# Patient Record
Sex: Female | Born: 1965
Health system: Southern US, Community
[De-identification: ages and names within clinical notes are randomized; demographics above are authoritative.]

## PROBLEM LIST (undated history)

## (undated) DIAGNOSIS — F329 Major depressive disorder, single episode, unspecified: Secondary | ICD-10-CM

## (undated) DIAGNOSIS — E118 Type 2 diabetes mellitus with unspecified complications: Secondary | ICD-10-CM

## (undated) DIAGNOSIS — E1165 Type 2 diabetes mellitus with hyperglycemia: Secondary | ICD-10-CM

## (undated) DIAGNOSIS — S52532A Colles' fracture of left radius, initial encounter for closed fracture: Secondary | ICD-10-CM

## (undated) DIAGNOSIS — J9621 Acute and chronic respiratory failure with hypoxia: Secondary | ICD-10-CM

## (undated) DIAGNOSIS — B20 Human immunodeficiency virus [HIV] disease: Secondary | ICD-10-CM

## (undated) DIAGNOSIS — R0602 Shortness of breath: Secondary | ICD-10-CM

## (undated) DIAGNOSIS — G709 Myoneural disorder, unspecified: Secondary | ICD-10-CM

## (undated) DIAGNOSIS — J449 Chronic obstructive pulmonary disease, unspecified: Secondary | ICD-10-CM

## (undated) DIAGNOSIS — M25552 Pain in left hip: Secondary | ICD-10-CM

## (undated) DIAGNOSIS — R197 Diarrhea, unspecified: Secondary | ICD-10-CM

## (undated) DIAGNOSIS — K219 Gastro-esophageal reflux disease without esophagitis: Secondary | ICD-10-CM

## (undated) DIAGNOSIS — F17200 Nicotine dependence, unspecified, uncomplicated: Secondary | ICD-10-CM

## (undated) DIAGNOSIS — M25559 Pain in unspecified hip: Secondary | ICD-10-CM

## (undated) DIAGNOSIS — R296 Repeated falls: Secondary | ICD-10-CM

## (undated) DIAGNOSIS — IMO0001 Reserved for inherently not codable concepts without codable children: Secondary | ICD-10-CM

## (undated) DIAGNOSIS — N951 Menopausal and female climacteric states: Secondary | ICD-10-CM

## (undated) DIAGNOSIS — M810 Age-related osteoporosis without current pathological fracture: Secondary | ICD-10-CM

## (undated) DIAGNOSIS — R5381 Other malaise: Secondary | ICD-10-CM

## (undated) DIAGNOSIS — E871 Hypo-osmolality and hyponatremia: Secondary | ICD-10-CM

## (undated) DIAGNOSIS — G4700 Insomnia, unspecified: Secondary | ICD-10-CM

## (undated) DIAGNOSIS — R52 Pain, unspecified: Secondary | ICD-10-CM

## (undated) DIAGNOSIS — R509 Fever, unspecified: Secondary | ICD-10-CM

## (undated) DIAGNOSIS — N179 Acute kidney failure, unspecified: Secondary | ICD-10-CM

## (undated) DIAGNOSIS — F419 Anxiety disorder, unspecified: Secondary | ICD-10-CM

## (undated) DIAGNOSIS — I10 Essential (primary) hypertension: Secondary | ICD-10-CM

## (undated) DIAGNOSIS — G609 Hereditary and idiopathic neuropathy, unspecified: Secondary | ICD-10-CM

## (undated) DIAGNOSIS — E1140 Type 2 diabetes mellitus with diabetic neuropathy, unspecified: Secondary | ICD-10-CM

## (undated) DIAGNOSIS — S72009A Fracture of unspecified part of neck of unspecified femur, initial encounter for closed fracture: Secondary | ICD-10-CM

## (undated) DIAGNOSIS — F39 Unspecified mood [affective] disorder: Secondary | ICD-10-CM

## (undated) DIAGNOSIS — E785 Hyperlipidemia, unspecified: Secondary | ICD-10-CM

## (undated) DIAGNOSIS — B370 Candidal stomatitis: Secondary | ICD-10-CM

## (undated) DIAGNOSIS — M1990 Unspecified osteoarthritis, unspecified site: Secondary | ICD-10-CM

## (undated) DIAGNOSIS — Z789 Other specified health status: Secondary | ICD-10-CM

## (undated) DIAGNOSIS — S7292XA Unspecified fracture of left femur, initial encounter for closed fracture: Secondary | ICD-10-CM

## (undated) DIAGNOSIS — N184 Chronic kidney disease, stage 4 (severe): Secondary | ICD-10-CM

## (undated) DIAGNOSIS — R5383 Other fatigue: Secondary | ICD-10-CM

## (undated) DIAGNOSIS — E11628 Type 2 diabetes mellitus with other skin complications: Secondary | ICD-10-CM

## (undated) DIAGNOSIS — I5189 Other ill-defined heart diseases: Secondary | ICD-10-CM

## (undated) DIAGNOSIS — S72002A Fracture of unspecified part of neck of left femur, initial encounter for closed fracture: Secondary | ICD-10-CM

## (undated) DIAGNOSIS — S72409A Unspecified fracture of lower end of unspecified femur, initial encounter for closed fracture: Secondary | ICD-10-CM

## (undated) DIAGNOSIS — E662 Morbid (severe) obesity with alveolar hypoventilation: Secondary | ICD-10-CM

## (undated) DIAGNOSIS — A419 Sepsis, unspecified organism: Secondary | ICD-10-CM

## (undated) DIAGNOSIS — J101 Influenza due to other identified influenza virus with other respiratory manifestations: Secondary | ICD-10-CM

## (undated) DIAGNOSIS — J189 Pneumonia, unspecified organism: Secondary | ICD-10-CM

## (undated) DIAGNOSIS — L089 Local infection of the skin and subcutaneous tissue, unspecified: Secondary | ICD-10-CM

## (undated) DIAGNOSIS — I214 Non-ST elevation (NSTEMI) myocardial infarction: Secondary | ICD-10-CM

## (undated) DIAGNOSIS — E1110 Type 2 diabetes mellitus with ketoacidosis without coma: Secondary | ICD-10-CM

## (undated) DIAGNOSIS — J45901 Unspecified asthma with (acute) exacerbation: Secondary | ICD-10-CM

## (undated) DIAGNOSIS — G9341 Metabolic encephalopathy: Secondary | ICD-10-CM

## (undated) DIAGNOSIS — F32A Depression, unspecified: Secondary | ICD-10-CM

## (undated) DIAGNOSIS — J40 Bronchitis, not specified as acute or chronic: Secondary | ICD-10-CM

## (undated) HISTORY — DX: Pain in unspecified hip: M25.559

## (undated) HISTORY — DX: Acute kidney failure, unspecified: N17.9

## (undated) HISTORY — DX: Other fatigue: R53.83

## (undated) HISTORY — DX: Acute and chronic respiratory failure with hypoxia: J96.21

## (undated) HISTORY — DX: Other malaise: R53.81

## (undated) HISTORY — DX: Local infection of the skin and subcutaneous tissue, unspecified: L08.9

## (undated) HISTORY — DX: Fever, unspecified: R50.9

## (undated) HISTORY — DX: Type 2 diabetes mellitus with hyperglycemia: E11.65

## (undated) HISTORY — DX: Other ill-defined heart diseases: I51.89

## (undated) HISTORY — DX: Type 2 diabetes mellitus with ketoacidosis without coma: E11.10

## (undated) HISTORY — DX: Menopausal and female climacteric states: N95.1

## (undated) HISTORY — DX: Pain in left hip: M25.552

## (undated) HISTORY — DX: Human immunodeficiency virus (HIV) disease: B20

## (undated) HISTORY — DX: Other specified health status: Z78.9

## (undated) HISTORY — DX: Unspecified fracture of lower end of unspecified femur, initial encounter for closed fracture: S72.409A

## (undated) HISTORY — DX: Essential (primary) hypertension: I10

## (undated) HISTORY — DX: Fracture of unspecified part of neck of unspecified femur, initial encounter for closed fracture: S72.009A

## (undated) HISTORY — DX: Type 2 diabetes mellitus with unspecified complications: E11.8

## (undated) HISTORY — DX: Hereditary and idiopathic neuropathy, unspecified: G60.9

## (undated) HISTORY — DX: Insomnia, unspecified: G47.00

## (undated) HISTORY — DX: Fracture of unspecified part of neck of left femur, initial encounter for closed fracture: S72.002A

## (undated) HISTORY — DX: Unspecified mood (affective) disorder: F39

## (undated) HISTORY — DX: Non-ST elevation (NSTEMI) myocardial infarction: I21.4

## (undated) HISTORY — DX: Unspecified asthma with (acute) exacerbation: J45.901

## (undated) HISTORY — DX: Metabolic encephalopathy: G93.41

## (undated) HISTORY — DX: Influenza due to other identified influenza virus with other respiratory manifestations: J10.1

## (undated) HISTORY — DX: Type 2 diabetes mellitus with other skin complications: E11.628

## (undated) HISTORY — DX: Unspecified fracture of left femur, initial encounter for closed fracture: S72.92XA

## (undated) HISTORY — DX: Morbid (severe) obesity with alveolar hypoventilation: E66.2

## (undated) HISTORY — DX: Depression, unspecified: F32.A

## (undated) HISTORY — DX: Type 2 diabetes mellitus with diabetic neuropathy, unspecified: E11.40

## (undated) HISTORY — DX: Repeated falls: R29.6

## (undated) HISTORY — DX: Hyperlipidemia, unspecified: E78.5

## (undated) HISTORY — DX: Age-related osteoporosis without current pathological fracture: M81.0

## (undated) HISTORY — DX: Nicotine dependence, unspecified, uncomplicated: F17.200

## (undated) HISTORY — DX: Sepsis, unspecified organism: A41.9

## (undated) HISTORY — DX: Major depressive disorder, single episode, unspecified: F32.9

## (undated) HISTORY — DX: Pneumonia, unspecified organism: J18.9

## (undated) HISTORY — DX: Colles' fracture of left radius, initial encounter for closed fracture: S52.532A

## (undated) HISTORY — DX: Bronchitis, not specified as acute or chronic: J40

## (undated) HISTORY — DX: Hypo-osmolality and hyponatremia: E87.1

## (undated) HISTORY — DX: Reserved for inherently not codable concepts without codable children: IMO0001

## (undated) HISTORY — DX: Shortness of breath: R06.02

## (undated) HISTORY — DX: Anxiety disorder, unspecified: F41.9

## (undated) HISTORY — DX: Diarrhea, unspecified: R19.7

## (undated) HISTORY — DX: Candidal stomatitis: B37.0

## (undated) HISTORY — DX: Chronic kidney disease, stage 4 (severe): N18.4

---

## 1997-05-03 ENCOUNTER — Ambulatory Visit (HOSPITAL_COMMUNITY): Admission: RE | Admit: 1997-05-03 | Discharge: 1997-05-03 | Payer: Self-pay | Admitting: Neurosurgery

## 1998-01-01 DIAGNOSIS — Z21 Asymptomatic human immunodeficiency virus [HIV] infection status: Secondary | ICD-10-CM

## 1998-01-01 DIAGNOSIS — B20 Human immunodeficiency virus [HIV] disease: Secondary | ICD-10-CM

## 1998-01-01 HISTORY — DX: Asymptomatic human immunodeficiency virus (hiv) infection status: Z21

## 1998-01-01 HISTORY — DX: Human immunodeficiency virus (HIV) disease: B20

## 1999-11-20 ENCOUNTER — Ambulatory Visit (HOSPITAL_COMMUNITY): Admission: RE | Admit: 1999-11-20 | Discharge: 1999-11-20 | Payer: Self-pay | Admitting: Infectious Diseases

## 1999-11-20 ENCOUNTER — Encounter: Admission: RE | Admit: 1999-11-20 | Discharge: 1999-11-20 | Payer: Self-pay | Admitting: Infectious Diseases

## 1999-11-20 ENCOUNTER — Encounter (INDEPENDENT_AMBULATORY_CARE_PROVIDER_SITE_OTHER): Payer: Self-pay | Admitting: *Deleted

## 1999-11-20 LAB — CONVERTED CEMR LAB
CD4 Count: 1150 microliters
CD4 T Cell Abs: 1150

## 1999-12-13 ENCOUNTER — Ambulatory Visit (HOSPITAL_COMMUNITY): Admission: RE | Admit: 1999-12-13 | Discharge: 1999-12-13 | Payer: Self-pay | Admitting: Infectious Diseases

## 1999-12-13 ENCOUNTER — Encounter: Admission: RE | Admit: 1999-12-13 | Discharge: 1999-12-13 | Payer: Self-pay | Admitting: Infectious Diseases

## 2000-03-06 ENCOUNTER — Encounter: Admission: RE | Admit: 2000-03-06 | Discharge: 2000-03-06 | Payer: Self-pay | Admitting: Infectious Diseases

## 2000-04-17 ENCOUNTER — Encounter: Admission: RE | Admit: 2000-04-17 | Discharge: 2000-04-17 | Payer: Self-pay | Admitting: Infectious Diseases

## 2000-04-17 ENCOUNTER — Ambulatory Visit (HOSPITAL_COMMUNITY): Admission: RE | Admit: 2000-04-17 | Discharge: 2000-04-17 | Payer: Self-pay | Admitting: Infectious Diseases

## 2000-06-17 ENCOUNTER — Encounter: Admission: RE | Admit: 2000-06-17 | Discharge: 2000-06-17 | Payer: Self-pay | Admitting: Internal Medicine

## 2000-11-04 ENCOUNTER — Encounter: Admission: RE | Admit: 2000-11-04 | Discharge: 2000-11-04 | Payer: Self-pay | Admitting: Infectious Diseases

## 2000-11-04 ENCOUNTER — Ambulatory Visit (HOSPITAL_COMMUNITY): Admission: RE | Admit: 2000-11-04 | Discharge: 2000-11-04 | Payer: Self-pay | Admitting: Infectious Diseases

## 2000-12-04 ENCOUNTER — Encounter: Admission: RE | Admit: 2000-12-04 | Discharge: 2000-12-04 | Payer: Self-pay | Admitting: Internal Medicine

## 2000-12-18 ENCOUNTER — Encounter: Admission: RE | Admit: 2000-12-18 | Discharge: 2000-12-18 | Payer: Self-pay | Admitting: Internal Medicine

## 2001-02-19 ENCOUNTER — Encounter: Admission: RE | Admit: 2001-02-19 | Discharge: 2001-02-19 | Payer: Self-pay | Admitting: Internal Medicine

## 2001-03-03 ENCOUNTER — Encounter: Admission: RE | Admit: 2001-03-03 | Discharge: 2001-03-03 | Payer: Self-pay | Admitting: Internal Medicine

## 2001-03-27 ENCOUNTER — Encounter: Admission: RE | Admit: 2001-03-27 | Discharge: 2001-03-27 | Payer: Self-pay | Admitting: Internal Medicine

## 2001-04-16 ENCOUNTER — Encounter: Admission: RE | Admit: 2001-04-16 | Discharge: 2001-04-16 | Payer: Self-pay | Admitting: Internal Medicine

## 2001-04-24 ENCOUNTER — Encounter: Admission: RE | Admit: 2001-04-24 | Discharge: 2001-04-24 | Payer: Self-pay | Admitting: Internal Medicine

## 2001-04-24 ENCOUNTER — Encounter: Payer: Self-pay | Admitting: Internal Medicine

## 2001-04-24 ENCOUNTER — Ambulatory Visit (HOSPITAL_COMMUNITY): Admission: RE | Admit: 2001-04-24 | Discharge: 2001-04-24 | Payer: Self-pay | Admitting: Internal Medicine

## 2001-10-15 ENCOUNTER — Encounter: Admission: RE | Admit: 2001-10-15 | Discharge: 2001-10-15 | Payer: Self-pay | Admitting: Internal Medicine

## 2001-11-05 ENCOUNTER — Encounter: Admission: RE | Admit: 2001-11-05 | Discharge: 2001-11-05 | Payer: Self-pay | Admitting: Infectious Diseases

## 2001-11-05 ENCOUNTER — Ambulatory Visit (HOSPITAL_COMMUNITY): Admission: RE | Admit: 2001-11-05 | Discharge: 2001-11-05 | Payer: Self-pay | Admitting: Infectious Diseases

## 2001-11-07 ENCOUNTER — Encounter: Admission: RE | Admit: 2001-11-07 | Discharge: 2001-11-07 | Payer: Self-pay | Admitting: Internal Medicine

## 2001-11-19 ENCOUNTER — Encounter: Admission: RE | Admit: 2001-11-19 | Discharge: 2001-11-19 | Payer: Self-pay | Admitting: Infectious Diseases

## 2002-01-08 ENCOUNTER — Encounter: Admission: RE | Admit: 2002-01-08 | Discharge: 2002-01-08 | Payer: Self-pay | Admitting: Internal Medicine

## 2002-07-14 ENCOUNTER — Encounter: Admission: RE | Admit: 2002-07-14 | Discharge: 2002-07-14 | Payer: Self-pay | Admitting: Internal Medicine

## 2002-08-06 ENCOUNTER — Ambulatory Visit (HOSPITAL_COMMUNITY): Admission: RE | Admit: 2002-08-06 | Discharge: 2002-08-06 | Payer: Self-pay | Admitting: Infectious Diseases

## 2002-08-06 ENCOUNTER — Encounter: Payer: Self-pay | Admitting: Infectious Diseases

## 2002-08-06 ENCOUNTER — Encounter: Admission: RE | Admit: 2002-08-06 | Discharge: 2002-08-06 | Payer: Self-pay | Admitting: Infectious Diseases

## 2002-08-20 ENCOUNTER — Encounter: Admission: RE | Admit: 2002-08-20 | Discharge: 2002-08-20 | Payer: Self-pay | Admitting: Infectious Diseases

## 2002-12-16 ENCOUNTER — Encounter: Payer: Self-pay | Admitting: Infectious Diseases

## 2002-12-16 ENCOUNTER — Ambulatory Visit (HOSPITAL_COMMUNITY): Admission: RE | Admit: 2002-12-16 | Discharge: 2002-12-16 | Payer: Self-pay | Admitting: Infectious Diseases

## 2002-12-16 ENCOUNTER — Encounter: Admission: RE | Admit: 2002-12-16 | Discharge: 2002-12-16 | Payer: Self-pay | Admitting: Infectious Diseases

## 2003-01-29 ENCOUNTER — Encounter: Admission: RE | Admit: 2003-01-29 | Discharge: 2003-01-29 | Payer: Self-pay | Admitting: Internal Medicine

## 2003-02-12 ENCOUNTER — Ambulatory Visit (HOSPITAL_COMMUNITY): Admission: RE | Admit: 2003-02-12 | Discharge: 2003-02-12 | Payer: Self-pay | Admitting: Internal Medicine

## 2003-03-03 ENCOUNTER — Encounter: Admission: RE | Admit: 2003-03-03 | Discharge: 2003-03-03 | Payer: Self-pay | Admitting: Internal Medicine

## 2003-03-17 ENCOUNTER — Ambulatory Visit (HOSPITAL_COMMUNITY): Admission: RE | Admit: 2003-03-17 | Discharge: 2003-03-17 | Payer: Self-pay | Admitting: Infectious Diseases

## 2003-03-17 ENCOUNTER — Encounter: Admission: RE | Admit: 2003-03-17 | Discharge: 2003-03-17 | Payer: Self-pay | Admitting: Infectious Diseases

## 2003-03-17 ENCOUNTER — Encounter (INDEPENDENT_AMBULATORY_CARE_PROVIDER_SITE_OTHER): Payer: Self-pay | Admitting: *Deleted

## 2003-03-17 LAB — CONVERTED CEMR LAB
CD4 Count: 680 microliters
HIV 1 RNA Quant: 8770 copies/mL

## 2003-03-18 ENCOUNTER — Encounter: Admission: RE | Admit: 2003-03-18 | Discharge: 2003-03-18 | Payer: Self-pay | Admitting: Infectious Diseases

## 2003-04-01 ENCOUNTER — Encounter: Admission: RE | Admit: 2003-04-01 | Discharge: 2003-04-01 | Payer: Self-pay | Admitting: Internal Medicine

## 2003-04-12 ENCOUNTER — Encounter: Admission: RE | Admit: 2003-04-12 | Discharge: 2003-04-12 | Payer: Self-pay | Admitting: Internal Medicine

## 2003-05-10 ENCOUNTER — Emergency Department (HOSPITAL_COMMUNITY): Admission: EM | Admit: 2003-05-10 | Discharge: 2003-05-10 | Payer: Self-pay | Admitting: Family Medicine

## 2003-11-04 ENCOUNTER — Ambulatory Visit: Payer: Self-pay | Admitting: Infectious Diseases

## 2003-11-04 ENCOUNTER — Ambulatory Visit (HOSPITAL_COMMUNITY): Admission: RE | Admit: 2003-11-04 | Discharge: 2003-11-04 | Payer: Self-pay | Admitting: Infectious Diseases

## 2003-11-04 ENCOUNTER — Encounter (INDEPENDENT_AMBULATORY_CARE_PROVIDER_SITE_OTHER): Payer: Self-pay | Admitting: *Deleted

## 2003-11-04 LAB — CONVERTED CEMR LAB
CD4 Count: 760 microliters
HIV 1 RNA Quant: 13900 copies/mL

## 2003-11-17 ENCOUNTER — Ambulatory Visit: Payer: Self-pay | Admitting: Infectious Diseases

## 2003-12-10 ENCOUNTER — Ambulatory Visit: Payer: Self-pay | Admitting: Infectious Diseases

## 2004-04-07 ENCOUNTER — Ambulatory Visit: Payer: Self-pay | Admitting: Internal Medicine

## 2004-04-10 ENCOUNTER — Ambulatory Visit: Payer: Self-pay | Admitting: Internal Medicine

## 2004-04-27 ENCOUNTER — Ambulatory Visit: Payer: Self-pay | Admitting: Internal Medicine

## 2004-06-14 ENCOUNTER — Ambulatory Visit: Payer: Self-pay | Admitting: Internal Medicine

## 2004-07-14 ENCOUNTER — Ambulatory Visit: Payer: Self-pay | Admitting: Infectious Diseases

## 2004-08-28 ENCOUNTER — Ambulatory Visit: Payer: Self-pay | Admitting: Internal Medicine

## 2005-08-01 ENCOUNTER — Encounter: Payer: Self-pay | Admitting: Internal Medicine

## 2005-08-01 LAB — CONVERTED CEMR LAB: Pap Smear: NEGATIVE

## 2006-01-28 ENCOUNTER — Ambulatory Visit: Payer: Self-pay | Admitting: Internal Medicine

## 2006-01-28 LAB — CONVERTED CEMR LAB
Hep B S Ab: NEGATIVE
Hepatitis B Surface Ag: NEGATIVE

## 2006-01-29 ENCOUNTER — Ambulatory Visit: Payer: Self-pay | Admitting: *Deleted

## 2006-02-25 ENCOUNTER — Ambulatory Visit: Payer: Self-pay | Admitting: Internal Medicine

## 2006-02-25 ENCOUNTER — Encounter (INDEPENDENT_AMBULATORY_CARE_PROVIDER_SITE_OTHER): Payer: Self-pay | Admitting: *Deleted

## 2006-02-25 LAB — CONVERTED CEMR LAB: Pap Smear: NORMAL

## 2006-03-04 ENCOUNTER — Ambulatory Visit: Payer: Self-pay | Admitting: Internal Medicine

## 2006-03-10 ENCOUNTER — Encounter (INDEPENDENT_AMBULATORY_CARE_PROVIDER_SITE_OTHER): Payer: Self-pay | Admitting: *Deleted

## 2006-08-22 ENCOUNTER — Encounter (INDEPENDENT_AMBULATORY_CARE_PROVIDER_SITE_OTHER): Payer: Self-pay | Admitting: *Deleted

## 2006-08-22 ENCOUNTER — Ambulatory Visit: Payer: Self-pay | Admitting: Internal Medicine

## 2006-08-22 LAB — CONVERTED CEMR LAB
Albumin: 4.5 g/dL (ref 3.5–5.2)
CD4 Count: 347 uL
CO2: 19 meq/L (ref 19–32)
Cholesterol: 330 mg/dL — ABNORMAL HIGH (ref 0–200)
Eosinophils Relative: 3 % (ref 0–5)
Glucose, Bld: 108 mg/dL — ABNORMAL HIGH (ref 70–99)
HCT: 37.3 % (ref 36.0–46.0)
HIV 1 RNA Quant: 41600 {copies}/mL
Hemoglobin: 13.1 g/dL (ref 12.0–15.0)
Lymphocytes Relative: 33 % (ref 12–46)
Lymphs Abs: 1.4 10*3/uL (ref 0.7–3.3)
Monocytes Relative: 9 % (ref 3–11)
Platelets: 207 10*3/uL (ref 150–400)
RBC: 4.12 M/uL (ref 3.87–5.11)
Sodium: 139 meq/L (ref 135–145)
Total Bilirubin: 0.3 mg/dL (ref 0.3–1.2)
Total Protein: 7.6 g/dL (ref 6.0–8.3)
Triglycerides: 1014 mg/dL — ABNORMAL HIGH (ref <150)
WBC: 4.2 10*3/uL (ref 4.0–10.5)

## 2006-09-18 ENCOUNTER — Encounter (INDEPENDENT_AMBULATORY_CARE_PROVIDER_SITE_OTHER): Payer: Self-pay | Admitting: *Deleted

## 2007-02-20 ENCOUNTER — Ambulatory Visit: Payer: Self-pay | Admitting: Internal Medicine

## 2007-03-10 ENCOUNTER — Encounter (INDEPENDENT_AMBULATORY_CARE_PROVIDER_SITE_OTHER): Payer: Self-pay | Admitting: *Deleted

## 2007-03-31 ENCOUNTER — Encounter: Payer: Self-pay | Admitting: Internal Medicine

## 2007-03-31 ENCOUNTER — Ambulatory Visit: Payer: Self-pay | Admitting: Internal Medicine

## 2007-03-31 LAB — CONVERTED CEMR LAB
Albumin: 4.5 g/dL (ref 3.5–5.2)
Alkaline Phosphatase: 56 units/L (ref 39–117)
BUN: 20 mg/dL (ref 6–23)
Eosinophils Absolute: 0.2 10*3/uL (ref 0.0–0.7)
Eosinophils Relative: 4 % (ref 0–5)
Glucose, Bld: 211 mg/dL — ABNORMAL HIGH (ref 70–99)
HCT: 36.6 % (ref 36.0–46.0)
HIV 1 RNA Quant: 132000 copies/mL — ABNORMAL HIGH (ref <50)
HIV-1 RNA Quant, Log: 5.12 — ABNORMAL HIGH (ref <1.70)
Lymphs Abs: 1 10*3/uL (ref 0.7–4.0)
MCV: 96.1 fL (ref 78.0–100.0)
Monocytes Relative: 10 % (ref 3–12)
RBC: 3.81 M/uL — ABNORMAL LOW (ref 3.87–5.11)
Total Bilirubin: 0.2 mg/dL — ABNORMAL LOW (ref 0.3–1.2)
Total Lymphocytes %: 23 % (ref 12–46)
Total lymphocyte count: 1012 cells/mcL (ref 700–3300)
WBC: 4.4 10*3/uL (ref 4.0–10.5)

## 2007-04-03 ENCOUNTER — Encounter (INDEPENDENT_AMBULATORY_CARE_PROVIDER_SITE_OTHER): Payer: Self-pay | Admitting: *Deleted

## 2007-04-03 ENCOUNTER — Encounter: Payer: Self-pay | Admitting: Internal Medicine

## 2007-04-03 ENCOUNTER — Ambulatory Visit: Payer: Self-pay | Admitting: Internal Medicine

## 2007-04-03 LAB — CONVERTED CEMR LAB
CD4 T Helper %: 7 % — ABNORMAL LOW (ref 32–62)
HIV-1 RNA Quant, Log: 5.32 — ABNORMAL HIGH (ref <1.70)
Total Lymphocytes %: 25 % (ref 12–46)
Total lymphocyte count: 925 cells/mcL (ref 700–3300)

## 2007-04-17 ENCOUNTER — Ambulatory Visit: Payer: Self-pay | Admitting: Internal Medicine

## 2007-05-22 ENCOUNTER — Ambulatory Visit: Payer: Self-pay | Admitting: Internal Medicine

## 2007-05-22 LAB — CONVERTED CEMR LAB
ALT: 12 units/L (ref 0–35)
AST: 15 units/L (ref 0–37)
Absolute CD4: 91 #/uL — ABNORMAL LOW (ref 381–1469)
Alkaline Phosphatase: 49 units/L (ref 39–117)
Basophils Absolute: 0 10*3/uL (ref 0.0–0.1)
Basophils Relative: 1 % (ref 0–1)
CD4 T Helper %: 13 % — ABNORMAL LOW (ref 32–62)
CO2: 18 meq/L — ABNORMAL LOW (ref 19–32)
Eosinophils Absolute: 0.2 10*3/uL (ref 0.0–0.7)
Eosinophils Relative: 3 % (ref 0–5)
GC Probe Amp, Urine: NEGATIVE
HCT: 35.4 % — ABNORMAL LOW (ref 36.0–46.0)
Lymphocytes Relative: 14 % (ref 12–46)
MCHC: 31.9 g/dL (ref 30.0–36.0)
Platelets: 205 10*3/uL (ref 150–400)
RDW: 15.1 % (ref 11.5–15.5)
Sodium: 130 meq/L — ABNORMAL LOW (ref 135–145)
Total Bilirubin: 0.3 mg/dL (ref 0.3–1.2)
Total Lymphocytes %: 14 % (ref 12–46)
Total Protein: 8.3 g/dL (ref 6.0–8.3)
Total lymphocyte count: 700 cells/mcL (ref 700–3300)
WBC, lymph enumeration: 5 10*3/uL (ref 4.0–10.5)

## 2007-06-05 ENCOUNTER — Ambulatory Visit: Payer: Self-pay | Admitting: Internal Medicine

## 2007-06-12 ENCOUNTER — Ambulatory Visit: Payer: Self-pay | Admitting: Internal Medicine

## 2007-06-26 ENCOUNTER — Ambulatory Visit: Payer: Self-pay | Admitting: Internal Medicine

## 2007-07-17 ENCOUNTER — Ambulatory Visit: Payer: Self-pay | Admitting: Internal Medicine

## 2007-08-14 ENCOUNTER — Encounter (INDEPENDENT_AMBULATORY_CARE_PROVIDER_SITE_OTHER): Payer: Self-pay | Admitting: *Deleted

## 2007-08-14 ENCOUNTER — Ambulatory Visit: Payer: Self-pay | Admitting: Internal Medicine

## 2007-08-14 ENCOUNTER — Encounter: Payer: Self-pay | Admitting: Internal Medicine

## 2007-08-14 LAB — CONVERTED CEMR LAB
ALT: 13 units/L (ref 0–35)
Albumin: 4.2 g/dL (ref 3.5–5.2)
Alkaline Phosphatase: 48 units/L (ref 39–117)
Basophils Absolute: 0 10*3/uL (ref 0.0–0.1)
CD4 Count: 129 microliters
CD4 T Helper %: 11 % — ABNORMAL LOW (ref 32–62)
CO2: 21 meq/L (ref 19–32)
Eosinophils Absolute: 0.2 10*3/uL (ref 0.0–0.7)
Glucose, Bld: 124 mg/dL — ABNORMAL HIGH (ref 70–99)
HIV-1 RNA Quant, Log: 2.12 — ABNORMAL HIGH (ref <1.70)
Lymphocytes Relative: 24 % (ref 12–46)
Lymphs Abs: 1.2 10*3/uL (ref 0.7–4.0)
Neutrophils Relative %: 65 % (ref 43–77)
Platelets: 279 10*3/uL (ref 150–400)
Potassium: 4.9 meq/L (ref 3.5–5.3)
RDW: 14.3 % (ref 11.5–15.5)
Sodium: 136 meq/L (ref 135–145)
Total Bilirubin: 0.2 mg/dL — ABNORMAL LOW (ref 0.3–1.2)
Total Protein: 7.5 g/dL (ref 6.0–8.3)
Total lymphocyte count: 1176 cells/mcL (ref 700–3300)
WBC: 4.9 10*3/uL (ref 4.0–10.5)

## 2007-08-28 ENCOUNTER — Ambulatory Visit: Payer: Self-pay | Admitting: Internal Medicine

## 2007-09-04 ENCOUNTER — Ambulatory Visit: Payer: Self-pay | Admitting: Internal Medicine

## 2007-10-09 ENCOUNTER — Ambulatory Visit: Payer: Self-pay | Admitting: Internal Medicine

## 2007-10-16 ENCOUNTER — Ambulatory Visit: Payer: Self-pay | Admitting: Internal Medicine

## 2007-10-23 ENCOUNTER — Ambulatory Visit: Payer: Self-pay | Admitting: Internal Medicine

## 2007-11-06 ENCOUNTER — Ambulatory Visit: Payer: Self-pay | Admitting: Internal Medicine

## 2007-11-26 ENCOUNTER — Ambulatory Visit: Payer: Self-pay | Admitting: Internal Medicine

## 2007-11-27 ENCOUNTER — Encounter: Payer: Self-pay | Admitting: Internal Medicine

## 2007-11-27 ENCOUNTER — Encounter (INDEPENDENT_AMBULATORY_CARE_PROVIDER_SITE_OTHER): Payer: Self-pay | Admitting: *Deleted

## 2007-11-27 LAB — CONVERTED CEMR LAB
AST: 11 units/L (ref 0–37)
Absolute CD4: 254 #/uL — ABNORMAL LOW (ref 381–1469)
Albumin: 4.2 g/dL (ref 3.5–5.2)
BUN: 20 mg/dL (ref 6–23)
CO2: 18 meq/L — ABNORMAL LOW (ref 19–32)
Calcium: 8.8 mg/dL (ref 8.4–10.5)
Chloride: 100 meq/L (ref 96–112)
Creatinine, Ser: 1.04 mg/dL (ref 0.40–1.20)
Glucose, Bld: 172 mg/dL — ABNORMAL HIGH (ref 70–99)
HIV 1 RNA Quant: <48 copies/mL (ref <48)
HIV-1 RNA Quant, Log: <1.68 (ref <1.68)
Hemoglobin: 11.7 g/dL — ABNORMAL LOW (ref 12.0–15.0)
Lymphocytes Relative: 26 % (ref 12–46)
Lymphs Abs: 1.6 10*3/uL (ref 0.7–4.0)
MCHC: 31 g/dL (ref 30.0–36.0)
Monocytes Absolute: 0.4 10*3/uL (ref 0.1–1.0)
Monocytes Relative: 6 % (ref 3–12)
Neutro Abs: 3.8 10*3/uL (ref 1.7–7.7)
Neutrophils Relative %: 63 % (ref 43–77)
Potassium: 4.6 meq/L (ref 3.5–5.3)
RBC: 4.03 M/uL (ref 3.87–5.11)
Total Lymphocytes %: 26 % (ref 12–46)
WBC, lymph enumeration: 6.1 10*3/uL (ref 4.0–10.5)
WBC: 6.1 10*3/uL (ref 4.0–10.5)

## 2007-12-04 ENCOUNTER — Ambulatory Visit: Payer: Self-pay | Admitting: Internal Medicine

## 2008-04-19 ENCOUNTER — Encounter (INDEPENDENT_AMBULATORY_CARE_PROVIDER_SITE_OTHER): Payer: Self-pay | Admitting: *Deleted

## 2008-04-19 ENCOUNTER — Ambulatory Visit: Payer: Self-pay | Admitting: Internal Medicine

## 2008-04-19 ENCOUNTER — Encounter: Payer: Self-pay | Admitting: Internal Medicine

## 2008-04-19 DIAGNOSIS — I10 Essential (primary) hypertension: Secondary | ICD-10-CM

## 2008-04-19 DIAGNOSIS — E118 Type 2 diabetes mellitus with unspecified complications: Secondary | ICD-10-CM

## 2008-04-19 DIAGNOSIS — E1159 Type 2 diabetes mellitus with other circulatory complications: Secondary | ICD-10-CM

## 2008-04-19 DIAGNOSIS — F39 Unspecified mood [affective] disorder: Secondary | ICD-10-CM

## 2008-04-19 DIAGNOSIS — G609 Hereditary and idiopathic neuropathy, unspecified: Secondary | ICD-10-CM

## 2008-04-19 DIAGNOSIS — IMO0002 Reserved for concepts with insufficient information to code with codable children: Secondary | ICD-10-CM

## 2008-04-19 DIAGNOSIS — B20 Human immunodeficiency virus [HIV] disease: Secondary | ICD-10-CM

## 2008-04-19 DIAGNOSIS — E1165 Type 2 diabetes mellitus with hyperglycemia: Secondary | ICD-10-CM

## 2008-04-19 HISTORY — DX: Type 2 diabetes mellitus with hyperglycemia: E11.65

## 2008-04-19 HISTORY — DX: Hereditary and idiopathic neuropathy, unspecified: G60.9

## 2008-04-19 HISTORY — DX: Unspecified mood (affective) disorder: F39

## 2008-04-19 HISTORY — DX: Human immunodeficiency virus (HIV) disease: B20

## 2008-04-19 HISTORY — DX: Reserved for concepts with insufficient information to code with codable children: IMO0002

## 2008-04-19 HISTORY — DX: Essential (primary) hypertension: I10

## 2008-04-19 LAB — CONVERTED CEMR LAB
ALT: 8 units/L (ref 0–35)
CD4 Count: 199 microliters
CO2: 23 meq/L (ref 19–32)
Calcium: 8.9 mg/dL (ref 8.4–10.5)
Chloride: 104 meq/L (ref 96–112)
Eosinophils Absolute: 0.2 10*3/uL (ref 0.0–0.7)
Glucose, Bld: 153 mg/dL — ABNORMAL HIGH (ref 70–99)
HIV 1 RNA Quant: 85 copies/mL — ABNORMAL HIGH (ref <48)
Hemoglobin: 10.6 g/dL — ABNORMAL LOW (ref 12.0–15.0)
Lymphs Abs: 1.4 10*3/uL (ref 0.7–4.0)
MCV: 92.2 fL (ref 78.0–100.0)
Monocytes Absolute: 0.5 10*3/uL (ref 0.1–1.0)
Monocytes Relative: 7 % (ref 3–12)
Neutro Abs: 5.7 10*3/uL (ref 1.7–7.7)
Neutrophils Relative %: 72 % (ref 43–77)
RBC: 3.7 M/uL — ABNORMAL LOW (ref 3.87–5.11)
Sodium: 140 meq/L (ref 135–145)
Total Protein: 7 g/dL (ref 6.0–8.3)
WBC: 7.9 10*3/uL (ref 4.0–10.5)

## 2008-05-11 ENCOUNTER — Telehealth: Payer: Self-pay | Admitting: Internal Medicine

## 2008-05-17 ENCOUNTER — Telehealth: Payer: Self-pay | Admitting: Internal Medicine

## 2008-05-19 ENCOUNTER — Encounter: Payer: Self-pay | Admitting: Internal Medicine

## 2008-05-20 ENCOUNTER — Encounter: Payer: Self-pay | Admitting: Internal Medicine

## 2008-05-20 ENCOUNTER — Ambulatory Visit: Payer: Self-pay | Admitting: Internal Medicine

## 2008-05-20 DIAGNOSIS — B370 Candidal stomatitis: Secondary | ICD-10-CM

## 2008-05-20 HISTORY — DX: Candidal stomatitis: B37.0

## 2008-06-22 ENCOUNTER — Telehealth: Payer: Self-pay | Admitting: Internal Medicine

## 2008-07-08 ENCOUNTER — Ambulatory Visit: Payer: Self-pay | Admitting: Internal Medicine

## 2008-07-08 ENCOUNTER — Encounter (INDEPENDENT_AMBULATORY_CARE_PROVIDER_SITE_OTHER): Payer: Self-pay | Admitting: *Deleted

## 2008-07-08 LAB — CONVERTED CEMR LAB
ALT: 12 units/L (ref 0–35)
Absolute CD4: 287 #/uL — ABNORMAL LOW (ref 381–1469)
BUN: 28 mg/dL — ABNORMAL HIGH (ref 6–23)
Basophils Relative: 1 % (ref 0–1)
CD4 T Helper %: 14 % — ABNORMAL LOW (ref 32–62)
CO2: 22 meq/L (ref 19–32)
Creatinine, Ser: 1.14 mg/dL (ref 0.40–1.20)
Eosinophils Absolute: 0.3 10*3/uL (ref 0.0–0.7)
Eosinophils Relative: 3 % (ref 0–5)
HIV 1 RNA Quant: 452 copies/mL — ABNORMAL HIGH (ref <48)
MCHC: 32.2 g/dL (ref 30.0–36.0)
MCV: 86.3 fL (ref 78.0–100.0)
Monocytes Relative: 5 % (ref 3–12)
Neutrophils Relative %: 68 % (ref 43–77)
Platelets: 433 10*3/uL — ABNORMAL HIGH (ref 150–400)
Total Bilirubin: 0.2 mg/dL — ABNORMAL LOW (ref 0.3–1.2)
Total lymphocyte count: 2047 cells/mcL (ref 700–3300)

## 2008-07-12 ENCOUNTER — Ambulatory Visit: Payer: Self-pay | Admitting: Internal Medicine

## 2008-07-12 ENCOUNTER — Encounter: Payer: Self-pay | Admitting: Internal Medicine

## 2008-07-12 DIAGNOSIS — R5383 Other fatigue: Secondary | ICD-10-CM

## 2008-07-12 DIAGNOSIS — R5381 Other malaise: Secondary | ICD-10-CM

## 2008-07-12 HISTORY — DX: Other malaise: R53.81

## 2008-07-19 ENCOUNTER — Telehealth: Payer: Self-pay | Admitting: Internal Medicine

## 2008-07-23 ENCOUNTER — Telehealth: Payer: Self-pay | Admitting: Internal Medicine

## 2008-08-23 ENCOUNTER — Encounter: Payer: Self-pay | Admitting: Internal Medicine

## 2008-08-23 ENCOUNTER — Encounter (INDEPENDENT_AMBULATORY_CARE_PROVIDER_SITE_OTHER): Payer: Self-pay | Admitting: *Deleted

## 2008-08-23 ENCOUNTER — Ambulatory Visit: Payer: Self-pay | Admitting: Internal Medicine

## 2008-08-23 LAB — CONVERTED CEMR LAB
ALT: 12 units/L (ref 0–35)
AST: 9 units/L (ref 0–37)
Absolute CD4: 310 #/uL — ABNORMAL LOW (ref 381–1469)
Alkaline Phosphatase: 81 units/L (ref 39–117)
Basophils Absolute: 0.1 10*3/uL (ref 0.0–0.1)
Basophils Relative: 1 % (ref 0–1)
CD4 Count: 310 microliters
CD4 T Helper %: 15 % — ABNORMAL LOW (ref 32–62)
Creatinine, Ser: 1.22 mg/dL — ABNORMAL HIGH (ref 0.40–1.20)
Eosinophils Absolute: 0.2 10*3/uL (ref 0.0–0.7)
HIV 1 RNA Quant: <48 copies/mL (ref <48)
MCHC: 33.5 g/dL (ref 30.0–36.0)
MCV: 83.5 fL (ref 78.0–100.0)
Monocytes Relative: 5 % (ref 3–12)
Neutro Abs: 5.8 10*3/uL (ref 1.7–7.7)
Neutrophils Relative %: 67 % (ref 43–77)
Platelets: 494 10*3/uL — ABNORMAL HIGH (ref 150–400)
RBC: 4.07 M/uL (ref 3.87–5.11)
RDW: 16.5 % — ABNORMAL HIGH (ref 11.5–15.5)
Sodium: 133 meq/L — ABNORMAL LOW (ref 135–145)
Total Bilirubin: 0.2 mg/dL — ABNORMAL LOW (ref 0.3–1.2)
Total Protein: 7.5 g/dL (ref 6.0–8.3)

## 2008-08-30 ENCOUNTER — Ambulatory Visit: Payer: Self-pay | Admitting: Internal Medicine

## 2008-08-30 ENCOUNTER — Encounter: Payer: Self-pay | Admitting: Internal Medicine

## 2008-08-30 DIAGNOSIS — R197 Diarrhea, unspecified: Secondary | ICD-10-CM

## 2008-08-30 HISTORY — DX: Diarrhea, unspecified: R19.7

## 2008-08-30 LAB — CONVERTED CEMR LAB: Blood Glucose, Fingerstick: 243

## 2008-10-19 ENCOUNTER — Telehealth: Payer: Self-pay | Admitting: Internal Medicine

## 2008-11-15 ENCOUNTER — Encounter: Payer: Self-pay | Admitting: Internal Medicine

## 2008-11-29 ENCOUNTER — Ambulatory Visit: Payer: Self-pay | Admitting: Internal Medicine

## 2008-11-29 ENCOUNTER — Encounter: Payer: Self-pay | Admitting: Internal Medicine

## 2008-11-29 DIAGNOSIS — J209 Acute bronchitis, unspecified: Secondary | ICD-10-CM

## 2008-11-29 DIAGNOSIS — J449 Chronic obstructive pulmonary disease, unspecified: Secondary | ICD-10-CM | POA: Insufficient documentation

## 2008-11-29 DIAGNOSIS — J440 Chronic obstructive pulmonary disease with acute lower respiratory infection: Secondary | ICD-10-CM

## 2008-12-03 ENCOUNTER — Emergency Department (HOSPITAL_COMMUNITY): Admission: EM | Admit: 2008-12-03 | Discharge: 2008-12-03 | Payer: Self-pay | Admitting: Emergency Medicine

## 2008-12-20 ENCOUNTER — Ambulatory Visit: Payer: Self-pay | Admitting: Internal Medicine

## 2008-12-20 ENCOUNTER — Encounter (INDEPENDENT_AMBULATORY_CARE_PROVIDER_SITE_OTHER): Payer: Self-pay | Admitting: *Deleted

## 2008-12-20 LAB — CONVERTED CEMR LAB
ALT: 15 units/L (ref 0–35)
AST: 14 units/L (ref 0–37)
Basophils Relative: 0 % (ref 0–1)
CD4 T Helper %: 15 %
Creatinine, Ser: 1.2 mg/dL (ref 0.40–1.20)
Eosinophils Absolute: 0.3 10*3/uL (ref 0.0–0.7)
HIV 1 RNA Quant: <48 copies/mL (ref <48)
HIV-1 RNA Quant, Log: <1.68 (ref <1.68)
Lymphs Abs: 2 10*3/uL (ref 0.7–4.0)
MCV: 84.7 fL (ref 78.0–100.0)
Monocytes Relative: 5 % (ref 3–12)
Neutro Abs: 6.1 10*3/uL (ref 1.7–7.7)
Neutrophils Relative %: 68 % (ref 43–77)
Platelets: 437 10*3/uL — ABNORMAL HIGH (ref 150–400)
RBC: 4.25 M/uL (ref 3.87–5.11)
Sodium: 134 meq/L — ABNORMAL LOW (ref 135–145)
Total Bilirubin: 0.2 mg/dL — ABNORMAL LOW (ref 0.3–1.2)
Total CHOL/HDL Ratio: 7.3
Total Protein: 7.3 g/dL (ref 6.0–8.3)
WBC: 8.9 10*3/uL (ref 4.0–10.5)

## 2008-12-23 ENCOUNTER — Encounter: Payer: Self-pay | Admitting: Internal Medicine

## 2008-12-23 ENCOUNTER — Ambulatory Visit: Payer: Self-pay | Admitting: Internal Medicine

## 2008-12-23 DIAGNOSIS — E1169 Type 2 diabetes mellitus with other specified complication: Secondary | ICD-10-CM

## 2008-12-23 DIAGNOSIS — E785 Hyperlipidemia, unspecified: Secondary | ICD-10-CM

## 2008-12-23 HISTORY — DX: Hyperlipidemia, unspecified: E78.5

## 2009-01-07 ENCOUNTER — Telehealth: Payer: Self-pay | Admitting: Internal Medicine

## 2009-01-18 ENCOUNTER — Telehealth: Payer: Self-pay | Admitting: Internal Medicine

## 2009-03-21 ENCOUNTER — Encounter (INDEPENDENT_AMBULATORY_CARE_PROVIDER_SITE_OTHER): Payer: Self-pay | Admitting: *Deleted

## 2009-03-21 ENCOUNTER — Ambulatory Visit: Payer: Self-pay | Admitting: Internal Medicine

## 2009-03-21 LAB — CONVERTED CEMR LAB
CD4 Count: 474 microliters
CD4 T Helper %: 18 %

## 2009-03-22 ENCOUNTER — Encounter: Payer: Self-pay | Admitting: Internal Medicine

## 2009-03-22 LAB — CONVERTED CEMR LAB
Absolute CD4: 474 #/uL (ref 381–1469)
BUN: 23 mg/dL (ref 6–23)
Basophils Relative: 1 % (ref 0–1)
CO2: 21 meq/L (ref 19–32)
Cholesterol: 307 mg/dL — ABNORMAL HIGH (ref 0–200)
Eosinophils Absolute: 0.4 10*3/uL (ref 0.0–0.7)
Eosinophils Relative: 4 % (ref 0–5)
Glucose, Bld: 141 mg/dL — ABNORMAL HIGH (ref 70–99)
HCT: 36 % (ref 36.0–46.0)
HIV-1 RNA Quant, Log: <1.68 (ref <1.68)
Hemoglobin: 11.9 g/dL — ABNORMAL LOW (ref 12.0–15.0)
MCHC: 33.1 g/dL (ref 30.0–36.0)
MCV: 88.2 fL (ref 78.0–100.0)
Monocytes Absolute: 0.7 10*3/uL (ref 0.1–1.0)
Monocytes Relative: 7 % (ref 3–12)
Neutro Abs: 5.7 10*3/uL (ref 1.7–7.7)
RBC: 4.08 M/uL (ref 3.87–5.11)
Sodium: 131 meq/L — ABNORMAL LOW (ref 135–145)
Total Bilirubin: 0.2 mg/dL — ABNORMAL LOW (ref 0.3–1.2)
Total Protein: 7.7 g/dL (ref 6.0–8.3)
Total lymphocyte count: 2632 cells/mcL (ref 700–3300)
Triglycerides: 662 mg/dL — ABNORMAL HIGH (ref <150)

## 2009-03-24 ENCOUNTER — Ambulatory Visit: Payer: Self-pay | Admitting: Internal Medicine

## 2009-03-24 ENCOUNTER — Encounter: Payer: Self-pay | Admitting: Internal Medicine

## 2009-04-07 ENCOUNTER — Ambulatory Visit: Payer: Self-pay | Admitting: Internal Medicine

## 2009-05-23 ENCOUNTER — Ambulatory Visit: Payer: Self-pay | Admitting: Internal Medicine

## 2009-06-01 ENCOUNTER — Ambulatory Visit: Payer: Self-pay | Admitting: Internal Medicine

## 2009-06-07 ENCOUNTER — Telehealth: Payer: Self-pay | Admitting: Internal Medicine

## 2009-06-23 ENCOUNTER — Encounter: Payer: Self-pay | Admitting: Internal Medicine

## 2009-06-28 ENCOUNTER — Telehealth: Payer: Self-pay | Admitting: Internal Medicine

## 2009-07-07 ENCOUNTER — Ambulatory Visit: Payer: Self-pay | Admitting: Internal Medicine

## 2009-07-07 LAB — CONVERTED CEMR LAB
HIV 1 RNA Quant: <48 copies/mL (ref <48)
HIV-1 RNA Quant, Log: <1.68 (ref <1.68)

## 2009-07-12 ENCOUNTER — Encounter: Payer: Self-pay | Admitting: Internal Medicine

## 2009-07-12 LAB — CONVERTED CEMR LAB
ALT: 13 units/L (ref 0–35)
AST: 23 units/L (ref 0–37)
Albumin: 4 g/dL (ref 3.5–5.2)
Calcium: 8.6 mg/dL (ref 8.4–10.5)
Chloride: 100 meq/L (ref 96–112)
HCT: 34.4 % — ABNORMAL LOW (ref 36.0–46.0)
Lymphocytes Relative: 24 % (ref 12–46)
Lymphs Abs: 1.9 10*3/uL (ref 0.7–4.0)
Neutrophils Relative %: 66 % (ref 43–77)
Platelets: 423 10*3/uL — ABNORMAL HIGH (ref 150–400)
Potassium: 5 meq/L (ref 3.5–5.3)
Sodium: 127 meq/L — ABNORMAL LOW (ref 135–145)
Total CHOL/HDL Ratio: 6
WBC: 8 10*3/uL (ref 4.0–10.5)

## 2009-07-22 ENCOUNTER — Ambulatory Visit: Payer: Self-pay | Admitting: Internal Medicine

## 2009-07-22 ENCOUNTER — Encounter (INDEPENDENT_AMBULATORY_CARE_PROVIDER_SITE_OTHER): Payer: Self-pay | Admitting: *Deleted

## 2009-07-25 LAB — CONVERTED CEMR LAB
Chloride: 91 meq/L — ABNORMAL LOW (ref 96–112)
Creatinine, Ser: 1.03 mg/dL (ref 0.40–1.20)
Potassium: 4.6 meq/L (ref 3.5–5.3)

## 2009-07-27 ENCOUNTER — Encounter: Payer: Self-pay | Admitting: Internal Medicine

## 2009-08-02 ENCOUNTER — Telehealth: Payer: Self-pay | Admitting: Internal Medicine

## 2009-08-18 ENCOUNTER — Encounter (INDEPENDENT_AMBULATORY_CARE_PROVIDER_SITE_OTHER): Payer: Self-pay | Admitting: *Deleted

## 2009-09-09 ENCOUNTER — Encounter (INDEPENDENT_AMBULATORY_CARE_PROVIDER_SITE_OTHER): Payer: Self-pay | Admitting: *Deleted

## 2009-09-26 ENCOUNTER — Telehealth: Payer: Self-pay | Admitting: Internal Medicine

## 2009-09-29 ENCOUNTER — Telehealth: Payer: Self-pay | Admitting: Internal Medicine

## 2009-10-05 ENCOUNTER — Telehealth (INDEPENDENT_AMBULATORY_CARE_PROVIDER_SITE_OTHER): Payer: Self-pay | Admitting: *Deleted

## 2009-10-09 ENCOUNTER — Telehealth (INDEPENDENT_AMBULATORY_CARE_PROVIDER_SITE_OTHER): Payer: Self-pay | Admitting: *Deleted

## 2009-10-13 ENCOUNTER — Telehealth (INDEPENDENT_AMBULATORY_CARE_PROVIDER_SITE_OTHER): Payer: Self-pay | Admitting: *Deleted

## 2009-10-17 ENCOUNTER — Telehealth (INDEPENDENT_AMBULATORY_CARE_PROVIDER_SITE_OTHER): Payer: Self-pay | Admitting: *Deleted

## 2009-10-25 ENCOUNTER — Encounter: Payer: Self-pay | Admitting: Internal Medicine

## 2009-11-01 ENCOUNTER — Telehealth (INDEPENDENT_AMBULATORY_CARE_PROVIDER_SITE_OTHER): Payer: Self-pay | Admitting: *Deleted

## 2009-11-10 ENCOUNTER — Ambulatory Visit: Payer: Self-pay | Admitting: Internal Medicine

## 2009-11-10 LAB — CONVERTED CEMR LAB
HIV 1 RNA Quant: <20 copies/mL (ref <20)
HIV-1 RNA Quant, Log: <1.3 (ref <1.30)

## 2009-11-16 ENCOUNTER — Encounter: Payer: Self-pay | Admitting: Internal Medicine

## 2009-11-16 LAB — CONVERTED CEMR LAB
ALT: 17 units/L (ref 0–35)
AST: 17 units/L (ref 0–37)
Albumin: 4.3 g/dL (ref 3.5–5.2)
Alkaline Phosphatase: 77 units/L (ref 39–117)
Basophils Absolute: 0 10*3/uL (ref 0.0–0.1)
Basophils Relative: 0 % (ref 0–1)
Calcium: 9.5 mg/dL (ref 8.4–10.5)
Chloride: 95 meq/L — ABNORMAL LOW (ref 96–112)
Cholesterol: 301 mg/dL — ABNORMAL HIGH (ref 0–200)
HDL: 49 mg/dL (ref >39)
Hemoglobin: 11.8 g/dL — ABNORMAL LOW (ref 12.0–15.0)
Lymphocytes Relative: 27 % (ref 12–46)
MCHC: 32.2 g/dL (ref 30.0–36.0)
Monocytes Absolute: 0.8 10*3/uL (ref 0.1–1.0)
Neutro Abs: 6.8 10*3/uL (ref 1.7–7.7)
Neutrophils Relative %: 63 % (ref 43–77)
Platelets: 412 10*3/uL — ABNORMAL HIGH (ref 150–400)
Potassium: 4.5 meq/L (ref 3.5–5.3)
RDW: 18.1 % — ABNORMAL HIGH (ref 11.5–15.5)
Sodium: 134 meq/L — ABNORMAL LOW (ref 135–145)
Total Protein: 7.5 g/dL (ref 6.0–8.3)

## 2009-11-21 ENCOUNTER — Ambulatory Visit: Payer: Self-pay | Admitting: Internal Medicine

## 2009-11-21 LAB — CONVERTED CEMR LAB: Blood Glucose, Home Monitor: 4 mg/dL

## 2009-11-23 ENCOUNTER — Encounter (INDEPENDENT_AMBULATORY_CARE_PROVIDER_SITE_OTHER): Payer: Self-pay | Admitting: *Deleted

## 2009-11-28 ENCOUNTER — Ambulatory Visit: Payer: Self-pay | Admitting: Internal Medicine

## 2009-12-20 ENCOUNTER — Ambulatory Visit: Payer: Self-pay

## 2010-01-04 ENCOUNTER — Telehealth: Payer: Self-pay | Admitting: Internal Medicine

## 2010-01-12 ENCOUNTER — Telehealth (INDEPENDENT_AMBULATORY_CARE_PROVIDER_SITE_OTHER): Payer: Self-pay | Admitting: *Deleted

## 2010-01-31 NOTE — Miscellaneous (Signed)
Summary: Orders Update  Clinical Lists Changes  Orders: Added new Test order of T-CBC w/Diff [phone]) - Signed Added new Test order of T-CD4SP [facility]) (CD4SP) - Signed Added new Test order of T-HIV Viral Load [phone]) - Signed Added new Test order of T-Comprehensive Metabolic Panel ([id number]) - Signed     Process Orders Check Orders Results:     [facility]: [id number] not required for this insurance Tests Sent for requisitioning (June 23, 2009 12:04 PM):     06/23/2009: [facility] -- T-CBC w/Diff [id number] (signed)     06/23/2009: [facility] -- T-HIV Viral Load [id number] (signed)     06/23/2009: [facility] -- T-Comprehensive Metabolic Panel [id number] (signed)

## 2010-01-31 NOTE — Progress Notes (Signed)
Summary: Patient Assistance - [place]  Phone Note Outgoing Call   Summary of Call: Patient Assistance Medication - [place] was delivered.  Contacted patient and left message advising her medication was here and she could pick up on Monday.   Initial call taken by: Meyer Cory,  October 09, 2009 9:27 AM

## 2010-01-31 NOTE — Progress Notes (Signed)
Summary: Request for med not on file  Phone Note Refill Request Message from:  Fax from Pharmacy on June 07, 2009 12:57 PM  Refills Requested: Medication #1:  CELEBREX 100 MG CAPS Take 1 tablet by mouth two times a day   Last Refilled: 05/05/2009 Also Xyzal 5mg  tablet once daily ( it is not on active or inactive medication list) Last filled 05/05/09   Method Requested: Telephone to Pharmacy Next Appointment Scheduled: July 11, 2009 Initial call taken by: Canary Brim  BS,CPht II,MPH,  June 07, 2009 12:58 PM Caller: [place] Reason for Call: Needs renewal  Follow-up for Phone Call        Ii never prescribed this Follow-up by: Aldona Bar MD,  June 07, 2009 2:36 PM  Additional Follow-up for Phone Call Additional follow up Details #1::        Will let pharmacy know. Additional Follow-up by: Canary Brim  BS,CPht II,MPH,  June 07, 2009 4:23 PM     Appended Document: Request for med not on file [facility] sent  back the original script written 04/19/08 for the Celebrex.   Also, the Xyzal patient is requesting was written on 01/19/08.  They said it was changed from [place].  They would like to know if you want to continue to prescribe these medications for this patient. Canary Brim  BS,CPht II,MPH  June 08, 2009 9:39 AM   If she wants them - ok Clinical Lists Changes  Medications: Removed medication of * ALLEGRA 180 MG Take one table by mouth every day. Added new medication of XYZAL 5 MG TABS (LEVOCETIRIZINE DIHYDROCHLORIDE) Take 1 tablet by mouth once a day - Signed Rx of XYZAL 5 MG TABS (LEVOCETIRIZINE DIHYDROCHLORIDE) Take 1 tablet by mouth once a day;  #30 x 5;  Signed;  Entered by: Canary Brim  BS,CPht II,MPH;  Authorized by: Aldona Bar MD;  Method used: Telephoned to [facility], [address], [place], [place], [place]  [address], Ph: [phone], Fax: [phone] Rx of CELEBREX 100 MG CAPS (CELECOXIB) Take 1 tablet by mouth two times a day;   #60 x 5;  Signed;  Entered by: Canary Brim  BS,CPht II,MPH;  Authorized by: Aldona Bar MD;  Method used: Telephoned to [facility], [address] [place], [place], [place]  [address], Ph: [phone], Fax: [phone]    Prescriptions: CELEBREX 100 MG CAPS (CELECOXIB) Take 1 tablet by mouth two times a day  #60 x 5   Entered by:   Canary Brim  BS,CPht II,MPH   Authorized by:   Aldona Bar MD   Signed by:   Canary Brim  BS,CPht II,MPH on 06/08/2009   Method used:   Telephoned to ...       [facility] (retail)       [place], [place]  [address]       Ph: [phone]       Fax: [phone]   RxID:   [id number] XYZAL 5 MG TABS (LEVOCETIRIZINE DIHYDROCHLORIDE) Take 1 tablet by mouth once a day  #30 x 5   Entered by:   Canary Brim  BS,CPht II,MPH   Authorized by:   Aldona Bar MD   Signed by:   Canary Brim  BS,CPht II,MPH on 06/08/2009   Method used:   Telephoned to ...       [facility] (retail)  [place], [place]  [address]       Ph: [phone]       Fax: [phone]   RxID:   [id number]  Canary Brim  BS,CPht II,MPH  June 08, 2009 9:51 AM

## 2010-01-31 NOTE — Assessment & Plan Note (Signed)
Summary: RW Flowsheet updated

## 2010-01-31 NOTE — Letter (Signed)
Summary: Novo Nordis Pt. Assistance: Cytogeneticist Nordis Pt. Assistance: Financial   Imported By: Bonner Puna 10/25/2009 14:36:15  _____________________________________________________________________  External Attachment:    Type:   Image     Comment:   External Document

## 2010-01-31 NOTE — Progress Notes (Signed)
Summary: Pt Assistance - Lpid, Protonix & Tegretol  Phone Note Outgoing Call   Summary of Call: Pt Assistance Medication(s) Protonix, Lpid and Tegretol have arrived.  Left message or spoke to patient advising them they were ready for pickup.   Initial call taken by: Meyer Cory,  October 13, 2009 2:10 PM
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
## 2010-01-31 NOTE — Progress Notes (Signed)
Summary: Medication refill  Phone Note Outgoing Call   Summary of Call:    Pt requested FYI:  Out of refills on her meds.   She did call [facility] .   All of her meds !  She will be out Oct 3rd.   Pt just wanted you to know so you could make sure it happens.  She has had problems in the past.  Orland Mustard RN  September 26, 2009 12:06 PM  Initial call taken by: Orland Mustard RN,  September 26, 2009 12:07 PM Call placed by: Canary Brim  BS,CPht II,MPH,  September 28, 2009 12:19 PM Call placed to: Patient Summary of Call: tried to contact patient, but phone has been busy.  I need her to come in to sign the forms. Initial call taken by: Canary Brim  BS,CPht II,MPH,  September 28, 2009 12:20 PM  Follow-up for Phone Call        She will need to meet with Verdis Frederickson ASAP to get her meds. If she is out of refills then she will no longer be ableto get her meds at [facility]  Patient called back and asked if those medication not available on the [facility] $4 plan can be called into [facility].  She will be coming by to bring 2010 tax returns on Friday  before 5pm 09/30/09 and signing all forms. Follow-up by: Aldona Bar MD,  September 26, 2009 2:20 PM  Additional Follow-up for Phone Call Additional follow up Details #1::        received refill request from [facility].  I spoke to Tammy to get clarification on what to do with the refills. Based on Dr. Tomma Lightning, patient will need to enroll in a patient assistance program to obtain her medications and those that are on the $4 plan; obtain through [facility].  Patient notified to come in and sign forms.  She will need to obtain the HCTZ, Lisinopril, Metformin, Glucotrol, and Tramadol at [facility].  Tramadol will not be $4. Additional Follow-up by: Canary Brim  BS,CPht II,MPH,  September 28, 2009 10:43 AM    Additional Follow-up for Phone Call Additional follow up Details #2::    Pt. left me a voicemail and I called back and left a voicemail  that she needs to make an appt. with Verdis Frederickson ASAP in order to receive her medicines Follow-up by: [name] CMA Deborra Medina),  September 29, 2009 12:04 PM  Prescriptions: GLUCOTROL 10 MG TABS (GLIPIZIDE) Take 1 tablet by mouth two times a day  #60 x 5   Entered by:   Canary Brim  BS,CPht II,MPH   Authorized by:   Aldona Bar MD   Signed by:   Canary Brim  BS,CPht II,MPH on 09/29/2009   Method used:   Electronically to        [facility] [phone]* (retail)       [place], [place]  [address]       Ph: [phone]       Fax: [phone]   RxID:   [id number] TRAMADOL HCL 50 MG TABS (TRAMADOL HCL) Take 1 tablet by mouth every 8 hours as needed  #90 x 5   Entered by:   Canary Brim  BS,CPht II,MPH   Authorized by:   Aldona Bar MD   Signed by:   Canary Brim  BS,CPht II,MPH on 09/29/2009   Method used:   Electronically to  [facility] [phone]* (retail)       [place], [place]  [address]       Ph: [phone]       Fax: [phone]   RxID:   [id number] METFORMIN HCL 1000 MG TABS (METFORMIN HCL) Take 1 tablet by mouth two times a day  #60 x 5   Entered by:   Canary Brim  BS,CPht II,MPH   Authorized by:   Aldona Bar MD   Signed by:   Canary Brim  BS,CPht II,MPH on 09/29/2009   Method used:   Electronically to        [facility] [phone]* (retail)       [address]       [place], [place]  [address]       Ph: [phone]       Fax: [phone]   RxID:   [id number] HYDROCHLOROTHIAZIDE 25 MG TABS (HYDROCHLOROTHIAZIDE) Take 1 tablet by mouth once a day  #30 x 5   Entered by:   Canary Brim  BS,CPht II,MPH   Authorized by:   Aldona Bar MD   Signed by:   Canary Brim  BS,CPht II,MPH on 09/29/2009   Method used:   Electronically to        [facility] [phone]* (retail)       [address]       [place], [place]  [address]       Ph: [phone]       Fax: [phone]   RxID:    [id number] LISINOPRIL 10 MG TABS (LISINOPRIL) Take 1 tablet by mouth once a day  #30 x 5   Entered by:   Canary Brim  BS,CPht II,MPH   Authorized by:   Aldona Bar MD   Signed by:   Canary Brim  BS,CPht II,MPH on 09/29/2009   Method used:   Electronically to        [facility] [phone]* (retail)       [address]       [place], [place]  [address]       Ph: [phone]       Fax: [phone]   [id number] SYMBICORT 80-4.5 MCG/ACT AERO (BUDESONIDE-FORMOTEROL FUMARATE) two puffs two times a day  #3 months x 4   Entered by:   Canary Brim  BS,CPht II,MPH   Authorized by:   Aldona Bar MD   Signed by:   Canary Brim  BS,CPht II,MPH on 09/28/2009   Method used:   Printed then mailed to ...         RxIDSQ:[id number] CARBAMAZEPINE 200 MG TABS (CARBAMAZEPINE) Take 2  tablets by mouth two times a day  #360 x 4   Entered by:   Canary Brim  BS,CPht II,MPH   Authorized by:   Aldona Bar MD   Signed by:   Canary Brim  BS,CPht II,MPH on 09/28/2009   Method used:   Printed then mailed to ...         [id number]  Printed prescriptions to send to patient assistance programs. Canary Brim  BS,CPht II,MPH  September 28, 2009 12:42 PM

## 2010-01-31 NOTE — Miscellaneous (Signed)
Summary: problem list update  Clinical Lists Changes  Problems: Added new problem of SCREENING FOR MALIGNANT NEOPLASM OF THE CERVIX (ICD-V76.2)

## 2010-01-31 NOTE — Progress Notes (Signed)
Summary: Diflucan samples arrived  Phone Note Outgoing Call   Call placed by: Kennyth Lose Summary of Call: Called pt. and left message that Diflucan is ready for pick up.  Med. comes from [employer], [phone] for refills Initial call taken by: [name] CMA [facility]),  November 01, 2009 10:50 AM     Appended Document: Diflucan samples arrived pt. picked up samples

## 2010-01-31 NOTE — Assessment & Plan Note (Signed)
Summary: per pt. runnin fever [mkj]   CC:  pt. c/o fever, cough, and chest congestion x 2 days.  History of Present Illness: Pt c/o several days of chest congestion, cough and fever.    Preventive Screening-Counseling & Management  Alcohol-Tobacco     Alcohol drinks/day: 0     Smoking Status: current     Smoking Cessation Counseling: yes     Packs/Day: 1.0  Caffeine-Diet-Exercise     Caffeine use/day: 2     Does Patient Exercise: no     Type of exercise: walks     Exercise (avg: min/session): 30-60     Times/week: 4  Safety-Violence-Falls     Seat Belt Use: yes      Sexual History:  currently monogamous.        Drug Use:  former.     Updated Prior Medication List: ATRIPLA 600-200-300 MG TABS (EFAVIRENZ-EMTRICITAB-TENOFOVIR) Take 1 tablet by mouth at bedtime LISINOPRIL 10 MG TABS (LISINOPRIL) Take 1 tablet by mouth once a day HYDROCHLOROTHIAZIDE 25 MG TABS (HYDROCHLOROTHIAZIDE) Take 1 tablet by mouth once a day METFORMIN HCL 1000 MG TABS (METFORMIN HCL) Take 1 tablet by mouth two times a day SYMBICORT 80-4.5 MCG/ACT AERO (BUDESONIDE-FORMOTEROL FUMARATE) two puffs two times a day CARBAMAZEPINE 200 MG TABS (CARBAMAZEPINE) Take 2  tablets by mouth two times a day LANTUS SOLOSTAR 100 UNIT/ML SOLN (INSULIN GLARGINE) 50 units every night NOVOLOG FLEXPEN 100 UNIT/ML SOLN (INSULIN ASPART) per sliding scale CELEBREX 100 MG CAPS (CELECOXIB) Take 1 tablet by mouth two times a day TRAMADOL HCL 50 MG TABS (TRAMADOL HCL) Take 1 tablet by mouth every 8 hours as needed GLUCOTROL 10 MG TABS (GLIPIZIDE) Take 1 tablet by mouth two times a day FLUCONAZOLE 100 MG TABS (FLUCONAZOLE) Take 1 tablet by mouth once a day PROTONIX 40 MG SOLR (PANTOPRAZOLE SODIUM) Take 1 tablet by mouth two times a day * ALLEGRA 180 MG Take one table by mouth every day. VICODIN 5-500 MG TABS (HYDROCODONE-ACETAMINOPHEN) Take 1 tablet by mouth every 8 hours as needed LOPID 600 MG TABS (GEMFIBROZIL) Take 1 tablet by  mouth two times a day 1/2 hour before meals ALBUTEROL SULFATE (2.5 MG/3ML) 0.083% NEBU (ALBUTEROL SULFATE) 35ml in nebulizer every 6 hours as needed ZITHROMAX Z-PAK 250 MG TABS (AZITHROMYCIN) take as directed  Current Allergies (reviewed today): No known allergies  Past History:  Past Medical History: Last updated: 04/19/2008 Depression Diabetes mellitus, type II HIV disease Hypertension Peripheral neuropathy  Review of Systems  The patient denies anorexia, weight loss, and hemoptysis.    Vital Signs:  Patient profile:   45 year old female Menstrual status:  irregular Height:      70 inches (177.80 cm) Weight:      230.12 pounds (104.60 kg) BMI:     33.14 O2 Sat:      96 % on Room air Temp:     98.3 degrees F (36.83 degrees C) oral Pulse rate:   102 / minute BP sitting:   125 / 80  (right arm)  Vitals Entered By: [name] CMA Deborra Medina) (May 23, 2009 3:20 PM)  O2 Flow:  Room air CC: pt. c/o fever, cough, chest congestion x 2 days Is Patient Diabetic? Yes Did you bring your meter with you today? No Pain Assessment Patient in pain? yes     Location: ribs Intensity: 8 Type: sharp Onset of pain  Intermittent Nutritional Status BMI of > 30 = obese Nutritional Status Detail appetite "not too good"  Does patient need assistance? Functional Status Self care Ambulation Normal Comments no missed doses of meds per patient   Physical Exam  General:  alert, well-developed, well-nourished, and well-hydrated.   Head:  normocephalic and atraumatic.   Mouth:  pharynx pink and moist.   Lungs:  scattered expiratory wheezes   Impression & Recommendations:  Problem # 1:  ACUTE BRONCHITIS (ICD-466.0) albuterol by med nebulizer z-pack Orders: Est. Patient Level III [id number])  Medications Added to Medication List This Visit: 1)  Albuterol Sulfate (2.5 Mg/94ml) 0.083% Nebu (Albuterol sulfate) .... 67ml in nebulizer every 6 hours as needed 2)  Zithromax Z-pak 250  Mg Tabs (Azithromycin) .... Take as directed Prescriptions: ZITHROMAX Z-PAK 250 MG TABS (AZITHROMYCIN) take as directed  #1 pack x 0   Entered and Authorized by:   Aldona Bar MD   Signed by:   Aldona Bar MD on 05/23/2009   Method used:   Print then Give to Patient   RxID:   [id number] ALBUTEROL SULFATE (2.5 MG/3ML) 0.083% NEBU (ALBUTEROL SULFATE) 49ml in nebulizer every 6 hours as needed  #40 vials x 0   Entered and Authorized by:   Aldona Bar MD   Signed by:   Aldona Bar MD on 05/23/2009   Method used:   Print then Give to Patient   RxID:   [id number]   Appended Document: Med/Alg Import      Medication Administration  Medication # 1:    Medication: Albuterol Sulfate Sol 1mg  unit dose    Diagnosis: ACUTE BRONCHITIS (ICD-466.0)    Dose: 2.5 mg/3 ml    Route: inhaled    Exp Date: 12/12    Lot #: [id number]    Mfr: nephron    Patient tolerated medication without complications    Given by: [name] CMA Deborra Medina) (May 23, 2009 4:23 PM)  Orders Added: 1)  Albuterol Sulfate Sol 1mg  unit dose [J7613] 2)  Nebulizer Tx [id number]

## 2010-01-31 NOTE — Assessment & Plan Note (Signed)
Summary: high fever x3days [mkj]   CC:  pt. complaining of fever, chest congestion, and cough x 4 days.  History of Present Illness: Pt c/o 4 days of cough productive of brown sputum, wheezing and one day of fever to 101.  She has been using mucinex and a nebulizer for the cough. She has been staying in bed.  Preventive Screening-Counseling & Management  Alcohol-Tobacco     Alcohol drinks/day: 0     Smoking Status: current     Smoking Cessation Counseling: yes     Packs/Day: 1.0  Caffeine-Diet-Exercise     Caffeine use/day: 2     Does Patient Exercise: no  Safety-Violence-Falls     Seat Belt Use: yes      Sexual History:  currently monogamous.        Drug Use:  former.     Updated Prior Medication List: ATRIPLA 600-200-300 MG TABS (EFAVIRENZ-EMTRICITAB-TENOFOVIR) Take 1 tablet by mouth at bedtime LISINOPRIL 10 MG TABS (LISINOPRIL) Take 1 tablet by mouth once a day HYDROCHLOROTHIAZIDE 25 MG TABS (HYDROCHLOROTHIAZIDE) Take 1 tablet by mouth once a day METFORMIN HCL 1000 MG TABS (METFORMIN HCL) Take 1 tablet by mouth two times a day SYMBICORT 80-4.5 MCG/ACT AERO (BUDESONIDE-FORMOTEROL FUMARATE) two puffs two times a day CARBAMAZEPINE 200 MG TABS (CARBAMAZEPINE) Take 2  tablets by mouth two times a day LANTUS SOLOSTAR 100 UNIT/ML SOLN (INSULIN GLARGINE) 50 units every night NOVOLOG FLEXPEN 100 UNIT/ML SOLN (INSULIN ASPART) per sliding scale CELEBREX 100 MG CAPS (CELECOXIB) Take 1 tablet by mouth two times a day TRAMADOL HCL 50 MG TABS (TRAMADOL HCL) Take 1 tablet by mouth every 8 hours as needed GLUCOTROL 10 MG TABS (GLIPIZIDE) Take 1 tablet by mouth two times a day FLUCONAZOLE 100 MG TABS (FLUCONAZOLE) Take 1 tablet by mouth once a day PROTONIX 40 MG SOLR (PANTOPRAZOLE SODIUM) Take 1 tablet by mouth two times a day * ALLEGRA 180 MG Take one table by mouth every day. VICODIN 5-500 MG TABS (HYDROCODONE-ACETAMINOPHEN) Take 1 tablet by mouth every 8 hours as needed LOPID 600 MG  TABS (GEMFIBROZIL) Take 1 tablet by mouth two times a day 1/2 hour before meals ZITHROMAX Z-PAK 250 MG TABS (AZITHROMYCIN) take as directed CIPROFLOXACIN HCL 500 MG TABS (CIPROFLOXACIN HCL) Take 1 tablet by mouth two times a day PROAIR HFA 108 (90 BASE) MCG/ACT AERS (ALBUTEROL SULFATE) one puff every 6 hours as needed  Current Allergies (reviewed today): No known allergies  Past History:  Past Medical History: Last updated: 04/19/2008 Depression Diabetes mellitus, type II HIV disease Hypertension Peripheral neuropathy  Social History: Sexual History:  currently monogamous Drug Use:  former  Review of Systems       The patient complains of anorexia and fever.  The patient denies dyspnea on exertion and hemoptysis.    Vital Signs:  Patient profile:   45 year old female Menstrual status:  irregular Height:      70 inches (177.80 cm) Weight:      233.0 pounds (105.91 kg) BMI:     33.55 Temp:     98.5 degrees F (36.94 degrees C) oral Pulse rate:   99 / minute BP sitting:   127 / 76  (left arm)  Vitals Entered By: [name] CMA Deborra Medina) (April 07, 2009 2:09 PM) CC: pt. complaining of fever, chest congestion, cough x 4 days Is Patient Diabetic? Yes Did you bring your meter with you today? No Pain Assessment Patient in pain? yes  Location: left side of back Intensity: 8 Type: sharp Onset of pain  Intermittent Nutritional Status BMI of > 30 = obese Nutritional Status Detail appetite "not too good"  Does patient need assistance? Functional Status Self care Ambulation Normal Comments no missed doses of meds per patient   Physical Exam  General:  alert, well-developed, well-nourished, and well-hydrated.   Head:  normocephalic and atraumatic.   Mouth:  pharynx pink and moist.   Lungs:  diffuse wheezes bilaterally   Impression & Recommendations:  Problem # 1:  ACUTE BRONCHITIS (ICD-466.0) Albuterol nebulizer treatment x1 treat with z-pack and cipro  and an albuterol inhaler The following medications were removed from the medication list:    Zithromax Z-pak 250 Mg Tabs (Azithromycin) .[name] Kitchen... Take as directed Her updated medication list for this problem includes:    Symbicort 80-4.5 Mcg/act Aero (Budesonide-formoterol fumarate) .[name] Kitchen..[name] Kitchen Two puffs two times a day    Zithromax Z-pak 250 Mg Tabs (Azithromycin) .[name] Kitchen... Take as directed    Ciprofloxacin Hcl 500 Mg Tabs (Ciprofloxacin hcl) .[name] Kitchen... Take 1 tablet by mouth two times a day    Proair Hfa 108 (90 Base) Mcg/act Aers (Albuterol sulfate) ..... One puff every 6 hours as needed  Orders: Est. Patient Level III [id number])  Medications Added to Medication List This Visit: 1)  Zithromax Z-pak 250 Mg Tabs (Azithromycin) .... Take as directed 2)  Ciprofloxacin Hcl 500 Mg Tabs (Ciprofloxacin hcl) .... Take 1 tablet by mouth two times a day 3)  Proair Hfa 108 (90 Base) Mcg/act Aers (Albuterol sulfate) .... One puff every 6 hours as needed Prescriptions: VICODIN 5-500 MG TABS (HYDROCODONE-ACETAMINOPHEN) Take 1 tablet by mouth every 8 hours as needed  #30 x 0   Entered and Authorized by:   Aldona Bar MD   Signed by:   Aldona Bar MD on 04/07/2009   Method used:   Print then Give to Patient   RxID:   [id number] PROAIR HFA 108 (90 BASE) MCG/ACT AERS (ALBUTEROL SULFATE) one puff every 6 hours as needed  #0 x 0   Entered and Authorized by:   Aldona Bar MD   Signed by:   Aldona Bar MD on 04/07/2009   Method used:   Print then Give to Patient   RxID:   [id number] CIPROFLOXACIN HCL 500 MG TABS (CIPROFLOXACIN HCL) Take 1 tablet by mouth two times a day  #20 x 0   Entered and Authorized by:   Aldona Bar MD   Signed by:   Aldona Bar MD on 04/07/2009   Method used:   Print then Give to Patient   RxID:   [id number] ZITHROMAX Z-PAK 250 MG TABS (AZITHROMYCIN) take as directed  #1 pack x 0   Entered and Authorized by:   Aldona Bar MD   Signed by:   Aldona Bar MD on  04/07/2009   Method used:   Print then Give to Patient   RxID:   [id number]

## 2010-01-31 NOTE — Miscellaneous (Signed)
  Clinical Lists Changes  Observations: Added new observation of MNT ASSESS: Initial Medical Nutrition Therapy(3 hours/1st year) (11/16/2009 8:48) Added new observation of DIABETIC ED: Initial Diabetes Self management Training  (11/16/2009 8:48) Added new observation of PMH DYSLIPID: Dyslipidemia (11/16/2009 8:48) Added new observation of S/SHYPERGLYC: Recurrent Hyperglycemia (11/16/2009 8:48) Added new observation of DIABDIETCOMM: uncontrolled blood glucose (11/16/2009 8:48)      Diabetes Self Management Training Referral Patient Name: [name] Date Of Birth: April 13, 1965 MRN: [id number] Current Diagnosis:  SCREENING FOR MALIGNANT NEOPLASM OF THE CERVIX (ICD-V76.2) HYPERLIPIDEMIA (ICD-272.4) ACUTE BRONCHITIS (ICD-466.0) DIARRHEA (ICD-787.91) FATIGUE (ICD-780.79) THRUSH (ICD-112.0) PERIPHERAL NEUROPATHY (ICD-356.9) HYPERTENSION (ICD-401.9) HIV DISEASE (ICD-042) DIABETES MELLITUS, TYPE II (ICD-250.00) DEPRESSION (ICD-311)     Management Training Needs:   Initial Diabetes Self management Training   Initial Medical Nutrition Therapy(3 hours/1st year) Please Specify change in Medical condition, treatment or diagnosis uncontrolled blood glucose  Complicating Conditions:  Dyslipidemia  Recurrent Hyperglycemia

## 2010-01-31 NOTE — Miscellaneous (Signed)
Summary: RW Flowsheet updated  Clinical Lists Changes  Observations: Added new observation of T-HELPER %: 18 % (03/21/2009 11:44) Added new observation of CD4 COUNT: 474 microliters (03/21/2009 11:44) Added new observation of T-HELPER %: 15 % (12/20/2008 11:44) Added new observation of CD4 COUNT: 294 microliters (12/20/2008 11:44) Added new observation of T-HELPER %: 15 % (08/23/2008 11:44) Added new observation of CD4 COUNT: 310 microliters (08/23/2008 11:44) Added new observation of T-HELPER %: 14 % (07/08/2008 11:43) Added new observation of CD4 COUNT: 287 microliters (07/08/2008 11:43)

## 2010-01-31 NOTE — Progress Notes (Signed)
Summary: refill/mld  Phone Note Call from Patient   Caller: Patient Reason for Call: Refill Medication Summary of Call: Patient called to let us know that she tried to call in her refill for Atripla.  She was told by the pharmacy that they need to call doctor for additional refills.  She is asking [place] to call refills in for her. Initial call taken by: Canary Brim  BS,CPht II,MPH,  August 02, 2009 4:09 PM  Follow-up for Phone Call        Request done. Follow-up by: Canary Brim  BS,CPht II,MPH,  August 02, 2009 4:09 PM    Prescriptions: ATRIPLA 600-200-300 MG TABS (EFAVIRENZ-EMTRICITAB-TENOFOVIR) Take 1 tablet by mouth at bedtime  #30 x 6   Entered by:   Canary Brim  BS,CPht II,MPH   Authorized by:   Aldona Bar MD   Signed by:   Canary Brim  BS,CPht II,MPH on 08/02/2009   Method used:   Electronically to        [facility]* (retail)       [address]       [place], [place]  [address]       Ph: [phone]       Fax:    RxID:   [id number]  Canary Brim  BS,CPht II,MPH  August 02, 2009 4:09 PM

## 2010-01-31 NOTE — Miscellaneous (Signed)
Summary: RW Update  Clinical Lists Changes  Observations: Added new observation of DATE1STVISIT: 06/01/2009 (07/27/2009 12:03)

## 2010-01-31 NOTE — Miscellaneous (Signed)
  Clinical Lists Changes 

## 2010-01-31 NOTE — Assessment & Plan Note (Signed)
Summary: 25MONTH F/U/VS   CC:  follow-up visit, lab results, and c/o chest congestion and cough.  History of Present Illness: Pt here for lab results.  She c/o cough for about the last 4 days or so.  No fever or SOB.  She is using her inhalers.  Preventive Screening-Counseling & Management  Alcohol-Tobacco     Alcohol drinks/day: 0     Smoking Status: current     Smoking Cessation Counseling: yes     Packs/Day: 1.0  Caffeine-Diet-Exercise     Caffeine use/day: 2     Does Patient Exercise: no     Type of exercise: walks     Exercise (avg: min/session): 30-60     Times/week: 4  Safety-Violence-Falls     Seat Belt Use: yes      Sexual History:  currently monogamous.        Drug Use:  former.    Comments: pt. declined condoms   Updated Prior Medication List: ATRIPLA 600-200-300 MG TABS (EFAVIRENZ-EMTRICITAB-TENOFOVIR) Take 1 tablet by mouth at bedtime LISINOPRIL 10 MG TABS (LISINOPRIL) Take 1 tablet by mouth once a day HYDROCHLOROTHIAZIDE 25 MG TABS (HYDROCHLOROTHIAZIDE) Take 1 tablet by mouth once a day METFORMIN HCL 1000 MG TABS (METFORMIN HCL) Take 1 tablet by mouth two times a day SYMBICORT 80-4.5 MCG/ACT AERO (BUDESONIDE-FORMOTEROL FUMARATE) two puffs two times a day CARBAMAZEPINE 200 MG TABS (CARBAMAZEPINE) Take 2  tablets by mouth two times a day LANTUS SOLOSTAR 100 UNIT/ML SOLN (INSULIN GLARGINE) 50 units every night NOVOLOG FLEXPEN 100 UNIT/ML SOLN (INSULIN ASPART) per sliding scale CELEBREX 100 MG CAPS (CELECOXIB) Take 1 tablet by mouth two times a day TRAMADOL HCL 50 MG TABS (TRAMADOL HCL) Take 1 tablet by mouth every 8 hours as needed GLUCOTROL 10 MG TABS (GLIPIZIDE) Take 1 tablet by mouth two times a day FLUCONAZOLE 100 MG TABS (FLUCONAZOLE) Take 1 tablet by mouth once a day PROTONIX 40 MG SOLR (PANTOPRAZOLE SODIUM) Take 1 tablet by mouth two times a day VICODIN 5-500 MG TABS (HYDROCODONE-ACETAMINOPHEN) Take 1 tablet by mouth every 8 hours as needed LOPID 600  MG TABS (GEMFIBROZIL) Take 1 tablet by mouth two times a day 1/2 hour before meals ALBUTEROL SULFATE (2.5 MG/3ML) 0.083% NEBU (ALBUTEROL SULFATE) 28ml in nebulizer every 6 hours as needed ZITHROMAX Z-PAK 250 MG TABS (AZITHROMYCIN) take as directed XYZAL 5 MG TABS (LEVOCETIRIZINE DIHYDROCHLORIDE) Take 1 tablet by mouth once a day  Current Allergies (reviewed today): No known allergies  Past History:  Past Medical History: Last updated: 04/19/2008 Depression Diabetes mellitus, type II HIV disease Hypertension Peripheral neuropathy  Review of Systems  The patient denies anorexia, fever, weight loss, dyspnea on exertion, and hemoptysis.    Vital Signs:  Patient profile:   45 year old female Menstrual status:  irregular Height:      70 inches (177.80 cm) Weight:      229.12 pounds (104.15 kg) BMI:     32.99 O2 Sat:      96 % on Room air Temp:     98.1 degrees F (36.72 degrees C) oral Pulse rate:   92 / minute BP sitting:   124 / 76  (right arm)  Vitals Entered By: [name] CMA Deborra Medina) (July 22, 2009 3:16 PM)  O2 Flow:  Room air CC: follow-up visit, lab results, c/o chest congestion and cough Is Patient Diabetic? Yes Did you bring your meter with you today? No Pain Assessment Patient in pain? yes     Location:  left side Intensity: 8 Type: aching Onset of pain  Chronic Nutritional Status BMI of > 30 = obese Nutritional Status Detail appetite "good"  Does patient need assistance? Functional Status Self care Ambulation Normal Comments no missed doses of meds per patient   Physical Exam  General:  alert, well-developed, well-nourished, and well-hydrated.   Head:  normocephalic and atraumatic.   Mouth:  pharynx pink and moist.   Lungs:  expiratory wheezes bilaterally    Impression & Recommendations:  Problem # 1:  HIV DISEASE (ICD-042) Pt.s most recent CD4ct was 290 and VL <48 .  Pt instructed to continue the current antiretroviral regimen.  Pt  encouraged to take medication regularly and not miss doses.  Pt will f/u in 3 months for repeat blood work and will see me 2 weeks later.  Diagnostics Reviewed:  HIV: CDC-defined AIDS (04/19/2008)   CD4: 290 (07/08/2009)   WBC: 8.0 (07/07/2009)   Hgb: 11.3 (07/07/2009)   HCT: 34.4 (07/07/2009)   Platelets: 423 (07/07/2009) HIV-1 RNA: <48 copies/mL (07/07/2009)   HBSAg: NO (02/25/2006)  Problem # 2:  HYPERLIPIDEMIA (ICD-272.4) Assessment: Improved  Her updated medication list for this problem includes:    Lopid 600 Mg Tabs (Gemfibrozil) .[name] Kitchen... Take 1 tablet by mouth two times a day 1/2 hour before meals  Labs Reviewed: SGOT: 23 (07/07/2009)   SGPT: 13 (07/07/2009)   HDL:45 (07/07/2009), 39 (03/22/2009)  LDL:149 (07/07/2009), See Comment mg/dL (03/22/2009)  Chol:268 (07/07/2009), 307 (03/22/2009)  Trig:371 (07/07/2009), 662 (03/22/2009)  Problem # 3:  ACUTE BRONCHITIS (ICD-466.0) treat with a z-pack Her updated medication list for this problem includes:    Symbicort 80-4.5 Mcg/act Aero (Budesonide-formoterol fumarate) .[name] Kitchen..[name] Kitchen Two puffs two times a day    Albuterol Sulfate (2.5 Mg/27ml) 0.083% Nebu (Albuterol sulfate) .[name] KitchenMarland KitchenMarland KitchenMarland Kitchen 17ml in nebulizer every 6 hours as needed    Zithromax Z-pak 250 Mg Tabs (Azithromycin) .[name] Kitchen... Take as directed  Problem # 4:  DIABETES MELLITUS, TYPE II (ICD-250.00) check HgbA1c and basic metabolic panel - sodium was low last blood draw Her updated medication list for this problem includes:    Lisinopril 10 Mg Tabs (Lisinopril) .[name] Kitchen... Take 1 tablet by mouth once a day    Metformin Hcl 1000 Mg Tabs (Metformin hcl) .[name] Kitchen... Take 1 tablet by mouth two times a day    Lantus Solostar 100 Unit/ml Soln (Insulin glargine) .[name] KitchenMarland KitchenMarland KitchenMarland Kitchen 50 units every night    Novolog Flexpen 100 Unit/ml Soln (Insulin aspart) .[name] Kitchen... Per sliding scale    Glucotrol 10 Mg Tabs (Glipizide) .[name] Kitchen... Take 1 tablet by mouth two times a day  Orders: T-Basic Metabolic Panel (99991111) T-Hgb A1C (in-house)  [phone])  Other Orders: Est. Patient Level IV [id number]) Future Orders: T-CD4SP ([facility]) (CD4SP) ... 10/20/2009 T-HIV Viral Load [phone]) ... 10/20/2009 T-Comprehensive Metabolic Panel ([id number]) ... 10/20/2009 T-CBC w/Diff [id number]) ... 10/20/2009 T-Lipid Profile [id number]) ... 10/20/2009  Patient Instructions: 1)  Please schedule a follow-up appointment in 3 months, 2 WEEKS AFTER LABS. 2)  Schedule in PAP clinic  Prescriptions: ZITHROMAX Z-PAK 250 MG TABS (AZITHROMYCIN) take as directed  #1 pack x 0   Entered and Authorized by:   Aldona Bar MD   Signed by:   Aldona Bar MD on 07/22/2009   Method used:   Print then Give to Patient   RxID:   [id number] VICODIN 5-500 MG TABS (HYDROCODONE-ACETAMINOPHEN) Take 1 tablet by mouth every 8 hours as needed  #30 x 0   Entered and Authorized by:   Aldona Bar MD  Signed by:   Aldona Bar MD on 07/22/2009   Method used:   Print then Give to Patient   RxID:   [id number]

## 2010-01-31 NOTE — Progress Notes (Signed)
Summary: RX refill  Phone Note Call from Patient   Caller: Patient Call For: Dr. Tomma Lightning Reason for Call: Refill Medication Details for Reason: c/o left side pain Summary of Call: Pt. called requesting refill for Hydrocodone for left side pain from coughing.  Said her cough has improved and feels alot better, but feels like she has pulled muscles in left side from all the coughing she did and is having alot of pain. Initial call taken by: [name] CMA Deborra Medina),  June 28, 2009 4:57 PM  Follow-up for Phone Call        ok x 1 Follow-up by: Aldona Bar MD,  June 29, 2009 9:35 AM    Prescriptions: VICODIN 5-500 MG TABS (HYDROCODONE-ACETAMINOPHEN) Take 1 tablet by mouth every 8 hours as needed  #30 x 0   Entered by:   [name] CMA ( [place])   Authorized by:   Aldona Bar MD   Signed by:   [name] CMA ( [place]) on 06/29/2009   Method used:   Telephoned to ...       [phone]* (retail)       [address]       [place], [place]  [address]       Ph: [phone]       Fax: [phone]   RxID:   [id number]

## 2010-01-31 NOTE — Progress Notes (Signed)
Summary: Increase in lantus  Phone Note Call from Patient   Caller: Patient Summary of Call:  Patient came in to sign the forms to obtain medications not on the [facility] $4 list.  She had a question about the dosing of her Lantus.  She said she had been increasing her Lantus to 50 units twice a day.  She is wanting to know if she needs to be seen since doing this.  Please let me know before I reorder via patient assistance program or if she needs to come in and be evaluated. I don't want it to run out if ordered via pap. Initial call taken by: Canary Brim  BS,CPht II,MPH,  September 29, 2009 4:52 PM  Follow-up for Phone Call        the dose change is ok no visit is needed Follow-up by: Aldona Bar MD,  September 30, 2009 9:45 AM  Additional Follow-up for Phone Call Additional follow up Details #1::        ok I will update in EMR what patient said she is taking so there will not be any confusion and so the patient will not run out of this medication.  The application will be mailed off today. Additional Follow-up by: Canary Brim  BS,CPht II,MPH,  September 30, 2009 11:43 AM    New/Updated Medications: LANTUS SOLOSTAR 100 UNIT/ML SOLN (INSULIN GLARGINE) 50 units twice a day

## 2010-01-31 NOTE — Progress Notes (Signed)
Summary: med refill  Phone Note Refill Request Message from:  Patient on January 07, 2009 3:06 PM  Refills Requested: Medication #1:  PROTONIX 40 MG SOLR Take 1 tablet by mouth two times a day   Dosage confirmed as above?Dosage Confirmed   Brand Name Necessary? No   Supply Requested: 1 month  Method Requested: Telephone to Pharmacy    Prescriptions: PROTONIX 40 MG SOLR (PANTOPRAZOLE SODIUM) Take 1 tablet by mouth two times a day  #60 x 5   Entered by:   Barbra Sarks RN   Authorized by:   Aldona Bar MD   Signed by:   Aldona Bar MD on 01/07/2009   Method used:   Telephoned to ...       [facility] (retail)       [address] [place],   [address]       Ph: [phone]       Fax: [phone]   RxID:   [id number]

## 2010-01-31 NOTE — Progress Notes (Signed)
Summary: med refill  Phone Note Refill Request Message from:  Fax from Pharmacy on January 18, 2009 11:52 AM  Refills Requested: Medication #1:  ATRIPLA 600-200-300 MG TABS Take 1 tablet by mouth at bedtime   Dosage confirmed as above?Dosage Confirmed   Brand Name Necessary? No   Supply Requested: 1 month   Last Refilled: 12/13/2008  Method Requested: Telephone to Pharmacy Next Appointment Scheduled: seen 12/23/08 Initial call taken by: Barbra Sarks RN,  January 18, 2009 11:53 AM    Prescriptions: ATRIPLA 600-200-300 MG TABS (EFAVIRENZ-EMTRICITAB-TENOFOVIR) Take 1 tablet by mouth at bedtime  #30 x 5   Entered by:   Barbra Sarks RN   Authorized by:   Aldona Bar MD   Signed by:   Aldona Bar MD on 01/18/2009   Method used:   Telephoned to ...       [facility] (mail-order)       [address] [place]  [address]       Ph: [phone]       Fax: [phone]   RxIDVW:[id number]

## 2010-01-31 NOTE — Miscellaneous (Signed)
Summary: Orders Update  Clinical Lists Changes  Orders: Added new Test order of T-Basic Metabolic Panel ([id number]) - Signed     Process Orders Check Orders Results:     [facility]: [id number] not required for this insurance Tests Sent for requisitioning (July 12, 2009 3:46 PM):     07/12/2009: [facility] -- T-Basic Metabolic Panel 0000000 (signed)

## 2010-01-31 NOTE — Assessment & Plan Note (Signed)
Summary: COUGH/VS   CC:  pt. c/o worsening cough and improved for a couple days then worsened.  History of Present Illness: Pt states that her cough is worse. She started to feel better after starting the antibiotic but then started feeling worse.  She has been unable to sleep due to her cough.  She has been running a low grade fever as well. Feels SOB when walking.  Preventive Screening-Counseling & Management  Alcohol-Tobacco     Alcohol drinks/day: 0     Smoking Status: current     Smoking Cessation Counseling: yes     Packs/Day: 1.0  Caffeine-Diet-Exercise     Caffeine use/day: 2     Does Patient Exercise: no     Type of exercise: walks     Exercise (avg: min/session): 30-60     Times/week: 4  Safety-Violence-Falls     Seat Belt Use: yes      Sexual History:  currently monogamous.        Drug Use:  former.     Updated Prior Medication List: ATRIPLA 600-200-300 MG TABS (EFAVIRENZ-EMTRICITAB-TENOFOVIR) Take 1 tablet by mouth at bedtime LISINOPRIL 10 MG TABS (LISINOPRIL) Take 1 tablet by mouth once a day HYDROCHLOROTHIAZIDE 25 MG TABS (HYDROCHLOROTHIAZIDE) Take 1 tablet by mouth once a day METFORMIN HCL 1000 MG TABS (METFORMIN HCL) Take 1 tablet by mouth two times a day SYMBICORT 80-4.5 MCG/ACT AERO (BUDESONIDE-FORMOTEROL FUMARATE) two puffs two times a day CARBAMAZEPINE 200 MG TABS (CARBAMAZEPINE) Take 2  tablets by mouth two times a day LANTUS SOLOSTAR 100 UNIT/ML SOLN (INSULIN GLARGINE) 50 units every night NOVOLOG FLEXPEN 100 UNIT/ML SOLN (INSULIN ASPART) per sliding scale CELEBREX 100 MG CAPS (CELECOXIB) Take 1 tablet by mouth two times a day TRAMADOL HCL 50 MG TABS (TRAMADOL HCL) Take 1 tablet by mouth every 8 hours as needed GLUCOTROL 10 MG TABS (GLIPIZIDE) Take 1 tablet by mouth two times a day FLUCONAZOLE 100 MG TABS (FLUCONAZOLE) Take 1 tablet by mouth once a day PROTONIX 40 MG SOLR (PANTOPRAZOLE SODIUM) Take 1 tablet by mouth two times a day * ALLEGRA 180 MG  Take one table by mouth every day. VICODIN 5-500 MG TABS (HYDROCODONE-ACETAMINOPHEN) Take 1 tablet by mouth every 8 hours as needed LOPID 600 MG TABS (GEMFIBROZIL) Take 1 tablet by mouth two times a day 1/2 hour before meals ALBUTEROL SULFATE (2.5 MG/3ML) 0.083% NEBU (ALBUTEROL SULFATE) 17ml in nebulizer every 6 hours as needed CIPROFLOXACIN HCL 750 MG TABS (CIPROFLOXACIN HCL) Take 1 tablet by mouth two times a day ZITHROMAX Z-PAK 250 MG TABS (AZITHROMYCIN) take as directed  Current Allergies (reviewed today): No known allergies  Past History:  Past Medical History: Last updated: 04/19/2008 Depression Diabetes mellitus, type II HIV disease Hypertension Peripheral neuropathy  Review of Systems       The patient complains of anorexia, fever, and dyspnea on exertion.  The patient denies chest pain and hemoptysis.    Vital Signs:  Patient profile:   45 year old female Menstrual status:  irregular Height:      70 inches (177.80 cm) Weight:      221.4 pounds (100.64 kg) BMI:     31.88 O2 Sat:      93 % on Room air Temp:     98.5 degrees F (36.94 degrees C) oral Pulse rate:   116 / minute BP sitting:   139 / 79  (right arm)  Vitals Entered By: [name] CMA Deborra Medina) (June 01, 2009 3:31 PM)  O2 Flow:  Room air CC: pt. c/o worsening cough, improved for a couple days then worsened Is Patient Diabetic? Yes Did you bring your meter with you today? No Pain Assessment Patient in pain? yes     Location: chest, left side Intensity: 10 Type: sharp Onset of pain  Constant Nutritional Status BMI of > 30 = obese Nutritional Status Detail appetite "poor"  Does patient need assistance? Functional Status Self care Ambulation Normal Comments no missed doses of meds per patient   Physical Exam  General:  alert, well-developed, well-nourished, and well-hydrated.   Head:  normocephalic and atraumatic.   Mouth:  pharynx pink and moist.   Lungs:  scattered expiratory  wheezes bilaterally no crackles.     Impression & Recommendations:  Problem # 1:  ACUTE BRONCHITIS (ICD-466.0) vs. Pneumonia - obtain CXR Will treat with cipro and azithromycin Solumedrol 125mg  IM Pt to monitor BS closely Her updated medication list for this problem includes:    Symbicort 80-4.5 Mcg/act Aero (Budesonide-formoterol fumarate) .[name] Kitchen..[name] Kitchen Two puffs two times a day    Albuterol Sulfate (2.5 Mg/51ml) 0.083% Nebu (Albuterol sulfate) .[name] KitchenMarland KitchenMarland KitchenMarland Kitchen 13ml in nebulizer every 6 hours as needed    Ciprofloxacin Hcl 750 Mg Tabs (Ciprofloxacin hcl) .[name] Kitchen... Take 1 tablet by mouth two times a day    Zithromax Z-pak 250 Mg Tabs (Azithromycin) .[name] Kitchen... Take as directed  Orders: Est. Patient Level III [id number]) CXR- 2view (CXR) Solumedrol up to 125mg  [id number]) Admin of Therapeutic Inj  intramuscular or subcutaneous [id number])  Medications Added to Medication List This Visit: 1)  Ciprofloxacin Hcl 750 Mg Tabs (Ciprofloxacin hcl) .... Take 1 tablet by mouth two times a day 2)  Zithromax Z-pak 250 Mg Tabs (Azithromycin) .... Take as directed Prescriptions: VICODIN 5-500 MG TABS (HYDROCODONE-ACETAMINOPHEN) Take 1 tablet by mouth every 8 hours as needed  #30 x 0   Entered and Authorized by:   Aldona Bar MD   Signed by:   Aldona Bar MD on 06/01/2009   Method used:   Print then Give to Patient   RxID:   [id number] CIPROFLOXACIN HCL 750 MG TABS (CIPROFLOXACIN HCL) Take 1 tablet by mouth two times a day  #28 x 0   Entered and Authorized by:   Aldona Bar MD   Signed by:   Aldona Bar MD on 06/01/2009   Method used:   Print then Give to Patient   RxID:   [id number] ZITHROMAX Z-PAK 250 MG TABS (AZITHROMYCIN) take as directed  #1 pack x 0   Entered and Authorized by:   Aldona Bar MD   Signed by:   Aldona Bar MD on 06/01/2009   Method used:   Print then Give to Patient   RxID:   [id number]    Medication Administration  Injection # 1:    Medication: Solumedrol up to 125mg      Diagnosis: ACUTE BRONCHITIS (ICD-466.0)    Route: IM    Site: LUOQ gluteus    Exp Date: 11/2011    Lot #: [id number]    Mfr: Pfizer    Patient tolerated injection without complications    Given by: [name] CMA Deborra Medina) (June 01, 2009 4:27 PM)  Orders Added: 1)  Est. Patient Level III [id number] 2)  CXR- 2view [CXR] 3)  Solumedrol up to 125mg  [J2930] 4)  Admin of Therapeutic Inj  intramuscular or subcutaneous [id number]

## 2010-01-31 NOTE — Miscellaneous (Signed)
  Clinical Lists Changes  Orders: Added new Referral order of Diabetic Clinic Referral (Diabetic) - Signed Observations: Added new observation of OTHER COMMEN: Hyperlipidemia (11/16/2009 9:30) Added new observation of [place]: Neuropathy (11/16/2009 9:30)      Diabetes Self Management Training Referral Patient Name: [name] Date Of Birth: [birth year] MRN: [id number] Current Diagnosis:  SCREENING FOR MALIGNANT NEOPLASM OF THE CERVIX (ICD-V76.2) HYPERLIPIDEMIA (ICD-272.4) ACUTE BRONCHITIS (ICD-466.0) DIARRHEA (ICD-787.91) FATIGUE (ICD-780.79) THRUSH (ICD-112.0) PERIPHERAL NEUROPATHY (ICD-356.9) HYPERTENSION (ICD-401.9) HIV DISEASE (ICD-042) DIABETES MELLITUS, TYPE II (ICD-250.00) DEPRESSION (123456)   Complicating Conditions:  Neuropathy  Hyperlipidemia

## 2010-01-31 NOTE — Progress Notes (Signed)
Summary: Novolog Flex Pen PAP application  Phone Note From Pharmacy   Caller: [facility] [place] PAP Reason for Call: Needs renewal Summary of Call: Renewal fo PAP for Novolog Flex Pen.  Application needing amount of Sliding Scale insulin used on a daily basis to be included in the application.  Reapplication needed for Novemer 2011.  Langley Gauss, RN has the application information. Lorne Skeens RN  October 17, 2009 11:44 AM      Appended Document: Novolog Flex Pen PAP application Pt returned call.  She uses form 15-20 units daily.   Laverle Patter, RN   Appended Document: Novolog Flex Pen PAP application Application updated with above information and faxed to [phone].  PAP app. to be scanned in to EMR.

## 2010-01-31 NOTE — Miscellaneous (Signed)
Summary: clinical update/ryan white  Clinical Lists Changes  Observations: Added new observation of CASE MGM: [name] (07/22/2009 15:12) Added new observation of PAYOR: No Insurance (07/22/2009 15:12) Added new observation of AIDSDAP: Yes 2011 (07/22/2009 15:12) Added new observation of PCTFPL: 48.75  (07/22/2009 15:12) Added new observation of INCOMESOURCE: Unemployment  (07/22/2009 15:12) Added new observation of HOUSEINCOME: 5280  (07/22/2009 15:12) Added new observation of FINASSESSDT: 07/22/2009  (07/22/2009 15:12) Added new observation of RW VITAL STA: Active  (07/22/2009 15:12)

## 2010-01-31 NOTE — Miscellaneous (Signed)
Summary: Office Visit ([facility])    Vital Signs:  Patient profile:   45 year old female Menstrual status:  irregular Weight:      232.9 pounds Temp:     98.7 degrees F oral Pulse rate:   101 / minute Pulse rhythm:   regular Resp:     20 per minute BP sitting:   118 / 79  (left arm)  Vitals Entered By: Barbra Sarks RN (March 24, 2009 4:14 PM) CC: f/u 05 Is Patient Diabetic? Yes Did you bring your meter with you today? No Pain Assessment Patient in pain? no       Does patient need assistance? Functional Status Self care Ambulation Normal   CC:  f/u 05.  History of Present Illness: Continues to feel tired but thinks she is doing to much.  No missed doses of her Atripla.  Preventive Screening-Counseling & Management  Alcohol-Tobacco     Alcohol drinks/day: 0     Smoking Status: current     Smoking Cessation Counseling: yes     Packs/Day: 1.0  Caffeine-Diet-Exercise     Caffeine use/day: 4     Does Patient Exercise: yes     Type of exercise: walks     Exercise (avg: min/session): 30-60     Times/week: 4  Problems Prior to Update: 1)  Hyperlipidemia  (ICD-272.4) 2)  Acute Bronchitis  (ICD-466.0) 3)  Diarrhea  (ICD-787.91) 4)  Fatigue  (ICD-780.79) 5)  Thrush  (ICD-112.0) 6)  Peripheral Neuropathy  (ICD-356.9) 7)  Hypertension  (ICD-401.9) 8)  HIV Disease  (ICD-042) 9)  Diabetes Mellitus, Type II  (ICD-250.00) 10)  Depression  (ICD-311)  Current Medications (verified): 1)  Atripla 600-200-300 Mg Tabs (Efavirenz-Emtricitab-Tenofovir) .... Take 1 Tablet By Mouth At Bedtime 2)  Lisinopril 10 Mg Tabs (Lisinopril) .... Take 1 Tablet By Mouth Once A Day 3)  Hydrochlorothiazide 25 Mg Tabs (Hydrochlorothiazide) .... Take 1 Tablet By Mouth Once A Day 4)  Metformin Hcl 1000 Mg Tabs (Metformin Hcl) .... Take 1 Tablet By Mouth Two Times A Day 5)  Symbicort 80-4.5 Mcg/act Aero (Budesonide-Formoterol Fumarate) .... Two Puffs Two Times A Day 6)  Carbamazepine 200  Mg Tabs (Carbamazepine) .... Take 2  Tablets By Mouth Two Times A Day 7)  Lantus Solostar 100 Unit/ml Soln (Insulin Glargine) .... 50 Units Every Night 8)  Novolog Flexpen 100 Unit/ml Soln (Insulin Aspart) .... Per Sliding Scale 9)  Celebrex 100 Mg Caps (Celecoxib) .... Take 1 Tablet By Mouth Two Times A Day 10)  Tramadol Hcl 50 Mg Tabs (Tramadol Hcl) .... Take 1 Tablet By Mouth Every 8 Hours As Needed 11)  Glucotrol 10 Mg Tabs (Glipizide) .... Take 1 Tablet By Mouth Two Times A Day 12)  Fluconazole 100 Mg Tabs (Fluconazole) .... Take 1 Tablet By Mouth Once A Day 13)  Protonix 40 Mg Solr (Pantoprazole Sodium) .... Take 1 Tablet By Mouth Two Times A Day 14)  Allegra 180 Mg .... Take One Table By Mouth Every Day. 15)  Zithromax Z-Pak 250 Mg Tabs (Azithromycin) .... Take As Directed 16)  Vicodin 5-500 Mg Tabs (Hydrocodone-Acetaminophen) .... Take 1 Tablet By Mouth Every 8 Hours As Needed 17)  Lopid 600 Mg Tabs (Gemfibrozil) .... Take 1 Tablet By Mouth Two Times A Day 1/2 Hour Before Meals  Allergies (verified): No Known Drug Allergies   Review of Systems  The patient denies anorexia, fever, weight loss, chest pain, and dyspnea on exertion.     Physical Exam  General:  alert, well-developed, well-nourished, and well-hydrated.   Head:  normocephalic and atraumatic.   Mouth:  pharynx pink and moist.   Lungs:  normal breath sounds.     Impression & Recommendations:  Problem # 1:  HIV DISEASE (ICD-042) Pt doing well. She is to continue current meds and f/u in 3 months. Diagnostics Reviewed:  HIV: CDC-defined AIDS (04/19/2008)   CD4: 199 (04/19/2008)   WBC: 9.4 (03/22/2009)   Hgb: 11.9 (03/22/2009)   HCT: 36.0 (03/22/2009)   Platelets: 395 (03/22/2009) HIV-1 RNA: <48 copies/mL (03/22/2009)   HBSAg: NO (02/25/2006)  Problem # 2:  HYPERLIPIDEMIA (ICD-272.4)  will start lopid and repeat labs in 3 months. Her updated medication list for this problem includes:    Lopid 600 Mg Tabs  (Gemfibrozil) .[name] Kitchen... Take 1 tablet by mouth two times a day 1/2 hour before meals  Labs Reviewed: SGOT: 12 (03/22/2009)   SGPT: 13 (03/22/2009)   HDL:39 (03/22/2009), 39 (12/20/2008)  LDL:See Comment mg/dL (03/22/2009), See Comment mg/dL (12/20/2008)  Chol:307 (03/22/2009), 286 (12/20/2008)  Trig:662 (03/22/2009), 542 (12/20/2008)  Problem # 3:  DIABETES MELLITUS, TYPE II (ICD-250.00) needs HgbA1c. Her updated medication list for this problem includes:    Lisinopril 10 Mg Tabs (Lisinopril) .[name] Kitchen... Take 1 tablet by mouth once a day    Metformin Hcl 1000 Mg Tabs (Metformin hcl) .[name] Kitchen... Take 1 tablet by mouth two times a day    Lantus Solostar 100 Unit/ml Soln (Insulin glargine) .[name] KitchenMarland KitchenMarland KitchenMarland Kitchen 50 units every night    Novolog Flexpen 100 Unit/ml Soln (Insulin aspart) .[name] Kitchen... Per sliding scale    Glucotrol 10 Mg Tabs (Glipizide) .[name] Kitchen... Take 1 tablet by mouth two times a day  Medications Added to Medication List This Visit: 1)  Lopid 600 Mg Tabs (Gemfibrozil) .... Take 1 tablet by mouth two times a day 1/2 hour before meals   Patient Instructions: 1)  Please schedule a follow-up appointment in 3 months. Prescriptions: LOPID 600 MG TABS (GEMFIBROZIL) Take 1 tablet by mouth two times a day 1/2 hour before meals  #60 x 5   Entered and Authorized by:   Aldona Bar MD   Signed by:   Aldona Bar MD on 03/24/2009   Method used:   Print then Give to Patient   RxID:   [id number] FLUCONAZOLE 100 MG TABS (FLUCONAZOLE) Take 1 tablet by mouth once a day  #30 x 3   Entered and Authorized by:   Aldona Bar MD   Signed by:   Aldona Bar MD on 03/24/2009   Method used:   Print then Give to Patient   RxID:   [id number] GLUCOTROL 10 MG TABS (GLIPIZIDE) Take 1 tablet by mouth two times a day  #60 x 5   Entered and Authorized by:   Aldona Bar MD   Signed by:   Aldona Bar MD on 03/24/2009   Method used:   Print then Give to Patient   RxID:   [id number] TRAMADOL HCL 50 MG TABS (TRAMADOL HCL)  Take 1 tablet by mouth every 8 hours as needed  #90 x 5   Entered and Authorized by:   Aldona Bar MD   Signed by:   Aldona Bar MD on 03/24/2009   Method used:   Print then Give to Patient   RxID:   [id number] NOVOLOG FLEXPEN 100 UNIT/ML SOLN (INSULIN ASPART) per sliding scale  #77ml x 5   Entered and Authorized by:   Aldona Bar MD   Signed by:   Aldona Bar  MD on 03/24/2009   Method used:   Print then Give to Patient   RxID:   [id number] LANTUS SOLOSTAR 100 UNIT/ML SOLN (INSULIN GLARGINE) 50 units every night  #8ml pen  (5) x 5   Entered and Authorized by:   Aldona Bar MD   Signed by:   Aldona Bar MD on 03/24/2009   Method used:   Print then Give to Patient   RxID:   [id number] CARBAMAZEPINE 200 MG TABS (CARBAMAZEPINE) Take 2  tablets by mouth two times a day  #120 x 5   Entered and Authorized by:   Aldona Bar MD   Signed by:   Aldona Bar MD on 03/24/2009   Method used:   Print then Give to Patient   RxID:   [id number] SYMBICORT 80-4.5 MCG/ACT AERO (BUDESONIDE-FORMOTEROL FUMARATE) two puffs two times a day  #1 x 5   Entered and Authorized by:   Aldona Bar MD   Signed by:   Aldona Bar MD on 03/24/2009   Method used:   Print then Give to Patient   RxID:   [id number] METFORMIN HCL 1000 MG TABS (METFORMIN HCL) Take 1 tablet by mouth two times a day  #60 x 5   Entered and Authorized by:   Aldona Bar MD   Signed by:   Aldona Bar MD on 03/24/2009   Method used:   Print then Give to Patient   RxID:   [id number] HYDROCHLOROTHIAZIDE 25 MG TABS (HYDROCHLOROTHIAZIDE) Take 1 tablet by mouth once a day  #30 x 5   Entered and Authorized by:   Aldona Bar MD   Signed by:   Aldona Bar MD on 03/24/2009   Method used:   Print then Give to Patient   RxID:   [id number] LISINOPRIL 10 MG TABS (LISINOPRIL) Take 1 tablet by mouth once a day  #30 x 5   Entered and Authorized by:   Aldona Bar MD   Signed by:   Aldona Bar  MD on 03/24/2009   Method used:   Print then Give to Patient   RxID:   [id number]

## 2010-01-31 NOTE — Progress Notes (Signed)
Summary: Patient Assistance  Phone Note Outgoing Call   Summary of Call: Pt left message stating she needed assistance in getting test strips and for her diflucan.  [facility] will only give her 1 tab for $4.00 and she takes it daily.  Butch Penny, diabetes educator stated that the [facility] [place] give her a meter with strips for $5.00.  Advised patient of this and asked her while she was there to ask them about the diflucan to see if she can get it there for cheaper and if not to contact me and we will look into other solutions.  [employer] does not have a patient assistance program for diflucan Initial call taken by: Meyer Cory,  October 05, 2009 9:43 AM

## 2010-01-31 NOTE — Assessment & Plan Note (Signed)
Summary: DIABETES TEACHING/CFB   Vital Signs:  Patient profile:   45 year old female Menstrual status:  irregular Weight:      248.9 pounds BMI:     35.84 Is Patient Diabetic? Yes Did you bring your meter with you today? No CBG Device ID presto from [facility]   Allergies: No Known Drug Allergies   Complete Medication List: 1)  Atripla [id number] Mg Tabs (Efavirenz-emtricitab-tenofovir) .... Take 1 tablet by mouth at bedtime 2)  Lisinopril 10 Mg Tabs (Lisinopril) .... Take 1 tablet by mouth once a day 3)  Hydrochlorothiazide 25 Mg Tabs (Hydrochlorothiazide) .... Take 1 tablet by mouth once a day 4)  Metformin Hcl 1000 Mg Tabs (Metformin hcl) .... Take 1 tablet by mouth two times a day 5)  Symbicort 80-4.5 Mcg/act Aero (Budesonide-formoterol fumarate) .... Two puffs two times a day 6)  Carbamazepine 200 Mg Tabs (Carbamazepine) .... Take 2  tablets by mouth two times a day 7)  Novolog Flexpen 100 Unit/ml Soln (Insulin aspart) .... Per sliding scale 8)  Celebrex 100 Mg Caps (Celecoxib) .... Take 1 tablet by mouth two times a day 9)  Tramadol Hcl 50 Mg Tabs (Tramadol hcl) .... Take 1 tablet by mouth every 8 hours as needed 10)  Glucotrol 10 Mg Tabs (Glipizide) .... Take 1 tablet by mouth two times a day 11)  Fluconazole 100 Mg Tabs (Fluconazole) .... Take 1 tablet by mouth once a day 12)  Protonix 40 Mg Solr (Pantoprazole sodium) .... Take 1 tablet by mouth two times a day 13)  Vicodin 5-500 Mg Tabs (Hydrocodone-acetaminophen) .... Take 1 tablet by mouth every 8 hours as needed 14)  Lopid 600 Mg Tabs (Gemfibrozil) .... Take 1 tablet by mouth two times a day 1/2 hour before meals 15)  Albuterol Sulfate (2.5 Mg/1ml) 0.083% Nebu (Albuterol sulfate) .... 32ml in nebulizer every 6 hours as needed 16)  Zithromax Z-pak 250 Mg Tabs (Azithromycin) .... Take as directed 17)  Xyzal 5 Mg Tabs (Levocetirizine dihydrochloride) .... Take 1 tablet by mouth once a day 18)  Lantus  Solostar 100 Unit/ml Soln (Insulin glargine) .... 50 units twice a day  Other Orders: T-Hgb A1C (in-house) [phone]) DSMT(Medicare) Individual, 30 Minutes ([id number])   Orders Added: 1)  T-Hgb A1C (in-house) [id number] 2)  DSMT(Medicare) Individual, 30 Minutes [id number]    Laboratory Results   Blood Tests   Date/Time Received: November 21, 2009 9:43 AM Date/Time Reported: Maryan Rued  November 21, 2009 9:43 AM   HGBA1C: 7.4%   (Normal Range: Non-Diabetic - 3-6%   Control Diabetic - 6-8%)      Diabetes Self Management Training  PCP: Aldona Bar MD Date diagnosed with diabetes: 01/01/1998 Diabetes Type: Type 2 insulin Current smoking Status: current  Vital Signs Todays Weight: 248.9lb  in BMI 35.84in-lbs      Assessment Daily activities: in school on line for psychology Sources of Support: fiance Special needs or Barriers: fear motivating patient to take care of her diabetes- lives with mother right now who has amputation from diabetes and sores on other leg, maintains high A1C and poor eating habits Learns best by: loves to read # of people in household: 3  Potential Barriers  Economic/Supplies  Demonstrates competency  Coping with Diabetes Feelings about Diabetes: fearful that she'll end up like her mother if she doesn;t care for it  Diabetes Medications:  Comments: uses Novolog occassionally when she eats higher carbs and treats herself, uses fruit as sweets, eating zone diet- higher  protein and healthy fats- nuts and olive oil.   Long Acting  Insulin Type:Lantus  Bedtime Dose: 50    Monitoring Self monitoring blood glucose 4 times a day Name of Meter  presto from [facility]  Recent Episodes of: Requiring Help from another person  Hyperglycemia : No Hypoglycemia: No Severe Hypoglycemia : No  Other Assessment Had an amputation due to diabetes? No Ever had a foot ulcer or infection?           No Performs daily self-foot exams:  Yes     Estimated /Usual Carb Intake Breakfast # of Carbs/Grams two meals a day Lunch # of Carbs/Grams eats cerreal Dinner # of Carbs/Grams occassiolly goes out- [employer] pizza, eats lot;s of salads  Nutrition assessment Weight change: Gain Do you read food labels?                                                                          Yes What do you look at?                                                                                                                 reads for carbs and protein,   Activity Limitations  Inadequate physical activity Diabetes Disease Process  Discussed today Define diabetes in simple terms: Needs review/assistanceState own type of diabetes: Needs review/assistance   State diabetes is treated by meal plan-exercise-medication-monitoring-education: Demonstrates competency Medications State name-action-dose-duration-side effects-and time to take medication: Needs review/assistance   State insulin adjustment guidelines: Needs review/assistance    Nutritional Management  Monitoring State purpose and frequency of monitoring BG-ketones-HgbA1C  : Demonstrates competencyPerform glucose monitoring/ketone testing and record results correctly: Demonstrates competencyState target blood glucose and HgbA1C goals: Needs review/assistance    Complications State the causes-signs and symptoms and prevention of Hyperglycemia: Demonstrates competencyExplain proper treatment of hyperglycemia: Demonstrates competencyState the causes- signs and symptoms and prevention of hypoglycemia: Needs review/assistanceExplain proper treatment of hypoglycemia: Needs review/assistance          Exercise  Psychosocial Adjustment State three common feelings that might be experienced when learning to cope with diabetes: Needs review/assistanceIdentify two methods to cope with these feelings: Demonstrates competencyDiabetes Management Education Done: 11/21/2009    BEHAVIORAL GOALS  INITIAL Utilizing medications if for therapeutic effectiveness: use wheel to dose Novolog when going to eat carb        Note a1C of 7.4 % - close to goal. patient requested this be done today rather than wait until next week.  Had eye exam 7-8 months ago, will do foot exam next visits. Discussed lipid today- patient reports her lipid panels are not fasting.  Diabetes Self Management Support: fiance doctors Follow-up:3 weeks

## 2010-02-02 NOTE — Progress Notes (Signed)
Summary: requesting something to stop menstruation  Phone Note Call from Patient   Caller: Patient Summary of Call: Pt.  started her period 01/07/10 and is still bleeding, afraid she will still have her period for her honeymoon this Sat.  Wants Dr. to call in something to stop her period.  Said another Dr. had done this for her in the past.  Initial call taken by: [name] CMA Deborra Medina),  January 12, 2010 12:03 PM  Follow-up for Phone Call        I do not know what to prescribe.  Could try depo-provera shot or BCPs. Follow-up by: Aldona Bar MD,  January 12, 2010 2:15 PM  Additional Follow-up for Phone Call Additional follow up Details #1::        Explained to pt. that Dr. Tomma Lightning was not familiar with anything that would stop her menstruation Additional Follow-up by: [name] CMA Deborra Medina),  January 12, 2010 3:33 PM

## 2010-02-02 NOTE — Progress Notes (Signed)
Summary: refill on Vicodin  Phone Note Call from Patient   Caller: Patient Reason for Call: Refill Medication Summary of Call: Pt. called requesting a refill on Vicodin.  Said her side had actually been feeling better but she fell off a ladder and injured it again.  When she was taking it she was having to take two and is requesting an increase in dosage.  Her wedding is next week and she is afraid of being in pain with all that she needs to do.  Requesting RX sent to [facility] on [address]. Initial call taken by: [name] CMA Deborra Medina),  January 04, 2010 4:52 PM  Follow-up for Phone Call        ok # 60  Follow-up by: Aldona Bar MD,  January 05, 2010 2:12 PM  Additional Follow-up for Phone Call Additional follow up Details #1::        RX called to [facility]. Additional Follow-up by: [name] CMA Deborra Medina),  January 05, 2010 2:41 PM

## 2010-02-07 ENCOUNTER — Encounter: Payer: Self-pay | Admitting: Adult Health

## 2010-02-07 ENCOUNTER — Telehealth (INDEPENDENT_AMBULATORY_CARE_PROVIDER_SITE_OTHER): Payer: Self-pay | Admitting: *Deleted

## 2010-02-09 ENCOUNTER — Telehealth: Payer: Self-pay | Admitting: Infectious Diseases

## 2010-02-13 ENCOUNTER — Telehealth (INDEPENDENT_AMBULATORY_CARE_PROVIDER_SITE_OTHER): Payer: Self-pay | Admitting: Licensed Clinical Social Worker

## 2010-02-16 NOTE — Progress Notes (Signed)
Summary: Pt. called about PA meds  Phone Note Call from Patient   Caller: Patient Summary of Call: Pt. called about PA for Tegretol 200 mg. and Lantus Solastar.  Tegretol 200 mg is on back order and Donovan Kail, NP Okd it to be changed to 400 mg one two times a day.  [employer] called and notified,  order # [id number].  Lantus had to be reordered through [facility], order faxed.  Left pt. voicemail. Initial call taken by: [name] CMA ( [place]),  February 07, 2010 11:02 AM     Appended Document: Pt. called about PA meds Carbamazepine changed to Tegretol because pt. receives this medication through pt. assistance and they only cover brand name.

## 2010-02-16 NOTE — Miscellaneous (Signed)
  Clinical Lists Changes  Medications: Changed medication from CARBAMAZEPINE 200 MG TABS (CARBAMAZEPINE) Take 2  tablets by mouth two times a day to TEGRETOL XR 400 MG XR12H-TAB (CARBAMAZEPINE) Take 1 tablet by mouth two times a day

## 2010-02-16 NOTE — Progress Notes (Addendum)
Summary: Problems with pain   Phone Note Call from Patient   Summary of Call: Pt called c/o she can not live without her Tegretol medication. She stated she is a former addict and does not want to result to drinking again.  She is in a lot of pain .   She is awaiting patient assistance for this medication and it may be 7 -10 days before she recieves it.  She has asked [place] to call in a 10 day supply until it is received.    Initial call taken by: Orland Mustard RN,  February 09, 2010 11:31 AM  Follow-up for Phone Call        ok per Dr Johnnye Sima.   Tegretol    400mg  one two times a day # 20.  Verbal order/read back Orland Mustard RN  February 09, 2010 11:32 AM  Follow-up by: Orland Mustard RN,  February 09, 2010 11:32 AM    Prescriptions: TEGRETOL XR 400 MG XR12H-TAB (CARBAMAZEPINE) Take 1 tablet by mouth two times a day  #20 x 0   Entered by:   Orland Mustard RN   Authorized by:   Bobby Rumpf MD   Signed by:   Orland Mustard RN on 02/09/2010   Method used:   Electronically to        [facility] [phone]* (retail)       [address]       [place], [place]  [address]       Ph: [phone]       Fax: [phone]   RxID:   [id number]

## 2010-02-22 ENCOUNTER — Encounter (INDEPENDENT_AMBULATORY_CARE_PROVIDER_SITE_OTHER): Payer: Self-pay | Admitting: Licensed Clinical Social Worker

## 2010-02-22 NOTE — Progress Notes (Signed)
  Phone Note Outgoing Call   Call placed by: Jarrett Ables CMA,  February 13, 2010 11:01 AM Call placed to: Patient Summary of Call: Patient assistance medication arrived today. Tegretol xr 400 patient aware. Initial call taken by: Jarrett Ables CMA,  February 13, 2010 11:05 AM     Appended Document:  Diflucan  100 mg also arrived patient aware.

## 2010-02-28 NOTE — Miscellaneous (Signed)
  Clinical Lists Changes  Medications: Added new medication of DOXYCYCLINE HYCLATE 100 MG TABS (DOXYCYCLINE HYCLATE) 1 tablet two times a day x 10days - Signed Rx of DOXYCYCLINE HYCLATE 100 MG TABS (DOXYCYCLINE HYCLATE) 1 tablet two times a day x 10days;  #20 x 0;  Signed;  Entered by: Jarrett Ables CMA;  Authorized by: Alcide Evener MD;  Method used: Telephoned to [facility] [phone]*, [address], [place], [place]  [address], Ph: [phone], Fax: [phone]    Prescriptions: DOXYCYCLINE HYCLATE 100 MG TABS (DOXYCYCLINE HYCLATE) 1 tablet two times a day x 10days  #20 x 0   Entered by:   Jarrett Ables CMA   Authorized by:   Alcide Evener MD   Signed by:   Jarrett Ables CMA on 02/22/2010   Method used:   Telephoned to ...       [facility] [phone]* (retail)       [address]       [place], [place]  [address]       Ph: [phone]       Fax: [phone]   RxID:   [id number]

## 2010-03-01 ENCOUNTER — Telehealth (INDEPENDENT_AMBULATORY_CARE_PROVIDER_SITE_OTHER): Payer: Self-pay | Admitting: *Deleted

## 2010-03-06 ENCOUNTER — Telehealth (INDEPENDENT_AMBULATORY_CARE_PROVIDER_SITE_OTHER): Payer: Self-pay | Admitting: *Deleted

## 2010-03-09 NOTE — Progress Notes (Signed)
Summary: Symbicort is herer to be picked up.  Phone Note Outgoing Call   Call placed by: Janyce Llanos CMA,  March 01, 2010 9:28 AM Summary of Call: Called patient to let her know that her Symbicort(3) is here and ready to be picked up. Had to leave a message for the patient. Janyce Llanos CMA  March 01, 2010 9:29 AM

## 2010-03-14 ENCOUNTER — Encounter: Payer: Self-pay | Admitting: Adult Health

## 2010-03-14 LAB — T-HELPER CELL (CD4) - (RCID CLINIC ONLY)
CD4 % Helper T Cell: 19 % — ABNORMAL LOW (ref 33–55)
CD4 T Cell Abs: 540 uL (ref 400–2700)

## 2010-03-14 NOTE — Progress Notes (Signed)
Summary: PAP - Lantus arrived, pt. notified  Phone Note Outgoing Call   Call placed by: Lorne Skeens RN,  March 06, 2010 10:37 AM Call placed to: Patient Action Taken: Phone Call Completed, Assistance medications ready for pick up Summary of Call: Lantus SoloStar has arrived.  PAP medications = 30 prefilled pens arrived (6 boxes).  Lot# [id number], Exp date 07/2012.  Pt notified,  Message left.  Lorne Skeens RN  March 06, 2010 10:40 AM

## 2010-03-19 LAB — T-HELPER CELL (CD4) - (RCID CLINIC ONLY): CD4 % Helper T Cell: 16 % — ABNORMAL LOW (ref 33–55)

## 2010-03-21 NOTE — Miscellaneous (Signed)
Summary: [facility]  [facility]   Imported By: Bonner Puna 03/15/2010 10:54:03  _____________________________________________________________________  External Attachment:    Type:   Image     Comment:   External Document

## 2010-03-27 ENCOUNTER — Other Ambulatory Visit: Payer: Self-pay | Admitting: *Deleted

## 2010-03-28 ENCOUNTER — Other Ambulatory Visit: Payer: Self-pay | Admitting: *Deleted

## 2010-03-28 DIAGNOSIS — G629 Polyneuropathy, unspecified: Secondary | ICD-10-CM

## 2010-03-28 MED ORDER — GLIPIZIDE 10 MG PO TABS: 10.0000 mg | ORAL_TABLET | Freq: Two times a day (BID) | ORAL | Status: DC

## 2010-03-28 MED ORDER — TRAMADOL HCL 50 MG PO TABS: 50.0000 mg | ORAL_TABLET | Freq: Three times a day (TID) | ORAL | Status: DC | PRN

## 2010-04-10 ENCOUNTER — Telehealth: Payer: Self-pay | Admitting: *Deleted

## 2010-04-10 NOTE — Telephone Encounter (Signed)
LM for pt that I will handle refill. Called [facility] for her protonix . Since Lysle Pearl is thelisted md, they cannot refill until we send back a form with our md & his numbers. Then the order will be processed. This PAP runs out 07/07/10. Will ask md to sign & will fax back today

## 2010-04-12 ENCOUNTER — Telehealth: Payer: Self-pay | Admitting: Adult Health

## 2010-04-12 NOTE — Telephone Encounter (Signed)
Re-order form with new provider information was faxed to [facility].  The order will be shipped and should be received within 7 - 10 days.Francene Boyers

## 2010-04-14 ENCOUNTER — Encounter: Payer: Self-pay | Admitting: Infectious Diseases

## 2010-04-14 ENCOUNTER — Ambulatory Visit (INDEPENDENT_AMBULATORY_CARE_PROVIDER_SITE_OTHER): Payer: Self-pay | Admitting: Infectious Diseases

## 2010-04-14 DIAGNOSIS — I10 Essential (primary) hypertension: Secondary | ICD-10-CM

## 2010-04-14 DIAGNOSIS — E1142 Type 2 diabetes mellitus with diabetic polyneuropathy: Secondary | ICD-10-CM

## 2010-04-14 DIAGNOSIS — Z720 Tobacco use: Secondary | ICD-10-CM | POA: Insufficient documentation

## 2010-04-14 DIAGNOSIS — G609 Hereditary and idiopathic neuropathy, unspecified: Secondary | ICD-10-CM

## 2010-04-14 DIAGNOSIS — E1140 Type 2 diabetes mellitus with diabetic neuropathy, unspecified: Secondary | ICD-10-CM

## 2010-04-14 DIAGNOSIS — E119 Type 2 diabetes mellitus without complications: Secondary | ICD-10-CM

## 2010-04-14 DIAGNOSIS — F17200 Nicotine dependence, unspecified, uncomplicated: Secondary | ICD-10-CM

## 2010-04-14 DIAGNOSIS — E1149 Type 2 diabetes mellitus with other diabetic neurological complication: Secondary | ICD-10-CM

## 2010-04-14 DIAGNOSIS — B20 Human immunodeficiency virus [HIV] disease: Secondary | ICD-10-CM

## 2010-04-14 DIAGNOSIS — B370 Candidal stomatitis: Secondary | ICD-10-CM

## 2010-04-14 HISTORY — DX: Nicotine dependence, unspecified, uncomplicated: F17.200

## 2010-04-14 MED ORDER — CARBAMAZEPINE ER 200 MG PO TB12: 600.0000 mg | ORAL_TABLET | Freq: Two times a day (BID) | ORAL | Status: DC

## 2010-04-14 NOTE — Progress Notes (Signed)
  Subjective:    Patient ID: [name], female    DOB: 1965-02-25, 45 y.o.   MRN: [id number]  HPI 45 yo F with hx of HIV+ on [place]. Continues to smoke, is currently having her menses and needs to have repeat PAP. No problems with depression. Has gotten married, has condoms. Continues to have neuropathy, had a problem getting her ultram rx correct due to med changes with [facility]. Her FSGs have been 115-120 in daytime, 140-150 at HS. AIC 7 last check. Her last CD4 540 and Vl <20. Trig 593, Chol 301 (Nov 2011). Had brief period where she was off protonix and was miserable.    Review of Systems  Psychiatric/Behavioral: Negative for dysphoric mood.       Objective:   Physical Exam  Constitutional: She appears well-developed and well-nourished.  Eyes: EOM are normal. Pupils are equal, round, and reactive to light.  Neck: Neck supple.  Cardiovascular: Normal rate, regular rhythm and normal heart sounds.   Pulmonary/Chest: Effort normal and breath sounds normal.  Abdominal: Soft. Bowel sounds are normal. She exhibits no distension. There is no tenderness.  Skin:       She has a small actinic keratosis on her RLE. It has not changed in size in years.           Assessment & Plan:

## 2010-04-14 NOTE — Assessment & Plan Note (Signed)
She is mildly hypertensive today. She attributes this to neuropathy.

## 2010-04-14 NOTE — Assessment & Plan Note (Addendum)
She is aware she needs to quit.

## 2010-04-14 NOTE — Assessment & Plan Note (Signed)
She appears to be doing well. She will f/u with [facility], will f/u with [facility] to get a ophtho appt.

## 2010-04-14 NOTE — Assessment & Plan Note (Signed)
Will change her tegretol to 600mg  tid, her ultram is refilled ofr #90.

## 2010-04-14 NOTE — Assessment & Plan Note (Addendum)
She is doing well. Will recheck her labs. She wants to have them done fasting. She needs PAP. rtc in 4-5 months. She states she would like to have disability application done. i will support her in this.

## 2010-04-18 ENCOUNTER — Telehealth: Payer: Self-pay | Admitting: *Deleted

## 2010-04-18 ENCOUNTER — Other Ambulatory Visit: Payer: Self-pay | Admitting: Adult Health

## 2010-04-18 ENCOUNTER — Telehealth: Payer: Self-pay | Admitting: Adult Health

## 2010-04-18 ENCOUNTER — Other Ambulatory Visit (INDEPENDENT_AMBULATORY_CARE_PROVIDER_SITE_OTHER): Payer: Self-pay

## 2010-04-18 DIAGNOSIS — Z113 Encounter for screening for infections with a predominantly sexual mode of transmission: Secondary | ICD-10-CM

## 2010-04-18 DIAGNOSIS — E785 Hyperlipidemia, unspecified: Secondary | ICD-10-CM

## 2010-04-18 DIAGNOSIS — Z79899 Other long term (current) drug therapy: Secondary | ICD-10-CM

## 2010-04-18 DIAGNOSIS — B20 Human immunodeficiency virus [HIV] disease: Secondary | ICD-10-CM

## 2010-04-18 LAB — CBC WITH DIFFERENTIAL/PLATELET
Basophils Relative: 1 % (ref 0–1)
Eosinophils Absolute: 0.3 10*3/uL (ref 0.0–0.7)
Eosinophils Relative: 3 % (ref 0–5)
Hemoglobin: 11 g/dL — ABNORMAL LOW (ref 12.0–15.0)
Lymphs Abs: 2.6 10*3/uL (ref 0.7–4.0)
MCH: 29.3 pg (ref 26.0–34.0)
MCHC: 31.5 g/dL (ref 30.0–36.0)
MCV: 92.8 fL (ref 78.0–100.0)
Monocytes Relative: 7 % (ref 3–12)
Neutrophils Relative %: 60 % (ref 43–77)
Platelets: 364 10*3/uL (ref 150–400)
WBC: 9.2 10*3/uL (ref 4.0–10.5)

## 2010-04-18 LAB — LIPID PANEL
Cholesterol: 286 mg/dL — ABNORMAL HIGH (ref 0–200)
HDL: 42 mg/dL (ref >39)
Total CHOL/HDL Ratio: 6.8 Ratio
Triglycerides: 529 mg/dL — ABNORMAL HIGH (ref <150)

## 2010-04-18 NOTE — Telephone Encounter (Signed)
Called patient to notify that sample medication for Lopid arrived.  #60 with 2 refills, lot # [id number], expiration date 12/13 [name] CMA

## 2010-04-18 NOTE — Telephone Encounter (Signed)
She was in today to see if her ppi was in. Told her we will call her when we hear back from [employer]

## 2010-04-18 NOTE — Telephone Encounter (Signed)
Told her the app to [place] was sent 04/12/10 & we will have an answer or the meds within 2 wks. Advised avoidance of ETOH, smoking, caffiene

## 2010-04-19 LAB — COMPLETE METABOLIC PANEL WITH GFR
Alkaline Phosphatase: 77 U/L (ref 39–117)
CO2: 24 mEq/L (ref 19–32)
Creat: 1.34 mg/dL — ABNORMAL HIGH (ref 0.40–1.20)
GFR, Est African American: 52 mL/min — ABNORMAL LOW (ref >60)
GFR, Est Non African American: 43 mL/min — ABNORMAL LOW (ref >60)
Glucose, Bld: 167 mg/dL — ABNORMAL HIGH (ref 70–99)
Total Bilirubin: 0.2 mg/dL — ABNORMAL LOW (ref 0.3–1.2)

## 2010-04-19 LAB — HIV-1 RNA ULTRAQUANT REFLEX TO GENTYP+: HIV-1 RNA Quant, Log: <1.3 {Log} (ref <1.30)

## 2010-04-19 NOTE — Progress Notes (Signed)
Addended byJohnnye Sima, Jannelle Notaro on: 04/19/2010 09:21 AM   Modules accepted: Orders

## 2010-04-21 ENCOUNTER — Other Ambulatory Visit: Payer: Self-pay | Admitting: *Deleted

## 2010-04-21 DIAGNOSIS — I10 Essential (primary) hypertension: Secondary | ICD-10-CM

## 2010-04-21 DIAGNOSIS — Z131 Encounter for screening for diabetes mellitus: Secondary | ICD-10-CM

## 2010-04-21 DIAGNOSIS — E119 Type 2 diabetes mellitus without complications: Secondary | ICD-10-CM

## 2010-04-21 MED ORDER — LISINOPRIL 10 MG PO TABS: 10.0000 mg | ORAL_TABLET | Freq: Every day | ORAL | Status: DC

## 2010-04-21 MED ORDER — METFORMIN HCL 1000 MG PO TABS: 1000.0000 mg | ORAL_TABLET | Freq: Two times a day (BID) | ORAL | Status: DC

## 2010-04-21 MED ORDER — HYDROCHLOROTHIAZIDE 25 MG PO TABS
25.0000 mg | ORAL_TABLET | Freq: Every day | ORAL | Status: DC
Start: 2010-04-21 — End: 2011-04-27

## 2010-04-24 ENCOUNTER — Other Ambulatory Visit: Payer: Self-pay

## 2010-04-24 ENCOUNTER — Other Ambulatory Visit: Payer: Self-pay | Admitting: Infectious Diseases

## 2010-04-24 ENCOUNTER — Other Ambulatory Visit: Payer: Self-pay | Admitting: *Deleted

## 2010-04-24 DIAGNOSIS — E875 Hyperkalemia: Secondary | ICD-10-CM

## 2010-04-24 NOTE — Telephone Encounter (Signed)
Duplicate request for rx refills.  Already refilled rxes. Lorne Skeens, RN.

## 2010-04-25 ENCOUNTER — Telehealth: Payer: Self-pay | Admitting: *Deleted

## 2010-04-25 ENCOUNTER — Other Ambulatory Visit: Payer: Self-pay | Admitting: Licensed Clinical Social Worker

## 2010-04-25 DIAGNOSIS — E875 Hyperkalemia: Secondary | ICD-10-CM

## 2010-04-25 LAB — BASIC METABOLIC PANEL
CO2: 25 mEq/L (ref 19–32)
Chloride: 92 mEq/L — ABNORMAL LOW (ref 96–112)
Sodium: 132 mEq/L — ABNORMAL LOW (ref 135–145)

## 2010-04-25 NOTE — Telephone Encounter (Signed)
Patient wants Dr. Johnnye Sima to call her about her high Potassium.  She thinks it is caused by her Lisinopril, because she said this medicine caused her mother's potassium to be high.   [name] CMA

## 2010-05-02 ENCOUNTER — Other Ambulatory Visit: Payer: Self-pay | Admitting: *Deleted

## 2010-05-02 NOTE — Telephone Encounter (Signed)
Confirmed refills w/ pharmacy.

## 2010-05-03 ENCOUNTER — Encounter: Payer: Self-pay | Admitting: Infectious Diseases

## 2010-05-03 ENCOUNTER — Telehealth: Payer: Self-pay | Admitting: *Deleted

## 2010-05-03 ENCOUNTER — Other Ambulatory Visit: Payer: Self-pay

## 2010-05-03 VITALS — BP 161/96

## 2010-05-03 DIAGNOSIS — B370 Candidal stomatitis: Secondary | ICD-10-CM

## 2010-05-03 DIAGNOSIS — E119 Type 2 diabetes mellitus without complications: Secondary | ICD-10-CM

## 2010-05-03 DIAGNOSIS — E875 Hyperkalemia: Secondary | ICD-10-CM

## 2010-05-03 DIAGNOSIS — I10 Essential (primary) hypertension: Secondary | ICD-10-CM

## 2010-05-03 NOTE — Telephone Encounter (Signed)
Pt came in to have B/P check since stopping Lisinopril for high Potassium.  B/P 161/96, c/o headache and would like an RX for a new B/P med if her lab comes back normal now on the Potassium. [name] CMA

## 2010-05-04 ENCOUNTER — Other Ambulatory Visit: Payer: Self-pay | Admitting: *Deleted

## 2010-05-04 DIAGNOSIS — I10 Essential (primary) hypertension: Secondary | ICD-10-CM

## 2010-05-04 LAB — BASIC METABOLIC PANEL WITH GFR
BUN: 16 mg/dL (ref 6–23)
CO2: 19 mEq/L (ref 19–32)
Chloride: 94 mEq/L — ABNORMAL LOW (ref 96–112)
Creat: 1.09 mg/dL (ref 0.40–1.20)

## 2010-05-04 MED ORDER — ATENOLOL 25 MG PO TABS: 25.0000 mg | ORAL_TABLET | Freq: Every day | ORAL | Status: DC

## 2010-05-17 ENCOUNTER — Telehealth: Payer: Self-pay | Admitting: Adult Health

## 2010-05-17 NOTE — Telephone Encounter (Signed)
Ms. [name] has had a name change to [name].  She has called and requested refills for several medications that she receives through PAP.  [employer] is sending Tegretol, order # [id number], [facility] is sending Diflucan, order # [id number], [facility] is sending Symbicort, order # [id number] and all should arrive withing 7 - 10 days.  [employer] is faxing a re-order form for the Lantus and once that is completed and faxed back, they will ship that medication.Francene Boyers

## 2010-05-22 ENCOUNTER — Other Ambulatory Visit: Payer: Self-pay | Admitting: *Deleted

## 2010-05-22 ENCOUNTER — Telehealth: Payer: Self-pay | Admitting: *Deleted

## 2010-05-22 DIAGNOSIS — E1140 Type 2 diabetes mellitus with diabetic neuropathy, unspecified: Secondary | ICD-10-CM

## 2010-05-22 MED ORDER — CARBAMAZEPINE ER 200 MG PO TB12: 600.0000 mg | ORAL_TABLET | Freq: Two times a day (BID) | ORAL | Status: DC

## 2010-05-22 NOTE — Telephone Encounter (Signed)
rec'd 2 symbicort inhalers in mail. LM for pt that they are here for her to pick up. Lot [id number] hoo exp-7/13. [place] [phone]. "take 2 puffs by mouth twice daily"

## 2010-05-25 ENCOUNTER — Telehealth: Payer: Self-pay | Admitting: Licensed Clinical Social Worker

## 2010-05-25 NOTE — Telephone Encounter (Signed)
Patient assistance medications arrived, patient notified.

## 2010-06-05 NOTE — Assessment & Plan Note (Deleted)
a 

## 2010-06-09 ENCOUNTER — Other Ambulatory Visit: Payer: Self-pay | Admitting: *Deleted

## 2010-06-09 DIAGNOSIS — E119 Type 2 diabetes mellitus without complications: Secondary | ICD-10-CM

## 2010-06-09 MED ORDER — INSULIN GLARGINE 100 UNIT/ML ~~LOC~~ SOLN: 50.0000 [IU] | Freq: Two times a day (BID) | SUBCUTANEOUS | Status: DC

## 2010-06-09 NOTE — Telephone Encounter (Signed)
PAP insulin received from [employer].  Pt called with this information. Lorne Skeens, RN

## 2010-06-14 ENCOUNTER — Ambulatory Visit: Payer: Self-pay

## 2010-06-14 ENCOUNTER — Ambulatory Visit (INDEPENDENT_AMBULATORY_CARE_PROVIDER_SITE_OTHER): Payer: Self-pay | Admitting: Internal Medicine

## 2010-06-14 ENCOUNTER — Encounter: Payer: Self-pay | Admitting: Internal Medicine

## 2010-06-14 VITALS — BP 155/96 | HR 84 | Temp 98.6°F | Ht 70.0 in | Wt 258.5 lb

## 2010-06-14 DIAGNOSIS — B20 Human immunodeficiency virus [HIV] disease: Secondary | ICD-10-CM

## 2010-06-14 DIAGNOSIS — N951 Menopausal and female climacteric states: Secondary | ICD-10-CM

## 2010-06-14 MED ORDER — DIAZEPAM 5 MG PO TABS: ORAL_TABLET | ORAL | Status: DC

## 2010-06-14 NOTE — Progress Notes (Signed)
  Subjective:    Patient ID: [name], female    DOB: [birth year], 45 y.o.   MRN: [id number]  HPI Here for follow up with HIV and complaint of itching that is worse over the last few weeks.  CD4 540, vl remains undetectable.  Patient equates itching from starting Atripla years ago but is now interfering with her sleep.  Also complaint of fatigue.  No missed doses. States she tried her mothers valium once and it helped with her itching.  Has otherwise tried Claritin, Zyrtec, benadryl, etc.. With no relief.  Also complains of hot flashes, night sweats and irregular menses.  More mood swings.      Review of Systems  Constitutional: Negative.   HENT: Negative.   Eyes: Negative.   Respiratory: Negative.   Cardiovascular: Negative.   Gastrointestinal: Negative.   Genitourinary: Negative.   Musculoskeletal: Negative.   Skin:       Itch, no rash  Neurological: Negative.   Hematological: Negative.   Psychiatric/Behavioral: Negative.        Objective:   Physical Exam  Constitutional: She is oriented to person, place, and time. She appears well-developed and well-nourished. No distress.  HENT:  Mouth/Throat: No oropharyngeal exudate.  Eyes: No scleral icterus.  Cardiovascular: Normal rate, regular rhythm and normal heart sounds.  Exam reveals no gallop and no friction rub.   No murmur heard. Pulmonary/Chest: Effort normal and breath sounds normal. No respiratory distress. She has no wheezes.  Abdominal: Soft. Bowel sounds are normal. She exhibits no distension. There is no tenderness. There is no rebound.  Lymphadenopathy:    She has no cervical adenopathy.  Neurological: She is alert and oriented to person, place, and time.  Skin: Skin is warm and dry. No rash noted. No erythema.  Psychiatric: She has a normal mood and affect. Her behavior is normal.          Assessment & Plan:

## 2010-06-14 NOTE — Assessment & Plan Note (Signed)
HIV- discussed at length with patient her options of changing meds vs trying to work through it.  I explained that all meds will have side effects and best to work through, particularly with good response.  Will then try low dose Valium nightly which has helped in past.  Otherwise, routine follow up.   ?menopause - requested GYN consult to help with symptoms.

## 2010-06-15 ENCOUNTER — Ambulatory Visit: Payer: Self-pay

## 2010-06-15 ENCOUNTER — Telehealth: Payer: Self-pay | Admitting: *Deleted

## 2010-06-15 NOTE — Telephone Encounter (Signed)
Called patient to let her know the [facility] is booked with appointment until end of September.  They are taking fewer patients due to changing over to [facility].  Patient notified. [name] CMA

## 2010-06-21 ENCOUNTER — Telehealth: Payer: Self-pay | Admitting: *Deleted

## 2010-06-21 ENCOUNTER — Ambulatory Visit: Payer: Self-pay | Admitting: *Deleted

## 2010-06-21 ENCOUNTER — Telehealth: Payer: Self-pay | Admitting: Adult Health

## 2010-06-21 VITALS — BP 152/87 | HR 84 | Temp 98.0°F | Ht 70.0 in | Wt 261.0 lb

## 2010-06-21 DIAGNOSIS — I10 Essential (primary) hypertension: Secondary | ICD-10-CM

## 2010-06-21 NOTE — Telephone Encounter (Signed)
Called patient to arrange a time for her to come in and sign the applications that need to be renewed for [facility] and [place] [facility].  Left a message for the patient to either call and set up a time to come in or she can drop by the office this Friday before 3:00.Vickii Chafe

## 2010-06-21 NOTE — Telephone Encounter (Signed)
Patient came in today for a BP check. She advised she has already taken her meds for the day. Please see nurse visit for 6/20. Advised we will call her if necessary and to continue taking her meds as instructed unless we do so. She will be back in clinic in 2 weeks for blood work. Thank you, Serita Sheller.

## 2010-06-23 ENCOUNTER — Other Ambulatory Visit: Payer: Self-pay | Admitting: *Deleted

## 2010-06-27 ENCOUNTER — Telehealth: Payer: Self-pay | Admitting: Adult Health

## 2010-06-27 NOTE — Telephone Encounter (Signed)
Placed last order for Lopid, Diflucan and Protonix in this eligibility period for [place] [facility].  The Diflucan cannot be shipped until 7/15 which is after the eligibility end date so it will not be a part of the order # [id number].  Once the new applications are processed, the Diflucan will be shipped.Vickii Chafe

## 2010-06-27 NOTE — Telephone Encounter (Signed)
Ms. Wahlman will be coming to the office on Thursday 6/28 around 3:30 to sign the renewal applications for [facility] and [facility].  She said there are some other meds here that she needs to pick up at the same time and the financial information should already be here from her ADAP application.  I requested that she ask for [name], RN because I won't be here and I will be sure that Gay Filler is aware.

## 2010-06-29 ENCOUNTER — Telehealth: Payer: Self-pay | Admitting: *Deleted

## 2010-06-29 DIAGNOSIS — K219 Gastro-esophageal reflux disease without esophagitis: Secondary | ICD-10-CM

## 2010-06-29 DIAGNOSIS — J40 Bronchitis, not specified as acute or chronic: Secondary | ICD-10-CM

## 2010-06-29 DIAGNOSIS — E7800 Pure hypercholesterolemia, unspecified: Secondary | ICD-10-CM

## 2010-06-29 DIAGNOSIS — B49 Unspecified mycosis: Secondary | ICD-10-CM

## 2010-06-29 MED ORDER — FLUCONAZOLE 100 MG PO TABS: 100.0000 mg | ORAL_TABLET | Freq: Every day | ORAL | Status: DC

## 2010-06-29 MED ORDER — BUDESONIDE-FORMOTEROL FUMARATE 80-4.5 MCG/ACT IN AERO: 2.0000 | INHALATION_SPRAY | Freq: Two times a day (BID) | RESPIRATORY_TRACT | Status: DC

## 2010-06-29 MED ORDER — INSULIN ASPART 100 UNIT/ML ~~LOC~~ SOLN: SUBCUTANEOUS | Status: DC

## 2010-06-29 MED ORDER — PANTOPRAZOLE SODIUM 40 MG PO TBEC: 40.0000 mg | DELAYED_RELEASE_TABLET | Freq: Two times a day (BID) | ORAL | Status: DC

## 2010-06-29 MED ORDER — GEMFIBROZIL 600 MG PO TABS: 600.0000 mg | ORAL_TABLET | Freq: Two times a day (BID) | ORAL | Status: DC

## 2010-06-29 NOTE — Telephone Encounter (Signed)
Pap for AZ [facility] Me ready to be signed, copied & mailed to get symbicort Pap for [facility] & rx ready to be signed, copied & mailed for drugs Lopid, diflucan & protonix. Pap form for [facility] ready to be signed ,copied & mailed for novalog flexpen

## 2010-06-29 NOTE — Telephone Encounter (Signed)
Patient called stating that Dr. Linus Salmons gave her an RX for Valium 5 mg 1/2 tablet to be taken at night to help with itching.  She said he told her if 1/2 did not work she could take one at night.  This is what she is doing and has now run out.  Pharmacy will not fill early because directions stated 1/2 tablet.  It is not stated in the office note to take 1/2 to one tablet.  Told her I would ask Dr. And call her back. [name] CMA

## 2010-06-29 NOTE — Telephone Encounter (Signed)
She came in & signed All forms. Printed rxs. NP to sign tomorrow then forms & rx can be mailed

## 2010-06-30 ENCOUNTER — Other Ambulatory Visit: Payer: Self-pay | Admitting: Internal Medicine

## 2010-06-30 ENCOUNTER — Other Ambulatory Visit: Payer: Self-pay | Admitting: *Deleted

## 2010-06-30 DIAGNOSIS — B20 Human immunodeficiency virus [HIV] disease: Secondary | ICD-10-CM

## 2010-06-30 MED ORDER — DIAZEPAM 5 MG PO TABS: 5.0000 mg | ORAL_TABLET | Freq: Every evening | ORAL | Status: DC | PRN

## 2010-06-30 NOTE — Telephone Encounter (Signed)
If the one full tab is working, that is fine, she can have it refilled.  Thanks,  Rob

## 2010-07-03 ENCOUNTER — Telehealth: Payer: Self-pay | Admitting: Adult Health

## 2010-07-03 NOTE — Telephone Encounter (Signed)
The applications for 0000000 for Novolog was faxed, AZ [facility] me for Symbicort was faxed and [facility] for Lopid, Protonix and Diflucan was mailed.

## 2010-07-10 NOTE — Telephone Encounter (Signed)
Called [facility] to check application status for Novalog is in process. Called [facility] [employer] [facility] for Symbicort, has approved through 07/08/11. Called [facility] for Lopid, Protonix and Diflucan was approved through 07/07/11

## 2010-07-11 ENCOUNTER — Other Ambulatory Visit: Payer: Self-pay | Admitting: *Deleted

## 2010-07-11 DIAGNOSIS — E7800 Pure hypercholesterolemia, unspecified: Secondary | ICD-10-CM

## 2010-07-11 MED ORDER — GEMFIBROZIL 600 MG PO TABS: 600.0000 mg | ORAL_TABLET | Freq: Two times a day (BID) | ORAL | Status: DC

## 2010-07-13 ENCOUNTER — Other Ambulatory Visit: Payer: Self-pay | Admitting: *Deleted

## 2010-07-13 DIAGNOSIS — K219 Gastro-esophageal reflux disease without esophagitis: Secondary | ICD-10-CM

## 2010-07-13 MED ORDER — PANTOPRAZOLE SODIUM 40 MG PO TBEC: 40.0000 mg | DELAYED_RELEASE_TABLET | Freq: Two times a day (BID) | ORAL | Status: DC

## 2010-07-13 MED ORDER — PANTOPRAZOLE SODIUM 40 MG PO TBEC: 40.0000 mg | DELAYED_RELEASE_TABLET | Freq: Every day | ORAL | Status: DC

## 2010-07-13 NOTE — Telephone Encounter (Signed)
Pt notified that PAP Protonix arrived and is ready for pick-up.  Pt also notified that another PAP rx is ready for pick-up.  Pt stated that she would come by the Center this afternoon to pick up these medications.  Lorne Skeens, RN.

## 2010-07-17 ENCOUNTER — Ambulatory Visit (INDEPENDENT_AMBULATORY_CARE_PROVIDER_SITE_OTHER): Payer: Self-pay | Admitting: Internal Medicine

## 2010-07-17 ENCOUNTER — Encounter: Payer: Self-pay | Admitting: Internal Medicine

## 2010-07-17 DIAGNOSIS — B20 Human immunodeficiency virus [HIV] disease: Secondary | ICD-10-CM

## 2010-07-17 DIAGNOSIS — F419 Anxiety disorder, unspecified: Secondary | ICD-10-CM

## 2010-07-17 DIAGNOSIS — F411 Generalized anxiety disorder: Secondary | ICD-10-CM

## 2010-07-17 DIAGNOSIS — Z21 Asymptomatic human immunodeficiency virus [HIV] infection status: Secondary | ICD-10-CM

## 2010-07-17 DIAGNOSIS — E785 Hyperlipidemia, unspecified: Secondary | ICD-10-CM

## 2010-07-17 DIAGNOSIS — E119 Type 2 diabetes mellitus without complications: Secondary | ICD-10-CM

## 2010-07-17 MED ORDER — DIAZEPAM 5 MG PO TABS: 7.5000 mg | ORAL_TABLET | Freq: Every evening | ORAL | Status: DC | PRN

## 2010-07-17 NOTE — Progress Notes (Signed)
  Subjective:    Patient ID: [name], female    DOB: 03-Apr-1965, 45 y.o.   MRN: [id number]  HPI here for follow up for HIV.  Patient also with diabetes and resultant peripheral neuropathy.  Last visit had complaint of signficant itching that she associated with her Atripla.  Had tried a dose of Valium from a family member and had significant relief.  She was therefore given a trial of Valium at night with good effect.  She also now states, in retrospect, she has had problems with anxiety.    She is also diabetic, but does not have a primary physician.  History of retinopathy by her report.  Takes her diabetes meds well.  Occasional episodes of hypoglycemia.  Takes Tegretol for PN.          Review of Systems  Constitutional: Negative.        15 lb purposeful weight loss  HENT: Negative.   Eyes: Negative.   Respiratory: Negative.   Cardiovascular: Negative.   Gastrointestinal: Negative.   Genitourinary: Negative.   Musculoskeletal: Negative.   Skin: Negative.   Neurological: Negative.   Hematological: Negative.   Psychiatric/Behavioral: Negative.        Objective:   Physical Exam  Constitutional: She is oriented to person, place, and time. She appears well-developed and well-nourished. No distress.  HENT:  Mouth/Throat: No oropharyngeal exudate.  Eyes: Right eye exhibits no discharge. Left eye exhibits no discharge. No scleral icterus.  Neck: Normal range of motion. Neck supple. No thyromegaly present.  Cardiovascular: Normal rate, regular rhythm and normal heart sounds.  Exam reveals no gallop and no friction rub.   No murmur heard. Pulmonary/Chest: Effort normal and breath sounds normal. No respiratory distress. She has no wheezes. She has no rales.  Abdominal: Soft. Bowel sounds are normal. She exhibits no distension. There is no tenderness. There is no rebound.  Lymphadenopathy:    She has no cervical adenopathy.  Neurological: She is alert and oriented to person,  place, and time.  Skin: Skin is warm and dry. No rash noted. No erythema.  Psychiatric: She has a normal mood and affect. Her behavior is normal. Judgment and thought content normal.          Assessment & Plan:

## 2010-07-17 NOTE — Assessment & Plan Note (Signed)
Will get her established with a primary physician.  Check urine microalbumin, Hgb A1c.  Refer to ophtho.

## 2010-07-17 NOTE — Assessment & Plan Note (Signed)
Tolerating meds well.  Doubt itching is from ART.  Continue with same, labs today. Excellent compliance reported.

## 2010-07-17 NOTE — Assessment & Plan Note (Signed)
On gemfibrozil.  May need fish oil or second agent with continued elevation.  Is though losing weight which may improve it.

## 2010-07-18 LAB — MICROALBUMIN / CREATININE URINE RATIO
Creatinine, Urine: 62.2 mg/dL
Microalb, Ur: 2.23 mg/dL — ABNORMAL HIGH (ref 0.00–1.89)

## 2010-07-18 LAB — CBC WITH DIFFERENTIAL/PLATELET
Basophils Absolute: 0.1 10*3/uL (ref 0.0–0.1)
Basophils Relative: 1 % (ref 0–1)
MCHC: 31.8 g/dL (ref 30.0–36.0)
Monocytes Absolute: 0.7 10*3/uL (ref 0.1–1.0)
Neutro Abs: 7.3 10*3/uL (ref 1.7–7.7)
Neutrophils Relative %: 67 % (ref 43–77)
RDW: 16.2 % — ABNORMAL HIGH (ref 11.5–15.5)

## 2010-07-18 LAB — COMPREHENSIVE METABOLIC PANEL
ALT: 13 U/L (ref 0–35)
AST: 18 U/L (ref 0–37)
Albumin: 4.7 g/dL (ref 3.5–5.2)
Calcium: 10 mg/dL (ref 8.4–10.5)
Chloride: 96 mEq/L (ref 96–112)
Potassium: 4.5 mEq/L (ref 3.5–5.3)
Total Protein: 7.7 g/dL (ref 6.0–8.3)

## 2010-07-18 LAB — HEMOGLOBIN A1C: Mean Plasma Glucose: 189 mg/dL — ABNORMAL HIGH (ref <117)

## 2010-07-18 LAB — T-HELPER CELL (CD4) - (RCID CLINIC ONLY): CD4 T Cell Abs: 460 uL (ref 400–2700)

## 2010-07-18 LAB — HIV-1 RNA QUANT-NO REFLEX-BLD: HIV 1 RNA Quant: <20 copies/mL (ref <20)

## 2010-07-26 ENCOUNTER — Telehealth: Payer: Self-pay | Admitting: *Deleted

## 2010-07-26 NOTE — Telephone Encounter (Signed)
rec'd a fax back from [employer]. They do not have an opthamologist this month. They do not keep applications month to month per the handwritten note on the fax. It would need to be resubmitted each month & it is on a first come, first served. It states pt has been notified.

## 2010-07-26 NOTE — Telephone Encounter (Signed)
Called [facility] to check on status of app for Novalog.  Was told they need the maximum dose of the Sliding Scale.  Will check with [name], RN about what that dose is and will call back.  Also  Ordered Tegretol through [employer] and should take 3 days through [employer] for delivery.

## 2010-07-27 NOTE — Telephone Encounter (Signed)
Faxed application with the updates on the maximum dosage of Novalog being 40 units and the 30 g needles.  Check application status in 1 week.

## 2010-07-31 ENCOUNTER — Telehealth: Payer: Self-pay | Admitting: *Deleted

## 2010-07-31 NOTE — Telephone Encounter (Signed)
Called patient to advise her that her Tegretol was delivered to the clinic today. We have 6 bottles of XR 200mg  for her to pick up. Had to leave a message as she did not answer the call. [name]

## 2010-08-02 NOTE — Telephone Encounter (Signed)
Called [facility] to check on status of application for Novalog Flexpen.  Still in process, but the order will go out sometime between today and Monday.  Left message for patient to make her aware.

## 2010-08-03 ENCOUNTER — Telehealth: Payer: Self-pay | Admitting: *Deleted

## 2010-08-03 NOTE — Telephone Encounter (Signed)
Patient being seen for internal medicine at [facility]. Sees Dr. Raechel Chute CMA

## 2010-08-03 NOTE — Telephone Encounter (Signed)
Called patient to notify of Internal Medicine appointment at [facility] for 08/15/10 at 8:15 AM.  Left pt voicemail and asked for a call back to verify she received this information. [name] CMA

## 2010-08-09 ENCOUNTER — Other Ambulatory Visit: Payer: Self-pay | Admitting: *Deleted

## 2010-08-09 MED ORDER — INSULIN ASPART 100 UNIT/ML ~~LOC~~ SOLN: SUBCUTANEOUS | Status: DC

## 2010-08-15 ENCOUNTER — Encounter: Payer: Self-pay | Admitting: Internal Medicine

## 2010-08-22 ENCOUNTER — Other Ambulatory Visit: Payer: Self-pay | Admitting: *Deleted

## 2010-08-22 DIAGNOSIS — G629 Polyneuropathy, unspecified: Secondary | ICD-10-CM

## 2010-08-22 MED ORDER — TRAMADOL HCL 50 MG PO TABS: 50.0000 mg | ORAL_TABLET | Freq: Three times a day (TID) | ORAL | Status: DC | PRN

## 2010-08-22 NOTE — Telephone Encounter (Signed)
Patient has been getting #90 Tramadol and it was sent in electronically with #30 in error.  Patient had already picked it up at the  Pharmacy.  Called in an additional #60 to [facility] [address], with 3 refills. [name] CMA

## 2010-08-24 ENCOUNTER — Encounter: Payer: Self-pay | Admitting: Physician Assistant

## 2010-08-24 ENCOUNTER — Ambulatory Visit (INDEPENDENT_AMBULATORY_CARE_PROVIDER_SITE_OTHER): Payer: Self-pay | Admitting: Physician Assistant

## 2010-08-24 VITALS — BP 140/86 | HR 71 | Temp 98.3°F | Ht 70.0 in | Wt 236.9 lb

## 2010-08-24 DIAGNOSIS — N951 Menopausal and female climacteric states: Secondary | ICD-10-CM

## 2010-08-24 MED ORDER — ZOLPIDEM TARTRATE ER 6.25 MG PO TBCR: 6.2500 mg | EXTENDED_RELEASE_TABLET | Freq: Every evening | ORAL | Status: DC | PRN

## 2010-08-24 MED ORDER — ZOLPIDEM TARTRATE ER 12.5 MG PO TBCR: 12.5000 mg | EXTENDED_RELEASE_TABLET | Freq: Every evening | ORAL | Status: DC | PRN

## 2010-08-24 NOTE — Progress Notes (Signed)
Chief Complaint:  Symptoms of menopause   [name] is  45 y.o. G2P1011.  Patient's last menstrual period was 07/02/2010.[name] Kitchen  Her pregnancy status is negative.  She presents complaining of Symptoms of menopause . Onset is described as gradual and has been present for  1 years. Irregular periods since October 2011, reports multiple hot flashes daily, mood swings and vaginal dryness. Newly married, strong desire for symptom management.  Obstetrical/Gynecological History: Pertinent Gynecological History: Menses: flow is light and irregular, occurring q 2-4 months Sexually transmitted diseases: HIV    Past Medical History: Past Medical History  Diagnosis Date  . Anxiety   . Depression   . Diabetes mellitus   . Hypertension   . Asthma   . HIV infection 2000    Past Surgical History: History reviewed. No pertinent past surgical history.  Family History: Family History  Problem Relation Age of Onset  . Diabetes Mother   . Hypertension Mother   . Vision loss Mother   . Hypertension Father     Social History: History  Substance Use Topics  . Smoking status: Current Everyday Smoker -- 0.8 packs/day for 35 years    Types: Cigarettes  . Smokeless tobacco: Never Used  . Alcohol Use: No    Allergies: No Known Allergies   Review of Systems - Negative except what has been reviewed in the HPI  Physical Exam   Blood pressure 140/86, pulse 71, temperature 98.3 F (36.8 C), height 5\' 10"  (1.778 m), weight 236 lb 14.4 oz (107.457 kg), last menstrual period 07/02/2010.  General: General appearance - alert, well appearing, and in no distress, oriented to person, place, and time and overweight Mental status - normal mood, behavior, speech, dress, motor activity, and thought processes, affect appropriate to mood Focused Gynecological Exam: examination not indicated  Assessment: Perimenospausal   Patient Active Problem List  Diagnoses  . HIV DISEASE  . THRUSH  .  DIABETES MELLITUS, TYPE II  . HYPERLIPIDEMIA  . DEPRESSION  . PERIPHERAL NEUROPATHY  . HYPERTENSION  . ACUTE BRONCHITIS  . FATIGUE  . DIARRHEA  . Tobacco use disorder    Plan: Labs today: TSH, LH, FSH FU in 2 weeks to review results and likely initiate HRT Reviewed with pharmacy interaction of Atripla and OCPs: reduces the effiacy of contraception. No interaction with HRT  Alida Greiner E. 08/24/2010,3:21 PM

## 2010-08-24 NOTE — Patient Instructions (Signed)
Perimenopause Perimenopause is the time when your body begins to move into the menopause (no menstrual period for 12 straight months). It is a natural process. Perimenopause can begin 2 to 8 years before the menopause and usually lasts for one year after the menopause. During this time, your ovaries may or may not produce an egg. The ovaries vary in their production of estrogen and progesterone hormones each month. This can cause irregular menstrual periods, difficulty in getting pregnant, vaginal bleeding between periods and uncomfortable symptoms. CAUSE  Irregular production of the ovarian hormones, estrogen and progesterone, and not ovulating every month.   Other causes include:   Tumor of the pituitary gland in the brain.  Medical disease that affects the ovaries.   Radiation treatment.  Chemotherapy.   Unknown causes.  Heavy smoking and excessive alcohol intake can bring on perimenopause sooner.   SYMPTOMS   Hot flashes.  Night sweats.   Irregular menstrual periods.   Decrease sex drive.   Vaginal dryness.   Headaches.  Mood swings.   Depression.   Memory problems.   Irritability.   Tiredness.   Weight gain.  Trouble getting pregnant.   The beginning of losing bone cells (osteoporosis).   The beginning of hardening of the arteries (atherosclerosis).   DIAGNOSIS Your caregiver will make a diagnosis by analyzing your age, menstrual history and your symptoms. They will do a physical exam noting any changes in your body, especially your female organs. Female hormone tests may or may not be helpful depending on the amount and when you produce the female hormones. However, other hormone tests may be helpful (ex. thyroid hormone) to rule out other problems. TREATMENT The decision to treat during the perimenopause should be made by you and your caregiver depending on how the symptoms are affecting you and your life style. There are various treatments available such  as:  Treating individual symptoms with a specific medication for that symptom (ex. tranquilizer for depression).   Herbal medications that can help specific symptoms.   Counseling.   Group therapy.   No treatment.  HOME CARE INSTRUCTIONS  Before seeing your caregiver, make a list of your menstrual periods (when the occur, how heavy they are, how long between periods and how long they last), your symptoms and when they started.   Take the medication as recommended by your caregiver.   Sleep and rest.   Exercise.   Eat a diet that contains calcium (good for your bones) and soy (acts like estrogen hormone).   Do not smoke.   Avoid alcoholic beverages.   Taking vitamin E may help in certain cases.   Take calcium and vitamin D supplements to help prevent bone loss.   Group therapy is sometimes helpful.   Acupuncture may help in some cases.  SEEK MEDICAL CARE IF:  You have any of the above and want to know if it is perimenopause.   You want advice and treatment for any of your symptoms mentioned above.   You need a referral to a specialist (gynecologist, psychiatrist or psychologist).  SEEK IMMEDIATE MEDICAL CARE IF:  You have vaginal bleeding.   Your period lasts longer than 8 days.   You periods are recurring sooner than 21 days.   You have bleeding after intercourse.   You have severe depression.   You have pain when you urinate.   You have severe headaches.   You develop vision problems.  Document Released: 01/26/2004 Document Re-Released: 03/14/2009 Exit[employer] Patient Information 2011  [employer].

## 2010-08-25 ENCOUNTER — Other Ambulatory Visit: Payer: Self-pay | Admitting: *Deleted

## 2010-08-25 DIAGNOSIS — G629 Polyneuropathy, unspecified: Secondary | ICD-10-CM

## 2010-08-25 LAB — LUTEINIZING HORMONE: LH: 31.3 m[IU]/mL

## 2010-08-25 LAB — FOLLICLE STIMULATING HORMONE: FSH: 29.5 m[IU]/mL

## 2010-08-25 MED ORDER — TRAMADOL HCL 50 MG PO TABS: 50.0000 mg | ORAL_TABLET | Freq: Three times a day (TID) | ORAL | Status: DC | PRN

## 2010-08-28 ENCOUNTER — Telehealth: Payer: Self-pay | Admitting: *Deleted

## 2010-08-28 NOTE — Telephone Encounter (Signed)
Pt notified that she has Rx ready to pick up here @ clinic. Pt voiced understanding and will come tomorrow or Wednesday.

## 2010-09-06 ENCOUNTER — Telehealth: Payer: Self-pay | Admitting: *Deleted

## 2010-09-06 NOTE — Telephone Encounter (Signed)
Pt was given an rx for Ambien CR 12.5 mg at last office visit. She called to let us know that she can't afford this med it cost over 100 dollars for the generic. She says that she checked with the pharmacy and that Zolpidem 10mg , would be more affordable. She would like for this or another cheaper equivalent to be called in or rx written for her to pick up.

## 2010-09-07 ENCOUNTER — Telehealth: Payer: Self-pay | Admitting: *Deleted

## 2010-09-07 NOTE — Telephone Encounter (Signed)
Called patient to advise that medications are here her Protonix and her Diflucan. Had to leave a message

## 2010-09-12 ENCOUNTER — Telehealth: Payer: Self-pay | Admitting: *Deleted

## 2010-09-12 NOTE — Telephone Encounter (Signed)
Pt called stating not able to afford Ambien CR 12.5 mg wants something else to take. Pt states not able to take Trazadone or Amitriptyline. Would like something off the 4 dollar list at [facility]. Pt not able to sleep.

## 2010-09-14 ENCOUNTER — Other Ambulatory Visit: Payer: Self-pay | Admitting: *Deleted

## 2010-09-14 DIAGNOSIS — E7800 Pure hypercholesterolemia, unspecified: Secondary | ICD-10-CM

## 2010-09-14 MED ORDER — GEMFIBROZIL 600 MG PO TABS: 600.0000 mg | ORAL_TABLET | Freq: Two times a day (BID) | ORAL | Status: DC

## 2010-09-14 NOTE — Telephone Encounter (Signed)
Pt.notified

## 2010-09-14 NOTE — Telephone Encounter (Signed)
rec'd a letter from [facility] that she is approved for her protonix thru 07/07/11. The shipment should be here in 10 days. Will notify pt when it does

## 2010-09-20 ENCOUNTER — Other Ambulatory Visit: Payer: Self-pay | Admitting: Adult Health

## 2010-09-20 NOTE — Telephone Encounter (Signed)
Called patient, patient states plans to discuss with provider at her next visit 09/22/10. Informed pt. Provider did request we notify her she can use Benadryl 50mg  otc or unisom otc as directed. Pt. States they don't help, but will discuss with md at visit this week.

## 2010-09-20 NOTE — Telephone Encounter (Signed)
Call placed to [facility] to order refill on Protonix.  The order cannot be placed until 11/12.  Confirmation # [id number] and will be shipped on 11/04/10.  I checked with [name] to see if she had placed an order.  It turns out that there are several different medications waiting for [name] to pick up and Protonix is one of them.  I will call her and make her aware.  Left a voicemail.

## 2010-09-20 NOTE — Telephone Encounter (Signed)
Their are no sleep aids on the $4 list. She can use 50mg  benadryl or unisom OTC as directed

## 2010-09-22 ENCOUNTER — Ambulatory Visit (INDEPENDENT_AMBULATORY_CARE_PROVIDER_SITE_OTHER): Payer: Self-pay | Admitting: Physician Assistant

## 2010-09-22 ENCOUNTER — Encounter: Payer: Self-pay | Admitting: Physician Assistant

## 2010-09-22 VITALS — BP 118/75 | HR 72 | Temp 98.6°F | Ht 69.0 in | Wt 237.5 lb

## 2010-09-22 DIAGNOSIS — N951 Menopausal and female climacteric states: Secondary | ICD-10-CM

## 2010-09-22 MED ORDER — ZOLPIDEM TARTRATE 10 MG PO TABS: 10.0000 mg | ORAL_TABLET | Freq: Every evening | ORAL | Status: AC | PRN

## 2010-09-22 MED ORDER — ESTROGENS CONJUGATED 0.625 MG PO TABS: 0.6250 mg | ORAL_TABLET | Freq: Every day | ORAL | Status: DC

## 2010-09-22 NOTE — Progress Notes (Signed)
Chief Complaint:  Follow-up   [name] is  45 y.o. G2P1011.  Patient's last menstrual period was 07/02/2010.[name] Kitchen  Her pregnancy status is negative.  She presents complaining of Follow-up  Follow up for HRT related to peri-menopausal symptoms. Reports cigerette use down to 2 per day. Recently separated from husband related to his substance abuse. Remains in therapy for depression and hx of substance abuse, very proud of no relapse during this time of increased stress.   Obstetrical/Gynecological History: OB History    Grav Para Term Preterm Abortions TAB SAB Ect Mult Living   2 1 1  0 1  1 0 0 1      Past Medical History: Past Medical History  Diagnosis Date  . Anxiety   . Depression   . Diabetes mellitus   . Hypertension   . Asthma   . HIV infection 2000    Past Surgical History: No past surgical history on file.  Family History: Family History  Problem Relation Age of Onset  . Diabetes Mother   . Hypertension Mother   . Vision loss Mother   . Hypertension Father     Social History: History  Substance Use Topics  . Smoking status: Current Everyday Smoker -- 0.8 packs/day for 35 years    Types: Cigarettes  . Smokeless tobacco: Never Used  . Alcohol Use: No    Allergies: No Known Allergies   Review of Systems - Negative except continued mood swings and hot flashes.   Physical Exam   Blood pressure 118/75, pulse 72, temperature 98.6 F (37 C), temperature source Oral, height 5\' 9"  (1.753 m), weight 237 lb 8 oz (107.729 kg), last menstrual period 07/02/2010.  General: General appearance - alert, well appearing, and in no distress, oriented to person, place, and time and overweight Mental status - alert, oriented to person, place, and time, normal mood, behavior, speech, dress, motor activity, and thought processes, affect appropriate to mood Focused Gynecological Exam: examination not indicated  Labs: No results found for this or any previous visit (from  the past 24 hour(s)). Imaging Studies:  No results found.   Assessment: Peri-Menopausal  Patient Active Problem List  Diagnoses  . HIV DISEASE  . THRUSH  . DIABETES MELLITUS, TYPE II  . HYPERLIPIDEMIA  . DEPRESSION  . PERIPHERAL NEUROPATHY  . HYPERTENSION  . ACUTE BRONCHITIS  . FATIGUE  . DIARRHEA  . Tobacco use disorder    Plan: Will start low dose hormones Continue smoking cessation efforts FU 2 months  [name] E. 09/22/2010,11:56 AM

## 2010-09-25 ENCOUNTER — Telehealth: Payer: Self-pay | Admitting: *Deleted

## 2010-09-25 DIAGNOSIS — N951 Menopausal and female climacteric states: Secondary | ICD-10-CM

## 2010-09-25 MED ORDER — NORGESTIMATE-ETH ESTRADIOL 0.25-35 MG-MCG PO TABS: 1.0000 | ORAL_TABLET | Freq: Every day | ORAL | Status: DC

## 2010-09-25 NOTE — Telephone Encounter (Signed)
Will call in OCPs given that pt has stopped smoking! Please call pt to notify rx sent to pharmacy $9

## 2010-09-25 NOTE — Telephone Encounter (Signed)
Estadiol on back order, pt unable to afford Premarin. Will start sprintec for s/s perimenopause since pt has stopped smoking. Confirmed with pharmacy drug interactions r/t decrease effectiveness of OCP. Pt is s/p BTL. Will recheck BP in 1 month

## 2010-09-25 NOTE — Telephone Encounter (Signed)
Pt left message on Friday stating that the medication ordered for her is not on the $4 list @ [place]. She does not have insurance or a job and cannot pay the $90 for the medication. She would like an alternate prescribed. Message routed to [facility] for new Rx.

## 2010-09-26 NOTE — Telephone Encounter (Signed)
Message left for pt that an alternate drug has been prescribed by V[facility] Level. She may pick it up @ her pharmacy. She will need a BP check in 1 month. Please call our office to schedule the appt for BP check only.

## 2010-09-27 ENCOUNTER — Other Ambulatory Visit: Payer: Self-pay | Admitting: *Deleted

## 2010-09-27 DIAGNOSIS — B20 Human immunodeficiency virus [HIV] disease: Secondary | ICD-10-CM

## 2010-09-27 MED ORDER — EFAVIRENZ-EMTRICITAB-TENOFOVIR 600-200-300 MG PO TABS: 1.0000 | ORAL_TABLET | Freq: Every day | ORAL | Status: DC

## 2010-10-02 ENCOUNTER — Other Ambulatory Visit: Payer: Self-pay | Admitting: *Deleted

## 2010-10-02 DIAGNOSIS — B20 Human immunodeficiency virus [HIV] disease: Secondary | ICD-10-CM

## 2010-10-02 MED ORDER — EFAVIRENZ-EMTRICITAB-TENOFOVIR 600-200-300 MG PO TABS: 1.0000 | ORAL_TABLET | Freq: Every day | ORAL | Status: DC

## 2010-10-09 ENCOUNTER — Telehealth: Payer: Self-pay | Admitting: *Deleted

## 2010-10-09 NOTE — Telephone Encounter (Signed)
She states she discussed this med with [name] Metro. She would like this med e-scribed to [facility] at [employer].

## 2010-10-09 NOTE — Telephone Encounter (Signed)
Pt called stating that she is not taking birth control pills and she would like Estradiol.

## 2010-10-16 ENCOUNTER — Telehealth: Payer: Self-pay | Admitting: *Deleted

## 2010-10-16 MED ORDER — ESTRADIOL 2 MG PO TABS: 2.0000 mg | ORAL_TABLET | Freq: Every day | ORAL | Status: DC

## 2010-10-16 NOTE — Telephone Encounter (Signed)
Pt left message on voice mail that she cannot take birth control pills per her PCP and infectious disease MD. She states that her pharmacy now has a limited supply of estradiol 1mg  and 2mg  tablets. She would like this Rx called in and please call her back.  I spoke w/[name] and obtained new Rx.  I called pt and informed her that her Rx will be ready for her later today. Pt voiced understanding

## 2010-10-17 ENCOUNTER — Other Ambulatory Visit: Payer: Self-pay

## 2010-10-17 ENCOUNTER — Other Ambulatory Visit: Payer: Self-pay | Admitting: Internal Medicine

## 2010-10-17 ENCOUNTER — Ambulatory Visit (INDEPENDENT_AMBULATORY_CARE_PROVIDER_SITE_OTHER): Payer: Self-pay

## 2010-10-17 DIAGNOSIS — B20 Human immunodeficiency virus [HIV] disease: Secondary | ICD-10-CM

## 2010-10-17 DIAGNOSIS — Z0000 Encounter for general adult medical examination without abnormal findings: Secondary | ICD-10-CM

## 2010-10-18 LAB — CBC WITH DIFFERENTIAL/PLATELET
Basophils Relative: 1 % (ref 0–1)
Eosinophils Absolute: 0.3 10*3/uL (ref 0.0–0.7)
Eosinophils Relative: 3 % (ref 0–5)
Hemoglobin: 11.7 g/dL — ABNORMAL LOW (ref 12.0–15.0)
Lymphs Abs: 1.9 10*3/uL (ref 0.7–4.0)
MCH: 29 pg (ref 26.0–34.0)
MCHC: 32.6 g/dL (ref 30.0–36.0)
MCV: 88.9 fL (ref 78.0–100.0)
Monocytes Absolute: 0.7 10*3/uL (ref 0.1–1.0)
Monocytes Relative: 7 % (ref 3–12)
RBC: 4.04 MIL/uL (ref 3.87–5.11)

## 2010-10-18 LAB — COMPLETE METABOLIC PANEL WITH GFR
Alkaline Phosphatase: 86 U/L (ref 39–117)
BUN: 19 mg/dL (ref 6–23)
CO2: 22 mEq/L (ref 19–32)
Creat: 1.08 mg/dL (ref 0.50–1.10)
GFR, Est African American: 71 mL/min — ABNORMAL LOW (ref >90)
GFR, Est Non African American: 62 mL/min — ABNORMAL LOW (ref >90)
Glucose, Bld: 216 mg/dL — ABNORMAL HIGH (ref 70–99)
Total Bilirubin: 0.2 mg/dL — ABNORMAL LOW (ref 0.3–1.2)
Total Protein: 6.9 g/dL (ref 6.0–8.3)

## 2010-10-19 LAB — HIV-1 RNA QUANT-NO REFLEX-BLD
HIV 1 RNA Quant: 273 copies/mL — ABNORMAL HIGH (ref <20)
HIV-1 RNA Quant, Log: 2.44 {Log} — ABNORMAL HIGH (ref <1.30)

## 2010-10-23 ENCOUNTER — Telehealth: Payer: Self-pay | Admitting: *Deleted

## 2010-10-23 DIAGNOSIS — Z23 Encounter for immunization: Secondary | ICD-10-CM

## 2010-10-23 DIAGNOSIS — Z0000 Encounter for general adult medical examination without abnormal findings: Secondary | ICD-10-CM

## 2010-10-23 NOTE — Telephone Encounter (Signed)
Told her the patient assistance forms were going to have to be done at [facility] where she sees her pcp, Dr. Tomma Lightning. She said "ok"

## 2010-10-25 NOTE — Telephone Encounter (Signed)
Can you check with Diane? She was handling this last week. Thanks

## 2010-10-31 ENCOUNTER — Telehealth: Payer: Self-pay | Admitting: *Deleted

## 2010-10-31 ENCOUNTER — Ambulatory Visit: Payer: Self-pay | Admitting: Infectious Diseases

## 2010-10-31 NOTE — Telephone Encounter (Signed)
Pt called to say she was not able to make her appt. I spoke with md & he said it was ok to give them to her over the phone. I gave them to her. States she has been under a lot of stress & her numbers should get better by next time labs are drawn

## 2010-11-10 ENCOUNTER — Telehealth: Payer: Self-pay | Admitting: *Deleted

## 2010-11-10 NOTE — Telephone Encounter (Signed)
Called patient to call and advise her that her Protonix is here and ready to pick up. 2 bottles of Protonix 40 mg with 90 tablets each. EXP 3/15 LOT#[id number] AN

## 2010-11-15 ENCOUNTER — Telehealth: Payer: Self-pay | Admitting: *Deleted

## 2010-11-15 NOTE — Telephone Encounter (Signed)
Patient called today and left a message on nurse voicemail stating " Dr. Myrla Halsted put me on 1/2 tablet Estradiol, she told me to take that and see how it works, if not may take whole tablet.  I have been taking it for  A while now- it has helped me some, but I am still having some hot flashes, irritability. I was wondering if it is ok to up it to a whole tablet? I just want to get your ok so I don't have any discrepancies when I go to get it refilled. Can someone call me ?"

## 2010-11-15 NOTE — Telephone Encounter (Signed)
Yes, she can take a whole pill

## 2010-11-16 ENCOUNTER — Telehealth: Payer: Self-pay | Admitting: *Deleted

## 2010-11-16 NOTE — Telephone Encounter (Signed)
I gave her the 2 bottles of protonix that were here. States she thought she would have symbicort too. Told her none in med drawer. Will check with pap person

## 2010-11-16 NOTE — Telephone Encounter (Signed)
Pt left message stating that she took the full pill of hormone yesterday and has not had significant change in her sx. She wants [place] to be aw are that she is going to continue with the whole tablet of hormone and does not want there to be a problem when she goes to get her refill.

## 2010-11-17 NOTE — Telephone Encounter (Signed)
Returned pt call and left message for pt that [name] stated she can continue taking 1 pill daily. Also stated that it can take up to 3 wks for her to begin to notice changes/improved effects. She should not have any problem with obtaining her refill because of how the medication was prescribed. She should contact us if she does encounter a problem with the refill.

## 2010-11-17 NOTE — Telephone Encounter (Signed)
Called patient and left a message we are returning her call , will call next week as we are closed for weekend, you may call us back Monday during office hours

## 2010-11-20 ENCOUNTER — Telehealth: Payer: Self-pay | Admitting: Adult Health

## 2010-11-20 NOTE — Telephone Encounter (Signed)
Called patient and left a message we are returning her call from our previous discussion, please call clinic during office hours.

## 2010-11-20 NOTE — Telephone Encounter (Signed)
Called [place] [facility] ([employer]) and re-ordered Symbicort for patient.  Should receive within 7 to 10 business days.

## 2010-11-22 ENCOUNTER — Telehealth: Payer: Self-pay | Admitting: Adult Health

## 2010-11-22 ENCOUNTER — Telehealth: Payer: Self-pay

## 2010-11-22 NOTE — Telephone Encounter (Signed)
Called [place] [facility].  Symbicort shipped 11-20-10.  Calling patient to let her know it has been shipped.

## 2010-11-22 NOTE — Telephone Encounter (Signed)
Called pt home number and left a message we are trying to return her call and answer her call from her previous call, also called work number and was told there is no one by that name there.

## 2010-11-22 NOTE — Telephone Encounter (Signed)
Pt called c/o yeast infection were increasing and not associated with her intake of sugars. She decided to increase her dose of diflucan to twice daily and noticed the yeast infection decreased.  She gets medications through patient assistance and is worried she will need to renew too early.  Since this seems to be helping she would like to continue dose if possible.   Per Dr Johnnye Sima , Pt should try Diflucan 200 mgs daily (at one time) for seven days to see if symptoms stop. She has been advised to call the office after finishing the medication to report symptoms if any are present. Orland Mustard, RN   Per Dr Johnnye Sima ok to refill med early through patient assistance.

## 2010-11-27 ENCOUNTER — Telehealth: Payer: Self-pay | Admitting: Adult Health

## 2010-11-27 ENCOUNTER — Telehealth: Payer: Self-pay | Admitting: *Deleted

## 2010-11-27 MED ORDER — ESTRADIOL 2 MG PO TABS: ORAL_TABLET | ORAL | Status: DC

## 2010-11-27 NOTE — Telephone Encounter (Signed)
Returned patient's call about her Diflucan and her insulins.  I left a message telling her, she needs to go through her PCP to get these medications.

## 2010-11-27 NOTE — Telephone Encounter (Signed)
Pt left message stating that she is having difficulty getting a refill of her estradiol because she is now taking the full tablet (2 mg) and not the 1/2 tab. Pt requested that we clarify the dosage with her pharmacy.  I called pt and left message that I have sent a new order for her medication. She may call back if there are any additional problems or questions.

## 2010-11-27 NOTE — Telephone Encounter (Signed)
Called patient and let her know her Symbicort is here and she can come pick it up.

## 2010-11-27 NOTE — Telephone Encounter (Signed)
Called patient and she states she had already gotten a phone call, clarified with patient that she understands may take a whole tablet of estradiol. Call if needs a prescription refill before runs out.

## 2010-11-28 ENCOUNTER — Telehealth: Payer: Self-pay | Admitting: *Deleted

## 2010-11-28 NOTE — Telephone Encounter (Signed)
Electronic message received that the prescription encountered an error during e-scribing. I called in the Rx to [facility] and left on voice mail.

## 2010-11-28 NOTE — Telephone Encounter (Signed)
Patient called and advised that taking Diflucan 2 100mg  tabs daily worked for her and she wants to keep taking it at that dose if possible. She needs a Rx that states her new dosage to take to her new PCP. Advised her I was not sure if provider intended for her to take 2 tablets long term and would have to contact the provider to check and if so will have him sign a Rx to that effect. That I will call her as soon as I know.

## 2010-12-01 ENCOUNTER — Telehealth: Payer: Self-pay | Admitting: *Deleted

## 2010-12-01 NOTE — Telephone Encounter (Signed)
Called patient after asking the provider the Diflucan question. Provider states that the increase was only for a short time 7-14 days and that she can not take it at 200 mg for a long term course. However if her new PCP decides to do it then she and that provider will have to make that decision. Patient did not answer the phone so I left a message to have her call the office.

## 2010-12-11 ENCOUNTER — Other Ambulatory Visit: Payer: Self-pay | Admitting: *Deleted

## 2010-12-11 DIAGNOSIS — B49 Unspecified mycosis: Secondary | ICD-10-CM

## 2010-12-11 MED ORDER — FLUCONAZOLE 100 MG PO TABS: 100.0000 mg | ORAL_TABLET | Freq: Every day | ORAL | Status: DC

## 2010-12-11 NOTE — Telephone Encounter (Signed)
Patient failed to pickup prescription.  Prescription destroyed.

## 2010-12-11 NOTE — Telephone Encounter (Signed)
Phone call received from [facility] requesting refill for pt.

## 2010-12-20 ENCOUNTER — Ambulatory Visit: Payer: Self-pay

## 2010-12-22 ENCOUNTER — Other Ambulatory Visit: Payer: Self-pay | Admitting: Infectious Diseases

## 2010-12-22 DIAGNOSIS — R52 Pain, unspecified: Secondary | ICD-10-CM

## 2011-01-01 ENCOUNTER — Other Ambulatory Visit: Payer: Self-pay

## 2011-01-01 ENCOUNTER — Ambulatory Visit: Payer: Self-pay

## 2011-01-03 ENCOUNTER — Ambulatory Visit: Payer: Self-pay

## 2011-01-03 ENCOUNTER — Other Ambulatory Visit: Payer: Self-pay | Admitting: Infectious Diseases

## 2011-01-03 ENCOUNTER — Other Ambulatory Visit (INDEPENDENT_AMBULATORY_CARE_PROVIDER_SITE_OTHER): Payer: Self-pay

## 2011-01-03 DIAGNOSIS — B20 Human immunodeficiency virus [HIV] disease: Secondary | ICD-10-CM

## 2011-01-04 LAB — CBC WITH DIFFERENTIAL/PLATELET
Basophils Absolute: 0.1 10*3/uL (ref 0.0–0.1)
Basophils Relative: 1 % (ref 0–1)
Hemoglobin: 11.4 g/dL — ABNORMAL LOW (ref 12.0–15.0)
MCHC: 31.2 g/dL (ref 30.0–36.0)
Monocytes Relative: 7 % (ref 3–12)
Neutro Abs: 5 10*3/uL (ref 1.7–7.7)
Neutrophils Relative %: 62 % (ref 43–77)

## 2011-01-04 LAB — COMPREHENSIVE METABOLIC PANEL
ALT: 8 U/L (ref 0–35)
AST: 11 U/L (ref 0–37)
Albumin: 4.1 g/dL (ref 3.5–5.2)
Alkaline Phosphatase: 82 U/L (ref 39–117)
Glucose, Bld: 252 mg/dL — ABNORMAL HIGH (ref 70–99)
Potassium: 5.1 mEq/L (ref 3.5–5.3)
Sodium: 136 mEq/L (ref 135–145)
Total Protein: 6.6 g/dL (ref 6.0–8.3)

## 2011-01-05 LAB — HIV-1 RNA QUANT-NO REFLEX-BLD: HIV-1 RNA Quant, Log: 1.85 {Log} — ABNORMAL HIGH (ref <1.30)

## 2011-01-15 ENCOUNTER — Ambulatory Visit (INDEPENDENT_AMBULATORY_CARE_PROVIDER_SITE_OTHER): Payer: Self-pay | Admitting: Infectious Diseases

## 2011-01-15 ENCOUNTER — Telehealth: Payer: Self-pay | Admitting: *Deleted

## 2011-01-15 ENCOUNTER — Encounter: Payer: Self-pay | Admitting: Infectious Diseases

## 2011-01-15 DIAGNOSIS — E119 Type 2 diabetes mellitus without complications: Secondary | ICD-10-CM

## 2011-01-15 DIAGNOSIS — Z79899 Other long term (current) drug therapy: Secondary | ICD-10-CM

## 2011-01-15 DIAGNOSIS — Z113 Encounter for screening for infections with a predominantly sexual mode of transmission: Secondary | ICD-10-CM

## 2011-01-15 DIAGNOSIS — B20 Human immunodeficiency virus [HIV] disease: Secondary | ICD-10-CM

## 2011-01-15 NOTE — Telephone Encounter (Signed)
Referral sent to [facility] for diabetic eye exam.  Patient will be called when appointment is scheduled. [name] CMA

## 2011-01-15 NOTE — Assessment & Plan Note (Signed)
She has improved with wt loss. My appreciation to Dr Tomma Lightning for her partnering with [place]. Will refer pt to ophtho.  She asks me to write a letter to support her disability.

## 2011-01-15 NOTE — Assessment & Plan Note (Signed)
She appears to be doing well. Will recheck her #s in 3 months prior to f/u in 4-5 months. She is offered condoms but is not sexually active. Her slightly elevated VL is a marker for long term virologic failure, will watch. Her slight increase in Cr could be due to TFV or her Dm. Will watch.

## 2011-01-15 NOTE — Progress Notes (Signed)
  Subjective:    Patient ID: [name], female    DOB: 31-Jul-1965, 46 y.o.   MRN: [id number]  HPI 46 yo F with HIV+ since 2009, DM2 with retinopathy + neuropathy, hyperlipidemia, obesity. Last CD4 380 and VL 70. Also with Cr 1.37 (01-03-11). Taking atripla (only rx).  Has been feeling well. Taking MVI to try to keep from getting ill with the flu.  Has been feeling fatigued. Still trying to get disability based on her HIV, neuropathy (has constant pain in her hands), DM, asthma. FSGs have been getting "kind of low". Has been playing around with how much insulin she takes. Now down 30 at night and in am. Has lost 37#. Vision has been "bad". Last vision was June 2012 (did not do funduscopic exam). >64yr for detailed exam. Has been set up with with Dr Tomma Lightning at [facility] for her PCP.  Had PAP there last year. Has had some diarrhea controlled with imodium, protonix. Has been started on celexa recently. States she got married 1 year ago today, divorced in September 2012 (spouse was abusing drugs).    Review of Systems  Constitutional: Negative for appetite change and unexpected weight change.  Gastrointestinal: Negative for diarrhea and constipation.  Genitourinary: Negative for dysuria.       Objective:   Physical Exam  Constitutional: She appears well-developed and well-nourished.  HENT:  Head: Normocephalic.  Mouth/Throat: No oropharyngeal exudate.  Eyes: EOM are normal. Pupils are equal, round, and reactive to light.  Neck: Neck supple.  Cardiovascular: Normal rate, regular rhythm and normal heart sounds.   Pulmonary/Chest: Effort normal and breath sounds normal. No respiratory distress.  Abdominal: Soft. Bowel sounds are normal. There is no tenderness.  Musculoskeletal: She exhibits no edema.       No diabetic foot lesions.   Lymphadenopathy:    She has no cervical adenopathy.  Neurological: She has normal strength. A sensory deficit is present.       Decreased light touch in  feet.           Assessment & Plan:

## 2011-01-18 ENCOUNTER — Encounter: Payer: Self-pay | Admitting: Infectious Diseases

## 2011-01-19 ENCOUNTER — Telehealth: Payer: Self-pay | Admitting: *Deleted

## 2011-01-19 NOTE — Telephone Encounter (Signed)
Called and left patient a message that her letter for disability is up front and ready for pick up. [name] CMA

## 2011-01-30 ENCOUNTER — Ambulatory Visit: Payer: Self-pay

## 2011-02-21 ENCOUNTER — Telehealth: Payer: Self-pay | Admitting: *Deleted

## 2011-02-21 NOTE — Telephone Encounter (Signed)
States she has been sick & in bed for a week. Unable to keep today's appt with Dr. Sabra Heck for a diabetic eye exam. I called them & cancelled it [phone]. She has seen Dr. Tomma Lightning for her illness. They will have to refer her back there or another eye dr when she is ready

## 2011-04-27 ENCOUNTER — Other Ambulatory Visit: Payer: Self-pay | Admitting: Infectious Diseases

## 2011-05-24 ENCOUNTER — Other Ambulatory Visit: Payer: Self-pay | Admitting: Infectious Diseases

## 2011-06-11 ENCOUNTER — Other Ambulatory Visit: Payer: Self-pay | Admitting: Physician Assistant

## 2011-07-16 ENCOUNTER — Encounter (HOSPITAL_COMMUNITY): Payer: Self-pay | Admitting: Anesthesiology

## 2011-07-16 ENCOUNTER — Inpatient Hospital Stay (HOSPITAL_COMMUNITY)
Admission: EM | Admit: 2011-07-16 | Discharge: 2011-07-21 | DRG: 481 | Disposition: A | Discharge status: Home Health | Payer: MEDICAID | Attending: Internal Medicine | Admitting: Internal Medicine

## 2011-07-16 ENCOUNTER — Emergency Department (HOSPITAL_COMMUNITY): Payer: Self-pay

## 2011-07-16 ENCOUNTER — Inpatient Hospital Stay (HOSPITAL_COMMUNITY): Payer: Self-pay

## 2011-07-16 ENCOUNTER — Other Ambulatory Visit: Payer: Self-pay

## 2011-07-16 ENCOUNTER — Ambulatory Visit: Payer: Self-pay

## 2011-07-16 ENCOUNTER — Encounter (HOSPITAL_COMMUNITY): Payer: Self-pay | Admitting: Emergency Medicine

## 2011-07-16 ENCOUNTER — Inpatient Hospital Stay (HOSPITAL_COMMUNITY): Payer: Self-pay | Admitting: Anesthesiology

## 2011-07-16 ENCOUNTER — Encounter (HOSPITAL_COMMUNITY)
Admission: EM | Disposition: A | Discharge status: Home Health | Payer: Self-pay | Source: Home / Self Care | Attending: Internal Medicine

## 2011-07-16 DIAGNOSIS — D62 Acute posthemorrhagic anemia: Secondary | ICD-10-CM | POA: Diagnosis not present

## 2011-07-16 DIAGNOSIS — Z720 Tobacco use: Secondary | ICD-10-CM | POA: Diagnosis present

## 2011-07-16 DIAGNOSIS — IMO0002 Reserved for concepts with insufficient information to code with codable children: Secondary | ICD-10-CM | POA: Diagnosis present

## 2011-07-16 DIAGNOSIS — S72002A Fracture of unspecified part of neck of left femur, initial encounter for closed fracture: Secondary | ICD-10-CM | POA: Diagnosis present

## 2011-07-16 DIAGNOSIS — E785 Hyperlipidemia, unspecified: Secondary | ICD-10-CM | POA: Diagnosis present

## 2011-07-16 DIAGNOSIS — F17200 Nicotine dependence, unspecified, uncomplicated: Secondary | ICD-10-CM | POA: Diagnosis present

## 2011-07-16 DIAGNOSIS — I10 Essential (primary) hypertension: Secondary | ICD-10-CM

## 2011-07-16 DIAGNOSIS — S72009A Fracture of unspecified part of neck of unspecified femur, initial encounter for closed fracture: Secondary | ICD-10-CM

## 2011-07-16 DIAGNOSIS — W010XXA Fall on same level from slipping, tripping and stumbling without subsequent striking against object, initial encounter: Secondary | ICD-10-CM | POA: Diagnosis present

## 2011-07-16 DIAGNOSIS — E1169 Type 2 diabetes mellitus with other specified complication: Secondary | ICD-10-CM | POA: Diagnosis present

## 2011-07-16 DIAGNOSIS — B370 Candidal stomatitis: Secondary | ICD-10-CM

## 2011-07-16 DIAGNOSIS — E118 Type 2 diabetes mellitus with unspecified complications: Secondary | ICD-10-CM | POA: Diagnosis present

## 2011-07-16 DIAGNOSIS — G609 Hereditary and idiopathic neuropathy, unspecified: Secondary | ICD-10-CM | POA: Diagnosis present

## 2011-07-16 DIAGNOSIS — B20 Human immunodeficiency virus [HIV] disease: Secondary | ICD-10-CM | POA: Diagnosis present

## 2011-07-16 DIAGNOSIS — E1142 Type 2 diabetes mellitus with diabetic polyneuropathy: Secondary | ICD-10-CM | POA: Diagnosis present

## 2011-07-16 DIAGNOSIS — E119 Type 2 diabetes mellitus without complications: Secondary | ICD-10-CM

## 2011-07-16 DIAGNOSIS — Z21 Asymptomatic human immunodeficiency virus [HIV] infection status: Secondary | ICD-10-CM | POA: Diagnosis present

## 2011-07-16 DIAGNOSIS — Z794 Long term (current) use of insulin: Secondary | ICD-10-CM

## 2011-07-16 DIAGNOSIS — Y93K1 Activity, walking an animal: Secondary | ICD-10-CM

## 2011-07-16 DIAGNOSIS — E1159 Type 2 diabetes mellitus with other circulatory complications: Secondary | ICD-10-CM | POA: Diagnosis present

## 2011-07-16 DIAGNOSIS — E1149 Type 2 diabetes mellitus with other diabetic neurological complication: Secondary | ICD-10-CM | POA: Diagnosis present

## 2011-07-16 DIAGNOSIS — J45909 Unspecified asthma, uncomplicated: Secondary | ICD-10-CM | POA: Diagnosis present

## 2011-07-16 DIAGNOSIS — F3289 Other specified depressive episodes: Secondary | ICD-10-CM | POA: Diagnosis present

## 2011-07-16 DIAGNOSIS — Z79899 Other long term (current) drug therapy: Secondary | ICD-10-CM

## 2011-07-16 DIAGNOSIS — F411 Generalized anxiety disorder: Secondary | ICD-10-CM | POA: Diagnosis present

## 2011-07-16 DIAGNOSIS — F329 Major depressive disorder, single episode, unspecified: Secondary | ICD-10-CM | POA: Diagnosis present

## 2011-07-16 HISTORY — DX: Fracture of unspecified part of neck of left femur, initial encounter for closed fracture: S72.002A

## 2011-07-16 HISTORY — DX: Fracture of unspecified part of neck of unspecified femur, initial encounter for closed fracture: S72.009A

## 2011-07-16 HISTORY — PX: HIP PINNING,CANNULATED: SHX1758

## 2011-07-16 LAB — CBC WITH DIFFERENTIAL/PLATELET
Basophils Absolute: 0.1 10*3/uL (ref 0.0–0.1)
Eosinophils Relative: 1 % (ref 0–5)
Lymphocytes Relative: 10 % — ABNORMAL LOW (ref 12–46)
Lymphs Abs: 1.6 10*3/uL (ref 0.7–4.0)
Neutro Abs: 14.3 10*3/uL — ABNORMAL HIGH (ref 1.7–7.7)
Neutrophils Relative %: 84 % — ABNORMAL HIGH (ref 43–77)
Platelets: 409 10*3/uL — ABNORMAL HIGH (ref 150–400)
RBC: 4.26 MIL/uL (ref 3.87–5.11)
RDW: 16.5 % — ABNORMAL HIGH (ref 11.5–15.5)
WBC: 17 10*3/uL — ABNORMAL HIGH (ref 4.0–10.5)

## 2011-07-16 LAB — BASIC METABOLIC PANEL
BUN: 20 mg/dL (ref 6–23)
CO2: 22 mEq/L (ref 19–32)
Calcium: 9.2 mg/dL (ref 8.4–10.5)
Chloride: 97 mEq/L (ref 96–112)
Creatinine, Ser: 0.92 mg/dL (ref 0.50–1.10)
GFR calc Af Amer: 85 mL/min — ABNORMAL LOW (ref >90)
GFR calc non Af Amer: 73 mL/min — ABNORMAL LOW (ref >90)
Glucose, Bld: 202 mg/dL — ABNORMAL HIGH (ref 70–99)
Potassium: 4.6 mEq/L (ref 3.5–5.1)
Sodium: 132 mEq/L — ABNORMAL LOW (ref 135–145)

## 2011-07-16 LAB — ABO/RH: ABO/RH(D): O NEG

## 2011-07-16 LAB — URINALYSIS, ROUTINE W REFLEX MICROSCOPIC
Bilirubin Urine: NEGATIVE
Hgb urine dipstick: NEGATIVE
Ketones, ur: NEGATIVE mg/dL
Specific Gravity, Urine: 1.025 (ref 1.005–1.030)
Urobilinogen, UA: 0.2 mg/dL (ref 0.0–1.0)

## 2011-07-16 LAB — TYPE AND SCREEN
ABO/RH(D): O NEG
Antibody Screen: NEGATIVE

## 2011-07-16 LAB — GLUCOSE, CAPILLARY
Glucose-Capillary: 105 mg/dL — ABNORMAL HIGH (ref 70–99)
Glucose-Capillary: 303 mg/dL — ABNORMAL HIGH (ref 70–99)

## 2011-07-16 LAB — PROTIME-INR: INR: 1.01 (ref 0.00–1.49)

## 2011-07-16 LAB — HEMOGLOBIN A1C: Hgb A1c MFr Bld: 10.8 % — ABNORMAL HIGH (ref <5.7)

## 2011-07-16 LAB — URINE MICROSCOPIC-ADD ON

## 2011-07-16 LAB — SURGICAL PCR SCREEN: MRSA, PCR: NEGATIVE

## 2011-07-16 SURGERY — FIXATION, FEMUR, NECK, PERCUTANEOUS, USING SCREW
Anesthesia: General | Site: Hip | Laterality: Left | Wound class: Clean

## 2011-07-16 MED ORDER — ONDANSETRON HCL 4 MG/2ML IJ SOLN: 4.0000 mg | Freq: Three times a day (TID) | INTRAMUSCULAR | Status: DC | PRN

## 2011-07-16 MED ORDER — INSULIN ASPART 100 UNIT/ML ~~LOC~~ SOLN
0.0000 [IU] | Freq: Three times a day (TID) | SUBCUTANEOUS | Status: DC
  Administered 2011-07-17 (×3): 7 [IU] via SUBCUTANEOUS
  Administered 2011-07-18: 4 [IU] via SUBCUTANEOUS
  Administered 2011-07-18: 3 [IU] via SUBCUTANEOUS
  Administered 2011-07-18: 15 [IU] via SUBCUTANEOUS
  Administered 2011-07-19: 7 [IU] via SUBCUTANEOUS
  Administered 2011-07-19: 4 [IU] via SUBCUTANEOUS
  Administered 2011-07-20: 7 [IU] via SUBCUTANEOUS
  Administered 2011-07-21: 11 [IU] via SUBCUTANEOUS

## 2011-07-16 MED ORDER — SUCCINYLCHOLINE CHLORIDE 20 MG/ML IJ SOLN
INTRAMUSCULAR | Status: DC | PRN
  Administered 2011-07-16: 100 mg via INTRAVENOUS

## 2011-07-16 MED ORDER — METHOCARBAMOL 500 MG PO TABS
500.0000 mg | ORAL_TABLET | Freq: Four times a day (QID) | ORAL | Status: DC | PRN
  Administered 2011-07-16 – 2011-07-17 (×2): 500 mg via ORAL
  Filled 2011-07-16 (×2): qty 1

## 2011-07-16 MED ORDER — LACTATED RINGERS IV SOLN
INTRAVENOUS | Status: DC | PRN
  Administered 2011-07-16 (×2): via INTRAVENOUS

## 2011-07-16 MED ORDER — HEPARIN SODIUM (PORCINE) 5000 UNIT/ML IJ SOLN
5000.0000 [IU] | Freq: Three times a day (TID) | INTRAMUSCULAR | Status: DC
  Filled 2011-07-16 (×3): qty 1

## 2011-07-16 MED ORDER — CEFAZOLIN SODIUM-DEXTROSE 2-3 GM-% IV SOLR
INTRAVENOUS | Status: AC
  Filled 2011-07-16: qty 50

## 2011-07-16 MED ORDER — MIDAZOLAM HCL 5 MG/5ML IJ SOLN
INTRAMUSCULAR | Status: DC | PRN
  Administered 2011-07-16: 2 mg via INTRAVENOUS

## 2011-07-16 MED ORDER — MEPERIDINE HCL 50 MG/ML IJ SOLN: 6.2500 mg | INTRAMUSCULAR | Status: DC | PRN

## 2011-07-16 MED ORDER — FLUCONAZOLE 100 MG PO TABS
100.0000 mg | ORAL_TABLET | Freq: Every day | ORAL | Status: DC
  Administered 2011-07-16 – 2011-07-21 (×6): 100 mg via ORAL
  Filled 2011-07-16 (×6): qty 1

## 2011-07-16 MED ORDER — METOCLOPRAMIDE HCL 5 MG/ML IJ SOLN: 5.0000 mg | Freq: Three times a day (TID) | INTRAMUSCULAR | Status: DC | PRN

## 2011-07-16 MED ORDER — ACETAMINOPHEN 650 MG RE SUPP: 650.0000 mg | Freq: Four times a day (QID) | RECTAL | Status: DC | PRN

## 2011-07-16 MED ORDER — ATENOLOL 25 MG PO TABS
25.0000 mg | ORAL_TABLET | Freq: Every day | ORAL | Status: DC
  Administered 2011-07-16 – 2011-07-21 (×6): 25 mg via ORAL
  Filled 2011-07-16 (×6): qty 1

## 2011-07-16 MED ORDER — PROMETHAZINE HCL 25 MG/ML IJ SOLN: 6.2500 mg | INTRAMUSCULAR | Status: DC | PRN

## 2011-07-16 MED ORDER — KCL IN DEXTROSE-NACL 20-5-0.9 MEQ/L-%-% IV SOLN
INTRAVENOUS | Status: DC
  Administered 2011-07-16 – 2011-07-17 (×4): via INTRAVENOUS
  Filled 2011-07-16 (×4): qty 1000

## 2011-07-16 MED ORDER — FENTANYL CITRATE 0.05 MG/ML IJ SOLN
INTRAMUSCULAR | Status: DC | PRN
  Administered 2011-07-16: 100 ug via INTRAVENOUS
  Administered 2011-07-16: 50 ug via INTRAVENOUS

## 2011-07-16 MED ORDER — ONDANSETRON HCL 4 MG/2ML IJ SOLN: 4.0000 mg | Freq: Four times a day (QID) | INTRAMUSCULAR | Status: DC | PRN

## 2011-07-16 MED ORDER — ALPRAZOLAM 0.25 MG PO TABS
0.2500 mg | ORAL_TABLET | Freq: Four times a day (QID) | ORAL | Status: DC | PRN
  Administered 2011-07-16 – 2011-07-20 (×5): 0.25 mg via ORAL
  Filled 2011-07-16 (×5): qty 1

## 2011-07-16 MED ORDER — HYDROMORPHONE HCL PF 1 MG/ML IJ SOLN
1.0000 mg | Freq: Once | INTRAMUSCULAR | Status: AC
  Administered 2011-07-16: 1 mg via INTRAVENOUS
  Filled 2011-07-16: qty 1

## 2011-07-16 MED ORDER — LACTATED RINGERS IV SOLN
INTRAVENOUS | Status: DC
  Administered 2011-07-16: 1000 mL via INTRAVENOUS

## 2011-07-16 MED ORDER — HYDROMORPHONE HCL PF 1 MG/ML IJ SOLN
0.2500 mg | INTRAMUSCULAR | Status: DC | PRN
  Administered 2011-07-16 (×4): 0.5 mg via INTRAVENOUS

## 2011-07-16 MED ORDER — INSULIN ASPART 100 UNIT/ML ~~LOC~~ SOLN
0.0000 [IU] | SUBCUTANEOUS | Status: DC
  Administered 2011-07-16: 4 [IU] via SUBCUTANEOUS

## 2011-07-16 MED ORDER — PHENOL 1.4 % MT LIQD
1.0000 | OROMUCOSAL | Status: DC | PRN
  Filled 2011-07-16: qty 177

## 2011-07-16 MED ORDER — GLYCOPYRROLATE 0.2 MG/ML IJ SOLN
INTRAMUSCULAR | Status: DC | PRN
  Administered 2011-07-16: 0.4 mg via INTRAVENOUS

## 2011-07-16 MED ORDER — CITALOPRAM HYDROBROMIDE 20 MG PO TABS
20.0000 mg | ORAL_TABLET | Freq: Every day | ORAL | Status: DC
  Administered 2011-07-16 – 2011-07-21 (×6): 20 mg via ORAL
  Filled 2011-07-16 (×6): qty 1

## 2011-07-16 MED ORDER — ONDANSETRON HCL 4 MG PO TABS: 4.0000 mg | ORAL_TABLET | Freq: Four times a day (QID) | ORAL | Status: DC | PRN

## 2011-07-16 MED ORDER — EFAVIRENZ-EMTRICITAB-TENOFOVIR 600-200-300 MG PO TABS
1.0000 | ORAL_TABLET | Freq: Every day | ORAL | Status: DC
  Administered 2011-07-16 – 2011-07-20 (×5): 1 via ORAL
  Filled 2011-07-16 (×6): qty 1

## 2011-07-16 MED ORDER — HYDROMORPHONE HCL PF 1 MG/ML IJ SOLN
1.0000 mg | INTRAMUSCULAR | Status: DC | PRN
  Filled 2011-07-16: qty 1

## 2011-07-16 MED ORDER — DOCUSATE SODIUM 100 MG PO CAPS
100.0000 mg | ORAL_CAPSULE | Freq: Two times a day (BID) | ORAL | Status: DC
  Administered 2011-07-16: 100 mg via ORAL

## 2011-07-16 MED ORDER — ALBUTEROL SULFATE (5 MG/ML) 0.5% IN NEBU: 2.5000 mg | INHALATION_SOLUTION | RESPIRATORY_TRACT | Status: DC | PRN

## 2011-07-16 MED ORDER — CEFAZOLIN SODIUM-DEXTROSE 2-3 GM-% IV SOLR
2.0000 g | Freq: Once | INTRAVENOUS | Status: AC
  Administered 2011-07-16: 2 g via INTRAVENOUS

## 2011-07-16 MED ORDER — METHOCARBAMOL 100 MG/ML IJ SOLN
500.0000 mg | Freq: Four times a day (QID) | INTRAVENOUS | Status: DC | PRN
  Administered 2011-07-16: 500 mg via INTRAVENOUS
  Filled 2011-07-16: qty 5

## 2011-07-16 MED ORDER — DIAZEPAM 5 MG PO TABS
7.5000 mg | ORAL_TABLET | Freq: Every evening | ORAL | Status: DC | PRN
  Administered 2011-07-16: 7.5 mg via ORAL
  Administered 2011-07-17: 5 mg via ORAL
  Administered 2011-07-18: 7.5 mg via ORAL
  Filled 2011-07-16 (×2): qty 2
  Filled 2011-07-16: qty 1

## 2011-07-16 MED ORDER — PANTOPRAZOLE SODIUM 40 MG PO TBEC
40.0000 mg | DELAYED_RELEASE_TABLET | Freq: Every day | ORAL | Status: DC
  Administered 2011-07-16 – 2011-07-21 (×6): 40 mg via ORAL
  Filled 2011-07-16 (×7): qty 1

## 2011-07-16 MED ORDER — BUPIVACAINE-EPINEPHRINE PF 0.25-1:200000 % IJ SOLN
INTRAMUSCULAR | Status: AC
  Filled 2011-07-16: qty 30

## 2011-07-16 MED ORDER — OXYCODONE HCL 5 MG PO TABS
5.0000 mg | ORAL_TABLET | ORAL | Status: DC | PRN
  Administered 2011-07-16 – 2011-07-17 (×3): 10 mg via ORAL
  Filled 2011-07-16 (×3): qty 2

## 2011-07-16 MED ORDER — GEMFIBROZIL 600 MG PO TABS
600.0000 mg | ORAL_TABLET | Freq: Two times a day (BID) | ORAL | Status: DC
  Administered 2011-07-16 – 2011-07-21 (×11): 600 mg via ORAL
  Filled 2011-07-16 (×13): qty 1

## 2011-07-16 MED ORDER — BUPROPION HCL ER (XL) 150 MG PO TB24
150.0000 mg | ORAL_TABLET | Freq: Every day | ORAL | Status: DC
  Administered 2011-07-16 – 2011-07-21 (×6): 150 mg via ORAL
  Filled 2011-07-16 (×6): qty 1

## 2011-07-16 MED ORDER — EPHEDRINE SULFATE 50 MG/ML IJ SOLN
INTRAMUSCULAR | Status: DC | PRN
  Administered 2011-07-16: 5 mg via INTRAVENOUS
  Administered 2011-07-16: 10 mg via INTRAVENOUS

## 2011-07-16 MED ORDER — ROCURONIUM BROMIDE 100 MG/10ML IV SOLN
INTRAVENOUS | Status: DC | PRN
  Administered 2011-07-16: 20 mg via INTRAVENOUS

## 2011-07-16 MED ORDER — DOCUSATE SODIUM 100 MG PO CAPS
100.0000 mg | ORAL_CAPSULE | Freq: Two times a day (BID) | ORAL | Status: DC
  Administered 2011-07-16 – 2011-07-21 (×7): 100 mg via ORAL

## 2011-07-16 MED ORDER — CARBAMAZEPINE ER 400 MG PO TB12
600.0000 mg | ORAL_TABLET | Freq: Two times a day (BID) | ORAL | Status: DC
  Administered 2011-07-16 – 2011-07-21 (×11): 600 mg via ORAL
  Filled 2011-07-16 (×12): qty 1

## 2011-07-16 MED ORDER — METOCLOPRAMIDE HCL 10 MG PO TABS: 5.0000 mg | ORAL_TABLET | Freq: Three times a day (TID) | ORAL | Status: DC | PRN

## 2011-07-16 MED ORDER — INSULIN GLARGINE 100 UNIT/ML ~~LOC~~ SOLN
25.0000 [IU] | Freq: Every day | SUBCUTANEOUS | Status: DC
  Administered 2011-07-16: 25 [IU] via SUBCUTANEOUS

## 2011-07-16 MED ORDER — BUDESONIDE-FORMOTEROL FUMARATE 80-4.5 MCG/ACT IN AERO
2.0000 | INHALATION_SPRAY | Freq: Two times a day (BID) | RESPIRATORY_TRACT | Status: DC
  Administered 2011-07-16 – 2011-07-21 (×10): 2 via RESPIRATORY_TRACT
  Filled 2011-07-16: qty 6.9

## 2011-07-16 MED ORDER — ENOXAPARIN SODIUM 40 MG/0.4ML ~~LOC~~ SOLN
40.0000 mg | SUBCUTANEOUS | Status: DC
  Administered 2011-07-17 – 2011-07-21 (×5): 40 mg via SUBCUTANEOUS
  Filled 2011-07-16 (×7): qty 0.4

## 2011-07-16 MED ORDER — ACETAMINOPHEN 325 MG PO TABS: 650.0000 mg | ORAL_TABLET | Freq: Four times a day (QID) | ORAL | Status: DC | PRN

## 2011-07-16 MED ORDER — BISACODYL 10 MG RE SUPP: 10.0000 mg | Freq: Every day | RECTAL | Status: DC | PRN

## 2011-07-16 MED ORDER — FLEET ENEMA 7-19 GM/118ML RE ENEM: 1.0000 | ENEMA | Freq: Once | RECTAL | Status: AC | PRN

## 2011-07-16 MED ORDER — PROPOFOL 10 MG/ML IV BOLUS
INTRAVENOUS | Status: DC | PRN
  Administered 2011-07-16: 150 mg via INTRAVENOUS

## 2011-07-16 MED ORDER — LACTATED RINGERS IV SOLN: INTRAVENOUS | Status: DC

## 2011-07-16 MED ORDER — HYDROMORPHONE HCL PF 1 MG/ML IJ SOLN
1.0000 mg | INTRAMUSCULAR | Status: DC | PRN
  Administered 2011-07-16: 1 mg via INTRAVENOUS
  Filled 2011-07-16 (×2): qty 1

## 2011-07-16 MED ORDER — HYDROMORPHONE HCL PF 2 MG/ML IJ SOLN
3.0000 mg | INTRAMUSCULAR | Status: DC | PRN
  Administered 2011-07-16: 3 mg via INTRAVENOUS
  Administered 2011-07-16: 1 mg via INTRAVENOUS
  Filled 2011-07-16: qty 2
  Filled 2011-07-16: qty 1

## 2011-07-16 MED ORDER — POLYETHYLENE GLYCOL 3350 17 G PO PACK: 17.0000 g | PACK | Freq: Every day | ORAL | Status: DC | PRN

## 2011-07-16 MED ORDER — HYDROMORPHONE HCL PF 1 MG/ML IJ SOLN
INTRAMUSCULAR | Status: AC
  Filled 2011-07-16: qty 2

## 2011-07-16 MED ORDER — INSULIN ASPART 100 UNIT/ML ~~LOC~~ SOLN
0.0000 [IU] | Freq: Every day | SUBCUTANEOUS | Status: DC
  Administered 2011-07-16 – 2011-07-17 (×2): 3 [IU] via SUBCUTANEOUS
  Administered 2011-07-19: 2 [IU] via SUBCUTANEOUS

## 2011-07-16 MED ORDER — CEFAZOLIN SODIUM-DEXTROSE 2-3 GM-% IV SOLR
2.0000 g | Freq: Four times a day (QID) | INTRAVENOUS | Status: AC
  Administered 2011-07-16 – 2011-07-17 (×2): 2 g via INTRAVENOUS
  Filled 2011-07-16 (×2): qty 50

## 2011-07-16 MED ORDER — MENTHOL 3 MG MT LOZG
1.0000 | LOZENGE | OROMUCOSAL | Status: DC | PRN
  Filled 2011-07-16: qty 9

## 2011-07-16 MED ORDER — LIDOCAINE HCL (CARDIAC) 20 MG/ML IV SOLN
INTRAVENOUS | Status: DC | PRN
  Administered 2011-07-16: 50 mg via INTRAVENOUS

## 2011-07-16 MED ORDER — DEXAMETHASONE SODIUM PHOSPHATE 10 MG/ML IJ SOLN
INTRAMUSCULAR | Status: DC | PRN
  Administered 2011-07-16: 10 mg via INTRAVENOUS

## 2011-07-16 MED ORDER — NEOSTIGMINE METHYLSULFATE 1 MG/ML IJ SOLN
INTRAMUSCULAR | Status: DC | PRN
  Administered 2011-07-16: 3 mg via INTRAVENOUS

## 2011-07-16 MED ORDER — SODIUM CHLORIDE 0.9 % IV SOLN: INTRAVENOUS | Status: DC

## 2011-07-16 MED ORDER — MORPHINE SULFATE 2 MG/ML IJ SOLN
1.0000 mg | INTRAMUSCULAR | Status: DC | PRN
  Administered 2011-07-16 – 2011-07-20 (×11): 2 mg via INTRAVENOUS
  Filled 2011-07-16 (×11): qty 1

## 2011-07-16 MED ORDER — ONDANSETRON HCL 4 MG/2ML IJ SOLN
INTRAMUSCULAR | Status: DC | PRN
  Administered 2011-07-16: 4 mg via INTRAVENOUS

## 2011-07-16 MED ORDER — BUPIVACAINE-EPINEPHRINE 0.25% -1:200000 IJ SOLN
INTRAMUSCULAR | Status: DC | PRN
  Administered 2011-07-16: 30 mL

## 2011-07-16 SURGICAL SUPPLY — 39 items
BAG ZIPLOCK 12X15 (MISCELLANEOUS) ×2 IMPLANT
BANDAGE GAUZE ELAST BULKY 4 IN (GAUZE/BANDAGES/DRESSINGS) ×2 IMPLANT
CLOTH BEACON ORANGE TIMEOUT ST (SAFETY) ×2 IMPLANT
CLSR STERI-STRIP ANTIMIC 1/2X4 (GAUZE/BANDAGES/DRESSINGS) ×2 IMPLANT
DRAPE STERI IOBAN 125X83 (DRAPES) ×4 IMPLANT
DRSG EMULSION OIL 3X16 NADH (GAUZE/BANDAGES/DRESSINGS) ×2 IMPLANT
DRSG MEPILEX BORDER 4X8 (GAUZE/BANDAGES/DRESSINGS) ×2 IMPLANT
DRSG PAD ABDOMINAL 8X10 ST (GAUZE/BANDAGES/DRESSINGS) ×2 IMPLANT
DURAPREP 26ML APPLICATOR (WOUND CARE) ×2 IMPLANT
ELECT REM PT RETURN 9FT ADLT (ELECTROSURGICAL) ×2
ELECTRODE REM PT RTRN 9FT ADLT (ELECTROSURGICAL) ×1 IMPLANT
EVACUATOR 1/8 PVC DRAIN (DRAIN) ×2 IMPLANT
GLOVE BIO SURGEON STRL SZ7.5 (GLOVE) ×2 IMPLANT
GLOVE BIO SURGEON STRL SZ8 (GLOVE) ×2 IMPLANT
GOWN STRL NON-REIN LRG LVL3 (GOWN DISPOSABLE) ×2 IMPLANT
GOWN STRL REIN XL XLG (GOWN DISPOSABLE) ×2 IMPLANT
MANIFOLD NEPTUNE II (INSTRUMENTS) ×2 IMPLANT
NS IRRIG 1000ML POUR BTL (IV SOLUTION) ×2 IMPLANT
PACK GENERAL/GYN (CUSTOM PROCEDURE TRAY) ×2 IMPLANT
PAD CAST 4YDX4 CTTN HI CHSV (CAST SUPPLIES) ×1 IMPLANT
PADDING CAST COTTON 4X4 STRL (CAST SUPPLIES) ×1
PIN THREADED GUIDE ACE (PIN) ×6 IMPLANT
POSITIONER SURGICAL ARM (MISCELLANEOUS) ×2 IMPLANT
SCREW CANN 22X6.5X100 (Screw) ×1 IMPLANT
SCREW CANN 6.5 100MM (Screw) ×1 IMPLANT
SCREW CANN 6.5 90MM (Screw) ×4 IMPLANT
SCREW CANN LG 6.5 FLT 90X22 (Screw) ×2 IMPLANT
SPONGE GAUZE 4X4 12PLY (GAUZE/BANDAGES/DRESSINGS) ×2 IMPLANT
SPONGE LAP 18X18 X RAY DECT (DISPOSABLE) ×2 IMPLANT
STRIP CLOSURE SKIN 1/2X4 (GAUZE/BANDAGES/DRESSINGS) ×4 IMPLANT
SUT MNCRL 0 MO-4 VIOLET 18 CR (SUTURE) ×1 IMPLANT
SUT MNCRL AB 3-0 PS2 18 (SUTURE) IMPLANT
SUT MONOCRYL 0 MO 4 18  CR/8 (SUTURE) ×1
SUT VIC AB 1 CT1 36 (SUTURE) ×4 IMPLANT
SUT VIC AB 2-0 CT1 27 (SUTURE) ×2
SUT VIC AB 2-0 CT1 TAPERPNT 27 (SUTURE) ×2 IMPLANT
TOWEL OR 17X26 10 PK STRL BLUE (TOWEL DISPOSABLE) ×4 IMPLANT
TRAY FOLEY CATH 14FRSI W/METER (CATHETERS) IMPLANT
WATER STERILE IRR 1500ML POUR (IV SOLUTION) IMPLANT

## 2011-07-16 NOTE — ED Notes (Signed)
Pt alert, arrives from home c/o left hip amd upper thigh pain, onset this evening s/p slip fall injury, resp even unlabored, skin pwd, pt unable to bear weight on affected ext, PMS intact, no outward rotation or shortening noted

## 2011-07-16 NOTE — Care Management Note (Unsigned)
    Page 1 of 2   07/18/2011     5:02:15 PM   CARE MANAGEMENT NOTE 07/18/2011  Patient:  Necaise,Aubreyanna I   Account Number:  [id number]  Date Initiated:  07/16/2011  Documentation initiated by:  Sherrin Daisy  Subjective/Objective Assessment:   DX LEFT HIP FRACTURE ; LEFT HIP PINNING     Action/Plan:   PT/OT EVAL POST OP DAY #1/CM WILL FOLLOW AFTER COMPLETED   Anticipated DC Date:  07/19/2011   Anticipated DC Plan:  [place]  In-house referral  NA      DC Planning Services  CM consult      [facility] Choice  HOME HEALTH   Choice offered to / List presented to:  C-1 Patient   DME arranged  NA      DME agency  NA     [place] arranged  HH-2 PT      [place]   Status of service:  Completed, signed off Medicare Important Message given?   (If response is "NO", the following Medicare IM given date fields will be blank) Date Medicare IM given:   Date Additional Medicare IM given:    Discharge Disposition:    Per UR Regulation:  Reviewed for med. necessity/level of care/duration of stay  If discussed at [place] of Stay Meetings, dates discussed:    Comments:  07/18/2011 Fredonia Highland BSN CCM [phone] Arville Go will provide HHpt with start date day after discharge. Pt states shje plans to go to mother's home where spouse and mother will be her caregivers. Already has RW.

## 2011-07-16 NOTE — Transfer of Care (Signed)
Immediate Anesthesia Transfer of Care Note  Patient: [name]  Procedure(s) Performed: Procedure(s) (LRB): CANNULATED HIP PINNING (Left)  Patient Location: PACU  Anesthesia Type: General  Level of Consciousness: sedated  Airway & Oxygen Therapy: Patient Spontanous Breathing and Patient connected to face mask oxygen  Post-op Assessment: Report given to PACU RN and Post -op Vital signs reviewed and stable  Post vital signs: Reviewed and stable  Complications: No apparent anesthesia complications

## 2011-07-16 NOTE — ED Notes (Signed)
MD at bedside. 

## 2011-07-16 NOTE — Interval H&P Note (Signed)
History and Physical Interval Note:  07/16/2011 2:35 PM  [name]  has presented today for surgery, with the diagnosis of left hip fracture  The various methods of treatment have been discussed with the patient and family. After consideration of risks, benefits and other options for treatment, the patient has consented to  Procedure(s) (LRB): CANNULATED HIP PINNING (Left) as a surgical intervention .  The patient's history has been reviewed, patient examined, no change in status, stable for surgery.  I have reviewed the patients' chart and labs.  Questions were answered to the patient's satisfaction.     Gearlean Alf

## 2011-07-16 NOTE — ED Notes (Signed)
Patient transported to X-ray 

## 2011-07-16 NOTE — Brief Op Note (Signed)
07/16/2011  3:54 PM  PATIENT:  [name]  46 y.o. female  PRE-OPERATIVE DIAGNOSIS:  left hip fracture  POST-OPERATIVE DIAGNOSIS:  left hip fracture  PROCEDURE:  Procedure(s) (LRB): CANNULATED HIP PINNING (Left)  SURGEON:  Surgeon(s) and Role:    * Gearlean Alf, MD - Primary  PHYSICIAN ASSISTANT:   ASSISTANTS: none   ANESTHESIA:   general  EBL:  Total I/O In: 1240 [P.O.:40; I.V.:1200] Out: 1500 [Urine:1450; Blood:50]  BLOOD ADMINISTERED:none  DRAINS: none   LOCAL MEDICATIONS USED:  MARCAINE     DICTATION: .Other Dictation: Dictation Number [id number]  PLAN OF CARE: Admit to inpatient   PATIENT DISPOSITION:  PACU - hemodynamically stable.   [id number]}

## 2011-07-16 NOTE — ED Provider Notes (Signed)
History     CSN: [id number]  Arrival date & time 07/16/11  0211   First MD Initiated Contact with Patient 07/16/11 0240      Chief Complaint  Patient presents with  . Hip Pain  . Fall    (Consider location/radiation/quality/duration/timing/severity/associated sxs/prior treatment) HPI Comments: 46 year old obese female with a history of diabetes, hypertension, acid reflux disease and HIV infection who presents with a complaint of left hip pain after a fall. She states this occurred approximately 4 hours prior to evaluation, was acute in onset when she lost her balance tripping over a pet while walking her dog.  The pain was acute, constant, worse with palpation and range of motion and she was unable to get up off the ground by herself. She called paramedics who help her get into her car but did not transport her.  She denies any recent fevers chills nausea vomiting abdominal pain chest pain back pain blurred vision numbness or weakness. She has no dysuria or diarrhea. She does have a mild headache after the fall as she did bump the left side of her head. There was no loss of consciousness  Patient is a 46 y.o. female presenting with hip pain and fall. The history is provided by the patient, a parent and a relative.  Hip Pain  Fall    Past Medical History  Diagnosis Date  . Anxiety   . Depression   . Diabetes mellitus   . Hypertension   . Asthma   . HIV infection 2000    History reviewed. No pertinent past surgical history.  Family History  Problem Relation Age of Onset  . Diabetes Mother   . Hypertension Mother   . Vision loss Mother   . Hypertension Father     History  Substance Use Topics  . Smoking status: Current Everyday Smoker -- 0.5 packs/day for 35 years    Types: Cigarettes  . Smokeless tobacco: Never Used  . Alcohol Use: No    OB History    Grav Para Term Preterm Abortions TAB SAB Ect Mult Living   2 1 1  0 1  1 0 0 1      Review of Systems  All  other systems reviewed and are negative.    Allergies  Review of patient's allergies indicates no known allergies.  Home Medications   Current Outpatient Rx  Name Route Sig Dispense Refill  . ALBUTEROL SULFATE (2.5 MG/3ML) 0.083% IN NEBU Nebulization Take 2.5 mg by nebulization every 6 (six) hours as needed.      . ATENOLOL 25 MG PO TABS Oral Take 25 mg by mouth daily.    . BUDESONIDE-FORMOTEROL FUMARATE 80-4.5 MCG/ACT IN AERO Inhalation Inhale 2 puffs into the lungs 2 (two) times daily.    . BUPROPION HCL ER (XL) 150 MG PO TB24 Oral Take 150 mg by mouth daily.    [name] Kitchen CARBAMAZEPINE ER 200 MG PO TB12 Oral Take 600 mg by mouth 2 (two) times daily.    . CELECOXIB 100 MG PO CAPS Oral Take 100 mg by mouth 2 (two) times daily.      [name] Kitchen CITALOPRAM HYDROBROMIDE 20 MG PO TABS Oral Take 20 mg by mouth daily.      [name] Kitchen DIAZEPAM 5 MG PO TABS Oral Take 7.5 mg by mouth at bedtime as needed. 5 mg tab, 1/2 tab nightly    . EFAVIRENZ-EMTRICITAB-TENOFOVIR 600-200-300 MG PO TABS Oral Take 1 tablet by mouth at bedtime.    [name] Kitchen ESTRADIOL 2  MG PO TABS Oral Take 2 mg by mouth daily.    [name] Kitchen FLUCONAZOLE 100 MG PO TABS Oral Take 100 mg by mouth daily.    [name] Kitchen GEMFIBROZIL 600 MG PO TABS Oral Take 600 mg by mouth 2 (two) times daily before a meal. 1/2 hour before meals    . GLIPIZIDE 10 MG PO TABS Oral Take 10 mg by mouth 2 (two) times daily before a meal.    . HYDROCHLOROTHIAZIDE 25 MG PO TABS Oral Take 25 mg by mouth daily.    . INSULIN ASPART 100 UNIT/ML [place] SOLN  Per sliding scale (15 units before bedtime)    . INSULIN GLARGINE 100 UNIT/ML [place] SOLN Subcutaneous Inject 50 Units into the skin 2 (two) times daily.    [name] Kitchen METFORMIN HCL 1000 MG PO TABS Oral Take 1,000 mg by mouth 2 (two) times daily with a meal.    . PANTOPRAZOLE SODIUM 40 MG PO TBEC Oral Take 40 mg by mouth daily.    . TRAMADOL HCL 50 MG PO TABS Oral Take 50 mg by mouth every 8 (eight) hours as needed.      BP 165/103  Pulse 84  Resp 20  SpO2 97%  LMP  11/16/2010  Physical Exam  Nursing note and vitals reviewed. Constitutional: She appears well-developed and well-nourished.       Uncomfortable appearing  HENT:  Head: Normocephalic and atraumatic.  Mouth/Throat: Oropharynx is clear and moist. No oropharyngeal exudate.  Eyes: Conjunctivae and EOM are normal. Pupils are equal, round, and reactive to light. Right eye exhibits no discharge. Left eye exhibits no discharge. No scleral icterus.  Neck: Normal range of motion. Neck supple. No JVD present. No thyromegaly present.  Cardiovascular: Normal rate, regular rhythm, normal heart sounds and intact distal pulses.  Exam reveals no gallop and no friction rub.   No murmur heard.      Pulses intact to the dorsalis pedis and posterior tibial arteries of the left foot.  Pulmonary/Chest: Effort normal and breath sounds normal. No respiratory distress. She has no wheezes. She has no rales.  Abdominal: Soft. Bowel sounds are normal. She exhibits no distension and no mass. There is no tenderness.  Musculoskeletal: Normal range of motion. She exhibits tenderness ( Focal tenderness to palpation of the left hip over the greater trochanter, decreased range of motion secondary to significant pain with both internal and external rotation as well as flexion of the hip.). She exhibits no edema.  Lymphadenopathy:    She has no cervical adenopathy.  Neurological: She is alert. Coordination normal.       Sensation intact to the left lower extremity  Skin: Skin is warm and dry. No rash noted. No erythema.  Psychiatric: She has a normal mood and affect. Her behavior is normal.    ED Course  Procedures (including critical care time)  Labs Reviewed  CBC WITH DIFFERENTIAL - Abnormal; Notable for the following:    WBC 17.0 (*)     Hemoglobin 11.7 (*)     RDW 16.5 (*)     Platelets 409 (*)     Neutrophils Relative 84 (*)     Neutro Abs 14.3 (*)     Lymphocytes Relative 10 (*)     All other components within  normal limits  BASIC METABOLIC PANEL - Abnormal; Notable for the following:    Sodium 132 (*)     Glucose, Bld 202 (*)     GFR calc non Af Amer 73 (*)  GFR calc Af Amer 85 (*)     All other components within normal limits  APTT - Abnormal; Notable for the following:    aPTT 41 (*)     All other components within normal limits  URINALYSIS, ROUTINE W REFLEX MICROSCOPIC - Abnormal; Notable for the following:    APPearance CLOUDY (*)     Glucose, UA >1000 (*)     All other components within normal limits  URINE MICROSCOPIC-ADD ON - Abnormal; Notable for the following:    Squamous Epithelial / LPF FEW (*)     Bacteria, UA MANY (*)     All other components within normal limits  TYPE AND SCREEN  PROTIME-INR  ABO/RH   Dg Hip Complete Left  07/16/2011  *RADIOLOGY REPORT*  Clinical Data: Severe left hip pain after fall.  LEFT HIP - COMPLETE 2+ VIEW  Comparison: None.  Findings: There is a transverse fracture of the left femoral neck with mild varus angulation of the fracture fragments.  No focal bone lesion is appreciated.  The pelvis and visualized portion of the sacrum appears intact.  Degenerative changes in the lower lumbar spine and in both hips.  IMPRESSION: Fracture of the left femoral neck with mild varus angulation.  Original Report Authenticated By: Neale Burly, M.D.     1. Fracture of left hip       MDM  Rule out fracture of the hip, vital signs show mild hypertension, no tachycardia, no other obvious injuries and no neurologic deficits. Her vascular status is intact at this time, imaging pending, pain medication, preop labs and imaging pending.  I do not feel at length discrepancy but she has significant tenderness with range of motion.   5:28 AM I personally interpreted the x-ray and find her to be a femoral neck fracture of the left hip. Labs pending, pain medication pending as the patient is not have IV access. This is being worked on at this time by Engineer, civil (consulting),  we'll page orthopedics after laboratory results available.   5:28 AM  Discussed care with the hospitalist and the orthopedist, hospitalist to admit the patient, pain control results show slight hyponatremic, slight leukocytosis of 17,000, clear urinalysis with mild hyperglycemia.  Johnna Acosta, MD 07/16/11 [phone]

## 2011-07-16 NOTE — H&P (Signed)
[facility] History and Physical  [name] [id number] DOB: October 05, 1965    PCP:   Dr Tomma Lightning  Chief Complaint: left hip fracture from a fall.   HPI: [name] is an 46 y.o. female with Hx of HIV, compliant with her meds, Hx of DM, HTN, Hyperlipidemia, morbid obesity, depression, fell today as she was walking her dog, suffered a left femeral neck fracture.  She denied any chest pain, SOB, N/V.  Her other work up in the ER included an elevated WBC of 17K, Hb 11.7 g/DL, INR of 1.0, and UA without definite evidence of a UTI.  Orthopedics was consulted, and hospitalist was asked to admit this patient for left hip fracture.  Rewiew of Systems:   Constitutional: Negative for malaise, fever and chills. No significant weight loss or weight gain Eyes: Negative for eye pain, redness and discharge, diplopia, visual changes, or flashes of light. ENMT: Negative for ear pain, hoarseness, nasal congestion, sinus pressure and sore throat. No headaches; tinnitus, drooling, or problem swallowing. Cardiovascular: Negative for chest pain, palpitations, diaphoresis, dyspnea and peripheral edema. ; No orthopnea, PND Respiratory: Negative for cough, hemoptysis, wheezing and stridor. No pleuritic chestpain. Gastrointestinal: Negative for nausea, vomiting, diarrhea, constipation, abdominal pain, melena, blood in stool, hematemesis, jaundice and rectal bleeding.    Genitourinary: Negative for frequency, dysuria, incontinence,flank pain and hematuria; Musculoskeletal: Negative for back pain and neck pain. Negative for swelling.  Skin: . Negative for pruritus, rash, abrasions, bruising and skin lesion.; ulcerations Neuro: Negative for headache, lightheadedness and neck stiffness. Negative for weakness, altered level of consciousness , altered mental status, extremity weakness, involuntary movement, seizure and syncope.  Psych: negative for anxiety, insomnia, tearfulness, panic attacks,  hallucinations, paranoia, suicidal or homicidal ideation     Past Medical History  Diagnosis Date  . Anxiety   . Depression   . Diabetes mellitus   . Hypertension   . Asthma   . HIV infection 2000    History reviewed. No pertinent past surgical history.  Medications:  HOME MEDS: Prior to Admission medications   Medication Sig Start Date End Date Taking? Authorizing Provider  albuterol (PROVENTIL) (2.5 MG/3ML) 0.083% nebulizer solution Take 2.5 mg by nebulization every 6 (six) hours as needed.     Yes Historical Provider, MD  atenolol (TENORMIN) 25 MG tablet Take 25 mg by mouth daily.   Yes Historical Provider, MD  budesonide-formoterol (SYMBICORT) 80-4.5 MCG/ACT inhaler Inhale 2 puffs into the lungs 2 (two) times daily. 06/29/10  Yes Lyndee Hensen, NP  buPROPion (WELLBUTRIN XL) 150 MG 24 hr tablet Take 150 mg by mouth daily.   Yes Historical Provider, MD  carbamazepine (TEGRETOL XR) 200 MG 12 hr tablet Take 600 mg by mouth 2 (two) times daily. 05/22/10  Yes Campbell Riches, MD  celecoxib (CELEBREX) 100 MG capsule Take 100 mg by mouth 2 (two) times daily.     Yes Historical Provider, MD  citalopram (CELEXA) 20 MG tablet Take 20 mg by mouth daily.     Yes Historical Provider, MD  diazepam (VALIUM) 5 MG tablet Take 7.5 mg by mouth at bedtime as needed. 5 mg tab, 1/2 tab nightly 07/17/10  Yes Thayer Headings, MD  efavirenz-emtricitabine-tenofovir (ATRIPLA) 600-200-300 MG per tablet Take 1 tablet by mouth at bedtime. 10/02/10  Yes Campbell Riches, MD  estradiol (ESTRACE) 2 MG tablet Take 2 mg by mouth daily.   Yes Historical Provider, MD  fluconazole (DIFLUCAN) 100 MG tablet Take 100 mg by mouth  daily. 12/11/10  Yes Campbell Riches, MD  gemfibrozil (LOPID) 600 MG tablet Take 600 mg by mouth 2 (two) times daily before a meal. 1/2 hour before meals 09/14/10  Yes Thayer Headings, MD  glipiZIDE (GLUCOTROL) 10 MG tablet Take 10 mg by mouth 2 (two) times daily before a meal.   Yes  Historical Provider, MD  hydrochlorothiazide (HYDRODIURIL) 25 MG tablet Take 25 mg by mouth daily.   Yes Historical Provider, MD  insulin aspart (NOVOLOG) 100 UNIT/ML injection Per sliding scale (15 units before bedtime) 08/09/10  Yes Lyndee Hensen, NP  insulin glargine (LANTUS) 100 UNIT/ML injection Inject 50 Units into the skin 2 (two) times daily. 06/09/10  Yes Campbell Riches, MD  metFORMIN (GLUCOPHAGE) 1000 MG tablet Take 1,000 mg by mouth 2 (two) times daily with a meal.   Yes Historical Provider, MD  pantoprazole (PROTONIX) 40 MG tablet Take 40 mg by mouth daily. 07/13/10  Yes Lyndee Hensen, NP  traMADol (ULTRAM) 50 MG tablet Take 50 mg by mouth every 8 (eight) hours as needed.   Yes Historical Provider, MD     Allergies:  No Known Allergies  Social History:   reports that she has been smoking Cigarettes.  She has a 17.5 pack-year smoking history. She has never used smokeless tobacco. She reports that she does not drink alcohol or use illicit drugs.  Family History: Family History  Problem Relation Age of Onset  . Diabetes Mother   . Hypertension Mother   . Vision loss Mother   . Hypertension Father      Physical Exam: Filed Vitals:   07/16/11 0220 07/16/11 0559  BP: 165/103 106/72  Pulse: 84 85  Resp: 20 18  SpO2: 97% 95%   Blood pressure 106/72, pulse 85, resp. rate 18, last menstrual period 11/16/2010, SpO2 95.00%.  GEN:  Pleasant patient lying in the stretcher in no acute distress; cooperative with exam. PSYCH:  alert and oriented x4; does not appear anxious or depressed; affect is appropriate. HEENT: Mucous membranes pink and anicteric; PERRLA; EOM intact; no cervical lymphadenopathy nor thyromegaly or carotid bruit; no JVD; There were no stridor. Neck is very supple. Breasts:: Not examined CHEST WALL: No tenderness CHEST: Normal respiration, clear to auscultation bilaterally.  HEART: Regular rate and rhythm.  There are no murmur, rub, or gallops.     BACK: No kyphosis or scoliosis; no CVA tenderness ABDOMEN: soft and non-tender; no masses, no organomegaly, normal abdominal bowel sounds; no pannus; no intertriginous candida. There is no rebound and no distention. Rectal Exam: Not done EXTREMITIES: age-appropriate arthropathy of the hands and knees; no edema; no ulcerations.  There is no calf tenderness. Genitalia: not examined PULSES: 2+ and symmetric SKIN: Normal hydration no rash or ulceration CNS: Cranial nerves 2-12 grossly intact no focal lateralizing neurologic deficit.  Speech is fluent; uvula elevated with phonation, facial symmetry and tongue midline. DTR are normal bilaterally, cerebella exam is intact, barbinski is negative and strengths are equaled bilaterally.  No sensory loss.   Labs on Admission:  Basic Metabolic Panel:  Lab [id number] 0255  NA 132*  K 4.6  CL 97  CO2 22  GLUCOSE 202*  BUN 20  CREATININE 0.92  CALCIUM 9.2  MG --  PHOS --   Liver Function Tests: No results found for this basename: AST:5,ALT:5,ALKPHOS:5,BILITOT:5,PROT:5,ALBUMIN:5 in the last 168 hours No results found for this basename: LIPASE:5,AMYLASE:5 in the last 168 hours No results found for this basename: AMMONIA:5 in the last  168 hours CBC:  Lab 07/16/11 0255  WBC 17.0*  NEUTROABS 14.3*  HGB 11.7*  HCT 36.1  MCV 84.7  PLT 409*   Cardiac Enzymes: No results found for this basename: CKTOTAL:5,CKMB:5,CKMBINDEX:5,TROPONINI:5 in the last 168 hours  CBG: No results found for this basename: GLUCAP:5 in the last 168 hours   Radiological Exams on Admission: Dg Hip Complete Left  07/16/2011  *RADIOLOGY REPORT*  Clinical Data: Severe left hip pain after fall.  LEFT HIP - COMPLETE 2+ VIEW  Comparison: None.  Findings: There is a transverse fracture of the left femoral neck with mild varus angulation of the fracture fragments.  No focal bone lesion is appreciated.  The pelvis and visualized portion of the sacrum appears intact.  Degenerative  changes in the lower lumbar spine and in both hips.  IMPRESSION: Fracture of the left femoral neck with mild varus angulation.  Original Report Authenticated By: Neale Burly, M.D.     Assessment/Plan Present on Admission:  .Hip fracture, left .HIV DISEASE .DIABETES MELLITUS, TYPE II .PERIPHERAL NEUROPATHY .HYPERTENSION .Tobacco use disorder .HYPERLIPIDEMIA  PLAN:  Will admit to general medical floor.  Patient is clear for surgery if her EKG is unremarkable.  I will order an EKG since it was not done.  Her lab work is rather unremarkable as well.  I have given her subQ heparin for DVT prophylaxis pending surgery.  For her DM, will hold her oral hypoglycemic, decrease her Lantus from 50 units BID to 25units BID being NPO, and give D5 IVF.  With her HIV, I have continue her antiviral meds.  Will continue her meds for HTN, except diurectics.  I strongly recommended that she stop her cigarettes.  She is stable, full code, and will be admitted to [facility] service.   Other plans as per orders.  Code Status: FULL.   Orvan Falconer, MD. [facility] Pager [phone] 7pm to 7am.  07/16/2011, 6:28 AM

## 2011-07-16 NOTE — Anesthesia Postprocedure Evaluation (Signed)
  Anesthesia Post-op Note  Patient: [name]  Procedure(s) Performed: Procedure(s) (LRB): CANNULATED HIP PINNING (Left)  Patient Location: PACU  Anesthesia Type: General  Level of Consciousness: awake and alert   Airway and Oxygen Therapy: Patient Spontanous Breathing  Post-op Pain: mild  Post-op Assessment: Post-op Vital signs reviewed, Patient's Cardiovascular Status Stable, Respiratory Function Stable, Patent Airway and No signs of Nausea or vomiting  Post-op Vital Signs: stable  Complications: No apparent anesthesia complications

## 2011-07-16 NOTE — Progress Notes (Signed)
Patient seen and examined by me.  H&P done this AM by Dr. Marin Comment.  Patient planned for hip surgery today.  Pain is tolerable with pain meds for only about 30 mins- will adjust.  [name]

## 2011-07-16 NOTE — Anesthesia Preprocedure Evaluation (Signed)
Anesthesia Evaluation  Patient identified by MRN, date of birth, ID band Patient awake    Reviewed: Allergy & Precautions, H&P , NPO status , Patient's Chart, lab work & pertinent test results  Airway Mallampati: II TM Distance: >3 FB Neck ROM: Full    Dental No notable dental hx.    Pulmonary neg pulmonary ROS, asthma , Current Smoker,  breath sounds clear to auscultation  Pulmonary exam normal       Cardiovascular hypertension, Pt. on medications negative cardio ROS  Rhythm:Regular Rate:Normal     Neuro/Psych PSYCHIATRIC DISORDERS Anxiety Depression  Neuromuscular disease negative neurological ROS  negative psych ROS   GI/Hepatic negative GI ROS, Neg liver ROS,   Endo/Other  negative endocrine ROSInsulin Dependent and Oral Hypoglycemic Agents  Renal/GU negative Renal ROS  negative genitourinary   Musculoskeletal negative musculoskeletal ROS (+)   Abdominal (+) + obese,   Peds negative pediatric ROS (+)  Hematology negative hematology ROS (+) HIV,   Anesthesia Other Findings   Reproductive/Obstetrics negative OB ROS                           Anesthesia Physical Anesthesia Plan  ASA: III  Anesthesia Plan: General   Post-op Pain Management:    Induction: Intravenous  Airway Management Planned: Oral ETT  Additional Equipment:   Intra-op Plan:   Post-operative Plan: Extubation in OR  Informed Consent: I have reviewed the patients History and Physical, chart, labs and discussed the procedure including the risks, benefits and alternatives for the proposed anesthesia with the patient or authorized representative who has indicated his/her understanding and acceptance.   Dental advisory given  Plan Discussed with: CRNA  Anesthesia Plan Comments:         Anesthesia Quick Evaluation

## 2011-07-16 NOTE — Anesthesia Procedure Notes (Signed)
Procedure Name: Intubation Date/Time: 07/16/2011 2:53 PM Performed by: Lind Covert Pre-anesthesia Checklist: Patient identified, Timeout performed, Emergency Drugs available, Suction available and Patient being monitored Patient Re-evaluated:Patient Re-evaluated prior to inductionOxygen Delivery Method: Circle system utilized Preoxygenation: Pre-oxygenation with 100% oxygen Intubation Type: IV induction, Cricoid Pressure applied and Rapid sequence Laryngoscope Size: Mac and 4 Grade View: Grade I Tube type: Oral Tube size: 7.5 mm Number of attempts: 1 Airway Equipment and Method: Stylet Placement Confirmation: ETT inserted through vocal cords under direct vision,  breath sounds checked- equal and bilateral and positive ETCO2 Secured at: 22 cm Tube secured with: Tape Dental Injury: Teeth and Oropharynx as per pre-operative assessment

## 2011-07-16 NOTE — Consult Note (Signed)
Reason for Consult: Left femoral neck fracture Referring Physician: ED- Dr. Fredna Dow [name] is an 46 y.o. female.  HPI: [name] is a 46 yo female with multiple medical problems who was in her yard last night and tripped over her cat landing on her left side with immediate left hip pain. Hit her left side on the concrete and also hit her head on the grass. No LOC, syncope, blurred vision or other sequelae. Main complaint is left hip pain. No paresthesia. No knee pain. No RLE or BUE complaints.  Past Medical History  Diagnosis Date  . Anxiety   . Depression   . Diabetes mellitus   . Hypertension   . Asthma   . HIV infection 2000    History reviewed. No pertinent past surgical history.  Family History  Problem Relation Age of Onset  . Diabetes Mother   . Hypertension Mother   . Vision loss Mother   . Hypertension Father     Social History:  reports that she has been smoking Cigarettes.  She has a 17.5 pack-year smoking history. She has never used smokeless tobacco. She reports that she does not drink alcohol or use illicit drugs.  Allergies: No Known Allergies  Medications: Erin have reviewed the patient's current medications.  Results for orders placed during the hospital encounter of 07/16/11 (from the past 48 hour(s))  CBC WITH DIFFERENTIAL     Status: Abnormal   Collection Time   07/16/11  2:55 AM      Component Value Range Comment   WBC 17.0 (*) 4.0 - 10.5 K/uL    RBC 4.26  3.87 - 5.11 MIL/uL    Hemoglobin 11.7 (*) 12.0 - 15.0 g/dL    HCT 36.1  36.0 - 46.0 %    MCV 84.7  78.0 - 100.0 fL    MCH 27.5  26.0 - 34.0 pg    MCHC 32.4  30.0 - 36.0 g/dL    RDW 16.5 (*) 11.5 - 15.5 %    Platelets 409 (*) 150 - 400 K/uL    Neutrophils Relative 84 (*) 43 - 77 %    Neutro Abs 14.3 (*) 1.7 - 7.7 K/uL    Lymphocytes Relative 10 (*) 12 - 46 %    Lymphs Abs 1.6  0.7 - 4.0 K/uL    Monocytes Relative 4  3 - 12 %    Monocytes Absolute 0.8  0.1 - 1.0 K/uL    Eosinophils Relative  1  0 - 5 %    Eosinophils Absolute 0.2  0.0 - 0.7 K/uL    Basophils Relative 1  0 - 1 %    Basophils Absolute 0.1  0.0 - 0.1 K/uL   BASIC METABOLIC PANEL     Status: Abnormal   Collection Time   07/16/11  2:55 AM      Component Value Range Comment   Sodium 132 (*) 135 - 145 mEq/L    Potassium 4.6  3.5 - 5.1 mEq/L    Chloride 97  96 - 112 mEq/L    CO2 22  19 - 32 mEq/L    Glucose, Bld 202 (*) 70 - 99 mg/dL    BUN 20  6 - 23 mg/dL    Creatinine, Ser 0.92  0.50 - 1.10 mg/dL    Calcium 9.2  8.4 - 10.5 mg/dL    GFR calc non Af Amer 73 (*) >90 mL/min    GFR calc Af Amer 85 (*) >90 mL/min  PROTIME-INR     Status: Normal   Collection Time   07/16/11  2:55 AM      Component Value Range Comment   Prothrombin Time 13.5  11.6 - 15.2 seconds    INR 1.01  0.00 - 1.49   APTT     Status: Abnormal   Collection Time   07/16/11  2:55 AM      Component Value Range Comment   aPTT 41 (*) 24 - 37 seconds   TYPE AND SCREEN     Status: Normal   Collection Time   07/16/11  3:40 AM      Component Value Range Comment   ABO/RH(D) O NEG      Antibody Screen NEG      Sample Expiration 07/19/2011     URINALYSIS, ROUTINE W REFLEX MICROSCOPIC     Status: Abnormal   Collection Time   07/16/11  4:10 AM      Component Value Range Comment   Color, Urine YELLOW  YELLOW    APPearance CLOUDY (*) CLEAR    Specific Gravity, Urine 1.025  1.005 - 1.030    pH 6.0  5.0 - 8.0    Glucose, UA >1000 (*) NEGATIVE mg/dL    Hgb urine dipstick NEGATIVE  NEGATIVE    Bilirubin Urine NEGATIVE  NEGATIVE    Ketones, ur NEGATIVE  NEGATIVE mg/dL    Protein, ur NEGATIVE  NEGATIVE mg/dL    Urobilinogen, UA 0.2  0.0 - 1.0 mg/dL    Nitrite NEGATIVE  NEGATIVE    Leukocytes, UA NEGATIVE  NEGATIVE   URINE MICROSCOPIC-ADD ON     Status: Abnormal   Collection Time   07/16/11  4:10 AM      Component Value Range Comment   Squamous Epithelial / LPF FEW (*) RARE    WBC, UA 3-6  <3 WBC/hpf    Bacteria, UA MANY (*) RARE     Dg Hip  Complete Left  07/16/2011  *RADIOLOGY REPORT*  Clinical Data: Severe left hip pain after fall.  LEFT HIP - COMPLETE 2+ VIEW  Comparison: None.  Findings: There is a transverse fracture of the left femoral neck with mild varus angulation of the fracture fragments.  No focal bone lesion is appreciated.  The pelvis and visualized portion of the sacrum appears intact.  Degenerative changes in the lower lumbar spine and in both hips.  IMPRESSION: Fracture of the left femoral neck with mild varus angulation.  Original Report Authenticated By: Neale Burly, M.D.    ROS Blood pressure 130/74, pulse 81, temperature 97.5 F (36.4 C), temperature source Oral, resp. rate 18, height 5\' 11"  (1.803 m), weight 104.327 kg (230 lb), last menstrual period 11/16/2010, SpO2 96.00%. Physical Exam Physical Examination: General appearance - alert, well appearing, and in no distress Mental status - alert, oriented to person, place, and time Chest - clear to auscultation, no wheezes, rales or rhonchi, symmetric air entry Heart - normal rate, regular rhythm, normal S1, S2, no murmurs, rubs, clicks or gallops Abdomen - soft, nontender, nondistended, no masses or organomegaly Neurological - alert, oriented, normal speech, no focal findings or movement disorder noted LLE- no deformity. EHL/FHL intact, pulses and sensation intact, no knee or ankle tenderness or deformity. Tender over lateral hip, pain on any attempted hip rotation  X-ray- non-displaced left femoral neck fracture  Assessment/Plan: Left femoral neck fracture- Will require operative fixation for pain relief and for mobilization. Fracture is currently non-displaced and will plan on in-situ pinning.  Discussed procedure risks and potential comps with patient who elects to proceed. Erin told patient and family that there is a high likelihood of her healing this fracture with good pain relief and return of function with appropriate rehab but that there is a small  (<10%) chance that the fixation can fail, fracture can collapse, blood supply can be lost to the hip and she would eventually require an arthroplasty procedure if this occurred. They appear to understand and agree to proceed.  [name] 07/16/2011, 6:59 AM

## 2011-07-17 ENCOUNTER — Encounter (HOSPITAL_COMMUNITY): Payer: Self-pay | Admitting: Orthopedic Surgery

## 2011-07-17 DIAGNOSIS — B370 Candidal stomatitis: Secondary | ICD-10-CM

## 2011-07-17 LAB — BASIC METABOLIC PANEL
Calcium: 8.1 mg/dL — ABNORMAL LOW (ref 8.4–10.5)
Creatinine, Ser: 0.84 mg/dL (ref 0.50–1.10)
GFR calc Af Amer: >90 mL/min (ref >90)

## 2011-07-17 LAB — GLUCOSE, CAPILLARY
Glucose-Capillary: 234 mg/dL — ABNORMAL HIGH (ref 70–99)
Glucose-Capillary: 239 mg/dL — ABNORMAL HIGH (ref 70–99)
Glucose-Capillary: 275 mg/dL — ABNORMAL HIGH (ref 70–99)

## 2011-07-17 LAB — CBC
Platelets: 302 10*3/uL (ref 150–400)
RDW: 16.9 % — ABNORMAL HIGH (ref 11.5–15.5)
WBC: 9.4 10*3/uL (ref 4.0–10.5)

## 2011-07-17 MED ORDER — ALUM & MAG HYDROXIDE-SIMETH 200-200-20 MG/5ML PO SUSP
15.0000 mL | Freq: Four times a day (QID) | ORAL | Status: DC | PRN
  Administered 2011-07-17: 15 mL via ORAL
  Filled 2011-07-17: qty 30

## 2011-07-17 MED ORDER — OXYCODONE HCL 5 MG PO TABS
5.0000 mg | ORAL_TABLET | ORAL | Status: DC | PRN
  Administered 2011-07-17 – 2011-07-18 (×8): 15 mg via ORAL
  Filled 2011-07-17 (×9): qty 3

## 2011-07-17 MED ORDER — GLIPIZIDE 10 MG PO TABS
10.0000 mg | ORAL_TABLET | Freq: Two times a day (BID) | ORAL | Status: DC
  Administered 2011-07-17 – 2011-07-21 (×8): 10 mg via ORAL
  Filled 2011-07-17 (×10): qty 1

## 2011-07-17 MED ORDER — DIAZEPAM 5 MG PO TABS
2.5000 mg | ORAL_TABLET | Freq: Three times a day (TID) | ORAL | Status: DC | PRN
  Administered 2011-07-18 – 2011-07-21 (×6): 5 mg via ORAL
  Filled 2011-07-17 (×6): qty 1

## 2011-07-17 MED ORDER — INSULIN GLARGINE 100 UNIT/ML ~~LOC~~ SOLN
50.0000 [IU] | Freq: Every day | SUBCUTANEOUS | Status: DC
  Administered 2011-07-17: 50 [IU] via SUBCUTANEOUS

## 2011-07-17 MED ORDER — POLYSACCHARIDE IRON COMPLEX 150 MG PO CAPS
150.0000 mg | ORAL_CAPSULE | Freq: Every day | ORAL | Status: DC
  Administered 2011-07-17 – 2011-07-21 (×5): 150 mg via ORAL
  Filled 2011-07-17 (×5): qty 1

## 2011-07-17 NOTE — Progress Notes (Signed)
   Subjective: 1 Day Post-Op Procedure(s) (LRB): CANNULATED HIP PINNING (Left) Patient reports pain as moderate and severe.   Patient seen in rounds with Dr. Wynelle Link. Patient is well, but has had some minor complaints of pain in the hip and leg and sore all over, requiring pain medications We will start therapy today.  Plan is to go Home after hospital stay.  Objective: Vital signs in last 24 hours: Temp:  [97.7 F (36.5 C)-98.5 F (36.9 C)] 98.4 F (36.9 C) (07/16 0434) Pulse Rate:  [69-85] 73  (07/16 0434) Resp:  [11-20] 18  (07/16 0434) BP: (93-145)/(60-82) 122/82 mmHg (07/16 0434) SpO2:  [92 %-100 %] 93 % (07/16 0830)  Intake/Output from previous day:  Intake/Output Summary (Last 24 hours) at 07/17/11 0845 Last data filed at 07/17/11 0649  Gross per 24 hour  Intake 4866.24 ml  Output   5775 ml  Net -908.76 ml    Intake/Output this shift:    Labs:  Basename 07/17/11 0355 07/16/11 0255  HGB 9.5* 11.7*    Basename 07/17/11 0355 07/16/11 0255  WBC 9.4 17.0*  RBC 3.54* 4.26  HCT 30.3* 36.1  PLT 302 409*    Basename 07/17/11 0355 07/16/11 0255  NA 134* 132*  K 3.9 4.6  CL 98 97  CO2 25 22  BUN 13 20  CREATININE 0.84 0.92  GLUCOSE 270* 202*  CALCIUM 8.1* 9.2    Basename 07/16/11 0255  LABPT --  INR 1.01    EXAM General - Patient is Alert, Appropriate and Oriented Extremity - Neurovascular intact Sensation intact distally Dorsiflexion/Plantar flexion intact Dressing - dressing C/D/I Motor Function - intact, moving foot and toes well on exam.    Past Medical History  Diagnosis Date  . Anxiety   . Depression   . Diabetes mellitus   . Hypertension   . Asthma   . HIV infection 2000    Assessment/Plan: 1 Day Post-Op Procedure(s) (LRB): CANNULATED HIP PINNING (Left) Principal Problem:  *Femoral neck fracture Active Problems:  HIV DISEASE  DIABETES MELLITUS, TYPE II  HYPERLIPIDEMIA  PERIPHERAL NEUROPATHY  HYPERTENSION  Tobacco use  disorder  Hip fracture, left   Advance diet Up with therapy Plan for discharge tomorrow Discharge home with home health  DVT Prophylaxis - Lovenox Weight Bearing As Tolerated left Leg Begin Therapy   PERKINS, ALEXZANDREW 07/17/2011, 8:45 AM

## 2011-07-17 NOTE — Evaluation (Addendum)
Physical Therapy Evaluation Patient Details Name: [name] MRN: [id number] DOB: [birth year] Today's Date: 07/17/2011 Time: [id number] PT Time Calculation (min): 34 min  PT Assessment / Plan / Recommendation Clinical Impression  pt is s/p left hip pinning and will benefit from PT to maximize independence for home setting with family assist    PT Assessment  Patient needs continued PT services    Follow Up Recommendations  Home health PT    Barriers to Discharge        Equipment Recommendations  None recommended by PT    Recommendations for Other Services     Frequency Min 6X/week    Precautions / Restrictions Precautions Precautions: Fall Precaution Comments: *HIV+ Restrictions LLE Weight Bearing: Weight bearing as tolerated   Pertinent Vitals/Pain       Mobility  Bed Mobility Bed Mobility: Not assessed Details for Bed Mobility Assistance: pt in chair Transfers Transfers: Sit to Stand;Stand to Sit Sit to Stand: 4: Min assist;From chair/3-in-1;With armrests Stand to Sit: 4: Min assist;To chair/3-in-1;With armrests Details for Transfer Assistance: cues for hand placement Ambulation/Gait Ambulation/Gait Assistance: 4: Min assist/mod assist Ambulation Distance (Feet): 12 Feet Assistive device: Rolling walker Ambulation/Gait Assistance Details: cues for sequence, use of UEs, RW distance from self Gait Pattern: Step-to pattern;Decreased stance time - left General Gait Details: UEs fatigued quickly    Exercises Total Joint Exercises Ankle Circles/Pumps: AROM;Both;10 reps   PT Diagnosis: Difficulty walking  PT Problem List: Decreased strength;Decreased range of motion;Decreased activity tolerance;Decreased balance;Decreased mobility;Decreased knowledge of use of DME PT Treatment Interventions: DME instruction;Gait training;Stair training;Functional mobility training;Therapeutic activities;Therapeutic exercise;Patient/family education   PT Goals Acute Rehab PT  Goals PT Goal Formulation: With patient Pt will go Supine/Side to Sit: with min assist PT Goal: Supine/Side to Sit - Progress: Goal set today Pt will go Sit to Stand: with supervision PT Goal: Sit to Stand - Progress: Goal set today Pt will Ambulate: 51 - 150 feet;with supervision;with rolling walker PT Goal: Ambulate - Progress: Goal set today Pt will Go Up / Down Stairs: 3-5 stairs;with min assist;with least restrictive assistive device PT Goal: Up/Down Stairs - Progress: Goal set today  Visit Information  Last PT Received On: 07/17/11 Assistance Needed: +1, +2 safety    Subjective Data  Subjective: i tripped over my cat Patient Stated Goal: return to independence   Prior [name] Lives With: Other (Comment) (mother; ex husband) Type of Home: House Home Access: Stairs to enter [employer] of Steps: 3 steps total Home Layout: One level Home Adaptive Equipment: Walker - rolling;Bedside commode/3-in-1 Prior Function Level of Independence: Independent Communication Communication: No difficulties    Cognition       Extremity/Trunk Assessment Right Upper Extremity Assessment RUE ROM/Strength/Tone: WFL for tasks assessed Left Upper Extremity Assessment LUE ROM/Strength/Tone: WFL for tasks assessed Right Lower Extremity Assessment RLE ROM/Strength/Tone: [facility] for tasks assessed Left Lower Extremity Assessment LLE ROM/Strength/Tone: Deficits;Due to pain LLE ROM/Strength/Tone Deficits: hip flexion 2+/5   Balance    End of Session PT - End of Session Activity Tolerance: Patient limited by fatigue;Patient limited by pain Patient left: in chair;with call bell/phone within reach Nurse Communication: Mobility status  GP     [facility] 07/17/2011, 12:03 PM

## 2011-07-17 NOTE — Op Note (Signed)
NAMEHUDSYN, TRAW             ACCOUNT NO.:  [id number]  MEDICAL RECORD NO.:  [id number]  LOCATION:  [facility]                         FACILITY:  [facility]  PHYSICIAN:  Gaynelle Arabian, M.D.    DATE OF BIRTH:  [birth year]  DATE OF PROCEDURE:  07/16/2011 DATE OF DISCHARGE:                              OPERATIVE REPORT   POSTOPERATIVE DIAGNOSIS:  Left femoral neck fracture.  POSTOPERATIVE DIAGNOSIS:  Left femoral neck fracture.  PROCEDURE:  In situ pinning, left hip.  SURGEON:  Gaynelle Arabian, M.D.  ASSISTANT:  None.  ANESTHESIA:  General.  ESTIMATED BLOOD LOSS:  Minimal.  DRAINS:  None.  COMPLICATIONS:  None.  CONDITION:  Stable to recovery.  BRIEF CLINICAL NOTE:  [name] is a 46 year old female, had a fall late last evening, sustaining a minimally displaced left femoral neck fracture.  She has intense pain in the left hip.  She has been cleared medically.  She presents for in situ pinning of the left femoral neck fracture.  PROCEDURE IN DETAIL:  After successful administration of general anesthetic, the patient was placed on the fracture table.  The left lower extremity in well-padded traction boot, right lower extremity in well-padded leg holder.  We did not need to apply traction as this was a nondisplaced fracture.  The C-arm spot AP and lateral confirmed that it did not displace during positioning on the bed.  Thigh was then prepped and draped in usual sterile fashion.  The guide pins for the [employer] cannulated screws.  One of those was passed over the top of the thigh down in the configuration, so that this is in the center of femoral head and neck on AP view.  This insert was a marker for incision was made over the lateral part of the femur.  About a 2-inch incision was then made at the appropriate place.  The skin was cut through the subcutaneous tissue to the fascia lata, which was incised in line with skin incision.  Guidepin was passed so that this is in the center  of femoral head and neck on the AP view.  Another one was placed inferior to this and slightly posterior and another one was placed superior to this and slightly posterior.  The lengths are 90, 90, and 100 respectively.  The screws were passed over the guidepin and tightened down to the lateral cortex of the femur with excellent purchase of the screws.  It effectively compressed the fracture site.  The guidepins were removed.  Fluoro spots taken AP, lateral, confirming excellent position of hardware.  The incision was then cleaned and dried, and the deep tissue was closed with interrupted 2-0 Vicryl, subcu with interrupted 2-0 Vicryl, subcuticular running 4-0 Monocryl.  A 30 mL of 0.25% Marcaine with epinephrine was injected into the subcutaneous tissues.  The incision was cleaned and dried.  Steri-Strips and sterile dressing applied.  She was awakened and transported to recovery in stable condition.     Gaynelle Arabian, M.D.     FA/MEDQ  D:  07/16/2011  T:  07/17/2011  Job:  [id number]

## 2011-07-17 NOTE — Progress Notes (Signed)
[facility] PROGRESS NOTE  TONEA FINTON [id number] DOB: 07/02/1965 DOA: 07/16/2011 PCP: No primary provider on file.  Assessment/Plan: Principal Problem:  *Femoral neck fracture Active Problems:  HIV DISEASE  DIABETES MELLITUS, TYPE II  HYPERLIPIDEMIA  PERIPHERAL NEUROPATHY  HYPERTENSION  Tobacco use disorder  Hip fracture, left  1. L hip fracture- s/p repair,  2. HIV disease- continue medications- well controlled 3. DM- SSI, restart glipizide, lantus may need to be increased back to home dose, continue to monitor 4. Tobacco use- encourage cessation 5. Home with home health in next few days   Code Status: full Family Communication: none Disposition Plan: home in next few days  Ecko Beasley, [facility] Pager [phone]. If 8PM-8AM, please contact night-coverage at [email or web address], password [facility] 07/17/2011, 12:14 PM  LOS: 1 day    Consultants:  orth  Procedures:  L hip repair  HPI/Subjective: Having pain Getting up to the bathroom Plans to go home after surgery   Objective: Filed Vitals:   07/17/11 0415 07/17/11 0434 07/17/11 0830 07/17/11 0929  BP:  122/82  114/65  Pulse:  73  77  Temp:  98.4 F (36.9 C)  99.3 F (37.4 C)  TempSrc:    Oral  Resp: 19 18  18   Height:      Weight:      SpO2: 96% 98% 93% 95%    Intake/Output Summary (Last 24 hours) at 07/17/11 1214 Last data filed at 07/17/11 1128  Gross per 24 hour  Intake 4926.24 ml  Output   5675 ml  Net -748.76 ml    Exam:   General:  A+Ox3, NAD  Cardiovascular: rrr  Respiratory: clear ant  Abdomen: +BS, obese  Ext: moves all 4 extremities    Data Reviewed: Basic Metabolic Panel:  Lab [id number] 07/16/11 0255  NA 134* 132*  K 3.9 4.6  CL 98 97  CO2 25 22  GLUCOSE 270* 202*  BUN 13 20  CREATININE 0.84 0.92  CALCIUM 8.1* 9.2  MG -- --  PHOS -- --   Liver Function Tests: No results found for this basename:  AST:5,ALT:5,ALKPHOS:5,BILITOT:5,PROT:5,ALBUMIN:5 in the last 168 hours No results found for this basename: LIPASE:5,AMYLASE:5 in the last 168 hours No results found for this basename: AMMONIA:5 in the last 168 hours CBC:  Lab 07/17/11 0355 07/16/11 0255  WBC 9.4 17.0*  NEUTROABS -- 14.3*  HGB 9.5* 11.7*  HCT 30.3* 36.1  MCV 85.6 84.7  PLT 302 409*   Cardiac Enzymes: No results found for this basename: CKTOTAL:5,CKMB:5,CKMBINDEX:5,TROPONINI:5 in the last 168 hours BNP (last 3 results) No results found for this basename: PROBNP:3 in the last 8760 hours CBG:  Lab 07/17/11 0711 07/16/11 2127 07/16/11 1754 07/16/11 1606 07/16/11 1310  GLUCAP 217* 303* 178* 117* 105*    Recent Results (from the past 240 hour(s))  SURGICAL PCR SCREEN     Status: Abnormal   Collection Time   07/16/11  2:17 PM      Component Value Range Status Comment   MRSA, PCR NEGATIVE  NEGATIVE Final    Staphylococcus aureus POSITIVE (*) NEGATIVE Final      Studies: Dg Hip Complete Left  07/16/2011  *RADIOLOGY REPORT*  Clinical Data: Severe left hip pain after fall.  LEFT HIP - COMPLETE 2+ VIEW  Comparison: None.  Findings: There is a transverse fracture of the left femoral neck with mild varus angulation of the fracture fragments.  No focal bone lesion is appreciated.  The pelvis and visualized portion  of the sacrum appears intact.  Degenerative changes in the lower lumbar spine and in both hips.  IMPRESSION: Fracture of the left femoral neck with mild varus angulation.  Original Report Authenticated By: Neale Burly, M.D.   Dg Hip Operative Left  07/16/2011  *RADIOLOGY REPORT*  Clinical Data: Left hip pinning.  OPERATIVE LEFT HIP  Comparison: 07/16/2011.  Findings: Two intraoperative C-arm views of the left hip submitted for review after surgery reveal three pins transfixing left femoral neck fracture.  IMPRESSION: Open reduction and internal fixation left femoral neck fracture.  Original Report Authenticated  By: Doug Sou, M.D.    Scheduled Meds:   . atenolol  25 mg Oral Daily  . budesonide-formoterol  2 puff Inhalation BID  . buPROPion  150 mg Oral Daily  . carbamazepine  600 mg Oral BID  .  ceFAZolin (ANCEF) IV  2 g Intravenous Once  .  ceFAZolin (ANCEF) IV  2 g Intravenous Q6H  . citalopram  20 mg Oral Daily  . docusate sodium  100 mg Oral BID  . efavirenz-emtricitabine-tenofovir  1 tablet Oral QHS  . enoxaparin (LOVENOX) injection  40 mg Subcutaneous Q24H  . fluconazole  100 mg Oral Daily  . gemfibrozil  600 mg Oral BID AC  . HYDROmorphone      . insulin aspart  0-20 Units Subcutaneous TID WC  . insulin aspart  0-5 Units Subcutaneous QHS  . insulin glargine  25 Units Subcutaneous QHS  . iron polysaccharides  150 mg Oral Daily  . pantoprazole  40 mg Oral Q0600  . DISCONTD: sodium chloride   Intravenous STAT  . DISCONTD: docusate sodium  100 mg Oral BID  . DISCONTD: heparin  5,000 Units Subcutaneous Q8H  . DISCONTD: insulin aspart  0-20 Units Subcutaneous Q4H   Continuous Infusions:   . dextrose 5 % and 0.9 % NaCl with KCl 20 mEq/L 100 mL/hr at 07/17/11 0507  . DISCONTD: lactated ringers 1,000 mL (07/16/11 1430)  . DISCONTD: lactated ringers      Principal Problem:  *Femoral neck fracture Active Problems:  HIV DISEASE  DIABETES MELLITUS, TYPE II  HYPERLIPIDEMIA  PERIPHERAL NEUROPATHY  HYPERTENSION  Tobacco use disorder  Hip fracture, left

## 2011-07-17 NOTE — Progress Notes (Signed)
07/17/11 1500  PT Visit Information  Last PT Received On 07/17/11  PT Time Calculation  PT Start Time 1542  PT Stop Time 1551  PT Time Calculation (min) 9 min  Subjective Data  Subjective i don't want to get up  Precautions  Precautions Fall  Restrictions  LLE Weight Bearing WBAT  Cognition  Overall Cognitive Status Appears within functional limits for tasks assessed/performed  Arousal/Alertness Awake/alert  Orientation Level Appears intact for tasks assessed  Behavior During Session [facility] for tasks performed  Bed Mobility  Details for Bed Mobility Assistance b   Total Joint Exercises  Ankle Circles/Pumps AROM;Both;10 reps  Quad Sets AROM;Left;10 reps  Short Arc [place];Left;10 reps  Heel Slides AAROM;Left;10 reps  Hip ABduction/ADduction AAROM;Left;10 reps  PT - End of Session  Activity Tolerance Patient tolerated treatment well  Patient left in bed;with call bell/phone within reach  PT - Assessment/Plan  Comments on Treatment Session progressing; pt family to bring in walker for PT to fit to pt  PT Plan Discharge plan remains appropriate;Frequency remains appropriate  PT Frequency Min 6X/week  Follow Up Recommendations Home health PT  Equipment Recommended None recommended by PT  Acute Rehab PT Goals  PT Goal Formulation With patient  Time For Goal Achievement 07/24/11  Potential to Achieve Goals Good  PT General Charges  $$ ACUTE PT VISIT 1 Procedure  PT Treatments  $Therapeutic Exercise 8-22 mins

## 2011-07-17 NOTE — Progress Notes (Signed)
Inpatient Diabetes Program Recommendations  AACE/ADA: New Consensus Statement on Inpatient Glycemic Control (2009)  Target Ranges:  Prepandial:   less than 140 mg/dL      Peak postprandial:   less than 180 mg/dL (1-2 hours)      Critically ill patients:  140 - 180 mg/dL   Inpatient Diabetes Program Recommendations Insulin - Basal: Increase Lantus to 50 units at HS (1/2 home dose) Note:  Patient is currently ordered 1/4 home dose Lantus.  Fasting CBG=217 this morning.  Thank you  Raoul Pitch [facility] Inpatient Diabetes Coordinator [phone]

## 2011-07-18 LAB — BASIC METABOLIC PANEL
BUN: 14 mg/dL (ref 6–23)
Chloride: 97 mEq/L (ref 96–112)
GFR calc Af Amer: 87 mL/min — ABNORMAL LOW (ref >90)
GFR calc non Af Amer: 75 mL/min — ABNORMAL LOW (ref >90)
Potassium: 4.2 mEq/L (ref 3.5–5.1)
Sodium: 133 mEq/L — ABNORMAL LOW (ref 135–145)

## 2011-07-18 LAB — GLUCOSE, CAPILLARY: Glucose-Capillary: 187 mg/dL — ABNORMAL HIGH (ref 70–99)

## 2011-07-18 LAB — CBC
HCT: 30.6 % — ABNORMAL LOW (ref 36.0–46.0)
MCHC: 31 g/dL (ref 30.0–36.0)
Platelets: 342 10*3/uL (ref 150–400)
RDW: 17 % — ABNORMAL HIGH (ref 11.5–15.5)
WBC: 8.3 10*3/uL (ref 4.0–10.5)

## 2011-07-18 MED ORDER — HYDROCHLOROTHIAZIDE 25 MG PO TABS
25.0000 mg | ORAL_TABLET | Freq: Every day | ORAL | Status: DC
  Administered 2011-07-18 – 2011-07-21 (×4): 25 mg via ORAL
  Filled 2011-07-18 (×4): qty 1

## 2011-07-18 MED ORDER — METFORMIN HCL 500 MG PO TABS
1000.0000 mg | ORAL_TABLET | Freq: Two times a day (BID) | ORAL | Status: DC
  Administered 2011-07-18 – 2011-07-21 (×7): 1000 mg via ORAL
  Filled 2011-07-18 (×9): qty 2

## 2011-07-18 MED ORDER — METFORMIN HCL 500 MG PO TABS: 1000.0000 mg | ORAL_TABLET | Freq: Two times a day (BID) | ORAL | Status: DC

## 2011-07-18 MED ORDER — ESTRADIOL 1 MG PO TABS
2.0000 mg | ORAL_TABLET | Freq: Every day | ORAL | Status: DC
  Administered 2011-07-18 – 2011-07-21 (×4): 2 mg via ORAL
  Filled 2011-07-18 (×4): qty 2

## 2011-07-18 MED ORDER — INSULIN GLARGINE 100 UNIT/ML ~~LOC~~ SOLN
50.0000 [IU] | Freq: Two times a day (BID) | SUBCUTANEOUS | Status: DC
  Administered 2011-07-18 – 2011-07-21 (×7): 50 [IU] via SUBCUTANEOUS

## 2011-07-18 MED ORDER — OXYCODONE HCL 5 MG PO TABS
5.0000 mg | ORAL_TABLET | ORAL | Status: DC | PRN
  Administered 2011-07-18 – 2011-07-21 (×19): 15 mg via ORAL
  Filled 2011-07-18 (×20): qty 3

## 2011-07-18 NOTE — Progress Notes (Signed)
[name] is a 46 y.o. female who tripped and broke her left femoral neck, and is now s/p pinning. I have seen and examined her at bed side and reviewed her chart. Appreciate ortho. [name] says she still feels weak and has yet to lift her left leg. She is not sure if she will be able to mange at home.  1. Fracture of left hip   2. Depressive disorder, not elsewhere classified   3. Human immunodeficiency virus (HIV) disease   4. Type II or unspecified type diabetes mellitus without mention of complication, not stated as uncontrolled   5. Candidiasis of mouth     Past Medical History  Diagnosis Date  . Anxiety   . Depression   . Diabetes mellitus   . Hypertension   . Asthma   . HIV infection 2000   Current Facility-Administered Medications  Medication Dose Route Frequency Provider Last Rate Last Dose  . acetaminophen (TYLENOL) tablet 650 mg  650 mg Oral Q6H PRN Gearlean Alf, MD       Or  . acetaminophen (TYLENOL) suppository 650 mg  650 mg Rectal Q6H PRN Gearlean Alf, MD      . albuterol (PROVENTIL) (5 MG/ML) 0.5% nebulizer solution 2.5 mg  2.5 mg Nebulization Q4H PRN Orvan Falconer, MD      . ALPRAZolam Duanne Moron) tablet 0.25 mg  0.25 mg Oral Q6H PRN Geradine Girt, DO   0.25 mg at 07/18/11 0118  . alum & mag hydroxide-simeth (MAALOX/MYLANTA) 200-200-20 MG/5ML suspension 15 mL  15 mL Oral Q6H PRN Geradine Girt, DO   15 mL at 07/17/11 1806  . atenolol (TENORMIN) tablet 25 mg  25 mg Oral Daily Orvan Falconer, MD   25 mg at 07/17/11 0948  . bisacodyl (DULCOLAX) suppository 10 mg  10 mg Rectal Daily PRN Gearlean Alf, MD      . budesonide-formoterol [facility]) 80-4.5 MCG/ACT inhaler 2 puff  2 puff Inhalation BID Orvan Falconer, MD   2 puff at 07/18/11 0759  . buPROPion (WELLBUTRIN XL) 24 hr tablet 150 mg  150 mg Oral Daily Orvan Falconer, MD   150 mg at 07/17/11 0949  . carbamazepine (TEGRETOL XR) 12 hr tablet 600 mg  600 mg Oral BID Orvan Falconer, MD   600 mg at 07/17/11 2141  . citalopram (CELEXA)  tablet 20 mg  20 mg Oral Daily Orvan Falconer, MD   20 mg at 07/17/11 0949  . diazepam (VALIUM) tablet 2.5-5 mg  2.5-5 mg Oral Q8H PRN Alexzandrew Perkins, PA   5 mg at 07/18/11 0805  . diazepam (VALIUM) tablet 7.5 mg  7.5 mg Oral QHS PRN Orvan Falconer, MD   5 mg at 07/17/11 2141  . docusate sodium (COLACE) capsule 100 mg  100 mg Oral BID Gearlean Alf, MD   100 mg at 07/18/11 0805  . efavirenz-emtricitabine-tenofovir (ATRIPLA) 600-200-300 MG per tablet 1 tablet  1 tablet Oral QHS Orvan Falconer, MD   1 tablet at 07/17/11 2141  . enoxaparin (LOVENOX) injection 40 mg  40 mg Subcutaneous Q24H Gearlean Alf, MD   40 mg at 07/17/11 0900  . fluconazole (DIFLUCAN) tablet 100 mg  100 mg Oral Daily Orvan Falconer, MD   100 mg at 07/17/11 0949  . gemfibrozil (LOPID) tablet 600 mg  600 mg Oral BID AC Orvan Falconer, MD   600 mg at 07/18/11 0805  . glipiZIDE (GLUCOTROL) tablet 10 mg  10 mg Oral BID AC Jessica  U Vann, DO   10 mg at 07/18/11 0805  . insulin aspart (novoLOG) injection 0-20 Units  0-20 Units Subcutaneous TID WC Ventura Bruns, PA   15 Units at 07/18/11 0806  . insulin aspart (novoLOG) injection 0-5 Units  0-5 Units Subcutaneous QHS Ventura Bruns, PA   3 Units at 07/17/11 2142  . insulin glargine (LANTUS) injection 50 Units  50 Units Subcutaneous QHS Geradine Girt, DO   50 Units at 07/17/11 2143  . iron polysaccharides (NIFEREX) capsule 150 mg  150 mg Oral Daily Alexzandrew Perkins, PA   150 mg at 07/17/11 1407  . menthol-cetylpyridinium (CEPACOL) lozenge 3 mg  1 lozenge Oral PRN Gearlean Alf, MD       Or  . phenol (CHLORASEPTIC) mouth spray 1 spray  1 spray Mouth/Throat PRN Gearlean Alf, MD      . metoCLOPramide (REGLAN) tablet 5-10 mg  5-10 mg Oral Q8H PRN Gearlean Alf, MD       Or  . metoCLOPramide (REGLAN) injection 5-10 mg  5-10 mg Intravenous Q8H PRN Gearlean Alf, MD      . morphine 2 MG/ML injection 1-2 mg  1-2 mg Intravenous Q1H PRN Gearlean Alf, MD   2 mg at 07/18/11 0446  . ondansetron  (ZOFRAN) tablet 4 mg  4 mg Oral Q6H PRN Gearlean Alf, MD       Or  . ondansetron [facility]) injection 4 mg  4 mg Intravenous Q6H PRN Gearlean Alf, MD      . oxyCODONE (Oxy IR/ROXICODONE) immediate release tablet 5-15 mg  5-15 mg Oral Q4H PRN Alexzandrew Perkins, PA   15 mg at 07/18/11 0525  . pantoprazole (PROTONIX) EC tablet 40 mg  40 mg Oral Q0600 Orvan Falconer, MD   40 mg at 07/18/11 [phone]  . polyethylene glycol (MIRALAX / GLYCOLAX) packet 17 g  17 g Oral Daily PRN Gearlean Alf, MD      . DISCONTD: dextrose 5 % and 0.9 % NaCl with KCl 20 mEq/L infusion   Intravenous Continuous Alexzandrew Perkins, PA 30 mL/hr at 07/17/11 1411    . DISCONTD: insulin glargine (LANTUS) injection 25 Units  25 Units Subcutaneous QHS Orvan Falconer, MD   25 Units at 07/16/11 2145   No Known Allergies Principal Problem:  *Femoral neck fracture Active Problems:  HIV DISEASE  DIABETES MELLITUS, TYPE II  HYPERLIPIDEMIA  PERIPHERAL NEUROPATHY  HYPERTENSION  Tobacco use disorder  Hip fracture, left   Vital signs in last 24 hours: Temp:  [97.5 F (36.4 C)-99.2 F (37.3 C)] 98.8 F (37.1 C) (07/17 0510) Pulse Rate:  [78-86] 86  (07/17 0510) Resp:  [18-22] 18  (07/17 0800) BP: (98-134)/(63-84) 134/84 mmHg (07/17 0510) SpO2:  [96 %-97 %] 97 % (07/17 0801) Weight change:  Last BM Date: 07/18/11  Intake/Output from previous day: 07/16 0701 - 07/17 0700 In: 2991.2 [P.O.:1780; I.V.:1211.2] Out: 5275 [Urine:5275] Intake/Output this shift: Total I/O In: 240 [P.O.:240] Out: 400 [Urine:400]  Lab Results:  Basename 07/18/11 0400 07/17/11 0355  WBC 8.3 9.4  HGB 9.5* 9.5*  HCT 30.6* 30.3*  PLT 342 302   BMET  Basename 07/18/11 0400 07/17/11 0355  NA 133* 134*  K 4.2 3.9  CL 97 98  CO2 24 25  GLUCOSE 351* 270*  BUN 14 13  CREATININE 0.90 0.84  CALCIUM 8.5 8.1*    Studies/Results: Dg Hip Operative Left  07/16/2011  *RADIOLOGY REPORT*  Clinical Data: Left hip  pinning.  OPERATIVE LEFT HIP   Comparison: 07/16/2011.  Findings: Two intraoperative C-arm views of the left hip submitted for review after surgery reveal three pins transfixing left femoral neck fracture.  IMPRESSION: Open reduction and internal fixation left femoral neck fracture.  Original Report Authenticated By: Doug Sou, M.D.    Medications: I have reviewed the patient's current medications.   Physical exam GENERAL- alert HEAD- normal atraumatic, no neck masses, normal thyroid, no jvd RESPIRATORY- appears well, vitals normal, no respiratory distress, acyanotic, normal RR, ear and throat exam is normal, neck free of mass or lymphadenopathy, chest clear, no wheezing, crepitations, rhonchi, normal symmetric air entry CVS- regular rate and rhythm, S1, S2 normal, no murmur, click, rub or gallop ABDOMEN- abdomen is soft without significant tenderness, masses, organomegaly or guarding NEURO- Grossly normal EXTREMITIES- no bleed post op left hip.  Plan   * Femoral neck fracture- s/p pinning. VContinue mx per ortho. Home with hhs versus short term rehab facility.  * Acute on chronic blood loss anemia post op- Hb 9.5. Unchanged. Monitor. Chronic medical problems- stable.  HIV DISEASE- continue home meds  DIABETES MELLITUS, TYPE II- uncontrolled. Resume metformin, change lantus to bid dosing as at home.  HYPERLIPIDEMIA  PERIPHERAL NEUROPATHY  HYPERTENSION- generally controlled. Resume hctz.  Tobacco use disorder  dvt prophylaxis.   Sherrilyn Nairn 07/18/2011 9:35 AM Pager: [phone].

## 2011-07-18 NOTE — Progress Notes (Signed)
Physical Therapy Treatment Patient Details Name: [name] MRN: [id number] DOB: 08-22-1965 Today's Date: 07/18/2011 Time: [id number] PT Time Calculation (min): 21 min  PT Assessment / Plan / Recommendation Comments on Treatment Session  Pt progressing slowly due to weak L hip requiring increased assist for transfers and ambulation.    Follow Up Recommendations  Home health PT;Supervision/Assistance - 24 hour    Barriers to Discharge        Equipment Recommendations  None recommended by PT    Recommendations for Other Services    Frequency     Plan Discharge plan remains appropriate;Frequency remains appropriate    Precautions / Restrictions Precautions Precautions: Fall Restrictions Weight Bearing Restrictions: No LLE Weight Bearing: Weight bearing as tolerated   Pertinent Vitals/Pain 8/10 L hip pain preactivity, reports increased to greater than 10/10 after ambulation,repositioned in supine, RN notified.    Mobility  Bed Mobility Bed Mobility: Supine to Sit;Sit to Supine Supine to Sit: 3: Mod assist;With rails;HOB elevated Sit to Supine: 3: Mod assist;With rail;HOB elevated Details for Bed Mobility Assistance: verbal cues for technique, assist for LEs and hand to pull trunk upright Transfers Transfers: Sit to Stand;Stand to Sit Sit to Stand: 4: Min assist;From elevated surface;With upper extremity assist;From bed Stand to Sit: 4: Min assist;With upper extremity assist;To bed;To elevated surface Details for Transfer Assistance: verbal cues for safe technique Ambulation/Gait Ambulation/Gait Assistance: 4: Min assist Ambulation Distance (Feet): 12 Feet Assistive device: Rolling walker Ambulation/Gait Assistance Details: pt only able to tolerate 12 feet again today, fatigues quickly, verbal cues for safe technique, pt c/o being unable to lift L foot off floor so slide/inch foot to advance, required increased time Gait Pattern: Step-to pattern;Trunk flexed      Exercises     PT Diagnosis:    PT Problem List:   PT Treatment Interventions:     PT Goals Acute Rehab PT Goals PT Goal: Supine/Side to Sit - Progress: Progressing toward goal PT Goal: Sit to Stand - Progress: Progressing toward goal PT Goal: Ambulate - Progress: Progressing toward goal  Visit Information  Last PT Received On: 07/18/11 Assistance Needed: +2    Subjective Data  Subjective: "my nurse said she'd be back with morphine shot."   Cognition  Overall Cognitive Status: Appears within functional limits for tasks assessed/performed    Balance     End of Session PT - End of Session Equipment Utilized During Treatment: Gait belt Activity Tolerance: Patient limited by fatigue;Patient limited by pain Patient left: in bed;with call bell/phone within reach Nurse Communication: Patient requests pain meds   GP     Turrell Severt,[name] 07/18/2011, 10:53 AM Pager: [phone]

## 2011-07-18 NOTE — Progress Notes (Signed)
Physical Therapy Treatment Patient Details Name: [name] MRN: [id number] DOB: 1965-03-09 Today's Date: 07/18/2011 Time: [id number] PT Time Calculation (min): 25 min  PT Assessment / Plan / Recommendation Comments on Treatment Session  P t is progressing slowly. Concern for DC to home w/ limited support. Pt states a WC is available but has steps to enter. No family present to discuss who is available. Recommend consult for  CIR? care manager aware of rec.    Follow Up Recommendations  Inpatient Rehab    Barriers to Discharge        Equipment Recommendations  Defer to next venue    Recommendations for Other Services    Frequency Min 6X/week   Plan Discharge plan needs to be updated;Frequency remains appropriate    Precautions / Restrictions Precautions Precautions: Fall   Pertinent Vitals/Pain 8/10 R hip, RN aware.    Mobility  Bed Mobility Bed Mobility: Supine to Sit;Sit to Supine Supine to Sit: 3: Mod assist;HOB elevated;With rails Sit to Supine: 3: Mod assist;HOB elevated;With rail Details for Bed Mobility Assistance: support of RLE on/off oof bed. HOB at 50%. much difficulty getting to upright. Transfers Transfers: Sit to Stand;Stand to Sit;Stand Pivot Transfers Sit to Stand: 3: Mod assist;From elevated surface;From bed;From chair/3-in-1 Stand to Sit: To chair/3-in-1;To bed;4: Min assist Stand Pivot Transfers: 4: Min assist Details for Transfer Assistance: VC  for safety and use of UE's to push fro surface on R.    Exercises Other Exercises Other Exercises: provided leg lifter to use for repositioning and heel slides   PT Diagnosis:    PT Problem List:   PT Treatment Interventions:     PT Goals Acute Rehab PT Goals Pt will go Supine/Side to Sit: with min assist PT Goal: Supine/Side to Sit - Progress: Progressing toward goal Pt will go Sit to Stand: with supervision PT Goal: Sit to Stand - Progress: Progressing toward goal Pt will Ambulate: 51 - 150  feet;with supervision PT Goal: Ambulate - Progress: Not progressing  Visit Information  Last PT Received On: 07/18/11 Assistance Needed: +1    Subjective Data  Subjective: I need to be able to move my leg   Cognition  Overall Cognitive Status: Appears within functional limits for tasks assessed/performed Arousal/Alertness: Awake/alert Orientation Level: Appears intact for tasks assessed Behavior During Session: [facility] for tasks performed    Balance     End of Session PT - End of Session Activity Tolerance: Patient limited by fatigue;Patient limited by pain Patient left: in bed;with call bell/phone within reach Nurse Communication: Patient requests pain meds   GP     Claretha Cooper 07/18/2011, 4:53 PM [phone]

## 2011-07-18 NOTE — Progress Notes (Addendum)
   Subjective: 2 Days Post-Op Procedure(s) (LRB): CANNULATED HIP PINNING (Left) Patient reports pain as mild.  She is doing better today but not walking well.  She needs more therapy and mobility.  Only walked 12 feet as of this morning. Patient seen in rounds with Dr. Wynelle Link. Patient is well, and has had no acute complaints or problems Plan is to go Home after hospital stay.  Objective: Vital signs in last 24 hours: Temp:  [97.5 F (36.4 C)-99.2 F (37.3 C)] 98.8 F (37.1 C) (07/17 0510) Pulse Rate:  [78-86] 86  (07/17 0510) Resp:  [18-22] 18  (07/17 0800) BP: (98-134)/(63-84) 134/84 mmHg (07/17 0510) SpO2:  [96 %-97 %] 97 % (07/17 0801)  Intake/Output from previous day:  Intake/Output Summary (Last 24 hours) at 07/18/11 1121 Last data filed at 07/18/11 0900  Gross per 24 hour  Intake 2812.83 ml  Output   5375 ml  Net -2562.17 ml    Intake/Output this shift: Total I/O In: 240 [P.O.:240] Out: 400 [Urine:400]  Labs:  [facility] 07/18/11 0400 07/17/11 0355 07/16/11 0255  HGB 9.5* 9.5* 11.7*    Basename 07/18/11 0400 07/17/11 0355  WBC 8.3 9.4  RBC 3.53* 3.54*  HCT 30.6* 30.3*  PLT 342 302    Basename 07/18/11 0400 07/17/11 0355  NA 133* 134*  K 4.2 3.9  CL 97 98  CO2 24 25  BUN 14 13  CREATININE 0.90 0.84  GLUCOSE 351* 270*  CALCIUM 8.5 8.1*    Basename 07/16/11 0255  LABPT --  INR 1.01    EXAM General - Patient is Alert, Appropriate and Oriented Extremity - Neurovascular intact Sensation intact distally Dorsiflexion/Plantar flexion intact Dressing/Incision - clean, dry, no drainage, healing Motor Function - intact, moving foot and toes well on exam.   Past Medical History  Diagnosis Date  . Anxiety   . Depression   . Diabetes mellitus   . Hypertension   . Asthma   . HIV infection 2000    Assessment/Plan: 2 Days Post-Op Procedure(s) (LRB): CANNULATED HIP PINNING (Left) Principal Problem:  *Femoral neck fracture Active Problems:  HIV  DISEASE  DIABETES MELLITUS, TYPE II  HYPERLIPIDEMIA  PERIPHERAL NEUROPATHY  HYPERTENSION  Tobacco use disorder  Hip fracture, left   Up with therapy Plan for discharge tomorrow Discharge home with home health  DVT Prophylaxis - Lovenox but will switch over to full dose aspirin at discharge. Weight Bearing As Tolerated left Leg  [name] 07/18/2011, 11:21 AM

## 2011-07-18 NOTE — Evaluation (Signed)
Occupational Therapy Evaluation Patient Details Name: [name] MRN: [id number] DOB: 07-12-1965 Today's Date: 07/18/2011 Time: [id number] OT Time Calculation (min): 37 min  OT Assessment / Plan / Recommendation Clinical Impression  This 46 year old female was admitted for left femoral fx which was surgically pinned.  She was independent with all adls PTA and will have her mother help her, but she states mother is disabled.  Pt is appropriate for skilled OT to reach a min guard level in acute.      OT Assessment  Patient needs continued OT Services    Follow Up Recommendations  Home health OT;Other (comment) (as long as she and mother can manage)    Barriers to Discharge      Equipment Recommendations  3 in 1 bedside comode (if pt cannot borrow, ? hospital bed)    Recommendations for Other Services    Frequency  Min 2X/week    Precautions / Restrictions Precautions Precautions: Fall Restrictions LLE Weight Bearing: Weight bearing as tolerated   Pertinent Vitals/Pain 8/10 L hip--premedicated; repositioned    ADL  Eating/Feeding: Simulated;Independent Where Assessed - Eating/Feeding: Chair Grooming: Simulated;Set up Where Assessed - Grooming: Unsupported sitting Upper Body Bathing: Simulated;Set up Where Assessed - Upper Body Bathing: Unsupported sitting Lower Body Bathing: Simulated;Moderate assistance Where Assessed - Lower Body Bathing: Supported sit to stand Upper Body Dressing: Simulated;Set up Where Assessed - Upper Body Dressing: Unsupported sitting Lower Body Dressing: Simulated;Maximal assistance Where Assessed - Lower Body Dressing: Supported sit to stand Toilet Transfer: Performed;Minimal assistance (extra time) Toilet Transfer Method: Arts development officer: Bedside commode Toileting - Clothing Manipulation and Hygiene: Performed;Set up Where Assessed - Toileting Clothing Manipulation and Hygiene: Sit on 3-in-1 or toilet Equipment Used:  Rolling walker Transfers/Ambulation Related to ADLs: Pt moves slowly.  Needs mod A for bed mobility ADL Comments: has reacher at home:  reviewed adl uses    OT Diagnosis: Generalized weakness  OT Problem List:   OT Treatment Interventions: Self-care/ADL training;Therapeutic activities;Patient/family education;DME and/or AE instruction   OT Goals Acute Rehab OT Goals OT Goal Formulation: With patient Time For Goal Achievement: 07/18/11 Potential to Achieve Goals: Good ADL Goals Pt Will Perform Lower Body Bathing: with min assist;Sit to stand from bed;with adaptive equipment ADL Goal: Lower Body Bathing - Progress: Goal set today Pt Will Perform Lower Body Dressing: with min assist;Sit to stand from chair;with adaptive equipment (pants) ADL Goal: Lower Body Dressing - Progress: Goal set today Pt Will Transfer to Toilet: with min assist;3-in-1;Stand pivot transfer (min guard) ADL Goal: Toilet Transfer - Progress: Goal set today Pt Will Perform Toileting - Clothing Manipulation: with min assist;Sitting on 3-in-1 or toilet (min guard ) ADL Goal: Toileting - Clothing Manipulation - Progress: Goal set today  Visit Information  Last OT Received On: 07/18/11 Assistance Needed: +1    Subjective Data  Subjective: My exhusband will help as needed Patient Stated Goal: go home--doesn't want SNF   Prior Functioning  Vision/Perception  Home Living Available Help at Discharge: Family (Mother who is disabled will help her) Biochemist, clinical: Standard Additional Comments: mother may have 3:1 commode Prior Function Level of Independence: Independent Communication Communication: No difficulties      Cognition  Overall Cognitive Status: Appears within functional limits for tasks assessed/performed Behavior During Session: [facility] for tasks performed    Extremity/Trunk Assessment Right Upper Extremity Assessment RUE ROM/Strength/Tone: [facility] for tasks assessed Left Upper Extremity Assessment LUE  ROM/Strength/Tone: [facility] for tasks assessed   Mobility Bed  Mobility Bed Mobility: Supine to Sit;Sit to Supine Supine to Sit: 3: Mod assist;With rails;HOB elevated Sit to Supine: 3: Mod assist;With rail;HOB elevated Details for Bed Mobility Assistance: verbal cues for technique, assist for LEs and hand to pull trunk upright Transfers Sit to Stand: 4: Min assist;From elevated surface;With upper extremity assist;From bed Stand to Sit: 4: Min assist;With upper extremity assist;To bed;To elevated surface Details for Transfer Assistance: verbal cues for safe technique   Exercise    Balance    End of Session OT - End of Session Activity Tolerance: Patient limited by pain Patient left: in bed;with call bell/phone within reach  [place] 07/18/2011, 1:10 PM Lesle Chris, OTR/L [phone] 07/18/2011

## 2011-07-19 LAB — COMPREHENSIVE METABOLIC PANEL
AST: 13 U/L (ref 0–37)
BUN: 17 mg/dL (ref 6–23)
CO2: 26 mEq/L (ref 19–32)
Chloride: 98 mEq/L (ref 96–112)
Creatinine, Ser: 0.95 mg/dL (ref 0.50–1.10)
GFR calc Af Amer: 81 mL/min — ABNORMAL LOW (ref >90)
GFR calc non Af Amer: 70 mL/min — ABNORMAL LOW (ref >90)
Glucose, Bld: 248 mg/dL — ABNORMAL HIGH (ref 70–99)
Total Bilirubin: 0.1 mg/dL — ABNORMAL LOW (ref 0.3–1.2)

## 2011-07-19 LAB — GLUCOSE, CAPILLARY

## 2011-07-19 LAB — CBC
HCT: 30.4 % — ABNORMAL LOW (ref 36.0–46.0)
MCV: 84.2 fL (ref 78.0–100.0)
RBC: 3.61 MIL/uL — ABNORMAL LOW (ref 3.87–5.11)
WBC: 9 10*3/uL (ref 4.0–10.5)

## 2011-07-19 NOTE — Progress Notes (Signed)
   Subjective: 3 Days Post-Op Procedure(s) (LRB): CANNULATED HIP PINNING (Left) Patient reports pain as mild and moderate.   Patient seen in rounds with Dr. Wynelle Link. She is doing a little better today but not progressing well with therapy.  She wants to go home but probably needs another day.  If she doesn't progress than possible SNF.   Patient is having problems with some pain and mobility. Plan is to go Home after hospital stay.  Objective: Vital signs in last 24 hours: Temp:  [97.7 F (36.5 C)-97.9 F (36.6 C)] 97.9 F (36.6 C) (07/18 0555) Pulse Rate:  [83-84] 84  (07/18 0555) Resp:  [16-18] 18  (07/18 0555) BP: (118-123)/(70-78) 118/78 mmHg (07/18 0555) SpO2:  [95 %-100 %] 99 % (07/18 0555)  Intake/Output from previous day:  Intake/Output Summary (Last 24 hours) at 07/19/11 0833 Last data filed at 07/19/11 0639  Gross per 24 hour  Intake   1680 ml  Output   1200 ml  Net    480 ml    Intake/Output this shift:    Labs:  Basename 07/19/11 0350 07/18/11 0400 07/17/11 0355  HGB 9.6* 9.5* 9.5*    Basename 07/19/11 0350 07/18/11 0400  WBC 9.0 8.3  RBC 3.61* 3.53*  HCT 30.4* 30.6*  PLT 350 342    Basename 07/19/11 0350 07/18/11 0400  NA 133* 133*  K 4.3 4.2  CL 98 97  CO2 26 24  BUN 17 14  CREATININE 0.95 0.90  GLUCOSE 248* 351*  CALCIUM 9.0 8.5   No results found for this basename: LABPT:2,INR:2 in the last 72 hours  EXAM General - Patient is Alert, Appropriate and Oriented Extremity - Neurovascular intact Sensation intact distally Dorsiflexion/Plantar flexion intact No cellulitis present Dressing/Incision - clean, dry, no drainage, healing Motor Function - intact, moving foot and toes well on exam.   Past Medical History  Diagnosis Date  . Anxiety   . Depression   . Diabetes mellitus   . Hypertension   . Asthma   . HIV infection 2000    Assessment/Plan: 3 Days Post-Op Procedure(s) (LRB): CANNULATED HIP PINNING (Left) Principal Problem:  *Femoral neck fracture Active Problems:  HIV DISEASE  DIABETES MELLITUS, TYPE II  HYPERLIPIDEMIA  PERIPHERAL NEUROPATHY  HYPERTENSION  Tobacco use disorder  Hip fracture, left   Up with therapy  DVT Prophylaxis - Lovenox but will switch over to full dose aspirin at discharge for four weeks.  Weight Bearing As Tolerated left Leg  Kirubel Aja 07/19/2011, 8:33 AM

## 2011-07-19 NOTE — Progress Notes (Signed)
SUBJECTIVE Says she still has a lot of pain left leg, but making slow progress.   1. Fracture of left hip   2. Depressive disorder, not elsewhere classified   3. Human immunodeficiency virus (HIV) disease   4. Type II or unspecified type diabetes mellitus without mention of complication, not stated as uncontrolled   5. Candidiasis of mouth     Past Medical History  Diagnosis Date  . Anxiety   . Depression   . Diabetes mellitus   . Hypertension   . Asthma   . HIV infection 2000   Current Facility-Administered Medications  Medication Dose Route Frequency Provider Last Rate Last Dose  . acetaminophen (TYLENOL) tablet 650 mg  650 mg Oral Q6H PRN Gearlean Alf, MD       Or  . acetaminophen (TYLENOL) suppository 650 mg  650 mg Rectal Q6H PRN Gearlean Alf, MD      . albuterol (PROVENTIL) (5 MG/ML) 0.5% nebulizer solution 2.5 mg  2.5 mg Nebulization Q4H PRN Orvan Falconer, MD      . ALPRAZolam Duanne Moron) tablet 0.25 mg  0.25 mg Oral Q6H PRN Geradine Girt, DO   0.25 mg at 07/18/11 0118  . alum & mag hydroxide-simeth (MAALOX/MYLANTA) 200-200-20 MG/5ML suspension 15 mL  15 mL Oral Q6H PRN Geradine Girt, DO   15 mL at 07/17/11 1806  . atenolol (TENORMIN) tablet 25 mg  25 mg Oral Daily Orvan Falconer, MD   25 mg at 07/18/11 1030  . bisacodyl (DULCOLAX) suppository 10 mg  10 mg Rectal Daily PRN Gearlean Alf, MD      . budesonide-formoterol [facility]) 80-4.5 MCG/ACT inhaler 2 puff  2 puff Inhalation BID Orvan Falconer, MD   2 puff at 07/19/11 0844  . buPROPion (WELLBUTRIN XL) 24 hr tablet 150 mg  150 mg Oral Daily Orvan Falconer, MD   150 mg at 07/18/11 1031  . carbamazepine (TEGRETOL XR) 12 hr tablet 600 mg  600 mg Oral BID Orvan Falconer, MD   600 mg at 07/18/11 2223  . citalopram (CELEXA) tablet 20 mg  20 mg Oral Daily Orvan Falconer, MD   20 mg at 07/18/11 1032  . diazepam (VALIUM) tablet 2.5-5 mg  2.5-5 mg Oral Q8H PRN Alexzandrew Perkins, PA   5 mg at 07/19/11 0536  . diazepam (VALIUM) tablet 7.5 mg  7.5 mg Oral QHS  PRN Orvan Falconer, MD   7.5 mg at 07/18/11 2045  . docusate sodium (COLACE) capsule 100 mg  100 mg Oral BID Gearlean Alf, MD   100 mg at 07/18/11 2223  . efavirenz-emtricitabine-tenofovir (ATRIPLA) 600-200-300 MG per tablet 1 tablet  1 tablet Oral QHS Orvan Falconer, MD   1 tablet at 07/18/11 2223  . enoxaparin (LOVENOX) injection 40 mg  40 mg Subcutaneous Q24H Gearlean Alf, MD   40 mg at 07/19/11 0830  . estradiol (ESTRACE) tablet 2 mg  2 mg Oral Daily Jamere Stidham, MD   2 mg at 07/18/11 1030  . fluconazole (DIFLUCAN) tablet 100 mg  100 mg Oral Daily Orvan Falconer, MD   100 mg at 07/18/11 1032  . gemfibrozil (LOPID) tablet 600 mg  600 mg Oral BID AC Orvan Falconer, MD   600 mg at 07/19/11 0830  . glipiZIDE (GLUCOTROL) tablet 10 mg  10 mg Oral BID AC Jessica U Vann, DO   10 mg at 07/19/11 0829  . hydrochlorothiazide (HYDRODIURIL) tablet 25 mg  25 mg Oral Daily Nat Math, MD  25 mg at 07/18/11 1032  . insulin aspart (novoLOG) injection 0-20 Units  0-20 Units Subcutaneous TID WC Ventura Bruns, PA   4 Units at 07/19/11 0829  . insulin aspart (novoLOG) injection 0-5 Units  0-5 Units Subcutaneous QHS Ventura Bruns, PA   3 Units at 07/17/11 2142  . insulin glargine (LANTUS) injection 50 Units  50 Units Subcutaneous BID Nat Math, MD   50 Units at 07/18/11 2222  . iron polysaccharides (NIFEREX) capsule 150 mg  150 mg Oral Daily Alexzandrew Perkins, PA   150 mg at 07/18/11 1032  . menthol-cetylpyridinium (CEPACOL) lozenge 3 mg  1 lozenge Oral PRN Gearlean Alf, MD       Or  . phenol (CHLORASEPTIC) mouth spray 1 spray  1 spray Mouth/Throat PRN Gearlean Alf, MD      . metFORMIN (GLUCOPHAGE) tablet 1,000 mg  1,000 mg Oral BID WC Vastie Douty, MD   1,000 mg at 07/18/11 1728  . metoCLOPramide (REGLAN) tablet 5-10 mg  5-10 mg Oral Q8H PRN Gearlean Alf, MD       Or  . metoCLOPramide (REGLAN) injection 5-10 mg  5-10 mg Intravenous Q8H PRN Gearlean Alf, MD      . morphine 2 MG/ML injection 1-2 mg  1-2 mg  Intravenous Q1H PRN Gearlean Alf, MD   2 mg at 07/18/11 0446  . ondansetron (ZOFRAN) tablet 4 mg  4 mg Oral Q6H PRN Gearlean Alf, MD       Or  . ondansetron [facility]) injection 4 mg  4 mg Intravenous Q6H PRN Gearlean Alf, MD      . oxyCODONE (Oxy IR/ROXICODONE) immediate release tablet 5-15 mg  5-15 mg Oral Q3H PRN Rhetta Mura Schorr, NP   15 mg at 07/19/11 0836  . pantoprazole (PROTONIX) EC tablet 40 mg  40 mg Oral Q0600 Orvan Falconer, MD   40 mg at 07/19/11 [phone]  . polyethylene glycol (MIRALAX / GLYCOLAX) packet 17 g  17 g Oral Daily PRN Gearlean Alf, MD      . DISCONTD: oxyCODONE (Oxy IR/ROXICODONE) immediate release tablet 5-15 mg  5-15 mg Oral Q4H PRN Alexzandrew Perkins, PA   15 mg at 07/18/11 1440   No Known Allergies Principal Problem:  *Femoral neck fracture Active Problems:  HIV DISEASE  DIABETES MELLITUS, TYPE II  HYPERLIPIDEMIA  PERIPHERAL NEUROPATHY  HYPERTENSION  Tobacco use disorder  Hip fracture, left   Vital signs in last 24 hours: Temp:  [97.7 F (36.5 C)-97.9 F (36.6 C)] 97.9 F (36.6 C) (07/18 0555) Pulse Rate:  [83-84] 84  (07/18 0555) Resp:  [16-18] 18  (07/18 0555) BP: (118-123)/(70-78) 118/78 mmHg (07/18 0555) SpO2:  [95 %-100 %] 98 % (07/18 0845) FiO2 (%):  [21 %] 21 % (07/18 0845) Weight change:  Last BM Date: 07/16/11  Intake/Output from previous day: 07/17 0701 - 07/18 0700 In: 1680 [P.O.:1440; I.V.:240] Out: 1200 [Urine:1200] Intake/Output this shift:    Lab Results:  Basename 07/19/11 0350 07/18/11 0400  WBC 9.0 8.3  HGB 9.6* 9.5*  HCT 30.4* 30.6*  PLT 350 342   BMET  Basename 07/19/11 0350 07/18/11 0400  NA 133* 133*  K 4.3 4.2  CL 98 97  CO2 26 24  GLUCOSE 248* 351*  BUN 17 14  CREATININE 0.95 0.90  CALCIUM 9.0 8.5    Studies/Results: No results found.  Medications: I have reviewed the patient's current medications.   Physical exam GENERAL- alert HEAD-  normal atraumatic, no neck masses, normal thyroid, no  jvd RESPIRATORY- appears well, vitals normal, no respiratory distress, acyanotic, normal RR, ear and throat exam is normal, neck free of mass or lymphadenopathy, chest clear, no wheezing, crepitations, rhonchi, normal symmetric air entry CVS- regular rate and rhythm, S1, S2 normal, no murmur, click, rub or gallop ABDOMEN- abdomen is soft without significant tenderness, masses, organomegaly or guarding NEURO- Grossly normal EXTREMITIES- tenderness to touch left leg.  Plan   * Femoral neck fracture- s/p pinning. Continue mx per ortho. Home with hhs ?tomorrow.  * Acute on chronic blood loss anemia post op- Hb  Unchanged. Monitor.  Chronic medical problems- stable.  HIV DISEASE- continue home meds  DIABETES MELLITUS, TYPE II- uncontrolled. Resume metformin, change lantus to bid dosing as at home.  HYPERLIPIDEMIA  PERIPHERAL NEUROPATHY  HYPERTENSION- generally controlled. Resume hctz.  Tobacco use disorder  dvt prophylaxis.     Alvis Edgell 07/19/2011 11:04 AM Pager: [phone].

## 2011-07-19 NOTE — Progress Notes (Signed)
Occupational Therapy Treatment Patient Details Name: LUVERN COMINS MRN: [id number] DOB: 25-Feb-1965 Today's Date: 07/19/2011 Time: 0733-0800    OT Assessment / Plan / Recommendation    Follow Up Recommendations  Home health OT       Equipment Recommendations  3 in 1 bedside comode       Frequency Min 2X/week   Plan Discharge plan remains appropriate    Precautions / Restrictions Precautions Precautions: Fall       ADL  Eating/Feeding: Performed;Set up Grooming: Performed;Wash/dry face Where Assessed - Grooming: Supported standing;Other (comment) (with walker) Lower Body Dressing: Performed;Maximal assistance;Other (comment) (will need a reacher.  mother has one she can likely borrow) Where Assessed - Lower Body Dressing: Supported sit to Lobbyist: Performed;Minimal assistance;Other (comment) (extra time needed) Toilet Transfer Method: Sit to stand;Stand pivot Toilet Transfer Equipment: Bedside commode Toileting - Clothing Manipulation and Hygiene: Performed;Minimal assistance Where Assessed - Best boy and Hygiene: Standing ADL Comments: Mom has a Secondary school teacher and 3 n 1. Pt will inquire if she can use moms reacher    OT Goals Acute Rehab OT Goals OT Goal Formulation: With patient ADL Goals ADL Goal: Lower Body Dressing - Progress: Progressing toward goals ADL Goal: Toilet Transfer - Progress: Progressing toward goals ADL Goal: Toileting - Clothing Manipulation - Progress: Progressing toward goals  Visit Information  Last OT Received On: 07/19/11    Subjective Data  Subjective: I just dont wanna go to a rehab center      Cognition  Overall Cognitive Status: Appears within functional limits for tasks assessed/performed Arousal/Alertness: Awake/alert Orientation Level: Appears intact for tasks assessed Behavior During Session: [facility] for tasks performed             End of Session OT - End of Session Activity Tolerance: Patient  tolerated treatment well Patient left: in chair  GO     Gulf, Thereasa Parkin 07/19/2011, 8:34 AM

## 2011-07-19 NOTE — Progress Notes (Signed)
Physical Therapy Treatment Note   07/19/11 1400  PT Visit Information  Last PT Received On 07/19/11  Assistance Needed +1  PT Time Calculation  PT Start Time 1416  PT Stop Time 1433  PT Time Calculation (min) 17 min  Subjective Data  Subjective I just had to pee so bad I couldn't wait.  (pt in bathroom without calling for assist)  Precautions  Precautions Fall  Restrictions  LLE Weight Bearing WBAT  Cognition  Overall Cognitive Status Appears within functional limits for tasks assessed/performed  Bed Mobility  Bed Mobility Sit to Supine  Sit to Supine 5: Supervision;HOB elevated  Details for Bed Mobility Assistance pt used blue assistive device to lift leg onto bed, verbal cues for safe technique, increased time  Transfers  Transfers Sit to Stand;Stand to Sit  Sit to Stand With upper extremity assist;4: Min guard;From chair/3-in-1  Stand to Sit 4: Min guard;With upper extremity assist;To bed  Details for Transfer Assistance verbal cue for safe technique  Ambulation/Gait  Ambulation/Gait Assistance 4: Min guard  Ambulation Distance (Feet) 10 Feet  Assistive device Rolling walker  Ambulation/Gait Assistance Details ambulated from toilet back to bed, pt able to pick up L foot to advance limb, increased time  Gait Pattern Step-to pattern;Decreased stance time - left;Trunk flexed  Total Joint Exercises  Quad Sets AROM;Left;10 reps  Heel Slides AAROM;Left;10 reps  Hip ABduction/ADduction AAROM;Left;10 reps  Straight Leg Raises AAROM;Left;10 reps  Other Exercises  Other Exercises pt used leg lifter for AA exercises  PT - End of Session  Activity Tolerance Patient tolerated treatment well  Patient left in bed;with call bell/phone within reach;with bed alarm set  PT - Assessment/Plan  Comments on Treatment Session Pt on toilet upon entering room and reports she got up alone.  Pt assisted back to bed and performed exercises.  PT Plan Frequency remains appropriate;Discharge plan  remains appropriate  Follow Up Recommendations Inpatient Rehab  Equipment Recommended 3 in 1 bedside comode;Other (comment) (if home hospital bed)  Acute Rehab PT Goals  PT Goal: Sit to Stand - Progress Progressing toward goal  PT Goal: Ambulate - Progress Progressing toward goal  PT General Charges  $$ ACUTE PT VISIT 1 Procedure  PT Treatments  $Therapeutic Exercise 8-22 mins    Carmelia Bake, PT Pager: [phone]

## 2011-07-19 NOTE — Progress Notes (Signed)
Physical Therapy Treatment Patient Details Name: [name] MRN: [id number] DOB: [birth year] Today's Date: 07/19/2011 Time: [id number] PT Time Calculation (min): 12 min  PT Assessment / Plan / Recommendation Comments on Treatment Session  Pt still progressing slowly and does not know who can assist her at home.  Pt states she is not going to rehab though.    Follow Up Recommendations  Inpatient Rehab    Barriers to Discharge        Equipment Recommendations  3 in 1 bedside comode;Other (comment) (if home hospital bed)    Recommendations for Other Services    Frequency     Plan Frequency remains appropriate;Discharge plan remains appropriate    Precautions / Restrictions Precautions Precautions: Fall Restrictions LLE Weight Bearing: Weight bearing as tolerated   Pertinent Vitals/Pain 5/10 L hip pain, ice applied, premedicated    Mobility  Bed Mobility Bed Mobility: Sit to Supine Sit to Supine: 5: Supervision;With rail;HOB elevated Details for Bed Mobility Assistance: pt used blue assistive device to lift leg onto bed, verbal cues for safe technique, increased time Transfers Transfers: Sit to Stand;Stand to Sit Sit to Stand: 4: Min assist;With upper extremity assist;From chair/3-in-1 Stand to Sit: 4: Min guard;With upper extremity assist;To bed Details for Transfer Assistance: verbal cues for safe technique, assist for weakness and steadying Ambulation/Gait Ambulation/Gait Assistance: 4: Min assist Ambulation Distance (Feet): 32 Feet Assistive device: Rolling walker Ambulation/Gait Assistance Details: pt continues to fatigue quickly however better able to scoot L foot to advance, verbal cues for safe technique and posture Gait Pattern: Step-to pattern;Decreased stance time - left;Trunk flexed    Exercises     PT Diagnosis:    PT Problem List:   PT Treatment Interventions:     PT Goals Acute Rehab PT Goals PT Goal: Sit to Stand - Progress: Progressing toward  goal PT Goal: Ambulate - Progress: Progressing toward goal  Visit Information  Last PT Received On: 07/19/11 Assistance Needed: +1    Subjective Data  Subjective: I can't sit in this chair.   Cognition  Overall Cognitive Status: Appears within functional limits for tasks assessed/performed Arousal/Alertness: Awake/alert Orientation Level: Appears intact for tasks assessed Behavior During Session: [facility] for tasks performed    Balance     End of Session PT - End of Session Equipment Utilized During Treatment: Gait belt Activity Tolerance: Patient limited by fatigue;Patient limited by pain Patient left: in bed;with call bell/phone within reach   GP     Devora Tortorella,[name] 07/19/2011, 10:50 AM Pager: [phone]

## 2011-07-19 NOTE — Progress Notes (Signed)
I received a call from PT recommending inpatient rehab.  However, after speaking with case manager, patient wants to go home with mom and husband providing care.  IF patient changes her mind, we would be happy to do an inpatient rehab consult.  [phone]

## 2011-07-19 NOTE — Progress Notes (Signed)
07/18/201LINDA [place] [phone] CM spoke with patient regarding d/c recommendations. She is decling inpatient rehab and SNf. Wants to go home where family and friends will be caregivers. CM will f/u

## 2011-07-20 LAB — GLUCOSE, CAPILLARY
Glucose-Capillary: 201 mg/dL — ABNORMAL HIGH (ref 70–99)
Glucose-Capillary: 106 mg/dL — ABNORMAL HIGH (ref 70–99)
Glucose-Capillary: 112 mg/dL — ABNORMAL HIGH (ref 70–99)

## 2011-07-20 MED ORDER — OXYCODONE HCL 5 MG PO TABS: 5.0000 mg | ORAL_TABLET | ORAL | Status: AC | PRN

## 2011-07-20 MED ORDER — DSS 100 MG PO CAPS: 100.0000 mg | ORAL_CAPSULE | Freq: Two times a day (BID) | ORAL | Status: DC

## 2011-07-20 MED ORDER — ASPIRIN EC 325 MG PO TBEC: 325.0000 mg | DELAYED_RELEASE_TABLET | Freq: Every day | ORAL | Status: AC

## 2011-07-20 MED ORDER — POLYSACCHARIDE IRON COMPLEX 150 MG PO CAPS: 150.0000 mg | ORAL_CAPSULE | Freq: Every day | ORAL | Status: DC

## 2011-07-20 MED ORDER — POLYETHYLENE GLYCOL 3350 17 G PO PACK: 17.0000 g | PACK | Freq: Every day | ORAL | Status: AC | PRN

## 2011-07-20 NOTE — Progress Notes (Signed)
Physical Therapy Treatment Patient Details Name: [name] MRN: [id number] DOB: 1965/04/29 Today's Date: 07/20/2011 Time: [id number] PT Time Calculation (min): 18 min  PT Assessment / Plan / Recommendation Comments on Treatment Session  Pt still with limited ambulation distance however min/guard assist.  Pt reports spouse will like to come see how PT has been assisting pt to help correctly at home.  Pt reports she does not have to do stairs to get into home and can use ramp.    Follow Up Recommendations  Home health PT    Barriers to Discharge        Equipment Recommendations  Other (comment) (may benefit from hospital bed)    Recommendations for Other Services    Frequency     Plan Frequency remains appropriate;Discharge plan remains appropriate    Precautions / Restrictions Precautions Precautions: Fall Restrictions Weight Bearing Restrictions: No LLE Weight Bearing: Weight bearing as tolerated   Pertinent Vitals/Pain 8/10 L hip pain, pt reports one hr until next pain meds dose, repositioned    Mobility  Bed Mobility Bed Mobility: Supine to Sit;Sit to Supine Supine to Sit: 6: Modified independent (Device/Increase time);HOB elevated;With rails Sit to Supine: 6: Modified independent (Device/Increase time);With rail Details for Bed Mobility Assistance: pt used leg lifter, rail, HOB elevated, increased time Transfers Transfers: Sit to Stand;Stand to Sit Sit to Stand: 5: Supervision;With upper extremity assist;From bed;From toilet Stand to Sit: 5: Supervision;With upper extremity assist;To chair/3-in-1;To toilet Details for Transfer Assistance: verbal cue for safe technique Ambulation/Gait Ambulation/Gait Assistance: 4: Min guard Ambulation Distance (Feet): 36 Feet Assistive device: Rolling walker Ambulation/Gait Assistance Details: pt ambulated to/from bathroom and then a little into hallway, verbal cues for heel strike Gait Pattern: Step-to pattern;Decreased stance  time - left;Trunk flexed    Exercises     PT Diagnosis:    PT Problem List:   PT Treatment Interventions:     PT Goals Acute Rehab PT Goals PT Goal: Supine/Side to Sit - Progress: Met PT Goal: Sit to Stand - Progress: Met PT Goal: Ambulate - Progress: Progressing toward goal PT Goal: Up/Down Stairs - Progress: Other (comment) (pt reports she can use ramp)  Visit Information  Last PT Received On: 07/20/11 Assistance Needed: +1    Subjective Data  Subjective: You can't get sleep here.   Cognition  Overall Cognitive Status: Appears within functional limits for tasks assessed/performed Behavior During Session: [facility] for tasks performed    Balance     End of Session PT - End of Session Activity Tolerance: Patient tolerated treatment well Patient left: in bed;with call bell/phone within reach   GP     Liora Myles,[name] 07/20/2011, 11:25 AM Pager: [phone]

## 2011-07-20 NOTE — Progress Notes (Addendum)
   Subjective: 4 Days Post-Op Procedure(s) (LRB): CANNULATED HIP PINNING (Left) Patient reports pain as mild.   Patient seen in rounds with Dr. Wynelle Link. Patient is well, and has had no acute complaints or problems. She is doing a little better today.   Plan is to go Home after hospital stay. From an orthopedic standpoint, she may be discharged at any point. Recommend switching over to full dose aspirin at discharge for four weeks.   Objective: Vital signs in last 24 hours: Temp:  [98.1 F (36.7 C)-98.3 F (36.8 C)] 98.1 F (36.7 C) (07/19 0538) Pulse Rate:  [73-85] 85  (07/19 1026) Resp:  [15-16] 15  (07/19 0538) BP: (91-120)/(62-79) 120/79 mmHg (07/19 1026) SpO2:  [94 %-98 %] 98 % (07/19 0930) FiO2 (%):  [21 %] 21 % (07/19 0930)  Intake/Output from previous day:  Intake/Output Summary (Last 24 hours) at 07/20/11 1209 Last data filed at 07/20/11 0915  Gross per 24 hour  Intake   1240 ml  Output   1800 ml  Net   -560 ml    Intake/Output this shift: Total I/O In: 240 [P.O.:240] Out: 600 [Urine:600]  Labs:  Basename 07/19/11 0350 07/18/11 0400  HGB 9.6* 9.5*    Basename 07/19/11 0350 07/18/11 0400  WBC 9.0 8.3  RBC 3.61* 3.53*  HCT 30.4* 30.6*  PLT 350 342    Basename 07/19/11 0350 07/18/11 0400  NA 133* 133*  K 4.3 4.2  CL 98 97  CO2 26 24  BUN 17 14  CREATININE 0.95 0.90  GLUCOSE 248* 351*  CALCIUM 9.0 8.5   No results found for this basename: LABPT:2,INR:2 in the last 72 hours  EXAM General - Patient is Alert, Appropriate and Oriented Extremity - Neurovascular intact Sensation intact distally Dressing/Incision - clean, dry, no drainage Motor Function - intact, moving foot and toes well on exam.   Past Medical History  Diagnosis Date  . Anxiety   . Depression   . Diabetes mellitus   . Hypertension   . Asthma   . HIV infection 2000    Assessment/Plan: 4 Days Post-Op Procedure(s) (LRB): CANNULATED HIP PINNING (Left) Principal Problem:  *Femoral neck fracture Active Problems:  HIV DISEASE  DIABETES MELLITUS, TYPE II  HYPERLIPIDEMIA  PERIPHERAL NEUROPATHY  HYPERTENSION  Tobacco use disorder  Hip fracture, left   Discharge home with home health  DVT Prophylaxis - switch over to full dose aspirin at discharge for four weeks. Weight Bearing As Tolerated left Leg Follow up with Dr. Wynelle Link in two weeks at the office.  Please have patient call for appointment. Office number [phone]. D/C Meds - Oxy IR and Valium  PERKINS, ALEXZANDREW 07/20/2011, 12:09 PM

## 2011-07-20 NOTE — Discharge Summary (Signed)
DISCHARGE SUMMARY  DRISHYA WETMORE  MR#: [id number]  DOB:10-19-1965  Date of Admission: 07/16/2011 Date of Discharge: 07/20/2011  Attending Physician:Zhoe Catania  Patient's PCP:No primary provider on file.  Consults:Treatment Team:  Gearlean Alf, MD  Discharge Diagnoses: Present on Admission:  .Hip fracture, left .HIV DISEASE .DIABETES MELLITUS, TYPE II .PERIPHERAL NEUROPATHY .HYPERTENSION .Tobacco use disorder .HYPERLIPIDEMIA .Femoral neck fracture  Hospital Course: Ms Marciano was admitted with a left femoral fracture after a fall. She is now s/p pinning by Dr Maureen Ralphs. She will d/c home with home PT in am. She is discharged in stable condition to follow ortho as scheduled, and continue full dose aspirin for a month per ortho.   Medication List  As of 07/20/2011 12:17 PM   TAKE these medications         albuterol (2.5 MG/3ML) 0.083% nebulizer solution   Commonly known as: PROVENTIL   Take 2.5 mg by nebulization every 6 (six) hours as needed.      aspirin EC 325 MG tablet   Take 1 tablet (325 mg total) by mouth daily.      atenolol 25 MG tablet   Commonly known as: TENORMIN   Take 25 mg by mouth daily.      budesonide-formoterol 80-4.5 MCG/ACT inhaler   Commonly known as: SYMBICORT   Inhale 2 puffs into the lungs 2 (two) times daily.      buPROPion 150 MG 24 hr tablet   Commonly known as: WELLBUTRIN XL   Take 150 mg by mouth daily.      carbamazepine 200 MG 12 hr tablet   Commonly known as: TEGRETOL XR   Take 600 mg by mouth 2 (two) times daily.      celecoxib 100 MG capsule   Commonly known as: CELEBREX   Take 100 mg by mouth 2 (two) times daily.      citalopram 20 MG tablet   Commonly known as: CELEXA   Take 20 mg by mouth daily.      diazepam 5 MG tablet   Commonly known as: VALIUM   Take 7.5 mg by mouth at bedtime as needed. 5 mg tab, 1/2 tab nightly      DSS 100 MG Caps   Take 100 mg by mouth 2 (two) times daily.     efavirenz-emtricitabine-tenofovir 600-200-300 MG per tablet   Commonly known as: ATRIPLA   Take 1 tablet by mouth at bedtime.      estradiol 2 MG tablet   Commonly known as: ESTRACE   Take 2 mg by mouth daily.      fluconazole 100 MG tablet   Commonly known as: DIFLUCAN   Take 100 mg by mouth daily.      gemfibrozil 600 MG tablet   Commonly known as: LOPID   Take 600 mg by mouth 2 (two) times daily before a meal. 1/2 hour before meals      glipiZIDE 10 MG tablet   Commonly known as: GLUCOTROL   Take 10 mg by mouth 2 (two) times daily before a meal.      hydrochlorothiazide 25 MG tablet   Commonly known as: HYDRODIURIL   Take 25 mg by mouth daily.      insulin aspart 100 UNIT/ML injection   Commonly known as: novoLOG   Per sliding scale (15 units before bedtime)      insulin glargine 100 UNIT/ML injection   Commonly known as: LANTUS   Inject 50 Units into the skin 2 (two) times daily.  iron polysaccharides 150 MG capsule   Commonly known as: NIFEREX   Take 1 capsule (150 mg total) by mouth daily.      metFORMIN 1000 MG tablet   Commonly known as: GLUCOPHAGE   Take 1,000 mg by mouth 2 (two) times daily with a meal.      oxyCODONE 5 MG immediate release tablet   Commonly known as: Oxy IR/ROXICODONE   Take 1-3 tablets (5-15 mg total) by mouth every 3 (three) hours as needed ((for MODERATE breakthrough pain)).      pantoprazole 40 MG tablet   Commonly known as: PROTONIX   Take 40 mg by mouth daily.      polyethylene glycol packet   Commonly known as: MIRALAX / GLYCOLAX   Take 17 g by mouth daily as needed.      traMADol 50 MG tablet   Commonly known as: ULTRAM   Take 50 mg by mouth every 8 (eight) hours as needed.             Day of Discharge BP 120/79  Pulse 85  Temp 98.1 F (36.7 C) (Oral)  Resp 15  Ht 5\' 11"  (1.803 m)  Wt 104.327 kg (230 lb)  BMI 32.08 kg/m2  SpO2 98%  LMP 11/16/2010  Physical Exam: comfortable at rest.  Results for  orders placed during the hospital encounter of 07/16/11 (from the past 24 hour(s))  GLUCOSE, CAPILLARY     Status: Abnormal   Collection Time   07/19/11  5:46 PM      Component Value Range   Glucose-Capillary 210 (*) 70 - 99 mg/dL  GLUCOSE, CAPILLARY     Status: Abnormal   Collection Time   07/19/11 10:38 PM      Component Value Range   Glucose-Capillary 236 (*) 70 - 99 mg/dL   Comment 1 Notify RN     Comment 2 Documented in Chart    GLUCOSE, CAPILLARY     Status: Abnormal   Collection Time   07/20/11  7:11 AM      Component Value Range   Glucose-Capillary 201 (*) 70 - 99 mg/dL   Comment 1 Notify RN     Comment 2 Documented in Chart    GLUCOSE, CAPILLARY     Status: Abnormal   Collection Time   07/20/11 11:59 AM      Component Value Range   Glucose-Capillary 106 (*) 70 - 99 mg/dL   Comment 1 Documented in Chart     Comment 2 Notify RN      Disposition: home in am.   Follow-up Appts: Discharge Orders    Future Appointments: Provider: Department: Dept Phone: Center:   07/30/2011 11:15 AM Campbell Riches, MD [facility] [id number] [facility]     Future Orders Please Complete By Expires   Diet - low sodium heart healthy      Increase activity slowly         Follow-up Information    Follow up with Gearlean Alf, MD. Schedule an appointment as soon as possible for a visit in 2 weeks.   Contact information:   [facility] [address], [place] [id number]           Time spent in discharge (includes decision making & examination of pt): 35 minutes  Signed: Kahner Yanik 07/20/2011, 12:17 PM

## 2011-07-20 NOTE — Progress Notes (Signed)
Occupational Therapy Treatment Patient Details Name: [name] MRN: [id number] DOB: [birth year] Today's Date: 07/20/2011 Time: [id number] OT Time Calculation (min): 16 min  OT Assessment / Plan / Recommendation Comments on Treatment Session Pt did not feel up to doing ADL this am nor need to use the bathroom.  She is tired from a busy day yesterday    Follow Up Recommendations  Home health OT    Barriers to Discharge       Equipment Recommendations   (Pt will borrow 3:1 and walker from Mom)    Recommendations for Other Services    Frequency Min 2X/week   Plan Discharge plan remains appropriate    Precautions / Restrictions Precautions Precautions: Fall Restrictions Weight Bearing Restrictions: No LLE Weight Bearing: Weight bearing as tolerated   Pertinent Vitals/Pain No c/o pain but feels very tired    ADL  Lower Body Bathing: Simulated (seated only--min A with long sponge) Lower Body Dressing: Performed;Set up (socks with sock aid; reviewed other AE.  seated only) Transfers/Ambulation Related to ADLs: performed bed mobility with supervision with leg lifter ADL Comments: Issued AE kit and reviewed with pt.  She had used reacher earlier and didn't feel she needed to review.  Practiced with sock aid today.  Demonstrated and educated on sponge and shoehorn.    OT Diagnosis:    OT Problem List:   OT Treatment Interventions:     OT Goals ADL Goals Pt Will Perform Lower Body Bathing: with min assist;Sit to stand from bed;with adaptive equipment ADL Goal: Lower Body Bathing - Progress: Progressing toward goals Pt Will Perform Lower Body Dressing: with min assist;Sit to stand from chair;with adaptive equipment ADL Goal: Lower Body Dressing - Progress: Progressing toward goals  Visit Information  Last OT Received On: 07/20/11 Assistance Needed: +1    Subjective Data      Prior Functioning       Cognition  Overall Cognitive Status: Appears within functional  limits for tasks assessed/performed Behavior During Session: [facility] for tasks performed    Mobility Bed Mobility Supine to Sit: 5: Supervision Sit to Supine: 5: Supervision Details for Bed Mobility Assistance: leg lifter   Exercises    Balance    End of Session OT - End of Session Activity Tolerance: Patient tolerated treatment well Patient left: in chair  GO     [name] 07/20/2011, 8:41 AM Lesle Chris, OTR/L [phone] 07/20/2011

## 2011-07-21 LAB — GLUCOSE, CAPILLARY
Glucose-Capillary: 219 mg/dL — ABNORMAL HIGH (ref 70–99)
Glucose-Capillary: 264 mg/dL — ABNORMAL HIGH (ref 70–99)

## 2011-07-21 NOTE — Progress Notes (Signed)
Orthopedics Progress Note  Subjective: Pt resting comfortably with mild pain in left hip and left knee but overall doing fairly well  Objective:  Filed Vitals:   07/21/11 0418  BP: 115/75  Pulse: 76  Temp: 97.9 F (36.6 C)  Resp: 16    General: Awake and alert  Musculoskeletal: left hip incision healing well, nv intact distally, no drainage or erythema Neurovascularly intact  Lab Results  Component Value Date   WBC 9.0 07/19/2011   HGB 9.6* 07/19/2011   HCT 30.4* 07/19/2011   MCV 84.2 07/19/2011   PLT 350 07/19/2011       Component Value Date/Time   NA 133* 07/19/2011 0350   K 4.3 07/19/2011 0350   CL 98 07/19/2011 0350   CO2 26 07/19/2011 0350   GLUCOSE 248* 07/19/2011 0350   BUN 17 07/19/2011 0350   CREATININE 0.95 07/19/2011 0350   CREATININE 1.37* 01/03/2011 1437   CALCIUM 9.0 07/19/2011 0350   GFRNONAA 70* 07/19/2011 0350   GFRAA 81* 07/19/2011 0350    Lab Results  Component Value Date   INR 1.01 07/16/2011    Assessment/Plan: POD # s/p Procedure(s):left  CANNULATED HIP PINNING  D/c planning per medical team Pain control Pt/ot  Remo Lipps R. Veverly Fells, MD 07/21/2011 6:45 AM

## 2011-07-21 NOTE — Progress Notes (Signed)
Discharged from floor via w/c, spouse with pt. No changes in assessment. Coltrane Tugwell   

## 2011-07-21 NOTE — Progress Notes (Signed)
Cm spoke with patient concerning dc planning. Per pt [facility] to provide [facility] services upon discharge. Per patient retrieves rx from [facility] on [address]. Per pt mother to assist in home care. DME for home use present at bedside during interview. No other needs stated.    Arlean Hopping [phone]

## 2011-07-21 NOTE — Progress Notes (Signed)
Physical Therapy Treatment Patient Details Name: [name] MRN: [id number] DOB: [birth year] Today's Date: 07/21/2011 Time: [id number] PT Time Calculation (min): 19 min  PT Assessment / Plan / Recommendation Comments on Treatment Session  Increased ambulation distance this session. Pain increased with activity from 9 at rest, to 10+ wtih activity. Pt states she has wheelchair and will use it to get up ramp to enter home.     Follow Up Recommendations  Home health PT    Barriers to Discharge        Equipment Recommendations   (may benefit from hospital bed)    Recommendations for Other Services    Frequency Min 6X/week   Plan Discharge plan remains appropriate    Precautions / Restrictions Precautions Precautions: Fall Restrictions Weight Bearing Restrictions: No LLE Weight Bearing: Weight bearing as tolerated   Pertinent Vitals/Pain 9/10 at rest; 10+ with activity/ambulation    Mobility  Bed Mobility Bed Mobility: Supine to Sit Supine to Sit: HOB elevated;With rails;6: Modified independent (Device/Increase time) Sit to Supine: 6: Modified independent (Device/Increase time);HOB flat;With rail Details for Bed Mobility Assistance: Pt uses leg lifter. Increased time.  Transfers Transfers: Sit to Stand;Stand to Sit Sit to Stand: 5: Supervision;From bed;From elevated surface Stand to Sit: 5: Supervision;With upper extremity assist;To bed Details for Transfer Assistance: VCs safety, hand placement. Pt tends to pull up on RW. Ambulation/Gait Ambulation/Gait Assistance: 4: Min guard Ambulation Distance (Feet): 90 Feet Assistive device: Rolling walker Ambulation/Gait Assistance Details: Slow gait speed. Pt beginning to use reciprocal pattern.  Gait Pattern: Step-through pattern;Decreased stance time - left;Trunk flexed    Exercises     PT Diagnosis:    PT Problem List:   PT Treatment Interventions:     PT Goals Acute Rehab PT Goals Pt will Ambulate: 51 - 150  feet;with supervision PT Goal: Ambulate - Progress: Progressing toward goal PT Goal: Up/Down Stairs - Progress: Discontinued (comment) (pt has ramp)  Visit Information  Last PT Received On: 07/21/11 Assistance Needed: +1    Subjective Data  Subjective: "Ask me what my pain level is now?" Patient Stated Goal: Home   Cognition  Overall Cognitive Status: Appears within functional limits for tasks assessed/performed Arousal/Alertness: Awake/alert Orientation Level: Appears intact for tasks assessed Behavior During Session: [facility] for tasks performed    Balance     End of Session PT - End of Session Activity Tolerance: Patient limited by pain Patient left: in bed;with call bell/phone within reach   GP     [facility] 07/21/2011, 9:56 AM [phone]

## 2011-07-26 ENCOUNTER — Telehealth: Payer: Self-pay | Admitting: Licensed Clinical Social Worker

## 2011-07-26 NOTE — Telephone Encounter (Signed)
Patient called only wanting to speak to Dr. Johnnye Sima about a letter to send to her lawyer about her disability case. She also states that she fell and broke her hip for FYI purposes only. I will forward this to Dr. Johnnye Sima along with a note with her phone number and message on it.

## 2011-07-30 ENCOUNTER — Ambulatory Visit (INDEPENDENT_AMBULATORY_CARE_PROVIDER_SITE_OTHER): Payer: Self-pay | Admitting: Infectious Diseases

## 2011-07-30 ENCOUNTER — Encounter: Payer: Self-pay | Admitting: Infectious Diseases

## 2011-07-30 VITALS — BP 120/80 | HR 71 | Temp 97.9°F | Ht 70.0 in | Wt 232.0 lb

## 2011-07-30 DIAGNOSIS — F17200 Nicotine dependence, unspecified, uncomplicated: Secondary | ICD-10-CM

## 2011-07-30 DIAGNOSIS — B20 Human immunodeficiency virus [HIV] disease: Secondary | ICD-10-CM

## 2011-07-30 DIAGNOSIS — R296 Repeated falls: Secondary | ICD-10-CM

## 2011-07-30 DIAGNOSIS — Z79899 Other long term (current) drug therapy: Secondary | ICD-10-CM

## 2011-07-30 DIAGNOSIS — S72009A Fracture of unspecified part of neck of unspecified femur, initial encounter for closed fracture: Secondary | ICD-10-CM

## 2011-07-30 DIAGNOSIS — R29818 Other symptoms and signs involving the nervous system: Secondary | ICD-10-CM

## 2011-07-30 DIAGNOSIS — S72002A Fracture of unspecified part of neck of left femur, initial encounter for closed fracture: Secondary | ICD-10-CM

## 2011-07-30 DIAGNOSIS — E119 Type 2 diabetes mellitus without complications: Secondary | ICD-10-CM

## 2011-07-30 HISTORY — DX: Repeated falls: R29.6

## 2011-07-30 LAB — LIPID PANEL
Cholesterol: 258 mg/dL — ABNORMAL HIGH (ref 0–200)
HDL: 43 mg/dL (ref >39)
Total CHOL/HDL Ratio: 6 Ratio
VLDL: 49 mg/dL — ABNORMAL HIGH (ref 0–40)

## 2011-07-30 LAB — COMPLETE METABOLIC PANEL WITH GFR
AST: 17 U/L (ref 0–37)
Albumin: 4.2 g/dL (ref 3.5–5.2)
Alkaline Phosphatase: 139 U/L — ABNORMAL HIGH (ref 39–117)
GFR, Est Non African American: 49 mL/min — ABNORMAL LOW
Glucose, Bld: 156 mg/dL — ABNORMAL HIGH (ref 70–99)
Potassium: 4.7 mEq/L (ref 3.5–5.3)
Sodium: 136 mEq/L (ref 135–145)
Total Bilirubin: 0.2 mg/dL — ABNORMAL LOW (ref 0.3–1.2)
Total Protein: 7.3 g/dL (ref 6.0–8.3)

## 2011-07-30 LAB — CBC
Hemoglobin: 11.6 g/dL — ABNORMAL LOW (ref 12.0–15.0)
MCH: 26.7 pg (ref 26.0–34.0)
MCHC: 33.1 g/dL (ref 30.0–36.0)
RDW: 16.6 % — ABNORMAL HIGH (ref 11.5–15.5)

## 2011-07-30 NOTE — Addendum Note (Signed)
Addended by: Dolan Amen D on: 07/30/2011 12:52 PM   Modules accepted: Orders

## 2011-07-30 NOTE — Assessment & Plan Note (Signed)
She's doing very well. Refused condoms (clearly doesn't need after hip fx). Will cont to watch her Cd4 and VL. A new letter is written for her DA process stating that she can attend online classes. rtc  4-5 months.

## 2011-07-30 NOTE — Assessment & Plan Note (Signed)
Her wound is healing well (partially undressed, upper 1/2). Greatly appreciate Dr Allusio's help.

## 2011-07-30 NOTE — Progress Notes (Signed)
  Subjective:    Patient ID: [name], female    DOB: 1965/04/16, 46 y.o.   MRN: [id number]  HPI 46 yo F with HIV+ since 2009, DM2 with retinopathy + neuropathy, hyperlipidemia, obesity. Also with mild Cr elevation. Taking atripla (only rx).  Trying to get disability based on her HIV, neuropathy (has constant pain in her hands), DM, asthma. Was in hospital 7-15 to 7-20 with L hip fracture and pinning and screws. Broke hip after losing her balance trying to avoid her cat, take care of dogs.  States she has been falling more, been losing her balance. Doesn't "feel funny, I don't feel drunk". Doesn't feel room spinning. Her feet are just about completely numb.    HIV 1 RNA Quant (copies/mL)  Date Value  01/03/2011 70*  10/17/2010 273*  07/17/2010 <20      CD4 T Cell Abs (cmm)  Date Value  01/03/2011 380*  10/17/2010 340*  07/17/2010 460    Having difficulty concentrating.  FSGs have been avg 125-130.   Review of Systems Has set up her own PT program since she does not have medicare/medicaid. About 1 hour/day. Back up to 1/2 ppd. Still has some pain. Has upper chest pain from using walker. No SOB. No cough. No palpations. Has not had PAP this year.      Objective:   Physical Exam        Assessment & Plan:

## 2011-07-30 NOTE — Assessment & Plan Note (Signed)
Will defer her w/u to Dr Tomma Lightning. Not clear if this is related to her neuropathy.

## 2011-07-30 NOTE — Assessment & Plan Note (Signed)
Encouraged to quit. 

## 2011-07-30 NOTE — Assessment & Plan Note (Addendum)
Appears to be well controlled. Her feet are clean. She has f/u with Dr Tomma Lightning, greatly appreciate her partnering with [place].

## 2011-08-01 LAB — HIV-1 RNA QUANT-NO REFLEX-BLD
HIV 1 RNA Quant: <20 copies/mL (ref <20)
HIV-1 RNA Quant, Log: <1.3 {Log} (ref <1.30)

## 2011-08-03 ENCOUNTER — Telehealth: Payer: Self-pay | Admitting: *Deleted

## 2011-08-03 ENCOUNTER — Ambulatory Visit: Payer: Self-pay

## 2011-08-03 NOTE — Telephone Encounter (Signed)
Pt receiving PAP smear at GYN office.  Requested pt to have the most recent PAP results faxed to Dr. Johnnye Sima.  Pt verbalized that she would call her GYN MD for these results.

## 2011-08-09 ENCOUNTER — Telehealth: Payer: Self-pay | Admitting: Infectious Diseases

## 2011-08-09 NOTE — Telephone Encounter (Signed)
Received v/m from Ms Brackin wanting to know if we will take over her medications through the Patient Assistance Program again since [facility] has closed down.  I left her a v/m stating I will have to get approval from [facility] or Dr. Johnnye Sima and I will call her back as soon as I know something.  I spoke with Roselyn Reef and she will talk with Dr. Johnnye Sima tomorrow and let me know.

## 2011-08-23 ENCOUNTER — Other Ambulatory Visit: Payer: Self-pay | Admitting: Infectious Diseases

## 2011-09-18 ENCOUNTER — Other Ambulatory Visit: Payer: Self-pay | Admitting: Physician Assistant

## 2011-10-02 ENCOUNTER — Other Ambulatory Visit: Payer: Self-pay | Admitting: Licensed Clinical Social Worker

## 2011-10-02 DIAGNOSIS — B20 Human immunodeficiency virus [HIV] disease: Secondary | ICD-10-CM

## 2011-10-02 MED ORDER — EFAVIRENZ-EMTRICITAB-TENOFOVIR 600-200-300 MG PO TABS: 1.0000 | ORAL_TABLET | Freq: Every day | ORAL | Status: DC

## 2011-10-04 ENCOUNTER — Ambulatory Visit: Payer: Self-pay

## 2011-10-05 ENCOUNTER — Other Ambulatory Visit: Payer: Self-pay | Admitting: *Deleted

## 2011-11-27 ENCOUNTER — Other Ambulatory Visit: Payer: Self-pay | Admitting: Infectious Diseases

## 2011-12-11 ENCOUNTER — Other Ambulatory Visit: Payer: Self-pay | Admitting: Physician Assistant

## 2011-12-20 ENCOUNTER — Telehealth: Payer: Self-pay

## 2011-12-20 NOTE — Telephone Encounter (Signed)
[name] called back and left another message she is returing our call about her estradiol.  Called [name] and left a  Message we are returning her call, sorry we keep missing each other- instructed her to call back and ask to speak to a nurse since we keep missing each other

## 2011-12-20 NOTE — Telephone Encounter (Signed)
Pt called and stated that she needs a refill on her estradiol sent to [facility] on [address].. Called pt and left message to return the call to the clinics.  Re: Pt needs to schedule an appt to be seen for an annual/visit before estradiol to be refilled.  Pt has not been seen since 09/24/10.

## 2011-12-21 ENCOUNTER — Other Ambulatory Visit: Payer: Self-pay | Admitting: Obstetrics & Gynecology

## 2011-12-21 DIAGNOSIS — Z780 Asymptomatic menopausal state: Secondary | ICD-10-CM

## 2011-12-21 MED ORDER — ESTRADIOL 2 MG PO TABS: 2.0000 mg | ORAL_TABLET | Freq: Every day | ORAL | Status: DC

## 2011-12-21 MED ORDER — MEDROXYPROGESTERONE ACETATE 2.5 MG PO TABS: 2.5000 mg | ORAL_TABLET | Freq: Every day | ORAL | Status: DC

## 2011-12-21 NOTE — Telephone Encounter (Signed)
Pt called the front desk and asked if she could get a refill on her estradiol so that she would be able to be covered til her 01/04/11 appt.  Per Dr. Ihor Dow pt can have refill of estradiol if she will also take provera along with it.  Pt stated "ok, whatever I have to do".  Informed pt to give pharmacy a couple of hours to medication ready.  Pt stated understanding and did not have any other questions.

## 2011-12-28 ENCOUNTER — Encounter (HOSPITAL_COMMUNITY): Payer: Self-pay | Admitting: *Deleted

## 2011-12-28 ENCOUNTER — Emergency Department (HOSPITAL_COMMUNITY): Payer: Self-pay

## 2011-12-28 ENCOUNTER — Inpatient Hospital Stay (HOSPITAL_COMMUNITY)
Admission: EM | Admit: 2011-12-28 | Discharge: 2012-01-01 | DRG: 977 | Disposition: A | Discharge status: Home / Self Care | Payer: No Typology Code available for payment source | Attending: Internal Medicine | Admitting: Internal Medicine

## 2011-12-28 DIAGNOSIS — R197 Diarrhea, unspecified: Secondary | ICD-10-CM | POA: Diagnosis present

## 2011-12-28 DIAGNOSIS — F411 Generalized anxiety disorder: Secondary | ICD-10-CM | POA: Diagnosis present

## 2011-12-28 DIAGNOSIS — Z6835 Body mass index (BMI) 35.0-35.9, adult: Secondary | ICD-10-CM

## 2011-12-28 DIAGNOSIS — Z720 Tobacco use: Secondary | ICD-10-CM | POA: Diagnosis present

## 2011-12-28 DIAGNOSIS — R6889 Other general symptoms and signs: Secondary | ICD-10-CM

## 2011-12-28 DIAGNOSIS — G8929 Other chronic pain: Secondary | ICD-10-CM | POA: Diagnosis present

## 2011-12-28 DIAGNOSIS — Z79899 Other long term (current) drug therapy: Secondary | ICD-10-CM

## 2011-12-28 DIAGNOSIS — J40 Bronchitis, not specified as acute or chronic: Secondary | ICD-10-CM | POA: Diagnosis present

## 2011-12-28 DIAGNOSIS — J45901 Unspecified asthma with (acute) exacerbation: Secondary | ICD-10-CM

## 2011-12-28 DIAGNOSIS — I10 Essential (primary) hypertension: Secondary | ICD-10-CM | POA: Diagnosis present

## 2011-12-28 DIAGNOSIS — E118 Type 2 diabetes mellitus with unspecified complications: Secondary | ICD-10-CM | POA: Diagnosis present

## 2011-12-28 DIAGNOSIS — J101 Influenza due to other identified influenza virus with other respiratory manifestations: Secondary | ICD-10-CM

## 2011-12-28 DIAGNOSIS — M25559 Pain in unspecified hip: Secondary | ICD-10-CM | POA: Diagnosis present

## 2011-12-28 DIAGNOSIS — Z794 Long term (current) use of insulin: Secondary | ICD-10-CM

## 2011-12-28 DIAGNOSIS — IMO0002 Reserved for concepts with insufficient information to code with codable children: Secondary | ICD-10-CM | POA: Diagnosis present

## 2011-12-28 DIAGNOSIS — F3289 Other specified depressive episodes: Secondary | ICD-10-CM | POA: Diagnosis present

## 2011-12-28 DIAGNOSIS — E785 Hyperlipidemia, unspecified: Secondary | ICD-10-CM | POA: Diagnosis present

## 2011-12-28 DIAGNOSIS — F329 Major depressive disorder, single episode, unspecified: Secondary | ICD-10-CM | POA: Diagnosis present

## 2011-12-28 DIAGNOSIS — E1159 Type 2 diabetes mellitus with other circulatory complications: Secondary | ICD-10-CM | POA: Diagnosis present

## 2011-12-28 DIAGNOSIS — E1169 Type 2 diabetes mellitus with other specified complication: Secondary | ICD-10-CM | POA: Diagnosis present

## 2011-12-28 DIAGNOSIS — E871 Hypo-osmolality and hyponatremia: Secondary | ICD-10-CM | POA: Diagnosis present

## 2011-12-28 DIAGNOSIS — F17200 Nicotine dependence, unspecified, uncomplicated: Secondary | ICD-10-CM | POA: Diagnosis present

## 2011-12-28 DIAGNOSIS — B20 Human immunodeficiency virus [HIV] disease: Principal | ICD-10-CM | POA: Diagnosis present

## 2011-12-28 DIAGNOSIS — E1165 Type 2 diabetes mellitus with hyperglycemia: Secondary | ICD-10-CM | POA: Diagnosis present

## 2011-12-28 DIAGNOSIS — E119 Type 2 diabetes mellitus without complications: Secondary | ICD-10-CM | POA: Diagnosis present

## 2011-12-28 DIAGNOSIS — J111 Influenza due to unidentified influenza virus with other respiratory manifestations: Secondary | ICD-10-CM | POA: Diagnosis present

## 2011-12-28 DIAGNOSIS — F39 Unspecified mood [affective] disorder: Secondary | ICD-10-CM | POA: Diagnosis present

## 2011-12-28 DIAGNOSIS — J209 Acute bronchitis, unspecified: Secondary | ICD-10-CM

## 2011-12-28 DIAGNOSIS — E669 Obesity, unspecified: Secondary | ICD-10-CM | POA: Diagnosis present

## 2011-12-28 HISTORY — DX: Bronchitis, not specified as acute or chronic: J40

## 2011-12-28 HISTORY — DX: Unspecified asthma with (acute) exacerbation: J45.901

## 2011-12-28 LAB — BASIC METABOLIC PANEL
CO2: 27 mEq/L (ref 19–32)
Calcium: 9.7 mg/dL (ref 8.4–10.5)
Chloride: 84 mEq/L — ABNORMAL LOW (ref 96–112)
Creatinine, Ser: 0.76 mg/dL (ref 0.50–1.10)
GFR calc Af Amer: >90 mL/min (ref >90)
Sodium: 123 mEq/L — ABNORMAL LOW (ref 135–145)

## 2011-12-28 LAB — T-HELPER CELLS (CD4) COUNT (NOT AT ARMC)
CD4 % Helper T Cell: 13 % — ABNORMAL LOW (ref 33–55)
CD4 T Cell Abs: 30 uL — ABNORMAL LOW (ref 400–2700)

## 2011-12-28 LAB — CBC
MCH: 26.9 pg (ref 26.0–34.0)
MCV: 83.7 fL (ref 78.0–100.0)
Platelets: 444 10*3/uL — ABNORMAL HIGH (ref 150–400)
RDW: 16.4 % — ABNORMAL HIGH (ref 11.5–15.5)

## 2011-12-28 LAB — GLUCOSE, CAPILLARY
Glucose-Capillary: 256 mg/dL — ABNORMAL HIGH (ref 70–99)
Glucose-Capillary: 201 mg/dL — ABNORMAL HIGH (ref 70–99)

## 2011-12-28 LAB — CBC WITH DIFFERENTIAL/PLATELET
Basophils Absolute: 0.1 10*3/uL (ref 0.0–0.1)
Basophils Relative: 1 % (ref 0–1)
Eosinophils Relative: 1 % (ref 0–5)
Lymphocytes Relative: 6 % — ABNORMAL LOW (ref 12–46)
MCHC: 33.2 g/dL (ref 30.0–36.0)
Neutro Abs: 11.1 10*3/uL — ABNORMAL HIGH (ref 1.7–7.7)
Platelets: 437 10*3/uL — ABNORMAL HIGH (ref 150–400)
RDW: 16.2 % — ABNORMAL HIGH (ref 11.5–15.5)
WBC: 13.1 10*3/uL — ABNORMAL HIGH (ref 4.0–10.5)

## 2011-12-28 MED ORDER — METHYLPREDNISOLONE SODIUM SUCC 40 MG IJ SOLR
40.0000 mg | Freq: Four times a day (QID) | INTRAMUSCULAR | Status: DC
  Administered 2011-12-28: 40 mg via INTRAVENOUS
  Filled 2011-12-28 (×4): qty 1

## 2011-12-28 MED ORDER — DEXTROSE 5 % IV SOLN
1.0000 g | Freq: Once | INTRAVENOUS | Status: AC
  Administered 2011-12-28: 04:00:00 via INTRAVENOUS
  Filled 2011-12-28: qty 10

## 2011-12-28 MED ORDER — TRAMADOL HCL 50 MG PO TABS: 50.0000 mg | ORAL_TABLET | Freq: Three times a day (TID) | ORAL | Status: DC | PRN

## 2011-12-28 MED ORDER — ONDANSETRON HCL 4 MG/2ML IJ SOLN
4.0000 mg | Freq: Once | INTRAMUSCULAR | Status: AC
  Administered 2011-12-28: 4 mg via INTRAVENOUS
  Filled 2011-12-28: qty 2

## 2011-12-28 MED ORDER — MORPHINE SULFATE 4 MG/ML IJ SOLN
4.0000 mg | Freq: Once | INTRAMUSCULAR | Status: AC
  Administered 2011-12-28: 4 mg via INTRAVENOUS
  Filled 2011-12-28: qty 1

## 2011-12-28 MED ORDER — INSULIN ASPART 100 UNIT/ML ~~LOC~~ SOLN
0.0000 [IU] | Freq: Three times a day (TID) | SUBCUTANEOUS | Status: DC
  Administered 2011-12-28: 11 [IU] via SUBCUTANEOUS
  Administered 2011-12-28 – 2011-12-29 (×2): 7 [IU] via SUBCUTANEOUS
  Administered 2011-12-29: 11 [IU] via SUBCUTANEOUS
  Administered 2011-12-30: 7 [IU] via SUBCUTANEOUS
  Administered 2011-12-30: 4 [IU] via SUBCUTANEOUS
  Administered 2011-12-30 – 2011-12-31 (×2): 3 [IU] via SUBCUTANEOUS
  Administered 2011-12-31 – 2012-01-01 (×2): 4 [IU] via SUBCUTANEOUS
  Administered 2012-01-01: 11 [IU] via SUBCUTANEOUS

## 2011-12-28 MED ORDER — INSULIN ASPART 100 UNIT/ML ~~LOC~~ SOLN
0.0000 [IU] | Freq: Every day | SUBCUTANEOUS | Status: DC
Start: 2011-12-28 — End: 2012-01-01
  Administered 2011-12-28: 4 [IU] via SUBCUTANEOUS
  Administered 2011-12-30: 2 [IU] via SUBCUTANEOUS

## 2011-12-28 MED ORDER — PANTOPRAZOLE SODIUM 40 MG PO TBEC
40.0000 mg | DELAYED_RELEASE_TABLET | Freq: Every morning | ORAL | Status: DC
  Administered 2011-12-28 – 2012-01-01 (×5): 40 mg via ORAL
  Filled 2011-12-28 (×6): qty 1

## 2011-12-28 MED ORDER — SODIUM CHLORIDE 0.9 % IJ SOLN
3.0000 mL | Freq: Two times a day (BID) | INTRAMUSCULAR | Status: DC
  Administered 2011-12-30 – 2011-12-31 (×3): 3 mL via INTRAVENOUS

## 2011-12-28 MED ORDER — HYDROMORPHONE HCL PF 1 MG/ML IJ SOLN
1.0000 mg | INTRAMUSCULAR | Status: DC | PRN
  Administered 2011-12-28: 1 mg via INTRAVENOUS
  Filled 2011-12-28: qty 1

## 2011-12-28 MED ORDER — DOCUSATE SODIUM 100 MG PO CAPS
100.0000 mg | ORAL_CAPSULE | Freq: Two times a day (BID) | ORAL | Status: DC
  Administered 2011-12-30 – 2011-12-31 (×2): 100 mg via ORAL
  Filled 2011-12-28 (×12): qty 1

## 2011-12-28 MED ORDER — METFORMIN HCL 500 MG PO TABS
1000.0000 mg | ORAL_TABLET | Freq: Two times a day (BID) | ORAL | Status: DC
  Administered 2011-12-28 – 2012-01-01 (×8): 1000 mg via ORAL
  Filled 2011-12-28 (×12): qty 2

## 2011-12-28 MED ORDER — CARBAMAZEPINE ER 400 MG PO TB12
600.0000 mg | ORAL_TABLET | Freq: Two times a day (BID) | ORAL | Status: DC
  Administered 2011-12-28 – 2012-01-01 (×9): 600 mg via ORAL
  Filled 2011-12-28 (×12): qty 1

## 2011-12-28 MED ORDER — FLUCONAZOLE 100 MG PO TABS
100.0000 mg | ORAL_TABLET | Freq: Every day | ORAL | Status: DC
  Administered 2011-12-28 – 2012-01-01 (×5): 100 mg via ORAL
  Filled 2011-12-28 (×6): qty 1

## 2011-12-28 MED ORDER — OSELTAMIVIR PHOSPHATE 75 MG PO CAPS
75.0000 mg | ORAL_CAPSULE | Freq: Two times a day (BID) | ORAL | Status: DC
  Administered 2011-12-28 – 2012-01-01 (×9): 75 mg via ORAL
  Filled 2011-12-28 (×12): qty 1

## 2011-12-28 MED ORDER — ESTRADIOL 2 MG PO TABS
2.0000 mg | ORAL_TABLET | Freq: Every morning | ORAL | Status: DC
  Administered 2011-12-28 – 2012-01-01 (×5): 2 mg via ORAL
  Filled 2011-12-28 (×6): qty 1

## 2011-12-28 MED ORDER — IPRATROPIUM BROMIDE 0.02 % IN SOLN
1.0000 mg | Freq: Once | RESPIRATORY_TRACT | Status: AC
  Administered 2011-12-28: 1 mg via RESPIRATORY_TRACT
  Filled 2011-12-28 (×2): qty 2.5

## 2011-12-28 MED ORDER — ONDANSETRON HCL 4 MG PO TABS: 4.0000 mg | ORAL_TABLET | Freq: Four times a day (QID) | ORAL | Status: DC | PRN

## 2011-12-28 MED ORDER — MEDROXYPROGESTERONE ACETATE 2.5 MG PO TABS
2.5000 mg | ORAL_TABLET | Freq: Every morning | ORAL | Status: DC
  Administered 2011-12-28 – 2012-01-01 (×5): 2.5 mg via ORAL
  Filled 2011-12-28 (×6): qty 1

## 2011-12-28 MED ORDER — EFAVIRENZ-EMTRICITAB-TENOFOVIR 600-200-300 MG PO TABS
1.0000 | ORAL_TABLET | Freq: Every day | ORAL | Status: DC
  Administered 2011-12-28 – 2011-12-31 (×4): 1 via ORAL
  Filled 2011-12-28 (×7): qty 1

## 2011-12-28 MED ORDER — METHYLPREDNISOLONE SODIUM SUCC 125 MG IJ SOLR
60.0000 mg | Freq: Four times a day (QID) | INTRAMUSCULAR | Status: DC
  Administered 2011-12-28 – 2011-12-29 (×5): 60 mg via INTRAVENOUS
  Filled 2011-12-28 (×8): qty 0.96

## 2011-12-28 MED ORDER — ONDANSETRON HCL 4 MG/2ML IJ SOLN: 4.0000 mg | Freq: Four times a day (QID) | INTRAMUSCULAR | Status: DC | PRN

## 2011-12-28 MED ORDER — GLIPIZIDE 10 MG PO TABS
10.0000 mg | ORAL_TABLET | Freq: Two times a day (BID) | ORAL | Status: DC
  Administered 2011-12-28 – 2012-01-01 (×8): 10 mg via ORAL
  Filled 2011-12-28 (×12): qty 1

## 2011-12-28 MED ORDER — SODIUM CHLORIDE 0.9 % IV SOLN
INTRAVENOUS | Status: DC
  Administered 2011-12-28 (×2): via INTRAVENOUS
  Administered 2011-12-29: 100 mL/h via INTRAVENOUS
  Administered 2011-12-29: 06:00:00 via INTRAVENOUS

## 2011-12-28 MED ORDER — GEMFIBROZIL 600 MG PO TABS
600.0000 mg | ORAL_TABLET | Freq: Two times a day (BID) | ORAL | Status: DC
  Administered 2011-12-28 – 2012-01-01 (×8): 600 mg via ORAL
  Filled 2011-12-28 (×12): qty 1

## 2011-12-28 MED ORDER — METFORMIN HCL 500 MG PO TABS
1000.0000 mg | ORAL_TABLET | Freq: Once | ORAL | Status: AC
  Administered 2011-12-28: 1000 mg via ORAL
  Filled 2011-12-28: qty 2

## 2011-12-28 MED ORDER — ATENOLOL 25 MG PO TABS
25.0000 mg | ORAL_TABLET | Freq: Every morning | ORAL | Status: DC
  Administered 2011-12-28 – 2012-01-01 (×5): 25 mg via ORAL
  Filled 2011-12-28 (×6): qty 1

## 2011-12-28 MED ORDER — ENOXAPARIN SODIUM 40 MG/0.4ML ~~LOC~~ SOLN
40.0000 mg | SUBCUTANEOUS | Status: DC
  Administered 2011-12-28 – 2011-12-31 (×4): 40 mg via SUBCUTANEOUS
  Filled 2011-12-28 (×6): qty 0.4

## 2011-12-28 MED ORDER — HYDROMORPHONE HCL PF 1 MG/ML IJ SOLN
1.0000 mg | Freq: Once | INTRAMUSCULAR | Status: AC
  Administered 2011-12-28: 1 mg via INTRAVENOUS
  Filled 2011-12-28: qty 1

## 2011-12-28 MED ORDER — SODIUM CHLORIDE 0.9 % IV BOLUS (SEPSIS)
1000.0000 mL | Freq: Once | INTRAVENOUS | Status: AC
  Administered 2011-12-28: 1000 mL via INTRAVENOUS

## 2011-12-28 MED ORDER — BUDESONIDE-FORMOTEROL FUMARATE 80-4.5 MCG/ACT IN AERO
2.0000 | INHALATION_SPRAY | Freq: Two times a day (BID) | RESPIRATORY_TRACT | Status: DC
  Administered 2011-12-28 – 2012-01-01 (×9): 2 via RESPIRATORY_TRACT
  Filled 2011-12-28: qty 6.9

## 2011-12-28 MED ORDER — DIAZEPAM 5 MG PO TABS
2.5000 mg | ORAL_TABLET | Freq: Every evening | ORAL | Status: DC | PRN
  Administered 2011-12-29 – 2011-12-31 (×2): 2.5 mg via ORAL
  Filled 2011-12-28 (×3): qty 1

## 2011-12-28 MED ORDER — HYDROMORPHONE HCL PF 1 MG/ML IJ SOLN
1.0000 mg | INTRAMUSCULAR | Status: DC | PRN
  Administered 2011-12-28: 2 mg via INTRAVENOUS
  Administered 2011-12-28: 1 mg via INTRAVENOUS
  Administered 2011-12-28: 2 mg via INTRAVENOUS
  Administered 2011-12-28 (×2): 1 mg via INTRAVENOUS
  Administered 2011-12-28 – 2011-12-29 (×7): 2 mg via INTRAVENOUS
  Administered 2011-12-29: 1 mg via INTRAVENOUS
  Administered 2011-12-29 – 2011-12-30 (×3): 2 mg via INTRAVENOUS
  Administered 2011-12-30: 1 mg via INTRAVENOUS
  Administered 2011-12-30 – 2011-12-31 (×10): 2 mg via INTRAVENOUS
  Filled 2011-12-28 (×17): qty 2
  Filled 2011-12-28: qty 1
  Filled 2011-12-28 (×5): qty 2
  Filled 2011-12-28 (×2): qty 1
  Filled 2011-12-28 (×3): qty 2

## 2011-12-28 MED ORDER — INSULIN GLARGINE 100 UNIT/ML ~~LOC~~ SOLN
50.0000 [IU] | Freq: Two times a day (BID) | SUBCUTANEOUS | Status: DC
  Administered 2011-12-28 – 2011-12-31 (×7): 50 [IU] via SUBCUTANEOUS

## 2011-12-28 MED ORDER — INSULIN ASPART 100 UNIT/ML ~~LOC~~ SOLN
15.0000 [IU] | Freq: Three times a day (TID) | SUBCUTANEOUS | Status: DC
  Administered 2011-12-28 – 2011-12-29 (×3): 15 [IU] via SUBCUTANEOUS

## 2011-12-28 MED ORDER — ALBUTEROL (5 MG/ML) CONTINUOUS INHALATION SOLN
10.0000 mg/h | INHALATION_SOLUTION | Freq: Once | RESPIRATORY_TRACT | Status: AC
  Administered 2011-12-28: 10 mg/h via RESPIRATORY_TRACT

## 2011-12-28 MED ORDER — GLIPIZIDE 10 MG PO TABS
10.0000 mg | ORAL_TABLET | Freq: Once | ORAL | Status: AC
  Administered 2011-12-28: 10 mg via ORAL
  Filled 2011-12-28: qty 1

## 2011-12-28 MED ORDER — METHYLPREDNISOLONE SODIUM SUCC 125 MG IJ SOLR
125.0000 mg | Freq: Once | INTRAMUSCULAR | Status: AC
  Administered 2011-12-28: 125 mg via INTRAVENOUS
  Filled 2011-12-28: qty 2

## 2011-12-28 MED ORDER — LEVOFLOXACIN IN D5W 750 MG/150ML IV SOLN
750.0000 mg | INTRAVENOUS | Status: DC
  Administered 2011-12-28 – 2011-12-30 (×3): 750 mg via INTRAVENOUS
  Filled 2011-12-28 (×3): qty 150

## 2011-12-28 MED ORDER — ACETAMINOPHEN 325 MG PO TABS: 650.0000 mg | ORAL_TABLET | ORAL | Status: DC | PRN

## 2011-12-28 MED ORDER — LEVALBUTEROL HCL 0.63 MG/3ML IN NEBU
0.6300 mg | INHALATION_SOLUTION | Freq: Four times a day (QID) | RESPIRATORY_TRACT | Status: DC
  Administered 2011-12-28 – 2012-01-01 (×16): 0.63 mg via RESPIRATORY_TRACT
  Filled 2011-12-28 (×25): qty 3

## 2011-12-28 NOTE — H&P (Signed)
[facility] History and Physical  EMILIO WELTMAN [id number] DOB: 1965/10/06    PCP:   Dr Pamala Hurry with [facility] I.D:  Dr Johnnye Sima.  Chief Complaint: cough and shortness of breath.  HPI: [name] is an 46 y.o. female HIV positive on Atripla, asthma on steroid inhaler and as needed albuterol, DM2, HTN, depression and anxiety, presents to the ER with 3 days hx of coughs, shortness of breath and wheezing.  Her mother had a bronchitis as well.  She did have her flu shot this year, but did complain of diffuse muscle aches.  She has subjective fever and chills.  Evaluation in the ER included a CXR with no infiltrate, a WBC of 13K, Hb 11g/DL, and normal renal fx tests, and Na 123.  She was given nebs, steroids, IV Rocephin, and hospitalist was asked to admit her as she still was having significant wheezing.  Rewiew of Systems:  Constitutional: Negative for malaise,  and chills. No significant weight loss or weight gain Eyes: Negative for eye pain, redness and discharge, diplopia, visual changes, or flashes of light. ENMT: Negative for ear pain, hoarseness, nasal congestion, sinus pressure and sore throat. No headaches; tinnitus, drooling, or problem swallowing. Cardiovascular: Negative for chest pain, palpitations, diaphoresis, dyspnea and peripheral edema. ; No orthopnea, PND Respiratory: Negative for hemoptysis,  and stridor. No pleuritic chestpain. Gastrointestinal: Negative for nausea, vomiting, diarrhea, constipation, abdominal pain, melena, blood in stool, hematemesis, jaundice and rectal bleeding.    Genitourinary: Negative for frequency, dysuria, incontinence,flank pain and hematuria; Musculoskeletal: Negative for back pain and neck pain. Negative for swelling and trauma.;  Skin: . Negative for pruritus, rash, abrasions, bruising and skin lesion.; ulcerations Neuro: Negative for headache, lightheadedness and neck stiffness. Negative for weakness, altered level of  consciousness , altered mental status, extremity weakness, burning feet, involuntary movement, seizure and syncope.  Psych: negative for anxiety, depression, insomnia, tearfulness, panic attacks, hallucinations, paranoia, suicidal or homicidal ideation    Past Medical History  Diagnosis Date  . Anxiety   . Depression   . Diabetes mellitus   . Hypertension   . Asthma   . HIV infection 2000    Past Surgical History  Procedure Date  . Hip pinning,cannulated 07/16/2011    Procedure: CANNULATED HIP PINNING;  Surgeon: Gearlean Alf, MD;  Location: [facility];  Service: Orthopedics;  Laterality: Left;    Medications:  HOME MEDS: Prior to Admission medications   Medication Sig Start Date End Date Taking? Authorizing Provider  albuterol (PROVENTIL) (2.5 MG/3ML) 0.083% nebulizer solution Take 2.5 mg by nebulization every 6 (six) hours as needed. For wheezing or shortness of breath   Yes Historical Provider, MD  atenolol (TENORMIN) 25 MG tablet Take 25 mg by mouth every morning.    Yes Historical Provider, MD  budesonide-formoterol (SYMBICORT) 80-4.5 MCG/ACT inhaler Inhale 2 puffs into the lungs 2 (two) times daily. 06/29/10  Yes Lyndee Hensen, NP  carbamazepine (TEGRETOL XR) 200 MG 12 hr tablet Take 600 mg by mouth 2 (two) times daily. 05/22/10  Yes Campbell Riches, MD  celecoxib (CELEBREX) 100 MG capsule Take 100 mg by mouth daily as needed. For pain   Yes Historical Provider, MD  diazepam (VALIUM) 5 MG tablet Take 2.5 mg by mouth at bedtime as needed. For sleep or anxiety 07/17/10  Yes Thayer Headings, MD  efavirenz-emtricitabine-tenofovir (ATRIPLA) 600-200-300 MG per tablet Take 1 tablet by mouth at bedtime. 10/02/11  Yes Campbell Riches, MD  estradiol (ESTRACE) 2  MG tablet Take 2 mg by mouth every morning. 12/21/11  Yes Lavonia Drafts, MD  fluconazole (DIFLUCAN) 100 MG tablet Take 100 mg by mouth daily as needed. For infection 12/11/10  Yes Campbell Riches, MD  gemfibrozil  (LOPID) 600 MG tablet Take 600 mg by mouth 2 (two) times daily before a meal. 1/2 hour before meals 09/14/10  Yes Thayer Headings, MD  glipiZIDE (GLUCOTROL) 10 MG tablet Take 10 mg by mouth 2 (two) times daily before a meal.   Yes Historical Provider, MD  hydrochlorothiazide (HYDRODIURIL) 25 MG tablet Take 25 mg by mouth every morning.    Yes Historical Provider, MD  insulin aspart (NOVOLOG) 100 UNIT/ML injection Inject 15 Units into the skin 4 (four) times daily -  before meals and at bedtime. Inject with meals per sliding scale and inject 15 units at bedtime 08/09/10  Yes Lyndee Hensen, NP  insulin glargine (LANTUS) 100 UNIT/ML injection Inject 50 Units into the skin 2 (two) times daily. 06/09/10  Yes Campbell Riches, MD  metFORMIN (GLUCOPHAGE) 1000 MG tablet Take 1,000 mg by mouth 2 (two) times daily with a meal.   Yes Historical Provider, MD  pantoprazole (PROTONIX) 40 MG tablet Take 40 mg by mouth every morning.  07/13/10  Yes Lyndee Hensen, NP  RisperiDONE (RISPERDAL PO) Take 1 tablet by mouth at bedtime.   Yes Historical Provider, MD  traMADol (ULTRAM) 50 MG tablet Take 50 mg by mouth every 8 (eight) hours as needed. For pain   Yes Historical Provider, MD  medroxyPROGESTERone (PROVERA) 2.5 MG tablet Take 2.5 mg by mouth every morning. Days 1-5 of the cycle 12/21/11   Lavonia Drafts, MD     Allergies:  No Known Allergies  Social History:   reports that she has been smoking Cigarettes.  She has a 17.5 pack-year smoking history. She has never used smokeless tobacco. She reports that she does not drink alcohol or use illicit drugs.  Family History: Family History  Problem Relation Age of Onset  . Diabetes Mother   . Hypertension Mother   . Vision loss Mother   . Hypertension Father      Physical Exam: Filed Vitals:   12/28/11 0230 12/28/11 0400  BP: 167/78   Pulse: 110   Temp: 99.5 F (37.5 C)   Resp: 28   SpO2: 91% 94%   Blood pressure 167/78, pulse 110,  temperature 99.5 F (37.5 C), resp. rate 28, last menstrual period 11/16/2010, SpO2 94.00%.  GEN:  Pleasant patient lying in the stretcher in no acute distress; cooperative with exam. PSYCH:  alert and oriented x4; does not appear anxious or depressed; affect is appropriate. HEENT: Mucous membranes pink and anicteric; PERRLA; EOM intact; no cervical lymphadenopathy nor thyromegaly or carotid bruit; no JVD; There were no stridor. Neck is very supple. Breasts:: Not examined CHEST WALL: No tenderness CHEST: Normal respiration, bilateral wheezing, but no rales. HEART: Regular rate and rhythm.  There are no murmur, rub, or gallops.   BACK: No kyphosis or scoliosis; no CVA tenderness ABDOMEN: soft and non-tender; no masses, no organomegaly, normal abdominal bowel sounds; no pannus; no intertriginous candida. There is no rebound and no distention. Rectal Exam: Not done EXTREMITIES: No bone or joint deformity; age-appropriate arthropathy of the hands and knees; no edema; no ulcerations.  There is no calf tenderness. Genitalia: not examined PULSES: 2+ and symmetric SKIN: Normal hydration no rash or ulceration CNS: Cranial nerves 2-12 grossly intact no focal lateralizing neurologic deficit.  Speech is fluent; uvula elevated with phonation, facial symmetry and tongue midline. DTR are normal bilaterally, cerebella exam is intact, barbinski is negative and strengths are equaled bilaterally.  No sensory loss.   Labs on Admission:  Basic Metabolic Panel:  Lab [id number] 0418  NA 123*  K 4.3  CL 84*  CO2 27  GLUCOSE 213*  BUN 13  CREATININE 0.76  CALCIUM 9.7  MG --  PHOS --   Liver Function Tests: No results found for this basename: AST:5,ALT:5,ALKPHOS:5,BILITOT:5,PROT:5,ALBUMIN:5 in the last 168 hours No results found for this basename: LIPASE:5,AMYLASE:5 in the last 168 hours No results found for this basename: AMMONIA:5 in the last 168 hours CBC:  Lab 12/28/11 0418  WBC 13.1*  NEUTROABS  11.1*  HGB 12.1  HCT 36.4  MCV 82.7  PLT 437*   Cardiac Enzymes: No results found for this basename: CKTOTAL:5,CKMB:5,CKMBINDEX:5,TROPONINI:5 in the last 168 hours  CBG: No results found for this basename: GLUCAP:5 in the last 168 hours   Radiological Exams on Admission: Dg Chest 2 View  12/28/2011  *RADIOLOGY REPORT*  Clinical Data: Cough and chest pain.  CHEST - 2 VIEW  Comparison: Chest radiograph performed 12/03/2008  Findings: The lungs are well-aerated and clear.  There is no evidence of focal opacification, pleural effusion or pneumothorax.  The heart is borderline enlarged.  No acute osseous abnormalities are seen.  IMPRESSION: No acute cardiopulmonary process seen; borderline cardiomegaly noted.   Original Report Authenticated By: Santa Lighter, M.D.     Assessment/Plan Present on Admission:  . Tobacco use disorder . HIV DISEASE . HYPERLIPIDEMIA . HYPERTENSION . DIABETES MELLITUS, TYPE II . DEPRESSION Hyponatremia *Asthma exacerbation.  PLAN:  Will admit her for asthma exacerbation.  She has HIV disease and I will continue her meds.  Will check a CD4 count.  For her asthma, she will need IV steroid, along with an antibiotic which I will use Levoquin IV.  The steroid will undoubtedly raise her CBG, so we will implement SSI of the resistent scale.  I have sent a flu PCR test and started her empirically on Tamiflu as well.   Her sodium is a little low, and will give NS IVF.  She is stable, full code, and will be admitted to [facility] service.  Other plans as per orders.  Code Status: FULL Haskel Khan, MD. [facility] Pager [phone] 7pm to 7am.  12/28/2011, 6:14 AM

## 2011-12-28 NOTE — Progress Notes (Signed)
   CARE MANAGEMENT NOTE 12/28/2011  Patient:  [name],[name]   Account Number:  [id number]  Date Initiated:  12/28/2011  Documentation initiated by:  Olga Coaster  Subjective/Objective Assessment:   ADMITTED WITH ASTHMA/ BRONCHITIS     Action/Plan:   PCP:   Dr Pamala Hurry with [facility]  [name].D:  Dr Johnnye Sima.  LIVES WITH FAMILY MEMBERS   Anticipated DC Date:  01/01/2012   Anticipated DC Plan:  [facility]  CM consult              Status of service:  In process, will continue to follow Medicare Important Message given?  NA - LOS <3 / Initial given by admissions (If response is "NO", the following Medicare IM given date fields will be blank)  Per UR Regulation:  Reviewed for med. necessity/level of care/duration of stay  Comments:  12/28/2011- B Alora Gorey RN,BSN,MHA

## 2011-12-28 NOTE — Progress Notes (Signed)
[facility] PROGRESS NOTE  [name] [id number] DOB: 08-Dec-1965 DOA: 12/28/2011 PCP: No primary provider on file.  Assessment/Plan: 1. Asthma exacerbation: - steroids, nebs, levaquin. , oxygen as needed.  - tamiflu empirically.   2. HIV : resume home medications.   3. Hyponatremia: possibly fro dehydration and HIV. Hydration and repeat in am.   4. Diabetes Mellitus: CBG (last 3)   Basename 12/28/11 1145 12/28/11 0806  GLUCAP 256* 287*    Resume home medications and SSI.    5. dvt prophylaxis  Code Status: full code Family Communication: none at bedside Disposition Plan: POssibly in 1 to 2 days      Antibiotics:  LEVAQUIN  HPI/Subjective: TACHYPNEIC  Objective: Filed Vitals:   12/28/11 0400 12/28/11 0646 12/28/11 0812 12/28/11 0928  BP:  163/76 162/73   Pulse:  122 121   Temp:  98.1 F (36.7 C) 99.4 F (37.4 C)   TempSrc:  Oral Oral   Resp:  20 20   Height:   5\' 10"  (1.778 m)   Weight:   111.585 kg (246 lb)   SpO2: 94% 94% 96% 94%    Intake/Output Summary (Last 24 hours) at 12/28/11 1040 Last data filed at 12/28/11 1026  Gross per 24 hour  Intake    120 ml  Output    600 ml  Net   -480 ml   Filed Weights   12/28/11 0812  Weight: 111.585 kg (246 lb)    Exam:   General:  Alert afebrile in mild distress  Cardiovascular: s1s2 tachycardic  Respiratory: ctab bilateral exp wheezing heard  Abdomen: soft obese NT ND BS+  Data Reviewed: Basic Metabolic Panel:  Lab [id number] 0910 12/28/11 0418  NA -- 123*  K -- 4.3  CL -- 84*  CO2 -- 27  GLUCOSE -- 213*  BUN -- 13  CREATININE 0.77 0.76  CALCIUM -- 9.7  MG -- --  PHOS -- --   Liver Function Tests: No results found for this basename: AST:5,ALT:5,ALKPHOS:5,BILITOT:5,PROT:5,ALBUMIN:5 in the last 168 hours No results found for this basename: LIPASE:5,AMYLASE:5 in the last 168 hours No results found for this basename: AMMONIA:5 in the last 168 hours CBC:  Lab 12/28/11  0910 12/28/11 0418  WBC 12.2* 13.1*  NEUTROABS -- 11.1*  HGB 11.2* 12.1  HCT 34.9* 36.4  MCV 83.7 82.7  PLT 444* 437*   Cardiac Enzymes: No results found for this basename: CKTOTAL:5,CKMB:5,CKMBINDEX:5,TROPONINI:5 in the last 168 hours BNP (last 3 results) No results found for this basename: PROBNP:3 in the last 8760 hours CBG:  Lab 12/28/11 0806  GLUCAP 287*    No results found for this or any previous visit (from the past 240 hour(s)).   Studies: Dg Chest 2 View  12/28/2011  *RADIOLOGY REPORT*  Clinical Data: Cough and chest pain.  CHEST - 2 VIEW  Comparison: Chest radiograph performed 12/03/2008  Findings: The lungs are well-aerated and clear.  There is no evidence of focal opacification, pleural effusion or pneumothorax.  The heart is borderline enlarged.  No acute osseous abnormalities are seen.  IMPRESSION: No acute cardiopulmonary process seen; borderline cardiomegaly noted.   Original Report Authenticated By: Santa Lighter, M.D.     Scheduled Meds:   . atenolol  25 mg Oral q morning - 10a  . budesonide-formoterol  2 puff Inhalation BID  . carbamazepine  600 mg Oral BID  . docusate sodium  100 mg Oral BID  . efavirenz-emtricitabine-tenofovir  1 tablet Oral QHS  . enoxaparin (LOVENOX)  injection  40 mg Subcutaneous Q24H  . estradiol  2 mg Oral q morning - 10a  . fluconazole  100 mg Oral Daily  . gemfibrozil  600 mg Oral BID AC  . glipiZIDE  10 mg Oral BID AC  . glipiZIDE  10 mg Oral Once  . insulin aspart  0-20 Units Subcutaneous TID WC  . insulin aspart  0-5 Units Subcutaneous QHS  . insulin aspart  15 Units Subcutaneous TID AC & HS  . insulin glargine  50 Units Subcutaneous BID  . levalbuterol  0.63 mg Nebulization Q6H  . levofloxacin  750 mg Intravenous Q24H  . medroxyPROGESTERone  2.5 mg Oral q morning - 10a  . metFORMIN  1,000 mg Oral BID WC  . metFORMIN  1,000 mg Oral Once  . methylPREDNISolone (SOLU-MEDROL) injection  40 mg Intravenous Q6H  . oseltamivir   75 mg Oral BID  . pantoprazole  40 mg Oral q morning - 10a  . sodium chloride  3 mL Intravenous Q12H   Continuous Infusions:   . sodium chloride 100 mL/hr at 12/28/11 [id number]    Principal Problem:  *Asthma exacerbation attacks Active Problems:  HIV DISEASE  DIABETES MELLITUS, TYPE II  HYPERLIPIDEMIA  DEPRESSION  HYPERTENSION  Tobacco use disorder  Bronchitis        [name]  [facility] Pager [phone]. If 8PM-8AM, please contact night-coverage at [email or web address], password [facility] 12/28/2011, 10:40 AM  LOS: 0 days

## 2011-12-28 NOTE — ED Notes (Signed)
Patient transported to X-ray 

## 2011-12-28 NOTE — Progress Notes (Signed)
Inpatient Diabetes Program Recommendations  AACE/ADA: New Consensus Statement on Inpatient Glycemic Control  Target Ranges:  Prepandial:   less than 140 mg/dL      Peak postprandial:   less than 180 mg/dL (1-2 hours)      Critically ill patients:  140 - 180 mg/dL  Pager:  [phone] Hours:  8 am-10pm   Reason for Visit: History of Diabetes  Inpatient Diabetes Program Recommendations HgbA1C: Check HgbA1C to assess glycemic control  Courtney Heys PhD, RN, BC-ADM Diabetes Coordinator  Office:  [phone] Team Pager:  [phone]

## 2011-12-28 NOTE — ED Provider Notes (Signed)
Medical screening examination/treatment/procedure(s) were performed by non-physician practitioner and as supervising physician I was immediately available for consultation/collaboration. Rolland Porter, MD, FACEP   Janice Norrie, MD 12/28/11 [phone]

## 2011-12-28 NOTE — ED Notes (Signed)
Pt c/o shortness of breath/cough x 2 days; fever

## 2011-12-28 NOTE — ED Provider Notes (Signed)
History    CSN: [id number] Arrival date & time 12/28/11  0225  First MD Initiated Contact with Patient 12/28/11 [phone]      Chief Complaint  Patient presents with  . Shortness of Breath   HPI  History provided by the patient. Patient is a 46 year old female with history of HIV , hypertension, diabetes, asthma, anxiety and depression with last CD4 count of 430 in July who presents with complaints of worsening cough and shortness of breath symptoms. Patient reports that household have been sick recently with bronchitis and cough symptoms. Patient began developing a cough on Christmas day which progressively got worse. Yesterday she has difficulty breathing with increased wheezing unrelieved with home albuterol in Symbicort. Symptoms are worse with activity. It she reports having some subjective fevers but denies any chills or sweats. Patient also began taking leftover prednisone which she had from an old prescription prior to arrival as well as 500 mg of azithromycin from a leftover Z-Pak for which she has a full prescription. She continues to report no improvement in symptoms.    Past Medical History  Diagnosis Date  . Anxiety   . Depression   . Diabetes mellitus   . Hypertension   . Asthma   . HIV infection 2000    Past Surgical History  Procedure Date  . Hip pinning,cannulated 07/16/2011    Procedure: CANNULATED HIP PINNING;  Surgeon: Gearlean Alf, MD;  Location: [facility];  Service: Orthopedics;  Laterality: Left;    Family History  Problem Relation Age of Onset  . Diabetes Mother   . Hypertension Mother   . Vision loss Mother   . Hypertension Father     History  Substance Use Topics  . Smoking status: Current Every Day Smoker -- 0.5 packs/day for 35 years    Types: Cigarettes  . Smokeless tobacco: Never Used  . Alcohol Use: No    OB History    Grav Para Term Preterm Abortions TAB SAB Ect Mult Living   2 1 1  0 1  1 0 0 1      Review of Systems  Constitutional:  Positive for fever. Negative for chills and diaphoresis.  HENT: Positive for sore throat. Negative for congestion and rhinorrhea.   Respiratory: Positive for cough, shortness of breath and wheezing.   Cardiovascular: Positive for chest pain. Negative for palpitations.  Gastrointestinal: Negative for nausea, vomiting, abdominal pain, diarrhea and constipation.  All other systems reviewed and are negative.    Allergies  Review of patient's allergies indicates no known allergies.  Home Medications   Current Outpatient Rx  Name  Route  Sig  Dispense  Refill  . ALBUTEROL SULFATE (2.5 MG/3ML) 0.083% IN NEBU   Nebulization   Take 2.5 mg by nebulization every 6 (six) hours as needed.           . ATENOLOL 25 MG PO TABS   Oral   Take 25 mg by mouth daily.         . ATENOLOL 25 MG PO TABS      TAKE ONE TABLET BY MOUTH EVERY DAY   30 tablet   3   . BUDESONIDE-FORMOTEROL FUMARATE 80-4.5 MCG/ACT IN AERO   Inhalation   Inhale 2 puffs into the lungs 2 (two) times daily.         . BUPROPION HCL ER (XL) 150 MG PO TB24   Oral   Take 150 mg by mouth daily.         [name] Kitchen  CARBAMAZEPINE ER 200 MG PO TB12   Oral   Take 600 mg by mouth 2 (two) times daily.         . CELECOXIB 100 MG PO CAPS   Oral   Take 100 mg by mouth 2 (two) times daily.           [name] Kitchen CITALOPRAM HYDROBROMIDE 20 MG PO TABS   Oral   Take 20 mg by mouth daily.           [name] Kitchen DIAZEPAM 5 MG PO TABS   Oral   Take 7.5 mg by mouth at bedtime as needed. 5 mg tab, 1/2 tab nightly         . EFAVIRENZ-EMTRICITAB-TENOFOVIR 600-200-300 MG PO TABS   Oral   Take 1 tablet by mouth at bedtime.   30 tablet   6   . ESTRADIOL 2 MG PO TABS   Oral   Take 1 tablet (2 mg total) by mouth daily.   30 tablet   0   . FLUCONAZOLE 100 MG PO TABS   Oral   Take 100 mg by mouth daily.         [name] Kitchen GEMFIBROZIL 600 MG PO TABS   Oral   Take 600 mg by mouth 2 (two) times daily before a meal. 1/2 hour before meals         .  GLIPIZIDE 10 MG PO TABS   Oral   Take 10 mg by mouth 2 (two) times daily before a meal.         . GLIPIZIDE 10 MG PO TABS      TAKE ONE TABLET BY MOUTH TWICE DAILY   60 tablet   0   . HYDROCHLOROTHIAZIDE 25 MG PO TABS   Oral   Take 25 mg by mouth daily.         [name] Kitchen HYDROCHLOROTHIAZIDE 25 MG PO TABS      TAKE ONE TABLET BY MOUTH EVERY DAY   30 tablet   3   . INSULIN ASPART 100 UNIT/ML [place] SOLN      Per sliding scale (15 units before bedtime)         . INSULIN GLARGINE 100 UNIT/ML [place] SOLN   Subcutaneous   Inject 50 Units into the skin 2 (two) times daily.         [name] Kitchen POLYSACCHARIDE IRON COMPLEX 150 MG PO CAPS   Oral   Take 1 capsule (150 mg total) by mouth daily.   30 capsule   0   . MEDROXYPROGESTERONE ACETATE 2.5 MG PO TABS   Oral   Take 1 tablet (2.5 mg total) by mouth daily. Days 1-5 of the cycle   5 tablet   0   . METFORMIN HCL 1000 MG PO TABS   Oral   Take 1,000 mg by mouth 2 (two) times daily with a meal.         . METFORMIN HCL 1000 MG PO TABS      TAKE ONE TABLET BY MOUTH TWICE DAILY   60 tablet   3   . PANTOPRAZOLE SODIUM 40 MG PO TBEC   Oral   Take 40 mg by mouth daily.         . TRAMADOL HCL 50 MG PO TABS   Oral   Take 50 mg by mouth every 8 (eight) hours as needed.           BP 167/78  Pulse 110  Temp 99.5 F (37.5 C)  Resp 28  SpO2 91%  LMP 11/16/2010  Physical Exam  Nursing note and vitals reviewed. Constitutional: She is oriented to person, place, and time. She appears well-developed and well-nourished. No distress.  HENT:  Head: Normocephalic.  Cardiovascular: Normal rate and regular rhythm.   Pulmonary/Chest: Effort normal. No respiratory distress. She has wheezes. She has no rales.  Neurological: She is alert and oriented to person, place, and time.  Skin: Skin is warm and dry. No rash noted.  Psychiatric: She has a normal mood and affect. Her behavior is normal.    ED Course  Procedures   Results for orders  placed during the hospital encounter of 12/28/11  CBC WITH DIFFERENTIAL      Component Value Range   WBC 13.1 (*) 4.0 - 10.5 K/uL   RBC 4.40  3.87 - 5.11 MIL/uL   Hemoglobin 12.1  12.0 - 15.0 g/dL   HCT 36.4  36.0 - 46.0 %   MCV 82.7  78.0 - 100.0 fL   MCH 27.5  26.0 - 34.0 pg   MCHC 33.2  30.0 - 36.0 g/dL   RDW 16.2 (*) 11.5 - 15.5 %   Platelets 437 (*) 150 - 400 K/uL   Neutrophils Relative 85 (*) 43 - 77 %   Neutro Abs 11.1 (*) 1.7 - 7.7 K/uL   Lymphocytes Relative 6 (*) 12 - 46 %   Lymphs Abs 0.8  0.7 - 4.0 K/uL   Monocytes Relative 7  3 - 12 %   Monocytes Absolute 0.9  0.1 - 1.0 K/uL   Eosinophils Relative 1  0 - 5 %   Eosinophils Absolute 0.1  0.0 - 0.7 K/uL   Basophils Relative 1  0 - 1 %   Basophils Absolute 0.1  0.0 - 0.1 K/uL  BASIC METABOLIC PANEL      Component Value Range   Sodium 123 (*) 135 - 145 mEq/L   Potassium 4.3  3.5 - 5.1 mEq/L   Chloride 84 (*) 96 - 112 mEq/L   CO2 27  19 - 32 mEq/L   Glucose, Bld 213 (*) 70 - 99 mg/dL   BUN 13  6 - 23 mg/dL   Creatinine, Ser 0.76  0.50 - 1.10 mg/dL   Calcium 9.7  8.4 - 10.5 mg/dL   GFR calc non Af Amer >90  >90 mL/min   GFR calc Af Amer >90  >90 mL/min      Dg Chest 2 View  12/28/2011  *RADIOLOGY REPORT*  Clinical Data: Cough and chest pain.  CHEST - 2 VIEW  Comparison: Chest radiograph performed 12/03/2008  Findings: The lungs are well-aerated and clear.  There is no evidence of focal opacification, pleural effusion or pneumothorax.  The heart is borderline enlarged.  No acute osseous abnormalities are seen.  IMPRESSION: No acute cardiopulmonary process seen; borderline cardiomegaly noted.   Original Report Authenticated By: Santa Lighter, M.D.      1. Asthma exacerbation attacks   2. Acute bronchitis   3. Human immunodeficiency virus (HIV) disease   4. Tobacco use disorder       MDM  3:10AM patient seen and evaluated. Patient with tachypnea and slight tachycardia. Currently maintaining good O2 sats on 2 L  nasal cannula.   Patient reports not feeling any significant improvement after breathing treatment. She continues to have wheezing on exam. She continues to be tachypneic. She does maintain O2 sats at 96% on 2 L nasal cannula. Heart rate is still elevated.  Patient  with hyponatremia. She has received IV fluids. Patient still continues of pain as well. Additional pain medications ordered.  Spoke with [facility]. They'll see patient and admit.     [place], [place] 12/28/11 [phone]

## 2011-12-28 NOTE — ED Notes (Signed)
Pt states that she could no longer tolerate the continuous breathing tx. Pt states it was making her shake and she wanted to put the nasal canula back on. Breathing tx stopped per pt request.

## 2011-12-29 DIAGNOSIS — J101 Influenza due to other identified influenza virus with other respiratory manifestations: Secondary | ICD-10-CM

## 2011-12-29 DIAGNOSIS — E871 Hypo-osmolality and hyponatremia: Secondary | ICD-10-CM

## 2011-12-29 DIAGNOSIS — J111 Influenza due to unidentified influenza virus with other respiratory manifestations: Secondary | ICD-10-CM

## 2011-12-29 HISTORY — DX: Hypo-osmolality and hyponatremia: E87.1

## 2011-12-29 HISTORY — DX: Influenza due to other identified influenza virus with other respiratory manifestations: J10.1

## 2011-12-29 MED ORDER — METHYLPREDNISOLONE SODIUM SUCC 125 MG IJ SOLR
60.0000 mg | Freq: Three times a day (TID) | INTRAMUSCULAR | Status: DC
  Administered 2011-12-29 – 2011-12-31 (×5): 60 mg via INTRAVENOUS
  Filled 2011-12-29 (×7): qty 0.96

## 2011-12-29 MED ORDER — LOPERAMIDE HCL 2 MG PO CAPS
2.0000 mg | ORAL_CAPSULE | ORAL | Status: DC | PRN
  Administered 2011-12-29 – 2011-12-30 (×3): 2 mg via ORAL
  Filled 2011-12-29 (×3): qty 1

## 2011-12-29 MED ORDER — RISPERIDONE 1 MG PO TABS
1.0000 mg | ORAL_TABLET | Freq: Two times a day (BID) | ORAL | Status: DC
  Administered 2011-12-29 – 2012-01-01 (×7): 1 mg via ORAL
  Filled 2011-12-29 (×11): qty 1

## 2011-12-29 MED ORDER — INSULIN ASPART 100 UNIT/ML ~~LOC~~ SOLN
18.0000 [IU] | Freq: Three times a day (TID) | SUBCUTANEOUS | Status: DC
  Administered 2011-12-29 – 2012-01-01 (×9): 18 [IU] via SUBCUTANEOUS

## 2011-12-29 NOTE — Progress Notes (Signed)
[facility] PROGRESS NOTE  [name] [id number] DOB: 1965-03-13 DOA: 12/28/2011 PCP: No primary provider on file.  Assessment/Plan: 1. Asthma exacerbation influenza positive: - steroids, nebs, levaquin. , oxygen as needed.  - tamiflu   2. HIV : resume home medications.   3. Hyponatremia: possibly fro dehydration and HIV. Hydration and repeat in am.   4. Diabetes Mellitus: CBG (last 3)   Basename 12/28/11 2142 12/28/11 1726 12/28/11 1145  GLUCAP 329* 201* 256*    Resume home medications and SSI.  Increased cbg 's possibly from steroids will increase novolog to 18 units TIDAC and bedtime.   5. dvt prophylaxis  6. Diarrhea: stool for c diff pcr negative. Imodium ordered.   Code Status: full code Family Communication: none at bedside Disposition Plan: POssibly in 1 to 2 days      Antibiotics:  LEVAQUIN  HPI/Subjective: Reports she is feeing better. No fever or chills.   Objective: Filed Vitals:   12/29/11 0546 12/29/11 0744 12/29/11 1400 12/29/11 1531  BP: 112/65  128/76   Pulse: 87  71   Temp: 98.7 F (37.1 C)  97.4 F (36.3 C)   TempSrc: Oral  Oral   Resp: 21  18   Height:      Weight:      SpO2: 100% 95% 100% 96%    Intake/Output Summary (Last 24 hours) at 12/29/11 1752 Last data filed at 12/29/11 1613  Gross per 24 hour  Intake 3472.33 ml  Output   2925 ml  Net 547.33 ml   Filed Weights   12/28/11 0812  Weight: 111.585 kg (246 lb)    Exam:   General:  Alert afebrile in mild distress  Cardiovascular: s1s2 tachycardic  Respiratory: ctab bilateral exp wheezing heard  Abdomen: soft obese NT ND BS+  Data Reviewed: Basic Metabolic Panel:  Lab [id number] 0910 12/28/11 0418  NA -- 123*  K -- 4.3  CL -- 84*  CO2 -- 27  GLUCOSE -- 213*  BUN -- 13  CREATININE 0.77 0.76  CALCIUM -- 9.7  MG -- --  PHOS -- --   Liver Function Tests: No results found for this basename: AST:5,ALT:5,ALKPHOS:5,BILITOT:5,PROT:5,ALBUMIN:5 in  the last 168 hours No results found for this basename: LIPASE:5,AMYLASE:5 in the last 168 hours No results found for this basename: AMMONIA:5 in the last 168 hours CBC:  Lab 12/28/11 0910 12/28/11 0418  WBC 12.2* 13.1*  NEUTROABS -- 11.1*  HGB 11.2* 12.1  HCT 34.9* 36.4  MCV 83.7 82.7  PLT 444* 437*   Cardiac Enzymes: No results found for this basename: CKTOTAL:5,CKMB:5,CKMBINDEX:5,TROPONINI:5 in the last 168 hours BNP (last 3 results) No results found for this basename: PROBNP:3 in the last 8760 hours CBG:  Lab 12/28/11 2142 12/28/11 1726 12/28/11 1145 12/28/11 0806  GLUCAP 329* 201* 256* 287*    Recent Results (from the past 240 hour(s))  CLOSTRIDIUM DIFFICILE BY PCR     Status: Normal   Collection Time   12/28/11  9:06 PM      Component Value Range Status Comment   C difficile by pcr NEGATIVE  NEGATIVE Final      Studies: Dg Chest 2 View  12/28/2011  *RADIOLOGY REPORT*  Clinical Data: Cough and chest pain.  CHEST - 2 VIEW  Comparison: Chest radiograph performed 12/03/2008  Findings: The lungs are well-aerated and clear.  There is no evidence of focal opacification, pleural effusion or pneumothorax.  The heart is borderline enlarged.  No acute osseous abnormalities are seen.  IMPRESSION: No acute cardiopulmonary process seen; borderline cardiomegaly noted.   Original Report Authenticated By: Santa Lighter, M.D.     Scheduled Meds:    . atenolol  25 mg Oral q morning - 10a  . budesonide-formoterol  2 puff Inhalation BID  . carbamazepine  600 mg Oral BID  . docusate sodium  100 mg Oral BID  . efavirenz-emtricitabine-tenofovir  1 tablet Oral QHS  . enoxaparin (LOVENOX) injection  40 mg Subcutaneous Q24H  . estradiol  2 mg Oral q morning - 10a  . fluconazole  100 mg Oral Daily  . gemfibrozil  600 mg Oral BID AC  . glipiZIDE  10 mg Oral BID AC  . insulin aspart  0-20 Units Subcutaneous TID WC  . insulin aspart  0-5 Units Subcutaneous QHS  . insulin aspart  15 Units  Subcutaneous TID AC & HS  . insulin glargine  50 Units Subcutaneous BID  . levalbuterol  0.63 mg Nebulization Q6H  . levofloxacin  750 mg Intravenous Q24H  . medroxyPROGESTERone  2.5 mg Oral q morning - 10a  . metFORMIN  1,000 mg Oral BID WC  . methylPREDNISolone (SOLU-MEDROL) injection  60 mg Intravenous Q6H  . oseltamivir  75 mg Oral BID  . pantoprazole  40 mg Oral q morning - 10a  . risperiDONE  1 mg Oral BID  . sodium chloride  3 mL Intravenous Q12H   Continuous Infusions:    . sodium chloride 100 mL/hr (12/29/11 1752)    Principal Problem:  *Asthma exacerbation attacks Active Problems:  HIV DISEASE  DIABETES MELLITUS, TYPE II  HYPERLIPIDEMIA  DEPRESSION  HYPERTENSION  Tobacco use disorder  Bronchitis        [name]  [facility] Pager [phone]. If 8PM-8AM, please contact night-coverage at [email or web address], password [facility] 12/29/2011, 5:52 PM  LOS: 1 day

## 2011-12-30 DIAGNOSIS — E871 Hypo-osmolality and hyponatremia: Secondary | ICD-10-CM

## 2011-12-30 LAB — MAGNESIUM: Magnesium: 1.8 mg/dL (ref 1.5–2.5)

## 2011-12-30 LAB — GLUCOSE, CAPILLARY
Glucose-Capillary: 166 mg/dL — ABNORMAL HIGH (ref 70–99)
Glucose-Capillary: 204 mg/dL — ABNORMAL HIGH (ref 70–99)
Glucose-Capillary: 134 mg/dL — ABNORMAL HIGH (ref 70–99)
Glucose-Capillary: 169 mg/dL — ABNORMAL HIGH (ref 70–99)
Glucose-Capillary: 230 mg/dL — ABNORMAL HIGH (ref 70–99)

## 2011-12-30 LAB — CBC
Hemoglobin: 10.1 g/dL — ABNORMAL LOW (ref 12.0–15.0)
MCH: 27.7 pg (ref 26.0–34.0)
MCV: 86 fL (ref 78.0–100.0)
Platelets: 361 10*3/uL (ref 150–400)
RBC: 3.64 MIL/uL — ABNORMAL LOW (ref 3.87–5.11)
WBC: 8.2 10*3/uL (ref 4.0–10.5)

## 2011-12-30 LAB — BASIC METABOLIC PANEL
BUN: 24 mg/dL — ABNORMAL HIGH (ref 6–23)
CO2: 24 mEq/L (ref 19–32)
Calcium: 9.2 mg/dL (ref 8.4–10.5)
Chloride: 98 mEq/L (ref 96–112)
Creatinine, Ser: 0.96 mg/dL (ref 0.50–1.10)
GFR calc Af Amer: 80 mL/min — ABNORMAL LOW (ref >90)
GFR calc non Af Amer: 69 mL/min — ABNORMAL LOW (ref >90)
Glucose, Bld: 174 mg/dL — ABNORMAL HIGH (ref 70–99)
Potassium: 4.7 mEq/L (ref 3.5–5.1)
Sodium: 134 mEq/L — ABNORMAL LOW (ref 135–145)

## 2011-12-30 MED ORDER — LEVOFLOXACIN 750 MG PO TABS
750.0000 mg | ORAL_TABLET | Freq: Every day | ORAL | Status: DC
  Administered 2011-12-31 – 2012-01-01 (×2): 750 mg via ORAL
  Filled 2011-12-30 (×3): qty 1

## 2011-12-30 NOTE — Progress Notes (Signed)
[facility] PROGRESS NOTE  [name] [id number] DOB: [birth year] DOA: 12/28/2011 PCP: No primary provider on file.  Assessment/Plan: 1. Asthma exacerbation influenza positive: - steroids, nebs, levaquin. , oxygen as needed.  - tamiflu  - started tapering her IV steroids.   2. HIV : resume home medications.   3. Hyponatremia:  Improving. possibly fro dehydration and HIV. Hydration and repeat in am.   4. Diabetes Mellitus: CBG (last 3)   Basename 12/30/11 1559 12/30/11 1157 12/30/11 0739  GLUCAP 134* 204* 166*    Resume home medications and SSI.  Increased cbg 's possibly from steroids will increase novolog to 18 units TIDAC and bedtime.   5. dvt prophylaxis  6. Diarrhea: stool for c diff pcr negative. Imodium ordered.   Code Status: full code Family Communication: none at bedside Disposition Plan: POssibly in 1 to 2 days      Antibiotics:  LEVAQUIN  HPI/Subjective: Reports she is feeing better. No fever or chills.   Objective: Filed Vitals:   12/29/11 2255 12/30/11 0524 12/30/11 0810 12/30/11 1429  BP: 134/68 103/59  130/79  Pulse: 83 74  73  Temp: 98.1 F (36.7 C) 97.5 F (36.4 C)  97.6 F (36.4 C)  TempSrc: Oral Oral  Oral  Resp:  16  18  Height:      Weight:      SpO2: 97% 100% 97% 98%    Intake/Output Summary (Last 24 hours) at 12/30/11 1623 Last data filed at 12/30/11 0900  Gross per 24 hour  Intake   1245 ml  Output      0 ml  Net   1245 ml   Filed Weights   12/28/11 0812  Weight: 111.585 kg (246 lb)    Exam:   General:  Alert afebrile in mild distress  Cardiovascular: s1s2 tachycardic  Respiratory: ctab bilateral exp wheezing heard  Abdomen: soft obese NT ND BS+  Data Reviewed: Basic Metabolic Panel:  Lab [id number] 0428 12/28/11 0910 12/28/11 0418  NA 134* -- 123*  K 4.7 -- 4.3  CL 98 -- 84*  CO2 24 -- 27  GLUCOSE 174* -- 213*  BUN 24* -- 13  CREATININE 0.96 0.77 0.76  CALCIUM 9.2 -- 9.7  MG 1.8 -- --    PHOS -- -- --   Liver Function Tests: No results found for this basename: AST:5,ALT:5,ALKPHOS:5,BILITOT:5,PROT:5,ALBUMIN:5 in the last 168 hours No results found for this basename: LIPASE:5,AMYLASE:5 in the last 168 hours No results found for this basename: AMMONIA:5 in the last 168 hours CBC:  Lab 12/30/11 0428 12/28/11 0910 12/28/11 0418  WBC 8.2 12.2* 13.1*  NEUTROABS -- -- 11.1*  HGB 10.1* 11.2* 12.1  HCT 31.3* 34.9* 36.4  MCV 86.0 83.7 82.7  PLT 361 444* 437*   Cardiac Enzymes: No results found for this basename: CKTOTAL:5,CKMB:5,CKMBINDEX:5,TROPONINI:5 in the last 168 hours BNP (last 3 results) No results found for this basename: PROBNP:3 in the last 8760 hours CBG:  Lab 12/30/11 1559 12/30/11 1157 12/30/11 0739 12/28/11 2142 12/28/11 1726  GLUCAP 134* 204* 166* 329* 201*    Recent Results (from the past 240 hour(s))  CLOSTRIDIUM DIFFICILE BY PCR     Status: Normal   Collection Time   12/28/11  9:06 PM      Component Value Range Status Comment   C difficile by pcr NEGATIVE  NEGATIVE Final      Studies: No results found.  Scheduled Meds:    . atenolol  25 mg Oral q morning -  10a  . budesonide-formoterol  2 puff Inhalation BID  . carbamazepine  600 mg Oral BID  . docusate sodium  100 mg Oral BID  . efavirenz-emtricitabine-tenofovir  1 tablet Oral QHS  . enoxaparin (LOVENOX) injection  40 mg Subcutaneous Q24H  . estradiol  2 mg Oral q morning - 10a  . fluconazole  100 mg Oral Daily  . gemfibrozil  600 mg Oral BID AC  . glipiZIDE  10 mg Oral BID AC  . insulin aspart  0-20 Units Subcutaneous TID WC  . insulin aspart  0-5 Units Subcutaneous QHS  . insulin aspart  18 Units Subcutaneous TID AC & HS  . insulin glargine  50 Units Subcutaneous BID  . levalbuterol  0.63 mg Nebulization Q6H  . levofloxacin  750 mg Oral Daily  . medroxyPROGESTERone  2.5 mg Oral q morning - 10a  . metFORMIN  1,000 mg Oral BID WC  . methylPREDNISolone (SOLU-MEDROL) injection  60 mg  Intravenous Q8H  . oseltamivir  75 mg Oral BID  . pantoprazole  40 mg Oral q morning - 10a  . risperiDONE  1 mg Oral BID  . sodium chloride  3 mL Intravenous Q12H   Continuous Infusions:    Principal Problem:  *Asthma exacerbation attacks Active Problems:  HIV DISEASE  DIABETES MELLITUS, TYPE II  HYPERLIPIDEMIA  DEPRESSION  HYPERTENSION  Tobacco use disorder  Bronchitis  Influenza A  Hyponatremia        [name]  [facility] Pager [phone]. If 8PM-8AM, please contact night-coverage at [email or web address], password [facility] 12/30/2011, 4:23 PM  LOS: 2 days

## 2011-12-30 NOTE — Progress Notes (Signed)
This patient is receiving IV Levaquin. Based on criteria approved by the Pharmacy and Therapeutics Committee, this medication is being converted to the equivalent oral dose form. These criteria include:   . The patient is eating (either orally or per tube) and/or has been taking other orally administered medications for at least 24 hours.  . This patient has no evidence of active gastrointestinal bleeding or impaired GI absorption (gastrectomy, short bowel, patient on TNA or NPO).   Also, pt noted to be afebrile, WBC WNL with stable renal fxn.    If you have questions about this conversion, please contact the pharmacy department.  Rudean Haskell, [place] 12/30/2011 1:19 PM

## 2011-12-31 DIAGNOSIS — J40 Bronchitis, not specified as acute or chronic: Secondary | ICD-10-CM

## 2011-12-31 DIAGNOSIS — R197 Diarrhea, unspecified: Secondary | ICD-10-CM

## 2011-12-31 LAB — BASIC METABOLIC PANEL
BUN: 24 mg/dL — ABNORMAL HIGH (ref 6–23)
Calcium: 9.2 mg/dL (ref 8.4–10.5)
Chloride: 97 mEq/L (ref 96–112)
Creatinine, Ser: 0.97 mg/dL (ref 0.50–1.10)
GFR calc Af Amer: 79 mL/min — ABNORMAL LOW (ref >90)
GFR calc non Af Amer: 68 mL/min — ABNORMAL LOW (ref >90)

## 2011-12-31 LAB — GLUCOSE, CAPILLARY
Glucose-Capillary: 214 mg/dL — ABNORMAL HIGH (ref 70–99)
Glucose-Capillary: 100 mg/dL — ABNORMAL HIGH (ref 70–99)

## 2011-12-31 MED ORDER — METHYLPREDNISOLONE SODIUM SUCC 125 MG IJ SOLR
60.0000 mg | Freq: Two times a day (BID) | INTRAMUSCULAR | Status: DC
  Administered 2011-12-31 – 2012-01-01 (×2): 60 mg via INTRAVENOUS
  Filled 2011-12-31 (×6): qty 0.96

## 2011-12-31 MED ORDER — OXYCODONE HCL 5 MG PO TABS
5.0000 mg | ORAL_TABLET | Freq: Three times a day (TID) | ORAL | Status: DC | PRN
  Administered 2011-12-31 – 2012-01-01 (×3): 5 mg via ORAL
  Filled 2011-12-31 (×3): qty 1

## 2011-12-31 MED ORDER — INSULIN GLARGINE 100 UNIT/ML ~~LOC~~ SOLN
55.0000 [IU] | Freq: Two times a day (BID) | SUBCUTANEOUS | Status: DC
  Administered 2011-12-31 – 2012-01-01 (×2): 55 [IU] via SUBCUTANEOUS

## 2011-12-31 MED ORDER — CELECOXIB 100 MG PO CAPS
100.0000 mg | ORAL_CAPSULE | Freq: Every day | ORAL | Status: DC
  Filled 2011-12-31 (×2): qty 1

## 2011-12-31 MED ORDER — GUAIFENESIN-DM 100-10 MG/5ML PO SYRP
10.0000 mL | ORAL_SOLUTION | ORAL | Status: DC | PRN
  Administered 2011-12-31 (×3): 10 mL via ORAL
  Filled 2011-12-31 (×4): qty 10

## 2011-12-31 NOTE — Evaluation (Signed)
Physical Therapy One Time Evaluation and D/C from acute PT Patient Details Name: [name] MRN: [id number] DOB: 05-21-1965 Today's Date: 12/31/2011 Time: [id number] PT Time Calculation (min): 13 min  PT Assessment / Plan / Recommendation Clinical Impression  Pt admitted with asthma exacerbation and influenza positive.  Pt reports previous hip pinning surgery that went "bad" and now she will need it fixed.  Pt reports she doesn't have insurance so she would not like therapy until she has that surgery.  Pt supervision level for mobility with RW and normal SaO2 during ambulation however reports dyspnea on exertion.  Pt encouraged to take rest breaks at home as needed.  Pt reports her mother will assist her if needed but is not home today so she cannot d/c until tomorrow.    PT Assessment  Patent does not need any further PT services    Follow Up Recommendations  No PT follow up    Does the patient have the potential to tolerate intense rehabilitation      Barriers to Discharge        Equipment Recommendations  None recommended by PT    Recommendations for Other Services     Frequency      Precautions / Restrictions Precautions Precautions: None   Pertinent Vitals/Pain Reports "bad" hip pain after ambulation, RN notified SaO2 room air at rest 98% SaO2 during ambulation 97% room air SaO2 upon return to room 96% room air     Mobility  Bed Mobility Bed Mobility: Supine to Sit Supine to Sit: 6: Modified independent (Device/Increase time) Transfers Transfers: Stand to Sit;Sit to Stand Sit to Stand: 5: Supervision;From bed Stand to Sit: 5: Supervision;To bed Ambulation/Gait Ambulation/Gait Assistance: 5: Supervision Ambulation Distance (Feet): 160 Feet Assistive device: Rolling walker Ambulation/Gait Assistance Details: increased use of RW due to hip pain, SaO2 on room air 96% however pt reporting mild SOB Gait Pattern: Step-through pattern;Trunk flexed    Shoulder  Instructions     Exercises     PT Diagnosis:    PT Problem List:   PT Treatment Interventions:     PT Goals    Visit Information  Last PT Received On: 12/31/11 Assistance Needed: +1    Subjective Data  Subjective: My mom isn't home today so I can't leave until tomorrow. Patient Stated Goal: therapy after surgery for ?failed hip pinning   Prior Functioning  Home Living Lives With: Other (Comment) (mother) Available Help at Discharge: Available PRN/intermittently Type of Home: House Home Access: Stairs to enter [employer] of Steps: 2-3 Home Layout: One level Home Adaptive Equipment: Walker - rolling;Crutches Prior Function Level of Independence: Independent with assistive device(s) Communication Communication: No difficulties    Cognition  Overall Cognitive Status: Appears within functional limits for tasks assessed/performed Arousal/Alertness: Awake/alert Orientation Level: Appears intact for tasks assessed Behavior During Session: [facility] for tasks performed    Extremity/Trunk Assessment Right Upper Extremity Assessment RUE ROM/Strength/Tone: [facility] for tasks assessed Left Upper Extremity Assessment LUE ROM/Strength/Tone: [facility] for tasks assessed Right Lower Extremity Assessment RLE ROM/Strength/Tone: [facility] for tasks assessed Left Lower Extremity Assessment LLE ROM/Strength/Tone: WFL for tasks assessed   Balance    End of Session PT - End of Session Activity Tolerance: Patient tolerated treatment well Patient left: in bed;with call bell/phone within reach Nurse Communication: Mobility status (pt sitting EOB upon leaving room, sats with ambulation)  GP     [name],[name] 12/31/2011, 3:58 PM Pager: [phone]

## 2011-12-31 NOTE — Progress Notes (Signed)
[facility] PROGRESS NOTE  [name] [id number] DOB: 01/13/1965 DOA: 12/28/2011 PCP: No primary provider on file.  Assessment/Plan: 1. Asthma exacerbation influenza positive: - steroids, nebs, levaquin. , oxygen as needed.  - tamiflu  - started tapering her IV steroids.   2. HIV : resume home medications.   3. Hyponatremia:  Improving.  4. Diabetes Mellitus: CBG (last 3)   Basename 12/31/11 0733 12/30/11 2243 12/30/11 2056  GLUCAP 193* 230* 169*    Resume home medications and SSI.  Increased cbg 's possibly from steroids will increase novolog to 18 units TIDAC and bedtime.   5. dvt prophylaxis  6. Diarrhea: stool for c diff pcr negative. Imodium ordered.   Code Status: full code Family Communication: none at bedside Disposition Plan: POssibly tomorrow.       Antibiotics:  LEVAQUIN  HPI/Subjective: Reports she is feeing better. No fever or chills.   Objective: Filed Vitals:   12/30/11 2053 12/31/11 0140 12/31/11 0459 12/31/11 0955  BP: 103/57  149/76   Pulse: 82 80 81   Temp: 98.2 F (36.8 C)  97.6 F (36.4 C)   TempSrc: Oral  Oral   Resp: 20 18 20    Height:      Weight:      SpO2: 99% 97% 97% 97%    Intake/Output Summary (Last 24 hours) at 12/31/11 1136 Last data filed at 12/31/11 0930  Gross per 24 hour  Intake    243 ml  Output      0 ml  Net    243 ml   Filed Weights   12/28/11 0812  Weight: 111.585 kg (246 lb)    Exam:   General:  Alert afebrile in mild distress  Cardiovascular: s1s2 tachycardic  Respiratory: ctab bilateral exp wheezing heard  Abdomen: soft obese NT ND BS+  Data Reviewed: Basic Metabolic Panel:  Lab [id number] 0424 12/30/11 0428 12/28/11 0910 12/28/11 0418  NA 130* 134* -- 123*  K 4.3 4.7 -- 4.3  CL 97 98 -- 84*  CO2 20 24 -- 27  GLUCOSE 259* 174* -- 213*  BUN 24* 24* -- 13  CREATININE 0.97 0.96 0.77 0.76  CALCIUM 9.2 9.2 -- 9.7  MG -- 1.8 -- --  PHOS -- -- -- --   Liver Function  Tests: No results found for this basename: AST:5,ALT:5,ALKPHOS:5,BILITOT:5,PROT:5,ALBUMIN:5 in the last 168 hours No results found for this basename: LIPASE:5,AMYLASE:5 in the last 168 hours No results found for this basename: AMMONIA:5 in the last 168 hours CBC:  Lab 12/30/11 0428 12/28/11 0910 12/28/11 0418  WBC 8.2 12.2* 13.1*  NEUTROABS -- -- 11.1*  HGB 10.1* 11.2* 12.1  HCT 31.3* 34.9* 36.4  MCV 86.0 83.7 82.7  PLT 361 444* 437*   Cardiac Enzymes: No results found for this basename: CKTOTAL:5,CKMB:5,CKMBINDEX:5,TROPONINI:5 in the last 168 hours BNP (last 3 results) No results found for this basename: PROBNP:3 in the last 8760 hours CBG:  Lab 12/31/11 0733 12/30/11 2243 12/30/11 2056 12/30/11 1559 12/30/11 1157  GLUCAP 193* 230* 169* 134* 204*    Recent Results (from the past 240 hour(s))  CLOSTRIDIUM DIFFICILE BY PCR     Status: Normal   Collection Time   12/28/11  9:06 PM      Component Value Range Status Comment   C difficile by pcr NEGATIVE  NEGATIVE Final      Studies: No results found.  Scheduled Meds:    . atenolol  25 mg Oral q morning - 10a  .  budesonide-formoterol  2 puff Inhalation BID  . carbamazepine  600 mg Oral BID  . docusate sodium  100 mg Oral BID  . efavirenz-emtricitabine-tenofovir  1 tablet Oral QHS  . enoxaparin (LOVENOX) injection  40 mg Subcutaneous Q24H  . estradiol  2 mg Oral q morning - 10a  . fluconazole  100 mg Oral Daily  . gemfibrozil  600 mg Oral BID AC  . glipiZIDE  10 mg Oral BID AC  . insulin aspart  0-20 Units Subcutaneous TID WC  . insulin aspart  0-5 Units Subcutaneous QHS  . insulin aspart  18 Units Subcutaneous TID AC & HS  . insulin glargine  50 Units Subcutaneous BID  . levalbuterol  0.63 mg Nebulization Q6H  . levofloxacin  750 mg Oral Daily  . medroxyPROGESTERone  2.5 mg Oral q morning - 10a  . metFORMIN  1,000 mg Oral BID WC  . methylPREDNISolone (SOLU-MEDROL) injection  60 mg Intravenous Q12H  . oseltamivir  75  mg Oral BID  . pantoprazole  40 mg Oral q morning - 10a  . risperiDONE  1 mg Oral BID  . sodium chloride  3 mL Intravenous Q12H   Continuous Infusions:    Principal Problem:  *Asthma exacerbation attacks Active Problems:  HIV DISEASE  DIABETES MELLITUS, TYPE II  HYPERLIPIDEMIA  DEPRESSION  HYPERTENSION  Tobacco use disorder  Bronchitis  Influenza A  Hyponatremia        [name]  [facility] Pager [phone]. If 8PM-8AM, please contact night-coverage at [email or web address], password [facility] 12/31/2011, 11:36 AM  LOS: 3 days

## 2012-01-01 ENCOUNTER — Inpatient Hospital Stay (HOSPITAL_COMMUNITY): Payer: Self-pay

## 2012-01-01 LAB — BASIC METABOLIC PANEL
BUN: 24 mg/dL — ABNORMAL HIGH (ref 6–23)
Creatinine, Ser: 0.96 mg/dL (ref 0.50–1.10)
GFR calc Af Amer: 80 mL/min — ABNORMAL LOW (ref >90)
GFR calc non Af Amer: 69 mL/min — ABNORMAL LOW (ref >90)
Glucose, Bld: 199 mg/dL — ABNORMAL HIGH (ref 70–99)

## 2012-01-01 LAB — GLUCOSE, CAPILLARY: Glucose-Capillary: 277 mg/dL — ABNORMAL HIGH (ref 70–99)

## 2012-01-01 MED ORDER — DIAZEPAM 5 MG PO TABS: 2.5000 mg | ORAL_TABLET | Freq: Every evening | ORAL | Status: DC | PRN

## 2012-01-01 MED ORDER — ALBUTEROL SULFATE (2.5 MG/3ML) 0.083% IN NEBU: 2.5000 mg | INHALATION_SOLUTION | Freq: Four times a day (QID) | RESPIRATORY_TRACT | Status: DC | PRN

## 2012-01-01 MED ORDER — BUDESONIDE-FORMOTEROL FUMARATE 80-4.5 MCG/ACT IN AERO: 2.0000 | INHALATION_SPRAY | Freq: Two times a day (BID) | RESPIRATORY_TRACT | Status: DC

## 2012-01-01 MED ORDER — OSELTAMIVIR PHOSPHATE 75 MG PO CAPS: 75.0000 mg | ORAL_CAPSULE | Freq: Two times a day (BID) | ORAL | Status: DC

## 2012-01-01 MED ORDER — HYDROCODONE-ACETAMINOPHEN 10-650 MG PO TABS: 1.0000 | ORAL_TABLET | ORAL | Status: DC | PRN

## 2012-01-01 MED ORDER — LEVOFLOXACIN 750 MG PO TABS: 750.0000 mg | ORAL_TABLET | Freq: Every day | ORAL | Status: DC

## 2012-01-01 MED ORDER — DSS 100 MG PO CAPS: 100.0000 mg | ORAL_CAPSULE | Freq: Two times a day (BID) | ORAL | Status: DC

## 2012-01-01 MED ORDER — INSULIN ASPART 100 UNIT/ML ~~LOC~~ SOLN: 0.0000 [IU] | Freq: Three times a day (TID) | SUBCUTANEOUS | Status: DC

## 2012-01-01 MED ORDER — LEVALBUTEROL HCL 0.63 MG/3ML IN NEBU
0.6300 mg | INHALATION_SOLUTION | RESPIRATORY_TRACT | Status: DC | PRN
  Filled 2012-01-01: qty 3

## 2012-01-01 MED ORDER — PREDNISONE (PAK) 10 MG PO TABS: 10.0000 mg | ORAL_TABLET | Freq: Every day | ORAL | Status: DC

## 2012-01-01 MED ORDER — LEVALBUTEROL HCL 0.63 MG/3ML IN NEBU
0.6300 mg | INHALATION_SOLUTION | Freq: Three times a day (TID) | RESPIRATORY_TRACT | Status: DC
  Filled 2012-01-01 (×2): qty 3

## 2012-01-01 MED ORDER — DM-GUAIFENESIN ER 30-600 MG PO TB12: 1.0000 | ORAL_TABLET | Freq: Two times a day (BID) | ORAL | Status: DC

## 2012-01-01 MED ORDER — DM-GUAIFENESIN ER 30-600 MG PO TB12
1.0000 | ORAL_TABLET | Freq: Two times a day (BID) | ORAL | Status: DC
  Administered 2012-01-01: 1 via ORAL
  Filled 2012-01-01 (×2): qty 1

## 2012-01-01 NOTE — Discharge Summary (Signed)
Physician Discharge Summary  [name] [id number] DOB: 09/29/1965 DOA: 12/28/2011  PCP: No primary provider on file.  Admit date: 12/28/2011 Discharge date: 01/01/2012  Time spent: 32 minutes  Recommendations for Outpatient Follow-up:  1. Follow up withPCP in 2 weeks.   Discharge Diagnoses:  Principal Problem:  *Asthma exacerbation attacks Active Problems:  HIV DISEASE  DIABETES MELLITUS, TYPE II  HYPERLIPIDEMIA  DEPRESSION  HYPERTENSION  Tobacco use disorder  Bronchitis  Influenza A  Hyponatremia   Discharge Condition: stable  Diet recommendation: low sodium diet  Filed Weights   12/28/11 0812  Weight: 111.585 kg (246 lb)    History of present illness:  [name] is an 46 y.o. female HIV positive on Atripla, asthma on steroid inhaler and as needed albuterol, DM2, HTN, depression and anxiety, presents to the ER with 3 days hx of coughs, shortness of breath and wheezing. Her mother had a bronchitis as well. She did have her flu shot this year, but did complain of diffuse muscle aches. She has subjective fever and chills. Evaluation in the ER included a CXR with no infiltrate, a WBC of 13K, Hb 11g/DL, and normal renal fx tests, and Na 123. She was given nebs, steroids, IV Rocephin, and hospitalist was asked to admit her as she still was having significant wheezing.   Hospital Course:    1. Asthma exacerbation/ influenza A positive: She was started on IV Steroids, nebs, oxygen and levaquin and  tamiflu  She improved greatly. And she is being discharged on oral steroids tapered over the next 2 weeks, levaquin and tamiflu.  2. HIV : resume home medications.  3. Hyponatremia: Improving.  4. Diabetes Mellitus:  CBG (last 3)   Basename  12/31/11 0733  12/30/11 2243  12/30/11 2056   GLUCAP  193*  230*  169*    Resume home medications and SSI.  5. Chronic hip pain: pain control and she has follow up appointment with ortho on Jan 13th. 6. Diarrhea:  stool for c diff pcr negative. Imodium ordered. Diarrhea resolved.   Discharge Exam: Filed Vitals:   12/31/11 2017 12/31/11 2056 01/01/12 0532 01/01/12 0754  BP:  119/65 159/80   Pulse:  72 77   Temp:  98 F (36.7 C) 98 F (36.7 C)   TempSrc:  Oral Oral   Resp:  19 18   Height:      Weight:      SpO2: 96% 99% 100% 95%   General: Alert afebrile in no  distress  Cardiovascular: s1s2 tachycardic  Respiratory: good air entry bilateral. Scattered wheezing heard.  Abdomen: soft obese NT ND BS+   Discharge Instructions  Discharge Orders    Future Appointments: Provider: Department: Dept Phone: Center:   01/16/2012 1:30 PM Lavonia Drafts, MD [facility] [phone] [facility]     Future Orders Please Complete By Expires   Diet general      Discharge instructions      Comments:   Please follow up with PCP in 2 weeks before the steroids are completed.   Activity as tolerated - No restrictions          Medication List     As of 01/01/2012 12:17 PM    STOP taking these medications         ibuprofen 800 MG tablet   Commonly known as: ADVIL,MOTRIN      traMADol 50 MG tablet   Commonly known as: ULTRAM      TAKE these medications  albuterol (2.5 MG/3ML) 0.083% nebulizer solution   Commonly known as: PROVENTIL   Take 3 mLs (2.5 mg total) by nebulization every 6 (six) hours as needed. For wheezing or shortness of breath      amitriptyline 50 MG tablet   Commonly known as: ELAVIL   Take 50 mg by mouth at bedtime.      atenolol 25 MG tablet   Commonly known as: TENORMIN   Take 25 mg by mouth every morning.      budesonide-formoterol 80-4.5 MCG/ACT inhaler   Commonly known as: SYMBICORT   Inhale 2 puffs into the lungs 2 (two) times daily.      carbamazepine 200 MG 12 hr tablet   Commonly known as: TEGRETOL XR   Take 600 mg by mouth 2 (two) times daily.      dextromethorphan-guaiFENesin 30-600 MG per 12 hr tablet   Commonly known as: MUCINEX DM     Take 1 tablet by mouth 2 (two) times daily.      diazepam 5 MG tablet   Commonly known as: VALIUM   Take 0.5 tablets (2.5 mg total) by mouth at bedtime as needed.      DSS 100 MG Caps   Take 100 mg by mouth 2 (two) times daily.      efavirenz-emtricitabine-tenofovir 600-200-300 MG per tablet   Commonly known as: ATRIPLA   Take 1 tablet by mouth at bedtime.      estradiol 2 MG tablet   Commonly known as: ESTRACE   Take 2 mg by mouth every morning.      fish oil-omega-3 fatty acids 1000 MG capsule   Take 1 g by mouth 2 (two) times daily.      fluconazole 100 MG tablet   Commonly known as: DIFLUCAN   Take 100 mg by mouth daily.      gemfibrozil 600 MG tablet   Commonly known as: LOPID   Take 600 mg by mouth 2 (two) times daily before a meal. 1/2 hour before meals      glipiZIDE 10 MG tablet   Commonly known as: GLUCOTROL   Take 10 mg by mouth 2 (two) times daily before a meal.      hydrochlorothiazide 25 MG tablet   Commonly known as: HYDRODIURIL   Take 25 mg by mouth every morning.      HYDROcodone-acetaminophen 10-650 MG per tablet   Commonly known as: LORCET   Take 1 tablet by mouth every 4 (four) hours as needed. Take 1 tablet every 4-6 hours as needed for hip pain      insulin aspart 100 UNIT/ML injection   Commonly known as: novoLOG   Inject 0-20 Units into the skin 3 (three) times daily with meals.      insulin aspart 100 UNIT/ML injection   Commonly known as: novoLOG   Inject 15 Units into the skin at bedtime.      insulin glargine 100 UNIT/ML injection   Commonly known as: LANTUS   Inject 50 Units into the skin 2 (two) times daily.      levofloxacin 750 MG tablet   Commonly known as: LEVAQUIN   Take 1 tablet (750 mg total) by mouth daily.      medroxyPROGESTERone 2.5 MG tablet   Commonly known as: PROVERA   Take 2.5 mg by mouth every morning. Days 1-5 of the cycle      metFORMIN 1000 MG tablet   Commonly known as: GLUCOPHAGE   Take 1,000 mg by  mouth 2 (two) times daily with a meal.      methocarbamol 500 MG tablet   Commonly known as: ROBAXIN   Take 500 mg by mouth every 6 (six) hours as needed. Every 6-8 hours as needed for muscle spasms      multivitamin with minerals tablet   Take 1 tablet by mouth daily.      oseltamivir 75 MG capsule   Commonly known as: TAMIFLU   Take 1 capsule (75 mg total) by mouth 2 (two) times daily.      pantoprazole 40 MG tablet   Commonly known as: PROTONIX   Take 40 mg by mouth every morning.      predniSONE 10 MG tablet   Commonly known as: STERAPRED UNI-PAK   Take 1 tablet (10 mg total) by mouth daily. Prednisone 60 mg daily for 3 days followed by  Prednisone 40 mg daily for 3 days followed by   Prednisone 30 mg daily for 3 days followed by   Prednisone 20 mg daily for 3 days followed by  Prednisone 10 mg daily for 3 days.      risperiDONE 1 MG tablet   Commonly known as: RISPERDAL   Take 1 mg by mouth 2 (two) times daily.      vitamin E 1000 UNIT capsule   Generic drug: vitamin E   Take 5,000 Units by mouth 2 (two) times daily.           Follow-up Information    Follow up with [place], [place]. In 2 weeks.          The results of significant diagnostics from this hospitalization (including imaging, microbiology, ancillary and laboratory) are listed below for reference.    Significant Diagnostic Studies: Dg Chest 2 View  01/01/2012  *RADIOLOGY REPORT*  Clinical Data: Cough  CHEST - 2 VIEW  Comparison: 12/28/2011  Findings: The heart size and mediastinal contours are within normal limits.  Both lungs are clear.  The visualized skeletal structures are unremarkable.  IMPRESSION: Negative exam.   Original Report Authenticated By: Kerby Moors, M.D.    Dg Chest 2 View  12/28/2011  *RADIOLOGY REPORT*  Clinical Data: Cough and chest pain.  CHEST - 2 VIEW  Comparison: Chest radiograph performed 12/03/2008  Findings: The lungs are well-aerated and clear.  There is no  evidence of focal opacification, pleural effusion or pneumothorax.  The heart is borderline enlarged.  No acute osseous abnormalities are seen.  IMPRESSION: No acute cardiopulmonary process seen; borderline cardiomegaly noted.   Original Report Authenticated By: Santa Lighter, M.D.     Microbiology: Recent Results (from the past 240 hour(s))  CLOSTRIDIUM DIFFICILE BY PCR     Status: Normal   Collection Time   12/28/11  9:06 PM      Component Value Range Status Comment   C difficile by pcr NEGATIVE  NEGATIVE Final      Labs: Basic Metabolic Panel:  Lab [id number] 12/31/11 0424 12/30/11 0428 12/28/11 0910 12/28/11 0418  NA 132* 130* 134* -- 123*  K 3.8 4.3 4.7 -- 4.3  CL 99 97 98 -- 84*  CO2 23 20 24  -- 27  GLUCOSE 199* 259* 174* -- 213*  BUN 24* 24* 24* -- 13  CREATININE 0.96 0.97 0.96 0.77 0.76  CALCIUM 9.0 9.2 9.2 -- 9.7  MG -- -- 1.8 -- --  PHOS -- -- -- -- --   Liver Function Tests: No results found for this basename: AST:5,ALT:5,ALKPHOS:5,BILITOT:5,PROT:5,ALBUMIN:5 in the last  168 hours No results found for this basename: LIPASE:5,AMYLASE:5 in the last 168 hours No results found for this basename: AMMONIA:5 in the last 168 hours CBC:  Lab 12/30/11 0428 12/28/11 0910 12/28/11 0418  WBC 8.2 12.2* 13.1*  NEUTROABS -- -- 11.1*  HGB 10.1* 11.2* 12.1  HCT 31.3* 34.9* 36.4  MCV 86.0 83.7 82.7  PLT 361 444* 437*   Cardiac Enzymes: No results found for this basename: CKTOTAL:5,CKMB:5,CKMBINDEX:5,TROPONINI:5 in the last 168 hours BNP: BNP (last 3 results) No results found for this basename: PROBNP:3 in the last 8760 hours CBG:  Lab 01/01/12 1157 01/01/12 0829 12/31/11 2103 12/31/11 1656 12/31/11 1139  GLUCAP 277* 177* 106* 102* 123*       Signed:  Chipper Koudelka  [facility] 01/01/2012, 12:17 PM

## 2012-01-04 ENCOUNTER — Ambulatory Visit: Payer: No Typology Code available for payment source | Admitting: Obstetrics and Gynecology

## 2012-01-16 ENCOUNTER — Ambulatory Visit: Payer: No Typology Code available for payment source | Admitting: Obstetrics & Gynecology

## 2012-01-30 ENCOUNTER — Other Ambulatory Visit: Payer: Self-pay | Admitting: *Deleted

## 2012-01-30 ENCOUNTER — Ambulatory Visit: Payer: No Typology Code available for payment source | Admitting: Family Medicine

## 2012-01-30 NOTE — Telephone Encounter (Signed)
[name] called and left a message stating she had an appointment today and couldn't come due to ice on her driveways and roads and rescheduled it. States she was wondering if we could call her in 15 days of her estrace to last until her next appt 02/13/12.  States changed her pharmacy to [facility] on [address]

## 2012-01-31 MED ORDER — ESTRADIOL 2 MG PO TABS: 2.0000 mg | ORAL_TABLET | Freq: Every morning | ORAL | Status: DC

## 2012-01-31 MED ORDER — MEDROXYPROGESTERONE ACETATE 2.5 MG PO TABS: 2.5000 mg | ORAL_TABLET | Freq: Every morning | ORAL | Status: DC

## 2012-01-31 NOTE — Telephone Encounter (Signed)
Discussed with Dr. Maylene RoesTamala Julian and called patient and she states she is still taking both provera and estradiol as instructed- informed her we will send 14 days worth to last until 02/13/12 appt.

## 2012-02-12 ENCOUNTER — Other Ambulatory Visit: Payer: Self-pay | Admitting: Orthopedic Surgery

## 2012-02-12 DIAGNOSIS — S72009D Fracture of unspecified part of neck of unspecified femur, subsequent encounter for closed fracture with routine healing: Secondary | ICD-10-CM

## 2012-02-12 NOTE — Progress Notes (Signed)
This encounter was created in error - please disregard.

## 2012-02-13 ENCOUNTER — Encounter: Payer: Self-pay | Admitting: Family Medicine

## 2012-02-13 ENCOUNTER — Ambulatory Visit (INDEPENDENT_AMBULATORY_CARE_PROVIDER_SITE_OTHER): Payer: Self-pay | Admitting: Family Medicine

## 2012-02-13 VITALS — BP 124/79 | HR 75 | Temp 97.9°F | Ht 70.0 in | Wt 252.2 lb

## 2012-02-13 DIAGNOSIS — N951 Menopausal and female climacteric states: Secondary | ICD-10-CM | POA: Insufficient documentation

## 2012-02-13 HISTORY — DX: Menopausal and female climacteric states: N95.1

## 2012-02-13 MED ORDER — MEDROXYPROGESTERONE ACETATE 2.5 MG PO TABS: 2.5000 mg | ORAL_TABLET | Freq: Every morning | ORAL | Status: DC

## 2012-02-13 MED ORDER — ESTRADIOL 2 MG PO TABS: 2.0000 mg | ORAL_TABLET | Freq: Every morning | ORAL | Status: DC

## 2012-02-13 NOTE — Progress Notes (Signed)
Patient ID: [name], female   DOB: 02-10-1965, 47 y.o.   MRN: [id number]  S:  [name] is a 47 y.o. menopausal female here for refill of Estrace and Provera. She states that she has been on these medications for around 2 years for perimenopausal symptoms, including hot flashes and mood swings. Her LMP was in 2012, and she has had no irregular bleeding since then. She feels like her hot flashes and mood swings are getting worse again recently despite the HRT with severe hot flashes 2-3 times a day.  She was asked to come in for an annual exam in order to get her refills. However, she recently had a hip replacement and is unable to walk easily, much less tolerate a pelvic exam in lithotomy position. Pt states she has a CT next week for hip and a second surgery is planned in a few months. She hopes to be more mobile after this second surgery.   She states she was getting her paps done at [facility] until recently. Her last pap smear was a little over a year ago and was normal. She denies any abnormal pap smears. She does have HIV, is on Atripla and sees Dr. Johnnye Sima for monitoring. Most recent viral load July 2013 <20. Most recent CD4 count 12/28/11 was 30. Poorly controlled diabetes on insulin. HTN controlled with HCTZ and atenolol. States had mammogram this year, normal.  Pt sees PCP and psychiatrist at [facility] for management of diabetes, HTN, other chronic diseases and depression. She is on risperdal for depression but would like to change since her mood is not well controlled. She sees her psychiatrist mid-February.  Pt smokes less than 1/2 PPD cigarettes and is working to cut back more.  Past Medical History  Diagnosis Date  . Anxiety   . Depression   . Diabetes mellitus   . Hypertension   . Asthma   . HIV infection 2000    ROS:  No fever/chills, no nausea/vomiting. No dysuria/vaginal discharge/bleeding/abdominal pain.  O: Filed Vitals:   02/13/12 1329  BP: 124/79   Pulse: 75  Temp: 97.9 F (36.6 C)    PHYSICAL EXAM GEN: WNWD, no acute distress, ambulates with aid of crutches but weight bearing. HEENT:  EOMI, NCAT CV/RESP:  Regular rate, no resp distress NEURO: alert, oriented  A/P 47 y.o. female with DM, HIV, depression and perimenopausal symptoms - Unable to do annual exam due to hip fracture and poor mobility. - Will refill estrace and provera for 3 months pending next surgery and hope to do pap then. - Will coordinate with Dr. Johnnye Sima (ID) and her PCP regarding risk factors, prior pap smears if he has records. - Pt encouraged to discuss other meds with psych that may help menopausal symptoms such as Effexor or SSRI. Also encouraged to discuss gabapentin with PCP as may also be helpful, and she has been on it before for neuropathy. F/U in 3 months  Martha Clan, MD

## 2012-02-18 ENCOUNTER — Inpatient Hospital Stay: Admission: RE | Admit: 2012-02-18 | Payer: Self-pay | Source: Ambulatory Visit

## 2012-02-18 ENCOUNTER — Ambulatory Visit
Admission: RE | Admit: 2012-02-18 | Discharge: 2012-02-18 | Disposition: A | Discharge status: Home / Self Care | Payer: No Typology Code available for payment source | Source: Ambulatory Visit | Attending: Orthopedic Surgery | Admitting: Orthopedic Surgery

## 2012-02-18 DIAGNOSIS — S72009D Fracture of unspecified part of neck of unspecified femur, subsequent encounter for closed fracture with routine healing: Secondary | ICD-10-CM

## 2012-02-25 ENCOUNTER — Ambulatory Visit: Payer: Self-pay

## 2012-02-29 ENCOUNTER — Other Ambulatory Visit: Payer: Self-pay | Admitting: Orthopedic Surgery

## 2012-02-29 MED ORDER — DEXAMETHASONE SODIUM PHOSPHATE 10 MG/ML IJ SOLN: 10.0000 mg | Freq: Once | INTRAMUSCULAR | Status: DC

## 2012-02-29 NOTE — Progress Notes (Signed)
Preoperative surgical orders have been place into the Epic hospital system for [place] on 02/29/2012, 2:19 PM  by Mickel Crow for surgery on 04/09/2012.  Preop hip orders including IV Tylenol and IV Decadron as long as there are no contraindications to the above medications. Arlee Muslim, PA-C

## 2012-04-03 NOTE — Patient Instructions (Signed)
[name]  04/03/2012   Your procedure is scheduled on:  04/09/12   Report to [facility] at     12noon  AM.  Call this number if you have problems the morning of surgery: [phone]   Remember:   Do not eat food after midnite.  May have clear liquids until 0800am then npo.     Take these medicines the morning of surgery with A SIP OF WATER:    Do not wear jewelry, make-up or nail polish.  Do not wear lotions, powders, or perfumes.   Do not shave 48 hours prior to surgery.  Do not bring valuables to the hospital.  Contacts, dentures or bridgework may not be worn into surgery.  Leave suitcase in the car. After surgery it may be brought to your room.  For patients admitted to the hospital, checkout time is 11:00 AM the day of  discharge.      SEE CHG INSTRUCTION SHEET    Please read over the following fact sheets that you were given: MRSA Information, coughing and deep breathing exercises, leg exercises               Failure to comply with these instructions may result in cancellation of your surgery.                Patient Signature ____________________________              Nurse Signature _____________________________

## 2012-04-04 ENCOUNTER — Inpatient Hospital Stay (HOSPITAL_COMMUNITY)
Admission: RE | Admit: 2012-04-04 | Discharge: 2012-04-04 | Disposition: A | Discharge status: Home / Self Care | Payer: No Typology Code available for payment source | Source: Ambulatory Visit

## 2012-04-04 ENCOUNTER — Encounter (HOSPITAL_COMMUNITY): Payer: Self-pay | Admitting: Pharmacy Technician

## 2012-04-07 ENCOUNTER — Encounter (HOSPITAL_COMMUNITY)
Admission: RE | Admit: 2012-04-07 | Discharge: 2012-04-07 | Disposition: A | Discharge status: Home / Self Care | Payer: No Typology Code available for payment source | Source: Ambulatory Visit | Attending: Orthopedic Surgery | Admitting: Orthopedic Surgery

## 2012-04-07 ENCOUNTER — Encounter (HOSPITAL_COMMUNITY): Payer: Self-pay

## 2012-04-07 HISTORY — DX: Shortness of breath: R06.02

## 2012-04-07 HISTORY — DX: Myoneural disorder, unspecified: G70.9

## 2012-04-07 HISTORY — DX: Unspecified osteoarthritis, unspecified site: M19.90

## 2012-04-07 HISTORY — DX: Pain, unspecified: R52

## 2012-04-07 HISTORY — DX: Gastro-esophageal reflux disease without esophagitis: K21.9

## 2012-04-07 LAB — CBC
Hemoglobin: 12.1 g/dL (ref 12.0–15.0)
Platelets: 395 10*3/uL (ref 150–400)
RBC: 4.31 MIL/uL (ref 3.87–5.11)

## 2012-04-07 LAB — COMPREHENSIVE METABOLIC PANEL
ALT: 15 U/L (ref 0–35)
AST: 12 U/L (ref 0–37)
Alkaline Phosphatase: 80 U/L (ref 39–117)
CO2: 23 mEq/L (ref 19–32)
GFR calc Af Amer: 68 mL/min — ABNORMAL LOW (ref >90)
GFR calc non Af Amer: 59 mL/min — ABNORMAL LOW (ref >90)
Glucose, Bld: 322 mg/dL — ABNORMAL HIGH (ref 70–99)
Potassium: 4.2 mEq/L (ref 3.5–5.1)
Sodium: 127 mEq/L — ABNORMAL LOW (ref 135–145)
Total Protein: 7.6 g/dL (ref 6.0–8.3)

## 2012-04-07 LAB — PROTIME-INR: INR: 1.04 (ref 0.00–1.49)

## 2012-04-07 LAB — SURGICAL PCR SCREEN
MRSA, PCR: NEGATIVE
Staphylococcus aureus: POSITIVE — AB

## 2012-04-07 NOTE — Patient Instructions (Signed)
YOUR SURGERY IS SCHEDULED AT [facility]  ON:  WED  4/9  REPORT TO [place] SHORT STAY CENTER AT:  12:00 PM      PHONE # FOR SHORT STAY IS [phone]  DO NOT EAT ANYTHING AFTER MIDNIGHT THE NIGHT BEFORE YOUR SURGERY.   NO FOOD, NO CHEWING GUM, NO MINTS, NO CANDIES, NO CHEWING TOBACCO.  YOU MAY HAVE CLEAR LIQUIDS TO DRINK FROM MIDNIGHT UNTIL 8:30 AM DAY OF SURGERY --LIKE WATER, ICE TEA.   NOTHING TO DRINK AFTER 8:30 AM DAY OF SURGERY.  PLEASE TAKE THE FOLLOWING MEDICATIONS THE AM OF YOUR SURGERY WITH A FEW SIPS OF WATER:   ATENOLOL, TEGRETOL, VALIUM, ESTRACE, DIFLUCAN, NEURONTIN, PROTONIX, RISPERDOL, AND MAY TAKE PAIN MEDICATION HYDROCODONE / ACETAMINOPHEN IF NEEDED FOR PAIN. USE YOUR NEBULIZER.  USE YOUR INHALER.  IF YOU USE INHALERS--USE YOUR INHALERS THE AM OF YOUR SURGERY AND BRING INHALERS TO [place].    IF YOU ARE DIABETIC:  DO NOT TAKE ANY DIABETIC MEDICATIONS THE AM OF YOUR SURGERY.  IF YOU TAKE INSULIN IN THE EVENINGS--PLEASE ONLY TAKE 1/2 NORMAL EVENING DOSE THE NIGHT BEFORE YOUR SURGERY.  NO INSULIN THE AM OF YOUR SURGERY.    DO NOT BRING VALUABLES, MONEY, CREDIT CARDS.  DO NOT WEAR JEWELRY, MAKE-UP, NAIL POLISH AND NO METAL PINS OR CLIPS IN YOUR HAIR. CONTACT LENS, DENTURES / PARTIALS, GLASSES SHOULD NOT BE WORN TO SURGERY AND IN MOST CASES-HEARING AIDS WILL NEED TO BE REMOVED.  BRING YOUR GLASSES CASE, ANY EQUIPMENT NEEDED FOR YOUR CONTACT LENS. FOR PATIENTS ADMITTED TO THE HOSPITAL--CHECK OUT TIME THE DAY OF DISCHARGE IS 11:00 AM.  ALL INPATIENT ROOMS ARE PRIVATE - WITH BATHROOM, TELEPHONE, TELEVISION AND WIFI INTERNET.                              PLEASE READ OVER ANY  FACT SHEETS THAT YOU WERE GIVEN: MRSA INFORMATION, BLOOD TRANSFUSION INFORMATION, INCENTIVE SPIROMETER INFORMATION. FAILURE TO FOLLOW THESE INSTRUCTIONS MAY RESULT IN THE CANCELLATION OF YOUR SURGERY.   PATIENT SIGNATURE_________________________________

## 2012-04-07 NOTE — Pre-Procedure Instructions (Signed)
CXR REPORT 01/01/12 AND EKG REPORT 07/16/11 ARE IN EPIC.

## 2012-04-08 NOTE — Pre-Procedure Instructions (Signed)
PT'S PREOP GLUCOSE 322, SODIUM 127, BUN 28, PTT 41.  COPIES OF PREOP CMET AND PTT REPORTS FAXED TO DR. ALUISIO'S OFFICE FOR REVIEW ( PT, INR ALSO FAXED-IT WAS W/IN NORMAL LIMITS ).

## 2012-04-09 ENCOUNTER — Encounter (HOSPITAL_COMMUNITY): Payer: Self-pay | Admitting: Anesthesiology

## 2012-04-09 ENCOUNTER — Observation Stay (HOSPITAL_COMMUNITY)
Admission: RE | Admit: 2012-04-09 | Discharge: 2012-04-10 | Disposition: A | Discharge status: Home / Self Care | Payer: No Typology Code available for payment source | Source: Ambulatory Visit | Attending: Orthopedic Surgery | Admitting: Orthopedic Surgery

## 2012-04-09 ENCOUNTER — Ambulatory Visit (HOSPITAL_COMMUNITY): Payer: No Typology Code available for payment source | Admitting: Anesthesiology

## 2012-04-09 ENCOUNTER — Ambulatory Visit (HOSPITAL_COMMUNITY): Payer: No Typology Code available for payment source

## 2012-04-09 ENCOUNTER — Encounter (HOSPITAL_COMMUNITY)
Admission: RE | Disposition: A | Discharge status: Home / Self Care | Payer: Self-pay | Source: Ambulatory Visit | Attending: Orthopedic Surgery

## 2012-04-09 ENCOUNTER — Encounter (HOSPITAL_COMMUNITY): Payer: Self-pay

## 2012-04-09 DIAGNOSIS — Y838 Other surgical procedures as the cause of abnormal reaction of the patient, or of later complication, without mention of misadventure at the time of the procedure: Secondary | ICD-10-CM | POA: Insufficient documentation

## 2012-04-09 DIAGNOSIS — F3289 Other specified depressive episodes: Secondary | ICD-10-CM | POA: Insufficient documentation

## 2012-04-09 DIAGNOSIS — M25559 Pain in unspecified hip: Secondary | ICD-10-CM | POA: Insufficient documentation

## 2012-04-09 DIAGNOSIS — F329 Major depressive disorder, single episode, unspecified: Secondary | ICD-10-CM | POA: Insufficient documentation

## 2012-04-09 DIAGNOSIS — F411 Generalized anxiety disorder: Secondary | ICD-10-CM | POA: Insufficient documentation

## 2012-04-09 DIAGNOSIS — Z21 Asymptomatic human immunodeficiency virus [HIV] infection status: Secondary | ICD-10-CM | POA: Insufficient documentation

## 2012-04-09 DIAGNOSIS — T8489XA Other specified complication of internal orthopedic prosthetic devices, implants and grafts, initial encounter: Principal | ICD-10-CM | POA: Insufficient documentation

## 2012-04-09 DIAGNOSIS — T8484XA Pain due to internal orthopedic prosthetic devices, implants and grafts, initial encounter: Secondary | ICD-10-CM

## 2012-04-09 DIAGNOSIS — Z01812 Encounter for preprocedural laboratory examination: Secondary | ICD-10-CM | POA: Insufficient documentation

## 2012-04-09 DIAGNOSIS — J45909 Unspecified asthma, uncomplicated: Secondary | ICD-10-CM | POA: Insufficient documentation

## 2012-04-09 DIAGNOSIS — Z79899 Other long term (current) drug therapy: Secondary | ICD-10-CM | POA: Insufficient documentation

## 2012-04-09 DIAGNOSIS — I10 Essential (primary) hypertension: Secondary | ICD-10-CM | POA: Insufficient documentation

## 2012-04-09 DIAGNOSIS — F17200 Nicotine dependence, unspecified, uncomplicated: Secondary | ICD-10-CM | POA: Insufficient documentation

## 2012-04-09 DIAGNOSIS — K219 Gastro-esophageal reflux disease without esophagitis: Secondary | ICD-10-CM | POA: Insufficient documentation

## 2012-04-09 DIAGNOSIS — E119 Type 2 diabetes mellitus without complications: Secondary | ICD-10-CM | POA: Insufficient documentation

## 2012-04-09 HISTORY — PX: HIP PINNING,CANNULATED: SHX1758

## 2012-04-09 LAB — GLUCOSE, CAPILLARY
Glucose-Capillary: 134 mg/dL — ABNORMAL HIGH (ref 70–99)
Glucose-Capillary: 99 mg/dL (ref 70–99)

## 2012-04-09 LAB — TYPE AND SCREEN

## 2012-04-09 SURGERY — FIXATION, FEMUR, NECK, PERCUTANEOUS, USING SCREW
Anesthesia: General | Site: Hip | Laterality: Left | Wound class: Clean

## 2012-04-09 MED ORDER — CARBAMAZEPINE ER 400 MG PO TB12
600.0000 mg | ORAL_TABLET | Freq: Two times a day (BID) | ORAL | Status: DC
  Filled 2012-04-09: qty 1

## 2012-04-09 MED ORDER — LACTATED RINGERS IV SOLN
INTRAVENOUS | Status: DC
  Administered 2012-04-09: 14:00:00 via INTRAVENOUS

## 2012-04-09 MED ORDER — CHLORHEXIDINE GLUCONATE 4 % EX LIQD: 60.0000 mL | Freq: Once | CUTANEOUS | Status: DC

## 2012-04-09 MED ORDER — CARBAMAZEPINE ER 200 MG PO TB12
600.0000 mg | ORAL_TABLET | Freq: Two times a day (BID) | ORAL | Status: DC
  Administered 2012-04-09 – 2012-04-10 (×2): 600 mg via ORAL
  Filled 2012-04-09 (×3): qty 3

## 2012-04-09 MED ORDER — SODIUM CHLORIDE 0.9 % IV SOLN: INTRAVENOUS | Status: DC

## 2012-04-09 MED ORDER — LIDOCAINE HCL (PF) 2 % IJ SOLN
INTRAMUSCULAR | Status: DC | PRN
  Administered 2012-04-09: 20 mg

## 2012-04-09 MED ORDER — METHOCARBAMOL 500 MG PO TABS
500.0000 mg | ORAL_TABLET | Freq: Four times a day (QID) | ORAL | Status: DC | PRN
  Administered 2012-04-09 – 2012-04-10 (×2): 500 mg via ORAL
  Filled 2012-04-09 (×2): qty 1

## 2012-04-09 MED ORDER — HYDROMORPHONE HCL PF 1 MG/ML IJ SOLN
INTRAMUSCULAR | Status: AC
  Filled 2012-04-09: qty 1

## 2012-04-09 MED ORDER — INSULIN GLARGINE 100 UNIT/ML ~~LOC~~ SOLN
50.0000 [IU] | Freq: Two times a day (BID) | SUBCUTANEOUS | Status: DC
  Administered 2012-04-09 – 2012-04-10 (×2): 50 [IU] via SUBCUTANEOUS
  Filled 2012-04-09 (×3): qty 0.5

## 2012-04-09 MED ORDER — FENTANYL CITRATE 0.05 MG/ML IJ SOLN
25.0000 ug | INTRAMUSCULAR | Status: DC | PRN
  Administered 2012-04-09 (×3): 50 ug via INTRAVENOUS

## 2012-04-09 MED ORDER — INSULIN ASPART 100 UNIT/ML ~~LOC~~ SOLN
15.0000 [IU] | Freq: Three times a day (TID) | SUBCUTANEOUS | Status: DC
  Administered 2012-04-10: 15 [IU] via SUBCUTANEOUS

## 2012-04-09 MED ORDER — METFORMIN HCL 500 MG PO TABS
1000.0000 mg | ORAL_TABLET | Freq: Two times a day (BID) | ORAL | Status: DC
  Administered 2012-04-09 – 2012-04-10 (×2): 1000 mg via ORAL
  Filled 2012-04-09 (×4): qty 2

## 2012-04-09 MED ORDER — FENTANYL CITRATE 0.05 MG/ML IJ SOLN
INTRAMUSCULAR | Status: AC
  Filled 2012-04-09: qty 2

## 2012-04-09 MED ORDER — INSULIN ASPART 100 UNIT/ML ~~LOC~~ SOLN
0.0000 [IU] | Freq: Three times a day (TID) | SUBCUTANEOUS | Status: DC
Start: 2012-04-09 — End: 2012-04-09

## 2012-04-09 MED ORDER — GEMFIBROZIL 600 MG PO TABS
600.0000 mg | ORAL_TABLET | Freq: Two times a day (BID) | ORAL | Status: DC
  Administered 2012-04-09 – 2012-04-10 (×2): 600 mg via ORAL
  Filled 2012-04-09 (×4): qty 1

## 2012-04-09 MED ORDER — ACETAMINOPHEN 10 MG/ML IV SOLN: 1000.0000 mg | Freq: Once | INTRAVENOUS | Status: DC

## 2012-04-09 MED ORDER — PROPOFOL 10 MG/ML IV BOLUS
INTRAVENOUS | Status: DC | PRN
  Administered 2012-04-09: 50 mg via INTRAVENOUS
  Administered 2012-04-09: 150 mg via INTRAVENOUS

## 2012-04-09 MED ORDER — RISPERIDONE 1 MG PO TABS
1.0000 mg | ORAL_TABLET | Freq: Two times a day (BID) | ORAL | Status: DC
  Administered 2012-04-09 – 2012-04-10 (×2): 1 mg via ORAL
  Filled 2012-04-09 (×3): qty 1

## 2012-04-09 MED ORDER — DEXTROSE 50 % IV SOLN
INTRAVENOUS | Status: DC | PRN
  Administered 2012-04-09: .5 via INTRAVENOUS

## 2012-04-09 MED ORDER — FLUCONAZOLE 100 MG PO TABS
100.0000 mg | ORAL_TABLET | Freq: Every day | ORAL | Status: DC
  Administered 2012-04-10: 100 mg via ORAL
  Filled 2012-04-09: qty 1

## 2012-04-09 MED ORDER — ESTRADIOL 1 MG PO TABS
2.0000 mg | ORAL_TABLET | Freq: Every morning | ORAL | Status: DC
  Administered 2012-04-10: 2 mg via ORAL
  Filled 2012-04-09: qty 2

## 2012-04-09 MED ORDER — MIDAZOLAM HCL 5 MG/5ML IJ SOLN
INTRAMUSCULAR | Status: DC | PRN
  Administered 2012-04-09: 2 mg via INTRAVENOUS

## 2012-04-09 MED ORDER — FENTANYL CITRATE 0.05 MG/ML IJ SOLN
INTRAMUSCULAR | Status: DC | PRN
  Administered 2012-04-09: 50 ug via INTRAVENOUS
  Administered 2012-04-09: 100 ug via INTRAVENOUS
  Administered 2012-04-09 (×2): 50 ug via INTRAVENOUS

## 2012-04-09 MED ORDER — CEFAZOLIN SODIUM-DEXTROSE 2-3 GM-% IV SOLR
2.0000 g | INTRAVENOUS | Status: AC
  Administered 2012-04-09: 2 g via INTRAVENOUS

## 2012-04-09 MED ORDER — CEFAZOLIN SODIUM 1-5 GM-% IV SOLN
INTRAVENOUS | Status: AC
  Filled 2012-04-09: qty 50

## 2012-04-09 MED ORDER — ENOXAPARIN SODIUM 40 MG/0.4ML ~~LOC~~ SOLN
40.0000 mg | SUBCUTANEOUS | Status: DC
  Administered 2012-04-09: 40 mg via SUBCUTANEOUS
  Filled 2012-04-09 (×2): qty 0.4

## 2012-04-09 MED ORDER — GABAPENTIN 300 MG PO CAPS
300.0000 mg | ORAL_CAPSULE | Freq: Three times a day (TID) | ORAL | Status: DC
  Administered 2012-04-09 – 2012-04-10 (×3): 300 mg via ORAL
  Filled 2012-04-09 (×5): qty 1

## 2012-04-09 MED ORDER — SUCCINYLCHOLINE CHLORIDE 20 MG/ML IJ SOLN
INTRAMUSCULAR | Status: DC | PRN
  Administered 2012-04-09: 100 mg via INTRAVENOUS

## 2012-04-09 MED ORDER — ONDANSETRON HCL 4 MG/2ML IJ SOLN
INTRAMUSCULAR | Status: DC | PRN
  Administered 2012-04-09: 4 mg via INTRAVENOUS

## 2012-04-09 MED ORDER — ATENOLOL 25 MG PO TABS
25.0000 mg | ORAL_TABLET | Freq: Every morning | ORAL | Status: DC
  Administered 2012-04-10: 25 mg via ORAL
  Filled 2012-04-09: qty 1

## 2012-04-09 MED ORDER — METHOCARBAMOL 100 MG/ML IJ SOLN: 500.0000 mg | Freq: Four times a day (QID) | INTRAVENOUS | Status: DC | PRN

## 2012-04-09 MED ORDER — HYDROCHLOROTHIAZIDE 25 MG PO TABS
25.0000 mg | ORAL_TABLET | Freq: Every morning | ORAL | Status: DC
  Administered 2012-04-10: 25 mg via ORAL
  Filled 2012-04-09: qty 1

## 2012-04-09 MED ORDER — METOCLOPRAMIDE HCL 5 MG/ML IJ SOLN: 5.0000 mg | Freq: Three times a day (TID) | INTRAMUSCULAR | Status: DC | PRN

## 2012-04-09 MED ORDER — PANTOPRAZOLE SODIUM 40 MG PO TBEC
40.0000 mg | DELAYED_RELEASE_TABLET | Freq: Every morning | ORAL | Status: DC
  Administered 2012-04-10: 40 mg via ORAL
  Filled 2012-04-09: qty 1

## 2012-04-09 MED ORDER — LACTATED RINGERS IV SOLN
INTRAVENOUS | Status: DC
  Administered 2012-04-09: 1000 mL via INTRAVENOUS

## 2012-04-09 MED ORDER — EFAVIRENZ-EMTRICITAB-TENOFOVIR 600-200-300 MG PO TABS
1.0000 | ORAL_TABLET | Freq: Every day | ORAL | Status: DC
  Administered 2012-04-09: 1 via ORAL
  Filled 2012-04-09 (×2): qty 1

## 2012-04-09 MED ORDER — METHOCARBAMOL 500 MG PO TABS
ORAL_TABLET | ORAL | Status: AC
  Filled 2012-04-09: qty 1

## 2012-04-09 MED ORDER — ONDANSETRON HCL 4 MG PO TABS: 4.0000 mg | ORAL_TABLET | Freq: Four times a day (QID) | ORAL | Status: DC | PRN

## 2012-04-09 MED ORDER — ALBUTEROL SULFATE (5 MG/ML) 0.5% IN NEBU: 2.5000 mg | INHALATION_SOLUTION | Freq: Four times a day (QID) | RESPIRATORY_TRACT | Status: DC | PRN

## 2012-04-09 MED ORDER — ACETAMINOPHEN 10 MG/ML IV SOLN
INTRAVENOUS | Status: DC | PRN
  Administered 2012-04-09: 1000 mg via INTRAVENOUS

## 2012-04-09 MED ORDER — MORPHINE SULFATE 2 MG/ML IJ SOLN
1.0000 mg | INTRAMUSCULAR | Status: DC | PRN
  Administered 2012-04-09 – 2012-04-10 (×5): 2 mg via INTRAVENOUS
  Filled 2012-04-09 (×5): qty 1

## 2012-04-09 MED ORDER — VITAMINS A & D EX OINT
TOPICAL_OINTMENT | CUTANEOUS | Status: AC
  Filled 2012-04-09: qty 5

## 2012-04-09 MED ORDER — BUPIVACAINE-EPINEPHRINE PF 0.25-1:200000 % IJ SOLN
INTRAMUSCULAR | Status: DC | PRN
  Administered 2012-04-09: 20 mL

## 2012-04-09 MED ORDER — METOCLOPRAMIDE HCL 10 MG PO TABS: 5.0000 mg | ORAL_TABLET | Freq: Three times a day (TID) | ORAL | Status: DC | PRN

## 2012-04-09 MED ORDER — MEDROXYPROGESTERONE ACETATE 2.5 MG PO TABS: 2.5000 mg | ORAL_TABLET | Freq: Every morning | ORAL | Status: DC

## 2012-04-09 MED ORDER — ONDANSETRON HCL 4 MG/2ML IJ SOLN: 4.0000 mg | Freq: Four times a day (QID) | INTRAMUSCULAR | Status: DC | PRN

## 2012-04-09 MED ORDER — LACTATED RINGERS IV SOLN: INTRAVENOUS | Status: DC

## 2012-04-09 MED ORDER — INSULIN ASPART 100 UNIT/ML ~~LOC~~ SOLN
0.0000 [IU] | SUBCUTANEOUS | Status: DC
  Administered 2012-04-09: 3 [IU] via SUBCUTANEOUS
  Filled 2012-04-09: qty 1

## 2012-04-09 MED ORDER — ACETAMINOPHEN 10 MG/ML IV SOLN
INTRAVENOUS | Status: AC
  Filled 2012-04-09: qty 100

## 2012-04-09 MED ORDER — CEFAZOLIN SODIUM-DEXTROSE 2-3 GM-% IV SOLR
2.0000 g | Freq: Four times a day (QID) | INTRAVENOUS | Status: AC
  Administered 2012-04-09 – 2012-04-10 (×3): 2 g via INTRAVENOUS
  Filled 2012-04-09 (×3): qty 50

## 2012-04-09 MED ORDER — DM-GUAIFENESIN ER 30-600 MG PO TB12
1.0000 | ORAL_TABLET | Freq: Two times a day (BID) | ORAL | Status: DC | PRN
  Filled 2012-04-09: qty 1

## 2012-04-09 MED ORDER — DEXTROSE 50 % IV SOLN
INTRAVENOUS | Status: AC
  Filled 2012-04-09: qty 50

## 2012-04-09 MED ORDER — PROMETHAZINE HCL 25 MG/ML IJ SOLN: 6.2500 mg | INTRAMUSCULAR | Status: DC | PRN

## 2012-04-09 MED ORDER — TRAMADOL HCL 50 MG PO TABS: 50.0000 mg | ORAL_TABLET | Freq: Four times a day (QID) | ORAL | Status: DC | PRN

## 2012-04-09 MED ORDER — BUPIVACAINE-EPINEPHRINE PF 0.25-1:200000 % IJ SOLN
INTRAMUSCULAR | Status: AC
  Filled 2012-04-09: qty 30

## 2012-04-09 MED ORDER — GLIPIZIDE 10 MG PO TABS
10.0000 mg | ORAL_TABLET | Freq: Two times a day (BID) | ORAL | Status: DC
  Administered 2012-04-09 – 2012-04-10 (×2): 10 mg via ORAL
  Filled 2012-04-09 (×4): qty 1

## 2012-04-09 MED ORDER — FLUTICASONE PROPIONATE 50 MCG/ACT NA SUSP
2.0000 | Freq: Every day | NASAL | Status: DC
  Filled 2012-04-09: qty 16

## 2012-04-09 MED ORDER — INSULIN ASPART 100 UNIT/ML ~~LOC~~ SOLN: 0.0000 [IU] | Freq: Three times a day (TID) | SUBCUTANEOUS | Status: DC

## 2012-04-09 MED ORDER — BUDESONIDE-FORMOTEROL FUMARATE 80-4.5 MCG/ACT IN AERO
2.0000 | INHALATION_SPRAY | Freq: Two times a day (BID) | RESPIRATORY_TRACT | Status: DC
  Administered 2012-04-09 – 2012-04-10 (×2): 2 via RESPIRATORY_TRACT
  Filled 2012-04-09: qty 6.9

## 2012-04-09 MED ORDER — HYDROMORPHONE HCL PF 1 MG/ML IJ SOLN
0.2500 mg | INTRAMUSCULAR | Status: DC | PRN
  Administered 2012-04-09 (×4): 0.5 mg via INTRAVENOUS

## 2012-04-09 MED ORDER — OXYCODONE HCL 5 MG PO TABS
5.0000 mg | ORAL_TABLET | ORAL | Status: DC | PRN
  Administered 2012-04-09 – 2012-04-10 (×4): 10 mg via ORAL
  Filled 2012-04-09 (×5): qty 2

## 2012-04-09 MED ORDER — CEFAZOLIN SODIUM-DEXTROSE 2-3 GM-% IV SOLR
INTRAVENOUS | Status: AC
  Filled 2012-04-09: qty 50

## 2012-04-09 MED ORDER — DIAZEPAM 5 MG PO TABS: 2.5000 mg | ORAL_TABLET | Freq: Every evening | ORAL | Status: DC | PRN

## 2012-04-09 SURGICAL SUPPLY — 37 items
BAG ZIPLOCK 12X15 (MISCELLANEOUS) ×2 IMPLANT
BANDAGE GAUZE ELAST BULKY 4 IN (GAUZE/BANDAGES/DRESSINGS) ×2 IMPLANT
CLOSURE STERI-STRIP 1/4X4 (GAUZE/BANDAGES/DRESSINGS) ×2 IMPLANT
CLOTH BEACON ORANGE TIMEOUT ST (SAFETY) ×2 IMPLANT
DRAPE STERI IOBAN 125X83 (DRAPES) ×2 IMPLANT
DRSG EMULSION OIL 3X16 NADH (GAUZE/BANDAGES/DRESSINGS) IMPLANT
DRSG MEPILEX BORDER 4X8 (GAUZE/BANDAGES/DRESSINGS) ×2 IMPLANT
DRSG PAD ABDOMINAL 8X10 ST (GAUZE/BANDAGES/DRESSINGS) IMPLANT
DURAPREP 26ML APPLICATOR (WOUND CARE) ×2 IMPLANT
ELECT REM PT RETURN 9FT ADLT (ELECTROSURGICAL) ×2
ELECTRODE REM PT RTRN 9FT ADLT (ELECTROSURGICAL) ×1 IMPLANT
EVACUATOR 1/8 PVC DRAIN (DRAIN) IMPLANT
GLOVE BIO SURGEON STRL SZ7.5 (GLOVE) ×2 IMPLANT
GLOVE BIO SURGEON STRL SZ8 (GLOVE) ×4 IMPLANT
GOWN STRL NON-REIN LRG LVL3 (GOWN DISPOSABLE) ×2 IMPLANT
GOWN STRL REIN XL XLG (GOWN DISPOSABLE) ×2 IMPLANT
MANIFOLD NEPTUNE II (INSTRUMENTS) IMPLANT
NS IRRIG 1000ML POUR BTL (IV SOLUTION) ×2 IMPLANT
PACK GENERAL/GYN (CUSTOM PROCEDURE TRAY) ×2 IMPLANT
PAD CAST 4YDX4 CTTN HI CHSV (CAST SUPPLIES) IMPLANT
PADDING CAST COTTON 4X4 STRL (CAST SUPPLIES)
PIN THREADED GUIDE ACE (PIN) ×2 IMPLANT
POSITIONER SURGICAL ARM (MISCELLANEOUS) ×2 IMPLANT
SCREW CANN 6.5 80MM (Screw) ×1 IMPLANT
SCREW CANN 6.5 85MM (Screw) ×1 IMPLANT
SCREW CANN LG 6.5 FLT 80X22 (Screw) ×1 IMPLANT
SCREW CANN LG 6.5 FLT 85X22 (Screw) ×1 IMPLANT
SPONGE GAUZE 4X4 12PLY (GAUZE/BANDAGES/DRESSINGS) IMPLANT
SPONGE LAP 18X18 X RAY DECT (DISPOSABLE) IMPLANT
STRIP CLOSURE SKIN 1/2X4 (GAUZE/BANDAGES/DRESSINGS) ×2 IMPLANT
SUT MNCRL AB 3-0 PS2 18 (SUTURE) ×2 IMPLANT
SUT VIC AB 1 CT1 36 (SUTURE) ×4 IMPLANT
SUT VIC AB 2-0 CT1 27 (SUTURE) ×4
SUT VIC AB 2-0 CT1 TAPERPNT 27 (SUTURE) ×2 IMPLANT
TOWEL OR 17X26 10 PK STRL BLUE (TOWEL DISPOSABLE) ×4 IMPLANT
TRAY FOLEY CATH 14FRSI W/METER (CATHETERS) IMPLANT
WATER STERILE IRR 1500ML POUR (IV SOLUTION) ×2 IMPLANT

## 2012-04-09 NOTE — Transfer of Care (Signed)
Immediate Anesthesia Transfer of Care Note  Patient: [name]  Procedure(s) Performed: Procedure(s): CANNULATED HIP PINNING AND HARDWARE REVISION (Left)  Patient Location: PACU  Anesthesia Type:General  Level of Consciousness: awake, alert , oriented and patient cooperative  Airway & Oxygen Therapy: Patient Spontanous Breathing and Patient connected to face mask oxygen  Post-op Assessment: Report given to PACU RN and Post -op Vital signs reviewed and stable  Post vital signs: Reviewed and stable  Complications: No apparent anesthesia complications

## 2012-04-09 NOTE — Brief Op Note (Signed)
04/09/2012  2:47 PM  PATIENT:  [name]  47 y.o. female  PRE-OPERATIVE DIAGNOSIS:  PAINFUL HARDWARE OF LEFT HIP  POST-OPERATIVE DIAGNOSIS:  painful hardware of left hip  PROCEDURE:  Procedure(s): CANNULATED HIP PINNING AND HARDWARE REVISION (Left)  SURGEON:  Surgeon(s) and Role:    * Gearlean Alf, MD - Primary  PHYSICIAN ASSISTANT:   ASSISTANTS: none   ANESTHESIA:   general  EBL:     BLOOD ADMINISTERED:none  DRAINS: none   LOCAL MEDICATIONS USED:  MARCAINE   COUNTS:  YES  TOURNIQUET:  * No tourniquets in log *  DICTATION: .Other Dictation: Dictation Number [id number]  PLAN OF CARE: Admit for overnight observation  PATIENT DISPOSITION:  PACU - hemodynamically stable.

## 2012-04-09 NOTE — Anesthesia Postprocedure Evaluation (Signed)
Anesthesia Post Note  Patient: [name]  Procedure(s) Performed: Procedure(s) (LRB): CANNULATED HIP PINNING AND HARDWARE REVISION (Left)  Anesthesia type: General  Patient location: PACU  Post pain: Pain level controlled  Post assessment: Post-op Vital signs reviewed  Last Vitals:  Filed Vitals:   04/09/12 1222  BP: 146/78  Pulse:   Temp:   Resp:     Post vital signs: Reviewed  Level of consciousness: sedated  Complications: No apparent anesthesia complications

## 2012-04-09 NOTE — H&P (Signed)
CC- [name] is a 47 y.o. female who presents with left hip pain  Hip Pain: Patient complains of left hip pain. Onset of the symptoms was several months ago. Inciting event: femoral neck fracture. Current symptoms include lateral hip pain and difficulty lying on left side. Associated symptoms: none, popping sensation. Aggravating symptoms: going up and down stairs, pivoting, rising after sitting, standing, walking and lying on left side. Patient's pain is worsening. Patient has had prior hip problems. Evaluation to date: plain films, which were showing hardware prominent laterally with fracture healed and CT scan showing no femoral head collapse with near full heling of the fracture.  Treatment to date: prescription analgesics, which have been somewhat effective.  Past Medical History  Diagnosis Date  . Anxiety   . Depression   . Hypertension   . HIV infection 2000  . Asthma     HOSPITALIZED WITH EXCERBATION OF ASTHMA - AND BRONCHITIS AND THE FLU DEC 2013  . Shortness of breath     ALLERGIES ARE "ACTING UP"  . Diabetes mellitus     ON INSULIN AND ORAL MEDICATIONS  . GERD (gastroesophageal reflux disease)   . Neuromuscular disorder     NEUROPATHY HANDS AND FEET  . Arthritis     LEFT HIP  . Pain     SEVERE PAIN LEFT HIP - HX OF LEFT HIP PINNING JULY 2013    Past Surgical History  Procedure Laterality Date  . Hip pinning,cannulated  07/16/2011    Procedure: CANNULATED HIP PINNING;  Surgeon: Gearlean Alf, MD;  Location: [facility];  Service: Orthopedics;  Laterality: Left;    Prior to Admission medications   Medication Sig Start Date End Date Taking? Authorizing Provider  albuterol (PROVENTIL) (2.5 MG/3ML) 0.083% nebulizer solution Take 2.5 mg by nebulization every 6 (six) hours as needed for wheezing. For wheezing or shortness of breath 01/01/12   Hosie Poisson, MD  atenolol (TENORMIN) 25 MG tablet Take 25 mg by mouth every morning.     Historical Provider, MD   budesonide-formoterol (SYMBICORT) 80-4.5 MCG/ACT inhaler Inhale 2 puffs into the lungs 2 (two) times daily. 01/01/12   Hosie Poisson, MD  carbamazepine (TEGRETOL XR) 200 MG 12 hr tablet Take 600 mg by mouth 2 (two) times daily. 05/22/10   Campbell Riches, MD  dextromethorphan-guaiFENesin [facility] DM) 30-600 MG per 12 hr tablet Take 1 tablet by mouth 2 (two) times daily as needed (congestion). 01/01/12   Hosie Poisson, MD  diazepam (VALIUM) 5 MG tablet Take 2.5-5 mg by mouth at bedtime as needed for anxiety or sleep (7.5 MG DAILY  --TAKES 5MG  EVERY NIGHT AND 2.5MG  DURING DAY IF NEEDED).  01/01/12   Hosie Poisson, MD  efavirenz-emtricitabine-tenofovir (ATRIPLA) [id number] MG per tablet Take 1 tablet by mouth at bedtime. 10/02/11   Campbell Riches, MD  estradiol (ESTRACE) 2 MG tablet Take 2 mg by mouth every morning. 02/13/12   Martha Clan, MD  fish oil-omega-3 fatty acids 1000 MG capsule Take 1 g by mouth 2 (two) times daily.    Historical Provider, MD  fluconazole (DIFLUCAN) 100 MG tablet Take 100 mg by mouth daily.    Historical Provider, MD  gabapentin (NEURONTIN) 300 MG capsule Take 300 mg by mouth 3 (three) times daily.    Historical Provider, MD  gemfibrozil (LOPID) 600 MG tablet Take 600 mg by mouth 2 (two) times daily before a meal. 1/2 hour before meals 09/14/10   Thayer Headings, MD  glipiZIDE (GLUCOTROL) 10  MG tablet Take 10 mg by mouth 2 (two) times daily before a meal.    Historical Provider, MD  hydrochlorothiazide (HYDRODIURIL) 25 MG tablet Take 25 mg by mouth every morning.     Historical Provider, MD  HYDROcodone-acetaminophen (LORCET) 10-650 MG per tablet Take 1 tablet by mouth every 4 (four) hours as needed. Take 1 tablet every 4-6 hours as needed for hip pain 01/01/12   Hosie Poisson, MD  insulin aspart (NOVOLOG) 100 UNIT/ML injection Inject 15 Units into the skin 3 (three) times daily before meals. Pt takes 15 units at each meals but sometimes may need more.    Historical Provider, MD   insulin aspart (NOVOLOG) 100 UNIT/ML injection Inject 0-20 Units into the skin 3 (three) times daily with meals. 01/01/12   Hosie Poisson, MD  insulin glargine (LANTUS) 100 UNIT/ML injection Inject 50 Units into the skin 2 (two) times daily. 06/09/10   Campbell Riches, MD  medroxyPROGESTERone (PROVERA) 2.5 MG tablet Take 2.5 mg by mouth every morning. Days 1-5 of the cycle 02/13/12   Martha Clan, MD  metFORMIN (GLUCOPHAGE) 1000 MG tablet Take 1,000 mg by mouth 2 (two) times daily with a meal.    Historical Provider, MD  methocarbamol (ROBAXIN) 500 MG tablet Take 500 mg by mouth every 6 (six) hours as needed. Every 6-8 hours as needed for muscle spasms    Historical Provider, MD  mometasone (NASONEX) 50 MCG/ACT nasal spray Place 2 sprays into the nose daily.    Historical Provider, MD  Multiple Vitamins-Minerals (MULTIVITAMIN WITH MINERALS) tablet Take 1 tablet by mouth daily.    Historical Provider, MD  pantoprazole (PROTONIX) 40 MG tablet Take 40 mg by mouth every morning.  07/13/10   Lyndee Hensen, NP  risperiDONE (RISPERDAL) 1 MG tablet Take 1 mg by mouth 2 (two) times daily.    Historical Provider, MD  traMADol (ULTRAM) 50 MG tablet Take 50 mg by mouth every 6 (six) hours as needed for pain.    Historical Provider, MD  vitamin E (VITAMIN E) 1000 UNIT capsule Take 5,000 Units by mouth 2 (two) times daily.    Historical Provider, MD    Physical Examination: General appearance - alert, well appearing, and in no distress Mental status - alert, oriented to person, place, and time Chest - clear to auscultation, no wheezes, rales or rhonchi, symmetric air entry Heart - normal rate, regular rhythm, normal S1, S2, no murmurs, rubs, clicks or gallops Abdomen - soft, nontender, nondistended, no masses or organomegaly Neurological - alert, oriented, normal speech, no focal findings or movement disorder noted  A left hip exam was performed. SWELLING: none TENDERNESS: maximal at greater  trochanter ROM: normal STRENGTH: normal GAIT: antalgic  ASSESSMENT: Painful hardware left hip- Patient has debilitating pain laterally from the hardware. The fracture is almost fully healed so the plan will be to remove the screws and replace them with shorter length screws to maintain stabilization of the femoral neck while the fracture fully heals. Procedure, risks, potential complications discussed with patient who elects to proceed. Goals are decreased pain and improved function, both of which should be achieved.  Plan  [name]. Erin Didonato, MD    04/09/2012, 8:15 AM

## 2012-04-09 NOTE — Interval H&P Note (Signed)
History and Physical Interval Note:  04/09/2012 1:47 PM  [name]  has presented today for surgery, with the diagnosis of PAINFUL HARDWARE OF LEFT HIP  The various methods of treatment have been discussed with the patient and family. After consideration of risks, benefits and other options for treatment, the patient has consented to  Procedure(s): CANNULATED HIP PINNING AND HARDWARE REVISION (Left) as a surgical intervention .  The patient's history has been reviewed, patient examined, no change in status, stable for surgery.  I have reviewed the patient's chart and labs.  Questions were answered to the patient's satisfaction.     Gearlean Alf

## 2012-04-09 NOTE — Anesthesia Preprocedure Evaluation (Signed)
Anesthesia Evaluation  Patient identified by MRN, date of birth, ID band Patient awake    Reviewed: Allergy & Precautions, H&P , NPO status , Patient's Chart, lab work & pertinent test results  Airway Mallampati: II TM Distance: >3 FB Neck ROM: Full    Dental no notable dental hx.    Pulmonary neg pulmonary ROS, shortness of breath, asthma , Current Smoker,  breath sounds clear to auscultation  Pulmonary exam normal       Cardiovascular hypertension, Pt. on medications and Pt. on home beta blockers negative cardio ROS  Rhythm:Regular Rate:Normal     Neuro/Psych PSYCHIATRIC DISORDERS Anxiety Depression  Neuromuscular disease negative neurological ROS  negative psych ROS   GI/Hepatic negative GI ROS, Neg liver ROS, GERD-  ,  Endo/Other  negative endocrine ROSdiabetes, Type 2, Oral Hypoglycemic Agents and Insulin DependentMorbid obesity  Renal/GU negative Renal ROS  negative genitourinary   Musculoskeletal negative musculoskeletal ROS (+)   Abdominal (+) + obese,   Peds  Hematology negative hematology ROS (+) HIV,   Anesthesia Other Findings   Reproductive/Obstetrics negative OB ROS                           Anesthesia Physical Anesthesia Plan  ASA: III  Anesthesia Plan: General   Post-op Pain Management:    Induction: Intravenous  Airway Management Planned: Oral ETT  Additional Equipment:   Intra-op Plan:   Post-operative Plan: Extubation in OR  Informed Consent:   Dental advisory given  Plan Discussed with: CRNA  Anesthesia Plan Comments:         Anesthesia Quick Evaluation

## 2012-04-10 ENCOUNTER — Encounter (HOSPITAL_COMMUNITY): Payer: Self-pay | Admitting: Orthopedic Surgery

## 2012-04-10 LAB — GLUCOSE, CAPILLARY
Glucose-Capillary: 125 mg/dL — ABNORMAL HIGH (ref 70–99)
Glucose-Capillary: 189 mg/dL — ABNORMAL HIGH (ref 70–99)
Glucose-Capillary: 265 mg/dL — ABNORMAL HIGH (ref 70–99)

## 2012-04-10 MED ORDER — METHOCARBAMOL 500 MG PO TABS: 500.0000 mg | ORAL_TABLET | Freq: Four times a day (QID) | ORAL | Status: DC | PRN

## 2012-04-10 MED ORDER — OXYCODONE HCL 5 MG PO TABS: 5.0000 mg | ORAL_TABLET | ORAL | Status: DC | PRN

## 2012-04-10 NOTE — Discharge Summary (Signed)
Physician Discharge Summary   Patient ID: [name] MRN: [id number] DOB/AGE: [birth year] 47 y.o.  Admit date: 04/09/2012 Discharge date: 04/10/2012  Primary Diagnosis:  Painful hardware, left hip.  Admission Diagnoses:  Past Medical History  Diagnosis Date  . Anxiety   . Depression   . Hypertension   . HIV infection 2000  . Asthma     HOSPITALIZED WITH EXCERBATION OF ASTHMA - AND BRONCHITIS AND THE FLU DEC 2013  . Shortness of breath     ALLERGIES ARE "ACTING UP"  . Diabetes mellitus     ON INSULIN AND ORAL MEDICATIONS  . GERD (gastroesophageal reflux disease)   . Neuromuscular disorder     NEUROPATHY HANDS AND FEET  . Arthritis     LEFT HIP  . Pain     SEVERE PAIN LEFT HIP - HX OF LEFT HIP PINNING JULY 2013   Discharge Diagnoses:   Active Problems:   * No active hospital problems. *  Estimated body mass index is 34.46 kg/(m^2) as calculated from the following:   Height as of this encounter: 5\' 11"  (1.803 m).   Weight as of this encounter: 112.038 kg (247 lb).  Procedure(s) (LRB): CANNULATED HIP PINNING AND HARDWARE REVISION (Left)   Consults: None  HPI: [name] is a 47 year old female, had a left femoral  neck fracture last summer. She was in-situ pinning. She has had some  slight collapse of the neck, but the femoral head has remained normal  and the fracture just about fully healed on the CT scan. The screws are  starting to migrate laterally, just getting a lot of pain from the  screws. At this time, I decided to go ahead and remove the screws and  replace at least 2 of them with shorter length screws. They will not  irritate her lateral tissues. She presents now for that procedure.   Laboratory Data: Admission on 04/09/2012  Component Date Value Range Status  . Glucose-Capillary 04/09/2012 229* 70 - 99 mg/dL Final  . Comment 1 04/09/2012 Documented in Chart   Final  . Glucose-Capillary 04/09/2012 134* 70 - 99 mg/dL Final  . Glucose-Capillary  04/09/2012 73  70 - 99 mg/dL Final  . Comment 1 04/09/2012 Documented in Chart   Final  . Glucose-Capillary 04/09/2012 99  70 - 99 mg/dL Final  . Comment 1 04/09/2012 Notify RN   Final  . Comment 2 04/09/2012 Documented in Chart   Final  . Glucose-Capillary 04/09/2012 125* 70 - 99 mg/dL Final  . Comment 1 04/09/2012 Documented in Chart   Final  . Glucose-Capillary 04/10/2012 265* 70 - 99 mg/dL Final  . Comment 1 04/10/2012 Notify RN   Final  . Comment 2 04/10/2012 Documented in Chart   Final  Hospital Outpatient Visit on 04/07/2012  Component Date Value Range Status  . MRSA, PCR 04/07/2012 NEGATIVE  NEGATIVE Final  . Staphylococcus aureus 04/07/2012 POSITIVE* NEGATIVE Final   Comment:                                 The Xpert SA Assay (FDA                          approved for NASAL specimens                          in patients over 21 years  of age),                          is one component of                          a comprehensive surveillance                          program.  Test performance has                          been validated by [employer] for patients greater                          than or equal to 6 year old.                          It is not intended                          to diagnose infection nor to                          guide or monitor treatment.  . WBC 04/07/2012 9.1  4.0 - 10.5 K/uL Final  . RBC 04/07/2012 4.31  3.87 - 5.11 MIL/uL Final  . Hemoglobin 04/07/2012 12.1  12.0 - 15.0 g/dL Final  . HCT 04/07/2012 37.0  36.0 - 46.0 % Final  . MCV 04/07/2012 85.8  78.0 - 100.0 fL Final  . MCH 04/07/2012 28.1  26.0 - 34.0 pg Final  . MCHC 04/07/2012 32.7  30.0 - 36.0 g/dL Final  . RDW 04/07/2012 17.3* 11.5 - 15.5 % Final  . Platelets 04/07/2012 395  150 - 400 K/uL Final  . Sodium 04/07/2012 127* 135 - 145 mEq/L Final  . Potassium 04/07/2012 4.2  3.5 - 5.1 mEq/L Final  . Chloride 04/07/2012 91* 96 - 112 mEq/L Final  . CO2  04/07/2012 23  19 - 32 mEq/L Final  . Glucose, Bld 04/07/2012 322* 70 - 99 mg/dL Final  . BUN 04/07/2012 28* 6 - 23 mg/dL Final  . Creatinine, Ser 04/07/2012 1.09  0.50 - 1.10 mg/dL Final  . Calcium 04/07/2012 9.2  8.4 - 10.5 mg/dL Final  . Total Protein 04/07/2012 7.6  6.0 - 8.3 g/dL Final  . Albumin 04/07/2012 3.2* 3.5 - 5.2 g/dL Final  . AST 04/07/2012 12  0 - 37 U/L Final  . ALT 04/07/2012 15  0 - 35 U/L Final  . Alkaline Phosphatase 04/07/2012 80  39 - 117 U/L Final  . Total Bilirubin 04/07/2012 0.1* 0.3 - 1.2 mg/dL Final  . GFR calc non Af Amer 04/07/2012 59* >90 mL/min Final  . GFR calc Af Amer 04/07/2012 68* >90 mL/min Final   Comment:                                 The eGFR has been calculated  using the CKD EPI equation.                          This calculation has not been                          validated in all clinical                          situations.                          eGFR's persistently                          <90 mL/min signify                          possible Chronic Kidney Disease.  [name] Kitchen aPTT 04/07/2012 41* 24 - 37 seconds Final   Comment:                                 IF BASELINE aPTT IS ELEVATED,                          SUGGEST PATIENT RISK ASSESSMENT                          BE USED TO DETERMINE APPROPRIATE                          ANTICOAGULANT THERAPY.  . ABO/RH(D) 04/07/2012 O NEG   Final  . Antibody Screen 04/07/2012 NEG   Final  . Sample Expiration 04/07/2012 04/12/2012   Final  . Prothrombin Time 04/07/2012 13.5  11.6 - 15.2 seconds Final  . INR 04/07/2012 1.04  0.00 - 1.49 Final     X-Rays:Dg Hip Operative Left  04/09/2012  *RADIOLOGY REPORT*  Clinical Data: Painful hardware in the left hip from previous open reduction and internal fixation.  OPERATIVE LEFT HIP  Comparison: C-arm images dated 07/16/2011  Findings: The three cannulated pins have been removed and replaced by two new cannulated pins.  The femoral  head is slightly collapsed into the femoral neck.  IMPRESSION: Revision of the internal fixation hardware.   Original Report Authenticated By: Lorriane Shire, M.D.     EKG: Orders placed during the hospital encounter of 12/28/11  . EKG     Hospital Course: Patient was admitted to [facility] and taken to the OR and underwent the above state procedure without complications.  Patient tolerated the procedure well and was later transferred to the recovery room and then to the orthopaedic floor for postoperative care.  They were given PO and IV analgesics for pain control following their surgery.  They were given 24 hours of postoperative antibiotics of  Anti-infectives      Dose/Rate Route Frequency Ordered Stop                       04/09/12 2000  ceFAZolin (ANCEF) IVPB 2 g/50 mL premix     2 g 100 mL/hr over 30 Minutes Intravenous Every 6 hours 04/09/12 1645 04/10/12 1359   04/09/12 1145  ceFAZolin (ANCEF) IVPB 2 g/50 mL premix     2 g 100 mL/hr over 30 Minutes Intravenous On call to O.R. 04/09/12 1144 04/09/12 1355     and started on DVT prophylaxis in the form of Aspirin.   PT was ordered for mobility.  The patient was allowed to be WBAT with therapy. Discharge planning was consulted to help with postop disposition and equipment needs.  Patient had a decent night on the evening of surgery.  They started to get up OOB with therapy on day one and walked about 100 feet.  Patient was seen in rounds and was ready to go home later that same day following therapy.    Discharge Medications: Prior to Admission medications   Medication Sig Start Date End Date Taking? Authorizing Provider  albuterol (PROVENTIL) (2.5 MG/3ML) 0.083% nebulizer solution Take 2.5 mg by nebulization every 6 (six) hours as needed for wheezing. For wheezing or shortness of breath 01/01/12  Yes Hosie Poisson, MD  atenolol (TENORMIN) 25 MG tablet Take 25 mg by mouth every morning.    Yes Historical Provider, MD    budesonide-formoterol (SYMBICORT) 80-4.5 MCG/ACT inhaler Inhale 2 puffs into the lungs 2 (two) times daily. 01/01/12  Yes Hosie Poisson, MD  carbamazepine (TEGRETOL XR) 200 MG 12 hr tablet Take 600 mg by mouth 2 (two) times daily. 05/22/10  Yes Campbell Riches, MD  dextromethorphan-guaiFENesin [facility] DM) 30-600 MG per 12 hr tablet Take 1 tablet by mouth 2 (two) times daily as needed (congestion). 01/01/12  Yes Hosie Poisson, MD  diazepam (VALIUM) 5 MG tablet Take 2.5-5 mg by mouth at bedtime as needed for anxiety or sleep (7.5 MG DAILY  --TAKES 5MG  EVERY NIGHT AND 2.5MG  DURING DAY IF NEEDED).  01/01/12  Yes Hosie Poisson, MD  efavirenz-emtricitabine-tenofovir (ATRIPLA) [id number] MG per tablet Take 1 tablet by mouth at bedtime. 10/02/11  Yes Campbell Riches, MD  fluconazole (DIFLUCAN) 100 MG tablet Take 100 mg by mouth daily.   Yes Historical Provider, MD  gabapentin (NEURONTIN) 300 MG capsule Take 300 mg by mouth 3 (three) times daily.   Yes Historical Provider, MD  gemfibrozil (LOPID) 600 MG tablet Take 600 mg by mouth 2 (two) times daily before a meal. 1/2 hour before meals 09/14/10  Yes Thayer Headings, MD  glipiZIDE (GLUCOTROL) 10 MG tablet Take 10 mg by mouth 2 (two) times daily before a meal.   Yes Historical Provider, MD  hydrochlorothiazide (HYDRODIURIL) 25 MG tablet Take 25 mg by mouth every morning.    Yes Historical Provider, MD  insulin aspart (NOVOLOG) 100 UNIT/ML injection Inject 15 Units into the skin 3 (three) times daily before meals. Pt takes 15 units at each meals but sometimes may need more.   Yes Historical Provider, MD  insulin aspart (NOVOLOG) 100 UNIT/ML injection Inject 0-20 Units into the skin 3 (three) times daily with meals. 01/01/12  Yes Hosie Poisson, MD  insulin glargine (LANTUS) 100 UNIT/ML injection Inject 50 Units into the skin 2 (two) times daily. 06/09/10  Yes Campbell Riches, MD  metFORMIN (GLUCOPHAGE) 1000 MG tablet Take 1,000 mg by mouth 2 (two) times daily with  a meal.   Yes Historical Provider, MD  mometasone (NASONEX) 50 MCG/ACT nasal spray Place 2 sprays into the nose daily.   Yes Historical Provider, MD  pantoprazole (PROTONIX) 40 MG tablet Take 40 mg by mouth every morning.  07/13/10  Yes Lyndee Hensen, NP  risperiDONE (RISPERDAL) 1 MG tablet Take 1 mg  by mouth 2 (two) times daily.   Yes Historical Provider, MD  traMADol (ULTRAM) 50 MG tablet Take 50 mg by mouth every 6 (six) hours as needed for pain.   Yes Historical Provider, MD  vitamin E (VITAMIN E) 1000 UNIT capsule Take 5,000 Units by mouth 2 (two) times daily.   Yes Historical Provider, MD  fish oil-omega-3 fatty acids 1000 MG capsule Take 1 g by mouth 2 (two) times daily.    Historical Provider, MD  methocarbamol (ROBAXIN) 500 MG tablet Take 1 tablet (500 mg total) by mouth every 6 (six) hours as needed. Every 6-8 hours as needed for muscle spasms 04/10/12   Roshad Hack Dara Lords, PA-C  Multiple Vitamins-Minerals (MULTIVITAMIN WITH MINERALS) tablet Take 1 tablet by mouth daily.    Historical Provider, MD  oxyCODONE (OXY IR/ROXICODONE) 5 MG immediate release tablet Take 1-2 tablets (5-10 mg total) by mouth every 3 (three) hours as needed. 04/10/12   Sequoia Witz Dara Lords, PA-C    Diet: Cardiac diet Activity:WBAT Follow-up:in 2 weeks Disposition - Home Discharged Condition: good   Discharge Orders   Future Orders Complete By Expires     Call MD / Call 911  As directed     Comments:      If you experience chest pain or shortness of breath, CALL 911 and be transported to the hospital emergency room.  If you develope a fever above 101 F, pus (white drainage) or increased drainage or redness at the wound, or calf pain, call your surgeon's office.    Change dressing  As directed     Comments:      You may change your dressing dressing daily with sterile 4 x 4 inch gauze dressing and paper tape.  Do not submerge the incision under water.    Constipation Prevention  As directed      Comments:      Drink plenty of fluids.  Prune juice may be helpful.  You may use a stool softener, such as Colace (over the counter) 100 mg twice a day.  Use MiraLax (over the counter) for constipation as needed.    Diet - low sodium heart healthy  As directed     Discharge instructions  As directed     Comments:      Pick up stool softner and laxative for home. Do not submerge incision under water. May start showering on Saturday 04/12/2012 Continue to use ice for pain and swelling from surgery.  Take an 81 mg Aspirin daily for four weeks.    Do not sit on low chairs, stoools or toilet seats, as it may be difficult to get up from low surfaces  As directed     Driving restrictions  As directed     Comments:      No driving until released by the physician.    Increase activity slowly as tolerated  As directed     Lifting restrictions  As directed     Comments:      No lifting until released by the physician.    Patient may shower  As directed     Comments:      You may shower without a dressing once there is no drainage.  Do not wash over the wound.  If drainage remains, do not shower until drainage stops.    TED hose  As directed     Comments:      Use stockings (TED hose) for 3 weeks on both leg(s).  You may  remove them at night for sleeping.    Weight bearing as tolerated  As directed         Medication List    STOP taking these medications       estradiol 2 MG tablet  Commonly known as:  ESTRACE     HYDROcodone-acetaminophen 10-650 MG per tablet  Commonly known as:  LORCET     medroxyPROGESTERone 2.5 MG tablet  Commonly known as:  PROVERA      TAKE these medications       albuterol (2.5 MG/3ML) 0.083% nebulizer solution  Commonly known as:  PROVENTIL  Take 2.5 mg by nebulization every 6 (six) hours as needed for wheezing. For wheezing or shortness of breath     atenolol 25 MG tablet  Commonly known as:  TENORMIN  Take 25 mg by mouth every morning.      budesonide-formoterol 80-4.5 MCG/ACT inhaler  Commonly known as:  SYMBICORT  Inhale 2 puffs into the lungs 2 (two) times daily.     carbamazepine 200 MG 12 hr tablet  Commonly known as:  TEGRETOL XR  Take 600 mg by mouth 2 (two) times daily.     dextromethorphan-guaiFENesin 30-600 MG per 12 hr tablet  Commonly known as:  MUCINEX DM  Take 1 tablet by mouth 2 (two) times daily as needed (congestion).     diazepam 5 MG tablet  Commonly known as:  VALIUM  Take 2.5-5 mg by mouth at bedtime as needed for anxiety or sleep (7.5 MG DAILY  --TAKES 5MG  EVERY NIGHT AND 2.5MG  DURING DAY IF NEEDED).     efavirenz-emtricitabine-tenofovir 600-200-300 MG per tablet  Commonly known as:  ATRIPLA  Take 1 tablet by mouth at bedtime.     fish oil-omega-3 fatty acids 1000 MG capsule  Take 1 g by mouth 2 (two) times daily.     fluconazole 100 MG tablet  Commonly known as:  DIFLUCAN  Take 100 mg by mouth daily.     gabapentin 300 MG capsule  Commonly known as:  NEURONTIN  Take 300 mg by mouth 3 (three) times daily.     gemfibrozil 600 MG tablet  Commonly known as:  LOPID  Take 600 mg by mouth 2 (two) times daily before a meal. 1/2 hour before meals     glipiZIDE 10 MG tablet  Commonly known as:  GLUCOTROL  Take 10 mg by mouth 2 (two) times daily before a meal.     hydrochlorothiazide 25 MG tablet  Commonly known as:  HYDRODIURIL  Take 25 mg by mouth every morning.     insulin aspart 100 UNIT/ML injection  Commonly known as:  novoLOG  Inject 0-20 Units into the skin 3 (three) times daily with meals.     insulin aspart 100 UNIT/ML injection  Commonly known as:  novoLOG  Inject 15 Units into the skin 3 (three) times daily before meals. Pt takes 15 units at each meals but sometimes may need more.     insulin glargine 100 UNIT/ML injection  Commonly known as:  LANTUS  Inject 50 Units into the skin 2 (two) times daily.     metFORMIN 1000 MG tablet  Commonly known as:  GLUCOPHAGE  Take 1,000  mg by mouth 2 (two) times daily with a meal.     methocarbamol 500 MG tablet  Commonly known as:  ROBAXIN  Take 1 tablet (500 mg total) by mouth every 6 (six) hours as needed. Every 6-8 hours as needed for muscle spasms  mometasone 50 MCG/ACT nasal spray  Commonly known as:  NASONEX  Place 2 sprays into the nose daily.     multivitamin with minerals tablet  Take 1 tablet by mouth daily.     oxyCODONE 5 MG immediate release tablet  Commonly known as:  Oxy IR/ROXICODONE  Take 1-2 tablets (5-10 mg total) by mouth every 3 (three) hours as needed.     pantoprazole 40 MG tablet  Commonly known as:  PROTONIX  Take 40 mg by mouth every morning.     risperiDONE 1 MG tablet  Commonly known as:  RISPERDAL  Take 1 mg by mouth 2 (two) times daily.     traMADol 50 MG tablet  Commonly known as:  ULTRAM  Take 50 mg by mouth every 6 (six) hours as needed for pain.     vitamin E 1000 UNIT capsule  Generic drug:  vitamin E  Take 5,000 Units by mouth 2 (two) times daily.           Follow-up Information   Follow up with Gearlean Alf, MD. Schedule an appointment as soon as possible for a visit in 2 weeks.   Contact information:   [address] [place] [address] [id number]       Signed: Mickel Crow 04/10/2012, 8:03 AM

## 2012-04-10 NOTE — Progress Notes (Signed)
   Subjective: 1 Day Post-Op Procedure(s) (LRB): CANNULATED HIP PINNING AND HARDWARE REVISION (Left) Patient reports pain as moderate and severe after the numbing med wore off.  Patient seen in rounds with Dr. Wynelle Link. Patient is well, but has had some minor complaints of pain in the hip, requiring pain medications Patient is ready to go home later today after therapy.  Objective: Vital signs in last 24 hours: Temp:  [97.4 F (36.3 C)-98.9 F (37.2 C)] 98 F (36.7 C) (04/10 0155) Pulse Rate:  [71-86] 76 (04/10 0155) Resp:  [11-20] 16 (04/10 0155) BP: (110-194)/(58-162) 122/78 mmHg (04/10 0155) SpO2:  [93 %-100 %] 98 % (04/10 0155) Weight:  [112.038 kg (247 lb)] 112.038 kg (247 lb) (04/09 1757)  Intake/Output from previous day:  Intake/Output Summary (Last 24 hours) at 04/10/12 0644 Last data filed at 04/10/12 0500  Gross per 24 hour  Intake   2580 ml  Output    600 ml  Net   1980 ml    Intake/Output this shift: Total I/O In: 1250 [P.O.:720; I.V.:530] Out: 600 [Urine:600]  Labs:  Recent Labs  04/07/12 1010  HGB 12.1    Recent Labs  04/07/12 1010  WBC 9.1  RBC 4.31  HCT 37.0  PLT 395    Recent Labs  04/07/12 1010  NA 127*  K 4.2  CL 91*  CO2 23  BUN 28*  CREATININE 1.09  GLUCOSE 322*  CALCIUM 9.2    Recent Labs  04/07/12 1010  INR 1.04    EXAM: General - Patient is Alert, Appropriate and Oriented Extremity - Neurovascular intact Sensation intact distally Dorsiflexion/Plantar flexion intact  Dressing - clean, dry Motor Function - intact, moving foot and toes well on exam.   Assessment/Plan: 1 Day Post-Op Procedure(s) (LRB): CANNULATED HIP PINNING AND HARDWARE REVISION (Left) Procedure(s) (LRB): CANNULATED HIP PINNING AND HARDWARE REVISION (Left) Past Medical History  Diagnosis Date  . Anxiety   . Depression   . Hypertension   . HIV infection 2000  . Asthma     HOSPITALIZED WITH EXCERBATION OF ASTHMA - AND BRONCHITIS AND THE FLU  DEC 2013  . Shortness of breath     ALLERGIES ARE "ACTING UP"  . Diabetes mellitus     ON INSULIN AND ORAL MEDICATIONS  . GERD (gastroesophageal reflux disease)   . Neuromuscular disorder     NEUROPATHY HANDS AND FEET  . Arthritis     LEFT HIP  . Pain     SEVERE PAIN LEFT HIP - HX OF LEFT HIP PINNING JULY 2013   Active Problems:   * No active hospital problems. *  Estimated body mass index is 34.46 kg/(m^2) as calculated from the following:   Height as of this encounter: 5\' 11"  (1.803 m).   Weight as of this encounter: 112.038 kg (247 lb). Up with therapy Discharge home Diet - Cardiac diet Follow up - in 2 weeks Activity - WBAT Disposition - Home Condition Upon Discharge - Stable D/C Meds - See DC Summary DVT Prophylaxis - Aspirin 81 mg Daily  PERKINS, ALEXZANDREW 04/10/2012, 6:44 AM

## 2012-04-10 NOTE — Op Note (Signed)
NAMEJOSHLYN, [name]             ACCOUNT NO.:  [id number]  MEDICAL RECORD NO.:  [id number]  LOCATION:  [id number]                         FACILITY:  [facility]  PHYSICIAN:  Gaynelle Arabian, M.D.    DATE OF BIRTH:  09/07/1965  DATE OF PROCEDURE:  04/09/2012 DATE OF DISCHARGE:                              OPERATIVE REPORT   PREOPERATIVE DIAGNOSIS:  Painful hardware, left hip.  POSTOPERATIVE DIAGNOSIS:  Painful hardware, left hip.  PROCEDURE:  Hardware revision, left hip.  SURGEON:  Gaynelle Arabian, MD  ASSISTANT:  No assistant.  ANESTHESIA:  General.  ESTIMATED BLOOD LOSS:  Minimal.  DRAINS:  None.  COMPLICATIONS:  None.  CONDITION:  Stable to recovery.  BRIEF CLINICAL NOTE:  [name] is a 47 year old female, had a left femoral neck fracture last summer.  She was in-situ pinning.  She has had some slight collapse of the neck, but the femoral head has remained normal and the fracture just about fully healed on the CT scan.  The screws are starting to migrate laterally, just getting a lot of pain from the screws.  At this time, I decided to go ahead and remove the screws and replace at least 2 of them with shorter length screws.  They will not irritate her lateral tissues.  She presents now for that procedure.  PROCEDURE IN DETAIL:  After successful administration of general anesthetic, the patient is placed on a fracture table with her left lower extremity well-padded traction boot, right lower extremity in a well-padded leg holder.  Her left thigh was then prepped and draped in usual sterile fashion.  Previous lateral incisions were utilized.  Skin cut with a 10 blade through subcutaneous tissue.  I was able to palpate the screw heads.  I placed a guide pin for the central screw head and I removed that screw.  I then placed another guide pin through the anterior inferior screw and changed that out from a 100-mm screw to an 85-mm screw.  We tightened all way down to the lateral cortex  with excellent purchase.  The superior most screw was then also __________ in the same fashion, placed a guide pin through it, and changing it from a 95 to an 80-mm screw.  __________ excellent purchase.  This was all way up against the lateral cortex and good purchase in the femoral head. The C-arm was then removed and the wound copiously irrigated with saline solution.  The deep tissues were closed with interrupted #1 Vicryl, subcu interrupted 2-0 Vicryl, and subcuticular running 4-0 Monocryl.  A 20 mL of 0.25% Marcaine with epinephrine injected into the subcu and deep tissues. Steri-Strips and a bulky sterile dressing were applied, and she was then awakened and transported to recovery in stable condition.     Gaynelle Arabian, M.D.     FA/MEDQ  D:  04/09/2012  T:  04/10/2012  Job:  [id number]

## 2012-04-10 NOTE — Evaluation (Signed)
Physical Therapy Evaluation Patient Details Name: [name] MRN: [id number] DOB: [birth year] Today's Date: 04/10/2012 Time: 1050-1105 PT Time Calculation (min): 15 min  PT Assessment / Plan / Recommendation Clinical Impression  47 yo female s/p L hip pinning and hardware revision. On eval, pt required Min-guard to S level assist. Encouraged pt to use RW until stability improves and pain level decreases. Pt declined stair practice-stated she felt comfortable. Also reviewed (did not practice) LE exercises pt can perform at home once able-issued handout.  No follow up PT needs at this time. Will follow up with MD about PT needs.    PT Assessment  Patient needs continued PT services;All further PT needs can be met in the next venue of care    Follow Up Recommendations  No PT follow up (will follow up with MD about PT needs in 2 weeks)    Does the patient have the potential to tolerate intense rehabilitation      Barriers to Discharge        Equipment Recommendations  Rolling walker with 5" wheels    Recommendations for Other Services     Frequency      Precautions / Restrictions Precautions Precautions: Fall Restrictions Weight Bearing Restrictions: No LLE Weight Bearing: Weight bearing as tolerated   Pertinent Vitals/Pain 8/10 L hip      Mobility  Bed Mobility Bed Mobility: Supine to Sit Supine to Sit: 5: Supervision Transfers Transfers: Sit to Stand;Stand to Sit Sit to Stand: 5: Supervision;From bed Stand to Sit: 5: Supervision;To bed Details for Transfer Assistance: VCs safety.  Ambulation/Gait Ambulation/Gait Assistance: 4: Min guard Ambulation Distance (Feet): 100 Feet Assistive device: Rolling walker Ambulation/Gait Assistance Details: Encouraged pt to use walker until stability improves Gait Pattern: Antalgic;Decreased stride length Stairs: No (pt declined to practice-she felt comfortable)    Exercises     PT Diagnosis: Abnormality of gait;Difficulty  walking;Acute pain  PT Problem List: Decreased strength;Decreased range of motion;Decreased activity tolerance;Decreased mobility;Pain PT Treatment Interventions:     PT Goals    Visit Information  Last PT Received On: 04/10/12 Assistance Needed: +1    Subjective Data  Subjective: I can walk. Im just a little unsteady with my cane. Patient Stated Goal: home   Prior [place] Lives With: Son Available Help at Discharge: Family Type of Home: House Home Access: Stairs to enter Technical brewer of Steps: 2 Entrance Stairs-Rails: Right Home Adaptive Equipment: Straight cane Prior Function Level of Independence: Independent with assistive device(s) Able to Take Stairs?: Yes Communication Communication: No difficulties    Cognition  Cognition Overall Cognitive Status: Appears within functional limits for tasks assessed/performed Arousal/Alertness: Awake/alert Orientation Level: Appears intact for tasks assessed Behavior During Session: [facility] for tasks performed    Extremity/Trunk Assessment Right Lower Extremity Assessment RLE ROM/Strength/Tone: [facility] for tasks assessed Left Lower Extremity Assessment LLE ROM/Strength/Tone: Unable to fully assess;Due to pain Trunk Assessment Trunk Assessment: Normal   Balance    End of Session PT - End of Session Activity Tolerance: Patient limited by pain Patient left: in bed;with call bell/phone within reach;with nursing in room  GP Functional Assessment Tool Used: clinical judgement Functional Limitation: Mobility: Walking and moving around Mobility: Walking and Moving Around Current Status [id number]): At least 1 percent but less than 20 percent impaired, limited or restricted Mobility: Walking and Moving Around Goal Status [phone]): At least 1 percent but less than 20 percent impaired, limited or restricted Mobility: Walking and Moving Around Discharge Status [phone]): At  least 1 percent but less than 20 percent impaired,  limited or restricted   Weston Anna, MPT Pager: [phone]

## 2012-04-10 NOTE — Progress Notes (Signed)
Pt stable, scripts, d/c instructions and walker given.  No questions/concerns voiced by pt.  Pt transported via wheelchair to private vehicle, where family member waits to take her home, with NT.

## 2012-04-16 ENCOUNTER — Telehealth: Payer: Self-pay | Admitting: *Deleted

## 2012-04-16 NOTE — Telephone Encounter (Signed)
Received forms from patient's lawyer for a physical assessment for disability. Last seen 07/2011, per Dr. Johnnye Sima patient needs visit. Left message stating this on her voice mail. [name]

## 2012-04-21 ENCOUNTER — Encounter: Payer: Self-pay | Admitting: *Deleted

## 2012-04-22 ENCOUNTER — Encounter: Payer: Self-pay | Admitting: Infectious Diseases

## 2012-04-22 ENCOUNTER — Ambulatory Visit (INDEPENDENT_AMBULATORY_CARE_PROVIDER_SITE_OTHER): Payer: No Typology Code available for payment source | Admitting: Infectious Diseases

## 2012-04-22 VITALS — BP 116/71 | HR 77 | Temp 98.1°F | Ht 70.0 in | Wt 263.0 lb

## 2012-04-22 DIAGNOSIS — Z113 Encounter for screening for infections with a predominantly sexual mode of transmission: Secondary | ICD-10-CM

## 2012-04-22 DIAGNOSIS — B20 Human immunodeficiency virus [HIV] disease: Secondary | ICD-10-CM

## 2012-04-22 DIAGNOSIS — F17200 Nicotine dependence, unspecified, uncomplicated: Secondary | ICD-10-CM

## 2012-04-22 DIAGNOSIS — E785 Hyperlipidemia, unspecified: Secondary | ICD-10-CM

## 2012-04-22 DIAGNOSIS — E119 Type 2 diabetes mellitus without complications: Secondary | ICD-10-CM

## 2012-04-22 DIAGNOSIS — Z23 Encounter for immunization: Secondary | ICD-10-CM

## 2012-04-22 DIAGNOSIS — Z79899 Other long term (current) drug therapy: Secondary | ICD-10-CM

## 2012-04-22 LAB — CBC
HCT: 34.6 % — ABNORMAL LOW (ref 36.0–46.0)
MCHC: 34.1 g/dL (ref 30.0–36.0)
RDW: 18.2 % — ABNORMAL HIGH (ref 11.5–15.5)

## 2012-04-22 MED ORDER — DIPHENOXYLATE-ATROPINE 2.5-0.025 MG PO TABS: 1.0000 | ORAL_TABLET | Freq: Four times a day (QID) | ORAL | Status: DC | PRN

## 2012-04-22 MED ORDER — CROFELEMER 125 MG PO TBEC: 1.0000 | DELAYED_RELEASE_TABLET | Freq: Two times a day (BID) | ORAL | Status: DC

## 2012-04-22 MED ORDER — DIAZEPAM 5 MG PO TABS: 2.5000 mg | ORAL_TABLET | Freq: Every evening | ORAL | Status: DC | PRN

## 2012-04-22 NOTE — Assessment & Plan Note (Addendum)
Will recheck her lipids today. Previously taking fish oil.

## 2012-04-22 NOTE — Assessment & Plan Note (Signed)
Doing ok, appreciate pcp f/u. Needs better FSG control.

## 2012-04-22 NOTE — Assessment & Plan Note (Signed)
Continue to encourage her to quit

## 2012-04-22 NOTE — Progress Notes (Signed)
  Subjective:    Patient ID: [name], female    DOB: [birth year], 47 y.o.   MRN: [id number]  HPI 47 yo F with HIV+ since 2009, DM2 with retinopathy + neuropathy, hyperlipidemia, obesity. Also with mild Cr elevation. Taking atripla (only rx).  Trying to get disability based on her HIV, neuropathy (has constant pain in her hands), DM, asthma.  Was in hospital 07-16-11 to 07-21-11 with L hip fracture and pinning and screws after falling.  She underwent revision of this 04-10-12 due to pain.  Today she complains of continued pain. Tired all the time, continued diarrhea (taking imodium), dizziness.  FSG have been "pretty good"- highest 225, usually 130-140. No problems with her atripla. Has pruritis which she relates to the [place]. Takes valium for relief from this. Would like refill today.  Has tried to quit to smoking a couple of times, unable to stop.  States her antidepressant was recently doubled (respiridone 4mg ).  HIV 1 RNA Quant (copies/mL)  Date Value  07/30/2011 <20   01/03/2011 70*  10/17/2010 273*     CD4 T Cell Abs (cmm)  Date Value  12/28/2011 30*  07/30/2011 430   01/03/2011 380*    Review of Systems  Constitutional: Negative for appetite change and unexpected weight change.  Gastrointestinal: Positive for diarrhea. Negative for constipation.  Genitourinary: Negative for difficulty urinating.       Objective:   Physical Exam  Constitutional: She appears well-developed and well-nourished.  HENT:  Mouth/Throat: No oropharyngeal exudate.  Eyes: EOM are normal. Pupils are equal, round, and reactive to light.  Neck: Neck supple.  Cardiovascular: Normal rate, regular rhythm and normal heart sounds.   Pulmonary/Chest: Effort normal and breath sounds normal.  Abdominal: Soft. Bowel sounds are normal. There is no tenderness.  Musculoskeletal:       Legs: Lymphadenopathy:    She has no cervical adenopathy.          Assessment & Plan:

## 2012-04-22 NOTE — Assessment & Plan Note (Addendum)
Will repeat her labs today. She is not sexually active. Will fill out her disability forms when she sends them in. Will try to get her fulyzaq. Spoke with her about changing her ART, she does not want to do this.

## 2012-04-23 LAB — COMPREHENSIVE METABOLIC PANEL
ALT: 8 U/L (ref 0–35)
Alkaline Phosphatase: 75 U/L (ref 39–117)
CO2: 27 mEq/L (ref 19–32)
Sodium: 135 mEq/L (ref 135–145)
Total Bilirubin: 0.2 mg/dL — ABNORMAL LOW (ref 0.3–1.2)
Total Protein: 7.3 g/dL (ref 6.0–8.3)

## 2012-04-23 LAB — LIPID PANEL
HDL: 47 mg/dL (ref >39)
Total CHOL/HDL Ratio: 5.2 Ratio

## 2012-04-28 ENCOUNTER — Encounter: Payer: Self-pay | Admitting: Infectious Diseases

## 2012-04-28 LAB — HLA B*5701

## 2012-05-02 ENCOUNTER — Other Ambulatory Visit: Payer: Self-pay | Admitting: Licensed Clinical Social Worker

## 2012-05-02 DIAGNOSIS — B20 Human immunodeficiency virus [HIV] disease: Secondary | ICD-10-CM

## 2012-05-02 MED ORDER — EFAVIRENZ-EMTRICITAB-TENOFOVIR 600-200-300 MG PO TABS: 1.0000 | ORAL_TABLET | Freq: Every day | ORAL | Status: DC

## 2012-06-18 ENCOUNTER — Telehealth: Payer: Self-pay | Admitting: General Practice

## 2012-06-18 NOTE — Telephone Encounter (Signed)
Patient called and left message stating she is a patient of Dr Christoper Fabian and saw her back in February and was given refills till her surgery which she had in April and now she is out of refills and needs another refill for at least a month and still can't come in to be seen because of her surgery and she cant put her legs in stirrups yet & would like a call back

## 2012-06-18 NOTE — Telephone Encounter (Signed)
Pt left another message requesting refill of estradiol.  I returned her call and discussed with her that from her chart notes it appears that the intention was that she was to d/c the medication upon d/c following her surgery in April. Pt states that was not her understanding and that she has been taking it all along. She took her last pill today. She further states that she "knows how she gets" when she is not taking the medication and she has severe post menopausal sx. I informed her that Dr. Henrene Pastor is not working this week and it will be next week before I can ge ta response from her regarding a refill. Pt asked if one of the other doctors could refill- even if just for a short period of time. She knows she is due for a Pap test but still cannot put her legs in stirrups. She is still using a walker. I stated that I will check with another MD tomorrow and call her back. Pt voiced understanding.

## 2012-06-23 ENCOUNTER — Other Ambulatory Visit: Payer: Self-pay | Admitting: Family Medicine

## 2012-06-23 MED ORDER — ESTRADIOL 2 MG PO TABS: 2.0000 mg | ORAL_TABLET | Freq: Every day | ORAL | Status: DC

## 2012-06-23 MED ORDER — MEDROXYPROGESTERONE ACETATE 2.5 MG PO TABS: 2.5000 mg | ORAL_TABLET | Freq: Every day | ORAL | Status: DC

## 2012-06-23 NOTE — Telephone Encounter (Signed)
Spoke with Dr.  Christoper Fabian and Christoper Fabian informed me that she would contact the pt. About further management.

## 2012-06-25 ENCOUNTER — Telehealth: Payer: Self-pay | Admitting: *Deleted

## 2012-06-25 NOTE — Telephone Encounter (Signed)
Called pt and left message to return call to the clinics.  

## 2012-06-25 NOTE — Telephone Encounter (Signed)
[name] called and left a message states she has been calling about everyday for the last week and hasn't heard anything. States she is calling about needing a refill of her estradiol- states still can't get in the stirrups because of her broken hip and is still in theraphy for about 6 more weeks.  States she really needs the estradiol. States she is having hot flashes, night sweats, mood swings and doesn't understand why no one called her back. Per chart review a nurse did call her back and discussed with her med to be d'c 'd with surgery and would discuss with Dr. Christoper Fabian- per chart Dr. Christoper Fabian to call patient . Will refer to Dr. Christoper Fabian

## 2012-06-25 NOTE — Telephone Encounter (Signed)
Called pt and spoke with pt that Dr. Christoper Fabian has a concern about her being post surgery and the possiblity of a blood clot.  Pt asked "well why did she just refilled before".  I explained to the pt that now she has had two surgeries and it makes her a high risk for a blood clot along with her other medications that she is taking.  I strongly advised pt that we make an appt to see Dr. Christoper Fabian so that they can discuss possible other management.  Pt stated "sure, that will be fine".  Appt scheduled for July 3rd at 215 pm with Dr. Christoper Fabian.  Pt agreed and had no further questions.

## 2012-07-03 ENCOUNTER — Ambulatory Visit (INDEPENDENT_AMBULATORY_CARE_PROVIDER_SITE_OTHER): Payer: No Typology Code available for payment source | Admitting: Family Medicine

## 2012-07-03 ENCOUNTER — Encounter: Payer: Self-pay | Admitting: Family Medicine

## 2012-07-03 VITALS — BP 141/90 | HR 86 | Temp 97.6°F | Ht 69.0 in | Wt 265.0 lb

## 2012-07-03 DIAGNOSIS — N951 Menopausal and female climacteric states: Secondary | ICD-10-CM

## 2012-07-03 MED ORDER — MEDROXYPROGESTERONE ACETATE 2.5 MG PO TABS: 2.5000 mg | ORAL_TABLET | Freq: Every day | ORAL | Status: DC

## 2012-07-03 MED ORDER — ESTRADIOL 2 MG PO TABS: 2.0000 mg | ORAL_TABLET | Freq: Every day | ORAL | Status: DC

## 2012-07-03 NOTE — Progress Notes (Signed)
Patient ID: [name], female   DOB: Jul 28, 1965, 47 y.o.   MRN: [id number]  S:  Pt is perimenopausal, LMP in 2012. Suffers from severe hot flashes/sweats and severe mood swings. Has been on HRT since 2012. Started on 1 mg estradiol but now on 2 mg, as 1 mg did not work. She takes Provera 10 mg for 5 days of each month. This does not produce a withdrawal bleed, and she has not had any spotting or bleeding in over a year.  She denies pelvic pain, vaginal discharge, urinary or bowel problems.  Pt has multiple cardiovascular risk factors (see below) and recently underwent 2 hip surgeries. She is currently doing physical therapy and hopes to be well recovered in 5-6 weeks. She is a smoker and states that she has not had a cigarette in 6 weeks. The patient has HIV and is due for her annual pap smear but is not physically able to undergo a pelvic exam due to hip injury. She ran out of her hormones about a month ago and states that her symptoms are severe and disabling again. She sees a behavioral health specialist and was doing very well on Risperdal and Lamictal until her HRT ran out. She is "desperate" to get back on HRT and states that she will not leave the office without a prescription.  Her last A1C on 12/31/11 was 10.3. Her total cholesterol 2 months ago was 246, with HDL 47 and triglycerides 540. Her HIV viral load is negligible.  Patient Active Problem List   Diagnosis Date Noted  . Perimenopausal symptoms 02/13/2012  . Hyponatremia 12/29/2011  . Asthma exacerbation attacks 12/28/2011  . Bronchitis 12/28/2011  . Repeated falls 07/30/2011  . Hip fracture, left 07/16/2011  . Femoral neck fracture 07/16/2011  . Tobacco use disorder 04/14/2010  . HYPERLIPIDEMIA 12/23/2008  . ACUTE BRONCHITIS 11/29/2008  . DIARRHEA 08/30/2008  . FATIGUE 07/12/2008  . THRUSH 05/20/2008  . HIV DISEASE 04/19/2008  . DIABETES MELLITUS, TYPE II 04/19/2008  . DEPRESSION 04/19/2008  . PERIPHERAL NEUROPATHY  04/19/2008  . HYPERTENSION 04/19/2008   Current Outpatient Prescriptions on File Prior to Visit  Medication Sig Dispense Refill  . albuterol (PROVENTIL) (2.5 MG/3ML) 0.083% nebulizer solution Take 2.5 mg by nebulization every 6 (six) hours as needed for wheezing. For wheezing or shortness of breath      . atenolol (TENORMIN) 25 MG tablet Take 25 mg by mouth every morning.       . budesonide-formoterol (SYMBICORT) 80-4.5 MCG/ACT inhaler Inhale 2 puffs into the lungs 2 (two) times daily.      . carbamazepine (TEGRETOL XR) 200 MG 12 hr tablet Take 600 mg by mouth 2 (two) times daily.      [name] Kitchen dextromethorphan-guaiFENesin (MUCINEX DM) 30-600 MG per 12 hr tablet Take 1 tablet by mouth 2 (two) times daily as needed (congestion).      . diazepam (VALIUM) 5 MG tablet Take 0.5-1 tablets (2.5-5 mg total) by mouth at bedtime as needed for anxiety or sleep (7.5 MG DAILY  --TAKES 5MG  EVERY NIGHT AND 2.5MG  DURING DAY IF NEEDED).  30 tablet  0  . diphenoxylate-atropine (LOMOTIL) 2.5-0.025 MG per tablet Take 1 tablet by mouth 4 (four) times daily as needed for diarrhea or loose stools.  30 tablet  0  . efavirenz-emtricitabine-tenofovir (ATRIPLA) 600-200-300 MG per tablet Take 1 tablet by mouth at bedtime.  30 tablet  3  . fish oil-omega-3 fatty acids 1000 MG capsule Take 1 g by mouth 2 (  two) times daily.      . fluconazole (DIFLUCAN) 100 MG tablet Take 100 mg by mouth daily.      [name] Kitchen gabapentin (NEURONTIN) 300 MG capsule Take 300 mg by mouth 3 (three) times daily.      [name] Kitchen gemfibrozil (LOPID) 600 MG tablet Take 600 mg by mouth 2 (two) times daily before a meal. 1/2 hour before meals      . glipiZIDE (GLUCOTROL) 10 MG tablet Take 10 mg by mouth 2 (two) times daily before a meal.      . hydrochlorothiazide (HYDRODIURIL) 25 MG tablet Take 25 mg by mouth every morning.       . insulin aspart (NOVOLOG) 100 UNIT/ML injection Inject 25 Units into the skin 3 (three) times daily before meals. Pt takes 15 units at each meals but  sometimes may need more.      . insulin aspart (NOVOLOG) 100 UNIT/ML injection Inject 0-20 Units into the skin 3 (three) times daily with meals.  1 vial  0  . insulin glargine (LANTUS) 100 UNIT/ML injection Inject 90 Units into the skin 2 (two) times daily.       . metFORMIN (GLUCOPHAGE) 1000 MG tablet Take 1,000 mg by mouth 2 (two) times daily with a meal.      . methocarbamol (ROBAXIN) 500 MG tablet Take 1 tablet (500 mg total) by mouth every 6 (six) hours as needed. Every 6-8 hours as needed for muscle spasms  80 tablet  0  . mometasone (NASONEX) 50 MCG/ACT nasal spray Place 2 sprays into the nose daily.      . Multiple Vitamins-Minerals (MULTIVITAMIN WITH MINERALS) tablet Take 1 tablet by mouth daily.      . pantoprazole (PROTONIX) 40 MG tablet Take 40 mg by mouth every morning.       . risperiDONE (RISPERDAL) 1 MG tablet Take 4 mg by mouth daily.       . traMADol (ULTRAM) 50 MG tablet Take 50 mg by mouth every 6 (six) hours as needed for pain.      . vitamin E (VITAMIN E) 1000 UNIT capsule Take 5,000 Units by mouth 2 (two) times daily.      . Crofelemer (FULYZAQ) 125 MG TBEC Take 1 tablet by mouth 2 (two) times daily.  60 tablet  3  . oxyCODONE (OXY IR/ROXICODONE) 5 MG immediate release tablet Take 1-2 tablets (5-10 mg total) by mouth every 3 (three) hours as needed.  80 tablet  0   No current facility-administered medications on file prior to visit.    O: Filed Vitals:   07/03/12 1428  BP: 141/90  Pulse: 86  Temp: 97.6 F (36.4 C)   GEN:  Morbidly obese, no distress, ambulates with cane HEENT:  NCAT, EOMI, conjunctiva clear CV: RRR, no murmur RESP:  Bilateral diffuse wheezing, no respiratory distress EXTREM:  Warm, well perfused, no edema or tenderness NEURO:  Alert, oriented, no focal deficits GU:  Deferred due to patient discomfort and recent hip surgery  ASSESSMENT/PLAN HIV - managed by Infectious disease, on ART DM:  Needs to follow up with PCP for A!C level, poorly  controlled as of 12/13. SMoking: claims to have quit, congratulated pt HTN:  Fair control, 140/90 today Menopausal symptoms:  Poorly controlled off HRT  Discussed risks of HRT in regards to blood clots and longterm cardiovascular health, especially with her multiple risk factors (DM, HTN, Obesity, Hyperlidemia and smoking). Discussed my reluctance to continue this medication, especially given recent hip surgeries and  decreased disability. Patient states that she is becoming more active, is not immobile but is doing therapy and walking daily. She expresses understanding of the risks of HRT but insists that the benefits outweigh the risks for her at this time, as her symptoms are so disabling. Discussed importance of mobility and continued smoking cessation. Suggested change in therapy to decreased estradiol to 1 mg daily and increase Provera to daily dose. Patient states that she does not want to change anything at this time as it was working so well for her - she will consider changes in therapy once she has better recovered from hip surgery.  Also discussed importance of annual pap smear in light of her HIV. Patient is aware that she is overdue for pap smear and states she hopes to be able to tolerate pelvic exam in 6-8 weeks after her PT for hip surgery is completed.   Refilled 3 months of HRT. To follow up in 8-10 weeks.

## 2012-07-03 NOTE — Patient Instructions (Signed)
Hormonal Therapy for Women, Frequently Asked Questions WHAT IS HORMONE THERAPY? Hormone therapy (HT), estrogen and progesterone, provides women with the female hormones that decrease and are lost as women get older. When the hormone estrogen is given alone, it is usually referred to as "ERT." When the hormone progesterone is combined with estrogen, it is generally called "HT." Previously this was known as hormone replacement therapy (HRT). Estrogen is a female hormone that brings about changes in various organs in the body. Progesterone is a female hormone that prepares the uterus for a pregnancy each month. During the change-over to menopause ("perimenopause") these hormone levels start to decrease. This causes many uncomfortable symptoms (see below). When the ovaries stop producing estrogen and progesterone, menstrual periods come to an end. At this point, the woman has experienced menopause. Menopause is complete when a woman misses 12 consecutive menstrual periods. WHAT ARE THE BENEFITS OF HORMONE THERAPY? Hormone therapy has been used to relieve the short-term symptoms of menopause. These include:  Hot flashes.  Depression.  Memory loss.  Correcting irregular menstrual periods.  Night sweats.  Tiredness.  Mood disturbances.  Thinning of scalp hair.  Disturbed sleep.  Vaginal dryness.  Painful intercourse.  Loss of breast tissue. Evidence shows that HT may be helpful in preventing colon cancer and bone loss (osteoporosis). WHAT ARE THE SHORT-TERM RISKS OF HORMONE THERAPY?  Some women report side effects from taking Hormone Therapy, including:  Feeling sick to stomach (nausea).  Fluid retention.  Swollen breasts.  Acne, when taking HT with progesterone.  Unusual vaginal discharge and bleeding (if the uterus is present).  Headaches.  Some women think HT will make them gain weight. Research now shows this is not true. Some women do gain weight during menopause, but this  is because their metabolism slows down as they age. They also may not be increasing their amount or level of physical activity as they get older.  Short-term benefits or side effects should become noticeable within days, weeks, or sometimes months after treatment begins. LONG-TERM RISKS These will not be easily noticeable for each individual woman. There are many factors involved that can contribute to long-term risks and side effects. CANCER There is concern that HT can increase the risk of some cancers, including endometrial cancer (lining of the uterus), breast, and certain (but not all) ovarian or cervix cancers, such as endometriod ovarian cancer.  When estrogen is taken alone, it raises the risk of endometrial cancer, if the uterus is still present. Adding progestin with estrogen (HT) can greatly reduce this risk. Progestin is added to prevent the overgrowth (hyperplasia) of cells in the uterine lining. Women who still have an intact uterus are generally given this combined therapy and should not take estrogen hormone alone without progesterone. HT with estrogen and progestin has been linked to an increased risk of invasive breast cancer. Women who use estrogen plus progestin for four years or longer are more likely to develop breast cancer than women who have not used them for as long. This indicates that the therapy may have a cumulative effect. The decision to take HT should be based on an overall look at the risk and benefits, and how they fit with your personal and genetic health profile. Conditions that increase the underlying risk of developing breast cancer include:  Family history of breast cancer.  Early age of the first menstrual period (menarche).  Late age of child bearing.  High fat diet.  Late menopause.  Obesity.  Increased breast density  on mammograms.  Certain non-cancerous (benign) breast lesions.  Excessive use of alcohol.  Extensive radiation exposure to the  chest. These factors need to be considered when deciding to take HT. If you are currently taking HT and have concerns, talk with your caregiver as soon as possible.  BREAST DENSITY Taking both estrogen and progestin also can affect a woman's breast density. Increased breast density from HT makes it hard for a radiologist to read some special breast x-rays (mammograms). This leads to the need for follow-up mammograms, ultrasound or MRI (magnetic resonance imaging), or taking breast tissue samples that are surgically removed (biopsies). Increased density also is a concern because studies have shown that women age 19 and older, whose mammograms show at least 48 percent dense tissue, are at increased risk for breast cancer. However, it is not known if increased breast density due to HT carries the same risk for breast cancer as having naturally dense breasts. About 25 percent of women who use combined HT have an increase in breast density on their mammograms. This is compared to about 8 percent of women taking estrogen alone. One study showed that stopping HT for about 2 weeks before having a mammogram improved the readability of the mammogram. But further research is needed to confirm the usefulness of this approach. HEART DISEASE In the past, taking HT (estrogen plus progestin) was thought to help protect women against heart disease. However, recent findings show that taking HT poses more risks than benefits. HT could increase a woman's risk for:  Heart disease.  Stroke.  Blood clot in the lung (pulmonary embolism).  Breast cancer.  Blood clots in the legs. Women who have gone through menopause should not be given HT to prevent heart disease and other chronic conditions.  Women who have gone through menopause and who have heart disease, may have a greater risk of another cardiac event (like heart attack) after starting HT, at least in the short-term. For women who have had strokes, their risk for  having another stroke goes up when they start taking HT. Hormones are not recommended for women with heart disease or for women who have had a stroke. If you have gone through menopause, talk with your caregiver about whether hormones are right for you. You can check the [place] website ([place]) for updates on postmenopausal hormone therapy. OTHER RISKS INCLUDE:  Developing high blood pressure.  Developing gallbladder disease.  Women with a fibroid non-cancerous tumor on the uterus may develop pain, bleeding or increase growth of the fibroid. If you are taking HT, watch for signs of trouble. These include:  Abnormal bleeding.  Breast lumps, bloody discharge or red/painful breasts.  Shortness of breath.  Dizziness.  Abdominal pain.  Severe headaches.  Pain in your calves or chest. Report these signs to your caregiver right away. Also, talk with your caregiver about how often you should have an exam. DOES THE DURATION OF TAKING HT AFFECT BREAST CANCER RISK? The relationship between a woman's risk of developing breast cancer and the length of time that she receives HT is not clear. Some women take HT for only a few years until the worst of their menopausal symptoms have passed. Others have taken it for 10 years or more. Some researchers believe that there is little or no increased risk of breast cancer associated with short-term use of either HT with estrogen alone or estrogen combined with progestin. But long-term use is linked to an increased risk. Women on  HT should continue to do monthly self breast exams and get their mammograms as recommended by their caregiver. WHY IS MENOPAUSAL HORMONE THERAPY USED IN SPITE OF THE CANCER RISK? The known benefits of HT can improve the quality of life for many women, by reducing uncomfortable symptoms, as mentioned above. There also is evidence that HT helps prevent and treats osteoporosis. There is  preliminary evidence that it can help prevent other problems associated with age, including colon cancer. The addition of progestin to the treatment has greatly reduced the risk of uterine cancer. ARE THERE OTHER DRUG THERAPIES KNOWN TO TREAT CONDITIONS RELATED TO MENOPAUSE? A class of antidepressant drugs called Selective Serotonin Reuptake Inhibitors (SSRIs) are effective in treating menopause-related symptoms of depression or mood changes. Vitamin E and Clonidine (drug typically used for high blood pressure) can help reduce hot flashes. To prevent osteoporosis, women who are at high risk for bone loss may be given drugs such as bisphosphonates, alendronate, raloxifene, calcium with vitamin D, calcitonin, and prescription medicines such as fasomax or boneva. Lastly, a class of cholesterol-lowering drugs called HMG-CoA-reductase inhibitors (statins) are proven to be effective for reducing risk of heart disease. They are also being explored to prevent osteoporosis. No alternatives to estrogen exist for prevention of colon cancer  a disease for which early evidence suggests HT may be beneficial. WHO SHOULD NOT USE HT?  HT is often not recommended for women who have any of the following conditions:  Vaginal bleeding of an unknown cause.  Suspected breast cancer or history of breast cancer.  History of endometrial or uterine cancer.  Chronic disease of the liver.  History of heart disease.  History of blood clots in the veins or legs or in the lung (venous thrombosis). This includes women who have had thrombosis or blood clots during pregnancy or when taking birth control pills. Although the risk of blood clots in women is very low, HT increases the risk.  Severe or uncontrolled high blood pressure.  Anyone who may be pregnant. HOW CAN I SORT THROUGH THE BENEFITS AND RISKS TO MAKE A GOOD DECISION ABOUT WHETHER OR NOT TO USE POSTMENOPAUSAL HORMONE THERAPY? Here are several helpful points,  summarizing the findings of the [facility]) study:  First, it is important to know that because the study involved healthy women, only a small number of them had either a negative or positive effect from estrogen plus progestin therapy. The percentages describe what would happen to a whole population, not necessarily to any individual woman. Second, remember that percentages are not fate. Whether expressing risks or benefits, a percentage does not mean you will develop a disease. Many factors affect that likelihood, including:  Your lifestyle.  Environmental factors.  Heredity.  Your personal medical history. Realize that most treatments carry risks and benefits. No one can make a treatment choice for you. Talk with your caregiver and decide what is best for your health and quality of life. Begin by finding out your family history and your personal risk profile for:  Heart disease.  Stroke.  Breast cancer.  Osteoporosis.  Colorectal cancer.  Blood clots.  Other medical conditions. Document Released: 09/16/2002 Document Revised: 03/12/2011 Document Reviewed: 10/18/2008 [facility] Patient Information 2014 [place].

## 2012-07-25 ENCOUNTER — Emergency Department (HOSPITAL_COMMUNITY): Payer: Medicaid Other

## 2012-07-25 ENCOUNTER — Encounter (HOSPITAL_COMMUNITY): Payer: Self-pay | Admitting: Emergency Medicine

## 2012-07-25 ENCOUNTER — Inpatient Hospital Stay (HOSPITAL_COMMUNITY)
Admission: EM | Admit: 2012-07-25 | Discharge: 2012-07-31 | DRG: 975 | Disposition: A | Discharge status: Home / Self Care | Payer: Medicaid Other | Attending: Internal Medicine | Admitting: Internal Medicine

## 2012-07-25 DIAGNOSIS — J209 Acute bronchitis, unspecified: Secondary | ICD-10-CM

## 2012-07-25 DIAGNOSIS — R6520 Severe sepsis without septic shock: Secondary | ICD-10-CM

## 2012-07-25 DIAGNOSIS — F3289 Other specified depressive episodes: Secondary | ICD-10-CM

## 2012-07-25 DIAGNOSIS — Z794 Long term (current) use of insulin: Secondary | ICD-10-CM

## 2012-07-25 DIAGNOSIS — E1169 Type 2 diabetes mellitus with other specified complication: Secondary | ICD-10-CM | POA: Diagnosis present

## 2012-07-25 DIAGNOSIS — G5690 Unspecified mononeuropathy of unspecified upper limb: Secondary | ICD-10-CM | POA: Diagnosis present

## 2012-07-25 DIAGNOSIS — J4521 Mild intermittent asthma with (acute) exacerbation: Secondary | ICD-10-CM

## 2012-07-25 DIAGNOSIS — N951 Menopausal and female climacteric states: Secondary | ICD-10-CM

## 2012-07-25 DIAGNOSIS — I10 Essential (primary) hypertension: Secondary | ICD-10-CM

## 2012-07-25 DIAGNOSIS — G5790 Unspecified mononeuropathy of unspecified lower limb: Secondary | ICD-10-CM | POA: Diagnosis present

## 2012-07-25 DIAGNOSIS — R0902 Hypoxemia: Secondary | ICD-10-CM | POA: Diagnosis present

## 2012-07-25 DIAGNOSIS — Z96649 Presence of unspecified artificial hip joint: Secondary | ICD-10-CM

## 2012-07-25 DIAGNOSIS — E118 Type 2 diabetes mellitus with unspecified complications: Secondary | ICD-10-CM | POA: Diagnosis present

## 2012-07-25 DIAGNOSIS — B20 Human immunodeficiency virus [HIV] disease: Secondary | ICD-10-CM

## 2012-07-25 DIAGNOSIS — E785 Hyperlipidemia, unspecified: Secondary | ICD-10-CM

## 2012-07-25 DIAGNOSIS — R5381 Other malaise: Secondary | ICD-10-CM

## 2012-07-25 DIAGNOSIS — J189 Pneumonia, unspecified organism: Secondary | ICD-10-CM

## 2012-07-25 DIAGNOSIS — J40 Bronchitis, not specified as acute or chronic: Secondary | ICD-10-CM

## 2012-07-25 DIAGNOSIS — J9691 Respiratory failure, unspecified with hypoxia: Secondary | ICD-10-CM

## 2012-07-25 DIAGNOSIS — E1159 Type 2 diabetes mellitus with other circulatory complications: Secondary | ICD-10-CM | POA: Diagnosis present

## 2012-07-25 DIAGNOSIS — Z720 Tobacco use: Secondary | ICD-10-CM | POA: Diagnosis present

## 2012-07-25 DIAGNOSIS — M1610 Unilateral primary osteoarthritis, unspecified hip: Secondary | ICD-10-CM | POA: Diagnosis present

## 2012-07-25 DIAGNOSIS — K219 Gastro-esophageal reflux disease without esophagitis: Secondary | ICD-10-CM | POA: Diagnosis present

## 2012-07-25 DIAGNOSIS — E1165 Type 2 diabetes mellitus with hyperglycemia: Secondary | ICD-10-CM | POA: Diagnosis present

## 2012-07-25 DIAGNOSIS — A419 Sepsis, unspecified organism: Secondary | ICD-10-CM

## 2012-07-25 DIAGNOSIS — F39 Unspecified mood [affective] disorder: Secondary | ICD-10-CM | POA: Diagnosis present

## 2012-07-25 DIAGNOSIS — R197 Diarrhea, unspecified: Secondary | ICD-10-CM

## 2012-07-25 DIAGNOSIS — F411 Generalized anxiety disorder: Secondary | ICD-10-CM | POA: Diagnosis present

## 2012-07-25 DIAGNOSIS — R296 Repeated falls: Secondary | ICD-10-CM

## 2012-07-25 DIAGNOSIS — J441 Chronic obstructive pulmonary disease with (acute) exacerbation: Secondary | ICD-10-CM | POA: Diagnosis present

## 2012-07-25 DIAGNOSIS — B370 Candidal stomatitis: Secondary | ICD-10-CM

## 2012-07-25 DIAGNOSIS — F17200 Nicotine dependence, unspecified, uncomplicated: Secondary | ICD-10-CM

## 2012-07-25 DIAGNOSIS — E119 Type 2 diabetes mellitus without complications: Secondary | ICD-10-CM

## 2012-07-25 DIAGNOSIS — G609 Hereditary and idiopathic neuropathy, unspecified: Secondary | ICD-10-CM

## 2012-07-25 DIAGNOSIS — F329 Major depressive disorder, single episode, unspecified: Secondary | ICD-10-CM | POA: Diagnosis present

## 2012-07-25 DIAGNOSIS — Z21 Asymptomatic human immunodeficiency virus [HIV] infection status: Secondary | ICD-10-CM

## 2012-07-25 DIAGNOSIS — Z79899 Other long term (current) drug therapy: Secondary | ICD-10-CM

## 2012-07-25 DIAGNOSIS — E871 Hypo-osmolality and hyponatremia: Secondary | ICD-10-CM

## 2012-07-25 HISTORY — DX: Pneumonia, unspecified organism: J18.9

## 2012-07-25 LAB — BASIC METABOLIC PANEL
Calcium: 9.2 mg/dL (ref 8.4–10.5)
Chloride: 85 mEq/L — ABNORMAL LOW (ref 96–112)
Creatinine, Ser: 0.85 mg/dL (ref 0.50–1.10)
GFR calc Af Amer: >90 mL/min (ref >90)
GFR calc non Af Amer: 80 mL/min — ABNORMAL LOW (ref >90)

## 2012-07-25 LAB — PRO B NATRIURETIC PEPTIDE: Pro B Natriuretic peptide (BNP): 68.4 pg/mL (ref 0–125)

## 2012-07-25 LAB — POCT I-STAT, CHEM 8
Calcium, Ion: 1.08 mmol/L — ABNORMAL LOW (ref 1.12–1.23)
Glucose, Bld: 192 mg/dL — ABNORMAL HIGH (ref 70–99)
HCT: 37 % (ref 36.0–46.0)
Hemoglobin: 12.6 g/dL (ref 12.0–15.0)
Potassium: 4.7 mEq/L (ref 3.5–5.1)
TCO2: 26 mmol/L (ref 0–100)

## 2012-07-25 LAB — GLUCOSE, CAPILLARY: Glucose-Capillary: 343 mg/dL — ABNORMAL HIGH (ref 70–99)

## 2012-07-25 LAB — CBC WITH DIFFERENTIAL/PLATELET
Basophils Absolute: 0.1 10*3/uL (ref 0.0–0.1)
Basophils Relative: 0 % (ref 0–1)
HCT: 33.2 % — ABNORMAL LOW (ref 36.0–46.0)
MCHC: 32.8 g/dL (ref 30.0–36.0)
Monocytes Absolute: 0.6 10*3/uL (ref 0.1–1.0)
Neutro Abs: 16.4 10*3/uL — ABNORMAL HIGH (ref 1.7–7.7)
RDW: 17.5 % — ABNORMAL HIGH (ref 11.5–15.5)

## 2012-07-25 LAB — URINALYSIS, ROUTINE W REFLEX MICROSCOPIC
Ketones, ur: 15 mg/dL — AB
Leukocytes, UA: NEGATIVE
Nitrite: NEGATIVE
Protein, ur: NEGATIVE mg/dL

## 2012-07-25 MED ORDER — HYDROCODONE-ACETAMINOPHEN 5-325 MG PO TABS
2.0000 | ORAL_TABLET | Freq: Once | ORAL | Status: AC
  Administered 2012-07-25: 2 via ORAL
  Filled 2012-07-25: qty 2

## 2012-07-25 MED ORDER — METHYLPREDNISOLONE SODIUM SUCC 40 MG IJ SOLR
40.0000 mg | Freq: Four times a day (QID) | INTRAMUSCULAR | Status: DC
  Administered 2012-07-25 – 2012-07-29 (×15): 40 mg via INTRAVENOUS
  Filled 2012-07-25 (×18): qty 1

## 2012-07-25 MED ORDER — HEPARIN SODIUM (PORCINE) 5000 UNIT/ML IJ SOLN
5000.0000 [IU] | Freq: Three times a day (TID) | INTRAMUSCULAR | Status: DC
  Administered 2012-07-25 – 2012-07-31 (×16): 5000 [IU] via SUBCUTANEOUS
  Filled 2012-07-25 (×20): qty 1

## 2012-07-25 MED ORDER — CEFTRIAXONE SODIUM 1 G IJ SOLR
1.0000 g | Freq: Once | INTRAMUSCULAR | Status: AC
  Administered 2012-07-26: 1 g via INTRAVENOUS
  Filled 2012-07-25: qty 10

## 2012-07-25 MED ORDER — ATENOLOL 25 MG PO TABS
25.0000 mg | ORAL_TABLET | Freq: Every morning | ORAL | Status: DC
  Administered 2012-07-26 – 2012-07-31 (×6): 25 mg via ORAL
  Filled 2012-07-25 (×6): qty 1

## 2012-07-25 MED ORDER — OMEGA-3-ACID ETHYL ESTERS 1 G PO CAPS
1.0000 g | ORAL_CAPSULE | Freq: Two times a day (BID) | ORAL | Status: DC
  Administered 2012-07-25 – 2012-07-31 (×12): 1 g via ORAL
  Filled 2012-07-25 (×13): qty 1

## 2012-07-25 MED ORDER — ONDANSETRON HCL 4 MG/2ML IJ SOLN
4.0000 mg | Freq: Four times a day (QID) | INTRAMUSCULAR | Status: DC | PRN
  Administered 2012-07-26 – 2012-07-28 (×5): 4 mg via INTRAVENOUS
  Filled 2012-07-25 (×5): qty 2

## 2012-07-25 MED ORDER — METHOCARBAMOL 500 MG PO TABS
500.0000 mg | ORAL_TABLET | Freq: Four times a day (QID) | ORAL | Status: DC | PRN
  Filled 2012-07-25: qty 1

## 2012-07-25 MED ORDER — MORPHINE SULFATE 2 MG/ML IJ SOLN
2.0000 mg | INTRAMUSCULAR | Status: DC | PRN
  Administered 2012-07-25: 2 mg via INTRAVENOUS
  Filled 2012-07-25: qty 1

## 2012-07-25 MED ORDER — DIPHENOXYLATE-ATROPINE 2.5-0.025 MG PO TABS
1.0000 | ORAL_TABLET | Freq: Four times a day (QID) | ORAL | Status: DC | PRN
  Administered 2012-07-28 – 2012-07-29 (×4): 1 via ORAL
  Filled 2012-07-25 (×4): qty 1

## 2012-07-25 MED ORDER — ALBUTEROL SULFATE (5 MG/ML) 0.5% IN NEBU
2.5000 mg | INHALATION_SOLUTION | RESPIRATORY_TRACT | Status: DC | PRN
  Filled 2012-07-25 (×2): qty 0.5

## 2012-07-25 MED ORDER — MAGNESIUM SULFATE 40 MG/ML IJ SOLN
2.0000 g | Freq: Once | INTRAMUSCULAR | Status: AC
  Administered 2012-07-25: 2 g via INTRAVENOUS
  Filled 2012-07-25: qty 50

## 2012-07-25 MED ORDER — HYDROMORPHONE HCL PF 1 MG/ML IJ SOLN
1.0000 mg | Freq: Once | INTRAMUSCULAR | Status: AC
  Administered 2012-07-25: 1 mg via INTRAVENOUS
  Filled 2012-07-25: qty 1

## 2012-07-25 MED ORDER — DOCUSATE SODIUM 100 MG PO CAPS
100.0000 mg | ORAL_CAPSULE | Freq: Two times a day (BID) | ORAL | Status: DC
  Administered 2012-07-27 – 2012-07-31 (×3): 100 mg via ORAL
  Filled 2012-07-25 (×13): qty 1

## 2012-07-25 MED ORDER — OMEGA-3 FATTY ACIDS 1000 MG PO CAPS: 1.0000 g | ORAL_CAPSULE | Freq: Two times a day (BID) | ORAL | Status: DC

## 2012-07-25 MED ORDER — ONDANSETRON HCL 4 MG/2ML IJ SOLN: 4.0000 mg | Freq: Three times a day (TID) | INTRAMUSCULAR | Status: DC | PRN

## 2012-07-25 MED ORDER — FLUCONAZOLE 100 MG PO TABS
100.0000 mg | ORAL_TABLET | Freq: Every day | ORAL | Status: DC
Start: 2012-07-25 — End: 2012-07-31
  Administered 2012-07-25 – 2012-07-31 (×7): 100 mg via ORAL
  Filled 2012-07-25 (×7): qty 1

## 2012-07-25 MED ORDER — METHYLPREDNISOLONE SODIUM SUCC 125 MG IJ SOLR
125.0000 mg | Freq: Once | INTRAMUSCULAR | Status: AC
  Administered 2012-07-25: 125 mg via INTRAVENOUS
  Filled 2012-07-25: qty 2

## 2012-07-25 MED ORDER — INSULIN ASPART 100 UNIT/ML ~~LOC~~ SOLN
0.0000 [IU] | Freq: Three times a day (TID) | SUBCUTANEOUS | Status: DC
  Administered 2012-07-26: 11 [IU] via SUBCUTANEOUS
  Administered 2012-07-26: 7 [IU] via SUBCUTANEOUS
  Administered 2012-07-27: 15 [IU] via SUBCUTANEOUS
  Administered 2012-07-27 (×2): 4 [IU] via SUBCUTANEOUS
  Administered 2012-07-28: 15 [IU] via SUBCUTANEOUS
  Administered 2012-07-28: 3 [IU] via SUBCUTANEOUS
  Administered 2012-07-29 (×2): 20 [IU] via SUBCUTANEOUS
  Administered 2012-07-30: 15 [IU] via SUBCUTANEOUS
  Administered 2012-07-30 (×2): 3 [IU] via SUBCUTANEOUS
  Administered 2012-07-31: 11 [IU] via SUBCUTANEOUS
  Administered 2012-07-31: 7 [IU] via SUBCUTANEOUS

## 2012-07-25 MED ORDER — HYDROCHLOROTHIAZIDE 25 MG PO TABS
25.0000 mg | ORAL_TABLET | Freq: Every morning | ORAL | Status: DC
  Administered 2012-07-26 – 2012-07-31 (×6): 25 mg via ORAL
  Filled 2012-07-25 (×6): qty 1

## 2012-07-25 MED ORDER — FLUTICASONE PROPIONATE 50 MCG/ACT NA SUSP
1.0000 | Freq: Every day | NASAL | Status: DC
  Administered 2012-07-26 – 2012-07-31 (×6): 1 via NASAL
  Filled 2012-07-25: qty 16

## 2012-07-25 MED ORDER — INSULIN GLARGINE 100 UNIT/ML ~~LOC~~ SOLN
90.0000 [IU] | Freq: Every day | SUBCUTANEOUS | Status: DC
  Administered 2012-07-25 – 2012-07-29 (×4): 90 [IU] via SUBCUTANEOUS
  Filled 2012-07-25 (×6): qty 0.9

## 2012-07-25 MED ORDER — IOHEXOL 350 MG/ML SOLN
100.0000 mL | Freq: Once | INTRAVENOUS | Status: AC | PRN
  Administered 2012-07-25: 100 mL via INTRAVENOUS

## 2012-07-25 MED ORDER — OXCARBAZEPINE 300 MG PO TABS
600.0000 mg | ORAL_TABLET | Freq: Every day | ORAL | Status: DC
  Administered 2012-07-26 – 2012-07-30 (×5): 600 mg via ORAL
  Filled 2012-07-25 (×5): qty 2

## 2012-07-25 MED ORDER — INSULIN ASPART 100 UNIT/ML ~~LOC~~ SOLN
0.0000 [IU] | Freq: Every day | SUBCUTANEOUS | Status: DC
  Administered 2012-07-25: 4 [IU] via SUBCUTANEOUS

## 2012-07-25 MED ORDER — SODIUM CHLORIDE 0.9 % IV BOLUS (SEPSIS)
1000.0000 mL | Freq: Once | INTRAVENOUS | Status: AC
  Administered 2012-07-25: 1000 mL via INTRAVENOUS

## 2012-07-25 MED ORDER — DEXTROSE 5 % IV SOLN
500.0000 mg | Freq: Once | INTRAVENOUS | Status: AC
  Administered 2012-07-25: 500 mg via INTRAVENOUS
  Filled 2012-07-25: qty 500

## 2012-07-25 MED ORDER — EFAVIRENZ-EMTRICITAB-TENOFOVIR 600-200-300 MG PO TABS
1.0000 | ORAL_TABLET | Freq: Every day | ORAL | Status: DC
  Administered 2012-07-26 – 2012-07-30 (×5): 1 via ORAL
  Filled 2012-07-25 (×7): qty 1

## 2012-07-25 MED ORDER — SULFAMETHOXAZOLE-TMP DS 800-160 MG PO TABS
2.0000 | ORAL_TABLET | Freq: Once | ORAL | Status: AC
  Administered 2012-07-25: 2 via ORAL
  Filled 2012-07-25: qty 2

## 2012-07-25 MED ORDER — RISPERIDONE 2 MG PO TABS
4.0000 mg | ORAL_TABLET | Freq: Every day | ORAL | Status: DC
  Administered 2012-07-26 – 2012-07-29 (×4): 4 mg via ORAL
  Filled 2012-07-25 (×6): qty 2

## 2012-07-25 MED ORDER — INSULIN ASPART 100 UNIT/ML ~~LOC~~ SOLN
25.0000 [IU] | Freq: Three times a day (TID) | SUBCUTANEOUS | Status: DC
  Administered 2012-07-26 – 2012-07-29 (×10): 25 [IU] via SUBCUTANEOUS

## 2012-07-25 MED ORDER — CARBAMAZEPINE ER 400 MG PO TB12
600.0000 mg | ORAL_TABLET | Freq: Two times a day (BID) | ORAL | Status: DC
  Administered 2012-07-25 – 2012-07-31 (×12): 600 mg via ORAL
  Filled 2012-07-25 (×13): qty 1

## 2012-07-25 MED ORDER — GEMFIBROZIL 600 MG PO TABS
600.0000 mg | ORAL_TABLET | Freq: Two times a day (BID) | ORAL | Status: DC
  Administered 2012-07-26 – 2012-07-31 (×11): 600 mg via ORAL
  Filled 2012-07-25 (×14): qty 1

## 2012-07-25 MED ORDER — GLIPIZIDE 10 MG PO TABS
10.0000 mg | ORAL_TABLET | Freq: Two times a day (BID) | ORAL | Status: DC
  Administered 2012-07-26 – 2012-07-31 (×10): 10 mg via ORAL
  Filled 2012-07-25 (×13): qty 1

## 2012-07-25 MED ORDER — SULFAMETHOXAZOLE-TRIMETHOPRIM 400-80 MG/5ML IV SOLN
15.0000 mg/kg/d | Freq: Three times a day (TID) | INTRAVENOUS | Status: DC
  Administered 2012-07-26 (×2): 604.8 mg via INTRAVENOUS
  Filled 2012-07-25 (×5): qty 37.8

## 2012-07-25 MED ORDER — TRAMADOL HCL 50 MG PO TABS: 50.0000 mg | ORAL_TABLET | Freq: Four times a day (QID) | ORAL | Status: DC | PRN

## 2012-07-25 MED ORDER — LEVOFLOXACIN IN D5W 750 MG/150ML IV SOLN
750.0000 mg | INTRAVENOUS | Status: DC
  Administered 2012-07-26 – 2012-07-28 (×4): 750 mg via INTRAVENOUS
  Filled 2012-07-25 (×5): qty 150

## 2012-07-25 MED ORDER — PANTOPRAZOLE SODIUM 40 MG PO TBEC
40.0000 mg | DELAYED_RELEASE_TABLET | Freq: Every morning | ORAL | Status: DC
  Administered 2012-07-26 – 2012-07-31 (×6): 40 mg via ORAL
  Filled 2012-07-25 (×6): qty 1

## 2012-07-25 MED ORDER — OXCARBAZEPINE 300 MG PO TABS: 600.0000 mg | ORAL_TABLET | Freq: Every day | ORAL | Status: DC

## 2012-07-25 MED ORDER — ALBUTEROL SULFATE (5 MG/ML) 0.5% IN NEBU
2.5000 mg | INHALATION_SOLUTION | Freq: Four times a day (QID) | RESPIRATORY_TRACT | Status: DC
  Administered 2012-07-26 – 2012-07-31 (×21): 2.5 mg via RESPIRATORY_TRACT
  Filled 2012-07-25 (×19): qty 0.5

## 2012-07-25 MED ORDER — MORPHINE SULFATE 2 MG/ML IJ SOLN
2.0000 mg | INTRAMUSCULAR | Status: DC | PRN
  Administered 2012-07-26 – 2012-07-27 (×11): 4 mg via INTRAVENOUS
  Filled 2012-07-25 (×11): qty 2

## 2012-07-25 MED ORDER — ONDANSETRON HCL 4 MG PO TABS
4.0000 mg | ORAL_TABLET | Freq: Four times a day (QID) | ORAL | Status: DC | PRN
  Administered 2012-07-28: 4 mg via ORAL
  Filled 2012-07-25: qty 1

## 2012-07-25 MED ORDER — DM-GUAIFENESIN ER 30-600 MG PO TB12
1.0000 | ORAL_TABLET | Freq: Two times a day (BID) | ORAL | Status: DC | PRN
  Filled 2012-07-25 (×2): qty 1

## 2012-07-25 MED ORDER — IPRATROPIUM BROMIDE 0.02 % IN SOLN
0.5000 mg | Freq: Once | RESPIRATORY_TRACT | Status: AC
  Administered 2012-07-25: 0.5 mg via RESPIRATORY_TRACT
  Filled 2012-07-25 (×2): qty 2.5

## 2012-07-25 MED ORDER — ALBUTEROL SULFATE (5 MG/ML) 0.5% IN NEBU: 5.0000 mg | INHALATION_SOLUTION | RESPIRATORY_TRACT | Status: DC | PRN

## 2012-07-25 MED ORDER — ALBUTEROL (5 MG/ML) CONTINUOUS INHALATION SOLN
10.0000 mg/h | INHALATION_SOLUTION | Freq: Once | RESPIRATORY_TRACT | Status: AC
  Administered 2012-07-25: 10 mg/h via RESPIRATORY_TRACT
  Filled 2012-07-25 (×3): qty 20

## 2012-07-25 MED ORDER — OXYCODONE HCL 5 MG PO CAPS: 5.0000 mg | ORAL_CAPSULE | ORAL | Status: DC | PRN

## 2012-07-25 MED ORDER — GABAPENTIN 300 MG PO CAPS
600.0000 mg | ORAL_CAPSULE | Freq: Three times a day (TID) | ORAL | Status: DC
  Administered 2012-07-25 – 2012-07-26 (×4): 600 mg via ORAL
  Administered 2012-07-27: 300 mg via ORAL
  Administered 2012-07-27 (×2): 600 mg via ORAL
  Filled 2012-07-25 (×7): qty 2

## 2012-07-25 MED ORDER — DIAZEPAM 5 MG PO TABS: 2.5000 mg | ORAL_TABLET | Freq: Four times a day (QID) | ORAL | Status: DC | PRN

## 2012-07-25 MED ORDER — SODIUM CHLORIDE 0.9 % IV SOLN
INTRAVENOUS | Status: AC
  Administered 2012-07-25: 22:00:00 via INTRAVENOUS

## 2012-07-25 MED ORDER — OXYCODONE HCL 5 MG PO TABS
5.0000 mg | ORAL_TABLET | ORAL | Status: DC | PRN
  Administered 2012-07-27 – 2012-07-30 (×11): 10 mg via ORAL
  Administered 2012-07-30: 5 mg via ORAL
  Administered 2012-07-30 – 2012-07-31 (×2): 10 mg via ORAL
  Filled 2012-07-25 (×14): qty 2

## 2012-07-25 MED ORDER — BUDESONIDE-FORMOTEROL FUMARATE 80-4.5 MCG/ACT IN AERO
2.0000 | INHALATION_SPRAY | Freq: Two times a day (BID) | RESPIRATORY_TRACT | Status: DC
  Administered 2012-07-26 – 2012-07-31 (×8): 2 via RESPIRATORY_TRACT
  Filled 2012-07-25 (×2): qty 6.9

## 2012-07-25 MED ORDER — ESTRADIOL 2 MG PO TABS
2.0000 mg | ORAL_TABLET | Freq: Every day | ORAL | Status: DC
  Administered 2012-07-26 – 2012-07-31 (×6): 2 mg via ORAL
  Filled 2012-07-25 (×6): qty 1

## 2012-07-25 MED ORDER — SODIUM CHLORIDE 0.9 % IV SOLN
2.0000 g | INTRAVENOUS | Status: AC
  Administered 2012-07-25: 2 g via INTRAVENOUS
  Filled 2012-07-25: qty 20

## 2012-07-25 NOTE — ED Notes (Signed)
Pt desat to 88% on 2L, Placed on non-rebreather, O2-96%.

## 2012-07-25 NOTE — ED Notes (Signed)
Report called to unit 2600.

## 2012-07-25 NOTE — Progress Notes (Signed)
Notified [name], NP via text page that patient's EKG showed sinus tach HR 109. Patient is running HR 112 on tele now. Will continue to monitor patient. Hassan Buckler

## 2012-07-25 NOTE — H&P (Signed)
[facility] History and Physical  [name] [id number] DOB: 07-04-1965    PCP:   Erin Ewings, FNP   Chief Complaint: shortness of breath  HPI: [name] is an 47 y.o. female HIV positive on Atripla, asthma on steroid inhaler and as needed albuterol, DM2, HTN, depression and anxiety, presents to the ER with several days of coughs, shortness of breath and wheezing.   She has subjective fever and chills. Evaluation in the ER included a CXR with no infiltrate, but a CTPA showed patchy infiltrates, and no PE.  She has  a WBC of 17.8K with Hb of 10.9 g/DL, and normal renal fx tests, and Na 7f 126.  She was  given nebs, steroids, IV Rocephin and Zithromax, and hospitalist was asked to admit her as she still was having significant wheezing.  She also was noted to be hypoxemic.   Rewiew of Systems:  Constitutional: Negative for  fever and chills. No significant weight loss or weight gain Eyes: Negative for eye pain, redness and discharge, diplopia, visual changes, or flashes of light. ENMT: Negative for ear pain, hoarseness, nasal congestion, sinus pressure and sore throat. No headaches; tinnitus, drooling, or problem swallowing. Cardiovascular: Negative for chest pain, palpitations, diaphoresis, and peripheral edema. ; No orthopnea, PND Respiratory: Negative for cough, hemoptysis, and stridor. No pleuritic chestpain. Gastrointestinal: Negative for nausea, vomiting, diarrhea, constipation, abdominal pain, melena, blood in stool, hematemesis, jaundice and rectal bleeding.    Genitourinary: Negative for frequency, dysuria, incontinence,flank pain and hematuria; Musculoskeletal: Negative for back pain and neck pain. Negative for swelling and trauma.;  Skin: . Negative for pruritus, rash, abrasions, bruising and skin lesion.; ulcerations Neuro: Negative for headache, lightheadedness and neck stiffness. Negative for weakness, altered level of consciousness , altered mental  status, extremity weakness, burning feet, involuntary movement, seizure and syncope.  Psych: negative for anxiety, depression, insomnia, tearfulness, panic attacks, hallucinations, paranoia, suicidal or homicidal ideation    Past Medical History  Diagnosis Date  . Anxiety   . Depression   . Hypertension   . HIV infection 2000  . Asthma     HOSPITALIZED WITH EXCERBATION OF ASTHMA - AND BRONCHITIS AND THE FLU DEC 2013  . Shortness of breath     ALLERGIES ARE "ACTING UP"  . Diabetes mellitus     ON INSULIN AND ORAL MEDICATIONS  . GERD (gastroesophageal reflux disease)   . Neuromuscular disorder     NEUROPATHY HANDS AND FEET  . Arthritis     LEFT HIP  . Pain     SEVERE PAIN LEFT HIP - HX OF LEFT HIP PINNING JULY 2013    Past Surgical History  Procedure Laterality Date  . Hip pinning,cannulated  07/16/2011    Procedure: CANNULATED HIP PINNING;  Surgeon: Gearlean Alf, MD;  Location: [facility];  Service: Orthopedics;  Laterality: Left;  . Hip pinning,cannulated Left 04/09/2012    Procedure: CANNULATED HIP PINNING AND HARDWARE REVISION;  Surgeon: Gearlean Alf, MD;  Location: [facility];  Service: Orthopedics;  Laterality: Left;    Medications:  HOME MEDS: Prior to Admission medications   Medication Sig Start Date End Date Taking? Authorizing Provider  albuterol (PROVENTIL) (2.5 MG/3ML) 0.083% nebulizer solution Take 2.5 mg by nebulization every 6 (six) hours as needed for wheezing. For wheezing or shortness of breath 01/01/12  Yes Hosie Poisson, MD  atenolol (TENORMIN) 25 MG tablet Take 25 mg by mouth every morning.    Yes Historical Provider, MD  budesonide-formoterol (SYMBICORT) 80-4.5 MCG/ACT  inhaler Inhale 2 puffs into the lungs 2 (two) times daily. 01/01/12  Yes Hosie Poisson, MD  carbamazepine (TEGRETOL XR) 200 MG 12 hr tablet Take 600 mg by mouth 2 (two) times daily. 05/22/10  Yes Campbell Riches, MD  dextromethorphan-guaiFENesin [facility] DM) 30-600 MG per 12 hr tablet Take 1  tablet by mouth 2 (two) times daily as needed (congestion). 01/01/12  Yes Hosie Poisson, MD  diazepam (VALIUM) 5 MG tablet Take 0.5-1 tablets (2.5-5 mg total) by mouth at bedtime as needed for anxiety or sleep (7.5 MG DAILY  --TAKES 5MG  EVERY NIGHT AND 2.5MG  DURING DAY IF NEEDED). 04/22/12  Yes Campbell Riches, MD  efavirenz-emtricitabine-tenofovir (ATRIPLA) [id number] MG per tablet Take 1 tablet by mouth at bedtime. 05/02/12  Yes Campbell Riches, MD  estradiol (ESTRACE) 2 MG tablet Take 1 tablet (2 mg total) by mouth daily. 07/03/12  Yes Martha Clan, MD  fish oil-omega-3 fatty acids 1000 MG capsule Take 1 g by mouth 2 (two) times daily.   Yes Historical Provider, MD  fluconazole (DIFLUCAN) 100 MG tablet Take 100 mg by mouth daily.   Yes Historical Provider, MD  gabapentin (NEURONTIN) 300 MG capsule Take 600 mg by mouth 3 (three) times daily.    Yes Historical Provider, MD  gemfibrozil (LOPID) 600 MG tablet Take 600 mg by mouth 2 (two) times daily before a meal. 1/2 hour before meals 09/14/10  Yes Thayer Headings, MD  glipiZIDE (GLUCOTROL) 10 MG tablet Take 10 mg by mouth 2 (two) times daily before a meal.   Yes Historical Provider, MD  hydrochlorothiazide (HYDRODIURIL) 25 MG tablet Take 25 mg by mouth every morning.    Yes Historical Provider, MD  insulin aspart (NOVOLOG) 100 UNIT/ML injection Inject 25 Units into the skin 3 (three) times daily before meals. Pt takes 15 units at each meals but sometimes may need more.   Yes Historical Provider, MD  insulin glargine (LANTUS) 100 UNIT/ML injection Inject 90 Units into the skin at bedtime.  06/09/10  Yes Campbell Riches, MD  metFORMIN (GLUCOPHAGE) 1000 MG tablet Take 1,000 mg by mouth 2 (two) times daily with a meal.   Yes Historical Provider, MD  methocarbamol (ROBAXIN) 500 MG tablet Take 1 tablet (500 mg total) by mouth every 6 (six) hours as needed. Every 6-8 hours as needed for muscle spasms 04/10/12  Yes Alexzandrew Dara Lords, PA-C  mometasone (NASONEX)  50 MCG/ACT nasal spray Place 2 sprays into the nose daily.   Yes Historical Provider, MD  Multiple Vitamins-Minerals (MULTIVITAMIN WITH MINERALS) tablet Take 1 tablet by mouth daily.   Yes Historical Provider, MD  oxcarbazepine (TRILEPTAL) 600 MG tablet Take 600 mg by mouth daily.   Yes Historical Provider, MD  oxycodone (OXY-IR) 5 MG capsule Take 5-10 mg by mouth every 3 (three) hours as needed for pain.   Yes Historical Provider, MD  pantoprazole (PROTONIX) 40 MG tablet Take 40 mg by mouth every morning.  07/13/10  Yes Lyndee Hensen, NP  risperidone (RISPERDAL) 4 MG tablet Take 4 mg by mouth daily.   Yes Historical Provider, MD  traMADol (ULTRAM) 50 MG tablet Take 50 mg by mouth every 6 (six) hours as needed for pain.   Yes Historical Provider, MD  vitamin E (VITAMIN E) 1000 UNIT capsule Take 1,000 Units by mouth daily.    Yes Historical Provider, MD  diphenoxylate-atropine (LOMOTIL) 2.5-0.025 MG per tablet Take 1 tablet by mouth 4 (four) times daily as needed for diarrhea or loose  stools. 04/22/12   Campbell Riches, MD     Allergies:  No Known Allergies  Social History:   reports that she has been smoking Cigarettes.  She has a 26.25 pack-year smoking history. She has never used smokeless tobacco. She reports that she does not drink alcohol or use illicit drugs.  Family History: Family History  Problem Relation Age of Onset  . Diabetes Mother   . Hypertension Mother   . Vision loss Mother   . Hypertension Father      Physical Exam: Filed Vitals:   07/25/12 1715 07/25/12 1816 07/25/12 1818 07/25/12 2001  BP: 97/70 130/65 130/65 105/56  Pulse: 102 107 109 111  Temp:   98 F (36.7 C)   TempSrc:   Oral   Resp: 24 18 21    SpO2: 91% 89% 91% 90%   Blood pressure 105/56, pulse 111, temperature 98 F (36.7 C), temperature source Oral, resp. rate 21, last menstrual period 11/16/2010, SpO2 90.00%.  GEN:  Pleasant  patient lying in the stretcher in no acute distress;  cooperative with exam. PSYCH:  alert and oriented x4; does not appear anxious or depressed; affect is appropriate. HEENT: Mucous membranes pink and anicteric; PERRLA; EOM intact; no cervical lymphadenopathy nor thyromegaly or carotid bruit; no JVD; There were no stridor. Neck is very supple. Breasts:: Not examined CHEST WALL: No tenderness CHEST: Normal respiration, tightly wheezing bilaterally.   HEART: Regular rhythm but tachycardic.  There are no murmur, rub, or gallops.   BACK: No kyphosis or scoliosis; no CVA tenderness ABDOMEN: soft and non-tender; no masses, no organomegaly, normal abdominal bowel sounds; no pannus; no intertriginous candida. There is no rebound and no distention. Rectal Exam: Not done EXTREMITIES: No bone or joint deformity; age-appropriate arthropathy of the hands and knees; no edema; no ulcerations.  There is no calf tenderness. Genitalia: not examined PULSES: 2+ and symmetric SKIN: Normal hydration no rash or ulceration CNS: Cranial nerves 2-12 grossly intact no focal lateralizing neurologic deficit.  Speech is fluent; uvula elevated with phonation, facial symmetry and tongue midline. DTR are normal bilaterally, cerebella exam is intact, barbinski is negative and strengths are equaled bilaterally.  No sensory loss.   Labs on Admission:  Basic Metabolic Panel:  Recent Labs Lab 07/25/12 1538 07/25/12 1542  NA 124* 126*  K 4.7 4.7  CL 85* 94*  CO2 24  --   GLUCOSE 191* 192*  BUN 13 15  CREATININE 0.85 0.90  CALCIUM 9.2  --    Liver Function Tests: No results found for this basename: AST, ALT, ALKPHOS, BILITOT, PROT, ALBUMIN,  in the last 168 hours No results found for this basename: LIPASE, AMYLASE,  in the last 168 hours No results found for this basename: AMMONIA,  in the last 168 hours CBC:  Recent Labs Lab 07/25/12 1538 07/25/12 1542  WBC 17.8*  --   NEUTROABS 16.4*  --   HGB 10.9* 12.6  HCT 33.2* 37.0  MCV 84.9  --   PLT 387  --     Cardiac Enzymes:  Recent Labs Lab 07/25/12 1538  TROPONINI <0.30    CBG: No results found for this basename: GLUCAP,  in the last 168 hours   Radiological Exams on Admission: Ct Angio Chest Pe W/cm &/or Wo Cm  07/25/2012   *RADIOLOGY REPORT*  Clinical Data: Increasing chest pain and shortness of breath for 3 days.  CT ANGIOGRAPHY CHEST  Technique:  Multidetector CT imaging of the chest using the standard protocol  during bolus administration of intravenous contrast. Multiplanar reconstructed images including MIPs were obtained and reviewed to evaluate the vascular anatomy.  Contrast: 169mL OMNIPAQUE IOHEXOL 350 MG/ML SOLN  Comparison: Chest x-ray dated 07/25/2012  Findings: There are no pulmonary emboli.  There are multiple small patchy areas of infiltrate bilaterally.  No effusions.  Heart size is at the upper limits of normal.  Pulmonary vascularity is normal. There are a few small lymph nodes in the mediastinum.  No acute osseous abnormality.  Accentuation of the thoracic kyphosis.  IMPRESSION:  1.  No pulmonary emboli. 2.  Multiple small patchy areas of infiltrate in both lungs.  The possibility of opportunistic infection should be considered in this patient with HIV disease.   Original Report Authenticated By: Lorriane Shire, M.D.   Dg Chest Port 1 View  07/25/2012   *RADIOLOGY REPORT*  Clinical Data: Shortness of breath with chest pain and cough for 3 days.  Asthma.  Smoker.  PORTABLE CHEST - 1 VIEW  Comparison: 01/01/2012  Findings: Mildly degraded exam due to AP portable technique and patient body habitus.  There are also multiple leads and wires projecting over the chest. Mild convex right thoracic spine curvature.  Heart size upper normal, accentuated by AP portable technique. No pleural effusion or pneumothorax.  No congestive failure.  Mildly low lung volumes. Clear lungs.  IMPRESSION: Borderline cardiomegaly, without acute disease.   Original Report Authenticated By: Abigail Miyamoto, M.D.     Assessment/Plan Present on Admission:  . CAP (community acquired pneumonia) . HIV DISEASE . DIABETES MELLITUS, TYPE II . Hyponatremia . HYPERTENSION . HYPERLIPIDEMIA . DEPRESSION . Tobacco use disorder  PLAN:  Will admit her for CAP, but I am also suspicious for PCP.  Will obtain DFA for Pneumocystis J.  And will add Bactrim IV along with IV Steroids onto her Levaquin .  She will need frequent nebulizer tx.  I have continued her home HIV Meds.  For her DM, will use SSI resistant scale.  She is stable, full code, and will be admitted to [facility] service telemetry.  Other plans as per orders.  Code Status: FULL Haskel Khan, MD. [facility] Pager [phone] 7pm to 7am.  07/25/2012, 8:57 PM

## 2012-07-25 NOTE — ED Provider Notes (Addendum)
CSN: [id number]     Arrival date & time 07/25/12  1418 History     First MD Initiated Contact with Patient 07/25/12 1507     Chief Complaint  Patient presents with  . Shortness of Breath  . Chest Pain   (Consider location/radiation/quality/duration/timing/severity/associated sxs/prior Treatment) HPI Comments: Pt with hx of DM, HTN, Asthma, HIV - last CD4 in April was 560 comes in with cc of dib. Pt has been having some dib for the past few days, mostly exertional, acute ly worse over the past 2 days. She has been taking asthma meds, with no relief. Pt has a non productive cough, and a pleuritic type chest pain. Pt has no n/v/f/c. Pt denies any hx of DVT, PE, but does have hx of hip replacement in April.  Patient is a 47 y.o. female presenting with shortness of breath and chest pain. The history is provided by the patient and medical records.  Shortness of Breath Associated symptoms: chest pain, cough and wheezing   Associated symptoms: no abdominal pain, no fever, no neck pain and no vomiting   Chest Pain Associated symptoms: cough, dizziness, shortness of breath and weakness   Associated symptoms: no abdominal pain, no fever, no nausea and not vomiting     Past Medical History  Diagnosis Date  . Anxiety   . Depression   . Hypertension   . HIV infection 2000  . Asthma     HOSPITALIZED WITH EXCERBATION OF ASTHMA - AND BRONCHITIS AND THE FLU DEC 2013  . Shortness of breath     ALLERGIES ARE "ACTING UP"  . Diabetes mellitus     ON INSULIN AND ORAL MEDICATIONS  . GERD (gastroesophageal reflux disease)   . Neuromuscular disorder     NEUROPATHY HANDS AND FEET  . Arthritis     LEFT HIP  . Pain     SEVERE PAIN LEFT HIP - HX OF LEFT HIP PINNING JULY 2013   Past Surgical History  Procedure Laterality Date  . Hip pinning,cannulated  07/16/2011    Procedure: CANNULATED HIP PINNING;  Surgeon: Gearlean Alf, MD;  Location: [facility];  Service: Orthopedics;  Laterality: Left;  . Hip  pinning,cannulated Left 04/09/2012    Procedure: CANNULATED HIP PINNING AND HARDWARE REVISION;  Surgeon: Gearlean Alf, MD;  Location: [facility];  Service: Orthopedics;  Laterality: Left;   Family History  Problem Relation Age of Onset  . Diabetes Mother   . Hypertension Mother   . Vision loss Mother   . Hypertension Father    History  Substance Use Topics  . Smoking status: Current Every Day Smoker -- 0.75 packs/day for 35 years    Types: Cigarettes  . Smokeless tobacco: Never Used     Comment: been trying to quit for the last month  . Alcohol Use: No     Comment: RECOVERING ALCOHOLIC - NO ALCOHOL FOR PAST 6 YRS   OB History   Grav Para Term Preterm Abortions TAB SAB Ect Mult Living   2 1 1  0 1  1 0 0 1     Review of Systems  Constitutional: Negative for fever and activity change.  HENT: Negative for facial swelling and neck pain.   Respiratory: Positive for cough, shortness of breath and wheezing.   Cardiovascular: Positive for chest pain. Negative for leg swelling.  Gastrointestinal: Negative for nausea, vomiting, abdominal pain, diarrhea, constipation, blood in stool and abdominal distention.  Genitourinary: Negative for hematuria and difficulty urinating.  Skin:  Negative for color change.  Neurological: Positive for dizziness and weakness. Negative for speech difficulty.  Hematological: Does not bruise/bleed easily.  Psychiatric/Behavioral: Negative for confusion.    Allergies  Review of patient's allergies indicates no known allergies.  Home Medications   Current Outpatient Rx  Name  Route  Sig  Dispense  Refill  . albuterol (PROVENTIL) (2.5 MG/3ML) 0.083% nebulizer solution   Nebulization   Take 2.5 mg by nebulization every 6 (six) hours as needed for wheezing. For wheezing or shortness of breath         . atenolol (TENORMIN) 25 MG tablet   Oral   Take 25 mg by mouth every morning.          . budesonide-formoterol (SYMBICORT) 80-4.5 MCG/ACT inhaler    Inhalation   Inhale 2 puffs into the lungs 2 (two) times daily.         . carbamazepine (TEGRETOL XR) 200 MG 12 hr tablet   Oral   Take 600 mg by mouth 2 (two) times daily.         [name] Kitchen dextromethorphan-guaiFENesin (MUCINEX DM) 30-600 MG per 12 hr tablet   Oral   Take 1 tablet by mouth 2 (two) times daily as needed (congestion).         . diazepam (VALIUM) 5 MG tablet   Oral   Take 0.5-1 tablets (2.5-5 mg total) by mouth at bedtime as needed for anxiety or sleep (7.5 MG DAILY  --TAKES 5MG  EVERY NIGHT AND 2.5MG  DURING DAY IF NEEDED).   30 tablet   0   . efavirenz-emtricitabine-tenofovir (ATRIPLA) 600-200-300 MG per tablet   Oral   Take 1 tablet by mouth at bedtime.   30 tablet   3   . estradiol (ESTRACE) 2 MG tablet   Oral   Take 1 tablet (2 mg total) by mouth daily.   30 tablet   2   . fish oil-omega-3 fatty acids 1000 MG capsule   Oral   Take 1 g by mouth 2 (two) times daily.         . fluconazole (DIFLUCAN) 100 MG tablet   Oral   Take 100 mg by mouth daily.         [name] Kitchen gabapentin (NEURONTIN) 300 MG capsule   Oral   Take 600 mg by mouth 3 (three) times daily.          [name] Kitchen gemfibrozil (LOPID) 600 MG tablet   Oral   Take 600 mg by mouth 2 (two) times daily before a meal. 1/2 hour before meals         . glipiZIDE (GLUCOTROL) 10 MG tablet   Oral   Take 10 mg by mouth 2 (two) times daily before a meal.         . hydrochlorothiazide (HYDRODIURIL) 25 MG tablet   Oral   Take 25 mg by mouth every morning.          . insulin aspart (NOVOLOG) 100 UNIT/ML injection   Subcutaneous   Inject 25 Units into the skin 3 (three) times daily before meals. Pt takes 15 units at each meals but sometimes may need more.         . insulin glargine (LANTUS) 100 UNIT/ML injection   Subcutaneous   Inject 90 Units into the skin at bedtime.          . metFORMIN (GLUCOPHAGE) 1000 MG tablet   Oral   Take 1,000 mg by mouth 2 (two) times daily with a  meal.         .  methocarbamol (ROBAXIN) 500 MG tablet   Oral   Take 1 tablet (500 mg total) by mouth every 6 (six) hours as needed. Every 6-8 hours as needed for muscle spasms   80 tablet   0   . mometasone (NASONEX) 50 MCG/ACT nasal spray   Nasal   Place 2 sprays into the nose daily.         . Multiple Vitamins-Minerals (MULTIVITAMIN WITH MINERALS) tablet   Oral   Take 1 tablet by mouth daily.         [name] Kitchen oxcarbazepine (TRILEPTAL) 600 MG tablet   Oral   Take 600 mg by mouth daily.         [name] Kitchen oxycodone (OXY-IR) 5 MG capsule   Oral   Take 5-10 mg by mouth every 3 (three) hours as needed for pain.         . pantoprazole (PROTONIX) 40 MG tablet   Oral   Take 40 mg by mouth every morning.          . risperidone (RISPERDAL) 4 MG tablet   Oral   Take 4 mg by mouth daily.         . traMADol (ULTRAM) 50 MG tablet   Oral   Take 50 mg by mouth every 6 (six) hours as needed for pain.         . vitamin E (VITAMIN E) 1000 UNIT capsule   Oral   Take 1,000 Units by mouth daily.          . diphenoxylate-atropine (LOMOTIL) 2.5-0.025 MG per tablet   Oral   Take 1 tablet by mouth 4 (four) times daily as needed for diarrhea or loose stools.   30 tablet   0    BP 92/44  Pulse 98  Temp(Src) 98.5 F (36.9 C) (Oral)  Resp 21  SpO2 90%  LMP 11/16/2010 Physical Exam  Nursing note and vitals reviewed. Constitutional: She is oriented to person, place, and time. She appears well-developed and well-nourished.  HENT:  Head: Normocephalic and atraumatic.  Eyes: EOM are normal. Pupils are equal, round, and reactive to light.  Neck: Neck supple.  Cardiovascular: Normal rate, regular rhythm and normal heart sounds.   No murmur heard. Pulmonary/Chest: She is in respiratory distress. She has wheezes.  Pt tachypnea, hypoxic at 89% on 6 L O2, mild resp distress with abd retractions, diffuse rhonchus breath sounds with inspiration and expiration.  Abdominal: Soft. She exhibits no distension.  There is no tenderness. There is no rebound and no guarding.  Neurological: She is alert and oriented to person, place, and time.  Skin: Skin is warm and dry.    ED Course   Procedures (including critical care time)  Labs Reviewed  CBC WITH DIFFERENTIAL - Abnormal; Notable for the following:    WBC 17.8 (*)    Hemoglobin 10.9 (*)    HCT 33.2 (*)    RDW 17.5 (*)    Neutrophils Relative % 92 (*)    Neutro Abs 16.4 (*)    Lymphocytes Relative 3 (*)    Lymphs Abs 0.6 (*)    All other components within normal limits  POCT I-STAT, CHEM 8 - Abnormal; Notable for the following:    Sodium 126 (*)    Chloride 94 (*)    Glucose, Bld 192 (*)    Calcium, Ion 1.08 (*)    All other components within normal limits  URINALYSIS,  ROUTINE W REFLEX MICROSCOPIC  BASIC METABOLIC PANEL  PRO B NATRIURETIC PEPTIDE  TROPONIN I   Dg Chest Port 1 View  07/25/2012   *RADIOLOGY REPORT*  Clinical Data: Shortness of breath with chest pain and cough for 3 days.  Asthma.  Smoker.  PORTABLE CHEST - 1 VIEW  Comparison: 01/01/2012  Findings: Mildly degraded exam due to AP portable technique and patient body habitus.  There are also multiple leads and wires projecting over the chest. Mild convex right thoracic spine curvature.  Heart size upper normal, accentuated by AP portable technique. No pleural effusion or pneumothorax.  No congestive failure.  Mildly low lung volumes. Clear lungs.  IMPRESSION: Borderline cardiomegaly, without acute disease.   Original Report Authenticated By: Abigail Miyamoto, M.D.   No diagnosis found.  MDM  Differential diagnosis includes: ACS syndrome COPD exacerbation CHF exacerbation Valvular disorder Myocarditis Pericarditis Pericardial effusion Pneumonia Pleural effusion Pulmonary edema PE Anemia Musculoskeletal pain  Pt comes in with cc of dyspnea. Pt noted to be in hypoxic respiratory failure. She has hx of recent surgery, pleurutuc type chest pain, and is tachycardic,  tachyoneic. WELLS score for PE is 6. Pt also has HIV, last CD4>500, We will get CXR to start, if no multifocal PNA, pt will get CT PE.  If all results negative, then, she will likely need admission for asthma, copd exacerbation. We have started her on albuterol continuous nebs.   Varney Biles, MD 07/25/12 1558   Date: 07/25/2012  Rate: 96  Rhythm: normal sinus rhythm  QRS Axis: normal  Intervals: normal  ST/T Wave abnormalities: normal  Conduction Disutrbances: none  Narrative Interpretation: unremarkable   Varney Biles, MD 07/25/12 1651  6:54 PM No PE, possible infection. Will give CAP tx. Admit.  Varney Biles, MD 07/25/12 1854   CRITICAL CARE Performed by: Varney Biles   Total critical care time: 30 minutes Pneumonia with respiratory failure, severe sepsis  Critical care time was exclusive of separately billable procedures and treating other patients.  Critical care was necessary to treat or prevent imminent or life-threatening deterioration.  Critical care was time spent personally by me on the following activities: development of treatment plan with patient and/or surrogate as well as nursing, discussions with consultants, evaluation of patient's response to treatment, examination of patient, obtaining history from patient or surrogate, ordering and performing treatments and interventions, ordering and review of laboratory studies, ordering and review of radiographic studies, pulse oximetry and re-evaluation of patient's condition.   Varney Biles, MD 07/25/12 [phone]

## 2012-07-25 NOTE — ED Notes (Signed)
Pt to ED via EMS with c/o chest pain with cough, onset 2 days and SOB, onset yesterday. Pt used albuterol at home with no relief. Per EMS, per is wheezing bilaterally, BP-134/84, HR-100, O2-90% on room air. EMS given 10mg  albuterol, 1mg  of atrovent, 25 solumedrol, 325mg  ASA, 1 nitro.

## 2012-07-25 NOTE — Consult Note (Signed)
ANTIBIOTIC CONSULT NOTE - INITIAL  Pharmacy Consult for Bactrim Indication: possible PCP pneumonia  No Known Allergies  Patient Measurements: Height: 5' 10.5" (179.1 cm) Weight: 266 lb 15.6 oz (121.1 kg) IBW/kg (Calculated) : 69.65  Vital Signs: Temp: 97.4 F (36.3 C) (07/25 2100) Temp src: Oral (07/25 2100) BP: 160/95 mmHg (07/25 2100) Pulse Rate: 124 (07/25 2100) Intake/Output from previous day:   Intake/Output from this shift:    Labs:  Recent Labs  07/25/12 1538 07/25/12 1542  WBC 17.8*  --   HGB 10.9* 12.6  PLT 387  --   CREATININE 0.85 0.90   Estimated Creatinine Clearance: 109 ml/min (by C-G formula based on Cr of 0.9).  Microbiology: No results found for this or any previous visit (from the past 720 hour(s)).  Medical History: Past Medical History  Diagnosis Date  . Anxiety   . Depression   . Hypertension   . HIV infection 2000  . Asthma     HOSPITALIZED WITH EXCERBATION OF ASTHMA - AND BRONCHITIS AND THE FLU DEC 2013  . Shortness of breath     ALLERGIES ARE "ACTING UP"  . Diabetes mellitus     ON INSULIN AND ORAL MEDICATIONS  . GERD (gastroesophageal reflux disease)   . Neuromuscular disorder     NEUROPATHY HANDS AND FEET  . Arthritis     LEFT HIP  . Pain     SEVERE PAIN LEFT HIP - HX OF LEFT HIP PINNING JULY 2013   Assessment: 47yof with hx HIV presents to the ED with respiratory distress. CT chest negative for PE but shows infiltrate in both lungs which could be an opportunistic infection. She will begin bactrim for possible PCP pneumonia. Renal function is stable. Weight = 121.1kg.  Goal of Therapy:  Appropriate bactrim dosing  Plan:  1) Bactrim 5mg /kg IV q8 2) Follow renal function, cultures, LOT  Deboraha Sprang 07/25/2012,10:13 PM

## 2012-07-26 ENCOUNTER — Encounter (HOSPITAL_COMMUNITY): Payer: Self-pay | Admitting: *Deleted

## 2012-07-26 DIAGNOSIS — J209 Acute bronchitis, unspecified: Secondary | ICD-10-CM

## 2012-07-26 DIAGNOSIS — E119 Type 2 diabetes mellitus without complications: Secondary | ICD-10-CM

## 2012-07-26 LAB — GLUCOSE, CAPILLARY
Glucose-Capillary: 212 mg/dL — ABNORMAL HIGH (ref 70–99)
Glucose-Capillary: 172 mg/dL — ABNORMAL HIGH (ref 70–99)
Glucose-Capillary: 238 mg/dL — ABNORMAL HIGH (ref 70–99)

## 2012-07-26 MED ORDER — ALUM & MAG HYDROXIDE-SIMETH 200-200-20 MG/5ML PO SUSP
30.0000 mL | Freq: Once | ORAL | Status: AC
  Administered 2012-07-27: 30 mL via ORAL
  Filled 2012-07-26: qty 30

## 2012-07-26 MED ORDER — BIOTENE DRY MOUTH MT LIQD
15.0000 mL | Freq: Two times a day (BID) | OROMUCOSAL | Status: DC
  Administered 2012-07-26 – 2012-07-31 (×10): 15 mL via OROMUCOSAL

## 2012-07-26 NOTE — Progress Notes (Signed)
Patient admitted to 5w33 from ED. Patient is A&Ox3. Patient lives at home with mother. Patient's bilateral extremities are swollen. Patient placed on tele. Patient's feet are cold, pt states that they are always cold. Explained to call for assistance before getting up.Patient on 4L of O2. Placed BSC beside bed. Will continue to monitor patient. Ranelle Oyster, RN

## 2012-07-26 NOTE — Progress Notes (Signed)
[facility] PROGRESS NOTE  [name] [id number] DOB: 10/10/1965 DOA: 07/25/2012 PCP: Elbert Ewings, FNP   HPI/Subjective: Still has significant wheezing and, cough and shortness of breath.  Assessment/Plan:  CAP -Patchy infiltrates on the CT scan consistent with pneumonia. -Although infiltrates are atypical, last CD4 count is 560 she has very low risk to develop PCP. -Patient is on IV Bactrim, will discontinue. Discussed with Dr. Megan Salon of ID service. -Continue management for community-acquired pneumonia with Rocephin and azithromycin. -Supportive management with antitussives, mucolytics and oxygen as needed. -Patient is chronic tobacco user, cannot rule out COPD.  HIV disease -Seems to be controlled, continue ART medications. -CD4 count in April was 560.  Hypertension -Continue preadmission oral medications.  Diabetes mellitus type 2 -Continue carbohydrate modified diet, check hemoglobin A1c. -Expect high CBGs with steroids on board.  Code Status: Full code Family Communication: Plan discussed with patient Disposition Plan: Remain inpatient   Consultants:  None  Procedures:  None  Antibiotics:  Azithromycin and Rocephin.  Bactrim >> DC on 7/26   Objective: Filed Vitals:   07/26/12 0209 07/26/12 0610 07/26/12 0653 07/26/12 0834  BP:  134/71    Pulse: 109 105    Temp:   98 F (36.7 C)   TempSrc:   Axillary   Resp:  24    Height:      Weight:      SpO2: 93% 91%  91%    Intake/Output Summary (Last 24 hours) at 07/26/12 1417 Last data filed at 07/26/12 0934  Gross per 24 hour  Intake 2893.1 ml  Output      0 ml  Net 2893.1 ml   Filed Weights   07/25/12 2100  Weight: 121.1 kg (266 lb 15.6 oz)    Exam: General: Alert and awake, oriented x3, not in any acute distress. HEENT: anicteric sclera, pupils reactive to light and accommodation, EOMI CVS: S1-S2 clear, no murmur rubs or gallops Chest: Significant bilateral  wheezing Abdomen: soft nontender, nondistended, normal bowel sounds, no organomegaly Extremities: no cyanosis, clubbing or edema noted bilaterally Neuro: Cranial nerves II-XII intact, no focal neurological deficits   Data Reviewed: Basic Metabolic Panel:  Recent Labs Lab 07/25/12 1538 07/25/12 1542  NA 124* 126*  K 4.7 4.7  CL 85* 94*  CO2 24  --   GLUCOSE 191* 192*  BUN 13 15  CREATININE 0.85 0.90  CALCIUM 9.2  --    Liver Function Tests: No results found for this basename: AST, ALT, ALKPHOS, BILITOT, PROT, ALBUMIN,  in the last 168 hours No results found for this basename: LIPASE, AMYLASE,  in the last 168 hours No results found for this basename: AMMONIA,  in the last 168 hours CBC:  Recent Labs Lab 07/25/12 1538 07/25/12 1542  WBC 17.8*  --   NEUTROABS 16.4*  --   HGB 10.9* 12.6  HCT 33.2* 37.0  MCV 84.9  --   PLT 387  --    Cardiac Enzymes:  Recent Labs Lab 07/25/12 1538  TROPONINI <0.30   BNP (last 3 results)  Recent Labs  07/25/12 1538  PROBNP 68.4   CBG:  Recent Labs Lab 07/25/12 2248 07/26/12 0811 07/26/12 1145  GLUCAP 343* 284* 212*    No results found for this or any previous visit (from the past 240 hour(s)).   Studies: Ct Angio Chest Pe W/cm &/or Wo Cm  07/25/2012   *RADIOLOGY REPORT*  Clinical Data: Increasing chest pain and shortness of breath for 3 days.  CT ANGIOGRAPHY  CHEST  Technique:  Multidetector CT imaging of the chest using the standard protocol during bolus administration of intravenous contrast. Multiplanar reconstructed images including MIPs were obtained and reviewed to evaluate the vascular anatomy.  Contrast: 15mL OMNIPAQUE IOHEXOL 350 MG/ML SOLN  Comparison: Chest x-ray dated 07/25/2012  Findings: There are no pulmonary emboli.  There are multiple small patchy areas of infiltrate bilaterally.  No effusions.  Heart size is at the upper limits of normal.  Pulmonary vascularity is normal. There are a few small lymph nodes  in the mediastinum.  No acute osseous abnormality.  Accentuation of the thoracic kyphosis.  IMPRESSION:  1.  No pulmonary emboli. 2.  Multiple small patchy areas of infiltrate in both lungs.  The possibility of opportunistic infection should be considered in this patient with HIV disease.   Original Report Authenticated By: Lorriane Shire, M.D.   Dg Chest Port 1 View  07/25/2012   *RADIOLOGY REPORT*  Clinical Data: Shortness of breath with chest pain and cough for 3 days.  Asthma.  Smoker.  PORTABLE CHEST - 1 VIEW  Comparison: 01/01/2012  Findings: Mildly degraded exam due to AP portable technique and patient body habitus.  There are also multiple leads and wires projecting over the chest. Mild convex right thoracic spine curvature.  Heart size upper normal, accentuated by AP portable technique. No pleural effusion or pneumothorax.  No congestive failure.  Mildly low lung volumes. Clear lungs.  IMPRESSION: Borderline cardiomegaly, without acute disease.   Original Report Authenticated By: Abigail Miyamoto, M.D.    Scheduled Meds: . albuterol  2.5 mg Nebulization Q6H  . antiseptic oral rinse  15 mL Mouth Rinse BID  . atenolol  25 mg Oral q morning - 10a  . budesonide-formoterol  2 puff Inhalation BID  . carbamazepine  600 mg Oral BID  . docusate sodium  100 mg Oral BID  . efavirenz-emtricitabine-tenofovir  1 tablet Oral QHS  . estradiol  2 mg Oral Daily  . fluconazole  100 mg Oral Daily  . fluticasone  1 spray Each Nare Daily  . gabapentin  600 mg Oral TID  . gemfibrozil  600 mg Oral BID AC  . glipiZIDE  10 mg Oral BID AC  . heparin  5,000 Units Subcutaneous Q8H  . hydrochlorothiazide  25 mg Oral q morning - 10a  . insulin aspart  0-20 Units Subcutaneous TID WC  . insulin aspart  0-5 Units Subcutaneous QHS  . insulin aspart  25 Units Subcutaneous TID AC  . insulin glargine  90 Units Subcutaneous QHS  . levofloxacin (LEVAQUIN) IV  750 mg Intravenous Q24H  . methylPREDNISolone (SOLU-MEDROL)  injection  40 mg Intravenous Q6H  . omega-3 acid ethyl esters  1 g Oral BID  . OXcarbazepine  600 mg Oral Daily  . pantoprazole  40 mg Oral q morning - 10a  . risperidone  4 mg Oral QHS  . sulfamethoxazole-trimethoprim  15 mg/kg/day Intravenous Q8H   Continuous Infusions:   Active Problems:   HIV DISEASE   DIABETES MELLITUS, TYPE II   HYPERLIPIDEMIA   DEPRESSION   HYPERTENSION   Tobacco use disorder   Hyponatremia   CAP (community acquired pneumonia)    Time spent: 67 minutes    [facility] Pager [phone]. If 7PM-7AM, please contact night-coverage at [email or web address], password [facility] 07/26/2012, 2:17 PM  LOS: 1 day

## 2012-07-26 NOTE — Progress Notes (Signed)
Notified [name], NP that patient's T wave's are elevated on telemetry and monitoring system is counting t-waves as extra heart beat. Notified April, rapid response RN of situation. April, rapid response came to floor to assess patient. [name] gave order for another EKG. Patient states that she needs diludad for pain due to morphine not helping back pain. [name], Np gave order to increase morphine. Will continue to monitor patient. Ranelle Oyster, RN

## 2012-07-26 NOTE — Progress Notes (Signed)
Notified [name], NP that patient's EKG is completed and patient's T waves are peaked in v3, v4, v5. April, rapid response RN looked at EKG also. [name], NP stated that patient's T waves are slightly elevated on EKG. No new orders given. Will continue to monitor patient. Ranelle Oyster, RN

## 2012-07-26 NOTE — Progress Notes (Signed)
Notifed [name], NP and [name] rapid response RN that patient's heart rate is in the 140-150's sustained. [name] gave order to get EKG and call with results. Will continue to monitor patient. [name]. RN

## 2012-07-26 NOTE — Progress Notes (Signed)
Pt's cgb is treading in the low range.Spoke to MD- holding insulin and glucotrol- Will continue to monitor

## 2012-07-26 NOTE — Progress Notes (Signed)
Hypoglycemic Event   CBG: 69  Treatment: 15 GM carbohydrate snack  Symptoms: Pale and None    Follow-up CBG: Time:5:20 CBG Result:67  Possible Reasons for Event: Inadequate meal intake  Comments/MD notified:YES. [name] M  Remember to initiate Hypoglycemia Order Set & complete

## 2012-07-26 NOTE — Progress Notes (Signed)
Hypoglycemic Event  CBG: 67  Treatment: 15 GM carbohydrate snack  Symptoms: None  Follow-up CBG: Time:0551 CBG Result:172  Possible Reasons for Event: Inadequate meal intake  Comments/MD notified:meal had not arrived. Just arriving. Pt ate full meal    [name]  Remember to initiate Hypoglycemia Order Set & complete

## 2012-07-27 DIAGNOSIS — B20 Human immunodeficiency virus [HIV] disease: Secondary | ICD-10-CM

## 2012-07-27 DIAGNOSIS — E871 Hypo-osmolality and hyponatremia: Secondary | ICD-10-CM

## 2012-07-27 LAB — BASIC METABOLIC PANEL
CO2: 25 mEq/L (ref 19–32)
Chloride: 83 mEq/L — ABNORMAL LOW (ref 96–112)
Glucose, Bld: 302 mg/dL — ABNORMAL HIGH (ref 70–99)
Sodium: 121 mEq/L — ABNORMAL LOW (ref 135–145)

## 2012-07-27 LAB — CBC
HCT: 31.8 % — ABNORMAL LOW (ref 36.0–46.0)
MCH: 27.7 pg (ref 26.0–34.0)
MCV: 85.5 fL (ref 78.0–100.0)
RBC: 3.72 MIL/uL — ABNORMAL LOW (ref 3.87–5.11)
WBC: 19.2 10*3/uL — ABNORMAL HIGH (ref 4.0–10.5)

## 2012-07-27 LAB — GLUCOSE, CAPILLARY

## 2012-07-27 MED ORDER — HYDROMORPHONE HCL PF 1 MG/ML IJ SOLN
0.5000 mg | INTRAMUSCULAR | Status: DC | PRN
  Administered 2012-07-27 – 2012-07-29 (×13): 1 mg via INTRAVENOUS
  Filled 2012-07-27 (×13): qty 1

## 2012-07-27 MED ORDER — GABAPENTIN 300 MG PO CAPS
300.0000 mg | ORAL_CAPSULE | Freq: Three times a day (TID) | ORAL | Status: DC
  Administered 2012-07-28 – 2012-07-31 (×10): 300 mg via ORAL
  Filled 2012-07-27 (×13): qty 1

## 2012-07-27 MED ORDER — FUROSEMIDE 10 MG/ML IJ SOLN
40.0000 mg | Freq: Once | INTRAMUSCULAR | Status: AC
  Administered 2012-07-27: 40 mg via INTRAVENOUS
  Filled 2012-07-27: qty 4

## 2012-07-27 MED ORDER — GUAIFENESIN ER 600 MG PO TB12
1200.0000 mg | ORAL_TABLET | Freq: Two times a day (BID) | ORAL | Status: DC
  Administered 2012-07-27 – 2012-07-31 (×9): 1200 mg via ORAL
  Filled 2012-07-27 (×11): qty 2

## 2012-07-27 MED ORDER — HYDROMORPHONE HCL PF 1 MG/ML IJ SOLN
0.5000 mg | Freq: Once | INTRAMUSCULAR | Status: AC
  Administered 2012-07-27: 0.5 mg via INTRAVENOUS
  Filled 2012-07-27: qty 1

## 2012-07-27 NOTE — Progress Notes (Addendum)
[facility] PROGRESS NOTE  [name] [id number] DOB: [birth year] DOA: 07/25/2012 PCP: Elbert Ewings, FNP   HPI/Subjective: Has some wheezing and, cough and shortness of breath.  Assessment/Plan:  CAP -Patchy infiltrates on the CT scan consistent with pneumonia. -Although infiltrates are atypical, last CD4 count is 560 she has very low risk to develop PCP. -Patient is on IV Bactrim, will discontinue. Discussed with Dr. Megan Salon of ID service. -Continue management for community-acquired pneumonia with Levaquin. -Supportive management with antitussives, mucolytics and oxygen as needed. -Patient is chronic tobacco user, cannot rule out COPD.  HIV disease -Seems to be controlled, continue ART medications. -CD4 count in April was 560.  Hypertension -Continue preadmission oral medications.  Diabetes mellitus type 2 -Continue carbohydrate modified diet, check hemoglobin A1c. -Expect high CBGs with steroids on board.  Code Status: Full code Family Communication: Plan discussed with patient Disposition Plan: Remain inpatient   Consultants:  None  Procedures:  None  Antibiotics:  Levaquin.  Bactrim >> DC on 7/26   Objective: Filed Vitals:   07/26/12 2052 07/26/12 2207 07/27/12 0526 07/27/12 0724  BP:  138/80 130/86   Pulse: 86 89 95   Temp:  97.6 F (36.4 C) 97.7 F (36.5 C)   TempSrc:  Oral Oral   Resp: 18 18 18    Height:      Weight:      SpO2: 92% 90% 90% 96%   No intake or output data in the 24 hours ending 07/27/12 1537 Filed Weights   07/25/12 2100  Weight: 121.1 kg (266 lb 15.6 oz)    Exam: General: Alert and awake, oriented x3, not in any acute distress. HEENT: anicteric sclera, pupils reactive to light and accommodation, EOMI CVS: S1-S2 clear, no murmur rubs or gallops Chest: Significant bilateral wheezing Abdomen: soft nontender, nondistended, normal bowel sounds, no organomegaly Extremities: no cyanosis, clubbing or edema  noted bilaterally Neuro: Cranial nerves II-XII intact, no focal neurological deficits   Data Reviewed: Basic Metabolic Panel:  Recent Labs Lab 07/25/12 1538 07/25/12 1542 07/27/12 0637  NA 124* 126* 121*  K 4.7 4.7 5.0  CL 85* 94* 83*  CO2 24  --  25  GLUCOSE 191* 192* 302*  BUN 13 15 24*  CREATININE 0.85 0.90 1.01  CALCIUM 9.2  --  9.7   Liver Function Tests: No results found for this basename: AST, ALT, ALKPHOS, BILITOT, PROT, ALBUMIN,  in the last 168 hours No results found for this basename: LIPASE, AMYLASE,  in the last 168 hours No results found for this basename: AMMONIA,  in the last 168 hours CBC:  Recent Labs Lab 07/25/12 1538 07/25/12 1542 07/27/12 0637  WBC 17.8*  --  19.2*  NEUTROABS 16.4*  --   --   HGB 10.9* 12.6 10.3*  HCT 33.2* 37.0 31.8*  MCV 84.9  --  85.5  PLT 387  --  408*   Cardiac Enzymes:  Recent Labs Lab 07/25/12 1538  TROPONINI <0.30   BNP (last 3 results)  Recent Labs  07/25/12 1538  PROBNP 68.4   CBG:  Recent Labs Lab 07/26/12 1724 07/26/12 1820 07/26/12 2203 07/27/12 0816 07/27/12 1219  GLUCAP 67* 172* 238* 318* 190*    Recent Results (from the past 240 hour(s))  CULTURE, BLOOD (ROUTINE X 2)     Status: None   Collection Time    07/25/12  7:02 PM      Result Value Range Status   Specimen Description BLOOD FOREARM RIGHT   Final  Special Requests BOTTLES DRAWN AEROBIC ONLY 8CC   Final   Culture  Setup Time 07/26/2012 07:29   Final   Culture     Final   Value:        BLOOD CULTURE RECEIVED NO GROWTH TO DATE CULTURE WILL BE HELD FOR 5 DAYS BEFORE ISSUING A FINAL NEGATIVE REPORT   Report Status PENDING   Incomplete  CULTURE, BLOOD (ROUTINE X 2)     Status: None   Collection Time    07/25/12  7:10 PM      Result Value Range Status   Specimen Description BLOOD HAND RIGHT   Final   Special Requests BOTTLES DRAWN AEROBIC ONLY [facility]   Final   Culture  Setup Time 07/26/2012 07:29   Final   Culture     Final   Value:         BLOOD CULTURE RECEIVED NO GROWTH TO DATE CULTURE WILL BE HELD FOR 5 DAYS BEFORE ISSUING A FINAL NEGATIVE REPORT   Report Status PENDING   Incomplete     Studies: Ct Angio Chest Pe W/cm &/or Wo Cm  07/25/2012   *RADIOLOGY REPORT*  Clinical Data: Increasing chest pain and shortness of breath for 3 days.  CT ANGIOGRAPHY CHEST  Technique:  Multidetector CT imaging of the chest using the standard protocol during bolus administration of intravenous contrast. Multiplanar reconstructed images including MIPs were obtained and reviewed to evaluate the vascular anatomy.  Contrast: 154mL OMNIPAQUE IOHEXOL 350 MG/ML SOLN  Comparison: Chest x-ray dated 07/25/2012  Findings: There are no pulmonary emboli.  There are multiple small patchy areas of infiltrate bilaterally.  No effusions.  Heart size is at the upper limits of normal.  Pulmonary vascularity is normal. There are a few small lymph nodes in the mediastinum.  No acute osseous abnormality.  Accentuation of the thoracic kyphosis.  IMPRESSION:  1.  No pulmonary emboli. 2.  Multiple small patchy areas of infiltrate in both lungs.  The possibility of opportunistic infection should be considered in this patient with HIV disease.   Original Report Authenticated By: Lorriane Shire, M.D.   Dg Chest Port 1 View  07/25/2012   *RADIOLOGY REPORT*  Clinical Data: Shortness of breath with chest pain and cough for 3 days.  Asthma.  Smoker.  PORTABLE CHEST - 1 VIEW  Comparison: 01/01/2012  Findings: Mildly degraded exam due to AP portable technique and patient body habitus.  There are also multiple leads and wires projecting over the chest. Mild convex right thoracic spine curvature.  Heart size upper normal, accentuated by AP portable technique. No pleural effusion or pneumothorax.  No congestive failure.  Mildly low lung volumes. Clear lungs.  IMPRESSION: Borderline cardiomegaly, without acute disease.   Original Report Authenticated By: Abigail Miyamoto, M.D.    Scheduled  Meds: . albuterol  2.5 mg Nebulization Q6H  . antiseptic oral rinse  15 mL Mouth Rinse BID  . atenolol  25 mg Oral q morning - 10a  . budesonide-formoterol  2 puff Inhalation BID  . carbamazepine  600 mg Oral BID  . docusate sodium  100 mg Oral BID  . efavirenz-emtricitabine-tenofovir  1 tablet Oral QHS  . estradiol  2 mg Oral Daily  . fluconazole  100 mg Oral Daily  . fluticasone  1 spray Each Nare Daily  . gabapentin  600 mg Oral TID  . gemfibrozil  600 mg Oral BID AC  . glipiZIDE  10 mg Oral BID AC  . guaiFENesin  1,200 mg  Oral BID  . heparin  5,000 Units Subcutaneous Q8H  . hydrochlorothiazide  25 mg Oral q morning - 10a  . insulin aspart  0-20 Units Subcutaneous TID WC  . insulin aspart  0-5 Units Subcutaneous QHS  . insulin aspart  25 Units Subcutaneous TID AC  . insulin glargine  90 Units Subcutaneous QHS  . levofloxacin (LEVAQUIN) IV  750 mg Intravenous Q24H  . methylPREDNISolone (SOLU-MEDROL) injection  40 mg Intravenous Q6H  . omega-3 acid ethyl esters  1 g Oral BID  . OXcarbazepine  600 mg Oral Daily  . pantoprazole  40 mg Oral q morning - 10a  . risperidone  4 mg Oral QHS   Continuous Infusions:   Active Problems:   HIV DISEASE   DIABETES MELLITUS, TYPE II   HYPERLIPIDEMIA   DEPRESSION   HYPERTENSION   Tobacco use disorder   Hyponatremia   CAP (community acquired pneumonia)    Time spent: 26 minutes    [facility] Pager [phone]. If 7PM-7AM, please contact night-coverage at [email or web address], password [facility] 07/27/2012, 3:37 PM  LOS: 2 days

## 2012-07-27 NOTE — Progress Notes (Signed)
Pt has asked. Per her request, that she only takes one Gabapentin tonight.

## 2012-07-28 DIAGNOSIS — B370 Candidal stomatitis: Secondary | ICD-10-CM

## 2012-07-28 LAB — CBC
HCT: 31.2 % — ABNORMAL LOW (ref 36.0–46.0)
Hemoglobin: 10.5 g/dL — ABNORMAL LOW (ref 12.0–15.0)
MCHC: 33.7 g/dL (ref 30.0–36.0)
MCV: 84.8 fL (ref 78.0–100.0)
RDW: 17.1 % — ABNORMAL HIGH (ref 11.5–15.5)

## 2012-07-28 LAB — SODIUM, URINE, RANDOM: Sodium, Ur: 47 mEq/L

## 2012-07-28 LAB — PNEUMOCYSTIS JIROVECI SMEAR BY DFA

## 2012-07-28 LAB — BASIC METABOLIC PANEL
BUN: 32 mg/dL — ABNORMAL HIGH (ref 6–23)
CO2: 25 mEq/L (ref 19–32)
Chloride: 81 mEq/L — ABNORMAL LOW (ref 96–112)
Creatinine, Ser: 1.2 mg/dL — ABNORMAL HIGH (ref 0.50–1.10)

## 2012-07-28 LAB — OSMOLALITY, URINE: Osmolality, Ur: 450 mOsm/kg (ref 390–1090)

## 2012-07-28 LAB — OSMOLALITY: Osmolality: 278 mOsm/kg (ref 275–300)

## 2012-07-28 LAB — GLUCOSE, CAPILLARY
Glucose-Capillary: 306 mg/dL — ABNORMAL HIGH (ref 70–99)
Glucose-Capillary: 143 mg/dL — ABNORMAL HIGH (ref 70–99)
Glucose-Capillary: 102 mg/dL — ABNORMAL HIGH (ref 70–99)
Glucose-Capillary: 196 mg/dL — ABNORMAL HIGH (ref 70–99)

## 2012-07-28 NOTE — Progress Notes (Signed)
[facility] PROGRESS NOTE  [name] [id number] DOB: December 27, 1965 DOA: 07/25/2012 PCP: Elbert Ewings, FNP   HPI/Subjective: Reports she is still feeling poorly  Assessment/Plan:  CAP -still with significant wheeze and rhonchi -Patchy infiltrates on the CT scan consistent with pneumonia. -Although infiltrates are atypical, last CD4 count is 560 she has very low risk to develop PCP. -Patient is on IV Bactrim, will discontinue. Discussed with Dr. Megan Salon of ID service. -Continue management for community-acquired pneumonia with Levaquin. -Supportive management with antitussives, mucolytics, bronchodilators, and oxygen as needed. -Patient is chronic tobacco user, cannot rule out COPD.  Hyponatremia -Acute on Chronic -Likely secondary to PnA vs Tegretol -Urine Osmols 450 -Urine Sodium 47  HIV disease -Seems to be controlled, continue ART medications. -CD4 count in April was 560.  Hypertension -Continue preadmission oral medications.  Diabetes mellitus type 2 -Continue carbohydrate modified diet, check hemoglobin A1c. -Continue glipizide, Lantus and novolog -Expect high CBGs with steroids on board.  Code Status: Full code Family Communication: Plan discussed with patient Disposition Plan: Remain inpatient   Consultants:  None  Procedures:  None  Antibiotics:  Levaquin.  Bactrim >> DC on 7/26   Objective: Filed Vitals:   07/27/12 1611 07/27/12 2027 07/28/12 1017 07/28/12 1528  BP: 160/85 98/66 110/87 122/70  Pulse: 82 88  84  Temp: 98.8 F (37.1 C) 98.5 F (36.9 C)  98.2 F (36.8 C)  TempSrc: Oral Oral  Oral  Resp: 20 20  18   Height:      Weight:      SpO2: 92% 91%  94%    Intake/Output Summary (Last 24 hours) at 07/28/12 1825 Last data filed at 07/27/12 2000  Gross per 24 hour  Intake      0 ml  Output    350 ml  Net   -350 ml   Filed Weights   07/25/12 2100  Weight: 121.1 kg (266 lb 15.6 oz)    Exam: General: Alert and  awake, oriented x3, not in any acute distress. Eating breakfast HEENT: anicteric sclera, pupils reactive to light and accommodation, EOMI CVS: S1-S2 clear, no murmur rubs or gallops Chest: Significant bilateral wheezing Abdomen: soft nontender, nondistended, normal bowel sounds, no organomegaly Extremities: no cyanosis, clubbing or edema noted bilaterally Neuro: Cranial nerves II-XII intact, no focal neurological deficits   Data Reviewed: Basic Metabolic Panel:  Recent Labs Lab 07/25/12 1538 07/25/12 1542 07/27/12 0637 07/28/12 0515  NA 124* 126* 121* 121*  K 4.7 4.7 5.0 5.0  CL 85* 94* 83* 81*  CO2 24  --  25 25  GLUCOSE 191* 192* 302* 274*  BUN 13 15 24* 32*  CREATININE 0.85 0.90 1.01 1.20*  CALCIUM 9.2  --  9.7 9.8   Liver Function Tests: No results found for this basename: AST, ALT, ALKPHOS, BILITOT, PROT, ALBUMIN,  in the last 168 hours No results found for this basename: LIPASE, AMYLASE,  in the last 168 hours No results found for this basename: AMMONIA,  in the last 168 hours CBC:  Recent Labs Lab 07/25/12 1538 07/25/12 1542 07/27/12 0637 07/28/12 0515  WBC 17.8*  --  19.2* 18.6*  NEUTROABS 16.4*  --   --   --   HGB 10.9* 12.6 10.3* 10.5*  HCT 33.2* 37.0 31.8* 31.2*  MCV 84.9  --  85.5 84.8  PLT 387  --  408* 415*   Cardiac Enzymes:  Recent Labs Lab 07/25/12 1538  TROPONINI <0.30   BNP (last 3 results)  Recent Labs  07/25/12 1538  PROBNP 68.4   CBG:  Recent Labs Lab 07/27/12 1738 07/27/12 2109 07/28/12 0732 07/28/12 1219 07/28/12 1721  GLUCAP 166* 183* 306* 143* 102*    Recent Results (from the past 240 hour(s))  CULTURE, BLOOD (ROUTINE X 2)     Status: None   Collection Time    07/25/12  7:02 PM      Result Value Range Status   Specimen Description BLOOD FOREARM RIGHT   Final   Special Requests BOTTLES DRAWN AEROBIC ONLY 8CC   Final   Culture  Setup Time 07/26/2012 07:29   Final   Culture     Final   Value:        BLOOD CULTURE  RECEIVED NO GROWTH TO DATE CULTURE WILL BE HELD FOR 5 DAYS BEFORE ISSUING A FINAL NEGATIVE REPORT   Report Status PENDING   Incomplete  CULTURE, BLOOD (ROUTINE X 2)     Status: None   Collection Time    07/25/12  7:10 PM      Result Value Range Status   Specimen Description BLOOD HAND RIGHT   Final   Special Requests BOTTLES DRAWN AEROBIC ONLY [facility]   Final   Culture  Setup Time 07/26/2012 07:29   Final   Culture     Final   Value:        BLOOD CULTURE RECEIVED NO GROWTH TO DATE CULTURE WILL BE HELD FOR 5 DAYS BEFORE ISSUING A FINAL NEGATIVE REPORT   Report Status PENDING   Incomplete  CLOSTRIDIUM DIFFICILE BY PCR     Status: None   Collection Time    07/28/12  2:54 AM      Result Value Range Status   C difficile by pcr NEGATIVE  NEGATIVE Final     Studies: No results found.  Scheduled Meds: . albuterol  2.5 mg Nebulization Q6H  . antiseptic oral rinse  15 mL Mouth Rinse BID  . atenolol  25 mg Oral q morning - 10a  . budesonide-formoterol  2 puff Inhalation BID  . carbamazepine  600 mg Oral BID  . docusate sodium  100 mg Oral BID  . efavirenz-emtricitabine-tenofovir  1 tablet Oral QHS  . estradiol  2 mg Oral Daily  . fluconazole  100 mg Oral Daily  . fluticasone  1 spray Each Nare Daily  . gabapentin  300 mg Oral TID  . gemfibrozil  600 mg Oral BID AC  . glipiZIDE  10 mg Oral BID AC  . guaiFENesin  1,200 mg Oral BID  . heparin  5,000 Units Subcutaneous Q8H  . hydrochlorothiazide  25 mg Oral q morning - 10a  . insulin aspart  0-20 Units Subcutaneous TID WC  . insulin aspart  0-5 Units Subcutaneous QHS  . insulin aspart  25 Units Subcutaneous TID AC  . insulin glargine  90 Units Subcutaneous QHS  . levofloxacin (LEVAQUIN) IV  750 mg Intravenous Q24H  . methylPREDNISolone (SOLU-MEDROL) injection  40 mg Intravenous Q6H  . omega-3 acid ethyl esters  1 g Oral BID  . OXcarbazepine  600 mg Oral Daily  . pantoprazole  40 mg Oral q morning - 10a  . risperidone  4 mg Oral QHS    Continuous Infusions:   Active Problems:   HIV DISEASE   DIABETES MELLITUS, TYPE II   HYPERLIPIDEMIA   DEPRESSION   HYPERTENSION   Tobacco use disorder   Hyponatremia   CAP (community acquired pneumonia)    Time spent: 35 minutes  Karen Kitchens [facility] Pager [phone]. If 7PM-7AM, please contact night-coverage at [email or web address], password [facility] 07/28/2012, 6:25 PM  LOS: 3 days

## 2012-07-29 DIAGNOSIS — A419 Sepsis, unspecified organism: Secondary | ICD-10-CM

## 2012-07-29 LAB — CBC
Hemoglobin: 10 g/dL — ABNORMAL LOW (ref 12.0–15.0)
MCH: 27.5 pg (ref 26.0–34.0)
Platelets: 360 10*3/uL (ref 150–400)
RBC: 3.63 MIL/uL — ABNORMAL LOW (ref 3.87–5.11)
WBC: 13.3 10*3/uL — ABNORMAL HIGH (ref 4.0–10.5)

## 2012-07-29 LAB — COMPREHENSIVE METABOLIC PANEL
AST: 14 U/L (ref 0–37)
BUN: 25 mg/dL — ABNORMAL HIGH (ref 6–23)
CO2: 25 mEq/L (ref 19–32)
Calcium: 9.6 mg/dL (ref 8.4–10.5)
Creatinine, Ser: 0.95 mg/dL (ref 0.50–1.10)
GFR calc non Af Amer: 70 mL/min — ABNORMAL LOW (ref >90)
Total Bilirubin: 0.1 mg/dL — ABNORMAL LOW (ref 0.3–1.2)

## 2012-07-29 LAB — GLUCOSE, CAPILLARY
Glucose-Capillary: 360 mg/dL — ABNORMAL HIGH (ref 70–99)
Glucose-Capillary: 358 mg/dL — ABNORMAL HIGH (ref 70–99)
Glucose-Capillary: 121 mg/dL — ABNORMAL HIGH (ref 70–99)

## 2012-07-29 MED ORDER — FUROSEMIDE 10 MG/ML IJ SOLN
40.0000 mg | Freq: Once | INTRAMUSCULAR | Status: AC
  Administered 2012-07-29: 40 mg via INTRAVENOUS
  Filled 2012-07-29: qty 4

## 2012-07-29 MED ORDER — LEVOFLOXACIN 750 MG PO TABS
750.0000 mg | ORAL_TABLET | Freq: Every day | ORAL | Status: DC
  Administered 2012-07-29 – 2012-07-30 (×2): 750 mg via ORAL
  Filled 2012-07-29 (×3): qty 1

## 2012-07-29 MED ORDER — INSULIN ASPART 100 UNIT/ML ~~LOC~~ SOLN
30.0000 [IU] | Freq: Three times a day (TID) | SUBCUTANEOUS | Status: DC
  Administered 2012-07-29 – 2012-07-30 (×3): 30 [IU] via SUBCUTANEOUS

## 2012-07-29 MED ORDER — SODIUM CHLORIDE 0.9 % IV SOLN
INTRAVENOUS | Status: DC
  Administered 2012-07-29 – 2012-07-30 (×2): via INTRAVENOUS

## 2012-07-29 MED ORDER — METHYLPREDNISOLONE SODIUM SUCC 40 MG IJ SOLR
40.0000 mg | Freq: Two times a day (BID) | INTRAMUSCULAR | Status: DC
  Administered 2012-07-29 – 2012-07-30 (×2): 40 mg via INTRAVENOUS
  Filled 2012-07-29 (×3): qty 1

## 2012-07-29 NOTE — Progress Notes (Signed)
Addendum  Patient seen and examined, chart and data base reviewed.  I agree with the above assessment and plan.  For full details please see Mrs. Imogene Burn PA note.  CAP, slow improvement, cont. Current regimen.   Birdie Hopes, MD [facility] Pager: [phone] 07/29/2012, 8:07 AM

## 2012-07-29 NOTE — Progress Notes (Signed)
Addendum  Patient seen and examined, chart and data base reviewed.  I agree with the above assessment and plan.  For full details please see Mrs. Imogene Burn PA note.  Community acquired pneumonia continue current management.  Hyponatremia, acute on chronic,given some IV fluids and Lasix today. I will also restrict her fluids to 1 L per day   Birdie Hopes, MD [facility] Pager: [phone] 07/29/2012, 1:37 PM

## 2012-07-29 NOTE — Progress Notes (Signed)
[facility] PROGRESS NOTE  [name] [id number] DOB: 08/24/1965 DOA: 07/25/2012 PCP: Elbert Ewings, FNP   HPI/Subjective: Reports she is still feeling poorly  Assessment/Plan:  CAP -still with significant wheeze in all lung areas -Patchy infiltrates on the CT scan consistent with pneumonia. -Although infiltrates are atypical, last CD4 count is 560 she has very low risk to develop PCP. -Bactrim discontinued. Discussed with Dr. Megan Salon of ID service. -Continue management for community-acquired pneumonia with Levaquin. -Supportive management with antitussives, mucolytics, scheduled bronchodilators, and oxygen as needed. -Patient is chronic tobacco user, cannot rule out COPD.  Hyponatremia -Acute on Chronic -Likely secondary to PnA vs Tegretol -Urine Osmols 450 -Urine Sodium 47 -Try short term IVF with one dose of IV lasix  HIV disease -Seems to be controlled, continue ART medications. -CD4 count in April was 560.  Hypertension -Continue preadmission oral medications.  Diabetes mellitus type 2 -Continue carbohydrate modified diet, check hemoglobin A1c. -Continue glipizide, Lantus and novolog (High doses of Insulin) -Expect high CBGs with steroids on board.  Code Status: Full code Family Communication: Plan discussed with patient Disposition Plan: Remain inpatient   Consultants:  None  Procedures:  None  Antibiotics:  Levaquin.  Bactrim >> DC on 7/26   Objective: Filed Vitals:   07/29/12 0248 07/29/12 0521 07/29/12 0938 07/29/12 1014  BP:  114/70  119/87  Pulse: 80 84  78  Temp:  98.2 F (36.8 C)    TempSrc:  Oral    Resp: 18 18    Height:      Weight:      SpO2: 94% 100% 92%     Intake/Output Summary (Last 24 hours) at 07/29/12 1328 Last data filed at 07/29/12 0900  Gross per 24 hour  Intake    678 ml  Output      0 ml  Net    678 ml   Filed Weights   07/25/12 2100  Weight: 121.1 kg (266 lb 15.6 oz)    Exam: General:  Alert and awake, oriented x3, on oxygen via n/c HEENT: anicteric sclera, pupils reactive to light and accommodation, EOMI CVS: S1-S2 clear, no murmur rubs or gallops Chest: Significant bilateral wheezing (no rhonchi or rales) Abdomen: obese, soft nontender, nondistended, normal bowel sounds, no organomegaly Extremities: no cyanosis, clubbing or edema noted bilaterally Neuro: Cranial nerves II-XII intact, no focal neurological deficits   Data Reviewed: Basic Metabolic Panel:  Recent Labs Lab 07/25/12 1538 07/25/12 1542 07/27/12 0637 07/28/12 0515 07/29/12 0527  NA 124* 126* 121* 121* 121*  K 4.7 4.7 5.0 5.0 4.8  CL 85* 94* 83* 81* 83*  CO2 24  --  25 25 25   GLUCOSE 191* 192* 302* 274* 332*  BUN 13 15 24* 32* 25*  CREATININE 0.85 0.90 1.01 1.20* 0.95  CALCIUM 9.2  --  9.7 9.8 9.6   Liver Function Tests:  Recent Labs Lab 07/29/12 0527  AST 14  ALT 13  ALKPHOS 93  BILITOT 0.1*  PROT 7.8  ALBUMIN 3.0*   CBC:  Recent Labs Lab 07/25/12 1538 07/25/12 1542 07/27/12 0637 07/28/12 0515 07/29/12 0527  WBC 17.8*  --  19.2* 18.6* 13.3*  NEUTROABS 16.4*  --   --   --   --   HGB 10.9* 12.6 10.3* 10.5* 10.0*  HCT 33.2* 37.0 31.8* 31.2* 30.6*  MCV 84.9  --  85.5 84.8 84.3  PLT 387  --  408* 415* 360   Cardiac Enzymes:  Recent Labs Lab 07/25/12 1538  TROPONINI <  0.30   BNP (last 3 results)  Recent Labs  07/25/12 1538  PROBNP 68.4   CBG:  Recent Labs Lab 07/28/12 1219 07/28/12 1721 07/28/12 2146 07/29/12 0817 07/29/12 1202  GLUCAP 143* 102* 196* 360* 358*    Recent Results (from the past 240 hour(s))  CULTURE, BLOOD (ROUTINE X 2)     Status: None   Collection Time    07/25/12  7:02 PM      Result Value Range Status   Specimen Description BLOOD FOREARM RIGHT   Final   Special Requests BOTTLES DRAWN AEROBIC ONLY 8CC   Final   Culture  Setup Time 07/26/2012 07:29   Final   Culture     Final   Value:        BLOOD CULTURE RECEIVED NO GROWTH TO DATE  CULTURE WILL BE HELD FOR 5 DAYS BEFORE ISSUING A FINAL NEGATIVE REPORT   Report Status PENDING   Incomplete  CULTURE, BLOOD (ROUTINE X 2)     Status: None   Collection Time    07/25/12  7:10 PM      Result Value Range Status   Specimen Description BLOOD HAND RIGHT   Final   Special Requests BOTTLES DRAWN AEROBIC ONLY [facility]   Final   Culture  Setup Time 07/26/2012 07:29   Final   Culture     Final   Value:        BLOOD CULTURE RECEIVED NO GROWTH TO DATE CULTURE WILL BE HELD FOR 5 DAYS BEFORE ISSUING A FINAL NEGATIVE REPORT   Report Status PENDING   Incomplete  PNEUMOCYSTIS JIROVECI SMEAR BY DFA     Status: None   Collection Time    07/26/12  7:07 AM      Result Value Range Status   Specimen Source-[facility] SPUTUM   Final   Pneumocystis jiroveci Ag NEGATIVE   Final   Comment: Performed at [place] DIFFICILE BY PCR     Status: None   Collection Time    07/28/12  2:54 AM      Result Value Range Status   C difficile by pcr NEGATIVE  NEGATIVE Final     Studies: No results found.  Scheduled Meds: . albuterol  2.5 mg Nebulization Q6H  . antiseptic oral rinse  15 mL Mouth Rinse BID  . atenolol  25 mg Oral q morning - 10a  . budesonide-formoterol  2 puff Inhalation BID  . carbamazepine  600 mg Oral BID  . docusate sodium  100 mg Oral BID  . efavirenz-emtricitabine-tenofovir  1 tablet Oral QHS  . estradiol  2 mg Oral Daily  . fluconazole  100 mg Oral Daily  . fluticasone  1 spray Each Nare Daily  . furosemide  40 mg Intravenous Once  . gabapentin  300 mg Oral TID  . gemfibrozil  600 mg Oral BID AC  . glipiZIDE  10 mg Oral BID AC  . guaiFENesin  1,200 mg Oral BID  . heparin  5,000 Units Subcutaneous Q8H  . hydrochlorothiazide  25 mg Oral q morning - 10a  . insulin aspart  0-20 Units Subcutaneous TID WC  . insulin aspart  0-5 Units Subcutaneous QHS  . insulin aspart  30 Units Subcutaneous TID AC  . insulin glargine  90 Units Subcutaneous QHS  .  levofloxacin  750 mg Oral QHS  . methylPREDNISolone (SOLU-MEDROL) injection  40 mg Intravenous Q12H  . omega-3 acid ethyl esters  1 g Oral  BID  . OXcarbazepine  600 mg Oral Daily  . pantoprazole  40 mg Oral q morning - 10a  . risperidone  4 mg Oral QHS   Continuous Infusions: . sodium chloride 100 mL/hr at 07/29/12 1320    Active Problems:   HIV DISEASE   DIABETES MELLITUS, TYPE II   HYPERLIPIDEMIA   DEPRESSION   HYPERTENSION   Tobacco use disorder   Hyponatremia   CAP (community acquired pneumonia)    Karen Kitchens [facility] Pager [phone]. If 7PM-7AM, please contact night-coverage at [email or web address], password [facility] 07/29/2012, 1:28 PM  LOS: 4 days

## 2012-07-29 NOTE — Progress Notes (Signed)
PT Cancellation Note  Patient Details Name: [name] MRN: [id number] DOB: [birth year]   Cancelled Treatment:    Reason Eval/Treat Not Completed: Other (comment) Pt declined ambulation and states she uses w/c at home and would gladly use w/c for mobility here.  Pt agreeable to PT eval however wished to visit with family members currently in room first, so will likely see tomorrow for evaluation.   [name],[name] 07/29/2012, 3:11 PM Carmelia Bake, PT, DPT 07/29/2012 Pager: [phone]

## 2012-07-29 NOTE — Progress Notes (Addendum)
Inpatient Diabetes Program Recommendations  AACE/ADA: New Consensus Statement on Inpatient Glycemic Control (2013)  Target Ranges:  Prepandial:   less than 140 mg/dL      Peak postprandial:   less than 180 mg/dL (1-2 hours)      Critically ill patients:  140 - 180 mg/dL   Results for [name], [name] (MRN [id number]) as of 07/29/2012 12:12  Ref. Range 07/28/2012 07:32 07/28/2012 12:19 07/28/2012 17:21 07/28/2012 21:46 07/29/2012 08:17  Glucose-Capillary Latest Range: 70-99 mg/dL 306 (H) 143 (H) 102 (H) 196 (H) 360 (H)    Inpatient Diabetes Program Recommendations Insulin - Basal: Please consider increasing Lantus to 95 units QHS. Insulin: Correction:  Please consider changing frequency of Novolog correction to Q4H in order to monitor blood glucose more frequently for tighter glucose control while inpatient.  Note: Fasting blood glucose over the past two mornings noted to be greater than 300 mg/dl.  Please consider increasing Lantus to 95 units QHS and changing Novolog correction to Q4H.  Will continue to follow.  Thanks, Barnie Alderman, RN, MSN, CCRN Diabetes Coordinator Inpatient Diabetes Program [phone]

## 2012-07-30 DIAGNOSIS — J45901 Unspecified asthma with (acute) exacerbation: Secondary | ICD-10-CM

## 2012-07-30 DIAGNOSIS — I10 Essential (primary) hypertension: Secondary | ICD-10-CM

## 2012-07-30 LAB — CBC
HCT: 30.2 % — ABNORMAL LOW (ref 36.0–46.0)
MCHC: 33.1 g/dL (ref 30.0–36.0)
RDW: 16.7 % — ABNORMAL HIGH (ref 11.5–15.5)
WBC: 10.9 10*3/uL — ABNORMAL HIGH (ref 4.0–10.5)

## 2012-07-30 LAB — BASIC METABOLIC PANEL
BUN: 30 mg/dL — ABNORMAL HIGH (ref 6–23)
Chloride: 85 mEq/L — ABNORMAL LOW (ref 96–112)
Creatinine, Ser: 1.1 mg/dL (ref 0.50–1.10)
GFR calc Af Amer: 68 mL/min — ABNORMAL LOW (ref >90)
GFR calc non Af Amer: 58 mL/min — ABNORMAL LOW (ref >90)
Potassium: 4.7 mEq/L (ref 3.5–5.1)

## 2012-07-30 LAB — GLUCOSE, CAPILLARY
Glucose-Capillary: 131 mg/dL — ABNORMAL HIGH (ref 70–99)
Glucose-Capillary: 173 mg/dL — ABNORMAL HIGH (ref 70–99)

## 2012-07-30 LAB — HEMOGLOBIN A1C
Hgb A1c MFr Bld: 9 % — ABNORMAL HIGH (ref <5.7)
Mean Plasma Glucose: 212 mg/dL — ABNORMAL HIGH (ref <117)

## 2012-07-30 MED ORDER — INSULIN ASPART 100 UNIT/ML ~~LOC~~ SOLN
25.0000 [IU] | Freq: Three times a day (TID) | SUBCUTANEOUS | Status: DC
  Administered 2012-07-30 – 2012-07-31 (×3): 25 [IU] via SUBCUTANEOUS

## 2012-07-30 MED ORDER — PREDNISONE 50 MG PO TABS
60.0000 mg | ORAL_TABLET | Freq: Once | ORAL | Status: DC
  Filled 2012-07-30: qty 1

## 2012-07-30 MED ORDER — RISPERIDONE 2 MG PO TABS
4.0000 mg | ORAL_TABLET | Freq: Every day | ORAL | Status: DC
  Administered 2012-07-30: 4 mg via ORAL
  Filled 2012-07-30 (×2): qty 2

## 2012-07-30 MED ORDER — PREDNISONE 50 MG PO TABS
60.0000 mg | ORAL_TABLET | Freq: Once | ORAL | Status: AC
  Administered 2012-07-30: 60 mg via ORAL
  Filled 2012-07-30: qty 1

## 2012-07-30 MED ORDER — FUROSEMIDE 10 MG/ML IJ SOLN
40.0000 mg | Freq: Once | INTRAMUSCULAR | Status: AC
  Administered 2012-07-30: 40 mg via INTRAVENOUS
  Filled 2012-07-30: qty 4

## 2012-07-30 MED ORDER — INSULIN GLARGINE 100 UNIT/ML ~~LOC~~ SOLN
95.0000 [IU] | Freq: Every day | SUBCUTANEOUS | Status: DC
  Administered 2012-07-30: 95 [IU] via SUBCUTANEOUS
  Filled 2012-07-30 (×2): qty 0.95

## 2012-07-30 MED ORDER — OXCARBAZEPINE 300 MG PO TABS
600.0000 mg | ORAL_TABLET | ORAL | Status: AC
  Administered 2012-07-30: 600 mg via ORAL
  Filled 2012-07-30: qty 2

## 2012-07-30 MED ORDER — PREDNISONE 50 MG PO TABS
60.0000 mg | ORAL_TABLET | Freq: Every day | ORAL | Status: DC
  Filled 2012-07-30: qty 1

## 2012-07-30 MED ORDER — PREDNISONE 20 MG PO TABS
40.0000 mg | ORAL_TABLET | Freq: Every day | ORAL | Status: DC
  Administered 2012-07-31: 40 mg via ORAL
  Filled 2012-07-30 (×2): qty 2

## 2012-07-30 MED ORDER — OXCARBAZEPINE 300 MG PO TABS
600.0000 mg | ORAL_TABLET | Freq: Every day | ORAL | Status: DC
  Filled 2012-07-30: qty 2

## 2012-07-30 MED ORDER — POTASSIUM CHLORIDE CRYS ER 20 MEQ PO TBCR
20.0000 meq | EXTENDED_RELEASE_TABLET | Freq: Once | ORAL | Status: AC
  Administered 2012-07-30: 20 meq via ORAL
  Filled 2012-07-30: qty 1

## 2012-07-30 NOTE — Progress Notes (Signed)
[facility] PROGRESS NOTE  ACIE [name] [id number] DOB: 04/07/1965 DOA: 07/25/2012 PCP: Elbert Ewings, FNP   HPI/Subjective: Reports she feels much better.  She is becoming frustrated with being in the hospital.  Assessment/Plan:  CAP -Now with minimal wheeze in lung bases. -Patchy infiltrates on the CT scan consistent with pneumonia. -Although infiltrates are atypical, last CD4 count is 560 she has very low risk to develop PCP. -Bactrim discontinued. Discussed with Dr. Megan Salon of ID service. -Continue management for community-acquired pneumonia with Levaquin. -Supportive management with antitussives, mucolytics, steroids, scheduled bronchodilators, and oxygen as needed. Change IV Solumedrol to oral prednisone 7/30.  Check ambulating oxygen sats and wean oxygen -Patient is chronic tobacco user, cannot rule out COPD.  Hyponatremia -Acute on Chronic -Likely secondary to PnA vs Tegretol -Urine Osmols 450 -Urine Sodium 47 -D/C IVF, fluid restriction, Give another dose of IV lasix today (40 mg)  HIV disease -Seems to be controlled, continue ART medications. -CD4 count in April was 560.  Hypertension -Continue preadmission oral medications.  Diabetes mellitus type 2 -Continue carbohydrate modified diet, check hemoglobin A1c. -Continue glipizide, Lantus and novolog (High doses of Insulin) -Expect high CBGs with steroids on board.  Code Status: Full code Family Communication: Plan discussed with patient Disposition Plan: Likely home tomorrow if electrolytes are improved.   Consultants:  None  Procedures:  None  Antibiotics:  Levaquin.  Bactrim >> DC on 7/26   Objective: Filed Vitals:   07/29/12 2049 07/29/12 2120 07/30/12 0249 07/30/12 1031  BP:  114/61  115/68  Pulse:  78    Temp:  97.9 F (36.6 C)    TempSrc:  Oral    Resp: 20 20    Height:      Weight:      SpO2:  98% 99%     Intake/Output Summary (Last 24 hours) at 07/30/12  1306 Last data filed at 07/30/12 1039  Gross per 24 hour  Intake    400 ml  Output   1000 ml  Net   -600 ml   Filed Weights   07/25/12 2100  Weight: 121.1 kg (266 lb 15.6 oz)    Exam: General: Alert and awake, oriented x3, on oxygen via n/c, appears improved. HEENT: anicteric sclera, pupils reactive to light and accommodation, EOMI CVS: S1-S2 clear, no murmur rubs or gallops Chest: Minimal bilateral wheeze.  No accessory muscle movement. Abdomen: obese, soft nontender, nondistended, normal bowel sounds, no organomegaly Extremities: no cyanosis, clubbing or edema noted bilaterally Neuro: Cranial nerves II-XII intact, no focal neurological deficits   Data Reviewed: Basic Metabolic Panel:  Recent Labs Lab 07/25/12 1538 07/25/12 1542 07/27/12 0637 07/28/12 0515 07/29/12 0527 07/30/12 0635  NA 124* 126* 121* 121* 121* 125*  K 4.7 4.7 5.0 5.0 4.8 4.7  CL 85* 94* 83* 81* 83* 85*  CO2 24  --  25 25 25 27   GLUCOSE 191* 192* 302* 274* 332* 379*  BUN 13 15 24* 32* 25* 30*  CREATININE 0.85 0.90 1.01 1.20* 0.95 1.10  CALCIUM 9.2  --  9.7 9.8 9.6 10.0   Liver Function Tests:  Recent Labs Lab 07/29/12 0527  AST 14  ALT 13  ALKPHOS 93  BILITOT 0.1*  PROT 7.8  ALBUMIN 3.0*   CBC:  Recent Labs Lab 07/25/12 1538 07/25/12 1542 07/27/12 0637 07/28/12 0515 07/29/12 0527 07/30/12 0635  WBC 17.8*  --  19.2* 18.6* 13.3* 10.9*  NEUTROABS 16.4*  --   --   --   --   --  HGB 10.9* 12.6 10.3* 10.5* 10.0* 10.0*  HCT 33.2* 37.0 31.8* 31.2* 30.6* 30.2*  MCV 84.9  --  85.5 84.8 84.3 83.9  PLT 387  --  408* 415* 360 360   Cardiac Enzymes:  Recent Labs Lab 07/25/12 1538  TROPONINI <0.30   BNP (last 3 results)  Recent Labs  07/25/12 1538  PROBNP 68.4   CBG:  Recent Labs Lab 07/29/12 1202 07/29/12 1711 07/29/12 2207 07/30/12 0809 07/30/12 1243  GLUCAP 358* 121* 113* 343* 131*    Recent Results (from the past 240 hour(s))  CULTURE, BLOOD (ROUTINE X 2)      Status: None   Collection Time    07/25/12  7:02 PM      Result Value Range Status   Specimen Description BLOOD FOREARM RIGHT   Final   Special Requests BOTTLES DRAWN AEROBIC ONLY 8CC   Final   Culture  Setup Time 07/26/2012 07:29   Final   Culture     Final   Value:        BLOOD CULTURE RECEIVED NO GROWTH TO DATE CULTURE WILL BE HELD FOR 5 DAYS BEFORE ISSUING A FINAL NEGATIVE REPORT   Report Status PENDING   Incomplete  CULTURE, BLOOD (ROUTINE X 2)     Status: None   Collection Time    07/25/12  7:10 PM      Result Value Range Status   Specimen Description BLOOD HAND RIGHT   Final   Special Requests BOTTLES DRAWN AEROBIC ONLY [facility]   Final   Culture  Setup Time 07/26/2012 07:29   Final   Culture     Final   Value:        BLOOD CULTURE RECEIVED NO GROWTH TO DATE CULTURE WILL BE HELD FOR 5 DAYS BEFORE ISSUING A FINAL NEGATIVE REPORT   Report Status PENDING   Incomplete  PNEUMOCYSTIS JIROVECI SMEAR BY DFA     Status: None   Collection Time    07/26/12  7:07 AM      Result Value Range Status   Specimen Source-[facility] SPUTUM   Final   Pneumocystis jiroveci Ag NEGATIVE   Final   Comment: Performed at [place] DIFFICILE BY PCR     Status: None   Collection Time    07/28/12  2:54 AM      Result Value Range Status   C difficile by pcr NEGATIVE  NEGATIVE Final     Studies: No results found.  Scheduled Meds: . albuterol  2.5 mg Nebulization Q6H  . antiseptic oral rinse  15 mL Mouth Rinse BID  . atenolol  25 mg Oral q morning - 10a  . budesonide-formoterol  2 puff Inhalation BID  . carbamazepine  600 mg Oral BID  . docusate sodium  100 mg Oral BID  . efavirenz-emtricitabine-tenofovir  1 tablet Oral QHS  . estradiol  2 mg Oral Daily  . fluconazole  100 mg Oral Daily  . fluticasone  1 spray Each Nare Daily  . furosemide  40 mg Intravenous Once  . gabapentin  300 mg Oral TID  . gemfibrozil  600 mg Oral BID AC  . glipiZIDE  10 mg Oral BID AC  .  guaiFENesin  1,200 mg Oral BID  . heparin  5,000 Units Subcutaneous Q8H  . hydrochlorothiazide  25 mg Oral q morning - 10a  . insulin aspart  0-20 Units Subcutaneous TID WC  . insulin aspart  0-5 Units Subcutaneous QHS  .  insulin aspart  25 Units Subcutaneous TID AC  . insulin glargine  95 Units Subcutaneous QHS  . levofloxacin  750 mg Oral QHS  . omega-3 acid ethyl esters  1 g Oral BID  . OXcarbazepine  600 mg Oral Daily  . pantoprazole  40 mg Oral q morning - 10a  . [START ON 07/31/2012] predniSONE  60 mg Oral Q breakfast  . predniSONE  60 mg Oral Once  . risperidone  4 mg Oral QHS   Continuous Infusions:    Active Problems:   HIV DISEASE   DIABETES MELLITUS, TYPE II   HYPERLIPIDEMIA   DEPRESSION   HYPERTENSION   Tobacco use disorder   Hyponatremia   CAP (community acquired pneumonia)    Karen Kitchens [facility] Pager [phone]. If 7PM-7AM, please contact night-coverage at [email or web address], password [facility] 07/30/2012, 1:06 PM  LOS: 5 days   Attending - Patient seen and examined, agree with the above assessment and plan. Above revealed, necessary changes were made. She is improving, sodium also slowly coming out. Stop IV fluids, agree with another dose of Lasix today. Suspect she can be discharged tomorrow.  Nena Alexander MD

## 2012-07-30 NOTE — Progress Notes (Addendum)
Inpatient Diabetes Program Recommendations  AACE/ADA: New Consensus Statement on Inpatient Glycemic Control (2013)  Target Ranges:  Prepandial:   less than 140 mg/dL      Peak postprandial:   less than 180 mg/dL (1-2 hours)      Critically ill patients:  140 - 180 mg/dL   Results for [name], [name] (MRN [id number]) as of 07/30/2012 08:25  Ref. Range 07/29/2012 08:17 07/29/2012 12:02 07/29/2012 17:11 07/29/2012 22:07 07/30/2012 08:09  Glucose-Capillary Latest Range: 70-99 mg/dL 360 (H) 358 (H) 121 (H) 113 (H) 343 (H)    Inpatient Diabetes Program Recommendations Insulin - Basal: Please consider increasing Lantus to 95 untis QHS. Correction (SSI): Please consider changing frequency of Novolog correction to Q4H while on steroids. Insulin - Meal Coverage: If Lantus is increased, please consider decreasing meal coverage to Novolog 25 units TID with meals.  Note: Fasting blood glucose over the past 3 days has been greater than 300 mg/dl which is indicative of a need for more basal insulin.  Therefore, please increase Lantus to 95 units QHS.  Also, while on steroids, it would be beneficial for Novolog correction to be Q4H to keep a tighter control on blood glucose.  If Lantus is increased, please consider decreasing meal coverage to Novolog 25 units TID with meals.  Will continue to follow.  07/30/12@12 :62 - Talked with patient about home diabetes management regimen.  Patient reports that she takes Lantus 90 units QHS, Novolog 25-50 units TID (no specific scale, "just a guessing game about how much to take"), Metformin 1000mg  BID, and Glipizide 10 mg BID as an outpatient.  According to the patient, Dr. Rowe Robert helps her with managing her diabetes.  When asked how often she has seen him, she states every 2-3 months.  However, she reports that she has not seen him since last hospital admission which was in December 2013.  In discussing last A1C in the chart of 10.3% on 12-31-11, patient reports that her A1C  usually runs in the 5-6% range.  She states that she has had a lot of personal issues over the past 6 months and has been depressed.  She reports that she is on two anti-depressants and that she feels the depression is getting better.  She admits that she has not been checking her sugars like she knows she should and that she usually checks her blood sugar once or twice a day.  She indicates that her blood glucose runs in the 120-130's in the mornings and 180-190's at night.  She does reports that she has low blood sugars 2-3 times a week on her current regimen and that when her blood sugar is low she knows she overeats and runs her blood sugar up high.  Talked with the patient about importance of checking blood sugars more frequently, appropriate treatment for hypoglycemia, and importance of improving A1C to prevent long-term complications.  Patient verbalized understanding of information discussed and states that she has no further questions or concerns related to her diabetes at this time.   Thanks, Barnie Alderman, RN, MSN, CCRN Diabetes Coordinator Inpatient Diabetes Program [phone]

## 2012-07-30 NOTE — Progress Notes (Signed)
Pt is refusing to walk down hallway to check her oxygen level. She states "I use a wheelchair to get around at home". We will obtain a resting O2 sat only.

## 2012-07-30 NOTE — Care Management Note (Signed)
    Page 1 of 2   07/31/2012     3:10:57 PM   CARE MANAGEMENT NOTE 07/31/2012  Patient:  [name],[name]   Account Number:  [id number]  Date Initiated:  07/30/2012  Documentation initiated by:  Tomi Bamberger  Subjective/Objective Assessment:   dx pna, hx 042  admit- lives with mother. uses w/chair at home to get around in.     Action/Plan:   Anticipated DC Date:  07/31/2012   Anticipated DC Plan:  HOME/SELF CARE      DC Planning Services  CM consult  [place] Program      [facility] Choice  DURABLE MEDICAL EQUIPMENT   Choice offered to / List presented to:  C-1 Patient   DME arranged  [facility]      DME agency  [place].        Status of service:  Completed, signed off Medicare Important Message given?   (If response is "NO", the following Medicare IM given date fields will be blank) Date Medicare IM given:   Date Additional Medicare IM given:    Discharge Disposition:  HOME/SELF CARE  Per UR Regulation:  Reviewed for med. necessity/level of care/duration of stay  If discussed at [place] of Stay Meetings, dates discussed:    Comments:  07/31/12 15:06 Tomi Bamberger RN, BSN [phone] patient dc to home, patient today states she can not afford her medications, ast her with the match program for medications, Larkin Ina with [facility] brought oxygen to patient's room.  Hospital f/u apt set with her PCP.  Also outpt pt form faxed in , patient will need financial ast for outpt pt gave her the phone number [phone] for this.  Pateint has transportation home.  07/30/12 16:25 Jairo Ben RN, BSN [phone] patient lives with mother, she uses a w/chair to mobilize in.  She states she has a PCP, and she can afford her medications.  She states she can not afford outpt physical therapy but for me to fax info in , let them call her to see what kind of pmt plan they can set up for her.  Patient is for dc tomorrow.

## 2012-07-30 NOTE — Progress Notes (Signed)
Additional PT note: SATURATION QUALIFICATIONS: (This note is used to comply with regulatory documentation for home oxygen)  Patient Saturations on Room Air at Rest = 87%  Patient Saturations on Room Air while Ambulating = 85%  Patient Saturations on 2 Liters of oxygen while Ambulating = 91%  Please briefly explain why patient needs home oxygen: Pt desaturates when ambulating on RA as well as at night.  Leighton Roach, [place]  [phone]

## 2012-07-30 NOTE — Progress Notes (Signed)
Physical Therapy Evaluation Patient Details Name: [name] MRN: [id number] DOB: [birth year] Today's Date: 07/30/2012 Time: [id number] PT Time Calculation (min): 33 min  PT Assessment / Plan / Recommendation History of Present Illness  Pt with h/o HIV, presents with CAP.  Clinical Impression  Pt presents with decreased tolerance for activity and desaturation when on RA, see vitals below. She is mod I with her activity, no acute PT recommended at this time. However, she has continued pain and weakness of the left hip since surgery a year ago, recommend f/u with outpt PT for hip strengthening. She reports that she will not be able to do this currently due to finances but rec it as soon as able. PT signing off.    PT Assessment  All further PT needs can be met in the next venue of care    Follow Up Recommendations  Outpatient PT    Does the patient have the potential to tolerate intense rehabilitation      Barriers to Discharge        Equipment Recommendations  Other (comment) (O2)    Recommendations for Other Services     Frequency      Precautions / Restrictions Precautions Precautions: None Restrictions Weight Bearing Restrictions: No   Pertinent Vitals/Pain O2 sat at rest on RA 87%             With amb on RA 85%             With amb on 2L O2  91%      Mobility  Bed Mobility Bed Mobility: Supine to Sit;Sit to Supine Supine to Sit: 7: Independent Sit to Supine: 7: Independent Transfers Transfers: Sit to Stand;Stand to Sit;Stand Pivot Transfers Sit to Stand: 7: Independent Stand to Sit: 7: Independent Stand Pivot Transfers: 7: Independent Ambulation/Gait Ambulation/Gait Assistance: 6: Modified independent (Device/Increase time) Ambulation Distance (Feet): 70 Feet (35', 35') Assistive device: Rolling walker Ambulation/Gait Assistance Details: pt reliant on RW due to left hip dysfunction/ pain. Decreased WB'ing LLE but no LOB or assistance needed Gait  Pattern: Decreased stance time - left;Decreased step length - left;Decreased weight shift to left Gait velocity: decreased Stairs: No Wheelchair Mobility Wheelchair Mobility: No    Exercises     PT Diagnosis: Abnormality of gait  PT Problem List: Decreased strength;Decreased range of motion;Decreased activity tolerance;Decreased mobility;Pain PT Treatment Interventions:       PT Goals(Current goals can be found in the care plan section) Acute Rehab PT Goals Patient Stated Goal: return home PT Goal Formulation: No goals set, d/c therapy  Visit Information  Last PT Received On: 07/30/12 Assistance Needed: +1 History of Present Illness: Pt with h/o HIV, presents with CAP.       Prior [place] expects to be discharged to:: Private residence Living Arrangements: Parent Available Help at Discharge: Family Type of Home: House Home Access: Stairs to enter Technical brewer of Steps: 2 Entrance Stairs-Rails: Right Home Layout: One level Home Equipment: Environmental consultant - 2 wheels;Cane - single point;Wheelchair - manual Prior Function Level of Independence: Independent with assistive device(s) Comments: pt drives self to appts and store and gets a scooter to do grocery shopping. Does her own cooking while sitting in w/c. Ambulates short distances (100') with RW. Communication Communication: No difficulties    Cognition  Cognition Arousal/Alertness: Awake/alert Behavior During Therapy: WFL for tasks assessed/performed Overall Cognitive Status: Within Functional Limits for tasks assessed    Extremity/Trunk Assessment Upper Extremity Assessment Upper Extremity  Assessment: Overall WFL for tasks assessed Lower Extremity Assessment Lower Extremity Assessment: LLE deficits/detail LLE Deficits / Details: ORIF left hip a yr ago, hip flex 3/5, knee ext 4-/5, knee flex 4-/5, chronic pain left hip Cervical / Trunk Assessment Cervical / Trunk Assessment:  Normal   Balance Balance Balance Assessed: Yes Dynamic Standing Balance Dynamic Standing - Balance Support: No upper extremity supported;During functional activity Dynamic Standing - Level of Assistance: 6: Modified independent (Device/Increase time)  End of Session PT - End of Session Equipment Utilized During Treatment: Oxygen Activity Tolerance: Patient limited by fatigue Patient left: in bed;with call bell/phone within reach Nurse Communication: Mobility status  GP   Leighton Roach, [place]  [place], [place] 07/30/2012, 3:23 PM

## 2012-07-31 LAB — BASIC METABOLIC PANEL
CO2: 27 mEq/L (ref 19–32)
Calcium: 9.9 mg/dL (ref 8.4–10.5)
Creatinine, Ser: 1.12 mg/dL — ABNORMAL HIGH (ref 0.50–1.10)
GFR calc non Af Amer: 57 mL/min — ABNORMAL LOW (ref >90)
Glucose, Bld: 319 mg/dL — ABNORMAL HIGH (ref 70–99)
Sodium: 131 mEq/L — ABNORMAL LOW (ref 135–145)

## 2012-07-31 MED ORDER — NICOTINE 21 MG/24HR TD PT24: 1.0000 | MEDICATED_PATCH | TRANSDERMAL | Status: DC

## 2012-07-31 MED ORDER — NICOTINE 14 MG/24HR TD PT24: 1.0000 | MEDICATED_PATCH | TRANSDERMAL | Status: DC

## 2012-07-31 MED ORDER — PREDNISONE 10 MG PO TABS: 40.0000 mg | ORAL_TABLET | Freq: Every day | ORAL | Status: DC

## 2012-07-31 MED ORDER — LEVOFLOXACIN 750 MG PO TABS: 750.0000 mg | ORAL_TABLET | Freq: Every day | ORAL | Status: DC

## 2012-07-31 MED ORDER — FUROSEMIDE 20 MG PO TABS: 20.0000 mg | ORAL_TABLET | Freq: Every day | ORAL | Status: DC

## 2012-07-31 MED ORDER — POTASSIUM CHLORIDE ER 10 MEQ PO TBCR: 10.0000 meq | EXTENDED_RELEASE_TABLET | Freq: Every day | ORAL | Status: DC

## 2012-07-31 MED ORDER — DIPHENHYDRAMINE HCL 25 MG PO CAPS
25.0000 mg | ORAL_CAPSULE | Freq: Once | ORAL | Status: AC
  Administered 2012-07-31: 25 mg via ORAL
  Filled 2012-07-31: qty 1

## 2012-07-31 NOTE — Discharge Summary (Signed)
Physician Discharge Summary  [name] [id number] DOB: Aug 14, 1965 DOA: 07/25/2012  PCP: Elbert Ewings, FNP  Admit date: 07/25/2012 Discharge date: 07/31/2012  Time spent: 60 minutes  Recommendations for Outpatient Follow-up:  PCP Follow up:    Check bmet - patient has a tendency to have low sodium (down to 121 in hospital)  and high potassium.  We have restricted her fluids (to 2 liters or less per day), and started her on low dose lasix with a minimal potassium supplement.  HCTZ discontinued.  Follow CBGs - these have run high in the hospital despite being on large doses of insulin.  They should improve as she finishes her prednisone taper.  Supplemental oxygen - the patient is being discharge with oxygen via nasal canula - please monitor o2 sats and COPD.   Stop smoking!  Please arrange for outpatient PFT's  Discharge Diagnoses:  Active Problems:   HIV DISEASE   DIABETES MELLITUS, TYPE II   HYPERLIPIDEMIA   DEPRESSION   HYPERTENSION   Tobacco use disorder   Hyponatremia   CAP (community acquired pneumonia)   Suspected COPD  Discharge Condition: Much improved.  Still with minimal wheeze-but significantly better than the past few days. Great air entry bilaterally.  Diet recommendation: carb modified  Filed Weights   07/25/12 2100  Weight: 121.1 kg (266 lb 15.6 oz)    History of present illness at the time of admission:  [name] is an 47 y.o. female HIV positive on Atripla, asthma on steroid inhaler and as needed albuterol, DM2, HTN, depression and anxiety, presented to the ER with several days of coughs, shortness of breath and wheezing. She had subjective fever and chills. Evaluation in the ER included a CT which showed patchy infiltrates, and no PE. She has a WBC of 17.8K with Hb of 10.9 g/DL, and normal renal fx tests, and Na 21f 126. She was given nebs, steroids, IV Rocephin and Zithromax, and hospitalist was asked to admit her as she still was having  significant wheezing. She also was noted to be hypoxemic.  Hospital Course:  CAP  -Now with minimal wheeze in lung bases.  -Patchy infiltrates on the CT scan consistent with pneumonia. She was afebrile. Leukocytosis resolved. -Although infiltrates are atypical, last CD4 count is 560 she has very low risk to develop PCP.  -Continue management for community-acquired pneumonia with Levaquin on discharge -Supportive management with antitussives, mucolytics, steroid taper, bronchodilators, and oxygen as needed.   -Blood cultures on 7/25 neg  Asthma/COPD with Exacerbation -Treated with nebulizers, steroids and supportive care. -She became hypoxic on 7/30 when attempting to ambulate without oxygen -Will be discharged with oxygen via nasal canula. -Strongly advised to quit smoking.  Will be discharged with nicotine patches. -CTA Chest neg for Pul Embolism -will need outpatient PFT's  Hyponatremia  -Acute on Chronic.  Improved from 121 to 131 inpatient. -Likely secondary to PnA in addition to psychotropic medications  -Urine Osmols 450  -Urine Sodium 47  - on-going fluid restriction, low dose lasix and potassium supplements.   - Will need on-going monitoring outpatient.   HIV disease  -Seems to be controlled, continue ART medications.  -CD4 count in April was 560.  -patient has been asked to follow up with ID clinic at her next appointment  Hypertension  -Continue preadmission oral medications except HCTZ as it lowers sodium.  Diabetes mellitus type 2  -Hgb A1C is 9.0 on 7/30 -She received increased doses of insulin in house - as cbgs were  in the 300s -Continue glipizide, metformin, Lantus and novolog -Expect high CBGs with steroids on board.   -She will need close outpatient follow up.  Consultations:  Diabetes Coordinator  Discharge Exam: Filed Vitals:   07/31/12 0518 07/31/12 0700 07/31/12 0954 07/31/12 1022  BP: 169/106 124/88  115/78  Pulse: 82 83  94  Temp: 97.4 F  (36.3 C)     TempSrc: Oral     Resp: 20     Height:      Weight:      SpO2: 100%  94%    General: Alert and awake, oriented x3, on oxygen via n/c NAD HEENT: anicteric sclera, pupils reactive to light and accommodation, EOMI  CVS: S1-S2 clear, no murmur rubs or gallops  Chest: Minimal bilateral wheeze. No accessory muscle movement.  Abdomen: obese, soft nontender, nondistended, normal bowel sounds, no organomegaly  Extremities: no cyanosis, clubbing.  Minimal edema bilaterally. Neuro: Cranial nerves II-XII intact, no focal neurological deficits    Discharge Instructions      Discharge Orders   Future Orders Complete By Expires     Diet Carb Modified  As directed     Comments:      With a 2 liter fluid restriction    Increase activity slowly  As directed         Medication List    STOP taking these medications       diphenoxylate-atropine 2.5-0.025 MG per tablet  Commonly known as:  LOMOTIL     hydrochlorothiazide 25 MG tablet  Commonly known as:  HYDRODIURIL      TAKE these medications       albuterol (2.5 MG/3ML) 0.083% nebulizer solution  Commonly known as:  PROVENTIL  Take 2.5 mg by nebulization every 6 (six) hours as needed for wheezing. For wheezing or shortness of breath     atenolol 25 MG tablet  Commonly known as:  TENORMIN  Take 25 mg by mouth every morning.     budesonide-formoterol 80-4.5 MCG/ACT inhaler  Commonly known as:  SYMBICORT  Inhale 2 puffs into the lungs 2 (two) times daily.     carbamazepine 200 MG 12 hr tablet  Commonly known as:  TEGRETOL XR  Take 600 mg by mouth 2 (two) times daily.     dextromethorphan-guaiFENesin 30-600 MG per 12 hr tablet  Commonly known as:  MUCINEX DM  Take 1 tablet by mouth 2 (two) times daily as needed (congestion).     diazepam 5 MG tablet  Commonly known as:  VALIUM  Take 0.5-1 tablets (2.5-5 mg total) by mouth at bedtime as needed for anxiety or sleep (7.5 MG DAILY  --TAKES 5MG  EVERY NIGHT AND 2.5MG   DURING DAY IF NEEDED).     efavirenz-emtricitabine-tenofovir 600-200-300 MG per tablet  Commonly known as:  ATRIPLA  Take 1 tablet by mouth at bedtime.     estradiol 2 MG tablet  Commonly known as:  ESTRACE  Take 1 tablet (2 mg total) by mouth daily.     fish oil-omega-3 fatty acids 1000 MG capsule  Take 1 g by mouth 2 (two) times daily.     fluconazole 100 MG tablet  Commonly known as:  DIFLUCAN  Take 100 mg by mouth daily.     furosemide 20 MG tablet  Commonly known as:  LASIX  Take 1 tablet (20 mg total) by mouth daily. Helps sodium level.  Take with potassium daily.     gabapentin 300 MG capsule  Commonly known as:  NEURONTIN  Take 600 mg by mouth 3 (three) times daily.     gemfibrozil 600 MG tablet  Commonly known as:  LOPID  Take 600 mg by mouth 2 (two) times daily before a meal. 1/2 hour before meals     glipiZIDE 10 MG tablet  Commonly known as:  GLUCOTROL  Take 10 mg by mouth 2 (two) times daily before a meal.     insulin aspart 100 UNIT/ML injection  Commonly known as:  novoLOG  Inject 25 Units into the skin 3 (three) times daily before meals. Pt takes 15 units at each meals but sometimes may need more.     insulin glargine 100 UNIT/ML injection  Commonly known as:  LANTUS  Inject 90 Units into the skin at bedtime.     levofloxacin 750 MG tablet  Commonly known as:  LEVAQUIN  Take 1 tablet (750 mg total) by mouth at bedtime.     metFORMIN 1000 MG tablet  Commonly known as:  GLUCOPHAGE  Take 1,000 mg by mouth 2 (two) times daily with a meal.     methocarbamol 500 MG tablet  Commonly known as:  ROBAXIN  Take 1 tablet (500 mg total) by mouth every 6 (six) hours as needed. Every 6-8 hours as needed for muscle spasms     mometasone 50 MCG/ACT nasal spray  Commonly known as:  NASONEX  Place 2 sprays into the nose daily.     multivitamin with minerals tablet  Take 1 tablet by mouth daily.     nicotine 21 mg/24hr patch  Commonly known as:  NICODERM CQ -  dosed in mg/24 hours  Place 1 patch onto the skin daily.     nicotine 14 mg/24hr patch  Commonly known as:  [employer] NICOTINE TRANSDERMAL SYS  Place 1 patch onto the skin daily.     oxcarbazepine 600 MG tablet  Commonly known as:  TRILEPTAL  Take 600 mg by mouth daily.     oxycodone 5 MG capsule  Commonly known as:  OXY-IR  Take 5-10 mg by mouth every 3 (three) hours as needed for pain.     pantoprazole 40 MG tablet  Commonly known as:  PROTONIX  Take 40 mg by mouth every morning.     potassium chloride 10 MEQ tablet  Commonly known as:  K-DUR  Take 1 tablet (10 mEq total) by mouth daily. Take with lasix / furosemide     predniSONE 10 MG tablet  Commonly known as:  DELTASONE  - Take 4 tablets (40 mg total) by mouth daily with breakfast. Take four tablets on 8/1  - Take 3 tablets on 8/2  - Take 2 tablets on 8/3  - Take 1 tablet on 8/4 and stop.     risperidone 4 MG tablet  Commonly known as:  RISPERDAL  Take 4 mg by mouth daily.     traMADol 50 MG tablet  Commonly known as:  ULTRAM  Take 50 mg by mouth every 6 (six) hours as needed for pain.     vitamin E 1000 UNIT capsule  Generic drug:  vitamin E  Take 1,000 Units by mouth daily.       No Known Allergies Follow-up Information   Follow up with Lantana, FNP. Schedule an appointment as soon as possible for a visit in 1 week.   Contact information:   [address], [place], [place] [address] [phone]       The results of significant diagnostics from this  hospitalization (including imaging, microbiology, ancillary and laboratory) are listed below for reference.    Significant Diagnostic Studies: Ct Angio Chest Pe W/cm &/or Wo Cm  07/25/2012   *RADIOLOGY REPORT*  Clinical Data: Increasing chest pain and shortness of breath for 3 days.  CT ANGIOGRAPHY CHEST  Technique:  Multidetector CT imaging of the chest using the standard protocol during bolus administration of intravenous contrast. Multiplanar  reconstructed images including MIPs were obtained and reviewed to evaluate the vascular anatomy.  Contrast: 161mL OMNIPAQUE IOHEXOL 350 MG/ML SOLN  Comparison: Chest x-ray dated 07/25/2012  Findings: There are no pulmonary emboli.  There are multiple small patchy areas of infiltrate bilaterally.  No effusions.  Heart size is at the upper limits of normal.  Pulmonary vascularity is normal. There are a few small lymph nodes in the mediastinum.  No acute osseous abnormality.  Accentuation of the thoracic kyphosis.  IMPRESSION:  1.  No pulmonary emboli. 2.  Multiple small patchy areas of infiltrate in both lungs.  The possibility of opportunistic infection should be considered in this patient with HIV disease.   Original Report Authenticated By: Lorriane Shire, M.D.   Dg Chest Port 1 View  07/25/2012   *RADIOLOGY REPORT*  Clinical Data: Shortness of breath with chest pain and cough for 3 days.  Asthma.  Smoker.  PORTABLE CHEST - 1 VIEW  Comparison: 01/01/2012  Findings: Mildly degraded exam due to AP portable technique and patient body habitus.  There are also multiple leads and wires projecting over the chest. Mild convex right thoracic spine curvature.  Heart size upper normal, accentuated by AP portable technique. No pleural effusion or pneumothorax.  No congestive failure.  Mildly low lung volumes. Clear lungs.  IMPRESSION: Borderline cardiomegaly, without acute disease.   Original Report Authenticated By: Abigail Miyamoto, M.D.    Microbiology: Recent Results (from the past 240 hour(s))  CULTURE, BLOOD (ROUTINE X 2)     Status: None   Collection Time    07/25/12  7:02 PM      Result Value Range Status   Specimen Description BLOOD FOREARM RIGHT   Final   Special Requests BOTTLES DRAWN AEROBIC ONLY 8CC   Final   Culture  Setup Time 07/26/2012 07:29   Final   Culture     Final   Value:        BLOOD CULTURE RECEIVED NO GROWTH TO DATE CULTURE WILL BE HELD FOR 5 DAYS BEFORE ISSUING A FINAL NEGATIVE REPORT    Report Status PENDING   Incomplete  CULTURE, BLOOD (ROUTINE X 2)     Status: None   Collection Time    07/25/12  7:10 PM      Result Value Range Status   Specimen Description BLOOD HAND RIGHT   Final   Special Requests BOTTLES DRAWN AEROBIC ONLY [facility]   Final   Culture  Setup Time 07/26/2012 07:29   Final   Culture     Final   Value:        BLOOD CULTURE RECEIVED NO GROWTH TO DATE CULTURE WILL BE HELD FOR 5 DAYS BEFORE ISSUING A FINAL NEGATIVE REPORT   Report Status PENDING   Incomplete  PNEUMOCYSTIS JIROVECI SMEAR BY DFA     Status: None   Collection Time    07/26/12  7:07 AM      Result Value Range Status   Specimen Source-PJSRC SPUTUM   Final   Pneumocystis jiroveci Ag NEGATIVE   Final   Comment: Performed at [facility]  CLOSTRIDIUM DIFFICILE BY PCR     Status: None   Collection Time    07/28/12  2:54 AM      Result Value Range Status   C difficile by pcr NEGATIVE  NEGATIVE Final     Labs: Basic Metabolic Panel:  Recent Labs Lab 07/27/12 0637 07/28/12 0515 07/29/12 0527 07/30/12 0635 07/31/12 0600  NA 121* 121* 121* 125* 131*  K 5.0 5.0 4.8 4.7 4.3  CL 83* 81* 83* 85* 87*  CO2 25 25 25 27 27   GLUCOSE 302* 274* 332* 379* 319*  BUN 24* 32* 25* 30* 35*  CREATININE 1.01 1.20* 0.95 1.10 1.12*  CALCIUM 9.7 9.8 9.6 10.0 9.9   Liver Function Tests:  Recent Labs Lab 07/29/12 0527  AST 14  ALT 13  ALKPHOS 93  BILITOT 0.1*  PROT 7.8  ALBUMIN 3.0*   CBC:  Recent Labs Lab 07/25/12 1538 07/25/12 1542 07/27/12 0637 07/28/12 0515 07/29/12 0527 07/30/12 0635  WBC 17.8*  --  19.2* 18.6* 13.3* 10.9*  NEUTROABS 16.4*  --   --   --   --   --   HGB 10.9* 12.6 10.3* 10.5* 10.0* 10.0*  HCT 33.2* 37.0 31.8* 31.2* 30.6* 30.2*  MCV 84.9  --  85.5 84.8 84.3 83.9  PLT 387  --  408* 415* 360 360   Cardiac Enzymes:  Recent Labs Lab 07/25/12 1538  TROPONINI <0.30   BNP: BNP (last 3 results)  Recent Labs  07/25/12 1538  PROBNP 68.4    CBG:  Recent Labs Lab 07/30/12 0809 07/30/12 1243 07/30/12 1654 07/30/12 2124 07/31/12 0804  GLUCAP 343* 131* 130* 173* 232*     Signed:  Karen Kitchens [facility] 07/31/2012, 11:06 AM   Attending Patient seen and examined,agree with the assessment and plan as outlined above. She is significantly better-very little rhonchi on exam today. She is stable for discharge, Na has improved as well. Suspect she may have COPD rather than Asthma-she will need PFT's when this exacerbation has completely resolved. For now, she will require O2. She is stable for discharge with close outpatient follow up Extensive Tobacco counseling has been done with this patient today-especially since she will be on O2 now.  [address] [place]

## 2012-07-31 NOTE — Progress Notes (Signed)
Patient discharge teaching given, including activity, diet, follow-up appoints, and medications. Patient verbalized understanding of all discharge instructions. IV access was d/c'd. Vitals are stable. Skin is intact except as charted in most recent assessments. Pt to be escorted out by NT, to be driven home by family. 

## 2012-08-01 LAB — CULTURE, BLOOD (ROUTINE X 2): Culture: NO GROWTH

## 2012-08-27 ENCOUNTER — Telehealth: Payer: Self-pay | Admitting: *Deleted

## 2012-08-27 NOTE — Telephone Encounter (Signed)
Patient called and advised she would like for Dr Johnnye Sima to prescribe her Lyrica as the Gabapentin is not working for her any longer. She advised that Dr Rowe Robert says that her neuropathy is due to her HIV and he has reached the limit on the dose of Gabapentin and can not prescribe Lyrica. Advised the patient will have to run it by Dr Johnnye Sima and call her back. She also advised she needs to see Dr Johnnye Sima and his next available is October and she can not wait that long she expressed she is in a lot of pain.

## 2012-08-28 ENCOUNTER — Other Ambulatory Visit: Payer: Self-pay | Admitting: *Deleted

## 2012-08-28 ENCOUNTER — Encounter: Payer: Self-pay | Admitting: *Deleted

## 2012-08-28 DIAGNOSIS — G609 Hereditary and idiopathic neuropathy, unspecified: Secondary | ICD-10-CM

## 2012-08-28 MED ORDER — DULOXETINE HCL 30 MG PO CPEP: 30.0000 mg | ORAL_CAPSULE | Freq: Every day | ORAL | Status: DC

## 2012-09-08 ENCOUNTER — Other Ambulatory Visit: Payer: Self-pay | Admitting: *Deleted

## 2012-09-08 DIAGNOSIS — B20 Human immunodeficiency virus [HIV] disease: Secondary | ICD-10-CM

## 2012-09-08 MED ORDER — EFAVIRENZ-EMTRICITAB-TENOFOVIR 600-200-300 MG PO TABS: 1.0000 | ORAL_TABLET | Freq: Every day | ORAL | Status: DC

## 2012-10-08 ENCOUNTER — Other Ambulatory Visit: Payer: Self-pay | Admitting: *Deleted

## 2012-10-08 DIAGNOSIS — B20 Human immunodeficiency virus [HIV] disease: Secondary | ICD-10-CM

## 2012-10-08 MED ORDER — EFAVIRENZ-EMTRICITAB-TENOFOVIR 600-200-300 MG PO TABS: 1.0000 | ORAL_TABLET | Freq: Every day | ORAL | Status: DC

## 2012-10-29 ENCOUNTER — Other Ambulatory Visit: Payer: Self-pay | Admitting: *Deleted

## 2012-10-29 DIAGNOSIS — B20 Human immunodeficiency virus [HIV] disease: Secondary | ICD-10-CM

## 2012-10-29 MED ORDER — EFAVIRENZ-EMTRICITAB-TENOFOVIR 600-200-300 MG PO TABS: 1.0000 | ORAL_TABLET | Freq: Every day | ORAL | Status: DC

## 2012-11-06 ENCOUNTER — Other Ambulatory Visit: Payer: Self-pay

## 2012-12-08 ENCOUNTER — Other Ambulatory Visit: Payer: No Typology Code available for payment source

## 2012-12-22 ENCOUNTER — Other Ambulatory Visit: Payer: No Typology Code available for payment source

## 2013-01-06 ENCOUNTER — Ambulatory Visit (INDEPENDENT_AMBULATORY_CARE_PROVIDER_SITE_OTHER): Payer: Medicare HMO | Admitting: Infectious Diseases

## 2013-01-06 ENCOUNTER — Encounter: Payer: Self-pay | Admitting: Infectious Diseases

## 2013-01-06 VITALS — BP 128/78 | HR 96 | Temp 97.4°F | Wt 257.0 lb

## 2013-01-06 DIAGNOSIS — Z23 Encounter for immunization: Secondary | ICD-10-CM

## 2013-01-06 DIAGNOSIS — S72002S Fracture of unspecified part of neck of left femur, sequela: Secondary | ICD-10-CM

## 2013-01-06 DIAGNOSIS — Z79899 Other long term (current) drug therapy: Secondary | ICD-10-CM

## 2013-01-06 DIAGNOSIS — F329 Major depressive disorder, single episode, unspecified: Secondary | ICD-10-CM

## 2013-01-06 DIAGNOSIS — S72009S Fracture of unspecified part of neck of unspecified femur, sequela: Secondary | ICD-10-CM

## 2013-01-06 DIAGNOSIS — B20 Human immunodeficiency virus [HIV] disease: Secondary | ICD-10-CM

## 2013-01-06 DIAGNOSIS — E119 Type 2 diabetes mellitus without complications: Secondary | ICD-10-CM

## 2013-01-06 DIAGNOSIS — E785 Hyperlipidemia, unspecified: Secondary | ICD-10-CM

## 2013-01-06 DIAGNOSIS — Z113 Encounter for screening for infections with a predominantly sexual mode of transmission: Secondary | ICD-10-CM

## 2013-01-06 DIAGNOSIS — F3289 Other specified depressive episodes: Secondary | ICD-10-CM

## 2013-01-06 LAB — CBC
HCT: 35.8 % — ABNORMAL LOW (ref 36.0–46.0)
Hemoglobin: 11.5 g/dL — ABNORMAL LOW (ref 12.0–15.0)
MCH: 25.7 pg — AB (ref 26.0–34.0)
MCHC: 32.1 g/dL (ref 30.0–36.0)
MCV: 80.1 fL (ref 78.0–100.0)
PLATELETS: 385 10*3/uL (ref 150–400)
RBC: 4.47 MIL/uL (ref 3.87–5.11)
RDW: 16.9 % — AB (ref 11.5–15.5)
WBC: 9.4 10*3/uL (ref 4.0–10.5)

## 2013-01-06 MED ORDER — DIAZEPAM 5 MG PO TABS: 2.5000 mg | ORAL_TABLET | Freq: Every evening | ORAL | Status: DC | PRN

## 2013-01-06 MED ORDER — ATORVASTATIN CALCIUM 10 MG PO TABS: 10.0000 mg | ORAL_TABLET | Freq: Every day | ORAL | Status: DC

## 2013-01-06 MED ORDER — FENOFIBRATE 145 MG PO TABS: 145.0000 mg | ORAL_TABLET | Freq: Every day | ORAL | Status: DC

## 2013-01-06 NOTE — Assessment & Plan Note (Signed)
Had ophtho last year. Will cont to f/u with pcp.

## 2013-01-06 NOTE — Assessment & Plan Note (Signed)
Complicated by her extensive psych meds and need for lipid medicaitons. She appears to be doing well despite this. Could consider changing her atripla to TRV/ISN to improve lipids if changing her lopid does not work. Offered refused condoms. Flu shot today. pnvx up to date. rtc 3 months

## 2013-01-06 NOTE — Assessment & Plan Note (Signed)
Will change her lopid to fenofibrate/lipitor.

## 2013-01-06 NOTE — Progress Notes (Signed)
Subjective:    Patient ID: [name], female    DOB: 1965-02-23, 48 y.o.   MRN: [id number]  HPI 48 yo F with HIV+ since 2009, DM2 with retinopathy + neuropathy, hyperlipidemia, obesity. Also with mild Cr elevation (1.12 [07-31-12]). Taking atripla (only ART).  Trying to get disability based on her HIV, neuropathy (has constant pain in her hands), DM, asthma.  Was in hospital 07-16-11 to 07-21-11 with L hip fracture and pinning and screws after falling (Dr Maureen Ralphs).  She underwent revision of this 04-10-12 due to pain.  Was in hospital in July 2014 with CAP/asthma.  Still has some hip pain. FSGs have been ~135 in the AM and during the day. ~175 at hs.   Has constant diarrhea (has lost a few pounds, actively trying). At previous visit got lomotil (which helped) and fluyzaq (not filled due to $).  Pruritis (taking benadryl which helps some except for drowsiness).   Did not have labs 2 weeks ago, had viral illness and couldn't come.  Would like valium refill. cymbalta did not help with her neuropathy.  Is a grandma now! HIV 1 RNA Quant (copies/mL)  Date Value  04/22/2012 <20   07/30/2011 <20   01/03/2011 70*     CD4 T Cell Abs (cmm)  Date Value  04/22/2012 560   12/28/2011 30*  07/30/2011 430    Lab Results  Component Value Date   CHOL 246* 04/22/2012   HDL 47 04/22/2012   LDLCALC Comment:   Not calculated due to Triglyceride >400. Suggest ordering Direct LDL (Unit Code: [phone]).   Total Cholesterol/HDL Ratio:CHD Risk                        Coronary Heart Disease Risk Table                                        Men       Women          1/2 Average Risk              3.4        3.3              Average Risk              5.0        4.4           2X Average Risk              9.6        7.1           3X Average Risk             23.4       11.0 Use the calculated Patient Ratio above and the CHD Risk table  to determine the patient's CHD Risk. ATP III Classification (LDL):       < 100        mg/dL          Optimal      100 - 129     mg/dL         Near or Above Optimal      130 - 159     mg/dL         Borderline High      160 - 189  mg/dL         High       > 190        mg/dL         Very High   04/22/2012   TRIG 540* 04/22/2012   CHOLHDL 5.2 04/22/2012     Review of Systems  Constitutional:       Please see HPI for complete ROS       Objective:   Physical Exam  Constitutional: She appears well-developed and well-nourished.  HENT:  Mouth/Throat: No oropharyngeal exudate.  Eyes: EOM are normal. Pupils are equal, round, and reactive to light.  Neck: Neck supple.  Cardiovascular: Normal rate, regular rhythm and normal heart sounds.   Pulmonary/Chest: Effort normal and breath sounds normal.  Abdominal: Soft. Bowel sounds are normal. She exhibits no distension. There is no tenderness.  Lymphadenopathy:    She has no cervical adenopathy.          Assessment & Plan:

## 2013-01-06 NOTE — Assessment & Plan Note (Signed)
Appears to be doing ok. Will continue to f/uw ith Dr Maureen Ralphs.

## 2013-01-06 NOTE — Assessment & Plan Note (Signed)
Will refill her valium.

## 2013-01-07 ENCOUNTER — Other Ambulatory Visit: Payer: Self-pay | Admitting: *Deleted

## 2013-01-07 DIAGNOSIS — R197 Diarrhea, unspecified: Secondary | ICD-10-CM

## 2013-01-07 LAB — LIPID PANEL
Cholesterol: 247 mg/dL — ABNORMAL HIGH (ref 0–200)
HDL: 44 mg/dL (ref >39)
Total CHOL/HDL Ratio: 5.6 Ratio
Triglycerides: 702 mg/dL — ABNORMAL HIGH (ref <150)

## 2013-01-07 LAB — COMPREHENSIVE METABOLIC PANEL
ALBUMIN: 3.7 g/dL (ref 3.5–5.2)
ALT: <8 U/L (ref 0–35)
AST: 8 U/L (ref 0–37)
Alkaline Phosphatase: 76 U/L (ref 39–117)
BUN: 31 mg/dL — ABNORMAL HIGH (ref 6–23)
CO2: 21 meq/L (ref 19–32)
Calcium: 9 mg/dL (ref 8.4–10.5)
Chloride: 96 mEq/L (ref 96–112)
Creat: 0.95 mg/dL (ref 0.50–1.10)
GLUCOSE: 292 mg/dL — AB (ref 70–99)
POTASSIUM: 4.3 meq/L (ref 3.5–5.3)
SODIUM: 132 meq/L — AB (ref 135–145)
TOTAL PROTEIN: 6.8 g/dL (ref 6.0–8.3)
Total Bilirubin: 0.2 mg/dL — ABNORMAL LOW (ref 0.3–1.2)

## 2013-01-07 LAB — HEPATITIS B SURFACE ANTIBODY,QUALITATIVE: Hep B S Ab: NEGATIVE

## 2013-01-07 LAB — RPR

## 2013-01-07 MED ORDER — DIPHENOXYLATE-ATROPINE 2.5-0.025 MG PO TABS: 1.0000 | ORAL_TABLET | Freq: Four times a day (QID) | ORAL | Status: DC | PRN

## 2013-01-08 LAB — T-HELPER CELL (CD4) - (RCID CLINIC ONLY)
CD4 % Helper T Cell: 26 % — ABNORMAL LOW (ref 33–55)
CD4 T Cell Abs: 650 /uL (ref 400–2700)

## 2013-01-09 LAB — HIV-1 RNA QUANT-NO REFLEX-BLD

## 2013-02-20 ENCOUNTER — Other Ambulatory Visit: Payer: Self-pay | Admitting: Infectious Diseases

## 2013-02-24 ENCOUNTER — Ambulatory Visit: Payer: Medicare Other | Admitting: Internal Medicine

## 2013-02-24 ENCOUNTER — Other Ambulatory Visit: Payer: Self-pay | Admitting: Licensed Clinical Social Worker

## 2013-02-24 DIAGNOSIS — Z113 Encounter for screening for infections with a predominantly sexual mode of transmission: Secondary | ICD-10-CM

## 2013-02-24 DIAGNOSIS — E119 Type 2 diabetes mellitus without complications: Secondary | ICD-10-CM

## 2013-02-24 DIAGNOSIS — Z79899 Other long term (current) drug therapy: Secondary | ICD-10-CM

## 2013-02-24 DIAGNOSIS — F3289 Other specified depressive episodes: Secondary | ICD-10-CM

## 2013-02-24 DIAGNOSIS — B20 Human immunodeficiency virus [HIV] disease: Secondary | ICD-10-CM

## 2013-02-24 DIAGNOSIS — F329 Major depressive disorder, single episode, unspecified: Secondary | ICD-10-CM

## 2013-02-24 DIAGNOSIS — Z23 Encounter for immunization: Secondary | ICD-10-CM

## 2013-02-24 DIAGNOSIS — E785 Hyperlipidemia, unspecified: Secondary | ICD-10-CM

## 2013-02-24 MED ORDER — DIAZEPAM 5 MG PO TABS: 2.5000 mg | ORAL_TABLET | Freq: Every evening | ORAL | Status: DC | PRN

## 2013-03-16 ENCOUNTER — Other Ambulatory Visit: Payer: Self-pay | Admitting: *Deleted

## 2013-03-16 DIAGNOSIS — G609 Hereditary and idiopathic neuropathy, unspecified: Secondary | ICD-10-CM

## 2013-03-16 MED ORDER — DULOXETINE HCL 60 MG PO CPEP: 60.0000 mg | ORAL_CAPSULE | Freq: Every day | ORAL | Status: DC

## 2013-03-30 ENCOUNTER — Other Ambulatory Visit: Payer: No Typology Code available for payment source

## 2013-04-08 ENCOUNTER — Encounter: Payer: Self-pay | Admitting: *Deleted

## 2013-04-13 ENCOUNTER — Ambulatory Visit: Payer: No Typology Code available for payment source | Admitting: Infectious Diseases

## 2013-04-22 ENCOUNTER — Other Ambulatory Visit: Payer: Medicare HMO

## 2013-04-22 DIAGNOSIS — B20 Human immunodeficiency virus [HIV] disease: Secondary | ICD-10-CM

## 2013-04-22 LAB — CBC
HCT: 38.6 % (ref 36.0–46.0)
Hemoglobin: 12.4 g/dL (ref 12.0–15.0)
MCH: 25.6 pg — AB (ref 26.0–34.0)
MCHC: 32.1 g/dL (ref 30.0–36.0)
MCV: 79.8 fL (ref 78.0–100.0)
PLATELETS: 375 10*3/uL (ref 150–400)
RBC: 4.84 MIL/uL (ref 3.87–5.11)
RDW: 20 % — ABNORMAL HIGH (ref 11.5–15.5)
WBC: 9.8 10*3/uL (ref 4.0–10.5)

## 2013-04-22 LAB — COMPLETE METABOLIC PANEL WITH GFR
ALBUMIN: 3.9 g/dL (ref 3.5–5.2)
ALK PHOS: 124 U/L — AB (ref 39–117)
ALT: 9 U/L (ref 0–35)
AST: 11 U/L (ref 0–37)
BILIRUBIN TOTAL: 0.2 mg/dL (ref 0.2–1.2)
BUN: 20 mg/dL (ref 6–23)
CO2: 23 mEq/L (ref 19–32)
Calcium: 9.2 mg/dL (ref 8.4–10.5)
Chloride: 94 mEq/L — ABNORMAL LOW (ref 96–112)
Creat: 1.93 mg/dL — ABNORMAL HIGH (ref 0.50–1.10)
GFR, Est African American: 35 mL/min — ABNORMAL LOW
GFR, Est Non African American: 30 mL/min — ABNORMAL LOW
Glucose, Bld: 268 mg/dL — ABNORMAL HIGH (ref 70–99)
POTASSIUM: 4.4 meq/L (ref 3.5–5.3)
SODIUM: 132 meq/L — AB (ref 135–145)
TOTAL PROTEIN: 7.1 g/dL (ref 6.0–8.3)

## 2013-04-23 ENCOUNTER — Other Ambulatory Visit: Payer: No Typology Code available for payment source

## 2013-04-23 ENCOUNTER — Telehealth: Payer: Self-pay | Admitting: *Deleted

## 2013-04-23 LAB — T-HELPER CELL (CD4) - (RCID CLINIC ONLY)
CD4 % Helper T Cell: 21 % — ABNORMAL LOW (ref 33–55)
CD4 T CELL ABS: 410 /uL (ref 400–2700)

## 2013-04-23 NOTE — Telephone Encounter (Signed)
Message copied by Georgena Spurling on Thu Apr 23, 2013  9:39 AM ------      Message from: HATCHER, JEFFREY C      Created: Thu Apr 23, 2013  8:24 AM       Needs repeat Cr ------

## 2013-04-23 NOTE — Telephone Encounter (Signed)
Spoke with patient and she has an appt with endocrinology today and will ask them to do a BMP. If they can not she will call this afternoon to schedule a lab appt for early next week. If they do the lab work, she will request they fax it to this clinic. [name]

## 2013-04-23 NOTE — Telephone Encounter (Signed)
Called and left patient a voicemail she needs to come in for repeat BMP due to elevated creatinine per Dr. Johnnye Sima. [name]

## 2013-04-24 LAB — HIV-1 RNA QUANT-NO REFLEX-BLD
HIV 1 RNA Quant: <20 copies/mL (ref <20)
HIV-1 RNA Quant, Log: <1.3 {Log} (ref <1.30)

## 2013-04-30 ENCOUNTER — Telehealth: Payer: Self-pay | Admitting: *Deleted

## 2013-04-30 ENCOUNTER — Telehealth: Payer: Self-pay | Admitting: Licensed Clinical Social Worker

## 2013-04-30 NOTE — Telephone Encounter (Signed)
Patient is requesting Diazepam is this ok to refill?

## 2013-04-30 NOTE — Telephone Encounter (Signed)
Per Dr. Johnnye Sima creatinine repeated at endocrinology on 04/23/13 and it was 1.34 which was an improvement. Does not need to be repeated at [facility]. [name]

## 2013-04-30 NOTE — Telephone Encounter (Signed)
Message copied by Georgena Spurling on Thu Apr 30, 2013  8:17 AM ------      Message from: HATCHER, JEFFREY C      Created: Thu Apr 23, 2013  8:24 AM       Needs repeat Cr ------

## 2013-05-01 ENCOUNTER — Other Ambulatory Visit: Payer: Self-pay | Admitting: Licensed Clinical Social Worker

## 2013-05-01 DIAGNOSIS — E119 Type 2 diabetes mellitus without complications: Secondary | ICD-10-CM

## 2013-05-01 DIAGNOSIS — B20 Human immunodeficiency virus [HIV] disease: Secondary | ICD-10-CM

## 2013-05-01 DIAGNOSIS — Z23 Encounter for immunization: Secondary | ICD-10-CM

## 2013-05-01 DIAGNOSIS — F329 Major depressive disorder, single episode, unspecified: Secondary | ICD-10-CM

## 2013-05-01 DIAGNOSIS — Z79899 Other long term (current) drug therapy: Secondary | ICD-10-CM

## 2013-05-01 DIAGNOSIS — Z113 Encounter for screening for infections with a predominantly sexual mode of transmission: Secondary | ICD-10-CM

## 2013-05-01 DIAGNOSIS — E785 Hyperlipidemia, unspecified: Secondary | ICD-10-CM

## 2013-05-01 DIAGNOSIS — F3289 Other specified depressive episodes: Secondary | ICD-10-CM

## 2013-05-01 MED ORDER — DIAZEPAM 5 MG PO TABS: 2.5000 mg | ORAL_TABLET | Freq: Every evening | ORAL | Status: DC | PRN

## 2013-05-01 NOTE — Telephone Encounter (Signed)
rx called in

## 2013-05-01 NOTE — Telephone Encounter (Signed)
Ok to refill 

## 2013-06-02 ENCOUNTER — Ambulatory Visit: Payer: Medicare HMO | Admitting: Infectious Diseases

## 2013-06-08 ENCOUNTER — Other Ambulatory Visit: Payer: Self-pay | Admitting: *Deleted

## 2013-06-08 ENCOUNTER — Encounter: Payer: Self-pay | Admitting: Infectious Diseases

## 2013-06-08 ENCOUNTER — Ambulatory Visit (INDEPENDENT_AMBULATORY_CARE_PROVIDER_SITE_OTHER): Payer: Medicare HMO | Admitting: Infectious Diseases

## 2013-06-08 VITALS — BP 120/75 | HR 101 | Temp 98.0°F | Wt 250.0 lb

## 2013-06-08 DIAGNOSIS — F3289 Other specified depressive episodes: Secondary | ICD-10-CM

## 2013-06-08 DIAGNOSIS — Z79899 Other long term (current) drug therapy: Secondary | ICD-10-CM

## 2013-06-08 DIAGNOSIS — Z113 Encounter for screening for infections with a predominantly sexual mode of transmission: Secondary | ICD-10-CM

## 2013-06-08 DIAGNOSIS — B20 Human immunodeficiency virus [HIV] disease: Secondary | ICD-10-CM

## 2013-06-08 DIAGNOSIS — E119 Type 2 diabetes mellitus without complications: Secondary | ICD-10-CM

## 2013-06-08 DIAGNOSIS — S72002A Fracture of unspecified part of neck of left femur, initial encounter for closed fracture: Secondary | ICD-10-CM

## 2013-06-08 DIAGNOSIS — E785 Hyperlipidemia, unspecified: Secondary | ICD-10-CM

## 2013-06-08 DIAGNOSIS — Z23 Encounter for immunization: Secondary | ICD-10-CM

## 2013-06-08 DIAGNOSIS — F329 Major depressive disorder, single episode, unspecified: Secondary | ICD-10-CM

## 2013-06-08 DIAGNOSIS — S72009A Fracture of unspecified part of neck of unspecified femur, initial encounter for closed fracture: Secondary | ICD-10-CM

## 2013-06-08 DIAGNOSIS — F17200 Nicotine dependence, unspecified, uncomplicated: Secondary | ICD-10-CM

## 2013-06-08 MED ORDER — DIPHENOXYLATE-ATROPINE 2.5-0.025 MG PO TABS: ORAL_TABLET | ORAL | Status: DC

## 2013-06-08 MED ORDER — DIAZEPAM 5 MG PO TABS: 2.5000 mg | ORAL_TABLET | Freq: Every evening | ORAL | Status: DC | PRN

## 2013-06-08 MED ORDER — HYDROCODONE-ACETAMINOPHEN 10-650 MG PO TABS: 1.0000 | ORAL_TABLET | Freq: Four times a day (QID) | ORAL | Status: DC | PRN

## 2013-06-08 NOTE — Assessment & Plan Note (Signed)
She is doing very well on atripla. Will continue. Need to repeat Hep B series. Needs to get PAP smear. Offered/refused condoms. Wills ee her back in 6 months.

## 2013-06-08 NOTE — Addendum Note (Signed)
Addended by: [name] A on: 06/08/2013 04:58 PM   Modules accepted: Orders

## 2013-06-08 NOTE — Assessment & Plan Note (Addendum)
She is being eval for spine surgery, pain medicine referral. Will refill her lorcet. She understands that this is a one time refill til she can be seen in pain clinic.

## 2013-06-08 NOTE — Assessment & Plan Note (Signed)
Greatly appreciate PCP and endo f/u.

## 2013-06-08 NOTE — Assessment & Plan Note (Signed)
On lipitor, fish oil, lopid. Appreciate PCP, edno f/u.

## 2013-06-08 NOTE — Assessment & Plan Note (Signed)
Encouraged to quit. 

## 2013-06-08 NOTE — Progress Notes (Signed)
   Subjective:    Patient ID: [name], female    DOB: [birth year], 48 y.o.   MRN: [id number]  HPI 48 yo F with HIV+ since 2009, DM2 with retinopathy + neuropathy, hyperlipidemia, obesity. Also with mild Cr elevation (1.12 [07-31-12]). Taking atripla (only ART).  Trying to get disability based on her HIV, neuropathy (has constant pain in her hands), DM, asthma.  Was in hospital 07-16-11 to 07-21-11 with L hip fracture and pinning and screws after falling (Dr Maureen Ralphs).  She underwent revision of this 04-10-12 due to pain.  Still having pain from this. Was in hospital in July 2014 with CAP/asthma.   Has been seeing her PCP- Has been "rough". Has been falling more. Off tegretol, glipizide, started on lyrica, byeta. States falls pre-dated her starting on lyrica.  Had MRI (05-22-13) done which showed L1-2 and L4-5 disc compression, stenosis, nerve inpingement (L5).  Needs valium and lomotil refills.  Seeing Dr Steffanie Dunn in [facility] ([facility]) states that her FSG have been much better.   HIV 1 RNA Quant (copies/mL)  Date Value  04/22/2013 <20   01/06/2013 <20   04/22/2012 <20      CD4 T Cell Abs (/uL)  Date Value  04/22/2013 410   01/06/2013 650   04/22/2012 560    Review of Systems  Constitutional: Negative for appetite change and unexpected weight change.  Gastrointestinal: Negative for diarrhea and constipation.  Genitourinary: Negative for difficulty urinating.  Musculoskeletal: Positive for arthralgias, back pain and myalgias.  Neurological: Positive for numbness.  has had ophtho this year    Objective:   Physical Exam  Constitutional: She appears well-developed and well-nourished.  HENT:  Mouth/Throat: No oropharyngeal exudate.  Eyes: EOM are normal. Pupils are equal, round, and reactive to light.  Neck: Neck supple.  Cardiovascular: Normal rate, regular rhythm and normal heart sounds.   Pulmonary/Chest: Effort normal and breath sounds normal.  Abdominal: Soft. Bowel sounds are  normal. There is no tenderness. There is no rebound.  Lymphadenopathy:    She has no cervical adenopathy.          Assessment & Plan:

## 2013-06-14 ENCOUNTER — Encounter: Payer: Self-pay | Admitting: Infectious Diseases

## 2013-06-15 ENCOUNTER — Other Ambulatory Visit: Payer: Self-pay | Admitting: Infectious Diseases

## 2013-06-15 ENCOUNTER — Other Ambulatory Visit: Payer: Self-pay | Admitting: *Deleted

## 2013-06-19 ENCOUNTER — Other Ambulatory Visit: Payer: Self-pay | Admitting: Neurosurgery

## 2013-07-08 ENCOUNTER — Ambulatory Visit: Payer: Medicare HMO

## 2013-07-08 ENCOUNTER — Telehealth: Payer: Self-pay | Admitting: *Deleted

## 2013-07-08 DIAGNOSIS — F3289 Other specified depressive episodes: Secondary | ICD-10-CM

## 2013-07-08 DIAGNOSIS — F329 Major depressive disorder, single episode, unspecified: Secondary | ICD-10-CM

## 2013-07-08 MED ORDER — DIAZEPAM 5 MG PO TABS: 2.5000 mg | ORAL_TABLET | Freq: Every evening | ORAL | Status: DC | PRN

## 2013-07-08 NOTE — Telephone Encounter (Signed)
MD please advise about refills for Valium.

## 2013-07-08 NOTE — Telephone Encounter (Signed)
She may have one refill. Elbert Ewings, FNP is listed as her primary care provider. Please ask her if she still sees him.

## 2013-07-09 ENCOUNTER — Ambulatory Visit: Payer: Medicare HMO

## 2013-07-10 ENCOUNTER — Encounter: Payer: Self-pay | Admitting: Infectious Diseases

## 2013-07-13 ENCOUNTER — Ambulatory Visit: Payer: Medicare HMO

## 2013-07-14 ENCOUNTER — Other Ambulatory Visit: Payer: Self-pay | Admitting: Infectious Diseases

## 2013-07-14 ENCOUNTER — Telehealth: Payer: Self-pay | Admitting: Licensed Clinical Social Worker

## 2013-07-14 ENCOUNTER — Ambulatory Visit (INDEPENDENT_AMBULATORY_CARE_PROVIDER_SITE_OTHER): Payer: Medicare HMO | Admitting: Licensed Clinical Social Worker

## 2013-07-14 DIAGNOSIS — Z23 Encounter for immunization: Secondary | ICD-10-CM

## 2013-07-14 DIAGNOSIS — B370 Candidal stomatitis: Secondary | ICD-10-CM

## 2013-07-14 DIAGNOSIS — B20 Human immunodeficiency virus [HIV] disease: Secondary | ICD-10-CM

## 2013-07-14 MED ORDER — FLUCONAZOLE 200 MG PO TABS: 400.0000 mg | ORAL_TABLET | Freq: Every day | ORAL | Status: DC

## 2013-07-14 MED ORDER — FLUCONAZOLE 200 MG PO TABS: 200.0000 mg | ORAL_TABLET | Freq: Every day | ORAL | Status: DC

## 2013-07-14 NOTE — Telephone Encounter (Signed)
Patient came in for her 2nd hep b vaccine and was concerned about thrush in her mouth and under her tongue. She stated that it was causing discomfort to swallow and talk. I looked in her mouth and thrush was under her tongue, surrounding the back of her throat, and on the roof of her mouth. Dr. Baxter Flattery was here in clinic today and she suggested 400 mg of diflucan daily for 10 days. I noticed afterwards that the patient had sent a message via mychart that went to Dr. Johnnye Sima and he suggested 200 mg daily and it was sent to the patient. The patient wanted to take 400 mg due to her already taking 100 mg daily for the past 3 years. Dr. Johnnye Sima recommended that if the patient does not see improvement then she would need to be seen in our office to have a culture done. Patient agreed with this plan.

## 2013-08-04 ENCOUNTER — Telehealth: Payer: Self-pay | Admitting: Internal Medicine

## 2013-08-04 DIAGNOSIS — F3289 Other specified depressive episodes: Secondary | ICD-10-CM

## 2013-08-04 DIAGNOSIS — F329 Major depressive disorder, single episode, unspecified: Secondary | ICD-10-CM

## 2013-08-05 ENCOUNTER — Encounter: Payer: Self-pay | Admitting: Infectious Diseases

## 2013-08-05 NOTE — Telephone Encounter (Signed)
Previous message from Dr. Megan Salon regarding this medication referenced the patient calling her primary care provider for refills of this medication.  Left message for the pt to call her primary care provider for refills of this medication, Dr Johnnye Sima is not available to refill rx at this time.  Requested she return call if she has questions.

## 2013-08-05 NOTE — Telephone Encounter (Signed)
Looks like he has.  #30, 5 mg no refills, 1/2 -1 one at night prn sleep.  thanks

## 2013-08-05 NOTE — Telephone Encounter (Signed)
Pt returned call re: valium rx.  Stated that Dr. Johnnye Sima has been prescribing this medication for her for some time.  EPIC shows initially started in 06/2010.  Amount of milligrams has increased over time.  MD please advise about refilling.

## 2013-08-06 MED ORDER — DIAZEPAM 5 MG PO TABS: 2.5000 mg | ORAL_TABLET | Freq: Every evening | ORAL | Status: DC | PRN

## 2013-08-06 NOTE — Addendum Note (Signed)
Addended by: Lorne Skeens D on: 08/06/2013 10:29 AM   Modules accepted: Orders

## 2013-08-16 ENCOUNTER — Encounter: Payer: Self-pay | Admitting: Infectious Diseases

## 2013-08-17 ENCOUNTER — Other Ambulatory Visit: Payer: Self-pay | Admitting: *Deleted

## 2013-08-17 DIAGNOSIS — B20 Human immunodeficiency virus [HIV] disease: Secondary | ICD-10-CM

## 2013-08-17 MED ORDER — DIPHENOXYLATE-ATROPINE 2.5-0.025 MG PO TABS: ORAL_TABLET | ORAL | Status: DC

## 2013-09-04 ENCOUNTER — Other Ambulatory Visit: Payer: Self-pay | Admitting: Physician Assistant

## 2013-09-04 DIAGNOSIS — Z1231 Encounter for screening mammogram for malignant neoplasm of breast: Secondary | ICD-10-CM

## 2013-09-07 ENCOUNTER — Other Ambulatory Visit: Payer: Self-pay | Admitting: Internal Medicine

## 2013-09-08 ENCOUNTER — Other Ambulatory Visit: Payer: Self-pay | Admitting: *Deleted

## 2013-09-08 DIAGNOSIS — F329 Major depressive disorder, single episode, unspecified: Secondary | ICD-10-CM

## 2013-09-08 DIAGNOSIS — F3289 Other specified depressive episodes: Secondary | ICD-10-CM

## 2013-09-08 MED ORDER — DIAZEPAM 5 MG PO TABS: 2.5000 mg | ORAL_TABLET | Freq: Every evening | ORAL | Status: DC | PRN

## 2013-09-16 ENCOUNTER — Ambulatory Visit (HOSPITAL_COMMUNITY): Admission: RE | Admit: 2013-09-16 | Payer: Medicare HMO | Source: Ambulatory Visit | Admitting: Neurosurgery

## 2013-09-16 ENCOUNTER — Encounter (HOSPITAL_COMMUNITY): Admission: RE | Payer: Self-pay | Source: Ambulatory Visit

## 2013-09-16 SURGERY — LUMBAR LAMINECTOMY/DECOMPRESSION MICRODISCECTOMY 2 LEVELS
Anesthesia: General

## 2013-09-30 ENCOUNTER — Other Ambulatory Visit: Payer: Self-pay | Admitting: Infectious Diseases

## 2013-10-01 ENCOUNTER — Encounter: Payer: Self-pay | Admitting: Infectious Diseases

## 2013-10-02 ENCOUNTER — Other Ambulatory Visit: Payer: Self-pay | Admitting: *Deleted

## 2013-10-02 DIAGNOSIS — F329 Major depressive disorder, single episode, unspecified: Secondary | ICD-10-CM

## 2013-10-02 DIAGNOSIS — R197 Diarrhea, unspecified: Secondary | ICD-10-CM

## 2013-10-02 DIAGNOSIS — F32A Depression, unspecified: Secondary | ICD-10-CM

## 2013-10-02 MED ORDER — DIAZEPAM 5 MG PO TABS: 2.5000 mg | ORAL_TABLET | Freq: Every evening | ORAL | Status: DC | PRN

## 2013-10-02 MED ORDER — DIPHENOXYLATE-ATROPINE 2.5-0.025 MG PO TABS: ORAL_TABLET | ORAL | Status: DC

## 2013-10-08 ENCOUNTER — Encounter: Payer: Self-pay | Admitting: *Deleted

## 2013-10-13 ENCOUNTER — Other Ambulatory Visit: Payer: Self-pay | Admitting: Infectious Diseases

## 2013-10-20 ENCOUNTER — Other Ambulatory Visit: Payer: Self-pay | Admitting: Infectious Diseases

## 2013-10-21 ENCOUNTER — Other Ambulatory Visit: Payer: Self-pay | Admitting: *Deleted

## 2013-10-21 DIAGNOSIS — B370 Candidal stomatitis: Secondary | ICD-10-CM

## 2013-10-21 MED ORDER — FLUCONAZOLE 200 MG PO TABS: 200.0000 mg | ORAL_TABLET | Freq: Every day | ORAL | Status: DC

## 2013-11-02 ENCOUNTER — Encounter: Payer: Self-pay | Admitting: Infectious Diseases

## 2013-11-03 ENCOUNTER — Other Ambulatory Visit: Payer: Self-pay | Admitting: Infectious Diseases

## 2013-11-03 ENCOUNTER — Other Ambulatory Visit: Payer: Self-pay | Admitting: Internal Medicine

## 2013-11-04 ENCOUNTER — Other Ambulatory Visit: Payer: Self-pay | Admitting: Internal Medicine

## 2013-11-04 ENCOUNTER — Other Ambulatory Visit: Payer: Self-pay | Admitting: *Deleted

## 2013-11-04 ENCOUNTER — Telehealth: Payer: Self-pay | Admitting: *Deleted

## 2013-11-04 DIAGNOSIS — F329 Major depressive disorder, single episode, unspecified: Secondary | ICD-10-CM

## 2013-11-04 DIAGNOSIS — F32A Depression, unspecified: Secondary | ICD-10-CM

## 2013-11-04 MED ORDER — DIAZEPAM 5 MG PO TABS: 2.5000 mg | ORAL_TABLET | Freq: Every evening | ORAL | Status: DC | PRN

## 2013-11-04 NOTE — Telephone Encounter (Signed)
Patient requesting change in therapy.  Per your notes on 7/10, patient was to be on 200mg  fluconazole daily, if this is ineffective she should have a culture for resistance.  This prescription was sent for 200mg  daily with 6 refills 10/21. Patient feels she should be taking 400mg  daily, sent the following message asking for renewal.  Please advise.  ___________ Original authorizing provider: Bobby Rumpf, MD         [place] would like a refill of the following medications:    diazepam (VALIUM) 5 MG tablet Bobby Rumpf, MD]        Preferred pharmacy: [id number] - [place], [place] - [address] AT [address]        Comment:    Dr. Johnnye Sima I need enough flucazanole for two weeks. The last time you only gave me enough for enough for 2 weeks which is 15 200mg . pills. I take 400mg . per day.   Thank you  [name]

## 2013-11-04 NOTE — Telephone Encounter (Signed)
Can refill what she has, otherwise needs to be seen thanks

## 2013-11-05 ENCOUNTER — Ambulatory Visit: Payer: Medicare HMO

## 2013-11-05 ENCOUNTER — Other Ambulatory Visit: Payer: Self-pay | Admitting: *Deleted

## 2013-11-05 NOTE — Telephone Encounter (Signed)
Left message asking her to refill the 200mg /daily, only take 200mg  per day; to come in for resistance testing if this is ineffective.

## 2013-11-05 NOTE — Telephone Encounter (Signed)
Let [facility] know Dr. Algis Downs response.

## 2013-11-17 ENCOUNTER — Encounter: Payer: Self-pay | Admitting: Infectious Diseases

## 2013-11-18 ENCOUNTER — Telehealth: Payer: Self-pay | Admitting: *Deleted

## 2013-11-18 ENCOUNTER — Other Ambulatory Visit: Payer: Self-pay | Admitting: Infectious Diseases

## 2013-11-18 NOTE — Telephone Encounter (Signed)
Please see the following patient email and advise: ________  Dr. Johnnye Sima,    I have had diareah for a week and the lomotol is not working. Can you please prescribe me a prescribtion of zifaxion to get rid of this problem. please        [name]

## 2013-11-18 NOTE — Telephone Encounter (Signed)
xifaxan 

## 2013-11-18 NOTE — Telephone Encounter (Signed)
Can you ask her to clarify what this Rx is? I cannot find it in epocrates

## 2013-11-18 NOTE — Telephone Encounter (Signed)
Patient called and advised she is having trouble walking and getting around her home and needs a wheel chair. She advised she wants to see her doctor to talk about the situation as it is getting worse. She advised her PCP does not know her like Dr Johnnye Sima and feels she needs to see him so that he can let her know if she needs a chair and possibly help her with direction. Gave the patient an appt for 11/20/13 to see Dr Johnnye Sima to discuss the next step.

## 2013-11-20 ENCOUNTER — Ambulatory Visit: Payer: Medicare HMO | Admitting: Infectious Diseases

## 2013-11-20 ENCOUNTER — Telehealth: Payer: Self-pay | Admitting: Infectious Diseases

## 2013-11-20 NOTE — Telephone Encounter (Signed)
Not clear why pt is on xifaxin. Called pt to clarify.

## 2013-11-24 ENCOUNTER — Emergency Department (HOSPITAL_COMMUNITY): Payer: Medicare HMO

## 2013-11-24 ENCOUNTER — Encounter (HOSPITAL_COMMUNITY): Payer: Self-pay | Admitting: Emergency Medicine

## 2013-11-24 ENCOUNTER — Emergency Department (HOSPITAL_COMMUNITY)
Admission: EM | Admit: 2013-11-24 | Discharge: 2013-11-24 | Disposition: A | Discharge status: Home / Self Care | Payer: Medicare HMO | Attending: Emergency Medicine | Admitting: Emergency Medicine

## 2013-11-24 DIAGNOSIS — Z7952 Long term (current) use of systemic steroids: Secondary | ICD-10-CM | POA: Diagnosis not present

## 2013-11-24 DIAGNOSIS — M545 Low back pain, unspecified: Secondary | ICD-10-CM

## 2013-11-24 DIAGNOSIS — Z720 Tobacco use: Secondary | ICD-10-CM | POA: Insufficient documentation

## 2013-11-24 DIAGNOSIS — S3992XA Unspecified injury of lower back, initial encounter: Secondary | ICD-10-CM | POA: Insufficient documentation

## 2013-11-24 DIAGNOSIS — Z8669 Personal history of other diseases of the nervous system and sense organs: Secondary | ICD-10-CM | POA: Insufficient documentation

## 2013-11-24 DIAGNOSIS — Y92410 Unspecified street and highway as the place of occurrence of the external cause: Secondary | ICD-10-CM | POA: Insufficient documentation

## 2013-11-24 DIAGNOSIS — Y998 Other external cause status: Secondary | ICD-10-CM | POA: Diagnosis not present

## 2013-11-24 DIAGNOSIS — F329 Major depressive disorder, single episode, unspecified: Secondary | ICD-10-CM | POA: Insufficient documentation

## 2013-11-24 DIAGNOSIS — E119 Type 2 diabetes mellitus without complications: Secondary | ICD-10-CM | POA: Diagnosis not present

## 2013-11-24 DIAGNOSIS — J45909 Unspecified asthma, uncomplicated: Secondary | ICD-10-CM | POA: Insufficient documentation

## 2013-11-24 DIAGNOSIS — Z794 Long term (current) use of insulin: Secondary | ICD-10-CM | POA: Diagnosis not present

## 2013-11-24 DIAGNOSIS — M1990 Unspecified osteoarthritis, unspecified site: Secondary | ICD-10-CM | POA: Insufficient documentation

## 2013-11-24 DIAGNOSIS — Z21 Asymptomatic human immunodeficiency virus [HIV] infection status: Secondary | ICD-10-CM | POA: Insufficient documentation

## 2013-11-24 DIAGNOSIS — Y9389 Activity, other specified: Secondary | ICD-10-CM | POA: Insufficient documentation

## 2013-11-24 DIAGNOSIS — F419 Anxiety disorder, unspecified: Secondary | ICD-10-CM | POA: Diagnosis not present

## 2013-11-24 DIAGNOSIS — S72002A Fracture of unspecified part of neck of left femur, initial encounter for closed fracture: Secondary | ICD-10-CM

## 2013-11-24 DIAGNOSIS — K219 Gastro-esophageal reflux disease without esophagitis: Secondary | ICD-10-CM | POA: Diagnosis not present

## 2013-11-24 DIAGNOSIS — Z79899 Other long term (current) drug therapy: Secondary | ICD-10-CM | POA: Diagnosis not present

## 2013-11-24 DIAGNOSIS — I10 Essential (primary) hypertension: Secondary | ICD-10-CM | POA: Insufficient documentation

## 2013-11-24 MED ORDER — HYDROCODONE-ACETAMINOPHEN 5-325 MG PO TABS: 1.0000 | ORAL_TABLET | ORAL | Status: DC | PRN

## 2013-11-24 MED ORDER — CYCLOBENZAPRINE HCL 10 MG PO TABS: 10.0000 mg | ORAL_TABLET | Freq: Two times a day (BID) | ORAL | Status: DC | PRN
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
## 2013-11-24 NOTE — Discharge Instructions (Signed)
Back Pain, Adult Low back pain is very common. About 1 in 5 people have back pain.The cause of low back pain is rarely dangerous. The pain often gets better over time.About half of people with a sudden onset of back pain feel better in just 2 weeks. About 8 in 10 people feel better by 6 weeks.  CAUSES Some common causes of back pain include:  Strain of the muscles or ligaments supporting the spine.  Wear and tear (degeneration) of the spinal discs.  Arthritis.  Direct injury to the back. DIAGNOSIS Most of the time, the direct cause of low back pain is not known.However, back pain can be treated effectively even when the exact cause of the pain is unknown.Answering your caregiver's questions about your overall health and symptoms is one of the most accurate ways to make sure the cause of your pain is not dangerous. If your caregiver needs more information, he or she may order lab work or imaging tests (X-rays or MRIs).However, even if imaging tests show changes in your back, this usually does not require surgery. HOME CARE INSTRUCTIONS For many people, back pain returns.Since low back pain is rarely dangerous, it is often a condition that people can learn to manageon their own.   Remain active. It is stressful on the back to sit or stand in one place. Do not sit, drive, or stand in one place for more than 30 minutes at a time. Take short walks on level surfaces as soon as pain allows.Try to increase the length of time you walk each day.  Do not stay in bed.Resting more than 1 or 2 days can delay your recovery.  Do not avoid exercise or work.Your body is made to move.It is not dangerous to be active, even though your back may hurt.Your back will likely heal faster if you return to being active before your pain is gone.  Pay attention to your body when you bend and lift. Many people have less discomfortwhen lifting if they bend their knees, keep the load close to their bodies,and  avoid twisting. Often, the most comfortable positions are those that put less stress on your recovering back.  Find a comfortable position to sleep. Use a firm mattress and lie on your side with your knees slightly bent. If you lie on your back, put a pillow under your knees.  Only take over-the-counter or prescription medicines as directed by your caregiver. Over-the-counter medicines to reduce pain and inflammation are often the most helpful.Your caregiver may prescribe muscle relaxant drugs.These medicines help dull your pain so you can more quickly return to your normal activities and healthy exercise.  Put ice on the injured area.  Put ice in a plastic bag.  Place a towel between your skin and the bag.  Leave the ice on for 15-20 minutes, 03-04 times a day for the first 2 to 3 days. After that, ice and heat may be alternated to reduce pain and spasms.  Ask your caregiver about trying back exercises and gentle massage. This may be of some benefit.  Avoid feeling anxious or stressed.Stress increases muscle tension and can worsen back pain.It is important to recognize when you are anxious or stressed and learn ways to manage it.Exercise is a great option. SEEK MEDICAL CARE IF:  You have pain that is not relieved with rest or medicine.  You have pain that does not improve in 1 week.  You have new symptoms.  You are generally not feeling well. SEEK   IMMEDIATE MEDICAL CARE IF:   You have pain that radiates from your back into your legs.  You develop new bowel or bladder control problems.  You have unusual weakness or numbness in your arms or legs.  You develop nausea or vomiting.  You develop abdominal pain.  You feel faint. Document Released: 12/18/2004 Document Revised: 06/19/2011 Document Reviewed: 04/21/2013 ExitCare Patient Information 2015 [employer]. This information is not intended to replace advice given to you by your health care provider. Make sure you  discuss any questions you have with your health care provider.  
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
## 2013-11-30 ENCOUNTER — Ambulatory Visit: Payer: Medicare HMO | Admitting: Infectious Diseases

## 2013-12-01 ENCOUNTER — Other Ambulatory Visit: Payer: Self-pay | Admitting: Infectious Diseases

## 2013-12-03 ENCOUNTER — Ambulatory Visit: Payer: Medicare HMO

## 2013-12-08 ENCOUNTER — Ambulatory Visit: Payer: Medicare HMO

## 2013-12-09 ENCOUNTER — Ambulatory Visit: Payer: Medicare HMO

## 2013-12-09 ENCOUNTER — Telehealth: Payer: Self-pay | Admitting: *Deleted

## 2013-12-09 ENCOUNTER — Other Ambulatory Visit: Payer: Medicare HMO

## 2013-12-09 ENCOUNTER — Other Ambulatory Visit: Payer: Self-pay | Admitting: Infectious Diseases

## 2013-12-09 DIAGNOSIS — B370 Candidal stomatitis: Secondary | ICD-10-CM

## 2013-12-09 DIAGNOSIS — E785 Hyperlipidemia, unspecified: Secondary | ICD-10-CM

## 2013-12-09 DIAGNOSIS — B20 Human immunodeficiency virus [HIV] disease: Secondary | ICD-10-CM

## 2013-12-09 LAB — COMPLETE METABOLIC PANEL WITH GFR
ALBUMIN: 3.6 g/dL (ref 3.5–5.2)
ALT: 22 U/L (ref 0–35)
AST: 23 U/L (ref 0–37)
Alkaline Phosphatase: 148 U/L — ABNORMAL HIGH (ref 39–117)
BUN: 22 mg/dL (ref 6–23)
CALCIUM: 9.3 mg/dL (ref 8.4–10.5)
CHLORIDE: 100 meq/L (ref 96–112)
CO2: 25 meq/L (ref 19–32)
Creat: 1.56 mg/dL — ABNORMAL HIGH (ref 0.50–1.10)
GFR, EST AFRICAN AMERICAN: 45 mL/min — AB
GFR, Est Non African American: 39 mL/min — ABNORMAL LOW
GLUCOSE: 102 mg/dL — AB (ref 70–99)
Potassium: 4.3 mEq/L (ref 3.5–5.3)
Sodium: 134 mEq/L — ABNORMAL LOW (ref 135–145)
Total Bilirubin: 0.3 mg/dL (ref 0.2–1.2)
Total Protein: 6.9 g/dL (ref 6.0–8.3)

## 2013-12-09 LAB — CBC WITH DIFFERENTIAL/PLATELET
BASOS ABS: 0.1 10*3/uL (ref 0.0–0.1)
Basophils Relative: 1 % (ref 0–1)
EOS PCT: 3 % (ref 0–5)
Eosinophils Absolute: 0.3 10*3/uL (ref 0.0–0.7)
HEMATOCRIT: 34.8 % — AB (ref 36.0–46.0)
Hemoglobin: 11.1 g/dL — ABNORMAL LOW (ref 12.0–15.0)
LYMPHS PCT: 25 % (ref 12–46)
Lymphs Abs: 2.6 10*3/uL (ref 0.7–4.0)
MCH: 24.9 pg — ABNORMAL LOW (ref 26.0–34.0)
MCHC: 31.9 g/dL (ref 30.0–36.0)
MCV: 78 fL (ref 78.0–100.0)
MONOS PCT: 5 % (ref 3–12)
MPV: 10.7 fL (ref 9.4–12.4)
Monocytes Absolute: 0.5 10*3/uL (ref 0.1–1.0)
Neutro Abs: 6.8 10*3/uL (ref 1.7–7.7)
Neutrophils Relative %: 66 % (ref 43–77)
PLATELETS: 342 10*3/uL (ref 150–400)
RBC: 4.46 MIL/uL (ref 3.87–5.11)
RDW: 18.8 % — ABNORMAL HIGH (ref 11.5–15.5)
WBC: 10.3 10*3/uL (ref 4.0–10.5)

## 2013-12-09 LAB — LIPID PANEL
Cholesterol: 132 mg/dL (ref 0–200)
HDL: 30 mg/dL — ABNORMAL LOW (ref >39)
LDL CALC: 37 mg/dL (ref 0–99)
Total CHOL/HDL Ratio: 4.4 Ratio
Triglycerides: 323 mg/dL — ABNORMAL HIGH (ref <150)
VLDL: 65 mg/dL — ABNORMAL HIGH (ref 0–40)

## 2013-12-09 NOTE — Telephone Encounter (Signed)
Can start pt on fluconazole 400mg  po qday for 7 days Fungal sensitivity testing- fluconazole, caspofungin

## 2013-12-09 NOTE — Telephone Encounter (Signed)
Patient at clinic for routine lab draws, states her thrush is back, wants to have it tested for resistance as per Dr. Algis Downs advice.  Please add order. (pt couldn't stay d/t transportation, will be back for this test). Patient also states she "can't stand the pain, will go back to 400mg /day diflucan to take care of this." Please advise if she should stay on 400mg /day. Landis Gandy, RN

## 2013-12-10 LAB — T-HELPER CELL (CD4) - (RCID CLINIC ONLY)
CD4 % Helper T Cell: 29 % — ABNORMAL LOW (ref 33–55)
CD4 T CELL ABS: 610 /uL (ref 400–2700)

## 2013-12-10 MED ORDER — FLUCONAZOLE 200 MG PO TABS: 400.0000 mg | ORAL_TABLET | Freq: Every day | ORAL | Status: DC

## 2013-12-10 NOTE — Telephone Encounter (Signed)
Dr. Johnnye Sima, do you want any refills for this rx?

## 2013-12-10 NOTE — Addendum Note (Signed)
Addended by: Lorne Skeens D on: 12/10/2013 11:05 AM   Modules accepted: Orders

## 2013-12-11 ENCOUNTER — Other Ambulatory Visit: Payer: Self-pay | Admitting: *Deleted

## 2013-12-11 ENCOUNTER — Other Ambulatory Visit: Payer: Self-pay | Admitting: Infectious Diseases

## 2013-12-11 DIAGNOSIS — B370 Candidal stomatitis: Secondary | ICD-10-CM

## 2013-12-11 LAB — HIV-1 RNA QUANT-NO REFLEX-BLD: HIV-1 RNA Quant, Log: <1.3 {Log} (ref <1.30)

## 2013-12-11 MED ORDER — FLUCONAZOLE 200 MG PO TABS: 400.0000 mg | ORAL_TABLET | Freq: Every day | ORAL | Status: DC

## 2013-12-11 NOTE — Telephone Encounter (Signed)
Rx sent and left patient a voice mail to please call the clinic to schedule a lab appointment for the fungal culture. [name]

## 2013-12-21 ENCOUNTER — Telehealth: Payer: Self-pay

## 2013-12-21 NOTE — Telephone Encounter (Signed)
Patient states she has appointment with Dr Johnnye Sima on Wednesday, December , 23, 2015. She has thrush present again and is requesting fluconazole 400 mg twice daily until her office visit.  She is having a terrible time with it this time and would not like to wait for office visit if possible.    Please advise.   [facility] on [address].

## 2013-12-21 NOTE — Telephone Encounter (Signed)
Ok to refill 

## 2013-12-22 ENCOUNTER — Other Ambulatory Visit: Payer: Self-pay

## 2013-12-22 DIAGNOSIS — B370 Candidal stomatitis: Secondary | ICD-10-CM

## 2013-12-22 MED ORDER — FLUCONAZOLE 200 MG PO TABS: 400.0000 mg | ORAL_TABLET | Freq: Two times a day (BID) | ORAL | Status: DC

## 2013-12-22 NOTE — Telephone Encounter (Signed)
Medication sent to pharmacy.   Laverle Patter, RN

## 2013-12-23 ENCOUNTER — Ambulatory Visit (INDEPENDENT_AMBULATORY_CARE_PROVIDER_SITE_OTHER): Payer: Medicare HMO | Admitting: *Deleted

## 2013-12-23 ENCOUNTER — Other Ambulatory Visit: Payer: Self-pay | Admitting: *Deleted

## 2013-12-23 ENCOUNTER — Ambulatory Visit (INDEPENDENT_AMBULATORY_CARE_PROVIDER_SITE_OTHER): Payer: Medicare HMO | Admitting: Infectious Diseases

## 2013-12-23 ENCOUNTER — Encounter: Payer: Self-pay | Admitting: Infectious Diseases

## 2013-12-23 ENCOUNTER — Other Ambulatory Visit: Payer: Self-pay | Admitting: Infectious Diseases

## 2013-12-23 VITALS — BP 112/73 | HR 83 | Temp 97.8°F | Wt 223.0 lb

## 2013-12-23 DIAGNOSIS — Z113 Encounter for screening for infections with a predominantly sexual mode of transmission: Secondary | ICD-10-CM

## 2013-12-23 DIAGNOSIS — IMO0002 Reserved for concepts with insufficient information to code with codable children: Secondary | ICD-10-CM

## 2013-12-23 DIAGNOSIS — B370 Candidal stomatitis: Secondary | ICD-10-CM

## 2013-12-23 DIAGNOSIS — R197 Diarrhea, unspecified: Secondary | ICD-10-CM

## 2013-12-23 DIAGNOSIS — B20 Human immunodeficiency virus [HIV] disease: Secondary | ICD-10-CM

## 2013-12-23 DIAGNOSIS — S72002S Fracture of unspecified part of neck of left femur, sequela: Secondary | ICD-10-CM

## 2013-12-23 DIAGNOSIS — Z79899 Other long term (current) drug therapy: Secondary | ICD-10-CM

## 2013-12-23 DIAGNOSIS — E1165 Type 2 diabetes mellitus with hyperglycemia: Secondary | ICD-10-CM

## 2013-12-23 DIAGNOSIS — Z23 Encounter for immunization: Secondary | ICD-10-CM

## 2013-12-23 DIAGNOSIS — E118 Type 2 diabetes mellitus with unspecified complications: Secondary | ICD-10-CM

## 2013-12-23 MED ORDER — DIPHENOXYLATE-ATROPINE 2.5-0.025 MG PO TABS: ORAL_TABLET | ORAL | Status: DC

## 2013-12-23 MED ORDER — HYDROCODONE-ACETAMINOPHEN 10-650 MG PO TABS: 1.0000 | ORAL_TABLET | Freq: Four times a day (QID) | ORAL | Status: DC | PRN

## 2013-12-23 MED ORDER — HYDROCODONE-ACETAMINOPHEN 5-325 MG PO TABS: 1.0000 | ORAL_TABLET | Freq: Four times a day (QID) | ORAL | Status: DC | PRN

## 2013-12-23 MED ORDER — FLUCONAZOLE 200 MG PO TABS: 400.0000 mg | ORAL_TABLET | Freq: Two times a day (BID) | ORAL | Status: DC

## 2013-12-23 NOTE — Assessment & Plan Note (Signed)
Completes second Hep B series today.  Doing well on atripla.  Offered/refused condoms.  Needs PAP,  Will refill lomotil.  rtc in 6 months.

## 2013-12-23 NOTE — Assessment & Plan Note (Signed)
Will refill her lomotil.

## 2013-12-23 NOTE — Assessment & Plan Note (Signed)
Will give her one time rx of percocet 10/650 #30.

## 2013-12-23 NOTE — Assessment & Plan Note (Signed)
Will send Cx and sensi. Refill her diflucan for now.

## 2013-12-23 NOTE — Assessment & Plan Note (Signed)
Is followed by PMD in [place]. Greatly appreciate their f/u. She has "lots of lows" due to eating qday. On oral glycemic agents.
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
## 2013-12-25 ENCOUNTER — Other Ambulatory Visit: Payer: Self-pay | Admitting: Infectious Diseases

## 2013-12-26 ENCOUNTER — Other Ambulatory Visit: Payer: Self-pay | Admitting: Infectious Diseases

## 2013-12-28 ENCOUNTER — Other Ambulatory Visit: Payer: Self-pay | Admitting: *Deleted

## 2013-12-28 ENCOUNTER — Encounter: Payer: Self-pay | Admitting: Infectious Diseases

## 2013-12-28 DIAGNOSIS — R197 Diarrhea, unspecified: Secondary | ICD-10-CM

## 2013-12-28 MED ORDER — DIPHENOXYLATE-ATROPINE 2.5-0.025 MG PO TABS: ORAL_TABLET | ORAL | Status: DC

## 2013-12-30 ENCOUNTER — Other Ambulatory Visit: Payer: Self-pay | Admitting: Infectious Diseases

## 2013-12-31 ENCOUNTER — Other Ambulatory Visit: Payer: Self-pay | Admitting: *Deleted

## 2013-12-31 DIAGNOSIS — B370 Candidal stomatitis: Secondary | ICD-10-CM

## 2013-12-31 MED ORDER — FLUCONAZOLE 200 MG PO TABS: 400.0000 mg | ORAL_TABLET | Freq: Two times a day (BID) | ORAL | Status: DC

## 2014-01-06 ENCOUNTER — Ambulatory Visit: Payer: Medicare HMO

## 2014-01-12 LAB — FUNGUS CULTURE W SMEAR

## 2014-01-15 ENCOUNTER — Telehealth: Payer: Self-pay | Admitting: Infectious Diseases

## 2014-01-15 DIAGNOSIS — B3781 Candidal esophagitis: Secondary | ICD-10-CM

## 2014-01-15 MED ORDER — ITRACONAZOLE 10 MG/ML PO SOLN: 100.0000 mg | Freq: Every day | ORAL | Status: DC

## 2014-01-15 NOTE — Telephone Encounter (Signed)
Called pt to let her know that her test showed that her yeast/thrush is resistant to diflucan.  Will change her rx to itraconazole.  (see med list) Left message on voice mail  [place], vicodin, valium and lipitor while on this.

## 2014-01-19 LAB — REFERRED ASSAY

## 2014-01-24 ENCOUNTER — Other Ambulatory Visit: Payer: Self-pay | Admitting: Infectious Diseases

## 2014-01-26 ENCOUNTER — Telehealth: Payer: Self-pay | Admitting: *Deleted

## 2014-01-26 NOTE — Telephone Encounter (Signed)
PA faxed for itraconazole.  Waiting for answer. Landis Gandy, RN

## 2014-01-28 NOTE — Telephone Encounter (Signed)
Medication approved through 01/01/15.  Notified pharmacy.

## 2014-02-18 ENCOUNTER — Encounter: Payer: Self-pay | Admitting: Infectious Diseases

## 2014-02-24 ENCOUNTER — Ambulatory Visit: Payer: Medicare HMO

## 2014-03-04 ENCOUNTER — Ambulatory Visit: Payer: Medicare HMO

## 2014-03-19 ENCOUNTER — Other Ambulatory Visit: Payer: Self-pay | Admitting: Infectious Diseases

## 2014-03-24 ENCOUNTER — Other Ambulatory Visit: Payer: Self-pay | Admitting: *Deleted

## 2014-03-24 DIAGNOSIS — R197 Diarrhea, unspecified: Secondary | ICD-10-CM

## 2014-03-24 MED ORDER — DIPHENOXYLATE-ATROPINE 2.5-0.025 MG PO TABS: ORAL_TABLET | ORAL | Status: DC

## 2014-03-30 ENCOUNTER — Other Ambulatory Visit: Payer: Self-pay | Admitting: Infectious Diseases

## 2014-03-30 DIAGNOSIS — S72002S Fracture of unspecified part of neck of left femur, sequela: Secondary | ICD-10-CM

## 2014-03-30 MED ORDER — HYDROCODONE-ACETAMINOPHEN 10-650 MG PO TABS: 1.0000 | ORAL_TABLET | Freq: Four times a day (QID) | ORAL | Status: DC | PRN

## 2014-03-31 ENCOUNTER — Other Ambulatory Visit: Payer: Self-pay | Admitting: *Deleted

## 2014-03-31 DIAGNOSIS — S72002S Fracture of unspecified part of neck of left femur, sequela: Secondary | ICD-10-CM

## 2014-04-01 ENCOUNTER — Other Ambulatory Visit: Payer: Self-pay | Admitting: *Deleted

## 2014-04-01 MED ORDER — HYDROCODONE-ACETAMINOPHEN 10-325 MG PO TABS: 1.0000 | ORAL_TABLET | Freq: Four times a day (QID) | ORAL | Status: DC | PRN

## 2014-04-08 ENCOUNTER — Other Ambulatory Visit: Payer: Medicare HMO

## 2014-04-08 DIAGNOSIS — Z79899 Other long term (current) drug therapy: Secondary | ICD-10-CM

## 2014-04-09 LAB — DRUG SCREEN, URINE
Amphetamine Screen, Ur: NEGATIVE
BENZODIAZEPINES.: NEGATIVE
Barbiturate Quant, Ur: NEGATIVE
COCAINE METABOLITES: NEGATIVE
CREATININE, U: 65.72 mg/dL
Marijuana Metabolite: NEGATIVE
Methadone: NEGATIVE
Opiates: NEGATIVE
Phencyclidine (PCP): NEGATIVE
Propoxyphene: NEGATIVE

## 2014-04-10 ENCOUNTER — Other Ambulatory Visit: Payer: Self-pay | Admitting: Infectious Diseases

## 2014-04-14 ENCOUNTER — Encounter (HOSPITAL_BASED_OUTPATIENT_CLINIC_OR_DEPARTMENT_OTHER): Payer: Medicare HMO | Attending: Surgery

## 2014-04-14 ENCOUNTER — Telehealth: Payer: Self-pay | Admitting: *Deleted

## 2014-04-14 DIAGNOSIS — L97521 Non-pressure chronic ulcer of other part of left foot limited to breakdown of skin: Secondary | ICD-10-CM | POA: Insufficient documentation

## 2014-04-14 DIAGNOSIS — E11621 Type 2 diabetes mellitus with foot ulcer: Secondary | ICD-10-CM | POA: Insufficient documentation

## 2014-04-14 NOTE — Telephone Encounter (Signed)
Patient called to advise that she is having an allergic reaction to an antibiotic she received from her PCP. She advised her tongue is swollen and she keeps biting it and it hurts and she would like for Dr Johnnye Sima to call her in some magic mouthwash. Advised her is she is truly having an allergic reaction she will need to call her PCP let them know what is going on and either be seen there or the ED. Advised her Dr Johnnye Sima is not in the office and the prescribing doctor needs to know what is going on with her. Also tried to get her to understand how dangerous it is not to be seen and that right now it is only her tongue but it could involve her throat next so she should call them asap. And if she can not get any helo to go to the ED or Urgent care. Will call her back in about 10 mins to make sure she is ok.

## 2014-04-14 NOTE — Telephone Encounter (Signed)
Called the patient back and was unable to reach her but left her a message to call the office and let us know she is all right.

## 2014-04-19 DIAGNOSIS — L97521 Non-pressure chronic ulcer of other part of left foot limited to breakdown of skin: Secondary | ICD-10-CM | POA: Diagnosis not present

## 2014-04-19 DIAGNOSIS — E11621 Type 2 diabetes mellitus with foot ulcer: Secondary | ICD-10-CM | POA: Diagnosis not present

## 2014-05-02 ENCOUNTER — Inpatient Hospital Stay (HOSPITAL_COMMUNITY)
Admission: EM | Admit: 2014-05-02 | Discharge: 2014-05-12 | DRG: 480 | Disposition: A | Discharge status: Skilled Nursing Facility | Payer: Medicare HMO | Attending: Internal Medicine | Admitting: Internal Medicine

## 2014-05-02 ENCOUNTER — Emergency Department (HOSPITAL_COMMUNITY): Payer: Medicare HMO

## 2014-05-02 DIAGNOSIS — S72142A Displaced intertrochanteric fracture of left femur, initial encounter for closed fracture: Principal | ICD-10-CM

## 2014-05-02 DIAGNOSIS — E118 Type 2 diabetes mellitus with unspecified complications: Secondary | ICD-10-CM

## 2014-05-02 DIAGNOSIS — F17210 Nicotine dependence, cigarettes, uncomplicated: Secondary | ICD-10-CM | POA: Diagnosis present

## 2014-05-02 DIAGNOSIS — E1140 Type 2 diabetes mellitus with diabetic neuropathy, unspecified: Secondary | ICD-10-CM | POA: Diagnosis present

## 2014-05-02 DIAGNOSIS — T404X5A Adverse effect of other synthetic narcotics, initial encounter: Secondary | ICD-10-CM | POA: Diagnosis present

## 2014-05-02 DIAGNOSIS — R05 Cough: Secondary | ICD-10-CM

## 2014-05-02 DIAGNOSIS — M1990 Unspecified osteoarthritis, unspecified site: Secondary | ICD-10-CM | POA: Diagnosis present

## 2014-05-02 DIAGNOSIS — I952 Hypotension due to drugs: Secondary | ICD-10-CM | POA: Diagnosis not present

## 2014-05-02 DIAGNOSIS — Z6834 Body mass index (BMI) 34.0-34.9, adult: Secondary | ICD-10-CM

## 2014-05-02 DIAGNOSIS — E860 Dehydration: Secondary | ICD-10-CM | POA: Diagnosis present

## 2014-05-02 DIAGNOSIS — Z79891 Long term (current) use of opiate analgesic: Secondary | ICD-10-CM

## 2014-05-02 DIAGNOSIS — M25552 Pain in left hip: Secondary | ICD-10-CM | POA: Diagnosis not present

## 2014-05-02 DIAGNOSIS — D638 Anemia in other chronic diseases classified elsewhere: Secondary | ICD-10-CM | POA: Diagnosis present

## 2014-05-02 DIAGNOSIS — N179 Acute kidney failure, unspecified: Secondary | ICD-10-CM | POA: Diagnosis present

## 2014-05-02 DIAGNOSIS — E876 Hypokalemia: Secondary | ICD-10-CM | POA: Diagnosis not present

## 2014-05-02 DIAGNOSIS — S52532A Colles' fracture of left radius, initial encounter for closed fracture: Secondary | ICD-10-CM

## 2014-05-02 DIAGNOSIS — Z888 Allergy status to other drugs, medicaments and biological substances status: Secondary | ICD-10-CM

## 2014-05-02 DIAGNOSIS — R296 Repeated falls: Secondary | ICD-10-CM | POA: Diagnosis present

## 2014-05-02 DIAGNOSIS — R509 Fever, unspecified: Secondary | ICD-10-CM

## 2014-05-02 DIAGNOSIS — L089 Local infection of the skin and subcutaneous tissue, unspecified: Secondary | ICD-10-CM | POA: Diagnosis present

## 2014-05-02 DIAGNOSIS — Z79899 Other long term (current) drug therapy: Secondary | ICD-10-CM

## 2014-05-02 DIAGNOSIS — Z833 Family history of diabetes mellitus: Secondary | ICD-10-CM

## 2014-05-02 DIAGNOSIS — IMO0002 Reserved for concepts with insufficient information to code with codable children: Secondary | ICD-10-CM | POA: Diagnosis present

## 2014-05-02 DIAGNOSIS — E11649 Type 2 diabetes mellitus with hypoglycemia without coma: Secondary | ICD-10-CM | POA: Diagnosis present

## 2014-05-02 DIAGNOSIS — W19XXXA Unspecified fall, initial encounter: Secondary | ICD-10-CM

## 2014-05-02 DIAGNOSIS — S2243XA Multiple fractures of ribs, bilateral, initial encounter for closed fracture: Secondary | ICD-10-CM | POA: Diagnosis present

## 2014-05-02 DIAGNOSIS — Z8249 Family history of ischemic heart disease and other diseases of the circulatory system: Secondary | ICD-10-CM

## 2014-05-02 DIAGNOSIS — K219 Gastro-esophageal reflux disease without esophagitis: Secondary | ICD-10-CM | POA: Diagnosis present

## 2014-05-02 DIAGNOSIS — B20 Human immunodeficiency virus [HIV] disease: Secondary | ICD-10-CM | POA: Diagnosis present

## 2014-05-02 DIAGNOSIS — E1165 Type 2 diabetes mellitus with hyperglycemia: Secondary | ICD-10-CM | POA: Diagnosis present

## 2014-05-02 DIAGNOSIS — F419 Anxiety disorder, unspecified: Secondary | ICD-10-CM | POA: Diagnosis present

## 2014-05-02 DIAGNOSIS — J45909 Unspecified asthma, uncomplicated: Secondary | ICD-10-CM | POA: Diagnosis present

## 2014-05-02 DIAGNOSIS — T39395A Adverse effect of other nonsteroidal anti-inflammatory drugs [NSAID], initial encounter: Secondary | ICD-10-CM | POA: Diagnosis present

## 2014-05-02 DIAGNOSIS — E785 Hyperlipidemia, unspecified: Secondary | ICD-10-CM | POA: Diagnosis present

## 2014-05-02 DIAGNOSIS — M25559 Pain in unspecified hip: Secondary | ICD-10-CM

## 2014-05-02 DIAGNOSIS — M87252 Osteonecrosis due to previous trauma, left femur: Secondary | ICD-10-CM | POA: Diagnosis present

## 2014-05-02 DIAGNOSIS — R52 Pain, unspecified: Secondary | ICD-10-CM

## 2014-05-02 DIAGNOSIS — S52615A Nondisplaced fracture of left ulna styloid process, initial encounter for closed fracture: Secondary | ICD-10-CM | POA: Diagnosis present

## 2014-05-02 DIAGNOSIS — F329 Major depressive disorder, single episode, unspecified: Secondary | ICD-10-CM | POA: Diagnosis present

## 2014-05-02 DIAGNOSIS — R059 Cough, unspecified: Secondary | ICD-10-CM

## 2014-05-02 DIAGNOSIS — I10 Essential (primary) hypertension: Secondary | ICD-10-CM | POA: Diagnosis present

## 2014-05-02 DIAGNOSIS — Z794 Long term (current) use of insulin: Secondary | ICD-10-CM

## 2014-05-02 DIAGNOSIS — M86172 Other acute osteomyelitis, left ankle and foot: Secondary | ICD-10-CM

## 2014-05-02 LAB — CBC WITH DIFFERENTIAL/PLATELET
Basophils Absolute: 0 10*3/uL (ref 0.0–0.1)
Basophils Relative: 0 % (ref 0–1)
EOS ABS: 0.2 10*3/uL (ref 0.0–0.7)
Eosinophils Relative: 2 % (ref 0–5)
HCT: 29.7 % — ABNORMAL LOW (ref 36.0–46.0)
HEMOGLOBIN: 9.1 g/dL — AB (ref 12.0–15.0)
LYMPHS ABS: 1.5 10*3/uL (ref 0.7–4.0)
LYMPHS PCT: 13 % (ref 12–46)
MCH: 27.6 pg (ref 26.0–34.0)
MCHC: 30.6 g/dL (ref 30.0–36.0)
MCV: 90 fL (ref 78.0–100.0)
Monocytes Absolute: 0.7 10*3/uL (ref 0.1–1.0)
Monocytes Relative: 6 % (ref 3–12)
NEUTROS ABS: 8.9 10*3/uL — AB (ref 1.7–7.7)
NEUTROS PCT: 79 % — AB (ref 43–77)
PLATELETS: 249 10*3/uL (ref 150–400)
RBC: 3.3 MIL/uL — ABNORMAL LOW (ref 3.87–5.11)
RDW: 20.9 % — ABNORMAL HIGH (ref 11.5–15.5)
WBC: 11.3 10*3/uL — AB (ref 4.0–10.5)

## 2014-05-02 LAB — BASIC METABOLIC PANEL
ANION GAP: 7 (ref 5–15)
BUN: 39 mg/dL — ABNORMAL HIGH (ref 6–20)
CALCIUM: 8.1 mg/dL — AB (ref 8.9–10.3)
CO2: 20 mmol/L — AB (ref 22–32)
Chloride: 107 mmol/L (ref 101–111)
Creatinine, Ser: 4.07 mg/dL — ABNORMAL HIGH (ref 0.44–1.00)
GFR calc Af Amer: 14 mL/min — ABNORMAL LOW (ref >60)
GFR calc non Af Amer: 12 mL/min — ABNORMAL LOW (ref >60)
Glucose, Bld: 250 mg/dL — ABNORMAL HIGH (ref 70–99)
Potassium: 3.5 mmol/L (ref 3.5–5.1)
SODIUM: 134 mmol/L — AB (ref 135–145)

## 2014-05-02 MED ORDER — SODIUM CHLORIDE 0.9 % IV BOLUS (SEPSIS)
1000.0000 mL | Freq: Once | INTRAVENOUS | Status: AC
  Administered 2014-05-02: 1000 mL via INTRAVENOUS

## 2014-05-02 MED ORDER — FENTANYL CITRATE (PF) 100 MCG/2ML IJ SOLN
100.0000 ug | Freq: Once | INTRAMUSCULAR | Status: AC
  Administered 2014-05-02: 100 ug via INTRAVENOUS
  Filled 2014-05-02: qty 2

## 2014-05-02 NOTE — ED Provider Notes (Signed)
CSN: [id number]     Arrival date & time 05/02/14  2059 History   First MD Initiated Contact with Patient 05/02/14 2110     Chief Complaint  Patient presents with  . Fall    left hip     (Consider location/radiation/quality/duration/timing/severity/associated sxs/prior Treatment) HPI Comments: Patient with a history of HIV presents today with bilateral hip pain, worse on the left hip.  She states that the pain has been present since she fell two days ago.  She states that she landed on her left hip when she fell and then rolled unto her right hip.  She states that someone helped her get up after the fall, but she has not ambulated since the fall.  She reports that she has been using a wheelchair.  She denies hitting her head or LOC when she fell.  She states that her cat darted out in front of her, which scared her and caused her to lose her footing, which caused the fall.  She reports that the pain radiates down her left leg.  She has been taking Tylenol and Ibuprofen for the pain with mild relief.  She denies knee pain, ankle pain, back pain, neck pain, headache, chest pain, fever, or SOB.  She reports that she has been compliant with her HIV medications.    The history is provided by the patient.    Past Medical History  Diagnosis Date  . Anxiety   . Depression   . Hypertension   . HIV infection 2000  . Asthma     HOSPITALIZED WITH EXCERBATION OF ASTHMA - AND BRONCHITIS AND THE FLU DEC 2013  . Shortness of breath     ALLERGIES ARE "ACTING UP"  . Diabetes mellitus     ON INSULIN AND ORAL MEDICATIONS  . GERD (gastroesophageal reflux disease)   . Neuromuscular disorder     NEUROPATHY HANDS AND FEET  . Arthritis     LEFT HIP  . Pain     SEVERE PAIN LEFT HIP - HX OF LEFT HIP PINNING JULY 2013   Past Surgical History  Procedure Laterality Date  . Hip pinning,cannulated  07/16/2011    Procedure: CANNULATED HIP PINNING;  Surgeon: Gearlean Alf, MD;  Location: [facility];  Service:  Orthopedics;  Laterality: Left;  . Hip pinning,cannulated Left 04/09/2012    Procedure: CANNULATED HIP PINNING AND HARDWARE REVISION;  Surgeon: Gearlean Alf, MD;  Location: [facility];  Service: Orthopedics;  Laterality: Left;   Family History  Problem Relation Age of Onset  . Diabetes Mother   . Hypertension Mother   . Vision loss Mother   . Hypertension Father    History  Substance Use Topics  . Smoking status: Current Every Day Smoker -- 0.50 packs/day for 35 years    Types: E-cigarettes  . Smokeless tobacco: Never Used  . Alcohol Use: No     Comment: no drink since 2008   OB History    Gravida Para Term Preterm AB TAB SAB Ectopic Multiple Living   2 1 1  0 1  1 0 0 1     Review of Systems  All other systems reviewed and are negative.     Allergies  Cortizone-10  Home Medications   Prior to Admission medications   Medication Sig Start Date End Date Taking? Authorizing Provider  albuterol (PROVENTIL) (2.5 MG/3ML) 0.083% nebulizer solution Take 2.5 mg by nebulization every 6 (six) hours as needed for wheezing. For wheezing or shortness of breath  01/01/12   Hosie Poisson, MD  ARIPiprazole (ABILIFY) 2 MG tablet Take 2 mg by mouth daily. Mental health    Historical Provider, MD  atenolol (TENORMIN) 25 MG tablet Take 25 mg by mouth every morning.     Historical Provider, MD  ATRIPLA 600-200-300 MG per tablet TAKE 1 TABLET BY MOUTH EVERY NIGHT AT BEDTIME 04/12/14   Campbell Riches, MD  budesonide-formoterol [facility]) 80-4.5 MCG/ACT inhaler Inhale 2 puffs into the lungs 2 (two) times daily. 01/01/12   Hosie Poisson, MD  cyclobenzaprine (FLEXERIL) 10 MG tablet Take 1 tablet (10 mg total) by mouth 2 (two) times daily as needed for muscle spasms. 11/24/13   Evelina Bucy, MD  dextromethorphan-guaiFENesin [facility] DM) 30-600 MG per 12 hr tablet Take 1 tablet by mouth 2 (two) times daily as needed (congestion). 01/01/12   Hosie Poisson, MD  diphenoxylate-atropine (LOMOTIL) 2.5-0.025 MG  per tablet TAKE 1 TABLET BY MOUTH FOUR TIMES DAILY AS NEEDED FOR DIARRHEA/LOOSE STOOLS 03/24/14   Campbell Riches, MD  escitalopram (LEXAPRO) 5 MG tablet Take 5 mg by mouth daily. Mental health    Historical Provider, MD  Exenatide (BYETTA 10 MCG PEN [place]) Inject 10 Units into the skin 2 (two) times daily.    Historical Provider, MD  fenofibrate (TRICOR) 145 MG tablet Take 1 tablet (145 mg total) by mouth daily. 01/06/13   Campbell Riches, MD  furosemide (LASIX) 20 MG tablet Take 1 tablet (20 mg total) by mouth daily. Helps sodium level.  Take with potassium daily. 07/31/12   Melton Alar, PA-C  HYDROcodone-acetaminophen (NORCO) 10-325 MG per tablet Take 1 tablet by mouth every 6 (six) hours as needed. 04/01/14   Campbell Riches, MD  insulin aspart (NOVOLOG) 100 UNIT/ML injection Inject 25 Units into the skin 3 (three) times daily before meals. Pt takes 15 units at each meals but sometimes may need more.    Historical Provider, MD  itraconazole (SPORANOX) 10 MG/ML solution Take 10 mLs (100 mg total) by mouth daily. Swish and swallow 01/15/14   Campbell Riches, MD  metFORMIN (GLUCOPHAGE) 1000 MG tablet Take 1,000 mg by mouth 2 (two) times daily with a meal.    Historical Provider, MD  Multiple Vitamins-Minerals (MULTIVITAMIN WITH MINERALS) tablet Take 1 tablet by mouth daily.    Historical Provider, MD  pantoprazole (PROTONIX) 40 MG tablet Take 40 mg by mouth every morning.  07/13/10   Lyndee Hensen, NP  potassium chloride (K-DUR) 10 MEQ tablet Take 1 tablet (10 mEq total) by mouth daily. Take with lasix / furosemide 07/31/12   Bobby Rumpf York, PA-C  pregabalin (LYRICA) 225 MG capsule Take 225 mg by mouth 2 (two) times daily.    Historical Provider, MD  traMADol (ULTRAM) 50 MG tablet Take 50 mg by mouth every 6 (six) hours as needed for pain.    Historical Provider, MD  vitamin E (VITAMIN E) 1000 UNIT capsule Take 1,000 Units by mouth daily.     Historical Provider, MD   BP 96/63 mmHg  Pulse  76  Temp(Src) 98.4 F (36.9 C) (Oral)  Resp 16  SpO2 92%  LMP 11/16/2010 Physical Exam  Constitutional: She appears well-developed and well-nourished.  HENT:  Head: Normocephalic and atraumatic.  Mouth/Throat: Oropharynx is clear and moist.  Neck: Normal range of motion. Neck supple.  Cardiovascular: Normal rate, regular rhythm and normal heart sounds.   Pulses:      Dorsalis pedis pulses are 2+ on the right side, and  2+ on the left side.  Pulmonary/Chest: Effort normal and breath sounds normal.  Abdominal: Soft. Bowel sounds are normal. She exhibits no distension and no mass. There is no tenderness. There is no rebound and no guarding.  Musculoskeletal:       Right hip: She exhibits decreased range of motion and tenderness. She exhibits no swelling and no deformity.       Left hip: She exhibits decreased range of motion and tenderness. She exhibits no swelling and no deformity.       Right knee: She exhibits normal range of motion and no swelling. No tenderness found.       Left knee: She exhibits normal range of motion and no swelling. No tenderness found.       Right ankle: She exhibits normal range of motion and no swelling. No tenderness.       Left ankle: She exhibits normal range of motion and no swelling. No tenderness.       Cervical back: She exhibits normal range of motion, no tenderness, no bony tenderness, no swelling, no edema and no deformity.       Thoracic back: She exhibits normal range of motion, no tenderness, no bony tenderness, no swelling, no edema and no deformity.       Lumbar back: She exhibits normal range of motion, no tenderness, no bony tenderness, no swelling, no edema and no deformity.  Neurological: She is alert.  Distal sensation of both feet intact  Skin: Skin is warm and dry.  Psychiatric: She has a normal mood and affect.  Nursing note and vitals reviewed.   ED Course  Procedures (including critical care time) Labs Review Labs Reviewed - No  data to display  Imaging Review No results found.   EKG Interpretation None      MDM   Final diagnoses:  Left hip pain  Hip pain   Patient presents today with left hip pain that has been present since a mechanical fall that occurred two days ago.  Xray of both hips and pelvic is negative.  Patient initially mildly hypotensive and then became more hypotensive after given Fentanyl.  Patient given IVF with improvement in blood pressure.  She is on Atenolol for blood pressure, but reports that her dose has not changed in years.  Labs show a Creatine of over 4.  Review of the chart shows a baseline around 1.5 just 5 months ago.  She does report that she has been taking Ibuprofen 600 mg every 4-6 hours over the past week, which may be contributing to this.  She denies any flank pain.  UA pending.  She also has a hemoglobin of 9.1, which is down from her baseline.  Hemoccult is negative.  She later than stated that she has been coughing for the past month and would like to be checked from Pneumonia.  CXR also pending.  Patient signed out to Noland Fordyce at shift change.  Plan is for the patient to be admitted for ARF.      Hyman Bible, PA-C 05/03/14 2104

## 2014-05-02 NOTE — ED Notes (Signed)
Bed: [id number] Expected date: 05/02/14 Expected time: 8:39 PM Means of arrival: Ambulance Comments: F  Fall, left side pain

## 2014-05-02 NOTE — ED Notes (Addendum)
Patient is unable to move left leg and reports extreme pain with palpation and ROM.

## 2014-05-02 NOTE — ED Notes (Signed)
EMS called to home.  Found patient sitting in her wheelchair.  Patients states that she fell  On her right hip yesterday.  EMS states no deformities, no swelling, no bruising.   Currently she rates her pain 10 of 10.

## 2014-05-03 ENCOUNTER — Emergency Department (HOSPITAL_COMMUNITY): Payer: Medicare HMO

## 2014-05-03 ENCOUNTER — Inpatient Hospital Stay (HOSPITAL_COMMUNITY): Payer: Medicare HMO

## 2014-05-03 ENCOUNTER — Encounter (HOSPITAL_BASED_OUTPATIENT_CLINIC_OR_DEPARTMENT_OTHER): Payer: No Typology Code available for payment source

## 2014-05-03 ENCOUNTER — Encounter (HOSPITAL_COMMUNITY): Payer: Self-pay | Admitting: Internal Medicine

## 2014-05-03 DIAGNOSIS — S72142A Displaced intertrochanteric fracture of left femur, initial encounter for closed fracture: Secondary | ICD-10-CM | POA: Diagnosis present

## 2014-05-03 DIAGNOSIS — M25559 Pain in unspecified hip: Secondary | ICD-10-CM | POA: Insufficient documentation

## 2014-05-03 DIAGNOSIS — E1140 Type 2 diabetes mellitus with diabetic neuropathy, unspecified: Secondary | ICD-10-CM | POA: Diagnosis present

## 2014-05-03 DIAGNOSIS — M87252 Osteonecrosis due to previous trauma, left femur: Secondary | ICD-10-CM | POA: Diagnosis present

## 2014-05-03 DIAGNOSIS — M25552 Pain in left hip: Secondary | ICD-10-CM | POA: Diagnosis not present

## 2014-05-03 DIAGNOSIS — S52532A Colles' fracture of left radius, initial encounter for closed fracture: Secondary | ICD-10-CM | POA: Diagnosis present

## 2014-05-03 DIAGNOSIS — K219 Gastro-esophageal reflux disease without esophagitis: Secondary | ICD-10-CM | POA: Diagnosis present

## 2014-05-03 DIAGNOSIS — N179 Acute kidney failure, unspecified: Secondary | ICD-10-CM | POA: Diagnosis not present

## 2014-05-03 DIAGNOSIS — R05 Cough: Secondary | ICD-10-CM | POA: Diagnosis not present

## 2014-05-03 DIAGNOSIS — J45909 Unspecified asthma, uncomplicated: Secondary | ICD-10-CM | POA: Diagnosis present

## 2014-05-03 DIAGNOSIS — Z79899 Other long term (current) drug therapy: Secondary | ICD-10-CM | POA: Diagnosis not present

## 2014-05-03 DIAGNOSIS — Z79891 Long term (current) use of opiate analgesic: Secondary | ICD-10-CM | POA: Diagnosis not present

## 2014-05-03 DIAGNOSIS — R296 Repeated falls: Secondary | ICD-10-CM | POA: Diagnosis present

## 2014-05-03 DIAGNOSIS — S52615A Nondisplaced fracture of left ulna styloid process, initial encounter for closed fracture: Secondary | ICD-10-CM | POA: Diagnosis present

## 2014-05-03 DIAGNOSIS — E118 Type 2 diabetes mellitus with unspecified complications: Secondary | ICD-10-CM

## 2014-05-03 DIAGNOSIS — Z8249 Family history of ischemic heart disease and other diseases of the circulatory system: Secondary | ICD-10-CM | POA: Diagnosis not present

## 2014-05-03 DIAGNOSIS — W19XXXA Unspecified fall, initial encounter: Secondary | ICD-10-CM | POA: Diagnosis present

## 2014-05-03 DIAGNOSIS — T404X5A Adverse effect of other synthetic narcotics, initial encounter: Secondary | ICD-10-CM | POA: Diagnosis present

## 2014-05-03 DIAGNOSIS — E11649 Type 2 diabetes mellitus with hypoglycemia without coma: Secondary | ICD-10-CM | POA: Diagnosis present

## 2014-05-03 DIAGNOSIS — T39395A Adverse effect of other nonsteroidal anti-inflammatory drugs [NSAID], initial encounter: Secondary | ICD-10-CM | POA: Diagnosis present

## 2014-05-03 DIAGNOSIS — Z794 Long term (current) use of insulin: Secondary | ICD-10-CM | POA: Diagnosis not present

## 2014-05-03 DIAGNOSIS — L089 Local infection of the skin and subcutaneous tissue, unspecified: Secondary | ICD-10-CM | POA: Diagnosis present

## 2014-05-03 DIAGNOSIS — F419 Anxiety disorder, unspecified: Secondary | ICD-10-CM | POA: Diagnosis present

## 2014-05-03 DIAGNOSIS — N189 Chronic kidney disease, unspecified: Secondary | ICD-10-CM | POA: Diagnosis present

## 2014-05-03 DIAGNOSIS — F329 Major depressive disorder, single episode, unspecified: Secondary | ICD-10-CM | POA: Diagnosis present

## 2014-05-03 DIAGNOSIS — M1990 Unspecified osteoarthritis, unspecified site: Secondary | ICD-10-CM | POA: Diagnosis present

## 2014-05-03 DIAGNOSIS — Z833 Family history of diabetes mellitus: Secondary | ICD-10-CM | POA: Diagnosis not present

## 2014-05-03 DIAGNOSIS — Z888 Allergy status to other drugs, medicaments and biological substances status: Secondary | ICD-10-CM | POA: Diagnosis not present

## 2014-05-03 DIAGNOSIS — B20 Human immunodeficiency virus [HIV] disease: Secondary | ICD-10-CM | POA: Diagnosis present

## 2014-05-03 DIAGNOSIS — S2243XA Multiple fractures of ribs, bilateral, initial encounter for closed fracture: Secondary | ICD-10-CM | POA: Diagnosis present

## 2014-05-03 DIAGNOSIS — E785 Hyperlipidemia, unspecified: Secondary | ICD-10-CM | POA: Diagnosis present

## 2014-05-03 DIAGNOSIS — R0600 Dyspnea, unspecified: Secondary | ICD-10-CM | POA: Diagnosis not present

## 2014-05-03 DIAGNOSIS — F17210 Nicotine dependence, cigarettes, uncomplicated: Secondary | ICD-10-CM | POA: Diagnosis present

## 2014-05-03 DIAGNOSIS — E876 Hypokalemia: Secondary | ICD-10-CM | POA: Diagnosis not present

## 2014-05-03 DIAGNOSIS — E860 Dehydration: Secondary | ICD-10-CM | POA: Diagnosis present

## 2014-05-03 DIAGNOSIS — I10 Essential (primary) hypertension: Secondary | ICD-10-CM | POA: Diagnosis present

## 2014-05-03 DIAGNOSIS — I952 Hypotension due to drugs: Secondary | ICD-10-CM | POA: Diagnosis not present

## 2014-05-03 DIAGNOSIS — E1165 Type 2 diabetes mellitus with hyperglycemia: Secondary | ICD-10-CM | POA: Diagnosis present

## 2014-05-03 DIAGNOSIS — D638 Anemia in other chronic diseases classified elsewhere: Secondary | ICD-10-CM | POA: Diagnosis present

## 2014-05-03 DIAGNOSIS — Z6834 Body mass index (BMI) 34.0-34.9, adult: Secondary | ICD-10-CM | POA: Diagnosis not present

## 2014-05-03 HISTORY — DX: Pain in left hip: M25.552

## 2014-05-03 HISTORY — DX: Acute kidney failure, unspecified: N17.9

## 2014-05-03 LAB — GLUCOSE, CAPILLARY
GLUCOSE-CAPILLARY: 202 mg/dL — AB (ref 70–99)
Glucose-Capillary: 170 mg/dL — ABNORMAL HIGH (ref 70–99)
Glucose-Capillary: 151 mg/dL — ABNORMAL HIGH (ref 70–99)
Glucose-Capillary: 213 mg/dL — ABNORMAL HIGH (ref 70–99)

## 2014-05-03 LAB — BASIC METABOLIC PANEL
Anion gap: 10 (ref 5–15)
BUN: 38 mg/dL — AB (ref 6–20)
CHLORIDE: 107 mmol/L (ref 101–111)
CO2: 22 mmol/L (ref 22–32)
CREATININE: 3.72 mg/dL — AB (ref 0.44–1.00)
Calcium: 8.5 mg/dL — ABNORMAL LOW (ref 8.9–10.3)
GFR, EST AFRICAN AMERICAN: 15 mL/min — AB (ref >60)
GFR, EST NON AFRICAN AMERICAN: 13 mL/min — AB (ref >60)
Glucose, Bld: 162 mg/dL — ABNORMAL HIGH (ref 70–99)
Potassium: 3.3 mmol/L — ABNORMAL LOW (ref 3.5–5.1)
SODIUM: 139 mmol/L (ref 135–145)

## 2014-05-03 LAB — I-STAT CG4 LACTIC ACID, ED: LACTIC ACID, VENOUS: 0.78 mmol/L (ref 0.5–2.0)

## 2014-05-03 LAB — CBC
HCT: 30.8 % — ABNORMAL LOW (ref 36.0–46.0)
Hemoglobin: 9.2 g/dL — ABNORMAL LOW (ref 12.0–15.0)
MCH: 27.2 pg (ref 26.0–34.0)
MCHC: 29.9 g/dL — ABNORMAL LOW (ref 30.0–36.0)
MCV: 91.1 fL (ref 78.0–100.0)
PLATELETS: 251 10*3/uL (ref 150–400)
RBC: 3.38 MIL/uL — AB (ref 3.87–5.11)
RDW: 21.1 % — ABNORMAL HIGH (ref 11.5–15.5)
WBC: 10.4 10*3/uL (ref 4.0–10.5)

## 2014-05-03 LAB — URINE MICROSCOPIC-ADD ON

## 2014-05-03 LAB — URINALYSIS, ROUTINE W REFLEX MICROSCOPIC
Bilirubin Urine: NEGATIVE
KETONES UR: NEGATIVE mg/dL
Leukocytes, UA: NEGATIVE
Nitrite: NEGATIVE
Protein, ur: 100 mg/dL — AB
Specific Gravity, Urine: 1.021 (ref 1.005–1.030)
Urobilinogen, UA: 0.2 mg/dL (ref 0.0–1.0)
pH: 5.5 (ref 5.0–8.0)

## 2014-05-03 MED ORDER — EFAVIRENZ 600 MG PO TABS
600.0000 mg | ORAL_TABLET | Freq: Every day | ORAL | Status: DC
  Administered 2014-05-03 – 2014-05-11 (×9): 600 mg via ORAL
  Filled 2014-05-03 (×10): qty 1

## 2014-05-03 MED ORDER — SODIUM CHLORIDE 0.9 % IV SOLN
INTRAVENOUS | Status: DC
  Administered 2014-05-03 (×2): via INTRAVENOUS

## 2014-05-03 MED ORDER — TENOFOVIR DISOPROXIL FUMARATE 300 MG PO TABS: 300.0000 mg | ORAL_TABLET | ORAL | Status: DC

## 2014-05-03 MED ORDER — FENTANYL CITRATE (PF) 100 MCG/2ML IJ SOLN: 50.0000 ug | Freq: Once | INTRAMUSCULAR | Status: DC

## 2014-05-03 MED ORDER — SODIUM CHLORIDE 0.9 % IV BOLUS (SEPSIS)
1000.0000 mL | Freq: Once | INTRAVENOUS | Status: AC
  Administered 2014-05-03: 1000 mL via INTRAVENOUS

## 2014-05-03 MED ORDER — EMTRICITABINE 200 MG PO CAPS: 200.0000 mg | ORAL_CAPSULE | ORAL | Status: DC

## 2014-05-03 MED ORDER — ACETAMINOPHEN 650 MG RE SUPP: 650.0000 mg | Freq: Four times a day (QID) | RECTAL | Status: DC | PRN

## 2014-05-03 MED ORDER — HYDROMORPHONE HCL 1 MG/ML IJ SOLN
0.5000 mg | INTRAMUSCULAR | Status: DC | PRN
  Administered 2014-05-04 – 2014-05-05 (×9): 1 mg via INTRAVENOUS
  Filled 2014-05-03 (×9): qty 1

## 2014-05-03 MED ORDER — HEPARIN SODIUM (PORCINE) 5000 UNIT/ML IJ SOLN
5000.0000 [IU] | Freq: Three times a day (TID) | INTRAMUSCULAR | Status: DC
  Administered 2014-05-03 – 2014-05-04 (×6): 5000 [IU] via SUBCUTANEOUS
  Filled 2014-05-03 (×10): qty 1

## 2014-05-03 MED ORDER — ONDANSETRON HCL 4 MG PO TABS: 4.0000 mg | ORAL_TABLET | Freq: Four times a day (QID) | ORAL | Status: DC | PRN

## 2014-05-03 MED ORDER — FENTANYL CITRATE (PF) 100 MCG/2ML IJ SOLN
50.0000 ug | Freq: Once | INTRAMUSCULAR | Status: AC
  Administered 2014-05-03: 50 ug via INTRAVENOUS
  Filled 2014-05-03: qty 2

## 2014-05-03 MED ORDER — POTASSIUM CHLORIDE IN NACL 20-0.9 MEQ/L-% IV SOLN
INTRAVENOUS | Status: DC
  Administered 2014-05-03 – 2014-05-04 (×2): via INTRAVENOUS
  Filled 2014-05-03 (×5): qty 1000

## 2014-05-03 MED ORDER — OXYCODONE HCL 5 MG PO TABS
5.0000 mg | ORAL_TABLET | ORAL | Status: DC | PRN
  Administered 2014-05-03 – 2014-05-05 (×9): 5 mg via ORAL
  Filled 2014-05-03 (×9): qty 1

## 2014-05-03 MED ORDER — ESCITALOPRAM OXALATE 5 MG PO TABS
5.0000 mg | ORAL_TABLET | Freq: Every day | ORAL | Status: DC
  Administered 2014-05-03 – 2014-05-12 (×9): 5 mg via ORAL
  Filled 2014-05-03 (×10): qty 1

## 2014-05-03 MED ORDER — PREGABALIN 75 MG PO CAPS
225.0000 mg | ORAL_CAPSULE | Freq: Two times a day (BID) | ORAL | Status: DC
  Administered 2014-05-03 – 2014-05-09 (×11): 225 mg via ORAL
  Filled 2014-05-03 (×11): qty 3

## 2014-05-03 MED ORDER — ALUM & MAG HYDROXIDE-SIMETH 200-200-20 MG/5ML PO SUSP: 30.0000 mL | Freq: Four times a day (QID) | ORAL | Status: DC | PRN

## 2014-05-03 MED ORDER — INSULIN GLARGINE 100 UNIT/ML ~~LOC~~ SOLN
35.0000 [IU] | Freq: Two times a day (BID) | SUBCUTANEOUS | Status: DC
  Administered 2014-05-03 – 2014-05-05 (×5): 35 [IU] via SUBCUTANEOUS
  Filled 2014-05-03 (×6): qty 0.35

## 2014-05-03 MED ORDER — INSULIN ASPART 100 UNIT/ML ~~LOC~~ SOLN
0.0000 [IU] | Freq: Every day | SUBCUTANEOUS | Status: DC
  Administered 2014-05-03 – 2014-05-10 (×4): 2 [IU] via SUBCUTANEOUS

## 2014-05-03 MED ORDER — ATENOLOL 25 MG PO TABS
25.0000 mg | ORAL_TABLET | Freq: Every morning | ORAL | Status: DC
  Administered 2014-05-03 – 2014-05-07 (×4): 25 mg via ORAL
  Filled 2014-05-03 (×5): qty 1

## 2014-05-03 MED ORDER — ACETAMINOPHEN 325 MG PO TABS
650.0000 mg | ORAL_TABLET | Freq: Four times a day (QID) | ORAL | Status: DC | PRN
  Administered 2014-05-06 – 2014-05-07 (×2): 650 mg via ORAL
  Filled 2014-05-03 (×2): qty 2

## 2014-05-03 MED ORDER — INSULIN ASPART 100 UNIT/ML ~~LOC~~ SOLN
0.0000 [IU] | Freq: Three times a day (TID) | SUBCUTANEOUS | Status: DC
  Administered 2014-05-03: 3 [IU] via SUBCUTANEOUS
  Administered 2014-05-03: 5 [IU] via SUBCUTANEOUS
  Administered 2014-05-03 – 2014-05-07 (×8): 3 [IU] via SUBCUTANEOUS
  Administered 2014-05-08: 5 [IU] via SUBCUTANEOUS
  Administered 2014-05-08 (×2): 3 [IU] via SUBCUTANEOUS
  Administered 2014-05-09: 5 [IU] via SUBCUTANEOUS
  Administered 2014-05-09 – 2014-05-10 (×2): 2 [IU] via SUBCUTANEOUS
  Administered 2014-05-10: 5 [IU] via SUBCUTANEOUS
  Administered 2014-05-10: 3 [IU] via SUBCUTANEOUS
  Administered 2014-05-11: 2 [IU] via SUBCUTANEOUS
  Administered 2014-05-11 – 2014-05-12 (×3): 3 [IU] via SUBCUTANEOUS
  Administered 2014-05-12: 2 [IU] via SUBCUTANEOUS
  Administered 2014-05-12: 5 [IU] via SUBCUTANEOUS

## 2014-05-03 MED ORDER — ROSUVASTATIN CALCIUM 20 MG PO TABS
20.0000 mg | ORAL_TABLET | Freq: Every day | ORAL | Status: DC
  Administered 2014-05-03 – 2014-05-12 (×9): 20 mg via ORAL
  Filled 2014-05-03 (×10): qty 1

## 2014-05-03 MED ORDER — VITAMIN D3 25 MCG (1000 UNIT) PO TABS
5000.0000 [IU] | ORAL_TABLET | Freq: Every day | ORAL | Status: DC
  Administered 2014-05-03 – 2014-05-12 (×9): 5000 [IU] via ORAL
  Filled 2014-05-03 (×10): qty 5

## 2014-05-03 MED ORDER — PANTOPRAZOLE SODIUM 40 MG PO TBEC
40.0000 mg | DELAYED_RELEASE_TABLET | Freq: Every morning | ORAL | Status: DC
  Administered 2014-05-03 – 2014-05-12 (×9): 40 mg via ORAL
  Filled 2014-05-03 (×10): qty 1

## 2014-05-03 MED ORDER — ONDANSETRON HCL 4 MG/2ML IJ SOLN: 4.0000 mg | Freq: Four times a day (QID) | INTRAMUSCULAR | Status: DC | PRN

## 2014-05-03 MED ORDER — ARIPIPRAZOLE 2 MG PO TABS
2.0000 mg | ORAL_TABLET | Freq: Every day | ORAL | Status: DC
  Administered 2014-05-03 – 2014-05-12 (×9): 2 mg via ORAL
  Filled 2014-05-03 (×11): qty 1

## 2014-05-03 MED ORDER — FENOFIBRATE 160 MG PO TABS
160.0000 mg | ORAL_TABLET | Freq: Every day | ORAL | Status: DC
  Administered 2014-05-03 – 2014-05-12 (×9): 160 mg via ORAL
  Filled 2014-05-03 (×10): qty 1

## 2014-05-03 NOTE — ED Provider Notes (Signed)
Medical screening examination/treatment/procedure(s) were conducted as a shared visit with non-physician practitioner(s) and myself.  I personally evaluated the patient during the encounter.  Pt presents with complaints of hip pain after a fall.  In the ED however her BP is low.   No complaints of vomiting, diarrhea, fevers.  History of renal insufficiency but today creatinine is acutely elevated.    Will add on EKG and lactic acid level.  Will need admission for observation.  BP could be related to her BP medications and pain meds.  Will continue to monitor closely.  Dorie Rank, MD 05/03/14 0005

## 2014-05-03 NOTE — ED Provider Notes (Signed)
1:13 AM Pt signed out at shift change by Hyman Bible, PA-C.  Pt initially presented to ED with c/o Left hip pain due to a mechanical fall. Pt reports her cat ran out and scared her causing her to fall. Denies hitting her head or LOC.  Plain films negative for hip fracture. Per Nira Conn, low concern for hip fracture.  Pt did receive fentanyl in ED and was found to have low BP.  Dr. Dorie Rank assessed pt, states pt should be admitted for observation due to low BP as well as acutely elevated Cr. BP dropped to 85/59 while in ED, however, responded well after IV fluids. BP now: 123/60 CXR was performed as pt was c/o cough and was concerned she had pneumonia. CXR: no acute cardiopulmonary disease. Nira Conn reports performing a hemoccult due to slight decreased in Hgb. Hemoccult was negative but did not cross over in the system.  UA: moderate Hgb in UA, >1000 glucose  (serum glucose 250). No evidence of urinary infection as nitrite negative, WBC: 0-2.   Will call to admit pt to observation for acute renal failure with an increased in Cr from 1.56 to 4.07 within 4 months. believed to be due to recent increased in NSAID use. Pt reports taking 600mg  ibuprofen every 6 hours for the last week for hip pain, despite her PCP advising her to stop taking this medication.    Discussed plan for admission with pt and her mother.  Pt and mother agreeable with plan. Pt also mentions how she has been holding her bladder for 24 hours due to hip pain, stating it was too painful for her to get up to use the bathroom. Mother states she now has a bedpan for pt to use.  Consider case management or social work consult during pt's admission as pt may need assistance with home health care needs.  PCP: Roe Coombs, PA-C [facility].   2:19 AM Consulted with Dr. Arnoldo Morale who agreed to have pt admitted to a med-surg bed. Temporary orders placed. Pt is stable at this time.    Noland Fordyce, PA-C 05/03/14  [id number]  Linton Flemings, MD 05/03/14 [phone]

## 2014-05-03 NOTE — H&P (Signed)
[facility] Admission History and Physical       [name] [id number] DOB: [birth year] DOA: 05/02/2014  Referring physician:  PCP: Aura Dials, PA-C  Specialists:   Chief Complaint: Left Hip Pain  HPI: [name] is a 49 y.o. female with a history of HIV Disease, IDDM, HTN and Asthma who presents the ED with complaints of severe left hip pain after falling 3 days ago.  She reports that she has been taking increased amounts of Tylenol and Ibuprofen for her pain.    She was evaluated in the ED and was found to have and elevated BUN/Cr of 30/4.07 from a [place] of 1.5.   Imaging of her Left  Hip was negative for fracture.    She was referred for admission.     Review of Systems:  Constitutional: No Weight Loss, No Weight Gain, Night Sweats, Fevers, Chills, Dizziness, Light Headedness, Fatigue, or Generalized Weakness HEENT: No Headaches, Difficulty Swallowing,Tooth/Dental Problems,Sore Throat,  No Sneezing, Rhinitis, Ear Ache, Nasal Congestion, or Post Nasal Drip,  Cardio-vascular:  No Chest pain, Orthopnea, PND, Edema in Lower Extremities, Anasarca, Dizziness, Palpitations  Resp: No Dyspnea, No DOE, No Productive Cough, No Non-Productive Cough, No Hemoptysis, No Wheezing.    GI: No Heartburn, Indigestion, Abdominal Pain, Nausea, Vomiting, Diarrhea, Constipation, Hematemesis, Hematochezia, Melena, Change in Bowel Habits,  Loss of Appetite  GU: No Dysuria, No Change in Color of Urine, No Urgency or Urinary Frequency, No Flank pain.  Musculoskeletal:  +Left Hip Pain, No Decreased Range of Motion, No Back Pain.  Neurologic: No Syncope, No Seizures, Muscle Weakness, Paresthesia, Vision Disturbance or Loss, No Diplopia, No Vertigo, No Difficulty Walking,  Skin: No Rash or Lesions. Psych: No Change in Mood or Affect, No Depression or Anxiety, No Memory loss, No Confusion, or Hallucinations   Past Medical History  Diagnosis Date  . Anxiety   . Depression   .  Hypertension   . HIV infection 2000  . Asthma     HOSPITALIZED WITH EXCERBATION OF ASTHMA - AND BRONCHITIS AND THE FLU DEC 2013  . Shortness of breath     ALLERGIES ARE "ACTING UP"  . Diabetes mellitus     ON INSULIN AND ORAL MEDICATIONS  . GERD (gastroesophageal reflux disease)   . Neuropathy     NEUROPATHY HANDS AND FEET  . Arthritis     LEFT HIP  . Pain     SEVERE PAIN LEFT HIP - HX OF LEFT HIP PINNING JULY 2013     Past Surgical History  Procedure Laterality Date  . Hip pinning,cannulated  07/16/2011    Procedure: CANNULATED HIP PINNING;  Surgeon: Gearlean Alf, MD;  Location: [facility];  Service: Orthopedics;  Laterality: Left;  . Hip pinning,cannulated Left 04/09/2012    Procedure: CANNULATED HIP PINNING AND HARDWARE REVISION;  Surgeon: Gearlean Alf, MD;  Location: [facility];  Service: Orthopedics;  Laterality: Left;      Prior to Admission medications   Medication Sig Start Date End Date Taking? Authorizing Provider  albuterol (PROVENTIL) (2.5 MG/3ML) 0.083% nebulizer solution Take 2.5 mg by nebulization every 6 (six) hours as needed for wheezing. For wheezing or shortness of breath 01/01/12  Yes Hosie Poisson, MD  ARIPiprazole (ABILIFY) 2 MG tablet Take 2 mg by mouth daily. Mental health   Yes Historical Provider, MD  atenolol (TENORMIN) 25 MG tablet Take 25 mg by mouth every morning.    Yes Historical Provider, MD  ATRIPLA 600-200-300 MG per tablet TAKE  1 TABLET BY MOUTH EVERY NIGHT AT BEDTIME 04/12/14  Yes Campbell Riches, MD  budesonide-formoterol [facility]) 80-4.5 MCG/ACT inhaler Inhale 2 puffs into the lungs 2 (two) times daily. 01/01/12  Yes Hosie Poisson, MD  Cholecalciferol (VITAMIN D3) 5000 UNITS TABS Take 1 tablet by mouth daily.   Yes Historical Provider, MD  cyclobenzaprine (FLEXERIL) 10 MG tablet Take 1 tablet (10 mg total) by mouth 2 (two) times daily as needed for muscle spasms. 11/24/13  Yes Evelina Bucy, MD  escitalopram (LEXAPRO) 20 MG tablet Take 20 mg by  mouth daily.   Yes Historical Provider, MD  eszopiclone (LUNESTA) 1 MG TABS tablet Take 1 mg by mouth at bedtime. Take immediately before bedtime   Yes Historical Provider, MD  Exenatide (BYETTA 10 MCG PEN [place]) Inject 10 Units into the skin 2 (two) times daily.   Yes Historical Provider, MD  fenofibrate (TRICOR) 145 MG tablet Take 1 tablet (145 mg total) by mouth daily. 01/06/13  Yes Campbell Riches, MD  furosemide (LASIX) 20 MG tablet Take 1 tablet (20 mg total) by mouth daily. Helps sodium level.  Take with potassium daily. 07/31/12  Yes Marianne L York, PA-C  insulin aspart (NOVOLOG) 100 UNIT/ML injection Inject 20 Units into the skin 3 (three) times daily before meals. Pt takes 20 units at each meals but sometimes may need more.   Yes Historical Provider, MD  Insulin Glargine 300 UNIT/ML SOPN Inject 70 Units into the skin 2 (two) times daily.   Yes Historical Provider, MD  metFORMIN (GLUCOPHAGE) 1000 MG tablet Take 1,000 mg by mouth 2 (two) times daily with a meal.   Yes Historical Provider, MD  methylphenidate (RITALIN) 20 MG tablet Take 20 mg by mouth 2 (two) times daily.   Yes Historical Provider, MD  Multiple Vitamins-Minerals (MULTIVITAMIN WITH MINERALS) tablet Take 1 tablet by mouth daily.   Yes Historical Provider, MD  pantoprazole (PROTONIX) 40 MG tablet Take 40 mg by mouth every morning.  07/13/10  Yes Lyndee Hensen, NP  pregabalin (LYRICA) 225 MG capsule Take 225 mg by mouth 2 (two) times daily.   Yes Historical Provider, MD  rosuvastatin (CRESTOR) 20 MG tablet Take 20 mg by mouth daily.   Yes Historical Provider, MD  sulfamethoxazole-trimethoprim (BACTRIM DS,SEPTRA DS) 800-160 MG per tablet Take 1 tablet by mouth 2 (two) times daily.   Yes Historical Provider, MD  traMADol (ULTRAM) 50 MG tablet Take 50 mg by mouth every 6 (six) hours as needed for pain.   Yes Historical Provider, MD  vitamin E (VITAMIN E) 1000 UNIT capsule Take 1,000 Units by mouth daily.    Yes Historical  Provider, MD  dextromethorphan-guaiFENesin (MUCINEX DM) 30-600 MG per 12 hr tablet Take 1 tablet by mouth 2 (two) times daily as needed (congestion). 01/01/12   Hosie Poisson, MD  diphenoxylate-atropine (LOMOTIL) 2.5-0.025 MG per tablet TAKE 1 TABLET BY MOUTH FOUR TIMES DAILY AS NEEDED FOR DIARRHEA/LOOSE STOOLS 03/24/14   Campbell Riches, MD  escitalopram (LEXAPRO) 5 MG tablet Take 5 mg by mouth daily. Mental health    Historical Provider, MD  HYDROcodone-acetaminophen (NORCO) 10-325 MG per tablet Take 1 tablet by mouth every 6 (six) hours as needed. 04/01/14   Campbell Riches, MD  itraconazole (SPORANOX) 10 MG/ML solution Take 10 mLs (100 mg total) by mouth daily. Swish and swallow Patient not taking: Reported on 05/02/2014 01/15/14   Campbell Riches, MD  potassium chloride (K-DUR) 10 MEQ tablet Take 1 tablet (10 mEq  total) by mouth daily. Take with lasix / furosemide Patient not taking: Reported on 05/02/2014 07/31/12   Melton Alar, PA-C     Allergies  Allergen Reactions  . Cortizone-10 [Hydrocortisone] Rash    Per patient    Social History:  reports that she has been smoking E-cigarettes.  She has a 17.5 pack-year smoking history. She has never used smokeless tobacco. She reports that she does not drink alcohol or use illicit drugs.    Family History  Problem Relation Age of Onset  . Diabetes Mother   . Hypertension Mother   . Vision loss Mother   . Hypertension Father        Physical Exam:  GEN:  Pleasant Obese 49 y.o. Caucasian female examined and in no acute distress; cooperative with exam Filed Vitals:   05/02/14 2105 05/02/14 2259 05/03/14 0103 05/03/14 0316  BP: 96/63 85/59 123/60 120/73  Pulse: 76 76 78 79  Temp: 98.4 F (36.9 C)   98.3 F (36.8 C)  TempSrc: Oral   Oral  Resp: 16  13 16   SpO2: 92% 98% 98% 100%   Blood pressure 120/73, pulse 79, temperature 98.3 F (36.8 C), temperature source Oral, resp. rate 16, last menstrual period 11/16/2010, SpO2 100  %. PSYCH: She is alert and oriented x4; does not appear anxious does not appear depressed; affect is normal HEENT: Normocephalic and Atraumatic, Mucous membranes pink; PERRLA; EOM intact; Fundi:  Benign;  No scleral icterus, Nares: Patent, Oropharynx: Clear, Fair Dentition,    Neck:  FROM, No Cervical Lymphadenopathy nor Thyromegaly or Carotid Bruit; No JVD; Breasts:: Not examined CHEST WALL: No tenderness CHEST: Normal respiration, clear to auscultation bilaterally HEART: Regular rate and rhythm; no murmurs rubs or gallops BACK: No kyphosis or scoliosis; No CVA tenderness ABDOMEN: Positive Bowel Sounds, Obese, Soft Non-Tender, No Rebound or Guarding; No Masses, No Organomegaly, No Pannus; No Intertriginous candida. Rectal Exam: Not done EXTREMITIES: No Cyanosis, Clubbing, or Edema; No Ulcerations. Genitalia: not examined PULSES: 2+ and symmetric SKIN: Normal hydration no rash or ulceration CNS:  Alert and Oriented x 4, No Focal Deficits Vascular: pulses palpable throughout    Labs on Admission:  Basic Metabolic Panel:  Recent Labs Lab 05/02/14 2318  NA 134*  K 3.5  CL 107  CO2 20*  GLUCOSE 250*  BUN 39*  CREATININE 4.07*  CALCIUM 8.1*   Liver Function Tests: No results for input(s): AST, ALT, ALKPHOS, BILITOT, PROT, ALBUMIN in the last 168 hours. No results for input(s): LIPASE, AMYLASE in the last 168 hours. No results for input(s): AMMONIA in the last 168 hours. CBC:  Recent Labs Lab 05/02/14 2318  WBC 11.3*  NEUTROABS 8.9*  HGB 9.1*  HCT 29.7*  MCV 90.0  PLT 249   Cardiac Enzymes: No results for input(s): CKTOTAL, CKMB, CKMBINDEX, TROPONINI in the last 168 hours.  BNP (last 3 results) No results for input(s): BNP in the last 8760 hours.  ProBNP (last 3 results) No results for input(s): PROBNP in the last 8760 hours.  CBG: No results for input(s): GLUCAP in the last 168 hours.  Radiological Exams on Admission: Dg Chest 2 View  05/03/2014   CLINICAL  DATA:  [place] 2 days ago  EXAM: CHEST  2 VIEW  COMPARISON:  11/24/2013  FINDINGS: There is unchanged borderline cardiomegaly. The lungs are clear. There are no pleural effusions. Hilar and mediastinal contours appear unremarkable and unchanged. No displaced fractures are evident.  IMPRESSION: No active cardiopulmonary disease.   Electronically  Signed   By: Andreas Newport M.D.   On: 05/03/2014 01:00   Dg Hips Bilat With Pelvis 2v  05/02/2014   CLINICAL DATA:  Fall and landed on right hip yesterday.  EXAM: BILATERAL HIP (WITH PELVIS) 2 VIEWS  COMPARISON:  Plain film 11/24/2013  FINDINGS: Internal screw fixation of left hip again noted. No fracture or dislocation of the left or right hip. No pelvic fracture or sacral fracture.  IMPRESSION: 1. No acute fracture of the left or right hip. 2. Internal fixation of left hip fracture again noted.   Electronically Signed   By: Suzy Bouchard M.D.   On: 05/02/2014 22:43     EKG: Independently reviewed.    Assessment/Plan:   49 y.o. female with  Pncipal Problem:   1.   ARF (acute renal failure)- Most likely due to NSAIDs   IVFs   Hold Bactrim, Lasix, and Metformin,    Monitor BUN/Cr    Previous Cr was 1.5      Active Problems:   2.   Left hip pain S/P Fall   Pain Control with IV Dilaudid        3.   Human immunodeficiency virus (HIV) disease   On [facility] Rx   Sees ID          4.   Diabetes mellitus type 2 with complications, uncontrolled   Continue Lantus   SSI coverage PRN     5.   Asthma   Albuterol Nebs PRN     6.   DVT Prophylaxis    SQ Heparin           Code Status:     FULL CODE      Family Communication:    No Family Present    Disposition Plan:    Inpatient Status        Time spent:  [facility] Pager [phone]   If [place] Please Contact the Day Rounding Team MD for [facility]  If 7PM-7AM, Please Contact Night-Floor Coverage  [email or web address] Password  [email or web address] 05/03/2014, 3:24 AM     ADDENDUM:   Patient was seen and examined on 05/03/2014

## 2014-05-03 NOTE — Progress Notes (Signed)
Patient seen and examined   agree with Theressa Millard, MD assessment and plan   Plan  #1 acute renal failure likely secondary to NSAID use, Bactrim, Lasix, metformin , continue gentle hydration, strict I's and O's, CT scan to rule out obstruction  #2 left hip pain inability to ambulate, CT abdomen pelvis to rule out FRACTURE  #3 diabetes , continue Lantus and sliding scale insulin  #4HIV -discussed with pharmacy to resume antiretroviral therapy based on renal dosing  PT/OT eval in the morning

## 2014-05-03 NOTE — ED Notes (Signed)
In and Out catheter completed by this Probation officer

## 2014-05-04 ENCOUNTER — Inpatient Hospital Stay (HOSPITAL_COMMUNITY): Payer: Medicare HMO

## 2014-05-04 DIAGNOSIS — R0600 Dyspnea, unspecified: Secondary | ICD-10-CM

## 2014-05-04 LAB — COMPREHENSIVE METABOLIC PANEL
ALT: 14 U/L (ref 14–54)
AST: 18 U/L (ref 15–41)
Albumin: 3 g/dL — ABNORMAL LOW (ref 3.5–5.0)
Alkaline Phosphatase: 169 U/L — ABNORMAL HIGH (ref 38–126)
Anion gap: 6 (ref 5–15)
BUN: 31 mg/dL — ABNORMAL HIGH (ref 6–20)
CALCIUM: 8.6 mg/dL — AB (ref 8.9–10.3)
CO2: 22 mmol/L (ref 22–32)
Chloride: 111 mmol/L (ref 101–111)
Creatinine, Ser: 2.7 mg/dL — ABNORMAL HIGH (ref 0.44–1.00)
GFR calc Af Amer: 23 mL/min — ABNORMAL LOW (ref >60)
GFR calc non Af Amer: 19 mL/min — ABNORMAL LOW (ref >60)
Glucose, Bld: 121 mg/dL — ABNORMAL HIGH (ref 70–99)
POTASSIUM: 3.4 mmol/L — AB (ref 3.5–5.1)
SODIUM: 139 mmol/L (ref 135–145)
Total Bilirubin: 0.5 mg/dL (ref 0.3–1.2)
Total Protein: 6.4 g/dL — ABNORMAL LOW (ref 6.5–8.1)

## 2014-05-04 LAB — GLUCOSE, CAPILLARY
GLUCOSE-CAPILLARY: 191 mg/dL — AB (ref 70–99)
GLUCOSE-CAPILLARY: 167 mg/dL — AB (ref 70–99)
Glucose-Capillary: 93 mg/dL (ref 70–99)
Glucose-Capillary: 138 mg/dL — ABNORMAL HIGH (ref 70–99)

## 2014-05-04 LAB — CBC
HCT: 29.3 % — ABNORMAL LOW (ref 36.0–46.0)
Hemoglobin: 8.9 g/dL — ABNORMAL LOW (ref 12.0–15.0)
MCH: 27.8 pg (ref 26.0–34.0)
MCHC: 30.4 g/dL (ref 30.0–36.0)
MCV: 91.6 fL (ref 78.0–100.0)
Platelets: 253 10*3/uL (ref 150–400)
RBC: 3.2 MIL/uL — AB (ref 3.87–5.11)
RDW: 21 % — AB (ref 11.5–15.5)
WBC: 8.7 10*3/uL (ref 4.0–10.5)

## 2014-05-04 LAB — HEMOGLOBIN A1C
Hgb A1c MFr Bld: 8.3 % — ABNORMAL HIGH (ref 4.8–5.6)
Mean Plasma Glucose: 192 mg/dL

## 2014-05-04 MED ORDER — PERFLUTREN LIPID MICROSPHERE
1.0000 mL | INTRAVENOUS | Status: AC | PRN
  Administered 2014-05-04: 1 mL via INTRAVENOUS
  Filled 2014-05-04: qty 10

## 2014-05-04 MED ORDER — EMTRICITABINE 200 MG PO CAPS
200.0000 mg | ORAL_CAPSULE | ORAL | Status: DC
  Administered 2014-05-04 – 2014-05-10 (×4): 200 mg via ORAL
  Filled 2014-05-04 (×5): qty 1

## 2014-05-04 MED ORDER — TENOFOVIR DISOPROXIL FUMARATE 300 MG PO TABS
300.0000 mg | ORAL_TABLET | ORAL | Status: DC
  Administered 2014-05-04 – 2014-05-10 (×4): 300 mg via ORAL
  Filled 2014-05-04 (×5): qty 1

## 2014-05-04 NOTE — Evaluation (Signed)
Physical Therapy Evaluation Patient Details Name: [name] MRN: [id number] DOB: 02-20-1965 Today's Date: 05/04/2014   History of Present Illness  49 y.o. female with h/o HIV, IDDM, HTN, peripheral neuropathy, L femoral neck ORIF July 2013 admitted with L hip pain resulting from a fall. Imaging negative for fx. ARF 2* NSAIDs use.  L ulnar styloid fx, has brace L wrist.   Clinical Impression  Pt admitted with above diagnosis. Pt currently with functional limitations due to the deficits listed below (see PT Problem List). Mod assist for supine to sit, +2 max assist to scoot from bed to recliner. L hip pain limiting activity tolerance. OT consult recommended to address L wrist fx. ST-SNF recommended, pt agreeable.  Pt will benefit from skilled PT to increase their independence and safety with mobility to allow discharge to the venue listed below.       Follow Up Recommendations Supervision/Assistance - 24 hour    Equipment Recommendations  Other (comment) (TBD, may need platform RW)    Recommendations for Other Services OT consult     Precautions / Restrictions Precautions Precautions: Fall Restrictions Weight Bearing Restrictions: Yes Other Position/Activity Restrictions: no WB status yet on LUE, assuming NWB for now, ortho being consulted per RN      Mobility  Bed Mobility Overal bed mobility: Needs Assistance Bed Mobility: Rolling;Sidelying to Sit Rolling: Mod assist Sidelying to sit: Mod assist       General bed mobility comments: mod A to support LLE, pt able to roll to R side and then required mod A to raise trunk with heavy use of RUE to assist pushing up  Transfers Overall transfer level: Needs assistance Equipment used: None Transfers: Lateral/Scoot Transfers          Lateral/Scoot Transfers: +2 physical assistance;Max assist General transfer comment: bed to recliner with arm dropped, max A using pad to scoot to R side to recliner, pt 50%, pt instructed to  be NWB on LUE, and self limited WB thru LLE 2* pain  Ambulation/Gait                Stairs            Wheelchair Mobility    Modified Rankin (Stroke Patients Only)       Balance Overall balance assessment: Needs assistance Sitting-balance support: Feet supported;Single extremity supported Sitting balance-Leahy Scale: Good Sitting balance - Comments: pt sat on EOB x 12 minutes without LOB                                     Pertinent Vitals/Pain Pain Assessment: 0-10 Pain Score: 10-Worst pain ever Pain Location: L hip with movement Pain Descriptors / Indicators: Sore Pain Intervention(s): Limited activity within patient's tolerance;Monitored during session;Premedicated before session    Home Living Family/patient expects to be discharged to:: Private residence Living Arrangements: Parent             Home Equipment: Environmental consultant - 2 wheels;Cane - quad;Wheelchair - manual;Bedside commode;Shower seat      Prior Function Level of Independence: Independent with assistive device(s)         Comments: uses cane with walking, stated she has significant leg length discrepancy from prior hip surgery and this causes frequent falls, pt stated she has a built up shoe and that's reduced falls     Hand Dominance        Extremity/Trunk Assessment  Upper Extremity Assessment: LUE deficits/detail       LUE Deficits / Details: edema in L fingers, pt has L wrist splint on, will request OT consult, instructed pt in L finger AROM    Lower Extremity Assessment: LLE deficits/detail   LLE Deficits / Details: knee extension +2/5, ankle WFL, LLE numb from knee down (this is baseline bilaterally), L hip AAROM limited by pain  Cervical / Trunk Assessment: Normal  Communication   Communication: No difficulties  Cognition Arousal/Alertness: Awake/alert Behavior During Therapy: WFL for tasks assessed/performed Overall Cognitive Status: Within Functional  Limits for tasks assessed                      General Comments      Exercises General Exercises - Upper Extremity Digit Composite Flexion: AROM;Left;10 reps;Seated General Exercises - Lower Extremity Long Arc Quad: AAROM;Left;10 reps;Seated      Assessment/Plan    PT Assessment Patient needs continued PT services  PT Diagnosis Difficulty walking;Acute pain   PT Problem List Decreased strength;Decreased activity tolerance;Pain;Decreased mobility;Obesity  PT Treatment Interventions Gait training;DME instruction;Functional mobility training;Therapeutic activities;Patient/family education;Therapeutic exercise   PT Goals (Current goals can be found in the Care Plan section) Acute Rehab PT Goals Patient Stated Goal: return to independence with mobility PT Goal Formulation: With patient Time For Goal Achievement: 05/18/14 Potential to Achieve Goals: Good    Frequency Min 3X/week   Barriers to discharge Decreased caregiver support pt lives with mother who is disabled 2* LE amputation    Co-evaluation               End of Session Equipment Utilized During Treatment: Gait belt Activity Tolerance: Patient limited by pain Patient left: in chair;with call bell/phone within reach Nurse Communication: Mobility status;Need for lift equipment         Time: [id number] PT Time Calculation (min) (ACUTE ONLY): 56 min   Charges:   PT Evaluation $Initial PT Evaluation Tier I: 1 Procedure PT Treatments $Therapeutic Exercise: 8-22 mins $Therapeutic Activity: 23-37 mins   PT G Codes:        Philomena Doheny 05/04/2014, 10:51 AM [phone]

## 2014-05-04 NOTE — Consult Note (Addendum)
Reason for Consult:Colles Fracture of Left Wrist and Pain in Left Hip. Referring Physician: Michelle Piper [name] is an 49 y.o. female.  HPI: Patient with Multiple Medical problems,and has had multiple falls. 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 She has two Femoral Pins in her Left Hip.  Past Medical History  Diagnosis Date  . Anxiety   . Depression   . Hypertension   . HIV infection 2000  . Asthma     HOSPITALIZED WITH EXCERBATION OF ASTHMA - AND BRONCHITIS AND THE FLU DEC 2013  . Shortness of breath     ALLERGIES ARE "ACTING UP"  . Diabetes mellitus     ON INSULIN AND ORAL MEDICATIONS  . GERD (gastroesophageal reflux disease)   . Neuropathy     NEUROPATHY HANDS AND FEET  . Arthritis     LEFT HIP  . Pain     SEVERE PAIN LEFT HIP - HX OF LEFT HIP PINNING JULY 2013    Past Surgical History  Procedure Laterality Date  . Hip pinning,cannulated  07/16/2011    Procedure: CANNULATED HIP PINNING;  Surgeon: Erin Alf, MD;  Location: [facility];  Service: Orthopedics;  Laterality: Left;  . Hip pinning,cannulated Left 04/09/2012    Procedure: CANNULATED HIP PINNING AND HARDWARE REVISION;  Surgeon: Erin Alf, MD;  Location: [facility];  Service: Orthopedics;  Laterality: Left;    Family History  Problem Relation Age of Onset  . Diabetes Mother   . Hypertension Mother   . Vision loss Mother   . Hypertension Father     Social History:  reports that she has been smoking E-cigarettes.  She has a 17.5 pack-year smoking history. She has never used smokeless tobacco. She reports that she does not drink alcohol or use illicit drugs.  Allergies:  Allergies  Allergen Reactions  . Cortizone-10 [Hydrocortisone] Rash    Per patient    Medications: I have reviewed the patient's current medications.  Results for orders placed or performed during the hospital encounter of 05/02/14 (from the past 48 hour(s))   CBC with Differential/Platelet     Status: Abnormal   Collection Time: 05/02/14 11:18 PM  Result Value Ref Range   WBC 11.3 (H) 4.0 - 10.5 K/uL   RBC 3.30 (L) 3.87 - 5.11 MIL/uL   Hemoglobin 9.1 (L) 12.0 - 15.0 g/dL   HCT 29.7 (L) 36.0 - 46.0 %   MCV 90.0 78.0 - 100.0 fL   MCH 27.6 26.0 - 34.0 pg   MCHC 30.6 30.0 - 36.0 g/dL   RDW 20.9 (H) 11.5 - 15.5 %   Platelets 249 150 - 400 K/uL   Neutrophils Relative % 79 (H) 43 - 77 %   Neutro Abs 8.9 (H) 1.7 - 7.7 K/uL   Lymphocytes Relative 13 12 - 46 %   Lymphs Abs 1.5 0.7 - 4.0 K/uL   Monocytes Relative 6 3 - 12 %   Monocytes Absolute 0.7 0.1 - 1.0 K/uL   Eosinophils Relative 2 0 - 5 %   Eosinophils Absolute 0.2 0.0 - 0.7 K/uL   Basophils Relative 0 0 - 1 %   Basophils Absolute 0.0 0.0 - 0.1 K/uL  Basic metabolic panel     Status: Abnormal   Collection Time: 05/02/14 11:18 PM  Result Value Ref Range   Sodium 134 (L) 135 - 145 mmol/L   Potassium 3.5 3.5 - 5.1 mmol/L  Chloride 107 101 - 111 mmol/L   CO2 20 (L) 22 - 32 mmol/L   Glucose, Bld 250 (H) 70 - 99 mg/dL   BUN 39 (H) 6 - 20 mg/dL   Creatinine, Ser 4.07 (H) 0.44 - 1.00 mg/dL   Calcium 8.1 (L) 8.9 - 10.3 mg/dL   GFR calc non Af Amer 12 (L) >60 mL/min   GFR calc Af Amer 14 (L) >60 mL/min    Comment: (NOTE) The eGFR has been calculated using the CKD EPI equation. This calculation has not been validated in all clinical situations. eGFR's persistently <90 mL/min signify possible Chronic Kidney Disease.    Anion gap 7 5 - 15  I-Stat CG4 Lactic Acid, ED     Status: None   Collection Time: 05/03/14 12:01 AM  Result Value Ref Range   Lactic Acid, Venous 0.78 0.5 - 2.0 mmol/L  Urinalysis, Routine w reflex microscopic     Status: Abnormal   Collection Time: 05/03/14  1:19 AM  Result Value Ref Range   Color, Urine YELLOW YELLOW   APPearance CLOUDY (A) CLEAR   Specific Gravity, Urine 1.021 1.005 - 1.030   pH 5.5 5.0 - 8.0   Glucose, UA >1000 (A) NEGATIVE mg/dL   Hgb urine  dipstick MODERATE (A) NEGATIVE   Bilirubin Urine NEGATIVE NEGATIVE   Ketones, ur NEGATIVE NEGATIVE mg/dL   Protein, ur 100 (A) NEGATIVE mg/dL   Urobilinogen, UA 0.2 0.0 - 1.0 mg/dL   Nitrite NEGATIVE NEGATIVE   Leukocytes, UA NEGATIVE NEGATIVE  Urine microscopic-add on     Status: Abnormal   Collection Time: 05/03/14  1:19 AM  Result Value Ref Range   Squamous Epithelial / LPF FEW (A) RARE   WBC, UA 0-2 <3 WBC/hpf   RBC / HPF 0-2 <3 RBC/hpf   Casts GRANULAR CAST (A) NEGATIVE   Urine-Other AMORPHOUS URATES/PHOSPHATES   Basic metabolic panel     Status: Abnormal   Collection Time: 05/03/14  5:35 AM  Result Value Ref Range   Sodium 139 135 - 145 mmol/L   Potassium 3.3 (L) 3.5 - 5.1 mmol/L   Chloride 107 101 - 111 mmol/L   CO2 22 22 - 32 mmol/L   Glucose, Bld 162 (H) 70 - 99 mg/dL   BUN 38 (H) 6 - 20 mg/dL   Creatinine, Ser 3.72 (H) 0.44 - 1.00 mg/dL   Calcium 8.5 (L) 8.9 - 10.3 mg/dL   GFR calc non Af Amer 13 (L) >60 mL/min   GFR calc Af Amer 15 (L) >60 mL/min    Comment: (NOTE) The eGFR has been calculated using the CKD EPI equation. This calculation has not been validated in all clinical situations. eGFR's persistently <90 mL/min signify possible Chronic Kidney Disease.    Anion gap 10 5 - 15  CBC     Status: Abnormal   Collection Time: 05/03/14  5:35 AM  Result Value Ref Range   WBC 10.4 4.0 - 10.5 K/uL   RBC 3.38 (L) 3.87 - 5.11 MIL/uL   Hemoglobin 9.2 (L) 12.0 - 15.0 g/dL   HCT 30.8 (L) 36.0 - 46.0 %   MCV 91.1 78.0 - 100.0 fL   MCH 27.2 26.0 - 34.0 pg   MCHC 29.9 (L) 30.0 - 36.0 g/dL   RDW 21.1 (H) 11.5 - 15.5 %   Platelets 251 150 - 400 K/uL  Hemoglobin A1c     Status: Abnormal   Collection Time: 05/03/14  6:11 AM  Result Value Ref Range  Hgb A1c MFr Bld 8.3 (H) 4.8 - 5.6 %    Comment: (NOTE)         Pre-diabetes: 5.7 - 6.4         Diabetes: >6.4         Glycemic control for adults with diabetes: <7.0    Mean Plasma Glucose 192 mg/dL    Comment:  (NOTE) Performed At: [facility] [place], [place] [id number] Lindon Romp MD JO:[id number]   Glucose, capillary     Status: Abnormal   Collection Time: 05/03/14  7:25 AM  Result Value Ref Range   Glucose-Capillary 170 (H) 70 - 99 mg/dL  Glucose, capillary     Status: Abnormal   Collection Time: 05/03/14 11:21 AM  Result Value Ref Range   Glucose-Capillary 151 (H) 70 - 99 mg/dL   Comment 1 Notify RN   Glucose, capillary     Status: Abnormal   Collection Time: 05/03/14  4:41 PM  Result Value Ref Range   Glucose-Capillary 213 (H) 70 - 99 mg/dL   Comment 1 Notify RN   Glucose, capillary     Status: Abnormal   Collection Time: 05/03/14  9:32 PM  Result Value Ref Range   Glucose-Capillary 202 (H) 70 - 99 mg/dL  CBC     Status: Abnormal   Collection Time: 05/04/14  5:42 AM  Result Value Ref Range   WBC 8.7 4.0 - 10.5 K/uL   RBC 3.20 (L) 3.87 - 5.11 MIL/uL   Hemoglobin 8.9 (L) 12.0 - 15.0 g/dL   HCT 29.3 (L) 36.0 - 46.0 %   MCV 91.6 78.0 - 100.0 fL   MCH 27.8 26.0 - 34.0 pg   MCHC 30.4 30.0 - 36.0 g/dL   RDW 21.0 (H) 11.5 - 15.5 %   Platelets 253 150 - 400 K/uL  Comprehensive metabolic panel     Status: Abnormal   Collection Time: 05/04/14  5:42 AM  Result Value Ref Range   Sodium 139 135 - 145 mmol/L   Potassium 3.4 (L) 3.5 - 5.1 mmol/L   Chloride 111 101 - 111 mmol/L   CO2 22 22 - 32 mmol/L   Glucose, Bld 121 (H) 70 - 99 mg/dL   BUN 31 (H) 6 - 20 mg/dL    Comment: REPEATED TO VERIFY   Creatinine, Ser 2.70 (H) 0.44 - 1.00 mg/dL    Comment: DELTA CHECK NOTED REPEATED TO VERIFY    Calcium 8.6 (L) 8.9 - 10.3 mg/dL   Total Protein 6.4 (L) 6.5 - 8.1 g/dL   Albumin 3.0 (L) 3.5 - 5.0 g/dL   AST 18 15 - 41 U/L   ALT 14 14 - 54 U/L   Alkaline Phosphatase 169 (H) 38 - 126 U/L   Total Bilirubin 0.5 0.3 - 1.2 mg/dL   GFR calc non Af Amer 19 (L) >60 mL/min   GFR calc Af Amer 23 (L) >60 mL/min    Comment: (NOTE) The eGFR has been calculated using the  CKD EPI equation. This calculation has not been validated in all clinical situations. eGFR's persistently <90 mL/min signify possible Chronic Kidney Disease.    Anion gap 6 5 - 15  Glucose, capillary     Status: None   Collection Time: 05/04/14  7:07 AM  Result Value Ref Range   Glucose-Capillary 93 70 - 99 mg/dL   Comment 1 Notify RN   Glucose, capillary     Status: Abnormal   Collection Time: 05/04/14 11:55  AM  Result Value Ref Range   Glucose-Capillary 191 (H) 70 - 99 mg/dL   Comment 1 Notify RN     Ct Abdomen Pelvis Wo Contrast  05/03/2014   CLINICAL DATA:  49 year old female with abdominal pain and distention. Evaluate for potential bowel obstruction.  EXAM: CT ABDOMEN AND PELVIS WITHOUT CONTRAST  TECHNIQUE: Multidetector CT imaging of the abdomen and pelvis was performed following the standard protocol without IV contrast.  COMPARISON:  No priors.  FINDINGS: Lower chest:  Unremarkable.  Hepatobiliary: Mild diffuse low attenuation throughout the hepatic parenchyma, compatible with mild hepatic steatosis. No definite cystic or solid hepatic lesions are identified on today's noncontrast CT examination. The unenhanced appearance of the gallbladder is normal.  Pancreas: Unremarkable.  Spleen: Unremarkable.  Adrenals/Urinary Tract: Bilateral adrenal glands are normal in appearance. Bilateral kidneys are normal in appearance. Incidental note is made of duplication of the right renal collecting system and the proximal half of the right ureter (normal anatomical variant). The unenhanced appearance of the urinary bladder is normal.  Stomach/Bowel: The unenhanced appearance of the stomach is normal. No pathologic dilatation of small bowel or colon to suggest bowel obstruction at this time. Normal appendix.  Vascular/Lymphatic: Atherosclerosis throughout the abdominal and pelvic vasculature, without definite aneurysm. No lymphadenopathy noted in the abdomen or pelvis on today's noncontrast CT  examination.  Reproductive: Unremarkable.  Other: No significant volume of ascites.  No pneumoperitoneum.  Musculoskeletal: Postoperative changes of ORIF for again noted in the left femoral neck where there are 2 cannulated fixation screws traversing an intertrochanteric hip fracture. The fracture line is still well visualized. There are no aggressive appearing lytic or blastic lesions noted in the visualized portions of the skeleton.  IMPRESSION: 1. No acute findings in the abdomen or pelvis to account for the patient's symptoms. Specifically, no signs of bowel obstruction are noted at this time. 2. Normal appendix. 3. Atherosclerosis. 4. Status post ORIF in the left hip where there is a healing intertrochanteric hip fracture. 5. Mild hepatic steatosis. 6. Additional incidental findings, as above.   Electronically Signed   By: Vinnie Langton M.D.   On: 05/03/2014 18:35   Dg Chest 2 View  05/03/2014   CLINICAL DATA:  [place] 2 days ago  EXAM: CHEST  2 VIEW  COMPARISON:  11/24/2013  FINDINGS: There is unchanged borderline cardiomegaly. The lungs are clear. There are no pleural effusions. Hilar and mediastinal contours appear unremarkable and unchanged. No displaced fractures are evident.  IMPRESSION: No active cardiopulmonary disease.   Electronically Signed   By: Andreas Newport M.D.   On: 05/03/2014 01:00   Dg Wrist 2 Views Left  05/03/2014   CLINICAL DATA:  [place] last Wednesday injuring wrist trying to brace fall, pain swelling and bruising throughout LEFT wrist, initial encounter  EXAM: LEFT WRIST - 2 VIEW  COMPARISON:  Portable exam 1430 hours without priors for comparison.  FINDINGS: Diffuse osseous demineralization.  Transverse fracture distal LEFT radial metaphysis with dorsal tilt of distal radial articular surface and minimal displacement.  No definite intra-articular extension  Nondisplaced ulnar styloid fracture at tip.  Regional soft tissue swelling.  No additional fracture, dislocation or bone  destruction.  IMPRESSION: Nondisplaced ulnar styloid fracture.  Angulated and minimally displaced distal LEFT radial metaphyseal fracture.   Electronically Signed   By: Lavonia Dana M.D.   On: 05/03/2014 14:55   Dg Hips Bilat With Pelvis 2v  05/02/2014   CLINICAL DATA:  Fall and landed on right hip yesterday.  EXAM: BILATERAL HIP (WITH PELVIS) 2 VIEWS  COMPARISON:  Plain film 11/24/2013  FINDINGS: Internal screw fixation of left hip again noted. No fracture or dislocation of the left or right hip. No pelvic fracture or sacral fracture.  IMPRESSION: 1. No acute fracture of the left or right hip. 2. Internal fixation of left hip fracture again noted.   Electronically Signed   By: Suzy Bouchard M.D.   On: 05/02/2014 22:43    Review of Systems  HENT: Negative.   Eyes: Negative.   Respiratory: Negative.   Cardiovascular: Negative.   Gastrointestinal: Negative.   Genitourinary: Negative.   Musculoskeletal: Positive for joint pain and falls.  Skin: Negative.   Neurological: Positive for weakness.  Endo/Heme/Allergies: Negative.   Psychiatric/Behavioral: Negative.    Blood pressure 108/48, pulse 69, temperature 98.2 F (36.8 C), temperature source Oral, resp. rate 20, height _0  (1.753 m), weight 106.1 kg (233 lb 14.5 oz), last menstrual period 11/16/2010, SpO2 94 %. Physical Exam  Constitutional: She appears well-nourished.  HENT:  Head: Normocephalic.  Eyes: Pupils are equal, round, and reactive to light.  Neck: Normal range of motion.  Cardiovascular: Normal rate.   Respiratory: Effort normal.  GI: Soft.  Musculoskeletal:  Colles Fracture on the Left. Left Hip Pain.  Neurological: She is alert.  Skin: Skin is warm.  Psychiatric: She has a normal mood and affect.    Assessment/Plan: Will require a Closed Reduction of her Left Colles Fracture and a Unipolar Prosthesis if she does obtain Medical Clearance.She on HEPARIN  [name] A 05/04/2014, 4:01 PM

## 2014-05-04 NOTE — Progress Notes (Addendum)
[facility] PROGRESS NOTE  AURORA LAFAYETTE [id number] DOB: [birth year] DOA: 05/02/2014 PCP: Aura Dials, PA-C  Assessment/Plan: Principal Problem:   ARF (acute renal failure) Active Problems:   Human immunodeficiency virus (HIV) disease   Diabetes mellitus type 2 with complications, uncontrolled   Left hip pain   Asthma   Hip pain   #1 acute renal failure likely secondary to NSAID use, Bactrim, Lasix, metformin , continue gentle hydration, strict I's and O's, CT scan to rule out obstruction , improving  Slowly ,2250 cc uop in 2 4 hrs   #2 left hip pain inability to ambulate, CT abdomen pelvis shows Status post ORIF in the left hip where there is a healing intertrochanteric hip fracture, requested Dr Elmyra Ricks to review CT scan and make final recommendations, also obtain MRI of the lumbar spine to rule out lumbar spinal stenosis, he also recommends CT of the hip ,moderate to high risk for surgery given nicotine abuse, morbid obesity,HTN, diabetes, but patient states that "she would do anything to walk again" and is willing to take the risk Will order preop ECHO   #3 diabetes , continue Lantus and sliding scale insulin, hemoglobin A1c is 8.3  #4HIV -discussed with pharmacy to resume antiretroviral therapy based on renal dosing   PT/OT eval in the morning   Code Status: full Family Communication: family updated about patient's clinical progress Disposition Plan:  As above    Brief narrative: [name] is a 49 y.o. female with a history of HIV Disease, IDDM, HTN and Asthma who presents the ED with complaints of severe left hip pain after falling 3 days ago. She reports that she has been taking increased amounts of Tylenol and Ibuprofen for her pain. She was evaluated in the ED and was found to have and elevated BUN/Cr of 30/4.07 from a [place] of 1.5. Imaging of her Left Hip was negative for fracture. She was referred for admission.    Consultants:  Orthopedics   Procedures:  None   Antibiotics: None   HPI/Subjective: Continues to have left hip pain, inability to ambulate  Objective: Filed Vitals:   05/03/14 2108 05/04/14 0220 05/04/14 0521 05/04/14 0703  BP: 115/59 99/69 128/109 141/60  Pulse: 80 74 76 75  Temp: 97.8 F (36.6 C) 98.7 F (37.1 C) 97.7 F (36.5 C)   TempSrc: Oral Oral Oral   Resp: 20 20 20    Height:      Weight:      SpO2: 98% 94% 97%     Intake/Output Summary (Last 24 hours) at 05/04/14 0957 Last data filed at 05/04/14 [id number]  Gross per 24 hour  Intake   1290 ml  Output   1750 ml  Net   -460 ml    Exam:  General: No acute respiratory distress Lungs: Clear to auscultation bilaterally without wheezes or crackles Cardiovascular: Regular rate and rhythm without murmur gallop or rub normal S1 and S2 Abdomen: Nontender, nondistended, soft, bowel sounds positive, no rebound, no ascites, no appreciable mass Extremities: No significant cyanosis, clubbing, or edema bilateral lower extremities      Data Reviewed: Basic Metabolic Panel:  Recent Labs Lab 05/02/14 2318 05/03/14 0535 05/04/14 0542  NA 134* 139 139  K 3.5 3.3* 3.4*  CL 107 107 111  CO2 20* 22 22  GLUCOSE 250* 162* 121*  BUN 39* 38* 31*  CREATININE 4.07* 3.72* 2.70*  CALCIUM 8.1* 8.5* 8.6*    Liver Function Tests:  Recent Labs Lab 05/04/14 0542  AST 18  ALT 14  ALKPHOS 169*  BILITOT 0.5  PROT 6.4*  ALBUMIN 3.0*   No results for input(s): LIPASE, AMYLASE in the last 168 hours. No results for input(s): AMMONIA in the last 168 hours.  CBC:  Recent Labs Lab 05/02/14 2318 05/03/14 0535 05/04/14 0542  WBC 11.3* 10.4 8.7  NEUTROABS 8.9*  --   --   HGB 9.1* 9.2* 8.9*  HCT 29.7* 30.8* 29.3*  MCV 90.0 91.1 91.6  PLT 249 251 253    Cardiac Enzymes: No results for input(s): CKTOTAL, CKMB, CKMBINDEX, TROPONINI in the last 168 hours. BNP (last 3 results) No results for input(s): BNP in the  last 8760 hours.  ProBNP (last 3 results) No results for input(s): PROBNP in the last 8760 hours.    CBG:  Recent Labs Lab 05/03/14 0725 05/03/14 1121 05/03/14 1641 05/03/14 2132 05/04/14 0707  GLUCAP 170* 151* 213* 202* 93    No results found for this or any previous visit (from the past 240 hour(s)).   Studies: Ct Abdomen Pelvis Wo Contrast  05/03/2014   CLINICAL DATA:  49 year old female with abdominal pain and distention. Evaluate for potential bowel obstruction.  EXAM: CT ABDOMEN AND PELVIS WITHOUT CONTRAST  TECHNIQUE: Multidetector CT imaging of the abdomen and pelvis was performed following the standard protocol without IV contrast.  COMPARISON:  No priors.  FINDINGS: Lower chest:  Unremarkable.  Hepatobiliary: Mild diffuse low attenuation throughout the hepatic parenchyma, compatible with mild hepatic steatosis. No definite cystic or solid hepatic lesions are identified on today's noncontrast CT examination. The unenhanced appearance of the gallbladder is normal.  Pancreas: Unremarkable.  Spleen: Unremarkable.  Adrenals/Urinary Tract: Bilateral adrenal glands are normal in appearance. Bilateral kidneys are normal in appearance. Incidental note is made of duplication of the right renal collecting system and the proximal half of the right ureter (normal anatomical variant). The unenhanced appearance of the urinary bladder is normal.  Stomach/Bowel: The unenhanced appearance of the stomach is normal. No pathologic dilatation of small bowel or colon to suggest bowel obstruction at this time. Normal appendix.  Vascular/Lymphatic: Atherosclerosis throughout the abdominal and pelvic vasculature, without definite aneurysm. No lymphadenopathy noted in the abdomen or pelvis on today's noncontrast CT examination.  Reproductive: Unremarkable.  Other: No significant volume of ascites.  No pneumoperitoneum.  Musculoskeletal: Postoperative changes of ORIF for again noted in the left femoral neck where  there are 2 cannulated fixation screws traversing an intertrochanteric hip fracture. The fracture line is still well visualized. There are no aggressive appearing lytic or blastic lesions noted in the visualized portions of the skeleton.  IMPRESSION: 1. No acute findings in the abdomen or pelvis to account for the patient's symptoms. Specifically, no signs of bowel obstruction are noted at this time. 2. Normal appendix. 3. Atherosclerosis. 4. Status post ORIF in the left hip where there is a healing intertrochanteric hip fracture. 5. Mild hepatic steatosis. 6. Additional incidental findings, as above.   Electronically Signed   By: Vinnie Langton M.D.   On: 05/03/2014 18:35   Dg Chest 2 View  05/03/2014   CLINICAL DATA:  [place] 2 days ago  EXAM: CHEST  2 VIEW  COMPARISON:  11/24/2013  FINDINGS: There is unchanged borderline cardiomegaly. The lungs are clear. There are no pleural effusions. Hilar and mediastinal contours appear unremarkable and unchanged. No displaced fractures are evident.  IMPRESSION: No active cardiopulmonary disease.   Electronically Signed   By: Andreas Newport M.D.   On: 05/03/2014  01:00   Dg Wrist 2 Views Left  05/03/2014   CLINICAL DATA:  [place] last Wednesday injuring wrist trying to brace fall, pain swelling and bruising throughout LEFT wrist, initial encounter  EXAM: LEFT WRIST - 2 VIEW  COMPARISON:  Portable exam 1430 hours without priors for comparison.  FINDINGS: Diffuse osseous demineralization.  Transverse fracture distal LEFT radial metaphysis with dorsal tilt of distal radial articular surface and minimal displacement.  No definite intra-articular extension  Nondisplaced ulnar styloid fracture at tip.  Regional soft tissue swelling.  No additional fracture, dislocation or bone destruction.  IMPRESSION: Nondisplaced ulnar styloid fracture.  Angulated and minimally displaced distal LEFT radial metaphyseal fracture.   Electronically Signed   By: Lavonia Dana M.D.   On: 05/03/2014  14:55   Dg Hips Bilat With Pelvis 2v  05/02/2014   CLINICAL DATA:  Fall and landed on right hip yesterday.  EXAM: BILATERAL HIP (WITH PELVIS) 2 VIEWS  COMPARISON:  Plain film 11/24/2013  FINDINGS: Internal screw fixation of left hip again noted. No fracture or dislocation of the left or right hip. No pelvic fracture or sacral fracture.  IMPRESSION: 1. No acute fracture of the left or right hip. 2. Internal fixation of left hip fracture again noted.   Electronically Signed   By: Suzy Bouchard M.D.   On: 05/02/2014 22:43    Scheduled Meds: . ARIPiprazole  2 mg Oral Daily  . atenolol  25 mg Oral q morning - 10a  . cholecalciferol  5,000 Units Oral Daily  . efavirenz  600 mg Oral QHS  . [START ON 05/05/2014] emtricitabine  200 mg Oral Q72H  . escitalopram  5 mg Oral Daily  . fenofibrate  160 mg Oral Daily  . heparin  5,000 Units Subcutaneous 3 times per day  . insulin aspart  0-15 Units Subcutaneous TID WC  . insulin aspart  0-5 Units Subcutaneous QHS  . insulin glargine  35 Units Subcutaneous BID  . pantoprazole  40 mg Oral q morning - 10a  . pregabalin  225 mg Oral BID  . rosuvastatin  20 mg Oral Daily  . [START ON 05/05/2014] tenofovir  300 mg Oral Q72H   Continuous Infusions: . 0.9 % NaCl with KCl 20 mEq / L 75 mL/hr at 05/03/14 1500    Principal Problem:   ARF (acute renal failure) Active Problems:   Human immunodeficiency virus (HIV) disease   Diabetes mellitus type 2 with complications, uncontrolled   Left hip pain   Asthma   Hip pain    Time spent: 40 minutes   [facility] Pager [phone]. If 7PM-7AM, please contact night-coverage at [email or web address], password [facility] 05/04/2014, 9:57 AM  LOS: 1 day

## 2014-05-04 NOTE — Clinical Social Work Note (Signed)
Clinical Social Work Assessment  Patient Details  Name: [name] MRN: [id number] Date of Birth: 01/29/1965  Date of referral:  05/04/14               Reason for consult:  Facility Placement                Permission sought to share information with:  Chartered certified accountant granted to share information::  Yes, Verbal Permission Granted  Name::        Agency::  [facility]  Relationship::     Contact Information:     Housing/Transportation Living arrangements for the past 2 months:  [place] of Information:  Patient Patient Interpreter Needed:  None Criminal Activity/Legal Involvement Pertinent to Current Situation/Hospitalization:  No - Comment as needed Significant Relationships:  Parents Lives with:  Parents Do you feel safe going back to the place where you live?  Yes Need for family participation in patient care:  No (Coment)  Care giving concerns:  Pt lives with elderly mother who is unable to provide the extra help needed at this time.   Social Worker assessment / plan:  CSW introduced self and explained role. CSW spent time with Pt  discussing events that led to hospitalization. CSW encouraged Pt to explore her feelings related to SNF placement for short term rehab as well as concerns related to returning home. CSW faxed out Pt referral to [facility]. SNF's and will provide Pt with list of facilities who offer beds. Will continue to facilitate DC to SNF.   Employment status:  Disabled (currently receiving disability) Insurance information:  Managed Care PT Recommendations:  [place] / Referral to community resources:  [place]  Patient/Family's Response to care:  Pt is agreeable to SNF placement. Requesting [facility] as she has had a good experience with her mother there.  Patient/Family's Understanding of and Emotional Response to Diagnosis, Current Treatment, and  Prognosis:  Pt reported feeling scared to go to a SNF as it would be a new environment for her; however, she was accepting of this as she feels it is imperative for her to receive rehab before returning home. She recognizes that her decreased ability to function would be a safety risk for her and her elderly mother in the home. Pt remained apprehensive but was agreeable to SNF placement. She appeared to be in a good deal of pain AEB her wincing facial expressions and groaning. She also appeared to be lethargic, most likely due to pain medication.   Emotional Assessment Appearance:  Appears stated age, Disheveled Attitude/Demeanor/Rapport:   (Appropriate and cooperative) Affect (typically observed):  Appropriate (Pt wincing due to pain in back, hip, and arm); lethargic Orientation:  Oriented to Self, Oriented to Place, Oriented to  Time, Oriented to Situation Alcohol / Substance use:  Not Applicable Psych involvement (Current and /or in the community):  No (Comment)  Discharge Needs  Concerns to be addressed:  No discharge needs identified, Denies Needs/Concerns at this time Readmission within the last 30 days:  No Current discharge risk:  None Barriers to Discharge:  No Barriers Identified   Bo Mcclintock, LCSW 05/04/2014, 2:14 PM

## 2014-05-04 NOTE — Progress Notes (Signed)
Echocardiogram 2D Echocardiogram with Definity has been performed.  Tresa Res 05/04/2014, 4:41 PM

## 2014-05-05 ENCOUNTER — Encounter (HOSPITAL_COMMUNITY)
Admission: EM | Disposition: A | Discharge status: Skilled Nursing Facility | Payer: Self-pay | Source: Home / Self Care | Attending: Internal Medicine

## 2014-05-05 ENCOUNTER — Inpatient Hospital Stay (HOSPITAL_COMMUNITY): Payer: Medicare HMO | Admitting: Anesthesiology

## 2014-05-05 ENCOUNTER — Inpatient Hospital Stay (HOSPITAL_COMMUNITY): Payer: Medicare HMO

## 2014-05-05 HISTORY — PX: HIP ARTHROPLASTY: SHX981

## 2014-05-05 HISTORY — PX: CAST APPLICATION: SHX380

## 2014-05-05 HISTORY — PX: HARDWARE REMOVAL: SHX979

## 2014-05-05 LAB — GLUCOSE, CAPILLARY
GLUCOSE-CAPILLARY: 81 mg/dL (ref 70–99)
GLUCOSE-CAPILLARY: 65 mg/dL — AB (ref 70–99)
GLUCOSE-CAPILLARY: 118 mg/dL — AB (ref 70–99)
Glucose-Capillary: 123 mg/dL — ABNORMAL HIGH (ref 70–99)
Glucose-Capillary: 64 mg/dL — ABNORMAL LOW (ref 70–99)
Glucose-Capillary: 64 mg/dL — ABNORMAL LOW (ref 70–99)
Glucose-Capillary: 43 mg/dL — CL (ref 70–99)
Glucose-Capillary: 112 mg/dL — ABNORMAL HIGH (ref 70–99)
Glucose-Capillary: 63 mg/dL — ABNORMAL LOW (ref 70–99)
Glucose-Capillary: 136 mg/dL — ABNORMAL HIGH (ref 70–99)

## 2014-05-05 LAB — COMPREHENSIVE METABOLIC PANEL
ALBUMIN: 2.8 g/dL — AB (ref 3.5–5.0)
ALT: 14 U/L (ref 14–54)
ANION GAP: 8 (ref 5–15)
AST: 15 U/L (ref 15–41)
Alkaline Phosphatase: 157 U/L — ABNORMAL HIGH (ref 38–126)
BUN: 28 mg/dL — ABNORMAL HIGH (ref 6–20)
CALCIUM: 8.8 mg/dL — AB (ref 8.9–10.3)
CO2: 22 mmol/L (ref 22–32)
Chloride: 112 mmol/L — ABNORMAL HIGH (ref 101–111)
Creatinine, Ser: 2.29 mg/dL — ABNORMAL HIGH (ref 0.44–1.00)
GFR calc Af Amer: 27 mL/min — ABNORMAL LOW (ref >60)
GFR calc non Af Amer: 24 mL/min — ABNORMAL LOW (ref >60)
GLUCOSE: 107 mg/dL — AB (ref 70–99)
Potassium: 3.6 mmol/L (ref 3.5–5.1)
SODIUM: 142 mmol/L (ref 135–145)
Total Bilirubin: 0.3 mg/dL (ref 0.3–1.2)
Total Protein: 6.2 g/dL — ABNORMAL LOW (ref 6.5–8.1)

## 2014-05-05 LAB — CBC
HEMATOCRIT: 29.5 % — AB (ref 36.0–46.0)
Hemoglobin: 9 g/dL — ABNORMAL LOW (ref 12.0–15.0)
MCH: 27.8 pg (ref 26.0–34.0)
MCHC: 30.5 g/dL (ref 30.0–36.0)
MCV: 91 fL (ref 78.0–100.0)
Platelets: 275 10*3/uL (ref 150–400)
RBC: 3.24 MIL/uL — ABNORMAL LOW (ref 3.87–5.11)
RDW: 20.6 % — ABNORMAL HIGH (ref 11.5–15.5)
WBC: 9.4 10*3/uL (ref 4.0–10.5)

## 2014-05-05 LAB — PREPARE RBC (CROSSMATCH)

## 2014-05-05 LAB — HEMOGLOBIN AND HEMATOCRIT, BLOOD
HCT: 27.9 % — ABNORMAL LOW (ref 36.0–46.0)
Hemoglobin: 8.5 g/dL — ABNORMAL LOW (ref 12.0–15.0)

## 2014-05-05 LAB — SURGICAL PCR SCREEN
MRSA, PCR: NEGATIVE
Staphylococcus aureus: NEGATIVE

## 2014-05-05 SURGERY — Surgical Case
Anesthesia: *Unknown

## 2014-05-05 SURGERY — HEMIARTHROPLASTY, HIP, DIRECT ANTERIOR APPROACH, FOR FRACTURE
Anesthesia: General | Site: Wrist | Laterality: Left

## 2014-05-05 MED ORDER — FENTANYL CITRATE (PF) 100 MCG/2ML IJ SOLN
INTRAMUSCULAR | Status: AC
Start: 2014-05-05 — End: 2014-05-05
  Filled 2014-05-05: qty 2

## 2014-05-05 MED ORDER — SODIUM CHLORIDE 0.9 % IR SOLN
Status: DC | PRN
  Administered 2014-05-05: 500 mL

## 2014-05-05 MED ORDER — FENTANYL CITRATE (PF) 100 MCG/2ML IJ SOLN
25.0000 ug | INTRAMUSCULAR | Status: DC | PRN
  Administered 2014-05-05 (×2): 50 ug via INTRAVENOUS

## 2014-05-05 MED ORDER — DEXTROSE 50 % IV SOLN
25.0000 mL | Freq: Once | INTRAVENOUS | Status: DC
  Filled 2014-05-05: qty 50

## 2014-05-05 MED ORDER — HYDROMORPHONE HCL 2 MG/ML IJ SOLN
INTRAMUSCULAR | Status: AC
  Filled 2014-05-05: qty 1

## 2014-05-05 MED ORDER — CEFAZOLIN SODIUM-DEXTROSE 2-3 GM-% IV SOLR
2.0000 g | Freq: Once | INTRAVENOUS | Status: AC
  Administered 2014-05-05: 2 g via INTRAVENOUS

## 2014-05-05 MED ORDER — SODIUM CHLORIDE 0.9 % IV SOLN
Freq: Once | INTRAVENOUS | Status: AC
  Administered 2014-05-05: 22:00:00 via INTRAVENOUS

## 2014-05-05 MED ORDER — SODIUM CHLORIDE 0.9 % IJ SOLN
INTRAMUSCULAR | Status: AC
  Filled 2014-05-05: qty 20

## 2014-05-05 MED ORDER — PROPOFOL 10 MG/ML IV BOLUS
INTRAVENOUS | Status: DC | PRN
  Administered 2014-05-05: 150 mg via INTRAVENOUS

## 2014-05-05 MED ORDER — NEOSTIGMINE METHYLSULFATE 10 MG/10ML IV SOLN
INTRAVENOUS | Status: DC | PRN
  Administered 2014-05-05: 3 mg via INTRAVENOUS

## 2014-05-05 MED ORDER — SUCCINYLCHOLINE CHLORIDE 20 MG/ML IJ SOLN
INTRAMUSCULAR | Status: DC | PRN
  Administered 2014-05-05: 100 mg via INTRAVENOUS

## 2014-05-05 MED ORDER — HYDROMORPHONE HCL 1 MG/ML IJ SOLN
1.0000 mg | INTRAMUSCULAR | Status: DC | PRN
  Administered 2014-05-05 – 2014-05-12 (×33): 1 mg via INTRAVENOUS
  Filled 2014-05-05 (×34): qty 1

## 2014-05-05 MED ORDER — DEXTROSE 5 % IV SOLN
INTRAVENOUS | Status: DC | PRN
  Administered 2014-05-05: 16:00:00 via INTRAVENOUS

## 2014-05-05 MED ORDER — ONDANSETRON HCL 4 MG/2ML IJ SOLN
INTRAMUSCULAR | Status: DC | PRN
  Administered 2014-05-05: 4 mg via INTRAVENOUS

## 2014-05-05 MED ORDER — THROMBIN 5000 UNITS EX SOLR
CUTANEOUS | Status: AC
  Filled 2014-05-05: qty 5000

## 2014-05-05 MED ORDER — ALBUMIN HUMAN 5 % IV SOLN
INTRAVENOUS | Status: DC | PRN
  Administered 2014-05-05: 17:00:00 via INTRAVENOUS

## 2014-05-05 MED ORDER — BACITRACIN ZINC 500 UNIT/GM EX OINT
TOPICAL_OINTMENT | CUTANEOUS | Status: AC
  Filled 2014-05-05: qty 28.35

## 2014-05-05 MED ORDER — BUPIVACAINE-EPINEPHRINE (PF) 0.5% -1:200000 IJ SOLN
INTRAMUSCULAR | Status: AC
Start: 2014-05-05 — End: 2014-05-05
  Filled 2014-05-05: qty 30

## 2014-05-05 MED ORDER — OXYCODONE HCL 5 MG PO TABS
5.0000 mg | ORAL_TABLET | ORAL | Status: DC | PRN
  Administered 2014-05-05: 5 mg via ORAL
  Administered 2014-05-06 (×3): 15 mg via ORAL
  Administered 2014-05-07: 10 mg via ORAL
  Administered 2014-05-08 (×2): 15 mg via ORAL
  Administered 2014-05-09: 10 mg via ORAL
  Administered 2014-05-09 – 2014-05-11 (×5): 15 mg via ORAL
  Administered 2014-05-11: 10 mg via ORAL
  Administered 2014-05-11 – 2014-05-12 (×7): 15 mg via ORAL
  Filled 2014-05-05 (×4): qty 3
  Filled 2014-05-05: qty 2
  Filled 2014-05-05 (×3): qty 3
  Filled 2014-05-05 (×2): qty 2
  Filled 2014-05-05: qty 3
  Filled 2014-05-05: qty 2
  Filled 2014-05-05 (×5): qty 3
  Filled 2014-05-05: qty 2
  Filled 2014-05-05 (×5): qty 3

## 2014-05-05 MED ORDER — INSULIN GLARGINE 100 UNIT/ML ~~LOC~~ SOLN
35.0000 [IU] | Freq: Every day | SUBCUTANEOUS | Status: DC
  Administered 2014-05-06 – 2014-05-12 (×7): 35 [IU] via SUBCUTANEOUS
  Filled 2014-05-05 (×7): qty 0.35

## 2014-05-05 MED ORDER — MEPERIDINE HCL 50 MG/ML IJ SOLN: 6.2500 mg | INTRAMUSCULAR | Status: DC | PRN

## 2014-05-05 MED ORDER — HYDROMORPHONE HCL 1 MG/ML IJ SOLN
INTRAMUSCULAR | Status: DC | PRN
  Administered 2014-05-05 (×2): 0.5 mg via INTRAVENOUS

## 2014-05-05 MED ORDER — PROMETHAZINE HCL 25 MG/ML IJ SOLN: 6.2500 mg | INTRAMUSCULAR | Status: DC | PRN

## 2014-05-05 MED ORDER — ENOXAPARIN SODIUM 40 MG/0.4ML ~~LOC~~ SOLN
40.0000 mg | SUBCUTANEOUS | Status: DC
  Administered 2014-05-06 – 2014-05-12 (×7): 40 mg via SUBCUTANEOUS
  Filled 2014-05-05 (×7): qty 0.4

## 2014-05-05 MED ORDER — BUPIVACAINE LIPOSOME 1.3 % IJ SUSP
20.0000 mL | Freq: Once | INTRAMUSCULAR | Status: AC
Start: 2014-05-05 — End: 2014-05-05
  Administered 2014-05-05: 20 mL
  Filled 2014-05-05: qty 20

## 2014-05-05 MED ORDER — METHOCARBAMOL 500 MG PO TABS
500.0000 mg | ORAL_TABLET | Freq: Four times a day (QID) | ORAL | Status: DC | PRN
  Administered 2014-05-06 – 2014-05-12 (×8): 500 mg via ORAL
  Filled 2014-05-05 (×9): qty 1

## 2014-05-05 MED ORDER — KCL IN DEXTROSE-NACL 20-5-0.9 MEQ/L-%-% IV SOLN
INTRAVENOUS | Status: DC
  Administered 2014-05-06 – 2014-05-08 (×3): via INTRAVENOUS
  Filled 2014-05-05 (×6): qty 1000

## 2014-05-05 MED ORDER — FENTANYL CITRATE (PF) 250 MCG/5ML IJ SOLN
INTRAMUSCULAR | Status: AC
  Filled 2014-05-05: qty 5

## 2014-05-05 MED ORDER — CISATRACURIUM BESYLATE (PF) 10 MG/5ML IV SOLN
INTRAVENOUS | Status: DC | PRN
  Administered 2014-05-05: 4 mg via INTRAVENOUS

## 2014-05-05 MED ORDER — THROMBIN 5000 UNITS EX SOLR
CUTANEOUS | Status: AC
Start: 2014-05-05 — End: 2014-05-05
  Filled 2014-05-05: qty 5000

## 2014-05-05 MED ORDER — GLYCOPYRROLATE 0.2 MG/ML IJ SOLN
INTRAMUSCULAR | Status: DC | PRN
  Administered 2014-05-05: 0.4 mg via INTRAVENOUS

## 2014-05-05 MED ORDER — ALBUMIN HUMAN 5 % IV SOLN
INTRAVENOUS | Status: AC
  Filled 2014-05-05: qty 250

## 2014-05-05 MED ORDER — SODIUM CHLORIDE 0.9 % IR SOLN
Status: AC
  Filled 2014-05-05: qty 1

## 2014-05-05 MED ORDER — GLYCOPYRROLATE 0.2 MG/ML IJ SOLN
INTRAMUSCULAR | Status: AC
  Filled 2014-05-05: qty 3

## 2014-05-05 MED ORDER — LACTATED RINGERS IV SOLN
INTRAVENOUS | Status: DC | PRN
  Administered 2014-05-05: 15:00:00 via INTRAVENOUS

## 2014-05-05 MED ORDER — DEXTROSE 50 % IV SOLN
25.0000 mL | Freq: Once | INTRAVENOUS | Status: AC
  Administered 2014-05-05: 25 mL via INTRAVENOUS
  Filled 2014-05-05: qty 50

## 2014-05-05 MED ORDER — PROPOFOL 10 MG/ML IV BOLUS
INTRAVENOUS | Status: AC
  Filled 2014-05-05: qty 20

## 2014-05-05 MED ORDER — BACITRACIN-NEOMYCIN-POLYMYXIN OINTMENT TUBE
TOPICAL_OINTMENT | CUTANEOUS | Status: DC | PRN
  Administered 2014-05-05: 1 via TOPICAL

## 2014-05-05 MED ORDER — CEFAZOLIN SODIUM-DEXTROSE 2-3 GM-% IV SOLR
INTRAVENOUS | Status: AC
  Filled 2014-05-05: qty 50

## 2014-05-05 MED ORDER — DEXTROSE 50 % IV SOLN
INTRAVENOUS | Status: DC | PRN
  Administered 2014-05-05: 1 via INTRAVENOUS

## 2014-05-05 MED ORDER — FENTANYL CITRATE (PF) 250 MCG/5ML IJ SOLN
INTRAMUSCULAR | Status: DC | PRN
  Administered 2014-05-05 (×3): 50 ug via INTRAVENOUS
  Administered 2014-05-05: 100 ug via INTRAVENOUS

## 2014-05-05 MED ORDER — THROMBIN 5000 UNITS EX SOLR
CUTANEOUS | Status: DC | PRN
  Administered 2014-05-05: 5000 [IU] via TOPICAL

## 2014-05-05 MED ORDER — DEXTROSE 5 % IV SOLN
500.0000 mg | Freq: Four times a day (QID) | INTRAVENOUS | Status: DC | PRN
  Administered 2014-05-05: 500 mg via INTRAVENOUS
  Filled 2014-05-05 (×3): qty 5

## 2014-05-05 MED ORDER — ONDANSETRON HCL 4 MG/2ML IJ SOLN
INTRAMUSCULAR | Status: AC
  Filled 2014-05-05: qty 2

## 2014-05-05 SURGICAL SUPPLY — 60 items
BAG ZIPLOCK 12X15 (MISCELLANEOUS) ×5 IMPLANT
BANDAGE ESMARK 6X9 LF (GAUZE/BANDAGES/DRESSINGS) ×3 IMPLANT
BLADE SAW SAG 73X25 THK (BLADE) ×2
BLADE SAW SGTL 73X25 THK (BLADE) ×3 IMPLANT
BNDG ESMARK 6X9 LF (GAUZE/BANDAGES/DRESSINGS) ×5
CABLE (Orthopedic Implant) ×15 IMPLANT
CUFF TOURN SGL QUICK 18 (TOURNIQUET CUFF) IMPLANT
CUFF TOURN SGL QUICK 34 (TOURNIQUET CUFF)
CUFF TRNQT CYL 34X4X40X1 (TOURNIQUET CUFF) IMPLANT
DRAPE INCISE IOBAN 66X45 STRL (DRAPES) ×5 IMPLANT
DRAPE OEC MINIVIEW 54X84 (DRAPES) IMPLANT
DRAPE ORTHO SPLIT 77X108 STRL (DRAPES) ×6
DRAPE SURG ORHT 6 SPLT 77X108 (DRAPES) ×6 IMPLANT
DRAPE U-SHAPE 47X51 STRL (DRAPES) ×5 IMPLANT
DRSG ADAPTIC 3X8 NADH LF (GAUZE/BANDAGES/DRESSINGS) ×5 IMPLANT
DRSG AQUACEL AG ADV 3.5X 4 (GAUZE/BANDAGES/DRESSINGS) IMPLANT
DRSG PAD ABDOMINAL 8X10 ST (GAUZE/BANDAGES/DRESSINGS) ×5 IMPLANT
DURAPREP 26ML APPLICATOR (WOUND CARE) ×5 IMPLANT
ELECT REM PT RETURN 9FT ADLT (ELECTROSURGICAL) ×5
ELECTRODE REM PT RTRN 9FT ADLT (ELECTROSURGICAL) ×3 IMPLANT
EVACUATOR 1/8 PVC DRAIN (DRAIN) IMPLANT
GAUZE SPONGE 4X4 12PLY STRL (GAUZE/BANDAGES/DRESSINGS) ×5 IMPLANT
GLOVE BIOGEL PI IND STRL 6.5 (GLOVE) ×6 IMPLANT
GLOVE BIOGEL PI IND STRL 8 (GLOVE) ×3 IMPLANT
GLOVE BIOGEL PI INDICATOR 6.5 (GLOVE) ×4
GLOVE BIOGEL PI INDICATOR 8 (GLOVE) ×2
GLOVE ECLIPSE 8.0 STRL XLNG CF (GLOVE) ×5 IMPLANT
GOWN STRL REUS W/TWL LRG LVL3 (GOWN DISPOSABLE) ×5 IMPLANT
GOWN STRL REUS W/TWL XL LVL3 (GOWN DISPOSABLE) ×5 IMPLANT
HANDPIECE INTERPULSE COAX TIP (DISPOSABLE)
IMMOBILIZER KNEE 20 (SOFTGOODS) ×5
IMMOBILIZER KNEE 20 THIGH 36 (SOFTGOODS) ×3 IMPLANT
KIT BASIN OR (CUSTOM PROCEDURE TRAY) ×5 IMPLANT
MANIFOLD NEPTUNE II (INSTRUMENTS) ×5 IMPLANT
NEEDLE HYPO 21X1.5 SAFETY (NEEDLE) ×5 IMPLANT
NS IRRIG 1000ML POUR BTL (IV SOLUTION) ×5 IMPLANT
PACK ORTHO EXTREMITY (CUSTOM PROCEDURE TRAY) IMPLANT
PACK TOTAL JOINT (CUSTOM PROCEDURE TRAY) ×5 IMPLANT
PASSER SUT SWANSON 36MM LOOP (INSTRUMENTS) IMPLANT
POSITIONER SURGICAL ARM (MISCELLANEOUS) ×5 IMPLANT
SCOTCHCAST PLUS 2X4 WHITE (CAST SUPPLIES) ×10 IMPLANT
SET HNDPC FAN SPRY TIP SCT (DISPOSABLE) IMPLANT
SPONGE LAP 18X18 X RAY DECT (DISPOSABLE) ×5 IMPLANT
SPONGE SURGIFOAM ABS GEL 100 (HEMOSTASIS) ×5 IMPLANT
STAPLER VISISTAT 35W (STAPLE) ×5 IMPLANT
STOCKINETTE 8 INCH (MISCELLANEOUS) ×5 IMPLANT
SUT VIC AB 0 CT1 27 (SUTURE)
SUT VIC AB 0 CT1 27XBRD ANTBC (SUTURE) IMPLANT
SUT VIC AB 1 CT1 27 (SUTURE) ×24
SUT VIC AB 1 CT1 27XBRD ANTBC (SUTURE) ×24 IMPLANT
SUT VIC AB 2-0 CT1 27 (SUTURE)
SUT VIC AB 2-0 CT1 TAPERPNT 27 (SUTURE) IMPLANT
SWAB COLLECTION DEVICE MRSA (MISCELLANEOUS) ×5 IMPLANT
SYR 20CC LL (SYRINGE) ×5 IMPLANT
TOWEL OR 17X26 10 PK STRL BLUE (TOWEL DISPOSABLE) ×10 IMPLANT
TOWER CARTRIDGE SMART MIX (DISPOSABLE) IMPLANT
TRAY FOLEY W/METER SILVER 14FR (SET/KITS/TRAYS/PACK) ×5 IMPLANT
TUBE ANAEROBIC SPECIMEN COL (MISCELLANEOUS) ×5 IMPLANT
UNDERPAD 30X30 INCONTINENT (UNDERPADS AND DIAPERS) ×5 IMPLANT
WATER STERILE IRR 1500ML POUR (IV SOLUTION) ×5 IMPLANT

## 2014-05-05 NOTE — H&P (View-Only) (Signed)
   Subjective: Planned Procedure(s): ARTHROPLASTY BIPOLAR HIP (HEMIARTHROPLASTY) (Left) CLOSED ReDUCTION AND CAST APPLICATION (FIBERGLASS) (Left) wrist HARDWARE REMOVAL LEFT HIP (Left) Patient reports pain as moderate.   Patient seen in rounds for Dr. Gladstone Lighter. Patient is hacing pain in the left hip and left wrist. Denies SOB and chest pain. Reports understanding of surgical plan.    Objective: Vital signs in last 24 hours: Temp:  [98.2 F (36.8 C)-98.7 F (37.1 C)] 98.7 F (37.1 C) (05/04 0600) Pulse Rate:  [68-74] 68 (05/04 0600) Resp:  [18-20] 20 (05/04 0600) BP: (108-126)/(48-62) 126/62 mmHg (05/04 0600) SpO2:  [94 %-95 %] 95 % (05/04 0600) Weight:  [105.688 kg (233 lb)] 105.688 kg (233 lb) (05/03 2012)  Intake/Output from previous day:  Intake/Output Summary (Last 24 hours) at 05/05/14 0811 Last data filed at 05/05/14 0326  Gross per 24 hour  Intake 1713.75 ml  Output   2450 ml  Net -736.25 ml     Labs:  Recent Labs  05/02/14 2318 05/03/14 0535 05/04/14 0542 05/05/14 0533  HGB 9.1* 9.2* 8.9* 9.0*    Recent Labs  05/04/14 0542 05/05/14 0533  WBC 8.7 9.4  RBC 3.20* 3.24*  HCT 29.3* 29.5*  PLT 253 275    Recent Labs  05/04/14 0542 05/05/14 0533  NA 139 142  K 3.4* 3.6  CL 111 112*  CO2 22 22  BUN 31* 28*  CREATININE 2.70* 2.29*  GLUCOSE 121* 107*  CALCIUM 8.6* 8.8*    EXAM General - Patient is Alert and Oriented Extremity - Neurologically intact Intact pulses distally Compartment soft   Past Medical History  Diagnosis Date  . Anxiety   . Depression   . Hypertension   . HIV infection 2000  . Asthma     HOSPITALIZED WITH EXCERBATION OF ASTHMA - AND BRONCHITIS AND THE FLU DEC 2013  . Shortness of breath     ALLERGIES ARE "ACTING UP"  . Diabetes mellitus     ON INSULIN AND ORAL MEDICATIONS  . GERD (gastroesophageal reflux disease)   . Neuropathy     NEUROPATHY HANDS AND FEET  . Arthritis     LEFT HIP  . Pain     SEVERE PAIN  LEFT HIP - HX OF LEFT HIP PINNING JULY 2013    Assessment/Plan:   Procedure(s) (LRB): ARTHROPLASTY BIPOLAR HIP (HEMIARTHROPLASTY) (Left) CAST APPLICATION (FIBERGLASS) (Left) HARDWARE REMOVAL LEFT HIP (Left) Principal Problem:   ARF (acute renal failure) Active Problems:   Human immunodeficiency virus (HIV) disease   Diabetes mellitus type 2 with complications, uncontrolled   Left hip pain   Asthma   Hip pain  Estimated body mass index is 34.53 kg/(m^2) as calculated from the following:   Height as of this encounter: 5\' 9"  (1.753 m).   Weight as of this encounter: 106.1 kg (233 lb 14.5 oz).  DVT Prophylaxis - held due to planned surgery this afternoon Weight-Bearing as tolerated to left UE and left LE  Will keep NPO today. Plan for OR this afternoon for removal of two screws and unipolar hemiarthroplasty of left hip as well as closed reduction of left wrist with application of fiberglass cast.   Ardeen Jourdain, PA-C Orthopaedic Surgery 05/05/2014, 8:11 AM

## 2014-05-05 NOTE — Plan of Care (Signed)
Problem: Phase I Progression Outcomes Goal: OOB as tolerated unless otherwise ordered Outcome: Not Progressing Pt scheduled for surg to left hip and left wrist 05/05/14

## 2014-05-05 NOTE — Progress Notes (Signed)
[facility] PROGRESS NOTE  [name] [id number] DOB: 1965-07-10 DOA: 05/02/2014 PCP: Aura Dials, PA-C  Assessment/Plan: 49 y.o. female with PMH of HIV, IDDM, HTN, h/o Hip Avascular necrosis, Hip surgery and Asthma who presents the ED with complaints of severe left hip pain after falling 3 days ago. She reports that she has been taking increased amounts of Tylenol and Ibuprofen for her pain. She was evaluated in the ED and was found to have and elevated BUN/Cr of 30/4.07 from a [place] of 1.5. -admitted with AKI, hip pain  1.  Acute renal failure likely secondary to NSAID use, Bactrim, Lasix, metformin on top of dehydration/prerenal; CT : no hydronephrosis  -renal function is improving on IVF. continue gentle hydration, strict I's and O's. hold nephrotoxins   2. H/o L hip arthoplasty, avascular hip necrosis. Now CT: Acute, comminuted left intertrochanteric fracture. Per ortho scheduled for L hip hardware removal. L wrist surgery today. Patient denies any cardiopulmonary symptoms. Echo: LVEF: 60%  3. DM , ha1c-8.3. continue Lantus and sliding scale insulin 4. HIV. On antiretroviral    Code Status: full Family Communication: d/w patient (indicate person spoken with, relationship, and if by phone, the number) Disposition Plan: pend surgery   Consultants:  ortho  Procedures:  Pend surgeyr   Antibiotics:  none (indicate start date, and stop date if known)  HPI/Subjective: Alert, oriented   Objective: Filed Vitals:   05/05/14 0600  BP: 126/62  Pulse: 68  Temp: 98.7 F (37.1 C)  Resp: 20    Intake/Output Summary (Last 24 hours) at 05/05/14 1202 Last data filed at 05/05/14 0900  Gross per 24 hour  Intake 1713.75 ml  Output   3450 ml  Net -1736.25 ml   Filed Weights   05/03/14 0316  Weight: 106.1 kg (233 lb 14.5 oz)    Exam:   General:  No distress   Cardiovascular: s1,s2 rrr  Respiratory: CTA BL  Abdomen: soft, nt,nd    Musculoskeletal: no leg pitting edema   Data Reviewed: Basic Metabolic Panel:  Recent Labs Lab 05/02/14 2318 05/03/14 0535 05/04/14 0542 05/05/14 0533  NA 134* 139 139 142  K 3.5 3.3* 3.4* 3.6  CL 107 107 111 112*  CO2 20* 22 22 22   GLUCOSE 250* 162* 121* 107*  BUN 39* 38* 31* 28*  CREATININE 4.07* 3.72* 2.70* 2.29*  CALCIUM 8.1* 8.5* 8.6* 8.8*   Liver Function Tests:  Recent Labs Lab 05/04/14 0542 05/05/14 0533  AST 18 15  ALT 14 14  ALKPHOS 169* 157*  BILITOT 0.5 0.3  PROT 6.4* 6.2*  ALBUMIN 3.0* 2.8*   No results for input(s): LIPASE, AMYLASE in the last 168 hours. No results for input(s): AMMONIA in the last 168 hours. CBC:  Recent Labs Lab 05/02/14 2318 05/03/14 0535 05/04/14 0542 05/05/14 0533  WBC 11.3* 10.4 8.7 9.4  NEUTROABS 8.9*  --   --   --   HGB 9.1* 9.2* 8.9* 9.0*  HCT 29.7* 30.8* 29.3* 29.5*  MCV 90.0 91.1 91.6 91.0  PLT 249 251 253 275   Cardiac Enzymes: No results for input(s): CKTOTAL, CKMB, CKMBINDEX, TROPONINI in the last 168 hours. BNP (last 3 results) No results for input(s): BNP in the last 8760 hours.  ProBNP (last 3 results) No results for input(s): PROBNP in the last 8760 hours.  CBG:  Recent Labs Lab 05/04/14 1634 05/04/14 2203 05/05/14 0416 05/05/14 0733 05/05/14 1158  GLUCAP 167* 138* 123* 81 64*    Recent Results (from  the past 240 hour(s))  Surgical pcr screen     Status: None   Collection Time: 05/04/14  9:16 PM  Result Value Ref Range Status   MRSA, PCR NEGATIVE NEGATIVE Final   Staphylococcus aureus NEGATIVE NEGATIVE Final    Comment:        The Xpert SA Assay (FDA approved for NASAL specimens in patients over 73 years of age), is one component of a comprehensive surveillance program.  Test performance has been validated by [facility] for patients greater than or equal to 62 year old. It is not intended to diagnose infection nor to guide or monitor treatment.      Studies: Ct Abdomen  Pelvis Wo Contrast  05/03/2014   CLINICAL DATA:  49 year old female with abdominal pain and distention. Evaluate for potential bowel obstruction.  EXAM: CT ABDOMEN AND PELVIS WITHOUT CONTRAST  TECHNIQUE: Multidetector CT imaging of the abdomen and pelvis was performed following the standard protocol without IV contrast.  COMPARISON:  No priors.  FINDINGS: Lower chest:  Unremarkable.  Hepatobiliary: Mild diffuse low attenuation throughout the hepatic parenchyma, compatible with mild hepatic steatosis. No definite cystic or solid hepatic lesions are identified on today's noncontrast CT examination. The unenhanced appearance of the gallbladder is normal.  Pancreas: Unremarkable.  Spleen: Unremarkable.  Adrenals/Urinary Tract: Bilateral adrenal glands are normal in appearance. Bilateral kidneys are normal in appearance. Incidental note is made of duplication of the right renal collecting system and the proximal half of the right ureter (normal anatomical variant). The unenhanced appearance of the urinary bladder is normal.  Stomach/Bowel: The unenhanced appearance of the stomach is normal. No pathologic dilatation of small bowel or colon to suggest bowel obstruction at this time. Normal appendix.  Vascular/Lymphatic: Atherosclerosis throughout the abdominal and pelvic vasculature, without definite aneurysm. No lymphadenopathy noted in the abdomen or pelvis on today's noncontrast CT examination.  Reproductive: Unremarkable.  Other: No significant volume of ascites.  No pneumoperitoneum.  Musculoskeletal: Postoperative changes of ORIF for again noted in the left femoral neck where there are 2 cannulated fixation screws traversing an intertrochanteric hip fracture. The fracture line is still well visualized. There are no aggressive appearing lytic or blastic lesions noted in the visualized portions of the skeleton.  IMPRESSION: 1. No acute findings in the abdomen or pelvis to account for the patient's symptoms.  Specifically, no signs of bowel obstruction are noted at this time. 2. Normal appendix. 3. Atherosclerosis. 4. Status post ORIF in the left hip where there is a healing intertrochanteric hip fracture. 5. Mild hepatic steatosis. 6. Additional incidental findings, as above.   Electronically Signed   By: Vinnie Langton M.D.   On: 05/03/2014 18:35   Dg Wrist 2 Views Left  05/03/2014   CLINICAL DATA:  [place] last Wednesday injuring wrist trying to brace fall, pain swelling and bruising throughout LEFT wrist, initial encounter  EXAM: LEFT WRIST - 2 VIEW  COMPARISON:  Portable exam 1430 hours without priors for comparison.  FINDINGS: Diffuse osseous demineralization.  Transverse fracture distal LEFT radial metaphysis with dorsal tilt of distal radial articular surface and minimal displacement.  No definite intra-articular extension  Nondisplaced ulnar styloid fracture at tip.  Regional soft tissue swelling.  No additional fracture, dislocation or bone destruction.  IMPRESSION: Nondisplaced ulnar styloid fracture.  Angulated and minimally displaced distal LEFT radial metaphyseal fracture.   Electronically Signed   By: Lavonia Dana M.D.   On: 05/03/2014 14:55   Ct Hip Left Wo Contrast  05/04/2014  CLINICAL DATA:  Left hip fracture.  EXAM: CT OF THE LEFT HIP WITHOUT CONTRAST  TECHNIQUE: Multidetector CT imaging of the left hip was performed according to the standard protocol. Multiplanar CT image reconstructions were also generated.  COMPARISON:  None.  FINDINGS: There is a comminuted left intertrochanteric fracture. There are 2 cannulated screws transfixing a healed femoral neck fracture. There is patchy sclerosis and lucency in the left femoral head which may reflect changes from bone remodeling secondary to fracture healing, but an element of avascular necrosis cannot be excluded.  There is no other fracture or dislocation. There is no lytic or sclerotic osseous lesion. There is soft tissue contusion in the overlying  subcutaneous fat. The muscles are normal. There is no hematoma or fluid collection.  IMPRESSION: 1. Acute, comminuted left intertrochanteric fracture. 2. Two cannulated screws transfixing a healed femoral neck fracture.   Electronically Signed   By: Kathreen Devoid   On: 05/04/2014 17:55    Scheduled Meds: . ARIPiprazole  2 mg Oral Daily  . atenolol  25 mg Oral q morning - 10a  . cholecalciferol  5,000 Units Oral Daily  . efavirenz  600 mg Oral QHS  . emtricitabine  200 mg Oral Q48H  . escitalopram  5 mg Oral Daily  . fenofibrate  160 mg Oral Daily  . insulin aspart  0-15 Units Subcutaneous TID WC  . insulin aspart  0-5 Units Subcutaneous QHS  . insulin glargine  35 Units Subcutaneous BID  . pantoprazole  40 mg Oral q morning - 10a  . pregabalin  225 mg Oral BID  . rosuvastatin  20 mg Oral Daily  . tenofovir  300 mg Oral Q48H   Continuous Infusions: . 0.9 % NaCl with KCl 20 mEq / L 75 mL/hr at 05/04/14 2309    Principal Problem:   ARF (acute renal failure) Active Problems:   Human immunodeficiency virus (HIV) disease   Diabetes mellitus type 2 with complications, uncontrolled   Left hip pain   Asthma   Hip pain    Time spent: >35 minutes     Kinnie Feil  [facility] Pager [phone]. If 7PM-7AM, please contact night-coverage at [email or web address], password [facility] 05/05/2014, 12:02 PM  LOS: 2 days

## 2014-05-05 NOTE — Interval H&P Note (Signed)
History and Physical Interval Note:  05/05/2014 3:52 PM  Sunday [name]  has presented today for surgery, with the diagnosis of severe pain left hip/left arm colles fracture  The various methods of treatment have been discussed with the patient and family. After consideration of risks, benefits and other options for treatment, the patient has consented to  Procedure(s) with comments: ARTHROPLASTY BIPOLAR HIP (HEMIARTHROPLASTY) (Left) CAST APPLICATION (FIBERGLASS) (Left) - CLOSED REDUCTION OF LEFT COLLES FRACTURE WITH SHORT ARM CAST HARDWARE REMOVAL LEFT HIP (Left) - REMOVAL BIOMET 6.5-8.0 CANNULATED SCREW as a surgical intervention .  The patient's history has been reviewed, patient examined, no change in status, stable for surgery.  I have reviewed the patient's chart and labs.  Questions were answered to the patient's satisfaction.     [name] A

## 2014-05-05 NOTE — Anesthesia Preprocedure Evaluation (Addendum)
Anesthesia Evaluation  Patient identified by MRN, date of birth, ID band Patient awake    Reviewed: Allergy & Precautions, NPO status , reviewed documented beta blocker date and time   Airway Mallampati: III   Neck ROM: Full  Mouth opening: Limited Mouth Opening  Dental  (+) Teeth Intact, Dental Advisory Given   Pulmonary shortness of breath, Current Smoker,  breath sounds clear to auscultation        Cardiovascular hypertension, Pt. on medications Rhythm:Regular  ECHO EF 65% 05/2014   Neuro/Psych Anxiety Depression    GI/Hepatic GERD-  Medicated,  Endo/Other  diabetes, Type 2, Insulin Dependent  Renal/GU Renal InsufficiencyRenal diseaseCreat 2.2     Musculoskeletal  (+) Arthritis -,   Abdominal (+) + obese,   Peds  Hematology  (+) anemia , HIV, 9/27   Anesthesia Other Findings   Reproductive/Obstetrics                          Anesthesia Physical Anesthesia Plan  ASA: III  Anesthesia Plan: General   Post-op Pain Management:    Induction: Intravenous  Airway Management Planned: Oral ETT  Additional Equipment:   Intra-op Plan:   Post-operative Plan: Extubation in OR  Informed Consent: I have reviewed the patients History and Physical, chart, labs and discussed the procedure including the risks, benefits and alternatives for the proposed anesthesia with the patient or authorized representative who has indicated his/her understanding and acceptance.     Plan Discussed with:   Anesthesia Plan Comments:         Anesthesia Quick Evaluation

## 2014-05-05 NOTE — Progress Notes (Addendum)
   Subjective: Planned Procedure(s): ARTHROPLASTY BIPOLAR HIP (HEMIARTHROPLASTY) (Left) CLOSED ReDUCTION AND CAST APPLICATION (FIBERGLASS) (Left) wrist HARDWARE REMOVAL LEFT HIP (Left) Patient reports pain as moderate.   Patient seen in rounds for Dr. Gladstone Lighter. Patient is hacing pain in the left hip and left wrist. Denies SOB and chest pain. Reports understanding of surgical plan.    Objective: Vital signs in last 24 hours: Temp:  [98.2 F (36.8 C)-98.7 F (37.1 C)] 98.7 F (37.1 C) (05/04 0600) Pulse Rate:  [68-74] 68 (05/04 0600) Resp:  [18-20] 20 (05/04 0600) BP: (108-126)/(48-62) 126/62 mmHg (05/04 0600) SpO2:  [94 %-95 %] 95 % (05/04 0600) Weight:  [105.688 kg (233 lb)] 105.688 kg (233 lb) (05/03 2012)  Intake/Output from previous day:  Intake/Output Summary (Last 24 hours) at 05/05/14 0811 Last data filed at 05/05/14 0326  Gross per 24 hour  Intake 1713.75 ml  Output   2450 ml  Net -736.25 ml     Labs:  Recent Labs  05/02/14 2318 05/03/14 0535 05/04/14 0542 05/05/14 0533  HGB 9.1* 9.2* 8.9* 9.0*    Recent Labs  05/04/14 0542 05/05/14 0533  WBC 8.7 9.4  RBC 3.20* 3.24*  HCT 29.3* 29.5*  PLT 253 275    Recent Labs  05/04/14 0542 05/05/14 0533  NA 139 142  K 3.4* 3.6  CL 111 112*  CO2 22 22  BUN 31* 28*  CREATININE 2.70* 2.29*  GLUCOSE 121* 107*  CALCIUM 8.6* 8.8*    EXAM General - Patient is Alert and Oriented Extremity - Neurologically intact Intact pulses distally Compartment soft   Past Medical History  Diagnosis Date  . Anxiety   . Depression   . Hypertension   . HIV infection 2000  . Asthma     HOSPITALIZED WITH EXCERBATION OF ASTHMA - AND BRONCHITIS AND THE FLU DEC 2013  . Shortness of breath     ALLERGIES ARE "ACTING UP"  . Diabetes mellitus     ON INSULIN AND ORAL MEDICATIONS  . GERD (gastroesophageal reflux disease)   . Neuropathy     NEUROPATHY HANDS AND FEET  . Arthritis     LEFT HIP  . Pain     SEVERE PAIN  LEFT HIP - HX OF LEFT HIP PINNING JULY 2013    Assessment/Plan:   Procedure(s) (LRB): ARTHROPLASTY BIPOLAR HIP (HEMIARTHROPLASTY) (Left) CAST APPLICATION (FIBERGLASS) (Left) HARDWARE REMOVAL LEFT HIP (Left) Principal Problem:   ARF (acute renal failure) Active Problems:   Human immunodeficiency virus (HIV) disease   Diabetes mellitus type 2 with complications, uncontrolled   Left hip pain   Asthma   Hip pain  Estimated body mass index is 34.53 kg/(m^2) as calculated from the following:   Height as of this encounter: 5\' 9"  (1.753 m).   Weight as of this encounter: 106.1 kg (233 lb 14.5 oz).  DVT Prophylaxis - held due to planned surgery this afternoon Weight-Bearing as tolerated to left UE and left LE  Will keep NPO today. Plan for OR this afternoon for removal of two screws and unipolar hemiarthroplasty of left hip as well as closed reduction of left wrist with application of fiberglass cast.   Ardeen Jourdain, PA-C Orthopaedic Surgery 05/05/2014, 8:11 AM

## 2014-05-05 NOTE — Transfer of Care (Signed)
Immediate Anesthesia Transfer of Care Note  Patient: [name]  Procedure(s) Performed: Procedure(s) with comments: ARTHROPLASTY OPEN REDUCTION INTERNAL FIXATION LEFT HIP AND REMOVAL OF TWO CANNULATED SCREW (Left) CAST APPLICATION (FIBERGLASS) (Left) - CLOSED REDUCTION OF LEFT COLLES FRACTURE WITH SHORT ARM CAST HARDWARE REMOVAL LEFT HIP (Left) - REMOVAL BIOMET 6.5-8.0 CANNULATED SCREW  Patient Location: PACU  Anesthesia Type:General  Level of Consciousness: awake and patient cooperative  Airway & Oxygen Therapy: Patient Spontanous Breathing and Patient connected to face mask oxygen  Post-op Assessment: Report given to RN and Post -op Vital signs reviewed and stable  Post vital signs: Reviewed and stable  Last Vitals:  Filed Vitals:   05/05/14 1345  BP: 110/66  Pulse: 77  Temp: 36.9 C  Resp: 20    Complications: No apparent anesthesia complications

## 2014-05-05 NOTE — Brief Op Note (Signed)
05/02/2014 - 05/05/2014  6:24 PM  PATIENT:  [name]  49 y.o. female  PRE-OPERATIVE DIAGNOSIS:Closed Colles Fracture Left Wrist;Closed,Displaced Intertrochanteric Hip Fracture on the Left.Superficial Infection of Left Foot.  POST-OPERATIVE DIAGNOSIS:  Same as Pre-Op  PROCEDURE:  Open Reduction and Internal Fixation of Displaced Intertrochanteric Left Hip Fracture.Closed Reduction of Displaced Colles Fracture on the Left and application of a Short arm Cast.Superficial Debridement of Infection left foot. SURGEON:  Surgeon(s) and Role:    * Latanya Maudlin, MD - Primary  PHYSICIAN ASSISTANT: Ardeen Jourdain PA  ASSISTANTS: Ardeen Jourdain PA  ANESTHESIA:   none  EBL:  Total I/O In: [id number] [I.V.:1300; IV Piggyback:250] Out: 2100 [Urine:1700; Blood:400]  BLOOD ADMINISTERED:none  DRAINS: None  LOCAL MEDICATIONS USED:  BUPIVICAINE 20cc of Exparel mixed with 20cc of Normal Saline   SPECIMEN:  No Specimen and Source of Specimen:  Fluid Left Hip for Anaerobic and Aerobic, Patient was on Ancef.  DISPOSITION OF SPECIMEN:  PATHOLOGY  COUNTS:  YES  TOURNIQUET:    DICTATION: .Other Dictation: Dictation Number [id number]  PLAN OF CARE: Admit to inpatient   PATIENT DISPOSITION:  PACU - hemodynamically stable.   Delay start of Pharmacological VTE agent (>24hrs) due to surgical blood loss or risk of bleeding: yes

## 2014-05-05 NOTE — Progress Notes (Signed)
PT Cancellation Note  Patient Details Name: [name] MRN: [id number] DOB: 1965-09-18   Cancelled Treatment:    Reason Eval/Treat Not Completed: Medical issues which prohibited therapy (CT showed L hip fx, pt to have surgery today on L hip and wrist. Will re-eval tomorrow. )   Blondell Reveal Kistler 05/05/2014, 8:25 AM [phone]

## 2014-05-05 NOTE — Progress Notes (Signed)
Patient cbg 65. Patient alert and verbal but NPO for surgery today. Hypoglycemic protocol initiated and patient given 51ml dextrose via IV. CBG recheck 64. MD notified and order given to give another dose of dextrose 87ml. Patient transferred to OR before this nurse was able to give 2nd dose. Or nurse notified of need to give dose and change IV fluids.  Pharmacy also notified to send IV fluids and dextrose dose to OR where patient was being transferred.

## 2014-05-05 NOTE — Anesthesia Procedure Notes (Signed)
Date/Time: 05/05/2014 4:01 PM Performed by: Dione Booze Pre-anesthesia Checklist: Patient identified, Emergency Drugs available, Suction available and Patient being monitored Patient Re-evaluated:Patient Re-evaluated prior to inductionOxygen Delivery Method: Circle system utilized Preoxygenation: Pre-oxygenation with 100% oxygen Intubation Type: IV induction Ventilation: Mask ventilation without difficulty Tube type: Oral Tube size: 7.5 mm Number of attempts: 1 Airway Equipment and Method: Stylet Placement Confirmation: ETT inserted through vocal cords under direct vision,  breath sounds checked- equal and bilateral and positive ETCO2 Secured at: 21 cm Tube secured with: Tape

## 2014-05-05 NOTE — Progress Notes (Signed)
OT Cancellation Note  Patient Details Name: [name] MRN: [id number] DOB: [birth year]   Cancelled Treatment:    Reason Eval/Treat Not Completed: Medical issues which prohibited therapy  Medical issues which prohibited therapy (CT showed L hip fx, pt to have surgery today on L hip and wrist. Will re-eval tomorrow)  Payton Mccallum D 05/05/2014, 8:59 AM

## 2014-05-05 NOTE — Anesthesia Postprocedure Evaluation (Signed)
  Anesthesia Post-op Note  Patient: [name]  Procedure(s) Performed: Procedure(s) with comments: ARTHROPLASTY OPEN REDUCTION INTERNAL FIXATION LEFT HIP AND REMOVAL OF TWO CANNULATED SCREW (Left) CAST APPLICATION (FIBERGLASS) (Left) - CLOSED REDUCTION OF LEFT COLLES FRACTURE WITH SHORT ARM CAST HARDWARE REMOVAL LEFT HIP (Left) - REMOVAL BIOMET 6.5-8.0 CANNULATED SCREW  Patient Location: PACU  Anesthesia Type:General  Level of Consciousness: awake  Airway and Oxygen Therapy: Patient Spontanous Breathing and Patient connected to nasal cannula oxygen  Post-op Pain: mild  Post-op Assessment: Post-op Vital signs reviewed and Patient's Cardiovascular Status Stable  Post-op Vital Signs: Reviewed and stable  Last Vitals:  Filed Vitals:   05/05/14 1845  BP:   Pulse: 80  Temp:   Resp: 14    Complications: No apparent anesthesia complications

## 2014-05-06 ENCOUNTER — Encounter (HOSPITAL_COMMUNITY): Payer: Self-pay | Admitting: Orthopedic Surgery

## 2014-05-06 LAB — COMPREHENSIVE METABOLIC PANEL
ALT: 11 U/L — ABNORMAL LOW (ref 14–54)
AST: 17 U/L (ref 15–41)
Albumin: 2.9 g/dL — ABNORMAL LOW (ref 3.5–5.0)
Alkaline Phosphatase: 144 U/L — ABNORMAL HIGH (ref 38–126)
Anion gap: 7 (ref 5–15)
BUN: 19 mg/dL (ref 6–20)
CO2: 23 mmol/L (ref 22–32)
Calcium: 8.4 mg/dL — ABNORMAL LOW (ref 8.9–10.3)
Chloride: 108 mmol/L (ref 101–111)
Creatinine, Ser: 1.82 mg/dL — ABNORMAL HIGH (ref 0.44–1.00)
GFR calc Af Amer: 36 mL/min — ABNORMAL LOW (ref >60)
GFR calc non Af Amer: 31 mL/min — ABNORMAL LOW (ref >60)
Glucose, Bld: 88 mg/dL (ref 70–99)
Potassium: 3.7 mmol/L (ref 3.5–5.1)
Sodium: 138 mmol/L (ref 135–145)
Total Bilirubin: 0.7 mg/dL (ref 0.3–1.2)
Total Protein: 6.2 g/dL — ABNORMAL LOW (ref 6.5–8.1)

## 2014-05-06 LAB — CBC WITH DIFFERENTIAL/PLATELET
Basophils Absolute: 0 K/uL (ref 0.0–0.1)
Basophils Relative: 0 % (ref 0–1)
Eosinophils Absolute: 0.3 K/uL (ref 0.0–0.7)
Eosinophils Relative: 3 % (ref 0–5)
HCT: 33 % — ABNORMAL LOW (ref 36.0–46.0)
Hemoglobin: 10.2 g/dL — ABNORMAL LOW (ref 12.0–15.0)
Lymphocytes Relative: 10 % — ABNORMAL LOW (ref 12–46)
Lymphs Abs: 1 K/uL (ref 0.7–4.0)
MCH: 27.9 pg (ref 26.0–34.0)
MCHC: 30.9 g/dL (ref 30.0–36.0)
MCV: 90.4 fL (ref 78.0–100.0)
Monocytes Absolute: 0.7 K/uL (ref 0.1–1.0)
Monocytes Relative: 8 % (ref 3–12)
Neutro Abs: 7.6 K/uL (ref 1.7–7.7)
Neutrophils Relative %: 79 % — ABNORMAL HIGH (ref 43–77)
Platelets: 228 K/uL (ref 150–400)
RBC: 3.65 MIL/uL — ABNORMAL LOW (ref 3.87–5.11)
RDW: 19 % — ABNORMAL HIGH (ref 11.5–15.5)
WBC: 9.5 K/uL (ref 4.0–10.5)

## 2014-05-06 LAB — GLUCOSE, CAPILLARY
GLUCOSE-CAPILLARY: 87 mg/dL (ref 70–99)
GLUCOSE-CAPILLARY: 215 mg/dL — AB (ref 70–99)
Glucose-Capillary: 156 mg/dL — ABNORMAL HIGH (ref 70–99)
Glucose-Capillary: 180 mg/dL — ABNORMAL HIGH (ref 70–99)

## 2014-05-06 NOTE — Progress Notes (Signed)
Subjective: 1 Day Post-Op Procedure(s) (LRB): ARTHROPLASTY OPEN REDUCTION INTERNAL FIXATION LEFT HIP AND REMOVAL OF TWO CANNULATED SCREW (Left) CAST APPLICATION (FIBERGLASS) (Left) HARDWARE REMOVAL LEFT HIP (Left) Patient reports pain as 2 on 0-10 scale.  Doing well today Xrays reviewed and hip looks fine in regards to her hip Fracture. No signs of Osteo in Left Foot. Wrist FX is Anatomical.  Objective: Vital signs in last 24 hours: Temp:  [98.1 F (36.7 C)-99.7 F (37.6 C)] 99.7 F (37.6 C) (05/05 0502) Pulse Rate:  [74-102] 102 (05/05 0502) Resp:  [9-20] 20 (05/05 0231) BP: (110-158)/(58-86) 126/78 mmHg (05/05 0502) SpO2:  [93 %-100 %] 99 % (05/05 0502)  Intake/Output from previous day: 05/04 0701 - 05/05 0700 In: 3591.4 [P.O.:240; I.V.:2373.9; Blood:672.5; IV [id number] Out: 3200 [Urine:2800; Blood:400] Intake/Output this shift:     Recent Labs  05/04/14 0542 05/05/14 0533 05/05/14 1845 05/06/14 0520  HGB 8.9* 9.0* 8.5* 10.2*    Recent Labs  05/05/14 0533 05/05/14 1845 05/06/14 0520  WBC 9.4  --  9.5  RBC 3.24*  --  3.65*  HCT 29.5* 27.9* 33.0*  PLT 275  --  228    Recent Labs  05/05/14 0533 05/06/14 0520  NA 142 138  K 3.6 3.7  CL 112* 108  CO2 22 23  BUN 28* 19  CREATININE 2.29* 1.82*  GLUCOSE 107* 88  CALCIUM 8.8* 8.4*   No results for input(s): LABPT, INR in the last 72 hours.  Compartment soft  Assessment/Plan: 1 Day Post-Op Procedure(s) (LRB): ARTHROPLASTY OPEN REDUCTION INTERNAL FIXATION LEFT HIP AND REMOVAL OF TWO CANNULATED SCREW (Left) CAST APPLICATION (FIBERGLASS) (Left) HARDWARE REMOVAL LEFT HIP (Left) Advance diet Discharge to [facility] Monday.  Tayah Idrovo A 05/06/2014, 7:40 AM

## 2014-05-06 NOTE — Op Note (Signed)
NAMEKRISHELLE, [name]             ACCOUNT NO.:  [id number]  MEDICAL RECORD NO.:  [id number]  LOCATION:  78                         FACILITY:  [facility]  PHYSICIAN:  Kipp Brood. Shelda Truby, M.D.DATE OF BIRTH:  October 12, 1965  DATE OF PROCEDURE:  05/05/2014 DATE OF DISCHARGE:                              OPERATIVE REPORT   SURGEON:  Kipp Brood. Gladstone Lighter, M.D.  OPERATIVE ASSISTANT:  Ardeen Jourdain, PA-C  PREOPERATIVE DIAGNOSES: 1. Superficial infection of the left foot. 2. Displaced Colles fracture, left wrist closed. 3. Displaced intertrochanteric fracture, left hip. 4. Early avascular necrosis, left hip. 5. Three cannulated pins from previous surgery, left hip.  POSTOPERATIVE DIAGNOSES: 1. Superficial infection of the left foot. 2. Displaced Colles fracture, left wrist closed. 3. Displaced intertrochanteric fracture, left hip. 4. Early avascular necrosis, left hip. 5. Three cannulated pins from previous surgery, left hip.  OPERATION: 1. Removal of two of the cannulating screws, left hip. 2. Open reduction internal fixation of a left intertrochanteric     fracture, left hip using 3 Stryker compression wires. 3. Closed reduction of a Colles fracture, left wrist. 4. Application of a short-arm cast, left wrist.  DESCRIPTION OF PROCEDURE:  Under general anesthesia, a routine orthopedic prep of the left hip was carried out followed by a sterile prep.  At this particular time, with the patient's right side down left side up and the appropriate time-out was first carried out.  I also marked the appropriate left hip in the holding area.  Left arm was marked as well previously.  Following this, the appropriate time-out as I mentioned, was carried out both with a Colles fracture and for the hip fracture.  The procedure under general anesthesia as I mentioned after this, preps were done.  The posterolateral approach to the hip was carried out.  Bleeders were identified and cauterized.  Note, we  had a significant amount of flow bleeding, venous bleeding.  I went down first to identify the screws through we removed 2 of the cannulated screws. Note, she did have a malunion of the femoral neck at this point instead of going through any more effort here to look for that third cannulating screw which we had difficulty finding.  I elected to leave that in.  We least keep that in for fixation of that femoral head.  This decision was made after we solve the displacement of this intertrochanteric fracture. When we internally rotated the hip, the fracture was much, much more visible then was shown on the CAT scan.  In fact, the plain x-rays were not really convincing of a fracture.  We initially set out to do an unipolar prosthesis.  Ones we were in there and solved the magnitude of the fracture, we electively to leave well enough alone, but just to fix the fracture site that we rotated the fracture back and utilized a 3 Synthes wires for compression fixation.  At this particular point, we had good fixation of the fracture site.  The bleeding was under good control.  We then thoroughly irrigated out the area, I inserted some thrombin-soaked Gelfoam.  Note initially were made the incision, there was a significant amount of fluid that came out of the  hip.  This was sent for cultures and sensitivities for aerobic and anaerobic.  The patient was on Ancef at that time.  After the hip was taken care of, we then removed the splint from the left wrist and I did a closed reduction of a Colles fracture on the left.  We had a good clinical reduction.  I then placed her in a well-padded short-arm cast.  At the end of the procedure, it was noted that after her sock was removed that she had a bandage over the lateral aspect of the foot.  I removed that cleaned it with some Betadine.  She had what looked like some small amount of purulent material there.  It was superficial.  We simply debrided that out,  dressed it with a Betadine dressing.  At this time, we then admitted the patient to the recovery room, where x-ray and AP of her left hip will be carried out.  Two views of the left wrist will be carried out and 2 views of the left foot to make sure we are not dealing with an underlying osteomyelitis of that left fifth metatarsal.  She will remain on her Ancef.  Note, her hemoglobin was low going into surgery.  We decided at this time to go ahead and get a stat hemoglobin in the recovery room.  We may need to transfuse her at that point.          ______________________________ Kipp Brood. Gladstone Lighter, M.D.     RAG/MEDQ  D:  05/05/2014  T:  05/06/2014  Job:  [id number]

## 2014-05-06 NOTE — Care Management Note (Addendum)
Case Management Note  Patient Details  Name: [name] MRN: [id number] Date of Birth: 1965-06-27  Subjective/Objective:          49 yo female admitted with ARF and ORIF hardware removal of L hip, Closed Reduction of Displaced Colles Fracture on the Left and application of a Short arm Cast   Action/Plan: Discharge planning  Expected Discharge Date:  05/08/14               Expected Discharge Plan:  [place]  In-House Referral:  Clinical Social Work  Discharge planning Services  CM Consult  Post Acute Care Choice:    Choice offered to:     DME Arranged:    DME Agency:     HH Arranged:    [place] Agency:     Status of Service:  Completed, signed off  Medicare Important Message Given:  Yes Date Medicare IM Given:  05/06/14 Medicare IM give by:  Leanne Chang Date Additional Medicare IM Given:   05/10/14 Additional Medicare Important Message give by:   Leanne Chang  If discussed at Long Length of Stay Meetings, dates discussed:    Additional Comments:  Scot Dock, RN 05/06/2014, 4:03 PM

## 2014-05-06 NOTE — Progress Notes (Signed)
[facility] PROGRESS NOTE  [name] [id number] DOB: 04/10/1965 DOA: 05/02/2014 PCP: Aura Dials, PA-C  Assessment/Plan: 49 y.o. female with PMH of HIV, IDDM, HTN, h/o Hip Avascular necrosis, Hip surgery and Asthma who presents the ED with complaints of severe left hip pain after falling 3 days ago. She reports that she has been taking increased amounts of Tylenol and Ibuprofen for her pain.She was evaluated in the ED and was found to have and elevated BUN/Cr of 30/4.07 from a [place] of 1.5. -admitted with AKI. L hip, and wrist fracture. Underwent ORIF hardware removal of L hip, Closed Reduction of Displaced Colles Fracture on the Left and application of a Short arm Cast on 5/4  1.  Acute renal failure likely secondary to NSAID use, Bactrim, Lasix, metformin on top of dehydration/prerenal; CT : no hydronephrosis  -renal function continues to improve on IVF. continue gentle hydration, strict I's and O's. hold nephrotoxins   2. H/o L hip arthoplasty, avascular hip necrosis. CT: Acute, comminuted left intertrochanteric fracture. -5/4: s/p ORIF hardware removal of L hip, Closed Reduction of Displaced Colles Fracture on the Left and application of a Short arm Cast. Per ortho  3. DM , ha1c-8.3. continue Lantus and sliding scale insulin. decreased Lantus to 35 QD on 5/4 due to hypoglycemia. Adjust as needed. D/c metformin due to renal issues  4. HIV. On antiretroviral    Code Status: full Family Communication: d/w patient (indicate person spoken with, relationship, and if by phone, the number) Disposition Plan: per surgery. SNF. ? Monday. Consult SW   Consultants:  ortho  Procedures: ORIF hardware removal of L hip, Closed Reduction of Displaced Colles Fracture on the Left and application of a Short arm Cast   Antibiotics:  none (indicate start date, and stop date if known)  HPI/Subjective: Alert, oriented   Objective: Filed Vitals:   05/06/14 0502  BP: 126/78   Pulse: 102  Temp: 99.7 F (37.6 C)  Resp:     Intake/Output Summary (Last 24 hours) at 05/06/14 1028 Last data filed at 05/06/14 1005  Gross per 24 hour  Intake 3831.42 ml  Output   2200 ml  Net 1631.42 ml   Filed Weights   05/03/14 0316  Weight: 106.1 kg (233 lb 14.5 oz)    Exam:   General:  No distress   Cardiovascular: s1,s2 rrr  Respiratory: CTA BL  Abdomen: soft, nt,nd   Musculoskeletal: no leg pitting edema   Data Reviewed: Basic Metabolic Panel:  Recent Labs Lab 05/02/14 2318 05/03/14 0535 05/04/14 0542 05/05/14 0533 05/06/14 0520  NA 134* 139 139 142 138  K 3.5 3.3* 3.4* 3.6 3.7  CL 107 107 111 112* 108  CO2 20* 22 22 22 23   GLUCOSE 250* 162* 121* 107* 88  BUN 39* 38* 31* 28* 19  CREATININE 4.07* 3.72* 2.70* 2.29* 1.82*  CALCIUM 8.1* 8.5* 8.6* 8.8* 8.4*   Liver Function Tests:  Recent Labs Lab 05/04/14 0542 05/05/14 0533 05/06/14 0520  AST 18 15 17   ALT 14 14 11*  ALKPHOS 169* 157* 144*  BILITOT 0.5 0.3 0.7  PROT 6.4* 6.2* 6.2*  ALBUMIN 3.0* 2.8* 2.9*   No results for input(s): LIPASE, AMYLASE in the last 168 hours. No results for input(s): AMMONIA in the last 168 hours. CBC:  Recent Labs Lab 05/02/14 2318 05/03/14 0535 05/04/14 0542 05/05/14 0533 05/05/14 1845 05/06/14 0520  WBC 11.3* 10.4 8.7 9.4  --  9.5  NEUTROABS 8.9*  --   --   --   --  7.6  HGB 9.1* 9.2* 8.9* 9.0* 8.5* 10.2*  HCT 29.7* 30.8* 29.3* 29.5* 27.9* 33.0*  MCV 90.0 91.1 91.6 91.0  --  90.4  PLT 249 251 253 275  --  228   Cardiac Enzymes: No results for input(s): CKTOTAL, CKMB, CKMBINDEX, TROPONINI in the last 168 hours. BNP (last 3 results) No results for input(s): BNP in the last 8760 hours.  ProBNP (last 3 results) No results for input(s): PROBNP in the last 8760 hours.  CBG:  Recent Labs Lab 05/05/14 1645 05/05/14 1726 05/05/14 1831 05/05/14 2320 05/06/14 0740  GLUCAP 63* 112* 118* 136* 87    Recent Results (from the past 240 hour(s))   Surgical pcr screen     Status: None   Collection Time: 05/04/14  9:16 PM  Result Value Ref Range Status   MRSA, PCR NEGATIVE NEGATIVE Final   Staphylococcus aureus NEGATIVE NEGATIVE Final    Comment:        The Xpert SA Assay (FDA approved for NASAL specimens in patients over 51 years of age), is one component of a comprehensive surveillance program.  Test performance has been validated by [facility] for patients greater than or equal to 68 year old. It is not intended to diagnose infection nor to guide or monitor treatment.   Anaerobic culture     Status: None (Preliminary result)   Collection Time: 05/05/14  4:46 PM  Result Value Ref Range Status   Specimen Description HIP LEFT  Final   Special Requests NONE  Final   Gram Stain   Final    NO WBC SEEN NO ORGANISMS SEEN Performed at [facility]    Culture   Final    NO ANAEROBES ISOLATED; CULTURE IN PROGRESS FOR 5 DAYS Performed at [facility]    Report Status PENDING  Incomplete  Body fluid culture     Status: None (Preliminary result)   Collection Time: 05/05/14  4:46 PM  Result Value Ref Range Status   Specimen Description HIP LEFT FLUID  Final   Special Requests NONE  Final   Gram Stain   Final    NO WBC SEEN NO ORGANISMS SEEN Performed at [facility]    Culture PENDING  Incomplete   Report Status PENDING  Incomplete     Studies: Dg Wrist 2 Views Left  05/05/2014   CLINICAL DATA:  Followup left wrist fracture.  Subsequent encounter.  EXAM: LEFT WRIST - 2 VIEW  COMPARISON:  05/03/2014  FINDINGS: Fractures of the distal radial metaphysis and ulnar styloid process are again seen and remain in near anatomic alignment. A fiberglass cast has been applied. Carpal bones remain normal in appearance alignment.  IMPRESSION: Fractures of the distal radial metaphysis and ulnar styloid process, which remain in near anatomic alignment.   Electronically Signed   By: Earle Gell M.D.   On: 05/05/2014  20:16   Ct Hip Left Wo Contrast  05/04/2014   CLINICAL DATA:  Left hip fracture.  EXAM: CT OF THE LEFT HIP WITHOUT CONTRAST  TECHNIQUE: Multidetector CT imaging of the left hip was performed according to the standard protocol. Multiplanar CT image reconstructions were also generated.  COMPARISON:  None.  FINDINGS: There is a comminuted left intertrochanteric fracture. There are 2 cannulated screws transfixing a healed femoral neck fracture. There is patchy sclerosis and lucency in the left femoral head which may reflect changes from bone remodeling secondary to fracture healing, but an element of avascular necrosis cannot be  excluded.  There is no other fracture or dislocation. There is no lytic or sclerotic osseous lesion. There is soft tissue contusion in the overlying subcutaneous fat. The muscles are normal. There is no hematoma or fluid collection.  IMPRESSION: 1. Acute, comminuted left intertrochanteric fracture. 2. Two cannulated screws transfixing a healed femoral neck fracture.   Electronically Signed   By: Kathreen Devoid   On: 05/04/2014 17:55   Dg Foot 2 Views Left  05/05/2014   CLINICAL DATA:  Osteomyelitis left foot.  EXAM: LEFT FOOT - 2 VIEW  COMPARISON:  None.  FINDINGS: There is soft tissue swelling of the left foot. No subcutaneous gas. No osseous erosion to localize osteomyelitis.  IMPRESSION: No radiographic evidence of osteomyelitis.   Electronically Signed   By: Suzy Bouchard M.D.   On: 05/05/2014 20:18   Dg Hip Port Unilat With Pelvis 1v Left  05/05/2014   CLINICAL DATA:  Fixation left intertrochanteric fracture.  EXAM: LEFT HIP (WITH PELVIS) 1 VIEW PORTABLE  COMPARISON:  CT scan left hip 05/04/2014.  FINDINGS: Previously seen fixation screws for a subcapital hip fracture have been removed. New cerclage wires are in place for fixation of an intertrochanteric fracture. The wires are intact. Position and alignment appear anatomic. Gas in the soft tissues and surgical staples are noted.   IMPRESSION: Status post fixation of a left intertrochanteric fracture without evidence of complication.  Status post removal of screws for fixation of remote healed subcapital fracture. No retained hardware.   Electronically Signed   By: Inge Rise M.D.   On: 05/05/2014 20:01    Scheduled Meds: . ARIPiprazole  2 mg Oral Daily  . atenolol  25 mg Oral q morning - 10a  . cholecalciferol  5,000 Units Oral Daily  . dextrose  25 mL Intravenous Once  . efavirenz  600 mg Oral QHS  . emtricitabine  200 mg Oral Q48H  . enoxaparin (LOVENOX) injection  40 mg Subcutaneous Q24H  . escitalopram  5 mg Oral Daily  . fenofibrate  160 mg Oral Daily  . insulin aspart  0-15 Units Subcutaneous TID WC  . insulin aspart  0-5 Units Subcutaneous QHS  . insulin glargine  35 Units Subcutaneous Daily  . pantoprazole  40 mg Oral q morning - 10a  . pregabalin  225 mg Oral BID  . rosuvastatin  20 mg Oral Daily  . tenofovir  300 mg Oral Q48H   Continuous Infusions: . dextrose 5 % and 0.9 % NaCl with KCl 20 mEq/L 75 mL/hr at 05/06/14 0515    Principal Problem:   ARF (acute renal failure) Active Problems:   Human immunodeficiency virus (HIV) disease   Diabetes mellitus type 2 with complications, uncontrolled   Left hip pain   Asthma   Hip pain    Time spent: >35 minutes     Kinnie Feil  [facility] Pager [phone]. If 7PM-7AM, please contact night-coverage at [email or web address], password [facility] 05/06/2014, 10:28 AM  LOS: 3 days

## 2014-05-06 NOTE — Clinical Documentation Improvement (Signed)
Please clarify documentation in the medical record of "debridement of left foot infection" as found in OP Note.  Please document the below (4) key elements in a progress note: ? Type of instrument used? ? Depth of debridement/type of tissue removed? (e.g., skin, subcutaneous tissue, fascia, muscle, bone, other) ? Did the debridement extend outside or beyond the wound margins? ? Excisional vs. nonexcisional? ? Irrigation of wound (e.g, Versajet)? ? Location of wound? ? Document the size of the debridement.  Thank You,  Zoila Shutter ,RN Clinical Documentation Specialist:  [place] [employer] [facility]

## 2014-05-07 LAB — BASIC METABOLIC PANEL
ANION GAP: 6 (ref 5–15)
BUN: 19 mg/dL (ref 6–20)
CHLORIDE: 113 mmol/L — AB (ref 101–111)
CO2: 19 mmol/L — AB (ref 22–32)
Calcium: 8 mg/dL — ABNORMAL LOW (ref 8.9–10.3)
Creatinine, Ser: 1.93 mg/dL — ABNORMAL HIGH (ref 0.44–1.00)
GFR calc non Af Amer: 29 mL/min — ABNORMAL LOW (ref >60)
GFR, EST AFRICAN AMERICAN: 34 mL/min — AB (ref >60)
Glucose, Bld: 187 mg/dL — ABNORMAL HIGH (ref 70–99)
Potassium: 3.4 mmol/L — ABNORMAL LOW (ref 3.5–5.1)
SODIUM: 138 mmol/L (ref 135–145)

## 2014-05-07 LAB — TYPE AND SCREEN
ABO/RH(D): O NEG
Antibody Screen: NEGATIVE
Unit division: 0
Unit division: 0

## 2014-05-07 LAB — GLUCOSE, CAPILLARY
GLUCOSE-CAPILLARY: 170 mg/dL — AB (ref 70–99)
Glucose-Capillary: 176 mg/dL — ABNORMAL HIGH (ref 70–99)
Glucose-Capillary: 183 mg/dL — ABNORMAL HIGH (ref 70–99)

## 2014-05-07 MED ORDER — POTASSIUM CHLORIDE CRYS ER 20 MEQ PO TBCR
40.0000 meq | EXTENDED_RELEASE_TABLET | Freq: Once | ORAL | Status: AC
  Administered 2014-05-07: 40 meq via ORAL
  Filled 2014-05-07: qty 2

## 2014-05-07 MED ORDER — SODIUM CHLORIDE 0.9 % IV BOLUS (SEPSIS)
500.0000 mL | Freq: Once | INTRAVENOUS | Status: AC
  Administered 2014-05-07: 500 mL via INTRAVENOUS

## 2014-05-07 NOTE — Progress Notes (Signed)
Pt BP 73/54, MD notified, BP meds held. Kizzie Ide, RN

## 2014-05-07 NOTE — Progress Notes (Addendum)
Bladder Scan performed yielding 608 mL. In&Out cath, 722mL.  Kizzie Ide, RN

## 2014-05-07 NOTE — Progress Notes (Signed)
CBG machine malfunctioning. CBG read 198, but not recorded. RN verified reading. Did not want to re-stick pt. Kizzie Ide, RN

## 2014-05-07 NOTE — Evaluation (Addendum)
Occupational Therapy Evaluation Patient Details Name: [name] MRN: [id number] DOB: 06/20/1965 Today's Date: 05/07/2014    History of Present Illness Pt fell 3 days prior to admission and had L hip pain.  s/p ORIF L hip with removal of 2 previous cannulated screws and closed reduction/casting to L wrist.  PMH:  HIV, IDDM, HTN   Clinical Impression   This 49 year old female was admitted for the above.  She was independent to mod I with ADLs prior to admission.  She will benefit from skilled OT to increase independence with adls.  Goals are for min A for standing for adls and LB bathing and min A +2 assistance for transfer to 3:1 commode.  Pt is NWB on LUE/LLE and casted on L arm.     Follow Up Recommendations  SNF    Equipment Recommendations  None recommended by OT    Recommendations for Other Services       Precautions / Restrictions Precautions Precaution Comments: Keep legs together when getting OOB.  OK to use maximove back to bed per Dr Gladstone Lighter Restrictions LLE Weight Bearing: Non weight bearing Other Position/Activity Restrictions: NWB of LLE and LUE.        Mobility Bed Mobility         Supine to sit: Mod assist;+2 for physical assistance     General bed mobility comments: assist for LUE and trunk.  Cues to keep LUE NWB through hand/wrist  Transfers   Equipment used: Left platform walker Transfers: Sit to/from Stand Sit to Stand: Mod assist;+2 physical assistance         General transfer comment: assist to rise and steady.  Transition slow first trial; improved second.  Cues for NWB/LLE placement and hand placement    Balance                                            ADL Overall ADL's : Needs assistance/impaired Eating/Feeding: Set up;Bed level;With adaptive utensils   Grooming: Oral care;Set up;Bed level;With adaptive equipment   Upper Body Bathing: Moderate assistance;Bed level   Lower Body Bathing: Total  assistance;+2 for physical assistance;Sit to/from stand   Upper Body Dressing : Minimal assistance;Sitting   Lower Body Dressing: Total assistance;+2 for physical assistance;Sit to/from stand                 General ADL Comments: Pt's R, dominant hand is bruised and affects ADLs.  Provided built up foam for utensils and tooth brush to decrease pain during self-feeding and brushing teeth.  Educated on AE--will benefit from long sponge & eventually reacher when hand heals.  Stood twice at EOB, but not ready for transfer.  Cleared with Dr Gladstone Lighter through RN Raquel Sarna) that pt could be lifted back to bed via maximove.  Recliner only has one side that will lower if we do a scooting transfer.  Pt needed mod cues not to weight bear through LUE during bed mobility and scooting     Vision     Perception     Praxis      Pertinent Vitals/Pain  L hip 9/10 RN brought meds, worked within pain tolerance, monitored and repositioned     Hand Dominance Right   Extremity/Trunk Assessment Upper Extremity Assessment Upper Extremity Assessment: RUE deficits/detail;LUE deficits/detail RUE Deficits / Details: bruising and pain throughout hand; otherwise WFLS LUE Deficits / Details:  casted; closed reduction, NWB           Communication Communication Communication: No difficulties   Cognition Arousal/Alertness: Awake/alert Behavior During Therapy: WFL for tasks assessed/performed Overall Cognitive Status: Within Functional Limits for tasks assessed                     General Comments       Exercises       Shoulder Instructions      Home Living Family/patient expects to be discharged to:: Private residence Living Arrangements: Parent   Type of Home: House Home Access: Stairs to enter Technical brewer of Steps: 5 Entrance Stairs-Rails: [place]: One level     Bathroom Shower/Tub: Occupational psychologist: [place]:  Environmental consultant - 2 wheels;Cane - quad;Wheelchair - manual;Bedside commode;Shower seat   Additional Comments: helps mother clean ostomy bag      Prior Functioning/Environment Level of Independence: Independent;Independent with assistive device(s)        Comments: mostly didnt use AD    OT Diagnosis: Acute pain;Generalized weakness   OT Problem List: Decreased strength;Decreased activity tolerance;Pain;Impaired UE functional use;Decreased knowledge of use of DME or AE;Decreased knowledge of precautions   OT Treatment/Interventions: Self-care/ADL training;DME and/or AE instruction;Patient/family education;Therapeutic activities    OT Goals(Current goals can be found in the care plan section) Acute Rehab OT Goals Patient Stated Goal: get healed; has 52 1/2 year old granddaughter  OT Goal Formulation: With patient Time For Goal Achievement: 05/14/14 Potential to Achieve Goals: Good ADL Goals Pt Will Transfer to Toilet: with min assist;with +2 assist;bedside commode;stand pivot transfer Additional ADL Goal #1: pt will stand statically with platform walker, NWB on LLE for 2 minutes with min guard for adls Additional ADL Goal #2: Pt will use reacher with min A for LB bathing, sit to stand  OT Frequency: Min 2X/week   Barriers to D/C:            Co-evaluation PT/OT/SLP Co-Evaluation/Treatment: Yes Reason for Co-Treatment: For patient/therapist safety PT goals addressed during session: Mobility/safety with mobility OT goals addressed during session: ADL's and self-care      End of Session    Activity Tolerance: Patient tolerated treatment well Patient left: in bed;with call bell/phone within reach   Time: 1422-1518 OT Time Calculation (min): 56 min (downtime getting equipment; Nursing getting meds/linen Charges:  OT General Charges $OT Visit: 1 Procedure OT Evaluation $Initial OT Evaluation Tier I: 1 Procedure G-Codes:    Sankalp Ferrell 05-09-14, 4:07 PM [place] with PT:  Goals  addressed by PT:  Mobility; goals addressed by OT:  Self care  Lesle Chris, OTR/L [phone] 2014/05/09

## 2014-05-07 NOTE — Progress Notes (Signed)
Patient ID: YEILIN RAPALO, female   DOB: [birth year], 49 y.o.   MRN: [id number]  [facility] PROGRESS NOTE  BRYNNLEA SNOWDEN [id number] DOB: 06/15/1965 DOA: 05/02/2014 PCP: Aura Dials, PA-C   Brief narrative:    49 y.o. female with PMH of HIV, IDDM, HTN, h/o Hip Avascular necrosis, Hip surgery and Asthma who presented the ED with complaints of severe left hip pain after falling 3 days PTA. She reported that she has been taking increased amounts of Tylenol and Ibuprofen for her pain.She was evaluated in the ED and was found to have and elevated BUN/Cr of 30/4.07 from a Cr of 1.5.  Admitted with AKI. L hip, and wrist fracture. Underwent ORIF hardware removal of L hip, Closed Reduction of Displaced Colles Fracture on the Left and application of a Short arm Cast on 5/4.  Assessment/Plan:    Principal Problem:   ARF (acute renal failure) - presumed to be secondary to NSAID use, Bactrim, lasix, metformin - IVF have been provided and Cr still in 1.8 - 1.9 range but overall down since admission  - will repeat BMP in AM Active Problems:   Human immunodeficiency virus (HIV) disease - continue ARV regimen    Diabetes mellitus type 2 with complications of neuropathy  - A1C 8.3, continue lantus and SSI - metformin still on hold until renal function stabilizes  - continue pregabalin    Left hip pain from acute left intertrochanteric fracture  - H/o L hip arthoplasty, avascular hip necrosis. CT: Acute, comminuted left intertrochanteric fracture. - 5/4: s/p ORIF hardware removal of L hip, Closed Reduction of Displaced Colles Fx on left, application of short arm cast. - Dressing changed on Left Hip and Left foot. Both areas look fine   Anemia of chronic disease, HIV with some post op bleed - Hg now up to 10.2, no signs of active bleeding  - cbc in AM   Hypokalemia - mild, will supplement and repeat BMP in AM   HTN - on atenolol - since BP in 90's this AM, will hold  antihypertensive regimen    HLD - continue statin    Morbid obesity - pt meets criteria with BMI 34.53 and underlying DM, HTN, HLD - Body mass index is 34.53 kg/(m^2).  DVT prophylaxis - Lovenox SQ  Code Status: Full.  Family Communication:  plan of care discussed with the patient Disposition Plan: D/C once ortho team clears for d/c  IV access:  Peripheral IV  Procedures and diagnostic studies:     ORIF hardware removal of L hip, Closed Reduction of Displaced Colles Fracture on the Left and application of a Short arm Cast   Ct Abdomen Pelvis Wo Contrast  05/03/2014   No acute findings in the abdomen or pelvis to account for the patient's symptoms. Specifically, no signs of bowel obstruction are noted at this time. 2. Normal appendix. 3. Atherosclerosis. 4. Status post ORIF in the left hip where there is a healing intertrochanteric hip fracture. 5. Mild hepatic steatosis.   Dg Chest 2 View  05/03/2014    No active cardiopulmonary disease.     Dg Wrist 2 Views Left  05/05/2014    Fractures of the distal radial metaphysis and ulnar styloid process, which remain in near anatomic alignment.    Dg Wrist 2 Views Left  05/03/2014  Nondisplaced ulnar styloid fracture.  Angulated and minimally displaced distal LEFT radial metaphyseal fracture.     Ct Hip Left Wo Contrast  05/04/2014  Acute, comminuted  left intertrochanteric fracture. 2. Two cannulated screws transfixing a healed femoral neck fracture.    Dg Foot 2 Views Left  05/05/2014  No radiographic evidence of osteomyelitis.     Dg Hips Bilat With Pelvis 2v  05/02/2014  No acute fracture of the left or right hip. 2. Internal fixation of left hip fracture again noted.     Dg Hip Port Unilat With Pelvis 1v Left  05/05/2014  Status post fixation of a left intertrochanteric fracture without evidence of complication.  Status post removal of screws for fixation of remote healed subcapital fracture. No retained hardware.    Medical Consultants:  Ortho    Other Consultants:  PT  IAnti-Infectives:   None   Faye Ramsay, MD  [facility] Pager [phone]  If 7PM-7AM, please contact night-coverage [email or web address] Password [email or web address] 05/07/2014, 5:45 PM   LOS: 4 days   HPI/Subjective: No events overnight. Still with left hip pain   Objective: Filed Vitals:   05/07/14 0814 05/07/14 1116 05/07/14 1210 05/07/14 1623  BP: 105/52 79/54 89/51  100/80  Pulse: 79  79   Temp: 98.4 F (36.9 C)  98.6 F (37 C)   TempSrc: Oral  Oral   Resp: 20  20   Height:      Weight:      SpO2: 93%  91%     Intake/Output Summary (Last 24 hours) at 05/07/14 1745 Last data filed at 05/07/14 0900  Gross per 24 hour  Intake 2251.25 ml  Output   1450 ml  Net 801.25 ml    Exam:   General:  Pt is alert, follows commands appropriately, not in acute distress  Cardiovascular: Regular rate and rhythm, no rubs, no gallops  Respiratory: Clear to auscultation bilaterally, no wheezing, no crackles, no rhonchi  Abdomen: Soft, non tender, non distended, bowel sounds present, no guarding  Extremities:Tenderness in left hip area    Data Reviewed: Basic Metabolic Panel:  Recent Labs Lab 05/03/14 0535 05/04/14 0542 05/05/14 0533 05/06/14 0520 05/07/14 0606  NA 139 139 142 138 138  K 3.3* 3.4* 3.6 3.7 3.4*  CL 107 111 112* 108 113*  CO2 22 22 22 23  19*  GLUCOSE 162* 121* 107* 88 187*  BUN 38* 31* 28* 19 19  CREATININE 3.72* 2.70* 2.29* 1.82* 1.93*  CALCIUM 8.5* 8.6* 8.8* 8.4* 8.0*   Liver Function Tests:  Recent Labs Lab 05/04/14 0542 05/05/14 0533 05/06/14 0520  AST 18 15 17   ALT 14 14 11*  ALKPHOS 169* 157* 144*  BILITOT 0.5 0.3 0.7  PROT 6.4* 6.2* 6.2*  ALBUMIN 3.0* 2.8* 2.9*   CBC:  Recent Labs Lab 05/02/14 2318 05/03/14 0535 05/04/14 0542 05/05/14 0533 05/05/14 1845 05/06/14 0520  WBC 11.3* 10.4 8.7 9.4  --  9.5  NEUTROABS 8.9*  --   --   --   --  7.6  HGB 9.1* 9.2* 8.9* 9.0* 8.5* 10.2*  HCT 29.7* 30.8* 29.3* 29.5* 27.9*  33.0*  MCV 90.0 91.1 91.6 91.0  --  90.4  PLT 249 251 253 275  --  228   CBG:  Recent Labs Lab 05/06/14 1239 05/06/14 1638 05/06/14 2123 05/07/14 0808 05/07/14 1640  GLUCAP 156* 180* 215* 170* 176*    Recent Results (from the past 240 hour(s))  Surgical pcr screen     Status: None   Collection Time: 05/04/14  9:16 PM  Result Value Ref Range Status   MRSA, PCR NEGATIVE NEGATIVE Final   Staphylococcus aureus NEGATIVE NEGATIVE Final  Anaerobic culture     Status: None (Preliminary result)   Collection Time: 05/05/14  4:46 PM  Result Value Ref Range Status   Specimen Description HIP LEFT  Final   Special Requests NONE  Final   Gram Stain   Final    NO WBC SEEN NO ORGANISMS SEEN Performed at [facility]    Culture   Final    NO ANAEROBES ISOLATED; CULTURE IN PROGRESS FOR 5 DAYS Performed at [facility]    Report Status PENDING  Incomplete  Body fluid culture     Status: None (Preliminary result)   Collection Time: 05/05/14  4:46 PM  Result Value Ref Range Status   Specimen Description HIP LEFT FLUID  Final   Special Requests NONE  Final   Gram Stain   Final    NO WBC SEEN NO ORGANISMS SEEN Performed at [facility]    Culture   Final    NO GROWTH 1 DAY Performed at [facility]    Report Status PENDING  Incomplete     Scheduled Meds: . ARIPiprazole  2 mg Oral Daily  . cholecalciferol  5,000 Units Oral Daily  . dextrose  25 mL Intravenous Once  . efavirenz  600 mg Oral QHS  . emtricitabine  200 mg Oral Q48H  . enoxaparin (LOVENOX) injection  40 mg Subcutaneous Q24H  . escitalopram  5 mg Oral Daily  . fenofibrate  160 mg Oral Daily  . insulin aspart  0-15 Units Subcutaneous TID WC  . insulin aspart  0-5 Units Subcutaneous QHS  . insulin glargine  35 Units Subcutaneous Daily  . pantoprazole  40 mg Oral q morning - 10a  . pregabalin  225 mg Oral BID  . rosuvastatin  20 mg Oral Daily  . tenofovir  300 mg Oral Q48H    Continuous Infusions: . dextrose 5 % and 0.9 % NaCl with KCl 20 mEq/L Stopped (05/07/14 1459)

## 2014-05-07 NOTE — Progress Notes (Signed)
Subjective: 2 Days Post-Op Procedure(s) (LRB): ARTHROPLASTY OPEN REDUCTION INTERNAL FIXATION LEFT HIP AND REMOVAL OF TWO CANNULATED SCREW (Left) CAST APPLICATION (FIBERGLASS) (Left) HARDWARE REMOVAL LEFT HIP (Left) Patient reports pain as 3 on 0-10 scale.  Doing well .Dressing changed on Left Hip and Left foot. Both areas look fine. 'Debridement" in OR was simply with Betadine dressings and swabs. No instruments were used.Plan to get up from bed to chair,non-Wght. Bearing.She received 2-units of Packed cells Post-Op because of low HBg Pre-Op and amount of bleeding during surgery and the fact that she is on an Anticoagulant.  She is stable now.  Objective: Vital signs in last 24 hours: Temp:  [99.2 F (37.3 C)-99.3 F (37.4 C)] 99.3 F (37.4 C) (05/06 0634) Pulse Rate:  [83-95] 83 (05/06 0634) Resp:  [20] 20 (05/06 0634) BP: (103-132)/(49-91) 103/49 mmHg (05/06 0634) SpO2:  [93 %-95 %] 95 % (05/06 0634)  Intake/Output from previous day: 05/05 0701 - 05/06 0700 In: 2301.3 [P.O.:770; I.V.:1531.3] Out: 2250 [Urine:2250] Intake/Output this shift:     Recent Labs  05/05/14 0533 05/05/14 1845 05/06/14 0520  HGB 9.0* 8.5* 10.2*    Recent Labs  05/05/14 0533 05/05/14 1845 05/06/14 0520  WBC 9.4  --  9.5  RBC 3.24*  --  3.65*  HCT 29.5* 27.9* 33.0*  PLT 275  --  228    Recent Labs  05/06/14 0520 05/07/14 0606  NA 138 138  K 3.7 3.4*  CL 108 113*  CO2 23 19*  BUN 19 19  CREATININE 1.82* 1.93*  GLUCOSE 88 187*  CALCIUM 8.4* 8.0*   No results for input(s): LABPT, INR in the last 72 hours.  Neurologically intact No cellulitis present Compartment soft  Assessment/Plan: 2 Days Post-Op Procedure(s) (LRB): ARTHROPLASTY OPEN REDUCTION INTERNAL FIXATION LEFT HIP AND REMOVAL OF TWO CANNULATED SCREW (Left) CAST APPLICATION (FIBERGLASS) (Left) HARDWARE REMOVAL LEFT HIP (Left) Up with therapy,Bed to Chair and Non-Weight bearing.  [name] A 05/07/2014, 7:36 AM

## 2014-05-08 ENCOUNTER — Inpatient Hospital Stay (HOSPITAL_COMMUNITY): Payer: Medicare HMO

## 2014-05-08 LAB — BASIC METABOLIC PANEL
Anion gap: 8 (ref 5–15)
BUN: 22 mg/dL — ABNORMAL HIGH (ref 6–20)
CO2: 20 mmol/L — ABNORMAL LOW (ref 22–32)
Calcium: 8.4 mg/dL — ABNORMAL LOW (ref 8.9–10.3)
Chloride: 111 mmol/L (ref 101–111)
Creatinine, Ser: 1.87 mg/dL — ABNORMAL HIGH (ref 0.44–1.00)
GFR, EST AFRICAN AMERICAN: 35 mL/min — AB (ref >60)
GFR, EST NON AFRICAN AMERICAN: 30 mL/min — AB (ref >60)
GLUCOSE: 219 mg/dL — AB (ref 70–99)
POTASSIUM: 3.5 mmol/L (ref 3.5–5.1)
SODIUM: 139 mmol/L (ref 135–145)

## 2014-05-08 LAB — URINE CULTURE
Colony Count: NO GROWTH
Culture: NO GROWTH

## 2014-05-08 LAB — CBC
HCT: 28.6 % — ABNORMAL LOW (ref 36.0–46.0)
HEMOGLOBIN: 9.1 g/dL — AB (ref 12.0–15.0)
MCH: 29.2 pg (ref 26.0–34.0)
MCHC: 31.8 g/dL (ref 30.0–36.0)
MCV: 91.7 fL (ref 78.0–100.0)
Platelets: 280 10*3/uL (ref 150–400)
RBC: 3.12 MIL/uL — AB (ref 3.87–5.11)
RDW: 19.5 % — ABNORMAL HIGH (ref 11.5–15.5)
WBC: 11 10*3/uL — ABNORMAL HIGH (ref 4.0–10.5)

## 2014-05-08 LAB — GLUCOSE, CAPILLARY
GLUCOSE-CAPILLARY: 217 mg/dL — AB (ref 70–99)
Glucose-Capillary: 171 mg/dL — ABNORMAL HIGH (ref 70–99)
Glucose-Capillary: 167 mg/dL — ABNORMAL HIGH (ref 70–99)
Glucose-Capillary: 203 mg/dL — ABNORMAL HIGH (ref 70–99)

## 2014-05-08 MED ORDER — POTASSIUM CHLORIDE CRYS ER 20 MEQ PO TBCR
40.0000 meq | EXTENDED_RELEASE_TABLET | Freq: Once | ORAL | Status: AC
  Administered 2014-05-08: 40 meq via ORAL
  Filled 2014-05-08: qty 2

## 2014-05-08 MED ORDER — HYDROMORPHONE HCL 1 MG/ML IJ SOLN
0.5000 mg | Freq: Once | INTRAMUSCULAR | Status: AC
  Administered 2014-05-08: 0.5 mg via INTRAVENOUS
  Filled 2014-05-08: qty 1

## 2014-05-08 NOTE — Progress Notes (Signed)
Physical Therapy Treatment Patient Details Name: [name] MRN: [id number] DOB: [birth year] Today's Date: 05/08/2014    History of Present Illness Pt fell 3 days prior to admission and had L hip pain.  s/p ORIF L hip with removal of 2 previous cannulated screws and closed reduction/casting to L wrist.  PMH:  HIV, IDDM, HTN    PT Comments    Pt cooperative but progressing slowly 2* NWB status of L LE and hand as well as premorbid obesity limiting pt ability to mobilize self.  Follow Up Recommendations  SNF     Equipment Recommendations       Recommendations for Other Services OT consult     Precautions / Restrictions Precautions Precautions: Fall Precaution Comments: Keep legs together when getting OOB.  OK to use maximove back to bed per Dr Gladstone Lighter Restrictions Weight Bearing Restrictions: Yes LUE Weight Bearing: Non weight bearing LLE Weight Bearing: Non weight bearing Other Position/Activity Restrictions: NWB L LE and L hand - pt may WB on L elbow - ie using platform walker    Mobility  Bed Mobility Overal bed mobility: Needs Assistance;+2 for physical assistance Bed Mobility: Rolling;Supine to Sit;Sit to Supine Rolling: Mod assist;+2 for physical assistance   Supine to sit: Mod assist;+2 for physical assistance Sit to supine: Mod assist;Max assist;+2 for physical assistance;+2 for safety/equipment   General bed mobility comments: assist for LUE and trunk.  Cues to keep LUE NWB through hand/wrist  Transfers Overall transfer level: Needs assistance Equipment used: Left platform walker Transfers: Sit to/from Stand Sit to Stand: From elevated surface;+2 physical assistance;Mod assist         General transfer comment: assist to rise and steady.  Cues for NWB/LLE placement and hand placement  Increased assist required off BSC vs elevated bed  Ambulation/Gait   Ambulation Distance (Feet): 0 Feet Assistive device: Left platform walker       General Gait  Details: Pt stood x 2, maintaining standing 2 90 seconds each time.     Stairs            Wheelchair Mobility    Modified Rankin (Stroke Patients Only)       Balance Overall balance assessment: Needs assistance Sitting-balance support: Single extremity supported;Feet supported Sitting balance-Leahy Scale: Good Sitting balance - Comments: pt sat on EOB x 5 minutes without LOB     Standing balance-Leahy Scale: Poor                      Cognition Arousal/Alertness: Awake/alert Behavior During Therapy: WFL for tasks assessed/performed Overall Cognitive Status: Within Functional Limits for tasks assessed                      Exercises General Exercises - Lower Extremity Ankle Circles/Pumps: AROM;Both;15 reps;Supine Long Arc Quad: AAROM;Left;10 reps;Seated    General Comments        Pertinent Vitals/Pain Pain Assessment: 0-10 Pain Score: 5  Pain Location: L hip and hand Pain Descriptors / Indicators: Aching;Sore Pain Intervention(s): Limited activity within patient's tolerance;Monitored during session;Premedicated before session    Home Living                      Prior Function            PT Goals (current goals can now be found in the care plan section) Acute Rehab PT Goals Patient Stated Goal: get healed; has 23 1/2 year old granddaughter  PT  Goal Formulation: With patient Time For Goal Achievement: 05/18/14 Potential to Achieve Goals: Fair Progress towards PT goals: Progressing toward goals    Frequency  Min 3X/week    PT Plan Current plan remains appropriate    Co-evaluation             End of Session Equipment Utilized During Treatment: Gait belt Activity Tolerance: Patient tolerated treatment well Patient left: in bed;with call bell/phone within reach;with nursing/sitter in room     Time: 1630-1705 PT Time Calculation (min) (ACUTE ONLY): 35 min  Charges:  $Therapeutic Activity: 23-37 mins                     G Codes:      Kohei Antonellis 05-31-14, 5:34 PM

## 2014-05-08 NOTE — Progress Notes (Signed)
Subjective: 3 Days Post-Op Procedure(s) (LRB): ARTHROPLASTY OPEN REDUCTION INTERNAL FIXATION LEFT HIP AND REMOVAL OF TWO CANNULATED SCREW (Left) CAST APPLICATION (FIBERGLASS) (Left) HARDWARE REMOVAL LEFT HIP (Left) Patient reports pain as 2 on 0-10 scale.  Despite two units of packed cells her HBg dropped to 9.1. Will follow.[facility] be [facility] but no weight on left lower.  Objective: Vital signs in last 24 hours: Temp:  [98.4 F (36.9 C)-98.8 F (37.1 C)] 98.5 F (36.9 C) (05/07 0539) Pulse Rate:  [79-98] 98 (05/07 0539) Resp:  [20] 20 (05/07 0539) BP: (79-118)/(48-80) 115/55 mmHg (05/07 0539) SpO2:  [91 %-95 %] 93 % (05/07 0539)  Intake/Output from previous day: 05/06 0701 - 05/07 0700 In: 480 [P.O.:480] Out: 700 [Urine:700] Intake/Output this shift:     Recent Labs  05/05/14 1845 05/06/14 0520 05/08/14 0540  HGB 8.5* 10.2* 9.1*    Recent Labs  05/06/14 0520 05/08/14 0540  WBC 9.5 11.0*  RBC 3.65* 3.12*  HCT 33.0* 28.6*  PLT 228 280    Recent Labs  05/07/14 0606 05/08/14 0540  NA 138 139  K 3.4* 3.5  CL 113* 111  CO2 19* 20*  BUN 19 22*  CREATININE 1.93* 1.87*  GLUCOSE 187* 219*  CALCIUM 8.0* 8.4*   No results for input(s): LABPT, INR in the last 72 hours.  No cellulitis present Compartment soft  Assessment/Plan: 3 Days Post-Op Procedure(s) (LRB): ARTHROPLASTY OPEN REDUCTION INTERNAL FIXATION LEFT HIP AND REMOVAL OF TWO CANNULATED SCREW (Left) CAST APPLICATION (FIBERGLASS) (Left) HARDWARE REMOVAL LEFT HIP (Left) Up with therapy discontinued to SNF on Monday.  Wyat Infinger A 05/08/2014, 7:43 AM

## 2014-05-08 NOTE — Progress Notes (Signed)
Patient ID: [name], female   DOB: 07-19-1965, 49 y.o.   MRN: [id number]  [facility] PROGRESS NOTE  [name] [id number] DOB: [birth year] DOA: 05/02/2014 PCP: Aura Dials, PA-C   Brief narrative:    49 y.o. female with PMH of HIV, IDDM, HTN, h/o Hip Avascular necrosis, Hip surgery and Asthma who presented the ED with complaints of severe left hip pain after falling 3 days PTA. She reported that she has been taking increased amounts of Tylenol and Ibuprofen for her pain.She was evaluated in the ED and was found to have and elevated BUN/Cr of 30/4.07 from a Cr of 1.5.  Admitted with AKI. L hip, and wrist fracture. Underwent ORIF hardware removal of L hip, Closed Reduction of Displaced Colles Fracture on the Left and application of a Short arm Cast on 5/4.  Assessment/Plan:    Principal Problem:   ARF (acute renal failure) - presumed to be secondary to NSAID use, Bactrim, lasix, metformin - IVF have been provided and Cr still in 1.8 - 1.9 range but overall down since admission  - d/c IVF to see how pt does as she is tolerating PO intake  - will repeat BMP in AM Active Problems:   Human immunodeficiency virus (HIV) disease - continue ARV regimen    Diabetes mellitus type 2 with complications of neuropathy  - A1C 8.3, continue lantus and SSI - metformin still on hold until renal function stabilizes, will notify PCP - continue pregabalin    Left hip pain from acute left intertrochanteric fracture  - H/o L hip arthoplasty, avascular hip necrosis. CT: Acute, comminuted left intertrochanteric fracture. - 5/4: s/p ORIF hardware removal of L hip, Closed Reduction of Displaced Colles Fx on left, application of short arm cast. - Dressing changed on Left Hip and Left foot 5/6 - appreciate ortho team assistance    Anemia of chronic disease, HIV with some post op bleed - Hg down over the past 24 hours from 10.2 --> 9.1 - no signs of active bleeding, no indication for  transfusion at this time  - cbc in AM   Hypokalemia - still on low end of normal, will supplement today and repeat in AM   HTN - on atenolol - has been held due to low BP   HLD - continue statin    Morbid obesity - pt meets criteria with BMI 34.53 and underlying DM, HTN, HLD - Body mass index is 34.53 kg/(m^2).  DVT prophylaxis - Lovenox SQ  Code Status: Full.  Family Communication:  plan of care discussed with the patient Disposition Plan: D/C Monday to SNF  IV access:  Peripheral IV  Procedures and diagnostic studies:     ORIF hardware removal of L hip, Closed Reduction of Displaced Colles Fracture on the Left and application of a Short arm Cast   Ct Abdomen Pelvis Wo Contrast  05/03/2014   No acute findings in the abdomen or pelvis to account for the patient's symptoms. Specifically, no signs of bowel obstruction are noted at this time. 2. Normal appendix. 3. Atherosclerosis. 4. Status post ORIF in the left hip where there is a healing intertrochanteric hip fracture. 5. Mild hepatic steatosis.   Dg Chest 2 View  05/03/2014    No active cardiopulmonary disease.     Dg Wrist 2 Views Left  05/05/2014    Fractures of the distal radial metaphysis and ulnar styloid process, which remain in near anatomic alignment.    Dg Wrist 2 Views  Left  05/03/2014  Nondisplaced ulnar styloid fracture.  Angulated and minimally displaced distal LEFT radial metaphyseal fracture.     Ct Hip Left Wo Contrast  05/04/2014  Acute, comminuted left intertrochanteric fracture. 2. Two cannulated screws transfixing a healed femoral neck fracture.    Dg Foot 2 Views Left  05/05/2014  No radiographic evidence of osteomyelitis.     Dg Hips Bilat With Pelvis 2v  05/02/2014  No acute fracture of the left or right hip. 2. Internal fixation of left hip fracture again noted.     Dg Hip Port Unilat With Pelvis 1v Left  05/05/2014  Status post fixation of a left intertrochanteric fracture without evidence of complication.  Status  post removal of screws for fixation of remote healed subcapital fracture. No retained hardware.    Medical Consultants:  Ortho   Other Consultants:  PT  IAnti-Infectives:   None   Faye Ramsay, MD  [facility] Pager [phone]  If 7PM-7AM, please contact night-coverage [email or web address] Password [facility] 05/08/2014, 11:49 AM   LOS: 5 days   HPI/Subjective: No events overnight. Still with left hip pain and left arm pain, 5/10 in severity   Objective: Filed Vitals:   05/07/14 1623 05/07/14 2111 05/08/14 0223 05/08/14 0539  BP: 100/80 92/48 118/69 115/55  Pulse:  85 89 98  Temp:  98.7 F (37.1 C) 98.8 F (37.1 C) 98.5 F (36.9 C)  TempSrc:  Oral Oral Oral  Resp:  20 20 20   Height:      Weight:      SpO2:  95% 95% 93%    Intake/Output Summary (Last 24 hours) at 05/08/14 1149 Last data filed at 05/08/14 0900  Gross per 24 hour  Intake    480 ml  Output   1300 ml  Net   -820 ml    Exam:   General:  Pt is alert, follows commands appropriately, not in acute distress  Cardiovascular: Regular rate and rhythm, no rubs, no gallops  Respiratory: Clear to auscultation bilaterally, no wheezing, no crackles, no rhonchi  Abdomen: Soft, non tender, non distended, bowel sounds present, no guarding  Extremities:Tenderness in left hip area    Data Reviewed: Basic Metabolic Panel:  Recent Labs Lab 05/04/14 0542 05/05/14 0533 05/06/14 0520 05/07/14 0606 05/08/14 0540  NA 139 142 138 138 139  K 3.4* 3.6 3.7 3.4* 3.5  CL 111 112* 108 113* 111  CO2 22 22 23  19* 20*  GLUCOSE 121* 107* 88 187* 219*  BUN 31* 28* 19 19 22*  CREATININE 2.70* 2.29* 1.82* 1.93* 1.87*  CALCIUM 8.6* 8.8* 8.4* 8.0* 8.4*   Liver Function Tests:  Recent Labs Lab 05/04/14 0542 05/05/14 0533 05/06/14 0520  AST 18 15 17   ALT 14 14 11*  ALKPHOS 169* 157* 144*  BILITOT 0.5 0.3 0.7  PROT 6.4* 6.2* 6.2*  ALBUMIN 3.0* 2.8* 2.9*   CBC:  Recent Labs Lab 05/02/14 2318 05/03/14 0535  05/04/14 0542 05/05/14 0533 05/05/14 1845 05/06/14 0520 05/08/14 0540  WBC 11.3* 10.4 8.7 9.4  --  9.5 11.0*  NEUTROABS 8.9*  --   --   --   --  7.6  --   HGB 9.1* 9.2* 8.9* 9.0* 8.5* 10.2* 9.1*  HCT 29.7* 30.8* 29.3* 29.5* 27.9* 33.0* 28.6*  MCV 90.0 91.1 91.6 91.0  --  90.4 91.7  PLT 249 251 253 275  --  228 280   CBG:  Recent Labs Lab 05/06/14 2123 05/07/14 0808 05/07/14 1640 05/07/14 2138 05/08/14  0746  GLUCAP 215* 170* 176* 183* 171*    Recent Results (from the past 240 hour(s))  Surgical pcr screen     Status: None   Collection Time: 05/04/14  9:16 PM  Result Value Ref Range Status   MRSA, PCR NEGATIVE NEGATIVE Final   Staphylococcus aureus NEGATIVE NEGATIVE Final  Anaerobic culture     Status: None (Preliminary result)   Collection Time: 05/05/14  4:46 PM  Result Value Ref Range Status   Specimen Description HIP LEFT  Final   Special Requests NONE  Final   Gram Stain   Final    NO WBC SEEN NO ORGANISMS SEEN Performed at [facility]    Culture   Final    NO ANAEROBES ISOLATED; CULTURE IN PROGRESS FOR 5 DAYS Performed at [facility]    Report Status PENDING  Incomplete  Body fluid culture     Status: None (Preliminary result)   Collection Time: 05/05/14  4:46 PM  Result Value Ref Range Status   Specimen Description HIP LEFT FLUID  Final   Special Requests NONE  Final   Gram Stain   Final    NO WBC SEEN NO ORGANISMS SEEN Performed at [facility]    Culture   Final    NO GROWTH 1 DAY Performed at [facility]    Report Status PENDING  Incomplete     Scheduled Meds: . ARIPiprazole  2 mg Oral Daily  . cholecalciferol  5,000 Units Oral Daily  . dextrose  25 mL Intravenous Once  . efavirenz  600 mg Oral QHS  . emtricitabine  200 mg Oral Q48H  . enoxaparin (LOVENOX) injection  40 mg Subcutaneous Q24H  . escitalopram  5 mg Oral Daily  . fenofibrate  160 mg Oral Daily  . insulin aspart  0-15 Units Subcutaneous TID  WC  . insulin aspart  0-5 Units Subcutaneous QHS  . insulin glargine  35 Units Subcutaneous Daily  . pantoprazole  40 mg Oral q morning - 10a  . pregabalin  225 mg Oral BID  . rosuvastatin  20 mg Oral Daily  . tenofovir  300 mg Oral Q48H   Continuous Infusions: . dextrose 5 % and 0.9 % NaCl with KCl 20 mEq/L 75 mL/hr at 05/08/14 0146

## 2014-05-08 NOTE — Clinical Social Work Note (Signed)
CSW reviewed pt chart for possible discharge this weekend  MD note dated 05/08/14 reflects pt may be ready for discharge on Monday  CSW will continue to follow pt until discharge  .Dede Query, LCSW [facility] Clinical Social Worker - Weekend Coverage cell #: [phone]

## 2014-05-09 ENCOUNTER — Inpatient Hospital Stay (HOSPITAL_COMMUNITY): Payer: Medicare HMO

## 2014-05-09 LAB — GLUCOSE, CAPILLARY
Glucose-Capillary: 148 mg/dL — ABNORMAL HIGH (ref 70–99)
Glucose-Capillary: 138 mg/dL — ABNORMAL HIGH (ref 70–99)
Glucose-Capillary: 204 mg/dL — ABNORMAL HIGH (ref 70–99)
Glucose-Capillary: 167 mg/dL — ABNORMAL HIGH (ref 70–99)

## 2014-05-09 LAB — URINALYSIS, ROUTINE W REFLEX MICROSCOPIC
Bilirubin Urine: NEGATIVE
Glucose, UA: >1000 mg/dL — AB
KETONES UR: NEGATIVE mg/dL
LEUKOCYTES UA: NEGATIVE
Nitrite: NEGATIVE
PH: 6 (ref 5.0–8.0)
PROTEIN: 30 mg/dL — AB
Specific Gravity, Urine: 1.014 (ref 1.005–1.030)
Urobilinogen, UA: 0.2 mg/dL (ref 0.0–1.0)

## 2014-05-09 LAB — BASIC METABOLIC PANEL
ANION GAP: 9 (ref 5–15)
BUN: 23 mg/dL — AB (ref 6–20)
CO2: 20 mmol/L — ABNORMAL LOW (ref 22–32)
CREATININE: 1.84 mg/dL — AB (ref 0.44–1.00)
Calcium: 8.8 mg/dL — ABNORMAL LOW (ref 8.9–10.3)
Chloride: 110 mmol/L (ref 101–111)
GFR calc Af Amer: 36 mL/min — ABNORMAL LOW (ref >60)
GFR, EST NON AFRICAN AMERICAN: 31 mL/min — AB (ref >60)
Glucose, Bld: 164 mg/dL — ABNORMAL HIGH (ref 70–99)
Potassium: 3.5 mmol/L (ref 3.5–5.1)
Sodium: 139 mmol/L (ref 135–145)

## 2014-05-09 LAB — BODY FLUID CULTURE
CULTURE: NO GROWTH
Gram Stain: NONE SEEN

## 2014-05-09 LAB — LACTIC ACID, PLASMA: LACTIC ACID, VENOUS: 0.7 mmol/L (ref 0.5–2.0)

## 2014-05-09 LAB — CBC
HEMATOCRIT: 28.3 % — AB (ref 36.0–46.0)
HEMOGLOBIN: 8.9 g/dL — AB (ref 12.0–15.0)
MCH: 28.5 pg (ref 26.0–34.0)
MCHC: 31.4 g/dL (ref 30.0–36.0)
MCV: 90.7 fL (ref 78.0–100.0)
Platelets: 330 10*3/uL (ref 150–400)
RBC: 3.12 MIL/uL — ABNORMAL LOW (ref 3.87–5.11)
RDW: 18.8 % — ABNORMAL HIGH (ref 11.5–15.5)
WBC: 11.6 10*3/uL — ABNORMAL HIGH (ref 4.0–10.5)

## 2014-05-09 LAB — URINE MICROSCOPIC-ADD ON

## 2014-05-09 MED ORDER — ENOXAPARIN SODIUM 40 MG/0.4ML ~~LOC~~ SOLN: 40.0000 mg | SUBCUTANEOUS | Status: DC

## 2014-05-09 MED ORDER — GLUCERNA SHAKE PO LIQD
237.0000 mL | Freq: Three times a day (TID) | ORAL | Status: DC
  Administered 2014-05-09 – 2014-05-12 (×7): 237 mL via ORAL
  Filled 2014-05-09 (×11): qty 237

## 2014-05-09 MED ORDER — PREGABALIN 50 MG PO CAPS
100.0000 mg | ORAL_CAPSULE | Freq: Two times a day (BID) | ORAL | Status: DC
  Administered 2014-05-10 – 2014-05-12 (×6): 100 mg via ORAL
  Filled 2014-05-09: qty 2
  Filled 2014-05-09 (×2): qty 4
  Filled 2014-05-09 (×3): qty 2

## 2014-05-09 MED ORDER — ASPIRIN EC 325 MG PO TBEC: 325.0000 mg | DELAYED_RELEASE_TABLET | Freq: Two times a day (BID) | ORAL | Status: AC

## 2014-05-09 MED ORDER — OXYCODONE HCL 5 MG PO TABS: 5.0000 mg | ORAL_TABLET | ORAL | Status: DC | PRN

## 2014-05-09 NOTE — Progress Notes (Signed)
Initial Nutrition Assessment  DOCUMENTATION CODES:  Obesity unspecified  INTERVENTION:  Glucerna shake BID RD to follow-up for any education needs  NUTRITION DIAGNOSIS:  Increased nutrient needs related to wound healing as evidenced by estimated needs.  GOAL:  Patient will meet greater than or equal to 90% of their needs  MONITOR:  PO intake, Supplement acceptance, Labs, Weight trends, Skin, I & O's  REASON FOR ASSESSMENT:  Consult Assessment of nutrition requirement/status  ASSESSMENT: 49 y.o. female with PMH of HIV, IDDM, HTN, h/o Hip Avascular necrosis, Hip surgery and Asthma who presented the ED with complaints of severe left hip pain after falling 3 days PTA.  S/p 5/4 Procedure(s) (LRB): ARTHROPLASTY OPEN REDUCTION INTERNAL FIXATION LEFT HIP AND REMOVAL OF TWO CANNULATED SCREW (Left) CAST APPLICATION (FIBERGLASS) (Left) HARDWARE REMOVAL LEFT HIP (Left)  RD attempted to speak with patient in room but patient asleep and could not be roused at the time. Pt shows no signs of muscle or fat depletion. Per weight history documentation, pt has had 17 lb weight loss over last 11 months, insignificant for time frame.  Pt with poor PO intake: 35% of HH/CHO diet. RD to order Glucerna Shake BID for patient. RD to follow- up for supplemental acceptance and any education needs.  Labs reviewed: Elevated BUN & Creatinine CBGs: 148-203  Height:  Ht Readings from Last 1 Encounters:  05/03/14 5\' 9"  (1.753 m)    Weight:  Wt Readings from Last 1 Encounters:  05/03/14 233 lb 14.5 oz (106.1 kg)    Ideal Body Weight:  61.4 kg  Wt Readings from Last 10 Encounters:  05/03/14 233 lb 14.5 oz (106.1 kg)  05/04/14 233 lb (105.688 kg)  12/23/13 223 lb (101.152 kg)  11/24/13 232 lb (105.235 kg)  06/08/13 250 lb (113.399 kg)  01/06/13 257 lb (116.574 kg)  07/25/12 266 lb 15.6 oz (121.1 kg)  07/03/12 265 lb (120.203 kg)  04/22/12 263 lb (119.296 kg)  04/09/12 247 lb (112.038 kg)     BMI:  Body mass index is 34.53 kg/(m^2).  Estimated Nutritional Needs:  Kcal:  1600-1800  Protein:  75-85g  Fluid:  1.6L/day     Skin:  Wound (see comment) (surgical incisions)  Diet Order:  Diet heart healthy/carb modified Room service appropriate?: Yes; Fluid consistency:: Thin  EDUCATION NEEDS:  No education needs identified at this time   Intake/Output Summary (Last 24 hours) at 05/09/14 1305 Last data filed at 05/08/14 1847  Gross per 24 hour  Intake    840 ml  Output      0 ml  Net    840 ml    Last BM:  4/29 -9 days w/o BM  Clayton Bibles, MS, RD, LDN Pager: [phone] After Hours Pager: [phone]

## 2014-05-09 NOTE — Discharge Instructions (Signed)
Orthopedic Instructions Nonweightbearing left leg and left arm Take Lovenox to prevent blood clots. Complete 14 days of Lovenox from surgery day.  Start aspirin regimen 325mg  twice daily for two weeks after completion of Lovenox course.  Change dressing over left foot and left hip daily  Shower or sponge bath only, no tub bath. Do not submerge the incision in water. Do not get the cast wet.  Call if any temperatures greater than 101 or any wound complications: [phone] during the day and ask for Dr. Charlestine Night nurse, Brunilda Payor.

## 2014-05-09 NOTE — Progress Notes (Signed)
Patient ID: [name], female   DOB: Jul 05, 1965, 49 y.o.   MRN: [id number]  [facility] PROGRESS NOTE  [name] [id number] DOB: 02-17-1965 DOA: 05/02/2014 PCP: Aura Dials, PA-C   Brief narrative:    49 y.o. female with PMH of HIV, IDDM, HTN, h/o Hip Avascular necrosis, Hip surgery and Asthma who presented the ED with complaints of severe left hip pain after falling 3 days PTA. She reported that she has been taking increased amounts of Tylenol and Ibuprofen for her pain.She was evaluated in the ED and was found to have and elevated BUN/Cr of 30/4.07 from a Cr of 1.5.  Admitted with AKI. L hip, and wrist fracture. Underwent ORIF hardware removal of L hip, Closed Reduction of Displaced Colles Fracture on the Left and application of a Short arm Cast on 5/4.  Assessment/Plan:    Principal Problem:   ARF (acute renal failure) - presumed to be secondary to NSAID use, Bactrim, lasix, metformin - IVF have been provided and Cr still in 1.8 - 1.9 range but overall down since admission  - will repeat BMP in AM Active Problems:   Fever on 5/8 100.6 F - with WBC up from 11 --> 11.6, unclear etiology at this time - will ask for UA and CXR for now, obtain lactic acid, may need to initiate sepsis protocol if fever persists    Fall overnight 5/7 - 5/8 - no acute fractures identified - pt NAD this AM   Human immunodeficiency virus (HIV) disease - continue ARV regimen    Diabetes mellitus type 2 with complications of neuropathy  - A1C 8.3, continue lantus and SSI - metformin still on hold until renal function stabilizes, will notify PCP upon discharge  - continue pregabalin    Left hip pain from acute left intertrochanteric fracture  - H/o L hip arthoplasty, avascular hip necrosis. CT: Acute, comminuted left intertrochanteric fracture. - 5/4: s/p ORIF hardware removal of L hip, Closed Reduction of Displaced Colles Fx on left, application of short arm cast. - Dressing  changed on Left Hip and Left foot 5/6 - appreciate ortho team assistance    Anemia of chronic disease, HIV with some post op bleed - Hg trending over the past 72 hours: 10.2 --> 9.1 --> 8.9 - no signs of active bleeding, no indication for transfusion at this time  - cbc in AM   Hypokalemia - still on low end of normal, will supplement today again and repeat in AM   HTN - still on low end of normal and will therefore continue to hold antihypertensive regimen that pt is on at home (Atenolol)    HLD - continue statin    Morbid obesity - pt meets criteria with BMI 34.53 and underlying DM, HTN, HLD - Body mass index is 34.53 kg/(m^2).  DVT prophylaxis - Lovenox SQ  Code Status: Full.  Family Communication:  plan of care discussed with the patient Disposition Plan: barrier to d/c --> new fever 100.22F, needs CXR and UA to rule out possible infectious source. Possible d/c in 24 - 48 hours if no fevers and CXR?UA negative.   IV access:  Peripheral IV  Procedures and diagnostic studies:     ORIF hardware removal of L hip, Closed Reduction of Displaced Colles Fracture on the Left and application of a Short arm Cast   Ct Abdomen Pelvis Wo Contrast  05/03/2014   No acute findings in the abdomen or pelvis to account for the patient's symptoms.  Specifically, no signs of bowel obstruction are noted at this time. 2. Normal appendix. 3. Atherosclerosis. 4. Status post ORIF in the left hip where there is a healing intertrochanteric hip fracture. 5. Mild hepatic steatosis.   Dg Chest 2 View  05/03/2014    No active cardiopulmonary disease.     Dg Wrist 2 Views Left  05/05/2014    Fractures of the distal radial metaphysis and ulnar styloid process, which remain in near anatomic alignment.    Dg Wrist 2 Views Left  05/03/2014  Nondisplaced ulnar styloid fracture.  Angulated and minimally displaced distal LEFT radial metaphyseal fracture.     Ct Hip Left Wo Contrast  05/04/2014  Acute, comminuted left  intertrochanteric fracture. 2. Two cannulated screws transfixing a healed femoral neck fracture.    Dg Foot 2 Views Left  05/05/2014  No radiographic evidence of osteomyelitis.     Dg Hips Bilat With Pelvis 2v  05/02/2014  No acute fracture of the left or right hip. 2. Internal fixation of left hip fracture again noted.     Dg Hip Port Unilat With Pelvis 1v Left  05/05/2014  Status post fixation of a left intertrochanteric fracture without evidence of complication.  Status post removal of screws for fixation of remote healed subcapital fracture. No retained hardware.    Medical Consultants:  Ortho   Other Consultants:  PT  IAnti-Infectives:   None   Faye Ramsay, MD  [facility] Pager [phone]  If 7PM-7AM, please contact night-coverage [email or web address] Password [facility] 05/09/2014, 2:55 PM   LOS: 6 days   HPI/Subjective: Still with left hip pain and left arm pain, fell last night from bed.   Objective: Filed Vitals:   05/08/14 2244 05/09/14 0232 05/09/14 0625 05/09/14 1320  BP: 104/52 123/72 129/73 112/61  Pulse: 110 108 87 79  Temp: 98.5 F (36.9 C) 98.7 F (37.1 C) 99.7 F (37.6 C) 98.1 F (36.7 C)  TempSrc: Oral Oral Oral Oral  Resp: 20 20 18 20   Height:      Weight:      SpO2: 94% 95% 96% 98%    Intake/Output Summary (Last 24 hours) at 05/09/14 1455 Last data filed at 05/08/14 1847  Gross per 24 hour  Intake    840 ml  Output      0 ml  Net    840 ml    Exam:   General:  Pt is alert, follows commands appropriately, not in acute distress  Cardiovascular: Regular rate and rhythm, no rubs, no gallops  Respiratory: Clear to auscultation bilaterally, no wheezing, no crackles, no rhonchi  Abdomen: Soft, non tender, non distended, bowel sounds present, no guarding  Extremities:Tenderness in left hip area    Data Reviewed: Basic Metabolic Panel:  Recent Labs Lab 05/05/14 0533 05/06/14 0520 05/07/14 0606 05/08/14 0540 05/09/14 0550  NA 142 138 138 139 139   K 3.6 3.7 3.4* 3.5 3.5  CL 112* 108 113* 111 110  CO2 22 23 19* 20* 20*  GLUCOSE 107* 88 187* 219* 164*  BUN 28* 19 19 22* 23*  CREATININE 2.29* 1.82* 1.93* 1.87* 1.84*  CALCIUM 8.8* 8.4* 8.0* 8.4* 8.8*   Liver Function Tests:  Recent Labs Lab 05/04/14 0542 05/05/14 0533 05/06/14 0520  AST 18 15 17   ALT 14 14 11*  ALKPHOS 169* 157* 144*  BILITOT 0.5 0.3 0.7  PROT 6.4* 6.2* 6.2*  ALBUMIN 3.0* 2.8* 2.9*   CBC:  Recent Labs Lab 05/02/14 2318  05/04/14 0542 05/05/14  0533 05/05/14 1845 05/06/14 0520 05/08/14 0540 05/09/14 0550  WBC 11.3*  < > 8.7 9.4  --  9.5 11.0* 11.6*  NEUTROABS 8.9*  --   --   --   --  7.6  --   --   HGB 9.1*  < > 8.9* 9.0* 8.5* 10.2* 9.1* 8.9*  HCT 29.7*  < > 29.3* 29.5* 27.9* 33.0* 28.6* 28.3*  MCV 90.0  < > 91.6 91.0  --  90.4 91.7 90.7  PLT 249  < > 253 275  --  228 280 330  < > = values in this interval not displayed. CBG:  Recent Labs Lab 05/08/14 1210 05/08/14 1709 05/08/14 2138 05/09/14 0835 05/09/14 1317  GLUCAP 217* 167* 203* 148* 138*    Recent Results (from the past 240 hour(s))  Surgical pcr screen     Status: None   Collection Time: 05/04/14  9:16 PM  Result Value Ref Range Status   MRSA, PCR NEGATIVE NEGATIVE Final   Staphylococcus aureus NEGATIVE NEGATIVE Final  Anaerobic culture     Status: None (Preliminary result)   Collection Time: 05/05/14  4:46 PM  Result Value Ref Range Status   Specimen Description HIP LEFT  Final   Special Requests NONE  Final   Gram Stain   Final    NO WBC SEEN NO ORGANISMS SEEN Performed at [facility]    Culture   Final    NO ANAEROBES ISOLATED; CULTURE IN PROGRESS FOR 5 DAYS Performed at [facility]    Report Status PENDING  Incomplete  Body fluid culture     Status: None (Preliminary result)   Collection Time: 05/05/14  4:46 PM  Result Value Ref Range Status   Specimen Description HIP LEFT FLUID  Final   Special Requests NONE  Final   Gram Stain   Final     NO WBC SEEN NO ORGANISMS SEEN Performed at [facility]    Culture   Final    NO GROWTH 1 DAY Performed at [facility]    Report Status PENDING  Incomplete     Scheduled Meds: . ARIPiprazole  2 mg Oral Daily  . cholecalciferol  5,000 Units Oral Daily  . dextrose  25 mL Intravenous Once  . efavirenz  600 mg Oral QHS  . emtricitabine  200 mg Oral Q48H  . enoxaparin (LOVENOX) injection  40 mg Subcutaneous Q24H  . escitalopram  5 mg Oral Daily  . feeding supplement (GLUCERNA SHAKE)  237 mL Oral TID BM  . fenofibrate  160 mg Oral Daily  . insulin aspart  0-15 Units Subcutaneous TID WC  . insulin aspart  0-5 Units Subcutaneous QHS  . insulin glargine  35 Units Subcutaneous Daily  . pantoprazole  40 mg Oral q morning - 10a  . pregabalin  225 mg Oral BID  . rosuvastatin  20 mg Oral Daily  . tenofovir  300 mg Oral Q48H   Continuous Infusions:

## 2014-05-09 NOTE — Clinical Social Work Note (Signed)
CSW attempted to meet with pt at bedside to discuss SNF choice  Pt was asleep and RN's stated she has been sleeping along time  CSW called and spoke with pt's mother to provide the 3 SNF options  Pt's mother really wanted [facility] but CSW explained that they cannot accommodate and provided the 3 SNf's that can accept pt  Pt's mother stated she would think about these tonight and make a decision tomorrow  .Dede Query, LCSW [facility] Clinical Social Worker - Weekend Coverage cell #: [phone]

## 2014-05-09 NOTE — Progress Notes (Addendum)
Called into pt room by NT and found  Pt on the floor. Pt stated she was going the bathroom and slide to the floor. Assessed pt, no new injury noted , Vital signs WNL , pt denies pain , assisted pt back to bed,  surgery site intact . Notified the on Call Kathline Magic about the fall, new order given For x-ray of the left hip. Notify family and continue to monitor pt for the rest of the shift,

## 2014-05-09 NOTE — Progress Notes (Signed)
   Subjective: 4 Days Post-Op Procedure(s) (LRB): ARTHROPLASTY OPEN REDUCTION INTERNAL FIXATION LEFT HIP AND REMOVAL OF TWO CANNULATED SCREW (Left) CAST APPLICATION (FIBERGLASS) (Left) HARDWARE REMOVAL LEFT HIP (Left)  Pt resting comfortably in no acute distress Minimal pain to left hip Therapy progressing slowly Patient reports pain as mild.  Objective:   VITALS:   Filed Vitals:   05/09/14 0625  BP: 129/73  Pulse: 87  Temp: 99.7 F (37.6 C)  Resp: 18    Left hip incision healing well nv intact distally No rashes or edema  LABS  Recent Labs  05/08/14 0540 05/09/14 0550  HGB 9.1* 8.9*  HCT 28.6* 28.3*  WBC 11.0* 11.6*  PLT 280 330     Recent Labs  05/07/14 0606 05/08/14 0540 05/09/14 0550  NA 138 139 139  K 3.4* 3.5 3.5  BUN 19 22* 23*  CREATININE 1.93* 1.87* 1.84*  GLUCOSE 187* 219* 164*     Assessment/Plan: 4 Days Post-Op Procedure(s) (LRB): ARTHROPLASTY OPEN REDUCTION INTERNAL FIXATION LEFT HIP AND REMOVAL OF TWO CANNULATED SCREW (Left) CAST APPLICATION (FIBERGLASS) (Left) HARDWARE REMOVAL LEFT HIP (Left) Pt/OT D/c planning to snf Pain control Pulmonary toilet    Brad Corry Ihnen, MPAS, PA-C  05/09/2014, 7:11 AM

## 2014-05-09 NOTE — Progress Notes (Signed)
Sent message to physician reporting chest xray results are available.

## 2014-05-10 ENCOUNTER — Inpatient Hospital Stay (HOSPITAL_COMMUNITY): Admission: RE | Admit: 2014-05-10 | Payer: Medicare HMO | Source: Ambulatory Visit

## 2014-05-10 DIAGNOSIS — E876 Hypokalemia: Secondary | ICD-10-CM

## 2014-05-10 LAB — GLUCOSE, CAPILLARY
GLUCOSE-CAPILLARY: 203 mg/dL — AB (ref 70–99)
Glucose-Capillary: 151 mg/dL — ABNORMAL HIGH (ref 70–99)
Glucose-Capillary: 136 mg/dL — ABNORMAL HIGH (ref 70–99)
Glucose-Capillary: 231 mg/dL — ABNORMAL HIGH (ref 70–99)

## 2014-05-10 LAB — BASIC METABOLIC PANEL
Anion gap: 10 (ref 5–15)
BUN: 26 mg/dL — AB (ref 6–20)
CALCIUM: 8.6 mg/dL — AB (ref 8.9–10.3)
CO2: 22 mmol/L (ref 22–32)
Chloride: 106 mmol/L (ref 101–111)
Creatinine, Ser: 1.94 mg/dL — ABNORMAL HIGH (ref 0.44–1.00)
GFR calc Af Amer: 34 mL/min — ABNORMAL LOW (ref >60)
GFR, EST NON AFRICAN AMERICAN: 29 mL/min — AB (ref >60)
Glucose, Bld: 151 mg/dL — ABNORMAL HIGH (ref 70–99)
Potassium: 3.2 mmol/L — ABNORMAL LOW (ref 3.5–5.1)
Sodium: 138 mmol/L (ref 135–145)

## 2014-05-10 LAB — ANAEROBIC CULTURE: GRAM STAIN: NONE SEEN

## 2014-05-10 LAB — CBC
HCT: 29.5 % — ABNORMAL LOW (ref 36.0–46.0)
HEMOGLOBIN: 9.1 g/dL — AB (ref 12.0–15.0)
MCH: 27.7 pg (ref 26.0–34.0)
MCHC: 30.8 g/dL (ref 30.0–36.0)
MCV: 89.7 fL (ref 78.0–100.0)
Platelets: 342 10*3/uL (ref 150–400)
RBC: 3.29 MIL/uL — AB (ref 3.87–5.11)
RDW: 18.3 % — ABNORMAL HIGH (ref 11.5–15.5)
WBC: 8.9 10*3/uL (ref 4.0–10.5)

## 2014-05-10 MED ORDER — POTASSIUM CHLORIDE CRYS ER 20 MEQ PO TBCR
40.0000 meq | EXTENDED_RELEASE_TABLET | Freq: Once | ORAL | Status: AC
  Administered 2014-05-10: 40 meq via ORAL
  Filled 2014-05-10: qty 2

## 2014-05-10 MED ORDER — ZOLPIDEM TARTRATE 5 MG PO TABS
5.0000 mg | ORAL_TABLET | Freq: Once | ORAL | Status: AC
  Administered 2014-05-10: 5 mg via ORAL
  Filled 2014-05-10: qty 1

## 2014-05-10 NOTE — Progress Notes (Signed)
Physical Therapy Treatment Patient Details Name: MIREILY WICHERS MRN: [id number] DOB: May 01, 1965 Today's Date: 05/10/2014    History of Present Illness Pt fell 3 days prior to admission and had L hip pain.  s/p ORIF L hip with removal of 2 previous cannulated screws and closed reduction/casting to L wrist.  PMH:  HIV, IDDM, HTN    PT Comments    Assisted pt OOB to [facility] for a BM.  Assisted with hygiene while she briefly stood. Switched BSC and recliner out from behind then assisted to recliner.  Hoyer pad in place for nursing to use lift to assist pt back to bed.   Follow Up Recommendations  SNF     Equipment Recommendations       Recommendations for Other Services       Precautions / Restrictions Precautions Precautions: Fall Precaution Comments: Keep legs together when getting OOB.  OK to use maximove back to bed per Dr Gladstone Lighter Restrictions Weight Bearing Restrictions: Yes LLE Weight Bearing: Non weight bearing Other Position/Activity Restrictions: NWB L LE and L hand - pt may WB on L elbow - ie using platform walker    Mobility  Bed Mobility Overal bed mobility: Needs Assistance;+2 for physical assistance Bed Mobility: Supine to Sit     Supine to sit: Max assist;+2 for physical assistance     General bed mobility comments: increased assist needed due to increased pain score  Transfers Overall transfer level: Needs assistance Equipment used: Left platform walker Transfers: Sit to/from Stand Sit to Stand: From elevated surface;+2 physical assistance;Mod assist        Lateral/Scoot Transfers: +2 physical assistance;Max assist General transfer comment: assisted from elevated bed to [facility] 1/4 pivot turn then assisted off BSC sit to stand with 50% VC's on proper R UE hand placement.  Assisted with hygiene as pt stood briefly (approx 45 min) then switched BSC and recliner behind pt as she stood.  Assisted to recliner via stand to sit with 50% VC's on proper R UE hand  placement and 75% VC's to avoid hip flex > 90 degrees.   Ambulation/Gait             General Gait Details: Transfers only   Financial trader Rankin (Stroke Patients Only)       Balance                                    Cognition Arousal/Alertness: Awake/alert Behavior During Therapy: WFL for tasks assessed/performed Overall Cognitive Status: Within Functional Limits for tasks assessed                      Exercises      General Comments        Pertinent Vitals/Pain Pain Assessment: 0-10 Pain Score: 10-Worst pain ever Pain Location: L hip when getting to EOB/RN called for meds Pain Descriptors / Indicators: Crushing Pain Intervention(s): Monitored during session;Repositioned;Ice applied;Patient requesting pain meds-RN notified    Home Living                      Prior Function            PT Goals (current goals can now be found in the care plan section) Progress towards PT goals: Progressing toward goals    Frequency  Min 3X/week    PT Plan      Co-evaluation             End of Session Equipment Utilized During Treatment: Gait belt Activity Tolerance: Patient tolerated treatment well Patient left: in chair;with call bell/phone within reach     Time: [id number] PT Time Calculation (min) (ACUTE ONLY): 25 min  Charges:  $Therapeutic Activity: 23-37 mins                    G Codes:      Rica Koyanagi  PTA [facility]  Acute  Rehab Pager      [phone]

## 2014-05-10 NOTE — Progress Notes (Signed)
   Subjective: 5 Days Post-Op Procedure(s) (LRB): ARTHROPLASTY OPEN REDUCTION INTERNAL FIXATION LEFT HIP AND REMOVAL OF TWO CANNULATED SCREW (Left) CAST APPLICATION (FIBERGLASS) (Left) HARDWARE REMOVAL LEFT HIP (Left) Patient reports pain as mild.   Patient seen in rounds for Dr. Gladstone Lighter. Patient reports that she is "sore". No SOB or chest pain. Reports that she is voiding well and has had BM.  Plan is to go Skilled nursing facility after hospital stay.  Objective: Vital signs in last 24 hours: Temp:  [98 F (36.7 C)-98.4 F (36.9 C)] 98.4 F (36.9 C) (05/09 0510) Pulse Rate:  [79-95] 91 (05/09 0510) Resp:  [20] 20 (05/09 0510) BP: (112-140)/(61-108) 128/63 mmHg (05/09 0510) SpO2:  [98 %-100 %] 100 % (05/09 0510)  Intake/Output from previous day:  Intake/Output Summary (Last 24 hours) at 05/10/14 0750 Last data filed at 05/10/14 0300  Gross per 24 hour  Intake      0 ml  Output    801 ml  Net   -801 ml     Labs:  Recent Labs  05/08/14 0540 05/09/14 0550  HGB 9.1* 8.9*    Recent Labs  05/08/14 0540 05/09/14 0550  WBC 11.0* 11.6*  RBC 3.12* 3.12*  HCT 28.6* 28.3*  PLT 280 330    Recent Labs  05/08/14 0540 05/09/14 0550  NA 139 139  K 3.5 3.5  CL 111 110  CO2 20* 20*  BUN 22* 23*  CREATININE 1.87* 1.84*  GLUCOSE 219* 164*  CALCIUM 8.4* 8.8*    EXAM General - Patient is Alert and Oriented Extremity - Neurologically intact Intact pulses distally Dorsiflexion/Plantar flexion intact Compartment soft Dressing/Incision - clean, dry, no drainage Motor Function - intact, moving foot and toes well on exam.   Past Medical History  Diagnosis Date  . Anxiety   . Depression   . Hypertension   . HIV infection 2000  . Asthma     HOSPITALIZED WITH EXCERBATION OF ASTHMA - AND BRONCHITIS AND THE FLU DEC 2013  . Shortness of breath     ALLERGIES ARE "ACTING UP"  . Diabetes mellitus     ON INSULIN AND ORAL MEDICATIONS  . GERD (gastroesophageal reflux  disease)   . Neuropathy     NEUROPATHY HANDS AND FEET  . Arthritis     LEFT HIP  . Pain     SEVERE PAIN LEFT HIP - HX OF LEFT HIP PINNING JULY 2013    Assessment/Plan: 5 Days Post-Op Procedure(s) (LRB): ARTHROPLASTY OPEN REDUCTION INTERNAL FIXATION LEFT HIP AND REMOVAL OF TWO CANNULATED SCREW (Left) CAST APPLICATION (FIBERGLASS) (Left) HARDWARE REMOVAL LEFT HIP (Left) Principal Problem:   ARF (acute renal failure) Active Problems:   Human immunodeficiency virus (HIV) disease   Diabetes mellitus type 2 with complications, uncontrolled   Left hip pain   Asthma   Hip pain  Estimated body mass index is 34.53 kg/(m^2) as calculated from the following:   Height as of this encounter: 5\' 9"  (1.753 m).   Weight as of this encounter: 106.1 kg (233 lb 14.5 oz). Advance diet Discharge to SNF when medically stable  DVT Prophylaxis - Lovenox NWB left LE and UE  From ortho perspective she is doing fair. Progressing well. Will continue NWB. Lovenox for an additional 10 days then transition to aspirin. Follow up in office in 10 days. Discharge instructions given. Family to decide on placement today.   Ardeen Jourdain, PA-C Orthopaedic Surgery 05/10/2014, 7:50 AM

## 2014-05-10 NOTE — Progress Notes (Signed)
Ms Fetsch  was found in her room with her purse in her bed and pills spill everywhere, pt had open home meds pill  bottles in her bed, states she did  Not take any  of these meds.  Counted the pills along with Nursing supervisor Sarah and send it down to pharmacy as per protocol. Notify the on call Derrill Kay,  NP about what happen

## 2014-05-10 NOTE — Progress Notes (Signed)
Patient ID: [name], female   DOB: Oct 16, 1965, 49 y.o.   MRN: [id number]  [facility] PROGRESS NOTE  DARIYAN THAMMAVONG [id number] DOB: 04/04/1965 DOA: 05/02/2014 PCP: Aura Dials, PA-C   Brief narrative:  According to prior PN   49 y.o. female with PMH of HIV, IDDM, HTN, h/o Hip Avascular necrosis, Hip surgery and Asthma who presented the ED with complaints of severe left hip pain after falling 3 days PTA. She reported that she has been taking increased amounts of Tylenol and Ibuprofen for her pain.She was evaluated in the ED and was found to have and elevated BUN/Cr of 30/4.07 from a Cr of 1.5.  Admitted with AKI. L hip, and wrist fracture. Underwent ORIF hardware removal of L hip, Closed Reduction of Displaced Colles Fracture on the Left and application of a Short arm Cast on 5/4.  Assessment/Plan:    Principal Problem:   ARF (acute renal failure) - agree that this could have been secondary to NSAID use, Bactrim, lasix, metformin - IVF have been provided and Cr still in 1.8 - 1.9 , if persistently elevated will have to discontinue metformin on discharge - will repeat BMP in AM  Hypokalemia - Obtain BMP next a.m. - Replace orally  Active Problems:   Fever on 5/8 100.6 F - WBC trending down and within normal limits currently. - Chest S tray reporting bilateral rib fractures complicating factors - UA is not suspicious for UTI    Human immunodeficiency virus (HIV) disease - continue ARV regimen     Diabetes mellitus type 2 with complications of neuropathy  - A1C 8.3, continue lantus and SSI - Given elevated serum creatinine we'll continue to hold metformin - continue pregabalin     Left hip pain from acute left intertrochanteric fracture  - H/o L hip arthoplasty, avascular hip necrosis. CT: Acute, comminuted left intertrochanteric fracture. - 5/4: s/p ORIF hardware removal of L hip, Closed Reduction of Displaced Colles Fx on left, application of short arm  cast. - appreciate ortho team assistance  - continue supportive therapy    Anemia of chronic disease, HIV with some post op bleed - Hemoglobin stable at 9.1 - no signs of active bleeding, no indication for transfusion at this time  - cbc in AM    Hypokalemia - K-Dur 1 and then reassess next a.m.    HTN - Will continue Atenolol    HLD - continue statin     Morbid obesity - pt meets criteria with BMI 34.53 and underlying DM, HTN, HLD - Body mass index is 34.53 kg/(m^2).  DVT prophylaxis - Lovenox SQ  Code Status: Full.  Family Communication:  plan of care discussed with the patient Disposition Plan: barrier to d/c --> should patient remain afebrile within the next 24 hours will consider discharging  IV access:  Peripheral IV  Procedures and diagnostic studies:     ORIF hardware removal of L hip, Closed Reduction of Displaced Colles Fracture on the Left and application of a Short arm Cast   Ct Abdomen Pelvis Wo Contrast  05/03/2014   No acute findings in the abdomen or pelvis to account for the patient's symptoms. Specifically, no signs of bowel obstruction are noted at this time. 2. Normal appendix. 3. Atherosclerosis. 4. Status post ORIF in the left hip where there is a healing intertrochanteric hip fracture. 5. Mild hepatic steatosis.   Dg Chest 2 View  05/03/2014    No active cardiopulmonary disease.     Dg  Wrist 2 Views Left  05/05/2014    Fractures of the distal radial metaphysis and ulnar styloid process, which remain in near anatomic alignment.    Dg Wrist 2 Views Left  05/03/2014  Nondisplaced ulnar styloid fracture.  Angulated and minimally displaced distal LEFT radial metaphyseal fracture.     Ct Hip Left Wo Contrast  05/04/2014  Acute, comminuted left intertrochanteric fracture. 2. Two cannulated screws transfixing a healed femoral neck fracture.    Dg Foot 2 Views Left  05/05/2014  No radiographic evidence of osteomyelitis.     Dg Hips Bilat With Pelvis 2v  05/02/2014  No  acute fracture of the left or right hip. 2. Internal fixation of left hip fracture again noted.     Dg Hip Port Unilat With Pelvis 1v Left  05/05/2014  Status post fixation of a left intertrochanteric fracture without evidence of complication.  Status post removal of screws for fixation of remote healed subcapital fracture. No retained hardware.    Medical Consultants:  Ortho   Other Consultants:  PT  IAnti-Infectives:   None   Velvet Bathe, MD  [facility] Pager [phone]  If 7PM-7AM, please contact night-coverage [email or web address] Password [email or web address] 05/10/2014, 5:59 PM   LOS: 7 days   HPI/Subjective: Patient has no new complaints  Objective: Filed Vitals:   05/09/14 1320 05/09/14 2111 05/10/14 0007 05/10/14 0510  BP: 112/61 134/108 140/71 128/63  Pulse: 79 95 92 91  Temp: 98.1 F (36.7 C) 98 F (36.7 C)  98.4 F (36.9 C)  TempSrc: Oral Oral  Oral  Resp: 20 20  20   Height:      Weight:      SpO2: 98% 99%  100%    Intake/Output Summary (Last 24 hours) at 05/10/14 1759 Last data filed at 05/10/14 0300  Gross per 24 hour  Intake      0 ml  Output    801 ml  Net   -801 ml    Exam:   General:  Pt is alert, follows commands appropriately, not in acute distress  Cardiovascular: Regular rate and rhythm, no rubs, no gallops  Respiratory: No increased work of breathing, no wheezes, no rhonchi  Abdomen: Soft, non tender, non distended, no guarding  Extremities:Tenderness in left hip area    Data Reviewed: Basic Metabolic Panel:  Recent Labs Lab 05/06/14 0520 05/07/14 0606 05/08/14 0540 05/09/14 0550 05/10/14 0744  NA 138 138 139 139 138  K 3.7 3.4* 3.5 3.5 3.2*  CL 108 113* 111 110 106  CO2 23 19* 20* 20* 22  GLUCOSE 88 187* 219* 164* 151*  BUN 19 19 22* 23* 26*  CREATININE 1.82* 1.93* 1.87* 1.84* 1.94*  CALCIUM 8.4* 8.0* 8.4* 8.8* 8.6*   Liver Function Tests:  Recent Labs Lab 05/04/14 0542 05/05/14 0533 05/06/14 0520  AST 18 15 17   ALT 14 14 11*  ALKPHOS  169* 157* 144*  BILITOT 0.5 0.3 0.7  PROT 6.4* 6.2* 6.2*  ALBUMIN 3.0* 2.8* 2.9*   CBC:  Recent Labs Lab 05/05/14 0533 05/05/14 1845 05/06/14 0520 05/08/14 0540 05/09/14 0550 05/10/14 0744  WBC 9.4  --  9.5 11.0* 11.6* 8.9  NEUTROABS  --   --  7.6  --   --   --   HGB 9.0* 8.5* 10.2* 9.1* 8.9* 9.1*  HCT 29.5* 27.9* 33.0* 28.6* 28.3* 29.5*  MCV 91.0  --  90.4 91.7 90.7 89.7  PLT 275  --  228 280 330 342   CBG:  Recent Labs Lab 05/09/14 1624 05/09/14 2254 05/10/14 0735 05/10/14 1201 05/10/14 1706  GLUCAP 204* 167* 151* 136* 231*    Recent Results (from the past 240 hour(s))  Surgical pcr screen     Status: None   Collection Time: 05/04/14  9:16 PM  Result Value Ref Range Status   MRSA, PCR NEGATIVE NEGATIVE Final   Staphylococcus aureus NEGATIVE NEGATIVE Final  Anaerobic culture     Status: None (Preliminary result)   Collection Time: 05/05/14  4:46 PM  Result Value Ref Range Status   Specimen Description HIP LEFT  Final   Special Requests NONE  Final   Gram Stain   Final    NO WBC SEEN NO ORGANISMS SEEN Performed at [facility]    Culture   Final    NO ANAEROBES ISOLATED; CULTURE IN PROGRESS FOR 5 DAYS Performed at [facility]    Report Status PENDING  Incomplete  Body fluid culture     Status: None (Preliminary result)   Collection Time: 05/05/14  4:46 PM  Result Value Ref Range Status   Specimen Description HIP LEFT FLUID  Final   Special Requests NONE  Final   Gram Stain   Final    NO WBC SEEN NO ORGANISMS SEEN Performed at [facility]    Culture   Final    NO GROWTH 1 DAY Performed at [facility]    Report Status PENDING  Incomplete     Scheduled Meds: . ARIPiprazole  2 mg Oral Daily  . cholecalciferol  5,000 Units Oral Daily  . efavirenz  600 mg Oral QHS  . emtricitabine  200 mg Oral Q48H  . enoxaparin (LOVENOX) injection  40 mg Subcutaneous Q24H  . escitalopram  5 mg Oral Daily  . feeding supplement  (GLUCERNA SHAKE)  237 mL Oral TID BM  . fenofibrate  160 mg Oral Daily  . insulin aspart  0-15 Units Subcutaneous TID WC  . insulin aspart  0-5 Units Subcutaneous QHS  . insulin glargine  35 Units Subcutaneous Daily  . pantoprazole  40 mg Oral q morning - 10a  . pregabalin  100 mg Oral BID  . rosuvastatin  20 mg Oral Daily  . tenofovir  300 mg Oral Q48H   Continuous Infusions:

## 2014-05-11 LAB — GLUCOSE, CAPILLARY
GLUCOSE-CAPILLARY: 152 mg/dL — AB (ref 70–99)
GLUCOSE-CAPILLARY: 185 mg/dL — AB (ref 70–99)
GLUCOSE-CAPILLARY: 146 mg/dL — AB (ref 70–99)
Glucose-Capillary: 133 mg/dL — ABNORMAL HIGH (ref 70–99)

## 2014-05-11 LAB — BASIC METABOLIC PANEL
Anion gap: 10 (ref 5–15)
BUN: 28 mg/dL — ABNORMAL HIGH (ref 6–20)
CHLORIDE: 106 mmol/L (ref 101–111)
CO2: 21 mmol/L — AB (ref 22–32)
Calcium: 8.5 mg/dL — ABNORMAL LOW (ref 8.9–10.3)
Creatinine, Ser: 1.8 mg/dL — ABNORMAL HIGH (ref 0.44–1.00)
GFR calc Af Amer: 37 mL/min — ABNORMAL LOW (ref >60)
GFR calc non Af Amer: 32 mL/min — ABNORMAL LOW (ref >60)
Glucose, Bld: 184 mg/dL — ABNORMAL HIGH (ref 70–99)
POTASSIUM: 3.7 mmol/L (ref 3.5–5.1)
Sodium: 137 mmol/L (ref 135–145)

## 2014-05-11 MED ORDER — INSULIN GLARGINE 100 UNIT/ML ~~LOC~~ SOLN: 35.0000 [IU] | Freq: Every day | SUBCUTANEOUS | Status: DC

## 2014-05-11 MED ORDER — ZOLPIDEM TARTRATE 5 MG PO TABS
5.0000 mg | ORAL_TABLET | Freq: Every evening | ORAL | Status: DC | PRN
  Administered 2014-05-12: 5 mg via ORAL
  Filled 2014-05-11: qty 1

## 2014-05-11 MED ORDER — GLUCERNA SHAKE PO LIQD: 237.0000 mL | Freq: Three times a day (TID) | ORAL | Status: DC

## 2014-05-11 NOTE — Progress Notes (Signed)
Barrier to discharge is [facility] through [facility]; will be available tomorrow. Pt has bed at [facility].   CSW spoke to Pt's mother and made her aware. She is agreeable to plan.  Peri Maris, [place] 05/11/2014 5:33 PM [phone]

## 2014-05-11 NOTE — Progress Notes (Signed)
Occupational Therapy Treatment Patient Details Name: CHUNDRA LOHN MRN: [id number] DOB: 03-26-1965 Today's Date: 05/11/2014    History of present illness Pt fell 3 days prior to admission and had L hip pain.  s/p ORIF L hip with removal of 2 previous cannulated screws and closed reduction/casting to L wrist.  PMH:  HIV, IDDM, HTN   OT comments  Pt was able to perform SPT to 3:1 and back to bed with mod A x 2 and moderate cueing for safety, NWB and technique.  Moved bed to allow pt to sit in the proper position after she stood from 3:1 commode  Follow Up Recommendations  SNF    Equipment Recommendations  None recommended by OT    Recommendations for Other Services      Precautions / Restrictions Precautions Precautions: Fall Precaution Comments: Keep legs together when getting OOB.  OK to use maximove back to bed per Dr Gladstone Lighter Restrictions LUE Weight Bearing: Non weight bearing LLE Weight Bearing: Non weight bearing       Mobility Bed Mobility       Sidelying to sit: Mod assist;+2 for safety/equipment       General bed mobility comments: increased time and cues for technique  Transfers   Equipment used: Left platform walker Transfers: Sit to/from Stand;Stand Pivot Transfers Sit to Stand: From elevated surface;+2 physical assistance;Mod assist Stand pivot transfers: Mod assist;+2 physical assistance       General transfer comment: SPT to [facility] and back to bed with mod A x 2.  Cues for safety, managing LUE on trough of platform and keeping LLE forward for NWB.  Initially, pt was reaching for several locations other than handle with RUE.      Balance                                   ADL                           Toilet Transfer: Moderate assistance;+2 for physical assistance;Regular Toilet;RW             General ADL Comments: transferred to 3:1 commode and back to bed.  Pt unable to void.  Extra time and cues given for NWB.  Pt  thought MD said she can start to weight bear through LUE.  Checked chart and reinforced that she is NWB.  Pt tearful, states she has been having nightmares.        Vision                     Perception     Praxis      Cognition   Behavior During Therapy: WFL for tasks assessed/performed Overall Cognitive Status: Within Functional Limits for tasks assessed                       Extremity/Trunk Assessment               Exercises     Shoulder Instructions       General Comments      Pertinent Vitals/ Pain       Pain Assessment: Faces Faces Pain Scale: Hurts whole lot Pain Location: L hip Pain Descriptors / Indicators: Aching Pain Intervention(s): Limited activity within patient's tolerance;Monitored during session;Premedicated before session;Repositioned  Home Living  Prior Functioning/Environment              Frequency Min 2X/week     Progress Toward Goals  OT Goals(current goals can now be found in the care plan section)  Progress towards OT goals: Progressing toward goals     Plan Discharge plan remains appropriate    Co-evaluation                 End of Session     Activity Tolerance Patient tolerated treatment well   Patient Left in bed;with call bell/phone within reach   Nurse Communication          Time: [id number] OT Time Calculation (min): 36 min  Charges: OT General Charges $OT Visit: 1 Procedure OT Treatments $Self Care/Home Management : 23-37 mins  Kaily Wragg 05/11/2014, 3:56 PM  Lesle Chris, OTR/L [phone] 05/11/2014

## 2014-05-11 NOTE — Progress Notes (Signed)
Subjective: 6 Days Post-Op Procedure(s) (LRB): ARTHROPLASTY OPEN REDUCTION INTERNAL FIXATION LEFT HIP AND REMOVAL OF TWO CANNULATED SCREW (Left) CAST APPLICATION (FIBERGLASS) (Left) HARDWARE REMOVAL LEFT HIP (Left) Patient reports pain as 2 on 0-10 scale. Hip is much better. Circulation in Left hand is fine. Minimal Drainage from left foot,but no erythema.. Need daily dressing changes with Betadine. Cultures from Left Hip at the time of surgery showed No Growth.   Objective: Vital signs in last 24 hours: Temp:  [98.1 F (36.7 C)-98.2 F (36.8 C)] 98.2 F (36.8 C) (05/10 0604) Pulse Rate:  [96-98] 96 (05/10 0604) Resp:  [17-19] 17 (05/10 0604) BP: (95-129)/(62-73) 129/73 mmHg (05/10 0604) SpO2:  [95 %-97 %] 97 % (05/10 0604) Weight:  [104.6 kg (230 lb 9.6 oz)] 104.6 kg (230 lb 9.6 oz) (05/09 2129)  Intake/Output from previous day: 05/09 0701 - 05/10 0700 In: 480 [P.O.:480] Out: 1200 [Urine:1200] Intake/Output this shift:     Recent Labs  05/09/14 0550 05/10/14 0744  HGB 8.9* 9.1*    Recent Labs  05/09/14 0550 05/10/14 0744  WBC 11.6* 8.9  RBC 3.12* 3.29*  HCT 28.3* 29.5*  PLT 330 342    Recent Labs  05/10/14 0744 05/11/14 0520  NA 138 137  K 3.2* 3.7  CL 106 106  CO2 22 21*  BUN 26* 28*  CREATININE 1.94* 1.80*  GLUCOSE 151* 184*  CALCIUM 8.6* 8.5*   No results for input(s): LABPT, INR in the last 72 hours.  No cellulitis present Compartment soft  Assessment/Plan: 6 Days Post-Op Procedure(s) (LRB): ARTHROPLASTY OPEN REDUCTION INTERNAL FIXATION LEFT HIP AND REMOVAL OF TWO CANNULATED SCREW (Left) CAST APPLICATION (FIBERGLASS) (Left) HARDWARE REMOVAL LEFT HIP (Left) Discharge to SNF. Will follow in office. Ortho orders written.  Edynn Gillock A 05/11/2014, 7:23 AM

## 2014-05-11 NOTE — Discharge Summary (Signed)
Physician Discharge Summary  [name] [id number] DOB: 09-17-1965 DOA: 05/02/2014  PCP: Aura Dials, PA-C  Admit date: 05/02/2014 Discharge date: 05/11/2014  Time spent: > 35 minutes  Recommendations for Outpatient Follow-up:  1. Please monitor serum creatinine within the next week 2. Decide when to continue lasix if indicated 3. Continue physical therapy while at facility 4. Will continue Lantus on discharge, metformin and byetta will be held due to elevated serum creatinine. As such have patient's primary care physician monitor patient's blood sugars and continue to adjust her hypoglycemic regimen.  Discharge Diagnoses:  Principal Problem:   ARF (acute renal failure) Active Problems:   Human immunodeficiency virus (HIV) disease   Diabetes mellitus type 2 with complications, uncontrolled   Left hip pain   Asthma   Hip pain   Discharge Condition: stable  Diet recommendation: heart healthy  Filed Weights   05/03/14 0316 05/10/14 2129  Weight: 106.1 kg (233 lb 14.5 oz) 104.6 kg (230 lb 9.6 oz)    History of present illness:  From original HPI: 49 y.o. female with a history of HIV Disease, IDDM, HTN and Asthma who presents the ED with complaints of severe left hip pain after falling 3 days ago. She reports that she has been taking increased amounts of Tylenol and Ibuprofen for her pain. She was evaluated in the ED and was found to have and elevated BUN/Cr of 30/4.07 from a Cratinine of 1.5. Imaging of her Left Hip was negative for fracture.   Upon further evaluation patient was found to have Colles Fracture and an Avascular Necrosis of her left hip secondary to a [name] Femoral Neck fx.  Hospital Course:  Colles Fracture and Avascular Necrosis of left hip: - Ortho consulted per their note: ARTHROPLASTY OPEN REDUCTION INTERNAL FIXATION LEFT HIP AND REMOVAL OF TWO CANNULATED SCREW (Left) CAST APPLICATION (FIBERGLASS) (Left) HARDWARE REMOVAL LEFT HIP  (Left) Discharge to SNF. Will follow in office.  ARF - Elevated S creatinine - as such will hold Lasix on discharge - Has been at 1.8 not sure if this is new baseline - discontinued metformin  HIV - continue home regimen ARV therapy  DM - d/c on Lantus 35 units sq daily  Procedures:  Please refer to EMR and Dr. Gladstone Lighter notes  Consultations:  Dr. Gladstone Lighter with ortho  Discharge Exam: Filed Vitals:   05/11/14 0604  BP: 129/73  Pulse: 96  Temp: 98.2 F (36.8 C)  Resp: 17    General: pt in nad, alert and awake Cardiovascular: rrr, no mrg Respiratory: cta bl, no wheezes  Discharge Instructions   Discharge Instructions    Call MD for:  difficulty breathing, headache or visual disturbances    Complete by:  As directed      Call MD for:  temperature >100.4    Complete by:  As directed      Diet - low sodium heart healthy    Complete by:  As directed      Increase activity slowly    Complete by:  As directed      Non weight bearing    Complete by:  As directed   Laterality:  left  Extremity:  Both          Current Discharge Medication List    START taking these medications   Details  aspirin EC 325 MG tablet Take 1 tablet (325 mg total) by mouth 2 (two) times daily. Qty: 30 tablet, Refills: 0    enoxaparin (LOVENOX) 40 MG/0.4ML  injection Inject 0.4 mLs (40 mg total) into the skin daily. Qty: 10 Syringe, Refills: 0    feeding supplement, GLUCERNA SHAKE, (GLUCERNA SHAKE) LIQD Take 237 mLs by mouth 3 (three) times daily between meals. Refills: 0    insulin glargine (LANTUS) 100 UNIT/ML injection Inject 0.35 mLs (35 Units total) into the skin daily. Qty: 10 mL, Refills: 11    oxyCODONE (OXY IR/ROXICODONE) 5 MG immediate release tablet Take 1-3 tablets (5-15 mg total) by mouth every 4 (four) hours as needed for moderate pain or severe pain. Qty: 60 tablet, Refills: 0      CONTINUE these medications which have NOT CHANGED   Details  albuterol (PROVENTIL)  (2.5 MG/3ML) 0.083% nebulizer solution Take 2.5 mg by nebulization every 6 (six) hours as needed for wheezing. For wheezing or shortness of breath    ARIPiprazole (ABILIFY) 2 MG tablet Take 2 mg by mouth daily. Mental health    atenolol (TENORMIN) 25 MG tablet Take 25 mg by mouth every morning.     ATRIPLA 600-200-300 MG per tablet TAKE 1 TABLET BY MOUTH EVERY NIGHT AT BEDTIME Qty: 30 tablet, Refills: 0    budesonide-formoterol (SYMBICORT) 80-4.5 MCG/ACT inhaler Inhale 2 puffs into the lungs 2 (two) times daily.    Cholecalciferol (VITAMIN D3) 5000 UNITS TABS Take 1 tablet by mouth daily.    escitalopram (LEXAPRO) 20 MG tablet Take 20 mg by mouth daily.    fenofibrate (TRICOR) 145 MG tablet Take 1 tablet (145 mg total) by mouth daily. Qty: 90 tablet, Refills: 3   Associated Diagnoses: Other and unspecified hyperlipidemia; Encounter for long-term (current) use of other medications; Need for prophylactic vaccination and inoculation against influenza; Hip fracture, left, sequela; Type II or unspecified type diabetes mellitus without mention of complication, not stated as uncontrolled; Human immunodeficiency virus (HIV) disease; Depressive disorder, not elsewhere classified; Screening examination for venereal disease    Multiple Vitamins-Minerals (MULTIVITAMIN WITH MINERALS) tablet Take 1 tablet by mouth daily.    pantoprazole (PROTONIX) 40 MG tablet Take 40 mg by mouth every morning.     pregabalin (LYRICA) 225 MG capsule Take 225 mg by mouth 2 (two) times daily.    rosuvastatin (CRESTOR) 20 MG tablet Take 20 mg by mouth daily.    vitamin E (VITAMIN E) 1000 UNIT capsule Take 1,000 Units by mouth daily.     dextromethorphan-guaiFENesin (MUCINEX DM) 30-600 MG per 12 hr tablet Take 1 tablet by mouth 2 (two) times daily as needed (congestion).      STOP taking these medications     cyclobenzaprine (FLEXERIL) 10 MG tablet      eszopiclone (LUNESTA) 1 MG TABS tablet      Exenatide  (BYETTA 10 MCG PEN [place])      furosemide (LASIX) 20 MG tablet      insulin aspart (NOVOLOG) 100 UNIT/ML injection      Insulin Glargine 300 UNIT/ML SOPN      metFORMIN (GLUCOPHAGE) 1000 MG tablet      methylphenidate (RITALIN) 20 MG tablet      sulfamethoxazole-trimethoprim (BACTRIM DS,SEPTRA DS) 800-160 MG per tablet      traMADol (ULTRAM) 50 MG tablet      diphenoxylate-atropine (LOMOTIL) 2.5-0.025 MG per tablet      HYDROcodone-acetaminophen (NORCO) 10-325 MG per tablet      itraconazole (SPORANOX) 10 MG/ML solution      potassium chloride (K-DUR) 10 MEQ tablet        Allergies  Allergen Reactions  . Cortizone-10 [Hydrocortisone] Rash  Per patient   Follow-up Information    Follow up with GIOFFRE,RONALD A, MD. Schedule an appointment as soon as possible for a visit in 10 days.   Specialty:  Orthopedic Surgery   Contact information:   [address] [place] [address] [place] [address] [phone]        The results of significant diagnostics from this hospitalization (including imaging, microbiology, ancillary and laboratory) are listed below for reference.    Significant Diagnostic Studies: Ct Abdomen Pelvis Wo Contrast  05/03/2014   CLINICAL DATA:  49 year old female with abdominal pain and distention. Evaluate for potential bowel obstruction.  EXAM: CT ABDOMEN AND PELVIS WITHOUT CONTRAST  TECHNIQUE: Multidetector CT imaging of the abdomen and pelvis was performed following the standard protocol without IV contrast.  COMPARISON:  No priors.  FINDINGS: Lower chest:  Unremarkable.  Hepatobiliary: Mild diffuse low attenuation throughout the hepatic parenchyma, compatible with mild hepatic steatosis. No definite cystic or solid hepatic lesions are identified on today's noncontrast CT examination. The unenhanced appearance of the gallbladder is normal.  Pancreas: Unremarkable.  Spleen: Unremarkable.  Adrenals/Urinary Tract: Bilateral adrenal glands are normal in  appearance. Bilateral kidneys are normal in appearance. Incidental note is made of duplication of the right renal collecting system and the proximal half of the right ureter (normal anatomical variant). The unenhanced appearance of the urinary bladder is normal.  Stomach/Bowel: The unenhanced appearance of the stomach is normal. No pathologic dilatation of small bowel or colon to suggest bowel obstruction at this time. Normal appendix.  Vascular/Lymphatic: Atherosclerosis throughout the abdominal and pelvic vasculature, without definite aneurysm. No lymphadenopathy noted in the abdomen or pelvis on today's noncontrast CT examination.  Reproductive: Unremarkable.  Other: No significant volume of ascites.  No pneumoperitoneum.  Musculoskeletal: Postoperative changes of ORIF for again noted in the left femoral neck where there are 2 cannulated fixation screws traversing an intertrochanteric hip fracture. The fracture line is still well visualized. There are no aggressive appearing lytic or blastic lesions noted in the visualized portions of the skeleton.  IMPRESSION: 1. No acute findings in the abdomen or pelvis to account for the patient's symptoms. Specifically, no signs of bowel obstruction are noted at this time. 2. Normal appendix. 3. Atherosclerosis. 4. Status post ORIF in the left hip where there is a healing intertrochanteric hip fracture. 5. Mild hepatic steatosis. 6. Additional incidental findings, as above.   Electronically Signed   By: Vinnie Langton M.D.   On: 05/03/2014 18:35   Dg Chest 1 View  05/09/2014   CLINICAL DATA:  Fever, shortness of Breath  EXAM: CHEST  1 VIEW  COMPARISON:  05/03/2014  FINDINGS: The cardiac shadow is at the upper limits of normal in size. The lungs are well aerated bilaterally. A fracture of the right sixth rib posteriorly and left seventh rib posteriorly are noted. They are better appreciated than on the [name] exam. No pneumothorax is noted. No focal infiltrate is seen.   IMPRESSION: Bilateral rib fractures without complicating factors. There are likely related to the patient's recent fall.   Electronically Signed   By: Inez Catalina M.D.   On: 05/09/2014 15:54   Dg Chest 2 View  05/03/2014   CLINICAL DATA:  [place] 2 days ago  EXAM: CHEST  2 VIEW  COMPARISON:  11/24/2013  FINDINGS: There is unchanged borderline cardiomegaly. The lungs are clear. There are no pleural effusions. Hilar and mediastinal contours appear unremarkable and unchanged. No displaced fractures are evident.  IMPRESSION: No active cardiopulmonary  disease.   Electronically Signed   By: Andreas Newport M.D.   On: 05/03/2014 01:00   Dg Wrist 2 Views Left  05/05/2014   CLINICAL DATA:  Followup left wrist fracture.  Subsequent encounter.  EXAM: LEFT WRIST - 2 VIEW  COMPARISON:  05/03/2014  FINDINGS: Fractures of the distal radial metaphysis and ulnar styloid process are again seen and remain in near anatomic alignment. A fiberglass cast has been applied. Carpal bones remain normal in appearance alignment.  IMPRESSION: Fractures of the distal radial metaphysis and ulnar styloid process, which remain in near anatomic alignment.   Electronically Signed   By: Earle Gell M.D.   On: 05/05/2014 20:16   Dg Wrist 2 Views Left  05/03/2014   CLINICAL DATA:  [place] last Wednesday injuring wrist trying to brace fall, pain swelling and bruising throughout LEFT wrist, initial encounter  EXAM: LEFT WRIST - 2 VIEW  COMPARISON:  Portable exam 1430 hours without priors for comparison.  FINDINGS: Diffuse osseous demineralization.  Transverse fracture distal LEFT radial metaphysis with dorsal tilt of distal radial articular surface and minimal displacement.  No definite intra-articular extension  Nondisplaced ulnar styloid fracture at tip.  Regional soft tissue swelling.  No additional fracture, dislocation or bone destruction.  IMPRESSION: Nondisplaced ulnar styloid fracture.  Angulated and minimally displaced distal LEFT radial  metaphyseal fracture.   Electronically Signed   By: Lavonia Dana M.D.   On: 05/03/2014 14:55   Ct Hip Left Wo Contrast  05/04/2014   CLINICAL DATA:  Left hip fracture.  EXAM: CT OF THE LEFT HIP WITHOUT CONTRAST  TECHNIQUE: Multidetector CT imaging of the left hip was performed according to the standard protocol. Multiplanar CT image reconstructions were also generated.  COMPARISON:  None.  FINDINGS: There is a comminuted left intertrochanteric fracture. There are 2 cannulated screws transfixing a healed femoral neck fracture. There is patchy sclerosis and lucency in the left femoral head which may reflect changes from bone remodeling secondary to fracture healing, but an element of avascular necrosis cannot be excluded.  There is no other fracture or dislocation. There is no lytic or sclerotic osseous lesion. There is soft tissue contusion in the overlying subcutaneous fat. The muscles are normal. There is no hematoma or fluid collection.  IMPRESSION: 1. Acute, comminuted left intertrochanteric fracture. 2. Two cannulated screws transfixing a healed femoral neck fracture.   Electronically Signed   By: Kathreen Devoid   On: 05/04/2014 17:55   Dg Foot 2 Views Left  05/05/2014   CLINICAL DATA:  Osteomyelitis left foot.  EXAM: LEFT FOOT - 2 VIEW  COMPARISON:  None.  FINDINGS: There is soft tissue swelling of the left foot. No subcutaneous gas. No osseous erosion to localize osteomyelitis.  IMPRESSION: No radiographic evidence of osteomyelitis.   Electronically Signed   By: Suzy Bouchard M.D.   On: 05/05/2014 20:18   Dg Hips Bilat With Pelvis 2v  05/02/2014   CLINICAL DATA:  Fall and landed on right hip yesterday.  EXAM: BILATERAL HIP (WITH PELVIS) 2 VIEWS  COMPARISON:  Plain film 11/24/2013  FINDINGS: Internal screw fixation of left hip again noted. No fracture or dislocation of the left or right hip. No pelvic fracture or sacral fracture.  IMPRESSION: 1. No acute fracture of the left or right hip. 2. Internal  fixation of left hip fracture again noted.   Electronically Signed   By: Suzy Bouchard M.D.   On: 05/02/2014 22:43   Dg Hip Port Unilat With  Pelvis 1v Left  05/05/2014   CLINICAL DATA:  Fixation left intertrochanteric fracture.  EXAM: LEFT HIP (WITH PELVIS) 1 VIEW PORTABLE  COMPARISON:  CT scan left hip 05/04/2014.  FINDINGS: Previously seen fixation screws for a subcapital hip fracture have been removed. New cerclage wires are in place for fixation of an intertrochanteric fracture. The wires are intact. Position and alignment appear anatomic. Gas in the soft tissues and surgical staples are noted.  IMPRESSION: Status post fixation of a left intertrochanteric fracture without evidence of complication.  Status post removal of screws for fixation of remote healed subcapital fracture. No retained hardware.   Electronically Signed   By: Inge Rise M.D.   On: 05/05/2014 20:01   Dg Hip Unilat With Pelvis 2-3 Views Left  05/09/2014   CLINICAL DATA:  Left hip pain after fall yesterday morning  EXAM: LEFT HIP (WITH PELVIS) 2-3 VIEWS  COMPARISON:  05/05/2014  FINDINGS: There is recent surgical fixation of an intertrochanteric left hip fracture. There is no interval change in the postoperative appearances of the left hip compared to 05/05/2014. There is no disruption of the fixation. There is no evidence of a superimposed acute fracture or acute dislocation. No acute soft tissue abnormality is evident.  IMPRESSION: Recent fixation of an intertrochanteric left hip fracture, with intact appearances unchanged from the postoperative radiograph of 05/05/2014. No acute findings are evident.   Electronically Signed   By: Andreas Newport M.D.   On: 05/09/2014 01:19    Microbiology: Recent Results (from the past 240 hour(s))  Surgical pcr screen     Status: None   Collection Time: 05/04/14  9:16 PM  Result Value Ref Range Status   MRSA, PCR NEGATIVE NEGATIVE Final   Staphylococcus aureus NEGATIVE NEGATIVE Final     Comment:        The Xpert SA Assay (FDA approved for NASAL specimens in patients over 33 years of age), is one component of a comprehensive surveillance program.  Test performance has been validated by [facility] for patients greater than or equal to 25 year old. It is not intended to diagnose infection nor to guide or monitor treatment.   Anaerobic culture     Status: None   Collection Time: 05/05/14  4:46 PM  Result Value Ref Range Status   Specimen Description HIP LEFT  Final   Special Requests NONE  Final   Gram Stain   Final    NO WBC SEEN NO ORGANISMS SEEN Performed at [facility]    Culture   Final    NO ANAEROBES ISOLATED Performed at [facility]    Report Status 05/10/2014 FINAL  Final  Body fluid culture     Status: None   Collection Time: 05/05/14  4:46 PM  Result Value Ref Range Status   Specimen Description HIP LEFT FLUID  Final   Special Requests NONE  Final   Gram Stain   Final    NO WBC SEEN NO ORGANISMS SEEN Performed at [facility]    Culture   Final    NO GROWTH 3 DAYS Performed at [facility]    Report Status 05/09/2014 FINAL  Final  Culture, Urine     Status: None   Collection Time: 05/07/14 11:23 AM  Result Value Ref Range Status   Specimen Description URINE, RANDOM PED BAG  Final   Special Requests Ancef Immunocompromised  Final   Colony Count NO GROWTH Performed at [facility]  Final   Culture NO GROWTH Performed at [facility]   Final   Report Status 05/08/2014 FINAL  Final     Labs: Basic Metabolic Panel:  Recent Labs Lab 05/07/14 0606 05/08/14 0540 05/09/14 0550 05/10/14 0744 05/11/14 0520  NA 138 139 139 138 137  K 3.4* 3.5 3.5 3.2* 3.7  CL 113* 111 110 106 106  CO2 19* 20* 20* 22 21*  GLUCOSE 187* 219* 164* 151* 184*  BUN 19 22* 23* 26* 28*  CREATININE 1.93* 1.87* 1.84* 1.94* 1.80*  CALCIUM 8.0* 8.4* 8.8* 8.6* 8.5*   Liver Function Tests:  Recent  Labs Lab 05/05/14 0533 05/06/14 0520  AST 15 17  ALT 14 11*  ALKPHOS 157* 144*  BILITOT 0.3 0.7  PROT 6.2* 6.2*  ALBUMIN 2.8* 2.9*   No results for input(s): LIPASE, AMYLASE in the last 168 hours. No results for input(s): AMMONIA in the last 168 hours. CBC:  Recent Labs Lab 05/05/14 0533 05/05/14 1845 05/06/14 0520 05/08/14 0540 05/09/14 0550 05/10/14 0744  WBC 9.4  --  9.5 11.0* 11.6* 8.9  NEUTROABS  --   --  7.6  --   --   --   HGB 9.0* 8.5* 10.2* 9.1* 8.9* 9.1*  HCT 29.5* 27.9* 33.0* 28.6* 28.3* 29.5*  MCV 91.0  --  90.4 91.7 90.7 89.7  PLT 275  --  228 280 330 342   Cardiac Enzymes: No results for input(s): CKTOTAL, CKMB, CKMBINDEX, TROPONINI in the last 168 hours. BNP: BNP (last 3 results) No results for input(s): BNP in the last 8760 hours.  ProBNP (last 3 results) No results for input(s): PROBNP in the last 8760 hours.  CBG:  Recent Labs Lab 05/10/14 1201 05/10/14 1706 05/10/14 2126 05/11/14 0800 05/11/14 1201  GLUCAP 136* 231* 203* 152* 185*       Signed:  Velvet Bathe  [facility] 05/11/2014, 2:42 PM

## 2014-05-12 DIAGNOSIS — S52532A Colles' fracture of left radius, initial encounter for closed fracture: Secondary | ICD-10-CM | POA: Insufficient documentation

## 2014-05-12 DIAGNOSIS — R509 Fever, unspecified: Secondary | ICD-10-CM | POA: Insufficient documentation

## 2014-05-12 LAB — GLUCOSE, CAPILLARY
GLUCOSE-CAPILLARY: 148 mg/dL — AB (ref 70–99)
Glucose-Capillary: 170 mg/dL — ABNORMAL HIGH (ref 70–99)
Glucose-Capillary: 234 mg/dL — ABNORMAL HIGH (ref 70–99)

## 2014-05-12 MED ORDER — DIPHENHYDRAMINE HCL 25 MG PO CAPS: 25.0000 mg | ORAL_CAPSULE | ORAL | Status: DC | PRN

## 2014-05-12 MED ORDER — DIPHENHYDRAMINE HCL 50 MG/ML IJ SOLN
25.0000 mg | Freq: Once | INTRAMUSCULAR | Status: AC
  Administered 2014-05-12: 25 mg via INTRAVENOUS
  Filled 2014-05-12: qty 1

## 2014-05-12 NOTE — Progress Notes (Signed)
Pt VSS. Pt IV removed. No further questions. [facility] arrived to take pt to facility. Pt safely transferred to stretcher. Kizzie Ide, RN

## 2014-05-12 NOTE — Plan of Care (Signed)
Patient seen, stable and symptom-free, appears to be in no distress whatsoever. Vital signs are stable. Will be discharged aspirin my partner's discharge summary yesterday no change in plans. Advise not to misuse narcotics or prescription medications.

## 2014-05-12 NOTE — Progress Notes (Signed)
Still waiting on insurance authorization from facility, [facility] care. They declined to accept a letter of guarantee from hospital until insurance authorization will come through.  Peri Maris, [place] 05/12/2014 3:41 PM [phone]

## 2014-05-12 NOTE — Clinical Social Work Placement (Signed)
   CLINICAL SOCIAL WORK PLACEMENT  NOTE  Date:  05/12/2014  Patient Details  Name: [name] MRN: [id number] Date of Birth: 17-Nov-1965  Clinical Social Work is seeking post-discharge placement for this patient at the [place] level of care (*CSW will initial, date and re-position this form in  chart as items are completed):  Yes   Patient/family provided with [facility] Work Department's list of facilities offering this level of care within the geographic area requested by the patient (or if unable, by the patient's family).  Yes   Patient/family informed of their freedom to choose among providers that offer the needed level of care, that participate in Medicare, Medicaid or managed care program needed by the patient, have an available bed and are willing to accept the patient.  Yes   Patient/family informed of [facility]'s ownership interest in [facility] and [facility], as well as of the fact that they are under no obligation to receive care at these facilities.  PASRR submitted to EDS on 05/04/14     PASRR number received on 05/04/14     Existing PASRR number confirmed on       FL2 transmitted to all facilities in geographic area requested by pt/family on       FL2 transmitted to all facilities within larger geographic area on       Patient informed that his/her managed care company has contracts with or will negotiate with certain facilities, including the following:        Yes   Patient/family informed of bed offers received.  Patient chooses bed at [facility]     Physician recommends and patient chooses bed at      Patient to be transferred to [facility] on 05/12/14.  Patient to be transferred to facility by [facility]     Patient family notified on 05/12/14 of transfer.  Name of family member notified:  Mrs. Myrene Buddy, mother     PHYSICIAN       Additional Comment:     _______________________________________________ Bo Mcclintock, LCSW 05/12/2014, 9:45 AM

## 2014-05-12 NOTE — Progress Notes (Signed)
[facility] has agreed to take LOG from hospital while waiting on insurance authorization.   CSW attempted to call Pt's mother to inform her of transportation being scheduled.   [facility] was called for transportation. RN notified. DC packet completed and placed on chart.  Peri Maris, [place] 05/12/2014 4:34 PM [phone]

## 2014-05-16 ENCOUNTER — Other Ambulatory Visit: Payer: Self-pay | Admitting: Infectious Diseases

## 2014-05-16 DIAGNOSIS — B20 Human immunodeficiency virus [HIV] disease: Secondary | ICD-10-CM

## 2014-05-20 ENCOUNTER — Telehealth: Payer: Self-pay | Admitting: *Deleted

## 2014-05-20 NOTE — Telephone Encounter (Signed)
The patient called and advised that she wants to pick up her Norco Rx. Asked her about the Oxy 5 mg #60 given on 05/09/14 and she advised she never got the Rx from [facility]. Advised her will ask the doctor and give her a call back she advised contact number [phone].

## 2014-05-20 NOTE — Telephone Encounter (Signed)
Can you verify that Rx was not picked up?

## 2014-05-26 NOTE — Telephone Encounter (Signed)
Called the patient back several times and was unable to get her. Will wait for her to call the office back.

## 2014-07-07 ENCOUNTER — Observation Stay (HOSPITAL_COMMUNITY)
Admission: EM | Admit: 2014-07-07 | Discharge: 2014-07-09 | Disposition: A | Discharge status: Skilled Nursing Facility | Payer: Medicare HMO | Attending: Specialist | Admitting: Specialist

## 2014-07-07 ENCOUNTER — Emergency Department (HOSPITAL_COMMUNITY): Payer: Medicare HMO

## 2014-07-07 ENCOUNTER — Encounter (HOSPITAL_COMMUNITY): Payer: Self-pay | Admitting: *Deleted

## 2014-07-07 DIAGNOSIS — Z21 Asymptomatic human immunodeficiency virus [HIV] infection status: Secondary | ICD-10-CM | POA: Insufficient documentation

## 2014-07-07 DIAGNOSIS — W010XXA Fall on same level from slipping, tripping and stumbling without subsequent striking against object, initial encounter: Secondary | ICD-10-CM | POA: Insufficient documentation

## 2014-07-07 DIAGNOSIS — Z888 Allergy status to other drugs, medicaments and biological substances status: Secondary | ICD-10-CM | POA: Insufficient documentation

## 2014-07-07 DIAGNOSIS — IMO0002 Reserved for concepts with insufficient information to code with codable children: Secondary | ICD-10-CM | POA: Diagnosis present

## 2014-07-07 DIAGNOSIS — Y929 Unspecified place or not applicable: Secondary | ICD-10-CM | POA: Diagnosis not present

## 2014-07-07 DIAGNOSIS — E1121 Type 2 diabetes mellitus with diabetic nephropathy: Secondary | ICD-10-CM | POA: Diagnosis not present

## 2014-07-07 DIAGNOSIS — F329 Major depressive disorder, single episode, unspecified: Secondary | ICD-10-CM | POA: Diagnosis not present

## 2014-07-07 DIAGNOSIS — K219 Gastro-esophageal reflux disease without esophagitis: Secondary | ICD-10-CM | POA: Insufficient documentation

## 2014-07-07 DIAGNOSIS — N189 Chronic kidney disease, unspecified: Secondary | ICD-10-CM | POA: Diagnosis not present

## 2014-07-07 DIAGNOSIS — F17210 Nicotine dependence, cigarettes, uncomplicated: Secondary | ICD-10-CM | POA: Diagnosis not present

## 2014-07-07 DIAGNOSIS — Z79899 Other long term (current) drug therapy: Secondary | ICD-10-CM | POA: Insufficient documentation

## 2014-07-07 DIAGNOSIS — M810 Age-related osteoporosis without current pathological fracture: Secondary | ICD-10-CM | POA: Diagnosis not present

## 2014-07-07 DIAGNOSIS — J45909 Unspecified asthma, uncomplicated: Secondary | ICD-10-CM | POA: Diagnosis not present

## 2014-07-07 DIAGNOSIS — E1140 Type 2 diabetes mellitus with diabetic neuropathy, unspecified: Secondary | ICD-10-CM | POA: Insufficient documentation

## 2014-07-07 DIAGNOSIS — Z794 Long term (current) use of insulin: Secondary | ICD-10-CM | POA: Diagnosis not present

## 2014-07-07 DIAGNOSIS — Z96649 Presence of unspecified artificial hip joint: Secondary | ICD-10-CM

## 2014-07-07 DIAGNOSIS — S72409A Unspecified fracture of lower end of unspecified femur, initial encounter for closed fracture: Principal | ICD-10-CM | POA: Insufficient documentation

## 2014-07-07 DIAGNOSIS — M1612 Unilateral primary osteoarthritis, left hip: Secondary | ICD-10-CM | POA: Insufficient documentation

## 2014-07-07 DIAGNOSIS — B20 Human immunodeficiency virus [HIV] disease: Secondary | ICD-10-CM | POA: Diagnosis present

## 2014-07-07 DIAGNOSIS — Y939 Activity, unspecified: Secondary | ICD-10-CM | POA: Diagnosis not present

## 2014-07-07 DIAGNOSIS — S72402A Unspecified fracture of lower end of left femur, initial encounter for closed fracture: Secondary | ICD-10-CM | POA: Diagnosis present

## 2014-07-07 DIAGNOSIS — S52532A Colles' fracture of left radius, initial encounter for closed fracture: Secondary | ICD-10-CM | POA: Diagnosis present

## 2014-07-07 DIAGNOSIS — F419 Anxiety disorder, unspecified: Secondary | ICD-10-CM | POA: Insufficient documentation

## 2014-07-07 DIAGNOSIS — E1165 Type 2 diabetes mellitus with hyperglycemia: Secondary | ICD-10-CM | POA: Diagnosis present

## 2014-07-07 DIAGNOSIS — S7292XA Unspecified fracture of left femur, initial encounter for closed fracture: Secondary | ICD-10-CM | POA: Diagnosis present

## 2014-07-07 DIAGNOSIS — S72402D Unspecified fracture of lower end of left femur, subsequent encounter for closed fracture with routine healing: Secondary | ICD-10-CM

## 2014-07-07 DIAGNOSIS — E118 Type 2 diabetes mellitus with unspecified complications: Secondary | ICD-10-CM

## 2014-07-07 DIAGNOSIS — I129 Hypertensive chronic kidney disease with stage 1 through stage 4 chronic kidney disease, or unspecified chronic kidney disease: Secondary | ICD-10-CM | POA: Diagnosis not present

## 2014-07-07 DIAGNOSIS — R296 Repeated falls: Secondary | ICD-10-CM | POA: Diagnosis not present

## 2014-07-07 DIAGNOSIS — W19XXXA Unspecified fall, initial encounter: Secondary | ICD-10-CM

## 2014-07-07 MED ORDER — HYDROMORPHONE HCL 1 MG/ML IJ SOLN
1.0000 mg | Freq: Once | INTRAMUSCULAR | Status: AC
  Administered 2014-07-07: 1 mg via INTRAVENOUS
  Filled 2014-07-07: qty 1

## 2014-07-07 MED ORDER — KETOROLAC TROMETHAMINE 30 MG/ML IJ SOLN
30.0000 mg | Freq: Once | INTRAMUSCULAR | Status: AC
  Administered 2014-07-07: 30 mg via INTRAVENOUS
  Filled 2014-07-07: qty 1

## 2014-07-07 MED ORDER — ONDANSETRON HCL 4 MG/2ML IJ SOLN
4.0000 mg | Freq: Once | INTRAMUSCULAR | Status: AC
  Administered 2014-07-07: 4 mg via INTRAVENOUS
  Filled 2014-07-07: qty 2

## 2014-07-07 MED ORDER — LORAZEPAM 2 MG/ML IJ SOLN
0.5000 mg | Freq: Once | INTRAMUSCULAR | Status: AC
  Administered 2014-07-07: 0.5 mg via INTRAVENOUS
  Filled 2014-07-07: qty 1

## 2014-07-07 NOTE — ED Notes (Signed)
Bed: [id number] Expected date:  Expected time:  Means of arrival:  Comments: EMS/nsg home pt/fall

## 2014-07-07 NOTE — ED Notes (Signed)
Patient transported to X-ray 

## 2014-07-07 NOTE — ED Notes (Signed)
Pt. Given ice pack.

## 2014-07-07 NOTE — ED Provider Notes (Signed)
CSN: [id number]     Arrival date & time 07/07/14  2040 History   First MD Initiated Contact with Patient 07/07/14 2046     Chief Complaint  Patient presents with  . Fall  . Knee Pain    left     (Consider location/radiation/quality/duration/timing/severity/associated sxs/prior Treatment) HPI   PCP: SPENCER,SARA C, PA-C Blood pressure 148/71, pulse 83, temperature 98.9 F (37.2 C), temperature source Oral, resp. rate 20, last menstrual period 11/16/2010, SpO2 94 %.  HPI: BP is a 49 y/o female w/ h/o diabetes, peripheral neuropathy, and falls most recently in May resulting in left hip pain (hip arthroplasty on 5/4) and left wrist fracture, who presents to the ED following a fall this evening. She slipped on the floor, falling onto her left side, and a nearby table came down and fell on her her left side hitting her head. She had no loss of consciousness and was on the ground for about 10 minutes until staff from [facility] got her up and called EMS. She was given oxycodone and fentanyl 100 mcg by EMS for pain. She reports having 10/10 knee pain and a mild headache that she attributes to crying, she has not had any nausea or vomiting since the fall. She reports that the fall was mechanical from slipping and that she does not have much sensation in her feet from her neuropathy. She denies chest pain, dizziness, SOB, feeling hypoglycemic, or having palpitations prior to the fall.    Past Medical History  Diagnosis Date  . Anxiety   . Depression   . Hypertension   . HIV infection 2000  . Asthma     HOSPITALIZED WITH EXCERBATION OF ASTHMA - AND BRONCHITIS AND THE FLU DEC 2013  . Shortness of breath     ALLERGIES ARE "ACTING UP"  . Diabetes mellitus     ON INSULIN AND ORAL MEDICATIONS  . GERD (gastroesophageal reflux disease)   . Neuropathy     NEUROPATHY HANDS AND FEET  . Arthritis     LEFT HIP  . Pain     SEVERE PAIN LEFT HIP - HX OF LEFT HIP PINNING JULY 2013    Past Surgical History  Procedure Laterality Date  . Hip pinning,cannulated  07/16/2011    Procedure: CANNULATED HIP PINNING;  Surgeon: Gearlean Alf, MD;  Location: [facility];  Service: Orthopedics;  Laterality: Left;  . Hip pinning,cannulated Left 04/09/2012    Procedure: CANNULATED HIP PINNING AND HARDWARE REVISION;  Surgeon: Gearlean Alf, MD;  Location: [facility];  Service: Orthopedics;  Laterality: Left;  . Hip arthroplasty Left 05/05/2014    Procedure: ARTHROPLASTY OPEN REDUCTION INTERNAL FIXATION LEFT HIP AND REMOVAL OF TWO CANNULATED SCREW;  Surgeon: Latanya Maudlin, MD;  Location: [facility];  Service: Orthopedics;  Laterality: Left;  . Cast application Left [id number]    Procedure: CAST APPLICATION (FIBERGLASS);  Surgeon: Latanya Maudlin, MD;  Location: [facility];  Service: Orthopedics;  Laterality: Left;  CLOSED REDUCTION OF LEFT COLLES FRACTURE WITH SHORT ARM CAST  . Hardware removal Left 05/05/2014    Procedure: HARDWARE REMOVAL LEFT HIP;  Surgeon: Latanya Maudlin, MD;  Location: [facility];  Service: Orthopedics;  Laterality: Left;  REMOVAL BIOMET 6.5-8.0 CANNULATED SCREW   Family History  Problem Relation Age of Onset  . Diabetes Mother   . Hypertension Mother   . Vision loss Mother   . Hypertension Father    History  Substance Use Topics  . Smoking status: Current Every  Day Smoker -- 0.50 packs/day for 35 years    Types: E-cigarettes  . Smokeless tobacco: Never Used  . Alcohol Use: No     Comment: no drink since 2008   OB History    Gravida Para Term Preterm AB TAB SAB Ectopic Multiple Living   2 1 1  0 1  1 0 0 1     Review of Systems 10 Systems reviewed and are negative for acute change except as noted in the HPI.   Allergies  Cortizone-10  Home Medications   Prior to Admission medications   Medication Sig Start Date End Date Taking? Authorizing Provider  albuterol (PROVENTIL) (2.5 MG/3ML) 0.083% nebulizer solution Take 2.5 mg by nebulization every 6 (six) hours as needed for  wheezing. For wheezing or shortness of breath 01/01/12  Yes Hosie Poisson, MD  ARIPiprazole (ABILIFY) 2 MG tablet Take 2 mg by mouth daily. Mental health   Yes Historical Provider, MD  aspirin EC 325 MG tablet Take 325 mg by mouth 2 (two) times daily. Prophylaxis.   Yes Historical Provider, MD  atenolol (TENORMIN) 25 MG tablet Take 25 mg by mouth every morning.    Yes Historical Provider, MD  atorvastatin (LIPITOR) 40 MG tablet Take 40 mg by mouth daily.   Yes Historical Provider, MD  ATRIPLA 600-200-300 MG per tablet TAKE 1 TABLET BY MOUTH EVERY NIGHT AT BEDTIME Patient taking differently: Take 1 tablet by mouth every night at bedtime. 05/17/14  Yes Campbell Riches, MD  budesonide-formoterol [facility]) 80-4.5 MCG/ACT inhaler Inhale 2 puffs into the lungs 2 (two) times daily. 01/01/12  Yes Hosie Poisson, MD  Cholecalciferol (VITAMIN D3) 5000 UNITS TABS Take 1 tablet by mouth daily.   Yes Historical Provider, MD  clonazePAM (KLONOPIN) 0.5 MG tablet Take 0.5 mg by mouth 4 (four) times daily.   Yes Historical Provider, MD  escitalopram (LEXAPRO) 20 MG tablet Take 20 mg by mouth daily.   Yes Historical Provider, MD  eszopiclone (LUNESTA) 2 MG TABS tablet Take 2 mg by mouth at bedtime. Take immediately before bedtime   Yes Historical Provider, MD  feeding supplement, GLUCERNA SHAKE, (GLUCERNA SHAKE) LIQD Take 237 mLs by mouth 3 (three) times daily between meals. 05/11/14  Yes Velvet Bathe, MD  fenofibrate micronized (LOFIBRA) 200 MG capsule Take 200 mg by mouth daily before breakfast.   Yes Historical Provider, MD  insulin aspart (NOVOLOG) 100 UNIT/ML injection Inject 0-10 Units into the skin 2 (two) times daily. Per sliding scale:  151-200 =  2 201-250 =  3 251-300 =  4 301-350 =  6 351-400 =  8 401+      = 10 units and call provider.   Yes Historical Provider, MD  insulin glargine (LANTUS) 100 UNIT/ML injection Inject 0.35 mLs (35 Units total) into the skin daily. Patient taking differently: Inject  50 Units into the skin daily.  05/11/14  Yes Velvet Bathe, MD  methocarbamol (ROBAXIN) 500 MG tablet Take 500 mg by mouth at bedtime.   Yes Historical Provider, MD  Multiple Vitamins-Minerals (MULTIVITAMIN WITH MINERALS) tablet Take 1 tablet by mouth daily.   Yes Historical Provider, MD  oxyCODONE (OXY IR/ROXICODONE) 5 MG immediate release tablet Take 1-3 tablets (5-15 mg total) by mouth every 4 (four) hours as needed for moderate pain or severe pain. Patient taking differently: Take 5-15 mg by mouth every 4 (four) hours as needed for moderate pain or severe pain. 5 mg for mild pain, 10 mg for moderate pain, 15 mg  for severe pain. 05/09/14  Yes Amber Cecilio Asper, PA-C  OxyCODONE (OXYCONTIN) 10 mg T12A 12 hr tablet Take 10 mg by mouth 2 (two) times daily. Left hip pain.   Yes Historical Provider, MD  pantoprazole (PROTONIX) 40 MG tablet Take 40 mg by mouth every morning.  07/13/10  Yes Lyndee Hensen, NP  pregabalin (LYRICA) 225 MG capsule Take 225 mg by mouth 2 (two) times daily.   Yes Historical Provider, MD  vitamin E (VITAMIN E) 1000 UNIT capsule Take 1,000 Units by mouth daily.    Yes Historical Provider, MD  enoxaparin (LOVENOX) 40 MG/0.4ML injection Inject 0.4 mLs (40 mg total) into the skin daily. Patient not taking: Reported on 07/07/2014 05/09/14   Ardeen Jourdain, PA-C  fenofibrate (TRICOR) 145 MG tablet Take 1 tablet (145 mg total) by mouth daily. Patient not taking: Reported on 07/07/2014 01/06/13   Campbell Riches, MD   BP 135/65 mmHg  Pulse 87  Temp(Src) 98.8 F (37.1 C) (Oral)  Resp 20  SpO2 94%  LMP 11/16/2010 Physical Exam  Constitutional: She appears well-developed and well-nourished. No distress.  HENT:  Head: Normocephalic. Head is with raccoon's eyes (to the left infraorbit- very minimal tenderness to palpation.).  Eyes: Pupils are equal, round, and reactive to light.  Neck: Normal range of motion. Neck supple. No spinous process tenderness and no muscular tenderness  present.  Cardiovascular: Normal rate and regular rhythm.   Pulmonary/Chest: Effort normal and breath sounds normal.  Abdominal: Soft.  Musculoskeletal:       Left knee: She exhibits decreased range of motion, swelling and effusion. She exhibits no ecchymosis and no deformity. Tenderness found. Medial joint line and lateral joint line tenderness noted.  Pt has significant tenderness to the knee and will not allow me to ROM the leg. She has significant amount of swelling. No obvious deformities.  Her bilateral lower extremities are swollen symmetrically with 2-3+ pitting edema. Due to neuropathy she does not have normal sensation to touch but pedal pulses are palpable.  Neurological: She is alert.  Skin: Skin is warm and dry.  Nursing note and vitals reviewed.   ED Course  Procedures (including critical care time) Labs Review Labs Reviewed  CBC WITH DIFFERENTIAL/PLATELET - Abnormal; Notable for the following:    WBC 10.7 (*)    RBC 3.46 (*)    Hemoglobin 9.9 (*)    HCT 31.8 (*)    RDW 16.0 (*)    Neutro Abs 8.3 (*)    All other components within normal limits  BASIC METABOLIC PANEL - Abnormal; Notable for the following:    Glucose, Bld 172 (*)    BUN 28 (*)    Creatinine, Ser 1.97 (*)    Calcium 8.5 (*)    GFR calc non Af Amer 28 (*)    GFR calc Af Amer 33 (*)    All other components within normal limits    Imaging Review Dg Knee Complete 4 Views Left  07/07/2014   CLINICAL DATA:  Fall with left knee pain and swelling. Initial encounter.  EXAM: LEFT KNEE - COMPLETE 4+ VIEW  COMPARISON:  None.  FINDINGS: There is a medial supracondylar fracture with mild impaction. The fracture could extend to 1 of the femoral condyles based on the lateral projection, suggest CT. No evidence articular surface offset. There is a large knee joint effusion with probable lipomatous component. Marked osteopenia.  IMPRESSION: Distal femur fracture, as above.   Electronically Signed   By: Angelica Chessman  Watts  M.D.   On: 07/07/2014 22:48     EKG Interpretation None      MDM   Final diagnoses:  Fracture of distal femur, left, closed, initial encounter    Dr. Tonita Cong and I have discussed the patient, he feels that this fracture may potentially be able to be treated without surgical intervention but will need to be evaluated by ortho. She is currently NVI.   Medically stable.  Knee immobilizer placed and in comfortable flexion. Dr. Posey Pronto with [facility] will consult and help manage her medical conditions Dr. Tonita Cong will be the admitting physician Temp admit orders placed follow-up the hip/femur fracture protocol. The patients pain has been difficult to control but she is aware of the findings and agreeable to the plan.  Filed Vitals:   07/08/14 0117  BP: 135/65  Pulse: 87  Temp: 98.8 F (37.1 C)  Resp: [address] PA-C 07/08/14 0140  Pamella Pert, MD 07/08/14 1517

## 2014-07-07 NOTE — ED Notes (Signed)
EMS contacted by facility [facility] health care. Patient slipped on something in floor falling accidentally pulling a table down on top of her. [patient reports striking head and felt her knee twist. No LOC but c/o left knee pain. Pt is a&ox4, no blood thinners. Oxycodone given and fentanyl 100 mcg at 2035

## 2014-07-08 ENCOUNTER — Observation Stay (HOSPITAL_COMMUNITY): Payer: Medicare HMO

## 2014-07-08 ENCOUNTER — Inpatient Hospital Stay (HOSPITAL_COMMUNITY): Payer: Medicare HMO

## 2014-07-08 DIAGNOSIS — S7292XA Unspecified fracture of left femur, initial encounter for closed fracture: Secondary | ICD-10-CM

## 2014-07-08 DIAGNOSIS — S72402A Unspecified fracture of lower end of left femur, initial encounter for closed fracture: Secondary | ICD-10-CM

## 2014-07-08 DIAGNOSIS — E118 Type 2 diabetes mellitus with unspecified complications: Secondary | ICD-10-CM

## 2014-07-08 DIAGNOSIS — B20 Human immunodeficiency virus [HIV] disease: Secondary | ICD-10-CM

## 2014-07-08 DIAGNOSIS — R296 Repeated falls: Secondary | ICD-10-CM

## 2014-07-08 DIAGNOSIS — S72409A Unspecified fracture of lower end of unspecified femur, initial encounter for closed fracture: Secondary | ICD-10-CM

## 2014-07-08 DIAGNOSIS — M810 Age-related osteoporosis without current pathological fracture: Secondary | ICD-10-CM | POA: Diagnosis not present

## 2014-07-08 DIAGNOSIS — S72402D Unspecified fracture of lower end of left femur, subsequent encounter for closed fracture with routine healing: Secondary | ICD-10-CM

## 2014-07-08 HISTORY — DX: Unspecified fracture of left femur, initial encounter for closed fracture: S72.92XA

## 2014-07-08 HISTORY — DX: Unspecified fracture of lower end of unspecified femur, initial encounter for closed fracture: S72.409A

## 2014-07-08 HISTORY — DX: Age-related osteoporosis without current pathological fracture: M81.0

## 2014-07-08 LAB — CBC WITH DIFFERENTIAL/PLATELET
Basophils Absolute: 0.1 10*3/uL (ref 0.0–0.1)
Basophils Absolute: 0.1 10*3/uL (ref 0.0–0.1)
Basophils Relative: 1 % (ref 0–1)
Basophils Relative: 1 % (ref 0–1)
Eosinophils Absolute: 0.2 10*3/uL (ref 0.0–0.7)
Eosinophils Absolute: 0.3 10*3/uL (ref 0.0–0.7)
Eosinophils Relative: 2 % (ref 0–5)
Eosinophils Relative: 3 % (ref 0–5)
HCT: 31.8 % — ABNORMAL LOW (ref 36.0–46.0)
HCT: 33 % — ABNORMAL LOW (ref 36.0–46.0)
HEMOGLOBIN: 9.9 g/dL — AB (ref 12.0–15.0)
Hemoglobin: 9.9 g/dL — ABNORMAL LOW (ref 12.0–15.0)
LYMPHS ABS: 1.5 10*3/uL (ref 0.7–4.0)
Lymphocytes Relative: 14 % (ref 12–46)
Lymphocytes Relative: 17 % (ref 12–46)
Lymphs Abs: 1.8 10*3/uL (ref 0.7–4.0)
MCH: 27.6 pg (ref 26.0–34.0)
MCH: 28.6 pg (ref 26.0–34.0)
MCHC: 30 g/dL (ref 30.0–36.0)
MCHC: 31.1 g/dL (ref 30.0–36.0)
MCV: 91.9 fL (ref 78.0–100.0)
MCV: 91.9 fL (ref 78.0–100.0)
MONO ABS: 0.6 10*3/uL (ref 0.1–1.0)
MONOS PCT: 6 % (ref 3–12)
Monocytes Absolute: 0.6 10*3/uL (ref 0.1–1.0)
Monocytes Relative: 6 % (ref 3–12)
NEUTROS ABS: 8.3 10*3/uL — AB (ref 1.7–7.7)
NEUTROS PCT: 73 % (ref 43–77)
NEUTROS PCT: 77 % (ref 43–77)
Neutro Abs: 7.7 10*3/uL (ref 1.7–7.7)
PLATELETS: 354 10*3/uL (ref 150–400)
Platelets: 363 10*3/uL (ref 150–400)
RBC: 3.46 MIL/uL — ABNORMAL LOW (ref 3.87–5.11)
RBC: 3.59 MIL/uL — ABNORMAL LOW (ref 3.87–5.11)
RDW: 15.9 % — ABNORMAL HIGH (ref 11.5–15.5)
RDW: 16 % — AB (ref 11.5–15.5)
WBC: 10.5 10*3/uL (ref 4.0–10.5)
WBC: 10.7 10*3/uL — ABNORMAL HIGH (ref 4.0–10.5)

## 2014-07-08 LAB — GLUCOSE, CAPILLARY
GLUCOSE-CAPILLARY: 126 mg/dL — AB (ref 65–99)
Glucose-Capillary: 155 mg/dL — ABNORMAL HIGH (ref 65–99)
Glucose-Capillary: 216 mg/dL — ABNORMAL HIGH (ref 65–99)
Glucose-Capillary: 137 mg/dL — ABNORMAL HIGH (ref 65–99)

## 2014-07-08 LAB — BASIC METABOLIC PANEL
Anion gap: 8 (ref 5–15)
BUN: 28 mg/dL — ABNORMAL HIGH (ref 6–20)
CALCIUM: 8.5 mg/dL — AB (ref 8.9–10.3)
CHLORIDE: 107 mmol/L (ref 101–111)
CO2: 24 mmol/L (ref 22–32)
Creatinine, Ser: 1.97 mg/dL — ABNORMAL HIGH (ref 0.44–1.00)
GFR calc Af Amer: 33 mL/min — ABNORMAL LOW (ref >60)
GFR calc non Af Amer: 28 mL/min — ABNORMAL LOW (ref >60)
GLUCOSE: 172 mg/dL — AB (ref 65–99)
Potassium: 3.7 mmol/L (ref 3.5–5.1)
Sodium: 139 mmol/L (ref 135–145)

## 2014-07-08 LAB — TYPE AND SCREEN
ABO/RH(D): O NEG
Antibody Screen: NEGATIVE

## 2014-07-08 LAB — PROTIME-INR
INR: 1.11 (ref 0.00–1.49)
Prothrombin Time: 14.5 seconds (ref 11.6–15.2)

## 2014-07-08 LAB — COMPREHENSIVE METABOLIC PANEL
ALBUMIN: 3.1 g/dL — AB (ref 3.5–5.0)
ALT: 10 U/L — ABNORMAL LOW (ref 14–54)
AST: 16 U/L (ref 15–41)
Alkaline Phosphatase: 266 U/L — ABNORMAL HIGH (ref 38–126)
Anion gap: 7 (ref 5–15)
BUN: 28 mg/dL — ABNORMAL HIGH (ref 6–20)
CALCIUM: 8.5 mg/dL — AB (ref 8.9–10.3)
CHLORIDE: 106 mmol/L (ref 101–111)
CO2: 25 mmol/L (ref 22–32)
Creatinine, Ser: 1.96 mg/dL — ABNORMAL HIGH (ref 0.44–1.00)
GFR calc Af Amer: 33 mL/min — ABNORMAL LOW (ref >60)
GFR calc non Af Amer: 29 mL/min — ABNORMAL LOW (ref >60)
Glucose, Bld: 140 mg/dL — ABNORMAL HIGH (ref 65–99)
Potassium: 3.6 mmol/L (ref 3.5–5.1)
SODIUM: 138 mmol/L (ref 135–145)
Total Bilirubin: 0.4 mg/dL (ref 0.3–1.2)
Total Protein: 6.6 g/dL (ref 6.5–8.1)

## 2014-07-08 MED ORDER — ATENOLOL 25 MG PO TABS
25.0000 mg | ORAL_TABLET | Freq: Every morning | ORAL | Status: DC
  Administered 2014-07-08: 25 mg via ORAL
  Filled 2014-07-08 (×2): qty 1

## 2014-07-08 MED ORDER — PREGABALIN 75 MG PO CAPS
225.0000 mg | ORAL_CAPSULE | Freq: Two times a day (BID) | ORAL | Status: DC
  Administered 2014-07-08 – 2014-07-09 (×4): 225 mg via ORAL
  Filled 2014-07-08 (×4): qty 3

## 2014-07-08 MED ORDER — DOCUSATE SODIUM 100 MG PO CAPS: 100.0000 mg | ORAL_CAPSULE | Freq: Two times a day (BID) | ORAL | Status: DC

## 2014-07-08 MED ORDER — ESCITALOPRAM OXALATE 20 MG PO TABS
20.0000 mg | ORAL_TABLET | Freq: Every day | ORAL | Status: DC
  Administered 2014-07-08 – 2014-07-09 (×2): 20 mg via ORAL
  Filled 2014-07-08 (×2): qty 1

## 2014-07-08 MED ORDER — HYDROMORPHONE HCL 1 MG/ML IJ SOLN
1.0000 mg | Freq: Once | INTRAMUSCULAR | Status: AC
  Administered 2014-07-08: 1 mg via INTRAVENOUS
  Filled 2014-07-08: qty 1

## 2014-07-08 MED ORDER — EFAVIRENZ-EMTRICITAB-TENOFOVIR 600-200-300 MG PO TABS
1.0000 | ORAL_TABLET | Freq: Every day | ORAL | Status: DC
  Administered 2014-07-08: 1 via ORAL
  Filled 2014-07-08 (×2): qty 1

## 2014-07-08 MED ORDER — INSULIN ASPART 100 UNIT/ML ~~LOC~~ SOLN: 0.0000 [IU] | Freq: Every day | SUBCUTANEOUS | Status: DC

## 2014-07-08 MED ORDER — FERROUS SULFATE 325 (65 FE) MG PO TABS
325.0000 mg | ORAL_TABLET | Freq: Three times a day (TID) | ORAL | Status: DC
  Administered 2014-07-08 (×3): 325 mg via ORAL
  Filled 2014-07-08 (×7): qty 1

## 2014-07-08 MED ORDER — PANTOPRAZOLE SODIUM 40 MG PO TBEC
40.0000 mg | DELAYED_RELEASE_TABLET | Freq: Every morning | ORAL | Status: DC
  Administered 2014-07-08 – 2014-07-09 (×2): 40 mg via ORAL
  Filled 2014-07-08 (×3): qty 1

## 2014-07-08 MED ORDER — SENNOSIDES-DOCUSATE SODIUM 8.6-50 MG PO TABS: 1.0000 | ORAL_TABLET | Freq: Every evening | ORAL | Status: DC | PRN

## 2014-07-08 MED ORDER — ARIPIPRAZOLE 2 MG PO TABS
2.0000 mg | ORAL_TABLET | Freq: Every day | ORAL | Status: DC
  Administered 2014-07-08 – 2014-07-09 (×2): 2 mg via ORAL
  Filled 2014-07-08 (×2): qty 1

## 2014-07-08 MED ORDER — HYDROMORPHONE HCL 1 MG/ML IJ SOLN
0.5000 mg | INTRAMUSCULAR | Status: DC | PRN
  Administered 2014-07-08 – 2014-07-09 (×6): 0.5 mg via INTRAVENOUS
  Filled 2014-07-08 (×7): qty 1

## 2014-07-08 MED ORDER — ASPIRIN EC 325 MG PO TBEC: 325.0000 mg | DELAYED_RELEASE_TABLET | Freq: Every day | ORAL | Status: DC

## 2014-07-08 MED ORDER — DOCUSATE SODIUM 100 MG PO CAPS
100.0000 mg | ORAL_CAPSULE | Freq: Two times a day (BID) | ORAL | Status: DC
  Administered 2014-07-08: 100 mg via ORAL

## 2014-07-08 MED ORDER — ZOLPIDEM TARTRATE 5 MG PO TABS: 5.0000 mg | ORAL_TABLET | Freq: Every evening | ORAL | Status: DC | PRN

## 2014-07-08 MED ORDER — INSULIN GLARGINE 100 UNIT/ML ~~LOC~~ SOLN
35.0000 [IU] | Freq: Every day | SUBCUTANEOUS | Status: DC
  Administered 2014-07-08 – 2014-07-09 (×2): 35 [IU] via SUBCUTANEOUS
  Filled 2014-07-08 (×2): qty 0.35

## 2014-07-08 MED ORDER — HEPARIN SODIUM (PORCINE) 5000 UNIT/ML IJ SOLN
5000.0000 [IU] | Freq: Three times a day (TID) | INTRAMUSCULAR | Status: DC
  Administered 2014-07-08 – 2014-07-09 (×5): 5000 [IU] via SUBCUTANEOUS
  Filled 2014-07-08 (×7): qty 1

## 2014-07-08 MED ORDER — CLONAZEPAM 0.5 MG PO TABS
0.5000 mg | ORAL_TABLET | Freq: Four times a day (QID) | ORAL | Status: DC | PRN
  Administered 2014-07-08 – 2014-07-09 (×2): 0.5 mg via ORAL
  Filled 2014-07-08 (×2): qty 1

## 2014-07-08 MED ORDER — ASPIRIN EC 325 MG PO TBEC
325.0000 mg | DELAYED_RELEASE_TABLET | Freq: Every day | ORAL | Status: DC
  Administered 2014-07-08 – 2014-07-09 (×2): 325 mg via ORAL
  Filled 2014-07-08 (×2): qty 1

## 2014-07-08 MED ORDER — OXYCODONE HCL ER 15 MG PO T12A
15.0000 mg | EXTENDED_RELEASE_TABLET | Freq: Two times a day (BID) | ORAL | Status: DC
  Administered 2014-07-08 – 2014-07-09 (×4): 15 mg via ORAL
  Filled 2014-07-08 (×4): qty 1

## 2014-07-08 MED ORDER — HYDROCODONE-ACETAMINOPHEN 5-325 MG PO TABS
1.0000 | ORAL_TABLET | Freq: Four times a day (QID) | ORAL | Status: DC | PRN
  Administered 2014-07-08 – 2014-07-09 (×6): 2 via ORAL
  Filled 2014-07-08 (×6): qty 2

## 2014-07-08 MED ORDER — METHOCARBAMOL 500 MG PO TABS: 500.0000 mg | ORAL_TABLET | Freq: Four times a day (QID) | ORAL | Status: DC | PRN

## 2014-07-08 MED ORDER — DEXTROSE 5 % IV SOLN
500.0000 mg | Freq: Four times a day (QID) | INTRAVENOUS | Status: DC | PRN
  Filled 2014-07-08: qty 5

## 2014-07-08 MED ORDER — MAGNESIUM CITRATE PO SOLN: 1.0000 | Freq: Once | ORAL | Status: AC | PRN

## 2014-07-08 MED ORDER — POLYETHYLENE GLYCOL 3350 17 G PO PACK: 17.0000 g | PACK | Freq: Every day | ORAL | Status: DC | PRN

## 2014-07-08 MED ORDER — HYDROMORPHONE HCL 1 MG/ML IJ SOLN: 1.0000 mg | INTRAMUSCULAR | Status: DC | PRN

## 2014-07-08 MED ORDER — ONDANSETRON HCL 4 MG/2ML IJ SOLN: 4.0000 mg | Freq: Three times a day (TID) | INTRAMUSCULAR | Status: AC | PRN

## 2014-07-08 MED ORDER — HYDROMORPHONE HCL 1 MG/ML IJ SOLN
0.5000 mg | INTRAMUSCULAR | Status: DC | PRN
  Administered 2014-07-08 – 2014-07-09 (×4): 0.5 mg via INTRAVENOUS
  Filled 2014-07-08 (×3): qty 1

## 2014-07-08 MED ORDER — BISACODYL 5 MG PO TBEC: 5.0000 mg | DELAYED_RELEASE_TABLET | Freq: Every day | ORAL | Status: DC | PRN

## 2014-07-08 MED ORDER — GLUCERNA SHAKE PO LIQD
237.0000 mL | Freq: Three times a day (TID) | ORAL | Status: DC
  Administered 2014-07-08 – 2014-07-09 (×4): 237 mL via ORAL
  Filled 2014-07-08 (×6): qty 237

## 2014-07-08 MED ORDER — INSULIN ASPART 100 UNIT/ML ~~LOC~~ SOLN
0.0000 [IU] | Freq: Three times a day (TID) | SUBCUTANEOUS | Status: DC
  Administered 2014-07-08: 2 [IU] via SUBCUTANEOUS
  Administered 2014-07-08: 5 [IU] via SUBCUTANEOUS
  Administered 2014-07-08: 3 [IU] via SUBCUTANEOUS

## 2014-07-08 MED ORDER — CYCLOBENZAPRINE HCL 10 MG PO TABS
10.0000 mg | ORAL_TABLET | Freq: Three times a day (TID) | ORAL | Status: DC | PRN
  Administered 2014-07-08 – 2014-07-09 (×3): 10 mg via ORAL
  Filled 2014-07-08 (×3): qty 1

## 2014-07-08 MED ORDER — HYDROMORPHONE HCL 2 MG/ML IJ SOLN
2.0000 mg | Freq: Once | INTRAMUSCULAR | Status: AC
  Administered 2014-07-08: 2 mg via INTRAVENOUS
  Filled 2014-07-08: qty 1

## 2014-07-08 MED ORDER — ALBUTEROL SULFATE (2.5 MG/3ML) 0.083% IN NEBU: 2.5000 mg | INHALATION_SOLUTION | Freq: Four times a day (QID) | RESPIRATORY_TRACT | Status: DC | PRN

## 2014-07-08 MED ORDER — BUDESONIDE-FORMOTEROL FUMARATE 80-4.5 MCG/ACT IN AERO
2.0000 | INHALATION_SPRAY | Freq: Two times a day (BID) | RESPIRATORY_TRACT | Status: DC
  Administered 2014-07-08 – 2014-07-09 (×3): 2 via RESPIRATORY_TRACT
  Filled 2014-07-08: qty 6.9

## 2014-07-08 NOTE — Care Management Note (Signed)
Case Management Note  Patient Details  Name: [name] MRN: [id number] Date of Birth: Oct 18, 1965  Subjective/Objective:                   Closed nondisplaced left distal femur fracture nondisplaced impacted. Action/Plan: Discharge planning  Expected Discharge Date:  07/10/14               Expected Discharge Plan:  [place]  In-House Referral:     Discharge planning Services  CM Consult  Post Acute Care Choice:    Choice offered to:     DME Arranged:    DME Agency:     HH Arranged:    [place] Agency:     Status of Service:  Completed, signed off  Medicare Important Message Given:    Date Medicare IM Given:    Medicare IM give by:    Date Additional Medicare IM Given:    Additional Medicare Important Message give by:     If discussed at [place] of Stay Meetings, dates discussed:    Additional Comments: CM notes pt came from SNF and will be returning to SNF; CSW arranging. Dellie Catholic, RN 07/08/2014, 1:44 PM

## 2014-07-08 NOTE — Progress Notes (Signed)
Orthopedic Tech Progress Note Patient Details:  [name] September 25, 1965 [id number]  Ortho Devices Type of Ortho Device: Knee Immobilizer Ortho Device/Splint Interventions: Application   Cammer, Theodoro Parma 07/08/2014, 6:40 AM

## 2014-07-08 NOTE — ED Notes (Signed)
Patient requesting additional pain medication prior to leaving ER. Patient was advised she has pain medication ordered every hour and that it is not time for the next dose. Patient states "okay, that's fine".

## 2014-07-08 NOTE — Progress Notes (Signed)
49 year old lady admitted for a  Fall found to have a non displaced fracture of the femur.  She was admitted earlier this am  By Dr Tonita Cong and medicine consulted for medical issues.  She was seen by Dr Posey Pronto earlier.  Will continue to monitor and follow her while in the hospital.   Hosie Poisson, md [phone]

## 2014-07-08 NOTE — ED Notes (Signed)
Requested ortho tech to be paged for assistance with knee immobilizer vs splint of fracture. PA agreeable to holding pain medication until ortho tech arrives.

## 2014-07-08 NOTE — Progress Notes (Signed)
Nutrition Brief Note  RD consulted for diet education regarding diabetes.  Pt visibly upset and crying when RD entered pt's room. Pt states that she does not feel like having education at this time. Pt stated that she needs her pain medication. RD informed RN who was getting ready to go to the patient's room.  RD to attempt education at a later time.  Wt Readings from Last 15 Encounters:  07/08/14 243 lb (110.224 kg)  05/10/14 230 lb 9.6 oz (104.6 kg)  05/04/14 233 lb (105.688 kg)  12/23/13 223 lb (101.152 kg)  11/24/13 232 lb (105.235 kg)  06/08/13 250 lb (113.399 kg)  01/06/13 257 lb (116.574 kg)  07/25/12 266 lb 15.6 oz (121.1 kg)  07/03/12 265 lb (120.203 kg)  04/22/12 263 lb (119.296 kg)  04/09/12 247 lb (112.038 kg)  04/07/12 247 lb (112.038 kg)  02/13/12 252 lb 3.2 oz (114.397 kg)  12/28/11 246 lb (111.585 kg)  07/30/11 232 lb (105.235 kg)    Body mass index is 34.87 kg/(m^2). Patient meets criteria for obesity based on current BMI.   Current diet order is CHO modified, patient is consuming approximately 100% of meals at this time. Labs and medications reviewed.    If other nutrition issues arise, please consult RD.   Clayton Bibles, MS, RD, LDN Pager: [phone] After Hours Pager: [phone]

## 2014-07-08 NOTE — ED Notes (Signed)
Ortho tech at bedside 

## 2014-07-08 NOTE — Clinical Social Work Note (Signed)
Clinical Social Work Assessment  Patient Details  Name: [name] MRN: [id number] Date of Birth: 1965/07/30  Date of referral:  07/08/14               Reason for consult:  Discharge Planning, Facility Placement                Permission sought to share information with:    Permission granted to share information::     Name::        Agency::     Relationship::     Contact Information:     Housing/Transportation Living arrangements for the past 2 months:  [place] of Information:  Patient Patient Interpreter Needed:  None Criminal Activity/Legal Involvement Pertinent to Current Situation/Hospitalization:  No - Comment as needed Significant Relationships:  Parents Lives with:  Parents Do you feel safe going back to the place where you live?  Yes Need for family participation in patient care:  No (Coment)  Care giving concerns:  Pt fell at SNF.   Social Worker assessment / plan:  Pt hospitalized on 07/08/14 with a fracture of the distal femur. Pt reports she was transferring from chair and twisted her knee and fell. Pt had been admitted to [facility] on 05/11/14, for ST Rehab, with a LOG from [facility] while waiting for insurance authorization. CSW met with pt to assist with d/c planning. Pt would like to return to SNF at d/c. " I was just a couple weeks away from d/c when I fell ".  PN reviewed. Surgery is not required. SNF contacted and is willing to readmit when stable for d/c pending insurance authorization. Pt has [employer]. Pt is presently NWB. NSG will check to see if PT will be consulted. Pt also has medicaid listed on demo sheet. Will check to see if this is active. Employment status:  Unemployed Nurse, adult, Medicaid In [place] PT Recommendations:  Not assessed at this time Information / Referral to community resources:  [place]  Patient/Family's Response to care:  Pt would like to continue with rehab at  [facility].  Patient/Family's Understanding of and Emotional Response to Diagnosis, Current Treatment, and Prognosis:  Pt is frustrated and disappointed that she fell so close to going home. She is looking forward to feeling better and resuming rehab.  Emotional Assessment Appearance:  Appears stated age Attitude/Demeanor/Rapport:  Other (cooperative) Affect (typically observed):  Frustrated Orientation:  Oriented to Self, Oriented to Place, Oriented to  Time, Oriented to Situation Alcohol / Substance use:  Never Used Psych involvement (Current and /or in the community):  No (Comment)  Discharge Needs  Concerns to be addressed:  Discharge Planning Concerns Readmission within the last 30 days:  No Current discharge risk:  None Barriers to Discharge:  No Barriers Identified   Luretha Rued, [place] 07/08/2014, 12:04 PM

## 2014-07-08 NOTE — H&P (Signed)
[facility] Initial Consult Note   TERRYLYNN SHUGARTS  [id number]  DOB: 05-05-1965  DOA: 07/07/2014 DOS: the patient was seen and examined on 07/08/2014  PCP: Aura Dials, PA-C   Referring physician: Dr Ardyth Gal Reason for consult: medical management  HPI: CAYCE TANNA is a 49 y.o. female with Past medical history of peripheral neuropathy, anxiety, depression, HIV, asthma, diabetes mellitus type 2 not controlled. Patient is coming from SNF. The patient is presenting with complaints of fall. She was trying to get to her wheelchair and she slipped on the ground and fell on the left side. She also had some head injury but denies any headache or any neck pain or vision changes or speech difficulty. She denies any chest pain or abdominal pain. No focal deficit in the upper extremities. She has chronic neuropathy on her legs and has numbness on her legs. She has been recovering with Cooley's fracture surgery on the left arm as well as left hip arthroplasty. She denies any new medication. She denies missing any new medication or fever or chills or diarrhea or constipation or burning urination.  Review of Systems: as mentioned in the history of present illness.  A Comprehensive review of the other systems is negative.  Past Medical History  Diagnosis Date  . Anxiety   . Depression   . Hypertension   . HIV infection 2000  . Asthma     HOSPITALIZED WITH EXCERBATION OF ASTHMA - AND BRONCHITIS AND THE FLU DEC 2013  . Shortness of breath     ALLERGIES ARE "ACTING UP"  . Diabetes mellitus     ON INSULIN AND ORAL MEDICATIONS  . GERD (gastroesophageal reflux disease)   . Neuropathy     NEUROPATHY HANDS AND FEET  . Arthritis     LEFT HIP  . Pain     SEVERE PAIN LEFT HIP - HX OF LEFT HIP PINNING JULY 2013   Past Surgical History  Procedure Laterality Date  . Hip pinning,cannulated  07/16/2011    Procedure: CANNULATED HIP PINNING;  Surgeon: Gearlean Alf, MD;   Location: [facility];  Service: Orthopedics;  Laterality: Left;  . Hip pinning,cannulated Left 04/09/2012    Procedure: CANNULATED HIP PINNING AND HARDWARE REVISION;  Surgeon: Gearlean Alf, MD;  Location: [facility];  Service: Orthopedics;  Laterality: Left;  . Hip arthroplasty Left 05/05/2014    Procedure: ARTHROPLASTY OPEN REDUCTION INTERNAL FIXATION LEFT HIP AND REMOVAL OF TWO CANNULATED SCREW;  Surgeon: Latanya Maudlin, MD;  Location: [facility];  Service: Orthopedics;  Laterality: Left;  . Cast application Left [id number]    Procedure: CAST APPLICATION (FIBERGLASS);  Surgeon: Latanya Maudlin, MD;  Location: [facility];  Service: Orthopedics;  Laterality: Left;  CLOSED REDUCTION OF LEFT COLLES FRACTURE WITH SHORT ARM CAST  . Hardware removal Left 05/05/2014    Procedure: HARDWARE REMOVAL LEFT HIP;  Surgeon: Latanya Maudlin, MD;  Location: [facility];  Service: Orthopedics;  Laterality: Left;  REMOVAL BIOMET 6.5-8.0 CANNULATED SCREW   Social History:  reports that she has been smoking E-cigarettes.  She has a 17.5 pack-year smoking history. She has never used smokeless tobacco. She reports that she does not drink alcohol or use illicit drugs.  Allergies  Allergen Reactions  . Cortizone-10 [Hydrocortisone] Rash    Per patient, given local injection at knee and developed rash at local site.     Family History  Problem Relation Age of Onset  . Diabetes Mother   . Hypertension Mother   .  Vision loss Mother   . Hypertension Father     Prior to Admission medications   Medication Sig Start Date End Date Taking? Authorizing Provider  albuterol (PROVENTIL) (2.5 MG/3ML) 0.083% nebulizer solution Take 2.5 mg by nebulization every 6 (six) hours as needed for wheezing. For wheezing or shortness of breath 01/01/12  Yes Hosie Poisson, MD  ARIPiprazole (ABILIFY) 2 MG tablet Take 2 mg by mouth daily. Mental health   Yes Historical Provider, MD  aspirin EC 325 MG tablet Take 325 mg by mouth 2 (two) times daily. Prophylaxis.   Yes  Historical Provider, MD  atenolol (TENORMIN) 25 MG tablet Take 25 mg by mouth every morning.    Yes Historical Provider, MD  ATRIPLA 600-200-300 MG per tablet TAKE 1 TABLET BY MOUTH EVERY NIGHT AT BEDTIME Patient taking differently: Take 1 tablet by mouth every night at bedtime. 05/17/14  Yes Campbell Riches, MD  budesonide-formoterol [facility] 80-4.5 MCG/ACT inhaler Inhale 2 puffs into the lungs 2 (two) times daily. 01/01/12  Yes Hosie Poisson, MD  Cholecalciferol (VITAMIN D3) 5000 UNITS TABS Take 1 tablet by mouth daily.   Yes Historical Provider, MD  clonazePAM (KLONOPIN) 0.5 MG tablet Take 0.5 mg by mouth 4 (four) times daily.   Yes Historical Provider, MD  escitalopram (LEXAPRO) 20 MG tablet Take 20 mg by mouth daily.   Yes Historical Provider, MD  eszopiclone (LUNESTA) 2 MG TABS tablet Take 2 mg by mouth at bedtime. Take immediately before bedtime   Yes Historical Provider, MD  feeding supplement, GLUCERNA SHAKE, (GLUCERNA SHAKE) LIQD Take 237 mLs by mouth 3 (three) times daily between meals. 05/11/14  Yes Velvet Bathe, MD  fenofibrate micronized (LOFIBRA) 200 MG capsule Take 200 mg by mouth daily before breakfast.   Yes Historical Provider, MD  insulin aspart (NOVOLOG) 100 UNIT/ML injection Inject 0-10 Units into the skin 2 (two) times daily. Per sliding scale:  151-200 =  2 201-250 =  3 251-300 =  4 301-350 =  6 351-400 =  8 401+      = 10 units and call provider.   Yes Historical Provider, MD  insulin glargine (LANTUS) 100 UNIT/ML injection Inject 0.35 mLs (35 Units total) into the skin daily. Patient taking differently: Inject 50 Units into the skin daily.  05/11/14  Yes Velvet Bathe, MD  methocarbamol (ROBAXIN) 500 MG tablet Take 500 mg by mouth at bedtime.   Yes Historical Provider, MD  Multiple Vitamins-Minerals (MULTIVITAMIN WITH MINERALS) tablet Take 1 tablet by mouth daily.   Yes Historical Provider, MD  oxyCODONE (OXY IR/ROXICODONE) 5 MG immediate release tablet Take 1-3  tablets (5-15 mg total) by mouth every 4 (four) hours as needed for moderate pain or severe pain. Patient taking differently: Take 5-15 mg by mouth every 4 (four) hours as needed for moderate pain or severe pain. 5 mg for mild pain, 10 mg for moderate pain, 15 mg for severe pain. 05/09/14  Yes Amber Cecilio Asper, PA-C  OxyCODONE (OXYCONTIN) 10 mg T12A 12 hr tablet Take 10 mg by mouth 2 (two) times daily. Left hip pain.   Yes Historical Provider, MD  pantoprazole (PROTONIX) 40 MG tablet Take 40 mg by mouth every morning.  07/13/10  Yes Lyndee Hensen, NP  pregabalin (LYRICA) 225 MG capsule Take 225 mg by mouth 2 (two) times daily.   Yes Historical Provider, MD  vitamin E (VITAMIN E) 1000 UNIT capsule Take 1,000 Units by mouth daily.    Yes Historical Provider, MD  atorvastatin (LIPITOR) 40 MG tablet Take 40 mg by mouth daily.    Historical Provider, MD  enoxaparin (LOVENOX) 40 MG/0.4ML injection Inject 0.4 mLs (40 mg total) into the skin daily. Patient not taking: Reported on 07/07/2014 05/09/14   Ardeen Jourdain, PA-C  fenofibrate (TRICOR) 145 MG tablet Take 1 tablet (145 mg total) by mouth daily. 01/06/13   Campbell Riches, MD    Physical Exam: Filed Vitals:   07/08/14 0000 07/08/14 0117 07/08/14 0200 07/08/14 0216  BP: 133/62 135/65 146/71 143/69  Pulse: 85 87 89 90  Temp:  98.8 F (37.1 C)  98.3 F (36.8 C)  TempSrc:  Oral  Oral  Resp:  20  10  Height:    5\' 10"  (1.778 m)  Weight:    110.224 kg (243 lb)  SpO2: 93% 94% 93% 96%    General: Alert, Awake and Oriented to Time, Place and Person. Appear in mild distress Eyes: PERRL ENT: Oral Mucosa clear, moist. Neck: no JVD, no Carotid Bruits  Cardiovascular: S1 and S2 Present, no Murmur, Peripheral Pulses Present Respiratory: Bilateral Air entry equal and Decreased,  Clear to Auscultation,  no Crackles,no wheezes Abdomen: Bowel Sound Present, Soft and Non tender Skin: no Rash Extremities: trace Pedal edema, no calf  tenderness Neurologic: Grossly Unremarkable.  Labs:  CBC:  Recent Labs Lab 07/08/14 0027  WBC 10.7*  NEUTROABS 8.3*  HGB 9.9*  HCT 31.8*  MCV 91.9  PLT [id number]   Basic Metabolic Panel:  Recent Labs Lab 07/08/14 0027  NA 139  K 3.7  CL 107  CO2 24  GLUCOSE 172*  BUN 28*  CREATININE 1.97*  CALCIUM 8.5*   Radiological Exams: Dg Knee Complete 4 Views Left  07/07/2014   CLINICAL DATA:  Fall with left knee pain and swelling. Initial encounter.  EXAM: LEFT KNEE - COMPLETE 4+ VIEW  COMPARISON:  None.  FINDINGS: There is a medial supracondylar fracture with mild impaction. The fracture could extend to 1 of the femoral condyles based on the lateral projection, suggest CT. No evidence articular surface offset. There is a large knee joint effusion with probable lipomatous component. Marked osteopenia.  IMPRESSION: Distal femur fracture, as above.   Electronically Signed   By: Monte Fantasia M.D.   On: 07/07/2014 22:48    Assessment/Plan Principal Problem:   Fracture of distal femur Active Problems:   Human immunodeficiency virus (HIV) disease   Diabetes mellitus type 2 with complications, uncontrolled   Repeated falls   Colles' fracture of left radius   Osteoporosis    1. Fracture of the distal femur. As per my discussion with ER physician, orthopedic is planning to manage the patient conservatively with immobilization and pain management. She is requiring significant amount of Dilaudid every hour to control her pain. With this I would increase her basal oxycodone 12 hour tablet from 10 mg to 15 mg every 12 hours. Along with that I will use when necessary Dilaudid 0.5 mg every hour. We will check A screening EKG chest x-ray. Management per orthopedics. Preoperative evaluation should be provided if orthopedics decide to take the patient to or.  2. Diabetes mellitus type 2 with complication and uncontrolled. Diabetic nephropathy. Diabetic neuropathy. Continuing Levemir 35  units. Also placing her on sliding scale. Check hemoglobin A1c. Continuing educations for neuropathy. Chronic kidney disease is stable continue close monitoring and avoid nephrotoxic medications. Gentle hydration.  3. History of HIV. Patient is in [place] We will continue the same at present.  4.  Repeated falls. Osteoporosis. Currently I'm doing preliminary workup with vitamin D levels but she may require more definitive workup secondary to recurrent falls as well as recurrent fractures.  Thank you very much for involving [place] in care of your patient.  We will continue to follow the patient.   Author: Berle Mull, MD [facility] Pager: [phone] 07/08/2014     If 7PM-7AM, please contact night-coverage [email or web address] Password [email or web address]

## 2014-07-08 NOTE — H&P (Signed)
[name] is an 49 y.o. female.   Chief Complaint: Left knee pain HPI: S/P hemi transfering from chair twisted knee and fell. C/O pain.  Past Medical History  Diagnosis Date  . Anxiety   . Depression   . Hypertension   . HIV infection 2000  . Asthma     HOSPITALIZED WITH EXCERBATION OF ASTHMA - AND BRONCHITIS AND THE FLU DEC 2013  . Shortness of breath     ALLERGIES ARE "ACTING UP"  . Diabetes mellitus     ON INSULIN AND ORAL MEDICATIONS  . GERD (gastroesophageal reflux disease)   . Neuropathy     NEUROPATHY HANDS AND FEET  . Arthritis     LEFT HIP  . Pain     SEVERE PAIN LEFT HIP - HX OF LEFT HIP PINNING JULY 2013    Past Surgical History  Procedure Laterality Date  . Hip pinning,cannulated  07/16/2011    Procedure: CANNULATED HIP PINNING;  Surgeon: Gearlean Alf, MD;  Location: [facility];  Service: Orthopedics;  Laterality: Left;  . Hip pinning,cannulated Left 04/09/2012    Procedure: CANNULATED HIP PINNING AND HARDWARE REVISION;  Surgeon: Gearlean Alf, MD;  Location: [facility];  Service: Orthopedics;  Laterality: Left;  . Hip arthroplasty Left 05/05/2014    Procedure: ARTHROPLASTY OPEN REDUCTION INTERNAL FIXATION LEFT HIP AND REMOVAL OF TWO CANNULATED SCREW;  Surgeon: Latanya Maudlin, MD;  Location: [facility];  Service: Orthopedics;  Laterality: Left;  . Cast application Left 05/04/6501    Procedure: CAST APPLICATION (FIBERGLASS);  Surgeon: Latanya Maudlin, MD;  Location: [facility];  Service: Orthopedics;  Laterality: Left;  CLOSED REDUCTION OF LEFT COLLES FRACTURE WITH SHORT ARM CAST  . Hardware removal Left 05/05/2014    Procedure: HARDWARE REMOVAL LEFT HIP;  Surgeon: Latanya Maudlin, MD;  Location: [facility];  Service: Orthopedics;  Laterality: Left;  REMOVAL BIOMET 6.5-8.0 CANNULATED SCREW    Family History  Problem Relation Age of Onset  . Diabetes Mother   . Hypertension Mother   . Vision loss Mother   . Hypertension Father    Social History:  reports that she has been  smoking E-cigarettes.  She has a 17.5 pack-year smoking history. She has never used smokeless tobacco. She reports that she does not drink alcohol or use illicit drugs.  Allergies:  Allergies  Allergen Reactions  . Cortizone-10 [Hydrocortisone] Rash    Per patient, given local injection at knee and developed rash at local site.     Medications Prior to Admission  Medication Sig Dispense Refill  . albuterol (PROVENTIL) (2.5 MG/3ML) 0.083% nebulizer solution Take 2.5 mg by nebulization every 6 (six) hours as needed for wheezing. For wheezing or shortness of breath    . ARIPiprazole (ABILIFY) 2 MG tablet Take 2 mg by mouth daily. Mental health    . aspirin EC 325 MG tablet Take 325 mg by mouth 2 (two) times daily. Prophylaxis.    [name] Kitchen atenolol (TENORMIN) 25 MG tablet Take 25 mg by mouth every morning.     . ATRIPLA 600-200-300 MG per tablet TAKE 1 TABLET BY MOUTH EVERY NIGHT AT BEDTIME (Patient taking differently: Take 1 tablet by mouth every night at bedtime.) 30 tablet 1  . budesonide-formoterol (SYMBICORT) 80-4.5 MCG/ACT inhaler Inhale 2 puffs into the lungs 2 (two) times daily.    . Cholecalciferol (VITAMIN D3) 5000 UNITS TABS Take 1 tablet by mouth daily.    . clonazePAM (KLONOPIN) 0.5 MG tablet Take 0.5 mg by mouth 4 (  four) times daily.    [name] Kitchen escitalopram (LEXAPRO) 20 MG tablet Take 20 mg by mouth daily.    . eszopiclone (LUNESTA) 2 MG TABS tablet Take 2 mg by mouth at bedtime. Take immediately before bedtime    . feeding supplement, GLUCERNA SHAKE, (GLUCERNA SHAKE) LIQD Take 237 mLs by mouth 3 (three) times daily between meals.  0  . fenofibrate micronized (LOFIBRA) 200 MG capsule Take 200 mg by mouth daily before breakfast.    . insulin aspart (NOVOLOG) 100 UNIT/ML injection Inject 0-10 Units into the skin 2 (two) times daily. Per sliding scale:  151-200 =  2 201-250 =  3 251-300 =  4 301-350 =  6 351-400 =  8 401+      = 10 units and call provider.    . insulin glargine (LANTUS) 100  UNIT/ML injection Inject 0.35 mLs (35 Units total) into the skin daily. (Patient taking differently: Inject 50 Units into the skin daily. ) 10 mL 11  . methocarbamol (ROBAXIN) 500 MG tablet Take 500 mg by mouth at bedtime.    . Multiple Vitamins-Minerals (MULTIVITAMIN WITH MINERALS) tablet Take 1 tablet by mouth daily.    [name] Kitchen oxyCODONE (OXY IR/ROXICODONE) 5 MG immediate release tablet Take 1-3 tablets (5-15 mg total) by mouth every 4 (four) hours as needed for moderate pain or severe pain. (Patient taking differently: Take 5-15 mg by mouth every 4 (four) hours as needed for moderate pain or severe pain. 5 mg for mild pain, 10 mg for moderate pain, 15 mg for severe pain.) 60 tablet 0  . OxyCODONE (OXYCONTIN) 10 mg T12A 12 hr tablet Take 10 mg by mouth 2 (two) times daily. Left hip pain.    . pantoprazole (PROTONIX) 40 MG tablet Take 40 mg by mouth every morning.     . pregabalin (LYRICA) 225 MG capsule Take 225 mg by mouth 2 (two) times daily.    . vitamin E (VITAMIN E) 1000 UNIT capsule Take 1,000 Units by mouth daily.     [name] Kitchen atorvastatin (LIPITOR) 40 MG tablet Take 40 mg by mouth daily.    [name] Kitchen enoxaparin (LOVENOX) 40 MG/0.4ML injection Inject 0.4 mLs (40 mg total) into the skin daily. (Patient not taking: Reported on 07/07/2014) 10 Syringe 0  . fenofibrate (TRICOR) 145 MG tablet Take 1 tablet (145 mg total) by mouth daily. 90 tablet 3    Results for orders placed or performed during the hospital encounter of 07/07/14 (from the past 48 hour(s))  CBC with Differential/Platelet     Status: Abnormal   Collection Time: 07/08/14 12:27 AM  Result Value Ref Range   WBC 10.7 (H) 4.0 - 10.5 K/uL   RBC 3.46 (L) 3.87 - 5.11 MIL/uL   Hemoglobin 9.9 (L) 12.0 - 15.0 g/dL   HCT 31.8 (L) 36.0 - 46.0 %   MCV 91.9 78.0 - 100.0 fL   MCH 28.6 26.0 - 34.0 pg   MCHC 31.1 30.0 - 36.0 g/dL   RDW 16.0 (H) 11.5 - 15.5 %   Platelets 363 150 - 400 K/uL   Neutrophils Relative % 77 43 - 77 %   Neutro Abs 8.3 (H) 1.7 - 7.7  K/uL   Lymphocytes Relative 14 12 - 46 %   Lymphs Abs 1.5 0.7 - 4.0 K/uL   Monocytes Relative 6 3 - 12 %   Monocytes Absolute 0.6 0.1 - 1.0 K/uL   Eosinophils Relative 2 0 - 5 %   Eosinophils Absolute 0.2 0.0 - 0.7  K/uL   Basophils Relative 1 0 - 1 %   Basophils Absolute 0.1 0.0 - 0.1 K/uL  Basic metabolic panel     Status: Abnormal   Collection Time: 07/08/14 12:27 AM  Result Value Ref Range   Sodium 139 135 - 145 mmol/L   Potassium 3.7 3.5 - 5.1 mmol/L   Chloride 107 101 - 111 mmol/L   CO2 24 22 - 32 mmol/L   Glucose, Bld 172 (H) 65 - 99 mg/dL   BUN 28 (H) 6 - 20 mg/dL   Creatinine, Ser 1.97 (H) 0.44 - 1.00 mg/dL   Calcium 8.5 (L) 8.9 - 10.3 mg/dL   GFR calc non Af Amer 28 (L) >60 mL/min   GFR calc Af Amer 33 (L) >60 mL/min    Comment: (NOTE) The eGFR has been calculated using the CKD EPI equation. This calculation has not been validated in all clinical situations. eGFR's persistently <60 mL/min signify possible Chronic Kidney Disease.    Anion gap 8 5 - 15  Type and screen     Status: None   Collection Time: 07/08/14  4:20 AM  Result Value Ref Range   ABO/RH(D) O NEG    Antibody Screen NEG    Sample Expiration 07/11/2014   Comprehensive metabolic panel     Status: Abnormal   Collection Time: 07/08/14  4:20 AM  Result Value Ref Range   Sodium 138 135 - 145 mmol/L   Potassium 3.6 3.5 - 5.1 mmol/L   Chloride 106 101 - 111 mmol/L   CO2 25 22 - 32 mmol/L   Glucose, Bld 140 (H) 65 - 99 mg/dL   BUN 28 (H) 6 - 20 mg/dL   Creatinine, Ser 1.96 (H) 0.44 - 1.00 mg/dL   Calcium 8.5 (L) 8.9 - 10.3 mg/dL   Total Protein 6.6 6.5 - 8.1 g/dL   Albumin 3.1 (L) 3.5 - 5.0 g/dL   AST 16 15 - 41 U/L   ALT 10 (L) 14 - 54 U/L   Alkaline Phosphatase 266 (H) 38 - 126 U/L   Total Bilirubin 0.4 0.3 - 1.2 mg/dL   GFR calc non Af Amer 29 (L) >60 mL/min   GFR calc Af Amer 33 (L) >60 mL/min    Comment: (NOTE) The eGFR has been calculated using the CKD EPI equation. This calculation has not  been validated in all clinical situations. eGFR's persistently <60 mL/min signify possible Chronic Kidney Disease.    Anion gap 7 5 - 15  CBC WITH DIFFERENTIAL     Status: Abnormal   Collection Time: 07/08/14  4:20 AM  Result Value Ref Range   WBC 10.5 4.0 - 10.5 K/uL   RBC 3.59 (L) 3.87 - 5.11 MIL/uL   Hemoglobin 9.9 (L) 12.0 - 15.0 g/dL   HCT 33.0 (L) 36.0 - 46.0 %   MCV 91.9 78.0 - 100.0 fL   MCH 27.6 26.0 - 34.0 pg   MCHC 30.0 30.0 - 36.0 g/dL   RDW 15.9 (H) 11.5 - 15.5 %   Platelets 354 150 - 400 K/uL   Neutrophils Relative % 73 43 - 77 %   Neutro Abs 7.7 1.7 - 7.7 K/uL   Lymphocytes Relative 17 12 - 46 %   Lymphs Abs 1.8 0.7 - 4.0 K/uL   Monocytes Relative 6 3 - 12 %   Monocytes Absolute 0.6 0.1 - 1.0 K/uL   Eosinophils Relative 3 0 - 5 %   Eosinophils Absolute 0.3 0.0 - 0.7 K/uL  Basophils Relative 1 0 - 1 %   Basophils Absolute 0.1 0.0 - 0.1 K/uL  Protime-INR     Status: None   Collection Time: 07/08/14  4:20 AM  Result Value Ref Range   Prothrombin Time 14.5 11.6 - 15.2 seconds   INR 1.11 0.00 - 1.49   Dg Knee Complete 4 Views Left  07/07/2014   CLINICAL DATA:  Fall with left knee pain and swelling. Initial encounter.  EXAM: LEFT KNEE - COMPLETE 4+ VIEW  COMPARISON:  None.  FINDINGS: There is a medial supracondylar fracture with mild impaction. The fracture could extend to 1 of the femoral condyles based on the lateral projection, suggest CT. No evidence articular surface offset. There is a large knee joint effusion with probable lipomatous component. Marked osteopenia.  IMPRESSION: Distal femur fracture, as above.   Electronically Signed   By: Monte Fantasia M.D.   On: 07/07/2014 22:48    Review of Systems  Musculoskeletal: Positive for joint pain.  All other systems reviewed and are negative.   Blood pressure 129/59, pulse 84, temperature 99.3 F (37.4 C), temperature source Oral, resp. rate 14, height 5' 10"  (1.778 m), weight 110.224 kg (243 lb), last menstrual  period 11/16/2010, SpO2 95 %. Physical Exam  Nursing note and vitals reviewed. Constitutional: She is oriented to person, place, and time. She appears well-developed.  HENT:  Head: Normocephalic.  Eyes: Pupils are equal, round, and reactive to light.  Neck: Normal range of motion.  Cardiovascular: Normal rate.   Respiratory: Effort normal.  GI: Soft.  Musculoskeletal: She exhibits edema and tenderness.  Left knee effusion. Skin intact. Compartments soft. Neurovascularly intact. No DVT.  Neurological: She is alert and oriented to person, place, and time.  Skin: Skin is warm and dry.  Psychiatric: She has a normal mood and affect.     Assessment/Plan Closed nondisplaced left distal femur fracture nondisplaced impacted. IDDM  HIV  CRF Plan nonoperative treatment. NWB Ice   Josaphine Shimamoto C 07/08/2014, 7:34 AM

## 2014-07-09 LAB — GLUCOSE, CAPILLARY
Glucose-Capillary: 112 mg/dL — ABNORMAL HIGH (ref 65–99)
Glucose-Capillary: 159 mg/dL — ABNORMAL HIGH (ref 65–99)

## 2014-07-09 LAB — HEMOGLOBIN A1C
HEMOGLOBIN A1C: 8.4 % — AB (ref 4.8–5.6)
MEAN PLASMA GLUCOSE: 194 mg/dL

## 2014-07-09 LAB — CREATININE, SERUM
CREATININE: 1.99 mg/dL — AB (ref 0.44–1.00)
GFR calc non Af Amer: 28 mL/min — ABNORMAL LOW (ref >60)
GFR, EST AFRICAN AMERICAN: 33 mL/min — AB (ref >60)

## 2014-07-09 LAB — VITAMIN D 25 HYDROXY (VIT D DEFICIENCY, FRACTURES): VIT D 25 HYDROXY: 30.5 ng/mL (ref 30.0–100.0)

## 2014-07-09 MED ORDER — METHOCARBAMOL 500 MG PO TABS: 500.0000 mg | ORAL_TABLET | Freq: Four times a day (QID) | ORAL | Status: DC | PRN

## 2014-07-09 MED ORDER — OXYCODONE HCL 5 MG PO TABS: 5.0000 mg | ORAL_TABLET | ORAL | Status: DC | PRN

## 2014-07-09 MED ORDER — DOCUSATE SODIUM 100 MG PO CAPS: 100.0000 mg | ORAL_CAPSULE | Freq: Two times a day (BID) | ORAL | Status: DC

## 2014-07-09 NOTE — Discharge Summary (Signed)
Patient ID: [name] MRN: [id number] DOB/AGE: [birth year] 50 y.o.  Admit date: 07/07/2014 Discharge date: 07/09/2014  Admission Diagnoses:  Principal Problem:   Fracture of distal femur Active Problems:   Human immunodeficiency virus (HIV) disease   Diabetes mellitus type 2 with complications, uncontrolled   Repeated falls   Colles' fracture of left radius   Osteoporosis   Femur fracture, left   Discharge Diagnoses:  Same  Past Medical History  Diagnosis Date  . Anxiety   . Depression   . Hypertension   . HIV infection 2000  . Asthma     HOSPITALIZED WITH EXCERBATION OF ASTHMA - AND BRONCHITIS AND THE FLU DEC 2013  . Shortness of breath     ALLERGIES ARE "ACTING UP"  . Diabetes mellitus     ON INSULIN AND ORAL MEDICATIONS  . GERD (gastroesophageal reflux disease)   . Neuropathy     NEUROPATHY HANDS AND FEET  . Arthritis     LEFT HIP  . Pain     SEVERE PAIN LEFT HIP - HX OF LEFT HIP PINNING JULY 2013     Consultants: Treatment Team:  Susa Day, MD  Discharged Condition: Improved  Hospital Course: [name] is an 49 y.o. female who was admitted 07/07/2014 for nonoperative treatment ofFracture of distal femur left. Patient has severe unremitting pain that affects sleep, daily activities, and work/hobbies. She is 2 months s/p L hip hemiarthroplasty by Dr. Gladstone Lighter and has been in rehab following that. She participated in PT, PWB 25-50% LLE per Dr. Gladstone Lighter with knee immobilizer in place. Will follow up as an outpatient.  Patient was given perioperative antibiotics: Anti-infectives    Start     Dose/Rate Route Frequency Ordered Stop   07/08/14 2200  efavirenz-emtricitabine-tenofovir (ATRIPLA) [id number] MG per tablet 1 tablet     1 tablet Oral Daily at bedtime 07/08/14 0246         Patient was given sequential compression devices, early ambulation, and chemoprophylaxis to prevent DVT.  Patient benefited maximally from hospital stay and there were no  complications.    Recent vital signs: Patient Vitals for the past 24 hrs:  BP Temp Temp src Pulse Resp SpO2  07/09/14 0939 (!) 100/30 mmHg 97.6 F (36.4 C) Oral 74 16 97 %  07/09/14 0924 (!) 115/53 mmHg - - 69 - -  07/09/14 0547 (!) 104/55 mmHg 98.6 F (37 C) Oral 63 16 96 %  07/09/14 0200 (!) 115/55 mmHg 98.5 F (36.9 C) Oral 68 16 96 %  07/08/14 2200 (!) 103/51 mmHg 99.1 F (37.3 C) Oral 74 16 95 %  07/08/14 1920 - - - - - 92 %  07/08/14 1505 (!) 107/50 mmHg 98.8 F (37.1 C) Oral 77 16 92 %  07/08/14 1047 (!) 105/56 mmHg 97.8 F (36.6 C) Oral 74 16 97 %     Recent laboratory studies:  Recent Labs  07/08/14 0027 07/08/14 0420 07/09/14 0451  WBC 10.7* 10.5  --   HGB 9.9* 9.9*  --   HCT 31.8* 33.0*  --   PLT 363 354  --   NA 139 138  --   K 3.7 3.6  --   CL 107 106  --   CO2 24 25  --   BUN 28* 28*  --   CREATININE 1.97* 1.96* 1.99*  GLUCOSE 172* 140*  --   INR  --  1.11  --   CALCIUM 8.5* 8.5*  --  Discharge Medications:     Medication List    STOP taking these medications        enoxaparin 40 MG/0.4ML injection  Commonly known as:  LOVENOX      TAKE these medications        albuterol (2.5 MG/3ML) 0.083% nebulizer solution  Commonly known as:  PROVENTIL  Take 2.5 mg by nebulization every 6 (six) hours as needed for wheezing. For wheezing or shortness of breath     ARIPiprazole 2 MG tablet  Commonly known as:  ABILIFY  Take 2 mg by mouth daily. Mental health     aspirin EC 325 MG tablet  Take 325 mg by mouth 2 (two) times daily. Prophylaxis.     atenolol 25 MG tablet  Commonly known as:  TENORMIN  Take 25 mg by mouth every morning.     atorvastatin 40 MG tablet  Commonly known as:  LIPITOR  Take 40 mg by mouth daily.     ATRIPLA 600-200-300 MG per tablet  Generic drug:  efavirenz-emtricitabine-tenofovir  TAKE 1 TABLET BY MOUTH EVERY NIGHT AT BEDTIME     budesonide-formoterol 80-4.5 MCG/ACT inhaler  Commonly known as:  SYMBICORT  Inhale  2 puffs into the lungs 2 (two) times daily.     clonazePAM 0.5 MG tablet  Commonly known as:  KLONOPIN  Take 0.5 mg by mouth 4 (four) times daily.     docusate sodium 100 MG capsule  Commonly known as:  COLACE  Take 1 capsule (100 mg total) by mouth 2 (two) times daily.     escitalopram 20 MG tablet  Commonly known as:  LEXAPRO  Take 20 mg by mouth daily.     eszopiclone 2 MG Tabs tablet  Commonly known as:  LUNESTA  Take 2 mg by mouth at bedtime. Take immediately before bedtime     feeding supplement (GLUCERNA SHAKE) Liqd  Take 237 mLs by mouth 3 (three) times daily between meals.     fenofibrate 145 MG tablet  Commonly known as:  TRICOR  Take 1 tablet (145 mg total) by mouth daily.     fenofibrate micronized 200 MG capsule  Commonly known as:  LOFIBRA  Take 200 mg by mouth daily before breakfast.     insulin aspart 100 UNIT/ML injection  Commonly known as:  novoLOG  Inject 0-10 Units into the skin 2 (two) times daily. Per sliding scale:  151-200 =  2 201-250 =  3 251-300 =  4 301-350 =  6 351-400 =  8 401+      = 10 units and call provider.     insulin glargine 100 UNIT/ML injection  Commonly known as:  LANTUS  Inject 0.35 mLs (35 Units total) into the skin daily.     methocarbamol 500 MG tablet  Commonly known as:  ROBAXIN  Take 1 tablet (500 mg total) by mouth every 6 (six) hours as needed for muscle spasms.     multivitamin with minerals tablet  Take 1 tablet by mouth daily.     OXYCONTIN 10 mg T12a 12 hr tablet  Generic drug:  OxyCODONE  Take 10 mg by mouth 2 (two) times daily. Left hip pain.     oxyCODONE 5 MG immediate release tablet  Commonly known as:  Oxy IR/ROXICODONE  Take 1-3 tablets (5-15 mg total) by mouth every 4 (four) hours as needed for moderate pain or severe pain. 5 mg for mild pain, 10 mg for moderate pain, 15 mg for severe  pain.     pantoprazole 40 MG tablet  Commonly known as:  PROTONIX  Take 40 mg by mouth every morning.     pregabalin  225 MG capsule  Commonly known as:  LYRICA  Take 225 mg by mouth 2 (two) times daily.     Vitamin D3 5000 UNITS Tabs  Take 1 tablet by mouth daily.     vitamin E 1000 UNIT capsule  Generic drug:  vitamin E  Take 1,000 Units by mouth daily.        Diagnostic Studies: Chest Portable 1 View  07/08/2014   CLINICAL DATA:  Hypertension.  Shortness of breath.  EXAM: PORTABLE CHEST - 1 VIEW  COMPARISON:  05/09/2014.  FINDINGS: Mediastinum hilar structures normal. Cardiomegaly with normal pulmonary vascularity. Mild left base subsegmental atelectasis. No pleural effusion or pneumothorax. Bilateral rib fractures again noted.  IMPRESSION: 1. Mild left base subsegmental atelectasis. 2. Stable cardiomegaly. 3. Bilateral rib fractures again noted.  No pneumothorax.   Electronically Signed   By: Marcello Moores  Register   On: 07/08/2014 07:38   Dg Knee Complete 4 Views Left  07/07/2014   CLINICAL DATA:  Fall with left knee pain and swelling. Initial encounter.  EXAM: LEFT KNEE - COMPLETE 4+ VIEW  COMPARISON:  None.  FINDINGS: There is a medial supracondylar fracture with mild impaction. The fracture could extend to 1 of the femoral condyles based on the lateral projection, suggest CT. No evidence articular surface offset. There is a large knee joint effusion with probable lipomatous component. Marked osteopenia.  IMPRESSION: Distal femur fracture, as above.   Electronically Signed   By: Monte Fantasia M.D.   On: 07/07/2014 22:48   Dg Hip Port Unilat With Pelvis 1v Left  07/08/2014   CLINICAL DATA:  49 year old female status post left hip surgery.  EXAM: LEFT HIP (WITH PELVIS) 1 VIEW PORTABLE  COMPARISON:  Radiograph dated 05/09/2014  FINDINGS: There is postsurgical changes of fixation of a left femoral intratrochanteric fracture. The fixation wires are intact. There is foreshortening of the left femoral neck similar to the prior study. No new fracture identified.  IMPRESSION: Stable appearance of the left femoral  intertrochanteric fracture status post fixation. No interval change.   Electronically Signed   By: Anner Crete M.D.   On: 07/08/2014 19:22    Disposition: 03-Skilled Nursing Facility      Discharge Instructions    Call MD / Call 911    Complete by:  As directed   If you experience chest pain or shortness of breath, CALL 911 and be transported to the hospital emergency room.  If you develope a fever above 101 F, pus (white drainage) or increased drainage or redness at the wound, or calf pain, call your surgeon's office.     Constipation Prevention    Complete by:  As directed   Drink plenty of fluids.  Prune juice may be helpful.  You may use a stool softener, such as Colace (over the counter) 100 mg twice a day.  Use MiraLax (over the counter) for constipation as needed.     Diet - low sodium heart healthy    Complete by:  As directed      Increase activity slowly as tolerated    Complete by:  As directed           Follow up with Dr. Gladstone Lighter in 2 weeks for repeat xrays    Signed: Cecilie Kicks. PA-C for Dr. Tonita Cong 07/09/2014, 10:37 AM

## 2014-07-09 NOTE — Discharge Instructions (Signed)
Remain partial weight bearing left leg, 25-50% body weight, while in knee immobilizer Ice and elevate L knee/leg to reduce swelling 5x/day 20 minutes each Follow hip precautions s/p hemiarthroplasty Take Aspring 325mg  daily to prevent blood clots Follow up with Dr. Gladstone Lighter in 2 weeks for repeat xrays

## 2014-07-09 NOTE — Progress Notes (Signed)
Subjective: L distal femur fx, nondisplaced, supracondylar medial  Pt reports pain in the L knee and leg. She is in her knee immobilizer. Seen by Dr. Gladstone Lighter and myself this morning. No other c/o.  Objective: Vital signs in last 24 hours: Temp:  [97.6 F (36.4 C)-99.1 F (37.3 C)] 97.6 F (36.4 C) (07/08 0939) Pulse Rate:  [63-77] 74 (07/08 0939) Resp:  [16] 16 (07/08 0939) BP: (100-115)/(30-56) 100/30 mmHg (07/08 0939) SpO2:  [92 %-97 %] 97 % (07/08 0939)  Intake/Output from previous day: 07/07 0701 - 07/08 0700 In: 600 [P.O.:600] Out: 2700 [Urine:2700] Intake/Output this shift: Total I/O In: -  Out: 800 [Urine:800]   Recent Labs  07/08/14 0027 07/08/14 0420  HGB 9.9* 9.9*    Recent Labs  07/08/14 0027 07/08/14 0420  WBC 10.7* 10.5  RBC 3.46* 3.59*  HCT 31.8* 33.0*  PLT 363 354    Recent Labs  07/08/14 0027 07/08/14 0420 07/09/14 0451  NA 139 138  --   K 3.7 3.6  --   CL 107 106  --   CO2 24 25  --   BUN 28* 28*  --   CREATININE 1.97* 1.96* 1.99*  GLUCOSE 172* 140*  --   CALCIUM 8.5* 8.5*  --     Recent Labs  07/08/14 0420  INR 1.11    Neurologically intact ABD soft Neurovascular intact Sensation intact distally Intact pulses distally Dorsiflexion/Plantar flexion intact No cellulitis present Compartment soft no sign of DVT  Knee immobilizer in place  Assessment/Plan: L nondisplaced distal femur fx  Discussed with Dr. Gladstone Lighter and Dr. Tonita Cong. She is 2 months s/p L hip hemi by Dr. Gladstone Lighter and he will be managing her care as an outpatient. Will allow her to partially weightbear LLE per Dr. Gladstone Lighter at 25-50% in the knee immobilizer Plan D/C to SNF today if able for continued PT, otherwise plan for tomorrow  PT to see later today for ambulating PWB Pt will follow up with Dr. Gladstone Lighter in office in 2 weeks for repeat xrays  [name] M. 07/09/2014, 10:25 AM

## 2014-07-09 NOTE — Progress Notes (Signed)
CSw met with pt to offer support and assistance with d/c planning. Pt has decided to return to [facility] at d/c. " [facility] has wonderful PT. They know me there.". Pt admits to getting frustrated when she calls for help and has to wait. " I guess that could happen anywhere. ". [facility] is working with [employer]. Pt has [employer]. CSW has asked SNF to readmit pt with her medicaid while waiting for [facility] authorization. SNF is considering this. Expecting d/c to SNF today.  Werner Lean LCSW [phone]

## 2014-07-09 NOTE — Progress Notes (Signed)
Pt will d/c back to [facility] today. Pt is in agreement with this plan. SNF has received authorization from [facility]. [facility]AR transport is required. Pt to notify family. NSG reviewed d/c summary, scripts, avs. Scripts included in d/c packet.  Werner Lean LCSW [phone]

## 2014-07-09 NOTE — Evaluation (Signed)
Physical Therapy Evaluation Patient Details Name: [name] MRN: [id number] DOB: 1965/08/03 Today's Date: 07/09/2014   History of Present Illness  Pt is 49 yo s/p fall at SNF sustained Impacted, nondisplaced distal femeur fracture. KI  on at all times. s/p fall 1 month ago and had L hip pain.  s/p ORIF L hip with removal of 2 previous cannulated screws and closed reduction/ to L wrist.  PMH:  HIV, IDDM, HTN  Clinical Impression  Patient is very participatory, really giving effort to mobilize and work through pain. Patient assisted to  [facility] of bed   With 2 person assist. Patient will require transfers only, may be able to utilize a sliding board. Patient will benefit from PT to address problems listed in note below.    Follow Up Recommendations SNF;Supervision/Assistance - 24 hour    Equipment Recommendations  None recommended by PT    Recommendations for Other Services       Precautions / Restrictions Precautions Precautions: Fall Required Braces or Orthoses: Knee Immobilizer - Left;Other Brace/Splint Knee Immobilizer - Left: On at all times Other Brace/Splint: L wrist Restrictions Weight Bearing Restrictions: Yes LLE Weight Bearing: Non weight bearing      Mobility  Bed Mobility Overal bed mobility: Needs Assistance Bed Mobility: Rolling;Supine to Sit;Sit to Sidelying Rolling: Mod assist   Supine to sit: +2 for safety/equipment;+2 for physical assistance;Mod assist   Sit to sidelying: +2 for physical assistance;+2 for safety/equipment;Mod assist General bed mobility comments: assistance to move L leg to edge and to floor then assist for both legs onto bed., HOB angled down, purple slide used under the buttocks to facilitate sliding along bed edge to move up in the bed prior to return to supine. Patient able to hook elbow on left and assist sliding up in the bed.   Transfers                    Ambulation/Gait                Stairs             Wheelchair Mobility    Modified Rankin (Stroke Patients Only)       Balance Overall balance assessment: History of Falls;Needs assistance Sitting-balance support: Feet supported;Bilateral upper extremity supported Sitting balance-Leahy Scale: Good Sitting balance - Comments: able to shift weight to  change pad under buttocks.                                     Pertinent Vitals/Pain Pain Assessment: 0-10 Pain Descriptors / Indicators: Crying;Discomfort;Grimacing;Heaviness;Jabbing Pain Intervention(s): Limited activity within patient's tolerance;Monitored during session;Patient requesting pain meds-RN notified;RN gave pain meds during session;Repositioned    Home Living Family/patient expects to be discharged to:: Skilled nursing facility                      Prior Function Level of Independence: Needs assistance   Gait / Transfers Assistance Needed: WC dependent, was beginning to ambulate w/ L platform with PT.           Hand Dominance        Extremity/Trunk Assessment   Upper Extremity Assessment: LUE deficits/detail;RUE deficits/detail RUE Deficits / Details: ablwe to functionally use for transfers and repositioning/   RUE Sensation: history of peripheral neuropathy     Lower Extremity Assessment: RLE deficits/detail;LLE deficits/detail   LLE Deficits /  Details: decreased active ankle dorsiflexion,in KI  Cervical / Trunk Assessment: Normal  Communication      Cognition Arousal/Alertness: Awake/alert Behavior During Therapy: WFL for tasks assessed/performed Overall Cognitive Status: Within Functional Limits for tasks assessed (emotional about current issues.)                      General Comments      Exercises        Assessment/Plan    PT Assessment Patient needs continued PT services  PT Diagnosis Generalized weakness;Acute pain   PT Problem List Decreased strength;Decreased activity tolerance;Decreased  mobility;Impaired sensation;Decreased knowledge of precautions;Decreased safety awareness;Decreased knowledge of use of DME;Pain  PT Treatment Interventions DME instruction;Functional mobility training;Therapeutic activities;Therapeutic exercise;Patient/family education   PT Goals (Current goals can be found in the Care Plan section) Acute Rehab PT Goals Patient Stated Goal: i want to go home and see my dog. PT Goal Formulation: With patient Time For Goal Achievement: 07/23/14 Potential to Achieve Goals: Good    Frequency Min 3X/week   Barriers to discharge        Co-evaluation               End of Session   Activity Tolerance: Patient tolerated treatment well Patient left: in bed;with call bell/phone within reach Nurse Communication: Mobility status;Patient requests pain meds         Time: 0849-0930 PT Time Calculation (min) (ACUTE ONLY): 41 min   Charges:   PT Evaluation $Initial PT Evaluation Tier I: 1 Procedure PT Treatments $Therapeutic Activity: 23-37 mins   PT G Codes:        Claretha Cooper 07/09/2014, 9:38 AM Tresa Endo PT [phone]

## 2014-07-09 NOTE — Progress Notes (Signed)
Physical Therapy Treatment Patient Details Name: [name] MRN: [id number] DOB: [birth year] Today's Date: 07/09/2014    History of Present Illness Pt is 49 yo s/p fall at SNF sustained Impacted, nondisplaced distal femeur fracture. KI  on at all times.s/p fall 1 month ago and had L hip pain.  s/p ORIF L hip with removal of 2 previous cannulated screws and closed reduction/casting to L wrist.  PMH:  HIV, IDDM, HTN    PT Comments    Patient  Emotional in room about returning to SNF. Patient encouraged on improved mobility today. Patient able to hop 3 steps with L platform. Support given at L knee  To support .  Follow Up Recommendations  SNF;Supervision/Assistance - 24 hour     Equipment Recommendations  None recommended by PT    Recommendations for Other Services       Precautions / Restrictions Precautions Precautions: Fall Required Braces or Orthoses: Knee Immobilizer - Left;Other Brace/Splint Knee Immobilizer - Left: On at all times Other Brace/Splint: L wrist Restrictions LLE Weight Bearing: Partial weight bearing LLE Partial Weight Bearing Percentage or Pounds: 25-50%    Mobility  Bed Mobility Overal bed mobility: Needs Assistance Bed Mobility: Supine to Sit;Sit to Sidelying Rolling: Mod assist   Supine to sit: +2 for safety/equipment;Mod assist   Sit to sidelying: +2 for physical assistance;+2 for safety/equipment;Mod assist General bed mobility comments: assistance to move L leg to edge and to floor then assist for both legs onto bed., patient  is able to utilize overhead trapeze to slide self up in bed with PT supporting L leg.  Transfers Overall transfer level: Needs assistance Equipment used: Left platform walker Transfers: Sit to/from Stand Sit to Stand: Mod assist;+2 physical assistance;+2 safety/equipment;From elevated surface         General transfer comment: bed raised to stand patient.  bed pulled up behind patient toand raised to patients  height to facilitate sitting.  Ambulation/Gait Ambulation/Gait assistance: Max assist;+2 physical assistance;+2 safety/equipment   Assistive device: Left platform walker Gait Pattern/deviations: Step-to pattern;Antalgic     General Gait Details: patient able to take 4 small hops forward. unable to hop backward, began to fatigue and became anxiuos, Bed pulled up behind Patient.   Stairs            Wheelchair Mobility    Modified Rankin (Stroke Patients Only)       Balance Overall balance assessment: History of Falls Sitting-balance support: Feet supported;Bilateral upper extremity supported Sitting balance-Leahy Scale: Good Sitting balance - Comments: able to shift weight to  change pad under buttocks.                            Cognition Arousal/Alertness: Awake/alert Behavior During Therapy:  (tearful about eminent DC to SNF) Overall Cognitive Status: Within Functional Limits for tasks assessed (emotional about current issues.)                      Exercises      General Comments        Pertinent Vitals/Pain Pain Assessment: 0-10 Pain Descriptors / Indicators: Crying;Discomfort;Grimacing;Heaviness;Jabbing Pain Intervention(s): Limited activity within patient's tolerance;Monitored during session;Patient requesting pain meds-RN notified;RN gave pain meds during session;Repositioned    Home Living Family/patient expects to be discharged to:: Skilled nursing facility                    Prior Function Level of Independence: Needs  assistance  Gait / Transfers Assistance Needed: WC dependent, was beginning to ambulate w/ L platform with PT.       PT Goals (current goals can now be found in the care plan section) Acute Rehab PT Goals Patient Stated Goal: i want to go home and see my dog. PT Goal Formulation: With patient Time For Goal Achievement: 07/23/14 Potential to Achieve Goals: Good Progress towards PT goals: Progressing toward  goals    Frequency  Min 3X/week    PT Plan Current plan remains appropriate    Co-evaluation             End of Session Equipment Utilized During Treatment: Left knee immobilizer Activity Tolerance: Patient tolerated treatment well Patient left: in bed;with call bell/phone within reach     Time: 1134-1158 PT Time Calculation (min) (ACUTE ONLY): 24 min  Charges:  $Gait Training: 23-37 mins $Therapeutic Activity: 23-37 mins                    G Codes:      Claretha Cooper 07/09/2014, 12:54 PM Tresa Endo PT [phone]

## 2014-07-09 NOTE — Progress Notes (Signed)
NUTRITION NOTE  RD consulted for nutrition education regarding diabetes.   Lab Results  Component Value Date   HGBA1C 8.4* 07/08/2014    RD provided "Carbohydrate Counting for People with Diabetes" handout from the Academy of Nutrition and Dietetics. Discussed different food groups and their effects on blood sugar, emphasizing carbohydrate-containing foods. Provided list of carbohydrates and recommended serving sizes of common foods.  Discussed importance of controlled and consistent carbohydrate intake throughout the day. Provided examples of ways to balance meals/snacks and encouraged intake of high-fiber, whole grain complex carbohydrates. Teach back method used.  Pt reports she has had this education in the past and was able to inform RD of the information gone over in previous education sessions. She states no issues with controlling diet at home and has no questions/concerns at this time.  Expect good compliance.  Body mass index is 34.87 kg/(m^2). Pt meets criteria for obesity based on current BMI.  Current diet order is Low Sodium/Heart Healthy, patient is consuming approximately 100% of meals at this time. Labs and medications reviewed; Novolog sliding scale and Luntus: 35 units/day with CBGs: 112-216 mg/dL. No further nutrition interventions warranted at this time. RD contact information provided. If additional nutrition issues arise, please re-consult RD.    Jarome Matin, RD, LDN Inpatient Clinical Dietitian Pager # [phone] After hours/weekend pager # [phone]

## 2014-07-12 NOTE — Progress Notes (Signed)
   07/09/14 [id number]  PT Time Calculation  PT Start Time (ACUTE ONLY) 0849  PT Stop Time (ACUTE ONLY) 0930  PT Time Calculation (min) (ACUTE ONLY) 41 min  PT G-Codes **NOT FOR INPATIENT CLASS**  Functional Assessment Tool Used clinical judgement  Functional Limitation Mobility: Walking and moving around  Mobility: Walking and Moving Around Current Status [id number]) CM  Mobility: Walking and Moving Around Goal Status [id number]) CJ  PT General Charges  $$ ACUTE PT VISIT 1 Procedure  PT Evaluation  $Initial PT Evaluation Tier I 1 Procedure  PT Treatments  $Therapeutic Activity 23-37 mins  [place] PT [phone]

## 2014-08-10 ENCOUNTER — Telehealth: Payer: Self-pay | Admitting: *Deleted

## 2014-08-10 NOTE — Telephone Encounter (Signed)
Patient left voice mail requesting new Rx for pain. Contract given and per last office note one time only. Attempted to call patient back and got voice mail. Please see Dr. Algis Downs last office note. [name]
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
## 2014-08-11 NOTE — Telephone Encounter (Signed)
I do not recommend giving a refill of Norco. Dr. Algis Downs last note from December stated "Will give her one time rx of percocet 10/650 #30."

## 2014-08-11 NOTE — Telephone Encounter (Signed)
Attempted to call patient, left voice mail with Dr. Hale Bogus response.  Advised she could seek this rx from her PCP or wait until Dr. Johnnye Sima is back in the office to ask him.   Landis Gandy, RN

## 2014-08-12 ENCOUNTER — Telehealth: Payer: Self-pay | Admitting: *Deleted

## 2014-08-12 NOTE — Telephone Encounter (Signed)
Patient notified of Dr. Hale Bogus decision. [name]

## 2014-08-12 NOTE — Telephone Encounter (Signed)
Reached patient by phone and she said she totally understands and she is not going to "sit around and cry" over the decision and will abide by this. She feels that because she signed a pain contract, in her mind it was not a one time Rx. She still asked that I speak to Dr. Johnnye Sima on her behalf when he returns. [name]  CMA

## 2014-08-20 ENCOUNTER — Other Ambulatory Visit: Payer: Self-pay | Admitting: Infectious Diseases

## 2014-08-20 DIAGNOSIS — G609 Hereditary and idiopathic neuropathy, unspecified: Secondary | ICD-10-CM

## 2014-08-20 MED ORDER — HYDROCODONE-ACETAMINOPHEN 10-325 MG PO TABS: 1.0000 | ORAL_TABLET | Freq: Four times a day (QID) | ORAL | Status: DC | PRN

## 2014-08-27 ENCOUNTER — Encounter: Payer: Self-pay | Admitting: Physician Assistant

## 2014-09-02 ENCOUNTER — Telehealth: Payer: Self-pay | Admitting: *Deleted

## 2014-09-02 ENCOUNTER — Other Ambulatory Visit: Payer: Self-pay | Admitting: *Deleted

## 2014-09-02 DIAGNOSIS — B20 Human immunodeficiency virus [HIV] disease: Secondary | ICD-10-CM

## 2014-09-02 MED ORDER — EMTRICITAB-RILPIVIR-TENOFOV AF 200-25-25 MG PO TABS: 1.0000 | ORAL_TABLET | Freq: Every day | ORAL | Status: DC

## 2014-09-02 NOTE — Telephone Encounter (Signed)
Please change pt to odefsey 1 tab daily thanks

## 2014-09-02 NOTE — Telephone Encounter (Signed)
Odefsey denied, waiting PA request. Pharmacy notified.  FYI - Patient has been receiving Atripla prescription refills from [facility], not [facility].

## 2014-09-02 NOTE — Telephone Encounter (Signed)
Patient being referred to Dr. Jimmy Footman by Dr. Frederico Hamman due to consistently elevated serum creatinine.  Gloria from Dr. Deterding's office called, he needs to have the patient switched from Atripla to a less nephrotoxic regimen before he will see her. She is scheduled for consultation with Dr. Jimmy Footman on 9/16 at 11:45.  Please advise.   RN will call [phone] to let Dr. Jimmy Footman know of the new regimen.  Additionally, patient is overdue for labs and office visit. Last seen 12/2013. Landis Gandy, RN

## 2014-09-07 NOTE — Telephone Encounter (Signed)
Odefsey approved 09/02/14 - 12/02/14.  Pharmacy notified.  RN left message for patient asking her to go ahead and start the medication. Landis Gandy, RN

## 2014-09-15 ENCOUNTER — Ambulatory Visit: Payer: Medicare HMO | Admitting: Physician Assistant

## 2014-09-27 ENCOUNTER — Other Ambulatory Visit: Payer: Medicare HMO

## 2014-09-28 ENCOUNTER — Other Ambulatory Visit: Payer: Medicare HMO

## 2014-09-28 DIAGNOSIS — B20 Human immunodeficiency virus [HIV] disease: Secondary | ICD-10-CM

## 2014-09-28 DIAGNOSIS — Z113 Encounter for screening for infections with a predominantly sexual mode of transmission: Secondary | ICD-10-CM

## 2014-09-28 DIAGNOSIS — Z79899 Other long term (current) drug therapy: Secondary | ICD-10-CM

## 2014-09-28 LAB — COMPREHENSIVE METABOLIC PANEL
ALT: 10 U/L (ref 6–29)
AST: 12 U/L (ref 10–35)
Albumin: 3.7 g/dL (ref 3.6–5.1)
Alkaline Phosphatase: 235 U/L — ABNORMAL HIGH (ref 33–130)
BILIRUBIN TOTAL: 0.3 mg/dL (ref 0.2–1.2)
BUN: 44 mg/dL — ABNORMAL HIGH (ref 7–25)
CALCIUM: 9.3 mg/dL (ref 8.6–10.4)
CO2: 28 mmol/L (ref 20–31)
Chloride: 100 mmol/L (ref 98–110)
Creat: 1.81 mg/dL — ABNORMAL HIGH (ref 0.50–1.05)
GLUCOSE: 119 mg/dL — AB (ref 65–99)
Potassium: 4.3 mmol/L (ref 3.5–5.3)
SODIUM: 132 mmol/L — AB (ref 135–146)
Total Protein: 6.8 g/dL (ref 6.1–8.1)

## 2014-09-28 LAB — LIPID PANEL
Cholesterol: 146 mg/dL (ref 125–200)
HDL: 45 mg/dL — AB (ref >=46)
LDL Cholesterol: 75 mg/dL (ref <130)
Total CHOL/HDL Ratio: 3.2 Ratio (ref <=5.0)
Triglycerides: 130 mg/dL (ref <150)
VLDL: 26 mg/dL (ref <30)

## 2014-09-28 LAB — CBC
HEMATOCRIT: 37.2 % (ref 36.0–46.0)
Hemoglobin: 11.6 g/dL — ABNORMAL LOW (ref 12.0–15.0)
MCH: 26.7 pg (ref 26.0–34.0)
MCHC: 31.2 g/dL (ref 30.0–36.0)
MCV: 85.5 fL (ref 78.0–100.0)
MPV: 11.1 fL (ref 8.6–12.4)
PLATELETS: 337 10*3/uL (ref 150–400)
RBC: 4.35 MIL/uL (ref 3.87–5.11)
RDW: 17.3 % — AB (ref 11.5–15.5)
WBC: 12 10*3/uL — AB (ref 4.0–10.5)

## 2014-09-28 LAB — RPR

## 2014-09-30 LAB — HIV-1 RNA QUANT-NO REFLEX-BLD
HIV 1 RNA Quant: <20 copies/mL (ref <20)
HIV-1 RNA Quant, Log: <1.3 {Log} (ref <1.30)

## 2014-09-30 LAB — T-HELPER CELL (CD4) - (RCID CLINIC ONLY)
CD4 % Helper T Cell: 29 % — ABNORMAL LOW (ref 33–55)
CD4 T CELL ABS: 570 /uL (ref 400–2700)

## 2014-10-01 ENCOUNTER — Encounter: Payer: Self-pay | Admitting: Gastroenterology

## 2014-10-11 ENCOUNTER — Encounter: Payer: Self-pay | Admitting: Infectious Diseases

## 2014-10-11 ENCOUNTER — Ambulatory Visit (INDEPENDENT_AMBULATORY_CARE_PROVIDER_SITE_OTHER): Payer: Medicare HMO | Admitting: Infectious Diseases

## 2014-10-11 VITALS — BP 116/74 | HR 74 | Temp 98.3°F

## 2014-10-11 DIAGNOSIS — Z23 Encounter for immunization: Secondary | ICD-10-CM

## 2014-10-11 DIAGNOSIS — Z79899 Other long term (current) drug therapy: Secondary | ICD-10-CM

## 2014-10-11 DIAGNOSIS — N179 Acute kidney failure, unspecified: Secondary | ICD-10-CM | POA: Diagnosis not present

## 2014-10-11 DIAGNOSIS — B20 Human immunodeficiency virus [HIV] disease: Secondary | ICD-10-CM

## 2014-10-11 DIAGNOSIS — IMO0002 Reserved for concepts with insufficient information to code with codable children: Secondary | ICD-10-CM

## 2014-10-11 DIAGNOSIS — E118 Type 2 diabetes mellitus with unspecified complications: Secondary | ICD-10-CM | POA: Diagnosis not present

## 2014-10-11 DIAGNOSIS — S72002S Fracture of unspecified part of neck of left femur, sequela: Secondary | ICD-10-CM | POA: Diagnosis not present

## 2014-10-11 DIAGNOSIS — Z113 Encounter for screening for infections with a predominantly sexual mode of transmission: Secondary | ICD-10-CM

## 2014-10-11 DIAGNOSIS — E1165 Type 2 diabetes mellitus with hyperglycemia: Secondary | ICD-10-CM
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
## 2014-10-11 NOTE — Assessment & Plan Note (Signed)
greatly appreciate renal f/u She likes Dr Marval Regal very much.

## 2014-10-11 NOTE — Assessment & Plan Note (Signed)
She appears to be doing well.  Will f/u with ortho. PT as she can tolerate Pain mgmt via pain center .

## 2014-10-11 NOTE — Assessment & Plan Note (Signed)
She is doing very well.  Will continue her current ART Give her flu shot.  Will try to help her find new PCP.  rtc 6 months

## 2014-10-11 NOTE — Assessment & Plan Note (Signed)
Will get her into new PCP.

## 2014-10-13 ENCOUNTER — Ambulatory Visit: Payer: Medicare HMO | Admitting: Internal Medicine

## 2014-10-19 ENCOUNTER — Encounter: Payer: Self-pay | Admitting: Infectious Diseases

## 2014-10-20 ENCOUNTER — Other Ambulatory Visit: Payer: Self-pay | Admitting: *Deleted

## 2014-10-20 MED ORDER — INSULIN GLARGINE 100 UNIT/ML ~~LOC~~ SOLN: 35.0000 [IU] | Freq: Every day | SUBCUTANEOUS | Status: DC

## 2014-10-20 NOTE — Telephone Encounter (Signed)
Please advise if you will be willing to prescribe her insulin until she sees endocrinology. Landis Gandy, RN

## 2014-11-03 ENCOUNTER — Ambulatory Visit: Payer: Medicare HMO | Admitting: Internal Medicine

## 2014-11-08 ENCOUNTER — Encounter: Payer: Self-pay | Admitting: Endocrinology

## 2014-11-08 ENCOUNTER — Ambulatory Visit (INDEPENDENT_AMBULATORY_CARE_PROVIDER_SITE_OTHER): Payer: Medicare HMO | Admitting: Endocrinology

## 2014-11-08 ENCOUNTER — Other Ambulatory Visit: Payer: Self-pay | Admitting: Infectious Diseases

## 2014-11-08 VITALS — BP 112/68 | HR 68 | Temp 99.3°F | Resp 14

## 2014-11-08 DIAGNOSIS — E1142 Type 2 diabetes mellitus with diabetic polyneuropathy: Secondary | ICD-10-CM

## 2014-11-08 DIAGNOSIS — E1165 Type 2 diabetes mellitus with hyperglycemia: Secondary | ICD-10-CM

## 2014-11-08 DIAGNOSIS — E118 Type 2 diabetes mellitus with unspecified complications: Secondary | ICD-10-CM

## 2014-11-08 DIAGNOSIS — L03116 Cellulitis of left lower limb: Secondary | ICD-10-CM

## 2014-11-08 LAB — POCT GLUCOSE (DEVICE FOR HOME USE): Glucose Fasting, POC: 421 mg/dL — AB (ref 70–99)

## 2014-11-08 NOTE — Patient Instructions (Addendum)
TOUJEO insulin: Take 50 units twice a day about 12 hours apart NOVOLOG insulin: Take 20 units before each meal and with blood sugars over 200 take additional 5 units for every 50 points  Start VICTOZA injection once a day when back from the hospital, taken once every 24 hours at the same time  Dial the dose to 0.6 mg on the pen for the first week.  You may inject in the stomach, thigh or arm.  You may experience nausea in the first few days which usually goes away.  You will feel fullness of the stomach with starting the medication and should try to keep the portions at meals small. After 1 week increase the dose to 1.2mg  daily if no nausea present.   If any questions or concerns are present call the office or the [place] helpline at [phone]. Visit http://www.wall.info/ for more useful information  Check blood sugars on waking up at least every other day and some readings before other meals  Also check blood sugars about 2 hours after a meal once a day and do this after different meals by rotation  Recommended blood sugar levels on waking up is 90-130 and about 2 hours after meal is 130-160  Please bring your blood sugar monitor to each visit, thank you  No drinks with sugar

## 2014-11-08 NOTE — Progress Notes (Signed)
Patient ID: [name], female   DOB: [birth year], 49 y.o.   MRN: [id number]           Reason for Appointment: Consultation for Type 2 Diabetes   History of Present Illness:          Date of diagnosis of type 2 diabetes mellitus: 2000       Background history:     The patient was apparently diagnosed incidentally with her diabetes 16 years ago 16 years ago, was asymptomatic. She took metformin and possibly other oral hypoglycemic drugs but subsequently went on insulin , probably about 5 years later  Details of her previous treatment are not available  She was taking various insulin regimens but most recently had been taking Lantus insulin.  She was being treated by an endocrinologist some time ago and he switched her  To Toujeo and also tried her on Byetta.  She thinks that she had better blood sugars with Byetta but she did not follow-up with him  Recent history:   INSULIN regimen is: Novolog 20 units usually before meals. Toujeo 70 units qd in the mornings     Insulin has been used since 2005  Previously she has been taking as much as 70 units twice a day of basal insulin but her PCP reduce this to 70 units As above she took Byetta also previously but more recently has been taking Novolog at mealtime She takes Novolog only based on her blood sugar and not clear how often she takes this  She has been referred here for persistently poor control.  Her A1c has usually been 9-11%  Current blood sugar patterns and problems identified:    she is unclear about how often she is checking her blood sugars and does not recall the actual numbers.  She thinks her blood sugars are about the same before breakfast and suppertime and generally around 230  Her blood glucose today is 421 in the office  She does not check her blood sugars after meals  Non-insulin hypoglycemic drugs the patient is taking are: none      Side effects from medications have been:  Compliance with the medical  regimen:  fair Hypoglycemia:  rarely low if not eating , none for 2 months  Glucose monitoring:  done  times a day         Glucometer: One Touch.      Blood Glucose readings by recall:    PREMEAL Breakfast Lunch Dinner Bedtime  Overall   Glucose range: 230  230    Median:        Self-care: The diet that the patient has been following is: tries to limit .     Meal times: Breakfast: Lunch: Dinner:   Typical meal intake: Breakfast is   oatmeal or fried egg sandwich.  Lunch is variable, has mixed meal at dinnertime, usually avoiding fried food.  Has variable amounts of snacks.  She is avoiding drinks with sugar except since last night has been drinking sweet tea               Dietician visit, most recent: None               Exercise:  none  Weight history:  Wt Readings from Last 3 Encounters:  07/08/14 243 lb (110.224 kg)  05/10/14 230 lb 9.6 oz (104.6 kg)  05/04/14 233 lb (105.688 kg)    Glycemic control: 11 in 8/16   Lab Results  Component Value  Date   HGBA1C 8.4* 07/08/2014   HGBA1C 8.3* 05/03/2014   HGBA1C 9.0* 07/30/2012   Lab Results  Component Value Date   MICROALBUR 2.23* 07/17/2010   LDLCALC 75 09/28/2014   CREATININE 1.81* 09/28/2014         Medication List       This list is accurate as of: 11/08/14  8:38 PM.  Always use your most recent med list.               albuterol (2.5 MG/3ML) 0.083% nebulizer solution  Commonly known as:  PROVENTIL  Take 2.5 mg by nebulization every 6 (six) hours as needed for wheezing. For wheezing or shortness of breath     ARIPiprazole 2 MG tablet  Commonly known as:  ABILIFY  Take 2 mg by mouth daily. Mental health     aspirin EC 325 MG tablet  Take 325 mg by mouth 2 (two) times daily. Prophylaxis.     atenolol 25 MG tablet  Commonly known as:  TENORMIN  Take 25 mg by mouth every morning.     atorvastatin 40 MG tablet  Commonly known as:  LIPITOR  Take 40 mg by mouth daily.     budesonide-formoterol 80-4.5  MCG/ACT inhaler  Commonly known as:  SYMBICORT  Inhale 2 puffs into the lungs 2 (two) times daily.     clonazePAM 0.5 MG tablet  Commonly known as:  KLONOPIN  Take 0.5 mg by mouth 4 (four) times daily.     docusate sodium 100 MG capsule  Commonly known as:  COLACE  Take 1 capsule (100 mg total) by mouth 2 (two) times daily.     emtricitabine-rilpivir-tenofovir AF 200-25-25 MG Tabs tablet  Commonly known as:  ODEFSEY  Take 1 tablet by mouth daily with breakfast.     escitalopram 20 MG tablet  Commonly known as:  LEXAPRO  Take 20 mg by mouth daily.     eszopiclone 2 MG Tabs tablet  Commonly known as:  LUNESTA  Take 2 mg by mouth at bedtime. Take immediately before bedtime     feeding supplement (GLUCERNA SHAKE) Liqd  Take 237 mLs by mouth 3 (three) times daily between meals.     fenofibrate 145 MG tablet  Commonly known as:  TRICOR  Take 1 tablet (145 mg total) by mouth daily.     fenofibrate micronized 200 MG capsule  Commonly known as:  LOFIBRA  Take 200 mg by mouth daily before breakfast.     HYDROcodone-acetaminophen 10-325 MG tablet  Commonly known as:  NORCO  Take 1 tablet by mouth every 6 (six) hours as needed.     insulin aspart 100 UNIT/ML injection  Commonly known as:  novoLOG  Inject 0-10 Units into the skin 2 (two) times daily. Per sliding scale:  151-200 =  2 201-250 =  3 251-300 =  4 301-350 =  6 351-400 =  8 401+      = 10 units and call provider.     TOUJEO SOLOSTAR 300 UNIT/ML Sopn  Generic drug:  Insulin Glargine  Inject 70 Units into the skin daily.     insulin glargine 100 UNIT/ML injection  Commonly known as:  LANTUS  Inject 0.35 mLs (35 Units total) into the skin daily.     methocarbamol 500 MG tablet  Commonly known as:  ROBAXIN  Take 1 tablet (500 mg total) by mouth every 6 (six) hours as needed for muscle spasms.     multivitamin with  minerals tablet  Take 1 tablet by mouth daily.     OXYCONTIN 10 mg 12 hr tablet  Generic drug:   oxyCODONE  Take 10 mg by mouth 2 (two) times daily. Left hip pain.     pregabalin 225 MG capsule  Commonly known as:  LYRICA  Take 225 mg by mouth 2 (two) times daily.     Vitamin D3 5000 UNITS Tabs  Take 1 tablet by mouth daily.     vitamin E 1000 UNIT capsule  Generic drug:  vitamin E  Take 1,000 Units by mouth daily.        Allergies:  Allergies  Allergen Reactions  . Cortizone-10 [Hydrocortisone] Rash    Per patient, given local injection at knee and developed rash at local site.     Past Medical History  Diagnosis Date  . Anxiety   . Depression   . Hypertension   . HIV infection ([place]) 2000  . Asthma     HOSPITALIZED WITH EXCERBATION OF ASTHMA - AND BRONCHITIS AND THE FLU DEC 2013  . Shortness of breath     ALLERGIES ARE "ACTING UP"  . Diabetes mellitus     ON INSULIN AND ORAL MEDICATIONS  . GERD (gastroesophageal reflux disease)   . Neuropathy     NEUROPATHY HANDS AND FEET  . Arthritis     LEFT HIP  . Pain     SEVERE PAIN LEFT HIP - HX OF LEFT HIP PINNING JULY 2013    Past Surgical History  Procedure Laterality Date  . Hip pinning,cannulated  07/16/2011    Procedure: CANNULATED HIP PINNING;  Surgeon: Gearlean Alf, MD;  Location: [facility];  Service: Orthopedics;  Laterality: Left;  . Hip pinning,cannulated Left 04/09/2012    Procedure: CANNULATED HIP PINNING AND HARDWARE REVISION;  Surgeon: Gearlean Alf, MD;  Location: [facility];  Service: Orthopedics;  Laterality: Left;  . Hip arthroplasty Left 05/05/2014    Procedure: ARTHROPLASTY OPEN REDUCTION INTERNAL FIXATION LEFT HIP AND REMOVAL OF TWO CANNULATED SCREW;  Surgeon: Latanya Maudlin, MD;  Location: [facility];  Service: Orthopedics;  Laterality: Left;  . Cast application Left [id number]    Procedure: CAST APPLICATION (FIBERGLASS);  Surgeon: Latanya Maudlin, MD;  Location: [facility];  Service: Orthopedics;  Laterality: Left;  CLOSED REDUCTION OF LEFT COLLES FRACTURE WITH SHORT ARM CAST  . Hardware removal Left 05/05/2014      Procedure: HARDWARE REMOVAL LEFT HIP;  Surgeon: Latanya Maudlin, MD;  Location: [facility];  Service: Orthopedics;  Laterality: Left;  REMOVAL BIOMET 6.5-8.0 CANNULATED SCREW    Family History  Problem Relation Age of Onset  . Diabetes Mother   . Hypertension Mother   . Vision loss Mother   . Heart disease Mother   . Hypertension Father   . Thyroid disease Neg Hx     Social History:  reports that she has been smoking E-cigarettes.  She has a 17.5 pack-year smoking history. She has never used smokeless tobacco. She reports that she does not drink alcohol or use illicit drugs.    Review of Systems    FOOT infection: She had a blister on her left heel  in the last about a month ago which healed on its own. About 10 days ago she started having a crack in the front of the left ankle which became ulcerated and started to have redness and she has having this dressed  by her mother with Silvadene but has not had this treated otherwise. She has  had 3-4 days worsening swelling in the foot since Friday  Lipid history: Has been treated with Lipitor 40 mg and fenofibrate    Lab Results  Component Value Date   CHOL 146 09/28/2014   HDL 45* 09/28/2014   LDLCALC 75 09/28/2014   TRIG 130 09/28/2014   CHOLHDL 3.2 09/28/2014           Constitutional: no recent weight gain/loss, she does have complaints of unusual fatigue  She has variable appetite, recently less  Eyes: periodic history of blurred vision.  Most recent eye exam was 1/16, reportedly no retinopathy  ENT: no nasal congestion, difficulty swallowing, no hoarseness   Cardiovascular: no chest pain or tightness on exertion.  On and off leg swelling.  Hypertension:unknown History, currently only taking low-dose atenolol and no ACE inhibitor  Respiratory: no cough/shortness of breath  Gastrointestinal: she has periodic constipation, diarrhea but no nausea or abdominal pain, occasional heartburn  Musculoskeletal: no muscle/joint  aches   Urological:   No frequency of urination or  nocturia  Skin: no rash or infections  Neurological: occasional headaches.  Has no numbness, burning, pains or tingling in feet, not clear why she is taking Lyrica    Psychiatric: no symptoms of depression  Endocrine: No cold intolerance or history of thyroid disease  She stopped having menstrual cycles at the age of 24  Physical Examination:  BP 112/68 mmHg  Pulse 68  Temp(Src) 99.3 F (37.4 C) (Oral)  Resp 14  Ht   Wt   SpO2 95%  LMP 11/16/2010  GENERAL:         Patient has generalized obesity.   HEENT:         Eye exam shows normal external appearance. Fundus exam shows no retinopathy. Oral exam shows normal mucosa .  NECK:   There is no lymphadenopathy Thyroid is not enlarged and no nodules felt.  Carotids are normal to palpation and no bruit heard LUNGS:         Chest is symmetrical. Lungs are clear to auscultation.[name] Kitchen   HEART:         Heart sounds:  S1 and S2 are normal. No murmur or click heard., no S3 or S4.   ABDOMEN:   There is no distention present. Liver and spleen are not palpable. No other mass or tenderness present.   NEUROLOGICAL:   Vibration sense is  reduced in distal first toes. Ankle jerks are absent bilaterally.          She has significant swelling and redness of the left foot, warmth is extending up to the lower leg Dorsum of the foot has denudation of the skin with yellowish/greenish discoloration extending to the ankle area.  This is malodorous also Monofilament sensation absent in the toes on both feet Pedal pulse is not palpable on the left because of swelling in infection, reduced right dorsalis pedis MUSCULOSKELETAL:  There is no swelling or deformity of the peripheral joints. Spine is normal to inspection.   EXTREMITIES:     There is no edema. No skin lesions present.[name] Kitchen SKIN:       No rash, foot exam as above       ASSESSMENT:  Diabetes type 2, uncontrolled     Currently she is taking insulin  along with mostly basal insulin and no other drugs for diabetes She has had persistently high A1c readings for the last 2-3 years, only somewhat better in 5/16 when she had an A1c of 8.3; most likely then  she was probably taking larger doses of basal insulin Today her glucose is over 400 and may be worse with her recent infection as well as consuming drinks with sugar recently She continues to have significant obesity, unable take weight today  She will need more aggressive doses of insulin with basal and bolus and also better regimen of glucose monitoring, diet and when able to start exercise program Discussed with her that her A1c target needs to be under 7% and has been significantly higher consistently  She does report somewhat better blood sugars with Byetta but he does not available. Not able to take metformin because of renal dysfunction, last creatinine 1.8  Complications: peripheral neuropathy sensory loss, probable diabetic nephropathy with renal dysfunction    Severe left diabetic foot infection with cellulitis and large area of skin ulceration on the dorsum of the foot. This is worse over the last 3-4 days and needs to be treated with intravenous antibiotics urgently  HYPERLIPIDEMIA: Followed by PCP  Other medical problems include HIV, depression followed by PCP  PLAN:     she was referred to the emergency room for immediate admission for management of her foot infection and optimize her diabetes control in the inpatient situation.  Patient agrees to go to the emergency room today as recommended  For her diabetes management she needs to increase her Toujeo in take 50 units twice a day  She will also need to take at least 20 units Novolog with each meal and higher amounts based on blood sugar level Discussed with the patient the nature of GLP-1 drugs, the actions on various organ systems, how they benefit blood glucose control, as well as the benefit of weight loss and  increase  satiety . Explained possible side effects especially nausea and vomiting initially; discussed safety information in package insert.  Described the injection technique and dosage titration of Victoza  starting with 0.6 mg once a day at the same time for the first week and then increasing to 1.2 mg if no symptoms of nausea.  Educational brochure on Victoza  given.  If she is being hospitalized she can start this after discharge. She will need to monitor blood sugars more consistently and bring monitor to download on each visit.  Discussed needing to check more readings after meals and especially after evening meal to help adjust her mealtime doses She will benefit from diabetes education and may need to be seen by nurse educator and dietitian To avoid drinks with sugar Follow-up in 2 weeks   Counseling time on subjects discussed above is over 50% of today's 60 minute visit   Patient Instructions  TOUJEO insulin: Take 15 units twice a day about 12 hours apart NOVOLOG insulin: Take 20 units before each meal and with blood sugars over 200 take additional 5 units for every 50 points  Start VICTOZA injection once a day when back from the hospital, taken once every 24 hours at the same time  Dial the dose to 0.6 mg on the pen for the first week.  You may inject in the stomach, thigh or arm.  You may experience nausea in the first few days which usually goes away.  You will feel fullness of the stomach with starting the medication and should try to keep the portions at meals small. After 1 week increase the dose to 1.2mg  daily if no nausea present.   If any questions or concerns are present call the office or the [place] helpline at  [phone]. Visit http://www.wall.info/ for more useful information  Check blood sugars on waking up at least every other day and some readings before other meals  Also check blood sugars about 2 hours after a meal once a day and do this after  different meals by rotation  Recommended blood sugar levels on waking up is 90-130 and about 2 hours after meal is 130-160  Please bring your blood sugar monitor to each visit, thank you  No drinks with sugar      Addendum: Toujeo dose is 15 units, not 15 as printed in patient instructions   Sultan Pargas 11/08/2014, 8:38 PM   Note: This office note was prepared with Dragon voice recognition system technology. Any transcriptional errors that result from this process are unintentional.

## 2014-11-09 ENCOUNTER — Emergency Department (HOSPITAL_COMMUNITY): Payer: Medicare HMO

## 2014-11-09 ENCOUNTER — Encounter (HOSPITAL_COMMUNITY): Payer: Self-pay | Admitting: Emergency Medicine

## 2014-11-09 ENCOUNTER — Inpatient Hospital Stay (HOSPITAL_COMMUNITY)
Admission: EM | Admit: 2014-11-09 | Discharge: 2014-11-22 | DRG: 239 | Disposition: A | Discharge status: Rehabilitation Facility | Payer: Medicare HMO | Attending: Internal Medicine | Admitting: Internal Medicine

## 2014-11-09 DIAGNOSIS — F419 Anxiety disorder, unspecified: Secondary | ICD-10-CM | POA: Diagnosis present

## 2014-11-09 DIAGNOSIS — I129 Hypertensive chronic kidney disease with stage 1 through stage 4 chronic kidney disease, or unspecified chronic kidney disease: Secondary | ICD-10-CM | POA: Diagnosis present

## 2014-11-09 DIAGNOSIS — S91309A Unspecified open wound, unspecified foot, initial encounter: Secondary | ICD-10-CM | POA: Diagnosis present

## 2014-11-09 DIAGNOSIS — E871 Hypo-osmolality and hyponatremia: Secondary | ICD-10-CM | POA: Diagnosis present

## 2014-11-09 DIAGNOSIS — K219 Gastro-esophageal reflux disease without esophagitis: Secondary | ICD-10-CM | POA: Diagnosis present

## 2014-11-09 DIAGNOSIS — Z833 Family history of diabetes mellitus: Secondary | ICD-10-CM | POA: Diagnosis not present

## 2014-11-09 DIAGNOSIS — E1142 Type 2 diabetes mellitus with diabetic polyneuropathy: Secondary | ICD-10-CM | POA: Diagnosis not present

## 2014-11-09 DIAGNOSIS — E11621 Type 2 diabetes mellitus with foot ulcer: Secondary | ICD-10-CM | POA: Diagnosis present

## 2014-11-09 DIAGNOSIS — E1140 Type 2 diabetes mellitus with diabetic neuropathy, unspecified: Secondary | ICD-10-CM | POA: Diagnosis present

## 2014-11-09 DIAGNOSIS — Z79899 Other long term (current) drug therapy: Secondary | ICD-10-CM

## 2014-11-09 DIAGNOSIS — L089 Local infection of the skin and subcutaneous tissue, unspecified: Secondary | ICD-10-CM | POA: Diagnosis not present

## 2014-11-09 DIAGNOSIS — F17200 Nicotine dependence, unspecified, uncomplicated: Secondary | ICD-10-CM | POA: Diagnosis present

## 2014-11-09 DIAGNOSIS — I70209 Unspecified atherosclerosis of native arteries of extremities, unspecified extremity: Secondary | ICD-10-CM

## 2014-11-09 DIAGNOSIS — L0390 Cellulitis, unspecified: Secondary | ICD-10-CM | POA: Diagnosis present

## 2014-11-09 DIAGNOSIS — I10 Essential (primary) hypertension: Secondary | ICD-10-CM | POA: Diagnosis not present

## 2014-11-09 DIAGNOSIS — J45909 Unspecified asthma, uncomplicated: Secondary | ICD-10-CM | POA: Diagnosis present

## 2014-11-09 DIAGNOSIS — Z993 Dependence on wheelchair: Secondary | ICD-10-CM

## 2014-11-09 DIAGNOSIS — R0602 Shortness of breath: Secondary | ICD-10-CM

## 2014-11-09 DIAGNOSIS — I739 Peripheral vascular disease, unspecified: Secondary | ICD-10-CM | POA: Diagnosis not present

## 2014-11-09 DIAGNOSIS — L97529 Non-pressure chronic ulcer of other part of left foot with unspecified severity: Secondary | ICD-10-CM | POA: Diagnosis present

## 2014-11-09 DIAGNOSIS — Z21 Asymptomatic human immunodeficiency virus [HIV] infection status: Secondary | ICD-10-CM | POA: Diagnosis not present

## 2014-11-09 DIAGNOSIS — E11628 Type 2 diabetes mellitus with other skin complications: Secondary | ICD-10-CM | POA: Diagnosis present

## 2014-11-09 DIAGNOSIS — Z7984 Long term (current) use of oral hypoglycemic drugs: Secondary | ICD-10-CM

## 2014-11-09 DIAGNOSIS — I9589 Other hypotension: Secondary | ICD-10-CM | POA: Diagnosis present

## 2014-11-09 DIAGNOSIS — Z794 Long term (current) use of insulin: Secondary | ICD-10-CM

## 2014-11-09 DIAGNOSIS — R059 Cough, unspecified: Secondary | ICD-10-CM

## 2014-11-09 DIAGNOSIS — E785 Hyperlipidemia, unspecified: Secondary | ICD-10-CM | POA: Diagnosis present

## 2014-11-09 DIAGNOSIS — Z7951 Long term (current) use of inhaled steroids: Secondary | ICD-10-CM | POA: Diagnosis not present

## 2014-11-09 DIAGNOSIS — Z89512 Acquired absence of left leg below knee: Secondary | ICD-10-CM | POA: Diagnosis not present

## 2014-11-09 DIAGNOSIS — E118 Type 2 diabetes mellitus with unspecified complications: Secondary | ICD-10-CM | POA: Diagnosis not present

## 2014-11-09 DIAGNOSIS — B20 Human immunodeficiency virus [HIV] disease: Secondary | ICD-10-CM | POA: Diagnosis present

## 2014-11-09 DIAGNOSIS — Z888 Allergy status to other drugs, medicaments and biological substances status: Secondary | ICD-10-CM

## 2014-11-09 DIAGNOSIS — I70269 Atherosclerosis of native arteries of extremities with gangrene, unspecified extremity: Secondary | ICD-10-CM

## 2014-11-09 DIAGNOSIS — F329 Major depressive disorder, single episode, unspecified: Secondary | ICD-10-CM | POA: Diagnosis present

## 2014-11-09 DIAGNOSIS — E119 Type 2 diabetes mellitus without complications: Secondary | ICD-10-CM | POA: Diagnosis not present

## 2014-11-09 DIAGNOSIS — I70245 Atherosclerosis of native arteries of left leg with ulceration of other part of foot: Secondary | ICD-10-CM | POA: Diagnosis not present

## 2014-11-09 DIAGNOSIS — D638 Anemia in other chronic diseases classified elsewhere: Secondary | ICD-10-CM | POA: Diagnosis present

## 2014-11-09 DIAGNOSIS — N184 Chronic kidney disease, stage 4 (severe): Secondary | ICD-10-CM | POA: Diagnosis present

## 2014-11-09 DIAGNOSIS — E1152 Type 2 diabetes mellitus with diabetic peripheral angiopathy with gangrene: Secondary | ICD-10-CM | POA: Diagnosis present

## 2014-11-09 DIAGNOSIS — E1121 Type 2 diabetes mellitus with diabetic nephropathy: Secondary | ICD-10-CM | POA: Diagnosis present

## 2014-11-09 DIAGNOSIS — D62 Acute posthemorrhagic anemia: Secondary | ICD-10-CM | POA: Diagnosis not present

## 2014-11-09 DIAGNOSIS — E1165 Type 2 diabetes mellitus with hyperglycemia: Secondary | ICD-10-CM | POA: Diagnosis present

## 2014-11-09 DIAGNOSIS — L89623 Pressure ulcer of left heel, stage 3: Secondary | ICD-10-CM | POA: Diagnosis present

## 2014-11-09 DIAGNOSIS — E1169 Type 2 diabetes mellitus with other specified complication: Secondary | ICD-10-CM | POA: Diagnosis not present

## 2014-11-09 DIAGNOSIS — IMO0002 Reserved for concepts with insufficient information to code with codable children: Secondary | ICD-10-CM | POA: Diagnosis present

## 2014-11-09 DIAGNOSIS — R05 Cough: Secondary | ICD-10-CM

## 2014-11-09 DIAGNOSIS — Z8249 Family history of ischemic heart disease and other diseases of the circulatory system: Secondary | ICD-10-CM

## 2014-11-09 DIAGNOSIS — E1159 Type 2 diabetes mellitus with other circulatory complications: Secondary | ICD-10-CM | POA: Diagnosis present

## 2014-11-09 DIAGNOSIS — G609 Hereditary and idiopathic neuropathy, unspecified: Secondary | ICD-10-CM

## 2014-11-09 DIAGNOSIS — I5189 Other ill-defined heart diseases: Secondary | ICD-10-CM | POA: Diagnosis present

## 2014-11-09 DIAGNOSIS — E1122 Type 2 diabetes mellitus with diabetic chronic kidney disease: Secondary | ICD-10-CM | POA: Diagnosis present

## 2014-11-09 DIAGNOSIS — L98499 Non-pressure chronic ulcer of skin of other sites with unspecified severity: Secondary | ICD-10-CM

## 2014-11-09 DIAGNOSIS — F39 Unspecified mood [affective] disorder: Secondary | ICD-10-CM | POA: Diagnosis present

## 2014-11-09 HISTORY — DX: Local infection of the skin and subcutaneous tissue, unspecified: E11.628

## 2014-11-09 LAB — CBC WITH DIFFERENTIAL/PLATELET
BASOS ABS: 0 10*3/uL (ref 0.0–0.1)
Basophils Relative: 0 %
EOS PCT: 1 %
Eosinophils Absolute: 0.1 10*3/uL (ref 0.0–0.7)
HCT: 29.7 % — ABNORMAL LOW (ref 36.0–46.0)
HEMOGLOBIN: 9.5 g/dL — AB (ref 12.0–15.0)
LYMPHS PCT: 9 %
Lymphs Abs: 1.3 10*3/uL (ref 0.7–4.0)
MCH: 26.8 pg (ref 26.0–34.0)
MCHC: 32 g/dL (ref 30.0–36.0)
MCV: 83.7 fL (ref 78.0–100.0)
Monocytes Absolute: 0.7 10*3/uL (ref 0.1–1.0)
Monocytes Relative: 5 %
NEUTROS PCT: 85 %
Neutro Abs: 12.5 10*3/uL — ABNORMAL HIGH (ref 1.7–7.7)
Platelets: 487 10*3/uL — ABNORMAL HIGH (ref 150–400)
RBC: 3.55 MIL/uL — AB (ref 3.87–5.11)
RDW: 16.9 % — ABNORMAL HIGH (ref 11.5–15.5)
WBC: 14.6 10*3/uL — ABNORMAL HIGH (ref 4.0–10.5)

## 2014-11-09 LAB — URINALYSIS, ROUTINE W REFLEX MICROSCOPIC
BILIRUBIN URINE: NEGATIVE
Glucose, UA: >1000 mg/dL — AB
KETONES UR: NEGATIVE mg/dL
LEUKOCYTES UA: NEGATIVE
NITRITE: NEGATIVE
PH: 5.5 (ref 5.0–8.0)
PROTEIN: NEGATIVE mg/dL
Specific Gravity, Urine: 1.018 (ref 1.005–1.030)
Urobilinogen, UA: 0.2 mg/dL (ref 0.0–1.0)

## 2014-11-09 LAB — BASIC METABOLIC PANEL
Anion gap: 10 (ref 5–15)
BUN: 32 mg/dL — ABNORMAL HIGH (ref 6–20)
CALCIUM: 9.4 mg/dL (ref 8.9–10.3)
CO2: 25 mmol/L (ref 22–32)
Chloride: 95 mmol/L — ABNORMAL LOW (ref 101–111)
Creatinine, Ser: 1.99 mg/dL — ABNORMAL HIGH (ref 0.44–1.00)
GFR, EST AFRICAN AMERICAN: 33 mL/min — AB (ref >60)
GFR, EST NON AFRICAN AMERICAN: 28 mL/min — AB (ref >60)
GLUCOSE: 447 mg/dL — AB (ref 65–99)
Potassium: 4.3 mmol/L (ref 3.5–5.1)
SODIUM: 130 mmol/L — AB (ref 135–145)

## 2014-11-09 LAB — URINE MICROSCOPIC-ADD ON

## 2014-11-09 LAB — I-STAT CG4 LACTIC ACID, ED: Lactic Acid, Venous: 1.37 mmol/L (ref 0.5–2.0)

## 2014-11-09 LAB — GLUCOSE, CAPILLARY: GLUCOSE-CAPILLARY: 209 mg/dL — AB (ref 65–99)

## 2014-11-09 MED ORDER — INSULIN ASPART 100 UNIT/ML ~~LOC~~ SOLN
0.0000 [IU] | Freq: Three times a day (TID) | SUBCUTANEOUS | Status: DC
  Administered 2014-11-10: 8 [IU] via SUBCUTANEOUS
  Administered 2014-11-10: 3 [IU] via SUBCUTANEOUS
  Administered 2014-11-10: 2 [IU] via SUBCUTANEOUS
  Administered 2014-11-11: 3 [IU] via SUBCUTANEOUS
  Administered 2014-11-11: 11 [IU] via SUBCUTANEOUS
  Administered 2014-11-11: 3 [IU] via SUBCUTANEOUS
  Administered 2014-11-12: 5 [IU] via SUBCUTANEOUS
  Administered 2014-11-12: 11 [IU] via SUBCUTANEOUS
  Administered 2014-11-12: 3 [IU] via SUBCUTANEOUS

## 2014-11-09 MED ORDER — ATENOLOL 25 MG PO TABS
25.0000 mg | ORAL_TABLET | Freq: Every morning | ORAL | Status: DC
  Administered 2014-11-10: 25 mg via ORAL
  Filled 2014-11-09 (×2): qty 1

## 2014-11-09 MED ORDER — CYCLOBENZAPRINE HCL 10 MG PO TABS
10.0000 mg | ORAL_TABLET | Freq: Two times a day (BID) | ORAL | Status: DC | PRN
  Administered 2014-11-09: 10 mg via ORAL
  Filled 2014-11-09: qty 1

## 2014-11-09 MED ORDER — ZOLPIDEM TARTRATE 5 MG PO TABS
5.0000 mg | ORAL_TABLET | Freq: Every evening | ORAL | Status: DC | PRN
  Administered 2014-11-09 – 2014-11-11 (×2): 5 mg via ORAL
  Filled 2014-11-09 (×2): qty 1

## 2014-11-09 MED ORDER — INSULIN GLARGINE 100 UNIT/ML ~~LOC~~ SOLN
70.0000 [IU] | Freq: Every day | SUBCUTANEOUS | Status: DC
  Administered 2014-11-10 – 2014-11-14 (×5): 70 [IU] via SUBCUTANEOUS
  Administered 2014-11-16: 35 [IU] via SUBCUTANEOUS
  Filled 2014-11-09 (×7): qty 0.7

## 2014-11-09 MED ORDER — ESCITALOPRAM OXALATE 10 MG PO TABS
20.0000 mg | ORAL_TABLET | Freq: Every day | ORAL | Status: DC
  Administered 2014-11-10 – 2014-11-22 (×12): 20 mg via ORAL
  Filled 2014-11-09: qty 2
  Filled 2014-11-09: qty 1
  Filled 2014-11-09 (×2): qty 2
  Filled 2014-11-09: qty 1
  Filled 2014-11-09 (×2): qty 2
  Filled 2014-11-09: qty 1
  Filled 2014-11-09: qty 2
  Filled 2014-11-09: qty 1
  Filled 2014-11-09: qty 2
  Filled 2014-11-09: qty 1

## 2014-11-09 MED ORDER — PREGABALIN 75 MG PO CAPS
225.0000 mg | ORAL_CAPSULE | Freq: Two times a day (BID) | ORAL | Status: DC
  Administered 2014-11-09 – 2014-11-10 (×3): 225 mg via ORAL
  Filled 2014-11-09 (×3): qty 3

## 2014-11-09 MED ORDER — HYDROCODONE-ACETAMINOPHEN 5-325 MG PO TABS
1.0000 | ORAL_TABLET | Freq: Once | ORAL | Status: AC
  Administered 2014-11-09: 1 via ORAL
  Filled 2014-11-09: qty 1

## 2014-11-09 MED ORDER — LORAZEPAM 1 MG PO TABS
1.0000 mg | ORAL_TABLET | Freq: Four times a day (QID) | ORAL | Status: DC | PRN
  Administered 2014-11-09: 1 mg via ORAL
  Filled 2014-11-09: qty 1

## 2014-11-09 MED ORDER — POTASSIUM CHLORIDE CRYS ER 20 MEQ PO TBCR
20.0000 meq | EXTENDED_RELEASE_TABLET | Freq: Every day | ORAL | Status: DC
  Administered 2014-11-10 – 2014-11-11 (×2): 20 meq via ORAL
  Filled 2014-11-09 (×3): qty 1

## 2014-11-09 MED ORDER — FENOFIBRATE 160 MG PO TABS
160.0000 mg | ORAL_TABLET | Freq: Every day | ORAL | Status: DC
  Administered 2014-11-10: 160 mg via ORAL
  Filled 2014-11-09 (×2): qty 1

## 2014-11-09 MED ORDER — POTASSIUM CHLORIDE CRYS ER 20 MEQ PO TBCR: 20.0000 meq | EXTENDED_RELEASE_TABLET | Freq: Every day | ORAL | Status: DC

## 2014-11-09 MED ORDER — PIPERACILLIN-TAZOBACTAM 3.375 G IVPB
3.3750 g | Freq: Once | INTRAVENOUS | Status: AC
  Administered 2014-11-09: 3.375 g via INTRAVENOUS
  Filled 2014-11-09: qty 50

## 2014-11-09 MED ORDER — BUDESONIDE-FORMOTEROL FUMARATE 80-4.5 MCG/ACT IN AERO
2.0000 | INHALATION_SPRAY | Freq: Two times a day (BID) | RESPIRATORY_TRACT | Status: DC
  Administered 2014-11-09 – 2014-11-22 (×22): 2 via RESPIRATORY_TRACT
  Filled 2014-11-09 (×2): qty 6.9

## 2014-11-09 MED ORDER — CLONAZEPAM 0.5 MG PO TABS
0.5000 mg | ORAL_TABLET | Freq: Four times a day (QID) | ORAL | Status: DC
  Administered 2014-11-09 – 2014-11-11 (×9): 0.5 mg via ORAL
  Filled 2014-11-09 (×9): qty 1

## 2014-11-09 MED ORDER — PANTOPRAZOLE SODIUM 40 MG PO TBEC: 40.0000 mg | DELAYED_RELEASE_TABLET | Freq: Every day | ORAL | Status: DC

## 2014-11-09 MED ORDER — VITAMIN D3 25 MCG (1000 UNIT) PO TABS
5000.0000 [IU] | ORAL_TABLET | Freq: Every day | ORAL | Status: DC
  Administered 2014-11-10: 5000 [IU] via ORAL
  Filled 2014-11-09 (×2): qty 5

## 2014-11-09 MED ORDER — HYDROCODONE-ACETAMINOPHEN 10-325 MG PO TABS
1.0000 | ORAL_TABLET | Freq: Four times a day (QID) | ORAL | Status: DC | PRN
  Administered 2014-11-09 – 2014-11-12 (×6): 1 via ORAL
  Filled 2014-11-09 (×6): qty 1

## 2014-11-09 MED ORDER — ARIPIPRAZOLE 10 MG PO TABS
10.0000 mg | ORAL_TABLET | Freq: Every day | ORAL | Status: DC
  Administered 2014-11-10 – 2014-11-22 (×12): 10 mg via ORAL
  Filled 2014-11-09 (×14): qty 1

## 2014-11-09 MED ORDER — EMTRICITAB-RILPIVIR-TENOFOV AF 200-25-25 MG PO TABS
1.0000 | ORAL_TABLET | Freq: Every day | ORAL | Status: DC
  Filled 2014-11-09: qty 1

## 2014-11-09 MED ORDER — VITAMIN E 45 MG (100 UNIT) PO CAPS
1000.0000 [IU] | ORAL_CAPSULE | Freq: Every day | ORAL | Status: DC
  Administered 2014-11-10: 1000 [IU] via ORAL
  Filled 2014-11-09: qty 2

## 2014-11-09 MED ORDER — INSULIN ASPART 100 UNIT/ML ~~LOC~~ SOLN
0.0000 [IU] | Freq: Every day | SUBCUTANEOUS | Status: DC
  Administered 2014-11-09: 2 [IU] via SUBCUTANEOUS
  Administered 2014-11-10: 4 [IU] via SUBCUTANEOUS
  Administered 2014-11-11: 2 [IU] via SUBCUTANEOUS
  Administered 2014-11-13: 4 [IU] via SUBCUTANEOUS

## 2014-11-09 MED ORDER — TRAMADOL HCL 50 MG PO TABS
100.0000 mg | ORAL_TABLET | Freq: Four times a day (QID) | ORAL | Status: DC | PRN
  Administered 2014-11-09: 100 mg via ORAL
  Filled 2014-11-09: qty 2

## 2014-11-09 MED ORDER — INSULIN ASPART 100 UNIT/ML ~~LOC~~ SOLN
10.0000 [IU] | Freq: Once | SUBCUTANEOUS | Status: AC
  Administered 2014-11-09: 10 [IU] via SUBCUTANEOUS
  Filled 2014-11-09: qty 1

## 2014-11-09 MED ORDER — PANTOPRAZOLE SODIUM 40 MG PO TBEC
40.0000 mg | DELAYED_RELEASE_TABLET | Freq: Every day | ORAL | Status: DC
  Administered 2014-11-10 – 2014-11-12 (×3): 40 mg via ORAL
  Filled 2014-11-09 (×3): qty 1

## 2014-11-09 MED ORDER — FUROSEMIDE 40 MG PO TABS: 40.0000 mg | ORAL_TABLET | Freq: Every day | ORAL | Status: DC

## 2014-11-09 MED ORDER — METHYLPHENIDATE HCL 5 MG PO TABS: 20.0000 mg | ORAL_TABLET | Freq: Every day | ORAL | Status: DC

## 2014-11-09 MED ORDER — FUROSEMIDE 40 MG PO TABS
40.0000 mg | ORAL_TABLET | Freq: Every day | ORAL | Status: DC
  Administered 2014-11-10: 40 mg via ORAL
  Filled 2014-11-09 (×2): qty 1

## 2014-11-09 MED ORDER — ESCITALOPRAM OXALATE 20 MG PO TABS: 20.0000 mg | ORAL_TABLET | Freq: Every day | ORAL | Status: DC

## 2014-11-09 MED ORDER — METHYLPHENIDATE HCL 20 MG PO TABS
20.0000 mg | ORAL_TABLET | Freq: Every day | ORAL | Status: DC
  Administered 2014-11-10 – 2014-11-14 (×5): 20 mg via ORAL
  Filled 2014-11-09 (×5): qty 4

## 2014-11-09 MED ORDER — VANCOMYCIN HCL IN DEXTROSE 1-5 GM/200ML-% IV SOLN
1000.0000 mg | Freq: Once | INTRAVENOUS | Status: AC
  Administered 2014-11-09: 1000 mg via INTRAVENOUS
  Filled 2014-11-09: qty 200

## 2014-11-09 MED ORDER — ROSUVASTATIN CALCIUM 10 MG PO TABS
20.0000 mg | ORAL_TABLET | Freq: Every day | ORAL | Status: DC
  Administered 2014-11-10 – 2014-11-22 (×12): 20 mg via ORAL
  Filled 2014-11-09: qty 2
  Filled 2014-11-09: qty 1
  Filled 2014-11-09 (×3): qty 2
  Filled 2014-11-09 (×2): qty 1
  Filled 2014-11-09 (×3): qty 2
  Filled 2014-11-09 (×2): qty 1

## 2014-11-09 NOTE — Progress Notes (Signed)
EDCM went to speak to patient at bedside, however, patient was in testing.  Patient's mother Rise Paganini [phone] at bedside.  Rise Paganini reports the patient lives with her.  Rise Paganini reports the patient has had home health services in the past.  Patient lives with her mother.  Patient spent four months at [facility], came home in September.  Patient's mother reports she did not like [facility] and would prefer [facility] if patient needs to go to rehab.  Patient has a wheelchair at home in which the patient spends most of her time per patient's mother.  Patient's mother reports patient can barely walk with a walker because, "she can't lift that leg" referring to patient's left leg.  EDCM provided patient's mother with list of home health agencies in [facility] and explained services.  EDCM assured patient's mother that patient will be evaluated appropriately for correct level of care on discharge and if patient prefers to go home with home health services, she may do so.  Patient's mother thankful for services.  No further EDCM needs at this time.

## 2014-11-09 NOTE — H&P (Signed)
History and Physical  [name] [id number] DOB: [birth year] DOA: 11/09/2014  Referring physician: EDP PCP: Erin Dials, PA-C   Chief Complaint: left foot wound for the last two months  HPI: [name] is a 49 y.o. female  With h/o IDDM2, HIV, obesity presented to [facility] ED due to left foot wound for the last two months, has progressively gotten worse, now with malodorous drainage, denies fever, no significant pain (h/o diabetic neuropathy), she was seen by her endocrinology , was told to come to the ED to have the wound evaluated, she did not come yesterday, but she came today. In the ED , her vital stable, labs showed leukocytosis 14.6  With normal lactic acid, EDP called orthopedic surgery for diabetic foot infection, hospitalist called to admit the patient. She denies fever, no cough, no chest pain, no dysuria, no diarrhea, she reported frequent falls at baseline. Reported multiple left hip surgery this year, she has been mostly wheelchair bound since the surgery.    Review of Systems:  Detail per HPI, Review of systems are otherwise negative  Past Medical History  Diagnosis Date  . Anxiety   . Depression   . Hypertension   . HIV infection ([place]) 2000  . Asthma     HOSPITALIZED WITH EXCERBATION OF ASTHMA - AND BRONCHITIS AND THE FLU DEC 2013  . Shortness of breath     ALLERGIES ARE "ACTING UP"  . Diabetes mellitus     ON INSULIN AND ORAL MEDICATIONS  . GERD (gastroesophageal reflux disease)   . Neuropathy     NEUROPATHY HANDS AND FEET  . Arthritis     LEFT HIP  . Pain     SEVERE PAIN LEFT HIP - HX OF LEFT HIP PINNING JULY 2013   Past Surgical History  Procedure Laterality Date  . Hip pinning,cannulated  07/16/2011    Procedure: CANNULATED HIP PINNING;  Surgeon: Gearlean Alf, MD;  Location: [facility];  Service: Orthopedics;  Laterality: Left;  . Hip pinning,cannulated Left 04/09/2012    Procedure: CANNULATED HIP PINNING AND HARDWARE REVISION;  Surgeon: Gearlean Alf, MD;  Location: [facility];  Service: Orthopedics;  Laterality: Left;  . Hip arthroplasty Left 05/05/2014    Procedure: ARTHROPLASTY OPEN REDUCTION INTERNAL FIXATION LEFT HIP AND REMOVAL OF TWO CANNULATED SCREW;  Surgeon: Latanya Maudlin, MD;  Location: [facility];  Service: Orthopedics;  Laterality: Left;  . Cast application Left [id number]    Procedure: CAST APPLICATION (FIBERGLASS);  Surgeon: Latanya Maudlin, MD;  Location: [facility];  Service: Orthopedics;  Laterality: Left;  CLOSED REDUCTION OF LEFT COLLES FRACTURE WITH SHORT ARM CAST  . Hardware removal Left 05/05/2014    Procedure: HARDWARE REMOVAL LEFT HIP;  Surgeon: Latanya Maudlin, MD;  Location: [facility];  Service: Orthopedics;  Laterality: Left;  REMOVAL BIOMET 6.5-8.0 CANNULATED SCREW   Social History:  reports that she has been smoking E-cigarettes.  She has a 17.5 pack-year smoking history. She has never used smokeless tobacco. She reports that she does not drink alcohol or use illicit drugs. Patient lives at home with her mother & wheel chair bound since left hip surgery in 05/2014, able to take a few steps with a walker at baseline since 05/2014. She quit smoking cigarette in 08/2014, now using E cigarettes.  Allergies  Allergen Reactions  . Cortizone-10 [Hydrocortisone] Rash    Per patient, given local injection at knee and developed rash at local site.     Family History  Problem Relation Age  of Onset  . Diabetes Mother   . Hypertension Mother   . Vision loss Mother   . Heart disease Mother   . Hypertension Father   . Thyroid disease Neg Hx       Prior to Admission medications   Medication Sig Start Date End Date Taking? Authorizing Provider  ARIPiprazole (ABILIFY) 10 MG tablet Take 10 mg by mouth daily. 10/18/14  Yes Historical Provider, MD  atenolol (TENORMIN) 25 MG tablet Take 25 mg by mouth every morning.    Yes Historical Provider, MD  budesonide-formoterol (SYMBICORT) 80-4.5 MCG/ACT inhaler Inhale 2 puffs into the lungs 2  (two) times daily. 01/01/12  Yes Hosie Poisson, MD  Cholecalciferol (VITAMIN D3) 5000 UNITS TABS Take 5,000 Units by mouth daily.    Yes Historical Provider, MD  clonazePAM (KLONOPIN) 0.5 MG tablet Take 0.5 mg by mouth 4 (four) times daily.   Yes Historical Provider, MD  cyclobenzaprine (FLEXERIL) 10 MG tablet Take 10 mg by mouth 2 (two) times daily as needed for muscle spasms.  10/18/14  Yes Historical Provider, MD  escitalopram (LEXAPRO) 20 MG tablet Take 20 mg by mouth daily.   Yes Historical Provider, MD  eszopiclone (LUNESTA) 2 MG TABS tablet Take 3 mg by mouth at bedtime as needed for sleep. Take immediately before bedtime   Yes Historical Provider, MD  fenofibrate (TRICOR) 145 MG tablet Take 1 tablet (145 mg total) by mouth daily. 01/06/13  Yes Campbell Riches, MD  furosemide (LASIX) 40 MG tablet Take 40 mg by mouth daily. 09/02/14  Yes Historical Provider, MD  HYDROcodone-acetaminophen (NORCO) 10-325 MG per tablet Take 1 tablet by mouth every 6 (six) hours as needed. Patient taking differently: Take 1 tablet by mouth every 6 (six) hours as needed for moderate pain or severe pain.  08/20/14  Yes Campbell Riches, MD  insulin aspart (NOVOLOG) 100 UNIT/ML injection Inject 0-10 Units into the skin 2 (two) times daily. Per sliding scale:  151-200 =  2 201-250 =  3 251-300 =  4 301-350 =  6 351-400 =  8 401+      = 10 units and call provider.   Yes Historical Provider, MD  Insulin Glargine (TOUJEO SOLOSTAR) 300 UNIT/ML SOPN Inject 70 Units into the skin daily.   Yes Historical Provider, MD  methocarbamol (ROBAXIN) 500 MG tablet Take 1 tablet (500 mg total) by mouth every 6 (six) hours as needed for muscle spasms. 07/09/14  Yes Cecilie Kicks, PA-C  methylphenidate (RITALIN) 20 MG tablet Take 20 mg by mouth daily. 10/18/14  Yes Historical Provider, MD  Multiple Vitamins-Minerals (MULTIVITAMIN WITH MINERALS) tablet Take 1 tablet by mouth daily.   Yes Historical Provider, MD  ODEFSEY 200-25-25 MG  TABS tablet TAKE 1 TABLET BY MOUTH DAILY WITH BREAKFAST 11/09/14  Yes Campbell Riches, MD  pantoprazole (PROTONIX) 40 MG tablet Take 40 mg by mouth daily. 09/02/14  Yes Historical Provider, MD  potassium chloride SA (K-DUR,KLOR-CON) 20 MEQ tablet Take 20 mEq by mouth daily. 09/02/14  Yes Historical Provider, MD  pregabalin (LYRICA) 225 MG capsule Take 225 mg by mouth 2 (two) times daily.   Yes Historical Provider, MD  rosuvastatin (CRESTOR) 20 MG tablet Take 20 mg by mouth daily. 09/02/14  Yes Historical Provider, MD  traMADol (ULTRAM) 50 MG tablet Take 100 mg by mouth every 6 (six) hours as needed for moderate pain or severe pain.  10/08/14  Yes Historical Provider, MD  vitamin E (VITAMIN E) 1000 UNIT  capsule Take 1,000 Units by mouth daily.    Yes Historical Provider, MD  docusate sodium (COLACE) 100 MG capsule Take 1 capsule (100 mg total) by mouth 2 (two) times daily. Patient not taking: Reported on 11/08/2014 07/09/14   Cecilie Kicks, PA-C  feeding supplement, GLUCERNA SHAKE, (GLUCERNA SHAKE) LIQD Take 237 mLs by mouth 3 (three) times daily between meals. Patient not taking: Reported on 11/09/2014 05/11/14   Velvet Bathe, MD  insulin glargine (LANTUS) 100 UNIT/ML injection Inject 0.35 mLs (35 Units total) into the skin daily. Patient not taking: Reported on 11/08/2014 10/20/14   Campbell Riches, MD    Physical Exam: BP 112/68 mmHg  Pulse 80  Temp(Src) 99.8 F (37.7 C) (Oral)  Resp 18  SpO2 99%  LMP 11/16/2010  General:  Obese, NAD Eyes: PERRL ENT: unremarkable Neck: supple, no JVD Cardiovascular: RRR Respiratory: CTABL Abdomen: soft/ND/ND, positive bowel sounds Skin: see below Musculoskeletal:  Significant erythema/edema, skin breakdown, malodorous drainage, left dorsal mid foot and left heal Psychiatric: calm/cooperative Neurologic: no focal findings            Labs on Admission:  Basic Metabolic Panel:  Recent Labs Lab 11/09/14 1610  NA 130*  K 4.3  CL 95*  CO2 25    GLUCOSE 447*  BUN 32*  CREATININE 1.99*  CALCIUM 9.4   Liver Function Tests: No results for input(s): AST, ALT, ALKPHOS, BILITOT, PROT, ALBUMIN in the last 168 hours. No results for input(s): LIPASE, AMYLASE in the last 168 hours. No results for input(s): AMMONIA in the last 168 hours. CBC:  Recent Labs Lab 11/09/14 1610  WBC 14.6*  NEUTROABS 12.5*  HGB 9.5*  HCT 29.7*  MCV 83.7  PLT 487*   Cardiac Enzymes: No results for input(s): CKTOTAL, CKMB, CKMBINDEX, TROPONINI in the last 168 hours.  BNP (last 3 results) No results for input(s): BNP in the last 8760 hours.  ProBNP (last 3 results) No results for input(s): PROBNP in the last 8760 hours.  CBG: No results for input(s): GLUCAP in the last 168 hours.  Radiological Exams on Admission: Dg Foot 2 Views Left  11/09/2014  CLINICAL DATA:  Diabetic foot ulcer on the dorsum of the foot and ankle. EXAM: LEFT FOOT - 2 VIEW COMPARISON:  None. FINDINGS: There is an open wound on the dorsum of the midfoot with extensive subcutaneous emphysema. No obvious destructive bony changes to suggest osteomyelitis. No destructive joint changes to suggest septic arthritis. There is chronic appearing AVN of the second metatarsal head (Freiberg's infraction). IMPRESSION: 1. Open wound on the dorsum of the midfoot with subcutaneous emphysema. 2. No definite plain film findings to suggest septic arthritis or osteomyelitis 3. Chronic AVN of the second metatarsal head. Electronically Signed   By: Marijo Sanes M.D.   On: 11/09/2014 16:20    EKG: Independently reviewed. NSR, no acute ST/T changes  Assessment/Plan Present on Admission:  . Diabetic foot infection ([place])  Diabetic foot infection(left): MRI pending, continue vanc/zosyn, ortho consulted.  IDDM2, check a1c, continue insulin, adjust prn  Hyponatremia, likely due to elevated blood glucose, repeat lab in am  CKD III, at baseline,   HIV, stable per recent ID note, continue home  meds.   DVT prophylaxis: lovenox  Consultants: orthopedic  Code Status: full   Family Communication:  Patient and her mother in room  Disposition Plan: admit to med tele  Time spent: 49mins  Eshan Trupiano MD, PhD [facility] Pager [phone] If 7PM-7AM, please contact night-coverage at  [email or web address], password [email or web address]

## 2014-11-09 NOTE — ED Notes (Signed)
Pt states she has wound on top of left foot, states it started as a little blister x 2 months. Pt is diabetic.

## 2014-11-09 NOTE — ED Provider Notes (Signed)
CSN: [id number]     Arrival date & time 11/09/14  1413 History   First MD Initiated Contact with Patient 11/09/14 1500     Chief Complaint  Patient presents with  . Wound Infection     (Consider location/radiation/quality/duration/timing/severity/associated sxs/prior Treatment) HPI Comments: Pt is a 49 yo female with PMH of DM (on insulin), HTN and HIV who presents to the ED with complaint of left foot wound. Pt reports she has had a wound to her left foot for the past 3 months and states that it initially started out as a blister. Pt states the wound has increased in size since onset. She notes she and her mother have been using zinc oxide ointment and a rx SSD ointment that her mother had at home. She notes she noticed the wound become significantly worse 5 days ago and reports having malodorous brown drainage. Endorses associated swelling and redness to the area. Pt was seen by her Endocrinologist yesterday and was advised to come to the ED for admission for IV antibiotics and further management of her wound. Pt denies fever, chills, SOB, CP, abdominal pain, N/V/D, urinary sxs, numbness (but endorses unchanged diabetic neuropathy), tingling, weakness.    Past Medical History  Diagnosis Date  . Anxiety   . Depression   . Hypertension   . HIV infection ([place]) 2000  . Asthma     HOSPITALIZED WITH EXCERBATION OF ASTHMA - AND BRONCHITIS AND THE FLU DEC 2013  . Shortness of breath     ALLERGIES ARE "ACTING UP"  . Diabetes mellitus     ON INSULIN AND ORAL MEDICATIONS  . GERD (gastroesophageal reflux disease)   . Neuropathy     NEUROPATHY HANDS AND FEET  . Arthritis     LEFT HIP  . Pain     SEVERE PAIN LEFT HIP - HX OF LEFT HIP PINNING JULY 2013   Past Surgical History  Procedure Laterality Date  . Hip pinning,cannulated  07/16/2011    Procedure: CANNULATED HIP PINNING;  Surgeon: Gearlean Alf, MD;  [facility]: [facility];  Service: Orthopedics;  Laterality: Left;  . Hip pinning,cannulated  Left 04/09/2012    Procedure: CANNULATED HIP PINNING AND HARDWARE REVISION;  Surgeon: Gearlean Alf, MD;  Location: [facility];  Service: Orthopedics;  Laterality: Left;  . Hip arthroplasty Left 05/05/2014    Procedure: ARTHROPLASTY OPEN REDUCTION INTERNAL FIXATION LEFT HIP AND REMOVAL OF TWO CANNULATED SCREW;  Surgeon: Latanya Maudlin, MD;  Location: [facility];  Service: Orthopedics;  Laterality: Left;  . Cast application Left [id number]    Procedure: CAST APPLICATION (FIBERGLASS);  Surgeon: Latanya Maudlin, MD;  [facility]: [facility];  Service: Orthopedics;  Laterality: Left;  CLOSED REDUCTION OF LEFT COLLES FRACTURE WITH SHORT ARM CAST  . Hardware removal Left 05/05/2014    Procedure: HARDWARE REMOVAL LEFT HIP;  Surgeon: Latanya Maudlin, MD;  [facility]: [facility];  Service: Orthopedics;  Laterality: Left;  REMOVAL BIOMET 6.5-8.0 CANNULATED SCREW   Family History  Problem Relation Age of Onset  . Diabetes Mother   . Hypertension Mother   . Vision loss Mother   . Heart disease Mother   . Hypertension Father   . Thyroid disease Neg Hx    Social History  Substance Use Topics  . Smoking status: Current Every Day Smoker -- 0.50 packs/day for 35 years    Types: E-cigarettes  . Smokeless tobacco: Never Used  . Alcohol Use: No     Comment: no drink since 2008   OB  History    Gravida Para Term Preterm AB TAB SAB Ectopic Multiple Living   2 1 1  0 1  1 0 0 1     Review of Systems  Musculoskeletal: Positive for joint swelling.  Skin: Positive for wound.  All other systems reviewed and are negative.     Allergies  Cortizone-10  Home Medications   Prior to Admission medications   Medication Sig Start Date End Date Taking? Authorizing Provider  ARIPiprazole (ABILIFY) 10 MG tablet Take 10 mg by mouth daily. 10/18/14  Yes Historical Provider, MD  atenolol (TENORMIN) 25 MG tablet Take 25 mg by mouth every morning.    Yes Historical Provider, MD  budesonide-formoterol (SYMBICORT) 80-4.5 MCG/ACT inhaler Inhale  2 puffs into the lungs 2 (two) times daily. 01/01/12  Yes Hosie Poisson, MD  Cholecalciferol (VITAMIN D3) 5000 UNITS TABS Take 5,000 Units by mouth daily.    Yes Historical Provider, MD  clonazePAM (KLONOPIN) 0.5 MG tablet Take 0.5 mg by mouth 4 (four) times daily.   Yes Historical Provider, MD  cyclobenzaprine (FLEXERIL) 10 MG tablet Take 10 mg by mouth 2 (two) times daily as needed for muscle spasms.  10/18/14  Yes Historical Provider, MD  escitalopram (LEXAPRO) 20 MG tablet Take 20 mg by mouth daily.   Yes Historical Provider, MD  eszopiclone (LUNESTA) 2 MG TABS tablet Take 3 mg by mouth at bedtime as needed for sleep. Take immediately before bedtime   Yes Historical Provider, MD  fenofibrate (TRICOR) 145 MG tablet Take 1 tablet (145 mg total) by mouth daily. 01/06/13  Yes Campbell Riches, MD  furosemide (LASIX) 40 MG tablet Take 40 mg by mouth daily. 09/02/14  Yes Historical Provider, MD  HYDROcodone-acetaminophen (NORCO) 10-325 MG per tablet Take 1 tablet by mouth every 6 (six) hours as needed. Patient taking differently: Take 1 tablet by mouth every 6 (six) hours as needed for moderate pain or severe pain.  08/20/14  Yes Campbell Riches, MD  insulin aspart (NOVOLOG) 100 UNIT/ML injection Inject 0-10 Units into the skin 2 (two) times daily. Per sliding scale:  151-200 =  2 201-250 =  3 251-300 =  4 301-350 =  6 351-400 =  8 401+      = 10 units and call provider.   Yes Historical Provider, MD  Insulin Glargine (TOUJEO SOLOSTAR) 300 UNIT/ML SOPN Inject 70 Units into the skin daily.   Yes Historical Provider, MD  methocarbamol (ROBAXIN) 500 MG tablet Take 1 tablet (500 mg total) by mouth every 6 (six) hours as needed for muscle spasms. 07/09/14  Yes Cecilie Kicks, PA-C  methylphenidate (RITALIN) 20 MG tablet Take 20 mg by mouth daily. 10/18/14  Yes Historical Provider, MD  Multiple Vitamins-Minerals (MULTIVITAMIN WITH MINERALS) tablet Take 1 tablet by mouth daily.   Yes Historical Provider, MD    ODEFSEY 200-25-25 MG TABS tablet TAKE 1 TABLET BY MOUTH DAILY WITH BREAKFAST 11/09/14  Yes Campbell Riches, MD  pantoprazole (PROTONIX) 40 MG tablet Take 40 mg by mouth daily. 09/02/14  Yes Historical Provider, MD  potassium chloride SA (K-DUR,KLOR-CON) 20 MEQ tablet Take 20 mEq by mouth daily. 09/02/14  Yes Historical Provider, MD  pregabalin (LYRICA) 225 MG capsule Take 225 mg by mouth 2 (two) times daily.   Yes Historical Provider, MD  rosuvastatin (CRESTOR) 20 MG tablet Take 20 mg by mouth daily. 09/02/14  Yes Historical Provider, MD  traMADol (ULTRAM) 50 MG tablet Take 100 mg by mouth every 6 (  six) hours as needed for moderate pain or severe pain.  10/08/14  Yes Historical Provider, MD  vitamin E (VITAMIN E) 1000 UNIT capsule Take 1,000 Units by mouth daily.    Yes Historical Provider, MD  docusate sodium (COLACE) 100 MG capsule Take 1 capsule (100 mg total) by mouth 2 (two) times daily. Patient not taking: Reported on 11/08/2014 07/09/14   Cecilie Kicks, PA-C  feeding supplement, GLUCERNA SHAKE, (GLUCERNA SHAKE) LIQD Take 237 mLs by mouth 3 (three) times daily between meals. Patient not taking: Reported on 11/09/2014 05/11/14   Velvet Bathe, MD  insulin glargine (LANTUS) 100 UNIT/ML injection Inject 0.35 mLs (35 Units total) into the skin daily. Patient not taking: Reported on 11/08/2014 10/20/14   Campbell Riches, MD   BP 112/68 mmHg  Pulse 80  Temp(Src) 99.8 F (37.7 C) (Oral)  Resp 18  SpO2 99%  LMP 11/16/2010 Physical Exam  Constitutional: She is oriented to person, place, and time. She appears well-developed and well-nourished. No distress.  HENT:  Head: Normocephalic and atraumatic.  Mouth/Throat: Oropharynx is clear and moist. No oropharyngeal exudate.  Eyes: Conjunctivae and EOM are normal. Right eye exhibits no discharge. Left eye exhibits no discharge. No scleral icterus.  Neck: Normal range of motion. Neck supple.  Cardiovascular: Normal rate, regular rhythm, normal heart  sounds and intact distal pulses.   Pulmonary/Chest: Effort normal and breath sounds normal. No respiratory distress. She has no wheezes. She has no rales. She exhibits no tenderness.  Abdominal: Soft. Bowel sounds are normal. She exhibits no distension and no mass. There is no tenderness. There is no rebound and no guarding.  Musculoskeletal: Normal range of motion. She exhibits edema and tenderness.       Left foot: There is tenderness and swelling. There is normal range of motion, no bony tenderness, no crepitus, no deformity and no laceration.  Lymphadenopathy:    She has no cervical adenopathy.  Neurological: She is alert and oriented to person, place, and time.  Skin: Skin is warm and dry. She is not diaphoretic.  Swelling and redness noted to left proximal dorsal foot with warmth extending up to left ankle. Left dorsal foot with wound with loss of epidermal tissue, malodorous and yellow discoloration. Unable to palpate pedal pulse due to swelling. Smaller wound noted to left lateral heel with loss of epidermal tissue loss and yellow/white discoloration. Decreased sensation to light touch noted to bilateral feet.  Nursing note and vitals reviewed.   ED Course  Procedures (including critical care time) Labs Review Labs Reviewed  CBC WITH DIFFERENTIAL/PLATELET - Abnormal; Notable for the following:    WBC 14.6 (*)    RBC 3.55 (*)    Hemoglobin 9.5 (*)    HCT 29.7 (*)    RDW 16.9 (*)    Platelets 487 (*)    Neutro Abs 12.5 (*)    All other components within normal limits  BASIC METABOLIC PANEL - Abnormal; Notable for the following:    Sodium 130 (*)    Chloride 95 (*)    Glucose, Bld 447 (*)    BUN 32 (*)    Creatinine, Ser 1.99 (*)    GFR calc non Af Amer 28 (*)    GFR calc Af Amer 33 (*)    All other components within normal limits  URINALYSIS, ROUTINE W REFLEX MICROSCOPIC (NOT AT [facility]) - Abnormal; Notable for the following:    Glucose, UA >1000 (*)    Hgb urine dipstick  TRACE (*)    All other components within normal limits  URINE MICROSCOPIC-ADD ON  I-STAT CG4 LACTIC ACID, ED    Imaging Review Dg Foot 2 Views Left  11/09/2014  CLINICAL DATA:  Diabetic foot ulcer on the dorsum of the foot and ankle. EXAM: LEFT FOOT - 2 VIEW COMPARISON:  None. FINDINGS: There is an open wound on the dorsum of the midfoot with extensive subcutaneous emphysema. No obvious destructive bony changes to suggest osteomyelitis. No destructive joint changes to suggest septic arthritis. There is chronic appearing AVN of the second metatarsal head (Freiberg's infraction). IMPRESSION: 1. Open wound on the dorsum of the midfoot with subcutaneous emphysema. 2. No definite plain film findings to suggest septic arthritis or osteomyelitis 3. Chronic AVN of the second metatarsal head. Electronically Signed   By: Marijo Sanes M.D.   On: 11/09/2014 16:20   I have personally reviewed and evaluated these images and lab results as part of my medical decision-making.  Filed Vitals:   11/09/14 1730  BP: 112/68  Pulse: 80  Temp: 99.8 F (37.7 C)  Resp: 18     MDM   Final diagnoses:  Wound, open, foot    Patient presents with left foot wound. History of type 2 diabetes on insulin and HIV. Denies fever. Endorses malodorous drainage. Temp 99.8. Exam revealed malodorous wound to left dorsal midfoot with epidermal tissue loss and yellow discoloration, surrounding swelling and erythema noted. Decreased sensation in bilateral feet. Patient given pain meds. Glucose 447. Patient given 10 units of NovoLog. WBC 14.6. Lactic acid 1.37. Foot x-ray revealed open wound with subcutaneous emphysema, no findings to suggest osteomyelitis. Patient started on IV Vanco and Zosyn. Consultation hospitalist who agrees to admission, orders placed for foot MR. Consulted ortho who state they will follow the pt during his admission. Discussed results and plan for admission with patient.          Chesley Noon  [place], [place] 11/10/14 0126  Lacretia Leigh, MD 11/11/14 [phone]

## 2014-11-09 NOTE — ED Notes (Signed)
Patient transported to MRI 

## 2014-11-09 NOTE — ED Notes (Signed)
Tried to call report to [employer] [facility] but the nurses are in shift change at this time.

## 2014-11-09 NOTE — Consult Note (Signed)
Reason for Consult:Left foot infection Referring Physician: Dr. Renae Gloss [name] is an 49 y.o. female.  HPI: 49 yo female with diabetes, HTN, HIV, Asthma who has a several day to week history of ulcerations on her left foot. She denies any injury leading to these. She states it started on her left heel and she peeled off skin and it got better before ulcerating. The bigger problem was the development of a large blister on the dorsum of her left foot which she peeled off and has gotten progressively worse. She denies fever, chills or systemic symptoms, but has had drainage from the dorsum of her foot. Her mother has been applying Sulfadene to the area but it is worsening. She saw her endocrinologist yesterday and was told to go to the ED. She showed up today and was admitted to the medical service. We were consulted for management  Past Medical History  Diagnosis Date  . Anxiety   . Depression   . Hypertension   . HIV infection ([place]) 2000  . Asthma     HOSPITALIZED WITH EXCERBATION OF ASTHMA - AND BRONCHITIS AND THE FLU DEC 2013  . Shortness of breath     ALLERGIES ARE "ACTING UP"  . Diabetes mellitus     ON INSULIN AND ORAL MEDICATIONS  . GERD (gastroesophageal reflux disease)   . Neuropathy     NEUROPATHY HANDS AND FEET  . Arthritis     LEFT HIP  . Pain     SEVERE PAIN LEFT HIP - HX OF LEFT HIP PINNING JULY 2013    Past Surgical History  Procedure Laterality Date  . Hip pinning,cannulated  07/16/2011    Procedure: CANNULATED HIP PINNING;  Surgeon: Gearlean Alf, MD;  Location: [facility];  Service: Orthopedics;  Laterality: Left;  . Hip pinning,cannulated Left 04/09/2012    Procedure: CANNULATED HIP PINNING AND HARDWARE REVISION;  Surgeon: Gearlean Alf, MD;  Location: [facility];  Service: Orthopedics;  Laterality: Left;  . Hip arthroplasty Left 05/05/2014    Procedure: ARTHROPLASTY OPEN REDUCTION INTERNAL FIXATION LEFT HIP AND REMOVAL OF TWO CANNULATED SCREW;  Surgeon: Latanya Maudlin, MD;  Location: [facility];  Service: Orthopedics;  Laterality: Left;  . Cast application Left 02/02/1939    Procedure: CAST APPLICATION (FIBERGLASS);  Surgeon: Latanya Maudlin, MD;  Location: [facility];  Service: Orthopedics;  Laterality: Left;  CLOSED REDUCTION OF LEFT COLLES FRACTURE WITH SHORT ARM CAST  . Hardware removal Left 05/05/2014    Procedure: HARDWARE REMOVAL LEFT HIP;  Surgeon: Latanya Maudlin, MD;  Location: [facility];  Service: Orthopedics;  Laterality: Left;  REMOVAL BIOMET 6.5-8.0 CANNULATED SCREW    Family History  Problem Relation Age of Onset  . Diabetes Mother   . Hypertension Mother   . Vision loss Mother   . Heart disease Mother   . Hypertension Father   . Thyroid disease Neg Hx     Social History:  reports that she has been smoking E-cigarettes.  She has a 17.5 pack-year smoking history. She has never used smokeless tobacco. She reports that she does not drink alcohol or use illicit drugs.  Allergies:  Allergies  Allergen Reactions  . Cortizone-10 [Hydrocortisone] Rash    Per patient, given local injection at knee and developed rash at local site.     Medications: Erin have reviewed the patient's current medications.  Results for orders placed or performed during the hospital encounter of 11/09/14 (from the past 48 hour(s))  CBC with Differential  Status: Abnormal   Collection Time: 11/09/14  4:10 PM  Result Value Ref Range   WBC 14.6 (H) 4.0 - 10.5 K/uL   RBC 3.55 (L) 3.87 - 5.11 MIL/uL   Hemoglobin 9.5 (L) 12.0 - 15.0 g/dL   HCT 29.7 (L) 36.0 - 46.0 %   MCV 83.7 78.0 - 100.0 fL   MCH 26.8 26.0 - 34.0 pg   MCHC 32.0 30.0 - 36.0 g/dL   RDW 16.9 (H) 11.5 - 15.5 %   Platelets 487 (H) 150 - 400 K/uL   Neutrophils Relative % 85 %   Lymphocytes Relative 9 %   Monocytes Relative 5 %   Eosinophils Relative 1 %   Basophils Relative 0 %   Neutro Abs 12.5 (H) 1.7 - 7.7 K/uL   Lymphs Abs 1.3 0.7 - 4.0 K/uL   Monocytes Absolute 0.7 0.1 - 1.0 K/uL   Eosinophils  Absolute 0.1 0.0 - 0.7 K/uL   Basophils Absolute 0.0 0.0 - 0.1 K/uL   RBC Morphology TARGET CELLS     Comment: POLYCHROMASIA PRESENT   Smear Review LARGE PLATELETS PRESENT   Basic metabolic panel     Status: Abnormal   Collection Time: 11/09/14  4:10 PM  Result Value Ref Range   Sodium 130 (L) 135 - 145 mmol/L   Potassium 4.3 3.5 - 5.1 mmol/L   Chloride 95 (L) 101 - 111 mmol/L   CO2 25 22 - 32 mmol/L   Glucose, Bld 447 (H) 65 - 99 mg/dL   BUN 32 (H) 6 - 20 mg/dL   Creatinine, Ser 1.99 (H) 0.44 - 1.00 mg/dL   Calcium 9.4 8.9 - 10.3 mg/dL   GFR calc non Af Amer 28 (L) >60 mL/min   GFR calc Af Amer 33 (L) >60 mL/min    Comment: (NOTE) The eGFR has been calculated using the CKD EPI equation. This calculation has not been validated in all clinical situations. eGFR's persistently <60 mL/min signify possible Chronic Kidney Disease.    Anion gap 10 5 - 15  Urinalysis, Routine w reflex microscopic     Status: Abnormal   Collection Time: 11/09/14  5:27 PM  Result Value Ref Range   Color, Urine YELLOW YELLOW   APPearance CLEAR CLEAR   Specific Gravity, Urine 1.018 1.005 - 1.030   pH 5.5 5.0 - 8.0   Glucose, UA >1000 (A) NEGATIVE mg/dL   Hgb urine dipstick TRACE (A) NEGATIVE   Bilirubin Urine NEGATIVE NEGATIVE   Ketones, ur NEGATIVE NEGATIVE mg/dL   Protein, ur NEGATIVE NEGATIVE mg/dL   Urobilinogen, UA 0.2 0.0 - 1.0 mg/dL   Nitrite NEGATIVE NEGATIVE   Leukocytes, UA NEGATIVE NEGATIVE  Urine microscopic-add on     Status: None   Collection Time: 11/09/14  5:27 PM  Result Value Ref Range   Squamous Epithelial / LPF RARE RARE   RBC / HPF 0-2 <3 RBC/hpf  Erin-Stat CG4 Lactic Acid, ED     Status: None   Collection Time: 11/09/14  5:36 PM  Result Value Ref Range   Lactic Acid, Venous 1.37 0.5 - 2.0 mmol/L    Dg Foot 2 Views Left  11/09/2014  CLINICAL DATA:  Diabetic foot ulcer on the dorsum of the foot and ankle. EXAM: LEFT FOOT - 2 VIEW COMPARISON:  None. FINDINGS: There is an open  wound on the dorsum of the midfoot with extensive subcutaneous emphysema. No obvious destructive bony changes to suggest osteomyelitis. No destructive joint changes to suggest septic arthritis. There  is chronic appearing AVN of the second metatarsal head (Freiberg's infraction). IMPRESSION: 1. Open wound on the dorsum of the midfoot with subcutaneous emphysema. 2. No definite plain film findings to suggest septic arthritis or osteomyelitis 3. Chronic AVN of the second metatarsal head. Electronically Signed   By: Marijo Sanes M.D.   On: 11/09/2014 16:20    ROS Blood pressure 121/50, pulse 67, temperature 98.2 F (36.8 C), temperature source Oral, resp. rate 20, last menstrual period 11/16/2010, SpO2 100 %. Physical Exam  Left foot- Almost entire dorsum of foot is sloughed off superficially with malodorous tissue covering the tendons and purulent drainage. The surrounding tissue shows minimal erythema, no crepitance and no drainage. She also has a 2 x 3 cm ulceration on her left heel that is surrounded by healthy tissue and is not draining. Cap refill is normal left foot; toes are pink and warm  Awaiting MRI results  Assessment/Plan: Left foot ulcerations- She has serious soft tissue defect on the dorsum of her foot which may not be reconstructable. There is associated soft tissue infection. Continue antibiotics and Erin will ask our foot and ankle specialist Dr. Doran Durand to see her and provide an opinion. Erin am not very optimistic that her foot can be salvaged and have told her that she may potentially require an amputation.   Gearlean Alf 11/09/2014, 8:41 PM

## 2014-11-10 ENCOUNTER — Encounter (HOSPITAL_COMMUNITY): Payer: Self-pay | Admitting: *Deleted

## 2014-11-10 DIAGNOSIS — E1165 Type 2 diabetes mellitus with hyperglycemia: Secondary | ICD-10-CM

## 2014-11-10 DIAGNOSIS — I10 Essential (primary) hypertension: Secondary | ICD-10-CM

## 2014-11-10 DIAGNOSIS — N184 Chronic kidney disease, stage 4 (severe): Secondary | ICD-10-CM | POA: Diagnosis present

## 2014-11-10 DIAGNOSIS — F39 Unspecified mood [affective] disorder: Secondary | ICD-10-CM

## 2014-11-10 DIAGNOSIS — I5189 Other ill-defined heart diseases: Secondary | ICD-10-CM | POA: Diagnosis present

## 2014-11-10 DIAGNOSIS — E785 Hyperlipidemia, unspecified: Secondary | ICD-10-CM

## 2014-11-10 DIAGNOSIS — E1140 Type 2 diabetes mellitus with diabetic neuropathy, unspecified: Secondary | ICD-10-CM

## 2014-11-10 DIAGNOSIS — E1142 Type 2 diabetes mellitus with diabetic polyneuropathy: Secondary | ICD-10-CM

## 2014-11-10 DIAGNOSIS — I519 Heart disease, unspecified: Secondary | ICD-10-CM

## 2014-11-10 DIAGNOSIS — E118 Type 2 diabetes mellitus with unspecified complications: Secondary | ICD-10-CM

## 2014-11-10 DIAGNOSIS — B20 Human immunodeficiency virus [HIV] disease: Secondary | ICD-10-CM

## 2014-11-10 HISTORY — DX: Chronic kidney disease, stage 4 (severe): N18.4

## 2014-11-10 HISTORY — DX: Other ill-defined heart diseases: I51.89

## 2014-11-10 HISTORY — DX: Type 2 diabetes mellitus with diabetic neuropathy, unspecified: E11.40

## 2014-11-10 LAB — CBC
HEMATOCRIT: 31.3 % — AB (ref 36.0–46.0)
HEMOGLOBIN: 9.6 g/dL — AB (ref 12.0–15.0)
MCH: 26.3 pg (ref 26.0–34.0)
MCHC: 30.7 g/dL (ref 30.0–36.0)
MCV: 85.8 fL (ref 78.0–100.0)
PLATELETS: 478 10*3/uL — AB (ref 150–400)
RBC: 3.65 MIL/uL — AB (ref 3.87–5.11)
RDW: 17.3 % — ABNORMAL HIGH (ref 11.5–15.5)
WBC: 11.9 10*3/uL — AB (ref 4.0–10.5)

## 2014-11-10 LAB — COMPREHENSIVE METABOLIC PANEL
ALK PHOS: 102 U/L (ref 38–126)
ALT: 11 U/L — AB (ref 14–54)
ANION GAP: 11 (ref 5–15)
AST: 13 U/L — AB (ref 15–41)
Albumin: 2.6 g/dL — ABNORMAL LOW (ref 3.5–5.0)
BUN: 30 mg/dL — AB (ref 6–20)
CHLORIDE: 94 mmol/L — AB (ref 101–111)
CO2: 30 mmol/L (ref 22–32)
Calcium: 9.7 mg/dL (ref 8.9–10.3)
Creatinine, Ser: 2 mg/dL — ABNORMAL HIGH (ref 0.44–1.00)
GFR calc Af Amer: 32 mL/min — ABNORMAL LOW (ref >60)
GFR, EST NON AFRICAN AMERICAN: 28 mL/min — AB (ref >60)
GLUCOSE: 158 mg/dL — AB (ref 65–99)
POTASSIUM: 3.5 mmol/L (ref 3.5–5.1)
Sodium: 135 mmol/L (ref 135–145)
Total Bilirubin: 0.6 mg/dL (ref 0.3–1.2)
Total Protein: 7.8 g/dL (ref 6.5–8.1)

## 2014-11-10 LAB — MRSA PCR SCREENING: MRSA BY PCR: NEGATIVE

## 2014-11-10 LAB — PROTIME-INR
INR: 1.27 (ref 0.00–1.49)
PROTHROMBIN TIME: 16.1 s — AB (ref 11.6–15.2)

## 2014-11-10 LAB — GLUCOSE, CAPILLARY
Glucose-Capillary: 126 mg/dL — ABNORMAL HIGH (ref 65–99)
Glucose-Capillary: 155 mg/dL — ABNORMAL HIGH (ref 65–99)
Glucose-Capillary: 277 mg/dL — ABNORMAL HIGH (ref 65–99)
Glucose-Capillary: 314 mg/dL — ABNORMAL HIGH (ref 65–99)

## 2014-11-10 LAB — TSH: TSH: 0.843 u[IU]/mL (ref 0.350–4.500)

## 2014-11-10 MED ORDER — EMTRICITABINE-TENOFOVIR AF 200-25 MG PO TABS
1.0000 | ORAL_TABLET | Freq: Every day | ORAL | Status: DC
  Administered 2014-11-10 – 2014-11-19 (×9): 1 via ORAL
  Filled 2014-11-10 (×13): qty 1

## 2014-11-10 MED ORDER — ENOXAPARIN SODIUM 60 MG/0.6ML ~~LOC~~ SOLN
55.0000 mg | Freq: Every day | SUBCUTANEOUS | Status: DC
  Administered 2014-11-10: 55 mg via SUBCUTANEOUS
  Filled 2014-11-10 (×2): qty 0.6

## 2014-11-10 MED ORDER — PIPERACILLIN-TAZOBACTAM 3.375 G IVPB
3.3750 g | Freq: Three times a day (TID) | INTRAVENOUS | Status: AC
  Administered 2014-11-10 – 2014-11-19 (×28): 3.375 g via INTRAVENOUS
  Filled 2014-11-10 (×31): qty 50

## 2014-11-10 MED ORDER — INSULIN ASPART 100 UNIT/ML ~~LOC~~ SOLN: 0.0000 [IU] | Freq: Three times a day (TID) | SUBCUTANEOUS | Status: DC

## 2014-11-10 MED ORDER — ENOXAPARIN SODIUM 30 MG/0.3ML ~~LOC~~ SOLN: 30.0000 mg | SUBCUTANEOUS | Status: DC

## 2014-11-10 MED ORDER — VANCOMYCIN HCL IN DEXTROSE 1-5 GM/200ML-% IV SOLN
1000.0000 mg | Freq: Once | INTRAVENOUS | Status: AC
  Administered 2014-11-10: 1000 mg via INTRAVENOUS
  Filled 2014-11-10 (×2): qty 200

## 2014-11-10 MED ORDER — VANCOMYCIN HCL 10 G IV SOLR
1250.0000 mg | INTRAVENOUS | Status: DC
  Administered 2014-11-10 – 2014-11-11 (×2): 1250 mg via INTRAVENOUS
  Filled 2014-11-10 (×3): qty 1250

## 2014-11-10 MED ORDER — RILPIVIRINE HCL 25 MG PO TABS
25.0000 mg | ORAL_TABLET | Freq: Every day | ORAL | Status: DC
  Administered 2014-11-10 – 2014-11-19 (×9): 25 mg via ORAL
  Filled 2014-11-10 (×13): qty 1

## 2014-11-10 NOTE — Progress Notes (Addendum)
ANTIBIOTIC CONSULT NOTE - INITIAL  Pharmacy Consult for Zosyn/Vancomycin Indication: Cellulitis of left foot  Allergies  Allergen Reactions  . Cortizone-10 [Hydrocortisone] Rash    Per patient, given local injection at knee and developed rash at local site.     Patient Measurements: Height: 5\' 10"  (177.8 cm) Weight: 242 lb 15.2 oz (110.2 kg) IBW/kg (Calculated) : 68.5   Vital Signs: Temp: 98.2 F (36.8 C) (11/08 2016) Temp Source: Oral (11/08 2016) BP: 121/50 mmHg (11/08 2016) Pulse Rate: 67 (11/08 2016) Intake/Output from previous day:   Intake/Output from this shift:    Labs:  Recent Labs  11/09/14 1610  WBC 14.6*  HGB 9.5*  PLT 487*  CREATININE 1.99*   Estimated Creatinine Clearance: 45.5 mL/min (by C-G formula based on Cr of 1.99). No results for input(s): VANCOTROUGH, VANCOPEAK, VANCORANDOM, GENTTROUGH, GENTPEAK, GENTRANDOM, TOBRATROUGH, TOBRAPEAK, TOBRARND, AMIKACINPEAK, AMIKACINTROU, AMIKACIN in the last 72 hours.   Microbiology: No results found for this or any previous visit (from the past 720 hour(s)).  Medical History: Past Medical History  Diagnosis Date  . Anxiety   . Depression   . Hypertension   . HIV infection ([place]) 2000  . Asthma     HOSPITALIZED WITH EXCERBATION OF ASTHMA - AND BRONCHITIS AND THE FLU DEC 2013  . Shortness of breath     ALLERGIES ARE "ACTING UP"  . Diabetes mellitus     ON INSULIN AND ORAL MEDICATIONS  . GERD (gastroesophageal reflux disease)   . Neuropathy     NEUROPATHY HANDS AND FEET  . Arthritis     LEFT HIP  . Pain     SEVERE PAIN LEFT HIP - HX OF LEFT HIP PINNING JULY 2013    Medications:  Prescriptions prior to admission  Medication Sig Dispense Refill Last Dose  . ARIPiprazole (ABILIFY) 10 MG tablet Take 10 mg by mouth daily.  0 11/09/2014 at Unknown time  . atenolol (TENORMIN) 25 MG tablet Take 25 mg by mouth every morning.    11/09/2014 at 1200  . budesonide-formoterol (SYMBICORT) 80-4.5 MCG/ACT inhaler  Inhale 2 puffs into the lungs 2 (two) times daily.   11/09/2014 at Unknown time  . Cholecalciferol (VITAMIN D3) 5000 UNITS TABS Take 5,000 Units by mouth daily.    11/09/2014 at Unknown time  . clonazePAM (KLONOPIN) 0.5 MG tablet Take 0.5 mg by mouth 4 (four) times daily.   11/09/2014 at Unknown time  . cyclobenzaprine (FLEXERIL) 10 MG tablet Take 10 mg by mouth 2 (two) times daily as needed for muscle spasms.   0 11/08/2014 at Unknown time  . escitalopram (LEXAPRO) 20 MG tablet Take 20 mg by mouth daily.   11/09/2014 at Unknown time  . eszopiclone (LUNESTA) 2 MG TABS tablet Take 3 mg by mouth at bedtime as needed for sleep. Take immediately before bedtime   11/06/2014 at Unknown time  . fenofibrate (TRICOR) 145 MG tablet Take 1 tablet (145 mg total) by mouth daily. 90 tablet 3 11/09/2014 at Unknown time  . furosemide (LASIX) 40 MG tablet Take 40 mg by mouth daily.  0 11/09/2014 at Unknown time  . HYDROcodone-acetaminophen (NORCO) 10-325 MG per tablet Take 1 tablet by mouth every 6 (six) hours as needed. (Patient taking differently: Take 1 tablet by mouth every 6 (six) hours as needed for moderate pain or severe pain. ) 30 tablet 0 11/09/2014 at 1200  . insulin aspart (NOVOLOG) 100 UNIT/ML injection Inject 0-10 Units into the skin 2 (two) times daily. Per sliding scale:  151-200 =  2 201-250 =  3 251-300 =  4 301-350 =  6 351-400 =  8 401+      = 10 units and call provider.   11/07/2014  . Insulin Glargine (TOUJEO SOLOSTAR) 300 UNIT/ML SOPN Inject 70 Units into the skin daily.   11/09/2014 at Unknown time  . methocarbamol (ROBAXIN) 500 MG tablet Take 1 tablet (500 mg total) by mouth every 6 (six) hours as needed for muscle spasms. 40 tablet 1 11/09/2014 at Unknown time  . methylphenidate (RITALIN) 20 MG tablet Take 20 mg by mouth daily.  0 11/09/2014 at Unknown time  . Multiple Vitamins-Minerals (MULTIVITAMIN WITH MINERALS) tablet Take 1 tablet by mouth daily.   11/09/2014 at Unknown time  . ODEFSEY 200-25-25  MG TABS tablet TAKE 1 TABLET BY MOUTH DAILY WITH BREAKFAST 30 tablet 6 11/09/2014 at Unknown time  . pantoprazole (PROTONIX) 40 MG tablet Take 40 mg by mouth daily.  0 11/09/2014 at Unknown time  . potassium chloride SA (K-DUR,KLOR-CON) 20 MEQ tablet Take 20 mEq by mouth daily.  0 11/09/2014 at Unknown time  . pregabalin (LYRICA) 225 MG capsule Take 225 mg by mouth 2 (two) times daily.   11/09/2014 at Unknown time  . rosuvastatin (CRESTOR) 20 MG tablet Take 20 mg by mouth daily.  0 11/09/2014 at Unknown time  . traMADol (ULTRAM) 50 MG tablet Take 100 mg by mouth every 6 (six) hours as needed for moderate pain or severe pain.   0 11/08/2014 at Unknown time  . vitamin E (VITAMIN E) 1000 UNIT capsule Take 1,000 Units by mouth daily.    11/09/2014 at Unknown time  . docusate sodium (COLACE) 100 MG capsule Take 1 capsule (100 mg total) by mouth 2 (two) times daily. (Patient not taking: Reported on 11/08/2014) 40 capsule 1 Not Taking at Unknown time  . feeding supplement, GLUCERNA SHAKE, (GLUCERNA SHAKE) LIQD Take 237 mLs by mouth 3 (three) times daily between meals. (Patient not taking: Reported on 11/09/2014)  0 Not Taking at Unknown time  . insulin glargine (LANTUS) 100 UNIT/ML injection Inject 0.35 mLs (35 Units total) into the skin daily. (Patient not taking: Reported on 11/08/2014) 15 mL 0 Not Taking at Unknown time   Scheduled:  . ARIPiprazole  10 mg Oral Daily  . atenolol  25 mg Oral q morning - 10a  . budesonide-formoterol  2 puff Inhalation BID  . cholecalciferol  5,000 Units Oral Daily  . clonazePAM  0.5 mg Oral QID  . emtricitabine-tenofovir AF  1 tablet Oral Q breakfast   And  . rilpivirine  25 mg Oral Q breakfast  . enoxaparin (LOVENOX) injection  55 mg Subcutaneous Daily  . escitalopram  20 mg Oral Daily  . fenofibrate  160 mg Oral Daily  . furosemide  40 mg Oral Daily  . insulin aspart  0-15 Units Subcutaneous TID WC  . insulin aspart  0-20 Units Subcutaneous TID WC  . insulin aspart  0-5  Units Subcutaneous QHS  . insulin glargine  70 Units Subcutaneous Daily  . methylphenidate  20 mg Oral Daily  . pantoprazole  40 mg Oral Daily  . piperacillin-tazobactam (ZOSYN)  IV  3.375 g Intravenous Q8H  . potassium chloride SA  20 mEq Oral Daily  . pregabalin  225 mg Oral BID  . rosuvastatin  20 mg Oral Daily  . vancomycin  1,250 mg Intravenous Q24H  . vancomycin  1,000 mg Intravenous Once  . vitamin E  1,000 Units  Oral Daily   Infusions:   Assessment: 24 yoF c/o ulcerations of left foot.  Zosyn/Vancomycin per Rx for cellulitis.   Goal of Therapy:  Vancomycin trough level 10-15 mcg/ml  Plan:   Zosyn 3.375 Gm IV q8h EI  Vancomycin 2Gm load (1755 and 0230) then 1250mg  IV q24h  Adjusted Lovenox to 55mg  daily in pt with BMI>30  F/u SCr/cultures/levels as needed  Lawana Pai R 11/10/2014,2:28 AM

## 2014-11-10 NOTE — Progress Notes (Signed)
Subjective: Patient sitting on side of bed getting ready to walk to bathroom. No new complaints   Objective: Vital signs in last 24 hours: Temp:  [98.2 F (36.8 C)-99.8 F (37.7 C)] 99 F (37.2 C) (11/09 0549) Pulse Rate:  [67-80] 74 (11/09 0549) Resp:  [18-22] 20 (11/09 0549) BP: (95-121)/(50-70) 95/52 mmHg (11/09 0549) SpO2:  [95 %-100 %] 100 % (11/09 0549) Weight:  [110.2 kg (242 lb 15.2 oz)-110.224 kg (243 lb)] 110.2 kg (242 lb 15.2 oz) (11/09 0200)  Intake/Output from previous day:   Intake/Output this shift:     Recent Labs  11/09/14 1610 11/10/14 0555  HGB 9.5* 9.6*    Recent Labs  11/09/14 1610 11/10/14 0555  WBC 14.6* 11.9*  RBC 3.55* 3.65*  HCT 29.7* 31.3*  PLT 487* 478*    Recent Labs  11/09/14 1610 11/10/14 0555  NA 130* 135  K 4.3 3.5  CL 95* 94*  CO2 25 30  BUN 32* 30*  CREATININE 1.99* 2.00*  GLUCOSE 447* 158*  CALCIUM 9.4 9.7    Recent Labs  11/10/14 0555  INR 1.27    Dorsum of foot with dry eschar now. Less drainage than last night. Still malodorous  Assessment/Plan: Left foot ulcers- I have spoken with Dr. Doran Durand and he will see the patient later today and may recommendations   [name] V 11/10/2014, 8:10 AM

## 2014-11-10 NOTE — Progress Notes (Signed)
[facility] Progress Note    Patient: [name]  VOZ:[id number]  DOB: 1965-09-21  DOA: 11/09/2014 Date of Service: the patient was seen and examined on 11/10/2014  Subjective: Patient mentions the ulcer has been drying up and pain has improved Denies having any complaint of chest pain and abdominal pain nausea vomiting or diarrhea Nutrition: Diabetic diet adequate appetite Activity: Uses wheelchair at baseline and ambulate with walker around the house Last BM: Yesterday   Brief Summary of Hospitalization: Day 1 of admission Patient admitted on 11/09/2014 for nonhealing left foot ulcer, patient was started on vancomycin and Fortaz, orthopedic was consulted and currently we're following, MRI shows anaerobic cellulitis with probable subcutaneous emphysema.  Assessment and Plan 1. Diabetic foot infection ([place]) Currently on vancomycin and Fortaz MRI shows no evidence of osteomyelitis and shows cellulitis. X-ray shows possible subcutaneous emphysema. Orthopedic is currently following with the patient. We will check ESR and CRP We discussed with ID in the morning after recommendation from orthopedics for long-term antibiotic management.  2. Human immunodeficiency virus (HIV) disease ([place]) Currently continuing patient's home HIV medication CD4 count 09/29/2014 29%, 570 absolute  3  Diabetes mellitus type 2 with complications, uncontrolled ([place]) Diabetic nephropathy Diabetic neuropathy Pharmacy is consulted for management of antibiotic for renal dosing. Patient is currently on home Lantus and moderate sliding scale Hemoglobin A1c 8.4 07/08/2014 Recent hemoglobin A1c pending Avoid nephrotoxic medications Continue Lyrica  4  Dyslipidemia Continuing statin  5  Mood disorder ([name]) Continuing home medications including Abilify, Lexapro, Klonopin scheduled, retarded  6  Essential hypertension Blood pressure stabilized we'll continue home medications  7  Diastolic  dysfunction Continuing Lasix at home doses  DVT Prophylaxis: subcutaneous Heparin Nutrition: Diabetic diet  Advance goals of care discussion: Full code  Disposition:  Discharge pending recommendation from orthopedic to home.  Consultants: Orthopedics Antibiotics: Vancomycin and Fortaz start date 11/09/2014   Intake/Output Summary (Last 24 hours) at 11/10/14 1322 Last data filed at 11/10/14 0900  Gross per 24 hour  Intake    240 ml  Output      0 ml  Net    240 ml   Filed Weights   11/10/14 0200  Weight: 110.2 kg (242 lb 15.2 oz)    Objective: Physical Exam: Filed Vitals:   11/10/14 0200 11/10/14 0549 11/10/14 0907 11/10/14 1035  BP:  95/52  106/52  Pulse:  74  73  Temp:  99 F (37.2 C)  99.2 F (37.3 C)  TempSrc:  Oral    Resp:  20  18  Height: 5' 10"  (1.778 m)     Weight: 110.2 kg (242 lb 15.2 oz)     SpO2:  100% 100% 99%     General: Appear in mild distress Eyes: PERRL ENT: Oral Mucosa moist. Neck: no JVD Cardiovascular: S1 and S2 Present, no Murmur, Peripheral Pulses present Respiratory: Bilateral Air entry present and Clear to Auscultation, no Crackles, no wheezes Abdomen: Bowel Sound present, Soft and no tenderness Skin: No redness or warmth Large open ulcer on the left foot with crusting Extremities: Bilateral trace Pedal edema, no calf tenderness Neurologic: Grossly no focal neuro deficit.  Data Reviewed: CBC:  Recent Labs Lab 11/09/14 1610 11/10/14 0555  WBC 14.6* 11.9*  NEUTROABS 12.5*  --   HGB 9.5* 9.6*  HCT 29.7* 31.3*  MCV 83.7 85.8  PLT 487* 425*   Basic Metabolic Panel:  Recent Labs Lab 11/09/14 1610 11/10/14 0555  NA 130* 135  K 4.3 3.5  CL 95* 94*  CO2 25 30  GLUCOSE 447* 158*  BUN 32* 30*  CREATININE 1.99* 2.00*  CALCIUM 9.4 9.7   Liver Function Tests:  Recent Labs Lab 11/10/14 0555  AST 13*  ALT 11*  ALKPHOS 102  BILITOT 0.6  PROT 7.8  ALBUMIN 2.6*   No results for input(s): LIPASE, AMYLASE in the  last 168 hours. No results for input(s): AMMONIA in the last 168 hours.  Cardiac Enzymes: No results for input(s): CKTOTAL, CKMB, CKMBINDEX, TROPONINI in the last 168 hours. BNP (last 3 results) No results for input(s): BNP in the last 8760 hours.  ProBNP (last 3 results) No results for input(s): PROBNP in the last 8760 hours.  CBG:  Recent Labs Lab 11/09/14 2031 11/10/14 0748 11/10/14 1215  GLUCAP 209* 126* 155*    Recent Results (from the past 240 hour(s))  MRSA PCR Screening     Status: None   Collection Time: 11/10/14  2:22 AM  Result Value Ref Range Status   MRSA by PCR NEGATIVE NEGATIVE Final    Comment:        The GeneXpert MRSA Assay (FDA approved for NASAL specimens only), is one component of a comprehensive MRSA colonization surveillance program. It is not intended to diagnose MRSA infection nor to guide or monitor treatment for MRSA infections.      Studies: Mr Foot Left Wo Contrast  11/10/2014  CLINICAL DATA:  Diabetic foot ulcer.  Redness and swelling. EXAM: MRI OF THE LEFT FOREFOOT WITHOUT CONTRAST TECHNIQUE: Multiplanar, multisequence MR imaging was performed. No intravenous contrast was administered. COMPARISON:  Radiographs dated 11/09/2014 FINDINGS: There is extensive subcutaneous gas on the dorsum of the midfoot. No discrete abscesses. There is no bone destruction in the midfoot although there is edema in the cuneiforms, cuboid, and navicular as well as slight edema in the bases of the metatarsals. There is a chronic Freiberg's infraction of the head of the second metatarsal, avascular necrosis. There is small ankle joint effusion. Visualized muscles and tendons are normal. IMPRESSION: Findings consistent with anaerobic cellulitis of the dorsum of the foot. The edema in the bones of the midfoot are most likely stress reactions with secondary to diabetic neuropathy. The findings are not consistent with avascular necrosis in the midfoot or osteomyelitis.  Electronically Signed   By: Lorriane Shire M.D.   On: 11/10/2014 07:37   Dg Foot 2 Views Left  11/09/2014  CLINICAL DATA:  Diabetic foot ulcer on the dorsum of the foot and ankle. EXAM: LEFT FOOT - 2 VIEW COMPARISON:  None. FINDINGS: There is an open wound on the dorsum of the midfoot with extensive subcutaneous emphysema. No obvious destructive bony changes to suggest osteomyelitis. No destructive joint changes to suggest septic arthritis. There is chronic appearing AVN of the second metatarsal head (Freiberg's infraction). IMPRESSION: 1. Open wound on the dorsum of the midfoot with subcutaneous emphysema. 2. No definite plain film findings to suggest septic arthritis or osteomyelitis 3. Chronic AVN of the second metatarsal head. Electronically Signed   By: Marijo Sanes M.D.   On: 11/09/2014 16:20    Scheduled Meds: . ARIPiprazole  10 mg Oral Daily  . atenolol  25 mg Oral q morning - 10a  . budesonide-formoterol  2 puff Inhalation BID  . cholecalciferol  5,000 Units Oral Daily  . clonazePAM  0.5 mg Oral QID  . emtricitabine-tenofovir AF  1 tablet Oral Q breakfast   And  . rilpivirine  25 mg Oral Q breakfast  .  enoxaparin (LOVENOX) injection  55 mg Subcutaneous Daily  . escitalopram  20 mg Oral Daily  . fenofibrate  160 mg Oral Daily  . furosemide  40 mg Oral Daily  . insulin aspart  0-15 Units Subcutaneous TID WC  . insulin aspart  0-5 Units Subcutaneous QHS  . insulin glargine  70 Units Subcutaneous Daily  . methylphenidate  20 mg Oral Daily  . pantoprazole  40 mg Oral Daily  . piperacillin-tazobactam (ZOSYN)  IV  3.375 g Intravenous Q8H  . potassium chloride SA  20 mEq Oral Daily  . pregabalin  225 mg Oral BID  . rosuvastatin  20 mg Oral Daily  . vancomycin  1,250 mg Intravenous Q24H   Continuous Infusions:   Time spent: 25 minutes  Author: Berle Mull, MD [facility] Pager: [phone] 11/10/2014 1:22 PM  If 7PM-7AM, please contact night-coverage at [email or web address],  password [facility]

## 2014-11-10 NOTE — Consult Note (Signed)
WOC wound consult note Reason for Consult: Neuropathic ulcer and other ulcers to foot.  Simultaneous consult made by hospitalist to Orthopedic Service. Dr. Elmyra Ricks saw last evening and this morning and will involve Dr. Louis Meckel for further recommendations.  WOC nursing will defer to their expertise and not see, not follow. Thanks, Maudie Flakes, MSN, RN, [place], Arther Abbott  Pager# [phone]

## 2014-11-10 NOTE — Care Management Note (Addendum)
Case Management Note  Patient Details  Name: [name] MRN: [id number] Date of Birth: 09/29/1965  Subjective/Objective:          infection of foot despite treatment          Action/Plan:Date: November 10, 2014 Chart reviewed for concurrent status and case management needs. Will continue to follow patient for changes and needs: Velva Harman, RN, BSN, [place]   [phone]   Expected Discharge Date:  11/15/14               Expected Discharge Plan:  Home/Self Care  In-House Referral:  NA  Discharge planning Services  CM Consult  Post Acute Care Choice:  NA Choice offered to:  NA  DME Arranged:    DME Agency:     HH Arranged:    HH Agency:     Status of Service:  In process, will continue to follow  Medicare Important Message Given:    Date Medicare IM Given:    Medicare IM give by:    Date Additional Medicare IM Given:    Additional Medicare Important Message give by:     If discussed at [place] of Stay Meetings, dates discussed:    Additional Comments:  Leeroy Cha, RN 11/10/2014, 11:34 AM

## 2014-11-10 NOTE — Progress Notes (Addendum)
Inpatient Diabetes Program Recommendations  AACE/ADA: New Consensus Statement on Inpatient Glycemic Control (2015)  Target Ranges:  Prepandial:   less than 140 mg/dL      Peak postprandial:   less than 180 mg/dL (1-2 hours)      Critically ill patients:  140 - 180 mg/dL   Review of Glycemic Control  Diabetes history: type 2 Outpatient Diabetes medications: Toujeo 70 units and Current orders for Inpatient glycemic control:  Lantus 70 units daily and resistant correction tidwc and HS scales  Inpatient Diabetes Program Recommendations Patient with foot ulcer. May be helpfu to order a consult for [place] Radiology evaluation and Management consult to assess peripheral lblood flow to the ulcerated area.  Thank you Rosita Kea, RN, MSN, CDE  Diabetes Inpatient Program Office: [phone] Pager: [phone] 8:00 am to 5:00 pm

## 2014-11-10 NOTE — Progress Notes (Signed)
Subjective: 49 y/o female with PMH of diabetes and HIV presents with non healing ulcer of left ankle and dorsal foot for 2 months.  She has had no treatment so far.  She denies any pain.  She quit smoking since her last admission.  Mother is at bedside.  No h/o amputations so far.     Objective: Vital signs in last 24 hours: Temp:  [98.2 F (36.8 C)-99.2 F (37.3 C)] 99.2 F (37.3 C) (11/09 1035) Pulse Rate:  [67-74] 73 (11/09 1035) Resp:  [18-20] 18 (11/09 1035) BP: (95-121)/(50-60) 106/52 mmHg (11/09 1035) SpO2:  [95 %-100 %] 99 % (11/09 1035) Weight:  [110.2 kg (242 lb 15.2 oz)-110.224 kg (243 lb)] 110.2 kg (242 lb 15.2 oz) (11/09 0200)  Intake/Output from previous day:   Intake/Output this shift: Total I/O In: 480 [P.O.:480] Out: -    Recent Labs  11/09/14 1610 11/10/14 0555  HGB 9.5* 9.6*    Recent Labs  11/09/14 1610 11/10/14 0555  WBC 14.6* 11.9*  RBC 3.55* 3.65*  HCT 29.7* 31.3*  PLT 487* 478*    Recent Labs  11/09/14 1610 11/10/14 0555  NA 130* 135  K 4.3 3.5  CL 95* 94*  CO2 25 30  BUN 32* 30*  CREATININE 1.99* 2.00*  GLUCOSE 447* 158*  CALCIUM 9.4 9.7    Recent Labs  11/10/14 0555  INR 1.27    PE:  obese female in nad.  A and O x 4.  Mood and affect normal.  EOMI.  resp unalbored.  L ankle and dorsal foot with approx 15 cm x 10 cm full thckness ulcer.  5/5 strength in DF of the ankle.  No evident tendon exposure.  No lymphadenopathy.  Sens to LT diminished at the foot.  brisk cap refill at the toes.  slight erythema proximal tot he ulcer.  thin eschar over the entire wound.  no drianage.  Assessment/Plan: L dorsal foot and ankle ulcer - I've explained the nature of the problem to the patient and her mother in detail.  Based on the appearance of the wound I believe this will likely require amputation below the knee.  I've explained to the patient that she's at high risk for limb loss.  I believe consultation by Dr. Marla Roe or Dr. Iran Planas  of the plastic surgery service is appropriate at this time.  They may be able to offer alternatives of wound care or flap coverage to salvage the limb.  If their consultation does not yield any viable alternatives, please contact me, and I'll come back to discuss amputation.  I agree with continued IV abx to treat her cellulitis.   Wylene Simmer 11/10/2014, 5:52 PM

## 2014-11-11 ENCOUNTER — Other Ambulatory Visit: Payer: Self-pay | Admitting: *Deleted

## 2014-11-11 ENCOUNTER — Telehealth: Payer: Self-pay | Admitting: Endocrinology

## 2014-11-11 LAB — CBC WITH DIFFERENTIAL/PLATELET
Basophils Absolute: 0 10*3/uL (ref 0.0–0.1)
Basophils Relative: 0 %
EOS ABS: 0.4 10*3/uL (ref 0.0–0.7)
Eosinophils Relative: 3 %
HCT: 31.9 % — ABNORMAL LOW (ref 36.0–46.0)
HEMOGLOBIN: 9.9 g/dL — AB (ref 12.0–15.0)
Lymphocytes Relative: 12 %
Lymphs Abs: 1.6 10*3/uL (ref 0.7–4.0)
MCH: 26.3 pg (ref 26.0–34.0)
MCHC: 31 g/dL (ref 30.0–36.0)
MCV: 84.8 fL (ref 78.0–100.0)
Monocytes Absolute: 1 10*3/uL (ref 0.1–1.0)
Monocytes Relative: 7 %
NEUTROS PCT: 78 %
Neutro Abs: 10.7 10*3/uL — ABNORMAL HIGH (ref 1.7–7.7)
Platelets: 459 10*3/uL — ABNORMAL HIGH (ref 150–400)
RBC: 3.76 MIL/uL — AB (ref 3.87–5.11)
RDW: 17.3 % — ABNORMAL HIGH (ref 11.5–15.5)
WBC: 13.7 10*3/uL — AB (ref 4.0–10.5)

## 2014-11-11 LAB — COMPREHENSIVE METABOLIC PANEL
ALBUMIN: 2.5 g/dL — AB (ref 3.5–5.0)
ALK PHOS: 92 U/L (ref 38–126)
ALT: 9 U/L — AB (ref 14–54)
ANION GAP: 10 (ref 5–15)
AST: 12 U/L — ABNORMAL LOW (ref 15–41)
BUN: 35 mg/dL — ABNORMAL HIGH (ref 6–20)
CALCIUM: 9.4 mg/dL (ref 8.9–10.3)
CO2: 29 mmol/L (ref 22–32)
CREATININE: 2.3 mg/dL — AB (ref 0.44–1.00)
Chloride: 95 mmol/L — ABNORMAL LOW (ref 101–111)
GFR calc Af Amer: 27 mL/min — ABNORMAL LOW (ref >60)
GFR calc non Af Amer: 24 mL/min — ABNORMAL LOW (ref >60)
GLUCOSE: 183 mg/dL — AB (ref 65–99)
Potassium: 3.8 mmol/L (ref 3.5–5.1)
SODIUM: 134 mmol/L — AB (ref 135–145)
Total Bilirubin: 0.5 mg/dL (ref 0.3–1.2)
Total Protein: 7.6 g/dL (ref 6.5–8.1)

## 2014-11-11 LAB — GLUCOSE, CAPILLARY
GLUCOSE-CAPILLARY: 159 mg/dL — AB (ref 65–99)
GLUCOSE-CAPILLARY: 195 mg/dL — AB (ref 65–99)
Glucose-Capillary: 338 mg/dL — ABNORMAL HIGH (ref 65–99)
Glucose-Capillary: 231 mg/dL — ABNORMAL HIGH (ref 65–99)

## 2014-11-11 LAB — HEMOGLOBIN A1C
HEMOGLOBIN A1C: 10.7 % — AB (ref 4.8–5.6)
MEAN PLASMA GLUCOSE: 260 mg/dL

## 2014-11-11 LAB — C-REACTIVE PROTEIN: CRP: 19.2 mg/dL — ABNORMAL HIGH (ref <1.0)

## 2014-11-11 LAB — SEDIMENTATION RATE: Sed Rate: 128 mm/hr — ABNORMAL HIGH (ref 0–22)

## 2014-11-11 MED ORDER — INSULIN PEN NEEDLE 32G X 5 MM MISC
Status: DC
Start: 2014-11-11 — End: 2016-05-25

## 2014-11-11 MED ORDER — SODIUM CHLORIDE 0.9 % IV BOLUS (SEPSIS)
1000.0000 mL | Freq: Once | INTRAVENOUS | Status: AC
  Administered 2014-11-11: 1000 mL via INTRAVENOUS

## 2014-11-11 MED ORDER — PREGABALIN 50 MG PO CAPS
150.0000 mg | ORAL_CAPSULE | Freq: Two times a day (BID) | ORAL | Status: DC
  Administered 2014-11-11 – 2014-11-22 (×22): 150 mg via ORAL
  Filled 2014-11-11: qty 2
  Filled 2014-11-11: qty 3
  Filled 2014-11-11 (×2): qty 2
  Filled 2014-11-11: qty 3
  Filled 2014-11-11 (×3): qty 2
  Filled 2014-11-11 (×3): qty 3
  Filled 2014-11-11: qty 6
  Filled 2014-11-11 (×7): qty 3
  Filled 2014-11-11 (×2): qty 2
  Filled 2014-11-11: qty 3

## 2014-11-11 MED ORDER — HEPARIN SODIUM (PORCINE) 5000 UNIT/ML IJ SOLN
5000.0000 [IU] | Freq: Three times a day (TID) | INTRAMUSCULAR | Status: DC
  Administered 2014-11-11 – 2014-11-17 (×17): 5000 [IU] via SUBCUTANEOUS
  Filled 2014-11-11 (×17): qty 1

## 2014-11-11 MED ORDER — SODIUM CHLORIDE 0.9 % IV BOLUS (SEPSIS): 500.0000 mL | Freq: Once | INTRAVENOUS | Status: DC

## 2014-11-11 MED ORDER — LIRAGLUTIDE 18 MG/3ML ~~LOC~~ SOPN
PEN_INJECTOR | SUBCUTANEOUS | Status: DC
Start: 2014-11-11 — End: 2015-01-08

## 2014-11-11 NOTE — Consult Note (Addendum)
Reason for Consult: diabetic foot ulcerations Referring Physician: Dr. Lewanda Rife Location : [facility] inpatient Date: 11/11/2014  [name] is an 49 y.o. female.  HPI: Consulted for evaluation foot ulcerations in this poorly controlled diabetic. Onset wound few months ago and worse over last week. Wound care prior to admission sil[facility]. MR with soft tissue air noted but no evidence osteomyelitis. She reports she quit smoking last month. No current wound care ordered. No vascular screening or work up to date.  PMH significant for HIV managed by Dr. Johnnye Sima   No recent prealbumin. She was seen once in [place] 04/2014 for left lateral foot wound, she did not follow up in the clinic but states she healed that wound. She was referred to Vascular Surgery from the wound center for screening; states she was sick at time of appt and was not able to keep.   Past Medical History  Diagnosis Date  . Anxiety   . Depression   . Hypertension   . HIV infection ([place]) 2000  . Asthma     HOSPITALIZED WITH EXCERBATION OF ASTHMA - AND BRONCHITIS AND THE FLU DEC 2013  . Shortness of breath     ALLERGIES ARE "ACTING UP"  . Diabetes mellitus     ON INSULIN AND ORAL MEDICATIONS  . GERD (gastroesophageal reflux disease)   . Neuropathy     NEUROPATHY HANDS AND FEET  . Arthritis     LEFT HIP  . Pain     SEVERE PAIN LEFT HIP - HX OF LEFT HIP PINNING JULY 2013    Past Surgical History  Procedure Laterality Date  . Hip pinning,cannulated  07/16/2011    Procedure: CANNULATED HIP PINNING;  Surgeon: Gearlean Alf, MD;  Location: [facility];  Service: Orthopedics;  Laterality: Left;  . Hip pinning,cannulated Left 04/09/2012    Procedure: CANNULATED HIP PINNING AND HARDWARE REVISION;  Surgeon: Gearlean Alf, MD;  Location: [facility];  Service: Orthopedics;  Laterality: Left;  . Hip arthroplasty Left 05/05/2014    Procedure: ARTHROPLASTY OPEN REDUCTION INTERNAL FIXATION LEFT HIP AND REMOVAL OF TWO CANNULATED  SCREW;  Surgeon: Latanya Maudlin, MD;  Location: [facility];  Service: Orthopedics;  Laterality: Left;  . Cast application Left 08/04/4194    Procedure: CAST APPLICATION (FIBERGLASS);  Surgeon: Latanya Maudlin, MD;  Location: [facility];  Service: Orthopedics;  Laterality: Left;  CLOSED REDUCTION OF LEFT COLLES FRACTURE WITH SHORT ARM CAST  . Hardware removal Left 05/05/2014    Procedure: HARDWARE REMOVAL LEFT HIP;  Surgeon: Latanya Maudlin, MD;  Location: [facility];  Service: Orthopedics;  Laterality: Left;  REMOVAL BIOMET 6.5-8.0 CANNULATED SCREW    Family History  Problem Relation Age of Onset  . Diabetes Mother   . Hypertension Mother   . Vision loss Mother   . Heart disease Mother   . Hypertension Father   . Thyroid disease Neg Hx     Social History:  reports that she has been smoking E-cigarettes.  She has a 17.5 pack-year smoking history. She has never used smokeless tobacco. She reports that she does not drink alcohol or use illicit drugs.  Allergies:  Allergies  Allergen Reactions  . Cortizone-10 [Hydrocortisone] Rash    Per patient, given local injection at knee and developed rash at local site.     Medications: I have reviewed the patient's current medications.  Results for orders placed or performed during the hospital encounter of 11/09/14 (from the past 48 hour(s))  Glucose, capillary  Status: Abnormal   Collection Time: 11/09/14  8:31 PM  Result Value Ref Range   Glucose-Capillary 209 (H) 65 - 99 mg/dL  MRSA PCR Screening     Status: None   Collection Time: 11/10/14  2:22 AM  Result Value Ref Range   MRSA by PCR NEGATIVE NEGATIVE    Comment:        The GeneXpert MRSA Assay (FDA approved for NASAL specimens only), is one component of a comprehensive MRSA colonization surveillance program. It is not intended to diagnose MRSA infection nor to guide or monitor treatment for MRSA infections.   Wound culture     Status: None (Preliminary result)   Collection Time: 11/10/14   2:22 AM  Result Value Ref Range   Specimen Description WOUND L FOOT    Special Requests NONE    Gram Stain      NO WBC SEEN NO SQUAMOUS EPITHELIAL CELLS SEEN ABUNDANT GRAM POSITIVE COCCI IN PAIRS IN CLUSTERS ABUNDANT GRAM NEGATIVE RODS Performed at [facility]    Culture      Culture reincubated for better growth Performed at [facility]    Report Status PENDING   CBC     Status: Abnormal   Collection Time: 11/10/14  5:55 AM  Result Value Ref Range   WBC 11.9 (H) 4.0 - 10.5 K/uL   RBC 3.65 (L) 3.87 - 5.11 MIL/uL   Hemoglobin 9.6 (L) 12.0 - 15.0 g/dL   HCT 31.3 (L) 36.0 - 46.0 %   MCV 85.8 78.0 - 100.0 fL   MCH 26.3 26.0 - 34.0 pg   MCHC 30.7 30.0 - 36.0 g/dL   RDW 17.3 (H) 11.5 - 15.5 %   Platelets 478 (H) 150 - 400 K/uL  Comprehensive metabolic panel     Status: Abnormal   Collection Time: 11/10/14  5:55 AM  Result Value Ref Range   Sodium 135 135 - 145 mmol/L   Potassium 3.5 3.5 - 5.1 mmol/L    Comment: DELTA CHECK NOTED REPEATED TO VERIFY    Chloride 94 (L) 101 - 111 mmol/L   CO2 30 22 - 32 mmol/L   Glucose, Bld 158 (H) 65 - 99 mg/dL   BUN 30 (H) 6 - 20 mg/dL   Creatinine, Ser 2.00 (H) 0.44 - 1.00 mg/dL   Calcium 9.7 8.9 - 10.3 mg/dL   Total Protein 7.8 6.5 - 8.1 g/dL   Albumin 2.6 (L) 3.5 - 5.0 g/dL   AST 13 (L) 15 - 41 U/L   ALT 11 (L) 14 - 54 U/L   Alkaline Phosphatase 102 38 - 126 U/L   Total Bilirubin 0.6 0.3 - 1.2 mg/dL   GFR calc non Af Amer 28 (L) >60 mL/min   GFR calc Af Amer 32 (L) >60 mL/min    Comment: (NOTE) The eGFR has been calculated using the CKD EPI equation. This calculation has not been validated in all clinical situations. eGFR's persistently <60 mL/min signify possible Chronic Kidney Disease.    Anion gap 11 5 - 15  Protime-INR     Status: Abnormal   Collection Time: 11/10/14  5:55 AM  Result Value Ref Range   Prothrombin Time 16.1 (H) 11.6 - 15.2 seconds   INR 1.27 0.00 - 1.49  Hemoglobin A1c     Status:  Abnormal   Collection Time: 11/10/14  5:55 AM  Result Value Ref Range   Hgb A1c MFr Bld 10.7 (H) 4.8 - 5.6 %    Comment: (  NOTE)         Pre-diabetes: 5.7 - 6.4         Diabetes: >6.4         Glycemic control for adults with diabetes: <7.0    Mean Plasma Glucose 260 mg/dL    Comment: (NOTE) Performed At: [facility] [place], [place] [id number] Lindon Romp MD [id number]   TSH     Status: None   Collection Time: 11/10/14  5:55 AM  Result Value Ref Range   TSH 0.843 0.350 - 4.500 uIU/mL  Glucose, capillary     Status: Abnormal   Collection Time: 11/10/14  7:48 AM  Result Value Ref Range   Glucose-Capillary 126 (H) 65 - 99 mg/dL  Glucose, capillary     Status: Abnormal   Collection Time: 11/10/14 12:15 PM  Result Value Ref Range   Glucose-Capillary 155 (H) 65 - 99 mg/dL  Glucose, capillary     Status: Abnormal   Collection Time: 11/10/14  5:05 PM  Result Value Ref Range   Glucose-Capillary 277 (H) 65 - 99 mg/dL  Glucose, capillary     Status: Abnormal   Collection Time: 11/10/14  9:13 PM  Result Value Ref Range   Glucose-Capillary 314 (H) 65 - 99 mg/dL  CBC with Differential/Platelet     Status: Abnormal   Collection Time: 11/11/14  5:25 AM  Result Value Ref Range   WBC 13.7 (H) 4.0 - 10.5 K/uL   RBC 3.76 (L) 3.87 - 5.11 MIL/uL   Hemoglobin 9.9 (L) 12.0 - 15.0 g/dL   HCT 31.9 (L) 36.0 - 46.0 %   MCV 84.8 78.0 - 100.0 fL   MCH 26.3 26.0 - 34.0 pg   MCHC 31.0 30.0 - 36.0 g/dL   RDW 17.3 (H) 11.5 - 15.5 %   Platelets 459 (H) 150 - 400 K/uL   Neutrophils Relative % 78 %   Neutro Abs 10.7 (H) 1.7 - 7.7 K/uL   Lymphocytes Relative 12 %   Lymphs Abs 1.6 0.7 - 4.0 K/uL   Monocytes Relative 7 %   Monocytes Absolute 1.0 0.1 - 1.0 K/uL   Eosinophils Relative 3 %   Eosinophils Absolute 0.4 0.0 - 0.7 K/uL   Basophils Relative 0 %   Basophils Absolute 0.0 0.0 - 0.1 K/uL  Comprehensive metabolic panel     Status: Abnormal   Collection Time:  11/11/14  5:25 AM  Result Value Ref Range   Sodium 134 (L) 135 - 145 mmol/L   Potassium 3.8 3.5 - 5.1 mmol/L   Chloride 95 (L) 101 - 111 mmol/L   CO2 29 22 - 32 mmol/L   Glucose, Bld 183 (H) 65 - 99 mg/dL   BUN 35 (H) 6 - 20 mg/dL   Creatinine, Ser 2.30 (H) 0.44 - 1.00 mg/dL   Calcium 9.4 8.9 - 10.3 mg/dL   Total Protein 7.6 6.5 - 8.1 g/dL   Albumin 2.5 (L) 3.5 - 5.0 g/dL   AST 12 (L) 15 - 41 U/L   ALT 9 (L) 14 - 54 U/L   Alkaline Phosphatase 92 38 - 126 U/L   Total Bilirubin 0.5 0.3 - 1.2 mg/dL   GFR calc non Af Amer 24 (L) >60 mL/min   GFR calc Af Amer 27 (L) >60 mL/min    Comment: (NOTE) The eGFR has been calculated using the CKD EPI equation. This calculation has not been validated in all clinical situations. eGFR's persistently <60 mL/min signify possible Chronic  Kidney Disease.    Anion gap 10 5 - 15  Sedimentation rate     Status: Abnormal   Collection Time: 11/11/14  5:25 AM  Result Value Ref Range   Sed Rate 128 (H) 0 - 22 mm/hr  C-reactive protein     Status: Abnormal   Collection Time: 11/11/14  5:25 AM  Result Value Ref Range   CRP 19.2 (H) <1.0 mg/dL    Comment: Performed at [facility]  Glucose, capillary     Status: Abnormal   Collection Time: 11/11/14  7:23 AM  Result Value Ref Range   Glucose-Capillary 159 (H) 65 - 99 mg/dL  Glucose, capillary     Status: Abnormal   Collection Time: 11/11/14 11:57 AM  Result Value Ref Range   Glucose-Capillary 195 (H) 65 - 99 mg/dL  Glucose, capillary     Status: Abnormal   Collection Time: 11/11/14  5:39 PM  Result Value Ref Range   Glucose-Capillary 338 (H) 65 - 99 mg/dL    Mr Foot Left Wo Contrast  11/10/2014  CLINICAL DATA:  Diabetic foot ulcer.  Redness and swelling. EXAM: MRI OF THE LEFT FOREFOOT WITHOUT CONTRAST TECHNIQUE: Multiplanar, multisequence MR imaging was performed. No intravenous contrast was administered. COMPARISON:  Radiographs dated 11/09/2014 FINDINGS: There is extensive subcutaneous gas  on the dorsum of the midfoot. No discrete abscesses. There is no bone destruction in the midfoot although there is edema in the cuneiforms, cuboid, and navicular as well as slight edema in the bases of the metatarsals. There is a chronic Freiberg's infraction of the head of the second metatarsal, avascular necrosis. There is small ankle joint effusion. Visualized muscles and tendons are normal. IMPRESSION: Findings consistent with anaerobic cellulitis of the dorsum of the foot. The edema in the bones of the midfoot are most likely stress reactions with secondary to diabetic neuropathy. The findings are not consistent with avascular necrosis in the midfoot or osteomyelitis. Electronically Signed   By: James  Maxwell M.D.   On: 11/10/2014 07:37    ROS Blood pressure 112/56, pulse 72, temperature 98.4 F (36.9 C), temperature source Axillary, resp. rate 18, height 5' 10" (1.778 m), weight 110.2 kg (242 lb 15.2 oz), last menstrual period 11/16/2010, SpO2 99 %. Physical Exam Alert, NAD Left foot eschar dorsum approximately 9 x 8 cm. Able to minimally move toes. Diminished sensation present. Unable to palpate PT; DP weak Edema foot with erythema distal leg Eschar has one hole that has necrotic fat and fluid able to be expressed from it.  Assessment/Plan: Agree with prior orthopaedic assessments that she is high risk amputation in this poorly controlled diabetic. She desires limb salvage and states bone not involved and she feels can be saved. She will require debridement, counseled the skin over near entire dorsum foot has full thickness necrosis and there will be exposure tendons and possible bone following debridement, I am unable to tell depth of necrosis presently. There are no local flaps to cover this area wound. If bone exposed would have to consider referral for free flap coverage. Will likely require VAC, plan possible Integra treatment at time of this debridement or in future. We can make plans for  this early next week.   I would recommend Vascular surgery evaluate patient prior to debridement, counseled patient that if inflow to leg is insufficient, it will not support wound healing and aggressive debridement will lead to larger and larger wounds.  Recommend dry dressing alone to area until time of   debridement.  Irene Limbo, MD [facility] Plastic & Reconstructive Surgery [phone]

## 2014-11-11 NOTE — Progress Notes (Addendum)
Inpatient Diabetes Program Recommendations  AACE/ADA: New Consensus Statement on Inpatient Glycemic Control (2015)  Target Ranges:  Prepandial:   less than 140 mg/dL      Peak postprandial:   less than 180 mg/dL (1-2 hours)      Critically ill patients:  140 - 180 mg/dL   Results for ALEXUS, BUSBIN (MRN [id number]) as of 11/11/2014 08:55  Ref. Range 05/03/2014 06:11 07/08/2014 04:20 11/10/2014 05:55  Hemoglobin A1C Latest Ref Range: 4.8-5.6 % 8.3 (H) 8.4 (H) 10.7 (H)   Results for AHMARI, ORRIS (MRN [id number]) as of 11/11/2014 08:55  Ref. Range 11/10/2014 07:48 11/10/2014 12:15 11/10/2014 17:05 11/10/2014 21:13  Glucose-Capillary Latest Ref Range: 65-99 mg/dL 126 (H) 155 (H) 277 (H) 314 (H)    Admit with: Diabetic Foot Ulcers  History: DM, HIV  Home DM Meds: Toujeo 70 units daily       Novolog 20 units tidwc  Current Insulin Orders: Lantus 70 units daily      Novolog Moderate SSI (0-15 units) TID AC + HS     -Note patient saw Dr. Elayne Snare [facility]) on 11/08/14.  During that visit, glucose levels were reviewed and patient was given instructions to change her DM medications to the following: 1. Increase Toujeo insulin to 50 units bid 2. Increase Novolog to 20 units tid with meals  3. Start taking Novolog 5 units for every 50 mg/dl above CBG of 200 mg/dl 4. Start Victoza 0.6 mg once daily--Increase Victoza dose to 1.2 mg daily after one week  -Now patient admitted with foot ulcers.  Note per Dr. Nona Dell notes patient may require amputation.  -Current A1c shows very poor control at home= 10.7%.  -Will need to optimize glucose control here in the hospital to help patient begin healing.     MD- If patient continues to have elevated postprandial glucose levels, please consider starting 25% of patients home dose of Novolog-  Novolog 5 units tid with meals   Addendum 10am: Spoke with patient about her blood sugar control at home.  Patient stated she just got  established at [facility] with Dr. Dwyane Dee.  Reviewed the insulin changes that Dr. Dwyane Dee made to patient's insulin regimen.  Patient stated she understood the changes and knows how to dose her insulin at home with those instructions.  Also discussed DM diet information with patient.  Encouraged patient to avoid beverages with sugar (regular soda, sweet tea, lemonade, fruit juice) and to consume mostly water.  Discussed what foods contain carbohydrates and how carbohydrates affect the body's blood sugar levels.  Encouraged patient to be careful with her portion sizes (especially grains, starchy vegetables, and fruits).  Explained to patient that women should have 45-60 grams of carbohydrates per meal per day.  Patient stated to me that Dr. Dwyane Dee forgot to give her a Rx for Victoza at the last office visit.  Told patient I will call Dr. Ronnie Derby office and have his office send an electronic Rx to the [facility] on [address] (per pt request).       --Will follow patient during hospitalization--  Wyn Quaker RN, MSN, CDE Diabetes Coordinator Inpatient Glycemic Control Team Team Pager: [phone] (8a-5p)

## 2014-11-11 NOTE — Telephone Encounter (Signed)
rx has been sent 

## 2014-11-11 NOTE — Progress Notes (Signed)
[facility] Progress Note    Patient: [name]  [id number]  DOB: 1965-04-07  DOA: 11/09/2014 Date of Service: the patient was seen and examined on 11/11/2014  Subjective: Patient was feeling weak and lethargic in the morning. Denies any dizziness or lightheadedness no chest pain and no abdominal pain no diarrhea. No burning urination no cough. Nutrition: He has been able to follow her diet adequately  Brief Summary of Hospitalization: Day 2 of admission Patient admitted on 11/09/2014 for nonhealing left foot ulcer, patient was started on vancomycin and Fortaz, orthopedic was consulted and currently we're following, MRI shows anaerobic cellulitis with probable subcutaneous emphysema. Orthopedic recommends plastic surgery consult  Assessment and Plan 1. Diabetic foot infection ([place]) Currently on vancomycin and Fortaz MRI shows no evidence of osteomyelitis and shows anaerobic cellulitis. X-ray shows possible subcutaneous emphysema. Orthopedic is currently recommending to consult plastic surgery for the patient. Discussed with Dr. Migdalia Dk who will follow up with the patient. We discussed with ID before discharge  2. Hypoension. Patient was hypotensive earlier in the morning. Most likely from poor oral intake with infection Received 1 L of normal saline bolus. Next and discontinued Lasix and atenolol. Also discontinue TriCor. Also reduced the dose Lyrica from 225 mg twice a day to 150 mg twice a day due to her renal function.  3.human immunodeficiency virus (HIV) disease ([place]) Currently continuing patient's home HIV medication CD4 count 09/29/2014 29%, 570 absolute  4  Diabetes mellitus type 2 with complications, uncontrolled ([place]) Diabetic nephropathy Diabetic neuropathy Pharmacy is consulted for management of antibiotic for renal dosing. Patient is currently on home Lantus and moderate sliding scale Hemoglobin A1c 8.4 07/08/2014 Recent hemoglobin A1c  pending. Renal function mildly worsened today , reducing Lyrica  discontinuing antihypertensive medication as well as TriCor  Avoid nephrotoxic medications  5  Dyslipidemia Continuing statin  6  Mood disorder ([name]) Continuing home medications including Abilify, Lexapro, Klonopin scheduled  7  Diastolic dysfunction Currently holding Lasix due to hypotension in the morning   DVT Prophylaxis: subcutaneous Heparin Nutrition: Diabetic diet  Advance goals of care discussion: Full code  Disposition:  Discharge pending recommendation from plastic surgery to home.  Consultants: Orthopedics Plastic surgery   Antibiotics: Vancomycin and Fortaz start date 11/09/2014   Intake/Output Summary (Last 24 hours) at 11/11/14 1825 Last data filed at 11/11/14 0948  Gross per 24 hour  Intake    690 ml  Output      0 ml  Net    690 ml   Filed Weights   11/10/14 0200  Weight: 110.2 kg (242 lb 15.2 oz)    Objective: Physical Exam: Filed Vitals:   11/10/14 2003 11/10/14 2114 11/11/14 0527 11/11/14 1216  BP:  89/44 79/65 112/56  Pulse:  69 61 72  Temp:  99.7 F (37.6 C) 98.2 F (36.8 C) 98.4 F (36.9 C)  TempSrc:  Oral Oral Axillary  Resp:  18 16 18   Height:      Weight:      SpO2: 97% 96% 94% 99%     General: Appear in mild distress Eyes: PERRL ENT: Oral Mucosa moist. Neck: no JVD Cardiovascular: S1 and S2 Present, no Murmur, Peripheral Pulses present Respiratory: Bilateral Air entry present and Clear to Auscultation, no Crackles, no wheezes Abdomen: Bowel Sound present, Soft and no tenderness Skin: No redness or warmth Large open ulcer on the left foot with crusting, appears to be significantly improving as compared to yesterday  Extremities: Bilateral trace Pedal edema,  no calf tenderness Neurologic: Grossly no focal neuro deficit.  Data Reviewed: CBC:  Recent Labs Lab 11/09/14 1610 11/10/14 0555 11/11/14 0525  WBC 14.6* 11.9* 13.7*  NEUTROABS 12.5*  --  10.7*   HGB 9.5* 9.6* 9.9*  HCT 29.7* 31.3* 31.9*  MCV 83.7 85.8 84.8  PLT 487* 478* [id number]   Basic Metabolic Panel:  Recent Labs Lab 11/09/14 1610 11/10/14 0555 11/11/14 0525  NA 130* 135 134*  K 4.3 3.5 3.8  CL 95* 94* 95*  CO2 25 30 29   GLUCOSE 447* 158* 183*  BUN 32* 30* 35*  CREATININE 1.99* 2.00* 2.30*  CALCIUM 9.4 9.7 9.4   Liver Function Tests:  Recent Labs Lab 11/10/14 0555 11/11/14 0525  AST 13* 12*  ALT 11* 9*  ALKPHOS 102 92  BILITOT 0.6 0.5  PROT 7.8 7.6  ALBUMIN 2.6* 2.5*   No results for input(s): LIPASE, AMYLASE in the last 168 hours. No results for input(s): AMMONIA in the last 168 hours.  Cardiac Enzymes: No results for input(s): CKTOTAL, CKMB, CKMBINDEX, TROPONINI in the last 168 hours. BNP (last 3 results) No results for input(s): BNP in the last 8760 hours.  ProBNP (last 3 results) No results for input(s): PROBNP in the last 8760 hours.  CBG:  Recent Labs Lab 11/10/14 1705 11/10/14 2113 11/11/14 0723 11/11/14 1157 11/11/14 1739  GLUCAP 277* 314* 159* 195* 338*    Recent Results (from the past 240 hour(s))  MRSA PCR Screening     Status: None   Collection Time: 11/10/14  2:22 AM  Result Value Ref Range Status   MRSA by PCR NEGATIVE NEGATIVE Final    Comment:        The GeneXpert MRSA Assay (FDA approved for NASAL specimens only), is one component of a comprehensive MRSA colonization surveillance program. It is not intended to diagnose MRSA infection nor to guide or monitor treatment for MRSA infections.   Wound culture     Status: None (Preliminary result)   Collection Time: 11/10/14  2:22 AM  Result Value Ref Range Status   Specimen Description WOUND L FOOT  Final   Special Requests NONE  Final   Gram Stain   Final    NO WBC SEEN NO SQUAMOUS EPITHELIAL CELLS SEEN ABUNDANT GRAM POSITIVE COCCI IN PAIRS IN CLUSTERS ABUNDANT GRAM NEGATIVE RODS Performed at [facility]    Culture   Final    Culture reincubated for  better growth Performed at [facility]    Report Status PENDING  Incomplete     Studies: Mr Foot Left Wo Contrast  11/10/2014  CLINICAL DATA:  Diabetic foot ulcer.  Redness and swelling. EXAM: MRI OF THE LEFT FOREFOOT WITHOUT CONTRAST TECHNIQUE: Multiplanar, multisequence MR imaging was performed. No intravenous contrast was administered. COMPARISON:  Radiographs dated 11/09/2014 FINDINGS: There is extensive subcutaneous gas on the dorsum of the midfoot. No discrete abscesses. There is no bone destruction in the midfoot although there is edema in the cuneiforms, cuboid, and navicular as well as slight edema in the bases of the metatarsals. There is a chronic Freiberg's infraction of the head of the second metatarsal, avascular necrosis. There is small ankle joint effusion. Visualized muscles and tendons are normal. IMPRESSION: Findings consistent with anaerobic cellulitis of the dorsum of the foot. The edema in the bones of the midfoot are most likely stress reactions with secondary to diabetic neuropathy. The findings are not consistent with avascular necrosis in the midfoot or osteomyelitis. Electronically Signed  By: Lorriane Shire M.D.   On: 11/10/2014 07:37    Scheduled Meds: . ARIPiprazole  10 mg Oral Daily  . budesonide-formoterol  2 puff Inhalation BID  . clonazePAM  0.5 mg Oral QID  . emtricitabine-tenofovir AF  1 tablet Oral Q breakfast   And  . rilpivirine  25 mg Oral Q breakfast  . escitalopram  20 mg Oral Daily  . heparin subcutaneous  5,000 Units Subcutaneous 3 times per day  . insulin aspart  0-15 Units Subcutaneous TID WC  . insulin aspart  0-5 Units Subcutaneous QHS  . insulin glargine  70 Units Subcutaneous Daily  . methylphenidate  20 mg Oral Daily  . pantoprazole  40 mg Oral Daily  . piperacillin-tazobactam (ZOSYN)  IV  3.375 g Intravenous Q8H  . pregabalin  150 mg Oral BID  . rosuvastatin  20 mg Oral Daily  . sodium chloride  500 mL Intravenous Once  .  vancomycin  1,250 mg Intravenous Q24H   Continuous Infusions:   Time spent: 25 minutes  Author: Berle Mull, MD [facility] Pager: [phone] 11/11/2014 6:25 PM  If 7PM-7AM, please contact night-coverage at [email or web address], password [facility]

## 2014-11-11 NOTE — Telephone Encounter (Signed)
Pt is still in the hospital but she is asking that we have the victoza rx to [facility] on [address]

## 2014-11-12 ENCOUNTER — Inpatient Hospital Stay (HOSPITAL_COMMUNITY): Payer: Medicare HMO

## 2014-11-12 DIAGNOSIS — I739 Peripheral vascular disease, unspecified: Secondary | ICD-10-CM

## 2014-11-12 DIAGNOSIS — I70245 Atherosclerosis of native arteries of left leg with ulceration of other part of foot: Secondary | ICD-10-CM

## 2014-11-12 LAB — BLOOD GAS, ARTERIAL
ACID-BASE DEFICIT: 0.4 mmol/L (ref 0.0–2.0)
BICARBONATE: 23.4 meq/L (ref 20.0–24.0)
DRAWN BY: 295031
FIO2: 0.21
O2 SAT: 90.5 %
Patient temperature: 98.6
TCO2: 21.3 mmol/L (ref 0–100)
pCO2 arterial: 37.3 mmHg (ref 35.0–45.0)
pH, Arterial: 7.414 (ref 7.350–7.450)
pO2, Arterial: 63.5 mmHg — ABNORMAL LOW (ref 80.0–100.0)

## 2014-11-12 LAB — BRAIN NATRIURETIC PEPTIDE: B NATRIURETIC PEPTIDE 5: 39 pg/mL (ref 0.0–100.0)

## 2014-11-12 LAB — COMPREHENSIVE METABOLIC PANEL
ALK PHOS: 79 U/L (ref 38–126)
ALT: 9 U/L — AB (ref 14–54)
AST: 11 U/L — AB (ref 15–41)
Albumin: 2.3 g/dL — ABNORMAL LOW (ref 3.5–5.0)
Anion gap: 9 (ref 5–15)
BUN: 34 mg/dL — AB (ref 6–20)
CALCIUM: 9.1 mg/dL (ref 8.9–10.3)
CHLORIDE: 97 mmol/L — AB (ref 101–111)
CO2: 27 mmol/L (ref 22–32)
CREATININE: 2.04 mg/dL — AB (ref 0.44–1.00)
GFR calc non Af Amer: 27 mL/min — ABNORMAL LOW (ref >60)
GFR, EST AFRICAN AMERICAN: 32 mL/min — AB (ref >60)
Glucose, Bld: 228 mg/dL — ABNORMAL HIGH (ref 65–99)
Potassium: 4.3 mmol/L (ref 3.5–5.1)
SODIUM: 133 mmol/L — AB (ref 135–145)
Total Bilirubin: 0.4 mg/dL (ref 0.3–1.2)
Total Protein: 6.9 g/dL (ref 6.5–8.1)

## 2014-11-12 LAB — GLUCOSE, CAPILLARY
GLUCOSE-CAPILLARY: 326 mg/dL — AB (ref 65–99)
GLUCOSE-CAPILLARY: 432 mg/dL — AB (ref 65–99)
Glucose-Capillary: 165 mg/dL — ABNORMAL HIGH (ref 65–99)
Glucose-Capillary: 226 mg/dL — ABNORMAL HIGH (ref 65–99)

## 2014-11-12 LAB — WOUND CULTURE: Gram Stain: NONE SEEN

## 2014-11-12 LAB — LACTIC ACID, PLASMA: Lactic Acid, Venous: 1 mmol/L (ref 0.5–2.0)

## 2014-11-12 LAB — CBC WITH DIFFERENTIAL/PLATELET
BASOS ABS: 0 10*3/uL (ref 0.0–0.1)
Basophils Relative: 0 %
EOS ABS: 0.3 10*3/uL (ref 0.0–0.7)
EOS PCT: 3 %
HCT: 29.7 % — ABNORMAL LOW (ref 36.0–46.0)
HEMOGLOBIN: 9.1 g/dL — AB (ref 12.0–15.0)
LYMPHS ABS: 1.7 10*3/uL (ref 0.7–4.0)
Lymphocytes Relative: 13 %
MCH: 26.2 pg (ref 26.0–34.0)
MCHC: 30.6 g/dL (ref 30.0–36.0)
MCV: 85.6 fL (ref 78.0–100.0)
MONOS PCT: 7 %
Monocytes Absolute: 0.8 10*3/uL (ref 0.1–1.0)
Neutro Abs: 10.1 10*3/uL — ABNORMAL HIGH (ref 1.7–7.7)
Neutrophils Relative %: 77 %
PLATELETS: 489 10*3/uL — AB (ref 150–400)
RBC: 3.47 MIL/uL — AB (ref 3.87–5.11)
RDW: 17.6 % — ABNORMAL HIGH (ref 11.5–15.5)
WBC: 13 10*3/uL — ABNORMAL HIGH (ref 4.0–10.5)

## 2014-11-12 LAB — TSH: TSH: 1.224 u[IU]/mL (ref 0.350–4.500)

## 2014-11-12 LAB — BASIC METABOLIC PANEL
ANION GAP: 7 (ref 5–15)
BUN: 28 mg/dL — AB (ref 6–20)
CHLORIDE: 101 mmol/L (ref 101–111)
CO2: 26 mmol/L (ref 22–32)
Calcium: 8.7 mg/dL — ABNORMAL LOW (ref 8.9–10.3)
Creatinine, Ser: 1.73 mg/dL — ABNORMAL HIGH (ref 0.44–1.00)
GFR, EST AFRICAN AMERICAN: 39 mL/min — AB (ref >60)
GFR, EST NON AFRICAN AMERICAN: 33 mL/min — AB (ref >60)
GLUCOSE: 321 mg/dL — AB (ref 65–99)
POTASSIUM: 4.1 mmol/L (ref 3.5–5.1)
Sodium: 134 mmol/L — ABNORMAL LOW (ref 135–145)

## 2014-11-12 LAB — CK: Total CK: 32 U/L — ABNORMAL LOW (ref 38–234)

## 2014-11-12 LAB — SEDIMENTATION RATE: Sed Rate: 130 mm/hr — ABNORMAL HIGH (ref 0–22)

## 2014-11-12 LAB — CORTISOL: Cortisol, Plasma: 7.6 ug/dL

## 2014-11-12 LAB — PREALBUMIN: Prealbumin: 6.7 mg/dL — ABNORMAL LOW (ref 18–38)

## 2014-11-12 LAB — VANCOMYCIN, TROUGH: Vancomycin Tr: 21 ug/mL — ABNORMAL HIGH (ref 10.0–20.0)

## 2014-11-12 LAB — MRSA PCR SCREENING: MRSA by PCR: NEGATIVE

## 2014-11-12 MED ORDER — CLONAZEPAM 0.5 MG PO TABS
0.5000 mg | ORAL_TABLET | Freq: Three times a day (TID) | ORAL | Status: DC | PRN
  Administered 2014-11-13 – 2014-11-20 (×10): 0.5 mg via ORAL
  Filled 2014-11-12 (×11): qty 1

## 2014-11-12 MED ORDER — ZOLPIDEM TARTRATE 5 MG PO TABS
5.0000 mg | ORAL_TABLET | Freq: Once | ORAL | Status: AC
  Administered 2014-11-12: 5 mg via ORAL
  Filled 2014-11-12: qty 1

## 2014-11-12 MED ORDER — TRAMADOL HCL 50 MG PO TABS
100.0000 mg | ORAL_TABLET | Freq: Four times a day (QID) | ORAL | Status: DC | PRN
  Administered 2014-11-12: 100 mg via ORAL
  Filled 2014-11-12: qty 2

## 2014-11-12 MED ORDER — IPRATROPIUM-ALBUTEROL 0.5-2.5 (3) MG/3ML IN SOLN
3.0000 mL | Freq: Four times a day (QID) | RESPIRATORY_TRACT | Status: DC
  Administered 2014-11-12 (×3): 3 mL via RESPIRATORY_TRACT
  Filled 2014-11-12 (×3): qty 3

## 2014-11-12 MED ORDER — IPRATROPIUM-ALBUTEROL 0.5-2.5 (3) MG/3ML IN SOLN
3.0000 mL | Freq: Two times a day (BID) | RESPIRATORY_TRACT | Status: DC
  Administered 2014-11-13 – 2014-11-15 (×4): 3 mL via RESPIRATORY_TRACT
  Filled 2014-11-12 (×5): qty 3

## 2014-11-12 MED ORDER — SODIUM CHLORIDE 0.9 % IV BOLUS (SEPSIS)
1000.0000 mL | Freq: Once | INTRAVENOUS | Status: AC
  Administered 2014-11-12: 1000 mL via INTRAVENOUS

## 2014-11-12 MED ORDER — VITAMINS A & D EX OINT
TOPICAL_OINTMENT | CUTANEOUS | Status: AC
  Administered 2014-11-12: 1
  Filled 2014-11-12: qty 5

## 2014-11-12 MED ORDER — HYDROCODONE-ACETAMINOPHEN 10-325 MG PO TABS
1.0000 | ORAL_TABLET | Freq: Four times a day (QID) | ORAL | Status: DC | PRN
  Administered 2014-11-12 – 2014-11-13 (×3): 1 via ORAL
  Filled 2014-11-12 (×4): qty 1

## 2014-11-12 MED ORDER — SODIUM CHLORIDE 0.9 % IV SOLN
INTRAVENOUS | Status: DC
  Administered 2014-11-12 – 2014-11-16 (×5): via INTRAVENOUS

## 2014-11-12 MED ORDER — GLUCERNA SHAKE PO LIQD
237.0000 mL | Freq: Three times a day (TID) | ORAL | Status: DC
  Administered 2014-11-12 – 2014-11-19 (×9): 237 mL via ORAL
  Filled 2014-11-12 (×11): qty 237

## 2014-11-12 MED ORDER — ALBUMIN HUMAN 5 % IV SOLN
12.5000 g | Freq: Once | INTRAVENOUS | Status: AC
  Administered 2014-11-12: 12.5 g via INTRAVENOUS
  Filled 2014-11-12 (×2): qty 250

## 2014-11-12 MED ORDER — INSULIN ASPART 100 UNIT/ML ~~LOC~~ SOLN
10.0000 [IU] | Freq: Once | SUBCUTANEOUS | Status: AC
  Administered 2014-11-12: 10 [IU] via SUBCUTANEOUS

## 2014-11-12 MED ORDER — ASPIRIN 81 MG PO CHEW
81.0000 mg | CHEWABLE_TABLET | Freq: Every day | ORAL | Status: DC
  Administered 2014-11-12 – 2014-11-17 (×5): 81 mg via ORAL
  Filled 2014-11-12 (×5): qty 1

## 2014-11-12 MED ORDER — VANCOMYCIN HCL IN DEXTROSE 1-5 GM/200ML-% IV SOLN
1000.0000 mg | INTRAVENOUS | Status: AC
  Administered 2014-11-12 – 2014-11-19 (×8): 1000 mg via INTRAVENOUS
  Filled 2014-11-12 (×8): qty 200

## 2014-11-12 NOTE — Progress Notes (Signed)
PHARMACIST - PHYSICIAN COMMUNICATION CONCERNING:  IV Vancomycin  49 yoF on Vancomycin / Zosyn for cellulitis of left foot. Please see note written earlier by Arlyn Dunning, PharmD for more details.    Vancomycin trough tonight is slightly supratherapeutic @ 21 mcg/ml (goal 15-20).    RECOMMENDATION: Reduce from Vancomycin 1250mg  IV q24h to 1000mg  IV q24h  Ralene Bathe, PharmD, BCPS 11/12/2014, 7:55 PM  Pager: [phone]

## 2014-11-12 NOTE — Progress Notes (Signed)
Patient transferred to stepdown, Room change to 1234.  Pt reports she will call her mother to report the room change.  Pt A&O X4.  No complaints.

## 2014-11-12 NOTE — Progress Notes (Signed)
ANTIBIOTIC CONSULT NOTE - follow up  Pharmacy Consult for Zosyn/Vancomycin Indication: Cellulitis of left foot  Allergies  Allergen Reactions  . Cortizone-10 [Hydrocortisone] Rash    Per patient, given local injection at knee and developed rash at local site.     Patient Measurements: Height: 5\' 10"  (177.8 cm) Weight: 242 lb 15.2 oz (110.2 kg) IBW/kg (Calculated) : 68.5   Vital Signs: Temp: 99 F (37.2 C) (11/11 0741) Temp Source: Oral (11/11 0741) BP: 72/39 mmHg (11/11 0741) Pulse Rate: 69 (11/11 0741) Intake/Output from previous day: 11/10 0701 - 11/11 0700 In: 640 [P.O.:240; IV Piggyback:400] Out: -  Intake/Output from this shift:    Labs:  Recent Labs  11/10/14 0555 11/11/14 0525 11/12/14 0547  WBC 11.9* 13.7* 13.0*  HGB 9.6* 9.9* 9.1*  PLT 478* 459* 489*  CREATININE 2.00* 2.30* 2.04*   Estimated Creatinine Clearance: 44.4 mL/min (by C-G formula based on Cr of 2.04). No results for input(s): VANCOTROUGH, VANCOPEAK, VANCORANDOM, GENTTROUGH, GENTPEAK, GENTRANDOM, TOBRATROUGH, TOBRAPEAK, TOBRARND, AMIKACINPEAK, AMIKACINTROU, AMIKACIN in the last 72 hours.   Microbiology: Recent Results (from the past 720 hour(s))  MRSA PCR Screening     Status: None   Collection Time: 11/10/14  2:22 AM  Result Value Ref Range Status   MRSA by PCR NEGATIVE NEGATIVE Final    Comment:        The GeneXpert MRSA Assay (FDA approved for NASAL specimens only), is one component of a comprehensive MRSA colonization surveillance program. It is not intended to diagnose MRSA infection nor to guide or monitor treatment for MRSA infections.   Wound culture     Status: None   Collection Time: 11/10/14  2:22 AM  Result Value Ref Range Status   Specimen Description WOUND L FOOT  Final   Special Requests NONE  Final   Gram Stain   Final    NO WBC SEEN NO SQUAMOUS EPITHELIAL CELLS SEEN ABUNDANT GRAM POSITIVE COCCI IN PAIRS IN CLUSTERS ABUNDANT GRAM NEGATIVE RODS Performed at  [facility]    Culture   Final    MULTIPLE ORGANISMS PRESENT, NONE PREDOMINANT Note: NO STAPHYLOCOCCUS AUREUS ISOLATED NO GROUP A STREP (S.PYOGENES) ISOLATED Performed at [facility]    Report Status 11/12/2014 FINAL  Final    Medical History: Past Medical History  Diagnosis Date  . Anxiety   . Depression   . Hypertension   . HIV infection ([place]) 2000  . Asthma     HOSPITALIZED WITH EXCERBATION OF ASTHMA - AND BRONCHITIS AND THE FLU DEC 2013  . Shortness of breath     ALLERGIES ARE "ACTING UP"  . Diabetes mellitus     ON INSULIN AND ORAL MEDICATIONS  . GERD (gastroesophageal reflux disease)   . Neuropathy     NEUROPATHY HANDS AND FEET  . Arthritis     LEFT HIP  . Pain     SEVERE PAIN LEFT HIP - HX OF LEFT HIP PINNING JULY 2013    Medications:  Prescriptions prior to admission  Medication Sig Dispense Refill Last Dose  . ARIPiprazole (ABILIFY) 10 MG tablet Take 10 mg by mouth daily.  0 11/09/2014 at Unknown time  . atenolol (TENORMIN) 25 MG tablet Take 25 mg by mouth every morning.    11/09/2014 at 1200  . budesonide-formoterol (SYMBICORT) 80-4.5 MCG/ACT inhaler Inhale 2 puffs into the lungs 2 (two) times daily.   11/09/2014 at Unknown time  . Cholecalciferol (VITAMIN D3) 5000 UNITS TABS Take 5,000 Units by mouth daily.  11/09/2014 at Unknown time  . clonazePAM (KLONOPIN) 0.5 MG tablet Take 0.5 mg by mouth 4 (four) times daily.   11/09/2014 at Unknown time  . cyclobenzaprine (FLEXERIL) 10 MG tablet Take 10 mg by mouth 2 (two) times daily as needed for muscle spasms.   0 11/08/2014 at Unknown time  . escitalopram (LEXAPRO) 20 MG tablet Take 20 mg by mouth daily.   11/09/2014 at Unknown time  . eszopiclone (LUNESTA) 2 MG TABS tablet Take 3 mg by mouth at bedtime as needed for sleep. Take immediately before bedtime   11/06/2014 at Unknown time  . fenofibrate (TRICOR) 145 MG tablet Take 1 tablet (145 mg total) by mouth daily. 90 tablet 3 11/09/2014 at Unknown time  .  furosemide (LASIX) 40 MG tablet Take 40 mg by mouth daily.  0 11/09/2014 at Unknown time  . HYDROcodone-acetaminophen (NORCO) 10-325 MG per tablet Take 1 tablet by mouth every 6 (six) hours as needed. (Patient taking differently: Take 1 tablet by mouth every 6 (six) hours as needed for moderate pain or severe pain. ) 30 tablet 0 11/09/2014 at 1200  . insulin aspart (NOVOLOG) 100 UNIT/ML injection Inject 0-10 Units into the skin 2 (two) times daily. Per sliding scale:  151-200 =  2 201-250 =  3 251-300 =  4 301-350 =  6 351-400 =  8 401+      = 10 units and call provider.   11/07/2014  . Insulin Glargine (TOUJEO SOLOSTAR) 300 UNIT/ML SOPN Inject 70 Units into the skin daily.   11/09/2014 at Unknown time  . methocarbamol (ROBAXIN) 500 MG tablet Take 1 tablet (500 mg total) by mouth every 6 (six) hours as needed for muscle spasms. 40 tablet 1 11/09/2014 at Unknown time  . methylphenidate (RITALIN) 20 MG tablet Take 20 mg by mouth daily.  0 11/09/2014 at Unknown time  . Multiple Vitamins-Minerals (MULTIVITAMIN WITH MINERALS) tablet Take 1 tablet by mouth daily.   11/09/2014 at Unknown time  . ODEFSEY 200-25-25 MG TABS tablet TAKE 1 TABLET BY MOUTH DAILY WITH BREAKFAST 30 tablet 6 11/09/2014 at Unknown time  . pantoprazole (PROTONIX) 40 MG tablet Take 40 mg by mouth daily.  0 11/09/2014 at Unknown time  . potassium chloride SA (K-DUR,KLOR-CON) 20 MEQ tablet Take 20 mEq by mouth daily.  0 11/09/2014 at Unknown time  . pregabalin (LYRICA) 225 MG capsule Take 225 mg by mouth 2 (two) times daily.   11/09/2014 at Unknown time  . rosuvastatin (CRESTOR) 20 MG tablet Take 20 mg by mouth daily.  0 11/09/2014 at Unknown time  . traMADol (ULTRAM) 50 MG tablet Take 100 mg by mouth every 6 (six) hours as needed for moderate pain or severe pain.   0 11/08/2014 at Unknown time  . vitamin E (VITAMIN E) 1000 UNIT capsule Take 1,000 Units by mouth daily.    11/09/2014 at Unknown time  . docusate sodium (COLACE) 100 MG capsule Take 1  capsule (100 mg total) by mouth 2 (two) times daily. (Patient not taking: Reported on 11/08/2014) 40 capsule 1 Not Taking at Unknown time  . feeding supplement, GLUCERNA SHAKE, (GLUCERNA SHAKE) LIQD Take 237 mLs by mouth 3 (three) times daily between meals. (Patient not taking: Reported on 11/09/2014)  0 Not Taking at Unknown time  . insulin glargine (LANTUS) 100 UNIT/ML injection Inject 0.35 mLs (35 Units total) into the skin daily. (Patient not taking: Reported on 11/08/2014) 15 mL 0 Not Taking at Unknown time   Scheduled:  .  ARIPiprazole  10 mg Oral Daily  . budesonide-formoterol  2 puff Inhalation BID  . emtricitabine-tenofovir AF  1 tablet Oral Q breakfast   And  . rilpivirine  25 mg Oral Q breakfast  . escitalopram  20 mg Oral Daily  . heparin subcutaneous  5,000 Units Subcutaneous 3 times per day  . insulin aspart  0-15 Units Subcutaneous TID WC  . insulin aspart  0-5 Units Subcutaneous QHS  . insulin glargine  70 Units Subcutaneous Daily  . methylphenidate  20 mg Oral Daily  . pantoprazole  40 mg Oral Daily  . piperacillin-tazobactam (ZOSYN)  IV  3.375 g Intravenous Q8H  . pregabalin  150 mg Oral BID  . rosuvastatin  20 mg Oral Daily  . sodium chloride  500 mL Intravenous Once  . vancomycin  1,250 mg Intravenous Q24H   Infusions:     Assessment: 50 yoF c/o ulcerations of left foot.  Zosyn/Vancomycin per Rx for cellulitis started 11/9. Note that there is no osteomyelitis but patient will require debridement for extent of infection and amputation was recommended  - WBC slightly elevated but stable - SCr improved from yesterday with est CrCl 44 ml/min - Afebrile  Goal of Therapy:  Vancomycin trough level 10-15 mcg/ml - will change goal trough to 15-20 mcg/mL due to extent of infection  Plan: Day 3 Antibiotics  Continue Zosyn 3.375 Gm IV q8h EI  Continue Vancomycin 1250mg  IV q24h but will check trough prior to next dose tonight at 8pm (prior to 4th dose)   Adrian Saran,  PharmD, BCPS Pager [phone] 11/12/2014 8:37 AM

## 2014-11-12 NOTE — Care Management Important Message (Signed)
Important Message  Patient Details  Name: KENESHA KUECHLE MRN: [id number] Date of Birth: 10-17-1965   Medicare Important Message Given:  Yes    Camillo Flaming 11/12/2014, 1:58 PMImportant Message  Patient Details  Name: JUNELL RAINIER MRN: [id number] Date of Birth: 06-04-1965   Medicare Important Message Given:  Yes    Camillo Flaming 11/12/2014, 1:57 PM

## 2014-11-12 NOTE — Progress Notes (Signed)
Gave transfer report to [place], ICU.

## 2014-11-12 NOTE — Progress Notes (Signed)
Initial Nutrition Assessment  DOCUMENTATION CODES:   Obesity unspecified  INTERVENTION:  Glucerna Shake po TID, each supplement provides 220 kcal and 10 grams of protein  NUTRITION DIAGNOSIS:   Inadequate oral intake related to chronic illness, poor appetite as evidenced by per patient/family report.  GOAL:   Patient will meet greater than or equal to 90% of their needs  MONITOR:   PO intake, Labs, I & O's, Supplement acceptance, Skin  REASON FOR ASSESSMENT:   Consult Wound healing  ASSESSMENT:   [name] is a 49 y.o. female With h/o IDDM2, HIV, obesity presented to [facility] ED due to left foot wound for the last two months, has progressively gotten worse, now with malodorous drainage, denies fever, no significant pain (h/o diabetic neuropathy), she was seen by her endocrinology , was told to come to the ED to have the wound evaluated, she did not come yesterday, but she came today.  Spoke with pt at bedside.  Admitted to poor appetite for no particular reason. Likely related to poor control of T2DM.  Spoke with pt about providing more calories to help with wound healing. Pt expressed interest in sugar free sweets, and agreed to try glucerna shake.  Nutrition-Focused physical exam completed. Findings are no fat depletion, no muscle depletion, and no edema.   With HIV, will be more difficult to encourage wound healing. Did not actually look at wound, spoke to nurse, said it appears black with drainage.  Will provide snacks, glucerna shake to help meet overall calorie needs and heal wound. Encouraged PO intake.   Diet Order:  Diet heart healthy/carb modified Room service appropriate?: Yes; Fluid consistency:: Thin  Skin:  Wound (see comment) (Diabetic foot ulcer.)  Last BM:  11/11/2014  Height:   Ht Readings from Last 1 Encounters:  11/10/14 5\' 10"  (1.778 m)    Weight:   Wt Readings from Last 1 Encounters:  11/10/14 242 lb 15.2 oz (110.2 kg)    Ideal Body  Weight:  75.45 kg  BMI:  Body mass index is 34.86 kg/(m^2).  Estimated Nutritional Needs:   Kcal:  2500-3000  Protein:  132-165 grams  Fluid:  >/= 2.5L  EDUCATION NEEDS:   No education needs identified at this time  [name]. Erin Clucas, MS, RD LDN After Hours/Weekend Pager [phone]

## 2014-11-12 NOTE — Progress Notes (Signed)
Patient BP L arm 67/41, R arm 90/44.  Two nurses attempted manual BP of left arm and unable to obtain manual BP.  T 98.7, Pulse 80, 96% room air.  Patient denies pain.  Respirations even and unlabored.  Patient reports her blood pressure differs in each arm.  Notifying physician.

## 2014-11-12 NOTE — Progress Notes (Signed)
[facility] Progress Note    Patient: [name]  [id number]  DOB: 13-Jan-1965  DOA: 11/09/2014 Date of Service: the patient was seen and examined on 11/12/2014  Subjective: Patient had another episode of hypotension overnight. Earlier in the morning she was complaining of fatigue and lethargic. No headache, chest pain and abdominal pain. The pain in the leg has not changed at all. She also complained of 3 episodes of loose watery bowel movement yesterday. Similar complaint of loose watery bowel movement earlier in the morning. Nutrition: He has been able to follow her diet adequately  Brief Summary of Hospitalization: Day 3 of admission Patient admitted on 11/09/2014 for nonhealing left foot ulcer, patient was started on vancomycin and Fortaz, orthopedic was consulted and currently we're following, MRI shows anaerobic cellulitis with probable subcutaneous emphysema. Orthopedic recommends plastic surgery consult, plastic surgery with attempted debridement but requested vascular surgery consultation  Assessment and Plan 1. Diabetic foot infection ([place])  Hypotension  Currently on vancomycin and Fortaz MRI shows no evidence of osteomyelitis and shows anaerobic cellulitis. X-ray shows possible subcutaneous emphysema. Orthopedic is currently recommending to consult plastic surgery for the patient. Plastic surgery Dr. Iran Planas currently recommends vascular surgery consultation before attempted debridement. Vascular surgery has evaluated the patient today and will perform angiogram on Monday and recommended hydration over weekend. Patient has worsening blood pressure control but she is not tachycardic lactic acid is normal there is no fever and her symptoms are not worsening does she is clinically not appearing septic. But with her persistent hypertension the patient will be transferred to step down unit for close monitoring I would give her albumin for resection  resuscitation. I would also continue normal saline ('s per hour. Patient has so far received 3 L of normal saline bolus in 24 hours.  Phone discussion with ID recommends narrow down the antibiotics to Unasyn to cover for gram-negative rods as well as gram-positive cocci without Staphylococcus. Once the patient is more hemodynamically stable will start narrowing down the antibiotics   2. Hypoension. antihypertensive medication has been discontinued Lasix has been discontinued. Patient is responding to volume and will continue with IV fluids. Cortisol level is mildly lower than expected values but within normal range. We'll continue close monitoring in stepdown unit. Also discontinue TriCor. Also reduced the dose Lyrica from 225 mg twice a day to 150 mg twice a day due to her renal function.  3.human immunodeficiency virus (HIV) disease ([place]) Currently continuing patient's home HIV medication CD4 count 09/29/2014 29%, 570 absolute  4  Diabetes mellitus type 2 with complications, uncontrolled ([place]) Diabetic nephropathy Diabetic neuropathy Pharmacy is consulted for management of antibiotic for renal dosing. Patient is currently on home Lantus and moderate sliding scale Hemoglobin A1c 8.4 07/08/2014 Recent hemoglobin A1c 10 showing poor control    Renal function stable discontinuing antihypertensive medication as well as TriCor  Avoid nephrotoxic medications  5  Dyslipidemia Continuing statin  6  Mood disorder ([place]) Continuing home medications including Abilify, Lexapro,  Due to persistent hypotension Klonopin changed to when necessary 3 times a day   7  Diastolic dysfunction Currently holding Lasix due to hypotension   8 expiratory wheezing  ABG does not show significant hypoxia. Chest x-ray is clear. BNP is normal. Probable bronchospasm. Will use DuoNeb's scheduled  DVT Prophylaxis: subcutaneous Heparin Nutrition: Diabetic diet  Advance goals of care discussion: Full  code  Disposition:  Discharge pending workup regarding angiogram as well as debridement. PTOT will be consulted post procedure.  Consultants: Orthopedics Plastic surgery   Antibiotics: Vancomycin and  Zosyn start date 11/09/2014   Intake/Output Summary (Last 24 hours) at 11/12/14 1645 Last data filed at 11/12/14 1500  Gross per 24 hour  Intake 1753.33 ml  Output      0 ml  Net 1753.33 ml   Filed Weights   11/10/14 0200  Weight: 110.2 kg (242 lb 15.2 oz)    Objective: Physical Exam: Filed Vitals:   11/12/14 1200 11/12/14 1205 11/12/14 1234 11/12/14 1346  BP:  118/53 97/38 139/48  Pulse:  94 86 79  Temp: 98.6 F (37 C)     TempSrc: Oral     Resp:  15 27 16   Height:      Weight:      SpO2:  97% 99% 99%     General: Appear in mild distress ENT: Oral Mucosa moist. Cardiovascular: S1 and S2 Present, no Murmur, Peripheral Pulses present Respiratory: Bilateral Air entry present and  bilateral expiratory wheezes Abdomen: Bowel Sound present, Soft and no tenderness Skin: No redness or warmth Large open ulcer on the left foot with eschar, area Marked today  Extremities: Bilateral trace Pedal edema, no calf tenderness Neurologic: Grossly no focal neuro deficit.  Data Reviewed: CBC:  Recent Labs Lab 11/09/14 1610 11/10/14 0555 11/11/14 0525 11/12/14 0547  WBC 14.6* 11.9* 13.7* 13.0*  NEUTROABS 12.5*  --  10.7* 10.1*  HGB 9.5* 9.6* 9.9* 9.1*  HCT 29.7* 31.3* 31.9* 29.7*  MCV 83.7 85.8 84.8 85.6  PLT 487* 478* 459* 0000000*   Basic Metabolic Panel:  Recent Labs Lab 11/09/14 1610 11/10/14 0555 11/11/14 0525 11/12/14 0547  NA 130* 135 134* 133*  K 4.3 3.5 3.8 4.3  CL 95* 94* 95* 97*  CO2 25 30 29 27   GLUCOSE 447* 158* 183* 228*  BUN 32* 30* 35* 34*  CREATININE 1.99* 2.00* 2.30* 2.04*  CALCIUM 9.4 9.7 9.4 9.1   Liver Function Tests:  Recent Labs Lab 11/10/14 0555 11/11/14 0525 11/12/14 0547  AST 13* 12* 11*  ALT 11* 9* 9*  ALKPHOS 102 92 79    BILITOT 0.6 0.5 0.4  PROT 7.8 7.6 6.9  ALBUMIN 2.6* 2.5* 2.3*   No results for input(s): LIPASE, AMYLASE in the last 168 hours. No results for input(s): AMMONIA in the last 168 hours.  Cardiac Enzymes:  Recent Labs Lab 11/12/14 0547  CKTOTAL 32*   BNP (last 3 results)  Recent Labs  11/12/14 0547  BNP 39.0    ProBNP (last 3 results) No results for input(s): PROBNP in the last 8760 hours.  CBG:  Recent Labs Lab 11/11/14 0723 11/11/14 1157 11/11/14 1739 11/11/14 2159 11/12/14 0737  GLUCAP 159* 195* 338* 231* 165*    Recent Results (from the past 240 hour(s))  MRSA PCR Screening     Status: None   Collection Time: 11/10/14  2:22 AM  Result Value Ref Range Status   MRSA by PCR NEGATIVE NEGATIVE Final    Comment:        The GeneXpert MRSA Assay (FDA approved for NASAL specimens only), is one component of a comprehensive MRSA colonization surveillance program. It is not intended to diagnose MRSA infection nor to guide or monitor treatment for MRSA infections.   Wound culture     Status: None   Collection Time: 11/10/14  2:22 AM  Result Value Ref Range Status   Specimen Description WOUND L FOOT  Final   Special Requests NONE  Final   Gram Stain  Final    NO WBC SEEN NO SQUAMOUS EPITHELIAL CELLS SEEN ABUNDANT GRAM POSITIVE COCCI IN PAIRS IN CLUSTERS ABUNDANT GRAM NEGATIVE RODS Performed at [facility]    Culture   Final    MULTIPLE ORGANISMS PRESENT, NONE PREDOMINANT Note: NO STAPHYLOCOCCUS AUREUS ISOLATED NO GROUP A STREP (S.PYOGENES) ISOLATED Performed at [facility]    Report Status 11/12/2014 FINAL  Final  MRSA PCR Screening     Status: None   Collection Time: 11/12/14 12:24 PM  Result Value Ref Range Status   MRSA by PCR NEGATIVE NEGATIVE Final    Comment:        The GeneXpert MRSA Assay (FDA approved for NASAL specimens only), is one component of a comprehensive MRSA colonization surveillance program. It is  not intended to diagnose MRSA infection nor to guide or monitor treatment for MRSA infections.      Studies: Dg Chest Port 1 View  11/12/2014  CLINICAL DATA:  Shortness of Breath EXAM: PORTABLE CHEST 1 VIEW COMPARISON:  July 08, 2014 FINDINGS: There is no edema or consolidation. Heart is mildly enlarged with pulmonary vascularity within normal limits. No adenopathy. There are healed rib fractures bilaterally. IMPRESSION: No edema or consolidation.  Stable cardiac prominence. Electronically Signed   By: Lowella Grip III M.D.   On: 11/12/2014 08:11    Scheduled Meds: . ARIPiprazole  10 mg Oral Daily  . aspirin  81 mg Oral Daily  . budesonide-formoterol  2 puff Inhalation BID  . emtricitabine-tenofovir AF  1 tablet Oral Q breakfast   And  . rilpivirine  25 mg Oral Q breakfast  . escitalopram  20 mg Oral Daily  . feeding supplement (GLUCERNA SHAKE)  237 mL Oral TID BM  . heparin subcutaneous  5,000 Units Subcutaneous 3 times per day  . insulin aspart  0-15 Units Subcutaneous TID WC  . insulin aspart  0-5 Units Subcutaneous QHS  . insulin glargine  70 Units Subcutaneous Daily  . ipratropium-albuterol  3 mL Nebulization Q6H  . methylphenidate  20 mg Oral Daily  . piperacillin-tazobactam (ZOSYN)  IV  3.375 g Intravenous Q8H  . pregabalin  150 mg Oral BID  . rosuvastatin  20 mg Oral Daily  . sodium chloride  500 mL Intravenous Once  . vancomycin  1,250 mg Intravenous Q24H   Continuous Infusions: . sodium chloride 100 mL/hr at 11/12/14 1500    Time spent: 25 minutes  Author: Berle Mull, MD [facility] Pager: [phone] 11/12/2014 4:45 PM  If 7PM-7AM, please contact night-coverage at [email or web address], password [facility]

## 2014-11-12 NOTE — Consult Note (Addendum)
Referred by:  [facility]  Reason for referral: left foot perfusion  History of Present Illness  [name] is a 49 y.o. ([birth year]) female who presents with chief complaint: large dorsal wound on left foot. The patient notes having some wounds in Left foot for nearly a year.  Initially she had an ulcer on her left heel.  She believes this ulcer has healed.  She had blistering of the dorsum of her left foot, which over the last 2 months progressed.  She notes relatively little pain in the left notes very limited sensation in the left foot.  She denies any drainage but she's not certain as her mother has been doing the wound care.  The patient denies any prior intermittent claudication or rest pain symptoms.  Her ambulation has been limited by his recovery from a hip fx.  Atherosclerotic risk factors include: DM, history of smoking until recently.  Past Medical History  Diagnosis Date  . Anxiety   . Depression   . Hypertension   . HIV infection ([place]) 2000  . Asthma     HOSPITALIZED WITH EXCERBATION OF ASTHMA - AND BRONCHITIS AND THE FLU DEC 2013  . Shortness of breath     ALLERGIES ARE "ACTING UP"  . Diabetes mellitus     ON INSULIN AND ORAL MEDICATIONS  . GERD (gastroesophageal reflux disease)   . Neuropathy     NEUROPATHY HANDS AND FEET  . Arthritis     LEFT HIP  . Pain     SEVERE PAIN LEFT HIP - HX OF LEFT HIP PINNING JULY 2013    Past Surgical History  Procedure Laterality Date  . Hip pinning,cannulated  07/16/2011    Procedure: CANNULATED HIP PINNING;  Surgeon: Gearlean Alf, MD;  Location: [facility];  Service: Orthopedics;  Laterality: Left;  . Hip pinning,cannulated Left 04/09/2012    Procedure: CANNULATED HIP PINNING AND HARDWARE REVISION;  Surgeon: Gearlean Alf, MD;  Location: [facility];  Service: Orthopedics;  Laterality: Left;  . Hip arthroplasty Left 05/05/2014    Procedure: ARTHROPLASTY OPEN REDUCTION INTERNAL FIXATION LEFT HIP AND REMOVAL OF TWO  CANNULATED SCREW;  Surgeon: Latanya Maudlin, MD;  Location: [facility];  Service: Orthopedics;  Laterality: Left;  . Cast application Left [id number]    Procedure: CAST APPLICATION (FIBERGLASS);  Surgeon: Latanya Maudlin, MD;  Location: [facility];  Service: Orthopedics;  Laterality: Left;  CLOSED REDUCTION OF LEFT COLLES FRACTURE WITH SHORT ARM CAST  . Hardware removal Left 05/05/2014    Procedure: HARDWARE REMOVAL LEFT HIP;  Surgeon: Latanya Maudlin, MD;  Location: [facility];  Service: Orthopedics;  Laterality: Left;  REMOVAL BIOMET 6.5-8.0 CANNULATED SCREW    Social History   Social History  . Marital Status: Legally Separated    Spouse Name: N/A  . Number of Children: N/A  . Years of Education: N/A   Occupational History  . Not on file.   Social History Main Topics  . Smoking status: Current Every Day Smoker -- 0.50 packs/day for 35 years    Types: E-cigarettes  . Smokeless tobacco: Never Used  . Alcohol Use: No     Comment: no drink since 2008  . Drug Use: No     Comment: hx of crack/cocaine, clean 8 years  . Sexual Activity: No   Other Topics Concern  . Not on file   Social History Narrative    Family History  Problem Relation Age of Onset  . Diabetes Mother   .  Hypertension Mother   . Vision loss Mother   . Heart disease Mother   . Hypertension Father   . Thyroid disease Neg Hx     Current Facility-Administered Medications  Medication Dose Route Frequency Provider Last Rate Last Dose  . 0.9 %  sodium chloride infusion   Intravenous Continuous Lavina Hamman, MD 100 mL/hr at 11/12/14 1500    . ARIPiprazole (ABILIFY) tablet 10 mg  10 mg Oral Daily Florencia Reasons, MD   10 mg at 11/12/14 1033  . budesonide-formoterol (SYMBICORT) 80-4.5 MCG/ACT inhaler 2 puff  2 puff Inhalation BID Florencia Reasons, MD   2 puff at 11/12/14 0900  . clonazePAM (KLONOPIN) tablet 0.5 mg  0.5 mg Oral TID PRN Lavina Hamman, MD      . cyclobenzaprine (FLEXERIL) tablet 10 mg  10 mg Oral BID PRN Florencia Reasons, MD   10 mg at  11/09/14 1943  . emtricitabine-tenofovir AF (DESCOVY) 200-25 MG per tablet 1 tablet  1 tablet Oral Q breakfast Florencia Reasons, MD   1 tablet at 11/12/14 0905   And  . rilpivirine (EDURANT) tablet 25 mg  25 mg Oral Q breakfast Florencia Reasons, MD   25 mg at 11/12/14 0905  . escitalopram (LEXAPRO) tablet 20 mg  20 mg Oral Daily Florencia Reasons, MD   20 mg at 11/12/14 1032  . feeding supplement (GLUCERNA SHAKE) (GLUCERNA SHAKE) liquid 237 mL  237 mL Oral TID BM Satira Anis Ward, RD   237 mL at 11/12/14 1400  . heparin injection 5,000 Units  5,000 Units Subcutaneous 3 times per day Lavina Hamman, MD   5,000 Units at 11/12/14 1436  . HYDROcodone-acetaminophen (NORCO) 10-325 MG per tablet 1 tablet  1 tablet Oral Q6H PRN Florencia Reasons, MD   1 tablet at 11/12/14 1320  . insulin aspart (novoLOG) injection 0-15 Units  0-15 Units Subcutaneous TID WC Gardiner Barefoot, NP   5 Units at 11/12/14 1223  . insulin aspart (novoLOG) injection 0-5 Units  0-5 Units Subcutaneous QHS Gardiner Barefoot, NP   2 Units at 11/11/14 2216  . insulin glargine (LANTUS) injection 70 Units  70 Units Subcutaneous Daily Florencia Reasons, MD   70 Units at 11/12/14 1033  . ipratropium-albuterol (DUONEB) 0.5-2.5 (3) MG/3ML nebulizer solution 3 mL  3 mL Nebulization Q6H Lavina Hamman, MD   3 mL at 11/12/14 1418  . methylphenidate (RITALIN) tablet 20 mg  20 mg Oral Daily Florencia Reasons, MD   20 mg at 11/12/14 1033  . piperacillin-tazobactam (ZOSYN) IVPB 3.375 g  3.375 g Intravenous Q8H Dorrene German, RPH   3.375 g at 11/12/14 1031  . pregabalin (LYRICA) capsule 150 mg  150 mg Oral BID Lavina Hamman, MD   150 mg at 11/12/14 1032  . rosuvastatin (CRESTOR) tablet 20 mg  20 mg Oral Daily Florencia Reasons, MD   20 mg at 11/12/14 1033  . sodium chloride 0.9 % bolus 500 mL  500 mL Intravenous Once Gardiner Barefoot, NP      . traMADol Veatrice Bourbon) tablet 100 mg  100 mg Oral Q6H PRN Florencia Reasons, MD   100 mg at 11/09/14 2208  . vancomycin (VANCOCIN) 1,250 mg in sodium chloride 0.9 % 250 mL IVPB   1,250 mg Intravenous Q24H Dorrene German, RPH   1,250 mg at 11/11/14 2120    Allergies  Allergen Reactions  . Cortizone-10 [Hydrocortisone] Rash    Per patient, given local injection at knee and developed  rash at local site.      REVIEW OF SYSTEMS:  (Positives checked otherwise negative)  CARDIOVASCULAR:   [ ]  chest pain,  [ ]  chest pressure,  [ ]  palpitations,  [ ]  shortness of breath when laying flat,  [ ]  shortness of breath with exertion,   [ ]  pain in feet when walking,  [ ]  pain in feet when laying flat, [ ]  history of blood clot in veins (DVT),  [ ]  history of phlebitis,  [ ]  swelling in legs,  [ ]  varicose veins  PULMONARY:   [ ]  productive cough,  [ ]  asthma,  [ ]  wheezing  NEUROLOGIC:   [ ]  weakness in arms or legs,  [ ]  numbness in arms or legs,  [ ]  difficulty speaking or slurred speech,  [ ]  temporary loss of vision in one eye,  [ ]  dizziness  HEMATOLOGIC:   [ ]  bleeding problems,  [ ]  problems with blood clotting too easily  MUSCULOSKEL:   [ ]  joint pain, [ ]  joint swelling  GASTROINTEST:   [ ]  vomiting blood,  [ ]  blood in stool     GENITOURINARY:   [ ]  burning with urination,  [ ]  blood in urine  PSYCHIATRIC:   [ ]  history of major depression  INTEGUMENTARY:   [ ]  rashes,  [x]  ulcers  CONSTITUTIONAL:   [ ]  fever,  [ ]  chills   For VQI Use Only  PRE-ADM LIVING: Home  AMB STATUS: Ambulatory  CAD Sx: None  PRIOR CHF: None  STRESS TEST: [x]  No, [ ]  Normal, [ ]  + ischemia, [ ]  + MI, [ ]  Both   Physical Examination  Filed Vitals:   11/12/14 1200 11/12/14 1205 11/12/14 1234 11/12/14 1346  BP:  118/53 97/38 139/48  Pulse:  94 86 79  Temp: 98.6 F (37 C)     TempSrc: Oral     Resp:  15 27 16   Height:      Weight:      SpO2:  97% 99% 99%   Body mass index is 34.86 kg/(m^2).  General: A&O x 3, WD, Obese,   Head: [place]/AT  Ear/Nose/Throat: Hearing grossly intact, nares w/o erythema or drainage, oropharynx w/o  Erythema/Exudate, Mallampati score: 3  Eyes: PERRLA, EOMI  Neck: Supple, no nuchal rigidity, no palpable LAD  Pulmonary: Sym exp, good air movt, CTAB, no rales, rhonchi, & wheezing  Cardiac: distal HS, RRR, Nl S1, S2, no Murmurs, rubs or gallops  Vascular: Vessel Right Left  Radial Not Palpable Not Palpable  Ulnar Not Palpable Not Palpable  Brachial Faintly Palpable Faintly Palpable  Carotid Palpable, without bruit Palpable, without bruit  Aorta Not palpable N/A  Femoral Palpable Palpable  Popliteal Faintly palpable Faintly palpable  PT Not Palpable Not Palpable  DP Faintly Palpable Faintly Palpable   Gastrointestinal: soft, NTND, -G/R, - HSM, - masses, - CVAT B  Musculoskeletal: M/S 5/5 throughout including DF/PF, Extremities without ischemic changes , extensive dorsal L foot eschar extending onto distal L leg, erythema extending up to mid-calf L leg, both feet warm  Neurologic: CN 2-12 intact , Pain and light touch intact in extremities except limited sensation from mid-calf down, Motor exam as listed above  Psychiatric: Judgment intact, Mood & affect appropriate for pt's clinical situation  Dermatologic: See M/S exam for extremity exam, no rashes otherwise noted  Lymph : No Cervical, Axillary, or Inguinal lymphadenopathy   Radiology: Dg Chest Port 1 View  11/12/2014  CLINICAL  DATA:  Shortness of Breath EXAM: PORTABLE CHEST 1 VIEW COMPARISON:  July 08, 2014 FINDINGS: There is no edema or consolidation. Heart is mildly enlarged with pulmonary vascularity within normal limits. No adenopathy. There are healed rib fractures bilaterally. IMPRESSION: No edema or consolidation.  Stable cardiac prominence. Electronically Signed   By: Lowella Grip III M.D.   On: 11/12/2014 08:11    Laboratory: CBC:    Component Value Date/Time   WBC 13.0* 11/12/2014 0547   RBC 3.47* 11/12/2014 0547   HGB 9.1* 11/12/2014 0547   HCT 29.7* 11/12/2014 0547   PLT 489* 11/12/2014 0547   MCV  85.6 11/12/2014 0547   MCH 26.2 11/12/2014 0547   MCHC 30.6 11/12/2014 0547   RDW 17.6* 11/12/2014 0547   LYMPHSABS 1.7 11/12/2014 0547   MONOABS 0.8 11/12/2014 0547   EOSABS 0.3 11/12/2014 0547   BASOSABS 0.0 11/12/2014 0547    BMP:    Component Value Date/Time   NA 133* 11/12/2014 0547   K 4.3 11/12/2014 0547   CL 97* 11/12/2014 0547   CO2 27 11/12/2014 0547   GLUCOSE 228* 11/12/2014 0547   BUN 34* 11/12/2014 0547   CREATININE 2.04* 11/12/2014 0547   CREATININE 1.81* 09/28/2014 1536   CALCIUM 9.1 11/12/2014 0547   GFRNONAA 27* 11/12/2014 0547   GFRNONAA 39* 12/09/2013 1533   GFRAA 32* 11/12/2014 0547   GFRAA 45* 12/09/2013 1533    Coagulation: Lab Results  Component Value Date   INR 1.27 11/10/2014   INR 1.11 07/08/2014   INR 1.04 04/07/2012   No results found for: PTT  Lipids:    Component Value Date/Time   CHOL 146 09/28/2014 1536   TRIG 130 09/28/2014 1536   HDL 45* 09/28/2014 1536   CHOLHDL 3.2 09/28/2014 1536   VLDL 26 09/28/2014 1536   LDLCALC 75 09/28/2014 1536       Medical Decision Making  Sunday Spillers Benally is a 49 y.o. female who presents with: Left loot dorsal ulcer, hypotension likely multifactorial   I don't believe her left foot is salvageable.  I think debridement of the eschar would likely reveal tendons.  Patient's hypotension may represent ascending cellulitis resulting in bacteremia.  Broad spectrum antibiotics recommended.  Patient is reluctant to proceed with a L BKA.  Would obtained BLE ABI to get a more objective measure of the flow in both legs.  If abnormal, would consider LLE angiography to determine any reconstruction options.  I discussed in depth with the patient the nature of atherosclerosis, and emphasized the importance of maximal medical management including strict control of blood pressure, blood glucose, and lipid levels, antiplatelet agents, obtaining regular exercise, and cessation of smoking.  The patient is  aware that without maximal medical management the underlying atherosclerotic disease process will progress, limiting the benefit of any interventions. The patient is currently on a statin:  Crestor. The patient is currently not on an anti-platelet.  The patient will be started on ASA 81 mg PO daily.  Thank you for allowing [place] to participate in this patient's care.   Adele Barthel, MD [facility] Office: [phone] Pager: [phone]  11/12/2014, 3:09 PM    Addendum  VASCULAR LAB PRELIMINARY  ARTERIAL  ABI completed: Left BP significantly lower than Right BP. Patient has known lower Left BP.    RIGHT    LEFT    PRESSURE WAVEFORM  PRESSURE WAVEFORM  BRACHIAL 112 Tri BRACHIAL 76 Tri  DP   DP  AT 11 Tri AT Could not obtain due to ulcer location   PT 91 Tri PT 63 [place]  PER   PER 59 [place]  GREAT TOE  NA GREAT TOE  NA    RIGHT LEFT  ABI 0.91, mild arterial disease 0.53, severe arterial disease        - will offer patient angiography on Monday - rehydration and resuscitation over the weekend   Adele Barthel, MD [facility] [place] Office: [phone] Pager: [phone]  11/12/2014, 3:24 PM

## 2014-11-12 NOTE — Progress Notes (Signed)
Per patient request, notified pt mother, Maricela Bo, [phone], that pt is being transferred to Room 1222.

## 2014-11-12 NOTE — Progress Notes (Signed)
Physician at bedside assessing patient.  Physician reports he is transferring patient to stepdown.

## 2014-11-12 NOTE — Progress Notes (Signed)
Inpatient Diabetes Program Recommendations  AACE/ADA: New Consensus Statement on Inpatient Glycemic Control (2015)  Target Ranges:  Prepandial:   less than 140 mg/dL      Peak postprandial:   less than 180 mg/dL (1-2 hours)      Critically ill patients:  140 - 180 mg/dL    Results for [name], REH (MRN [id number]) as of 11/12/2014 09:30  Ref. Range 11/11/2014 07:23 11/11/2014 11:57 11/11/2014 17:39 11/11/2014 21:59  Glucose-Capillary Latest Ref Range: 65-99 mg/dL 159 (H) 195 (H) 338 (H) 231 (H)    Home DM Meds: Toujeo 70 units daily  Novolog 20 units tidwc  Current Insulin Orders: Lantus 70 units daily  Novolog Moderate SSI (0-15 units) TID AC + HS     -Note patient saw Dr. Elayne Snare [facility]) on 11/08/14. During that visit, glucose levels were reviewed and patient was given instructions to change her DM medications to the following: 1. Increase Toujeo insulin to 50 units bid 2. Increase Novolog to 20 units tid with meals  3. Start taking Novolog 5 units for every 50 mg/dl above CBG of 200 mg/dl 4. Start Victoza 0.6 mg once daily--Increase Victoza dose to 1.2 mg daily after one week   -Current A1c shows very poor control at home= 10.7%.  -Will need to optimize glucose control here in the hospital to help patient begin healing.     MD- If patient continues to have elevated postprandial glucose levels, please consider starting 25% of patients home dose of Novolog-  Novolog 5 units tid with meals    --Will follow patient during hospitalization--  Wyn Quaker RN, MSN, CDE Diabetes Coordinator Inpatient Glycemic Control Team Team Pager: [phone] (8a-5p)

## 2014-11-12 NOTE — Progress Notes (Signed)
VASCULAR LAB PRELIMINARY  ARTERIAL  ABI completed: Left BP significantly lower than Right BP. Patient has known lower Left BP.    RIGHT    LEFT    PRESSURE WAVEFORM  PRESSURE WAVEFORM  BRACHIAL 112 Tri BRACHIAL 76 Tri  DP   DP    AT 102 Tri AT Could not obtain due to ulcer location   PT 91 Tri PT 63 [place]  PER   PER 59 [place]  GREAT TOE  NA GREAT TOE  NA    RIGHT LEFT  ABI 0.91, mild arterial disease 0.53, severe arterial disease     Landry Mellow, RDMS, RVT  11/12/2014, 3:07 PM

## 2014-11-13 DIAGNOSIS — D638 Anemia in other chronic diseases classified elsewhere: Secondary | ICD-10-CM

## 2014-11-13 LAB — CBC WITH DIFFERENTIAL/PLATELET
BASOS PCT: 0 %
BASOS PCT: 0 %
Basophils Absolute: 0 10*3/uL (ref 0.0–0.1)
Basophils Absolute: 0 10*3/uL (ref 0.0–0.1)
EOS PCT: 3 %
EOS PCT: 4 %
Eosinophils Absolute: 0.4 10*3/uL (ref 0.0–0.7)
Eosinophils Absolute: 0.5 10*3/uL (ref 0.0–0.7)
HCT: 27.4 % — ABNORMAL LOW (ref 36.0–46.0)
HEMATOCRIT: 25.9 % — AB (ref 36.0–46.0)
Hemoglobin: 7.9 g/dL — ABNORMAL LOW (ref 12.0–15.0)
Hemoglobin: 8.5 g/dL — ABNORMAL LOW (ref 12.0–15.0)
LYMPHS ABS: 1.8 10*3/uL (ref 0.7–4.0)
Lymphocytes Relative: 15 %
Lymphocytes Relative: 16 %
Lymphs Abs: 1.8 10*3/uL (ref 0.7–4.0)
MCH: 25.8 pg — ABNORMAL LOW (ref 26.0–34.0)
MCH: 26.7 pg (ref 26.0–34.0)
MCHC: 30.5 g/dL (ref 30.0–36.0)
MCHC: 31 g/dL (ref 30.0–36.0)
MCV: 84.6 fL (ref 78.0–100.0)
MCV: 86.2 fL (ref 78.0–100.0)
MONO ABS: 0.8 10*3/uL (ref 0.1–1.0)
MONO ABS: 0.7 10*3/uL (ref 0.1–1.0)
MONOS PCT: 6 %
Monocytes Relative: 6 %
NEUTROS ABS: 8.9 10*3/uL — AB (ref 1.7–7.7)
NEUTROS ABS: 8.3 10*3/uL — AB (ref 1.7–7.7)
NEUTROS PCT: 74 %
Neutrophils Relative %: 76 %
PLATELETS: 459 10*3/uL — AB (ref 150–400)
Platelets: 459 10*3/uL — ABNORMAL HIGH (ref 150–400)
RBC: 3.06 MIL/uL — ABNORMAL LOW (ref 3.87–5.11)
RBC: 3.18 MIL/uL — ABNORMAL LOW (ref 3.87–5.11)
RDW: 17.5 % — ABNORMAL HIGH (ref 11.5–15.5)
RDW: 17.6 % — ABNORMAL HIGH (ref 11.5–15.5)
WBC: 11.8 10*3/uL — ABNORMAL HIGH (ref 4.0–10.5)
WBC: 11.3 10*3/uL — ABNORMAL HIGH (ref 4.0–10.5)

## 2014-11-13 LAB — IRON AND TIBC
Iron: 20 ug/dL — ABNORMAL LOW (ref 28–170)
SATURATION RATIOS: 9 % — AB (ref 10.4–31.8)
TIBC: 231 ug/dL — AB (ref 250–450)
UIBC: 211 ug/dL

## 2014-11-13 LAB — RETICULOCYTES
RBC.: 3.18 MIL/uL — ABNORMAL LOW (ref 3.87–5.11)
Retic Count, Absolute: 41.3 10*3/uL (ref 19.0–186.0)
Retic Ct Pct: 1.3 % (ref 0.4–3.1)

## 2014-11-13 LAB — GLUCOSE, CAPILLARY
GLUCOSE-CAPILLARY: 173 mg/dL — AB (ref 65–99)
Glucose-Capillary: 153 mg/dL — ABNORMAL HIGH (ref 65–99)
Glucose-Capillary: 170 mg/dL — ABNORMAL HIGH (ref 65–99)
Glucose-Capillary: 330 mg/dL — ABNORMAL HIGH (ref 65–99)

## 2014-11-13 LAB — COMPREHENSIVE METABOLIC PANEL
ALBUMIN: 2.2 g/dL — AB (ref 3.5–5.0)
ALK PHOS: 80 U/L (ref 38–126)
ALT: 9 U/L — AB (ref 14–54)
AST: 10 U/L — AB (ref 15–41)
Anion gap: 6 (ref 5–15)
BILIRUBIN TOTAL: 0.6 mg/dL (ref 0.3–1.2)
BUN: 27 mg/dL — AB (ref 6–20)
CALCIUM: 8.7 mg/dL — AB (ref 8.9–10.3)
CO2: 26 mmol/L (ref 22–32)
Chloride: 99 mmol/L — ABNORMAL LOW (ref 101–111)
Creatinine, Ser: 1.71 mg/dL — ABNORMAL HIGH (ref 0.44–1.00)
GFR calc Af Amer: 39 mL/min — ABNORMAL LOW (ref >60)
GFR calc non Af Amer: 34 mL/min — ABNORMAL LOW (ref >60)
GLUCOSE: 195 mg/dL — AB (ref 65–99)
Potassium: 3.8 mmol/L (ref 3.5–5.1)
Sodium: 131 mmol/L — ABNORMAL LOW (ref 135–145)
TOTAL PROTEIN: 6.5 g/dL (ref 6.5–8.1)

## 2014-11-13 LAB — FERRITIN: Ferritin: 105 ng/mL (ref 11–307)

## 2014-11-13 LAB — LACTATE DEHYDROGENASE: LDH: 69 U/L — AB (ref 98–192)

## 2014-11-13 MED ORDER — INSULIN ASPART 100 UNIT/ML ~~LOC~~ SOLN
0.0000 [IU] | Freq: Every day | SUBCUTANEOUS | Status: DC
Start: 2014-11-13 — End: 2014-11-15
  Administered 2014-11-14 (×2): 3 [IU] via SUBCUTANEOUS

## 2014-11-13 MED ORDER — INSULIN ASPART 100 UNIT/ML ~~LOC~~ SOLN
0.0000 [IU] | Freq: Three times a day (TID) | SUBCUTANEOUS | Status: DC
  Administered 2014-11-13 – 2014-11-14 (×4): 4 [IU] via SUBCUTANEOUS
  Administered 2014-11-14: 15 [IU] via SUBCUTANEOUS
  Administered 2014-11-14: 7 [IU] via SUBCUTANEOUS

## 2014-11-13 NOTE — Progress Notes (Signed)
[facility] Progress Note    Patient: [name]  [id number]  DOB: Mar 17, 1965  DOA: 11/09/2014 Date of Service: the patient was seen and examined on 11/13/2014  Subjective: Patient did not complain of any more watery bowel movement. Does not have any complains of abdominal pain chest pain headache or dizziness. Continues to have some tiredness. Nutrition: Appetite has been good.  Brief Summary of Hospitalization: Day 4 of admission Patient admitted on 11/09/2014 for nonhealing left foot ulcer, patient was started on vancomycin and Fortaz, orthopedic was consulted and currently we're following, MRI shows anaerobic cellulitis with probable subcutaneous emphysema. Orthopedic recommends plastic surgery consult, plastic surgery with attempted debridement but requested vascular surgery consultation. Patient was transferred to step down unit because of persistent hypotension not responding to IV fluids.  Assessment and Plan 1. Diabetic foot infection ([place])  Hypotension  Cannot rule out sepsis  Currently on vancomycin and Fortaz MRI shows no evidence of osteomyelitis and shows anaerobic cellulitis. X-ray shows possible subcutaneous emphysema. Orthopedic is currently recommending to consult plastic surgery for the patient. Plastic surgery Dr. Iran Planas currently recommends vascular surgery consultation before attempted debridement. Vascular surgery has evaluated the patient today and will perform angiogram on Monday and recommended hydration over weekend. Plastic surgery will perform wound debridement with possible wound VAC placement on Tuesday.  Her blood pressure has significantly improved and continues to remain stable. would continue to monitor this patient in the step down unit tonight and will be transitioned her to telemetry floor in the morning should her blood pressure continues to remain stable. continue normal saline.  Phone discussion with ID recommends narrow  down the antibiotics to Unasyn to cover for gram-negative rods as well as gram-positive cocci without Staphylococcus. Once the patient is more hemodynamically stable will start narrowing down the antibiotics   2. Hypoension. antihypertensive medication has been discontinued Lasix has been discontinued. Patient is responding to volume and will continue with IV fluids. Cortisol level is mildly lower than expected values but within normal range. We'll continue close monitoring in stepdown unit. unclear etiology most like a medication induced, but sepsis cannot be ruled out   3.human immunodeficiency virus (HIV) disease ([place]) Currently continuing patient's home HIV medication CD4 count 09/29/2014 29%, 570 absolute  4  Diabetes mellitus type 2 with complications, uncontrolled ([place]) Diabetic nephropathy Diabetic neuropathy patient will have been marginally elevated in the morning. Sliding scale changed from moderate to resistant. Patient is currently on home Lantus Hemoglobin A1c 8.4 07/08/2014 Recent hemoglobin A1c 10 showing poor control    5 chronic kidney disease  Pharmacy is consulted for management of antibiotic for renal dosing. Renal function improving with hydration.  discontinuing antihypertensive medication as well as TriCor  Avoid nephrotoxic medications  6  Dyslipidemia Continuing statin  7  Mood disorder ([name]) Continuing home medications including Abilify, Lexapro,  Due to persistent hypotension Klonopin changed to when necessary 3 times a day   8  Diastolic dysfunction Currently holding Lasix due to hypotension monitor ins and outs.   9 expiratory wheezing, Probable bronchospasm. currently resolved, Continue DuoNeb  ABG does not show significant hypoxia. Chest x-ray is clear. BNP is normal.  10 Anemia patient's H&H dropped earlier in the morning. recheck appears to be stable. Most likely lab error. We will daily monitor CBC Check iron studies, probably have  anemia of chronic disease.   DVT Prophylaxis: subcutaneous Heparin Nutrition: Diabetic diet  Advance goals of care discussion: Full code  Disposition:  Discharge pending workup  regarding angiogram as well as debridement. PTOT will be consulted post procedure.  Consultants: Orthopedics Plastic surgery  Vascular surgery  Antibiotics: Vancomycin and  Zosyn start date 11/09/2014   Intake/Output Summary (Last 24 hours) at 11/13/14 1742 Last data filed at 11/13/14 1400  Gross per 24 hour  Intake   2720 ml  Output   2200 ml  Net    520 ml   Filed Weights   11/10/14 0200 11/13/14 0800  Weight: 110.2 kg (242 lb 15.2 oz) 109.1 kg (240 lb 8.4 oz)    Objective: Physical Exam: Filed Vitals:   11/13/14 0800 11/13/14 1021 11/13/14 1100 11/13/14 1200  BP:  101/54  108/57  Pulse: 102 62 71 78  Temp: 97.6 F (36.4 C)   97.8 F (36.6 C)  TempSrc: Oral   Oral  Resp: 14 14 14 12   Height:      Weight: 109.1 kg (240 lb 8.4 oz)     SpO2: 98% 97% 98% 96%     General: Appear in mild distress ENT: Oral Mucosa moist. Cardiovascular: S1 and S2 Present, no Murmur, Peripheral Pulses present Respiratory: Bilateral Air entry present and  No wheezes Abdomen: Bowel Sound present, Soft and no tenderness Skin: Large open ulcer on the left foot with eschar, marked area  remaining stable.  Extremities: Bilateral trace Pedal edema, no calf tenderness  Data Reviewed: CBC:  Recent Labs Lab 11/09/14 1610 11/10/14 0555 11/11/14 0525 11/12/14 0547 11/13/14 0420 11/13/14 1157  WBC 14.6* 11.9* 13.7* 13.0* 11.8* 11.3*  NEUTROABS 12.5*  --  10.7* 10.1* 8.9* 8.3*  HGB 9.5* 9.6* 9.9* 9.1* 7.9* 8.5*  HCT 29.7* 31.3* 31.9* 29.7* 25.9* 27.4*  MCV 83.7 85.8 84.8 85.6 84.6 86.2  PLT 487* 478* 459* 489* 459* [id number]   Basic Metabolic Panel:  Recent Labs Lab 11/10/14 0555 11/11/14 0525 11/12/14 0547 11/12/14 2238 11/13/14 0420  NA 135 134* 133* 134* 131*  K 3.5 3.8 4.3 4.1 3.8  CL 94* 95* 97*  101 99*  CO2 30 29 27 26 26   GLUCOSE 158* 183* 228* 321* 195*  BUN 30* 35* 34* 28* 27*  CREATININE 2.00* 2.30* 2.04* 1.73* 1.71*  CALCIUM 9.7 9.4 9.1 8.7* 8.7*   Liver Function Tests:  Recent Labs Lab 11/10/14 0555 11/11/14 0525 11/12/14 0547 11/13/14 0420  AST 13* 12* 11* 10*  ALT 11* 9* 9* 9*  ALKPHOS 102 92 79 80  BILITOT 0.6 0.5 0.4 0.6  PROT 7.8 7.6 6.9 6.5  ALBUMIN 2.6* 2.5* 2.3* 2.2*   No results for input(s): LIPASE, AMYLASE in the last 168 hours. No results for input(s): AMMONIA in the last 168 hours.  Cardiac Enzymes:  Recent Labs Lab 11/12/14 0547  CKTOTAL 32*   BNP (last 3 results)  Recent Labs  11/12/14 0547  BNP 39.0    ProBNP (last 3 results) No results for input(s): PROBNP in the last 8760 hours.  CBG:  Recent Labs Lab 11/12/14 2155 11/13/14 0018 11/13/14 0728 11/13/14 1203 11/13/14 1616  GLUCAP 432* 330* 153* 173* 170*    Recent Results (from the past 240 hour(s))  MRSA PCR Screening     Status: None   Collection Time: 11/10/14  2:22 AM  Result Value Ref Range Status   MRSA by PCR NEGATIVE NEGATIVE Final    Comment:        The GeneXpert MRSA Assay (FDA approved for NASAL specimens only), is one component of a comprehensive MRSA colonization surveillance program. It is not intended  to diagnose MRSA infection nor to guide or monitor treatment for MRSA infections.   Wound culture     Status: None   Collection Time: 11/10/14  2:22 AM  Result Value Ref Range Status   Specimen Description WOUND L FOOT  Final   Special Requests NONE  Final   Gram Stain   Final    NO WBC SEEN NO SQUAMOUS EPITHELIAL CELLS SEEN ABUNDANT GRAM POSITIVE COCCI IN PAIRS IN CLUSTERS ABUNDANT GRAM NEGATIVE RODS Performed at [facility]    Culture   Final    MULTIPLE ORGANISMS PRESENT, NONE PREDOMINANT Note: NO STAPHYLOCOCCUS AUREUS ISOLATED NO GROUP A STREP (S.PYOGENES) ISOLATED Performed at [facility]    Report Status  11/12/2014 FINAL  Final  MRSA PCR Screening     Status: None   Collection Time: 11/12/14 12:24 PM  Result Value Ref Range Status   MRSA by PCR NEGATIVE NEGATIVE Final    Comment:        The GeneXpert MRSA Assay (FDA approved for NASAL specimens only), is one component of a comprehensive MRSA colonization surveillance program. It is not intended to diagnose MRSA infection nor to guide or monitor treatment for MRSA infections.      Studies: Dg Chest Port 1 View  11/12/2014  CLINICAL DATA:  Shortness of Breath EXAM: PORTABLE CHEST 1 VIEW COMPARISON:  July 08, 2014 FINDINGS: There is no edema or consolidation. Heart is mildly enlarged with pulmonary vascularity within normal limits. No adenopathy. There are healed rib fractures bilaterally. IMPRESSION: No edema or consolidation.  Stable cardiac prominence. Electronically Signed   By: Lowella Grip III M.D.   On: 11/12/2014 08:11    Scheduled Meds: . ARIPiprazole  10 mg Oral Daily  . aspirin  81 mg Oral Daily  . budesonide-formoterol  2 puff Inhalation BID  . emtricitabine-tenofovir AF  1 tablet Oral Q breakfast   And  . rilpivirine  25 mg Oral Q breakfast  . escitalopram  20 mg Oral Daily  . feeding supplement (GLUCERNA SHAKE)  237 mL Oral TID BM  . heparin subcutaneous  5,000 Units Subcutaneous 3 times per day  . insulin aspart  0-20 Units Subcutaneous TID WC  . insulin aspart  0-5 Units Subcutaneous QHS  . insulin glargine  70 Units Subcutaneous Daily  . ipratropium-albuterol  3 mL Nebulization BID  . methylphenidate  20 mg Oral Daily  . piperacillin-tazobactam (ZOSYN)  IV  3.375 g Intravenous Q8H  . pregabalin  150 mg Oral BID  . rosuvastatin  20 mg Oral Daily  . sodium chloride  500 mL Intravenous Once  . vancomycin  1,000 mg Intravenous Q24H   Continuous Infusions: . sodium chloride 100 mL/hr at 11/13/14 0040    Time spent: 25 minutes  Author: Berle Mull, MD [facility] Pager: [phone] 11/13/2014  5:42 PM  If 7PM-7AM, please contact night-coverage at [email or web address], password [facility]

## 2014-11-13 NOTE — Progress Notes (Signed)
   Daily Progress Note  I had a 15 minute conversation with this patient about the proposed Aortogram, left leg runoff, possible left leg intervention scheduled for this coming Monday afternoon.  I discussed with the patient the nature of angiographic procedures, especially the limited patencies of any endovascular intervention.  The patient is aware of that the risks of an angiographic procedure include but are not limited to: bleeding, infection, access site complications, renal failure, embolization, rupture of vessel, dissection, possible need for emergent surgical intervention, possible need for surgical procedures to treat the patient's pathology, anaphylactic reaction to contrast, and stroke and death.  The patient is aware of the risks and agrees to proceed.  - Pt continues to have somewhat unrealistic expectations.  I tried to give the patient a more realistic understanding of the nature of vascular reconstruction and surgical salvage of her leg. - She expressed concerns with pain over local anesthetic injections for the angiographic picture.  I discussed with her the need for likely multiple procedures to debride her foot and need for extensive dressing changes in an attempt to salvage her foot. - I was also frank with her that I did not expect the foot to be salvageable. - please start IV rehydration of the patient tomorrow in anticipation of the IV contrast exposure for the angiographic procedure  BMET    Component Value Date/Time   NA 131* 11/13/2014 0420   K 3.8 11/13/2014 0420   CL 99* 11/13/2014 0420   CO2 26 11/13/2014 0420   GLUCOSE 195* 11/13/2014 0420   BUN 27* 11/13/2014 0420   CREATININE 1.71* 11/13/2014 0420   CREATININE 1.81* 09/28/2014 1536   CALCIUM 8.7* 11/13/2014 0420   GFRNONAA 34* 11/13/2014 0420   GFRNONAA 39* 12/09/2013 1533   GFRAA 39* 11/13/2014 0420   GFRAA 45* 12/09/2013 1533     Adele Barthel, MD [facility] Office:  [phone] Pager: [phone]  11/13/2014, 7:31 PM

## 2014-11-13 NOTE — Progress Notes (Signed)
  Plastic Surgery  Events noted. On enteric precautions but no further diarrhea.  Temp:  [97.6 F (36.4 C)-98.7 F (37.1 C)] 97.6 F (36.4 C) (11/12 0800) Pulse Rate:  [74-112] 102 (11/12 0800) Resp:  [13-27] 14 (11/12 0800) BP: (67-140)/(36-65) 104/36 mmHg (11/12 0400) SpO2:  [72 %-100 %] 72 % (11/12 0800) Weight:  [109.1 kg (240 lb 8.4 oz)] 109.1 kg (240 lb 8.4 oz) (11/12 0800)   PE Very lethargic and sleepy states did not sleep at night LLE dry dressing in place over foot, distal leg with unchanged erythema, some eschar over dorsalankle now  A/P Scheduled for debridement and possible VAC placement on 11/16/14 with myself. Dr. Bridgett Larsson plans angiogram on 11/14 and will follow up post this. She has severe arterial disease and I agree with other consultants that may require amputation. She does not want this presently.  Irene Limbo, MD [facility] [phone]

## 2014-11-14 ENCOUNTER — Other Ambulatory Visit (HOSPITAL_COMMUNITY): Payer: Medicare HMO

## 2014-11-14 LAB — CBC WITH DIFFERENTIAL/PLATELET
BASOS ABS: 0 10*3/uL (ref 0.0–0.1)
BASOS PCT: 0 %
EOS ABS: 0.4 10*3/uL (ref 0.0–0.7)
EOS PCT: 3 %
HCT: 27.6 % — ABNORMAL LOW (ref 36.0–46.0)
HEMOGLOBIN: 8.6 g/dL — AB (ref 12.0–15.0)
Lymphocytes Relative: 16 %
Lymphs Abs: 1.9 10*3/uL (ref 0.7–4.0)
MCH: 26.7 pg (ref 26.0–34.0)
MCHC: 31.2 g/dL (ref 30.0–36.0)
MCV: 85.7 fL (ref 78.0–100.0)
Monocytes Absolute: 0.7 10*3/uL (ref 0.1–1.0)
Monocytes Relative: 6 %
NEUTROS PCT: 75 %
Neutro Abs: 9.2 10*3/uL — ABNORMAL HIGH (ref 1.7–7.7)
PLATELETS: 478 10*3/uL — AB (ref 150–400)
RBC: 3.22 MIL/uL — AB (ref 3.87–5.11)
RDW: 17.6 % — ABNORMAL HIGH (ref 11.5–15.5)
WBC: 12.3 10*3/uL — AB (ref 4.0–10.5)

## 2014-11-14 LAB — GLUCOSE, CAPILLARY
GLUCOSE-CAPILLARY: 196 mg/dL — AB (ref 65–99)
Glucose-Capillary: 277 mg/dL — ABNORMAL HIGH (ref 65–99)
Glucose-Capillary: 223 mg/dL — ABNORMAL HIGH (ref 65–99)
Glucose-Capillary: 324 mg/dL — ABNORMAL HIGH (ref 65–99)

## 2014-11-14 LAB — C DIFFICILE QUICK SCREEN W PCR REFLEX
C DIFFICILE (CDIFF) INTERP: NEGATIVE
C Diff antigen: NEGATIVE
C Diff toxin: NEGATIVE

## 2014-11-14 LAB — COMPREHENSIVE METABOLIC PANEL
ALBUMIN: 2.4 g/dL — AB (ref 3.5–5.0)
ALT: 7 U/L — AB (ref 14–54)
AST: 11 U/L — AB (ref 15–41)
Alkaline Phosphatase: 72 U/L (ref 38–126)
Anion gap: 8 (ref 5–15)
BUN: 27 mg/dL — ABNORMAL HIGH (ref 6–20)
CHLORIDE: 99 mmol/L — AB (ref 101–111)
CO2: 26 mmol/L (ref 22–32)
CREATININE: 1.71 mg/dL — AB (ref 0.44–1.00)
Calcium: 9 mg/dL (ref 8.9–10.3)
GFR calc non Af Amer: 34 mL/min — ABNORMAL LOW (ref >60)
GFR, EST AFRICAN AMERICAN: 39 mL/min — AB (ref >60)
GLUCOSE: 220 mg/dL — AB (ref 65–99)
Potassium: 4.2 mmol/L (ref 3.5–5.1)
SODIUM: 133 mmol/L — AB (ref 135–145)
Total Bilirubin: 0.4 mg/dL (ref 0.3–1.2)
Total Protein: 6.9 g/dL (ref 6.5–8.1)

## 2014-11-14 MED ORDER — HYDROCODONE-ACETAMINOPHEN 10-325 MG PO TABS
1.0000 | ORAL_TABLET | ORAL | Status: DC | PRN
  Administered 2014-11-14 – 2014-11-18 (×10): 1 via ORAL
  Filled 2014-11-14 (×10): qty 1

## 2014-11-14 MED ORDER — ZOLPIDEM TARTRATE 5 MG PO TABS
2.5000 mg | ORAL_TABLET | Freq: Every evening | ORAL | Status: AC | PRN
  Administered 2014-11-14: 2.5 mg via ORAL
  Filled 2014-11-14: qty 1

## 2014-11-14 MED ORDER — LOPERAMIDE HCL 2 MG PO CAPS
2.0000 mg | ORAL_CAPSULE | Freq: Three times a day (TID) | ORAL | Status: DC | PRN
  Administered 2014-11-14 – 2014-11-22 (×8): 2 mg via ORAL
  Filled 2014-11-14 (×9): qty 1

## 2014-11-14 MED ORDER — SODIUM CHLORIDE 0.9 % IV BOLUS (SEPSIS)
1000.0000 mL | Freq: Once | INTRAVENOUS | Status: AC
  Administered 2014-11-14: 1000 mL via INTRAVENOUS

## 2014-11-14 MED ORDER — COSYNTROPIN 0.25 MG IJ SOLR
0.2500 mg | Freq: Once | INTRAMUSCULAR | Status: AC
  Administered 2014-11-15: 0.25 mg via INTRAVENOUS
  Filled 2014-11-14 (×2): qty 0.25

## 2014-11-14 MED ORDER — ZOLPIDEM TARTRATE 5 MG PO TABS: 5.0000 mg | ORAL_TABLET | Freq: Once | ORAL | Status: DC

## 2014-11-14 MED ORDER — SACCHAROMYCES BOULARDII 250 MG PO CAPS
250.0000 mg | ORAL_CAPSULE | Freq: Two times a day (BID) | ORAL | Status: DC
Start: 2014-11-14 — End: 2014-11-22
  Administered 2014-11-14 – 2014-11-22 (×15): 250 mg via ORAL
  Filled 2014-11-14 (×15): qty 1

## 2014-11-14 NOTE — Progress Notes (Signed)
[facility] Progress Note    Patient: [name]  [id number]  DOB: [birth year]  DOA: 11/09/2014 Date of Service: the patient was seen and examined on 11/14/2014  Subjective: Continues to have some tiredness. Did not have any lose watery bowel movement yesterday again. Blood pressure this morning drop again. Patient denies having any dizziness or lightheadedness.  Nutrition: Appetite has been good.  Brief Summary of Hospitalization: Day 5 of admission Patient admitted on 11/09/2014 for nonhealing left foot ulcer, patient was started on vancomycin and Fortaz, orthopedic was consulted and currently we're following, MRI shows anaerobic cellulitis with probable subcutaneous emphysema. Orthopedic recommends plastic surgery consult, plastic surgery with attempted debridement but requested vascular surgery consultation. Patient was transferred to step down unit because of persistent hypotension not responding to IV fluids.  Assessment and Plan 1. Diabetic foot infection ([place])  Hypotension  Cannot rule out sepsis  Currently on vancomycin and Fortaz MRI shows no evidence of osteomyelitis and shows anaerobic cellulitis. X-ray shows possible subcutaneous emphysema. Orthopedic recommended to consult plastic surgery for the patient. Plastic surgery Dr. Iran Planas planning perform wound debridement with possible wound VAC placement on Tuesday 11/16/2014. Vascular surgery has evaluated the patient today and will perform angiogram on 11/15/2014 and recommended hydration over weekend.  Phone discussion with ID recommends narrow down the antibiotics to Unasyn to cover for gram-negative rods as well as gram-positive cocci without Staphylococcus. Once the patient is more hemodynamically stable will start narrowing down the antibiotics   2. Hypoension. Persistent early morning hypertension. We will check echocardiogram as well as cosyntropin stim admission taste. Lactic acid is negative  multiple times. No leukocytosis. Sepsis less likely. Continue IV hydration since the patient is coming to IV fluids.  antihypertensive medication has been discontinued Lasix has been discontinued. We'll continue close monitoring in stepdown unit.  3.human immunodeficiency virus (HIV) disease ([place]) Currently continuing patient's home HIV medication CD4 count 09/29/2014 29%, 570 absolute  4  Diabetes mellitus type 2 with complications, uncontrolled ([place]) Diabetic nephropathy Diabetic neuropathy Continue resistant sliding scale, Patient is currently on home Lantus Hemoglobin A1c 8.4 07/08/2014 Recent hemoglobin A1c 10 showing poor control    5 chronic kidney disease  Pharmacy is consulted for management of antibiotic for renal dosing. Renal function improving with hydration.  discontinuing antihypertensive medication as well as TriCor  Avoid nephrotoxic medications  6  Dyslipidemia Continuing statin  7  Mood disorder ([name]) Continuing home medications including Abilify, Lexapro,  Due to persistent hypotension Klonopin changed to when necessary 3 times a day   8  Diastolic dysfunction Currently holding Lasix due to hypotension monitor ins and outs.   9 expiratory wheezing, Probable bronchospasm. currently resolved, Continue DuoNeb   10 Anemia patient's H&H remained stable. Most likely anemia of chronic disease with low iron and low TIBC, ferritin. Daily iron supplementation.  DVT Prophylaxis: subcutaneous Heparin Nutrition: Diabetic diet  Advance goals of care discussion: Full code  Disposition:  Discharge pending workup regarding angiogram as well as debridement. PTOT will be consulted post procedure.  Consultants: Orthopedics Plastic surgery  Vascular surgery  Antibiotics: Vancomycin and  Zosyn start date 11/09/2014   Intake/Output Summary (Last 24 hours) at 11/14/14 1515 Last data filed at 11/14/14 1000  Gross per 24 hour  Intake   2200 ml  Output   1876  ml  Net    324 ml   Filed Weights   11/10/14 0200 11/13/14 0800 11/14/14 0500  Weight: 110.2 kg (242 lb 15.2 oz) 109.1 kg (  240 lb 8.4 oz) 111 kg (244 lb 11.4 oz)    Objective: Physical Exam: Filed Vitals:   11/14/14 0750 11/14/14 0800 11/14/14 1200 11/14/14 1400  BP: 101/59  102/49 150/73  Pulse: 56  70 78  Temp: 97.6 F (36.4 C) 97.6 F (36.4 C)    TempSrc: Oral Oral    Resp: 16  17 16   Height:      Weight:      SpO2: 91%  95% 98%     General: Appear in no distress Cardiovascular: S1 and S2 Present, no Murmur, Peripheral Pulses present Respiratory: Bilateral Air entry present and  No wheezes Abdomen: Bowel Sound present, Soft and no tenderness Skin: Large open ulcer on the left foot with eschar, marked area improving.  Extremities: no Pedal edema, no calf tenderness  Data Reviewed: CBC:  Recent Labs Lab 11/11/14 0525 11/12/14 0547 11/13/14 0420 11/13/14 1157 11/14/14 0340  WBC 13.7* 13.0* 11.8* 11.3* 12.3*  NEUTROABS 10.7* 10.1* 8.9* 8.3* 9.2*  HGB 9.9* 9.1* 7.9* 8.5* 8.6*  HCT 31.9* 29.7* 25.9* 27.4* 27.6*  MCV 84.8 85.6 84.6 86.2 85.7  PLT 459* 489* 459* 459* 123456*   Basic Metabolic Panel:  Recent Labs Lab 11/11/14 0525 11/12/14 0547 11/12/14 2238 11/13/14 0420 11/14/14 0340  NA 134* 133* 134* 131* 133*  K 3.8 4.3 4.1 3.8 4.2  CL 95* 97* 101 99* 99*  CO2 29 27 26 26 26   GLUCOSE 183* 228* 321* 195* 220*  BUN 35* 34* 28* 27* 27*  CREATININE 2.30* 2.04* 1.73* 1.71* 1.71*  CALCIUM 9.4 9.1 8.7* 8.7* 9.0   Liver Function Tests:  Recent Labs Lab 11/10/14 0555 11/11/14 0525 11/12/14 0547 11/13/14 0420 11/14/14 0340  AST 13* 12* 11* 10* 11*  ALT 11* 9* 9* 9* 7*  ALKPHOS 102 92 79 80 72  BILITOT 0.6 0.5 0.4 0.6 0.4  PROT 7.8 7.6 6.9 6.5 6.9  ALBUMIN 2.6* 2.5* 2.3* 2.2* 2.4*   No results for input(s): LIPASE, AMYLASE in the last 168 hours. No results for input(s): AMMONIA in the last 168 hours.  Cardiac Enzymes:  Recent Labs Lab  11/12/14 0547  CKTOTAL 32*   BNP (last 3 results)  Recent Labs  11/12/14 0547  BNP 39.0    ProBNP (last 3 results) No results for input(s): PROBNP in the last 8760 hours.  CBG:  Recent Labs Lab 11/13/14 0728 11/13/14 1203 11/13/14 1616 11/14/14 0005 11/14/14 0757  GLUCAP 153* 173* 170* 277* 196*    Recent Results (from the past 240 hour(s))  MRSA PCR Screening     Status: None   Collection Time: 11/10/14  2:22 AM  Result Value Ref Range Status   MRSA by PCR NEGATIVE NEGATIVE Final    Comment:        The GeneXpert MRSA Assay (FDA approved for NASAL specimens only), is one component of a comprehensive MRSA colonization surveillance program. It is not intended to diagnose MRSA infection nor to guide or monitor treatment for MRSA infections.   Wound culture     Status: None   Collection Time: 11/10/14  2:22 AM  Result Value Ref Range Status   Specimen Description WOUND L FOOT  Final   Special Requests NONE  Final   Gram Stain   Final    NO WBC SEEN NO SQUAMOUS EPITHELIAL CELLS SEEN ABUNDANT GRAM POSITIVE COCCI IN PAIRS IN CLUSTERS ABUNDANT GRAM NEGATIVE RODS Performed at [facility]    Culture   Final  MULTIPLE ORGANISMS PRESENT, NONE PREDOMINANT Note: NO STAPHYLOCOCCUS AUREUS ISOLATED NO GROUP A STREP (S.PYOGENES) ISOLATED Performed at [facility]    Report Status 11/12/2014 FINAL  Final  MRSA PCR Screening     Status: None   Collection Time: 11/12/14 12:24 PM  Result Value Ref Range Status   MRSA by PCR NEGATIVE NEGATIVE Final    Comment:        The GeneXpert MRSA Assay (FDA approved for NASAL specimens only), is one component of a comprehensive MRSA colonization surveillance program. It is not intended to diagnose MRSA infection nor to guide or monitor treatment for MRSA infections.      Studies: No results found.  Scheduled Meds: . ARIPiprazole  10 mg Oral Daily  . aspirin  81 mg Oral Daily  .  budesonide-formoterol  2 puff Inhalation BID  . [START ON 11/15/2014] cosyntropin  0.25 mg Intravenous Once  . emtricitabine-tenofovir AF  1 tablet Oral Q breakfast   And  . rilpivirine  25 mg Oral Q breakfast  . escitalopram  20 mg Oral Daily  . feeding supplement (GLUCERNA SHAKE)  237 mL Oral TID BM  . heparin subcutaneous  5,000 Units Subcutaneous 3 times per day  . insulin aspart  0-20 Units Subcutaneous TID WC  . insulin aspart  0-5 Units Subcutaneous QHS  . insulin glargine  70 Units Subcutaneous Daily  . ipratropium-albuterol  3 mL Nebulization BID  . methylphenidate  20 mg Oral Daily  . piperacillin-tazobactam (ZOSYN)  IV  3.375 g Intravenous Q8H  . pregabalin  150 mg Oral BID  . rosuvastatin  20 mg Oral Daily  . sodium chloride  500 mL Intravenous Once  . vancomycin  1,000 mg Intravenous Q24H   Continuous Infusions: . sodium chloride 100 mL/hr at 11/14/14 1000   Time spent: 20 minutes  Author: Berle Mull, MD [facility] Pager: [phone] 11/14/2014 3:15 PM  If 7PM-7AM, please contact night-coverage at [email or web address], password [facility]

## 2014-11-14 NOTE — Plan of Care (Signed)
Problem: Bowel/Gastric: Goal: Will not experience complications related to bowel motility Outcome: Progressing Patient complains of diarrhea

## 2014-11-14 NOTE — Progress Notes (Signed)
Patient reported symptom

## 2014-11-15 ENCOUNTER — Encounter (HOSPITAL_COMMUNITY)
Admission: EM | Disposition: A | Discharge status: Rehabilitation Facility | Payer: Self-pay | Source: Home / Self Care | Attending: Internal Medicine

## 2014-11-15 ENCOUNTER — Other Ambulatory Visit (HOSPITAL_COMMUNITY): Payer: Medicare HMO

## 2014-11-15 ENCOUNTER — Ambulatory Visit: Payer: Medicare HMO | Admitting: Internal Medicine

## 2014-11-15 HISTORY — PX: PERIPHERAL VASCULAR CATHETERIZATION: SHX172C

## 2014-11-15 LAB — CBC WITH DIFFERENTIAL/PLATELET
BASOS ABS: 0 10*3/uL (ref 0.0–0.1)
BASOS PCT: 0 %
EOS ABS: 0.4 10*3/uL (ref 0.0–0.7)
Eosinophils Relative: 3 %
HEMATOCRIT: 26.2 % — AB (ref 36.0–46.0)
HEMOGLOBIN: 8.1 g/dL — AB (ref 12.0–15.0)
Lymphocytes Relative: 17 %
Lymphs Abs: 2.2 10*3/uL (ref 0.7–4.0)
MCH: 26.1 pg (ref 26.0–34.0)
MCHC: 30.9 g/dL (ref 30.0–36.0)
MCV: 84.5 fL (ref 78.0–100.0)
MONOS PCT: 5 %
Monocytes Absolute: 0.7 10*3/uL (ref 0.1–1.0)
NEUTROS ABS: 9.6 10*3/uL — AB (ref 1.7–7.7)
NEUTROS PCT: 75 %
Platelets: 582 10*3/uL — ABNORMAL HIGH (ref 150–400)
RBC: 3.1 MIL/uL — ABNORMAL LOW (ref 3.87–5.11)
RDW: 17.4 % — AB (ref 11.5–15.5)
WBC: 12.9 10*3/uL — ABNORMAL HIGH (ref 4.0–10.5)

## 2014-11-15 LAB — PROTIME-INR
INR: 1.18 (ref 0.00–1.49)
Prothrombin Time: 15.2 seconds (ref 11.6–15.2)

## 2014-11-15 LAB — POCT ACTIVATED CLOTTING TIME
ACTIVATED CLOTTING TIME: 177 s
Activated Clotting Time: 251 seconds

## 2014-11-15 LAB — GLUCOSE, CAPILLARY
GLUCOSE-CAPILLARY: 252 mg/dL — AB (ref 65–99)
GLUCOSE-CAPILLARY: 117 mg/dL — AB (ref 65–99)
GLUCOSE-CAPILLARY: 168 mg/dL — AB (ref 65–99)
Glucose-Capillary: 111 mg/dL — ABNORMAL HIGH (ref 65–99)
Glucose-Capillary: 146 mg/dL — ABNORMAL HIGH (ref 65–99)
Glucose-Capillary: 151 mg/dL — ABNORMAL HIGH (ref 65–99)
Glucose-Capillary: 86 mg/dL (ref 65–99)

## 2014-11-15 LAB — COMPREHENSIVE METABOLIC PANEL
ALBUMIN: 2.4 g/dL — AB (ref 3.5–5.0)
ALK PHOS: 80 U/L (ref 38–126)
ALT: 9 U/L — ABNORMAL LOW (ref 14–54)
ANION GAP: 9 (ref 5–15)
AST: 12 U/L — AB (ref 15–41)
BUN: 27 mg/dL — AB (ref 6–20)
CO2: 28 mmol/L (ref 22–32)
Calcium: 9.1 mg/dL (ref 8.9–10.3)
Chloride: 100 mmol/L — ABNORMAL LOW (ref 101–111)
Creatinine, Ser: 1.74 mg/dL — ABNORMAL HIGH (ref 0.44–1.00)
GFR calc Af Amer: 38 mL/min — ABNORMAL LOW (ref >60)
GFR calc non Af Amer: 33 mL/min — ABNORMAL LOW (ref >60)
GLUCOSE: 125 mg/dL — AB (ref 65–99)
POTASSIUM: 4.2 mmol/L (ref 3.5–5.1)
SODIUM: 137 mmol/L (ref 135–145)
Total Bilirubin: 0.8 mg/dL (ref 0.3–1.2)
Total Protein: 7.1 g/dL (ref 6.5–8.1)

## 2014-11-15 LAB — CBC
HCT: 28.5 % — ABNORMAL LOW (ref 36.0–46.0)
Hemoglobin: 9.1 g/dL — ABNORMAL LOW (ref 12.0–15.0)
MCH: 26.4 pg (ref 26.0–34.0)
MCHC: 31.9 g/dL (ref 30.0–36.0)
MCV: 82.6 fL (ref 78.0–100.0)
PLATELETS: 536 10*3/uL — AB (ref 150–400)
RBC: 3.45 MIL/uL — AB (ref 3.87–5.11)
RDW: 17.5 % — ABNORMAL HIGH (ref 11.5–15.5)
WBC: 10.8 10*3/uL — ABNORMAL HIGH (ref 4.0–10.5)

## 2014-11-15 LAB — CREATININE, SERUM
CREATININE: 1.69 mg/dL — AB (ref 0.44–1.00)
GFR, EST AFRICAN AMERICAN: 40 mL/min — AB (ref >60)
GFR, EST NON AFRICAN AMERICAN: 34 mL/min — AB (ref >60)

## 2014-11-15 LAB — ACTH STIMULATION, 3 TIME POINTS
CORTISOL 30 MIN: 23.7 ug/dL
CORTISOL 60 MIN: 24.8 ug/dL
CORTISOL BASE: 5.6 ug/dL

## 2014-11-15 SURGERY — ABDOMINAL AORTOGRAM

## 2014-11-15 MED ORDER — HEPARIN (PORCINE) IN NACL 2-0.9 UNIT/ML-% IJ SOLN
INTRAMUSCULAR | Status: AC
  Filled 2014-11-15: qty 1000

## 2014-11-15 MED ORDER — SODIUM CHLORIDE 0.9 % IV SOLN: 1.0000 mL/kg/h | INTRAVENOUS | Status: AC

## 2014-11-15 MED ORDER — INSULIN GLARGINE 100 UNIT/ML ~~LOC~~ SOLN
35.0000 [IU] | Freq: Once | SUBCUTANEOUS | Status: AC
  Administered 2014-11-15: 35 [IU] via SUBCUTANEOUS

## 2014-11-15 MED ORDER — SODIUM CHLORIDE 0.9 % IV SOLN
Freq: Once | INTRAVENOUS | Status: AC
  Administered 2014-11-15: 1000 mL via INTRAVENOUS

## 2014-11-15 MED ORDER — ACETAMINOPHEN 325 MG PO TABS: 650.0000 mg | ORAL_TABLET | ORAL | Status: DC | PRN

## 2014-11-15 MED ORDER — HEPARIN SODIUM (PORCINE) 1000 UNIT/ML IJ SOLN
INTRAMUSCULAR | Status: AC
  Filled 2014-11-15: qty 1

## 2014-11-15 MED ORDER — METHYLPHENIDATE HCL 5 MG PO TABS
20.0000 mg | ORAL_TABLET | Freq: Every day | ORAL | Status: DC
  Administered 2014-11-15 – 2014-11-22 (×7): 20 mg via ORAL
  Filled 2014-11-15 (×7): qty 4

## 2014-11-15 MED ORDER — ENOXAPARIN SODIUM 30 MG/0.3ML ~~LOC~~ SOLN: 30.0000 mg | SUBCUTANEOUS | Status: DC

## 2014-11-15 MED ORDER — FENTANYL CITRATE (PF) 100 MCG/2ML IJ SOLN
INTRAMUSCULAR | Status: AC
  Filled 2014-11-15: qty 4

## 2014-11-15 MED ORDER — HEPARIN SODIUM (PORCINE) 1000 UNIT/ML IJ SOLN
INTRAMUSCULAR | Status: DC | PRN
  Administered 2014-11-15: 12000 [IU] via INTRAVENOUS

## 2014-11-15 MED ORDER — MIDAZOLAM HCL 2 MG/2ML IJ SOLN
INTRAMUSCULAR | Status: DC | PRN
  Administered 2014-11-15: 0.5 mg via INTRAVENOUS

## 2014-11-15 MED ORDER — OXYCODONE-ACETAMINOPHEN 5-325 MG PO TABS
1.0000 | ORAL_TABLET | ORAL | Status: DC | PRN
  Filled 2014-11-15: qty 2

## 2014-11-15 MED ORDER — CLOPIDOGREL BISULFATE 75 MG PO TABS
300.0000 mg | ORAL_TABLET | Freq: Once | ORAL | Status: AC
  Administered 2014-11-15: 300 mg via ORAL
  Filled 2014-11-15: qty 4

## 2014-11-15 MED ORDER — CLOPIDOGREL BISULFATE 75 MG PO TABS
75.0000 mg | ORAL_TABLET | Freq: Every day | ORAL | Status: DC
  Administered 2014-11-16 – 2014-11-17 (×2): 75 mg via ORAL
  Filled 2014-11-15 (×2): qty 1

## 2014-11-15 MED ORDER — LIDOCAINE HCL (PF) 1 % IJ SOLN
INTRAMUSCULAR | Status: AC
  Filled 2014-11-15: qty 30

## 2014-11-15 MED ORDER — MORPHINE SULFATE (PF) 2 MG/ML IV SOLN
2.0000 mg | INTRAVENOUS | Status: DC | PRN
  Administered 2014-11-17 – 2014-11-18 (×4): 2 mg via INTRAVENOUS
  Filled 2014-11-15 (×4): qty 1

## 2014-11-15 MED ORDER — INSULIN ASPART 100 UNIT/ML ~~LOC~~ SOLN
0.0000 [IU] | Freq: Four times a day (QID) | SUBCUTANEOUS | Status: DC
  Administered 2014-11-15: 3 [IU] via SUBCUTANEOUS
  Administered 2014-11-16: 2 [IU] via SUBCUTANEOUS

## 2014-11-15 MED ORDER — IPRATROPIUM-ALBUTEROL 0.5-2.5 (3) MG/3ML IN SOLN: 3.0000 mL | RESPIRATORY_TRACT | Status: DC | PRN

## 2014-11-15 MED ORDER — FENTANYL CITRATE (PF) 100 MCG/2ML IJ SOLN
INTRAMUSCULAR | Status: DC | PRN
  Administered 2014-11-15 (×2): 25 ug via INTRAVENOUS

## 2014-11-15 MED ORDER — LIDOCAINE HCL (PF) 1 % IJ SOLN
INTRAMUSCULAR | Status: DC | PRN
  Administered 2014-11-15: 12 mL

## 2014-11-15 MED ORDER — ONDANSETRON HCL 4 MG/2ML IJ SOLN: 4.0000 mg | Freq: Four times a day (QID) | INTRAMUSCULAR | Status: DC | PRN

## 2014-11-15 MED ORDER — MIDAZOLAM HCL 2 MG/2ML IJ SOLN
INTRAMUSCULAR | Status: AC
  Filled 2014-11-15: qty 4

## 2014-11-15 SURGICAL SUPPLY — 24 items
BALLOON SABER 5.0X150X150 (BALLOONS) ×5 IMPLANT
CATH OMNI FLUSH 5F 65CM (CATHETERS) ×5 IMPLANT
CATH QUICKCROSS .035X135CM (MICROCATHETER) ×5 IMPLANT
COVER PRB 48X5XTLSCP FOLD TPE (BAG) ×3 IMPLANT
COVER PROBE 5X48 (BAG) ×2
DEVICE CONTINUOUS FLUSH (MISCELLANEOUS) ×5 IMPLANT
FILTER CO2 0.2 MICRON (VASCULAR PRODUCTS) ×5 IMPLANT
GUIDEWIRE ANGLED .035X260CM (WIRE) ×5 IMPLANT
KIT ENCORE 26 ADVANTAGE (KITS) ×5 IMPLANT
KIT MICROINTRODUCER STIFF 5F (SHEATH) ×5 IMPLANT
KIT PV (KITS) ×5 IMPLANT
RESERVOIR CO2 (VASCULAR PRODUCTS) ×5 IMPLANT
SET FLUSH CO2 (MISCELLANEOUS) ×5 IMPLANT
SHEATH PINNACLE 5F 10CM (SHEATH) ×5 IMPLANT
SHEATH PINNACLE ST 6F 45CM (SHEATH) ×5 IMPLANT
STENT VIABAHN 5X150X120 (Permanent Stent) ×5 IMPLANT
SYR MEDRAD MARK V 150ML (SYRINGE) ×5 IMPLANT
TAPE RADIOPAQUE TURBO (MISCELLANEOUS) ×5 IMPLANT
TRANSDUCER W/STOPCOCK (MISCELLANEOUS) ×5 IMPLANT
TRAY PV CATH (CUSTOM PROCEDURE TRAY) ×5 IMPLANT
WIRE BENTSON .035X145CM (WIRE) ×5 IMPLANT
WIRE HI TORQ VERSACORE J 260CM (WIRE) ×5 IMPLANT
WIRE ROSEN-J .035X180CM (WIRE) ×5 IMPLANT
WIRE SPARTACORE .014X300CM (WIRE) ×5 IMPLANT

## 2014-11-15 NOTE — Progress Notes (Signed)
Inpatient Diabetes Program Recommendations  AACE/ADA: New Consensus Statement on Inpatient Glycemic Control (2015)  Target Ranges:  Prepandial:   less than 140 mg/dL      Peak postprandial:   less than 180 mg/dL (1-2 hours)      Critically ill patients:  140 - 180 mg/dL   Call received from RN regarding patient's Lantus dose of 70 units which is due this morning.  She states that patient is NPO for a procedure this morning. She is being transferred to [facility].   Recommend 1/2 of patient's Lantus this morning prior to procedure.  Instructed RN to call MD, Dr. Posey Pronto to get orders for insulin.    Thanks,  Adah Perl, RN, BC-ADM Inpatient Diabetes Coordinator Pager [phone] (8a-5p)

## 2014-11-15 NOTE — Progress Notes (Signed)
Site area: RFA Site Prior to Removal:  Level 0 Pressure Applied For:70min Manual:   yes Patient Status During Pull:  stable Post Pull Site:  Level 0 Post Pull Instructions Given:  yes Post Pull Pulses Present: palpable Dressing Applied:  clear Bedrest begins @ 1620 till 2020 Comments:

## 2014-11-15 NOTE — Progress Notes (Signed)
[facility] Progress Note    Patient: [name]  [id number]  DOB: Nov 15, 1965  DOA: 11/09/2014 Date of Service: the patient was seen and examined on 11/15/2014  Subjective: Blood pressure has been stable this morning. Denies having any fatigue chest pain and abdominal pain. Did not have any loose bowel movement yesterday. Nutrition: Appetite has been good.  Brief Summary of Hospitalization: Day 6 of admission Patient admitted on 11/09/2014 for nonhealing left foot ulcer which has been ongoing for 2 weeks prior to arrival, patient was started on vancomycin and Fortaz, orthopedic was consulted and her MRI shows anaerobic cellulitis with probable subcutaneous emphysema. Orthopedic attending offered amputation but the patient preferred to explore other options. Therefore orthopedic recommended plastic surgery consultation. plastic surgery will attempt debridement but requested vascular surgery consultation. Throughout the hospitalization the patient had 3 episodes of low blood pressure earlier in the morning responding to IV fluids of unclear etiology. Patient was transferred to step down unit because of persistent hypotension. Patient's blood pressure medications were on hold and Lasix was also discontinued. Vascular surgery has scheduled the patient on 11/15/2014 for aortogram. Plastic surgery has tentatively scheduled the patient on 11/16/2014 for muscle debridement and wound VAC placement, but with the change in patient's placement date of the procedure can change. Patient's blood pressure has been significantly stable.  C. difficile has been negative.  Assessment and Plan 1. Diabetic foot infection ([place])  Hypotension  Cannot rule out sepsis  Currently on vancomycin and Fortaz MRI shows no evidence of osteomyelitis and shows anaerobic cellulitis. X-ray shows possible subcutaneous emphysema. Orthopedic recommended to consult plastic surgery for the patient. Plastic surgery  Dr. Iran Planas planning perform wound debridement with possible wound VAC placement on Tuesday 11/16/2014. Vascular surgery has evaluated the patient today and will perform angiogram on 11/15/2014. Patient will be transferred to [facility] for the vascular surgery procedure, patient will remain in [facility] complaining her hospital course.  Phone discussion with ID recommends narrow down the antibiotics to Unasyn to cover for gram-negative rods as well as gram-positive cocci without Staphylococcus, as per patient's wound Gram stain. Once the patient is more hemodynamically stable will start narrowing down the antibiotics   2. Hypoension. Persistent early morning hypertension. We will check echocardiogram cosyntropin stimulation test negative. Lactic acid is negative multiple times. No leukocytosis. Sepsis less likely. antihypertensive medication has been discontinued Lasix has been discontinued. Continue IV hydration since the patient is improving with her IV fluids. We'll continue close monitoring in stepdown unit  3.human immunodeficiency virus (HIV) disease ([place]) Currently continuing patient's home HIV medication CD4 count 09/29/2014 29%, 570 absolute  4  Diabetes mellitus type 2 with complications, uncontrolled ([place]) Diabetic nephropathy Diabetic neuropathy Hemoglobin A1c 8.4 07/08/2014 Recent hemoglobin A1c 10 showing poor control   Giving her 35 units of Lantus today and resuming 70 units of home Lantus starting tomorrow. Changing the sliding scale from resistant to moderate and every 6 hours since the patient will remain nothing by mouth  5 chronic kidney disease  Pharmacy is consulted for management of antibiotic for renal dosing. Renal function improving with hydration.  discontinuing antihypertensive medication as well as [facility]  patient was informed about potential risk of worsening renal function after angiogram due to her chronic kidney disease,  secondary to contrast-induced nephropathy. while the patient will be transferred to [facility] she may require nephrology consultation postprocedure.  6  Dyslipidemia Continuing statin  7  Mood disorder ([name]) Continuing home medications  including Abilify, Lexapro,  Due to persistent hypotension Klonopin changed to when necessary 3 times a day   8  Diastolic dysfunction Currently holding Lasix due to hypotension monitor ins and outs.   9 expiratory wheezing, Probable bronchospasm. currently resolved, Continue DuoNeb   10 Anemia patient's H&H remained stable. Most likely anemia of chronic disease with low iron and low TIBC, ferritin. Daily iron supplementation.  DVT Prophylaxis: subcutaneous Heparin Nutrition: Diabetic diet  Advance goals of care discussion: Full code  Disposition:  Discharge pending workup regarding angiogram as well as debridement. PTOT will be consulted post procedure.  Consultants: Orthopedics Plastic surgery  Vascular surgery  Antibiotics: Vancomycin and  Zosyn start date 11/09/2014   Intake/Output Summary (Last 24 hours) at 11/15/14 1152 Last data filed at 11/15/14 1100  Gross per 24 hour  Intake   3040 ml  Output   4900 ml  Net  -1860 ml   Filed Weights   11/13/14 0800 11/14/14 0500 11/15/14 0315  Weight: 109.1 kg (240 lb 8.4 oz) 111 kg (244 lb 11.4 oz) 112.5 kg (248 lb 0.3 oz)    Objective: Physical Exam: Filed Vitals:   11/15/14 0800 11/15/14 0900 11/15/14 1000 11/15/14 1100  BP: 111/56     Pulse: 73 72 75 68  Temp: 99 F (37.2 C)     TempSrc: Oral     Resp: 12 14 12 16   Height:      Weight:      SpO2: 93% 96% 96% 93%    General: Appear in no distress Cardiovascular: S1 and S2 Present, no Murmur, Peripheral Pulses present Respiratory: Bilateral Air entry present and  No wheezes Abdomen: Bowel Sound present, Soft and no tenderness Skin: Large open ulcer on the left foot with eschar, marked area stable.  Extremities:  no Pedal edema, no calf tenderness  Data Reviewed: CBC:  Recent Labs Lab 11/12/14 0547 11/13/14 0420 11/13/14 1157 11/14/14 0340 11/15/14 0350  WBC 13.0* 11.8* 11.3* 12.3* 12.9*  NEUTROABS 10.1* 8.9* 8.3* 9.2* 9.6*  HGB 9.1* 7.9* 8.5* 8.6* 8.1*  HCT 29.7* 25.9* 27.4* 27.6* 26.2*  MCV 85.6 84.6 86.2 85.7 84.5  PLT 489* 459* 459* 478* 0000000*   Basic Metabolic Panel:  Recent Labs Lab 11/12/14 0547 11/12/14 2238 11/13/14 0420 11/14/14 0340 11/15/14 0350  NA 133* 134* 131* 133* 137  K 4.3 4.1 3.8 4.2 4.2  CL 97* 101 99* 99* 100*  CO2 27 26 26 26 28   GLUCOSE 228* 321* 195* 220* 125*  BUN 34* 28* 27* 27* 27*  CREATININE 2.04* 1.73* 1.71* 1.71* 1.74*  CALCIUM 9.1 8.7* 8.7* 9.0 9.1   Liver Function Tests:  Recent Labs Lab 11/11/14 0525 11/12/14 0547 11/13/14 0420 11/14/14 0340 11/15/14 0350  AST 12* 11* 10* 11* 12*  ALT 9* 9* 9* 7* 9*  ALKPHOS 92 79 80 72 80  BILITOT 0.5 0.4 0.6 0.4 0.8  PROT 7.6 6.9 6.5 6.9 7.1  ALBUMIN 2.5* 2.3* 2.2* 2.4* 2.4*   No results for input(s): LIPASE, AMYLASE in the last 168 hours. No results for input(s): AMMONIA in the last 168 hours.  Cardiac Enzymes:  Recent Labs Lab 11/12/14 0547  CKTOTAL 32*   BNP (last 3 results)  Recent Labs  11/12/14 0547  BNP 39.0    ProBNP (last 3 results) No results for input(s): PROBNP in the last 8760 hours.  CBG:  Recent Labs Lab 11/14/14 0757 11/14/14 1148 11/14/14 1751 11/14/14 2148 11/15/14 0733  GLUCAP 196* 223* 324* 252*  117*    Recent Results (from the past 240 hour(s))  MRSA PCR Screening     Status: None   Collection Time: 11/10/14  2:22 AM  Result Value Ref Range Status   MRSA by PCR NEGATIVE NEGATIVE Final    Comment:        The GeneXpert MRSA Assay (FDA approved for NASAL specimens only), is one component of a comprehensive MRSA colonization surveillance program. It is not intended to diagnose MRSA infection nor to guide or monitor treatment for MRSA  infections.   Wound culture     Status: None   Collection Time: 11/10/14  2:22 AM  Result Value Ref Range Status   Specimen Description WOUND L FOOT  Final   Special Requests NONE  Final   Gram Stain   Final    NO WBC SEEN NO SQUAMOUS EPITHELIAL CELLS SEEN ABUNDANT GRAM POSITIVE COCCI IN PAIRS IN CLUSTERS ABUNDANT GRAM NEGATIVE RODS Performed at [facility]    Culture   Final    MULTIPLE ORGANISMS PRESENT, NONE PREDOMINANT Note: NO STAPHYLOCOCCUS AUREUS ISOLATED NO GROUP A STREP (S.PYOGENES) ISOLATED Performed at [facility]    Report Status 11/12/2014 FINAL  Final  MRSA PCR Screening     Status: None   Collection Time: 11/12/14 12:24 PM  Result Value Ref Range Status   MRSA by PCR NEGATIVE NEGATIVE Final    Comment:        The GeneXpert MRSA Assay (FDA approved for NASAL specimens only), is one component of a comprehensive MRSA colonization surveillance program. It is not intended to diagnose MRSA infection nor to guide or monitor treatment for MRSA infections.   C difficile quick scan w PCR reflex     Status: None   Collection Time: 11/14/14  2:00 PM  Result Value Ref Range Status   C Diff antigen NEGATIVE NEGATIVE Final   C Diff toxin NEGATIVE NEGATIVE Final   C Diff interpretation Negative for toxigenic C. difficile  Final     Studies: No results found.  Scheduled Meds: . ARIPiprazole  10 mg Oral Daily  . aspirin  81 mg Oral Daily  . budesonide-formoterol  2 puff Inhalation BID  . emtricitabine-tenofovir AF  1 tablet Oral Q breakfast   And  . rilpivirine  25 mg Oral Q breakfast  . escitalopram  20 mg Oral Daily  . feeding supplement (GLUCERNA SHAKE)  237 mL Oral TID BM  . heparin subcutaneous  5,000 Units Subcutaneous 3 times per day  . insulin aspart  0-20 Units Subcutaneous TID WC  . insulin aspart  0-5 Units Subcutaneous QHS  . insulin glargine  70 Units Subcutaneous Daily  . methylphenidate  20 mg Oral Daily  .  piperacillin-tazobactam (ZOSYN)  IV  3.375 g Intravenous Q8H  . pregabalin  150 mg Oral BID  . rosuvastatin  20 mg Oral Daily  . saccharomyces boulardii  250 mg Oral BID  . sodium chloride  500 mL Intravenous Once  . vancomycin  1,000 mg Intravenous Q24H   Continuous Infusions: . sodium chloride 100 mL/hr at 11/15/14 [id number]   Time spent: 20 minutes  Author: Berle Mull, MD [facility] Pager: [phone] 11/15/2014 11:52 AM  If 7PM-7AM, please contact night-coverage at [email or web address], password [facility]

## 2014-11-15 NOTE — H&P (View-Only) (Signed)
Referred by:  [facility]  Reason for referral: left foot perfusion  History of Present Illness  [name] is a 49 y.o. (1965/09/22) female who presents with chief complaint: large dorsal wound on left foot. The patient notes having some wounds in Left foot for nearly a year.  Initially she had an ulcer on her left heel.  She believes this ulcer has healed.  She had blistering of the dorsum of her left foot, which over the last 2 months progressed.  She notes relatively little pain in the left notes very limited sensation in the left foot.  She denies any drainage but she's not certain as her mother has been doing the wound care.  The patient denies any prior intermittent claudication or rest pain symptoms.  Her ambulation has been limited by his recovery from a hip fx.  Atherosclerotic risk factors include: DM, history of smoking until recently.  Past Medical History  Diagnosis Date  . Anxiety   . Depression   . Hypertension   . HIV infection ([place]) 2000  . Asthma     HOSPITALIZED WITH EXCERBATION OF ASTHMA - AND BRONCHITIS AND THE FLU DEC 2013  . Shortness of breath     ALLERGIES ARE "ACTING UP"  . Diabetes mellitus     ON INSULIN AND ORAL MEDICATIONS  . GERD (gastroesophageal reflux disease)   . Neuropathy     NEUROPATHY HANDS AND FEET  . Arthritis     LEFT HIP  . Pain     SEVERE PAIN LEFT HIP - HX OF LEFT HIP PINNING JULY 2013    Past Surgical History  Procedure Laterality Date  . Hip pinning,cannulated  07/16/2011    Procedure: CANNULATED HIP PINNING;  Surgeon: Gearlean Alf, MD;  Location: [facility];  Service: Orthopedics;  Laterality: Left;  . Hip pinning,cannulated Left 04/09/2012    Procedure: CANNULATED HIP PINNING AND HARDWARE REVISION;  Surgeon: Gearlean Alf, MD;  Location: [facility];  Service: Orthopedics;  Laterality: Left;  . Hip arthroplasty Left 05/05/2014    Procedure: ARTHROPLASTY OPEN REDUCTION INTERNAL FIXATION LEFT HIP AND REMOVAL OF TWO  CANNULATED SCREW;  Surgeon: Latanya Maudlin, MD;  Location: [facility];  Service: Orthopedics;  Laterality: Left;  . Cast application Left [id number]    Procedure: CAST APPLICATION (FIBERGLASS);  Surgeon: Latanya Maudlin, MD;  Location: [facility];  Service: Orthopedics;  Laterality: Left;  CLOSED REDUCTION OF LEFT COLLES FRACTURE WITH SHORT ARM CAST  . Hardware removal Left 05/05/2014    Procedure: HARDWARE REMOVAL LEFT HIP;  Surgeon: Latanya Maudlin, MD;  Location: [facility];  Service: Orthopedics;  Laterality: Left;  REMOVAL BIOMET 6.5-8.0 CANNULATED SCREW    Social History   Social History  . Marital Status: Legally Separated    Spouse Name: N/A  . Number of Children: N/A  . Years of Education: N/A   Occupational History  . Not on file.   Social History Main Topics  . Smoking status: Current Every Day Smoker -- 0.50 packs/day for 35 years    Types: E-cigarettes  . Smokeless tobacco: Never Used  . Alcohol Use: No     Comment: no drink since 2008  . Drug Use: No     Comment: hx of crack/cocaine, clean 8 years  . Sexual Activity: No   Other Topics Concern  . Not on file   Social History Narrative    Family History  Problem Relation Age of Onset  . Diabetes Mother   .  Hypertension Mother   . Vision loss Mother   . Heart disease Mother   . Hypertension Father   . Thyroid disease Neg Hx     Current Facility-Administered Medications  Medication Dose Route Frequency Provider Last Rate Last Dose  . 0.9 %  sodium chloride infusion   Intravenous Continuous Lavina Hamman, MD 100 mL/hr at 11/12/14 1500    . ARIPiprazole (ABILIFY) tablet 10 mg  10 mg Oral Daily Florencia Reasons, MD   10 mg at 11/12/14 1033  . budesonide-formoterol (SYMBICORT) 80-4.5 MCG/ACT inhaler 2 puff  2 puff Inhalation BID Florencia Reasons, MD   2 puff at 11/12/14 0900  . clonazePAM (KLONOPIN) tablet 0.5 mg  0.5 mg Oral TID PRN Lavina Hamman, MD      . cyclobenzaprine (FLEXERIL) tablet 10 mg  10 mg Oral BID PRN Florencia Reasons, MD   10 mg at  11/09/14 1943  . emtricitabine-tenofovir AF (DESCOVY) 200-25 MG per tablet 1 tablet  1 tablet Oral Q breakfast Florencia Reasons, MD   1 tablet at 11/12/14 0905   And  . rilpivirine (EDURANT) tablet 25 mg  25 mg Oral Q breakfast Florencia Reasons, MD   25 mg at 11/12/14 0905  . escitalopram (LEXAPRO) tablet 20 mg  20 mg Oral Daily Florencia Reasons, MD   20 mg at 11/12/14 1032  . feeding supplement (GLUCERNA SHAKE) (GLUCERNA SHAKE) liquid 237 mL  237 mL Oral TID BM Satira Anis Ward, RD   237 mL at 11/12/14 1400  . heparin injection 5,000 Units  5,000 Units Subcutaneous 3 times per day Lavina Hamman, MD   5,000 Units at 11/12/14 1436  . HYDROcodone-acetaminophen (NORCO) 10-325 MG per tablet 1 tablet  1 tablet Oral Q6H PRN Florencia Reasons, MD   1 tablet at 11/12/14 1320  . insulin aspart (novoLOG) injection 0-15 Units  0-15 Units Subcutaneous TID WC Gardiner Barefoot, NP   5 Units at 11/12/14 1223  . insulin aspart (novoLOG) injection 0-5 Units  0-5 Units Subcutaneous QHS Gardiner Barefoot, NP   2 Units at 11/11/14 2216  . insulin glargine (LANTUS) injection 70 Units  70 Units Subcutaneous Daily Florencia Reasons, MD   70 Units at 11/12/14 1033  . ipratropium-albuterol (DUONEB) 0.5-2.5 (3) MG/3ML nebulizer solution 3 mL  3 mL Nebulization Q6H Lavina Hamman, MD   3 mL at 11/12/14 1418  . methylphenidate (RITALIN) tablet 20 mg  20 mg Oral Daily Florencia Reasons, MD   20 mg at 11/12/14 1033  . piperacillin-tazobactam (ZOSYN) IVPB 3.375 g  3.375 g Intravenous Q8H Dorrene German, RPH   3.375 g at 11/12/14 1031  . pregabalin (LYRICA) capsule 150 mg  150 mg Oral BID Lavina Hamman, MD   150 mg at 11/12/14 1032  . rosuvastatin (CRESTOR) tablet 20 mg  20 mg Oral Daily Florencia Reasons, MD   20 mg at 11/12/14 1033  . sodium chloride 0.9 % bolus 500 mL  500 mL Intravenous Once Gardiner Barefoot, NP      . traMADol Veatrice Bourbon) tablet 100 mg  100 mg Oral Q6H PRN Florencia Reasons, MD   100 mg at 11/09/14 2208  . vancomycin (VANCOCIN) 1,250 mg in sodium chloride 0.9 % 250 mL IVPB   1,250 mg Intravenous Q24H Dorrene German, RPH   1,250 mg at 11/11/14 2120    Allergies  Allergen Reactions  . Cortizone-10 [Hydrocortisone] Rash    Per patient, given local injection at knee and developed  rash at local site.      REVIEW OF SYSTEMS:  (Positives checked otherwise negative)  CARDIOVASCULAR:   [ ]  chest pain,  [ ]  chest pressure,  [ ]  palpitations,  [ ]  shortness of breath when laying flat,  [ ]  shortness of breath with exertion,   [ ]  pain in feet when walking,  [ ]  pain in feet when laying flat, [ ]  history of blood clot in veins (DVT),  [ ]  history of phlebitis,  [ ]  swelling in legs,  [ ]  varicose veins  PULMONARY:   [ ]  productive cough,  [ ]  asthma,  [ ]  wheezing  NEUROLOGIC:   [ ]  weakness in arms or legs,  [ ]  numbness in arms or legs,  [ ]  difficulty speaking or slurred speech,  [ ]  temporary loss of vision in one eye,  [ ]  dizziness  HEMATOLOGIC:   [ ]  bleeding problems,  [ ]  problems with blood clotting too easily  MUSCULOSKEL:   [ ]  joint pain, [ ]  joint swelling  GASTROINTEST:   [ ]  vomiting blood,  [ ]  blood in stool     GENITOURINARY:   [ ]  burning with urination,  [ ]  blood in urine  PSYCHIATRIC:   [ ]  history of major depression  INTEGUMENTARY:   [ ]  rashes,  [x]  ulcers  CONSTITUTIONAL:   [ ]  fever,  [ ]  chills   For VQI Use Only  PRE-ADM LIVING: Home  AMB STATUS: Ambulatory  CAD Sx: None  PRIOR CHF: None  STRESS TEST: [x]  No, [ ]  Normal, [ ]  + ischemia, [ ]  + MI, [ ]  Both   Physical Examination  Filed Vitals:   11/12/14 1200 11/12/14 1205 11/12/14 1234 11/12/14 1346  BP:  118/53 97/38 139/48  Pulse:  94 86 79  Temp: 98.6 F (37 C)     TempSrc: Oral     Resp:  15 27 16   Height:      Weight:      SpO2:  97% 99% 99%   Body mass index is 34.86 kg/(m^2).  General: A&O x 3, WD, Obese,   Head: [place]/AT  Ear/Nose/Throat: Hearing grossly intact, nares w/o erythema or drainage, oropharynx w/o  Erythema/Exudate, Mallampati score: 3  Eyes: PERRLA, EOMI  Neck: Supple, no nuchal rigidity, no palpable LAD  Pulmonary: Sym exp, good air movt, CTAB, no rales, rhonchi, & wheezing  Cardiac: distal HS, RRR, Nl S1, S2, no Murmurs, rubs or gallops  Vascular: Vessel Right Left  Radial Not Palpable Not Palpable  Ulnar Not Palpable Not Palpable  Brachial Faintly Palpable Faintly Palpable  Carotid Palpable, without bruit Palpable, without bruit  Aorta Not palpable N/A  Femoral Palpable Palpable  Popliteal Faintly palpable Faintly palpable  PT Not Palpable Not Palpable  DP Faintly Palpable Faintly Palpable   Gastrointestinal: soft, NTND, -G/R, - HSM, - masses, - CVAT B  Musculoskeletal: M/S 5/5 throughout including DF/PF, Extremities without ischemic changes , extensive dorsal L foot eschar extending onto distal L leg, erythema extending up to mid-calf L leg, both feet warm  Neurologic: CN 2-12 intact , Pain and light touch intact in extremities except limited sensation from mid-calf down, Motor exam as listed above  Psychiatric: Judgment intact, Mood & affect appropriate for pt's clinical situation  Dermatologic: See M/S exam for extremity exam, no rashes otherwise noted  Lymph : No Cervical, Axillary, or Inguinal lymphadenopathy   Radiology: Dg Chest Port 1 View  11/12/2014  CLINICAL  DATA:  Shortness of Breath EXAM: PORTABLE CHEST 1 VIEW COMPARISON:  July 08, 2014 FINDINGS: There is no edema or consolidation. Heart is mildly enlarged with pulmonary vascularity within normal limits. No adenopathy. There are healed rib fractures bilaterally. IMPRESSION: No edema or consolidation.  Stable cardiac prominence. Electronically Signed   By: Lowella Grip III M.D.   On: 11/12/2014 08:11    Laboratory: CBC:    Component Value Date/Time   WBC 13.0* 11/12/2014 0547   RBC 3.47* 11/12/2014 0547   HGB 9.1* 11/12/2014 0547   HCT 29.7* 11/12/2014 0547   PLT 489* 11/12/2014 0547   MCV  85.6 11/12/2014 0547   MCH 26.2 11/12/2014 0547   MCHC 30.6 11/12/2014 0547   RDW 17.6* 11/12/2014 0547   LYMPHSABS 1.7 11/12/2014 0547   MONOABS 0.8 11/12/2014 0547   EOSABS 0.3 11/12/2014 0547   BASOSABS 0.0 11/12/2014 0547    BMP:    Component Value Date/Time   NA 133* 11/12/2014 0547   K 4.3 11/12/2014 0547   CL 97* 11/12/2014 0547   CO2 27 11/12/2014 0547   GLUCOSE 228* 11/12/2014 0547   BUN 34* 11/12/2014 0547   CREATININE 2.04* 11/12/2014 0547   CREATININE 1.81* 09/28/2014 1536   CALCIUM 9.1 11/12/2014 0547   GFRNONAA 27* 11/12/2014 0547   GFRNONAA 39* 12/09/2013 1533   GFRAA 32* 11/12/2014 0547   GFRAA 45* 12/09/2013 1533    Coagulation: Lab Results  Component Value Date   INR 1.27 11/10/2014   INR 1.11 07/08/2014   INR 1.04 04/07/2012   No results found for: PTT  Lipids:    Component Value Date/Time   CHOL 146 09/28/2014 1536   TRIG 130 09/28/2014 1536   HDL 45* 09/28/2014 1536   CHOLHDL 3.2 09/28/2014 1536   VLDL 26 09/28/2014 1536   LDLCALC 75 09/28/2014 1536       Medical Decision Making  [name] is a 49 y.o. female who presents with: Left loot dorsal ulcer, hypotension likely multifactorial   I don't believe her left foot is salvageable.  I think debridement of the eschar would likely reveal tendons.  Patient's hypotension may represent ascending cellulitis resulting in bacteremia.  Broad spectrum antibiotics recommended.  Patient is reluctant to proceed with a L BKA.  Would obtained BLE ABI to get a more objective measure of the flow in both legs.  If abnormal, would consider LLE angiography to determine any reconstruction options.  I discussed in depth with the patient the nature of atherosclerosis, and emphasized the importance of maximal medical management including strict control of blood pressure, blood glucose, and lipid levels, antiplatelet agents, obtaining regular exercise, and cessation of smoking.  The patient is  aware that without maximal medical management the underlying atherosclerotic disease process will progress, limiting the benefit of any interventions. The patient is currently on a statin:  Crestor. The patient is currently not on an anti-platelet.  The patient will be started on ASA 81 mg PO daily.  Thank you for allowing [place] to participate in this patient's care.   Adele Barthel, MD [facility] [place] Office: [phone] Pager: [phone]  11/12/2014, 3:09 PM    Addendum  VASCULAR LAB PRELIMINARY  ARTERIAL  ABI completed: Left BP significantly lower than Right BP. Patient has known lower Left BP.    RIGHT    LEFT    PRESSURE WAVEFORM  PRESSURE WAVEFORM  BRACHIAL 112 Tri BRACHIAL 76 Tri  DP   DP  AT 42 Tri AT Could not obtain due to ulcer location   PT 91 Tri PT 63 [place]  PER   PER 59 [place]  GREAT TOE  NA GREAT TOE  NA    RIGHT LEFT  ABI 0.91, mild arterial disease 0.53, severe arterial disease        - will offer patient angiography on Monday - rehydration and resuscitation over the weekend   Adele Barthel, MD [facility] [place] Office: [phone] Pager: [phone]  11/12/2014, 3:24 PM

## 2014-11-15 NOTE — Progress Notes (Signed)
   Daily Progress Note  Filed Vitals:   11/15/14 1605 11/15/14 1610 11/15/14 1615 11/15/14 1630  BP: 80/50 83/49 86/49  89/57  Pulse: 63 63 62 63  Temp:      TempSrc:      Resp: 10 9 10 10   Height:      Weight:      SpO2: 97% 98% 97% 98%   BP better with bolus running.  No abd pain.  No signs of bleeding.  Left foot is pink.  - suspect some reperfusion syndrome leading to hypotension, i.e. reperfusion of dead tissue leading to dissipation of cytokines resulting from cell death - supportive measures including upgrade to full ICU support with pressor support may be needed temporarily - continue volume resuscitation for now  Adele Barthel, MD [facility] [place] Office: [phone] Pager: [phone]  11/15/2014, 4:44 PM

## 2014-11-15 NOTE — Progress Notes (Signed)
Pharmacy Antibiotic Time-Out Note  [name] is a 49 y.o. year-old female admitted on 11/09/2014.  The patient is currently on Vancomycin and Zosyn for diabetic foot infection and cellulitis.  Assessment/Plan: MRI shows no evidence of OM, +anaerobic cellulitis.  X-Ray shows possible subcutaneous emphysema.  Plan for vascular surgery to perform angiogram 11/14 and plastic surgery to perform wound debridement 11/15.  ID recommends to narrow to Unasyn once hemodynamically stable.  Currently pt remains in ICU-SD due to hypotension.  Renal function stable.   Hx HIV - HAART resumed.  CBGs are uncontrolled despite resistant SSI with meal coverage and lantus 70units daily (PTA toujeo, victoza, SSI), A1c = 10.7.    Continue Vancomycin 1g IV q24h and Zosyn 3.375g IV q8h (infuse over 4 hours) F/u renal fxn and narrowing to Unasyn when appropriate Recheck VT at Css if warranted   Recent Labs Lab 11/12/14 0547 11/13/14 0420 11/13/14 1157 11/14/14 0340 11/15/14 0350  WBC 13.0* 11.8* 11.3* 12.3* 12.9*    Recent Labs Lab 11/12/14 0547 11/12/14 2238 11/13/14 0420 11/14/14 0340 11/15/14 0350  CREATININE 2.04* 1.73* 1.71* 1.71* 1.74*   Estimated Creatinine Clearance: 52.6 mL/min (by C-G formula based on Cr of 1.74). Tmax/24h 99  Antimicrobial allergies: None  Antimicrobials this admission: 11/9 >> zosyn  >> 11/9 >> vancomycin  >>   Levels/dose changes this admission: 11/11 VT at 1900 = 21 on 1250mg  q24 (trough 15-20 d/t extent of infection) --> reduce to 1g IV q24h  Microbiology Results: 11/9 wound from L foot: abundant GPC in pairs/clusters, GNR - multiple organisms none predominant 11/9 MRSA PCR: negative 11/13 CDiff antigen negative /toxin negative  Thank you for allowing pharmacy to be a part of this patient's care.  Ralene Bathe, PharmD, BCPS 11/15/2014, 9:51 AM  Pager: [phone]

## 2014-11-15 NOTE — Progress Notes (Signed)
Dr Bridgett Larsson paged regarding consistently low BP readings.  Asymptomatic. Pt states her BP has been low "for a few days". Awaiting response to page.

## 2014-11-15 NOTE — Progress Notes (Addendum)
   Daily Progress Note  Filed Vitals:   11/15/14 1505 11/15/14 1510 11/15/14 1520 11/15/14 1525  BP: 67/33 71/42 74/50  74/48  Pulse: 73 72 68 72  Temp:      TempSrc:      Resp: 11 12 11 12   Height:      Weight:      SpO2: 96% 96% 96% 94%    Pt is asx from her hypotension.  No evidence of RPH.  Pt had a single anterior artery stick with micropuncture needle under ultrasound guidance in the common femoral artery.  I suspect this patient's hypotension may be related to either inadequate volume resuscitation, narcotics (as BP slowly drifted down during the case), or reperfusion injury due to resolution of left distal SFA occlusion.  Pt's BP was also in the 70-80s at [facility], likely due to SIRS vs sepsis.  - bolus 1 L NS   Adele Barthel, MD [facility] [place] Office: [phone] Pager: [phone]  11/15/2014, 3:33 PM

## 2014-11-15 NOTE — Interval H&P Note (Signed)
[facility] [place]  History and Physical Update  The patient was interviewed and re-examined.  The patient's previous History and Physical has been reviewed and is unchanged from my consult.  There is no change in the plan of care: aortogram, left leg angiogram, possible intervention.    BMET    Component Value Date/Time   NA 137 11/15/2014 0350   K 4.2 11/15/2014 0350   CL 100* 11/15/2014 0350   CO2 28 11/15/2014 0350   GLUCOSE 125* 11/15/2014 0350   BUN 27* 11/15/2014 0350   CREATININE 1.74* 11/15/2014 0350   CREATININE 1.81* 09/28/2014 1536   CALCIUM 9.1 11/15/2014 0350   GFRNONAA 33* 11/15/2014 0350   GFRNONAA 39* 12/09/2013 1533   GFRAA 38* 11/15/2014 0350   GFRAA 45* 12/09/2013 1533    .  Adele Barthel, MD [facility] [place] Office: [phone] Pager: [phone]  11/15/2014, 11:34 AM

## 2014-11-16 ENCOUNTER — Inpatient Hospital Stay (HOSPITAL_COMMUNITY): Payer: Medicare HMO | Admitting: Certified Registered Nurse Anesthetist

## 2014-11-16 ENCOUNTER — Inpatient Hospital Stay (HOSPITAL_COMMUNITY): Payer: Medicare HMO

## 2014-11-16 ENCOUNTER — Telehealth: Payer: Self-pay | Admitting: Vascular Surgery

## 2014-11-16 ENCOUNTER — Encounter (HOSPITAL_COMMUNITY): Payer: Self-pay | Admitting: Vascular Surgery

## 2014-11-16 ENCOUNTER — Encounter (HOSPITAL_COMMUNITY)
Admission: EM | Disposition: A | Discharge status: Rehabilitation Facility | Payer: Self-pay | Source: Home / Self Care | Attending: Internal Medicine

## 2014-11-16 ENCOUNTER — Other Ambulatory Visit: Payer: Self-pay | Admitting: *Deleted

## 2014-11-16 DIAGNOSIS — Z9862 Peripheral vascular angioplasty status: Secondary | ICD-10-CM

## 2014-11-16 DIAGNOSIS — I10 Essential (primary) hypertension: Secondary | ICD-10-CM

## 2014-11-16 DIAGNOSIS — I739 Peripheral vascular disease, unspecified: Secondary | ICD-10-CM

## 2014-11-16 HISTORY — PX: I&D EXTREMITY: SHX5045

## 2014-11-16 LAB — MAGNESIUM: MAGNESIUM: 1.9 mg/dL (ref 1.7–2.4)

## 2014-11-16 LAB — GLUCOSE, CAPILLARY
GLUCOSE-CAPILLARY: 58 mg/dL — AB (ref 65–99)
GLUCOSE-CAPILLARY: 119 mg/dL — AB (ref 65–99)
Glucose-Capillary: 113 mg/dL — ABNORMAL HIGH (ref 65–99)
Glucose-Capillary: 76 mg/dL (ref 65–99)
Glucose-Capillary: 104 mg/dL — ABNORMAL HIGH (ref 65–99)
Glucose-Capillary: 92 mg/dL (ref 65–99)
Glucose-Capillary: 228 mg/dL — ABNORMAL HIGH (ref 65–99)

## 2014-11-16 LAB — COMPREHENSIVE METABOLIC PANEL
ALK PHOS: 78 U/L (ref 38–126)
ALT: 9 U/L — ABNORMAL LOW (ref 14–54)
ANION GAP: 7 (ref 5–15)
AST: 17 U/L (ref 15–41)
Albumin: 2 g/dL — ABNORMAL LOW (ref 3.5–5.0)
BILIRUBIN TOTAL: 0.2 mg/dL — AB (ref 0.3–1.2)
BUN: 19 mg/dL (ref 6–20)
CALCIUM: 8.9 mg/dL (ref 8.9–10.3)
CO2: 25 mmol/L (ref 22–32)
Chloride: 109 mmol/L (ref 101–111)
Creatinine, Ser: 1.64 mg/dL — ABNORMAL HIGH (ref 0.44–1.00)
GFR, EST AFRICAN AMERICAN: 41 mL/min — AB (ref >60)
GFR, EST NON AFRICAN AMERICAN: 36 mL/min — AB (ref >60)
Glucose, Bld: 70 mg/dL (ref 65–99)
Potassium: 3.4 mmol/L — ABNORMAL LOW (ref 3.5–5.1)
SODIUM: 141 mmol/L (ref 135–145)
TOTAL PROTEIN: 6.5 g/dL (ref 6.5–8.1)

## 2014-11-16 LAB — LACTIC ACID, PLASMA
LACTIC ACID, VENOUS: 1.2 mmol/L (ref 0.5–2.0)
LACTIC ACID, VENOUS: 0.8 mmol/L (ref 0.5–2.0)

## 2014-11-16 LAB — CBC
HCT: 26.6 % — ABNORMAL LOW (ref 36.0–46.0)
Hemoglobin: 8.5 g/dL — ABNORMAL LOW (ref 12.0–15.0)
MCH: 26.9 pg (ref 26.0–34.0)
MCHC: 32 g/dL (ref 30.0–36.0)
MCV: 84.2 fL (ref 78.0–100.0)
PLATELETS: ADEQUATE 10*3/uL (ref 150–400)
RBC: 3.16 MIL/uL — ABNORMAL LOW (ref 3.87–5.11)
RDW: 17.6 % — AB (ref 11.5–15.5)
WBC: 10.1 10*3/uL (ref 4.0–10.5)

## 2014-11-16 LAB — CORTISOL: Cortisol, Plasma: 9.2 ug/dL

## 2014-11-16 LAB — TSH: TSH: 2.358 u[IU]/mL (ref 0.350–4.500)

## 2014-11-16 SURGERY — IRRIGATION AND DEBRIDEMENT EXTREMITY
Anesthesia: General | Site: Foot | Laterality: Left

## 2014-11-16 MED ORDER — ONDANSETRON HCL 4 MG/2ML IJ SOLN
INTRAMUSCULAR | Status: DC | PRN
  Administered 2014-11-16: 4 mg via INTRAVENOUS

## 2014-11-16 MED ORDER — DEXTROSE-NACL 5-0.45 % IV SOLN: INTRAVENOUS | Status: DC

## 2014-11-16 MED ORDER — HYDROMORPHONE HCL 1 MG/ML IJ SOLN: 0.2500 mg | INTRAMUSCULAR | Status: DC | PRN

## 2014-11-16 MED ORDER — LIDOCAINE HCL (CARDIAC) 20 MG/ML IV SOLN
INTRAVENOUS | Status: AC
  Filled 2014-11-16: qty 5

## 2014-11-16 MED ORDER — MEPERIDINE HCL 25 MG/ML IJ SOLN: 6.2500 mg | INTRAMUSCULAR | Status: DC | PRN

## 2014-11-16 MED ORDER — PROPOFOL 10 MG/ML IV BOLUS
INTRAVENOUS | Status: DC | PRN
  Administered 2014-11-16: 160 mg via INTRAVENOUS

## 2014-11-16 MED ORDER — EPHEDRINE SULFATE 50 MG/ML IJ SOLN
INTRAMUSCULAR | Status: DC | PRN
  Administered 2014-11-16 (×3): 10 mg via INTRAVENOUS

## 2014-11-16 MED ORDER — ROCURONIUM BROMIDE 50 MG/5ML IV SOLN
INTRAVENOUS | Status: AC
  Filled 2014-11-16: qty 1

## 2014-11-16 MED ORDER — INSULIN ASPART 100 UNIT/ML ~~LOC~~ SOLN
0.0000 [IU] | Freq: Three times a day (TID) | SUBCUTANEOUS | Status: DC
Start: 2014-11-17 — End: 2014-11-18
  Administered 2014-11-17: 1 [IU] via SUBCUTANEOUS
  Administered 2014-11-17: 2 [IU] via SUBCUTANEOUS

## 2014-11-16 MED ORDER — MIDAZOLAM HCL 5 MG/5ML IJ SOLN
INTRAMUSCULAR | Status: DC | PRN
  Administered 2014-11-16: 2 mg via INTRAVENOUS

## 2014-11-16 MED ORDER — PROPOFOL 10 MG/ML IV BOLUS
INTRAVENOUS | Status: AC
  Filled 2014-11-16: qty 20

## 2014-11-16 MED ORDER — FENTANYL CITRATE (PF) 100 MCG/2ML IJ SOLN
INTRAMUSCULAR | Status: DC | PRN
  Administered 2014-11-16: 100 ug via INTRAVENOUS

## 2014-11-16 MED ORDER — POTASSIUM CHLORIDE 10 MEQ/100ML IV SOLN: 10.0000 meq | INTRAVENOUS | Status: DC

## 2014-11-16 MED ORDER — PROMETHAZINE HCL 25 MG/ML IJ SOLN: 6.2500 mg | INTRAMUSCULAR | Status: DC | PRN

## 2014-11-16 MED ORDER — 0.9 % SODIUM CHLORIDE (POUR BTL) OPTIME
TOPICAL | Status: DC | PRN
  Administered 2014-11-16: 1000 mL

## 2014-11-16 MED ORDER — LIDOCAINE HCL (CARDIAC) 20 MG/ML IV SOLN
INTRAVENOUS | Status: DC | PRN
  Administered 2014-11-16: 100 mg via INTRAVENOUS

## 2014-11-16 MED ORDER — MIDAZOLAM HCL 2 MG/2ML IJ SOLN
INTRAMUSCULAR | Status: AC
  Filled 2014-11-16: qty 4

## 2014-11-16 MED ORDER — FENTANYL CITRATE (PF) 250 MCG/5ML IJ SOLN
INTRAMUSCULAR | Status: AC
  Filled 2014-11-16: qty 5

## 2014-11-16 MED ORDER — PHENYLEPHRINE HCL 10 MG/ML IJ SOLN
INTRAMUSCULAR | Status: DC | PRN
  Administered 2014-11-16: 80 ug via INTRAVENOUS
  Administered 2014-11-16: 120 ug via INTRAVENOUS
  Administered 2014-11-16: 80 ug via INTRAVENOUS

## 2014-11-16 MED ORDER — LACTATED RINGERS IV SOLN
INTRAVENOUS | Status: DC
Start: 2014-11-16 — End: 2014-11-18
  Administered 2014-11-16 – 2014-11-18 (×4): via INTRAVENOUS

## 2014-11-16 MED ORDER — SUGAMMADEX SODIUM 200 MG/2ML IV SOLN
INTRAVENOUS | Status: AC
  Filled 2014-11-16: qty 2

## 2014-11-16 MED ORDER — INSULIN GLARGINE 100 UNIT/ML ~~LOC~~ SOLN
35.0000 [IU] | Freq: Every day | SUBCUTANEOUS | Status: DC
  Filled 2014-11-16 (×3): qty 0.35

## 2014-11-16 MED ORDER — ONDANSETRON HCL 4 MG/2ML IJ SOLN
INTRAMUSCULAR | Status: AC
  Filled 2014-11-16: qty 2

## 2014-11-16 MED ORDER — INSULIN ASPART 100 UNIT/ML ~~LOC~~ SOLN
0.0000 [IU] | Freq: Every day | SUBCUTANEOUS | Status: DC
  Administered 2014-11-16: 2 [IU] via SUBCUTANEOUS

## 2014-11-16 MED ORDER — DEXTROSE 50 % IV SOLN
25.0000 mL | Freq: Once | INTRAVENOUS | Status: AC
  Administered 2014-11-16: 25 mL via INTRAVENOUS

## 2014-11-16 MED ORDER — DEXTROSE 50 % IV SOLN
INTRAVENOUS | Status: AC
  Administered 2014-11-16: 25 mL via INTRAVENOUS
  Filled 2014-11-16: qty 50

## 2014-11-16 MED ORDER — SUCCINYLCHOLINE CHLORIDE 20 MG/ML IJ SOLN
INTRAMUSCULAR | Status: DC | PRN
  Administered 2014-11-16: 80 mg via INTRAVENOUS

## 2014-11-16 MED ORDER — SODIUM CHLORIDE 0.9 % IV BOLUS (SEPSIS)
1000.0000 mL | INTRAVENOUS | Status: AC
  Administered 2014-11-16: 1000 mL via INTRAVENOUS

## 2014-11-16 MED ORDER — ZOLPIDEM TARTRATE 5 MG PO TABS
5.0000 mg | ORAL_TABLET | Freq: Once | ORAL | Status: AC
  Administered 2014-11-16: 5 mg via ORAL
  Filled 2014-11-16: qty 1

## 2014-11-16 MED ORDER — POTASSIUM CHLORIDE CRYS ER 20 MEQ PO TBCR
40.0000 meq | EXTENDED_RELEASE_TABLET | Freq: Once | ORAL | Status: AC
  Administered 2014-11-16: 40 meq via ORAL
  Filled 2014-11-16: qty 2

## 2014-11-16 SURGICAL SUPPLY — 44 items
BANDAGE ELASTIC 4 VELCRO ST LF (GAUZE/BANDAGES/DRESSINGS) IMPLANT
BLADE 10 SAFETY STRL DISP (BLADE) ×3 IMPLANT
BNDG COHESIVE 4X5 TAN STRL (GAUZE/BANDAGES/DRESSINGS) ×3 IMPLANT
BNDG GAUZE ELAST 4 BULKY (GAUZE/BANDAGES/DRESSINGS) ×3 IMPLANT
CANISTER SUCTION 2500CC (MISCELLANEOUS) ×3 IMPLANT
CANISTER WOUND CARE 500ML ATS (WOUND CARE) ×3 IMPLANT
COVER SURGICAL LIGHT HANDLE (MISCELLANEOUS) ×3 IMPLANT
DRAPE EXTREMITY T 121X128X90 (DRAPE) IMPLANT
DRAPE ORTHO SPLIT 77X108 STRL (DRAPES)
DRAPE SURG ORHT 6 SPLT 77X108 (DRAPES) IMPLANT
DRSG ADAPTIC 3X8 NADH LF (GAUZE/BANDAGES/DRESSINGS) ×6 IMPLANT
DRSG PAD ABDOMINAL 8X10 ST (GAUZE/BANDAGES/DRESSINGS) IMPLANT
DRSG VAC ATS LRG SENSATRAC (GAUZE/BANDAGES/DRESSINGS) ×3 IMPLANT
ELECT REM PT RETURN 9FT ADLT (ELECTROSURGICAL) ×3
ELECTRODE REM PT RTRN 9FT ADLT (ELECTROSURGICAL) ×1 IMPLANT
GAUZE SPONGE 4X4 12PLY STRL (GAUZE/BANDAGES/DRESSINGS) IMPLANT
GLOVE BIO SURGEON STRL SZ 6 (GLOVE) ×3 IMPLANT
GLOVE BIO SURGEON STRL SZ8.5 (GLOVE) ×3 IMPLANT
GLOVE BIOGEL PI IND STRL 6 (GLOVE) ×1 IMPLANT
GLOVE BIOGEL PI IND STRL 8.5 (GLOVE) ×1 IMPLANT
GLOVE BIOGEL PI INDICATOR 6 (GLOVE) ×2
GLOVE BIOGEL PI INDICATOR 8.5 (GLOVE) ×2
GLOVE SURG SS PI 7.0 STRL IVOR (GLOVE) ×6 IMPLANT
GLOVE SURG SS PI 7.5 STRL IVOR (GLOVE) ×3 IMPLANT
GOWN STRL REUS W/ TWL LRG LVL3 (GOWN DISPOSABLE) ×2 IMPLANT
GOWN STRL REUS W/ TWL XL LVL3 (GOWN DISPOSABLE) ×1 IMPLANT
GOWN STRL REUS W/TWL LRG LVL3 (GOWN DISPOSABLE) ×4
GOWN STRL REUS W/TWL XL LVL3 (GOWN DISPOSABLE) ×2
HANDPIECE INTERPULSE COAX TIP (DISPOSABLE)
KIT BASIN OR (CUSTOM PROCEDURE TRAY) ×3 IMPLANT
KIT ROOM TURNOVER OR (KITS) ×3 IMPLANT
NS IRRIG 1000ML POUR BTL (IV SOLUTION) ×3 IMPLANT
PACK GENERAL/GYN (CUSTOM PROCEDURE TRAY) ×3 IMPLANT
PAD ARMBOARD 7.5X6 YLW CONV (MISCELLANEOUS) ×6 IMPLANT
SCRUB BETADINE 4OZ XXX (MISCELLANEOUS) ×3 IMPLANT
SET HNDPC FAN SPRY TIP SCT (DISPOSABLE) IMPLANT
SPONGE GAUZE 4X4 12PLY STER LF (GAUZE/BANDAGES/DRESSINGS) ×3 IMPLANT
STOCKINETTE IMPERVIOUS 9X36 MD (GAUZE/BANDAGES/DRESSINGS) ×3 IMPLANT
SUT VIC AB 3-0 SH 27 (SUTURE) ×2
SUT VIC AB 3-0 SH 27XBRD (SUTURE) ×1 IMPLANT
TOWEL OR 17X24 6PK STRL BLUE (TOWEL DISPOSABLE) ×3 IMPLANT
TOWEL OR 17X26 10 PK STRL BLUE (TOWEL DISPOSABLE) ×3 IMPLANT
UNDERPAD 30X30 INCONTINENT (UNDERPADS AND DIAPERS) ×3 IMPLANT
WATER STERILE IRR 1000ML POUR (IV SOLUTION) IMPLANT

## 2014-11-16 NOTE — Progress Notes (Signed)
  Echocardiogram 2D Echocardiogram has been performed.  [name] 11/16/2014, 10:37 AM

## 2014-11-16 NOTE — Anesthesia Procedure Notes (Signed)
Procedure Name: Intubation Date/Time: 11/16/2014 2:01 PM Performed by: Trixie Deis A Pre-anesthesia Checklist: Patient identified, Timeout performed, Emergency Drugs available, Suction available and Patient being monitored Patient Re-evaluated:Patient Re-evaluated prior to inductionOxygen Delivery Method: Circle system utilized Preoxygenation: Pre-oxygenation with 100% oxygen Intubation Type: IV induction Ventilation: Mask ventilation without difficulty Laryngoscope Size: Mac and 3 Grade View: Grade I Tube type: Oral Tube size: 7.0 mm Number of attempts: 1 Airway Equipment and Method: Stylet Placement Confirmation: ETT inserted through vocal cords under direct vision,  breath sounds checked- equal and bilateral and positive ETCO2 Secured at: 21 cm Tube secured with: Tape Dental Injury: Teeth and Oropharynx as per pre-operative assessment and Injury to lip

## 2014-11-16 NOTE — H&P (View-Only) (Signed)
Reason for Consult: diabetic foot ulcerations Referring Physician: Dr. Lewanda Rife Location : [facility] inpatient Date: 11/11/2014  [name] is an 49 y.o. female.  HPI: Consulted for evaluation foot ulcerations in this poorly controlled diabetic. Onset wound few months ago and worse over last week. Wound care prior to admission silvadene. MR with soft tissue air noted but no evidence osteomyelitis. She reports she quit smoking last month. No current wound care ordered. No vascular screening or work up to date.  PMH significant for HIV managed by Dr. Johnnye Sima   No recent prealbumin. She was seen once in [place] 04/2014 for left lateral foot wound, she did not follow up in the clinic but states she healed that wound. She was referred to Vascular Surgery from the wound center for screening; states she was sick at time of appt and was not able to keep.   Past Medical History  Diagnosis Date  . Anxiety   . Depression   . Hypertension   . HIV infection ([place]) 2000  . Asthma     HOSPITALIZED WITH EXCERBATION OF ASTHMA - AND BRONCHITIS AND THE FLU DEC 2013  . Shortness of breath     ALLERGIES ARE "ACTING UP"  . Diabetes mellitus     ON INSULIN AND ORAL MEDICATIONS  . GERD (gastroesophageal reflux disease)   . Neuropathy     NEUROPATHY HANDS AND FEET  . Arthritis     LEFT HIP  . Pain     SEVERE PAIN LEFT HIP - HX OF LEFT HIP PINNING JULY 2013    Past Surgical History  Procedure Laterality Date  . Hip pinning,cannulated  07/16/2011    Procedure: CANNULATED HIP PINNING;  Surgeon: Gearlean Alf, MD;  Location: [facility];  Service: Orthopedics;  Laterality: Left;  . Hip pinning,cannulated Left 04/09/2012    Procedure: CANNULATED HIP PINNING AND HARDWARE REVISION;  Surgeon: Gearlean Alf, MD;  Location: [facility];  Service: Orthopedics;  Laterality: Left;  . Hip arthroplasty Left 05/05/2014    Procedure: ARTHROPLASTY OPEN REDUCTION INTERNAL FIXATION LEFT HIP AND REMOVAL OF TWO CANNULATED  SCREW;  Surgeon: Latanya Maudlin, MD;  Location: [facility];  Service: Orthopedics;  Laterality: Left;  . Cast application Left 08/04/4194    Procedure: CAST APPLICATION (FIBERGLASS);  Surgeon: Latanya Maudlin, MD;  Location: [facility];  Service: Orthopedics;  Laterality: Left;  CLOSED REDUCTION OF LEFT COLLES FRACTURE WITH SHORT ARM CAST  . Hardware removal Left 05/05/2014    Procedure: HARDWARE REMOVAL LEFT HIP;  Surgeon: Latanya Maudlin, MD;  Location: [facility];  Service: Orthopedics;  Laterality: Left;  REMOVAL BIOMET 6.5-8.0 CANNULATED SCREW    Family History  Problem Relation Age of Onset  . Diabetes Mother   . Hypertension Mother   . Vision loss Mother   . Heart disease Mother   . Hypertension Father   . Thyroid disease Neg Hx     Social History:  reports that she has been smoking E-cigarettes.  She has a 17.5 pack-year smoking history. She has never used smokeless tobacco. She reports that she does not drink alcohol or use illicit drugs.  Allergies:  Allergies  Allergen Reactions  . Cortizone-10 [Hydrocortisone] Rash    Per patient, given local injection at knee and developed rash at local site.     Medications: I have reviewed the patient's current medications.  Results for orders placed or performed during the hospital encounter of 11/09/14 (from the past 48 hour(s))  Glucose, capillary  Status: Abnormal   Collection Time: 11/09/14  8:31 PM  Result Value Ref Range   Glucose-Capillary 209 (H) 65 - 99 mg/dL  MRSA PCR Screening     Status: None   Collection Time: 11/10/14  2:22 AM  Result Value Ref Range   MRSA by PCR NEGATIVE NEGATIVE    Comment:        The GeneXpert MRSA Assay (FDA approved for NASAL specimens only), is one component of a comprehensive MRSA colonization surveillance program. It is not intended to diagnose MRSA infection nor to guide or monitor treatment for MRSA infections.   Wound culture     Status: None (Preliminary result)   Collection Time: 11/10/14   2:22 AM  Result Value Ref Range   Specimen Description WOUND L FOOT    Special Requests NONE    Gram Stain      NO WBC SEEN NO SQUAMOUS EPITHELIAL CELLS SEEN ABUNDANT GRAM POSITIVE COCCI IN PAIRS IN CLUSTERS ABUNDANT GRAM NEGATIVE RODS Performed at [facility]    Culture      Culture reincubated for better growth Performed at [facility]    Report Status PENDING   CBC     Status: Abnormal   Collection Time: 11/10/14  5:55 AM  Result Value Ref Range   WBC 11.9 (H) 4.0 - 10.5 K/uL   RBC 3.65 (L) 3.87 - 5.11 MIL/uL   Hemoglobin 9.6 (L) 12.0 - 15.0 g/dL   HCT 31.3 (L) 36.0 - 46.0 %   MCV 85.8 78.0 - 100.0 fL   MCH 26.3 26.0 - 34.0 pg   MCHC 30.7 30.0 - 36.0 g/dL   RDW 17.3 (H) 11.5 - 15.5 %   Platelets 478 (H) 150 - 400 K/uL  Comprehensive metabolic panel     Status: Abnormal   Collection Time: 11/10/14  5:55 AM  Result Value Ref Range   Sodium 135 135 - 145 mmol/L   Potassium 3.5 3.5 - 5.1 mmol/L    Comment: DELTA CHECK NOTED REPEATED TO VERIFY    Chloride 94 (L) 101 - 111 mmol/L   CO2 30 22 - 32 mmol/L   Glucose, Bld 158 (H) 65 - 99 mg/dL   BUN 30 (H) 6 - 20 mg/dL   Creatinine, Ser 2.00 (H) 0.44 - 1.00 mg/dL   Calcium 9.7 8.9 - 10.3 mg/dL   Total Protein 7.8 6.5 - 8.1 g/dL   Albumin 2.6 (L) 3.5 - 5.0 g/dL   AST 13 (L) 15 - 41 U/L   ALT 11 (L) 14 - 54 U/L   Alkaline Phosphatase 102 38 - 126 U/L   Total Bilirubin 0.6 0.3 - 1.2 mg/dL   GFR calc non Af Amer 28 (L) >60 mL/min   GFR calc Af Amer 32 (L) >60 mL/min    Comment: (NOTE) The eGFR has been calculated using the CKD EPI equation. This calculation has not been validated in all clinical situations. eGFR's persistently <60 mL/min signify possible Chronic Kidney Disease.    Anion gap 11 5 - 15  Protime-INR     Status: Abnormal   Collection Time: 11/10/14  5:55 AM  Result Value Ref Range   Prothrombin Time 16.1 (H) 11.6 - 15.2 seconds   INR 1.27 0.00 - 1.49  Hemoglobin A1c     Status:  Abnormal   Collection Time: 11/10/14  5:55 AM  Result Value Ref Range   Hgb A1c MFr Bld 10.7 (H) 4.8 - 5.6 %    Comment: (  NOTE)         Pre-diabetes: 5.7 - 6.4         Diabetes: >6.4         Glycemic control for adults with diabetes: <7.0    Mean Plasma Glucose 260 mg/dL    Comment: (NOTE) Performed At: [facility] [place], [place] [id number] Lindon Romp MD [id number]   TSH     Status: None   Collection Time: 11/10/14  5:55 AM  Result Value Ref Range   TSH 0.843 0.350 - 4.500 uIU/mL  Glucose, capillary     Status: Abnormal   Collection Time: 11/10/14  7:48 AM  Result Value Ref Range   Glucose-Capillary 126 (H) 65 - 99 mg/dL  Glucose, capillary     Status: Abnormal   Collection Time: 11/10/14 12:15 PM  Result Value Ref Range   Glucose-Capillary 155 (H) 65 - 99 mg/dL  Glucose, capillary     Status: Abnormal   Collection Time: 11/10/14  5:05 PM  Result Value Ref Range   Glucose-Capillary 277 (H) 65 - 99 mg/dL  Glucose, capillary     Status: Abnormal   Collection Time: 11/10/14  9:13 PM  Result Value Ref Range   Glucose-Capillary 314 (H) 65 - 99 mg/dL  CBC with Differential/Platelet     Status: Abnormal   Collection Time: 11/11/14  5:25 AM  Result Value Ref Range   WBC 13.7 (H) 4.0 - 10.5 K/uL   RBC 3.76 (L) 3.87 - 5.11 MIL/uL   Hemoglobin 9.9 (L) 12.0 - 15.0 g/dL   HCT 31.9 (L) 36.0 - 46.0 %   MCV 84.8 78.0 - 100.0 fL   MCH 26.3 26.0 - 34.0 pg   MCHC 31.0 30.0 - 36.0 g/dL   RDW 17.3 (H) 11.5 - 15.5 %   Platelets 459 (H) 150 - 400 K/uL   Neutrophils Relative % 78 %   Neutro Abs 10.7 (H) 1.7 - 7.7 K/uL   Lymphocytes Relative 12 %   Lymphs Abs 1.6 0.7 - 4.0 K/uL   Monocytes Relative 7 %   Monocytes Absolute 1.0 0.1 - 1.0 K/uL   Eosinophils Relative 3 %   Eosinophils Absolute 0.4 0.0 - 0.7 K/uL   Basophils Relative 0 %   Basophils Absolute 0.0 0.0 - 0.1 K/uL  Comprehensive metabolic panel     Status: Abnormal   Collection Time:  11/11/14  5:25 AM  Result Value Ref Range   Sodium 134 (L) 135 - 145 mmol/L   Potassium 3.8 3.5 - 5.1 mmol/L   Chloride 95 (L) 101 - 111 mmol/L   CO2 29 22 - 32 mmol/L   Glucose, Bld 183 (H) 65 - 99 mg/dL   BUN 35 (H) 6 - 20 mg/dL   Creatinine, Ser 2.30 (H) 0.44 - 1.00 mg/dL   Calcium 9.4 8.9 - 10.3 mg/dL   Total Protein 7.6 6.5 - 8.1 g/dL   Albumin 2.5 (L) 3.5 - 5.0 g/dL   AST 12 (L) 15 - 41 U/L   ALT 9 (L) 14 - 54 U/L   Alkaline Phosphatase 92 38 - 126 U/L   Total Bilirubin 0.5 0.3 - 1.2 mg/dL   GFR calc non Af Amer 24 (L) >60 mL/min   GFR calc Af Amer 27 (L) >60 mL/min    Comment: (NOTE) The eGFR has been calculated using the CKD EPI equation. This calculation has not been validated in all clinical situations. eGFR's persistently <60 mL/min signify possible Chronic  Kidney Disease.    Anion gap 10 5 - 15  Sedimentation rate     Status: Abnormal   Collection Time: 11/11/14  5:25 AM  Result Value Ref Range   Sed Rate 128 (H) 0 - 22 mm/hr  C-reactive protein     Status: Abnormal   Collection Time: 11/11/14  5:25 AM  Result Value Ref Range   CRP 19.2 (H) <1.0 mg/dL    Comment: Performed at [facility]  Glucose, capillary     Status: Abnormal   Collection Time: 11/11/14  7:23 AM  Result Value Ref Range   Glucose-Capillary 159 (H) 65 - 99 mg/dL  Glucose, capillary     Status: Abnormal   Collection Time: 11/11/14 11:57 AM  Result Value Ref Range   Glucose-Capillary 195 (H) 65 - 99 mg/dL  Glucose, capillary     Status: Abnormal   Collection Time: 11/11/14  5:39 PM  Result Value Ref Range   Glucose-Capillary 338 (H) 65 - 99 mg/dL    Mr Foot Left Wo Contrast  11/10/2014  CLINICAL DATA:  Diabetic foot ulcer.  Redness and swelling. EXAM: MRI OF THE LEFT FOREFOOT WITHOUT CONTRAST TECHNIQUE: Multiplanar, multisequence MR imaging was performed. No intravenous contrast was administered. COMPARISON:  Radiographs dated 11/09/2014 FINDINGS: There is extensive subcutaneous gas  on the dorsum of the midfoot. No discrete abscesses. There is no bone destruction in the midfoot although there is edema in the cuneiforms, cuboid, and navicular as well as slight edema in the bases of the metatarsals. There is a chronic Freiberg's infraction of the head of the second metatarsal, avascular necrosis. There is small ankle joint effusion. Visualized muscles and tendons are normal. IMPRESSION: Findings consistent with anaerobic cellulitis of the dorsum of the foot. The edema in the bones of the midfoot are most likely stress reactions with secondary to diabetic neuropathy. The findings are not consistent with avascular necrosis in the midfoot or osteomyelitis. Electronically Signed   By: Lorriane Shire M.D.   On: 11/10/2014 07:37    ROS Blood pressure 112/56, pulse 72, temperature 98.4 F (36.9 C), temperature source Axillary, resp. rate 18, height _0  (1.778 m), weight 110.2 kg (242 lb 15.2 oz), last menstrual period 11/16/2010, SpO2 99 %. Physical Exam Alert, NAD Left foot eschar dorsum approximately 9 x 8 cm. Able to minimally move toes. Diminished sensation present. Unable to palpate PT; DP weak Edema foot with erythema distal leg Eschar has one hole that has necrotic fat and fluid able to be expressed from it.  Assessment/Plan: Agree with prior orthopaedic assessments that she is high risk amputation in this poorly controlled diabetic. She desires limb salvage and states bone not involved and she feels can be saved. She will require debridement, counseled the skin over near entire dorsum foot has full thickness necrosis and there will be exposure tendons and possible bone following debridement, I am unable to tell depth of necrosis presently. There are no local flaps to cover this area wound. If bone exposed would have to consider referral for free flap coverage. Will likely require VAC, plan possible Integra treatment at time of this debridement or in future. We can make plans for  this early next week.   I would recommend Vascular surgery evaluate patient prior to debridement, counseled patient that if inflow to leg is insufficient, it will not support wound healing and aggressive debridement will lead to larger and larger wounds.  Recommend dry dressing alone to area until time of  debridement.  Irene Limbo, MD [facility] Plastic [facility] Reconstructive Surgery [phone]

## 2014-11-16 NOTE — Progress Notes (Signed)
Report received via Conservation officer, nature in patient's room using SBAR format, reviewed patient's general condition, VS, Labs and assumed care of pt. Patient's right groin dressing checked and remains C/D/I level 0. Pt was complaining of pain rated 6/10 but was instructed that it wasn't time for her Vicodan and offered her all other pain meds ordered for her but she stated that she wants to wait till she can have her Vicodan, instructed that it would be 20:45 and to call RN, will continue to monitor. Patient's left heel with gauze dressing and Kerlix was starting to come apart, reinforced her dressing with another 4x4 and a Kerlix wrapped over it. She has an area above the dressing that is reddened and slightly warm to the touch that had been previously marked with a magic marker and the reddness has not extended beyond the marking from previous, will continue to monitor. 0030 Patient's Right forearm IV site has her Vancomycin running and it is now starting to leak, charge RN was able to place a new IV in her left forearm which flushed well with a good blood return, will start her fluids in that arm. MD was made aware of her low B/P and manual 72/45 and started her on a 1,000 cc Normal saline IV bolus, will hang as soon as possible.

## 2014-11-16 NOTE — Progress Notes (Signed)
Inpatient Diabetes Program Recommendations  AACE/ADA: New Consensus Statement on Inpatient Glycemic Control (2015)  Target Ranges:  Prepandial:   less than 140 mg/dL      Peak postprandial:   less than 180 mg/dL (1-2 hours)      Critically ill patients:  140 - 180 mg/dL    Results for [name], [name] (MRN [id number]) as of 11/16/2014 09:17  Ref. Range 11/16/2014 06:39 11/16/2014 07:05  Glucose-Capillary Latest Ref Range: 65-99 mg/dL 58 (L) 113 (H)     Note patient NPO this AM.  Recommend we reduce patient's dose of Lantus to 50% of current ordered dose for today while patient NPO.  Called RN caring for patient to discuss.  Recommended to RN that they call Dr. Candiss Norse for Lantus directions.     --Will follow patient during hospitalization--  Wyn Quaker RN, MSN, CDE Diabetes Coordinator Inpatient Glycemic Control Team Team Pager: [phone] (8a-5p)

## 2014-11-16 NOTE — Telephone Encounter (Signed)
LM for pt re appt, dpm °

## 2014-11-16 NOTE — Progress Notes (Signed)
Patient Demographics:    [name], is a 49 y.o. female, DOB - 03-24-1965, [id number]  Admit date - 11/09/2014   Admitting Physician Florencia Reasons, MD  Outpatient Primary MD for the patient is [name]  LOS - 7  Brief summary  Patient admitted on 11/09/2014 [facility] for nonhealing left foot ulcer, patient was started on vancomycin and Fortaz, orthopedic was consulted and currently we're following, MRI shows anaerobic cellulitis with probable subcutaneous emphysema. Orthopedic recommends plastic surgery consult, plastic surgery with attempted debridement but requested vascular surgery consultation.  She was subsequently transferred on 11/16/2014 to [facility] for left foot wound debridement and wound VAC placement by plastic surgery and for a possible angiogram by vascular surgery.  Chief Complaint  Patient presents with  . Wound Infection        Subjective:    [name] Treto today has, No headache, No chest pain, No abdominal pain - No Nausea, No new weakness tingling or numbness, No Cough - SOB. Left foot pain has improved.   Assessment  & Plan :     1. Diabetic left foot infection and gangrene with underlying PAD - MRI shows no evidence of osteomyelitis however clinically there is cellulitis, currently on vancomycin and Zosyn, orthopedic surgery, plastic surgery and vascular surgery on board.   Patient to undergo left foot wound debridement with wound VAC placement on 11/16/2014, possibly angiogram on N 20 01/20/2014 by Dr. Bridgett Larsson. Initial recommendations by Dr. Bridgett Larsson seemed to be left BKA which patient is resistant. Continue supportive care. ID was consulted over the phone by previous attending from [facility].   2. Chronic hypotension worsened by  dehydration and possible sepsis early during the admission. Antihypertensive medications and Lasix have been held, blood pressure has stabilized after IV fluids will monitor. TSH and cortisol are stable.   3. HIV. Follows with Dr. Johnnye Sima. CD4 count 570 in late October 2016. Continue home medications for now.   4. Dyslipidemia. On statin continue.   5. Depression. Continue Abilify and Lexapro. Klonopin as needed for anxiety.   6. CK D4 Baseline creatinine is around 1.9. Monitor.    7.Chronic diastolic dysfunction with EF 60% on this admission. Compensated.   8. DM type II. Currently on Lantus and sliding scale dose adjusted, while nothing by mouth IV D5 drip with close CBG monitoring.  Lab Results  Component Value Date   HGBA1C 10.7* 11/10/2014    CBG (last 3)   Recent Labs  11/16/14 0705 11/16/14 1135 11/16/14 1236  GLUCAP 113* 76 104*       Code Status : Full  Family Communication  : None present  Disposition Plan  : Remain inpatient for now  Consults  :    Orthopedics Plastic surgery  Vascular surgery  Procedures  :   TTE - chronic grade 1 diastolic dysfunction, EF 123456.  ABI left foot severe disease with ABI of 0.53, right leg mild arterial disease with ABI of 0.91.  L foot wound debridement and wound VAC placement on 11/16/2014 by plastic surgery  DVT Prophylaxis  : Heparin   Lab Results  Component Value Date   PLT  11/16/2014    PLATELET CLUMPS NOTED ON SMEAR, COUNT APPEARS ADEQUATE  Inpatient Medications  Scheduled Meds: . ARIPiprazole  10 mg Oral Daily  . aspirin  81 mg Oral Daily  . budesonide-formoterol  2 puff Inhalation BID  . clopidogrel  75 mg Oral Daily  . emtricitabine-tenofovir AF  1 tablet Oral Q breakfast   And  . rilpivirine  25 mg Oral Q breakfast  . escitalopram  20 mg Oral Daily  . feeding supplement (GLUCERNA SHAKE)  237 mL Oral TID BM  . heparin subcutaneous  5,000 Units Subcutaneous 3 times per day  . insulin  aspart  0-15 Units Subcutaneous 4 times per day  . insulin glargine  70 Units Subcutaneous Daily  . methylphenidate  20 mg Oral Daily  . piperacillin-tazobactam (ZOSYN)  IV  3.375 g Intravenous Q8H  . potassium chloride  10 mEq Intravenous Q1 Hr x 4  . pregabalin  150 mg Oral BID  . rosuvastatin  20 mg Oral Daily  . saccharomyces boulardii  250 mg Oral BID  . sodium chloride  500 mL Intravenous Once  . vancomycin  1,000 mg Intravenous Q24H   Continuous Infusions: . dextrose 5 % and 0.45% NaCl     PRN Meds:.acetaminophen, clonazePAM, cyclobenzaprine, HYDROcodone-acetaminophen, ipratropium-albuterol, loperamide, morphine injection, ondansetron (ZOFRAN) IV, oxyCODONE-acetaminophen, traMADol  Antibiotics  :     Anti-infectives    Start     Dose/Rate Route Frequency Ordered Stop   11/12/14 2200  vancomycin (VANCOCIN) IVPB 1000 mg/200 mL premix     1,000 mg 200 mL/hr over 60 Minutes Intravenous Every 24 hours 11/12/14 1956     11/10/14 2000  vancomycin (VANCOCIN) 1,250 mg in sodium chloride 0.9 % 250 mL IVPB  Status:  Discontinued     1,250 mg 166.7 mL/hr over 90 Minutes Intravenous Every 24 hours 11/10/14 0212 11/12/14 1954   11/10/14 0800  emtricitabine-rilpivir-tenofovir AF (ODEFSEY) 200-25-25 MG per tablet 1 tablet  Status:  Discontinued     1 tablet Oral Daily with breakfast 11/09/14 1837 11/10/14 0144   11/10/14 0800  emtricitabine-tenofovir AF (DESCOVY) 200-25 MG per tablet 1 tablet     1 tablet Oral Daily with breakfast 11/10/14 0144     11/10/14 0800  rilpivirine (EDURANT) tablet 25 mg     25 mg Oral Daily with breakfast 11/10/14 0144     11/10/14 0215  piperacillin-tazobactam (ZOSYN) IVPB 3.375 g     3.375 g 12.5 mL/hr over 240 Minutes Intravenous Every 8 hours 11/10/14 0208     11/10/14 0215  vancomycin (VANCOCIN) IVPB 1000 mg/200 mL premix     1,000 mg 200 mL/hr over 60 Minutes Intravenous  Once 11/10/14 0209 11/10/14 0441   11/09/14 1545  vancomycin (VANCOCIN) IVPB 1000  mg/200 mL premix     1,000 mg 200 mL/hr over 60 Minutes Intravenous  Once 11/09/14 1530 11/09/14 1855   11/09/14 1530  piperacillin-tazobactam (ZOSYN) IVPB 3.375 g     3.375 g 12.5 mL/hr over 240 Minutes Intravenous  Once 11/09/14 1530 11/09/14 1758        Objective:   Filed Vitals:   11/16/14 0145 11/16/14 0812 11/16/14 0936 11/16/14 1108  BP: 122/70  115/48   Pulse: 74   66  Temp: 97.7 F (36.5 C) 97.9 F (36.6 C)    TempSrc: Oral Oral    Resp: 13   16  Height:      Weight: 108.863 kg (240 lb)     SpO2: 95%   94%    Wt Readings from Last 3 Encounters:  11/16/14 [id number]  kg (240 lb)  11/09/14 110.224 kg (243 lb)  07/08/14 110.224 kg (243 lb)     Intake/Output Summary (Last 24 hours) at 11/16/14 1242 Last data filed at 11/16/14 0500  Gross per 24 hour  Intake 2491.7 ml  Output   2100 ml  Net  391.7 ml     Physical Exam  Awake Alert, Oriented X 3, No new F.N deficits, Normal affect [place].AT,PERRAL Supple Neck,No JVD, No cervical lymphadenopathy appriciated.  Symmetrical Chest wall movement, Good air movement bilaterally, CTAB RRR,No Gallops,Rubs or new Murmurs, No Parasternal Heave +ve B.Sounds, Abd Soft, No tenderness, No organomegaly appriciated, No rebound - guarding or rigidity. No Cyanosis, Clubbing or edema, No new Rash or bruise L foot under bandage mild surrounding erythema    Data Review:   Micro Results Recent Results (from the past 240 hour(s))  MRSA PCR Screening     Status: None   Collection Time: 11/10/14  2:22 AM  Result Value Ref Range Status   MRSA by PCR NEGATIVE NEGATIVE Final    Comment:        The GeneXpert MRSA Assay (FDA approved for NASAL specimens only), is one component of a comprehensive MRSA colonization surveillance program. It is not intended to diagnose MRSA infection nor to guide or monitor treatment for MRSA infections.   Wound culture     Status: None   Collection Time: 11/10/14  2:22 AM  Result Value Ref Range  Status   Specimen Description WOUND L FOOT  Final   Special Requests NONE  Final   Gram Stain   Final    NO WBC SEEN NO SQUAMOUS EPITHELIAL CELLS SEEN ABUNDANT GRAM POSITIVE COCCI IN PAIRS IN CLUSTERS ABUNDANT GRAM NEGATIVE RODS Performed at [facility]    Culture   Final    MULTIPLE ORGANISMS PRESENT, NONE PREDOMINANT Note: NO STAPHYLOCOCCUS AUREUS ISOLATED NO GROUP A STREP (S.PYOGENES) ISOLATED Performed at [facility]    Report Status 11/12/2014 FINAL  Final  MRSA PCR Screening     Status: None   Collection Time: 11/12/14 12:24 PM  Result Value Ref Range Status   MRSA by PCR NEGATIVE NEGATIVE Final    Comment:        The GeneXpert MRSA Assay (FDA approved for NASAL specimens only), is one component of a comprehensive MRSA colonization surveillance program. It is not intended to diagnose MRSA infection nor to guide or monitor treatment for MRSA infections.   C difficile quick scan w PCR reflex     Status: None   Collection Time: 11/14/14  2:00 PM  Result Value Ref Range Status   C Diff antigen NEGATIVE NEGATIVE Final   C Diff toxin NEGATIVE NEGATIVE Final   C Diff interpretation Negative for toxigenic C. difficile  Final    Radiology Reports Mr Foot Left Wo Contrast  11/10/2014  CLINICAL DATA:  Diabetic foot ulcer.  Redness and swelling. EXAM: MRI OF THE LEFT FOREFOOT WITHOUT CONTRAST TECHNIQUE: Multiplanar, multisequence MR imaging was performed. No intravenous contrast was administered. COMPARISON:  Radiographs dated 11/09/2014 FINDINGS: There is extensive subcutaneous gas on the dorsum of the midfoot. No discrete abscesses. There is no bone destruction in the midfoot although there is edema in the cuneiforms, cuboid, and navicular as well as slight edema in the bases of the metatarsals. There is a chronic Freiberg's infraction of the head of the second metatarsal, avascular necrosis. There is small ankle joint effusion. Visualized muscles and tendons  are normal. IMPRESSION: Findings consistent  with anaerobic cellulitis of the dorsum of the foot. The edema in the bones of the midfoot are most likely stress reactions with secondary to diabetic neuropathy. The findings are not consistent with avascular necrosis in the midfoot or osteomyelitis. Electronically Signed   By: Lorriane Shire M.D.   On: 11/10/2014 07:37   Dg Chest Port 1 View  11/16/2014  CLINICAL DATA:  Hypoglycemia this morning. Cough. Initial encounter. EXAM: PORTABLE CHEST 1 VIEW COMPARISON:  Single view of the chest 07/08/2014 and 11/12/2014. FINDINGS: The lungs are clear. Heart size is normal. No pneumothorax or pleural effusion. Remote right rib fractures are noted. IMPRESSION: No acute disease. Electronically Signed   By: Inge Rise M.D.   On: 11/16/2014 08:00   Dg Chest Port 1 View  11/12/2014  CLINICAL DATA:  Shortness of Breath EXAM: PORTABLE CHEST 1 VIEW COMPARISON:  July 08, 2014 FINDINGS: There is no edema or consolidation. Heart is mildly enlarged with pulmonary vascularity within normal limits. No adenopathy. There are healed rib fractures bilaterally. IMPRESSION: No edema or consolidation.  Stable cardiac prominence. Electronically Signed   By: Lowella Grip III M.D.   On: 11/12/2014 08:11   Dg Foot 2 Views Left  11/09/2014  CLINICAL DATA:  Diabetic foot ulcer on the dorsum of the foot and ankle. EXAM: LEFT FOOT - 2 VIEW COMPARISON:  None. FINDINGS: There is an open wound on the dorsum of the midfoot with extensive subcutaneous emphysema. No obvious destructive bony changes to suggest osteomyelitis. No destructive joint changes to suggest septic arthritis. There is chronic appearing AVN of the second metatarsal head (Freiberg's infraction). IMPRESSION: 1. Open wound on the dorsum of the midfoot with subcutaneous emphysema. 2. No definite plain film findings to suggest septic arthritis or osteomyelitis 3. Chronic AVN of the second metatarsal head. Electronically Signed    By: Marijo Sanes M.D.   On: 11/09/2014 16:20     CBC  Recent Labs Lab 11/12/14 0547 11/13/14 0420 11/13/14 1157 11/14/14 0340 11/15/14 0350 11/15/14 1714 11/16/14 1140  WBC 13.0* 11.8* 11.3* 12.3* 12.9* 10.8* 10.1  HGB 9.1* 7.9* 8.5* 8.6* 8.1* 9.1* 8.5*  HCT 29.7* 25.9* 27.4* 27.6* 26.2* 28.5* 26.6*  PLT 489* 459* 459* 478* 582* 536* PLATELET CLUMPS NOTED ON SMEAR, COUNT APPEARS ADEQUATE  MCV 85.6 84.6 86.2 85.7 84.5 82.6 84.2  MCH 26.2 25.8* 26.7 26.7 26.1 26.4 26.9  MCHC 30.6 30.5 31.0 31.2 30.9 31.9 32.0  RDW 17.6* 17.5* 17.6* 17.6* 17.4* 17.5* 17.6*  LYMPHSABS 1.7 1.8 1.8 1.9 2.2  --   --   MONOABS 0.8 0.8 0.7 0.7 0.7  --   --   EOSABS 0.3 0.4 0.5 0.4 0.4  --   --   BASOSABS 0.0 0.0 0.0 0.0 0.0  --   --     Chemistries   Recent Labs Lab 11/12/14 0547 11/12/14 2238 11/13/14 0420 11/14/14 0340 11/15/14 0350 11/15/14 1714 11/16/14 0835  NA 133* 134* 131* 133* 137  --  141  K 4.3 4.1 3.8 4.2 4.2  --  3.4*  CL 97* 101 99* 99* 100*  --  109  CO2 27 26 26 26 28   --  25  GLUCOSE 228* 321* 195* 220* 125*  --  70  BUN 34* 28* 27* 27* 27*  --  19  CREATININE 2.04* 1.73* 1.71* 1.71* 1.74* 1.69* 1.64*  CALCIUM 9.1 8.7* 8.7* 9.0 9.1  --  8.9  MG  --   --   --   --   --   --  1.9  AST 11*  --  10* 11* 12*  --  17  ALT 9*  --  9* 7* 9*  --  9*  ALKPHOS 79  --  80 72 80  --  78  BILITOT 0.4  --  0.6 0.4 0.8  --  0.2*   ------------------------------------------------------------------------------------------------------------------ estimated creatinine clearance is 54.9 mL/min (by C-G formula based on Cr of 1.64). ------------------------------------------------------------------------------------------------------------------ No results for input(s): HGBA1C in the last 72 hours. ------------------------------------------------------------------------------------------------------------------ No results for input(s): CHOL, HDL, LDLCALC, TRIG, CHOLHDL, LDLDIRECT in the  last 72 hours. ------------------------------------------------------------------------------------------------------------------  Recent Labs  11/16/14 0835  TSH 2.358   ------------------------------------------------------------------------------------------------------------------ No results for input(s): VITAMINB12, FOLATE, FERRITIN, TIBC, IRON, RETICCTPCT in the last 72 hours.  Coagulation profile  Recent Labs Lab 11/10/14 0555 11/15/14 0350  INR 1.27 1.18    No results for input(s): DDIMER in the last 72 hours.  Cardiac Enzymes No results for input(s): CKMB, TROPONINI, MYOGLOBIN in the last 168 hours.  Invalid input(s): CK ------------------------------------------------------------------------------------------------------------------ Invalid input(s): POCBNP   Time Spent in minutes  35   Quay Simkin K M.D on 11/16/2014 at 12:42 PM  Between 7am to 7pm - Pager - [phone]  After 7pm go to [email or web address] - password [facility]  [facility] -  Office  [phone]

## 2014-11-16 NOTE — Progress Notes (Signed)
RN text paged [facility] with patient's recent blood pressure that was taken manually after 1074mL normal saline bolus administered.  Patient requesting something to help her sleep at this time, RN text paged [facility] with that information as well.

## 2014-11-16 NOTE — Interval H&P Note (Signed)
History and Physical Interval Note:  11/16/2014 1:18 PM  Sunday [name]  has presented today for surgery, with the diagnosis of NON PRESSURE ULCER LEFT FOOT DIABETES WITH FOOT ULCER   The various methods of treatment have been discussed with the patient and family. After consideration of risks, benefits and other options for treatment, the patient has consented to  Debridement left foot and possible application wound VAC as a surgical intervention .  The patient's history has been reviewed, patient examined, no change in status, stable for surgery.  I have reviewed the patient's chart and labs.  Questions were answered to the patient's satisfaction.     Fay Bagg

## 2014-11-16 NOTE — Telephone Encounter (Signed)
-----   Message from Mena Goes, RN sent at 11/15/2014  3:43 PM EST ----- Regarding: schedule   ----- Message -----    From: Conrad Donegal, MD    Sent: 11/15/2014   3:11 PM      To: [address]  CALLAGHAN ARA BU:[id number] [birth year]  PROCEDURE: 1. Right common femoral artery cannulation under ultrasound guidance 2. Placement of catheter in aorta 3. Aortogram with carbon dioxide 4. Second order arterial selection 5. Left leg runoff with carbon dioxide and IV contrast 6. Subintimal angioplasty and stenting left superficial femoral artery (Viabahn 5 mm x 150 mm)  Follow-up: 4 weeks

## 2014-11-16 NOTE — Transfer of Care (Signed)
Immediate Anesthesia Transfer of Care Note  Patient: [name]  Procedure(s) Performed: Procedure(s):  DEBRIDEMENT OF LEFT FOOT POSSIBLE APPLICATION OF INTEGRIA AND VAC  (Left)  Patient Location: PACU  Anesthesia Type:General  Level of Consciousness: awake, alert  and oriented  Airway & Oxygen Therapy: Patient Spontanous Breathing and Patient connected to nasal cannula oxygen  Post-op Assessment: Report given to RN, Post -op Vital signs reviewed and stable and Patient moving all extremities  Post vital signs: Reviewed and stable  Last Vitals:  Filed Vitals:   11/16/14 1513  BP: 111/53  Pulse: 98  Temp: 37.1 C  Resp: 15    Complications: No apparent anesthesia complications

## 2014-11-16 NOTE — Op Note (Signed)
Operative Note   DATE OF OPERATION:11.15.2016  LOCATION: [place] [facility] OR  SURGICAL DIVISION: Plastic Surgery  PREOPERATIVE DIAGNOSES:  1. Diabetes mellitus with gangrene left foot 2. Peripheral vascular disease with foot ulcer 3. Pressue ulcer un stageable left heel 4. Peripheral neuropathy  POSTOPERATIVE DIAGNOSES:  Same 3, Stage 3 pressure ulcer left heel  PROCEDURE:  1. Excisional debridement left foot skin, subcutaneous tissue, fascia, muscle, tendon : dorsum foot 13 x 9 x1cm 2. Excisional debridement skin subcutaneous tissue left heel 4 x 6 x 0.5 cm measured as cluster  SURGEON: Irene Limbo MD MBA  ASSISTANT: none  ANESTHESIA:  General.   EBL: 25 ml  COMPLICATIONS: None immediate.   INDICATIONS FOR PROCEDURE:  The patient, [name], is a 49 y.o. female born on 08-08-1965, is here for debridement left foot ulcer present for several months and presented to hospital with gross necrosis soft tissue. MRI with no evidence osteomyelitis of bone. Patient underwent stenting of left SFA day prior to debridement for critical ischemia.   FINDINGS: Full thickness necrosis skin and soft tissue over dorsum left foot. Necrosis included resection tibialis anterior tendon at ankle, debridement all extensor hallicus brevis and extensor digitorum brevis muscles. Within wound remains EDL and EHL tendons with questional viability. In the setting of poor mobility, severe neuropathy patient has developed additional pressure sore left lateral heel stage 3.   DESCRIPTION OF PROCEDURE:  The patient's operative site was marked with the patient in the preoperative area. The patient was taken to the operating room. SCDs were placed over right leg. Patient has been on IV antibiotics during hospital stay and no further antibiotics were gived. The patient's operative site was prepped and draped in a sterile fashion. A time out was performed and all information was confirmed to be correct. Sharp excision of  left foot eschar completed. This represented full thickness skin necrosis. Underlying subcutaneous fat also necrotic and liquefied. Extensor tendons exposed as noted above. Tibialis anterior tendon brown and excised at proximal edge of wound at ankle. Intrinsic extensor foot muscles pale and ischemic and sharp excision with scissors of extensor hallicus and brevis muscles. Extensor hallicus longus and Extensor digitorum longus tendons remain exposed in wound with questionable viability. Wound irrigated and hemostasis obtained.  I then directed attention to heel ulcer. Tangential debridement eschar and slough completed to subcutaneous fat.   Adaptic applied to dorsal foot wound followed by VAC sponge and set to 125 mm Hg continuous. Adaptic and dry dressing applied to heel wound.  The patient was allowed to wake from anesthesia, extubated and taken to the recovery room in satisfactory condition.   SPECIMENS: none  DRAINS: none  Irene Limbo, MD [facility] [phone]

## 2014-11-16 NOTE — Anesthesia Postprocedure Evaluation (Signed)
  Anesthesia Post-op Note  Patient: [name]  Procedure(s) Performed: Procedure(s):  DEBRIDEMENT OF LEFT FOOT POSSIBLE APPLICATION OF INTEGRIA AND VAC  (Left)  Patient Location: PACU  Anesthesia Type:General  Level of Consciousness: awake and alert   Airway and Oxygen Therapy: Patient Spontanous Breathing  Post-op Pain: Controlled  Post-op Assessment: Post-op Vital signs reviewed, Patient's Cardiovascular Status Stable and Respiratory Function Stable  Post-op Vital Signs: Reviewed  Filed Vitals:   11/16/14 1550  BP:   Pulse:   Temp: 37.1 C  Resp:     Complications: No apparent anesthesia complications

## 2014-11-16 NOTE — Progress Notes (Signed)
   Daily Progress Note  Assessment/Planning: POD #1 s/p SIA+S L SFA   Nothing more to optimize from a vascular viewpoint.  ABI pending  Recheck BMP tomorrow for any CIN (doubt it as only 30 cc of IV contrast given)  Follow up in office for surveillance on the stent in 3 months  Subjective  - 1 Day Post-Op  No significant changes in Left foot (no pain)  Objective Filed Vitals:   11/15/14 2349 11/16/14 0145 11/16/14 0812 11/16/14 0936  BP: 67/31 122/70  115/48  Pulse: 83 74    Temp: 98.3 F (36.8 C) 97.7 F (36.5 C) 97.9 F (36.6 C)   TempSrc: Oral Oral Oral   Resp: 12 13    Height:      Weight:  240 lb (108.863 kg)    SpO2: 98% 95%      Intake/Output Summary (Last 24 hours) at 11/16/14 0942 Last data filed at 11/16/14 0500  Gross per 24 hour  Intake 2841.7 ml  Output   2600 ml  Net  241.7 ml    VASC  R groin with bandage without obvious hematoma , L foot bandaged, pink foot  Laboratory CBC    Component Value Date/Time   WBC 10.8* 11/15/2014 1714   HGB 9.1* 11/15/2014 1714   HCT 28.5* 11/15/2014 1714   PLT 536* 11/15/2014 1714    BMET    Component Value Date/Time   NA 137 11/15/2014 0350   K 4.2 11/15/2014 0350   CL 100* 11/15/2014 0350   CO2 28 11/15/2014 0350   GLUCOSE 125* 11/15/2014 0350   BUN 27* 11/15/2014 0350   CREATININE 1.69* 11/15/2014 1714   CREATININE 1.81* 09/28/2014 1536   CALCIUM 9.1 11/15/2014 0350   GFRNONAA 34* 11/15/2014 1714   GFRNONAA 39* 12/09/2013 1533   GFRAA 40* 11/15/2014 1714   GFRAA 45* 12/09/2013 1533    Adele Barthel, MD [facility]: [phone] Pager: [phone]  11/16/2014, 9:42 AM

## 2014-11-16 NOTE — Anesthesia Preprocedure Evaluation (Addendum)
Anesthesia Evaluation  Patient identified by MRN, date of birth, ID band Patient awake    Reviewed: Allergy & Precautions, NPO status , reviewed documented beta blocker date and time   Airway Mallampati: III   Neck ROM: Full  Mouth opening: Limited Mouth Opening  Dental  (+) Teeth Intact, Dental Advisory Given   Pulmonary shortness of breath, Current Smoker,    breath sounds clear to auscultation       Cardiovascular hypertension, Pt. on medications  Rhythm:Regular  ECHO EF 65% 05/2014   Neuro/Psych Anxiety Depression    GI/Hepatic GERD  Medicated,  Endo/Other  diabetes, Type 2, Insulin Dependent  Renal/GU Renal InsufficiencyRenal diseaseCreat 2.2     Musculoskeletal  (+) Arthritis ,   Abdominal (+) + obese,   Peds  Hematology  (+) anemia , HIV, 9/27   Anesthesia Other Findings   Reproductive/Obstetrics                            Anesthesia Physical  Anesthesia Plan  ASA: III  Anesthesia Plan: General   Post-op Pain Management:    Induction: Intravenous  Airway Management Planned: Oral ETT  Additional Equipment:   Intra-op Plan:   Post-operative Plan: Extubation in OR  Informed Consent: I have reviewed the patients History and Physical, chart, labs and discussed the procedure including the risks, benefits and alternatives for the proposed anesthesia with the patient or authorized representative who has indicated his/her understanding and acceptance.   Dental advisory given  Plan Discussed with: CRNA  Anesthesia Plan Comments:       Anesthesia Quick Evaluation

## 2014-11-16 NOTE — Progress Notes (Signed)
Hypoglycemic Event  CBG: 60, immediately rechecked at 58  Treatment: D50 IV 25 mL  Symptoms: Sweaty  Follow-up CBG: Time:0705 CBG Result:113  Possible Reasons for Event: Unknown  Comments/MD notified: Text paged Dr Candiss Norse with [facility] with all information pertaining to this event.    [name]

## 2014-11-16 NOTE — Progress Notes (Signed)
   Plastic Surgery  Please see op note for details. Communicated with mother  Details of surgery. Counseled that the loss of AT tendon, intrinsic muscles will limit movement foot and ankle. The EHL and EDC tendons are of questionable viablity and she will have severely reduced ability to raise foot/dorsiflex. In addition has new pressure ulcer heel. The dorsal wound in now two exposed tendons over bone. I recommend amputation as the prior consultants have. If she declines this, will require prolonged wound care and likely multiple surgical interventions.   I will speak again with patient in am. If patient declines amputation will need VAC change at bedside later this week. Silver alginate dressing changes written for heel ulcer.   Irene Limbo, MD [facility] Plastic & Reconstructive Surgery [phone]

## 2014-11-16 NOTE — Care Management Important Message (Signed)
Important Message  Patient Details  Name: [name] MRN: [id number] Date of Birth: Sep 02, 1965   Medicare Important Message Given:  Yes    Rakeem Colley Abena 11/16/2014, 1:07 PM

## 2014-11-17 ENCOUNTER — Inpatient Hospital Stay (HOSPITAL_COMMUNITY): Payer: Medicare HMO

## 2014-11-17 ENCOUNTER — Encounter (HOSPITAL_COMMUNITY): Payer: Self-pay | Admitting: Plastic Surgery

## 2014-11-17 LAB — BASIC METABOLIC PANEL
Anion gap: 9 (ref 5–15)
BUN: 14 mg/dL (ref 6–20)
CALCIUM: 8.8 mg/dL — AB (ref 8.9–10.3)
CO2: 24 mmol/L (ref 22–32)
CREATININE: 1.62 mg/dL — AB (ref 0.44–1.00)
Chloride: 105 mmol/L (ref 101–111)
GFR calc non Af Amer: 36 mL/min — ABNORMAL LOW (ref >60)
GFR, EST AFRICAN AMERICAN: 42 mL/min — AB (ref >60)
Glucose, Bld: 132 mg/dL — ABNORMAL HIGH (ref 65–99)
Potassium: 3.7 mmol/L (ref 3.5–5.1)
SODIUM: 138 mmol/L (ref 135–145)

## 2014-11-17 LAB — CBC
HCT: 26.7 % — ABNORMAL LOW (ref 36.0–46.0)
Hemoglobin: 8.1 g/dL — ABNORMAL LOW (ref 12.0–15.0)
MCH: 26 pg (ref 26.0–34.0)
MCHC: 30.3 g/dL (ref 30.0–36.0)
MCV: 85.9 fL (ref 78.0–100.0)
PLATELETS: 471 10*3/uL — AB (ref 150–400)
RBC: 3.11 MIL/uL — AB (ref 3.87–5.11)
RDW: 17.9 % — AB (ref 11.5–15.5)
WBC: 9.1 10*3/uL (ref 4.0–10.5)

## 2014-11-17 LAB — GLUCOSE, CAPILLARY
GLUCOSE-CAPILLARY: 53 mg/dL — AB (ref 65–99)
GLUCOSE-CAPILLARY: 174 mg/dL — AB (ref 65–99)
GLUCOSE-CAPILLARY: 167 mg/dL — AB (ref 65–99)
GLUCOSE-CAPILLARY: 233 mg/dL — AB (ref 65–99)
Glucose-Capillary: 143 mg/dL — ABNORMAL HIGH (ref 65–99)
Glucose-Capillary: 169 mg/dL — ABNORMAL HIGH (ref 65–99)

## 2014-11-17 LAB — PREPARE RBC (CROSSMATCH)

## 2014-11-17 LAB — ABO/RH: ABO/RH(D): O NEG

## 2014-11-17 LAB — MAGNESIUM: MAGNESIUM: 1.9 mg/dL (ref 1.7–2.4)

## 2014-11-17 MED ORDER — SODIUM CHLORIDE 0.9 % IV SOLN
Freq: Once | INTRAVENOUS | Status: AC
  Administered 2014-11-17: 22:00:00 via INTRAVENOUS

## 2014-11-17 NOTE — Progress Notes (Signed)
Patient Demographics:    [name], is a 49 y.o. female, DOB - 1965/08/21, [id number]  Admit date - 11/09/2014   Admitting Physician Florencia Reasons, MD  Outpatient Primary MD for the patient is [name]  LOS - 8  Brief summary  Patient [facility] for nonhealing left foot ulcer, patient was started on vancomycin and Fortaz, orthopedic was consulted and currently we're following, MRI shows anaerobic cellulitis with probable subcutaneous emphysema. Orthopedic recommends plastic surgery consult, plastic surgery with attempted debridement but requested vascular surgery consultation.  She was subsequently transferred on 11/16/2014 to [facility] for left foot wound debridement and wound VAC placement by plastic surgery and for a possible angiogram by vascular surgery.  Chief Complaint  Patient presents with  . Wound Infection        Subjective:    [name] today has, No headache, No chest pain, No abdominal pain - No Nausea, No new weakness tingling or numbness, No Cough - SOB. Left foot pain has improved.   Assessment  & Plan :     1. Diabetic left foot infection and gangrene with underlying PAD - MRI shows no evidence of osteomyelitis however clinically there is cellulitis, currently on vancomycin and Zosyn, orthopedic surgery, plastic surgery and vascular surgery on board.   Patient to undergo left foot wound debridement with wound VAC placement on 11/16/2014, had L Leg L SFA stent 11-14-14 by Dr Bridgett Larsson on 11-14-14, now recommendations from both vascular surgeon and plastic surgeon seemed to be left BKA. Patient was initially resistant but now has agreed. We'll inform orthopedic surgery about the same. She prefers Dr. Ninfa Linden.  She will be a  moderate risk candidate for any adverse cardio pulmonary outcome during the perioperative period, this was explained to the patient clearly she accepts the risks and wants to proceed with the procedure.    2. Chronic hypotension worsened by dehydration and possible sepsis early during the admission. Antihypertensive medications and Lasix have been held, blood pressure has stabilized after IV fluids will monitor. TSH and cortisol are stable.   3. HIV. Follows with Dr. Johnnye Sima. CD4 count 570 in late October 2016. Continue home medications for now.   4. Dyslipidemia. On statin continue.   5. Depression. Continue Abilify and Lexapro. Klonopin as needed for anxiety.   6. CK D4 Baseline creatinine is around 1.9. Monitor.    7.Chronic diastolic dysfunction with EF 60% on this admission. Compensated.   8. DM type II. Currently on Lantus and sliding scale dose adjusted, continue CBG monitoring.  Lab Results  Component Value Date   HGBA1C 10.7* 11/10/2014    CBG (last 3)   Recent Labs  11/16/14 2224 11/17/14 0745 11/17/14 0825  GLUCAP 228* 15* 143*       Code Status : Full  Family Communication  : None present  Disposition Plan  : Remain inpatient for now  Consults  :    Orthopedics Plastic surgery  Vascular surgery  Procedures  :   TTE - chronic grade 1 diastolic dysfunction, EF 123456.  ABI left foot severe disease with ABI of 0.53, right leg mild arterial disease with ABI of 0.91.  L Leg L SFA stent 11-14-14 by Dr  Chen  L foot wound debridement and wound VAC placement on 11/16/2014 by plastic surgery  Likely will get left BKA soon  DVT Prophylaxis  : Heparin   Lab Results  Component Value Date   PLT 471* 11/17/2014    Inpatient Medications  Scheduled Meds: . ARIPiprazole  10 mg Oral Daily  . aspirin  81 mg Oral Daily  . budesonide-formoterol  2 puff Inhalation BID  . clopidogrel  75 mg Oral Daily  . emtricitabine-tenofovir AF  1 tablet Oral Q  breakfast   And  . rilpivirine  25 mg Oral Q breakfast  . escitalopram  20 mg Oral Daily  . feeding supplement (GLUCERNA SHAKE)  237 mL Oral TID BM  . heparin subcutaneous  5,000 Units Subcutaneous 3 times per day  . insulin aspart  0-5 Units Subcutaneous QHS  . insulin aspart  0-9 Units Subcutaneous TID WC  . insulin glargine  35 Units Subcutaneous QPC supper  . methylphenidate  20 mg Oral Daily  . piperacillin-tazobactam (ZOSYN)  IV  3.375 g Intravenous Q8H  . pregabalin  150 mg Oral BID  . rosuvastatin  20 mg Oral Daily  . saccharomyces boulardii  250 mg Oral BID  . vancomycin  1,000 mg Intravenous Q24H   Continuous Infusions: . lactated ringers 10 mL/hr at 11/16/14 1317   PRN Meds:.acetaminophen, clonazePAM, cyclobenzaprine, HYDROcodone-acetaminophen, ipratropium-albuterol, loperamide, morphine injection, ondansetron (ZOFRAN) IV, oxyCODONE-acetaminophen, traMADol  Antibiotics  :     Anti-infectives    Start     Dose/Rate Route Frequency Ordered Stop   11/12/14 2200  vancomycin (VANCOCIN) IVPB 1000 mg/200 mL premix     1,000 mg 200 mL/hr over 60 Minutes Intravenous Every 24 hours 11/12/14 1956     11/10/14 2000  vancomycin (VANCOCIN) 1,250 mg in sodium chloride 0.9 % 250 mL IVPB  Status:  Discontinued     1,250 mg 166.7 mL/hr over 90 Minutes Intravenous Every 24 hours 11/10/14 0212 11/12/14 1954   11/10/14 0800  emtricitabine-rilpivir-tenofovir AF (ODEFSEY) 200-25-25 MG per tablet 1 tablet  Status:  Discontinued     1 tablet Oral Daily with breakfast 11/09/14 1837 11/10/14 0144   11/10/14 0800  emtricitabine-tenofovir AF (DESCOVY) 200-25 MG per tablet 1 tablet     1 tablet Oral Daily with breakfast 11/10/14 0144     11/10/14 0800  rilpivirine (EDURANT) tablet 25 mg     25 mg Oral Daily with breakfast 11/10/14 0144     11/10/14 0215  piperacillin-tazobactam (ZOSYN) IVPB 3.375 g     3.375 g 12.5 mL/hr over 240 Minutes Intravenous Every 8 hours 11/10/14 0208     11/10/14 0215   vancomycin (VANCOCIN) IVPB 1000 mg/200 mL premix     1,000 mg 200 mL/hr over 60 Minutes Intravenous  Once 11/10/14 0209 11/10/14 0441   11/09/14 1545  vancomycin (VANCOCIN) IVPB 1000 mg/200 mL premix     1,000 mg 200 mL/hr over 60 Minutes Intravenous  Once 11/09/14 1530 11/09/14 1855   11/09/14 1530  piperacillin-tazobactam (ZOSYN) IVPB 3.375 g     3.375 g 12.5 mL/hr over 240 Minutes Intravenous  Once 11/09/14 1530 11/09/14 1758        Objective:   Filed Vitals:   11/17/14 0000 11/17/14 0400 11/17/14 0641 11/17/14 0700  BP: 102/60 116/65    Pulse: 84 79    Temp: 98.3 F (36.8 C) 98.7 F (37.1 C)    TempSrc: Oral Oral    Resp: 17 15    Height:  Weight:   110.315 kg (243 lb 3.2 oz)   SpO2: 94% 95%  95%    Wt Readings from Last 3 Encounters:  11/17/14 110.315 kg (243 lb 3.2 oz)  11/09/14 110.224 kg (243 lb)  07/08/14 110.224 kg (243 lb)     Intake/Output Summary (Last 24 hours) at 11/17/14 1107 Last data filed at 11/17/14 0630  Gross per 24 hour  Intake   1900 ml  Output   2100 ml  Net   -200 ml     Physical Exam  Awake Alert, Oriented X 3, No new F.N deficits, Normal affect [place].AT,PERRAL Supple Neck,No JVD, No cervical lymphadenopathy appriciated.  Symmetrical Chest wall movement, Good air movement bilaterally, CTAB RRR,No Gallops,Rubs or new Murmurs, No Parasternal Heave +ve B.Sounds, Abd Soft, No tenderness, No organomegaly appriciated, No rebound - guarding or rigidity. No Cyanosis, Clubbing or edema, No new Rash or bruise L foot under bandage mild surrounding erythema    Data Review:   Micro Results Recent Results (from the past 240 hour(s))  MRSA PCR Screening     Status: None   Collection Time: 11/10/14  2:22 AM  Result Value Ref Range Status   MRSA by PCR NEGATIVE NEGATIVE Final    Comment:        The GeneXpert MRSA Assay (FDA approved for NASAL specimens only), is one component of a comprehensive MRSA colonization surveillance program. It  is not intended to diagnose MRSA infection nor to guide or monitor treatment for MRSA infections.   Wound culture     Status: None   Collection Time: 11/10/14  2:22 AM  Result Value Ref Range Status   Specimen Description WOUND L FOOT  Final   Special Requests NONE  Final   Gram Stain   Final    NO WBC SEEN NO SQUAMOUS EPITHELIAL CELLS SEEN ABUNDANT GRAM POSITIVE COCCI IN PAIRS IN CLUSTERS ABUNDANT GRAM NEGATIVE RODS Performed at [facility]    Culture   Final    MULTIPLE ORGANISMS PRESENT, NONE PREDOMINANT Note: NO STAPHYLOCOCCUS AUREUS ISOLATED NO GROUP A STREP (S.PYOGENES) ISOLATED Performed at [facility]    Report Status 11/12/2014 FINAL  Final  MRSA PCR Screening     Status: None   Collection Time: 11/12/14 12:24 PM  Result Value Ref Range Status   MRSA by PCR NEGATIVE NEGATIVE Final    Comment:        The GeneXpert MRSA Assay (FDA approved for NASAL specimens only), is one component of a comprehensive MRSA colonization surveillance program. It is not intended to diagnose MRSA infection nor to guide or monitor treatment for MRSA infections.   C difficile quick scan w PCR reflex     Status: None   Collection Time: 11/14/14  2:00 PM  Result Value Ref Range Status   C Diff antigen NEGATIVE NEGATIVE Final   C Diff toxin NEGATIVE NEGATIVE Final   C Diff interpretation Negative for toxigenic C. difficile  Final    Radiology Reports Mr Foot Left Wo Contrast  11/10/2014  CLINICAL DATA:  Diabetic foot ulcer.  Redness and swelling. EXAM: MRI OF THE LEFT FOREFOOT WITHOUT CONTRAST TECHNIQUE: Multiplanar, multisequence MR imaging was performed. No intravenous contrast was administered. COMPARISON:  Radiographs dated 11/09/2014 FINDINGS: There is extensive subcutaneous gas on the dorsum of the midfoot. No discrete abscesses. There is no bone destruction in the midfoot although there is edema in the cuneiforms, cuboid, and navicular as well as slight edema in  the bases  of the metatarsals. There is a chronic Freiberg's infraction of the head of the second metatarsal, avascular necrosis. There is small ankle joint effusion. Visualized muscles and tendons are normal. IMPRESSION: Findings consistent with anaerobic cellulitis of the dorsum of the foot. The edema in the bones of the midfoot are most likely stress reactions with secondary to diabetic neuropathy. The findings are not consistent with avascular necrosis in the midfoot or osteomyelitis. Electronically Signed   By: Lorriane Shire M.D.   On: 11/10/2014 07:37   Dg Chest Port 1 View  11/16/2014  CLINICAL DATA:  Hypoglycemia this morning. Cough. Initial encounter. EXAM: PORTABLE CHEST 1 VIEW COMPARISON:  Single view of the chest 07/08/2014 and 11/12/2014. FINDINGS: The lungs are clear. Heart size is normal. No pneumothorax or pleural effusion. Remote right rib fractures are noted. IMPRESSION: No acute disease. Electronically Signed   By: Inge Rise M.D.   On: 11/16/2014 08:00   Dg Chest Port 1 View  11/12/2014  CLINICAL DATA:  Shortness of Breath EXAM: PORTABLE CHEST 1 VIEW COMPARISON:  July 08, 2014 FINDINGS: There is no edema or consolidation. Heart is mildly enlarged with pulmonary vascularity within normal limits. No adenopathy. There are healed rib fractures bilaterally. IMPRESSION: No edema or consolidation.  Stable cardiac prominence. Electronically Signed   By: Lowella Grip III M.D.   On: 11/12/2014 08:11   Dg Foot 2 Views Left  11/09/2014  CLINICAL DATA:  Diabetic foot ulcer on the dorsum of the foot and ankle. EXAM: LEFT FOOT - 2 VIEW COMPARISON:  None. FINDINGS: There is an open wound on the dorsum of the midfoot with extensive subcutaneous emphysema. No obvious destructive bony changes to suggest osteomyelitis. No destructive joint changes to suggest septic arthritis. There is chronic appearing AVN of the second metatarsal head (Freiberg's infraction). IMPRESSION: 1. Open wound on the  dorsum of the midfoot with subcutaneous emphysema. 2. No definite plain film findings to suggest septic arthritis or osteomyelitis 3. Chronic AVN of the second metatarsal head. Electronically Signed   By: Marijo Sanes M.D.   On: 11/09/2014 16:20     CBC  Recent Labs Lab 11/12/14 0547 11/13/14 0420 11/13/14 1157 11/14/14 0340 11/15/14 0350 11/15/14 1714 11/16/14 1140 11/17/14 0350  WBC 13.0* 11.8* 11.3* 12.3* 12.9* 10.8* 10.1 9.1  HGB 9.1* 7.9* 8.5* 8.6* 8.1* 9.1* 8.5* 8.1*  HCT 29.7* 25.9* 27.4* 27.6* 26.2* 28.5* 26.6* 26.7*  PLT 489* 459* 459* 478* 582* 536* PLATELET CLUMPS NOTED ON SMEAR, COUNT APPEARS ADEQUATE 471*  MCV 85.6 84.6 86.2 85.7 84.5 82.6 84.2 85.9  MCH 26.2 25.8* 26.7 26.7 26.1 26.4 26.9 26.0  MCHC 30.6 30.5 31.0 31.2 30.9 31.9 32.0 30.3  RDW 17.6* 17.5* 17.6* 17.6* 17.4* 17.5* 17.6* 17.9*  LYMPHSABS 1.7 1.8 1.8 1.9 2.2  --   --   --   MONOABS 0.8 0.8 0.7 0.7 0.7  --   --   --   EOSABS 0.3 0.4 0.5 0.4 0.4  --   --   --   BASOSABS 0.0 0.0 0.0 0.0 0.0  --   --   --     Chemistries   Recent Labs Lab 11/12/14 0547  11/13/14 0420 11/14/14 0340 11/15/14 0350 11/15/14 1714 11/16/14 0835 11/17/14 0350  NA 133*  < > 131* 133* 137  --  141 138  K 4.3  < > 3.8 4.2 4.2  --  3.4* 3.7  CL 97*  < > 99* 99* 100*  --  109  105  CO2 27  < > 26 26 28   --  25 24  GLUCOSE 228*  < > 195* 220* 125*  --  70 132*  BUN 34*  < > 27* 27* 27*  --  19 14  CREATININE 2.04*  < > 1.71* 1.71* 1.74* 1.69* 1.64* 1.62*  CALCIUM 9.1  < > 8.7* 9.0 9.1  --  8.9 8.8*  MG  --   --   --   --   --   --  1.9 1.9  AST 11*  --  10* 11* 12*  --  17  --   ALT 9*  --  9* 7* 9*  --  9*  --   ALKPHOS 79  --  80 72 80  --  78  --   BILITOT 0.4  --  0.6 0.4 0.8  --  0.2*  --   < > = values in this interval not displayed. ------------------------------------------------------------------------------------------------------------------ estimated creatinine clearance is 55.9 mL/min (by C-G formula based  on Cr of 1.62). ------------------------------------------------------------------------------------------------------------------ No results for input(s): HGBA1C in the last 72 hours. ------------------------------------------------------------------------------------------------------------------ No results for input(s): CHOL, HDL, LDLCALC, TRIG, CHOLHDL, LDLDIRECT in the last 72 hours. ------------------------------------------------------------------------------------------------------------------  Recent Labs  11/16/14 0835  TSH 2.358   ------------------------------------------------------------------------------------------------------------------ No results for input(s): VITAMINB12, FOLATE, FERRITIN, TIBC, IRON, RETICCTPCT in the last 72 hours.  Coagulation profile  Recent Labs Lab 11/15/14 0350  INR 1.18    No results for input(s): DDIMER in the last 72 hours.  Cardiac Enzymes No results for input(s): CKMB, TROPONINI, MYOGLOBIN in the last 168 hours.  Invalid input(s): CK ------------------------------------------------------------------------------------------------------------------ Invalid input(s): POCBNP   Time Spent in minutes  35   Sway Guttierrez K M.D on 11/17/2014 at 11:07 AM  Between 7am to 7pm - Pager - [phone]  After 7pm go to [email or web address] - password [email or web address]  [facility] -  Office  [phone]

## 2014-11-17 NOTE — Progress Notes (Signed)
   POD #1 debridement left foot  Temp:  [97.9 F (36.6 C)-98.7 F (37.1 C)] 98.7 F (37.1 C) (11/16 0400) Pulse Rate:  [66-98] 79 (11/16 0400) Resp:  [14-19] 15 (11/16 0400) BP: (102-123)/(48-65) 116/65 mmHg (11/16 0400) SpO2:  [94 %-99 %] 95 % (11/16 0400) Weight:  [110.315 kg (243 lb 3.2 oz)] 110.315 kg (243 lb 3.2 oz) (11/16 0641)   Dressings dry  Intact Reviewed intraop findings with patient.  She is agreeable to amputation at this time. She requests Dr. Ninfa Linden to see her as he performed amputation on her mother. Will try to facilitate this. Pending this surgery, may need VAC change at bedside prior.  Irene Limbo, MD [facility] Plastic & Reconstructive Surgery [phone]

## 2014-11-17 NOTE — Progress Notes (Signed)
Patient in her bed visiting with her mother and a friend and became weepy and sad during my assessment, addressed pain with medication and just sat and talked with her and her mother regarding her concerns and that she will be able to lead a full life with some rehab and that she has a great support system. Patient began to smile at this time, will continue to assess and encourage her to relay her concerns and fears.

## 2014-11-17 NOTE — Care Management Note (Signed)
Case Management Note  Patient Details  Name: [name] MRN: [id number] Date of Birth: November 21, 1965  Subjective/Objective:     Pt is a transfer from [facility] for Left foot debridement and VAC placement on 11-16-14.                Action/Plan: Pt is agreeable to L BKA- plan for surgery Thursday vs Friday. Pt will benefit from SNF vs CIR once stable. CM will continue to monitor for disposition needs.     Expected Discharge Date:  11/15/14               Expected Discharge Plan:  Skilled Nursing Facility  In-House Referral:  Clinical Social Work  Discharge planning Services  CM Consult  Post Acute Care Choice:  NA Choice offered to:  NA  DME Arranged:    DME Agency:     HH Arranged:    HH Agency:     Status of Service:  In process, will continue to follow  Medicare Important Message Given:  Yes Date Medicare IM Given:    Medicare IM give by:    Date Additional Medicare IM Given:    Additional Medicare Important Message give by:     If discussed at [place] of Stay Meetings, dates discussed:    Additional Comments:  Bethena Roys, RN 11/17/2014, 2:40 PM

## 2014-11-17 NOTE — Progress Notes (Signed)
   Daily Progress Note  Outcome of L foot debridement noted.  Pt proceeding with L BKA.  Available as needed.  Pt should follow with me in my clinic [phone]) in 4 weeks for post-procedure evaluation and setup of surveillance as she now has a covered stent providing perfusion to the future L BKA stump.    Adele Barthel, MD [facility] Office: [phone] Pager: [phone]  11/17/2014, 9:17 AM

## 2014-11-17 NOTE — Consult Note (Addendum)
ORTHOPAEDIC CONSULTATION  REQUESTING PHYSICIAN: Thurnell Lose, MD  Chief Complaint: Left foot nonhealing wounds  HPI: [name] is a 49 y.o. female who presents with left foot nonhealing wounds of several weeks.  It started with her heel skin peeling off and development of large blisters on the dorsum of her foot.  This has progressively gotten worse.  Her mother has been applying silvadene to this area but has continued to worsen.  She endorses severe pain of the left foot.  Pain does not radiate, it's worse with touch and better with rest and elevation.  It's a searing pain.  Denies any f/c/n/v.  She has undergone multiple I&D's by plastics and limb salvage is no longer felt to be an option.  Ortho consulted.    Past Medical History  Diagnosis Date  . Anxiety   . Depression   . Hypertension   . HIV infection ([place]) 2000  . Asthma     HOSPITALIZED WITH EXCERBATION OF ASTHMA - AND BRONCHITIS AND THE FLU DEC 2013  . Shortness of breath     ALLERGIES ARE "ACTING UP"  . Diabetes mellitus     ON INSULIN AND ORAL MEDICATIONS  . GERD (gastroesophageal reflux disease)   . Neuropathy     NEUROPATHY HANDS AND FEET  . Arthritis     LEFT HIP  . Pain     SEVERE PAIN LEFT HIP - HX OF LEFT HIP PINNING JULY 2013   Past Surgical History  Procedure Laterality Date  . Hip pinning,cannulated  07/16/2011    Procedure: CANNULATED HIP PINNING;  Surgeon: Gearlean Alf, MD;  Location: [facility];  Service: Orthopedics;  Laterality: Left;  . Hip pinning,cannulated Left 04/09/2012    Procedure: CANNULATED HIP PINNING AND HARDWARE REVISION;  Surgeon: Gearlean Alf, MD;  Location: [facility];  Service: Orthopedics;  Laterality: Left;  . Hip arthroplasty Left 05/05/2014    Procedure: ARTHROPLASTY OPEN REDUCTION INTERNAL FIXATION LEFT HIP AND REMOVAL OF TWO CANNULATED SCREW;  Surgeon: Latanya Maudlin, MD;  Location: [facility];  Service: Orthopedics;  Laterality: Left;  . Cast application Left [id number]   Procedure: CAST APPLICATION (FIBERGLASS);  Surgeon: Latanya Maudlin, MD;  Location: [facility];  Service: Orthopedics;  Laterality: Left;  CLOSED REDUCTION OF LEFT COLLES FRACTURE WITH SHORT ARM CAST  . Hardware removal Left 05/05/2014    Procedure: HARDWARE REMOVAL LEFT HIP;  Surgeon: Latanya Maudlin, MD;  Location: [facility];  Service: Orthopedics;  Laterality: Left;  REMOVAL BIOMET 6.5-8.0 CANNULATED SCREW  . Peripheral vascular catheterization N/A 11/15/2014    Procedure: Abdominal Aortogram;  Surgeon: Conrad Hamburg, MD;  Location: [facility];  Service: Cardiovascular;  Laterality: N/A;  . Peripheral vascular catheterization  11/15/2014    Procedure: Lower Extremity Angiography;  Surgeon: Conrad [place], MD;  Location: [facility];  Service: Cardiovascular;;  . Peripheral vascular catheterization Left 11/15/2014    Procedure: Peripheral Vascular Intervention;  Surgeon: Conrad Cataio, MD;  Location: [facility];  Service: Cardiovascular;  Laterality: Left;  popliteal artery stenting  . I&d extremity Left 11/16/2014    Procedure:  DEBRIDEMENT OF LEFT FOOT POSSIBLE APPLICATION OF INTEGRIA AND VAC ;  Surgeon: Irene Limbo, MD;  Location: [facility];  Service: Plastics;  Laterality: Left;   Social History   Social History  . Marital Status: Legally Separated    Spouse Name: N/A  . Number of Children: N/A  . Years of Education: N/A  Social History Main Topics  . Smoking status: Current Every Day Smoker -- 0.50 packs/day for 35 years    Types: E-cigarettes  . Smokeless tobacco: Never Used  . Alcohol Use: No     Comment: no drink since 2008  . Drug Use: No     Comment: hx of crack/cocaine, clean 8 years  . Sexual Activity: No   Other Topics Concern  . None   Social History Narrative   Family History  Problem Relation Age of Onset  . Diabetes Mother   . Hypertension Mother   . Vision loss Mother   . Heart disease Mother   . Hypertension Father   . Thyroid disease Neg Hx      - negative except otherwise stated in the family history section Allergies  Allergen Reactions  . Cortizone-10 [Hydrocortisone] Rash    Per patient, given local injection at knee and developed rash at local site.    Prior to Admission medications   Medication Sig Start Date End Date Taking? Authorizing Provider  ARIPiprazole (ABILIFY) 10 MG tablet Take 10 mg by mouth daily. 10/18/14  Yes Historical Provider, MD  atenolol (TENORMIN) 25 MG tablet Take 25 mg by mouth every morning.    Yes Historical Provider, MD  budesonide-formoterol (SYMBICORT) 80-4.5 MCG/ACT inhaler Inhale 2 puffs into the lungs 2 (two) times daily. 01/01/12  Yes Hosie Poisson, MD  Cholecalciferol (VITAMIN D3) 5000 UNITS TABS Take 5,000 Units by mouth daily.    Yes Historical Provider, MD  clonazePAM (KLONOPIN) 0.5 MG tablet Take 0.5 mg by mouth 4 (four) times daily.   Yes Historical Provider, MD  cyclobenzaprine (FLEXERIL) 10 MG tablet Take 10 mg by mouth 2 (two) times daily as needed for muscle spasms.  10/18/14  Yes Historical Provider, MD  escitalopram (LEXAPRO) 20 MG tablet Take 20 mg by mouth daily.   Yes Historical Provider, MD  eszopiclone (LUNESTA) 2 MG TABS tablet Take 3 mg by mouth at bedtime as needed for sleep. Take immediately before bedtime   Yes Historical Provider, MD  fenofibrate (TRICOR) 145 MG tablet Take 1 tablet (145 mg total) by mouth daily. 01/06/13  Yes Campbell Riches, MD  furosemide (LASIX) 40 MG tablet Take 40 mg by mouth daily. 09/02/14  Yes Historical Provider, MD  HYDROcodone-acetaminophen (NORCO) 10-325 MG per tablet Take 1 tablet by mouth every 6 (six) hours as needed. Patient taking differently: Take 1 tablet by mouth every 6 (six) hours as needed for moderate pain or severe pain.  08/20/14  Yes Campbell Riches, MD  insulin aspart (NOVOLOG) 100 UNIT/ML injection Inject 0-10 Units into the skin 2 (two) times daily. Per sliding scale:  151-200 =  2 201-250 =  3 251-300 =  4 301-350 =   6 351-400 =  8 401+      = 10 units and call provider.   Yes Historical Provider, MD  Insulin Glargine (TOUJEO SOLOSTAR) 300 UNIT/ML SOPN Inject 70 Units into the skin daily.   Yes Historical Provider, MD  methocarbamol (ROBAXIN) 500 MG tablet Take 1 tablet (500 mg total) by mouth every 6 (six) hours as needed for muscle spasms. 07/09/14  Yes Cecilie Kicks, PA-C  methylphenidate (RITALIN) 20 MG tablet Take 20 mg by mouth daily. 10/18/14  Yes Historical Provider, MD  Multiple Vitamins-Minerals (MULTIVITAMIN WITH MINERALS) tablet Take 1 tablet by mouth daily.   Yes Historical Provider, MD  ODEFSEY 200-25-25 MG TABS tablet TAKE 1 TABLET BY MOUTH DAILY WITH BREAKFAST  11/09/14  Yes Campbell Riches, MD  pantoprazole (PROTONIX) 40 MG tablet Take 40 mg by mouth daily. 09/02/14  Yes Historical Provider, MD  potassium chloride SA (K-DUR,KLOR-CON) 20 MEQ tablet Take 20 mEq by mouth daily. 09/02/14  Yes Historical Provider, MD  pregabalin (LYRICA) 225 MG capsule Take 225 mg by mouth 2 (two) times daily.   Yes Historical Provider, MD  rosuvastatin (CRESTOR) 20 MG tablet Take 20 mg by mouth daily. 09/02/14  Yes Historical Provider, MD  traMADol (ULTRAM) 50 MG tablet Take 100 mg by mouth every 6 (six) hours as needed for moderate pain or severe pain.  10/08/14  Yes Historical Provider, MD  vitamin E (VITAMIN E) 1000 UNIT capsule Take 1,000 Units by mouth daily.    Yes Historical Provider, MD  docusate sodium (COLACE) 100 MG capsule Take 1 capsule (100 mg total) by mouth 2 (two) times daily. Patient not taking: Reported on 11/08/2014 07/09/14   Cecilie Kicks, PA-C  feeding supplement, GLUCERNA SHAKE, (GLUCERNA SHAKE) LIQD Take 237 mLs by mouth 3 (three) times daily between meals. Patient not taking: Reported on 11/09/2014 05/11/14   Velvet Bathe, MD  insulin glargine (LANTUS) 100 UNIT/ML injection Inject 0.35 mLs (35 Units total) into the skin daily. Patient not taking: Reported on 11/08/2014 10/20/14   Campbell Riches, MD  Insulin Pen Needle (NOVOTWIST) 32G X 5 MM MISC Use 2 per day to inject Toujeo and Victoza 11/11/14   Elayne Snare, MD  Liraglutide (VICTOZA) 18 MG/3ML SOPN Inject 1.2 mg daily 11/11/14   Elayne Snare, MD   Dg Chest Port 1 View  11/16/2014  CLINICAL DATA:  Hypoglycemia this morning. Cough. Initial encounter. EXAM: PORTABLE CHEST 1 VIEW COMPARISON:  Single view of the chest 07/08/2014 and 11/12/2014. FINDINGS: The lungs are clear. Heart size is normal. No pneumothorax or pleural effusion. Remote right rib fractures are noted. IMPRESSION: No acute disease. Electronically Signed   By: Inge Rise M.D.   On: 11/16/2014 08:00   - pertinent xrays, CT, MRI studies were reviewed and independently interpreted  Positive ROS: All other systems have been reviewed and were otherwise negative with the exception of those mentioned in the HPI and as above.  Physical Exam: General: Alert, no acute distress Cardiovascular: No pedal edema Respiratory: No cyanosis, no use of accessory musculature GI: No organomegaly, abdomen is soft and non-tender Skin: No lesions in the area of chief complaint Neurologic: Sensation intact distally Psychiatric: Patient is competent for consent with normal mood and affect Lymphatic: No axillary or cervical lymphadenopathy  MUSCULOSKELETAL:  - large dorsal foot and ankle wound with VAC in place - heel ulcer presents with moderate drainage - no skin lesions of the lower leg  Assessment: 1. Left nonhealing foot and heel wounds s/p multiple attempts for limb salvage by [facility] 2. DM, HIV  Plan: - after a full discussion with the patient and her treatment options, I agree that patient's best option at this point is a below the knee amputation - she understands r/b/a to surgery - surgery will be tomorrow pm - NPO after midnight - informed consent obtained - plavix, heparin held - 2 unit of RBCs ordered to be transfused today, 1 unit ordered to be available  tomorrow for surgery  Thank you for the consult and the opportunity to see [name]  N. Eduard Roux, MD [place] 3:06 PM

## 2014-11-17 NOTE — Progress Notes (Signed)
Inpatient Diabetes Program Recommendations  AACE/ADA: New Consensus Statement on Inpatient Glycemic Control (2015)  Target Ranges:  Prepandial:   less than 140 mg/dL      Peak postprandial:   less than 180 mg/dL (1-2 hours)      Critically ill patients:  140 - 180 mg/dL   Review of Glycemic Control  Current orders for Inpatient glycemic control: Lantus 35 units QPM, Novolog 0-9 units TID with meals, Novolog 0-5 units HS  Inpatient Diabetes Program Recommendations: Insulin - Basal: Patient is ordered Lantus 35 units QPM. Fasting glucose was 58 mg/dl on 11/16/14 at 6:39 am and 53 mg/dl on 11/17/14 at 7:45 am. Please decrease Lantus to 30 units QPM.  Thanks, Barnie Alderman, RN, MSN, CDE Diabetes Coordinator Inpatient Diabetes Program [phone] (Team Pager from [place] to [place]) [phone] ([place] office) [phone] [facility] office) [phone] [facility] office)

## 2014-11-17 NOTE — Progress Notes (Signed)
Patient ID: [name], female   DOB: 11-Dec-1965, 49 y.o.   MRN: [id number] Ms. Gwenette Greet is well-known to me and I reviewed her chart as well as spoke with her primary team and Plastic Surgery.  She understands fully that she needs a below knee amputation due to her chronic, nonhealing left lower extremity wound.  I'm leaving town and have spoken to my partner Dr. Erlinda Hong who will come by and talk with her further about her situation.

## 2014-11-18 ENCOUNTER — Inpatient Hospital Stay (HOSPITAL_COMMUNITY): Payer: Medicare HMO | Admitting: Certified Registered Nurse Anesthetist

## 2014-11-18 ENCOUNTER — Encounter (HOSPITAL_COMMUNITY)
Admission: EM | Disposition: A | Discharge status: Rehabilitation Facility | Payer: Self-pay | Source: Home / Self Care | Attending: Internal Medicine

## 2014-11-18 HISTORY — PX: AMPUTATION: SHX166

## 2014-11-18 LAB — GLUCOSE, CAPILLARY
GLUCOSE-CAPILLARY: 95 mg/dL (ref 65–99)
GLUCOSE-CAPILLARY: 86 mg/dL (ref 65–99)
Glucose-Capillary: 105 mg/dL — ABNORMAL HIGH (ref 65–99)
Glucose-Capillary: 111 mg/dL — ABNORMAL HIGH (ref 65–99)

## 2014-11-18 LAB — POCT I-STAT 4, (NA,K, GLUC, HGB,HCT)
Glucose, Bld: 120 mg/dL — ABNORMAL HIGH (ref 65–99)
HEMATOCRIT: 36 % (ref 36.0–46.0)
HEMOGLOBIN: 12.2 g/dL (ref 12.0–15.0)
Potassium: 4.2 mmol/L (ref 3.5–5.1)
Sodium: 139 mmol/L (ref 135–145)

## 2014-11-18 SURGERY — AMPUTATION BELOW KNEE
Anesthesia: General | Site: Leg Lower | Laterality: Left

## 2014-11-18 MED ORDER — MIDAZOLAM HCL 2 MG/2ML IJ SOLN
INTRAMUSCULAR | Status: AC
  Filled 2014-11-18: qty 2

## 2014-11-18 MED ORDER — PROPOFOL 10 MG/ML IV BOLUS
INTRAVENOUS | Status: DC | PRN
  Administered 2014-11-18: 140 mg via INTRAVENOUS

## 2014-11-18 MED ORDER — FENTANYL CITRATE (PF) 100 MCG/2ML IJ SOLN
INTRAMUSCULAR | Status: AC
  Filled 2014-11-18: qty 2

## 2014-11-18 MED ORDER — GLYCOPYRROLATE 0.2 MG/ML IJ SOLN
INTRAMUSCULAR | Status: DC | PRN
  Administered 2014-11-18: .8 mg via INTRAVENOUS

## 2014-11-18 MED ORDER — LACTATED RINGERS IV SOLN
INTRAVENOUS | Status: DC | PRN
  Administered 2014-11-18: 17:00:00 via INTRAVENOUS

## 2014-11-18 MED ORDER — PROPOFOL 10 MG/ML IV BOLUS
INTRAVENOUS | Status: AC
  Filled 2014-11-18: qty 20

## 2014-11-18 MED ORDER — HYDROMORPHONE HCL 1 MG/ML IJ SOLN
1.0000 mg | INTRAMUSCULAR | Status: AC | PRN
  Administered 2014-11-18 – 2014-11-19 (×3): 1 mg via INTRAVENOUS
  Filled 2014-11-18 (×3): qty 1

## 2014-11-18 MED ORDER — FENTANYL CITRATE (PF) 100 MCG/2ML IJ SOLN
25.0000 ug | INTRAMUSCULAR | Status: DC | PRN
  Administered 2014-11-18 (×2): 50 ug via INTRAVENOUS

## 2014-11-18 MED ORDER — NEOSTIGMINE METHYLSULFATE 10 MG/10ML IV SOLN
INTRAVENOUS | Status: DC | PRN
  Administered 2014-11-18: 5 mg via INTRAVENOUS

## 2014-11-18 MED ORDER — OXYCODONE-ACETAMINOPHEN 5-325 MG PO TABS
1.0000 | ORAL_TABLET | ORAL | Status: DC | PRN
  Administered 2014-11-19 – 2014-11-21 (×7): 2 via ORAL
  Filled 2014-11-18 (×8): qty 2

## 2014-11-18 MED ORDER — INSULIN GLARGINE 100 UNIT/ML ~~LOC~~ SOLN
35.0000 [IU] | Freq: Every day | SUBCUTANEOUS | Status: DC
  Administered 2014-11-19 – 2014-11-21 (×3): 35 [IU] via SUBCUTANEOUS
  Filled 2014-11-18 (×6): qty 0.35

## 2014-11-18 MED ORDER — FENTANYL CITRATE (PF) 250 MCG/5ML IJ SOLN
INTRAMUSCULAR | Status: AC
  Filled 2014-11-18: qty 5

## 2014-11-18 MED ORDER — OXYCODONE HCL 5 MG/5ML PO SOLN: 5.0000 mg | Freq: Once | ORAL | Status: AC | PRN

## 2014-11-18 MED ORDER — FENTANYL CITRATE (PF) 100 MCG/2ML IJ SOLN
INTRAMUSCULAR | Status: DC | PRN
  Administered 2014-11-18 (×5): 50 ug via INTRAVENOUS
  Administered 2014-11-18: 100 ug via INTRAVENOUS

## 2014-11-18 MED ORDER — DEXTROSE-NACL 5-0.45 % IV SOLN
INTRAVENOUS | Status: AC
  Administered 2014-11-18: 09:00:00 via INTRAVENOUS

## 2014-11-18 MED ORDER — MIDAZOLAM HCL 5 MG/5ML IJ SOLN
INTRAMUSCULAR | Status: DC | PRN
  Administered 2014-11-18: 2 mg via INTRAVENOUS

## 2014-11-18 MED ORDER — OXYCODONE HCL 5 MG PO TABS
ORAL_TABLET | ORAL | Status: AC
  Filled 2014-11-18: qty 1

## 2014-11-18 MED ORDER — INSULIN GLARGINE 100 UNIT/ML ~~LOC~~ SOLN
20.0000 [IU] | Freq: Once | SUBCUTANEOUS | Status: DC
  Filled 2014-11-18: qty 0.2

## 2014-11-18 MED ORDER — ONDANSETRON HCL 4 MG/2ML IJ SOLN
INTRAMUSCULAR | Status: DC | PRN
  Administered 2014-11-18: 4 mg via INTRAVENOUS

## 2014-11-18 MED ORDER — LIDOCAINE HCL (CARDIAC) 20 MG/ML IV SOLN
INTRAVENOUS | Status: DC | PRN
  Administered 2014-11-18: 40 mg via INTRAVENOUS

## 2014-11-18 MED ORDER — OXYCODONE HCL 5 MG PO TABS
5.0000 mg | ORAL_TABLET | Freq: Once | ORAL | Status: AC | PRN
  Administered 2014-11-18: 5 mg via ORAL

## 2014-11-18 MED ORDER — 0.9 % SODIUM CHLORIDE (POUR BTL) OPTIME
TOPICAL | Status: DC | PRN
  Administered 2014-11-18: 1000 mL

## 2014-11-18 MED ORDER — ROCURONIUM BROMIDE 100 MG/10ML IV SOLN
INTRAVENOUS | Status: DC | PRN
  Administered 2014-11-18: 50 mg via INTRAVENOUS

## 2014-11-18 MED ORDER — SODIUM CHLORIDE 0.9 % IR SOLN
Status: DC | PRN
  Administered 2014-11-18: 3000 mL

## 2014-11-18 MED ORDER — SODIUM CHLORIDE 0.9 % IR SOLN
Status: DC | PRN
  Administered 2014-11-18: 1000 mL

## 2014-11-18 MED ORDER — INSULIN ASPART 100 UNIT/ML ~~LOC~~ SOLN
0.0000 [IU] | SUBCUTANEOUS | Status: DC
  Administered 2014-11-19: 1 [IU] via SUBCUTANEOUS

## 2014-11-18 MED ORDER — PHENYLEPHRINE HCL 10 MG/ML IJ SOLN
INTRAMUSCULAR | Status: DC | PRN
  Administered 2014-11-18: 40 ug via INTRAVENOUS

## 2014-11-18 MED ORDER — ONDANSETRON HCL 4 MG/2ML IJ SOLN: 4.0000 mg | Freq: Once | INTRAMUSCULAR | Status: DC | PRN

## 2014-11-18 SURGICAL SUPPLY — 41 items
BANDAGE ESMARK 6X9 LF (GAUZE/BANDAGES/DRESSINGS) ×1 IMPLANT
BLADE SAW RECIP 87.9 MT (BLADE) ×3 IMPLANT
BNDG COHESIVE 4X5 TAN STRL (GAUZE/BANDAGES/DRESSINGS) ×3 IMPLANT
BNDG COHESIVE 6X5 TAN STRL LF (GAUZE/BANDAGES/DRESSINGS) ×3 IMPLANT
BNDG ESMARK 6X9 LF (GAUZE/BANDAGES/DRESSINGS) ×3
BNDG GAUZE ELAST 4 BULKY (GAUZE/BANDAGES/DRESSINGS) ×3 IMPLANT
COVER SURGICAL LIGHT HANDLE (MISCELLANEOUS) ×3 IMPLANT
CUFF TOURNIQUET SINGLE 34IN LL (TOURNIQUET CUFF) ×3 IMPLANT
DRAPE EXTREMITY T 121X128X90 (DRAPE) ×3 IMPLANT
DRAPE ORTHO SPLIT 77X108 STRL (DRAPES)
DRAPE PROXIMA HALF (DRAPES) ×3 IMPLANT
DRAPE SURG ORHT 6 SPLT 77X108 (DRAPES) IMPLANT
DRAPE U-SHAPE 47X51 STRL (DRAPES) ×3 IMPLANT
DRSG PAD ABDOMINAL 8X10 ST (GAUZE/BANDAGES/DRESSINGS) ×3 IMPLANT
ELECT CAUTERY BLADE 6.4 (BLADE) ×3 IMPLANT
ELECT REM PT RETURN 9FT ADLT (ELECTROSURGICAL) ×3
ELECTRODE REM PT RTRN 9FT ADLT (ELECTROSURGICAL) ×1 IMPLANT
EVACUATOR 1/8 PVC DRAIN (DRAIN) IMPLANT
FACESHIELD WRAPAROUND (MASK) IMPLANT
GAUZE SPONGE 4X4 12PLY STRL (GAUZE/BANDAGES/DRESSINGS) ×3 IMPLANT
GAUZE XEROFORM 5X9 LF (GAUZE/BANDAGES/DRESSINGS) ×3 IMPLANT
GLOVE NEODERM STRL 7.5 LF PF (GLOVE) ×2 IMPLANT
GLOVE SURG NEODERM 7.5  LF PF (GLOVE) ×4
GOWN STRL REIN XL XLG (GOWN DISPOSABLE) ×3 IMPLANT
KIT BASIN OR (CUSTOM PROCEDURE TRAY) ×3 IMPLANT
NS IRRIG 1000ML POUR BTL (IV SOLUTION) ×3 IMPLANT
PACK GENERAL/GYN (CUSTOM PROCEDURE TRAY) ×3 IMPLANT
PAD ARMBOARD 7.5X6 YLW CONV (MISCELLANEOUS) ×3 IMPLANT
PAD CAST 4YDX4 CTTN HI CHSV (CAST SUPPLIES) ×4 IMPLANT
PADDING CAST COTTON 4X4 STRL (CAST SUPPLIES) ×8
SPONGE LAP 18X18 X RAY DECT (DISPOSABLE) IMPLANT
STAPLER VISISTAT 35W (STAPLE) IMPLANT
STOCKINETTE IMPERVIOUS LG (DRAPES) ×3 IMPLANT
SUT ETHILON 2 0 PSLX (SUTURE) ×6 IMPLANT
SUT SILK 0 TIES 10X30 (SUTURE) ×3 IMPLANT
SUT SILK 2 0 SH CR/8 (SUTURE) IMPLANT
SUT VIC AB 0 CT1 27 (SUTURE) ×8
SUT VIC AB 0 CT1 27XBRD ANBCTR (SUTURE) ×4 IMPLANT
SUT VIC AB 1 CT1 27 (SUTURE) ×4
SUT VIC AB 1 CT1 27XBRD ANBCTR (SUTURE) ×2 IMPLANT
TOWEL OR 17X26 10 PK STRL BLUE (TOWEL DISPOSABLE) ×9 IMPLANT

## 2014-11-18 NOTE — Op Note (Signed)
    Date of surgery: 11/18/2014  Preoperative diagnosis: Left foot nonhealing wounds  Postoperative diagnosis: Same  Procedure: Amputation of extremity through the tibia and fibula with primary closure.  Drains: Hemovac  Surgeon: Eduard Roux, M.D.  Anesthesia: Gen.  Estimated blood loss: [id number] cc  Complications: None  Indication for procedure: The patient presents today for the above-mentioned procedure for the above mentioned condition.  The risks, benefits, and alternatives to surgery were discussed with the patient and/or family and they wish to proceed.  Description of procedure: The patient was identified in the preoperative holding area. The patient was brought back to the operating room. The patient was placed supine on the table. General anesthesia was induced. Nonsterile tourniquet placed on the upper thigh. Timeout was performed. Preoperative antibiotics given. A fishmouth incision with a large posterior flap was used. Sharp dissection was taken down through the muscle and the fascia. The neurovascular bundles were identified and ligated with 2-0 silk suture. The tibia was osteotomized approximately 15 cm distal to the joint line. The fibula was osteotomized approximately 1-2 cm proximal to the level of the tibial osteotomy. The tourniquet was deflated. Hemostasis was obtained. The wound was thoroughly irrigated with normal saline.  The wounds closed in layer fashion using 0 vicryl for the fascia, 2-0 vicryl for the deep skin layer, 2-0 nylon for the skin. A sterile dressing was applied. Patient was extubated and transferred to the PACU in stable condition.  Disposition:  Patient will be nonweightbearing to the operative extremity. The patient will undergo prosthetic fitting once the wound is healed and the swelling has subsided.  Azucena Cecil, MD [place] 6:56 PM

## 2014-11-18 NOTE — Progress Notes (Signed)
Patient Demographics:    [name], is a 49 y.o. female, DOB - [birth year], XC:[id number]  Admit date - 11/09/2014   Admitting Physician Florencia Reasons, MD  Outpatient Primary MD for the patient is [name]  LOS - 9  Brief summary  Patient admitted on 11/09/2014 [facility] for nonhealing left foot ulcer, patient was started on vancomycin and Fortaz, orthopedic was consulted and currently we're following, MRI shows anaerobic cellulitis with probable subcutaneous emphysema. Orthopedic recommends plastic surgery consult, plastic surgery with attempted debridement but requested vascular surgery consultation.  She was subsequently transferred on 11/16/2014 to [facility] for left foot wound debridement and wound VAC placement by plastic surgery and also underwent L.SFA stent, she is now due for left BKA on 11/18/2014.    Chief Complaint  Patient presents with  . Wound Infection        Subjective:    [name] today has, No headache, No chest pain, No abdominal pain - No Nausea, No new weakness tingling or numbness, No Cough - SOB. Left foot pain has improved.   Assessment  & Plan :     1. Diabetic left foot infection and gangrene with underlying PAD - MRI shows no evidence of osteomyelitis however clinically there is cellulitis, currently on vancomycin and Zosyn, orthopedic surgery, plastic surgery and vascular surgery on board.   Patient to undergo left foot wound debridement with wound VAC placement on 11/16/2014, had L Leg L SFA stent 11-14-14 by Dr Bridgett Larsson on 11-14-14, now recommendations from both vascular surgeon and plastic surgeon seemed to be left BKA. Patient was initially resistant but now has agreed. We'll inform orthopedic surgery about the same. She prefers  Dr. Ninfa Linden.  She will be a moderate risk candidate for any adverse cardio pulmonary outcome during the perioperative period, this was explained to the patient clearly she accepts the risks and wants to proceed with the procedure.   2. Chronic hypotension worsened by dehydration and possible sepsis early during the admission. Antihypertensive medications and Lasix have been held, blood pressure has stabilized after IV fluids will monitor. TSH and cortisol are stable.   3. HIV. Follows with Dr. Johnnye Sima. CD4 count 570 in late October 2016. Continue home medications for now.   4. Dyslipidemia. On statin continue.   5. Depression. Continue Abilify and Lexapro. Klonopin as needed for anxiety.   6. CK D4 Baseline creatinine is around 1.9. Monitor.    7.Chronic diastolic dysfunction with EF 60% on this admission. Compensated.   8. DM type II. Currently on Lantus and sliding scale dose adjusted, while nothing by mouth we'll change CBGs to every 4 hours with gentle D5 drip  Lab Results  Component Value Date   HGBA1C 10.7* 11/10/2014    CBG (last 3)   Recent Labs  11/17/14 2120 11/17/14 2340 11/18/14 0736  GLUCAP 233* 169* 95       Code Status : Full  Family Communication  : None present  Disposition Plan  : CIR versus SNF in the next 1-2 days  Consults  :    Orthopedics Plastic surgery  Vascular surgery  Procedures  :   TTE - chronic grade 1 diastolic dysfunction, EF [id number].  ABI left foot severe  disease with ABI of 0.53, right leg mild arterial disease with ABI of 0.91.  L Leg L SFA stent 11-14-14 by Dr Bridgett Larsson  L foot wound debridement and wound VAC placement on 11/16/2014 by plastic surgery  Likely will get left BKA soon  DVT Prophylaxis  : Heparin   Lab Results  Component Value Date   PLT 471* 11/17/2014    Inpatient Medications  Scheduled Meds: . ARIPiprazole  10 mg Oral Daily  . budesonide-formoterol  2 puff Inhalation BID  .  emtricitabine-tenofovir AF  1 tablet Oral Q breakfast   And  . rilpivirine  25 mg Oral Q breakfast  . escitalopram  20 mg Oral Daily  . feeding supplement (GLUCERNA SHAKE)  237 mL Oral TID BM  . insulin aspart  0-9 Units Subcutaneous Q4H  . insulin glargine  20 Units Subcutaneous Once  . [START ON 11/19/2014] insulin glargine  35 Units Subcutaneous QPC supper  . methylphenidate  20 mg Oral Daily  . piperacillin-tazobactam (ZOSYN)  IV  3.375 g Intravenous Q8H  . pregabalin  150 mg Oral BID  . rosuvastatin  20 mg Oral Daily  . saccharomyces boulardii  250 mg Oral BID  . vancomycin  1,000 mg Intravenous Q24H   Continuous Infusions: . dextrose 5 % and 0.45% NaCl 75 mL/hr at 11/18/14 0851  . lactated ringers 10 mL/hr at 11/16/14 1317   PRN Meds:.acetaminophen, clonazePAM, cyclobenzaprine, HYDROcodone-acetaminophen, ipratropium-albuterol, loperamide, morphine injection, ondansetron (ZOFRAN) IV, oxyCODONE-acetaminophen, traMADol  Antibiotics  :     Anti-infectives    Start     Dose/Rate Route Frequency Ordered Stop   11/12/14 2200  vancomycin (VANCOCIN) IVPB 1000 mg/200 mL premix     1,000 mg 200 mL/hr over 60 Minutes Intravenous Every 24 hours 11/12/14 1956     11/10/14 2000  vancomycin (VANCOCIN) 1,250 mg in sodium chloride 0.9 % 250 mL IVPB  Status:  Discontinued     1,250 mg 166.7 mL/hr over 90 Minutes Intravenous Every 24 hours 11/10/14 0212 11/12/14 1954   11/10/14 0800  emtricitabine-rilpivir-tenofovir AF (ODEFSEY) 200-25-25 MG per tablet 1 tablet  Status:  Discontinued     1 tablet Oral Daily with breakfast 11/09/14 1837 11/10/14 0144   11/10/14 0800  emtricitabine-tenofovir AF (DESCOVY) 200-25 MG per tablet 1 tablet     1 tablet Oral Daily with breakfast 11/10/14 0144     11/10/14 0800  rilpivirine (EDURANT) tablet 25 mg     25 mg Oral Daily with breakfast 11/10/14 0144     11/10/14 0215  piperacillin-tazobactam (ZOSYN) IVPB 3.375 g     3.375 g 12.5 mL/hr over 240 Minutes  Intravenous Every 8 hours 11/10/14 0208     11/10/14 0215  vancomycin (VANCOCIN) IVPB 1000 mg/200 mL premix     1,000 mg 200 mL/hr over 60 Minutes Intravenous  Once 11/10/14 0209 11/10/14 0441   11/09/14 1545  vancomycin (VANCOCIN) IVPB 1000 mg/200 mL premix     1,000 mg 200 mL/hr over 60 Minutes Intravenous  Once 11/09/14 1530 11/09/14 1855   11/09/14 1530  piperacillin-tazobactam (ZOSYN) IVPB 3.375 g     3.375 g 12.5 mL/hr over 240 Minutes Intravenous  Once 11/09/14 1530 11/09/14 1758        Objective:   Filed Vitals:   11/18/14 0136 11/18/14 0240 11/18/14 0413 11/18/14 0920  BP: 98/58 100/70 97/68   Pulse:  71 75   Temp:  98.5 F (36.9 C) 98.1 F (36.7 C)   TempSrc:  Oral Oral  Resp:  20 12   Height:      Weight:   108.455 kg (239 lb 1.6 oz)   SpO2:  97% 96% 97%    Wt Readings from Last 3 Encounters:  11/18/14 108.455 kg (239 lb 1.6 oz)  11/09/14 110.224 kg (243 lb)  07/08/14 110.224 kg (243 lb)     Intake/Output Summary (Last 24 hours) at 11/18/14 1025 Last data filed at 11/18/14 0709  Gross per 24 hour  Intake   1358 ml  Output   3250 ml  Net  -1892 ml     Physical Exam  Awake Alert, Oriented X 3, No new F.N deficits, Normal affect .AT,PERRAL Supple Neck,No JVD, No cervical lymphadenopathy appriciated.  Symmetrical Chest wall movement, Good air movement bilaterally, CTAB RRR,No Gallops,Rubs or new Murmurs, No Parasternal Heave +ve B.Sounds, Abd Soft, No tenderness, No organomegaly appriciated, No rebound - guarding or rigidity. No Cyanosis, Clubbing or edema, No new Rash or bruise L foot under bandage mild surrounding erythema    Data Review:   Micro Results Recent Results (from the past 240 hour(s))  MRSA PCR Screening     Status: None   Collection Time: 11/10/14  2:22 AM  Result Value Ref Range Status   MRSA by PCR NEGATIVE NEGATIVE Final    Comment:        The GeneXpert MRSA Assay (FDA approved for NASAL specimens only), is one component  of a comprehensive MRSA colonization surveillance program. It is not intended to diagnose MRSA infection nor to guide or monitor treatment for MRSA infections.   Wound culture     Status: None   Collection Time: 11/10/14  2:22 AM  Result Value Ref Range Status   Specimen Description WOUND L FOOT  Final   Special Requests NONE  Final   Gram Stain   Final    NO WBC SEEN NO SQUAMOUS EPITHELIAL CELLS SEEN ABUNDANT GRAM POSITIVE COCCI IN PAIRS IN CLUSTERS ABUNDANT GRAM NEGATIVE RODS Performed at [facility]    Culture   Final    MULTIPLE ORGANISMS PRESENT, NONE PREDOMINANT Note: NO STAPHYLOCOCCUS AUREUS ISOLATED NO GROUP A STREP (S.PYOGENES) ISOLATED Performed at [facility]    Report Status 11/12/2014 FINAL  Final  MRSA PCR Screening     Status: None   Collection Time: 11/12/14 12:24 PM  Result Value Ref Range Status   MRSA by PCR NEGATIVE NEGATIVE Final    Comment:        The GeneXpert MRSA Assay (FDA approved for NASAL specimens only), is one component of a comprehensive MRSA colonization surveillance program. It is not intended to diagnose MRSA infection nor to guide or monitor treatment for MRSA infections.   C difficile quick scan w PCR reflex     Status: None   Collection Time: 11/14/14  2:00 PM  Result Value Ref Range Status   C Diff antigen NEGATIVE NEGATIVE Final   C Diff toxin NEGATIVE NEGATIVE Final   C Diff interpretation Negative for toxigenic C. difficile  Final    Radiology Reports Mr Foot Left Wo Contrast  11/10/2014  CLINICAL DATA:  Diabetic foot ulcer.  Redness and swelling. EXAM: MRI OF THE LEFT FOREFOOT WITHOUT CONTRAST TECHNIQUE: Multiplanar, multisequence MR imaging was performed. No intravenous contrast was administered. COMPARISON:  Radiographs dated 11/09/2014 FINDINGS: There is extensive subcutaneous gas on the dorsum of the midfoot. No discrete abscesses. There is no bone destruction in the midfoot although there is edema in  the cuneiforms,  cuboid, and navicular as well as slight edema in the bases of the metatarsals. There is a chronic Freiberg's infraction of the head of the second metatarsal, avascular necrosis. There is small ankle joint effusion. Visualized muscles and tendons are normal. IMPRESSION: Findings consistent with anaerobic cellulitis of the dorsum of the foot. The edema in the bones of the midfoot are most likely stress reactions with secondary to diabetic neuropathy. The findings are not consistent with avascular necrosis in the midfoot or osteomyelitis. Electronically Signed   By: Lorriane Shire M.D.   On: 11/10/2014 07:37   Dg Chest Port 1 View  11/16/2014  CLINICAL DATA:  Hypoglycemia this morning. Cough. Initial encounter. EXAM: PORTABLE CHEST 1 VIEW COMPARISON:  Single view of the chest 07/08/2014 and 11/12/2014. FINDINGS: The lungs are clear. Heart size is normal. No pneumothorax or pleural effusion. Remote right rib fractures are noted. IMPRESSION: No acute disease. Electronically Signed   By: Inge Rise M.D.   On: 11/16/2014 08:00   Dg Chest Port 1 View  11/12/2014  CLINICAL DATA:  Shortness of Breath EXAM: PORTABLE CHEST 1 VIEW COMPARISON:  July 08, 2014 FINDINGS: There is no edema or consolidation. Heart is mildly enlarged with pulmonary vascularity within normal limits. No adenopathy. There are healed rib fractures bilaterally. IMPRESSION: No edema or consolidation.  Stable cardiac prominence. Electronically Signed   By: Lowella Grip III M.D.   On: 11/12/2014 08:11   Dg Foot 2 Views Left  11/09/2014  CLINICAL DATA:  Diabetic foot ulcer on the dorsum of the foot and ankle. EXAM: LEFT FOOT - 2 VIEW COMPARISON:  None. FINDINGS: There is an open wound on the dorsum of the midfoot with extensive subcutaneous emphysema. No obvious destructive bony changes to suggest osteomyelitis. No destructive joint changes to suggest septic arthritis. There is chronic appearing AVN of the second metatarsal  head (Freiberg's infraction). IMPRESSION: 1. Open wound on the dorsum of the midfoot with subcutaneous emphysema. 2. No definite plain film findings to suggest septic arthritis or osteomyelitis 3. Chronic AVN of the second metatarsal head. Electronically Signed   By: Marijo Sanes M.D.   On: 11/09/2014 16:20     CBC  Recent Labs Lab 11/12/14 0547 11/13/14 0420 11/13/14 1157 11/14/14 0340 11/15/14 0350 11/15/14 1714 11/16/14 1140 11/17/14 0350  WBC 13.0* 11.8* 11.3* 12.3* 12.9* 10.8* 10.1 9.1  HGB 9.1* 7.9* 8.5* 8.6* 8.1* 9.1* 8.5* 8.1*  HCT 29.7* 25.9* 27.4* 27.6* 26.2* 28.5* 26.6* 26.7*  PLT 489* 459* 459* 478* 582* 536* PLATELET CLUMPS NOTED ON SMEAR, COUNT APPEARS ADEQUATE 471*  MCV 85.6 84.6 86.2 85.7 84.5 82.6 84.2 85.9  MCH 26.2 25.8* 26.7 26.7 26.1 26.4 26.9 26.0  MCHC 30.6 30.5 31.0 31.2 30.9 31.9 32.0 30.3  RDW 17.6* 17.5* 17.6* 17.6* 17.4* 17.5* 17.6* 17.9*  LYMPHSABS 1.7 1.8 1.8 1.9 2.2  --   --   --   MONOABS 0.8 0.8 0.7 0.7 0.7  --   --   --   EOSABS 0.3 0.4 0.5 0.4 0.4  --   --   --   BASOSABS 0.0 0.0 0.0 0.0 0.0  --   --   --     Chemistries   Recent Labs Lab 11/12/14 0547  11/13/14 0420 11/14/14 0340 11/15/14 0350 11/15/14 1714 11/16/14 0835 11/17/14 0350  NA 133*  < > 131* 133* 137  --  141 138  K 4.3  < > 3.8 4.2 4.2  --  3.4* 3.7  CL  97*  < > 99* 99* 100*  --  109 105  CO2 27  < > 26 26 28   --  25 24  GLUCOSE 228*  < > 195* 220* 125*  --  70 132*  BUN 34*  < > 27* 27* 27*  --  19 14  CREATININE 2.04*  < > 1.71* 1.71* 1.74* 1.69* 1.64* 1.62*  CALCIUM 9.1  < > 8.7* 9.0 9.1  --  8.9 8.8*  MG  --   --   --   --   --   --  1.9 1.9  AST 11*  --  10* 11* 12*  --  17  --   ALT 9*  --  9* 7* 9*  --  9*  --   ALKPHOS 79  --  80 72 80  --  78  --   BILITOT 0.4  --  0.6 0.4 0.8  --  0.2*  --   < > = values in this interval not  displayed. ------------------------------------------------------------------------------------------------------------------ estimated creatinine clearance is 55.4 mL/min (by C-G formula based on Cr of 1.62). ------------------------------------------------------------------------------------------------------------------ No results for input(s): HGBA1C in the last 72 hours. ------------------------------------------------------------------------------------------------------------------ No results for input(s): CHOL, HDL, LDLCALC, TRIG, CHOLHDL, LDLDIRECT in the last 72 hours. ------------------------------------------------------------------------------------------------------------------  Recent Labs  11/16/14 0835  TSH 2.358   ------------------------------------------------------------------------------------------------------------------ No results for input(s): VITAMINB12, FOLATE, FERRITIN, TIBC, IRON, RETICCTPCT in the last 72 hours.  Coagulation profile  Recent Labs Lab 11/15/14 0350  INR 1.18    No results for input(s): DDIMER in the last 72 hours.  Cardiac Enzymes No results for input(s): CKMB, TROPONINI, MYOGLOBIN in the last 168 hours.  Invalid input(s): CK ------------------------------------------------------------------------------------------------------------------ Invalid input(s): POCBNP   Time Spent in minutes  35   SINGH,PRASHANT K M.D on 11/18/2014 at 10:25 AM  Between 7am to 7pm - Pager - [phone]  After 7pm go to [email or web address] - password [facility]  [facility] -  Office  [phone]

## 2014-11-18 NOTE — Progress Notes (Signed)
Patient had morphine 2mg  IV at 2013 for left BKA. Patient reports pain still at a 10. Paged Baltazar Najjar NP for something stronger or PCA for pain relief. [facility] Rn

## 2014-11-18 NOTE — Progress Notes (Signed)
Pharmacy Antibiotic Time-Out Note  [name] is a 49 y.o. year-old female admitted on 11/09/2014.  The patient is currently on Vancomycin and Zosyn for diabetic foot infection and cellulitis.  Assessment/Plan: MRI shows no evidence of OM, +anaerobic cellulitis.  X-Ray shows possible subcutaneous emphysema.  Plan for BKA on 11/18. ID recommends to narrow to Unasyn once hemodynamically stable; per primary team likely to discontinue after amputation of source.  Currently pt remains in ICU-SD due to hypotension.  Renal function stable.   Hx HIV - HAART resumed.    Continue Vancomycin 1g IV q24h and Zosyn 3.375g IV q8h (infuse over 4 hours) F/u renal fxn and narrowing to Unasyn when appropriate Recheck VT at Css if warranted   Recent Labs Lab 11/14/14 0340 11/15/14 0350 11/15/14 1714 11/16/14 1140 11/17/14 0350  WBC 12.3* 12.9* 10.8* 10.1 9.1     Recent Labs Lab 11/14/14 0340 11/15/14 0350 11/15/14 1714 11/16/14 0835 11/17/14 0350  CREATININE 1.71* 1.74* 1.69* 1.64* 1.62*   Estimated Creatinine Clearance: 55.4 mL/min (by C-G formula based on Cr of 1.62). Tmax/24h 99  Antimicrobial allergies: None  Antimicrobials this admission: 11/9 >> zosyn  >> 11/9 >> vancomycin  >>   Levels/dose changes this admission: 11/11 VT at 1900 = 21 on 1250mg  q24 (trough 15-20 d/t extent of infection) --> reduce to 1g IV q24h  Microbiology Results: 11/9 wound from L foot: abundant GPC in pairs/clusters, GNR - multiple organisms none predominant 11/9 MRSA PCR: negative 11/13 CDiff antigen negative /toxin negative  Thank you for allowing pharmacy to be a part of this patient's care.  Sloan Leiter, PharmD, BCPS Clinical Pharmacist [phone]  11/18/2014, 1:26 PM

## 2014-11-18 NOTE — Transfer of Care (Signed)
Immediate Anesthesia Transfer of Care Note  Patient: [name]  Procedure(s) Performed: Procedure(s): LEFT BELOW KNEE AMPUTATION (Left)  Patient Location: PACU  Anesthesia Type:General  Level of Consciousness: awake, alert , oriented, patient cooperative and responds to stimulation  Airway & Oxygen Therapy: Patient Spontanous Breathing  Post-op Assessment: Report given to RN, Post -op Vital signs reviewed and stable and Patient moving all extremities X 4  Post vital signs: Reviewed and stable  Last Vitals:  Filed Vitals:   11/18/14 1900  BP:   Pulse:   Temp: 36.9 C  Resp:     Complications: No apparent anesthesia complications

## 2014-11-18 NOTE — Anesthesia Procedure Notes (Signed)
Procedure Name: Intubation Date/Time: 11/18/2014 5:13 PM Performed by: Tressia Miners LEFFEW Pre-anesthesia Checklist: Patient identified, Patient being monitored, Timeout performed, Emergency Drugs available and Suction available Patient Re-evaluated:Patient Re-evaluated prior to inductionOxygen Delivery Method: Circle System Utilized Preoxygenation: Pre-oxygenation with 100% oxygen Intubation Type: IV induction Ventilation: Mask ventilation without difficulty Laryngoscope Size: Mac and 4 Grade View: Grade I Tube type: Oral Tube size: 7.0 mm Number of attempts: 1 Airway Equipment and Method: Stylet Placement Confirmation: ETT inserted through vocal cords under direct vision,  positive ETCO2 and breath sounds checked- equal and bilateral Secured at: 22 cm Tube secured with: Tape Dental Injury: Teeth and Oropharynx as per pre-operative assessment

## 2014-11-18 NOTE — Anesthesia Postprocedure Evaluation (Signed)
Anesthesia Post Note  Patient: [name]  Procedure(s) Performed: Procedure(s) (LRB): LEFT BELOW KNEE AMPUTATION (Left)  Anesthesia type: general  Patient location: PACU  Post pain: Pain level controlled  Post assessment: Patient's Cardiovascular Status Stable  Last Vitals:  Filed Vitals:   11/18/14 1930  BP: 125/71  Pulse: 96  Temp: 36.6 C  Resp: 9    Post vital signs: Reviewed and stable  Level of consciousness: sedated  Complications: No apparent anesthesia complications

## 2014-11-18 NOTE — Consult Note (Signed)
Physical Medicine and Rehabilitation Consult  Reason for Consult: L-BKA due to PVD with gangrenous changes.  Referring Physician:  Dr. Candiss Norse   HPI: [name] is a 49 y.o. 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  She was started on IV antibiotics and MRI left foot showed anaerobic cellulitis with edema of midfoot bones due to stress reaction from diabetic neuropathy. She was evaluated by Dr. Bridgett Larsson who recommended amputation.  She underwent limb salvage attempts with SFA stent by Dr. Bridgett Larsson as well as excisional debridement with VAC placement by Dr. Iran Planas.   As wound continued to show poor healing patient was agreeable for L-BKA by Dr. Erlinda Hong on 11/17. Post op with urinary retention requiring in and out cath for > 1000 cc. PT/OT evaluations pending. CIR consulted in anticipation of rehab needs.    Review of Systems  HENT: Negative for hearing loss.   Eyes: Positive for blurred vision (wears glasses). Negative for double vision and photophobia.  Respiratory: Negative for cough and shortness of breath.   Cardiovascular: Negative for chest pain and palpitations.  Gastrointestinal: Negative for heartburn, nausea and constipation.  Genitourinary: Negative for dysuria and urgency.  Musculoskeletal: Positive for myalgias.  Neurological: Positive for sensory change, focal weakness and weakness. Negative for dizziness, tingling (denies numbness/tingling symptoms) and headaches.  Psychiatric/Behavioral: The patient has insomnia.       Past Medical History  Diagnosis Date  . Anxiety   . Depression   . Hypertension   . HIV infection ([place]) 2000  . Asthma     HOSPITALIZED WITH EXCERBATION OF ASTHMA - AND BRONCHITIS AND THE FLU DEC 2013  . Shortness of breath     ALLERGIES ARE "ACTING UP"  . Diabetes mellitus     ON INSULIN AND ORAL MEDICATIONS  . GERD (gastroesophageal reflux disease)   . Neuropathy     NEUROPATHY HANDS AND FEET  . Arthritis     LEFT HIP  . Pain     SEVERE PAIN LEFT HIP - HX OF LEFT HIP PINNING JULY 2013    Past Surgical History  Procedure Laterality Date  . Hip pinning,cannulated  07/16/2011    Procedure: CANNULATED HIP PINNING;  Surgeon: Gearlean Alf, MD;  Location: [facility];  Service: Orthopedics;  Laterality: Left;  . Hip pinning,cannulated Left 04/09/2012    Procedure: CANNULATED HIP PINNING AND HARDWARE REVISION;  Surgeon: Gearlean Alf, MD;  Location: [facility];  Service: Orthopedics;  Laterality: Left;  . Hip arthroplasty Left 05/05/2014    Procedure: ARTHROPLASTY OPEN REDUCTION INTERNAL FIXATION LEFT HIP AND REMOVAL OF TWO CANNULATED SCREW;  Surgeon: Latanya Maudlin, MD;  Location: [facility];  Service: Orthopedics;  Laterality: Left;  . Cast application Left [id number]    Procedure: CAST APPLICATION (FIBERGLASS);  Surgeon: Latanya Maudlin, MD;  Location: [facility];  Service: Orthopedics;  Laterality: Left;  CLOSED REDUCTION OF LEFT COLLES FRACTURE WITH SHORT ARM CAST  . Hardware removal Left 05/05/2014    Procedure: HARDWARE REMOVAL LEFT HIP;  Surgeon: Latanya Maudlin, MD;  Location: [facility];  Service: Orthopedics;  Laterality: Left;  REMOVAL BIOMET 6.5-8.0 CANNULATED SCREW  . Peripheral vascular catheterization N/A 11/15/2014    Procedure: Abdominal Aortogram;  Surgeon: Conrad [place], MD;  Location: [facility];  Service: Cardiovascular;  Laterality: N/A;  . Peripheral vascular catheterization  11/15/2014    Procedure: Lower Extremity Angiography;  Surgeon: Conrad [place], MD;  Location: [facility];  Service: Cardiovascular;;  . Peripheral vascular catheterization Left 11/15/2014    Procedure: Peripheral Vascular Intervention;  Surgeon: Conrad [place], MD;  Location: [facility];  Service: Cardiovascular;  Laterality:  Left;  popliteal artery stenting  . I&d extremity Left 11/16/2014    Procedure:  DEBRIDEMENT OF LEFT FOOT POSSIBLE APPLICATION OF INTEGRIA AND VAC ;  Surgeon: Irene Limbo, MD;  Location: [facility];  Service: Plastics;  Laterality: Left;    Family History  Problem Relation Age of Onset  . Diabetes Mother   . Hypertension Mother   . Vision loss Mother   . Heart disease Mother   . Hypertension Father   . Thyroid disease Neg Hx     Social History:  Lives with mother (who is disabled/amputee). Was able to walk household distances with walker and used WC due to inability to stand for prolonged periods.  She reports that she quit smoking in Oct--was smoking I PPD till then. She has a 17.5 pack-year smoking history. She has never used smokeless tobacco. She reports that she does not drink alcohol or use illicit drugs.     Allergies  Allergen Reactions  . Cortizone-10 [Hydrocortisone] Rash    Per patient, given local injection at knee and developed rash at local site.      Medications Prior to Admission  Medication Sig Dispense Refill  . ARIPiprazole (ABILIFY) 10 MG tablet Take 10 mg by mouth daily.  0  . atenolol (TENORMIN) 25 MG tablet Take 25 mg by mouth every morning.     . budesonide-formoterol (SYMBICORT) 80-4.5 MCG/ACT inhaler Inhale 2 puffs into the lungs 2 (two) times daily.    . Cholecalciferol (VITAMIN D3) 5000 UNITS TABS Take 5,000 Units by mouth daily.     . clonazePAM (KLONOPIN) 0.5 MG tablet Take 0.5 mg by mouth 4 (four) times daily.    . cyclobenzaprine (FLEXERIL) 10 MG tablet Take 10 mg by mouth 2 (two) times daily as needed for muscle spasms.   0  . escitalopram (LEXAPRO) 20 MG tablet Take 20 mg by mouth daily.    . eszopiclone (LUNESTA) 2 MG TABS tablet Take 3 mg by mouth at bedtime as needed for sleep. Take immediately before bedtime    . fenofibrate (TRICOR) 145 MG tablet Take 1 tablet (145 mg total) by mouth daily. 90 tablet 3  . furosemide (LASIX) 40 MG tablet Take  40 mg by mouth daily.  0  . HYDROcodone-acetaminophen (NORCO) 10-325 MG per tablet Take 1 tablet by mouth every 6 (six) hours as needed. (Patient taking differently: Take 1 tablet by mouth every 6 (six) hours as needed for moderate pain or severe pain. ) 30 tablet 0  . insulin aspart (NOVOLOG) 100 UNIT/ML injection Inject 0-10 Units into the skin 2 (two) times daily. Per sliding scale:  151-200 =  2 201-250 =  3 251-300 =  4 301-350 =  6 351-400 =  8 401+      = 10 units and call provider.    . Insulin Glargine (TOUJEO SOLOSTAR) 300 UNIT/ML SOPN Inject 70 Units into the skin daily.    . methocarbamol (ROBAXIN) 500 MG tablet Take 1 tablet (500 mg total) by mouth every 6 (six) hours as needed for muscle spasms. 40 tablet 1  .  methylphenidate (RITALIN) 20 MG tablet Take 20 mg by mouth daily.  0  . Multiple Vitamins-Minerals (MULTIVITAMIN WITH MINERALS) tablet Take 1 tablet by mouth daily.    . ODEFSEY 200-25-25 MG TABS tablet TAKE 1 TABLET BY MOUTH DAILY WITH BREAKFAST 30 tablet 6  . pantoprazole (PROTONIX) 40 MG tablet Take 40 mg by mouth daily.  0  . potassium chloride SA (K-DUR,KLOR-CON) 20 MEQ tablet Take 20 mEq by mouth daily.  0  . pregabalin (LYRICA) 225 MG capsule Take 225 mg by mouth 2 (two) times daily.    . rosuvastatin (CRESTOR) 20 MG tablet Take 20 mg by mouth daily.  0  . traMADol (ULTRAM) 50 MG tablet Take 100 mg by mouth every 6 (six) hours as needed for moderate pain or severe pain.   0  . vitamin E (VITAMIN E) 1000 UNIT capsule Take 1,000 Units by mouth daily.     [name] Kitchen docusate sodium (COLACE) 100 MG capsule Take 1 capsule (100 mg total) by mouth 2 (two) times daily. (Patient not taking: Reported on 11/08/2014) 40 capsule 1  . feeding supplement, GLUCERNA SHAKE, (GLUCERNA SHAKE) LIQD Take 237 mLs by mouth 3 (three) times daily between meals. (Patient not taking: Reported on 11/09/2014)  0  . insulin glargine (LANTUS) 100 UNIT/ML injection Inject 0.35 mLs (35 Units total) into the skin  daily. (Patient not taking: Reported on 11/08/2014) 15 mL 0    Home: Home Living Family/patient expects to be discharged to:: Private residence Living Arrangements: Parent  Functional History:   Functional Status:  Mobility:          ADL:    Cognition: Cognition Orientation Level: Oriented X4     Blood pressure 133/65, pulse 102, temperature 98 F (36.7 C), temperature source Oral, resp. rate 14, height 5\' 10"  (1.778 m), weight 109.77 kg (242 lb), last menstrual period 11/16/2010, SpO2 95 %. Physical Exam  Nursing note and vitals reviewed. Constitutional: She is oriented to person, place, and time. She appears well-developed and well-nourished.  Obese female sitting upright in bed.  Flat affect and reporting fatigue due to lack of sleep.  HENT:  Head: Normocephalic and atraumatic.  Mouth/Throat: Oropharynx is clear and moist.  Eyes: Conjunctivae and EOM are normal. Pupils are equal, round, and reactive to light.  Neck: Normal range of motion. Neck supple.  Cardiovascular: Normal rate and regular rhythm.  Exam reveals no gallop.   Respiratory: Effort normal and breath sounds normal. No respiratory distress. She has no wheezes.  Musculoskeletal: She exhibits tenderness (L-BKA with compressive wrap and painful to touch. ).  Tends to keep RLE rotated outward.    Neurological: She is alert and oriented to person, place, and time. No cranial nerve deficit.  Speech clear. Flat and distant. She is able to follow basic commands without difficulty.  Sensory loss from right ankle down. UES grossly 5/5. Can lift right leg off bed, ke 3+, adf/apf 4+  Skin: Skin is warm and dry. No rash noted. No erythema.  Psychiatric: Her speech is normal. Her affect is blunt. She is withdrawn. Cognition and memory are normal. She exhibits a depressed mood.    Results for orders placed or performed during the hospital encounter of 11/09/14 (from the past 24 hour(s))  Glucose, capillary     Status:  Abnormal   Collection Time: 11/18/14 11:39 AM  Result Value Ref Range   Glucose-Capillary 105 (H) 65 - 99 mg/dL  Glucose, capillary     Status: Abnormal  Collection Time: 11/18/14  2:39 PM  Result Value Ref Range   Glucose-Capillary 111 (H) 65 - 99 mg/dL  I-STAT 4, (NA,K, GLUC, HGB,HCT)     Status: Abnormal   Collection Time: 11/18/14  3:19 PM  Result Value Ref Range   Sodium 139 135 - 145 mmol/L   Potassium 4.2 3.5 - 5.1 mmol/L   Glucose, Bld 120 (H) 65 - 99 mg/dL   HCT 36.0 36.0 - 46.0 %   Hemoglobin 12.2 12.0 - 15.0 g/dL  Glucose, capillary     Status: None   Collection Time: 11/18/14  7:08 PM  Result Value Ref Range   Glucose-Capillary 86 65 - 99 mg/dL  Glucose, capillary     Status: Abnormal   Collection Time: 11/19/14 12:20 AM  Result Value Ref Range   Glucose-Capillary 126 (H) 65 - 99 mg/dL  Glucose, capillary     Status: Abnormal   Collection Time: 11/19/14  4:13 AM  Result Value Ref Range   Glucose-Capillary 106 (H) 65 - 99 mg/dL  CBC     Status: Abnormal   Collection Time: 11/19/14  5:05 AM  Result Value Ref Range   WBC 12.1 (H) 4.0 - 10.5 K/uL   RBC 3.90 3.87 - 5.11 MIL/uL   Hemoglobin 10.3 (L) 12.0 - 15.0 g/dL   HCT 33.5 (L) 36.0 - 46.0 %   MCV 85.9 78.0 - 100.0 fL   MCH 26.4 26.0 - 34.0 pg   MCHC 30.7 30.0 - 36.0 g/dL   RDW 17.7 (H) 11.5 - 15.5 %   Platelets 507 (H) 150 - 400 K/uL  Basic metabolic panel     Status: Abnormal   Collection Time: 11/19/14  5:05 AM  Result Value Ref Range   Sodium 136 135 - 145 mmol/L   Potassium 3.9 3.5 - 5.1 mmol/L   Chloride 103 101 - 111 mmol/L   CO2 24 22 - 32 mmol/L   Glucose, Bld 127 (H) 65 - 99 mg/dL   BUN 15 6 - 20 mg/dL   Creatinine, Ser 1.95 (H) 0.44 - 1.00 mg/dL   Calcium 8.7 (L) 8.9 - 10.3 mg/dL   GFR calc non Af Amer 29 (L) >60 mL/min   GFR calc Af Amer 33 (L) >60 mL/min   Anion gap 9 5 - 15  Glucose, capillary     Status: Abnormal   Collection Time: 11/19/14  7:14 AM  Result Value Ref Range    Glucose-Capillary 120 (H) 65 - 99 mg/dL   No results found.  Assessment/Plan: Diagnosis: s/p left BKA with subsequent gait disorder, weakness, balance deficits 1. Does the need for close, 24 hr/day medical supervision in concert with the patient's rehab needs make it unreasonable for this patient to be served in a less intensive setting? Yes 2. Co-Morbidities requiring supervision/potential complications: dm2 with neuropathy, HIV, htn, ckd 3. Due to bladder management, bowel management, safety, skin/wound care, disease management, medication administration, pain management and patient education, does the patient require 24 hr/day rehab nursing? Yes 4. Does the patient require coordinated care of a physician, rehab nurse, PT (1-2 hrs/day, 5 days/week) and OT (1-2 hrs/day, 5 days/week) to address physical and functional deficits in the context of the above medical diagnosis(es)? Yes Addressing deficits in the following areas: balance, endurance, locomotion, strength, transferring, bowel/bladder control, bathing, dressing, feeding, grooming, toileting and psychosocial support 5. Can the patient actively participate in an intensive therapy program of at least 3 hrs of therapy per day at least 5  days per week? Yes 6. The potential for patient to make measurable gains while on inpatient rehab is excellent 7. Anticipated functional outcomes upon discharge from inpatient rehab are modified independent  with PT, modified independent with OT, n/a with SLP. 8. Estimated rehab length of stay to reach the above functional goals is: potentially 7-12 days  9. Does the patient have adequate social supports and living environment to accommodate these discharge functional goals? Yes 10. Anticipated D/C setting: Home 11. Anticipated post D/C treatments: HH therapy and Outpatient therapy 12. Overall Rehab/Functional Prognosis: excellent  RECOMMENDATIONS: This patient's condition is appropriate for continued  rehabilitative care in the following setting: CIR Patient has agreed to participate in recommended program. Potentially Note that insurance prior authorization may be required for reimbursement for recommended care.  Comment: Rehab Admissions Coordinator to follow up. Pt is only POD #1  Thanks,  Meredith Staggers, MD, Mellody Drown     11/19/2014

## 2014-11-18 NOTE — Anesthesia Preprocedure Evaluation (Signed)
Anesthesia Evaluation  Patient identified by MRN, date of birth, ID band Patient awake    Reviewed: Allergy & Precautions, NPO status , Patient's Chart, lab work & pertinent test results  Airway Mallampati: II  TM Distance: >3 FB Neck ROM: Full    Dental  (+) Teeth Intact, Dental Advisory Given   Pulmonary Current Smoker,    breath sounds clear to auscultation       Cardiovascular hypertension,  Rhythm:Regular Rate:Normal     Neuro/Psych    GI/Hepatic   Endo/Other  diabetes  Renal/GU      Musculoskeletal   Abdominal (+) + obese,   Peds  Hematology   Anesthesia Other Findings   Reproductive/Obstetrics                             Anesthesia Physical Anesthesia Plan  ASA: III  Anesthesia Plan: General   Post-op Pain Management:    Induction: Intravenous  Airway Management Planned: LMA  Additional Equipment:   Intra-op Plan:   Post-operative Plan:   Informed Consent: I have reviewed the patients History and Physical, chart, labs and discussed the procedure including the risks, benefits and alternatives for the proposed anesthesia with the patient or authorized representative who has indicated his/her understanding and acceptance.   Dental advisory given  Plan Discussed with: CRNA and Anesthesiologist  Anesthesia Plan Comments:         Anesthesia Quick Evaluation

## 2014-11-18 NOTE — Progress Notes (Signed)
Patient has called nurse 2 times and asked for fentanyl or dilaudid. She states morphine is not helping at all and she has got to have something stronger. She is crying and states pain level still 10/10. Explained to patient NP had been paged and will repage.Patient understands.Baltazar Najjar NP paged again to make aware that pain is not being controlled with the morphine.  [facility] [employer]

## 2014-11-18 NOTE — Progress Notes (Signed)
Report called to short stay nurse.  Edward Qualia RN

## 2014-11-18 NOTE — Progress Notes (Signed)
Thank you for consult on [name]. Note that patient agreeable for BKA which is planned for tomorrow.  Recommend ordering PT/OT consults to help determine rehab needs. Will defer completing consult for now.

## 2014-11-19 ENCOUNTER — Encounter (HOSPITAL_COMMUNITY): Payer: Self-pay | Admitting: Orthopaedic Surgery

## 2014-11-19 ENCOUNTER — Telehealth: Payer: Self-pay | Admitting: *Deleted

## 2014-11-19 ENCOUNTER — Other Ambulatory Visit: Payer: Self-pay | Admitting: Infectious Diseases

## 2014-11-19 DIAGNOSIS — Z89512 Acquired absence of left leg below knee: Secondary | ICD-10-CM

## 2014-11-19 DIAGNOSIS — E1169 Type 2 diabetes mellitus with other specified complication: Secondary | ICD-10-CM

## 2014-11-19 DIAGNOSIS — L089 Local infection of the skin and subcutaneous tissue, unspecified: Secondary | ICD-10-CM

## 2014-11-19 LAB — GLUCOSE, CAPILLARY
GLUCOSE-CAPILLARY: 126 mg/dL — AB (ref 65–99)
Glucose-Capillary: 106 mg/dL — ABNORMAL HIGH (ref 65–99)
Glucose-Capillary: 120 mg/dL — ABNORMAL HIGH (ref 65–99)
Glucose-Capillary: 154 mg/dL — ABNORMAL HIGH (ref 65–99)
Glucose-Capillary: 279 mg/dL — ABNORMAL HIGH (ref 65–99)
Glucose-Capillary: 120 mg/dL — ABNORMAL HIGH (ref 65–99)

## 2014-11-19 LAB — BASIC METABOLIC PANEL
ANION GAP: 9 (ref 5–15)
BUN: 15 mg/dL (ref 6–20)
CHLORIDE: 103 mmol/L (ref 101–111)
CO2: 24 mmol/L (ref 22–32)
Calcium: 8.7 mg/dL — ABNORMAL LOW (ref 8.9–10.3)
Creatinine, Ser: 1.95 mg/dL — ABNORMAL HIGH (ref 0.44–1.00)
GFR calc Af Amer: 33 mL/min — ABNORMAL LOW (ref >60)
GFR, EST NON AFRICAN AMERICAN: 29 mL/min — AB (ref >60)
GLUCOSE: 127 mg/dL — AB (ref 65–99)
POTASSIUM: 3.9 mmol/L (ref 3.5–5.1)
Sodium: 136 mmol/L (ref 135–145)

## 2014-11-19 LAB — CBC
HEMATOCRIT: 33.5 % — AB (ref 36.0–46.0)
HEMOGLOBIN: 10.3 g/dL — AB (ref 12.0–15.0)
MCH: 26.4 pg (ref 26.0–34.0)
MCHC: 30.7 g/dL (ref 30.0–36.0)
MCV: 85.9 fL (ref 78.0–100.0)
PLATELETS: 507 10*3/uL — AB (ref 150–400)
RBC: 3.9 MIL/uL (ref 3.87–5.11)
RDW: 17.7 % — ABNORMAL HIGH (ref 11.5–15.5)
WBC: 12.1 10*3/uL — AB (ref 4.0–10.5)

## 2014-11-19 MED ORDER — SODIUM CHLORIDE 0.9 % IV SOLN
INTRAVENOUS | Status: DC
  Administered 2014-11-19: 14:00:00 via INTRAVENOUS

## 2014-11-19 MED ORDER — ADULT MULTIVITAMIN W/MINERALS CH
1.0000 | ORAL_TABLET | Freq: Every day | ORAL | Status: DC
  Administered 2014-11-19 – 2014-11-22 (×4): 1 via ORAL
  Filled 2014-11-19 (×4): qty 1

## 2014-11-19 MED ORDER — INSULIN ASPART 100 UNIT/ML ~~LOC~~ SOLN
0.0000 [IU] | Freq: Three times a day (TID) | SUBCUTANEOUS | Status: DC
  Administered 2014-11-19: 8 [IU] via SUBCUTANEOUS
  Administered 2014-11-20: 5 [IU] via SUBCUTANEOUS
  Administered 2014-11-20: 2 [IU] via SUBCUTANEOUS
  Administered 2014-11-20 – 2014-11-21 (×2): 3 [IU] via SUBCUTANEOUS
  Administered 2014-11-21: 5 [IU] via SUBCUTANEOUS
  Administered 2014-11-21: 8 [IU] via SUBCUTANEOUS
  Administered 2014-11-22 (×2): 5 [IU] via SUBCUTANEOUS

## 2014-11-19 MED ORDER — SODIUM CHLORIDE 0.9 % IV SOLN
INTRAVENOUS | Status: DC
Start: 2014-11-19 — End: 2014-11-19

## 2014-11-19 MED ORDER — INSULIN ASPART 100 UNIT/ML ~~LOC~~ SOLN: 0.0000 [IU] | Freq: Every day | SUBCUTANEOUS | Status: DC

## 2014-11-19 MED ORDER — TAMSULOSIN HCL 0.4 MG PO CAPS
0.4000 mg | ORAL_CAPSULE | Freq: Every day | ORAL | Status: DC
  Administered 2014-11-20 – 2014-11-21 (×2): 0.4 mg via ORAL
  Filled 2014-11-19 (×3): qty 1

## 2014-11-19 MED ORDER — ASPIRIN EC 325 MG PO TBEC
325.0000 mg | DELAYED_RELEASE_TABLET | Freq: Every day | ORAL | Status: DC
  Administered 2014-11-19 – 2014-11-22 (×4): 325 mg via ORAL
  Filled 2014-11-19 (×4): qty 1

## 2014-11-19 MED ORDER — HYDROMORPHONE HCL 1 MG/ML IJ SOLN
1.0000 mg | INTRAMUSCULAR | Status: DC | PRN
  Administered 2014-11-19 – 2014-11-20 (×4): 1 mg via INTRAMUSCULAR
  Filled 2014-11-19 (×4): qty 1

## 2014-11-19 MED ORDER — HYDROMORPHONE HCL 1 MG/ML IJ SOLN
1.0000 mg | INTRAMUSCULAR | Status: AC | PRN
  Administered 2014-11-19 (×3): 1 mg via INTRAMUSCULAR
  Filled 2014-11-19 (×3): qty 1

## 2014-11-19 NOTE — Telephone Encounter (Signed)
Should be descovy alone Med list updated thanks

## 2014-11-19 NOTE — Evaluation (Signed)
Occupational Therapy Evaluation Patient Details Name: [name] MRN: [id number] DOB: November 11, 1965 Today's Date: 11/19/2014    History of Present Illness Pt is a 49 y.o. female with history of HIV, chronic pain, DM type 2 with retinopathy and peripheral neuropathy, left hip and colles fracture 05/2014 with limited mobility, and PVD with left foot ulcer. She was admitted on 11/09/14 with malodorous drainage and gangrenous changes of foot. She underwent L BKA 11-18-14.   Clinical Impression   Pt with limited participation secondary to pain this session.  Did not attempt OOB but pt did perform small scoots on the EOB with max assist.  Currently needs mod to max assist for LB selfcare in sitting.  Will benefit from acute care OT for ADL re-training in order to increase functional independence for return home with her mother.  Feel she will benefit greatly from comprehensive inpatient OT prior to home.  Will continue to follow in acute care.     Follow Up Recommendations  CIR    Equipment Recommendations  Other (comment) (TBD next venue of care)       Precautions / Restrictions Precautions Precautions: Fall Restrictions Weight Bearing Restrictions: No      Mobility Bed Mobility Overal bed mobility: Needs Assistance Bed Mobility: Supine to Sit;Sit to Supine     Supine to sit: HOB elevated;Min guard Sit to supine: Min guard   General bed mobility comments: Pt able to transfer from supine to sit elevated HOB with increased time and supervision.  She also transitioned from sitting to supine with supervision as well.  Pt with increased pain in the RLE.   Transfers                 General transfer comment: Pt needed max assist for scooting up to the [facility].    Balance Overall balance assessment: Needs assistance   Sitting balance-Leahy Scale: Good                                      ADL Overall ADL's : Needs assistance/impaired Eating/Feeding:  Independent;Sitting   Grooming: Wash/dry hands;Sitting   Upper Body Bathing: Set up;Sitting   Lower Body Bathing: Moderate assistance;Sitting/lateral leans Lower Body Bathing Details (indicate cue type and reason): simulated Upper Body Dressing : Sitting;Supervision/safety   Lower Body Dressing: Total assistance;Sitting/lateral leans Lower Body Dressing Details (indicate cue type and reason): simulated             Functional mobility during ADLs: Maximal assistance General ADL Comments: Limited eval secondary to pt not wanting to attempt standing or OOB this session.  She needed max assist for scooting up the [facility] during session.   Decreased ability to reach the right foot for donning and doffing LB clothing at this time.  Pt reports using a sockaide at home from previous hip fracture.      Vision Vision Assessment?: No apparent visual deficits   Perception Perception Perception Tested?: No   Praxis Praxis Praxis tested?: Not tested    Pertinent Vitals/Pain Pain Assessment: 0-10 Pain Score: 10-Worst pain ever Pain Location: left residual limb Pain Intervention(s): Monitored during session;Limited activity within patient's tolerance     Hand Dominance Right   Extremity/Trunk Assessment Upper Extremity Assessment Upper Extremity Assessment: Overall WFL for tasks assessed;LUE deficits/detail LUE Deficits / Details: Pt with IV in wrist and history of recent wrist fracture per report.  Decreased ability to oppose  thumb to the 4th and 5th digit efficiently.  Pt with grip strength 3+/5.  All other joints AROM WFLs LUE Sensation: history of peripheral neuropathy LUE Coordination: decreased fine motor   Lower Extremity Assessment Lower Extremity Assessment: Defer to PT evaluation   Cervical / Trunk Assessment Cervical / Trunk Assessment: Normal   Communication Communication Communication: No difficulties   Cognition Arousal/Alertness: Awake/alert Behavior During  Therapy: WFL for tasks assessed/performed Overall Cognitive Status: Within Functional Limits for tasks assessed                                Home Living Family/patient expects to be discharged to:: Private residence Living Arrangements: Parent Available Help at Discharge: Family;Available 24 hours/day Type of Home: House Home Access: Stairs to enter [employer] of Steps: 3 Entrance Stairs-Rails: Right;Left Home Layout: One level     Bathroom Shower/Tub: Occupational psychologist: Standard     Home Equipment: Environmental consultant - 2 wheels;Cane - quad;Wheelchair - manual;Bedside commode;Shower seat          Prior Functioning/Environment Level of Independence: Independent with assistive device(s)        Comments: Ambulated with RW or used manual w/c. House is w/c accessible.    OT Diagnosis: Generalized weakness;Acute pain   OT Problem List: Decreased strength;Decreased activity tolerance;Impaired balance (sitting and/or standing);Pain;Decreased coordination;Decreased knowledge of use of DME or AE   OT Treatment/Interventions: Self-care/ADL training;Balance training;Patient/family education;DME and/or AE instruction;Therapeutic activities;Therapeutic exercise    OT Goals(Current goals can be found in the care plan section) Acute Rehab OT Goals Patient Stated Goal: walk with a prosthesis OT Goal Formulation: With patient Time For Goal Achievement: 12/03/14 Potential to Achieve Goals: Good  OT Frequency: Min 2X/week   Barriers to D/C: Decreased caregiver support  Unsure if pt's mother can provide physical assist as she also has an amputation but is not needing assistive device per pt report          End of Session Nurse Communication: Patient requests pain meds  Activity Tolerance: Patient limited by pain Patient left: in bed;with call bell/phone within reach   Time: [id number] OT Time Calculation (min): 29 min Charges:  OT General  Charges $OT Visit: 1 Procedure OT Evaluation $Initial OT Evaluation Tier I: 1 Procedure OT Treatments $Self Care/Home Management : 8-22 mins  Tareka Jhaveri OTR/L 11/19/2014, 4:54 PM

## 2014-11-19 NOTE — Care Management Note (Addendum)
Case Management Note Previous CM note initiated by [name] Krauss RN CM  Patient Details  Name: [name] MRN: [id number] Date of Birth: April 24, 1965  Subjective/Objective:     Pt is a transfer from [facility] for Left foot debridement and VAC placement on 11-16-14.                Action/Plan: Pt is agreeable to L BKA- plan for surgery Thursday vs Friday. Pt will benefit from SNF vs CIR once stable. CM will continue to monitor for disposition needs.     Expected Discharge Date:                 Expected Discharge Plan:  [place]  In-House Referral:  Clinical Social Work  Discharge planning Services  CM Consult  Post Acute Care Choice:  Home with [place] Choice offered to:  Patient DME Arranged:   N/A DME Agency:   N/A  HH Arranged:   Registered Nurse, Physical Therapy, Occupational Therapy, Education officer, museum and [place]:   [facility]  Status of Service:  In process, will continue to follow  Medicare Important Message Given:  Yes Date Medicare IM Given:    Medicare IM give by:    Date Additional Medicare IM Given:    Additional Medicare Important Message give by:     If discussed at [place] of Stay Meetings, dates discussed:    Additional Comments: [id number] 11-22-14 [name] Krauss, RN,BSN [phone] Pt still refusing SNF placement at this time. Per pt her mother will be home with additional family members in and out of the house.  Pt is agreeable to [facility] only if CIR is not authorized by insurance. CM did offer choice and pt chose [facility] at first and unable to provide services due to the [place]. Pt then chose [facility] and services can be provided. CM did offer pt the [facility] list and pt stated she would not be able to pay out of pocket. CM did suggest that she check with her Medicaid SW in regards to [facility] if she has hours available. Pt will need ambulance transport home. CSW is aware. CM did speak with pt  in regards to DME and pt has RW, WC, 3n1 and cane. Pt states she will not need any DME at this time. Referral made to [facility] [place] to begin within 24-48 hrs post d/c. No further needs from CM at this time.   11/19/14- Marvetta Gibbons RN, BSN - Pt s/p BKA- on 11/18/14- PT/OT evals ordered along with CIR consult- pt does not really want STSNF- has been at [facility] in past- pt limited by pain today- will see how pt progresses- hopeful for CIR admission- pending insurance auth. And pt. Tolerance to therapies. Alternate plan will be home with [place] at this time. CM to continue to follow  Dahlia Client Romeo Rabon, RN 11/19/2014, 12:08 PM

## 2014-11-19 NOTE — Progress Notes (Signed)
Utilization review completed.  

## 2014-11-19 NOTE — Progress Notes (Addendum)
Nutrition Follow-up  DOCUMENTATION CODES:   Obesity unspecified  INTERVENTION:    Glucerna Shake PO TID, each supplement provides 220 kcal and 10 grams of protein  MVI daily  Continue snacks TID between meals.  NUTRITION DIAGNOSIS:   Increased nutrient needs related to acute illness as evidenced by estimated needs.  Ongoing  GOAL:   Patient will meet greater than or equal to 90% of their needs  Unmet  MONITOR:   PO intake, Supplement acceptance, Labs, Weight trends, Skin  ASSESSMENT:   [name] is a 49 y.o. female With h/o IDDM2, HIV, obesity presented to [facility] ED due to left foot wound for the last two months, has progressively gotten worse, now with malodorous drainage, denies fever, no significant pain (h/o diabetic neuropathy), she was seen by her endocrinology , was told to come to the ED to have the wound evaluated, she did not come yesterday, but she came today.  S/P left BKA on 11/17. Patient has been NPO, diet just advanced for lunch today. Patient is hungry and likes the Glucerna Shakes. Was also receiving snacks between meals prior to surgery yesterday. Patient with increased protein and calorie needs to support wound healing.  Diet Order:  Diet heart healthy/carb modified Room service appropriate?: Yes; Fluid consistency:: Thin  Skin:  Wound (see comment) (Left leg BKA )  Last BM:  11/16  Height:   Ht Readings from Last 1 Encounters:  11/10/14 5\' 10"  (1.778 m)    Weight:   Wt Readings from Last 1 Encounters:  11/19/14 242 lb (109.77 kg)    Ideal Body Weight:  68.2 kg  BMI:  Body mass index is 34.72 kg/(m^2).  Estimated Nutritional Needs:   Kcal:  2200-2500  Protein:  130-160 gm  Fluid:  2.5 L  EDUCATION NEEDS:   No education needs identified at this time  Molli Barrows, [place], [place], [place] Pager [phone] After Hours Pager [phone]

## 2014-11-19 NOTE — Progress Notes (Signed)
Bladder scan with 682ml residual, pt able to void in bedpan, will continue to monitor closely.  Edward Qualia RN

## 2014-11-19 NOTE — Progress Notes (Signed)
   Subjective:  Patient reports pain as mild.    Objective:   VITALS:   Filed Vitals:   11/19/14 0520 11/19/14 0610 11/19/14 0715 11/19/14 1124  BP: 120/71 133/65    Pulse: 104 102    Temp:   98 F (36.7 C) 99.2 F (37.3 C)  TempSrc:   Oral Oral  Resp: 18 14    Height:      Weight:      SpO2: 95% 95%      Neurologically intact ABD soft Incision: dressing C/D/I and no drainage No cellulitis present Compartment soft   Lab Results  Component Value Date   WBC 12.1* 11/19/2014   HGB 10.3* 11/19/2014   HCT 33.5* 11/19/2014   MCV 85.9 11/19/2014   PLT 507* 11/19/2014     Assessment/Plan:  1 Day Post-Op   - Expected postop acute blood loss anemia - will monitor for symptoms - Up with PT/OT - DVT ppx - SCDs, ambulation, asa - NWB operative extremity - Pain control - margins clear without signs of infection at the level of amputation - Discharge planning  Marianna Payment 11/19/2014, 1:32 PM [phone]

## 2014-11-19 NOTE — Telephone Encounter (Signed)
Pt's PA for [place] runs out 12/02/14, needing reauthorized.  Current medication profile has Angus Seller and Descovy listed.  MD please advise regarding this profile prior to RN obtaining prior authorization for [facility].

## 2014-11-19 NOTE — Progress Notes (Signed)
Pt still unable to void, in and out cath complete, 1150 urine returned.  Edward Qualia RN

## 2014-11-19 NOTE — Progress Notes (Signed)
Patient Has not voided since right after surgery and attempted to on bedpan. Patient unable to and bladder scan done with >837cc in bladder. Patient made aware that MD would be notified and if unable to void on her own we would have to get an order to do an in and out cath. Patient states she does not want in and out cath, she can not void on demand, and if she drinks some water and soda she will be able to void. Reported to oncoming nurse and she will follow up to make sure patient voids or order obtained to do in and out cath. [facility] [employer]

## 2014-11-19 NOTE — Evaluation (Signed)
Physical Therapy Evaluation Patient Details Name: [name] MRN: [id number] DOB: Mar 30, 1965 Today's Date: 11/19/2014   History of Present Illness  Pt is a 49 y.o. female with history of HIV, chronic pain, DM type 2 with retinopathy and peripheral neuropathy, left hip and colles fracture 05/2014 with limited mobility, and PVD with left foot ulcer. She was admitted on 11/09/14 with malodorous drainage and gangrenous changes of foot. She underwent L BKA 11-18-14.  Clinical Impression  Pt admitted with above diagnosis. Pt currently with functional limitations due to the deficits listed below (see PT Problem List). On eval, mobility limited to EOB due to pain. Pt will benefit from skilled PT to increase their independence and safety with mobility to allow discharge to the venue listed below.  Pt is an excellent candidate for CIR. She is very motivated and demo good potential for progress with pain management.     Follow Up Recommendations CIR    Equipment Recommendations  None recommended by PT    Recommendations for Other Services Rehab consult     Precautions / Restrictions Precautions Precautions: Fall      Mobility  Bed Mobility Overal bed mobility: Needs Assistance Bed Mobility: Supine to Sit;Sit to Supine     Supine to sit: Min assist;HOB elevated Sit to supine: Min assist   General bed mobility comments: continuous verbal cues for sequencing. Pt prefers no physical assist due to pain.  Transfers                 General transfer comment: unable to address due to pain  Ambulation/Gait                Stairs            Wheelchair Mobility    Modified Rankin (Stroke Patients Only)       Balance Overall balance assessment: Needs assistance Sitting-balance support: No upper extremity supported;Feet supported Sitting balance-Leahy Scale: Good Sitting balance - Comments: Pt sat EOB x 7-10 minutes.                                      Pertinent Vitals/Pain Pain Assessment: 0-10 Pain Score: 10-Worst pain ever Pain Location: L residual limb with mobility Pain Descriptors / Indicators: Burning;Sore;Heaviness Pain Intervention(s): Limited activity within patient's tolerance;Premedicated before session;Monitored during session    Home Living Family/patient expects to be discharged to:: Private residence Living Arrangements: Parent Available Help at Discharge: Family;Available 24 hours/day Type of Home: House Home Access: Stairs to enter Entrance Stairs-Rails: Psychiatric nurse of Steps: 3 Home Layout: One level Home Equipment: Walker - 2 wheels;Cane - quad;Wheelchair - manual;Bedside commode;Shower seat      Prior Function Level of Independence: Independent with assistive device(s)         Comments: Ambulated with RW or used manual w/c. House is w/c accessible.     Hand Dominance   Dominant Hand: Right    Extremity/Trunk Assessment   Upper Extremity Assessment: Defer to OT evaluation           Lower Extremity Assessment: LLE deficits/detail   LLE Deficits / Details: able to move L knee through 0-90 AROM sitting EOB     Communication   Communication: No difficulties  Cognition Arousal/Alertness: Awake/alert Behavior During Therapy: Agitated (depressed) Overall Cognitive Status: Within Functional Limits for tasks assessed  General Comments      Exercises        Assessment/Plan    PT Assessment Patient needs continued PT services  PT Diagnosis Difficulty walking;Acute pain   PT Problem List Decreased activity tolerance;Decreased balance;Decreased mobility;Pain;Decreased knowledge of precautions;Decreased knowledge of use of DME;Decreased strength  PT Treatment Interventions DME instruction;Gait training;Functional mobility training;Therapeutic activities;Therapeutic exercise;Patient/family education;Balance training   PT Goals  (Current goals can be found in the Care Plan section) Acute Rehab PT Goals Patient Stated Goal: walk with a prosthesis PT Goal Formulation: With patient Time For Goal Achievement: 12/03/14 Potential to Achieve Goals: Good    Frequency Min 4X/week   Barriers to discharge        Co-evaluation               End of Session   Activity Tolerance: Patient limited by pain Patient left: in bed;with call bell/phone within reach Nurse Communication: Mobility status         Time: [id number] PT Time Calculation (min) (ACUTE ONLY): 34 min   Charges:   PT Evaluation $Initial PT Evaluation Tier I: 1 Procedure PT Treatments $Therapeutic Activity: 8-22 mins   PT G Codes:        Lorriane Shire 11/19/2014, 10:32 AM

## 2014-11-19 NOTE — Progress Notes (Signed)
I met with pt to discuss an inpt rehab admission pending further PT progress once able to do more with therapy due to her pain and POD #1. OT eval pending. Pt recently at [facility] for 4 months and then discharge home with her mother who is also an amputee. I discussed the need to demonstrate ability to do more intense therapy as demonstrated with her progress over the weekend. Pt states she prefers inpt rehab and if not admitted, she would prefer Home with [facility] and not return to SNF. I will follow up on Monday. [id number]

## 2014-11-19 NOTE — Progress Notes (Signed)
Patient Demographics:    [name], is a 49 y.o. female, DOB - [birth year], [id number]  Admit date - 11/09/2014   Admitting Physician Florencia Reasons, MD  Outpatient Primary MD for the patient is [name]  LOS - 10  Brief summary  Patient admitted on 11/09/2014 [facility] for nonhealing left foot ulcer, patient was started on vancomycin and Fortaz, orthopedic was consulted and currently we're following, MRI shows anaerobic cellulitis with probable subcutaneous emphysema. Orthopedic recommends plastic surgery consult, plastic surgery with attempted debridement but requested vascular surgery consultation.  She was subsequently transferred on 11/16/2014 to [facility] for left foot wound debridement and wound VAC placement by plastic surgery and also underwent L.SFA stent, she is now due for left BKA on 11/18/2014.    Chief Complaint  Patient presents with  . Wound Infection        Subjective:    [name] today has, No headache, No chest pain, No abdominal pain - No Nausea, No new weakness tingling or numbness, No Cough - SOB. Left foot pain has improved.   Assessment  & Plan :     1. Diabetic left foot infection and gangrene with underlying PAD - MRI shows no evidence of osteomyelitis however clinically there is cellulitis, currently on vancomycin and Zosyn, orthopedic surgery, plastic surgery and vascular surgery on board.   Patient underwent foot wound debridement with wound VAC placement on 11/16/2014, had L Leg L SFA stent 11-14-14 by Dr Bridgett Larsson on 11-14-14 & finally left BKA by Ortho on 11-18-14, will stop ABX after 11-19-14, PT and placement.   2. Chronic hypotension worsened by dehydration and possible sepsis early during the admission. Antihypertensive  medications and Lasix have been held, blood pressure has stabilized after IV fluids will monitor. TSH and cortisol are stable.   3. HIV. Follows with Dr. Johnnye Sima. CD4 count 570 in late October 2016. Continue home medications for now.   4. Dyslipidemia. On statin continue.   5. Depression. Continue Abilify and Lexapro. Klonopin as needed for anxiety.   6. CK D4 Baseline creatinine is around 1.9. Monitor.    7.Chronic diastolic dysfunction with EF 60% on this admission. Compensated.   8. DM type II. Currently on Lantus and sliding scale dose .   Lab Results  Component Value Date   HGBA1C 10.7* 11/10/2014    CBG (last 3)   Recent Labs  11/19/14 0413 11/19/14 0714 11/19/14 1126  GLUCAP 106* 120* 154*       Code Status : Full  Family Communication  : None present  Disposition Plan  : CIR versus SNF in the next 1-2 days  Consults  :    Orthopedics Plastic surgery  Vascular surgery  Procedures  :   TTE - chronic grade 1 diastolic dysfunction, EF 123456.  ABI left foot severe disease with ABI of 0.53, right leg mild arterial disease with ABI of 0.91.  L Leg L SFA stent 11-14-14 by Dr Bridgett Larsson  L foot wound debridement and wound VAC placement on 11/16/2014 by plastic surgery  Left BKA 11-18-14  DVT Prophylaxis  : Heparin   Lab Results  Component Value Date   PLT 507* 11/19/2014    Inpatient Medications  Scheduled Meds: .  ARIPiprazole  10 mg Oral Daily  . budesonide-formoterol  2 puff Inhalation BID  . escitalopram  20 mg Oral Daily  . feeding supplement (GLUCERNA SHAKE)  237 mL Oral TID BM  . insulin aspart  0-15 Units Subcutaneous TID WC  . insulin aspart  0-5 Units Subcutaneous QHS  . insulin glargine  35 Units Subcutaneous QPC supper  . methylphenidate  20 mg Oral Daily  . multivitamin with minerals  1 tablet Oral Daily  . piperacillin-tazobactam (ZOSYN)  IV  3.375 g Intravenous Q8H  . pregabalin  150 mg Oral BID  . rosuvastatin  20 mg Oral Daily   . saccharomyces boulardii  250 mg Oral BID  . tamsulosin  0.4 mg Oral QPC supper  . vancomycin  1,000 mg Intravenous Q24H   Continuous Infusions: . sodium chloride     PRN Meds:.acetaminophen, clonazePAM, cyclobenzaprine, ipratropium-albuterol, loperamide, ondansetron (ZOFRAN) IV, oxyCODONE-acetaminophen  Antibiotics  :     Anti-infectives    Start     Dose/Rate Route Frequency Ordered Stop   11/12/14 2200  vancomycin (VANCOCIN) IVPB 1000 mg/200 mL premix     1,000 mg 200 mL/hr over 60 Minutes Intravenous Every 24 hours 11/12/14 1956 11/19/14 2359   11/10/14 2000  vancomycin (VANCOCIN) 1,250 mg in sodium chloride 0.9 % 250 mL IVPB  Status:  Discontinued     1,250 mg 166.7 mL/hr over 90 Minutes Intravenous Every 24 hours 11/10/14 0212 11/12/14 1954   11/10/14 0800  emtricitabine-rilpivir-tenofovir AF (ODEFSEY) 200-25-25 MG per tablet 1 tablet  Status:  Discontinued     1 tablet Oral Daily with breakfast 11/09/14 1837 11/10/14 0144   11/10/14 0800  emtricitabine-tenofovir AF (DESCOVY) 200-25 MG per tablet 1 tablet  Status:  Discontinued     1 tablet Oral Daily with breakfast 11/10/14 0144 11/19/14 1211   11/10/14 0800  rilpivirine (EDURANT) tablet 25 mg  Status:  Discontinued     25 mg Oral Daily with breakfast 11/10/14 0144 11/19/14 1211   11/10/14 0215  piperacillin-tazobactam (ZOSYN) IVPB 3.375 g     3.375 g 12.5 mL/hr over 240 Minutes Intravenous Every 8 hours 11/10/14 0208 11/19/14 2359   11/10/14 0215  vancomycin (VANCOCIN) IVPB 1000 mg/200 mL premix     1,000 mg 200 mL/hr over 60 Minutes Intravenous  Once 11/10/14 0209 11/10/14 0441   11/09/14 1545  vancomycin (VANCOCIN) IVPB 1000 mg/200 mL premix     1,000 mg 200 mL/hr over 60 Minutes Intravenous  Once 11/09/14 1530 11/09/14 1855   11/09/14 1530  piperacillin-tazobactam (ZOSYN) IVPB 3.375 g     3.375 g 12.5 mL/hr over 240 Minutes Intravenous  Once 11/09/14 1530 11/09/14 1758        Objective:   Filed Vitals:    11/19/14 0520 11/19/14 0610 11/19/14 0715 11/19/14 1124  BP: 120/71 133/65    Pulse: 104 102    Temp:   98 F (36.7 C) 99.2 F (37.3 C)  TempSrc:   Oral Oral  Resp: 18 14    Height:      Weight:      SpO2: 95% 95%      Wt Readings from Last 3 Encounters:  11/19/14 109.77 kg (242 lb)  11/09/14 110.224 kg (243 lb)  07/08/14 110.224 kg (243 lb)     Intake/Output Summary (Last 24 hours) at 11/19/14 1306 Last data filed at 11/19/14 0036  Gross per 24 hour  Intake   1430 ml  Output    200 ml  Net  1230 ml     Physical Exam  Awake Alert, Oriented X 3, No new F.N deficits, Normal affect [place].AT,PERRAL Supple Neck,No JVD, No cervical lymphadenopathy appriciated.  Symmetrical Chest wall movement, Good air movement bilaterally, CTAB RRR,No Gallops,Rubs or new Murmurs, No Parasternal Heave +ve B.Sounds, Abd Soft, No tenderness, No organomegaly appriciated, No rebound - guarding or rigidity. No Cyanosis, Clubbing or edema, No new Rash or bruise L BKA    Data Review:   Micro Results Recent Results (from the past 240 hour(s))  MRSA PCR Screening     Status: None   Collection Time: 11/10/14  2:22 AM  Result Value Ref Range Status   MRSA by PCR NEGATIVE NEGATIVE Final    Comment:        The GeneXpert MRSA Assay (FDA approved for NASAL specimens only), is one component of a comprehensive MRSA colonization surveillance program. It is not intended to diagnose MRSA infection nor to guide or monitor treatment for MRSA infections.   Wound culture     Status: None   Collection Time: 11/10/14  2:22 AM  Result Value Ref Range Status   Specimen Description WOUND L FOOT  Final   Special Requests NONE  Final   Gram Stain   Final    NO WBC SEEN NO SQUAMOUS EPITHELIAL CELLS SEEN ABUNDANT GRAM POSITIVE COCCI IN PAIRS IN CLUSTERS ABUNDANT GRAM NEGATIVE RODS Performed at [facility]    Culture   Final    MULTIPLE ORGANISMS PRESENT, NONE PREDOMINANT Note: NO  STAPHYLOCOCCUS AUREUS ISOLATED NO GROUP A STREP (S.PYOGENES) ISOLATED Performed at [facility]    Report Status 11/12/2014 FINAL  Final  MRSA PCR Screening     Status: None   Collection Time: 11/12/14 12:24 PM  Result Value Ref Range Status   MRSA by PCR NEGATIVE NEGATIVE Final    Comment:        The GeneXpert MRSA Assay (FDA approved for NASAL specimens only), is one component of a comprehensive MRSA colonization surveillance program. It is not intended to diagnose MRSA infection nor to guide or monitor treatment for MRSA infections.   C difficile quick scan w PCR reflex     Status: None   Collection Time: 11/14/14  2:00 PM  Result Value Ref Range Status   C Diff antigen NEGATIVE NEGATIVE Final   C Diff toxin NEGATIVE NEGATIVE Final   C Diff interpretation Negative for toxigenic C. difficile  Final    Radiology Reports Mr Foot Left Wo Contrast  11/10/2014  CLINICAL DATA:  Diabetic foot ulcer.  Redness and swelling. EXAM: MRI OF THE LEFT FOREFOOT WITHOUT CONTRAST TECHNIQUE: Multiplanar, multisequence MR imaging was performed. No intravenous contrast was administered. COMPARISON:  Radiographs dated 11/09/2014 FINDINGS: There is extensive subcutaneous gas on the dorsum of the midfoot. No discrete abscesses. There is no bone destruction in the midfoot although there is edema in the cuneiforms, cuboid, and navicular as well as slight edema in the bases of the metatarsals. There is a chronic Freiberg's infraction of the head of the second metatarsal, avascular necrosis. There is small ankle joint effusion. Visualized muscles and tendons are normal. IMPRESSION: Findings consistent with anaerobic cellulitis of the dorsum of the foot. The edema in the bones of the midfoot are most likely stress reactions with secondary to diabetic neuropathy. The findings are not consistent with avascular necrosis in the midfoot or osteomyelitis. Electronically Signed   By: Lorriane Shire M.D.   On:  11/10/2014 07:37  Dg Chest Port 1 View  11/16/2014  CLINICAL DATA:  Hypoglycemia this morning. Cough. Initial encounter. EXAM: PORTABLE CHEST 1 VIEW COMPARISON:  Single view of the chest 07/08/2014 and 11/12/2014. FINDINGS: The lungs are clear. Heart size is normal. No pneumothorax or pleural effusion. Remote right rib fractures are noted. IMPRESSION: No acute disease. Electronically Signed   By: Inge Rise M.D.   On: 11/16/2014 08:00   Dg Chest Port 1 View  11/12/2014  CLINICAL DATA:  Shortness of Breath EXAM: PORTABLE CHEST 1 VIEW COMPARISON:  July 08, 2014 FINDINGS: There is no edema or consolidation. Heart is mildly enlarged with pulmonary vascularity within normal limits. No adenopathy. There are healed rib fractures bilaterally. IMPRESSION: No edema or consolidation.  Stable cardiac prominence. Electronically Signed   By: Lowella Grip III M.D.   On: 11/12/2014 08:11   Dg Foot 2 Views Left  11/09/2014  CLINICAL DATA:  Diabetic foot ulcer on the dorsum of the foot and ankle. EXAM: LEFT FOOT - 2 VIEW COMPARISON:  None. FINDINGS: There is an open wound on the dorsum of the midfoot with extensive subcutaneous emphysema. No obvious destructive bony changes to suggest osteomyelitis. No destructive joint changes to suggest septic arthritis. There is chronic appearing AVN of the second metatarsal head (Freiberg's infraction). IMPRESSION: 1. Open wound on the dorsum of the midfoot with subcutaneous emphysema. 2. No definite plain film findings to suggest septic arthritis or osteomyelitis 3. Chronic AVN of the second metatarsal head. Electronically Signed   By: Marijo Sanes M.D.   On: 11/09/2014 16:20     CBC  Recent Labs Lab 11/13/14 0420 11/13/14 1157 11/14/14 0340 11/15/14 0350 11/15/14 1714 11/16/14 1140 11/17/14 0350 11/18/14 1519 11/19/14 0505  WBC 11.8* 11.3* 12.3* 12.9* 10.8* 10.1 9.1  --  12.1*  HGB 7.9* 8.5* 8.6* 8.1* 9.1* 8.5* 8.1* 12.2 10.3*  HCT 25.9* 27.4* 27.6*  26.2* 28.5* 26.6* 26.7* 36.0 33.5*  PLT 459* 459* 478* 582* 536* PLATELET CLUMPS NOTED ON SMEAR, COUNT APPEARS ADEQUATE 471*  --  507*  MCV 84.6 86.2 85.7 84.5 82.6 84.2 85.9  --  85.9  MCH 25.8* 26.7 26.7 26.1 26.4 26.9 26.0  --  26.4  MCHC 30.5 31.0 31.2 30.9 31.9 32.0 30.3  --  30.7  RDW 17.5* 17.6* 17.6* 17.4* 17.5* 17.6* 17.9*  --  17.7*  LYMPHSABS 1.8 1.8 1.9 2.2  --   --   --   --   --   MONOABS 0.8 0.7 0.7 0.7  --   --   --   --   --   EOSABS 0.4 0.5 0.4 0.4  --   --   --   --   --   BASOSABS 0.0 0.0 0.0 0.0  --   --   --   --   --     Chemistries   Recent Labs Lab 11/13/14 0420 11/14/14 0340 11/15/14 0350 11/15/14 1714 11/16/14 0835 11/17/14 0350 11/18/14 1519 11/19/14 0505  NA 131* 133* 137  --  141 138 139 136  K 3.8 4.2 4.2  --  3.4* 3.7 4.2 3.9  CL 99* 99* 100*  --  109 105  --  103  CO2 26 26 28   --  25 24  --  24  GLUCOSE 195* 220* 125*  --  70 132* 120* 127*  BUN 27* 27* 27*  --  19 14  --  15  CREATININE 1.71* 1.71* 1.74* 1.69* 1.64* 1.62*  --  1.95*  CALCIUM 8.7* 9.0 9.1  --  8.9 8.8*  --  8.7*  MG  --   --   --   --  1.9 1.9  --   --   AST 10* 11* 12*  --  17  --   --   --   ALT 9* 7* 9*  --  9*  --   --   --   ALKPHOS 80 72 80  --  78  --   --   --   BILITOT 0.6 0.4 0.8  --  0.2*  --   --   --    ------------------------------------------------------------------------------------------------------------------ estimated creatinine clearance is 46.3 mL/min (by C-G formula based on Cr of 1.95). ------------------------------------------------------------------------------------------------------------------ No results for input(s): HGBA1C in the last 72 hours. ------------------------------------------------------------------------------------------------------------------ No results for input(s): CHOL, HDL, LDLCALC, TRIG, CHOLHDL, LDLDIRECT in the last 72  hours. ------------------------------------------------------------------------------------------------------------------ No results for input(s): TSH, T4TOTAL, T3FREE, THYROIDAB in the last 72 hours.  Invalid input(s): FREET3 ------------------------------------------------------------------------------------------------------------------ No results for input(s): VITAMINB12, FOLATE, FERRITIN, TIBC, IRON, RETICCTPCT in the last 72 hours.  Coagulation profile  Recent Labs Lab 11/15/14 0350  INR 1.18    No results for input(s): DDIMER in the last 72 hours.  Cardiac Enzymes No results for input(s): CKMB, TROPONINI, MYOGLOBIN in the last 168 hours.  Invalid input(s): CK ------------------------------------------------------------------------------------------------------------------ Invalid input(s): POCBNP   Time Spent in minutes  35   Obinna Ehresman K M.D on 11/19/2014 at 1:06 PM  Between 7am to 7pm - Pager - [phone]  After 7pm go to [email or web address] - password [facility]  [facility] -  Office  [phone]

## 2014-11-20 LAB — BASIC METABOLIC PANEL
ANION GAP: 6 (ref 5–15)
BUN: 15 mg/dL (ref 6–20)
CALCIUM: 8.6 mg/dL — AB (ref 8.9–10.3)
CO2: 28 mmol/L (ref 22–32)
CREATININE: 1.92 mg/dL — AB (ref 0.44–1.00)
Chloride: 102 mmol/L (ref 101–111)
GFR calc Af Amer: 34 mL/min — ABNORMAL LOW (ref >60)
GFR, EST NON AFRICAN AMERICAN: 29 mL/min — AB (ref >60)
GLUCOSE: 170 mg/dL — AB (ref 65–99)
Potassium: 4.1 mmol/L (ref 3.5–5.1)
Sodium: 136 mmol/L (ref 135–145)

## 2014-11-20 LAB — CBC
HEMATOCRIT: 30.6 % — AB (ref 36.0–46.0)
Hemoglobin: 9.2 g/dL — ABNORMAL LOW (ref 12.0–15.0)
MCH: 26.1 pg (ref 26.0–34.0)
MCHC: 30.1 g/dL (ref 30.0–36.0)
MCV: 86.9 fL (ref 78.0–100.0)
PLATELETS: 417 10*3/uL — AB (ref 150–400)
RBC: 3.52 MIL/uL — ABNORMAL LOW (ref 3.87–5.11)
RDW: 17.7 % — AB (ref 11.5–15.5)
WBC: 7.5 10*3/uL (ref 4.0–10.5)

## 2014-11-20 LAB — MAGNESIUM: Magnesium: 2.1 mg/dL (ref 1.7–2.4)

## 2014-11-20 LAB — GLUCOSE, CAPILLARY
GLUCOSE-CAPILLARY: 223 mg/dL — AB (ref 65–99)
GLUCOSE-CAPILLARY: 189 mg/dL — AB (ref 65–99)
Glucose-Capillary: 143 mg/dL — ABNORMAL HIGH (ref 65–99)
Glucose-Capillary: 157 mg/dL — ABNORMAL HIGH (ref 65–99)

## 2014-11-20 MED ORDER — HYDROMORPHONE HCL 1 MG/ML IJ SOLN
1.0000 mg | INTRAMUSCULAR | Status: DC | PRN
  Administered 2014-11-20 – 2014-11-22 (×12): 1 mg via INTRAVENOUS
  Filled 2014-11-20 (×12): qty 1

## 2014-11-20 MED ORDER — HEPARIN SODIUM (PORCINE) 5000 UNIT/ML IJ SOLN
5000.0000 [IU] | Freq: Three times a day (TID) | INTRAMUSCULAR | Status: DC
  Administered 2014-11-20 – 2014-11-22 (×7): 5000 [IU] via SUBCUTANEOUS
  Filled 2014-11-20 (×7): qty 1

## 2014-11-20 NOTE — Progress Notes (Signed)
Patient Demographics:    [name], is a 49 y.o. female, DOB - 04/19/1965, [id number]  Admit date - 11/09/2014   Admitting Physician Florencia Reasons, MD  Outpatient Primary MD for the patient is [name]  LOS - 11  Brief summary  Patient admitted on 11/09/2014 [facility] for nonhealing left foot ulcer, patient was started on vancomycin and Fortaz, orthopedic was consulted and currently we're following, MRI shows anaerobic cellulitis with probable subcutaneous emphysema. Orthopedic recommends plastic surgery consult, plastic surgery with attempted debridement but requested vascular surgery consultation.  She was subsequently transferred on 11/16/2014 to [facility] for left foot wound debridement and wound VAC placement by plastic surgery and also underwent L.SFA stent, she is now due for left BKA on 11/18/2014.    Chief Complaint  Patient presents with  . Wound Infection        Subjective:    [name] today has, No headache, No chest pain, No abdominal pain - No Nausea, No new weakness tingling or numbness, No Cough - SOB. Left stump pain.   Assessment  & Plan :     1. Diabetic left foot infection and gangrene with underlying PAD - MRI shows no evidence of osteomyelitis however clinically there is cellulitis, currently on vancomycin and Zosyn, orthopedic surgery, plastic surgery and vascular surgery on board.   Patient underwent foot wound debridement with wound VAC placement on 11/16/2014, had L Leg L SFA stent 11-14-14 by Dr Bridgett Larsson on 11-14-14 & finally left BKA by Ortho on 11-18-14, stopped antibiotics on 11-19-14, PT and placement initiated.   Minimal perioperative blood loss rated anemia not requiring transfusion.   2. Chronic hypotension worsened by  dehydration and possible sepsis early during the admission. Antihypertensive medications and Lasix have been held, blood pressure has stabilized after IV fluids will monitor. TSH and cortisol are stable.   3. HIV. Follows with Dr. Johnnye Sima. CD4 count 570 in late October 2016. Continue home medications for now.   4. Dyslipidemia. On statin continue.   5. Depression. Continue Abilify and Lexapro. Klonopin as needed for anxiety.   6. CK D4 Baseline creatinine is around 1.9. Monitor.    7.Chronic diastolic dysfunction with EF 60% on this admission. Compensated.   8. DM type II. Currently on Lantus and sliding scale dose .   Lab Results  Component Value Date   HGBA1C 10.7* 11/10/2014    CBG (last 3)   Recent Labs  11/19/14 1609 11/19/14 2104 11/20/14 0805  GLUCAP 279* 120* 143*       Code Status : Full  Family Communication  : None present  Disposition Plan  : CIR versus SNF in the next 1-2 days  Consults  :    Orthopedics Plastic surgery  Vascular surgery  Procedures  :   TTE - chronic grade 1 diastolic dysfunction, EF 123456.  ABI left foot severe disease with ABI of 0.53, right leg mild arterial disease with ABI of 0.91.  L Leg L SFA stent 11-14-14 by Dr Bridgett Larsson  L foot wound debridement and wound VAC placement on 11/16/2014 by plastic surgery  Left BKA 11-18-14  DVT Prophylaxis  : Heparin   Lab Results  Component Value Date   PLT 417*  11/20/2014    Inpatient Medications  Scheduled Meds: . ARIPiprazole  10 mg Oral Daily  . aspirin EC  325 mg Oral Daily  . budesonide-formoterol  2 puff Inhalation BID  . escitalopram  20 mg Oral Daily  . feeding supplement (GLUCERNA SHAKE)  237 mL Oral TID BM  . heparin subcutaneous  5,000 Units Subcutaneous 3 times per day  . insulin aspart  0-15 Units Subcutaneous TID WC  . insulin aspart  0-5 Units Subcutaneous QHS  . insulin glargine  35 Units Subcutaneous QPC supper  . methylphenidate  20 mg Oral Daily  .  multivitamin with minerals  1 tablet Oral Daily  . pregabalin  150 mg Oral BID  . rosuvastatin  20 mg Oral Daily  . saccharomyces boulardii  250 mg Oral BID  . tamsulosin  0.4 mg Oral QPC supper   Continuous Infusions:   PRN Meds:.acetaminophen, clonazePAM, cyclobenzaprine, HYDROmorphone (DILAUDID) injection, ipratropium-albuterol, loperamide, ondansetron (ZOFRAN) IV, oxyCODONE-acetaminophen  Antibiotics  :     Anti-infectives    Start     Dose/Rate Route Frequency Ordered Stop   11/12/14 2200  vancomycin (VANCOCIN) IVPB 1000 mg/200 mL premix     1,000 mg 200 mL/hr over 60 Minutes Intravenous Every 24 hours 11/12/14 1956 11/19/14 2302   11/10/14 2000  vancomycin (VANCOCIN) 1,250 mg in sodium chloride 0.9 % 250 mL IVPB  Status:  Discontinued     1,250 mg 166.7 mL/hr over 90 Minutes Intravenous Every 24 hours 11/10/14 0212 11/12/14 1954   11/10/14 0800  emtricitabine-rilpivir-tenofovir AF (ODEFSEY) 200-25-25 MG per tablet 1 tablet  Status:  Discontinued     1 tablet Oral Daily with breakfast 11/09/14 1837 11/10/14 0144   11/10/14 0800  emtricitabine-tenofovir AF (DESCOVY) 200-25 MG per tablet 1 tablet  Status:  Discontinued     1 tablet Oral Daily with breakfast 11/10/14 0144 11/19/14 1211   11/10/14 0800  rilpivirine (EDURANT) tablet 25 mg  Status:  Discontinued     25 mg Oral Daily with breakfast 11/10/14 0144 11/19/14 1211   11/10/14 0215  piperacillin-tazobactam (ZOSYN) IVPB 3.375 g     3.375 g 12.5 mL/hr over 240 Minutes Intravenous Every 8 hours 11/10/14 0208 11/19/14 2359   11/10/14 0215  vancomycin (VANCOCIN) IVPB 1000 mg/200 mL premix     1,000 mg 200 mL/hr over 60 Minutes Intravenous  Once 11/10/14 0209 11/10/14 0441   11/09/14 1545  vancomycin (VANCOCIN) IVPB 1000 mg/200 mL premix     1,000 mg 200 mL/hr over 60 Minutes Intravenous  Once 11/09/14 1530 11/09/14 1855   11/09/14 1530  piperacillin-tazobactam (ZOSYN) IVPB 3.375 g     3.375 g 12.5 mL/hr over 240 Minutes  Intravenous  Once 11/09/14 1530 11/09/14 1758        Objective:   Filed Vitals:   11/19/14 2227 11/19/14 2351 11/20/14 0528 11/20/14 0805  BP:    140/81  Pulse:      Temp:  98.6 F (37 C) 98.5 F (36.9 C) 100 F (37.8 C)  TempSrc:  Oral Oral Oral  Resp:    15  Height:      Weight:   108.863 kg (240 lb)   SpO2: 97%       Wt Readings from Last 3 Encounters:  11/20/14 108.863 kg (240 lb)  11/09/14 110.224 kg (243 lb)  07/08/14 110.224 kg (243 lb)     Intake/Output Summary (Last 24 hours) at 11/20/14 0959 Last data filed at 11/20/14 0806  Gross per 24  hour  Intake    440 ml  Output   2500 ml  Net  -2060 ml     Physical Exam  Awake Alert, Oriented X 3, No new F.N deficits, Normal affect Graf.AT,PERRAL Supple Neck,No JVD, No cervical lymphadenopathy appriciated.  Symmetrical Chest wall movement, Good air movement bilaterally, CTAB RRR,No Gallops,Rubs or new Murmurs, No Parasternal Heave +ve B.Sounds, Abd Soft, No tenderness, No organomegaly appriciated, No rebound - guarding or rigidity. No Cyanosis, Clubbing or edema, No new Rash or bruise L BKA    Data Review:   Micro Results Recent Results (from the past 240 hour(s))  MRSA PCR Screening     Status: None   Collection Time: 11/12/14 12:24 PM  Result Value Ref Range Status   MRSA by PCR NEGATIVE NEGATIVE Final    Comment:        The GeneXpert MRSA Assay (FDA approved for NASAL specimens only), is one component of a comprehensive MRSA colonization surveillance program. It is not intended to diagnose MRSA infection nor to guide or monitor treatment for MRSA infections.   C difficile quick scan w PCR reflex     Status: None   Collection Time: 11/14/14  2:00 PM  Result Value Ref Range Status   C Diff antigen NEGATIVE NEGATIVE Final   C Diff toxin NEGATIVE NEGATIVE Final   C Diff interpretation Negative for toxigenic C. difficile  Final    Radiology Reports Mr Foot Left Wo Contrast  11/10/2014   CLINICAL DATA:  Diabetic foot ulcer.  Redness and swelling. EXAM: MRI OF THE LEFT FOREFOOT WITHOUT CONTRAST TECHNIQUE: Multiplanar, multisequence MR imaging was performed. No intravenous contrast was administered. COMPARISON:  Radiographs dated 11/09/2014 FINDINGS: There is extensive subcutaneous gas on the dorsum of the midfoot. No discrete abscesses. There is no bone destruction in the midfoot although there is edema in the cuneiforms, cuboid, and navicular as well as slight edema in the bases of the metatarsals. There is a chronic Freiberg's infraction of the head of the second metatarsal, avascular necrosis. There is small ankle joint effusion. Visualized muscles and tendons are normal. IMPRESSION: Findings consistent with anaerobic cellulitis of the dorsum of the foot. The edema in the bones of the midfoot are most likely stress reactions with secondary to diabetic neuropathy. The findings are not consistent with avascular necrosis in the midfoot or osteomyelitis. Electronically Signed   By: Lorriane Shire M.D.   On: 11/10/2014 07:37   Dg Chest Port 1 View  11/16/2014  CLINICAL DATA:  Hypoglycemia this morning. Cough. Initial encounter. EXAM: PORTABLE CHEST 1 VIEW COMPARISON:  Single view of the chest 07/08/2014 and 11/12/2014. FINDINGS: The lungs are clear. Heart size is normal. No pneumothorax or pleural effusion. Remote right rib fractures are noted. IMPRESSION: No acute disease. Electronically Signed   By: Inge Rise M.D.   On: 11/16/2014 08:00   Dg Chest Port 1 View  11/12/2014  CLINICAL DATA:  Shortness of Breath EXAM: PORTABLE CHEST 1 VIEW COMPARISON:  July 08, 2014 FINDINGS: There is no edema or consolidation. Heart is mildly enlarged with pulmonary vascularity within normal limits. No adenopathy. There are healed rib fractures bilaterally. IMPRESSION: No edema or consolidation.  Stable cardiac prominence. Electronically Signed   By: Lowella Grip III M.D.   On: 11/12/2014 08:11   Dg  Foot 2 Views Left  11/09/2014  CLINICAL DATA:  Diabetic foot ulcer on the dorsum of the foot and ankle. EXAM: LEFT FOOT - 2 VIEW COMPARISON:  None.  FINDINGS: There is an open wound on the dorsum of the midfoot with extensive subcutaneous emphysema. No obvious destructive bony changes to suggest osteomyelitis. No destructive joint changes to suggest septic arthritis. There is chronic appearing AVN of the second metatarsal head (Freiberg's infraction). IMPRESSION: 1. Open wound on the dorsum of the midfoot with subcutaneous emphysema. 2. No definite plain film findings to suggest septic arthritis or osteomyelitis 3. Chronic AVN of the second metatarsal head. Electronically Signed   By: Marijo Sanes M.D.   On: 11/09/2014 16:20     CBC  Recent Labs Lab 11/13/14 1157 11/14/14 0340 11/15/14 0350 11/15/14 1714 11/16/14 1140 11/17/14 0350 11/18/14 1519 11/19/14 0505 11/20/14 0313  WBC 11.3* 12.3* 12.9* 10.8* 10.1 9.1  --  12.1* 7.5  HGB 8.5* 8.6* 8.1* 9.1* 8.5* 8.1* 12.2 10.3* 9.2*  HCT 27.4* 27.6* 26.2* 28.5* 26.6* 26.7* 36.0 33.5* 30.6*  PLT 459* 478* 582* 536* PLATELET CLUMPS NOTED ON SMEAR, COUNT APPEARS ADEQUATE 471*  --  507* 417*  MCV 86.2 85.7 84.5 82.6 84.2 85.9  --  85.9 86.9  MCH 26.7 26.7 26.1 26.4 26.9 26.0  --  26.4 26.1  MCHC 31.0 31.2 30.9 31.9 32.0 30.3  --  30.7 30.1  RDW 17.6* 17.6* 17.4* 17.5* 17.6* 17.9*  --  17.7* 17.7*  LYMPHSABS 1.8 1.9 2.2  --   --   --   --   --   --   MONOABS 0.7 0.7 0.7  --   --   --   --   --   --   EOSABS 0.5 0.4 0.4  --   --   --   --   --   --   BASOSABS 0.0 0.0 0.0  --   --   --   --   --   --     Chemistries   Recent Labs Lab 11/14/14 0340 11/15/14 0350 11/15/14 1714 11/16/14 0835 11/17/14 0350 11/18/14 1519 11/19/14 0505 11/20/14 0313  NA 133* 137  --  141 138 139 136 136  K 4.2 4.2  --  3.4* 3.7 4.2 3.9 4.1  CL 99* 100*  --  109 105  --  103 102  CO2 26 28  --  25 24  --  24 28  GLUCOSE 220* 125*  --  70 132* 120* 127* 170*   BUN 27* 27*  --  19 14  --  15 15  CREATININE 1.71* 1.74* 1.69* 1.64* 1.62*  --  1.95* 1.92*  CALCIUM 9.0 9.1  --  8.9 8.8*  --  8.7* 8.6*  MG  --   --   --  1.9 1.9  --   --  2.1  AST 11* 12*  --  17  --   --   --   --   ALT 7* 9*  --  9*  --   --   --   --   ALKPHOS 72 80  --  78  --   --   --   --   BILITOT 0.4 0.8  --  0.2*  --   --   --   --    ------------------------------------------------------------------------------------------------------------------ estimated creatinine clearance is 46.9 mL/min (by C-G formula based on Cr of 1.92). ------------------------------------------------------------------------------------------------------------------ No results for input(s): HGBA1C in the last 72 hours. ------------------------------------------------------------------------------------------------------------------ No results for input(s): CHOL, HDL, LDLCALC, TRIG, CHOLHDL, LDLDIRECT in the last 72 hours. ------------------------------------------------------------------------------------------------------------------ No results for input(s): TSH, T4TOTAL, T3FREE, THYROIDAB  in the last 72 hours.  Invalid input(s): FREET3 ------------------------------------------------------------------------------------------------------------------ No results for input(s): VITAMINB12, FOLATE, FERRITIN, TIBC, IRON, RETICCTPCT in the last 72 hours.  Coagulation profile  Recent Labs Lab 11/15/14 0350  INR 1.18    No results for input(s): DDIMER in the last 72 hours.  Cardiac Enzymes No results for input(s): CKMB, TROPONINI, MYOGLOBIN in the last 168 hours.  Invalid input(s): CK ------------------------------------------------------------------------------------------------------------------ Invalid input(s): POCBNP   Time Spent in minutes  35   Loucinda Croy K M.D on 11/20/2014 at 9:59 AM  Between 7am to 7pm - Pager - [phone]  After 7pm go to [email or web address] - password  [facility]  [facility] -  Office  [phone]

## 2014-11-20 NOTE — Progress Notes (Signed)
Physical Therapy Treatment Patient Details Name: [name] MRN: [id number] DOB: 1965-04-13 Today's Date: 11/20/2014    History of Present Illness Pt is a 49 y.o. female with history of HIV, chronic pain, DM type 2 with retinopathy and peripheral neuropathy, left hip and colles fracture 05/2014 with limited mobility, and PVD with left foot ulcer. She was admitted on 11/09/14 with malodorous drainage and gangrenous changes of foot. She underwent L BKA 11-18-14.    PT Comments    Patient agreeable to short therapy session.  Patient education regarding acute status duration of sit vs lying for leg healing, promoted frequent changes in position initially with progressively longer sitting duration based on pain/swelling.  Bed Mobility with MIN Guard, lateral transfer to wheelchair with MOD Assist.  Patient may be able to be trained for commode transfer with drop-arm commode, or with lift equipment at this time, low activity tolerance however.  Patient is progressing, still with severe pain, needs strengthening, transfer training, and progressive standing at this time.  Progress to wheelchair mobility and gait as tolerated.  Follow Up Recommendations  CIR     Equipment Recommendations  None recommended by PT    Recommendations for Other Services Rehab consult     Precautions / Restrictions Precautions Precautions: Fall Restrictions Weight Bearing Restrictions: Yes LLE Weight Bearing: Non weight bearing    Mobility  Bed Mobility Overal bed mobility: Needs Assistance Bed Mobility: Supine to Sit;Sit to Supine     Supine to sit: HOB elevated;Min guard Sit to supine: Min guard   General bed mobility comments: Pt able to transfer from supine to sit elevated HOB with increased time and supervision.  She also transitioned from sitting to supine with supervision as well.  Pt with increased pain in the RLE.   Transfers Overall transfer level: Needs assistance Equipment used:   (Lateral transfer to Wheelchair) Transfers: Public house manager;Lateral/Scoot Transfers     Squat pivot transfers: Mod assist    Lateral/Scoot Transfers: Mod assist;From elevated surface General transfer comment: Rt foot sliding despite non-skid sock, short pivot scoots with cues (3-4" each).  Ambulation/Gait                 Stairs            Wheelchair Mobility    Modified Rankin (Stroke Patients Only)       Balance Overall balance assessment: Needs assistance   Sitting balance-Leahy Scale: Good       Standing balance-Leahy Scale: Zero                      Cognition Arousal/Alertness: Awake/alert Behavior During Therapy: WFL for tasks assessed/performed Overall Cognitive Status: Within Functional Limits for tasks assessed                      Exercises      General Comments General comments (skin integrity, edema, etc.): Left residual limb in shrinker sock.      Pertinent Vitals/Pain Pain Assessment: 0-10 Pain Score: 10-Worst pain ever Pain Location: Left residual limb Pain Descriptors / Indicators: Burning;Sore Pain Intervention(s): Monitored during session    Home Living                      Prior Function            PT Goals (current goals can now be found in the care plan section) Acute Rehab PT Goals Patient Stated Goal: walk with  a prosthesis PT Goal Formulation: With patient Time For Goal Achievement: 12/03/14 Potential to Achieve Goals: Good Progress towards PT goals: Progressing toward goals    Frequency  Min 4X/week    PT Plan Current plan remains appropriate    Co-evaluation             End of Session Equipment Utilized During Treatment: Gait belt Activity Tolerance: Patient tolerated treatment well;Patient limited by pain Patient left: in bed;with call bell/phone within reach     Time: 1340-1410 PT Time Calculation (min) (ACUTE ONLY): 30 min  Charges:  $Therapeutic Activity:  23-37 mins                    G CodesZenia Resides, Wayman Hoard L December 09, 2014, 2:37 PM

## 2014-11-21 LAB — GLUCOSE, CAPILLARY
GLUCOSE-CAPILLARY: 230 mg/dL — AB (ref 65–99)
GLUCOSE-CAPILLARY: 157 mg/dL — AB (ref 65–99)
GLUCOSE-CAPILLARY: 176 mg/dL — AB (ref 65–99)
Glucose-Capillary: 227 mg/dL — ABNORMAL HIGH (ref 65–99)

## 2014-11-21 LAB — TYPE AND SCREEN
ABO/RH(D): O NEG
ANTIBODY SCREEN: NEGATIVE
UNIT DIVISION: 0
UNIT DIVISION: 0
Unit division: 0

## 2014-11-21 MED ORDER — HYDROMORPHONE HCL 2 MG PO TABS: 2.0000 mg | ORAL_TABLET | Freq: Four times a day (QID) | ORAL | Status: DC | PRN

## 2014-11-21 MED ORDER — INSULIN GLARGINE 300 UNIT/ML ~~LOC~~ SOPN: 35.0000 [IU] | PEN_INJECTOR | Freq: Every day | SUBCUTANEOUS | Status: DC

## 2014-11-21 MED ORDER — HYDROCODONE-ACETAMINOPHEN 10-325 MG PO TABS: 1.0000 | ORAL_TABLET | Freq: Four times a day (QID) | ORAL | Status: DC | PRN

## 2014-11-21 MED ORDER — DIPHENHYDRAMINE HCL 50 MG/ML IJ SOLN
12.5000 mg | Freq: Four times a day (QID) | INTRAMUSCULAR | Status: DC | PRN
  Administered 2014-11-21 (×2): 12.5 mg via INTRAVENOUS
  Filled 2014-11-21 (×3): qty 1

## 2014-11-21 MED ORDER — PANTOPRAZOLE SODIUM 40 MG PO TBEC
40.0000 mg | DELAYED_RELEASE_TABLET | Freq: Every day | ORAL | Status: DC
  Administered 2014-11-21 – 2014-11-22 (×2): 40 mg via ORAL
  Filled 2014-11-21: qty 1

## 2014-11-21 NOTE — Care Management Important Message (Signed)
Important Message  Patient Details  Name: [name] MRN: [id number] Date of Birth: 07-19-1965   Medicare Important Message Given:  Yes    [name] 11/21/2014, 8:40 AM

## 2014-11-21 NOTE — Progress Notes (Signed)
Patient Demographics:    [name], is a 49 y.o. female, DOB - [birth year], [id number]  Admit date - 11/09/2014   Admitting Physician Erin Reasons, MD  Outpatient Primary MD for the patient is [name]  LOS - 12  Brief summary  Patient admitted on 11/09/2014 [facility] for nonhealing left foot ulcer, patient was started on vancomycin and Fortaz, orthopedic was consulted and currently we're following, MRI shows anaerobic cellulitis with probable subcutaneous emphysema. Orthopedic recommends plastic surgery consult, plastic surgery with attempted debridement but requested vascular surgery consultation.  She was subsequently transferred on 11/16/2014 to [facility] for left foot wound debridement and wound VAC placement by plastic surgery and also underwent L.SFA stent, she is now due for left BKA on 11/18/2014.    Chief Complaint  Patient presents with  . Wound Infection        Subjective:    [name] today has, No headache, No chest pain, No abdominal pain - No Nausea, No new weakness tingling or numbness, No Cough - SOB. Left stump pain has considerably improved.   Assessment  & Plan :     1. Diabetic left foot infection and gangrene with underlying PAD - MRI shows no evidence of osteomyelitis however clinically there is cellulitis, orthopedic surgery, plastic surgery and vascular surgery on board.   Patient underwent foot wound debridement with wound VAC placement on 11/16/2014, had L Leg L SFA stent 11-14-14 by Dr Bridgett Larsson on 11-14-14 & finally left BKA by Ortho on 11-18-14, stopped antibiotics on 11-19-14 which were vancomycin and Zosyn, PT and placement initiated. Likely CIR on Monday.  Minimal perioperative blood loss rated anemia not requiring  transfusion.   2. Chronic hypotension worsened by dehydration and possible sepsis early during the admission. Antihypertensive medications and Lasix have been held, blood pressure has stabilized after IV fluids will monitor. TSH and cortisol are stable.   3. HIV. Follows with Dr. Johnnye Sima. CD4 count 570 in late October 2016. Continue home medications for now.   4. Dyslipidemia. On statin continue.   5. Depression. Continue Abilify and Lexapro. Klonopin as needed for anxiety.   6. CK D4 Baseline creatinine is around 1.9. Monitor.    7.Chronic diastolic dysfunction with EF 60% on this admission. Compensated.   8. DM type II. Currently on Lantus and sliding scale dose .   Lab Results  Component Value Date   HGBA1C 10.7* 11/10/2014    CBG (last 3)   Recent Labs  11/20/14 1640 11/20/14 2120 11/21/14 0738  GLUCAP 223* 189* 230*       Code Status : Full  Family Communication  : None present  Disposition Plan  : CIR versus SNF in the next 1-2 days  Consults  :    Orthopedics Plastic surgery  Vascular surgery  Procedures  :   TTE - chronic grade 1 diastolic dysfunction, EF 123456.  ABI left foot severe disease with ABI of 0.53, right leg mild arterial disease with ABI of 0.91.  L Leg L SFA stent 11-14-14 by Dr Bridgett Larsson  L foot wound debridement and wound VAC placement on 11/16/2014 by plastic surgery  Left BKA 11-18-14  DVT Prophylaxis  : Heparin   Lab Results  Component  Value Date   PLT 417* 11/20/2014    Inpatient Medications  Scheduled Meds: . ARIPiprazole  10 mg Oral Daily  . aspirin EC  325 mg Oral Daily  . budesonide-formoterol  2 puff Inhalation BID  . escitalopram  20 mg Oral Daily  . feeding supplement (GLUCERNA SHAKE)  237 mL Oral TID BM  . heparin subcutaneous  5,000 Units Subcutaneous 3 times per day  . insulin aspart  0-15 Units Subcutaneous TID WC  . insulin aspart  0-5 Units Subcutaneous QHS  . insulin glargine  35 Units Subcutaneous  QPC supper  . methylphenidate  20 mg Oral Daily  . multivitamin with minerals  1 tablet Oral Daily  . pregabalin  150 mg Oral BID  . rosuvastatin  20 mg Oral Daily  . saccharomyces boulardii  250 mg Oral BID  . tamsulosin  0.4 mg Oral QPC supper   Continuous Infusions:   PRN Meds:.acetaminophen, clonazePAM, cyclobenzaprine, HYDROmorphone (DILAUDID) injection, ipratropium-albuterol, loperamide, ondansetron (ZOFRAN) IV, oxyCODONE-acetaminophen  Antibiotics  :     Anti-infectives    Start     Dose/Rate Route Frequency Ordered Stop   11/12/14 2200  vancomycin (VANCOCIN) IVPB 1000 mg/200 mL premix     1,000 mg 200 mL/hr over 60 Minutes Intravenous Every 24 hours 11/12/14 1956 11/19/14 2302   11/10/14 2000  vancomycin (VANCOCIN) 1,250 mg in sodium chloride 0.9 % 250 mL IVPB  Status:  Discontinued     1,250 mg 166.7 mL/hr over 90 Minutes Intravenous Every 24 hours 11/10/14 0212 11/12/14 1954   11/10/14 0800  emtricitabine-rilpivir-tenofovir AF (ODEFSEY) 200-25-25 MG per tablet 1 tablet  Status:  Discontinued     1 tablet Oral Daily with breakfast 11/09/14 1837 11/10/14 0144   11/10/14 0800  emtricitabine-tenofovir AF (DESCOVY) 200-25 MG per tablet 1 tablet  Status:  Discontinued     1 tablet Oral Daily with breakfast 11/10/14 0144 11/19/14 1211   11/10/14 0800  rilpivirine (EDURANT) tablet 25 mg  Status:  Discontinued     25 mg Oral Daily with breakfast 11/10/14 0144 11/19/14 1211   11/10/14 0215  piperacillin-tazobactam (ZOSYN) IVPB 3.375 g     3.375 g 12.5 mL/hr over 240 Minutes Intravenous Every 8 hours 11/10/14 0208 11/19/14 2359   11/10/14 0215  vancomycin (VANCOCIN) IVPB 1000 mg/200 mL premix     1,000 mg 200 mL/hr over 60 Minutes Intravenous  Once 11/10/14 0209 11/10/14 0441   11/09/14 1545  vancomycin (VANCOCIN) IVPB 1000 mg/200 mL premix     1,000 mg 200 mL/hr over 60 Minutes Intravenous  Once 11/09/14 1530 11/09/14 1855   11/09/14 1530  piperacillin-tazobactam (ZOSYN) IVPB  3.375 g     3.375 g 12.5 mL/hr over 240 Minutes Intravenous  Once 11/09/14 1530 11/09/14 1758        Objective:   Filed Vitals:   11/20/14 2100 11/20/14 2356 11/21/14 0500 11/21/14 0845  BP: 124/56 97/45 120/58 113/54  Pulse: 90 95 82 77  Temp: 99.1 F (37.3 C) 99.5 F (37.5 C) 99.5 F (37.5 C) 98.4 F (36.9 C)  TempSrc: Oral Oral Oral Oral  Resp: 20   16  Height:      Weight:   105.915 kg (233 lb 8 oz)   SpO2: 98% 93% 96% 94%    Wt Readings from Last 3 Encounters:  11/21/14 105.915 kg (233 lb 8 oz)  11/09/14 110.224 kg (243 lb)  07/08/14 110.224 kg (243 lb)     Intake/Output Summary (Last 24 hours)  at 11/21/14 0917 Last data filed at 11/21/14 0648  Gross per 24 hour  Intake    750 ml  Output   2650 ml  Net  -1900 ml     Physical Exam  Awake Alert, Oriented X 3, No new F.N deficits, Normal affect [place].AT,PERRAL Supple Neck,No JVD, No cervical lymphadenopathy appriciated.  Symmetrical Chest wall movement, Good air movement bilaterally, CTAB RRR,No Gallops,Rubs or new Murmurs, No Parasternal Heave +ve B.Sounds, Abd Soft, No tenderness, No organomegaly appriciated, No rebound - guarding or rigidity. No Cyanosis, Clubbing or edema, No new Rash or bruise L BKA    Data Review:   Micro Results Recent Results (from the past 240 hour(s))  MRSA PCR Screening     Status: None   Collection Time: 11/12/14 12:24 PM  Result Value Ref Range Status   MRSA by PCR NEGATIVE NEGATIVE Final    Comment:        The GeneXpert MRSA Assay (FDA approved for NASAL specimens only), is one component of a comprehensive MRSA colonization surveillance program. It is not intended to diagnose MRSA infection nor to guide or monitor treatment for MRSA infections.   C difficile quick scan w PCR reflex     Status: None   Collection Time: 11/14/14  2:00 PM  Result Value Ref Range Status   C Diff antigen NEGATIVE NEGATIVE Final   C Diff toxin NEGATIVE NEGATIVE Final   C Diff  interpretation Negative for toxigenic C. difficile  Final    Radiology Reports Mr Foot Left Wo Contrast  11/10/2014  CLINICAL DATA:  Diabetic foot ulcer.  Redness and swelling. EXAM: MRI OF THE LEFT FOREFOOT WITHOUT CONTRAST TECHNIQUE: Multiplanar, multisequence MR imaging was performed. No intravenous contrast was administered. COMPARISON:  Radiographs dated 11/09/2014 FINDINGS: There is extensive subcutaneous gas on the dorsum of the midfoot. No discrete abscesses. There is no bone destruction in the midfoot although there is edema in the cuneiforms, cuboid, and navicular as well as slight edema in the bases of the metatarsals. There is a chronic Freiberg's infraction of the head of the second metatarsal, avascular necrosis. There is small ankle joint effusion. Visualized muscles and tendons are normal. IMPRESSION: Findings consistent with anaerobic cellulitis of the dorsum of the foot. The edema in the bones of the midfoot are most likely stress reactions with secondary to diabetic neuropathy. The findings are not consistent with avascular necrosis in the midfoot or osteomyelitis. Electronically Signed   By: Lorriane Shire M.D.   On: 11/10/2014 07:37   Dg Chest Port 1 View  11/16/2014  CLINICAL DATA:  Hypoglycemia this morning. Cough. Initial encounter. EXAM: PORTABLE CHEST 1 VIEW COMPARISON:  Single view of the chest 07/08/2014 and 11/12/2014. FINDINGS: The lungs are clear. Heart size is normal. No pneumothorax or pleural effusion. Remote right rib fractures are noted. IMPRESSION: No acute disease. Electronically Signed   By: Inge Rise M.D.   On: 11/16/2014 08:00   Dg Chest Port 1 View  11/12/2014  CLINICAL DATA:  Shortness of Breath EXAM: PORTABLE CHEST 1 VIEW COMPARISON:  July 08, 2014 FINDINGS: There is no edema or consolidation. Heart is mildly enlarged with pulmonary vascularity within normal limits. No adenopathy. There are healed rib fractures bilaterally. IMPRESSION: No edema or  consolidation.  Stable cardiac prominence. Electronically Signed   By: Lowella Grip III M.D.   On: 11/12/2014 08:11   Dg Foot 2 Views Left  11/09/2014  CLINICAL DATA:  Diabetic foot ulcer on the dorsum of  the foot and ankle. EXAM: LEFT FOOT - 2 VIEW COMPARISON:  None. FINDINGS: There is an open wound on the dorsum of the midfoot with extensive subcutaneous emphysema. No obvious destructive bony changes to suggest osteomyelitis. No destructive joint changes to suggest septic arthritis. There is chronic appearing AVN of the second metatarsal head (Freiberg's infraction). IMPRESSION: 1. Open wound on the dorsum of the midfoot with subcutaneous emphysema. 2. No definite plain film findings to suggest septic arthritis or osteomyelitis 3. Chronic AVN of the second metatarsal head. Electronically Signed   By: Marijo Sanes M.D.   On: 11/09/2014 16:20     CBC  Recent Labs Lab 11/15/14 0350 11/15/14 1714 11/16/14 1140 11/17/14 0350 11/18/14 1519 11/19/14 0505 11/20/14 0313  WBC 12.9* 10.8* 10.1 9.1  --  12.1* 7.5  HGB 8.1* 9.1* 8.5* 8.1* 12.2 10.3* 9.2*  HCT 26.2* 28.5* 26.6* 26.7* 36.0 33.5* 30.6*  PLT 582* 536* PLATELET CLUMPS NOTED ON SMEAR, COUNT APPEARS ADEQUATE 471*  --  507* 417*  MCV 84.5 82.6 84.2 85.9  --  85.9 86.9  MCH 26.1 26.4 26.9 26.0  --  26.4 26.1  MCHC 30.9 31.9 32.0 30.3  --  30.7 30.1  RDW 17.4* 17.5* 17.6* 17.9*  --  17.7* 17.7*  LYMPHSABS 2.2  --   --   --   --   --   --   MONOABS 0.7  --   --   --   --   --   --   EOSABS 0.4  --   --   --   --   --   --   BASOSABS 0.0  --   --   --   --   --   --     Chemistries   Recent Labs Lab 11/15/14 0350 11/15/14 1714 11/16/14 0835 11/17/14 0350 11/18/14 1519 11/19/14 0505 11/20/14 0313  NA 137  --  141 138 139 136 136  K 4.2  --  3.4* 3.7 4.2 3.9 4.1  CL 100*  --  109 105  --  103 102  CO2 28  --  25 24  --  24 28  GLUCOSE 125*  --  70 132* 120* 127* 170*  BUN 27*  --  19 14  --  15 15  CREATININE 1.74* 1.69*  1.64* 1.62*  --  1.95* 1.92*  CALCIUM 9.1  --  8.9 8.8*  --  8.7* 8.6*  MG  --   --  1.9 1.9  --   --  2.1  AST 12*  --  17  --   --   --   --   ALT 9*  --  9*  --   --   --   --   ALKPHOS 80  --  78  --   --   --   --   BILITOT 0.8  --  0.2*  --   --   --   --    ------------------------------------------------------------------------------------------------------------------ estimated creatinine clearance is 46.2 mL/min (by C-G formula based on Cr of 1.92). ------------------------------------------------------------------------------------------------------------------ No results for input(s): HGBA1C in the last 72 hours. ------------------------------------------------------------------------------------------------------------------ No results for input(s): CHOL, HDL, LDLCALC, TRIG, CHOLHDL, LDLDIRECT in the last 72 hours. ------------------------------------------------------------------------------------------------------------------ No results for input(s): TSH, T4TOTAL, T3FREE, THYROIDAB in the last 72 hours.  Invalid input(s): FREET3 ------------------------------------------------------------------------------------------------------------------ No results for input(s): VITAMINB12, FOLATE, FERRITIN, TIBC, IRON, RETICCTPCT in the last 72 hours.  Coagulation profile  Recent Labs Lab  11/15/14 0350  INR 1.18    No results for input(s): DDIMER in the last 72 hours.  Cardiac Enzymes No results for input(s): CKMB, TROPONINI, MYOGLOBIN in the last 168 hours.  Invalid input(s): CK ------------------------------------------------------------------------------------------------------------------ Invalid input(s): POCBNP   Time Spent in minutes  35   Xzavien Harada K M.D on 11/21/2014 at 9:17 AM  Between 7am to 7pm - Pager - [phone]  After 7pm go to [email or web address] - password [facility]  [facility] -  Office  [phone]

## 2014-11-22 LAB — GLUCOSE, CAPILLARY
GLUCOSE-CAPILLARY: 212 mg/dL — AB (ref 65–99)
GLUCOSE-CAPILLARY: 230 mg/dL — AB (ref 65–99)
GLUCOSE-CAPILLARY: 205 mg/dL — AB (ref 65–99)

## 2014-11-22 MED ORDER — CLONAZEPAM 0.5 MG PO TABS: 0.2500 mg | ORAL_TABLET | Freq: Three times a day (TID) | ORAL | Status: DC | PRN

## 2014-11-22 MED ORDER — DOCUSATE SODIUM 100 MG PO CAPS: 100.0000 mg | ORAL_CAPSULE | Freq: Two times a day (BID) | ORAL | Status: DC | PRN

## 2014-11-22 MED ORDER — HYDROMORPHONE HCL 1 MG/ML IJ SOLN: 0.5000 mg | INTRAMUSCULAR | Status: AC | PRN

## 2014-11-22 MED ORDER — CYCLOBENZAPRINE HCL 5 MG PO TABS: 10.0000 mg | ORAL_TABLET | Freq: Three times a day (TID) | ORAL | Status: DC | PRN

## 2014-11-22 MED ORDER — CYCLOBENZAPRINE HCL 5 MG PO TABS
7.5000 mg | ORAL_TABLET | Freq: Two times a day (BID) | ORAL | Status: DC | PRN
  Filled 2014-11-22 (×2): qty 1.5

## 2014-11-22 MED ORDER — ASPIRIN 325 MG PO TBEC: 325.0000 mg | DELAYED_RELEASE_TABLET | Freq: Every day | ORAL | Status: DC

## 2014-11-22 MED ORDER — OXYCODONE-ACETAMINOPHEN 5-325 MG PO TABS
1.0000 | ORAL_TABLET | ORAL | Status: DC | PRN
  Administered 2014-11-22 (×2): 1 via ORAL
  Filled 2014-11-22 (×3): qty 1

## 2014-11-22 NOTE — Progress Notes (Signed)
Physical Therapy Treatment Patient Details Name: [name] MRN: [id number] DOB: 09-22-1965 Today's Date: 11/22/2014    History of Present Illness Pt is a 49 y.o. female with history of HIV, chronic pain, DM type 2 with retinopathy and peripheral neuropathy, left hip and colles fracture 05/2014 with limited mobility, and PVD with left foot ulcer. She was admitted on 11/09/14 with malodorous drainage and gangrenous changes of foot. She underwent L BKA 11-18-14.    PT Comments    Patient initailly anxious and nervous about mobility but she was able to transfer well with cues and +2 for safety purposes. Unsure if patient will get approval from [facility] for CIR today and if patient does not she will go home this evening. Patient will need 24/7 assistance which her mother is providing. Patient will also require a ambulance transfer home as she is unable to transfer to a car and has 3 steps to enter the house.   Follow Up Recommendations  CIR;Home health PT (If patient does not get rehab approval from insurance she wil need HHPT)     Equipment Recommendations  None recommended by PT    Recommendations for Other Services Rehab consult     Precautions / Restrictions Precautions Precautions: Fall Restrictions LLE Weight Bearing: Non weight bearing    Mobility  Bed Mobility Overal bed mobility: Needs Assistance       Supine to sit: Min guard;Supervision Sit to supine: Supervision   General bed mobility comments: Cues for positioning. No assistance required. Increased effort for back into bed with use of rail  Transfers Overall transfer level: Needs assistance   Transfers: Sit to/from Stand;Stand Pivot Transfers Sit to Stand: Min assist;+2 safety/equipment Stand pivot transfers: Min assist;+2 safety/equipment       General transfer comment: Intial stand was with OT using stedy then 3 stand performed using RW with cues for hand placement and anterior weight shift. Patient  able to SPT with min A and cues. One LOB requiring Mod A for control and balance. Patient very anxious but performed well.   Ambulation/Gait             General Gait Details: unable   Stairs            Wheelchair Mobility    Modified Rankin (Stroke Patients Only)       Balance     Sitting balance-Leahy Scale: Good       Standing balance-Leahy Scale: Poor                      Cognition Arousal/Alertness: Awake/alert Behavior During Therapy: WFL for tasks assessed/performed Overall Cognitive Status: Within Functional Limits for tasks assessed Area of Impairment: Safety/judgement         Safety/Judgement: Decreased awareness of safety     General Comments: General safety concerns as patient states there are things that she will be able to do but questionable due to how little mobility she performs with therapy    Exercises      General Comments        Pertinent Vitals/Pain Pain Assessment: No/denies pain    Home Living   Living Arrangements: Parent;Other (Comment) (lives with her Mom who is an amputee)                  Prior Function            PT Goals (current goals can now be found in the care plan section) Progress towards PT  goals: Progressing toward goals    Frequency  Min 4X/week    PT Plan Current plan remains appropriate    Co-evaluation PT/OT/SLP Co-Evaluation/Treatment: Yes Reason for Co-Treatment: Complexity of the patient's impairments (multi-system involvement);Necessary to address cognition/behavior during functional activity;For patient/therapist safety PT goals addressed during session: Mobility/safety with mobility;Balance;Proper use of DME       End of Session   Activity Tolerance: Patient tolerated treatment well Patient left: in bed;with call bell/phone within reach     Time: 1310-1340 PT Time Calculation (min) (ACUTE ONLY): 30 min  Charges:  $Therapeutic Activity: 8-22 mins                     G Codes:      Jacqualyn Posey 11/22/2014, 2:18 PM 11/22/2014 Kollyns Mickelson, Tonia Brooms PTA

## 2014-11-22 NOTE — Progress Notes (Signed)
I have not received [facility] insurance decision for an inpt rehab admission. RN CM therfore has arranged for pt to be discharged home today via ambulance for she will not agree to SNF rehab at this time. [id number]

## 2014-11-22 NOTE — Progress Notes (Signed)
I met with pt at bedside to discuss a possible inpt rehab admission. Pt has been up once up in wheelchair with therapy on 11/19. I will open case with [facility] Medicare for possible admit today. Noted d/c medically ready. Pt states if she can not be admitted to [facility] inpt rehab, she will not agree to SNF rehab. She would then only agree to [facility]. I recommended to her that she consider SNF rehab. I discussed with RN CM. [phone]

## 2014-11-22 NOTE — Progress Notes (Signed)
Occupational Therapy Treatment Patient Details Name: [name] MRN: [id number] DOB: 1965/12/16 Today's Date: 11/22/2014    History of present illness Pt is a 49 y.o. female with history of HIV, chronic pain, DM type 2 with retinopathy and peripheral neuropathy, left hip and colles fracture 05/2014 with limited mobility, and PVD with left foot ulcer. She was admitted on 11/09/14 with malodorous drainage and gangrenous changes of foot. She underwent L BKA 11-18-14.   OT comments  Pt demonstrates improved pain and activity tolerance today.  She is very motivated to improve and regain independence.   She was able to perform functional transfers with min A+2.   Recommend CIR to allow her to return home at mod I level with her mother.  Pt is not safe to discharge home without post acute rehab, and is at high risk for fall and readmission.  Her mother has also had a BKA, and is limited on how much assist she is able to provide pt.   Do feel, pt will progress well with CIR which would allow her to safely return home at a modified independent level.    Follow Up Recommendations  CIR    Equipment Recommendations  None recommended by OT (Pt has all DME )    Recommendations for Other Services Rehab consult    Precautions / Restrictions Precautions Precautions: Fall Restrictions LLE Weight Bearing: Non weight bearing       Mobility Bed Mobility Overal bed mobility: Needs Assistance Bed Mobility: Supine to Sit;Sit to Supine     Supine to sit: Min guard Sit to supine: Supervision   General bed mobility comments: Cues for positioning. No assistance required. Increased effort for back into bed with use of rail  Transfers Overall transfer level: Needs assistance Equipment used: Rolling walker (2 wheeled);Ambulation equipment used Transfers: Sit to/from [facility] Sit to Stand: Min assist;+2 safety/equipment Stand pivot transfers: Min assist;+2 safety/equipment        General transfer comment: Intial stand was with OT using stedy then 3 stand performed using RW with cues for hand placement and anterior weight shift. Patient able to SPT with min A and cues. One LOB requiring Mod A for control and balance. Patient very anxious but performed well.     Balance Overall balance assessment: Needs assistance Sitting-balance support: Feet supported Sitting balance-Leahy Scale: Good     Standing balance support: During functional activity;Bilateral upper extremity supported Standing balance-Leahy Scale: Poor Standing balance comment: requires bil. UE support                    ADL Overall ADL's : Needs assistance/impaired                         Toilet Transfer: Minimal assistance;+2 for safety/equipment;Stand-pivot;BSC;RW Toilet Transfer Details (indicate cue type and reason): Pt initially transferred <> BSC with stedy and min A.          Functional mobility during ADLs: Minimal assistance;+2 for safety/equipment;Rolling walker General ADL Comments: Pt very nervous with transfers. Discussed safety precautions with pt and that she should only attempt stand pivot transfers with assist.   Also discussed need for ambulance transfer if pt discharges home.       Vision                     Perception     Praxis      Cognition   Behavior During Therapy: Anxious  Overall Cognitive Status: Within Functional Limits for tasks assessed Area of Impairment: Safety/judgement          Safety/Judgement: Decreased awareness of safety     General Comments: General safety concerns as patient states there are things that she will be able to do but questionable due to how little mobility she performs with therapy    Extremity/Trunk Assessment               Exercises     Shoulder Instructions       General Comments      Pertinent Vitals/ Pain       Pain Assessment: Faces Faces Pain Scale: Hurts even more Pain  Location: peri area due to diarrhea Pain Descriptors / Indicators: Burning Pain Intervention(s): Monitored during session  Home Living   Living Arrangements: Parent;Other (Comment) (lives with her Mom who is an amputee)                       Bathroom Accessibility: Yes How Accessible: Accessible via walker        Lives With: Family    Prior Functioning/Environment              Frequency Min 2X/week     Progress Toward Goals  OT Goals(current goals can now be found in the care plan section)  Progress towards OT goals: Progressing toward goals  Acute Rehab OT Goals Patient Stated Goal: walk with a prosthesis ADL Goals Pt Will Perform Lower Body Bathing: with mod assist;sit to/from stand;with adaptive equipment Pt Will Perform Lower Body Dressing: with mod assist;sit to/from stand;with adaptive equipment Pt Will Transfer to Toilet: with mod assist;bedside commode;squat pivot transfer Pt Will Perform Toileting - Clothing Manipulation and hygiene: with mod assist;sit to/from stand  Plan Discharge plan remains appropriate    Co-evaluation    PT/OT/SLP Co-Evaluation/Treatment: Yes Reason for Co-Treatment: For patient/therapist safety PT goals addressed during session: Mobility/safety with mobility;Balance;Proper use of DME OT goals addressed during session: ADL's and self-care      End of Session Equipment Utilized During Treatment: Rolling walker;Other (comment) (stedy)   Activity Tolerance Patient tolerated treatment well   Patient Left in bed;with call bell/phone within reach;with bed alarm set   Nurse Communication Mobility status        Time: [id number] OT Time Calculation (min): 51 min  Charges: OT General Charges $OT Visit: 1 Procedure OT Treatments $Self Care/Home Management : 8-22 mins $Therapeutic Activity: 8-22 mins  Immaculate Crutcher M 11/22/2014, 3:00 PM

## 2014-11-22 NOTE — Discharge Instructions (Signed)
Keep dressings on until follow up appointment.  Follow with Primary MD SPENCER,SARA C, PA-C in 3-4 days   Get CBC, CMP, 2 view Chest X ray checked  by Primary MD next visit.    Activity: As tolerated with Full fall precautions use walker/cane & assistance as needed   Disposition SNF/CIR   Diet: Heart Healthy low Carb  Accuchecks 4 times/day, Once in AM empty stomach and then before each meal. Log in all results and show them to your Prim.MD in 3 days. If any glucose reading is under 80 or above 300 call your Prim MD immidiately. Follow Low glucose instructions for glucose under 80 as instructed.  For Heart failure patients - Check your Weight same time everyday, if you gain over 2 pounds, or you develop in leg swelling, experience more shortness of breath or chest pain, call your Primary MD immediately. Follow Cardiac Low Salt Diet and 1.5 lit/day fluid restriction.   On your next visit with your primary care physician please Get Medicines reviewed and adjusted.   Please request your Prim.MD to go over all Hospital Tests and Procedure/Radiological results at the follow up, please get all Hospital records sent to your Prim MD by signing hospital release before you go home.   If you experience worsening of your admission symptoms, develop shortness of breath, life threatening emergency, suicidal or homicidal thoughts you must seek medical attention immediately by calling 911 or calling your MD immediately  if symptoms less severe.  You Must read complete instructions/literature along with all the possible adverse reactions/side effects for all the Medicines you take and that have been prescribed to you. Take any new Medicines after you have completely understood and accpet all the possible adverse reactions/side effects.   Do not drive, operating heavy machinery, perform activities at heights, swimming or participation in water activities or provide baby sitting services if your were  admitted for syncope or siezures until you have seen by Primary MD or a Neurologist and advised to do so again.  Do not drive when taking Pain medications.    Do not take more than prescribed Pain, Sleep and Anxiety Medications  Special Instructions: If you have smoked or chewed Tobacco  in the last 2 yrs please stop smoking, stop any regular Alcohol  and or any Recreational drug use.  Wear Seat belts while driving.   Please note  You were cared for by a hospitalist during your hospital stay. If you have any questions about your discharge medications or the care you received while you were in the hospital after you are discharged, you can call the unit and asked to speak with the hospitalist on call if the hospitalist that took care of you is not available. Once you are discharged, your primary care physician will handle any further medical issues. Please note that NO REFILLS for any discharge medications will be authorized once you are discharged, as it is imperative that you return to your primary care physician (or establish a relationship with a primary care physician if you do not have one) for your aftercare needs so that they can reassess your need for medications and monitor your lab values.

## 2014-11-22 NOTE — Discharge Summary (Signed)
[name], is a 49 y.o. female  DOB 1965/07/05  MRN [id number].  Admission date:  11/09/2014  Admitting Physician  Florencia Reasons, MD  Discharge Date:  11/22/2014   Primary MD  Aura Dials, PA-C  Recommendations for primary care physician for things to follow:   Monitor CBC, BMP, blood pressure and diuretic dose closely. Avoid oversedation with narcotics and benzodiazepines.   Admission Diagnosis  Wound, open, foot [S91.309A]   Discharge Diagnosis  Wound, open, foot [S91.309A]    Principal Problem:   Diabetic foot infection ([place]) Active Problems:   Human immunodeficiency virus (HIV) disease ([place])   Diabetes mellitus type 2 with complications, uncontrolled ([place])   Dyslipidemia   Mood disorder ([place])   Essential hypertension   Diastolic dysfunction   Diabetic neuropathy ([place])   Chronic kidney disease (CKD), stage IV (severe) ([place])      Past Medical History  Diagnosis Date  . Anxiety   . Depression   . Hypertension   . HIV infection ([place]) 2000  . Asthma     HOSPITALIZED WITH EXCERBATION OF ASTHMA - AND BRONCHITIS AND THE FLU DEC 2013  . Shortness of breath     ALLERGIES ARE "ACTING UP"  . Diabetes mellitus     ON INSULIN AND ORAL MEDICATIONS  . GERD (gastroesophageal reflux disease)   . Neuropathy     NEUROPATHY HANDS AND FEET  . Arthritis     LEFT HIP  . Pain     SEVERE PAIN LEFT HIP - HX OF LEFT HIP PINNING JULY 2013    Past Surgical History  Procedure Laterality Date  . Hip pinning,cannulated  07/16/2011    Procedure: CANNULATED HIP PINNING;  Surgeon: Gearlean Alf, MD;  Location: [facility];  Service: Orthopedics;  Laterality: Left;  . Hip pinning,cannulated Left 04/09/2012    Procedure: CANNULATED HIP PINNING AND HARDWARE REVISION;  Surgeon: Gearlean Alf, MD;  Location: [facility];   Service: Orthopedics;  Laterality: Left;  . Hip arthroplasty Left 05/05/2014    Procedure: ARTHROPLASTY OPEN REDUCTION INTERNAL FIXATION LEFT HIP AND REMOVAL OF TWO CANNULATED SCREW;  Surgeon: Latanya Maudlin, MD;  Location: [facility];  Service: Orthopedics;  Laterality: Left;  . Cast application Left [id number]    Procedure: CAST APPLICATION (FIBERGLASS);  Surgeon: Latanya Maudlin, MD;  Location: [facility];  Service: Orthopedics;  Laterality: Left;  CLOSED REDUCTION OF LEFT COLLES FRACTURE WITH SHORT ARM CAST  . Hardware removal Left 05/05/2014    Procedure: HARDWARE REMOVAL LEFT HIP;  Surgeon: Latanya Maudlin, MD;  Location: [facility];  Service: Orthopedics;  Laterality: Left;  REMOVAL BIOMET 6.5-8.0 CANNULATED SCREW  . Peripheral vascular catheterization N/A 11/15/2014    Procedure: Abdominal Aortogram;  Surgeon: Conrad Tuckerman, MD;  Location: [facility];  Service: Cardiovascular;  Laterality: N/A;  . Peripheral vascular catheterization  11/15/2014    Procedure: Lower Extremity Angiography;  Surgeon: Conrad , MD;  Location: [facility];  Service: Cardiovascular;;  . Peripheral vascular catheterization Left 11/15/2014  Procedure: Peripheral Vascular Intervention;  Surgeon: Conrad Sparks, MD;  Location: [facility];  Service: Cardiovascular;  Laterality: Left;  popliteal artery stenting  . I&d extremity Left 11/16/2014    Procedure:  DEBRIDEMENT OF LEFT FOOT POSSIBLE APPLICATION OF INTEGRIA AND VAC ;  Surgeon: Irene Limbo, MD;  Location: [facility];  Service: Plastics;  Laterality: Left;  . Amputation Left 11/18/2014    Procedure: LEFT BELOW KNEE AMPUTATION;  Surgeon: Leandrew Koyanagi, MD;  Location: [facility];  Service: Orthopedics;  Laterality: Left;       HPI  from the history and physical done on the day of admission:    Patient admitted on 11/09/2014 [facility] for nonhealing left foot ulcer, patient was started on vancomycin and Fortaz, orthopedic was consulted and currently  we're following, MRI shows anaerobic cellulitis with probable subcutaneous emphysema. Orthopedic recommends plastic surgery consult, plastic surgery with attempted debridement but requested vascular surgery consultation.  She was subsequently transferred on 11/16/2014 to [facility] for left foot wound debridement and wound VAC placement by plastic surgery and also underwent L.SFA stent, she eventually underwent left BKA on 11/18/2014.     Hospital Course:     1. Diabetic left foot infection and gangrene with underlying PAD - MRI shows no evidence of osteomyelitis however clinically there is cellulitis, orthopedic surgery, plastic surgery and vascular surgery on board.   Patient underwent foot wound debridement with wound VAC placement on 11/16/2014, had L Leg L SFA stent 11-14-14 by Dr Bridgett Larsson on 11-14-14 & finally left BKA by Ortho on 11-18-14, stopped antibiotics on 11-19-14 which were vancomycin and Zosyn, PT and placement initiated. He be discharged for further physical therapy to CIR or to SNF today.  Minimal perioperative blood loss rated anemia not requiring transfusion.   2. Chronic hypotension worsened by dehydration and possible sepsis early during the admission. Antihypertensive medications and Lasix have been were held, blood pressure has stabilized after IV fluids will monitor. TSH and cortisol are stable. Have discontinued Lopressor, Lasix and potassium to be given if pressures are better, avoid oversedation with muscle relaxants, narcotics and benzodiazepines which tend to drop her blood pressures further. She is asymptomatic with systolic in mid 0000000 to low 100s.   3. HIV. Follows with Dr. Johnnye Sima. CD4 count 570 in late October 2016. Continue home medications for now.   4. Dyslipidemia. On statin continue.   5. Depression. Continue Abilify and Lexapro. Klonopin as needed for anxiety.   6. CK D4 Baseline creatinine is around 1.9. Monitor.    7.Chronic diastolic  dysfunction with EF 60% on this admission. Compensated.   8. DM type II. Currently on Lantus and sliding scale dose continue, monitor CBGs every before meals and at bedtime and adjust dose as needed.      Discharge Condition: Stable  Follow UP  Follow-up Information    Follow up with Adele Barthel, MD In 3 months.   Specialties:  Vascular Surgery, Cardiology   Why:  Office will call you to arrange your appt (sent)   Contact information:   [place] [address] [phone]       Follow up with Marianna Payment, MD In 1 week.   Specialty:  Orthopedic Surgery   Why:  For suture removal, For wound re-check   Contact information:   [address] [place] [address] [phone]       Follow up with SPENCER,SARA C, PA-C. Schedule an appointment as soon  as possible for a visit in 1 week.   Specialty:  Physician Assistant   Contact information:   [place] [address] [facility] [facility]  Vascular surgery  Diet and Activity recommendation: See Discharge Instructions below  Discharge Instructions       Discharge Instructions    Discharge instructions    Complete by:  As directed   Keep dressings on until follow up appointment.  Follow with Primary MD SPENCER,SARA C, PA-C in 3-4 days   Get CBC, CMP, 2 view Chest X ray checked  by Primary MD next visit.    Activity: As tolerated with Full fall precautions use walker/cane & assistance as needed   Disposition SNF/CIR   Diet: Heart Healthy low Carb  Accuchecks 4 times/day, Once in AM empty stomach and then before each meal. Log in all results and show them to your Prim.MD in 3 days. If any glucose reading is under 80 or above 300 call your Prim MD immidiately. Follow Low glucose instructions for glucose under 80 as instructed.  For Heart failure patients - Check your Weight same time everyday, if you gain over 2  pounds, or you develop in leg swelling, experience more shortness of breath or chest pain, call your Primary MD immediately. Follow Cardiac Low Salt Diet and 1.5 lit/day fluid restriction.   On your next visit with your primary care physician please Get Medicines reviewed and adjusted.   Please request your Prim.MD to go over all Hospital Tests and Procedure/Radiological results at the follow up, please get all Hospital records sent to your Prim MD by signing hospital release before you go home.   If you experience worsening of your admission symptoms, develop shortness of breath, life threatening emergency, suicidal or homicidal thoughts you must seek medical attention immediately by calling 911 or calling your MD immediately  if symptoms less severe.  You Must read complete instructions/literature along with all the possible adverse reactions/side effects for all the Medicines you take and that have been prescribed to you. Take any new Medicines after you have completely understood and accpet all the possible adverse reactions/side effects.   Do not drive, operating heavy machinery, perform activities at heights, swimming or participation in water activities or provide baby sitting services if your were admitted for syncope or siezures until you have seen by Primary MD or a Neurologist and advised to do so again.  Do not drive when taking Pain medications.    Do not take more than prescribed Pain, Sleep and Anxiety Medications  Special Instructions: If you have smoked or chewed Tobacco  in the last 2 yrs please stop smoking, stop any regular Alcohol  and or any Recreational drug use.  Wear Seat belts while driving.   Please note  You were cared for by a hospitalist during your hospital stay. If you have any questions about your discharge medications or the care you received while you were in the hospital after you are discharged, you can call the unit and asked to speak with the  hospitalist on call if the hospitalist that took care of you is not available. Once you are discharged, your primary care physician will handle any further medical issues. Please note that NO REFILLS for any discharge medications will be authorized once you are discharged, as it is imperative that you return to your primary care physician (or establish a relationship with a  primary care physician if you do not have one) for your aftercare needs so that they can reassess your need for medications and monitor your lab values.     Increase activity slowly    Complete by:  As directed              Discharge Medications       Medication List    STOP taking these medications        atenolol 25 MG tablet  Commonly known as:  TENORMIN     methocarbamol 500 MG tablet  Commonly known as:  ROBAXIN     traMADol 50 MG tablet  Commonly known as:  ULTRAM      TAKE these medications        ARIPiprazole 10 MG tablet  Commonly known as:  ABILIFY  Take 10 mg by mouth daily.     aspirin 325 MG EC tablet  Take 1 tablet (325 mg total) by mouth daily.     budesonide-formoterol 80-4.5 MCG/ACT inhaler  Commonly known as:  SYMBICORT  Inhale 2 puffs into the lungs 2 (two) times daily.     clonazePAM 0.5 MG tablet  Commonly known as:  KLONOPIN  Take 0.5 tablets (0.25 mg total) by mouth 3 (three) times daily as needed (anxiety).     cyclobenzaprine 5 MG tablet  Commonly known as:  FLEXERIL  Take 2 tablets (10 mg total) by mouth 3 (three) times daily as needed for muscle spasms.     docusate sodium 100 MG capsule  Commonly known as:  COLACE  Take 1 capsule (100 mg total) by mouth 2 (two) times daily as needed for mild constipation.     escitalopram 20 MG tablet  Commonly known as:  LEXAPRO  Take 20 mg by mouth daily.     eszopiclone 2 MG Tabs tablet  Commonly known as:  LUNESTA  Take 3 mg by mouth at bedtime as needed for sleep. Take immediately before bedtime     feeding supplement  (GLUCERNA SHAKE) Liqd  Take 237 mLs by mouth 3 (three) times daily between meals.     fenofibrate 145 MG tablet  Commonly known as:  TRICOR  Take 1 tablet (145 mg total) by mouth daily.     furosemide 40 MG tablet  Commonly known as:  LASIX  Take 40 mg by mouth daily.     HYDROcodone-acetaminophen 10-325 MG tablet  Commonly known as:  NORCO  Take 1 tablet by mouth every 6 (six) hours as needed for moderate pain or severe pain.     HYDROmorphone 2 MG tablet  Commonly known as:  DILAUDID  Take 1 tablet (2 mg total) by mouth every 6 (six) hours as needed for severe pain.     insulin aspart 100 UNIT/ML injection  Commonly known as:  novoLOG  Inject 0-10 Units into the skin 2 (two) times daily. Per sliding scale:  151-200 =  2 201-250 =  3 251-300 =  4 301-350 =  6 351-400 =  8 401+      = 10 units and call provider.     Insulin Glargine 300 UNIT/ML Sopn  Commonly known as:  TOUJEO SOLOSTAR  Inject 35 Units into the skin daily.     Insulin Pen Needle 32G X 5 MM Misc  Commonly known as:  NOVOTWIST  Use 2 per day to inject Toujeo and Victoza     methylphenidate 20 MG tablet  Commonly known as:  RITALIN  Take 20 mg by mouth daily.     multivitamin with minerals tablet  Take 1 tablet by mouth daily.     ODEFSEY 200-25-25 MG Tabs tablet  Generic drug:  emtricitabine-rilpivir-tenofovir AF  TAKE 1 TABLET BY MOUTH DAILY WITH BREAKFAST     pantoprazole 40 MG tablet  Commonly known as:  PROTONIX  Take 40 mg by mouth daily.     potassium chloride SA 20 MEQ tablet  Commonly known as:  K-DUR,KLOR-CON  Take 20 mEq by mouth daily.     pregabalin 225 MG capsule  Commonly known as:  LYRICA  Take 225 mg by mouth 2 (two) times daily.     rosuvastatin 20 MG tablet  Commonly known as:  CRESTOR  Take 20 mg by mouth daily.     Vitamin D3 5000 UNITS Tabs  Take 5,000 Units by mouth daily.     vitamin E 1000 UNIT capsule  Generic drug:  vitamin E  Take 1,000 Units by mouth daily.          Major procedures and Radiology Reports - PLEASE review detailed and final reports for all details, in brief -   TTE - chronic grade 1 diastolic dysfunction, EF [id number].  ABI left foot severe disease with ABI of 0.53, right leg mild arterial disease with ABI of 0.91.  L Leg L SFA stent 11-14-14 by Dr Bridgett Larsson  L foot wound debridement and wound VAC placement on 11/16/2014 by plastic surgery  Left BKA 11-18-14    Mr Foot Left Wo Contrast  11/10/2014  CLINICAL DATA:  Diabetic foot ulcer.  Redness and swelling. EXAM: MRI OF THE LEFT FOREFOOT WITHOUT CONTRAST TECHNIQUE: Multiplanar, multisequence MR imaging was performed. No intravenous contrast was administered. COMPARISON:  Radiographs dated 11/09/2014 FINDINGS: There is extensive subcutaneous gas on the dorsum of the midfoot. No discrete abscesses. There is no bone destruction in the midfoot although there is edema in the cuneiforms, cuboid, and navicular as well as slight edema in the bases of the metatarsals. There is a chronic Freiberg's infraction of the head of the second metatarsal, avascular necrosis. There is small ankle joint effusion. Visualized muscles and tendons are normal. IMPRESSION: Findings consistent with anaerobic cellulitis of the dorsum of the foot. The edema in the bones of the midfoot are most likely stress reactions with secondary to diabetic neuropathy. The findings are not consistent with avascular necrosis in the midfoot or osteomyelitis. Electronically Signed   By: Lorriane Shire M.D.   On: 11/10/2014 07:37   Dg Chest Port 1 View  11/16/2014  CLINICAL DATA:  Hypoglycemia this morning. Cough. Initial encounter. EXAM: PORTABLE CHEST 1 VIEW COMPARISON:  Single view of the chest 07/08/2014 and 11/12/2014. FINDINGS: The lungs are clear. Heart size is normal. No pneumothorax or pleural effusion. Remote right rib fractures are noted. IMPRESSION: No acute disease. Electronically Signed   By: Inge Rise M.D.   On: 11/16/2014  08:00   Dg Chest Port 1 View  11/12/2014  CLINICAL DATA:  Shortness of Breath EXAM: PORTABLE CHEST 1 VIEW COMPARISON:  July 08, 2014 FINDINGS: There is no edema or consolidation. Heart is mildly enlarged with pulmonary vascularity within normal limits. No adenopathy. There are healed rib fractures bilaterally. IMPRESSION: No edema or consolidation.  Stable cardiac prominence. Electronically Signed   By: Lowella Grip III M.D.   On: 11/12/2014 08:11   Dg Foot 2 Views Left  11/09/2014  CLINICAL DATA:  Diabetic foot ulcer on the dorsum of the  foot and ankle. EXAM: LEFT FOOT - 2 VIEW COMPARISON:  None. FINDINGS: There is an open wound on the dorsum of the midfoot with extensive subcutaneous emphysema. No obvious destructive bony changes to suggest osteomyelitis. No destructive joint changes to suggest septic arthritis. There is chronic appearing AVN of the second metatarsal head (Freiberg's infraction). IMPRESSION: 1. Open wound on the dorsum of the midfoot with subcutaneous emphysema. 2. No definite plain film findings to suggest septic arthritis or osteomyelitis 3. Chronic AVN of the second metatarsal head. Electronically Signed   By: Marijo Sanes M.D.   On: 11/09/2014 16:20    Micro Results      Recent Results (from the past 240 hour(s))  MRSA PCR Screening     Status: None   Collection Time: 11/12/14 12:24 PM  Result Value Ref Range Status   MRSA by PCR NEGATIVE NEGATIVE Final    Comment:        The GeneXpert MRSA Assay (FDA approved for NASAL specimens only), is one component of a comprehensive MRSA colonization surveillance program. It is not intended to diagnose MRSA infection nor to guide or monitor treatment for MRSA infections.   C difficile quick scan w PCR reflex     Status: None   Collection Time: 11/14/14  2:00 PM  Result Value Ref Range Status   C Diff antigen NEGATIVE NEGATIVE Final   C Diff toxin NEGATIVE NEGATIVE Final   C Diff interpretation Negative for  toxigenic C. difficile  Final       Today   Subjective    [name] today has no headache,no chest abdominal pain,no new weakness tingling or numbness, feels much better.   Objective   Blood pressure 96/53, pulse 83, temperature 97.7 F (36.5 C), temperature source Oral, resp. rate 15, height 5\' 10"  (1.778 m), weight 112.537 kg (248 lb 1.6 oz), last menstrual period 11/16/2010, SpO2 100 %.   Intake/Output Summary (Last 24 hours) at 11/22/14 0845 Last data filed at 11/22/14 0820  Gross per 24 hour  Intake   2220 ml  Output   4601 ml  Net  -2381 ml    Exam Awake Alert, Oriented x 3, No new F.N deficits, Normal affect [place].AT,PERRAL Supple Neck,No JVD, No cervical lymphadenopathy appriciated.  Symmetrical Chest wall movement, Good air movement bilaterally, CTAB RRR,No Gallops,Rubs or new Murmurs, No Parasternal Heave +ve B.Sounds, Abd Soft, Non tender, No organomegaly appriciated, No rebound -guarding or rigidity. No Cyanosis, Clubbing or edema, No new Rash or bruise, L BKA stump in bandage   Data Review   CBC w Diff: Lab Results  Component Value Date   WBC 7.5 11/20/2014   HGB 9.2* 11/20/2014   HCT 30.6* 11/20/2014   PLT 417* 11/20/2014   LYMPHOPCT 17 11/15/2014   MONOPCT 5 11/15/2014   EOSPCT 3 11/15/2014   BASOPCT 0 11/15/2014    CMP: Lab Results  Component Value Date   NA 136 11/20/2014   K 4.1 11/20/2014   CL 102 11/20/2014   CO2 28 11/20/2014   BUN 15 11/20/2014   CREATININE 1.92* 11/20/2014   CREATININE 1.81* 09/28/2014   PROT 6.5 11/16/2014   ALBUMIN 2.0* 11/16/2014   BILITOT 0.2* 11/16/2014   ALKPHOS 78 11/16/2014   AST 17 11/16/2014   ALT 9* 11/16/2014  .  Lab Results  Component Value Date   HGBA1C 10.7* 11/10/2014    CBG (last 3)   Recent Labs  11/21/14 1709 11/21/14 2103 11/22/14 0739  GLUCAP 157* 176*  212*     Total Time in preparing paper work, data evaluation and todays exam - 35 minutes  Thurnell Lose M.D on  11/22/2014 at 8:45 AM  [facility]   Office  [phone]

## 2014-11-23 ENCOUNTER — Ambulatory Visit: Payer: Medicare HMO | Admitting: Gastroenterology

## 2014-11-23 ENCOUNTER — Ambulatory Visit: Payer: Medicare HMO | Admitting: Endocrinology

## 2014-11-29 ENCOUNTER — Telehealth: Payer: Self-pay | Admitting: Endocrinology

## 2014-11-29 NOTE — Telephone Encounter (Signed)
April Physical Therapist called from [facility], need orders for patient 3 x a week for two weeks and 2 x a week for 4 weeks. Please advise [phone]

## 2014-11-29 NOTE — Telephone Encounter (Signed)
Send to PCP.

## 2014-11-29 NOTE — Telephone Encounter (Signed)
Please see below and advise.

## 2014-11-29 NOTE — Telephone Encounter (Signed)
Noted, message left on her vm

## 2014-11-30 NOTE — Telephone Encounter (Signed)
[place] alone.  OK?

## 2014-12-04 ENCOUNTER — Other Ambulatory Visit: Payer: Self-pay | Admitting: Endocrinology

## 2014-12-09 ENCOUNTER — Encounter: Payer: Self-pay | Admitting: *Deleted

## 2014-12-09 NOTE — Telephone Encounter (Signed)
PA approved, Pharmacy will contact pt to pick up [facility].

## 2014-12-10 ENCOUNTER — Encounter: Payer: Self-pay | Admitting: Vascular Surgery

## 2014-12-10 ENCOUNTER — Telehealth: Payer: Self-pay | Admitting: Endocrinology

## 2014-12-10 NOTE — Telephone Encounter (Signed)
Please advise 

## 2014-12-10 NOTE — Telephone Encounter (Signed)
First available appointment

## 2014-12-10 NOTE — Telephone Encounter (Signed)
Patient called stating that she would like to know when Dr. Dwyane Dee would like to see her back for a follow up?   Please advise    Thank you

## 2014-12-13 NOTE — Telephone Encounter (Signed)
Message was left on patients voice mail to call the office and schedule appointment.

## 2014-12-17 ENCOUNTER — Encounter: Payer: Medicare HMO | Admitting: Vascular Surgery

## 2015-01-05 DIAGNOSIS — E1151 Type 2 diabetes mellitus with diabetic peripheral angiopathy without gangrene: Secondary | ICD-10-CM | POA: Diagnosis not present

## 2015-01-05 DIAGNOSIS — J45909 Unspecified asthma, uncomplicated: Secondary | ICD-10-CM | POA: Diagnosis not present

## 2015-01-05 DIAGNOSIS — N184 Chronic kidney disease, stage 4 (severe): Secondary | ICD-10-CM | POA: Diagnosis not present

## 2015-01-05 DIAGNOSIS — M1990 Unspecified osteoarthritis, unspecified site: Secondary | ICD-10-CM | POA: Diagnosis not present

## 2015-01-05 DIAGNOSIS — I129 Hypertensive chronic kidney disease with stage 1 through stage 4 chronic kidney disease, or unspecified chronic kidney disease: Secondary | ICD-10-CM | POA: Diagnosis not present

## 2015-01-05 DIAGNOSIS — Z4781 Encounter for orthopedic aftercare following surgical amputation: Secondary | ICD-10-CM | POA: Diagnosis not present

## 2015-01-05 DIAGNOSIS — E1122 Type 2 diabetes mellitus with diabetic chronic kidney disease: Secondary | ICD-10-CM | POA: Diagnosis not present

## 2015-01-05 DIAGNOSIS — E1140 Type 2 diabetes mellitus with diabetic neuropathy, unspecified: Secondary | ICD-10-CM | POA: Diagnosis not present

## 2015-01-05 DIAGNOSIS — Z717 Human immunodeficiency virus [HIV] counseling: Secondary | ICD-10-CM | POA: Diagnosis not present

## 2015-01-07 DIAGNOSIS — F411 Generalized anxiety disorder: Secondary | ICD-10-CM | POA: Diagnosis not present

## 2015-01-07 DIAGNOSIS — E1151 Type 2 diabetes mellitus with diabetic peripheral angiopathy without gangrene: Secondary | ICD-10-CM | POA: Diagnosis not present

## 2015-01-07 DIAGNOSIS — I129 Hypertensive chronic kidney disease with stage 1 through stage 4 chronic kidney disease, or unspecified chronic kidney disease: Secondary | ICD-10-CM | POA: Diagnosis not present

## 2015-01-07 DIAGNOSIS — E1140 Type 2 diabetes mellitus with diabetic neuropathy, unspecified: Secondary | ICD-10-CM | POA: Diagnosis not present

## 2015-01-07 DIAGNOSIS — J45909 Unspecified asthma, uncomplicated: Secondary | ICD-10-CM | POA: Diagnosis not present

## 2015-01-07 DIAGNOSIS — Z717 Human immunodeficiency virus [HIV] counseling: Secondary | ICD-10-CM | POA: Diagnosis not present

## 2015-01-07 DIAGNOSIS — N184 Chronic kidney disease, stage 4 (severe): Secondary | ICD-10-CM | POA: Diagnosis not present

## 2015-01-07 DIAGNOSIS — M1990 Unspecified osteoarthritis, unspecified site: Secondary | ICD-10-CM | POA: Diagnosis not present

## 2015-01-07 DIAGNOSIS — E1122 Type 2 diabetes mellitus with diabetic chronic kidney disease: Secondary | ICD-10-CM | POA: Diagnosis not present

## 2015-01-07 DIAGNOSIS — Z4781 Encounter for orthopedic aftercare following surgical amputation: Secondary | ICD-10-CM | POA: Diagnosis not present

## 2015-01-08 ENCOUNTER — Other Ambulatory Visit: Payer: Self-pay | Admitting: Endocrinology

## 2015-01-08 DIAGNOSIS — G894 Chronic pain syndrome: Secondary | ICD-10-CM | POA: Diagnosis not present

## 2015-01-11 ENCOUNTER — Other Ambulatory Visit: Payer: Self-pay | Admitting: Endocrinology

## 2015-01-14 DIAGNOSIS — Z79899 Other long term (current) drug therapy: Secondary | ICD-10-CM | POA: Diagnosis not present

## 2015-01-14 DIAGNOSIS — E1151 Type 2 diabetes mellitus with diabetic peripheral angiopathy without gangrene: Secondary | ICD-10-CM | POA: Diagnosis not present

## 2015-01-14 DIAGNOSIS — E1122 Type 2 diabetes mellitus with diabetic chronic kidney disease: Secondary | ICD-10-CM | POA: Diagnosis not present

## 2015-01-14 DIAGNOSIS — Z5181 Encounter for therapeutic drug level monitoring: Secondary | ICD-10-CM | POA: Diagnosis not present

## 2015-01-14 DIAGNOSIS — F411 Generalized anxiety disorder: Secondary | ICD-10-CM | POA: Diagnosis not present

## 2015-01-14 DIAGNOSIS — N184 Chronic kidney disease, stage 4 (severe): Secondary | ICD-10-CM | POA: Diagnosis not present

## 2015-01-14 DIAGNOSIS — M1990 Unspecified osteoarthritis, unspecified site: Secondary | ICD-10-CM | POA: Diagnosis not present

## 2015-01-14 DIAGNOSIS — J45909 Unspecified asthma, uncomplicated: Secondary | ICD-10-CM | POA: Diagnosis not present

## 2015-01-14 DIAGNOSIS — E1140 Type 2 diabetes mellitus with diabetic neuropathy, unspecified: Secondary | ICD-10-CM | POA: Diagnosis not present

## 2015-01-14 DIAGNOSIS — Z717 Human immunodeficiency virus [HIV] counseling: Secondary | ICD-10-CM | POA: Diagnosis not present

## 2015-01-14 DIAGNOSIS — Z4781 Encounter for orthopedic aftercare following surgical amputation: Secondary | ICD-10-CM | POA: Diagnosis not present

## 2015-01-14 DIAGNOSIS — I129 Hypertensive chronic kidney disease with stage 1 through stage 4 chronic kidney disease, or unspecified chronic kidney disease: Secondary | ICD-10-CM | POA: Diagnosis not present

## 2015-01-19 DIAGNOSIS — E1169 Type 2 diabetes mellitus with other specified complication: Secondary | ICD-10-CM | POA: Diagnosis not present

## 2015-01-19 DIAGNOSIS — R197 Diarrhea, unspecified: Secondary | ICD-10-CM | POA: Diagnosis not present

## 2015-01-19 DIAGNOSIS — I10 Essential (primary) hypertension: Secondary | ICD-10-CM | POA: Diagnosis not present

## 2015-01-19 DIAGNOSIS — E785 Hyperlipidemia, unspecified: Secondary | ICD-10-CM | POA: Diagnosis not present

## 2015-01-19 DIAGNOSIS — R600 Localized edema: Secondary | ICD-10-CM | POA: Diagnosis not present

## 2015-01-19 DIAGNOSIS — Z21 Asymptomatic human immunodeficiency virus [HIV] infection status: Secondary | ICD-10-CM | POA: Diagnosis not present

## 2015-01-19 DIAGNOSIS — E782 Mixed hyperlipidemia: Secondary | ICD-10-CM | POA: Diagnosis not present

## 2015-01-19 DIAGNOSIS — K219 Gastro-esophageal reflux disease without esophagitis: Secondary | ICD-10-CM | POA: Diagnosis not present

## 2015-01-20 ENCOUNTER — Other Ambulatory Visit: Payer: Self-pay | Admitting: *Deleted

## 2015-01-20 ENCOUNTER — Telehealth: Payer: Self-pay | Admitting: *Deleted

## 2015-01-20 DIAGNOSIS — B20 Human immunodeficiency virus [HIV] disease: Secondary | ICD-10-CM

## 2015-01-20 MED ORDER — ELVITEG-COBIC-EMTRICIT-TENOFAF 150-150-200-10 MG PO TABS: 1.0000 | ORAL_TABLET | Freq: Every day | ORAL | Status: DC

## 2015-01-20 NOTE — Telephone Encounter (Signed)
[place] calling with question.  Patient is establishing primary care there, requesting refill of protonix 40 mg.  Pharmacy stated there was an interaction with her [place]. [facility] needs advice on what to do.  Per Sadie Haber, patient has been on protonix for years. Vernell Leep was just started 2 months ago. PLEASE ADVISE. Landis Gandy, RN

## 2015-01-20 NOTE — Telephone Encounter (Signed)
Pt can be change to genvoya 1 tab po qday

## 2015-01-21 ENCOUNTER — Telehealth: Payer: Self-pay | Admitting: *Deleted

## 2015-01-21 NOTE — Telephone Encounter (Signed)
Needing PA for [facility] - sent to CoverMyMeds.  May take 24-72 business hours for response.  Requested "urgent" response.

## 2015-01-21 NOTE — Telephone Encounter (Signed)
Notified patient, pharmacy and pcp. Thanks!

## 2015-01-24 DIAGNOSIS — M79606 Pain in leg, unspecified: Secondary | ICD-10-CM | POA: Diagnosis not present

## 2015-01-24 DIAGNOSIS — M545 Low back pain: Secondary | ICD-10-CM | POA: Diagnosis not present

## 2015-01-24 DIAGNOSIS — G8929 Other chronic pain: Secondary | ICD-10-CM | POA: Diagnosis not present

## 2015-01-24 DIAGNOSIS — Z79891 Long term (current) use of opiate analgesic: Secondary | ICD-10-CM | POA: Diagnosis not present

## 2015-01-26 DIAGNOSIS — R197 Diarrhea, unspecified: Secondary | ICD-10-CM | POA: Diagnosis not present

## 2015-01-28 DIAGNOSIS — F411 Generalized anxiety disorder: Secondary | ICD-10-CM | POA: Diagnosis not present

## 2015-02-07 ENCOUNTER — Other Ambulatory Visit: Payer: Medicare HMO

## 2015-02-07 NOTE — Telephone Encounter (Signed)
PA approved - pt received Genvoya 01/24/15

## 2015-02-10 ENCOUNTER — Ambulatory Visit: Payer: Medicare HMO | Admitting: Endocrinology

## 2015-02-13 ENCOUNTER — Other Ambulatory Visit: Payer: Self-pay | Admitting: Endocrinology

## 2015-02-14 DIAGNOSIS — Z79899 Other long term (current) drug therapy: Secondary | ICD-10-CM | POA: Diagnosis not present

## 2015-02-14 DIAGNOSIS — Z5181 Encounter for therapeutic drug level monitoring: Secondary | ICD-10-CM | POA: Diagnosis not present

## 2015-02-17 ENCOUNTER — Other Ambulatory Visit: Payer: Self-pay | Admitting: Endocrinology

## 2015-02-17 ENCOUNTER — Other Ambulatory Visit: Payer: Medicare HMO

## 2015-02-22 DIAGNOSIS — E118 Type 2 diabetes mellitus with unspecified complications: Secondary | ICD-10-CM | POA: Diagnosis not present

## 2015-02-22 DIAGNOSIS — D638 Anemia in other chronic diseases classified elsewhere: Secondary | ICD-10-CM | POA: Diagnosis not present

## 2015-02-22 DIAGNOSIS — Z89512 Acquired absence of left leg below knee: Secondary | ICD-10-CM | POA: Diagnosis not present

## 2015-02-22 DIAGNOSIS — I10 Essential (primary) hypertension: Secondary | ICD-10-CM | POA: Diagnosis not present

## 2015-02-22 DIAGNOSIS — Z87898 Personal history of other specified conditions: Secondary | ICD-10-CM | POA: Diagnosis not present

## 2015-02-22 DIAGNOSIS — N184 Chronic kidney disease, stage 4 (severe): Secondary | ICD-10-CM | POA: Diagnosis not present

## 2015-02-22 DIAGNOSIS — Z21 Asymptomatic human immunodeficiency virus [HIV] infection status: Secondary | ICD-10-CM | POA: Diagnosis not present

## 2015-02-22 DIAGNOSIS — E669 Obesity, unspecified: Secondary | ICD-10-CM | POA: Diagnosis not present

## 2015-02-22 DIAGNOSIS — N179 Acute kidney failure, unspecified: Secondary | ICD-10-CM | POA: Diagnosis not present

## 2015-02-23 ENCOUNTER — Ambulatory Visit: Payer: Medicare HMO | Admitting: Endocrinology

## 2015-02-23 DIAGNOSIS — F411 Generalized anxiety disorder: Secondary | ICD-10-CM | POA: Diagnosis not present

## 2015-02-24 ENCOUNTER — Other Ambulatory Visit: Payer: Self-pay | Admitting: Endocrinology

## 2015-03-07 ENCOUNTER — Other Ambulatory Visit: Payer: Medicare HMO

## 2015-03-10 ENCOUNTER — Ambulatory Visit: Payer: Medicare HMO | Admitting: Endocrinology

## 2015-03-14 ENCOUNTER — Other Ambulatory Visit (INDEPENDENT_AMBULATORY_CARE_PROVIDER_SITE_OTHER): Payer: Commercial Managed Care - HMO

## 2015-03-14 DIAGNOSIS — E118 Type 2 diabetes mellitus with unspecified complications: Secondary | ICD-10-CM | POA: Diagnosis not present

## 2015-03-14 DIAGNOSIS — E1165 Type 2 diabetes mellitus with hyperglycemia: Secondary | ICD-10-CM

## 2015-03-14 LAB — BASIC METABOLIC PANEL
BUN: 34 mg/dL — AB (ref 6–23)
CALCIUM: 10 mg/dL (ref 8.4–10.5)
CO2: 29 mEq/L (ref 19–32)
CREATININE: 1.92 mg/dL — AB (ref 0.40–1.20)
Chloride: 96 mEq/L (ref 96–112)
GFR: 29.27 mL/min — AB (ref >60.00)
Glucose, Bld: 258 mg/dL — ABNORMAL HIGH (ref 70–99)
Potassium: 4.5 mEq/L (ref 3.5–5.1)
Sodium: 133 mEq/L — ABNORMAL LOW (ref 135–145)

## 2015-03-15 LAB — FRUCTOSAMINE: FRUCTOSAMINE: 439 umol/L — AB (ref 0–285)

## 2015-03-16 DIAGNOSIS — N184 Chronic kidney disease, stage 4 (severe): Secondary | ICD-10-CM | POA: Diagnosis not present

## 2015-03-16 DIAGNOSIS — Z5181 Encounter for therapeutic drug level monitoring: Secondary | ICD-10-CM | POA: Diagnosis not present

## 2015-03-16 DIAGNOSIS — Z79899 Other long term (current) drug therapy: Secondary | ICD-10-CM | POA: Diagnosis not present

## 2015-03-16 LAB — URINALYSIS, ROUTINE W REFLEX MICROSCOPIC
BILIRUBIN URINE: NEGATIVE
HGB URINE DIPSTICK: NEGATIVE
Ketones, ur: NEGATIVE
LEUKOCYTES UA: NEGATIVE
NITRITE: NEGATIVE
Specific Gravity, Urine: <=1.005 — AB (ref 1.000–1.030)
TOTAL PROTEIN, URINE-UPE24: NEGATIVE
Urine Glucose: 250 — AB
Urobilinogen, UA: 0.2 (ref 0.0–1.0)
pH: 6.5 (ref 5.0–8.0)

## 2015-03-16 LAB — MICROALBUMIN / CREATININE URINE RATIO
Creatinine,U: 25.4 mg/dL
MICROALB/CREAT RATIO: 2.8 mg/g (ref 0.0–30.0)
Microalb, Ur: <0.7 mg/dL (ref 0.0–1.9)

## 2015-03-17 ENCOUNTER — Encounter: Payer: Self-pay | Admitting: Endocrinology

## 2015-03-17 ENCOUNTER — Ambulatory Visit (INDEPENDENT_AMBULATORY_CARE_PROVIDER_SITE_OTHER): Payer: Commercial Managed Care - HMO | Admitting: Endocrinology

## 2015-03-17 VITALS — BP 110/70 | HR 83 | Temp 98.6°F | Resp 14

## 2015-03-17 DIAGNOSIS — Z89512 Acquired absence of left leg below knee: Secondary | ICD-10-CM

## 2015-03-17 DIAGNOSIS — Z794 Long term (current) use of insulin: Secondary | ICD-10-CM

## 2015-03-17 DIAGNOSIS — E1165 Type 2 diabetes mellitus with hyperglycemia: Secondary | ICD-10-CM

## 2015-03-17 DIAGNOSIS — E1142 Type 2 diabetes mellitus with diabetic polyneuropathy: Secondary | ICD-10-CM

## 2015-03-17 MED ORDER — INSULIN REGULAR HUMAN (CONC) 500 UNIT/ML ~~LOC~~ SOPN: PEN_INJECTOR | SUBCUTANEOUS | Status: DC

## 2015-03-17 NOTE — Progress Notes (Signed)
Patient ID: [name], female   DOB: October 13, 1965, 50 y.o.   MRN: [id number]           Reason for Appointment: Follow-up  for Type 2 Diabetes   History of Present Illness:          Date of diagnosis of type 2 diabetes mellitus: 2000       Background history:     The patient was apparently diagnosed incidentally with her diabetes 16 years ago 16 years ago, was asymptomatic. She took metformin and possibly other oral hypoglycemic drugs but subsequently went on insulin in 2005 She was taking various insulin regimens in the past  She was being treated by an endocrinologist some time ago and he switched her To Toujeo and also tried her on Byetta.  She thinks that she had better blood sugars with Byetta but she did not follow-up with him  Recent history:   INSULIN regimen is: Novolog 15-20 units usually before meals. Toujeo 70 units bid  She has not been seen in follow-up since her initial consultation on 11/08/14 At that time she was having persistently poor control and she was told to increase her Toujeo to twice a day as well as take Novolog consistently before meals She was also started on Victoza which she is taking 1.2 mg daily Blood sugars appear to be still poorly controlled as judged by her fructosamine of 439  Current management, blood sugar patterns and problems identified:  She again did not bring her monitor for download  She thinks her fasting blood sugars are relatively better but blood sugars go up by lunchtime and stay up to rest of the day  Her glucose in the lab was 256 but this was early afternoon without any food  She has tried to improve her diet and now avoiding drinks which sugar  Does not think she has decreased portions are seen any change in sugars with starting Victoza  Not able to be active because of her left BKA  Non-insulin hypoglycemic drugs the patient is taking are: Victoza     Side effects from medications have been: None  Compliance with  the medical regimen:  fair Hypoglycemia:  rarely low if not eating , none for 2 months  Glucose monitoring:  done  times a day         Glucometer: One Touch Verio.      Blood Glucose readings by recall:    Mean values apply above for all meters except median for One Touch  PRE-MEAL Fasting Lunch Dinner Bedtime Overall  Glucose range: 130-170 270-325 300 300   Mean/median:        Self-care: The diet that the patient has been following is: tries to limit Fat intake .        Typical meal intake: Breakfast is  toast, yogurt, oatmeal.  Lunch is variable, has mixed meal at dinnertime, usually avoiding fried food.  Has variable amounts of snacks.  She is avoiding drinks with sugar                Dietician visit, most recent: At [facility]               Exercise:  none  Weight history:  Wt Readings from Last 3 Encounters:  11/22/14 248 lb 1.6 oz (112.537 kg)  11/09/14 243 lb (110.224 kg)  07/08/14 243 lb (110.224 kg)    Glycemic control: 11 in 8/16   Lab Results  Component Value Date  HGBA1C 10.7* 11/10/2014   HGBA1C 8.4* 07/08/2014   HGBA1C 8.3* 05/03/2014   Lab Results  Component Value Date   MICROALBUR <0.7 03/16/2015   LDLCALC 75 09/28/2014   CREATININE 1.92* 03/14/2015    Lab on 03/14/2015  Component Date Value Ref Range Status  . Sodium 03/14/2015 133* 135 - 145 mEq/L Final  . Potassium 03/14/2015 4.5  3.5 - 5.1 mEq/L Final  . Chloride 03/14/2015 96  96 - 112 mEq/L Final  . CO2 03/14/2015 29  19 - 32 mEq/L Final  . Glucose, Bld 03/14/2015 258* 70 - 99 mg/dL Final  . BUN 03/14/2015 34* 6 - 23 mg/dL Final  . Creatinine, Ser 03/14/2015 1.92* 0.40 - 1.20 mg/dL Final  . Calcium 03/14/2015 10.0  8.4 - 10.5 mg/dL Final  . GFR 03/14/2015 29.27* >60.00 mL/min Final  . Fructosamine 03/14/2015 439* 0 - 285 umol/L Final   Comment: Published reference interval for apparently healthy subjects between age 54 and 12 is 47 - 285 umol/L and in a poorly controlled diabetic population  is 228 - 563 umol/L with a mean of 396 umol/L.   [name] Kitchen Microalb, Ur 03/16/2015 <0.7  0.0 - 1.9 mg/dL Final  . Creatinine,U 03/16/2015 25.4   Final  . Microalb Creat Ratio 03/16/2015 2.8  0.0 - 30.0 mg/g Final  . Color, Urine 03/16/2015 YELLOW  Yellow;Lt. Yellow Final  . APPearance 03/16/2015 CLEAR  Clear Final  . Specific Gravity, Urine 03/16/2015 <=1.005* 1.000 - 1.030 Final  . pH 03/16/2015 6.5  5.0 - 8.0 Final  . Total Protein, Urine 03/16/2015 NEGATIVE  Negative Final  . Urine Glucose 03/16/2015 250* Negative Final  . Ketones, ur 03/16/2015 NEGATIVE  Negative Final  . Bilirubin Urine 03/16/2015 NEGATIVE  Negative Final  . Hgb urine dipstick 03/16/2015 NEGATIVE  Negative Final  . Urobilinogen, UA 03/16/2015 0.2  0.0 - 1.0 Final  . Leukocytes, UA 03/16/2015 NEGATIVE  Negative Final  . Nitrite 03/16/2015 NEGATIVE  Negative Final  . WBC, UA 03/16/2015 0-2/hpf  0-2/hpf Final  . RBC / HPF 03/16/2015 0-2/hpf  0-2/hpf Final  . Squamous Epithelial / LPF 03/16/2015 Rare(0-4/hpf)  Rare(0-4/hpf) Final        Medication List       This list is accurate as of: 03/17/15  9:10 PM.  Always use your most recent med list.               ARIPiprazole 10 MG tablet  Commonly known as:  ABILIFY  Take 10 mg by mouth daily.     aspirin 325 MG EC tablet  Take 1 tablet (325 mg total) by mouth daily.     budesonide-formoterol 80-4.5 MCG/ACT inhaler  Commonly known as:  SYMBICORT  Inhale 2 puffs into the lungs 2 (two) times daily.     clonazePAM 0.5 MG tablet  Commonly known as:  KLONOPIN  Take 0.5 tablets (0.25 mg total) by mouth 3 (three) times daily as needed (anxiety).     cyclobenzaprine 5 MG tablet  Commonly known as:  FLEXERIL  Take 2 tablets (10 mg total) by mouth 3 (three) times daily as needed for muscle spasms.     docusate sodium 100 MG capsule  Commonly known as:  COLACE  Take 1 capsule (100 mg total) by mouth 2 (two) times daily as needed for mild constipation.      elvitegravir-cobicistat-emtricitabine-tenofovir 150-150-200-10 MG Tabs tablet  Commonly known as:  GENVOYA  Take 1 tablet by mouth daily with breakfast.  escitalopram 20 MG tablet  Commonly known as:  LEXAPRO  Take 20 mg by mouth daily.     eszopiclone 2 MG Tabs tablet  Commonly known as:  LUNESTA  Take 3 mg by mouth at bedtime as needed for sleep. Take immediately before bedtime     feeding supplement (GLUCERNA SHAKE) Liqd  Take 237 mLs by mouth 3 (three) times daily between meals.     fenofibrate 145 MG tablet  Commonly known as:  TRICOR  Take 1 tablet (145 mg total) by mouth daily.     furosemide 40 MG tablet  Commonly known as:  LASIX  Take 40 mg by mouth daily.     HYDROcodone-acetaminophen 10-325 MG tablet  Commonly known as:  NORCO  Take 1 tablet by mouth every 6 (six) hours as needed for moderate pain or severe pain.     HYDROmorphone 2 MG tablet  Commonly known as:  DILAUDID  Take 1 tablet (2 mg total) by mouth every 6 (six) hours as needed for severe pain.     Insulin Pen Needle 32G X 5 MM Misc  Commonly known as:  NOVOTWIST  Use 2 per day to inject Toujeo and Victoza     insulin regular human CONCENTRATED 500 UNIT/ML kwikpen  Commonly known as:  HUMULIN R U-500 KWIKPEN  30 minutes before each meal, 60 units before Bfst, 50 at lunch and 35 before supper     methylphenidate 20 MG tablet  Commonly known as:  RITALIN  Take 20 mg by mouth daily.     multivitamin with minerals tablet  Take 1 tablet by mouth daily.     NOVOLOG 100 UNIT/ML injection  Generic drug:  insulin aspart  ADMINISTER 15 TO 20 UNITS UNDER THE SKIN BEFORE MEALS     pantoprazole 40 MG tablet  Commonly known as:  PROTONIX  Take 40 mg by mouth daily.     potassium chloride SA 20 MEQ tablet  Commonly known as:  K-DUR,KLOR-CON  Take 20 mEq by mouth daily.     pregabalin 225 MG capsule  Commonly known as:  LYRICA  Take 225 mg by mouth 2 (two) times daily.     rosuvastatin 20 MG  tablet  Commonly known as:  CRESTOR  Take 20 mg by mouth daily.     TOUJEO SOLOSTAR 300 UNIT/ML Sopn  Generic drug:  Insulin Glargine  INJECT 70 UNITS INTO THE SKIN TWICE DAILY     VICTOZA 18 MG/3ML Sopn  Generic drug:  Liraglutide  INJECT 1.2 MG INTO THE SKIN DAILY     Vitamin D3 5000 units Tabs  Take 5,000 Units by mouth daily.     vitamin E 1000 UNIT capsule  Generic drug:  vitamin E  Take 1,000 Units by mouth daily.        Allergies:  Allergies  Allergen Reactions  . Cortizone-10 [Hydrocortisone] Rash    Per patient, given local injection at knee and developed rash at local site.     Past Medical History  Diagnosis Date  . Anxiety   . Depression   . Hypertension   . HIV infection ([place]) 2000  . Asthma     HOSPITALIZED WITH EXCERBATION OF ASTHMA - AND BRONCHITIS AND THE FLU DEC 2013  . Shortness of breath     ALLERGIES ARE "ACTING UP"  . Diabetes mellitus     ON INSULIN AND ORAL MEDICATIONS  . GERD (gastroesophageal reflux disease)   . Neuropathy     NEUROPATHY  HANDS AND FEET  . Arthritis     LEFT HIP  . Pain     SEVERE PAIN LEFT HIP - HX OF LEFT HIP PINNING JULY 2013    Past Surgical History  Procedure Laterality Date  . Hip pinning,cannulated  07/16/2011    Procedure: CANNULATED HIP PINNING;  Surgeon: Gearlean Alf, MD;  Location: [facility];  Service: Orthopedics;  Laterality: Left;  . Hip pinning,cannulated Left 04/09/2012    Procedure: CANNULATED HIP PINNING AND HARDWARE REVISION;  Surgeon: Gearlean Alf, MD;  Location: [facility];  Service: Orthopedics;  Laterality: Left;  . Hip arthroplasty Left 05/05/2014    Procedure: ARTHROPLASTY OPEN REDUCTION INTERNAL FIXATION LEFT HIP AND REMOVAL OF TWO CANNULATED SCREW;  Surgeon: Latanya Maudlin, MD;  Location: [facility];  Service: Orthopedics;  Laterality: Left;  . Cast application Left [id number]    Procedure: CAST APPLICATION (FIBERGLASS);  Surgeon: Latanya Maudlin, MD;  Location: [facility];  Service: Orthopedics;   Laterality: Left;  CLOSED REDUCTION OF LEFT COLLES FRACTURE WITH SHORT ARM CAST  . Hardware removal Left 05/05/2014    Procedure: HARDWARE REMOVAL LEFT HIP;  Surgeon: Latanya Maudlin, MD;  Location: [facility];  Service: Orthopedics;  Laterality: Left;  REMOVAL BIOMET 6.5-8.0 CANNULATED SCREW  . Peripheral vascular catheterization N/A 11/15/2014    Procedure: Abdominal Aortogram;  Surgeon: Conrad Wilsonville, MD;  Location: [place] [facility];  Service: Cardiovascular;  Laterality: N/A;  . Peripheral vascular catheterization  11/15/2014    Procedure: Lower Extremity Angiography;  Surgeon: Conrad Puyall[place], MD;  Location: [place] [facility];  Service: Cardiovascular;;  . Peripheral vascular catheterization Left 11/15/2014    Procedure: Peripheral Vascular Intervention;  Surgeon: Conrad Dowagiac, MD;  Location: [place] [facility];  Service: Cardiovascular;  Laterality: Left;  popliteal artery stenting  . I&d extremity Left 11/16/2014    Procedure:  DEBRIDEMENT OF LEFT FOOT POSSIBLE APPLICATION OF INTEGRIA AND VAC ;  Surgeon: Irene Limbo, MD;  Location: [place];  Service: Plastics;  Laterality: Left;  . Amputation Left 11/18/2014    Procedure: LEFT BELOW KNEE AMPUTATION;  Surgeon: Leandrew Koyanagi, MD;  Location: [place];  Service: Orthopedics;  Laterality: Left;    Family History  Problem Relation Age of Onset  . Diabetes Mother   . Hypertension Mother   . Vision loss Mother   . Heart disease Mother   . Hypertension Father   . Thyroid disease Neg Hx     Social History:  reports that she has been smoking E-cigarettes.  She has a 17.5 pack-year smoking history. She has never used smokeless tobacco. She reports that she does not drink alcohol or use illicit drugs.    Review of Systems    Lipid history: Has been treated with  fenofibrate, apparently Lipitor has been discontinued and Crestor started    Lab Results  Component Value Date   CHOL 146 09/28/2014   HDL 45* 09/28/2014   LDLCALC 75 09/28/2014    TRIG 130 09/28/2014   CHOLHDL 3.2 09/28/2014          Depression: She is on Lexapro.  Physical Examination:  BP 110/70 mmHg  Pulse 83  Temp(Src) 98.6 F (37 C)  Resp 14  Ht   Wt   SpO2 94%  LMP 11/16/2010  GENERAL:         Patient has generalized obesity.       ASSESSMENT:  Diabetes type 2, uncontrolled    See history of present illness  for detailed discussion of current diabetes management, blood sugar patterns and problems identified  Although she was started on Victoza in addition to her basal bolus insulin regimen her control is still poor. She still is insulin resistant Unable to take metformin because of renal insufficiency  Appears to have significantly high readings during the day and only has relatively better readings fasting by recall Also taking relatively small amounts of mealtime coverage at this time She appears to have a fairly good diet Not able to exercise because of her recent amputation  HYPERLIPIDEMIA: Followed by PCP, currently not on a statin drug  Other medical problems include HIV, depression followed by PCP  PLAN:   She will switch Novolog to Humulin R U-500 insulin which will be much more effective especially with her insulin resistance and large insulin requirement.  Discussed the differences between this and Novolog in detail and given a coupon for free box of pens along with brochure She will start with 60 units before breakfast, 50 units before lunch and 35 before supper; if she is keeping a meal she can take half the amount She will continue Toujeo in the evening but stopped the dose in the morning Also needs to have her blood sugar monitored more consistently after meals and bring monitor for download on each visit  Reviewed her current dietary history and advised her to continue avoiding drinks which sugar as well as having some protein with each meal and avoid excessive snacks especially late at night Consider consultation with diabetes  educator Will need to have A1c checked within the next 2-3 months Increase Victoza to 1.8 mg Recheck lipids on the next visit  Counseling time on subjects discussed above is over 50% of today's 25 minute visit   Patient Instructions  U-500 insulin 30 min before meals  60 units before Bfst, 50 at lunch and 35 before supper  Victoza 1.8mg  daily  Check blood sugars on waking up   times a week Also check blood sugars about 2 hours after a meal and do this after different meals by rotation  Recommended blood sugar levels on waking up is 90-130 and about 2 hours after meal is 130-160  Please bring your blood sugar monitor to each visit, thank you      [facility] 03/17/2015, 9:10 PM   Note: This office note was prepared with Dragon voice recognition system technology. Any transcriptional errors that result from this process are unintentional.

## 2015-03-17 NOTE — Patient Instructions (Signed)
U-500 insulin 30 min before meals  60 units before Bfst, 50 at lunch and 35 before supper  Victoza 1.8mg  daily  Check blood sugars on waking up   times a week Also check blood sugars about 2 hours after a meal and do this after different meals by rotation  Recommended blood sugar levels on waking up is 90-130 and about 2 hours after meal is 130-160  Please bring your blood sugar monitor to each visit, thank you

## 2015-03-21 DIAGNOSIS — G8929 Other chronic pain: Secondary | ICD-10-CM | POA: Diagnosis not present

## 2015-03-21 DIAGNOSIS — Z79891 Long term (current) use of opiate analgesic: Secondary | ICD-10-CM | POA: Diagnosis not present

## 2015-03-21 DIAGNOSIS — M533 Sacrococcygeal disorders, not elsewhere classified: Secondary | ICD-10-CM | POA: Diagnosis not present

## 2015-03-21 DIAGNOSIS — M79606 Pain in leg, unspecified: Secondary | ICD-10-CM | POA: Diagnosis not present

## 2015-03-21 DIAGNOSIS — M4806 Spinal stenosis, lumbar region: Secondary | ICD-10-CM | POA: Diagnosis not present

## 2015-03-21 DIAGNOSIS — Z79899 Other long term (current) drug therapy: Secondary | ICD-10-CM | POA: Diagnosis not present

## 2015-03-25 DIAGNOSIS — S88012A Complete traumatic amputation at knee level, left lower leg, initial encounter: Secondary | ICD-10-CM | POA: Diagnosis not present

## 2015-03-26 ENCOUNTER — Other Ambulatory Visit: Payer: Self-pay | Admitting: Endocrinology

## 2015-04-07 ENCOUNTER — Ambulatory Visit: Payer: Commercial Managed Care - HMO | Admitting: Endocrinology

## 2015-04-12 ENCOUNTER — Ambulatory Visit: Payer: No Typology Code available for payment source | Admitting: Physical Therapy

## 2015-04-21 DIAGNOSIS — M4806 Spinal stenosis, lumbar region: Secondary | ICD-10-CM | POA: Diagnosis not present

## 2015-04-21 DIAGNOSIS — M79606 Pain in leg, unspecified: Secondary | ICD-10-CM | POA: Diagnosis not present

## 2015-04-21 DIAGNOSIS — F329 Major depressive disorder, single episode, unspecified: Secondary | ICD-10-CM | POA: Diagnosis not present

## 2015-04-21 DIAGNOSIS — Z79891 Long term (current) use of opiate analgesic: Secondary | ICD-10-CM | POA: Diagnosis not present

## 2015-04-21 DIAGNOSIS — G8929 Other chronic pain: Secondary | ICD-10-CM | POA: Diagnosis not present

## 2015-05-02 ENCOUNTER — Other Ambulatory Visit: Payer: Self-pay | Admitting: Endocrinology

## 2015-05-02 ENCOUNTER — Encounter: Payer: Self-pay | Admitting: Endocrinology

## 2015-05-02 ENCOUNTER — Ambulatory Visit (INDEPENDENT_AMBULATORY_CARE_PROVIDER_SITE_OTHER): Payer: Commercial Managed Care - HMO | Admitting: Endocrinology

## 2015-05-02 VITALS — BP 132/82 | HR 70 | Temp 98.9°F | Resp 16

## 2015-05-02 DIAGNOSIS — E1165 Type 2 diabetes mellitus with hyperglycemia: Secondary | ICD-10-CM

## 2015-05-02 DIAGNOSIS — Z794 Long term (current) use of insulin: Secondary | ICD-10-CM | POA: Diagnosis not present

## 2015-05-02 NOTE — Patient Instructions (Signed)
Stop Toujeo pm dose  Take same U-500 3x daily and 15 min before meals  Check blood sugars on waking up 3-4  times a week Also check blood sugars about 2 hours after a meal and do this after different meals by rotation  Recommended blood sugar levels on waking up is 90-130 and about 2 hours after meal is 130-160  Please bring your blood sugar monitor to each visit, thank you  Restart Victoza, start 1.2 for 3 days 1.8mg 

## 2015-05-02 NOTE — Progress Notes (Signed)
Patient ID: [name], female   DOB: 12-03-1965, 50 y.o.   MRN: [id number]           Reason for Appointment: Follow-up  for Type 2 Diabetes   History of Present Illness:          Date of diagnosis of type 2 diabetes mellitus: 2000       Background history:     The patient was apparently diagnosed incidentally with her diabetes 16 years ago 16 years ago, was asymptomatic. She took metformin and possibly other oral hypoglycemic drugs but subsequently went on insulin in 2005 She was taking various insulin regimens in the past  She was being treated by an endocrinologist some time ago and he switched her To Toujeo and also tried her on Byetta.  She thinks that she had better blood sugars with Byetta but she did not follow-up   Recent history:   INSULIN regimen is:  Toujeo 70 units twice a day Humulin R U-500 60 units before breakfast, 50 units before lunch and 35 before supper   Since she was having persistently poor control with taking large dose of Toujeo twice a day along with NovoLog her Novolog was switched to U-500 insulin in 3/17 and she is here for follow-up now She was also initially started on Victoza which she is taking 1.2 mg daily, she misunderstood and stopped this completely in 3/17 Blood sugars appear to be significantly better  Current management, blood sugar patterns and problems identified:  She is checking her blood sugar less than once a day and mostly in the evenings and bedtime  She has only a couple of readings fasting which are near normal  She thinks she has occasional episodes of low sugars during the night but none documented  Her lowest blood sugar was 55 about 3 weeks ago midday  She is taking her U-500 consistently 3 times a day including a half dose only when she is not eating a meal   No recent labs available  Postprandial readings are sporadically high especially late at night.  She thinks she is having food cravings late at  night  Non-insulin hypoglycemic drugs the patient is taking are: Victoza     Side effects from medications have been: None  Compliance with the medical regimen:  fair Hypoglycemia:  rarely low if not eating , none for 2 months  Glucose monitoring:  done  times a day         Glucometer: One Touch Verio.      Blood Glucose readings by download  Mean values apply above for all meters except median for One Touch  PRE-MEAL Fasting Lunch Dinner Bedtime Overall  Glucose range: 75  75, 161  86-253    Mean/median:     146    Self-care: The diet that the patient has been following is: tries to limit Fat intake .        Typical meal intake: Breakfast is  toast, yogurt, oatmeal.  Lunch is variable, has mixed meal at dinnertime, usually avoiding fried food.  Has variable amounts of snacks.  She is avoiding drinks with sugar                Dietician visit, most recent: At [facility]               Exercise:  none  Weight history:  Wt Readings from Last 3 Encounters:  11/22/14 248 lb 1.6 oz (112.537 kg)  11/09/14 243 lb (  110.224 kg)  07/08/14 243 lb (110.224 kg)    Glycemic control: 11 in 8/16   Lab Results  Component Value Date   HGBA1C 10.7* 11/10/2014   HGBA1C 8.4* 07/08/2014   HGBA1C 8.3* 05/03/2014   Lab Results  Component Value Date   MICROALBUR <0.7 03/16/2015   [place] 75 09/28/2014   CREATININE 1.92* 03/14/2015    No visits with results within 1 Week(s) from this visit. Latest known visit with results is:  Lab on 03/14/2015  Component Date Value Ref Range Status  . Sodium 03/14/2015 133* 135 - 145 mEq/L Final  . Potassium 03/14/2015 4.5  3.5 - 5.1 mEq/L Final  . Chloride 03/14/2015 96  96 - 112 mEq/L Final  . CO2 03/14/2015 29  19 - 32 mEq/L Final  . Glucose, Bld 03/14/2015 258* 70 - 99 mg/dL Final  . BUN 03/14/2015 34* 6 - 23 mg/dL Final  . Creatinine, Ser 03/14/2015 1.92* 0.40 - 1.20 mg/dL Final  . Calcium 03/14/2015 10.0  8.4 - 10.5 mg/dL Final  . GFR 03/14/2015  29.27* >60.00 mL/min Final  . Fructosamine 03/14/2015 439* 0 - 285 umol/L Final   Comment: Published reference interval for apparently healthy subjects between age 74 and 53 is 4 - 285 umol/L and in a poorly controlled diabetic population is 228 - 563 umol/L with a mean of 396 umol/L.   [name] Kitchen Microalb, Ur 03/16/2015 <0.7  0.0 - 1.9 mg/dL Final  . Creatinine,U 03/16/2015 25.4   Final  . Microalb Creat Ratio 03/16/2015 2.8  0.0 - 30.0 mg/g Final  . Color, Urine 03/16/2015 YELLOW  Yellow;Lt. Yellow Final  . APPearance 03/16/2015 CLEAR  Clear Final  . Specific Gravity, Urine 03/16/2015 <=1.005* 1.000 - 1.030 Final  . pH 03/16/2015 6.5  5.0 - 8.0 Final  . Total Protein, Urine 03/16/2015 NEGATIVE  Negative Final  . Urine Glucose 03/16/2015 250* Negative Final  . Ketones, ur 03/16/2015 NEGATIVE  Negative Final  . Bilirubin Urine 03/16/2015 NEGATIVE  Negative Final  . Hgb urine dipstick 03/16/2015 NEGATIVE  Negative Final  . Urobilinogen, UA 03/16/2015 0.2  0.0 - 1.0 Final  . Leukocytes, UA 03/16/2015 NEGATIVE  Negative Final  . Nitrite 03/16/2015 NEGATIVE  Negative Final  . WBC, UA 03/16/2015 0-2/hpf  0-2/hpf Final  . RBC / HPF 03/16/2015 0-2/hpf  0-2/hpf Final  . Squamous Epithelial / LPF 03/16/2015 Rare(0-4/hpf)  Rare(0-4/hpf) Final        Medication List       This list is accurate as of: 05/02/15 11:59 PM.  Always use your most recent med list.               ARIPiprazole 10 MG tablet  Commonly known as:  ABILIFY  Take 10 mg by mouth daily.     aspirin 325 MG EC tablet  Take 1 tablet (325 mg total) by mouth daily.     budesonide-formoterol 80-4.5 MCG/ACT inhaler  Commonly known as:  SYMBICORT  Inhale 2 puffs into the lungs 2 (two) times daily.     clonazePAM 0.5 MG tablet  Commonly known as:  KLONOPIN  Take 0.5 tablets (0.25 mg total) by mouth 3 (three) times daily as needed (anxiety).     cyclobenzaprine 5 MG tablet  Commonly known as:  FLEXERIL  Take 2 tablets (10 mg  total) by mouth 3 (three) times daily as needed for muscle spasms.     elvitegravir-cobicistat-emtricitabine-tenofovir 150-150-200-10 MG Tabs tablet  Commonly known as:  GENVOYA  Take 1  tablet by mouth daily with breakfast.     escitalopram 20 MG tablet  Commonly known as:  LEXAPRO  Take 20 mg by mouth daily.     eszopiclone 2 MG Tabs tablet  Commonly known as:  LUNESTA  Take 3 mg by mouth at bedtime as needed for sleep. Take immediately before bedtime     feeding supplement (GLUCERNA SHAKE) Liqd  Take 237 mLs by mouth 3 (three) times daily between meals.     fenofibrate 145 MG tablet  Commonly known as:  TRICOR  Take 1 tablet (145 mg total) by mouth daily.     furosemide 40 MG tablet  Commonly known as:  LASIX  Take 40 mg by mouth daily.     HUMULIN R U-500 KWIKPEN 500 UNIT/ML kwikpen  Generic drug:  insulin regular human CONCENTRATED  ADMINISTER DOSES 30 MINUTES BEFORE EACH MEAL. USE 60 UNITS BEFORE BREAKFAST, 50 UNITS BEFORE LUNCH AND 35 UNITS BEFORE DINNER     HYDROcodone-acetaminophen 10-325 MG tablet  Commonly known as:  NORCO  Take 1 tablet by mouth every 6 (six) hours as needed for moderate pain or severe pain.     HYDROmorphone 2 MG tablet  Commonly known as:  DILAUDID  Take 1 tablet (2 mg total) by mouth every 6 (six) hours as needed for severe pain.     Insulin Pen Needle 32G X 5 MM Misc  Commonly known as:  NOVOTWIST  Use 2 per day to inject Toujeo and Victoza     methylphenidate 20 MG tablet  Commonly known as:  RITALIN  Take 20 mg by mouth daily.     multivitamin with minerals tablet  Take 1 tablet by mouth daily.     NOVOLOG 100 UNIT/ML injection  Generic drug:  insulin aspart  ADMINISTER 15 TO 20 UNITS UNDER THE SKIN BEFORE MEALS     pantoprazole 40 MG tablet  Commonly known as:  PROTONIX  Take 40 mg by mouth daily.     potassium chloride SA 20 MEQ tablet  Commonly known as:  K-DUR,KLOR-CON  Take 20 mEq by mouth daily.     pregabalin 225 MG  capsule  Commonly known as:  LYRICA  Take 225 mg by mouth 2 (two) times daily.     rosuvastatin 20 MG tablet  Commonly known as:  CRESTOR  Take 20 mg by mouth daily. Reported on 05/02/2015     TOUJEO SOLOSTAR 300 UNIT/ML Sopn  Generic drug:  Insulin Glargine  INJECT 70 UNITS INTO THE SKIN TWICE DAILY     Vitamin D3 5000 units Tabs  Take 5,000 Units by mouth daily.     vitamin E 1000 UNIT capsule  Generic drug:  vitamin E  Take 1,000 Units by mouth daily.        Allergies:  Allergies  Allergen Reactions  . Cortizone-10 [Hydrocortisone] Rash    Per patient, given local injection at knee and developed rash at local site.     Past Medical History  Diagnosis Date  . Anxiety   . Depression   . Hypertension   . HIV infection ([place]) 2000  . Asthma     HOSPITALIZED WITH EXCERBATION OF ASTHMA - AND BRONCHITIS AND THE FLU DEC 2013  . Shortness of breath     ALLERGIES ARE "ACTING UP"  . Diabetes mellitus     ON INSULIN AND ORAL MEDICATIONS  . GERD (gastroesophageal reflux disease)   . Neuropathy     NEUROPATHY HANDS AND FEET  .  Arthritis     LEFT HIP  . Pain     SEVERE PAIN LEFT HIP - HX OF LEFT HIP PINNING JULY 2013    Past Surgical History  Procedure Laterality Date  . Hip pinning,cannulated  07/16/2011    Procedure: CANNULATED HIP PINNING;  Surgeon: Gearlean Alf, MD;  Location: [facility];  Service: Orthopedics;  Laterality: Left;  . Hip pinning,cannulated Left 04/09/2012    Procedure: CANNULATED HIP PINNING AND HARDWARE REVISION;  Surgeon: Gearlean Alf, MD;  Location: [facility];  Service: Orthopedics;  Laterality: Left;  . Hip arthroplasty Left 05/05/2014    Procedure: ARTHROPLASTY OPEN REDUCTION INTERNAL FIXATION LEFT HIP AND REMOVAL OF TWO CANNULATED SCREW;  Surgeon: Latanya Maudlin, MD;  Location: [facility];  Service: Orthopedics;  Laterality: Left;  . Cast application Left [id number]    Procedure: CAST APPLICATION (FIBERGLASS);  Surgeon: Latanya Maudlin, MD;  Location: [facility];   Service: Orthopedics;  Laterality: Left;  CLOSED REDUCTION OF LEFT COLLES FRACTURE WITH SHORT ARM CAST  . Hardware removal Left 05/05/2014    Procedure: HARDWARE REMOVAL LEFT HIP;  Surgeon: Latanya Maudlin, MD;  Location: [facility];  Service: Orthopedics;  Laterality: Left;  REMOVAL BIOMET 6.5-8.0 CANNULATED SCREW  . Peripheral vascular catheterization N/A 11/15/2014    Procedure: Abdominal Aortogram;  Surgeon: Conrad Mattituck, MD;  Location: [facility];  Service: Cardiovascular;  Laterality: N/A;  . Peripheral vascular catheterization  11/15/2014    Procedure: Lower Extremity Angiography;  Surgeon: Conrad [place], MD;  Location: [facility];  Service: Cardiovascular;;  . Peripheral vascular catheterization Left 11/15/2014    Procedure: Peripheral Vascular Intervention;  Surgeon: Conrad Vernon, MD;  Location: [facility];  Service: Cardiovascular;  Laterality: Left;  popliteal artery stenting  . I&d extremity Left 11/16/2014    Procedure:  DEBRIDEMENT OF LEFT FOOT POSSIBLE APPLICATION OF INTEGRIA AND VAC ;  Surgeon: Irene Limbo, MD;  Location: [facility];  Service: Plastics;  Laterality: Left;  . Amputation Left 11/18/2014    Procedure: LEFT BELOW KNEE AMPUTATION;  Surgeon: Leandrew Koyanagi, MD;  Location: [facility];  Service: Orthopedics;  Laterality: Left;    Family History  Problem Relation Age of Onset  . Diabetes Mother   . Hypertension Mother   . Vision loss Mother   . Heart disease Mother   . Hypertension Father   . Thyroid disease Neg Hx     Social History:  reports that she has been smoking E-cigarettes.  She has a 17.5 pack-year smoking history. She has never used smokeless tobacco. She reports that she does not drink alcohol or use illicit drugs.    Review of Systems    Lipid history: Has been treated with  Fenofibrate and Crestor by PCP     Lab Results  Component Value Date   CHOL 146 09/28/2014   HDL 45* 09/28/2014   LDLCALC 75 09/28/2014   TRIG 130 09/28/2014    CHOLHDL 3.2 09/28/2014          Depression: She is on Lexapro.  Physical Examination:  BP 132/82 mmHg  Pulse 70  Temp(Src) 98.9 F (37.2 C)  Resp 16  Ht   Wt   SpO2 95%  LMP 11/16/2010      ASSESSMENT:  Diabetes type 2, uncontrolled    See history of present illness for detailed discussion of current diabetes management, blood sugar patterns and problems identified  She has much better blood sugar control with adding  U-500 insulin to her basal insulin She has however misunderstood all the instructions except for the new insulin; she did not stop her Toujeo in the evening as directed and stopped her Victoza which was supposed to be increased to 1.8 mg She has sporadic high postprandial readings now with eating more snacks late in the evenings Difficult to assess her weight change She is also not checking blood sugars enough Not able to exercise because of her  amputation  HYPERLIPIDEMIA: Followed by PCP  Other medical problems include HIV, depression followed by PCP  PLAN:   She will start back on Victoza with 1.2 and then increase to 1.8 Increase U-500 x 5-10 units for larger meals Continue taking this 3 times a day including half dose for Miss meals Stop Toujeo in the evening for now Check blood sugar more consistently at various times including some waking up Reduce snacks and portions at meals Check A1c on the next visit  Counseling time on subjects discussed above is over 50% of today's 25 minute visit   Patient Instructions  Stop Toujeo pm dose  Take same U-500 3x daily and 15 min before meals  Check blood sugars on waking up 3-4  times a week Also check blood sugars about 2 hours after a meal and do this after different meals by rotation  Recommended blood sugar levels on waking up is 90-130 and about 2 hours after meal is 130-160  Please bring your blood sugar monitor to each visit, thank you  Restart Victoza, start 1.2 for 3 days  1.8mg       Natalee Tomkiewicz 05/03/2015, 8:26 AM   Note: This office note was prepared with Estate agent. Any transcriptional errors that result from this process are unintentional.

## 2015-05-03 DIAGNOSIS — Z7984 Long term (current) use of oral hypoglycemic drugs: Secondary | ICD-10-CM | POA: Diagnosis not present

## 2015-05-03 DIAGNOSIS — H52223 Regular astigmatism, bilateral: Secondary | ICD-10-CM | POA: Diagnosis not present

## 2015-05-03 DIAGNOSIS — Z794 Long term (current) use of insulin: Secondary | ICD-10-CM | POA: Diagnosis not present

## 2015-05-03 DIAGNOSIS — E119 Type 2 diabetes mellitus without complications: Secondary | ICD-10-CM | POA: Diagnosis not present

## 2015-05-03 DIAGNOSIS — I10 Essential (primary) hypertension: Secondary | ICD-10-CM | POA: Diagnosis not present

## 2015-05-03 DIAGNOSIS — H5210 Myopia, unspecified eye: Secondary | ICD-10-CM | POA: Diagnosis not present

## 2015-05-03 DIAGNOSIS — H524 Presbyopia: Secondary | ICD-10-CM | POA: Diagnosis not present

## 2015-05-03 DIAGNOSIS — H35033 Hypertensive retinopathy, bilateral: Secondary | ICD-10-CM | POA: Diagnosis not present

## 2015-05-03 DIAGNOSIS — H11159 Pinguecula, unspecified eye: Secondary | ICD-10-CM | POA: Diagnosis not present

## 2015-05-09 DIAGNOSIS — Z79899 Other long term (current) drug therapy: Secondary | ICD-10-CM | POA: Diagnosis not present

## 2015-05-09 DIAGNOSIS — Z5181 Encounter for therapeutic drug level monitoring: Secondary | ICD-10-CM | POA: Diagnosis not present

## 2015-05-11 ENCOUNTER — Ambulatory Visit: Payer: Commercial Managed Care - HMO | Attending: Orthopaedic Surgery | Admitting: Physical Therapy

## 2015-05-11 ENCOUNTER — Encounter: Payer: Self-pay | Admitting: Physical Therapy

## 2015-05-11 DIAGNOSIS — R2689 Other abnormalities of gait and mobility: Secondary | ICD-10-CM

## 2015-05-11 DIAGNOSIS — M6281 Muscle weakness (generalized): Secondary | ICD-10-CM

## 2015-05-11 DIAGNOSIS — R2681 Unsteadiness on feet: Secondary | ICD-10-CM

## 2015-05-11 DIAGNOSIS — M79662 Pain in left lower leg: Secondary | ICD-10-CM

## 2015-05-11 DIAGNOSIS — R29898 Other symptoms and signs involving the musculoskeletal system: Secondary | ICD-10-CM | POA: Diagnosis not present

## 2015-05-11 NOTE — Therapy (Signed)
[facility] [address] [place], [place], [address] Phone: [phone]   Fax:  [phone]  Physical Therapy Evaluation  Patient Details  Name: [name] MRN: [id number] Date of Birth: 1965-08-17 Referring Provider: Frankey Shown, MD  Encounter Date: 05/11/2015      PT End of Session - 05/11/15 2203    Visit Number 1   Number of Visits 17   Date for PT Re-Evaluation 07/08/15   Authorization Type Medicare G-Code & progress report every 10 visits   PT Start Time 0755   PT Stop Time 0840   PT Time Calculation (min) 45 min   Equipment Utilized During Treatment Gait belt   Activity Tolerance Patient tolerated treatment well   Behavior During Therapy [facility] for tasks assessed/performed      Past Medical History  Diagnosis Date  . Anxiety   . Depression   . Hypertension   . HIV infection ([place]) 2000  . Asthma     HOSPITALIZED WITH EXCERBATION OF ASTHMA - AND BRONCHITIS AND THE FLU DEC 2013  . Shortness of breath     ALLERGIES ARE "ACTING UP"  . Diabetes mellitus     ON INSULIN AND ORAL MEDICATIONS  . GERD (gastroesophageal reflux disease)   . Neuropathy     NEUROPATHY HANDS AND FEET  . Arthritis     LEFT HIP  . Pain     SEVERE PAIN LEFT HIP - HX OF LEFT HIP PINNING JULY 2013    Past Surgical History  Procedure Laterality Date  . Hip pinning,cannulated  07/16/2011    Procedure: CANNULATED HIP PINNING;  Surgeon: Gearlean Alf, MD;  Location: [facility];  Service: Orthopedics;  Laterality: Left;  . Hip pinning,cannulated Left 04/09/2012    Procedure: CANNULATED HIP PINNING AND HARDWARE REVISION;  Surgeon: Gearlean Alf, MD;  Location: [facility];  Service: Orthopedics;  Laterality: Left;  . Hip arthroplasty Left 05/05/2014    Procedure: ARTHROPLASTY OPEN REDUCTION INTERNAL FIXATION LEFT HIP AND REMOVAL OF TWO CANNULATED SCREW;  Surgeon: Latanya Maudlin, MD;  Location: [facility];  Service: Orthopedics;  Laterality: Left;   . Cast application Left [id number]    Procedure: CAST APPLICATION (FIBERGLASS);  Surgeon: Latanya Maudlin, MD;  Location: [facility];  Service: Orthopedics;  Laterality: Left;  CLOSED REDUCTION OF LEFT COLLES FRACTURE WITH SHORT ARM CAST  . Hardware removal Left 05/05/2014    Procedure: HARDWARE REMOVAL LEFT HIP;  Surgeon: Latanya Maudlin, MD;  Location: [facility];  Service: Orthopedics;  Laterality: Left;  REMOVAL BIOMET 6.5-8.0 CANNULATED SCREW  . Peripheral vascular catheterization N/A 11/15/2014    Procedure: Abdominal Aortogram;  Surgeon: Conrad [place], MD;  Location: [facility];  Service: Cardiovascular;  Laterality: N/A;  . Peripheral vascular catheterization  11/15/2014    Procedure: Lower Extremity Angiography;  Surgeon: Conrad [place], MD;  Location: [facility];  Service: Cardiovascular;;  . Peripheral vascular catheterization Left 11/15/2014    Procedure: Peripheral Vascular Intervention;  Surgeon: Conrad [place], MD;  Location: [facility];  Service: Cardiovascular;  Laterality: Left;  popliteal artery stenting  . I&d extremity Left 11/16/2014    Procedure:  DEBRIDEMENT OF LEFT FOOT POSSIBLE APPLICATION OF INTEGRIA AND VAC ;  Surgeon: Irene Limbo, MD;  Location: [facility];  Service: Plastics;  Laterality: Left;  . Amputation Left 11/18/2014    Procedure: LEFT BELOW KNEE AMPUTATION;  Surgeon: Leandrew Koyanagi, MD;  Location: [facility];  Service: Orthopedics;  Laterality: Left;    There were no vitals filed for this visit.       Subjective Assessment - 05/11/15 0801    Subjective This 50yo female underwent a left Transtibial Amputation 11/18/2014 with infected wound most of 2016. She is deconditioned from prolonged limited mobility. She recieved her first prosthesis on 03/25/2015 and is dependent in use & care. She presents for PT evaluation and prosthetic training.     Patient is accompained by: Family member   Pertinent History DM2, neuropathy, HIV, mood disorder, HTN, CKD (stage  IV), asthma, arthritis, left hip fracture with ORIF 07/16/2011 and hardware removal with new pinning 05/05/2014   Limitations Lifting;Standing;Walking   Patient Stated Goals to use prosthesis to go in community including shopping, church, [place] meetings   Currently in Pain? Yes   Pain Score 4   Since 5 days ago, worst 10/10, best 4/10   Pain Location Leg  residual limb   Pain Orientation Left   Pain Descriptors / Indicators Sharp   Pain Type Acute pain   Pain Onset In the past 7 days   Pain Frequency Constant   Aggravating Factors  fell onto limb   Pain Relieving Factors limits standing   Multiple Pain Sites No            [facility] PT Assessment - 05/11/15 0800    Assessment   Medical Diagnosis Left Transtibial Amputation   Referring Provider Frankey Shown, MD   Onset Date/Surgical Date 03/25/15  prosthesis delivery   Hand Dominance Right   Precautions   Precautions Fall   Restrictions   Weight Bearing Restrictions No   Balance Screen   Has the patient fallen in the past 6 months Yes   How many times? 2  tub seat collapsed and standing up without prosthesis   Has the patient had a decrease in activity level because of a fear of falling?  Yes   Is the patient reluctant to leave their home because of a fear of falling?  No   Home Environment   Living Environment Private residence   Living Arrangements Parent   Type of [place] entrance  2nd entrance has 7 steps with right [place] One level   [place] - 2 wheels;Cane - single point;Crutches;Tub bench;Grab bars - tub/shower;Wheelchair - manual   Prior Function   Level of Independence Independent;Independent with gait;Independent with community mobility with device  used cane for left hip occassionally   Vocation On disability   Leisure shopping,    Observation/Other Assessments   Focus on Therapeutic Outcomes (FOTO)  54 Functional Status   Fear Avoidance Belief Questionnaire (FABQ)   61 (16)   Transfers   Transfers Sit to Stand;Stand to Sit   Sit to Stand 5: Supervision;With upper extremity assist;With armrests;From chair/3-in-1  to RW to stabilize   Stand to Sit 5: Supervision;With upper extremity assist;With armrests;To chair/3-in-1  from RW for stability   Ambulation/Gait   Ambulation/Gait Yes   Ambulation/Gait Assistance 4: Min assist   Ambulation Distance (Feet) 20 Feet   Assistive device Rolling walker;Prosthesis   Gait Pattern Step-to pattern;Decreased step length - right;Decreased stance time - left;Decreased stride length;Decreased hip/knee flexion - left;Decreased weight shift to left;Left circumduction;Left hip hike;Left flexed knee in stance;Antalgic;Lateral hip instability;Trunk flexed;Abducted - left;Poor foot clearance - left   Ambulation Surface Indoor;Level   Standardized Balance Assessment   Standardized Balance Assessment Berg Balance Test;Timed Up and Go Test  Berg Balance Test   Sit to Stand Needs minimal aid to stand or to stabilize  requires RW to stabilize or back of legs against sturdy chai   Standing Unsupported Unable to stand 30 seconds unassisted  Stands with RW support with supervision   Sitting with Back Unsupported but Feet Supported on Floor or Stool Able to sit safely and securely 2 minutes   Stand to Sit Uses backs of legs against chair to control descent  requires RW for stabilization,    Transfers Able to transfer safely, definite need of hands   Standing Unsupported with Eyes Closed Needs help to keep from falling  with RW 3 seconds with supervision   Standing Ubsupported with Feet Together Needs help to attain position and unable to hold for 15 seconds   From Standing, Reach Forward with Outstretched Arm Loses balance while trying/requires external support  with RW reaches 3" with supervision   From Standing Position, Pick up Object from Floor Unable to try/needs assist to keep balance  with RW picks up object with  supervision   From Standing Position, Turn to Look Behind Over each Shoulder Needs assist to keep from losing balance and falling  with RW looks to side only with supervision   Turn 360 Degrees Needs assistance while turning   Standing Unsupported, Alternately Place Feet on Step/Stool Needs assistance to keep from falling or unable to try   Standing Unsupported, One Foot in [employer] balance while stepping or standing   Standing on One Leg Unable to try or needs assist to prevent fall  with RW stands on RLE 10" & on LLE 3" with SBA   Total Score 10   Timed Up and Go Test   Normal TUG (seconds) 35.93  with RW         Prosthetics Assessment - 05/11/15 0800    Prosthetics   Prosthetic Care Dependent with Skin check;Residual limb care;Care of non-amputated limb;Prosthetic cleaning;Ply sock cleaning;Correct ply sock adjustment;Proper wear schedule/adjustment;Proper weight-bearing schedule/adjustment   Donning prosthesis  Supervision   Doffing prosthesis  Supervision   Current prosthetic wear tolerance (days/week)  reports daily since delivery   Current prosthetic wear tolerance (#hours/day)  reports started with 1hr progressed to most of day. Her residual limb has pre-blister rash with redness on anterior limb medial & lateral to tibia with tenderness at tibia; redness over patella.    Current prosthetic weight-bearing tolerance (hours/day)  patient tolerated standing with partial weight on prosthesis with no complaint of limb discomfort   Edema pitting edema   Residual limb condition  pre-blister redness over medial & lateral compartments. redness over patella.                   [facility] Adult PT Treatment/Exercise - 05/11/15 0800    Prosthetics   Education Provided Skin check;Residual limb care;Prosthetic cleaning;Ply sock cleaning;Correct ply sock adjustment;Proper Donning;Proper Doffing;Proper wear schedule/adjustment;Proper weight-bearing schedule/adjustment  decrease wear to  6 hrs 2xday drying half way   Person(s) Educated Patient;Caregiver(s)   Education Method Explanation;Demonstration;Tactile cues;Verbal cues   Education Method Returned demonstration;Verbalized understanding;Tactile cues required;Verbal cues required;Needs further instruction                  PT Short Term Goals - 05/11/15 2222    PT SHORT TERM GOAL #1   Title Patient tolerates wear of prosthesis >12hrs/day without skin issues. (Target Date: 06/10/2015)   Time 4   Period Weeks   Status New   PT  SHORT TERM GOAL #2   Title Patient properly donnes prosthesis and verbalizes proper adjustment of wear schedule. (Target Date: 06/10/2015)   Time 4   Period Weeks   Status New   PT SHORT TERM GOAL #3   Title Patient ambulates 100' with RW & prosthesis with supervision. (Target Date: 06/10/2015)   Time 4   Period Weeks   Status New   PT SHORT TERM GOAL #4   Title Patient negotiates ramps & curbs with RW & prosthesis with minA. (Target Date: 06/10/2015)   Time 4   Period Weeks   Status New   PT SHORT TERM GOAL #5   Title Patient performs standing balance activities with RW support reaching 10" all directions, to floor and manages clothes safely. (Target Date: 06/10/2015)   Time 4   Period Weeks   Status New           PT Long Term Goals - 05/11/15 2216    PT LONG TERM GOAL #1   Title Patient tolerates wear of prosthesis >90% of awake hours without skin issues or limb tenderness. (Target Date: 07/08/2015)   Time 8   Period Weeks   Status New   PT LONG TERM GOAL #2   Title Patient verbalizes & demonstrates understanding of prosthetic care to enable safe use of prosthesis.  (Target Date: 07/08/2015)   Time 8   Period Weeks   Status New   PT LONG TERM GOAL #3   Title Berg Balance > 24/56 to indicate lower fall risk.  (Target Date: 07/08/2015)   Time 8   Period Weeks   Status New   PT LONG TERM GOAL #4   Title Timed Up & Go <20 seconds modified independent safely.  (Target Date:  07/08/2015)   Time 8   Period Weeks   Status New   PT LONG TERM GOAL #5   Title Patient ambulates 250' with LRAD & prosthesis modified independent to enable safe community mobility.  (Target Date: 07/08/2015)   Time 8   Period Weeks   Status New   Additional Long Term Goals   Additional Long Term Goals Yes   PT LONG TERM GOAL #6   Title Patient negotiates ramps & curbs with LRAD & prosthesis modified indpendent to enable safe community mobility.  (Target Date: 07/08/2015)   Time 8   Period Weeks   Status New               Plan - 05/11/15 2204    Clinical Impression Statement Patient's condition is evolving and plan of care has moderate complexity. This 50yo female recieved her first prosthesis and requires skilled instruction to progress to safe use. She has skin integrity changes with progressing wear too rapidly and improper prosthetic care. She has weakness and decreased flexibility associated with prolonged limited mobility. Her balance is impaired placing her at high risk of falls with dependency on RW & supervision. Berg Balance 10/56 indicates high fall risk and dependency in ADLs. Timed Up-Go of 35.93 sec indicates fall risk & ADL dependency also. Her gait is limited and dependent for safety.             Rehab Potential Good   PT Frequency 2x / week   PT Duration 8 weeks   PT Treatment/Interventions ADLs/Self Care Home Management;DME Instruction;Gait training;Stair training;Functional mobility training;Therapeutic activities;Therapeutic exercise;Balance training;Neuromuscular re-education;Patient/family education;Prosthetic Training;Vestibular   PT Next Visit Plan review prosthetic care, instruct in prosthetic gait with RW, HEP for mid-line at  sink   Consulted and Agree with Plan of Care Patient      Patient will benefit from skilled therapeutic intervention in order to improve the following deficits and impairments:  Abnormal gait, Decreased activity tolerance, Decreased  balance, Decreased endurance, Decreased knowledge of use of DME, Decreased mobility, Impaired flexibility, Decreased strength, Postural dysfunction, Prosthetic Dependency, Pain  Visit Diagnosis: Other abnormalities of gait and mobility  Unsteadiness on feet  Muscle weakness (generalized)  Pain in left lower leg  Other symptoms and signs involving the musculoskeletal system      G-Codes - 05-14-2015 March 27, 2224    Functional Assessment Tool Used Patient is dependent in proper prosthetic care. She progressed wear too rapidly and skin issues with risk of breakdown.    Functional Limitation Self care   Self Care Current Status [phone] At least 40 percent but less than 60 percent impaired, limited or restricted   Self Care Goal Status [id number]) At least 1 percent but less than 20 percent impaired, limited or restricted       Problem List Patient Active Problem List   Diagnosis Date Noted  . Diastolic dysfunction [id number]  . Diabetic neuropathy ([place]) 11/10/2014  . Chronic kidney disease (CKD), stage IV (severe) ([place]) 11/10/2014  . Diabetic foot infection ([place]) 11/09/2014  . Fracture of distal femur ([place]) 07/08/2014  . Osteoporosis 07/08/2014  . Femur fracture, left ([place]) 07/08/2014  . Colles' fracture of left radius   . Fever   . ARF (acute renal failure) ([place]) 05/03/2014  . Left hip pain 05/03/2014  . Asthma 05/03/2014  . Hip pain   . CAP (community acquired pneumonia) 07/25/2012  . Perimenopausal symptoms 02/13/2012  . Hyponatremia 12/29/2011  . Asthma exacerbation attacks 12/28/2011  . Bronchitis 12/28/2011  . Repeated falls 07/30/2011  . Hip fracture, left ([place]) 07/16/2011  . Femoral neck fracture ([place]) 07/16/2011  . Tobacco use disorder 04/14/2010  . Dyslipidemia 12/23/2008  . ACUTE BRONCHITIS 11/29/2008  . DIARRHEA 08/30/2008  . FATIGUE 07/12/2008  . THRUSH 05/20/2008  . Human immunodeficiency virus (HIV) disease ([place]) 04/19/2008  . Diabetes mellitus type 2 with  complications, uncontrolled ([place]) 04/19/2008  . Mood disorder ([place]) 04/19/2008  . Hereditary and idiopathic peripheral neuropathy 04/19/2008  . Essential hypertension 04/19/2008    Jamey Reas PT, DPT May 14, 2015, 10:29 PM  [facility] [address] [place], [place], [address] Phone: [phone]   Fax:  [phone]  Name: [name] MRN: [id number] Date of Birth: [birth year]

## 2015-05-18 ENCOUNTER — Encounter: Payer: Self-pay | Admitting: Physical Therapy

## 2015-05-18 ENCOUNTER — Ambulatory Visit: Payer: Commercial Managed Care - HMO | Admitting: Physical Therapy

## 2015-05-18 DIAGNOSIS — R2681 Unsteadiness on feet: Secondary | ICD-10-CM

## 2015-05-18 DIAGNOSIS — R2689 Other abnormalities of gait and mobility: Secondary | ICD-10-CM

## 2015-05-18 DIAGNOSIS — R29898 Other symptoms and signs involving the musculoskeletal system: Secondary | ICD-10-CM

## 2015-05-18 DIAGNOSIS — M6281 Muscle weakness (generalized): Secondary | ICD-10-CM | POA: Diagnosis not present

## 2015-05-18 DIAGNOSIS — M79662 Pain in left lower leg: Secondary | ICD-10-CM | POA: Diagnosis not present

## 2015-05-18 NOTE — Patient Instructions (Signed)
Do each exercise 1-2  times per day Do each exercise 10 repetitions Hold each exercise for 5 seconds to feel your location  AT SINK FIND YOUR MIDLINE POSITION AND PLACE FEET EQUAL DISTANCE FROM THE MIDLINE.  USE TAPE ON FLOOR TO MARK THE MIDLINE POSITION. You also should try to feel with your limb pressure in socket.  You are trying to feel with limb what you used to feel with the bottom of your foot.  1. Side to Side Shift: Moving your hips only (not shoulders): move weight onto your left leg, HOLD/FEEL.  Move back to equal weight on each leg, HOLD/FEEL. Move weight onto your right leg, HOLD/FEEL. Move back to equal weight on each leg, HOLD/FEEL. Repeat. 2. Front to Back Shift: Moving your hips only (not shoulders): move your weight forward onto your toes, HOLD/FEEL. Move your weight back to equal Flat Foot on both legs, HOLD/FEEL. Move your weight back onto your heels, HOLD/FEEL. Move your weight back to equal on both legs, HOLD/FEEL. Repeat. 3. Moving Cones / Cups: With equal weight on each leg: Hold on with one hand the first time, then progress to no hand supports. Move cups from one side of sink to the other. Place cups ~2" out of your reach, progress to 10" beyond reach. 4. Overhead/Upward Reaching: alternated reaching up to top cabinets or ceiling if no cabinets present. Keep equal weight on each leg. Start with one hand support on counter while other hand reaches and progress to no hand support with reaching. 5.   Looking Over Shoulders: With equal weight on each leg: alternate turning to look          over your shoulders with one hand support on counter as needed. Shift weight to             side looking, pull hip then shoulder then head/eyes around to look behind you. Start       with one hand support & progress to no hand support. 

## 2015-05-19 ENCOUNTER — Other Ambulatory Visit: Payer: Commercial Managed Care - HMO

## 2015-05-19 NOTE — Therapy (Signed)
[facility] [address] [place], [place], [address] Phone: [phone]   Fax:  [phone]  Physical Therapy Treatment  Patient Details  Name: [name] MRN: [id number] Date of Birth: [birth year] Referring Provider: Frankey Shown, MD  Encounter Date: 05/18/2015      PT End of Session - 05/18/15 1454    Visit Number 2   Number of Visits 17   Date for PT Re-Evaluation 07/08/15   Authorization Type Medicare G-Code & progress report every 10 visits   PT Start Time 1446   PT Stop Time 1528   PT Time Calculation (min) 42 min   Equipment Utilized During Treatment Gait belt   Activity Tolerance Patient tolerated treatment well   Behavior During Therapy [facility] for tasks assessed/performed      Past Medical History  Diagnosis Date  . Anxiety   . Depression   . Hypertension   . HIV infection ([name]) 2000  . Asthma     HOSPITALIZED WITH EXCERBATION OF ASTHMA - AND BRONCHITIS AND THE FLU DEC 2013  . Shortness of breath     ALLERGIES ARE "ACTING UP"  . Diabetes mellitus     ON INSULIN AND ORAL MEDICATIONS  . GERD (gastroesophageal reflux disease)   . Neuropathy     NEUROPATHY HANDS AND FEET  . Arthritis     LEFT HIP  . Pain     SEVERE PAIN LEFT HIP - HX OF LEFT HIP PINNING JULY 2013    Past Surgical History  Procedure Laterality Date  . Hip pinning,cannulated  07/16/2011    Procedure: CANNULATED HIP PINNING;  Surgeon: Gearlean Alf, MD;  [facility]: [facility];  Service: Orthopedics;  Laterality: Left;  . Hip pinning,cannulated Left 04/09/2012    Procedure: CANNULATED HIP PINNING AND HARDWARE REVISION;  Surgeon: Gearlean Alf, MD;  [facility];  Service: Orthopedics;  Laterality: Left;  . Hip arthroplasty Left 05/05/2014    Procedure: ARTHROPLASTY OPEN REDUCTION INTERNAL FIXATION LEFT HIP AND REMOVAL OF TWO CANNULATED SCREW;  Surgeon: Latanya Maudlin, MD;  [facility];  Service: Orthopedics;  Laterality: Left;   . Cast application Left [id number]    Procedure: CAST APPLICATION (FIBERGLASS);  Surgeon: Latanya Maudlin, MD;  [facility];  Service: Orthopedics;  Laterality: Left;  CLOSED REDUCTION OF LEFT COLLES FRACTURE WITH SHORT ARM CAST  . Hardware removal Left 05/05/2014    Procedure: HARDWARE REMOVAL LEFT HIP;  Surgeon: Latanya Maudlin, MD;  [facility];  Service: Orthopedics;  Laterality: Left;  REMOVAL BIOMET 6.5-8.0 CANNULATED SCREW  . Peripheral vascular catheterization N/A 11/15/2014    Procedure: Abdominal Aortogram;  Surgeon: Conrad Baker, MD;  Location: [facility];  Service: Cardiovascular;  Laterality: N/A;  . Peripheral vascular catheterization  11/15/2014    Procedure: Lower Extremity Angiography;  Surgeon: Conrad [place], MD;  Location: [facility];  Service: Cardiovascular;;  . Peripheral vascular catheterization Left 11/15/2014    Procedure: Peripheral Vascular Intervention;  Surgeon: Conrad Darlington, MD;  Location: [facility];  Service: Cardiovascular;  Laterality: Left;  popliteal artery stenting  . I&d extremity Left 11/16/2014    Procedure:  DEBRIDEMENT OF LEFT FOOT POSSIBLE APPLICATION OF INTEGRIA AND VAC ;  Surgeon: Irene Limbo, MD;  Location: [facility];  Service: Plastics;  Laterality: Left;  . Amputation Left 11/18/2014    Procedure: LEFT BELOW KNEE AMPUTATION;  Surgeon: Leandrew Koyanagi, MD;  Location: [facility];  Service: Orthopedics;  Laterality: Left;    There were no vitals filed for this visit.      Subjective Assessment - 05/18/15 1448    Subjective No new falls to report. Did feel a pop in her lower back this am that increased her pain, feeling better now. No pain in residual limb.    Pertinent History DM2, neuropathy, HIV, mood disorder, HTN, CKD (stage IV), asthma, arthritis, left hip fracture with ORIF 07/16/2011 and hardware removal with new pinning 05/05/2014   Limitations Lifting;Standing;Walking   Patient Stated Goals to use prosthesis to go in  community including shopping, church, [place] meetings   Currently in Pain? Yes   Pain Score 6    Pain Location Back   Pain Orientation Lower;Right   Pain Descriptors / Indicators Sharp   Pain Type Acute pain   Pain Onset Today   Pain Frequency Constant   Aggravating Factors  bent forward in shower this am, and felt it pop.    Pain Relieving Factors pain medication            [facility] Adult PT Treatment/Exercise - 05/18/15 1455    Transfers   Transfers Sit to Stand;Stand to Sit   Sit to Stand 5: Supervision;With upper extremity assist;With armrests;From chair/3-in-1   Stand to Sit 5: Supervision;With upper extremity assist;With armrests;To chair/3-in-1   Ambulation/Gait   Ambulation/Gait Yes   Ambulation/Gait Assistance 5: Supervision;4: Min guard   Ambulation/Gait Assistance Details cues on posture and to increase step length. Pt with lateral lean of pylon that improved slightly with increased base of support.                           Ambulation Distance (Feet) 30 Feet   Assistive device Rolling walker;Prosthesis   Gait Pattern Step-through pattern;Decreased stance time - left;Decreased step length - right;Decreased step length - left;Narrow base of support;Trunk flexed   Prosthetics   Current prosthetic wear tolerance (days/week)  daily   Current prosthetic wear tolerance (#hours/day)  6 hours 2x day with 2 hour break in between   Residual limb condition  intact with dry skin area, some areas of concern for blisters to develope.   Education Provided Residual limb care;Proper weight-bearing schedule/adjustment;Proper wear schedule/adjustment;Correct ply sock adjustment  increased drying;sink HEP for proprioception   Person(s) Educated Patient   Education Method Explanation;Demonstration;Verbal cues   Education Method Verbalized understanding;Verbal cues required;Needs further instruction   Donning Prosthesis Supervision   Doffing Prosthesis Supervision            PT Education  - 05/18/15 1524    Education provided Yes   Education Details HEP: sink HEP for proprioception and balance   Person(s) Educated Patient   Methods Explanation;Demonstration;Verbal cues;Handout   Comprehension Verbalized understanding;Returned demonstration;Verbal cues required;Need further instruction          PT Short Term Goals - 05/11/15 2222    PT SHORT TERM GOAL #1   Title Patient tolerates wear of prosthesis >12hrs/day without skin issues. (Target Date: 06/10/2015)   Time 4   Period Weeks   Status New   PT SHORT TERM GOAL #2   Title Patient properly donnes prosthesis and verbalizes proper adjustment of wear schedule. (Target Date: 06/10/2015)   Time 4   Period Weeks   Status New   PT SHORT TERM GOAL #3   Title Patient ambulates 100' with RW & prosthesis with supervision. (Target Date: 06/10/2015)   Time 4   Period Weeks  Status New   PT SHORT TERM GOAL #4   Title Patient negotiates ramps & curbs with RW & prosthesis with minA. (Target Date: 06/10/2015)   Time 4   Period Weeks   Status New   PT SHORT TERM GOAL #5   Title Patient performs standing balance activities with RW support reaching 10" all directions, to floor and manages clothes safely. (Target Date: 06/10/2015)   Time 4   Period Weeks   Status New           PT Long Term Goals - 05/11/15 2216    PT LONG TERM GOAL #1   Title Patient tolerates wear of prosthesis >90% of awake hours without skin issues or limb tenderness. (Target Date: 07/08/2015)   Time 8   Period Weeks   Status New   PT LONG TERM GOAL #2   Title Patient verbalizes & demonstrates understanding of prosthetic care to enable safe use of prosthesis.  (Target Date: 07/08/2015)   Time 8   Period Weeks   Status New   PT LONG TERM GOAL #3   Title Berg Balance > 24/56 to indicate lower fall risk.  (Target Date: 07/08/2015)   Time 8   Period Weeks   Status New   PT LONG TERM GOAL #4   Title Timed Up & Go <20 seconds modified independent safely.  (Target  Date: 07/08/2015)   Time 8   Period Weeks   Status New   PT LONG TERM GOAL #5   Title Patient ambulates 250' with LRAD & prosthesis modified independent to enable safe community mobility.  (Target Date: 07/08/2015)   Time 8   Period Weeks   Status New   Additional Long Term Goals   Additional Long Term Goals Yes   PT LONG TERM GOAL #6   Title Patient negotiates ramps & curbs with LRAD & prosthesis modified indpendent to enable safe community mobility.  (Target Date: 07/08/2015)   Time 8   Period Weeks   Status New            Plan - 05/18/15 1455    Clinical Impression Statement today's skilled session worked on prosthetic management and initiation of sink HEP for balance and proprioception. No issues reported during session today.   Rehab Potential Good   PT Frequency 2x / week   PT Duration 8 weeks   PT Treatment/Interventions ADLs/Self Care Home Management;DME Instruction;Gait training;Stair training;Functional mobility training;Therapeutic activities;Therapeutic exercise;Balance training;Neuromuscular re-education;Patient/family education;Prosthetic Training;Vestibular   PT Next Visit Plan review prosthetic care, instruct in prosthetic gait with RW, initiate barriers as able   Consulted and Agree with Plan of Care Patient      Patient will benefit from skilled therapeutic intervention in order to improve the following deficits and impairments:  Abnormal gait, Decreased activity tolerance, Decreased balance, Decreased endurance, Decreased knowledge of use of DME, Decreased mobility, Impaired flexibility, Decreased strength, Postural dysfunction, Prosthetic Dependency, Pain  Visit Diagnosis: Other abnormalities of gait and mobility  Unsteadiness on feet  Muscle weakness (generalized)  Pain in left lower leg  Other symptoms and signs involving the musculoskeletal system     Problem List Patient Active Problem List   Diagnosis Date Noted  . Diastolic dysfunction  99991111  . Diabetic neuropathy ([place]) 11/10/2014  . Chronic kidney disease (CKD), stage IV (severe) ([place]) 11/10/2014  . Diabetic foot infection ([place]) 11/09/2014  . Fracture of distal femur ([place]) 07/08/2014  . Osteoporosis 07/08/2014  . Femur fracture, left ([place]) 07/08/2014  .  Colles' fracture of left radius   . Fever   . ARF (acute renal failure) ([place]) 05/03/2014  . Left hip pain 05/03/2014  . Asthma 05/03/2014  . Hip pain   . CAP (community acquired pneumonia) 07/25/2012  . Perimenopausal symptoms 02/13/2012  . Hyponatremia 12/29/2011  . Asthma exacerbation attacks 12/28/2011  . Bronchitis 12/28/2011  . Repeated falls 07/30/2011  . Hip fracture, left ([place]) 07/16/2011  . Femoral neck fracture ([place]) 07/16/2011  . Tobacco use disorder 04/14/2010  . Dyslipidemia 12/23/2008  . ACUTE BRONCHITIS 11/29/2008  . DIARRHEA 08/30/2008  . FATIGUE 07/12/2008  . THRUSH 05/20/2008  . Human immunodeficiency virus (HIV) disease ([place]) 04/19/2008  . Diabetes mellitus type 2 with complications, uncontrolled ([place]) 04/19/2008  . Mood disorder ([place]) 04/19/2008  . Hereditary and idiopathic peripheral neuropathy 04/19/2008  . Essential hypertension 04/19/2008    Willow Ora, PTA, [facility] [address] [place], [place] [address] [phone] 05/19/2015, 4:05 PM   Name: [name] MRN: [id number] Date of Birth: 20-Oct-1965

## 2015-05-20 ENCOUNTER — Other Ambulatory Visit: Payer: Commercial Managed Care - HMO

## 2015-05-20 ENCOUNTER — Ambulatory Visit: Payer: Commercial Managed Care - HMO | Admitting: Physical Therapy

## 2015-05-20 ENCOUNTER — Other Ambulatory Visit: Payer: Self-pay | Admitting: Infectious Diseases

## 2015-05-20 DIAGNOSIS — Z79899 Other long term (current) drug therapy: Secondary | ICD-10-CM

## 2015-05-20 DIAGNOSIS — R29898 Other symptoms and signs involving the musculoskeletal system: Secondary | ICD-10-CM | POA: Diagnosis not present

## 2015-05-20 DIAGNOSIS — D631 Anemia in chronic kidney disease: Secondary | ICD-10-CM

## 2015-05-20 DIAGNOSIS — Z113 Encounter for screening for infections with a predominantly sexual mode of transmission: Secondary | ICD-10-CM | POA: Diagnosis not present

## 2015-05-20 DIAGNOSIS — M6281 Muscle weakness (generalized): Secondary | ICD-10-CM

## 2015-05-20 DIAGNOSIS — B20 Human immunodeficiency virus [HIV] disease: Secondary | ICD-10-CM

## 2015-05-20 DIAGNOSIS — R2689 Other abnormalities of gait and mobility: Secondary | ICD-10-CM

## 2015-05-20 DIAGNOSIS — M79662 Pain in left lower leg: Secondary | ICD-10-CM

## 2015-05-20 DIAGNOSIS — R2681 Unsteadiness on feet: Secondary | ICD-10-CM

## 2015-05-20 DIAGNOSIS — N189 Chronic kidney disease, unspecified: Secondary | ICD-10-CM | POA: Diagnosis not present

## 2015-05-20 LAB — COMPREHENSIVE METABOLIC PANEL
ALK PHOS: 77 U/L (ref 33–130)
ALT: 14 U/L (ref 6–29)
AST: 15 U/L (ref 10–35)
Albumin: 3.8 g/dL (ref 3.6–5.1)
BUN: 30 mg/dL — ABNORMAL HIGH (ref 7–25)
CALCIUM: 9.2 mg/dL (ref 8.6–10.4)
CHLORIDE: 103 mmol/L (ref 98–110)
CO2: 28 mmol/L (ref 20–31)
Creat: 1.79 mg/dL — ABNORMAL HIGH (ref 0.50–1.05)
GLUCOSE: 64 mg/dL — AB (ref 65–99)
POTASSIUM: 4 mmol/L (ref 3.5–5.3)
Sodium: 143 mmol/L (ref 135–146)
Total Bilirubin: 0.2 mg/dL (ref 0.2–1.2)
Total Protein: 6.5 g/dL (ref 6.1–8.1)

## 2015-05-20 LAB — LIPID PANEL
CHOL/HDL RATIO: 3.1 ratio (ref <=5.0)
CHOLESTEROL: 135 mg/dL (ref 125–200)
HDL: 44 mg/dL — ABNORMAL LOW (ref >=46)
LDL Cholesterol: 62 mg/dL (ref <130)
Triglycerides: 143 mg/dL (ref <150)
VLDL: 29 mg/dL (ref <30)

## 2015-05-20 LAB — FERRITIN: FERRITIN: 25 ng/mL (ref 10–232)

## 2015-05-20 LAB — IRON AND TIBC
%SAT: 8 % — AB (ref 11–50)
Iron: 33 ug/dL — ABNORMAL LOW (ref 45–160)
TIBC: 404 ug/dL (ref 250–450)
UIBC: 371 ug/dL (ref 125–400)

## 2015-05-20 LAB — CBC
HEMATOCRIT: 41 % (ref 35.0–45.0)
Hemoglobin: 13.1 g/dL (ref 11.7–15.5)
MCH: 28.3 pg (ref 27.0–33.0)
MCHC: 32 g/dL (ref 32.0–36.0)
MCV: 88.6 fL (ref 80.0–100.0)
MPV: 10.5 fL (ref 7.5–12.5)
Platelets: 283 10*3/uL (ref 140–400)
RBC: 4.63 MIL/uL (ref 3.80–5.10)
RDW: 16.8 % — AB (ref 11.0–15.0)
WBC: 9.3 10*3/uL (ref 3.8–10.8)

## 2015-05-20 LAB — T-HELPER CELL (CD4) - (RCID CLINIC ONLY)
CD4 % Helper T Cell: 29 % — ABNORMAL LOW (ref 33–55)
CD4 T CELL ABS: 870 /uL (ref 400–2700)

## 2015-05-20 LAB — PHOSPHORUS: PHOSPHORUS: 3.1 mg/dL (ref 2.5–4.5)

## 2015-05-21 LAB — RPR

## 2015-05-22 NOTE — Therapy (Signed)
[facility] [address] [place], [place], [address] Phone: [phone]   Fax:  [phone]  Physical Therapy Treatment  Patient Details  Name: PAMEL WOODWARD MRN: [id number] Date of Birth: 29-Nov-1965 Referring Provider: Frankey Shown, MD  Encounter Date: 05/20/2015     05/20/15 0810  PT Visits / Re-Eval  Visit Number 3  Number of Visits 17  Date for PT Re-Evaluation 07/08/15  Authorization  Authorization Type Medicare G-Code & progress report every 10 visits  PT Time Calculation  PT Start Time 0803  PT Stop Time 0845  PT Time Calculation (min) 42 min  PT - End of Session  Equipment Utilized During Treatment Gait belt  Activity Tolerance Patient tolerated treatment well  Behavior During Therapy [facility] for tasks assessed/performed    Past Medical History  Diagnosis Date  . Anxiety   . Depression   . Hypertension   . HIV infection ([place]) 2000  . Asthma     HOSPITALIZED WITH EXCERBATION OF ASTHMA - AND BRONCHITIS AND THE FLU DEC 2013  . Shortness of breath     ALLERGIES ARE "ACTING UP"  . Diabetes mellitus     ON INSULIN AND ORAL MEDICATIONS  . GERD (gastroesophageal reflux disease)   . Neuropathy     NEUROPATHY HANDS AND FEET  . Arthritis     LEFT HIP  . Pain     SEVERE PAIN LEFT HIP - HX OF LEFT HIP PINNING JULY 2013    Past Surgical History  Procedure Laterality Date  . Hip pinning,cannulated  07/16/2011    Procedure: CANNULATED HIP PINNING;  Surgeon: Gearlean Alf, MD;  [facility];  Service: Orthopedics;  Laterality: Left;  . Hip pinning,cannulated Left 04/09/2012    Procedure: CANNULATED HIP PINNING AND HARDWARE REVISION;  Surgeon: Gearlean Alf, MD;  [facility];  Service: Orthopedics;  Laterality: Left;  . Hip arthroplasty Left 05/05/2014    Procedure: ARTHROPLASTY OPEN REDUCTION INTERNAL FIXATION LEFT HIP AND REMOVAL OF TWO CANNULATED SCREW;  Surgeon: Latanya Maudlin, MD;  [facility];  Service: Orthopedics;  Laterality: Left;  . Cast application Left [id number]    Procedure: CAST APPLICATION (FIBERGLASS);  Surgeon: Latanya Maudlin, MD;  [facility];  Service: Orthopedics;  Laterality: Left;  CLOSED REDUCTION OF LEFT COLLES FRACTURE WITH SHORT ARM CAST  . Hardware removal Left 05/05/2014    Procedure: HARDWARE REMOVAL LEFT HIP;  Surgeon: Latanya Maudlin, MD;  [facility];  Service: Orthopedics;  Laterality: Left;  REMOVAL BIOMET 6.5-8.0 CANNULATED SCREW  . Peripheral vascular catheterization N/A 11/15/2014    Procedure: Abdominal Aortogram;  Surgeon: Conrad [place], MD;  Location: [facility];  Service: Cardiovascular;  Laterality: N/A;  . Peripheral vascular catheterization  11/15/2014    Procedure: Lower Extremity Angiography;  Surgeon: Conrad [place], MD;  Location: [facility];  Service: Cardiovascular;;  . Peripheral vascular catheterization Left 11/15/2014    Procedure: Peripheral Vascular Intervention;  Surgeon: Conrad [place], MD;  Location: [facility];  Service: Cardiovascular;  Laterality: Left;  popliteal artery stenting  . I&d extremity Left 11/16/2014    Procedure:  DEBRIDEMENT OF LEFT FOOT POSSIBLE APPLICATION OF INTEGRIA AND VAC ;  Surgeon: Irene Limbo, MD;  Location: [facility];  Service: Plastics;  Laterality: Left;  . Amputation Left 11/18/2014    Procedure: LEFT BELOW KNEE AMPUTATION;  Surgeon: Leandrew Koyanagi, MD;  Location: [facility];  Service: Orthopedics;  Laterality:  Left;    There were no vitals filed for this visit.     05/20/15 0807  Symptoms/Limitations  Subjective No new complaints. Back is a little bit better. Has had a chance to do the sink HEP and they are going well.   Pertinent History DM2, neuropathy, HIV, mood disorder, HTN, CKD (stage IV), asthma, arthritis, left hip fracture with ORIF 07/16/2011 and hardware removal with new pinning 05/05/2014  Limitations Lifting;Standing;Walking  Patient Stated Goals to use prosthesis  to go in community including shopping, church, [employer] meetings  Pain Assessment  Currently in Pain? Yes  Pain Score 9  Pain Location Head  Pain Orientation Lateral;Anterior;Upper  Pain Descriptors / Indicators Headache;Aching  Pain Type Acute pain  Pain Onset Today  Pain Frequency Constant  Aggravating Factors  stress from today  Pain Relieving Factors rest, dark room      05/20/15 0810  Transfers  Transfers Sit to Stand;Stand to Sit  Sit to Stand 5: Supervision;With upper extremity assist;With armrests;From chair/3-in-1  Sit to Stand Details Verbal cues for sequencing;Verbal cues for technique;Verbal cues for safe use of DME/AE  Sit to Stand Details (indicate cue type and reason) cues on hand placement and for anterior weight shifting with standing up  Stand to Sit 5: Supervision;With upper extremity assist;With armrests;To chair/3-in-1  Stand to Sit Details (indicate cue type and reason) Verbal cues for technique;Verbal cues for sequencing;Verbal cues for safe use of DME/AE;Verbal cues for precautions/safety  Stand to Sit Details cues to back all the way to the surface and reach back prior to sitting down  Ambulation/Gait  Ambulation/Gait Yes  Ambulation/Gait Assistance 5: Supervision;4: Min guard  Ambulation/Gait Assistance Details cues on step lenght and base of support. cues on posture and walker position with gait.   Ambulation Distance (Feet) 70 Feet (x1, 60 x1)  Assistive device Rolling walker;Prosthesis  Gait Pattern Step-through pattern;Decreased stance time - left;Decreased step length - right;Decreased step length - left;Narrow base of support;Trunk flexed  Ambulation Surface Level;Indoor  Stairs Yes  Stairs Assistance 4: Min guard  Stairs Assistance Details (indicate cue type and reason) demo'd for pt prior to pt performance, cues needed on sequencing and to adavance hands along the rails  Stair Management Technique Two rails;Step to pattern;Forwards  Number of Stairs 4   Ramp 4: Min assist (with RW/prosthesis)  Ramp Details (indicate cue type and reason) demo'd prior to pt performance, cues needed for posture and sequencing  Curb 4: Min assist  Curb Details (indicate cue type and reason) demo'd prior to pt performance, cues on sequencing and technique  Prosthetics  Prosthetic Care Comments  Tegaderm applied to open blister area. Pt educated in use of antiperserant from knee down and use of baby/mineral oil from knee above to assist with decreased sweating and heat rash. Pt reports she has already increased her drying throughtout day.                        Current prosthetic wear tolerance (days/week)  daily  Current prosthetic wear tolerance (#hours/day)  6 hours 2x day with 2 hour break in between  Residual limb condition  small open blister at distal end of limb, heat rash present from knee down.  Education Provided Residual limb care;Correct ply sock adjustment;Proper wear schedule/adjustment;Proper weight-bearing schedule/adjustment  Person(s) Educated Patient  Education Method Explanation;Demonstration;Verbal cues  Education Method Verbalized understanding;Returned demonstration;Verbal cues required;Needs further instruction  Donning Prosthesis 5  Doffing Prosthesis 5  PT Short Term Goals - 05/11/15 2222    PT SHORT TERM GOAL #1   Title Patient tolerates wear of prosthesis >12hrs/day without skin issues. (Target Date: 06/10/2015)   Time 4   Period Weeks   Status New   PT SHORT TERM GOAL #2   Title Patient properly donnes prosthesis and verbalizes proper adjustment of wear schedule. (Target Date: 06/10/2015)   Time 4   Period Weeks   Status New   PT SHORT TERM GOAL #3   Title Patient ambulates 100' with RW & prosthesis with supervision. (Target Date: 06/10/2015)   Time 4   Period Weeks   Status New   PT SHORT TERM GOAL #4   Title Patient negotiates ramps & curbs with RW & prosthesis with minA. (Target Date: 06/10/2015)   Time 4    Period Weeks   Status New   PT SHORT TERM GOAL #5   Title Patient performs standing balance activities with RW support reaching 10" all directions, to floor and manages clothes safely. (Target Date: 06/10/2015)   Time 4   Period Weeks   Status New           PT Long Term Goals - 05/11/15 2216    PT LONG TERM GOAL #1   Title Patient tolerates wear of prosthesis >90% of awake hours without skin issues or limb tenderness. (Target Date: 07/08/2015)   Time 8   Period Weeks   Status New   PT LONG TERM GOAL #2   Title Patient verbalizes & demonstrates understanding of prosthetic care to enable safe use of prosthesis.  (Target Date: 07/08/2015)   Time 8   Period Weeks   Status New   PT LONG TERM GOAL #3   Title Berg Balance > 24/56 to indicate lower fall risk.  (Target Date: 07/08/2015)   Time 8   Period Weeks   Status New   PT LONG TERM GOAL #4   Title Timed Up & Go <20 seconds modified independent safely.  (Target Date: 07/08/2015)   Time 8   Period Weeks   Status New   PT LONG TERM GOAL #5   Title Patient ambulates 250' with LRAD & prosthesis modified independent to enable safe community mobility.  (Target Date: 07/08/2015)   Time 8   Period Weeks   Status New   Additional Long Term Goals   Additional Long Term Goals Yes   PT LONG TERM GOAL #6   Title Patient negotiates ramps & curbs with LRAD & prosthesis modified indpendent to enable safe community mobility.  (Target Date: 07/08/2015)   Time 8   Period Weeks   Status New        05/20/15 0810  Plan  Clinical Impression Statement Today's session addressed gait and barriers with prosthesis and RW. Pt also with new blister on limb that needed education on care of and how to prevent more from occuring. Pt needs minimal cues at this time to correct gait deviations and to negotiate barriers. Pt is making steady progress toward goals.                                Pt will benefit from skilled therapeutic intervention in order to improve on  the following deficits Abnormal gait;Decreased activity tolerance;Decreased balance;Decreased endurance;Decreased knowledge of use of DME;Decreased mobility;Impaired flexibility;Decreased strength;Postural dysfunction;Prosthetic Dependency;Pain  Rehab Potential Good  PT Frequency 2x / week  PT Duration 8 weeks  PT Treatment/Interventions ADLs/Self Care Home Management;DME Instruction;Gait training;Stair training;Functional mobility training;Therapeutic activities;Therapeutic exercise;Balance training;Neuromuscular re-education;Patient/family education;Prosthetic Training;Vestibular  PT Next Visit Plan check wound on residual limb;continue with gait/barriers with RW and prosthesis  Consulted and Agree with Plan of Care Patient       Patient will benefit from skilled therapeutic intervention in order to improve the following deficits and impairments:  Abnormal gait, Decreased activity tolerance, Decreased balance, Decreased endurance, Decreased knowledge of use of DME, Decreased mobility, Impaired flexibility, Decreased strength, Postural dysfunction, Prosthetic Dependency, Pain  Visit Diagnosis: Other abnormalities of gait and mobility  Unsteadiness on feet  Muscle weakness (generalized)  Pain in left lower leg  Other symptoms and signs involving the musculoskeletal system     Problem List Patient Active Problem List   Diagnosis Date Noted  . Diastolic dysfunction 99991111  . Diabetic neuropathy ([place]) 11/10/2014  . Chronic kidney disease (CKD), stage IV (severe) ([place]) 11/10/2014  . Diabetic foot infection ([place]) 11/09/2014  . Fracture of distal femur ([place]) 07/08/2014  . Osteoporosis 07/08/2014  . Femur fracture, left ([place]) 07/08/2014  . Colles' fracture of left radius   . Fever   . ARF (acute renal failure) ([place]) 05/03/2014  . Left hip pain 05/03/2014  . Asthma 05/03/2014  . Hip pain   . CAP (community acquired pneumonia) 07/25/2012  . Perimenopausal symptoms 02/13/2012   . Hyponatremia 12/29/2011  . Asthma exacerbation attacks 12/28/2011  . Bronchitis 12/28/2011  . Repeated falls 07/30/2011  . Hip fracture, left ([place]) 07/16/2011  . Femoral neck fracture ([place]) 07/16/2011  . Tobacco use disorder 04/14/2010  . Dyslipidemia 12/23/2008  . ACUTE BRONCHITIS 11/29/2008  . DIARRHEA 08/30/2008  . FATIGUE 07/12/2008  . THRUSH 05/20/2008  . Human immunodeficiency virus (HIV) disease ([place]) 04/19/2008  . Diabetes mellitus type 2 with complications, uncontrolled ([place]) 04/19/2008  . Mood disorder ([place]) 04/19/2008  . Hereditary and idiopathic peripheral neuropathy 04/19/2008  . Essential hypertension 04/19/2008    Willow Ora, PTA, [facility] [address] [place], [place] [address] [phone] 05/21/2015, 11:57 PM  Name: BRYTNEY MEINDL MRN: [id number] Date of Birth: 1965/01/20

## 2015-05-23 LAB — HIV-1 RNA QUANT-NO REFLEX-BLD
HIV 1 RNA Quant: <20 copies/mL (ref <20)
HIV-1 RNA Quant, Log: <1.3 Log copies/mL (ref <1.30)

## 2015-05-24 ENCOUNTER — Ambulatory Visit: Payer: Commercial Managed Care - HMO | Admitting: Physical Therapy

## 2015-05-24 DIAGNOSIS — R29898 Other symptoms and signs involving the musculoskeletal system: Secondary | ICD-10-CM | POA: Diagnosis not present

## 2015-05-24 DIAGNOSIS — R2689 Other abnormalities of gait and mobility: Secondary | ICD-10-CM | POA: Diagnosis not present

## 2015-05-24 DIAGNOSIS — M6281 Muscle weakness (generalized): Secondary | ICD-10-CM

## 2015-05-24 DIAGNOSIS — M79662 Pain in left lower leg: Secondary | ICD-10-CM

## 2015-05-24 DIAGNOSIS — R2681 Unsteadiness on feet: Secondary | ICD-10-CM

## 2015-05-24 NOTE — Therapy (Signed)
[facility] [address] [place], [place], [address] Phone: [phone]   Fax:  [phone]  Physical Therapy Treatment  Patient Details  Name: [name] MRN: [id number] Date of Birth: 1965-01-28 Referring Provider: Frankey Shown, MD  Encounter Date: 05/24/2015      PT End of Session - 05/24/15 1250    Visit Number 4   Number of Visits 17   Date for PT Re-Evaluation 07/08/15   Authorization Type Medicare G-Code & progress report every 10 visits   PT Start Time 1232   PT Stop Time 1314   PT Time Calculation (min) 42 min   Equipment Utilized During Treatment Gait belt   Activity Tolerance Patient tolerated treatment well   Behavior During Therapy [facility] for tasks assessed/performed      Past Medical History  Diagnosis Date  . Anxiety   . Depression   . Hypertension   . HIV infection ([place]) 2000  . Asthma     HOSPITALIZED WITH EXCERBATION OF ASTHMA - AND BRONCHITIS AND THE FLU DEC 2013  . Shortness of breath     ALLERGIES ARE "ACTING UP"  . Diabetes mellitus     ON INSULIN AND ORAL MEDICATIONS  . GERD (gastroesophageal reflux disease)   . Neuropathy     NEUROPATHY HANDS AND FEET  . Arthritis     LEFT HIP  . Pain     SEVERE PAIN LEFT HIP - HX OF LEFT HIP PINNING JULY 2013    Past Surgical History  Procedure Laterality Date  . Hip pinning,cannulated  07/16/2011    Procedure: CANNULATED HIP PINNING;  Surgeon: Gearlean Alf, MD;  [facility];  Service: Orthopedics;  Laterality: Left;  . Hip pinning,cannulated Left 04/09/2012    Procedure: CANNULATED HIP PINNING AND HARDWARE REVISION;  Surgeon: Gearlean Alf, MD;  [facility];  Service: Orthopedics;  Laterality: Left;  . Hip arthroplasty Left 05/05/2014    Procedure: ARTHROPLASTY OPEN REDUCTION INTERNAL FIXATION LEFT HIP AND REMOVAL OF TWO CANNULATED SCREW;  Surgeon: Latanya Maudlin, MD;  [facility];  Service: Orthopedics;  Laterality: Left;   . Cast application Left [id number]    Procedure: CAST APPLICATION (FIBERGLASS);  Surgeon: Latanya Maudlin, MD;  [facility];  Service: Orthopedics;  Laterality: Left;  CLOSED REDUCTION OF LEFT COLLES FRACTURE WITH SHORT ARM CAST  . Hardware removal Left 05/05/2014    Procedure: HARDWARE REMOVAL LEFT HIP;  Surgeon: Latanya Maudlin, MD;  [facility];  Service: Orthopedics;  Laterality: Left;  REMOVAL BIOMET 6.5-8.0 CANNULATED SCREW  . Peripheral vascular catheterization N/A 11/15/2014    Procedure: Abdominal Aortogram;  Surgeon: Conrad [place], MD;  Location: [place] [facility];  Service: Cardiovascular;  Laterality: N/A;  . Peripheral vascular catheterization  11/15/2014    Procedure: Lower Extremity Angiography;  Surgeon: Conrad [place], MD;  Location: [facility] [place] [facility];  Service: Cardiovascular;;  . Peripheral vascular catheterization Left 11/15/2014    Procedure: Peripheral Vascular Intervention;  Surgeon: Conrad [place], MD;  Location: [place] [facility];  Service: Cardiovascular;  Laterality: Left;  popliteal artery stenting  . I&d extremity Left 11/16/2014    Procedure:  DEBRIDEMENT OF LEFT FOOT POSSIBLE APPLICATION OF INTEGRIA AND VAC ;  Surgeon: Irene Limbo, MD;  Location: [facility];  Service: Plastics;  Laterality: Left;  . Amputation Left 11/18/2014    Procedure: LEFT BELOW KNEE AMPUTATION;  Surgeon: Leandrew Koyanagi, MD;  Location: [facility];  Service: Orthopedics;  Laterality: Left;    There were no vitals filed for this visit.      Subjective Assessment - 05/24/15 1237    Subjective Pt reports fall yesterday doing laundry, reaching up and down fell on R shoulder used L wrist against wall to maintain balance, neck pain, patient continues to have pain in L wrist, no significant pain, no reddness or discoloration. Pt reports no skin issues on residual limb. Saw prosthsists last week. Wearing 6 hrs 2x a day with a 2 hr break midday.    Pertinent History DM2, neuropathy, HIV, mood disorder,  HTN, CKD (stage IV), asthma, arthritis, left hip fracture with ORIF 07/16/2011 and hardware removal with new pinning 05/05/2014   Limitations Lifting;Standing;Walking   Patient Stated Goals to use prosthesis to go in community including shopping, church, [place] meetings   Pain Onset Today             Prosthetics Assessment - 05/24/15 0001    Prosthetics   Donning prosthesis  Supervision   Doffing prosthesis  Supervision                    [facility] Adult PT Treatment/Exercise - 05/24/15 1312    Transfers   Transfers Sit to Stand;Stand to Sit   Sit to Stand 5: Supervision;With upper extremity assist;With armrests;From chair/3-in-1   Sit to Stand Details --   Stand to Sit 5: Supervision;With upper extremity assist;With armrests;To chair/3-in-1   Stand to Sit Details (indicate cue type and reason) --   Ambulation/Gait   Ambulation/Gait Yes   Ambulation/Gait Assistance 5: Supervision;4: Min guard   Ambulation/Gait Assistance Details verbal cues to increase step length and progress rolling walker with ambulation, visual and verbal cues for equal step width, pt shows adducted and centered RLE    Ambulation Distance (Feet) 120 Feet  60x2   Assistive device Rolling walker;Prosthesis   Gait Pattern Step-through pattern;Decreased stance time - left;Decreased step length - right;Decreased step length - left;Narrow base of support;Trunk flexed   Stairs --   Stairs Assistance --   Stair Management Technique --   Number of Stairs --   Ramp --   Curb --   Prosthetics   Prosthetic Care Comments  Education on sweating and itching, reinforce use of antiperspirant on residual limb. how to not scratch / rub with cloth to decrease risk of skin breaks, increase wear to all awake hours but drying every 5 hours or sooner if sweating   Current prosthetic wear tolerance (days/week)  daily   Current prosthetic wear tolerance (#hours/day)  6 hours 2x day with 2 hour break in between   Residual limb  condition  open blister at distal end shows signs of healing, blister on lateral thigh, heat rash on anterior tibia   Education Provided Residual limb care;Proper wear schedule/adjustment;Proper weight-bearing schedule/adjustment;Skin check;Prosthetic cleaning   Person(s) Educated Patient   Education Method Explanation;Demonstration   Education Method Verbalized understanding   Donning Prosthesis Supervision   Doffing Prosthesis Supervision                  PT Short Term Goals - 05/24/15 1353    PT SHORT TERM GOAL #1   Title Patient tolerates wear of prosthesis >12hrs/day without skin issues. (Target Date: 06/10/2015)   Time 4   Period Weeks   Status On-going   PT SHORT TERM GOAL #2   Title Patient properly donnes prosthesis and verbalizes proper adjustment of wear schedule. (Target Date: 06/10/2015)   Time  4   Period Weeks   Status On-going   PT SHORT TERM GOAL #3   Title Patient ambulates 41' with RW & prosthesis with supervision. (Target Date: 06/10/2015)   Time 4   Period Weeks   Status On-going   PT SHORT TERM GOAL #4   Title Patient negotiates ramps & curbs with RW & prosthesis with minA. (Target Date: 06/10/2015)   Time 4   Period Weeks   Status On-going   PT SHORT TERM GOAL #5   Title Patient performs standing balance activities with RW support reaching 10" all directions, to floor and manages clothes safely. (Target Date: 06/10/2015)   Time 4   Period Weeks   Status On-going           PT Long Term Goals - 05/24/15 1354    PT LONG TERM GOAL #1   Title Patient tolerates wear of prosthesis >90% of awake hours without skin issues or limb tenderness. (Target Date: 07/08/2015)   Time 8   Period Weeks   Status On-going   PT LONG TERM GOAL #2   Title Patient verbalizes & demonstrates understanding of prosthetic care to enable safe use of prosthesis.  (Target Date: 07/08/2015)   Time 8   Period Weeks   Status On-going   PT LONG TERM GOAL #3   Title Berg Balance >  24/56 to indicate lower fall risk.  (Target Date: 07/08/2015)   Time 8   Period Weeks   Status On-going   PT LONG TERM GOAL #4   Title Timed Up & Go <20 seconds modified independent safely.  (Target Date: 07/08/2015)   Time 8   Period Weeks   Status On-going   PT LONG TERM GOAL #5   Title Patient ambulates 250' with LRAD & prosthesis modified independent to enable safe community mobility.  (Target Date: 07/08/2015)   Time 8   Period Weeks   Status On-going   PT LONG TERM GOAL #6   Title Patient negotiates ramps & curbs with LRAD & prosthesis modified indpendent to enable safe community mobility.  (Target Date: 07/08/2015)   Time 8   Period Weeks   Status On-going               Plan - 05/24/15 1334    Clinical Impression Statement Reinforced prosthetic care and residal limb education, went over signs of sweating and gave patient strategeries to decrease mositure in leg and prevent skin breakdown. Pt ambulated with RW and required cuing for maintaining a symetrical step width. Pt shows progress with weartime and weightbearing activites in prosthesis.    Rehab Potential Good   PT Frequency 2x / week   PT Duration 8 weeks   PT Treatment/Interventions ADLs/Self Care Home Management;DME Instruction;Gait training;Stair training;Functional mobility training;Therapeutic activities;Therapeutic exercise;Balance training;Neuromuscular re-education;Patient/family education;Prosthetic Training;Vestibular   PT Next Visit Plan carry over with gait training      Patient will benefit from skilled therapeutic intervention in order to improve the following deficits and impairments:  Abnormal gait, Decreased activity tolerance, Decreased balance, Decreased endurance, Decreased knowledge of use of DME, Decreased mobility, Impaired flexibility, Decreased strength, Postural dysfunction, Prosthetic Dependency, Pain  Visit Diagnosis: No diagnosis found.     Problem List Patient Active Problem List    Diagnosis Date Noted  . Diastolic dysfunction 99991111  . Diabetic neuropathy ([place]) 11/10/2014  . Chronic kidney disease (CKD), stage IV (severe) ([place]) 11/10/2014  . Diabetic foot infection ([place]) 11/09/2014  . Fracture of distal femur ([place])  07/08/2014  . Osteoporosis 07/08/2014  . Femur fracture, left ([place]) 07/08/2014  . Colles' fracture of left radius   . Fever   . ARF (acute renal failure) ([place]) 05/03/2014  . Left hip pain 05/03/2014  . Asthma 05/03/2014  . Hip pain   . CAP (community acquired pneumonia) 07/25/2012  . Perimenopausal symptoms 02/13/2012  . Hyponatremia 12/29/2011  . Asthma exacerbation attacks 12/28/2011  . Bronchitis 12/28/2011  . Repeated falls 07/30/2011  . Hip fracture, left ([place]) 07/16/2011  . Femoral neck fracture ([place]) 07/16/2011  . Tobacco use disorder 04/14/2010  . Dyslipidemia 12/23/2008  . ACUTE BRONCHITIS 11/29/2008  . DIARRHEA 08/30/2008  . FATIGUE 07/12/2008  . THRUSH 05/20/2008  . Human immunodeficiency virus (HIV) disease ([place]) 04/19/2008  . Diabetes mellitus type 2 with complications, uncontrolled ([place]) 04/19/2008  . Mood disorder ([place]) 04/19/2008  . Hereditary and idiopathic peripheral neuropathy 04/19/2008  . Essential hypertension 04/19/2008    Dillard Essex, SPT 05/24/2015, 1:55 PM  Jamey Reas, PT, DPT PT Specializing in [place] 05/24/2015 1:55 PM Phone:  [phone]  Fax:  [phone] [place], [place] [address]  [facility] [address] [place], [place], [address] Phone: [phone]   Fax:  [phone]  Name: JADEYN MINNIS MRN: [id number] Date of Birth: 05-12-1965

## 2015-05-27 ENCOUNTER — Ambulatory Visit: Payer: Commercial Managed Care - HMO | Admitting: Physical Therapy

## 2015-05-27 ENCOUNTER — Encounter: Payer: Self-pay | Admitting: Physical Therapy

## 2015-05-27 DIAGNOSIS — M6281 Muscle weakness (generalized): Secondary | ICD-10-CM

## 2015-05-27 DIAGNOSIS — R2689 Other abnormalities of gait and mobility: Secondary | ICD-10-CM | POA: Diagnosis not present

## 2015-05-27 DIAGNOSIS — M79662 Pain in left lower leg: Secondary | ICD-10-CM | POA: Diagnosis not present

## 2015-05-27 DIAGNOSIS — R2681 Unsteadiness on feet: Secondary | ICD-10-CM

## 2015-05-27 DIAGNOSIS — R29898 Other symptoms and signs involving the musculoskeletal system: Secondary | ICD-10-CM | POA: Diagnosis not present

## 2015-05-28 NOTE — Therapy (Signed)
[facility] [address] [place], [place], [address] Phone: [phone]   Fax:  [phone]  Physical Therapy Treatment  Patient Details  Name: [name] MRN: [id number] Date of Birth: 04-04-1965 Referring Provider: Frankey Shown, MD  Encounter Date: 05/27/2015      PT End of Session - 05/27/15 1455    Visit Number 5   Number of Visits 17   Date for PT Re-Evaluation 07/08/15   Authorization Type Medicare G-Code & progress report every 10 visits   PT Start Time 1450   PT Stop Time 1530   PT Time Calculation (min) 40 min   Equipment Utilized During Treatment Gait belt   Activity Tolerance Patient tolerated treatment well   Behavior During Therapy [facility] for tasks assessed/performed      Past Medical History  Diagnosis Date  . Anxiety   . Depression   . Hypertension   . HIV infection ([place]) 2000  . Asthma     HOSPITALIZED WITH EXCERBATION OF ASTHMA - AND BRONCHITIS AND THE FLU DEC 2013  . Shortness of breath     ALLERGIES ARE "ACTING UP"  . Diabetes mellitus     ON INSULIN AND ORAL MEDICATIONS  . GERD (gastroesophageal reflux disease)   . Neuropathy     NEUROPATHY HANDS AND FEET  . Arthritis     LEFT HIP  . Pain     SEVERE PAIN LEFT HIP - HX OF LEFT HIP PINNING JULY 2013    Past Surgical History  Procedure Laterality Date  . Hip pinning,cannulated  07/16/2011    Procedure: CANNULATED HIP PINNING;  Surgeon: Gearlean Alf, MD;  [facility];  Service: Orthopedics;  Laterality: Left;  . Hip pinning,cannulated Left 04/09/2012    Procedure: CANNULATED HIP PINNING AND HARDWARE REVISION;  Surgeon: Gearlean Alf, MD;  [facility];  Service: Orthopedics;  Laterality: Left;  . Hip arthroplasty Left 05/05/2014    Procedure: ARTHROPLASTY OPEN REDUCTION INTERNAL FIXATION LEFT HIP AND REMOVAL OF TWO CANNULATED SCREW;  Surgeon: Latanya Maudlin, MD;  [facility];  Service: Orthopedics;  Laterality: Left;   . Cast application Left [id number]    Procedure: CAST APPLICATION (FIBERGLASS);  Surgeon: Latanya Maudlin, MD;  [facility];  Service: Orthopedics;  Laterality: Left;  CLOSED REDUCTION OF LEFT COLLES FRACTURE WITH SHORT ARM CAST  . Hardware removal Left 05/05/2014    Procedure: HARDWARE REMOVAL LEFT HIP;  Surgeon: Latanya Maudlin, MD;  [facility];  Service: Orthopedics;  Laterality: Left;  REMOVAL BIOMET 6.5-8.0 CANNULATED SCREW  . Peripheral vascular catheterization N/A 11/15/2014    Procedure: Abdominal Aortogram;  Surgeon: Conrad [place], MD;  Location: [place] [facility];  Service: Cardiovascular;  Laterality: N/A;  . Peripheral vascular catheterization  11/15/2014    Procedure: Lower Extremity Angiography;  Surgeon: Conrad [place], MD;  Location: [facility];  Service: Cardiovascular;;  . Peripheral vascular catheterization Left 11/15/2014    Procedure: Peripheral Vascular Intervention;  Surgeon: Conrad [place], MD;  Location: [facility];  Service: Cardiovascular;  Laterality: Left;  popliteal artery stenting  . I&d extremity Left 11/16/2014    Procedure:  DEBRIDEMENT OF LEFT FOOT POSSIBLE APPLICATION OF INTEGRIA AND VAC ;  Surgeon: Irene Limbo, MD;  Location: [facility];  Service: Plastics;  Laterality: Left;  . Amputation Left 11/18/2014    Procedure: LEFT BELOW KNEE AMPUTATION;  Surgeon: Leandrew Koyanagi, MD;  Location: [facility];  Service: Orthopedics;  Laterality: Left;    There were no vitals filed for this visit.      Subjective Assessment - 05/27/15 1453    Subjective Still sore from last fall. No more falls to report. Also, she reports being dizzy today due to low sugar. Was 37 this am, took medicine and ate PB sandwhich/yogurt/glass of OJ. Recheck had it at 8 prior to leaving for PT.    Patient is accompained by: Family member   Pertinent History DM2, neuropathy, HIV, mood disorder, HTN, CKD (stage IV), asthma, arthritis, left hip fracture with ORIF 07/16/2011 and  hardware removal with new pinning 05/05/2014   Limitations Lifting;Standing;Walking   Patient Stated Goals to use prosthesis to go in community including shopping, church, [place] meetings   Currently in Pain? Yes   Pain Score 6    Pain Location Generalized  right side   Pain Orientation Right   Pain Type Acute pain   Pain Onset In the past 7 days   Pain Frequency Constant   Aggravating Factors  from fall this week   Pain Relieving Factors rest, cold            [facility] Adult PT Treatment/Exercise - 05/27/15 1456    Transfers   Transfers Sit to Stand;Stand to Sit   Sit to Stand 5: Supervision;With upper extremity assist;With armrests;From chair/3-in-1   Sit to Stand Details Verbal cues for technique;Verbal cues for safe use of DME/AE   Stand to Sit 5: Supervision;With upper extremity assist;With armrests;To chair/3-in-1   Stand to Sit Details (indicate cue type and reason) Verbal cues for safe use of DME/AE;Verbal cues for precautions/safety   Ambulation/Gait   Ambulation/Gait Yes   Ambulation/Gait Assistance 5: Supervision   Ambulation Distance (Feet) 120 Feet  x1, 60 x2   Assistive device Rolling walker;Prosthesis   Gait Pattern Step-through pattern;Decreased stance time - left;Decreased step length - right;Decreased step length - left;Narrow base of support;Trunk flexed   Ambulation Surface Level;Indoor   Prosthetics   Prosthetic Care Comments  pt has been using antipersperant for 2 days now without any issues.    Current prosthetic wear tolerance (days/week)  daily   Current prosthetic wear tolerance (#hours/day)  wearing all awake hours with drying every 5 hours and prn   Residual limb condition  both blister at distal end and lateral thigh are healing, minimal heat rash present   Education Provided Residual limb care;Proper weight-bearing schedule/adjustment   Person(s) Educated Patient   Education Method Demonstration;Explanation;Verbal cues   Education Method Verbalized  understanding;Needs further instruction;Verbal cues required   Donning Prosthesis Supervision   Doffing Prosthesis Supervision            PT Short Term Goals - 05/24/15 1353    PT SHORT TERM GOAL #1   Title Patient tolerates wear of prosthesis >12hrs/day without skin issues. (Target Date: 06/10/2015)   Time 4   Period Weeks   Status On-going   PT SHORT TERM GOAL #2   Title Patient properly donnes prosthesis and verbalizes proper adjustment of wear schedule. (Target Date: 06/10/2015)   Time 4   Period Weeks   Status On-going   PT SHORT TERM GOAL #3   Title Patient ambulates 3' with RW & prosthesis with supervision. (Target Date: 06/10/2015)   Time 4   Period Weeks   Status On-going   PT SHORT TERM GOAL #4   Title Patient negotiates ramps & curbs with RW & prosthesis with minA. (Target Date: 06/10/2015)   Time 4  Period Weeks   Status On-going   PT SHORT TERM GOAL #5   Title Patient performs standing balance activities with RW support reaching 10" all directions, to floor and manages clothes safely. (Target Date: 06/10/2015)   Time 4   Period Weeks   Status On-going           PT Long Term Goals - 05/24/15 1354    PT LONG TERM GOAL #1   Title Patient tolerates wear of prosthesis >90% of awake hours without skin issues or limb tenderness. (Target Date: 07/08/2015)   Time 8   Period Weeks   Status On-going   PT LONG TERM GOAL #2   Title Patient verbalizes & demonstrates understanding of prosthetic care to enable safe use of prosthesis.  (Target Date: 07/08/2015)   Time 8   Period Weeks   Status On-going   PT LONG TERM GOAL #3   Title Berg Balance > 24/56 to indicate lower fall risk.  (Target Date: 07/08/2015)   Time 8   Period Weeks   Status On-going   PT LONG TERM GOAL #4   Title Timed Up & Go <20 seconds modified independent safely.  (Target Date: 07/08/2015)   Time 8   Period Weeks   Status On-going   PT LONG TERM GOAL #5   Title Patient ambulates 250' with LRAD &  prosthesis modified independent to enable safe community mobility.  (Target Date: 07/08/2015)   Time 8   Period Weeks   Status On-going   PT LONG TERM GOAL #6   Title Patient negotiates ramps & curbs with LRAD & prosthesis modified indpendent to enable safe community mobility.  (Target Date: 07/08/2015)   Time 8   Period Weeks   Status On-going            Plan - 05/27/15 1456    Clinical Impression Statement Today's session continued to work on prosthetic management and gait with prosthesis/RW. Limtied activity performed today due to pt with dizziness today from low blood sugar. Despite this, pt still has made progress toward her STGs.   Rehab Potential Good   PT Frequency 2x / week   PT Duration 8 weeks   PT Treatment/Interventions ADLs/Self Care Home Management;DME Instruction;Gait training;Stair training;Functional mobility training;Therapeutic activities;Therapeutic exercise;Balance training;Neuromuscular re-education;Patient/family education;Prosthetic Training;Vestibular   PT Next Visit Plan carry over with gait training      Patient will benefit from skilled therapeutic intervention in order to improve the following deficits and impairments:  Abnormal gait, Decreased activity tolerance, Decreased balance, Decreased endurance, Decreased knowledge of use of DME, Decreased mobility, Impaired flexibility, Decreased strength, Postural dysfunction, Prosthetic Dependency, Pain  Visit Diagnosis: Other abnormalities of gait and mobility  Unsteadiness on feet  Muscle weakness (generalized)  Pain in left lower leg  Other symptoms and signs involving the musculoskeletal system     Problem List Patient Active Problem List   Diagnosis Date Noted  . Diastolic dysfunction 99991111  . Diabetic neuropathy ([place]) 11/10/2014  . Chronic kidney disease (CKD), stage IV (severe) ([place]) 11/10/2014  . Diabetic foot infection ([place]) 11/09/2014  . Fracture of distal femur ([place]) 07/08/2014  .  Osteoporosis 07/08/2014  . Femur fracture, left ([place]) 07/08/2014  . Colles' fracture of left radius   . Fever   . ARF (acute renal failure) ([place]) 05/03/2014  . Left hip pain 05/03/2014  . Asthma 05/03/2014  . Hip pain   . CAP (community acquired pneumonia) 07/25/2012  . Perimenopausal symptoms 02/13/2012  . Hyponatremia 12/29/2011  .  Asthma exacerbation attacks 12/28/2011  . Bronchitis 12/28/2011  . Repeated falls 07/30/2011  . Hip fracture, left ([place]) 07/16/2011  . Femoral neck fracture ([place]) 07/16/2011  . Tobacco use disorder 04/14/2010  . Dyslipidemia 12/23/2008  . ACUTE BRONCHITIS 11/29/2008  . DIARRHEA 08/30/2008  . FATIGUE 07/12/2008  . THRUSH 05/20/2008  . Human immunodeficiency virus (HIV) disease ([place]) 04/19/2008  . Diabetes mellitus type 2 with complications, uncontrolled ([place]) 04/19/2008  . Mood disorder ([place]) 04/19/2008  . Hereditary and idiopathic peripheral neuropathy 04/19/2008  . Essential hypertension 04/19/2008    Willow Ora, PTA, [facility] [address] [place], [place] [address] [phone] 05/28/2015, 12:25 AM   Name: [name] MRN: [id number] Date of Birth: [birth year]

## 2015-05-31 ENCOUNTER — Ambulatory Visit: Payer: Commercial Managed Care - HMO | Admitting: Physical Therapy

## 2015-06-02 ENCOUNTER — Ambulatory Visit: Payer: Commercial Managed Care - HMO | Admitting: Infectious Diseases

## 2015-06-02 ENCOUNTER — Ambulatory Visit: Payer: Commercial Managed Care - HMO | Admitting: Physical Therapy

## 2015-06-06 ENCOUNTER — Ambulatory Visit: Payer: Commercial Managed Care - HMO | Attending: Orthopaedic Surgery | Admitting: Physical Therapy

## 2015-06-06 ENCOUNTER — Encounter: Payer: Self-pay | Admitting: Physical Therapy

## 2015-06-06 DIAGNOSIS — M6281 Muscle weakness (generalized): Secondary | ICD-10-CM

## 2015-06-06 DIAGNOSIS — R2681 Unsteadiness on feet: Secondary | ICD-10-CM | POA: Diagnosis not present

## 2015-06-06 DIAGNOSIS — M79662 Pain in left lower leg: Secondary | ICD-10-CM | POA: Diagnosis not present

## 2015-06-06 DIAGNOSIS — R29898 Other symptoms and signs involving the musculoskeletal system: Secondary | ICD-10-CM

## 2015-06-06 DIAGNOSIS — R2689 Other abnormalities of gait and mobility: Secondary | ICD-10-CM | POA: Insufficient documentation

## 2015-06-06 NOTE — Therapy (Signed)
[facility] [address] [place], [place], [address] Phone: [phone]   Fax:  [phone]  Physical Therapy Treatment  Patient Details  Name: [name] MRN: [id number] Date of Birth: 1965/08/17 Referring Provider: Frankey Shown, MD  Encounter Date: 06/06/2015      PT End of Session - 06/06/15 1239    Visit Number 6   Number of Visits 17   Date for PT Re-Evaluation 07/08/15   Authorization Type Medicare G-Code & progress report every 10 visits   PT Start Time [id number]   PT Stop Time 1315   PT Time Calculation (min) 40 min   Equipment Utilized During Treatment Gait belt   Activity Tolerance Patient tolerated treatment well   Behavior During Therapy [facility] for tasks assessed/performed      Past Medical History  Diagnosis Date  . Anxiety   . Depression   . Hypertension   . HIV infection ([place]) 2000  . Asthma     HOSPITALIZED WITH EXCERBATION OF ASTHMA - AND BRONCHITIS AND THE FLU DEC 2013  . Shortness of breath     ALLERGIES ARE "ACTING UP"  . Diabetes mellitus     ON INSULIN AND ORAL MEDICATIONS  . GERD (gastroesophageal reflux disease)   . Neuropathy     NEUROPATHY HANDS AND FEET  . Arthritis     LEFT HIP  . Pain     SEVERE PAIN LEFT HIP - HX OF LEFT HIP PINNING JULY 2013    Past Surgical History  Procedure Laterality Date  . Hip pinning,cannulated  07/16/2011    Procedure: CANNULATED HIP PINNING;  Surgeon: Gearlean Alf, MD;  Location: [facility];  Service: Orthopedics;  Laterality: Left;  . Hip pinning,cannulated Left 04/09/2012    Procedure: CANNULATED HIP PINNING AND HARDWARE REVISION;  Surgeon: Gearlean Alf, MD;  Location: [facility];  Service: Orthopedics;  Laterality: Left;  . Hip arthroplasty Left 05/05/2014    Procedure: ARTHROPLASTY OPEN REDUCTION INTERNAL FIXATION LEFT HIP AND REMOVAL OF TWO CANNULATED SCREW;  Surgeon: Latanya Maudlin, MD;  Location: [facility];  Service: Orthopedics;  Laterality: Left;   . Cast application Left [id number]    Procedure: CAST APPLICATION (FIBERGLASS);  Surgeon: Latanya Maudlin, MD;  Location: [facility];  Service: Orthopedics;  Laterality: Left;  CLOSED REDUCTION OF LEFT COLLES FRACTURE WITH SHORT ARM CAST  . Hardware removal Left 05/05/2014    Procedure: HARDWARE REMOVAL LEFT HIP;  Surgeon: Latanya Maudlin, MD;  Location: [facility];  Service: Orthopedics;  Laterality: Left;  REMOVAL BIOMET 6.5-8.0 CANNULATED SCREW  . Peripheral vascular catheterization N/A 11/15/2014    Procedure: Abdominal Aortogram;  Surgeon: Conrad Cottondale, MD;  Location: [facility];  Service: Cardiovascular;  Laterality: N/A;  . Peripheral vascular catheterization  11/15/2014    Procedure: Lower Extremity Angiography;  Surgeon: Conrad Jane, MD;  Location: [facility];  Service: Cardiovascular;;  . Peripheral vascular catheterization Left 11/15/2014    Procedure: Peripheral Vascular Intervention;  Surgeon: Conrad Nulato, MD;  Location: [place] [place] [facility];  Service: Cardiovascular;  Laterality: Left;  popliteal artery stenting  . I&d extremity Left 11/16/2014    Procedure:  DEBRIDEMENT OF LEFT FOOT POSSIBLE APPLICATION OF INTEGRIA AND VAC ;  Surgeon: Irene Limbo, MD;  Location: [facility];  Service: Plastics;  Laterality: Left;  . Amputation Left 11/18/2014    Procedure: LEFT BELOW KNEE AMPUTATION;  Surgeon: Leandrew Koyanagi, MD;  Location: [facility];  Service: Orthopedics;  Laterality: Left;    There were no vitals filed for this visit.      Subjective Assessment - 06/06/15 1237    Subjective No new falls.    Pertinent History DM2, neuropathy, HIV, mood disorder, HTN, CKD (stage IV), asthma, arthritis, left hip fracture with ORIF 07/16/2011 and hardware removal with new pinning 05/05/2014   Limitations Lifting;Standing;Walking   Patient Stated Goals to use prosthesis to go in community including shopping, church, [place] meetings   Currently in Pain? Yes   Pain Score 7    Pain Location Back   Pain  Orientation Lower;Mid   Pain Descriptors / Indicators Aching;Sore   Pain Type Chronic pain   Pain Onset More than a month ago   Pain Frequency Intermittent   Aggravating Factors  increased activity, improper body mechanics/positioning   Pain Relieving Factors rest, cold, pain medication            [facility] Adult PT Treatment/Exercise - 06/06/15 1240    Transfers   Transfers Sit to Stand;Stand to Sit   Sit to Stand 5: Supervision;With upper extremity assist;With armrests;From chair/3-in-1;4: Min assist   Sit to Stand Details Verbal cues for technique;Verbal cues for safe use of DME/AE   Sit to Stand Details (indicate cue type and reason) min assist with cues on technique to stand from chair without armrests;cues on hand placement to stand from wheelchiar and mat table   Stand to Sit 5: Supervision;With upper extremity assist;With armrests;To chair/3-in-1   Stand to Sit Details (indicate cue type and reason) Verbal cues for safe use of DME/AE;Verbal cues for precautions/safety   Ambulation/Gait   Ambulation/Gait Yes   Ambulation/Gait Assistance 5: Supervision   Ambulation/Gait Assistance Details cues on posutre, to increase base of support with gait, to increase step length and for walker position with gait (pt tends to push walker too far out)   Ambulation Distance (Feet) 60 Feet  x2   Assistive device Rolling walker;Prosthesis   Gait Pattern Step-through pattern;Decreased stance time - left;Decreased step length - right;Decreased step length - left;Narrow base of support;Trunk flexed   Ambulation Surface Level;Indoor   Prosthetics   Prosthetic Care Comments  pt with leg length descrepancy: 3/4 inch short on prosthesis side. Pt to call Fritz Pickerel  (prosthetist) to have adjustements made.   Current prosthetic wear tolerance (days/week)  daily   Current prosthetic wear tolerance (#hours/day)  wearing all awake hours with drying every 5 hours and prn   Residual limb condition  blisters healed  per pt report, very little heat rash present that is clearing up   Education Provided Residual limb care;Proper wear schedule/adjustment;Proper weight-bearing schedule/adjustment   Person(s) Educated Patient   Education Method Explanation;Demonstration;Verbal cues   Education Method Verbalized understanding;Verbal cues required;Needs further instruction   Donning Prosthesis Supervision   Doffing Prosthesis Supervision     Treatment continued: Exercises Initiated basic back exercise packet: cues needed for ex form and hold times.  Posterior pelvic tilt 5 sec holds x 10 reps Single knee to chest stretch, 20 sec holds x 3 reps each leg Double knee to chest stretch, 20 sec hold x 3 reps  Lower trunk rotation left<>right, 10 sec hold x 5 each way          PT Short Term Goals - 05/24/15 1353    PT SHORT TERM GOAL #1   Title Patient tolerates wear of prosthesis >12hrs/day without skin issues. (Target Date: 06/10/2015)   Time 4   Period Weeks   Status  On-going   PT SHORT TERM GOAL #2   Title Patient properly donnes prosthesis and verbalizes proper adjustment of wear schedule. (Target Date: 06/10/2015)   Time 4   Period Weeks   Status On-going   PT SHORT TERM GOAL #3   Title Patient ambulates 11' with RW & prosthesis with supervision. (Target Date: 06/10/2015)   Time 4   Period Weeks   Status On-going   PT SHORT TERM GOAL #4   Title Patient negotiates ramps & curbs with RW & prosthesis with minA. (Target Date: 06/10/2015)   Time 4   Period Weeks   Status On-going   PT SHORT TERM GOAL #5   Title Patient performs standing balance activities with RW support reaching 10" all directions, to floor and manages clothes safely. (Target Date: 06/10/2015)   Time 4   Period Weeks   Status On-going           PT Long Term Goals - 05/24/15 1354    PT LONG TERM GOAL #1   Title Patient tolerates wear of prosthesis >90% of awake hours without skin issues or limb tenderness. (Target Date:  07/08/2015)   Time 8   Period Weeks   Status On-going   PT LONG TERM GOAL #2   Title Patient verbalizes & demonstrates understanding of prosthetic care to enable safe use of prosthesis.  (Target Date: 07/08/2015)   Time 8   Period Weeks   Status On-going   PT LONG TERM GOAL #3   Title Berg Balance > 24/56 to indicate lower fall risk.  (Target Date: 07/08/2015)   Time 8   Period Weeks   Status On-going   PT LONG TERM GOAL #4   Title Timed Up & Go <20 seconds modified independent safely.  (Target Date: 07/08/2015)   Time 8   Period Weeks   Status On-going   PT LONG TERM GOAL #5   Title Patient ambulates 250' with LRAD & prosthesis modified independent to enable safe community mobility.  (Target Date: 07/08/2015)   Time 8   Period Weeks   Status On-going   PT LONG TERM GOAL #6   Title Patient negotiates ramps & curbs with LRAD & prosthesis modified indpendent to enable safe community mobility.  (Target Date: 07/08/2015)   Time 8   Period Weeks   Status On-going            Plan - 06/06/15 1240    Clinical Impression Statement Continued to work on prosthetic management and gait. Prosthesis found to be shorter than pt's other leg. Pt to call prosthetist to have this adjusted. Also initiated basic back exercise packet to begin to address pt's back pain. No issues reported with this. Pt is making steady progress toward goals .   Rehab Potential Good   PT Frequency 2x / week   PT Duration 8 weeks   PT Treatment/Interventions ADLs/Self Care Home Management;DME Instruction;Gait training;Stair training;Functional mobility training;Therapeutic activities;Therapeutic exercise;Balance training;Neuromuscular re-education;Patient/family education;Prosthetic Training;Vestibular   PT Next Visit Plan Check STGs; complete basic back exercise packet;continue with gait and balance training with prosthesis.      Patient will benefit from skilled therapeutic intervention in order to improve the following  deficits and impairments:  Abnormal gait, Decreased activity tolerance, Decreased balance, Decreased endurance, Decreased knowledge of use of DME, Decreased mobility, Impaired flexibility, Decreased strength, Postural dysfunction, Prosthetic Dependency, Pain  Visit Diagnosis: Other abnormalities of gait and mobility  Unsteadiness on feet  Muscle weakness (generalized)  Pain in left  lower leg  Other symptoms and signs involving the musculoskeletal system     Problem List Patient Active Problem List   Diagnosis Date Noted  . Diastolic dysfunction 99991111  . Diabetic neuropathy ([place]) 11/10/2014  . Chronic kidney disease (CKD), stage IV (severe) ([place]) 11/10/2014  . Diabetic foot infection ([place]) 11/09/2014  . Fracture of distal femur ([place]) 07/08/2014  . Osteoporosis 07/08/2014  . Femur fracture, left ([place]) 07/08/2014  . Colles' fracture of left radius   . Fever   . ARF (acute renal failure) ([place]) 05/03/2014  . Left hip pain 05/03/2014  . Asthma 05/03/2014  . Hip pain   . CAP (community acquired pneumonia) 07/25/2012  . Perimenopausal symptoms 02/13/2012  . Hyponatremia 12/29/2011  . Asthma exacerbation attacks 12/28/2011  . Bronchitis 12/28/2011  . Repeated falls 07/30/2011  . Hip fracture, left ([place]) 07/16/2011  . Femoral neck fracture ([place]) 07/16/2011  . Tobacco use disorder 04/14/2010  . Dyslipidemia 12/23/2008  . ACUTE BRONCHITIS 11/29/2008  . DIARRHEA 08/30/2008  . FATIGUE 07/12/2008  . THRUSH 05/20/2008  . Human immunodeficiency virus (HIV) disease ([place]) 04/19/2008  . Diabetes mellitus type 2 with complications, uncontrolled ([place]) 04/19/2008  . Mood disorder ([place]) 04/19/2008  . Hereditary and idiopathic peripheral neuropathy 04/19/2008  . Essential hypertension 04/19/2008    Willow Ora, PTA, [facility] [address], [place], [place] [address] [phone] 06/07/2015, 10:52 AM   Name: [name] MRN: [id number] Date  of Birth: [birth year]

## 2015-06-08 ENCOUNTER — Ambulatory Visit (INDEPENDENT_AMBULATORY_CARE_PROVIDER_SITE_OTHER): Payer: Commercial Managed Care - HMO | Admitting: Infectious Diseases

## 2015-06-08 ENCOUNTER — Telehealth: Payer: Self-pay

## 2015-06-08 ENCOUNTER — Encounter: Payer: Self-pay | Admitting: Physical Therapy

## 2015-06-08 ENCOUNTER — Ambulatory Visit: Payer: Commercial Managed Care - HMO | Admitting: Physical Therapy

## 2015-06-08 ENCOUNTER — Encounter: Payer: Self-pay | Admitting: Infectious Diseases

## 2015-06-08 VITALS — BP 94/64 | HR 72 | Temp 97.7°F

## 2015-06-08 DIAGNOSIS — R2681 Unsteadiness on feet: Secondary | ICD-10-CM

## 2015-06-08 DIAGNOSIS — B372 Candidiasis of skin and nail: Secondary | ICD-10-CM | POA: Diagnosis not present

## 2015-06-08 DIAGNOSIS — M79662 Pain in left lower leg: Secondary | ICD-10-CM | POA: Diagnosis not present

## 2015-06-08 DIAGNOSIS — M6281 Muscle weakness (generalized): Secondary | ICD-10-CM

## 2015-06-08 DIAGNOSIS — B20 Human immunodeficiency virus [HIV] disease: Secondary | ICD-10-CM | POA: Diagnosis not present

## 2015-06-08 DIAGNOSIS — R197 Diarrhea, unspecified: Secondary | ICD-10-CM

## 2015-06-08 DIAGNOSIS — Z79899 Other long term (current) drug therapy: Secondary | ICD-10-CM

## 2015-06-08 DIAGNOSIS — R29898 Other symptoms and signs involving the musculoskeletal system: Secondary | ICD-10-CM

## 2015-06-08 DIAGNOSIS — F17200 Nicotine dependence, unspecified, uncomplicated: Secondary | ICD-10-CM

## 2015-06-08 DIAGNOSIS — R2689 Other abnormalities of gait and mobility: Secondary | ICD-10-CM | POA: Diagnosis not present

## 2015-06-08 DIAGNOSIS — Z113 Encounter for screening for infections with a predominantly sexual mode of transmission: Secondary | ICD-10-CM

## 2015-06-08 MED ORDER — DIPHENOXYLATE-ATROPINE 2.5-0.025 MG PO TABS: 1.0000 | ORAL_TABLET | Freq: Four times a day (QID) | ORAL | Status: DC | PRN

## 2015-06-08 NOTE — Assessment & Plan Note (Signed)
Has quit, will mark as resolved.

## 2015-06-08 NOTE — Telephone Encounter (Signed)
Called patient's prescription for Lomotil 25.-0.025mg  to Rob at [facility] per Dr. Algis Downs request. Rodman Key, LPN
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
## 2015-06-08 NOTE — Assessment & Plan Note (Signed)
She is doing well on genvoya.  Will refill her lomotil Will see her back in 6 months

## 2015-06-08 NOTE — Assessment & Plan Note (Signed)
Will refill lomotil.

## 2015-06-09 ENCOUNTER — Other Ambulatory Visit: Payer: Commercial Managed Care - HMO

## 2015-06-09 NOTE — Therapy (Signed)
[facility] [address] [place], [place], [address] Phone: [phone]   Fax:  [phone]  Physical Therapy Treatment  Patient Details  Name: [name] MRN: [id number] Date of Birth: Aug 04, 1965 Referring Provider: Frankey Shown, MD  Encounter Date: 06/08/2015      PT End of Session - 06/08/15 1407    Visit Number 7   Number of Visits 17   Date for PT Re-Evaluation 07/08/15   Authorization Type Medicare G-Code & progress report every 10 visits   PT Start Time 1403   PT Stop Time 1445   PT Time Calculation (min) 42 min   Equipment Utilized During Treatment Gait belt   Activity Tolerance Patient tolerated treatment well   Behavior During Therapy [facility] for tasks assessed/performed      Past Medical History  Diagnosis Date  . Anxiety   . Depression   . Hypertension   . HIV infection ([place]) 2000  . Asthma     HOSPITALIZED WITH EXCERBATION OF ASTHMA - AND BRONCHITIS AND THE FLU DEC 2013  . Shortness of breath     ALLERGIES ARE "ACTING UP"  . Diabetes mellitus     ON INSULIN AND ORAL MEDICATIONS  . GERD (gastroesophageal reflux disease)   . Neuropathy     NEUROPATHY HANDS AND FEET  . Arthritis     LEFT HIP  . Pain     SEVERE PAIN LEFT HIP - HX OF LEFT HIP PINNING JULY 2013    Past Surgical History  Procedure Laterality Date  . Hip pinning,cannulated  07/16/2011    Procedure: CANNULATED HIP PINNING;  Surgeon: Gearlean Alf, MD;  [facility];  Service: Orthopedics;  Laterality: Left;  . Hip pinning,cannulated Left 04/09/2012    Procedure: CANNULATED HIP PINNING AND HARDWARE REVISION;  Surgeon: Gearlean Alf, MD;  [facility];  Service: Orthopedics;  Laterality: Left;  . Hip arthroplasty Left 05/05/2014    Procedure: ARTHROPLASTY OPEN REDUCTION INTERNAL FIXATION LEFT HIP AND REMOVAL OF TWO CANNULATED SCREW;  Surgeon: Latanya Maudlin, MD;  [facility];  Service: Orthopedics;  Laterality: Left;   . Cast application Left 01/03/2438    Procedure: CAST APPLICATION (FIBERGLASS);  Surgeon: Latanya Maudlin, MD;  [facility];  Service: Orthopedics;  Laterality: Left;  CLOSED REDUCTION OF LEFT COLLES FRACTURE WITH SHORT ARM CAST  . Hardware removal Left 05/05/2014    Procedure: HARDWARE REMOVAL LEFT HIP;  Surgeon: Latanya Maudlin, MD;  [facility];  Service: Orthopedics;  Laterality: Left;  REMOVAL BIOMET 6.5-8.0 CANNULATED SCREW  . Peripheral vascular catheterization N/A 11/15/2014    Procedure: Abdominal Aortogram;  Surgeon: Conrad T[place], MD;  Location: [place] [facility];  Service: Cardiovascular;  Laterality: N/A;  . Peripheral vascular catheterization  11/15/2014    Procedure: Lower Extremity Angiography;  Surgeon: Conrad [place], MD;  Location: [place] [facility];  Service: Cardiovascular;;  . Peripheral vascular catheterization Left 11/15/2014    Procedure: Peripheral Vascular Intervention;  Surgeon: Conrad Blauvelt, MD;  Location: [place] [facility];  Service: Cardiovascular;  Laterality: Left;  popliteal artery stenting  . I&d extremity Left 11/16/2014    Procedure:  DEBRIDEMENT OF LEFT FOOT POSSIBLE APPLICATION OF INTEGRIA AND VAC ;  Surgeon: Irene Limbo, MD;  Location: [facility];  Service: Plastics;  Laterality: Left;  . Amputation Left 11/18/2014    Procedure: LEFT BELOW KNEE AMPUTATION;  Surgeon: Leandrew Koyanagi, MD;  Location: [facility];  Service: Orthopedics;  Laterality: Left;    There were no vitals filed for this visit.      Subjective Assessment - 06/08/15 1406    Subjective No new complaints. Did see Fritz Pickerel and had prosthesis adjusted by half an inch on prosthesis side. Reports it feels better with walking now.   Patient is accompained by: Family member   Pertinent History DM2, neuropathy, HIV, mood disorder, HTN, CKD (stage IV), asthma, arthritis, left hip fracture with ORIF 07/16/2011 and hardware removal with new pinning 05/05/2014   Limitations Lifting;Standing;Walking    Patient Stated Goals to use prosthesis to go in community including shopping, church, [place] meetings   Currently in Pain? No/denies   Pain Score 0-No pain               [facility] Adult PT Treatment/Exercise - 06/08/15 1407    Transfers   Transfers Sit to Stand;Stand to Sit   Sit to Stand 5: Supervision;With upper extremity assist;From chair/3-in-1;With armrests   Stand to Sit 5: Supervision;With upper extremity assist;To chair/3-in-1;With armrests   Ambulation/Gait   Ambulation/Gait Yes   Ambulation/Gait Assistance 5: Supervision   Ambulation/Gait Assistance Details cues on posture, for equal step length and for increased step length   Ambulation Distance (Feet) 220 Feet   Assistive device Rolling walker;Prosthesis   Gait Pattern Step-through pattern;Decreased step length - right;Decreased stance time - left;Decreased step length - left;Decreased stride length;Trunk flexed;Narrow base of support   Ambulation Surface Level;Indoor   Stairs Yes   Stairs Assistance 4: Min guard   Stairs Assistance Details (indicate cue type and reason) minimal cues on sequencing and to advance hands along rails   Stair Management Technique Two rails;Step to pattern;Forwards   Number of Stairs 4   Height of Stairs 6   Ramp Other (comment)  min guard assist   Curb Other (comment)  min guard assist   Prosthetics   Current prosthetic wear tolerance (days/week)  daily   Current prosthetic wear tolerance (#hours/day)  wearing all awake hours with drying every 5 hours and prn   Residual limb condition  skin checked. rash present. ? heat rash vs allergy on lower limb, patella and upper thigh. Pt using antipersperant on lower limb in moring and when drying. Not using baby/mineral oil. Baby oil applied to patella and above to decrease friction from liner.                     PT Short Term Goals - 06/08/15 1407    PT SHORT TERM GOAL #1   Title Patient tolerates wear of prosthesis >12hrs/day without skin  issues. (Target Date: 06/10/2015)   Time --   Period --   Status Partially Met   PT SHORT TERM GOAL #2   Title Patient properly donnes prosthesis and verbalizes proper adjustment of wear schedule. (Target Date: 06/10/2015)   Time --   Period --   Status Achieved   PT SHORT TERM GOAL #3   Title Patient ambulates 100' with RW & prosthesis with supervision. (Target Date: 06/10/2015)   Time --   Period --   Status Achieved   PT SHORT TERM GOAL #4   Title Patient negotiates ramps & curbs with RW & prosthesis with minA. (Target Date: 06/10/2015)   Time --   Period --   Status Achieved   PT SHORT TERM GOAL #5   Title Patient performs standing balance activities with RW support reaching 10" all directions, to floor and manages clothes safely. (Target Date:  06/10/2015)   Time 4   Period Weeks   Status On-going           PT Long Term Goals - 05/24/15 1354    PT LONG TERM GOAL #1   Title Patient tolerates wear of prosthesis >90% of awake hours without skin issues or limb tenderness. (Target Date: 07/08/2015)   Time 8   Period Weeks   Status On-going   PT LONG TERM GOAL #2   Title Patient verbalizes & demonstrates understanding of prosthetic care to enable safe use of prosthesis.  (Target Date: 07/08/2015)   Time 8   Period Weeks   Status On-going   PT LONG TERM GOAL #3   Title Berg Balance > 24/56 to indicate lower fall risk.  (Target Date: 07/08/2015)   Time 8   Period Weeks   Status On-going   PT LONG TERM GOAL #4   Title Timed Up & Go <20 seconds modified independent safely.  (Target Date: 07/08/2015)   Time 8   Period Weeks   Status On-going   PT LONG TERM GOAL #5   Title Patient ambulates 250' with LRAD & prosthesis modified independent to enable safe community mobility.  (Target Date: 07/08/2015)   Time 8   Period Weeks   Status On-going   PT LONG TERM GOAL #6   Title Patient negotiates ramps & curbs with LRAD & prosthesis modified indpendent to enable safe community mobility.   (Target Date: 07/08/2015)   Time 8   Period Weeks   Status On-going           Plan - 06/08/15 1407    Clinical Impression Statement Pt has met 3 of 5 STGs today. She is progressing to unmet goals as well. STG number 1 not met due to rash on limb. Will plan to check 2 unmet STGs at next session.   Rehab Potential Good   PT Frequency 2x / week   PT Duration 8 weeks   PT Treatment/Interventions ADLs/Self Care Home Management;DME Instruction;Gait training;Stair training;Functional mobility training;Therapeutic activities;Therapeutic exercise;Balance training;Neuromuscular re-education;Patient/family education;Prosthetic Training;Vestibular   PT Next Visit Plan Check remaining 2 unmet STGs; complete basic back exercise packet;continue with gait and balance training with prosthesis.      Patient will benefit from skilled therapeutic intervention in order to improve the following deficits and impairments:  Abnormal gait, Decreased activity tolerance, Decreased balance, Decreased endurance, Decreased knowledge of use of DME, Decreased mobility, Impaired flexibility, Decreased strength, Postural dysfunction, Prosthetic Dependency, Pain  Visit Diagnosis: Other abnormalities of gait and mobility  Unsteadiness on feet  Muscle weakness (generalized)  Pain in left lower leg  Other symptoms and signs involving the musculoskeletal system     Problem List Patient Active Problem List   Diagnosis Date Noted  . Diastolic dysfunction 50/27/7412  . Diabetic neuropathy ([place]) 11/10/2014  . Chronic kidney disease (CKD), stage IV (severe) ([place]) 11/10/2014  . Fracture of distal femur ([place]) 07/08/2014  . Osteoporosis 07/08/2014  . Femur fracture, left ([place]) 07/08/2014  . Colles' fracture of left radius   . Left hip pain 05/03/2014  . Hip pain   . Perimenopausal symptoms 02/13/2012  . Hyponatremia 12/29/2011  . Asthma exacerbation attacks 12/28/2011  . Bronchitis 12/28/2011  . Repeated falls  07/30/2011  . Hip fracture, left ([place]) 07/16/2011  . Femoral neck fracture ([place]) 07/16/2011  . Dyslipidemia 12/23/2008  . DIARRHEA 08/30/2008  . FATIGUE 07/12/2008  . THRUSH 05/20/2008  . Human immunodeficiency virus (HIV) disease ([place]) 04/19/2008  .  Diabetes mellitus type 2 with complications, uncontrolled ([place]) 04/19/2008  . Mood disorder ([place]) 04/19/2008  . Hereditary and idiopathic peripheral neuropathy 04/19/2008  . Essential hypertension 04/19/2008    Willow Ora, PTA, [facility] [address], [place], [place] [address] [phone] 06/09/2015, 4:06 PM   Name: THEADORA NOYES MRN: [id number] Date of Birth: 10/18/1965

## 2015-06-13 ENCOUNTER — Ambulatory Visit: Payer: Commercial Managed Care - HMO | Admitting: Physical Therapy

## 2015-06-13 ENCOUNTER — Encounter: Payer: Self-pay | Admitting: Endocrinology

## 2015-06-13 ENCOUNTER — Ambulatory Visit (INDEPENDENT_AMBULATORY_CARE_PROVIDER_SITE_OTHER): Payer: Commercial Managed Care - HMO | Admitting: Endocrinology

## 2015-06-13 VITALS — BP 71/43 | HR 69 | Wt 266.0 lb

## 2015-06-13 DIAGNOSIS — R2681 Unsteadiness on feet: Secondary | ICD-10-CM | POA: Diagnosis not present

## 2015-06-13 DIAGNOSIS — R2689 Other abnormalities of gait and mobility: Secondary | ICD-10-CM | POA: Diagnosis not present

## 2015-06-13 DIAGNOSIS — Z794 Long term (current) use of insulin: Secondary | ICD-10-CM

## 2015-06-13 DIAGNOSIS — M6281 Muscle weakness (generalized): Secondary | ICD-10-CM | POA: Diagnosis not present

## 2015-06-13 DIAGNOSIS — E1165 Type 2 diabetes mellitus with hyperglycemia: Secondary | ICD-10-CM

## 2015-06-13 DIAGNOSIS — M79662 Pain in left lower leg: Secondary | ICD-10-CM | POA: Diagnosis not present

## 2015-06-13 DIAGNOSIS — R29898 Other symptoms and signs involving the musculoskeletal system: Secondary | ICD-10-CM | POA: Diagnosis not present

## 2015-06-13 NOTE — Patient Instructions (Signed)
Check blood sugars on waking up 4  times a week  Also check blood sugars about 2 hours after a meal and do this after different meals by rotation  Recommended blood sugar levels on waking up is 90-130 and about 2 hours after meal is 130-160  Please bring your blood sugar monitor to each visit, thank you  Same insulin doses, take 30 min before meals, may adjust upto 10 units for size of meals  Low fat meals

## 2015-06-13 NOTE — Progress Notes (Signed)
Patient ID: [name], female   DOB: 11/22/1965, 50 y.o.   MRN: [id number]           Reason for Appointment: Follow-up  for Type 2 Diabetes   History of Present Illness:          Date of diagnosis of type 2 diabetes mellitus: 2000       Background history:     The patient was apparently diagnosed incidentally with her diabetes 16 years ago 16 years ago, was asymptomatic. She took metformin and possibly other oral hypoglycemic drugs but subsequently went on insulin in 2005 She was taking various insulin regimens in the past  She was being treated by an endocrinologist some time ago and he switched her To Toujeo and also tried her on Byetta.  She thinks that she had better blood sugars with Byetta but she did not follow-up   Recent history:   INSULIN regimen is:  Toujeo 70 units qd  Humulin R U-500 60 units before breakfast, 40 units before lunch and 50-60 before supper Non-insulin hypoglycemic drugs the patient is taking are: Victoza 1.8    Meal times: Breakfast 11 AM, dinner 8-9 PM  Novolog was switched to U-500 insulin in 3/17  Evening Toujeo was stopped on her last visit Victoza was restarted on the last visit  Her A1c is 7.9 which is better than what she has had over the last year  Current management, blood sugar patterns and problems identified:  She is checking her blood sugar a little more often but mostly in the evenings before and after supper   She does have significant variability in her blood sugars at all times  With only a couple of readings fasting difficult to know if she has good control although that sugar this morning was better than usual at 87  Has had a low blood sugar only once probably from overestimating her insulin  She will still take 30 units of her insulin even if she is not eating a meal  She is taking her U-500 consistently 3 times a day but she is frequently forgetting to take it before eating in the evening and may take it right after  or an hour after eating  Postprandial readings are sporadically high especially late at night.    She thinks she is having less food cravings late at night with starting Victoza    Side effects from medications have been: None  Compliance with the medical regimen:  fair Hypoglycemia:  rarely low if not eating , none for 2 months  Glucose monitoring:  done  times a day         Glucometer: One Touch Verio.      Blood Glucose readings by download  Mean values apply above for all meters except median for One Touch  PRE-MEAL Fasting Lunch Dinner Bedtime Overall  Glucose range: 87--225 127, 190  95-357  56-363    Mean/median:     178+/-84     Self-care: The diet that the patient has been following is: tries to limit Fat intake .        Typical meal intake: Breakfast is  yogurt or oatmeal.  Lunch is variable, has mixed meal at dinnertime, usually avoiding fried food.   Has variable amounts of snacks.  She is avoiding drinks with sugar                Dietician visit, most recent: At [facility]  Exercise:  none  Weight history:  Wt Readings from Last 3 Encounters:  06/13/15 266 lb (120.657 kg)  11/22/14 248 lb 1.6 oz (112.537 kg)  11/09/14 243 lb (110.224 kg)    Glycemic control: 11 in 8/16   Lab Results  Component Value Date   HGBA1C 7.9 06/14/2015   HGBA1C 10.7* 11/10/2014   HGBA1C 8.4* 07/08/2014   Lab Results  Component Value Date   MICROALBUR <0.7 03/16/2015   LDLCALC 62 05/20/2015   CREATININE 1.79* 05/20/2015          Medication List       This list is accurate as of: 06/13/15 11:59 PM.  Always use your most recent med list.               ARIPiprazole 10 MG tablet  Commonly known as:  ABILIFY  Take 10 mg by mouth daily.     aspirin 325 MG EC tablet  Take 1 tablet (325 mg total) by mouth daily.     budesonide-formoterol 80-4.5 MCG/ACT inhaler  Commonly known as:  SYMBICORT  Inhale 2 puffs into the lungs 2 (two) times daily.      clonazePAM 0.5 MG tablet  Commonly known as:  KLONOPIN  Take 0.5 tablets (0.25 mg total) by mouth 3 (three) times daily as needed (anxiety).     cyclobenzaprine 5 MG tablet  Commonly known as:  FLEXERIL  Take 2 tablets (10 mg total) by mouth 3 (three) times daily as needed for muscle spasms.     diphenoxylate-atropine 2.5-0.025 MG tablet  Commonly known as:  LOMOTIL  Take 1 tablet by mouth 4 (four) times daily as needed for diarrhea or loose stools.     elvitegravir-cobicistat-emtricitabine-tenofovir 150-150-200-10 MG Tabs tablet  Commonly known as:  GENVOYA  Take 1 tablet by mouth daily with breakfast.     escitalopram 20 MG tablet  Commonly known as:  LEXAPRO  Take 20 mg by mouth daily.     eszopiclone 2 MG Tabs tablet  Commonly known as:  LUNESTA  Take 3 mg by mouth at bedtime as needed for sleep. Take immediately before bedtime     feeding supplement (GLUCERNA SHAKE) Liqd  Take 237 mLs by mouth 3 (three) times daily between meals.     fenofibrate 145 MG tablet  Commonly known as:  TRICOR  Take 1 tablet (145 mg total) by mouth daily.     furosemide 40 MG tablet  Commonly known as:  LASIX  Take 40 mg by mouth daily.     HUMULIN R U-500 KWIKPEN 500 UNIT/ML kwikpen  Generic drug:  insulin regular human CONCENTRATED  ADMINISTER DOSES 30 MINUTES BEFORE EACH MEAL. USE 60 UNITS BEFORE BREAKFAST, 50 UNITS BEFORE LUNCH AND 35 UNITS BEFORE DINNER     HYDROcodone-acetaminophen 10-325 MG tablet  Commonly known as:  NORCO  Take 1 tablet by mouth every 6 (six) hours as needed for moderate pain or severe pain.     HYDROmorphone 2 MG tablet  Commonly known as:  DILAUDID  Take 1 tablet (2 mg total) by mouth every 6 (six) hours as needed for severe pain.     Insulin Pen Needle 32G X 5 MM Misc  Commonly known as:  NOVOTWIST  Use 2 per day to inject Toujeo and Victoza     methylphenidate 20 MG tablet  Commonly known as:  RITALIN  Take 20 mg by mouth daily.     multivitamin with  minerals tablet  Take 1 tablet by  mouth daily.     NOVOLOG 100 UNIT/ML injection  Generic drug:  insulin aspart  ADMINISTER 15 TO 20 UNITS UNDER THE SKIN BEFORE MEALS     pantoprazole 40 MG tablet  Commonly known as:  PROTONIX  Take 40 mg by mouth daily.     potassium chloride SA 20 MEQ tablet  Commonly known as:  K-DUR,KLOR-CON  Take 20 mEq by mouth daily.     pregabalin 225 MG capsule  Commonly known as:  LYRICA  Take 225 mg by mouth 2 (two) times daily.     rosuvastatin 20 MG tablet  Commonly known as:  CRESTOR  Take 20 mg by mouth daily. Reported on 05/02/2015     TOUJEO SOLOSTAR 300 UNIT/ML Sopn  Generic drug:  Insulin Glargine  INJECT 70 UNITS INTO THE SKIN TWICE DAILY     Vitamin D3 5000 units Tabs  Take 5,000 Units by mouth daily.     vitamin E 1000 UNIT capsule  Generic drug:  vitamin E  Take 1,000 Units by mouth daily.        Allergies:  Allergies  Allergen Reactions  . Cortizone-10 [Hydrocortisone] Rash    Per patient, given local injection at knee and developed rash at local site.     Past Medical History  Diagnosis Date  . Anxiety   . Depression   . Hypertension   . HIV infection ([place]) 2000  . Asthma     HOSPITALIZED WITH EXCERBATION OF ASTHMA - AND BRONCHITIS AND THE FLU DEC 2013  . Shortness of breath     ALLERGIES ARE "ACTING UP"  . Diabetes mellitus     ON INSULIN AND ORAL MEDICATIONS  . GERD (gastroesophageal reflux disease)   . Neuropathy     NEUROPATHY HANDS AND FEET  . Arthritis     LEFT HIP  . Pain     SEVERE PAIN LEFT HIP - HX OF LEFT HIP PINNING JULY 2013    Past Surgical History  Procedure Laterality Date  . Hip pinning,cannulated  07/16/2011    Procedure: CANNULATED HIP PINNING;  Surgeon: Gearlean Alf, MD;  [facility];  Service: Orthopedics;  Laterality: Left;  . Hip pinning,cannulated Left 04/09/2012    Procedure: CANNULATED HIP PINNING AND HARDWARE REVISION;  Surgeon: Gearlean Alf, MD;  [facility];   Service: Orthopedics;  Laterality: Left;  . Hip arthroplasty Left 05/05/2014    Procedure: ARTHROPLASTY OPEN REDUCTION INTERNAL FIXATION LEFT HIP AND REMOVAL OF TWO CANNULATED SCREW;  Surgeon: Latanya Maudlin, MD;  [facility];  Service: Orthopedics;  Laterality: Left;  . Cast application Left [id number]    Procedure: CAST APPLICATION (FIBERGLASS);  Surgeon: Latanya Maudlin, MD;  [facility];  Service: Orthopedics;  Laterality: Left;  CLOSED REDUCTION OF LEFT COLLES FRACTURE WITH SHORT ARM CAST  . Hardware removal Left 05/05/2014    Procedure: HARDWARE REMOVAL LEFT HIP;  Surgeon: Latanya Maudlin, MD;  [facility];  Service: Orthopedics;  Laterality: Left;  REMOVAL BIOMET 6.5-8.0 CANNULATED SCREW  . Peripheral vascular catheterization N/A 11/15/2014    Procedure: Abdominal Aortogram;  Surgeon: Conrad Gratton, MD;  Location: [facility];  Service: Cardiovascular;  Laterality: N/A;  . Peripheral vascular catheterization  11/15/2014    Procedure: Lower Extremity Angiography;  Surgeon: Conrad Melwood, MD;  Location: [facility] [place] [facility];  Service: Cardiovascular;;  . Peripheral vascular catheterization Left 11/15/2014    Procedure: Peripheral Vascular Intervention;  Surgeon: Conrad [place]in[place], MD;  Location: [facility];  Service: Cardiovascular;  Laterality: Left;  popliteal artery stenting  . I&d extremity Left 11/16/2014    Procedure:  DEBRIDEMENT OF LEFT FOOT POSSIBLE APPLICATION OF INTEGRIA AND VAC ;  Surgeon: Irene Limbo, MD;  Location: [facility];  Service: Plastics;  Laterality: Left;  . Amputation Left 11/18/2014    Procedure: LEFT BELOW KNEE AMPUTATION;  Surgeon: Leandrew Koyanagi, MD;  Location: [facility];  Service: Orthopedics;  Laterality: Left;    Family History  Problem Relation Age of Onset  . Diabetes Mother   . Hypertension Mother   . Vision loss Mother   . Heart disease Mother   . Hypertension Father   . Thyroid disease Neg Hx     Social History:  reports that she has been  smoking E-cigarettes.  She has a 17.5 pack-year smoking history. She has never used smokeless tobacco. She reports that she does not drink alcohol or use illicit drugs.    Review of Systems    Lipid history: Has been treated with  Fenofibrate and Crestor by PCP     Lab Results  Component Value Date   CHOL 135 05/20/2015   HDL 44* 05/20/2015   LDLCALC 62 05/20/2015   TRIG 143 05/20/2015   CHOLHDL 3.1 05/20/2015           Physical Examination:  BP 71/43 mmHg  Pulse 69  Wt 266 lb (120.657 kg)  LMP 11/16/2010      ASSESSMENT:  Diabetes type 2, uncontrolled    See history of present illness for detailed discussion of current diabetes management, blood sugar patterns and problems identified  Her A1c is 7.9 which shows significant improvement  She has  better blood sugar control with adding U-500 insulin to her basal insulin However she has difficulty remembering to take it before meals and this may affect her readings after supper Her diet is again very inconsistent and this causes fluctuation in her blood sugars Blood sugars appear to be somewhat better in the last week with improved compliance Even with stopping her Toujeo in the evening her morning readings are not consistently high and as low as 87 today some: Fasting readings are being checked only occasionally  Other medical problems include HIV, edema, blood pressure, depression followed by PCP  PLAN:   She will try to check blood sugars consistently at various times as discussed including at least 3 times a week fasting and more readings 2 hours after eating Discussed blood sugar targets at various times She needs to take her insulin more consistently before eating especially in the evening Avoid high fat meals and snacks, may benefit from consultation with dietitian and this was ordered No change in basic insulin doses as yet, she does need to reduce the dose by 5-10 units if eating a lower carbohydrate meals but  also go up 10 units of planning to go out or eating larger meal in the evening Consider continuous glucose monitoring if still having significant blood sugar fluctuation or persistently high A1c    Patient Instructions  Check blood sugars on waking up 4  times a week  Also check blood sugars about 2 hours after a meal and do this after different meals by rotation  Recommended blood sugar levels on waking up is 90-130 and about 2 hours after meal is 130-160  Please bring your blood sugar monitor to each visit, thank you  Same insulin doses, take 30 min before meals, may adjust upto  10 units for size of meals  Low fat meals      Counseling time on subjects discussed above is over 50% of today's 25 minute visit   [name] 06/14/2015, 12:25 PM   Note: This office note was prepared with Estate agent. Any transcriptional errors that result from this process are unintentional.

## 2015-06-13 NOTE — Therapy (Signed)
[facility] [address] [place], [place], [address] Phone: [phone]   Fax:  [phone]  Physical Therapy Treatment  Patient Details  Name: [name] MRN: [id number] Date of Birth: September 25, 1965 Referring Provider: Frankey Shown, MD  Encounter Date: 06/13/2015      PT End of Session - 06/13/15 1654    Visit Number 8   Number of Visits 17   Date for PT Re-Evaluation 07/08/15   Authorization Type Medicare G-Code & progress report every 10 visits   PT Start Time 1232   PT Stop Time 1320   PT Time Calculation (min) 48 min   Equipment Utilized During Treatment Gait belt   Activity Tolerance Patient tolerated treatment well   Behavior During Therapy [facility] for tasks assessed/performed      Past Medical History  Diagnosis Date  . Anxiety   . Depression   . Hypertension   . HIV infection ([place]) 2000  . Asthma     HOSPITALIZED WITH EXCERBATION OF ASTHMA - AND BRONCHITIS AND THE FLU DEC 2013  . Shortness of breath     ALLERGIES ARE "ACTING UP"  . Diabetes mellitus     ON INSULIN AND ORAL MEDICATIONS  . GERD (gastroesophageal reflux disease)   . Neuropathy     NEUROPATHY HANDS AND FEET  . Arthritis     LEFT HIP  . Pain     SEVERE PAIN LEFT HIP - HX OF LEFT HIP PINNING JULY 2013    Past Surgical History  Procedure Laterality Date  . Hip pinning,cannulated  07/16/2011    Procedure: CANNULATED HIP PINNING;  Surgeon: Gearlean Alf, MD;  [facility];  Service: Orthopedics;  Laterality: Left;  . Hip pinning,cannulated Left 04/09/2012    Procedure: CANNULATED HIP PINNING AND HARDWARE REVISION;  Surgeon: Gearlean Alf, MD;  [facility];  Service: Orthopedics;  Laterality: Left;  . Hip arthroplasty Left 05/05/2014    Procedure: ARTHROPLASTY OPEN REDUCTION INTERNAL FIXATION LEFT HIP AND REMOVAL OF TWO CANNULATED SCREW;  Surgeon: Latanya Maudlin, MD;  [facility];  Service: Orthopedics;  Laterality: Left;   . Cast application Left 02/06/4980    Procedure: CAST APPLICATION (FIBERGLASS);  Surgeon: Latanya Maudlin, MD;  [facility];  Service: Orthopedics;  Laterality: Left;  CLOSED REDUCTION OF LEFT COLLES FRACTURE WITH SHORT ARM CAST  . Hardware removal Left 05/05/2014    Procedure: HARDWARE REMOVAL LEFT HIP;  Surgeon: Latanya Maudlin, MD;  [facility];  Service: Orthopedics;  Laterality: Left;  REMOVAL BIOMET 6.5-8.0 CANNULATED SCREW  . Peripheral vascular catheterization N/A 11/15/2014    Procedure: Abdominal Aortogram;  Surgeon: Conrad Obion, MD;  Location: [facility];  Service: Cardiovascular;  Laterality: N/A;  . Peripheral vascular catheterization  11/15/2014    Procedure: Lower Extremity Angiography;  Surgeon: Conrad Venango, MD;  Location: [facility];  Service: Cardiovascular;;  . Peripheral vascular catheterization Left 11/15/2014    Procedure: Peripheral Vascular Intervention;  Surgeon: Conrad Dunbar, MD;  Location: [place] [facility];  Service: Cardiovascular;  Laterality: Left;  popliteal artery stenting  . I&d extremity Left 11/16/2014    Procedure:  DEBRIDEMENT OF LEFT FOOT POSSIBLE APPLICATION OF INTEGRIA AND VAC ;  Surgeon: Irene Limbo, MD;  Location: [facility];  Service: Plastics;  Laterality: Left;  . Amputation Left 11/18/2014    Procedure: LEFT BELOW KNEE AMPUTATION;  Surgeon: Leandrew Koyanagi, MD;  Location: [facility];  Service: Orthopedics;  Laterality: Left;    There were no vitals filed for this visit.      Subjective Assessment - 06/13/15 1234    Subjective Low back pain is better than before amputation LLE feels okay. Pt is wearing leg all day and drying prn. No falls or skin issues since last visit   Patient is accompained by: Family member   Pertinent History DM2, neuropathy, HIV, mood disorder, HTN, CKD (stage IV), asthma, arthritis, left hip fracture with ORIF 07/16/2011 and hardware removal with new pinning 05/05/2014   Limitations Lifting;Standing;Walking    Patient Stated Goals to use prosthesis to go in community including shopping, church, [place] meetings   Currently in Pain? No/denies            [facility] PT Assessment - 06/13/15 1230    Balance   Balance Assessed Yes   Dynamic Standing Balance   Dynamic Standing - Balance Support Left upper extremity supported  RW support   Dynamic Standing - Level of Assistance 6: Modified independent (Device/Increase time)   Dynamic Standing - Balance Activities Reaching for objects   Dynamic Standing - Comments reaches forward >12", across midline >10", laterally to right 6", to floor                      [facility] Adult PT Treatment/Exercise - 06/13/15 1230    Transfers   Transfers Sit to Stand;Stand to Sit   Sit to Stand 5: Supervision;With upper extremity assist;From chair/3-in-1;With armrests   Sit to Stand Details Verbal cues for sequencing;Verbal cues for technique;Verbal cues for precautions/safety;Tactile cues for sequencing;Tactile cues for weight shifting   Sit to Stand Details (indicate cue type and reason) Onto 24" stool, verbal cues for bowing and increasing weightshift anterior, pusshing through legs   Stand to Sit 5: Supervision;With upper extremity assist;To chair/3-in-1;With armrests   Stand to Sit Details onto 24 in stool verbal cues for bowing when coming to sitting    Ambulation/Gait   Ambulation/Gait Yes   Ambulation/Gait Assistance 5: Supervision   Ambulation/Gait Assistance Details cues for step width, used visual cuing of walker to keep feet in line with buttons on walker   Ambulation Distance (Feet) 130 Feet  130' x1, 40' x 2   Assistive device Rolling walker;Prosthesis   Gait Pattern Step-through pattern;Decreased step length - right;Decreased stance time - left;Decreased step length - left;Decreased stride length;Trunk flexed;Narrow base of support   Ambulation Surface Level;Indoor   Neuro Re-ed    Neuro Re-ed Details  Step ups on 4" step with cues for  prosthetic sequencing and managment    Prosthetics   Current prosthetic wear tolerance (days/week)  daily   Current prosthetic wear tolerance (#hours/day)  wearing all awake hours with drying every 5 hours and prn   Residual limb condition  skin checked. rash present. ? heat rash vs allergy on lower limb, patella and upper thigh. Pt using antipersperant on lower limb in moring and when drying. Not using baby/mineral oil. Baby oil applied to patella and above to decrease friction from liner.                         PT Education - 06/13/15 1238    Education provided Yes   Education Details Education on walker height, increasing activity level in the home   Person(s) Educated Patient   Methods Explanation   Comprehension Verbalized understanding          PT Short Term  Goals - 06/13/15 1654    PT SHORT TERM GOAL #1   Title Patient tolerates wear of prosthesis >12hrs/day without skin issues. (Target Date: 06/10/2015)   Status Achieved   PT SHORT TERM GOAL #2   Title Patient properly donnes prosthesis and verbalizes proper adjustment of wear schedule. (Target Date: 06/10/2015)   Status Achieved   PT SHORT TERM GOAL #3   Title Patient ambulates 100' with RW & prosthesis with supervision. (Target Date: 06/10/2015)   Status Achieved   PT SHORT TERM GOAL #4   Title Patient negotiates ramps & curbs with RW & prosthesis with minA. (Target Date: 06/10/2015)   Status Achieved   PT SHORT TERM GOAL #5   Title Patient performs standing balance activities with RW support reaching 10" all directions, to floor and manages clothes safely. (Target Date: 06/10/2015)   Baseline Pt able to reach to floor and reach 10" across midline and anteriorly, unable to reach 10" laterally    Time 4   Period Weeks   Status Partially Met           PT Long Term Goals - 05/24/15 1354    PT LONG TERM GOAL #1   Title Patient tolerates wear of prosthesis >90% of awake hours without skin issues or limb tenderness.  (Target Date: 07/08/2015)   Time 8   Period Weeks   Status On-going   PT LONG TERM GOAL #2   Title Patient verbalizes & demonstrates understanding of prosthetic care to enable safe use of prosthesis.  (Target Date: 07/08/2015)   Time 8   Period Weeks   Status On-going   PT LONG TERM GOAL #3   Title Berg Balance > 24/56 to indicate lower fall risk.  (Target Date: 07/08/2015)   Time 8   Period Weeks   Status On-going   PT LONG TERM GOAL #4   Title Timed Up & Go <20 seconds modified independent safely.  (Target Date: 07/08/2015)   Time 8   Period Weeks   Status On-going   PT LONG TERM GOAL #5   Title Patient ambulates 250' with LRAD & prosthesis modified independent to enable safe community mobility.  (Target Date: 07/08/2015)   Time 8   Period Weeks   Status On-going   PT LONG TERM GOAL #6   Title Patient negotiates ramps & curbs with LRAD & prosthesis modified indpendent to enable safe community mobility.  (Target Date: 07/08/2015)   Time 8   Period Weeks   Status On-going               Plan - 06/13/15 1326    Clinical Impression Statement Checked remaining STGs. Partially met reaching goal and met prosthetic wear goal. Pt is improving in ambulation tolerance but requires cues for maintaining step width. Encouraged patient to increase activity level to improve toleraance to wear time and weigtbearing through prosthesis.   Rehab Potential Good   PT Frequency 2x / week   PT Duration 8 weeks   PT Treatment/Interventions ADLs/Self Care Home Management;DME Instruction;Gait training;Stair training;Functional mobility training;Therapeutic activities;Therapeutic exercise;Balance training;Neuromuscular re-education;Patient/family education;Prosthetic Training;Vestibular   PT Next Visit Plan Doorway stretch and others for posturing   Consulted and Agree with Plan of Care Patient      Patient will benefit from skilled therapeutic intervention in order to improve the following deficits and  impairments:  Abnormal gait, Decreased activity tolerance, Decreased balance, Decreased endurance, Decreased knowledge of use of DME, Decreased mobility, Impaired flexibility, Decreased strength, Postural dysfunction,  Prosthetic Dependency, Pain  Visit Diagnosis: Other abnormalities of gait and mobility  Unsteadiness on feet  Muscle weakness (generalized)  Other symptoms and signs involving the musculoskeletal system     Problem List Patient Active Problem List   Diagnosis Date Noted  . Diastolic dysfunction 75/44/9201  . Diabetic neuropathy ([place]) 11/10/2014  . Chronic kidney disease (CKD), stage IV (severe) ([place]) 11/10/2014  . Fracture of distal femur ([place]) 07/08/2014  . Osteoporosis 07/08/2014  . Femur fracture, left ([place]) 07/08/2014  . Colles' fracture of left radius   . Left hip pain 05/03/2014  . Hip pain   . Perimenopausal symptoms 02/13/2012  . Hyponatremia 12/29/2011  . Asthma exacerbation attacks 12/28/2011  . Bronchitis 12/28/2011  . Repeated falls 07/30/2011  . Hip fracture, left ([place]) 07/16/2011  . Femoral neck fracture ([place]) 07/16/2011  . Dyslipidemia 12/23/2008  . DIARRHEA 08/30/2008  . FATIGUE 07/12/2008  . THRUSH 05/20/2008  . Human immunodeficiency virus (HIV) disease ([place]) 04/19/2008  . Diabetes mellitus type 2 with complications, uncontrolled ([place]) 04/19/2008  . Mood disorder ([place]) 04/19/2008  . Hereditary and idiopathic peripheral neuropathy 04/19/2008  . Essential hypertension 04/19/2008   Dillard Essex, SPT  Jamey Reas, PT, DPT 06/13/2015, 8:52 PM  [facility] [address] [place], [place], [address] Phone: [phone]   Fax:  [phone]  Name: JALISHA ENNEKING MRN: [id number] Date of Birth: 1965/06/20

## 2015-06-14 LAB — POCT GLYCOSYLATED HEMOGLOBIN (HGB A1C): HEMOGLOBIN A1C: 7.9

## 2015-06-15 ENCOUNTER — Encounter: Payer: Self-pay | Admitting: Physical Therapy

## 2015-06-15 ENCOUNTER — Ambulatory Visit: Payer: Commercial Managed Care - HMO | Admitting: Physical Therapy

## 2015-06-15 DIAGNOSIS — R29898 Other symptoms and signs involving the musculoskeletal system: Secondary | ICD-10-CM

## 2015-06-15 DIAGNOSIS — R2689 Other abnormalities of gait and mobility: Secondary | ICD-10-CM | POA: Diagnosis not present

## 2015-06-15 DIAGNOSIS — M6281 Muscle weakness (generalized): Secondary | ICD-10-CM

## 2015-06-15 DIAGNOSIS — R2681 Unsteadiness on feet: Secondary | ICD-10-CM | POA: Diagnosis not present

## 2015-06-15 DIAGNOSIS — M79662 Pain in left lower leg: Secondary | ICD-10-CM | POA: Diagnosis not present

## 2015-06-15 NOTE — Therapy (Signed)
[facility] [address] [place], [place], [address] Phone: [phone]   Fax:  [phone]  Physical Therapy Treatment  Patient Details  Name: [name] MRN: [id number] Date of Birth: 1965-04-29 Referring Provider: Frankey Shown, MD  Encounter Date: 06/15/2015   06/15/15 1412  PT Visits / Re-Eval  Visit Number 9  Number of Visits 17  Date for PT Re-Evaluation 07/08/15  Authorization  Authorization Type Medicare G-Code & progress report every 10 visits  PT Time Calculation  PT Start Time 1403  PT Stop Time 1445  PT Time Calculation (min) 42 min  PT - End of Session  Equipment Utilized During Treatment Gait belt  Activity Tolerance Patient tolerated treatment well  Behavior During Therapy [facility] for tasks assessed/performed     Past Medical History  Diagnosis Date  . Anxiety   . Depression   . Hypertension   . HIV infection ([place]) 2000  . Asthma     HOSPITALIZED WITH EXCERBATION OF ASTHMA - AND BRONCHITIS AND THE FLU DEC 2013  . Shortness of breath     ALLERGIES ARE "ACTING UP"  . Diabetes mellitus     ON INSULIN AND ORAL MEDICATIONS  . GERD (gastroesophageal reflux disease)   . Neuropathy     NEUROPATHY HANDS AND FEET  . Arthritis     LEFT HIP  . Pain     SEVERE PAIN LEFT HIP - HX OF LEFT HIP PINNING JULY 2013    Past Surgical History  Procedure Laterality Date  . Hip pinning,cannulated  07/16/2011    Procedure: CANNULATED HIP PINNING;  Surgeon: Gearlean Alf, MD;  [facility];  Service: Orthopedics;  Laterality: Left;  . Hip pinning,cannulated Left 04/09/2012    Procedure: CANNULATED HIP PINNING AND HARDWARE REVISION;  Surgeon: Gearlean Alf, MD;  [facility];  Service: Orthopedics;  Laterality: Left;  . Hip arthroplasty Left 05/05/2014    Procedure: ARTHROPLASTY OPEN REDUCTION INTERNAL FIXATION LEFT HIP AND REMOVAL OF TWO CANNULATED SCREW;  Surgeon: Latanya Maudlin, MD;  [facility];  Service: Orthopedics;  Laterality: Left;  . Cast application Left 03/05/2874    Procedure: CAST APPLICATION (FIBERGLASS);  Surgeon: Latanya Maudlin, MD;  [facility];  Service: Orthopedics;  Laterality: Left;  CLOSED REDUCTION OF LEFT COLLES FRACTURE WITH SHORT ARM CAST  . Hardware removal Left 05/05/2014    Procedure: HARDWARE REMOVAL LEFT HIP;  Surgeon: Latanya Maudlin, MD;  [facility];  Service: Orthopedics;  Laterality: Left;  REMOVAL BIOMET 6.5-8.0 CANNULATED SCREW  . Peripheral vascular catheterization N/A 11/15/2014    Procedure: Abdominal Aortogram;  Surgeon: Conrad Killdeer, MD;  Location: [facility];  Service: Cardiovascular;  Laterality: N/A;  . Peripheral vascular catheterization  11/15/2014    Procedure: Lower Extremity Angiography;  Surgeon: Conrad Masthope, MD;  Location: [facility];  Service: Cardiovascular;;  . Peripheral vascular catheterization Left 11/15/2014    Procedure: Peripheral Vascular Intervention;  Surgeon: Conrad Halliday, MD;  Location: [facility];  Service: Cardiovascular;  Laterality: Left;  popliteal artery stenting  . I&d extremity Left 11/16/2014    Procedure:  DEBRIDEMENT OF LEFT FOOT POSSIBLE APPLICATION OF INTEGRIA AND VAC ;  Surgeon: Irene Limbo, MD;  Location: [facility];  Service: Plastics;  Laterality: Left;  . Amputation Left 11/18/2014    Procedure: LEFT BELOW KNEE AMPUTATION;  Surgeon: Leandrew Koyanagi, MD;  Location: [facility];  Service: Orthopedics;  Laterality: Left;  There were no vitals filed for this visit.      Subjective Assessment - 06/15/15 1407    Subjective Pt reports increased bil knee pain after last session, "hurt so bad she didn't want to get out of bed". A little better today. Did try the standing from a stool at home, her stool is not safet (seat comes off).   Patient is accompained by: Family member   Pertinent History DM2, neuropathy, HIV, mood disorder, HTN, CKD (stage IV), asthma, arthritis, left hip  fracture with ORIF 07/16/2011 and hardware removal with new pinning 05/05/2014   Limitations Lifting;Standing;Walking   Patient Stated Goals to use prosthesis to go in community including shopping, church, [place] meetings   Currently in Pain? Yes   Pain Score 6    Pain Location Back   Pain Orientation Lower   Pain Descriptors / Indicators Aching;Sore   Pain Type Chronic pain   Pain Onset More than a month ago   Pain Frequency Intermittent   Aggravating Factors  increased activity, improper body mechanics/positioning   Pain Relieving Factors rest, cold, pain medication   Multiple Pain Sites Yes   Pain Score 6   Pain Location Knee   Pain Orientation Right;Left   Pain Descriptors / Indicators Aching;Shooting;Sore;Discomfort   Pain Type Chronic pain   Pain Onset More than a month ago   Pain Frequency Intermittent   Aggravating Factors  increased activity   Pain Relieving Factors rest, pain medication, ice        06/15/15 1413  Transfers  Transfers Sit to Stand;Stand to Sit  Sit to Stand 5: Supervision;With upper extremity assist;From chair/3-in-1;With armrests  Sit to Stand Details Verbal cues for sequencing;Verbal cues for technique;Verbal cues for precautions/safety;Tactile cues for sequencing;Tactile cues for weight shifting  Stand to Sit 5: Supervision;With upper extremity assist;To chair/3-in-1;With armrests  Stand to Sit Details (indicate cue type and reason) Verbal cues for safe use of DME/AE;Verbal cues for precautions/safety  Ambulation/Gait  Ambulation/Gait Yes  Ambulation/Gait Assistance 5: Supervision  Ambulation/Gait Assistance Details cues on posture and to correct gait deviations.   Ambulation Distance (Feet) 210 Feet  Assistive device Rolling walker;Prosthesis  Gait Pattern Step-through pattern;Decreased step length - right;Decreased stance time - left;Decreased step length - left;Decreased stride length;Trunk flexed;Narrow base of support  Ambulation Surface Level;Indoor   Prosthetics  Current prosthetic wear tolerance (days/week)  daily  Current prosthetic wear tolerance (#hours/day)  wearing all awake hours with drying every 5 hours and prn  Residual limb condition  skin checked. rash is clearing up. no blisters or open areas noted.  Education Provided Residual limb care;Proper weight-bearing schedule/adjustment  Person(s) Educated Patient  Education Method Explanation;Demonstration;Verbal cues  Education Method Verbalized understanding;Verbal cues required  Donning Prosthesis 5  Doffing Prosthesis 5    Exercises: Reviewed back exercises issued to date: Posterior pelvic tilt, 5 sec holds x 10 reps Single knee to chest stretch, 20 sec holds, x 3 each side Double knee to chest stretch, 20 sec hold x 3 reps Lower trunk rotation left<>right, 10 sec hold x 5 each way  New ones added today: Bridging, 5 sec hold x 10 reps Upper abdominal curls, 3 sec's x 10 reps Seated hamstring stretch, 20 sec x 3 each leg         PT Short Term Goals - 06/13/15 1654    PT SHORT TERM GOAL #1   Title Patient tolerates wear of prosthesis >12hrs/day without skin issues. (Target Date: 06/10/2015)   Status Achieved  PT SHORT TERM GOAL #2   Title Patient properly donnes prosthesis and verbalizes proper adjustment of wear schedule. (Target Date: 06/10/2015)   Status Achieved   PT SHORT TERM GOAL #3   Title Patient ambulates 100' with RW & prosthesis with supervision. (Target Date: 06/10/2015)   Status Achieved   PT SHORT TERM GOAL #4   Title Patient negotiates ramps & curbs with RW & prosthesis with minA. (Target Date: 06/10/2015)   Status Achieved   PT SHORT TERM GOAL #5   Title Patient performs standing balance activities with RW support reaching 10" all directions, to floor and manages clothes safely. (Target Date: 06/10/2015)   Baseline Pt able to reach to floor and reach 10" across midline and anteriorly, unable to reach 10" laterally    Time 4   Period Weeks   Status  Partially Met           PT Long Term Goals - 05/24/15 1354    PT LONG TERM GOAL #1   Title Patient tolerates wear of prosthesis >90% of awake hours without skin issues or limb tenderness. (Target Date: 07/08/2015)   Time 8   Period Weeks   Status On-going   PT LONG TERM GOAL #2   Title Patient verbalizes & demonstrates understanding of prosthetic care to enable safe use of prosthesis.  (Target Date: 07/08/2015)   Time 8   Period Weeks   Status On-going   PT LONG TERM GOAL #3   Title Berg Balance > 24/56 to indicate lower fall risk.  (Target Date: 07/08/2015)   Time 8   Period Weeks   Status On-going   PT LONG TERM GOAL #4   Title Timed Up & Go <20 seconds modified independent safely.  (Target Date: 07/08/2015)   Time 8   Period Weeks   Status On-going   PT LONG TERM GOAL #5   Title Patient ambulates 250' with LRAD & prosthesis modified independent to enable safe community mobility.  (Target Date: 07/08/2015)   Time 8   Period Weeks   Status On-going   PT LONG TERM GOAL #6   Title Patient negotiates ramps & curbs with LRAD & prosthesis modified indpendent to enable safe community mobility.  (Target Date: 07/08/2015)   Time 8   Period Weeks   Status On-going        06/15/15 1412  Plan  Clinical Impression Statement Continued to address prosthetic management and gait today with pt making progress toward her LTGs. Remainder of session addressed back HEP, reveiwed the ones already issued and added a few new ones this session without any issues reported.  Pt will benefit from skilled therapeutic intervention in order to improve on the following deficits Abnormal gait;Decreased activity tolerance;Decreased balance;Decreased endurance;Decreased knowledge of use of DME;Decreased mobility;Impaired flexibility;Decreased strength;Postural dysfunction;Prosthetic Dependency;Pain  Rehab Potential Good  PT Frequency 2x / week  PT Duration 8 weeks  PT Treatment/Interventions ADLs/Self Care Home  Management;DME Instruction;Gait training;Stair training;Functional mobility training;Therapeutic activities;Therapeutic exercise;Balance training;Neuromuscular re-education;Patient/family education;Prosthetic Training;Vestibular  PT Next Visit Plan G-code next visit.Doorway stretch and others for posturing, add to back HEP as able, continue to work on prosthetic management/gait towards LTGs.  Consulted and Agree with Plan of Care Patient      Patient will benefit from skilled therapeutic intervention in order to improve the following deficits and impairments:  Abnormal gait, Decreased activity tolerance, Decreased balance, Decreased endurance, Decreased knowledge of use of DME, Decreased mobility, Impaired flexibility, Decreased strength, Postural dysfunction, Prosthetic Dependency, Pain  Visit Diagnosis: Other abnormalities of gait and mobility  Unsteadiness on feet  Muscle weakness (generalized)  Other symptoms and signs involving the musculoskeletal system  Pain in left lower leg      Problem List Patient Active Problem List   Diagnosis Date Noted  . Diastolic dysfunction 99/37/1696  . Diabetic neuropathy ([place]) 11/10/2014  . Chronic kidney disease (CKD), stage IV (severe) ([place]) 11/10/2014  . Fracture of distal femur ([place]) 07/08/2014  . Osteoporosis 07/08/2014  . Femur fracture, left ([place]) 07/08/2014  . Colles' fracture of left radius   . Left hip pain 05/03/2014  . Hip pain   . Perimenopausal symptoms 02/13/2012  . Hyponatremia 12/29/2011  . Asthma exacerbation attacks 12/28/2011  . Bronchitis 12/28/2011  . Repeated falls 07/30/2011  . Hip fracture, left ([place]) 07/16/2011  . Femoral neck fracture ([place]) 07/16/2011  . Dyslipidemia 12/23/2008  . DIARRHEA 08/30/2008  . FATIGUE 07/12/2008  . THRUSH 05/20/2008  . Human immunodeficiency virus (HIV) disease ([place]) 04/19/2008  . Diabetes mellitus type 2 with complications, uncontrolled ([place]) 04/19/2008  . Mood disorder ([place])  04/19/2008  . Hereditary and idiopathic peripheral neuropathy 04/19/2008  . Essential hypertension 04/19/2008    Willow Ora, PTA, [facility] [address], [place], [place] [address] [phone] 06/18/2015, 7:08 PM   Name: [name] MRN: [id number] Date of Birth: November 05, 1965

## 2015-06-16 DIAGNOSIS — M4806 Spinal stenosis, lumbar region: Secondary | ICD-10-CM | POA: Diagnosis not present

## 2015-06-16 DIAGNOSIS — Z89512 Acquired absence of left leg below knee: Secondary | ICD-10-CM | POA: Diagnosis not present

## 2015-06-16 DIAGNOSIS — Z87891 Personal history of nicotine dependence: Secondary | ICD-10-CM | POA: Diagnosis not present

## 2015-06-16 DIAGNOSIS — G8929 Other chronic pain: Secondary | ICD-10-CM | POA: Diagnosis not present

## 2015-06-16 DIAGNOSIS — Z79891 Long term (current) use of opiate analgesic: Secondary | ICD-10-CM | POA: Diagnosis not present

## 2015-06-20 ENCOUNTER — Ambulatory Visit: Payer: Commercial Managed Care - HMO | Admitting: Physical Therapy

## 2015-06-20 DIAGNOSIS — M6281 Muscle weakness (generalized): Secondary | ICD-10-CM | POA: Diagnosis not present

## 2015-06-20 DIAGNOSIS — R29898 Other symptoms and signs involving the musculoskeletal system: Secondary | ICD-10-CM | POA: Diagnosis not present

## 2015-06-20 DIAGNOSIS — R2689 Other abnormalities of gait and mobility: Secondary | ICD-10-CM | POA: Diagnosis not present

## 2015-06-20 DIAGNOSIS — R2681 Unsteadiness on feet: Secondary | ICD-10-CM | POA: Diagnosis not present

## 2015-06-20 DIAGNOSIS — M79662 Pain in left lower leg: Secondary | ICD-10-CM | POA: Diagnosis not present

## 2015-06-21 ENCOUNTER — Encounter: Payer: Self-pay | Admitting: Physical Therapy

## 2015-06-21 NOTE — Therapy (Signed)
[facility] [address] [place], [place], [address] Phone: [phone]   Fax:  [phone]  Patient Details  Name: [name] MRN: [id number] Date of Birth: 06/28/1965 Referring Provider:   Frankey Shown, MD  Encounter Date: 06/21/2015  Physical Therapy Progress Note  Dates of Reporting Period: 05/11/2015 to 07-04-15  Objective Reports of Subjective Statement: Patient reports increased prosthesis wear & use which is improving her mobility & safety.   Objective Measurements:      G-Codes - 07/04/15 1530    Functional Assessment Tool Used Patient is tolerating wear >12hrs of 15-16 awake hours. She requires cues on prosthetic wear and sock adjustment. She requires skilled instruction for problem solving prosthetic issues.    Functional Limitation Self care   Self Care Current Status [phone]) At least 20 percent but less than 40 percent impaired, limited or restricted   Self Care Goal Status (I3704) At least 1 percent but less than 20 percent impaired, limited or restricted      Goal Update:      PT Short Term Goals - 06/13/15 1654    PT SHORT TERM GOAL #1   Title Patient tolerates wear of prosthesis >12hrs/day without skin issues. (Target Date: 06/10/2015)   Status Achieved   PT SHORT TERM GOAL #2   Title Patient properly donnes prosthesis and verbalizes proper adjustment of wear schedule. (Target Date: 06/10/2015)   Status Achieved   PT SHORT TERM GOAL #3   Title Patient ambulates 100' with RW & prosthesis with supervision. (Target Date: 06/10/2015)   Status Achieved   PT SHORT TERM GOAL #4   Title Patient negotiates ramps & curbs with RW & prosthesis with minA. (Target Date: 06/10/2015)   Status Achieved   PT SHORT TERM GOAL #5   Title Patient performs standing balance activities with RW support reaching 10" all directions, to floor and manages clothes safely. (Target Date: 06/10/2015)   Baseline Pt able to reach to  floor and reach 10" across midline and anteriorly, unable to reach 10" laterally    Time 4   Period Weeks   Status Partially Met      Plan: Continue established plan of care.   Reason Skilled Services are Required: Patient requires skilled instruction to maximize function & safety with prosthesis.     Jamey Reas PT, DPT 06/21/2015, 11:13 AM  [facility] [address] [place], [place], [address] Phone: [phone]   Fax:  [phone]

## 2015-06-21 NOTE — Therapy (Signed)
[facility] [address] [place], [place], [address] Phone: [phone]   Fax:  [phone]  Physical Therapy Treatment  Patient Details  Name: [name] MRN: [id number] Date of Birth: 1965/01/07 Referring Provider: Frankey Shown, MD  Encounter Date: 06/20/2015      PT End of Session - 06/20/15 1330    Visit Number 10   Number of Visits 17   Date for PT Re-Evaluation 07/08/15   Authorization Type Medicare G-Code & progress report every 10 visits   PT Start Time 1233   PT Stop Time 1320   PT Time Calculation (min) 47 min   Equipment Utilized During Treatment Gait belt   Activity Tolerance Patient tolerated treatment well   Behavior During Therapy [facility] for tasks assessed/performed      Past Medical History  Diagnosis Date  . Anxiety   . Depression   . Hypertension   . HIV infection ([place]) 2000  . Asthma     HOSPITALIZED WITH EXCERBATION OF ASTHMA - AND BRONCHITIS AND THE FLU DEC 2013  . Shortness of breath     ALLERGIES ARE "ACTING UP"  . Diabetes mellitus     ON INSULIN AND ORAL MEDICATIONS  . GERD (gastroesophageal reflux disease)   . Neuropathy     NEUROPATHY HANDS AND FEET  . Arthritis     LEFT HIP  . Pain     SEVERE PAIN LEFT HIP - HX OF LEFT HIP PINNING JULY 2013    Past Surgical History  Procedure Laterality Date  . Hip pinning,cannulated  07/16/2011    Procedure: CANNULATED HIP PINNING;  Surgeon: Gearlean Alf, MD;  Location: [facility];  Service: Orthopedics;  Laterality: Left;  . Hip pinning,cannulated Left 04/09/2012    Procedure: CANNULATED HIP PINNING AND HARDWARE REVISION;  Surgeon: Gearlean Alf, MD;  Location: [facility];  Service: Orthopedics;  Laterality: Left;  . Hip arthroplasty Left 05/05/2014    Procedure: ARTHROPLASTY OPEN REDUCTION INTERNAL FIXATION LEFT HIP AND REMOVAL OF TWO CANNULATED SCREW;  Surgeon: Latanya Maudlin, MD;  Location: [facility];  Service: Orthopedics;  Laterality: Left;   . Cast application Left 08/01/8561    Procedure: CAST APPLICATION (FIBERGLASS);  Surgeon: Latanya Maudlin, MD;  Location: [facility];  Service: Orthopedics;  Laterality: Left;  CLOSED REDUCTION OF LEFT COLLES FRACTURE WITH SHORT ARM CAST  . Hardware removal Left 05/05/2014    Procedure: HARDWARE REMOVAL LEFT HIP;  Surgeon: Latanya Maudlin, MD;  Location: [facility];  Service: Orthopedics;  Laterality: Left;  REMOVAL BIOMET 6.5-8.0 CANNULATED SCREW  . Peripheral vascular catheterization N/A 11/15/2014    Procedure: Abdominal Aortogram;  Surgeon: Conrad [place] Milton, MD;  Location: [facility];  Service: Cardiovascular;  Laterality: N/A;  . Peripheral vascular catheterization  11/15/2014    Procedure: Lower Extremity Angiography;  Surgeon: Conrad [place], MD;  Location: [facility];  Service: Cardiovascular;;  . Peripheral vascular catheterization Left 11/15/2014    Procedure: Peripheral Vascular Intervention;  Surgeon: Conrad Chesterfield, MD;  Location: [facility];  Service: Cardiovascular;  Laterality: Left;  popliteal artery stenting  . I&d extremity Left 11/16/2014    Procedure:  DEBRIDEMENT OF LEFT FOOT POSSIBLE APPLICATION OF INTEGRIA AND VAC ;  Surgeon: Irene Limbo, MD;  Location: [facility];  Service: Plastics;  Laterality: Left;  . Amputation Left 11/18/2014    Procedure: LEFT BELOW KNEE AMPUTATION;  Surgeon: Leandrew Koyanagi, MD;  Location: [facility];  Service: Orthopedics;  Laterality: Left;    There were no vitals filed for this visit.      Subjective Assessment - 06/21/15 0917    Subjective Reports back pain is under better control with exercises. She fell in bathroom but denies injuries.    Patient is accompained by: Family member   Pertinent History DM2, neuropathy, HIV, mood disorder, HTN, CKD (stage IV), asthma, arthritis, left hip fracture with ORIF 07/16/2011 and hardware removal with new pinning 05/05/2014   Limitations Lifting;Standing;Walking   Patient Stated Goals to use prosthesis to  go in community including shopping, church, [employer] meetings   Currently in Pain? Yes   Pain Score 5    Pain Location Back   Pain Orientation Lower   Pain Descriptors / Indicators Aching;Sore   Pain Type Chronic pain   Pain Onset More than a month ago   Pain Frequency Intermittent   Aggravating Factors  increased activity, improper body mechanics / positioning    Pain Relieving Factors rest, cold, pain medication   Multiple Pain Sites Yes   Pain Score 6   Pain Location Knee   Pain Orientation Right;Left   Pain Descriptors / Indicators Aching;Shooting;Sore;Discomfort   Pain Type Chronic pain   Pain Onset More than a month ago   Pain Frequency Intermittent   Aggravating Factors  over doing it   Pain Relieving Factors rest                         [facility] Adult PT Treatment/Exercise - 06/20/15 1230    Transfers   Transfers Sit to Stand;Stand to [employer] Transfers   Sit to Stand 5: Supervision;With upper extremity assist;From chair/3-in-1;With armrests   Sit to Stand Details Verbal cues for sequencing;Verbal cues for technique;Verbal cues for precautions/safety;Tactile cues for sequencing;Tactile cues for weight shifting   Sit to Stand Details (indicate cue type and reason) PT instructed in rollator use / safety   Stand to Sit 5: Supervision;With upper extremity assist;To chair/3-in-1;With armrests   Stand to Sit Details (indicate cue type and reason) Verbal cues for safe use of DME/AE;Verbal cues for precautions/safety   Stand to Sit Details PT instructed in rollator use /safety   Stand Pivot Transfers 4: Min guard;4: Min assist  turning 180* tp sit & stand from rollator seat   Stand Pivot Transfer Details (indicate cue type and reason) PT demo, instructed prior and tactile, verbal cues during on technique with rollator walker.   Ambulation/Gait   Ambulation/Gait Yes   Ambulation/Gait Assistance 4: Min assist;4: Min guard  progressed from MinA to [employer] guard    Ambulation/Gait Assistance Details PT demo, instructed rollator walker safety & use prior and manual, verbal cues during. Initially PT had to stabilize rollator but progressed to supervision for rollator management.    Ambulation Distance (Feet) 45 Feet  45' X 3   Assistive device Prosthesis;Rollator   Gait Pattern Step-through pattern;Decreased step length - right;Decreased stance time - left;Decreased step length - left;Decreased stride length;Trunk flexed;Narrow base of support   Ambulation Surface Indoor;Level   Ramp Other (comment)   Ramp Details (indicate cue type and reason) PT demo, instructed how to use rollator walker as introduction with plans to work on next session   Curb Other (comment)   Curb Details (indicate cue type and reason) PT demo, instructed how to use rollator walker as introduction with plans to work on next session   Prosthetics   Current prosthetic wear tolerance (days/week)  daily  Current prosthetic wear tolerance (#hours/day)  wearing >12hrs of 15-16 awake hours with drying every 5 hours and prn   Residual limb condition  skin checked. rash is clearing up. no blisters or open areas noted.   Education Provided Residual limb care;Proper weight-bearing schedule/adjustment;Proper wear schedule/adjustment;Correct ply sock adjustment   Person(s) Educated Patient   Education Method Explanation;Verbal cues   Education Method Verbalized understanding                PT Education - 06/20/15 1530    Education provided Yes   Education Details increasing activity level with initiating walking in / out of community buildings with family following with w/c when needs to rest. In home work to increase frequency of gait.    Person(s) Educated Patient   Methods Explanation   Comprehension Verbalized understanding;Need further instruction          PT Short Term Goals - 06/13/15 1654    PT SHORT TERM GOAL #1   Title Patient tolerates wear of prosthesis >12hrs/day  without skin issues. (Target Date: 06/10/2015)   Status Achieved   PT SHORT TERM GOAL #2   Title Patient properly donnes prosthesis and verbalizes proper adjustment of wear schedule. (Target Date: 06/10/2015)   Status Achieved   PT SHORT TERM GOAL #3   Title Patient ambulates 100' with RW & prosthesis with supervision. (Target Date: 06/10/2015)   Status Achieved   PT SHORT TERM GOAL #4   Title Patient negotiates ramps & curbs with RW & prosthesis with minA. (Target Date: 06/10/2015)   Status Achieved   PT SHORT TERM GOAL #5   Title Patient performs standing balance activities with RW support reaching 10" all directions, to floor and manages clothes safely. (Target Date: 06/10/2015)   Baseline Pt able to reach to floor and reach 10" across midline and anteriorly, unable to reach 10" laterally    Time 4   Period Weeks   Status Partially Met           PT Long Term Goals - 05/24/15 1354    PT LONG TERM GOAL #1   Title Patient tolerates wear of prosthesis >90% of awake hours without skin issues or limb tenderness. (Target Date: 07/08/2015)   Time 8   Period Weeks   Status On-going   PT LONG TERM GOAL #2   Title Patient verbalizes & demonstrates understanding of prosthetic care to enable safe use of prosthesis.  (Target Date: 07/08/2015)   Time 8   Period Weeks   Status On-going   PT LONG TERM GOAL #3   Title Berg Balance > 24/56 to indicate lower fall risk.  (Target Date: 07/08/2015)   Time 8   Period Weeks   Status On-going   PT LONG TERM GOAL #4   Title Timed Up & Go <20 seconds modified independent safely.  (Target Date: 07/08/2015)   Time 8   Period Weeks   Status On-going   PT LONG TERM GOAL #5   Title Patient ambulates 250' with LRAD & prosthesis modified independent to enable safe community mobility.  (Target Date: 07/08/2015)   Time 8   Period Weeks   Status On-going   PT LONG TERM GOAL #6   Title Patient negotiates ramps & curbs with LRAD & prosthesis modified indpendent to enable  safe community mobility.  (Target Date: 07/08/2015)   Time 8   Period Weeks   Status On-going  Plan - 2015/06/27 1530    Clinical Impression Statement Patient needs more instruction to safely use rollator walker. Patient would benefit from rollator walker over standard RW to enable resting for community and could then use walker over w/c to enable higher activity level and she could use seat to carry items.    Rehab Potential Good   PT Frequency 2x / week   PT Duration 8 weeks   PT Treatment/Interventions ADLs/Self Care Home Management;DME Instruction;Gait training;Stair training;Functional mobility training;Therapeutic activities;Therapeutic exercise;Balance training;Neuromuscular re-education;Patient/family education;Prosthetic Training;Vestibular   PT Next Visit Plan Doorway stretch and others for posturing, add to back HEP as able, continue to work on prosthetic management/gait with rollator walker towards LTGs.   Consulted and Agree with Plan of Care Patient      Patient will benefit from skilled therapeutic intervention in order to improve the following deficits and impairments:  Abnormal gait, Decreased activity tolerance, Decreased balance, Decreased endurance, Decreased knowledge of use of DME, Decreased mobility, Impaired flexibility, Decreased strength, Postural dysfunction, Prosthetic Dependency, Pain  Visit Diagnosis: Other abnormalities of gait and mobility  Unsteadiness on feet  Muscle weakness (generalized)  Other symptoms and signs involving the musculoskeletal system       G-Codes - 06-27-2015 1530    Functional Assessment Tool Used Patient is tolerating wear >12hrs of 15-16 awake hours. She requires cues on prosthetic wear and sock adjustment. She requires skilled instruction for problem solving prosthetic issues.    Functional Limitation Self care   Self Care Current Status [phone]) At least 20 percent but less than 40 percent impaired, limited or  restricted   Self Care Goal Status (Q3335) At least 1 percent but less than 20 percent impaired, limited or restricted      Problem List Patient Active Problem List   Diagnosis Date Noted  . Diastolic dysfunction 45/62/5638  . Diabetic neuropathy ([place]) 11/10/2014  . Chronic kidney disease (CKD), stage IV (severe) ([place]) 11/10/2014  . Fracture of distal femur ([place]) 07/08/2014  . Osteoporosis 07/08/2014  . Femur fracture, left ([place]) 07/08/2014  . Colles' fracture of left radius   . Left hip pain 05/03/2014  . Hip pain   . Perimenopausal symptoms 02/13/2012  . Hyponatremia 12/29/2011  . Asthma exacerbation attacks 12/28/2011  . Bronchitis 12/28/2011  . Repeated falls 07/30/2011  . Hip fracture, left ([place]) 07/16/2011  . Femoral neck fracture ([place]) 07/16/2011  . Dyslipidemia 12/23/2008  . DIARRHEA 08/30/2008  . FATIGUE 07/12/2008  . THRUSH 05/20/2008  . Human immunodeficiency virus (HIV) disease ([place]) 04/19/2008  . Diabetes mellitus type 2 with complications, uncontrolled ([place]) 04/19/2008  . Mood disorder ([place]) 04/19/2008  . Hereditary and idiopathic peripheral neuropathy 04/19/2008  . Essential hypertension 04/19/2008    Jamey Reas PT, DPT 06/21/2015, 10:05 AM  [facility] [address] [place], [place], [address] Phone: [phone]   Fax:  [phone]  Name: [name] MRN: [id number] Date of Birth: 1965/08/15

## 2015-06-22 ENCOUNTER — Ambulatory Visit: Payer: Commercial Managed Care - HMO | Admitting: Physical Therapy

## 2015-06-27 ENCOUNTER — Telehealth: Payer: Self-pay | Admitting: Physical Therapy

## 2015-06-27 ENCOUNTER — Telehealth: Payer: Self-pay | Admitting: Physician Assistant

## 2015-06-27 ENCOUNTER — Ambulatory Visit: Payer: Commercial Managed Care - HMO | Admitting: Physical Therapy

## 2015-06-27 DIAGNOSIS — M6281 Muscle weakness (generalized): Secondary | ICD-10-CM

## 2015-06-27 DIAGNOSIS — R2689 Other abnormalities of gait and mobility: Secondary | ICD-10-CM | POA: Diagnosis not present

## 2015-06-27 DIAGNOSIS — R2681 Unsteadiness on feet: Secondary | ICD-10-CM | POA: Diagnosis not present

## 2015-06-27 DIAGNOSIS — R29898 Other symptoms and signs involving the musculoskeletal system: Secondary | ICD-10-CM

## 2015-06-27 DIAGNOSIS — M79662 Pain in left lower leg: Secondary | ICD-10-CM | POA: Diagnosis not present

## 2015-06-27 NOTE — Telephone Encounter (Signed)
This pt [name] is progressing in therapy with the use of a rollator. She would benefit from this due to the ability to have seated rest breaks during gait. Please write a prescription for rollator for this patient. Either place the order in [facility] or Please fax the prescription to [phone].    Thank you  Dillard Essex, SPT Jamey Reas, PT, DPT

## 2015-06-27 NOTE — Therapy (Signed)
[facility] [address] [place], [place], [address] Phone: [phone]   Fax:  [phone]  Physical Therapy Treatment  Patient Details  Name: [name] MRN: [id number] Date of Birth: 1965-05-02 Referring Provider: Frankey Shown, MD  Encounter Date: 06/27/2015      PT End of Session - 06/27/15 1255    Visit Number 11   Number of Visits 17   Date for PT Re-Evaluation 07/08/15   Authorization Type Medicare G-Code & progress report every 10 visits   PT Start Time 1220   PT Stop Time 1302   PT Time Calculation (min) 42 min   Equipment Utilized During Treatment Gait belt   Activity Tolerance Patient tolerated treatment well   Behavior During Therapy [facility] for tasks assessed/performed      Past Medical History  Diagnosis Date  . Anxiety   . Depression   . Hypertension   . HIV infection ([place]) 2000  . Asthma     HOSPITALIZED WITH EXCERBATION OF ASTHMA - AND BRONCHITIS AND THE FLU DEC 2013  . Shortness of breath     ALLERGIES ARE "ACTING UP"  . Diabetes mellitus     ON INSULIN AND ORAL MEDICATIONS  . GERD (gastroesophageal reflux disease)   . Neuropathy     NEUROPATHY HANDS AND FEET  . Arthritis     LEFT HIP  . Pain     SEVERE PAIN LEFT HIP - HX OF LEFT HIP PINNING JULY 2013    Past Surgical History  Procedure Laterality Date  . Hip pinning,cannulated  07/16/2011    Procedure: CANNULATED HIP PINNING;  Surgeon: Gearlean Alf, MD;  [facility];  Service: Orthopedics;  Laterality: Left;  . Hip pinning,cannulated Left 04/09/2012    Procedure: CANNULATED HIP PINNING AND HARDWARE REVISION;  Surgeon: Gearlean Alf, MD;  [facility];  Service: Orthopedics;  Laterality: Left;  . Hip arthroplasty Left 05/05/2014    Procedure: ARTHROPLASTY OPEN REDUCTION INTERNAL FIXATION LEFT HIP AND REMOVAL OF TWO CANNULATED SCREW;  Surgeon: Latanya Maudlin, MD;  [facility];  Service: Orthopedics;  Laterality: Left;   . Cast application Left 09/04/4965    Procedure: CAST APPLICATION (FIBERGLASS);  Surgeon: Latanya Maudlin, MD;  [facility];  Service: Orthopedics;  Laterality: Left;  CLOSED REDUCTION OF LEFT COLLES FRACTURE WITH SHORT ARM CAST  . Hardware removal Left 05/05/2014    Procedure: HARDWARE REMOVAL LEFT HIP;  Surgeon: Latanya Maudlin, MD;  [facility];  Service: Orthopedics;  Laterality: Left;  REMOVAL BIOMET 6.5-8.0 CANNULATED SCREW  . Peripheral vascular catheterization N/A 11/15/2014    Procedure: Abdominal Aortogram;  Surgeon: Conrad [place], MD;  Location: [facility];  Service: Cardiovascular;  Laterality: N/A;  . Peripheral vascular catheterization  11/15/2014    Procedure: Lower Extremity Angiography;  Surgeon: Conrad [place], MD;  Location: [facility];  Service: Cardiovascular;;  . Peripheral vascular catheterization Left 11/15/2014    Procedure: Peripheral Vascular Intervention;  Surgeon: Conrad [place], MD;  Location: [facility];  Service: Cardiovascular;  Laterality: Left;  popliteal artery stenting  . I&d extremity Left 11/16/2014    Procedure:  DEBRIDEMENT OF LEFT FOOT POSSIBLE APPLICATION OF INTEGRIA AND VAC ;  Surgeon: Irene Limbo, MD;  Location: [facility];  Service: Plastics;  Laterality: Left;  . Amputation Left 11/18/2014    Procedure: LEFT BELOW KNEE AMPUTATION;  Surgeon: Leandrew Koyanagi, MD;  Location: [facility];  Service: Orthopedics;  Laterality: Left;    There were no vitals filed for this visit.      Subjective Assessment - 06/27/15 1223    Subjective Pt brought in pt owned rollator. No falls or issues. Feels very weak   Patient is accompained by: Family member   Pertinent History DM2, neuropathy, HIV, mood disorder, HTN, CKD (stage IV), asthma, arthritis, left hip fracture with ORIF 07/16/2011 and hardware removal with new pinning 05/05/2014   Limitations Lifting;Standing;Walking   Patient Stated Goals to use prosthesis to go in community including  shopping, church, [place] meetings   Pain Onset More than a month ago   Pain Onset More than a month ago             Prosthetics Assessment - 06/27/15 0001    Prosthetics   Current prosthetic wear tolerance (days/week)  daily    Current prosthetic wear tolerance (#hours/day)  wearing >12hrs of 15-16 awake hours with drying every 5 hours and prn                    [facility] Adult PT Treatment/Exercise - 06/27/15 1230    Transfers   Transfers Sit to Stand;Stand to [employer] Transfers   Sit to Stand 5: Supervision;With upper extremity assist;From chair/3-in-1;With armrests   Sit to Stand Details Verbal cues for sequencing;Verbal cues for technique;Verbal cues for precautions/safety;Tactile cues for sequencing;Tactile cues for weight shifting   Stand to Sit 5: Supervision;With upper extremity assist;To chair/3-in-1;With armrests   Stand to Sit Details (indicate cue type and reason) Verbal cues for safe use of DME/AE;Verbal cues for precautions/safety   Ambulation/Gait   Ambulation/Gait Yes   Ambulation/Gait Assistance 5: Supervision  progressed from MinA to [place] guard   Ambulation/Gait Assistance Details verbal cues for montioring pain while ambulating. Pt required mulitple rest breaks on rollator seat due to back pain. Gait with 1/2 heel lift in R shoe, due to palpated leg length diference in standing   Ambulation Distance (Feet) 60 Feet  x1, 20 x1, 20 x1, 60x1   Assistive device Prosthesis;Rollator   Gait Pattern Step-through pattern;Decreased step length - right;Decreased stance time - left;Decreased step length - left;Decreased stride length;Trunk flexed;Narrow base of support   Ambulation Surface Indoor;Level   Ramp 4: Min assist   Ramp Details (indicate cue type and reason) cues for posturing and rollator sequencing with prosthesis    Curb 5: Supervision   Curb Details (indicate cue type and reason) Verbal cues for prosthetic knee sequencing with rollator   Prosthetics    Current prosthetic wear tolerance (days/week)  daily   Current prosthetic wear tolerance (#hours/day)  wearing >12hrs of 15-16 awake hours with drying every 5 hours and prn   Residual limb condition  skin checked. rash is clearing up. no blisters or open areas noted.   Education Provided Residual limb care;Proper weight-bearing schedule/adjustment;Proper wear schedule/adjustment;Correct ply sock adjustment                PT Education - 06/27/15 1224    Education provided Yes   Education Details Educated on managing pt owned rollator   Person(s) Educated Patient   Methods Explanation;Demonstration   Comprehension Verbalized understanding          PT Short Term Goals - 06/13/15 1654    PT SHORT TERM GOAL #1   Title Patient tolerates wear of prosthesis >12hrs/day without skin issues. (Target Date: 06/10/2015)   Status Achieved   PT SHORT TERM GOAL #2   Title Patient  properly donnes prosthesis and verbalizes proper adjustment of wear schedule. (Target Date: 06/10/2015)   Status Achieved   PT SHORT TERM GOAL #3   Title Patient ambulates 100' with RW & prosthesis with supervision. (Target Date: 06/10/2015)   Status Achieved   PT SHORT TERM GOAL #4   Title Patient negotiates ramps & curbs with RW & prosthesis with minA. (Target Date: 06/10/2015)   Status Achieved   PT SHORT TERM GOAL #5   Title Patient performs standing balance activities with RW support reaching 10" all directions, to floor and manages clothes safely. (Target Date: 06/10/2015)   Baseline Pt able to reach to floor and reach 10" across midline and anteriorly, unable to reach 10" laterally    Time 4   Period Weeks   Status Partially Met           PT Long Term Goals - 05/24/15 1354    PT LONG TERM GOAL #1   Title Patient tolerates wear of prosthesis >90% of awake hours without skin issues or limb tenderness. (Target Date: 07/08/2015)   Time 8   Period Weeks   Status On-going   PT LONG TERM GOAL #2   Title  Patient verbalizes & demonstrates understanding of prosthetic care to enable safe use of prosthesis.  (Target Date: 07/08/2015)   Time 8   Period Weeks   Status On-going   PT LONG TERM GOAL #3   Title Berg Balance > 24/56 to indicate lower fall risk.  (Target Date: 07/08/2015)   Time 8   Period Weeks   Status On-going   PT LONG TERM GOAL #4   Title Timed Up & Go <20 seconds modified independent safely.  (Target Date: 07/08/2015)   Time 8   Period Weeks   Status On-going   PT LONG TERM GOAL #5   Title Patient ambulates 250' with LRAD & prosthesis modified independent to enable safe community mobility.  (Target Date: 07/08/2015)   Time 8   Period Weeks   Status On-going   PT LONG TERM GOAL #6   Title Patient negotiates ramps & curbs with LRAD & prosthesis modified indpendent to enable safe community mobility.  (Target Date: 07/08/2015)   Time 8   Period Weeks   Status On-going               Plan - 06/27/15 1255    Clinical Impression Statement Pt brought in mothers rollator noted issues with brakes. Trailed gait with rollator, supervision. The pt still requires multiple rest breaks due to continued back pain. The pt required minimal cuing for ramp and curb with rollator   Rehab Potential Good   PT Frequency 2x / week   PT Duration 8 weeks   PT Treatment/Interventions ADLs/Self Care Home Management;DME Instruction;Gait training;Stair training;Functional mobility training;Therapeutic activities;Therapeutic exercise;Balance training;Neuromuscular re-education;Patient/family education;Prosthetic Training;Vestibular   PT Next Visit Plan gait, ramp and curbwith rollator   Consulted and Agree with Plan of Care Patient      Patient will benefit from skilled therapeutic intervention in order to improve the following deficits and impairments:  Abnormal gait, Decreased activity tolerance, Decreased balance, Decreased endurance, Decreased knowledge of use of DME, Decreased mobility, Impaired  flexibility, Decreased strength, Postural dysfunction, Prosthetic Dependency, Pain  Visit Diagnosis: Other abnormalities of gait and mobility  Unsteadiness on feet  Muscle weakness (generalized)  Other symptoms and signs involving the musculoskeletal system     Problem List Patient Active Problem List   Diagnosis Date Noted  . Diastolic  dysfunction 11/10/2014  . Diabetic neuropathy ([place]) 11/10/2014  . Chronic kidney disease (CKD), stage IV (severe) ([place]) 11/10/2014  . Fracture of distal femur ([place]) 07/08/2014  . Osteoporosis 07/08/2014  . Femur fracture, left ([place]) 07/08/2014  . Colles' fracture of left radius   . Left hip pain 05/03/2014  . Hip pain   . Perimenopausal symptoms 02/13/2012  . Hyponatremia 12/29/2011  . Asthma exacerbation attacks 12/28/2011  . Bronchitis 12/28/2011  . Repeated falls 07/30/2011  . Hip fracture, left ([place]) 07/16/2011  . Femoral neck fracture ([place]) 07/16/2011  . Dyslipidemia 12/23/2008  . DIARRHEA 08/30/2008  . FATIGUE 07/12/2008  . THRUSH 05/20/2008  . Human immunodeficiency virus (HIV) disease ([place]) 04/19/2008  . Diabetes mellitus type 2 with complications, uncontrolled ([place]) 04/19/2008  . Mood disorder ([place]) 04/19/2008  . Hereditary and idiopathic peripheral neuropathy 04/19/2008  . Essential hypertension 04/19/2008    Dillard Essex, SPT 06/27/2015, 1:03 PM  [facility] [address] [place], [place], [address] Phone: [phone]   Fax:  [phone]  Name: MILDRED TUCCILLO MRN: [id number] Date of Birth: [birth year]

## 2015-06-27 NOTE — Telephone Encounter (Signed)
Ms. Varnell needs a rollator walker with a seat for safety with gait and allowing for rest periods for community gait. Can you please write a prescription for a rollator walker with seat and either place in Epic or FAX to [phone]? Thank you Jamey Reas, PT, DPT [facility] Phone:  [phone]  Fax:  [phone] [facility] [address] [place], [place] [address]

## 2015-06-29 ENCOUNTER — Other Ambulatory Visit: Payer: Self-pay | Admitting: Infectious Diseases

## 2015-06-29 ENCOUNTER — Other Ambulatory Visit: Payer: Self-pay | Admitting: Endocrinology

## 2015-06-29 ENCOUNTER — Encounter: Payer: Self-pay | Admitting: Physical Therapy

## 2015-06-29 ENCOUNTER — Ambulatory Visit: Payer: Commercial Managed Care - HMO | Admitting: Physical Therapy

## 2015-06-29 DIAGNOSIS — R2681 Unsteadiness on feet: Secondary | ICD-10-CM

## 2015-06-29 DIAGNOSIS — M6281 Muscle weakness (generalized): Secondary | ICD-10-CM

## 2015-06-29 DIAGNOSIS — R2689 Other abnormalities of gait and mobility: Secondary | ICD-10-CM | POA: Diagnosis not present

## 2015-06-29 DIAGNOSIS — M79662 Pain in left lower leg: Secondary | ICD-10-CM

## 2015-06-29 DIAGNOSIS — R29898 Other symptoms and signs involving the musculoskeletal system: Secondary | ICD-10-CM

## 2015-06-30 NOTE — Therapy (Signed)
[facility] [address] [place], [place], [address] Phone: [phone]   Fax:  [phone]  Physical Therapy Treatment  Patient Details  Name: [name] MRN: [id number] Date of Birth: [birth year] Referring Provider: Frankey Shown, MD  Encounter Date: 06/29/2015      PT End of Session - 06/29/15 1409    Visit Number 12   Number of Visits 17   Date for PT Re-Evaluation 07/08/15   Authorization Type Medicare G-Code & progress report every 10 visits   PT Start Time 1403   PT Stop Time 1445   PT Time Calculation (min) 42 min   Equipment Utilized During Treatment Gait belt   Activity Tolerance Patient tolerated treatment well   Behavior During Therapy [facility] for tasks assessed/performed      Past Medical History  Diagnosis Date  . Anxiety   . Depression   . Hypertension   . HIV infection ([place]) 2000  . Asthma     HOSPITALIZED WITH EXCERBATION OF ASTHMA - AND BRONCHITIS AND THE FLU DEC 2013  . Shortness of breath     ALLERGIES ARE "ACTING UP"  . Diabetes mellitus     ON INSULIN AND ORAL MEDICATIONS  . GERD (gastroesophageal reflux disease)   . Neuropathy     NEUROPATHY HANDS AND FEET  . Arthritis     LEFT HIP  . Pain     SEVERE PAIN LEFT HIP - HX OF LEFT HIP PINNING JULY 2013    Past Surgical History  Procedure Laterality Date  . Hip pinning,cannulated  07/16/2011    Procedure: CANNULATED HIP PINNING;  Surgeon: Gearlean Alf, MD;  [facility];  Service: Orthopedics;  Laterality: Left;  . Hip pinning,cannulated Left 04/09/2012    Procedure: CANNULATED HIP PINNING AND HARDWARE REVISION;  Surgeon: Gearlean Alf, MD;  [facility];  Service: Orthopedics;  Laterality: Left;  . Hip arthroplasty Left 05/05/2014    Procedure: ARTHROPLASTY OPEN REDUCTION INTERNAL FIXATION LEFT HIP AND REMOVAL OF TWO CANNULATED SCREW;  Surgeon: Latanya Maudlin, MD;  [facility];  Service: Orthopedics;  Laterality: Left;   . Cast application Left 09/02/3298    Procedure: CAST APPLICATION (FIBERGLASS);  Surgeon: Latanya Maudlin, MD;  [facility];  Service: Orthopedics;  Laterality: Left;  CLOSED REDUCTION OF LEFT COLLES FRACTURE WITH SHORT ARM CAST  . Hardware removal Left 05/05/2014    Procedure: HARDWARE REMOVAL LEFT HIP;  Surgeon: Latanya Maudlin, MD;  [facility];  Service: Orthopedics;  Laterality: Left;  REMOVAL BIOMET 6.5-8.0 CANNULATED SCREW  . Peripheral vascular catheterization N/A 11/15/2014    Procedure: Abdominal Aortogram;  Surgeon: Conrad [place], MD;  Location: [facility];  Service: Cardiovascular;  Laterality: N/A;  . Peripheral vascular catheterization  11/15/2014    Procedure: Lower Extremity Angiography;  Surgeon: Conrad [place], MD;  Location: [facility];  Service: Cardiovascular;;  . Peripheral vascular catheterization Left 11/15/2014    Procedure: Peripheral Vascular Intervention;  Surgeon: Conrad , MD;  Location: [facility];  Service: Cardiovascular;  Laterality: Left;  popliteal artery stenting  . I&d extremity Left 11/16/2014    Procedure:  DEBRIDEMENT OF LEFT FOOT POSSIBLE APPLICATION OF INTEGRIA AND VAC ;  Surgeon: Irene Limbo, MD;  Location: [facility];  Service: Plastics;  Laterality: Left;  . Amputation Left 11/18/2014    Procedure: LEFT BELOW KNEE AMPUTATION;  Surgeon: Leandrew Koyanagi, MD;  Location: [facility];  Service: Orthopedics;  Laterality: Left;    There were no vitals filed for this visit.      Subjective Assessment - 06/29/15 1406    Subjective Pt was unable to get her rollator brake fixed, so she bought a new one. No falls or pain to report.   Patient is accompained by: Family member   Pertinent History DM2, neuropathy, HIV, mood disorder, HTN, CKD (stage IV), asthma, arthritis, left hip fracture with ORIF 07/16/2011 and hardware removal with new pinning 05/05/2014   Limitations Lifting;Standing;Walking   Patient Stated Goals to use prosthesis to go  in community including shopping, church, [place] meetings   Currently in Pain? No/denies   Pain Score 0-No pain   Pain Location --            [facility] Adult PT Treatment/Exercise - 06/29/15 1410    Transfers   Transfers Sit to Stand;Stand to Sit   Sit to Stand 5: Supervision;With upper extremity assist;From chair/3-in-1;With armrests   Sit to Stand Details Verbal cues for precautions/safety;Verbal cues for safe use of DME/AE   Stand to Sit 5: Supervision;With upper extremity assist;To chair/3-in-1;With armrests   Stand to Sit Details (indicate cue type and reason) Verbal cues for precautions/safety;Verbal cues for safe use of DME/AE   Ambulation/Gait   Ambulation/Gait Yes   Ambulation/Gait Assistance 4: Min guard;4: Min assist   Ambulation/Gait Assistance Details occasional cues on posture, step length and walker position with gait   Ambulation Distance (Feet) 120 Feet  x1, 60 x2   Assistive device Prosthesis;Rollator   Gait Pattern Step-through pattern;Decreased step length - right;Decreased stance time - left;Decreased step length - left;Decreased stride length;Trunk flexed;Narrow base of support   Ambulation Surface Level;Indoor   Ramp Other (comment)  min guard assist   Ramp Details (indicate cue type and reason) x1 rep with cues on posture while using rollator/prosthesis   Curb 5: Supervision   Curb Details (indicate cue type and reason) x 1 rep with rollator/prosthesis, no assist needed. cues for stance position only with advancing rollator.   Prosthetics   Prosthetic Care Comments  Lift in right shoe has helped with her balance and made her feel more level with walking.    Current prosthetic wear tolerance (days/week)  daily   Current prosthetic wear tolerance (#hours/day)  wearing >12hrs of 15-16 awake hours with drying every 5 hours and prn   Residual limb condition  no skin issues per pt report, rash has cleared up.   Education Provided Residual limb care;Proper wear  schedule/adjustment;Proper weight-bearing schedule/adjustment   Person(s) Educated Patient   Education Method Explanation;Demonstration   Education Method Verbalized understanding   Donning Prosthesis Supervision   Doffing Prosthesis Supervision             PT Short Term Goals - 06/13/15 1654    PT SHORT TERM GOAL #1   Title Patient tolerates wear of prosthesis >12hrs/day without skin issues. (Target Date: 06/10/2015)   Status Achieved   PT SHORT TERM GOAL #2   Title Patient properly donnes prosthesis and verbalizes proper adjustment of wear schedule. (Target Date: 06/10/2015)   Status Achieved   PT SHORT TERM GOAL #3   Title Patient ambulates 100' with RW & prosthesis with supervision. (Target Date: 06/10/2015)   Status Achieved   PT SHORT TERM GOAL #4   Title Patient negotiates ramps & curbs with RW & prosthesis with minA. (Target Date: 06/10/2015)   Status Achieved   PT SHORT TERM GOAL #5   Title Patient performs standing  balance activities with RW support reaching 10" all directions, to floor and manages clothes safely. (Target Date: 06/10/2015)   Baseline Pt able to reach to floor and reach 10" across midline and anteriorly, unable to reach 10" laterally    Time 4   Period Weeks   Status Partially Met           PT Long Term Goals - 05/24/15 1354    PT LONG TERM GOAL #1   Title Patient tolerates wear of prosthesis >90% of awake hours without skin issues or limb tenderness. (Target Date: 07/08/2015)   Time 8   Period Weeks   Status On-going   PT LONG TERM GOAL #2   Title Patient verbalizes & demonstrates understanding of prosthetic care to enable safe use of prosthesis.  (Target Date: 07/08/2015)   Time 8   Period Weeks   Status On-going   PT LONG TERM GOAL #3   Title Berg Balance > 24/56 to indicate lower fall risk.  (Target Date: 07/08/2015)   Time 8   Period Weeks   Status On-going   PT LONG TERM GOAL #4   Title Timed Up & Go <20 seconds modified independent safely.   (Target Date: 07/08/2015)   Time 8   Period Weeks   Status On-going   PT LONG TERM GOAL #5   Title Patient ambulates 250' with LRAD & prosthesis modified independent to enable safe community mobility.  (Target Date: 07/08/2015)   Time 8   Period Weeks   Status On-going   PT LONG TERM GOAL #6   Title Patient negotiates ramps & curbs with LRAD & prosthesis modified indpendent to enable safe community mobility.  (Target Date: 07/08/2015)   Time 8   Period Weeks   Status On-going            Plan - 06/29/15 1409    Clinical Impression Statement Today's session continued to work on gait/barriers with prosthesis and rollator. Pt has already purchased a new one for use at home. Pt is making steady progress toward goals   Rehab Potential Good   PT Frequency 2x / week   PT Duration 8 weeks   PT Treatment/Interventions ADLs/Self Care Home Management;DME Instruction;Gait training;Stair training;Functional mobility training;Therapeutic activities;Therapeutic exercise;Balance training;Neuromuscular re-education;Patient/family education;Prosthetic Training;Vestibular   PT Next Visit Plan gait, ramp and curb with rollator, initiate balance activities   Consulted and Agree with Plan of Care Patient      Patient will benefit from skilled therapeutic intervention in order to improve the following deficits and impairments:  Abnormal gait, Decreased activity tolerance, Decreased balance, Decreased endurance, Decreased knowledge of use of DME, Decreased mobility, Impaired flexibility, Decreased strength, Postural dysfunction, Prosthetic Dependency, Pain  Visit Diagnosis: Other abnormalities of gait and mobility  Unsteadiness on feet  Muscle weakness (generalized)  Other symptoms and signs involving the musculoskeletal system  Pain in left lower leg     Problem List Patient Active Problem List   Diagnosis Date Noted  . Diastolic dysfunction 34/91/7915  . Diabetic neuropathy ([place]) 11/10/2014   . Chronic kidney disease (CKD), stage IV (severe) ([place]) 11/10/2014  . Fracture of distal femur ([place]) 07/08/2014  . Osteoporosis 07/08/2014  . Femur fracture, left ([place]) 07/08/2014  . Colles' fracture of left radius   . Left hip pain 05/03/2014  . Hip pain   . Perimenopausal symptoms 02/13/2012  . Hyponatremia 12/29/2011  . Asthma exacerbation attacks 12/28/2011  . Bronchitis 12/28/2011  . Repeated falls 07/30/2011  . Hip fracture,  left ([place]) 07/16/2011  . Femoral neck fracture ([place]) 07/16/2011  . Dyslipidemia 12/23/2008  . DIARRHEA 08/30/2008  . FATIGUE 07/12/2008  . THRUSH 05/20/2008  . Human immunodeficiency virus (HIV) disease ([place]) 04/19/2008  . Diabetes mellitus type 2 with complications, uncontrolled ([place]) 04/19/2008  . Mood disorder ([place]) 04/19/2008  . Hereditary and idiopathic peripheral neuropathy 04/19/2008  . Essential hypertension 04/19/2008    Willow Ora, PTA, [facility] [address], [place], [place] [address] [phone] 06/30/2015, 6:36 PM   Name: LATONIA CONROW MRN: [id number] Date of Birth: 1965-08-07

## 2015-07-06 ENCOUNTER — Ambulatory Visit: Payer: Commercial Managed Care - HMO | Attending: Orthopaedic Surgery | Admitting: Physical Therapy

## 2015-07-06 ENCOUNTER — Encounter: Payer: Self-pay | Admitting: Physical Therapy

## 2015-07-06 DIAGNOSIS — R2681 Unsteadiness on feet: Secondary | ICD-10-CM | POA: Diagnosis not present

## 2015-07-06 DIAGNOSIS — M6281 Muscle weakness (generalized): Secondary | ICD-10-CM | POA: Diagnosis not present

## 2015-07-06 DIAGNOSIS — R2689 Other abnormalities of gait and mobility: Secondary | ICD-10-CM | POA: Insufficient documentation

## 2015-07-06 DIAGNOSIS — Z79899 Other long term (current) drug therapy: Secondary | ICD-10-CM | POA: Diagnosis not present

## 2015-07-06 DIAGNOSIS — M79662 Pain in left lower leg: Secondary | ICD-10-CM | POA: Diagnosis not present

## 2015-07-06 DIAGNOSIS — R29898 Other symptoms and signs involving the musculoskeletal system: Secondary | ICD-10-CM | POA: Diagnosis not present

## 2015-07-06 DIAGNOSIS — Z5181 Encounter for therapeutic drug level monitoring: Secondary | ICD-10-CM | POA: Diagnosis not present

## 2015-07-07 ENCOUNTER — Ambulatory Visit: Payer: Commercial Managed Care - HMO | Admitting: Physical Therapy

## 2015-07-07 NOTE — Therapy (Signed)
[facility] [address] [place], [place], [address] Phone: [phone]   Fax:  [phone]  Physical Therapy Treatment  Patient Details  Name: [name] MRN: [id number] Date of Birth: 02-22-1965 Referring Provider: Frankey Shown, MD  Encounter Date: 07/06/2015      PT End of Session - 07/06/15 1320    Visit Number 13   Number of Visits 17   Date for PT Re-Evaluation 07/08/15   Authorization Type Medicare G-Code & progress report every 10 visits   PT Start Time 1230   PT Stop Time 1314   PT Time Calculation (min) 44 min   Equipment Utilized During Treatment Gait belt   Activity Tolerance Patient tolerated treatment well   Behavior During Therapy [facility] for tasks assessed/performed      Past Medical History  Diagnosis Date  . Anxiety   . Depression   . Hypertension   . HIV infection ([place]) 2000  . Asthma     HOSPITALIZED WITH EXCERBATION OF ASTHMA - AND BRONCHITIS AND THE FLU DEC 2013  . Shortness of breath     ALLERGIES ARE "ACTING UP"  . Diabetes mellitus     ON INSULIN AND ORAL MEDICATIONS  . GERD (gastroesophageal reflux disease)   . Neuropathy     NEUROPATHY HANDS AND FEET  . Arthritis     LEFT HIP  . Pain     SEVERE PAIN LEFT HIP - HX OF LEFT HIP PINNING JULY 2013    Past Surgical History  Procedure Laterality Date  . Hip pinning,cannulated  07/16/2011    Procedure: CANNULATED HIP PINNING;  Surgeon: Gearlean Alf, MD;  Location: [facility];  Service: Orthopedics;  Laterality: Left;  . Hip pinning,cannulated Left 04/09/2012    Procedure: CANNULATED HIP PINNING AND HARDWARE REVISION;  Surgeon: Gearlean Alf, MD;  Location: [facility];  Service: Orthopedics;  Laterality: Left;  . Hip arthroplasty Left 05/05/2014    Procedure: ARTHROPLASTY OPEN REDUCTION INTERNAL FIXATION LEFT HIP AND REMOVAL OF TWO CANNULATED SCREW;  Surgeon: Latanya Maudlin, MD;  Location: [facility];  Service: Orthopedics;  Laterality: Left;   . Cast application Left 06/09/4852    Procedure: CAST APPLICATION (FIBERGLASS);  Surgeon: Latanya Maudlin, MD;  Location: [facility];  Service: Orthopedics;  Laterality: Left;  CLOSED REDUCTION OF LEFT COLLES FRACTURE WITH SHORT ARM CAST  . Hardware removal Left 05/05/2014    Procedure: HARDWARE REMOVAL LEFT HIP;  Surgeon: Latanya Maudlin, MD;  Location: [facility];  Service: Orthopedics;  Laterality: Left;  REMOVAL BIOMET 6.5-8.0 CANNULATED SCREW  . Peripheral vascular catheterization N/A 11/15/2014    Procedure: Abdominal Aortogram;  Surgeon: Conrad Montezuma, MD;  Location: [facility];  Service: Cardiovascular;  Laterality: N/A;  . Peripheral vascular catheterization  11/15/2014    Procedure: Lower Extremity Angiography;  Surgeon: Conrad [place], MD;  Location: [facility];  Service: Cardiovascular;;  . Peripheral vascular catheterization Left 11/15/2014    Procedure: Peripheral Vascular Intervention;  Surgeon: Conrad Pachuta, MD;  Location: [facility];  Service: Cardiovascular;  Laterality: Left;  popliteal artery stenting  . I&d extremity Left 11/16/2014    Procedure:  DEBRIDEMENT OF LEFT FOOT POSSIBLE APPLICATION OF INTEGRIA AND VAC ;  Surgeon: Irene Limbo, MD;  Location: [facility];  Service: Plastics;  Laterality: Left;  . Amputation Left 11/18/2014    Procedure: LEFT BELOW KNEE AMPUTATION;  Surgeon: Leandrew Koyanagi, MD;  Location: [facility];  Service: Orthopedics;  Laterality: Left;    There were no vitals filed for this visit.      Subjective Assessment - 07/06/15 1239    Subjective Patient reports [employer] will reimburse her for rollator walker. She feels she needs additional PT as she is still not functioning at prior level and is requesting PT recertify her.    Patient is accompained by: Family member   Pertinent History DM2, neuropathy, HIV, mood disorder, HTN, CKD (stage IV), asthma, arthritis, left hip fracture with ORIF 07/16/2011 and hardware removal with new pinning 05/05/2014    Limitations Lifting;Standing;Walking   Patient Stated Goals to use prosthesis to go in community including shopping, church, [place] meetings   Currently in Pain? Yes   Pain Score 3    Pain Location Knee   Pain Orientation Medial   Pain Descriptors / Indicators Burning;Sore;Other (Comment)  itching   Pain Type Acute pain   Pain Onset In the past 7 days   Pain Frequency Intermittent   Aggravating Factors  prosthesis pressure   Pain Relieving Factors removing prosthesis or sitting to decrease pressure            [facility] PT Assessment - 07/06/15 1235    Berg Balance Test   Sit to Stand Able to stand  independently using hands   Standing Unsupported Able to stand safely 2 minutes   Sitting with Back Unsupported but Feet Supported on Floor or Stool Able to sit safely and securely 2 minutes   Stand to Sit Controls descent by using hands   Transfers Able to transfer safely, definite need of hands   Standing Unsupported with Eyes Closed Able to stand 3 seconds   Standing Ubsupported with Feet Together Needs help to attain position but able to stand for 30 seconds with feet together   From Standing, Reach Forward with Outstretched Arm Reaches forward but needs supervision   From Standing Position, Pick up Object from Floor Unable to try/needs assist to keep balance   From Standing Position, Turn to Look Behind Over each Shoulder Needs supervision when turning   Turn 360 Degrees Needs assistance while turning   Standing Unsupported, Alternately Place Feet on Step/Stool Needs assistance to keep from falling or unable to try   Standing Unsupported, One Foot in Front Needs help to step but can hold 15 seconds   Standing on One Leg Unable to try or needs assist to prevent fall   Total Score 23   Berg comment: Initial was 10/56                     [facility] Adult PT Treatment/Exercise - 07/06/15 1235    Transfers   Transfers Sit to Stand;Stand to Sit   Sit to Stand 5: Supervision;With  upper extremity assist;From chair/3-in-1;With armrests  needs supervision to stabilize   Sit to Stand Details Verbal cues for precautions/safety;Verbal cues for safe use of DME/AE   Stand to Sit 5: Supervision;With upper extremity assist;To chair/3-in-1;With armrests   Stand to Sit Details (indicate cue type and reason) Verbal cues for precautions/safety;Verbal cues for safe use of DME/AE   Ambulation/Gait   Ambulation/Gait Yes   Ambulation/Gait Assistance 4: Min guard   Ambulation/Gait Assistance Details cues on posture and weight shift, patient has lateral lean on pylon creating varus knee moment   Ambulation Distance (Feet) 160 Feet  160' X 1 & 100' X 1   Assistive device Prosthesis;Rollator   Gait Pattern Step-through pattern;Decreased step length - right;Decreased stance time -  left;Decreased step length - left;Decreased stride length;Trunk flexed;Narrow base of support   Ambulation Surface Indoor;Level   Ramp --   Curb --   Prosthetics   Prosthetic Care Comments  use of cut-off socks to manage knee pressure, use of anti-perspirant,    Current prosthetic wear tolerance (days/week)  daily   Current prosthetic wear tolerance (#hours/day)  wearing >12hrs of 15-16 awake hours with drying every 5 hours and prn   Residual limb condition  redness & rash over medial knee proximally under liner with superficial skin breakdown   Education Provided Residual limb care;Proper wear schedule/adjustment;Correct ply sock adjustment   Person(s) Educated Patient   Education Method Explanation;Demonstration;Tactile cues;Verbal cues   Education Method Verbalized understanding;Returned demonstration;Tactile cues required;Verbal cues required;Needs further instruction                  PT Short Term Goals - 06/13/15 1654    PT SHORT TERM GOAL #1   Title Patient tolerates wear of prosthesis >12hrs/day without skin issues. (Target Date: 06/10/2015)   Status Achieved   PT SHORT TERM GOAL #2    Title Patient properly donnes prosthesis and verbalizes proper adjustment of wear schedule. (Target Date: 06/10/2015)   Status Achieved   PT SHORT TERM GOAL #3   Title Patient ambulates 100' with RW & prosthesis with supervision. (Target Date: 06/10/2015)   Status Achieved   PT SHORT TERM GOAL #4   Title Patient negotiates ramps & curbs with RW & prosthesis with minA. (Target Date: 06/10/2015)   Status Achieved   PT SHORT TERM GOAL #5   Title Patient performs standing balance activities with RW support reaching 10" all directions, to floor and manages clothes safely. (Target Date: 06/10/2015)   Baseline Pt able to reach to floor and reach 10" across midline and anteriorly, unable to reach 10" laterally    Time 4   Period Weeks   Status Partially Met           PT Long Term Goals - 07/06/15 1239    PT LONG TERM GOAL #1   Title Patient tolerates wear of prosthesis >90% of awake hours without skin issues or limb tenderness. (Target Date: 07/08/2015)   Time 8   Period Weeks   Status On-going   PT LONG TERM GOAL #2   Title Patient verbalizes & demonstrates understanding of prosthetic care to enable safe use of prosthesis.  (Target Date: 07/08/2015)   Time 8   Period Weeks   Status On-going   PT LONG TERM GOAL #3   Title Berg Balance > 24/56 to indicate lower fall risk.  (Target Date: 07/08/2015)   Time 8   Period Weeks   Status On-going   PT LONG TERM GOAL #4   Title Timed Up & Go <20 seconds modified independent safely.  (Target Date: 07/08/2015)   Time 8   Period Weeks   Status On-going   PT LONG TERM GOAL #5   Title Patient ambulates 250' with LRAD & prosthesis modified independent to enable safe community mobility.  (Target Date: 07/08/2015)   Time 8   Period Weeks   Status On-going   PT LONG TERM GOAL #6   Title Patient negotiates ramps & curbs with LRAD & prosthesis modified indpendent to enable safe community mobility.  (Target Date: 07/08/2015)   Time 8   Period Weeks   Status  On-going               Plan - 07/06/15 1315  Clinical Impression Statement Patient appears to making progress with mobility with her prosthesis but needs additional PT to meet her potential.    Rehab Potential Good   PT Frequency 2x / week   PT Duration 8 weeks   PT Treatment/Interventions ADLs/Self Care Home Management;DME Instruction;Gait training;Stair training;Functional mobility training;Therapeutic activities;Therapeutic exercise;Balance training;Neuromuscular re-education;Patient/family education;Prosthetic Training;Vestibular   PT Next Visit Plan assess LTGs and recertify for additional PT.    Consulted and Agree with Plan of Care Patient      Patient will benefit from skilled therapeutic intervention in order to improve the following deficits and impairments:  Abnormal gait, Decreased activity tolerance, Decreased balance, Decreased endurance, Decreased knowledge of use of DME, Decreased mobility, Impaired flexibility, Decreased strength, Postural dysfunction, Prosthetic Dependency, Pain  Visit Diagnosis: Other abnormalities of gait and mobility  Unsteadiness on feet  Muscle weakness (generalized)  Other symptoms and signs involving the musculoskeletal system  Pain in left lower leg     Problem List Patient Active Problem List   Diagnosis Date Noted  . Diastolic dysfunction 26/33/3545  . Diabetic neuropathy ([place]) 11/10/2014  . Chronic kidney disease (CKD), stage IV (severe) ([place]) 11/10/2014  . Fracture of distal femur ([place]) 07/08/2014  . Osteoporosis 07/08/2014  . Femur fracture, left ([place]) 07/08/2014  . Colles' fracture of left radius   . Left hip pain 05/03/2014  . Hip pain   . Perimenopausal symptoms 02/13/2012  . Hyponatremia 12/29/2011  . Asthma exacerbation attacks 12/28/2011  . Bronchitis 12/28/2011  . Repeated falls 07/30/2011  . Hip fracture, left ([place]) 07/16/2011  . Femoral neck fracture ([place]) 07/16/2011  . Dyslipidemia 12/23/2008  .  DIARRHEA 08/30/2008  . FATIGUE 07/12/2008  . THRUSH 05/20/2008  . Human immunodeficiency virus (HIV) disease ([place]) 04/19/2008  . Diabetes mellitus type 2 with complications, uncontrolled ([place]) 04/19/2008  . Mood disorder ([place]) 04/19/2008  . Hereditary and idiopathic peripheral neuropathy 04/19/2008  . Essential hypertension 04/19/2008    Jamey Reas PT, DPT 07/07/2015, 7:53 AM  [facility] [address] [place], [place], [address] Phone: [phone]   Fax:  [phone]  Name: [name] MRN: [id number] Date of Birth: July 02, 1965

## 2015-07-11 ENCOUNTER — Encounter: Payer: Self-pay | Admitting: Physical Therapy

## 2015-07-11 ENCOUNTER — Ambulatory Visit: Payer: Commercial Managed Care - HMO | Admitting: Physical Therapy

## 2015-07-11 DIAGNOSIS — R29898 Other symptoms and signs involving the musculoskeletal system: Secondary | ICD-10-CM

## 2015-07-11 DIAGNOSIS — R2681 Unsteadiness on feet: Secondary | ICD-10-CM | POA: Diagnosis not present

## 2015-07-11 DIAGNOSIS — R2689 Other abnormalities of gait and mobility: Secondary | ICD-10-CM

## 2015-07-11 DIAGNOSIS — M79662 Pain in left lower leg: Secondary | ICD-10-CM

## 2015-07-11 DIAGNOSIS — M6281 Muscle weakness (generalized): Secondary | ICD-10-CM

## 2015-07-12 NOTE — Therapy (Signed)
[facility] [address] [place], [place], [address] Phone: [phone]   Fax:  [phone]  Physical Therapy Treatment  Patient Details  Name: [name] MRN: [id number] Date of Birth: 1965/02/29 Referring Provider: Frankey Shown, MD  Encounter Date: 07/11/2015      PT End of Session - 07/11/15 1455    Visit Number 14   Number of Visits 17   Date for PT Re-Evaluation 07/08/15   Authorization Type Medicare G-Code & progress report every 10 visits   PT Start Time 9169   PT Stop Time 1530   PT Time Calculation (min) 43 min   Equipment Utilized During Treatment Gait belt   Activity Tolerance Patient tolerated treatment well   Behavior During Therapy [facility] for tasks assessed/performed      Past Medical History  Diagnosis Date  . Anxiety   . Depression   . Hypertension   . HIV infection ([place]) 2000  . Asthma     HOSPITALIZED WITH EXCERBATION OF ASTHMA - AND BRONCHITIS AND THE FLU DEC 2013  . Shortness of breath     ALLERGIES ARE "ACTING UP"  . Diabetes mellitus     ON INSULIN AND ORAL MEDICATIONS  . GERD (gastroesophageal reflux disease)   . Neuropathy     NEUROPATHY HANDS AND FEET  . Arthritis     LEFT HIP  . Pain     SEVERE PAIN LEFT HIP - HX OF LEFT HIP PINNING JULY 2013    Past Surgical History  Procedure Laterality Date  . Hip pinning,cannulated  07/16/2011    Procedure: CANNULATED HIP PINNING;  Surgeon: Gearlean Alf, MD;  [facility];  Service: Orthopedics;  Laterality: Left;  . Hip pinning,cannulated Left 04/09/2012    Procedure: CANNULATED HIP PINNING AND HARDWARE REVISION;  Surgeon: Gearlean Alf, MD;  [facility];  Service: Orthopedics;  Laterality: Left;  . Hip arthroplasty Left 05/05/2014    Procedure: ARTHROPLASTY OPEN REDUCTION INTERNAL FIXATION LEFT HIP AND REMOVAL OF TWO CANNULATED SCREW;  Surgeon: Latanya Maudlin, MD;  [facility];  Service: Orthopedics;  Laterality: Left;   . Cast application Left 04/06/386    Procedure: CAST APPLICATION (FIBERGLASS);  Surgeon: Latanya Maudlin, MD;  [facility];  Service: Orthopedics;  Laterality: Left;  CLOSED REDUCTION OF LEFT COLLES FRACTURE WITH SHORT ARM CAST  . Hardware removal Left 05/05/2014    Procedure: HARDWARE REMOVAL LEFT HIP;  Surgeon: Latanya Maudlin, MD;  [facility];  Service: Orthopedics;  Laterality: Left;  REMOVAL BIOMET 6.5-8.0 CANNULATED SCREW  . Peripheral vascular catheterization N/A 11/15/2014    Procedure: Abdominal Aortogram;  Surgeon: Conrad Zimmerman, MD;  Location: [facility];  Service: Cardiovascular;  Laterality: N/A;  . Peripheral vascular catheterization  11/15/2014    Procedure: Lower Extremity Angiography;  Surgeon: Conrad Chase, MD;  Location: [facility];  Service: Cardiovascular;;  . Peripheral vascular catheterization Left 11/15/2014    Procedure: Peripheral Vascular Intervention;  Surgeon: Conrad Danbury, MD;  Location: [facility];  Service: Cardiovascular;  Laterality: Left;  popliteal artery stenting  . I&d extremity Left 11/16/2014    Procedure:  DEBRIDEMENT OF LEFT FOOT POSSIBLE APPLICATION OF INTEGRIA AND VAC ;  Surgeon: Irene Limbo, MD;  Location: [facility];  Service: Plastics;  Laterality: Left;  . Amputation Left 11/18/2014    Procedure: LEFT BELOW KNEE AMPUTATION;  Surgeon: Leandrew Koyanagi, MD;  Location: [facility];  Service: Orthopedics;  Laterality: Left;    There were no vitals filed for this visit.      Subjective Assessment - 07/11/15 1452    Subjective Having issues with prosthesis fitting, it's leaning out causing increased pain at knee. See's Fritz Pickerel for [employer]. No falls to report. Has been working with rollator at home, limited by pain.   Patient is accompained by: Family member   Pertinent History DM2, neuropathy, HIV, mood disorder, HTN, CKD (stage IV), asthma, arthritis, left hip fracture with ORIF 07/16/2011 and hardware removal with new  pinning 05/05/2014   Patient Stated Goals to use prosthesis to go in community including shopping, church, [place] meetings   Currently in Pain? Yes   Pain Score 6    Pain Location Knee   Pain Orientation Medial   Pain Descriptors / Indicators Aching;Sore   Pain Type Acute pain   Pain Onset 1 to 4 weeks ago   Pain Frequency Intermittent   Aggravating Factors  prosthesis pressure   Pain Relieving Factors removing prosthesis or sitting to decrease pressure            [facility] PT Assessment - 07/11/15 1457    Berg Balance Test   Sit to Stand Able to stand  independently using hands   Standing Unsupported Able to stand safely 2 minutes   Sitting with Back Unsupported but Feet Supported on Floor or Stool Able to sit safely and securely 2 minutes   Stand to Sit Controls descent by using hands   Transfers Able to transfer safely, definite need of hands   Standing Unsupported with Eyes Closed Able to stand 10 seconds with supervision   Standing Ubsupported with Feet Together Able to place feet together independently and stand for 1 minute with supervision   From Standing, Reach Forward with Outstretched Arm Can reach forward >12 cm safely (5")  7 inches   From Standing Position, Pick up Object from Floor Able to pick up shoe, needs supervision   From Standing Position, Turn to Look Behind Over each Shoulder Turn sideways only but maintains balance  right > left   Turn 360 Degrees Needs assistance while turning   Standing Unsupported, Alternately Place Feet on Step/Stool Needs assistance to keep from falling or unable to try   Standing Unsupported, One Foot in [place] to take small step independently and hold 30 seconds   Standing on One Leg Unable to try or needs assist to prevent fall   Total Score 33   Berg comment: Initial was 10/56, 07/06/15 was 23/56            [facility] Adult PT Treatment/Exercise - 07/11/15 1457    Transfers   Transfers Sit to Stand;Stand to Sit   Sit to Stand 6:  Modified independent (Device/Increase time);With upper extremity assist;With armrests;From chair/3-in-1   Stand to Sit 6: Modified independent (Device/Increase time);With upper extremity assist;With armrests;To chair/3-in-1   Ambulation/Gait   Ambulation/Gait Yes   Ambulation/Gait Assistance 4: Min guard;5: Supervision   Ambulation/Gait Assistance Details cues on posture and rollator position with gait   Ambulation Distance (Feet) 120 Feet   Assistive device Prosthesis;Rollator   Gait Pattern Step-through pattern;Decreased step length - right;Decreased stance time - left;Decreased step length - left;Decreased stride length;Trunk flexed;Narrow base of support   Ambulation Surface Level;Indoor   Timed Up and Go Test   TUG Normal TUG   Normal TUG (seconds) 31.43  with rollator   Prosthetics   Prosthetic Care Comments  use of anti-perspirant has  cleared up rash.   Current prosthetic wear tolerance (days/week)  daily   Current prosthetic wear tolerance (#hours/day)  wearing >12hrs of 15-16 awake hours with drying every 5 hours and prn   Residual limb condition  intact, rash is gone.   Education Provided Proper weight-bearing schedule/adjustment;Residual limb care   Person(s) Educated Patient   Education Method Explanation;Verbal cues   Education Method Verbalized understanding   Donning Prosthesis Supervision   Doffing Prosthesis Supervision           PT Short Term Goals - 06/13/15 1654    PT SHORT TERM GOAL #1   Title Patient tolerates wear of prosthesis >12hrs/day without skin issues. (Target Date: 06/10/2015)   Status Achieved   PT SHORT TERM GOAL #2   Title Patient properly donnes prosthesis and verbalizes proper adjustment of wear schedule. (Target Date: 06/10/2015)   Status Achieved   PT SHORT TERM GOAL #3   Title Patient ambulates 100' with RW & prosthesis with supervision. (Target Date: 06/10/2015)   Status Achieved   PT SHORT TERM GOAL #4   Title Patient negotiates ramps &  curbs with RW & prosthesis with minA. (Target Date: 06/10/2015)   Status Achieved   PT SHORT TERM GOAL #5   Title Patient performs standing balance activities with RW support reaching 10" all directions, to floor and manages clothes safely. (Target Date: 06/10/2015)   Baseline Pt able to reach to floor and reach 10" across midline and anteriorly, unable to reach 10" laterally    Time 4   Period Weeks   Status Partially Met           PT Long Term Goals - 07/11/15 1456    PT LONG TERM GOAL #1   Title Patient tolerates wear of prosthesis >90% of awake hours without skin issues or limb tenderness. (Target Date: 07/08/2015)   Baseline 07/11/15: pt is wearing the prosthesis all awake hours, however does still have tenderness/pain with prosthesis wear   Time 8   Period Weeks   Status Partially Met   PT LONG TERM GOAL #2   Title Patient verbalizes & demonstrates understanding of prosthetic care to enable safe use of prosthesis.  (Target Date: 07/08/2015)   Baseline 07/11/15: pt still needs cues on sock management at times   Time --   Period --   Status Partially Met   PT LONG TERM GOAL #3   Title Berg Balance > 24/56 to indicate lower fall risk.  (Target Date: 07/08/2015)   Baseline 07/11/15: scored 33/56   Time --   Period --   Status Achieved   PT LONG TERM GOAL #4   Title Timed Up & Go <20 seconds modified independent safely.  (Target Date: 07/08/2015)   Baseline 07/11/15: 31.43 sec's with rollator   Time --   Period --   Status Not Met   PT LONG TERM GOAL #5   Title Patient ambulates 250' with LRAD & prosthesis modified independent to enable safe community mobility.  (Target Date: 07/08/2015)   Baseline 07/11/15: pt has come close to the distance with 220 feet acheived with supervision using rollator    Time --   Period --   Status Not Met   PT LONG TERM GOAL #6   Title Patient negotiates ramps & curbs with LRAD & prosthesis modified indpendent to enable safe community mobility.  (Target Date:  07/08/2015)   Baseline 07/11/15: pt needed supervision with rollator with previous session    Time --  Period --   Status Not Met            Plan - 07/11/15 1456    Clinical Impression Statement Pt has met 1/6 LTGs and partiallly met 2/6 LTGs. Pt is progressing toward all unmet goals. Primary PT plans to renew to continue progressing toward all unmet goals.   Rehab Potential Good   PT Frequency 2x / week   PT Duration 8 weeks   PT Treatment/Interventions ADLs/Self Care Home Management;DME Instruction;Gait training;Stair training;Functional mobility training;Therapeutic activities;Therapeutic exercise;Balance training;Neuromuscular re-education;Patient/family education;Prosthetic Training;Vestibular   PT Next Visit Plan PT to recert; continue with gait with rollator/prosthesis, work on both static and dynamic standing balance activities   Consulted and Agree with Plan of Care Patient      Patient will benefit from skilled therapeutic intervention in order to improve the following deficits and impairments:  Abnormal gait, Decreased activity tolerance, Decreased balance, Decreased endurance, Decreased knowledge of use of DME, Decreased mobility, Impaired flexibility, Decreased strength, Postural dysfunction, Prosthetic Dependency, Pain  Visit Diagnosis: Other abnormalities of gait and mobility  Unsteadiness on feet  Muscle weakness (generalized)  Other symptoms and signs involving the musculoskeletal system  Pain in left lower leg     Problem List Patient Active Problem List   Diagnosis Date Noted  . Diastolic dysfunction 05/11/209  . Diabetic neuropathy ([place]) 11/10/2014  . Chronic kidney disease (CKD), stage IV (severe) ([place]) 11/10/2014  . Fracture of distal femur ([place]) 07/08/2014  . Osteoporosis 07/08/2014  . Femur fracture, left ([place]) 07/08/2014  . Colles' fracture of left radius   . Left hip pain 05/03/2014  . Hip pain   . Perimenopausal symptoms 02/13/2012  .  Hyponatremia 12/29/2011  . Asthma exacerbation attacks 12/28/2011  . Bronchitis 12/28/2011  . Repeated falls 07/30/2011  . Hip fracture, left ([place]) 07/16/2011  . Femoral neck fracture ([place]) 07/16/2011  . Dyslipidemia 12/23/2008  . DIARRHEA 08/30/2008  . FATIGUE 07/12/2008  . THRUSH 05/20/2008  . Human immunodeficiency virus (HIV) disease ([place]) 04/19/2008  . Diabetes mellitus type 2 with complications, uncontrolled ([place]) 04/19/2008  . Mood disorder ([place]) 04/19/2008  . Hereditary and idiopathic peripheral neuropathy 04/19/2008  . Essential hypertension 04/19/2008    Willow Ora, PTA, [facility] [address] [place], [place] [address] [phone] 07/12/2015, 7:39 PM   Name: [name] MRN: [id number] Date of Birth: October 25, 1965

## 2015-07-13 ENCOUNTER — Ambulatory Visit: Payer: Commercial Managed Care - HMO | Admitting: Physical Therapy

## 2015-07-13 ENCOUNTER — Encounter: Payer: Self-pay | Admitting: Physical Therapy

## 2015-07-13 DIAGNOSIS — M79662 Pain in left lower leg: Secondary | ICD-10-CM | POA: Diagnosis not present

## 2015-07-13 DIAGNOSIS — R2689 Other abnormalities of gait and mobility: Secondary | ICD-10-CM

## 2015-07-13 DIAGNOSIS — R29898 Other symptoms and signs involving the musculoskeletal system: Secondary | ICD-10-CM

## 2015-07-13 DIAGNOSIS — R2681 Unsteadiness on feet: Secondary | ICD-10-CM

## 2015-07-13 DIAGNOSIS — M6281 Muscle weakness (generalized): Secondary | ICD-10-CM

## 2015-07-13 NOTE — Therapy (Signed)
[facility] [address] [place], [place], [address] Phone: [phone]   Fax:  [phone]  Physical Therapy Treatment  Patient Details  Name: [name] MRN: [id number] Date of Birth: July 17, 1965 Referring Provider: Frankey Shown, MD  Encounter Date: 07/13/2015      PT End of Session - 07/13/15 1542    PT Start Time 3662   PT Stop Time 9476  Ended session due to patient experience SOB due to mild asthmatic symptoms   PT Time Calculation (min) 30 min   Equipment Utilized During Treatment Gait belt   Activity Tolerance Treatment limited secondary to medical complications (Comment)   Behavior During Therapy [facility] for tasks assessed/performed      Past Medical History  Diagnosis Date  . Anxiety   . Depression   . Hypertension   . HIV infection ([place]) 2000  . Asthma     HOSPITALIZED WITH EXCERBATION OF ASTHMA - AND BRONCHITIS AND THE FLU DEC 2013  . Shortness of breath     ALLERGIES ARE "ACTING UP"  . Diabetes mellitus     ON INSULIN AND ORAL MEDICATIONS  . GERD (gastroesophageal reflux disease)   . Neuropathy     NEUROPATHY HANDS AND FEET  . Arthritis     LEFT HIP  . Pain     SEVERE PAIN LEFT HIP - HX OF LEFT HIP PINNING JULY 2013    Past Surgical History  Procedure Laterality Date  . Hip pinning,cannulated  07/16/2011    Procedure: CANNULATED HIP PINNING;  Surgeon: Gearlean Alf, MD;  [facility];  Service: Orthopedics;  Laterality: Left;  . Hip pinning,cannulated Left 04/09/2012    Procedure: CANNULATED HIP PINNING AND HARDWARE REVISION;  Surgeon: Gearlean Alf, MD;  [facility];  Service: Orthopedics;  Laterality: Left;  . Hip arthroplasty Left 05/05/2014    Procedure: ARTHROPLASTY OPEN REDUCTION INTERNAL FIXATION LEFT HIP AND REMOVAL OF TWO CANNULATED SCREW;  Surgeon: Latanya Maudlin, MD;  [facility];  Service: Orthopedics;  Laterality: Left;  . Cast application Left 05/04/6501     Procedure: CAST APPLICATION (FIBERGLASS);  Surgeon: Latanya Maudlin, MD;  [facility];  Service: Orthopedics;  Laterality: Left;  CLOSED REDUCTION OF LEFT COLLES FRACTURE WITH SHORT ARM CAST  . Hardware removal Left 05/05/2014    Procedure: HARDWARE REMOVAL LEFT HIP;  Surgeon: Latanya Maudlin, MD;  [facility];  Service: Orthopedics;  Laterality: Left;  REMOVAL BIOMET 6.5-8.0 CANNULATED SCREW  . Peripheral vascular catheterization N/A 11/15/2014    Procedure: Abdominal Aortogram;  Surgeon: Conrad [place], MD;  Location: [facility];  Service: Cardiovascular;  Laterality: N/A;  . Peripheral vascular catheterization  11/15/2014    Procedure: Lower Extremity Angiography;  Surgeon: Conrad [place], MD;  Location: [facility];  Service: Cardiovascular;;  . Peripheral vascular catheterization Left 11/15/2014    Procedure: Peripheral Vascular Intervention;  Surgeon: Conrad [place], MD;  Location: [facility];  Service: Cardiovascular;  Laterality: Left;  popliteal artery stenting  . I&d extremity Left 11/16/2014    Procedure:  DEBRIDEMENT OF LEFT FOOT POSSIBLE APPLICATION OF INTEGRIA AND VAC ;  Surgeon: Irene Limbo, MD;  Location: [facility];  Service: Plastics;  Laterality: Left;  . Amputation Left 11/18/2014    Procedure: LEFT BELOW KNEE AMPUTATION;  Surgeon: Leandrew Koyanagi, MD;  Location: [facility];  Service: Orthopedics;  Laterality: Left;    There were no vitals filed for this visit.  Subjective Assessment - 07/13/15 1519    Subjective No new complaints. No falls. Still using rollator at home and adjusted the height of it so it's doing better now.   Patient is accompained by: Family member   Pertinent History DM2, neuropathy, HIV, mood disorder, HTN, CKD (stage IV), asthma, arthritis, left hip fracture with ORIF 07/16/2011 and hardware removal with new pinning 05/05/2014   Limitations Lifting;Standing;Walking   Patient Stated Goals to use prosthesis to go in community including  shopping, church, [place] meetings   Currently in Pain? Yes   Pain Score 3    Pain Location Back   Pain Orientation Lower   Pain Descriptors / Indicators Aching   Pain Type Chronic pain   Pain Score 0   Pain Location Knee           [facility] Adult PT Treatment/Exercise - 07/13/15 1446    Transfers   Transfers Sit to Stand;Stand to Sit   Sit to Stand 6: Modified independent (Device/Increase time);With upper extremity assist;With armrests;From chair/3-in-1   Sit to Stand Details Verbal cues for technique   Stand to Sit 6: Modified independent (Device/Increase time);With upper extremity assist;With armrests;To chair/3-in-1   Stand to Sit Details (indicate cue type and reason) Verbal cues for technique   Ambulation/Gait   Ambulation/Gait Yes   Ambulation/Gait Assistance 4: Min guard;5: Supervision   Ambulation/Gait Assistance Details VC for upright posture, no LOB   Ambulation Distance (Feet) 125 Feet  x1 (78' x1, 47'x1, 58'x1, 67'x1 all on level floor)   Assistive device Prosthesis;Rollator   Gait Pattern Step-through pattern;Decreased step length - right;Decreased stance time - left;Decreased step length - left;Decreased stride length;Trunk flexed;Narrow base of support   Ambulation Surface Level;Indoor           PT Education - 07/13/15 1541    Education provided Yes   Education Details Educated on residual limb care/prosthesis management   Person(s) Educated Patient   Methods Explanation   Comprehension Verbalized understanding          PT Short Term Goals - 06/13/15 1654    PT SHORT TERM GOAL #1   Title Patient tolerates wear of prosthesis >12hrs/day without skin issues. (Target Date: 06/10/2015)   Status Achieved   PT SHORT TERM GOAL #2   Title Patient properly donnes prosthesis and verbalizes proper adjustment of wear schedule. (Target Date: 06/10/2015)   Status Achieved   PT SHORT TERM GOAL #3   Title Patient ambulates 100' with RW & prosthesis with supervision. (Target  Date: 06/10/2015)   Status Achieved   PT SHORT TERM GOAL #4   Title Patient negotiates ramps & curbs with RW & prosthesis with minA. (Target Date: 06/10/2015)   Status Achieved   PT SHORT TERM GOAL #5   Title Patient performs standing balance activities with RW support reaching 10" all directions, to floor and manages clothes safely. (Target Date: 06/10/2015)   Baseline Pt able to reach to floor and reach 10" across midline and anteriorly, unable to reach 10" laterally    Time 4   Period Weeks   Status Partially Met           PT Long Term Goals - 07/11/15 1456    PT LONG TERM GOAL #1   Title Patient tolerates wear of prosthesis >90% of awake hours without skin issues or limb tenderness. (Target Date: 07/08/2015)   Baseline 07/11/15: pt is wearing the prosthesis all awake hours, however does still have tenderness/pain with prosthesis wear  Time 8   Period Weeks   Status Partially Met   PT LONG TERM GOAL #2   Title Patient verbalizes & demonstrates understanding of prosthetic care to enable safe use of prosthesis.  (Target Date: 07/08/2015)   Baseline 07/11/15: pt still needs cues on sock management at times   Time --   Period --   Status Partially Met   PT LONG TERM GOAL #3   Title Berg Balance > 24/56 to indicate lower fall risk.  (Target Date: 07/08/2015)   Baseline 07/11/15: scored 33/56   Time --   Period --   Status Achieved   PT LONG TERM GOAL #4   Title Timed Up & Go <20 seconds modified independent safely.  (Target Date: 07/08/2015)   Baseline 07/11/15: 31.43 sec's with rollator   Time --   Period --   Status Not Met   PT LONG TERM GOAL #5   Title Patient ambulates 250' with LRAD & prosthesis modified independent to enable safe community mobility.  (Target Date: 07/08/2015)   Baseline 07/11/15: pt has come close to the distance with 220 feet acheived with supervision using rollator    Time --   Period --   Status Not Met   PT LONG TERM GOAL #6   Title Patient negotiates ramps &  curbs with LRAD & prosthesis modified indpendent to enable safe community mobility.  (Target Date: 07/08/2015)   Baseline 07/11/15: pt needed supervision with rollator with previous session    Time --   Period --   Status Not Met            Plan - 07/13/15 1544    Clinical Impression Statement Pt ambulated for 125' indoors on blue track at one time, followed by several shorter distances with rest breaks in between. She experienced some shortness of breath and fatigue, and she chose to terminate session. She should continue to benefit from skilled PT to address ongoing goals.   Rehab Potential Good   PT Frequency 2x / week   PT Duration 8 weeks   PT Treatment/Interventions ADLs/Self Care Home Management;DME Instruction;Gait training;Stair training;Functional mobility training;Therapeutic activities;Therapeutic exercise;Balance training;Neuromuscular re-education;Patient/family education;Prosthetic Training;Vestibular   PT Next Visit Plan Continue with gait with rollator/prosthesis, work on both static and dynamic standing balance activities   Consulted and Agree with Plan of Care Patient      Patient will benefit from skilled therapeutic intervention in order to improve the following deficits and impairments:  Abnormal gait, Decreased activity tolerance, Decreased balance, Decreased endurance, Decreased knowledge of use of DME, Decreased mobility, Impaired flexibility, Decreased strength, Postural dysfunction, Prosthetic Dependency, Pain  Visit Diagnosis: Other abnormalities of gait and mobility  Unsteadiness on feet  Muscle weakness (generalized)  Other symptoms and signs involving the musculoskeletal system  Pain in left lower leg     Problem List Patient Active Problem List   Diagnosis Date Noted  . Diastolic dysfunction 48/01/6551  . Diabetic neuropathy ([place]) 11/10/2014  . Chronic kidney disease (CKD), stage IV (severe) ([place]) 11/10/2014  . Fracture of distal femur ([place])  07/08/2014  . Osteoporosis 07/08/2014  . Femur fracture, left ([place]) 07/08/2014  . Colles' fracture of left radius   . Left hip pain 05/03/2014  . Hip pain   . Perimenopausal symptoms 02/13/2012  . Hyponatremia 12/29/2011  . Asthma exacerbation attacks 12/28/2011  . Bronchitis 12/28/2011  . Repeated falls 07/30/2011  . Hip fracture, left ([place]) 07/16/2011  . Femoral neck fracture ([place]) 07/16/2011  . Dyslipidemia  12/23/2008  . DIARRHEA 08/30/2008  . FATIGUE 07/12/2008  . THRUSH 05/20/2008  . Human immunodeficiency virus (HIV) disease ([place]) 04/19/2008  . Diabetes mellitus type 2 with complications, uncontrolled ([place]) 04/19/2008  . Mood disorder ([place]) 04/19/2008  . Hereditary and idiopathic peripheral neuropathy 04/19/2008  . Essential hypertension 04/19/2008    Trixie Deis, [place] 07/13/2015, 3:49 PM  [facility] [address] [place], [place], [address] Phone: [phone]   Fax:  [phone]  Name: TEARSA KOWALEWSKI MRN: [id number] Date of Birth: February 19, 1965  This note has been reviewed and edited by supervising CI.  Willow Ora, PTA, [facility] [address] [place], [place] [address] [phone] 07/15/2015, 9:20 AM

## 2015-07-19 ENCOUNTER — Ambulatory Visit: Payer: Commercial Managed Care - HMO | Admitting: Physical Therapy

## 2015-07-19 NOTE — Therapy (Signed)
[facility] [address] [place], [place], [address] Phone: [phone]   Fax:  [phone]  Patient Details  Name: [name] MRN: [id number] Date of Birth: 12-13-1965 Referring Provider:  Hilbert Bible  Encounter Date: 07/19/2015  Appointment at 15:30 Patient arrived for PT session. She was sweaty and with decreased alertness ("feel drunk but haven't drank anything").  Patient reports ~11:00 this morning her blood sugar was 180 and she took her insulin without eating.  PT gave her Coke & PB crackers. PT advised to eat some protein also so going to get "Chicken McNuggets" PT held session as she appears to have low blood sugar and participation in gait & balance activities did not appear safe.    Jamey Reas PT, DPT 07/19/2015, 3:59 PM  [facility] [address] [place], [place], [address] Phone: [phone]   Fax:  [phone]

## 2015-07-21 ENCOUNTER — Encounter: Payer: Self-pay | Admitting: Physical Therapy

## 2015-07-21 ENCOUNTER — Ambulatory Visit: Payer: Commercial Managed Care - HMO | Admitting: Physical Therapy

## 2015-07-21 DIAGNOSIS — M79662 Pain in left lower leg: Secondary | ICD-10-CM | POA: Diagnosis not present

## 2015-07-21 DIAGNOSIS — R2689 Other abnormalities of gait and mobility: Secondary | ICD-10-CM | POA: Diagnosis not present

## 2015-07-21 DIAGNOSIS — M6281 Muscle weakness (generalized): Secondary | ICD-10-CM | POA: Diagnosis not present

## 2015-07-21 DIAGNOSIS — R2681 Unsteadiness on feet: Secondary | ICD-10-CM

## 2015-07-21 DIAGNOSIS — R29898 Other symptoms and signs involving the musculoskeletal system: Secondary | ICD-10-CM

## 2015-07-21 NOTE — Therapy (Signed)
[facility] [address] [place], [place], [address] Phone: [phone]   Fax:  [phone]  Physical Therapy Treatment  Patient Details  Name: [name] MRN: [id number] Date of Birth: August 20, 1965 Referring Provider: Frankey Shown, MD  Encounter Date: 07/21/2015      PT End of Session - 07/21/15 1548    Visit Number 15   Number of Visits 17   Date for PT Re-Evaluation 07/08/15   Authorization Type Medicare G-Code & progress report every 10 visits   PT Start Time 7169   PT Stop Time 1530   PT Time Calculation (min) 43 min   Equipment Utilized During Treatment Gait belt   Activity Tolerance Treatment limited secondary to medical complications (Comment)   Behavior During Therapy [facility] for tasks assessed/performed      Past Medical History  Diagnosis Date  . Anxiety   . Depression   . Hypertension   . HIV infection ([place]) 2000  . Asthma     HOSPITALIZED WITH EXCERBATION OF ASTHMA - AND BRONCHITIS AND THE FLU DEC 2013  . Shortness of breath     ALLERGIES ARE "ACTING UP"  . Diabetes mellitus     ON INSULIN AND ORAL MEDICATIONS  . GERD (gastroesophageal reflux disease)   . Neuropathy     NEUROPATHY HANDS AND FEET  . Arthritis     LEFT HIP  . Pain     SEVERE PAIN LEFT HIP - HX OF LEFT HIP PINNING JULY 2013    Past Surgical History  Procedure Laterality Date  . Hip pinning,cannulated  07/16/2011    Procedure: CANNULATED HIP PINNING;  Surgeon: Gearlean Alf, MD;  [facility];  Service: Orthopedics;  Laterality: Left;  . Hip pinning,cannulated Left 04/09/2012    Procedure: CANNULATED HIP PINNING AND HARDWARE REVISION;  Surgeon: Gearlean Alf, MD;  [facility];  Service: Orthopedics;  Laterality: Left;  . Hip arthroplasty Left 05/05/2014    Procedure: ARTHROPLASTY OPEN REDUCTION INTERNAL FIXATION LEFT HIP AND REMOVAL OF TWO CANNULATED SCREW;  Surgeon: Latanya Maudlin, MD;  [facility];  Service:  Orthopedics;  Laterality: Left;  . Cast application Left 06/07/8936    Procedure: CAST APPLICATION (FIBERGLASS);  Surgeon: Latanya Maudlin, MD;  [facility];  Service: Orthopedics;  Laterality: Left;  CLOSED REDUCTION OF LEFT COLLES FRACTURE WITH SHORT ARM CAST  . Hardware removal Left 05/05/2014    Procedure: HARDWARE REMOVAL LEFT HIP;  Surgeon: Latanya Maudlin, MD;  [facility];  Service: Orthopedics;  Laterality: Left;  REMOVAL BIOMET 6.5-8.0 CANNULATED SCREW  . Peripheral vascular catheterization N/A 11/15/2014    Procedure: Abdominal Aortogram;  Surgeon: Conrad Mandaree, MD;  Location: [facility];  Service: Cardiovascular;  Laterality: N/A;  . Peripheral vascular catheterization  11/15/2014    Procedure: Lower Extremity Angiography;  Surgeon: Conrad Moscow, MD;  Location: [facility];  Service: Cardiovascular;;  . Peripheral vascular catheterization Left 11/15/2014    Procedure: Peripheral Vascular Intervention;  Surgeon: Conrad Shannon, MD;  Location: [facility];  Service: Cardiovascular;  Laterality: Left;  popliteal artery stenting  . I&d extremity Left 11/16/2014    Procedure:  DEBRIDEMENT OF LEFT FOOT POSSIBLE APPLICATION OF INTEGRIA AND VAC ;  Surgeon: Irene Limbo, MD;  Location: [facility];  Service: Plastics;  Laterality: Left;  . Amputation Left 11/18/2014    Procedure: LEFT BELOW KNEE AMPUTATION;  Surgeon: Leandrew Koyanagi, MD;  Location: [facility];  Service: Orthopedics;  Laterality: Left;    There were no vitals filed for this visit.      Subjective Assessment - 07/21/15 1449    Subjective No new complaints. No falls. Still using rollator at home. Plans to see Fritz Pickerel next Tuesday. No pain before PT, but 7/10 pain at medial L knee after two laps on the blue track.   Patient is accompained by: Family member   Pertinent History DM2, neuropathy, HIV, mood disorder, HTN, CKD (stage IV), asthma, arthritis, left hip fracture with ORIF 07/16/2011 and hardware removal with  new pinning 05/05/2014   Limitations Lifting;Standing;Walking   Patient Stated Goals to use prosthesis to go in community including shopping, church, [place] meetings   Currently in Pain? No/denies   Pain Score 0-No pain           [facility] Adult PT Treatment/Exercise - 07/21/15 1447    Transfers   Transfers Sit to Stand;Stand to Sit   Sit to Stand 6: Modified independent (Device/Increase time);With upper extremity assist;With armrests;From chair/3-in-1   Sit to Stand Details Verbal cues for technique   Stand to Sit 6: Modified independent (Device/Increase time);With upper extremity assist;With armrests;To chair/3-in-1   Stand to Sit Details (indicate cue type and reason) Verbal cues for technique   Ambulation/Gait   Ambulation/Gait Yes   Ambulation/Gait Assistance 4: Min guard;5: Supervision   Ambulation/Gait Assistance Details VC for upright posture, no LOB   Ambulation Distance (Feet) 125 Feet  x 2, then 70 x 2 all on level floor   Assistive device Prosthesis;Rollator   Gait Pattern Step-through pattern;Decreased step length - right;Decreased stance time - left;Decreased step length - left;Decreased stride length;Trunk flexed;Narrow base of support   Ambulation Surface Level;Indoor   Stairs Yes   Stairs Assistance 4: Min guard   Stairs Assistance Details (indicate cue type and reason) VC for sequencing   Stair Management Technique Two rails;Step to pattern;Forwards   Number of Stairs 4  up/down x 1   Ramp 5: Supervision  up/down ramp x 1   Ramp Details (indicate cue type and reason) VC for sequencing with Rollator   Curb 5: Supervision  up/down curb x 1   Curb Details (indicate cue type and reason) VC for sequencing with Rollator   Neuro Re-ed    Neuro Re-ed Details  Standing with hands on Rollator B alternating toe taps on hemisphere placed on floor in front of and between pt's feet 10 reps each foot x 1 (VC for technique and to try to not use hands on Rollator as much, Supervision  with hands on Rollator)           PT Short Term Goals - 06/13/15 1654    PT SHORT TERM GOAL #1   Title Patient tolerates wear of prosthesis >12hrs/day without skin issues. (Target Date: 06/10/2015)   Status Achieved   PT SHORT TERM GOAL #2   Title Patient properly donnes prosthesis and verbalizes proper adjustment of wear schedule. (Target Date: 06/10/2015)   Status Achieved   PT SHORT TERM GOAL #3   Title Patient ambulates 100' with RW & prosthesis with supervision. (Target Date: 06/10/2015)   Status Achieved   PT SHORT TERM GOAL #4   Title Patient negotiates ramps & curbs with RW & prosthesis with minA. (Target Date: 06/10/2015)   Status Achieved   PT SHORT TERM GOAL #5   Title Patient performs standing balance activities with RW support reaching 10" all directions, to floor and manages clothes safely. (Target  Date: 06/10/2015)   Baseline Pt able to reach to floor and reach 10" across midline and anteriorly, unable to reach 10" laterally    Time 4   Period Weeks   Status Partially Met           PT Long Term Goals - 07/11/15 1456    PT LONG TERM GOAL #1   Title Patient tolerates wear of prosthesis >90% of awake hours without skin issues or limb tenderness. (Target Date: 07/08/2015)   Baseline 07/11/15: pt is wearing the prosthesis all awake hours, however does still have tenderness/pain with prosthesis wear   Time 8   Period Weeks   Status Partially Met   PT LONG TERM GOAL #2   Title Patient verbalizes & demonstrates understanding of prosthetic care to enable safe use of prosthesis.  (Target Date: 07/08/2015)   Baseline 07/11/15: pt still needs cues on sock management at times   Time --   Period --   Status Partially Met   PT LONG TERM GOAL #3   Title Berg Balance > 24/56 to indicate lower fall risk.  (Target Date: 07/08/2015)   Baseline 07/11/15: scored 33/56   Time --   Period --   Status Achieved   PT LONG TERM GOAL #4   Title Timed Up & Go <20 seconds modified independent  safely.  (Target Date: 07/08/2015)   Baseline 07/11/15: 31.43 sec's with rollator   Time --   Period --   Status Not Met   PT LONG TERM GOAL #5   Title Patient ambulates 250' with LRAD & prosthesis modified independent to enable safe community mobility.  (Target Date: 07/08/2015)   Baseline 07/11/15: pt has come close to the distance with 220 feet acheived with supervision using rollator    Time --   Period --   Status Not Met   PT LONG TERM GOAL #6   Title Patient negotiates ramps & curbs with LRAD & prosthesis modified indpendent to enable safe community mobility.  (Target Date: 07/08/2015)   Baseline 07/11/15: pt needed supervision with rollator with previous session    Time --   Period --   Status Not Met            Plan - 07/21/15 1549    Clinical Impression Statement Pt ambulated for 125' indoors on blue track at one time before needing to sit. She is slowly progressing her one-time ambulation distance, but is limited by fatigue. Session continued to focus on gait on stairs and curbs. Overall, patient is making steady progress and should benefit from skilled PT to address ongoing goals.   Rehab Potential Good   PT Frequency 2x / week   PT Duration 8 weeks   PT Treatment/Interventions ADLs/Self Care Home Management;DME Instruction;Gait training;Stair training;Functional mobility training;Therapeutic activities;Therapeutic exercise;Balance training;Neuromuscular re-education;Patient/family education;Prosthetic Training;Vestibular   PT Next Visit Plan Continue with gait with rollator/prosthesis, work on both static and dynamic standing balance activities   Consulted and Agree with Plan of Care Patient      Patient will benefit from skilled therapeutic intervention in order to improve the following deficits and impairments:  Abnormal gait, Decreased activity tolerance, Decreased balance, Decreased endurance, Decreased knowledge of use of DME, Decreased mobility, Impaired flexibility,  Decreased strength, Postural dysfunction, Prosthetic Dependency, Pain  Visit Diagnosis: Other abnormalities of gait and mobility  Unsteadiness on feet  Muscle weakness (generalized)  Other symptoms and signs involving the musculoskeletal system  Pain in left lower leg  Problem List Patient Active Problem List   Diagnosis Date Noted  . Diastolic dysfunction 68/03/2120  . Diabetic neuropathy ([place]) 11/10/2014  . Chronic kidney disease (CKD), stage IV (severe) ([place]) 11/10/2014  . Fracture of distal femur ([place]) 07/08/2014  . Osteoporosis 07/08/2014  . Femur fracture, left ([place]) 07/08/2014  . Colles' fracture of left radius   . Left hip pain 05/03/2014  . Hip pain   . Perimenopausal symptoms 02/13/2012  . Hyponatremia 12/29/2011  . Asthma exacerbation attacks 12/28/2011  . Bronchitis 12/28/2011  . Repeated falls 07/30/2011  . Hip fracture, left ([place]) 07/16/2011  . Femoral neck fracture ([place]) 07/16/2011  . Dyslipidemia 12/23/2008  . DIARRHEA 08/30/2008  . FATIGUE 07/12/2008  . THRUSH 05/20/2008  . Human immunodeficiency virus (HIV) disease ([place]) 04/19/2008  . Diabetes mellitus type 2 with complications, uncontrolled ([place]) 04/19/2008  . Mood disorder ([place]) 04/19/2008  . Hereditary and idiopathic peripheral neuropathy 04/19/2008  . Essential hypertension 04/19/2008    Trixie Deis, [place] 07/21/2015, 3:55 PM  [facility] [address] [place], [place], [address] Phone: [phone]   Fax:  [phone]  Name: LOLITA FAULDS MRN: [id number] Date of Birth: October 03, 1965  This note has been reviewed and edited by supervising CI.  Willow Ora, PTA, [facility] [address] [place], [place] [address] [phone] 07/22/2015, 11:39 AM

## 2015-07-26 ENCOUNTER — Ambulatory Visit: Payer: Commercial Managed Care - HMO | Admitting: Physical Therapy

## 2015-07-26 ENCOUNTER — Encounter: Payer: Self-pay | Admitting: Physical Therapy

## 2015-07-26 DIAGNOSIS — M79662 Pain in left lower leg: Secondary | ICD-10-CM

## 2015-07-26 DIAGNOSIS — R29898 Other symptoms and signs involving the musculoskeletal system: Secondary | ICD-10-CM | POA: Diagnosis not present

## 2015-07-26 DIAGNOSIS — R2681 Unsteadiness on feet: Secondary | ICD-10-CM | POA: Diagnosis not present

## 2015-07-26 DIAGNOSIS — R2689 Other abnormalities of gait and mobility: Secondary | ICD-10-CM | POA: Diagnosis not present

## 2015-07-26 DIAGNOSIS — M6281 Muscle weakness (generalized): Secondary | ICD-10-CM

## 2015-07-26 NOTE — Therapy (Signed)
[facility] [address] [place], [place], [address] Phone: [phone]   Fax:  [phone]  Physical Therapy Treatment  Patient Details  Name: [name] MRN: [id number] Date of Birth: June 03, 1965 Referring Provider: Frankey Shown, MD  Encounter Date: 07/26/2015      PT End of Session - 07/26/15 1517    Visit Number 16   Number of Visits 17   Date for PT Re-Evaluation 07/08/15   Authorization Type Medicare G-Code & progress report every 10 visits   PT Start Time 1402   PT Stop Time 1445   PT Time Calculation (min) 43 min   Equipment Utilized During Treatment Gait belt   Activity Tolerance Treatment limited secondary to medical complications (Comment)   Behavior During Therapy [facility] for tasks assessed/performed      Past Medical History:  Diagnosis Date  . Anxiety   . Arthritis    LEFT HIP  . Asthma    HOSPITALIZED WITH EXCERBATION OF ASTHMA - AND BRONCHITIS AND THE FLU DEC 2013  . Depression   . Diabetes mellitus    ON INSULIN AND ORAL MEDICATIONS  . GERD (gastroesophageal reflux disease)   . HIV infection ([place]) 2000  . Hypertension   . Neuropathy    NEUROPATHY HANDS AND FEET  . Pain    SEVERE PAIN LEFT HIP - HX OF LEFT HIP PINNING JULY 2013  . Shortness of breath    ALLERGIES ARE "ACTING UP"    Past Surgical History:  Procedure Laterality Date  . AMPUTATION Left 11/18/2014   Procedure: LEFT BELOW KNEE AMPUTATION;  Surgeon: Leandrew Koyanagi, MD;  Location: [facility];  Service: Orthopedics;  Laterality: Left;  . CAST APPLICATION Left 07/04/5327   Procedure: CAST APPLICATION (FIBERGLASS);  Surgeon: Latanya Maudlin, MD;  [facility];  Service: Orthopedics;  Laterality: Left;  CLOSED REDUCTION OF LEFT COLLES FRACTURE WITH SHORT ARM CAST  . HARDWARE REMOVAL Left 05/05/2014   Procedure: HARDWARE REMOVAL LEFT HIP;  Surgeon: Latanya Maudlin, MD;  [facility];  Service: Orthopedics;  Laterality: Left;  REMOVAL  BIOMET 6.5-8.0 CANNULATED SCREW  . HIP ARTHROPLASTY Left 05/05/2014   Procedure: ARTHROPLASTY OPEN REDUCTION INTERNAL FIXATION LEFT HIP AND REMOVAL OF TWO CANNULATED SCREW;  Surgeon: Latanya Maudlin, MD;  [facility];  Service: Orthopedics;  Laterality: Left;  . HIP PINNING,CANNULATED  07/16/2011   Procedure: CANNULATED HIP PINNING;  Surgeon: Gearlean Alf, MD;  [facility];  Service: Orthopedics;  Laterality: Left;  . HIP PINNING,CANNULATED Left 04/09/2012   Procedure: CANNULATED HIP PINNING AND HARDWARE REVISION;  Surgeon: Gearlean Alf, MD;  [facility];  Service: Orthopedics;  Laterality: Left;  . I&D EXTREMITY Left 11/16/2014   Procedure:  DEBRIDEMENT OF LEFT FOOT POSSIBLE APPLICATION OF INTEGRIA AND VAC ;  Surgeon: Irene Limbo, MD;  Location: [facility];  Service: Plastics;  Laterality: Left;  . PERIPHERAL VASCULAR CATHETERIZATION N/A 11/15/2014   Procedure: Abdominal Aortogram;  Surgeon: Conrad Moniteau, MD;  Location: [facility];  Service: Cardiovascular;  Laterality: N/A;  . PERIPHERAL VASCULAR CATHETERIZATION  11/15/2014   Procedure: Lower Extremity Angiography;  Surgeon: Conrad Sheldon, MD;  Location: [facility];  Service: Cardiovascular;;  . PERIPHERAL VASCULAR CATHETERIZATION Left 11/15/2014   Procedure: Peripheral Vascular Intervention;  Surgeon: Conrad La Luz, MD;  Location: [facility];  Service: Cardiovascular;  Laterality: Left;  popliteal artery stenting    There were no vitals filed for this  visit.      Subjective Assessment - 07/26/15 1402    Subjective No new complaints. No falls. Still using rollator at home. Saw Fritz Pickerel recently and he adjusted length of leg. Pt. states that leg feels weird.   Patient is accompained by: Family member   Pertinent History DM2, neuropathy, HIV, mood disorder, HTN, CKD (stage IV), asthma, arthritis, left hip fracture with ORIF 07/16/2011 and hardware removal with new pinning 05/05/2014   Limitations  Lifting;Standing;Walking   Patient Stated Goals to use prosthesis to go in community including shopping, church, [place] meetings   Currently in Pain? No/denies   Pain Score 0-No pain              [facility] Adult PT Treatment/Exercise - 07/26/15 1404      Transfers   Transfers Sit to Stand;Stand to Sit   Sit to Stand 6: Modified independent (Device/Increase time);With upper extremity assist;With armrests;From chair/3-in-1   Sit to Stand Details Verbal cues for technique   Stand to Sit 6: Modified independent (Device/Increase time);With upper extremity assist;With armrests;To chair/3-in-1   Stand to Sit Details (indicate cue type and reason) Verbal cues for technique     Ambulation/Gait   Ambulation/Gait Yes   Ambulation/Gait Assistance 4: Min guard;5: Supervision   Ambulation/Gait Assistance Details VC for upright posture   Ambulation Distance (Feet) 125 Feet  x 2, then 70 x 4 all on level floor   Assistive device Prosthesis;Rollator   Gait Pattern Step-through pattern;Decreased step length - right;Decreased stance time - left;Decreased step length - left;Decreased stride length;Trunk flexed;Narrow base of support   Ambulation Surface Level;Indoor   Stairs Yes   Stairs Assistance 4: Min guard   Stairs Assistance Details (indicate cue type and reason) VC for proper technique and to go up with good, down with bad   Stair Management Technique Two rails;Step to pattern;Forwards   Number of Stairs 4  up/down x 1   Ramp 5: Supervision  up/down ramp x 1   Ramp Details (indicate cue type and reason) No cues   Curb 5: Supervision  up/down curb x 1   Curb Details (indicate cue type and reason) VC for proper technique     Neuro Re-ed    Neuro Re-ed Details  Standing with hands on Rollator B alternating toe taps on hemispheres placed lateral to Rollator on floor 10 reps each foot x 1 (VC for technique and to try to not use hands on Rollator as much, Supervision with hands on Rollator);  Standing facing counter with hands on counter for support side stepping, with cone toe taps once each foot in between side steps along length of counter x1 (VC for technique, Min guard for safety, no LOB)            PT Short Term Goals - 06/13/15 1654      PT SHORT TERM GOAL #1   Title Patient tolerates wear of prosthesis >12hrs/day without skin issues. (Target Date: 06/10/2015)   Status Achieved     PT SHORT TERM GOAL #2   Title Patient properly donnes prosthesis and verbalizes proper adjustment of wear schedule. (Target Date: 06/10/2015)   Status Achieved     PT SHORT TERM GOAL #3   Title Patient ambulates 100' with RW & prosthesis with supervision. (Target Date: 06/10/2015)   Status Achieved     PT SHORT TERM GOAL #4   Title Patient negotiates ramps & curbs with RW & prosthesis with minA. (Target Date: 06/10/2015)   Status  Achieved     PT SHORT TERM GOAL #5   Title Patient performs standing balance activities with RW support reaching 10" all directions, to floor and manages clothes safely. (Target Date: 06/10/2015)   Baseline Pt able to reach to floor and reach 10" across midline and anteriorly, unable to reach 10" laterally    Time 4   Period Weeks   Status Partially Met           PT Long Term Goals - 07/11/15 1456      PT LONG TERM GOAL #1   Title Patient tolerates wear of prosthesis >90% of awake hours without skin issues or limb tenderness. (Target Date: 07/08/2015)   Baseline 07/11/15: pt is wearing the prosthesis all awake hours, however does still have tenderness/pain with prosthesis wear   Time 8   Period Weeks   Status Partially Met     PT LONG TERM GOAL #2   Title Patient verbalizes & demonstrates understanding of prosthetic care to enable safe use of prosthesis.  (Target Date: 07/08/2015)   Baseline 07/11/15: pt still needs cues on sock management at times   Time --   Period --   Status Partially Met     PT LONG TERM GOAL #3   Title Berg Balance > 24/56 to  indicate lower fall risk.  (Target Date: 07/08/2015)   Baseline 07/11/15: scored 33/56   Time --   Period --   Status Achieved     PT LONG TERM GOAL #4   Title Timed Up & Go <20 seconds modified independent safely.  (Target Date: 07/08/2015)   Baseline 07/11/15: 31.43 sec's with rollator   Time --   Period --   Status Not Met     PT LONG TERM GOAL #5   Title Patient ambulates 250' with LRAD & prosthesis modified independent to enable safe community mobility.  (Target Date: 07/08/2015)   Baseline 07/11/15: pt has come close to the distance with 220 feet acheived with supervision using rollator    Time --   Period --   Status Not Met     PT LONG TERM GOAL #6   Title Patient negotiates ramps & curbs with LRAD & prosthesis modified indpendent to enable safe community mobility.  (Target Date: 07/08/2015)   Baseline 07/11/15: pt needed supervision with rollator with previous session    Time --   Period --   Status Not Met           Plan - 07/26/15 1518    Clinical Impression Statement Pt. is continuing to ambulate for 125' indoors on blue track at one time before needing to sit. Pt. fatigues very easily. Session continued to focus on gait on floor, stairs, ramp, and curb. Introduced dynamic balance with cone toe taps. She is making steady progress and should benefit from skilled PT to address ongoing goals.   Rehab Potential Good   PT Frequency 2x / week   PT Duration 8 weeks   PT Treatment/Interventions ADLs/Self Care Home Management;DME Instruction;Gait training;Stair training;Functional mobility training;Therapeutic activities;Therapeutic exercise;Balance training;Neuromuscular re-education;Patient/family education;Prosthetic Training;Vestibular   PT Next Visit Plan Continue with gait with rollator/prosthesis, work on both static and dynamic standing balance activities   Consulted and Agree with Plan of Care Patient      Patient will benefit from skilled therapeutic intervention in order  to improve the following deficits and impairments:  Abnormal gait, Decreased activity tolerance, Decreased balance, Decreased endurance, Decreased knowledge of use of DME, Decreased mobility,  Impaired flexibility, Decreased strength, Postural dysfunction, Prosthetic Dependency, Pain  Visit Diagnosis: Other abnormalities of gait and mobility  Unsteadiness on feet  Muscle weakness (generalized)  Other symptoms and signs involving the musculoskeletal system  Pain in left lower leg     Problem List Patient Active Problem List   Diagnosis Date Noted  . Diastolic dysfunction 03/49/6116  . Diabetic neuropathy ([place]) 11/10/2014  . Chronic kidney disease (CKD), stage IV (severe) ([place]) 11/10/2014  . Fracture of distal femur ([place]) 07/08/2014  . Osteoporosis 07/08/2014  . Femur fracture, left ([place]) 07/08/2014  . Colles' fracture of left radius   . Left hip pain 05/03/2014  . Hip pain   . Perimenopausal symptoms 02/13/2012  . Hyponatremia 12/29/2011  . Asthma exacerbation attacks 12/28/2011  . Bronchitis 12/28/2011  . Repeated falls 07/30/2011  . Hip fracture, left ([place]) 07/16/2011  . Femoral neck fracture ([place]) 07/16/2011  . Dyslipidemia 12/23/2008  . DIARRHEA 08/30/2008  . FATIGUE 07/12/2008  . THRUSH 05/20/2008  . Human immunodeficiency virus (HIV) disease ([place]) 04/19/2008  . Diabetes mellitus type 2 with complications, uncontrolled ([place]) 04/19/2008  . Mood disorder ([place]) 04/19/2008  . Hereditary and idiopathic peripheral neuropathy 04/19/2008  . Essential hypertension 04/19/2008    Trixie Deis, [place] 07/26/2015, 3:23 PM  [facility] [address] [place], [place], [address] Phone: [phone]   Fax:  [phone]  Name: [name] MRN: [id number] Date of Birth: 10/07/1965  This note has been reviewed and edited by supervising CI.  Willow Ora, PTA, [facility] [address]  [place], [place] [address] [phone] 07/27/15, 3:58 PM

## 2015-07-27 ENCOUNTER — Other Ambulatory Visit: Payer: Self-pay | Admitting: Endocrinology

## 2015-07-28 ENCOUNTER — Encounter: Payer: Self-pay | Admitting: Physical Therapy

## 2015-07-28 ENCOUNTER — Ambulatory Visit: Payer: Commercial Managed Care - HMO | Admitting: Physical Therapy

## 2015-07-28 DIAGNOSIS — M79662 Pain in left lower leg: Secondary | ICD-10-CM | POA: Diagnosis not present

## 2015-07-28 DIAGNOSIS — R29898 Other symptoms and signs involving the musculoskeletal system: Secondary | ICD-10-CM

## 2015-07-28 DIAGNOSIS — R2689 Other abnormalities of gait and mobility: Secondary | ICD-10-CM

## 2015-07-28 DIAGNOSIS — R2681 Unsteadiness on feet: Secondary | ICD-10-CM

## 2015-07-28 DIAGNOSIS — M6281 Muscle weakness (generalized): Secondary | ICD-10-CM

## 2015-07-31 NOTE — Therapy (Signed)
[facility] [address] [place], [place], [address] Phone: [phone]   Fax:  [phone]  Physical Therapy Treatment  Patient Details  Name: [name] MRN: [id number] Date of Birth: September 26, 1965 Referring Provider: Frankey Shown, MD  Encounter Date: 07/28/2015   07/28/15 1409  PT Visits / Re-Eval  Visit Number 17  Number of Visits 17  Date for PT Re-Evaluation 07/08/15  Authorization  Authorization Type Medicare G-Code & progress report every 10 visits  PT Time Calculation  PT Start Time 1405  PT Stop Time 1445  PT Time Calculation (min) 40 min  PT - End of Session  Equipment Utilized During Treatment Gait belt  Activity Tolerance Treatment limited secondary to medical complications (Comment)  Behavior During Therapy [facility] for tasks assessed/performed     Past Medical History:  Diagnosis Date  . Anxiety   . Arthritis    LEFT HIP  . Asthma    HOSPITALIZED WITH EXCERBATION OF ASTHMA - AND BRONCHITIS AND THE FLU DEC 2013  . Depression   . Diabetes mellitus    ON INSULIN AND ORAL MEDICATIONS  . GERD (gastroesophageal reflux disease)   . HIV infection ([place]) 2000  . Hypertension   . Neuropathy    NEUROPATHY HANDS AND FEET  . Pain    SEVERE PAIN LEFT HIP - HX OF LEFT HIP PINNING JULY 2013  . Shortness of breath    ALLERGIES ARE "ACTING UP"    Past Surgical History:  Procedure Laterality Date  . AMPUTATION Left 11/18/2014   Procedure: LEFT BELOW KNEE AMPUTATION;  Surgeon: Leandrew Koyanagi, MD;  Location: [facility] [place];  Service: Orthopedics;  Laterality: Left;  . CAST APPLICATION Left 3/0/0511   Procedure: CAST APPLICATION (FIBERGLASS);  Surgeon: Latanya Maudlin, MD;  [facility];  Service: Orthopedics;  Laterality: Left;  CLOSED REDUCTION OF LEFT COLLES FRACTURE WITH SHORT ARM CAST  . HARDWARE REMOVAL Left 05/05/2014   Procedure: HARDWARE REMOVAL LEFT HIP;  Surgeon: Latanya Maudlin, MD;  [facility];   Service: Orthopedics;  Laterality: Left;  REMOVAL BIOMET 6.5-8.0 CANNULATED SCREW  . HIP ARTHROPLASTY Left 05/05/2014   Procedure: ARTHROPLASTY OPEN REDUCTION INTERNAL FIXATION LEFT HIP AND REMOVAL OF TWO CANNULATED SCREW;  Surgeon: Latanya Maudlin, MD;  [facility];  Service: Orthopedics;  Laterality: Left;  . HIP PINNING,CANNULATED  07/16/2011   Procedure: CANNULATED HIP PINNING;  Surgeon: Gearlean Alf, MD;  [facility];  Service: Orthopedics;  Laterality: Left;  . HIP PINNING,CANNULATED Left 04/09/2012   Procedure: CANNULATED HIP PINNING AND HARDWARE REVISION;  Surgeon: Gearlean Alf, MD;  [facility];  Service: Orthopedics;  Laterality: Left;  . I&D EXTREMITY Left 11/16/2014   Procedure:  DEBRIDEMENT OF LEFT FOOT POSSIBLE APPLICATION OF INTEGRIA AND VAC ;  Surgeon: Irene Limbo, MD;  Location: [facility];  Service: Plastics;  Laterality: Left;  . PERIPHERAL VASCULAR CATHETERIZATION N/A 11/15/2014   Procedure: Abdominal Aortogram;  Surgeon: Conrad [place], MD;  Location: [name];  Service: Cardiovascular;  Laterality: N/A;  . PERIPHERAL VASCULAR CATHETERIZATION  11/15/2014   Procedure: Lower Extremity Angiography;  Surgeon: Conrad Melvin, MD;  Location: [facility];  Service: Cardiovascular;;  . PERIPHERAL VASCULAR CATHETERIZATION Left 11/15/2014   Procedure: Peripheral Vascular Intervention;  Surgeon: Conrad [place], MD;  Location: [facility];  Service: Cardiovascular;  Laterality: Left;  popliteal artery stenting    There were no vitals filed for this visit.  07/28/15 1408  Symptoms/Limitations  Subjective Feels like she is getting bronchitis again. Has called her MD, taking mucinex and drinking lots of water. Does have productive cough, no fever. "I will do what I can today".  Patient is accompained by: Family member  Pertinent History DM2, neuropathy, HIV, mood disorder, HTN, CKD (stage IV), asthma, arthritis, left hip fracture with ORIF 07/16/2011  and hardware removal with new pinning 05/05/2014  Limitations Lifting;Standing;Walking  Patient Stated Goals to use prosthesis to go in community including shopping, church, AA meetings  Pain Assessment  Currently in Pain? No/denies  Pain Score 0      07/28/15 1411  Transfers  Transfers Sit to Stand;Stand to Sit  Sit to Stand 6: Modified independent (Device/Increase time);With upper extremity assist;With armrests;From chair/3-in-1  Stand to Sit 6: Modified independent (Device/Increase time);With upper extremity assist;With armrests;To chair/3-in-1  Ambulation/Gait  Ambulation/Gait Yes  Ambulation/Gait Assistance 4: Min guard  Ambulation/Gait Assistance Details SaO2 >/= 96% after gait x 2 laps. cues at times needed for posture and rollator position with gait.  Ambulation Distance (Feet) 130 Feet (x 2 reps; 60 x 2 reps)  Assistive device Prosthesis;Rollator  Gait Pattern Step-through pattern;Decreased step length - right;Decreased stance time - left;Decreased step length - left;Decreased stride length;Trunk flexed;Narrow base of support  Ambulation Surface Level;Indoor  Ramp 5: Supervision  Ramp Details (indicate cue type and reason) SaO2 0=94% after ramp and curb combined. cues to stay close to rollator on ramp.  Curb 5: Supervision  Curb Details (indicate cue type and reason) cues on stance when advancing rollator  Prosthetics  Current prosthetic wear tolerance (days/week)  daily  Current prosthetic wear tolerance (#hours/day)  all awake hours, drying as needed.   Residual limb condition  intact, callous at end of limb has cleared up.  Education Provided Residual limb care  Person(s) Educated Patient  Education Method Explanation;Verbal cues  Education Method Verbalized understanding  Donning Prosthesis 5  Doffing Prosthesis 5           PT Short Term Goals - 06/13/15 1654      PT SHORT TERM GOAL #1   Title Patient tolerates wear of prosthesis >12hrs/day without skin  issues. (Target Date: 06/10/2015)   Status Achieved     PT SHORT TERM GOAL #2   Title Patient properly donnes prosthesis and verbalizes proper adjustment of wear schedule. (Target Date: 06/10/2015)   Status Achieved     PT SHORT TERM GOAL #3   Title Patient ambulates 100' with RW & prosthesis with supervision. (Target Date: 06/10/2015)   Status Achieved     PT SHORT TERM GOAL #4   Title Patient negotiates ramps & curbs with RW & prosthesis with minA. (Target Date: 06/10/2015)   Status Achieved     PT SHORT TERM GOAL #5   Title Patient performs standing balance activities with RW support reaching 10" all directions, to floor and manages clothes safely. (Target Date: 06/10/2015)   Baseline Pt able to reach to floor and reach 10" across midline and anteriorly, unable to reach 10" laterally    Time 4   Period Weeks   Status Partially Met           PT Long Term Goals - 07/11/15 1456      PT LONG TERM GOAL #1   Title Patient tolerates wear of prosthesis >90% of awake hours without skin issues or limb tenderness. (Target Date: 07/08/2015)   Baseline 07/11/15: pt is wearing the prosthesis all awake hours, however  does still have tenderness/pain with prosthesis wear   Time 8   Period Weeks   Status Partially Met     PT LONG TERM GOAL #2   Title Patient verbalizes & demonstrates understanding of prosthetic care to enable safe use of prosthesis.  (Target Date: 07/08/2015)   Baseline 07/11/15: pt still needs cues on sock management at times   Time --   Period --   Status Partially Met     PT LONG TERM GOAL #3   Title Berg Balance > 24/56 to indicate lower fall risk.  (Target Date: 07/08/2015)   Baseline 07/11/15: scored 33/56   Time --   Period --   Status Achieved     PT LONG TERM GOAL #4   Title Timed Up & Go <20 seconds modified independent safely.  (Target Date: 07/08/2015)   Baseline 07/11/15: 31.43 sec's with rollator   Time --   Period --   Status Not Met     PT LONG TERM GOAL #5    Title Patient ambulates 250' with LRAD & prosthesis modified independent to enable safe community mobility.  (Target Date: 07/08/2015)   Baseline 07/11/15: pt has come close to the distance with 220 feet acheived with supervision using rollator    Time --   Period --   Status Not Met     PT LONG TERM GOAL #6   Title Patient negotiates ramps & curbs with LRAD & prosthesis modified indpendent to enable safe community mobility.  (Target Date: 07/08/2015)   Baseline 07/11/15: pt needed supervision with rollator with previous session    Time --   Period --   Status Not Met        07/28/15 1410  Plan  Clinical Impression Statement Pt's oxygen levels remained above 90% with activity performed today. Pt continues to fatigue quicky with activity. Pt is making slow, steady progress toward goals.  Pt will benefit from skilled therapeutic intervention in order to improve on the following deficits Abnormal gait;Decreased activity tolerance;Decreased balance;Decreased endurance;Decreased knowledge of use of DME;Decreased mobility;Impaired flexibility;Decreased strength;Postural dysfunction;Prosthetic Dependency;Pain  Rehab Potential Good  PT Frequency 2x / week  PT Duration 8 weeks  PT Treatment/Interventions ADLs/Self Care Home Management;DME Instruction;Gait training;Stair training;Functional mobility training;Therapeutic activities;Therapeutic exercise;Balance training;Neuromuscular re-education;Patient/family education;Prosthetic Training;Vestibular  PT Next Visit Plan Continue with gait with rollator/prosthesis, work on both static and dynamic standing balance activities  Consulted and Agree with Plan of Care Patient       Patient will benefit from skilled therapeutic intervention in order to improve the following deficits and impairments:  Abnormal gait, Decreased activity tolerance, Decreased balance, Decreased endurance, Decreased knowledge of use of DME, Decreased mobility, Impaired flexibility,  Decreased strength, Postural dysfunction, Prosthetic Dependency, Pain  Visit Diagnosis: Other abnormalities of gait and mobility  Unsteadiness on feet  Muscle weakness (generalized)  Other symptoms and signs involving the musculoskeletal system     Problem List Patient Active Problem List   Diagnosis Date Noted  . Diastolic dysfunction 40/97/3532  . Diabetic neuropathy ([place]) 11/10/2014  . Chronic kidney disease (CKD), stage IV (severe) ([place]) 11/10/2014  . Fracture of distal femur ([place]) 07/08/2014  . Osteoporosis 07/08/2014  . Femur fracture, left ([place]) 07/08/2014  . Colles' fracture of left radius   . Left hip pain 05/03/2014  . Hip pain   . Perimenopausal symptoms 02/13/2012  . Hyponatremia 12/29/2011  . Asthma exacerbation attacks 12/28/2011  . Bronchitis 12/28/2011  . Repeated falls 07/30/2011  . Hip fracture, left (  [place]) 07/16/2011  . Femoral neck fracture ([place]) 07/16/2011  . Dyslipidemia 12/23/2008  . DIARRHEA 08/30/2008  . FATIGUE 07/12/2008  . THRUSH 05/20/2008  . Human immunodeficiency virus (HIV) disease ([place]) 04/19/2008  . Diabetes mellitus type 2 with complications, uncontrolled ([place]) 04/19/2008  . Mood disorder ([place]) 04/19/2008  . Hereditary and idiopathic peripheral neuropathy 04/19/2008  . Essential hypertension 04/19/2008    Willow Ora, PTA, [facility] [address], [place], [place] [address] [phone] 07/31/15, 2:19 PM   Name: [name] MRN: [id number] Date of Birth: [birth year]

## 2015-08-02 ENCOUNTER — Ambulatory Visit: Payer: Commercial Managed Care - HMO | Attending: Orthopaedic Surgery | Admitting: Physical Therapy

## 2015-08-02 ENCOUNTER — Encounter: Payer: Self-pay | Admitting: Physical Therapy

## 2015-08-02 DIAGNOSIS — R2681 Unsteadiness on feet: Secondary | ICD-10-CM

## 2015-08-02 DIAGNOSIS — M79662 Pain in left lower leg: Secondary | ICD-10-CM | POA: Diagnosis not present

## 2015-08-02 DIAGNOSIS — R29898 Other symptoms and signs involving the musculoskeletal system: Secondary | ICD-10-CM | POA: Diagnosis not present

## 2015-08-02 DIAGNOSIS — M6281 Muscle weakness (generalized): Secondary | ICD-10-CM | POA: Diagnosis not present

## 2015-08-02 DIAGNOSIS — R2689 Other abnormalities of gait and mobility: Secondary | ICD-10-CM

## 2015-08-03 DIAGNOSIS — K219 Gastro-esophageal reflux disease without esophagitis: Secondary | ICD-10-CM | POA: Diagnosis not present

## 2015-08-03 DIAGNOSIS — E7800 Pure hypercholesterolemia, unspecified: Secondary | ICD-10-CM | POA: Diagnosis not present

## 2015-08-03 DIAGNOSIS — Z794 Long term (current) use of insulin: Secondary | ICD-10-CM | POA: Diagnosis not present

## 2015-08-03 DIAGNOSIS — Z21 Asymptomatic human immunodeficiency virus [HIV] infection status: Secondary | ICD-10-CM | POA: Diagnosis not present

## 2015-08-03 DIAGNOSIS — R635 Abnormal weight gain: Secondary | ICD-10-CM | POA: Diagnosis not present

## 2015-08-03 DIAGNOSIS — I10 Essential (primary) hypertension: Secondary | ICD-10-CM | POA: Diagnosis not present

## 2015-08-03 DIAGNOSIS — E1169 Type 2 diabetes mellitus with other specified complication: Secondary | ICD-10-CM | POA: Diagnosis not present

## 2015-08-03 NOTE — Addendum Note (Signed)
Addended by: Isaias Cowman on: 08/03/2015 08:01 PM   Modules accepted: Orders

## 2015-08-03 NOTE — Therapy (Signed)
[facility] [address] [place], [place], [address] Phone: [phone]   Fax:  [phone]  Physical Therapy Treatment  Patient Details  Name: [name] MRN: [id number] Date of Birth: December 27, 1965 Referring Provider: Frankey Shown, MD  Encounter Date: 08/02/2015      PT End of Session - 08/02/15 1700    Visit Number 18   Number of Visits 27   Date for PT Re-Evaluation 09/02/15   Authorization Type Medicare G-Code & progress report every 10 visits   PT Start Time 20   PT Stop Time 1653   PT Time Calculation (min) 23 min   Equipment Utilized During Treatment Gait belt   Activity Tolerance Treatment limited secondary to medical complications (Comment)   Behavior During Therapy [facility] for tasks assessed/performed      Past Medical History:  Diagnosis Date  . Anxiety   . Arthritis    LEFT HIP  . Asthma    HOSPITALIZED WITH EXCERBATION OF ASTHMA - AND BRONCHITIS AND THE FLU DEC 2013  . Depression   . Diabetes mellitus    ON INSULIN AND ORAL MEDICATIONS  . GERD (gastroesophageal reflux disease)   . HIV infection ([place]) 2000  . Hypertension   . Neuropathy    NEUROPATHY HANDS AND FEET  . Pain    SEVERE PAIN LEFT HIP - HX OF LEFT HIP PINNING JULY 2013  . Shortness of breath    ALLERGIES ARE "ACTING UP"    Past Surgical History:  Procedure Laterality Date  . AMPUTATION Left 11/18/2014   Procedure: LEFT BELOW KNEE AMPUTATION;  Surgeon: Leandrew Koyanagi, MD;  Location: [facility];  Service: Orthopedics;  Laterality: Left;  . CAST APPLICATION Left 99991111   Procedure: CAST APPLICATION (FIBERGLASS);  Surgeon: Latanya Maudlin, MD;  [facility];  Service: Orthopedics;  Laterality: Left;  CLOSED REDUCTION OF LEFT COLLES FRACTURE WITH SHORT ARM CAST  . HARDWARE REMOVAL Left 05/05/2014   Procedure: HARDWARE REMOVAL LEFT HIP;  Surgeon: Latanya Maudlin, MD;  [facility];  Service: Orthopedics;  Laterality: Left;  REMOVAL  BIOMET 6.5-8.0 CANNULATED SCREW  . HIP ARTHROPLASTY Left 05/05/2014   Procedure: ARTHROPLASTY OPEN REDUCTION INTERNAL FIXATION LEFT HIP AND REMOVAL OF TWO CANNULATED SCREW;  Surgeon: Latanya Maudlin, MD;  [facility];  Service: Orthopedics;  Laterality: Left;  . HIP PINNING,CANNULATED  07/16/2011   Procedure: CANNULATED HIP PINNING;  Surgeon: Gearlean Alf, MD;  [facility];  Service: Orthopedics;  Laterality: Left;  . HIP PINNING,CANNULATED Left 04/09/2012   Procedure: CANNULATED HIP PINNING AND HARDWARE REVISION;  Surgeon: Gearlean Alf, MD;  [facility];  Service: Orthopedics;  Laterality: Left;  . I&D EXTREMITY Left 11/16/2014   Procedure:  DEBRIDEMENT OF LEFT FOOT POSSIBLE APPLICATION OF INTEGRIA AND VAC ;  Surgeon: Irene Limbo, MD;  Location: [facility];  Service: Plastics;  Laterality: Left;  . PERIPHERAL VASCULAR CATHETERIZATION N/A 11/15/2014   Procedure: Abdominal Aortogram;  Surgeon: Conrad [place], MD;  Location: [facility];  Service: Cardiovascular;  Laterality: N/A;  . PERIPHERAL VASCULAR CATHETERIZATION  11/15/2014   Procedure: Lower Extremity Angiography;  Surgeon: Conrad [place], MD;  Location: [facility];  Service: Cardiovascular;;  . PERIPHERAL VASCULAR CATHETERIZATION Left 11/15/2014   Procedure: Peripheral Vascular Intervention;  Surgeon: Conrad [place], MD;  Location: [facility];  Service: Cardiovascular;  Laterality: Left;  popliteal artery stenting    There were no vitals filed for this  visit.      Subjective Assessment - 08/02/15 1631    Subjective She is wearing her prosthesis all awake hours without issues.    Patient is accompained by: Family member   Pertinent History DM2, neuropathy, HIV, mood disorder, HTN, CKD (stage IV), asthma, arthritis, left hip fracture with ORIF 07/16/2011 and hardware removal with new pinning 05/05/2014   Limitations Lifting;Standing;Walking   Patient Stated Goals to use prosthesis to go in community  including shopping, church, [place] meetings   Currently in Pain? Yes   Pain Score 7    Pain Location Back   Pain Orientation Lower;Upper   Pain Descriptors / Indicators Aching   Pain Type Chronic pain   Pain Onset More than a month ago   Pain Frequency Intermittent   Aggravating Factors  coughing with congestion   Pain Relieving Factors laying down                         [facility] Adult PT Treatment/Exercise - 08/02/15 1630      Transfers   Transfers Sit to Stand;Stand to Sit   Sit to Stand 5: Supervision;With upper extremity assist;With armrests;From chair/3-in-1   Sit to Stand Details (indicate cue type and reason) cues on rollator safety   Stand to Sit 5: Supervision;With upper extremity assist;With armrests;To chair/3-in-1  cues on rollator safety     Ambulation/Gait   Ambulation/Gait Yes   Ambulation/Gait Assistance 4: Min guard   Ambulation/Gait Assistance Details cues on posture, rollator wheel lock safety & weight shift   Ambulation Distance (Feet) 130 Feet   Assistive device Prosthesis;Rollator   Gait Pattern Step-through pattern;Decreased step length - right;Decreased stance time - left;Decreased step length - left;Decreased stride length;Trunk flexed;Narrow base of support   Ambulation Surface Indoor;Level   Ramp 4: Min assist  rollator walker & prosthesis   Ramp Details (indicate cue type and reason) verbal & tactile cues on rollator management, posture & wt shift   Curb 4: Min assist  rollator walker & prosthesis   Curb Details (indicate cue type and reason) cues on rollator control & technique     Prosthetics   Current prosthetic wear tolerance (days/week)  daily   Current prosthetic wear tolerance (#hours/day)  all awake hours, drying as needed.    Residual limb condition  intact, no open areas   Education Provided Residual limb care;Correct ply sock adjustment   Person(s) Educated Patient   Education Method Explanation;Verbal cues   Education  Method Verbalized understanding;Verbal cues required;Needs further instruction                PT Education - 08/02/15 1630    Education provided Yes   Education Details Standard RW if goal is going out in community alone & how to load in car and use of power mobility at larger stores. Use of rollator walker if going out with friend who can load in car to enable resting point.    Person(s) Educated Patient   Methods Explanation;Verbal cues   Comprehension Verbalized understanding;Verbal cues required          PT Short Term Goals - 08/02/15 1633      PT SHORT TERM GOAL #1   Title Patient tolerates wear >90% of awake hours without tenderness or skin issues. (Target Date: 08/05/2015)   Time 4   Period Weeks   Status New     PT SHORT TERM GOAL #2   Title Berg Balance >/= 26/56 to  indicate lower fall risk. (Target Date: 08/05/2015)   Time 4   Period Weeks   Status New     PT SHORT TERM GOAL #3   Title Patient ambulates 150' with rollator walker including paved surfaces outside with no balance losses. (Target Date: 08/05/2015)   Time 4   Period Weeks   Status New     PT SHORT TERM GOAL #4   Title Patient negotiates ramps & curbs with rollator walker & prosthesis with supervision. (Target Date: 08/05/2015)   Time 4   Period Weeks   Status New     PT SHORT TERM GOAL #5   Title Patient reports pain increases <2 increments from baseline at start of session with standing & gait activities noted above. (Target Date: 08/05/2015)   Baseline --   Time 4   Period Weeks   Status New     Additional Short Term Goals   Additional Short Term Goals Yes           PT Long Term Goals - 07/12/15 1930      PT LONG TERM GOAL #1   Title Patient tolerates wear of prosthesis >90% of awake hours without skin issues or limb tenderness. (NEW Target Date: 08/29/2015)   Baseline 07/11/15: pt is wearing the prosthesis all awake hours, however does still have tenderness/pain with prosthesis wear    Time 8   Period Weeks   Status On-going     PT LONG TERM GOAL #2   Title Patient verbalizes & demonstrates understanding of prosthetic care including sock ply management & skin management to enable safe use of prosthesis.  (Target Date: 07/08/2015)   Baseline 07/11/15: pt still needs cues on sock management at times   Status Revised     PT LONG TERM GOAL #3   Title Berg Balance > 36/56 to indicate lower fall risk.  (NEW Target Date: 08/29/2015)   Baseline 07/11/15: scored 33/56   Time 8   Period Weeks   Status Revised     PT LONG TERM GOAL #4   Title Timed Up & Go <25 seconds with cane modified independent safely.  (NEW Target Date: 08/29/2015)   Baseline 07/11/15: 31.43 sec's with rollator   Time 8   Period Weeks   Status Revised     PT LONG TERM GOAL #5   Title Patient ambulates 400' with one seated rest using rollator walker & prosthesis modified independent to enable safe community mobility.  (NEW Target Date: 08/29/2015)   Baseline 07/11/15: pt has come close to the distance with 220 feet acheived with supervision using rollator    Time 8   Period Weeks   Status Revised     Additional Long Term Goals   Additional Long Term Goals Yes     PT LONG TERM GOAL #6   Title Patient negotiates ramps & curbs with rollator walker & prosthesis modified indpendent to enable safe community mobility.  (NEW Target Date: 08/29/2015)   Baseline 07/11/15: pt needed supervision with rollator with previous session    Time [place] - 08/02/15 1941    Clinical Impression Statement Patient has potential to use rollator walker at community level and less restrictive device in home where distances are limited. Patient's balance continues to improve but needs additional skilled care to decrease fall risk further. Patient is limited by chronic back pain  but is responding to skilled instruction in modifying her ADLs & HEP.   Rehab Potential Good   PT Frequency  2x / week   PT Duration 8 weeks   PT Treatment/Interventions ADLs/Self Care Home Management;DME Instruction;Gait training;Stair training;Functional mobility training;Therapeutic activities;Therapeutic exercise;Balance training;Neuromuscular re-education;Patient/family education;Prosthetic Training;Vestibular   PT Next Visit Plan Assess STGs. Continue with gait with rollator/prosthesis, work on both static and dynamic standing balance activities   Consulted and Agree with Plan of Care Patient      Patient will benefit from skilled therapeutic intervention in order to improve the following deficits and impairments:  Abnormal gait, Decreased activity tolerance, Decreased balance, Decreased endurance, Decreased knowledge of use of DME, Decreased mobility, Impaired flexibility, Decreased strength, Postural dysfunction, Prosthetic Dependency, Pain  Visit Diagnosis: Other abnormalities of gait and mobility  Unsteadiness on feet  Muscle weakness (generalized)  Other symptoms and signs involving the musculoskeletal system     Problem List Patient Active Problem List   Diagnosis Date Noted  . Diastolic dysfunction 99991111  . Diabetic neuropathy ([place]) 11/10/2014  . Chronic kidney disease (CKD), stage IV (severe) ([place]) 11/10/2014  . Fracture of distal femur ([place]) 07/08/2014  . Osteoporosis 07/08/2014  . Femur fracture, left ([place]) 07/08/2014  . Colles' fracture of left radius   . Left hip pain 05/03/2014  . Hip pain   . Perimenopausal symptoms 02/13/2012  . Hyponatremia 12/29/2011  . Asthma exacerbation attacks 12/28/2011  . Bronchitis 12/28/2011  . Repeated falls 07/30/2011  . Hip fracture, left ([place]) 07/16/2011  . Femoral neck fracture ([place]) 07/16/2011  . Dyslipidemia 12/23/2008  . DIARRHEA 08/30/2008  . FATIGUE 07/12/2008  . THRUSH 05/20/2008  . Human immunodeficiency virus (HIV) disease ([place]) 04/19/2008  . Diabetes mellitus type 2 with complications, uncontrolled ([place])  04/19/2008  . Mood disorder ([place]) 04/19/2008  . Hereditary and idiopathic peripheral neuropathy 04/19/2008  . Essential hypertension 04/19/2008    Jamey Reas PT, DPT 08/03/2015, 8:01 PM  [facility] [address] [place], [place], [address] Phone: [phone]   Fax:  [phone]  Name: [name] MRN: [id number] Date of Birth: 07-17-1965

## 2015-08-03 NOTE — Addendum Note (Signed)
Addended by: Isaias Cowman on: 08/03/2015 07:54 PM   Modules accepted: Orders

## 2015-08-04 ENCOUNTER — Ambulatory Visit: Payer: Commercial Managed Care - HMO | Admitting: Physical Therapy

## 2015-08-04 ENCOUNTER — Encounter: Payer: Self-pay | Admitting: Physical Therapy

## 2015-08-04 DIAGNOSIS — R2681 Unsteadiness on feet: Secondary | ICD-10-CM

## 2015-08-04 DIAGNOSIS — M6281 Muscle weakness (generalized): Secondary | ICD-10-CM | POA: Diagnosis not present

## 2015-08-04 DIAGNOSIS — R2689 Other abnormalities of gait and mobility: Secondary | ICD-10-CM | POA: Diagnosis not present

## 2015-08-04 DIAGNOSIS — R29898 Other symptoms and signs involving the musculoskeletal system: Secondary | ICD-10-CM | POA: Diagnosis not present

## 2015-08-04 DIAGNOSIS — M79662 Pain in left lower leg: Secondary | ICD-10-CM | POA: Diagnosis not present

## 2015-08-04 LAB — LIPID PANEL
CHOLESTEROL: 157 mg/dL (ref 0–200)
HDL: 52 mg/dL (ref 35–70)
LDL Cholesterol: 82 mg/dL
TRIGLYCERIDES: 118 mg/dL (ref 40–160)

## 2015-08-05 NOTE — Therapy (Signed)
[facility] [address] [place], [place], [address] Phone: [phone]   Fax:  [phone]  Physical Therapy Treatment  Patient Details  Name: [name] MRN: [id number] Date of Birth: [birth year] Referring Provider: Frankey Shown, MD  Encounter Date: 08/04/2015      PT End of Session - 08/04/15 1540    Visit Number 19   Number of Visits 27   Date for PT Re-Evaluation 09/02/15   Authorization Type Medicare G-Code & progress report every 10 visits   PT Start Time 1532   PT Stop Time 1615   PT Time Calculation (min) 43 min   Equipment Utilized During Treatment Gait belt   Activity Tolerance Treatment limited secondary to medical complications (Comment)   Behavior During Therapy [facility] for tasks assessed/performed      Past Medical History:  Diagnosis Date  . Anxiety   . Arthritis    LEFT HIP  . Asthma    HOSPITALIZED WITH EXCERBATION OF ASTHMA - AND BRONCHITIS AND THE FLU DEC 2013  . Depression   . Diabetes mellitus    ON INSULIN AND ORAL MEDICATIONS  . GERD (gastroesophageal reflux disease)   . HIV infection ([place]) 2000  . Hypertension   . Neuropathy    NEUROPATHY HANDS AND FEET  . Pain    SEVERE PAIN LEFT HIP - HX OF LEFT HIP PINNING JULY 2013  . Shortness of breath    ALLERGIES ARE "ACTING UP"    Past Surgical History:  Procedure Laterality Date  . AMPUTATION Left 11/18/2014   Procedure: LEFT BELOW KNEE AMPUTATION;  Surgeon: Leandrew Koyanagi, MD;  Location: [facility];  Service: Orthopedics;  Laterality: Left;  . CAST APPLICATION Left 07/01/6965   Procedure: CAST APPLICATION (FIBERGLASS);  Surgeon: Latanya Maudlin, MD;  Location: [facility];  Service: Orthopedics;  Laterality: Left;  CLOSED REDUCTION OF LEFT COLLES FRACTURE WITH SHORT ARM CAST  . HARDWARE REMOVAL Left 05/05/2014   Procedure: HARDWARE REMOVAL LEFT HIP;  Surgeon: Latanya Maudlin, MD;  [facility];  Service: Orthopedics;  Laterality: Left;  REMOVAL  BIOMET 6.5-8.0 CANNULATED SCREW  . HIP ARTHROPLASTY Left 05/05/2014   Procedure: ARTHROPLASTY OPEN REDUCTION INTERNAL FIXATION LEFT HIP AND REMOVAL OF TWO CANNULATED SCREW;  Surgeon: Latanya Maudlin, MD;  [facility];  Service: Orthopedics;  Laterality: Left;  . HIP PINNING,CANNULATED  07/16/2011   Procedure: CANNULATED HIP PINNING;  Surgeon: Gearlean Alf, MD;  [facility];  Service: Orthopedics;  Laterality: Left;  . HIP PINNING,CANNULATED Left 04/09/2012   Procedure: CANNULATED HIP PINNING AND HARDWARE REVISION;  Surgeon: Gearlean Alf, MD;  [facility];  Service: Orthopedics;  Laterality: Left;  . I&D EXTREMITY Left 11/16/2014   Procedure:  DEBRIDEMENT OF LEFT FOOT POSSIBLE APPLICATION OF INTEGRIA AND VAC ;  Surgeon: Irene Limbo, MD;  Location: [facility];  Service: Plastics;  Laterality: Left;  . PERIPHERAL VASCULAR CATHETERIZATION N/A 11/15/2014   Procedure: Abdominal Aortogram;  Surgeon: Conrad Haysville, MD;  Location: [facility];  Service: Cardiovascular;  Laterality: N/A;  . PERIPHERAL VASCULAR CATHETERIZATION  11/15/2014   Procedure: Lower Extremity Angiography;  Surgeon: Conrad McCrory, MD;  Location: [facility];  Service: Cardiovascular;;  . PERIPHERAL VASCULAR CATHETERIZATION Left 11/15/2014   Procedure: Peripheral Vascular Intervention;  Surgeon: Conrad Bergen, MD;  Location: [facility];  Service: Cardiovascular;  Laterality: Left;  popliteal artery stenting    There were no vitals filed for this  visit.      Subjective Assessment - 08/04/15 1537    Subjective Saw Ouida Sills, PA-C and is now Z-pack for bronchitis and sinus infection. Reports her breathing is "rough" today. Did use daily inhaler today before coming.    Patient is accompained by: Family member   Pertinent History DM2, neuropathy, HIV, mood disorder, HTN, CKD (stage IV), asthma, arthritis, left hip fracture with ORIF 07/16/2011 and hardware removal with new pinning 05/05/2014    Limitations Lifting;Standing;Walking   Patient Stated Goals to use prosthesis to go in community including shopping, church, [place] meetings   Currently in Pain? Yes   Pain Score 8    Pain Location Back   Pain Orientation Lower;Mid   Pain Descriptors / Indicators Aching;Sore;Tightness   Pain Type Chronic pain   Pain Frequency Constant   Aggravating Factors  coughing with congestion   Pain Relieving Factors laying down            [facility] Adult PT Treatment/Exercise - 08/04/15 1544      Transfers   Transfers Sit to Stand;Stand to Sit     Ambulation/Gait   Ambulation/Gait Yes   Ambulation/Gait Assistance 4: Min guard;5: Supervision   Ambulation/Gait Assistance Details SaO2 95%, HR 70 before gait. SaO2 96%, HR 92 after 1st gait trial.   Ambulation Distance (Feet) 150 Feet   Assistive device Prosthesis;Rollator   Gait Pattern Step-through pattern;Decreased step length - right;Decreased stance time - left;Decreased step length - left;Decreased stride length;Trunk flexed;Narrow base of support   Ambulation Surface Level;Indoor   Stairs Yes   Stairs Assistance 4: Min guard   Stairs Assistance Details (indicate cue type and reason) cues on sequencing and technique using single rail.   Stair Management Technique One rail Right;Step to pattern;Forwards   Ramp 5: Supervision   Ramp Details (indicate cue type and reason) with rollator/prothesis, no assistance needed.    Curb 5: Supervision   Curb Details (indicate cue type and reason) with rollator/prosthesis on indoor 6 inch curb, no assist needed.     Prosthetics   Current prosthetic wear tolerance (days/week)  daily   Current prosthetic wear tolerance (#hours/day)  all awake hours, drying as needed.    Residual limb condition  intact, no open areas   Donning Prosthesis Supervision   Doffing Prosthesis Supervision              PT Short Term Goals - 08/04/15 1541      PT SHORT TERM GOAL #1   Title Patient tolerates wear >90% of  awake hours without tenderness or skin issues. (Target Date: 08/05/2015)   Baseline met on 08/04/15   Time --   Period --   Status Achieved     PT SHORT TERM GOAL #2   Title Berg Balance >/= 26/56 to indicate lower fall risk. (Target Date: 08/05/2015)   Time 4   Period Weeks   Status On-going     PT SHORT TERM GOAL #3   Title Patient ambulates 150' with rollator walker including paved surfaces outside with no balance losses. (Target Date: 08/05/2015)   Baseline 08/04/15: pt able to go 150 feet, was on indoor surfaces only due to heat outside (stayed in due to pt's recent breathing issues with bronchitis/sinus infection)   Time --   Period --   Status Partially Met     PT SHORT TERM GOAL #4   Title Patient negotiates ramps & curbs with rollator walker & prosthesis with supervision. (Target Date: 08/05/2015)  Baseline 08/04/15- met    Time --   Period Weeks   Status Achieved     PT SHORT TERM GOAL #5   Title Patient reports pain increases <2 increments from baseline at start of session with standing & gait activities noted above. (Target Date: 08/05/2015)   Baseline 08/04/15: reported on only 1 increment pain increase with today's session.    Time --   Period --   Status Achieved           PT Long Term Goals - 07/12/15 1930      PT LONG TERM GOAL #1   Title Patient tolerates wear of prosthesis >90% of awake hours without skin issues or limb tenderness. (NEW Target Date: 08/29/2015)   Baseline 07/11/15: pt is wearing the prosthesis all awake hours, however does still have tenderness/pain with prosthesis wear   Time 8   Period Weeks   Status On-going     PT LONG TERM GOAL #2   Title Patient verbalizes & demonstrates understanding of prosthetic care including sock ply management & skin management to enable safe use of prosthesis.  (Target Date: 07/08/2015)   Baseline 07/11/15: pt still needs cues on sock management at times   Status Revised     PT LONG TERM GOAL #3   Title Berg Balance >  36/56 to indicate lower fall risk.  (NEW Target Date: 08/29/2015)   Baseline 07/11/15: scored 33/56   Time 8   Period Weeks   Status Revised     PT LONG TERM GOAL #4   Title Timed Up & Go <25 seconds with cane modified independent safely.  (NEW Target Date: 08/29/2015)   Baseline 07/11/15: 31.43 sec's with rollator   Time 8   Period Weeks   Status Revised     PT LONG TERM GOAL #5   Title Patient ambulates 400' with one seated rest using rollator walker & prosthesis modified independent to enable safe community mobility.  (NEW Target Date: 08/29/2015)   Baseline 07/11/15: pt has come close to the distance with 220 feet acheived with supervision using rollator    Time 8   Period Weeks   Status Revised     Additional Long Term Goals   Additional Long Term Goals Yes     PT LONG TERM GOAL #6   Title Patient negotiates ramps & curbs with rollator walker & prosthesis modified indpendent to enable safe community mobility.  (NEW Target Date: 08/29/2015)   Baseline 07/11/15: pt needed supervision with rollator with previous session    Time 8   Period Weeks   Status New           Plan - 08/04/15 1541    Clinical Impression Statement Pt has met 3/5 STGs and partially met 1 STG. Will check progress toward Berg Balance test goal next session as no time to do so today.    Rehab Potential Good   PT Frequency 2x / week   PT Duration 8 weeks   PT Treatment/Interventions ADLs/Self Care Home Management;DME Instruction;Gait training;Stair training;Functional mobility training;Therapeutic activities;Therapeutic exercise;Balance training;Neuromuscular re-education;Patient/family education;Prosthetic Training;Vestibular   PT Next Visit Plan perform Berg Balance test to check remaining  STG. G-code due next visit.  Continue with gait with rollator/prosthesis, work on both static and dynamic standing balance activities   Consulted and Agree with Plan of Care Patient      Patient will benefit from skilled  therapeutic intervention in order to improve the following deficits and impairments:  Abnormal  gait, Decreased activity tolerance, Decreased balance, Decreased endurance, Decreased knowledge of use of DME, Decreased mobility, Impaired flexibility, Decreased strength, Postural dysfunction, Prosthetic Dependency, Pain  Visit Diagnosis: Other abnormalities of gait and mobility  Unsteadiness on feet  Muscle weakness (generalized)  Other symptoms and signs involving the musculoskeletal system  Pain in left lower leg     Problem List Patient Active Problem List   Diagnosis Date Noted  . Diastolic dysfunction 49/96/9249  . Diabetic neuropathy ([facility]) 11/10/2014  . Chronic kidney disease (CKD), stage IV (severe) ([facility]) 11/10/2014  . Fracture of distal femur ([facility]) 07/08/2014  . Osteoporosis 07/08/2014  . Femur fracture, left ([facility]) 07/08/2014  . Colles' fracture of left radius   . Left hip pain 05/03/2014  . Hip pain   . Perimenopausal symptoms 02/13/2012  . Hyponatremia 12/29/2011  . Asthma exacerbation attacks 12/28/2011  . Bronchitis 12/28/2011  . Repeated falls 07/30/2011  . Hip fracture, left ([facility]) 07/16/2011  . Femoral neck fracture ([facility]) 07/16/2011  . Dyslipidemia 12/23/2008  . DIARRHEA 08/30/2008  . FATIGUE 07/12/2008  . THRUSH 05/20/2008  . Human immunodeficiency virus (HIV) disease ([facility]) 04/19/2008  . Diabetes mellitus type 2 with complications, uncontrolled ([facility]) 04/19/2008  . Mood disorder ([facility]) 04/19/2008  . Hereditary and idiopathic peripheral neuropathy 04/19/2008  . Essential hypertension 04/19/2008    Willow Ora, PTA, [facility] [address], [place], [place] [address] [phone] 08/05/15, 1:29 PM   Name: [name] MRN: [id number] Date of Birth: 01-10-1965

## 2015-08-09 ENCOUNTER — Ambulatory Visit: Payer: Commercial Managed Care - HMO | Admitting: Physical Therapy

## 2015-08-11 ENCOUNTER — Ambulatory Visit: Payer: Commercial Managed Care - HMO | Admitting: Physical Therapy

## 2015-08-11 ENCOUNTER — Encounter: Payer: Self-pay | Admitting: Physical Therapy

## 2015-08-11 DIAGNOSIS — M6281 Muscle weakness (generalized): Secondary | ICD-10-CM | POA: Diagnosis not present

## 2015-08-11 DIAGNOSIS — R2681 Unsteadiness on feet: Secondary | ICD-10-CM | POA: Diagnosis not present

## 2015-08-11 DIAGNOSIS — M79662 Pain in left lower leg: Secondary | ICD-10-CM

## 2015-08-11 DIAGNOSIS — R29898 Other symptoms and signs involving the musculoskeletal system: Secondary | ICD-10-CM | POA: Diagnosis not present

## 2015-08-11 DIAGNOSIS — R2689 Other abnormalities of gait and mobility: Secondary | ICD-10-CM

## 2015-08-14 NOTE — Therapy (Signed)
[facility] [address] [place], [place], [address] Phone: [phone]   Fax:  [phone]  Physical Therapy Treatment  Patient Details  Name: [name] MRN: [id number] Date of Birth: 1965-01-18 Referring Provider: Frankey Shown, MD  Encounter Date: 08/11/2015   08/11/15 1459  PT Visits / Re-Eval  Visit Number 20  Number of Visits 27  Date for PT Re-Evaluation 09/02/15  Authorization  Authorization Type Medicare G-Code & progress report every 10 visits  PT Time Calculation  PT Start Time 1447  PT Stop Time 1530  PT Time Calculation (min) 43 min  PT - End of Session  Equipment Utilized During Treatment Gait belt  Activity Tolerance Treatment limited secondary to medical complications (Comment)  Behavior During Therapy [facility] for tasks assessed/performed     Past Medical History:  Diagnosis Date  . Anxiety   . Arthritis    LEFT HIP  . Asthma    HOSPITALIZED WITH EXCERBATION OF ASTHMA - AND BRONCHITIS AND THE FLU DEC 2013  . Depression   . Diabetes mellitus    ON INSULIN AND ORAL MEDICATIONS  . GERD (gastroesophageal reflux disease)   . HIV infection ([facility]) 2000  . Hypertension   . Neuropathy    NEUROPATHY HANDS AND FEET  . Pain    SEVERE PAIN LEFT HIP - HX OF LEFT HIP PINNING JULY 2013  . Shortness of breath    ALLERGIES ARE "ACTING UP"    Past Surgical History:  Procedure Laterality Date  . AMPUTATION Left 11/18/2014   Procedure: LEFT BELOW KNEE AMPUTATION;  Surgeon: Leandrew Koyanagi, MD;  Location: [facility];  Service: Orthopedics;  Laterality: Left;  . CAST APPLICATION Left 03/01/4968   Procedure: CAST APPLICATION (FIBERGLASS);  Surgeon: Latanya Maudlin, MD;  [facility];  Service: Orthopedics;  Laterality: Left;  CLOSED REDUCTION OF LEFT COLLES FRACTURE WITH SHORT ARM CAST  . HARDWARE REMOVAL Left 05/05/2014   Procedure: HARDWARE REMOVAL LEFT HIP;  Surgeon: Latanya Maudlin, MD;  [facility];   Service: Orthopedics;  Laterality: Left;  REMOVAL BIOMET 6.5-8.0 CANNULATED SCREW  . HIP ARTHROPLASTY Left 05/05/2014   Procedure: ARTHROPLASTY OPEN REDUCTION INTERNAL FIXATION LEFT HIP AND REMOVAL OF TWO CANNULATED SCREW;  Surgeon: Latanya Maudlin, MD;  [facility];  Service: Orthopedics;  Laterality: Left;  . HIP PINNING,CANNULATED  07/16/2011   Procedure: CANNULATED HIP PINNING;  Surgeon: Gearlean Alf, MD;  [facility];  Service: Orthopedics;  Laterality: Left;  . HIP PINNING,CANNULATED Left 04/09/2012   Procedure: CANNULATED HIP PINNING AND HARDWARE REVISION;  Surgeon: Gearlean Alf, MD;  [facility];  Service: Orthopedics;  Laterality: Left;  . I&D EXTREMITY Left 11/16/2014   Procedure:  DEBRIDEMENT OF LEFT FOOT POSSIBLE APPLICATION OF INTEGRIA AND VAC ;  Surgeon: Irene Limbo, MD;  Location: [facility];  Service: Plastics;  Laterality: Left;  . PERIPHERAL VASCULAR CATHETERIZATION N/A 11/15/2014   Procedure: Abdominal Aortogram;  Surgeon: Conrad Ettrick, MD;  Location: [facility];  Service: Cardiovascular;  Laterality: N/A;  . PERIPHERAL VASCULAR CATHETERIZATION  11/15/2014   Procedure: Lower Extremity Angiography;  Surgeon: Conrad Conley, MD;  Location: [facility];  Service: Cardiovascular;;  . PERIPHERAL VASCULAR CATHETERIZATION Left 11/15/2014   Procedure: Peripheral Vascular Intervention;  Surgeon: Conrad Austin, MD;  Location: [facility];  Service: Cardiovascular;  Laterality: Left;  popliteal artery stenting    There were no vitals filed for this visit.  08/11/15 1455  Symptoms/Limitations  Subjective On second round of antibiotic's for bronchitis and sinus infection. Still getting short of breath with minimal activity and some dizziness when she bends over to retrieve things from floor, lower surfaces.  Patient is accompained by: Family member  Pertinent History DM2, neuropathy, HIV, mood disorder, HTN, CKD (stage IV), asthma, arthritis, left  hip fracture with ORIF 07/16/2011 and hardware removal with new pinning 05/05/2014  Limitations Lifting;Standing;Walking  Patient Stated Goals to use prosthesis to go in community including shopping, church, [employer] meetings  Pain Assessment  Currently in Pain? Yes  Pain Score 7  Pain Location Back  Pain Orientation Lower;Mid  Pain Descriptors / Indicators Aching;Sore  Pain Type Chronic pain  Pain Onset More than a month ago  Pain Frequency Constant  Aggravating Factors  coughing with congestion  Pain Relieving Factors laying down      08/11/15 1500  Transfers  Transfers Sit to Stand;Stand to Sit  Sit to Stand 5: Supervision;With upper extremity assist;With armrests;From chair/3-in-1  Sit to Stand Details Verbal cues for precautions/safety  Sit to Stand Details (indicate cue type and reason) cues for hand placement with standing up  Stand to Sit 5: Supervision;With upper extremity assist;With armrests;To chair/3-in-1  Stand to Sit Details (indicate cue type and reason) Verbal cues for precautions/safety;Verbal cues for safe use of DME/AE  Stand to Sit Details reminder cues to lock rollator prior to sitting down  Ambulation/Gait  Ambulation/Gait Yes  Ambulation/Gait Assistance 4: Min guard;5: Supervision  Ambulation/Gait Assistance Details 98% SaO2 before session, 96% after session. occasional cues needed for posture and walker position with gait.  Ambulation Distance (Feet) 120 Feet  Assistive device Prosthesis;Rollator  Gait Pattern Step-through pattern;Decreased step length - right;Decreased stance time - left;Decreased step length - left;Decreased stride length;Trunk flexed;Narrow base of support  Ambulation Surface Level;Indoor  Prosthetics  Current prosthetic wear tolerance (days/week)  daily  Current prosthetic wear tolerance (#hours/day)  all awake hours, drying as needed.   Residual limb condition  intact, no open areas  Donning Prosthesis 5  Doffing Prosthesis 5     08/11/15  1500  Berg Balance Test  Sit to Stand 4  Standing Unsupported 4  Sitting with Back Unsupported but Feet Supported on Floor or Stool 4  Stand to Sit 3  Transfers 3  Standing Unsupported with Eyes Closed 3  Standing Ubsupported with Feet Together 1  From Standing, Reach Forward with Outstretched Arm 3 (8 inches)  From Standing Position, Pick up Object from Floor 3  From Standing Position, Turn to Look Behind Over each Shoulder 3 (right > left)  Turn 360 Degrees 0  Standing Unsupported, Alternately Place Feet on Step/Stool 0  Standing Unsupported, One Foot in Front 3  Standing on One Leg 2  Total Score 36  Berg comment: Initial was 10/56, 07/06/15 was 23/56             PT Short Term Goals - 08/04/15 1541      PT SHORT TERM GOAL #1   Title Patient tolerates wear >90% of awake hours without tenderness or skin issues. (Target Date: 08/05/2015)   Baseline met on 08/04/15   Time --   Period --   Status Achieved     PT SHORT TERM GOAL #2   Title Berg Balance >/= 26/56 to indicate lower fall risk. (Target Date: 08/05/2015)   Time 4   Period Weeks   Status On-going     PT SHORT TERM GOAL #3  Title Patient ambulates 150' with rollator walker including paved surfaces outside with no balance losses. (Target Date: 08/05/2015)   Baseline 08/04/15: pt able to go 150 feet, was on indoor surfaces only due to heat outside (stayed in due to pt's recent breathing issues with bronchitis/sinus infection)   Time --   Period --   Status Partially Met     PT SHORT TERM GOAL #4   Title Patient negotiates ramps & curbs with rollator walker & prosthesis with supervision. (Target Date: 08/05/2015)   Baseline 08/04/15- met    Time --   Period Weeks   Status Achieved     PT SHORT TERM GOAL #5   Title Patient reports pain increases <2 increments from baseline at start of session with standing & gait activities noted above. (Target Date: 08/05/2015)   Baseline 08/04/15: reported on only 1 increment pain  increase with today's session.    Time --   Period --   Status Achieved           PT Long Term Goals - 07/12/15 1930      PT LONG TERM GOAL #1   Title Patient tolerates wear of prosthesis >90% of awake hours without skin issues or limb tenderness. (NEW Target Date: 08/29/2015)   Baseline 07/11/15: pt is wearing the prosthesis all awake hours, however does still have tenderness/pain with prosthesis wear   Time 8   Period Weeks   Status On-going     PT LONG TERM GOAL #2   Title Patient verbalizes & demonstrates understanding of prosthetic care including sock ply management & skin management to enable safe use of prosthesis.  (Target Date: 07/08/2015)   Baseline 07/11/15: pt still needs cues on sock management at times   Status Revised     PT LONG TERM GOAL #3   Title Berg Balance > 36/56 to indicate lower fall risk.  (NEW Target Date: 08/29/2015)   Baseline 07/11/15: scored 33/56   Time 8   Period Weeks   Status Revised     PT LONG TERM GOAL #4   Title Timed Up & Go <25 seconds with cane modified independent safely.  (NEW Target Date: 08/29/2015)   Baseline 07/11/15: 31.43 sec's with rollator   Time 8   Period Weeks   Status Revised     PT LONG TERM GOAL #5   Title Patient ambulates 400' with one seated rest using rollator walker & prosthesis modified independent to enable safe community mobility.  (NEW Target Date: 08/29/2015)   Baseline 07/11/15: pt has come close to the distance with 220 feet acheived with supervision using rollator    Time 8   Period Weeks   Status Revised     Additional Long Term Goals   Additional Long Term Goals Yes     PT LONG TERM GOAL #6   Title Patient negotiates ramps & curbs with rollator walker & prosthesis modified indpendent to enable safe community mobility.  (NEW Target Date: 08/29/2015)   Baseline 07/11/15: pt needed supervision with rollator with previous session    Time 8   Period Weeks   Status New        08/11/15 1459  Plan  Clinical  Impression Statement Pt met her remaining STG with improved berg balance test score of 36/56 today. Pt is making steady progress toward LTGs as well.  Pt will benefit from skilled therapeutic intervention in order to improve on the following deficits Abnormal gait;Decreased activity tolerance;Decreased balance;Decreased endurance;Decreased knowledge of  use of DME;Decreased mobility;Impaired flexibility;Decreased strength;Postural dysfunction;Prosthetic Dependency;Pain  Rehab Potential Good  PT Frequency 2x / week  PT Duration 8 weeks  PT Treatment/Interventions ADLs/Self Care Home Management;DME Instruction;Gait training;Stair training;Functional mobility training;Therapeutic activities;Therapeutic exercise;Balance training;Neuromuscular re-education;Patient/family education;Prosthetic Training;Vestibular  PT Next Visit Plan Continue with gait with rollator/prosthesis, work on both static and dynamic standing balance activities  Consulted and Agree with Plan of Care Patient         Aug 14, 2015 01-Mar-2305  PT G-Codes  Functional Assessment Tool Used Pt is wearing prosthesis daily for all awake hours. Continues to need occasional cues on sock adjustment and problem solving fit issues.               Functional Limitation Self care      Patient will benefit from skilled therapeutic intervention in order to improve the following deficits and impairments:  Abnormal gait, Decreased activity tolerance, Decreased balance, Decreased endurance, Decreased knowledge of use of DME, Decreased mobility, Impaired flexibility, Decreased strength, Postural dysfunction, Prosthetic Dependency, Pain  Visit Diagnosis: Other abnormalities of gait and mobility  Unsteadiness on feet  Muscle weakness (generalized)  Other symptoms and signs involving the musculoskeletal system  Pain in left lower leg     Problem List Patient Active Problem List   Diagnosis Date Noted  . Diastolic dysfunction 69/62/9528  .  Diabetic neuropathy ([facility]) 11/10/2014  . Chronic kidney disease (CKD), stage IV (severe) ([facility]) 11/10/2014  . Fracture of distal femur ([facility]) 07/08/2014  . Osteoporosis 07/08/2014  . Femur fracture, left ([place]) 07/08/2014  . Colles' fracture of left radius   . Left hip pain 05/03/2014  . Hip pain   . Perimenopausal symptoms 02/13/2012  . Hyponatremia 12/29/2011  . Asthma exacerbation attacks 12/28/2011  . Bronchitis 12/28/2011  . Repeated falls 07/30/2011  . Hip fracture, left ([facility]) 07/16/2011  . Femoral neck fracture ([facility] [facility]) 07/16/2011  . Dyslipidemia 12/23/2008  . DIARRHEA 08/30/2008  . FATIGUE 07/12/2008  . THRUSH 05/20/2008  . Human immunodeficiency virus (HIV) disease ([facility]) 04/19/2008  . Diabetes mellitus type 2 with complications, uncontrolled ([place] [facility]) 04/19/2008  . Mood disorder ([place]) 04/19/2008  . Hereditary and idiopathic peripheral neuropathy 04/19/2008  . Essential hypertension 04/19/2008    Willow Ora, PTA, [facility] [address], [place], [place] [address] [phone] 08/14/15, 11:05 PM   Name: HALLEIGH COMES MRN: [id number] Date of Birth: [birth year]

## 2015-08-15 ENCOUNTER — Encounter: Payer: Self-pay | Admitting: Physical Therapy

## 2015-08-15 DIAGNOSIS — M5416 Radiculopathy, lumbar region: Secondary | ICD-10-CM | POA: Diagnosis not present

## 2015-08-15 DIAGNOSIS — Z79891 Long term (current) use of opiate analgesic: Secondary | ICD-10-CM | POA: Diagnosis not present

## 2015-08-15 DIAGNOSIS — G894 Chronic pain syndrome: Secondary | ICD-10-CM | POA: Diagnosis not present

## 2015-08-15 DIAGNOSIS — Z89512 Acquired absence of left leg below knee: Secondary | ICD-10-CM | POA: Diagnosis not present

## 2015-08-15 NOTE — Therapy (Signed)
[facility] [address] [place], [place], [address] Phone: [phone]   Fax:  [phone]  Patient Details  Name: [name] MRN: [id number] Date of Birth: 03-16-1965 Referring Provider:  Frankey Shown, MD  Encounter Date: 08/15/2015   Physical Therapy Progress Note  Dates of Reporting Period: 06/20/2015 to 08/11/2015  Objective Reports of Subjective Statement: Patient reports wearing prosthesis most of awake hours which is improving her mobility and decreasing falls.   Objective Measurements: Patient needs skilled cues on adjusting ply socks for volume changes. She is ambulating with walker in home and rollator walker in community with skilled instruction in safety.   Goal Update:      PT Short Term Goals - 08/14/15 2305      PT SHORT TERM GOAL #1   Title Patient tolerates wear >90% of awake hours without tenderness or skin issues. (Target Date: 08/05/2015)   Baseline met on 08/04/15   Status Achieved     PT SHORT TERM GOAL #2   Title Berg Balance >/= 26/56 to indicate lower fall risk. (Target Date: 08/05/2015)   Baseline 08/11/15: met with score of 36/56   Status Achieved     PT SHORT TERM GOAL #3   Title Patient ambulates 150' with rollator walker including paved surfaces outside with no balance losses. (Target Date: 08/05/2015)   Baseline 08/04/15: pt able to go 150 feet, was on indoor surfaces only due to heat outside (stayed in due to pt's recent breathing issues with bronchitis/sinus infection)   Status Partially Met     PT SHORT TERM GOAL #4   Title Patient negotiates ramps & curbs with rollator walker & prosthesis with supervision. (Target Date: 08/05/2015)   Baseline 08/04/15- met    Period Weeks   Status Achieved     PT SHORT TERM GOAL #5   Title Patient reports pain increases <2 increments from baseline at start of session with standing & gait activities noted above. (Target Date: 08/05/2015)   Baseline  08/04/15: reported on only 1 increment pain increase with today's session.    Status Achieved      Plan: continue established plan of care.   Reason Skilled Services are Required: Patient has chronic issues of back pain & circulation pain. She requires skilled instruction to return prior level with chronic conditions with amputation.    Naim Murtha PT, DPT 08/15/2015, 1:32 PM  [facility] [address] [place], [place], [address] Phone: [phone]   Fax:  [phone]

## 2015-08-16 ENCOUNTER — Ambulatory Visit: Payer: Commercial Managed Care - HMO | Admitting: Physical Therapy

## 2015-08-16 ENCOUNTER — Encounter: Payer: Self-pay | Admitting: Physical Therapy

## 2015-08-16 DIAGNOSIS — R2689 Other abnormalities of gait and mobility: Secondary | ICD-10-CM

## 2015-08-16 DIAGNOSIS — M6281 Muscle weakness (generalized): Secondary | ICD-10-CM

## 2015-08-16 DIAGNOSIS — R29898 Other symptoms and signs involving the musculoskeletal system: Secondary | ICD-10-CM | POA: Diagnosis not present

## 2015-08-16 DIAGNOSIS — R2681 Unsteadiness on feet: Secondary | ICD-10-CM | POA: Diagnosis not present

## 2015-08-16 DIAGNOSIS — M79662 Pain in left lower leg: Secondary | ICD-10-CM | POA: Diagnosis not present

## 2015-08-17 NOTE — Therapy (Signed)
[facility] [address] [place], [place], [address] Phone: [phone]   Fax:  [phone]  Physical Therapy Treatment  Patient Details  Name: Erin TRIGUEROS MRN: [id number] Date of Birth: [birth year] Referring Provider: Frankey Shown, MD  Encounter Date: 08/16/2015      PT End of Session - 08/16/15 2048    Visit Number 21   Number of Visits 27   Date for PT Re-Evaluation 09/02/15   Authorization Type Medicare G-Code & progress report every 10 visits   PT Start Time 1450   PT Stop Time 1530   PT Time Calculation (min) 40 min   Equipment Utilized During Treatment Gait belt   Activity Tolerance Patient limited by pain;Patient tolerated treatment well   Behavior During Therapy [facility] for tasks assessed/performed      Past Medical History:  Diagnosis Date  . Anxiety   . Arthritis    LEFT HIP  . Asthma    HOSPITALIZED WITH EXCERBATION OF ASTHMA - AND BRONCHITIS AND THE FLU DEC 2013  . Depression   . Diabetes mellitus    ON INSULIN AND ORAL MEDICATIONS  . GERD (gastroesophageal reflux disease)   . HIV infection ([place]) 2000  . Hypertension   . Neuropathy    NEUROPATHY HANDS AND FEET  . Pain    SEVERE PAIN LEFT HIP - HX OF LEFT HIP PINNING JULY 2013  . Shortness of breath    ALLERGIES ARE "ACTING UP"    Past Surgical History:  Procedure Laterality Date  . AMPUTATION Left 11/18/2014   Procedure: LEFT BELOW KNEE AMPUTATION;  Surgeon: Leandrew Koyanagi, MD;  Location: [facility];  Service: Orthopedics;  Laterality: Left;  . CAST APPLICATION Left 03/03/6710   Procedure: CAST APPLICATION (FIBERGLASS);  Surgeon: Latanya Maudlin, MD;  Location: [facility];  Service: Orthopedics;  Laterality: Left;  CLOSED REDUCTION OF LEFT COLLES FRACTURE WITH SHORT ARM CAST  . HARDWARE REMOVAL Left 05/05/2014   Procedure: HARDWARE REMOVAL LEFT HIP;  Surgeon: Latanya Maudlin, MD;  Location: [facility];  Service: Orthopedics;  Laterality: Left;  REMOVAL BIOMET  6.5-8.0 CANNULATED SCREW  . HIP ARTHROPLASTY Left 05/05/2014   Procedure: ARTHROPLASTY OPEN REDUCTION INTERNAL FIXATION LEFT HIP AND REMOVAL OF TWO CANNULATED SCREW;  Surgeon: Latanya Maudlin, MD;  Location: [facility];  Service: Orthopedics;  Laterality: Left;  . HIP PINNING,CANNULATED  07/16/2011   Procedure: CANNULATED HIP PINNING;  Surgeon: Gearlean Alf, MD;  Location: [facility];  Service: Orthopedics;  Laterality: Left;  . HIP PINNING,CANNULATED Left 04/09/2012   Procedure: CANNULATED HIP PINNING AND HARDWARE REVISION;  Surgeon: Gearlean Alf, MD;  Location: [facility];  Service: Orthopedics;  Laterality: Left;  . I&D EXTREMITY Left 11/16/2014   Procedure:  DEBRIDEMENT OF LEFT FOOT POSSIBLE APPLICATION OF INTEGRIA AND VAC ;  Surgeon: Irene Limbo, MD;  Location: [facility];  Service: Plastics;  Laterality: Left;  . PERIPHERAL VASCULAR CATHETERIZATION N/A 11/15/2014   Procedure: Abdominal Aortogram;  Surgeon: Conrad [place], MD;  Location: [facility];  Service: Cardiovascular;  Laterality: N/A;  . PERIPHERAL VASCULAR CATHETERIZATION  11/15/2014   Procedure: Lower Extremity Angiography;  Surgeon: Conrad Henning, MD;  Location: [facility];  Service: Cardiovascular;;  . PERIPHERAL VASCULAR CATHETERIZATION Left 11/15/2014   Procedure: Peripheral Vascular Intervention;  Surgeon: Conrad [place], MD;  Location: [facility];  Service: Cardiovascular;  Laterality: Left;  popliteal artery stenting    There were no vitals filed for this  visit.      Subjective Assessment - 08/16/15 1443    Subjective Bronchitis is better with antibiotics but chest is still sore from coughing & wheezing. No falls. Still wearing prosthesis all awake hours even when sick.    Patient is accompained by: Family member   Pertinent History DM2, neuropathy, HIV, mood disorder, HTN, CKD (stage IV), asthma, arthritis, left hip fracture with ORIF 07/16/2011 and hardware removal with new pinning 05/05/2014   Patient Stated  Goals to use prosthesis to go in community including shopping, church, [place] meetings   Currently in Pain? Yes   Pain Score 7    Pain Location Chest   Pain Orientation Anterior   Pain Descriptors / Indicators Aching;Sore   Pain Type Other (Comment)  bronchitis   Pain Onset More than a month ago   Pain Frequency Constant   Aggravating Factors  coughing   Pain Relieving Factors laying down                         [facility] Adult PT Treatment/Exercise - 08/16/15 1450      Transfers   Transfers Sit to Stand;Stand to Sit   Sit to Stand With upper extremity assist;With armrests;From chair/3-in-1;5: Supervision;4: Min assist  SBA chairs with armrests & Bath chairs without armrests   Sit to Stand Details Verbal cues for precautions/safety   Sit to Stand Details (indicate cue type and reason) demo & verbal cues on technique from chairs without armrests   Stand to Sit 5: Supervision;With upper extremity assist;With armrests;To chair/3-in-1;4: Min guard  SBA chairs with armrests & min guard chairs without armrests   Stand to Sit Details (indicate cue type and reason) Verbal cues for precautions/safety;Verbal cues for safe use of DME/AE   Stand to Sit Details demo & verbal cues on technique from chairs without armrests     Ambulation/Gait   Ambulation/Gait Yes   Ambulation/Gait Assistance 5: Supervision;4: Min assist  SBA rollator walker & MinA with single point cane   Ambulation/Gait Assistance Details rollator walker verbal cues on management & safety including wheel locks and positioning. Single point cane: tactile, manual & verbal cues on sequence, posture and weight shift   Ambulation Distance (Feet) 150 Feet  150' with rollator, 10' X 2 with single point cane   Assistive device Prosthesis;Rollator;Straight cane   Gait Pattern Step-through pattern;Decreased step length - right;Decreased stance time - left;Decreased step length - left;Decreased stride length;Trunk flexed;Narrow  base of support   Ambulation Surface Indoor;Level   Ramp 5: Supervision  rollator walker & prosthesis   Ramp Details (indicate cue type and reason) verbal cues on rollator walker management & sequence   Curb 5: Supervision  rollator walker & prosthesis   Curb Details (indicate cue type and reason) verbal cues on rollator walker management & sequence     Prosthetics   Current prosthetic wear tolerance (days/week)  daily   Current prosthetic wear tolerance (#hours/day)  all awake hours, drying as needed.    Residual limb condition  intact, no open areas                PT Education - 08/16/15 1500    Education provided Yes   Education Details rollator walker for community and household for back support; goal for cane for limited household to enable free hand to carry items   Person(s) Educated Patient   Methods Explanation;Verbal cues   Comprehension Verbalized understanding  PT Short Term Goals - 08/14/15 2305      PT SHORT TERM GOAL #1   Title Patient tolerates wear >90% of awake hours without tenderness or skin issues. (Target Date: 08/05/2015)   Baseline met on 08/04/15   Status Achieved     PT SHORT TERM GOAL #2   Title Berg Balance >/= 26/56 to indicate lower fall risk. (Target Date: 08/05/2015)   Baseline 08/11/15: met with score of 36/56   Status Achieved     PT SHORT TERM GOAL #3   Title Patient ambulates 150' with rollator walker including paved surfaces outside with no balance losses. (Target Date: 08/05/2015)   Baseline 08/04/15: pt able to go 150 feet, was on indoor surfaces only due to heat outside (stayed in due to pt's recent breathing issues with bronchitis/sinus infection)   Status Partially Met     PT SHORT TERM GOAL #4   Title Patient negotiates ramps & curbs with rollator walker & prosthesis with supervision. (Target Date: 08/05/2015)   Baseline 08/04/15- met    Period Weeks   Status Achieved     PT SHORT TERM GOAL #5   Title Patient reports pain  increases <2 increments from baseline at start of session with standing & gait activities noted above. (Target Date: 08/05/2015)   Baseline 08/04/15: reported on only 1 increment pain increase with today's session.    Status Achieved           PT Long Term Goals - 07/12/15 1930      PT LONG TERM GOAL #1   Title Patient tolerates wear of prosthesis >90% of awake hours without skin issues or limb tenderness. (NEW Target Date: 08/29/2015)   Baseline 07/11/15: pt is wearing the prosthesis all awake hours, however does still have tenderness/pain with prosthesis wear   Time 8   Period Weeks   Status On-going     PT LONG TERM GOAL #2   Title Patient verbalizes & demonstrates understanding of prosthetic care including sock ply management & skin management to enable safe use of prosthesis.  (Target Date: 07/08/2015)   Baseline 07/11/15: pt still needs cues on sock management at times   Status Revised     PT LONG TERM GOAL #3   Title Berg Balance > 36/56 to indicate lower fall risk.  (NEW Target Date: 08/29/2015)   Baseline 07/11/15: scored 33/56   Time 8   Period Weeks   Status Revised     PT LONG TERM GOAL #4   Title Timed Up & Go <25 seconds with cane modified independent safely.  (NEW Target Date: 08/29/2015)   Baseline 07/11/15: 31.43 sec's with rollator   Time 8   Period Weeks   Status Revised     PT LONG TERM GOAL #5   Title Patient ambulates 400' with one seated rest using rollator walker & prosthesis modified independent to enable safe community mobility.  (NEW Target Date: 08/29/2015)   Baseline 07/11/15: pt has come close to the distance with 220 feet acheived with supervision using rollator    Time 8   Period Weeks   Status Revised     Additional Long Term Goals   Additional Long Term Goals Yes     PT LONG TERM GOAL #6   Title Patient negotiates ramps & curbs with rollator walker & prosthesis modified indpendent to enable safe community mobility.  (NEW Target Date: 08/29/2015)    Baseline 07/11/15: pt needed supervision with rollator with previous session  Time 8   Period Weeks   Status New               Plan - 08/16/15 2050    Clinical Impression Statement Patient has basic understanding of sit to/from stand from chairs without armrests pushing with UEs. Patient was able to ambulate 10' with single point cane with MinA and skilled instruction in technique.    Rehab Potential Good   PT Frequency 2x / week   PT Duration 8 weeks   PT Treatment/Interventions ADLs/Self Care Home Management;DME Instruction;Gait training;Stair training;Functional mobility training;Therapeutic activities;Therapeutic exercise;Balance training;Neuromuscular re-education;Patient/family education;Prosthetic Training;Vestibular   PT Next Visit Plan Continue with gait with rollator/prosthesis, work on both static and dynamic standing balance activities. short distance (10-15') gait with single point cane    Consulted and Agree with Plan of Care Patient      Patient will benefit from skilled therapeutic intervention in order to improve the following deficits and impairments:  Abnormal gait, Decreased activity tolerance, Decreased balance, Decreased endurance, Decreased knowledge of use of DME, Decreased mobility, Impaired flexibility, Decreased strength, Postural dysfunction, Prosthetic Dependency, Pain  Visit Diagnosis: Other abnormalities of gait and mobility  Unsteadiness on feet  Muscle weakness (generalized)  Other symptoms and signs involving the musculoskeletal system     Problem List Patient Active Problem List   Diagnosis Date Noted  . Diastolic dysfunction 70/48/8891  . Diabetic neuropathy ([place]) 11/10/2014  . Chronic kidney disease (CKD), stage IV (severe) ([employer]) 11/10/2014  . Fracture of distal femur ([place]) 07/08/2014  . Osteoporosis 07/08/2014  . Femur fracture, left ([place]) 07/08/2014  . Colles' fracture of left radius   . Left hip pain 05/03/2014  . Hip pain    . Perimenopausal symptoms 02/13/2012  . Hyponatremia 12/29/2011  . Asthma exacerbation attacks 12/28/2011  . Bronchitis 12/28/2011  . Repeated falls 07/30/2011  . Hip fracture, left ([place]) 07/16/2011  . Femoral neck fracture ([place]) 07/16/2011  . Dyslipidemia 12/23/2008  . DIARRHEA 08/30/2008  . FATIGUE 07/12/2008  . THRUSH 05/20/2008  . Human immunodeficiency virus (HIV) disease ([place]) 04/19/2008  . Diabetes mellitus type 2 with complications, uncontrolled ([place]) 04/19/2008  . Mood disorder ([place]) 04/19/2008  . Hereditary and idiopathic peripheral neuropathy 04/19/2008  . Essential hypertension 04/19/2008    Jamey Reas PT, DPT 08/17/2015, 8:55 PM  [facility] [address] [place], [place], [address] Phone: [phone]   Fax:  [phone]  Name: NARJIS MIRA MRN: [id number] Date of Birth: [birth year]

## 2015-08-18 ENCOUNTER — Encounter: Payer: Self-pay | Admitting: Physical Therapy

## 2015-08-18 ENCOUNTER — Ambulatory Visit: Payer: Commercial Managed Care - HMO | Admitting: Physical Therapy

## 2015-08-18 DIAGNOSIS — R2689 Other abnormalities of gait and mobility: Secondary | ICD-10-CM | POA: Diagnosis not present

## 2015-08-18 DIAGNOSIS — M6281 Muscle weakness (generalized): Secondary | ICD-10-CM | POA: Diagnosis not present

## 2015-08-18 DIAGNOSIS — R29898 Other symptoms and signs involving the musculoskeletal system: Secondary | ICD-10-CM | POA: Diagnosis not present

## 2015-08-18 DIAGNOSIS — M79662 Pain in left lower leg: Secondary | ICD-10-CM | POA: Diagnosis not present

## 2015-08-18 DIAGNOSIS — R2681 Unsteadiness on feet: Secondary | ICD-10-CM

## 2015-08-20 NOTE — Therapy (Signed)
[facility] [address] [place], [place], [address] Phone: [phone]   Fax:  [phone]  Physical Therapy Treatment  Patient Details  Name: [name] MRN: [id number] Date of Birth: 1965-11-18 Referring Provider: Frankey Shown, MD  Encounter Date: 08/18/2015   08/18/15 1453  PT Visits / Re-Eval  Visit Number 22  Number of Visits 27  Date for PT Re-Evaluation 09/02/15  Authorization  Authorization Type Medicare G-Code & progress report every 10 visits  PT Time Calculation  PT Start Time 1448  PT Stop Time 1530  PT Time Calculation (min) 42 min  PT - End of Session  Equipment Utilized During Treatment Gait belt  Activity Tolerance Patient limited by pain;Patient tolerated treatment well  Behavior During Therapy [facility] for tasks assessed/performed     Past Medical History:  Diagnosis Date  . Anxiety   . Arthritis    LEFT HIP  . Asthma    HOSPITALIZED WITH EXCERBATION OF ASTHMA - AND BRONCHITIS AND THE FLU DEC 2013  . Depression   . Diabetes mellitus    ON INSULIN AND ORAL MEDICATIONS  . GERD (gastroesophageal reflux disease)   . HIV infection ([facility]) 2000  . Hypertension   . Neuropathy    NEUROPATHY HANDS AND FEET  . Pain    SEVERE PAIN LEFT HIP - HX OF LEFT HIP PINNING JULY 2013  . Shortness of breath    ALLERGIES ARE "ACTING UP"    Past Surgical History:  Procedure Laterality Date  . AMPUTATION Left 11/18/2014   Procedure: LEFT BELOW KNEE AMPUTATION;  Surgeon: Leandrew Koyanagi, MD;  Location: [facility];  Service: Orthopedics;  Laterality: Left;  . CAST APPLICATION Left 07/04/7157   Procedure: CAST APPLICATION (FIBERGLASS);  Surgeon: Latanya Maudlin, MD;  Location: [facility];  Service: Orthopedics;  Laterality: Left;  CLOSED REDUCTION OF LEFT COLLES FRACTURE WITH SHORT ARM CAST  . HARDWARE REMOVAL Left 05/05/2014   Procedure: HARDWARE REMOVAL LEFT HIP;  Surgeon: Latanya Maudlin, MD;  Location: [facility];  Service:  Orthopedics;  Laterality: Left;  REMOVAL BIOMET 6.5-8.0 CANNULATED SCREW  . HIP ARTHROPLASTY Left 05/05/2014   Procedure: ARTHROPLASTY OPEN REDUCTION INTERNAL FIXATION LEFT HIP AND REMOVAL OF TWO CANNULATED SCREW;  Surgeon: Latanya Maudlin, MD;  Location: [facility];  Service: Orthopedics;  Laterality: Left;  . HIP PINNING,CANNULATED  07/16/2011   Procedure: CANNULATED HIP PINNING;  Surgeon: Gearlean Alf, MD;  Location: [facility];  Service: Orthopedics;  Laterality: Left;  . HIP PINNING,CANNULATED Left 04/09/2012   Procedure: CANNULATED HIP PINNING AND HARDWARE REVISION;  Surgeon: Gearlean Alf, MD;  Location: [facility];  Service: Orthopedics;  Laterality: Left;  . I&D EXTREMITY Left 11/16/2014   Procedure:  DEBRIDEMENT OF LEFT FOOT POSSIBLE APPLICATION OF INTEGRIA AND VAC ;  Surgeon: Irene Limbo, MD;  Location: [facility];  Service: Plastics;  Laterality: Left;  . PERIPHERAL VASCULAR CATHETERIZATION N/A 11/15/2014   Procedure: Abdominal Aortogram;  Surgeon: Conrad Rocky, MD;  Location: [facility];  Service: Cardiovascular;  Laterality: N/A;  . PERIPHERAL VASCULAR CATHETERIZATION  11/15/2014   Procedure: Lower Extremity Angiography;  Surgeon: Conrad Millington, MD;  Location: [facility];  Service: Cardiovascular;;  . PERIPHERAL VASCULAR CATHETERIZATION Left 11/15/2014   Procedure: Peripheral Vascular Intervention;  Surgeon: Conrad De Smet, MD;  Location: [facility];  Service: Cardiovascular;  Laterality: Left;  popliteal artery stenting    There were no vitals filed for this visit.  08/18/15 1451  Symptoms/Limitations  Subjective No new complaints. No falls to report. Still with some back pain today, "not too bad'.  Patient is accompained by: Family member  Pertinent History DM2, neuropathy, HIV, mood disorder, HTN, CKD (stage IV), asthma, arthritis, left hip fracture with ORIF 07/16/2011 and hardware removal with new pinning 05/05/2014  Limitations Lifting;Standing;Walking  Patient  Stated Goals to use prosthesis to go in community including shopping, church, AA meetings  Pain Assessment  Currently in Pain? Yes  Pain Score 5  Pain Location (chest from coughting, back and fingers)  Pain Descriptors / Indicators Aching;Sore  Pain Type Chronic pain  Pain Onset More than a month ago  Pain Frequency Constant  Aggravating Factors  increased activity, coughing  Pain Relieving Factors resting      08/18/15 1454  Transfers  Transfers Sit to Stand;Stand to Sit  Sit to Stand 5: Supervision;With upper extremity assist;From chair/3-in-1;With armrests  Sit to Stand Details Verbal cues for precautions/safety  Sit to Stand Details (indicate cue type and reason) cues to not pull up on rollator  Stand to Sit 5: Supervision;With upper extremity assist;With armrests  Stand to Sit Details (indicate cue type and reason) Verbal cues for precautions/safety;Verbal cues for safe use of DME/AE  Stand to Sit Details cues to align fully with surface prior to sitting down.  Ambulation/Gait  Ambulation/Gait Yes  Ambulation/Gait Assistance 5: Supervision  Ambulation/Gait Assistance Details pt with   Ambulation Distance (Feet) 220 Feet (x1 with rollator; 16 ft x2 with single forearm crutch)  Assistive device Rollator;Prosthesis  Gait Pattern Step-through pattern;Decreased step length - right;Decreased stance time - left;Decreased step length - left;Decreased stride length;Trunk flexed;Narrow base of support  Knee/Hip Exercises: Aerobic  Nustep all 4 extremeties level 5 x 10 mintues with goal >/= 45 for strengthening and activity tolerance  Prosthetics  Current prosthetic wear tolerance (days/week)  daily  Current prosthetic wear tolerance (#hours/day)  all awake hours, drying as needed.   Residual limb condition  intact, no open areas  Education Provided Residual limb care;Correct ply sock adjustment  Person(s) Educated Patient  Education Method Explanation;Verbal cues  Education Method  Verbalized understanding  Donning Prosthesis 5  Doffing Prosthesis 5          PT Short Term Goals - 08/14/15 2305      PT SHORT TERM GOAL #1   Title Patient tolerates wear >90% of awake hours without tenderness or skin issues. (Target Date: 08/05/2015)   Baseline met on 08/04/15   Status Achieved     PT SHORT TERM GOAL #2   Title Berg Balance >/= 26/56 to indicate lower fall risk. (Target Date: 08/05/2015)   Baseline 08/11/15: met with score of 36/56   Status Achieved     PT SHORT TERM GOAL #3   Title Patient ambulates 150' with rollator walker including paved surfaces outside with no balance losses. (Target Date: 08/05/2015)   Baseline 08/04/15: pt able to go 150 feet, was on indoor surfaces only due to heat outside (stayed in due to pt's recent breathing issues with bronchitis/sinus infection)   Status Partially Met     PT SHORT TERM GOAL #4   Title Patient negotiates ramps & curbs with rollator walker & prosthesis with supervision. (Target Date: 08/05/2015)   Baseline 08/04/15- met    Period Weeks   Status Achieved     PT SHORT TERM GOAL #5   Title Patient reports pain increases <2 increments from baseline at start of session with standing &  gait activities noted above. (Target Date: 08/05/2015)   Baseline 08/04/15: reported on only 1 increment pain increase with today's session.    Status Achieved           PT Long Term Goals - 07/12/15 1930      PT LONG TERM GOAL #1   Title Patient tolerates wear of prosthesis >90% of awake hours without skin issues or limb tenderness. (NEW Target Date: 08/29/2015)   Baseline 07/11/15: pt is wearing the prosthesis all awake hours, however does still have tenderness/pain with prosthesis wear   Time 8   Period Weeks   Status On-going     PT LONG TERM GOAL #2   Title Patient verbalizes & demonstrates understanding of prosthetic care including sock ply management & skin management to enable safe use of prosthesis.  (Target Date: 07/08/2015)   Baseline  07/11/15: pt still needs cues on sock management at times   Status Revised     PT LONG TERM GOAL #3   Title Berg Balance > 36/56 to indicate lower fall risk.  (NEW Target Date: 08/29/2015)   Baseline 07/11/15: scored 33/56   Time 8   Period Weeks   Status Revised     PT LONG TERM GOAL #4   Title Timed Up & Go <25 seconds with cane modified independent safely.  (NEW Target Date: 08/29/2015)   Baseline 07/11/15: 31.43 sec's with rollator   Time 8   Period Weeks   Status Revised     PT LONG TERM GOAL #5   Title Patient ambulates 400' with one seated rest using rollator walker & prosthesis modified independent to enable safe community mobility.  (NEW Target Date: 08/29/2015)   Baseline 07/11/15: pt has come close to the distance with 220 feet acheived with supervision using rollator    Time 8   Period Weeks   Status Revised     Additional Long Term Goals   Additional Long Term Goals Yes     PT LONG TERM GOAL #6   Title Patient negotiates ramps & curbs with rollator walker & prosthesis modified indpendent to enable safe community mobility.  (NEW Target Date: 08/29/2015)   Baseline 07/11/15: pt needed supervision with rollator with previous session    Time 8   Period Weeks   Status New        08/18/15 1454  Plan  Clinical Impression Statement Pt was able to go further distance today with rollator with supervision assist. Also worked on short distance gait with forearm crutch as pt reports having a pair at home and wanting to compare it to a straight cane. Pt stated today she felt more secure with crutch vs cane. Will continue to work on household gait with both at this time. Pt is making steady progress toward goals                          Pt will benefit from skilled therapeutic intervention in order to improve on the following deficits Abnormal gait;Decreased activity tolerance;Decreased balance;Decreased endurance;Decreased knowledge of use of DME;Decreased mobility;Impaired  flexibility;Decreased strength;Postural dysfunction;Prosthetic Dependency;Pain  Rehab Potential Good  PT Frequency 2x / week  PT Duration 8 weeks  PT Treatment/Interventions ADLs/Self Care Home Management;DME Instruction;Gait training;Stair training;Functional mobility training;Therapeutic activities;Therapeutic exercise;Balance training;Neuromuscular re-education;Patient/family education;Prosthetic Training;Vestibular  PT Next Visit Plan Continue with gait with rollator/prosthesis, work on both static and dynamic standing balance activities. short distance (10-15') gait with single point cane/single forearm crutch.  Consulted and Agree with Plan of Care Patient      Patient will benefit from skilled therapeutic intervention in order to improve the following deficits and impairments:  Abnormal gait, Decreased activity tolerance, Decreased balance, Decreased endurance, Decreased knowledge of use of DME, Decreased mobility, Impaired flexibility, Decreased strength, Postural dysfunction, Prosthetic Dependency, Pain  Visit Diagnosis: Other abnormalities of gait and mobility  Unsteadiness on feet  Muscle weakness (generalized)  Other symptoms and signs involving the musculoskeletal system     Problem List Patient Active Problem List   Diagnosis Date Noted  . Diastolic dysfunction 24/11/4641  . Diabetic neuropathy ([facility]) 11/10/2014  . Chronic kidney disease (CKD), stage IV (severe) ([facility]) 11/10/2014  . Fracture of distal femur ([facility]) 07/08/2014  . Osteoporosis 07/08/2014  . Femur fracture, left ([facility]) 07/08/2014  . Colles' fracture of left radius   . Left hip pain 05/03/2014  . Hip pain   . Perimenopausal symptoms 02/13/2012  . Hyponatremia 12/29/2011  . Asthma exacerbation attacks 12/28/2011  . Bronchitis 12/28/2011  . Repeated falls 07/30/2011  . Hip fracture, left ([facility]) 07/16/2011  . Femoral neck fracture ([facility]) 07/16/2011  . Dyslipidemia 12/23/2008  . DIARRHEA 08/30/2008  .  FATIGUE 07/12/2008  . THRUSH 05/20/2008  . Human immunodeficiency virus (HIV) disease ([facility]) 04/19/2008  . Diabetes mellitus type 2 with complications, uncontrolled ([facility]) 04/19/2008  . Mood disorder ([facility]) 04/19/2008  . Hereditary and idiopathic peripheral neuropathy 04/19/2008  . Essential hypertension 04/19/2008    Willow Ora, PTA, [facility] [address] [place], [place] [address] [phone] 08/20/15, 6:50 PM   Name: TIRA LAFFERTY MRN: [id number] Date of Birth: 11/04/1965

## 2015-08-23 ENCOUNTER — Ambulatory Visit: Payer: Commercial Managed Care - HMO | Admitting: Physical Therapy

## 2015-08-24 DIAGNOSIS — N184 Chronic kidney disease, stage 4 (severe): Secondary | ICD-10-CM | POA: Diagnosis not present

## 2015-08-24 DIAGNOSIS — N179 Acute kidney failure, unspecified: Secondary | ICD-10-CM | POA: Diagnosis not present

## 2015-08-24 DIAGNOSIS — D638 Anemia in other chronic diseases classified elsewhere: Secondary | ICD-10-CM | POA: Diagnosis not present

## 2015-08-24 DIAGNOSIS — Z89512 Acquired absence of left leg below knee: Secondary | ICD-10-CM | POA: Diagnosis not present

## 2015-08-24 DIAGNOSIS — E669 Obesity, unspecified: Secondary | ICD-10-CM | POA: Diagnosis not present

## 2015-08-24 DIAGNOSIS — Z87898 Personal history of other specified conditions: Secondary | ICD-10-CM | POA: Diagnosis not present

## 2015-08-24 DIAGNOSIS — I10 Essential (primary) hypertension: Secondary | ICD-10-CM | POA: Diagnosis not present

## 2015-08-24 DIAGNOSIS — B20 Human immunodeficiency virus [HIV] disease: Secondary | ICD-10-CM | POA: Diagnosis not present

## 2015-08-24 DIAGNOSIS — E118 Type 2 diabetes mellitus with unspecified complications: Secondary | ICD-10-CM | POA: Diagnosis not present

## 2015-08-25 ENCOUNTER — Ambulatory Visit: Payer: Commercial Managed Care - HMO | Admitting: Physical Therapy

## 2015-08-30 ENCOUNTER — Ambulatory Visit: Payer: Commercial Managed Care - HMO | Admitting: Physical Therapy

## 2015-08-31 DIAGNOSIS — Z79899 Other long term (current) drug therapy: Secondary | ICD-10-CM | POA: Diagnosis not present

## 2015-08-31 DIAGNOSIS — Z5181 Encounter for therapeutic drug level monitoring: Secondary | ICD-10-CM | POA: Diagnosis not present

## 2015-09-01 ENCOUNTER — Encounter: Payer: Commercial Managed Care - HMO | Admitting: Physical Therapy

## 2015-09-09 ENCOUNTER — Other Ambulatory Visit: Payer: Self-pay | Admitting: Endocrinology

## 2015-09-12 ENCOUNTER — Encounter: Payer: Self-pay | Admitting: *Deleted

## 2015-09-13 ENCOUNTER — Other Ambulatory Visit: Payer: Commercial Managed Care - HMO

## 2015-09-13 ENCOUNTER — Ambulatory Visit: Payer: Commercial Managed Care - HMO | Attending: Orthopaedic Surgery | Admitting: Physical Therapy

## 2015-09-13 ENCOUNTER — Encounter: Payer: Self-pay | Admitting: Physical Therapy

## 2015-09-13 DIAGNOSIS — M6281 Muscle weakness (generalized): Secondary | ICD-10-CM | POA: Insufficient documentation

## 2015-09-13 DIAGNOSIS — R2681 Unsteadiness on feet: Secondary | ICD-10-CM | POA: Diagnosis not present

## 2015-09-13 DIAGNOSIS — M79662 Pain in left lower leg: Secondary | ICD-10-CM | POA: Insufficient documentation

## 2015-09-13 DIAGNOSIS — R29898 Other symptoms and signs involving the musculoskeletal system: Secondary | ICD-10-CM

## 2015-09-13 DIAGNOSIS — R2689 Other abnormalities of gait and mobility: Secondary | ICD-10-CM

## 2015-09-13 NOTE — Therapy (Signed)
[facility] [address] [place], [place], [address] Phone: [phone]   Fax:  [phone]  Physical Therapy Treatment  Patient Details  Name: [name] MRN: [id number] Date of Birth: 04-04-1965 Referring Provider: Frankey Shown, MD  Encounter Date: 09/13/2015      PT End of Session - 09/13/15 1142    Visit Number 23   Number of Visits 31   Date for PT Re-Evaluation 10/07/15   Authorization Type Medicare G-Code & progress report every 10 visits   PT Start Time 1020   PT Stop Time 1103   PT Time Calculation (min) 43 min   Equipment Utilized During Treatment Gait belt   Activity Tolerance Patient limited by pain;Patient tolerated treatment well   Behavior During Therapy [facility] for tasks assessed/performed      Past Medical History:  Diagnosis Date  . Anxiety   . Arthritis    LEFT HIP  . Asthma    HOSPITALIZED WITH EXCERBATION OF ASTHMA - AND BRONCHITIS AND THE FLU DEC 2013  . Depression   . Diabetes mellitus    ON INSULIN AND ORAL MEDICATIONS  . GERD (gastroesophageal reflux disease)   . HIV infection ([place]) 2000  . Hypertension   . Neuropathy    NEUROPATHY HANDS AND FEET  . Pain    SEVERE PAIN LEFT HIP - HX OF LEFT HIP PINNING JULY 2013  . Shortness of breath    ALLERGIES ARE "ACTING UP"    Past Surgical History:  Procedure Laterality Date  . AMPUTATION Left 11/18/2014   Procedure: LEFT BELOW KNEE AMPUTATION;  Surgeon: Leandrew Koyanagi, MD;  Location: [facility];  Service: Orthopedics;  Laterality: Left;  . CAST APPLICATION Left 06/09/319   Procedure: CAST APPLICATION (FIBERGLASS);  Surgeon: Latanya Maudlin, MD;  Location: [facility];  Service: Orthopedics;  Laterality: Left;  CLOSED REDUCTION OF LEFT COLLES FRACTURE WITH SHORT ARM CAST  . HARDWARE REMOVAL Left 05/05/2014   Procedure: HARDWARE REMOVAL LEFT HIP;  Surgeon: Latanya Maudlin, MD;  Location: [facility];  Service: Orthopedics;  Laterality: Left;  REMOVAL BIOMET  6.5-8.0 CANNULATED SCREW  . HIP ARTHROPLASTY Left 05/05/2014   Procedure: ARTHROPLASTY OPEN REDUCTION INTERNAL FIXATION LEFT HIP AND REMOVAL OF TWO CANNULATED SCREW;  Surgeon: Latanya Maudlin, MD;  Location: [facility];  Service: Orthopedics;  Laterality: Left;  . HIP PINNING,CANNULATED  07/16/2011   Procedure: CANNULATED HIP PINNING;  Surgeon: Gearlean Alf, MD;  Location: [facility];  Service: Orthopedics;  Laterality: Left;  . HIP PINNING,CANNULATED Left 04/09/2012   Procedure: CANNULATED HIP PINNING AND HARDWARE REVISION;  Surgeon: Gearlean Alf, MD;  Location: [facility];  Service: Orthopedics;  Laterality: Left;  . I&D EXTREMITY Left 11/16/2014   Procedure:  DEBRIDEMENT OF LEFT FOOT POSSIBLE APPLICATION OF INTEGRIA AND VAC ;  Surgeon: Irene Limbo, MD;  Location: [facility];  Service: Plastics;  Laterality: Left;  . PERIPHERAL VASCULAR CATHETERIZATION N/A 11/15/2014   Procedure: Abdominal Aortogram;  Surgeon: Conrad Huetter, MD;  Location: [facility];  Service: Cardiovascular;  Laterality: N/A;  . PERIPHERAL VASCULAR CATHETERIZATION  11/15/2014   Procedure: Lower Extremity Angiography;  Surgeon: Conrad Salesville, MD;  Location: [facility];  Service: Cardiovascular;;  . PERIPHERAL VASCULAR CATHETERIZATION Left 11/15/2014   Procedure: Peripheral Vascular Intervention;  Surgeon: Conrad Duluth, MD;  Location: [facility];  Service: Cardiovascular;  Laterality: Left;  popliteal artery stenting    There were no vitals filed for this  visit.      Subjective Assessment - 09/13/15 1021    Subjective She is wearing prosthesis all awake hours. She thinks prosthesis is short.    Patient is accompained by: Family member   Pertinent History DM2, neuropathy, HIV, mood disorder, HTN, CKD (stage IV), asthma, arthritis, left hip fracture with ORIF 07/16/2011 and hardware removal with new pinning 05/05/2014   Limitations Lifting;Standing;Walking   Patient Stated Goals to use prosthesis to go in community  including shopping, church, [place] meetings   Currently in Pain? Yes   Pain Score 5    Pain Location Hip   Pain Orientation Left;Right   Pain Descriptors / Indicators Aching   Pain Type Chronic pain   Pain Onset More than a month ago   Pain Frequency Constant   Aggravating Factors  weather, increased activity.   Pain Relieving Factors resting            [facility] PT Assessment - 09/13/15 1015      Berg Balance Test   Sit to Stand Able to stand  independently using hands   Standing Unsupported Able to stand safely 2 minutes   Sitting with Back Unsupported but Feet Supported on Floor or Stool Able to sit safely and securely 2 minutes   Stand to Sit Controls descent by using hands   Transfers Able to transfer safely, definite need of hands   Standing Unsupported with Eyes Closed Able to stand 10 seconds safely   Standing Ubsupported with Feet Together Able to place feet together independently but unable to hold for 30 seconds   From Standing, Reach Forward with Outstretched Arm Can reach forward >12 cm safely (5")   From Standing Position, Pick up Object from Floor Able to pick up shoe, needs supervision   From Standing Position, Turn to Look Behind Over each Shoulder Needs supervision when turning   Turn 360 Degrees Needs assistance while turning   Standing Unsupported, Alternately Place Feet on Step/Stool Needs assistance to keep from falling or unable to try   Standing Unsupported, One Foot in [employer] balance while stepping or standing   Standing on One Leg Tries to lift leg/unable to hold 3 seconds but remains standing independently   Total Score 31                     [facility] Adult PT Treatment/Exercise - 09/13/15 1015      Transfers   Transfers Sit to Stand;Stand to Sit   Sit to Stand 5: Supervision;With upper extremity assist;With armrests;From chair/3-in-1   Stand to Sit 5: Supervision;With upper extremity assist;With armrests;To chair/3-in-1      Ambulation/Gait   Ambulation/Gait Yes   Ambulation/Gait Assistance 5: Supervision;4: Min assist  SBA with rollator and MinA with single forearm crutch   Ambulation/Gait Assistance Details verbal cues on posture with rollator. Tactile & verbal cues on balance reactions & posture with Rt crutch   Ambulation Distance (Feet) 163 Feet  163' w/ rollator but fatigued after Merrilee Jansky, 30' w/crutch   Assistive device Rollator;Prosthesis;R Forearm Crutch   Gait Pattern Step-through pattern;Decreased step length - right;Decreased stance time - left;Decreased step length - left;Decreased stride length;Trunk flexed;Lateral trunk lean to left;Abducted - left   Ambulation Surface Indoor;Level   Gait velocity 1.59 ft/sec with rollator     Prosthetics   Prosthetic Care Comments  use dermatology lotion for rash and lotion at night if no rash present.   prosthesis appears 1/2" too short. Appt scheduled 9/15 at  11   Current prosthetic wear tolerance (days/week)  daily   Current prosthetic wear tolerance (#hours/day)  all awake hours, drying as needed.    Residual limb condition  severe rash area under liner with multiple red circular raised areas. Appears to more than heat rash with dermatalogic rash. She reports MD has given her a cream to use when needed.    Education Provided Residual limb care;Skin check   Person(s) Educated Patient   Education Method Explanation;Demonstration;Verbal cues   Education Method Verbalized understanding;Verbal cues required;Needs further instruction                  PT Short Term Goals - 08/14/15 2305      PT SHORT TERM GOAL #1   Title Patient tolerates wear >90% of awake hours without tenderness or skin issues. (Target Date: 08/05/2015)   Baseline met on 08/04/15   Status Achieved     PT SHORT TERM GOAL #2   Title Berg Balance >/= 26/56 to indicate lower fall risk. (Target Date: 08/05/2015)   Baseline 08/11/15: met with score of 36/56   Status Achieved     PT SHORT  TERM GOAL #3   Title Patient ambulates 150' with rollator walker including paved surfaces outside with no balance losses. (Target Date: 08/05/2015)   Baseline 08/04/15: pt able to go 150 feet, was on indoor surfaces only due to heat outside (stayed in due to pt's recent breathing issues with bronchitis/sinus infection)   Status Partially Met     PT SHORT TERM GOAL #4   Title Patient negotiates ramps & curbs with rollator walker & prosthesis with supervision. (Target Date: 08/05/2015)   Baseline 08/04/15- met    Period Weeks   Status Achieved     PT SHORT TERM GOAL #5   Title Patient reports pain increases <2 increments from baseline at start of session with standing & gait activities noted above. (Target Date: 08/05/2015)   Baseline 08/04/15: reported on only 1 increment pain increase with today's session.    Status Achieved           PT Long Term Goals - 09/13/15 1144      PT LONG TERM GOAL #1   Title Patient tolerates wear of prosthesis >90% of awake hours without skin issues or limb tenderness. (NEW Target Date: 10/07/2015)   Baseline 09/13/2015 ongoing Pt is wearing prosthesis all awake hours with severe dermatologic rash.    Time 4   Period Weeks   Status On-going     PT LONG TERM GOAL #2   Title Patient verbalizes & demonstrates understanding of prosthetic care including sock ply management & skin management to enable safe use of prosthesis. (NEW Target Date: 10/07/2015)   Baseline 09/13/2015 ongoing pt has severe dermatologic rash   Time 4   Period Weeks   Status On-going     PT LONG TERM GOAL #3   Title Berg Balance > 36/56 to indicate lower fall risk.  (NEW Target Date: 10/07/2015)   Baseline 09/12/2015 Berg Balance 31/56   Time 4   Period Weeks   Status On-going     PT LONG TERM GOAL #4   Title Timed Up & Go <25 seconds with cane or single crutch modified independent safely.  (NEW Target Date: 10/07/2015)   Time 4   Period Weeks   Status On-going     PT LONG TERM GOAL #5    Title Patient ambulates 400' total with one seated rest using rollator walker &  prosthesis modified independent to enable safe community mobility. (NEW Target Date: 10/07/2015)   Baseline 09/13/2015 progressing Pt ambulated 163' with rollator with supervision.    Time 4   Period Weeks   Status On-going     PT LONG TERM GOAL #6   Title Patient negotiates ramps & curbs with rollator walker & prosthesis modified indpendent to enable safe community mobility.  (NEW Target Date: 10/07/2015)   Time 4   Period Weeks   Status On-going               Plan - 09/13/15 1149    Clinical Impression Statement Patient did not meet LTGs established due to medical issues limiting attendance for last few weeks of plan of care. She has potential to ambulate with prosthesis with a rollator walker in community & limited household with single forearm crutch with further skilled care. See note for progress towards LTGs.    Rehab Potential Good   PT Frequency 2x / week   PT Duration 4 weeks   PT Treatment/Interventions ADLs/Self Care Home Management;DME Instruction;Gait training;Stair training;Functional mobility training;Therapeutic activities;Therapeutic exercise;Balance training;Neuromuscular re-education;Patient/family education;Prosthetic Training;Vestibular   PT Next Visit Plan Continue with community gait with rollator/prosthesis and limited household with single forearm crutch, work on both static and dynamic standing balance activities. short distance (10-15') gait with single point cane/single forearm crutch.   Consulted and Agree with Plan of Care Patient      Patient will benefit from skilled therapeutic intervention in order to improve the following deficits and impairments:  Abnormal gait, Decreased activity tolerance, Decreased balance, Decreased endurance, Decreased knowledge of use of DME, Decreased mobility, Impaired flexibility, Decreased strength, Postural dysfunction, Prosthetic Dependency,  Pain  Visit Diagnosis: Other abnormalities of gait and mobility  Unsteadiness on feet  Muscle weakness (generalized)  Other symptoms and signs involving the musculoskeletal system  Pain in left lower leg     Problem List Patient Active Problem List   Diagnosis Date Noted  . Diastolic dysfunction 71/06/2692  . Diabetic neuropathy ([place]) 11/10/2014  . Chronic kidney disease (CKD), stage IV (severe) ([place]) 11/10/2014  . Fracture of distal femur ([place]) 07/08/2014  . Osteoporosis 07/08/2014  . Femur fracture, left ([place]) 07/08/2014  . Colles' fracture of left radius   . Left hip pain 05/03/2014  . Hip pain   . Perimenopausal symptoms 02/13/2012  . Hyponatremia 12/29/2011  . Asthma exacerbation attacks 12/28/2011  . Bronchitis 12/28/2011  . Repeated falls 07/30/2011  . Hip fracture, left ([place]) 07/16/2011  . Femoral neck fracture ([place]) 07/16/2011  . Dyslipidemia 12/23/2008  . DIARRHEA 08/30/2008  . FATIGUE 07/12/2008  . THRUSH 05/20/2008  . Human immunodeficiency virus (HIV) disease ([place]) 04/19/2008  . Diabetes mellitus type 2 with complications, uncontrolled ([place]) 04/19/2008  . Mood disorder ([place]) 04/19/2008  . Hereditary and idiopathic peripheral neuropathy 04/19/2008  . Essential hypertension 04/19/2008    Jamey Reas PT, DPT 09/13/2015, 11:55 AM  [facility] [address] [place], [place], [address] Phone: [phone]   Fax:  [phone]  Name: [name] MRN: [id number] Date of Birth: 05/02/1965

## 2015-09-15 ENCOUNTER — Ambulatory Visit: Payer: Commercial Managed Care - HMO | Admitting: Physical Therapy

## 2015-09-15 ENCOUNTER — Encounter: Payer: Self-pay | Admitting: Physical Therapy

## 2015-09-15 ENCOUNTER — Ambulatory Visit: Payer: Commercial Managed Care - HMO | Admitting: Endocrinology

## 2015-09-15 DIAGNOSIS — M79662 Pain in left lower leg: Secondary | ICD-10-CM

## 2015-09-15 DIAGNOSIS — M6281 Muscle weakness (generalized): Secondary | ICD-10-CM

## 2015-09-15 DIAGNOSIS — R29898 Other symptoms and signs involving the musculoskeletal system: Secondary | ICD-10-CM

## 2015-09-15 DIAGNOSIS — R2681 Unsteadiness on feet: Secondary | ICD-10-CM

## 2015-09-15 DIAGNOSIS — R2689 Other abnormalities of gait and mobility: Secondary | ICD-10-CM | POA: Diagnosis not present

## 2015-09-15 NOTE — Therapy (Signed)
[facility] [address] [place], [place], [address] Phone: [phone]   Fax:  [phone]  Physical Therapy Treatment  Patient Details  Name: [name] MRN: [id number] Date of Birth: [birth year] Referring Provider: Frankey Shown, MD  Encounter Date: 09/15/2015      PT End of Session - 09/15/15 0814    Visit Number 24   Number of Visits 31   Date for PT Re-Evaluation 10/07/15   Authorization Type Medicare G-Code & progress report every 10 visits   PT Start Time 0805   PT Stop Time 6962  uncharged for 10 minutes due to pt overlap   PT Time Calculation (min) 50 min   Equipment Utilized During Treatment Gait belt   Activity Tolerance Patient limited by pain;Patient tolerated treatment well   Behavior During Therapy [facility] for tasks assessed/performed      Past Medical History:  Diagnosis Date  . Anxiety   . Arthritis    LEFT HIP  . Asthma    HOSPITALIZED WITH EXCERBATION OF ASTHMA - AND BRONCHITIS AND THE FLU DEC 2013  . Depression   . Diabetes mellitus    ON INSULIN AND ORAL MEDICATIONS  . GERD (gastroesophageal reflux disease)   . HIV infection ([place]) 2000  . Hypertension   . Neuropathy    NEUROPATHY HANDS AND FEET  . Pain    SEVERE PAIN LEFT HIP - HX OF LEFT HIP PINNING JULY 2013  . Shortness of breath    ALLERGIES ARE "ACTING UP"    Past Surgical History:  Procedure Laterality Date  . AMPUTATION Left 11/18/2014   Procedure: LEFT BELOW KNEE AMPUTATION;  Surgeon: Leandrew Koyanagi, MD;  Location: [facility];  Service: Orthopedics;  Laterality: Left;  . CAST APPLICATION Left 09/06/2839   Procedure: CAST APPLICATION (FIBERGLASS);  Surgeon: Latanya Maudlin, MD;  [facility];  Service: Orthopedics;  Laterality: Left;  CLOSED REDUCTION OF LEFT COLLES FRACTURE WITH SHORT ARM CAST  . HARDWARE REMOVAL Left 05/05/2014   Procedure: HARDWARE REMOVAL LEFT HIP;  Surgeon: Latanya Maudlin, MD;  [facility];  Service:  Orthopedics;  Laterality: Left;  REMOVAL BIOMET 6.5-8.0 CANNULATED SCREW  . HIP ARTHROPLASTY Left 05/05/2014   Procedure: ARTHROPLASTY OPEN REDUCTION INTERNAL FIXATION LEFT HIP AND REMOVAL OF TWO CANNULATED SCREW;  Surgeon: Latanya Maudlin, MD;  [facility];  Service: Orthopedics;  Laterality: Left;  . HIP PINNING,CANNULATED  07/16/2011   Procedure: CANNULATED HIP PINNING;  Surgeon: Gearlean Alf, MD;  [facility];  Service: Orthopedics;  Laterality: Left;  . HIP PINNING,CANNULATED Left 04/09/2012   Procedure: CANNULATED HIP PINNING AND HARDWARE REVISION;  Surgeon: Gearlean Alf, MD;  [facility];  Service: Orthopedics;  Laterality: Left;  . I&D EXTREMITY Left 11/16/2014   Procedure:  DEBRIDEMENT OF LEFT FOOT POSSIBLE APPLICATION OF INTEGRIA AND VAC ;  Surgeon: Irene Limbo, MD;  Location: [facility];  Service: Plastics;  Laterality: Left;  . PERIPHERAL VASCULAR CATHETERIZATION N/A 11/15/2014   Procedure: Abdominal Aortogram;  Surgeon: Conrad Bystrom, MD;  Location: [facility];  Service: Cardiovascular;  Laterality: N/A;  . PERIPHERAL VASCULAR CATHETERIZATION  11/15/2014   Procedure: Lower Extremity Angiography;  Surgeon: Conrad Brentwood, MD;  Location: [facility];  Service: Cardiovascular;;  . PERIPHERAL VASCULAR CATHETERIZATION Left 11/15/2014   Procedure: Peripheral Vascular Intervention;  Surgeon: Conrad Wormleysburg, MD;  Location: [facility];  Service: Cardiovascular;  Laterality: Left;  popliteal artery stenting  There were no vitals filed for this visit.      Subjective Assessment - 09/15/15 0811    Subjective No new complaints. No pain today. Does report a "small fall". Pt reports she was putting on leg after driving (had just pulled back into her driveway) and having trouble getting foot right. Scooted to edge of seat and slid down to ground. Was able to get leg on and get self back up. Denies any injuries.    Patient is accompained by: Family member    Pertinent History DM2, neuropathy, HIV, mood disorder, HTN, CKD (stage IV), asthma, arthritis, left hip fracture with ORIF 07/16/2011 and hardware removal with new pinning 05/05/2014   Limitations Lifting;Standing;Walking   Patient Stated Goals to use prosthesis to go in community including shopping, church, [place] meetings   Currently in Pain? No/denies   Pain Score 0-No pain             [facility] Adult PT Treatment/Exercise - 09/15/15 0815      Transfers   Transfers Sit to Stand;Stand to Sit   Sit to Stand 5: Supervision;With upper extremity assist;With armrests;From chair/3-in-1   Sit to Stand Details Verbal cues for precautions/safety   Stand to Sit 5: Supervision;With upper extremity assist;With armrests;To chair/3-in-1   Stand to Sit Details (indicate cue type and reason) Verbal cues for precautions/safety;Verbal cues for safe use of DME/AE     Ambulation/Gait   Ambulation/Gait Yes   Ambulation/Gait Assistance 5: Supervision;4: Min assist   Ambulation/Gait Assistance Details cues on posture, step length and rollator position with gait. cues on posture, sequencing and crutch position with use of forearm crutch with min HHA opposite crutch   Ambulation Distance (Feet) 180 Feet  x2, 36 x1   Assistive device Rollator;Prosthesis;R Forearm Crutch   Gait Pattern Step-through pattern;Decreased step length - right;Decreased stance time - left;Decreased step length - left;Decreased stride length;Trunk flexed;Lateral trunk lean to left;Abducted - left   Ambulation Surface Level;Indoor     Knee/Hip Exercises: Aerobic   Nustep all 4 extremeties level 5 x 10 mintues with goal >/= 45 for strengthening and activity tolerance. (uncharged activity due to performed at end of session and overlapped with next pt)     Prosthetics   Prosthetic Care Comments  use dermatology lotion for rash and lotion at night if no rash present.    Current prosthetic wear tolerance (days/week)  daily   Current prosthetic  wear tolerance (#hours/day)  all awake hours, drying as needed.    Residual limb condition  severe rash area under liner with multiple red circular raised areas. Appears to more than heat rash with dermatalogic rash. She reports MD has given her a cream to use when needed.    Education Provided Residual limb care;Skin check   Person(s) Educated Patient   Education Method Explanation;Demonstration;Verbal cues   Education Method Verbalized understanding;Needs further instruction   Donning Prosthesis Supervision   Doffing Prosthesis Supervision             PT Short Term Goals - 08/14/15 2305      PT SHORT TERM GOAL #1   Title Patient tolerates wear >90% of awake hours without tenderness or skin issues. (Target Date: 08/05/2015)   Baseline met on 08/04/15   Status Achieved     PT SHORT TERM GOAL #2   Title Berg Balance >/= 26/56 to indicate lower fall risk. (Target Date: 08/05/2015)   Baseline 08/11/15: met with score of 36/56   Status Achieved  PT SHORT TERM GOAL #3   Title Patient ambulates 150' with rollator walker including paved surfaces outside with no balance losses. (Target Date: 08/05/2015)   Baseline 08/04/15: pt able to go 150 feet, was on indoor surfaces only due to heat outside (stayed in due to pt's recent breathing issues with bronchitis/sinus infection)   Status Partially Met     PT SHORT TERM GOAL #4   Title Patient negotiates ramps & curbs with rollator walker & prosthesis with supervision. (Target Date: 08/05/2015)   Baseline 08/04/15- met    Period Weeks   Status Achieved     PT SHORT TERM GOAL #5   Title Patient reports pain increases <2 increments from baseline at start of session with standing & gait activities noted above. (Target Date: 08/05/2015)   Baseline 08/04/15: reported on only 1 increment pain increase with today's session.    Status Achieved           PT Long Term Goals - 09/13/15 1144      PT LONG TERM GOAL #1   Title Patient tolerates wear of  prosthesis >90% of awake hours without skin issues or limb tenderness. (NEW Target Date: 10/07/2015)   Baseline 09/13/2015 ongoing Pt is wearing prosthesis all awake hours with severe dermatologic rash.    Time 4   Period Weeks   Status On-going     PT LONG TERM GOAL #2   Title Patient verbalizes & demonstrates understanding of prosthetic care including sock ply management & skin management to enable safe use of prosthesis. (NEW Target Date: 10/07/2015)   Baseline 09/13/2015 ongoing pt has severe dermatologic rash   Time 4   Period Weeks   Status On-going     PT LONG TERM GOAL #3   Title Berg Balance > 36/56 to indicate lower fall risk.  (NEW Target Date: 10/07/2015)   Baseline 09/12/2015 Berg Balance 31/56   Time 4   Period Weeks   Status On-going     PT LONG TERM GOAL #4   Title Timed Up & Go <25 seconds with cane or single crutch modified independent safely.  (NEW Target Date: 10/07/2015)   Time 4   Period Weeks   Status On-going     PT LONG TERM GOAL #5   Title Patient ambulates 400' total with one seated rest using rollator walker & prosthesis modified independent to enable safe community mobility. (NEW Target Date: 10/07/2015)   Baseline 09/13/2015 progressing Pt ambulated 163' with rollator with supervision.    Time 4   Period Weeks   Status On-going     PT LONG TERM GOAL #6   Title Patient negotiates ramps & curbs with rollator walker & prosthesis modified indpendent to enable safe community mobility.  (NEW Target Date: 10/07/2015)   Time 4   Period Weeks   Status On-going            Plan - 09/15/15 0814    Clinical Impression Statement Today's skilled session continued to work on gait with emphasis on community distance with rollator and house hold distance with single forearm crutch. Pt was able to progress in both areas. Wounds/skin irritaiton on residual limb appears to be healing. Pt is making steady progress toward goals. Pt should benefit from continued PT to  progress toward unmet goals.                           Rehab Potential Good   PT Frequency  2x / week   PT Duration 4 weeks   PT Treatment/Interventions ADLs/Self Care Home Management;DME Instruction;Gait training;Stair training;Functional mobility training;Therapeutic activities;Therapeutic exercise;Balance training;Neuromuscular re-education;Patient/family education;Prosthetic Training;Vestibular   PT Next Visit Plan Continue with community gait with rollator/prosthesis and limited household with single forearm crutch, work on both static and dynamic standing balance activities. short distance (10-15') gait with single point cane/single forearm crutch.   Consulted and Agree with Plan of Care Patient      Patient will benefit from skilled therapeutic intervention in order to improve the following deficits and impairments:  Abnormal gait, Decreased activity tolerance, Decreased balance, Decreased endurance, Decreased knowledge of use of DME, Decreased mobility, Impaired flexibility, Decreased strength, Postural dysfunction, Prosthetic Dependency, Pain  Visit Diagnosis: Other abnormalities of gait and mobility  Unsteadiness on feet  Muscle weakness (generalized)  Other symptoms and signs involving the musculoskeletal system  Pain in left lower leg     Problem List Patient Active Problem List   Diagnosis Date Noted  . Diastolic dysfunction 75/64/3329  . Diabetic neuropathy ([place]) 11/10/2014  . Chronic kidney disease (CKD), stage IV (severe) ([place]) 11/10/2014  . Fracture of distal femur ([place]) 07/08/2014  . Osteoporosis 07/08/2014  . Femur fracture, left ([place]) 07/08/2014  . Colles' fracture of left radius   . Left hip pain 05/03/2014  . Hip pain   . Perimenopausal symptoms 02/13/2012  . Hyponatremia 12/29/2011  . Asthma exacerbation attacks 12/28/2011  . Bronchitis 12/28/2011  . Repeated falls 07/30/2011  . Hip fracture, left ([place]) 07/16/2011  . Femoral neck fracture ([place])  07/16/2011  . Dyslipidemia 12/23/2008  . DIARRHEA 08/30/2008  . FATIGUE 07/12/2008  . THRUSH 05/20/2008  . Human immunodeficiency virus (HIV) disease ([place]) 04/19/2008  . Diabetes mellitus type 2 with complications, uncontrolled ([place]) 04/19/2008  . Mood disorder ([place]) 04/19/2008  . Hereditary and idiopathic peripheral neuropathy 04/19/2008  . Essential hypertension 04/19/2008    Willow Ora, PTA, [facility] [address] [place], [place] [address] [phone] 09/15/15, 1:48 PM   Name: [name] MRN: [id number] Date of Birth: Feb 08, 1965

## 2015-09-20 ENCOUNTER — Ambulatory Visit: Payer: Commercial Managed Care - HMO | Admitting: Physical Therapy

## 2015-09-20 ENCOUNTER — Encounter: Payer: Self-pay | Admitting: Physical Therapy

## 2015-09-20 DIAGNOSIS — M6281 Muscle weakness (generalized): Secondary | ICD-10-CM | POA: Diagnosis not present

## 2015-09-20 DIAGNOSIS — R2681 Unsteadiness on feet: Secondary | ICD-10-CM | POA: Diagnosis not present

## 2015-09-20 DIAGNOSIS — R29898 Other symptoms and signs involving the musculoskeletal system: Secondary | ICD-10-CM | POA: Diagnosis not present

## 2015-09-20 DIAGNOSIS — R2689 Other abnormalities of gait and mobility: Secondary | ICD-10-CM

## 2015-09-20 DIAGNOSIS — M79662 Pain in left lower leg: Secondary | ICD-10-CM | POA: Diagnosis not present

## 2015-09-21 NOTE — Therapy (Signed)
[facility] [address] [place], [place], [address] Phone: [phone]   Fax:  [phone]  Physical Therapy Treatment  Patient Details  Name: [name] MRN: [id number] Date of Birth: 1965-04-21 Referring Provider: Frankey Shown, MD  Encounter Date: 09/20/2015      PT End of Session - 09/20/15 1630    Visit Number 25   Number of Visits 31   Date for PT Re-Evaluation 10/07/15   Authorization Type Medicare G-Code & progress report every 10 visits   PT Start Time 2409   PT Stop Time 1615   PT Time Calculation (min) 44 min   Equipment Utilized During Treatment Gait belt   Activity Tolerance Patient limited by pain;Patient tolerated treatment well   Behavior During Therapy [facility] for tasks assessed/performed      Past Medical History:  Diagnosis Date  . Anxiety   . Arthritis    LEFT HIP  . Asthma    HOSPITALIZED WITH EXCERBATION OF ASTHMA - AND BRONCHITIS AND THE FLU DEC 2013  . Depression   . Diabetes mellitus    ON INSULIN AND ORAL MEDICATIONS  . GERD (gastroesophageal reflux disease)   . HIV infection ([place]) 2000  . Hypertension   . Neuropathy    NEUROPATHY HANDS AND FEET  . Pain    SEVERE PAIN LEFT HIP - HX OF LEFT HIP PINNING JULY 2013  . Shortness of breath    ALLERGIES ARE "ACTING UP"    Past Surgical History:  Procedure Laterality Date  . AMPUTATION Left 11/18/2014   Procedure: LEFT BELOW KNEE AMPUTATION;  Surgeon: Leandrew Koyanagi, MD;  Location: [facility];  Service: Orthopedics;  Laterality: Left;  . CAST APPLICATION Left 07/04/5327   Procedure: CAST APPLICATION (FIBERGLASS);  Surgeon: Latanya Maudlin, MD;  [facility];  Service: Orthopedics;  Laterality: Left;  CLOSED REDUCTION OF LEFT COLLES FRACTURE WITH SHORT ARM CAST  . HARDWARE REMOVAL Left 05/05/2014   Procedure: HARDWARE REMOVAL LEFT HIP;  Surgeon: Latanya Maudlin, MD;  [facility];  Service: Orthopedics;  Laterality: Left;  REMOVAL BIOMET  6.5-8.0 CANNULATED SCREW  . HIP ARTHROPLASTY Left 05/05/2014   Procedure: ARTHROPLASTY OPEN REDUCTION INTERNAL FIXATION LEFT HIP AND REMOVAL OF TWO CANNULATED SCREW;  Surgeon: Latanya Maudlin, MD;  [facility];  Service: Orthopedics;  Laterality: Left;  . HIP PINNING,CANNULATED  07/16/2011   Procedure: CANNULATED HIP PINNING;  Surgeon: Gearlean Alf, MD;  [facility];  Service: Orthopedics;  Laterality: Left;  . HIP PINNING,CANNULATED Left 04/09/2012   Procedure: CANNULATED HIP PINNING AND HARDWARE REVISION;  Surgeon: Gearlean Alf, MD;  [facility];  Service: Orthopedics;  Laterality: Left;  . I&D EXTREMITY Left 11/16/2014   Procedure:  DEBRIDEMENT OF LEFT FOOT POSSIBLE APPLICATION OF INTEGRIA AND VAC ;  Surgeon: Irene Limbo, MD;  Location: [facility];  Service: Plastics;  Laterality: Left;  . PERIPHERAL VASCULAR CATHETERIZATION N/A 11/15/2014   Procedure: Abdominal Aortogram;  Surgeon: Conrad [place], MD;  Location: [facility];  Service: Cardiovascular;  Laterality: N/A;  . PERIPHERAL VASCULAR CATHETERIZATION  11/15/2014   Procedure: Lower Extremity Angiography;  Surgeon: Conrad [place], MD;  Location: [facility];  Service: Cardiovascular;;  . PERIPHERAL VASCULAR CATHETERIZATION Left 11/15/2014   Procedure: Peripheral Vascular Intervention;  Surgeon: Conrad [place], MD;  Location: [facility];  Service: Cardiovascular;  Laterality: Left;  popliteal artery stenting    There were no vitals filed for this  visit.      Subjective Assessment - 09/20/15 1534    Subjective She has been using RW outside the house & rollator walker in house.      Patient is accompained by: Family member   Pertinent History DM2, neuropathy, HIV, mood disorder, HTN, CKD (stage IV), asthma, arthritis, left hip fracture with ORIF 07/16/2011 and hardware removal with new pinning 05/05/2014   Limitations Lifting;Standing;Walking   Patient Stated Goals to use prosthesis to go in community  including shopping, church, [place] meetings   Currently in Pain? No/denies     Prosthetic Training with Transtibial Amputation: Residual limb has less redness to rash on limb. She verbalizes using dermatology cream on limb as PT recommended.  Patient ambulated 25' X2 with single forearm crutch with minA, verbal cues on posture, step width & step length. Patient ambulated 150' X 2 with RW with verbal cues on posture & manual cues on RW movement for consistent, fluent movement of LEs.  Neuromuscular Re-education for posture & balance: At counter top with intermittent & light UE support. Side stepping Rt & Lt with verbal cues on posture & wt shift. Backwards gait with crutch & counter with verbal cues. Side step with weight shift & overhead reach.  Trunk flexion to chair back to upright posture with hands on counter Initial trunk rotation stretch followed by rotation to look over shoulders.  Turning quarter turns clockwise & counterclockwise with BUE support.                              PT Short Term Goals - 08/14/15 2305      PT SHORT TERM GOAL #1   Title Patient tolerates wear >90% of awake hours without tenderness or skin issues. (Target Date: 08/05/2015)   Baseline met on 08/04/15   Status Achieved     PT SHORT TERM GOAL #2   Title Berg Balance >/= 26/56 to indicate lower fall risk. (Target Date: 08/05/2015)   Baseline 08/11/15: met with score of 36/56   Status Achieved     PT SHORT TERM GOAL #3   Title Patient ambulates 150' with rollator walker including paved surfaces outside with no balance losses. (Target Date: 08/05/2015)   Baseline 08/04/15: pt able to go 150 feet, was on indoor surfaces only due to heat outside (stayed in due to pt's recent breathing issues with bronchitis/sinus infection)   Status Partially Met     PT SHORT TERM GOAL #4   Title Patient negotiates ramps & curbs with rollator walker & prosthesis with supervision. (Target Date: 08/05/2015)    Baseline 08/04/15- met    Period Weeks   Status Achieved     PT SHORT TERM GOAL #5   Title Patient reports pain increases <2 increments from baseline at start of session with standing & gait activities noted above. (Target Date: 08/05/2015)   Baseline 08/04/15: reported on only 1 increment pain increase with today's session.    Status Achieved           PT Long Term Goals - 09/13/15 1144      PT LONG TERM GOAL #1   Title Patient tolerates wear of prosthesis >90% of awake hours without skin issues or limb tenderness. (NEW Target Date: 10/07/2015)   Baseline 09/13/2015 ongoing Pt is wearing prosthesis all awake hours with severe dermatologic rash.    Time 4   Period Weeks   Status On-going     PT  LONG TERM GOAL #2   Title Patient verbalizes & demonstrates understanding of prosthetic care including sock ply management & skin management to enable safe use of prosthesis. (NEW Target Date: 10/07/2015)   Baseline 09/13/2015 ongoing pt has severe dermatologic rash   Time 4   Period Weeks   Status On-going     PT LONG TERM GOAL #3   Title Berg Balance > 36/56 to indicate lower fall risk.  (NEW Target Date: 10/07/2015)   Baseline 09/12/2015 Berg Balance 31/56   Time 4   Period Weeks   Status On-going     PT LONG TERM GOAL #4   Title Timed Up & Go <25 seconds with cane or single crutch modified independent safely.  (NEW Target Date: 10/07/2015)   Time 4   Period Weeks   Status On-going     PT LONG TERM GOAL #5   Title Patient ambulates 400' total with one seated rest using rollator walker & prosthesis modified independent to enable safe community mobility. (NEW Target Date: 10/07/2015)   Baseline 09/13/2015 progressing Pt ambulated 163' with rollator with supervision.    Time 4   Period Weeks   Status On-going     PT LONG TERM GOAL #6   Title Patient negotiates ramps & curbs with rollator walker & prosthesis modified indpendent to enable safe community mobility.  (NEW Target Date: 10/07/2015)    Time 4   Period Weeks   Status On-going               Plan - 09/20/15 1630    Clinical Impression Statement Patient was challenged by balance activities at counter and required frequent rests but was able to continue after 2-3 minutes. Patient was able to balance better if she performs stretches for motion prior to balance.    Rehab Potential Good   PT Frequency 2x / week   PT Duration 4 weeks   PT Treatment/Interventions ADLs/Self Care Home Management;DME Instruction;Gait training;Stair training;Functional mobility training;Therapeutic activities;Therapeutic exercise;Balance training;Neuromuscular re-education;Patient/family education;Prosthetic Training;Vestibular   PT Next Visit Plan Continue with community gait with rollator/prosthesis and limited household with single forearm crutch, work on both static and dynamic standing balance activities. short distance (10-15') gait with single point cane/single forearm crutch.   Consulted and Agree with Plan of Care Patient      Patient will benefit from skilled therapeutic intervention in order to improve the following deficits and impairments:  Abnormal gait, Decreased activity tolerance, Decreased balance, Decreased endurance, Decreased knowledge of use of DME, Decreased mobility, Impaired flexibility, Decreased strength, Postural dysfunction, Prosthetic Dependency, Pain  Visit Diagnosis: Other abnormalities of gait and mobility  Unsteadiness on feet  Muscle weakness (generalized)  Other symptoms and signs involving the musculoskeletal system     Problem List Patient Active Problem List   Diagnosis Date Noted  . Diastolic dysfunction 62/83/1517  . Diabetic neuropathy ([place]) 11/10/2014  . Chronic kidney disease (CKD), stage IV (severe) ([place]) 11/10/2014  . Fracture of distal femur ([place]) 07/08/2014  . Osteoporosis 07/08/2014  . Femur fracture, left ([place]) 07/08/2014  . Colles' fracture of left radius   . Left hip pain  05/03/2014  . Hip pain   . Perimenopausal symptoms 02/13/2012  . Hyponatremia 12/29/2011  . Asthma exacerbation attacks 12/28/2011  . Bronchitis 12/28/2011  . Repeated falls 07/30/2011  . Hip fracture, left ([place]) 07/16/2011  . Femoral neck fracture ([place]) 07/16/2011  . Dyslipidemia 12/23/2008  . DIARRHEA 08/30/2008  . FATIGUE 07/12/2008  . THRUSH 05/20/2008  .  Human immunodeficiency virus (HIV) disease ([place]) 04/19/2008  . Diabetes mellitus type 2 with complications, uncontrolled ([place]) 04/19/2008  . Mood disorder ([place]) 04/19/2008  . Hereditary and idiopathic peripheral neuropathy 04/19/2008  . Essential hypertension 04/19/2008    Jamey Reas PT, DPT 09/21/2015, 6:36 AM  [facility] [address] [place], [place], [address] Phone: [phone]   Fax:  [phone]  Name: DAISHIA FETTERLY MRN: [id number] Date of Birth: 08-14-1965

## 2015-09-22 ENCOUNTER — Ambulatory Visit: Payer: Commercial Managed Care - HMO | Admitting: Physical Therapy

## 2015-09-22 ENCOUNTER — Encounter: Payer: Self-pay | Admitting: Physical Therapy

## 2015-09-22 DIAGNOSIS — M6281 Muscle weakness (generalized): Secondary | ICD-10-CM | POA: Diagnosis not present

## 2015-09-22 DIAGNOSIS — R2681 Unsteadiness on feet: Secondary | ICD-10-CM

## 2015-09-22 DIAGNOSIS — M79662 Pain in left lower leg: Secondary | ICD-10-CM | POA: Diagnosis not present

## 2015-09-22 DIAGNOSIS — R29898 Other symptoms and signs involving the musculoskeletal system: Secondary | ICD-10-CM

## 2015-09-22 DIAGNOSIS — R2689 Other abnormalities of gait and mobility: Secondary | ICD-10-CM | POA: Diagnosis not present

## 2015-09-23 NOTE — Therapy (Signed)
[name] [address] [place], [place], [address] Phone: [phone]   Fax:  [phone]  Physical Therapy Treatment  Patient Details  Name: [name] MRN: [id number] Date of Birth: [birth year] Referring Provider: Frankey Shown, MD  Encounter Date: 09/22/2015      PT End of Session - 09/22/15 1541    Visit Number 26   Number of Visits 31   Date for PT Re-Evaluation 10/07/15   Authorization Type Medicare G-Code & progress report every 10 visits   PT Start Time 1324  pt late for appt today   PT Stop Time 1615   PT Time Calculation (min) 39 min   Equipment Utilized During Treatment Gait belt   Activity Tolerance Patient limited by pain;Patient tolerated treatment well   Behavior During Therapy [facility] for tasks assessed/performed      Past Medical History:  Diagnosis Date  . Anxiety   . Arthritis    LEFT HIP  . Asthma    HOSPITALIZED WITH EXCERBATION OF ASTHMA - AND BRONCHITIS AND THE FLU DEC 2013  . Depression   . Diabetes mellitus    ON INSULIN AND ORAL MEDICATIONS  . GERD (gastroesophageal reflux disease)   . HIV infection ([place]) 2000  . Hypertension   . Neuropathy    NEUROPATHY HANDS AND FEET  . Pain    SEVERE PAIN LEFT HIP - HX OF LEFT HIP PINNING JULY 2013  . Shortness of breath    ALLERGIES ARE "ACTING UP"    Past Surgical History:  Procedure Laterality Date  . AMPUTATION Left 11/18/2014   Procedure: LEFT BELOW KNEE AMPUTATION;  Surgeon: Leandrew Koyanagi, MD;  Location: [facility];  Service: Orthopedics;  Laterality: Left;  . CAST APPLICATION Left 4/0/1027   Procedure: CAST APPLICATION (FIBERGLASS);  Surgeon: Latanya Maudlin, MD;  [facility];  Service: Orthopedics;  Laterality: Left;  CLOSED REDUCTION OF LEFT COLLES FRACTURE WITH SHORT ARM CAST  . HARDWARE REMOVAL Left 05/05/2014   Procedure: HARDWARE REMOVAL LEFT HIP;  Surgeon: Latanya Maudlin, MD;  [facility];  Service: Orthopedics;   Laterality: Left;  REMOVAL BIOMET 6.5-8.0 CANNULATED SCREW  . HIP ARTHROPLASTY Left 05/05/2014   Procedure: ARTHROPLASTY OPEN REDUCTION INTERNAL FIXATION LEFT HIP AND REMOVAL OF TWO CANNULATED SCREW;  Surgeon: Latanya Maudlin, MD;  [facility];  Service: Orthopedics;  Laterality: Left;  . HIP PINNING,CANNULATED  07/16/2011   Procedure: CANNULATED HIP PINNING;  Surgeon: Gearlean Alf, MD;  [facility];  Service: Orthopedics;  Laterality: Left;  . HIP PINNING,CANNULATED Left 04/09/2012   Procedure: CANNULATED HIP PINNING AND HARDWARE REVISION;  Surgeon: Gearlean Alf, MD;  [facility];  Service: Orthopedics;  Laterality: Left;  . I&D EXTREMITY Left 11/16/2014   Procedure:  DEBRIDEMENT OF LEFT FOOT POSSIBLE APPLICATION OF INTEGRIA AND VAC ;  Surgeon: Irene Limbo, MD;  Location: [facility];  Service: Plastics;  Laterality: Left;  . PERIPHERAL VASCULAR CATHETERIZATION N/A 11/15/2014   Procedure: Abdominal Aortogram;  Surgeon: Conrad [place], MD;  Location: [facility];  Service: Cardiovascular;  Laterality: N/A;  . PERIPHERAL VASCULAR CATHETERIZATION  11/15/2014   Procedure: Lower Extremity Angiography;  Surgeon: Conrad Paris, MD;  Location: [place] [facility];  Service: Cardiovascular;;  . PERIPHERAL VASCULAR CATHETERIZATION Left 11/15/2014   Procedure: Peripheral Vascular Intervention;  Surgeon: Conrad [place], MD;  Location: [facility];  Service: Cardiovascular;  Laterality: Left;  popliteal artery stenting    There  were no vitals filed for this visit.      Subjective Assessment - 09/22/15 1540    Subjective "I am so tired I could just drop">    Patient is accompained by: Family member   Pertinent History DM2, neuropathy, HIV, mood disorder, HTN, CKD (stage IV), asthma, arthritis, left hip fracture with ORIF 07/16/2011 and hardware removal with new pinning 05/05/2014   Limitations Lifting;Standing;Walking   Currently in Pain? Yes   Pain Score 7    Pain Location Knee    Pain Orientation Left   Pain Descriptors / Indicators Aching;Sore   Pain Onset More than a month ago   Pain Frequency Constant   Aggravating Factors  weather, increased activity   Pain Relieving Factors resting            [facility] Adult PT Treatment/Exercise - 09/22/15 1543      Transfers   Transfers Sit to Stand;Stand to Sit   Sit to Stand 5: Supervision;With upper extremity assist;With armrests;From chair/3-in-1   Sit to Stand Details Verbal cues for precautions/safety;Verbal cues for safe use of DME/AE   Stand to Sit 5: Supervision;With upper extremity assist;With armrests;To chair/3-in-1   Stand to Sit Details (indicate cue type and reason) Verbal cues for precautions/safety;Verbal cues for safe use of DME/AE     Ambulation/Gait   Ambulation/Gait Yes   Ambulation/Gait Assistance 5: Supervision   Ambulation Distance (Feet) 120 Feet  x1, 150 x1, 30 x2   Assistive device Rollator   Gait Pattern Step-through pattern;Decreased step length - right;Decreased stance time - left;Decreased step length - left;Decreased stride length;Trunk flexed;Lateral trunk lean to left;Abducted - left   Ambulation Surface Level;Indoor     High Level Balance   High Level Balance Activities Side stepping;Backward walking   High Level Balance Comments x 4 laps each in parallel bars, min guard assist with cues on form/technique     Neuro Re-ed    Neuro Re-ed Details  in parallel bars: holding onto bars with both hands, alternating lateral stepping with same side reaching out to side, x 10 reps each side. min guard assist with cues on posture/ex form     Prosthetics   Current prosthetic wear tolerance (days/week)  daily   Current prosthetic wear tolerance (#hours/day)  all awake hours, drying as needed.    Residual limb condition  heat rash is clearing up/getting better   Education Provided Residual limb care;Skin check   Person(s) Educated Patient   Education Method Explanation;Demonstration;Verbal  cues   Education Method Verbalized understanding   Donning Prosthesis Supervision   Doffing Prosthesis Supervision             PT Short Term Goals - 08/14/15 2305      PT SHORT TERM GOAL #1   Title Patient tolerates wear >90% of awake hours without tenderness or skin issues. (Target Date: 08/05/2015)   Baseline met on 08/04/15   Status Achieved     PT SHORT TERM GOAL #2   Title Berg Balance >/= 26/56 to indicate lower fall risk. (Target Date: 08/05/2015)   Baseline 08/11/15: met with score of 36/56   Status Achieved     PT SHORT TERM GOAL #3   Title Patient ambulates 150' with rollator walker including paved surfaces outside with no balance losses. (Target Date: 08/05/2015)   Baseline 08/04/15: pt able to go 150 feet, was on indoor surfaces only due to heat outside (stayed in due to pt's recent breathing issues with bronchitis/sinus infection)   Status  Partially Met     PT SHORT TERM GOAL #4   Title Patient negotiates ramps & curbs with rollator walker & prosthesis with supervision. (Target Date: 08/05/2015)   Baseline 08/04/15- met    Period Weeks   Status Achieved     PT SHORT TERM GOAL #5   Title Patient reports pain increases <2 increments from baseline at start of session with standing & gait activities noted above. (Target Date: 08/05/2015)   Baseline 08/04/15: reported on only 1 increment pain increase with today's session.    Status Achieved           PT Long Term Goals - 09/13/15 1144      PT LONG TERM GOAL #1   Title Patient tolerates wear of prosthesis >90% of awake hours without skin issues or limb tenderness. (NEW Target Date: 10/07/2015)   Baseline 09/13/2015 ongoing Pt is wearing prosthesis all awake hours with severe dermatologic rash.    Time 4   Period Weeks   Status On-going     PT LONG TERM GOAL #2   Title Patient verbalizes & demonstrates understanding of prosthetic care including sock ply management & skin management to enable safe use of prosthesis. (NEW  Target Date: 10/07/2015)   Baseline 09/13/2015 ongoing pt has severe dermatologic rash   Time 4   Period Weeks   Status On-going     PT LONG TERM GOAL #3   Title Berg Balance > 36/56 to indicate lower fall risk.  (NEW Target Date: 10/07/2015)   Baseline 09/12/2015 Berg Balance 31/56   Time 4   Period Weeks   Status On-going     PT LONG TERM GOAL #4   Title Timed Up & Go <25 seconds with cane or single crutch modified independent safely.  (NEW Target Date: 10/07/2015)   Time 4   Period Weeks   Status On-going     PT LONG TERM GOAL #5   Title Patient ambulates 400' total with one seated rest using rollator walker & prosthesis modified independent to enable safe community mobility. (NEW Target Date: 10/07/2015)   Baseline 09/13/2015 progressing Pt ambulated 163' with rollator with supervision.    Time 4   Period Weeks   Status On-going     PT LONG TERM GOAL #6   Title Patient negotiates ramps & curbs with rollator walker & prosthesis modified indpendent to enable safe community mobility.  (NEW Target Date: 10/07/2015)   Time 4   Period Weeks   Status On-going            Plan - 09/22/15 1542    Clinical Impression Statement Today's session continued to address gait distance with rollator and standing balance activities with decreased UE reliance. Pt with increased back pain with activity, needing rest breaks. Pt is making steady progress toward goals and should benefit from continued PT to progress toward unmet goals.,    Rehab Potential Good   PT Frequency 2x / week   PT Duration 4 weeks   PT Treatment/Interventions ADLs/Self Care Home Management;DME Instruction;Gait training;Stair training;Functional mobility training;Therapeutic activities;Therapeutic exercise;Balance training;Neuromuscular re-education;Patient/family education;Prosthetic Training;Vestibular   PT Next Visit Plan Continue with community gait with rollator/prosthesis and limited household with single forearm crutch,  work on both static and dynamic standing balance activities. short distance (10-15') gait with single point cane/single forearm crutch.   Consulted and Agree with Plan of Care Patient      Patient will benefit from skilled therapeutic intervention in order to improve the following  deficits and impairments:  Abnormal gait, Decreased activity tolerance, Decreased balance, Decreased endurance, Decreased knowledge of use of DME, Decreased mobility, Impaired flexibility, Decreased strength, Postural dysfunction, Prosthetic Dependency, Pain  Visit Diagnosis: Other abnormalities of gait and mobility  Unsteadiness on feet  Muscle weakness (generalized)  Other symptoms and signs involving the musculoskeletal system  Pain in left lower leg     Problem List Patient Active Problem List   Diagnosis Date Noted  . Diastolic dysfunction 82/41/7530  . Diabetic neuropathy ([place]) 11/10/2014  . Chronic kidney disease (CKD), stage IV (severe) ([place]) 11/10/2014  . Fracture of distal femur ([place]) 07/08/2014  . Osteoporosis 07/08/2014  . Femur fracture, left ([place]) 07/08/2014  . Colles' fracture of left radius   . Left hip pain 05/03/2014  . Hip pain   . Perimenopausal symptoms 02/13/2012  . Hyponatremia 12/29/2011  . Asthma exacerbation attacks 12/28/2011  . Bronchitis 12/28/2011  . Repeated falls 07/30/2011  . Hip fracture, left ([employer]) 07/16/2011  . Femoral neck fracture ([place]) 07/16/2011  . Dyslipidemia 12/23/2008  . DIARRHEA 08/30/2008  . FATIGUE 07/12/2008  . THRUSH 05/20/2008  . Human immunodeficiency virus (HIV) disease ([place]) 04/19/2008  . Diabetes mellitus type 2 with complications, uncontrolled ([place]) 04/19/2008  . Mood disorder ([place]) 04/19/2008  . Hereditary and idiopathic peripheral neuropathy 04/19/2008  . Essential hypertension 04/19/2008    Willow Ora, PTA, [facility] [address] [place], [place] [address] [phone] 09/23/15, 1:12 PM     Name: LOVA URBIETA MRN: [id number] Date of Birth: 07-Sep-1965

## 2015-09-27 ENCOUNTER — Ambulatory Visit: Payer: Commercial Managed Care - HMO | Admitting: Physical Therapy

## 2015-09-30 ENCOUNTER — Encounter: Payer: Self-pay | Admitting: Physical Therapy

## 2015-09-30 ENCOUNTER — Other Ambulatory Visit (INDEPENDENT_AMBULATORY_CARE_PROVIDER_SITE_OTHER): Payer: Commercial Managed Care - HMO

## 2015-09-30 ENCOUNTER — Ambulatory Visit: Payer: Commercial Managed Care - HMO | Admitting: Physical Therapy

## 2015-09-30 DIAGNOSIS — R2689 Other abnormalities of gait and mobility: Secondary | ICD-10-CM | POA: Diagnosis not present

## 2015-09-30 DIAGNOSIS — R2681 Unsteadiness on feet: Secondary | ICD-10-CM | POA: Diagnosis not present

## 2015-09-30 DIAGNOSIS — Z794 Long term (current) use of insulin: Secondary | ICD-10-CM

## 2015-09-30 DIAGNOSIS — M6281 Muscle weakness (generalized): Secondary | ICD-10-CM | POA: Diagnosis not present

## 2015-09-30 DIAGNOSIS — E1165 Type 2 diabetes mellitus with hyperglycemia: Secondary | ICD-10-CM

## 2015-09-30 DIAGNOSIS — M79662 Pain in left lower leg: Secondary | ICD-10-CM | POA: Diagnosis not present

## 2015-09-30 DIAGNOSIS — R29898 Other symptoms and signs involving the musculoskeletal system: Secondary | ICD-10-CM | POA: Diagnosis not present

## 2015-09-30 LAB — COMPREHENSIVE METABOLIC PANEL
ALBUMIN: 3.7 g/dL (ref 3.5–5.2)
ALK PHOS: 57 U/L (ref 39–117)
ALT: 17 U/L (ref 0–35)
AST: 18 U/L (ref 0–37)
BUN: 31 mg/dL — AB (ref 6–23)
CALCIUM: 9 mg/dL (ref 8.4–10.5)
CO2: 34 mEq/L — ABNORMAL HIGH (ref 19–32)
Chloride: 101 mEq/L (ref 96–112)
Creatinine, Ser: 2.01 mg/dL — ABNORMAL HIGH (ref 0.40–1.20)
GFR: 27.7 mL/min — ABNORMAL LOW (ref >60.00)
Glucose, Bld: 122 mg/dL — ABNORMAL HIGH (ref 70–99)
POTASSIUM: 4.6 meq/L (ref 3.5–5.1)
SODIUM: 141 meq/L (ref 135–145)
Total Bilirubin: 0.3 mg/dL (ref 0.2–1.2)
Total Protein: 7.1 g/dL (ref 6.0–8.3)

## 2015-09-30 LAB — HEMOGLOBIN A1C: Hgb A1c MFr Bld: 8.2 % — ABNORMAL HIGH (ref 4.6–6.5)

## 2015-09-30 NOTE — Therapy (Signed)
[facility] [address] [place], [place], [address] Phone: [phone]   Fax:  [phone]  Physical Therapy Treatment  Patient Details  Name: [name] MRN: [id number] Date of Birth: 11/02/1965 Referring Provider: Frankey Shown, MD  Encounter Date: 09/30/2015      PT End of Session - 09/30/15 1414    Visit Number 27   Number of Visits 31   Date for PT Re-Evaluation 10/07/15   Authorization Type Medicare G-Code & progress report every 10 visits   PT Start Time 2336   PT Stop Time 1445   PT Time Calculation (min) 42 min   Equipment Utilized During Treatment Gait belt   Activity Tolerance Patient limited by pain;Patient tolerated treatment well   Behavior During Therapy [facility] for tasks assessed/performed      Past Medical History:  Diagnosis Date  . Anxiety   . Arthritis    LEFT HIP  . Asthma    HOSPITALIZED WITH EXCERBATION OF ASTHMA - AND BRONCHITIS AND THE FLU DEC 2013  . Depression   . Diabetes mellitus    ON INSULIN AND ORAL MEDICATIONS  . GERD (gastroesophageal reflux disease)   . HIV infection ([place]) 2000  . Hypertension   . Neuropathy    NEUROPATHY HANDS AND FEET  . Pain    SEVERE PAIN LEFT HIP - HX OF LEFT HIP PINNING JULY 2013  . Shortness of breath    ALLERGIES ARE "ACTING UP"    Past Surgical History:  Procedure Laterality Date  . AMPUTATION Left 11/18/2014   Procedure: LEFT BELOW KNEE AMPUTATION;  Surgeon: Leandrew Koyanagi, MD;  Location: [facility];  Service: Orthopedics;  Laterality: Left;  . CAST APPLICATION Left 01/03/2447   Procedure: CAST APPLICATION (FIBERGLASS);  Surgeon: Latanya Maudlin, MD;  [facility];  Service: Orthopedics;  Laterality: Left;  CLOSED REDUCTION OF LEFT COLLES FRACTURE WITH SHORT ARM CAST  . HARDWARE REMOVAL Left 05/05/2014   Procedure: HARDWARE REMOVAL LEFT HIP;  Surgeon: Latanya Maudlin, MD;  [facility];  Service: Orthopedics;  Laterality: Left;  REMOVAL BIOMET  6.5-8.0 CANNULATED SCREW  . HIP ARTHROPLASTY Left 05/05/2014   Procedure: ARTHROPLASTY OPEN REDUCTION INTERNAL FIXATION LEFT HIP AND REMOVAL OF TWO CANNULATED SCREW;  Surgeon: Latanya Maudlin, MD;  [facility];  Service: Orthopedics;  Laterality: Left;  . HIP PINNING,CANNULATED  07/16/2011   Procedure: CANNULATED HIP PINNING;  Surgeon: Gearlean Alf, MD;  [facility];  Service: Orthopedics;  Laterality: Left;  . HIP PINNING,CANNULATED Left 04/09/2012   Procedure: CANNULATED HIP PINNING AND HARDWARE REVISION;  Surgeon: Gearlean Alf, MD;  [facility];  Service: Orthopedics;  Laterality: Left;  . I&D EXTREMITY Left 11/16/2014   Procedure:  DEBRIDEMENT OF LEFT FOOT POSSIBLE APPLICATION OF INTEGRIA AND VAC ;  Surgeon: Irene Limbo, MD;  Location: [facility];  Service: Plastics;  Laterality: Left;  . PERIPHERAL VASCULAR CATHETERIZATION N/A 11/15/2014   Procedure: Abdominal Aortogram;  Surgeon: Conrad [place], MD;  Location: [facility];  Service: Cardiovascular;  Laterality: N/A;  . PERIPHERAL VASCULAR CATHETERIZATION  11/15/2014   Procedure: Lower Extremity Angiography;  Surgeon: Conrad Liverpool, MD;  Location: [facility];  Service: Cardiovascular;;  . PERIPHERAL VASCULAR CATHETERIZATION Left 11/15/2014   Procedure: Peripheral Vascular Intervention;  Surgeon: Conrad Stanly, MD;  Location: [facility];  Service: Cardiovascular;  Laterality: Left;  popliteal artery stenting    There were no vitals filed for this  visit.      Subjective Assessment - 09/30/15 1407    Subjective "I had a fall". Happend on Tuesday. Pt reports she had been sitting at the table coloring and her mom suggested she take a break and walk with her crutch. Pt reports she took 2 steps and fell to the right. Did hit her head and has had a mild headache since (pt states she feels the headache is more due to stress and her friend Abigail Butts than the fall). Now with increased pain in right elbow and hip, along  with back. Has not seen anyone for check out since fall.  Does have an appt with Dr. Dwyane Dee today and will make him aware of everything that has happended. Ice helps temporarily. Unsure of what caused the fall.              Patient is accompained by: Family member   Pertinent History DM2, neuropathy, HIV, mood disorder, HTN, CKD (stage IV), asthma, arthritis, left hip fracture with ORIF 07/16/2011 and hardware removal with new pinning 05/05/2014   Limitations Lifting;Standing;Walking   Patient Stated Goals to use prosthesis to go in community including shopping, church, [employer] meetings   Currently in Pain? Yes   Pain Score 7    Pain Location Generalized  right elbow, hip and back   Pain Descriptors / Indicators Aching;Sharp;Sore;Tender   Pain Type Chronic pain   Pain Onset More than a month ago   Pain Frequency Constant   Aggravating Factors  recent fall, increased activity, weather   Pain Relieving Factors resting.             [facility] Adult PT Treatment/Exercise - 09/30/15 1415      Transfers   Transfers Sit to Stand;Stand to Sit   Sit to Stand 5: Supervision;With upper extremity assist;With armrests;From chair/3-in-1   Stand to Sit 5: Supervision;With upper extremity assist;With armrests;To chair/3-in-1     Ambulation/Gait   Ambulation/Gait Yes   Ambulation/Gait Assistance 5: Supervision   Ambulation/Gait Assistance Details cues on posture and step length.    Ambulation Distance (Feet) 120 Feet   Assistive device Rollator   Gait Pattern Step-through pattern;Decreased step length - right;Decreased stance time - left;Decreased step length - left;Decreased stride length;Trunk flexed;Lateral trunk lean to left;Abducted - left   Ambulation Surface Level;Indoor     High Level Balance   High Level Balance Activities Side stepping;Backward walking;Marching forwards   High Level Balance Comments in parallel bars: 4 laps each with UE support, min guard assist to supervision. cues for upright  posture, weight shifting and ex form/technique.      Prosthetics   Current prosthetic wear tolerance (days/week)  daily   Current prosthetic wear tolerance (#hours/day)  all awake hours, drying as needed.    Residual limb condition  intact with no issues, heat rash has cleared up   Education Provided Residual limb care;Skin check   Person(s) Educated Patient   Education Method Demonstration;Explanation   Education Method Verbalized understanding   Donning Prosthesis Supervision   Doffing Prosthesis Supervision              PT Short Term Goals - 08/14/15 2305      PT SHORT TERM GOAL #1   Title Patient tolerates wear >90% of awake hours without tenderness or skin issues. (Target Date: 08/05/2015)   Baseline met on 08/04/15   Status Achieved     PT SHORT TERM GOAL #2   Title Berg Balance >/= 26/56 to indicate lower  fall risk. (Target Date: 08/05/2015)   Baseline 08/11/15: met with score of 36/56   Status Achieved     PT SHORT TERM GOAL #3   Title Patient ambulates 150' with rollator walker including paved surfaces outside with no balance losses. (Target Date: 08/05/2015)   Baseline 08/04/15: pt able to go 150 feet, was on indoor surfaces only due to heat outside (stayed in due to pt's recent breathing issues with bronchitis/sinus infection)   Status Partially Met     PT SHORT TERM GOAL #4   Title Patient negotiates ramps & curbs with rollator walker & prosthesis with supervision. (Target Date: 08/05/2015)   Baseline 08/04/15- met    Period Weeks   Status Achieved     PT SHORT TERM GOAL #5   Title Patient reports pain increases <2 increments from baseline at start of session with standing & gait activities noted above. (Target Date: 08/05/2015)   Baseline 08/04/15: reported on only 1 increment pain increase with today's session.    Status Achieved           PT Long Term Goals - 09/13/15 1144      PT LONG TERM GOAL #1   Title Patient tolerates wear of prosthesis >90% of awake hours  without skin issues or limb tenderness. (NEW Target Date: 10/07/2015)   Baseline 09/13/2015 ongoing Pt is wearing prosthesis all awake hours with severe dermatologic rash.    Time 4   Period Weeks   Status On-going     PT LONG TERM GOAL #2   Title Patient verbalizes & demonstrates understanding of prosthetic care including sock ply management & skin management to enable safe use of prosthesis. (NEW Target Date: 10/07/2015)   Baseline 09/13/2015 ongoing pt has severe dermatologic rash   Time 4   Period Weeks   Status On-going     PT LONG TERM GOAL #3   Title Berg Balance > 36/56 to indicate lower fall risk.  (NEW Target Date: 10/07/2015)   Baseline 09/12/2015 Berg Balance 31/56   Time 4   Period Weeks   Status On-going     PT LONG TERM GOAL #4   Title Timed Up & Go <25 seconds with cane or single crutch modified independent safely.  (NEW Target Date: 10/07/2015)   Time 4   Period Weeks   Status On-going     PT LONG TERM GOAL #5   Title Patient ambulates 400' total with one seated rest using rollator walker & prosthesis modified independent to enable safe community mobility. (NEW Target Date: 10/07/2015)   Baseline 09/13/2015 progressing Pt ambulated 163' with rollator with supervision.    Time 4   Period Weeks   Status On-going     PT LONG TERM GOAL #6   Title Patient negotiates ramps & curbs with rollator walker & prosthesis modified indpendent to enable safe community mobility.  (NEW Target Date: 10/07/2015)   Time 4   Period Weeks   Status On-going          Plan - 09/30/15 1415    Clinical Impression Statement PT in clinic made aware of pt's fall and symptoms Billie Ruddy, PT, DPT). She checked out pt and felt it was okay to proceed with today with pt following up with MD today. Today's session continued to focus on mobility with rollator and balance, both with prosthesis. Limited by fatigue and back pain with pt needing short rest breaks throughout the session. Pt is making  slow, steady progress toward goals.  Pt should benefit from continued PT to progress toward unmet goals.                  Rehab Potential Good   PT Frequency 2x / week   PT Duration 4 weeks   PT Treatment/Interventions ADLs/Self Care Home Management;DME Instruction;Gait training;Stair training;Functional mobility training;Therapeutic activities;Therapeutic exercise;Balance training;Neuromuscular re-education;Patient/family education;Prosthetic Training;Vestibular   PT Next Visit Plan assess LTGs.   Consulted and Agree with Plan of Care Patient      Patient will benefit from skilled therapeutic intervention in order to improve the following deficits and impairments:  Abnormal gait, Decreased activity tolerance, Decreased balance, Decreased endurance, Decreased knowledge of use of DME, Decreased mobility, Impaired flexibility, Decreased strength, Postural dysfunction, Prosthetic Dependency, Pain  Visit Diagnosis: Other abnormalities of gait and mobility  Unsteadiness on feet  Muscle weakness (generalized)  Other symptoms and signs involving the musculoskeletal system  Pain in left lower leg     Problem List Patient Active Problem List   Diagnosis Date Noted  . Diastolic dysfunction 88/33/7445  . Diabetic neuropathy ([place]) 11/10/2014  . Chronic kidney disease (CKD), stage IV (severe) ([place]) 11/10/2014  . Fracture of distal femur ([place]) 07/08/2014  . Osteoporosis 07/08/2014  . Femur fracture, left ([place]) 07/08/2014  . Colles' fracture of left radius   . Left hip pain 05/03/2014  . Hip pain   . Perimenopausal symptoms 02/13/2012  . Hyponatremia 12/29/2011  . Asthma exacerbation attacks 12/28/2011  . Bronchitis 12/28/2011  . Repeated falls 07/30/2011  . Hip fracture, left ([place]) 07/16/2011  . Femoral neck fracture ([place]) 07/16/2011  . Dyslipidemia 12/23/2008  . DIARRHEA 08/30/2008  . FATIGUE 07/12/2008  . THRUSH 05/20/2008  . Human immunodeficiency virus (HIV) disease ([place])  04/19/2008  . Diabetes mellitus type 2 with complications, uncontrolled ([place]) 04/19/2008  . Mood disorder ([place]) 04/19/2008  . Hereditary and idiopathic peripheral neuropathy 04/19/2008  . Essential hypertension 04/19/2008    Willow Ora, PTA, [facility] [address], [place], [place] [address] [phone] 09/30/15, 4:27 PM   Name: [name] MRN: [id number] Date of Birth: [birth year]

## 2015-10-04 ENCOUNTER — Encounter: Payer: Self-pay | Admitting: Physical Therapy

## 2015-10-04 ENCOUNTER — Ambulatory Visit: Payer: Commercial Managed Care - HMO | Attending: Orthopaedic Surgery | Admitting: Physical Therapy

## 2015-10-04 DIAGNOSIS — R2681 Unsteadiness on feet: Secondary | ICD-10-CM | POA: Insufficient documentation

## 2015-10-04 DIAGNOSIS — M6281 Muscle weakness (generalized): Secondary | ICD-10-CM | POA: Diagnosis not present

## 2015-10-04 DIAGNOSIS — R29898 Other symptoms and signs involving the musculoskeletal system: Secondary | ICD-10-CM

## 2015-10-04 DIAGNOSIS — R2689 Other abnormalities of gait and mobility: Secondary | ICD-10-CM | POA: Diagnosis not present

## 2015-10-05 ENCOUNTER — Ambulatory Visit (INDEPENDENT_AMBULATORY_CARE_PROVIDER_SITE_OTHER): Payer: Commercial Managed Care - HMO | Admitting: Endocrinology

## 2015-10-05 ENCOUNTER — Encounter: Payer: Self-pay | Admitting: Endocrinology

## 2015-10-05 VITALS — BP 131/78 | HR 80 | Ht 70.0 in | Wt 266.0 lb

## 2015-10-05 DIAGNOSIS — E1165 Type 2 diabetes mellitus with hyperglycemia: Secondary | ICD-10-CM | POA: Diagnosis not present

## 2015-10-05 DIAGNOSIS — Z794 Long term (current) use of insulin: Secondary | ICD-10-CM | POA: Diagnosis not present

## 2015-10-05 NOTE — Progress Notes (Signed)
Patient ID: [name], female   DOB: October 17, 1965, 50 y.o.   MRN: [id number]           Reason for Appointment: Follow-up  for Type 2 Diabetes   History of Present Illness:          Date of diagnosis of type 2 diabetes mellitus: 2000       Background history:     The patient was apparently diagnosed incidentally with her diabetes 16 years ago 16 years ago, was asymptomatic. She took metformin and possibly other oral hypoglycemic drugs but subsequently went on insulin in 2005 She was taking various insulin regimens in the past  She was being treated by an endocrinologist some time ago and he switched her To Toujeo and also tried her on Byetta.  She thinks that she had better blood sugars with Byetta but she did not follow-up  Novolog was switched to U-500 insulin in 3/17   Recent history:   INSULIN regimen is:  Toujeo 70 units qd  Humulin R U-500: 30 units before breakfast, 50 units before lunch and 40 before supper NovoLog 20 units at lunch Non-insulin hypoglycemic drugs the patient is taking are: Victoza 1.8mg     Her A1c is 8.2, now relatively higher than the previous one of 7.9   Current management, blood sugar patterns and problems identified:  She is checking her blood sugar very infrequently and none in the last 3 weeks because of not finding a battery for her monitor  She has variable meal times and difficult to judge which of her readings are before or after meals  NOVOLOG was supposed to be stopped when she started to U-500 insulin at meals but for some reason she is still taking 20 units at lunchtime  She is not able to follow her diet and eating snacks and high fat foods like potato chips  She thinks she is afraid of getting low sugars after lunch and tends to eat more.  With her blood sugars from last month highest readings are between about 4 PM-7 PM, otherwise they are below 200  She is usually not checking readings fasting, glucose in the lab fasting was  about 122  She usually is not having hypoglycemia although she thinks she may sometimes feel hypoglycemic at 5 PM  Blood sugars BEFORE dinnertime are looking relatively good  Not able to take metformin because of renal dysfunction    Side effects from medications have been: None  Compliance with the medical regimen:  fair Hypoglycemia:  rarely low at 4-6pm Glucose monitoring:  done  times a day         Glucometer: One Touch Verio.      Blood Glucose readings by download  Mean values apply above for all meters except median for One Touch  PRE-MEAL Fasting Lunch Dinner Bedtime Overall  Glucose range: 58      Mean/median:     177   POST-MEAL PC Breakfast PC Lunch PC Dinner  Glucose range:     Mean/median:        Mean values apply above for all meters except median for One Touch  PRE-MEAL Fasting Lunch Dinner Bedtime Overall  Glucose range: 87--225 127, 190  95-357  56-363    Mean/median:     178+/-84     Self-care: The diet that the patient has been following is: tries to limit Fat intake .        Typical meal intake: Breakfast is  yogurt or  oatmeal.    Lunch is variable at 3- 4 pm, has mixed meal at dinnertime, 10 pm usually avoiding fried food.   Has variable amounts of snacks.  She is avoiding drinks with sugar                Dietician visit, most recent: [facility]               Exercise:  none  Weight history:  Wt Readings from Last 3 Encounters:  10/05/15 266 lb (120.7 kg)  06/13/15 266 lb (120.7 kg)  11/22/14 248 lb 1.6 oz (112.5 kg)    Glycemic control: 11 in 8/16   Lab Results  Component Value Date   HGBA1C 8.2 (H) 09/30/2015   HGBA1C 7.9 06/14/2015   HGBA1C 10.7 (H) 11/10/2014   Lab Results  Component Value Date   MICROALBUR <0.7 03/16/2015   LDLCALC 82 08/04/2015   CREATININE 2.01 (H) 09/30/2015    Other active problems: See review of systems      Medication List       Accurate as of 10/05/15  9:01 PM. Always use your most recent med  list.          ARIPiprazole 10 MG tablet Commonly known as:  ABILIFY Take 10 mg by mouth daily.   aspirin 325 MG EC tablet Take 1 tablet (325 mg total) by mouth daily.   budesonide-formoterol 80-4.5 MCG/ACT inhaler Commonly known as:  SYMBICORT Inhale 2 puffs into the lungs 2 (two) times daily.   clonazePAM 0.5 MG tablet Commonly known as:  KLONOPIN Take 0.5 tablets (0.25 mg total) by mouth 3 (three) times daily as needed (anxiety).   cyclobenzaprine 5 MG tablet Commonly known as:  FLEXERIL Take 2 tablets (10 mg total) by mouth 3 (three) times daily as needed for muscle spasms.   diphenoxylate-atropine 2.5-0.025 MG tablet Commonly known as:  LOMOTIL Take 1 tablet by mouth 4 (four) times daily as needed for diarrhea or loose stools.   escitalopram 20 MG tablet Commonly known as:  LEXAPRO Take 20 mg by mouth daily.   eszopiclone 2 MG Tabs tablet Commonly known as:  LUNESTA Take 3 mg by mouth at bedtime as needed for sleep. Take immediately before bedtime   feeding supplement (GLUCERNA SHAKE) Liqd Take 237 mLs by mouth 3 (three) times daily between meals.   fenofibrate 145 MG tablet Commonly known as:  TRICOR Take 1 tablet (145 mg total) by mouth daily.   furosemide 40 MG tablet Commonly known as:  LASIX Take 40 mg by mouth daily.   GENVOYA 150-150-200-10 MG Tabs tablet Generic drug:  elvitegravir-cobicistat-emtricitabine-tenofovir TAKE 1 TABLET BY MOUTH EVERY DAY   HUMULIN R U-500 KWIKPEN 500 UNIT/ML kwikpen Generic drug:  insulin regular human CONCENTRATED ADMINISTER DOSES 30 MINUTES BEFORE EACH MEAL. USE 60 UNITS BEFORE BREAKFAST, 50 UNITS BEFORE LUNCH AND 35 UNITS BEFORE DINNER   HYDROcodone-acetaminophen 10-325 MG tablet Commonly known as:  NORCO Take 1 tablet by mouth every 6 (six) hours as needed for moderate pain or severe pain.   Insulin Pen Needle 32G X 5 MM Misc Commonly known as:  NOVOTWIST Use 2 per day to inject Toujeo and Victoza     methylphenidate 20 MG tablet Commonly known as:  RITALIN Take 20 mg by mouth daily.   multivitamin with minerals tablet Take 1 tablet by mouth daily.   NOVOLOG 100 UNIT/ML injection Generic drug:  insulin aspart ADMINISTER 15 TO 20 UNITS UNDER THE SKIN BEFORE MEALS  pantoprazole 40 MG tablet Commonly known as:  PROTONIX Take 40 mg by mouth daily.   potassium chloride SA 20 MEQ tablet Commonly known as:  K-DUR,KLOR-CON Take 20 mEq by mouth daily.   pregabalin 225 MG capsule Commonly known as:  LYRICA Take 225 mg by mouth 2 (two) times daily.   rosuvastatin 20 MG tablet Commonly known as:  CRESTOR Take 20 mg by mouth daily. Reported on 05/02/2015   TOUJEO SOLOSTAR 300 UNIT/ML Sopn Generic drug:  Insulin Glargine INJECT 70 UNITS UNDER THE SKIN TWICE DAILY   VICTOZA 18 MG/3ML Sopn Generic drug:  Liraglutide INJECT 1.2 MG INTO THE SKIN DAILY   Vitamin D3 5000 units Tabs Take 5,000 Units by mouth daily.   vitamin E 1000 UNIT capsule Generic drug:  vitamin E Take 1,000 Units by mouth daily.       Allergies:  Allergies  Allergen Reactions  . Cortizone-10 [Hydrocortisone] Rash    Per patient, given local injection at knee and developed rash at local site.     Past Medical History:  Diagnosis Date  . Anxiety   . Arthritis    LEFT HIP  . Asthma    HOSPITALIZED WITH EXCERBATION OF ASTHMA - AND BRONCHITIS AND THE FLU DEC 2013  . Depression   . Diabetes mellitus    ON INSULIN AND ORAL MEDICATIONS  . GERD (gastroesophageal reflux disease)   . HIV infection ([place]) 2000  . Hypertension   . Neuropathy    NEUROPATHY HANDS AND FEET  . Pain    SEVERE PAIN LEFT HIP - HX OF LEFT HIP PINNING JULY 2013  . Shortness of breath    ALLERGIES ARE "ACTING UP"    Past Surgical History:  Procedure Laterality Date  . AMPUTATION Left 11/18/2014   Procedure: LEFT BELOW KNEE AMPUTATION;  Surgeon: Leandrew Koyanagi, MD;  Location: [facility];  Service: Orthopedics;  Laterality: Left;  .  CAST APPLICATION Left 07/06/2829   Procedure: CAST APPLICATION (FIBERGLASS);  Surgeon: Latanya Maudlin, MD;  [facility];  Service: Orthopedics;  Laterality: Left;  CLOSED REDUCTION OF LEFT COLLES FRACTURE WITH SHORT ARM CAST  . HARDWARE REMOVAL Left 05/05/2014   Procedure: HARDWARE REMOVAL LEFT HIP;  Surgeon: Latanya Maudlin, MD;  [facility];  Service: Orthopedics;  Laterality: Left;  REMOVAL BIOMET 6.5-8.0 CANNULATED SCREW  . HIP ARTHROPLASTY Left 05/05/2014   Procedure: ARTHROPLASTY OPEN REDUCTION INTERNAL FIXATION LEFT HIP AND REMOVAL OF TWO CANNULATED SCREW;  Surgeon: Latanya Maudlin, MD;  [facility];  Service: Orthopedics;  Laterality: Left;  . HIP PINNING,CANNULATED  07/16/2011   Procedure: CANNULATED HIP PINNING;  Surgeon: Gearlean Alf, MD;  [facility];  Service: Orthopedics;  Laterality: Left;  . HIP PINNING,CANNULATED Left 04/09/2012   Procedure: CANNULATED HIP PINNING AND HARDWARE REVISION;  Surgeon: Gearlean Alf, MD;  [facility];  Service: Orthopedics;  Laterality: Left;  . I&D EXTREMITY Left 11/16/2014   Procedure:  DEBRIDEMENT OF LEFT FOOT POSSIBLE APPLICATION OF INTEGRIA AND VAC ;  Surgeon: Irene Limbo, MD;  Location: [facility];  Service: Plastics;  Laterality: Left;  . PERIPHERAL VASCULAR CATHETERIZATION N/A 11/15/2014   Procedure: Abdominal Aortogram;  Surgeon: Conrad [place], MD;  Location: [place] [facility];  Service: Cardiovascular;  Laterality: N/A;  . PERIPHERAL VASCULAR CATHETERIZATION  11/15/2014   Procedure: Lower Extremity Angiography;  Surgeon: Conrad [place], MD;  Location: [place] [facility];  Service: Cardiovascular;;  . PERIPHERAL VASCULAR CATHETERIZATION Left 11/15/2014   Procedure:  Peripheral Vascular Intervention;  Surgeon: Conrad [place], MD;  Location: [facility];  Service: Cardiovascular;  Laterality: Left;  popliteal artery stenting    Family History  Problem Relation Age of Onset  . Diabetes Mother   . Hypertension Mother   .  Vision loss Mother   . Heart disease Mother   . Hypertension Father   . Thyroid disease Neg Hx     Social History:  reports that she has been smoking E-cigarettes.  She has a 17.50 pack-year smoking history. She has never used smokeless tobacco. She reports that she does not drink alcohol or use drugs.    Review of Systems    Lipid history: Has been treated with  Fenofibrate and Crestor by PCP     Lab Results  Component Value Date   CHOL 157 08/04/2015   HDL 52 08/04/2015   LDLCALC 82 08/04/2015   TRIG 118 08/04/2015   CHOLHDL 3.1 05/20/2015          She takes multiple psychotropic drugs   Physical Examination:  BP 131/78   Pulse 80   Ht 5\' 10"  (1.778 m)   Wt 266 lb (120.7 kg)   LMP 11/16/2010   BMI 38.17 kg/m       ASSESSMENT:  Diabetes type 2, uncontrolled    See history of present illness for detailed discussion of current diabetes management, blood sugar patterns and problems identified  Her A1c is 8.2 and not improving as expected even with her regimen of basal insulin and Humulin at U-500 along with Victoza Not clear when her blood sugars are consistently high but they may be after lunch when she is not watching her diet well  She does not have any blood sugar through review in the last 3 weeks As discussed above difficult to know what her blood sugar patterns are because of her variable meal times and random blood sugar checks However fasting readings are probably fairly good and once low about a month ago Some of her hyperglycemia is related to higher fat foods and snacks especially at lunchtime  She misunderstood and is continuing to take NovoLog 20 units at lunchtime  PLAN:   She will try to check blood sugars either fasting or 2-3 hours after meals She will call if she has hypoglycemia, may possibly reduce [employer] improving diet with avoiding excess high-fat snacks especially at lunch No change in Victoza Consider consultation  with dietitian Will need short-term follow-up with more consistent glucose monitoring for review    Patient Instructions  Check blood sugars on waking up  3x per week  Also check blood sugars about 2 hours after a meal and do this after different meals by rotation  Recommended blood sugar levels on waking up is 90-130 and about 2 hours after meal is 130-160  Please bring your blood sugar monitor to each visit, thank you   Counseling time on subjects discussed above is over 50% of today's 25 minute visit   [name] 10/05/2015, 9:01 PM   Note: This office note was prepared with Estate agent. Any transcriptional errors that result from this process are unintentional.

## 2015-10-05 NOTE — Therapy (Signed)
[facility] [address] [place], [place], [address] Phone: [phone]   Fax:  [phone]  Physical Therapy Treatment  Patient Details  Name: [name] MRN: [id number] Date of Birth: 1965-04-05 Referring Provider: Frankey Shown, MD  Encounter Date: 10/04/2015      PT End of Session - 10/04/15 1630    Visit Number 28   Number of Visits 31   Date for PT Re-Evaluation 10/07/15   Authorization Type Medicare G-Code & progress report every 10 visits   PT Start Time 4010   PT Stop Time 1614   PT Time Calculation (min) 44 min   Equipment Utilized During Treatment Gait belt   Activity Tolerance Patient limited by pain;Patient tolerated treatment well   Behavior During Therapy [facility] for tasks assessed/performed      Past Medical History:  Diagnosis Date  . Anxiety   . Arthritis    LEFT HIP  . Asthma    HOSPITALIZED WITH EXCERBATION OF ASTHMA - AND BRONCHITIS AND THE FLU DEC 2013  . Depression   . Diabetes mellitus    ON INSULIN AND ORAL MEDICATIONS  . GERD (gastroesophageal reflux disease)   . HIV infection ([place]) 2000  . Hypertension   . Neuropathy    NEUROPATHY HANDS AND FEET  . Pain    SEVERE PAIN LEFT HIP - HX OF LEFT HIP PINNING JULY 2013  . Shortness of breath    ALLERGIES ARE "ACTING UP"    Past Surgical History:  Procedure Laterality Date  . AMPUTATION Left 11/18/2014   Procedure: LEFT BELOW KNEE AMPUTATION;  Surgeon: Leandrew Koyanagi, MD;  Location: [facility];  Service: Orthopedics;  Laterality: Left;  . CAST APPLICATION Left 02/07/2534   Procedure: CAST APPLICATION (FIBERGLASS);  Surgeon: Latanya Maudlin, MD;  [facility];  Service: Orthopedics;  Laterality: Left;  CLOSED REDUCTION OF LEFT COLLES FRACTURE WITH SHORT ARM CAST  . HARDWARE REMOVAL Left 05/05/2014   Procedure: HARDWARE REMOVAL LEFT HIP;  Surgeon: Latanya Maudlin, MD;  [facility];  Service: Orthopedics;  Laterality: Left;  REMOVAL BIOMET  6.5-8.0 CANNULATED SCREW  . HIP ARTHROPLASTY Left 05/05/2014   Procedure: ARTHROPLASTY OPEN REDUCTION INTERNAL FIXATION LEFT HIP AND REMOVAL OF TWO CANNULATED SCREW;  Surgeon: Latanya Maudlin, MD;  [facility];  Service: Orthopedics;  Laterality: Left;  . HIP PINNING,CANNULATED  07/16/2011   Procedure: CANNULATED HIP PINNING;  Surgeon: Gearlean Alf, MD;  [facility];  Service: Orthopedics;  Laterality: Left;  . HIP PINNING,CANNULATED Left 04/09/2012   Procedure: CANNULATED HIP PINNING AND HARDWARE REVISION;  Surgeon: Gearlean Alf, MD;  [facility];  Service: Orthopedics;  Laterality: Left;  . I&D EXTREMITY Left 11/16/2014   Procedure:  DEBRIDEMENT OF LEFT FOOT POSSIBLE APPLICATION OF INTEGRIA AND VAC ;  Surgeon: Irene Limbo, MD;  Location: [facility];  Service: Plastics;  Laterality: Left;  . PERIPHERAL VASCULAR CATHETERIZATION N/A 11/15/2014   Procedure: Abdominal Aortogram;  Surgeon: Conrad [place], MD;  Location: [place] [facility];  Service: Cardiovascular;  Laterality: N/A;  . PERIPHERAL VASCULAR CATHETERIZATION  11/15/2014   Procedure: Lower Extremity Angiography;  Surgeon: Conrad [place], MD;  Location: [place] [facility];  Service: Cardiovascular;;  . PERIPHERAL VASCULAR CATHETERIZATION Left 11/15/2014   Procedure: Peripheral Vascular Intervention;  Surgeon: Conrad , MD;  Location: [facility];  Service: Cardiovascular;  Laterality: Left;  popliteal artery stenting    There were no vitals filed for this  visit.      Subjective Assessment - 10/04/15 1532    Subjective She is still sore from fall. No additional falls reported.    Patient is accompained by: Family member   Pertinent History DM2, neuropathy, HIV, mood disorder, HTN, CKD (stage IV), asthma, arthritis, left hip fracture with ORIF 07/16/2011 and hardware removal with new pinning 05/05/2014   Limitations Lifting;Standing;Walking   Patient Stated Goals to use prosthesis to go in community including  shopping, church, [place] meetings   Currently in Pain? Yes   Pain Score 8    Pain Location Elbow   Pain Orientation Right   Pain Descriptors / Indicators Sore;Sharp  sharp with horizontal adduction   Pain Type Acute pain   Pain Onset 1 to 4 weeks ago   Pain Frequency Constant   Aggravating Factors  recent fall   Pain Relieving Factors rest     Prosthetic Training: Patient reports wearing prosthesis all awake hours without issues. She has mild heat rash but verbalizes proper management.  Patient verbalizes proper prosthetic care. PT recommended setting up appointment with prosthetist to assess if ready for socket revision which appears she is. She verbalized understanding. Patient ambulated 310' total between 2 walks with seated rest on rollator walker at 160' safely. Patient negotiated ramp & curb with rollator walker & prosthesis modified independent then ambulated 50' to her wheelchair.   Self-Care: PT performed ice massage while instructing patient in benefits & technique with recommendation to perform 5-7 minutes 2-3x/day while painful. She verbalized understanding.                               PT Short Term Goals - 08/14/15 2305      PT SHORT TERM GOAL #1   Title Patient tolerates wear >90% of awake hours without tenderness or skin issues. (Target Date: 08/05/2015)   Baseline met on 08/04/15   Status Achieved     PT SHORT TERM GOAL #2   Title Berg Balance >/= 26/56 to indicate lower fall risk. (Target Date: 08/05/2015)   Baseline 08/11/15: met with score of 36/56   Status Achieved     PT SHORT TERM GOAL #3   Title Patient ambulates 150' with rollator walker including paved surfaces outside with no balance losses. (Target Date: 08/05/2015)   Baseline 08/04/15: pt able to go 150 feet, was on indoor surfaces only due to heat outside (stayed in due to pt's recent breathing issues with bronchitis/sinus infection)   Status Partially Met     PT SHORT TERM GOAL #4    Title Patient negotiates ramps & curbs with rollator walker & prosthesis with supervision. (Target Date: 08/05/2015)   Baseline 08/04/15- met    Period Weeks   Status Achieved     PT SHORT TERM GOAL #5   Title Patient reports pain increases <2 increments from baseline at start of session with standing & gait activities noted above. (Target Date: 08/05/2015)   Baseline 08/04/15: reported on only 1 increment pain increase with today's session.    Status Achieved           PT Long Term Goals - 10/04/15 1630      PT LONG TERM GOAL #1   Title Patient tolerates wear of prosthesis >90% of awake hours without skin issues or limb tenderness. (NEW Target Date: 10/07/2015)   Baseline MET 10/04/2015   Time 4   Period Weeks   Status Achieved  PT LONG TERM GOAL #2   Title Patient verbalizes & demonstrates understanding of prosthetic care including sock ply management & skin management to enable safe use of prosthesis. (NEW Target Date: 10/07/2015)   Baseline MET 10/04/2015   Time 4   Period Weeks   Status Achieved     PT LONG TERM GOAL #3   Title Berg Balance > 36/56 to indicate lower fall risk.  (NEW Target Date: 10/07/2015)   Time 4   Period Weeks   Status On-going     PT LONG TERM GOAL #4   Title Timed Up & Go <25 seconds with cane or single crutch modified independent safely.  (NEW Target Date: 10/07/2015)   Time 4   Period Weeks   Status On-going     PT LONG TERM GOAL #5   Title Patient ambulates 400' total with one seated rest using rollator walker & prosthesis modified independent to enable safe community mobility. (NEW Target Date: 10/07/2015)   Time 4   Period Weeks   Status On-going     PT LONG TERM GOAL #6   Title Patient negotiates ramps & curbs with rollator walker & prosthesis modified indpendent to enable safe community mobility.  (NEW Target Date: 10/07/2015)   Baseline MET 10/04/2015   Time 4   Period Weeks   Status Achieved               Plan - 10/04/15 1630     Clinical Impression Statement Patient met 3 of 6 LTGs. She is still sore from fall last week especially in right elbow. She saw her Chiropractor who says her elbow is not fractured so does not plan to go to PCP or orthopedist at this time. Pain in right elbow limited gait with rollator walker.    Rehab Potential Good   PT Frequency 2x / week   PT Duration 4 weeks   PT Treatment/Interventions ADLs/Self Care Home Management;DME Instruction;Gait training;Stair training;Functional mobility training;Therapeutic activities;Therapeutic exercise;Balance training;Neuromuscular re-education;Patient/family education;Prosthetic Training;Vestibular   PT Next Visit Plan assess remaining 3 LTGs & discharge   Consulted and Agree with Plan of Care Patient      Patient will benefit from skilled therapeutic intervention in order to improve the following deficits and impairments:  Abnormal gait, Decreased activity tolerance, Decreased balance, Decreased endurance, Decreased knowledge of use of DME, Decreased mobility, Impaired flexibility, Decreased strength, Postural dysfunction, Prosthetic Dependency, Pain  Visit Diagnosis: Other abnormalities of gait and mobility  Unsteadiness on feet  Muscle weakness (generalized)  Other symptoms and signs involving the musculoskeletal system     Problem List Patient Active Problem List   Diagnosis Date Noted  . Diastolic dysfunction 47/09/6281  . Diabetic neuropathy ([place]) 11/10/2014  . Chronic kidney disease (CKD), stage IV (severe) ([place]) 11/10/2014  . Fracture of distal femur ([place]) 07/08/2014  . Osteoporosis 07/08/2014  . Femur fracture, left ([place]) 07/08/2014  . Colles' fracture of left radius   . Left hip pain 05/03/2014  . Hip pain   . Perimenopausal symptoms 02/13/2012  . Hyponatremia 12/29/2011  . Asthma exacerbation attacks 12/28/2011  . Bronchitis 12/28/2011  . Repeated falls 07/30/2011  . Hip fracture, left ([place]) 07/16/2011  . Femoral neck fracture  ([place]) 07/16/2011  . Dyslipidemia 12/23/2008  . DIARRHEA 08/30/2008  . FATIGUE 07/12/2008  . THRUSH 05/20/2008  . Human immunodeficiency virus (HIV) disease ([place]) 04/19/2008  . Diabetes mellitus type 2 with complications, uncontrolled ([place]) 04/19/2008  . Mood disorder ([place]) 04/19/2008  . Hereditary and  idiopathic peripheral neuropathy 04/19/2008  . Essential hypertension 04/19/2008    Jamey Reas PT, DPT 10/05/2015, 6:13 AM  [facility] [address] [place], [place], [address] Phone: [phone]   Fax:  [phone]  Name: [name] MRN: [id number] Date of Birth: 1965/09/15

## 2015-10-05 NOTE — Patient Instructions (Signed)
Check blood sugars on waking up  3x per week  Also check blood sugars about 2 hours after a meal and do this after different meals by rotation  Recommended blood sugar levels on waking up is 90-130 and about 2 hours after meal is 130-160  Please bring your blood sugar monitor to each visit, thank you

## 2015-10-06 ENCOUNTER — Ambulatory Visit: Payer: Commercial Managed Care - HMO | Admitting: Physical Therapy

## 2015-10-17 DIAGNOSIS — Z79891 Long term (current) use of opiate analgesic: Secondary | ICD-10-CM | POA: Diagnosis not present

## 2015-10-17 DIAGNOSIS — M47816 Spondylosis without myelopathy or radiculopathy, lumbar region: Secondary | ICD-10-CM | POA: Diagnosis not present

## 2015-10-17 DIAGNOSIS — G8929 Other chronic pain: Secondary | ICD-10-CM | POA: Diagnosis not present

## 2015-10-17 DIAGNOSIS — Z89512 Acquired absence of left leg below knee: Secondary | ICD-10-CM | POA: Diagnosis not present

## 2015-10-27 DIAGNOSIS — Z5181 Encounter for therapeutic drug level monitoring: Secondary | ICD-10-CM | POA: Diagnosis not present

## 2015-10-27 DIAGNOSIS — Z79899 Other long term (current) drug therapy: Secondary | ICD-10-CM | POA: Diagnosis not present

## 2015-11-17 ENCOUNTER — Ambulatory Visit: Payer: Commercial Managed Care - HMO | Admitting: Endocrinology

## 2015-11-28 ENCOUNTER — Encounter: Payer: Self-pay | Admitting: Physical Therapy

## 2015-11-28 NOTE — Therapy (Signed)
[facility] [address] [place], [place], [address] Phone: [phone]   Fax:  [phone]  Patient Details  Name: [name] MRN: [id number] Date of Birth: 1965/12/31 Referring Provider:  No ref. provider found  Encounter Date: 11/28/2015   PHYSICAL THERAPY DISCHARGE SUMMARY  Visits from Start of Care: 28  Current functional level related to goals / functional outcomes:     PT Long Term Goals - 10/04/15 1630      PT LONG TERM GOAL #1   Title Patient tolerates wear of prosthesis >90% of awake hours without skin issues or limb tenderness. (NEW Target Date: 10/07/2015)   Baseline MET 10/04/2015   Time 4   Period Weeks   Status Achieved     PT LONG TERM GOAL #2   Title Patient verbalizes & demonstrates understanding of prosthetic care including sock ply management & skin management to enable safe use of prosthesis. (NEW Target Date: 10/07/2015)   Baseline MET 10/04/2015   Time 4   Period Weeks   Status Achieved     PT LONG TERM GOAL #3   Title Berg Balance > 36/56 to indicate lower fall risk.  (NEW Target Date: 10/07/2015)   Time 4   Period Weeks   Status On-going     PT LONG TERM GOAL #4   Title Timed Up & Go <25 seconds with cane or single crutch modified independent safely.  (NEW Target Date: 10/07/2015)   Time 4   Period Weeks   Status On-going     PT LONG TERM GOAL #5   Title Patient ambulates 400' total with one seated rest using rollator walker & prosthesis modified independent to enable safe community mobility. (NEW Target Date: 10/07/2015)   Time 4   Period Weeks   Status On-going     PT LONG TERM GOAL #6   Title Patient negotiates ramps & curbs with rollator walker & prosthesis modified indpendent to enable safe community mobility.  (NEW Target Date: 10/07/2015)   Baseline MET 10/04/2015   Time 4   Period Weeks   Status Achieved       Remaining deficits: See above balance & gait deficits.    Education / Equipment: Prosthetic training & HEP  Plan: Patient agrees to discharge.  Patient goals were met. Patient is being discharged due to meeting the stated rehab goals.  ?????        Knight Oelkers PT, DPT 11/28/2015, 1:09 PM  [facility] [address] [place], [place], [address] Phone: [phone]   Fax:  [phone]

## 2015-12-05 ENCOUNTER — Other Ambulatory Visit: Payer: Commercial Managed Care - HMO

## 2015-12-10 ENCOUNTER — Other Ambulatory Visit: Payer: Self-pay | Admitting: Endocrinology

## 2015-12-14 DIAGNOSIS — G8929 Other chronic pain: Secondary | ICD-10-CM | POA: Diagnosis not present

## 2015-12-14 DIAGNOSIS — Z89512 Acquired absence of left leg below knee: Secondary | ICD-10-CM | POA: Diagnosis not present

## 2015-12-14 DIAGNOSIS — Z79891 Long term (current) use of opiate analgesic: Secondary | ICD-10-CM | POA: Diagnosis not present

## 2015-12-14 DIAGNOSIS — M47816 Spondylosis without myelopathy or radiculopathy, lumbar region: Secondary | ICD-10-CM | POA: Diagnosis not present

## 2015-12-18 ENCOUNTER — Other Ambulatory Visit: Payer: Self-pay | Admitting: Infectious Diseases

## 2015-12-19 ENCOUNTER — Ambulatory Visit: Payer: Commercial Managed Care - HMO | Admitting: Infectious Diseases

## 2016-01-04 ENCOUNTER — Ambulatory Visit: Payer: Commercial Managed Care - HMO | Admitting: Endocrinology

## 2016-01-16 ENCOUNTER — Other Ambulatory Visit: Payer: Self-pay

## 2016-01-18 ENCOUNTER — Other Ambulatory Visit: Payer: Self-pay | Admitting: Infectious Diseases

## 2016-01-30 ENCOUNTER — Ambulatory Visit: Payer: Self-pay | Admitting: Infectious Diseases

## 2016-01-30 ENCOUNTER — Other Ambulatory Visit: Payer: Self-pay | Admitting: Endocrinology

## 2016-02-02 ENCOUNTER — Ambulatory Visit: Payer: Commercial Managed Care - HMO | Admitting: Endocrinology

## 2016-02-09 DIAGNOSIS — Z89612 Acquired absence of left leg above knee: Secondary | ICD-10-CM | POA: Diagnosis not present

## 2016-02-23 ENCOUNTER — Telehealth: Payer: Self-pay | Admitting: Endocrinology

## 2016-02-23 NOTE — Telephone Encounter (Signed)
Pt needs her Toujeo and Victoza sent to the [facility] on [address].

## 2016-02-24 MED ORDER — INSULIN GLARGINE 300 UNIT/ML ~~LOC~~ SOPN: 70.0000 [IU] | PEN_INJECTOR | Freq: Two times a day (BID) | SUBCUTANEOUS | 0 refills | Status: DC

## 2016-02-24 MED ORDER — LIRAGLUTIDE 18 MG/3ML ~~LOC~~ SOPN: PEN_INJECTOR | SUBCUTANEOUS | 0 refills | Status: DC

## 2016-02-24 NOTE — Telephone Encounter (Signed)
Refill submitted. 

## 2016-03-05 ENCOUNTER — Other Ambulatory Visit: Payer: Self-pay

## 2016-03-05 ENCOUNTER — Encounter: Payer: Self-pay | Admitting: Endocrinology

## 2016-03-05 ENCOUNTER — Ambulatory Visit (INDEPENDENT_AMBULATORY_CARE_PROVIDER_SITE_OTHER): Payer: Commercial Managed Care - HMO | Admitting: Endocrinology

## 2016-03-05 VITALS — BP 110/88 | HR 75 | Ht 70.0 in | Wt 250.0 lb

## 2016-03-05 DIAGNOSIS — Z89512 Acquired absence of left leg below knee: Secondary | ICD-10-CM

## 2016-03-05 DIAGNOSIS — N184 Chronic kidney disease, stage 4 (severe): Secondary | ICD-10-CM

## 2016-03-05 DIAGNOSIS — Z794 Long term (current) use of insulin: Secondary | ICD-10-CM

## 2016-03-05 DIAGNOSIS — E1165 Type 2 diabetes mellitus with hyperglycemia: Secondary | ICD-10-CM

## 2016-03-05 LAB — MICROALBUMIN / CREATININE URINE RATIO
CREATININE, U: 45 mg/dL
MICROALB UR: 1.2 mg/dL (ref 0.0–1.9)
MICROALB/CREAT RATIO: 2.7 mg/g (ref 0.0–30.0)

## 2016-03-05 LAB — COMPREHENSIVE METABOLIC PANEL
ALBUMIN: 4.1 g/dL (ref 3.5–5.2)
ALT: 12 U/L (ref 0–35)
AST: 12 U/L (ref 0–37)
Alkaline Phosphatase: 65 U/L (ref 39–117)
BUN: 19 mg/dL (ref 6–23)
CHLORIDE: 101 meq/L (ref 96–112)
CO2: 29 mEq/L (ref 19–32)
Calcium: 9.5 mg/dL (ref 8.4–10.5)
Creatinine, Ser: 1.15 mg/dL (ref 0.40–1.20)
GFR: 52.68 mL/min — AB (ref >60.00)
Glucose, Bld: 145 mg/dL — ABNORMAL HIGH (ref 70–99)
POTASSIUM: 4.2 meq/L (ref 3.5–5.1)
SODIUM: 136 meq/L (ref 135–145)
Total Bilirubin: 0.3 mg/dL (ref 0.2–1.2)
Total Protein: 7.1 g/dL (ref 6.0–8.3)

## 2016-03-05 LAB — POCT GLYCOSYLATED HEMOGLOBIN (HGB A1C): HEMOGLOBIN A1C: 10.3

## 2016-03-05 MED ORDER — GLUCOSE BLOOD VI STRP: ORAL_STRIP | 4 refills | Status: DC

## 2016-03-05 NOTE — Progress Notes (Signed)
Patient ID: [name], female   DOB: [birth year], 51 y.o.   MRN: [id number]           Reason for Appointment: Follow-up  for Type 2 Diabetes   History of Present Illness:          Date of diagnosis of type 2 diabetes mellitus: 2000       Background history:     The patient was apparently diagnosed incidentally with her diabetes 16 years ago 16 years ago, was asymptomatic. She took metformin and possibly other oral hypoglycemic drugs but subsequently went on insulin in 2005 She was taking various insulin regimens in the past  She was being treated by an endocrinologist some time ago and he switched her To Toujeo and also tried her on Byetta.  She thinks that she had better blood sugars with Byetta but she did not follow-up  Novolog was switched to U-500 insulin in 3/17   Recent history:   INSULIN regimen is:  Toujeo 70 units bid  Humulin R U-500: 15-20 units am, 30 units before lunch , Novolog 15-20 at lunch   Non-insulin hypoglycemic drugs the patient is taking are: Victoza 1.8mg     Her A1c is much higher at 10.3, previously 8.2  Current management, blood sugar patterns and problems identified:  She has not been seen in follow-up for several months now  Not clear why her A1c is so much higher than usual  She says she has been using her mother's glucose monitor and not clear how often she is monitoring  She claims her blood sugars are near normal in the morning but higher in the afternoon and evening  HUMULIN R: She was supposed to be taking 30 units at waking up and 50 at lunchtime when she is taking less insulin now  She is mostly eating 1 meal a day in the afternoon.  She has relatively good readings fasting with taking Toujeo but she had previously been taking this once a day and is not doing this twice a day  No recent labs available.  She thinks she is taking her Victoza fairly   Appears to have lost weight    Side effects from medications have been:  None  Compliance with the medical regimen:  fair Hypoglycemia:  rarely low at 4-6pm Glucose monitoring:  done 1 times a day         Glucometer: One Touch Verio.       Blood Glucose readings by recall:  Mean values apply above for all meters except median for One Touch  PRE-MEAL Fasting Lunch Dinner Bedtime Overall  Glucose range: 120+ 240 180    Mean/median:     177     Self-care: The diet that the patient has been following is: tries to limit   Typical meal intake: Breakfast is Usually skipped   Lunch is variable at 3- 4 pm, has mixed meal at dinnertime, sometimes skipped Has variable amounts of snacks.  She is avoiding drinks with sugar                Dietic[facility] visit, most recent: At [facility]               Exercise:  none  Weight history:  Wt Readings from Last 3 Encounters:  03/05/16 250 lb (113.4 kg)  10/05/15 266 lb (120.7 kg)  06/13/15 266 lb (120.7 kg)    Glycemic control: 11 in 8/16   Lab Results  Component Value Date  HGBA1C 10.3 03/05/2016   HGBA1C 8.2 (H) 09/30/2015   HGBA1C 7.9 06/14/2015   Lab Results  Component Value Date   MICROALBUR <0.7 03/16/2015   LDLCALC 82 08/04/2015   CREATININE 2.01 (H) 09/30/2015    Other active problems: See review of systems    Allergies as of 03/05/2016      Reactions   Cortizone-10 [hydrocortisone] Rash   Per patient, given local injection at knee and developed rash at local site.       Medication List       Accurate as of 03/05/16  2:44 PM. Always use your most recent med list.          ARIPiprazole 10 MG tablet Commonly known as:  ABILIFY Take 10 mg by mouth daily.   aspirin 325 MG EC tablet Take 1 tablet (325 mg total) by mouth daily.   budesonide-formoterol 80-4.5 MCG/ACT inhaler Commonly known as:  SYMBICORT Inhale 2 puffs into the lungs 2 (two) times daily.   clonazePAM 0.5 MG tablet Commonly known as:  KLONOPIN Take 0.5 tablets (0.25 mg total) by mouth 3 (three) times daily as needed  (anxiety).   cyclobenzaprine 5 MG tablet Commonly known as:  FLEXERIL Take 2 tablets (10 mg total) by mouth 3 (three) times daily as needed for muscle spasms.   diphenoxylate-atropine 2.5-0.025 MG tablet Commonly known as:  LOMOTIL Take 1 tablet by mouth 4 (four) times daily as needed for diarrhea or loose stools.   escitalopram 20 MG tablet Commonly known as:  LEXAPRO Take 20 mg by mouth daily.   eszopiclone 2 MG Tabs tablet Commonly known as:  LUNESTA Take 3 mg by mouth at bedtime as needed for sleep. Take immediately before bedtime   feeding supplement (GLUCERNA SHAKE) Liqd Take 237 mLs by mouth 3 (three) times daily between meals.   fenofibrate 145 MG tablet Commonly known as:  TRICOR Take 1 tablet (145 mg total) by mouth daily.   furosemide 40 MG tablet Commonly known as:  LASIX Take 40 mg by mouth daily.   GENVOYA 150-150-200-10 MG Tabs tablet Generic drug:  elvitegravir-cobicistat-emtricitabine-tenofovir TAKE 1 TABLET BY MOUTH EVERY DAY   GENVOYA 150-150-200-10 MG Tabs tablet Generic drug:  elvitegravir-cobicistat-emtricitabine-tenofovir TAKE 1 TABLET BY MOUTH EVERY DAY   HUMULIN R U-500 KWIKPEN 500 UNIT/ML kwikpen Generic drug:  insulin regular human CONCENTRATED ADMINISTER DOSES 30 MINUTES BEFORE EACH MEAL. USE 60 UNITS BEFORE BREAKFAST, 50 UNITS BEFORE LUNCH AND 35 UNITS BEFORE DINNER   HYDROcodone-acetaminophen 10-325 MG tablet Commonly known as:  NORCO Take 1 tablet by mouth every 6 (six) hours as needed for moderate pain or severe pain.   Insulin Glargine 300 UNIT/ML Sopn Commonly known as:  TOUJEO SOLOSTAR Inject 70 Units into the skin 2 (two) times daily.   Insulin Pen Needle 32G X 5 MM Misc Commonly known as:  NOVOTWIST Use 2 per day to inject Toujeo and Victoza   liraglutide 18 MG/3ML Sopn Commonly known as:  VICTOZA INJECT 1.8 MG INTO THE SKIN DAILY   methylphenidate 20 MG tablet Commonly known as:  RITALIN Take 20 mg by mouth daily.    multivitamin with minerals tablet Take 1 tablet by mouth daily.   NOVOLOG 100 UNIT/ML injection Generic drug:  insulin aspart ADMINISTER 15 TO 20 UNITS UNDER THE SKIN BEFORE MEALS   pantoprazole 40 MG tablet Commonly known as:  PROTONIX Take 40 mg by mouth daily.   potassium chloride SA 20 MEQ tablet Commonly known as:  K-DUR,KLOR-CON Take 20  mEq by mouth daily.   pregabalin 225 MG capsule Commonly known as:  LYRICA Take 225 mg by mouth 2 (two) times daily.   rosuvastatin 20 MG tablet Commonly known as:  CRESTOR Take 20 mg by mouth daily. Reported on 05/02/2015   Vitamin D3 5000 units Tabs Take 5,000 Units by mouth daily.   vitamin E 1000 UNIT capsule Generic drug:  vitamin E Take 1,000 Units by mouth daily.       Allergies:  Allergies  Allergen Reactions  . Cortizone-10 [Hydrocortisone] Rash    Per patient, given local injection at knee and developed rash at local site.     Past Medical History:  Diagnosis Date  . Anxiety   . Arthritis    LEFT HIP  . Asthma    HOSPITALIZED WITH EXCERBATION OF ASTHMA - AND BRONCHITIS AND THE FLU DEC 2013  . Depression   . Diabetes mellitus    ON INSULIN AND ORAL MEDICATIONS  . GERD (gastroesophageal reflux disease)   . HIV infection ([facility]) 2000  . Hypertension   . Neuropathy    NEUROPATHY HANDS AND FEET  . Pain    SEVERE PAIN LEFT HIP - HX OF LEFT HIP PINNING JULY 2013  . Shortness of breath    ALLERGIES ARE "ACTING UP"    Past Surgical History:  Procedure Laterality Date  . AMPUTATION Left 11/18/2014   Procedure: LEFT BELOW KNEE AMPUTATION;  Surgeon: Leandrew Koyanagi, MD;  Location: [facility];  Service: Orthopedics;  Laterality: Left;  . CAST APPLICATION Left 0/0/9381   Procedure: CAST APPLICATION (FIBERGLASS);  Surgeon: Latanya Maudlin, MD;  Location: [facility];  Service: Orthopedics;  Laterality: Left;  CLOSED REDUCTION OF LEFT COLLES FRACTURE WITH SHORT ARM CAST  . HARDWARE REMOVAL Left 05/05/2014   Procedure: HARDWARE REMOVAL  LEFT HIP;  Surgeon: Latanya Maudlin, MD;  Location: [facility];  Service: Orthopedics;  Laterality: Left;  REMOVAL BIOMET 6.5-8.0 CANNULATED SCREW  . HIP ARTHROPLASTY Left 05/05/2014   Procedure: ARTHROPLASTY OPEN REDUCTION INTERNAL FIXATION LEFT HIP AND REMOVAL OF TWO CANNULATED SCREW;  Surgeon: Latanya Maudlin, MD;  Location: [facility];  Service: Orthopedics;  Laterality: Left;  . HIP PINNING,CANNULATED  07/16/2011   Procedure: CANNULATED HIP PINNING;  Surgeon: Gearlean Alf, MD;  Location: [facility];  Service: Orthopedics;  Laterality: Left;  . HIP PINNING,CANNULATED Left 04/09/2012   Procedure: CANNULATED HIP PINNING AND HARDWARE REVISION;  Surgeon: Gearlean Alf, MD;  Location: [facility];  Service: Orthopedics;  Laterality: Left;  . I&D EXTREMITY Left 11/16/2014   Procedure:  DEBRIDEMENT OF LEFT FOOT POSSIBLE APPLICATION OF INTEGRIA AND VAC ;  Surgeon: Irene Limbo, MD;  Location: [facility];  Service: Plastics;  Laterality: Left;  . PERIPHERAL VASCULAR CATHETERIZATION N/A 11/15/2014   Procedure: Abdominal Aortogram;  Surgeon: Conrad [place], MD;  Location: [facility];  Service: Cardiovascular;  Laterality: N/A;  . PERIPHERAL VASCULAR CATHETERIZATION  11/15/2014   Procedure: Lower Extremity Angiography;  Surgeon: Conrad [place], MD;  Location: [facility];  Service: Cardiovascular;;  . PERIPHERAL VASCULAR CATHETERIZATION Left 11/15/2014   Procedure: Peripheral Vascular Intervention;  Surgeon: Conrad , MD;  Location: [facility];  Service: Cardiovascular;  Laterality: Left;  popliteal artery stenting    Family History  Problem Relation Age of Onset  . Diabetes Mother   . Hypertension Mother   . Vision loss Mother   . Heart disease Mother   . Hypertension Father   . Thyroid disease  Neg Hx     Social History:  reports that she has been smoking E-cigarettes.  She has a 17.50 pack-year smoking history. She has never used smokeless tobacco. She reports that she does not drink alcohol  or use drugs.    Review of Systems    Lipid history: Has been treated with  Fenofibrate and Crestor by PCP     Lab Results  Component Value Date   CHOL 157 08/04/2015   HDL 52 08/04/2015   LDLCALC 82 08/04/2015   TRIG 118 08/04/2015   CHOLHDL 3.1 05/20/2015          She takes multiple psychotropic drugs   Physical Examination:  BP 110/88   Pulse 75   Ht 5\' 10"  (1.778 m)   Wt 250 lb (113.4 kg)   LMP 11/16/2010   SpO2 95%   BMI 35.87 kg/m       ASSESSMENT:  Diabetes type 2, uncontrolled    See history of present illness for detailed discussion of current diabetes management, blood sugar patterns and problems identified  Her A1c is 10.3 now and unable to get a proper evaluation of her blood sugar patterns at home since she is not monitoring much and not using her old meter She has also taking less insulin especially the Humulin R U-500 at all times compared to her last visit Not clear why she is taking Toujeo twice a day even though she was supposed to do it once a day only However fasting readings are reportedly fairly good even with large doses of mealtime insulin Overall taking over 200 units of insulin a day  Since she was complaining about the number of injections she was shown the V-go pump and discussed how this would work and benefit her.  Using the U-500 insulin as the only insulin She is however reluctant to start this right away  PLAN:   She will try to check blood sugars regularly as discussed Given her One Touch Verio monitor Given brochure on the V-go pump and she will call if she is interested She will need to increase her Humulin R up to 30 units in the morning despite not eating breakfast and take at least 50 units at lunchtime If she is eating a meal in the evening she will need to take another dose May continue taking Novolog at her main meal to control postprandial hyperglycemia Consider consultation with dietitian Will need short-term follow-up  in 6 weeks with more consistent glucose monitoring for review She is currently not a candidate for the freestyle Libre sensor since she is not checking or times a day Needs follow-up labs for her chronic kidney disease Encouraged her to follow-up with her infectious disease specialist also   Patient Instructions  30 in am and 50 of the R at lunch  Counseling time on subjects discussed above is over 50% of today's 25 minute visit   Adamari Frede 03/05/2016, 2:44 PM   Note: This office note was prepared with Dragon voice recognition system technology. Any transcriptional errors that result from this process are unintentional.

## 2016-03-05 NOTE — Patient Instructions (Addendum)
30 in am and 50 of the R at lunch

## 2016-03-06 NOTE — Progress Notes (Signed)
Please let patient know that the kidney test is back to normal and she can start back on metformin ER 750 mg, 2 tablets daily, start with 1 tablet daily for the first week with food

## 2016-03-07 ENCOUNTER — Other Ambulatory Visit: Payer: Self-pay

## 2016-03-07 MED ORDER — METFORMIN HCL ER 750 MG PO TB24: 750.0000 mg | ORAL_TABLET | Freq: Every day | ORAL | 3 refills | Status: DC

## 2016-03-12 ENCOUNTER — Other Ambulatory Visit: Payer: Self-pay | Admitting: Infectious Diseases

## 2016-03-12 DIAGNOSIS — B20 Human immunodeficiency virus [HIV] disease: Secondary | ICD-10-CM

## 2016-03-14 DIAGNOSIS — M47816 Spondylosis without myelopathy or radiculopathy, lumbar region: Secondary | ICD-10-CM | POA: Diagnosis not present

## 2016-03-14 DIAGNOSIS — G8929 Other chronic pain: Secondary | ICD-10-CM | POA: Diagnosis not present

## 2016-03-15 ENCOUNTER — Other Ambulatory Visit: Payer: Self-pay | Admitting: Endocrinology

## 2016-03-30 ENCOUNTER — Other Ambulatory Visit: Payer: Self-pay | Admitting: Endocrinology

## 2016-04-04 ENCOUNTER — Encounter (HOSPITAL_COMMUNITY): Payer: Self-pay

## 2016-04-04 ENCOUNTER — Emergency Department (HOSPITAL_COMMUNITY): Payer: Medicare HMO

## 2016-04-04 ENCOUNTER — Inpatient Hospital Stay (HOSPITAL_COMMUNITY)
Admission: EM | Admit: 2016-04-04 | Discharge: 2016-04-09 | DRG: 871 | Disposition: A | Discharge status: Home / Self Care | Payer: Medicare HMO | Attending: Internal Medicine | Admitting: Internal Medicine

## 2016-04-04 DIAGNOSIS — J101 Influenza due to other identified influenza virus with other respiratory manifestations: Secondary | ICD-10-CM | POA: Diagnosis not present

## 2016-04-04 DIAGNOSIS — Z21 Asymptomatic human immunodeficiency virus [HIV] infection status: Secondary | ICD-10-CM | POA: Diagnosis not present

## 2016-04-04 DIAGNOSIS — K529 Noninfective gastroenteritis and colitis, unspecified: Secondary | ICD-10-CM | POA: Diagnosis present

## 2016-04-04 DIAGNOSIS — K219 Gastro-esophageal reflux disease without esophagitis: Secondary | ICD-10-CM | POA: Diagnosis present

## 2016-04-04 DIAGNOSIS — E1310 Other specified diabetes mellitus with ketoacidosis without coma: Secondary | ICD-10-CM | POA: Diagnosis not present

## 2016-04-04 DIAGNOSIS — B20 Human immunodeficiency virus [HIV] disease: Secondary | ICD-10-CM | POA: Diagnosis not present

## 2016-04-04 DIAGNOSIS — E119 Type 2 diabetes mellitus without complications: Secondary | ICD-10-CM | POA: Diagnosis not present

## 2016-04-04 DIAGNOSIS — E1142 Type 2 diabetes mellitus with diabetic polyneuropathy: Secondary | ICD-10-CM | POA: Diagnosis not present

## 2016-04-04 DIAGNOSIS — Z7951 Long term (current) use of inhaled steroids: Secondary | ICD-10-CM

## 2016-04-04 DIAGNOSIS — G8929 Other chronic pain: Secondary | ICD-10-CM | POA: Diagnosis present

## 2016-04-04 DIAGNOSIS — R739 Hyperglycemia, unspecified: Secondary | ICD-10-CM

## 2016-04-04 DIAGNOSIS — Z89512 Acquired absence of left leg below knee: Secondary | ICD-10-CM

## 2016-04-04 DIAGNOSIS — E1110 Type 2 diabetes mellitus with ketoacidosis without coma: Secondary | ICD-10-CM

## 2016-04-04 DIAGNOSIS — Z6841 Body Mass Index (BMI) 40.0 and over, adult: Secondary | ICD-10-CM

## 2016-04-04 DIAGNOSIS — J1000 Influenza due to other identified influenza virus with unspecified type of pneumonia: Secondary | ICD-10-CM | POA: Diagnosis not present

## 2016-04-04 DIAGNOSIS — Z888 Allergy status to other drugs, medicaments and biological substances status: Secondary | ICD-10-CM

## 2016-04-04 DIAGNOSIS — M1990 Unspecified osteoarthritis, unspecified site: Secondary | ICD-10-CM | POA: Diagnosis present

## 2016-04-04 DIAGNOSIS — J440 Chronic obstructive pulmonary disease with acute lower respiratory infection: Secondary | ICD-10-CM | POA: Diagnosis not present

## 2016-04-04 DIAGNOSIS — IMO0002 Reserved for concepts with insufficient information to code with codable children: Secondary | ICD-10-CM

## 2016-04-04 DIAGNOSIS — R52 Pain, unspecified: Secondary | ICD-10-CM | POA: Diagnosis not present

## 2016-04-04 DIAGNOSIS — E1122 Type 2 diabetes mellitus with diabetic chronic kidney disease: Secondary | ICD-10-CM | POA: Diagnosis present

## 2016-04-04 DIAGNOSIS — I10 Essential (primary) hypertension: Secondary | ICD-10-CM | POA: Diagnosis not present

## 2016-04-04 DIAGNOSIS — J189 Pneumonia, unspecified organism: Secondary | ICD-10-CM | POA: Diagnosis not present

## 2016-04-04 DIAGNOSIS — E11649 Type 2 diabetes mellitus with hypoglycemia without coma: Secondary | ICD-10-CM | POA: Diagnosis present

## 2016-04-04 DIAGNOSIS — N184 Chronic kidney disease, stage 4 (severe): Secondary | ICD-10-CM | POA: Diagnosis not present

## 2016-04-04 DIAGNOSIS — R531 Weakness: Secondary | ICD-10-CM | POA: Diagnosis not present

## 2016-04-04 DIAGNOSIS — R05 Cough: Secondary | ICD-10-CM | POA: Diagnosis not present

## 2016-04-04 DIAGNOSIS — F17210 Nicotine dependence, cigarettes, uncomplicated: Secondary | ICD-10-CM | POA: Diagnosis not present

## 2016-04-04 DIAGNOSIS — E1165 Type 2 diabetes mellitus with hyperglycemia: Secondary | ICD-10-CM | POA: Diagnosis not present

## 2016-04-04 DIAGNOSIS — Z833 Family history of diabetes mellitus: Secondary | ICD-10-CM | POA: Diagnosis not present

## 2016-04-04 DIAGNOSIS — I129 Hypertensive chronic kidney disease with stage 1 through stage 4 chronic kidney disease, or unspecified chronic kidney disease: Secondary | ICD-10-CM | POA: Diagnosis not present

## 2016-04-04 DIAGNOSIS — Z8349 Family history of other endocrine, nutritional and metabolic diseases: Secondary | ICD-10-CM | POA: Diagnosis not present

## 2016-04-04 DIAGNOSIS — Z79891 Long term (current) use of opiate analgesic: Secondary | ICD-10-CM

## 2016-04-04 DIAGNOSIS — F419 Anxiety disorder, unspecified: Secondary | ICD-10-CM | POA: Diagnosis present

## 2016-04-04 DIAGNOSIS — J988 Other specified respiratory disorders: Secondary | ICD-10-CM | POA: Diagnosis not present

## 2016-04-04 DIAGNOSIS — R0602 Shortness of breath: Secondary | ICD-10-CM

## 2016-04-04 DIAGNOSIS — Z8249 Family history of ischemic heart disease and other diseases of the circulatory system: Secondary | ICD-10-CM

## 2016-04-04 DIAGNOSIS — I152 Hypertension secondary to endocrine disorders: Secondary | ICD-10-CM | POA: Diagnosis present

## 2016-04-04 DIAGNOSIS — IMO0001 Reserved for inherently not codable concepts without codable children: Secondary | ICD-10-CM

## 2016-04-04 DIAGNOSIS — A419 Sepsis, unspecified organism: Secondary | ICD-10-CM

## 2016-04-04 DIAGNOSIS — E118 Type 2 diabetes mellitus with unspecified complications: Secondary | ICD-10-CM

## 2016-04-04 DIAGNOSIS — G4733 Obstructive sleep apnea (adult) (pediatric): Secondary | ICD-10-CM | POA: Diagnosis not present

## 2016-04-04 DIAGNOSIS — Z79899 Other long term (current) drug therapy: Secondary | ICD-10-CM | POA: Diagnosis not present

## 2016-04-04 DIAGNOSIS — E1140 Type 2 diabetes mellitus with diabetic neuropathy, unspecified: Secondary | ICD-10-CM | POA: Diagnosis present

## 2016-04-04 DIAGNOSIS — E6609 Other obesity due to excess calories: Secondary | ICD-10-CM | POA: Diagnosis not present

## 2016-04-04 DIAGNOSIS — F329 Major depressive disorder, single episode, unspecified: Secondary | ICD-10-CM | POA: Diagnosis present

## 2016-04-04 DIAGNOSIS — Z7982 Long term (current) use of aspirin: Secondary | ICD-10-CM | POA: Diagnosis not present

## 2016-04-04 DIAGNOSIS — R404 Transient alteration of awareness: Secondary | ICD-10-CM | POA: Diagnosis not present

## 2016-04-04 DIAGNOSIS — Z794 Long term (current) use of insulin: Secondary | ICD-10-CM | POA: Diagnosis not present

## 2016-04-04 DIAGNOSIS — A4189 Other specified sepsis: Secondary | ICD-10-CM | POA: Diagnosis not present

## 2016-04-04 DIAGNOSIS — E669 Obesity, unspecified: Secondary | ICD-10-CM | POA: Diagnosis not present

## 2016-04-04 DIAGNOSIS — Z96642 Presence of left artificial hip joint: Secondary | ICD-10-CM | POA: Diagnosis present

## 2016-04-04 DIAGNOSIS — E662 Morbid (severe) obesity with alveolar hypoventilation: Secondary | ICD-10-CM | POA: Diagnosis not present

## 2016-04-04 DIAGNOSIS — E1159 Type 2 diabetes mellitus with other circulatory complications: Secondary | ICD-10-CM | POA: Diagnosis present

## 2016-04-04 DIAGNOSIS — E785 Hyperlipidemia, unspecified: Secondary | ICD-10-CM | POA: Diagnosis present

## 2016-04-04 HISTORY — DX: Sepsis, unspecified organism: A41.9

## 2016-04-04 HISTORY — DX: Type 2 diabetes mellitus with ketoacidosis without coma: E11.10

## 2016-04-04 LAB — COMPREHENSIVE METABOLIC PANEL
ALT: 29 U/L (ref 14–54)
AST: 27 U/L (ref 15–41)
Albumin: 3.3 g/dL — ABNORMAL LOW (ref 3.5–5.0)
Alkaline Phosphatase: 76 U/L (ref 38–126)
Anion gap: 16 — ABNORMAL HIGH (ref 5–15)
BILIRUBIN TOTAL: 1.1 mg/dL (ref 0.3–1.2)
BUN: 25 mg/dL — AB (ref 6–20)
CALCIUM: 9.5 mg/dL (ref 8.9–10.3)
CHLORIDE: 96 mmol/L — AB (ref 101–111)
CO2: 19 mmol/L — ABNORMAL LOW (ref 22–32)
CREATININE: 1.44 mg/dL — AB (ref 0.44–1.00)
GFR, EST AFRICAN AMERICAN: 47 mL/min — AB (ref >60)
GFR, EST NON AFRICAN AMERICAN: 41 mL/min — AB (ref >60)
Glucose, Bld: 423 mg/dL — ABNORMAL HIGH (ref 65–99)
Potassium: 4.6 mmol/L (ref 3.5–5.1)
Sodium: 131 mmol/L — ABNORMAL LOW (ref 135–145)
TOTAL PROTEIN: 8.2 g/dL — AB (ref 6.5–8.1)

## 2016-04-04 LAB — I-STAT CHEM 8, ED
BUN: 34 mg/dL — ABNORMAL HIGH (ref 6–20)
CHLORIDE: 98 mmol/L — AB (ref 101–111)
Calcium, Ion: 1.18 mmol/L (ref 1.15–1.40)
Creatinine, Ser: 1.1 mg/dL — ABNORMAL HIGH (ref 0.44–1.00)
GLUCOSE: 423 mg/dL — AB (ref 65–99)
HCT: 47 % — ABNORMAL HIGH (ref 36.0–46.0)
HEMOGLOBIN: 16 g/dL — AB (ref 12.0–15.0)
POTASSIUM: 5 mmol/L (ref 3.5–5.1)
SODIUM: 131 mmol/L — AB (ref 135–145)
TCO2: 22 mmol/L (ref 0–100)

## 2016-04-04 LAB — CBC
HEMATOCRIT: 35.6 % — AB (ref 36.0–46.0)
Hemoglobin: 12.2 g/dL (ref 12.0–15.0)
MCH: 30.9 pg (ref 26.0–34.0)
MCHC: 34.3 g/dL (ref 30.0–36.0)
MCV: 90.1 fL (ref 78.0–100.0)
Platelets: 205 10*3/uL (ref 150–400)
RBC: 3.95 MIL/uL (ref 3.87–5.11)
RDW: 15 % (ref 11.5–15.5)
WBC: 10.3 10*3/uL (ref 4.0–10.5)

## 2016-04-04 LAB — URINALYSIS, ROUTINE W REFLEX MICROSCOPIC
BILIRUBIN URINE: NEGATIVE
Bacteria, UA: NONE SEEN
Ketones, ur: 80 mg/dL — AB
LEUKOCYTES UA: NEGATIVE
Nitrite: NEGATIVE
PH: 5 (ref 5.0–8.0)
Protein, ur: 100 mg/dL — AB
SPECIFIC GRAVITY, URINE: 1.027 (ref 1.005–1.030)

## 2016-04-04 LAB — CBC WITH DIFFERENTIAL/PLATELET
BASOS ABS: 0.1 10*3/uL (ref 0.0–0.1)
Basophils Relative: 1 %
EOS ABS: 0 10*3/uL (ref 0.0–0.7)
EOS PCT: 0 %
HCT: 42.3 % (ref 36.0–46.0)
Hemoglobin: 14.5 g/dL (ref 12.0–15.0)
LYMPHS ABS: 0.7 10*3/uL (ref 0.7–4.0)
Lymphocytes Relative: 7 %
MCH: 30.8 pg (ref 26.0–34.0)
MCHC: 34.3 g/dL (ref 30.0–36.0)
MCV: 89.8 fL (ref 78.0–100.0)
MONO ABS: 0.9 10*3/uL (ref 0.1–1.0)
Monocytes Relative: 9 %
NEUTROS PCT: 83 %
Neutro Abs: 8.4 10*3/uL — ABNORMAL HIGH (ref 1.7–7.7)
PLATELETS: 233 10*3/uL (ref 150–400)
RBC: 4.71 MIL/uL (ref 3.87–5.11)
RDW: 15.1 % (ref 11.5–15.5)
WBC: 10.1 10*3/uL (ref 4.0–10.5)

## 2016-04-04 LAB — GLUCOSE, CAPILLARY
GLUCOSE-CAPILLARY: 187 mg/dL — AB (ref 65–99)
GLUCOSE-CAPILLARY: 164 mg/dL — AB (ref 65–99)
Glucose-Capillary: 191 mg/dL — ABNORMAL HIGH (ref 65–99)

## 2016-04-04 LAB — CBG MONITORING, ED
GLUCOSE-CAPILLARY: 373 mg/dL — AB (ref 65–99)
GLUCOSE-CAPILLARY: 358 mg/dL — AB (ref 65–99)
GLUCOSE-CAPILLARY: 279 mg/dL — AB (ref 65–99)
GLUCOSE-CAPILLARY: 239 mg/dL — AB (ref 65–99)
GLUCOSE-CAPILLARY: 195 mg/dL — AB (ref 65–99)

## 2016-04-04 LAB — I-STAT CG4 LACTIC ACID, ED
LACTIC ACID, VENOUS: 1.17 mmol/L (ref 0.5–1.9)
Lactic Acid, Venous: 2.08 mmol/L (ref 0.5–1.9)

## 2016-04-04 LAB — CREATININE, SERUM
CREATININE: 0.95 mg/dL (ref 0.44–1.00)
GFR calc Af Amer: >60 mL/min (ref >60)

## 2016-04-04 LAB — INFLUENZA PANEL BY PCR (TYPE A & B)
INFLBPCR: POSITIVE — AB
Influenza A By PCR: NEGATIVE

## 2016-04-04 MED ORDER — DEXTROSE 5 % IV SOLN
500.0000 mg | Freq: Once | INTRAVENOUS | Status: AC
  Administered 2016-04-04: 500 mg via INTRAVENOUS
  Filled 2016-04-04: qty 500

## 2016-04-04 MED ORDER — TIZANIDINE HCL 4 MG PO TABS
4.0000 mg | ORAL_TABLET | Freq: Three times a day (TID) | ORAL | Status: DC | PRN
  Administered 2016-04-06 – 2016-04-09 (×5): 4 mg via ORAL
  Filled 2016-04-04 (×5): qty 1

## 2016-04-04 MED ORDER — HYDROCODONE-ACETAMINOPHEN 5-325 MG PO TABS
1.0000 | ORAL_TABLET | Freq: Once | ORAL | Status: AC
  Administered 2016-04-04: 1 via ORAL
  Filled 2016-04-04: qty 1

## 2016-04-04 MED ORDER — DEXTROSE 5 % IV SOLN
1.0000 g | INTRAVENOUS | Status: DC
  Administered 2016-04-05 – 2016-04-08 (×4): 1 g via INTRAVENOUS
  Filled 2016-04-04 (×5): qty 10

## 2016-04-04 MED ORDER — HYDROCODONE-ACETAMINOPHEN 10-325 MG PO TABS
1.0000 | ORAL_TABLET | Freq: Four times a day (QID) | ORAL | Status: DC | PRN
  Administered 2016-04-04 – 2016-04-09 (×14): 1 via ORAL
  Filled 2016-04-04 (×15): qty 1

## 2016-04-04 MED ORDER — DEXTROSE 50 % IV SOLN: 25.0000 mL | INTRAVENOUS | Status: DC | PRN

## 2016-04-04 MED ORDER — SODIUM CHLORIDE 0.9 % IV SOLN: INTRAVENOUS | Status: DC

## 2016-04-04 MED ORDER — SODIUM CHLORIDE 0.9 % IV BOLUS (SEPSIS)
1000.0000 mL | Freq: Once | INTRAVENOUS | Status: AC
  Administered 2016-04-04: 1000 mL via INTRAVENOUS

## 2016-04-04 MED ORDER — SODIUM CHLORIDE 0.9 % IV SOLN
INTRAVENOUS | Status: DC
  Administered 2016-04-04: 22:00:00 via INTRAVENOUS

## 2016-04-04 MED ORDER — ASPIRIN EC 325 MG PO TBEC
325.0000 mg | DELAYED_RELEASE_TABLET | Freq: Every day | ORAL | Status: DC
  Administered 2016-04-04 – 2016-04-09 (×6): 325 mg via ORAL
  Filled 2016-04-04 (×6): qty 1

## 2016-04-04 MED ORDER — DEXTROSE 5 % IV SOLN
1.0000 g | Freq: Once | INTRAVENOUS | Status: AC
  Administered 2016-04-04: 1 g via INTRAVENOUS
  Filled 2016-04-04: qty 10

## 2016-04-04 MED ORDER — DEXTROSE-NACL 5-0.45 % IV SOLN
INTRAVENOUS | Status: DC
  Administered 2016-04-04: 19:00:00 via INTRAVENOUS

## 2016-04-04 MED ORDER — INSULIN REGULAR BOLUS VIA INFUSION
0.0000 [IU] | Freq: Three times a day (TID) | INTRAVENOUS | Status: DC
  Filled 2016-04-04: qty 10

## 2016-04-04 MED ORDER — IPRATROPIUM-ALBUTEROL 0.5-2.5 (3) MG/3ML IN SOLN
3.0000 mL | Freq: Once | RESPIRATORY_TRACT | Status: AC
  Administered 2016-04-04: 3 mL via RESPIRATORY_TRACT
  Filled 2016-04-04: qty 3

## 2016-04-04 MED ORDER — INSULIN REGULAR HUMAN 100 UNIT/ML IJ SOLN
INTRAMUSCULAR | Status: DC
  Administered 2016-04-04: 3.1 [IU]/h via INTRAVENOUS
  Filled 2016-04-04: qty 2.5

## 2016-04-04 MED ORDER — DIPHENOXYLATE-ATROPINE 2.5-0.025 MG PO TABS
1.0000 | ORAL_TABLET | Freq: Four times a day (QID) | ORAL | Status: DC | PRN
Start: 2016-04-04 — End: 2016-04-09
  Administered 2016-04-06 – 2016-04-07 (×2): 1 via ORAL
  Filled 2016-04-04 (×2): qty 1

## 2016-04-04 MED ORDER — ZOLPIDEM TARTRATE 5 MG PO TABS
5.0000 mg | ORAL_TABLET | Freq: Every evening | ORAL | Status: DC | PRN
  Administered 2016-04-04 – 2016-04-08 (×3): 5 mg via ORAL
  Filled 2016-04-04 (×3): qty 1

## 2016-04-04 MED ORDER — PANTOPRAZOLE SODIUM 40 MG PO TBEC
40.0000 mg | DELAYED_RELEASE_TABLET | Freq: Every day | ORAL | Status: DC
  Administered 2016-04-04 – 2016-04-09 (×6): 40 mg via ORAL
  Filled 2016-04-04 (×6): qty 1

## 2016-04-04 MED ORDER — MORPHINE SULFATE (PF) 2 MG/ML IV SOLN
4.0000 mg | Freq: Once | INTRAVENOUS | Status: AC
  Administered 2016-04-04: 4 mg via INTRAVENOUS
  Filled 2016-04-04: qty 2

## 2016-04-04 MED ORDER — BUDESONIDE 0.25 MG/2ML IN SUSP
0.2500 mg | Freq: Two times a day (BID) | RESPIRATORY_TRACT | Status: DC
  Administered 2016-04-04 – 2016-04-05 (×3): 0.25 mg via RESPIRATORY_TRACT
  Filled 2016-04-04 (×4): qty 2

## 2016-04-04 MED ORDER — SODIUM CHLORIDE 0.9 % IV BOLUS (SEPSIS)
500.0000 mL | Freq: Once | INTRAVENOUS | Status: AC
  Administered 2016-04-04: 500 mL via INTRAVENOUS

## 2016-04-04 MED ORDER — CLONAZEPAM 0.5 MG PO TABS
0.2500 mg | ORAL_TABLET | Freq: Three times a day (TID) | ORAL | Status: DC | PRN
  Administered 2016-04-04 – 2016-04-09 (×11): 0.25 mg via ORAL
  Filled 2016-04-04 (×13): qty 1

## 2016-04-04 MED ORDER — ATENOLOL 25 MG PO TABS
25.0000 mg | ORAL_TABLET | Freq: Every day | ORAL | Status: DC
  Administered 2016-04-05 – 2016-04-09 (×5): 25 mg via ORAL
  Filled 2016-04-04 (×5): qty 1

## 2016-04-04 MED ORDER — VITAMIN D3 25 MCG (1000 UNIT) PO TABS
5000.0000 [IU] | ORAL_TABLET | Freq: Every day | ORAL | Status: DC
  Administered 2016-04-04 – 2016-04-09 (×6): 5000 [IU] via ORAL
  Filled 2016-04-04 (×6): qty 5

## 2016-04-04 MED ORDER — HEPARIN SODIUM (PORCINE) 5000 UNIT/ML IJ SOLN
5000.0000 [IU] | Freq: Three times a day (TID) | INTRAMUSCULAR | Status: DC
  Administered 2016-04-04 – 2016-04-09 (×15): 5000 [IU] via SUBCUTANEOUS
  Filled 2016-04-04 (×14): qty 1

## 2016-04-04 MED ORDER — ROSUVASTATIN CALCIUM 20 MG PO TABS
20.0000 mg | ORAL_TABLET | Freq: Every day | ORAL | Status: DC
  Administered 2016-04-04 – 2016-04-09 (×6): 20 mg via ORAL
  Filled 2016-04-04 (×6): qty 1

## 2016-04-04 MED ORDER — SODIUM CHLORIDE 0.9 % IV SOLN
INTRAVENOUS | Status: DC
  Administered 2016-04-05 – 2016-04-06 (×3): via INTRAVENOUS

## 2016-04-04 MED ORDER — DEXTROSE-NACL 5-0.45 % IV SOLN
INTRAVENOUS | Status: DC
  Administered 2016-04-04: 21:00:00 via INTRAVENOUS

## 2016-04-04 MED ORDER — VITAMIN E 45 MG (100 UNIT) PO CAPS
1000.0000 [IU] | ORAL_CAPSULE | Freq: Every day | ORAL | Status: DC
  Administered 2016-04-04 – 2016-04-09 (×6): 1000 [IU] via ORAL
  Filled 2016-04-04 (×6): qty 2

## 2016-04-04 MED ORDER — IPRATROPIUM-ALBUTEROL 0.5-2.5 (3) MG/3ML IN SOLN
3.0000 mL | Freq: Four times a day (QID) | RESPIRATORY_TRACT | Status: DC | PRN
  Administered 2016-04-05: 3 mL via RESPIRATORY_TRACT
  Filled 2016-04-04 (×2): qty 3

## 2016-04-04 MED ORDER — OSELTAMIVIR PHOSPHATE 75 MG PO CAPS
75.0000 mg | ORAL_CAPSULE | Freq: Two times a day (BID) | ORAL | Status: AC
  Administered 2016-04-04 – 2016-04-09 (×10): 75 mg via ORAL
  Filled 2016-04-04 (×10): qty 1

## 2016-04-04 MED ORDER — ARIPIPRAZOLE 10 MG PO TABS
10.0000 mg | ORAL_TABLET | Freq: Every day | ORAL | Status: DC
  Administered 2016-04-04 – 2016-04-09 (×6): 10 mg via ORAL
  Filled 2016-04-04: qty 1
  Filled 2016-04-04: qty 2
  Filled 2016-04-04 (×4): qty 1
  Filled 2016-04-04 (×2): qty 2
  Filled 2016-04-04: qty 1

## 2016-04-04 MED ORDER — DEXTROSE 5 % IV SOLN
500.0000 mg | INTRAVENOUS | Status: DC
  Administered 2016-04-05 – 2016-04-08 (×4): 500 mg via INTRAVENOUS
  Filled 2016-04-04 (×5): qty 500

## 2016-04-04 MED ORDER — ELVITEG-COBIC-EMTRICIT-TENOFAF 150-150-200-10 MG PO TABS
1.0000 | ORAL_TABLET | Freq: Every day | ORAL | Status: DC
  Administered 2016-04-04: 1 via ORAL
  Filled 2016-04-04 (×2): qty 1

## 2016-04-04 MED ORDER — ONDANSETRON HCL 4 MG/2ML IJ SOLN: 4.0000 mg | Freq: Four times a day (QID) | INTRAMUSCULAR | Status: DC | PRN

## 2016-04-04 NOTE — ED Notes (Signed)
Note: I am only able to obtain one IV and one blood culture. IV antibiotics not delayed.

## 2016-04-04 NOTE — ED Notes (Signed)
2 failed attmpts to collect labs

## 2016-04-04 NOTE — ED Notes (Signed)
Failed attempt to collect Blood CUltures

## 2016-04-04 NOTE — ED Notes (Signed)
Not able to get urine sample bc its contaminated with bowel

## 2016-04-04 NOTE — ED Notes (Signed)
First b.c. Drawn at 1350 hours.

## 2016-04-04 NOTE — ED Notes (Signed)
Report given to [place], [place] 2W

## 2016-04-04 NOTE — ED Notes (Signed)
Bed: KT62 Expected date:  Expected time:  Means of arrival:  Comments: EMS-hyperglycemic

## 2016-04-04 NOTE — ED Notes (Signed)
2nd b.c. Is being attempted as I write this.

## 2016-04-04 NOTE — H&P (Signed)
History and Physical    [name]:[id number] DOB: 1965/02/17 DOA: 04/04/2016  PCP: Michel Harrow, PA-C   I have briefly reviewed patients previous medical reports in [facility].  Patient coming from: Home  Chief Complaint: SOB, cough, myalgias, fever  HPI: [name] is a 51 y/o woman with PMH significant for HIV, HTN, HLD, uncontrolled Type 2 diabetes, obesity, COPD and tobacco abuse; who presented to ED complaining of fever/chills, generalized body aches, SOB and productive cough. Patient symptoms has been present for the last 2 days or so and worsening. Patient denies hemoptysis, abd pain, dysuria, hematuria, melena, hematochezia, hematemesis, focal weakness or any other complaints. Reports not been compliant with her insulin therapy in the last 2 days or so due to not feeling good and not eating well, with concerns to get low.  ED Course: patient CXR demonstrated PNA; work up also positive for mild DKA and patient meeting sepsis criteria. Cultures taken; IVF's resuscitation initiated; IV antibiotics started and patient initiated on glucostabilizer. Positive influenza PCR  Review of Systems:  All other systems reviewed and apart from HPI, are negative.  Past Medical History:  Diagnosis Date  . Anxiety   . Arthritis    LEFT HIP  . Asthma    HOSPITALIZED WITH EXCERBATION OF ASTHMA - AND BRONCHITIS AND THE FLU DEC 2013  . Depression   . Diabetes mellitus    ON INSULIN AND ORAL MEDICATIONS  . GERD (gastroesophageal reflux disease)   . HIV infection ([name]) 2000  . Hypertension   . Neuropathy    NEUROPATHY HANDS AND FEET  . Pain    SEVERE PAIN LEFT HIP - HX OF LEFT HIP PINNING JULY 2013  . Shortness of breath    ALLERGIES ARE "ACTING UP"    Past Surgical History:  Procedure Laterality Date  . AMPUTATION Left 11/18/2014   Procedure: LEFT BELOW KNEE AMPUTATION;  Surgeon: Leandrew Koyanagi, MD;  Location: [place];  Service: Orthopedics;  Laterality: Left;  .  CAST APPLICATION Left 02/09/9369   Procedure: CAST APPLICATION (FIBERGLASS);  Surgeon: Latanya Maudlin, MD;  Location: [facility];  Service: Orthopedics;  Laterality: Left;  CLOSED REDUCTION OF LEFT COLLES FRACTURE WITH SHORT ARM CAST  . HARDWARE REMOVAL Left 05/05/2014   Procedure: HARDWARE REMOVAL LEFT HIP;  Surgeon: Latanya Maudlin, MD;  Location: [facility];  Service: Orthopedics;  Laterality: Left;  REMOVAL BIOMET 6.5-8.0 CANNULATED SCREW  . HIP ARTHROPLASTY Left 05/05/2014   Procedure: ARTHROPLASTY OPEN REDUCTION INTERNAL FIXATION LEFT HIP AND REMOVAL OF TWO CANNULATED SCREW;  Surgeon: Latanya Maudlin, MD;  Location: [facility];  Service: Orthopedics;  Laterality: Left;  . HIP PINNING,CANNULATED  07/16/2011   Procedure: CANNULATED HIP PINNING;  Surgeon: Gearlean Alf, MD;  Location: [facility];  Service: Orthopedics;  Laterality: Left;  . HIP PINNING,CANNULATED Left 04/09/2012   Procedure: CANNULATED HIP PINNING AND HARDWARE REVISION;  Surgeon: Gearlean Alf, MD;  Location: [facility];  Service: Orthopedics;  Laterality: Left;  . I&D EXTREMITY Left 11/16/2014   Procedure:  DEBRIDEMENT OF LEFT FOOT POSSIBLE APPLICATION OF INTEGRIA AND VAC ;  Surgeon: Irene Limbo, MD;  Location: [place];  Service: Plastics;  Laterality: Left;  . PERIPHERAL VASCULAR CATHETERIZATION N/A 11/15/2014   Procedure: Abdominal Aortogram;  Surgeon: Conrad [place], MD;  Location: [place] [facility];  Service: Cardiovascular;  Laterality: N/A;  . PERIPHERAL VASCULAR CATHETERIZATION  11/15/2014   Procedure: Lower Extremity Angiography;  Surgeon: Conrad [place], MD;  Location:  [place] [facility];  Service: Cardiovascular;;  . PERIPHERAL VASCULAR CATHETERIZATION Left 11/15/2014   Procedure: Peripheral Vascular Intervention;  Surgeon: Conrad Luquillo, MD;  Location: [place] [facility];  Service: Cardiovascular;  Laterality: Left;  popliteal artery stenting    Social History  reports that she has been smoking E-cigarettes and Cigarettes.  She has a 17.50  pack-year smoking history. She has never used smokeless tobacco. She reports that she does not drink alcohol or use drugs.  Allergies  Allergen Reactions  . Cortizone-10 [Hydrocortisone] Rash    Per patient, given local injection at knee and developed rash at local site.     Family History  Problem Relation Age of Onset  . Diabetes Mother   . Hypertension Mother   . Vision loss Mother   . Heart disease Mother   . Hypertension Father   . Thyroid disease Neg Hx      Prior to Admission medications   Medication Sig Start Date End Date Taking? Authorizing Provider  ARIPiprazole (ABILIFY) 10 MG tablet Take 10 mg by mouth daily. 10/18/14  Yes Historical Provider, MD  aspirin EC 325 MG EC tablet Take 1 tablet (325 mg total) by mouth daily. 11/22/14  Yes Thurnell Lose, MD  atenolol (TENORMIN) 25 MG tablet Take 25 mg by mouth daily.  02/09/16  Yes Historical Provider, MD  budesonide-formoterol (SYMBICORT) 80-4.5 MCG/ACT inhaler Inhale 2 puffs into the lungs 2 (two) times daily. 01/01/12  Yes Hosie Poisson, MD  Cholecalciferol (VITAMIN D3) 5000 UNITS TABS Take 5,000 Units by mouth daily.    Yes Historical Provider, MD  clonazePAM (KLONOPIN) 0.5 MG tablet Take 0.5 tablets (0.25 mg total) by mouth 3 (three) times daily as needed (anxiety). Patient taking differently: Take 0.5 mg by mouth 3 (three) times daily as needed for anxiety (anxiety).  11/22/14  Yes Thurnell Lose, MD  cyclobenzaprine (FLEXERIL) 5 MG tablet Take 2 tablets (10 mg total) by mouth 3 (three) times daily as needed for muscle spasms. 11/22/14  Yes Thurnell Lose, MD  furosemide (LASIX) 80 MG tablet TK 1 T PO QD 02/15/16  Yes Historical Provider, MD  GENVOYA 150-150-200-10 MG TABS tablet TAKE 1 TABLET BY MOUTH EVERY DAY 12/19/15  Yes Campbell Riches, MD  HUMULIN R U-500 KWIKPEN 500 UNIT/ML kwikpen ADMINISTER DOSES 30 MINUTES BEFORE EACH MEAL. USE 60 UNITS BEFORE BREAKFAST, 50 UNITS BEFORE LUNCH AND 35 UNITS BEFORE DINNER  01/30/16  Yes Elayne Snare, MD  HYDROcodone-acetaminophen (NORCO) 10-325 MG tablet Take 1 tablet by mouth every 6 (six) hours as needed for moderate pain or severe pain. 11/21/14  Yes Thurnell Lose, MD  metFORMIN (GLUCOPHAGE-XR) 750 MG 24 hr tablet Take 1 tablet (750 mg total) by mouth daily with breakfast. 03/07/16  Yes Elayne Snare, MD  pantoprazole (PROTONIX) 40 MG tablet Take 40 mg by mouth daily. 09/02/14  Yes Historical Provider, MD  rosuvastatin (CRESTOR) 20 MG tablet Take 20 mg by mouth daily. Reported on 05/02/2015 09/02/14  Yes Historical Provider, MD  tiZANidine (ZANAFLEX) 4 MG tablet TK 1 T PO Q 8 H 03/07/16  Yes Historical Provider, MD  TOUJEO SOLOSTAR 300 UNIT/ML SOPN INJECT 70 UNITS UNDER THE SKIN TWICE DAILY 03/15/16  Yes Elayne Snare, MD  VICTOZA 18 MG/3ML SOPN ADMINISTER 1.8 MG UNDER THE SKIN DAILY 03/31/16  Yes Elayne Snare, MD  vitamin E (VITAMIN E) 1000 UNIT capsule Take 1,000 Units by mouth daily.    Yes Historical Provider, MD  diphenoxylate-atropine (LOMOTIL)  2.5-0.025 MG tablet Take 1 tablet by mouth 4 (four) times daily as needed for diarrhea or loose stools. 06/08/15   Campbell Riches, MD  eszopiclone (LUNESTA) 2 MG TABS tablet Take 3 mg by mouth at bedtime as needed for sleep. Take immediately before bedtime    Historical Provider, MD  feeding supplement, GLUCERNA SHAKE, (GLUCERNA SHAKE) LIQD Take 237 mLs by mouth 3 (three) times daily between meals. Patient not taking: Reported on 03/05/2016 05/11/14   Velvet Bathe, MD  fenofibrate (TRICOR) 145 MG tablet Take 1 tablet (145 mg total) by mouth daily. Patient not taking: Reported on 04/04/2016 01/06/13   Campbell Riches, MD  GENVOYA 150-150-200-10 MG TABS tablet TAKE 1 TABLET BY MOUTH EVERY DAY Patient not taking: Reported on 04/04/2016 01/21/16   Campbell Riches, MD  glucose blood (ONETOUCH VERIO) test strip Use to test blood sugar 3 times daily 03/05/16   Elayne Snare, MD  Insulin Pen Needle (NOVOTWIST) 32G X 5 MM MISC Use 2 per day to inject  Toujeo and Victoza 11/11/14   Elayne Snare, MD  NOVOLOG 100 UNIT/ML injection ADMINISTER 15 TO 20 UNITS UNDER THE SKIN BEFORE MEALS Patient not taking: Reported on 04/04/2016 02/25/15   Elayne Snare, MD    Physical Exam: Vitals:   04/04/16 1738 04/04/16 1745 04/04/16 1800 04/04/16 1830  BP: (!) 134/92  (!) 146/73   Pulse: (!) 113 (!) 111 (!) 51 (!) 118  Resp: (!) 24     Temp: 99.6 F (37.6 C)     TempSrc: Oral     SpO2: 94% (!) 89% (!) 75% 93%   Constitutional: afebrile, no CP; complaining of pain all over and not feeling good. Using [place] supplementation. Eyes: PERTLA, lids and conjunctivae normal, no icterus ENMT: Mucous membranes are moist. Posterior pharynx clear of any exudate or lesions. Normal dentition.  Neck: supple, no masses, no thyromegaly, no JVD Respiratory: increased rhonchi R>L; no wheezing, fair air movement; no crackles.  No accessory muscle use.  Cardiovascular: S1 & S2 heard, regular rhythm, no murmurs / rubs / gallops. No extremity edema. 2+ pedal pulses. No carotid bruits.  Abdomen: No distension, no tenderness, no masses palpated. No hepatosplenomegaly. Bowel sounds normal.  Musculoskeletal: no clubbing / cyanosis. Left BKA, no ulcers on her slump. No joint deformity upper extremities. Good ROM, no contractures. Normal muscle tone.  Skin: no rashes, lesions, ulcers. No induration Neurologic: CN 2-12 grossly intact. Sensation intact, DTR normal. Strength 5/5 in all 4 limbs.  Psychiatric: Normal judgment and insight. Alert and oriented x 3. Normal mood.     Labs on Admission: I have personally reviewed following labs and imaging studies  CBC:  Recent Labs Lab 04/04/16 1352 04/04/16 1355  WBC 10.1  --   NEUTROABS 8.4*  --   HGB 14.5 16.0*  HCT 42.3 47.0*  MCV 89.8  --   PLT 233  --    Basic Metabolic Panel:  Recent Labs Lab 04/04/16 1352 04/04/16 1355  NA 131* 131*  K 4.6 5.0  CL 96* 98*  CO2 19*  --   GLUCOSE 423* 423*  BUN 25* 34*  CREATININE 1.44*  1.10*  CALCIUM 9.5  --    Liver Function Tests:  Recent Labs Lab 04/04/16 1352  AST 27  ALT 29  ALKPHOS 76  BILITOT 1.1  PROT 8.2*  ALBUMIN 3.3*   CBG:  Recent Labs Lab 04/04/16 1430 04/04/16 1612 04/04/16 1754  GLUCAP 373* 358* 279*   Urine analysis:  Component Value Date/Time   COLORURINE YELLOW 04/04/2016 1509   APPEARANCEUR CLEAR 04/04/2016 1509   LABSPEC 1.027 04/04/2016 1509   PHURINE 5.0 04/04/2016 1509   GLUCOSEU >=500 (A) 04/04/2016 1509   GLUCOSEU 250 (A) 03/16/2015 1612   HGBUR MODERATE (A) 04/04/2016 1509   BILIRUBINUR NEGATIVE 04/04/2016 1509   KETONESUR 80 (A) 04/04/2016 1509   PROTEINUR 100 (A) 04/04/2016 1509   UROBILINOGEN 0.2 03/16/2015 1612   NITRITE NEGATIVE 04/04/2016 1509   LEUKOCYTESUR NEGATIVE 04/04/2016 1509    Radiological Exams on Admission: Dg Chest Port 1 View  Result Date: 04/04/2016 CLINICAL DATA:  Cough, shortness of breath for about 3 days, fever, HIV EXAM: PORTABLE CHEST 1 VIEW COMPARISON:  11/16/2014 FINDINGS: Cardiomediastinal silhouette is stable. There is reticular diffuse bilateral interstitial prominence. Atypical pneumonitis or mild interstitial edema cannot be excluded. Stable bilateral old rib fractures. No segmental infiltrate IMPRESSION: Reticular diffuse mild interstitial prominence bilaterally suspicious for atypical pneumonitis or interstitial edema. No segmental infiltrate. Old bilateral rib fractures are again noted. Electronically Signed   By: Lahoma Crocker M.D.   On: 04/04/2016 13:34    EKG:  No EKG on admission; one has been ordered   Assessment/Plan 1-Sepsis [facility]): due to PNA and influenza infection -admitted to stepdown, as patient needed IV insulin -will check blood cx's, sputum cx's, urine culture and will check for strpe/legionella antigen -will provide PRN nebulizer -started on rocephin and zithromax -PRN oxygen supplementation -will follow clinical response -continue supportive care   2-Human  immunodeficiency virus (HIV) disease ([place]) -continue home antiretroviral therapy -will check CD4 count  3-Diabetes mellitus type 2 with complications, uncontrolled ([place]): chronically on insulin therapy and currently on mild DKA -most likely triggered by acute CAP/influenza infection -will follow DKA protocol -follow BMET -IVF's and adjustment on hypoglycemic regimen as needed  -CBG's in 400 range, Anion Gap 16 and CO2 19 -recent A1C 10.3  4-Essential hypertension -will hold lasix for now -continue atenolol  5-Diabetic neuropathy ([name]) and chronic pain: -chronically on Norco and zanaflex  -continue control of her diabetes  6-depression: -will continue abilify  -no SI or hallucinations  7-COPD -continue spiriva and pulmicort -PRN Duoneb ordered  8-obesity -low calorie diet and exercise discussed with patient  9-chronic diarrhea -will continue lomotil as needed   10-HLD -continue Crestor  11-CKD stage 3-4: due to diabetes -stable and at baseline -will monitor Cr trend    Time: 70 minutes   DVT prophylaxis: Heparin   Code Status: Full  Family Communication: no family at bedside   Disposition Plan: home when medically stable; anticipated discharge in 3-4 days Consults called: none  Admission status: inpatient, LOS > 2 midnights, stepdown bed    Barton Dubois MD [facility] Pager [phone]  If 7PM-7AM, please contact night-coverage [email or web address] Password [facility]  04/04/2016, 6:48 PM

## 2016-04-04 NOTE — ED Provider Notes (Signed)
[facility] DEPT Provider Note   CSN: [id number] Arrival date & time: 04/04/16  1250     History   Chief Complaint Chief Complaint  Patient presents with  . Shortness of Breath  . Hyperglycemia    HPI [name] is a 51 y.o. female.  HPI   Pt with hx HIV, DM, asthma, HTN p/w 3-4 days of fever to 101, myalgias, cough productive of green and yellow sputum, SOB, diarrhea, occasional vomiting.  Blood sugar has been in the 300s.  Unsure of last CD4 count.  Denies urinary symptoms, skin infections.  No flu vx this season.  Not out of any medications.  PCP Dr Veryl Speak MDs.  Per chart, last CD4 count 870, viral load undetectable - 05/2015   Past Medical History:  Diagnosis Date  . Anxiety   . Arthritis    LEFT HIP  . Asthma    HOSPITALIZED WITH EXCERBATION OF ASTHMA - AND BRONCHITIS AND THE FLU DEC 2013  . Depression   . Diabetes mellitus    ON INSULIN AND ORAL MEDICATIONS  . GERD (gastroesophageal reflux disease)   . HIV infection ([place]) 2000  . Hypertension   . Neuropathy    NEUROPATHY HANDS AND FEET  . Pain    SEVERE PAIN LEFT HIP - HX OF LEFT HIP PINNING JULY 2013  . Shortness of breath    ALLERGIES ARE "ACTING UP"    Patient Active Problem List   Diagnosis Date Noted  . Sepsis ([place]) 04/04/2016  . DKA, type 2 ([place]) 04/04/2016  . Influenza B   . Diastolic dysfunction 29/51/8841  . Diabetic neuropathy ([place]) 11/10/2014  . Chronic kidney disease (CKD), stage IV (severe) ([place]) 11/10/2014  . Fracture of distal femur ([place]) 07/08/2014  . Osteoporosis 07/08/2014  . Femur fracture, left ([place]) 07/08/2014  . Colles' fracture of left radius   . Left hip pain 05/03/2014  . Hip pain   . CAP (community acquired pneumonia) 07/25/2012  . Perimenopausal symptoms 02/13/2012  . Hyponatremia 12/29/2011  . Asthma exacerbation attacks 12/28/2011  . Bronchitis 12/28/2011  . Repeated falls 07/30/2011  . Hip fracture, left ([place]) 07/16/2011  . Femoral neck fracture  ([place]) 07/16/2011  . Dyslipidemia 12/23/2008  . DIARRHEA 08/30/2008  . FATIGUE 07/12/2008  . THRUSH 05/20/2008  . Human immunodeficiency virus (HIV) disease ([place]) 04/19/2008  . Diabetes mellitus type 2 with complications, uncontrolled ([place]) 04/19/2008  . Mood disorder ([place]) 04/19/2008  . Hereditary and idiopathic peripheral neuropathy 04/19/2008  . Essential hypertension 04/19/2008    Past Surgical History:  Procedure Laterality Date  . AMPUTATION Left 11/18/2014   Procedure: LEFT BELOW KNEE AMPUTATION;  Surgeon: Leandrew Koyanagi, MD;  Location: [facility];  Service: Orthopedics;  Laterality: Left;  . CAST APPLICATION Left 06/06/628   Procedure: CAST APPLICATION (FIBERGLASS);  Surgeon: Latanya Maudlin, MD;  Location: [facility];  Service: Orthopedics;  Laterality: Left;  CLOSED REDUCTION OF LEFT COLLES FRACTURE WITH SHORT ARM CAST  . HARDWARE REMOVAL Left 05/05/2014   Procedure: HARDWARE REMOVAL LEFT HIP;  Surgeon: Latanya Maudlin, MD;  [facility];  Service: Orthopedics;  Laterality: Left;  REMOVAL BIOMET 6.5-8.0 CANNULATED SCREW  . HIP ARTHROPLASTY Left 05/05/2014   Procedure: ARTHROPLASTY OPEN REDUCTION INTERNAL FIXATION LEFT HIP AND REMOVAL OF TWO CANNULATED SCREW;  Surgeon: Latanya Maudlin, MD;  [facility];  Service: Orthopedics;  Laterality: Left;  . HIP PINNING,CANNULATED  07/16/2011   Procedure: CANNULATED HIP PINNING;  Surgeon: Gearlean Alf, MD;  Location: [facility];  Service: Orthopedics;  Laterality: Left;  . HIP PINNING,CANNULATED Left 04/09/2012   Procedure: CANNULATED HIP PINNING AND HARDWARE REVISION;  Surgeon: Gearlean Alf, MD;  [facility];  Service: Orthopedics;  Laterality: Left;  . I&D EXTREMITY Left 11/16/2014   Procedure:  DEBRIDEMENT OF LEFT FOOT POSSIBLE APPLICATION OF INTEGRIA AND VAC ;  Surgeon: Irene Limbo, MD;  Location: [facility];  Service: Plastics;  Laterality: Left;  . PERIPHERAL VASCULAR CATHETERIZATION N/A 11/15/2014   Procedure: Abdominal Aortogram;   Surgeon: Conrad Blakely, MD;  Location: [facility];  Service: Cardiovascular;  Laterality: N/A;  . PERIPHERAL VASCULAR CATHETERIZATION  11/15/2014   Procedure: Lower Extremity Angiography;  Surgeon: Conrad Cody, MD;  Location: [facility];  Service: Cardiovascular;;  . PERIPHERAL VASCULAR CATHETERIZATION Left 11/15/2014   Procedure: Peripheral Vascular Intervention;  Surgeon: Conrad , MD;  Location: [facility];  Service: Cardiovascular;  Laterality: Left;  popliteal artery stenting    OB History    Gravida Para Term Preterm AB Living   2 1 1  0 1 1   SAB TAB Ectopic Multiple Live Births   1   0 0         Home Medications    Prior to Admission medications   Medication Sig Start Date End Date Taking? Authorizing Provider  ARIPiprazole (ABILIFY) 10 MG tablet Take 10 mg by mouth daily. 10/18/14  Yes Historical Provider, MD  aspirin EC 325 MG EC tablet Take 1 tablet (325 mg total) by mouth daily. 11/22/14  Yes Thurnell Lose, MD  atenolol (TENORMIN) 25 MG tablet Take 25 mg by mouth daily.  02/09/16  Yes Historical Provider, MD  budesonide-formoterol (SYMBICORT) 80-4.5 MCG/ACT inhaler Inhale 2 puffs into the lungs 2 (two) times daily. 01/01/12  Yes Hosie Poisson, MD  Cholecalciferol (VITAMIN D3) 5000 UNITS TABS Take 5,000 Units by mouth daily.    Yes Historical Provider, MD  clonazePAM (KLONOPIN) 0.5 MG tablet Take 0.5 tablets (0.25 mg total) by mouth 3 (three) times daily as needed (anxiety). Patient taking differently: Take 0.5 mg by mouth 3 (three) times daily as needed for anxiety (anxiety).  11/22/14  Yes Thurnell Lose, MD  cyclobenzaprine (FLEXERIL) 5 MG tablet Take 2 tablets (10 mg total) by mouth 3 (three) times daily as needed for muscle spasms. 11/22/14  Yes Thurnell Lose, MD  furosemide (LASIX) 80 MG tablet TK 1 T PO QD 02/15/16  Yes Historical Provider, MD  GENVOYA 150-150-200-10 MG TABS tablet TAKE 1 TABLET BY MOUTH EVERY DAY 12/19/15  Yes Campbell Riches, MD  HUMULIN R U-500 KWIKPEN 500 UNIT/ML kwikpen ADMINISTER DOSES 30 MINUTES BEFORE EACH MEAL. USE 60 UNITS BEFORE BREAKFAST, 50 UNITS BEFORE LUNCH AND 35 UNITS BEFORE DINNER 01/30/16  Yes Elayne Snare, MD  HYDROcodone-acetaminophen (NORCO) 10-325 MG tablet Take 1 tablet by mouth every 6 (six) hours as needed for moderate pain or severe pain. 11/21/14  Yes Thurnell Lose, MD  metFORMIN (GLUCOPHAGE-XR) 750 MG 24 hr tablet Take 1 tablet (750 mg total) by mouth daily with breakfast. 03/07/16  Yes Elayne Snare, MD  pantoprazole (PROTONIX) 40 MG tablet Take 40 mg by mouth daily. 09/02/14  Yes Historical Provider, MD  rosuvastatin (CRESTOR) 20 MG tablet Take 20 mg by mouth daily. Reported on 05/02/2015 09/02/14  Yes Historical Provider, MD  tiZANidine (ZANAFLEX) 4 MG tablet TK 1 T PO Q 8 H 03/07/16  Yes Historical Provider, MD  Obie Dredge  300 UNIT/ML SOPN INJECT 70 UNITS UNDER THE SKIN TWICE DAILY 03/15/16  Yes Elayne Snare, MD  VICTOZA 18 MG/3ML SOPN ADMINISTER 1.8 MG UNDER THE SKIN DAILY 03/31/16  Yes Elayne Snare, MD  vitamin E (VITAMIN E) 1000 UNIT capsule Take 1,000 Units by mouth daily.    Yes Historical Provider, MD  diphenoxylate-atropine (LOMOTIL) 2.5-0.025 MG tablet Take 1 tablet by mouth 4 (four) times daily as needed for diarrhea or loose stools. 06/08/15   Campbell Riches, MD  eszopiclone (LUNESTA) 2 MG TABS tablet Take 3 mg by mouth at bedtime as needed for sleep. Take immediately before bedtime    Historical Provider, MD  feeding supplement, GLUCERNA SHAKE, (GLUCERNA SHAKE) LIQD Take 237 mLs by mouth 3 (three) times daily between meals. Patient not taking: Reported on 03/05/2016 05/11/14   Velvet Bathe, MD  fenofibrate (TRICOR) 145 MG tablet Take 1 tablet (145 mg total) by mouth daily. Patient not taking: Reported on 04/04/2016 01/06/13   Campbell Riches, MD  GENVOYA 150-150-200-10 MG TABS tablet TAKE 1 TABLET BY MOUTH EVERY DAY Patient not taking: Reported on 04/04/2016 01/21/16   Campbell Riches, MD    glucose blood (ONETOUCH VERIO) test strip Use to test blood sugar 3 times daily 03/05/16   Elayne Snare, MD  Insulin Pen Needle (NOVOTWIST) 32G X 5 MM MISC Use 2 per day to inject Toujeo and Victoza 11/11/14   Elayne Snare, MD  NOVOLOG 100 UNIT/ML injection ADMINISTER 15 TO 20 UNITS UNDER THE SKIN BEFORE MEALS Patient not taking: Reported on 04/04/2016 02/25/15   Elayne Snare, MD    Family History Family History  Problem Relation Age of Onset  . Diabetes Mother   . Hypertension Mother   . Vision loss Mother   . Heart disease Mother   . Hypertension Father   . Thyroid disease Neg Hx     Social History Social History  Substance Use Topics  . Smoking status: Current Every Day Smoker    Packs/day: 0.50    Years: 35.00    Types: E-cigarettes, Cigarettes  . Smokeless tobacco: Never Used  . Alcohol use No     Comment: no drink since 2008     Allergies   Cortizone-10 [hydrocortisone]   Review of Systems Review of Systems   Physical Exam Updated Vital Signs BP (!) 167/77   Pulse (!) 113   Temp 99.3 F (37.4 C) (Oral)   Resp (!) 29   LMP 11/16/2010   SpO2 (!) 80%   Physical Exam  Constitutional: She appears well-developed and well-nourished. No distress.  obese  HENT:  Head: Normocephalic and atraumatic.  Neck: Neck supple.  Cardiovascular: Normal rate and regular rhythm.   Pulmonary/Chest: Effort normal. Tachypnea noted. No respiratory distress. She has decreased breath sounds. She has wheezes in the right lower field. She has rales (RLL) in the right lower field.  Abdominal: Soft. She exhibits no distension. There is no tenderness. There is no rebound and no guarding.  Musculoskeletal:  Left BKA.    Neurological: She is alert.  Skin: She is not diaphoretic.  Nursing note and vitals reviewed.    ED Treatments / Results  Labs (all labs ordered are listed, but only abnormal results are displayed) Labs Reviewed  COMPREHENSIVE METABOLIC PANEL - Abnormal; Notable for the  following:       Result Value   Sodium 131 (*)    Chloride 96 (*)    CO2 19 (*)    Glucose, Bld 423 (*)  BUN 25 (*)    Creatinine, Ser 1.44 (*)    Total Protein 8.2 (*)    Albumin 3.3 (*)    GFR calc non Af Amer 41 (*)    GFR calc Af Amer 47 (*)    Anion gap 16 (*)    All other components within normal limits  CBC WITH DIFFERENTIAL/PLATELET - Abnormal; Notable for the following:    Neutro Abs 8.4 (*)    All other components within normal limits  URINALYSIS, ROUTINE W REFLEX MICROSCOPIC - Abnormal; Notable for the following:    Glucose, UA >=500 (*)    Hgb urine dipstick MODERATE (*)    Ketones, ur 80 (*)    Protein, ur 100 (*)    Squamous Epithelial / LPF 0-5 (*)    All other components within normal limits  INFLUENZA PANEL BY PCR (TYPE A & B) - Abnormal; Notable for the following:    Influenza B By PCR POSITIVE (*)    All other components within normal limits  GLUCOSE, CAPILLARY - Abnormal; Notable for the following:    Glucose-Capillary 191 (*)    All other components within normal limits  I-STAT CG4 LACTIC ACID, ED - Abnormal; Notable for the following:    Lactic Acid, Venous 2.08 (*)    All other components within normal limits  I-STAT CHEM 8, ED - Abnormal; Notable for the following:    Sodium 131 (*)    Chloride 98 (*)    BUN 34 (*)    Creatinine, Ser 1.10 (*)    Glucose, Bld 423 (*)    Hemoglobin 16.0 (*)    HCT 47.0 (*)    All other components within normal limits  CBG MONITORING, ED - Abnormal; Notable for the following:    Glucose-Capillary 373 (*)    All other components within normal limits  CBG MONITORING, ED - Abnormal; Notable for the following:    Glucose-Capillary 358 (*)    All other components within normal limits  CBG MONITORING, ED - Abnormal; Notable for the following:    Glucose-Capillary 279 (*)    All other components within normal limits  CBG MONITORING, ED - Abnormal; Notable for the following:    Glucose-Capillary 239 (*)    All other  components within normal limits  CBG MONITORING, ED - Abnormal; Notable for the following:    Glucose-Capillary 195 (*)    All other components within normal limits  CULTURE, BLOOD (ROUTINE X 2)  CULTURE, BLOOD (ROUTINE X 2)  URINE CULTURE  CULTURE, EXPECTORATED SPUTUM-ASSESSMENT  GRAM STAIN  T-HELPER CELLS (CD4) COUNT (NOT AT [facility])  HIV ANTIBODY (ROUTINE TESTING)  STREP PNEUMONIAE URINARY ANTIGEN  CBC  CREATININE, SERUM  CBC  I-STAT CG4 LACTIC ACID, ED    EKG  EKG Interpretation None       Radiology Dg Chest Port 1 View  Result Date: 04/04/2016 CLINICAL DATA:  Cough, shortness of breath for about 3 days, fever, HIV EXAM: PORTABLE CHEST 1 VIEW COMPARISON:  11/16/2014 FINDINGS: Cardiomediastinal silhouette is stable. There is reticular diffuse bilateral interstitial prominence. Atypical pneumonitis or mild interstitial edema cannot be excluded. Stable bilateral old rib fractures. No segmental infiltrate IMPRESSION: Reticular diffuse mild interstitial prominence bilaterally suspicious for atypical pneumonitis or interstitial edema. No segmental infiltrate. Old bilateral rib fractures are again noted. Electronically Signed   By: Lahoma Crocker M.D.   On: 04/04/2016 13:34    Procedures Procedures (including critical care time)  Medications Ordered in ED Medications  atenolol (TENORMIN) tablet 25 mg (not administered)  tiZANidine (ZANAFLEX) tablet 4 mg (not administered)  elvitegravir-cobicistat-emtricitabine-tenofovir (GENVOYA) 150-150-200-10 MG tablet 1 tablet (not administered)  diphenoxylate-atropine (LOMOTIL) 2.5-0.025 MG per tablet 1 tablet (not administered)  ARIPiprazole (ABILIFY) tablet 10 mg (not administered)  aspirin EC tablet 325 mg (not administered)  clonazePAM (KLONOPIN) tablet 0.25 mg (not administered)  rosuvastatin (CRESTOR) tablet 20 mg (not administered)  pantoprazole (PROTONIX) EC tablet 40 mg (not administered)  HYDROcodone-acetaminophen (NORCO) 10-325 MG per  tablet 1 tablet (1 tablet Oral Given 04/04/16 2123)  zolpidem (AMBIEN) tablet 5 mg (not administered)  cholecalciferol (VITAMIN D) tablet 5,000 Units (not administered)  vitamin E capsule 1,000 Units (not administered)  budesonide (PULMICORT) nebulizer solution 0.25 mg (not administered)  ipratropium-albuterol (DUONEB) 0.5-2.5 (3) MG/3ML nebulizer solution 3 mL (not administered)  oseltamivir (TAMIFLU) capsule 75 mg (not administered)  insulin regular (NOVOLIN R,HUMULIN R) 250 Units in sodium chloride 0.9 % 250 mL (1 Units/mL) infusion (3.9 Units/hr Intravenous Rate/Dose Change 04/04/16 2115)  heparin injection 5,000 Units (not administered)  0.9 %  sodium chloride infusion (not administered)  cefTRIAXone (ROCEPHIN) 1 g in dextrose 5 % 50 mL IVPB (not administered)  azithromycin (ZITHROMAX) 500 mg in dextrose 5 % 250 mL IVPB (not administered)  dextrose 5 %-0.45 % sodium chloride infusion ( Intravenous New Bag/Given 04/04/16 2124)  sodium chloride 0.9 % bolus 500 mL (not administered)    And  0.9 %  sodium chloride infusion (not administered)  ondansetron (ZOFRAN) injection 4 mg (not administered)  sodium chloride 0.9 % bolus 1,000 mL (0 mLs Intravenous Stopped 04/04/16 1448)    And  sodium chloride 0.9 % bolus 1,000 mL (0 mLs Intravenous Stopped 04/04/16 1454)    And  sodium chloride 0.9 % bolus 1,000 mL (0 mLs Intravenous Stopped 04/04/16 1723)    And  sodium chloride 0.9 % bolus 500 mL (500 mLs Intravenous New Bag/Given 04/04/16 1724)  ipratropium-albuterol (DUONEB) 0.5-2.5 (3) MG/3ML nebulizer solution 3 mL (3 mLs Nebulization Given 04/04/16 1416)  morphine 2 MG/ML injection 4 mg (4 mg Intravenous Given 04/04/16 1415)  cefTRIAXone (ROCEPHIN) 1 g in dextrose 5 % 50 mL IVPB (0 g Intravenous Stopped 04/04/16 1457)  azithromycin (ZITHROMAX) 500 mg in dextrose 5 % 250 mL IVPB (0 mg Intravenous Stopped 04/04/16 1656)  HYDROcodone-acetaminophen (NORCO/VICODIN) 5-325 MG per tablet 1 tablet (1 tablet Oral Given  04/04/16 1655)  HYDROcodone-acetaminophen (NORCO/VICODIN) 5-325 MG per tablet 1 tablet (1 tablet Oral Given 04/04/16 1952)     Initial Impression / Assessment and Plan / ED Course  I have reviewed the triage vital signs and the nursing notes.  Pertinent labs & imaging results that were available during my care of the patient were reviewed by me and considered in my medical decision making (see chart for details).  Clinical Course as of Apr 04 2149  Wed Apr 04, 2016  1415 96% on 2L   [EW]    Clinical Course User Index [EW] Clayton Bibles, PA-C    Febrile, tachycardic, tachypneic, hypoxic patient found to have influenza and also likely early DKA.  She has rales at RLL that may reflect pneumonia that may appear with rehydration, pt given rocephin and azithromycin, 30cc/kg bolus in the ED under sepsis protocol.  Insulin per glucose stabilizer.  Admitted to [facility], Dr Dyann Kief accepting.    Final Clinical Impressions(s) / ED Diagnoses   Final diagnoses:  Sepsis, due to unspecified organism ([place])  Hyperglycemia  Respiratory infection  Influenza B  Diabetic ketoacidosis without coma associated with type 2 diabetes mellitus [facility])    New Prescriptions Current Discharge Medication List       Clayton Bibles, [place] 04/04/16 2152    Forde Dandy, MD 04/05/16 [phone]

## 2016-04-04 NOTE — ED Triage Notes (Signed)
She states she has had a cough, shortness of breath, and "just feel bad" x 2-3 days.

## 2016-04-05 ENCOUNTER — Inpatient Hospital Stay (HOSPITAL_COMMUNITY): Payer: Medicare HMO

## 2016-04-05 DIAGNOSIS — F329 Major depressive disorder, single episode, unspecified: Secondary | ICD-10-CM

## 2016-04-05 DIAGNOSIS — E1142 Type 2 diabetes mellitus with diabetic polyneuropathy: Secondary | ICD-10-CM

## 2016-04-05 LAB — GLUCOSE, CAPILLARY
GLUCOSE-CAPILLARY: 151 mg/dL — AB (ref 65–99)
GLUCOSE-CAPILLARY: 147 mg/dL — AB (ref 65–99)
GLUCOSE-CAPILLARY: 136 mg/dL — AB (ref 65–99)
GLUCOSE-CAPILLARY: 170 mg/dL — AB (ref 65–99)
GLUCOSE-CAPILLARY: 173 mg/dL — AB (ref 65–99)
GLUCOSE-CAPILLARY: 294 mg/dL — AB (ref 65–99)
Glucose-Capillary: 141 mg/dL — ABNORMAL HIGH (ref 65–99)
Glucose-Capillary: 146 mg/dL — ABNORMAL HIGH (ref 65–99)
Glucose-Capillary: 156 mg/dL — ABNORMAL HIGH (ref 65–99)
Glucose-Capillary: 157 mg/dL — ABNORMAL HIGH (ref 65–99)
Glucose-Capillary: 162 mg/dL — ABNORMAL HIGH (ref 65–99)
Glucose-Capillary: 163 mg/dL — ABNORMAL HIGH (ref 65–99)
Glucose-Capillary: 271 mg/dL — ABNORMAL HIGH (ref 65–99)

## 2016-04-05 LAB — BASIC METABOLIC PANEL
ANION GAP: 8 (ref 5–15)
BUN: 13 mg/dL (ref 6–20)
CALCIUM: 8.6 mg/dL — AB (ref 8.9–10.3)
CO2: 22 mmol/L (ref 22–32)
CREATININE: 0.91 mg/dL (ref 0.44–1.00)
Chloride: 104 mmol/L (ref 101–111)
Glucose, Bld: 151 mg/dL — ABNORMAL HIGH (ref 65–99)
Potassium: 3.5 mmol/L (ref 3.5–5.1)
SODIUM: 134 mmol/L — AB (ref 135–145)

## 2016-04-05 LAB — CBC
HCT: 37 % (ref 36.0–46.0)
Hemoglobin: 12.8 g/dL (ref 12.0–15.0)
MCH: 31.1 pg (ref 26.0–34.0)
MCHC: 34.6 g/dL (ref 30.0–36.0)
MCV: 89.8 fL (ref 78.0–100.0)
Platelets: 201 10*3/uL (ref 150–400)
RBC: 4.12 MIL/uL (ref 3.87–5.11)
RDW: 15.1 % (ref 11.5–15.5)
WBC: 10.8 10*3/uL — AB (ref 4.0–10.5)

## 2016-04-05 LAB — MAGNESIUM: Magnesium: 1.6 mg/dL — ABNORMAL LOW (ref 1.7–2.4)

## 2016-04-05 LAB — URINE CULTURE

## 2016-04-05 LAB — T-HELPER CELLS (CD4) COUNT (NOT AT ARMC)
CD4 % Helper T Cell: 25 % — ABNORMAL LOW (ref 33–55)
CD4 T CELL ABS: 80 /uL — AB (ref 400–2700)

## 2016-04-05 LAB — MRSA PCR SCREENING: MRSA by PCR: NEGATIVE

## 2016-04-05 MED ORDER — INSULIN GLARGINE 100 UNIT/ML ~~LOC~~ SOLN
30.0000 [IU] | Freq: Two times a day (BID) | SUBCUTANEOUS | Status: DC
  Administered 2016-04-05 – 2016-04-08 (×6): 30 [IU] via SUBCUTANEOUS
  Filled 2016-04-05 (×7): qty 0.3

## 2016-04-05 MED ORDER — INSULIN GLARGINE 100 UNIT/ML ~~LOC~~ SOLN
20.0000 [IU] | Freq: Once | SUBCUTANEOUS | Status: AC
  Administered 2016-04-05: 20 [IU] via SUBCUTANEOUS
  Filled 2016-04-05: qty 0.2

## 2016-04-05 MED ORDER — ELVITEG-COBIC-EMTRICIT-TENOFAF 150-150-200-10 MG PO TABS
1.0000 | ORAL_TABLET | Freq: Every day | ORAL | Status: DC
  Administered 2016-04-05 – 2016-04-09 (×5): 1 via ORAL
  Filled 2016-04-05 (×5): qty 1

## 2016-04-05 MED ORDER — INSULIN ASPART 100 UNIT/ML ~~LOC~~ SOLN
0.0000 [IU] | Freq: Three times a day (TID) | SUBCUTANEOUS | Status: DC
  Administered 2016-04-05: 11 [IU] via SUBCUTANEOUS
  Administered 2016-04-06: 7 [IU] via SUBCUTANEOUS
  Administered 2016-04-07: 4 [IU] via SUBCUTANEOUS
  Administered 2016-04-08: 11 [IU] via SUBCUTANEOUS
  Administered 2016-04-08 – 2016-04-09 (×2): 4 [IU] via SUBCUTANEOUS
  Administered 2016-04-09: 7 [IU] via SUBCUTANEOUS

## 2016-04-05 MED ORDER — IPRATROPIUM-ALBUTEROL 0.5-2.5 (3) MG/3ML IN SOLN
3.0000 mL | Freq: Four times a day (QID) | RESPIRATORY_TRACT | Status: DC
  Administered 2016-04-05 (×2): 3 mL via RESPIRATORY_TRACT
  Filled 2016-04-05 (×2): qty 3

## 2016-04-05 MED ORDER — SODIUM CHLORIDE 0.9 % IV SOLN
INTRAVENOUS | Status: DC
  Administered 2016-04-05: 3.3 [IU]/h via INTRAVENOUS
  Filled 2016-04-05: qty 2.5

## 2016-04-05 MED ORDER — MENTHOL 3 MG MT LOZG: 1.0000 | LOZENGE | OROMUCOSAL | Status: DC | PRN

## 2016-04-05 MED ORDER — INSULIN ASPART 100 UNIT/ML ~~LOC~~ SOLN
0.0000 [IU] | Freq: Every day | SUBCUTANEOUS | Status: DC
  Administered 2016-04-05: 3 [IU] via SUBCUTANEOUS

## 2016-04-05 NOTE — Care Management Note (Signed)
Case Management Note  Patient Details  Name: [name] MRN: [id number] Date of Birth: [birth year]  Subjective/Objective:       From home , hyperglyemia with uti and aki             Action/Plan:Date:  April 05, 2016 Chart reviewed for concurrent status and case management needs. Will continue to follow patient progress. Discharge Planning: following for needs Expected discharge date: [id number] Velva Harman, BSN, [place], [place]  Expected Discharge Date:   (unknown)               Expected Discharge Plan:  Home/Self Care  In-House Referral:     Discharge planning Services  CM Consult  Post Acute Care Choice:    Choice offered to:     DME Arranged:    DME Agency:     HH Arranged:    [place] Agency:     Status of Service:  In process, will continue to follow  If discussed at Long Length of Stay Meetings, dates discussed:    Additional Comments:  Leeroy Cha, RN 04/05/2016, 10:29 AM

## 2016-04-05 NOTE — Progress Notes (Signed)
Inpatient Diabetes Program Recommendations  AACE/ADA: New Consensus Statement on Inpatient Glycemic Control (2015)  Target Ranges:  Prepandial:   less than 140 mg/dL      Peak postprandial:   less than 180 mg/dL (1-2 hours)      Critically ill patients:  140 - 180 mg/dL   Lab Results  Component Value Date   GLUCAP 156 (H) 04/05/2016   HGBA1C 10.3 03/05/2016    Review of Glycemic Control  Diabetes history: DM2 Outpatient Diabetes medications: Toujeo 70 units bid, U-500 60-50-35 tidwc, metformin 750 mg QAM, Victoza 1.8 mg QD Current orders for Inpatient glycemic control: Lantus 30 units bid  Inpatient Diabetes Program Recommendations:    Add Novolog 0-20 units tidwc and hs Meal coverage insulin - Novolog 12 units tidwc (adjust on daily basis since on U-500 with meals at home.  HgbA1C of 10.3% indicates poor glycemic control at home. Sees Dr. Elayne Snare for endo.  To speak with pt this afternoon.  Thank you. [name], RD, LDN, CDE Inpatient Diabetes Coordinator [phone]

## 2016-04-05 NOTE — Progress Notes (Signed)
Nutrition Follow-up  DOCUMENTATION CODES:   Obesity unspecified  INTERVENTION:  - Continue to encourage PO intakes. - RD will monitor for needs at follow-up  NUTRITION DIAGNOSIS:   Inadequate oral intake related to acute illness, poor appetite as evidenced by per patient/family report.  GOAL:   Patient will meet greater than or equal to 90% of their needs  MONITOR:   PO intake, Weight trends, Labs  REASON FOR ASSESSMENT:   Malnutrition Screening Tool  ASSESSMENT:   51 y/o woman with PMH significant for HIV, HTN, HLD, uncontrolled Type 2 diabetes, obesity, COPD and tobacco abuse; who presented to ED complaining of fever/chills, generalized body aches, SOB and productive cough. Patient symptoms has been present for the last 2 days or so and worsening. Patient denies hemoptysis, abd pain, dysuria, hematuria, melena, hematochezia, hematemesis, focal weakness or any other complaints. Reports not been compliant with her insulin therapy in the last 2 days or so due to not feeling good and not eating well, with concerns to get low.  Pt seen for MST. BMI indicates obesity. No intakes documented since diet advancement from NPO to Carb Modified earlier this AM. Pt denies eating breakfast this AM and states that she is not feeling hungry d/t ongoing nausea, disinterest in eating. She is unable to state how long poor appetite has been ongoing. Pt does not feel up for conversation with RD at this time. No family/visitors present. Per notes, pt with hx of uncontrolled DM with recent HgbA1c: 10.3%; she also has hx of HIV and is on ART.  Unable to perform physical assessment at this time per pt request. On admission, pt had reported 25 lb weight loss but she was unsure of the time frame for this weight loss. Per chart review, weight on 06/13/15 and 10/05/15 both recorded as 166 lbs (120.7 kg); since that time, pt has has lost 26 lbs (10% body weight) which is not significant if lost from June but is  significant if lost from October; will continue to monitor and document further findings at follow-up.  Medications reviewed; 5000 units vitamin D/day, 10 units Lantus x1 dose today, 30 units Lantus BID, 40 mg oral Protonix/day, 1000 units oral vitamin E/day.  Labs reviewed; CBGs: 136-163 mg/dL, Na: 134 mmol/L, Ca: 8.6 mg/dL.  IVF: NS @ 100 mL/hr.   Diet Order:  Diet Carb Modified Fluid consistency: Thin; Room service appropriate? Yes  Skin:   WDL  Last BM:  4/4  Height:   Ht Readings from Last 1 Encounters:  03/05/16 5\' 10"  (1.778 m)    Weight:   Wt Readings from Last 1 Encounters:  03/05/16 250 lb (113.4 kg)    Ideal Body Weight:  68.18 kg  BMI:  35.89 kg/m2  Estimated Nutritional Needs:   Kcal:  1700-1930 (15-17 kcal/kg)  Protein:  90-110 grams (~0.8-1 grams/kg)  Fluid:  >/= 2 L/day  EDUCATION NEEDS:   No education needs identified at this time    Jarome Matin, MS, RD, LDN, CNSC Inpatient Clinical Dietitian Pager # [phone] After hours/weekend pager # [phone]

## 2016-04-05 NOTE — Progress Notes (Signed)
PT demonstrated hands on understanding of Flutter device. 

## 2016-04-05 NOTE — Progress Notes (Signed)
[facility] PROGRESS NOTE  [name] SNK:[id number] DOB: 18-Oct-1965 DOA: 04/04/2016 PCP: Michel Harrow, PA-C  Interim summary and HPI 51 y/o woman with PMH significant for HIV, HTN, HLD, uncontrolled Type 2 diabetes, obesity, COPD and tobacco abuse; who presented to ED complaining of fever/chills, generalized body aches, SOB and productive cough. Found to have DKA, CAP and positive influenza. Patient met sepsis criteria on admission.  Assessment/Plan: 1-Sepsis ([place]): due to PNA and influenza infection -remains in stepdown for today; still with significant SOB and requiring higher [place] supplementation  -will follow blood cx's, sputum cx's, urine culture and will check for strep/legionella antigen -will continue PRN nebulizer -continue on rocephin and zithromax -continue PRN oxygen supplementation -will follow clinical response -continue supportive care   2-Human immunodeficiency virus (HIV) disease ([place]) -continue home antiretroviral therapy -will follow CD4 count -ID notified and curbside   3-Diabetes mellitus type 2 with complications, uncontrolled ([place]): chronically on insulin therapy -DKA on admission most likely triggered by acute CAP/influenza infection -will follow BMET and CBG's -further adjustment on hypoglycemic regimen as needed  -CBG's now < 200 range for more than 3 occasions, Anion Gap 8 and CO2 22 -will transition off IV insulin and start modified carb diet -recent A1C 10.3  4-Essential hypertension -will continue holding lasix for now -continue atenolol  5-Diabetic neuropathy ([name]) and chronic pain: -chronically on Norco and zanaflex; will continue home pain meds -continue control of her diabetes  6-depression: -will continue abilify  -no SI or hallucinations  7-COPD -continue spiriva and pulmicort -PRN Duoneb ordered -no wheezing   8-obesity -low calorie diet and exercise discussed with patient  9-chronic diarrhea -will  continue lomotil as needed   10-HLD -continue Crestor  11-CKD stage 3-4: due to diabetes -stable and at baseline -will monitor Cr trend    Code Status: Full Family Communication: No family at bedside  Disposition Plan: will transition off IV insulin, continue IV antibiotics, follow cultures; continue    Consultants:  ID curbside (Dr. Johnnye Sima)  Procedures:  See below for x-ray reports   Antibiotics:  Rocephin and zithromax 04/04/16  Tamiflu 04/04/16  HPI/Subjective: Afebrile, still SOB, requiring 4-6L [place] supplementation and complaining of generalized aches and general malaise.   Objective: Vitals:   04/05/16 0600 04/05/16 0700  BP: (!) 165/76   Pulse:    Resp: 19   Temp:  (!) 96.9 F (36.1 C)    Intake/Output Summary (Last 24 hours) at 04/05/16 0900 Last data filed at 04/04/16 1908  Gross per 24 hour  Intake             3500 ml  Output              450 ml  Net             3050 ml   There were no vitals filed for this visit.  Exam:   General:  Afebrile today; still complaining of generalized body aches, general malaise, SOB and sore throat. No CP. Requiring 4-6L of O2 supplementation.  Cardiovascular: S1 and S2, no rubs, no gallops  Respiratory: improved air movement, positive tachypnea, no using accessory muscles. Patient without wheezing and/or crackles.  Abdomen: obee, soft, NT, ND, positive BS  Musculoskeletal: Left BKA, no cyanosis or clubbing   Data Reviewed: Basic Metabolic Panel:  Recent Labs Lab 04/04/16 1352 04/04/16 1355 04/04/16 2211 04/05/16 0334  NA 131* 131*  --  134*  K 4.6 5.0  --  3.5  CL 96* 98*  --  104  CO2 19*  --   --  22  GLUCOSE 423* 423*  --  151*  BUN 25* 34*  --  13  CREATININE 1.44* 1.10* 0.95 0.91  CALCIUM 9.5  --   --  8.6*   Liver Function Tests:  Recent Labs Lab 04/04/16 1352  AST 27  ALT 29  ALKPHOS 76  BILITOT 1.1  PROT 8.2*  ALBUMIN 3.3*   CBC:  Recent Labs Lab 04/04/16 1352  04/04/16 1355 04/04/16 2211 04/05/16 0346  WBC 10.1  --  10.3 10.8*  NEUTROABS 8.4*  --   --   --   HGB 14.5 16.0* 12.2 12.8  HCT 42.3 47.0* 35.6* 37.0  MCV 89.8  --  90.1 89.8  PLT 233  --  205 201   CBG:  Recent Labs Lab 04/05/16 0430 04/05/16 0535 04/05/16 0643 04/05/16 0749 04/05/16 0857  GLUCAP 163* 147* 141* 136* 170*    Recent Results (from the past 240 hour(s))  MRSA PCR Screening     Status: None   Collection Time: 04/04/16 10:47 PM  Result Value Ref Range Status   MRSA by PCR NEGATIVE NEGATIVE Final    Comment:        The GeneXpert MRSA Assay (FDA approved for NASAL specimens only), is one component of a comprehensive MRSA colonization surveillance program. It is not intended to diagnose MRSA infection nor to guide or monitor treatment for MRSA infections.      Studies: Dg Chest 2 View  Result Date: 04/05/2016 CLINICAL DATA:  Shortness of breath, history of asthma, HIV, diabetes, current smoker. EXAM: CHEST  2 VIEW COMPARISON:  Portable chest x-ray of April 04, 2016 FINDINGS: The lungs are slightly less well inflated today. The interstitial markings remain increased bilaterally but are less conspicuous overall today. There are coarse left infrahilar lung markings more conspicuous today however. There is no pleural effusion or pneumothorax. The heart is top-normal in size. The central pulmonary vascularity is prominent. There is old deformity of the posterior aspects of the fifth and sixth ribs on the right. IMPRESSION: Slight interval improvement in the appearance of the pulmonary interstitium consistent with decreased interstitial edema or pneumonia. Patchy confluent density in the left infrahilar region is compatible with pneumonia or subsegmental atelectasis. Electronically Signed   By: David  [place] M.D.   On: 04/05/2016 08:31   Dg Chest Port 1 View  Result Date: 04/04/2016 CLINICAL DATA:  Cough, shortness of breath for about 3 days, fever, HIV EXAM:  PORTABLE CHEST 1 VIEW COMPARISON:  11/16/2014 FINDINGS: Cardiomediastinal silhouette is stable. There is reticular diffuse bilateral interstitial prominence. Atypical pneumonitis or mild interstitial edema cannot be excluded. Stable bilateral old rib fractures. No segmental infiltrate IMPRESSION: Reticular diffuse mild interstitial prominence bilaterally suspicious for atypical pneumonitis or interstitial edema. No segmental infiltrate. Old bilateral rib fractures are again noted. Electronically Signed   By: Natasha Mead M.D.   On: 04/04/2016 13:34    Scheduled Meds: . ARIPiprazole  10 mg Oral Daily  . aspirin  325 mg Oral Daily  . atenolol  25 mg Oral Daily  . azithromycin  500 mg Intravenous Q24H  . budesonide (PULMICORT) nebulizer solution  0.25 mg Nebulization BID  . cefTRIAXone (ROCEPHIN)  IV  1 g Intravenous Q24H  . cholecalciferol  5,000 Units Oral Daily  . elvitegravir-cobicistat-emtricitabine-tenofovir  1 tablet Oral Q breakfast  . heparin  5,000 Units Subcutaneous Q8H  . insulin glargine  20 Units Subcutaneous Once  . insulin  glargine  30 Units Subcutaneous BID  . ipratropium-albuterol  3 mL Nebulization Q6H  . oseltamivir  75 mg Oral BID  . pantoprazole  40 mg Oral Daily  . rosuvastatin  20 mg Oral Daily  . vitamin E  1,000 Units Oral Daily   Continuous Infusions: . sodium chloride    . insulin (NOVOLIN-R) infusion      Time spent: 30 minutes   Barton Dubois  [facility] Pager [phone]. If 7PM-7AM, please contact night-coverage at [email or web address], password [email or web address] 04/05/2016, 9:00 AM  LOS: 1 day

## 2016-04-06 DIAGNOSIS — Z89512 Acquired absence of left leg below knee: Secondary | ICD-10-CM

## 2016-04-06 DIAGNOSIS — Z888 Allergy status to other drugs, medicaments and biological substances status: Secondary | ICD-10-CM

## 2016-04-06 DIAGNOSIS — Z821 Family history of blindness and visual loss: Secondary | ICD-10-CM

## 2016-04-06 DIAGNOSIS — E669 Obesity, unspecified: Secondary | ICD-10-CM

## 2016-04-06 DIAGNOSIS — J1000 Influenza due to other identified influenza virus with unspecified type of pneumonia: Secondary | ICD-10-CM

## 2016-04-06 DIAGNOSIS — Z79899 Other long term (current) drug therapy: Secondary | ICD-10-CM

## 2016-04-06 DIAGNOSIS — Z833 Family history of diabetes mellitus: Secondary | ICD-10-CM

## 2016-04-06 DIAGNOSIS — Z21 Asymptomatic human immunodeficiency virus [HIV] infection status: Secondary | ICD-10-CM

## 2016-04-06 DIAGNOSIS — F17210 Nicotine dependence, cigarettes, uncomplicated: Secondary | ICD-10-CM

## 2016-04-06 DIAGNOSIS — J189 Pneumonia, unspecified organism: Secondary | ICD-10-CM

## 2016-04-06 DIAGNOSIS — E662 Morbid (severe) obesity with alveolar hypoventilation: Secondary | ICD-10-CM

## 2016-04-06 DIAGNOSIS — Z8249 Family history of ischemic heart disease and other diseases of the circulatory system: Secondary | ICD-10-CM

## 2016-04-06 DIAGNOSIS — E119 Type 2 diabetes mellitus without complications: Secondary | ICD-10-CM

## 2016-04-06 DIAGNOSIS — J101 Influenza due to other identified influenza virus with other respiratory manifestations: Secondary | ICD-10-CM

## 2016-04-06 DIAGNOSIS — Z8349 Family history of other endocrine, nutritional and metabolic diseases: Secondary | ICD-10-CM

## 2016-04-06 DIAGNOSIS — G4733 Obstructive sleep apnea (adult) (pediatric): Secondary | ICD-10-CM

## 2016-04-06 LAB — CBC
HCT: 36.5 % (ref 36.0–46.0)
HEMOGLOBIN: 12.5 g/dL (ref 12.0–15.0)
MCH: 31.2 pg (ref 26.0–34.0)
MCHC: 34.2 g/dL (ref 30.0–36.0)
MCV: 91 fL (ref 78.0–100.0)
Platelets: 228 10*3/uL (ref 150–400)
RBC: 4.01 MIL/uL (ref 3.87–5.11)
RDW: 15.6 % — ABNORMAL HIGH (ref 11.5–15.5)
WBC: 13.1 10*3/uL — ABNORMAL HIGH (ref 4.0–10.5)

## 2016-04-06 LAB — MAGNESIUM: MAGNESIUM: 1.5 mg/dL — AB (ref 1.7–2.4)

## 2016-04-06 LAB — BASIC METABOLIC PANEL
ANION GAP: 10 (ref 5–15)
BUN: 18 mg/dL (ref 6–20)
CALCIUM: 8.4 mg/dL — AB (ref 8.9–10.3)
CO2: 24 mmol/L (ref 22–32)
CREATININE: 1.06 mg/dL — AB (ref 0.44–1.00)
Chloride: 104 mmol/L (ref 101–111)
GFR, EST NON AFRICAN AMERICAN: 59 mL/min — AB (ref >60)
Glucose, Bld: 205 mg/dL — ABNORMAL HIGH (ref 65–99)
Potassium: 3 mmol/L — ABNORMAL LOW (ref 3.5–5.1)
Sodium: 138 mmol/L (ref 135–145)

## 2016-04-06 LAB — GLUCOSE, CAPILLARY
GLUCOSE-CAPILLARY: 201 mg/dL — AB (ref 65–99)
GLUCOSE-CAPILLARY: 119 mg/dL — AB (ref 65–99)
GLUCOSE-CAPILLARY: 167 mg/dL — AB (ref 65–99)
Glucose-Capillary: 111 mg/dL — ABNORMAL HIGH (ref 65–99)

## 2016-04-06 LAB — HIV 1/2 AB DIFFERENTIATION
HIV 1 AB: POSITIVE — AB
HIV 2 AB: NEGATIVE

## 2016-04-06 LAB — STREP PNEUMONIAE URINARY ANTIGEN: Strep Pneumo Urinary Antigen: NEGATIVE

## 2016-04-06 LAB — HIV ANTIBODY (ROUTINE TESTING W REFLEX): HIV SCREEN 4TH GENERATION: REACTIVE — AB

## 2016-04-06 MED ORDER — POTASSIUM CHLORIDE CRYS ER 20 MEQ PO TBCR
40.0000 meq | EXTENDED_RELEASE_TABLET | ORAL | Status: AC
  Administered 2016-04-06 (×2): 40 meq via ORAL
  Filled 2016-04-06 (×3): qty 2

## 2016-04-06 MED ORDER — BUDESONIDE 0.5 MG/2ML IN SUSP
0.5000 mg | Freq: Two times a day (BID) | RESPIRATORY_TRACT | Status: DC
  Administered 2016-04-06 – 2016-04-09 (×6): 0.5 mg via RESPIRATORY_TRACT
  Filled 2016-04-06 (×6): qty 2

## 2016-04-06 MED ORDER — DEXTROMETHORPHAN POLISTIREX ER 30 MG/5ML PO SUER
30.0000 mg | Freq: Two times a day (BID) | ORAL | Status: DC
  Administered 2016-04-06 – 2016-04-09 (×7): 30 mg via ORAL
  Filled 2016-04-06 (×8): qty 5

## 2016-04-06 MED ORDER — IPRATROPIUM-ALBUTEROL 0.5-2.5 (3) MG/3ML IN SOLN
3.0000 mL | Freq: Three times a day (TID) | RESPIRATORY_TRACT | Status: DC
Start: 2016-04-06 — End: 2016-04-07
  Administered 2016-04-06 – 2016-04-07 (×2): 3 mL via RESPIRATORY_TRACT
  Filled 2016-04-06 (×4): qty 3

## 2016-04-06 MED ORDER — INSULIN ASPART 100 UNIT/ML ~~LOC~~ SOLN
12.0000 [IU] | Freq: Three times a day (TID) | SUBCUTANEOUS | Status: DC
  Administered 2016-04-06 – 2016-04-08 (×6): 12 [IU] via SUBCUTANEOUS

## 2016-04-06 NOTE — Progress Notes (Signed)
Pt refused CPAP qhs.  Education provided.  Pt encouraged to contact RT if she changes her mind.   

## 2016-04-06 NOTE — Consult Note (Signed)
[facility]    Date of Admission:  04/04/2016           Day 3 oseltamivir        Day 3 ceftriaxone        Day 3 azithromycin       Reason for Consult: Influenza B with possible superimposed bacterial pneumonia    Referring Physician: Dr. Barton Dubois  Principal Problem:   Influenza B Active Problems:   CAP (community acquired pneumonia)   Human immunodeficiency virus (HIV) disease ([place])   Diabetes mellitus type 2 with complications, uncontrolled ([place])   Essential hypertension   Diabetic neuropathy ([place])   Sepsis ([place])   DKA, type 2 ([place])   Obesity hypoventilation syndrome ([place])   . ARIPiprazole  10 mg Oral Daily  . aspirin  325 mg Oral Daily  . atenolol  25 mg Oral Daily  . azithromycin  500 mg Intravenous Q24H  . budesonide (PULMICORT) nebulizer solution  0.25 mg Nebulization BID  . cefTRIAXone (ROCEPHIN)  IV  1 g Intravenous Q24H  . cholecalciferol  5,000 Units Oral Daily  . elvitegravir-cobicistat-emtricitabine-tenofovir  1 tablet Oral Q breakfast  . heparin  5,000 Units Subcutaneous Q8H  . insulin aspart  0-20 Units Subcutaneous TID WC  . insulin aspart  0-5 Units Subcutaneous QHS  . insulin aspart  12 Units Subcutaneous TID WC  . insulin glargine  30 Units Subcutaneous BID  . ipratropium-albuterol  3 mL Nebulization TID  . oseltamivir  75 mg Oral BID  . pantoprazole  40 mg Oral Daily  . potassium chloride  40 mEq Oral Q4H  . rosuvastatin  20 mg Oral Daily  . vitamin E  1,000 Units Oral Daily    Recommendations: 1. Continue current antimicrobial therapy 2. Continue Genvoya 3. Await results of HIV viral load   Assessment: I believe [name]'s acute illness is due to influenza B possibly with superimposed community-acquired pneumonia. Although her CD4 count has plummeted I suspect that this is simply an acute reaction to her infection that is not likely to put her at great risk for opportunistic infection. Her infection  has been well controlled in the past and it sounds like her adherence remains excellent. She is improving on current therapy and I would continue it for now. Please call for any infectious disease questions this weekend.    HPI: [name] is a 51 y.o. female with diabetes, obesity, obstructive sleep apnea and HIV infection who recently developed fever, chills, productive cough, worsening shortness of breath and myalgias leading to admission on 04/04/2016. She has low-grade fever of 100.5. Her chest x-ray revealed diffuse interstitial markings. She was influenza B positive. She was started on oseltamivir and empiric antibiotic therapy for possible community-acquired pneumonia. Yesterday's chest x-ray showed improvement but with residual left lower lobe infiltrate. Her oxygen requirements are less today.  She takes Genvoya every day for her HIV infection. Her mother administers all of her medications to her. She denies missing any doses. When she was last seen in our clinic last June her CD4 count was 870 and her viral load was undetectable at less than 20. When her CD4 was checked here it was down to 80 but the percent CD4 lymphocytes remained relatively unchanged at 25%. Her repeat viral load is pending   Review of Systems: Review of Systems  Constitutional: Positive for chills, fever and malaise/fatigue. Negative for diaphoresis and weight loss.  HENT:  Negative for sore throat.   Respiratory: Positive for cough, sputum production and shortness of breath. Negative for hemoptysis and wheezing.   Cardiovascular: Negative for chest pain.  Gastrointestinal: Negative for abdominal pain, diarrhea, heartburn, nausea and vomiting.  Genitourinary: Negative for dysuria and frequency.  Musculoskeletal: Positive for myalgias.  Skin: Negative for rash.    Past Medical History:  Diagnosis Date  . Anxiety   . Arthritis    LEFT HIP  . Asthma    HOSPITALIZED WITH EXCERBATION OF ASTHMA - AND  BRONCHITIS AND THE FLU DEC 2013  . Depression   . Diabetes mellitus    ON INSULIN AND ORAL MEDICATIONS  . GERD (gastroesophageal reflux disease)   . HIV infection ([place]) 2000  . Hypertension   . Neuropathy    NEUROPATHY HANDS AND FEET  . Pain    SEVERE PAIN LEFT HIP - HX OF LEFT HIP PINNING JULY 2013  . Shortness of breath    ALLERGIES ARE "ACTING UP"    Social History  Substance Use Topics  . Smoking status: Current Every Day Smoker    Packs/day: 0.50    Years: 35.00    Types: E-cigarettes, Cigarettes  . Smokeless tobacco: Never Used  . Alcohol use No     Comment: no drink since 2008    Family History  Problem Relation Age of Onset  . Diabetes Mother   . Hypertension Mother   . Vision loss Mother   . Heart disease Mother   . Hypertension Father   . Thyroid disease Neg Hx    Allergies  Allergen Reactions  . Cortizone-10 [Hydrocortisone] Rash    Per patient, given local injection at knee and developed rash at local site.     OBJECTIVE: Blood pressure 140/80, pulse 83, temperature 98.3 F (36.8 C), temperature source Oral, resp. rate (!) 26, last menstrual period 11/16/2010, SpO2 95 %.  Physical Exam  Constitutional: She is oriented to person, place, and time.  She is uncomfortable and asking for her inhaler. She has a frequent dry cough.  Cardiovascular: Normal rate and regular rhythm.   No murmur heard. Pulmonary/Chest: She has no wheezes. She has rales.  She is tachypneic. Few crackles in her bases posteriorly.  Musculoskeletal: Normal range of motion. She exhibits no edema or tenderness.  Left BKA.  Neurological: She is alert and oriented to person, place, and time.  Skin: No rash noted.  Psychiatric: Mood and affect normal.    Lab Results Lab Results  Component Value Date   WBC 13.1 (H) 04/06/2016   HGB 12.5 04/06/2016   HCT 36.5 04/06/2016   MCV 91.0 04/06/2016   PLT 228 04/06/2016    Lab Results  Component Value Date   CREATININE 1.06 (H)  04/06/2016   BUN 18 04/06/2016   NA 138 04/06/2016   K 3.0 (L) 04/06/2016   CL 104 04/06/2016   CO2 24 04/06/2016    Lab Results  Component Value Date   ALT 29 04/04/2016   AST 27 04/04/2016   ALKPHOS 76 04/04/2016   BILITOT 1.1 04/04/2016     Microbiology: Recent Results (from the past 240 hour(s))  Blood Culture (routine x 2)     Status: None (Preliminary result)   Collection Time: 04/04/16  1:50 PM  Result Value Ref Range Status   Specimen Description BLOOD LEFT ANTECUBITAL  Final   Special Requests   Final    BOTTLES DRAWN AEROBIC AND ANAEROBIC Blood Culture adequate volume   Culture  Final    NO GROWTH < 24 HOURS Performed at [facility], [place] [address] [place], [place] [address]    Report Status PENDING  Incomplete  Urine culture     Status: Abnormal   Collection Time: 04/04/16  3:09 PM  Result Value Ref Range Status   Specimen Description URINE, RANDOM  Final   Special Requests NONE  Final   Culture MULTIPLE SPECIES PRESENT, SUGGEST RECOLLECTION (A)  Final   Report Status 04/05/2016 FINAL  Final  Blood Culture (routine x 2)     Status: None (Preliminary result)   Collection Time: 04/04/16 10:11 PM  Result Value Ref Range Status   Specimen Description BLOOD RIGHT HAND  Final   Special Requests   Final    BOTTLES DRAWN AEROBIC ONLY Blood Culture adequate volume   Culture   Final    NO GROWTH < 24 HOURS Performed at [facility], [place] [address], [place], [place] [address]    Report Status PENDING  Incomplete  MRSA PCR Screening     Status: None   Collection Time: 04/04/16 10:47 PM  Result Value Ref Range Status   MRSA by PCR NEGATIVE NEGATIVE Final    Comment:        The GeneXpert MRSA Assay (FDA approved for NASAL specimens only), is one component of a comprehensive MRSA colonization surveillance program. It is not intended to diagnose MRSA infection nor to guide or monitor treatment for MRSA infections.     Michel Bickers,  MD [facility] [phone] pager   [phone] cell 04/06/2016, 12:23 PM

## 2016-04-06 NOTE — Progress Notes (Signed)
Pt refused her 08:00 neb tx. No distress noted at this time. Pt states she wants to sleep.

## 2016-04-06 NOTE — Consult Note (Signed)
Name: [name] MRN: [id number] DOB: 27-Nov-1965    ADMISSION DATE:  04/04/2016 CONSULTATION DATE:  04/06/16   REFERRING MD :  Dr. Dyann Kief   CHIEF COMPLAINT:  PNA    HISTORY OF PRESENT ILLNESS:  51 y/o F, current smoker, admitted 4/4 with cough, SOB and feeling poorly for 2-3 days.  Work up positive for influenza B and possible bacterial PNA.  Patient has a history of GERD, DM, anxiety/depression, HTN, HIV (on Genvoya, last CD4 6/202017 870, viral load undetectable at less than 20)) and asthma on symbicort.  She was admitted by [facility] and treated for influenza with Tamiflu.  Lactic acid was negative and she was hemodynamically stable.  CXR was concerning for CAP and she was treated with IV antibiotics (rocephin / azithro).  She continued to have a dry cough.  O2 needs weaned to 2L on 4/6 and pending transfer to medical floor.  CXR on 4/5 showed improved aeration.    She continued to have tachypnea and PCCM consulted for pulmonary evaluation. Labs 4/6 - WBC 13.1, Hgb 12.5, platelets 228, Na 138, glucose 205, sr cr 1.06.     PAST MEDICAL HISTORY :   has a past medical history of Anxiety; Arthritis; Asthma; Depression; Diabetes mellitus; GERD (gastroesophageal reflux disease); HIV infection ([place]) (2000); Hypertension; Neuropathy; Pain; and Shortness of breath.   has a past surgical history that includes Hip pinning, cannulated (07/16/2011); Hip pinning, cannulated (Left, 04/09/2012); Hip Arthroplasty (Left, 05/05/2014); Cast application (Left, 04/06/6597); Hardware Removal (Left, 05/05/2014); Cardiac catheterization (N/A, 11/15/2014); Cardiac catheterization (11/15/2014); Cardiac catheterization (Left, 11/15/2014); I&D extremity (Left, 11/16/2014); and Amputation (Left, 11/18/2014).  Prior to Admission medications   Medication Sig Start Date End Date Taking? Authorizing Provider  ARIPiprazole (ABILIFY) 10 MG tablet Take 10 mg by mouth daily. 10/18/14  Yes Historical Provider, MD  aspirin EC 325 MG EC  tablet Take 1 tablet (325 mg total) by mouth daily. 11/22/14  Yes Thurnell Lose, MD  atenolol (TENORMIN) 25 MG tablet Take 25 mg by mouth daily.  02/09/16  Yes Historical Provider, MD  budesonide-formoterol (SYMBICORT) 80-4.5 MCG/ACT inhaler Inhale 2 puffs into the lungs 2 (two) times daily. 01/01/12  Yes Hosie Poisson, MD  Cholecalciferol (VITAMIN D3) 5000 UNITS TABS Take 5,000 Units by mouth daily.    Yes Historical Provider, MD  clonazePAM (KLONOPIN) 0.5 MG tablet Take 0.5 tablets (0.25 mg total) by mouth 3 (three) times daily as needed (anxiety). Patient taking differently: Take 0.5 mg by mouth 3 (three) times daily as needed for anxiety (anxiety).  11/22/14  Yes Thurnell Lose, MD  cyclobenzaprine (FLEXERIL) 5 MG tablet Take 2 tablets (10 mg total) by mouth 3 (three) times daily as needed for muscle spasms. 11/22/14  Yes Thurnell Lose, MD  furosemide (LASIX) 80 MG tablet TK 1 T PO QD 02/15/16  Yes Historical Provider, MD  GENVOYA 150-150-200-10 MG TABS tablet TAKE 1 TABLET BY MOUTH EVERY DAY 12/19/15  Yes Campbell Riches, MD  HUMULIN R U-500 KWIKPEN 500 UNIT/ML kwikpen ADMINISTER DOSES 30 MINUTES BEFORE EACH MEAL. USE 60 UNITS BEFORE BREAKFAST, 50 UNITS BEFORE LUNCH AND 35 UNITS BEFORE DINNER 01/30/16  Yes Elayne Snare, MD  HYDROcodone-acetaminophen (NORCO) 10-325 MG tablet Take 1 tablet by mouth every 6 (six) hours as needed for moderate pain or severe pain. 11/21/14  Yes Thurnell Lose, MD  metFORMIN (GLUCOPHAGE-XR) 750 MG 24 hr tablet Take 1 tablet (750 mg total) by mouth daily with breakfast. 03/07/16  Yes Ajay  Dwyane Dee, MD  pantoprazole (PROTONIX) 40 MG tablet Take 40 mg by mouth daily. 09/02/14  Yes Historical Provider, MD  rosuvastatin (CRESTOR) 20 MG tablet Take 20 mg by mouth daily. Reported on 05/02/2015 09/02/14  Yes Historical Provider, MD  tiZANidine (ZANAFLEX) 4 MG tablet TK 1 T PO Q 8 H 03/07/16  Yes Historical Provider, MD  TOUJEO SOLOSTAR 300 UNIT/ML SOPN INJECT 70 UNITS UNDER THE SKIN  TWICE DAILY 03/15/16  Yes Elayne Snare, MD  VICTOZA 18 MG/3ML SOPN ADMINISTER 1.8 MG UNDER THE SKIN DAILY 03/31/16  Yes Elayne Snare, MD  vitamin E (VITAMIN E) 1000 UNIT capsule Take 1,000 Units by mouth daily.    Yes Historical Provider, MD  diphenoxylate-atropine (LOMOTIL) 2.5-0.025 MG tablet Take 1 tablet by mouth 4 (four) times daily as needed for diarrhea or loose stools. 06/08/15   Campbell Riches, MD  eszopiclone (LUNESTA) 2 MG TABS tablet Take 3 mg by mouth at bedtime as needed for sleep. Take immediately before bedtime    Historical Provider, MD  feeding supplement, GLUCERNA SHAKE, (GLUCERNA SHAKE) LIQD Take 237 mLs by mouth 3 (three) times daily between meals. Patient not taking: Reported on 03/05/2016 05/11/14   Velvet Bathe, MD  fenofibrate (TRICOR) 145 MG tablet Take 1 tablet (145 mg total) by mouth daily. Patient not taking: Reported on 04/04/2016 01/06/13   Campbell Riches, MD  GENVOYA 150-150-200-10 MG TABS tablet TAKE 1 TABLET BY MOUTH EVERY DAY Patient not taking: Reported on 04/04/2016 01/21/16   Campbell Riches, MD  glucose blood (ONETOUCH VERIO) test strip Use to test blood sugar 3 times daily 03/05/16   Elayne Snare, MD  Insulin Pen Needle (NOVOTWIST) 32G X 5 MM MISC Use 2 per day to inject Toujeo and Victoza 11/11/14   Elayne Snare, MD  NOVOLOG 100 UNIT/ML injection ADMINISTER 15 TO 20 UNITS UNDER THE SKIN BEFORE MEALS Patient not taking: Reported on 04/04/2016 02/25/15   Elayne Snare, MD    Allergies  Allergen Reactions  . Cortizone-10 [Hydrocortisone] Rash    Per patient, given local injection at knee and developed rash at local site.     FAMILY HISTORY:  family history includes Diabetes in her mother; Heart disease in her mother; Hypertension in her father and mother; Vision loss in her mother.  SOCIAL HISTORY:  reports that she has been smoking E-cigarettes and Cigarettes.  She has a 17.50 pack-year smoking history. She has never used smokeless tobacco. She reports that she does not  drink alcohol or use drugs.  REVIEW OF SYSTEMS:  Positives in BOLD Constitutional: Negative for fever, chills, weight loss, malaise/fatigue and diaphoresis.  HENT: Negative for hearing loss, ear pain, nosebleeds, congestion, sore throat, neck pain, tinnitus and ear discharge.   Eyes: Negative for blurred vision, double vision, photophobia, pain, discharge and redness.  Respiratory: Negative for dry cough, hemoptysis, sputum production, shortness of breath, wheezing and stridor.   Cardiovascular: Negative for chest pain, palpitations, orthopnea, claudication, leg swelling and PND.  Gastrointestinal: Negative for heartburn, nausea, vomiting, abdominal pain, diarrhea, constipation, blood in stool and melena.  Genitourinary: Negative for dysuria, urgency, frequency, hematuria and flank pain.  Musculoskeletal: Negative for myalgias, back pain, joint pain and falls.  Skin: Negative for itching and rash.  Neurological: Negative for dizziness, tingling, tremors, sensory change, speech change, focal weakness, seizures, loss of consciousness, weakness and headaches.  Endo/Heme/Allergies: Negative for environmental allergies and polydipsia. Does not bruise/bleed easily.  SUBJECTIVE:   VITAL SIGNS: Temp:  [97.5 F (36.4 C)-98.6  F (37 C)] 98.3 F (36.8 C) (04/06 0800) Pulse Rate:  [77] 77 (04/06 0800) Resp:  [19-36] 26 (04/06 1139) BP: (92-178)/(46-97) 140/80 (04/06 1138) SpO2:  [78 %-100 %] 95 % (04/06 1139)  PHYSICAL EXAMINATION: General: morbidly obese female in NAD HEENT: MM pink/moist PSY: anxious Neuro: AAOx4, speech clear, MAE CV: s1s2 rrr, no m/r/g PULM: even/non-labored, lungs bilaterally coarse, occasional wheeze BM:WUXL, non-tender, bsx4 active  Extremities: warm/dry, no edema, L BKA Skin: no rashes or lesions    Recent Labs Lab 04/04/16 1352 04/04/16 1355 04/04/16 2211 04/05/16 0334 04/06/16 0339  NA 131* 131*  --  134* 138  K 4.6 5.0  --  3.5 3.0*  CL 96* 98*  --   104 104  CO2 19*  --   --  22 24  BUN 25* 34*  --  13 18  CREATININE 1.44* 1.10* 0.95 0.91 1.06*  GLUCOSE 423* 423*  --  151* 205*     Recent Labs Lab 04/04/16 2211 04/05/16 0346 04/06/16 0339  HGB 12.2 12.8 12.5  HCT 35.6* 37.0 36.5  WBC 10.3 10.8* 13.1*  PLT 205 201 228    Dg Chest 2 View  Result Date: 04/05/2016 CLINICAL DATA:  Shortness of breath, history of asthma, HIV, diabetes, current smoker. EXAM: CHEST  2 VIEW COMPARISON:  Portable chest x-ray of April 04, 2016 FINDINGS: The lungs are slightly less well inflated today. The interstitial markings remain increased bilaterally but are less conspicuous overall today. There are coarse left infrahilar lung markings more conspicuous today however. There is no pleural effusion or pneumothorax. The heart is top-normal in size. The central pulmonary vascularity is prominent. There is old deformity of the posterior aspects of the fifth and sixth ribs on the right. IMPRESSION: Slight interval improvement in the appearance of the pulmonary interstitium consistent with decreased interstitial edema or pneumonia. Patchy confluent density in the left infrahilar region is compatible with pneumonia or subsegmental atelectasis. Electronically Signed   By: David  Martinique M.D.   On: 04/05/2016 08:31   Dg Chest Port 1 View  Result Date: 04/04/2016 CLINICAL DATA:  Cough, shortness of breath for about 3 days, fever, HIV EXAM: PORTABLE CHEST 1 VIEW COMPARISON:  11/16/2014 FINDINGS: Cardiomediastinal silhouette is stable. There is reticular diffuse bilateral interstitial prominence. Atypical pneumonitis or mild interstitial edema cannot be excluded. Stable bilateral old rib fractures. No segmental infiltrate IMPRESSION: Reticular diffuse mild interstitial prominence bilaterally suspicious for atypical pneumonitis or interstitial edema. No segmental infiltrate. Old bilateral rib fractures are again noted. Electronically Signed   By: Lahoma Crocker M.D.   On:  04/04/2016 13:34      SIGNIFICANT EVENTS  4/04  Admit with Influenza B, CAP  4/06  PCCM consulted   STUDIES:      ASSESSMENT / PLAN:  Discussion: 51 y/o HIV positive female admitted with influenza B and CAP in the setting of baseline asthma + ongoing tobacco abuse.  Responding well to therapies, O2 weaned to 2L.  CXR improved 4/5.   Influenza B Positive  CAP Asthma  Tobacco Abuse  Anxiety    Plan: Continue tamiflu, D3/5 Continue abx for CAP, D3/7  Push pulmonary hygiene - IS, mobilize  Intermittent CXR  Wean O2 to off for sats 90-95% Adjust pulmicort to 0.5 mg BID dosing   Continue duonebs > pt has been refusing Smoking cessation  Add delsym for cough  Defer PCP coverage need to ID (agree she does not need currently) Control anxiety   PCCM  will be available PRN.  Please call back if new needs arise.   Noe Gens, NP-C [facility] Pgr: [phone] or if no answer [phone] 04/06/2016, 11:50 AM

## 2016-04-06 NOTE — Progress Notes (Signed)
[facility] PROGRESS NOTE  [name] IYM:[id number] DOB: 10-24-1965 DOA: 04/04/2016 PCP: Michel Harrow, PA-C  Interim summary and HPI 51 y/o woman with PMH significant for HIV, HTN, HLD, uncontrolled Type 2 diabetes, obesity, COPD and tobacco abuse; who presented to ED complaining of fever/chills, generalized body aches, SOB and productive cough. Found to have DKA, CAP and positive influenza. Patient met sepsis criteria on admission.  Assessment/Plan: 1-Sepsis ([place]): due to PNA and influenza infection -will transfer to telemetry bed -following rec's from PCCM will use QHS CPAP trial -continue weaning O2 supplementation as tolerated -will continue current IV antibiotics and follow DI rec's -will follow blood cx's, sputum cx's, urine culture and will check for strep/legionella antigen -will continue PRN nebulizer -will follow clinical response -continue supportive care   2-Human immunodeficiency virus (HIV) disease ([place]) -continue home antiretroviral therapy -CD4 count in the 80 range; previously 870 -ID formally consulted now; -will follow rec's -high risk for opportunistic infections and concerns if adjustments needs to be done to her antibiotic regimen    3-Diabetes mellitus type 2 with complications, uncontrolled ([place]): chronically on insulin therapy -DKA on admission most likely triggered by acute CAP/influenza infection -will follow BMET and CBG's -further adjustment on hypoglycemic regimen as needed  -CBG's rising after diet advanced. -will continue SSI, continue lantus and add meal coverage. -continue modified carb diet -recent A1C 10.3  4-Essential hypertension -will continue holding lasix for today and resume tomorrow -continue atenolol  5-Diabetic neuropathy ([name]) and chronic pain: -chronically on Norco and zanaflex; will continue home pain meds -continue control of her diabetes  6-depression: -will continue abilify  -no SI or  hallucinations  7-COPD -continue spiriva and pulmicort -PRN Duoneb ordered -very little wheezing on exam  8-obesity -low calorie diet and exercise discussed with patient -BMI around 35 -with concerns for OSA -after discussing with pulmonary service, will try trial of CPAP  9-chronic diarrhea -will continue lomotil as needed   10-HLD -continue Crestor  11-CKD stage 3-4: due to diabetes -stable and at baseline -will monitor Cr trend    Code Status: Full Family Communication: No family at bedside  Disposition Plan: will transfer to telemetry bed; continue adjusting hypoglycemic regimen, continue IV antibiotics, follow cultures; continue Tamiflu and start CPAP trial QHS.    Consultants:  ID curbside (Dr. Johnnye Sima); now Dr. Megan Salon formally consulted given drop in CD4 count  Pulmonary service   Procedures:  See below for x-ray reports   Antibiotics:  Rocephin and zithromax 04/04/16  Tamiflu 04/04/16  HPI/Subjective: Afebrile, still with SOB symptoms and requiring 4L [place] supplementation. Even continue complaining of generalized aches and general malaise, is feeling better overall.   Objective: Vitals:   04/06/16 0600 04/06/16 0800  BP: (!) 96/59 103/69  Pulse:  77  Resp:  (!) 27  Temp:  98.3 F (36.8 C)    Intake/Output Summary (Last 24 hours) at 04/06/16 1058 Last data filed at 04/06/16 0800  Gross per 24 hour  Intake          2757.52 ml  Output              100 ml  Net          2657.52 ml   There were no vitals filed for this visit.  Exam:   General:  Afebrile today; feeling somewhat better; still complaining of generalized body aches and malaise. Also tahypneic and with SOB sensation. Requiring 4L of O2 supplementation.  Cardiovascular: S1 and S2, no rubs, no  gallops  Respiratory: improved air movement, positive tachypnea, no using accessory muscles. Patient with slight end exp wheezing; No crackles.  Abdomen: obee, soft, NT, ND, positive  BS  Musculoskeletal: Left BKA, no cyanosis or clubbing   Data Reviewed: Basic Metabolic Panel:  Recent Labs Lab 04/04/16 1352 04/04/16 1355 04/04/16 2211 04/05/16 0334 04/05/16 1445 04/06/16 0339  NA 131* 131*  --  134*  --  138  K 4.6 5.0  --  3.5  --  3.0*  CL 96* 98*  --  104  --  104  CO2 19*  --   --  22  --  24  GLUCOSE 423* 423*  --  151*  --  205*  BUN 25* 34*  --  13  --  18  CREATININE 1.44* 1.10* 0.95 0.91  --  1.06*  CALCIUM 9.5  --   --  8.6*  --  8.4*  MG  --   --   --   --  1.6* 1.5*   Liver Function Tests:  Recent Labs Lab 04/04/16 1352  AST 27  ALT 29  ALKPHOS 76  BILITOT 1.1  PROT 8.2*  ALBUMIN 3.3*   CBC:  Recent Labs Lab 04/04/16 1352 04/04/16 1355 04/04/16 2211 04/05/16 0346 04/06/16 0339  WBC 10.1  --  10.3 10.8* 13.1*  NEUTROABS 8.4*  --   --   --   --   HGB 14.5 16.0* 12.2 12.8 12.5  HCT 42.3 47.0* 35.6* 37.0 36.5  MCV 89.8  --  90.1 89.8 91.0  PLT 233  --  205 201 228   CBG:  Recent Labs Lab 04/05/16 1034 04/05/16 1136 04/05/16 1651 04/05/16 2113 04/06/16 0808  GLUCAP 173* 156* 271* 294* 201*    Recent Results (from the past 240 hour(s))  Blood Culture (routine x 2)     Status: None (Preliminary result)   Collection Time: 04/04/16  1:50 PM  Result Value Ref Range Status   Specimen Description BLOOD LEFT ANTECUBITAL  Final   Special Requests   Final    BOTTLES DRAWN AEROBIC AND ANAEROBIC Blood Culture adequate volume   Culture   Final    NO GROWTH < 24 HOURS Performed at [facility], [address], [place], [place] [address]    Report Status PENDING  Incomplete  Urine culture     Status: Abnormal   Collection Time: 04/04/16  3:09 PM  Result Value Ref Range Status   Specimen Description URINE, RANDOM  Final   Special Requests NONE  Final   Culture MULTIPLE SPECIES PRESENT, SUGGEST RECOLLECTION (A)  Final   Report Status 04/05/2016 FINAL  Final  Blood Culture (routine x 2)     Status: None (Preliminary  result)   Collection Time: 04/04/16 10:11 PM  Result Value Ref Range Status   Specimen Description BLOOD RIGHT HAND  Final   Special Requests   Final    BOTTLES DRAWN AEROBIC ONLY Blood Culture adequate volume   Culture   Final    NO GROWTH < 24 HOURS Performed at [facility], [place] [address] [place], [place] [address]    Report Status PENDING  Incomplete  MRSA PCR Screening     Status: None   Collection Time: 04/04/16 10:47 PM  Result Value Ref Range Status   MRSA by PCR NEGATIVE NEGATIVE Final    Comment:        The GeneXpert MRSA Assay (FDA approved for NASAL specimens only),  is one component of a comprehensive MRSA colonization surveillance program. It is not intended to diagnose MRSA infection nor to guide or monitor treatment for MRSA infections.      Studies: Dg Chest 2 View  Result Date: 04/05/2016 CLINICAL DATA:  Shortness of breath, history of asthma, HIV, diabetes, current smoker. EXAM: CHEST  2 VIEW COMPARISON:  Portable chest x-ray of April 04, 2016 FINDINGS: The lungs are slightly less well inflated today. The interstitial markings remain increased bilaterally but are less conspicuous overall today. There are coarse left infrahilar lung markings more conspicuous today however. There is no pleural effusion or pneumothorax. The heart is top-normal in size. The central pulmonary vascularity is prominent. There is old deformity of the posterior aspects of the fifth and sixth ribs on the right. IMPRESSION: Slight interval improvement in the appearance of the pulmonary interstitium consistent with decreased interstitial edema or pneumonia. Patchy confluent density in the left infrahilar region is compatible with pneumonia or subsegmental atelectasis. Electronically Signed   By: David  Martinique M.D.   On: 04/05/2016 08:31   Dg Chest Port 1 View  Result Date: 04/04/2016 CLINICAL DATA:  Cough, shortness of breath for about 3 days, fever, HIV EXAM: PORTABLE CHEST 1 VIEW  COMPARISON:  11/16/2014 FINDINGS: Cardiomediastinal silhouette is stable. There is reticular diffuse bilateral interstitial prominence. Atypical pneumonitis or mild interstitial edema cannot be excluded. Stable bilateral old rib fractures. No segmental infiltrate IMPRESSION: Reticular diffuse mild interstitial prominence bilaterally suspicious for atypical pneumonitis or interstitial edema. No segmental infiltrate. Old bilateral rib fractures are again noted. Electronically Signed   By: Lahoma Crocker M.D.   On: 04/04/2016 13:34    Scheduled Meds: . ARIPiprazole  10 mg Oral Daily  . aspirin  325 mg Oral Daily  . atenolol  25 mg Oral Daily  . azithromycin  500 mg Intravenous Q24H  . budesonide (PULMICORT) nebulizer solution  0.25 mg Nebulization BID  . cefTRIAXone (ROCEPHIN)  IV  1 g Intravenous Q24H  . cholecalciferol  5,000 Units Oral Daily  . elvitegravir-cobicistat-emtricitabine-tenofovir  1 tablet Oral Q breakfast  . heparin  5,000 Units Subcutaneous Q8H  . insulin aspart  0-20 Units Subcutaneous TID WC  . insulin aspart  0-5 Units Subcutaneous QHS  . insulin aspart  12 Units Subcutaneous TID WC  . insulin glargine  30 Units Subcutaneous BID  . ipratropium-albuterol  3 mL Nebulization TID  . oseltamivir  75 mg Oral BID  . pantoprazole  40 mg Oral Daily  . potassium chloride  40 mEq Oral Q4H  . rosuvastatin  20 mg Oral Daily  . vitamin E  1,000 Units Oral Daily   Continuous Infusions:   Time spent: 30 minutes   Barton Dubois  [facility] Pager [phone]. If 7PM-7AM, please contact night-coverage at [email or web address], password [facility] 04/06/2016, 10:58 AM  LOS: 2 days

## 2016-04-07 LAB — BASIC METABOLIC PANEL
ANION GAP: 13 (ref 5–15)
BUN: 19 mg/dL (ref 6–20)
CALCIUM: 8.9 mg/dL (ref 8.9–10.3)
CO2: 25 mmol/L (ref 22–32)
Chloride: 98 mmol/L — ABNORMAL LOW (ref 101–111)
Creatinine, Ser: 1.22 mg/dL — ABNORMAL HIGH (ref 0.44–1.00)
GFR calc Af Amer: 58 mL/min — ABNORMAL LOW (ref >60)
GFR, EST NON AFRICAN AMERICAN: 50 mL/min — AB (ref >60)
GLUCOSE: 211 mg/dL — AB (ref 65–99)
POTASSIUM: 3.4 mmol/L — AB (ref 3.5–5.1)
Sodium: 136 mmol/L (ref 135–145)

## 2016-04-07 LAB — GLUCOSE, CAPILLARY
Glucose-Capillary: 189 mg/dL — ABNORMAL HIGH (ref 65–99)
Glucose-Capillary: 110 mg/dL — ABNORMAL HIGH (ref 65–99)
Glucose-Capillary: 106 mg/dL — ABNORMAL HIGH (ref 65–99)

## 2016-04-07 MED ORDER — GUAIFENESIN-DM 100-10 MG/5ML PO SYRP: 5.0000 mL | ORAL_SOLUTION | ORAL | Status: DC | PRN

## 2016-04-07 MED ORDER — POTASSIUM CHLORIDE CRYS ER 20 MEQ PO TBCR
40.0000 meq | EXTENDED_RELEASE_TABLET | ORAL | Status: AC
  Administered 2016-04-07 (×2): 40 meq via ORAL
  Filled 2016-04-07 (×2): qty 2

## 2016-04-07 MED ORDER — FUROSEMIDE 20 MG PO TABS
30.0000 mg | ORAL_TABLET | Freq: Every day | ORAL | Status: DC
  Administered 2016-04-08 – 2016-04-09 (×2): 30 mg via ORAL
  Filled 2016-04-07 (×2): qty 2

## 2016-04-07 MED ORDER — IPRATROPIUM-ALBUTEROL 0.5-2.5 (3) MG/3ML IN SOLN
3.0000 mL | Freq: Two times a day (BID) | RESPIRATORY_TRACT | Status: DC
  Administered 2016-04-07 – 2016-04-09 (×4): 3 mL via RESPIRATORY_TRACT
  Filled 2016-04-07 (×4): qty 3

## 2016-04-07 NOTE — Progress Notes (Signed)
[facility] PROGRESS NOTE  [name] WYO:[id number] DOB: 1965-11-13 DOA: 04/04/2016 PCP: Michel Harrow, PA-C  Interim summary and HPI 51 y/o woman with PMH significant for HIV, HTN, HLD, uncontrolled Type 2 diabetes, obesity, COPD and tobacco abuse; who presented to ED complaining of fever/chills, generalized body aches, SOB and productive cough. Found to have DKA, CAP and positive influenza. Patient met sepsis criteria on admission.  Assessment/Plan: 1-Sepsis ([place]): due to PNA and influenza infection -will remains in telemetry  -following rec's from PCCM will continue trial CPAP QHSl -continue weaning O2 supplementation and weaned as tolerated -will continue current IV antibiotics and follow ID rec's -will follow blood cx's, sputum cx's, urine culture and will check for strep/legionella antigen -will continue PRN nebulizer -will follow clinical response -continue supportive care   2-Human immunodeficiency virus (HIV) disease ([place]) -continue home antiretroviral therapy -CD4 count in the 80 range; previously 870 -ID formally consulted; recommended to check viral load and for now to stick to zithromax and ceftriaxone for now -per ID CD fount down to due to acute infection from PNA and Flu.  3-Diabetes mellitus type 2 with complications, uncontrolled ([place]): chronically on insulin therapy -DKA on admission most likely triggered by acute CAP/influenza infection -will follow BMET and CBG's, currently 180's to low 200's. -further adjustment on hypoglycemic regimen as needed  -CBG's rising after diet advanced. -will continue SSI, continue lantus and add meal coverage. -continue modified carb diet -recent A1C 10.3  4-Essential hypertension -will resume lasix at lower dose -continue atenolol  5-Diabetic neuropathy ([name]) and chronic pain: -chronically on Norco and zanaflex; will continue home pain meds -continue control of her diabetes  6-depression: -will  continue abilify  -no SI or hallucinations  7-COPD -continue spiriva and pulmicort -PRN Duoneb ordered -very little wheezing on exam -will need formal PFT's and further outpatient/work up and evaluation   8-obesity -low calorie diet and exercise discussed with patient -BMI around 35 -with concerns for OSA -will continue trial with CPAP as recommended by pulmonary service  9-chronic diarrhea -will continue lomotil as needed   10-HLD -continue Crestor  11-CKD stage 3-4: due to diabetes -stable and at baseline -will monitor Cr trend    Code Status: Full Family Communication: No family at bedside  Disposition Plan: will transfer to telemetry bed; continue adjusting hypoglycemic regimen, continue IV antibiotics, follow cultures; continue Tamiflu and start CPAP trial QHS.    Consultants:  ID curbside (Dr. Johnnye Sima); now Dr. Megan Salon formally consulted given drop in CD4 count  Pulmonary service   Procedures:  See below for x-ray reports   Antibiotics:  Rocephin and zithromax 04/04/16  Tamiflu 04/04/16  HPI/Subjective: Afebrile, still with some symptoms of SOB, but significantly improved. No CP, still wearing NS oxygen supplementation.  Objective: Vitals:   04/07/16 0443 04/07/16 1337  BP: (!) 146/110 122/62  Pulse: 78 63  Resp: (!) 28 16  Temp: 97.9 F (36.6 C) 97.6 F (36.4 C)    Intake/Output Summary (Last 24 hours) at 04/07/16 1536 Last data filed at 04/06/16 1629  Gross per 24 hour  Intake              250 ml  Output                0 ml  Net              250 ml   Filed Weights   04/06/16 1500  Weight: 112.5 kg (248 lb 0.3 oz)    Exam:  General:  Afebrile today; feeling much better; with improvement on generalized body aches and malaise. Also with improvement in her resp status. Using [place] 3L now and breathing less SOB.   Cardiovascular: S1 and S2, no rubs, no gallops  Respiratory: improved air movement, no wheezing, scattered rhonchi.  patient breathing easier, no using accessory muscles.  Abdomen: obee, soft, NT, ND, positive BS  Musculoskeletal: Left BKA, no cyanosis or clubbing   Data Reviewed: Basic Metabolic Panel:  Recent Labs Lab 04/04/16 1352 04/04/16 1355 04/04/16 2211 04/05/16 0334 04/05/16 1445 04/06/16 0339 04/07/16 0509  NA 131* 131*  --  134*  --  138 136  K 4.6 5.0  --  3.5  --  3.0* 3.4*  CL 96* 98*  --  104  --  104 98*  CO2 19*  --   --  22  --  24 25  GLUCOSE 423* 423*  --  151*  --  205* 211*  BUN 25* 34*  --  13  --  18 19  CREATININE 1.44* 1.10* 0.95 0.91  --  1.06* 1.22*  CALCIUM 9.5  --   --  8.6*  --  8.4* 8.9  MG  --   --   --   --  1.6* 1.5*  --    Liver Function Tests:  Recent Labs Lab 04/04/16 1352  AST 27  ALT 29  ALKPHOS 76  BILITOT 1.1  PROT 8.2*  ALBUMIN 3.3*   CBC:  Recent Labs Lab 04/04/16 1352 04/04/16 1355 04/04/16 2211 04/05/16 0346 04/06/16 0339  WBC 10.1  --  10.3 10.8* 13.1*  NEUTROABS 8.4*  --   --   --   --   HGB 14.5 16.0* 12.2 12.8 12.5  HCT 42.3 47.0* 35.6* 37.0 36.5  MCV 89.8  --  90.1 89.8 91.0  PLT 233  --  205 201 228   CBG:  Recent Labs Lab 04/06/16 0808 04/06/16 1219 04/06/16 1637 04/06/16 2000 04/07/16 0731  GLUCAP 201* 119* 111* 167* 189*    Recent Results (from the past 240 hour(s))  Blood Culture (routine x 2)     Status: None (Preliminary result)   Collection Time: 04/04/16  1:50 PM  Result Value Ref Range Status   Specimen Description BLOOD LEFT ANTECUBITAL  Final   Special Requests   Final    BOTTLES DRAWN AEROBIC AND ANAEROBIC Blood Culture adequate volume   Culture   Final    NO GROWTH 3 DAYS Performed at [facility], [address], [place], [place] [address]    Report Status PENDING  Incomplete  Urine culture     Status: Abnormal   Collection Time: 04/04/16  3:09 PM  Result Value Ref Range Status   Specimen Description URINE, RANDOM  Final   Special Requests NONE  Final   Culture MULTIPLE  SPECIES PRESENT, SUGGEST RECOLLECTION (A)  Final   Report Status 04/05/2016 FINAL  Final  Blood Culture (routine x 2)     Status: None (Preliminary result)   Collection Time: 04/04/16 10:11 PM  Result Value Ref Range Status   Specimen Description BLOOD RIGHT HAND  Final   Special Requests   Final    BOTTLES DRAWN AEROBIC ONLY Blood Culture adequate volume   Culture   Final    NO GROWTH 2 DAYS Performed at [facility], [place] [address], [place], [place] [address]    Report Status PENDING  Incomplete  MRSA PCR Screening  Status: None   Collection Time: 04/04/16 10:47 PM  Result Value Ref Range Status   MRSA by PCR NEGATIVE NEGATIVE Final    Comment:        The GeneXpert MRSA Assay (FDA approved for NASAL specimens only), is one component of a comprehensive MRSA colonization surveillance program. It is not intended to diagnose MRSA infection nor to guide or monitor treatment for MRSA infections.      Studies: No results found.  Scheduled Meds: . ARIPiprazole  10 mg Oral Daily  . aspirin  325 mg Oral Daily  . atenolol  25 mg Oral Daily  . azithromycin  500 mg Intravenous Q24H  . budesonide (PULMICORT) nebulizer solution  0.5 mg Nebulization BID  . cefTRIAXone (ROCEPHIN)  IV  1 g Intravenous Q24H  . cholecalciferol  5,000 Units Oral Daily  . dextromethorphan  30 mg Oral BID  . elvitegravir-cobicistat-emtricitabine-tenofovir  1 tablet Oral Q breakfast  . heparin  5,000 Units Subcutaneous Q8H  . insulin aspart  0-20 Units Subcutaneous TID WC  . insulin aspart  0-5 Units Subcutaneous QHS  . insulin aspart  12 Units Subcutaneous TID WC  . insulin glargine  30 Units Subcutaneous BID  . ipratropium-albuterol  3 mL Nebulization BID  . oseltamivir  75 mg Oral BID  . pantoprazole  40 mg Oral Daily  . rosuvastatin  20 mg Oral Daily  . vitamin E  1,000 Units Oral Daily   Continuous Infusions:   Time spent: 30 minutes   Barton Dubois  [facility] Pager  [phone]. If 7PM-7AM, please contact night-coverage at [email or web address], password [facility] 04/07/2016, 3:36 PM  LOS: 3 days

## 2016-04-07 NOTE — Progress Notes (Signed)
Pt refused CPAP qhs.  Patient is on 4L [place] and in no distress at this time. Patient will call if she changes her mind.

## 2016-04-08 LAB — CBC
HEMATOCRIT: 35.2 % — AB (ref 36.0–46.0)
HEMOGLOBIN: 12.2 g/dL (ref 12.0–15.0)
MCH: 30.6 pg (ref 26.0–34.0)
MCHC: 34.7 g/dL (ref 30.0–36.0)
MCV: 88.2 fL (ref 78.0–100.0)
Platelets: 320 10*3/uL (ref 150–400)
RBC: 3.99 MIL/uL (ref 3.87–5.11)
RDW: 15.7 % — ABNORMAL HIGH (ref 11.5–15.5)
WBC: 9.5 10*3/uL (ref 4.0–10.5)

## 2016-04-08 LAB — BASIC METABOLIC PANEL
ANION GAP: 9 (ref 5–15)
BUN: 19 mg/dL (ref 6–20)
CHLORIDE: 99 mmol/L — AB (ref 101–111)
CO2: 26 mmol/L (ref 22–32)
Calcium: 8.6 mg/dL — ABNORMAL LOW (ref 8.9–10.3)
Creatinine, Ser: 1.16 mg/dL — ABNORMAL HIGH (ref 0.44–1.00)
GFR calc Af Amer: >60 mL/min (ref >60)
GFR calc non Af Amer: 53 mL/min — ABNORMAL LOW (ref >60)
GLUCOSE: 298 mg/dL — AB (ref 65–99)
POTASSIUM: 3.5 mmol/L (ref 3.5–5.1)
Sodium: 134 mmol/L — ABNORMAL LOW (ref 135–145)

## 2016-04-08 LAB — GLUCOSE, CAPILLARY
GLUCOSE-CAPILLARY: 47 mg/dL — AB (ref 65–99)
GLUCOSE-CAPILLARY: 193 mg/dL — AB (ref 65–99)
GLUCOSE-CAPILLARY: 104 mg/dL — AB (ref 65–99)
GLUCOSE-CAPILLARY: 128 mg/dL — AB (ref 65–99)
Glucose-Capillary: 80 mg/dL (ref 65–99)
Glucose-Capillary: 135 mg/dL — ABNORMAL HIGH (ref 65–99)
Glucose-Capillary: 267 mg/dL — ABNORMAL HIGH (ref 65–99)

## 2016-04-08 MED ORDER — INSULIN GLARGINE 100 UNIT/ML ~~LOC~~ SOLN
34.0000 [IU] | Freq: Two times a day (BID) | SUBCUTANEOUS | Status: DC
  Administered 2016-04-08 – 2016-04-09 (×2): 34 [IU] via SUBCUTANEOUS
  Filled 2016-04-08 (×3): qty 0.34

## 2016-04-08 NOTE — Progress Notes (Signed)
Patient refusing CPAP for the night. RT will continue to monitor.

## 2016-04-08 NOTE — Progress Notes (Signed)
[facility] PROGRESS NOTE  [name] ZOX:[id number] DOB: Dec 18, 1965 DOA: 04/04/2016 PCP: Michel Harrow, PA-C  Interim summary and HPI 51 y/o woman with PMH significant for HIV, HTN, HLD, uncontrolled Type 2 diabetes, obesity, COPD and tobacco abuse; who presented to ED complaining of fever/chills, generalized body aches, SOB and productive cough. Found to have DKA, CAP and positive influenza. Patient met sepsis criteria on admission.  Assessment/Plan: 1-Sepsis ([place]): due to PNA and influenza infection -will remains in telemetry  -following rec's from PCCM will continue trial CPAP QHSl -continue weaning O2 supplementation and weaned as tolerated -will check O2 sat on ambulation -will continue current IV antibiotics and follow ID rec's -will follow blood cx's, sputum cx's, urine culture and will check for strep/legionella antigen -will continue PRN nebulizer -will follow clinical response -continue supportive care   2-Human immunodeficiency virus (HIV) disease ([place]) -continue home antiretroviral therapy -CD4 count in the 80 range; previously 870 -ID formally consulted; recommended to check viral load and for now to stick to zithromax and ceftriaxone for now -per ID CD count down to due to acute infection from PNA and Flu.  3-Diabetes mellitus type 2 with complications, uncontrolled ([place]): chronically on insulin therapy -DKA on admission most likely triggered by acute CAP/influenza infection -will follow BMET and CBG's, currently 180's to low 200's. -further adjustment on hypoglycemic regimen as needed  -CBG's rising after diet advanced. -will continue SSI, continue lantus and add meal coverage. -continue modified carb diet -recent A1C 10.3  4-Essential hypertension -will resume lasix at lower dose -continue atenolol  5-Diabetic neuropathy ([name]) and chronic pain: -chronically on Norco and zanaflex; will continue home pain meds -continue control of her  diabetes  6-depression: -will continue abilify  -no SI or hallucinations  7-COPD -continue spiriva and pulmicort -PRN Duoneb ordered -very little wheezing on exam -will need formal PFT's and further outpatient/work up and evaluation   8-obesity -low calorie diet and exercise discussed with patient -BMI around 35 -with concerns for OSA -will continue trial with CPAP as recommended by pulmonary service  9-chronic diarrhea -will continue lomotil as needed   10-HLD -continue Crestor  11-CKD stage 3-4: due to diabetes -stable and at baseline -will monitor Cr trend; 1.16 currently   Code Status: Full Family Communication: No family at bedside  Disposition Plan: will transfer to telemetry bed; continue adjusting hypoglycemic regimen, continue IV antibiotics, follow cultures; continue Tamiflu and encourage to start CPAP trial QHS.    Consultants:  ID curbside (Dr. Johnnye Sima); now Dr. Megan Salon formally consulted given drop in CD4 count  Pulmonary service   Procedures:  See below for x-ray reports   Antibiotics:  Rocephin and zithromax 04/04/16  Tamiflu 04/04/16  HPI/Subjective: Afebrile, still with some symptoms of SOB, but improved overall. No CP, still wearing [place] oxygen supplementation. Per RT refusing CPAP at nighttime as instructed.  Objective: Vitals:   04/08/16 0658 04/08/16 1345  BP: 97/66 137/86  Pulse: 82 76  Resp: 16 16  Temp: 97.5 F (36.4 C) 98.3 F (36.8 C)    Intake/Output Summary (Last 24 hours) at 04/08/16 1807 Last data filed at 04/08/16 1806  Gross per 24 hour  Intake              470 ml  Output                0 ml  Net              470 ml   [employer]  04/06/16 1500  Weight: 112.5 kg (248 lb 0.3 oz)    Exam:   General:  Afebrile today; feeling ok. Reports that even improved overall; was still having generalized pain and SOB. Refusing CPAP at night time.   Cardiovascular: S1 and S2, no rubs, no gallops  Respiratory:  improved air movement, no wheezing currently, scattered rhonchi. patient breathing easier overall and not using accessory muscles.  Abdomen: obee, soft, NT, ND, positive BS  Musculoskeletal: Left BKA, no cyanosis or clubbing   Data Reviewed: Basic Metabolic Panel:  Recent Labs Lab 04/04/16 1352 04/04/16 1355 04/04/16 2211 04/05/16 0334 04/05/16 1445 04/06/16 0339 04/07/16 0509 04/08/16 0607  NA 131* 131*  --  134*  --  138 136 134*  K 4.6 5.0  --  3.5  --  3.0* 3.4* 3.5  CL 96* 98*  --  104  --  104 98* 99*  CO2 19*  --   --  22  --  24 25 26   GLUCOSE 423* 423*  --  151*  --  205* 211* 298*  BUN 25* 34*  --  13  --  18 19 19   CREATININE 1.44* 1.10* 0.95 0.91  --  1.06* 1.22* 1.16*  CALCIUM 9.5  --   --  8.6*  --  8.4* 8.9 8.6*  MG  --   --   --   --  1.6* 1.5*  --   --    Liver Function Tests:  Recent Labs Lab 04/04/16 1352  AST 27  ALT 29  ALKPHOS 76  BILITOT 1.1  PROT 8.2*  ALBUMIN 3.3*   CBC:  Recent Labs Lab 04/04/16 1352 04/04/16 1355 04/04/16 2211 04/05/16 0346 04/06/16 0339 04/08/16 0607  WBC 10.1  --  10.3 10.8* 13.1* 9.5  NEUTROABS 8.4*  --   --   --   --   --   HGB 14.5 16.0* 12.2 12.8 12.5 12.2  HCT 42.3 47.0* 35.6* 37.0 36.5 35.2*  MCV 89.8  --  90.1 89.8 91.0 88.2  PLT 233  --  205 201 228 320   CBG:  Recent Labs Lab 04/07/16 1943 04/07/16 2031 04/08/16 0723 04/08/16 1140 04/08/16 1642  GLUCAP 80 135* 267* 193* 104*    Recent Results (from the past 240 hour(s))  Blood Culture (routine x 2)     Status: None (Preliminary result)   Collection Time: 04/04/16  1:50 PM  Result Value Ref Range Status   Specimen Description BLOOD LEFT ANTECUBITAL  Final   Special Requests   Final    BOTTLES DRAWN AEROBIC AND ANAEROBIC Blood Culture adequate volume   Culture   Final    NO GROWTH 4 DAYS Performed at [facility], [address], [place], [place] [address]    Report Status PENDING  Incomplete  Urine culture     Status: Abnormal    Collection Time: 04/04/16  3:09 PM  Result Value Ref Range Status   Specimen Description URINE, RANDOM  Final   Special Requests NONE  Final   Culture MULTIPLE SPECIES PRESENT, SUGGEST RECOLLECTION (A)  Final   Report Status 04/05/2016 FINAL  Final  Blood Culture (routine x 2)     Status: None (Preliminary result)   Collection Time: 04/04/16 10:11 PM  Result Value Ref Range Status   Specimen Description BLOOD RIGHT HAND  Final   Special Requests   Final    BOTTLES DRAWN AEROBIC ONLY Blood Culture adequate volume   Culture  Final    NO GROWTH 3 DAYS Performed at [facility], [place] [address] [place], [place] [address]    Report Status PENDING  Incomplete  MRSA PCR Screening     Status: None   Collection Time: 04/04/16 10:47 PM  Result Value Ref Range Status   MRSA by PCR NEGATIVE NEGATIVE Final    Comment:        The GeneXpert MRSA Assay (FDA approved for NASAL specimens only), is one component of a comprehensive MRSA colonization surveillance program. It is not intended to diagnose MRSA infection nor to guide or monitor treatment for MRSA infections.      Studies: No results found.  Scheduled Meds: . ARIPiprazole  10 mg Oral Daily  . aspirin  325 mg Oral Daily  . atenolol  25 mg Oral Daily  . azithromycin  500 mg Intravenous Q24H  . budesonide (PULMICORT) nebulizer solution  0.5 mg Nebulization BID  . cefTRIAXone (ROCEPHIN)  IV  1 g Intravenous Q24H  . cholecalciferol  5,000 Units Oral Daily  . dextromethorphan  30 mg Oral BID  . elvitegravir-cobicistat-emtricitabine-tenofovir  1 tablet Oral Q breakfast  . furosemide  30 mg Oral Daily  . heparin  5,000 Units Subcutaneous Q8H  . insulin aspart  0-20 Units Subcutaneous TID WC  . insulin aspart  0-5 Units Subcutaneous QHS  . insulin aspart  12 Units Subcutaneous TID WC  . insulin glargine  30 Units Subcutaneous BID  . ipratropium-albuterol  3 mL Nebulization BID  . oseltamivir  75 mg Oral BID  .  pantoprazole  40 mg Oral Daily  . rosuvastatin  20 mg Oral Daily  . vitamin E  1,000 Units Oral Daily   Continuous Infusions:   Time spent: 25 minutes   Barton Dubois  [facility] Pager [phone]. If 7PM-7AM, please contact night-coverage at [email or web address], password [facility] 04/08/2016, 6:07 PM  LOS: 4 days

## 2016-04-09 DIAGNOSIS — Z6841 Body Mass Index (BMI) 40.0 and over, adult: Secondary | ICD-10-CM

## 2016-04-09 DIAGNOSIS — E1110 Type 2 diabetes mellitus with ketoacidosis without coma: Secondary | ICD-10-CM

## 2016-04-09 DIAGNOSIS — E785 Hyperlipidemia, unspecified: Secondary | ICD-10-CM

## 2016-04-09 DIAGNOSIS — R0602 Shortness of breath: Secondary | ICD-10-CM

## 2016-04-09 DIAGNOSIS — E6609 Other obesity due to excess calories: Secondary | ICD-10-CM

## 2016-04-09 DIAGNOSIS — IMO0001 Reserved for inherently not codable concepts without codable children: Secondary | ICD-10-CM

## 2016-04-09 LAB — GLUCOSE, CAPILLARY
GLUCOSE-CAPILLARY: 202 mg/dL — AB (ref 65–99)
Glucose-Capillary: 159 mg/dL — ABNORMAL HIGH (ref 65–99)
Glucose-Capillary: 115 mg/dL — ABNORMAL HIGH (ref 65–99)

## 2016-04-09 MED ORDER — DEXTROMETHORPHAN POLISTIREX ER 30 MG/5ML PO SUER: 30.0000 mg | Freq: Two times a day (BID) | ORAL | 0 refills | Status: DC | PRN

## 2016-04-09 NOTE — Progress Notes (Signed)
Completed D/C teaching. Discussed medications. Patient will be D/C home in stable condition. Patient waiting for ride.

## 2016-04-09 NOTE — Discharge Summary (Signed)
Physician Discharge Summary  [name]:[id number] DOB: Apr 06, 1965 DOA: 04/04/2016  PCP: Michel Harrow, PA-C  Admit date: 04/04/2016 Discharge date: 04/09/2016  Time spent: 35 minutes  Recommendations for Outpatient Follow-up:  1. Repeat BMET to follow electrolytes and renal function  2. Repeat CXR in 3 weeks or so to assure resolution of infiltrates 3. Assist with smoking cessation and weight loss. 4. Please arrange outpatient follow up with pulmonary service for PFT's and sleep study (once acute PNA process completely resolved)   Discharge Diagnoses:  Principal Problem:   Influenza B Active Problems:   Human immunodeficiency virus (HIV) disease ([place])   Diabetes mellitus type 2 with complications, uncontrolled ([place])   Essential hypertension   CAP (community acquired pneumonia)   Diabetic neuropathy ([place])   Sepsis ([place])   DKA, type 2 ([place])   Obesity hypoventilation syndrome ([place])   Diabetic ketoacidosis without coma associated with type 2 diabetes mellitus ([place])   SOB (shortness of breath)   Hyperlipidemia   Class 3 obesity due to excess calories with serious comorbidity and body mass index (BMI) of 40.0 to 44.9 in adult [facility])   Discharge Condition: stable and improved. No CP, no requiring oxygen supplementation and afebrile. Patient discharge home and with instructions to follow up with PCP and with ID service as an outpatient.  Diet recommendation: modified carbohydrates, heart healthy and low calorie diet   Filed Weights   04/06/16 1500  Weight: 112.5 kg (248 lb 0.3 oz)    History of present illness:   51 y/o woman with PMH significant for HIV, HTN, HLD, uncontrolled Type 2 diabetes, obesity, COPD and tobacco abuse; who presented to ED complaining of fever/chills, generalized body aches, SOB and productive cough. Patient symptoms has been present for the last 2 days or so and worsening. Patient denies hemoptysis, abd pain, dysuria, hematuria, melena,  hematochezia, hematemesis, focal weakness or any other complaints. Reports not been compliant with her insulin therapy in the last 2 days or so due to not feeling good and not eating well, with concerns to get low.  ED Course: patient CXR demonstrated PNA; work up also positive for mild DKA and patient meeting sepsis criteria. Cultures taken; IVF's resuscitation initiated; IV antibiotics started and patient initiated on glucostabilizer. Positive influenza PCR  Hospital Course:  1-Sepsis [facility]): due to PNA and influenza infection -sepsis features resolved -patient afebrile, not having CP or SOB currently. Good O2 sat on RA. -patient's completed IV antibiotic therapy for 5 days (using rocephin and Zithromax for her PNA and also 5 days of Tamiflu for influenza) -following rec's from ID service no further antibiotics needed at this point. -by time of discharge patient blood cx's, sputum cx's and urine culture remains neg -also neg results for strep/legionella antigen in urine.  2-Human immunodeficiency virus (HIV) disease ([place]) -continue home antiretroviral therapy -CD4 count in the 80 range; previously 870 -ID formally consulted; in heir opinion CD count down to due to acute infection from PNA and Flu. -she will be follow at ID office were repeat CD4 count and viral load will be done. Further medications adjustment to be decided base on results.  3-Diabetes mellitus type 2 with complications, uncontrolled ([place]): chronically on insulin therapy -DKA on admission most likely triggered by acute CAP/influenza infection -will discharge home with resumption on hypoglycemic regimen as previously instructed by her endocrinologist -encourage to follow modified carb diet -recent A1C 10.3  4-Essential hypertension -will resume lasix and continue atenolol -advise to maintain adequate hydration  and to follow heart healthy diet   5-Diabetic neuropathy ([name]) and chronic pain: -chronically on Norco and  zanaflex; will continue home pain meds -continue control of her diabetes  6-Depression: -will continue abilify  -no SI or hallucinations  7-COPD -continue spiriva and Symbicort -very little wheezing on exam -will need formal PFT's and further outpatient work up and evaluation   8-obesity -low calorie diet and exercise discussed with patient -BMI around 35 -with concerns for OSA; will recommend sleep study as an outpatient  9-chronic diarrhea -will continue lomotil as needed   10-HLD -will continue Crestor  11-CKD stage 3-4: due to diabetes -stable and at baseline -will recommend BMET at follow up to monitor Cr trend; Cr 1.16 at discharge   Procedures: See below for x-ray reports   Consultations:  ID  Pulmonary service   Discharge Exam: Vitals:   04/08/16 2116 04/09/16 0508  BP: (!) 92/53 139/63  Pulse: 86 86  Resp: 18 16  Temp: 99.4 F (37.4 C) 98 F (36.7 C)     General:  Afebrile today; feeling ok. Reports no CP and is not requiring O2 supplementation. Patient refused CPAP trial at night time.   Cardiovascular: S1 and S2, no rubs, no gallops  Respiratory: improved air movement, no wheezing currently, scattered rhonchi. patient breathing a lot easier overall and not using accessory muscles.  Abdomen: obese, soft, NT, ND, positive BS  Musculoskeletal: Left BKA, no cyanosis or clubbing    Discharge Instructions   Discharge Instructions    Diet - low sodium heart healthy    Complete by:  As directed    Diet Carb Modified    Complete by:  As directed    Discharge instructions    Complete by:  As directed    Keep yourself well hydrated Take medications as prescribed Arrange follow up with PCP in 1 week Follow up with ID service (office will arrange follow up appointment) Follow low carbohydrates diet Stop smoking and avoid second hand smoking     Current Discharge Medication List    START taking these medications   Details   dextromethorphan (DELSYM) 30 MG/5ML liquid Take 5 mLs (30 mg total) by mouth every 12 (twelve) hours as needed for cough. Qty: 89 mL, Refills: 0      CONTINUE these medications which have NOT CHANGED   Details  ARIPiprazole (ABILIFY) 10 MG tablet Take 10 mg by mouth daily. Refills: 0    aspirin EC 325 MG EC tablet Take 1 tablet (325 mg total) by mouth daily. Qty: 30 tablet, Refills: 0    atenolol (TENORMIN) 25 MG tablet Take 25 mg by mouth daily.     budesonide-formoterol (SYMBICORT) 80-4.5 MCG/ACT inhaler Inhale 2 puffs into the lungs 2 (two) times daily.    Cholecalciferol (VITAMIN D3) 5000 UNITS TABS Take 5,000 Units by mouth daily.     clonazePAM (KLONOPIN) 0.5 MG tablet Take 0.5 tablets (0.25 mg total) by mouth 3 (three) times daily as needed (anxiety). Qty: 30 tablet, Refills: 0    cyclobenzaprine (FLEXERIL) 5 MG tablet Take 2 tablets (10 mg total) by mouth 3 (three) times daily as needed for muscle spasms. Qty: 15 tablet, Refills: 0    furosemide (LASIX) 80 MG tablet TK 1 T PO QD Refills: 0    GENVOYA 150-150-200-10 MG TABS tablet TAKE 1 TABLET BY MOUTH EVERY DAY Qty: 30 tablet, Refills: 5    HUMULIN R U-500 KWIKPEN 500 UNIT/ML kwikpen ADMINISTER DOSES 30 MINUTES BEFORE EACH MEAL.  USE 60 UNITS BEFORE BREAKFAST, 50 UNITS BEFORE LUNCH AND 35 UNITS BEFORE DINNER Qty: 6 mL, Refills: 0    HYDROcodone-acetaminophen (NORCO) 10-325 MG tablet Take 1 tablet by mouth every 6 (six) hours as needed for moderate pain or severe pain. Qty: 30 tablet, Refills: 0   Associated Diagnoses: Hereditary and idiopathic peripheral neuropathy    metFORMIN (GLUCOPHAGE-XR) 750 MG 24 hr tablet Take 1 tablet (750 mg total) by mouth daily with breakfast. Qty: 60 tablet, Refills: 3    pantoprazole (PROTONIX) 40 MG tablet Take 40 mg by mouth daily. Refills: 0    rosuvastatin (CRESTOR) 20 MG tablet Take 20 mg by mouth daily. Reported on 05/02/2015 Refills: 0    tiZANidine (ZANAFLEX) 4 MG tablet TK  1 T PO Q 8 H Refills: 0    TOUJEO SOLOSTAR 300 UNIT/ML SOPN INJECT 70 UNITS UNDER THE SKIN TWICE DAILY Qty: 9 pen, Refills: 1    VICTOZA 18 MG/3ML SOPN ADMINISTER 1.8 MG UNDER THE SKIN DAILY Qty: 6 mL, Refills: 1    vitamin E (VITAMIN E) 1000 UNIT capsule Take 1,000 Units by mouth daily.     diphenoxylate-atropine (LOMOTIL) 2.5-0.025 MG tablet Take 1 tablet by mouth 4 (four) times daily as needed for diarrhea or loose stools. Qty: 60 tablet, Refills: 3   Associated Diagnoses: Human immunodeficiency virus (HIV) disease ([name])    eszopiclone (LUNESTA) 2 MG TABS tablet Take 3 mg by mouth at bedtime as needed for sleep. Take immediately before bedtime    fenofibrate (TRICOR) 145 MG tablet Take 1 tablet (145 mg total) by mouth daily. Qty: 90 tablet, Refills: 3   Associated Diagnoses: Other and unspecified hyperlipidemia; Encounter for long-term (current) use of other medications; Need for prophylactic vaccination and inoculation against influenza; Hip fracture, left, sequela; Type II or unspecified type diabetes mellitus without mention of complication, not stated as uncontrolled; Human immunodeficiency virus (HIV) disease; Depressive disorder, not elsewhere classified; Screening examination for venereal disease    glucose blood (ONETOUCH VERIO) test strip Use to test blood sugar 3 times daily Qty: 100 each, Refills: 4    Insulin Pen Needle (NOVOTWIST) 32G X 5 MM MISC Use 2 per day to inject Toujeo and Victoza Qty: 90 each, Refills: 3    NOVOLOG 100 UNIT/ML injection ADMINISTER 15 TO 20 UNITS UNDER THE SKIN BEFORE MEALS Qty: 10 mL, Refills: 0      STOP taking these medications     feeding supplement, GLUCERNA SHAKE, (GLUCERNA SHAKE) LIQD        Allergies  Allergen Reactions  . Cortizone-10 [Hydrocortisone] Rash    Per patient, given local injection at knee and developed rash at local site.    Follow-up Information    MAUNEY, JESSICA S, PA-C. Schedule an appointment as soon as  possible for a visit in 1 week(s).   Specialty:  Physician Assistant Contact information: [place] [address] [phone]        Bobby Rumpf, MD .   Specialty:  Infectious Diseases Contact information: [place] [address] [phone]            The results of significant diagnostics from this hospitalization (including imaging, microbiology, ancillary and laboratory) are listed below for reference.    Significant Diagnostic Studies: Dg Chest 2 View  Result Date: 04/05/2016 CLINICAL DATA:  Shortness of breath, history of asthma, HIV, diabetes, current smoker. EXAM: CHEST  2 VIEW COMPARISON:  Portable chest x-ray of April 04, 2016 FINDINGS: The lungs are slightly less well inflated today. The interstitial markings remain increased bilaterally but are less conspicuous overall today. There are coarse left infrahilar lung markings more conspicuous today however. There is no pleural effusion or pneumothorax. The heart is top-normal in size. The central pulmonary vascularity is prominent. There is old deformity of the posterior aspects of the fifth and sixth ribs on the right. IMPRESSION: Slight interval improvement in the appearance of the pulmonary interstitium consistent with decreased interstitial edema or pneumonia. Patchy confluent density in the left infrahilar region is compatible with pneumonia or subsegmental atelectasis. Electronically Signed   By: David  Martinique M.D.   On: 04/05/2016 08:31   Dg Chest Port 1 View  Result Date: 04/04/2016 CLINICAL DATA:  Cough, shortness of breath for about 3 days, fever, HIV EXAM: PORTABLE CHEST 1 VIEW COMPARISON:  11/16/2014 FINDINGS: Cardiomediastinal silhouette is stable. There is reticular diffuse bilateral interstitial prominence. Atypical pneumonitis or mild interstitial edema cannot be excluded. Stable bilateral old rib fractures. No segmental infiltrate IMPRESSION: Reticular diffuse mild  interstitial prominence bilaterally suspicious for atypical pneumonitis or interstitial edema. No segmental infiltrate. Old bilateral rib fractures are again noted. Electronically Signed   By: Lahoma Crocker M.D.   On: 04/04/2016 13:34    Microbiology: Recent Results (from the past 240 hour(s))  Blood Culture (routine x 2)     Status: None (Preliminary result)   Collection Time: 04/04/16  1:50 PM  Result Value Ref Range Status   Specimen Description BLOOD LEFT ANTECUBITAL  Final   Special Requests   Final    BOTTLES DRAWN AEROBIC AND ANAEROBIC Blood Culture adequate volume   Culture   Final    NO GROWTH 4 DAYS Performed at [facility], [address], [place], [place] [address]    Report Status PENDING  Incomplete  Urine culture     Status: Abnormal   Collection Time: 04/04/16  3:09 PM  Result Value Ref Range Status   Specimen Description URINE, RANDOM  Final   Special Requests NONE  Final   Culture MULTIPLE SPECIES PRESENT, SUGGEST RECOLLECTION (A)  Final   Report Status 04/05/2016 FINAL  Final  Blood Culture (routine x 2)     Status: None (Preliminary result)   Collection Time: 04/04/16 10:11 PM  Result Value Ref Range Status   Specimen Description BLOOD RIGHT HAND  Final   Special Requests   Final    BOTTLES DRAWN AEROBIC ONLY Blood Culture adequate volume   Culture   Final    NO GROWTH 3 DAYS Performed at [facility], [place] [address] [place],  [address]    Report Status PENDING  Incomplete  MRSA PCR Screening     Status: None   Collection Time: 04/04/16 10:47 PM  Result Value Ref Range Status   MRSA by PCR NEGATIVE NEGATIVE Final    Comment:        The GeneXpert MRSA Assay (FDA approved for NASAL specimens only), is one component of a comprehensive MRSA colonization surveillance program. It is not intended to diagnose MRSA infection nor to guide or monitor treatment for MRSA infections.      Labs: Basic Metabolic Panel:  Recent Labs Lab  04/04/16 1352 04/04/16 1355 04/04/16 2211 04/05/16 0334 04/05/16 1445 04/06/16 0339 04/07/16 0509 04/08/16 0607  NA 131* 131*  --  134*  --  138 136 134*  K 4.6 5.0  --  3.5  --  3.0* 3.4* 3.5  CL 96* 98*  --  104  --  104 98* 99*  CO2 19*  --   --  22  --  24 25 26   GLUCOSE 423* 423*  --  151*  --  205* 211* 298*  BUN 25* 34*  --  13  --  18 19 19   CREATININE 1.44* 1.10* 0.95 0.91  --  1.06* 1.22* 1.16*  CALCIUM 9.5  --   --  8.6*  --  8.4* 8.9 8.6*  MG  --   --   --   --  1.6* 1.5*  --   --    Liver Function Tests:  Recent Labs Lab 04/04/16 1352  AST 27  ALT 29  ALKPHOS 76  BILITOT 1.1  PROT 8.2*  ALBUMIN 3.3*   CBC:  Recent Labs Lab 04/04/16 1352 04/04/16 1355 04/04/16 2211 04/05/16 0346 04/06/16 0339 04/08/16 0607  WBC 10.1  --  10.3 10.8* 13.1* 9.5  NEUTROABS 8.4*  --   --   --   --   --   HGB 14.5 16.0* 12.2 12.8 12.5 12.2  HCT 42.3 47.0* 35.6* 37.0 36.5 35.2*  MCV 89.8  --  90.1 89.8 91.0 88.2  PLT 233  --  205 201 228 320   CBG:  Recent Labs Lab 04/08/16 1140 04/08/16 1642 04/08/16 2156 04/09/16 0739 04/09/16 1158  GLUCAP 193* 104* 128* 159* 202*     Signed:  Barton Dubois MD.  [facility] 04/09/2016, 2:48 PM

## 2016-04-09 NOTE — Progress Notes (Signed)
Pt mother called and stated that she did not have a ride to pick up patient and requested taxi service to bring patient home. Patient mother stated that she could pay for the taxi. Called [facility] and they will pick patient up.

## 2016-04-09 NOTE — Care Management Important Message (Signed)
Important Message  Patient Details IM Letter given to Cookie/Case Manager to present to Patient Name: [name] MRN: [id number] Date of Birth: [birth year]   Medicare Important Message Given:  Yes    Kerin Salen 04/09/2016, 11:28 AM

## 2016-04-09 NOTE — Progress Notes (Signed)
    [place] for Infectious Disease   Reason for visit: Follow up on pneumonia, HIV  Interval History: afebrile, WBC wnl, no complaints, no sob, no associated n/v/d  Physical Exam: Constitutional:  Vitals:   04/08/16 2116 04/09/16 0508  BP: (!) 92/53 139/63  Pulse: 86 86  Resp: 18 16  Temp: 99.4 F (37.4 C) 98 F (36.7 C)   patient appears in NAD Respiratory: Normal respiratory effort; CTA B Cardiovascular: RRR GI: soft, nt, nd  Review of Systems: Constitutional: negative for malaise and anorexia Integument/breast: negative for rash  Lab Results  Component Value Date   WBC 9.5 04/08/2016   HGB 12.2 04/08/2016   HCT 35.2 (L) 04/08/2016   MCV 88.2 04/08/2016   PLT 320 04/08/2016    Lab Results  Component Value Date   CREATININE 1.16 (H) 04/08/2016   BUN 19 04/08/2016   NA 134 (L) 04/08/2016   K 3.5 04/08/2016   CL 99 (L) 04/08/2016   CO2 26 04/08/2016    Lab Results  Component Value Date   ALT 29 04/04/2016   AST 27 04/04/2016   ALKPHOS 76 04/04/2016     Microbiology: Recent Results (from the past 240 hour(s))  Blood Culture (routine x 2)     Status: None (Preliminary result)   Collection Time: 04/04/16  1:50 PM  Result Value Ref Range Status   Specimen Description BLOOD LEFT ANTECUBITAL  Final   Special Requests   Final    BOTTLES DRAWN AEROBIC AND ANAEROBIC Blood Culture adequate volume   Culture   Final    NO GROWTH 4 DAYS Performed at [facility], [address], [place], [place] [address]    Report Status PENDING  Incomplete  Urine culture     Status: Abnormal   Collection Time: 04/04/16  3:09 PM  Result Value Ref Range Status   Specimen Description URINE, RANDOM  Final   Special Requests NONE  Final   Culture MULTIPLE SPECIES PRESENT, SUGGEST RECOLLECTION (A)  Final   Report Status 04/05/2016 FINAL  Final  Blood Culture (routine x 2)     Status: None (Preliminary result)   Collection Time: 04/04/16 10:11 PM  Result Value Ref  Range Status   Specimen Description BLOOD RIGHT HAND  Final   Special Requests   Final    BOTTLES DRAWN AEROBIC ONLY Blood Culture adequate volume   Culture   Final    NO GROWTH 3 DAYS Performed at [facility], [place] [address], [place], [place] [address]    Report Status PENDING  Incomplete  MRSA PCR Screening     Status: None   Collection Time: 04/04/16 10:47 PM  Result Value Ref Range Status   MRSA by PCR NEGATIVE NEGATIVE Final    Comment:        The GeneXpert MRSA Assay (FDA approved for NASAL specimens only), is one component of a comprehensive MRSA colonization surveillance program. It is not intended to diagnose MRSA infection nor to guide or monitor treatment for MRSA infections.     Impression/Plan:  1. Community acquired pneumonia - has received 5 days of treatment, is currently off of O2, afebrile and normal WBC No further indication for antibiotics 2. HIV - transient decrease in her CD4 in the setting of acute illness.  Will follow up with this as an outpatient. No viral load sent.  Continue Genvoya

## 2016-04-10 LAB — CULTURE, BLOOD (ROUTINE X 2)
Culture: NO GROWTH
Culture: NO GROWTH
SPECIAL REQUESTS: ADEQUATE
Special Requests: ADEQUATE

## 2016-04-16 ENCOUNTER — Other Ambulatory Visit: Payer: Commercial Managed Care - HMO

## 2016-04-17 ENCOUNTER — Other Ambulatory Visit: Payer: Commercial Managed Care - HMO

## 2016-04-18 ENCOUNTER — Other Ambulatory Visit: Payer: Self-pay

## 2016-04-18 ENCOUNTER — Telehealth: Payer: Self-pay | Admitting: Endocrinology

## 2016-04-18 MED ORDER — INSULIN REGULAR HUMAN (CONC) 500 UNIT/ML ~~LOC~~ SOPN: PEN_INJECTOR | SUBCUTANEOUS | 1 refills | Status: DC

## 2016-04-18 MED ORDER — LIRAGLUTIDE 18 MG/3ML ~~LOC~~ SOPN: PEN_INJECTOR | SUBCUTANEOUS | 1 refills | Status: DC

## 2016-04-18 NOTE — Telephone Encounter (Signed)
Ordered

## 2016-04-18 NOTE — Telephone Encounter (Signed)
Pt needs victoza and humulin rcalled to [facility] please

## 2016-04-19 ENCOUNTER — Ambulatory Visit: Payer: Commercial Managed Care - HMO | Admitting: Endocrinology

## 2016-04-25 DIAGNOSIS — F17200 Nicotine dependence, unspecified, uncomplicated: Secondary | ICD-10-CM | POA: Diagnosis not present

## 2016-04-25 DIAGNOSIS — J189 Pneumonia, unspecified organism: Secondary | ICD-10-CM | POA: Diagnosis not present

## 2016-04-25 DIAGNOSIS — R748 Abnormal levels of other serum enzymes: Secondary | ICD-10-CM | POA: Diagnosis not present

## 2016-04-25 DIAGNOSIS — R0602 Shortness of breath: Secondary | ICD-10-CM | POA: Diagnosis not present

## 2016-05-11 ENCOUNTER — Telehealth: Payer: Self-pay | Admitting: *Deleted

## 2016-05-11 NOTE — Telephone Encounter (Signed)
Patient called, asking if Dr Johnnye Sima would give her a short course of pain medication.  She has been taking  hydrocodone-acetaminophen 10-325. She ran out today, has scheduled follow up with her pain specialist on Thursday next week. They are closed today, will not refill until they see her. She is asking for Ultram to be called to [facility]. Please advise. Landis Gandy, RN

## 2016-05-12 NOTE — Telephone Encounter (Signed)
Unfortunately she will need to wait  thanks

## 2016-05-19 ENCOUNTER — Encounter (HOSPITAL_COMMUNITY): Payer: Self-pay | Admitting: Emergency Medicine

## 2016-05-19 ENCOUNTER — Inpatient Hospital Stay (HOSPITAL_COMMUNITY)
Admission: EM | Admit: 2016-05-19 | Discharge: 2016-05-25 | DRG: 974 | Disposition: A | Discharge status: Skilled Nursing Facility | Payer: Medicare HMO | Attending: Internal Medicine | Admitting: Internal Medicine

## 2016-05-19 ENCOUNTER — Emergency Department (HOSPITAL_COMMUNITY): Payer: Medicare HMO

## 2016-05-19 DIAGNOSIS — Z6835 Body mass index (BMI) 35.0-35.9, adult: Secondary | ICD-10-CM

## 2016-05-19 DIAGNOSIS — R404 Transient alteration of awareness: Secondary | ICD-10-CM | POA: Diagnosis not present

## 2016-05-19 DIAGNOSIS — Z7982 Long term (current) use of aspirin: Secondary | ICD-10-CM | POA: Diagnosis not present

## 2016-05-19 DIAGNOSIS — E1151 Type 2 diabetes mellitus with diabetic peripheral angiopathy without gangrene: Secondary | ICD-10-CM | POA: Diagnosis present

## 2016-05-19 DIAGNOSIS — G9340 Encephalopathy, unspecified: Secondary | ICD-10-CM

## 2016-05-19 DIAGNOSIS — I130 Hypertensive heart and chronic kidney disease with heart failure and stage 1 through stage 4 chronic kidney disease, or unspecified chronic kidney disease: Secondary | ICD-10-CM | POA: Diagnosis present

## 2016-05-19 DIAGNOSIS — I519 Heart disease, unspecified: Secondary | ICD-10-CM | POA: Diagnosis not present

## 2016-05-19 DIAGNOSIS — A4189 Other specified sepsis: Principal | ICD-10-CM | POA: Diagnosis present

## 2016-05-19 DIAGNOSIS — I10 Essential (primary) hypertension: Secondary | ICD-10-CM

## 2016-05-19 DIAGNOSIS — Z452 Encounter for adjustment and management of vascular access device: Secondary | ICD-10-CM

## 2016-05-19 DIAGNOSIS — E1140 Type 2 diabetes mellitus with diabetic neuropathy, unspecified: Secondary | ICD-10-CM | POA: Diagnosis present

## 2016-05-19 DIAGNOSIS — E1165 Type 2 diabetes mellitus with hyperglycemia: Secondary | ICD-10-CM | POA: Diagnosis not present

## 2016-05-19 DIAGNOSIS — B370 Candidal stomatitis: Secondary | ICD-10-CM | POA: Diagnosis not present

## 2016-05-19 DIAGNOSIS — R531 Weakness: Secondary | ICD-10-CM | POA: Diagnosis not present

## 2016-05-19 DIAGNOSIS — R7989 Other specified abnormal findings of blood chemistry: Secondary | ICD-10-CM | POA: Diagnosis not present

## 2016-05-19 DIAGNOSIS — J9601 Acute respiratory failure with hypoxia: Secondary | ICD-10-CM | POA: Diagnosis not present

## 2016-05-19 DIAGNOSIS — E118 Type 2 diabetes mellitus with unspecified complications: Secondary | ICD-10-CM

## 2016-05-19 DIAGNOSIS — R072 Precordial pain: Secondary | ICD-10-CM | POA: Diagnosis not present

## 2016-05-19 DIAGNOSIS — E1169 Type 2 diabetes mellitus with other specified complication: Secondary | ICD-10-CM | POA: Diagnosis present

## 2016-05-19 DIAGNOSIS — E1159 Type 2 diabetes mellitus with other circulatory complications: Secondary | ICD-10-CM | POA: Diagnosis present

## 2016-05-19 DIAGNOSIS — R0602 Shortness of breath: Secondary | ICD-10-CM | POA: Diagnosis not present

## 2016-05-19 DIAGNOSIS — N184 Chronic kidney disease, stage 4 (severe): Secondary | ICD-10-CM | POA: Diagnosis present

## 2016-05-19 DIAGNOSIS — E1122 Type 2 diabetes mellitus with diabetic chronic kidney disease: Secondary | ICD-10-CM | POA: Diagnosis present

## 2016-05-19 DIAGNOSIS — R0902 Hypoxemia: Secondary | ICD-10-CM | POA: Diagnosis not present

## 2016-05-19 DIAGNOSIS — E785 Hyperlipidemia, unspecified: Secondary | ICD-10-CM | POA: Diagnosis not present

## 2016-05-19 DIAGNOSIS — E876 Hypokalemia: Secondary | ICD-10-CM | POA: Diagnosis present

## 2016-05-19 DIAGNOSIS — R278 Other lack of coordination: Secondary | ICD-10-CM | POA: Diagnosis not present

## 2016-05-19 DIAGNOSIS — R748 Abnormal levels of other serum enzymes: Secondary | ICD-10-CM | POA: Diagnosis not present

## 2016-05-19 DIAGNOSIS — J45901 Unspecified asthma with (acute) exacerbation: Secondary | ICD-10-CM | POA: Diagnosis not present

## 2016-05-19 DIAGNOSIS — B20 Human immunodeficiency virus [HIV] disease: Secondary | ICD-10-CM | POA: Diagnosis not present

## 2016-05-19 DIAGNOSIS — Z789 Other specified health status: Secondary | ICD-10-CM

## 2016-05-19 DIAGNOSIS — E662 Morbid (severe) obesity with alveolar hypoventilation: Secondary | ICD-10-CM | POA: Diagnosis not present

## 2016-05-19 DIAGNOSIS — J9622 Acute and chronic respiratory failure with hypercapnia: Secondary | ICD-10-CM | POA: Diagnosis not present

## 2016-05-19 DIAGNOSIS — J9621 Acute and chronic respiratory failure with hypoxia: Secondary | ICD-10-CM | POA: Diagnosis not present

## 2016-05-19 DIAGNOSIS — I2699 Other pulmonary embolism without acute cor pulmonale: Secondary | ICD-10-CM

## 2016-05-19 DIAGNOSIS — I214 Non-ST elevation (NSTEMI) myocardial infarction: Secondary | ICD-10-CM | POA: Diagnosis present

## 2016-05-19 DIAGNOSIS — E661 Drug-induced obesity: Secondary | ICD-10-CM | POA: Diagnosis not present

## 2016-05-19 DIAGNOSIS — G9341 Metabolic encephalopathy: Secondary | ICD-10-CM | POA: Diagnosis not present

## 2016-05-19 DIAGNOSIS — I5032 Chronic diastolic (congestive) heart failure: Secondary | ICD-10-CM | POA: Diagnosis not present

## 2016-05-19 DIAGNOSIS — N183 Chronic kidney disease, stage 3 (moderate): Secondary | ICD-10-CM | POA: Diagnosis not present

## 2016-05-19 DIAGNOSIS — R2689 Other abnormalities of gait and mobility: Secondary | ICD-10-CM | POA: Diagnosis not present

## 2016-05-19 DIAGNOSIS — N179 Acute kidney failure, unspecified: Secondary | ICD-10-CM | POA: Diagnosis not present

## 2016-05-19 DIAGNOSIS — Z794 Long term (current) use of insulin: Secondary | ICD-10-CM

## 2016-05-19 DIAGNOSIS — I5189 Other ill-defined heart diseases: Secondary | ICD-10-CM | POA: Diagnosis present

## 2016-05-19 DIAGNOSIS — R4182 Altered mental status, unspecified: Secondary | ICD-10-CM | POA: Diagnosis not present

## 2016-05-19 DIAGNOSIS — J9600 Acute respiratory failure, unspecified whether with hypoxia or hypercapnia: Secondary | ICD-10-CM | POA: Diagnosis not present

## 2016-05-19 DIAGNOSIS — I152 Hypertension secondary to endocrine disorders: Secondary | ICD-10-CM | POA: Diagnosis present

## 2016-05-19 DIAGNOSIS — IMO0002 Reserved for concepts with insufficient information to code with codable children: Secondary | ICD-10-CM | POA: Diagnosis present

## 2016-05-19 DIAGNOSIS — S2243XA Multiple fractures of ribs, bilateral, initial encounter for closed fracture: Secondary | ICD-10-CM | POA: Diagnosis not present

## 2016-05-19 HISTORY — DX: Non-ST elevation (NSTEMI) myocardial infarction: I21.4

## 2016-05-19 HISTORY — DX: Acute and chronic respiratory failure with hypoxia: J96.21

## 2016-05-19 HISTORY — DX: Metabolic encephalopathy: G93.41

## 2016-05-19 LAB — CBC WITH DIFFERENTIAL/PLATELET
BASOS PCT: 0 %
Basophils Absolute: 0 10*3/uL (ref 0.0–0.1)
EOS ABS: 0 10*3/uL (ref 0.0–0.7)
EOS PCT: 0 %
HCT: 45.8 % (ref 36.0–46.0)
Hemoglobin: 13.7 g/dL (ref 12.0–15.0)
LYMPHS ABS: 0.9 10*3/uL (ref 0.7–4.0)
Lymphocytes Relative: 9 %
MCH: 27.4 pg (ref 26.0–34.0)
MCHC: 29.9 g/dL — AB (ref 30.0–36.0)
MCV: 91.6 fL (ref 78.0–100.0)
MONOS PCT: 7 %
Monocytes Absolute: 0.7 10*3/uL (ref 0.1–1.0)
Neutro Abs: 8 10*3/uL — ABNORMAL HIGH (ref 1.7–7.7)
Neutrophils Relative %: 84 %
PLATELETS: 340 10*3/uL (ref 150–400)
RBC: 5 MIL/uL (ref 3.87–5.11)
RDW: 17.7 % — ABNORMAL HIGH (ref 11.5–15.5)
WBC: 9.6 10*3/uL (ref 4.0–10.5)

## 2016-05-19 LAB — I-STAT CHEM 8, ED
BUN: 26 mg/dL — AB (ref 6–20)
CHLORIDE: 98 mmol/L — AB (ref 101–111)
Calcium, Ion: 1.1 mmol/L — ABNORMAL LOW (ref 1.15–1.40)
Creatinine, Ser: 1.9 mg/dL — ABNORMAL HIGH (ref 0.44–1.00)
Glucose, Bld: 563 mg/dL (ref 65–99)
HEMATOCRIT: 48 % — AB (ref 36.0–46.0)
Hemoglobin: 16.3 g/dL — ABNORMAL HIGH (ref 12.0–15.0)
Potassium: 4.5 mmol/L (ref 3.5–5.1)
Sodium: 138 mmol/L (ref 135–145)
TCO2: 28 mmol/L (ref 0–100)

## 2016-05-19 LAB — URINALYSIS, ROUTINE W REFLEX MICROSCOPIC
BILIRUBIN URINE: NEGATIVE
Glucose, UA: >=500 mg/dL — AB
KETONES UR: 5 mg/dL — AB
LEUKOCYTES UA: NEGATIVE
Nitrite: NEGATIVE
PROTEIN: 30 mg/dL — AB
Specific Gravity, Urine: 1.022 (ref 1.005–1.030)
pH: 5 (ref 5.0–8.0)

## 2016-05-19 LAB — I-STAT ARTERIAL BLOOD GAS, ED
ACID-BASE DEFICIT: 1 mmol/L (ref 0.0–2.0)
Bicarbonate: 27.4 mmol/L (ref 20.0–28.0)
O2 SAT: 99 %
PH ART: 7.246 — AB (ref 7.350–7.450)
TCO2: 29 mmol/L (ref 0–100)
pCO2 arterial: 63.1 mmHg — ABNORMAL HIGH (ref 32.0–48.0)
pO2, Arterial: 150 mmHg — ABNORMAL HIGH (ref 83.0–108.0)

## 2016-05-19 LAB — RAPID URINE DRUG SCREEN, HOSP PERFORMED
Amphetamines: NOT DETECTED
BARBITURATES: NOT DETECTED
BENZODIAZEPINES: POSITIVE — AB
COCAINE: NOT DETECTED
OPIATES: NOT DETECTED
Tetrahydrocannabinol: NOT DETECTED

## 2016-05-19 LAB — I-STAT CG4 LACTIC ACID, ED: Lactic Acid, Venous: 2.11 mmol/L (ref 0.5–1.9)

## 2016-05-19 LAB — ACETAMINOPHEN LEVEL: Acetaminophen (Tylenol), Serum: <10 ug/mL — ABNORMAL LOW (ref 10–30)

## 2016-05-19 LAB — CBG MONITORING, ED: GLUCOSE-CAPILLARY: 580 mg/dL — AB (ref 65–99)

## 2016-05-19 LAB — SALICYLATE LEVEL: Salicylate Lvl: <7 mg/dL (ref 2.8–30.0)

## 2016-05-19 LAB — ETHANOL: Alcohol, Ethyl (B): <5 mg/dL (ref <5)

## 2016-05-19 LAB — I-STAT TROPONIN, ED: TROPONIN I, POC: 0.55 ng/mL — AB (ref 0.00–0.08)

## 2016-05-19 LAB — BRAIN NATRIURETIC PEPTIDE: B Natriuretic Peptide: 520 pg/mL — ABNORMAL HIGH (ref 0.0–100.0)

## 2016-05-19 MED ORDER — INSULIN ASPART 100 UNIT/ML ~~LOC~~ SOLN
10.0000 [IU] | Freq: Once | SUBCUTANEOUS | Status: AC
  Administered 2016-05-19: 10 [IU] via INTRAVENOUS
  Filled 2016-05-19: qty 1

## 2016-05-19 MED ORDER — ALBUTEROL (5 MG/ML) CONTINUOUS INHALATION SOLN
10.0000 mg/h | INHALATION_SOLUTION | RESPIRATORY_TRACT | Status: DC
  Administered 2016-05-19: 10 mg/h via RESPIRATORY_TRACT
  Filled 2016-05-19 (×2): qty 20

## 2016-05-19 MED ORDER — VANCOMYCIN HCL IN DEXTROSE 1-5 GM/200ML-% IV SOLN
1000.0000 mg | Freq: Once | INTRAVENOUS | Status: AC
  Administered 2016-05-19: 1000 mg via INTRAVENOUS
  Filled 2016-05-19: qty 200

## 2016-05-19 MED ORDER — NALOXONE HCL 0.4 MG/ML IJ SOLN
0.4000 mg | Freq: Once | INTRAMUSCULAR | Status: AC
  Administered 2016-05-19: 0.4 mg via INTRAVENOUS
  Filled 2016-05-19: qty 1

## 2016-05-19 MED ORDER — PIPERACILLIN-TAZOBACTAM 3.375 G IVPB 30 MIN
3.3750 g | Freq: Once | INTRAVENOUS | Status: AC
  Administered 2016-05-19: 3.375 g via INTRAVENOUS
  Filled 2016-05-19: qty 50

## 2016-05-19 MED ORDER — SODIUM CHLORIDE 0.9 % IV BOLUS (SEPSIS)
1000.0000 mL | Freq: Once | INTRAVENOUS | Status: AC
  Administered 2016-05-19: 1000 mL via INTRAVENOUS

## 2016-05-19 NOTE — ED Notes (Signed)
Pt placed on bipap by RT

## 2016-05-19 NOTE — ED Notes (Signed)
Checked pt CBG 580, RN Tori informed

## 2016-05-19 NOTE — ED Provider Notes (Signed)
[facility] DEPT Provider Note   CSN: [id number] Arrival date & time: 05/19/16  2206     History   Chief Complaint Chief Complaint  Patient presents with  . Weakness  . Shortness of Breath    HPI [name] is a 51 y.o. female.  HPI Level V caveat. Patient found unresponsive and saturations in the 60s. EMS called for transport. Patient lives with mother. Given DuoNeb en route. Noted to be hyperglycemic. Past Medical History:  Diagnosis Date  . Anxiety   . Arthritis    LEFT HIP  . Asthma    HOSPITALIZED WITH EXCERBATION OF ASTHMA - AND BRONCHITIS AND THE FLU DEC 2013  . Depression   . Diabetes mellitus    ON INSULIN AND ORAL MEDICATIONS  . GERD (gastroesophageal reflux disease)   . HIV infection ([place]) 2000  . Hypertension   . Neuropathy    NEUROPATHY HANDS AND FEET  . Pain    SEVERE PAIN LEFT HIP - HX OF LEFT HIP PINNING JULY 2013  . Shortness of breath    ALLERGIES ARE "ACTING UP"    Patient Active Problem List   Diagnosis Date Noted  . Difficult intravenous access   . Acute on chronic respiratory failure with hypoxia ([place]) 05/19/2016  . NSTEMI (non-ST elevated myocardial infarction) ([place]) 05/19/2016  . Acute metabolic encephalopathy 78/29/5621  . Diabetic ketoacidosis without coma associated with type 2 diabetes mellitus ([place])   . SOB (shortness of breath)   . Hyperlipidemia   . Class 3 obesity due to excess calories with serious comorbidity and body mass index (BMI) of 40.0 to 44.9 in adult [facility])   . Obesity hypoventilation syndrome ([place])   . Sepsis ([place]) 04/04/2016  . DKA, type 2 ([place]) 04/04/2016  . Influenza B   . Diastolic dysfunction 30/86/5784  . Diabetic neuropathy ([place]) 11/10/2014  . Chronic kidney disease (CKD), stage IV (severe) ([place]) 11/10/2014  . Fracture of distal femur ([place]) 07/08/2014  . Osteoporosis 07/08/2014  . Femur fracture, left ([place]) 07/08/2014  . Colles' fracture of left radius   . Left hip pain 05/03/2014  . Hip pain     . CAP (community acquired pneumonia) 07/25/2012  . Perimenopausal symptoms 02/13/2012  . Hyponatremia 12/29/2011  . Asthma exacerbation attacks 12/28/2011  . Bronchitis 12/28/2011  . Repeated falls 07/30/2011  . Hip fracture, left ([place]) 07/16/2011  . Femoral neck fracture ([place]) 07/16/2011  . Dyslipidemia 12/23/2008  . THRUSH 05/20/2008  . Human immunodeficiency virus (HIV) disease ([place]) 04/19/2008  . Diabetes mellitus type 2 with complications, uncontrolled ([place]) 04/19/2008  . Mood disorder ([place]) 04/19/2008  . Hereditary and idiopathic peripheral neuropathy 04/19/2008  . Essential hypertension 04/19/2008    Past Surgical History:  Procedure Laterality Date  . AMPUTATION Left 11/18/2014   Procedure: LEFT BELOW KNEE AMPUTATION;  Surgeon: Leandrew Koyanagi, MD;  Location: [facility];  Service: Orthopedics;  Laterality: Left;  . CAST APPLICATION Left 06/09/6293   Procedure: CAST APPLICATION (FIBERGLASS);  Surgeon: Latanya Maudlin, MD;  Location: [facility];  Service: Orthopedics;  Laterality: Left;  CLOSED REDUCTION OF LEFT COLLES FRACTURE WITH SHORT ARM CAST  . HARDWARE REMOVAL Left 05/05/2014   Procedure: HARDWARE REMOVAL LEFT HIP;  Surgeon: Latanya Maudlin, MD;  Location: [facility];  Service: Orthopedics;  Laterality: Left;  REMOVAL BIOMET 6.5-8.0 CANNULATED SCREW  . HIP ARTHROPLASTY Left 05/05/2014   Procedure: ARTHROPLASTY OPEN REDUCTION INTERNAL FIXATION LEFT HIP AND REMOVAL OF TWO CANNULATED SCREW;  Surgeon: Latanya Maudlin, MD;  Location:  [facility];  Service: Orthopedics;  Laterality: Left;  . HIP PINNING,CANNULATED  07/16/2011   Procedure: CANNULATED HIP PINNING;  Surgeon: Gearlean Alf, MD;  [facility];  Service: Orthopedics;  Laterality: Left;  . HIP PINNING,CANNULATED Left 04/09/2012   Procedure: CANNULATED HIP PINNING AND HARDWARE REVISION;  Surgeon: Gearlean Alf, MD;  Location[facility];  Service: Orthopedics;  Laterality: Left;  . I&D EXTREMITY Left 11/16/2014   Procedure:  DEBRIDEMENT OF LEFT  FOOT POSSIBLE APPLICATION OF INTEGRIA AND VAC ;  Surgeon: Irene Limbo, MD;  Location: [facility];  Service: Plastics;  Laterality: Left;  . PERIPHERAL VASCULAR CATHETERIZATION N/A 11/15/2014   Procedure: Abdominal Aortogram;  Surgeon: Conrad [place]ote, MD;  Location: [facility];  Service: Cardiovascular;  Laterality: N/A;  . PERIPHERAL VASCULAR CATHETERIZATION  11/15/2014   Procedure: Lower Extremity Angiography;  Surgeon: Conrad , MD;  Location: [facility];  Service: Cardiovascular;;  . PERIPHERAL VASCULAR CATHETERIZATION Left 11/15/2014   Procedure: Peripheral Vascular Intervention;  Surgeon: Conrad , MD;  Location: [facility];  Service: Cardiovascular;  Laterality: Left;  popliteal artery stenting    OB History    Gravida Para Term Preterm AB Living   2 1 1  0 1 1   SAB TAB Ectopic Multiple Live Births   1   0 0         Home Medications    Prior to Admission medications   Medication Sig Start Date End Date Taking? Authorizing Provider  atenolol (TENORMIN) 25 MG tablet Take 25 mg by mouth daily.  02/09/16  Yes [provider]  budesonide (PULMICORT) 0.5 MG/2ML nebulizer solution Take 0.5 mg by nebulization 2 (two) times daily.   Yes [provider]  budesonide-formoterol (SYMBICORT) 80-4.5 MCG/ACT inhaler Inhale 2 puffs into the lungs 2 (two) times daily. 01/01/12  Yes Hosie Poisson, MD  clonazePAM (KLONOPIN) 0.5 MG tablet Take 0.5 tablets (0.25 mg total) by mouth 3 (three) times daily as needed (anxiety). 11/22/14  Yes Thurnell Lose, MD  diphenhydramine-acetaminophen (TYLENOL PM) 25-500 MG TABS tablet Take 2 tablets by mouth at bedtime.   Yes [provider]  diphenoxylate-atropine (LOMOTIL) 2.5-0.025 MG tablet Take 1 tablet by mouth 4 (four) times daily as needed for diarrhea or loose stools. 06/08/15  Yes Campbell Riches, MD  Eszopiclone 3 MG TABS Take 3 mg by mouth at bedtime as needed for sleep. Take immediately before  bedtime   Yes [provider]  fenofibrate (TRICOR) 145 MG tablet Take 1 tablet (145 mg total) by mouth daily. 01/06/13  Yes Campbell Riches, MD  furosemide (LASIX) 80 MG tablet Take 80 mg by mouth.   Yes [provider]  GENVOYA 150-150-200-10 MG TABS tablet TAKE 1 TABLET BY MOUTH EVERY DAY 12/19/15  Yes Campbell Riches, MD  HYDROcodone-acetaminophen (NORCO) 10-325 MG tablet Take 1 tablet by mouth every 6 (six) hours as needed for moderate pain or severe pain. 11/21/14  Yes Thurnell Lose, MD  insulin regular human CONCENTRATED (HUMULIN R U-500 KWIKPEN) 500 UNIT/ML kwikpen ADMINISTER DOSES 30 MINUTES BEFORE EACH MEAL. USE 60 UNITS BEFORE BREAKFAST, 50 UNITS BEFORE LUNCH AND 35 UNITS BEFORE DINNER Patient taking differently: Inject 35-60 Units into the skin See admin instructions. Inject 35-60 units subcutaneously 30 minutes before each meal - 60 units before breakfast, 50 units before lunch and 35 units before supper 04/18/16  Yes Elayne Snare, MD  ipratropium-albuterol (DUONEB) 0.5-2.5 (3) MG/3ML SOLN  Take 3 mLs by nebulization every 6 (six) hours as needed (coughing, wheezing).   Yes [provider]  liraglutide (VICTOZA) 18 MG/3ML SOPN ADMINISTER 1.8 MG UNDER THE SKIN DAILY Patient taking differently: Inject 1.8 mg into the skin daily.  04/18/16  Yes Elayne Snare, MD  metFORMIN (GLUCOPHAGE-XR) 750 MG 24 hr tablet Take 1 tablet (750 mg total) by mouth daily with breakfast. 03/07/16  Yes Elayne Snare, MD  pantoprazole (PROTONIX) 40 MG tablet Take 40 mg by mouth daily. 09/02/14  Yes [provider]  potassium chloride SA (K-DUR,KLOR-CON) 20 MEQ tablet Take 20 mEq by mouth daily.   Yes [provider]  pregabalin (LYRICA) 225 MG capsule Take 225 mg by mouth daily.   Yes [provider]  rosuvastatin (CRESTOR) 20 MG tablet Take 20 mg by mouth daily. Reported on 05/02/2015 09/02/14  Yes [provider]  TOUJEO SOLOSTAR 300 UNIT/ML SOPN INJECT 70  UNITS UNDER THE SKIN TWICE DAILY 03/15/16  Yes Elayne Snare, MD  aspirin EC 325 MG EC tablet Take 1 tablet (325 mg total) by mouth daily. 11/22/14   Thurnell Lose, MD  Cholecalciferol (VITAMIN D3) 5000 UNITS TABS Take 5,000 Units by mouth daily.     [provider]  cyclobenzaprine (FLEXERIL) 5 MG tablet Take 2 tablets (10 mg total) by mouth 3 (three) times daily as needed for muscle spasms. 11/22/14   Thurnell Lose, MD  dextromethorphan (DELSYM) 30 MG/5ML liquid Take 5 mLs (30 mg total) by mouth every 12 (twelve) hours as needed for cough. 04/09/16   Barton Dubois, MD  glucose blood (ONETOUCH VERIO) test strip Use to test blood sugar 3 times daily Patient not taking: Reported on 05/21/2016 03/05/16   Elayne Snare, MD  Insulin Pen Needle (NOVOTWIST) 32G X 5 MM MISC Use 2 per day to inject Toujeo and Victoza Patient not taking: Reported on 05/21/2016 11/11/14   Elayne Snare, MD  NOVOLOG 100 UNIT/ML injection ADMINISTER 15 TO 20 UNITS UNDER THE SKIN BEFORE MEALS Patient not taking: Reported on 04/04/2016 02/25/15   Elayne Snare, MD  vitamin E (VITAMIN E) 1000 UNIT capsule Take 1,000 Units by mouth daily.     [provider]    Family History Family History  Problem Relation Age of Onset  . Diabetes Mother   . Hypertension Mother   . Vision loss Mother   . Heart disease Mother   . Hypertension Father   . Thyroid disease Neg Hx     Social History Social History  Substance Use Topics  . Smoking status: Current Every Day Smoker    Packs/day: 0.50    Years: 35.00    Types: E-cigarettes, Cigarettes  . Smokeless tobacco: Never Used  . Alcohol use No     Comment: no drink since 2008     Allergies   Cortizone-10 [hydrocortisone]   Review of Systems Review of Systems  Unable to perform ROS: Mental status change     Physical Exam Updated Vital Signs BP (!) 110/96 (BP Location: Right Arm)   Pulse (!) 106   Temp 98.5 F (36.9 C) (Oral)   Resp 20   Ht 5\' 10"  (1.778  m) Comment: from March 2018 records  Wt 113.4 kg (250 lb) Comment: from March 2018 records  LMP 11/16/2010   SpO2 (!) 87%   BMI 35.87 kg/m   Physical Exam  Constitutional: She appears well-developed and well-nourished.  Disheveled and smells of urine  HENT:  Head: Normocephalic and atraumatic.  Mouth/Throat: Oropharynx is clear and moist.  No obvious trauma. Small amount of dried vomit on face  Eyes: EOM are normal. Pupils are equal, round, and reactive to light.  Pinpoint pupils bilaterally.  Neck: Normal range of motion. Neck supple.  No meningismus. No JVD.  Cardiovascular: Normal rate and regular rhythm.   Pulmonary/Chest: She has wheezes.  Expiratory wheezing throughout  Abdominal: Soft. Bowel sounds are normal. There is no tenderness. There is no rebound and no guarding.  Musculoskeletal: Normal range of motion. She exhibits no edema or tenderness.  Left below knee amputation. No right lower extremity swelling or tenderness. Distal pulses intact.  Neurological:  Patient is lethargic. Will follow simple commands. Appears to move all extremities. Sensation is grossly intact.  Skin: Skin is warm and dry. Capillary refill takes less than 2 seconds. No rash noted. No erythema.  Nursing note and vitals reviewed.    ED Treatments / Results  Labs (all labs ordered are listed, but only abnormal results are displayed) Labs Reviewed  BRAIN NATRIURETIC PEPTIDE - Abnormal; Notable for the following:       Result Value   B Natriuretic Peptide 520.0 (*)    All other components within normal limits  CBC WITH DIFFERENTIAL/PLATELET - Abnormal; Notable for the following:    MCHC 29.9 (*)    RDW 17.7 (*)    Neutro Abs 8.0 (*)    All other components within normal limits  COMPREHENSIVE METABOLIC PANEL - Abnormal; Notable for the following:    Chloride 98 (*)    Glucose, Bld 567 (*)    Creatinine, Ser 2.23 (*)    Albumin 3.4 (*)    GFR calc non Af Amer 24 (*)    GFR calc Af Amer 28  (*)    All other components within normal limits  RAPID URINE DRUG SCREEN, HOSP PERFORMED - Abnormal; Notable for the following:    Benzodiazepines POSITIVE (*)    All other components within normal limits  ACETAMINOPHEN LEVEL - Abnormal; Notable for the following:    Acetaminophen (Tylenol), Serum <10 (*)    All other components within normal limits  URINALYSIS, ROUTINE W REFLEX MICROSCOPIC - Abnormal; Notable for the following:    Glucose, UA >=500 (*)    Hgb urine dipstick SMALL (*)    Ketones, ur 5 (*)    Protein, ur 30 (*)    Bacteria, UA RARE (*)    Squamous Epithelial / LPF 0-5 (*)    All other components within normal limits  TROPONIN I - Abnormal; Notable for the following:    Troponin I 0.83 (*)    All other components within normal limits  TROPONIN I - Abnormal; Notable for the following:    Troponin I 1.21 (*)    All other components within normal limits  TROPONIN I - Abnormal; Notable for the following:    Troponin I 0.85 (*)    All other components within normal limits  HEMOGLOBIN A1C - Abnormal; Notable for the following:    Hgb A1c MFr Bld 9.8 (*)    All other components within normal limits  LIPID PANEL - Abnormal; Notable for the following:    Triglycerides 225 (*)    HDL 31 (*)    VLDL 45 (*)    All other components within normal limits  LACTIC ACID, PLASMA - Abnormal; Notable for the following:    Lactic Acid, Venous 2.2 (*)    All other components within normal limits  LACTIC ACID,  PLASMA - Abnormal; Notable for the following:    Lactic Acid, Venous 2.7 (*)    All other components within normal limits  D-DIMER, QUANTITATIVE (NOT AT [facility]) - Abnormal; Notable for the following:    D-Dimer, Quant 0.58 (*)    All other components within normal limits  LACTIC ACID, PLASMA - Abnormal; Notable for the following:    Lactic Acid, Venous 3.0 (*)    All other components within normal limits  HEPARIN LEVEL (UNFRACTIONATED) - Abnormal; Notable for the following:      Heparin Unfractionated 0.26 (*)    All other components within normal limits  HEPARIN LEVEL (UNFRACTIONATED) - Abnormal; Notable for the following:    Heparin Unfractionated <0.10 (*)    All other components within normal limits  CBC - Abnormal; Notable for the following:    Hemoglobin 10.5 (*)    MCH 25.8 (*)    MCHC 28.8 (*)    RDW 17.7 (*)    All other components within normal limits  BASIC METABOLIC PANEL - Abnormal; Notable for the following:    Potassium 2.8 (*)    Glucose, Bld 132 (*)    Creatinine, Ser 1.07 (*)    Calcium 8.4 (*)    GFR calc non Af Amer 59 (*)    All other components within normal limits  GLUCOSE, CAPILLARY - Abnormal; Notable for the following:    Glucose-Capillary 156 (*)    All other components within normal limits  GLUCOSE, CAPILLARY - Abnormal; Notable for the following:    Glucose-Capillary 176 (*)    All other components within normal limits  GLUCOSE, CAPILLARY - Abnormal; Notable for the following:    Glucose-Capillary 109 (*)    All other components within normal limits  GLUCOSE, CAPILLARY - Abnormal; Notable for the following:    Glucose-Capillary 319 (*)    All other components within normal limits  CBG MONITORING, ED - Abnormal; Notable for the following:    Glucose-Capillary 580 (*)    All other components within normal limits  I-STAT TROPOININ, ED - Abnormal; Notable for the following:    Troponin i, poc 0.55 (*)    All other components within normal limits  I-STAT CG4 LACTIC ACID, ED - Abnormal; Notable for the following:    Lactic Acid, Venous 2.11 (*)    All other components within normal limits  I-STAT CHEM 8, ED - Abnormal; Notable for the following:    Chloride 98 (*)    BUN 26 (*)    Creatinine, Ser 1.90 (*)    Glucose, Bld 563 (*)    Calcium, Ion 1.10 (*)    Hemoglobin 16.3 (*)    HCT 48.0 (*)    All other components within normal limits  I-STAT ARTERIAL BLOOD GAS, ED - Abnormal; Notable for the following:    pH,  Arterial 7.246 (*)    pCO2 arterial 63.1 (*)    pO2, Arterial 150.0 (*)    All other components within normal limits  CBG MONITORING, ED - Abnormal; Notable for the following:    Glucose-Capillary 510 (*)    All other components within normal limits  CBG MONITORING, ED - Abnormal; Notable for the following:    Glucose-Capillary 481 (*)    All other components within normal limits  CBG MONITORING, ED - Abnormal; Notable for the following:    Glucose-Capillary 418 (*)    All other components within normal limits  I-STAT CG4 LACTIC ACID, ED - Abnormal; Notable for the  following:    Lactic Acid, Venous 2.96 (*)    All other components within normal limits  I-STAT CG4 LACTIC ACID, ED - Abnormal; Notable for the following:    Lactic Acid, Venous 3.14 (*)    All other components within normal limits  CBG MONITORING, ED - Abnormal; Notable for the following:    Glucose-Capillary 347 (*)    All other components within normal limits  CBG MONITORING, ED - Abnormal; Notable for the following:    Glucose-Capillary 245 (*)    All other components within normal limits  CULTURE, BLOOD (ROUTINE X 2)  CULTURE, BLOOD (ROUTINE X 2)  MRSA PCR SCREENING  CULTURE, EXPECTORATED SPUTUM-ASSESSMENT  GRAM STAIN  ETHANOL  SALICYLATE LEVEL  INFLUENZA PANEL BY PCR (TYPE A & B)  PROCALCITONIN  PROTIME-INR  STREP PNEUMONIAE URINARY ANTIGEN  CREATININE, URINE, RANDOM  UREA NITROGEN, URINE  HEPARIN LEVEL (UNFRACTIONATED)  LACTIC ACID, PLASMA  T-HELPER CELLS (CD4) COUNT (NOT AT [facility])  HEPARIN LEVEL (UNFRACTIONATED)    EKG  EKG Interpretation  Date/Time:  Saturday May 19 2016 22:16:36 EDT Ventricular Rate:  103 PR Interval:    QRS Duration: 81 QT Interval:  378 QTC Calculation: 495 R Axis:   -8 Text Interpretation:  Sinus tachycardia Probable left atrial enlargement Nonspecific T abnormalities, diffuse leads Borderline prolonged QT interval Confirmed by Orpah Greek [phone]) on 05/20/2016  4:51:15 PM       Radiology Ct Head Wo Contrast  Result Date: 05/20/2016 CLINICAL DATA:  Acute encephalopathy, weakness for 2 weeks EXAM: CT HEAD WITHOUT CONTRAST TECHNIQUE: Contiguous axial images were obtained from the base of the skull through the vertex without intravenous contrast. COMPARISON:  None. FINDINGS: Brain: Mild superficial and central atrophy with minimal small vessel ischemic disease of periventricular white matter. No intra-axial mass, hemorrhage or midline. No extra-axial fluid collections. Vascular: Mild atherosclerosis of the carotid siphons no hyperdense vessels. Skull: Negative for fracture or focal lesion. Sinuses/Orbits: The visualized mastoids, paranasal sinuses, orbits and globes are unremarkable. Other: None. IMPRESSION: Mild atrophy with minimal small vessel ischemic disease of periventricular white matter. No acute intracranial appearing abnormality. Electronically Signed   By: Ashley Royalty M.D.   On: 05/20/2016 01:50   Dg Chest Port 1 View  Result Date: 05/20/2016 CLINICAL DATA:  Evaluate central line placement EXAM: PORTABLE CHEST 1 VIEW COMPARISON:  May 19, 2016 FINDINGS: A new right central line terminates near the caval atrial junction. No pneumothorax. Mild bibasilar atelectasis. No other acute abnormality or change. IMPRESSION: The new right central line is in good position.  No pneumothorax. Electronically Signed   By: Dorise Bullion III M.D   On: 05/20/2016 18:32   Dg Chest Port 1 View  Result Date: 05/19/2016 CLINICAL DATA:  Weakness EXAM: PORTABLE CHEST 1 VIEW COMPARISON:  04/25/2016 FINDINGS: The heart size and mediastinal contours are within normal limits. Both lungs are clear. Chronic bilateral healed sixth and seventh rib fractures. IMPRESSION: No active disease. Chronic bilateral sixth and seventh rib fractures. Electronically Signed   By: Ashley Royalty M.D.   On: 05/19/2016 23:06    Procedures Procedures (including critical care time)  Medications  Ordered in ED Medications  insulin glargine (LANTUS) injection 50 Units (50 Units Subcutaneous Given 05/21/16 1029)  nicotine (NICODERM CQ - dosed in mg/24 hours) patch 21 mg (21 mg Transdermal Patch Removed 05/21/16 0959)  aspirin suppository 300 mg (300 mg Rectal Given 05/21/16 1029)  doxycycline (VIBRAMYCIN) 100 mg in dextrose 5 % 250 mL IVPB (  0 mg Intravenous Stopped 05/21/16 1233)  hydrALAZINE (APRESOLINE) injection 5 mg (not administered)  famotidine (PEPCID) IVPB 20 mg premix (0 mg Intravenous Stopped 05/21/16 0941)  acetaminophen (TYLENOL) tablet 650 mg (650 mg Oral Given 05/21/16 1152)  ondansetron (ZOFRAN) injection 4 mg (not administered)  0.9 %  sodium chloride infusion ( Intravenous Rate/Dose Change 05/21/16 0858)  budesonide (PULMICORT) nebulizer solution 0.5 mg (0.5 mg Nebulization Given 05/21/16 0806)  heparin ADULT infusion 100 units/mL (25000 units/215mL sodium chloride 0.45%) (1,450 Units/hr Intravenous New Bag/Given 05/20/16 2303)  white petrolatum (VASELINE) gel (not administered)  ipratropium (ATROVENT) nebulizer solution 0.5 mg (0.5 mg Nebulization Given 05/21/16 1124)  levalbuterol (XOPENEX) nebulizer solution 1.25 mg (1.25 mg Nebulization Given 05/21/16 1124)  predniSONE (DELTASONE) tablet 40 mg (40 mg Oral Given 05/21/16 0901)  insulin aspart (novoLOG) injection 0-20 Units (15 Units Subcutaneous Given 05/21/16 1317)  insulin aspart (novoLOG) injection 0-5 Units (not administered)  elvitegravir-cobicistat-emtricitabine-tenofovir (GENVOYA) 150-150-200-10 MG tablet 1 tablet (not administered)  rosuvastatin (CRESTOR) tablet 20 mg (not administered)  haloperidol lactate (HALDOL) injection 0.5 mg (0.5 mg Intravenous Given 05/21/16 1152)  LORazepam (ATIVAN) injection 0.5 mg (0.5 mg Intravenous Given 05/21/16 1354)  naloxone (NARCAN) injection 0.4 mg (0.4 mg Intravenous Given 05/19/16 2241)  insulin aspart (novoLOG) injection 10 Units (10 Units Intravenous Given 05/19/16 2241)  sodium  chloride 0.9 % bolus 1,000 mL (0 mLs Intravenous Stopped 05/20/16 0016)  vancomycin (VANCOCIN) IVPB 1000 mg/200 mL premix (0 mg Intravenous Stopped 05/20/16 0114)  piperacillin-tazobactam (ZOSYN) IVPB 3.375 g (0 g Intravenous Stopped 05/20/16 0016)  heparin bolus via infusion 4,000 Units (4,000 Units Intravenous Bolus from Bag 05/20/16 0156)  sodium chloride 0.9 % bolus 1,500 mL (0 mLs Intravenous Stopped 05/20/16 0352)  insulin aspart (novoLOG) injection 10 Units (10 Units Subcutaneous Given 05/20/16 0609)  sodium chloride 0.9 % bolus 500 mL (0 mLs Intravenous Stopped 05/20/16 0851)  insulin aspart (novoLOG) injection 15 Units (15 Units Subcutaneous Given 05/20/16 1034)  potassium chloride 10 mEq in 50 mL *CENTRAL LINE* IVPB (0 mEq Intravenous Stopped 05/21/16 1251)    CRITICAL CARE Performed by: Lita Mains, Jalexis Breed Total critical care time: 30 minutes Critical care time was exclusive of separately billable procedures and treating other patients. Critical care was necessary to treat or prevent imminent or life-threatening deterioration. Critical care was time spent personally by me on the following activities: development of treatment plan with patient and/or surrogate as well as nursing, discussions with consultants, evaluation of patient's response to treatment, examination of patient, obtaining history from patient or surrogate, ordering and performing treatments and interventions, ordering and review of laboratory studies, ordering and review of radiographic studies, pulse oximetry and re-evaluation of patient's condition. Initial Impression / Assessment and Plan / ED Course  I have reviewed the triage vital signs and the nursing notes.  Pertinent labs & imaging results that were available during my care of the patient were reviewed by me and considered in my medical decision making (see chart for details).     Patient is on multiple sedatives. Bilateral pinpoint pupils. Will give trial of Narcan.  No evidence of any trauma. Will also give continuous nebulized treatment. No response with Narcan. Patient has some improvement in her wheezing with the continuous neb. ABG with significantly elevated PCO2. Patient was placed on BiPAP with improvement in her level of alertness. Covered broadly for possible sepsis. Discussed with hospitalist and will admit patient to step down bed. Final Clinical Impressions(s) / ED Diagnoses   Final diagnoses:  Acute  encephalopathy  SOB (shortness of breath)    New Prescriptions Current Discharge Medication List       Julianne Rice, MD 05/21/16 1425

## 2016-05-19 NOTE — H&P (Addendum)
History and Physical    QUINLEE SCIARRA NUU:[id number] DOB: [birth year] DOA: 05/19/2016  Referring MD/NP/PA:   PCP: Michel Harrow, PA-C   Patient coming from:  The patient is coming from home.  At baseline, pt is independent for most of ADL.        Chief Complaint: Generalized weakness, shortness breath, altered mental status.  HPI: [name] is a 51 y.o. female with medical history significant of s/p of left BKA, hypertension, HIV (CD4 80 on 04/04/16 and VL<20 on 05/20/15) diabetes mellitus, asthma, GERD, depression, anxiety, HIV, tobacco abuse, chronic kidney disease-stage IV, who presents with generalized weakness, SOB, altered mental status.  Patient has AMS,  is poor historian and is unable to provide accurate medical history, therefore, most of the history is obtained by discussing the case with ED physician, per EMS report, and with the nursing staff. Per report, pt had pneumonia 2 weeks ago. Now has weakness and respiratory distress. She was found to have oxygen 60% on room air. When I saw pt in ED, she is confused, but knows her own name .she is oriented to place, but not to time. She denies chest pain. No active cough, nausea, vomiting, diarrhea noted. She moves all extremities.  ED Course: pt was found to have negative acid 2.11, WBC 9.2, troponin 0.55, Tylenol level less than 10, salicylate level less than 7, negative urinalysis, worsening renal function, temperature 98.6, tachycardia, tachypnea, oxygen saturation 76% which improved to 97% on BiPAP. Chest x-rays negative for infiltration, but showed chronic seventh and sixth rib fracture. Patient is admitted to stepdown as inpatient.  Review of Systems: Could not be reviewed accurately due to altered mental status  Allergy:  Allergies  Allergen Reactions  . Cortizone-10 [Hydrocortisone] Rash    Per patient, given local injection at knee and developed rash at local site.     Past Medical History:  Diagnosis Date  .  Anxiety   . Arthritis    LEFT HIP  . Asthma    HOSPITALIZED WITH EXCERBATION OF ASTHMA - AND BRONCHITIS AND THE FLU DEC 2013  . Depression   . Diabetes mellitus    ON INSULIN AND ORAL MEDICATIONS  . GERD (gastroesophageal reflux disease)   . HIV infection ([place]) 2000  . Hypertension   . Neuropathy    NEUROPATHY HANDS AND FEET  . Pain    SEVERE PAIN LEFT HIP - HX OF LEFT HIP PINNING JULY 2013  . Shortness of breath    ALLERGIES ARE "ACTING UP"    Past Surgical History:  Procedure Laterality Date  . AMPUTATION Left 11/18/2014   Procedure: LEFT BELOW KNEE AMPUTATION;  Surgeon: Leandrew Koyanagi, MD;  Location: [facility];  Service: Orthopedics;  Laterality: Left;  . CAST APPLICATION Left 03/05/7423   Procedure: CAST APPLICATION (FIBERGLASS);  Surgeon: Latanya Maudlin, MD;  Location: [facility];  Service: Orthopedics;  Laterality: Left;  CLOSED REDUCTION OF LEFT COLLES FRACTURE WITH SHORT ARM CAST  . HARDWARE REMOVAL Left 05/05/2014   Procedure: HARDWARE REMOVAL LEFT HIP;  Surgeon: Latanya Maudlin, MD;  Location: [facility];  Service: Orthopedics;  Laterality: Left;  REMOVAL BIOMET 6.5-8.0 CANNULATED SCREW  . HIP ARTHROPLASTY Left 05/05/2014   Procedure: ARTHROPLASTY OPEN REDUCTION INTERNAL FIXATION LEFT HIP AND REMOVAL OF TWO CANNULATED SCREW;  Surgeon: Latanya Maudlin, MD;  Location: [facility];  Service: Orthopedics;  Laterality: Left;  . HIP PINNING,CANNULATED  07/16/2011   Procedure: CANNULATED HIP PINNING;  Surgeon: Gearlean Alf, MD;  Location:  [facility];  Service: Orthopedics;  Laterality: Left;  . HIP PINNING,CANNULATED Left 04/09/2012   Procedure: CANNULATED HIP PINNING AND HARDWARE REVISION;  Surgeon: Gearlean Alf, MD;  Location: [facility];  Service: Orthopedics;  Laterality: Left;  . I&D EXTREMITY Left 11/16/2014   Procedure:  DEBRIDEMENT OF LEFT FOOT POSSIBLE APPLICATION OF INTEGRIA AND VAC ;  Surgeon: Irene Limbo, MD;  Location: [place] [facility];  Service: Plastics;  Laterality: Left;  . PERIPHERAL VASCULAR  CATHETERIZATION N/A 11/15/2014   Procedure: Abdominal Aortogram;  Surgeon: Conrad Hurtsboro, MD;  Location: [facility];  Service: Cardiovascular;  Laterality: N/A;  . PERIPHERAL VASCULAR CATHETERIZATION  11/15/2014   Procedure: Lower Extremity Angiography;  Surgeon: Conrad Redfield, MD;  Location: [place] [facility];  Service: Cardiovascular;;  . PERIPHERAL VASCULAR CATHETERIZATION Left 11/15/2014   Procedure: Peripheral Vascular Intervention;  Surgeon: Conrad Roseland, MD;  Location: [place] [facility] [facility];  Service: Cardiovascular;  Laterality: Left;  popliteal artery stenting    Social History:  reports that she has been smoking E-cigarettes and Cigarettes.  She has a 17.50 pack-year smoking history. She has never used smokeless tobacco. She reports that she does not drink alcohol or use drugs.  Family History:  Family History  Problem Relation Age of Onset  . Diabetes Mother   . Hypertension Mother   . Vision loss Mother   . Heart disease Mother   . Hypertension Father   . Thyroid disease Neg Hx      Prior to Admission medications   Medication Sig Start Date End Date Taking? Authorizing Provider  ARIPiprazole (ABILIFY) 10 MG tablet Take 10 mg by mouth daily. 10/18/14   [provider]  aspirin EC 325 MG EC tablet Take 1 tablet (325 mg total) by mouth daily. 11/22/14   Thurnell Lose, MD  atenolol (TENORMIN) 25 MG tablet Take 25 mg by mouth daily.  02/09/16   [provider]  budesonide-formoterol (SYMBICORT) 80-4.5 MCG/ACT inhaler Inhale 2 puffs into the lungs 2 (two) times daily. 01/01/12   Hosie Poisson, MD  Cholecalciferol (VITAMIN D3) 5000 UNITS TABS Take 5,000 Units by mouth daily.     [provider]  clonazePAM (KLONOPIN) 0.5 MG tablet Take 0.5 tablets (0.25 mg total) by mouth 3 (three) times daily as needed (anxiety). Patient taking differently: Take 0.5 mg by mouth 3 (three) times daily as needed for anxiety (anxiety).  11/22/14   Thurnell Lose, MD    cyclobenzaprine (FLEXERIL) 5 MG tablet Take 2 tablets (10 mg total) by mouth 3 (three) times daily as needed for muscle spasms. 11/22/14   Thurnell Lose, MD  dextromethorphan (DELSYM) 30 MG/5ML liquid Take 5 mLs (30 mg total) by mouth every 12 (twelve) hours as needed for cough. 04/09/16   Barton Dubois, MD  diphenoxylate-atropine (LOMOTIL) 2.5-0.025 MG tablet Take 1 tablet by mouth 4 (four) times daily as needed for diarrhea or loose stools. 06/08/15   Campbell Riches, MD  eszopiclone (LUNESTA) 2 MG TABS tablet Take 3 mg by mouth at bedtime as needed for sleep. Take immediately before bedtime    [provider]  fenofibrate (TRICOR) 145 MG tablet Take 1 tablet (145 mg total) by mouth daily. Patient not taking: Reported on 04/04/2016 01/06/13   Campbell Riches, MD  furosemide (LASIX) 80 MG tablet TK 1 T PO QD 02/15/16   [provider]  GENVOYA 150-150-200-10 MG TABS tablet TAKE 1 TABLET BY MOUTH EVERY DAY 12/19/15  Campbell Riches, MD  glucose blood (ONETOUCH VERIO) test strip Use to test blood sugar 3 times daily 03/05/16   Elayne Snare, MD  HYDROcodone-acetaminophen [facility]) 10-325 MG tablet Take 1 tablet by mouth every 6 (six) hours as needed for moderate pain or severe pain. 11/21/14   Thurnell Lose, MD  Insulin Pen Needle (NOVOTWIST) 32G X 5 MM MISC Use 2 per day to inject Toujeo and Victoza 11/11/14   Elayne Snare, MD  insulin regular human CONCENTRATED (HUMULIN R U-500 KWIKPEN) 500 UNIT/ML kwikpen ADMINISTER DOSES 30 MINUTES BEFORE EACH MEAL. USE 60 UNITS BEFORE BREAKFAST, 50 UNITS BEFORE LUNCH AND 35 UNITS BEFORE DINNER 04/18/16   Elayne Snare, MD  liraglutide (VICTOZA) 18 MG/3ML SOPN ADMINISTER 1.8 MG UNDER THE SKIN DAILY 04/18/16   Elayne Snare, MD  metFORMIN (GLUCOPHAGE-XR) 750 MG 24 hr tablet Take 1 tablet (750 mg total) by mouth daily with breakfast. 03/07/16   Elayne Snare, MD  NOVOLOG 100 UNIT/ML injection ADMINISTER 15 TO 20 UNITS UNDER THE SKIN BEFORE MEALS Patient  not taking: Reported on 04/04/2016 02/25/15   Elayne Snare, MD  pantoprazole (PROTONIX) 40 MG tablet Take 40 mg by mouth daily. 09/02/14   [provider]  rosuvastatin (CRESTOR) 20 MG tablet Take 20 mg by mouth daily. Reported on 05/02/2015 09/02/14   [provider]  tiZANidine (ZANAFLEX) 4 MG tablet TK 1 T PO Q 8 H 03/07/16   [provider]  TOUJEO SOLOSTAR 300 UNIT/ML SOPN INJECT 70 UNITS UNDER THE SKIN TWICE DAILY 03/15/16   Elayne Snare, MD  vitamin E (VITAMIN E) 1000 UNIT capsule Take 1,000 Units by mouth daily.     [provider]    Physical Exam: Vitals:   05/20/16 0130 05/20/16 0200 05/20/16 0230 05/20/16 0300  BP: 116/66 122/73 (!) 110/51 129/64  Pulse: (!) 101 98    Resp: 19 15 17 15   Temp:      TempSrc:      SpO2:  94%    Weight:      Height:       General: Not in acute distress HEENT:       Eyes: PERRL, EOMI, no scleral icterus.       ENT: No discharge from the ears and nose, no pharynx injection, no tonsillar enlargement.        Neck: No JVD, no bruit, no mass felt. Heme: No neck lymph node enlargement. Cardiac: S1/S2, RRR, No murmurs, No gallops or rubs. Respiratory: No rales, wheezing, rhonchi or rubs. (per EDP, Dr. Lita Mains, patient had wheezing earlier, but not wheezing currently. GI: Soft, nondistended, nontender, no rebound pain, no organomegaly, BS present. GU: No hematuria Ext: No pitting leg edema bilaterally. 2+DP/PT pulse on the right. S/p of L BKA. Musculoskeletal: No joint deformities, No joint redness or warmth, no limitation of ROM in spin. Skin: No rashes.  Neuro: confused, oriented to place and knows her own name. Cranial nerves II-XII grossly intact, moves all extremities.  Psych: Patient is not psychotic, no suicidal or hemocidal ideation.  Labs on Admission: I have personally reviewed following labs and imaging studies  CBC:  Recent Labs Lab 05/19/16 2217 05/19/16 2243  WBC 9.6  --   NEUTROABS 8.0*  --   HGB  13.7 16.3*  HCT 45.8 48.0*  MCV 91.6  --   PLT 340  --    Basic Metabolic Panel:  Recent Labs Lab 05/19/16 2217 05/19/16 2243  NA 135 138  K 4.5 4.5  CL 98* 98*  CO2 23  --   GLUCOSE 567* 563*  BUN 19 26*  CREATININE 2.23* 1.90*  CALCIUM 9.0  --    GFR: Estimated Creatinine Clearance: 47.3 mL/min (A) (by C-G formula based on SCr of 1.9 mg/dL (H)). Liver Function Tests:  Recent Labs Lab 05/19/16 2217  AST 28  ALT 17  ALKPHOS 80  BILITOT 0.5  PROT 7.5  ALBUMIN 3.4*   No results for input(s): LIPASE, AMYLASE in the last 168 hours. No results for input(s): AMMONIA in the last 168 hours. Coagulation Profile:  Recent Labs Lab 05/20/16 0049  INR 1.06   Cardiac Enzymes:  Recent Labs Lab 05/20/16 0049  TROPONINI 0.83*   BNP (last 3 results) No results for input(s): PROBNP in the last 8760 hours. HbA1C: No results for input(s): HGBA1C in the last 72 hours. CBG:  Recent Labs Lab 05/19/16 2215 05/20/16 0207  GLUCAP 580* 510*   Lipid Profile: No results for input(s): CHOL, HDL, LDLCALC, TRIG, CHOLHDL, LDLDIRECT in the last 72 hours. Thyroid Function Tests: No results for input(s): TSH, T4TOTAL, FREET4, T3FREE, THYROIDAB in the last 72 hours. Anemia Panel: No results for input(s): VITAMINB12, FOLATE, FERRITIN, TIBC, IRON, RETICCTPCT in the last 72 hours. Urine analysis:    Component Value Date/Time   COLORURINE YELLOW 05/19/2016 2232   APPEARANCEUR CLEAR 05/19/2016 2232   LABSPEC 1.022 05/19/2016 2232   PHURINE 5.0 05/19/2016 2232   GLUCOSEU >=500 (A) 05/19/2016 2232   GLUCOSEU 250 (A) 03/16/2015 1612   HGBUR SMALL (A) 05/19/2016 2232   BILIRUBINUR NEGATIVE 05/19/2016 2232   KETONESUR 5 (A) 05/19/2016 2232   PROTEINUR 30 (A) 05/19/2016 2232   UROBILINOGEN 0.2 03/16/2015 1612   NITRITE NEGATIVE 05/19/2016 2232   LEUKOCYTESUR NEGATIVE 05/19/2016 2232   Sepsis Labs: @LABRCNTIP (procalcitonin:4,lacticidven:4) ) Recent Results (from the past 240  hour(s))  Culture, blood (Routine X 2) w Reflex to ID Panel     Status: None (Preliminary result)   Collection Time: 05/19/16 10:17 PM  Result Value Ref Range Status   Specimen Description BLOOD RIGHT ANTECUBITAL  Final   Special Requests   Final    BOTTLES DRAWN AEROBIC AND ANAEROBIC Blood Culture adequate volume   Culture PENDING  Incomplete   Report Status PENDING  Incomplete  Culture, blood (Routine X 2) w Reflex to ID Panel     Status: None (Preliminary result)   Collection Time: 05/19/16 10:17 PM  Result Value Ref Range Status   Specimen Description BLOOD LEFT ANTECUBITAL  Final   Special Requests   Final    BOTTLES DRAWN AEROBIC AND ANAEROBIC Blood Culture adequate volume   Culture PENDING  Incomplete   Report Status PENDING  Incomplete     Radiological Exams on Admission: Ct Head Wo Contrast  Result Date: 05/20/2016 CLINICAL DATA:  Acute encephalopathy, weakness for 2 weeks EXAM: CT HEAD WITHOUT CONTRAST TECHNIQUE: Contiguous axial images were obtained from the base of the skull through the vertex without intravenous contrast. COMPARISON:  None. FINDINGS: Brain: Mild superficial and central atrophy with minimal small vessel ischemic disease of periventricular white matter. No intra-axial mass, hemorrhage or midline. No extra-axial fluid collections. Vascular: Mild atherosclerosis of the carotid siphons no hyperdense vessels. Skull: Negative for fracture or focal lesion. Sinuses/Orbits: The visualized mastoids, paranasal sinuses, orbits and globes are unremarkable. Other: None. IMPRESSION: Mild atrophy with minimal small vessel ischemic disease of periventricular white matter. No acute intracranial appearing abnormality. Electronically Signed   By: Meredith Leeds.D.  On: 05/20/2016 01:50   Dg Chest Port 1 View  Result Date: 05/19/2016 CLINICAL DATA:  Weakness EXAM: PORTABLE CHEST 1 VIEW COMPARISON:  04/25/2016 FINDINGS: The heart size and mediastinal contours are within normal  limits. Both lungs are clear. Chronic bilateral healed sixth and seventh rib fractures. IMPRESSION: No active disease. Chronic bilateral sixth and seventh rib fractures. Electronically Signed   By: Ashley Royalty M.D.   On: 05/19/2016 23:06     EKG: Independently reviewed.  Sinus rhythm, QTC 495, poor R-wave progression, T-wave inversion in lead 2/3, and the precordial leads   Assessment/Plan Principal Problem:   Acute on chronic respiratory failure with hypoxia ([name]) Active Problems:   Human immunodeficiency virus (HIV) disease ([place])   Diabetes mellitus type 2 with complications, uncontrolled ([place])   Dyslipidemia   Essential hypertension   Asthma exacerbation attacks   Diastolic dysfunction   Chronic kidney disease (CKD), stage IV (severe) ([name])   Hyperlipidemia   NSTEMI (non-ST elevated myocardial infarction) ([place])   Acute metabolic encephalopathy   Acute on chronic respiratory failure with hypoxia ([place]): Etiology is not clear. Differential diagnoses include asthma exacerbation (per ED physician, Dr. Lita Mains, patient had wheezing initially, but no wheezing when I examined the patient in ED), PE and NSTEMI given positive trop 0.55-->0.83. Pt dose not infiltration for pneumonia on chest x-ray.  -will admit to SDU as inpt -BiPAP -Nebulizers: Scheduled Atrovent and prn Xopenex nebs -will started doxycycline IV times (Patient received one dose of vancomycin and Zosyn in ED). -Mucinex for cough  -Urine pneumococcal antigen -Follow up blood culture x2, sputum culture, Flu pcr -pt was started IV heparin gtt for both NSTEMI and possible PE.  -will check d-dimer. If positive-->will do V/Q scan in AM.  NSTEMI: trop 0.35-->0.83. No CP. Possibly due to demand ischemia secondary to hypoxia. -On IV heparin -cycle CE q6 x3 and repeat EKG in the am  -Aspirin per rectal -hold oral crestor due to AMS. Will need to start as soon as mental status improves.  - Risk factor stratification: will  check FLP, UDS and A1C  - 2d echo  Possible sepsis vs. dehydration: Patient meets criteria for sepsis with tachycardia and elevated lactic acid. -will get Procalcitonin and trend lactic acid levels per sepsis protocol. -IVF: 2.5L of NS bolus in ED, followed by 75 cc/h-->will give another 500 cc bolus since her lactic acid is trending up to 2.7 in AM.  DM-II: Last A1c 10.3 on 03/05/16, poorly controled. Patient is taking Lantus, metformin, Victoza, Humulin at home -will decrease Lantus dose from 70 to 50 unit bid  -SSI  HLD: -hold home oral meds due to AMS  HTN: -hold oral meds due to AMS -IV hydralazine when necessary  Human immunodeficiency virus (HIV) disease ([place]): (CD4 80 on 04/04/16 and VL<20 on 05/20/15). There is no HIV on her med list. -need to clarify with pt when her mental status improves.  AoCKD-III: Baseline Cre is 1.1-122, pt's Cre is 1.9 on admission. Likely due to prerenal secondary to dehydration and continuation of diruetics - IVF as above - Check  FeUrea - Follow up renal function by BMP - Hold Diuretics, lasix  Chronic diastolic congestive heart failure: 2-D echo on 11/16/14 showed EF of 60-65% with grade 1 diastolic dysfunction. BNP is elevated at 520, but patient does not have leg edema or JVD. Chest x-ray has no pulmonary edema. Since patient has possible sepsis and worsening renal function, we'll hold diuretics. -Hold Lasix-->will need to restart Lasix as  soon lactic acid is normalized. -On aspirin per rectal  GERD: -IV pepcid  Depression and anxiety: Stable, no suicidal or homicidal ideations. -hold oral home medications due to AMS.  Acute metabolic encephalopathy: Likely multifactorial etiology, including hypoxia, non-STEMI, sepsis, worsening renal function -Treat underlying issues as above -Frequent neuro check   DVT ppx: IV Heparin     Code Status: Full code Family Communication: None at bed side.  Disposition Plan:  Anticipate discharge back to  previous home environment Consults called:  none Admission status: SDU/inpation       Date of Service 05/20/2016    Ivor Costa [facility] Pager [phone]  If 7PM-7AM, please contact night-coverage [email or web address] Password [email or web address] 05/20/2016, 3:30 AM

## 2016-05-19 NOTE — ED Triage Notes (Signed)
EMS called out for weakness. Pt found Room air sat at 60. 1 douneb given in route. Dx pna two weeks ago. Weakness for the two weeks

## 2016-05-20 ENCOUNTER — Other Ambulatory Visit (HOSPITAL_COMMUNITY): Payer: Medicare HMO

## 2016-05-20 ENCOUNTER — Inpatient Hospital Stay (HOSPITAL_COMMUNITY): Payer: Medicare HMO

## 2016-05-20 DIAGNOSIS — J9601 Acute respiratory failure with hypoxia: Secondary | ICD-10-CM

## 2016-05-20 DIAGNOSIS — I10 Essential (primary) hypertension: Secondary | ICD-10-CM

## 2016-05-20 DIAGNOSIS — J9621 Acute and chronic respiratory failure with hypoxia: Secondary | ICD-10-CM

## 2016-05-20 DIAGNOSIS — Z789 Other specified health status: Secondary | ICD-10-CM

## 2016-05-20 DIAGNOSIS — N184 Chronic kidney disease, stage 4 (severe): Secondary | ICD-10-CM

## 2016-05-20 LAB — D-DIMER, QUANTITATIVE (NOT AT ARMC): D DIMER QUANT: 0.58 ug{FEU}/mL — AB (ref 0.00–0.50)

## 2016-05-20 LAB — LACTIC ACID, PLASMA
LACTIC ACID, VENOUS: 3 mmol/L — AB (ref 0.5–1.9)
LACTIC ACID, VENOUS: 1.5 mmol/L (ref 0.5–1.9)
Lactic Acid, Venous: 2.2 mmol/L (ref 0.5–1.9)
Lactic Acid, Venous: 2.7 mmol/L (ref 0.5–1.9)

## 2016-05-20 LAB — CBG MONITORING, ED
GLUCOSE-CAPILLARY: 510 mg/dL — AB (ref 65–99)
GLUCOSE-CAPILLARY: 347 mg/dL — AB (ref 65–99)
Glucose-Capillary: 481 mg/dL — ABNORMAL HIGH (ref 65–99)
Glucose-Capillary: 418 mg/dL — ABNORMAL HIGH (ref 65–99)
Glucose-Capillary: 245 mg/dL — ABNORMAL HIGH (ref 65–99)

## 2016-05-20 LAB — COMPREHENSIVE METABOLIC PANEL
ALT: 17 U/L (ref 14–54)
ANION GAP: 14 (ref 5–15)
AST: 28 U/L (ref 15–41)
Albumin: 3.4 g/dL — ABNORMAL LOW (ref 3.5–5.0)
Alkaline Phosphatase: 80 U/L (ref 38–126)
BUN: 19 mg/dL (ref 6–20)
CO2: 23 mmol/L (ref 22–32)
Calcium: 9 mg/dL (ref 8.9–10.3)
Chloride: 98 mmol/L — ABNORMAL LOW (ref 101–111)
Creatinine, Ser: 2.23 mg/dL — ABNORMAL HIGH (ref 0.44–1.00)
GFR, EST AFRICAN AMERICAN: 28 mL/min — AB (ref >60)
GFR, EST NON AFRICAN AMERICAN: 24 mL/min — AB (ref >60)
Glucose, Bld: 567 mg/dL (ref 65–99)
POTASSIUM: 4.5 mmol/L (ref 3.5–5.1)
Sodium: 135 mmol/L (ref 135–145)
Total Bilirubin: 0.5 mg/dL (ref 0.3–1.2)
Total Protein: 7.5 g/dL (ref 6.5–8.1)

## 2016-05-20 LAB — I-STAT CG4 LACTIC ACID, ED
LACTIC ACID, VENOUS: 2.96 mmol/L — AB (ref 0.5–1.9)
LACTIC ACID, VENOUS: 3.14 mmol/L — AB (ref 0.5–1.9)

## 2016-05-20 LAB — TROPONIN I
Troponin I: 0.83 ng/mL (ref <0.03)
Troponin I: 1.21 ng/mL (ref <0.03)
Troponin I: 0.85 ng/mL (ref <0.03)

## 2016-05-20 LAB — LIPID PANEL
CHOL/HDL RATIO: 4.6 ratio
CHOLESTEROL: 144 mg/dL (ref 0–200)
HDL: 31 mg/dL — ABNORMAL LOW (ref >40)
LDL Cholesterol: 68 mg/dL (ref 0–99)
TRIGLYCERIDES: 225 mg/dL — AB (ref <150)
VLDL: 45 mg/dL — AB (ref 0–40)

## 2016-05-20 LAB — INFLUENZA PANEL BY PCR (TYPE A & B)
INFLBPCR: NEGATIVE
Influenza A By PCR: NEGATIVE

## 2016-05-20 LAB — PROTIME-INR
INR: 1.06
PROTHROMBIN TIME: 13.8 s (ref 11.4–15.2)

## 2016-05-20 LAB — HEPARIN LEVEL (UNFRACTIONATED)
HEPARIN UNFRACTIONATED: 0.26 [IU]/mL — AB (ref 0.30–0.70)
Heparin Unfractionated: 0.47 IU/mL (ref 0.30–0.70)

## 2016-05-20 LAB — STREP PNEUMONIAE URINARY ANTIGEN: Strep Pneumo Urinary Antigen: NEGATIVE

## 2016-05-20 LAB — PROCALCITONIN: Procalcitonin: <0.1 ng/mL

## 2016-05-20 LAB — CREATININE, URINE, RANDOM: Creatinine, Urine: 152.08 mg/dL

## 2016-05-20 LAB — MRSA PCR SCREENING: MRSA BY PCR: NEGATIVE

## 2016-05-20 LAB — GLUCOSE, CAPILLARY: GLUCOSE-CAPILLARY: 156 mg/dL — AB (ref 65–99)

## 2016-05-20 MED ORDER — HYDRALAZINE HCL 20 MG/ML IJ SOLN: 5.0000 mg | INTRAMUSCULAR | Status: DC | PRN

## 2016-05-20 MED ORDER — INSULIN ASPART 100 UNIT/ML ~~LOC~~ SOLN
0.0000 [IU] | Freq: Three times a day (TID) | SUBCUTANEOUS | Status: DC
  Administered 2016-05-20: 2 [IU] via SUBCUTANEOUS

## 2016-05-20 MED ORDER — INSULIN GLARGINE 100 UNIT/ML ~~LOC~~ SOLN
50.0000 [IU] | Freq: Two times a day (BID) | SUBCUTANEOUS | Status: DC
  Administered 2016-05-20 – 2016-05-25 (×9): 50 [IU] via SUBCUTANEOUS
  Filled 2016-05-20 (×14): qty 0.5

## 2016-05-20 MED ORDER — ACETAMINOPHEN 325 MG PO TABS
650.0000 mg | ORAL_TABLET | Freq: Four times a day (QID) | ORAL | Status: DC | PRN
  Administered 2016-05-20 – 2016-05-24 (×5): 650 mg via ORAL
  Filled 2016-05-20 (×5): qty 2

## 2016-05-20 MED ORDER — FAMOTIDINE IN NACL 20-0.9 MG/50ML-% IV SOLN
20.0000 mg | Freq: Two times a day (BID) | INTRAVENOUS | Status: DC
  Administered 2016-05-20 – 2016-05-22 (×6): 20 mg via INTRAVENOUS
  Filled 2016-05-20 (×6): qty 50

## 2016-05-20 MED ORDER — HEPARIN BOLUS VIA INFUSION
4000.0000 [IU] | Freq: Once | INTRAVENOUS | Status: AC
  Administered 2016-05-20: 4000 [IU] via INTRAVENOUS
  Filled 2016-05-20: qty 4000

## 2016-05-20 MED ORDER — LEVALBUTEROL HCL 1.25 MG/0.5ML IN NEBU
1.2500 mg | INHALATION_SOLUTION | Freq: Four times a day (QID) | RESPIRATORY_TRACT | Status: DC
  Administered 2016-05-20 (×4): 1.25 mg via RESPIRATORY_TRACT
  Filled 2016-05-20 (×6): qty 0.5

## 2016-05-20 MED ORDER — IPRATROPIUM BROMIDE 0.02 % IN SOLN
0.5000 mg | Freq: Four times a day (QID) | RESPIRATORY_TRACT | Status: DC
  Administered 2016-05-21 (×4): 0.5 mg via RESPIRATORY_TRACT
  Filled 2016-05-20 (×4): qty 2.5

## 2016-05-20 MED ORDER — ONDANSETRON HCL 4 MG/2ML IJ SOLN
4.0000 mg | Freq: Three times a day (TID) | INTRAMUSCULAR | Status: DC | PRN
  Administered 2016-05-21 – 2016-05-24 (×2): 4 mg via INTRAVENOUS
  Filled 2016-05-20 (×2): qty 2

## 2016-05-20 MED ORDER — NICOTINE 21 MG/24HR TD PT24
21.0000 mg | MEDICATED_PATCH | Freq: Every day | TRANSDERMAL | Status: DC
  Administered 2016-05-20 – 2016-05-25 (×6): 21 mg via TRANSDERMAL
  Filled 2016-05-20 (×6): qty 1

## 2016-05-20 MED ORDER — LEVALBUTEROL HCL 1.25 MG/0.5ML IN NEBU
1.2500 mg | INHALATION_SOLUTION | Freq: Four times a day (QID) | RESPIRATORY_TRACT | Status: DC
  Administered 2016-05-21 (×4): 1.25 mg via RESPIRATORY_TRACT
  Filled 2016-05-20 (×4): qty 0.5

## 2016-05-20 MED ORDER — HEPARIN (PORCINE) IN NACL 100-0.45 UNIT/ML-% IJ SOLN
1300.0000 [IU]/h | INTRAMUSCULAR | Status: DC
  Administered 2016-05-20: 1300 [IU]/h via INTRAVENOUS
  Filled 2016-05-20 (×2): qty 250

## 2016-05-20 MED ORDER — HEPARIN (PORCINE) IN NACL 100-0.45 UNIT/ML-% IJ SOLN
1400.0000 [IU]/h | INTRAMUSCULAR | Status: DC
Start: 2016-05-20 — End: 2016-05-24
  Administered 2016-05-20 – 2016-05-21 (×3): 1450 [IU]/h via INTRAVENOUS
  Administered 2016-05-22 – 2016-05-24 (×3): 1400 [IU]/h via INTRAVENOUS
  Filled 2016-05-20 (×5): qty 250

## 2016-05-20 MED ORDER — IPRATROPIUM BROMIDE 0.02 % IN SOLN
0.5000 mg | Freq: Four times a day (QID) | RESPIRATORY_TRACT | Status: DC
  Administered 2016-05-20 (×2): 0.5 mg via RESPIRATORY_TRACT
  Filled 2016-05-20 (×2): qty 2.5

## 2016-05-20 MED ORDER — ASPIRIN 300 MG RE SUPP
300.0000 mg | Freq: Every day | RECTAL | Status: DC
  Administered 2016-05-20 – 2016-05-21 (×2): 300 mg via RECTAL
  Filled 2016-05-20 (×3): qty 1

## 2016-05-20 MED ORDER — INSULIN ASPART 100 UNIT/ML ~~LOC~~ SOLN
10.0000 [IU] | Freq: Once | SUBCUTANEOUS | Status: AC
Start: 2016-05-20 — End: 2016-05-20
  Administered 2016-05-20: 10 [IU] via SUBCUTANEOUS
  Filled 2016-05-20: qty 1

## 2016-05-20 MED ORDER — INSULIN ASPART 100 UNIT/ML ~~LOC~~ SOLN
15.0000 [IU] | Freq: Once | SUBCUTANEOUS | Status: AC
  Administered 2016-05-20: 15 [IU] via SUBCUTANEOUS
  Filled 2016-05-20: qty 1

## 2016-05-20 MED ORDER — HEPARIN (PORCINE) IN NACL 100-0.45 UNIT/ML-% IJ SOLN: 1450.0000 [IU]/h | INTRAMUSCULAR | Status: DC

## 2016-05-20 MED ORDER — DOXYCYCLINE HYCLATE 100 MG IV SOLR
100.0000 mg | Freq: Two times a day (BID) | INTRAVENOUS | Status: DC
  Administered 2016-05-20 – 2016-05-22 (×5): 100 mg via INTRAVENOUS
  Filled 2016-05-20 (×6): qty 100

## 2016-05-20 MED ORDER — SODIUM CHLORIDE 0.9 % IV SOLN
INTRAVENOUS | Status: DC
  Administered 2016-05-20 – 2016-05-23 (×3): via INTRAVENOUS

## 2016-05-20 MED ORDER — IPRATROPIUM BROMIDE 0.02 % IN SOLN
0.5000 mg | RESPIRATORY_TRACT | Status: DC
  Administered 2016-05-20: 0.5 mg via RESPIRATORY_TRACT
  Filled 2016-05-20 (×2): qty 2.5

## 2016-05-20 MED ORDER — SODIUM CHLORIDE 0.9 % IV BOLUS (SEPSIS)
500.0000 mL | Freq: Once | INTRAVENOUS | Status: AC
  Administered 2016-05-20: 500 mL via INTRAVENOUS

## 2016-05-20 MED ORDER — SODIUM CHLORIDE 0.9 % IV BOLUS (SEPSIS): 2000.0000 mL | Freq: Once | INTRAVENOUS | Status: DC

## 2016-05-20 MED ORDER — BUDESONIDE 0.5 MG/2ML IN SUSP
0.5000 mg | Freq: Two times a day (BID) | RESPIRATORY_TRACT | Status: DC
  Administered 2016-05-20 – 2016-05-25 (×11): 0.5 mg via RESPIRATORY_TRACT
  Filled 2016-05-20 (×13): qty 2

## 2016-05-20 MED ORDER — WHITE PETROLATUM GEL
Status: AC
  Filled 2016-05-20: qty 1

## 2016-05-20 MED ORDER — IPRATROPIUM BROMIDE 0.02 % IN SOLN
0.5000 mg | RESPIRATORY_TRACT | Status: DC
  Administered 2016-05-20 (×2): 0.5 mg via RESPIRATORY_TRACT
  Filled 2016-05-20 (×2): qty 2.5

## 2016-05-20 MED ORDER — SODIUM CHLORIDE 0.9 % IV BOLUS (SEPSIS)
1500.0000 mL | Freq: Once | INTRAVENOUS | Status: AC
  Administered 2016-05-20: 1500 mL via INTRAVENOUS

## 2016-05-20 NOTE — ED Notes (Signed)
Notified Dr Dyann Kief of need for different IV access.

## 2016-05-20 NOTE — Progress Notes (Addendum)
[facility] PROGRESS NOTE  [name] WLS:[id number] DOB: 03-07-1965 DOA: 05/19/2016 PCP: Michel Harrow, PA-C  Interim summary and HPI 51 year old female with medical history significant for hypertension, HIV, diabetes, asthma/COPD, GERD, depression, tobacco abuse, chronic kidney disease stage 3-4 and left BKA; who presented to the hospital secondary to acute respiratory failure with hypoxia and worsening wheezing. Patient with generalized weakness and altered mental status; found by EMS with oxygen saturation around 65-70% only oriented to person. X-ray without infiltrates; patient was afebrile and with normal WBCs. Of note she was about 5 weeks ago admitted to [facility] secondary to community acquired pneumonia and influenza.  Assessment/Plan: 1-acute respiratory failure with hypoxia: Appears to be secondary to asthma exacerbation/bronchiectasis and bronchitis. -No infiltrates appreciated on x-ray, normal WBCs and the patient is afebrile -Will start Solu Medrol, continue nebulizer treatment, initiates the use of Pulmicort, continue doxycycline empirically for bronchitis and/bronchiectasis and continue providing oxygen supplementation and support. -Patient requiring BiPAP at this moment, will wean as tolerated -Started on flutter valve -Cultures has been taking will follow results. -Influenza PCR negative; Procalcitonin < 0.10  2-SIRS: Due to asthma exacerbation/presumed bronchiectasis -Patient with elevated lactic acid due to respiratory failure -No sepsis appreciated otherwise -Continue supportive care and treatment as mentioned above.  3-HIV -Will repeat daily for GERD -will resume home HAART regimen -will involve ID into the case base on results  4-elevated troponin and elevated d-dimer level: -no CP, no acute ischemic changes -concerns for potential ACS -will follow 2-D echo, depending results might need cardiology consult -currently receiving heparin   -V/Q scan pending  5-uncontrolled type 2 diabetes mellitus: On chronic insulin and with nephropathy. -Since the patient is currently nothing by mouth while using BiPAP; Will continue reduced dose of Lantus and sliding scale insulin -We will hold oral hypoglycemic regimen. -Follow A1c  6-acute on chronic renal failure: Stage III at baseline -Prerenal in nature -Will minimize/avoid the use of nephrotoxic agents -Continue IV fluids and follow renal function trend  7-obesity -Body mass index is 35.87 kg/m. -Low calorie diet has been discussed with patient  Code Status: Full code Family Communication: no family at bedside Disposition Plan: Continue stepdown bed, continue BiPAP, continue nebulizer treatment and current empiric antibiotic for bronchitis/bronchiectasis.    Consultants:  None  Procedures:  See below for x-ray report  Antibiotics:  Doxycycline 05/20/16  HPI/Subjective: Patient with positive tachypnea, currently afebrile, lethargic (but appropriate and easily arouse); denying chest pain. Unable to speak in full sentences and requiring oxygen supplementation (BIPAP, to maintain good O2 sat)  Objective: Vitals:   05/20/16 1100 05/20/16 1139  BP:    Pulse: 95 97  Resp: 19 15  Temp:      Intake/Output Summary (Last 24 hours) at 05/20/16 1228 Last data filed at 05/20/16 0851  Gross per 24 hour  Intake             3000 ml  Output                0 ml  Net             3000 ml   Filed Weights   05/20/16 0000  Weight: 113.4 kg (250 lb)    Exam:   General:  Afebrile, feeling slightly lethargic but able to answer questions appropriately now. Denies chest pain. Patient was seen with mild respiratory distress, actively wheezing and with decrease oxygen saturation to 83-87% on 4 L.   Cardiovascular: No rubs, no gallops, no  murmurs; unable to assess for JVD due to body habitus.   Respiratory: Diffuse wheezing, scattered rhonchi, positive tachypnea and mild use  of accessory muscles. Patient was unable to speak in full sentences.   Abdomen: Soft, nontender, positive bowel sounds   Musculoskeletal: Left BKA, no edema of her right lower extremity, no cyanosis or clubbing.  Data Reviewed: Basic Metabolic Panel:  Recent Labs Lab 05/19/16 2217 05/19/16 2243  NA 135 138  K 4.5 4.5  CL 98* 98*  CO2 23  --   GLUCOSE 567* 563*  BUN 19 26*  CREATININE 2.23* 1.90*  CALCIUM 9.0  --    Liver Function Tests:  Recent Labs Lab 05/19/16 2217  AST 28  ALT 17  ALKPHOS 80  BILITOT 0.5  PROT 7.5  ALBUMIN 3.4*   CBC:  Recent Labs Lab 05/19/16 2217 05/19/16 2243  WBC 9.6  --   NEUTROABS 8.0*  --   HGB 13.7 16.3*  HCT 45.8 48.0*  MCV 91.6  --   PLT 340  --    Cardiac Enzymes:  Recent Labs Lab 05/20/16 0049 05/20/16 0647  TROPONINI 0.83* 1.21*   BNP (last 3 results)  Recent Labs  05/19/16 2217  BNP 520.0*   CBG:  Recent Labs Lab 05/19/16 2215 05/20/16 0207 05/20/16 0522 05/20/16 0838  GLUCAP 580* 510* 481* 418*    Recent Results (from the past 240 hour(s))  Culture, blood (Routine X 2) w Reflex to ID Panel     Status: None (Preliminary result)   Collection Time: 05/19/16 10:17 PM  Result Value Ref Range Status   Specimen Description BLOOD RIGHT ANTECUBITAL  Final   Special Requests   Final    BOTTLES DRAWN AEROBIC AND ANAEROBIC Blood Culture adequate volume   Culture PENDING  Incomplete   Report Status PENDING  Incomplete  Culture, blood (Routine X 2) w Reflex to ID Panel     Status: None (Preliminary result)   Collection Time: 05/19/16 10:17 PM  Result Value Ref Range Status   Specimen Description BLOOD LEFT ANTECUBITAL  Final   Special Requests   Final    BOTTLES DRAWN AEROBIC AND ANAEROBIC Blood Culture adequate volume   Culture PENDING  Incomplete   Report Status PENDING  Incomplete     Studies: Ct Head Wo Contrast  Result Date: 05/20/2016 CLINICAL DATA:  Acute encephalopathy, weakness for 2 weeks  EXAM: CT HEAD WITHOUT CONTRAST TECHNIQUE: Contiguous axial images were obtained from the base of the skull through the vertex without intravenous contrast. COMPARISON:  None. FINDINGS: Brain: Mild superficial and central atrophy with minimal small vessel ischemic disease of periventricular white matter. No intra-axial mass, hemorrhage or midline. No extra-axial fluid collections. Vascular: Mild atherosclerosis of the carotid siphons no hyperdense vessels. Skull: Negative for fracture or focal lesion. Sinuses/Orbits: The visualized mastoids, paranasal sinuses, orbits and globes are unremarkable. Other: None. IMPRESSION: Mild atrophy with minimal small vessel ischemic disease of periventricular white matter. No acute intracranial appearing abnormality. Electronically Signed   By: Ashley Royalty M.D.   On: 05/20/2016 01:50   Dg Chest Port 1 View  Result Date: 05/19/2016 CLINICAL DATA:  Weakness EXAM: PORTABLE CHEST 1 VIEW COMPARISON:  04/25/2016 FINDINGS: The heart size and mediastinal contours are within normal limits. Both lungs are clear. Chronic bilateral healed sixth and seventh rib fractures. IMPRESSION: No active disease. Chronic bilateral sixth and seventh rib fractures. Electronically Signed   By: Ashley Royalty M.D.   On: 05/19/2016 23:06  Scheduled Meds: . aspirin  300 mg Rectal Daily  . budesonide (PULMICORT) nebulizer solution  0.5 mg Nebulization BID  . insulin aspart  0-9 Units Subcutaneous TID WC  . insulin glargine  50 Units Subcutaneous BID  . ipratropium  0.5 mg Nebulization Q6H  . levalbuterol  1.25 mg Nebulization Q6H  . nicotine  21 mg Transdermal Daily   Continuous Infusions: . sodium chloride 100 mL/hr at 05/20/16 1045  . doxycycline (VIBRAMYCIN) IV Stopped (05/20/16 0235)  . famotidine (PEPCID) IV Stopped (05/20/16 0234)  . heparin 1,300 Units/hr (05/20/16 1045)    Principal Problem:   Acute on chronic respiratory failure with hypoxia ([name]) Active Problems:   Human  immunodeficiency virus (HIV) disease ([place])   Diabetes mellitus type 2 with complications, uncontrolled ([place])   Dyslipidemia   Essential hypertension   Asthma exacerbation attacks   Diastolic dysfunction   Chronic kidney disease (CKD), stage IV (severe) ([name])   Hyperlipidemia   NSTEMI (non-ST elevated myocardial infarction) ([place])   Acute metabolic encephalopathy    Time spent: 30 minutes    Barton Dubois  [facility] Pager [phone]. If 7PM-7AM, please contact night-coverage at [email or web address], password [facility] 05/20/2016, 12:28 PM  LOS: 1 day

## 2016-05-20 NOTE — ED Notes (Signed)
At pt bedside pt states seeing rates in room. Informed pt that there are no rats in room. Pt laughs.

## 2016-05-20 NOTE — ED Notes (Signed)
Niu MD text paged about critical labs

## 2016-05-20 NOTE — Procedures (Signed)
Central Venous Catheter Insertion Procedure Note [name] [id number] April 25, 1965  Procedure: Insertion of Central Venous Catheter Indications: Drug and/or fluid administration  Procedure Details Consent: Risks of procedure as well as the alternatives and risks of each were explained to the (patient/caregiver).  Consent for procedure obtained. Time Out: Verified patient identification, verified procedure, site/side was marked, verified correct patient position, special equipment/implants available, medications/allergies/relevent history reviewed, required imaging and test results available.  Performed Heparin held X 45 minutes prior US guided: real time [place] used to ID and cannulate the right IJV Maximum sterile technique was used including antiseptics, cap, gloves, gown, hand hygiene, mask and sheet. Skin prep: Chlorhexidine; local anesthetic administered A antimicrobial bonded/coated triple lumen catheter was placed in the right internal jugular vein using the Seldinger technique.  Evaluation Blood flow good Complications: No apparent complications Patient did tolerate procedure well. Chest X-ray ordered to verify placement.  CXR: pending.  [name] 05/20/2016, 5:08 PM  Erick Colace ACNP-BC [place] Pager # [phone] OR # [phone] if no answer

## 2016-05-20 NOTE — ED Notes (Addendum)
Notified Dr Dyann Kief of pt's increasing troponin of 1.21 and CBG of 418.  He reports he will be by to see her shortly.

## 2016-05-20 NOTE — Progress Notes (Signed)
ANTICOAGULATION CONSULT NOTE - Follow Up Consult  Pharmacy Consult:  Heparin Indication: chest pain/ACS  Allergies  Allergen Reactions  . Cortizone-10 [Hydrocortisone] Rash    Per patient, given local injection at knee and developed rash at local site.     Patient Measurements: Height: 5\' 10"  (177.8 cm) (from March 2018 records) Weight: 250 lb (113.4 kg) (from March 2018 records) IBW/kg (Calculated) : 68.5 Heparin Dosing Weight: 95 kg  Vital Signs: Temp: 99 F (37.2 C) (05/20 1233) Temp Source: Rectal (05/20 1233) BP: 104/63 (05/20 1500) Pulse Rate: 103 (05/20 1500)  Labs:  Recent Labs  05/19/16 2217 05/19/16 2243 05/20/16 0049 05/20/16 0647 05/20/16 0756 05/20/16 1206 05/20/16 1533  HGB 13.7 16.3*  --   --   --   --   --   HCT 45.8 48.0*  --   --   --   --   --   PLT 340  --   --   --   --   --   --   LABPROT  --   --  13.8  --   --   --   --   INR  --   --  1.06  --   --   --   --   HEPARINUNFRC  --   --   --   --  0.47  --  0.26*  CREATININE 2.23* 1.90*  --   --   --   --   --   TROPONINI  --   --  0.83* 1.21*  --  0.85*  --     Estimated Creatinine Clearance: 47.3 mL/min (A) (by C-G formula based on SCr of 1.9 mg/dL (H)).    Assessment: 31 YOF to continue on IV heparin for ACS.  Heparin level is therapeutic; no bleeding reported.  Repeat heparin level is now slightly subtherapeutic at 0.26 on heparin 1300 units/hr. Nurse reports no issues with infusion or bleeding.   Goal of Therapy:  Heparin level 0.3-0.7 units/ml Monitor platelets by anticoagulation protocol: Yes   Plan:  Increase heparin to 1450 units/hr 6h HL Daily heparin level and CBC  Andrey Cota. Diona Foley, PharmD, BCPS Clinical Pharmacist [phone] 05/20/2016, 4:09 PM

## 2016-05-20 NOTE — ED Notes (Signed)
Confirmed per pharmacist, priority medication with single line IV access is heparin.  Holding doxycycline until further access established.

## 2016-05-20 NOTE — ED Notes (Signed)
Dr Madera at bedside  

## 2016-05-20 NOTE — Progress Notes (Signed)
ANTICOAGULATION CONSULT NOTE - Initial Consult  Pharmacy Consult for heparin Indication: chest pain/ACS  Allergies  Allergen Reactions  . Cortizone-10 [Hydrocortisone] Rash    Per patient, given local injection at knee and developed rash at local site.     Patient Measurements: Height: 5\' 10"  (177.8 cm) (from March 2018 records) Weight: 250 lb (113.4 kg) (from March 2018 records) IBW/kg (Calculated) : 68.5 Heparin Dosing Weight: 95kg  Vital Signs: Temp: 98.6 F (37 C) (05/19 2210) Temp Source: Rectal (05/19 2210) BP: 113/47 (05/20 0000) Pulse Rate: 105 (05/20 0000)  Labs:  Recent Labs  05/19/16 2217 05/19/16 2243  HGB 13.7 16.3*  HCT 45.8 48.0*  PLT 340  --   CREATININE  --  1.90*    Estimated Creatinine Clearance: 47.3 mL/min (A) (by C-G formula based on SCr of 1.9 mg/dL (H)).   Medical History: Past Medical History:  Diagnosis Date  . Anxiety   . Arthritis    LEFT HIP  . Asthma    HOSPITALIZED WITH EXCERBATION OF ASTHMA - AND BRONCHITIS AND THE FLU DEC 2013  . Depression   . Diabetes mellitus    ON INSULIN AND ORAL MEDICATIONS  . GERD (gastroesophageal reflux disease)   . HIV infection ([place]) 2000  . Hypertension   . Neuropathy    NEUROPATHY HANDS AND FEET  . Pain    SEVERE PAIN LEFT HIP - HX OF LEFT HIP PINNING JULY 2013  . Shortness of breath    ALLERGIES ARE "ACTING UP"     Assessment: 51yo female c/o weakness x2wk, had PNA dx 2d ago, CBG found to be 580, initial istat troponin elevated, to begin heparin.  Goal of Therapy:  Heparin level 0.3-0.7 units/ml Monitor platelets by anticoagulation protocol: Yes   Plan:  Will give heparin 4000 units IV bolus x1 followed by gtt at 1300 units/hr and monitor heparin levels and CBC.  Wynona Neat, PharmD, BCPS  05/20/2016,12:23 AM

## 2016-05-20 NOTE — Progress Notes (Signed)
Tried to give patient a break from BIPAP and placed on 4L [place]. WOB increased and sats dropped to 85% within 5-10 minutes. Pt back on BIPAP now. Sats 94%. Will cont to monitor

## 2016-05-20 NOTE — Progress Notes (Signed)
eLink Physician-Brief Progress Note Patient Name: [name] DOB: 08-07-1965 MRN: [id number]   Date of Service  05/20/2016  HPI/Events of Note  Called to review CXR for R IJ CVL placement. R IJ CVL tip is in the distal SVC. No pneumothorax.  eICU Interventions  OK to use R IJ CVL.     Intervention Category Intermediate Interventions: Diagnostic test evaluation  Carmela Piechowski [place] 05/20/2016, 8:30 PM

## 2016-05-20 NOTE — Progress Notes (Signed)
ANTICOAGULATION CONSULT NOTE - Follow Up Consult  Pharmacy Consult:  Heparin Indication: chest pain/ACS  Allergies  Allergen Reactions  . Cortizone-10 [Hydrocortisone] Rash    Per patient, given local injection at knee and developed rash at local site.     Patient Measurements: Height: 5\' 10"  (177.8 cm) (from March 2018 records) Weight: 250 lb (113.4 kg) (from March 2018 records) IBW/kg (Calculated) : 68.5 Heparin Dosing Weight: 95 kg  Vital Signs: Temp: 99 F (37.2 C) (05/20 1233) Temp Source: Rectal (05/20 1233) BP: 126/67 (05/20 1334) Pulse Rate: 103 (05/20 1334)  Labs:  Recent Labs  05/19/16 2217 05/19/16 2243 05/20/16 0049 05/20/16 0647 05/20/16 0756 05/20/16 1206  HGB 13.7 16.3*  --   --   --   --   HCT 45.8 48.0*  --   --   --   --   PLT 340  --   --   --   --   --   LABPROT  --   --  13.8  --   --   --   INR  --   --  1.06  --   --   --   HEPARINUNFRC  --   --   --   --  0.47  --   CREATININE 2.23* 1.90*  --   --   --   --   TROPONINI  --   --  0.83* 1.21*  --  0.85*    Estimated Creatinine Clearance: 47.3 mL/min (A) (by C-G formula based on SCr of 1.9 mg/dL (H)).    Assessment: 46 YOF to continue on IV heparin for ACS.  Heparin level is therapeutic; no bleeding reported.   Goal of Therapy:  Heparin level 0.3-0.7 units/ml Monitor platelets by anticoagulation protocol: Yes    Plan:  - Continue heparin gtt at 1300 units/hr - Check confirmatory heparin level - Daily heparin level and CBC - F/U CBGs, med hx    Estle Sabella D. Mina Marble, PharmD, BCPS Pager:  [phone] 05/20/2016, 2:05 PM

## 2016-05-20 NOTE — Progress Notes (Signed)
Pt remains off BIPAP at this time. Sats 93% on 6L [place]. Pt is showing some signs of confusion, talking about random things like "telling people to go upstairs and be quiet" and so forth. Pt does this intermittently, looks perfectly awake and oriented, then the next minute talking confused and eyes looking gloomy.

## 2016-05-20 NOTE — ED Notes (Signed)
Pt continuing to request water to drink.  Advised pt that she had a few sips of water not that long ago when the bi-pap was removed and that I would be happy to give her some more sips in about half an hour.  Pt asked "where is this place located?"  This RN advised pt that she was in [place] at [facility] in the emergency department and that she would go upstairs as soon as a room was available.  Pt replied "That's where you are? Oh ok.  I've never seen that before."  Pt remains confused, occasionally pt appears more lucid that at other times.

## 2016-05-20 NOTE — ED Notes (Signed)
Pt's trial off bi-pap lasted approx 10 minutes before being placed back on.

## 2016-05-20 NOTE — ED Notes (Signed)
Checked pt CBG 481, RN Tori informed

## 2016-05-20 NOTE — ED Notes (Signed)
Niu MD made aware of critical labs. Per Dr. Blaine Hamper give 10units Novolog IV and 500 bolus NS.

## 2016-05-20 NOTE — Progress Notes (Signed)
Pt taken off BIPAP for break. Placed on 6L [place]. Pt seems to be tolerating better than before. Sats 93%, RR 20. Will cont to monitor

## 2016-05-21 ENCOUNTER — Inpatient Hospital Stay (HOSPITAL_COMMUNITY): Payer: Medicare HMO

## 2016-05-21 DIAGNOSIS — N179 Acute kidney failure, unspecified: Secondary | ICD-10-CM

## 2016-05-21 DIAGNOSIS — R748 Abnormal levels of other serum enzymes: Secondary | ICD-10-CM

## 2016-05-21 DIAGNOSIS — G9340 Encephalopathy, unspecified: Secondary | ICD-10-CM

## 2016-05-21 DIAGNOSIS — R0602 Shortness of breath: Secondary | ICD-10-CM

## 2016-05-21 DIAGNOSIS — N183 Chronic kidney disease, stage 3 (moderate): Secondary | ICD-10-CM

## 2016-05-21 DIAGNOSIS — Z789 Other specified health status: Secondary | ICD-10-CM

## 2016-05-21 LAB — CBC
HCT: 36.5 % (ref 36.0–46.0)
Hemoglobin: 10.5 g/dL — ABNORMAL LOW (ref 12.0–15.0)
MCH: 25.8 pg — AB (ref 26.0–34.0)
MCHC: 28.8 g/dL — ABNORMAL LOW (ref 30.0–36.0)
MCV: 89.7 fL (ref 78.0–100.0)
PLATELETS: 317 10*3/uL (ref 150–400)
RBC: 4.07 MIL/uL (ref 3.87–5.11)
RDW: 17.7 % — ABNORMAL HIGH (ref 11.5–15.5)
WBC: 8.6 10*3/uL (ref 4.0–10.5)

## 2016-05-21 LAB — BASIC METABOLIC PANEL
ANION GAP: 9 (ref 5–15)
BUN: 9 mg/dL (ref 6–20)
CO2: 26 mmol/L (ref 22–32)
Calcium: 8.4 mg/dL — ABNORMAL LOW (ref 8.9–10.3)
Chloride: 106 mmol/L (ref 101–111)
Creatinine, Ser: 1.07 mg/dL — ABNORMAL HIGH (ref 0.44–1.00)
GFR, EST NON AFRICAN AMERICAN: 59 mL/min — AB (ref >60)
Glucose, Bld: 132 mg/dL — ABNORMAL HIGH (ref 65–99)
POTASSIUM: 2.8 mmol/L — AB (ref 3.5–5.1)
SODIUM: 141 mmol/L (ref 135–145)

## 2016-05-21 LAB — HEMOGLOBIN A1C
Hgb A1c MFr Bld: 9.8 % — ABNORMAL HIGH (ref 4.8–5.6)
MEAN PLASMA GLUCOSE: 235 mg/dL

## 2016-05-21 LAB — HEPARIN LEVEL (UNFRACTIONATED): Heparin Unfractionated: 0.53 IU/mL (ref 0.30–0.70)

## 2016-05-21 LAB — GLUCOSE, CAPILLARY
GLUCOSE-CAPILLARY: 176 mg/dL — AB (ref 65–99)
GLUCOSE-CAPILLARY: 109 mg/dL — AB (ref 65–99)
GLUCOSE-CAPILLARY: 319 mg/dL — AB (ref 65–99)
GLUCOSE-CAPILLARY: 273 mg/dL — AB (ref 65–99)
Glucose-Capillary: 202 mg/dL — ABNORMAL HIGH (ref 65–99)

## 2016-05-21 LAB — T-HELPER CELLS (CD4) COUNT (NOT AT ARMC)
CD4 % Helper T Cell: 34 % (ref 33–55)
CD4 T Cell Abs: 620 /uL (ref 400–2700)

## 2016-05-21 LAB — UREA NITROGEN, URINE: Urea Nitrogen, Ur: 348 mg/dL

## 2016-05-21 MED ORDER — POTASSIUM CHLORIDE 10 MEQ/50ML IV SOLN
10.0000 meq | INTRAVENOUS | Status: AC
  Administered 2016-05-21 (×3): 10 meq via INTRAVENOUS
  Filled 2016-05-21: qty 50

## 2016-05-21 MED ORDER — LORAZEPAM 2 MG/ML IJ SOLN
0.5000 mg | Freq: Two times a day (BID) | INTRAMUSCULAR | Status: DC | PRN
  Administered 2016-05-21 – 2016-05-24 (×5): 0.5 mg via INTRAVENOUS
  Filled 2016-05-21 (×5): qty 1

## 2016-05-21 MED ORDER — ELVITEG-COBIC-EMTRICIT-TENOFAF 150-150-200-10 MG PO TABS
1.0000 | ORAL_TABLET | Freq: Every day | ORAL | Status: DC
  Administered 2016-05-22 – 2016-05-25 (×4): 1 via ORAL
  Filled 2016-05-21 (×5): qty 1

## 2016-05-21 MED ORDER — ROSUVASTATIN CALCIUM 20 MG PO TABS
20.0000 mg | ORAL_TABLET | Freq: Every day | ORAL | Status: DC
  Administered 2016-05-21 – 2016-05-25 (×4): 20 mg via ORAL
  Filled 2016-05-21 (×5): qty 1

## 2016-05-21 MED ORDER — HALOPERIDOL LACTATE 5 MG/ML IJ SOLN
0.5000 mg | Freq: Four times a day (QID) | INTRAMUSCULAR | Status: DC | PRN
Start: 2016-05-21 — End: 2016-05-25
  Administered 2016-05-21 – 2016-05-22 (×4): 0.5 mg via INTRAVENOUS
  Filled 2016-05-21 (×4): qty 1

## 2016-05-21 MED ORDER — INSULIN ASPART 100 UNIT/ML ~~LOC~~ SOLN
0.0000 [IU] | Freq: Three times a day (TID) | SUBCUTANEOUS | Status: DC
  Administered 2016-05-21: 7 [IU] via SUBCUTANEOUS
  Administered 2016-05-21: 15 [IU] via SUBCUTANEOUS
  Administered 2016-05-22: 4 [IU] via SUBCUTANEOUS
  Administered 2016-05-22 – 2016-05-23 (×2): 3 [IU] via SUBCUTANEOUS
  Administered 2016-05-23: 4 [IU] via SUBCUTANEOUS
  Administered 2016-05-24: 11 [IU] via SUBCUTANEOUS
  Administered 2016-05-24: 15 [IU] via SUBCUTANEOUS
  Administered 2016-05-24: 4 [IU] via SUBCUTANEOUS
  Administered 2016-05-25: 15 [IU] via SUBCUTANEOUS
  Administered 2016-05-25: 7 [IU] via SUBCUTANEOUS

## 2016-05-21 MED ORDER — INSULIN ASPART 100 UNIT/ML ~~LOC~~ SOLN
0.0000 [IU] | Freq: Every day | SUBCUTANEOUS | Status: DC
  Administered 2016-05-21: 2 [IU] via SUBCUTANEOUS

## 2016-05-21 MED ORDER — PREDNISONE 20 MG PO TABS
40.0000 mg | ORAL_TABLET | Freq: Two times a day (BID) | ORAL | Status: DC
  Administered 2016-05-21 – 2016-05-23 (×5): 40 mg via ORAL
  Filled 2016-05-21 (×6): qty 2

## 2016-05-21 NOTE — Progress Notes (Signed)
Inpatient Diabetes Program Recommendations  AACE/ADA: New Consensus Statement on Inpatient Glycemic Control (2015)  Target Ranges:  Prepandial:   less than 140 mg/dL      Peak postprandial:   less than 180 mg/dL (1-2 hours)      Critically ill patients:  140 - 180 mg/dL  Results for [name], [name] (MRN [id number]) as of 05/21/2016 14:21  Ref. Range 05/20/2016 05:22 05/20/2016 08:38 05/20/2016 12:28 05/20/2016 14:08 05/20/2016 18:33 05/20/2016 21:08 05/21/2016 08:46 05/21/2016 12:49  Glucose-Capillary Latest Ref Range: 65 - 99 mg/dL 481 (H) 418 (H) 347 (H) 245 (H) 176 (H) 156 (H) 109 (H) 319 (H)   Results for [name], [name] (MRN [id number]) as of 05/21/2016 14:21  Ref. Range 03/05/2016 13:47 05/20/2016 06:47  Hemoglobin A1C Latest Ref Range: 4.8 - 5.6 % 10.3 9.8 (H)   Review of Glycemic Control  Diabetes history: DM2 Outpatient Diabetes medications: Humulin R U500 (concentrated insulin) 60 units with breakfast, 50 units with lunch, and 35 units with supper Current orders for Inpatient glycemic control: Lantus 50 units BID, Novolog 0-20 units TID with meals, Novolog 0-5 units QHS  Inpatient Diabetes Program Recommendations: Insulin - Meal Coverage: Please consider ordering Novolog 5 units TID with meals for meal coverage if patient eats at least 50% of meals.  Thanks, Barnie Alderman, RN, MSN, CDE Diabetes Coordinator Inpatient Diabetes Program [phone] (Team Pager from 8am to 5pm)

## 2016-05-21 NOTE — Progress Notes (Addendum)
[facility] PROGRESS NOTE  [name] OAC:[id number] DOB: 1965-04-09 DOA: 05/19/2016 PCP: Michel Harrow, PA-C  Interim summary and HPI 51 year old female with medical history significant for hypertension, HIV, diabetes, asthma/COPD, GERD, depression, tobacco abuse, chronic kidney disease stage 3-4 and left BKA; who presented to the hospital secondary to acute respiratory failure with hypoxia and worsening wheezing. Patient with generalized weakness and altered mental status; found by EMS with oxygen saturation around 65-70% only oriented to person. X-ray without infiltrates; patient was afebrile and with normal WBCs. Of note she was about 5 weeks ago admitted to [facility] secondary to community acquired pneumonia and influenza.  Assessment/Plan: 1-acute respiratory failure with hypoxia: Appears to be secondary to asthma exacerbation/bronchiectasis and bronchitis. -no fever, normal WBC's; resp status improving  -will continue atrovent, xopenex, pulmicort and prednisone; will also continue doxycycline.  -off BIPAP currently, but requiring high amount of oxygen supplementation; continue weaning process as tolerated.  -continue flutter valve -follow cx's results  -Influenza PCR negative; Procalcitonin < 0.10; suggesting no significant bacterial infection   2-SIRS: Due to asthma exacerbation/presumed bronchiectasis -patient lactic acid is now resolved -no sepsis  -continue supportive care and treatment for her breathing   3-HIV -repeat CD4 count demonstrated level of 620 -will continue Genvoya -will need outpatient follow up with ID  4-elevated troponin and elevated d-dimer level: -no CP, no ischemic changes on EKG or telemetry -most likely demand ischemia Vs due to worsening into renal function -continue heparin until 2-D echo is completed -continue ASA by mouth now -will also follow V/Q scan  5-uncontrolled type 2 diabetes mellitus: On chronic insulin and  with nephropathy. -continue lantus and SSI -diet advance to modified carbohydrates -will adjust hypoglycemic regimen as needed  -A1C 9.8 -holding oral hypoglycemic regimen as needed   6-acute on chronic renal failure: Stage III at baseline -appears to be pre-renal in nature -continue use of nephrotoxic agents and soft/low BP on admission contributing to renal failure -will continue minimizing nephrotoxic drugs  -will continue IVF's, but will adjust rate and advance diet  -will follow renal function trend   7-obesity -Body mass index is 35.87 kg/m. -low calorie diet and increase physical activity recommended  8-tobacco abuse -will continue nicoderm  -cessation counseling provided.  9-HLD -will continue crestor  Code Status: Full code Family Communication: no family at bedside Disposition Plan: will continue monitoring her in stepdown for now (breathing status is still unstable). Continue doxycycline, continue nebulizer treatments and prednisone. Will continue weaning oxygen supplementation off as tolerated.. Follow 2-D echo.   Consultants:  None  Procedures:  See below for x-ray report  RLE duplex: neg for SVT, DVT or baker's cyst   2-D echo: pending   Antibiotics:  Doxycycline 05/20/16  HPI/Subjective: Patient feeling better overall. AAOX3, no CP and with improvement in her breathing. Now off BIPAP. Still with mild difficulty speaking in full sentences, with audible wheezing and requiring oxygen supplementation.   Objective: Vitals:   05/21/16 0300 05/21/16 0738  BP: 93/66 (!) 117/99  Pulse: 96 93  Resp: (!) 21 (!) 22  Temp:  97.8 F (36.6 C)    Intake/Output Summary (Last 24 hours) at 05/21/16 0956 Last data filed at 05/21/16 0100  Gross per 24 hour  Intake          2673.48 ml  Output                0 ml  Net  2673.48 ml   Filed Weights   05/20/16 0000  Weight: 113.4 kg (250 lb)    Exam:   General:  Afebrile, denying CP and reporting  improvement in her breathing. Patient is more alert and currently oriented X3. No nausea, no vomiting and denying abd pain. Still requiring 5-6L of [place] supplementation and is audible wheezing. Mild difficulty speaking in full sentences.   Cardiovascular: No rubs, no gallops, no murmur; patient without JVD (even assessment is not accurate due to body habitus), no murmur.    Respiratory: positive exp wheezing, no crackles appreciated. Improved air movement. Still requiring oxygen supplementation.  Abdomen: obese, soft, NT, ND, positive BS   Musculoskeletal: no edema, cyanosis, erythema or clubbing. Left BKA; RLE intact.  Data Reviewed: Basic Metabolic Panel:  Recent Labs Lab 05/19/16 2217 05/19/16 2243 05/21/16 0603  NA 135 138 141  K 4.5 4.5 2.8*  CL 98* 98* 106  CO2 23  --  26  GLUCOSE 567* 563* 132*  BUN 19 26* 9  CREATININE 2.23* 1.90* 1.07*  CALCIUM 9.0  --  8.4*   Liver Function Tests:  Recent Labs Lab 05/19/16 2217  AST 28  ALT 17  ALKPHOS 80  BILITOT 0.5  PROT 7.5  ALBUMIN 3.4*   CBC:  Recent Labs Lab 05/19/16 2217 05/19/16 2243 05/21/16 0603  WBC 9.6  --  8.6  NEUTROABS 8.0*  --   --   HGB 13.7 16.3* 10.5*  HCT 45.8 48.0* 36.5  MCV 91.6  --  89.7  PLT 340  --  317   Cardiac Enzymes:  Recent Labs Lab 05/20/16 0049 05/20/16 0647 05/20/16 1206  TROPONINI 0.83* 1.21* 0.85*   BNP (last 3 results)  Recent Labs  05/19/16 2217  BNP 520.0*   CBG:  Recent Labs Lab 05/20/16 1228 05/20/16 1408 05/20/16 1833 05/20/16 2108 05/21/16 0846  GLUCAP 347* 245* 176* 156* 109*    Recent Results (from the past 240 hour(s))  Culture, blood (Routine X 2) w Reflex to ID Panel     Status: None (Preliminary result)   Collection Time: 05/19/16 10:17 PM  Result Value Ref Range Status   Specimen Description BLOOD RIGHT ANTECUBITAL  Final   Special Requests   Final    BOTTLES DRAWN AEROBIC AND ANAEROBIC Blood Culture adequate volume   Culture NO GROWTH 2  DAYS  Final   Report Status PENDING  Incomplete  Culture, blood (Routine X 2) w Reflex to ID Panel     Status: None (Preliminary result)   Collection Time: 05/19/16 10:17 PM  Result Value Ref Range Status   Specimen Description BLOOD LEFT ANTECUBITAL  Final   Special Requests   Final    BOTTLES DRAWN AEROBIC AND ANAEROBIC Blood Culture adequate volume   Culture NO GROWTH 2 DAYS  Final   Report Status PENDING  Incomplete  MRSA PCR Screening     Status: None   Collection Time: 05/20/16  3:56 PM  Result Value Ref Range Status   MRSA by PCR NEGATIVE NEGATIVE Final    Comment:        The GeneXpert MRSA Assay (FDA approved for NASAL specimens only), is one component of a comprehensive MRSA colonization surveillance program. It is not intended to diagnose MRSA infection nor to guide or monitor treatment for MRSA infections.      Studies: Ct Head Wo Contrast  Result Date: 05/20/2016 CLINICAL DATA:  Acute encephalopathy, weakness for 2 weeks EXAM: CT HEAD WITHOUT CONTRAST TECHNIQUE: Contiguous  axial images were obtained from the base of the skull through the vertex without intravenous contrast. COMPARISON:  None. FINDINGS: Brain: Mild superficial and central atrophy with minimal small vessel ischemic disease of periventricular white matter. No intra-axial mass, hemorrhage or midline. No extra-axial fluid collections. Vascular: Mild atherosclerosis of the carotid siphons no hyperdense vessels. Skull: Negative for fracture or focal lesion. Sinuses/Orbits: The visualized mastoids, paranasal sinuses, orbits and globes are unremarkable. Other: None. IMPRESSION: Mild atrophy with minimal small vessel ischemic disease of periventricular white matter. No acute intracranial appearing abnormality. Electronically Signed   By: Ashley Royalty M.D.   On: 05/20/2016 01:50   Dg Chest Port 1 View  Result Date: 05/20/2016 CLINICAL DATA:  Evaluate central line placement EXAM: PORTABLE CHEST 1 VIEW COMPARISON:   May 19, 2016 FINDINGS: A new right central line terminates near the caval atrial junction. No pneumothorax. Mild bibasilar atelectasis. No other acute abnormality or change. IMPRESSION: The new right central line is in good position.  No pneumothorax. Electronically Signed   By: Dorise Bullion III M.D   On: 05/20/2016 18:32   Dg Chest Port 1 View  Result Date: 05/19/2016 CLINICAL DATA:  Weakness EXAM: PORTABLE CHEST 1 VIEW COMPARISON:  04/25/2016 FINDINGS: The heart size and mediastinal contours are within normal limits. Both lungs are clear. Chronic bilateral healed sixth and seventh rib fractures. IMPRESSION: No active disease. Chronic bilateral sixth and seventh rib fractures. Electronically Signed   By: Ashley Royalty M.D.   On: 05/19/2016 23:06    Scheduled Meds: . aspirin  300 mg Rectal Daily  . budesonide (PULMICORT) nebulizer solution  0.5 mg Nebulization BID  . insulin aspart  0-20 Units Subcutaneous TID WC  . insulin aspart  0-5 Units Subcutaneous QHS  . insulin glargine  50 Units Subcutaneous BID  . ipratropium  0.5 mg Nebulization QID  . levalbuterol  1.25 mg Nebulization QID  . nicotine  21 mg Transdermal Daily  . predniSONE  40 mg Oral BID WC   Continuous Infusions: . sodium chloride 50 mL/hr at 05/21/16 0858  . doxycycline (VIBRAMYCIN) IV Stopped (05/20/16 2159)  . famotidine (PEPCID) IV 20 mg (05/21/16 0911)  . heparin 1,450 Units/hr (05/20/16 2303)  . potassium chloride 10 mEq (05/21/16 0858)    Principal Problem:   Acute on chronic respiratory failure with hypoxia ([name]) Active Problems:   Human immunodeficiency virus (HIV) disease ([place])   Diabetes mellitus type 2 with complications, uncontrolled ([place])   Dyslipidemia   Essential hypertension   Asthma exacerbation attacks   Diastolic dysfunction   Chronic kidney disease (CKD), stage IV (severe) ([name])   Hyperlipidemia   NSTEMI (non-ST elevated myocardial infarction) ([place])   Acute metabolic encephalopathy    Difficult intravenous access    Time spent: 25 minutes    Barton Dubois  [facility] Pager [phone]. If 7PM-7AM, please contact night-coverage at [email or web address], password [facility] 05/21/2016, 9:56 AM  LOS: 2 days

## 2016-05-21 NOTE — Progress Notes (Signed)
**  Preliminary report by tech**  Right lower extremity venous duplex complete. There is no evidence of deep or superficial vein thrombosis involving the right lower extremity. All visualized vessels appear patent and compressible. There is no evidence of a Baker's cyst on the right.  05/21/16 9:33 AM [name] RVT

## 2016-05-21 NOTE — Progress Notes (Signed)
ANTICOAGULATION CONSULT NOTE - Follow Up Consult  Pharmacy Consult:  Heparin Indication: chest pain/ACS  Allergies  Allergen Reactions  . Cortizone-10 [Hydrocortisone] Rash    Per patient, given local injection at knee and developed rash at local site.     Patient Measurements: Height: 5\' 10"  (177.8 cm) (from March 2018 records) Weight: 250 lb (113.4 kg) (from March 2018 records) IBW/kg (Calculated) : 68.5 Heparin Dosing Weight: 95 kg  Vital Signs: Temp: 97.8 F (36.6 C) (05/21 0738) Temp Source: Oral (05/21 0738) BP: 117/99 (05/21 0738) Pulse Rate: 93 (05/21 0738)  Labs:  Recent Labs  05/19/16 2217 05/19/16 2243 05/20/16 0049 05/20/16 0647  05/20/16 1206 05/20/16 1533 05/21/16 0603 05/21/16 1125  HGB 13.7 16.3*  --   --   --   --   --  10.5*  --   HCT 45.8 48.0*  --   --   --   --   --  36.5  --   PLT 340  --   --   --   --   --   --  317  --   LABPROT  --   --  13.8  --   --   --   --   --   --   INR  --   --  1.06  --   --   --   --   --   --   HEPARINUNFRC  --   --   --   --   < >  --  0.26* <0.10* 0.53  CREATININE 2.23* 1.90*  --   --   --   --   --  1.07*  --   TROPONINI  --   --  0.83* 1.21*  --  0.85*  --   --   --   < > = values in this interval not displayed.  Estimated Creatinine Clearance: 84 mL/min (A) (by C-G formula based on SCr of 1.07 mg/dL (H)).    Assessment: 4 YOF to continue on IV heparin for ACS.   Heparin level therapeutic this PM   Goal of Therapy:  Heparin level 0.3-0.7 units/ml Monitor platelets by anticoagulation protocol: Yes   Plan:  Continue heparin at 1450 units / hr Daily heparin level and CBC  Thank you Anette Guarneri, PharmD [phone] 05/21/2016, 12:44 PM

## 2016-05-21 NOTE — Progress Notes (Signed)
Patient very confused, hallucinating, constantly trying to get out of bed. Patient is very combative at times. Notified MD to get safety sitter order. No sitters available. NT at bedside currently. Will continue to monitor.

## 2016-05-22 ENCOUNTER — Other Ambulatory Visit (HOSPITAL_COMMUNITY): Payer: Medicare HMO

## 2016-05-22 ENCOUNTER — Inpatient Hospital Stay (HOSPITAL_COMMUNITY): Payer: Medicare HMO

## 2016-05-22 DIAGNOSIS — E662 Morbid (severe) obesity with alveolar hypoventilation: Secondary | ICD-10-CM

## 2016-05-22 DIAGNOSIS — B370 Candidal stomatitis: Secondary | ICD-10-CM

## 2016-05-22 DIAGNOSIS — E661 Drug-induced obesity: Secondary | ICD-10-CM

## 2016-05-22 DIAGNOSIS — Z6838 Body mass index (BMI) 38.0-38.9, adult: Secondary | ICD-10-CM

## 2016-05-22 DIAGNOSIS — R7989 Other specified abnormal findings of blood chemistry: Secondary | ICD-10-CM

## 2016-05-22 LAB — BASIC METABOLIC PANEL
Anion gap: 8 (ref 5–15)
BUN: 7 mg/dL (ref 6–20)
CO2: 28 mmol/L (ref 22–32)
Calcium: 8.8 mg/dL — ABNORMAL LOW (ref 8.9–10.3)
Chloride: 106 mmol/L (ref 101–111)
Creatinine, Ser: 0.91 mg/dL (ref 0.44–1.00)
GFR calc Af Amer: >60 mL/min (ref >60)
GFR calc non Af Amer: >60 mL/min (ref >60)
GLUCOSE: 143 mg/dL — AB (ref 65–99)
POTASSIUM: 3.1 mmol/L — AB (ref 3.5–5.1)
Sodium: 142 mmol/L (ref 135–145)

## 2016-05-22 LAB — CBC
HCT: 36.1 % (ref 36.0–46.0)
Hemoglobin: 10.7 g/dL — ABNORMAL LOW (ref 12.0–15.0)
MCH: 26 pg (ref 26.0–34.0)
MCHC: 29.6 g/dL — AB (ref 30.0–36.0)
MCV: 87.6 fL (ref 78.0–100.0)
Platelets: 313 10*3/uL (ref 150–400)
RBC: 4.12 MIL/uL (ref 3.87–5.11)
RDW: 17.2 % — ABNORMAL HIGH (ref 11.5–15.5)
WBC: 8.1 10*3/uL (ref 4.0–10.5)

## 2016-05-22 LAB — GLUCOSE, CAPILLARY
Glucose-Capillary: 100 mg/dL — ABNORMAL HIGH (ref 65–99)
Glucose-Capillary: 174 mg/dL — ABNORMAL HIGH (ref 65–99)
Glucose-Capillary: 146 mg/dL — ABNORMAL HIGH (ref 65–99)
Glucose-Capillary: 99 mg/dL (ref 65–99)

## 2016-05-22 LAB — TSH: TSH: 1.571 u[IU]/mL (ref 0.350–4.500)

## 2016-05-22 LAB — VITAMIN B12: Vitamin B-12: 784 pg/mL (ref 180–914)

## 2016-05-22 LAB — HEPARIN LEVEL (UNFRACTIONATED): HEPARIN UNFRACTIONATED: 0.69 [IU]/mL (ref 0.30–0.70)

## 2016-05-22 MED ORDER — PERFLUTREN LIPID MICROSPHERE
1.0000 mL | INTRAVENOUS | Status: DC | PRN
  Filled 2016-05-22: qty 10

## 2016-05-22 MED ORDER — IPRATROPIUM BROMIDE 0.02 % IN SOLN
0.5000 mg | Freq: Three times a day (TID) | RESPIRATORY_TRACT | Status: DC
  Administered 2016-05-22 – 2016-05-25 (×11): 0.5 mg via RESPIRATORY_TRACT
  Filled 2016-05-22 (×13): qty 2.5

## 2016-05-22 MED ORDER — DOXYCYCLINE HYCLATE 100 MG PO TABS
100.0000 mg | ORAL_TABLET | Freq: Two times a day (BID) | ORAL | Status: DC
  Administered 2016-05-22 – 2016-05-25 (×7): 100 mg via ORAL
  Filled 2016-05-22 (×8): qty 1

## 2016-05-22 MED ORDER — ASPIRIN EC 81 MG PO TBEC
81.0000 mg | DELAYED_RELEASE_TABLET | Freq: Every day | ORAL | Status: DC
  Administered 2016-05-22 – 2016-05-25 (×4): 81 mg via ORAL
  Filled 2016-05-22 (×4): qty 1

## 2016-05-22 MED ORDER — LEVALBUTEROL HCL 1.25 MG/0.5ML IN NEBU
1.2500 mg | INHALATION_SOLUTION | Freq: Three times a day (TID) | RESPIRATORY_TRACT | Status: DC
  Administered 2016-05-22 – 2016-05-25 (×11): 1.25 mg via RESPIRATORY_TRACT
  Filled 2016-05-22 (×13): qty 0.5

## 2016-05-22 MED ORDER — NYSTATIN 100000 UNIT/ML MT SUSP
5.0000 mL | Freq: Four times a day (QID) | OROMUCOSAL | Status: DC
  Administered 2016-05-22 – 2016-05-24 (×7): 500000 [IU] via ORAL
  Filled 2016-05-22 (×11): qty 5

## 2016-05-22 MED ORDER — INSULIN ASPART 100 UNIT/ML ~~LOC~~ SOLN
5.0000 [IU] | Freq: Three times a day (TID) | SUBCUTANEOUS | Status: DC
  Administered 2016-05-24 – 2016-05-25 (×3): 5 [IU] via SUBCUTANEOUS

## 2016-05-22 MED ORDER — PANTOPRAZOLE SODIUM 40 MG PO TBEC
40.0000 mg | DELAYED_RELEASE_TABLET | Freq: Every day | ORAL | Status: DC
  Administered 2016-05-22 – 2016-05-25 (×4): 40 mg via ORAL
  Filled 2016-05-22 (×4): qty 1

## 2016-05-22 NOTE — Progress Notes (Signed)
ANTICOAGULATION CONSULT NOTE - Follow Up Consult  Pharmacy Consult:  Heparin Indication: chest pain/ACS  Allergies  Allergen Reactions  . Cortizone-10 [Hydrocortisone] Rash    Per patient, given local injection at knee and developed rash at local site.     Patient Measurements: Height: 5\' 10"  (177.8 cm) (from March 2018 records) Weight: 242 lb 15.2 oz (110.2 kg) IBW/kg (Calculated) : 68.5 Heparin Dosing Weight: 95 kg  Vital Signs: Temp: 97.7 F (36.5 C) (05/22 0725) Temp Source: Oral (05/22 0725) BP: 121/88 (05/22 0725) Pulse Rate: 86 (05/22 0921)  Labs:  Recent Labs  05/19/16 2217 05/19/16 2243 05/20/16 0049 05/20/16 0647  05/20/16 1206  05/21/16 0603 05/21/16 1125 05/22/16 0425  HGB 13.7 16.3*  --   --   --   --   --  10.5*  --  10.7*  HCT 45.8 48.0*  --   --   --   --   --  36.5  --  36.1  PLT 340  --   --   --   --   --   --  317  --  313  LABPROT  --   --  13.8  --   --   --   --   --   --   --   INR  --   --  1.06  --   --   --   --   --   --   --   HEPARINUNFRC  --   --   --   --   < >  --   < > <0.10* 0.53 0.69  CREATININE 2.23* 1.90*  --   --   --   --   --  1.07*  --  0.91  TROPONINI  --   --  0.83* 1.21*  --  0.85*  --   --   --   --   < > = values in this interval not displayed.  Estimated Creatinine Clearance: 97.3 mL/min (by C-G formula based on SCr of 0.91 mg/dL).    Assessment: 35 YOF to continue on IV heparin for ACS.   Heparin level therapeutic this am but on higher end of range  Cbc stable  Goal of Therapy:  Heparin level 0.3-0.7 units/ml Monitor platelets by anticoagulation protocol: Yes   Plan:  Decrease heparin to 1400 units / hr Daily heparin level and CBC  Levester Fresh, PharmD, BCPS, BCCCP Clinical Pharmacist Clinical phone for 05/22/2016 from 7a-3:30p: [phone] If after 3:30p, please call main pharmacy at: [phone] 05/22/2016 9:54 AM

## 2016-05-22 NOTE — Progress Notes (Signed)
[facility] PROGRESS NOTE  [name] TIR:[id number] DOB: 1965-08-14 DOA: 05/19/2016 PCP: Michel Harrow, PA-C  Interim summary and HPI 51 year old female with medical history significant for hypertension, HIV, diabetes, asthma/COPD, GERD, depression, tobacco abuse, chronic kidney disease stage 3-4 and left BKA; who presented to the hospital secondary to acute respiratory failure with hypoxia and worsening wheezing. Patient with generalized weakness and altered mental status; found by EMS with oxygen saturation around 65-70% only oriented to person. X-ray without infiltrates; patient was afebrile and with normal WBCs. Of note she was about 5 weeks ago admitted to [facility] secondary to community acquired pneumonia and influenza.  Assessment/Plan: 1-acute respiratory failure with hypoxia: Appears to be secondary to asthma exacerbation/bronchiectasis and bronchitis. -normal WBCs, no fever, patient denies productive cough. -Will continue treatment with Atrovent, supple neck, Pulmicort and prednisone. Will also continue doxycycline as part of treatment for bronchiectasis/bronchitis. Will switch to by mouth. -will try flutter valve, once mentation improved -neg PCR and procalcitonin < 0.10 (suggestive of not significant bacterial infection) -CT angio will clarify lung structure and help dictating further care -patient with obesity and most likely with OSA/OHS. Will start empiric CPAP QHS. -will benefit of sleep study at discharge.  2-SIRS: Due to asthma exacerbation/presumed bronchiectasis -normal lactic acid at this moment -No sepsis -Will continue supportive care and focus treatment to improve her breathing.  3-HIV -CD4 count 620 -Will continue Genvoya -Continue outpatient follow-up with infectious disease service  4-elevated troponin and elevated d-dimer level: -Patient without chest pain, no ischemic changes appreciated on her EKG or telemetry.  -Will follow 2-D  echo and now that her kidney function has stabilized will check CTA of her lungs to r/o PE. Is these stable, will d/c heparin -continue baby aspirin  5-uncontrolled type 2 diabetes mellitus: On chronic insulin and with nephropathy. -Will continue sliding scale insulin, Lantus and will also add NovoLog for meal coverage -Patient A1c 9.8; demonstrated poor diabetes control -Continue modified carbohydrates -Will continue holding oral hypoglycemic regimen while inpatient.  6-acute on chronic renal failure: Stage III at baseline -Appears to be prerenal in nature  -Currently resolve after fluid resuscitation. -Continue holding nephrotoxic agents (especially blood pressure meds and diuretics) for another 24 hours.  -Will follow renal function trend.  -UA without signs of UTI.  7-obesity -Body mass index is 34.86 kg/m. -Low calorie diet and increase physical activity has been discussed. Once her mentation is further improve further discussions about this topic. Needed.  8-tobacco abuse -Continue Nicoderm -Cessation counseling provided  9-HLD -Continue statins  10-delirium/confusion -Patient with negative CT scan of the head -Will check TSH, B-12 and RPR -Will also assess ABG to rule out hypercapnia -Minimizing sedatives medications; while still providing some of her chronic benzo's and pain medication as needed to prevent withdrawal. -will follow clinical response.  11-thrush -Will treat with nystatin  Code Status: Full code Family Communication: no family at bedside Disposition Plan: Patient will remain stepdown, her confusion/delirium will be further investigated (will check TSH, B-12, RPR and ABG); will start CPAP, or earlier if hypercapnia identified on ABG. Continue minimizing sedative medication  and providing supportive care.   Consultants:  None  Procedures:  See below for x-ray report  RLE duplex: neg for SVT, DVT or baker's cyst   2-D echo: pending    Antibiotics:  Doxycycline 05/20/16  HPI/Subjective: Oriented 1, confused and with poor insight. Denies chest pain, still short of breath, unable to speak in full sentences and requiring oxygen supplementation.  Increased confusion, agitation and combativeness on 5/21 that ended requiring restrains.   Objective: Vitals:   05/22/16 0921 05/22/16 1125  BP:  130/83  Pulse: 86 97  Resp: 18 17  Temp:  98.2 F (36.8 C)    Intake/Output Summary (Last 24 hours) at 05/22/16 1145 Last data filed at 05/22/16 0830  Gross per 24 hour  Intake          2380.24 ml  Output                0 ml  Net          2380.24 ml   Filed Weights   05/20/16 0000 05/22/16 0426  Weight: 113.4 kg (250 lb) 110.2 kg (242 lb 15.2 oz)    Exam:   General:  Patient is afebrile, confused, with poor insight and only oriented 1 currently. Denies chest pain, abdominal pain, nausea, vomiting or any other complaints. Continue to be requiring oxygen supplementation, and his short of breath/on able to speak in full sentences.   Cardiovascular: No rubs, no gallops, no murmurs, no JVD appreciated on exam (even body habitus make this task difficult).   Respiratory: Positive bilateral expiratory wheezing, no crackles, scattered rhonchi, tachypneic with minimal exertion and with mildly use of abdominal muscles.    Abdomen: Obese, soft, nontender, nondistended, positive bowel sounds.   Musculoskeletal: No edema, no cyanosis, no clubbing. Left BKA   Data Reviewed: Basic Metabolic Panel:  Recent Labs Lab 05/19/16 2217 05/19/16 2243 05/21/16 0603 05/22/16 0425  NA 135 138 141 142  K 4.5 4.5 2.8* 3.1*  CL 98* 98* 106 106  CO2 23  --  26 28  GLUCOSE 567* 563* 132* 143*  BUN 19 26* 9 7  CREATININE 2.23* 1.90* 1.07* 0.91  CALCIUM 9.0  --  8.4* 8.8*   Liver Function Tests:  Recent Labs Lab 05/19/16 2217  AST 28  ALT 17  ALKPHOS 80  BILITOT 0.5  PROT 7.5  ALBUMIN 3.4*   CBC:  Recent Labs Lab  05/19/16 2217 05/19/16 2243 05/21/16 0603 05/22/16 0425  WBC 9.6  --  8.6 8.1  NEUTROABS 8.0*  --   --   --   HGB 13.7 16.3* 10.5* 10.7*  HCT 45.8 48.0* 36.5 36.1  MCV 91.6  --  89.7 87.6  PLT 340  --  317 313   Cardiac Enzymes:  Recent Labs Lab 05/20/16 0049 05/20/16 0647 05/20/16 1206  TROPONINI 0.83* 1.21* 0.85*   BNP (last 3 results)  Recent Labs  05/19/16 2217  BNP 520.0*   CBG:  Recent Labs Lab 05/21/16 0846 05/21/16 1249 05/21/16 1642 05/21/16 2043 05/22/16 0717  GLUCAP 109* 319* 273* 202* 100*    Recent Results (from the past 240 hour(s))  Culture, blood (Routine X 2) w Reflex to ID Panel     Status: None (Preliminary result)   Collection Time: 05/19/16 10:17 PM  Result Value Ref Range Status   Specimen Description BLOOD RIGHT ANTECUBITAL  Final   Special Requests   Final    BOTTLES DRAWN AEROBIC AND ANAEROBIC Blood Culture adequate volume   Culture NO GROWTH 2 DAYS  Final   Report Status PENDING  Incomplete  Culture, blood (Routine X 2) w Reflex to ID Panel     Status: None (Preliminary result)   Collection Time: 05/19/16 10:17 PM  Result Value Ref Range Status   Specimen Description BLOOD LEFT ANTECUBITAL  Final   Special Requests   Final  BOTTLES DRAWN AEROBIC AND ANAEROBIC Blood Culture adequate volume   Culture NO GROWTH 2 DAYS  Final   Report Status PENDING  Incomplete  MRSA PCR Screening     Status: None   Collection Time: 05/20/16  3:56 PM  Result Value Ref Range Status   MRSA by PCR NEGATIVE NEGATIVE Final    Comment:        The GeneXpert MRSA Assay (FDA approved for NASAL specimens only), is one component of a comprehensive MRSA colonization surveillance program. It is not intended to diagnose MRSA infection nor to guide or monitor treatment for MRSA infections.      Studies: Dg Chest Port 1 View  Result Date: 05/20/2016 CLINICAL DATA:  Evaluate central line placement EXAM: PORTABLE CHEST 1 VIEW COMPARISON:  May 19, 2016  FINDINGS: A new right central line terminates near the caval atrial junction. No pneumothorax. Mild bibasilar atelectasis. No other acute abnormality or change. IMPRESSION: The new right central line is in good position.  No pneumothorax. Electronically Signed   By: Dorise Bullion III M.D   On: 05/20/2016 18:32    Scheduled Meds: . aspirin  300 mg Rectal Daily  . budesonide (PULMICORT) nebulizer solution  0.5 mg Nebulization BID  . elvitegravir-cobicistat-emtricitabine-tenofovir  1 tablet Oral Q breakfast  . insulin aspart  0-20 Units Subcutaneous TID WC  . insulin aspart  0-5 Units Subcutaneous QHS  . insulin aspart  5 Units Subcutaneous TID WC  . insulin glargine  50 Units Subcutaneous BID  . ipratropium  0.5 mg Nebulization TID  . levalbuterol  1.25 mg Nebulization TID  . nicotine  21 mg Transdermal Daily  . nystatin  5 mL Oral QID  . predniSONE  40 mg Oral BID WC  . rosuvastatin  20 mg Oral q1800   Continuous Infusions: . sodium chloride 50 mL/hr at 05/21/16 1957  . doxycycline (VIBRAMYCIN) IV Stopped (05/22/16 1010)  . famotidine (PEPCID) IV Stopped (05/22/16 0839)  . heparin 1,400 Units/hr (05/22/16 1123)    Principal Problem:   Acute on chronic respiratory failure with hypoxia ([name]) Active Problems:   Human immunodeficiency virus (HIV) disease ([place])   Diabetes mellitus type 2 with complications, uncontrolled ([place])   Dyslipidemia   Essential hypertension   Asthma exacerbation attacks   Diastolic dysfunction   Chronic kidney disease (CKD), stage IV (severe) ([name])   Hyperlipidemia   NSTEMI (non-ST elevated myocardial infarction) ([place])   Acute metabolic encephalopathy   Difficult intravenous access    Time spent: 35 minutes    Barton Dubois  [facility] Pager [phone]. If 7PM-7AM, please contact night-coverage at [email or web address], password [facility] 05/22/2016, 11:45 AM  LOS: 3 days

## 2016-05-22 NOTE — Progress Notes (Signed)
RT placed patient on CPAP HS. 4L O2 bleed in needed. Patient tolerating well at this time.  

## 2016-05-22 NOTE — Evaluation (Signed)
Physical Therapy Evaluation Patient Details Name: [name] MRN: [id number] DOB: 07-18-1965 Today's Date: 05/22/2016   History of Present Illness  Patient is a 51 y/o female admitted with acute respiratory failure with hypoxia, worsening wheezing, generalized weakness and AMS; found by EMS with 02 around 65-70% only oriented to person. Pt found to have elevated troponin and elevated d-dimer level most likely demand ischemia Vs worsening renal function. PMH includes HIV, DM with retinopathy and peripheral neuropathy, PVD, Left BKA, HTN, depression.  Clinical Impression  Patient presents with generalized weakness, confusion, decreased attention, awareness, problem solving and impaired mobility s/p above. Pt not able to provide PLOF/history due to confusion. Tolerated standing and taking a few steps to get to chair with max directional cues and min A for balance/redirection. Anticipate once mentation improves, mobility will improve as well. Pt lives with mom (per report and notes from prior admission), not sure of level of support she can provide. Will follow acutely to maximize independence and mobility prior to return home.     Follow Up Recommendations No PT follow up;Supervision for mobility/OOB;Supervision/Assistance - 24 hour    Equipment Recommendations  None recommended by PT    Recommendations for Other Services       Precautions / Restrictions Precautions Precautions: Fall Precaution Comments: left BKA Restrictions Weight Bearing Restrictions: No      Mobility  Bed Mobility Overal bed mobility: Needs Assistance Bed Mobility: Rolling;Sidelying to Sit Rolling: Min guard Sidelying to sit: Min assist;HOB elevated       General bed mobility comments: Assist to elevate trunk to get to EOB with increased time and cues, heavy use of rail.  Transfers Overall transfer level: Needs assistance Equipment used: Rolling walker (2 wheeled) Transfers: Sit to/from [employer] Sit to Stand: Min assist Stand pivot transfers: Min assist       General transfer comment: Assist to boost to standing with pt pulling up on RW. Able to take a few steps to get to chair with max directional cues and assist navigating RW as pt with difficulty following instructions.  Ambulation/Gait                Stairs            Wheelchair Mobility    Modified Rankin (Stroke Patients Only)       Balance Overall balance assessment: Needs assistance Sitting-balance support: Feet supported;No upper extremity supported Sitting balance-Leahy Scale: Fair Sitting balance - Comments: able to try to attempt donning liner but needed redirection as pt donning on foot instead of residual limb   Standing balance support: During functional activity;Bilateral upper extremity supported Standing balance-Leahy Scale: Poor Standing balance comment: Relient on BUEs for support in standing, with RW.                              Pertinent Vitals/Pain Pain Assessment: Faces Faces Pain Scale: No hurt    Home Living Family/patient expects to be discharged to:: Private residence Living Arrangements: Parent (mom) Available Help at Discharge: Family;Available 24 hours/day Type of Home: House Home Access: Stairs to enter Entrance Stairs-Rails: Psychiatric nurse of Steps: 3 Home Layout: One level Home Equipment: Walker - 2 wheels;Cane - single point;Wheelchair - [employer];Shower seat Additional Comments: Pt able to provide part of above info but other taken from prior admission >1 year ago.    Prior Function Level of Independence: Independent with assistive device(s)  Comments: Ambulates with RW or manual w/c.     Hand Dominance   Dominant Hand: Right    Extremity/Trunk Assessment   Upper Extremity Assessment Upper Extremity Assessment: Defer to OT evaluation    Lower Extremity Assessment Lower Extremity  Assessment: Generalized weakness (Difficulty following commands to participate in MMT. Able to bring RLE onto left thigh.)       Communication   Communication: No difficulties  Cognition Arousal/Alertness: Awake/alert Behavior During Therapy: Flat affect Overall Cognitive Status: Impaired/Different from baseline Area of Impairment: Orientation;Attention;Awareness;Safety/judgement;Following commands;Problem solving                 Orientation Level: Disoriented to;Time;Situation Current Attention Level: Focused   Following Commands: Follows one step commands inconsistently;Follows one step commands with increased time Safety/Judgement: Decreased awareness of safety;Decreased awareness of deficits Awareness:  (pre intellectual) Problem Solving: Slow processing;Requires verbal cues General Comments: Difficulty following 1 step commands and needs redirection to task. Not able to problem solve getting to chair despite cues. Trying to place liner on foot of intact extremity instead of residual limb. Talking out of her head at times unrelated to questions asked.      General Comments General comments (skin integrity, edema, etc.): VSS throughout. 2/4 DOe with mobility.     Exercises     Assessment/Plan    PT Assessment Patient needs continued PT services  PT Problem List Decreased strength;Decreased mobility;Decreased safety awareness;Decreased cognition;Cardiopulmonary status limiting activity;Decreased balance;Decreased activity tolerance;Obesity       PT Treatment Interventions Therapeutic activities;Gait training;Therapeutic exercise;Patient/family education;Balance training;Functional mobility training;Stair training;Cognitive remediation    PT Goals (Current goals can be found in the Care Plan section)  Acute Rehab PT Goals Patient Stated Goal: none stated PT Goal Formulation: Patient unable to participate in goal setting Time For Goal Achievement: 06/05/16 Potential to  Achieve Goals: Fair    Frequency Min 3X/week   Barriers to discharge   not sure support mom can provide    Co-evaluation               AM-PAC PT "6 Clicks" Daily Activity  Outcome Measure Difficulty turning over in bed (including adjusting bedclothes, sheets and blankets)?: None Difficulty moving from lying on back to sitting on the side of the bed? : Total Difficulty sitting down on and standing up from a chair with arms (e.g., wheelchair, bedside commode, etc,.)?: Total Help needed moving to and from a bed to chair (including a wheelchair)?: A Little Help needed walking in hospital room?: A Little Help needed climbing 3-5 steps with a railing? : A Lot 6 Click Score: 14    End of Session Equipment Utilized During Treatment: Gait belt;Oxygen Activity Tolerance: Patient tolerated treatment well Patient left: in chair;with call bell/phone within reach;with nursing/sitter in room;with chair alarm set Nurse Communication: Mobility status PT Visit Diagnosis: Unsteadiness on feet (R26.81);Muscle weakness (generalized) (M62.81);Other (comment) (confusion)    Time: 0626-9485 PT Time Calculation (min) (ACUTE ONLY): 15 min   Charges:   PT Evaluation $PT Eval Moderate Complexity: 1 Procedure     PT G Codes:        Wray Kearns, PT, DPT [phone]    Marguarite Arbour A Ladaija Dimino 05/22/2016, 2:59 PM

## 2016-05-22 NOTE — Progress Notes (Signed)
Mother called and stated she is concerned about patient being discharged to home. She feels as if patient is unsafe to go home. She mentioned patient got sick a few months ago with pna, strep throat and flu and has being trying to cope with the pain and all the coughing. Stated the patient has been clean from alcohol for 10 years and has recently being seeking pills. States one of patien't friends brought her 3 Xanax pills to take that was prescribed to her friend. She states patient got her Klonopin filled on 5/12 of 120 pills and on 5/19 only 30 pills were in the bottle. Stated she had to have taken 90 pills in 7 days when she can have 3-4 per day. Sitter at bedside with patient. Patient drowsy from Ativan this even. Swallowed PM meds without swallowing difficulty. Will report to oncoming nurse to make MD in AM aware of mother concerns.

## 2016-05-22 NOTE — Care Management Note (Addendum)
Case Management Note  Patient Details  Name: [name] MRN: [id number] Date of Birth: 22-May-1965  Subjective/Objective:   From home with mom, pta indep with  manuel w/chair and rolling walker and cane.  Presents with acute resp failure with hypoxia secondary to asthma ex/ bronchiectasis and bronchitis, SIRS, 042, elevated trops and d-dimer, uncontrolled DMtype 2, acute on chronic renal failure, obesity, tobacco abuse, delirium/confusion,thrush.  Conts on 3 liters oxygen.  Has [employer] / Medicaid insurance. Per pt eval no pt f/u needed. She is out of restraints but has a Air cabin crew, oriented to self and place. Has a l prosthesis.   5/23 Erin Bamberger RN, BSN - patient unable to make decisions at this time, updated pt eval rec are for SNF, CSW following. She has a Actuary with her.  CSW discussing with mom who is in a w/chair and can not care for patient at home.     PCP listed is [name]               Action/Plan: NCM will follow for dc needs.  Expected Discharge Date:                  Expected Discharge Plan:  Home/Self Care  In-House Referral:     Discharge planning Services  CM Consult  Post Acute Care Choice:    Choice offered to:     DME Arranged:    DME Agency:     HH Arranged:    HH Agency:     Status of Service:  In process, will continue to follow  If discussed at Long Length of Stay Meetings, dates discussed:    Additional Comments:  Erin Mayo, RN 05/22/2016, 3:14 PM

## 2016-05-22 NOTE — Progress Notes (Signed)
Notified MD about patient's refusal to have a PIV started. I was asked to get a 20 gauge or higher in the [facility] for CT Angio. Patient stated, "No, not today. Maybe tomorrow."  Echo staff attempted to get an echo but reported that patient was moving too much to obtain images. Supposedly patient will need to be still for 30 minutes to obtain the echo. Patient is fidgety and unable to sit still at all currently. PRN Haldol given.

## 2016-05-22 NOTE — Progress Notes (Signed)
Attempted to obtain ABG. Patient refused to be stuck again; MD paged.

## 2016-05-23 ENCOUNTER — Encounter (HOSPITAL_COMMUNITY): Payer: Self-pay

## 2016-05-23 ENCOUNTER — Inpatient Hospital Stay (HOSPITAL_COMMUNITY): Payer: Medicare HMO

## 2016-05-23 DIAGNOSIS — I519 Heart disease, unspecified: Secondary | ICD-10-CM

## 2016-05-23 LAB — BASIC METABOLIC PANEL
Anion gap: 8 (ref 5–15)
BUN: 7 mg/dL (ref 6–20)
CALCIUM: 8.5 mg/dL — AB (ref 8.9–10.3)
CO2: 31 mmol/L (ref 22–32)
Chloride: 105 mmol/L (ref 101–111)
Creatinine, Ser: 0.97 mg/dL (ref 0.44–1.00)
GFR calc Af Amer: >60 mL/min (ref >60)
GLUCOSE: 40 mg/dL — AB (ref 65–99)
Potassium: 2.4 mmol/L — CL (ref 3.5–5.1)
SODIUM: 144 mmol/L (ref 135–145)

## 2016-05-23 LAB — BLOOD GAS, ARTERIAL
Acid-Base Excess: 3 mmol/L — ABNORMAL HIGH (ref 0.0–2.0)
Bicarbonate: 28 mmol/L (ref 20.0–28.0)
DRAWN BY: 505221
O2 CONTENT: 2.5 L/min
O2 SAT: 93 %
PCO2 ART: 50.5 mmHg — AB (ref 32.0–48.0)
PH ART: 7.362 (ref 7.350–7.450)
Patient temperature: 98.6
pO2, Arterial: 67.2 mmHg — ABNORMAL LOW (ref 83.0–108.0)

## 2016-05-23 LAB — CBC
HCT: 34.1 % — ABNORMAL LOW (ref 36.0–46.0)
Hemoglobin: 10 g/dL — ABNORMAL LOW (ref 12.0–15.0)
MCH: 25.8 pg — ABNORMAL LOW (ref 26.0–34.0)
MCHC: 29.3 g/dL — ABNORMAL LOW (ref 30.0–36.0)
MCV: 88.1 fL (ref 78.0–100.0)
PLATELETS: 282 10*3/uL (ref 150–400)
RBC: 3.87 MIL/uL (ref 3.87–5.11)
RDW: 17.4 % — ABNORMAL HIGH (ref 11.5–15.5)
WBC: 8.2 10*3/uL (ref 4.0–10.5)

## 2016-05-23 LAB — MAGNESIUM: MAGNESIUM: 1.4 mg/dL — AB (ref 1.7–2.4)

## 2016-05-23 LAB — GLUCOSE, CAPILLARY
GLUCOSE-CAPILLARY: 30 mg/dL — AB (ref 65–99)
GLUCOSE-CAPILLARY: 148 mg/dL — AB (ref 65–99)
GLUCOSE-CAPILLARY: 181 mg/dL — AB (ref 65–99)
GLUCOSE-CAPILLARY: 294 mg/dL — AB (ref 65–99)
Glucose-Capillary: 128 mg/dL — ABNORMAL HIGH (ref 65–99)

## 2016-05-23 LAB — BRAIN NATRIURETIC PEPTIDE: B Natriuretic Peptide: 119.1 pg/mL — ABNORMAL HIGH (ref 0.0–100.0)

## 2016-05-23 LAB — HEPARIN LEVEL (UNFRACTIONATED): HEPARIN UNFRACTIONATED: 0.69 [IU]/mL (ref 0.30–0.70)

## 2016-05-23 LAB — POTASSIUM: Potassium: 3.8 mmol/L (ref 3.5–5.1)

## 2016-05-23 LAB — RPR: RPR: NONREACTIVE

## 2016-05-23 MED ORDER — IOPAMIDOL (ISOVUE-370) INJECTION 76%
INTRAVENOUS | Status: AC
  Administered 2016-05-23: 100 mL
  Filled 2016-05-23: qty 100

## 2016-05-23 MED ORDER — POTASSIUM CHLORIDE 10 MEQ/100ML IV SOLN
10.0000 meq | INTRAVENOUS | Status: AC
  Administered 2016-05-23 (×4): 10 meq via INTRAVENOUS
  Filled 2016-05-23 (×4): qty 100

## 2016-05-23 MED ORDER — MAGNESIUM SULFATE 2 GM/50ML IV SOLN
2.0000 g | Freq: Once | INTRAVENOUS | Status: AC
  Administered 2016-05-23: 2 g via INTRAVENOUS
  Filled 2016-05-23: qty 50

## 2016-05-23 MED ORDER — DEXTROSE 50 % IV SOLN
INTRAVENOUS | Status: AC
  Administered 2016-05-23: 50 mL
  Filled 2016-05-23: qty 50

## 2016-05-23 NOTE — NC FL2 (Signed)
[place] MEDICAID FL2 LEVEL OF CARE SCREENING TOOL     IDENTIFICATION  Patient Name: [name] Birthdate: [birth year] Sex: female Admission Date (Current Location): 05/19/2016  [facility] and [place] Number:  Herbalist and Address:  [facility] [place]. [facility], [place] [address] [place], [place] [address]      Provider Number: [id number]  Attending Physician Name and Address:  Mendel Corning, MD  Relative Name and Phone Number:       Current Level of Care: Hospital Recommended Level of Care: [place] Prior Approval Number:    Date Approved/Denied:   PASRR Number: [id number]  Discharge Plan: SNF    Current Diagnoses: Patient Active Problem List   Diagnosis Date Noted  . Difficult intravenous access   . Acute on chronic respiratory failure with hypoxia ([place]) 05/19/2016  . NSTEMI (non-ST elevated myocardial infarction) ([place]) 05/19/2016  . Acute metabolic encephalopathy 39/76/7341  . Diabetic ketoacidosis without coma associated with type 2 diabetes mellitus ([place])   . SOB (shortness of breath)   . Hyperlipidemia   . Class 3 obesity due to excess calories with serious comorbidity and body mass index (BMI) of 40.0 to 44.9 in adult [facility])   . Obesity hypoventilation syndrome ([place])   . Sepsis ([place]) 04/04/2016  . DKA, type 2 ([place]) 04/04/2016  . Influenza B   . Diastolic dysfunction 93/79/0240  . Diabetic neuropathy ([place]) 11/10/2014  . Chronic kidney disease (CKD), stage IV (severe) ([place]) 11/10/2014  . Fracture of distal femur ([place]) 07/08/2014  . Osteoporosis 07/08/2014  . Femur fracture, left ([place]) 07/08/2014  . Colles' fracture of left radius   . Left hip pain 05/03/2014  . Hip pain   . CAP (community acquired pneumonia) 07/25/2012  . Perimenopausal symptoms 02/13/2012  . Hyponatremia 12/29/2011  . Asthma exacerbation attacks 12/28/2011  . Bronchitis 12/28/2011  . Repeated falls 07/30/2011  . Hip fracture, left ([place])  07/16/2011  . Femoral neck fracture ([place]) 07/16/2011  . Dyslipidemia 12/23/2008  . THRUSH 05/20/2008  . Human immunodeficiency virus (HIV) disease ([place]) 04/19/2008  . Diabetes mellitus type 2 with complications, uncontrolled ([place]) 04/19/2008  . Mood disorder ([place]) 04/19/2008  . Hereditary and idiopathic peripheral neuropathy 04/19/2008  . Essential hypertension 04/19/2008    Orientation RESPIRATION BLADDER Height & Weight     Self, Place  O2 (Nasal Canula 2.5 L. Cpap: Respiratory rate (18), Flow rate (4).) Continent Weight: 245 lb 9.5 oz (111.4 kg) Height:  5\' 10"  (177.8 cm) (from March 2018 records)  BEHAVIORAL SYMPTOMS/MOOD NEUROLOGICAL BOWEL NUTRITION STATUS  Other (Comment) (Has been anxious, agitated, uncooperative.)  (None) Continent Diet (Heart healthy/carb modified)  AMBULATORY STATUS COMMUNICATION OF NEEDS Skin   Limited Assist Verbally Bruising                       Personal Care Assistance Level of Assistance  Bathing, Feeding, Dressing Bathing Assistance: Independent Feeding assistance: Independent Dressing Assistance: Limited assistance     Functional Limitations Info  Sight, Hearing, Speech Sight Info: Adequate Hearing Info: Adequate Speech Info: Adequate    SPECIAL CARE FACTORS FREQUENCY  PT (By licensed PT), Blood pressure, OT (By licensed OT)     PT Frequency: 5 x week OT Frequency: 5 x week            Contractures Contractures Info: Not present    Additional Factors Info  Code Status, Allergies, Psychotropic Code Status Info: Full Allergies Info: Cortizone-10 (Hydrocortizone) Psychotropic Info:  Mood Disorder: Haldol injection 0.5 mg IV every 6 hours prn, Ativan injection 0.5 IV every 12 hours prn.         Current Medications (05/23/2016):  This is the current hospital active medication list Current Facility-Administered Medications  Medication Dose Route Frequency Provider Last Rate Last Dose  . 0.9 %  sodium chloride infusion    Intravenous Continuous Barton Dubois, MD 50 mL/hr at 05/21/16 1957    . acetaminophen (TYLENOL) tablet 650 mg  650 mg Oral Q6H PRN Ivor Costa, MD   650 mg at 05/21/16 1152  . aspirin EC tablet 81 mg  81 mg Oral Daily Barton Dubois, MD   81 mg at 05/23/16 1047  . budesonide (PULMICORT) nebulizer solution 0.5 mg  0.5 mg Nebulization BID Barton Dubois, MD   0.5 mg at 05/23/16 0734  . doxycycline (VIBRA-TABS) tablet 100 mg  100 mg Oral Q12H Barton Dubois, MD   100 mg at 05/23/16 1047  . elvitegravir-cobicistat-emtricitabine-tenofovir (GENVOYA) 150-150-200-10 MG tablet 1 tablet  1 tablet Oral Q breakfast Barton Dubois, MD   1 tablet at 05/23/16 1046  . haloperidol lactate (HALDOL) injection 0.5 mg  0.5 mg Intravenous Q6H PRN Barton Dubois, MD   0.5 mg at 05/22/16 1402  . heparin ADULT infusion 100 units/mL (25000 units/225mL sodium chloride 0.45%)  1,400 Units/hr Intravenous Continuous Wynell Balloon, RPH 14 mL/hr at 05/23/16 0659 1,400 Units/hr at 05/23/16 0659  . hydrALAZINE (APRESOLINE) injection 5 mg  5 mg Intravenous Q2H PRN Ivor Costa, MD      . insulin aspart (novoLOG) injection 0-20 Units  0-20 Units Subcutaneous TID WC Barton Dubois, MD   3 Units at 05/23/16 1158  . insulin aspart (novoLOG) injection 0-5 Units  0-5 Units Subcutaneous QHS Barton Dubois, MD   2 Units at 05/21/16 2242  . insulin aspart (novoLOG) injection 5 Units  5 Units Subcutaneous TID WC Barton Dubois, MD      . insulin glargine (LANTUS) injection 50 Units  50 Units Subcutaneous BID Ivor Costa, MD   Stopped at 05/23/16 1000  . ipratropium (ATROVENT) nebulizer solution 0.5 mg  0.5 mg Nebulization TID Barton Dubois, MD   0.5 mg at 05/23/16 1333  . levalbuterol (XOPENEX) nebulizer solution 1.25 mg  1.25 mg Nebulization TID Barton Dubois, MD   1.25 mg at 05/23/16 1333  . LORazepam (ATIVAN) injection 0.5 mg  0.5 mg Intravenous Q12H PRN Barton Dubois, MD   0.5 mg at 05/22/16 1823  . nicotine (NICODERM CQ - dosed in  mg/24 hours) patch 21 mg  21 mg Transdermal Daily Ivor Costa, MD   21 mg at 05/23/16 1047  . nystatin (MYCOSTATIN) 100000 UNIT/ML suspension 500,000 Units  5 mL Oral QID Barton Dubois, MD   500,000 Units at 05/23/16 1331  . ondansetron (ZOFRAN) injection 4 mg  4 mg Intravenous Q8H PRN Ivor Costa, MD   4 mg at 05/21/16 2040  . pantoprazole (PROTONIX) EC tablet 40 mg  40 mg Oral Q1200 Barton Dubois, MD   40 mg at 05/23/16 1120  . predniSONE (DELTASONE) tablet 40 mg  40 mg Oral BID WC Barton Dubois, MD   40 mg at 05/23/16 0800  . rosuvastatin (CRESTOR) tablet 20 mg  20 mg Oral q1800 Barton Dubois, MD   20 mg at 05/21/16 1648     Discharge Medications: Please see discharge summary for a list of discharge medications.  Relevant Imaging Results:  Relevant Lab Results:   Additional Information SS#: [id number]. Restraints dc'ed  at 11:59 pm on 5/22. Currently has a Air cabin crew due to trying to crawl out of bed.  Candie Chroman, LCSW

## 2016-05-23 NOTE — Progress Notes (Signed)
Pt declined cpap tonight.  Pt stated it was too hot last night and doesn't want it tonight.  Pt was advised that RT is available all night should she change her mind.

## 2016-05-23 NOTE — Clinical Social Work Note (Signed)
Clinical Social Work Assessment  Patient Details  Name: [name] MRN: [id number] Date of Birth: 1965/09/02  Date of referral:  05/23/16               Reason for consult:  Facility Placement, Discharge Planning                Permission sought to share information with:  Facility Sport and exercise psychologist, Family Supports Permission granted to share information::  Yes, Verbal Permission Granted  Name::     Maricela Bo  Agency::  SNF's  Relationship::  Mother  Contact Information:  [phone]  Housing/Transportation Living arrangements for the past 2 months:  [place] of Information:  Medical Team, Parent Patient Interpreter Needed:  None Criminal Activity/Legal Involvement Pertinent to Current Situation/Hospitalization:  No - Comment as needed Significant Relationships:  Parents Lives with:  Parents Do you feel safe going back to the place where you live?  No Need for family participation in patient care:  Yes (Comment)  Care giving concerns:  PT recommending SNF once medically stable for discharge.   Social Worker assessment / plan:  Patient oriented to self and place only. No supports at bedside. CSW called patient's mother. CSW introduced role and explained that PT recommendations would be discussed. Patient's mother understands that the patient cannot make her own decisions at this time. Patient's mother is agreeable to SNF. First preference is [facility] because patient's mother has been there for rehab several times. Patient's mother began to cry, stating that the patient had been clean for 10 years. CSW provided emotional support regarding the current situation. The patient's mother is wheelchair bound and cannot care for her at home. CSW has notified admissions coordinator at [facility] that they are first preference. No further concerns. CSW encouraged patient's mother to contact CSW as needed. CSW will continue to follow patient and her mother for  support and facilitate discharge to SNF once medically stable and patient is sitter/restraint free for 24 hours.  Employment status:  Unemployed Nurse, adult PT Recommendations:  [place] / Referral to community resources:  [place]  Patient/Family's Response to care:  Patient not fully oriented. Patient's mother is agreeable to SNF placement. Patient's mother is supportive and involved in patient's care. Patient's mother appreciated social work intervention.  Patient/Family's Understanding of and Emotional Response to Diagnosis, Current Treatment, and Prognosis:  Patient not fully oriented. Patient's mother has a good understanding of the reason for admission and the patient's need for rehab prior to returning home. Patient's mother appears happy with hospital care.  Emotional Assessment Appearance:  Appears stated age Attitude/Demeanor/Rapport:  Unable to Assess Affect (typically observed):  Unable to Assess Orientation:  Oriented to Self, Oriented to Place Alcohol / Substance use:  Other (Not sure which substance she has used in the past) Psych involvement (Current and /or in the community):  No (Comment)  Discharge Needs  Concerns to be addressed:  Care Coordination Readmission within the last 30 days:  No Current discharge risk:  Cognitively Impaired, Dependent with Mobility Barriers to Discharge:  Ship broker, Continued Medical Work up, Requiring sitter/restraints   Candie Chroman, LCSW 05/23/2016, 4:08 PM

## 2016-05-23 NOTE — Progress Notes (Signed)
[facility]                                                                              Patient Demographics  [name], is a 51 y.o. female, DOB - September 11, 1965, ZJQ:[id number]  Admit date - 05/19/2016   Admitting Physician Ivor Costa, MD  Outpatient Primary MD for the patient is Hilbert Bible  Outpatient specialists:   LOS - 4  days    Chief Complaint  Patient presents with  . Weakness  . Shortness of Breath       Brief summary   51 year old female with medical history significant for hypertension, HIV, diabetes, asthma/COPD, GERD, depression, tobacco abuse, chronic kidney disease stage 3-4 and left BKA; who presented to the hospital secondary to acute respiratory failure with hypoxia and worsening wheezing. Patient with generalized weakness and altered mental status; found by EMS with oxygen saturation around 65-70% only oriented to person. CXR without infiltrates; patient was afebrile and with normal WBCs. Of note she was about 5 weeks ago admitted to [facility] secondary to community acquired pneumonia and influenza.   Assessment & Plan    Principal Problem:   Acute on chronic respiratory failure with hypoxia ([place]), possibly due to a asthma exacerbation, bronchitis, has morbid obesity with BMI of 35.9, possibly obesity hypoventilation/OSA -  Continue prednisone, Pulmicort, DuoNeb's, antibiotics - Continue flutter valve - will benefit from sleep study outpatient  -  lethargic today, obtain ABG - Obtain CT angiogram of the chest    Active problems  HIV-AIDS CD4 count 620 - Continue Genvoya, outpatient follow-up with ID  Elevated troponins, plateau at 1.21 - Risk factors of diabetes mellitus, hyperlipidemia, hypertension, tobacco abuse, morbid obesity - 2-D echo pending, cardiology consulted, recommended no ischemic workup at this time, likely due to #1 - Follow CT angiogram of the chest, if negative for PE, DC heparin  drip  Diabetes mellitus type 2, uncontrolled, with hypoglycemia this morning - A1c 9.8 - Continue sliding scale insulin, Lantus and NovoLog for meal coverage - Continue to hold oral hypoglycemics while inpatient  Acute on chronic CKD stage III - Currently stable, continue to hold nephrotoxic agents - Creatinine stable  Morbid obesity, BMI 34.8 - Once mentation is improved, will discuss about diet and weight control  Nicotine abuse - Continue NicoDerm patch  Hyperlipidemia Continue statins  Acute metabolic encephalopathy - TSH normal, RPR negative, B-12 normal Follow ABG - Minimize sedatives, benzodiazepines, pain medications - Obtain ABG  Hypokalemia   replaced   Code Status:  Full code  DVT prophylaxis:   heparin drip Family Communication: No family member at the bedside   Disposition Plan:   Time Spent in minutes   25 minutes  Procedures:  Right lower extremity duplex : Negative for any SVT DVT or Baker's cyst   2-D echo results pending  Consultants: Cardiology  Antimicrobials :    doxycycline 5/20 >   Medications  Scheduled Meds: . aspirin EC  81 mg Oral Daily  . budesonide (PULMICORT) nebulizer solution  0.5 mg Nebulization BID  . doxycycline  100 mg Oral Q12H  . elvitegravir-cobicistat-emtricitabine-tenofovir  1  tablet Oral Q breakfast  . insulin aspart  0-20 Units Subcutaneous TID WC  . insulin aspart  0-5 Units Subcutaneous QHS  . insulin aspart  5 Units Subcutaneous TID WC  . insulin glargine  50 Units Subcutaneous BID  . ipratropium  0.5 mg Nebulization TID  . levalbuterol  1.25 mg Nebulization TID  . nicotine  21 mg Transdermal Daily  . nystatin  5 mL Oral QID  . pantoprazole  40 mg Oral Q1200  . predniSONE  40 mg Oral BID WC  . rosuvastatin  20 mg Oral q1800   Continuous Infusions: . sodium chloride 50 mL/hr at 05/21/16 1957  . heparin 1,400 Units/hr (05/23/16 0659)  . potassium chloride 10 mEq (05/23/16 1158)   PRN  Meds:.acetaminophen, haloperidol lactate, hydrALAZINE, LORazepam, ondansetron   Antibiotics   Anti-infectives    Start     Dose/Rate Route Frequency Ordered Stop   05/22/16 1200  doxycycline (VIBRA-TABS) tablet 100 mg     100 mg Oral Every 12 hours 05/22/16 1149     05/22/16 0800  elvitegravir-cobicistat-emtricitabine-tenofovir (GENVOYA) 150-150-200-10 MG tablet 1 tablet     1 tablet Oral Daily with breakfast 05/21/16 1026     05/20/16 0030  doxycycline (VIBRAMYCIN) 100 mg in dextrose 5 % 250 mL IVPB  Status:  Discontinued     100 mg 125 mL/hr over 120 Minutes Intravenous Every 12 hours 05/20/16 0019 05/22/16 1149   05/19/16 2330  vancomycin (VANCOCIN) IVPB 1000 mg/200 mL premix     1,000 mg 200 mL/hr over 60 Minutes Intravenous  Once 05/19/16 2316 05/20/16 0114   05/19/16 2330  piperacillin-tazobactam (ZOSYN) IVPB 3.375 g     3.375 g 100 mL/hr over 30 Minutes Intravenous  Once 05/19/16 2316 05/20/16 0016        Subjective:   [name] was seen and examined today.  Sleepy and lethargic, sitter at the bedside, unable to obtain review of systems from the patient. Per the sitter, patient was confused, agitated and combative overnight.  no fever this morning.   Objective:   Vitals:   05/22/16 2258 05/23/16 0441 05/23/16 0731 05/23/16 0734  BP:  (!) 102/58    Pulse: 97 72    Resp: 18 15    Temp:  97.6 F (36.4 C)    TempSrc:  Oral    SpO2: 95% 97% 94% 92%  Weight:  111.4 kg (245 lb 9.5 oz)    Height:        Intake/Output Summary (Last 24 hours) at 05/23/16 1241 Last data filed at 05/23/16 1024  Gross per 24 hour  Intake           697.81 ml  Output                0 ml  Net           697.81 ml     Wt Readings from Last 3 Encounters:  05/23/16 111.4 kg (245 lb 9.5 oz)  04/06/16 112.5 kg (248 lb 0.3 oz)  03/05/16 113.4 kg (250 lb)     Exam  General: At the time of my examination, sleepy  HEENT:  PERRLA, EOMI, Anicteric Sclera, mucous membranes moist.    Neck: Supple,  difficult to appreciate JVD  Cardiovascular: S1 S2 auscultated, no rubs, murmurs or gallops. Regular rate and rhythm.  Respiratory: Bilateral expiratory wheezing with scattered rhonchi  Gastrointestinal: Obese, Soft, nontender, nondistended, + bowel sounds  Ext: no cyanosis clubbing, left BKA  Neuro: not  cooperating with exam   Skin: No rashes  Psych: sleepy   Data Reviewed:  I have personally reviewed following labs and imaging studies  Micro Results Recent Results (from the past 240 hour(s))  Culture, blood (Routine X 2) w Reflex to ID Panel     Status: None (Preliminary result)   Collection Time: 05/19/16 10:17 PM  Result Value Ref Range Status   Specimen Description BLOOD RIGHT ANTECUBITAL  Final   Special Requests   Final    BOTTLES DRAWN AEROBIC AND ANAEROBIC Blood Culture adequate volume   Culture NO GROWTH 4 DAYS  Final   Report Status PENDING  Incomplete  Culture, blood (Routine X 2) w Reflex to ID Panel     Status: None (Preliminary result)   Collection Time: 05/19/16 10:17 PM  Result Value Ref Range Status   Specimen Description BLOOD LEFT ANTECUBITAL  Final   Special Requests   Final    BOTTLES DRAWN AEROBIC AND ANAEROBIC Blood Culture adequate volume   Culture NO GROWTH 4 DAYS  Final   Report Status PENDING  Incomplete  MRSA PCR Screening     Status: None   Collection Time: 05/20/16  3:56 PM  Result Value Ref Range Status   MRSA by PCR NEGATIVE NEGATIVE Final    Comment:        The GeneXpert MRSA Assay (FDA approved for NASAL specimens only), is one component of a comprehensive MRSA colonization surveillance program. It is not intended to diagnose MRSA infection nor to guide or monitor treatment for MRSA infections.     Radiology Reports Ct Head Wo Contrast  Result Date: 05/20/2016 CLINICAL DATA:  Acute encephalopathy, weakness for 2 weeks EXAM: CT HEAD WITHOUT CONTRAST TECHNIQUE: Contiguous axial images were obtained from the  base of the skull through the vertex without intravenous contrast. COMPARISON:  None. FINDINGS: Brain: Mild superficial and central atrophy with minimal small vessel ischemic disease of periventricular white matter. No intra-axial mass, hemorrhage or midline. No extra-axial fluid collections. Vascular: Mild atherosclerosis of the carotid siphons no hyperdense vessels. Skull: Negative for fracture or focal lesion. Sinuses/Orbits: The visualized mastoids, paranasal sinuses, orbits and globes are unremarkable. Other: None. IMPRESSION: Mild atrophy with minimal small vessel ischemic disease of periventricular white matter. No acute intracranial appearing abnormality. Electronically Signed   By: Ashley Royalty M.D.   On: 05/20/2016 01:50   Dg Chest Port 1 View  Result Date: 05/20/2016 CLINICAL DATA:  Evaluate central line placement EXAM: PORTABLE CHEST 1 VIEW COMPARISON:  May 19, 2016 FINDINGS: A new right central line terminates near the caval atrial junction. No pneumothorax. Mild bibasilar atelectasis. No other acute abnormality or change. IMPRESSION: The new right central line is in good position.  No pneumothorax. Electronically Signed   By: Dorise Bullion III M.D   On: 05/20/2016 18:32   Dg Chest Port 1 View  Result Date: 05/19/2016 CLINICAL DATA:  Weakness EXAM: PORTABLE CHEST 1 VIEW COMPARISON:  04/25/2016 FINDINGS: The heart size and mediastinal contours are within normal limits. Both lungs are clear. Chronic bilateral healed sixth and seventh rib fractures. IMPRESSION: No active disease. Chronic bilateral sixth and seventh rib fractures. Electronically Signed   By: Ashley Royalty M.D.   On: 05/19/2016 23:06    Lab Data:  CBC:  Recent Labs Lab 05/19/16 2217 05/19/16 2243 05/21/16 0603 05/22/16 0425 05/23/16 0541  WBC 9.6  --  8.6 8.1 8.2  NEUTROABS 8.0*  --   --   --   --  HGB 13.7 16.3* 10.5* 10.7* 10.0*  HCT 45.8 48.0* 36.5 36.1 34.1*  MCV 91.6  --  89.7 87.6 88.1  PLT 340  --  317 313  568   Basic Metabolic Panel:  Recent Labs Lab 05/19/16 2217 05/19/16 2243 05/21/16 0603 05/22/16 0425 05/23/16 0614  NA 135 138 141 142 144  K 4.5 4.5 2.8* 3.1* 2.4*  CL 98* 98* 106 106 105  CO2 23  --  26 28 31   GLUCOSE 567* 563* 132* 143* 40*  BUN 19 26* 9 7 7   CREATININE 2.23* 1.90* 1.07* 0.91 0.97  CALCIUM 9.0  --  8.4* 8.8* 8.5*  MG  --   --   --   --  1.4*   GFR: Estimated Creatinine Clearance: 91.8 mL/min (by C-G formula based on SCr of 0.97 mg/dL). Liver Function Tests:  Recent Labs Lab 05/19/16 2217  AST 28  ALT 17  ALKPHOS 80  BILITOT 0.5  PROT 7.5  ALBUMIN 3.4*   No results for input(s): LIPASE, AMYLASE in the last 168 hours. No results for input(s): AMMONIA in the last 168 hours. Coagulation Profile:  Recent Labs Lab 05/20/16 0049  INR 1.06   Cardiac Enzymes:  Recent Labs Lab 05/20/16 0049 05/20/16 0647 05/20/16 1206  TROPONINI 0.83* 1.21* 0.85*   BNP (last 3 results) No results for input(s): PROBNP in the last 8760 hours. HbA1C: No results for input(s): HGBA1C in the last 72 hours. CBG:  Recent Labs Lab 05/22/16 1709 05/22/16 2132 05/23/16 0716 05/23/16 0741 05/23/16 1122  GLUCAP 146* 99 30* 128* 148*   Lipid Profile: No results for input(s): CHOL, HDL, LDLCALC, TRIG, CHOLHDL, LDLDIRECT in the last 72 hours. Thyroid Function Tests:  Recent Labs  05/22/16 1600  TSH 1.571   Anemia Panel:  Recent Labs  05/22/16 1600  VITAMINB12 784   Urine analysis:    Component Value Date/Time   COLORURINE YELLOW 05/19/2016 2232   APPEARANCEUR CLEAR 05/19/2016 2232   LABSPEC 1.022 05/19/2016 2232   PHURINE 5.0 05/19/2016 2232   GLUCOSEU >=500 (A) 05/19/2016 2232   GLUCOSEU 250 (A) 03/16/2015 1612   HGBUR SMALL (A) 05/19/2016 2232   BILIRUBINUR NEGATIVE 05/19/2016 2232   KETONESUR 5 (A) 05/19/2016 2232   PROTEINUR 30 (A) 05/19/2016 2232   UROBILINOGEN 0.2 03/16/2015 1612   NITRITE NEGATIVE 05/19/2016 2232   LEUKOCYTESUR  NEGATIVE 05/19/2016 2232     Ripudeep Rai M.D. [facility] 05/23/2016, 12:41 PM  Pager: [phone] Between 7am to 7pm - call Pager - [phone]  After 7pm go to [email or web address] - password [email or web address]  Call night coverage person covering after 7pm

## 2016-05-23 NOTE — Clinical Social Work Placement (Signed)
   CLINICAL SOCIAL WORK PLACEMENT  NOTE  Date:  05/23/2016  Patient Details  Name: [name] MRN: [id number] Date of Birth: January 04, 1965  Clinical Social Work is seeking post-discharge placement for this patient at the [place] level of care (*CSW will initial, date and re-position this form in  chart as items are completed):  Yes   Patient/family provided with [facility] Work Department's list of facilities offering this level of care within the geographic area requested by the patient (or if unable, by the patient's family).  Yes   Patient/family informed of their freedom to choose among providers that offer the needed level of care, that participate in Medicare, Medicaid or managed care program needed by the patient, have an available bed and are willing to accept the patient.  Yes   Patient/family informed of [facility] ownership interest in [facility] and [facility], as well as of the fact that they are under no obligation to receive care at these facilities.  PASRR submitted to EDS on 05/23/16     PASRR number received on       Existing PASRR number confirmed on 05/23/16     FL2 transmitted to all facilities in geographic area requested by pt/family on 05/23/16     FL2 transmitted to all facilities within larger geographic area on       Patient informed that his/her managed care company has contracts with or will negotiate with certain facilities, including the following:            Patient/family informed of bed offers received.  Patient chooses bed at       Physician recommends and patient chooses bed at      Patient to be transferred to   on  .  Patient to be transferred to facility by       Patient family notified on   of transfer.  Name of family member notified:        PHYSICIAN Please sign FL2     Additional Comment:    _______________________________________________ Candie Chroman, LCSW 05/23/2016,  4:12 PM

## 2016-05-23 NOTE — Progress Notes (Signed)
Hypoglycemic Event  CBG: 30  Treatment: D50 IV 50 mL  Symptoms: sleepings  Follow-up CBG: BOBO:[id number] CBG Result:128  Possible Reasons for Event: Inadequate meal intake  Comments/MD notified:Dr. Rai aware of this value.    Norva Karvonen

## 2016-05-23 NOTE — Consult Note (Signed)
Cardiology Consultation:   Patient ID: LABRENDA [name]; [id number]; 1965/07/26   Admit date: 05/19/2016 Date of Consult: 05/23/2016  Primary Care Provider: Michel Harrow, PA-C Primary Cardiologist: None   Patient Profile:   BRIELLAH [name] is a 51 y.o. female with a hx of left BKA (2016), PVD, HTN, HIV disease, DM, renal insufficiency, HLD, GERD who is being seen today for the evaluation of elevated troponins at the request of Dr. Tana Coast.  History of Present Illness:   [name] is a 51 y.o. woman with complicated PMHx that includes left BKA, PVD, HTN, HLD, tobacco use, DM, asthma who is admitted 5/19 with respiratory distress requiring BiPap now weaned to nasal cannula, altered mental status, and weakness.  She currently has a sitter in the room due to agitation and attempting to get out of bed yesterday.  She is currently very somnolent and only arouses to pain before immediately falling back asleep.  She is hypokalemic and hypoglycemic this morning. She is unable to provide much history other than that she presented to the hospital with difficulty breathing.  She denies any significant chest discomfort prior to or during her hospitalization.  Otherwise, history is obtained via chart review.  She had a mildly elevated troponin peak of 1.2 on admission with creatinine elevated to 2.23.  Her troponin levels trended down to 0.85 and her renal function has normalized.  Her D-Dimer was positive at 0.58.  Lower extremity doppler negative for DVT.  An ABG from admission showed hypercarbic respiratory acidosis.  She has a CTA chest and 2D Echo ordered to further evaluate.  These have been unable to be completed due to patient not being able to sit still long enough.  On repeat evaluation with attending, Dr. Gwenlyn Found, patient is now much more alert, interactive, and appropriate.  She reiterates presentation due to diffuculty breathing and a couple falls at home.  She denies any chest  pain.  Past Medical History:  Diagnosis Date  . Anxiety   . Arthritis    LEFT HIP  . Asthma    HOSPITALIZED WITH EXCERBATION OF ASTHMA - AND BRONCHITIS AND THE FLU DEC 2013  . Depression   . Diabetes mellitus    ON INSULIN AND ORAL MEDICATIONS  . GERD (gastroesophageal reflux disease)   . HIV infection ([place]) 2000  . Hypertension   . Neuropathy    NEUROPATHY HANDS AND FEET  . Pain    SEVERE PAIN LEFT HIP - HX OF LEFT HIP PINNING JULY 2013  . Shortness of breath    ALLERGIES ARE "ACTING UP"    Past Surgical History:  Procedure Laterality Date  . AMPUTATION Left 11/18/2014   Procedure: LEFT BELOW KNEE AMPUTATION;  Surgeon: Leandrew Koyanagi, MD;  Location: [facility];  Service: Orthopedics;  Laterality: Left;  . CAST APPLICATION Left 09/05/1738   Procedure: CAST APPLICATION (FIBERGLASS);  Surgeon: Latanya Maudlin, MD;  Location: [facility];  Service: Orthopedics;  Laterality: Left;  CLOSED REDUCTION OF LEFT COLLES FRACTURE WITH SHORT ARM CAST  . HARDWARE REMOVAL Left 05/05/2014   Procedure: HARDWARE REMOVAL LEFT HIP;  Surgeon: Latanya Maudlin, MD;  Location: [facility];  Service: Orthopedics;  Laterality: Left;  REMOVAL BIOMET 6.5-8.0 CANNULATED SCREW  . HIP ARTHROPLASTY Left 05/05/2014   Procedure: ARTHROPLASTY OPEN REDUCTION INTERNAL FIXATION LEFT HIP AND REMOVAL OF TWO CANNULATED SCREW;  Surgeon: Latanya Maudlin, MD;  Location: [facility];  Service: Orthopedics;  Laterality: Left;  . HIP PINNING,CANNULATED  07/16/2011  Procedure: CANNULATED HIP PINNING;  Surgeon: Gearlean Alf, MD;  Location: [facility];  Service: Orthopedics;  Laterality: Left;  . HIP PINNING,CANNULATED Left 04/09/2012   Procedure: CANNULATED HIP PINNING AND HARDWARE REVISION;  Surgeon: Gearlean Alf, MD;  Location: [facility];  Service: Orthopedics;  Laterality: Left;  . I&D EXTREMITY Left 11/16/2014   Procedure:  DEBRIDEMENT OF LEFT FOOT POSSIBLE APPLICATION OF INTEGRIA AND VAC ;  Surgeon: Irene Limbo, MD;  Location: [facility];  Service:  Plastics;  Laterality: Left;  . PERIPHERAL VASCULAR CATHETERIZATION N/A 11/15/2014   Procedure: Abdominal Aortogram;  Surgeon: Conrad Woxall, MD;  Location: [facility];  Service: Cardiovascular;  Laterality: N/A;  . PERIPHERAL VASCULAR CATHETERIZATION  11/15/2014   Procedure: Lower Extremity Angiography;  Surgeon: Conrad Petrolia, MD;  Location: [facility];  Service: Cardiovascular;;  . PERIPHERAL VASCULAR CATHETERIZATION Left 11/15/2014   Procedure: Peripheral Vascular Intervention;  Surgeon: Conrad Comptche, MD;  Location: [facility];  Service: Cardiovascular;  Laterality: Left;  popliteal artery stenting     Inpatient Medications: Scheduled Meds: . aspirin EC  81 mg Oral Daily  . budesonide (PULMICORT) nebulizer solution  0.5 mg Nebulization BID  . doxycycline  100 mg Oral Q12H  . elvitegravir-cobicistat-emtricitabine-tenofovir  1 tablet Oral Q breakfast  . insulin aspart  0-20 Units Subcutaneous TID WC  . insulin aspart  0-5 Units Subcutaneous QHS  . insulin aspart  5 Units Subcutaneous TID WC  . insulin glargine  50 Units Subcutaneous BID  . ipratropium  0.5 mg Nebulization TID  . levalbuterol  1.25 mg Nebulization TID  . nicotine  21 mg Transdermal Daily  . nystatin  5 mL Oral QID  . pantoprazole  40 mg Oral Q1200  . predniSONE  40 mg Oral BID WC  . rosuvastatin  20 mg Oral q1800   Continuous Infusions: . sodium chloride 50 mL/hr at 05/21/16 1957  . heparin 1,400 Units/hr (05/23/16 0659)  . potassium chloride 10 mEq (05/23/16 0822)   PRN Meds: acetaminophen, haloperidol lactate, hydrALAZINE, LORazepam, ondansetron  Allergies:    Allergies  Allergen Reactions  . Cortizone-10 [Hydrocortisone] Rash    Per patient, given local injection at knee and developed rash at local site.     Social History:   Social History   Social History  . Marital status: Legally Separated    Spouse name: N/A  . Number of children: N/A  . Years of education: N/A    Occupational History  . Not on file.   Social History Main Topics  . Smoking status: Current Every Day Smoker    Packs/day: 0.50    Years: 35.00    Types: E-cigarettes, Cigarettes  . Smokeless tobacco: Never Used  . Alcohol use No     Comment: no drink since 2008  . Drug use: No     Comment: hx of crack/cocaine, clean 8 years  . Sexual activity: No   Other Topics Concern  . Not on file   Social History Narrative  . No narrative on file    Family History:   The patient's family history includes Diabetes in her mother; Heart disease in her mother; Hypertension in her father and mother; Vision loss in her mother. There is no history of Thyroid disease.  ROS:  Please see the history of present illness.  All other ROS reviewed and negative.     Physical Exam/Data:   Vitals:   05/22/16 2258 05/23/16 7124 05/23/16 5809  05/23/16 0734  BP:  (!) 102/58    Pulse: 97 72    Resp: 18 15    Temp:  97.6 F (36.4 C)    TempSrc:  Oral    SpO2: 95% 97% 94% 92%  Weight:  245 lb 9.5 oz (111.4 kg)    Height:        Intake/Output Summary (Last 24 hours) at 05/23/16 0859 Last data filed at 05/23/16 0659  Gross per 24 hour  Intake           577.81 ml  Output                0 ml  Net           577.81 ml   Filed Weights   05/20/16 0000 05/22/16 0426 05/23/16 0441  Weight: 250 lb (113.4 kg) 242 lb 15.2 oz (110.2 kg) 245 lb 9.5 oz (111.4 kg)   Body mass index is 35.24 kg/m.  General:  Obese, Caucasian woman, somnolent, arouses to pain.  On reexamination, she is alert and oriented, sitting up in bed. HEENT: [place]/AT, EOMI, PERRLA Neck: JVD difficult to assess due to habitus, Right IJ in place Vascular: No carotid bruits, right lower extremity warm and well perfused Cardiac:  normal S1, S2; RRR; soft murmur at the LUSB Lungs:  On nasal cannula, + wheezes, no respiratory distress Abd: soft, nontender, non-distended, + BS  Ext: no edema, Left BKA Skin: warm and dry, upper extremity  bruising Neuro:  CNs 2-12 intact, no focal abnormalities noted Psych:  Normal affect on reexamination    EKG:  The EKG was personally reviewed and demonstrates sinus tach with HR 103, T-wave inversion of anterior and inferior leads  Relevant CV Studies: Echo This Admission: Pending  Vascular US Lower Extremity 05/21/2016: - No evidence of deep vein or superficial thrombosis involving the   right lower extremity and left common femoral vein. - No evidence of Baker&'s cyst on the right.  Echo 2016: - Left ventricle: The cavity size was normal. There was mild focal   basal hypertrophy of the septum. Systolic function was normal.   The estimated ejection fraction was in the range of 60% to 65%.   Wall motion was normal; there were no regional wall motion   abnormalities. Doppler parameters are consistent with abnormal   left ventricular relaxation (grade 1 diastolic dysfunction). - Mitral valve: Transvalvular velocity was within the normal range.   There was no evidence for stenosis. There was no regurgitation. - Right ventricle: The cavity size was mildly dilated. Wall   thickness was normal. Systolic function was normal. - Tricuspid valve: There was no regurgitation. - Inferior vena cava: The vessel was normal in size. The   respirophasic diameter changes were in the normal range (= 50%),   consistent with normal central venous pressure.   Laboratory Data:  Chemistry Recent Labs Lab 05/21/16 0603 05/22/16 0425 05/23/16 0614  NA 141 142 144  K 2.8* 3.1* 2.4*  CL 106 106 105  CO2 26 28 31   GLUCOSE 132* 143* 40*  BUN 9 7 7   CREATININE 1.07* 0.91 0.97  CALCIUM 8.4* 8.8* 8.5*  GFRNONAA 59* >60 >60  GFRAA >60 >60 >60  ANIONGAP 9 8 8      Recent Labs Lab 05/19/16 2217  PROT 7.5  ALBUMIN 3.4*  AST 28  ALT 17  ALKPHOS 80  BILITOT 0.5   Hematology Recent Labs Lab 05/21/16 0603 05/22/16 0425 05/23/16 0541  WBC 8.6  8.1 8.2  RBC 4.07 4.12 3.87  HGB 10.5* 10.7*  10.0*  HCT 36.5 36.1 34.1*  MCV 89.7 87.6 88.1  MCH 25.8* 26.0 25.8*  MCHC 28.8* 29.6* 29.3*  RDW 17.7* 17.2* 17.4*  PLT 317 313 282   Cardiac Enzymes Recent Labs Lab 05/20/16 0049 05/20/16 0647 05/20/16 1206  TROPONINI 0.83* 1.21* 0.85*    Recent Labs Lab 05/19/16 2241  TROPIPOC 0.55*    BNP Recent Labs Lab 05/19/16 2217  BNP 520.0*    DDimer  Recent Labs Lab 05/20/16 0413  DDIMER 0.58*    Radiology/Studies:  Ct Head Wo Contrast  Result Date: 05/20/2016 CLINICAL DATA:  Acute encephalopathy, weakness for 2 weeks EXAM: CT HEAD WITHOUT CONTRAST TECHNIQUE: Contiguous axial images were obtained from the base of the skull through the vertex without intravenous contrast. COMPARISON:  None. FINDINGS: Brain: Mild superficial and central atrophy with minimal small vessel ischemic disease of periventricular white matter. No intra-axial mass, hemorrhage or midline. No extra-axial fluid collections. Vascular: Mild atherosclerosis of the carotid siphons no hyperdense vessels. Skull: Negative for fracture or focal lesion. Sinuses/Orbits: The visualized mastoids, paranasal sinuses, orbits and globes are unremarkable. Other: None. IMPRESSION: Mild atrophy with minimal small vessel ischemic disease of periventricular white matter. No acute intracranial appearing abnormality. Electronically Signed   By: Ashley Royalty M.D.   On: 05/20/2016 01:50   Dg Chest Port 1 View  Result Date: 05/20/2016 CLINICAL DATA:  Evaluate central line placement EXAM: PORTABLE CHEST 1 VIEW COMPARISON:  May 19, 2016 FINDINGS: A new right central line terminates near the caval atrial junction. No pneumothorax. Mild bibasilar atelectasis. No other acute abnormality or change. IMPRESSION: The new right central line is in good position.  No pneumothorax. Electronically Signed   By: Dorise Bullion III M.D   On: 05/20/2016 18:32   Dg Chest Port 1 View  Result Date: 05/19/2016 CLINICAL DATA:  Weakness EXAM: PORTABLE CHEST  1 VIEW COMPARISON:  04/25/2016 FINDINGS: The heart size and mediastinal contours are within normal limits. Both lungs are clear. Chronic bilateral healed sixth and seventh rib fractures. IMPRESSION: No active disease. Chronic bilateral sixth and seventh rib fractures. Electronically Signed   By: Ashley Royalty M.D.   On: 05/19/2016 23:06    Assessment and Plan:   1. Elevated Troponin: she has experienced a small elevation in her troponin with cardiac risk factors of HTN, HLD, and tobacco use.  This was in the setting of acute hypoxic and hypercarbic respiratory failure requiring BiPap with initial SpO2 documented in the 60-70% range.  Also she had acute renal insufficiency on admission with serum creatinine of 2.2.  Her EKG does show some changes in the inferior and anterior leads when compared to previous, however, this can likely be explained in the setting of her hypoxia and demand ischemia.  She denies any current or history of chest pain prior to admission.  Would assess her LV function and for any regional wall-motion abnormalities via 2-D echo.  Further plan pending Echo results, however, if normal would not pursue invasive cardiac catheterization based on her presentation.  Can likely discontinue her heparin drip at this point.  2.  Diastolic CHF: previous Echo noted preserved LV EF with only grade 1 diastolic dysfunction.  Lasix 80mg  daily listed as a home medication.  Not receiving diuretics this admission.  No physical or radiographic evidence of acute exacerbation other than abnormal BNP marker.  She has received IV fluids throughout her stay at various points.  Will check another BNP.  3.  Acute Hypoxic and Hypercarbic Respiratory Failure: likely secondary to asthma exacerbation.  Management per primary service.  4. Hypokalemia/Hypomagensium: K+ 2.4 and mag 1.4 this morning. Potassium replacement per primary service.  Will order for 2grams of magnesium as well.   5. Renal Insufficiency    6. DM  7. HTN: home meds being held  8. HLD: on statin    Signed, Jule Ser, DO  05/23/2016 8:59 AM  Agree with note by Dr. Jule Ser.  Unfortunate 51 year old moderate to severely overweight Caucasian female with a history of HIV, peripheral vascular disease status post left BKA, reactive airways disease, diastolic heart failure, diabetes and hypertension as well as hyperlipidemia admitted with hypoxic respiratory distress. She was placed on BiPAP initially. Her serum creatinine was initially elevated. 2-D echo in the past has shown normal LV function with diastolic dysfunction. Her troponins rose mildly. She denied chest pain. Her exam is benign. I do not think that this represents ACS. I think her mild troponin leak could be related to demand ischemia especially with hypoxia and renal insufficiency. I agree with rechecking a 2-D echo. I do not think a functional study or an invasive approach is warranted at this time.  Lorretta Harp, M.D., [place], [facility], [place] [address]. [place], [place]  [address]  [phone] 05/23/2016 11:22 AM

## 2016-05-23 NOTE — Progress Notes (Signed)
ANTICOAGULATION CONSULT NOTE - Follow Up Consult  Pharmacy Consult:  Heparin Indication: chest pain/ACS  Allergies  Allergen Reactions  . Cortizone-10 [Hydrocortisone] Rash    Per patient, given local injection at knee and developed rash at local site.     Patient Measurements: Height: 5\' 10"  (177.8 cm) (from March 2018 records) Weight: 245 lb 9.5 oz (111.4 kg) IBW/kg (Calculated) : 68.5 Heparin Dosing Weight: 95 kg  Vital Signs: Temp: 97.6 F (36.4 C) (05/23 0441) Temp Source: Oral (05/23 0441) BP: 102/58 (05/23 0441) Pulse Rate: 72 (05/23 0441)  Labs:  Recent Labs  05/20/16 1206  05/21/16 0603 05/21/16 1125 05/22/16 0425 05/23/16 0541 05/23/16 0614  HGB  --   < > 10.5*  --  10.7* 10.0*  --   HCT  --   --  36.5  --  36.1 34.1*  --   PLT  --   --  317  --  313 282  --   HEPARINUNFRC  --   < > <0.10* 0.53 0.69 0.69  --   CREATININE  --   --  1.07*  --  0.91  --  0.97  TROPONINI 0.85*  --   --   --   --   --   --   < > = values in this interval not displayed.  Estimated Creatinine Clearance: 91.8 mL/min (by C-G formula based on SCr of 0.97 mg/dL).    Assessment: 34 YOF to continue on IV heparin for ACS.   Heparin level therapeutic this am but on higher end of range (even with dose decrease yesterday)  Cbc stable  Goal of Therapy:  Heparin level 0.3-0.7 units/ml Monitor platelets by anticoagulation protocol: Yes   Plan:  Continue heparin 1400 units / hr Daily heparin level and CBC  Levester Fresh, PharmD, BCPS, BCCCP Clinical Pharmacist Clinical phone for 05/23/2016 from 7a-3:30p: [phone] If after 3:30p, please call main pharmacy at: [phone] 05/23/2016 9:32 AM

## 2016-05-23 NOTE — Evaluation (Signed)
Occupational Therapy Evaluation Patient Details Name: [name] MRN: [id number] DOB: 1965-05-10 Today's Date: 05/23/2016    History of Present Illness Patient is a 52 y/o female admitted with acute respiratory failure with hypoxia, worsening wheezing, generalized weakness and AMS; found by EMS with 02 around 65-70% only oriented to person. Pt found to have elevated troponin and elevated d-dimer level most likely demand ischemia Vs worsening renal function. PMH includes HIV, DM with retinopathy and peripheral neuropathy, PVD, Left BKA, HTN, depression.   Clinical Impression   PT admitted with AMS with elevated troponin. Pt currently with functional limitiations due to the deficits listed below (see OT problem list). PTA living with mother whom is requesting placement in the community. Pt with cognitive deficits and requires (A) for basic adls. Pt will benefit from skilled OT to increase their independence and safety with adls and balance to allow discharge SNF. Pt high fall risk at this time due to cognitive deficits.       Follow Up Recommendations  SNF;Supervision/Assistance - 24 hour    Equipment Recommendations  Other (comment) (defer to  SNF)    Recommendations for Other Services       Precautions / Restrictions Precautions Precautions: Fall Precaution Comments: left BKA      Mobility Bed Mobility Overal bed mobility: Modified Independent             General bed mobility comments: hOB elevated and use of bed rail  Transfers Overall transfer level: Needs assistance Equipment used: Rolling walker (2 wheeled) Transfers: Sit to/from Stand Sit to Stand: Min assist         General transfer comment: pt pulling up on RW. pt without L LE don and attempting to static stand. Pt needs cues for safety    Balance Overall balance assessment: Needs assistance Sitting-balance support: Bilateral upper extremity supported;Feet supported Sitting balance-Leahy Scale:  Fair     Standing balance support: Bilateral upper extremity supported;During functional activity Standing balance-Leahy Scale: Fair Standing balance comment: reliant on RW                           ADL either performed or assessed with clinical judgement   ADL Overall ADL's : Needs assistance/impaired Eating/Feeding: Independent   Grooming: Independent   Upper Body Bathing: Independent           Lower Body Dressing: Moderate assistance Lower Body Dressing Details (indicate cue type and reason): pt able to don sleeve for prosthetic and needed (A) for don of actual leg. Pt with "pin" slightly off center and lacks awareness to redon sleeve properly. Pt repeatingly attempting to place prosthetic on with poor alignment.  Toilet Transfer: Minimal assistance;RW             General ADL Comments: pt with decr BP with transfer and no symptoms.      Vision         Perception     Praxis      Pertinent Vitals/Pain Pain Assessment: No/denies pain     Hand Dominance Right   Extremity/Trunk Assessment Upper Extremity Assessment Upper Extremity Assessment: Overall WFL for tasks assessed   Lower Extremity Assessment Lower Extremity Assessment: Defer to PT evaluation   Cervical / Trunk Assessment Cervical / Trunk Assessment:  (hx of back surg)   Communication Communication Communication: No difficulties   Cognition Arousal/Alertness: Awake/alert Behavior During Therapy: Flat affect Overall Cognitive Status: Impaired/Different from baseline Area of Impairment: Safety/judgement;Awareness  Safety/Judgement: Decreased awareness of safety;Decreased awareness of deficits Awareness: Emergent   General Comments: pt slow processing, pt asking questions not related to session and due to poor awareness to staff "oh it was the other girl then" Pt impulsive standing and needed cues for safety   General Comments  BP 83/65 and then  with feet elevated 98/74 oxygen 96% 3 L    Exercises     Shoulder Instructions      Home Living Family/patient expects to be discharged to:: Private residence Living Arrangements: Parent Available Help at Discharge: Family;Available 24 hours/day Type of Home: House Home Access: Stairs to enter [employer] of Steps: 3 Entrance Stairs-Rails: Right;Left Home Layout: One level     Bathroom Shower/Tub: Occupational psychologist: Standard Bathroom Accessibility: Yes   Home Equipment: Environmental consultant - 2 wheels;Cane - single point;Wheelchair - [employer];Shower seat   Additional Comments: information obtained from PT note and vertified by pt      Prior Functioning/Environment Level of Independence: Independent with assistive device(s)        Comments: Ambulates with RW or manual w/c.        OT Problem List: Decreased strength;Decreased activity tolerance;Impaired balance (sitting and/or standing);Decreased cognition;Decreased safety awareness;Decreased knowledge of use of DME or AE;Decreased knowledge of precautions;Obesity;Cardiopulmonary status limiting activity      OT Treatment/Interventions: Self-care/ADL training;Therapeutic exercise;DME and/or AE instruction;Therapeutic activities;Cognitive remediation/compensation;Patient/family education;Balance training    OT Goals(Current goals can be found in the care plan section) Acute Rehab OT Goals Patient Stated Goal: watch game show OT Goal Formulation: With patient Time For Goal Achievement: 06/06/16 Potential to Achieve Goals: Good  OT Frequency: Min 2X/week   Barriers to D/C: Decreased caregiver support  per notes mother requesting CM for placement       Co-evaluation              AM-PAC PT "6 Clicks" Daily Activity     Outcome Measure Help from another person eating meals?: None Help from another person taking care of personal grooming?: A Little Help from another person toileting,  which includes using toliet, bedpan, or urinal?: A Little Help from another person bathing (including washing, rinsing, drying)?: A Little Help from another person to put on and taking off regular upper body clothing?: A Little Help from another person to put on and taking off regular lower body clothing?: A Little 6 Click Score: 19   End of Session Equipment Utilized During Treatment: Gait belt;Rolling walker;Oxygen Nurse Communication: Mobility status;Precautions  Activity Tolerance: Patient tolerated treatment well Patient left: in chair;with call bell/phone within reach;with chair alarm set;with nursing/sitter in room  OT Visit Diagnosis: Unsteadiness on feet (R26.81)                Time: 8101-7510 OT Time Calculation (min): 23 min Charges:  OT General Charges $OT Visit: 1 Procedure OT Evaluation $OT Eval Moderate Complexity: 1 Procedure G-Codes:      Jeri Modena   OTR/L Pager: [phone] Office: [phone] .   Parke Poisson B 05/23/2016, 1:07 PM

## 2016-05-23 NOTE — Progress Notes (Signed)
Inpatient Diabetes Program Recommendations  AACE/ADA: New Consensus Statement on Inpatient Glycemic Control (2015)  Target Ranges:  Prepandial:   less than 140 mg/dL      Peak postprandial:   less than 180 mg/dL (1-2 hours)      Critically ill patients:  140 - 180 mg/dL   Results for [name], [name] (MRN [id number]) as of 05/23/2016 14:20  Ref. Range 05/22/2016 07:17 05/22/2016 12:06 05/22/2016 17:09 05/22/2016 21:32 05/23/2016 07:16 05/23/2016 07:41 05/23/2016 11:22  Glucose-Capillary Latest Ref Range: 65 - 99 mg/dL 100 (H)  Lantus 50 units 174 (H)  Novolog 4 units 146 (H)  Novolog 4 units 99 30 (LL) 128 (H) 148 (H)  Novolog 3 units  Results for [name], [name] (MRN [id number]) as of 05/23/2016 14:20  Ref. Range 05/20/2016 08:38 05/20/2016 12:28 05/20/2016 14:08 05/20/2016 18:33 05/20/2016 21:08 05/21/2016 08:46 05/21/2016 12:49 05/21/2016 16:42 05/21/2016 20:43  Glucose-Capillary Latest Ref Range: 65 - 99 mg/dL 418 (H)  Novolog 15 units  Lantus 50 units 347 (H) 245 (H) 176 (H)  Novolog 2 units 156 (H)  Lantus 50 units 109 (H)  Lantus 50 units 319 (H)  Novolog 15 units 273 (H)  Novolog 7 units 202 (H)  Novolog 2 units   Review of Glycemic Control  Diabetes history: DM2 Outpatient Diabetes medications: Humulin R U500 (concentrated insulin) 60 units with breakfast, 50 units with lunch, and 35 units with supper Current orders for Inpatient glycemic control: Lantus 50 units BID, Novolog 0-20 units TID with meals, Novolog 0-5 units QHS, Novolog 5 units TID with meals  Inpatient Diabetes Program Recommendations: Insulin - Basal: In reviewing the chart, noted patient did NOT receive Lantus 50 units last night and fasting glucose was 30 mg/dl at 7:16 am today. Patient did NOT receive any Lantus this morning. Unclear why patient is now experiencing hypoglycemia as she received Lantus 50 units BID on 5/20 and 5/21 and did not have any issues with hypoglycemia. May need to decrease Lantus and  adjust as more data is collected.  Thanks, Barnie Alderman, RN, MSN, CDE Diabetes Coordinator Inpatient Diabetes Program [phone] (Team Pager from 8am to 5pm)

## 2016-05-23 NOTE — Progress Notes (Signed)
CRITICAL VALUE ALERT  Critical Value:  Potassium 2.4, Glucose 40  Date & Time Notied:  05/22/2016, 1572  Provider Notified: Dr. Tana Coast  Orders Received/Actions taken: IV Potassium ordered, labs ordered. Glucose was already treated.

## 2016-05-23 NOTE — Care Management Important Message (Signed)
Important Message  Patient Details  Name: [name] MRN: [id number] Date of Birth: 26-May-1965   Medicare Important Message Given:  Yes    Solina Heron Abena 05/23/2016, 10:11 AM

## 2016-05-23 NOTE — Progress Notes (Signed)
Notified by central telemetry of patient having an 8 beat run of V tach. Notified MD. Will continue to monitor.

## 2016-05-23 NOTE — Progress Notes (Signed)
Physical Therapy Treatment Patient Details Name: [name] MRN: [id number] DOB: 1965/07/09 Today's Date: 05/23/2016    History of Present Illness Patient is a 51 y/o female admitted with acute respiratory failure with hypoxia, worsening wheezing, generalized weakness and AMS; found by EMS with 02 around 65-70% only oriented to person. Pt found to have elevated troponin and elevated d-dimer level most likely demand ischemia Vs worsening renal function. PMH includes HIV, DM with retinopathy and peripheral neuropathy, PVD, Left BKA, HTN, depression.    PT Comments    Patient tolerated short distance gait in room this session with assistance. Pt demonstrated cognitive deficits and unable to safely complete functional transfers without assistance. Pt's mother is concerned about ability to provide level of care pt requires at this time as she is a high fall risk. Recommending SNF for further skilled PT services to maximize independence and safety with mobility.   Follow Up Recommendations  SNF;Supervision/Assistance - 24 hour     Equipment Recommendations  None recommended by PT    Recommendations for Other Services       Precautions / Restrictions Precautions Precautions: Fall Precaution Comments: left BKA    Mobility  Bed Mobility Overal bed mobility: Modified Independent             General bed mobility comments: increased effort; use of rail and HOB elevated  Transfers Overall transfer level: Needs assistance Equipment used: Rolling walker (2 wheeled) Transfers: Sit to/from Stand Sit to Stand: Min assist         General transfer comment: cues for safety and use of AD; pt attempted to stand without RW and without having prosthesis securely donned; X2 from EOB with assist to power up into standing   Ambulation/Gait Ambulation/Gait assistance: Min assist;+2 safety/equipment Ambulation Distance (Feet): 10 Feet Assistive device: Rolling walker (2 wheeled) Gait  Pattern/deviations: Step-through pattern;Decreased stride length;Decreased dorsiflexion - right;Decreased dorsiflexion - left;Trunk flexed;Narrow base of support     General Gait Details: cues for posture and safe use of RW; assist to steady and to manage RW as pt had difficulty negotiating objects in room   Stairs            Wheelchair Mobility    Modified Rankin (Stroke Patients Only)       Balance Overall balance assessment: Needs assistance Sitting-balance support: Bilateral upper extremity supported;Feet supported Sitting balance-Leahy Scale: Fair     Standing balance support: Bilateral upper extremity supported;During functional activity Standing balance-Leahy Scale: Fair Standing balance comment: reliant on RW                            Cognition Arousal/Alertness: Awake/alert Behavior During Therapy: Flat affect Overall Cognitive Status: Impaired/Different from baseline Area of Impairment: Safety/judgement;Awareness                         Safety/Judgement: Decreased awareness of safety;Decreased awareness of deficits Awareness: Emergent   General Comments: pt slow processing, pt asking questions not related to session and due to poor awareness to staff "oh it was the other girl then" Pt impulsive standing and needed cues for safety      Exercises      General Comments General comments (skin integrity, edema, etc.): BP in sitting end of session 83/65 and then with legs elevated in recliner 98/74; other vitals WNL; pt on 3L O2 via [place] throughout session      Pertinent Vitals/Pain Pain  Assessment: No/denies pain    Home Living Family/patient expects to be discharged to:: Private residence Living Arrangements: Parent Available Help at Discharge: Family;Available 24 hours/day Type of Home: House Home Access: Stairs to enter Entrance Stairs-Rails: Right;Left Home Layout: One level Home Equipment: Environmental consultant - 2 wheels;Cane - single  point;Wheelchair - [employer];Shower seat Additional Comments: information obtained from PT note and vertified by pt    Prior Function Level of Independence: Independent with assistive device(s)      Comments: Ambulates with RW or manual w/c.   PT Goals (current goals can now be found in the care plan section) Acute Rehab PT Goals Patient Stated Goal: watch game show Progress towards PT goals: Progressing toward goals    Frequency    Min 3X/week      PT Plan Discharge plan needs to be updated    Co-evaluation PT/OT/SLP Co-Evaluation/Treatment: Yes Reason for Co-Treatment: To address functional/ADL transfers;Necessary to address cognition/behavior during functional activity PT goals addressed during session: Mobility/safety with mobility;Proper use of DME;Balance        AM-PAC PT "6 Clicks" Daily Activity  Outcome Measure  Difficulty turning over in bed (including adjusting bedclothes, sheets and blankets)?: A Little Difficulty moving from lying on back to sitting on the side of the bed? : Total Difficulty sitting down on and standing up from a chair with arms (e.g., wheelchair, bedside commode, etc,.)?: Total Help needed moving to and from a bed to chair (including a wheelchair)?: A Little Help needed walking in hospital room?: A Little Help needed climbing 3-5 steps with a railing? : A Lot 6 Click Score: 13    End of Session Equipment Utilized During Treatment: Gait belt;Oxygen Activity Tolerance: Patient tolerated treatment well Patient left: in chair;with call bell/phone within reach;with chair alarm set Nurse Communication: Mobility status PT Visit Diagnosis: Unsteadiness on feet (R26.81);Muscle weakness (generalized) (M62.81);Other (comment)     Time: 7322-0254 PT Time Calculation (min) (ACUTE ONLY): 25 min  Charges:  $Gait Training: 8-22 mins                    G Codes:       Earney Navy, PTA Pager: [phone]     Darliss Cheney 05/23/2016, 1:50 PM

## 2016-05-24 ENCOUNTER — Inpatient Hospital Stay (HOSPITAL_COMMUNITY): Payer: Medicare HMO

## 2016-05-24 ENCOUNTER — Inpatient Hospital Stay: Payer: Medicare HMO | Admitting: Infectious Diseases

## 2016-05-24 DIAGNOSIS — R0602 Shortness of breath: Secondary | ICD-10-CM

## 2016-05-24 DIAGNOSIS — R072 Precordial pain: Secondary | ICD-10-CM

## 2016-05-24 LAB — GLUCOSE, CAPILLARY
GLUCOSE-CAPILLARY: 191 mg/dL — AB (ref 65–99)
GLUCOSE-CAPILLARY: 283 mg/dL — AB (ref 65–99)
Glucose-Capillary: 304 mg/dL — ABNORMAL HIGH (ref 65–99)
Glucose-Capillary: 97 mg/dL (ref 65–99)

## 2016-05-24 LAB — CULTURE, BLOOD (ROUTINE X 2)
CULTURE: NO GROWTH
Culture: NO GROWTH
Special Requests: ADEQUATE
Special Requests: ADEQUATE

## 2016-05-24 LAB — BASIC METABOLIC PANEL
ANION GAP: 8 (ref 5–15)
BUN: 8 mg/dL (ref 6–20)
CHLORIDE: 100 mmol/L — AB (ref 101–111)
CO2: 29 mmol/L (ref 22–32)
Calcium: 8.5 mg/dL — ABNORMAL LOW (ref 8.9–10.3)
Creatinine, Ser: 0.97 mg/dL (ref 0.44–1.00)
GFR calc Af Amer: >60 mL/min (ref >60)
GLUCOSE: 277 mg/dL — AB (ref 65–99)
POTASSIUM: 3.1 mmol/L — AB (ref 3.5–5.1)
Sodium: 137 mmol/L (ref 135–145)

## 2016-05-24 LAB — CBC
HCT: 34.7 % — ABNORMAL LOW (ref 36.0–46.0)
Hemoglobin: 10.2 g/dL — ABNORMAL LOW (ref 12.0–15.0)
MCH: 25.4 pg — ABNORMAL LOW (ref 26.0–34.0)
MCHC: 29.4 g/dL — ABNORMAL LOW (ref 30.0–36.0)
MCV: 86.3 fL (ref 78.0–100.0)
Platelets: 256 10*3/uL (ref 150–400)
RBC: 4.02 MIL/uL (ref 3.87–5.11)
RDW: 17.2 % — ABNORMAL HIGH (ref 11.5–15.5)
WBC: 5.2 10*3/uL (ref 4.0–10.5)

## 2016-05-24 LAB — ECHOCARDIOGRAM COMPLETE
HEIGHTINCHES: 70 in
WEIGHTICAEL: 3851.88 [oz_av]

## 2016-05-24 LAB — HEPARIN LEVEL (UNFRACTIONATED): Heparin Unfractionated: 0.64 IU/mL (ref 0.30–0.70)

## 2016-05-24 MED ORDER — PREDNISONE 20 MG PO TABS
40.0000 mg | ORAL_TABLET | Freq: Every day | ORAL | Status: DC
  Administered 2016-05-24 – 2016-05-25 (×2): 40 mg via ORAL
  Filled 2016-05-24 (×2): qty 2

## 2016-05-24 MED ORDER — POTASSIUM CHLORIDE CRYS ER 20 MEQ PO TBCR
40.0000 meq | EXTENDED_RELEASE_TABLET | Freq: Once | ORAL | Status: AC
  Administered 2016-05-24: 40 meq via ORAL
  Filled 2016-05-24: qty 2

## 2016-05-24 MED ORDER — ENOXAPARIN SODIUM 40 MG/0.4ML ~~LOC~~ SOLN
40.0000 mg | SUBCUTANEOUS | Status: DC
  Administered 2016-05-24 – 2016-05-25 (×2): 40 mg via SUBCUTANEOUS
  Filled 2016-05-24 (×2): qty 0.4

## 2016-05-24 MED ORDER — DIPHENHYDRAMINE HCL 25 MG PO CAPS
25.0000 mg | ORAL_CAPSULE | Freq: Every evening | ORAL | Status: DC | PRN
  Administered 2016-05-24: 50 mg via ORAL
  Filled 2016-05-24: qty 2

## 2016-05-24 MED ORDER — FUROSEMIDE 10 MG/ML IJ SOLN
40.0000 mg | Freq: Once | INTRAMUSCULAR | Status: AC
Start: 2016-05-24 — End: 2016-05-24
  Administered 2016-05-24: 40 mg via INTRAVENOUS
  Filled 2016-05-24: qty 4

## 2016-05-24 MED ORDER — PERFLUTREN LIPID MICROSPHERE
1.0000 mL | INTRAVENOUS | Status: AC | PRN
  Administered 2016-05-24: 3 mL via INTRAVENOUS
  Filled 2016-05-24: qty 10

## 2016-05-24 NOTE — Clinical Social Work Note (Signed)
Sitter dc'ed at 7:09 am this morning. Patient will be stable for discharge to SNF tomorrow. CSW confirmed with MD, SNF, and patient's mother. CSW faxed clinicals to [facility] for authorization review.  [name], [place]

## 2016-05-24 NOTE — Progress Notes (Signed)
  Echocardiogram 2D Echocardiogram with definity has been performed.  Darlina Sicilian M 05/24/2016, 10:08 AM

## 2016-05-24 NOTE — Plan of Care (Signed)
Problem: Health Behavior/Discharge Planning: Goal: Ability to manage health-related needs will improve Outcome: Progressing SW following, pt likely to be discharged to SNF.   Problem: Activity: Goal: Risk for activity intolerance will decrease Outcome: Progressing PT/OT consulted. Patient has been getting up to [facility] with assistance for toileting needs.

## 2016-05-24 NOTE — Progress Notes (Signed)
[facility]                                                                              Patient Demographics  [name], is a 51 y.o. female, DOB - 1965-01-20, DUK:[id number]  Admit date - 05/19/2016   Admitting Physician Ivor Costa, MD  Outpatient Primary MD for the patient is Hilbert Bible  Outpatient specialists:   LOS - 5  days    Chief Complaint  Patient presents with  . Weakness  . Shortness of Breath       Brief summary   51 year old female with medical history significant for hypertension, HIV, diabetes, asthma/COPD, GERD, depression, tobacco abuse, chronic kidney disease stage 3-4 and left BKA; who presented to the hospital secondary to acute respiratory failure with hypoxia and worsening wheezing. Patient with generalized weakness and altered mental status; found by EMS with oxygen saturation around 65-70% only oriented to person. CXR without infiltrates; patient was afebrile and with normal WBCs. Of note she was about 5 weeks ago admitted to [facility] secondary to community acquired pneumonia and influenza.   Assessment & Plan    Principal Problem:   Acute on chronic respiratory failure with hypoxia ([place]), possibly due to a asthma exacerbation, bronchitis, has morbid obesity with BMI of 35.9, possibly obesity hypoventilation/OSA -Currently improving, O2 sat 97% on 2 L, continue Pulmicort, scheduled DuoNeb nebs, antibiotics - Continue flutter valve - will benefit from sleep study outpatient to rule out underlying OSA -  Taper prednisone to 40 mg daily - I's and O's with positive balance of 10.6 L, on Lasix 80 mg at home, unclear if she's taking it. BNP 119.  - DC IV fluids, will give 1 dose of Lasix 40 mg IV 1, may restart 40 mg Lasix oral daily from 5/25, will also follow cardiology recommendations - CT angiogram of the chest negative for pulmonary embolism, DC heparin drip  Active problems  HIV-AIDS - CD4 count 620 -  Continue Genvoya, outpatient follow-up with ID  Elevated troponins, plateau at 1.21 - Risk factors of diabetes mellitus, hyperlipidemia, hypertension, tobacco abuse, morbid obesity -  cardiology consulted, recommended no ischemic workup at this time, likely due to #1 -CT angiogram of the chest negative for PE, heparin drip discontinued - Appears that 2-D echo was never done, per RN, patient was not cooperative on 5/22, will reorder 2-D echo today  Diabetes mellitus type 2, uncontrolled, with hypoglycemia this morning - A1c 9.8 - Continue sliding scale insulin, Lantus and NovoLog for meal coverage - Continue to hold oral hypoglycemics while inpatient - CBGs expected to improve with tapering of prednisone today  Acute on chronic CKD stage III - Currently stable, continue to hold nephrotoxic agents - Creatinine stable  Morbid obesity, BMI 34.8 - Patient counseled on diet and weight control  Nicotine abuse - Continue NicoDerm patch  Hyperlipidemia Continue statins  Acute metabolic encephalopathy - TSH normal, RPR negative, B-12 normal - Resolved, patient back to her baseline mental status  Hypokalemia   - replaced   Code Status:  Full code  DVT prophylaxis:   heparin drip discontinued, will start  on prophylactic dose of Lovenox Family Communication: No family member at the bedside however patient alert and oriented 4. Discussed plan with the patient  Disposition Plan: Will need skilled nursing facility  Time Spent in minutes   35 minutes  Procedures:  Right lower extremity duplex : Negative for any SVT DVT or Baker's cyst   Consultants: Cardiology  Antimicrobials :    doxycycline 5/20 >   Medications  Scheduled Meds: . aspirin EC  81 mg Oral Daily  . budesonide (PULMICORT) nebulizer solution  0.5 mg Nebulization BID  . doxycycline  100 mg Oral Q12H  . elvitegravir-cobicistat-emtricitabine-tenofovir  1 tablet Oral Q breakfast  . furosemide  40 mg  Intravenous Once  . insulin aspart  0-20 Units Subcutaneous TID WC  . insulin aspart  0-5 Units Subcutaneous QHS  . insulin aspart  5 Units Subcutaneous TID WC  . insulin glargine  50 Units Subcutaneous BID  . ipratropium  0.5 mg Nebulization TID  . levalbuterol  1.25 mg Nebulization TID  . nicotine  21 mg Transdermal Daily  . nystatin  5 mL Oral QID  . pantoprazole  40 mg Oral Q1200  . predniSONE  40 mg Oral Q breakfast  . rosuvastatin  20 mg Oral q1800   Continuous Infusions:  PRN Meds:.acetaminophen, haloperidol lactate, hydrALAZINE, LORazepam, ondansetron   Antibiotics   Anti-infectives    Start     Dose/Rate Route Frequency Ordered Stop   05/22/16 1200  doxycycline (VIBRA-TABS) tablet 100 mg     100 mg Oral Every 12 hours 05/22/16 1149     05/22/16 0800  elvitegravir-cobicistat-emtricitabine-tenofovir (GENVOYA) 150-150-200-10 MG tablet 1 tablet     1 tablet Oral Daily with breakfast 05/21/16 1026     05/20/16 0030  doxycycline (VIBRAMYCIN) 100 mg in dextrose 5 % 250 mL IVPB  Status:  Discontinued     100 mg 125 mL/hr over 120 Minutes Intravenous Every 12 hours 05/20/16 0019 05/22/16 1149   05/19/16 2330  vancomycin (VANCOCIN) IVPB 1000 mg/200 mL premix     1,000 mg 200 mL/hr over 60 Minutes Intravenous  Once 05/19/16 2316 05/20/16 0114   05/19/16 2330  piperacillin-tazobactam (ZOSYN) IVPB 3.375 g     3.375 g 100 mL/hr over 30 Minutes Intravenous  Once 05/19/16 2316 05/20/16 0016        Subjective:   [name] was seen and examined today. Alert and oriented 3, wants to go home soon. No fevers or chills. No chest pain. Shortness of breath is improving. Denies any dizziness, nausea, vomiting, abdominal pain, any new focal weakness or numbness or tingling. No acute issues overnight.  Objective:   Vitals:   05/24/16 0348 05/24/16 0722 05/24/16 0741 05/24/16 0742  BP:  (!) 111/95    Pulse:  (!) 42    Resp:  (!) 22    Temp:  97.6 F (36.4 C)    TempSrc:  Oral     SpO2:  94% 97% 97%  Weight: 109.2 kg (240 lb 11.9 oz)     Height:        Intake/Output Summary (Last 24 hours) at 05/24/16 0900 Last data filed at 05/24/16 0900  Gross per 24 hour  Intake          2794.73 ml  Output              795 ml  Net          1999.73 ml     Wt Readings from Last 3  Encounters:  05/24/16 109.2 kg (240 lb 11.9 oz)  04/06/16 112.5 kg (248 lb 0.3 oz)  03/05/16 113.4 kg (250 lb)     Exam  General: Alert and oriented 3, NAD  HEENT:  PERRLA, EOMI, Anicteric Sclera, mucous membranes moist.   Neck: Supple,  no JVD  Cardiovascular: S1 S2 clear, RRR  Respiratory: Decreased breath sounds at the bases otherwise fairly clear  Gastrointestinal: Obese, Soft, NT, ND, NBS  Ext: no cyanosis clubbing, left BKA  Neuro: no new focal neurological deficits  Skin: No rashes  Psych: alert and oriented 3, normal affect   Data Reviewed:  I have personally reviewed following labs and imaging studies  Micro Results Recent Results (from the past 240 hour(s))  Culture, blood (Routine X 2) w Reflex to ID Panel     Status: None (Preliminary result)   Collection Time: 05/19/16 10:17 PM  Result Value Ref Range Status   Specimen Description BLOOD RIGHT ANTECUBITAL  Final   Special Requests   Final    BOTTLES DRAWN AEROBIC AND ANAEROBIC Blood Culture adequate volume   Culture NO GROWTH 4 DAYS  Final   Report Status PENDING  Incomplete  Culture, blood (Routine X 2) w Reflex to ID Panel     Status: None (Preliminary result)   Collection Time: 05/19/16 10:17 PM  Result Value Ref Range Status   Specimen Description BLOOD LEFT ANTECUBITAL  Final   Special Requests   Final    BOTTLES DRAWN AEROBIC AND ANAEROBIC Blood Culture adequate volume   Culture NO GROWTH 4 DAYS  Final   Report Status PENDING  Incomplete  MRSA PCR Screening     Status: None   Collection Time: 05/20/16  3:56 PM  Result Value Ref Range Status   MRSA by PCR NEGATIVE NEGATIVE Final    Comment:         The GeneXpert MRSA Assay (FDA approved for NASAL specimens only), is one component of a comprehensive MRSA colonization surveillance program. It is not intended to diagnose MRSA infection nor to guide or monitor treatment for MRSA infections.     Radiology Reports Ct Head Wo Contrast  Result Date: 05/20/2016 CLINICAL DATA:  Acute encephalopathy, weakness for 2 weeks EXAM: CT HEAD WITHOUT CONTRAST TECHNIQUE: Contiguous axial images were obtained from the base of the skull through the vertex without intravenous contrast. COMPARISON:  None. FINDINGS: Brain: Mild superficial and central atrophy with minimal small vessel ischemic disease of periventricular white matter. No intra-axial mass, hemorrhage or midline. No extra-axial fluid collections. Vascular: Mild atherosclerosis of the carotid siphons no hyperdense vessels. Skull: Negative for fracture or focal lesion. Sinuses/Orbits: The visualized mastoids, paranasal sinuses, orbits and globes are unremarkable. Other: None. IMPRESSION: Mild atrophy with minimal small vessel ischemic disease of periventricular white matter. No acute intracranial appearing abnormality. Electronically Signed   By: Ashley Royalty M.D.   On: 05/20/2016 01:50   Ct Angio Chest Pe W Or Wo Contrast  Result Date: 05/23/2016 CLINICAL DATA:  Altered mental status.  Low O2 saturation. EXAM: CT ANGIOGRAPHY CHEST WITH CONTRAST TECHNIQUE: Multidetector CT imaging of the chest was performed using the standard protocol during bolus administration of intravenous contrast. Multiplanar CT image reconstructions and MIPs were obtained to evaluate the vascular anatomy. CONTRAST:  100 cc Isovue 370 COMPARISON:  Chest x-ray dated 05/20/2016 FINDINGS: Cardiovascular: There is borderline cardiomegaly. No pulmonary emboli. No pericardial effusion. Minimal calcification in the thoracic aorta. Mediastinum/Nodes: No enlarged mediastinal, hilar, or axillary lymph nodes. Thyroid gland,  trachea, and  esophagus demonstrate no significant findings. Lungs/Pleura: There is prominent peribronchial thickening particularly in the lower lobes with marked narrowing of some of the bronchi in the lower lobes with linear atelectasis at both lung bases. There is also peribronchial thickening in the upper lobes. No discrete consolidative infiltrates or effusions. Upper Abdomen: No acute abnormality. Musculoskeletal: No chest wall abnormality. No acute or significant osseous findings. Review of the MIP images confirms the above findings. IMPRESSION: 1. No pulmonary emboli. 2. Very prominent peribronchial thickening with occlusion of some of the lower lobe bronchi with atelectasis at the lung bases. Electronically Signed   By: Lorriane Shire M.D.   On: 05/23/2016 16:00   Dg Chest Port 1 View  Result Date: 05/20/2016 CLINICAL DATA:  Evaluate central line placement EXAM: PORTABLE CHEST 1 VIEW COMPARISON:  May 19, 2016 FINDINGS: A new right central line terminates near the caval atrial junction. No pneumothorax. Mild bibasilar atelectasis. No other acute abnormality or change. IMPRESSION: The new right central line is in good position.  No pneumothorax. Electronically Signed   By: Dorise Bullion III M.D   On: 05/20/2016 18:32   Dg Chest Port 1 View  Result Date: 05/19/2016 CLINICAL DATA:  Weakness EXAM: PORTABLE CHEST 1 VIEW COMPARISON:  04/25/2016 FINDINGS: The heart size and mediastinal contours are within normal limits. Both lungs are clear. Chronic bilateral healed sixth and seventh rib fractures. IMPRESSION: No active disease. Chronic bilateral sixth and seventh rib fractures. Electronically Signed   By: Ashley Royalty M.D.   On: 05/19/2016 23:06    Lab Data:  CBC:  Recent Labs Lab 05/19/16 2217 05/19/16 2243 05/21/16 0603 05/22/16 0425 05/23/16 0541 05/24/16 0440  WBC 9.6  --  8.6 8.1 8.2 5.2  NEUTROABS 8.0*  --   --   --   --   --   HGB 13.7 16.3* 10.5* 10.7* 10.0* 10.2*  HCT 45.8 48.0* 36.5 36.1  34.1* 34.7*  MCV 91.6  --  89.7 87.6 88.1 86.3  PLT 340  --  317 313 282 885   Basic Metabolic Panel:  Recent Labs Lab 05/19/16 2217 05/19/16 2243 05/21/16 0603 05/22/16 0425 05/23/16 0614 05/23/16 1205 05/24/16 0440  NA 135 138 141 142 144  --  137  K 4.5 4.5 2.8* 3.1* 2.4* 3.8 3.1*  CL 98* 98* 106 106 105  --  100*  CO2 23  --  26 28 31   --  29  GLUCOSE 567* 563* 132* 143* 40*  --  277*  BUN 19 26* 9 7 7   --  8  CREATININE 2.23* 1.90* 1.07* 0.91 0.97  --  0.97  CALCIUM 9.0  --  8.4* 8.8* 8.5*  --  8.5*  MG  --   --   --   --  1.4*  --   --    GFR: Estimated Creatinine Clearance: 90.8 mL/min (by C-G formula based on SCr of 0.97 mg/dL). Liver Function Tests:  Recent Labs Lab 05/19/16 2217  AST 28  ALT 17  ALKPHOS 80  BILITOT 0.5  PROT 7.5  ALBUMIN 3.4*   No results for input(s): LIPASE, AMYLASE in the last 168 hours. No results for input(s): AMMONIA in the last 168 hours. Coagulation Profile:  Recent Labs Lab 05/20/16 0049  INR 1.06   Cardiac Enzymes:  Recent Labs Lab 05/20/16 0049 05/20/16 0647 05/20/16 1206  TROPONINI 0.83* 1.21* 0.85*   BNP (last 3 results) No results for input(s): PROBNP in the last  8760 hours. HbA1C: No results for input(s): HGBA1C in the last 72 hours. CBG:  Recent Labs Lab 05/23/16 0741 05/23/16 1122 05/23/16 1728 05/23/16 2148 05/24/16 0846  GLUCAP 128* 148* 181* 294* 191*   Lipid Profile: No results for input(s): CHOL, HDL, LDLCALC, TRIG, CHOLHDL, LDLDIRECT in the last 72 hours. Thyroid Function Tests:  Recent Labs  05/22/16 1600  TSH 1.571   Anemia Panel:  Recent Labs  05/22/16 1600  VITAMINB12 784   Urine analysis:    Component Value Date/Time   COLORURINE YELLOW 05/19/2016 2232   APPEARANCEUR CLEAR 05/19/2016 2232   LABSPEC 1.022 05/19/2016 2232   PHURINE 5.0 05/19/2016 2232   GLUCOSEU >=500 (A) 05/19/2016 2232   GLUCOSEU 250 (A) 03/16/2015 1612   HGBUR SMALL (A) 05/19/2016 2232    BILIRUBINUR NEGATIVE 05/19/2016 2232   KETONESUR 5 (A) 05/19/2016 2232   PROTEINUR 30 (A) 05/19/2016 2232   UROBILINOGEN 0.2 03/16/2015 1612   NITRITE NEGATIVE 05/19/2016 2232   LEUKOCYTESUR NEGATIVE 05/19/2016 2232     [name] M.D. [facility] 05/24/2016, 9:00 AM  Pager: [phone] Between 7am to 7pm - call Pager - [phone]  After 7pm go to [email or web address] - password [email or web address]  Call night coverage person covering after 7pm

## 2016-05-24 NOTE — Progress Notes (Signed)
Progress Note  Patient Name: [name] Date of Encounter: 05/24/2016  Primary Cardiologist: None  Subjective   Patient seen and examined this morning.  She reports feeling tired but no other complaints.  She is without CP.  2-D echo currently being performed.  Inpatient Medications    Scheduled Meds: . aspirin EC  81 mg Oral Daily  . budesonide (PULMICORT) nebulizer solution  0.5 mg Nebulization BID  . doxycycline  100 mg Oral Q12H  . elvitegravir-cobicistat-emtricitabine-tenofovir  1 tablet Oral Q breakfast  . enoxaparin (LOVENOX) injection  40 mg Subcutaneous Q24H  . furosemide  40 mg Intravenous Once  . insulin aspart  0-20 Units Subcutaneous TID WC  . insulin aspart  0-5 Units Subcutaneous QHS  . insulin aspart  5 Units Subcutaneous TID WC  . insulin glargine  50 Units Subcutaneous BID  . ipratropium  0.5 mg Nebulization TID  . levalbuterol  1.25 mg Nebulization TID  . nicotine  21 mg Transdermal Daily  . nystatin  5 mL Oral QID  . pantoprazole  40 mg Oral Q1200  . predniSONE  40 mg Oral Q breakfast  . rosuvastatin  20 mg Oral q1800   Continuous Infusions:  PRN Meds: acetaminophen, haloperidol lactate, hydrALAZINE, LORazepam, ondansetron   Vital Signs    Vitals:   05/24/16 0348 05/24/16 0722 05/24/16 0741 05/24/16 0742  BP:  (!) 111/95    Pulse:  (!) 42    Resp:  (!) 22    Temp:  97.6 F (36.4 C)    TempSrc:  Oral    SpO2:  94% 97% 97%  Weight: 240 lb 11.9 oz (109.2 kg)     Height:        Intake/Output Summary (Last 24 hours) at 05/24/16 0934 Last data filed at 05/24/16 0900  Gross per 24 hour  Intake          2794.73 ml  Output              795 ml  Net          1999.73 ml   Filed Weights   05/22/16 0426 05/23/16 0441 05/24/16 0348  Weight: 242 lb 15.2 oz (110.2 kg) 245 lb 9.5 oz (111.4 kg) 240 lb 11.9 oz (109.2 kg)    ECG    No new tracings   Physical Exam   GEN: No acute distress, lying on left side, getting 2-D echo.   Neck:  Right IJ, JVD difficult to assess due to habitus Cardiac: RRR, soft murmur at LUSB Respiratory: on nasal cannula, normal effort. GI: Obese abdomen, nontender, non-distended  MS: No edema; left BKA. Neuro:  Nonfocal  Psych: Normal affect   Labs    Chemistry Recent Labs Lab 05/19/16 2217  05/22/16 0425 05/23/16 0614 05/23/16 1205 05/24/16 0440  NA 135  < > 142 144  --  137  K 4.5  < > 3.1* 2.4* 3.8 3.1*  CL 98*  < > 106 105  --  100*  CO2 23  < > 28 31  --  29  GLUCOSE 567*  < > 143* 40*  --  277*  BUN 19  < > 7 7  --  8  CREATININE 2.23*  < > 0.91 0.97  --  0.97  CALCIUM 9.0  < > 8.8* 8.5*  --  8.5*  PROT 7.5  --   --   --   --   --   ALBUMIN 3.4*  --   --   --   --   --  AST 28  --   --   --   --   --   ALT 17  --   --   --   --   --   ALKPHOS 80  --   --   --   --   --   BILITOT 0.5  --   --   --   --   --   GFRNONAA 24*  < > >60 >60  --  >60  GFRAA 28*  < > >60 >60  --  >60  ANIONGAP 14  < > 8 8  --  8  < > = values in this interval not displayed.   Hematology Recent Labs Lab 05/22/16 0425 05/23/16 0541 05/24/16 0440  WBC 8.1 8.2 5.2  RBC 4.12 3.87 4.02  HGB 10.7* 10.0* 10.2*  HCT 36.1 34.1* 34.7*  MCV 87.6 88.1 86.3  MCH 26.0 25.8* 25.4*  MCHC 29.6* 29.3* 29.4*  RDW 17.2* 17.4* 17.2*  PLT 313 282 256    Cardiac Enzymes Recent Labs Lab 05/20/16 0049 05/20/16 0647 05/20/16 1206  TROPONINI 0.83* 1.21* 0.85*    Recent Labs Lab 05/19/16 2241  TROPIPOC 0.55*     BNP Recent Labs Lab 05/19/16 2217 05/23/16 1205  BNP 520.0* 119.1*     DDimer  Recent Labs Lab 05/20/16 0413  DDIMER 0.58*     Radiology    Ct Angio Chest Pe W Or Wo Contrast  Result Date: 05/23/2016 CLINICAL DATA:  Altered mental status.  Low O2 saturation. EXAM: CT ANGIOGRAPHY CHEST WITH CONTRAST TECHNIQUE: Multidetector CT imaging of the chest was performed using the standard protocol during bolus administration of intravenous contrast. Multiplanar CT image  reconstructions and MIPs were obtained to evaluate the vascular anatomy. CONTRAST:  100 cc Isovue 370 COMPARISON:  Chest x-ray dated 05/20/2016 FINDINGS: Cardiovascular: There is borderline cardiomegaly. No pulmonary emboli. No pericardial effusion. Minimal calcification in the thoracic aorta. Mediastinum/Nodes: No enlarged mediastinal, hilar, or axillary lymph nodes. Thyroid gland, trachea, and esophagus demonstrate no significant findings. Lungs/Pleura: There is prominent peribronchial thickening particularly in the lower lobes with marked narrowing of some of the bronchi in the lower lobes with linear atelectasis at both lung bases. There is also peribronchial thickening in the upper lobes. No discrete consolidative infiltrates or effusions. Upper Abdomen: No acute abnormality. Musculoskeletal: No chest wall abnormality. No acute or significant osseous findings. Review of the MIP images confirms the above findings. IMPRESSION: 1. No pulmonary emboli. 2. Very prominent peribronchial thickening with occlusion of some of the lower lobe bronchi with atelectasis at the lung bases. Electronically Signed   By: Lorriane Shire M.D.   On: 05/23/2016 16:00    Cardiac Studies   Echo This Admission: Pending  Vascular US Lower Extremity 05/21/2016: - No evidence of deep vein or superficial thrombosis involving the right lower extremity and left common femoral vein. - No evidence of Baker&'s cyst on the right.  Echo 2016: - Left ventricle: The cavity size was normal. There was mild focal basal hypertrophy of the septum. Systolic function was normal. The estimated ejection fraction was in the range of 60% to 65%. Wall motion was normal; there were no regional wall motion abnormalities. Doppler parameters are consistent with abnormal left ventricular relaxation (grade 1 diastolic dysfunction). - Mitral valve: Transvalvular velocity was within the normal range. There was no evidence for stenosis.  There was no regurgitation. - Right ventricle: The cavity size was mildly dilated. Wall thickness was normal. Systolic  function was normal. - Tricuspid valve: There was no regurgitation. - Inferior vena cava: The vessel was normal in size. The respirophasic diameter changes were in the normal range (= 50%), consistent with normal central venous pressure.  Patient Profile     [name] is a 51 y.o. female with a hx of left BKA (2016), PVD, HTN, HIV disease, DM, renal insufficiency, HLD, GERD who is being seen today for the evaluation of elevated troponins at the request of Dr. Tana Coast.  Assessment & Plan    1. Elevated Troponin: mild troponin elevation in the setting of hypoxia and renal insufficiency likely due to demand ischemia.  No chest pain currently.  She is currently getting her 2-D echo.  CTA chest yesterday was negative for PE and her heparin gtt was discontinued.  2.  Diastolic CHF: previous Echo noted preserved LV EF with only grade 1 diastolic dysfunction.  Her BNP was initially elevated but repeat measurement has come down.  She does not currently have physical or radiographic evidence of exacerbation.  2-D echo currently being performed.  3.  Acute Hypoxic and Hypercarbic Respiratory Failure: likely secondary to asthma exacerbation.  Management per primary service.  4. Hypokalemia: K+ 3.1 this morning.  Replacement per primary service.   Cardiology will sign off as a functional study or invasive procedure unlikely to be warranted at this time.  Please call back if there are questions or if we can be of further assistance.  SignedJule Ser, DO  05/24/2016, 9:34 AM     Agree with note by Dr Oswaldo Conroy, M.D., [facility]al[facility], [place] [address]. [place], [place]  [address]  [phone] 05/24/2016 10:24 AM

## 2016-05-25 DIAGNOSIS — G9341 Metabolic encephalopathy: Secondary | ICD-10-CM | POA: Diagnosis not present

## 2016-05-25 DIAGNOSIS — I214 Non-ST elevation (NSTEMI) myocardial infarction: Secondary | ICD-10-CM | POA: Diagnosis not present

## 2016-05-25 DIAGNOSIS — R531 Weakness: Secondary | ICD-10-CM | POA: Diagnosis not present

## 2016-05-25 DIAGNOSIS — J9691 Respiratory failure, unspecified with hypoxia: Secondary | ICD-10-CM | POA: Diagnosis not present

## 2016-05-25 DIAGNOSIS — I10 Essential (primary) hypertension: Secondary | ICD-10-CM | POA: Diagnosis not present

## 2016-05-25 DIAGNOSIS — E1165 Type 2 diabetes mellitus with hyperglycemia: Secondary | ICD-10-CM | POA: Diagnosis not present

## 2016-05-25 DIAGNOSIS — R2689 Other abnormalities of gait and mobility: Secondary | ICD-10-CM | POA: Diagnosis not present

## 2016-05-25 DIAGNOSIS — J9621 Acute and chronic respiratory failure with hypoxia: Secondary | ICD-10-CM | POA: Diagnosis not present

## 2016-05-25 DIAGNOSIS — E118 Type 2 diabetes mellitus with unspecified complications: Secondary | ICD-10-CM | POA: Diagnosis not present

## 2016-05-25 DIAGNOSIS — E785 Hyperlipidemia, unspecified: Secondary | ICD-10-CM | POA: Diagnosis not present

## 2016-05-25 DIAGNOSIS — J45901 Unspecified asthma with (acute) exacerbation: Secondary | ICD-10-CM | POA: Diagnosis not present

## 2016-05-25 DIAGNOSIS — Z21 Asymptomatic human immunodeficiency virus [HIV] infection status: Secondary | ICD-10-CM | POA: Diagnosis not present

## 2016-05-25 DIAGNOSIS — J45909 Unspecified asthma, uncomplicated: Secondary | ICD-10-CM | POA: Diagnosis not present

## 2016-05-25 DIAGNOSIS — N184 Chronic kidney disease, stage 4 (severe): Secondary | ICD-10-CM | POA: Diagnosis not present

## 2016-05-25 DIAGNOSIS — R278 Other lack of coordination: Secondary | ICD-10-CM | POA: Diagnosis not present

## 2016-05-25 DIAGNOSIS — I739 Peripheral vascular disease, unspecified: Secondary | ICD-10-CM | POA: Diagnosis not present

## 2016-05-25 DIAGNOSIS — J440 Chronic obstructive pulmonary disease with acute lower respiratory infection: Secondary | ICD-10-CM | POA: Diagnosis not present

## 2016-05-25 DIAGNOSIS — Z794 Long term (current) use of insulin: Secondary | ICD-10-CM | POA: Diagnosis not present

## 2016-05-25 DIAGNOSIS — B20 Human immunodeficiency virus [HIV] disease: Secondary | ICD-10-CM | POA: Diagnosis not present

## 2016-05-25 DIAGNOSIS — G9340 Encephalopathy, unspecified: Secondary | ICD-10-CM | POA: Diagnosis not present

## 2016-05-25 DIAGNOSIS — J9600 Acute respiratory failure, unspecified whether with hypoxia or hypercapnia: Secondary | ICD-10-CM | POA: Diagnosis not present

## 2016-05-25 LAB — BASIC METABOLIC PANEL
Anion gap: 10 (ref 5–15)
BUN: 11 mg/dL (ref 6–20)
CALCIUM: 9.2 mg/dL (ref 8.9–10.3)
CHLORIDE: 97 mmol/L — AB (ref 101–111)
CO2: 34 mmol/L — AB (ref 22–32)
CREATININE: 1.05 mg/dL — AB (ref 0.44–1.00)
GFR calc Af Amer: >60 mL/min (ref >60)
GFR calc non Af Amer: 60 mL/min — ABNORMAL LOW (ref >60)
GLUCOSE: 81 mg/dL (ref 65–99)
Potassium: 2.7 mmol/L — CL (ref 3.5–5.1)
Sodium: 141 mmol/L (ref 135–145)

## 2016-05-25 LAB — GLUCOSE, CAPILLARY
GLUCOSE-CAPILLARY: 63 mg/dL — AB (ref 65–99)
GLUCOSE-CAPILLARY: 228 mg/dL — AB (ref 65–99)
Glucose-Capillary: 150 mg/dL — ABNORMAL HIGH (ref 65–99)
Glucose-Capillary: 157 mg/dL — ABNORMAL HIGH (ref 65–99)
Glucose-Capillary: 264 mg/dL — ABNORMAL HIGH (ref 65–99)
Glucose-Capillary: 324 mg/dL — ABNORMAL HIGH (ref 65–99)

## 2016-05-25 LAB — POTASSIUM: Potassium: 3.1 mmol/L — ABNORMAL LOW (ref 3.5–5.1)

## 2016-05-25 LAB — MAGNESIUM
Magnesium: 1.3 mg/dL — ABNORMAL LOW (ref 1.7–2.4)
Magnesium: 2.3 mg/dL (ref 1.7–2.4)

## 2016-05-25 MED ORDER — INSULIN GLARGINE 300 UNIT/ML ~~LOC~~ SOPN: 50.0000 [IU] | PEN_INJECTOR | Freq: Two times a day (BID) | SUBCUTANEOUS | 1 refills | Status: DC

## 2016-05-25 MED ORDER — POTASSIUM CHLORIDE CRYS ER 20 MEQ PO TBCR: 40.0000 meq | EXTENDED_RELEASE_TABLET | Freq: Once | ORAL | Status: DC

## 2016-05-25 MED ORDER — INSULIN ASPART 100 UNIT/ML ~~LOC~~ SOLN: 0.0000 [IU] | Freq: Three times a day (TID) | SUBCUTANEOUS | 11 refills | Status: DC

## 2016-05-25 MED ORDER — MAGNESIUM OXIDE 400 MG PO TABS: 400.0000 mg | ORAL_TABLET | Freq: Every day | ORAL | Status: DC

## 2016-05-25 MED ORDER — IPRATROPIUM-ALBUTEROL 0.5-2.5 (3) MG/3ML IN SOLN: 3.0000 mL | Freq: Three times a day (TID) | RESPIRATORY_TRACT | Status: DC

## 2016-05-25 MED ORDER — DOXYCYCLINE HYCLATE 100 MG PO TABS: 100.0000 mg | ORAL_TABLET | Freq: Two times a day (BID) | ORAL | Status: DC

## 2016-05-25 MED ORDER — MAGNESIUM OXIDE 400 (241.3 MG) MG PO TABS
400.0000 mg | ORAL_TABLET | Freq: Two times a day (BID) | ORAL | Status: DC
  Administered 2016-05-25: 400 mg via ORAL

## 2016-05-25 MED ORDER — NICOTINE 21 MG/24HR TD PT24: 21.0000 mg | MEDICATED_PATCH | Freq: Every day | TRANSDERMAL | 0 refills | Status: DC

## 2016-05-25 MED ORDER — PREDNISONE 10 MG PO TABS: ORAL_TABLET | ORAL | Status: DC

## 2016-05-25 MED ORDER — NYSTATIN 100000 UNIT/ML MT SUSP
5.0000 mL | Freq: Four times a day (QID) | OROMUCOSAL | 0 refills | Status: DC
Start: 2016-05-25 — End: 2016-08-21

## 2016-05-25 MED ORDER — POTASSIUM CHLORIDE CRYS ER 20 MEQ PO TBCR: 20.0000 meq | EXTENDED_RELEASE_TABLET | Freq: Every day | ORAL | Status: DC

## 2016-05-25 MED ORDER — POTASSIUM PHOSPHATES 15 MMOLE/5ML IV SOLN
20.0000 meq | Freq: Once | INTRAVENOUS | Status: AC
  Administered 2016-05-25: 20 meq via INTRAVENOUS
  Filled 2016-05-25: qty 4.55

## 2016-05-25 MED ORDER — INSULIN ASPART 100 UNIT/ML ~~LOC~~ SOLN: 5.0000 [IU] | Freq: Three times a day (TID) | SUBCUTANEOUS | 11 refills | Status: DC

## 2016-05-25 MED ORDER — POTASSIUM PHOSPHATES 15 MMOLE/5ML IV SOLN: 30.0000 mmol | Freq: Once | INTRAVENOUS | Status: DC

## 2016-05-25 MED ORDER — MAGNESIUM SULFATE 50 % IJ SOLN
3.0000 g | Freq: Once | INTRAVENOUS | Status: AC
  Administered 2016-05-25: 3 g via INTRAVENOUS
  Filled 2016-05-25: qty 6

## 2016-05-25 MED ORDER — ASPIRIN 81 MG PO TBEC: 81.0000 mg | DELAYED_RELEASE_TABLET | Freq: Every day | ORAL | Status: DC

## 2016-05-25 MED ORDER — MAGNESIUM SULFATE IN D5W 1-5 GM/100ML-% IV SOLN
1.0000 g | Freq: Once | INTRAVENOUS | Status: AC
  Administered 2016-05-25: 1 g via INTRAVENOUS
  Filled 2016-05-25: qty 100

## 2016-05-25 NOTE — Progress Notes (Signed)
05/25/16  1740  Notified MD of patient's K+ 3.1  Per MD okay to discharge patient.

## 2016-05-25 NOTE — Progress Notes (Signed)
Clinical Social Worker facilitated patient discharge including contacting patient family and facility to confirm patient discharge plans.  Clinical information faxed to facility and family agreeable with plan.  CSW arranged ambulance transport via [place] to [place]  .  RN to call [phone] (pt will go to room 301 ph) report prior to discharge.  Clinical Social Worker will sign off for now as social work intervention is no longer needed. Please consult [place] again if new need arises.  Rhea Pink, MSW, [place]

## 2016-05-25 NOTE — Progress Notes (Signed)
Physical Therapy Treatment Patient Details Name: [name] MRN: [id number] DOB: Apr 01, 1965 Today's Date: 05/25/2016    History of Present Illness Patient is a 51 y/o female admitted with acute respiratory failure with hypoxia, worsening wheezing, generalized weakness and AMS; found by EMS with 02 around 65-70% only oriented to person. Pt found to have elevated troponin and elevated d-dimer level most likely demand ischemia Vs worsening renal function. PMH includes HIV, DM with retinopathy and peripheral neuropathy, PVD, Left BKA, HTN, depression.    PT Comments    Patient's cognition improving however she does continue to be a little impulsive and with decreased awareness of safety. Pt able to ambulate short distance with min A. Continue to progress as tolerated with anticipated d/c to SNF for further skilled PT services.     Follow Up Recommendations  SNF;Supervision/Assistance - 24 hour     Equipment Recommendations  None recommended by PT    Recommendations for Other Services       Precautions / Restrictions Precautions Precautions: Fall Precaution Comments: left BKA    Mobility  Bed Mobility Overal bed mobility: Modified Independent             General bed mobility comments: increased effort; use of rail and HOB elevated  Transfers Overall transfer level: Needs assistance Equipment used: Rolling walker (2 wheeled) Transfers: Sit to/from Stand Sit to Stand: Min assist         General transfer comment: cues for safe hand placement; assist to power up into standing; pt able to don prosthesis without assist this session  Ambulation/Gait Ambulation/Gait assistance: Min assist Ambulation Distance (Feet): 12 Feet Assistive device: Rolling walker (2 wheeled) Gait Pattern/deviations: Step-through pattern;Decreased stride length;Decreased dorsiflexion - right;Decreased dorsiflexion - left;Trunk flexed;Narrow base of support     General Gait Details: cues for  posture/forward gaze and wider BOS   Stairs            Wheelchair Mobility    Modified Rankin (Stroke Patients Only)       Balance Overall balance assessment: Needs assistance Sitting-balance support: Bilateral upper extremity supported;Feet supported Sitting balance-Leahy Scale: Fair     Standing balance support: Bilateral upper extremity supported;During functional activity Standing balance-Leahy Scale: Fair Standing balance comment: reliant on RW                            Cognition Arousal/Alertness: Awake/alert Behavior During Therapy: Flat affect Overall Cognitive Status: Impaired/Different from baseline Area of Impairment: Safety/judgement;Awareness                         Safety/Judgement: Decreased awareness of safety;Decreased awareness of deficits Awareness: Emergent          Exercises      General Comments        Pertinent Vitals/Pain Pain Assessment: No/denies pain    Home Living                      Prior Function            PT Goals (current goals can now be found in the care plan section) Progress towards PT goals: Progressing toward goals    Frequency    Min 3X/week      PT Plan Current plan remains appropriate    Co-evaluation              AM-PAC PT "6 Clicks" Daily Activity  Outcome Measure  Difficulty turning over in bed (including adjusting bedclothes, sheets and blankets)?: A Little Difficulty moving from lying on back to sitting on the side of the bed? : A Little Difficulty sitting down on and standing up from a chair with arms (e.g., wheelchair, bedside commode, etc,.)?: Total Help needed moving to and from a bed to chair (including a wheelchair)?: A Little Help needed walking in hospital room?: A Little Help needed climbing 3-5 steps with a railing? : A Lot 6 Click Score: 15    End of Session Equipment Utilized During Treatment: Gait belt;Oxygen Activity Tolerance: Patient  tolerated treatment well Patient left: in chair;with call bell/phone within reach;with chair alarm set Nurse Communication: Mobility status PT Visit Diagnosis: Unsteadiness on feet (R26.81);Muscle weakness (generalized) (M62.81);Other (comment)     Time: 4193-7902 PT Time Calculation (min) (ACUTE ONLY): 23 min  Charges:  $Gait Training: 8-22 mins $Therapeutic Activity: 8-22 mins                    G Codes:       Earney Navy, PTA Pager: [phone]     Darliss Cheney 05/25/2016, 1:09 PM

## 2016-05-25 NOTE — Progress Notes (Signed)
05/25/16  1742  Called to give report [phone]. Report given to [facility].

## 2016-05-25 NOTE — Progress Notes (Signed)
05/25/16  1830  Reviewed discharge instructions with patient. Patient verbalized understanding of discharge instructions. Copy of discharge instructions given to patient.

## 2016-05-25 NOTE — Discharge Summary (Addendum)
Physician Discharge Summary   Patient ID: [name] MRN: [id number] DOB/AGE: 07/07/1965 51 y.o.  Admit date: 05/19/2016 Discharge date: 05/25/2016  Primary Care Physician:  Michel Harrow, PA-C  Discharge Diagnoses:   . Acute on chronic respiratory failure with hypoxia ([place]) . NSTEMI (non-ST elevated myocardial infarction) ([place]) . Asthma exacerbation attacks . Acute metabolic encephalopathy . Human immunodeficiency virus (HIV) disease ([place]) . Diabetes mellitus type 2 with complications, uncontrolled ([place]) . Dyslipidemia . Essential hypertension . Chronic kidney disease (CKD), stage IV (severe) ([place]) . Hyperlipidemia . Diastolic dysfunction    Consults: Cardiology Critical care  Recommendations for Outpatient Follow-up:  1. Please repeat CBC/BMET at next visit, please check K and Mg in 3 days   DIET: Heart healthy, carb modified    Allergies:   Allergies  Allergen Reactions  . Cortizone-10 [Hydrocortisone] Rash    Per patient, given local injection at knee and developed rash at local site.      DISCHARGE MEDICATIONS: Current Discharge Medication List    START taking these medications   Details  doxycycline (VIBRA-TABS) 100 MG tablet Take 1 tablet (100 mg total) by mouth 2 (two) times daily. X 5 days    magnesium oxide (MAG-OX) 400 MG tablet Take 1 tablet (400 mg total) by mouth daily.    nicotine (NICODERM CQ - DOSED IN MG/24 HOURS) 21 mg/24hr patch Place 1 patch (21 mg total) onto the skin daily. Qty: 28 patch, Refills: 0    nystatin (MYCOSTATIN) 100000 UNIT/ML suspension Take 5 mLs (500,000 Units total) by mouth 4 (four) times daily. Qty: 60 mL, Refills: 0    predniSONE (DELTASONE) 10 MG tablet Prednisone dosing: Take  Prednisone 30mg  (3 tabs) x 3 days, then 20mg  (2 tabs) x 3days, then 10mg  (1 tab) x 3days, then OFF.      CONTINUE these medications which have CHANGED   Details  aspirin EC 81 MG EC tablet Take 1 tablet (81 mg total) by mouth  daily.    !! insulin aspart (NOVOLOG) 100 UNIT/ML injection Inject 5 Units into the skin 3 (three) times daily with meals. Qty: 10 mL, Refills: 11    !! insulin aspart (NOVOLOG) 100 UNIT/ML injection Inject 0-15 Units into the skin 3 (three) times daily with meals. Sliding scale  CBG 70 - 120: 0 units: CBG 121 - 150: 2 units; CBG 151 - 200: 3 units; CBG 201 - 250: 5 units; CBG 251 - 300: 8 units;CBG 301 - 350: 11 units; CBG 351 - 400: 15 units; CBG > 400 : 15 units and notify MD Qty: 10 mL, Refills: 11    Insulin Glargine (TOUJEO SOLOSTAR) 300 UNIT/ML SOPN Inject 50 Units into the skin 2 (two) times daily. Qty: 9 pen, Refills: 1    ipratropium-albuterol (DUONEB) 0.5-2.5 (3) MG/3ML SOLN Take 3 mLs by nebulization 3 (three) times daily. Qty: 360 mL    potassium chloride SA (K-DUR,KLOR-CON) 20 MEQ tablet Take 1 tablet (20 mEq total) by mouth daily. X 3 days, then check potassium levels if needed to continue     !! - Potential duplicate medications found. Please discuss with provider.    CONTINUE these medications which have NOT CHANGED   Details  budesonide (PULMICORT) 0.5 MG/2ML nebulizer solution Take 0.5 mg by nebulization 2 (two) times daily.    diphenhydramine-acetaminophen (TYLENOL PM) 25-500 MG TABS tablet Take 2 tablets by mouth at bedtime.    diphenoxylate-atropine (LOMOTIL) 2.5-0.025 MG tablet Take 1 tablet by mouth 4 (four) times daily  as needed for diarrhea or loose stools. Qty: 60 tablet, Refills: 3   Associated Diagnoses: Human immunodeficiency virus (HIV) disease ([name])    Eszopiclone 3 MG TABS Take 3 mg by mouth at bedtime as needed for sleep. Take immediately before bedtime    GENVOYA 150-150-200-10 MG TABS tablet TAKE 1 TABLET BY MOUTH EVERY DAY Qty: 30 tablet, Refills: 5    pantoprazole (PROTONIX) 40 MG tablet Take 40 mg by mouth daily. Refills: 0    rosuvastatin (CRESTOR) 20 MG tablet Take 20 mg by mouth daily. Reported on 05/02/2015 Refills: 0    Cholecalciferol  (VITAMIN D3) 5000 UNITS TABS Take 5,000 Units by mouth daily.     glucose blood (ONETOUCH VERIO) test strip Use to test blood sugar 3 times daily Qty: 100 each, Refills: 4    vitamin E (VITAMIN E) 1000 UNIT capsule Take 1,000 Units by mouth daily.       STOP taking these medications     atenolol (TENORMIN) 25 MG tablet      budesonide-formoterol (SYMBICORT) 80-4.5 MCG/ACT inhaler      clonazePAM (KLONOPIN) 0.5 MG tablet      fenofibrate (TRICOR) 145 MG tablet      furosemide (LASIX) 80 MG tablet      HYDROcodone-acetaminophen (NORCO) 10-325 MG tablet      insulin regular human CONCENTRATED (HUMULIN R U-500 KWIKPEN) 500 UNIT/ML kwikpen      liraglutide (VICTOZA) 18 MG/3ML SOPN      metFORMIN (GLUCOPHAGE-XR) 750 MG 24 hr tablet      pregabalin (LYRICA) 225 MG capsule      cyclobenzaprine (FLEXERIL) 5 MG tablet      dextromethorphan (DELSYM) 30 MG/5ML liquid      Insulin Pen Needle (NOVOTWIST) 32G X 5 MM MISC          Brief H and P: For complete details please refer to admission H and P, but in brief 51 year old female with medical history significant for hypertension, HIV, diabetes, asthma/COPD, GERD, depression, tobacco abuse, chronic kidney disease stage 3-4 and left BKA; who presented to the hospital secondary to acute respiratory failure with hypoxia and worsening wheezing. Patient with generalized weakness and altered mental status; found by EMS with oxygen saturation around 65-70% only oriented to person. CXR without infiltrates; patient was afebrile and with normal WBCs. Of note she was about 5 weeks ago admitted to [facility] secondary to community acquired pneumonia and influenza.  Hospital Course:    Acute on chronic respiratory failure with hypoxia ([place]), possibly due to a asthma exacerbation, bronchitis, has morbid obesity with BMI of 35.9, possibly obesity hypoventilation/OSA -Currently improving, O2 sat 97% on 2 L, continue Pulmicort, scheduled  DuoNeb nebs, antibiotics - Continue flutter valve - will benefit from sleep study outpatient to rule out underlying OSA - IV fluids were discontinued, patient was given 1 dose of Lasix. Cardiology however did not feel that patient was in CHF exacerbation and does not need any further Lasix. - CT angiogram of the chest negative for pulmonary embolism, DC heparin drip - Prednisone tapered, continue doxycycline, nicotine patch, pulmicort   HIV-AIDS - CD4 count 620 - Continue Genvoya, outpatient follow-up with ID  Elevated troponins, plateau at 1.21 - Risk factors of diabetes mellitus, hyperlipidemia, hypertension, tobacco abuse, morbid obesity - cardiology was consulted, recommended no ischemic workup at this time, likely due to #1 - CT angiogram of the chest negative for PE, heparin drip discontinued - 2-D echo showed EF of 60- 65% with  grade 2 diastolic dysfunction  Diabetes mellitus type 2, uncontrolled, with hypoglycemia this morning - A1c 9.8 - Continue sliding scale insulin, Lantus and NovoLog for meal coverage - CBGs expected to improve with tapering of prednisone   Acute on chronic CKD stage III - Currently stable, continue to hold nephrotoxic agents - Creatinine stable  Morbid obesity, BMI 34.8 - Patient counseled on diet and weight control  Nicotine abuse - Continue NicoDerm patch  Hyperlipidemia Continue statins  Acute metabolic encephalopathy - TSH normal, RPR negative, B-12 normal - Resolved, patient back to her baseline mental status  Hypokalemia, hypomagnesemia   - replaced aggresively, give oral 40 MEQ and IV 1meq IV potassium today. Given IV magnesium and placed on oral magnesium - repeat potassium 3.1 at discharge.     Day of Discharge BP 135/86 (BP Location: Left Arm)   Pulse 100   Temp 97.9 F (36.6 C) (Oral)   Resp (!) 22   Ht 5\' 10"  (1.778 m) Comment: from March 2018 records  Wt 106.4 kg (234 lb 9.1 oz)   LMP 11/16/2010   SpO2 96%    BMI 33.66 kg/m   Physical Exam: General: Alert and awake oriented x3 not in any acute distress. HEENT: anicteric sclera, pupils reactive to light and accommodation CVS: S1-S2 clear no murmur rubs or gallops Chest: clear to auscultation bilaterally, no wheezing rales or rhonchi Abdomen: soft nontender, nondistended, normal bowel sounds Extremities: no cyanosis, clubbing or edema noted bilaterally Neuro: Cranial nerves II-XII intact, no focal neurological deficits   The results of significant diagnostics from this hospitalization (including imaging, microbiology, ancillary and laboratory) are listed below for reference.    LAB RESULTS: Basic Metabolic Panel:  Recent Labs Lab 05/24/16 0440 05/25/16 0500  05/25/16 1600  NA 137 141  --   --   K 3.1* 2.7*  --  3.1*  CL 100* 97*  --   --   CO2 29 34*  --   --   GLUCOSE 277* 81  --   --   BUN 8 11  --   --   CREATININE 0.97 1.05*  --   --   CALCIUM 8.5* 9.2  --   --   MG  --   --   < > 2.3  < > = values in this interval not displayed. Liver Function Tests:  Recent Labs Lab 05/19/16 2217  AST 28  ALT 17  ALKPHOS 80  BILITOT 0.5  PROT 7.5  ALBUMIN 3.4*   No results for input(s): LIPASE, AMYLASE in the last 168 hours. No results for input(s): AMMONIA in the last 168 hours. CBC:  Recent Labs Lab 05/19/16 2217  05/23/16 0541 05/24/16 0440  WBC 9.6  < > 8.2 5.2  NEUTROABS 8.0*  --   --   --   HGB 13.7  < > 10.0* 10.2*  HCT 45.8  < > 34.1* 34.7*  MCV 91.6  < > 88.1 86.3  PLT 340  < > 282 256  < > = values in this interval not displayed. Cardiac Enzymes:  Recent Labs Lab 05/20/16 0647 05/20/16 1206  TROPONINI 1.21* 0.85*   BNP: Invalid input(s): POCBNP CBG:  Recent Labs Lab 05/25/16 1248 05/25/16 1725  GLUCAP 324* 228*    Significant Diagnostic Studies:  Ct Head Wo Contrast  Result Date: 05/20/2016 CLINICAL DATA:  Acute encephalopathy, weakness for 2 weeks EXAM: CT HEAD WITHOUT CONTRAST TECHNIQUE:  Contiguous axial images were obtained from the base of the  skull through the vertex without intravenous contrast. COMPARISON:  None. FINDINGS: Brain: Mild superficial and central atrophy with minimal small vessel ischemic disease of periventricular white matter. No intra-axial mass, hemorrhage or midline. No extra-axial fluid collections. Vascular: Mild atherosclerosis of the carotid siphons no hyperdense vessels. Skull: Negative for fracture or focal lesion. Sinuses/Orbits: The visualized mastoids, paranasal sinuses, orbits and globes are unremarkable. Other: None. IMPRESSION: Mild atrophy with minimal small vessel ischemic disease of periventricular white matter. No acute intracranial appearing abnormality. Electronically Signed   By: Ashley Royalty M.D.   On: 05/20/2016 01:50   Dg Chest Port 1 View  Result Date: 05/20/2016 CLINICAL DATA:  Evaluate central line placement EXAM: PORTABLE CHEST 1 VIEW COMPARISON:  May 19, 2016 FINDINGS: A new right central line terminates near the caval atrial junction. No pneumothorax. Mild bibasilar atelectasis. No other acute abnormality or change. IMPRESSION: The new right central line is in good position.  No pneumothorax. Electronically Signed   By: Dorise Bullion III M.D   On: 05/20/2016 18:32   Dg Chest Port 1 View  Result Date: 05/19/2016 CLINICAL DATA:  Weakness EXAM: PORTABLE CHEST 1 VIEW COMPARISON:  04/25/2016 FINDINGS: The heart size and mediastinal contours are within normal limits. Both lungs are clear. Chronic bilateral healed sixth and seventh rib fractures. IMPRESSION: No active disease. Chronic bilateral sixth and seventh rib fractures. Electronically Signed   By: Ashley Royalty M.D.   On: 05/19/2016 23:06    2D ECHO: Study Conclusions  - Left ventricle: The cavity size was normal. Wall thickness was   increased in a pattern of moderate LVH. Systolic function was   normal. The estimated ejection fraction was in the range of 60%   to 65%. Wall motion  was normal; there were no regional wall   motion abnormalities. Features are consistent with a pseudonormal   left ventricular filling pattern, with concomitant abnormal   relaxation and increased filling pressure (grade 2 diastolic   dysfunction). Doppler parameters are consistent with high   ventricular filling pressure.  Impressions:  - Technically difficult; definity used; normal LV systolic   funciton; moderate diastolic dysfunction with elevated LV filling   pressure; moderate LVH.  Disposition and Follow-up: Discharge Instructions    Diet Carb Modified    Complete by:  As directed    Increase activity slowly    Complete by:  As directed        DISPOSITION: [place] information for follow-up providers    Michel Harrow, PA-C. Schedule an appointment as soon as possible for a visit in 2 week(s).   Specialty:  Physician Assistant Contact information: [place] [address] [phone]        Campbell Riches, MD Follow up on 06/12/2016.   Specialty:  Infectious Diseases Why:  at 4:15 PM  Contact information: [place] [address] [phone]            Contact information for after-discharge care    [facility] Follow up.   Specialty:  [place] information: [address] [place] [phone]                   Time spent on Discharge: 29mins   Signed:   Estill Cotta M.D. [facility] 05/25/2016, 7:35 PM Pager: [phone]

## 2016-05-29 DIAGNOSIS — J440 Chronic obstructive pulmonary disease with acute lower respiratory infection: Secondary | ICD-10-CM | POA: Diagnosis not present

## 2016-05-29 DIAGNOSIS — E118 Type 2 diabetes mellitus with unspecified complications: Secondary | ICD-10-CM | POA: Diagnosis not present

## 2016-05-29 DIAGNOSIS — B20 Human immunodeficiency virus [HIV] disease: Secondary | ICD-10-CM | POA: Diagnosis not present

## 2016-05-29 DIAGNOSIS — J9621 Acute and chronic respiratory failure with hypoxia: Secondary | ICD-10-CM | POA: Diagnosis not present

## 2016-05-30 DIAGNOSIS — J45909 Unspecified asthma, uncomplicated: Secondary | ICD-10-CM | POA: Diagnosis not present

## 2016-05-30 DIAGNOSIS — Z21 Asymptomatic human immunodeficiency virus [HIV] infection status: Secondary | ICD-10-CM | POA: Diagnosis not present

## 2016-05-30 DIAGNOSIS — I739 Peripheral vascular disease, unspecified: Secondary | ICD-10-CM | POA: Diagnosis not present

## 2016-05-30 DIAGNOSIS — I10 Essential (primary) hypertension: Secondary | ICD-10-CM | POA: Diagnosis not present

## 2016-05-30 DIAGNOSIS — J9691 Respiratory failure, unspecified with hypoxia: Secondary | ICD-10-CM | POA: Diagnosis not present

## 2016-06-06 ENCOUNTER — Other Ambulatory Visit: Payer: Self-pay | Admitting: Endocrinology

## 2016-06-07 DIAGNOSIS — E1142 Type 2 diabetes mellitus with diabetic polyneuropathy: Secondary | ICD-10-CM | POA: Diagnosis not present

## 2016-06-07 DIAGNOSIS — J449 Chronic obstructive pulmonary disease, unspecified: Secondary | ICD-10-CM | POA: Diagnosis not present

## 2016-06-07 DIAGNOSIS — J9621 Acute and chronic respiratory failure with hypoxia: Secondary | ICD-10-CM | POA: Diagnosis not present

## 2016-06-07 DIAGNOSIS — B20 Human immunodeficiency virus [HIV] disease: Secondary | ICD-10-CM | POA: Diagnosis not present

## 2016-06-07 DIAGNOSIS — I1310 Hypertensive heart and chronic kidney disease without heart failure, with stage 1 through stage 4 chronic kidney disease, or unspecified chronic kidney disease: Secondary | ICD-10-CM | POA: Diagnosis not present

## 2016-06-07 DIAGNOSIS — E1122 Type 2 diabetes mellitus with diabetic chronic kidney disease: Secondary | ICD-10-CM | POA: Diagnosis not present

## 2016-06-07 DIAGNOSIS — J45901 Unspecified asthma with (acute) exacerbation: Secondary | ICD-10-CM | POA: Diagnosis not present

## 2016-06-07 DIAGNOSIS — N184 Chronic kidney disease, stage 4 (severe): Secondary | ICD-10-CM | POA: Diagnosis not present

## 2016-06-07 DIAGNOSIS — I252 Old myocardial infarction: Secondary | ICD-10-CM | POA: Diagnosis not present

## 2016-06-08 DIAGNOSIS — J45901 Unspecified asthma with (acute) exacerbation: Secondary | ICD-10-CM | POA: Diagnosis not present

## 2016-06-08 DIAGNOSIS — I252 Old myocardial infarction: Secondary | ICD-10-CM | POA: Diagnosis not present

## 2016-06-08 DIAGNOSIS — J9621 Acute and chronic respiratory failure with hypoxia: Secondary | ICD-10-CM | POA: Diagnosis not present

## 2016-06-08 DIAGNOSIS — E1142 Type 2 diabetes mellitus with diabetic polyneuropathy: Secondary | ICD-10-CM | POA: Diagnosis not present

## 2016-06-08 DIAGNOSIS — I1310 Hypertensive heart and chronic kidney disease without heart failure, with stage 1 through stage 4 chronic kidney disease, or unspecified chronic kidney disease: Secondary | ICD-10-CM | POA: Diagnosis not present

## 2016-06-08 DIAGNOSIS — J449 Chronic obstructive pulmonary disease, unspecified: Secondary | ICD-10-CM | POA: Diagnosis not present

## 2016-06-08 DIAGNOSIS — N184 Chronic kidney disease, stage 4 (severe): Secondary | ICD-10-CM | POA: Diagnosis not present

## 2016-06-08 DIAGNOSIS — B20 Human immunodeficiency virus [HIV] disease: Secondary | ICD-10-CM | POA: Diagnosis not present

## 2016-06-08 DIAGNOSIS — E1122 Type 2 diabetes mellitus with diabetic chronic kidney disease: Secondary | ICD-10-CM | POA: Diagnosis not present

## 2016-06-12 ENCOUNTER — Ambulatory Visit (INDEPENDENT_AMBULATORY_CARE_PROVIDER_SITE_OTHER): Payer: Medicare HMO | Admitting: Infectious Diseases

## 2016-06-12 ENCOUNTER — Encounter: Payer: Self-pay | Admitting: Infectious Diseases

## 2016-06-12 VITALS — BP 124/79 | HR 94 | Temp 98.3°F | Ht 70.0 in | Wt 236.0 lb

## 2016-06-12 DIAGNOSIS — B20 Human immunodeficiency virus [HIV] disease: Secondary | ICD-10-CM | POA: Diagnosis not present

## 2016-06-12 DIAGNOSIS — Z89512 Acquired absence of left leg below knee: Secondary | ICD-10-CM | POA: Diagnosis not present

## 2016-06-12 DIAGNOSIS — G8929 Other chronic pain: Secondary | ICD-10-CM | POA: Diagnosis not present

## 2016-06-12 DIAGNOSIS — G4700 Insomnia, unspecified: Secondary | ICD-10-CM

## 2016-06-12 DIAGNOSIS — E662 Morbid (severe) obesity with alveolar hypoventilation: Secondary | ICD-10-CM | POA: Diagnosis not present

## 2016-06-12 DIAGNOSIS — M47816 Spondylosis without myelopathy or radiculopathy, lumbar region: Secondary | ICD-10-CM | POA: Diagnosis not present

## 2016-06-12 DIAGNOSIS — Z79899 Other long term (current) drug therapy: Secondary | ICD-10-CM

## 2016-06-12 DIAGNOSIS — Z113 Encounter for screening for infections with a predominantly sexual mode of transmission: Secondary | ICD-10-CM | POA: Diagnosis not present

## 2016-06-12 DIAGNOSIS — G546 Phantom limb syndrome with pain: Secondary | ICD-10-CM | POA: Diagnosis not present

## 2016-06-12 DIAGNOSIS — Z0000 Encounter for general adult medical examination without abnormal findings: Secondary | ICD-10-CM | POA: Diagnosis not present

## 2016-06-12 DIAGNOSIS — F39 Unspecified mood [affective] disorder: Secondary | ICD-10-CM | POA: Diagnosis not present

## 2016-06-12 HISTORY — DX: Insomnia, unspecified: G47.00

## 2016-06-12 LAB — CBC WITH DIFFERENTIAL/PLATELET
BASOS PCT: 0 %
Basophils Absolute: 0 cells/uL (ref 0–200)
EOS ABS: 410 {cells}/uL (ref 15–500)
Eosinophils Relative: 2 %
HEMATOCRIT: 40.2 % (ref 35.0–45.0)
Hemoglobin: 12.5 g/dL (ref 11.7–15.5)
Lymphocytes Relative: 9 %
Lymphs Abs: 1845 cells/uL (ref 850–3900)
MCH: 26.7 pg — ABNORMAL LOW (ref 27.0–33.0)
MCHC: 31.1 g/dL — ABNORMAL LOW (ref 32.0–36.0)
MCV: 85.9 fL (ref 80.0–100.0)
MONO ABS: 820 {cells}/uL (ref 200–950)
MPV: 10.6 fL (ref 7.5–12.5)
Monocytes Relative: 4 %
NEUTROS ABS: 17425 {cells}/uL — AB (ref 1500–7800)
Neutrophils Relative %: 85 %
PLATELETS: 292 10*3/uL (ref 140–400)
RBC: 4.68 MIL/uL (ref 3.80–5.10)
RDW: 20.3 % — ABNORMAL HIGH (ref 11.0–15.0)
WBC: 20.5 10*3/uL — ABNORMAL HIGH (ref 3.8–10.8)

## 2016-06-12 MED ORDER — ESZOPICLONE 3 MG PO TABS: 3.0000 mg | ORAL_TABLET | Freq: Every evening | ORAL | 3 refills | Status: DC | PRN

## 2016-06-12 MED ORDER — DIPHENOXYLATE-ATROPINE 2.5-0.025 MG PO TABS: 1.0000 | ORAL_TABLET | Freq: Four times a day (QID) | ORAL | 3 refills | Status: DC | PRN
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
## 2016-06-12 NOTE — Assessment & Plan Note (Signed)
Has mild wheeze today.  She will f/u with PCP.

## 2016-06-12 NOTE — Assessment & Plan Note (Signed)
Will refill her lunesta.

## 2016-06-12 NOTE — Assessment & Plan Note (Addendum)
She will f/u with sherrie in AM.  She denies SI

## 2016-06-12 NOTE — Assessment & Plan Note (Signed)
Doing well with genvoya Will continue Will refill her lomotil. Labs pending.   rtc in 6 months.

## 2016-06-13 ENCOUNTER — Telehealth: Payer: Self-pay | Admitting: Licensed Clinical Social Worker

## 2016-06-13 LAB — COMPLETE METABOLIC PANEL WITH GFR
ALT: 8 U/L (ref 6–29)
AST: 9 U/L — ABNORMAL LOW (ref 10–35)
Albumin: 3.5 g/dL — ABNORMAL LOW (ref 3.6–5.1)
Alkaline Phosphatase: 60 U/L (ref 33–130)
BUN: 20 mg/dL (ref 7–25)
CHLORIDE: 99 mmol/L (ref 98–110)
CO2: 26 mmol/L (ref 20–31)
Calcium: 9.1 mg/dL (ref 8.6–10.4)
Creat: 1.61 mg/dL — ABNORMAL HIGH (ref 0.50–1.05)
GFR, EST NON AFRICAN AMERICAN: 37 mL/min — AB (ref >=60)
GFR, Est African American: 42 mL/min — ABNORMAL LOW (ref >=60)
Glucose, Bld: 213 mg/dL — ABNORMAL HIGH (ref 65–99)
POTASSIUM: 4.8 mmol/L (ref 3.5–5.3)
Sodium: 136 mmol/L (ref 135–146)
Total Bilirubin: 0.3 mg/dL (ref 0.2–1.2)
Total Protein: 6.1 g/dL (ref 6.1–8.1)

## 2016-06-13 LAB — LIPID PANEL
CHOL/HDL RATIO: 2.8 ratio (ref <5.0)
Cholesterol: 139 mg/dL (ref <200)
HDL: 49 mg/dL — ABNORMAL LOW (ref >50)
LDL CALC: 59 mg/dL (ref <100)
Triglycerides: 154 mg/dL — ABNORMAL HIGH (ref <150)
VLDL: 31 mg/dL — ABNORMAL HIGH (ref <30)

## 2016-06-13 LAB — RPR

## 2016-06-13 NOTE — Telephone Encounter (Signed)
Left message on voice mail asking for return call to arrange office visit.  Sande Rives, [facility]

## 2016-06-14 LAB — T-HELPER CELL (CD4) - (RCID CLINIC ONLY)
CD4 T CELL HELPER: 21 % — AB (ref 33–55)
CD4 T Cell Abs: 430 /uL (ref 400–2700)

## 2016-06-15 DIAGNOSIS — J9621 Acute and chronic respiratory failure with hypoxia: Secondary | ICD-10-CM | POA: Diagnosis not present

## 2016-06-15 DIAGNOSIS — E1142 Type 2 diabetes mellitus with diabetic polyneuropathy: Secondary | ICD-10-CM | POA: Diagnosis not present

## 2016-06-15 DIAGNOSIS — B20 Human immunodeficiency virus [HIV] disease: Secondary | ICD-10-CM | POA: Diagnosis not present

## 2016-06-15 DIAGNOSIS — J45901 Unspecified asthma with (acute) exacerbation: Secondary | ICD-10-CM | POA: Diagnosis not present

## 2016-06-15 DIAGNOSIS — I1310 Hypertensive heart and chronic kidney disease without heart failure, with stage 1 through stage 4 chronic kidney disease, or unspecified chronic kidney disease: Secondary | ICD-10-CM | POA: Diagnosis not present

## 2016-06-15 DIAGNOSIS — R0902 Hypoxemia: Secondary | ICD-10-CM | POA: Diagnosis not present

## 2016-06-15 DIAGNOSIS — I252 Old myocardial infarction: Secondary | ICD-10-CM | POA: Diagnosis not present

## 2016-06-15 DIAGNOSIS — J449 Chronic obstructive pulmonary disease, unspecified: Secondary | ICD-10-CM | POA: Diagnosis not present

## 2016-06-15 DIAGNOSIS — E1122 Type 2 diabetes mellitus with diabetic chronic kidney disease: Secondary | ICD-10-CM | POA: Diagnosis not present

## 2016-06-15 DIAGNOSIS — N184 Chronic kidney disease, stage 4 (severe): Secondary | ICD-10-CM | POA: Diagnosis not present

## 2016-06-16 LAB — HIV-1 RNA QUANT-NO REFLEX-BLD
HIV 1 RNA Quant: <20 copies/mL
HIV-1 RNA QUANT, LOG: NOT DETECTED {Log_copies}/mL

## 2016-06-18 ENCOUNTER — Inpatient Hospital Stay (HOSPITAL_COMMUNITY)
Admission: EM | Admit: 2016-06-18 | Discharge: 2016-06-20 | DRG: 190 | Disposition: A | Discharge status: Home Health | Payer: Medicare HMO | Attending: Internal Medicine | Admitting: Internal Medicine

## 2016-06-18 ENCOUNTER — Emergency Department (HOSPITAL_COMMUNITY): Payer: Medicare HMO

## 2016-06-18 ENCOUNTER — Encounter (HOSPITAL_COMMUNITY): Payer: Self-pay | Admitting: Nurse Practitioner

## 2016-06-18 DIAGNOSIS — B20 Human immunodeficiency virus [HIV] disease: Secondary | ICD-10-CM | POA: Diagnosis present

## 2016-06-18 DIAGNOSIS — F17210 Nicotine dependence, cigarettes, uncomplicated: Secondary | ICD-10-CM | POA: Diagnosis present

## 2016-06-18 DIAGNOSIS — Z89512 Acquired absence of left leg below knee: Secondary | ICD-10-CM

## 2016-06-18 DIAGNOSIS — J9600 Acute respiratory failure, unspecified whether with hypoxia or hypercapnia: Secondary | ICD-10-CM | POA: Diagnosis not present

## 2016-06-18 DIAGNOSIS — J441 Chronic obstructive pulmonary disease with (acute) exacerbation: Secondary | ICD-10-CM | POA: Diagnosis not present

## 2016-06-18 DIAGNOSIS — K219 Gastro-esophageal reflux disease without esophagitis: Secondary | ICD-10-CM | POA: Diagnosis present

## 2016-06-18 DIAGNOSIS — T380X5A Adverse effect of glucocorticoids and synthetic analogues, initial encounter: Secondary | ICD-10-CM | POA: Diagnosis present

## 2016-06-18 DIAGNOSIS — Z96642 Presence of left artificial hip joint: Secondary | ICD-10-CM | POA: Diagnosis present

## 2016-06-18 DIAGNOSIS — N179 Acute kidney failure, unspecified: Secondary | ICD-10-CM | POA: Diagnosis present

## 2016-06-18 DIAGNOSIS — I10 Essential (primary) hypertension: Secondary | ICD-10-CM

## 2016-06-18 DIAGNOSIS — Z993 Dependence on wheelchair: Secondary | ICD-10-CM

## 2016-06-18 DIAGNOSIS — I5031 Acute diastolic (congestive) heart failure: Secondary | ICD-10-CM | POA: Diagnosis not present

## 2016-06-18 DIAGNOSIS — Z888 Allergy status to other drugs, medicaments and biological substances status: Secondary | ICD-10-CM

## 2016-06-18 DIAGNOSIS — I5033 Acute on chronic diastolic (congestive) heart failure: Secondary | ICD-10-CM | POA: Diagnosis present

## 2016-06-18 DIAGNOSIS — Z79891 Long term (current) use of opiate analgesic: Secondary | ICD-10-CM

## 2016-06-18 DIAGNOSIS — N183 Chronic kidney disease, stage 3 (moderate): Secondary | ICD-10-CM | POA: Diagnosis present

## 2016-06-18 DIAGNOSIS — E1142 Type 2 diabetes mellitus with diabetic polyneuropathy: Secondary | ICD-10-CM | POA: Diagnosis present

## 2016-06-18 DIAGNOSIS — J4541 Moderate persistent asthma with (acute) exacerbation: Secondary | ICD-10-CM | POA: Diagnosis not present

## 2016-06-18 DIAGNOSIS — J45901 Unspecified asthma with (acute) exacerbation: Secondary | ICD-10-CM | POA: Diagnosis present

## 2016-06-18 DIAGNOSIS — R069 Unspecified abnormalities of breathing: Secondary | ICD-10-CM | POA: Diagnosis not present

## 2016-06-18 DIAGNOSIS — R0602 Shortness of breath: Secondary | ICD-10-CM | POA: Diagnosis not present

## 2016-06-18 DIAGNOSIS — Z794 Long term (current) use of insulin: Secondary | ICD-10-CM | POA: Diagnosis not present

## 2016-06-18 DIAGNOSIS — E669 Obesity, unspecified: Secondary | ICD-10-CM | POA: Diagnosis present

## 2016-06-18 DIAGNOSIS — F329 Major depressive disorder, single episode, unspecified: Secondary | ICD-10-CM | POA: Diagnosis present

## 2016-06-18 DIAGNOSIS — Z6833 Body mass index (BMI) 33.0-33.9, adult: Secondary | ICD-10-CM | POA: Diagnosis not present

## 2016-06-18 DIAGNOSIS — F17290 Nicotine dependence, other tobacco product, uncomplicated: Secondary | ICD-10-CM | POA: Diagnosis present

## 2016-06-18 DIAGNOSIS — G629 Polyneuropathy, unspecified: Secondary | ICD-10-CM | POA: Diagnosis present

## 2016-06-18 DIAGNOSIS — J9621 Acute and chronic respiratory failure with hypoxia: Secondary | ICD-10-CM | POA: Diagnosis not present

## 2016-06-18 DIAGNOSIS — F419 Anxiety disorder, unspecified: Secondary | ICD-10-CM | POA: Diagnosis present

## 2016-06-18 DIAGNOSIS — I5032 Chronic diastolic (congestive) heart failure: Secondary | ICD-10-CM | POA: Diagnosis not present

## 2016-06-18 DIAGNOSIS — I130 Hypertensive heart and chronic kidney disease with heart failure and stage 1 through stage 4 chronic kidney disease, or unspecified chronic kidney disease: Secondary | ICD-10-CM | POA: Diagnosis not present

## 2016-06-18 DIAGNOSIS — R739 Hyperglycemia, unspecified: Secondary | ICD-10-CM | POA: Diagnosis not present

## 2016-06-18 DIAGNOSIS — J40 Bronchitis, not specified as acute or chronic: Secondary | ICD-10-CM | POA: Diagnosis present

## 2016-06-18 DIAGNOSIS — E1122 Type 2 diabetes mellitus with diabetic chronic kidney disease: Secondary | ICD-10-CM | POA: Diagnosis not present

## 2016-06-18 DIAGNOSIS — E118 Type 2 diabetes mellitus with unspecified complications: Secondary | ICD-10-CM | POA: Diagnosis not present

## 2016-06-18 DIAGNOSIS — E1165 Type 2 diabetes mellitus with hyperglycemia: Secondary | ICD-10-CM | POA: Diagnosis not present

## 2016-06-18 DIAGNOSIS — Z79899 Other long term (current) drug therapy: Secondary | ICD-10-CM

## 2016-06-18 DIAGNOSIS — Z7951 Long term (current) use of inhaled steroids: Secondary | ICD-10-CM

## 2016-06-18 DIAGNOSIS — Z89511 Acquired absence of right leg below knee: Secondary | ICD-10-CM | POA: Diagnosis not present

## 2016-06-18 DIAGNOSIS — Z7982 Long term (current) use of aspirin: Secondary | ICD-10-CM

## 2016-06-18 DIAGNOSIS — J9601 Acute respiratory failure with hypoxia: Secondary | ICD-10-CM | POA: Diagnosis not present

## 2016-06-18 DIAGNOSIS — E119 Type 2 diabetes mellitus without complications: Secondary | ICD-10-CM | POA: Diagnosis not present

## 2016-06-18 DIAGNOSIS — Z8249 Family history of ischemic heart disease and other diseases of the circulatory system: Secondary | ICD-10-CM

## 2016-06-18 DIAGNOSIS — I517 Cardiomegaly: Secondary | ICD-10-CM | POA: Diagnosis not present

## 2016-06-18 LAB — COMPREHENSIVE METABOLIC PANEL
ALT: 19 U/L (ref 14–54)
AST: 23 U/L (ref 15–41)
Albumin: 2.9 g/dL — ABNORMAL LOW (ref 3.5–5.0)
Alkaline Phosphatase: 93 U/L (ref 38–126)
Anion gap: 12 (ref 5–15)
BUN: 27 mg/dL — ABNORMAL HIGH (ref 6–20)
CHLORIDE: 100 mmol/L — AB (ref 101–111)
CO2: 25 mmol/L (ref 22–32)
CREATININE: 1.44 mg/dL — AB (ref 0.44–1.00)
Calcium: 8.7 mg/dL — ABNORMAL LOW (ref 8.9–10.3)
GFR calc Af Amer: 47 mL/min — ABNORMAL LOW (ref >60)
GFR, EST NON AFRICAN AMERICAN: 41 mL/min — AB (ref >60)
Glucose, Bld: 335 mg/dL — ABNORMAL HIGH (ref 65–99)
Potassium: 4.3 mmol/L (ref 3.5–5.1)
SODIUM: 137 mmol/L (ref 135–145)
Total Bilirubin: 1 mg/dL (ref 0.3–1.2)
Total Protein: 7.1 g/dL (ref 6.5–8.1)

## 2016-06-18 LAB — CBC WITH DIFFERENTIAL/PLATELET
BASOS PCT: 1 %
Basophils Absolute: 0.1 10*3/uL (ref 0.0–0.1)
EOS ABS: 0.7 10*3/uL (ref 0.0–0.7)
Eosinophils Relative: 6 %
HCT: 36.6 % (ref 36.0–46.0)
Hemoglobin: 11.9 g/dL — ABNORMAL LOW (ref 12.0–15.0)
LYMPHS PCT: 15 %
Lymphs Abs: 1.7 10*3/uL (ref 0.7–4.0)
MCH: 27.2 pg (ref 26.0–34.0)
MCHC: 32.5 g/dL (ref 30.0–36.0)
MCV: 83.6 fL (ref 78.0–100.0)
MONOS PCT: 5 %
Monocytes Absolute: 0.6 10*3/uL (ref 0.1–1.0)
NEUTROS PCT: 73 %
Neutro Abs: 8.2 10*3/uL — ABNORMAL HIGH (ref 1.7–7.7)
PLATELETS: UNDETERMINED 10*3/uL (ref 150–400)
RBC: 4.38 MIL/uL (ref 3.87–5.11)
RDW: 21.6 % — ABNORMAL HIGH (ref 11.5–15.5)
WBC: 11.3 10*3/uL — ABNORMAL HIGH (ref 4.0–10.5)

## 2016-06-18 LAB — GLUCOSE, RANDOM: Glucose, Bld: 814 mg/dL (ref 65–99)

## 2016-06-18 LAB — GLUCOSE, CAPILLARY: Glucose-Capillary: >600 mg/dL (ref 65–99)

## 2016-06-18 LAB — BRAIN NATRIURETIC PEPTIDE: B NATRIURETIC PEPTIDE 5: 592.7 pg/mL — AB (ref 0.0–100.0)

## 2016-06-18 LAB — TROPONIN I: Troponin I: <0.03 ng/mL (ref <0.03)

## 2016-06-18 MED ORDER — FUROSEMIDE 40 MG PO TABS
80.0000 mg | ORAL_TABLET | Freq: Every day | ORAL | Status: DC
  Administered 2016-06-19 – 2016-06-20 (×2): 80 mg via ORAL
  Filled 2016-06-18 (×2): qty 2

## 2016-06-18 MED ORDER — ASPIRIN EC 81 MG PO TBEC
81.0000 mg | DELAYED_RELEASE_TABLET | Freq: Every day | ORAL | Status: DC
  Administered 2016-06-19 – 2016-06-20 (×2): 81 mg via ORAL
  Filled 2016-06-18 (×2): qty 1

## 2016-06-18 MED ORDER — ROSUVASTATIN CALCIUM 10 MG PO TABS
20.0000 mg | ORAL_TABLET | Freq: Every day | ORAL | Status: DC
  Administered 2016-06-19 – 2016-06-20 (×2): 20 mg via ORAL
  Filled 2016-06-18 (×2): qty 2

## 2016-06-18 MED ORDER — INSULIN ASPART 100 UNIT/ML ~~LOC~~ SOLN
20.0000 [IU] | Freq: Once | SUBCUTANEOUS | Status: DC
  Administered 2016-06-18: 20 [IU] via SUBCUTANEOUS

## 2016-06-18 MED ORDER — ACETAMINOPHEN 325 MG PO TABS: 650.0000 mg | ORAL_TABLET | Freq: Four times a day (QID) | ORAL | Status: DC | PRN

## 2016-06-18 MED ORDER — INSULIN GLARGINE 100 UNIT/ML ~~LOC~~ SOLN
70.0000 [IU] | Freq: Two times a day (BID) | SUBCUTANEOUS | Status: DC
  Administered 2016-06-18 – 2016-06-19 (×2): 70 [IU] via SUBCUTANEOUS
  Filled 2016-06-18 (×3): qty 0.7

## 2016-06-18 MED ORDER — HYDROCODONE-ACETAMINOPHEN 10-325 MG PO TABS
1.0000 | ORAL_TABLET | Freq: Three times a day (TID) | ORAL | Status: DC | PRN
  Administered 2016-06-18 – 2016-06-20 (×2): 1 via ORAL
  Filled 2016-06-18 (×2): qty 1

## 2016-06-18 MED ORDER — INSULIN REGULAR HUMAN (CONC) 500 UNIT/ML ~~LOC~~ SOPN: 50.0000 [IU] | PEN_INJECTOR | Freq: Every day | SUBCUTANEOUS | Status: DC

## 2016-06-18 MED ORDER — FUROSEMIDE 10 MG/ML IJ SOLN
80.0000 mg | Freq: Once | INTRAMUSCULAR | Status: AC
  Administered 2016-06-18: 80 mg via INTRAVENOUS
  Filled 2016-06-18: qty 8

## 2016-06-18 MED ORDER — ZOLPIDEM TARTRATE 5 MG PO TABS
5.0000 mg | ORAL_TABLET | Freq: Every evening | ORAL | Status: DC | PRN
  Administered 2016-06-18 – 2016-06-19 (×3): 5 mg via ORAL
  Filled 2016-06-18 (×3): qty 1

## 2016-06-18 MED ORDER — BUDESONIDE 0.25 MG/2ML IN SUSP
0.2500 mg | Freq: Two times a day (BID) | RESPIRATORY_TRACT | Status: DC
  Administered 2016-06-18 – 2016-06-20 (×4): 0.25 mg via RESPIRATORY_TRACT
  Filled 2016-06-18 (×4): qty 2

## 2016-06-18 MED ORDER — AMOXICILLIN-POT CLAVULANATE 875-125 MG PO TABS
1.0000 | ORAL_TABLET | Freq: Two times a day (BID) | ORAL | Status: DC
  Administered 2016-06-18 – 2016-06-20 (×4): 1 via ORAL
  Filled 2016-06-18 (×4): qty 1

## 2016-06-18 MED ORDER — LIRAGLUTIDE 18 MG/3ML ~~LOC~~ SOPN: 1.8000 mg | PEN_INJECTOR | Freq: Every day | SUBCUTANEOUS | Status: DC

## 2016-06-18 MED ORDER — ENSURE ENLIVE PO LIQD
237.0000 mL | Freq: Two times a day (BID) | ORAL | Status: DC
  Administered 2016-06-19: 237 mL via ORAL

## 2016-06-18 MED ORDER — INSULIN ASPART 100 UNIT/ML ~~LOC~~ SOLN: 0.0000 [IU] | SUBCUTANEOUS | Status: DC

## 2016-06-18 MED ORDER — INSULIN REGULAR HUMAN (CONC) 500 UNIT/ML ~~LOC~~ SOLN: 50.0000 [IU] | Freq: Every day | SUBCUTANEOUS | Status: DC

## 2016-06-18 MED ORDER — NYSTATIN 100000 UNIT/ML MT SUSP
5.0000 mL | Freq: Four times a day (QID) | OROMUCOSAL | Status: DC
  Administered 2016-06-18 – 2016-06-19 (×3): 500000 [IU] via ORAL
  Filled 2016-06-18 (×5): qty 5

## 2016-06-18 MED ORDER — DIPHENOXYLATE-ATROPINE 2.5-0.025 MG PO TABS
1.0000 | ORAL_TABLET | Freq: Four times a day (QID) | ORAL | Status: DC | PRN
  Administered 2016-06-18 – 2016-06-20 (×5): 1 via ORAL
  Filled 2016-06-18 (×5): qty 1

## 2016-06-18 MED ORDER — ALBUTEROL SULFATE (2.5 MG/3ML) 0.083% IN NEBU: 2.5000 mg | INHALATION_SOLUTION | RESPIRATORY_TRACT | Status: DC | PRN

## 2016-06-18 MED ORDER — SODIUM CHLORIDE 0.9 % IV SOLN
INTRAVENOUS | Status: DC
  Administered 2016-06-18: 10 mL/h via INTRAVENOUS

## 2016-06-18 MED ORDER — ELVITEG-COBIC-EMTRICIT-TENOFAF 150-150-200-10 MG PO TABS
1.0000 | ORAL_TABLET | Freq: Every day | ORAL | Status: DC
  Administered 2016-06-19 – 2016-06-20 (×2): 1 via ORAL
  Filled 2016-06-18 (×4): qty 1

## 2016-06-18 MED ORDER — INSULIN REGULAR HUMAN (CONC) 500 UNIT/ML ~~LOC~~ SOLN: 35.0000 [IU] | Freq: Every day | SUBCUTANEOUS | Status: DC

## 2016-06-18 MED ORDER — INSULIN REGULAR HUMAN (CONC) 500 UNIT/ML ~~LOC~~ SOPN
60.0000 [IU] | PEN_INJECTOR | Freq: Every day | SUBCUTANEOUS | Status: DC
  Filled 2016-06-18: qty 3

## 2016-06-18 MED ORDER — INSULIN REGULAR HUMAN (CONC) 500 UNIT/ML ~~LOC~~ SOPN: 35.0000 [IU] | PEN_INJECTOR | Freq: Three times a day (TID) | SUBCUTANEOUS | Status: DC

## 2016-06-18 MED ORDER — IPRATROPIUM-ALBUTEROL 0.5-2.5 (3) MG/3ML IN SOLN
3.0000 mL | RESPIRATORY_TRACT | Status: DC
  Administered 2016-06-18 – 2016-06-19 (×7): 3 mL via RESPIRATORY_TRACT
  Filled 2016-06-18 (×7): qty 3

## 2016-06-18 MED ORDER — ONDANSETRON HCL 4 MG/2ML IJ SOLN: 4.0000 mg | Freq: Four times a day (QID) | INTRAMUSCULAR | Status: DC | PRN

## 2016-06-18 MED ORDER — POTASSIUM CHLORIDE CRYS ER 20 MEQ PO TBCR
20.0000 meq | EXTENDED_RELEASE_TABLET | Freq: Every day | ORAL | Status: DC
  Administered 2016-06-19 – 2016-06-20 (×2): 20 meq via ORAL
  Filled 2016-06-18 (×2): qty 1

## 2016-06-18 MED ORDER — ACETAMINOPHEN 650 MG RE SUPP: 650.0000 mg | Freq: Four times a day (QID) | RECTAL | Status: DC | PRN

## 2016-06-18 MED ORDER — ONDANSETRON HCL 4 MG PO TABS: 4.0000 mg | ORAL_TABLET | Freq: Four times a day (QID) | ORAL | Status: DC | PRN

## 2016-06-18 MED ORDER — ALBUTEROL SULFATE (2.5 MG/3ML) 0.083% IN NEBU
5.0000 mg | INHALATION_SOLUTION | Freq: Once | RESPIRATORY_TRACT | Status: AC
  Administered 2016-06-18: 5 mg via RESPIRATORY_TRACT
  Filled 2016-06-18: qty 6

## 2016-06-18 MED ORDER — INSULIN REGULAR HUMAN (CONC) 500 UNIT/ML ~~LOC~~ SOLN
60.0000 [IU] | Freq: Every day | SUBCUTANEOUS | Status: DC
  Filled 2016-06-18: qty 20

## 2016-06-18 MED ORDER — PREGABALIN 75 MG PO CAPS
225.0000 mg | ORAL_CAPSULE | Freq: Every day | ORAL | Status: DC
  Administered 2016-06-19 – 2016-06-20 (×2): 225 mg via ORAL
  Filled 2016-06-18 (×2): qty 3

## 2016-06-18 MED ORDER — INSULIN REGULAR HUMAN (CONC) 500 UNIT/ML ~~LOC~~ SOPN
35.0000 [IU] | PEN_INJECTOR | Freq: Every day | SUBCUTANEOUS | Status: DC
  Administered 2016-06-18: 35 [IU] via SUBCUTANEOUS
  Filled 2016-06-18: qty 3

## 2016-06-18 MED ORDER — METHYLPREDNISOLONE SODIUM SUCC 125 MG IJ SOLR
60.0000 mg | Freq: Two times a day (BID) | INTRAMUSCULAR | Status: DC
  Administered 2016-06-18 – 2016-06-20 (×4): 60 mg via INTRAVENOUS
  Filled 2016-06-18 (×4): qty 2

## 2016-06-18 MED ORDER — ATENOLOL 25 MG PO TABS
25.0000 mg | ORAL_TABLET | Freq: Every day | ORAL | Status: DC
  Administered 2016-06-19 – 2016-06-20 (×2): 25 mg via ORAL
  Filled 2016-06-18 (×2): qty 1

## 2016-06-18 MED ORDER — PANTOPRAZOLE SODIUM 40 MG PO TBEC
40.0000 mg | DELAYED_RELEASE_TABLET | Freq: Every day | ORAL | Status: DC
  Administered 2016-06-19 – 2016-06-20 (×2): 40 mg via ORAL
  Filled 2016-06-18 (×2): qty 1

## 2016-06-18 MED ORDER — HEPARIN SODIUM (PORCINE) 5000 UNIT/ML IJ SOLN
5000.0000 [IU] | Freq: Three times a day (TID) | INTRAMUSCULAR | Status: DC
  Administered 2016-06-18 – 2016-06-20 (×5): 5000 [IU] via SUBCUTANEOUS
  Filled 2016-06-18 (×4): qty 1

## 2016-06-18 MED ORDER — ARFORMOTEROL TARTRATE 15 MCG/2ML IN NEBU
15.0000 ug | INHALATION_SOLUTION | Freq: Two times a day (BID) | RESPIRATORY_TRACT | Status: DC
  Administered 2016-06-18 – 2016-06-20 (×4): 15 ug via RESPIRATORY_TRACT
  Filled 2016-06-18 (×4): qty 2

## 2016-06-18 NOTE — Progress Notes (Signed)
1081ml bolus started

## 2016-06-18 NOTE — Care Management (Signed)
ED CM reviewed CM consult for medication costs. Patient is being admitted. Venita Sheffield RN CCM

## 2016-06-18 NOTE — ED Provider Notes (Addendum)
[facility] DEPT Provider Note   CSN: [id number] Arrival date & time: 06/18/16  1259     History   Chief Complaint No chief complaint on file.   HPI [name] is a 51 y.o. female.  51 year old female presents with acute shortness of breath 2 days. Does have a history of asthma as well as HIV and called EMS due to worsening dyspnea. Was found to be in respiratory distress and was medicated with 10 mg albuterol along with Atrovent. She was also given 2 g magnesium as well as 125 mg of Solu-Medrol and transported here. She denies any recent fever or chills. Denies any CHF type symptoms. Feels better after the breathing treatment.      Past Medical History:  Diagnosis Date  . Anxiety   . Arthritis    LEFT HIP  . Asthma    HOSPITALIZED WITH EXCERBATION OF ASTHMA - AND BRONCHITIS AND THE FLU DEC 2013  . Depression   . Diabetes mellitus    ON INSULIN AND ORAL MEDICATIONS  . GERD (gastroesophageal reflux disease)   . HIV infection ([place]) 2000  . Hypertension   . Neuropathy    NEUROPATHY HANDS AND FEET  . Pain    SEVERE PAIN LEFT HIP - HX OF LEFT HIP PINNING JULY 2013  . Shortness of breath    ALLERGIES ARE "ACTING UP"    Patient Active Problem List   Diagnosis Date Noted  . Insomnia 06/12/2016  . Difficult intravenous access   . Acute on chronic respiratory failure with hypoxia ([place]) 05/19/2016  . NSTEMI (non-ST elevated myocardial infarction) ([place]) 05/19/2016  . Acute metabolic encephalopathy 43/15/4008  . Diabetic ketoacidosis without coma associated with type 2 diabetes mellitus ([place])   . SOB (shortness of breath)   . Hyperlipidemia   . Class 3 obesity due to excess calories with serious comorbidity and body mass index (BMI) of 40.0 to 44.9 in adult [facility])   . Obesity hypoventilation syndrome ([place])   . DKA, type 2 ([place]) 04/04/2016  . Diastolic dysfunction 67/61/9509  . Diabetic neuropathy ([place]) 11/10/2014  . Chronic kidney disease (CKD), stage IV  (severe) ([place]) 11/10/2014  . Fracture of distal femur ([place]) 07/08/2014  . Osteoporosis 07/08/2014  . Femur fracture, left ([place]) 07/08/2014  . Colles' fracture of left radius   . Left hip pain 05/03/2014  . Hip pain   . Perimenopausal symptoms 02/13/2012  . Hyponatremia 12/29/2011  . Asthma exacerbation attacks 12/28/2011  . Bronchitis 12/28/2011  . Repeated falls 07/30/2011  . Hip fracture, left ([place]) 07/16/2011  . Femoral neck fracture ([place]) 07/16/2011  . Dyslipidemia 12/23/2008  . THRUSH 05/20/2008  . Human immunodeficiency virus (HIV) disease ([place]) 04/19/2008  . Diabetes mellitus type 2 with complications, uncontrolled ([place]) 04/19/2008  . Mood disorder ([place]) 04/19/2008  . Hereditary and idiopathic peripheral neuropathy 04/19/2008  . Essential hypertension 04/19/2008    Past Surgical History:  Procedure Laterality Date  . AMPUTATION Left 11/18/2014   Procedure: LEFT BELOW KNEE AMPUTATION;  Surgeon: Leandrew Koyanagi, MD;  Location: [facility];  Service: Orthopedics;  Laterality: Left;  . CAST APPLICATION Left 03/03/6710   Procedure: CAST APPLICATION (FIBERGLASS);  Surgeon: Latanya Maudlin, MD;  Location: [facility];  Service: Orthopedics;  Laterality: Left;  CLOSED REDUCTION OF LEFT COLLES FRACTURE WITH SHORT ARM CAST  . HARDWARE REMOVAL Left 05/05/2014   Procedure: HARDWARE REMOVAL LEFT HIP;  Surgeon: Latanya Maudlin, MD;  Location: [facility];  Service: Orthopedics;  Laterality: Left;  REMOVAL BIOMET  6.5-8.0 CANNULATED SCREW  . HIP ARTHROPLASTY Left 05/05/2014   Procedure: ARTHROPLASTY OPEN REDUCTION INTERNAL FIXATION LEFT HIP AND REMOVAL OF TWO CANNULATED SCREW;  Surgeon: Latanya Maudlin, MD;  Location: [facility];  Service: Orthopedics;  Laterality: Left;  . HIP PINNING,CANNULATED  07/16/2011   Procedure: CANNULATED HIP PINNING;  Surgeon: Gearlean Alf, MD;  Location: [facility];  Service: Orthopedics;  Laterality: Left;  . HIP PINNING,CANNULATED Left 04/09/2012   Procedure: CANNULATED HIP PINNING AND HARDWARE  REVISION;  Surgeon: Gearlean Alf, MD;  Location: [facility];  Service: Orthopedics;  Laterality: Left;  . I&D EXTREMITY Left 11/16/2014   Procedure:  DEBRIDEMENT OF LEFT FOOT POSSIBLE APPLICATION OF INTEGRIA AND VAC ;  Surgeon: Irene Limbo, MD;  Location: [facility];  Service: Plastics;  Laterality: Left;  . PERIPHERAL VASCULAR CATHETERIZATION N/A 11/15/2014   Procedure: Abdominal Aortogram;  Surgeon: Conrad Frederick, MD;  Location: [facility];  Service: Cardiovascular;  Laterality: N/A;  . PERIPHERAL VASCULAR CATHETERIZATION  11/15/2014   Procedure: Lower Extremity Angiography;  Surgeon: Conrad Sylvarena, MD;  Location: [facility];  Service: Cardiovascular;;  . PERIPHERAL VASCULAR CATHETERIZATION Left 11/15/2014   Procedure: Peripheral Vascular Intervention;  Surgeon: Conrad [place], MD;  Location: [facility];  Service: Cardiovascular;  Laterality: Left;  popliteal artery stenting    OB History    Gravida Para Term Preterm AB Living   2 1 1  0 1 1   SAB TAB Ectopic Multiple Live Births   1   0 0         Home Medications    Prior to Admission medications   Medication Sig Start Date End Date Taking? Authorizing Provider  acetaminophen (TYLENOL) 500 MG tablet Take 1,000 mg by mouth every 6 (six) hours as needed for mild pain or moderate pain.   Yes [provider]  amoxicillin-clavulanate (AUGMENTIN) 875-125 MG tablet Take 1 tablet by mouth 2 (two) times daily. 06/15/16  Yes [provider]  aspirin EC 81 MG EC tablet Take 1 tablet (81 mg total) by mouth daily. 05/25/16  Yes Rai, Ripudeep K, MD  atenolol (TENORMIN) 25 MG tablet TK 1 T PO QD 05/02/16  Yes [provider]  budesonide (PULMICORT) 0.5 MG/2ML nebulizer solution Take 0.5 mg by nebulization 2 (two) times daily.   Yes [provider]  Cholecalciferol (VITAMIN D3) 5000 UNITS TABS Take 5,000 Units by mouth daily.    Yes [provider]  diphenoxylate-atropine (LOMOTIL) 2.5-0.025  MG tablet Take 1 tablet by mouth 4 (four) times daily as needed for diarrhea or loose stools. 06/12/16  Yes Campbell Riches, MD  Eszopiclone 3 MG TABS Take 1 tablet (3 mg total) by mouth at bedtime as needed. Take immediately before bedtime 06/12/16  Yes Campbell Riches, MD  fenofibrate (TRICOR) 145 MG tablet TK 1 T PO QD 05/11/16  Yes [provider]  furosemide (LASIX) 80 MG tablet TK 1 T PO QD 05/11/16  Yes [provider]  GENVOYA 150-150-200-10 MG TABS tablet TAKE 1 TABLET BY MOUTH EVERY DAY 12/19/15  Yes Campbell Riches, MD  glucose blood (ONETOUCH VERIO) test strip Use to test blood sugar 3 times daily 03/05/16  Yes Elayne Snare, MD  HUMULIN R U-500 KWIKPEN 500 UNIT/ML kwikpen Inject 35-60 Units into the skin. 60 units in the AM, 50 units at lunch, and 35 units at bedtime 05/11/16  Yes [provider]  HYDROcodone-acetaminophen (NORCO) 10-325 MG tablet TK  1 T PO  Q 8 H 04/11/16  Yes [provider]  Insulin Glargine (TOUJEO SOLOSTAR) 300 UNIT/ML SOPN Inject 50 Units into the skin 2 (two) times daily. Patient taking differently: Inject 70 Units into the skin 2 (two) times daily.  05/25/16  Yes Rai, Ripudeep K, MD  ipratropium-albuterol (DUONEB) 0.5-2.5 (3) MG/3ML SOLN Take 3 mLs by nebulization 3 (three) times daily. 05/25/16  Yes Rai, Ripudeep K, MD  LYRICA 225 MG capsule TK 1 C PO ONCE D 04/20/16  Yes [provider]  metFORMIN (GLUCOPHAGE-XR) 750 MG 24 hr tablet Take 750 mg by mouth daily with breakfast.  06/01/16  Yes [provider]  nystatin (MYCOSTATIN) 100000 UNIT/ML suspension Take 5 mLs (500,000 Units total) by mouth 4 (four) times daily. 05/25/16  Yes Rai, Ripudeep K, MD  pantoprazole (PROTONIX) 40 MG tablet Take 40 mg by mouth daily. 09/02/14  Yes [provider]  potassium chloride SA (K-DUR,KLOR-CON) 20 MEQ tablet Take 1 tablet (20 mEq total) by mouth daily. X 3 days, then check potassium levels if needed to continue 05/25/16   Yes Rai, Ripudeep K, MD  rosuvastatin (CRESTOR) 20 MG tablet Take 20 mg by mouth daily. Reported on 05/02/2015 09/02/14  Yes [provider]  SYMBICORT 80-4.5 MCG/ACT inhaler INL 2 PFS ITL BID 06/06/16  Yes [provider]  tiZANidine (ZANAFLEX) 4 MG tablet Take 4 mg by mouth 2 (two) times daily as needed for muscle spasms.  03/06/16  Yes [provider]  VICTOZA 18 MG/3ML SOPN ADM 1.8 MG [place] D 06/06/16  Yes [provider]  vitamin E (VITAMIN E) 1000 UNIT capsule Take 1,000 Units by mouth daily.    Yes [provider]  magnesium oxide (MAG-OX) 400 MG tablet Take 1 tablet (400 mg total) by mouth daily. Patient not taking: Reported on 06/18/2016 05/25/16   Mendel Corning, MD    Family History Family History  Problem Relation Age of Onset  . Diabetes Mother   . Hypertension Mother   . Vision loss Mother   . Heart disease Mother   . Hypertension Father   . Thyroid disease Neg Hx     Social History Social History  Substance Use Topics  . Smoking status: Current Every Day Smoker    Packs/day: 0.50    Years: 35.00    Types: E-cigarettes, Cigarettes  . Smokeless tobacco: Never Used  . Alcohol use No     Comment: no drink since 2008     Allergies   Cortizone-10 [hydrocortisone]   Review of Systems Review of Systems  All other systems reviewed and are negative.    Physical Exam Updated Vital Signs BP 122/70 (BP Location: Right Arm)   Pulse 98   Temp 97.6 F (36.4 C) (Oral)   Resp 20   Ht 1.778 m (5\' 10" )   Wt 107 kg (236 lb)   LMP 11/16/2010   SpO2 92%   BMI 33.86 kg/m   Physical Exam  Constitutional: She is oriented to person, place, and time. She appears well-developed and well-nourished.  Non-toxic appearance. No distress.  HENT:  Head: Normocephalic and atraumatic.  Eyes: Conjunctivae, EOM and lids are normal. Pupils are equal, round, and reactive to light.  Neck: Normal range of motion. Neck supple. No tracheal deviation  present. No thyroid mass present.  Cardiovascular: Normal rate, regular rhythm and normal heart sounds.  Exam reveals no gallop.   No murmur heard. Pulmonary/Chest: No stridor. Tachypnea noted. She has decreased  breath sounds in the right lower field and the left lower field. She has wheezes in the right lower field and the left lower field. She has no rhonchi. She has no rales.  Abdominal: Soft. Normal appearance and bowel sounds are normal. She exhibits no distension. There is no tenderness. There is no rebound and no CVA tenderness.  Musculoskeletal: Normal range of motion. She exhibits no edema or tenderness.  Neurological: She is alert and oriented to person, place, and time. She has normal strength. No cranial nerve deficit or sensory deficit. GCS eye subscore is 4. GCS verbal subscore is 5. GCS motor subscore is 6.  Skin: Skin is warm and dry. No abrasion and no rash noted.  Psychiatric: She has a normal mood and affect. Her speech is normal and behavior is normal.  Nursing note and vitals reviewed.    ED Treatments / Results  Labs (all labs ordered are listed, but only abnormal results are displayed) Labs Reviewed  CBC WITH DIFFERENTIAL/PLATELET - Abnormal; Notable for the following:       Result Value   WBC 11.3 (*)    Hemoglobin 11.9 (*)    RDW 21.6 (*)    Neutro Abs 8.2 (*)    All other components within normal limits  COMPREHENSIVE METABOLIC PANEL - Abnormal; Notable for the following:    Chloride 100 (*)    Glucose, Bld 335 (*)    BUN 27 (*)    Creatinine, Ser 1.44 (*)    Calcium 8.7 (*)    Albumin 2.9 (*)    GFR calc non Af Amer 41 (*)    GFR calc Af Amer 47 (*)    All other components within normal limits  TROPONIN I  BRAIN NATRIURETIC PEPTIDE    EKG  EKG Interpretation None       Radiology Dg Chest 2 View  Result Date: 06/18/2016 CLINICAL DATA:  Patient from home called EMS for shortness of breath. Wheezing. History of HIV. History of hypertension.  EXAM: CHEST  2 VIEW COMPARISON:  06/15/2016. FINDINGS: Low lung volumes. Cardiomegaly. Interstitial prominence is improved, not yet normal, consistent with vascular congestion. No consolidation. No effusion or pneumothorax. Old BILATERAL rib fractures. IMPRESSION: Cardiomegaly.  Improved interstitial edema, but not yet clear. Electronically Signed   By: Staci Righter M.D.   On: 06/18/2016 15:51    Procedures Procedures (including critical care time)  Medications Ordered in ED Medications  0.9 %  sodium chloride infusion (not administered)     Initial Impression / Assessment and Plan / ED Course  I have reviewed the triage vital signs and the nursing notes.  Pertinent labs & imaging results that were available during my care of the patient were reviewed by me and considered in my medical decision making (see chart for details).     Patient received treatment with site Medrol and magnesium prior to coming here.She was given albuterol as well. Patient's pulse oximetry was in the mid-80s and she is currently on 3 L of oxygen. Will be admitted by the hospitalist for further management  Final Clinical Impressions(s) / ED Diagnoses   Final diagnoses:  SOB (shortness of breath)    New Prescriptions New Prescriptions   No medications on file     Lacretia Leigh, MD 06/18/16 [place]    Lacretia Leigh, MD 06/18/16 1626

## 2016-06-18 NOTE — ED Notes (Signed)
Bed: WA09 Expected date:  Expected time:  Means of arrival:  Comments: EMS-SOB 

## 2016-06-18 NOTE — Progress Notes (Signed)
On call provider paged to clarify per pharmacist request that concentrated regular insulin should be given with the stat Novolog. Will c/t monitor

## 2016-06-18 NOTE — Progress Notes (Signed)
Pt continues to try to climb OOB, will not folllow directions and is difficult to redirect. Pt is very unsteady and unsafe as well as short of breath. Sats 96% on 2L. House sup on floor and suggests tele sitter due to no safety sitters available. Will page MD.

## 2016-06-18 NOTE — Progress Notes (Signed)
Patient is attempting to get OOB and very short of breath. Patient is very unsteady and unsafe and not following safety direction. Previously attempted to get patient to [facility] and pt was very unsteady and short ob breath so RN advised the most safe thing to do is stay in bed and use another option. Patient given bedpan and female external cath but is still climbing OOB. Pt reminded of safety concerns and reasons for staying in bed and calling for assistance. Will continue to monitor and redirect.

## 2016-06-18 NOTE — Progress Notes (Signed)
Contacted pharmacy to clarify the insulin order for the concentrated insulin because it is not available on floor. Pharmacist states they do not have the particular type but can usually substitute with regular insulin. States he will clarify and get back to RN. Will c/t monitor.

## 2016-06-18 NOTE — Progress Notes (Signed)
Blood glucose drawn stat, resulted at >600. Provider on call paged. Will c/t monitor.

## 2016-06-18 NOTE — H&P (Signed)
History and Physical    [name] GDJ:[id number] DOB: [birth year]  DOA: 06/18/2016 PCP: Michel Harrow, PA-C  Patient coming from: Home   Chief Complaint: SOB   HPI: [name] is a 51 y.o. female with medical history significant of HIV, hypertension, type 2 diabetes, obesity, asthma/COPD and tobacco abuse presented to the emergency department complaining of worsening of shortness of breath for the past week. Associated symptoms include cough and wheezing. She had trials of nebulizers at home which did not help. The past today her shortness of breath has become worse and she decided today to call the ambulance. Upon arrival she was satting at mid 15s she was placed on oxygen and given albuterol treatment. Patient denies nasal congestion, chest pain, palpitation, recent sick contacts and dizziness.  ED Course: Patient received solumedrol x 1, mag and nebulizer treatment with no significant improvement, she continues to desat to mid 80 w/o o2 supplementation   Review of Systems:   General: no changes in body weight, no fever chills or decrease in energy.  HEENT: no blurry vision, hearing changes or sore throat Respiratory:  see history of present illness  CV: no chest pain, no palpitations GI: no nausea, vomiting, abdominal pain, diarrhea, constipation GU: no dysuria, burning on urination, increased urinary frequency, hematuria  Ext:. BKA  Neuro: no unilateral weakness, numbness, or tingling, no vision change or hearing loss Skin: No rashes, lesions or wounds. MSK: No muscle spasm, no deformity,  Heme: positive for easy bruising.  Travel history: No recent long distant travel.   Past Medical History:  Diagnosis Date  . Anxiety   . Arthritis    LEFT HIP  . Asthma    HOSPITALIZED WITH EXCERBATION OF ASTHMA - AND BRONCHITIS AND THE FLU DEC 2013  . Depression   . Diabetes mellitus    ON INSULIN AND ORAL MEDICATIONS  . GERD (gastroesophageal reflux disease)   .  HIV infection ([place]) 2000  . Hypertension   . Neuropathy    NEUROPATHY HANDS AND FEET  . Pain    SEVERE PAIN LEFT HIP - HX OF LEFT HIP PINNING JULY 2013  . Shortness of breath    ALLERGIES ARE "ACTING UP"    Past Surgical History:  Procedure Laterality Date  . AMPUTATION Left 11/18/2014   Procedure: LEFT BELOW KNEE AMPUTATION;  Surgeon: Leandrew Koyanagi, MD;  Location: [facility];  Service: Orthopedics;  Laterality: Left;  . CAST APPLICATION Left 06/03/2295   Procedure: CAST APPLICATION (FIBERGLASS);  Surgeon: Latanya Maudlin, MD;  Location: [facility];  Service: Orthopedics;  Laterality: Left;  CLOSED REDUCTION OF LEFT COLLES FRACTURE WITH SHORT ARM CAST  . HARDWARE REMOVAL Left 05/05/2014   Procedure: HARDWARE REMOVAL LEFT HIP;  Surgeon: Latanya Maudlin, MD;  Location: [facility];  Service: Orthopedics;  Laterality: Left;  REMOVAL BIOMET 6.5-8.0 CANNULATED SCREW  . HIP ARTHROPLASTY Left 05/05/2014   Procedure: ARTHROPLASTY OPEN REDUCTION INTERNAL FIXATION LEFT HIP AND REMOVAL OF TWO CANNULATED SCREW;  Surgeon: Latanya Maudlin, MD;  Location: [facility];  Service: Orthopedics;  Laterality: Left;  . HIP PINNING,CANNULATED  07/16/2011   Procedure: CANNULATED HIP PINNING;  Surgeon: Gearlean Alf, MD;  Location: [facility];  Service: Orthopedics;  Laterality: Left;  . HIP PINNING,CANNULATED Left 04/09/2012   Procedure: CANNULATED HIP PINNING AND HARDWARE REVISION;  Surgeon: Gearlean Alf, MD;  Location: [facility];  Service: Orthopedics;  Laterality: Left;  . I&D EXTREMITY Left 11/16/2014   Procedure:  DEBRIDEMENT OF LEFT FOOT  POSSIBLE APPLICATION OF INTEGRIA AND VAC ;  Surgeon: Irene Limbo, MD;  Location: [facility];  Service: Plastics;  Laterality: Left;  . PERIPHERAL VASCULAR CATHETERIZATION N/A 11/15/2014   Procedure: Abdominal Aortogram;  Surgeon: Conrad Muddy, MD;  Location: [facility];  Service: Cardiovascular;  Laterality: N/A;  . PERIPHERAL VASCULAR CATHETERIZATION  11/15/2014   Procedure: Lower Extremity  Angiography;  Surgeon: Conrad [place], MD;  Location: [facility];  Service: Cardiovascular;;  . PERIPHERAL VASCULAR CATHETERIZATION Left 11/15/2014   Procedure: Peripheral Vascular Intervention;  Surgeon: Conrad [place], MD;  Location: [facility];  Service: Cardiovascular;  Laterality: Left;  popliteal artery stenting     reports that she has been smoking E-cigarettes and Cigarettes.  She has a 17.50 pack-year smoking history. She has never used smokeless tobacco. She reports that she does not drink alcohol or use drugs.  Allergies  Allergen Reactions  . Cortizone-10 [Hydrocortisone] Rash    Per patient, given local injection at knee and developed rash at local site.     Family History  Problem Relation Age of Onset  . Diabetes Mother   . Hypertension Mother   . Vision loss Mother   . Heart disease Mother   . Hypertension Father   . Thyroid disease Neg Hx    Family history reviewed and non-pertinent  Prior to Admission medications   Medication Sig Start Date End Date Taking? Authorizing Provider  acetaminophen (TYLENOL) 500 MG tablet Take 1,000 mg by mouth every 6 (six) hours as needed for mild pain or moderate pain.   Yes [provider]  amoxicillin-clavulanate (AUGMENTIN) 875-125 MG tablet Take 1 tablet by mouth 2 (two) times daily. 06/15/16  Yes [provider]  aspirin EC 81 MG EC tablet Take 1 tablet (81 mg total) by mouth daily. 05/25/16  Yes Rai, Ripudeep K, MD  atenolol (TENORMIN) 25 MG tablet TK 1 T PO QD 05/02/16  Yes [provider]  budesonide (PULMICORT) 0.5 MG/2ML nebulizer solution Take 0.5 mg by nebulization 2 (two) times daily.   Yes [provider]  Cholecalciferol (VITAMIN D3) 5000 UNITS TABS Take 5,000 Units by mouth daily.    Yes [provider]  diphenoxylate-atropine (LOMOTIL) 2.5-0.025 MG tablet Take 1 tablet by mouth 4 (four) times daily as needed for diarrhea or loose stools. 06/12/16  Yes Campbell Riches, MD  Eszopiclone 3 MG TABS Take 1 tablet (3 mg total) by mouth at bedtime as needed. Take immediately before bedtime 06/12/16  Yes Campbell Riches, MD  fenofibrate (TRICOR) 145 MG tablet TK 1 T PO QD 05/11/16  Yes [provider]  furosemide (LASIX) 80 MG tablet TK 1 T PO QD 05/11/16  Yes [provider]  GENVOYA 150-150-200-10 MG TABS tablet TAKE 1 TABLET BY MOUTH EVERY DAY 12/19/15  Yes Campbell Riches, MD  glucose blood (ONETOUCH VERIO) test strip Use to test blood sugar 3 times daily 03/05/16  Yes Elayne Snare, MD  HUMULIN R U-500 KWIKPEN 500 UNIT/ML kwikpen Inject 35-60 Units into the skin. 60 units in the AM, 50 units at lunch, and 35 units at bedtime 05/11/16  Yes [provider]  HYDROcodone-acetaminophen (NORCO) 10-325 MG tablet TK 1 T PO  Q 8 H 04/11/16  Yes [provider]  Insulin Glargine (TOUJEO SOLOSTAR) 300 UNIT/ML SOPN Inject 50 Units into the skin 2 (two) times daily. Patient taking differently: Inject 70 Units into the skin 2 (two) times daily.  05/25/16  Yes Rai, Ripudeep K, MD  ipratropium-albuterol (DUONEB) 0.5-2.5 (3) MG/3ML SOLN Take 3 mLs by nebulization 3 (three) times daily. 05/25/16  Yes Rai, Ripudeep K, MD  LYRICA 225 MG capsule TK 1 C PO ONCE D 04/20/16  Yes [provider]  metFORMIN (GLUCOPHAGE-XR) 750 MG 24 hr tablet Take 750 mg by mouth daily with breakfast.  06/01/16  Yes [provider]  nystatin (MYCOSTATIN) 100000 UNIT/ML suspension Take 5 mLs (500,000 Units total) by mouth 4 (four) times daily. 05/25/16  Yes Rai, Ripudeep K, MD  pantoprazole (PROTONIX) 40 MG tablet Take 40 mg by mouth daily. 09/02/14  Yes [provider]  potassium chloride SA (K-DUR,KLOR-CON) 20 MEQ tablet Take 1 tablet (20 mEq total) by mouth daily. X 3 days, then check potassium levels if needed to continue 05/25/16  Yes Rai, Ripudeep K, MD  rosuvastatin (CRESTOR) 20 MG tablet Take 20 mg by mouth daily. Reported on 05/02/2015 09/02/14  Yes  [provider]  SYMBICORT 80-4.5 MCG/ACT inhaler INL 2 PFS ITL BID 06/06/16  Yes [provider]  tiZANidine (ZANAFLEX) 4 MG tablet Take 4 mg by mouth 2 (two) times daily as needed for muscle spasms.  03/06/16  Yes [provider]  VICTOZA 18 MG/3ML SOPN ADM 1.8 MG [place] D 06/06/16  Yes [provider]  vitamin E (VITAMIN E) 1000 UNIT capsule Take 1,000 Units by mouth daily.    Yes [provider]  magnesium oxide (MAG-OX) 400 MG tablet Take 1 tablet (400 mg total) by mouth daily. Patient not taking: Reported on 06/18/2016 05/25/16   Mendel Corning, MD    Physical Exam: Vitals:   06/18/16 1314 06/18/16 1357  BP: (!) 146/89 122/70  Pulse: 100 98  Resp:  20  Temp:  97.6 F (36.4 C)  TempSrc:  Oral  SpO2: (!) 89% 92%  Weight: 107 kg (236 lb)   Height: 5\' 10"  (1.778 m)      Constitutional: Mild distress, patient receiving nebulizer treatment Eyes: PERRL, lids and conjunctivae normal ENMT: Mucous membranes are moist. Posterior pharynx clear of any exudate or lesions. Neck: normal, supple, no masses, no thyromegaly Respiratory: Tachypneic, decreased air intake bibasilar, mild expiratory wheezing and left lower lobe crackles.  Cardiovascular: S1-S2, tachycardia, no murmurs / rubs / gallops. No extremity edema. 2+ pedal pulses. Abdomen: Obese abdomen soft, no tenderness, no masses palpated. No hepatosplenomegaly. Bowel sounds positive.  Musculoskeletal: no clubbing / cyanosis. Left BKA  Skin: Multiple ecchymosis in the arms Neurologic: CN 2-12 grossly intact. Sensation intact, Psychiatric: Normal judgment and insight. Alert and oriented x 3. Mood depressed  Labs on Admission: I have personally reviewed following labs and imaging studies  CBC:  Recent Labs Lab 06/12/16 1638 06/18/16 1356  WBC 20.5* 11.3*  NEUTROABS 17,425* 8.2*  HGB 12.5 11.9*  HCT 40.2 36.6  MCV 85.9 83.6  PLT 292 PLATELET CLUMPS NOTED ON SMEAR, UNABLE TO ESTIMATE   Basic  Metabolic Panel:  Recent Labs Lab 06/12/16 1638 06/18/16 1426  NA 136 137  K 4.8 4.3  CL 99 100*  CO2 26 25  GLUCOSE 213* 335*  BUN 20 27*  CREATININE 1.61* 1.44*  CALCIUM 9.1 8.7*   GFR: Estimated Creatinine Clearance: 60.5 mL/min (A) (by C-G formula based on SCr of 1.44 mg/dL (H)). Liver Function Tests:  Recent Labs Lab 06/12/16 1638 06/18/16 1426  AST 9* 23  ALT 8 19  ALKPHOS 60 93  BILITOT 0.3 1.0  PROT 6.1 7.1  ALBUMIN 3.5* 2.9*   No results for input(s): LIPASE, AMYLASE in the last 168 hours. No results for input(s): AMMONIA in the last 168 hours. Coagulation Profile: No results for input(s): INR, PROTIME in the last 168 hours. Cardiac Enzymes: No results for input(s): CKTOTAL, CKMB, CKMBINDEX, TROPONINI in the last 168 hours. BNP (last 3 results) No results for input(s): PROBNP in the last 8760 hours. HbA1C: No results for input(s): HGBA1C in the last 72 hours. CBG: No results for input(s): GLUCAP in the last 168 hours. Lipid Profile: No results for input(s): CHOL, HDL, LDLCALC, TRIG, CHOLHDL, LDLDIRECT in the last 72 hours. Thyroid Function Tests: No results for input(s): TSH, T4TOTAL, FREET4, T3FREE, THYROIDAB in the last 72 hours. Anemia Panel: No results for input(s): VITAMINB12, FOLATE, FERRITIN, TIBC, IRON, RETICCTPCT in the last 72 hours. Urine analysis:    Component Value Date/Time   COLORURINE YELLOW 05/19/2016 2232   APPEARANCEUR CLEAR 05/19/2016 2232   LABSPEC 1.022 05/19/2016 2232   PHURINE 5.0 05/19/2016 2232   GLUCOSEU >=500 (A) 05/19/2016 2232   GLUCOSEU 250 (A) 03/16/2015 1612   HGBUR SMALL (A) 05/19/2016 2232   BILIRUBINUR NEGATIVE 05/19/2016 2232   KETONESUR 5 (A) 05/19/2016 2232   PROTEINUR 30 (A) 05/19/2016 2232   UROBILINOGEN 0.2 03/16/2015 1612   NITRITE NEGATIVE 05/19/2016 2232   LEUKOCYTESUR NEGATIVE 05/19/2016 2232   Sepsis Labs: !!!!!!!!!!!!!!!!!!!!!!!!!!!!!!!!!!!!!!!!!!!! @LABRCNTIP (procalcitonin:4,lacticidven:4) )No  results found for this or any previous visit (from the past 240 hour(s)).   Radiological Exams on Admission: Dg Chest 2 View  Result Date: 06/18/2016 CLINICAL DATA:  Patient from home called EMS for shortness of breath. Wheezing. History of HIV. History of hypertension. EXAM: CHEST  2 VIEW COMPARISON:  06/15/2016. FINDINGS: Low lung volumes. Cardiomegaly. Interstitial prominence is improved, not yet normal, consistent with vascular congestion. No consolidation. No effusion or pneumothorax. Old BILATERAL rib fractures. IMPRESSION: Cardiomegaly.  Improved interstitial edema, but not yet clear. Electronically Signed   By: Staci Righter M.D.   On: 06/18/2016 15:51   Assessment/Plan 51 year old female with multiple medical comorbidities who is admitted for short of breath secondary to asthma/COPD exacerbation, and concern of fluid overload.  Acute respiratory failure with hypoxia - likely multifactorial asthma/COPD also concern for fluid overload as patient have chronic diastolic dysfunction. On exam there is mix of wheezing and crackles chest x-ray concerning for interstitial edema with a BNP of 592. Possible OHS/OSA Admit to MedSurg Solu-Medrol 60 twice a day Nebulizer treatment with albuterol every 4 hrs scheduled and every 2 hrs when necessary also Brovana and Pulmicort BID.  O2 supplementation as needed keep saturation above 91% 1 dose of Lasix IV 80 mg given will continue her by mouth Lasix.  Continue supportive treatment Patient placed on observation at the time of my examination she was on 1 L nasal cannula and saturating above 91% she continues to improve may be discharged in the morning  Asthma/COPD See above  HIV/AIDs Last CD4 on 06/12/2016 was 430 Viral load nondetectable Continue Genvoya and follow up with ID as an outpatient  Diabetes mellitus type 2, uncontrolled - hyperglycemia on admission likely secondary to steroids Monitor closely blood glucose CBGs every 4 hours given  patient on steroids. Continue home regimen which includes Tujeo 70 units BID, RU 500 50 unit in am, 35 units at noon and 60 units in PM plus Victoza, Metformin placed on hold  Last A1C 9.8 in 05/20/2016 SSI added as well   Chronic CKD stage III Creatinine trending stable, actually trending  down from 06/12/2016 Monitor BMP in the morning  Essential hypertension Blood pressure stable We'll resume home medications - atenolol and Lasix  Chronic diastolic dysfunction grade 2 Chest x-ray concerning for pulmonary congestion and BNP elevated 1 dose of IV exit was given will continue oral Lasix 80 mg daily. Check I&O's  Low salt diet  Daily weight   DVT prophylaxis: Heparin subcutaneous Code Status: Full code Family Communication: None at bedside  Disposition Plan: Anticipate discharge to previous home environment.  Consults called: None Admission status: Medical floor/observation   Chipper Oman MD [facility] Pager: Text Page via [email or web address]  [phone]  If 7PM-7AM, please contact night-coverage [email or web address] Password [email or web address]  06/18/2016, 4:30 PM

## 2016-06-18 NOTE — ED Notes (Signed)
Report given to floor rn transport took patient to room

## 2016-06-18 NOTE — Progress Notes (Signed)
Pharmacist called back, the pharmacy does have the concentrated insulin in stock. States to pass special instructions to all staff regarding medication: Medication is a direct dial to what is given so dial to dose ordered and give as is. Keep needles in bag and insulin within bag so it all stays together and extra needles available if needed, does not need to be refrigerated and pay special attention to exact dosing because large amounts can be given if not careful. Will write instructions up and pass along with shift change. Also, pharmacist advised to verify with on call provider that regular insulin should be given along with the stat order of Novolog that was just ordered. Will contact provider to clarify.

## 2016-06-18 NOTE — ED Triage Notes (Signed)
Patient from home called EMS for SOB. Patient has global wheezing no pain. Patient has received 10 mg of albuterol and 0.5 of Atrovent and 125 mg of solumedrol and 2 g of mag. Patient has a 20G left hand IV. No improvement with resp function.

## 2016-06-18 NOTE — Progress Notes (Signed)
Pt CBG is reading HI. Rechecked two additional times with same result. Provider on call paged.

## 2016-06-18 NOTE — Progress Notes (Signed)
Pt up in room, has pulled IV out and is walking to bathroom with stool on floor and bed. Patient is assisted back to bed and cleaned up. Advised patient to please call for assistance and discussed safety concerns. Patient verbalized understanding. Bed alarm placed. Call bell explained to patient and placed in patient lap. Will c/t monitor.

## 2016-06-19 DIAGNOSIS — R0602 Shortness of breath: Secondary | ICD-10-CM | POA: Diagnosis not present

## 2016-06-19 DIAGNOSIS — F17290 Nicotine dependence, other tobacco product, uncomplicated: Secondary | ICD-10-CM | POA: Diagnosis present

## 2016-06-19 DIAGNOSIS — I5033 Acute on chronic diastolic (congestive) heart failure: Secondary | ICD-10-CM | POA: Diagnosis present

## 2016-06-19 DIAGNOSIS — J9621 Acute and chronic respiratory failure with hypoxia: Secondary | ICD-10-CM | POA: Diagnosis present

## 2016-06-19 DIAGNOSIS — I5031 Acute diastolic (congestive) heart failure: Secondary | ICD-10-CM | POA: Diagnosis not present

## 2016-06-19 DIAGNOSIS — Z794 Long term (current) use of insulin: Secondary | ICD-10-CM

## 2016-06-19 DIAGNOSIS — E1122 Type 2 diabetes mellitus with diabetic chronic kidney disease: Secondary | ICD-10-CM | POA: Diagnosis present

## 2016-06-19 DIAGNOSIS — E1142 Type 2 diabetes mellitus with diabetic polyneuropathy: Secondary | ICD-10-CM | POA: Diagnosis present

## 2016-06-19 DIAGNOSIS — B20 Human immunodeficiency virus [HIV] disease: Secondary | ICD-10-CM | POA: Diagnosis present

## 2016-06-19 DIAGNOSIS — N179 Acute kidney failure, unspecified: Secondary | ICD-10-CM | POA: Diagnosis present

## 2016-06-19 DIAGNOSIS — J441 Chronic obstructive pulmonary disease with (acute) exacerbation: Secondary | ICD-10-CM | POA: Diagnosis present

## 2016-06-19 DIAGNOSIS — R739 Hyperglycemia, unspecified: Secondary | ICD-10-CM | POA: Diagnosis not present

## 2016-06-19 DIAGNOSIS — E118 Type 2 diabetes mellitus with unspecified complications: Secondary | ICD-10-CM | POA: Diagnosis not present

## 2016-06-19 DIAGNOSIS — K219 Gastro-esophageal reflux disease without esophagitis: Secondary | ICD-10-CM | POA: Diagnosis present

## 2016-06-19 DIAGNOSIS — F17210 Nicotine dependence, cigarettes, uncomplicated: Secondary | ICD-10-CM | POA: Diagnosis present

## 2016-06-19 DIAGNOSIS — E669 Obesity, unspecified: Secondary | ICD-10-CM | POA: Diagnosis present

## 2016-06-19 DIAGNOSIS — F419 Anxiety disorder, unspecified: Secondary | ICD-10-CM | POA: Diagnosis present

## 2016-06-19 DIAGNOSIS — J40 Bronchitis, not specified as acute or chronic: Secondary | ICD-10-CM | POA: Diagnosis present

## 2016-06-19 DIAGNOSIS — Z89511 Acquired absence of right leg below knee: Secondary | ICD-10-CM | POA: Diagnosis not present

## 2016-06-19 DIAGNOSIS — Z6833 Body mass index (BMI) 33.0-33.9, adult: Secondary | ICD-10-CM | POA: Diagnosis not present

## 2016-06-19 DIAGNOSIS — E1165 Type 2 diabetes mellitus with hyperglycemia: Secondary | ICD-10-CM | POA: Diagnosis present

## 2016-06-19 DIAGNOSIS — J9601 Acute respiratory failure with hypoxia: Secondary | ICD-10-CM | POA: Diagnosis not present

## 2016-06-19 DIAGNOSIS — G629 Polyneuropathy, unspecified: Secondary | ICD-10-CM | POA: Diagnosis present

## 2016-06-19 DIAGNOSIS — Z89512 Acquired absence of left leg below knee: Secondary | ICD-10-CM | POA: Diagnosis not present

## 2016-06-19 DIAGNOSIS — I130 Hypertensive heart and chronic kidney disease with heart failure and stage 1 through stage 4 chronic kidney disease, or unspecified chronic kidney disease: Secondary | ICD-10-CM | POA: Diagnosis present

## 2016-06-19 DIAGNOSIS — J45901 Unspecified asthma with (acute) exacerbation: Secondary | ICD-10-CM | POA: Diagnosis present

## 2016-06-19 DIAGNOSIS — T380X5A Adverse effect of glucocorticoids and synthetic analogues, initial encounter: Secondary | ICD-10-CM | POA: Diagnosis present

## 2016-06-19 DIAGNOSIS — J4541 Moderate persistent asthma with (acute) exacerbation: Secondary | ICD-10-CM | POA: Diagnosis not present

## 2016-06-19 DIAGNOSIS — E119 Type 2 diabetes mellitus without complications: Secondary | ICD-10-CM | POA: Diagnosis not present

## 2016-06-19 DIAGNOSIS — F329 Major depressive disorder, single episode, unspecified: Secondary | ICD-10-CM | POA: Diagnosis present

## 2016-06-19 DIAGNOSIS — Z96642 Presence of left artificial hip joint: Secondary | ICD-10-CM | POA: Diagnosis present

## 2016-06-19 DIAGNOSIS — N183 Chronic kidney disease, stage 3 (moderate): Secondary | ICD-10-CM | POA: Diagnosis present

## 2016-06-19 LAB — BASIC METABOLIC PANEL
ANION GAP: 12 (ref 5–15)
Anion gap: 15 (ref 5–15)
Anion gap: 10 (ref 5–15)
BUN: 39 mg/dL — AB (ref 6–20)
BUN: 39 mg/dL — AB (ref 6–20)
BUN: 37 mg/dL — ABNORMAL HIGH (ref 6–20)
CALCIUM: 9.3 mg/dL (ref 8.9–10.3)
CHLORIDE: 91 mmol/L — AB (ref 101–111)
CHLORIDE: 94 mmol/L — AB (ref 101–111)
CO2: 24 mmol/L (ref 22–32)
CO2: 29 mmol/L (ref 22–32)
CO2: 32 mmol/L (ref 22–32)
CREATININE: 1.7 mg/dL — AB (ref 0.44–1.00)
Calcium: 8.9 mg/dL (ref 8.9–10.3)
Calcium: 9.4 mg/dL (ref 8.9–10.3)
Chloride: 96 mmol/L — ABNORMAL LOW (ref 101–111)
Creatinine, Ser: 1.44 mg/dL — ABNORMAL HIGH (ref 0.44–1.00)
Creatinine, Ser: 1.4 mg/dL — ABNORMAL HIGH (ref 0.44–1.00)
GFR calc Af Amer: 47 mL/min — ABNORMAL LOW (ref >60)
GFR calc non Af Amer: 41 mL/min — ABNORMAL LOW (ref >60)
GFR calc non Af Amer: 42 mL/min — ABNORMAL LOW (ref >60)
GFR, EST AFRICAN AMERICAN: 39 mL/min — AB (ref >60)
GFR, EST AFRICAN AMERICAN: 49 mL/min — AB (ref >60)
GFR, EST NON AFRICAN AMERICAN: 33 mL/min — AB (ref >60)
GLUCOSE: 280 mg/dL — AB (ref 65–99)
Glucose, Bld: 723 mg/dL (ref 65–99)
Glucose, Bld: 353 mg/dL — ABNORMAL HIGH (ref 65–99)
POTASSIUM: 3.2 mmol/L — AB (ref 3.5–5.1)
POTASSIUM: 4 mmol/L (ref 3.5–5.1)
Potassium: 3.9 mmol/L (ref 3.5–5.1)
SODIUM: 130 mmol/L — AB (ref 135–145)
SODIUM: 137 mmol/L (ref 135–145)
SODIUM: 136 mmol/L (ref 135–145)

## 2016-06-19 LAB — GLUCOSE, CAPILLARY
GLUCOSE-CAPILLARY: 582 mg/dL — AB (ref 65–99)
GLUCOSE-CAPILLARY: 567 mg/dL — AB (ref 65–99)
GLUCOSE-CAPILLARY: 476 mg/dL — AB (ref 65–99)
GLUCOSE-CAPILLARY: 295 mg/dL — AB (ref 65–99)
GLUCOSE-CAPILLARY: 464 mg/dL — AB (ref 65–99)
GLUCOSE-CAPILLARY: 569 mg/dL — AB (ref 65–99)
GLUCOSE-CAPILLARY: 384 mg/dL — AB (ref 65–99)
GLUCOSE-CAPILLARY: 343 mg/dL — AB (ref 65–99)
Glucose-Capillary: >600 mg/dL (ref 65–99)
Glucose-Capillary: 409 mg/dL — ABNORMAL HIGH (ref 65–99)
Glucose-Capillary: 336 mg/dL — ABNORMAL HIGH (ref 65–99)
Glucose-Capillary: 235 mg/dL — ABNORMAL HIGH (ref 65–99)
Glucose-Capillary: 208 mg/dL — ABNORMAL HIGH (ref 65–99)
Glucose-Capillary: 291 mg/dL — ABNORMAL HIGH (ref 65–99)

## 2016-06-19 LAB — URINALYSIS, ROUTINE W REFLEX MICROSCOPIC
Bacteria, UA: NONE SEEN
Bilirubin Urine: NEGATIVE
Glucose, UA: >=500 mg/dL — AB
Hgb urine dipstick: NEGATIVE
Ketones, ur: NEGATIVE mg/dL
Leukocytes, UA: NEGATIVE
NITRITE: NEGATIVE
PH: 5 (ref 5.0–8.0)
Protein, ur: NEGATIVE mg/dL
RBC / HPF: NONE SEEN RBC/hpf (ref 0–5)
SPECIFIC GRAVITY, URINE: 1.009 (ref 1.005–1.030)
Squamous Epithelial / LPF: NONE SEEN

## 2016-06-19 LAB — CBC
HEMATOCRIT: 36.7 % (ref 36.0–46.0)
HEMOGLOBIN: 11.8 g/dL — AB (ref 12.0–15.0)
MCH: 26.8 pg (ref 26.0–34.0)
MCHC: 32.2 g/dL (ref 30.0–36.0)
MCV: 83.2 fL (ref 78.0–100.0)
Platelets: 303 10*3/uL (ref 150–400)
RBC: 4.41 MIL/uL (ref 3.87–5.11)
RDW: 20.8 % — AB (ref 11.5–15.5)
WBC: 7.8 10*3/uL (ref 4.0–10.5)

## 2016-06-19 LAB — MRSA PCR SCREENING: MRSA BY PCR: NEGATIVE

## 2016-06-19 MED ORDER — INSULIN ASPART 100 UNIT/ML ~~LOC~~ SOLN
0.0000 [IU] | Freq: Three times a day (TID) | SUBCUTANEOUS | Status: DC
  Administered 2016-06-19: 7 [IU] via SUBCUTANEOUS
  Administered 2016-06-20: 15 [IU] via SUBCUTANEOUS
  Administered 2016-06-20: 20 [IU] via SUBCUTANEOUS

## 2016-06-19 MED ORDER — INSULIN ASPART 100 UNIT/ML ~~LOC~~ SOLN: 0.0000 [IU] | Freq: Every day | SUBCUTANEOUS | Status: DC

## 2016-06-19 MED ORDER — SODIUM CHLORIDE 0.9 % IV SOLN
INTRAVENOUS | Status: DC
  Administered 2016-06-19: 5.2 [IU]/h via INTRAVENOUS
  Filled 2016-06-19 (×3): qty 1

## 2016-06-19 MED ORDER — SODIUM CHLORIDE 0.9 % IV SOLN: INTRAVENOUS | Status: DC

## 2016-06-19 MED ORDER — IPRATROPIUM-ALBUTEROL 0.5-2.5 (3) MG/3ML IN SOLN
3.0000 mL | Freq: Four times a day (QID) | RESPIRATORY_TRACT | Status: DC
  Administered 2016-06-20 (×3): 3 mL via RESPIRATORY_TRACT
  Filled 2016-06-19 (×3): qty 3

## 2016-06-19 MED ORDER — PREMIER PROTEIN SHAKE
11.0000 [oz_av] | Freq: Two times a day (BID) | ORAL | Status: DC
  Administered 2016-06-20: 11 [oz_av] via ORAL
  Filled 2016-06-19 (×3): qty 325.31

## 2016-06-19 MED ORDER — DEXTROSE-NACL 5-0.45 % IV SOLN: INTRAVENOUS | Status: DC

## 2016-06-19 NOTE — H&P (Addendum)
PROGRESS NOTE  [name]:[id number] DOB: 07-Oct-1965 DOA: 06/18/2016 PCP: Michel Harrow, PA-C  HPI/Recap of past 24 hours:  Feeling better, on 2liter oxygen,   Assessment/Plan: Active Problems:   Asthma exacerbation  Acute on chronic respiratory failure with hypoxia (she report she was started on home 02 at 2liter recently): --she initially required NRB due to significant hypoxia -hypoxia Likely multifactorial including copd/asthma exacerbation, acute on chronic diastolic chf, cxr on admission showed cardiomegaly and interstitial edema --She received iv steroids, iv lasix , oral abx, nebs, she is improved, now on iv steroids, oral lasix, oral abx -taper steroids, wean o2, ambulate  Hyperglycemia: from steroids and uncontrolled dm2, recent a1c 9.8 -not in DKA -blood sugar went up to 800, required insulin drip overnight and stepdown transfer, he blood sugar again elevated briefly after turning off insulin drip, this am,  insulin drip restarted,  -expect come off insulin drip, once taper off steroids. -home diabetes meds held, expect resume home meds at discharge.  AKI on CKD II/III: --ua no infection --Cr was normal at 0.97 in 05/2016 --cr 1.7 on admission, today 1.4, continue monitor cr, renal dosing meds  HTN; continue home meds atenolol/lasix  HIV/AIDS: Last CD4 on 06/12/2016 was 430 Viral load nondetectable Continue Genvoya and follow up with ID as an outpatient  Body mass index is 33.86 kg/m.  FTT; patient report she was just discharged from SNF three days ago. Will need PT eval.  Smoking cessation education, she declined nicotine patch.  Code Status: full  Family Communication: patient   Disposition Plan: pending PT eval   Consultants:  none  Procedures:  none  Antibiotics:  augmentin   Objective: BP (!) 127/57   Pulse 91   Temp 97.5 F (36.4 C) (Oral)   Resp 14   Ht 5\' 10"  (1.778 m)   Wt 107 kg (236 lb)   LMP 11/16/2010    SpO2 (!) 88%   BMI 33.86 kg/m   Intake/Output Summary (Last 24 hours) at 06/19/16 0912 Last data filed at 06/19/16 0330  Gross per 24 hour  Intake              340 ml  Output             2200 ml  Net            -1860 ml   Filed Weights   06/18/16 1314  Weight: 107 kg (236 lb)    Exam:   General:  Obese, NAD  Cardiovascular: RRR  Respiratory: diminished at basis, some bibasilar crackles, wheezing has resolved  Abdomen: Soft/ND/NT, positive BS  Musculoskeletal: s/p right kba, No Edema  Neuro: aaox3  Data Reviewed: Basic Metabolic Panel:  Recent Labs Lab 06/12/16 1638 06/18/16 1426 06/18/16 2230 06/19/16 0135  NA 136 137  --  130*  K 4.8 4.3  --  3.9  CL 99 100*  --  91*  CO2 26 25  --  24  GLUCOSE 213* 335* 814* 723*  BUN 20 27*  --  39*  CREATININE 1.61* 1.44*  --  1.70*  CALCIUM 9.1 8.7*  --  8.9   Liver Function Tests:  Recent Labs Lab 06/12/16 1638 06/18/16 1426  AST 9* 23  ALT 8 19  ALKPHOS 60 93  BILITOT 0.3 1.0  PROT 6.1 7.1  ALBUMIN 3.5* 2.9*   No results for input(s): LIPASE, AMYLASE in the last 168 hours. No results for input(s): AMMONIA in the last 168 hours.  CBC:  Recent Labs Lab 06/12/16 1638 06/18/16 1356 06/19/16 0135  WBC 20.5* 11.3* 7.8  NEUTROABS 17,425* 8.2*  --   HGB 12.5 11.9* 11.8*  HCT 40.2 36.6 36.7  MCV 85.9 83.6 83.2  PLT 292 PLATELET CLUMPS NOTED ON SMEAR, UNABLE TO ESTIMATE 303   Cardiac Enzymes:    Recent Labs Lab 06/18/16 1636  TROPONINI <0.03   BNP (last 3 results)  Recent Labs  05/19/16 2217 05/23/16 1205 06/18/16 1636  BNP 520.0* 119.1* 592.7*    ProBNP (last 3 results) No results for input(s): PROBNP in the last 8760 hours.  CBG:  Recent Labs Lab 06/19/16 0406 06/19/16 0515 06/19/16 0617 06/19/16 0720 06/19/16 0827  GLUCAP 582* 567* 476* 409* 336*    Recent Results (from the past 240 hour(s))  MRSA PCR Screening     Status: None   Collection Time: 06/19/16  4:38 AM  Result  Value Ref Range Status   MRSA by PCR NEGATIVE NEGATIVE Final    Comment:        The GeneXpert MRSA Assay (FDA approved for NASAL specimens only), is one component of a comprehensive MRSA colonization surveillance program. It is not intended to diagnose MRSA infection nor to guide or monitor treatment for MRSA infections.      Studies: Dg Chest 2 View  Result Date: 06/18/2016 CLINICAL DATA:  Patient from home called EMS for shortness of breath. Wheezing. History of HIV. History of hypertension. EXAM: CHEST  2 VIEW COMPARISON:  06/15/2016. FINDINGS: Low lung volumes. Cardiomegaly. Interstitial prominence is improved, not yet normal, consistent with vascular congestion. No consolidation. No effusion or pneumothorax. Old BILATERAL rib fractures. IMPRESSION: Cardiomegaly.  Improved interstitial edema, but not yet clear. Electronically Signed   By: Staci Righter M.D.   On: 06/18/2016 15:51    Scheduled Meds: . amoxicillin-clavulanate  1 tablet Oral BID  . arformoterol  15 mcg Nebulization BID  . aspirin EC  81 mg Oral Daily  . atenolol  25 mg Oral Daily  . budesonide (PULMICORT) nebulizer solution  0.25 mg Nebulization BID  . elvitegravir-cobicistat-emtricitabine-tenofovir  1 tablet Oral Q breakfast  . feeding supplement (ENSURE ENLIVE)  237 mL Oral BID BM  . furosemide  80 mg Oral Daily  . heparin  5,000 Units Subcutaneous Q8H  . insulin glargine  70 Units Subcutaneous BID  . ipratropium-albuterol  3 mL Nebulization Q4H  . liraglutide  1.8 mg Subcutaneous Daily  . methylPREDNISolone (SOLU-MEDROL) injection  60 mg Intravenous Q12H  . nystatin  5 mL Oral QID  . pantoprazole  40 mg Oral Daily  . potassium chloride SA  20 mEq Oral Daily  . pregabalin  225 mg Oral Daily  . rosuvastatin  20 mg Oral Daily    Continuous Infusions: . sodium chloride 10 mL/hr (06/18/16 1646)  . insulin (NOVOLIN-R) infusion 11 Units/hr (06/19/16 4196)     Time spent: 54mins  Kristapher Dubuque MD, PhD  [facility] Pager [phone]. If 7PM-7AM, please contact night-coverage at [email or web address], password [facility] 06/19/2016, 9:12 AM  LOS: 0 days

## 2016-06-19 NOTE — Progress Notes (Signed)
CBG rechecked per orders. CBG reading >600 on meter. Will contact provider on call and obtain stat blood glucose. Pt remains somewhat restless and voiding frequently. Telesitter is at bedside to help prompt patient to stay in bed. Will c/t monitor.

## 2016-06-19 NOTE — Care Management Note (Signed)
Case Management Note  Patient Details  Name: [name] MRN: [id number] Date of Birth: May 08, 1965  Subjective/Objective:                  51 y.o. female with medical history significant of HIV, hypertension, type 2 diabetes, obesity, asthma/COPD and tobacco abuse presented to the emergency department complaining of worsening of shortness of breath for the past week. Associated symptoms include cough and wheezing. She had trials of nebulizers at home which did not help. The past today her shortness of breath has become worse and she decided today to call the ambulance. Upon arrival she was satting at mid 7s she was placed on oxygen and given albuterol treatment. Patient denies nasal congestion, chest pain, palpitation, recent sick contacts and dizziness.  ED Course: Patient received solumedrol x 1, mag and nebulizer treatment with no significant improvement, she continues to desat to mid 80 w/o o2 supplementation   Action/Plan: Date:  June 19, 2016 Chart reviewed for concurrent status and case management needs. Will continue to follow patient progress. Discharge Planning: following for needs Expected discharge date: [id number] Velva Harman, BSN, [place], [place]  Expected Discharge Date:   (unknown)               Expected Discharge Plan:  Home/Self Care  In-House Referral:     Discharge planning Services  CM Consult  Post Acute Care Choice:    Choice offered to:     DME Arranged:    DME Agency:     HH Arranged:    [place] Agency:     Status of Service:  In process, will continue to follow  If discussed at Long Length of Stay Meetings, dates discussed:    Additional Comments:  Leeroy Cha, RN 06/19/2016, 8:42 AM

## 2016-06-19 NOTE — Progress Notes (Addendum)
Patient transferred to stepdown room 1238. Pt belongings with patient including prosthesis, shoes and shirt, brush and charger, all placed in belongings bag and sent with patient. Insulin drip bag arrived from pharmacy shortly before leaving floor so taken to stepdown with patient.

## 2016-06-19 NOTE — Progress Notes (Signed)
BMP results including glucose of 723 paged to [place] per order. Pt continues to be restless and attempting to climb OOB and ignore safety instructions. Telesitter in place, bed alarm in place but patient does not comply. Notified provider of this information as well. Continue to redirect patient. Patient still voiding frequently via womens external device. Will c/t monitor.

## 2016-06-19 NOTE — Progress Notes (Signed)
Transfer orders received. Notified on call secretary to assist with transfer process. Will c/t monitor.

## 2016-06-19 NOTE — Progress Notes (Signed)
Initial Nutrition Assessment  DOCUMENTATION CODES:   Obesity unspecified  INTERVENTION:  - Diet advancement as medically feasible. - RD will monitor for needs with diet advancement.  - Ensure Enlive BID ordered today, each supplement provides 350 kcal and 20 grams of protein; will change to Premier Protein BID, each supplement provides 160 kcal, 5 grams of carb, and 30 grams of protein.   NUTRITION DIAGNOSIS:   Inadequate oral intake related to inability to eat as evidenced by NPO status.  GOAL:   Patient will meet greater than or equal to 90% of their needs  MONITOR:   Diet advancement, Weight trends, Labs  REASON FOR ASSESSMENT:   Malnutrition Screening Tool  ASSESSMENT:   51 y.o. female with medical history significant of HIV, hypertension, type 2 diabetes, obesity, asthma/COPD and tobacco abuse presented to the emergency department complaining of worsening of shortness of breath for the past week. Associated symptoms include cough and wheezing. She had trials of nebulizers at home which did not help. The past today her shortness of breath has become worse and she decided today to call the ambulance. Upon arrival she was satting at mid 25s she was placed on oxygen and given albuterol treatment.  Pt seen for MST. BMI indicates obesity. Pt has been NPO since admission. She is feeling somewhat hungry this AM and denies abdominal pain or nausea this AM or PTA. She reports good appetite and eating well PTA. She reports being told by her Endocrinologist to check CBG x1/day which she does in the morning and is usually 140-150 mg/dL.   Physical assessment shows no muscle or fat wasting; mild edema noted. Pt with L BKA done 11/18/14. Per chart review, pt has lost 12 lbs (4.8% body weight) in the past 3 months which is not significant for time frame.  Medications reviewed; 80 mg IV Lasix x1 dose yesterday, 80 mg oral Lasix/day, 70 units Lantus BID, 60 mg Solu-medrol BID, 40 mg oral  Protonix/day, 5 mL Mycostatin QID, 20 mEq oral KCl/day. Labs reviewed; CBGs: 235-409 mg/dL today, K: 3.2 mmol/L, Cl: 96 mmol/L, BUN: 39 mg/dL, creatinine: 1.44 mg/dL, GFR: 41 mL/min.    Diet Order:  Diet NPO time specified Except for: Other (See Comments), Sips with Meds  Skin:  Reviewed, no issues  Last BM:  6/18  Height:   Ht Readings from Last 1 Encounters:  06/18/16 5\' 10"  (1.778 m)    Weight:   Wt Readings from Last 1 Encounters:  06/18/16 236 lb (107 kg)    Ideal Body Weight:  68.18 kg  BMI:  Body mass index is 33.86 kg/m.  Estimated Nutritional Needs:   Kcal:  1605-1820 (15-17 kcal/kg)  Protein:  70-80 grams  Fluid:  >/= 2 L/day  EDUCATION NEEDS:   No education needs identified at this time    Jarome Matin, MS, RD, LDN, CNSC Inpatient Clinical Dietitian Pager # [phone] After hours/weekend pager # [phone]

## 2016-06-19 NOTE — Progress Notes (Signed)
Report called to Jonelle Sidle, Therapist, sports. Patient will be transferred shortly.

## 2016-06-20 DIAGNOSIS — J441 Chronic obstructive pulmonary disease with (acute) exacerbation: Principal | ICD-10-CM

## 2016-06-20 DIAGNOSIS — N183 Chronic kidney disease, stage 3 (moderate): Secondary | ICD-10-CM

## 2016-06-20 DIAGNOSIS — E118 Type 2 diabetes mellitus with unspecified complications: Secondary | ICD-10-CM

## 2016-06-20 DIAGNOSIS — I5031 Acute diastolic (congestive) heart failure: Secondary | ICD-10-CM

## 2016-06-20 DIAGNOSIS — J9601 Acute respiratory failure with hypoxia: Secondary | ICD-10-CM

## 2016-06-20 DIAGNOSIS — E669 Obesity, unspecified: Secondary | ICD-10-CM

## 2016-06-20 DIAGNOSIS — J4541 Moderate persistent asthma with (acute) exacerbation: Secondary | ICD-10-CM

## 2016-06-20 DIAGNOSIS — R0602 Shortness of breath: Secondary | ICD-10-CM

## 2016-06-20 LAB — BASIC METABOLIC PANEL
ANION GAP: 11 (ref 5–15)
BUN: 42 mg/dL — ABNORMAL HIGH (ref 6–20)
CO2: 28 mmol/L (ref 22–32)
CREATININE: 1.62 mg/dL — AB (ref 0.44–1.00)
Calcium: 8.7 mg/dL — ABNORMAL LOW (ref 8.9–10.3)
Chloride: 95 mmol/L — ABNORMAL LOW (ref 101–111)
GFR calc Af Amer: 41 mL/min — ABNORMAL LOW (ref >60)
GFR, EST NON AFRICAN AMERICAN: 36 mL/min — AB (ref >60)
Glucose, Bld: 288 mg/dL — ABNORMAL HIGH (ref 65–99)
Potassium: 4.2 mmol/L (ref 3.5–5.1)
SODIUM: 134 mmol/L — AB (ref 135–145)

## 2016-06-20 LAB — CBC WITH DIFFERENTIAL/PLATELET
BASOS ABS: 0 10*3/uL (ref 0.0–0.1)
Basophils Relative: 0 %
EOS ABS: 0 10*3/uL (ref 0.0–0.7)
Eosinophils Relative: 0 %
HEMATOCRIT: 36.5 % (ref 36.0–46.0)
Hemoglobin: 11.9 g/dL — ABNORMAL LOW (ref 12.0–15.0)
LYMPHS ABS: 1.6 10*3/uL (ref 0.7–4.0)
Lymphocytes Relative: 12 %
MCH: 26.8 pg (ref 26.0–34.0)
MCHC: 32.6 g/dL (ref 30.0–36.0)
MCV: 82.2 fL (ref 78.0–100.0)
Monocytes Absolute: 0.5 10*3/uL (ref 0.1–1.0)
Monocytes Relative: 4 %
NEUTROS ABS: 11.6 10*3/uL — AB (ref 1.7–7.7)
Neutrophils Relative %: 84 %
PLATELETS: 280 10*3/uL (ref 150–400)
RBC: 4.44 MIL/uL (ref 3.87–5.11)
RDW: 20.6 % — AB (ref 11.5–15.5)
WBC: 13.7 10*3/uL — AB (ref 4.0–10.5)

## 2016-06-20 LAB — GLUCOSE, CAPILLARY
GLUCOSE-CAPILLARY: 235 mg/dL — AB (ref 65–99)
GLUCOSE-CAPILLARY: 310 mg/dL — AB (ref 65–99)
GLUCOSE-CAPILLARY: 337 mg/dL — AB (ref 65–99)
GLUCOSE-CAPILLARY: 370 mg/dL — AB (ref 65–99)

## 2016-06-20 LAB — MAGNESIUM: MAGNESIUM: 1.7 mg/dL (ref 1.7–2.4)

## 2016-06-20 MED ORDER — PREDNISONE 10 MG PO TABS: ORAL_TABLET | ORAL | 0 refills | Status: DC

## 2016-06-20 MED ORDER — INSULIN GLARGINE 100 UNIT/ML ~~LOC~~ SOLN
70.0000 [IU] | Freq: Two times a day (BID) | SUBCUTANEOUS | Status: DC
  Administered 2016-06-20 (×2): 70 [IU] via SUBCUTANEOUS
  Filled 2016-06-20 (×3): qty 0.7

## 2016-06-20 NOTE — Progress Notes (Signed)
Inpatient Diabetes Program Recommendations  AACE/ADA: New Consensus Statement on Inpatient Glycemic Control (2015)  Target Ranges:  Prepandial:   less than 140 mg/dL      Peak postprandial:   less than 180 mg/dL (1-2 hours)      Critically ill patients:  140 - 180 mg/dL   Lab Results  Component Value Date   GLUCAP 337 (H) 06/20/2016   HGBA1C 9.8 (H) 05/20/2016    Review of Glycemic Control  Diabetes history: DM2 Outpatient Diabetes medications: U-500 15-65-15 units tid, Toujeo 70 units bid, metformin 750 mg QAM, Victoza 1.8 mg QD Current orders for Inpatient glycemic control: Lantus 70 units bid, Novolog 0-20 units tidwc and hs, Victoza 1.8 mg QD On Solumedrol 60 mg Q12H  Spoke with pt regarding her glycemic control at home. Pt states she occasionally has low blood sugars and treats with 1/2 c juice. Gives insulin injections in abdomen and rotates sites. Dr. Dwyane Dee is endo. States blood sugars have been elevated recently because of hospitalizations.  Needs more meal coverage insulin.  Recommendations: Add Novolog 10 units tidwc for meal coverage insulin.  Will continue to follow.  Thank you. Lorenda Peck, RD, LDN, CDE Inpatient Diabetes Coordinator [phone]

## 2016-06-20 NOTE — Progress Notes (Addendum)
Date: June 20, 2016 Chart reviewed for discharge orders Home o2 ordered through [facility]. ptar called for transport at 1459. tct-PTAR at 1520-Jennifer at the front communications patient can travel with o2 tank in order to allow [facility] to set up o2 in the home/[place] with [facility] is aware. Vernia Buff, [phone]

## 2016-06-20 NOTE — Discharge Summary (Addendum)
Physician Discharge Summary  [name] DZH:[id number] DOB: Aug 30, 1965 DOA: 06/18/2016  PCP: Erin Harrow, PA-C  Admit date: 06/18/2016 Discharge date: 06/20/2016  Time spent: 45 minutes  Recommendations for Outpatient Follow-up:  Patient will be discharged to home.  Patient will need to follow up with primary care provider within one week of discharge.  Patient should continue medications as prescribed.  Patient should follow a Heart healthy/carb modified diet.   Discharge Diagnoses:  Acute on chronic respiratory failure with hypoxia COPD exacerbation Acute on chronic diastolic heart failure CKD, stage III Essential hypertension HIV/AIDS Obesity Tobacco abuse Diabetes mellitus, type II, complicated with hyperglycemia  Discharge Condition: Stable   Diet recommendation: heart healthy/carbmodified  Filed Weights   06/18/16 1314  Weight: 107 kg (236 lb)    History of present illness:  On 06/18/2016 by Dr. Heide [name] [name] is a 51 y.o. female with medical history significant of HIV, hypertension, type 2 diabetes, obesity, asthma/COPD and tobacco abuse presented to the emergency department complaining of worsening of shortness of breath for the past week. Associated symptoms include cough and wheezing. She had trials of nebulizers at home which did not help. The past today her shortness of breath has become worse and she decided today to call the ambulance. Upon arrival she was satting at mid 84s she was placed on oxygen and given albuterol treatment. Patient denies nasal congestion, chest pain, palpitation, recent sick contacts and dizziness.  Hospital Course:  Acute on chronic respiratory failure with hypoxia -Suppose the patient was started on home oxygen at 2 L recently -Upon admission, patient's O2 sats dropped into the 70s -Was placed on supplemental oxygen however oxygen weaned  -Patient wheelchair bound, however, with exertion, O2 sats dropped  to 86%. Will discharge patient with home oxygen.  -Suspect respiratory failure secondary to COPD/asthma exacerbation, acute on chronic diastolic heart failure -Improved -Discussed continued hospitalization with patient, she is very adamant about going home and feels that she needs to be with her mother for support.  COPD exacerbation -Chest x-ray showed no infiltrate -Patient initially placed on Solu-Medrol, nebs, given 1 dose of Lasix, supplemental oxygen -Discussed smoking cessation -Continue home medications, prednisone taper, antibiotics  Acute on chronic diastolic heart failure -BNP on admission 592.7, chest x-ray showed mild interstitial edema -Last echocardiogram 05/24/2016 [place] 4196%, grade 2 diastolic dysfunction -Patient was given IV Lasix -Continue home regimen upon discharge  CKD, stage III -Upon review of Epic, patient has had CKDstageIII in the past and with had periods of creatinine/GFR improvement. -Suspect given patient's diabetes, heart failure, hypertension, she likely has chronic kidney disease and that her new baseline.  -Would repeat BMP in 1 week.  Essential hypertension -Continue atenolol, Lasix  HIV/AIDS -Last CD4 count on 06/12/2016 was 4:30, viral load undetectable -Continue follow-up with Dr. Johnnye [name], and [name]  Obesity -BMI 33.86 -Continue last, medications, discussed with primary care physician  Tobacco abuse -Discussed smoking cessation  Diabetes mellitus, type II, complicated with hyperglycemia -Suspect hyperglycemia secondary to steroids, suspect CBGs will improve with weaning of prednisone -Last hemoglobin A1c 9.8, on 05/20/2016 -Continue home regimen at discharge  Leukocytosis -Likely reactive to steroids, chest x-ray showed no infiltration, UA unremarkable for infection  Procedures: None  Consultations: None  Discharge Exam: Vitals:   06/20/16 0700 06/20/16 0940  BP: (!) 146/74 (!) 155/63  Pulse: (!) 56 65  Resp: 14     Temp:     Patient states her breathing has much improved, denies any  further coughing or shortness of breath. Denies any chest pain, abdominal pain, nausea vomiting, constipation. Does have loose stools from time to time.   General: Well developed, well nourished, NAD, appears stated age  HEENT: NCAT, mucous membranes moist.  Cardiovascular: S1 S2 auscultated, no rubs, murmurs or gallops. Regular rate and rhythm.  Respiratory: Diminished, clear, no wheezing.  Abdomen: Soft, obese, nontender, nondistended, + bowel sounds  Extremities: warm dry without cyanosis clubbing or edema of the LLE, R BKA  Neuro: AAOx3, nonfocal  Psych: Normal affect and demeanor with intact judgement and insight  Discharge Instructions Discharge Instructions    Discharge instructions    Complete by:  As directed    Patient will be discharged to home.  Patient will need to follow up with primary care provider within one week of discharge.  Patient should continue medications as prescribed.  Patient should follow a Heart healthy/carb modified diet.     Current Discharge Medication List    START taking these medications   Details  predniSONE (DELTASONE) 10 MG tablet Take 4 tabs x 2 days, then 3 tabs x 2 days, then 2 tabs x 2 days, then 1 tab x 2days Qty: 20 tablet, Refills: 0      CONTINUE these medications which have NOT CHANGED   Details  acetaminophen (TYLENOL) 500 MG tablet Take 1,000 mg by mouth every 6 (six) hours as needed for mild pain or moderate pain.    amoxicillin-clavulanate (AUGMENTIN) 875-125 MG tablet Take 1 tablet by mouth 2 (two) times daily. Refills: 0    aspirin EC 81 MG EC tablet Take 1 tablet (81 mg total) by mouth daily.    atenolol (TENORMIN) 25 MG tablet TK 1 T PO QD Refills: 1    budesonide (PULMICORT) 0.5 MG/2ML nebulizer solution Take 0.5 mg by nebulization 2 (two) times daily.    Cholecalciferol (VITAMIN D3) 5000 UNITS TABS Take 5,000 Units by mouth daily.      diphenoxylate-atropine (LOMOTIL) 2.5-0.025 MG tablet Take 1 tablet by mouth 4 (four) times daily as needed for diarrhea or loose stools. Qty: 60 tablet, Refills: 3   Associated Diagnoses: Human immunodeficiency virus (HIV) disease ([name])    Eszopiclone 3 MG TABS Take 1 tablet (3 mg total) by mouth at bedtime as needed. Take immediately before bedtime Qty: 30 tablet, Refills: 3    fenofibrate (TRICOR) 145 MG tablet TK 1 T PO QD Refills: 0    furosemide (LASIX) 80 MG tablet TK 1 T PO QD Refills: 0    GENVOYA 150-150-200-10 MG TABS tablet TAKE 1 TABLET BY MOUTH EVERY DAY Qty: 30 tablet, Refills: 5    glucose blood (ONETOUCH VERIO) test strip Use to test blood sugar 3 times daily Qty: 100 each, Refills: 4    HUMULIN R U-500 KWIKPEN 500 UNIT/ML kwikpen Inject 35-60 Units into the skin. 60 units in the AM, 50 units at lunch, and 35 units at bedtime Refills: 1    HYDROcodone-acetaminophen (NORCO) 10-325 MG tablet TK 1 T PO  Q 8 H Refills: 0    Insulin Glargine (TOUJEO SOLOSTAR) 300 UNIT/ML SOPN Inject 50 Units into the skin 2 (two) times daily. Qty: 9 pen, Refills: 1    ipratropium-albuterol (DUONEB) 0.5-2.5 (3) MG/3ML SOLN Take 3 mLs by nebulization 3 (three) times daily. Qty: 360 mL    LYRICA 225 MG capsule TK 1 C PO ONCE D Refills: 0    metFORMIN (GLUCOPHAGE-XR) 750 MG 24 hr tablet Take 750 mg by mouth daily  with breakfast.  Refills: 3    nystatin (MYCOSTATIN) 100000 UNIT/ML suspension Take 5 mLs (500,000 Units total) by mouth 4 (four) times daily. Qty: 60 mL, Refills: 0    pantoprazole (PROTONIX) 40 MG tablet Take 40 mg by mouth daily. Refills: 0    potassium chloride SA (K-DUR,KLOR-CON) 20 MEQ tablet Take 1 tablet (20 mEq total) by mouth daily. X 3 days, then check potassium levels if needed to continue    rosuvastatin (CRESTOR) 20 MG tablet Take 20 mg by mouth daily. Reported on 05/02/2015 Refills: 0    SYMBICORT 80-4.5 MCG/ACT inhaler INL 2 PFS ITL BID Refills: 0     tiZANidine (ZANAFLEX) 4 MG tablet Take 4 mg by mouth 2 (two) times daily as needed for muscle spasms.     VICTOZA 18 MG/3ML SOPN ADM 1.8 MG [place] D Refills: 1    vitamin E (VITAMIN E) 1000 UNIT capsule Take 1,000 Units by mouth daily.        Allergies  Allergen Reactions  . Cortizone-10 [Hydrocortisone] Rash    Per patient, given local injection at knee and developed rash at local site.    Follow-up Information    [name], [place]. Schedule an appointment as soon as possible for a visit in 1 week(s).   Specialty:  Physician Assistant Why:  Hospital follow-up Contact information: [place] [address] [phone]        Campbell Riches, MD .   Specialty:  Infectious Diseases Contact information: [place] [address] [phone]            The results of significant diagnostics from this hospitalization (including imaging, microbiology, ancillary and laboratory) are listed below for reference.    Significant Diagnostic Studies: Dg Chest 2 View  Result Date: 06/18/2016 CLINICAL DATA:  Patient from home called EMS for shortness of breath. Wheezing. History of HIV. History of hypertension. EXAM: CHEST  2 VIEW COMPARISON:  06/15/2016. FINDINGS: Low lung volumes. Cardiomegaly. Interstitial prominence is improved, not yet normal, consistent with vascular congestion. No consolidation. No effusion or pneumothorax. Old BILATERAL rib fractures. IMPRESSION: Cardiomegaly.  Improved interstitial edema, but not yet clear. Electronically Signed   By: Staci Righter M.D.   On: 06/18/2016 15:51   Ct Angio Chest Pe W Or Wo Contrast  Result Date: 05/23/2016 CLINICAL DATA:  Altered mental status.  Low O2 saturation. EXAM: CT ANGIOGRAPHY CHEST WITH CONTRAST TECHNIQUE: Multidetector CT imaging of the chest was performed using the standard protocol during bolus administration of intravenous contrast. Multiplanar CT image reconstructions  and MIPs were obtained to evaluate the vascular anatomy. CONTRAST:  100 cc Isovue 370 COMPARISON:  Chest x-ray dated 05/20/2016 FINDINGS: Cardiovascular: There is borderline cardiomegaly. No pulmonary emboli. No pericardial effusion. Minimal calcification in the thoracic aorta. Mediastinum/Nodes: No enlarged mediastinal, hilar, or axillary lymph nodes. Thyroid gland, trachea, and esophagus demonstrate no significant findings. Lungs/Pleura: There is prominent peribronchial thickening particularly in the lower lobes with marked narrowing of some of the bronchi in the lower lobes with linear atelectasis at both lung bases. There is also peribronchial thickening in the upper lobes. No discrete consolidative infiltrates or effusions. Upper Abdomen: No acute abnormality. Musculoskeletal: No chest wall abnormality. No acute or significant osseous findings. Review of the MIP images confirms the above findings. IMPRESSION: 1. No pulmonary emboli. 2. Very prominent peribronchial thickening with occlusion of some of the lower lobe bronchi with atelectasis at the lung bases. Electronically Signed  By: Lorriane Shire M.D.   On: 05/23/2016 16:00    Microbiology: Recent Results (from the past 240 hour(s))  MRSA PCR Screening     Status: None   Collection Time: 06/19/16  4:38 AM  Result Value Ref Range Status   MRSA by PCR NEGATIVE NEGATIVE Final    Comment:        The GeneXpert MRSA Assay (FDA approved for NASAL specimens only), is one component of a comprehensive MRSA colonization surveillance program. It is not intended to diagnose MRSA infection nor to guide or monitor treatment for MRSA infections.      Labs: Basic Metabolic Panel:  Recent Labs Lab 06/18/16 1426 06/18/16 2230 06/19/16 0135 06/19/16 0932 06/19/16 1300 06/20/16 0357  NA 137  --  130* 137 136 134*  K 4.3  --  3.9 3.2* 4.0 4.2  CL 100*  --  91* 96* 94* 95*  CO2 25  --  24 29 32 28  GLUCOSE 335* 814* 723* 280* 353* 288*  BUN  27*  --  39* 39* 37* 42*  CREATININE 1.44*  --  1.70* 1.44* 1.40* 1.62*  CALCIUM 8.7*  --  8.9 9.3 9.4 8.7*  MG  --   --   --   --   --  1.7   Liver Function Tests:  Recent Labs Lab 06/18/16 1426  AST 23  ALT 19  ALKPHOS 93  BILITOT 1.0  PROT 7.1  ALBUMIN 2.9*   No results for input(s): LIPASE, AMYLASE in the last 168 hours. No results for input(s): AMMONIA in the last 168 hours. CBC:  Recent Labs Lab 06/18/16 1356 06/19/16 0135 06/20/16 0357  WBC 11.3* 7.8 13.7*  NEUTROABS 8.2*  --  11.6*  HGB 11.9* 11.8* 11.9*  HCT 36.6 36.7 36.5  MCV 83.6 83.2 82.2  PLT PLATELET CLUMPS NOTED ON SMEAR, UNABLE TO ESTIMATE 303 280   Cardiac Enzymes:  Recent Labs Lab 06/18/16 1636  TROPONINI <0.03   BNP: BNP (last 3 results)  Recent Labs  05/19/16 2217 05/23/16 1205 06/18/16 1636  BNP 520.0* 119.1* 592.7*    ProBNP (last 3 results) No results for input(s): PROBNP in the last 8760 hours.  CBG:  Recent Labs Lab 06/19/16 2307 06/20/16 0018 06/20/16 0146 06/20/16 0841 06/20/16 1250  GLUCAP 291* 310* 235* 337* 370*       Signed:  Cristal Ford  [facility] 06/20/2016, 1:34 PM

## 2016-06-20 NOTE — Progress Notes (Addendum)
Patient is unable to walk. She is wheelchair bound at baseline. Patient wheeled herself approximately 180 feet in the unit, and her oxygen saturation dropped to 86 percent on room air. Patient's oxygen saturation was 92 percent on room air at rest before the activity, and 91 percent post activity.   Patient's oxygen saturation is 95 percent on 2 liters oxygen at rest pre activity,  and 93 percent post activity.   Alternate treatments are insufficient and patient will need home oxygen.

## 2016-06-20 NOTE — Discharge Instructions (Signed)
Shortness of Breath, Adult Shortness of breath is when a person has trouble breathing enough air, or when a person feels like she or he is having trouble breathing in enough air. Shortness of breath could be a sign of medical problem. Follow these instructions at home: Pay attention to any changes in your symptoms. Take these actions to help with your condition:  Do not smoke. Smoking is a common cause of shortness of breath. If you smoke and you need help quitting, ask your health care provider.  Avoid things that can irritate your airways, such as: ? Mold. ? Dust. ? Air pollution. ? Chemical fumes. ? Things that can cause allergy symptoms (allergens), if you have allergies.  Keep your living space clean and free of mold and dust.  Rest as needed. Slowly return to your usual activities.  Take over-the-counter and prescription medicines, including oxygen and inhaled medicines, only as told by your health care provider.  Keep all follow-up visits as told by your health care provider. This is important.  Contact a health care provider if:  Your condition does not improve as soon as expected.  You have a hard time doing your normal activities, even after you rest.  You have new symptoms. Get help right away if:  Your shortness of breath gets worse.  You have shortness of breath when you are resting.  You feel light-headed or you faint.  You have a cough that is not controlled with medicines.  You cough up blood.  You have pain with breathing.  You have pain in your chest, arms, shoulders, or abdomen.  You have a fever.  You cannot walk up stairs or exercise the way that you normally do. This information is not intended to replace advice given to you by your health care provider. Make sure you discuss any questions you have with your health care provider. Document Released: 09/12/2000 Document Revised: 07/09/2015 Document Reviewed: 05/26/2015 Elsevier Interactive Patient  Education  2018 [employer]  

## 2016-06-21 ENCOUNTER — Telehealth: Payer: Self-pay | Admitting: Physician Assistant

## 2016-06-21 ENCOUNTER — Other Ambulatory Visit: Payer: Self-pay

## 2016-06-21 MED ORDER — INSULIN GLARGINE 300 UNIT/ML ~~LOC~~ SOPN: 50.0000 [IU] | PEN_INJECTOR | Freq: Two times a day (BID) | SUBCUTANEOUS | 1 refills | Status: DC

## 2016-06-21 MED ORDER — GLUCOSE BLOOD VI STRP: ORAL_STRIP | 4 refills | Status: DC

## 2016-06-21 NOTE — Telephone Encounter (Signed)
Submitted

## 2016-06-21 NOTE — Telephone Encounter (Signed)
Patient is calling for lab results done at last visit.  Looking for CD-4 and Viral load .  Patient verified date of birth and last office visit.  Lab results given.   Orland Mustard, RN

## 2016-06-21 NOTE — Telephone Encounter (Signed)
[employer] needs the one touch verio strips to be pre approved we will have to submit the PA for these to be approved

## 2016-06-21 NOTE — Progress Notes (Signed)
CSW consulted due to HIV d/o ( not a new dx ). Pt was d/c prior to being sen by CSW.  Werner Lean LCSW [phone]

## 2016-06-21 NOTE — Telephone Encounter (Signed)
**  Remind patient they can make refill requests via MyChart**  Medication refill request (Name & Dosage): glucose blood (ONETOUCH VERIO) test strip  Insulin Glargine (TOUJEO SOLOSTAR) 300 UNIT/ML SOPN  Preferred pharmacy (Name & Address):  [facility] [place], [address] [place]   Other comments (if applicable):

## 2016-06-21 NOTE — Telephone Encounter (Signed)
Patient calling to check on PA for one touch verio test strips.  Thank you,  -LL

## 2016-06-21 NOTE — Telephone Encounter (Signed)
Have not received any information for a PA for this patient will verify with the pharmacy

## 2016-06-24 NOTE — Progress Notes (Signed)
Patient ID: [name], female   DOB: [birth year], 51 y.o.   MRN: [id number]           Reason for Appointment: Follow-up  for Type 2 Diabetes   History of Present Illness:          Date of diagnosis of type 2 diabetes mellitus: 2000       Background history:     The patient was apparently diagnosed incidentally with her diabetes 16 years ago 16 years ago, was asymptomatic. She took metformin and possibly other oral hypoglycemic drugs but subsequently went on insulin in 2005 She was taking various insulin regimens in the past  She was being treated by an endocrinologist some time ago and he switched her To Toujeo and also tried her on Byetta.  She thinks that she had better blood sugars with Byetta but she did not follow-up  Novolog was switched to U-500 insulin in 3/17   Recent history:   INSULIN regimen is:  Toujeo 70 units bid  Humulin R U-500:30 units am, 60 units before lunch , Novolog 35 acs  Non-insulin hypoglycemic drugs the patient is taking are: Victoza 1.8mg     Her A1c is  higher at 10.3, previously 8.2  Current management, blood sugar patterns and problems identified:  She has had several hospitalizations since her visit in March  Most recently she was admitted about a week ago and with getting steroids her blood sugar had gone up to over 700 in the hospital and when she came back it was 387  Without steroids recently her blood sugars are looking progressively lower and near normal  However she is checking blood sugar very sporadically with the following readings since she came back home  Morning blood sugars 72, 139; early afternoon 70, 93; late evening 95-1 11  Previously was having morning readings as high as 245 and nighttime as high as 200  INSULIN: On her own she has increased her lunchtime dose up to 60 units, previously had been recommended 50 units  However her blood sugar patterns are difficult to assess  She is still taking the same amount of  insulin regardless of what she is eating even in the morning when she is eating a yogurt only  She thinks her blood sugar got low during the night last night  TOUJEO dose has been continued unchanged  She thinks she is taking her Victoza daily     Side effects from medications have been: None  Compliance with the medical regimen:  fair Hypoglycemia:  rarely low at 4-6pm Glucose monitoring:  done about 1 times a day         Glucometer: One Touch Verio.       Blood Glucose readings as above    Self-care:   Typical meal intake: Breakfast is mostly a yogurt if eating   Lunch is variable at 3 pm, has mixed meal at dinnertime, sometimes skipped Has variable amounts of snacks.  She is avoiding drinks with sugar                Dietician visit, most recent: At [facility]               Exercise:  none  Weight history:  Wt Readings from Last 3 Encounters:  06/25/16 203 lb 6.4 oz (92.3 kg)  06/18/16 236 lb (107 kg)  06/12/16 236 lb (107 kg)    Glycemic control: 11 in 8/16   Lab Results  Component Value  Date   HGBA1C 9.8 (H) 05/20/2016   HGBA1C 10.3 03/05/2016   HGBA1C 8.2 (H) 09/30/2015   Lab Results  Component Value Date   MICROALBUR 1.2 03/05/2016   LDLCALC 59 06/12/2016   CREATININE 1.62 (H) 06/20/2016    Other active problems: See review of systems    Allergies as of 06/25/2016      Reactions   Cortizone-10 [hydrocortisone] Rash   Per patient, given local injection at knee and developed rash at local site.       Medication List       Accurate as of 06/25/16  9:18 AM. Always use your most recent med list.          acetaminophen 500 MG tablet Commonly known as:  TYLENOL Take 1,000 mg by mouth every 6 (six) hours as needed for mild pain or moderate pain.   amoxicillin-clavulanate 875-125 MG tablet Commonly known as:  AUGMENTIN Take 1 tablet by mouth 2 (two) times daily.   aspirin 81 MG EC tablet Take 1 tablet (81 mg total) by mouth daily.     atenolol 25 MG tablet Commonly known as:  TENORMIN TK 1 T PO QD   budesonide 0.5 MG/2ML nebulizer solution Commonly known as:  PULMICORT Take 0.5 mg by nebulization 2 (two) times daily.   diphenoxylate-atropine 2.5-0.025 MG tablet Commonly known as:  LOMOTIL Take 1 tablet by mouth 4 (four) times daily as needed for diarrhea or loose stools.   Eszopiclone 3 MG Tabs Take 1 tablet (3 mg total) by mouth at bedtime as needed. Take immediately before bedtime   fenofibrate 145 MG tablet Commonly known as:  TRICOR TK 1 T PO QD   furosemide 80 MG tablet Commonly known as:  LASIX TK 1 T PO QD   GENVOYA 150-150-200-10 MG Tabs tablet Generic drug:  elvitegravir-cobicistat-emtricitabine-tenofovir TAKE 1 TABLET BY MOUTH EVERY DAY   glucose blood test strip Commonly known as:  ONETOUCH VERIO Use to test blood sugar 3 times daily   HUMULIN R U-500 KWIKPEN 500 UNIT/ML kwikpen Generic drug:  insulin regular human CONCENTRATED Inject 35-60 Units into the skin. 60 units in the AM, 50 units at lunch, and 35 units at bedtime   HYDROcodone-acetaminophen 10-325 MG tablet Commonly known as:  NORCO TK 1 T PO  Q 8 H   Insulin Glargine 300 UNIT/ML Sopn Commonly known as:  TOUJEO SOLOSTAR Inject 50 Units into the skin 2 (two) times daily.   ipratropium-albuterol 0.5-2.5 (3) MG/3ML Soln Commonly known as:  DUONEB Take 3 mLs by nebulization 3 (three) times daily.   LYRICA 225 MG capsule Generic drug:  pregabalin TK 1 C PO ONCE D   metFORMIN 750 MG 24 hr tablet Commonly known as:  GLUCOPHAGE-XR Take 750 mg by mouth daily with breakfast.   nystatin 100000 UNIT/ML suspension Commonly known as:  MYCOSTATIN Take 5 mLs (500,000 Units total) by mouth 4 (four) times daily.   pantoprazole 40 MG tablet Commonly known as:  PROTONIX Take 40 mg by mouth daily.   potassium chloride SA 20 MEQ tablet Commonly known as:  K-DUR,KLOR-CON Take 1 tablet (20 mEq total) by mouth daily. X 3 days, then  check potassium levels if needed to continue   rosuvastatin 20 MG tablet Commonly known as:  CRESTOR Take 20 mg by mouth daily. Reported on 05/02/2015   SYMBICORT 80-4.5 MCG/ACT inhaler Generic drug:  budesonide-formoterol INL 2 PFS ITL BID   tiZANidine 4 MG tablet Commonly known as:  ZANAFLEX Take 4  mg by mouth 2 (two) times daily as needed for muscle spasms.   VICTOZA 18 MG/3ML Sopn Generic drug:  liraglutide ADM 1.8 MG [place] D   Vitamin D3 5000 units Tabs Take 5,000 Units by mouth daily.   vitamin E 1000 UNIT capsule Generic drug:  vitamin E Take 1,000 Units by mouth daily.       Allergies:  Allergies  Allergen Reactions  . Cortizone-10 [Hydrocortisone] Rash    Per patient, given local injection at knee and developed rash at local site.     Past Medical History:  Diagnosis Date  . Anxiety   . Arthritis    LEFT HIP  . Asthma    HOSPITALIZED WITH EXCERBATION OF ASTHMA - AND BRONCHITIS AND THE FLU DEC 2013  . Depression   . Diabetes mellitus    ON INSULIN AND ORAL MEDICATIONS  . GERD (gastroesophageal reflux disease)   . HIV infection ([place]) 2000  . Hypertension   . Neuropathy    NEUROPATHY HANDS AND FEET  . Pain    SEVERE PAIN LEFT HIP - HX OF LEFT HIP PINNING JULY 2013  . Shortness of breath    ALLERGIES ARE "ACTING UP"    Past Surgical History:  Procedure Laterality Date  . AMPUTATION Left 11/18/2014   Procedure: LEFT BELOW KNEE AMPUTATION;  Surgeon: Leandrew Koyanagi, MD;  Location: [facility];  Service: Orthopedics;  Laterality: Left;  . CAST APPLICATION Left 08/02/4233   Procedure: CAST APPLICATION (FIBERGLASS);  Surgeon: Latanya Maudlin, MD;  Location: [facility];  Service: Orthopedics;  Laterality: Left;  CLOSED REDUCTION OF LEFT COLLES FRACTURE WITH SHORT ARM CAST  . HARDWARE REMOVAL Left 05/05/2014   Procedure: HARDWARE REMOVAL LEFT HIP;  Surgeon: Latanya Maudlin, MD;  Location: [facility];  Service: Orthopedics;  Laterality: Left;  REMOVAL BIOMET 6.5-8.0 CANNULATED SCREW    . HIP ARTHROPLASTY Left 05/05/2014   Procedure: ARTHROPLASTY OPEN REDUCTION INTERNAL FIXATION LEFT HIP AND REMOVAL OF TWO CANNULATED SCREW;  Surgeon: Latanya Maudlin, MD;  Location: [facility];  Service: Orthopedics;  Laterality: Left;  . HIP PINNING,CANNULATED  07/16/2011   Procedure: CANNULATED HIP PINNING;  Surgeon: Gearlean Alf, MD;  Location: [facility];  Service: Orthopedics;  Laterality: Left;  . HIP PINNING,CANNULATED Left 04/09/2012   Procedure: CANNULATED HIP PINNING AND HARDWARE REVISION;  Surgeon: Gearlean Alf, MD;  Location: [facility];  Service: Orthopedics;  Laterality: Left;  . I&D EXTREMITY Left 11/16/2014   Procedure:  DEBRIDEMENT OF LEFT FOOT POSSIBLE APPLICATION OF INTEGRIA AND VAC ;  Surgeon: Irene Limbo, MD;  Location: [facility];  Service: Plastics;  Laterality: Left;  . PERIPHERAL VASCULAR CATHETERIZATION N/A 11/15/2014   Procedure: Abdominal Aortogram;  Surgeon: Conrad [place], MD;  Location: [facility];  Service: Cardiovascular;  Laterality: N/A;  . PERIPHERAL VASCULAR CATHETERIZATION  11/15/2014   Procedure: Lower Extremity Angiography;  Surgeon: Conrad [place], MD;  Location: [facility];  Service: Cardiovascular;;  . PERIPHERAL VASCULAR CATHETERIZATION Left 11/15/2014   Procedure: Peripheral Vascular Intervention;  Surgeon: Conrad , MD;  Location: [facility];  Service: Cardiovascular;  Laterality: Left;  popliteal artery stenting    Family History  Problem Relation Age of Onset  . Diabetes Mother   . Hypertension Mother   . Vision loss Mother   . Heart disease Mother   . Hypertension Father   . Thyroid disease Neg Hx     Social History:  reports that she has been smoking E-cigarettes  and Cigarettes.  She has a 17.50 pack-year smoking history. She has never used smokeless tobacco. She reports that she does not drink alcohol or use drugs.    Review of Systems   She was told to take prednisone at the time of discharge last week but she has not  picked up the prescription but she is not having any wheezing or difficulty with respiration now  Lipid history: Has been treated with  Fenofibrate and Crestor by PCP     Lab Results  Component Value Date   CHOL 139 06/12/2016   HDL 49 (L) 06/12/2016   LDLCALC 59 06/12/2016   TRIG 154 (H) 06/12/2016   CHOLHDL 2.8 06/12/2016          She takes multiple psychotropic drugs   Physical Examination:  BP 132/84   Pulse 93   Ht 5\' 10"  (1.778 m)   Wt 203 lb 6.4 oz (92.3 kg)   LMP 11/16/2010   SpO2 91%   BMI 29.18 kg/m       ASSESSMENT:  Diabetes type 2, uncontrolled    See history of present illness for detailed discussion of current diabetes management, blood sugar patterns and problems identified  Her blood sugars have been quite variable based on her general medical conditions and having numerous hospitalizations and change in her medication Glucose was over 700 during hospitalization recently after steroid doses Although blood sugars have been mostly high even before her last hospitalization the last few days of blood sugars have been getting progressively lower including reportedly low sugar episode during the night She is taking higher doses of the Humulin R as directed on her last visit but does not appear to need as much, however her blood sugars are not showing any consistent pattern  RENAL dysfunction: She will need to discuss this and also her anemia with PCP   PLAN:   She will try to check blood sugars at least twice a day and rotate the times of checking her blood sugars She needs to consistently use the One Touch Verio monitor and not the generic that she has Since she is doing reasonably well recently with blood sugars near normal will not switch her to the V-go pump but may consider this in the future if blood sugars are variable She will need to reduce her morning dose down to 20 units if not eating much Reduce suppertime dose to 30 units and keep her portions  and carbohydrates controlled, she will look at her sugars after supper and keep them within target as discussed with going up or down 5 units on her insulin Reduce Tresiba by 5 units twice a day and take only 65 Continue Victoza She can continue low dose metformin since her renal function is adequate for this dose of 750 mg Advised her not to take prednisone now since she is not having a respiratory difficulty and this would significantly worsen her diabetes    Patient Instructions  Check blood sugars on waking up  3-4/7 days  Also check blood sugars about 2 hours after a meal 2x daily and do this after different meals by rotation  Recommended blood sugar levels on waking up is 90-130 and about 2 hours after meal is 130-160  Please bring your blood sugar monitor to each visit, thank you  Toujeo 65 2x daily  Humulin R 20-30 in am based on meal size   30 at supper, 30 min before     Counseling time on subjects  discussed above is over 50% of today's 25 minute visit   Lynton Crescenzo 06/25/2016, 9:18 AM   Note: This office note was prepared with Estate agent. Any transcriptional errors that result from this process are unintentional.

## 2016-06-25 ENCOUNTER — Encounter: Payer: Self-pay | Admitting: Endocrinology

## 2016-06-25 ENCOUNTER — Ambulatory Visit (INDEPENDENT_AMBULATORY_CARE_PROVIDER_SITE_OTHER): Payer: Medicare HMO | Admitting: Endocrinology

## 2016-06-25 ENCOUNTER — Telehealth: Payer: Self-pay

## 2016-06-25 ENCOUNTER — Telehealth: Payer: Self-pay | Admitting: Licensed Clinical Social Worker

## 2016-06-25 VITALS — BP 132/84 | HR 93 | Ht 70.0 in | Wt 203.4 lb

## 2016-06-25 DIAGNOSIS — Z794 Long term (current) use of insulin: Secondary | ICD-10-CM | POA: Diagnosis not present

## 2016-06-25 DIAGNOSIS — J449 Chronic obstructive pulmonary disease, unspecified: Secondary | ICD-10-CM | POA: Diagnosis not present

## 2016-06-25 DIAGNOSIS — I509 Heart failure, unspecified: Secondary | ICD-10-CM | POA: Diagnosis not present

## 2016-06-25 DIAGNOSIS — N289 Disorder of kidney and ureter, unspecified: Secondary | ICD-10-CM | POA: Diagnosis not present

## 2016-06-25 DIAGNOSIS — E1165 Type 2 diabetes mellitus with hyperglycemia: Secondary | ICD-10-CM

## 2016-06-25 DIAGNOSIS — Z21 Asymptomatic human immunodeficiency virus [HIV] infection status: Secondary | ICD-10-CM | POA: Diagnosis not present

## 2016-06-25 DIAGNOSIS — N189 Chronic kidney disease, unspecified: Secondary | ICD-10-CM | POA: Diagnosis not present

## 2016-06-25 DIAGNOSIS — Z09 Encounter for follow-up examination after completed treatment for conditions other than malignant neoplasm: Secondary | ICD-10-CM | POA: Diagnosis not present

## 2016-06-25 DIAGNOSIS — M549 Dorsalgia, unspecified: Secondary | ICD-10-CM | POA: Diagnosis not present

## 2016-06-25 NOTE — Patient Instructions (Addendum)
Check blood sugars on waking up  3-4/7 days  Also check blood sugars about 2 hours after a meal 2x daily and do this after different meals by rotation  Recommended blood sugar levels on waking up is 90-130 and about 2 hours after meal is 130-160  Please bring your blood sugar monitor to each visit, thank you  Toujeo 65 2x daily  Humulin R 20-30 in am based on meal size   30 at supper, 30 min before

## 2016-06-25 NOTE — Telephone Encounter (Signed)
Spoke with patient about her current mental health services and those she desired.  She stated that she is currently receiving in-home therapy from provider named Colletta [place] that she is satisfied with.  She stated that she does not want to terminate services with her.  However she stated that she is not receiving medication management services at this time.  She reported that she has appointment with Colletta [place] for later this week and that she is going to bring a list of psychiatric providers within network for her to choose from.  Sande Rives, [facility]

## 2016-06-25 NOTE — Telephone Encounter (Signed)
SENT NOTES TO SCHEDULING 

## 2016-06-29 ENCOUNTER — Ambulatory Visit (INDEPENDENT_AMBULATORY_CARE_PROVIDER_SITE_OTHER): Payer: Medicare HMO | Admitting: Internal Medicine

## 2016-06-29 VITALS — BP 128/72 | HR 54 | Ht 70.0 in | Wt 236.0 lb

## 2016-06-29 DIAGNOSIS — E782 Mixed hyperlipidemia: Secondary | ICD-10-CM | POA: Diagnosis not present

## 2016-06-29 DIAGNOSIS — I5032 Chronic diastolic (congestive) heart failure: Secondary | ICD-10-CM

## 2016-06-29 DIAGNOSIS — E1142 Type 2 diabetes mellitus with diabetic polyneuropathy: Secondary | ICD-10-CM | POA: Diagnosis not present

## 2016-06-29 DIAGNOSIS — B20 Human immunodeficiency virus [HIV] disease: Secondary | ICD-10-CM | POA: Diagnosis not present

## 2016-06-29 DIAGNOSIS — I252 Old myocardial infarction: Secondary | ICD-10-CM | POA: Diagnosis not present

## 2016-06-29 DIAGNOSIS — J45901 Unspecified asthma with (acute) exacerbation: Secondary | ICD-10-CM | POA: Diagnosis not present

## 2016-06-29 DIAGNOSIS — E1122 Type 2 diabetes mellitus with diabetic chronic kidney disease: Secondary | ICD-10-CM | POA: Diagnosis not present

## 2016-06-29 DIAGNOSIS — N184 Chronic kidney disease, stage 4 (severe): Secondary | ICD-10-CM | POA: Diagnosis not present

## 2016-06-29 DIAGNOSIS — J449 Chronic obstructive pulmonary disease, unspecified: Secondary | ICD-10-CM | POA: Diagnosis not present

## 2016-06-29 DIAGNOSIS — I1310 Hypertensive heart and chronic kidney disease without heart failure, with stage 1 through stage 4 chronic kidney disease, or unspecified chronic kidney disease: Secondary | ICD-10-CM | POA: Diagnosis not present

## 2016-06-29 DIAGNOSIS — I10 Essential (primary) hypertension: Secondary | ICD-10-CM

## 2016-06-29 DIAGNOSIS — J9621 Acute and chronic respiratory failure with hypoxia: Secondary | ICD-10-CM | POA: Diagnosis not present

## 2016-06-29 NOTE — Progress Notes (Addendum)
Cardiology Office Note   Date:  06/29/2016   ID:  [name], DOB 05/15/1965, MRN [id number]  PCP:  Michel Harrow, PA-C  Cardiologist:   Dorris Carnes, MD   F/U of  Diastolic CHF      History of Present Illness: [name] is a 51 y.o. female with a history of resp failure  REcently admitted to [facility]  She had an echo donwe on 5/24  LVEF normal at 34 to 65% with mod LVH  Gr II diastolic dysfunction   She was seen by Cardiology in May 2018  Pt get weak, less responsive  O2 low   Elevated trop due to demand  Also ahistory of L BKA, PVOD, HTN, HIV  DM  HL  GeRED   Since last discharge was sent home on oxygen  Not using it a lot   OCcasional SOB   Denies CP    Current Meds  Medication Sig  . acetaminophen (TYLENOL) 500 MG tablet Take 1,000 mg by mouth every 6 (six) hours as needed for mild pain or moderate pain.  [name] Kitchen aspirin EC 81 MG EC tablet Take 1 tablet (81 mg total) by mouth daily.  [name] Kitchen atenolol (TENORMIN) 25 MG tablet TK 1 T PO QD  . budesonide (PULMICORT) 0.5 MG/2ML nebulizer solution Take 0.5 mg by nebulization 2 (two) times daily.  . Cholecalciferol (VITAMIN D3) 5000 UNITS TABS Take 5,000 Units by mouth daily.   . diphenoxylate-atropine (LOMOTIL) 2.5-0.025 MG tablet Take 1 tablet by mouth 4 (four) times daily as needed for diarrhea or loose stools.  . Eszopiclone 3 MG TABS Take 1 tablet (3 mg total) by mouth at bedtime as needed. Take immediately before bedtime  . fenofibrate (TRICOR) 145 MG tablet TK 1 T PO QD  . furosemide (LASIX) 80 MG tablet TK 1 T PO QD  . GENVOYA 150-150-200-10 MG TABS tablet TAKE 1 TABLET BY MOUTH EVERY DAY  . glucose blood (ONETOUCH VERIO) test strip Use to test blood sugar 3 times daily  . HUMULIN R U-500 KWIKPEN 500 UNIT/ML kwikpen Inject 35-60 Units into the skin. 60 units in the AM, 50 units at lunch, and 35 units at bedtime  . HYDROcodone-acetaminophen (NORCO) 10-325 MG tablet TK 1 T PO  Q 8 H  . Insulin Glargine (TOUJEO  SOLOSTAR) 300 UNIT/ML SOPN Inject 50 Units into the skin 2 (two) times daily. (Patient taking differently: Inject 70 Units into the skin 2 (two) times daily. )  . ipratropium-albuterol (DUONEB) 0.5-2.5 (3) MG/3ML SOLN Take 3 mLs by nebulization 3 (three) times daily.  [name] Kitchen LYRICA 225 MG capsule TK 1 C PO ONCE D  . metFORMIN (GLUCOPHAGE-XR) 750 MG 24 hr tablet Take 750 mg by mouth daily with breakfast.   . nystatin (MYCOSTATIN) 100000 UNIT/ML suspension Take 5 mLs (500,000 Units total) by mouth 4 (four) times daily.  . pantoprazole (PROTONIX) 40 MG tablet Take 40 mg by mouth daily.  . potassium chloride SA (K-DUR,KLOR-CON) 20 MEQ tablet Take 1 tablet (20 mEq total) by mouth daily. X 3 days, then check potassium levels if needed to continue  . rosuvastatin (CRESTOR) 20 MG tablet Take 20 mg by mouth daily. Reported on 05/02/2015  . SYMBICORT 80-4.5 MCG/ACT inhaler INL 2 PFS ITL BID  . tiZANidine (ZANAFLEX) 4 MG tablet Take 4 mg by mouth 2 (two) times daily as needed for muscle spasms.   [name] Kitchen VICTOZA 18 MG/3ML SOPN ADM 1.8 MG [place] D  . [DISCONTINUED] amoxicillin-clavulanate (  AUGMENTIN) 875-125 MG tablet Take 1 tablet by mouth 2 (two) times daily.  . [DISCONTINUED] vitamin E (VITAMIN E) 1000 UNIT capsule Take 1,000 Units by mouth daily.      Allergies:   Cortizone-10 [hydrocortisone]   Past Medical History:  Diagnosis Date  . Acute metabolic encephalopathy 3/70/4888  . Acute on chronic respiratory failure with hypoxia ([place]) 05/19/2016  . Anxiety   . ARF (acute renal failure) ([place]) 05/03/2014  . Arthritis    LEFT HIP  . Asthma    HOSPITALIZED WITH EXCERBATION OF ASTHMA - AND BRONCHITIS AND THE FLU DEC 2013  . Asthma exacerbation attacks 12/28/2011  . Bronchitis 12/28/2011  . CAP (community acquired pneumonia) 07/25/2012  . Chronic kidney disease (CKD), stage IV (severe) ([place]) 11/10/2014  . Class 3 obesity due to excess calories with serious comorbidity and body mass index (BMI) of 40.0 to 44.9 in adult  [facility])   . Colles' fracture of left radius   . Depression   . Diabetes mellitus    ON INSULIN AND ORAL MEDICATIONS  . Diabetes mellitus type 2 with complications, uncontrolled ([place]) 04/19/2008   Qualifier: Diagnosis of  By: Tomma Lightning MD, Claiborne Billings     . Diabetic foot infection ([place]) 11/09/2014  . Diabetic ketoacidosis without coma associated with type 2 diabetes mellitus ([place])   . Diabetic neuropathy ([place]) 11/10/2014  . Diarrhea 08/30/2008   Qualifier: Diagnosis of  By: Tomma Lightning MD, Claiborne Billings    . Diastolic dysfunction 91/06/9448  . Difficult intravenous access   . DKA, type 2 ([place]) 04/04/2016  . Dyslipidemia 12/23/2008   Qualifier: Diagnosis of  By: Tomma Lightning MD, Claiborne Billings    . Essential hypertension 04/19/2008   Qualifier: Diagnosis of  By: Tomma Lightning MD, Claiborne Billings    . FATIGUE 07/12/2008   Qualifier: Diagnosis of  By: Tomma Lightning MD, Claiborne Billings    . Femoral neck fracture ([place]) 07/16/2011  . Femur fracture, left ([place]) 07/08/2014  . Fever   . Fracture of distal femur ([place]) 07/08/2014  . GERD (gastroesophageal reflux disease)   . Hereditary and idiopathic peripheral neuropathy 04/19/2008   Qualifier: Diagnosis of  By: Tomma Lightning MD, Claiborne Billings    . Hip fracture, left ([place]) 07/16/2011  . Hip pain   . HIV infection ([place]) 2000  . Human immunodeficiency virus (HIV) disease ([place]) 04/19/2008   [id number] +   . Hyperlipidemia   . Hypertension   . Hyponatremia 12/29/2011  . Influenza A 12/29/2011  . Influenza B   . Insomnia 06/12/2016  . Left hip pain 05/03/2014  . Mood disorder ([place]) 04/19/2008   Qualifier: Diagnosis of  By: Tomma Lightning MD, Claiborne Billings    . Neuropathy    NEUROPATHY HANDS AND FEET  . NSTEMI (non-ST elevated myocardial infarction) ([place]) 05/19/2016  . Obesity hypoventilation syndrome ([place])   . Osteoporosis 07/08/2014  . Pain    SEVERE PAIN LEFT HIP - HX OF LEFT HIP PINNING JULY 2013  . Perimenopausal symptoms 02/13/2012   LMP around 2011. On estrace and provera since around 2012 for hot flashes, mood swings. Estrace 2 mg daily, Provera  2.5 mg for 5 days each month.   . Repeated falls 07/30/2011  . Sepsis ([place]) 04/04/2016  . Shortness of breath    ALLERGIES ARE "ACTING UP"  . SOB (shortness of breath)   . THRUSH 05/20/2008   Qualifier: Diagnosis of  By: Tomma Lightning MD, Claiborne Billings      . Tobacco use disorder 04/14/2010    Past Surgical History:  Procedure Laterality Date  . AMPUTATION  Left 11/18/2014   Procedure: LEFT BELOW KNEE AMPUTATION;  Surgeon: Leandrew Koyanagi, MD;  Location: [facility];  Service: Orthopedics;  Laterality: Left;  . CAST APPLICATION Left 08/05/4625   Procedure: CAST APPLICATION (FIBERGLASS);  Surgeon: Latanya Maudlin, MD;  Location: [facility];  Service: Orthopedics;  Laterality: Left;  CLOSED REDUCTION OF LEFT COLLES FRACTURE WITH SHORT ARM CAST  . HARDWARE REMOVAL Left 05/05/2014   Procedure: HARDWARE REMOVAL LEFT HIP;  Surgeon: Latanya Maudlin, MD;  Location: [facility];  Service: Orthopedics;  Laterality: Left;  REMOVAL BIOMET 6.5-8.0 CANNULATED SCREW  . HIP ARTHROPLASTY Left 05/05/2014   Procedure: ARTHROPLASTY OPEN REDUCTION INTERNAL FIXATION LEFT HIP AND REMOVAL OF TWO CANNULATED SCREW;  Surgeon: Latanya Maudlin, MD;  Location: [facility];  Service: Orthopedics;  Laterality: Left;  . HIP PINNING,CANNULATED  07/16/2011   Procedure: CANNULATED HIP PINNING;  Surgeon: Gearlean Alf, MD;  Location: [facility];  Service: Orthopedics;  Laterality: Left;  . HIP PINNING,CANNULATED Left 04/09/2012   Procedure: CANNULATED HIP PINNING AND HARDWARE REVISION;  Surgeon: Gearlean Alf, MD;  Location: [facility];  Service: Orthopedics;  Laterality: Left;  . I&D EXTREMITY Left 11/16/2014   Procedure:  DEBRIDEMENT OF LEFT FOOT POSSIBLE APPLICATION OF INTEGRIA AND VAC ;  Surgeon: Irene Limbo, MD;  Location: [facility];  Service: Plastics;  Laterality: Left;  . PERIPHERAL VASCULAR CATHETERIZATION N/A 11/15/2014   Procedure: Abdominal Aortogram;  Surgeon: Conrad Ventura, MD;  Location: [facility];  Service: Cardiovascular;  Laterality: N/A;  . PERIPHERAL  VASCULAR CATHETERIZATION  11/15/2014   Procedure: Lower Extremity Angiography;  Surgeon: Conrad Progreso, MD;  Location: [facility];  Service: Cardiovascular;;  . PERIPHERAL VASCULAR CATHETERIZATION Left 11/15/2014   Procedure: Peripheral Vascular Intervention;  Surgeon: Conrad Hamilton, MD;  Location: [facility];  Service: Cardiovascular;  Laterality: Left;  popliteal artery stenting     Social History:  The patient  reports that she has been smoking E-cigarettes and Cigarettes.  She has a 17.50 pack-year smoking history. She has never used smokeless tobacco. She reports that she does not drink alcohol or use drugs.   Family History:  The patient's family history includes Diabetes in her mother; Heart disease in her mother; Hypertension in her father and mother; Vision loss in her mother.    ROS:  Please see the history of present illness. All other systems are reviewed and  Negative to the above problem except as noted.    PHYSICAL EXAM: VS:  BP 128/72   Pulse (!) 54   Ht _0  (1.778 m)   Wt 107 kg (236 lb)   LMP 11/16/2010   BMI 33.86 kg/m   GEN: Well nourished, well developed, in no acute distress  HEENT: normal  Neck: no JVD, carotid bruits, or masses Cardiac: RRR; no murmurs, rubs, or gallops,Tr edema   S/p L BKA Respiratory:  clear to auscultation bilaterally, normal work of breathing GI: soft, nontender, nondistended, + BS  No hepatomegaly  MS: no deformity Moving all extremities   Skin: warm and dry, no rash Neuro:  Strength and sensation are intact Psych: euthymic mood, full affect   EKG:  EKG is not ordered today.   Lipid Panel    Component Value Date/Time   CHOL 139 06/12/2016 1638   TRIG 154 (H) 06/12/2016 1638   HDL 49 (L) 06/12/2016 1638   CHOLHDL 2.8 06/12/2016 1638   VLDL 31 (H) 06/12/2016 1638   LDLCALC 59 06/12/2016 1638  Wt Readings from Last 3 Encounters:  06/29/16 107 kg (236 lb)  06/25/16 92.3 kg (203 lb 6.4 oz)  06/18/16 107  kg (236 lb)      ASSESSMENT AND PLAN:  1  Chronic diastolic CHF  Volume status does not look bad   We discussed salt intake   Keep on same meds    2  CKD    Cr 1.4 to 1.7  Follow    3  HTN  BP is ok on current regimen  4  HL Continue Crestor       Current medicines are reviewed at length with the patient today.  The patient does not have concerns regarding medicines.  Signed, Dorris Carnes, MD  06/29/2016 2:09 PM    [facility], [place], [place]  [address] Phone: [phone]; Fax: [phone]

## 2016-06-29 NOTE — Patient Instructions (Signed)
Medication Instructions:  The current medical regimen is effective;  continue present plan and medications.  Follow-Up: Follow up in 6 weeks with Dr Harrington Challenger.  If you need a refill on your cardiac medications before your next appointment, please call your pharmacy.  Thank you for choosing [facility]!!

## 2016-07-03 ENCOUNTER — Other Ambulatory Visit: Payer: Self-pay

## 2016-07-03 ENCOUNTER — Telehealth: Payer: Self-pay | Admitting: Physician Assistant

## 2016-07-03 MED ORDER — ACCU-CHEK AVIVA PLUS W/DEVICE KIT: PACK | 1 refills | Status: DC

## 2016-07-03 MED ORDER — GLUCOSE BLOOD VI STRP: ORAL_STRIP | 4 refills | Status: DC

## 2016-07-03 NOTE — Telephone Encounter (Signed)
Please advise which Accu Check meter to provide or prescribe for this patient.

## 2016-07-03 NOTE — Telephone Encounter (Signed)
Patient does not have a way to test blood sugar at this time. Her insurance will not cover one touch meter so she needs Accu Check meter ordered instead.  She wants to know if there is an Accu Check in the office she can use?  Needs Accu check strips as well as meter.  [id number] - [place], [place]  Thank you,  -LL

## 2016-07-03 NOTE — Telephone Encounter (Signed)
She can use an Accu-Chek Aviva plus

## 2016-07-03 NOTE — Telephone Encounter (Signed)
PA no longer needed meter and test strips ordered and patient has been notified

## 2016-07-05 DIAGNOSIS — E1142 Type 2 diabetes mellitus with diabetic polyneuropathy: Secondary | ICD-10-CM | POA: Diagnosis not present

## 2016-07-05 DIAGNOSIS — B20 Human immunodeficiency virus [HIV] disease: Secondary | ICD-10-CM | POA: Diagnosis not present

## 2016-07-05 DIAGNOSIS — J449 Chronic obstructive pulmonary disease, unspecified: Secondary | ICD-10-CM | POA: Diagnosis not present

## 2016-07-05 DIAGNOSIS — I252 Old myocardial infarction: Secondary | ICD-10-CM | POA: Diagnosis not present

## 2016-07-05 DIAGNOSIS — J45901 Unspecified asthma with (acute) exacerbation: Secondary | ICD-10-CM | POA: Diagnosis not present

## 2016-07-05 DIAGNOSIS — I1310 Hypertensive heart and chronic kidney disease without heart failure, with stage 1 through stage 4 chronic kidney disease, or unspecified chronic kidney disease: Secondary | ICD-10-CM | POA: Diagnosis not present

## 2016-07-05 DIAGNOSIS — E1122 Type 2 diabetes mellitus with diabetic chronic kidney disease: Secondary | ICD-10-CM | POA: Diagnosis not present

## 2016-07-05 DIAGNOSIS — N184 Chronic kidney disease, stage 4 (severe): Secondary | ICD-10-CM | POA: Diagnosis not present

## 2016-07-05 DIAGNOSIS — J9621 Acute and chronic respiratory failure with hypoxia: Secondary | ICD-10-CM | POA: Diagnosis not present

## 2016-07-06 DIAGNOSIS — J449 Chronic obstructive pulmonary disease, unspecified: Secondary | ICD-10-CM | POA: Diagnosis not present

## 2016-07-06 DIAGNOSIS — F314 Bipolar disorder, current episode depressed, severe, without psychotic features: Secondary | ICD-10-CM | POA: Diagnosis not present

## 2016-07-06 DIAGNOSIS — B20 Human immunodeficiency virus [HIV] disease: Secondary | ICD-10-CM | POA: Diagnosis not present

## 2016-07-06 DIAGNOSIS — E1142 Type 2 diabetes mellitus with diabetic polyneuropathy: Secondary | ICD-10-CM | POA: Diagnosis not present

## 2016-07-06 DIAGNOSIS — E1122 Type 2 diabetes mellitus with diabetic chronic kidney disease: Secondary | ICD-10-CM | POA: Diagnosis not present

## 2016-07-06 DIAGNOSIS — I252 Old myocardial infarction: Secondary | ICD-10-CM | POA: Diagnosis not present

## 2016-07-06 DIAGNOSIS — F411 Generalized anxiety disorder: Secondary | ICD-10-CM | POA: Diagnosis not present

## 2016-07-06 DIAGNOSIS — I1310 Hypertensive heart and chronic kidney disease without heart failure, with stage 1 through stage 4 chronic kidney disease, or unspecified chronic kidney disease: Secondary | ICD-10-CM | POA: Diagnosis not present

## 2016-07-06 DIAGNOSIS — J9621 Acute and chronic respiratory failure with hypoxia: Secondary | ICD-10-CM | POA: Diagnosis not present

## 2016-07-06 DIAGNOSIS — J45901 Unspecified asthma with (acute) exacerbation: Secondary | ICD-10-CM | POA: Diagnosis not present

## 2016-07-06 DIAGNOSIS — N184 Chronic kidney disease, stage 4 (severe): Secondary | ICD-10-CM | POA: Diagnosis not present

## 2016-07-09 ENCOUNTER — Telehealth: Payer: Self-pay | Admitting: *Deleted

## 2016-07-09 DIAGNOSIS — G546 Phantom limb syndrome with pain: Secondary | ICD-10-CM | POA: Diagnosis not present

## 2016-07-09 DIAGNOSIS — G8929 Other chronic pain: Secondary | ICD-10-CM | POA: Diagnosis not present

## 2016-07-09 DIAGNOSIS — M5416 Radiculopathy, lumbar region: Secondary | ICD-10-CM | POA: Diagnosis not present

## 2016-07-09 NOTE — Telephone Encounter (Signed)
Patient called stating that her pain clinic is closing and she has been taking hydrocodone 325 mg for over a year now. She was hoping that Dr. Johnnye Sima would prescribe this Rx until she gets in with the new pain clinic. Advised that she should talk to her PCP and she said she did and they will not prescribe narcotics. Explained that this is something that her prescribing MD should handle and she said there is a new clinic opening, however they have told her it will be two weeks before she gets in and 2-3 months before she sees and MD. I advised her that Dr. Johnnye Sima is away from clinic this week and that the clinic is not prescribing any new pain contracts. She said, "ok I figured it was worth a try".

## 2016-07-10 DIAGNOSIS — I252 Old myocardial infarction: Secondary | ICD-10-CM | POA: Diagnosis not present

## 2016-07-10 DIAGNOSIS — J449 Chronic obstructive pulmonary disease, unspecified: Secondary | ICD-10-CM | POA: Diagnosis not present

## 2016-07-10 DIAGNOSIS — N184 Chronic kidney disease, stage 4 (severe): Secondary | ICD-10-CM | POA: Diagnosis not present

## 2016-07-10 DIAGNOSIS — J9621 Acute and chronic respiratory failure with hypoxia: Secondary | ICD-10-CM | POA: Diagnosis not present

## 2016-07-10 DIAGNOSIS — J45901 Unspecified asthma with (acute) exacerbation: Secondary | ICD-10-CM | POA: Diagnosis not present

## 2016-07-10 DIAGNOSIS — E1142 Type 2 diabetes mellitus with diabetic polyneuropathy: Secondary | ICD-10-CM | POA: Diagnosis not present

## 2016-07-10 DIAGNOSIS — B20 Human immunodeficiency virus [HIV] disease: Secondary | ICD-10-CM | POA: Diagnosis not present

## 2016-07-10 DIAGNOSIS — I1310 Hypertensive heart and chronic kidney disease without heart failure, with stage 1 through stage 4 chronic kidney disease, or unspecified chronic kidney disease: Secondary | ICD-10-CM | POA: Diagnosis not present

## 2016-07-10 DIAGNOSIS — E1122 Type 2 diabetes mellitus with diabetic chronic kidney disease: Secondary | ICD-10-CM | POA: Diagnosis not present

## 2016-07-11 DIAGNOSIS — F411 Generalized anxiety disorder: Secondary | ICD-10-CM | POA: Diagnosis not present

## 2016-07-11 DIAGNOSIS — F314 Bipolar disorder, current episode depressed, severe, without psychotic features: Secondary | ICD-10-CM | POA: Diagnosis not present

## 2016-07-17 DIAGNOSIS — I252 Old myocardial infarction: Secondary | ICD-10-CM | POA: Diagnosis not present

## 2016-07-17 DIAGNOSIS — I1310 Hypertensive heart and chronic kidney disease without heart failure, with stage 1 through stage 4 chronic kidney disease, or unspecified chronic kidney disease: Secondary | ICD-10-CM | POA: Diagnosis not present

## 2016-07-17 DIAGNOSIS — E1122 Type 2 diabetes mellitus with diabetic chronic kidney disease: Secondary | ICD-10-CM | POA: Diagnosis not present

## 2016-07-17 DIAGNOSIS — N184 Chronic kidney disease, stage 4 (severe): Secondary | ICD-10-CM | POA: Diagnosis not present

## 2016-07-17 DIAGNOSIS — J449 Chronic obstructive pulmonary disease, unspecified: Secondary | ICD-10-CM | POA: Diagnosis not present

## 2016-07-17 DIAGNOSIS — J45901 Unspecified asthma with (acute) exacerbation: Secondary | ICD-10-CM | POA: Diagnosis not present

## 2016-07-17 DIAGNOSIS — J9621 Acute and chronic respiratory failure with hypoxia: Secondary | ICD-10-CM | POA: Diagnosis not present

## 2016-07-17 DIAGNOSIS — B20 Human immunodeficiency virus [HIV] disease: Secondary | ICD-10-CM | POA: Diagnosis not present

## 2016-07-17 DIAGNOSIS — E1142 Type 2 diabetes mellitus with diabetic polyneuropathy: Secondary | ICD-10-CM | POA: Diagnosis not present

## 2016-07-19 DIAGNOSIS — I252 Old myocardial infarction: Secondary | ICD-10-CM | POA: Diagnosis not present

## 2016-07-19 DIAGNOSIS — E1122 Type 2 diabetes mellitus with diabetic chronic kidney disease: Secondary | ICD-10-CM | POA: Diagnosis not present

## 2016-07-19 DIAGNOSIS — E1142 Type 2 diabetes mellitus with diabetic polyneuropathy: Secondary | ICD-10-CM | POA: Diagnosis not present

## 2016-07-19 DIAGNOSIS — J449 Chronic obstructive pulmonary disease, unspecified: Secondary | ICD-10-CM | POA: Diagnosis not present

## 2016-07-19 DIAGNOSIS — J45901 Unspecified asthma with (acute) exacerbation: Secondary | ICD-10-CM | POA: Diagnosis not present

## 2016-07-19 DIAGNOSIS — N184 Chronic kidney disease, stage 4 (severe): Secondary | ICD-10-CM | POA: Diagnosis not present

## 2016-07-19 DIAGNOSIS — B20 Human immunodeficiency virus [HIV] disease: Secondary | ICD-10-CM | POA: Diagnosis not present

## 2016-07-19 DIAGNOSIS — J9621 Acute and chronic respiratory failure with hypoxia: Secondary | ICD-10-CM | POA: Diagnosis not present

## 2016-07-19 DIAGNOSIS — I1310 Hypertensive heart and chronic kidney disease without heart failure, with stage 1 through stage 4 chronic kidney disease, or unspecified chronic kidney disease: Secondary | ICD-10-CM | POA: Diagnosis not present

## 2016-07-24 DIAGNOSIS — I252 Old myocardial infarction: Secondary | ICD-10-CM | POA: Diagnosis not present

## 2016-07-24 DIAGNOSIS — E1142 Type 2 diabetes mellitus with diabetic polyneuropathy: Secondary | ICD-10-CM | POA: Diagnosis not present

## 2016-07-24 DIAGNOSIS — J449 Chronic obstructive pulmonary disease, unspecified: Secondary | ICD-10-CM | POA: Diagnosis not present

## 2016-07-24 DIAGNOSIS — N184 Chronic kidney disease, stage 4 (severe): Secondary | ICD-10-CM | POA: Diagnosis not present

## 2016-07-24 DIAGNOSIS — I1310 Hypertensive heart and chronic kidney disease without heart failure, with stage 1 through stage 4 chronic kidney disease, or unspecified chronic kidney disease: Secondary | ICD-10-CM | POA: Diagnosis not present

## 2016-07-24 DIAGNOSIS — E1122 Type 2 diabetes mellitus with diabetic chronic kidney disease: Secondary | ICD-10-CM | POA: Diagnosis not present

## 2016-07-24 DIAGNOSIS — B20 Human immunodeficiency virus [HIV] disease: Secondary | ICD-10-CM | POA: Diagnosis not present

## 2016-07-24 DIAGNOSIS — J9621 Acute and chronic respiratory failure with hypoxia: Secondary | ICD-10-CM | POA: Diagnosis not present

## 2016-07-24 DIAGNOSIS — J45901 Unspecified asthma with (acute) exacerbation: Secondary | ICD-10-CM | POA: Diagnosis not present

## 2016-07-27 DIAGNOSIS — M25552 Pain in left hip: Secondary | ICD-10-CM | POA: Diagnosis not present

## 2016-07-27 DIAGNOSIS — Z79899 Other long term (current) drug therapy: Secondary | ICD-10-CM | POA: Diagnosis not present

## 2016-07-27 DIAGNOSIS — M545 Low back pain: Secondary | ICD-10-CM | POA: Diagnosis not present

## 2016-07-27 DIAGNOSIS — G8929 Other chronic pain: Secondary | ICD-10-CM | POA: Diagnosis not present

## 2016-07-28 ENCOUNTER — Other Ambulatory Visit: Payer: Self-pay | Admitting: Endocrinology

## 2016-07-31 DIAGNOSIS — J9621 Acute and chronic respiratory failure with hypoxia: Secondary | ICD-10-CM | POA: Diagnosis not present

## 2016-07-31 DIAGNOSIS — J449 Chronic obstructive pulmonary disease, unspecified: Secondary | ICD-10-CM | POA: Diagnosis not present

## 2016-07-31 DIAGNOSIS — I252 Old myocardial infarction: Secondary | ICD-10-CM | POA: Diagnosis not present

## 2016-07-31 DIAGNOSIS — I1310 Hypertensive heart and chronic kidney disease without heart failure, with stage 1 through stage 4 chronic kidney disease, or unspecified chronic kidney disease: Secondary | ICD-10-CM | POA: Diagnosis not present

## 2016-07-31 DIAGNOSIS — E1142 Type 2 diabetes mellitus with diabetic polyneuropathy: Secondary | ICD-10-CM | POA: Diagnosis not present

## 2016-07-31 DIAGNOSIS — J45901 Unspecified asthma with (acute) exacerbation: Secondary | ICD-10-CM | POA: Diagnosis not present

## 2016-07-31 DIAGNOSIS — N184 Chronic kidney disease, stage 4 (severe): Secondary | ICD-10-CM | POA: Diagnosis not present

## 2016-07-31 DIAGNOSIS — E1122 Type 2 diabetes mellitus with diabetic chronic kidney disease: Secondary | ICD-10-CM | POA: Diagnosis not present

## 2016-07-31 DIAGNOSIS — B20 Human immunodeficiency virus [HIV] disease: Secondary | ICD-10-CM | POA: Diagnosis not present

## 2016-08-02 ENCOUNTER — Institutional Professional Consult (permissible substitution): Payer: Medicare HMO | Admitting: Internal Medicine

## 2016-08-03 DIAGNOSIS — F411 Generalized anxiety disorder: Secondary | ICD-10-CM | POA: Diagnosis not present

## 2016-08-03 DIAGNOSIS — F314 Bipolar disorder, current episode depressed, severe, without psychotic features: Secondary | ICD-10-CM | POA: Diagnosis not present

## 2016-08-06 ENCOUNTER — Institutional Professional Consult (permissible substitution): Payer: Medicare HMO | Admitting: Internal Medicine

## 2016-08-16 DIAGNOSIS — Z79899 Other long term (current) drug therapy: Secondary | ICD-10-CM | POA: Diagnosis not present

## 2016-08-16 DIAGNOSIS — Z5181 Encounter for therapeutic drug level monitoring: Secondary | ICD-10-CM | POA: Diagnosis not present

## 2016-08-19 ENCOUNTER — Emergency Department (HOSPITAL_COMMUNITY): Payer: Medicare HMO

## 2016-08-19 ENCOUNTER — Encounter (HOSPITAL_COMMUNITY): Payer: Self-pay | Admitting: Emergency Medicine

## 2016-08-19 ENCOUNTER — Observation Stay (HOSPITAL_COMMUNITY)
Admission: EM | Admit: 2016-08-19 | Discharge: 2016-08-21 | Disposition: A | Discharge status: Home / Self Care | Payer: Medicare HMO | Attending: Internal Medicine | Admitting: Internal Medicine

## 2016-08-19 ENCOUNTER — Inpatient Hospital Stay (HOSPITAL_COMMUNITY)
Admission: EM | Admit: 2016-08-19 | Discharge: 2016-08-19 | DRG: 189 | Discharge status: Against Medical Advice | Payer: Medicare HMO | Attending: Nephrology | Admitting: Nephrology

## 2016-08-19 ENCOUNTER — Observation Stay (HOSPITAL_COMMUNITY): Payer: Medicare HMO

## 2016-08-19 DIAGNOSIS — E872 Acidosis: Secondary | ICD-10-CM | POA: Diagnosis not present

## 2016-08-19 DIAGNOSIS — R739 Hyperglycemia, unspecified: Secondary | ICD-10-CM

## 2016-08-19 DIAGNOSIS — N183 Chronic kidney disease, stage 3 unspecified: Secondary | ICD-10-CM | POA: Diagnosis present

## 2016-08-19 DIAGNOSIS — J4551 Severe persistent asthma with (acute) exacerbation: Secondary | ICD-10-CM

## 2016-08-19 DIAGNOSIS — E1165 Type 2 diabetes mellitus with hyperglycemia: Secondary | ICD-10-CM | POA: Insufficient documentation

## 2016-08-19 DIAGNOSIS — B20 Human immunodeficiency virus [HIV] disease: Secondary | ICD-10-CM | POA: Diagnosis not present

## 2016-08-19 DIAGNOSIS — R0602 Shortness of breath: Secondary | ICD-10-CM | POA: Diagnosis not present

## 2016-08-19 DIAGNOSIS — I129 Hypertensive chronic kidney disease with stage 1 through stage 4 chronic kidney disease, or unspecified chronic kidney disease: Secondary | ICD-10-CM | POA: Diagnosis not present

## 2016-08-19 DIAGNOSIS — J4541 Moderate persistent asthma with (acute) exacerbation: Secondary | ICD-10-CM

## 2016-08-19 DIAGNOSIS — Z888 Allergy status to other drugs, medicaments and biological substances status: Secondary | ICD-10-CM | POA: Diagnosis not present

## 2016-08-19 DIAGNOSIS — E1110 Type 2 diabetes mellitus with ketoacidosis without coma: Secondary | ICD-10-CM | POA: Insufficient documentation

## 2016-08-19 DIAGNOSIS — Z89512 Acquired absence of left leg below knee: Secondary | ICD-10-CM

## 2016-08-19 DIAGNOSIS — I5189 Other ill-defined heart diseases: Secondary | ICD-10-CM | POA: Diagnosis present

## 2016-08-19 DIAGNOSIS — E8729 Other acidosis: Secondary | ICD-10-CM

## 2016-08-19 DIAGNOSIS — Z6835 Body mass index (BMI) 35.0-35.9, adult: Secondary | ICD-10-CM

## 2016-08-19 DIAGNOSIS — R400 Somnolence: Secondary | ICD-10-CM

## 2016-08-19 DIAGNOSIS — J45901 Unspecified asthma with (acute) exacerbation: Secondary | ICD-10-CM | POA: Diagnosis present

## 2016-08-19 DIAGNOSIS — G8929 Other chronic pain: Secondary | ICD-10-CM | POA: Diagnosis not present

## 2016-08-19 DIAGNOSIS — Z9981 Dependence on supplemental oxygen: Secondary | ICD-10-CM

## 2016-08-19 DIAGNOSIS — I5032 Chronic diastolic (congestive) heart failure: Secondary | ICD-10-CM | POA: Diagnosis present

## 2016-08-19 DIAGNOSIS — D72829 Elevated white blood cell count, unspecified: Secondary | ICD-10-CM | POA: Diagnosis not present

## 2016-08-19 DIAGNOSIS — I252 Old myocardial infarction: Secondary | ICD-10-CM

## 2016-08-19 DIAGNOSIS — E871 Hypo-osmolality and hyponatremia: Secondary | ICD-10-CM | POA: Diagnosis present

## 2016-08-19 DIAGNOSIS — G4700 Insomnia, unspecified: Secondary | ICD-10-CM | POA: Diagnosis not present

## 2016-08-19 DIAGNOSIS — R000 Tachycardia, unspecified: Secondary | ICD-10-CM | POA: Insufficient documentation

## 2016-08-19 DIAGNOSIS — E662 Morbid (severe) obesity with alveolar hypoventilation: Secondary | ICD-10-CM | POA: Diagnosis not present

## 2016-08-19 DIAGNOSIS — I2510 Atherosclerotic heart disease of native coronary artery without angina pectoris: Secondary | ICD-10-CM | POA: Diagnosis not present

## 2016-08-19 DIAGNOSIS — E1142 Type 2 diabetes mellitus with diabetic polyneuropathy: Secondary | ICD-10-CM | POA: Insufficient documentation

## 2016-08-19 DIAGNOSIS — M1611 Unilateral primary osteoarthritis, right hip: Secondary | ICD-10-CM | POA: Diagnosis present

## 2016-08-19 DIAGNOSIS — IMO0002 Reserved for concepts with insufficient information to code with codable children: Secondary | ICD-10-CM | POA: Diagnosis present

## 2016-08-19 DIAGNOSIS — F419 Anxiety disorder, unspecified: Secondary | ICD-10-CM | POA: Diagnosis not present

## 2016-08-19 DIAGNOSIS — N179 Acute kidney failure, unspecified: Secondary | ICD-10-CM | POA: Diagnosis not present

## 2016-08-19 DIAGNOSIS — J9622 Acute and chronic respiratory failure with hypercapnia: Secondary | ICD-10-CM

## 2016-08-19 DIAGNOSIS — Z7982 Long term (current) use of aspirin: Secondary | ICD-10-CM | POA: Insufficient documentation

## 2016-08-19 DIAGNOSIS — K219 Gastro-esophageal reflux disease without esophagitis: Secondary | ICD-10-CM | POA: Insufficient documentation

## 2016-08-19 DIAGNOSIS — F17210 Nicotine dependence, cigarettes, uncomplicated: Secondary | ICD-10-CM | POA: Diagnosis not present

## 2016-08-19 DIAGNOSIS — N184 Chronic kidney disease, stage 4 (severe): Secondary | ICD-10-CM | POA: Diagnosis not present

## 2016-08-19 DIAGNOSIS — E1140 Type 2 diabetes mellitus with diabetic neuropathy, unspecified: Secondary | ICD-10-CM | POA: Diagnosis present

## 2016-08-19 DIAGNOSIS — E118 Type 2 diabetes mellitus with unspecified complications: Secondary | ICD-10-CM

## 2016-08-19 DIAGNOSIS — R4182 Altered mental status, unspecified: Secondary | ICD-10-CM | POA: Diagnosis present

## 2016-08-19 DIAGNOSIS — M549 Dorsalgia, unspecified: Secondary | ICD-10-CM | POA: Insufficient documentation

## 2016-08-19 DIAGNOSIS — J441 Chronic obstructive pulmonary disease with (acute) exacerbation: Secondary | ICD-10-CM | POA: Diagnosis present

## 2016-08-19 DIAGNOSIS — F17290 Nicotine dependence, other tobacco product, uncomplicated: Secondary | ICD-10-CM | POA: Diagnosis present

## 2016-08-19 DIAGNOSIS — I130 Hypertensive heart and chronic kidney disease with heart failure and stage 1 through stage 4 chronic kidney disease, or unspecified chronic kidney disease: Secondary | ICD-10-CM | POA: Diagnosis present

## 2016-08-19 DIAGNOSIS — N289 Disorder of kidney and ureter, unspecified: Secondary | ICD-10-CM

## 2016-08-19 DIAGNOSIS — E1122 Type 2 diabetes mellitus with diabetic chronic kidney disease: Secondary | ICD-10-CM | POA: Insufficient documentation

## 2016-08-19 DIAGNOSIS — Z794 Long term (current) use of insulin: Secondary | ICD-10-CM

## 2016-08-19 DIAGNOSIS — Z7951 Long term (current) use of inhaled steroids: Secondary | ICD-10-CM

## 2016-08-19 DIAGNOSIS — I519 Heart disease, unspecified: Secondary | ICD-10-CM

## 2016-08-19 DIAGNOSIS — Z79899 Other long term (current) drug therapy: Secondary | ICD-10-CM | POA: Insufficient documentation

## 2016-08-19 DIAGNOSIS — Z6841 Body Mass Index (BMI) 40.0 and over, adult: Secondary | ICD-10-CM | POA: Insufficient documentation

## 2016-08-19 DIAGNOSIS — J9621 Acute and chronic respiratory failure with hypoxia: Secondary | ICD-10-CM | POA: Diagnosis not present

## 2016-08-19 DIAGNOSIS — J9612 Chronic respiratory failure with hypercapnia: Secondary | ICD-10-CM | POA: Diagnosis not present

## 2016-08-19 DIAGNOSIS — Z21 Asymptomatic human immunodeficiency virus [HIV] infection status: Secondary | ICD-10-CM | POA: Diagnosis not present

## 2016-08-19 DIAGNOSIS — E1169 Type 2 diabetes mellitus with other specified complication: Secondary | ICD-10-CM | POA: Diagnosis not present

## 2016-08-19 DIAGNOSIS — R069 Unspecified abnormalities of breathing: Secondary | ICD-10-CM | POA: Diagnosis not present

## 2016-08-19 DIAGNOSIS — E785 Hyperlipidemia, unspecified: Secondary | ICD-10-CM | POA: Diagnosis not present

## 2016-08-19 DIAGNOSIS — R7309 Other abnormal glucose: Secondary | ICD-10-CM

## 2016-08-19 DIAGNOSIS — I959 Hypotension, unspecified: Secondary | ICD-10-CM | POA: Diagnosis not present

## 2016-08-19 LAB — CBC WITH DIFFERENTIAL/PLATELET
Basophils Absolute: 0 10*3/uL (ref 0.0–0.1)
Basophils Absolute: 0 10*3/uL (ref 0.0–0.1)
Basophils Relative: 0 %
Basophils Relative: 0 %
EOS ABS: 0 10*3/uL (ref 0.0–0.7)
EOS PCT: 0 %
Eosinophils Absolute: 0.3 10*3/uL (ref 0.0–0.7)
Eosinophils Relative: 2 %
HCT: 43.9 % (ref 36.0–46.0)
HCT: 39 % (ref 36.0–46.0)
HEMOGLOBIN: 13.3 g/dL (ref 12.0–15.0)
Hemoglobin: 12.2 g/dL (ref 12.0–15.0)
LYMPHS ABS: 2.2 10*3/uL (ref 0.7–4.0)
LYMPHS ABS: 0.6 10*3/uL — AB (ref 0.7–4.0)
LYMPHS PCT: 16 %
LYMPHS PCT: 5 %
MCH: 27 pg (ref 26.0–34.0)
MCH: 26.9 pg (ref 26.0–34.0)
MCHC: 30.3 g/dL (ref 30.0–36.0)
MCHC: 31.3 g/dL (ref 30.0–36.0)
MCV: 89 fL (ref 78.0–100.0)
MCV: 85.9 fL (ref 78.0–100.0)
MONO ABS: 0.3 10*3/uL (ref 0.1–1.0)
MONOS PCT: 6 %
Monocytes Absolute: 0.8 10*3/uL (ref 0.1–1.0)
Monocytes Relative: 3 %
NEUTROS PCT: 77 %
Neutro Abs: 10.5 10*3/uL — ABNORMAL HIGH (ref 1.7–7.7)
Neutro Abs: 9.8 10*3/uL — ABNORMAL HIGH (ref 1.7–7.7)
Neutrophils Relative %: 92 %
PLATELETS: 273 10*3/uL (ref 150–400)
Platelets: 231 10*3/uL (ref 150–400)
RBC: 4.93 MIL/uL (ref 3.87–5.11)
RBC: 4.54 MIL/uL (ref 3.87–5.11)
RDW: 20.8 % — ABNORMAL HIGH (ref 11.5–15.5)
RDW: 20.6 % — AB (ref 11.5–15.5)
WBC: 13.8 10*3/uL — AB (ref 4.0–10.5)
WBC: 10.7 10*3/uL — AB (ref 4.0–10.5)

## 2016-08-19 LAB — RAPID URINE DRUG SCREEN, HOSP PERFORMED
Amphetamines: NOT DETECTED
BARBITURATES: NOT DETECTED
BENZODIAZEPINES: POSITIVE — AB
COCAINE: NOT DETECTED
OPIATES: POSITIVE — AB
Tetrahydrocannabinol: NOT DETECTED

## 2016-08-19 LAB — COMPREHENSIVE METABOLIC PANEL
ALBUMIN: 3.1 g/dL — AB (ref 3.5–5.0)
ALK PHOS: 62 U/L (ref 38–126)
ALT: 14 U/L (ref 14–54)
AST: 18 U/L (ref 15–41)
Anion gap: 13 (ref 5–15)
BUN: 20 mg/dL (ref 6–20)
CALCIUM: 9.6 mg/dL (ref 8.9–10.3)
CO2: 26 mmol/L (ref 22–32)
CREATININE: 1.59 mg/dL — AB (ref 0.44–1.00)
Chloride: 95 mmol/L — ABNORMAL LOW (ref 101–111)
GFR, EST AFRICAN AMERICAN: 42 mL/min — AB (ref >60)
GFR, EST NON AFRICAN AMERICAN: 36 mL/min — AB (ref >60)
Glucose, Bld: 504 mg/dL (ref 65–99)
Potassium: 4.6 mmol/L (ref 3.5–5.1)
SODIUM: 134 mmol/L — AB (ref 135–145)
Total Bilirubin: 0.9 mg/dL (ref 0.3–1.2)
Total Protein: 7.3 g/dL (ref 6.5–8.1)

## 2016-08-19 LAB — URINALYSIS, ROUTINE W REFLEX MICROSCOPIC
BILIRUBIN URINE: NEGATIVE
Glucose, UA: >=500 mg/dL — AB
Hgb urine dipstick: NEGATIVE
KETONES UR: 20 mg/dL — AB
LEUKOCYTES UA: NEGATIVE
Nitrite: NEGATIVE
PH: 6 (ref 5.0–8.0)
PROTEIN: NEGATIVE mg/dL
Specific Gravity, Urine: 1.029 (ref 1.005–1.030)

## 2016-08-19 LAB — CBG MONITORING, ED
GLUCOSE-CAPILLARY: 502 mg/dL — AB (ref 65–99)
GLUCOSE-CAPILLARY: 448 mg/dL — AB (ref 65–99)
Glucose-Capillary: 275 mg/dL — ABNORMAL HIGH (ref 65–99)
Glucose-Capillary: 461 mg/dL — ABNORMAL HIGH (ref 65–99)

## 2016-08-19 LAB — I-STAT VENOUS BLOOD GAS, ED
ACID-BASE EXCESS: 10 mmol/L — AB (ref 0.0–2.0)
BICARBONATE: 31.3 mmol/L — AB (ref 20.0–28.0)
O2 SAT: 98 %
PCO2 VEN: 31.8 mmHg — AB (ref 44.0–60.0)
PO2 VEN: 92 mmHg — AB (ref 32.0–45.0)
TCO2: 32 mmol/L (ref 0–100)
pH, Ven: 7.601 (ref 7.250–7.430)

## 2016-08-19 LAB — GLUCOSE, CAPILLARY: Glucose-Capillary: 484 mg/dL — ABNORMAL HIGH (ref 65–99)

## 2016-08-19 LAB — I-STAT TROPONIN, ED
TROPONIN I, POC: 0 ng/mL (ref 0.00–0.08)
Troponin i, poc: 0 ng/mL (ref 0.00–0.08)

## 2016-08-19 LAB — I-STAT ARTERIAL BLOOD GAS, ED
ACID-BASE EXCESS: 7 mmol/L — AB (ref 0.0–2.0)
Bicarbonate: 34.6 mmol/L — ABNORMAL HIGH (ref 20.0–28.0)
O2 Saturation: 95 %
PCO2 ART: 66.4 mmHg — AB (ref 32.0–48.0)
PO2 ART: 85 mmHg (ref 83.0–108.0)
Patient temperature: 99.8
TCO2: 37 mmol/L (ref 0–100)
pH, Arterial: 7.329 — ABNORMAL LOW (ref 7.350–7.450)

## 2016-08-19 LAB — I-STAT CG4 LACTIC ACID, ED: LACTIC ACID, VENOUS: 2.11 mmol/L — AB (ref 0.5–1.9)

## 2016-08-19 LAB — BASIC METABOLIC PANEL
Anion gap: 10 (ref 5–15)
BUN: 15 mg/dL (ref 6–20)
CHLORIDE: 99 mmol/L — AB (ref 101–111)
CO2: 32 mmol/L (ref 22–32)
CREATININE: 1.37 mg/dL — AB (ref 0.44–1.00)
Calcium: 9.3 mg/dL (ref 8.9–10.3)
GFR calc Af Amer: 50 mL/min — ABNORMAL LOW (ref >60)
GFR calc non Af Amer: 43 mL/min — ABNORMAL LOW (ref >60)
GLUCOSE: 263 mg/dL — AB (ref 65–99)
POTASSIUM: 4.4 mmol/L (ref 3.5–5.1)
Sodium: 141 mmol/L (ref 135–145)

## 2016-08-19 LAB — AMMONIA: Ammonia: 30 umol/L (ref 9–35)

## 2016-08-19 LAB — LACTIC ACID, PLASMA: Lactic Acid, Venous: 2.9 mmol/L (ref 0.5–1.9)

## 2016-08-19 LAB — ETHANOL

## 2016-08-19 LAB — MRSA PCR SCREENING: MRSA by PCR: NEGATIVE

## 2016-08-19 LAB — BRAIN NATRIURETIC PEPTIDE: B Natriuretic Peptide: 65.5 pg/mL (ref 0.0–100.0)

## 2016-08-19 MED ORDER — DEXTROSE 5 % IV SOLN
500.0000 mg | INTRAVENOUS | Status: DC
  Administered 2016-08-19 – 2016-08-20 (×2): 500 mg via INTRAVENOUS
  Filled 2016-08-19 (×3): qty 500

## 2016-08-19 MED ORDER — ARFORMOTEROL TARTRATE 15 MCG/2ML IN NEBU
15.0000 ug | INHALATION_SOLUTION | Freq: Two times a day (BID) | RESPIRATORY_TRACT | Status: DC
  Administered 2016-08-19: 15 ug via RESPIRATORY_TRACT
  Filled 2016-08-19: qty 2

## 2016-08-19 MED ORDER — IPRATROPIUM BROMIDE 0.02 % IN SOLN
RESPIRATORY_TRACT | Status: AC
  Filled 2016-08-19: qty 2.5

## 2016-08-19 MED ORDER — ONDANSETRON HCL 4 MG PO TABS: 4.0000 mg | ORAL_TABLET | Freq: Four times a day (QID) | ORAL | Status: DC | PRN

## 2016-08-19 MED ORDER — IPRATROPIUM BROMIDE 0.02 % IN SOLN
1.0000 mg | Freq: Once | RESPIRATORY_TRACT | Status: AC
  Administered 2016-08-19: 1 mg via RESPIRATORY_TRACT
  Filled 2016-08-19: qty 5

## 2016-08-19 MED ORDER — HYDROCODONE-ACETAMINOPHEN 10-325 MG PO TABS: 1.0000 | ORAL_TABLET | Freq: Four times a day (QID) | ORAL | Status: DC | PRN

## 2016-08-19 MED ORDER — ENOXAPARIN SODIUM 40 MG/0.4ML ~~LOC~~ SOLN
40.0000 mg | Freq: Every day | SUBCUTANEOUS | Status: DC
  Administered 2016-08-19: 40 mg via SUBCUTANEOUS
  Filled 2016-08-19: qty 0.4

## 2016-08-19 MED ORDER — IPRATROPIUM-ALBUTEROL 0.5-2.5 (3) MG/3ML IN SOLN: 3.0000 mL | RESPIRATORY_TRACT | Status: DC | PRN

## 2016-08-19 MED ORDER — GUAIFENESIN ER 600 MG PO TB12
600.0000 mg | ORAL_TABLET | Freq: Two times a day (BID) | ORAL | Status: DC
  Administered 2016-08-19: 600 mg via ORAL
  Filled 2016-08-19: qty 1

## 2016-08-19 MED ORDER — ALBUTEROL (5 MG/ML) CONTINUOUS INHALATION SOLN
INHALATION_SOLUTION | RESPIRATORY_TRACT | Status: AC
  Filled 2016-08-19: qty 20

## 2016-08-19 MED ORDER — ONDANSETRON HCL 4 MG/2ML IJ SOLN: 4.0000 mg | Freq: Four times a day (QID) | INTRAMUSCULAR | Status: DC | PRN

## 2016-08-19 MED ORDER — METHYLPREDNISOLONE SODIUM SUCC 125 MG IJ SOLR: 60.0000 mg | Freq: Two times a day (BID) | INTRAMUSCULAR | Status: DC

## 2016-08-19 MED ORDER — INSULIN ASPART 100 UNIT/ML ~~LOC~~ SOLN: 5.0000 [IU] | Freq: Three times a day (TID) | SUBCUTANEOUS | Status: DC

## 2016-08-19 MED ORDER — METHYLPREDNISOLONE SODIUM SUCC 125 MG IJ SOLR
125.0000 mg | Freq: Once | INTRAMUSCULAR | Status: AC
  Administered 2016-08-19: 125 mg via INTRAVENOUS
  Filled 2016-08-19: qty 2

## 2016-08-19 MED ORDER — IPRATROPIUM-ALBUTEROL 0.5-2.5 (3) MG/3ML IN SOLN
3.0000 mL | Freq: Once | RESPIRATORY_TRACT | Status: AC
  Administered 2016-08-19: 3 mL via RESPIRATORY_TRACT
  Filled 2016-08-19: qty 3

## 2016-08-19 MED ORDER — INSULIN ASPART 100 UNIT/ML ~~LOC~~ SOLN
10.0000 [IU] | Freq: Once | SUBCUTANEOUS | Status: AC
  Administered 2016-08-19: 10 [IU] via INTRAVENOUS
  Filled 2016-08-19: qty 1

## 2016-08-19 MED ORDER — INSULIN GLARGINE 100 UNIT/ML ~~LOC~~ SOLN
50.0000 [IU] | Freq: Two times a day (BID) | SUBCUTANEOUS | Status: DC
  Administered 2016-08-19: 50 [IU] via SUBCUTANEOUS
  Filled 2016-08-19: qty 0.5

## 2016-08-19 MED ORDER — BUDESONIDE 0.5 MG/2ML IN SUSP
0.5000 mg | Freq: Two times a day (BID) | RESPIRATORY_TRACT | Status: DC
  Administered 2016-08-19: 0.5 mg via RESPIRATORY_TRACT
  Filled 2016-08-19: qty 2

## 2016-08-19 MED ORDER — INSULIN ASPART 100 UNIT/ML ~~LOC~~ SOLN
0.0000 [IU] | SUBCUTANEOUS | Status: DC
  Administered 2016-08-19: 20 [IU] via SUBCUTANEOUS

## 2016-08-19 MED ORDER — ACETAMINOPHEN 325 MG PO TABS
650.0000 mg | ORAL_TABLET | Freq: Four times a day (QID) | ORAL | Status: DC | PRN
  Administered 2016-08-19: 650 mg via ORAL
  Filled 2016-08-19: qty 2

## 2016-08-19 MED ORDER — IPRATROPIUM-ALBUTEROL 0.5-2.5 (3) MG/3ML IN SOLN
3.0000 mL | Freq: Four times a day (QID) | RESPIRATORY_TRACT | Status: DC
  Administered 2016-08-19 – 2016-08-21 (×8): 3 mL via RESPIRATORY_TRACT
  Filled 2016-08-19 (×8): qty 3

## 2016-08-19 MED ORDER — METHYLPREDNISOLONE SODIUM SUCC 125 MG IJ SOLR
80.0000 mg | Freq: Three times a day (TID) | INTRAMUSCULAR | Status: DC
  Administered 2016-08-19 – 2016-08-20 (×2): 80 mg via INTRAVENOUS
  Filled 2016-08-19 (×2): qty 2

## 2016-08-19 MED ORDER — ACETAMINOPHEN 650 MG RE SUPP: 650.0000 mg | Freq: Four times a day (QID) | RECTAL | Status: DC | PRN

## 2016-08-19 MED ORDER — SODIUM CHLORIDE 0.9 % IV BOLUS (SEPSIS)
500.0000 mL | Freq: Once | INTRAVENOUS | Status: AC
  Administered 2016-08-19: 500 mL via INTRAVENOUS

## 2016-08-19 MED ORDER — IPRATROPIUM-ALBUTEROL 0.5-2.5 (3) MG/3ML IN SOLN
3.0000 mL | Freq: Four times a day (QID) | RESPIRATORY_TRACT | Status: DC
  Administered 2016-08-19: 3 mL via RESPIRATORY_TRACT
  Filled 2016-08-19 (×2): qty 3

## 2016-08-19 MED ORDER — ALBUTEROL (5 MG/ML) CONTINUOUS INHALATION SOLN
15.0000 mg/h | INHALATION_SOLUTION | Freq: Once | RESPIRATORY_TRACT | Status: AC
  Administered 2016-08-19: 15 mg/h via RESPIRATORY_TRACT
  Filled 2016-08-19: qty 20

## 2016-08-19 MED ORDER — HYDROCODONE-ACETAMINOPHEN 10-325 MG PO TABS
1.0000 | ORAL_TABLET | Freq: Three times a day (TID) | ORAL | Status: DC | PRN
Start: 2016-08-19 — End: 2016-08-20
  Administered 2016-08-19: 1 via ORAL
  Filled 2016-08-19 (×2): qty 1

## 2016-08-19 NOTE — Progress Notes (Signed)
Critical ABG results given to Dr. Leonides Schanz. No new orders received at this time. Patient tolerating continuous breathing treatment well at this time.

## 2016-08-19 NOTE — ED Provider Notes (Signed)
TIME SEEN: 1:03 AM  CHIEF COMPLAINT: Shortness of breath  HPI: Patient is a 51 year old female with history of HIV, chronic kidney disease, asthma, hypertension, diabetes, hyperlipidemia who presents emergency department with shortness of breath that started tonight. Patient is supposed to wear 2 L nasal cannula. She reports she has follow-up with the pulmonologist next week. She states they think she has underlying COPD. She has had dry cough. No fevers. No chest pain or chest discomfort. Complaining of chronic back and chronic right knee pain that is unchanged. Status post left BKA from diabetes. EMS reports patient was found hypoxic at home off of oxygen. Here off of oxygen her sats are 75% on room air. She did receive a DuoNeb with EMS.  ROS: See HPI Constitutional: no fever  Eyes: no drainage  ENT: no runny nose   Cardiovascular:  no chest pain  Resp:  SOB  GI: no vomiting GU: no dysuria Integumentary: no rash  Allergy: no hives  Musculoskeletal: no leg swelling  Neurological: no slurred speech ROS otherwise negative  PAST MEDICAL HISTORY/PAST SURGICAL HISTORY:  Past Medical History:  Diagnosis Date  . Acute metabolic encephalopathy 9/72/8206  . Acute on chronic respiratory failure with hypoxia ([place]) 05/19/2016  . Anxiety   . ARF (acute renal failure) ([place]) 05/03/2014  . Arthritis    LEFT HIP  . Asthma    HOSPITALIZED WITH EXCERBATION OF ASTHMA - AND BRONCHITIS AND THE FLU DEC 2013  . Asthma exacerbation attacks 12/28/2011  . Bronchitis 12/28/2011  . CAP (community acquired pneumonia) 07/25/2012  . Chronic kidney disease (CKD), stage IV (severe) ([place]) 11/10/2014  . Class 3 obesity due to excess calories with serious comorbidity and body mass index (BMI) of 40.0 to 44.9 in adult [facility])   . Colles' fracture of left radius   . Depression   . Diabetes mellitus    ON INSULIN AND ORAL MEDICATIONS  . Diabetes mellitus type 2 with complications, uncontrolled ([place]) 04/19/2008    Qualifier: Diagnosis of  By: Tomma Lightning MD, Claiborne Billings     . Diabetic foot infection ([place]) 11/09/2014  . Diabetic ketoacidosis without coma associated with type 2 diabetes mellitus ([place])   . Diabetic neuropathy ([place]) 11/10/2014  . Diarrhea 08/30/2008   Qualifier: Diagnosis of  By: Tomma Lightning MD, Claiborne Billings    . Diastolic dysfunction 01/06/6151  . Difficult intravenous access   . DKA, type 2 ([place]) 04/04/2016  . Dyslipidemia 12/23/2008   Qualifier: Diagnosis of  By: Tomma Lightning MD, Claiborne Billings    . Essential hypertension 04/19/2008   Qualifier: Diagnosis of  By: Tomma Lightning MD, Claiborne Billings    . FATIGUE 07/12/2008   Qualifier: Diagnosis of  By: Tomma Lightning MD, Claiborne Billings    . Femoral neck fracture ([place]) 07/16/2011  . Femur fracture, left ([place]) 07/08/2014  . Fever   . Fracture of distal femur ([place]) 07/08/2014  . GERD (gastroesophageal reflux disease)   . Hereditary and idiopathic peripheral neuropathy 04/19/2008   Qualifier: Diagnosis of  By: Tomma Lightning MD, Claiborne Billings    . Hip fracture, left ([place]) 07/16/2011  . Hip pain   . HIV infection ([place]) 2000  . Human immunodeficiency virus (HIV) disease ([place]) 04/19/2008   [id number] +   . Hyperlipidemia   . Hypertension   . Hyponatremia 12/29/2011  . Influenza A 12/29/2011  . Influenza B   . Insomnia 06/12/2016  . Left hip pain 05/03/2014  . Mood disorder ([place]) 04/19/2008   Qualifier: Diagnosis of  By: Tomma Lightning MD, Claiborne Billings    . Neuropathy  NEUROPATHY HANDS AND FEET  . NSTEMI (non-ST elevated myocardial infarction) ([place]) 05/19/2016  . Obesity hypoventilation syndrome ([place])   . Osteoporosis 07/08/2014  . Pain    SEVERE PAIN LEFT HIP - HX OF LEFT HIP PINNING JULY 2013  . Perimenopausal symptoms 02/13/2012   LMP around 2011. On estrace and provera since around 2012 for hot flashes, mood swings. Estrace 2 mg daily, Provera 2.5 mg for 5 days each month.   . Repeated falls 07/30/2011  . Sepsis ([place]) 04/04/2016  . Shortness of breath    ALLERGIES ARE "ACTING UP"  . SOB (shortness of breath)   . THRUSH 05/20/2008    Qualifier: Diagnosis of  By: Tomma Lightning MD, Claiborne Billings      . Tobacco use disorder 04/14/2010    MEDICATIONS:  Prior to Admission medications   Medication Sig Start Date End Date Taking? Authorizing Provider  acetaminophen (TYLENOL) 500 MG tablet Take 1,000 mg by mouth every 6 (six) hours as needed for mild pain or moderate pain.    [provider]  aspirin EC 81 MG EC tablet Take 1 tablet (81 mg total) by mouth daily. 05/25/16   Rai, Vernelle Emerald, MD  atenolol (TENORMIN) 25 MG tablet TK 1 T PO QD 05/02/16   [provider]  Blood Glucose Monitoring Suppl (ACCU-CHEK AVIVA PLUS) w/Device KIT USE TO TEST BLOOD SUGAR DAILY 07/03/16   Elayne Snare, MD  budesonide (PULMICORT) 0.5 MG/2ML nebulizer solution Take 0.5 mg by nebulization 2 (two) times daily.    [provider]  Cholecalciferol (VITAMIN D3) 5000 UNITS TABS Take 5,000 Units by mouth daily.     [provider]  diphenoxylate-atropine (LOMOTIL) 2.5-0.025 MG tablet Take 1 tablet by mouth 4 (four) times daily as needed for diarrhea or loose stools. 06/12/16   [place] Riches, MD  Eszopiclone 3 MG TABS Take 1 tablet (3 mg total) by mouth at bedtime as needed. Take immediately before bedtime 06/12/16   [place] Riches, MD  fenofibrate (TRICOR) 145 MG tablet TK 1 T PO QD 05/11/16   [provider]  furosemide (LASIX) 80 MG tablet TK 1 T PO QD 05/11/16   [provider]  GENVOYA 150-150-200-10 MG TABS tablet TAKE 1 TABLET BY MOUTH EVERY DAY 12/19/15   [place] Riches, MD  glucose blood (ACCU-CHEK AVIVA) test strip Use to test blood sugar 3 times daily 07/03/16   Elayne Snare, MD  HUMULIN R U-500 KWIKPEN 500 UNIT/ML kwikpen Inject 35-60 Units into the skin. 60 units in the AM, 50 units at lunch, and 35 units at bedtime 05/11/16   [provider]  HYDROcodone-acetaminophen (NORCO) 10-325 MG tablet TK 1 T PO  Q 8 H 04/11/16   [provider]  Insulin Glargine (TOUJEO SOLOSTAR) 300 UNIT/ML SOPN  Inject 50 Units into the skin 2 (two) times daily. Patient taking differently: Inject 70 Units into the skin 2 (two) times daily.  06/21/16   Elayne Snare, MD  Insulin Glargine (TOUJEO SOLOSTAR) 300 UNIT/ML SOPN Inject 65 Units into the skin 2 (two) times daily. 07/29/16   Elayne Snare, MD  ipratropium-albuterol (DUONEB) 0.5-2.5 (3) MG/3ML SOLN Take 3 mLs by nebulization 3 (three) times daily. 05/25/16   Rai, Ripudeep Raliegh Ip, MD  LYRICA 225 MG capsule TK 1 C PO ONCE D 04/20/16   [provider]  metFORMIN (GLUCOPHAGE-XR) 750 MG 24 hr tablet Take 750 mg by mouth daily with breakfast.  06/01/16   [provider]  nystatin (MYCOSTATIN) 100000  UNIT/ML suspension Take 5 mLs (500,000 Units total) by mouth 4 (four) times daily. 05/25/16   Rai, Ripudeep K, MD  pantoprazole (PROTONIX) 40 MG tablet Take 40 mg by mouth daily. 09/02/14   [provider]  potassium chloride SA (K-DUR,KLOR-CON) 20 MEQ tablet Take 1 tablet (20 mEq total) by mouth daily. X 3 days, then check potassium levels if needed to continue 05/25/16   Rai, Ripudeep K, MD  rosuvastatin (CRESTOR) 20 MG tablet Take 20 mg by mouth daily. Reported on 05/02/2015 09/02/14   [provider]  SYMBICORT 80-4.5 MCG/ACT inhaler INL 2 PFS ITL BID 06/06/16   [provider]  tiZANidine (ZANAFLEX) 4 MG tablet Take 4 mg by mouth 2 (two) times daily as needed for muscle spasms.  03/06/16   [provider]  VICTOZA 18 MG/3ML SOPN ADM 1.8 MG [place] D 06/06/16   [provider]    ALLERGIES:  Allergies  Allergen Reactions  . Cortizone-10 [Hydrocortisone] Rash    Per patient, given local injection at knee and developed rash at local site.     SOCIAL HISTORY:  Social History  Substance Use Topics  . Smoking status: Current Every Day Smoker    Packs/day: 0.50    Years: 35.00    Types: E-cigarettes, Cigarettes  . Smokeless tobacco: Never Used  . Alcohol use No     Comment: no drink since 2008    FAMILY  HISTORY: Family History  Problem Relation Age of Onset  . Diabetes Mother   . Hypertension Mother   . Vision loss Mother   . Heart disease Mother   . Hypertension Father   . Thyroid disease Neg Hx     EXAM: Pulse (!) 121   Temp 99.8 F (37.7 C) (Oral)   Resp (!) 22   Ht 5' 8"  (1.727 m)   Wt 104.3 kg (230 lb)   LMP 11/16/2010   SpO2 94%   BMI 34.97 kg/m  CONSTITUTIONAL: Alert and oriented and responds appropriately to questions. Obese, chronically ill-appearing, tearful HEAD: Normocephalic EYES: Conjunctivae clear, pupils appear equal, EOMI ENT: normal nose; moist mucous membranes NECK: Supple, no meningismus, no nuchal rigidity, no LAD  CARD: Regular and tachycardic; S1 and S2 appreciated; no murmurs, no clicks, no rubs, no gallops RESP: Normal chest excursion without splinting, patient is tachypneic and hypoxic on room air, very diminished breath sounds bilaterally with scattered ACT for wheezes, no rhonchi or rales, able to speak short sentences ABD/GI: Normal bowel sounds; non-distended; soft, non-tender, no rebound, no guarding, no peritoneal signs, no hepatosplenomegaly BACK:  The back appears normal and is non-tender to palpation, there is no CVA tenderness EXT: Normal ROM in all joints; non-tender to palpation; no edema; normal capillary refill; no cyanosis, no calf tenderness or swelling, patient is status post left BKA    SKIN: Normal color for age and race; warm; no rash NEURO: Moves all extremities equally PSYCH: The patient's mood and manner are appropriate. Grooming and personal hygiene are appropriate.  MEDICAL DECISION MAKING: Patient here with shortness of breath. Suspect asthma exacerbation. EKG shows sinus tachycardia without other ischemic changes. She denies chest pain or chest discomfort. We'll give continuous albuterol, Atrovent, Solu-Medrol. We'll obtain chest x-ray and ABG.  ED PROGRESS: Patient's ABG shows a pH of 7.329 with a PCO2 of 66.4 and a  bicarbonate of 34.6. She does have a mild leukocytosis. Her creatinine is stable. Her chest x-ray shows no infiltrate or edema. We will reassess after continuous albuterol  treatment.    2:50 AM  Pt seems to be slightly more somnolent than previous which could be because it is 2:50 in the morning. I am concerned however that she did have a mild respiratory acidosis that we may need to put her on BiPAP. She agrees to this plan. She is still wheezing and has diminished aeration. Sats 88% on 4 L nasal cannula. We'll discuss with hospitalist for admission.  3:13 AM Discussed patient's case with hospitalist, Dr. Tamala Julian.  I have recommended admission and patient (and family if present) agree with this plan. Admitting physician will place admission orders.   Patient doing well on BiPAP.  I reviewed all nursing notes, vitals, pertinent previous records, EKGs, lab and urine results, imaging (as available).     EKG Interpretation  Date/Time:  Sunday August 19 2016 00:47:26 EDT Ventricular Rate:  120 PR Interval:    QRS Duration: 91 QT Interval:  317 QTC Calculation: 448 R Axis:   -6 Text Interpretation:  Sinus tachycardia Low voltage, precordial leads Baseline wander in lead(s) V5 Confirmed by Ward, Cyril Mourning [phone]) on 08/19/2016 1:04:01 AM        CRITICAL CARE Performed by: Nyra Jabs   Total critical care time: 55 minutes  Critical care time was exclusive of separately billable procedures and treating other patients.  Critical care was necessary to treat or prevent imminent or life-threatening deterioration.  Critical care was time spent personally by me on the following activities: development of treatment plan with patient and/or surrogate as well as nursing, discussions with consultants, evaluation of patient's response to treatment, examination of patient, obtaining history from patient or surrogate, ordering and performing treatments and interventions, ordering and review of  laboratory studies, ordering and review of radiographic studies, pulse oximetry and re-evaluation of patient's condition.    Ward, Delice Bison, DO 08/19/16 [phone]

## 2016-08-19 NOTE — ED Notes (Signed)
Delay in lab draw MD at bedside at this time.

## 2016-08-19 NOTE — ED Triage Notes (Signed)
Pt BIB EMS from home for AMS. Per EMS, they were called out d/t "nebulizer not working." On arrival, EMS realized pt was altered and brought pt here. Pt left AMA from [facility] yesterday for the same. Pt alert to self, place, and situation; disoriented to time. Pt CBG 600 PTA; received 200 cc NS PTA. Resp e/u; NAD at this time. Left BKA noted on assessment.

## 2016-08-19 NOTE — Progress Notes (Signed)
Patient placed on BiPAP for increased somnolence and decreasing SpO2 on 4L. Patient is tolerating well at this time. RT will continue to monitor.

## 2016-08-19 NOTE — Progress Notes (Signed)
RT attempted RRx2, LR x1 to obtain ABG with no success. RN aware. Additional RT notified to attempt ABG.

## 2016-08-19 NOTE — ED Notes (Signed)
Attempted report x 2 to 2c

## 2016-08-19 NOTE — ED Notes (Signed)
CBG 461 

## 2016-08-19 NOTE — ED Provider Notes (Signed)
Emergency Department Provider Note   I have reviewed the triage vital signs and the nursing notes.   HISTORY  Chief Complaint Altered Mental Status and Hyperglycemia   HPI [name] is a 51 y.o. female with PMH of with PMH of chronic respiratory failure, CAD, IDDM with prior DKA, HTN, HLD resents to the emergency department by EMS for evaluation of altered mental status. The patient was discharged [place] from [facility] hospitalist service earlier today. She states that she was not getting along with the hospital staff member and felt ended so called a taxi to go home. When she got home she states that she lay down and called EMS because her nebulizer wasn't working. She reports a possible fever this AM. No CP or sudden worsening SOB. Her current complaint is that she feels "fuzzy." Denies any illicit drug use or EtOH. Takes hydrocodone for chronic back pain and left leg pain but denies taking any today.   Level 5 caveat: Patient is confused and not able to give a completely linear or reliable history.   Past Medical History:  Diagnosis Date  . Acute metabolic encephalopathy 03/30/1914  . Acute on chronic respiratory failure with hypoxia ([place]) 05/19/2016  . Anxiety   . ARF (acute renal failure) ([place]) 05/03/2014  . Arthritis    LEFT HIP  . Asthma    HOSPITALIZED WITH EXCERBATION OF ASTHMA - AND BRONCHITIS AND THE FLU DEC 2013  . Asthma exacerbation attacks 12/28/2011  . Bronchitis 12/28/2011  . CAP (community acquired pneumonia) 07/25/2012  . Chronic kidney disease (CKD), stage IV (severe) ([place]) 11/10/2014  . Class 3 obesity due to excess calories with serious comorbidity and body mass index (BMI) of 40.0 to 44.9 in adult [facility])   . Colles' fracture of left radius   . Depression   . Diabetes mellitus    ON INSULIN AND ORAL MEDICATIONS  . Diabetes mellitus type 2 with complications, uncontrolled ([place]) 04/19/2008   Qualifier: Diagnosis of  By: Tomma Lightning MD, Claiborne Billings      . Diabetic foot infection ([place]) 11/09/2014  . Diabetic ketoacidosis without coma associated with type 2 diabetes mellitus ([place])   . Diabetic neuropathy ([place]) 11/10/2014  . Diarrhea 08/30/2008   Qualifier: Diagnosis of  By: Tomma Lightning MD, Claiborne Billings    . Diastolic dysfunction 60/06/43  . Difficult intravenous access   . DKA, type 2 ([place]) 04/04/2016  . Dyslipidemia 12/23/2008   Qualifier: Diagnosis of  By: Tomma Lightning MD, Claiborne Billings    . Essential hypertension 04/19/2008   Qualifier: Diagnosis of  By: Tomma Lightning MD, Claiborne Billings    . FATIGUE 07/12/2008   Qualifier: Diagnosis of  By: Tomma Lightning MD, Claiborne Billings    . Femoral neck fracture ([place]) 07/16/2011  . Femur fracture, left ([place]) 07/08/2014  . Fever   . Fracture of distal femur ([place]) 07/08/2014  . GERD (gastroesophageal reflux disease)   . Hereditary and idiopathic peripheral neuropathy 04/19/2008   Qualifier: Diagnosis of  By: Tomma Lightning MD, Claiborne Billings    . Hip fracture, left ([place]) 07/16/2011  . Hip pain   . HIV infection ([place]) 2000  . Human immunodeficiency virus (HIV) disease ([place]) 04/19/2008   [id number] +   . Hyperlipidemia   . Hypertension   . Hyponatremia 12/29/2011  . Influenza A 12/29/2011  . Influenza B   . Insomnia 06/12/2016  . Left hip pain 05/03/2014  . Mood disorder ([place]) 04/19/2008   Qualifier: Diagnosis of  By: Tomma Lightning MD, Claiborne Billings    . Neuropathy  NEUROPATHY HANDS AND FEET  . NSTEMI (non-ST elevated myocardial infarction) ([place]) 05/19/2016  . Obesity hypoventilation syndrome ([place])   . Osteoporosis 07/08/2014  . Pain    SEVERE PAIN LEFT HIP - HX OF LEFT HIP PINNING JULY 2013  . Perimenopausal symptoms 02/13/2012   LMP around 2011. On estrace and provera since around 2012 for hot flashes, mood swings. Estrace 2 mg daily, Provera 2.5 mg for 5 days each month.   . Repeated falls 07/30/2011  . Sepsis ([place]) 04/04/2016  . Shortness of breath    ALLERGIES ARE "ACTING UP"  . SOB (shortness of breath)   . THRUSH 05/20/2008   Qualifier: Diagnosis of  By: Tomma Lightning MD, Claiborne Billings        . Tobacco use disorder 04/14/2010    Patient Active Problem List   Diagnosis Date Noted  . Acute on chronic respiratory failure with hypoxia and hypercapnia ([place]) 08/19/2016  . Leukocytosis 08/19/2016  . CKD (chronic kidney disease), stage III 08/19/2016  . Asthma exacerbation 06/18/2016  . Insomnia 06/12/2016  . Difficult intravenous access   . Acute on chronic respiratory failure with hypoxia ([place]) 05/19/2016  . NSTEMI (non-ST elevated myocardial infarction) ([place]) 05/19/2016  . Acute metabolic encephalopathy 47/09/6281  . Diabetic ketoacidosis without coma associated with type 2 diabetes mellitus ([place])   . SOB (shortness of breath)   . Hyperlipidemia   . Class 3 obesity due to excess calories with serious comorbidity and body mass index (BMI) of 40.0 to 44.9 in adult [facility])   . Obesity hypoventilation syndrome ([place])   . DKA, type 2 ([place]) 04/04/2016  . Diastolic dysfunction 66/29/4765  . Diabetic neuropathy ([place]) 11/10/2014  . Chronic kidney disease (CKD), stage IV (severe) ([place]) 11/10/2014  . Fracture of distal femur ([place]) 07/08/2014  . Osteoporosis 07/08/2014  . Femur fracture, left ([place]) 07/08/2014  . Colles' fracture of left radius   . Left hip pain 05/03/2014  . Hip pain   . Perimenopausal symptoms 02/13/2012  . Hyponatremia 12/29/2011  . Asthma exacerbation attacks 12/28/2011  . Bronchitis 12/28/2011  . Repeated falls 07/30/2011  . Hip fracture, left ([place]) 07/16/2011  . Femoral neck fracture ([place]) 07/16/2011  . Dyslipidemia 12/23/2008  . THRUSH 05/20/2008  . Human immunodeficiency virus (HIV) disease ([place]) 04/19/2008  . Diabetes mellitus type 2 with complications, uncontrolled ([place]) 04/19/2008  . Mood disorder ([place]) 04/19/2008  . Hereditary and idiopathic peripheral neuropathy 04/19/2008  . Essential hypertension 04/19/2008    Past Surgical History:  Procedure Laterality Date  . AMPUTATION Left 11/18/2014   Procedure: LEFT BELOW KNEE AMPUTATION;  Surgeon:  Leandrew Koyanagi, MD;  Location: [facility];  Service: Orthopedics;  Laterality: Left;  . CAST APPLICATION Left 04/06/5033   Procedure: CAST APPLICATION (FIBERGLASS);  Surgeon: Latanya Maudlin, MD;  Location: [facility];  Service: Orthopedics;  Laterality: Left;  CLOSED REDUCTION OF LEFT COLLES FRACTURE WITH SHORT ARM CAST  . HARDWARE REMOVAL Left 05/05/2014   Procedure: HARDWARE REMOVAL LEFT HIP;  Surgeon: Latanya Maudlin, MD;  Location: [facility];  Service: Orthopedics;  Laterality: Left;  REMOVAL BIOMET 6.5-8.0 CANNULATED SCREW  . HIP ARTHROPLASTY Left 05/05/2014   Procedure: ARTHROPLASTY OPEN REDUCTION INTERNAL FIXATION LEFT HIP AND REMOVAL OF TWO CANNULATED SCREW;  Surgeon: Latanya Maudlin, MD;  Location: [facility];  Service: Orthopedics;  Laterality: Left;  . HIP PINNING,CANNULATED  07/16/2011   Procedure: CANNULATED HIP PINNING;  Surgeon: Gearlean Alf, MD;  [facility];  Service: Orthopedics;  Laterality: Left;  . HIP PINNING,CANNULATED  Left 04/09/2012   Procedure: CANNULATED HIP PINNING AND HARDWARE REVISION;  Surgeon: Gearlean Alf, MD;  [facility]: [facility];  Service: Orthopedics;  Laterality: Left;  . I&D EXTREMITY Left 11/16/2014   Procedure:  DEBRIDEMENT OF LEFT FOOT POSSIBLE APPLICATION OF INTEGRIA AND VAC ;  Surgeon: Irene Limbo, MD;  Location: [facility];  Service: Plastics;  Laterality: Left;  . PERIPHERAL VASCULAR CATHETERIZATION N/A 11/15/2014   Procedure: Abdominal Aortogram;  Surgeon: Conrad [place], MD;  Location: [facility];  Service: Cardiovascular;  Laterality: N/A;  . PERIPHERAL VASCULAR CATHETERIZATION  11/15/2014   Procedure: Lower Extremity Angiography;  Surgeon: Conrad , MD;  Location: [facility];  Service: Cardiovascular;;  . PERIPHERAL VASCULAR CATHETERIZATION Left 11/15/2014   Procedure: Peripheral Vascular Intervention;  Surgeon: Conrad , MD;  Location: [facility];  Service: Cardiovascular;  Laterality: Left;  popliteal artery stenting    Current  Outpatient Rx  . Order #: [id number] Class: Historical Med  . Order #: [id number] Class: No Print  . Order #: [id number] Class: Historical Med  . Order #: [id number] Class: Normal  . Order #: [id number] Class: Historical Med  . Order #: [id number] Class: Historical Med  . Order #: [id number] Class: Print  . Order #: [id number] Class: Print  . Order #: [id number] Class: Historical Med  . Order #: [id number] Class: Historical Med  . Order #: [id number] Class: Normal  . Order #: [id number] Class: Normal  . Order #: [id number] Class: Historical Med  . Order #: [id number] Class: Historical Med  . Order #: [id number] Class: Normal  . Order #: [id number] Class: Normal  . Order #: [id number] Class: No Print  . Order #: [id number] Class: Historical Med  . Order #: [id number] Class: Historical Med  . Order #: [id number] Class: No Print  . Order #: [id number] Class: Historical Med  . Order #: [id number] Class: No Print  . Order #: [id number] Class: Historical Med  . Order #: [id number] Class: Historical Med  . Order #: [id number] Class: Historical Med  . Order #: [id number] Class: Historical Med    Allergies Cortizone-10 [hydrocortisone]  Family History  Problem Relation Age of Onset  . Diabetes Mother   . Hypertension Mother   . Vision loss Mother   . Heart disease Mother   . Hypertension Father   . Thyroid disease Neg Hx     Social History Social History  Substance Use Topics  . Smoking status: Current Every Day Smoker    Packs/day: 0.50    Years: 35.00    Types: E-cigarettes, Cigarettes  . Smokeless tobacco: Never Used  . Alcohol use No     Comment: no drink since 2008    Review of Systems  Level 5 caveat: Patient is confused and not able to give a completely linear or reliable history.  ____________________________________________   PHYSICAL EXAM:  VITAL SIGNS: Vitals:   08/19/16 1740  Temp: 98.7 F (37.1 C)   Constitutional: Drowsy but giving a rambling, non-cohesive history. Alert to self but confused  about year.  Eyes: Conjunctivae are normal. Head: Atraumatic. Nose: No congestion/rhinnorhea. Mouth/Throat: Mucous membranes are moist. Neck: No stridor. Cardiovascular: Normal rate, regular rhythm. Good peripheral circulation. Grossly normal heart sounds.   Respiratory: Normal respiratory effort.  No retractions. Lungs with faint end-expiratory wheezing.  Gastrointestinal: Soft and nontender.  Musculoskeletal: Trace LE edema with left BKA. Neurologic:  Normal speech is slightly slurred. No gross focal neurologic deficits are appreciated.  Skin:  Skin is warm, dry and intact. No rash noted.  ____________________________________________   LABS (all labs ordered are listed, but only abnormal  results are displayed)  Labs Reviewed  CBG MONITORING, ED - Abnormal; Notable for the following:       Result Value   Glucose-Capillary 502 (*)    All other components within normal limits  URINE CULTURE  CULTURE, BLOOD (ROUTINE X 2)  CULTURE, BLOOD (ROUTINE X 2)  COMPREHENSIVE METABOLIC PANEL  ETHANOL  CBC WITH DIFFERENTIAL/PLATELET  URINALYSIS, ROUTINE W REFLEX MICROSCOPIC  RAPID URINE DRUG SCREEN, HOSP PERFORMED  I-STAT CG4 LACTIC ACID, ED  I-STAT TROPONIN, ED  I-STAT ARTERIAL BLOOD GAS, ED   ____________________________________________  EKG   EKG Interpretation  Date/Time:  Sunday August 19 2016 19:56:32 EDT Ventricular Rate:  115 PR Interval:    QRS Duration: 88 QT Interval:  303 QTC Calculation: 419 R Axis:   1 Text Interpretation:  Fast sinus arrhythmia Low voltage, precordial leads No STEMI. Similar to prior.   Confirmed by Nanda Quinton [phone]) on 08/19/2016 8:09:45 PM Also confirmed by Nanda Quinton [phone]), editor Hattie Perch [phone])  on 08/20/2016 6:56:19 AM       ____________________________________________  RADIOLOGY  Dg Chest Port 1 View  Result Date: 08/19/2016 CLINICAL DATA:  Shortness of breath EXAM: PORTABLE CHEST 1 VIEW COMPARISON:  06/18/2016  FINDINGS: Low lung volumes. No focal consolidation or effusion. Borderline cardiomegaly. No pneumothorax. IMPRESSION: Low lung volumes with borderline cardiomegaly. No edema or infiltrate. Electronically Signed   By: Donavan Foil M.D.   On: 08/19/2016 01:20    ____________________________________________   PROCEDURES  Procedure(s) performed:   Procedures  CRITICAL CARE Performed by: Margette Fast Total critical care time: 32 minutes Critical care time was exclusive of separately billable procedures and treating other patients. Critical care was necessary to treat or prevent imminent or life-threatening deterioration. Critical care was time spent personally by me on the following activities: development of treatment plan with patient and/or surrogate as well as nursing, discussions with consultants, evaluation of patient's response to treatment, examination of patient, obtaining history from patient or surrogate, ordering and performing treatments and interventions, ordering and review of laboratory studies, ordering and review of radiographic studies, pulse oximetry and re-evaluation of patient's condition.  Nanda Quinton, MD Emergency Medicine  ____________________________________________   INITIAL IMPRESSION / ASSESSMENT AND PLAN / ED COURSE  Pertinent labs & imaging results that were available during my care of the patient were reviewed by me and considered in my medical decision making (see chart for details).  Patient returns to the emergency department after signing out [place] from the hospital service at [facility] service after presentation for COPD exacerbation. Initially managed on BiPAP. Returns 5 hours later by EMS with confusion. No significant respiratory distress. Blood sugar at 502 on arrival. Unclear etiology for AMS at this time but have higher index of suspicion for hypercarbia/hypoxemia vs metabolic encephalopathy 2/2 hyperglycemia. No fever  here but will obtain cultures and repeat CXR. No focal deficits to necessitate CT head.   07:06 PM Labs reviewed. RT unable to obtain ABG after multiple attempts. Will send VBG to to assess CO2. No DKA on chemistry. Mild lactate elevation likely explained by multiple neb treatments. No evidence to support diagnosis or sepsis at this time.   The patient is noted to have a lactate>2. With the current information available to me, I don't think the patient is in septic shock. The lactate>2, is related to respiratory distress/ respiratory failure(chronic).  08:02 PM Patient more awake and alert after Duoneb. Tachycardia likely related to albuterol. Plan for admission and  patient is agreeable to this plan.   Discussed patient's case with Hospitalist, Dr. Maudie Mercury to request admission. Patient and family (if present) updated with plan. Care transferred to Hospitalist service.  I reviewed all nursing notes, vitals, pertinent old records, EKGs, labs, imaging (as available).  ____________________________________________  FINAL CLINICAL IMPRESSION(S) / ED DIAGNOSES  Final diagnoses:  Somnolence  Chronic respiratory failure with hypercapnia ([name])  Renal insufficiency     MEDICATIONS GIVEN DURING THIS VISIT:  Medications  ARIPiprazole (ABILIFY) tablet 10 mg (10 mg Oral Given 08/20/16 1410)  fluticasone furoate-vilanterol (BREO ELLIPTA) 100-25 MCG/INH 1 puff (1 puff Inhalation Given 08/20/16 0751)  clonazePAM (KLONOPIN) tablet 0.5 mg (0.5 mg Oral Given 08/20/16 0504)  DULoxetine (CYMBALTA) DR capsule 60 mg (60 mg Oral Given 08/20/16 1410)  fenofibrate tablet 54 mg (54 mg Oral Given 08/20/16 1810)  liraglutide SOPN 1.8 mg (1.8 mg Subcutaneous Not Given 08/20/16 1000)  magnesium oxide (MAG-OX) tablet 400 mg (400 mg Oral Given 08/20/16 1811)  pregabalin (LYRICA) capsule 100 mg (100 mg Oral Given 08/20/16 1252)  temazepam (RESTORIL) capsule 30 mg (30 mg Oral Given 08/20/16 0504)  insulin glargine (LANTUS)  injection 65 Units (65 Units Subcutaneous Not Given 08/20/16 1000)  tiZANidine (ZANAFLEX) tablet 4 mg (4 mg Oral Given 08/20/16 1410)  aspirin EC tablet 81 mg (81 mg Oral Given 08/20/16 1410)  ipratropium-albuterol (DUONEB) 0.5-2.5 (3) MG/3ML nebulizer solution 3 mL (not administered)  elvitegravir-cobicistat-emtricitabine-tenofovir (GENVOYA) 150-150-200-10 MG tablet 1 tablet (1 tablet Oral Given 08/20/16 1809)  pantoprazole (PROTONIX) EC tablet 40 mg (40 mg Oral Given 08/20/16 1252)  rosuvastatin (CRESTOR) tablet 20 mg (20 mg Oral Given 08/20/16 1410)  cholecalciferol (VITAMIN D) tablet 5,000 Units (5,000 Units Oral Given 08/20/16 1410)  insulin aspart (novoLOG) injection 0-9 Units (5 Units Subcutaneous Given 08/20/16 1807)  insulin aspart (novoLOG) injection 0-5 Units (5 Units Subcutaneous Given 08/20/16 0506)  heparin injection 5,000 Units (5,000 Units Subcutaneous Given 08/20/16 1810)  acetaminophen (TYLENOL) tablet 650 mg (650 mg Oral Given 08/20/16 1252)    Or  acetaminophen (TYLENOL) suppository 650 mg ( Rectal See Alternative 08/20/16 1252)  azithromycin (ZITHROMAX) 500 mg in dextrose 5 % 250 mL IVPB (0 mg Intravenous Stopped 08/20/16 0517)  ipratropium-albuterol (DUONEB) 0.5-2.5 (3) MG/3ML nebulizer solution 3 mL (3 mLs Nebulization Given 08/20/16 1413)  methylPREDNISolone sodium succinate (SOLU-MEDROL) 125 mg/2 mL injection 80 mg (not administered)  0.9 %  sodium chloride infusion ( Intravenous Stopped 08/20/16 1409)  HYDROcodone-acetaminophen (NORCO) 10-325 MG per tablet 1 tablet (not administered)  insulin aspart (novoLOG) injection 10 Units (10 Units Intravenous Given 08/19/16 1934)  sodium chloride 0.9 % bolus 500 mL (0 mLs Intravenous Stopped 08/19/16 2137)  ipratropium-albuterol (DUONEB) 0.5-2.5 (3) MG/3ML nebulizer solution 3 mL (3 mLs Nebulization Given 08/19/16 1919)     NEW OUTPATIENT MEDICATIONS STARTED DURING THIS VISIT:  None   Note:  This document was prepared using Dragon voice  recognition software and may include unintentional dictation errors.  Nanda Quinton, MD Emergency Medicine   Natia Fahmy, [facility], MD 08/20/16 [phone]

## 2016-08-19 NOTE — H&P (Signed)
History and Physical    [name] BWL:[id number] DOB: Sep 20, 1965 DOA: 08/19/2016  Referring MD/NP/PA: Dr. Leonides Schanz PCP: Michel Harrow, PA-C  Patient coming from:  Home via EMS  Chief Complaint: Shortness of breath  HPI: [name] is a 51 y.o. female with medical history significant of HIV, HTN, DM type II, obesity, s/p Lt BKA, asthma/COPD, oxygen dependent of 2 L, and tobacco abuse; who presented with complaints of acute onset of shortness of breath starting last night. She complains of a continued dry cough. Denies having any fever, chest pain, change in weight, or leg swelling. Upon EMS arrival patient was reportedly not on oxygen with O2 sats as low as 75% on room air. Review of records shows that the patient was admitted from 6/18-6/20 for the same.   ED Course:  Upon admission into the emergency department patient was seen to be afebrile, pulse 121, respirations 22, O2 saturations maintained on 4 L of nasal cannula oxygen. Initial ABG revealed pH of 7.329, PCO2 of 66.4, PO2 of 85 on 2 L of oxygen. Labs revealed WBC 13.8, creatinine 1.37, and glucose 263. Patient was given 125 mg of Solu-Medrol, DuoNeb, and continuous albuterol neb. Patient was placed on BiPAP after becoming more somnolent.  Review of Systems: Review of Systems  Constitutional: Negative for chills and fever.  HENT: Negative for ear discharge and ear pain.   Eyes: Negative for photophobia and pain.  Respiratory: Positive for cough and shortness of breath. Negative for stridor.   Cardiovascular: Negative for chest pain and palpitations.  Gastrointestinal: Negative for abdominal pain, nausea and vomiting.  Genitourinary: Negative for frequency and hematuria.  Musculoskeletal: Negative for back pain and joint pain.  Skin: Negative for itching and rash.  Neurological: Negative for sensory change and speech change.  Endo/Heme/Allergies: Positive for environmental allergies.  Psychiatric/Behavioral: Negative  for hallucinations and substance abuse.    Past Medical History:  Diagnosis Date  . Acute metabolic encephalopathy 6/81/1572  . Acute on chronic respiratory failure with hypoxia ([place]) 05/19/2016  . Anxiety   . ARF (acute renal failure) ([place]) 05/03/2014  . Arthritis    LEFT HIP  . Asthma    HOSPITALIZED WITH EXCERBATION OF ASTHMA - AND BRONCHITIS AND THE FLU DEC 2013  . Asthma exacerbation attacks 12/28/2011  . Bronchitis 12/28/2011  . CAP (community acquired pneumonia) 07/25/2012  . Chronic kidney disease (CKD), stage IV (severe) ([place]) 11/10/2014  . Class 3 obesity due to excess calories with serious comorbidity and body mass index (BMI) of 40.0 to 44.9 in adult [facility])   . Colles' fracture of left radius   . Depression   . Diabetes mellitus    ON INSULIN AND ORAL MEDICATIONS  . Diabetes mellitus type 2 with complications, uncontrolled ([place]) 04/19/2008   Qualifier: Diagnosis of  By: Tomma Lightning MD, Claiborne Billings     . Diabetic foot infection ([place]) 11/09/2014  . Diabetic ketoacidosis without coma associated with type 2 diabetes mellitus ([place])   . Diabetic neuropathy ([place]) 11/10/2014  . Diarrhea 08/30/2008   Qualifier: Diagnosis of  By: Tomma Lightning MD, Claiborne Billings    . Diastolic dysfunction 62/0/3559  . Difficult intravenous access   . DKA, type 2 ([place]) 04/04/2016  . Dyslipidemia 12/23/2008   Qualifier: Diagnosis of  By: Tomma Lightning MD, Claiborne Billings    . Essential hypertension 04/19/2008   Qualifier: Diagnosis of  By: Tomma Lightning MD, Claiborne Billings    . FATIGUE 07/12/2008   Qualifier: Diagnosis of  By: Tomma Lightning MD, Claiborne Billings    .  Femoral neck fracture ([place]) 07/16/2011  . Femur fracture, left ([place]) 07/08/2014  . Fever   . Fracture of distal femur ([place]) 07/08/2014  . GERD (gastroesophageal reflux disease)   . Hereditary and idiopathic peripheral neuropathy 04/19/2008   Qualifier: Diagnosis of  By: Tomma Lightning MD, Claiborne Billings    . Hip fracture, left ([place]) 07/16/2011  . Hip pain   . HIV infection ([place]) 2000  . Human immunodeficiency virus (HIV) disease  ([place]) 04/19/2008   [id number] +   . Hyperlipidemia   . Hypertension   . Hyponatremia 12/29/2011  . Influenza A 12/29/2011  . Influenza B   . Insomnia 06/12/2016  . Left hip pain 05/03/2014  . Mood disorder ([place]) 04/19/2008   Qualifier: Diagnosis of  By: Tomma Lightning MD, Claiborne Billings    . Neuropathy    NEUROPATHY HANDS AND FEET  . NSTEMI (non-ST elevated myocardial infarction) ([place]) 05/19/2016  . Obesity hypoventilation syndrome ([place])   . Osteoporosis 07/08/2014  . Pain    SEVERE PAIN LEFT HIP - HX OF LEFT HIP PINNING JULY 2013  . Perimenopausal symptoms 02/13/2012   LMP around 2011. On estrace and provera since around 2012 for hot flashes, mood swings. Estrace 2 mg daily, Provera 2.5 mg for 5 days each month.   . Repeated falls 07/30/2011  . Sepsis ([place]) 04/04/2016  . Shortness of breath    ALLERGIES ARE "ACTING UP"  . SOB (shortness of breath)   . THRUSH 05/20/2008   Qualifier: Diagnosis of  By: Tomma Lightning MD, Claiborne Billings      . Tobacco use disorder 04/14/2010    Past Surgical History:  Procedure Laterality Date  . AMPUTATION Left 11/18/2014   Procedure: LEFT BELOW KNEE AMPUTATION;  Surgeon: Leandrew Koyanagi, MD;  Location: [facility];  Service: Orthopedics;  Laterality: Left;  . CAST APPLICATION Left 08/07/5641   Procedure: CAST APPLICATION (FIBERGLASS);  Surgeon: Latanya Maudlin, MD;  Location: [facility];  Service: Orthopedics;  Laterality: Left;  CLOSED REDUCTION OF LEFT COLLES FRACTURE WITH SHORT ARM CAST  . HARDWARE REMOVAL Left 05/05/2014   Procedure: HARDWARE REMOVAL LEFT HIP;  Surgeon: Latanya Maudlin, MD;  Location: [facility];  Service: Orthopedics;  Laterality: Left;  REMOVAL BIOMET 6.5-8.0 CANNULATED SCREW  . HIP ARTHROPLASTY Left 05/05/2014   Procedure: ARTHROPLASTY OPEN REDUCTION INTERNAL FIXATION LEFT HIP AND REMOVAL OF TWO CANNULATED SCREW;  Surgeon: Latanya Maudlin, MD;  Location: [facility];  Service: Orthopedics;  Laterality: Left;  . HIP PINNING,CANNULATED  07/16/2011   Procedure: CANNULATED HIP PINNING;  Surgeon: Gearlean Alf, MD;  Location: [facility];  Service: Orthopedics;  Laterality: Left;  . HIP PINNING,CANNULATED Left 04/09/2012   Procedure: CANNULATED HIP PINNING AND HARDWARE REVISION;  Surgeon: Gearlean Alf, MD;  Location: [facility];  Service: Orthopedics;  Laterality: Left;  . I&D EXTREMITY Left 11/16/2014   Procedure:  DEBRIDEMENT OF LEFT FOOT POSSIBLE APPLICATION OF INTEGRIA AND VAC ;  Surgeon: Irene Limbo, MD;  Location: [facility];  Service: Plastics;  Laterality: Left;  . PERIPHERAL VASCULAR CATHETERIZATION N/A 11/15/2014   Procedure: Abdominal Aortogram;  Surgeon: Conrad Iron Belt, MD;  Location: [facility];  Service: Cardiovascular;  Laterality: N/A;  . PERIPHERAL VASCULAR CATHETERIZATION  11/15/2014   Procedure: Lower Extremity Angiography;  Surgeon: Conrad Concepcion, MD;  Location: [place] [facility];  Service: Cardiovascular;;  . PERIPHERAL VASCULAR CATHETERIZATION Left 11/15/2014   Procedure: Peripheral Vascular Intervention;  Surgeon: Conrad , MD;  Location: [facility];  Service: Cardiovascular;  Laterality: Left;  popliteal artery stenting     reports that she has been smoking E-cigarettes and Cigarettes.  She has a 17.50 pack-year smoking history. She has never used smokeless tobacco. She reports that she does not drink alcohol or use drugs.  Allergies  Allergen Reactions  . Cortizone-10 [Hydrocortisone] Rash    Per patient, given local injection at knee and developed rash at local site.     Family History  Problem Relation Age of Onset  . Diabetes Mother   . Hypertension Mother   . Vision loss Mother   . Heart disease Mother   . Hypertension Father   . Thyroid disease Neg Hx     Prior to Admission medications   Medication Sig Start Date End Date Taking? Authorizing Provider  acetaminophen (TYLENOL) 500 MG tablet Take 1,000 mg by mouth every 6 (six) hours as needed for mild pain or moderate pain.    [provider]  aspirin EC 81 MG EC tablet Take 1  tablet (81 mg total) by mouth daily. 05/25/16   Rai, Vernelle Emerald, MD  atenolol (TENORMIN) 25 MG tablet TK 1 T PO QD 05/02/16   [provider]  Blood Glucose Monitoring Suppl (ACCU-CHEK AVIVA PLUS) w/Device KIT USE TO TEST BLOOD SUGAR DAILY 07/03/16   Elayne Snare, MD  budesonide (PULMICORT) 0.5 MG/2ML nebulizer solution Take 0.5 mg by nebulization 2 (two) times daily.    [provider]  Cholecalciferol (VITAMIN D3) 5000 UNITS TABS Take 5,000 Units by mouth daily.     [provider]  diphenoxylate-atropine (LOMOTIL) 2.5-0.025 MG tablet Take 1 tablet by mouth 4 (four) times daily as needed for diarrhea or loose stools. 06/12/16   Campbell Riches, MD  Eszopiclone 3 MG TABS Take 1 tablet (3 mg total) by mouth at bedtime as needed. Take immediately before bedtime 06/12/16   Campbell Riches, MD  fenofibrate (TRICOR) 145 MG tablet TK 1 T PO QD 05/11/16   [provider]  furosemide (LASIX) 80 MG tablet TK 1 T PO QD 05/11/16   [provider]  GENVOYA 150-150-200-10 MG TABS tablet TAKE 1 TABLET BY MOUTH EVERY DAY 12/19/15   Campbell Riches, MD  glucose blood (ACCU-CHEK AVIVA) test strip Use to test blood sugar 3 times daily 07/03/16   Elayne Snare, MD  HUMULIN R U-500 KWIKPEN 500 UNIT/ML kwikpen Inject 35-60 Units into the skin. 60 units in the AM, 50 units at lunch, and 35 units at bedtime 05/11/16   [provider]  HYDROcodone-acetaminophen (NORCO) 10-325 MG tablet TK 1 T PO  Q 8 H 04/11/16   [provider]  Insulin Glargine (TOUJEO SOLOSTAR) 300 UNIT/ML SOPN Inject 50 Units into the skin 2 (two) times daily. Patient taking differently: Inject 70 Units into the skin 2 (two) times daily.  06/21/16   Elayne Snare, MD  Insulin Glargine (TOUJEO SOLOSTAR) 300 UNIT/ML SOPN Inject 65 Units into the skin 2 (two) times daily. 07/29/16   Elayne Snare, MD  ipratropium-albuterol (DUONEB) 0.5-2.5 (3) MG/3ML SOLN Take 3 mLs by nebulization 3 (three) times daily.  05/25/16   Rai, Ripudeep Raliegh Ip, MD  LYRICA 225 MG capsule TK 1 C PO ONCE D 04/20/16   [provider]  metFORMIN (GLUCOPHAGE-XR) 750 MG 24 hr tablet Take 750 mg by mouth daily with breakfast.  06/01/16   [provider]  nystatin (MYCOSTATIN) 100000 UNIT/ML suspension Take 5 mLs (500,000 Units total) by mouth 4 (four) times daily. 05/25/16  Rai, Ripudeep K, MD  pantoprazole (PROTONIX) 40 MG tablet Take 40 mg by mouth daily. 09/02/14   [provider]  potassium chloride SA (K-DUR,KLOR-CON) 20 MEQ tablet Take 1 tablet (20 mEq total) by mouth daily. X 3 days, then check potassium levels if needed to continue 05/25/16   Rai, Ripudeep K, MD  rosuvastatin (CRESTOR) 20 MG tablet Take 20 mg by mouth daily. Reported on 05/02/2015 09/02/14   [provider]  SYMBICORT 80-4.5 MCG/ACT inhaler INL 2 PFS ITL BID 06/06/16   [provider]  tiZANidine (ZANAFLEX) 4 MG tablet Take 4 mg by mouth 2 (two) times daily as needed for muscle spasms.  03/06/16   [provider]  VICTOZA 18 MG/3ML SOPN ADM 1.8 MG [place] D 06/06/16   [provider]    Physical Exam:  Constitutional: Obese female in mild respiratory distress  Vitals:   08/19/16 0042 08/19/16 0049 08/19/16 0050 08/19/16 0313  Pulse:  (!) 121    Resp:  (!) 22    Temp:  99.8 F (37.7 C)    TempSrc:  Oral    SpO2: 90% 94%  99%  Weight:   104.3 kg (230 lb)   Height:   5' 8"  (1.727 m)    Eyes: PERRL, lids and conjunctivae normal ENMT: Mucous membranes are moist. Posterior pharynx clear of any exudate or lesions.Normal dentition.  Neck: normal, supple, no masses, no thyromegaly Respiratory: Mild tachypnea with diffuse expiratory wheezes appreciated. Currently on BiPAP. Cardiovascular: Tachycardic, no murmurs / rubs / gallops. No extremity edema. 2+ pedal pulses. No carotid bruits.  Abdomen: no tenderness, no masses palpated. No hepatosplenomegaly. Bowel sounds positive.  Musculoskeletal: no clubbing / cyanosis.  Left BKA Skin: no rashes, lesions, ulcers. No induration Neurologic: CN 2-12 grossly intact. Sensation intact, DTR normal. Strength 5/5 in all 4.  Psychiatric: Normal judgment and insight. Lethargic but easily arousable oriented x 3. Normal mood.     Labs on Admission: I have personally reviewed following labs and imaging studies  CBC:  Recent Labs Lab 08/19/16 0102  WBC 13.8*  NEUTROABS 10.5*  HGB 13.3  HCT 43.9  MCV 89.0  PLT 951   Basic Metabolic Panel:  Recent Labs Lab 08/19/16 0102  NA 141  K 4.4  CL 99*  CO2 32  GLUCOSE 263*  BUN 15  CREATININE 1.37*  CALCIUM 9.3   GFR: Estimated Creatinine Clearance: 60.7 mL/min (A) (by C-G formula based on SCr of 1.37 mg/dL (H)). Liver Function Tests: No results for input(s): AST, ALT, ALKPHOS, BILITOT, PROT, ALBUMIN in the last 168 hours. No results for input(s): LIPASE, AMYLASE in the last 168 hours. No results for input(s): AMMONIA in the last 168 hours. Coagulation Profile: No results for input(s): INR, PROTIME in the last 168 hours. Cardiac Enzymes: No results for input(s): CKTOTAL, CKMB, CKMBINDEX, TROPONINI in the last 168 hours. BNP (last 3 results) No results for input(s): PROBNP in the last 8760 hours. HbA1C: No results for input(s): HGBA1C in the last 72 hours. CBG: No results for input(s): GLUCAP in the last 168 hours. Lipid Profile: No results for input(s): CHOL, HDL, LDLCALC, TRIG, CHOLHDL, LDLDIRECT in the last 72 hours. Thyroid Function Tests: No results for input(s): TSH, T4TOTAL, FREET4, T3FREE, THYROIDAB in the last 72 hours. Anemia Panel: No results for input(s): VITAMINB12, FOLATE, FERRITIN, TIBC, IRON, RETICCTPCT in the last 72 hours. Urine analysis:    Component Value Date/Time   COLORURINE YELLOW 06/19/2016 1145   APPEARANCEUR HAZY (A) 06/19/2016  1145   LABSPEC 1.009 06/19/2016 1145   PHURINE 5.0 06/19/2016 1145   GLUCOSEU >=500 (A) 06/19/2016 1145   GLUCOSEU 250 (A) 03/16/2015 1612    HGBUR NEGATIVE 06/19/2016 1145   BILIRUBINUR NEGATIVE 06/19/2016 1145   KETONESUR NEGATIVE 06/19/2016 1145   PROTEINUR NEGATIVE 06/19/2016 1145   UROBILINOGEN 0.2 03/16/2015 1612   NITRITE NEGATIVE 06/19/2016 1145   LEUKOCYTESUR NEGATIVE 06/19/2016 1145   Sepsis Labs: No results found for this or any previous visit (from the past 240 hour(s)).   Radiological Exams on Admission: Dg Chest Port 1 View  Result Date: 08/19/2016 CLINICAL DATA:  Shortness of breath EXAM: PORTABLE CHEST 1 VIEW COMPARISON:  06/18/2016 FINDINGS: Low lung volumes. No focal consolidation or effusion. Borderline cardiomegaly. No pneumothorax. IMPRESSION: Low lung volumes with borderline cardiomegaly. No edema or infiltrate. Electronically Signed   By: Donavan Foil M.D.   On: 08/19/2016 01:20    EKG: Independently reviewed. Sinus tachycardia 120 beats per minutes  Assessment/Plan Acute on chronic respiratory failure with hypercapnia and hypoxia, asthma/COPD exacerbation: Patient presents with hypoxia and signs of respiratory acidosis with elevated CO2 on initial ABG. Patient placed BiPAP due to lethargy. - Admit to stepdown - Continuous pulse oximetry - Bipap as needed - Solu-Medrol IV - DuoNeb QID and prn shortness of breath/wheezing - Brovana and budesonide nebs -  Will need to complete med reconciliation once pharmacy verifies medications  Leukocytosis: WBC elevated at 13.8. - Recheck CBC in a.m.  Chronic diastolic CHF: Last echo on 05/24/2016 showed EF of 6065% with grade 2 diastolic dysfunction. - Strict intake and output  - Check BNP  HIV: Follow-up in the outpatient setting by Dr. Johnnye Sima infectious disease.  Diabetes mellitus type 2, complicated with hyperglycemia: Last hemoglobin A1c on file Was 9.8 on 05/20/2016. Patient on by Dr. Elayne Snare of endocrinology - Hypoglycemic protocol - Recheck hemoglobin A1c in a.m. - Hold metformin - CBGs every 4 hours while on BiPAP with resisted SSI - Restart  home regimen once off BiPAP  Chronic kidney disease stage III : Patient with baseline creatinine appears to be present 1.4. She presents with a creatinine of 1.37 on admission. - Continue to monitor    Obesity BMI 35   Tobacco abuse - Counseled on need for cessation of tobacco  DVT prophylaxis: lovenox Code Status: full  Family Communication: No family present at bedside Disposition Plan: likely discharge home once medically stable  Consults called: None  Admission status: Inpatient  Norval Morton MD [facility] Pager [phone]   If 7PM-7AM, please contact night-coverage [email or web address] Password [facility]  08/19/2016, 3:43 AM

## 2016-08-19 NOTE — Discharge Summary (Addendum)
Physician Discharge Summary  [name] YBO:[id number] DOB: 1965/07/03 DOA: 08/19/2016  PCP: Michel Harrow, PA-C  Admit date: 08/19/2016 Discharge date: 08/19/2016  Patient left hospital AGAINST MEDICAL ADVICE.  Brief/Interim Summary: 51 year old female with history of HIV, hypertension, type 2 diabetes, status post left BKA, asthma, chronic respiratory failure with hypoxia on 2 L of oxygen, tobacco abuse presented with acute onset of shortness of breath likely in the setting of acute exacerbation of COPD. Patient required BiPAP initiated and weaned to nasal cannula. She was treated with bronchodilators and IV Solu-Medrol. Found to have uncontrolled type 2 diabetes with hyperglycemia. This morning, patient has diffuse wheezing. Patient feels better than last night when she came in. She has uncontrolled hyperglycemia in the setting of steroid. Patient refused to stay in the hospital for further management and treatment. She left hospital AGAINST MEDICAL ADVICE. I educated patient regarding importance of receiving treatment. She was alert awake oriented and understand the clinical condition. She can make her clinical decision. Patient's nurse was at bedside.  Discharge Diagnoses:  Principal Problem:   Acute on chronic respiratory failure with hypoxia and hypercapnia ([name]) Active Problems:   Human immunodeficiency virus (HIV) disease ([name])   Diabetes mellitus type 2 with complications, uncontrolled ([name])   Diastolic dysfunction   Asthma exacerbation   Leukocytosis   CKD (chronic kidney disease), stage III    Discharge Instructions     Allergies  Allergen Reactions  . Cortizone-10 [Hydrocortisone] Rash    Per patient, given local injection at knee and developed rash at local site.     Consultations: None  Procedures/Studies: BiPAP  Subjective: Seen and examined at bedside. Wants to leave hospital. Denied chest pain, nausea vomiting headache or dizziness. Denied shortness  of breath.  Discharge Exam: Vitals:   08/19/16 0729 08/19/16 1100  BP: 105/71 (!) 123/54  Pulse: (!) 104 100  Resp: 15 13  Temp: 99 F (37.2 C) 98.9 F (37.2 C)  SpO2: 91%    Vitals:   08/19/16 0523 08/19/16 0717 08/19/16 0729 08/19/16 1100  BP: 110/68 106/85 105/71 (!) 123/54  Pulse: (!) 106 (!) 111 (!) 104 100  Resp: (!) 21 19 15 13   Temp:  99.1 F (37.3 C) 99 F (37.2 C) 98.9 F (37.2 C)  TempSrc:  Oral Oral Oral  SpO2: 100% 90% 91%   Weight:      Height:        General: Pt is alert, awake, not in acute distress Cardiovascular: RRR, S1/S2 +, no rubs, no gallops Respiratory: Bilateral diffuse wheezing Abdominal: Soft, NT, ND, bowel sounds + Extremities: no edema, no cyanosis    The results of significant diagnostics from this hospitalization (including imaging, microbiology, ancillary and laboratory) are listed below for reference.     Microbiology: Recent Results (from the past 240 hour(s))  MRSA PCR Screening     Status: None   Collection Time: 08/19/16 10:58 AM  Result Value Ref Range Status   MRSA by PCR NEGATIVE NEGATIVE Final    Comment:        The GeneXpert MRSA Assay (FDA approved for NASAL specimens only), is one component of a comprehensive MRSA colonization surveillance program. It is not intended to diagnose MRSA infection nor to guide or monitor treatment for MRSA infections.      Labs: BNP (last 3 results)  Recent Labs  05/23/16 1205 06/18/16 1636 08/19/16 0827  BNP 119.1* 592.7* 27.7   Basic Metabolic Panel:  Recent Labs Lab 08/19/16 0102  NA 141  K 4.4  CL 99*  CO2 32  GLUCOSE 263*  BUN 15  CREATININE 1.37*  CALCIUM 9.3   Liver Function Tests: No results for input(s): AST, ALT, ALKPHOS, BILITOT, PROT, ALBUMIN in the last 168 hours. No results for input(s): LIPASE, AMYLASE in the last 168 hours. No results for input(s): AMMONIA in the last 168 hours. CBC:  Recent Labs Lab 08/19/16 0102  WBC 13.8*  NEUTROABS  10.5*  HGB 13.3  HCT 43.9  MCV 89.0  PLT 231   Cardiac Enzymes: No results for input(s): CKTOTAL, CKMB, CKMBINDEX, TROPONINI in the last 168 hours. BNP: Invalid input(s): POCBNP CBG:  Recent Labs Lab 08/19/16 0749  GLUCAP 484*   D-Dimer No results for input(s): DDIMER in the last 72 hours. Hgb A1c No results for input(s): HGBA1C in the last 72 hours. Lipid Profile No results for input(s): CHOL, HDL, LDLCALC, TRIG, CHOLHDL, LDLDIRECT in the last 72 hours. Thyroid function studies No results for input(s): TSH, T4TOTAL, T3FREE, THYROIDAB in the last 72 hours.  Invalid input(s): FREET3 Anemia work up No results for input(s): VITAMINB12, FOLATE, FERRITIN, TIBC, IRON, RETICCTPCT in the last 72 hours. Urinalysis    Component Value Date/Time   COLORURINE YELLOW 06/19/2016 1145   APPEARANCEUR HAZY (A) 06/19/2016 1145   LABSPEC 1.009 06/19/2016 1145   PHURINE 5.0 06/19/2016 1145   GLUCOSEU >=500 (A) 06/19/2016 1145   GLUCOSEU 250 (A) 03/16/2015 1612   HGBUR NEGATIVE 06/19/2016 1145   BILIRUBINUR NEGATIVE 06/19/2016 1145   KETONESUR NEGATIVE 06/19/2016 1145   PROTEINUR NEGATIVE 06/19/2016 1145   UROBILINOGEN 0.2 03/16/2015 1612   NITRITE NEGATIVE 06/19/2016 1145   LEUKOCYTESUR NEGATIVE 06/19/2016 1145   Sepsis Labs Invalid input(s): PROCALCITONIN,  WBC,  LACTICIDVEN Microbiology Recent Results (from the past 240 hour(s))  MRSA PCR Screening     Status: None   Collection Time: 08/19/16 10:58 AM  Result Value Ref Range Status   MRSA by PCR NEGATIVE NEGATIVE Final    Comment:        The GeneXpert MRSA Assay (FDA approved for NASAL specimens only), is one component of a comprehensive MRSA colonization surveillance program. It is not intended to diagnose MRSA infection nor to guide or monitor treatment for MRSA infections.     SIGNED:   Rosita Fire, MD  [facility] 08/19/2016, 1:39 PM  If 7PM-7AM, please contact  night-coverage [email or web address] Password [email or web address]

## 2016-08-19 NOTE — Progress Notes (Signed)
Pt blood sugar was 484, Dr. Carolin Sicks at bedside. Lantus 50 units has been ordered and am waiting on Pharmacy to draw it and tube it up. Pt was given 20 units of Novalog (max on the sliding scale per [facility]).

## 2016-08-19 NOTE — ED Notes (Signed)
Labs held at  Bedside.

## 2016-08-19 NOTE — Progress Notes (Signed)
PT Cancellation/Discharge Note  Patient Details Name: RAJANAE MANTIA MRN: [id number] DOB: April 05, 1965   Cancelled Treatment:    Reason Eval/Treat Not Completed: Other (comment) (Patient leaving AMA at this time.  PT will sign off.)   Despina Pole 08/19/2016, 11:40 AM Carita Pian. Sanjuana Kava, [name] Pager [phone]

## 2016-08-19 NOTE — Progress Notes (Signed)
Pt leaving AMA, form signed and MD notified. Pt called herself a taxi.

## 2016-08-19 NOTE — H&P (Addendum)
TRH H&P   Patient Demographics:    [name], is a 51 y.o. female  MRN: [id number]   DOB - 1965/10/06  Admit Date - 08/19/2016  Outpatient Primary MD for the patient is Michel Harrow, PA-C     Referring MD/NP/PA: Dr. Laverta Baltimore  Outpatient Specialists:    Dorris Carnes (cardiologist) Bobby Rumpf (ID) [facility]   Patient coming from:  home  Chief Complaint  Patient presents with  . Altered Mental Status  . Hyperglycemia      HPI:    [name]  is a 51 y.o. female, w   HIV, HTN, DM type II, obesity, s/p Lt BKA, asthma,  oxygen dependent of 2 L, tobacco abuse, probable OSA  who apparently earlier left AMA earlier today after having been admitted for Asthma exacerbation and went home.  She apparently called EMS cause her nebulizer was not working.  Pt felt " fuzzy " was having worsening of sob. Slight yellow nasal drainage, slight cough, mostly dry  She was taken by EMS back to ED for evaluation.   In ED, CXR => no acute abnormality.  CT brain negative.  Etoh <5,  Urine drug screen pending, Wbc 10.7, Hgb 12.2, Na 134, Glucose 504, Bun/creat 20/1.59 per ED physician there was some question of confusion.  pt will be admitted for w/up of asthma exacerbation and back pain    Review of systems:    In addition to the HPI above,  No Fever-chills, No Headache, No changes with Vision or hearing, No problems swallowing food or Liquids, No Chest pain No Abdominal pain, No Nausea or Vommitting, Bowel movements are regular, No Blood in stool or Urine, No dysuria, No new skin rashes or bruises, No new joints pains-aches,  No new weakness, tingling, numbness in any extremity, No recent weight gain or loss, No polyuria, polydypsia or polyphagia, No significant Mental Stressors.  A full 10 point Review of Systems was done, except as stated above, all  other Review of Systems were negative.   With Past History of the following :    Past Medical History:  Diagnosis Date  . Acute metabolic encephalopathy 02/26/7503  . Acute on chronic respiratory failure with hypoxia ([place]) 05/19/2016  . Anxiety   . ARF (acute renal failure) ([place]) 05/03/2014  . Arthritis    LEFT HIP  . Asthma    HOSPITALIZED WITH EXCERBATION OF ASTHMA - AND BRONCHITIS AND THE FLU DEC 2013  . Asthma exacerbation attacks 12/28/2011  . Bronchitis 12/28/2011  . CAP (community acquired pneumonia) 07/25/2012  . Chronic kidney disease (CKD), stage IV (severe) ([place]) 11/10/2014  . Class 3 obesity due to excess calories with serious comorbidity and body mass index (BMI) of 40.0 to 44.9 in adult [facility])   . Colles' fracture of left radius   . Depression   . Diabetes mellitus  ON INSULIN AND ORAL MEDICATIONS  . Diabetes mellitus type 2 with complications, uncontrolled ([place]) 04/19/2008   Qualifier: Diagnosis of  By: Tomma Lightning MD, Claiborne Billings     . Diabetic foot infection ([place]) 11/09/2014  . Diabetic ketoacidosis without coma associated with type 2 diabetes mellitus ([place])   . Diabetic neuropathy ([place]) 11/10/2014  . Diarrhea 08/30/2008   Qualifier: Diagnosis of  By: Tomma Lightning MD, Claiborne Billings    . Diastolic dysfunction 02/07/7410  . Difficult intravenous access   . DKA, type 2 ([place]) 04/04/2016  . Dyslipidemia 12/23/2008   Qualifier: Diagnosis of  By: Tomma Lightning MD, Claiborne Billings    . Essential hypertension 04/19/2008   Qualifier: Diagnosis of  By: Tomma Lightning MD, Claiborne Billings    . FATIGUE 07/12/2008   Qualifier: Diagnosis of  By: Tomma Lightning MD, Claiborne Billings    . Femoral neck fracture ([place]) 07/16/2011  . Femur fracture, left ([place]) 07/08/2014  . Fever   . Fracture of distal femur ([place]) 07/08/2014  . GERD (gastroesophageal reflux disease)   . Hereditary and idiopathic peripheral neuropathy 04/19/2008   Qualifier: Diagnosis of  By: Tomma Lightning MD, Claiborne Billings    . Hip fracture, left ([place]) 07/16/2011  . Hip pain   . HIV infection ([place]) 2000  . Human  immunodeficiency virus (HIV) disease ([place]) 04/19/2008   [id number] +   . Hyperlipidemia   . Hypertension   . Hyponatremia 12/29/2011  . Influenza A 12/29/2011  . Influenza B   . Insomnia 06/12/2016  . Left hip pain 05/03/2014  . Mood disorder ([place]) 04/19/2008   Qualifier: Diagnosis of  By: Tomma Lightning MD, Claiborne Billings    . Neuropathy    NEUROPATHY HANDS AND FEET  . NSTEMI (non-ST elevated myocardial infarction) ([place]) 05/19/2016  . Obesity hypoventilation syndrome ([place])   . Osteoporosis 07/08/2014  . Pain    SEVERE PAIN LEFT HIP - HX OF LEFT HIP PINNING JULY 2013  . Perimenopausal symptoms 02/13/2012   LMP around 2011. On estrace and provera since around 2012 for hot flashes, mood swings. Estrace 2 mg daily, Provera 2.5 mg for 5 days each month.   . Repeated falls 07/30/2011  . Sepsis ([place]) 04/04/2016  . Shortness of breath    ALLERGIES ARE "ACTING UP"  . SOB (shortness of breath)   . THRUSH 05/20/2008   Qualifier: Diagnosis of  By: Tomma Lightning MD, Claiborne Billings      . Tobacco use disorder 04/14/2010      Past Surgical History:  Procedure Laterality Date  . AMPUTATION Left 11/18/2014   Procedure: LEFT BELOW KNEE AMPUTATION;  Surgeon: Leandrew Koyanagi, MD;  Location: [facility];  Service: Orthopedics;  Laterality: Left;  . CAST APPLICATION Left 08/08/8674   Procedure: CAST APPLICATION (FIBERGLASS);  Surgeon: Latanya Maudlin, MD;  Location: [facility];  Service: Orthopedics;  Laterality: Left;  CLOSED REDUCTION OF LEFT COLLES FRACTURE WITH SHORT ARM CAST  . HARDWARE REMOVAL Left 05/05/2014   Procedure: HARDWARE REMOVAL LEFT HIP;  Surgeon: Latanya Maudlin, MD;  [facility];  Service: Orthopedics;  Laterality: Left;  REMOVAL BIOMET 6.5-8.0 CANNULATED SCREW  . HIP ARTHROPLASTY Left 05/05/2014   Procedure: ARTHROPLASTY OPEN REDUCTION INTERNAL FIXATION LEFT HIP AND REMOVAL OF TWO CANNULATED SCREW;  Surgeon: Latanya Maudlin, MD;  [facility];  Service: Orthopedics;  Laterality: Left;  . HIP PINNING,CANNULATED  07/16/2011   Procedure:  CANNULATED HIP PINNING;  Surgeon: Gearlean Alf, MD;  [facility];  Service: Orthopedics;  Laterality: Left;  . HIP PINNING,CANNULATED Left 04/09/2012   Procedure: CANNULATED HIP  PINNING AND HARDWARE REVISION;  Surgeon: Gearlean Alf, MD;  [facility];  Service: Orthopedics;  Laterality: Left;  . I&D EXTREMITY Left 11/16/2014   Procedure:  DEBRIDEMENT OF LEFT FOOT POSSIBLE APPLICATION OF INTEGRIA AND VAC ;  Surgeon: Irene Limbo, MD;  Location: [facility];  Service: Plastics;  Laterality: Left;  . PERIPHERAL VASCULAR CATHETERIZATION N/A 11/15/2014   Procedure: Abdominal Aortogram;  Surgeon: Conrad Sinton, MD;  Location: [facility];  Service: Cardiovascular;  Laterality: N/A;  . PERIPHERAL VASCULAR CATHETERIZATION  11/15/2014   Procedure: Lower Extremity Angiography;  Surgeon: Conrad Benoit, MD;  Location: [facility];  Service: Cardiovascular;;  . PERIPHERAL VASCULAR CATHETERIZATION Left 11/15/2014   Procedure: Peripheral Vascular Intervention;  Surgeon: Conrad , MD;  Location: [facility];  Service: Cardiovascular;  Laterality: Left;  popliteal artery stenting      Social History:     Social History  Substance Use Topics  . Smoking status: Current Every Day Smoker    Packs/day: 0.50    Years: 35.00    Types: E-cigarettes, Cigarettes  . Smokeless tobacco: Never Used  . Alcohol use No     Comment: no drink since 2008     Lives - at home  Mobility - walks by self   Family History :     Family History  Problem Relation Age of Onset  . Diabetes Mother   . Hypertension Mother   . Vision loss Mother   . Heart disease Mother   . Hypertension Father   . Thyroid disease Neg Hx       Home Medications:   Prior to Admission medications   Medication Sig Start Date End Date Taking? Authorizing Provider  budesonide (PULMICORT) 0.5 MG/2ML nebulizer solution Take 0.5 mg by nebulization 2 (two) times daily.   Yes [provider]    budesonide-formoterol (SYMBICORT) 80-4.5 MCG/ACT inhaler Inhale 2 puffs into the lungs 2 (two) times daily.   Yes [provider]  Insulin Glargine (TOUJEO SOLOSTAR) 300 UNIT/ML SOPN Inject 65 Units into the skin 2 (two) times daily. 07/29/16  Yes Elayne Snare, MD  insulin regular human CONCENTRATED (HUMULIN R U-500 KWIKPEN) 500 UNIT/ML kwikpen Inject 35-60 Units into the skin See admin instructions. Inject 60 units subcutaneously with breakfast, 50 units with lunch and 35 units with supper   Yes [provider]  ipratropium-albuterol (DUONEB) 0.5-2.5 (3) MG/3ML SOLN Take 3 mLs by nebulization 3 (three) times daily. Patient taking differently: Take 3 mLs by nebulization every 6 (six) hours as needed (shortness of breath/wheezing).  05/25/16  Yes Rai, Ripudeep K, MD  metFORMIN (GLUCOPHAGE-XR) 750 MG 24 hr tablet Take 750 mg by mouth daily with breakfast.  06/01/16  Yes [provider]  potassium chloride SA (K-DUR,KLOR-CON) 20 MEQ tablet Take 1 tablet (20 mEq total) by mouth daily. X 3 days, then check potassium levels if needed to continue Patient taking differently: Take 20 mEq by mouth daily.  05/25/16  Yes Rai, Ripudeep K, MD  pregabalin (LYRICA) 225 MG capsule Take 225 mg by mouth 2 (two) times daily.   Yes [provider]  acetaminophen (TYLENOL) 500 MG tablet Take 1,000 mg by mouth every 6 (six) hours as needed for mild pain or moderate pain.    [provider]  aspirin EC 81 MG EC tablet Take 1 tablet (81 mg total) by mouth daily. 05/25/16   Rai, Ripudeep Raliegh Ip, MD  atenolol (TENORMIN) 25 MG tablet TK  1 T PO QD 05/02/16   [provider]  Blood Glucose Monitoring Suppl (ACCU-CHEK AVIVA PLUS) w/Device KIT USE TO TEST BLOOD SUGAR DAILY 07/03/16   Elayne Snare, MD  Cholecalciferol (VITAMIN D3) 5000 UNITS TABS Take 5,000 Units by mouth daily.     [provider]  diphenoxylate-atropine (LOMOTIL) 2.5-0.025 MG tablet Take 1 tablet by mouth 4 (four)  times daily as needed for diarrhea or loose stools. 06/12/16   Campbell Riches, MD  Eszopiclone 3 MG TABS Take 1 tablet (3 mg total) by mouth at bedtime as needed. Take immediately before bedtime 06/12/16   Campbell Riches, MD  fenofibrate (TRICOR) 145 MG tablet TK 1 T PO QD 05/11/16   [provider]  furosemide (LASIX) 80 MG tablet TK 1 T PO QD 05/11/16   [provider]  GENVOYA 150-150-200-10 MG TABS tablet TAKE 1 TABLET BY MOUTH EVERY DAY 12/19/15   Campbell Riches, MD  glucose blood (ACCU-CHEK AVIVA) test strip Use to test blood sugar 3 times daily 07/03/16   Elayne Snare, MD  HUMULIN R U-500 KWIKPEN 500 UNIT/ML kwikpen Inject 35-60 Units into the skin. 60 units in the AM, 50 units at lunch, and 35 units at bedtime 05/11/16   [provider]  HYDROcodone-acetaminophen (NORCO) 10-325 MG tablet TK 1 T PO  Q 8 H 04/11/16   [provider]  Insulin Glargine (TOUJEO SOLOSTAR) 300 UNIT/ML SOPN Inject 50 Units into the skin 2 (two) times daily. Patient not taking: Reported on 08/19/2016 06/21/16   Elayne Snare, MD  nystatin (MYCOSTATIN) 100000 UNIT/ML suspension Take 5 mLs (500,000 Units total) by mouth 4 (four) times daily. 05/25/16   Rai, Ripudeep K, MD  pantoprazole (PROTONIX) 40 MG tablet Take 40 mg by mouth daily. 09/02/14   [provider]  rosuvastatin (CRESTOR) 20 MG tablet Take 20 mg by mouth daily. Reported on 05/02/2015 09/02/14   [provider]  tiZANidine (ZANAFLEX) 4 MG tablet Take 4 mg by mouth 2 (two) times daily as needed for muscle spasms.  03/06/16   [provider]  VICTOZA 18 MG/3ML SOPN ADM 1.8 MG [place] D 06/06/16   [provider]     Allergies:     Allergies  Allergen Reactions  . Cortizone-10 [Hydrocortisone] Rash    Per patient, given local injection at knee and developed rash at local site.      Physical Exam:   Vitals  Temperature 98.7 F (37.1 C), temperature source Oral, last menstrual period  11/16/2010, SpO2 95 %.   1. General  lying in bed in NAD,    2. Normal affect and insight, Not Suicidal or Homicidal, Awake Alert, Oriented X 3.  3. No F.N deficits, ALL C.Nerves Intact, Strength 5/5 all 4 extremities, Sensation intact all 4 extremities, Plantars down going.  4. Ears and Eyes appear Normal, Conjunctivae clear, PERRLA. Moist Oral Mucosa.  5. Supple Neck, No JVD, No cervical lymphadenopathy appriciated, No Carotid Bruits.  6.   + bilateral wheezing, no crackles  7. Tachy S1, S2 no m/g/r   8. Positive Bowel Sounds, Abdomen Soft, No tenderness, No organomegaly appriciated,No rebound -guarding or rigidity.  9.  No Cyanosis, Normal Skin Turgor, No Skin Rash or Bruise.  10. Good muscle tone,  joints appear normal , no effusions, Normal ROM.  11. No Palpable Lymph Nodes in Neck or Axillae  L BKA    Data Review:    CBC  Recent Labs Lab 08/19/16 0102 08/19/16  1744  WBC 13.8* 10.7*  HGB 13.3 12.2  HCT 43.9 39.0  PLT 231 273  MCV 89.0 85.9  MCH 27.0 26.9  MCHC 30.3 31.3  RDW 20.8* 20.6*  LYMPHSABS 2.2 0.6*  MONOABS 0.8 0.3  EOSABS 0.3 0.0  BASOSABS 0.0 0.0   ------------------------------------------------------------------------------------------------------------------  Chemistries   Recent Labs Lab 08/19/16 0102 08/19/16 1744  NA 141 134*  K 4.4 4.6  CL 99* 95*  CO2 32 26  GLUCOSE 263* 504*  BUN 15 20  CREATININE 1.37* 1.59*  CALCIUM 9.3 9.6  AST  --  18  ALT  --  14  ALKPHOS  --  62  BILITOT  --  0.9   ------------------------------------------------------------------------------------------------------------------ estimated creatinine clearance is 52.3 mL/min (A) (by C-G formula based on SCr of 1.59 mg/dL (H)). ------------------------------------------------------------------------------------------------------------------ No results for input(s): TSH, T4TOTAL, T3FREE, THYROIDAB in the last 72 hours.  Invalid input(s):  FREET3  Coagulation profile No results for input(s): INR, PROTIME in the last 168 hours. ------------------------------------------------------------------------------------------------------------------- No results for input(s): DDIMER in the last 72 hours. -------------------------------------------------------------------------------------------------------------------  Cardiac Enzymes No results for input(s): CKMB, TROPONINI, MYOGLOBIN in the last 168 hours.  Invalid input(s): CK ------------------------------------------------------------------------------------------------------------------    Component Value Date/Time   BNP 65.5 08/19/2016 0827     ---------------------------------------------------------------------------------------------------------------  Urinalysis    Component Value Date/Time   COLORURINE YELLOW 06/19/2016 1145   APPEARANCEUR HAZY (A) 06/19/2016 1145   LABSPEC 1.009 06/19/2016 1145   PHURINE 5.0 06/19/2016 1145   GLUCOSEU >=500 (A) 06/19/2016 1145   GLUCOSEU 250 (A) 03/16/2015 1612   HGBUR NEGATIVE 06/19/2016 1145   BILIRUBINUR NEGATIVE 06/19/2016 1145   KETONESUR NEGATIVE 06/19/2016 1145   PROTEINUR NEGATIVE 06/19/2016 1145   UROBILINOGEN 0.2 03/16/2015 1612   NITRITE NEGATIVE 06/19/2016 1145   LEUKOCYTESUR NEGATIVE 06/19/2016 1145    ----------------------------------------------------------------------------------------------------------------   Imaging Results:    Dg Chest 2 View  Result Date: 08/19/2016 CLINICAL DATA:  Shortness of breath for several months EXAM: CHEST  2 VIEW COMPARISON:  08/19/2016 FINDINGS: Cardiac shadow is mildly enlarged but stable. The lungs are well aerated bilaterally. Old rib fractures are noted on the right with healing. No infiltrate or sizable effusion is noted. No acute bony abnormality is noted. IMPRESSION: No acute abnormality noted. Electronically Signed   By: Inez Catalina M.D.   On: 08/19/2016 18:48    Dg Chest Port 1 View  Result Date: 08/19/2016 CLINICAL DATA:  Shortness of breath EXAM: PORTABLE CHEST 1 VIEW COMPARISON:  06/18/2016 FINDINGS: Low lung volumes. No focal consolidation or effusion. Borderline cardiomegaly. No pneumothorax. IMPRESSION: Low lung volumes with borderline cardiomegaly. No edema or infiltrate. Electronically Signed   By: Donavan Foil M.D.   On: 08/19/2016 01:20   ekg st at 115, nl axis, nl int, no st-t changes c/w ischemia   Assessment & Plan:    Principal Problem:   Altered mental status Active Problems:   Diabetes mellitus type 2 with complications, uncontrolled ([name])   Hyponatremia   Chronic kidney disease (CKD), stage IV (severe) ([name])   Asthma exacerbation   Hyperglycemia  Asthma exacerbation, Hx of Co2 retention Solumedrol 2m iv q8h Breo 1puff qday Duoneb q6h and prn zithromax for bronchitis Bipap overnite ABG in am  Tachycardiac Trop I q6h x3 Consider echo if persistent  Leukocytosis wbc=10.7 Likely due to recent steroids Check cbc in am  Lactic acidosis STOP METFORMIN due to renal insufficiency   ARF on CRF (renal insufficiency) likely due to diabetic/hypertensive nephropathy Hydrate gently with  ns iv Check renal ultrasound Check cmp in am  Hyponatremia, (pseudohyponatremia) Check cmp in am  Hyperglycemia (bs 504), Dm2 fsbs ac and qhs, ISS Cont toujeo 65 units [place] bid, cont victoza  Hyperlipidemia Cont crestor Tricor=> fenofibrate,  Decrease dose to 2m po qday due to renal insufficiency NOTE increase risk of rhabo w statin, fibrate combination Consider VASCEPA on discharge in place of fibrate  Chronic back pain No MRI found in EPIC Review of NCCSRS shows  Norco 10/3228m#90, on 07/27/2016,  By LuDene Gentryyrica [id number], rf1 on 07/10/2016 by ThoLavada Mesicrease Lyrica dose to 100m65m bid due to renal insufficiency  Anxiety Quick Review of NCCSRS shows Clonazepam 90.5mg 4m on 08/16/2016 by KenneBernita Raisinomnia Quick Review of [place] Temazepam 30mg 18m  08/16/2016 ,Temazepam 15mg  69m/2018, by KennethShanon Brow025m8/05/2016 , 07/09/2016, by Jeffrey Bobby Rumpf Prophylaxis heparin- SCDs  AM Labs Ordered, also please review Full Orders  Family Communication: Admission, patients condition and plan of care including tests being ordered have been discussed with the patient who indicate understanding and agree with the plan and Code Status.  Code Status  FULL CODE  Likely DC to  home  Condition GUARDED    Consults called: none  Admission status: observation   Time spent in minutes : 45 minutes   Erin Deltoro KiJani Gravel8/19/2018 at 8:33 PM  Between 7am to 7pm - Pager - [phone] 7pm go to [email or web address] - password [email or web address]  [facility] - Office  [phone]

## 2016-08-19 NOTE — ED Notes (Signed)
Attempted report x 1 to 2c

## 2016-08-20 ENCOUNTER — Encounter (HOSPITAL_COMMUNITY): Payer: Self-pay | Admitting: General Practice

## 2016-08-20 ENCOUNTER — Observation Stay (HOSPITAL_COMMUNITY): Payer: Medicare HMO

## 2016-08-20 DIAGNOSIS — J4541 Moderate persistent asthma with (acute) exacerbation: Secondary | ICD-10-CM

## 2016-08-20 DIAGNOSIS — R739 Hyperglycemia, unspecified: Secondary | ICD-10-CM | POA: Diagnosis not present

## 2016-08-20 DIAGNOSIS — E1165 Type 2 diabetes mellitus with hyperglycemia: Secondary | ICD-10-CM

## 2016-08-20 DIAGNOSIS — J9612 Chronic respiratory failure with hypercapnia: Secondary | ICD-10-CM | POA: Diagnosis not present

## 2016-08-20 DIAGNOSIS — N289 Disorder of kidney and ureter, unspecified: Secondary | ICD-10-CM | POA: Diagnosis not present

## 2016-08-20 DIAGNOSIS — Z794 Long term (current) use of insulin: Secondary | ICD-10-CM | POA: Diagnosis not present

## 2016-08-20 DIAGNOSIS — N184 Chronic kidney disease, stage 4 (severe): Secondary | ICD-10-CM

## 2016-08-20 DIAGNOSIS — E118 Type 2 diabetes mellitus with unspecified complications: Secondary | ICD-10-CM

## 2016-08-20 LAB — CBG MONITORING, ED
GLUCOSE-CAPILLARY: 444 mg/dL — AB (ref 65–99)
GLUCOSE-CAPILLARY: 425 mg/dL — AB (ref 65–99)
Glucose-Capillary: 406 mg/dL — ABNORMAL HIGH (ref 65–99)
Glucose-Capillary: 371 mg/dL — ABNORMAL HIGH (ref 65–99)
Glucose-Capillary: 313 mg/dL — ABNORMAL HIGH (ref 65–99)

## 2016-08-20 LAB — COMPREHENSIVE METABOLIC PANEL
ALK PHOS: 56 U/L (ref 38–126)
ALT: 14 U/L (ref 14–54)
AST: 13 U/L — AB (ref 15–41)
Albumin: 3 g/dL — ABNORMAL LOW (ref 3.5–5.0)
Anion gap: 12 (ref 5–15)
BILIRUBIN TOTAL: 0.8 mg/dL (ref 0.3–1.2)
BUN: 22 mg/dL — AB (ref 6–20)
CO2: 26 mmol/L (ref 22–32)
CREATININE: 1.43 mg/dL — AB (ref 0.44–1.00)
Calcium: 9.1 mg/dL (ref 8.9–10.3)
Chloride: 100 mmol/L — ABNORMAL LOW (ref 101–111)
GFR calc Af Amer: 48 mL/min — ABNORMAL LOW (ref >60)
GFR, EST NON AFRICAN AMERICAN: 41 mL/min — AB (ref >60)
Glucose, Bld: 438 mg/dL — ABNORMAL HIGH (ref 65–99)
POTASSIUM: 4.2 mmol/L (ref 3.5–5.1)
Sodium: 138 mmol/L (ref 135–145)
TOTAL PROTEIN: 6.6 g/dL (ref 6.5–8.1)

## 2016-08-20 LAB — CBC
HCT: 37.9 % (ref 36.0–46.0)
Hemoglobin: 11.7 g/dL — ABNORMAL LOW (ref 12.0–15.0)
MCH: 26.4 pg (ref 26.0–34.0)
MCHC: 30.9 g/dL (ref 30.0–36.0)
MCV: 85.6 fL (ref 78.0–100.0)
PLATELETS: 276 10*3/uL (ref 150–400)
RBC: 4.43 MIL/uL (ref 3.87–5.11)
RDW: 21.1 % — AB (ref 11.5–15.5)
WBC: 15.3 10*3/uL — AB (ref 4.0–10.5)

## 2016-08-20 LAB — TSH: TSH: 0.259 u[IU]/mL — AB (ref 0.350–4.500)

## 2016-08-20 LAB — GLUCOSE, CAPILLARY
GLUCOSE-CAPILLARY: 278 mg/dL — AB (ref 65–99)
Glucose-Capillary: 364 mg/dL — ABNORMAL HIGH (ref 65–99)

## 2016-08-20 LAB — T4, FREE: FREE T4: 1.07 ng/dL (ref 0.61–1.12)

## 2016-08-20 LAB — LACTIC ACID, PLASMA: LACTIC ACID, VENOUS: 2 mmol/L — AB (ref 0.5–1.9)

## 2016-08-20 MED ORDER — TIZANIDINE HCL 4 MG PO TABS
4.0000 mg | ORAL_TABLET | Freq: Every day | ORAL | Status: DC
  Administered 2016-08-20 – 2016-08-21 (×2): 4 mg via ORAL
  Filled 2016-08-20 (×2): qty 1

## 2016-08-20 MED ORDER — PREGABALIN 100 MG PO CAPS
100.0000 mg | ORAL_CAPSULE | Freq: Two times a day (BID) | ORAL | Status: DC
  Administered 2016-08-20 – 2016-08-21 (×4): 100 mg via ORAL
  Filled 2016-08-20: qty 1
  Filled 2016-08-20: qty 4
  Filled 2016-08-20 (×2): qty 1

## 2016-08-20 MED ORDER — SODIUM CHLORIDE 0.9 % IV SOLN
INTRAVENOUS | Status: DC
  Administered 2016-08-20: 03:00:00 via INTRAVENOUS

## 2016-08-20 MED ORDER — ASPIRIN EC 81 MG PO TBEC
81.0000 mg | DELAYED_RELEASE_TABLET | Freq: Every day | ORAL | Status: DC
  Administered 2016-08-20 – 2016-08-21 (×2): 81 mg via ORAL
  Filled 2016-08-20 (×2): qty 1

## 2016-08-20 MED ORDER — HYDROCODONE-ACETAMINOPHEN 10-325 MG PO TABS: 1.0000 | ORAL_TABLET | Freq: Three times a day (TID) | ORAL | Status: DC | PRN

## 2016-08-20 MED ORDER — HEPARIN SODIUM (PORCINE) 5000 UNIT/ML IJ SOLN
5000.0000 [IU] | Freq: Three times a day (TID) | INTRAMUSCULAR | Status: DC
  Administered 2016-08-20 – 2016-08-21 (×5): 5000 [IU] via SUBCUTANEOUS
  Filled 2016-08-20 (×6): qty 1

## 2016-08-20 MED ORDER — FLUTICASONE FUROATE-VILANTEROL 100-25 MCG/INH IN AEPB
1.0000 | INHALATION_SPRAY | Freq: Every day | RESPIRATORY_TRACT | Status: DC
  Administered 2016-08-20: 1 via RESPIRATORY_TRACT
  Filled 2016-08-20 (×2): qty 28

## 2016-08-20 MED ORDER — INSULIN GLARGINE 100 UNIT/ML ~~LOC~~ SOLN
65.0000 [IU] | Freq: Two times a day (BID) | SUBCUTANEOUS | Status: DC
  Administered 2016-08-20 – 2016-08-21 (×3): 65 [IU] via SUBCUTANEOUS
  Filled 2016-08-20 (×4): qty 0.65

## 2016-08-20 MED ORDER — ROSUVASTATIN CALCIUM 20 MG PO TABS
20.0000 mg | ORAL_TABLET | Freq: Every day | ORAL | Status: DC
  Administered 2016-08-20 – 2016-08-21 (×2): 20 mg via ORAL
  Filled 2016-08-20 (×2): qty 1
  Filled 2016-08-20: qty 2

## 2016-08-20 MED ORDER — VITAMIN D 1000 UNITS PO TABS
5000.0000 [IU] | ORAL_TABLET | Freq: Every day | ORAL | Status: DC
  Administered 2016-08-20 – 2016-08-21 (×2): 5000 [IU] via ORAL
  Filled 2016-08-20 (×2): qty 5

## 2016-08-20 MED ORDER — PANTOPRAZOLE SODIUM 40 MG PO TBEC
40.0000 mg | DELAYED_RELEASE_TABLET | Freq: Every day | ORAL | Status: DC
  Administered 2016-08-20 – 2016-08-21 (×2): 40 mg via ORAL
  Filled 2016-08-20 (×2): qty 1

## 2016-08-20 MED ORDER — INSULIN ASPART 100 UNIT/ML ~~LOC~~ SOLN
0.0000 [IU] | Freq: Every day | SUBCUTANEOUS | Status: DC
  Administered 2016-08-20 (×2): 5 [IU] via SUBCUTANEOUS
  Filled 2016-08-20: qty 1

## 2016-08-20 MED ORDER — TEMAZEPAM 15 MG PO CAPS
30.0000 mg | ORAL_CAPSULE | Freq: Every day | ORAL | Status: DC
  Administered 2016-08-20 (×2): 30 mg via ORAL
  Filled 2016-08-20 (×2): qty 2

## 2016-08-20 MED ORDER — ACETAMINOPHEN 325 MG PO TABS
650.0000 mg | ORAL_TABLET | Freq: Four times a day (QID) | ORAL | Status: DC | PRN
  Administered 2016-08-20: 650 mg via ORAL
  Filled 2016-08-20: qty 2

## 2016-08-20 MED ORDER — SODIUM CHLORIDE 0.9 % IV SOLN
INTRAVENOUS | Status: DC
  Administered 2016-08-20: 05:00:00 via INTRAVENOUS

## 2016-08-20 MED ORDER — CLONAZEPAM 0.5 MG PO TABS
0.5000 mg | ORAL_TABLET | Freq: Three times a day (TID) | ORAL | Status: DC | PRN
  Administered 2016-08-20 – 2016-08-21 (×2): 0.5 mg via ORAL
  Filled 2016-08-20 (×2): qty 1

## 2016-08-20 MED ORDER — IPRATROPIUM-ALBUTEROL 0.5-2.5 (3) MG/3ML IN SOLN
3.0000 mL | Freq: Four times a day (QID) | RESPIRATORY_TRACT | Status: DC | PRN
  Filled 2016-08-20: qty 3

## 2016-08-20 MED ORDER — METHYLPREDNISOLONE SODIUM SUCC 125 MG IJ SOLR
80.0000 mg | Freq: Two times a day (BID) | INTRAMUSCULAR | Status: DC
  Administered 2016-08-20 – 2016-08-21 (×2): 80 mg via INTRAVENOUS
  Filled 2016-08-20 (×2): qty 2

## 2016-08-20 MED ORDER — SODIUM CHLORIDE 0.9 % IV SOLN
INTRAVENOUS | Status: DC
  Administered 2016-08-20 – 2016-08-21 (×2): via INTRAVENOUS

## 2016-08-20 MED ORDER — LIRAGLUTIDE 18 MG/3ML ~~LOC~~ SOPN
1.8000 mg | PEN_INJECTOR | Freq: Every day | SUBCUTANEOUS | Status: DC
Start: 2016-08-20 — End: 2016-08-21

## 2016-08-20 MED ORDER — ACETAMINOPHEN 650 MG RE SUPP: 650.0000 mg | Freq: Four times a day (QID) | RECTAL | Status: DC | PRN

## 2016-08-20 MED ORDER — DULOXETINE HCL 60 MG PO CPEP
60.0000 mg | ORAL_CAPSULE | Freq: Every day | ORAL | Status: DC
  Administered 2016-08-20 – 2016-08-21 (×2): 60 mg via ORAL
  Filled 2016-08-20 (×3): qty 1

## 2016-08-20 MED ORDER — INSULIN ASPART 100 UNIT/ML ~~LOC~~ SOLN
0.0000 [IU] | Freq: Three times a day (TID) | SUBCUTANEOUS | Status: DC
  Administered 2016-08-20: 9 [IU] via SUBCUTANEOUS
  Administered 2016-08-20: 7 [IU] via SUBCUTANEOUS
  Administered 2016-08-20: 5 [IU] via SUBCUTANEOUS
  Administered 2016-08-21: 7 [IU] via SUBCUTANEOUS
  Filled 2016-08-20 (×2): qty 1

## 2016-08-20 MED ORDER — MAGNESIUM OXIDE 400 (241.3 MG) MG PO TABS
400.0000 mg | ORAL_TABLET | Freq: Every day | ORAL | Status: DC
  Administered 2016-08-20 – 2016-08-21 (×2): 400 mg via ORAL
  Filled 2016-08-20 (×3): qty 1

## 2016-08-20 MED ORDER — ARIPIPRAZOLE 10 MG PO TABS
10.0000 mg | ORAL_TABLET | Freq: Every day | ORAL | Status: DC
  Administered 2016-08-20 – 2016-08-21 (×2): 10 mg via ORAL
  Filled 2016-08-20 (×2): qty 1

## 2016-08-20 MED ORDER — ELVITEG-COBIC-EMTRICIT-TENOFAF 150-150-200-10 MG PO TABS
1.0000 | ORAL_TABLET | Freq: Every day | ORAL | Status: DC
  Administered 2016-08-20 – 2016-08-21 (×2): 1 via ORAL
  Filled 2016-08-20 (×3): qty 1

## 2016-08-20 MED ORDER — ATENOLOL 25 MG PO TABS: 25.0000 mg | ORAL_TABLET | Freq: Every day | ORAL | Status: DC

## 2016-08-20 MED ORDER — FENOFIBRATE 54 MG PO TABS
54.0000 mg | ORAL_TABLET | Freq: Every day | ORAL | Status: DC
  Administered 2016-08-20 – 2016-08-21 (×2): 54 mg via ORAL
  Filled 2016-08-20 (×3): qty 1

## 2016-08-20 NOTE — ED Notes (Signed)
Patient called out multiple times regarding her IV site - pt states it is hurting and requests it be taken out. IV flushes without difficulty, no redness or swelling noted at site. RN attempted to slow infusion down and pausing it, but 5 minutes after pausing, patient calls out again for me to take it out.

## 2016-08-20 NOTE — Progress Notes (Signed)
Patient refused bipap for the night

## 2016-08-20 NOTE — ED Notes (Signed)
Patient transported to US 

## 2016-08-20 NOTE — Progress Notes (Signed)
Report received from [facility] in the ED. Pt arrive at the unit at 1500.

## 2016-08-20 NOTE — ED Notes (Signed)
Patient states is upset about the wait and is still in pain and wants to go home.  Patient states IV came out by accident but she had pulled all of monitoring off to be discharged. RN apologetic and updated patient about wait and room assignment and plan of care. Patient states she will wait- RRT at bedside to draw arterial blood gas.

## 2016-08-20 NOTE — Progress Notes (Signed)
RT attempted an ABG without success. RN aware. RT will continue to monitor.

## 2016-08-20 NOTE — Progress Notes (Signed)
PROGRESS NOTE    [name]  OVZ:[id number] DOB: [birth year] DOA: 08/19/2016 PCP: Michel Harrow, PA-C   Outpatient Specialists:     Brief Narrative:  [name]  is a 51 y.o. female, w  HIV, HTN, DM type II, obesity, s/p Lt BKA,asthma,  oxygen dependent of2 L, tobacco abuse, probable OSA  who apparently earlier left AMA earlier today after having been admitted for Asthma exacerbation and went home.  She apparently called EMS cause her nebulizer was not working.  Pt felt " fuzzy " was having worsening of sob. Slight yellow nasal drainage, slight cough, mostly dry  She was taken by EMS back to ED for evaluation.   In ED, CXR => no acute abnormality.  CT brain negative.  Etoh <5,  Urine drug screen pending, Wbc 10.7, Hgb 12.2, Na 134, Glucose 504, Bun/creat 20/1.59 per ED physician there was some question of confusion.  pt will be admitted for w/up of asthma exacerbation and back pain   Assessment & Plan:   Principal Problem:   Asthma exacerbation Active Problems:   Diabetes mellitus type 2 with complications, uncontrolled ([name])   Hyponatremia   Chronic kidney disease (CKD), stage IV (severe) ([name])   Altered mental status   Hyperglycemia   Asthma exacerbation, Hx of Co2 retention- keep O2 sats between 90-92% Solumedrol 80mg  iv q12 Breo 1puff qday Duoneb q6h and prn zithromax for bronchitis Bipap QHS- needs outpatient sleep study ABG not done this am as ordered -unable to do CTA-- will consider V/Q scan   Hypotension -unclear etiology -? Low volume from elevated blood sugars -IVF and hold BP medications  Tachycardiac -suspect related to breathing issues -recent echo has been done in May  Leukocytosis wbc=10.7 Likely due to recent steroids trend  Lactic acidosis STOP METFORMIN due to renal insufficiency -recheck lactic acid  ARF on CRF (renal insufficiency) likely due to diabetic/hypertensive nephropathy Hydrate gently with ns  iv trend  Hyperglycemia (bs 504), Dm2 -worsened by steroids Cont toujeo 65 units [place] bid, cont victoza  Hyperlipidemia Cont crestor [place]=> fenofibrate,  Decrease dose to 54mg  po qday due to renal insufficiency NOTE increase risk of rhabo w statin, fibrate combination Consider VASCEPA on discharge in place of fibrate  Chronic back pain No MRI found in EPIC Review of [facility] shows  Norco 10/325mg  #90, on 07/27/2016,  By Dene Gentry FNP Lyrica 225mg  #50, rf1 on 07/10/2016 by Lavada Mesi Decrease Lyrica dose to 100mg  po bid due to renal insufficiency  Anxiety Quick Review of [facility] shows Clonazepam 90.5mg  #90 on 08/16/2016 by Bernita Raisin  Insomnia Quick Review of [place] shows Temazepam 30mg  #30,  08/16/2016 ,Temazepam 15mg   07/18/2016, by Shanon Brow 3mg  #30 , 08/05/2016 , 07/09/2016, by Bobby Rumpf,    DVT prophylaxis:  SQ Heparin  Code Status: Full Code   Family Communication:   Disposition Plan:     Consultants:        Subjective: Left AMA yesterday but back the same day  Objective: Vitals:   08/20/16 1022 08/20/16 1043 08/20/16 1221 08/20/16 1228  BP: (!) 80/62 91/65 (!) 136/118 (!) 80/61  Pulse: (!) 103 82 (!) 108   Resp: (!) 26 18 (!) 25   Temp:      TempSrc:      SpO2: 93% 92% 94%     Intake/Output Summary (Last 24 hours) at 08/20/16 1254 Last data filed at 08/20/16 0446  Gross per 24 hour  Intake  1090.83 ml  Output                0 ml  Net          1090.83 ml   There were no vitals filed for this visit.  Examination:  General exam: chronically ill appearing Respiratory system: diminished, occasional wheeze Cardiovascular system: tachy-- appears regular Gastrointestinal system: obese Central nervous system: alert, NAD Missing left leg below the knee    Data Reviewed: I have personally reviewed following labs and imaging studies  CBC:  Recent Labs Lab 08/19/16 0102 08/19/16 1744 08/20/16 0437   WBC 13.8* 10.7* 15.3*  NEUTROABS 10.5* 9.8*  --   HGB 13.3 12.2 11.7*  HCT 43.9 39.0 37.9  MCV 89.0 85.9 85.6  PLT 231 273 283   Basic Metabolic Panel:  Recent Labs Lab 08/19/16 0102 08/19/16 1744 08/20/16 0437  NA 141 134* 138  K 4.4 4.6 4.2  CL 99* 95* 100*  CO2 32 26 26  GLUCOSE 263* 504* 438*  BUN 15 20 22*  CREATININE 1.37* 1.59* 1.43*  CALCIUM 9.3 9.6 9.1   GFR: Estimated Creatinine Clearance: 58.2 mL/min (A) (by C-G formula based on SCr of 1.43 mg/dL (H)). Liver Function Tests:  Recent Labs Lab 08/19/16 1744 08/20/16 0437  AST 18 13*  ALT 14 14  ALKPHOS 62 56  BILITOT 0.9 0.8  PROT 7.3 6.6  ALBUMIN 3.1* 3.0*   No results for input(s): LIPASE, AMYLASE in the last 168 hours.  Recent Labs Lab 08/19/16 1809  AMMONIA 30   Coagulation Profile: No results for input(s): INR, PROTIME in the last 168 hours. Cardiac Enzymes: No results for input(s): CKTOTAL, CKMB, CKMBINDEX, TROPONINI in the last 168 hours. BNP (last 3 results) No results for input(s): PROBNP in the last 8760 hours. HbA1C: No results for input(s): HGBA1C in the last 72 hours. CBG:  Recent Labs Lab 08/20/16 0226 08/20/16 0454 08/20/16 0654 08/20/16 0730 08/20/16 1159  GLUCAP 444* 425* 406* 371* 313*   Lipid Profile: No results for input(s): CHOL, HDL, LDLCALC, TRIG, CHOLHDL, LDLDIRECT in the last 72 hours. Thyroid Function Tests:  Recent Labs  08/20/16 0437  TSH 0.259*   Anemia Panel: No results for input(s): VITAMINB12, FOLATE, FERRITIN, TIBC, IRON, RETICCTPCT in the last 72 hours. Urine analysis:    Component Value Date/Time   COLORURINE YELLOW 08/19/2016 1755   APPEARANCEUR HAZY (A) 08/19/2016 1755   LABSPEC 1.029 08/19/2016 1755   PHURINE 6.0 08/19/2016 1755   GLUCOSEU >=500 (A) 08/19/2016 1755   GLUCOSEU 250 (A) 03/16/2015 1612   HGBUR NEGATIVE 08/19/2016 1755   BILIRUBINUR NEGATIVE 08/19/2016 1755   KETONESUR 20 (A) 08/19/2016 1755   PROTEINUR NEGATIVE  08/19/2016 1755   UROBILINOGEN 0.2 03/16/2015 1612   NITRITE NEGATIVE 08/19/2016 1755   LEUKOCYTESUR NEGATIVE 08/19/2016 1755   ) Recent Results (from the past 240 hour(s))  MRSA PCR Screening     Status: None   Collection Time: 08/19/16 10:58 AM  Result Value Ref Range Status   MRSA by PCR NEGATIVE NEGATIVE Final    Comment:        The GeneXpert MRSA Assay (FDA approved for NASAL specimens only), is one component of a comprehensive MRSA colonization surveillance program. It is not intended to diagnose MRSA infection nor to guide or monitor treatment for MRSA infections.   Culture, blood (routine x 2)     Status: None (Preliminary result)   Collection Time: 08/19/16  5:44 PM  Result Value Ref  Range Status   Specimen Description BLOOD LEFT ANTECUBITAL  Final   Special Requests   Final    BOTTLES DRAWN AEROBIC AND ANAEROBIC Blood Culture adequate volume   Culture NO GROWTH < 24 HOURS  Final   Report Status PENDING  Incomplete  Culture, blood (routine x 2)     Status: None (Preliminary result)   Collection Time: 08/19/16  6:09 PM  Result Value Ref Range Status   Specimen Description BLOOD LEFT WRIST  Final   Special Requests   Final    BOTTLES DRAWN AEROBIC AND ANAEROBIC Blood Culture adequate volume   Culture NO GROWTH < 24 HOURS  Final   Report Status PENDING  Incomplete      Anti-infectives    Start     Dose/Rate Route Frequency Ordered Stop   08/20/16 1200  elvitegravir-cobicistat-emtricitabine-tenofovir (GENVOYA) 150-150-200-10 MG tablet 1 tablet     1 tablet Oral Daily 08/20/16 0042     08/19/16 2200  azithromycin (ZITHROMAX) 500 mg in dextrose 5 % 250 mL IVPB     500 mg 250 mL/hr over 60 Minutes Intravenous Every 24 hours 08/19/16 2138         Radiology Studies: Dg Chest 2 View  Result Date: 08/19/2016 CLINICAL DATA:  Shortness of breath for several months EXAM: CHEST  2 VIEW COMPARISON:  08/19/2016 FINDINGS: Cardiac shadow is mildly enlarged but stable. The  lungs are well aerated bilaterally. Old rib fractures are noted on the right with healing. No infiltrate or sizable effusion is noted. No acute bony abnormality is noted. IMPRESSION: No acute abnormality noted. Electronically Signed   By: Inez Catalina M.D.   On: 08/19/2016 18:48   Ct Head Wo Contrast  Result Date: 08/19/2016 CLINICAL DATA:  Altered mental status with disorientation EXAM: CT HEAD WITHOUT CONTRAST TECHNIQUE: Contiguous axial images were obtained from the base of the skull through the vertex without intravenous contrast. COMPARISON:  May 20, 2016 FINDINGS: Brain: There is mild atrophy for age. There is no intracranial mass, hemorrhage, extra-axial fluid collection, or midline shift. There is slight small vessel disease in the centra semiovale bilaterally, stable. Elsewhere gray-white compartments are normal. No evident acute infarct. Vascular: There is no appreciable hyperdense vessel. There are foci of calcification in each carotid siphon region. Skull: The bony calvarium appears intact. There is a tiny metallic foreign body in thescalp at the vertex level anteriorly. Sinuses/Orbits: There is mild mucosal thickening in the inferior right maxillary antrum. There is mild mucosal thickening in several ethmoid air cells bilaterally. Other paranasal sinuses are clear. Orbits appear symmetric bilaterally. Other: Mastoid air cells are clear. IMPRESSION: Mild atrophy for age. Slight periventricular small vessel disease. No intracranial mass, hemorrhage, or extra-axial fluid collection. No evident acute infarct. Foci of arterial vascular calcification noted. Mild paranasal sinus disease. Tiny metallic foreign body in the scalp at the anterior vertex. Electronically Signed   By: Lowella Grip III M.D.   On: 08/19/2016 20:50   US Renal  Result Date: 08/20/2016 CLINICAL DATA:  Renal insufficiency and altered mental status. EXAM: RENAL / URINARY TRACT ULTRASOUND COMPLETE COMPARISON:  05/03/2014 CT  FINDINGS: Right Kidney: Length: 11.1 cm. Echogenicity within normal limits. No mass or hydronephrosis visualized. Left Kidney: Length: 10.9 cm. Echogenicity within normal limits. No mass or hydronephrosis visualized. Bladder: Appears normal for degree of bladder distention. IMPRESSION: Maintained cortical-medullary distinction involving both kidneys without obstructive uropathy. No sonographic findings to explain the patient's renal insufficiency. Electronically Signed   By: Shanon Brow  Randel Pigg M.D.   On: 08/20/2016 01:33   Dg Chest Port 1 View  Result Date: 08/19/2016 CLINICAL DATA:  Shortness of breath EXAM: PORTABLE CHEST 1 VIEW COMPARISON:  06/18/2016 FINDINGS: Low lung volumes. No focal consolidation or effusion. Borderline cardiomegaly. No pneumothorax. IMPRESSION: Low lung volumes with borderline cardiomegaly. No edema or infiltrate. Electronically Signed   By: Donavan Foil M.D.   On: 08/19/2016 01:20        Scheduled Meds: . ARIPiprazole  10 mg Oral Daily  . aspirin EC  81 mg Oral Daily  . cholecalciferol  5,000 Units Oral Daily  . DULoxetine  60 mg Oral Daily  . elvitegravir-cobicistat-emtricitabine-tenofovir  1 tablet Oral Daily  . fenofibrate  54 mg Oral Daily  . fluticasone furoate-vilanterol  1 puff Inhalation Daily  . heparin  5,000 Units Subcutaneous Q8H  . insulin aspart  0-5 Units Subcutaneous QHS  . insulin aspart  0-9 Units Subcutaneous TID WC  . insulin glargine  65 Units Subcutaneous BID  . ipratropium-albuterol  3 mL Nebulization Q6H  . liraglutide  1.8 mg Subcutaneous Daily  . magnesium oxide  400 mg Oral Daily  . methylPREDNISolone (SOLU-MEDROL) injection  80 mg Intravenous Q12H  . pantoprazole  40 mg Oral Daily  . pregabalin  100 mg Oral BID  . rosuvastatin  20 mg Oral Daily  . temazepam  30 mg Oral QHS  . tiZANidine  4 mg Oral Daily   Continuous Infusions: . sodium chloride 100 mL/hr at 08/20/16 1249  . azithromycin Stopped (08/20/16 0041)     LOS: 0 days     Time spent: 32 min    [place], DO [facility] Pager [phone]  If 7PM-7AM, please contact night-coverage [email or web address] Password [email or web address] 08/20/2016, 12:54 PM

## 2016-08-20 NOTE — Progress Notes (Signed)
CRITICAL VALUE ALERT  Critical Value:  2.0 lactic acid  Date & Time Notied:  08/20/16  Provider Notified: MD. Jani Gravel  Orders Received/Actions taken: MD stated lab value is improving from 2.9 -2.0.

## 2016-08-20 NOTE — ED Notes (Signed)
Went in to check on pt, noticed pt's IV had come out; gauze and tape applied; PT told this tech "I want to go home".; RN notified

## 2016-08-20 NOTE — Progress Notes (Signed)
[name] is a 51 y.o. female patient admitted from ED awake, alert - oriented  X 4 - no acute distress noted.  VSS - Blood pressure 96/84, pulse 97, temperature 98.1 F (36.7 C), temperature source Oral, resp. rate (!) 24, last menstrual period 11/16/2010, SpO2 95 %.    IV in place, occlusive dsg intact without redness.  Orientation to room, and floor completed with information packet given to patient/family.  Patient declined safety video at this time.  Admission INP armband ID verified with patient/family, and in place.   SR up x 2, fall assessment complete, with patient and family able to verbalize understanding of risk associated with falls, and verbalized understanding to call nsg before up out of bed.  Call light within reach, patient able to voice, and demonstrate understanding.  Skin, clean-dry- intact with evidence of geralize bruising.   No evidence of skin break down noted on exam.  Will cont to eval and treat per MD orders.  Dorris Carnes, RN 08/20/2016 4:01 PM

## 2016-08-20 NOTE — ED Notes (Addendum)
Admitting MD - Eliseo Squires notified of patients BP and pain. States hold narcotic medications presently give tylenol and heating pack and continue to monitor BP- patient asymptomatic hypotension

## 2016-08-20 NOTE — Progress Notes (Signed)
Attempted to obtain arterial blood gas to left radial artery,was unsuccessful with attempt. Pressure was held over the site to five minutes with no hematoma.

## 2016-08-20 NOTE — ED Notes (Signed)
RN notified of low BP 

## 2016-08-20 NOTE — ED Notes (Signed)
CBG 313; RN notified

## 2016-08-20 NOTE — ED Notes (Signed)
Patient SpO2 decreased noted to be 85-88%/RA while resting. When patient awoke SpO2 increased to 95%. Patient informed will be placed on O2 while resting- patient placed on 2L [place]

## 2016-08-21 DIAGNOSIS — E118 Type 2 diabetes mellitus with unspecified complications: Secondary | ICD-10-CM | POA: Diagnosis not present

## 2016-08-21 DIAGNOSIS — S72002D Fracture of unspecified part of neck of left femur, subsequent encounter for closed fracture with routine healing: Secondary | ICD-10-CM | POA: Diagnosis not present

## 2016-08-21 DIAGNOSIS — J45909 Unspecified asthma, uncomplicated: Secondary | ICD-10-CM | POA: Diagnosis not present

## 2016-08-21 DIAGNOSIS — M6281 Muscle weakness (generalized): Secondary | ICD-10-CM | POA: Diagnosis not present

## 2016-08-21 DIAGNOSIS — J45901 Unspecified asthma with (acute) exacerbation: Secondary | ICD-10-CM | POA: Diagnosis not present

## 2016-08-21 DIAGNOSIS — E1165 Type 2 diabetes mellitus with hyperglycemia: Secondary | ICD-10-CM | POA: Diagnosis not present

## 2016-08-21 DIAGNOSIS — Z794 Long term (current) use of insulin: Secondary | ICD-10-CM | POA: Diagnosis not present

## 2016-08-21 DIAGNOSIS — R739 Hyperglycemia, unspecified: Secondary | ICD-10-CM | POA: Diagnosis not present

## 2016-08-21 DIAGNOSIS — M545 Low back pain: Secondary | ICD-10-CM | POA: Diagnosis not present

## 2016-08-21 DIAGNOSIS — J449 Chronic obstructive pulmonary disease, unspecified: Secondary | ICD-10-CM | POA: Diagnosis not present

## 2016-08-21 DIAGNOSIS — N184 Chronic kidney disease, stage 4 (severe): Secondary | ICD-10-CM | POA: Diagnosis not present

## 2016-08-21 DIAGNOSIS — J9612 Chronic respiratory failure with hypercapnia: Secondary | ICD-10-CM | POA: Diagnosis not present

## 2016-08-21 DIAGNOSIS — J4541 Moderate persistent asthma with (acute) exacerbation: Secondary | ICD-10-CM | POA: Diagnosis not present

## 2016-08-21 LAB — CBC
HCT: 38.3 % (ref 36.0–46.0)
Hemoglobin: 12 g/dL (ref 12.0–15.0)
MCH: 26.4 pg (ref 26.0–34.0)
MCHC: 31.3 g/dL (ref 30.0–36.0)
MCV: 84.4 fL (ref 78.0–100.0)
PLATELETS: 299 10*3/uL (ref 150–400)
RBC: 4.54 MIL/uL (ref 3.87–5.11)
RDW: 21.5 % — AB (ref 11.5–15.5)
WBC: 12.9 10*3/uL — AB (ref 4.0–10.5)

## 2016-08-21 LAB — URINE CULTURE: SPECIAL REQUESTS: NORMAL

## 2016-08-21 LAB — BASIC METABOLIC PANEL
ANION GAP: 11 (ref 5–15)
BUN: 21 mg/dL — ABNORMAL HIGH (ref 6–20)
CALCIUM: 8.9 mg/dL (ref 8.9–10.3)
CO2: 25 mmol/L (ref 22–32)
CREATININE: 1.2 mg/dL — AB (ref 0.44–1.00)
Chloride: 99 mmol/L — ABNORMAL LOW (ref 101–111)
GFR, EST AFRICAN AMERICAN: 59 mL/min — AB (ref >60)
GFR, EST NON AFRICAN AMERICAN: 51 mL/min — AB (ref >60)
Glucose, Bld: 357 mg/dL — ABNORMAL HIGH (ref 65–99)
Potassium: 3.9 mmol/L (ref 3.5–5.1)
SODIUM: 135 mmol/L (ref 135–145)

## 2016-08-21 LAB — GLUCOSE, CAPILLARY
GLUCOSE-CAPILLARY: 485 mg/dL — AB (ref 65–99)
GLUCOSE-CAPILLARY: 463 mg/dL — AB (ref 65–99)
Glucose-Capillary: 342 mg/dL — ABNORMAL HIGH (ref 65–99)
Glucose-Capillary: 467 mg/dL — ABNORMAL HIGH (ref 65–99)
Glucose-Capillary: 323 mg/dL — ABNORMAL HIGH (ref 65–99)

## 2016-08-21 MED ORDER — INSULIN GLARGINE 100 UNIT/ML ~~LOC~~ SOLN
75.0000 [IU] | Freq: Two times a day (BID) | SUBCUTANEOUS | Status: DC
  Filled 2016-08-21: qty 0.75

## 2016-08-21 MED ORDER — PREDNISONE 50 MG PO TABS
60.0000 mg | ORAL_TABLET | Freq: Every day | ORAL | Status: DC
Start: 2016-08-21 — End: 2016-08-21
  Administered 2016-08-21: 60 mg via ORAL
  Filled 2016-08-21: qty 1

## 2016-08-21 MED ORDER — METHYLPREDNISOLONE SODIUM SUCC 40 MG IJ SOLR: 40.0000 mg | Freq: Two times a day (BID) | INTRAMUSCULAR | Status: DC

## 2016-08-21 MED ORDER — PREDNISONE 10 MG PO TABS: ORAL_TABLET | ORAL | 0 refills | Status: DC

## 2016-08-21 MED ORDER — INSULIN ASPART 100 UNIT/ML ~~LOC~~ SOLN
25.0000 [IU] | Freq: Once | SUBCUTANEOUS | Status: AC
  Administered 2016-08-21: 25 [IU] via SUBCUTANEOUS

## 2016-08-21 MED ORDER — INSULIN GLARGINE 300 UNIT/ML ~~LOC~~ SOPN: 70.0000 [IU] | PEN_INJECTOR | Freq: Two times a day (BID) | SUBCUTANEOUS | 1 refills | Status: DC

## 2016-08-21 MED ORDER — INSULIN ASPART 100 UNIT/ML ~~LOC~~ SOLN
15.0000 [IU] | Freq: Three times a day (TID) | SUBCUTANEOUS | Status: DC
  Administered 2016-08-21: 15 [IU] via SUBCUTANEOUS

## 2016-08-21 MED ORDER — INSULIN ASPART 100 UNIT/ML ~~LOC~~ SOLN: 0.0000 [IU] | Freq: Every day | SUBCUTANEOUS | Status: DC

## 2016-08-21 MED ORDER — INSULIN ASPART 100 UNIT/ML ~~LOC~~ SOLN
0.0000 [IU] | Freq: Three times a day (TID) | SUBCUTANEOUS | Status: DC
  Administered 2016-08-21: 20 [IU] via SUBCUTANEOUS

## 2016-08-21 NOTE — Care Management Note (Addendum)
Case Management Note  Patient Details  Name: [name] MRN: [id number] Date of Birth: 1965-11-24  Subjective/Objective:     Asthma exacerbation, DM, CKD, AMS               Action/Plan: Discharge Planning: Spoke to pt at bedside. States she lives at home with her mother. Pt requesting a light manual wheelchair for home. Her wheelchair is in poor shape. Contacted [facility] rep for DME to be delivered to room prior to dc. Offered choice for [facility]. Pt agreeable to [facility] for [facility] RN. Pt has oxygen, neb machine, RW, RW with seat, shower chair and bedside commode at home.   PCP Michel Harrow MD  Expected Discharge Date:  08/21/2016               Expected Discharge Plan:  [place]  In-House Referral:  NA  Discharge planning Services  CM Consult  Post Acute Care Choice:  Home Health Choice offered to:  Patient  DME Arranged:  Lightweight manual wheelchair with seat cushion DME Agency:  [place]:  RN [facility] Agency:  [place]  Status of Service:  Completed, signed off  If discussed at [place] of Stay Meetings, dates discussed:    Additional Comments:  Erenest Rasher, RN 08/21/2016, 3:36 PM

## 2016-08-21 NOTE — Progress Notes (Signed)
[name] to be D/C'd Home per MD order.  Discussed with the patient and all questions fully answered.  VSS, Skin clean, dry and intact without evidence of skin break down, no evidence of skin tears noted. IV catheter discontinued intact. Site without signs and symptoms of complications. Dressing and pressure applied.  An After Visit Summary was printed and given to the patient. Patient received prescription.  D/c education completed with patient/family including follow up instructions, medication list, d/c activities limitations if indicated, with other d/c instructions as indicated by MD - patient able to verbalize understanding, all questions fully answered.   Patient instructed to return to ED, call 911, or call MD for any changes in condition.   Patient escorted via [place], and D/C home via private auto.  [name] 08/21/2016 5:20 PM

## 2016-08-21 NOTE — Discharge Summary (Signed)
Physician Discharge Summary  [name] JAS:[id number] DOB: [birth year] DOA: 08/19/2016  PCP: Michel Harrow, PA-C  Admit date: 08/19/2016 Discharge date: 08/21/2016       Recommendations for Outpatient Follow-Up:   1. Needs close pulm follow up 2. Needs close endocrine follow up- not sure what meds the patentt is actually taking 3. Home health RN as does not appear patient taking medications as prescribed at home 4. BMP 1 week 5. May  Be helpful for patient to bring all meds/insulin/nebs to PCP to see what patient is taking 6. TSH 6 weeks   Discharge Diagnosis:   Principal Problem:   Asthma exacerbation Active Problems:   Diabetes mellitus type 2 with complications, uncontrolled ([name])   Hyponatremia   Chronic kidney disease (CKD), stage IV (severe) ([name])   Altered mental status   Hyperglycemia   Discharge disposition:  Home.  Discharge Condition: Improved.  Diet recommendation: Low sodium, heart healthy.  Carbohydrate-modified.  Wound care: None.   History of Present Illness:  [name]  is a 51 y.o. female, w  HIV, HTN, DM type II, obesity, s/p Lt BKA,asthma,  oxygen dependent of2 L, tobacco abuse, probable OSA  who apparently earlier left AMA earlier today after having been admitted for Asthma exacerbation and went home.  She apparently called EMS cause her nebulizer was not working.  Pt felt " fuzzy " was having worsening of sob. Slight yellow nasal drainage, slight cough, mostly dry  She was taken by EMS back to ED for evaluation.   In ED, CXR => no acute abnormality.  CT brain negative.  Etoh <5,  Urine drug screen pending, Wbc 10.7, Hgb 12.2, Na 134, Glucose 504, Bun/creat 20/1.59 per ED physician there was some question of confusion.  pt will be admitted for w/up of asthma exacerbation and back pain   Hospital Course by Problem:  Asthma exacerbation, Hx of Co2 retention- keep O2 sats between 90-92% Solumedrol 31m iv q12- wean to PO and  quick taper Breo 1puff qday Bipap QHS- needs outpatient sleep study Resume home meds-- patient not doing nebulizers at home  Tachycardiac -suspect related to breathing issues -recent echo has been done in May -resolved  Leukocytosis wbc=10.7 Likely due to recent steroids trend  Lactic acidosis STOP METFORMIN due to renal insufficiency   ARF on CRF (renal insufficiency) likely due to diabetic/hypertensive nephropathy Improved with hydration  Hyperglycemia (bs 504), Dm2 -worsened by steroids Not exactly sure what patinet is doing at home-- unable to reach endo but did discuss with diabetic coordinator, plan to leave as is for now and have close follow up with Dr. KDwyane Dee Hyperlipidemia Cont crestor Tricor=>fenofibrate, Decrease dose to 541mpo qday due to renal insufficiency NOTE increase risk of rhabo w statin, fibrate combination Consider VASCEPA on discharge in place of fibrate as an outpatient  Chronic back pain No MRI found in EPIC Review of NCCSRS shows  Norco 10/32534m90, on 07/27/2016, By LukDene GentryP Lyrica 225m84m0, rf1 on 07/10/2016 by ThomLavada Mesirease Lyrica dose to 100mg37mbid due to renal insufficiency  Anxiety Quick [place] of [place] Clonazepam 90.5mg #65mon 08/16/2016 by KennetBernita Raisinmnia Quick Review of [place] Temazepam 30mg #8m8/16/2018 ,Temazepam 15mg 7/55m018, by Kenneth Shanon Brow 48m/05/2016 , 07/09/2016, by Jeffrey HBobby Rumpfical Consultants:    None.   Discharge Exam:   Vitals:   08/21/16 0905 08/21/16 0911  BP:  Pulse:    Resp:    Temp:    SpO2: (!) 87% 93%   Vitals:   08/21/16 0100 08/21/16 0518 08/21/16 0905 08/21/16 0911  BP:  120/84    Pulse: 77 79    Resp: 18 16    Temp:  98.1 F (36.7 C)    TempSrc:      SpO2: 93% 95% (!) 87% 93%    Gen:  NAD- very anxious to go home as her mother is there.  Patient left AMA a few days ago so will facilitate a safe d/c  with prescriptios   The results of significant diagnostics from this hospitalization (including imaging, microbiology, ancillary and laboratory) are listed below for reference.     Procedures and Diagnostic Studies:   Dg Chest 2 View  Result Date: 08/19/2016 CLINICAL DATA:  Shortness of breath for several months EXAM: CHEST  2 VIEW COMPARISON:  08/19/2016 FINDINGS: Cardiac shadow is mildly enlarged but stable. The lungs are well aerated bilaterally. Old rib fractures are noted on the right with healing. No infiltrate or sizable effusion is noted. No acute bony abnormality is noted. IMPRESSION: No acute abnormality noted. Electronically Signed   By: Inez Catalina M.D.   On: 08/19/2016 18:48   Ct Head Wo Contrast  Result Date: 08/19/2016 CLINICAL DATA:  Altered mental status with disorientation EXAM: CT HEAD WITHOUT CONTRAST TECHNIQUE: Contiguous axial images were obtained from the base of the skull through the vertex without intravenous contrast. COMPARISON:  May 20, 2016 FINDINGS: Brain: There is mild atrophy for age. There is no intracranial mass, hemorrhage, extra-axial fluid collection, or midline shift. There is slight small vessel disease in the centra semiovale bilaterally, stable. Elsewhere gray-white compartments are normal. No evident acute infarct. Vascular: There is no appreciable hyperdense vessel. There are foci of calcification in each carotid siphon region. Skull: The bony calvarium appears intact. There is a tiny metallic foreign body in thescalp at the vertex level anteriorly. Sinuses/Orbits: There is mild mucosal thickening in the inferior right maxillary antrum. There is mild mucosal thickening in several ethmoid air cells bilaterally. Other paranasal sinuses are clear. Orbits appear symmetric bilaterally. Other: Mastoid air cells are clear. IMPRESSION: Mild atrophy for age. Slight periventricular small vessel disease. No intracranial mass, hemorrhage, or extra-axial fluid collection.  No evident acute infarct. Foci of arterial vascular calcification noted. Mild paranasal sinus disease. Tiny metallic foreign body in the scalp at the anterior vertex. Electronically Signed   By: Lowella Grip III M.D.   On: 08/19/2016 20:50   US Renal  Result Date: 08/20/2016 CLINICAL DATA:  Renal insufficiency and altered mental status. EXAM: RENAL / URINARY TRACT ULTRASOUND COMPLETE COMPARISON:  05/03/2014 CT FINDINGS: Right Kidney: Length: 11.1 cm. Echogenicity within normal limits. No mass or hydronephrosis visualized. Left Kidney: Length: 10.9 cm. Echogenicity within normal limits. No mass or hydronephrosis visualized. Bladder: Appears normal for degree of bladder distention. IMPRESSION: Maintained cortical-medullary distinction involving both kidneys without obstructive uropathy. No sonographic findings to explain the patient's renal insufficiency. Electronically Signed   By: Ashley Royalty M.D.   On: 08/20/2016 01:33   Dg Chest Port 1 View  Result Date: 08/19/2016 CLINICAL DATA:  Shortness of breath EXAM: PORTABLE CHEST 1 VIEW COMPARISON:  06/18/2016 FINDINGS: Low lung volumes. No focal consolidation or effusion. Borderline cardiomegaly. No pneumothorax. IMPRESSION: Low lung volumes with borderline cardiomegaly. No edema or infiltrate. Electronically Signed   By: Donavan Foil M.D.   On: 08/19/2016 01:20  Labs:   Basic Metabolic Panel:  Recent Labs Lab 08/19/16 0102 08/19/16 1744 08/20/16 0437 08/21/16 0537  NA 141 134* 138 135  K 4.4 4.6 4.2 3.9  CL 99* 95* 100* 99*  CO2 32 _0 GLUCOSE 263* 504* 438* 357*  BUN 15 20 22* 21*  CREATININE 1.37* 1.59* 1.43* 1.20*  CALCIUM 9.3 9.6 9.1 8.9   GFR Estimated Creatinine Clearance: 69.3 mL/min (A) (by C-G formula based on SCr of 1.2 mg/dL (H)). Liver Function Tests:  Recent Labs Lab 08/19/16 1744 08/20/16 0437  AST 18 13*  ALT 14 14  ALKPHOS 62 56  BILITOT 0.9 0.8  PROT 7.3 6.6  ALBUMIN 3.1* 3.0*   No results for  input(s): LIPASE, AMYLASE in the last 168 hours.  Recent Labs Lab 08/19/16 1809  AMMONIA 30   Coagulation profile No results for input(s): INR, PROTIME in the last 168 hours.  CBC:  Recent Labs Lab 08/19/16 0102 08/19/16 1744 08/20/16 0437 08/21/16 0537  WBC 13.8* 10.7* 15.3* 12.9*  NEUTROABS 10.5* 9.8*  --   --   HGB 13.3 12.2 11.7* 12.0  HCT 43.9 39.0 37.9 38.3  MCV 89.0 85.9 85.6 84.4  PLT 231 273 276 299   Cardiac Enzymes: No results for input(s): CKTOTAL, CKMB, CKMBINDEX, TROPONINI in the last 168 hours. BNP: Invalid input(s): POCBNP CBG:  Recent Labs Lab 08/20/16 2102 08/21/16 0750 08/21/16 1158 08/21/16 1316 08/21/16 1401  GLUCAP 364* 342* 485* 463* 467*   D-Dimer No results for input(s): DDIMER in the last 72 hours. Hgb A1c No results for input(s): HGBA1C in the last 72 hours. Lipid Profile No results for input(s): CHOL, HDL, LDLCALC, TRIG, CHOLHDL, LDLDIRECT in the last 72 hours. Thyroid function studies  Recent Labs  08/20/16 0437  TSH 0.259*   Anemia work up No results for input(s): VITAMINB12, FOLATE, FERRITIN, TIBC, IRON, RETICCTPCT in the last 72 hours. Microbiology Recent Results (from the past 240 hour(s))  MRSA PCR Screening     Status: None   Collection Time: 08/19/16 10:58 AM  Result Value Ref Range Status   MRSA by PCR NEGATIVE NEGATIVE Final    Comment:        The GeneXpert MRSA Assay (FDA approved for NASAL specimens only), is one component of a comprehensive MRSA colonization surveillance program. It is not intended to diagnose MRSA infection nor to guide or monitor treatment for MRSA infections.   Culture, blood (routine x 2)     Status: None (Preliminary result)   Collection Time: 08/19/16  5:44 PM  Result Value Ref Range Status   Specimen Description BLOOD LEFT ANTECUBITAL  Final   Special Requests   Final    BOTTLES DRAWN AEROBIC AND ANAEROBIC Blood Culture adequate volume   Culture NO GROWTH 2 DAYS  Final    Report Status PENDING  Incomplete  Urine culture     Status: Abnormal   Collection Time: 08/19/16  5:55 PM  Result Value Ref Range Status   Specimen Description URINE, CLEAN CATCH  Final   Special Requests Normal  Final   Culture MULTIPLE SPECIES PRESENT, SUGGEST RECOLLECTION (A)  Final   Report Status 08/21/2016 FINAL  Final  Culture, blood (routine x 2)     Status: None (Preliminary result)   Collection Time: 08/19/16  6:09 PM  Result Value Ref Range Status   Specimen Description BLOOD LEFT WRIST  Final   Special Requests   Final    BOTTLES DRAWN AEROBIC AND ANAEROBIC  Blood Culture adequate volume   Culture NO GROWTH 2 DAYS  Final   Report Status PENDING  Incomplete     Discharge Instructions:   Discharge Instructions    Diet - low sodium heart healthy    Complete by:  As directed    Diet Carb Modified    Complete by:  As directed    Discharge instructions    Complete by:  As directed    Home health RN- patient not taking medication consistently (DM meds/ lasix/nebs) Continue home O2   Increase activity slowly    Complete by:  As directed      Allergies as of 08/21/2016      Reactions   Cortizone-10 [hydrocortisone] Rash   Per patient, given local injection at knee and developed rash at local site.       Medication List    STOP taking these medications   atenolol 25 MG tablet Commonly known as:  TENORMIN   budesonide 0.5 MG/2ML nebulizer solution Commonly known as:  PULMICORT   metFORMIN 750 MG 24 hr tablet Commonly known as:  GLUCOPHAGE-XR   nystatin 100000 UNIT/ML suspension Commonly known as:  MYCOSTATIN     TAKE these medications   ACCU-CHEK AVIVA PLUS w/Device Kit USE TO TEST BLOOD SUGAR DAILY   acetaminophen 500 MG tablet Commonly known as:  TYLENOL Take 1,000 mg by mouth every 6 (six) hours as needed for mild pain or moderate pain.   ARIPiprazole 10 MG tablet Commonly known as:  ABILIFY Take 10 mg by mouth daily.   aspirin 81 MG EC  tablet Take 1 tablet (81 mg total) by mouth daily.   budesonide-formoterol 80-4.5 MCG/ACT inhaler Commonly known as:  SYMBICORT Inhale 2 puffs into the lungs 2 (two) times daily.   clonazePAM 0.5 MG tablet Commonly known as:  KLONOPIN Take 0.5 mg by mouth 3 (three) times daily as needed for anxiety.   diphenoxylate-atropine 2.5-0.025 MG tablet Commonly known as:  LOMOTIL Take 1 tablet by mouth 4 (four) times daily as needed for diarrhea or loose stools.   DULoxetine 60 MG capsule Commonly known as:  CYMBALTA Take 60 mg by mouth daily.   Eszopiclone 3 MG Tabs Take 1 tablet (3 mg total) by mouth at bedtime as needed. Take immediately before bedtime What changed:  reasons to take this  additional instructions   fenofibrate 145 MG tablet Commonly known as:  TRICOR Take 145 mg by mouth daily.   furosemide 80 MG tablet Commonly known as:  LASIX Take 80 mg by mouth daily.   GENVOYA 150-150-200-10 MG Tabs tablet Generic drug:  elvitegravir-cobicistat-emtricitabine-tenofovir TAKE 1 TABLET BY MOUTH EVERY DAY   glucose blood test strip Commonly known as:  ACCU-CHEK AVIVA Use to test blood sugar 3 times daily   HUMULIN R U-500 KWIKPEN 500 UNIT/ML kwikpen Generic drug:  insulin regular human CONCENTRATED Inject 35-60 Units into the skin See admin instructions. Inject 60 units subcutaneously with breakfast, 50 units with lunch and 35 units with supper   HYDROcodone-acetaminophen 10-325 MG tablet Commonly known as:  NORCO Take 1 tablet by mouth every 8 (eight) hours as needed (pain).   Insulin Glargine 300 UNIT/ML Sopn Commonly known as:  TOUJEO SOLOSTAR Inject 70 Units into the skin 2 (two) times daily. What changed:  how much to take  Another medication with the same name was removed. Continue taking this medication, and follow the directions you see here.   ipratropium-albuterol 0.5-2.5 (3) MG/3ML Soln Commonly known as:  DUONEB Take 3 mLs by nebulization 3 (three)  times daily. What changed:  when to take this  reasons to take this   magnesium oxide 400 MG tablet Commonly known as:  MAG-OX Take 400 mg by mouth daily.   pantoprazole 40 MG tablet Commonly known as:  PROTONIX Take 40 mg by mouth daily.   potassium chloride SA 20 MEQ tablet Commonly known as:  K-DUR,KLOR-CON Take 1 tablet (20 mEq total) by mouth daily. X 3 days, then check potassium levels if needed to continue What changed:  additional instructions   predniSONE 10 MG tablet Commonly known as:  DELTASONE 50 mg PO x 1 day, then 74m x1 then 30 mg x 1 day, then 20 mg x1 then 170mx 1 day and stop   pregabalin 225 MG capsule Commonly known as:  LYRICA Take 225 mg by mouth 2 (two) times daily.   rosuvastatin 20 MG tablet Commonly known as:  CRESTOR Take 20 mg by mouth daily. Reported on 05/02/2015   temazepam 30 MG capsule Commonly known as:  RESTORIL Take 30 mg by mouth at bedtime. What changed:  Another medication with the same name was removed. Continue taking this medication, and follow the directions you see here.   tiZANidine 4 MG tablet Commonly known as:  ZANAFLEX Take 4 mg by mouth daily.   VICTOZA 18 MG/3ML Sopn Generic drug:  liraglutide Inject 1.8 mg into the skin daily.   Vitamin D3 5000 units Tabs Take 5,000 Units by mouth daily.            Durable Medical Equipment        Start     Ordered   08/21/16 1527  For home use only DME lightweight manual wheelchair with seat cushion  Once    Comments:  Patient suffers from left BKA which impairs their ability to perform daily activities like standing, walking in the home.  A rolling walker will not resolve  issue with performing activities of daily living. A wheelchair will allow patient to safely perform daily activities. Patient is not able to propel themselves in the home using a standard weight wheelchair due to weakness. Patient can self propel in the lightweight wheelchair.  Accessories: elevating  leg rests (ELRs), wheel locks, extensions and anti-tippers.   08/21/16 1527     Follow-up Information    [facility]   Specialty:  [place]:  Home Health RN Contact information: [phone]        MaMichel HarrowPA-C Follow up in 1 week(s).   Specialty:  Physician Assistant Why:  needs close blood sugar follow up Contact information: [phone]      HaCampbell RichesMD Follow up.   Specialty:  Infectious Diseases Contact information: [phone]        KuElayne SnareMD Follow up in 1 week(s).   Specialty:  Endocrinology Why:  bring log of blood sugars and how much medications you have been taking Contact information: [address] [phone]          Time coordinating discharge: 35 min  Signed:  [facility] 08/21/2016, 3:52 PM

## 2016-08-21 NOTE — Progress Notes (Signed)
Pt blood glucose level was 485 at 1200. MD Lucianne Lei notified.

## 2016-08-21 NOTE — H&P (Addendum)
Rt placed pt on 2 LPM [place] due to low sats. No distress noted at this time.

## 2016-08-21 NOTE — Progress Notes (Signed)
Rt went to give pt her neb tx. Pt refused to placed 02 back on after neb tx. No distress noted at this time.

## 2016-08-21 NOTE — Progress Notes (Signed)
Inpatient Diabetes Program Recommendations  AACE/ADA: New Consensus Statement on Inpatient Glycemic Control (2015)  Target Ranges:  Prepandial:   less than 140 mg/dL      Peak postprandial:   less than 180 mg/dL (1-2 hours)      Critically ill patients:  140 - 180 mg/dL   Lab Results  Component Value Date   GLUCAP 467 (H) 08/21/2016   HGBA1C 9.8 (H) 05/20/2016    Review of Glycemic Control  Spoke with pt on 2 different visits today. Pt appeared somewhat confused as to how much insulin she takes at home. States she does have hypoglycemia and does not eat regularly at times. Stated she was on Toujeo 65 units bid, then 70 units bid, and then stated that her blood sugars ran high on both. U-500 - 60-50-35 units tidwc, then 1 hour later, stated 10-31-58 tidwc. States she checks her blood sugars several times/day. Has appt to see Dr. Dwyane Dee this week. Will have [place] RN come to her house. Will continue Victoza 1.8 mg QD.  Inpatient Diabetes Program Recommendations:    Would send home on PTA meds and have pt see endo as soon as possible. Also discussed hypoglycemia s/s and treatment.  Discussed with RN and MD.  Thank you. Lorenda Peck, RD, LDN, CDE Inpatient Diabetes Coordinator [phone]

## 2016-08-21 NOTE — Care Management Obs Status (Signed)
[place] NOTIFICATION   Patient Details  Name: [name] MRN: [id number] Date of Birth: 13-Jan-1965   Medicare Observation Status Notification Given:  Yes    Erenest Rasher, RN 08/21/2016, 9:11 AM

## 2016-08-23 DIAGNOSIS — J9612 Chronic respiratory failure with hypercapnia: Secondary | ICD-10-CM | POA: Diagnosis not present

## 2016-08-23 DIAGNOSIS — E1165 Type 2 diabetes mellitus with hyperglycemia: Secondary | ICD-10-CM | POA: Diagnosis not present

## 2016-08-23 DIAGNOSIS — M1612 Unilateral primary osteoarthritis, left hip: Secondary | ICD-10-CM | POA: Diagnosis not present

## 2016-08-23 DIAGNOSIS — I129 Hypertensive chronic kidney disease with stage 1 through stage 4 chronic kidney disease, or unspecified chronic kidney disease: Secondary | ICD-10-CM | POA: Diagnosis not present

## 2016-08-23 DIAGNOSIS — E1122 Type 2 diabetes mellitus with diabetic chronic kidney disease: Secondary | ICD-10-CM | POA: Diagnosis not present

## 2016-08-23 DIAGNOSIS — J45901 Unspecified asthma with (acute) exacerbation: Secondary | ICD-10-CM | POA: Diagnosis not present

## 2016-08-24 ENCOUNTER — Ambulatory Visit: Payer: Medicare HMO | Admitting: Internal Medicine

## 2016-08-24 ENCOUNTER — Encounter: Payer: Self-pay | Admitting: Endocrinology

## 2016-08-24 ENCOUNTER — Ambulatory Visit (INDEPENDENT_AMBULATORY_CARE_PROVIDER_SITE_OTHER): Payer: Medicare HMO | Admitting: Endocrinology

## 2016-08-24 VITALS — BP 140/82 | HR 103

## 2016-08-24 DIAGNOSIS — E1165 Type 2 diabetes mellitus with hyperglycemia: Secondary | ICD-10-CM

## 2016-08-24 DIAGNOSIS — Z794 Long term (current) use of insulin: Secondary | ICD-10-CM

## 2016-08-24 LAB — CULTURE, BLOOD (ROUTINE X 2)
Culture: NO GROWTH
Culture: NO GROWTH
SPECIAL REQUESTS: ADEQUATE
SPECIAL REQUESTS: ADEQUATE

## 2016-08-24 LAB — POCT GLYCOSYLATED HEMOGLOBIN (HGB A1C): Hemoglobin A1C: 8.5

## 2016-08-24 NOTE — Progress Notes (Signed)
Patient ID: [name], female   DOB: 1965-02-10, 51 y.o.   MRN: [id number]           Reason for Appointment: Follow-up  for Type 2 Diabetes   History of Present Illness:          Date of diagnosis of type 2 diabetes mellitus: 2000       Background history:     The patient was apparently diagnosed incidentally with her diabetes 16 years ago 16 years ago, was asymptomatic. She took metformin and possibly other oral hypoglycemic drugs but subsequently went on insulin in 2005 She was taking various insulin regimens in the past  She was being treated by an endocrinologist some time ago and he switched her To Toujeo and also tried her on Byetta.  She thinks that she had better blood sugars with Byetta but she did not follow-up  Novolog was switched to U-500 insulin in 3/17   Recent history:   INSULIN regimen is:  Toujeo 65 units bid  Humulin R U-500: 50 units am, 40 units before lunch 30 before supper  Non-insulin hypoglycemic drugs the patient is taking are: Victoza 1.48m    Her A1c is relatively better at 8.5, previously 9.8  Current management, blood sugar patterns and problems identified:  She has had another hospitalization for COPD again and was discharged on prednisone, currently on a tapering dose with 40 mg today  However she has checked her blood sugar very sporadically and generally mostly in the first part of the day  She also said that prior to her hospitalization she was taking her smallest dose of Humulin R in the morning and largest dose late in the evening with occasional episodes of low blood sugars later at night and higher readings in the afternoon  Although she is generally eating 2 meals a day she is drinking a Slim fast type of drink in the morning with 20 g of sugar  Unable to check her weight but it appears to be fluctuating  TOUJEO dose was reduced by 5 units on the last visit  She thinks she is taking her Victoza daily     Side effects from  medications have been: None  Compliance with the medical regimen:  fair Hypoglycemia:  rarely low at 4-6pm Glucose monitoring:  done about 1 times a day         Glucometer: One Touch Verio.       Blood Glucose readings as follows  Blood sugars from July showed median of 136 and blood sugar range 47-334 Blood sugars in the last 2 days: 121 fasting today, yesterday 86 and 99 and previously on 8/22 594 fasting    Self-care:   Typical meal intake: Breakfast is mostly a yogurt or Slim fast   Lunch is variable at 3 pm, has mixed meal at dinnertime, sometimes skipped Has variable amounts of snacks.  She is avoiding drinks with sugar                [facility]              Exercise:  none  Weight history:  Wt Readings from Last 3 Encounters:  08/19/16 230 lb (104.3 kg)  06/29/16 236 lb (107 kg)  06/25/16 203 lb 6.4 oz (92.3 kg)    Glycemic control: 11 in 8/16   Lab Results  Component Value Date   HGBA1C 8.5 08/24/2016   HGBA1C 9.8 (H) 05/20/2016  HGBA1C 10.3 03/05/2016   Lab Results  Component Value Date   MICROALBUR 1.2 03/05/2016   LDLCALC 59 06/12/2016   CREATININE 1.20 (H) 08/21/2016    Other active problems: See review of systems    Allergies as of 08/24/2016      Reactions   Cortizone-10 [hydrocortisone] Rash   Per patient, given local injection at knee and developed rash at local site.       Medication List       Accurate as of 08/24/16 11:59 PM. Always use your most recent med list.          ACCU-CHEK AVIVA PLUS w/Device Kit USE TO TEST BLOOD SUGAR DAILY   ARIPiprazole 10 MG tablet Commonly known as:  ABILIFY Take 10 mg by mouth daily.   aspirin 81 MG EC tablet Take 1 tablet (81 mg total) by mouth daily.   budesonide-formoterol 80-4.5 MCG/ACT inhaler Commonly known as:  SYMBICORT Inhale 2 puffs into the lungs 2 (two) times daily.   clonazePAM 0.5 MG tablet Commonly known as:  KLONOPIN Take 0.5 mg by  mouth 3 (three) times daily as needed for anxiety.   diphenoxylate-atropine 2.5-0.025 MG tablet Commonly known as:  LOMOTIL Take 1 tablet by mouth 4 (four) times daily as needed for diarrhea or loose stools.   DULoxetine 60 MG capsule Commonly known as:  CYMBALTA Take 60 mg by mouth daily.   Eszopiclone 3 MG Tabs Take 1 tablet (3 mg total) by mouth at bedtime as needed. Take immediately before bedtime   fenofibrate 145 MG tablet Commonly known as:  TRICOR Take 145 mg by mouth daily.   furosemide 80 MG tablet Commonly known as:  LASIX Take 80 mg by mouth daily.   GENVOYA 150-150-200-10 MG Tabs tablet Generic drug:  elvitegravir-cobicistat-emtricitabine-tenofovir TAKE 1 TABLET BY MOUTH EVERY DAY   glucose blood test strip Commonly known as:  ACCU-CHEK AVIVA Use to test blood sugar 3 times daily   HUMULIN R U-500 KWIKPEN 500 UNIT/ML kwikpen Generic drug:  insulin regular human CONCENTRATED Inject 35-60 Units into the skin See admin instructions. Inject 60 units subcutaneously with breakfast, 50 units with lunch and 35 units with supper   HYDROcodone-acetaminophen 10-325 MG tablet Commonly known as:  NORCO Take 1 tablet by mouth every 8 (eight) hours as needed (pain).   Insulin Glargine 300 UNIT/ML Sopn Commonly known as:  TOUJEO SOLOSTAR Inject 70 Units into the skin 2 (two) times daily.   ipratropium-albuterol 0.5-2.5 (3) MG/3ML Soln Commonly known as:  DUONEB Take 3 mLs by nebulization 3 (three) times daily.   magnesium oxide 400 MG tablet Commonly known as:  MAG-OX Take 400 mg by mouth daily.   pantoprazole 40 MG tablet Commonly known as:  PROTONIX Take 40 mg by mouth daily.   potassium chloride SA 20 MEQ tablet Commonly known as:  K-DUR,KLOR-CON Take 1 tablet (20 mEq total) by mouth daily. X 3 days, then check potassium levels if needed to continue   predniSONE 10 MG tablet Commonly known as:  DELTASONE 50 mg PO x 1 day, then 30m x1 then 30 mg x 1 day,  then 20 mg x1 then 147mx 1 day and stop   pregabalin 225 MG capsule Commonly known as:  LYRICA Take 225 mg by mouth 2 (two) times daily.   rosuvastatin 20 MG tablet Commonly known as:  CRESTOR Take 20 mg by mouth daily. Reported on 05/02/2015   temazepam 30 MG capsule Commonly known as:  RESTORIL Take 30  mg by mouth at bedtime.   tiZANidine 4 MG tablet Commonly known as:  ZANAFLEX Take 4 mg by mouth daily.   VICTOZA 18 MG/3ML Sopn Generic drug:  liraglutide Inject 1.8 mg into the skin daily.   Vitamin D3 5000 units Tabs Take 5,000 Units by mouth daily.            Discharge Care Instructions        Start     Ordered   08/24/16 0000  POCT glycosylated hemoglobin (Hb A1C)     08/24/16 1614      Allergies:  Allergies  Allergen Reactions  . Cortizone-10 [Hydrocortisone] Rash    Per patient, given local injection at knee and developed rash at local site.     Past Medical History:  Diagnosis Date  . Acute metabolic encephalopathy 1/61/0960  . Acute on chronic respiratory failure with hypoxia ([place]) 05/19/2016  . Anxiety   . ARF (acute renal failure) ([place]) 05/03/2014  . Arthritis    LEFT HIP  . Asthma    HOSPITALIZED WITH EXCERBATION OF ASTHMA - AND BRONCHITIS AND THE FLU DEC 2013  . Asthma exacerbation attacks 12/28/2011  . Bronchitis 12/28/2011  . CAP (community acquired pneumonia) 07/25/2012  . Chronic kidney disease (CKD), stage IV (severe) ([place]) 11/10/2014  . Class 3 obesity due to excess calories with serious comorbidity and body mass index (BMI) of 40.0 to 44.9 in adult [facility])   . Colles' fracture of left radius   . Depression   . Diabetes mellitus    ON INSULIN AND ORAL MEDICATIONS  . Diabetes mellitus type 2 with complications, uncontrolled ([place]) 04/19/2008   Qualifier: Diagnosis of  By: Tomma Lightning MD, Claiborne Billings     . Diabetic foot infection ([place]) 11/09/2014  . Diabetic ketoacidosis without coma associated with type 2 diabetes mellitus ([place])   . Diabetic  neuropathy ([place]) 11/10/2014  . Diarrhea 08/30/2008   Qualifier: Diagnosis of  By: Tomma Lightning MD, Claiborne Billings    . Diastolic dysfunction 45/04/979  . Difficult intravenous access   . DKA, type 2 ([place]) 04/04/2016  . Dyslipidemia 12/23/2008   Qualifier: Diagnosis of  By: Tomma Lightning MD, Claiborne Billings    . Essential hypertension 04/19/2008   Qualifier: Diagnosis of  By: Tomma Lightning MD, Claiborne Billings    . FATIGUE 07/12/2008   Qualifier: Diagnosis of  By: Tomma Lightning MD, Claiborne Billings    . Femoral neck fracture ([place]) 07/16/2011  . Femur fracture, left ([place]) 07/08/2014  . Fever   . Fracture of distal femur ([place]) 07/08/2014  . GERD (gastroesophageal reflux disease)   . Hereditary and idiopathic peripheral neuropathy 04/19/2008   Qualifier: Diagnosis of  By: Tomma Lightning MD, Claiborne Billings    . Hip fracture, left ([place]) 07/16/2011  . Hip pain   . HIV infection ([place]) 2000  . Human immunodeficiency virus (HIV) disease ([place]) 04/19/2008   [id number] +   . Hyperlipidemia   . Hypertension   . Hyponatremia 12/29/2011  . Influenza A 12/29/2011  . Influenza B   . Insomnia 06/12/2016  . Left hip pain 05/03/2014  . Mood disorder ([place]) 04/19/2008   Qualifier: Diagnosis of  By: Tomma Lightning MD, Claiborne Billings    . Neuropathy    NEUROPATHY HANDS AND FEET  . NSTEMI (non-ST elevated myocardial infarction) ([place]) 05/19/2016  . Obesity hypoventilation syndrome ([place])   . Osteoporosis 07/08/2014  . Pain    SEVERE PAIN LEFT HIP - HX OF LEFT HIP PINNING JULY 2013  . Perimenopausal symptoms 02/13/2012   LMP around 2011. On estrace  and provera since around 2012 for hot flashes, mood swings. Estrace 2 mg daily, Provera 2.5 mg for 5 days each month.   . Repeated falls 07/30/2011  . Sepsis ([place]) 04/04/2016  . Shortness of breath    ALLERGIES ARE "ACTING UP"  . SOB (shortness of breath)   . THRUSH 05/20/2008   Qualifier: Diagnosis of  By: Tomma Lightning MD, Claiborne Billings      . Tobacco use disorder 04/14/2010    Past Surgical History:  Procedure Laterality Date  . AMPUTATION Left 11/18/2014   Procedure: LEFT BELOW  KNEE AMPUTATION;  Surgeon: Leandrew Koyanagi, MD;  Location: [place];  Service: Orthopedics;  Laterality: Left;  . CAST APPLICATION Left 03/05/1935   Procedure: CAST APPLICATION (FIBERGLASS);  Surgeon: Latanya Maudlin, MD;  Location: [facility];  Service: Orthopedics;  Laterality: Left;  CLOSED REDUCTION OF LEFT COLLES FRACTURE WITH SHORT ARM CAST  . HARDWARE REMOVAL Left 05/05/2014   Procedure: HARDWARE REMOVAL LEFT HIP;  Surgeon: Latanya Maudlin, MD;  Location: [facility];  Service: Orthopedics;  Laterality: Left;  REMOVAL BIOMET 6.5-8.0 CANNULATED SCREW  . HIP ARTHROPLASTY Left 05/05/2014   Procedure: ARTHROPLASTY OPEN REDUCTION INTERNAL FIXATION LEFT HIP AND REMOVAL OF TWO CANNULATED SCREW;  Surgeon: Latanya Maudlin, MD;  Location: [facility];  Service: Orthopedics;  Laterality: Left;  . HIP PINNING,CANNULATED  07/16/2011   Procedure: CANNULATED HIP PINNING;  Surgeon: Gearlean Alf, MD;  Location: [facility];  Service: Orthopedics;  Laterality: Left;  . HIP PINNING,CANNULATED Left 04/09/2012   Procedure: CANNULATED HIP PINNING AND HARDWARE REVISION;  Surgeon: Gearlean Alf, MD;  Location: [facility];  Service: Orthopedics;  Laterality: Left;  . I&D EXTREMITY Left 11/16/2014   Procedure:  DEBRIDEMENT OF LEFT FOOT POSSIBLE APPLICATION OF INTEGRIA AND VAC ;  Surgeon: Irene Limbo, MD;  Location: [place];  Service: Plastics;  Laterality: Left;  . PERIPHERAL VASCULAR CATHETERIZATION N/A 11/15/2014   Procedure: Abdominal Aortogram;  Surgeon: Conrad Vermilion, MD;  Location: [place] [facility];  Service: Cardiovascular;  Laterality: N/A;  . PERIPHERAL VASCULAR CATHETERIZATION  11/15/2014   Procedure: Lower Extremity Angiography;  Surgeon: Conrad Foreman, MD;  Location: [place] [facility];  Service: Cardiovascular;;  . PERIPHERAL VASCULAR CATHETERIZATION Left 11/15/2014   Procedure: Peripheral Vascular Intervention;  Surgeon: Conrad Guide Rock, MD;  Location: [place];  Service: Cardiovascular;  Laterality: Left;  popliteal artery  stenting    Family History  Problem Relation Age of Onset  . Diabetes Mother   . Hypertension Mother   . Vision loss Mother   . Heart disease Mother   . Hypertension Father   . Thyroid disease Neg Hx     Social History:  reports that she quit smoking about 3 weeks ago. Her smoking use included E-cigarettes and Cigarettes. She has a 17.50 pack-year smoking history. She has never used smokeless tobacco. She reports that she does not drink alcohol or use drugs.    Review of Systems   RENAL dysfunction: Appears better recently  Lab Results  Component Value Date   CREATININE 1.20 (H) 08/21/2016   BUN 21 (H) 08/21/2016   NA 135 08/21/2016   K 3.9 08/21/2016   CL 99 (L) 08/21/2016   CO2 25 08/21/2016     Lipid history: Has been treated with  Fenofibrate and Crestor by PCP     Lab Results  Component Value Date   CHOL 139 06/12/2016   HDL 49 (L) 06/12/2016   LDLCALC 59 06/12/2016  TRIG 154 (H) 06/12/2016   CHOLHDL 2.8 06/12/2016          She takes multiple psychotropic drugs   Physical Examination:  BP 140/82   Pulse (!) 103   LMP 11/16/2010   SpO2 90%       ASSESSMENT:  Diabetes type 2, uncontrolled    See history of present illness for detailed discussion of current diabetes management, blood sugar patterns and problems identified  A1c is 8.5 but this is better than usual  Her blood sugars have been Again quite variable and difficult to know what her patterns are since she is checking rarely irregularly Her mealtimes and carbohydrate intake is also quite variable from day to day Also now she is on a prednisone taper making it difficult to assess her insulin requirement Does appear to be needing relatively smaller amounts of basal insulin and more insulin to cover her midday and afternoon hyperglycemia with the U-500 insulin She has only started taking her insulin as prescribed the last couple of days and blood sugars appear to be better Also on Victoza     PLAN:   She will try to check blood sugars consistently 2-3 times a day either on waking up or after meals including at bedtime No change in insulin at the moment She will continue 65 units of Toujeo but consider reducing it further if she is waking up at low readings after stopping prednisone She'll continue the same regimen for the Humalog for now but we need to make adjustments based on her blood sugar patterns on the next visit    There are no Patient Instructions on file for this visit.   [name] 08/26/2016, 9:44 PM   Note: This office note was prepared with Dragon voice recognition system technology. Any transcriptional errors that result from this process are unintentional.

## 2016-08-27 ENCOUNTER — Ambulatory Visit: Payer: Medicare HMO | Admitting: Internal Medicine

## 2016-08-27 DIAGNOSIS — Z79899 Other long term (current) drug therapy: Secondary | ICD-10-CM | POA: Diagnosis not present

## 2016-08-27 DIAGNOSIS — M25552 Pain in left hip: Secondary | ICD-10-CM | POA: Diagnosis not present

## 2016-08-27 DIAGNOSIS — M545 Low back pain: Secondary | ICD-10-CM | POA: Diagnosis not present

## 2016-08-27 DIAGNOSIS — Z89612 Acquired absence of left leg above knee: Secondary | ICD-10-CM | POA: Diagnosis not present

## 2016-09-01 ENCOUNTER — Other Ambulatory Visit: Payer: Self-pay | Admitting: Infectious Diseases

## 2016-09-03 ENCOUNTER — Emergency Department (HOSPITAL_COMMUNITY): Payer: Medicare HMO

## 2016-09-03 ENCOUNTER — Encounter (HOSPITAL_COMMUNITY): Payer: Self-pay

## 2016-09-03 ENCOUNTER — Observation Stay (HOSPITAL_COMMUNITY)
Admission: EM | Admit: 2016-09-03 | Discharge: 2016-09-04 | Disposition: A | Discharge status: Home / Self Care | Payer: Medicare HMO | Attending: Internal Medicine | Admitting: Internal Medicine

## 2016-09-03 DIAGNOSIS — E11628 Type 2 diabetes mellitus with other skin complications: Secondary | ICD-10-CM | POA: Insufficient documentation

## 2016-09-03 DIAGNOSIS — F419 Anxiety disorder, unspecified: Secondary | ICD-10-CM | POA: Insufficient documentation

## 2016-09-03 DIAGNOSIS — G4700 Insomnia, unspecified: Secondary | ICD-10-CM | POA: Diagnosis not present

## 2016-09-03 DIAGNOSIS — E785 Hyperlipidemia, unspecified: Secondary | ICD-10-CM | POA: Diagnosis not present

## 2016-09-03 DIAGNOSIS — M6281 Muscle weakness (generalized): Secondary | ICD-10-CM | POA: Diagnosis not present

## 2016-09-03 DIAGNOSIS — J9622 Acute and chronic respiratory failure with hypercapnia: Secondary | ICD-10-CM | POA: Diagnosis not present

## 2016-09-03 DIAGNOSIS — I10 Essential (primary) hypertension: Secondary | ICD-10-CM

## 2016-09-03 DIAGNOSIS — G9340 Encephalopathy, unspecified: Secondary | ICD-10-CM | POA: Diagnosis not present

## 2016-09-03 DIAGNOSIS — F329 Major depressive disorder, single episode, unspecified: Secondary | ICD-10-CM | POA: Diagnosis not present

## 2016-09-03 DIAGNOSIS — E1122 Type 2 diabetes mellitus with diabetic chronic kidney disease: Secondary | ICD-10-CM | POA: Diagnosis not present

## 2016-09-03 DIAGNOSIS — K219 Gastro-esophageal reflux disease without esophagitis: Secondary | ICD-10-CM | POA: Diagnosis not present

## 2016-09-03 DIAGNOSIS — G8929 Other chronic pain: Secondary | ICD-10-CM | POA: Insufficient documentation

## 2016-09-03 DIAGNOSIS — Z833 Family history of diabetes mellitus: Secondary | ICD-10-CM | POA: Insufficient documentation

## 2016-09-03 DIAGNOSIS — R531 Weakness: Secondary | ICD-10-CM

## 2016-09-03 DIAGNOSIS — Z6830 Body mass index (BMI) 30.0-30.9, adult: Secondary | ICD-10-CM | POA: Diagnosis not present

## 2016-09-03 DIAGNOSIS — D72829 Elevated white blood cell count, unspecified: Secondary | ICD-10-CM | POA: Insufficient documentation

## 2016-09-03 DIAGNOSIS — F39 Unspecified mood [affective] disorder: Secondary | ICD-10-CM | POA: Diagnosis not present

## 2016-09-03 DIAGNOSIS — N183 Chronic kidney disease, stage 3 (moderate): Secondary | ICD-10-CM | POA: Diagnosis not present

## 2016-09-03 DIAGNOSIS — G609 Hereditary and idiopathic neuropathy, unspecified: Secondary | ICD-10-CM | POA: Diagnosis not present

## 2016-09-03 DIAGNOSIS — Z79899 Other long term (current) drug therapy: Secondary | ICD-10-CM | POA: Insufficient documentation

## 2016-09-03 DIAGNOSIS — Z794 Long term (current) use of insulin: Secondary | ICD-10-CM | POA: Insufficient documentation

## 2016-09-03 DIAGNOSIS — R404 Transient alteration of awareness: Secondary | ICD-10-CM

## 2016-09-03 DIAGNOSIS — I129 Hypertensive chronic kidney disease with stage 1 through stage 4 chronic kidney disease, or unspecified chronic kidney disease: Secondary | ICD-10-CM | POA: Diagnosis not present

## 2016-09-03 DIAGNOSIS — R4020 Unspecified coma: Secondary | ICD-10-CM | POA: Diagnosis not present

## 2016-09-03 DIAGNOSIS — N184 Chronic kidney disease, stage 4 (severe): Secondary | ICD-10-CM | POA: Diagnosis present

## 2016-09-03 DIAGNOSIS — I672 Cerebral atherosclerosis: Secondary | ICD-10-CM | POA: Insufficient documentation

## 2016-09-03 DIAGNOSIS — Z21 Asymptomatic human immunodeficiency virus [HIV] infection status: Secondary | ICD-10-CM | POA: Diagnosis not present

## 2016-09-03 DIAGNOSIS — Z7982 Long term (current) use of aspirin: Secondary | ICD-10-CM | POA: Insufficient documentation

## 2016-09-03 DIAGNOSIS — E1140 Type 2 diabetes mellitus with diabetic neuropathy, unspecified: Secondary | ICD-10-CM | POA: Insufficient documentation

## 2016-09-03 DIAGNOSIS — Z89512 Acquired absence of left leg below knee: Secondary | ICD-10-CM | POA: Insufficient documentation

## 2016-09-03 DIAGNOSIS — Z820 Family history of epilepsy and other diseases of the nervous system: Secondary | ICD-10-CM | POA: Insufficient documentation

## 2016-09-03 DIAGNOSIS — E1110 Type 2 diabetes mellitus with ketoacidosis without coma: Secondary | ICD-10-CM | POA: Diagnosis not present

## 2016-09-03 DIAGNOSIS — E662 Morbid (severe) obesity with alveolar hypoventilation: Secondary | ICD-10-CM | POA: Diagnosis not present

## 2016-09-03 DIAGNOSIS — I252 Old myocardial infarction: Secondary | ICD-10-CM | POA: Diagnosis not present

## 2016-09-03 DIAGNOSIS — R000 Tachycardia, unspecified: Secondary | ICD-10-CM | POA: Diagnosis not present

## 2016-09-03 DIAGNOSIS — Z888 Allergy status to other drugs, medicaments and biological substances status: Secondary | ICD-10-CM | POA: Insufficient documentation

## 2016-09-03 DIAGNOSIS — Z8249 Family history of ischemic heart disease and other diseases of the circulatory system: Secondary | ICD-10-CM | POA: Insufficient documentation

## 2016-09-03 DIAGNOSIS — I517 Cardiomegaly: Secondary | ICD-10-CM | POA: Diagnosis not present

## 2016-09-03 DIAGNOSIS — R5383 Other fatigue: Secondary | ICD-10-CM | POA: Insufficient documentation

## 2016-09-03 DIAGNOSIS — E1159 Type 2 diabetes mellitus with other circulatory complications: Secondary | ICD-10-CM | POA: Diagnosis present

## 2016-09-03 LAB — CBC WITH DIFFERENTIAL/PLATELET
BASOS PCT: 0 %
Basophils Absolute: 0 10*3/uL (ref 0.0–0.1)
EOS ABS: 0.4 10*3/uL (ref 0.0–0.7)
Eosinophils Relative: 2 %
HEMATOCRIT: 41.8 % (ref 36.0–46.0)
HEMOGLOBIN: 12.3 g/dL (ref 12.0–15.0)
LYMPHS PCT: 12 %
Lymphs Abs: 2.1 10*3/uL (ref 0.7–4.0)
MCH: 26.7 pg (ref 26.0–34.0)
MCHC: 29.4 g/dL — ABNORMAL LOW (ref 30.0–36.0)
MCV: 90.7 fL (ref 78.0–100.0)
MONOS PCT: 5 %
Monocytes Absolute: 0.9 10*3/uL (ref 0.1–1.0)
Neutro Abs: 14.5 10*3/uL — ABNORMAL HIGH (ref 1.7–7.7)
Neutrophils Relative %: 81 %
Platelets: 299 10*3/uL (ref 150–400)
RBC: 4.61 MIL/uL (ref 3.87–5.11)
RDW: 21.2 % — ABNORMAL HIGH (ref 11.5–15.5)
WBC: 17.9 10*3/uL — ABNORMAL HIGH (ref 4.0–10.5)

## 2016-09-03 LAB — URINALYSIS, ROUTINE W REFLEX MICROSCOPIC
BILIRUBIN URINE: NEGATIVE
GLUCOSE, UA: 50 mg/dL — AB
HGB URINE DIPSTICK: NEGATIVE
KETONES UR: 5 mg/dL — AB
LEUKOCYTES UA: NEGATIVE
Nitrite: NEGATIVE
PROTEIN: 30 mg/dL — AB
SPECIFIC GRAVITY, URINE: 1.023 (ref 1.005–1.030)
pH: 5 (ref 5.0–8.0)

## 2016-09-03 LAB — I-STAT ARTERIAL BLOOD GAS, ED
ACID-BASE EXCESS: 4 mmol/L — AB (ref 0.0–2.0)
BICARBONATE: 30.4 mmol/L — AB (ref 20.0–28.0)
O2 SAT: 88 %
PO2 ART: 58 mmHg — AB (ref 83.0–108.0)
TCO2: 32 mmol/L (ref 22–32)
pCO2 arterial: 53.6 mmHg — ABNORMAL HIGH (ref 32.0–48.0)
pH, Arterial: 7.361 (ref 7.350–7.450)

## 2016-09-03 LAB — COMPREHENSIVE METABOLIC PANEL
ALBUMIN: 3.3 g/dL — AB (ref 3.5–5.0)
ALK PHOS: 69 U/L (ref 38–126)
ALT: 24 U/L (ref 14–54)
ANION GAP: 8 (ref 5–15)
AST: 26 U/L (ref 15–41)
BUN: 33 mg/dL — AB (ref 6–20)
CALCIUM: 8.7 mg/dL — AB (ref 8.9–10.3)
CO2: 26 mmol/L (ref 22–32)
Chloride: 105 mmol/L (ref 101–111)
Creatinine, Ser: 1.44 mg/dL — ABNORMAL HIGH (ref 0.44–1.00)
GFR calc Af Amer: 47 mL/min — ABNORMAL LOW (ref >60)
GFR calc non Af Amer: 41 mL/min — ABNORMAL LOW (ref >60)
GLUCOSE: 255 mg/dL — AB (ref 65–99)
Potassium: 4.8 mmol/L (ref 3.5–5.1)
SODIUM: 139 mmol/L (ref 135–145)
Total Bilirubin: 0.8 mg/dL (ref 0.3–1.2)
Total Protein: 6.1 g/dL — ABNORMAL LOW (ref 6.5–8.1)

## 2016-09-03 LAB — I-STAT CG4 LACTIC ACID, ED: Lactic Acid, Venous: 1.05 mmol/L (ref 0.5–1.9)

## 2016-09-03 LAB — AMMONIA: AMMONIA: 48 umol/L — AB (ref 9–35)

## 2016-09-03 LAB — RAPID URINE DRUG SCREEN, HOSP PERFORMED
AMPHETAMINES: NOT DETECTED
BARBITURATES: NOT DETECTED
Benzodiazepines: POSITIVE — AB
Cocaine: NOT DETECTED
OPIATES: POSITIVE — AB
TETRAHYDROCANNABINOL: NOT DETECTED

## 2016-09-03 LAB — I-STAT TROPONIN, ED: TROPONIN I, POC: 0 ng/mL (ref 0.00–0.08)

## 2016-09-03 LAB — CBG MONITORING, ED: Glucose-Capillary: 250 mg/dL — ABNORMAL HIGH (ref 65–99)

## 2016-09-03 LAB — ETHANOL: Alcohol, Ethyl (B): <5 mg/dL (ref <5)

## 2016-09-03 MED ORDER — ASPIRIN EC 81 MG PO TBEC
81.0000 mg | DELAYED_RELEASE_TABLET | Freq: Every day | ORAL | Status: DC
  Administered 2016-09-04: 81 mg via ORAL
  Filled 2016-09-03: qty 1

## 2016-09-03 MED ORDER — FENOFIBRATE 160 MG PO TABS
160.0000 mg | ORAL_TABLET | Freq: Every day | ORAL | Status: DC
  Filled 2016-09-03 (×2): qty 1

## 2016-09-03 MED ORDER — LIRAGLUTIDE 18 MG/3ML ~~LOC~~ SOPN: 1.8000 mg | PEN_INJECTOR | Freq: Every day | SUBCUTANEOUS | Status: DC

## 2016-09-03 MED ORDER — INSULIN ASPART 100 UNIT/ML ~~LOC~~ SOLN: 0.0000 [IU] | Freq: Three times a day (TID) | SUBCUTANEOUS | Status: DC

## 2016-09-03 MED ORDER — ACETAMINOPHEN 325 MG PO TABS: 650.0000 mg | ORAL_TABLET | Freq: Four times a day (QID) | ORAL | Status: DC | PRN

## 2016-09-03 MED ORDER — VITAMIN D 1000 UNITS PO TABS
5000.0000 [IU] | ORAL_TABLET | Freq: Every day | ORAL | Status: DC
  Administered 2016-09-04: 5000 [IU] via ORAL
  Filled 2016-09-03: qty 5

## 2016-09-03 MED ORDER — ROSUVASTATIN CALCIUM 20 MG PO TABS
20.0000 mg | ORAL_TABLET | Freq: Every day | ORAL | Status: DC
  Administered 2016-09-04: 20 mg via ORAL
  Filled 2016-09-03: qty 1
  Filled 2016-09-03: qty 2

## 2016-09-03 MED ORDER — PANTOPRAZOLE SODIUM 40 MG PO TBEC
40.0000 mg | DELAYED_RELEASE_TABLET | Freq: Every day | ORAL | Status: DC
  Administered 2016-09-04: 40 mg via ORAL
  Filled 2016-09-03: qty 1

## 2016-09-03 MED ORDER — FUROSEMIDE 20 MG PO TABS
80.0000 mg | ORAL_TABLET | Freq: Every day | ORAL | Status: DC
Start: 2016-09-04 — End: 2016-09-04
  Administered 2016-09-04: 80 mg via ORAL
  Filled 2016-09-03: qty 4

## 2016-09-03 MED ORDER — ARIPIPRAZOLE 10 MG PO TABS
10.0000 mg | ORAL_TABLET | Freq: Every day | ORAL | Status: DC
  Administered 2016-09-04: 10 mg via ORAL
  Filled 2016-09-03: qty 1

## 2016-09-03 MED ORDER — BUDESONIDE 0.5 MG/2ML IN SUSP
2.0000 mL | Freq: Two times a day (BID) | RESPIRATORY_TRACT | Status: DC
  Administered 2016-09-04: 0.5 mg via RESPIRATORY_TRACT
  Filled 2016-09-03 (×2): qty 2

## 2016-09-03 MED ORDER — ACETAMINOPHEN 650 MG RE SUPP: 650.0000 mg | Freq: Four times a day (QID) | RECTAL | Status: DC | PRN

## 2016-09-03 MED ORDER — IPRATROPIUM-ALBUTEROL 0.5-2.5 (3) MG/3ML IN SOLN
3.0000 mL | Freq: Once | RESPIRATORY_TRACT | Status: AC
  Administered 2016-09-03: 3 mL via RESPIRATORY_TRACT
  Filled 2016-09-03: qty 3

## 2016-09-03 MED ORDER — INSULIN GLARGINE 100 UNIT/ML ~~LOC~~ SOLN
65.0000 [IU] | Freq: Two times a day (BID) | SUBCUTANEOUS | Status: DC
  Administered 2016-09-04: 65 [IU] via SUBCUTANEOUS
  Filled 2016-09-03: qty 0.65

## 2016-09-03 MED ORDER — ALBUTEROL SULFATE (2.5 MG/3ML) 0.083% IN NEBU: 2.5000 mg | INHALATION_SOLUTION | RESPIRATORY_TRACT | Status: DC | PRN

## 2016-09-03 MED ORDER — ONDANSETRON HCL 4 MG/2ML IJ SOLN: 4.0000 mg | Freq: Four times a day (QID) | INTRAMUSCULAR | Status: DC | PRN

## 2016-09-03 MED ORDER — POTASSIUM CHLORIDE CRYS ER 20 MEQ PO TBCR: 20.0000 meq | EXTENDED_RELEASE_TABLET | Freq: Every day | ORAL | Status: DC

## 2016-09-03 MED ORDER — ENOXAPARIN SODIUM 40 MG/0.4ML ~~LOC~~ SOLN
40.0000 mg | Freq: Every day | SUBCUTANEOUS | Status: DC
  Filled 2016-09-03: qty 0.4

## 2016-09-03 MED ORDER — MAGNESIUM OXIDE 400 (241.3 MG) MG PO TABS
400.0000 mg | ORAL_TABLET | Freq: Every day | ORAL | Status: DC
  Filled 2016-09-03: qty 1

## 2016-09-03 MED ORDER — ALBUTEROL SULFATE (2.5 MG/3ML) 0.083% IN NEBU
2.5000 mg | INHALATION_SOLUTION | RESPIRATORY_TRACT | Status: DC
  Administered 2016-09-04 (×3): 2.5 mg via RESPIRATORY_TRACT
  Filled 2016-09-03 (×3): qty 3

## 2016-09-03 MED ORDER — DULOXETINE HCL 60 MG PO CPEP
60.0000 mg | ORAL_CAPSULE | Freq: Every day | ORAL | Status: DC
  Administered 2016-09-04: 60 mg via ORAL
  Filled 2016-09-03 (×2): qty 1

## 2016-09-03 MED ORDER — METHYLPREDNISOLONE SODIUM SUCC 125 MG IJ SOLR
125.0000 mg | Freq: Once | INTRAMUSCULAR | Status: AC
  Administered 2016-09-03: 125 mg via INTRAVENOUS
  Filled 2016-09-03: qty 2

## 2016-09-03 MED ORDER — ELVITEG-COBIC-EMTRICIT-TENOFAF 150-150-200-10 MG PO TABS
1.0000 | ORAL_TABLET | Freq: Every day | ORAL | Status: DC
  Filled 2016-09-03 (×2): qty 1

## 2016-09-03 MED ORDER — NALOXONE HCL 0.4 MG/ML IJ SOLN
0.4000 mg | Freq: Once | INTRAMUSCULAR | Status: AC
  Administered 2016-09-03: 0.4 mg via INTRAVENOUS
  Filled 2016-09-03: qty 1

## 2016-09-03 MED ORDER — ONDANSETRON HCL 4 MG PO TABS: 4.0000 mg | ORAL_TABLET | Freq: Four times a day (QID) | ORAL | Status: DC | PRN

## 2016-09-03 MED ORDER — IPRATROPIUM BROMIDE 0.02 % IN SOLN
0.5000 mg | RESPIRATORY_TRACT | Status: DC
  Administered 2016-09-04 (×3): 0.5 mg via RESPIRATORY_TRACT
  Filled 2016-09-03 (×3): qty 2.5

## 2016-09-03 NOTE — ED Notes (Signed)
Patient transported to CT 

## 2016-09-03 NOTE — ED Notes (Signed)
[name] [phone])- called and wanted update on the PT. He currently lives out of town.

## 2016-09-03 NOTE — ED Provider Notes (Signed)
I saw and evaluated the patient, reviewed the resident's note and I agree with the findings and plan.  Pertinent History: The patient is a 51 year old female, very obese, prior left-sided below-the-knee amputation, reportedly an uncontrolled diabetic, questionable chronic respiratory failure with recurrent hypercapnic respiratory failure as well as a history of reactive airway disease. Unfortunately the patient is unable to give [place] any information as she is somnolent and was found to be hypotensive and diaphoretic by the paramedics who also found her to be tachycardic to 120. She was slightly hyperglycemic. IV access was obtained in the field, 300 mL of normal saline was given prehospital. The patient has had no significant improvement in her mental status. No other history is available vessel level V caveat is applied secondary to altered mental status  Pertinent Exam findings: on exam the patient is tachycardic to 120 bpm, she has a soft nontender abdomen, she has multiple sites of bruising on her bilateral arms, she does have palpable femoral pulses but does not have palpable radial artery pulses. I see no JVD, no significant peripheral edema, her mental status is definitely declined, she is able to open her eyes to speech, answers my questions though she does so very quietly and slowly and immediately drifts back to sleep. The patient does not appear to be in respiratory distress though I do feel like she has some subtle rales and wheezing when she takes a deep breath. She is not using accessory muscles.  The patient does appear to be critically ill with aaltered mental status and potentially abnormal vital signs including hypoxia hypotension and tachycardia. We'll initiate a sepsis workup, cardiac workup, cardiac monitoring, repeat neurologic exam, EKG does not show acute ischemia.    EKG Interpretation  Date/Time:  Monday September 03 2016 20:05:33 EDT Ventricular Rate:  119 PR Interval:    QRS  Duration: 87 QT Interval:  307 QTC Calculation: 432 R Axis:   -23 Text Interpretation:  Sinus tachycardia Borderline left axis deviation Low voltage, precordial leads Abnormal R-wave progression, early transition since last tracing no significant change Confirmed by Noemi Chapel [phone]) on 09/03/2016 8:13:53 PM       I personally interpreted the EKG as well as the resident and agree with the interpretation on the resident's chart.  Final diagnoses:  Generalized weakness  Transient alteration of awareness      Noemi Chapel, MD 09/05/16 [phone]

## 2016-09-03 NOTE — ED Triage Notes (Addendum)
Pt brought in by [facility] from home c/o nausea, diaphoresis, and generalized weakness. Pt tachy at 120 and hypotensive w/ a systolic of 80 palpated. Given 4mg  of zofran PO. Pt states she does not wear O2 at home, 91% on 2L. Pt is a poor historian and states she called EMS b/c nobody was home with her. Pt denies SOB, diarrhea, or difficulty urinating.

## 2016-09-03 NOTE — ED Provider Notes (Cosign Needed)
[facility] DEPT Provider Note   CSN: [id number] Arrival date & time: 09/03/16  1958     History   Chief Complaint Chief Complaint  Patient presents with  . Weakness    HPI [name] is a 51 y.o. female.  This is a 50 year old female with obesity, poorly controlled T2 DM, CKD, chronic respiratory failure with recurrent hypercapnic failure and reactive airway disease who presents via EMS from home with altered mental status and hypotension.  Patient unable to provide many details due to her somnolence.  She is found to be initially tachycardic in the 120s, she was also hypotensive in the 93Z systolic.  In the field IV access was obtained and she was given 400 mL of normal saline in route to the hospital.  Upon arrival patient unable to provide details on the reason for her visit.  No family members present for questioning.  Medical chart reviewed.  Patient was recently discharged from the hospital approximately 10 days prior for an asthma exacerbation along with hyponatremia and for management of T2 DM.   The history is provided by the EMS personnel and medical records.    Past Medical History:  Diagnosis Date  . Acute metabolic encephalopathy 1/69/6789  . Acute on chronic respiratory failure with hypoxia ([place]) 05/19/2016  . Anxiety   . ARF (acute renal failure) ([place]) 05/03/2014  . Arthritis    LEFT HIP  . Asthma    HOSPITALIZED WITH EXCERBATION OF ASTHMA - AND BRONCHITIS AND THE FLU DEC 2013  . Asthma exacerbation attacks 12/28/2011  . Bronchitis 12/28/2011  . CAP (community acquired pneumonia) 07/25/2012  . Chronic kidney disease (CKD), stage IV (severe) ([place]) 11/10/2014  . Class 3 obesity due to excess calories with serious comorbidity and body mass index (BMI) of 40.0 to 44.9 in adult [facility])   . Colles' fracture of left radius   . Depression   . Diabetes mellitus    ON INSULIN AND ORAL MEDICATIONS  . Diabetes mellitus type 2 with complications, uncontrolled ([place])  04/19/2008   Qualifier: Diagnosis of  By: Tomma Lightning MD, Claiborne Billings     . Diabetic foot infection ([place]) 11/09/2014  . Diabetic ketoacidosis without coma associated with type 2 diabetes mellitus ([place])   . Diabetic neuropathy ([place]) 11/10/2014  . Diarrhea 08/30/2008   Qualifier: Diagnosis of  By: Tomma Lightning MD, Claiborne Billings    . Diastolic dysfunction 38/01/173  . Difficult intravenous access   . DKA, type 2 ([place]) 04/04/2016  . Dyslipidemia 12/23/2008   Qualifier: Diagnosis of  By: Tomma Lightning MD, Claiborne Billings    . Essential hypertension 04/19/2008   Qualifier: Diagnosis of  By: Tomma Lightning MD, Claiborne Billings    . FATIGUE 07/12/2008   Qualifier: Diagnosis of  By: Tomma Lightning MD, Claiborne Billings    . Femoral neck fracture ([place]) 07/16/2011  . Femur fracture, left ([place]) 07/08/2014  . Fever   . Fracture of distal femur ([place]) 07/08/2014  . GERD (gastroesophageal reflux disease)   . Hereditary and idiopathic peripheral neuropathy 04/19/2008   Qualifier: Diagnosis of  By: Tomma Lightning MD, Claiborne Billings    . Hip fracture, left ([place]) 07/16/2011  . Hip pain   . HIV infection ([place]) 2000  . Human immunodeficiency virus (HIV) disease ([place]) 04/19/2008   [id number] +   . Hyperlipidemia   . Hypertension   . Hyponatremia 12/29/2011  . Influenza A 12/29/2011  . Influenza B   . Insomnia 06/12/2016  . Left hip pain 05/03/2014  . Mood disorder ([place]) 04/19/2008   Qualifier: Diagnosis  of  By: Tomma Lightning MD, Claiborne Billings    . Neuropathy    NEUROPATHY HANDS AND FEET  . NSTEMI (non-ST elevated myocardial infarction) ([place]) 05/19/2016  . Obesity hypoventilation syndrome ([place])   . Osteoporosis 07/08/2014  . Pain    SEVERE PAIN LEFT HIP - HX OF LEFT HIP PINNING JULY 2013  . Perimenopausal symptoms 02/13/2012   LMP around 2011. On estrace and provera since around 2012 for hot flashes, mood swings. Estrace 2 mg daily, Provera 2.5 mg for 5 days each month.   . Repeated falls 07/30/2011  . Sepsis ([place]) 04/04/2016  . Shortness of breath    ALLERGIES ARE "ACTING UP"  . SOB (shortness of breath)   . THRUSH  05/20/2008   Qualifier: Diagnosis of  By: Tomma Lightning MD, Claiborne Billings      . Tobacco use disorder 04/14/2010    Patient Active Problem List   Diagnosis Date Noted  . Acute encephalopathy 09/03/2016  . Acute on chronic respiratory failure with hypoxia and hypercapnia ([place]) 08/19/2016  . Leukocytosis 08/19/2016  . CKD (chronic kidney disease), stage III 08/19/2016  . Altered mental status 08/19/2016  . Hyperglycemia 08/19/2016  . Asthma exacerbation 06/18/2016  . Insomnia 06/12/2016  . Difficult intravenous access   . Acute on chronic respiratory failure with hypoxia ([place]) 05/19/2016  . NSTEMI (non-ST elevated myocardial infarction) ([place]) 05/19/2016  . Acute metabolic encephalopathy 33/83/2919  . Diabetic ketoacidosis without coma associated with type 2 diabetes mellitus ([place])   . SOB (shortness of breath)   . Hyperlipidemia   . Class 3 obesity due to excess calories with serious comorbidity and body mass index (BMI) of 40.0 to 44.9 in adult [facility])   . Obesity hypoventilation syndrome ([place])   . DKA, type 2 ([place]) 04/04/2016  . Diastolic dysfunction 16/60/6004  . Diabetic neuropathy ([place]) 11/10/2014  . Chronic kidney disease (CKD), stage IV (severe) ([place]) 11/10/2014  . Fracture of distal femur ([place]) 07/08/2014  . Osteoporosis 07/08/2014  . Femur fracture, left ([place]) 07/08/2014  . Colles' fracture of left radius   . Left hip pain 05/03/2014  . Hip pain   . Perimenopausal symptoms 02/13/2012  . Hyponatremia 12/29/2011  . Asthma exacerbation attacks 12/28/2011  . Bronchitis 12/28/2011  . Repeated falls 07/30/2011  . Hip fracture, left ([place]) 07/16/2011  . Femoral neck fracture ([place]) 07/16/2011  . Dyslipidemia 12/23/2008  . THRUSH 05/20/2008  . Human immunodeficiency virus (HIV) disease ([place]) 04/19/2008  . Diabetes mellitus type 2 with complications, uncontrolled ([place]) 04/19/2008  . Mood disorder ([place]) 04/19/2008  . Hereditary and idiopathic peripheral neuropathy 04/19/2008  . Essential  hypertension 04/19/2008    Past Surgical History:  Procedure Laterality Date  . AMPUTATION Left 11/18/2014   Procedure: LEFT BELOW KNEE AMPUTATION;  Surgeon: Leandrew Koyanagi, MD;  Location: [facility];  Service: Orthopedics;  Laterality: Left;  . CAST APPLICATION Left 05/09/9772   Procedure: CAST APPLICATION (FIBERGLASS);  Surgeon: Latanya Maudlin, MD;  Location: [facility];  Service: Orthopedics;  Laterality: Left;  CLOSED REDUCTION OF LEFT COLLES FRACTURE WITH SHORT ARM CAST  . HARDWARE REMOVAL Left 05/05/2014   Procedure: HARDWARE REMOVAL LEFT HIP;  Surgeon: Latanya Maudlin, MD;  Location: [facility];  Service: Orthopedics;  Laterality: Left;  REMOVAL BIOMET 6.5-8.0 CANNULATED SCREW  . HIP ARTHROPLASTY Left 05/05/2014   Procedure: ARTHROPLASTY OPEN REDUCTION INTERNAL FIXATION LEFT HIP AND REMOVAL OF TWO CANNULATED SCREW;  Surgeon: Latanya Maudlin, MD;  [facility];  Service: Orthopedics;  Laterality: Left;  . HIP PINNING,CANNULATED  07/16/2011   Procedure: CANNULATED HIP PINNING;  Surgeon: Gearlean Alf, MD;  [facility];  Service: Orthopedics;  Laterality: Left;  . HIP PINNING,CANNULATED Left 04/09/2012   Procedure: CANNULATED HIP PINNING AND HARDWARE REVISION;  Surgeon: Gearlean Alf, MD;  [facility]: [facility];  Service: Orthopedics;  Laterality: Left;  . I&D EXTREMITY Left 11/16/2014   Procedure:  DEBRIDEMENT OF LEFT FOOT POSSIBLE APPLICATION OF INTEGRIA AND VAC ;  Surgeon: Irene Limbo, MD;  Location: [facility];  Service: Plastics;  Laterality: Left;  . PERIPHERAL VASCULAR CATHETERIZATION N/A 11/15/2014   Procedure: Abdominal Aortogram;  Surgeon: Conrad [place], MD;  Location: [facility];  Service: Cardiovascular;  Laterality: N/A;  . PERIPHERAL VASCULAR CATHETERIZATION  11/15/2014   Procedure: Lower Extremity Angiography;  Surgeon: Conrad [place], MD;  Location: [facility];  Service: Cardiovascular;;  . PERIPHERAL VASCULAR CATHETERIZATION Left 11/15/2014   Procedure: Peripheral Vascular  Intervention;  Surgeon: Conrad [place], MD;  Location: [facility];  Service: Cardiovascular;  Laterality: Left;  popliteal artery stenting    OB History    Gravida Para Term Preterm AB Living   2 1 1  0 1 1   SAB TAB Ectopic Multiple Live Births   1   0 0         Home Medications    Prior to Admission medications   Medication Sig Start Date End Date Taking? Authorizing Provider  ARIPiprazole (ABILIFY) 10 MG tablet Take 10 mg by mouth daily. 08/16/16   [provider]  aspirin EC 81 MG EC tablet Take 1 tablet (81 mg total) by mouth daily. Patient taking differently: Take 81 mg by mouth.  05/25/16   Rai, Ripudeep K, MD  Blood Glucose Monitoring Suppl (ACCU-CHEK AVIVA PLUS) w/Device KIT USE TO TEST BLOOD SUGAR DAILY 07/03/16   Elayne Snare, MD  budesonide (PULMICORT) 0.5 MG/2ML nebulizer solution Take 2 mLs by nebulization daily. 08/03/16   [provider]  budesonide-formoterol (SYMBICORT) 80-4.5 MCG/ACT inhaler Inhale 2 puffs into the lungs 2 (two) times daily.    [provider]  Cholecalciferol (VITAMIN D3) 5000 UNITS TABS Take 5,000 Units by mouth daily.     [provider]  clonazePAM (KLONOPIN) 0.5 MG tablet Take 0.5 mg by mouth 3 (three) times daily as needed for anxiety.  08/16/16   [provider]  diphenoxylate-atropine (LOMOTIL) 2.5-0.025 MG tablet Take 1 tablet by mouth 4 (four) times daily as needed for diarrhea or loose stools. 06/12/16   Campbell Riches, MD  DULoxetine (CYMBALTA) 60 MG capsule Take 60 mg by mouth daily. 08/16/16   [provider]  Eszopiclone 3 MG TABS Take 1 tablet (3 mg total) by mouth at bedtime as needed. Take immediately before bedtime Patient taking differently: Take 3 mg by mouth at bedtime as needed (sleep). Take immediately before bedtime  06/12/16   Campbell Riches, MD  fenofibrate (TRICOR) 145 MG tablet Take 145 mg by mouth daily.    [provider]  furosemide (LASIX) 80 MG tablet  Take 80 mg by mouth daily.    [provider]  GENVOYA 150-150-200-10 MG TABS tablet TAKE 1 TABLET BY MOUTH EVERY DAY 12/19/15   Campbell Riches, MD  glucose blood (ACCU-CHEK AVIVA) test strip Use to test blood sugar 3 times daily 07/03/16   Elayne Snare, MD  HYDROcodone-acetaminophen (NORCO) 10-325 MG tablet Take 1 tablet by mouth every 8 (eight) hours as needed (pain).  [provider]  Insulin Glargine (TOUJEO SOLOSTAR) 300 UNIT/ML SOPN Inject 70 Units into the skin 2 (two) times daily. 08/21/16   Geradine Girt, DO  insulin regular human CONCENTRATED (HUMULIN R U-500 KWIKPEN) 500 UNIT/ML kwikpen Inject 35-60 Units into the skin See admin instructions. Inject 60 units subcutaneously with breakfast, 50 units with lunch and 35 units with supper    [provider]  ipratropium-albuterol (DUONEB) 0.5-2.5 (3) MG/3ML SOLN Take 3 mLs by nebulization 3 (three) times daily. Patient taking differently: Take 3 mLs by nebulization every 6 (six) hours as needed (shortness of breath/wheezing).  05/25/16   Rai, Ripudeep K, MD  liraglutide (VICTOZA) 18 MG/3ML SOPN Inject 1.8 mg into the skin daily.    [provider]  magnesium oxide (MAG-OX) 400 MG tablet Take 400 mg by mouth daily.    [provider]  pantoprazole (PROTONIX) 40 MG tablet Take 40 mg by mouth daily. 09/02/14   [provider]  potassium chloride SA (K-DUR,KLOR-CON) 20 MEQ tablet Take 1 tablet (20 mEq total) by mouth daily. X 3 days, then check potassium levels if needed to continue Patient taking differently: Take 20 mEq by mouth daily.  05/25/16   Rai, Vernelle Emerald, MD  predniSONE (DELTASONE) 10 MG tablet 50 mg PO x 1 day, then 35m x1 then 30 mg x 1 day, then 20 mg x1 then 165mx 1 day and stop 08/21/16   VaEulogio Bear, DO  pregabalin (LYRICA) 225 MG capsule Take 225 mg by mouth 2 (two) times daily.    [provider]  rosuvastatin (CRESTOR) 20 MG tablet Take 20 mg by mouth daily.  Reported on 05/02/2015 09/02/14   [provider]  temazepam (RESTORIL) 30 MG capsule Take 30 mg by mouth at bedtime. 08/16/16   [provider]  tiZANidine (ZANAFLEX) 4 MG tablet Take 4 mg by mouth daily.  03/06/16   [provider]    Family History Family History  Problem Relation Age of Onset  . Diabetes Mother   . Hypertension Mother   . Vision loss Mother   . Heart disease Mother   . Hypertension Father   . Thyroid disease Neg Hx     Social History Social History  Substance Use Topics  . Smoking status: Former Smoker    Packs/day: 0.50    Years: 35.00    Types: E-cigarettes, Cigarettes    Quit date: 08/01/2016  . Smokeless tobacco: Never Used  . Alcohol use No     Comment: no drink since 2008     Allergies   Cortizone-10 [hydrocortisone]   Review of Systems Review of Systems  Unable to perform ROS: Mental status change     Physical Exam Updated Vital Signs BP 133/70   Pulse (!) 106   Temp 99.9 F (37.7 C) (Rectal)   Resp (!) 29   Ht 5' 10"  (1.778 m)   Wt 97.5 kg (215 lb)   LMP 11/16/2010   SpO2 93%   BMI 30.85 kg/m   Physical Exam  Constitutional: She appears lethargic. She is uncooperative. She appears ill. No distress.  HENT:  Head: Normocephalic and atraumatic.  Eyes: Conjunctivae are normal.  Neck: Neck supple.  Cardiovascular: Normal rate and regular rhythm.   No murmur heard. Pulmonary/Chest: Effort normal and breath sounds normal. No respiratory distress.  Abdominal: Soft. There is no tenderness.  Musculoskeletal: She exhibits no edema.  Neurological: She appears lethargic. She is disoriented.  Cannot perform neurological examination  due to patient's lethargy, noncooperation.  Skin: Skin is warm and dry. She is not diaphoretic.  Psychiatric: She has a normal mood and affect.  Nursing note and vitals reviewed.    ED Treatments / Results  Labs (all labs ordered are listed, but only abnormal results are  displayed) Labs Reviewed  COMPREHENSIVE METABOLIC PANEL - Abnormal; Notable for the following:       Result Value   Glucose, Bld 255 (*)    BUN 33 (*)    Creatinine, Ser 1.44 (*)    Calcium 8.7 (*)    Total Protein 6.1 (*)    Albumin 3.3 (*)    GFR calc non Af Amer 41 (*)    GFR calc Af Amer 47 (*)    All other components within normal limits  CBC WITH DIFFERENTIAL/PLATELET - Abnormal; Notable for the following:    WBC 17.9 (*)    MCHC 29.4 (*)    RDW 21.2 (*)    Neutro Abs 14.5 (*)    All other components within normal limits  AMMONIA - Abnormal; Notable for the following:    Ammonia 48 (*)    All other components within normal limits  URINALYSIS, ROUTINE W REFLEX MICROSCOPIC - Abnormal; Notable for the following:    APPearance HAZY (*)    Glucose, UA 50 (*)    Ketones, ur 5 (*)    Protein, ur 30 (*)    Bacteria, UA RARE (*)    Squamous Epithelial / LPF 0-5 (*)    All other components within normal limits  RAPID URINE DRUG SCREEN, HOSP PERFORMED - Abnormal; Notable for the following:    Opiates POSITIVE (*)    Benzodiazepines POSITIVE (*)    All other components within normal limits  I-STAT ARTERIAL BLOOD GAS, ED - Abnormal; Notable for the following:    pCO2 arterial 53.6 (*)    pO2, Arterial 58.0 (*)    Bicarbonate 30.4 (*)    Acid-Base Excess 4.0 (*)    All other components within normal limits  CBG MONITORING, ED - Abnormal; Notable for the following:    Glucose-Capillary 250 (*)    All other components within normal limits  CULTURE, BLOOD (ROUTINE X 2)  CULTURE, BLOOD (ROUTINE X 2)  ETHANOL  AMMONIA  BASIC METABOLIC PANEL  CBC  CBC  CREATININE, SERUM  I-STAT TROPONIN, ED  I-STAT CG4 LACTIC ACID, ED    EKG  EKG Interpretation  Date/Time:  Monday September 03 2016 20:05:33 EDT Ventricular Rate:  119 PR Interval:    QRS Duration: 87 QT Interval:  307 QTC Calculation: 432 R Axis:   -23 Text Interpretation:  Sinus tachycardia Borderline left axis  deviation Low voltage, precordial leads Abnormal R-wave progression, early transition since last tracing no significant change Confirmed by Noemi Chapel [id number]) on 09/03/2016 8:13:53 PM       Radiology Ct Head Wo Contrast  Result Date: 09/03/2016 CLINICAL DATA:  51 year old female with altered level of consciousness and generalized weakness. EXAM: CT HEAD WITHOUT CONTRAST TECHNIQUE: Contiguous axial images were obtained from the base of the skull through the vertex without intravenous contrast. COMPARISON:  08/19/2016 and 05/20/2016 head CTs FINDINGS: Brain: No evidence of acute infarction, hemorrhage, hydrocephalus, extra-axial collection or mass lesion/mass effect. Mild atrophy and chronic small-vessel white matter ischemic changes again noted. Vascular: Mild intracranial atherosclerotic calcifications. Skull: Normal. Negative for fracture or focal lesion. Sinuses/Orbits: No acute finding. Other: None. IMPRESSION: 1. No evidence of acute intracranial abnormality 2. Mild  atrophy and chronic small-vessel white matter ischemic changes. Electronically Signed   By: Margarette Canada M.D.   On: 09/03/2016 21:53   Dg Chest Portable 1 View  Result Date: 09/03/2016 CLINICAL DATA:  Nausea in general weakness. EXAM: PORTABLE CHEST 1 VIEW COMPARISON:  August 19, 2016 FINDINGS: The heart size and mediastinal contours are stable. The heart size is enlarged. Both lungs are clear. Chronic posttraumatic deformities of bilateral ribs are identified. IMPRESSION: No active cardiopulmonary disease. Electronically Signed   By: Abelardo Diesel M.D.   On: 09/03/2016 20:32    Procedures Procedures (including critical care time)  Medications Ordered in ED Medications  budesonide (PULMICORT) nebulizer solution 0.5 mg (not administered)  ARIPiprazole (ABILIFY) tablet 10 mg (not administered)  DULoxetine (CYMBALTA) DR capsule 60 mg (not administered)  fenofibrate tablet 160 mg (not administered)  furosemide (LASIX) tablet 80 mg  (not administered)  Insulin Glargine SOPN 70 Units (not administered)  liraglutide SOPN 1.8 mg (not administered)  magnesium oxide (MAG-OX) tablet 400 mg (not administered)  aspirin EC tablet 81 mg (not administered)  potassium chloride SA (K-DUR,KLOR-CON) CR tablet 20 mEq (not administered)  elvitegravir-cobicistat-emtricitabine-tenofovir (GENVOYA) 150-150-200-10 MG tablet 1 tablet (not administered)  pantoprazole (PROTONIX) EC tablet 40 mg (not administered)  rosuvastatin (CRESTOR) tablet 20 mg (not administered)  Vitamin D3 TABS 5,000 Units (not administered)  acetaminophen (TYLENOL) tablet 650 mg (not administered)    Or  acetaminophen (TYLENOL) suppository 650 mg (not administered)  ondansetron (ZOFRAN) tablet 4 mg (not administered)    Or  ondansetron (ZOFRAN) injection 4 mg (not administered)  insulin aspart (novoLOG) injection 0-9 Units (not administered)  enoxaparin (LOVENOX) injection 40 mg (not administered)  albuterol (PROVENTIL) (2.5 MG/3ML) 0.083% nebulizer solution 2.5 mg (not administered)  albuterol (PROVENTIL) (2.5 MG/3ML) 0.083% nebulizer solution 2.5 mg (not administered)  ipratropium (ATROVENT) nebulizer solution 0.5 mg (not administered)  naloxone (NARCAN) injection 0.4 mg (0.4 mg Intravenous Given 09/03/16 2147)  ipratropium-albuterol (DUONEB) 0.5-2.5 (3) MG/3ML nebulizer solution 3 mL (3 mLs Nebulization Given 09/03/16 2253)  methylPREDNISolone sodium succinate (SOLU-MEDROL) 125 mg/2 mL injection 125 mg (125 mg Intravenous Given 09/03/16 2253)     Initial Impression / Assessment and Plan / ED Course  I have reviewed the triage vital signs and the nursing notes.  Pertinent labs & imaging results that were available during my care of the patient were reviewed by me and considered in my medical decision making (see chart for details).     This is a 51 year old female with obesity, poorly controlled T2 DM, CKD, chronic respiratory failure with recurrent hypercapnic  failure and reactive airway disease who presents via EMS from home with altered mental status and hypotension.   Exam as noted above.  Patient has BKA on the left.  Broad laboratory studies ordered to evaluate for patient's confusion including respiratory, infectious, cardiac, neurological etiology. I panel unremarkable, ABG 7.36 and reflects chronic respiratory disease with CO2 53, oxygen 58, bicarbonate 30. Narcan given but no change in clinical status.  Urine drug screen performed.  Patient is on oxygen as she is at home with no change. Lactate 1.05. Urine studies largely unremarkable for infectious etiology. Leukocytosis noted on auditory studies with WBC count 17.9. Creatinine stable, hyperglycemia but not as significant compared to prior laboratory studies.  Ammonia elevated at 48 from 30 on 8/19.UDS positive for opiates and Benzos.  Patient given duo nebs and Solu-Medrol for her asthma.  At this time after interpreting laboratory values and imaging, suspect possible polypharmacy issue.  Patient is prescribed Lyrica and multiple mental status altering medications.  Spoke with hospitalist for admission and plan agreed upon.  Final Clinical Impressions(s) / ED Diagnoses   Final diagnoses:  Generalized weakness  Transient alteration of awareness    New Prescriptions New Prescriptions   No medications on file     Aldona Lento, MD 09/03/16 2357

## 2016-09-03 NOTE — ED Notes (Signed)
Portable Xray at bedside.

## 2016-09-03 NOTE — H&P (Addendum)
History and Physical    [name] XBJ:[id number] DOB: 05-22-1965 DOA: 09/03/2016  PCP: Michel Harrow, PA-C  Patient coming from: Home.  Chief Complaint: Lethargy.  HPI: [name] is a 51 y.o. female with history of asthma, diabetes mellitus type 2, chronic kidney disease was brought to the ER after patient was getting increasingly lethargic. Patient states over the last 2 days patient was feeling weak and lethargic and called EMS. Denies any new medications recently. Was recently admitted for asthma and encephalopathy 2 weeks ago and patient signed out [place].   ED Course: In the ER patient is found to be lethargic but oriented to her name and follows commands. ABG shows mild hypercarbia with respiratory acidosis. Urine does screen is positive for benzos and opiates. Patient takes these medications. Patient is being admitted for further observation. Placed on BiPAP.  Review of Systems: As per HPI, rest all negative.   Past Medical History:  Diagnosis Date  . Acute metabolic encephalopathy 03/08/6576  . Acute on chronic respiratory failure with hypoxia ([place]) 05/19/2016  . Anxiety   . ARF (acute renal failure) ([place]) 05/03/2014  . Arthritis    LEFT HIP  . Asthma    HOSPITALIZED WITH EXCERBATION OF ASTHMA - AND BRONCHITIS AND THE FLU DEC 2013  . Asthma exacerbation attacks 12/28/2011  . Bronchitis 12/28/2011  . CAP (community acquired pneumonia) 07/25/2012  . Chronic kidney disease (CKD), stage IV (severe) ([place]) 11/10/2014  . Class 3 obesity due to excess calories with serious comorbidity and body mass index (BMI) of 40.0 to 44.9 in adult [facility])   . Colles' fracture of left radius   . Depression   . Diabetes mellitus    ON INSULIN AND ORAL MEDICATIONS  . Diabetes mellitus type 2 with complications, uncontrolled ([place]) 04/19/2008   Qualifier: Diagnosis of  By: Tomma Lightning MD, Claiborne Billings     . Diabetic foot infection ([place]) 11/09/2014  . Diabetic ketoacidosis without  coma associated with type 2 diabetes mellitus ([place])   . Diabetic neuropathy ([place]) 11/10/2014  . Diarrhea 08/30/2008   Qualifier: Diagnosis of  By: Tomma Lightning MD, Claiborne Billings    . Diastolic dysfunction 46/09/6293  . Difficult intravenous access   . DKA, type 2 ([place]) 04/04/2016  . Dyslipidemia 12/23/2008   Qualifier: Diagnosis of  By: Tomma Lightning MD, Claiborne Billings    . Essential hypertension 04/19/2008   Qualifier: Diagnosis of  By: Tomma Lightning MD, Claiborne Billings    . FATIGUE 07/12/2008   Qualifier: Diagnosis of  By: Tomma Lightning MD, Claiborne Billings    . Femoral neck fracture ([place]) 07/16/2011  . Femur fracture, left ([place]) 07/08/2014  . Fever   . Fracture of distal femur ([place]) 07/08/2014  . GERD (gastroesophageal reflux disease)   . Hereditary and idiopathic peripheral neuropathy 04/19/2008   Qualifier: Diagnosis of  By: Tomma Lightning MD, Claiborne Billings    . Hip fracture, left ([place]) 07/16/2011  . Hip pain   . HIV infection ([place]) 2000  . Human immunodeficiency virus (HIV) disease ([place]) 04/19/2008   [id number] +   . Hyperlipidemia   . Hypertension   . Hyponatremia 12/29/2011  . Influenza A 12/29/2011  . Influenza B   . Insomnia 06/12/2016  . Left hip pain 05/03/2014  . Mood disorder ([place]) 04/19/2008   Qualifier: Diagnosis of  By: Tomma Lightning MD, Claiborne Billings    . Neuropathy    NEUROPATHY HANDS AND FEET  . NSTEMI (non-ST elevated myocardial infarction) ([place]) 05/19/2016  . Obesity hypoventilation syndrome ([place])   .  Osteoporosis 07/08/2014  . Pain    SEVERE PAIN LEFT HIP - HX OF LEFT HIP PINNING JULY 2013  . Perimenopausal symptoms 02/13/2012   LMP around 2011. On estrace and provera since around 2012 for hot flashes, mood swings. Estrace 2 mg daily, Provera 2.5 mg for 5 days each month.   . Repeated falls 07/30/2011  . Sepsis ([place]) 04/04/2016  . Shortness of breath    ALLERGIES ARE "ACTING UP"  . SOB (shortness of breath)   . THRUSH 05/20/2008   Qualifier: Diagnosis of  By: Tomma Lightning MD, Claiborne Billings      . Tobacco use disorder 04/14/2010    Past Surgical History:  Procedure  Laterality Date  . AMPUTATION Left 11/18/2014   Procedure: LEFT BELOW KNEE AMPUTATION;  Surgeon: Leandrew Koyanagi, MD;  Location: [facility];  Service: Orthopedics;  Laterality: Left;  . CAST APPLICATION Left 04/07/6544   Procedure: CAST APPLICATION (FIBERGLASS);  Surgeon: Latanya Maudlin, MD;  Location: [facility];  Service: Orthopedics;  Laterality: Left;  CLOSED REDUCTION OF LEFT COLLES FRACTURE WITH SHORT ARM CAST  . HARDWARE REMOVAL Left 05/05/2014   Procedure: HARDWARE REMOVAL LEFT HIP;  Surgeon: Latanya Maudlin, MD;  Location: [facility];  Service: Orthopedics;  Laterality: Left;  REMOVAL BIOMET 6.5-8.0 CANNULATED SCREW  . HIP ARTHROPLASTY Left 05/05/2014   Procedure: ARTHROPLASTY OPEN REDUCTION INTERNAL FIXATION LEFT HIP AND REMOVAL OF TWO CANNULATED SCREW;  Surgeon: Latanya Maudlin, MD;  Location: [facility];  Service: Orthopedics;  Laterality: Left;  . HIP PINNING,CANNULATED  07/16/2011   Procedure: CANNULATED HIP PINNING;  Surgeon: Gearlean Alf, MD;  Location: [facility];  Service: Orthopedics;  Laterality: Left;  . HIP PINNING,CANNULATED Left 04/09/2012   Procedure: CANNULATED HIP PINNING AND HARDWARE REVISION;  Surgeon: Gearlean Alf, MD;  Location: [facility];  Service: Orthopedics;  Laterality: Left;  . I&D EXTREMITY Left 11/16/2014   Procedure:  DEBRIDEMENT OF LEFT FOOT POSSIBLE APPLICATION OF INTEGRIA AND VAC ;  Surgeon: Irene Limbo, MD;  Location: [facility];  Service: Plastics;  Laterality: Left;  . PERIPHERAL VASCULAR CATHETERIZATION N/A 11/15/2014   Procedure: Abdominal Aortogram;  Surgeon: Conrad Weldon, MD;  Location: [facility];  Service: Cardiovascular;  Laterality: N/A;  . PERIPHERAL VASCULAR CATHETERIZATION  11/15/2014   Procedure: Lower Extremity Angiography;  Surgeon: Conrad [place], MD;  Location: [facility];  Service: Cardiovascular;;  . PERIPHERAL VASCULAR CATHETERIZATION Left 11/15/2014   Procedure: Peripheral Vascular Intervention;  Surgeon: Conrad , MD;  Location: [facility];  Service: Cardiovascular;  Laterality: Left;  popliteal artery stenting     reports that she quit smoking about 4 weeks ago. Her smoking use included E-cigarettes and Cigarettes. She has a 17.50 pack-year smoking history. She has never used smokeless tobacco. She reports that she does not drink alcohol or use drugs.  Allergies  Allergen Reactions  . Cortizone-10 [Hydrocortisone] Rash    Per patient, given local injection at knee and developed rash at local site.     Family History  Problem Relation Age of Onset  . Diabetes Mother   . Hypertension Mother   . Vision loss Mother   . Heart disease Mother   . Hypertension Father   . Thyroid disease Neg Hx     Prior to Admission medications   Medication Sig Start Date End Date Taking? Authorizing Provider  ARIPiprazole (ABILIFY) 10 MG tablet Take 10 mg by mouth daily. 08/16/16   [provider]  aspirin EC  81 MG EC tablet Take 1 tablet (81 mg total) by mouth daily. Patient taking differently: Take 81 mg by mouth.  05/25/16   Rai, Ripudeep K, MD  Blood Glucose Monitoring Suppl (ACCU-CHEK AVIVA PLUS) w/Device KIT USE TO TEST BLOOD SUGAR DAILY 07/03/16   Elayne Snare, MD  budesonide (PULMICORT) 0.5 MG/2ML nebulizer solution Take 2 mLs by nebulization daily. 08/03/16   [provider]  budesonide-formoterol (SYMBICORT) 80-4.5 MCG/ACT inhaler Inhale 2 puffs into the lungs 2 (two) times daily.    [provider]  Cholecalciferol (VITAMIN D3) 5000 UNITS TABS Take 5,000 Units by mouth daily.     [provider]  clonazePAM (KLONOPIN) 0.5 MG tablet Take 0.5 mg by mouth 3 (three) times daily as needed for anxiety.  08/16/16   [provider]  diphenoxylate-atropine (LOMOTIL) 2.5-0.025 MG tablet Take 1 tablet by mouth 4 (four) times daily as needed for diarrhea or loose stools. 06/12/16   Campbell Riches, MD  DULoxetine (CYMBALTA) 60 MG capsule Take 60 mg by mouth daily. 08/16/16   [provider]    Eszopiclone 3 MG TABS Take 1 tablet (3 mg total) by mouth at bedtime as needed. Take immediately before bedtime Patient taking differently: Take 3 mg by mouth at bedtime as needed (sleep). Take immediately before bedtime  06/12/16   Campbell Riches, MD  fenofibrate (TRICOR) 145 MG tablet Take 145 mg by mouth daily.    [provider]  furosemide (LASIX) 80 MG tablet Take 80 mg by mouth daily.    [provider]  GENVOYA 150-150-200-10 MG TABS tablet TAKE 1 TABLET BY MOUTH EVERY DAY 12/19/15   Campbell Riches, MD  glucose blood (ACCU-CHEK AVIVA) test strip Use to test blood sugar 3 times daily 07/03/16   Elayne Snare, MD  HYDROcodone-acetaminophen (NORCO) 10-325 MG tablet Take 1 tablet by mouth every 8 (eight) hours as needed (pain).    [provider]  Insulin Glargine (TOUJEO SOLOSTAR) 300 UNIT/ML SOPN Inject 70 Units into the skin 2 (two) times daily. 08/21/16   Geradine Girt, DO  insulin regular human CONCENTRATED (HUMULIN R U-500 KWIKPEN) 500 UNIT/ML kwikpen Inject 35-60 Units into the skin See admin instructions. Inject 60 units subcutaneously with breakfast, 50 units with lunch and 35 units with supper    [provider]  ipratropium-albuterol (DUONEB) 0.5-2.5 (3) MG/3ML SOLN Take 3 mLs by nebulization 3 (three) times daily. Patient taking differently: Take 3 mLs by nebulization every 6 (six) hours as needed (shortness of breath/wheezing).  05/25/16   Rai, Ripudeep K, MD  liraglutide (VICTOZA) 18 MG/3ML SOPN Inject 1.8 mg into the skin daily.    [provider]  magnesium oxide (MAG-OX) 400 MG tablet Take 400 mg by mouth daily.    [provider]  pantoprazole (PROTONIX) 40 MG tablet Take 40 mg by mouth daily. 09/02/14   [provider]  potassium chloride SA (K-DUR,KLOR-CON) 20 MEQ tablet Take 1 tablet (20 mEq total) by mouth daily. X 3 days, then check potassium levels if needed to continue Patient taking differently: Take 20 mEq  by mouth daily.  05/25/16   Rai, Vernelle Emerald, MD  predniSONE (DELTASONE) 10 MG tablet 50 mg PO x 1 day, then 2m x1 then 30 mg x 1 day, then 20 mg x1 then 183mx 1 day and stop 08/21/16   VaEulogio Bear, DO  pregabalin (LYRICA) 225 MG capsule Take 225 mg by mouth 2 (two) times daily.  [provider]  rosuvastatin (CRESTOR) 20 MG tablet Take 20 mg by mouth daily. Reported on 05/02/2015 09/02/14   [provider]  temazepam (RESTORIL) 30 MG capsule Take 30 mg by mouth at bedtime. 08/16/16   [provider]  tiZANidine (ZANAFLEX) 4 MG tablet Take 4 mg by mouth daily.  03/06/16   [provider]    Physical Exam: Vitals:   09/03/16 2045 09/03/16 2115 09/03/16 2200 09/03/16 2215  BP: 127/67 127/77 (!) 145/88 133/70  Pulse: (!) 111 (!) 117 (!) 115 (!) 106  Resp: 17 18 (!) 30 (!) 29  Temp:      TempSrc:      SpO2: 92% 93% (!) 89% 93%  Weight:      Height:          Constitutional: Moderately built and nourished. Vitals:   09/03/16 2045 09/03/16 2115 09/03/16 2200 09/03/16 2215  BP: 127/67 127/77 (!) 145/88 133/70  Pulse: (!) 111 (!) 117 (!) 115 (!) 106  Resp: 17 18 (!) 30 (!) 29  Temp:      TempSrc:      SpO2: 92% 93% (!) 89% 93%  Weight:      Height:       Eyes: Anicteric no pallor. ENMT: No discharge from the ears eyes nose and mouth. Neck: No neck rigidity no mass felt. Respiratory: No rhonchi or crepitations. Cardiovascular: S1-S2 heard tachycardic. Abdomen: Soft nontender bowel sounds present. Musculoskeletal: No edema. No joint effusion. Skin: No rash. Skin appears warm. Neurologic: Mildly lethargic but follows commands and oriented to name and place and time. Moves all extremities. Psychiatric: Mildly lethargic.   Labs on Admission: I have personally reviewed following labs and imaging studies  CBC:  Recent Labs Lab 09/03/16 2010  WBC 17.9*  NEUTROABS 14.5*  HGB 12.3  HCT 41.8  MCV 90.7  PLT 419   Basic Metabolic  Panel:  Recent Labs Lab 09/03/16 2010  NA 139  K 4.8  CL 105  CO2 26  GLUCOSE 255*  BUN 33*  CREATININE 1.44*  CALCIUM 8.7*   GFR: Estimated Creatinine Clearance: 57.8 mL/min (A) (by C-G formula based on SCr of 1.44 mg/dL (H)). Liver Function Tests:  Recent Labs Lab 09/03/16 2010  AST 26  ALT 24  ALKPHOS 69  BILITOT 0.8  PROT 6.1*  ALBUMIN 3.3*   No results for input(s): LIPASE, AMYLASE in the last 168 hours.  Recent Labs Lab 09/03/16 2120  AMMONIA 48*   Coagulation Profile: No results for input(s): INR, PROTIME in the last 168 hours. Cardiac Enzymes: No results for input(s): CKTOTAL, CKMB, CKMBINDEX, TROPONINI in the last 168 hours. BNP (last 3 results) No results for input(s): PROBNP in the last 8760 hours. HbA1C: No results for input(s): HGBA1C in the last 72 hours. CBG:  Recent Labs Lab 09/03/16 2003  GLUCAP 250*   Lipid Profile: No results for input(s): CHOL, HDL, LDLCALC, TRIG, CHOLHDL, LDLDIRECT in the last 72 hours. Thyroid Function Tests: No results for input(s): TSH, T4TOTAL, FREET4, T3FREE, THYROIDAB in the last 72 hours. Anemia Panel: No results for input(s): VITAMINB12, FOLATE, FERRITIN, TIBC, IRON, RETICCTPCT in the last 72 hours. Urine analysis:    Component Value Date/Time   COLORURINE YELLOW 09/03/2016 2038   APPEARANCEUR HAZY (A) 09/03/2016 2038   LABSPEC 1.023 09/03/2016 2038   PHURINE 5.0 09/03/2016 2038   GLUCOSEU 50 (A) 09/03/2016 2038   GLUCOSEU 250 (A) 03/16/2015 1612   HGBUR NEGATIVE 09/03/2016 2038   BILIRUBINUR NEGATIVE  09/03/2016 2038   KETONESUR 5 (A) 09/03/2016 2038   PROTEINUR 30 (A) 09/03/2016 2038   UROBILINOGEN 0.2 03/16/2015 1612   NITRITE NEGATIVE 09/03/2016 2038   LEUKOCYTESUR NEGATIVE 09/03/2016 2038   Sepsis Labs: _0 (procalcitonin:4,lacticidven:4) )No results found for this or any previous visit (from the past 240 hour(s)).   Radiological Exams on Admission: Ct Head Wo Contrast  Result Date:  09/03/2016 CLINICAL DATA:  51 year old female with altered level of consciousness and generalized weakness. EXAM: CT HEAD WITHOUT CONTRAST TECHNIQUE: Contiguous axial images were obtained from the base of the skull through the vertex without intravenous contrast. COMPARISON:  08/19/2016 and 05/20/2016 head CTs FINDINGS: Brain: No evidence of acute infarction, hemorrhage, hydrocephalus, extra-axial collection or mass lesion/mass effect. Mild atrophy and chronic small-vessel white matter ischemic changes again noted. Vascular: Mild intracranial atherosclerotic calcifications. Skull: Normal. Negative for fracture or focal lesion. Sinuses/Orbits: No acute finding. Other: None. IMPRESSION: 1. No evidence of acute intracranial abnormality 2. Mild atrophy and chronic small-vessel white matter ischemic changes. Electronically Signed   By: Margarette Canada M.D.   On: 09/03/2016 21:53   Dg Chest Portable 1 View  Result Date: 09/03/2016 CLINICAL DATA:  Nausea in general weakness. EXAM: PORTABLE CHEST 1 VIEW COMPARISON:  August 19, 2016 FINDINGS: The heart size and mediastinal contours are stable. The heart size is enlarged. Both lungs are clear. Chronic posttraumatic deformities of bilateral ribs are identified. IMPRESSION: No active cardiopulmonary disease. Electronically Signed   By: Abelardo Diesel M.D.   On: 09/03/2016 20:32    EKG: Independently reviewed. Sinus tachycardia.  Assessment/Plan Principal Problem:   Acute encephalopathy Active Problems:   Mood disorder ([name])   Hereditary and idiopathic peripheral neuropathy   Essential hypertension   Chronic kidney disease (CKD), stage IV (severe) ([name])   Obesity hypoventilation syndrome ([place])    1. Acute encephalopathy/lethargy with ABG showing mild hypercarbic respiratory acidosis will keep patient on BiPAP and hold off patient's Klonopin Lyrica Restoril tizanidine hydrocodone and eszopiclone. Will give 1 dose of lactulose and recheck ammonia levels. Recheck ABG  in a.m. 2. Diabetes mellitus type 2 on Lantus. Closely follow CBGs. 3. Mood disorder on Cymbalta and Abilify. Note that we are holding off patient's Klonopin and Restoril due to drowsiness. 4. Hereditary and idiopathic peripheral neuropathy and chronic pain - presently holding off Lyrica and pain medications due to drowsiness. 5. Chronic kidney disease stage III - creatinine appears to be at baseline. 6. Asthma - presently not actively wheezing. 7. HIV - continue home medications.  I reviewed patient's old charts and labs.   DVT prophylaxis: Lovenox. Code Status: Full code.  Family Communication: No family at the bedside.  Disposition Plan: Home.  Consults called: None.  Admission status: Observation.    Rise Patience MD [facility] Pager [phone].  If 7PM-7AM, please contact night-coverage [email or web address] Password [email or web address]  09/03/2016, 11:42 PM

## 2016-09-03 NOTE — ED Notes (Signed)
[name] [phone])- called and wanted update on the PT. He currently lives out of town.

## 2016-09-04 DIAGNOSIS — R531 Weakness: Secondary | ICD-10-CM | POA: Diagnosis not present

## 2016-09-04 DIAGNOSIS — J449 Chronic obstructive pulmonary disease, unspecified: Secondary | ICD-10-CM | POA: Diagnosis not present

## 2016-09-04 DIAGNOSIS — G609 Hereditary and idiopathic neuropathy, unspecified: Secondary | ICD-10-CM | POA: Diagnosis not present

## 2016-09-04 DIAGNOSIS — R404 Transient alteration of awareness: Secondary | ICD-10-CM | POA: Diagnosis not present

## 2016-09-04 DIAGNOSIS — E662 Morbid (severe) obesity with alveolar hypoventilation: Secondary | ICD-10-CM

## 2016-09-04 DIAGNOSIS — F39 Unspecified mood [affective] disorder: Secondary | ICD-10-CM | POA: Diagnosis not present

## 2016-09-04 DIAGNOSIS — N184 Chronic kidney disease, stage 4 (severe): Secondary | ICD-10-CM | POA: Diagnosis not present

## 2016-09-04 DIAGNOSIS — R4182 Altered mental status, unspecified: Secondary | ICD-10-CM | POA: Diagnosis not present

## 2016-09-04 DIAGNOSIS — I10 Essential (primary) hypertension: Secondary | ICD-10-CM

## 2016-09-04 DIAGNOSIS — G9340 Encephalopathy, unspecified: Secondary | ICD-10-CM | POA: Diagnosis not present

## 2016-09-04 LAB — I-STAT ARTERIAL BLOOD GAS, ED
ACID-BASE EXCESS: 2 mmol/L (ref 0.0–2.0)
BICARBONATE: 29.3 mmol/L — AB (ref 20.0–28.0)
O2 Saturation: 92 %
PH ART: 7.329 — AB (ref 7.350–7.450)
PO2 ART: 69 mmHg — AB (ref 83.0–108.0)
Patient temperature: 98.7
TCO2: 31 mmol/L (ref 22–32)
pCO2 arterial: 55.7 mmHg — ABNORMAL HIGH (ref 32.0–48.0)

## 2016-09-04 LAB — AMMONIA: AMMONIA: 42 umol/L — AB (ref 9–35)

## 2016-09-04 LAB — BASIC METABOLIC PANEL
ANION GAP: 8 (ref 5–15)
Anion gap: 12 (ref 5–15)
BUN: 32 mg/dL — ABNORMAL HIGH (ref 6–20)
BUN: 34 mg/dL — AB (ref 6–20)
CALCIUM: 8.9 mg/dL (ref 8.9–10.3)
CO2: 28 mmol/L (ref 22–32)
CO2: 24 mmol/L (ref 22–32)
CREATININE: 1.37 mg/dL — AB (ref 0.44–1.00)
Calcium: 9.2 mg/dL (ref 8.9–10.3)
Chloride: 101 mmol/L (ref 101–111)
Chloride: 101 mmol/L (ref 101–111)
Creatinine, Ser: 1.39 mg/dL — ABNORMAL HIGH (ref 0.44–1.00)
GFR, EST AFRICAN AMERICAN: 50 mL/min — AB (ref >60)
GFR, EST AFRICAN AMERICAN: 50 mL/min — AB (ref >60)
GFR, EST NON AFRICAN AMERICAN: 43 mL/min — AB (ref >60)
GFR, EST NON AFRICAN AMERICAN: 43 mL/min — AB (ref >60)
GLUCOSE: 323 mg/dL — AB (ref 65–99)
Glucose, Bld: 422 mg/dL — ABNORMAL HIGH (ref 65–99)
POTASSIUM: 5.9 mmol/L — AB (ref 3.5–5.1)
Potassium: 4.4 mmol/L (ref 3.5–5.1)
SODIUM: 137 mmol/L (ref 135–145)
SODIUM: 137 mmol/L (ref 135–145)

## 2016-09-04 LAB — CBC
HEMATOCRIT: 41.3 % (ref 36.0–46.0)
HEMOGLOBIN: 12.4 g/dL (ref 12.0–15.0)
MCH: 27.2 pg (ref 26.0–34.0)
MCHC: 30 g/dL (ref 30.0–36.0)
MCV: 90.6 fL (ref 78.0–100.0)
Platelets: 257 10*3/uL (ref 150–400)
RBC: 4.56 MIL/uL (ref 3.87–5.11)
RDW: 21 % — ABNORMAL HIGH (ref 11.5–15.5)
WBC: 15.5 10*3/uL — AB (ref 4.0–10.5)

## 2016-09-04 LAB — CBG MONITORING, ED
GLUCOSE-CAPILLARY: 413 mg/dL — AB (ref 65–99)
GLUCOSE-CAPILLARY: 420 mg/dL — AB (ref 65–99)

## 2016-09-04 MED ORDER — LACTULOSE 10 GM/15ML PO SOLN
30.0000 g | Freq: Once | ORAL | Status: AC
  Administered 2016-09-04: 30 g via ORAL
  Filled 2016-09-04: qty 45

## 2016-09-04 MED ORDER — INSULIN ASPART 100 UNIT/ML ~~LOC~~ SOLN: 0.0000 [IU] | Freq: Every day | SUBCUTANEOUS | Status: DC

## 2016-09-04 MED ORDER — INSULIN ASPART 100 UNIT/ML ~~LOC~~ SOLN
0.0000 [IU] | Freq: Three times a day (TID) | SUBCUTANEOUS | Status: DC
  Administered 2016-09-04: 15 [IU] via SUBCUTANEOUS
  Filled 2016-09-04: qty 1

## 2016-09-04 MED ORDER — LACTULOSE 10 GM/15ML PO SOLN: 20.0000 g | Freq: Two times a day (BID) | ORAL | Status: DC

## 2016-09-04 MED ORDER — INSULIN ASPART 100 UNIT/ML ~~LOC~~ SOLN
7.0000 [IU] | Freq: Once | SUBCUTANEOUS | Status: AC
  Administered 2016-09-04: 7 [IU] via SUBCUTANEOUS
  Filled 2016-09-04: qty 1

## 2016-09-04 NOTE — ED Notes (Signed)
MD paged regarding CBG >[phone]

## 2016-09-04 NOTE — ED Notes (Addendum)
Assisted pt using bedside commode. Pt u able to urinate. purewick applied.

## 2016-09-04 NOTE — Progress Notes (Signed)
Pt taken off Bipap by Dr. Alfredia Ferguson.  ABG requested in 30 minutes.  RT will continue to follow.

## 2016-09-04 NOTE — ED Notes (Addendum)
Pt refusing to keep nasal cannula on nose.

## 2016-09-04 NOTE — Progress Notes (Signed)
Patient stated that she is leaving AMA. Spoke with RN who spoke to MD, and no ABG to be obtained at this time.

## 2016-09-04 NOTE — Discharge Summary (Signed)
Physician Discharge Summary  [name]:[id number] DOB: 06-01-1965 DOA: 09/03/2016  PCP: Michel Harrow, PA-C  Admit date: 09/03/2016 Discharge date: 09/04/2016  Admitted From: Home Disposition:  LEFT AGAINST MEDICAL ADVICE  Recommendations for Outpatient Follow-up:  1. Follow up with PCP in 1-2 weeks 2. Please obtain CMP/CBC, Mag, Phos in one week  Home Health: No  Equipment/Devices: None  Discharge Condition: Guarded  CODE STATUS: FULL CODE Diet recommendation: Heart Healthy Carb Modified Diet  Brief/Interim Summary: [name] is a 51 y.o. female with history of asthma, diabetes mellitus type 2, chronic kidney disease, HIV and other comorbids was brought to the ER after patient was getting increasingly lethargic. Patient states over the last 2 days patient was feeling weak and lethargic and called EMS. Denies any new medications recently. Was recently admitted for asthma and encephalopathy 2 weeks ago and patient signed out [place]. In the ER patient was found to be lethargic but oriented to her name and follows commands. ABG shows mild hypercarbia with respiratory acidosis. Urine does screen is positive for benzos and opiates. Patient takes these medications. Patient was being admitted for further observation and Placed on BiPAP. BiPAP was removed this AM and a follow Up ABG was to be done but patient instead of going to the SDU to be watched left [place]. Patient was of sound mind and risks of decompensation, worsening, and even death were discussed with the patient and patient understood these risks and proceeded to Sign out AMA.  Discharge Diagnoses:  Principal Problem:   Acute encephalopathy Active Problems:   Mood disorder ([name])   Hereditary and idiopathic peripheral neuropathy   Essential hypertension   Chronic kidney disease (CKD), stage IV (severe) ([name])   Obesity hypoventilation syndrome ([name])   Acute Encephalopathy/Lethargy  with ABG showing mild Hypercarbic Respiratory Acidosis  -will keep patient on BiPAP and  -hold off patient's Klonopin Lyrica Restoril tizanidine hydrocodone and eszopiclone.  -Will give 1 dose of lactulose and recheck ammonia levels.  -Recheck ABG was to be done after patient was removed off of BiPAP this AM but patient signed out [place] -Patient Left Against Medical Advice even after discussing the risks of not being fully worked up  Diabetes Mellitus Type 2 with Hyperglycemia -On Lantus. Closely follow CBGs. -Was going to adjust Insulin regimen but patient Signed out AMA  Mood disorder  -On Cymbalta and Abilify.  -Held patient's Klonopin and Restoril due to drowsiness. -Signed out AMA  Hereditary and idiopathic peripheral neuropathy and chronic pain  -Held Lyrica and pain medications due to drowsiness. -Signed out AMA  Chronic kidney disease stage III  -Creatinine appears to be at baseline.  Asthma  -presently not actively wheezing. -Signed out AMA  HIV  -Continue home medications  Hyperammonemia -Given Lactulose this AM as Ammonia  Level was 48 -> 42 -Was to repeat in AM but patient signed out [facility]  Discharge Instructions  Discharge Instructions    Call MD for:  difficulty breathing, headache or visual disturbances    Complete by:  As directed    Call MD for:  extreme fatigue    Complete by:  As directed    Call MD for:  hives    Complete by:  As directed    Call MD for:  persistant dizziness or light-headedness    Complete by:  As directed    Call MD for:  persistant nausea and vomiting    Complete by:  As directed  Call MD for:  redness, tenderness, or signs of infection (pain, swelling, redness, odor or green/yellow discharge around incision site)    Complete by:  As directed    Call MD for:  severe uncontrolled pain    Complete by:  As directed    Call MD for:  temperature >100.4    Complete by:  As directed    Diet - low sodium heart healthy    Complete by:   As directed    Discharge instructions    Complete by:  As directed    Follow up with PCP; Patient was alert and oriented and left AMA   Increase activity slowly    Complete by:  As directed      Allergies as of 09/04/2016      Reactions   Cortizone-10 [hydrocortisone] Rash   Per patient, given local injection at knee and developed rash at local site.       Medication List    TAKE these medications   ACCU-CHEK AVIVA PLUS w/Device Kit USE TO TEST BLOOD SUGAR DAILY   ARIPiprazole 10 MG tablet Commonly known as:  ABILIFY Take 10 mg by mouth daily.   aspirin 81 MG EC tablet Take 1 tablet (81 mg total) by mouth daily. What changed:  when to take this   benzonatate 100 MG capsule Commonly known as:  TESSALON Take 100 mg by mouth 3 (three) times daily as needed for cough.   budesonide 0.5 MG/2ML nebulizer solution Commonly known as:  PULMICORT Take 2 mLs by nebulization daily.   budesonide-formoterol 80-4.5 MCG/ACT inhaler Commonly known as:  SYMBICORT Inhale 2 puffs into the lungs 2 (two) times daily.   clonazePAM 0.5 MG tablet Commonly known as:  KLONOPIN Take 0.5 mg by mouth 3 (three) times daily as needed for anxiety.   diphenoxylate-atropine 2.5-0.025 MG tablet Commonly known as:  LOMOTIL Take 1 tablet by mouth 4 (four) times daily as needed for diarrhea or loose stools.   DULoxetine 60 MG capsule Commonly known as:  CYMBALTA Take 60 mg by mouth daily.   Eszopiclone 3 MG Tabs Take 1 tablet (3 mg total) by mouth at bedtime as needed. Take immediately before bedtime What changed:  reasons to take this  additional instructions   fenofibrate 145 MG tablet Commonly known as:  TRICOR Take 145 mg by mouth daily.   GENVOYA 150-150-200-10 MG Tabs tablet Generic drug:  elvitegravir-cobicistat-emtricitabine-tenofovir TAKE 1 TABLET BY MOUTH EVERY DAY What changed:  Another medication with the same name was added. Make sure you understand how and when to take  each.   GENVOYA 150-150-200-10 MG Tabs tablet Generic drug:  elvitegravir-cobicistat-emtricitabine-tenofovir TAKE 1 TABLET BY MOUTH EVERY DAY What changed:  You were already taking a medication with the same name, and this prescription was added. Make sure you understand how and when to take each.   glucose blood test strip Commonly known as:  ACCU-CHEK AVIVA Use to test blood sugar 3 times daily   HUMULIN R U-500 KWIKPEN 500 UNIT/ML kwikpen Generic drug:  insulin regular human CONCENTRATED Inject 35-60 Units into the skin See admin instructions. Inject 60 units subcutaneously with breakfast, 50 units with lunch and 35 units with supper   HYDROcodone-acetaminophen 10-325 MG tablet Commonly known as:  NORCO Take 1 tablet by mouth every 8 (eight) hours as needed (pain).   Insulin Glargine 300 UNIT/ML Sopn Commonly known as:  TOUJEO SOLOSTAR Inject 70 Units into the skin 2 (two) times daily. What changed:  how  much to take   ipratropium-albuterol 0.5-2.5 (3) MG/3ML Soln Commonly known as:  DUONEB Take 3 mLs by nebulization 3 (three) times daily. What changed:  when to take this  reasons to take this   magnesium oxide 400 MG tablet Commonly known as:  MAG-OX Take 400 mg by mouth daily.   metFORMIN 750 MG 24 hr tablet Commonly known as:  GLUCOPHAGE-XR Take 750 mg by mouth daily with breakfast.   pantoprazole 40 MG tablet Commonly known as:  PROTONIX Take 40 mg by mouth daily.   potassium chloride SA 20 MEQ tablet Commonly known as:  K-DUR,KLOR-CON Take 1 tablet (20 mEq total) by mouth daily. X 3 days, then check potassium levels if needed to continue What changed:  additional instructions   pregabalin 225 MG capsule Commonly known as:  LYRICA Take 225 mg by mouth 2 (two) times daily.   rosuvastatin 20 MG tablet Commonly known as:  CRESTOR Take 20 mg by mouth daily. Reported on 05/02/2015   temazepam 30 MG capsule Commonly known as:  RESTORIL Take 30 mg by mouth at  bedtime.   tiZANidine 4 MG tablet Commonly known as:  ZANAFLEX Take 4 mg by mouth 2 (two) times daily.   VICTOZA 18 MG/3ML Sopn Generic drug:  liraglutide Inject 1.8 mg into the skin daily.   Vitamin D3 5000 units Tabs Take 5,000 Units by mouth daily.            Discharge Care Instructions        Start     Ordered   09/04/16 0000  Increase activity slowly     09/04/16 1249   09/04/16 0000  Diet - low sodium heart healthy     09/04/16 1249   09/04/16 0000  Discharge instructions    Comments:  Follow up with PCP; Patient was alert and oriented and left AMA   09/04/16 1249   09/04/16 0000  Call MD for:  temperature >100.4     09/04/16 1249   09/04/16 0000  Call MD for:  persistant nausea and vomiting     09/04/16 1249   09/04/16 0000  Call MD for:  severe uncontrolled pain     09/04/16 1249   09/04/16 0000  Call MD for:  redness, tenderness, or signs of infection (pain, swelling, redness, odor or green/yellow discharge around incision site)     09/04/16 1249   09/04/16 0000  Call MD for:  difficulty breathing, headache or visual disturbances     09/04/16 1249   09/04/16 0000  Call MD for:  hives     09/04/16 1249   09/04/16 0000  Call MD for:  persistant dizziness or light-headedness     09/04/16 1249   09/04/16 0000  Call MD for:  extreme fatigue     09/04/16 1249      Allergies  Allergen Reactions  . Cortizone-10 [Hydrocortisone] Rash    Per patient, given local injection at knee and developed rash at local site.    Consultations:  None  Procedures/Studies: Dg Chest 2 View  Result Date: 08/19/2016 CLINICAL DATA:  Shortness of breath for several months EXAM: CHEST  2 VIEW COMPARISON:  08/19/2016 FINDINGS: Cardiac shadow is mildly enlarged but stable. The lungs are well aerated bilaterally. Old rib fractures are noted on the right with healing. No infiltrate or sizable effusion is noted. No acute bony abnormality is noted. IMPRESSION: No acute abnormality  noted. Electronically Signed   By: Linus Mako.D.  On: 08/19/2016 18:48   Ct Head Wo Contrast  Result Date: 09/03/2016 CLINICAL DATA:  51 year old female with altered level of consciousness and generalized weakness. EXAM: CT HEAD WITHOUT CONTRAST TECHNIQUE: Contiguous axial images were obtained from the base of the skull through the vertex without intravenous contrast. COMPARISON:  08/19/2016 and 05/20/2016 head CTs FINDINGS: Brain: No evidence of acute infarction, hemorrhage, hydrocephalus, extra-axial collection or mass lesion/mass effect. Mild atrophy and chronic small-vessel white matter ischemic changes again noted. Vascular: Mild intracranial atherosclerotic calcifications. Skull: Normal. Negative for fracture or focal lesion. Sinuses/Orbits: No acute finding. Other: None. IMPRESSION: 1. No evidence of acute intracranial abnormality 2. Mild atrophy and chronic small-vessel white matter ischemic changes. Electronically Signed   By: Margarette Canada M.D.   On: 09/03/2016 21:53   Ct Head Wo Contrast  Result Date: 08/19/2016 CLINICAL DATA:  Altered mental status with disorientation EXAM: CT HEAD WITHOUT CONTRAST TECHNIQUE: Contiguous axial images were obtained from the base of the skull through the vertex without intravenous contrast. COMPARISON:  May 20, 2016 FINDINGS: Brain: There is mild atrophy for age. There is no intracranial mass, hemorrhage, extra-axial fluid collection, or midline shift. There is slight small vessel disease in the centra semiovale bilaterally, stable. Elsewhere gray-white compartments are normal. No evident acute infarct. Vascular: There is no appreciable hyperdense vessel. There are foci of calcification in each carotid siphon region. Skull: The bony calvarium appears intact. There is a tiny metallic foreign body in thescalp at the vertex level anteriorly. Sinuses/Orbits: There is mild mucosal thickening in the inferior right maxillary antrum. There is mild mucosal thickening in  several ethmoid air cells bilaterally. Other paranasal sinuses are clear. Orbits appear symmetric bilaterally. Other: Mastoid air cells are clear. IMPRESSION: Mild atrophy for age. Slight periventricular small vessel disease. No intracranial mass, hemorrhage, or extra-axial fluid collection. No evident acute infarct. Foci of arterial vascular calcification noted. Mild paranasal sinus disease. Tiny metallic foreign body in the scalp at the anterior vertex. Electronically Signed   By: Lowella Grip III M.D.   On: 08/19/2016 20:50   US Renal  Result Date: 08/20/2016 CLINICAL DATA:  Renal insufficiency and altered mental status. EXAM: RENAL / URINARY TRACT ULTRASOUND COMPLETE COMPARISON:  05/03/2014 CT FINDINGS: Right Kidney: Length: 11.1 cm. Echogenicity within normal limits. No mass or hydronephrosis visualized. Left Kidney: Length: 10.9 cm. Echogenicity within normal limits. No mass or hydronephrosis visualized. Bladder: Appears normal for degree of bladder distention. IMPRESSION: Maintained cortical-medullary distinction involving both kidneys without obstructive uropathy. No sonographic findings to explain the patient's renal insufficiency. Electronically Signed   By: Ashley Royalty M.D.   On: 08/20/2016 01:33   Dg Chest Portable 1 View  Result Date: 09/03/2016 CLINICAL DATA:  Nausea in general weakness. EXAM: PORTABLE CHEST 1 VIEW COMPARISON:  August 19, 2016 FINDINGS: The heart size and mediastinal contours are stable. The heart size is enlarged. Both lungs are clear. Chronic posttraumatic deformities of bilateral ribs are identified. IMPRESSION: No active cardiopulmonary disease. Electronically Signed   By: Abelardo Diesel M.D.   On: 09/03/2016 20:32   Dg Chest Port 1 View  Result Date: 08/19/2016 CLINICAL DATA:  Shortness of breath EXAM: PORTABLE CHEST 1 VIEW COMPARISON:  06/18/2016 FINDINGS: Low lung volumes. No focal consolidation or effusion. Borderline cardiomegaly. No pneumothorax. IMPRESSION:  Low lung volumes with borderline cardiomegaly. No edema or infiltrate. Electronically Signed   By: Donavan Foil M.D.   On: 08/19/2016 01:20     Subjective: Went down to see the patient  and removed her off of BiPAP. Instructed RT to get an ABG in 30 minutes. Patient awake and alert and oriented. Knew why she came to the hospital. Nursing obtained bed and patient about to be transported to SDU but patient decided to leave AMA. Risks of leaving AMA were discussed with the patient and being of sound mind the patient Signed AMA paperwork and left.   Discharge Exam: Vitals:   09/04/16 1100 09/04/16 1138  BP: 127/75 127/75  Pulse: 81 89  Resp: 16 17  Temp:    SpO2: 92% 94%   Vitals:   09/04/16 1020 09/04/16 1038 09/04/16 1100 09/04/16 1138  BP: 130/88  127/75 127/75  Pulse:  88 81 89  Resp:  14 16 17   Temp:      TempSrc:      SpO2:  94% 92% 94%  Weight:      Height:       General: Pt is alert and oriented x3, awake, NAD Cardiovascular: Tachycardic Rate but regular rhythm, S1/S2 +, no rubs, no gallops Respiratory: Diminished bilaterally, no wheezing, no rhonchi Abdominal: Soft, NT, ND, bowel sounds + Extremities: no edema, no cyanosis; Left Leg BKA  The results of significant diagnostics from this hospitalization (including imaging, microbiology, ancillary and laboratory) are listed below for reference.    Microbiology: No results found for this or any previous visit (from the past 240 hour(s)).   Labs: BNP (last 3 results)  Recent Labs  05/23/16 1205 06/18/16 1636 08/19/16 0827  BNP 119.1* 592.7* 97.5   Basic Metabolic Panel:  Recent Labs Lab 09/03/16 2010 09/04/16 0247 09/04/16 1011  NA 139 137 137  K 4.8 5.9* 4.4  CL 105 101 101  CO2 26 28 24   GLUCOSE 255* 323* 422*  BUN 33* 32* 34*  CREATININE 1.44* 1.39* 1.37*  CALCIUM 8.7* 8.9 9.2   Liver Function Tests:  Recent Labs Lab 09/03/16 2010  AST 26  ALT 24  ALKPHOS 69  BILITOT 0.8  PROT 6.1*  ALBUMIN  3.3*   No results for input(s): LIPASE, AMYLASE in the last 168 hours.  Recent Labs Lab 09/03/16 2120 09/04/16 0247  AMMONIA 48* 42*   CBC:  Recent Labs Lab 09/03/16 2010 09/04/16 0247  WBC 17.9* 15.5*  NEUTROABS 14.5*  --   HGB 12.3 12.4  HCT 41.8 41.3  MCV 90.7 90.6  PLT 299 257   Cardiac Enzymes: No results for input(s): CKTOTAL, CKMB, CKMBINDEX, TROPONINI in the last 168 hours. BNP: Invalid input(s): POCBNP CBG:  Recent Labs Lab 09/03/16 2003 09/04/16 0747 09/04/16 0900  GLUCAP 250* 413* 420*   D-Dimer No results for input(s): DDIMER in the last 72 hours. Hgb A1c No results for input(s): HGBA1C in the last 72 hours. Lipid Profile No results for input(s): CHOL, HDL, LDLCALC, TRIG, CHOLHDL, LDLDIRECT in the last 72 hours. Thyroid function studies No results for input(s): TSH, T4TOTAL, T3FREE, THYROIDAB in the last 72 hours.  Invalid input(s): FREET3 Anemia work up No results for input(s): VITAMINB12, FOLATE, FERRITIN, TIBC, IRON, RETICCTPCT in the last 72 hours. Urinalysis    Component Value Date/Time   COLORURINE YELLOW 09/03/2016 2038   APPEARANCEUR HAZY (A) 09/03/2016 2038   LABSPEC 1.023 09/03/2016 2038   PHURINE 5.0 09/03/2016 2038   GLUCOSEU 50 (A) 09/03/2016 2038   GLUCOSEU 250 (A) 03/16/2015 1612   HGBUR NEGATIVE 09/03/2016 2038   BILIRUBINUR NEGATIVE 09/03/2016 2038   KETONESUR 5 (A) 09/03/2016 2038   PROTEINUR 30 (A) 09/03/2016 2038  UROBILINOGEN 0.2 03/16/2015 1612   NITRITE NEGATIVE 09/03/2016 2038   LEUKOCYTESUR NEGATIVE 09/03/2016 2038   Sepsis Labs Invalid input(s): PROCALCITONIN,  WBC,  LACTICIDVEN Microbiology No results found for this or any previous visit (from the past 240 hour(s)).  Time coordinating discharge: 25 minutes  SIGNED:  Kerney Elbe, DO [facility] 09/04/2016, 12:49 PM Pager [phone]  If 7PM-7AM, please contact night-coverage [email or web address] Password [email or web address]

## 2016-09-04 NOTE — Progress Notes (Addendum)
Inpatient Diabetes Program Recommendations  AACE/ADA: New Consensus Statement on Inpatient Glycemic Control (2015)  Target Ranges:  Prepandial:   less than 140 mg/dL      Peak postprandial:   less than 180 mg/dL (1-2 hours)      Critically ill patients:  140 - 180 mg/dL   Review of Glycemic Control  Diabetes history: DM 2, Sees Dr. Dwyane Dee Endocrinology Outpatient Diabetes medications: Toujeo 70 units BID, Humulin R U-500 60 units breakfast, 50 units Lunch, 35 units supper, Victoza 1.8 Daily Current orders for Inpatient glycemic control: Lantus 65 units BID, Novolog Moderate 0-15 units tid + Novolog HS scale, Victoza 1.8 Daily  Inpatient Diabetes Program Recommendations:    Glucose currently in the 400's. Based on Dr. Ronnie Derby note on 8/24, if glucose remains elevated, consider reordering patient's Humulin R U-500 dose tid on a reduced dose of 25 units tid due to lethargy and possible poor PO intake, while inpatient to control glucose levels. Will follow trends.  Thanks,  Tama Headings RN, MSN, [facility] Inpatient Diabetes Coordinator Team Pager [phone] (8a-5p)

## 2016-09-04 NOTE — ED Notes (Signed)
Patient refusing to go to inpatient room.  States she wants to sign out AMA.

## 2016-09-04 NOTE — ED Notes (Signed)
Attempted to administer Lactulose. Pt states that she "doesnt want that" instructed that we will try again. Pt also c/o "stomach pains" unable to describe where exactly.

## 2016-09-04 NOTE — Progress Notes (Signed)
RT and RN discussed existing Bipap order.  Pt is lethargic.  Pt will be placed on Bipap at this time. RN is notifying the MD.

## 2016-09-05 DIAGNOSIS — M1612 Unilateral primary osteoarthritis, left hip: Secondary | ICD-10-CM | POA: Diagnosis not present

## 2016-09-05 DIAGNOSIS — J45901 Unspecified asthma with (acute) exacerbation: Secondary | ICD-10-CM | POA: Diagnosis not present

## 2016-09-05 DIAGNOSIS — E1122 Type 2 diabetes mellitus with diabetic chronic kidney disease: Secondary | ICD-10-CM | POA: Diagnosis not present

## 2016-09-05 DIAGNOSIS — J9612 Chronic respiratory failure with hypercapnia: Secondary | ICD-10-CM | POA: Diagnosis not present

## 2016-09-05 DIAGNOSIS — E1165 Type 2 diabetes mellitus with hyperglycemia: Secondary | ICD-10-CM | POA: Diagnosis not present

## 2016-09-05 DIAGNOSIS — I129 Hypertensive chronic kidney disease with stage 1 through stage 4 chronic kidney disease, or unspecified chronic kidney disease: Secondary | ICD-10-CM | POA: Diagnosis not present

## 2016-09-08 LAB — CULTURE, BLOOD (ROUTINE X 2)
CULTURE: NO GROWTH
Culture: NO GROWTH
SPECIAL REQUESTS: ADEQUATE
Special Requests: ADEQUATE

## 2016-09-12 ENCOUNTER — Ambulatory Visit: Payer: Medicare HMO | Admitting: Infectious Diseases

## 2016-09-12 DIAGNOSIS — J9612 Chronic respiratory failure with hypercapnia: Secondary | ICD-10-CM | POA: Diagnosis not present

## 2016-09-12 DIAGNOSIS — E1122 Type 2 diabetes mellitus with diabetic chronic kidney disease: Secondary | ICD-10-CM | POA: Diagnosis not present

## 2016-09-12 DIAGNOSIS — I129 Hypertensive chronic kidney disease with stage 1 through stage 4 chronic kidney disease, or unspecified chronic kidney disease: Secondary | ICD-10-CM | POA: Diagnosis not present

## 2016-09-12 DIAGNOSIS — J45901 Unspecified asthma with (acute) exacerbation: Secondary | ICD-10-CM | POA: Diagnosis not present

## 2016-09-12 DIAGNOSIS — E1165 Type 2 diabetes mellitus with hyperglycemia: Secondary | ICD-10-CM | POA: Diagnosis not present

## 2016-09-12 DIAGNOSIS — M1612 Unilateral primary osteoarthritis, left hip: Secondary | ICD-10-CM | POA: Diagnosis not present

## 2016-09-19 ENCOUNTER — Other Ambulatory Visit: Payer: Self-pay | Admitting: Endocrinology

## 2016-09-19 ENCOUNTER — Telehealth: Payer: Self-pay | Admitting: Endocrinology

## 2016-09-19 NOTE — Telephone Encounter (Signed)
This has been ordered 

## 2016-09-19 NOTE — Telephone Encounter (Signed)
MEDICATION: insulin regular human CONCENTRATED (HUMULIN R U-500 KWIKPEN) 500 UNIT/ML kwikpen  PHARMACY:   [facility] [place], [place] - [address] [phone] (Phone) [phone] (Fax)     IS THIS A 90 DAY SUPPLY : Y  IS PATIENT OUT OF MEDICATION: Y  IF NOT; HOW MUCH IS LEFT:   LAST APPOINTMENT DATE: 08/24/16  NEXT APPOINTMENT DATE: 10/08/16  OTHER COMMENTS:    **Let patient know to contact pharmacy at the end of the day to make sure medication is ready. **  ** Please notify patient to allow 48-72 hours to process**  **Encourage patient to contact the pharmacy for refills or they can request refills through [facility]**

## 2016-09-19 NOTE — Telephone Encounter (Signed)
Pharmacy called to check the status of the note below about the refill for the medication. Please refill as soon as possible, patient is out of the medication.

## 2016-09-19 NOTE — Telephone Encounter (Signed)
Routing to you °

## 2016-09-20 ENCOUNTER — Ambulatory Visit: Payer: Medicare HMO | Admitting: Internal Medicine

## 2016-09-20 DIAGNOSIS — Z5181 Encounter for therapeutic drug level monitoring: Secondary | ICD-10-CM | POA: Diagnosis not present

## 2016-09-20 DIAGNOSIS — Z79899 Other long term (current) drug therapy: Secondary | ICD-10-CM | POA: Diagnosis not present

## 2016-09-21 ENCOUNTER — Institutional Professional Consult (permissible substitution): Payer: Medicare HMO | Admitting: Internal Medicine

## 2016-09-21 DIAGNOSIS — J449 Chronic obstructive pulmonary disease, unspecified: Secondary | ICD-10-CM | POA: Diagnosis not present

## 2016-09-21 DIAGNOSIS — S72002D Fracture of unspecified part of neck of left femur, subsequent encounter for closed fracture with routine healing: Secondary | ICD-10-CM | POA: Diagnosis not present

## 2016-09-21 DIAGNOSIS — J45901 Unspecified asthma with (acute) exacerbation: Secondary | ICD-10-CM | POA: Diagnosis not present

## 2016-09-21 DIAGNOSIS — J45909 Unspecified asthma, uncomplicated: Secondary | ICD-10-CM | POA: Diagnosis not present

## 2016-09-21 DIAGNOSIS — M545 Low back pain: Secondary | ICD-10-CM | POA: Diagnosis not present

## 2016-09-21 DIAGNOSIS — N184 Chronic kidney disease, stage 4 (severe): Secondary | ICD-10-CM | POA: Diagnosis not present

## 2016-09-21 DIAGNOSIS — M6281 Muscle weakness (generalized): Secondary | ICD-10-CM | POA: Diagnosis not present

## 2016-09-28 ENCOUNTER — Emergency Department (HOSPITAL_COMMUNITY)
Admission: EM | Admit: 2016-09-28 | Discharge: 2016-09-28 | Disposition: A | Discharge status: Home / Self Care | Payer: Medicare HMO | Attending: Emergency Medicine | Admitting: Emergency Medicine

## 2016-09-28 ENCOUNTER — Ambulatory Visit: Payer: Medicare HMO | Admitting: Internal Medicine

## 2016-09-28 DIAGNOSIS — Z5321 Procedure and treatment not carried out due to patient leaving prior to being seen by health care provider: Secondary | ICD-10-CM | POA: Insufficient documentation

## 2016-09-28 DIAGNOSIS — R0602 Shortness of breath: Secondary | ICD-10-CM | POA: Insufficient documentation

## 2016-09-28 DIAGNOSIS — M545 Low back pain: Secondary | ICD-10-CM | POA: Diagnosis not present

## 2016-09-28 DIAGNOSIS — Z79899 Other long term (current) drug therapy: Secondary | ICD-10-CM | POA: Diagnosis not present

## 2016-09-28 DIAGNOSIS — G8929 Other chronic pain: Secondary | ICD-10-CM | POA: Diagnosis not present

## 2016-09-28 DIAGNOSIS — B349 Viral infection, unspecified: Secondary | ICD-10-CM | POA: Diagnosis not present

## 2016-09-28 NOTE — ED Notes (Signed)
Patient was told she would be triaged in room 1 and then would have to wait in the lobby. Patient refused to be triaged, called for a ride home. Patient asked Aryanah, EMT to take her out front to wait for her ride.

## 2016-09-28 NOTE — Progress Notes (Deleted)
Cardiology Office Note   Date:  09/28/2016   ID:  [name], DOB June 21, 1965, MRN [id number]  PCP:  Michel Harrow, PA-C  Cardiologist:   Dorris Carnes, MD   F/U of  Diastolic CHF      History of Present Illness: [name] is a 51 y.o. female with a history of resp failure  REcently admitted to [facility]  She had an echo donwe on 5/24  LVEF normal at 45 to 65% with mod LVH  Gr II diastolic dysfunction   She was seen by Cardiology in May 2018  Pt get weak, less responsive  O2 low   Elevated trop due to demand  Also ahistory of L BKA, PVOD, HTN, HIV  DM  HL  GeRED   Since last discharge was sent home on oxygen  Not using it a lot   OCcasional SOB   Denies CP    No outpatient prescriptions have been marked as taking for the 09/28/16 encounter (Appointment) with Fay Records, MD.     Allergies:   Cortizone-10 [hydrocortisone]   Past Medical History:  Diagnosis Date  . Acute metabolic encephalopathy 7/42/5956  . Acute on chronic respiratory failure with hypoxia ([place]) 05/19/2016  . Anxiety   . ARF (acute renal failure) ([place]) 05/03/2014  . Arthritis    LEFT HIP  . Asthma    HOSPITALIZED WITH EXCERBATION OF ASTHMA - AND BRONCHITIS AND THE FLU DEC 2013  . Asthma exacerbation attacks 12/28/2011  . Bronchitis 12/28/2011  . CAP (community acquired pneumonia) 07/25/2012  . Chronic kidney disease (CKD), stage IV (severe) ([place]) 11/10/2014  . Class 3 obesity due to excess calories with serious comorbidity and body mass index (BMI) of 40.0 to 44.9 in adult [facility])   . Colles' fracture of left radius   . Depression   . Diabetes mellitus    ON INSULIN AND ORAL MEDICATIONS  . Diabetes mellitus type 2 with complications, uncontrolled ([place]) 04/19/2008   Qualifier: Diagnosis of  By: Tomma Lightning MD, Claiborne Billings     . Diabetic foot infection ([place]) 11/09/2014  . Diabetic ketoacidosis without coma associated with type 2 diabetes mellitus ([place])   . Diabetic neuropathy ([place]) 11/10/2014  .  Diarrhea 08/30/2008   Qualifier: Diagnosis of  By: Tomma Lightning MD, Claiborne Billings    . Diastolic dysfunction 38/07/5641  . Difficult intravenous access   . DKA, type 2 ([place]) 04/04/2016  . Dyslipidemia 12/23/2008   Qualifier: Diagnosis of  By: Tomma Lightning MD, Claiborne Billings    . Essential hypertension 04/19/2008   Qualifier: Diagnosis of  By: Tomma Lightning MD, Claiborne Billings    . FATIGUE 07/12/2008   Qualifier: Diagnosis of  By: Tomma Lightning MD, Claiborne Billings    . Femoral neck fracture ([place]) 07/16/2011  . Femur fracture, left ([place]) 07/08/2014  . Fever   . Fracture of distal femur ([place]) 07/08/2014  . GERD (gastroesophageal reflux disease)   . Hereditary and idiopathic peripheral neuropathy 04/19/2008   Qualifier: Diagnosis of  By: Tomma Lightning MD, Claiborne Billings    . Hip fracture, left ([place]) 07/16/2011  . Hip pain   . HIV infection ([place]) 2000  . Human immunodeficiency virus (HIV) disease ([place]) 04/19/2008   [id number] +   . Hyperlipidemia   . Hypertension   . Hyponatremia 12/29/2011  . Influenza A 12/29/2011  . Influenza B   . Insomnia 06/12/2016  . Left hip pain 05/03/2014  . Mood disorder ([place]) 04/19/2008   Qualifier: Diagnosis of  By: Tomma Lightning MD, Claiborne Billings    .  Neuropathy    NEUROPATHY HANDS AND FEET  . NSTEMI (non-ST elevated myocardial infarction) ([place]) 05/19/2016  . Obesity hypoventilation syndrome ([place])   . Osteoporosis 07/08/2014  . Pain    SEVERE PAIN LEFT HIP - HX OF LEFT HIP PINNING JULY 2013  . Perimenopausal symptoms 02/13/2012   LMP around 2011. On estrace and provera since around 2012 for hot flashes, mood swings. Estrace 2 mg daily, Provera 2.5 mg for 5 days each month.   . Repeated falls 07/30/2011  . Sepsis ([place]) 04/04/2016  . Shortness of breath    ALLERGIES ARE "ACTING UP"  . SOB (shortness of breath)   . THRUSH 05/20/2008   Qualifier: Diagnosis of  By: Tomma Lightning MD, Claiborne Billings      . Tobacco use disorder 04/14/2010    Past Surgical History:  Procedure Laterality Date  . AMPUTATION Left 11/18/2014   Procedure: LEFT BELOW KNEE AMPUTATION;  Surgeon:  Leandrew Koyanagi, MD;  Location: [facility];  Service: Orthopedics;  Laterality: Left;  . CAST APPLICATION Left 04/04/9673   Procedure: CAST APPLICATION (FIBERGLASS);  Surgeon: Latanya Maudlin, MD;  Location: [facility];  Service: Orthopedics;  Laterality: Left;  CLOSED REDUCTION OF LEFT COLLES FRACTURE WITH SHORT ARM CAST  . HARDWARE REMOVAL Left 05/05/2014   Procedure: HARDWARE REMOVAL LEFT HIP;  Surgeon: Latanya Maudlin, MD;  Location: [facility];  Service: Orthopedics;  Laterality: Left;  REMOVAL BIOMET 6.5-8.0 CANNULATED SCREW  . HIP ARTHROPLASTY Left 05/05/2014   Procedure: ARTHROPLASTY OPEN REDUCTION INTERNAL FIXATION LEFT HIP AND REMOVAL OF TWO CANNULATED SCREW;  Surgeon: Latanya Maudlin, MD;  Location: [facility];  Service: Orthopedics;  Laterality: Left;  . HIP PINNING,CANNULATED  07/16/2011   Procedure: CANNULATED HIP PINNING;  Surgeon: Gearlean Alf, MD;  Location: [facility];  Service: Orthopedics;  Laterality: Left;  . HIP PINNING,CANNULATED Left 04/09/2012   Procedure: CANNULATED HIP PINNING AND HARDWARE REVISION;  Surgeon: Gearlean Alf, MD;  Location: [facility];  Service: Orthopedics;  Laterality: Left;  . I&D EXTREMITY Left 11/16/2014   Procedure:  DEBRIDEMENT OF LEFT FOOT POSSIBLE APPLICATION OF INTEGRIA AND VAC ;  Surgeon: Irene Limbo, MD;  Location: [facility];  Service: Plastics;  Laterality: Left;  . PERIPHERAL VASCULAR CATHETERIZATION N/A 11/15/2014   Procedure: Abdominal Aortogram;  Surgeon: Conrad Storden, MD;  Location: [facility];  Service: Cardiovascular;  Laterality: N/A;  . PERIPHERAL VASCULAR CATHETERIZATION  11/15/2014   Procedure: Lower Extremity Angiography;  Surgeon: Conrad Wyaconda, MD;  Location: [facility];  Service: Cardiovascular;;  . PERIPHERAL VASCULAR CATHETERIZATION Left 11/15/2014   Procedure: Peripheral Vascular Intervention;  Surgeon: Conrad Tappan, MD;  Location: [facility];  Service: Cardiovascular;  Laterality: Left;  popliteal artery stenting     Social  History:  The patient  reports that she quit smoking about 8 weeks ago. Her smoking use included E-cigarettes and Cigarettes. She has a 17.50 pack-year smoking history. She has never used smokeless tobacco. She reports that she does not drink alcohol or use drugs.   Family History:  The patient's family history includes Diabetes in her mother; Heart disease in her mother; Hypertension in her father and mother; Vision loss in her mother.    ROS:  Please see the history of present illness. All other systems are reviewed and  Negative to the above problem except as noted.    PHYSICAL EXAM: VS:  LMP 11/16/2010   GEN: Well nourished, well developed, in no acute distress  HEENT:  normal  Neck: no JVD, carotid bruits, or masses Cardiac: RRR; no murmurs, rubs, or gallops,Tr edema   S/p L BKA Respiratory:  clear to auscultation bilaterally, normal work of breathing GI: soft, nontender, nondistended, + BS  No hepatomegaly  MS: no deformity Moving all extremities   Skin: warm and dry, no rash Neuro:  Strength and sensation are intact Psych: euthymic mood, full affect   EKG:  EKG is not ordered today.   Lipid Panel    Component Value Date/Time   CHOL 139 06/12/2016 1638   TRIG 154 (H) 06/12/2016 1638   HDL 49 (L) 06/12/2016 1638   CHOLHDL 2.8 06/12/2016 1638   VLDL 31 (H) 06/12/2016 1638   LDLCALC 59 06/12/2016 1638      Wt Readings from Last 3 Encounters:  09/03/16 215 lb (97.5 kg)  08/19/16 230 lb (104.3 kg)  06/29/16 236 lb (107 kg)      ASSESSMENT AND PLAN:  1  Chronic diastolic CHF  Volume status does not look bad   We discussed salt intake   Keep on same meds    2  CKD    Cr 1.4 to 1.7  Follow    3  HTN  BP is ok on current regimen  4  HL Continue Crestor       Current medicines are reviewed at length with the patient today.  The patient does not have concerns regarding medicines.  Signed, Dorris Carnes, MD  09/28/2016 3:03 PM    [facility]  [facility], [place], [place]  [address] Phone: [phone]; Fax: [phone]

## 2016-10-03 ENCOUNTER — Telehealth: Payer: Self-pay | Admitting: Endocrinology

## 2016-10-03 NOTE — Telephone Encounter (Signed)
MEDICATION: liraglutide (VICTOZA) 18 MG/3ML SOPN  PHARMACY:   [facility] - [place], [place] [phone] (Phone) [phone] (Fax)     IS THIS A 90 DAY SUPPLY : 30 day  IS PATIENT OUT OF MEDICATION: yes  IF NOT; HOW MUCH IS LEFT:   LAST APPOINTMENT DATE: @9 /19/2018  NEXT APPOINTMENT DATE:@10 /08/2016  OTHER COMMENTS:    **Let patient know to contact pharmacy at the end of the day to make sure medication is ready. **  ** Please notify patient to allow 48-72 hours to process**  **Encourage patient to contact the pharmacy for refills or they can request refills through [facility]**

## 2016-10-08 ENCOUNTER — Ambulatory Visit: Payer: Medicare HMO | Admitting: Endocrinology

## 2016-10-15 ENCOUNTER — Institutional Professional Consult (permissible substitution): Payer: Medicare HMO | Admitting: Internal Medicine

## 2016-10-21 DIAGNOSIS — S72002D Fracture of unspecified part of neck of left femur, subsequent encounter for closed fracture with routine healing: Secondary | ICD-10-CM | POA: Diagnosis not present

## 2016-10-21 DIAGNOSIS — M6281 Muscle weakness (generalized): Secondary | ICD-10-CM | POA: Diagnosis not present

## 2016-10-21 DIAGNOSIS — J449 Chronic obstructive pulmonary disease, unspecified: Secondary | ICD-10-CM | POA: Diagnosis not present

## 2016-10-21 DIAGNOSIS — N184 Chronic kidney disease, stage 4 (severe): Secondary | ICD-10-CM | POA: Diagnosis not present

## 2016-10-21 DIAGNOSIS — M545 Low back pain: Secondary | ICD-10-CM | POA: Diagnosis not present

## 2016-10-21 DIAGNOSIS — J45901 Unspecified asthma with (acute) exacerbation: Secondary | ICD-10-CM | POA: Diagnosis not present

## 2016-10-21 DIAGNOSIS — J45909 Unspecified asthma, uncomplicated: Secondary | ICD-10-CM | POA: Diagnosis not present

## 2016-10-31 ENCOUNTER — Telehealth: Payer: Self-pay | Admitting: Internal Medicine

## 2016-10-31 ENCOUNTER — Institutional Professional Consult (permissible substitution): Payer: Medicare HMO | Admitting: Internal Medicine

## 2016-10-31 NOTE — Telephone Encounter (Signed)
Per Sharl Ma, I advised the patient due to multiple cancellations and our office policy that I would put a message back to triage to contact referring provider, [name], PA at [phone] to see if patient still needs appointment and if approved to reschedule.

## 2016-10-31 NOTE — Telephone Encounter (Signed)
Called and I left a message to make [name], PA  Aware that the pt has cancelled her appt with MW x 5 and do we need to schedule her again to be seen by MW or does Loma Sousa want to see the pt again.  They have sent a message to [facility] to find out and will call us back .

## 2016-11-01 NOTE — Telephone Encounter (Signed)
[name] PA expressed that she did wish to pursue the consult with Dr. Melvyn Novas.  Made an appt for 11/09/16,-tr

## 2016-11-02 ENCOUNTER — Other Ambulatory Visit: Payer: Self-pay | Admitting: Endocrinology

## 2016-11-02 NOTE — Telephone Encounter (Signed)
Pt not on Victoza/denied/thx dmf

## 2016-11-05 DIAGNOSIS — Z79899 Other long term (current) drug therapy: Secondary | ICD-10-CM | POA: Diagnosis not present

## 2016-11-05 DIAGNOSIS — G8929 Other chronic pain: Secondary | ICD-10-CM | POA: Diagnosis not present

## 2016-11-05 DIAGNOSIS — M545 Low back pain: Secondary | ICD-10-CM | POA: Diagnosis not present

## 2016-11-06 ENCOUNTER — Ambulatory Visit: Payer: Medicare HMO | Admitting: Infectious Diseases

## 2016-11-09 ENCOUNTER — Institutional Professional Consult (permissible substitution): Payer: Medicare HMO | Admitting: Internal Medicine

## 2016-11-12 ENCOUNTER — Institutional Professional Consult (permissible substitution): Payer: Medicare HMO | Admitting: Internal Medicine

## 2016-11-13 ENCOUNTER — Institutional Professional Consult (permissible substitution): Payer: Medicare HMO | Admitting: Internal Medicine

## 2016-11-17 ENCOUNTER — Other Ambulatory Visit: Payer: Self-pay | Admitting: Endocrinology

## 2016-11-20 ENCOUNTER — Ambulatory Visit (INDEPENDENT_AMBULATORY_CARE_PROVIDER_SITE_OTHER): Payer: Medicare HMO | Admitting: Infectious Diseases

## 2016-11-20 ENCOUNTER — Encounter: Payer: Self-pay | Admitting: Infectious Diseases

## 2016-11-20 VITALS — BP 117/76 | HR 80 | Temp 98.2°F

## 2016-11-20 DIAGNOSIS — N183 Chronic kidney disease, stage 3 unspecified: Secondary | ICD-10-CM

## 2016-11-20 DIAGNOSIS — IMO0002 Reserved for concepts with insufficient information to code with codable children: Secondary | ICD-10-CM

## 2016-11-20 DIAGNOSIS — G4700 Insomnia, unspecified: Secondary | ICD-10-CM | POA: Diagnosis not present

## 2016-11-20 DIAGNOSIS — B20 Human immunodeficiency virus [HIV] disease: Secondary | ICD-10-CM

## 2016-11-20 DIAGNOSIS — E1165 Type 2 diabetes mellitus with hyperglycemia: Secondary | ICD-10-CM

## 2016-11-20 DIAGNOSIS — E118 Type 2 diabetes mellitus with unspecified complications: Secondary | ICD-10-CM | POA: Diagnosis not present

## 2016-11-20 DIAGNOSIS — Z23 Encounter for immunization: Secondary | ICD-10-CM

## 2016-11-20 MED ORDER — TEMAZEPAM 30 MG PO CAPS: 30.0000 mg | ORAL_CAPSULE | Freq: Every day | ORAL | 3 refills | Status: DC

## 2016-11-20 MED ORDER — DIPHENOXYLATE-ATROPINE 2.5-0.025 MG PO TABS: 1.0000 | ORAL_TABLET | Freq: Four times a day (QID) | ORAL | 3 refills | Status: DC | PRN

## 2016-11-20 NOTE — Assessment & Plan Note (Signed)
Will continue genvoya Will refill lomotoil (she attributes diarrhea to ART) Flu shot today Labs today rtc in 6 months

## 2016-11-20 NOTE — Assessment & Plan Note (Signed)
Her Cr is stable, will continue to watch
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
## 2016-11-20 NOTE — Assessment & Plan Note (Signed)
Will refill her restoril.  F/u with psy

## 2016-11-20 NOTE — Assessment & Plan Note (Signed)
FSG have been [phone] Last A1C was 9% States she is eating healthy Will f/u with endo

## 2016-11-21 DIAGNOSIS — M545 Low back pain: Secondary | ICD-10-CM | POA: Diagnosis not present

## 2016-11-21 DIAGNOSIS — M6281 Muscle weakness (generalized): Secondary | ICD-10-CM | POA: Diagnosis not present

## 2016-11-21 DIAGNOSIS — J45901 Unspecified asthma with (acute) exacerbation: Secondary | ICD-10-CM | POA: Diagnosis not present

## 2016-11-21 DIAGNOSIS — N184 Chronic kidney disease, stage 4 (severe): Secondary | ICD-10-CM | POA: Diagnosis not present

## 2016-11-21 DIAGNOSIS — S72002D Fracture of unspecified part of neck of left femur, subsequent encounter for closed fracture with routine healing: Secondary | ICD-10-CM | POA: Diagnosis not present

## 2016-11-21 DIAGNOSIS — J45909 Unspecified asthma, uncomplicated: Secondary | ICD-10-CM | POA: Diagnosis not present

## 2016-11-21 DIAGNOSIS — J449 Chronic obstructive pulmonary disease, unspecified: Secondary | ICD-10-CM | POA: Diagnosis not present

## 2016-11-21 LAB — COMPREHENSIVE METABOLIC PANEL
AG RATIO: 1.4 (calc) (ref 1.0–2.5)
ALKALINE PHOSPHATASE (APISO): 69 U/L (ref 33–130)
ALT: 5 U/L — AB (ref 6–29)
AST: 8 U/L — AB (ref 10–35)
Albumin: 3.7 g/dL (ref 3.6–5.1)
BILIRUBIN TOTAL: 0.2 mg/dL (ref 0.2–1.2)
BUN/Creatinine Ratio: 21 (calc) (ref 6–22)
BUN: 27 mg/dL — ABNORMAL HIGH (ref 7–25)
CALCIUM: 9.8 mg/dL (ref 8.6–10.4)
CO2: 34 mmol/L — AB (ref 20–32)
Chloride: 99 mmol/L (ref 98–110)
Creat: 1.29 mg/dL — ABNORMAL HIGH (ref 0.50–1.05)
GLUCOSE: 98 mg/dL (ref 65–99)
Globulin: 2.7 g/dL (calc) (ref 1.9–3.7)
Potassium: 4.4 mmol/L (ref 3.5–5.3)
Sodium: 140 mmol/L (ref 135–146)
Total Protein: 6.4 g/dL (ref 6.1–8.1)

## 2016-11-21 LAB — CBC
HEMATOCRIT: 39.5 % (ref 35.0–45.0)
HEMOGLOBIN: 11.9 g/dL (ref 11.7–15.5)
MCH: 24.6 pg — AB (ref 27.0–33.0)
MCHC: 30.1 g/dL — AB (ref 32.0–36.0)
MCV: 81.8 fL (ref 80.0–100.0)
MPV: 12.4 fL (ref 7.5–12.5)
Platelets: 328 10*3/uL (ref 140–400)
RBC: 4.83 10*6/uL (ref 3.80–5.10)
RDW: 16.9 % — ABNORMAL HIGH (ref 11.0–15.0)
WBC: 10.5 10*3/uL (ref 3.8–10.8)

## 2016-11-21 LAB — T-HELPER CELL (CD4) - (RCID CLINIC ONLY)
CD4 T CELL ABS: 490 /uL (ref 400–2700)
CD4 T CELL HELPER: 21 % — AB (ref 33–55)

## 2016-11-23 IMAGING — CR DG CHEST 1V PORT
1 series · 1 of 1 positions shown · non-contrast
Comparison: July 08, 2014

CLINICAL DATA: Shortness of Breath

EXAM:
PORTABLE CHEST 1 VIEW

[AP]
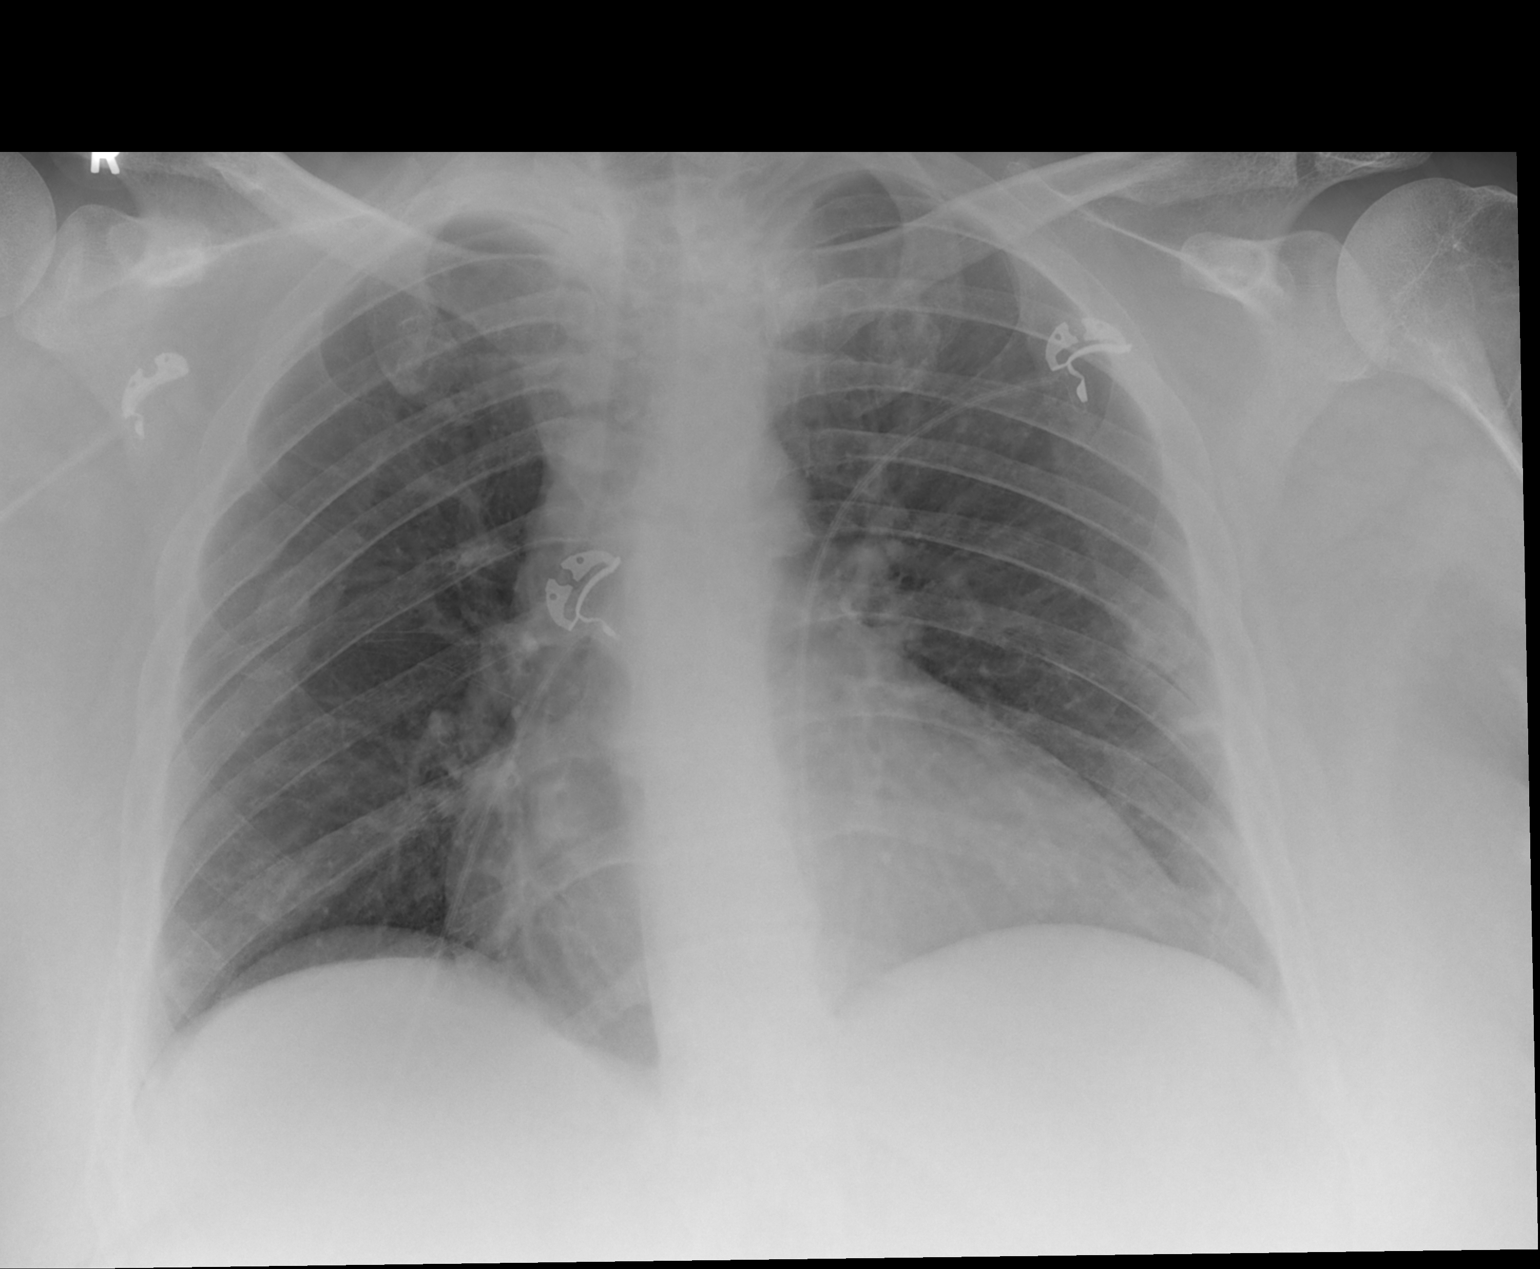

[1 of 1 positions shown; findings below may reference images not displayed]

FINDINGS: There is no edema or consolidation. Heart is mildly enlarged with
pulmonary vascularity within normal limits. No adenopathy. There are
healed rib fractures bilaterally.
IMPRESSION: No edema or consolidation.  Stable cardiac prominence.

## 2016-11-27 ENCOUNTER — Other Ambulatory Visit: Payer: Self-pay | Admitting: Endocrinology

## 2016-11-27 LAB — HIV-1 RNA QUANT-NO REFLEX-BLD
HIV 1 RNA Quant: 28 copies/mL — ABNORMAL HIGH
HIV-1 RNA QUANT, LOG: 1.45 {Log_copies}/mL — AB

## 2016-11-28 ENCOUNTER — Ambulatory Visit (INDEPENDENT_AMBULATORY_CARE_PROVIDER_SITE_OTHER): Payer: Medicare HMO | Admitting: Pulmonary Disease

## 2016-11-28 ENCOUNTER — Encounter: Payer: Self-pay | Admitting: Pulmonary Disease

## 2016-11-28 VITALS — BP 130/80 | HR 113 | Ht 70.0 in | Wt 236.0 lb

## 2016-11-28 DIAGNOSIS — J9611 Chronic respiratory failure with hypoxia: Secondary | ICD-10-CM | POA: Diagnosis not present

## 2016-11-28 DIAGNOSIS — J449 Chronic obstructive pulmonary disease, unspecified: Secondary | ICD-10-CM

## 2016-11-28 DIAGNOSIS — J9612 Chronic respiratory failure with hypercapnia: Secondary | ICD-10-CM

## 2016-11-28 MED ORDER — FLUTICASONE FUROATE-VILANTEROL 100-25 MCG/INH IN AEPB: 1.0000 | INHALATION_SPRAY | Freq: Every day | RESPIRATORY_TRACT | 5 refills | Status: DC

## 2016-11-28 MED ORDER — ALBUTEROL SULFATE HFA 108 (90 BASE) MCG/ACT IN AERS: 2.0000 | INHALATION_SPRAY | Freq: Four times a day (QID) | RESPIRATORY_TRACT | 3 refills | Status: DC | PRN

## 2016-11-28 NOTE — Progress Notes (Signed)
   Subjective:    Patient ID: [name], female    DOB: September 13, 1965, 51 y.o.   MRN: [id number]  HPI    Review of Systems  Constitutional: Negative for fever and unexpected weight change.  HENT: Positive for congestion, dental problem, postnasal drip, sinus pressure and voice change. Negative for ear pain, nosebleeds, rhinorrhea, sneezing, sore throat and trouble swallowing.   Eyes: Positive for pain, redness and itching.  Respiratory: Positive for cough, chest tightness and shortness of breath. Negative for wheezing.   Cardiovascular: Negative for palpitations and leg swelling.  Gastrointestinal: Negative for nausea and vomiting.  Genitourinary: Negative for dysuria.  Musculoskeletal: Negative for joint swelling.  Skin: Negative for rash.  Allergic/Immunologic: Positive for environmental allergies. Negative for food allergies and immunocompromised state.  Neurological: Negative for headaches.  Hematological: Does not bruise/bleed easily.  Psychiatric/Behavioral: Positive for dysphoric mood. The patient is nervous/anxious.        Objective:   Physical Exam        Assessment & Plan:

## 2016-11-28 NOTE — Patient Instructions (Signed)
Will arrange for overnight oxygen test with 2 liters oxygen  Breo one puff daily  Proair two puffs every 6 hours as needed for cough, wheeze, or chest congestion  Follow up in 6 weeks with Dr. Halford Chessman or Nurse Practitioner

## 2016-11-28 NOTE — Progress Notes (Signed)
Past surgical history She  has a past surgical history that includes Hip pinning, cannulated (07/16/2011); Hip pinning, cannulated (Left, 04/09/2012); Hip Arthroplasty (Left, 05/05/2014); Cast application (Left, 04/01/5828); Hardware Removal (Left, 05/05/2014); Cardiac catheterization (N/A, 11/15/2014); Cardiac catheterization (11/15/2014); Cardiac catheterization (Left, 11/15/2014); I&D extremity (Left, 11/16/2014); and Amputation (Left, 11/18/2014).  Family history Her family history includes Diabetes in her mother; Heart disease in her mother; Hypertension in her father and mother; Vision loss in her mother.  Social history She  reports that she has been smoking e-cigarettes and cigarettes.  She has a 17.50 pack-year smoking history. she has never used smokeless tobacco. She reports that she does not drink alcohol or use drugs.  Review of Systems  Constitutional: Negative for fever and unexpected weight change.  HENT: Positive for congestion, dental problem, postnasal drip, sinus pressure and voice change. Negative for ear pain, nosebleeds, rhinorrhea, sneezing, sore throat and trouble swallowing.   Eyes: Positive for pain, redness and itching.  Respiratory: Positive for cough, chest tightness and shortness of breath. Negative for wheezing.   Cardiovascular: Negative for palpitations and leg swelling.  Gastrointestinal: Negative for nausea and vomiting.  Genitourinary: Negative for dysuria.  Musculoskeletal: Negative for joint swelling.  Skin: Negative for rash.  Allergic/Immunologic: Positive for environmental allergies. Negative for food allergies and immunocompromised state.  Neurological: Negative for headaches.  Hematological: Does not bruise/bleed easily.  Psychiatric/Behavioral: Positive for dysphoric mood. The patient is nervous/anxious.    Allergies  Allergen Reactions  . Cortizone-10 [Hydrocortisone] Rash    Per patient, given local injection at knee and developed rash at local site.      Current Outpatient Medications on File Prior to Visit  Medication Sig  . ACCU-CHEK AVIVA PLUS test strip TEST BLOOD SUGAR THREE TIMES DAILY AS DIRECTED  . aspirin EC 81 MG EC tablet Take 1 tablet (81 mg total) by mouth daily. (Patient taking differently: Take 81 mg by mouth. )  . Blood Glucose Monitoring Suppl (ACCU-CHEK AVIVA PLUS) w/Device KIT USE TO TEST BLOOD SUGAR DAILY  . clonazePAM (KLONOPIN) 0.5 MG tablet Take 0.5 mg by mouth 3 (three) times daily as needed for anxiety.   . diphenoxylate-atropine (LOMOTIL) 2.5-0.025 MG tablet Take 1 tablet by mouth 4 (four) times daily as needed for diarrhea or loose stools.  . DULoxetine (CYMBALTA) 60 MG capsule Take 60 mg by mouth daily.  . Eszopiclone 3 MG TABS Take 1 tablet (3 mg total) by mouth at bedtime as needed. Take immediately before bedtime (Patient taking differently: Take 3 mg by mouth at bedtime as needed (sleep). Take immediately before bedtime )  . fenofibrate (TRICOR) 145 MG tablet Take 145 mg by mouth daily.  . GENVOYA 150-150-200-10 MG TABS tablet TAKE 1 TABLET BY MOUTH EVERY DAY  . HUMULIN R U-500 KWIKPEN 500 UNIT/ML kwikpen INJECT 60 UNITS UNDER THE SKIN BEFORE BREAKFAST, 50 UNITS BEFORE LUNCH, AND 35 UNITS BEFORE DINNER. INJECT 30 MINUTES BEFORE EACH MEAL  . HYDROcodone-acetaminophen (NORCO) 10-325 MG tablet Take 1 tablet by mouth every 8 (eight) hours as needed (pain).  . Insulin Glargine (TOUJEO SOLOSTAR) 300 UNIT/ML SOPN Inject 70 Units into the skin 2 (two) times daily. (Patient taking differently: Inject 65 Units into the skin 2 (two) times daily. )  . insulin regular human CONCENTRATED (HUMULIN R U-500 KWIKPEN) 500 UNIT/ML kwikpen Inject 35-60 Units into the skin See admin instructions. Inject 60 units subcutaneously with breakfast, 50 units with lunch and 35 units with supper  . liraglutide (VICTOZA) 18 MG/3ML SOPN  ADMINISTER 1.8 MG UNDER THE SKIN DAILY  . magnesium oxide (MAG-OX) 400 MG tablet Take 400 mg by mouth daily.   . pantoprazole (PROTONIX) 40 MG tablet Take 40 mg by mouth daily.  . potassium chloride SA (K-DUR,KLOR-CON) 20 MEQ tablet Take 1 tablet (20 mEq total) by mouth daily. X 3 days, then check potassium levels if needed to continue (Patient taking differently: Take 20 mEq by mouth daily. )  . pregabalin (LYRICA) 225 MG capsule Take 225 mg by mouth 2 (two) times daily.  . rosuvastatin (CRESTOR) 20 MG tablet Take 20 mg by mouth daily. Reported on 05/02/2015  . temazepam (RESTORIL) 30 MG capsule Take 1 capsule (30 mg total) by mouth at bedtime.  [name] Kitchen tiZANidine (ZANAFLEX) 4 MG tablet Take 4 mg by mouth 2 (two) times daily.    No current facility-administered medications on file prior to visit.     Chief Complaint  Patient presents with  . pulm consult    Pt was referred by PCP Dr. Rolland Porter MD at [facility]. Pt has dry cough, has SOB with exertion and at rest. Pt also states that at night she gasps for air; pt is not able to use cpap machine. Pt has congestion for last few weeks    Cardiac tests Echo 05/24/16 >> Mod LVH, EF 60 to 65%, grade 2 DD  Pulmonary tests CT angio chest 05/23/16 >> peribronchial thickening ABG 05/23/16 >> pH 7.36, PCO2 50.5, PO2 67.2  Past medical history She  has a past medical history of Acute metabolic encephalopathy (5/79/7282), Acute on chronic respiratory failure with hypoxia ([place]) (05/19/2016), Anxiety, ARF (acute renal failure) ([place]) (05/03/2014), Arthritis, Asthma, Asthma exacerbation attacks (12/28/2011), Bronchitis (12/28/2011), CAP (community acquired pneumonia) (07/25/2012), Chronic kidney disease (CKD), stage IV (severe) ([place]) (11/10/2014), Class 3 obesity due to excess calories with serious comorbidity and body mass index (BMI) of 40.0 to 44.9 in adult, Colles' fracture of left radius, Depression, Diabetes mellitus, Diabetes mellitus type 2 with complications, uncontrolled ([place]) (04/19/2008), Diabetic foot infection ([place]) (11/09/2014), Diabetic ketoacidosis without coma  associated with type 2 diabetes mellitus ([place]), Diabetic neuropathy ([place]) (11/10/2014), Diarrhea (0/60/1561), Diastolic dysfunction (53/07/9430), Difficult intravenous access, DKA, type 2 ([place]) (04/04/2016), Dyslipidemia (12/23/2008), Essential hypertension (04/19/2008), FATIGUE (07/12/2008), Femoral neck fracture ([place]) (07/16/2011), Femur fracture, left ([place]) (07/08/2014), Fever, Fracture of distal femur ([place]) (07/08/2014), GERD (gastroesophageal reflux disease), Hereditary and idiopathic peripheral neuropathy (04/19/2008), Hip fracture, left ([place]) (07/16/2011), Hip pain, HIV infection ([place]) (2000), Human immunodeficiency virus (HIV) disease ([place]) (04/19/2008), Hyperlipidemia, Hypertension, Hyponatremia (12/29/2011), Influenza A (12/29/2011), Influenza B, Insomnia (06/12/2016), Left hip pain (05/03/2014), Mood disorder ([place]) (04/19/2008), Neuropathy, NSTEMI (non-ST elevated myocardial infarction) ([place]) (05/19/2016), Obesity hypoventilation syndrome ([place]), Osteoporosis (07/08/2014), Pain, Perimenopausal symptoms (02/13/2012), Repeated falls (07/30/2011), Sepsis ([place]) (04/04/2016), Shortness of breath, SOB (shortness of breath), THRUSH (05/20/2008), and Tobacco use disorder (04/14/2010).  Vital signs BP 130/80 (BP Location: Left Arm, Cuff Size: Normal)   Pulse (!) 113   Ht 5' 10"  (1.778 m)   Wt 236 lb (107 kg)   LMP 11/16/2010   SpO2 92%   BMI 33.86 kg/m   History of present illness [name] is a 51 y.o. female smoker with cough.  She has been trying to transition from tobacco cigarettes to e cigarettes.  She has notice a cough for years.  She usually brings up clear sputum.  She will get wheezing at time.  She will get winded if she exerts too much.  She denies chest pain.  No fever,  sweats, or skin rashes.    She is followed in ID clinic for hx of HIV.  She does snore at night.  She will get coughing episodes while asleep.  She had H1N1 influenza in December 2013, and influenza B in April 2018.  ABG from  05/23/16 showed chronic hypoxia and hypercapnia.  CT chest from this same date showed bronchial thickening with bronchomalacia and atelectasis.  Physical exam  General - No distress ENT - No sinus tenderness, no oral exudate, no LAN, no thyromegaly, TM clear, pupils equal/reactive Cardiac - s1s2 regular, no murmur, pulses symmetric Chest - No wheeze/rales/dullness, good air entry, normal respiratory excursion Back - No focal tenderness Abd - Soft, non-tender, no organomegaly, + bowel sounds Ext - No edema Neuro - Normal strength, cranial nerves intact Skin - No rashes Psych - Normal mood, and behavior   CMP Latest Ref Rng & Units 11/20/2016 09/04/2016 09/04/2016  Glucose 65 - 99 mg/dL 98 422(H) 323(H)  BUN 7 - 25 mg/dL 27(H) 34(H) 32(H)  Creatinine 0.50 - 1.05 mg/dL 1.29(H) 1.37(H) 1.39(H)  Sodium 135 - 146 mmol/L 140 137 137  Potassium 3.5 - 5.3 mmol/L 4.4 4.4 5.9(H)  Chloride 98 - 110 mmol/L 99 101 101  CO2 20 - 32 mmol/L 34(H) 24 28  Calcium 8.6 - 10.4 mg/dL 9.8 9.2 8.9  Total Protein 6.1 - 8.1 g/dL 6.4 - -  Total Bilirubin 0.2 - 1.2 mg/dL 0.2 - -  Alkaline Phos 38 - 126 U/L - - -  AST 10 - 35 U/L 8(L) - -  ALT 6 - 29 U/L 5(L) - -     CBC Latest Ref Rng & Units 11/20/2016 09/04/2016 09/03/2016  WBC 3.8 - 10.8 Thousand/uL 10.5 15.5(H) 17.9(H)  Hemoglobin 11.7 - 15.5 g/dL 11.9 12.4 12.3  Hematocrit 35.0 - 45.0 % 39.5 41.3 41.8  Platelets 140 - 400 Thousand/uL 328 257 299     ABG    Component Value Date/Time   PHART 7.329 (L) 09/04/2016 0501   PCO2ART 55.7 (H) 09/04/2016 0501   PO2ART 69.0 (L) 09/04/2016 0501   HCO3 29.3 (H) 09/04/2016 0501   TCO2 31 09/04/2016 0501   ACIDBASEDEF 1.0 05/19/2016 2255   O2SAT 92.0 09/04/2016 0501    Discussion 51 yo female with tobacco abuse with persistent productie cough, dyspnea, and wheezing.  She has ABG showing chronic hypoxia and hypercapnia.  She has been using 2 liters oxygen at night.   Assessment/plan  Chronic bronchitis  with possible obstructive lung disease. - will have her use breo and prn albuterol - will need to arrange for pulmonary function tests at some point to further assess  Chronic hypoxic and hypercapnic respiratory failure. - could be related to obstructive lung disease - concern she might have bronchomalacia contributing to this - she might also have sleep disordered breathing - will arrange for ONO with 2 liters oxygen - depending on the results will determine if she needs an in lab sleep study  Tobacco abuse. - discussed options to assist with smoking cessation - discussed potential side effects from electronic cigarette use   Patient Instructions  Will arrange for overnight oxygen test with 2 liters oxygen  Breo one puff daily  Proair two puffs every 6 hours as needed for cough, wheeze, or chest congestion  Follow up in 6 weeks with Dr. Halford Chessman or Nurse Practitioner    Chesley Mires, MD [facility] Pager:  [phone] 11/28/2016, 4:55 PM

## 2016-12-03 DIAGNOSIS — F411 Generalized anxiety disorder: Secondary | ICD-10-CM | POA: Diagnosis not present

## 2016-12-03 DIAGNOSIS — F314 Bipolar disorder, current episode depressed, severe, without psychotic features: Secondary | ICD-10-CM | POA: Diagnosis not present

## 2016-12-04 ENCOUNTER — Encounter: Payer: Self-pay | Admitting: Pulmonary Disease

## 2016-12-04 DIAGNOSIS — M25552 Pain in left hip: Secondary | ICD-10-CM | POA: Diagnosis not present

## 2016-12-04 DIAGNOSIS — Z79899 Other long term (current) drug therapy: Secondary | ICD-10-CM | POA: Diagnosis not present

## 2016-12-04 DIAGNOSIS — G8929 Other chronic pain: Secondary | ICD-10-CM | POA: Diagnosis not present

## 2016-12-04 DIAGNOSIS — Z89612 Acquired absence of left leg above knee: Secondary | ICD-10-CM | POA: Diagnosis not present

## 2016-12-13 ENCOUNTER — Telehealth: Payer: Self-pay | Admitting: Pulmonary Disease

## 2016-12-13 NOTE — Telephone Encounter (Signed)
Saw where an order was placed at her OV with Dr. Halford Chessman for her to have an ONO w/ 2L O2.    Please advise on the status of this order.  Thanks!

## 2016-12-14 NOTE — Telephone Encounter (Signed)
I have sent a community message to [name] with [facility] asking them why this patient has not heard from them yet

## 2016-12-15 ENCOUNTER — Other Ambulatory Visit: Payer: Self-pay | Admitting: Endocrinology

## 2016-12-17 DIAGNOSIS — F314 Bipolar disorder, current episode depressed, severe, without psychotic features: Secondary | ICD-10-CM | POA: Diagnosis not present

## 2016-12-17 DIAGNOSIS — F411 Generalized anxiety disorder: Secondary | ICD-10-CM | POA: Diagnosis not present

## 2016-12-17 NOTE — Telephone Encounter (Signed)
See [name]'s response below Clyde Lundborg S        This pt will need to call into our office at [phone]. We have tried to contact the pt on multiple occasion without success. Thank you!    I did call Mrs. Creveling on Friday and explained that [facility] had been trying to get in touch with her. I gave her the number above and ask her to call [facility] to get this setup

## 2016-12-19 DIAGNOSIS — Z79899 Other long term (current) drug therapy: Secondary | ICD-10-CM | POA: Diagnosis not present

## 2016-12-19 DIAGNOSIS — Z5181 Encounter for therapeutic drug level monitoring: Secondary | ICD-10-CM | POA: Diagnosis not present

## 2016-12-21 DIAGNOSIS — M6281 Muscle weakness (generalized): Secondary | ICD-10-CM | POA: Diagnosis not present

## 2016-12-21 DIAGNOSIS — J45901 Unspecified asthma with (acute) exacerbation: Secondary | ICD-10-CM | POA: Diagnosis not present

## 2016-12-21 DIAGNOSIS — S72002D Fracture of unspecified part of neck of left femur, subsequent encounter for closed fracture with routine healing: Secondary | ICD-10-CM | POA: Diagnosis not present

## 2016-12-21 DIAGNOSIS — M545 Low back pain: Secondary | ICD-10-CM | POA: Diagnosis not present

## 2016-12-21 DIAGNOSIS — J449 Chronic obstructive pulmonary disease, unspecified: Secondary | ICD-10-CM | POA: Diagnosis not present

## 2016-12-21 DIAGNOSIS — N184 Chronic kidney disease, stage 4 (severe): Secondary | ICD-10-CM | POA: Diagnosis not present

## 2016-12-21 DIAGNOSIS — J45909 Unspecified asthma, uncomplicated: Secondary | ICD-10-CM | POA: Diagnosis not present

## 2016-12-24 ENCOUNTER — Other Ambulatory Visit: Payer: Self-pay | Admitting: Endocrinology

## 2017-01-03 DIAGNOSIS — M25552 Pain in left hip: Secondary | ICD-10-CM | POA: Diagnosis not present

## 2017-01-03 DIAGNOSIS — Z79899 Other long term (current) drug therapy: Secondary | ICD-10-CM | POA: Diagnosis not present

## 2017-01-03 DIAGNOSIS — G8929 Other chronic pain: Secondary | ICD-10-CM | POA: Diagnosis not present

## 2017-01-09 ENCOUNTER — Ambulatory Visit (INDEPENDENT_AMBULATORY_CARE_PROVIDER_SITE_OTHER): Payer: Medicare HMO | Admitting: Pulmonary Disease

## 2017-01-09 ENCOUNTER — Encounter: Payer: Self-pay | Admitting: Pulmonary Disease

## 2017-01-09 VITALS — BP 128/86 | HR 94 | Ht 70.0 in | Wt 236.0 lb

## 2017-01-09 DIAGNOSIS — J411 Mucopurulent chronic bronchitis: Secondary | ICD-10-CM | POA: Diagnosis not present

## 2017-01-09 DIAGNOSIS — J9611 Chronic respiratory failure with hypoxia: Secondary | ICD-10-CM | POA: Diagnosis not present

## 2017-01-09 DIAGNOSIS — J9612 Chronic respiratory failure with hypercapnia: Secondary | ICD-10-CM | POA: Diagnosis not present

## 2017-01-09 NOTE — Patient Instructions (Signed)
Can try using mucinex to help with chest congestion  Will schedule pulmonary function test and call with results  Follow up in 6 months

## 2017-01-09 NOTE — Progress Notes (Signed)
[facility]  Chief Complaint  Patient presents with  . Follow-up    pt is doing better since being on Breo on daily basis    Vital signs: BP 128/86 (BP Location: Left Arm, Cuff Size: Normal)   Pulse 94   Ht _0  (1.778 m)   Wt 236 lb (107 kg)   LMP 11/16/2010   SpO2 93%   BMI 33.86 kg/m   History of Present Illness: [name] is a 52 y.o. female smoker with chronic bronchitis, and chronic respiratory failure.  She has been doing better since starting breo.  She still needs to use albuterol several times per day.  She has cough with clear sputum.  She is still smoking 1/2 ppd.  She hasn't been using E cigarettes as much.  She doesn't think nicotine patch or gum will work.  She doesn't want to add any other medicines.   Physical Exam:  General - pleasant Eyes - pupils reactive ENT - no sinus tenderness, no oral exudate, no LAN Cardiac - regular, no murmur Chest - no wheeze, rales Abd - soft, non tender Ext - Lt leg prosthesis Skin - no rashes Neuro - normal strength Psych - normal mood  Assessment/Plan:  Chronic bronchitis with possible obstructive lung disease. - continue breo and prn albuterol - will arrange for pulmonary function test to assess for COPD  Chronic hypoxic and hypercapnic respiratory failure. - could be related to obstructive lung disease - concern she might have bronchomalacia contributing to this - she might also have sleep disordered breathing - continue 2 liters oxygen at night  Tobacco abuse. - discussed options to assist with smoking cessation - discussed possible side effects from E cigarettes   Patient Instructions  Can try using mucinex to help with chest congestion  Will schedule pulmonary function test and call with results  Follow up in 6 months    Chesley Mires, MD [place] 01/09/2017, 5:04 PM Pager:  [phone]  Flow Sheet   Cardiac tests: Echo  05/24/16 >> Mod LVH, EF 60 to 65%, grade 2 DD  Pulmonary tests CT angio chest 05/23/16 >> peribronchial thickening ABG 05/23/16 >> pH 7.36, PCO2 50.5, PO2 67.2  Past Medical History: She  has a past medical history of Acute metabolic encephalopathy (3/47/4259), Acute on chronic respiratory failure with hypoxia ([place]) (05/19/2016), Anxiety, ARF (acute renal failure) ([place]) (05/03/2014), Arthritis, Asthma, Asthma exacerbation attacks (12/28/2011), Bronchitis (12/28/2011), CAP (community acquired pneumonia) (07/25/2012), Chronic kidney disease (CKD), stage IV (severe) ([place]) (11/10/2014), Class 3 obesity due to excess calories with serious comorbidity and body mass index (BMI) of 40.0 to 44.9 in adult, Colles' fracture of left radius, Depression, Diabetes mellitus, Diabetes mellitus type 2 with complications, uncontrolled ([place]) (04/19/2008), Diabetic foot infection ([place]) (11/09/2014), Diabetic ketoacidosis without coma associated with type 2 diabetes mellitus ([place]), Diabetic neuropathy ([place]) (11/10/2014), Diarrhea (5/63/8756), Diastolic dysfunction (43/03/2949), Difficult intravenous access, DKA, type 2 ([place]) (04/04/2016), Dyslipidemia (12/23/2008), Essential hypertension (04/19/2008), FATIGUE (07/12/2008), Femoral neck fracture ([place]) (07/16/2011), Femur fracture, left ([place]) (07/08/2014), Fever, Fracture of distal femur ([place]) (07/08/2014), GERD (gastroesophageal reflux disease), Hereditary and idiopathic peripheral neuropathy (04/19/2008), Hip fracture, left ([place]) (07/16/2011), Hip pain, HIV infection ([place]) (2000), Human immunodeficiency virus (HIV) disease ([place]) (04/19/2008), Hyperlipidemia, Hypertension, Hyponatremia (12/29/2011), Influenza A (12/29/2011), Influenza B, Insomnia (06/12/2016), Left hip pain (05/03/2014), Mood disorder ([place]) (04/19/2008), Neuropathy, NSTEMI (non-ST elevated myocardial infarction) ([place]) (05/19/2016), Obesity hypoventilation syndrome ([place]), Osteoporosis (07/08/2014), Pain, Perimenopausal symptoms (02/13/2012),  Repeated falls (  07/30/2011), Sepsis ([place]) (04/04/2016), Shortness of breath, SOB (shortness of breath), THRUSH (05/20/2008), and Tobacco use disorder (04/14/2010).  Past Surgical History: She  has a past surgical history that includes Hip pinning, cannulated (07/16/2011); Hip pinning, cannulated (Left, 04/09/2012); Hip Arthroplasty (Left, 05/05/2014); Cast application (Left, 04/04/6284); Hardware Removal (Left, 05/05/2014); Cardiac catheterization (N/A, 11/15/2014); Cardiac catheterization (11/15/2014); Cardiac catheterization (Left, 11/15/2014); I&D extremity (Left, 11/16/2014); and Amputation (Left, 11/18/2014).  Family History: Her family history includes Diabetes in her mother; Heart disease in her mother; Hypertension in her father and mother; Vision loss in her mother.  Social History: She  reports that she has been smoking e-cigarettes and cigarettes.  She has a 17.50 pack-year smoking history. she has never used smokeless tobacco. She reports that she does not drink alcohol or use drugs.  Medications: Allergies as of 01/09/2017      Reactions   Cortizone-10 [hydrocortisone] Rash   Per patient, given local injection at knee and developed rash at local site.       Medication List        Accurate as of 01/09/17  5:04 PM. Always use your most recent med list.          ACCU-CHEK AVIVA PLUS test strip Generic drug:  glucose blood TEST BLOOD SUGAR THREE TIMES DAILY AS DIRECTED   ACCU-CHEK AVIVA PLUS w/Device Kit USE TO TEST BLOOD SUGAR DAILY   albuterol 108 (90 Base) MCG/ACT inhaler Commonly known as:  PROAIR HFA Inhale 2 puffs into the lungs every 6 (six) hours as needed for wheezing or shortness of breath.   aspirin 81 MG EC tablet Take 1 tablet (81 mg total) by mouth daily.   clonazePAM 0.5 MG tablet Commonly known as:  KLONOPIN Take 0.5 mg by mouth 3 (three) times daily as needed for anxiety.   diphenoxylate-atropine 2.5-0.025 MG tablet Commonly known as:  LOMOTIL Take 1 tablet by  mouth 4 (four) times daily as needed for diarrhea or loose stools.   DULoxetine 60 MG capsule Commonly known as:  CYMBALTA Take 60 mg by mouth daily.   fenofibrate 145 MG tablet Commonly known as:  TRICOR Take 145 mg by mouth daily.   fluticasone furoate-vilanterol 100-25 MCG/INH Aepb Commonly known as:  BREO ELLIPTA Inhale 1 puff into the lungs daily.   GENVOYA 150-150-200-10 MG Tabs tablet Generic drug:  elvitegravir-cobicistat-emtricitabine-tenofovir TAKE 1 TABLET BY MOUTH EVERY DAY   HUMULIN R U-500 KWIKPEN 500 UNIT/ML kwikpen Generic drug:  insulin regular human CONCENTRATED Inject 35-60 Units into the skin See admin instructions. Inject 60 units subcutaneously with breakfast, 50 units with lunch and 35 units with supper   HUMULIN R U-500 KWIKPEN 500 UNIT/ML kwikpen Generic drug:  insulin regular human CONCENTRATED INJECT 60 UNITS UNDER THE SKIN BEFORE BREAKFAST, 50 UNITS BEFORE LUNCH, AND 35 UNITS BEFORE DINNER. INJECT 30 MINUTES BEFORE EACH MEAL   HYDROcodone-acetaminophen 10-325 MG tablet Commonly known as:  NORCO Take 1 tablet by mouth every 8 (eight) hours as needed (pain).   Insulin Glargine 300 UNIT/ML Sopn Commonly known as:  TOUJEO SOLOSTAR Inject 70 Units into the skin 2 (two) times daily.   liraglutide 18 MG/3ML Sopn Commonly known as:  VICTOZA INJECT 1.8 MG UNDER THE SKIN DAILY   magnesium oxide 400 MG tablet Commonly known as:  MAG-OX Take 400 mg by mouth daily.   pantoprazole 40 MG tablet Commonly known as:  PROTONIX Take 40 mg by mouth daily.   potassium chloride SA 20 MEQ tablet Commonly known as:  K-DUR,KLOR-CON Take 1 tablet (20 mEq total) by mouth daily. X 3 days, then check potassium levels if needed to continue   pregabalin 225 MG capsule Commonly known as:  LYRICA Take 225 mg by mouth 2 (two) times daily.   rosuvastatin 20 MG tablet Commonly known as:  CRESTOR Take 20 mg by mouth daily. Reported on 05/02/2015   tiZANidine 4 MG  tablet Commonly known as:  ZANAFLEX Take 4 mg by mouth 2 (two) times daily.

## 2017-01-14 ENCOUNTER — Emergency Department (HOSPITAL_COMMUNITY)
Admission: EM | Admit: 2017-01-14 | Discharge: 2017-01-15 | Disposition: A | Discharge status: Home / Self Care | Payer: Medicare HMO | Attending: Emergency Medicine | Admitting: Emergency Medicine

## 2017-01-14 ENCOUNTER — Encounter (HOSPITAL_COMMUNITY): Payer: Self-pay | Admitting: Emergency Medicine

## 2017-01-14 DIAGNOSIS — J45909 Unspecified asthma, uncomplicated: Secondary | ICD-10-CM | POA: Insufficient documentation

## 2017-01-14 DIAGNOSIS — Z7982 Long term (current) use of aspirin: Secondary | ICD-10-CM | POA: Insufficient documentation

## 2017-01-14 DIAGNOSIS — I129 Hypertensive chronic kidney disease with stage 1 through stage 4 chronic kidney disease, or unspecified chronic kidney disease: Secondary | ICD-10-CM | POA: Insufficient documentation

## 2017-01-14 DIAGNOSIS — F17210 Nicotine dependence, cigarettes, uncomplicated: Secondary | ICD-10-CM | POA: Insufficient documentation

## 2017-01-14 DIAGNOSIS — Z9119 Patient's noncompliance with other medical treatment and regimen: Secondary | ICD-10-CM | POA: Diagnosis not present

## 2017-01-14 DIAGNOSIS — E1165 Type 2 diabetes mellitus with hyperglycemia: Secondary | ICD-10-CM | POA: Diagnosis not present

## 2017-01-14 DIAGNOSIS — F410 Panic disorder [episodic paroxysmal anxiety] without agoraphobia: Secondary | ICD-10-CM | POA: Insufficient documentation

## 2017-01-14 DIAGNOSIS — Z794 Long term (current) use of insulin: Secondary | ICD-10-CM | POA: Diagnosis not present

## 2017-01-14 DIAGNOSIS — R739 Hyperglycemia, unspecified: Secondary | ICD-10-CM | POA: Diagnosis present

## 2017-01-14 DIAGNOSIS — N184 Chronic kidney disease, stage 4 (severe): Secondary | ICD-10-CM | POA: Diagnosis not present

## 2017-01-14 DIAGNOSIS — R7309 Other abnormal glucose: Secondary | ICD-10-CM | POA: Diagnosis not present

## 2017-01-14 DIAGNOSIS — Z9114 Patient's other noncompliance with medication regimen: Secondary | ICD-10-CM | POA: Diagnosis not present

## 2017-01-14 LAB — BASIC METABOLIC PANEL
Anion gap: 12 (ref 5–15)
BUN: 30 mg/dL — ABNORMAL HIGH (ref 6–20)
CALCIUM: 9.6 mg/dL (ref 8.9–10.3)
CO2: 26 mmol/L (ref 22–32)
Chloride: 100 mmol/L — ABNORMAL LOW (ref 101–111)
Creatinine, Ser: 1.11 mg/dL — ABNORMAL HIGH (ref 0.44–1.00)
GFR, EST NON AFRICAN AMERICAN: 56 mL/min — AB (ref >60)
GLUCOSE: 314 mg/dL — AB (ref 65–99)
Potassium: 4.1 mmol/L (ref 3.5–5.1)
Sodium: 138 mmol/L (ref 135–145)

## 2017-01-14 LAB — I-STAT BETA HCG BLOOD, ED (MC, WL, AP ONLY)

## 2017-01-14 LAB — CBC
HCT: 41.9 % (ref 36.0–46.0)
HEMOGLOBIN: 13.3 g/dL (ref 12.0–15.0)
MCH: 26.6 pg (ref 26.0–34.0)
MCHC: 31.7 g/dL (ref 30.0–36.0)
MCV: 83.8 fL (ref 78.0–100.0)
PLATELETS: 321 10*3/uL (ref 150–400)
RBC: 5 MIL/uL (ref 3.87–5.11)
RDW: 21.4 % — AB (ref 11.5–15.5)
WBC: 13.5 10*3/uL — ABNORMAL HIGH (ref 4.0–10.5)

## 2017-01-14 LAB — CBG MONITORING, ED: GLUCOSE-CAPILLARY: 318 mg/dL — AB (ref 65–99)

## 2017-01-14 NOTE — ED Provider Notes (Signed)
[facility] EMERGENCY DEPARTMENT Provider Note   CSN: [id number] Arrival date & time: 01/14/17  2158     History   Chief Complaint Chief Complaint  Patient presents with  . Hyperglycemia    HPI [name] is a 52 y.o. female.  [name] is a 52 y.o. Female who presents to the ED complaining of a shaking episode today. Patient reports around 6 pm today she had a shaking episode. She reports she was feeling anxious and believes she had a panic attack. She reports her mother died in 12-31-2015 and she has been sad about this. When she said she has not been feeling well she means that she has been feeling sad. She denies any physical complaints and tells me that she would like to go home now.  She tells me that currently she is feeling much better.  She sees her psychiatrist tomorrow and will speak to him about what happened.  She tells me that she forgot to take her medications today.  She tells me she has plenty at home does not need any refills.  She denies feeling suicidal or homicidal. No treatments prior to arrival. She denies physical complaints.  She denies fevers, coughing, shortness of breath, abdominal pain, nausea, vomiting, diarrhea, urinary symptoms, chest pain, suicidal ideations, homicidal ideations, or rashes.    The history is provided by the patient, medical records and the EMS personnel. No language interpreter was used.  Hyperglycemia  Associated symptoms: no abdominal pain, no chest pain, no dysuria, no fever, no nausea, no shortness of breath and no vomiting     Past Medical History:  Diagnosis Date  . Acute metabolic encephalopathy 04/09/8117  . Acute on chronic respiratory failure with hypoxia ([place]) 05/19/2016  . Anxiety   . ARF (acute renal failure) ([place]) 05/03/2014  . Arthritis    LEFT HIP  . Asthma    HOSPITALIZED WITH EXCERBATION OF ASTHMA - AND BRONCHITIS AND THE FLU DEC 2013  . Asthma exacerbation attacks 12/28/2011  .  Bronchitis 12/28/2011  . CAP (community acquired pneumonia) 07/25/2012  . Chronic kidney disease (CKD), stage IV (severe) ([place]) 11/10/2014  . Class 3 obesity due to excess calories with serious comorbidity and body mass index (BMI) of 40.0 to 44.9 in adult   . Colles' fracture of left radius   . Depression   . Diabetes mellitus    ON INSULIN AND ORAL MEDICATIONS  . Diabetes mellitus type 2 with complications, uncontrolled ([place]) 04/19/2008   Qualifier: Diagnosis of  By: Tomma Lightning MD, Claiborne Billings     . Diabetic foot infection ([place]) 11/09/2014  . Diabetic ketoacidosis without coma associated with type 2 diabetes mellitus ([place])   . Diabetic neuropathy ([place]) 11/10/2014  . Diarrhea 08/30/2008   Qualifier: Diagnosis of  By: Tomma Lightning MD, Claiborne Billings    . Diastolic dysfunction 14/07/8293  . Difficult intravenous access   . DKA, type 2 ([place]) 04/04/2016  . Dyslipidemia 12/23/2008   Qualifier: Diagnosis of  By: Tomma Lightning MD, Claiborne Billings    . Essential hypertension 04/19/2008   Qualifier: Diagnosis of  By: Tomma Lightning MD, Claiborne Billings    . FATIGUE 07/12/2008   Qualifier: Diagnosis of  By: Tomma Lightning MD, Claiborne Billings    . Femoral neck fracture ([place]) 07/16/2011  . Femur fracture, left ([place]) 07/08/2014  . Fever   . Fracture of distal femur ([place]) 07/08/2014  . GERD (gastroesophageal reflux disease)   . Hereditary and idiopathic peripheral neuropathy 04/19/2008   Qualifier: Diagnosis of  By: Tomma Lightning MD, Claiborne Billings    . Hip fracture, left ([place]) 07/16/2011  . Hip pain   . HIV infection ([place]) 2000  . Human immunodeficiency virus (HIV) disease ([place]) 04/19/2008   [id number] +   . Hyperlipidemia   . Hypertension   . Hyponatremia 12/29/2011  . Influenza A 12/29/2011  . Influenza B   . Insomnia 06/12/2016  . Left hip pain 05/03/2014  . Mood disorder ([place]) 04/19/2008   Qualifier: Diagnosis of  By: Tomma Lightning MD, Claiborne Billings    . Neuropathy    NEUROPATHY HANDS AND FEET  . NSTEMI (non-ST elevated myocardial infarction) ([place]) 05/19/2016  . Obesity hypoventilation syndrome ([place])     . Osteoporosis 07/08/2014  . Pain    SEVERE PAIN LEFT HIP - HX OF LEFT HIP PINNING JULY 2013  . Perimenopausal symptoms 02/13/2012   LMP around 2011. On estrace and provera since around 2012 for hot flashes, mood swings. Estrace 2 mg daily, Provera 2.5 mg for 5 days each month.   . Repeated falls 07/30/2011  . Sepsis ([place]) 04/04/2016  . Shortness of breath    ALLERGIES ARE "ACTING UP"  . SOB (shortness of breath)   . THRUSH 05/20/2008   Qualifier: Diagnosis of  By: Tomma Lightning MD, Claiborne Billings      . Tobacco use disorder 04/14/2010    Patient Active Problem List   Diagnosis Date Noted  . Acute on chronic respiratory failure with hypoxia and hypercapnia ([place]) 08/19/2016  . Leukocytosis 08/19/2016  . CKD (chronic kidney disease), stage III ([place]) 08/19/2016  . Altered mental status 08/19/2016  . Asthma exacerbation 06/18/2016  . Insomnia 06/12/2016  . Difficult intravenous access   . Acute on chronic respiratory failure with hypoxia ([place]) 05/19/2016  . NSTEMI (non-ST elevated myocardial infarction) ([place]) 05/19/2016  . Acute metabolic encephalopathy 79/02/4095  . Hyperlipidemia   . Class 3 obesity due to excess calories with serious comorbidity and body mass index (BMI) of 40.0 to 44.9 in adult   . Obesity hypoventilation syndrome ([place])   . Diastolic dysfunction 35/32/9924  . Diabetic neuropathy ([place]) 11/10/2014  . Fracture of distal femur ([place]) 07/08/2014  . Osteoporosis 07/08/2014  . Femur fracture, left ([place]) 07/08/2014  . Colles' fracture of left radius   . Left hip pain 05/03/2014  . Perimenopausal symptoms 02/13/2012  . Hyponatremia 12/29/2011  . Asthma exacerbation attacks 12/28/2011  . Bronchitis 12/28/2011  . Repeated falls 07/30/2011  . Hip fracture, left ([place]) 07/16/2011  . Femoral neck fracture ([place]) 07/16/2011  . Dyslipidemia 12/23/2008  . THRUSH 05/20/2008  . Human immunodeficiency virus (HIV) disease ([place]) 04/19/2008  . Diabetes mellitus type 2 with complications,  uncontrolled ([place]) 04/19/2008  . Mood disorder ([place]) 04/19/2008  . Hereditary and idiopathic peripheral neuropathy 04/19/2008  . Essential hypertension 04/19/2008    Past Surgical History:  Procedure Laterality Date  . AMPUTATION Left 11/18/2014   Procedure: LEFT BELOW KNEE AMPUTATION;  Surgeon: Leandrew Koyanagi, MD;  Location: [place];  Service: Orthopedics;  Laterality: Left;  . CAST APPLICATION Left 02/07/8339   Procedure: CAST APPLICATION (FIBERGLASS);  Surgeon: Latanya Maudlin, MD;  Location: [facility];  Service: Orthopedics;  Laterality: Left;  CLOSED REDUCTION OF LEFT COLLES FRACTURE WITH SHORT ARM CAST  . HARDWARE REMOVAL Left 05/05/2014   Procedure: HARDWARE REMOVAL LEFT HIP;  Surgeon: Latanya Maudlin, MD;  [facility]: [facility];  Service: Orthopedics;  Laterality: Left;  REMOVAL BIOMET 6.5-8.0 CANNULATED SCREW  . HIP ARTHROPLASTY Left 05/05/2014   Procedure: ARTHROPLASTY OPEN REDUCTION INTERNAL  FIXATION LEFT HIP AND REMOVAL OF TWO CANNULATED SCREW;  Surgeon: Latanya Maudlin, MD;  Location: [facility];  Service: Orthopedics;  Laterality: Left;  . HIP PINNING,CANNULATED  07/16/2011   Procedure: CANNULATED HIP PINNING;  Surgeon: Gearlean Alf, MD;  [facility]: [facility];  Service: Orthopedics;  Laterality: Left;  . HIP PINNING,CANNULATED Left 04/09/2012   Procedure: CANNULATED HIP PINNING AND HARDWARE REVISION;  Surgeon: Gearlean Alf, MD;  [facility]: [facility];  Service: Orthopedics;  Laterality: Left;  . I&D EXTREMITY Left 11/16/2014   Procedure:  DEBRIDEMENT OF LEFT FOOT POSSIBLE APPLICATION OF INTEGRIA AND VAC ;  Surgeon: Irene Limbo, MD;  Location: [facility];  Service: Plastics;  Laterality: Left;  . PERIPHERAL VASCULAR CATHETERIZATION N/A 11/15/2014   Procedure: Abdominal Aortogram;  Surgeon: Conrad Tiburon, MD;  Location: [facility];  Service: Cardiovascular;  Laterality: N/A;  . PERIPHERAL VASCULAR CATHETERIZATION  11/15/2014   Procedure: Lower Extremity Angiography;  Surgeon: Conrad Harnett, MD;   Location: [place] [facility];  Service: Cardiovascular;;  . PERIPHERAL VASCULAR CATHETERIZATION Left 11/15/2014   Procedure: Peripheral Vascular Intervention;  Surgeon: Conrad [place]ville, MD;  Location: [facility];  Service: Cardiovascular;  Laterality: Left;  popliteal artery stenting    OB History    Gravida Para Term Preterm AB Living   _0 0 1 1   SAB TAB Ectopic Multiple Live Births   1   0 0         Home Medications    Prior to Admission medications   Medication Sig Start Date End Date Taking? Authorizing Provider  albuterol (PROAIR HFA) 108 (90 Base) MCG/ACT inhaler Inhale 2 puffs into the lungs every 6 (six) hours as needed for wheezing or shortness of breath. 11/28/16  Yes Chesley Mires, MD  aspirin EC 81 MG EC tablet Take 1 tablet (81 mg total) by mouth daily. Patient taking differently: Take 81 mg by mouth.  05/25/16  Yes Rai, Ripudeep K, MD  clonazePAM (KLONOPIN) 0.5 MG tablet Take 0.5 mg by mouth 3 (three) times daily as needed for anxiety.  08/16/16  Yes [provider]  diphenoxylate-atropine (LOMOTIL) 2.5-0.025 MG tablet Take 1 tablet by mouth 4 (four) times daily as needed for diarrhea or loose stools. 11/20/16  Yes Campbell Riches, MD  DULoxetine (CYMBALTA) 60 MG capsule Take 60 mg by mouth daily. 08/16/16  Yes [provider]  fenofibrate (TRICOR) 145 MG tablet Take 145 mg by mouth daily.   Yes [provider]  fluticasone furoate-vilanterol (BREO ELLIPTA) 100-25 MCG/INH AEPB Inhale 1 puff into the lungs daily. 11/28/16  Yes Chesley Mires, MD  GENVOYA 150-150-200-10 MG TABS tablet TAKE 1 TABLET BY MOUTH EVERY DAY 12/19/15  Yes Campbell Riches, MD  HUMULIN R U-500 KWIKPEN 500 UNIT/ML kwikpen INJECT 60 UNITS UNDER THE SKIN BEFORE BREAKFAST, 50 UNITS BEFORE LUNCH, AND 35 UNITS BEFORE DINNER. INJECT [place] MEAL 09/19/16  Yes Elayne Snare, MD  HYDROcodone-acetaminophen [facility]) 10-325 MG tablet Take 1 tablet by mouth every 8  (eight) hours as needed (pain).   Yes [provider]  Insulin Glargine (TOUJEO SOLOSTAR) 300 UNIT/ML SOPN Inject 70 Units into the skin 2 (two) times daily. Patient taking differently: Inject 65 Units into the skin 2 (two) times daily.  08/21/16  Yes Geradine Girt, DO  liraglutide (VICTOZA) 18 MG/3ML SOPN INJECT 1.8 MG UNDER THE SKIN DAILY 12/26/16  Yes Elayne Snare, MD  magnesium oxide (MAG-OX) 400  MG tablet Take 400 mg by mouth daily.   Yes [provider]  pantoprazole (PROTONIX) 40 MG tablet Take 40 mg by mouth daily. 09/02/14  Yes [provider]  potassium chloride SA (K-DUR,KLOR-CON) 20 MEQ tablet Take 1 tablet (20 mEq total) by mouth daily. X 3 days, then check potassium levels if needed to continue Patient taking differently: Take 20 mEq by mouth daily.  05/25/16  Yes Rai, Ripudeep K, MD  pregabalin (LYRICA) 225 MG capsule Take 225 mg by mouth 2 (two) times daily.   Yes [provider]  rosuvastatin (CRESTOR) 20 MG tablet Take 20 mg by mouth daily. Reported on 05/02/2015 09/02/14  Yes [provider]  tiZANidine (ZANAFLEX) 4 MG tablet Take 4 mg by mouth 2 (two) times daily.  03/06/16  Yes [provider]  [place] test strip TEST BLOOD SUGAR THREE TIMES DAILY AS DIRECTED 11/28/16   Elayne Snare, MD  Blood Glucose Monitoring Suppl (ACCU-CHEK AVIVA PLUS) w/Device KIT USE TO TEST BLOOD SUGAR DAILY 07/03/16   Elayne Snare, MD    Family History Family History  Problem Relation Age of Onset  . Diabetes Mother   . Hypertension Mother   . Vision loss Mother   . Heart disease Mother   . Hypertension Father   . Thyroid disease Neg Hx     Social History Social History   Tobacco Use  . Smoking status: Current Every Day Smoker    Packs/day: 0.50    Years: 35.00    Pack years: 17.50    Types: E-cigarettes, Cigarettes  . Smokeless tobacco: Never Used  Substance Use Topics  . Alcohol use: No    Alcohol/week: 0.0 oz    Comment: no  drink since 2008  . Drug use: No    Comment: hx of crack/cocaine, clean 8 years     Allergies   Cortizone-10 [hydrocortisone]   Review of Systems Review of Systems  Constitutional: Negative for chills and fever.  HENT: Negative for congestion and sore throat.   Eyes: Negative for visual disturbance.  Respiratory: Negative for cough and shortness of breath.   Cardiovascular: Negative for chest pain.  Gastrointestinal: Negative for abdominal pain, diarrhea, nausea and vomiting.  Genitourinary: Negative for dysuria.  Musculoskeletal: Negative for back pain.  Skin: Negative for rash.  Neurological: Negative for syncope and headaches.  Psychiatric/Behavioral: Positive for dysphoric mood. Negative for hallucinations, sleep disturbance and suicidal ideas. The patient is nervous/anxious.      Physical Exam Updated Vital Signs BP 139/88   Pulse (!) 106   Temp 99.5 F (37.5 C) (Oral)   Resp 18   Ht _0  (1.778 m)   Wt 107 kg (236 lb)   LMP 11/16/2010   SpO2 93%   BMI 33.86 kg/m   Physical Exam  Constitutional: She is oriented to person, place, and time. She appears well-developed and well-nourished. No distress.  Nontoxic-appearing.  HENT:  Head: Normocephalic and atraumatic.  Mouth/Throat: Oropharynx is clear and moist.  Eyes: Conjunctivae are normal. Pupils are equal, round, and reactive to light. Right eye exhibits no discharge. Left eye exhibits no discharge.  Neck: Neck supple.  Cardiovascular: Regular rhythm, normal heart sounds and intact distal pulses. Exam reveals no gallop and no friction rub.  No murmur heard. HR 100.   Pulmonary/Chest: Effort normal and breath sounds normal. No respiratory distress. She has no wheezes. She has no rales.  Abdominal: Soft. There is no tenderness.  Musculoskeletal: She exhibits no edema.  Left below the knee amputation.  Lymphadenopathy:    She has no cervical adenopathy.  Neurological: She is alert and oriented to person,  place, and time. Coordination normal.  Skin: Skin is warm and dry. Capillary refill takes less than 2 seconds. No rash noted. She is not diaphoretic. No erythema. No pallor.  Psychiatric: Her speech is normal and behavior is normal. Her mood appears anxious. She does not exhibit a depressed mood. She expresses no homicidal and no suicidal ideation.  Patient appears slightly anxious. She has good eye contact. Speech is clear and coherent. She denies SI or HI. She does not appear to be responding to internal stimuli.   Nursing note and vitals reviewed.    ED Treatments / Results  Labs (all labs ordered are listed, but only abnormal results are displayed) Labs Reviewed  BASIC METABOLIC PANEL - Abnormal; Notable for the following components:      Result Value   Chloride 100 (*)    Glucose, Bld 314 (*)    BUN 30 (*)    Creatinine, Ser 1.11 (*)    GFR calc non Af Amer 56 (*)    All other components within normal limits  CBC - Abnormal; Notable for the following components:   WBC 13.5 (*)    RDW 21.4 (*)    All other components within normal limits  CBG MONITORING, ED - Abnormal; Notable for the following components:   Glucose-Capillary 318 (*)    All other components within normal limits  I-STAT BETA HCG BLOOD, ED ([facility], [facility], [facility] ONLY)    EKG  EKG Interpretation None       Radiology No results found.  Procedures Procedures (including critical care time)  Medications Ordered in ED Medications - No data to display   Initial Impression / Assessment and Plan / ED Course  I have reviewed the triage vital signs and the nursing notes.  Pertinent labs & imaging results that were available during my care of the patient were reviewed by me and considered in my medical decision making (see chart for details).     This is a 52 y.o. Female who presents to the ED complaining of a shaking episode today. Patient reports around 6 pm today she had a shaking episode. She reports she was  feeling anxious and believes she had a panic attack. She reports her mother died in 12-31-15 and she has been sad about this. When she said she has not been feeling well she means that she has been feeling sad. She denies any physical complaints and tells me that she would like to go home now.  She tells me that currently she is feeling much better.  She sees her psychiatrist tomorrow and will speak to him about what happened.  She tells me that she forgot to take her medications today.  She tells me she has plenty at home does not need any refills.  She denies feeling suicidal or homicidal. No treatments prior to arrival. She denies physical complaints.  On exam the patient is afebrile nontoxic-appearing.  Her abdomen is soft and nontender.  Lungs clear to auscultation bilaterally.  Mucous membranes are moist.  She denies SI or HI.  She does appear slightly anxious.  Heart rate is mildly tachycardic, which seems to happen frequently at her ER visits.  Suspect this is around her baseline.  She does not look dehydrated.  She denies feeling lightheaded or dizzy.  She denies physical complaints. This BMP shows  a creatinine 1.11 which is an improvement from her baseline.  Glucose is 314 with a normal anion gap. Patient denies any physical complaints and wants to be discharged.  She denies SI or HI.  She has a follow-up appoint with her psychiatrist tomorrow.  She reports she will speak with him about this panic attack she had today.  She declines any medications in the emergency department.  She tells me she has plenty of her medications at home and will take her regular medications and insulin when she gets home. I advised the patient to follow-up with their primary care provider this week. I advised the patient to return to the emergency department with new or worsening symptoms or new concerns. The patient verbalized understanding and agreement with plan.    This patient was discussed with Dr. Laverta Baltimore who  agrees with assessment and plan.   Final Clinical Impressions(s) / ED Diagnoses   Final diagnoses:  Panic attack  Type 2 diabetes mellitus with hyperglycemia, with long-term current use of insulin ([name])  Non compliance w medication regimen    ED Discharge Orders    None       Waynetta Pean, PA-C 01/15/17 0024    Margette Fast, MD 01/15/17 [phone]

## 2017-01-14 NOTE — ED Triage Notes (Signed)
Per EMS pt was at home and started to not feel well today.  Pt states that she take good care of herself, but did not give herself insulin today.  She called EMS due to not feeling well.  AOx4 NAD noted at this time

## 2017-01-15 DIAGNOSIS — E1365 Other specified diabetes mellitus with hyperglycemia: Secondary | ICD-10-CM | POA: Diagnosis not present

## 2017-01-15 DIAGNOSIS — F99 Mental disorder, not otherwise specified: Secondary | ICD-10-CM | POA: Diagnosis not present

## 2017-01-17 ENCOUNTER — Other Ambulatory Visit: Payer: Self-pay | Admitting: Endocrinology

## 2017-01-21 DIAGNOSIS — J45909 Unspecified asthma, uncomplicated: Secondary | ICD-10-CM | POA: Diagnosis not present

## 2017-01-21 DIAGNOSIS — M6281 Muscle weakness (generalized): Secondary | ICD-10-CM | POA: Diagnosis not present

## 2017-01-21 DIAGNOSIS — M545 Low back pain: Secondary | ICD-10-CM | POA: Diagnosis not present

## 2017-01-21 DIAGNOSIS — J449 Chronic obstructive pulmonary disease, unspecified: Secondary | ICD-10-CM | POA: Diagnosis not present

## 2017-01-21 DIAGNOSIS — N184 Chronic kidney disease, stage 4 (severe): Secondary | ICD-10-CM | POA: Diagnosis not present

## 2017-01-21 DIAGNOSIS — S72002D Fracture of unspecified part of neck of left femur, subsequent encounter for closed fracture with routine healing: Secondary | ICD-10-CM | POA: Diagnosis not present

## 2017-01-21 DIAGNOSIS — J45901 Unspecified asthma with (acute) exacerbation: Secondary | ICD-10-CM | POA: Diagnosis not present

## 2017-01-30 ENCOUNTER — Telehealth: Payer: Self-pay | Admitting: Endocrinology

## 2017-02-01 DIAGNOSIS — Z79899 Other long term (current) drug therapy: Secondary | ICD-10-CM | POA: Diagnosis not present

## 2017-02-01 DIAGNOSIS — M545 Low back pain: Secondary | ICD-10-CM | POA: Diagnosis not present

## 2017-02-01 DIAGNOSIS — G8929 Other chronic pain: Secondary | ICD-10-CM | POA: Diagnosis not present

## 2017-02-04 NOTE — Telephone Encounter (Signed)
Pt has appt on 03/04/17 to see dr. She is needing all her insulin refilled.  She is wondering if he will do that before her appt in march.  [id number] - [place], [place]

## 2017-02-07 ENCOUNTER — Telehealth: Payer: Self-pay | Admitting: Endocrinology

## 2017-02-07 NOTE — Telephone Encounter (Signed)
Please advise if okay to refill for one month. I see her Nelva Nay was filled by Eulogio Bear and the dosage has been changed to 70 units. Please advise.

## 2017-02-07 NOTE — Telephone Encounter (Signed)
You may refill this at 70 units

## 2017-02-08 ENCOUNTER — Other Ambulatory Visit: Payer: Self-pay

## 2017-02-08 MED ORDER — INSULIN GLARGINE 300 UNIT/ML ~~LOC~~ SOPN: 70.0000 [IU] | PEN_INJECTOR | Freq: Two times a day (BID) | SUBCUTANEOUS | 1 refills | Status: DC

## 2017-02-08 MED ORDER — INSULIN REGULAR HUMAN (CONC) 500 UNIT/ML ~~LOC~~ SOPN: PEN_INJECTOR | SUBCUTANEOUS | 0 refills | Status: DC

## 2017-02-08 NOTE — Telephone Encounter (Signed)
Yes

## 2017-02-08 NOTE — Telephone Encounter (Signed)
Please advise if okay to refill meds with a note to make an appointment? I will need to change the Toujeo to 65 units per day per your last OV note.

## 2017-02-08 NOTE — Telephone Encounter (Signed)
Refill submitted. 

## 2017-02-08 NOTE — Telephone Encounter (Signed)
Refills submitted.  

## 2017-02-11 ENCOUNTER — Telehealth: Payer: Self-pay | Admitting: Endocrinology

## 2017-02-11 ENCOUNTER — Other Ambulatory Visit: Payer: Self-pay

## 2017-02-11 MED ORDER — INSULIN GLARGINE 300 UNIT/ML ~~LOC~~ SOPN: 65.0000 [IU] | PEN_INJECTOR | Freq: Every day | SUBCUTANEOUS | 2 refills | Status: DC

## 2017-02-11 MED ORDER — LIRAGLUTIDE 18 MG/3ML ~~LOC~~ SOPN: PEN_INJECTOR | SUBCUTANEOUS | 2 refills | Status: DC

## 2017-02-11 MED ORDER — INSULIN REGULAR HUMAN (CONC) 500 UNIT/ML ~~LOC~~ SOPN: PEN_INJECTOR | SUBCUTANEOUS | 2 refills | Status: DC

## 2017-02-11 NOTE — Telephone Encounter (Signed)
Patient need a refill of all insulins, victoza, humulin R, 500 toujeo [facility] [phone]

## 2017-02-11 NOTE — Telephone Encounter (Signed)
These medications have been sent to the pharmacy for the patient. 

## 2017-02-13 DIAGNOSIS — F411 Generalized anxiety disorder: Secondary | ICD-10-CM | POA: Diagnosis not present

## 2017-02-21 DIAGNOSIS — N184 Chronic kidney disease, stage 4 (severe): Secondary | ICD-10-CM | POA: Diagnosis not present

## 2017-02-21 DIAGNOSIS — M545 Low back pain: Secondary | ICD-10-CM | POA: Diagnosis not present

## 2017-02-21 DIAGNOSIS — J45901 Unspecified asthma with (acute) exacerbation: Secondary | ICD-10-CM | POA: Diagnosis not present

## 2017-02-21 DIAGNOSIS — S72002D Fracture of unspecified part of neck of left femur, subsequent encounter for closed fracture with routine healing: Secondary | ICD-10-CM | POA: Diagnosis not present

## 2017-02-21 DIAGNOSIS — J449 Chronic obstructive pulmonary disease, unspecified: Secondary | ICD-10-CM | POA: Diagnosis not present

## 2017-02-21 DIAGNOSIS — M6281 Muscle weakness (generalized): Secondary | ICD-10-CM | POA: Diagnosis not present

## 2017-02-21 DIAGNOSIS — J45909 Unspecified asthma, uncomplicated: Secondary | ICD-10-CM | POA: Diagnosis not present

## 2017-02-23 ENCOUNTER — Encounter (HOSPITAL_COMMUNITY): Payer: Self-pay

## 2017-02-23 ENCOUNTER — Emergency Department (HOSPITAL_COMMUNITY): Payer: Medicare HMO

## 2017-02-23 ENCOUNTER — Inpatient Hospital Stay (HOSPITAL_COMMUNITY)
Admission: EM | Admit: 2017-02-23 | Discharge: 2017-02-25 | DRG: 190 | Disposition: A | Discharge status: Home / Self Care | Payer: Medicare HMO | Attending: Internal Medicine | Admitting: Internal Medicine

## 2017-02-23 DIAGNOSIS — B20 Human immunodeficiency virus [HIV] disease: Secondary | ICD-10-CM | POA: Diagnosis not present

## 2017-02-23 DIAGNOSIS — M810 Age-related osteoporosis without current pathological fracture: Secondary | ICD-10-CM | POA: Diagnosis present

## 2017-02-23 DIAGNOSIS — J441 Chronic obstructive pulmonary disease with (acute) exacerbation: Secondary | ICD-10-CM | POA: Diagnosis not present

## 2017-02-23 DIAGNOSIS — I959 Hypotension, unspecified: Secondary | ICD-10-CM

## 2017-02-23 DIAGNOSIS — E785 Hyperlipidemia, unspecified: Secondary | ICD-10-CM | POA: Diagnosis present

## 2017-02-23 DIAGNOSIS — J9621 Acute and chronic respiratory failure with hypoxia: Secondary | ICD-10-CM | POA: Diagnosis present

## 2017-02-23 DIAGNOSIS — Z7982 Long term (current) use of aspirin: Secondary | ICD-10-CM

## 2017-02-23 DIAGNOSIS — Z794 Long term (current) use of insulin: Secondary | ICD-10-CM

## 2017-02-23 DIAGNOSIS — G8929 Other chronic pain: Secondary | ICD-10-CM | POA: Diagnosis present

## 2017-02-23 DIAGNOSIS — Z8249 Family history of ischemic heart disease and other diseases of the circulatory system: Secondary | ICD-10-CM | POA: Diagnosis not present

## 2017-02-23 DIAGNOSIS — E1151 Type 2 diabetes mellitus with diabetic peripheral angiopathy without gangrene: Secondary | ICD-10-CM | POA: Diagnosis not present

## 2017-02-23 DIAGNOSIS — Z89512 Acquired absence of left leg below knee: Secondary | ICD-10-CM | POA: Diagnosis not present

## 2017-02-23 DIAGNOSIS — Z66 Do not resuscitate: Secondary | ICD-10-CM | POA: Diagnosis present

## 2017-02-23 DIAGNOSIS — R000 Tachycardia, unspecified: Secondary | ICD-10-CM | POA: Diagnosis not present

## 2017-02-23 DIAGNOSIS — Z821 Family history of blindness and visual loss: Secondary | ICD-10-CM | POA: Diagnosis not present

## 2017-02-23 DIAGNOSIS — N184 Chronic kidney disease, stage 4 (severe): Secondary | ICD-10-CM | POA: Diagnosis present

## 2017-02-23 DIAGNOSIS — J80 Acute respiratory distress syndrome: Secondary | ICD-10-CM | POA: Diagnosis not present

## 2017-02-23 DIAGNOSIS — Z833 Family history of diabetes mellitus: Secondary | ICD-10-CM

## 2017-02-23 DIAGNOSIS — J9601 Acute respiratory failure with hypoxia: Secondary | ICD-10-CM | POA: Diagnosis not present

## 2017-02-23 DIAGNOSIS — K219 Gastro-esophageal reflux disease without esophagitis: Secondary | ICD-10-CM | POA: Diagnosis not present

## 2017-02-23 DIAGNOSIS — J3489 Other specified disorders of nose and nasal sinuses: Secondary | ICD-10-CM | POA: Diagnosis not present

## 2017-02-23 DIAGNOSIS — F329 Major depressive disorder, single episode, unspecified: Secondary | ICD-10-CM | POA: Diagnosis not present

## 2017-02-23 DIAGNOSIS — I1310 Hypertensive heart and chronic kidney disease without heart failure, with stage 1 through stage 4 chronic kidney disease, or unspecified chronic kidney disease: Secondary | ICD-10-CM | POA: Diagnosis present

## 2017-02-23 DIAGNOSIS — I252 Old myocardial infarction: Secondary | ICD-10-CM | POA: Diagnosis not present

## 2017-02-23 DIAGNOSIS — R0602 Shortness of breath: Secondary | ICD-10-CM | POA: Diagnosis not present

## 2017-02-23 DIAGNOSIS — Z888 Allergy status to other drugs, medicaments and biological substances status: Secondary | ICD-10-CM

## 2017-02-23 DIAGNOSIS — R4182 Altered mental status, unspecified: Secondary | ICD-10-CM | POA: Diagnosis not present

## 2017-02-23 DIAGNOSIS — E1140 Type 2 diabetes mellitus with diabetic neuropathy, unspecified: Secondary | ICD-10-CM | POA: Diagnosis present

## 2017-02-23 DIAGNOSIS — F17210 Nicotine dependence, cigarettes, uncomplicated: Secondary | ICD-10-CM | POA: Diagnosis present

## 2017-02-23 DIAGNOSIS — J069 Acute upper respiratory infection, unspecified: Secondary | ICD-10-CM | POA: Diagnosis present

## 2017-02-23 DIAGNOSIS — F419 Anxiety disorder, unspecified: Secondary | ICD-10-CM | POA: Diagnosis present

## 2017-02-23 DIAGNOSIS — E1122 Type 2 diabetes mellitus with diabetic chronic kidney disease: Secondary | ICD-10-CM | POA: Diagnosis present

## 2017-02-23 LAB — CBC WITH DIFFERENTIAL/PLATELET
BASOS ABS: 0.1 10*3/uL (ref 0.0–0.1)
Basophils Relative: 0 %
EOS ABS: 0.1 10*3/uL (ref 0.0–0.7)
Eosinophils Relative: 1 %
HCT: 42 % (ref 36.0–46.0)
HEMOGLOBIN: 13 g/dL (ref 12.0–15.0)
LYMPHS PCT: 7 %
Lymphs Abs: 1.6 10*3/uL (ref 0.7–4.0)
MCH: 28.4 pg (ref 26.0–34.0)
MCHC: 31 g/dL (ref 30.0–36.0)
MCV: 91.7 fL (ref 78.0–100.0)
Monocytes Absolute: 1.1 10*3/uL — ABNORMAL HIGH (ref 0.1–1.0)
Monocytes Relative: 5 %
NEUTROS PCT: 87 %
Neutro Abs: 19.2 10*3/uL — ABNORMAL HIGH (ref 1.7–7.7)
PLATELETS: 316 10*3/uL (ref 150–400)
RBC: 4.58 MIL/uL (ref 3.87–5.11)
RDW: 20.6 % — ABNORMAL HIGH (ref 11.5–15.5)
WBC: 22 10*3/uL — AB (ref 4.0–10.5)

## 2017-02-23 LAB — I-STAT VENOUS BLOOD GAS, ED
Bicarbonate: 27.9 mmol/L (ref 20.0–28.0)
O2 Saturation: 89 %
TCO2: 30 mmol/L (ref 22–32)
pCO2, Ven: 56.5 mmHg (ref 44.0–60.0)
pH, Ven: 7.302 (ref 7.250–7.430)
pO2, Ven: 63 mmHg — ABNORMAL HIGH (ref 32.0–45.0)

## 2017-02-23 LAB — COMPREHENSIVE METABOLIC PANEL
ALK PHOS: 105 U/L (ref 38–126)
ALT: 27 U/L (ref 14–54)
ANION GAP: 15 (ref 5–15)
AST: 30 U/L (ref 15–41)
Albumin: 3.2 g/dL — ABNORMAL LOW (ref 3.5–5.0)
BUN: 23 mg/dL — AB (ref 6–20)
CHLORIDE: 99 mmol/L — AB (ref 101–111)
CO2: 26 mmol/L (ref 22–32)
Calcium: 9.6 mg/dL (ref 8.9–10.3)
Creatinine, Ser: 1.35 mg/dL — ABNORMAL HIGH (ref 0.44–1.00)
GFR calc Af Amer: 51 mL/min — ABNORMAL LOW (ref >60)
GFR calc non Af Amer: 44 mL/min — ABNORMAL LOW (ref >60)
Glucose, Bld: 176 mg/dL — ABNORMAL HIGH (ref 65–99)
Potassium: 4.2 mmol/L (ref 3.5–5.1)
SODIUM: 140 mmol/L (ref 135–145)
TOTAL PROTEIN: 6.8 g/dL (ref 6.5–8.1)
Total Bilirubin: 0.7 mg/dL (ref 0.3–1.2)

## 2017-02-23 LAB — I-STAT TROPONIN, ED: TROPONIN I, POC: 0.05 ng/mL (ref 0.00–0.08)

## 2017-02-23 LAB — CBG MONITORING, ED: Glucose-Capillary: 266 mg/dL — ABNORMAL HIGH (ref 65–99)

## 2017-02-23 LAB — BRAIN NATRIURETIC PEPTIDE: B NATRIURETIC PEPTIDE 5: 722.2 pg/mL — AB (ref 0.0–100.0)

## 2017-02-23 LAB — I-STAT CG4 LACTIC ACID, ED: LACTIC ACID, VENOUS: 1.26 mmol/L (ref 0.5–1.9)

## 2017-02-23 MED ORDER — ROSUVASTATIN CALCIUM 20 MG PO TABS
20.0000 mg | ORAL_TABLET | Freq: Every day | ORAL | Status: DC
  Administered 2017-02-24 – 2017-02-25 (×2): 20 mg via ORAL
  Filled 2017-02-23: qty 2
  Filled 2017-02-23 (×2): qty 1
  Filled 2017-02-23: qty 2

## 2017-02-23 MED ORDER — ONDANSETRON HCL 4 MG/2ML IJ SOLN: 4.0000 mg | Freq: Four times a day (QID) | INTRAMUSCULAR | Status: DC | PRN

## 2017-02-23 MED ORDER — SODIUM CHLORIDE 0.9 % IV BOLUS (SEPSIS)
500.0000 mL | Freq: Once | INTRAVENOUS | Status: AC
  Administered 2017-02-24: 500 mL via INTRAVENOUS

## 2017-02-23 MED ORDER — METHYLPREDNISOLONE SODIUM SUCC 125 MG IJ SOLR
125.0000 mg | Freq: Two times a day (BID) | INTRAMUSCULAR | Status: DC
  Administered 2017-02-24 – 2017-02-25 (×3): 125 mg via INTRAVENOUS
  Filled 2017-02-23 (×3): qty 2

## 2017-02-23 MED ORDER — SODIUM CHLORIDE 0.9 % IV BOLUS (SEPSIS)
1000.0000 mL | Freq: Once | INTRAVENOUS | Status: AC
  Administered 2017-02-23: 1000 mL via INTRAVENOUS

## 2017-02-23 MED ORDER — FENTANYL CITRATE (PF) 100 MCG/2ML IJ SOLN
50.0000 ug | Freq: Once | INTRAMUSCULAR | Status: AC
  Administered 2017-02-23: 50 ug via INTRAVENOUS
  Filled 2017-02-23: qty 2

## 2017-02-23 MED ORDER — INSULIN GLARGINE 100 UNIT/ML ~~LOC~~ SOLN
35.0000 [IU] | Freq: Every day | SUBCUTANEOUS | Status: DC
  Administered 2017-02-24: 35 [IU] via SUBCUTANEOUS
  Filled 2017-02-23: qty 0.35

## 2017-02-23 MED ORDER — ARIPIPRAZOLE 5 MG PO TABS
10.0000 mg | ORAL_TABLET | Freq: Every day | ORAL | Status: DC
  Administered 2017-02-24 – 2017-02-25 (×2): 10 mg via ORAL
  Filled 2017-02-23 (×2): qty 2

## 2017-02-23 MED ORDER — DULOXETINE HCL 60 MG PO CPEP
60.0000 mg | ORAL_CAPSULE | Freq: Every day | ORAL | Status: DC
  Administered 2017-02-24 – 2017-02-25 (×2): 60 mg via ORAL
  Filled 2017-02-23 (×2): qty 1

## 2017-02-23 MED ORDER — SODIUM CHLORIDE 0.9 % IV SOLN
1.0000 g | INTRAVENOUS | Status: DC
  Administered 2017-02-24: 1 g via INTRAVENOUS
  Filled 2017-02-23 (×2): qty 10

## 2017-02-23 MED ORDER — LORAZEPAM 2 MG/ML IJ SOLN
0.5000 mg | Freq: Once | INTRAMUSCULAR | Status: AC
  Administered 2017-02-23: 0.5 mg via INTRAVENOUS
  Filled 2017-02-23: qty 1

## 2017-02-23 MED ORDER — BUDESONIDE 0.25 MG/2ML IN SUSP
0.2500 mg | Freq: Two times a day (BID) | RESPIRATORY_TRACT | Status: DC
  Administered 2017-02-24 – 2017-02-25 (×3): 0.25 mg via RESPIRATORY_TRACT
  Filled 2017-02-23 (×3): qty 2

## 2017-02-23 MED ORDER — SODIUM CHLORIDE 0.9 % IV SOLN
INTRAVENOUS | Status: DC
  Administered 2017-02-24: 05:00:00 via INTRAVENOUS
  Administered 2017-02-24: 500 mL via INTRAVENOUS
  Administered 2017-02-24 – 2017-02-25 (×2): via INTRAVENOUS

## 2017-02-23 MED ORDER — ALBUTEROL (5 MG/ML) CONTINUOUS INHALATION SOLN
10.0000 mg/h | INHALATION_SOLUTION | RESPIRATORY_TRACT | Status: DC
  Administered 2017-02-23: 10 mg/h via RESPIRATORY_TRACT
  Filled 2017-02-23: qty 20

## 2017-02-23 MED ORDER — ASPIRIN EC 81 MG PO TBEC
81.0000 mg | DELAYED_RELEASE_TABLET | Freq: Every day | ORAL | Status: DC
  Administered 2017-02-24 – 2017-02-25 (×2): 81 mg via ORAL
  Filled 2017-02-23 (×2): qty 1

## 2017-02-23 MED ORDER — HEPARIN SODIUM (PORCINE) 5000 UNIT/ML IJ SOLN
5000.0000 [IU] | Freq: Three times a day (TID) | INTRAMUSCULAR | Status: DC
  Administered 2017-02-24 – 2017-02-25 (×5): 5000 [IU] via SUBCUTANEOUS
  Filled 2017-02-23 (×5): qty 1

## 2017-02-23 MED ORDER — ACETAMINOPHEN 650 MG RE SUPP: 650.0000 mg | Freq: Four times a day (QID) | RECTAL | Status: DC | PRN

## 2017-02-23 MED ORDER — METHYLPREDNISOLONE SODIUM SUCC 125 MG IJ SOLR
125.0000 mg | Freq: Once | INTRAMUSCULAR | Status: AC
Start: 2017-02-23 — End: 2017-02-23
  Administered 2017-02-23: 125 mg via INTRAVENOUS
  Filled 2017-02-23: qty 2

## 2017-02-23 MED ORDER — INSULIN ASPART 100 UNIT/ML ~~LOC~~ SOLN
0.0000 [IU] | Freq: Three times a day (TID) | SUBCUTANEOUS | Status: DC
  Administered 2017-02-24: 11 [IU] via SUBCUTANEOUS
  Administered 2017-02-24: 15 [IU] via SUBCUTANEOUS
  Administered 2017-02-24: 5 [IU] via SUBCUTANEOUS
  Administered 2017-02-25 (×2): 15 [IU] via SUBCUTANEOUS

## 2017-02-23 MED ORDER — SODIUM CHLORIDE 0.9 % IV SOLN
500.0000 mg | Freq: Once | INTRAVENOUS | Status: AC
  Administered 2017-02-23: 500 mg via INTRAVENOUS
  Filled 2017-02-23: qty 500

## 2017-02-23 MED ORDER — PANTOPRAZOLE SODIUM 40 MG PO TBEC
40.0000 mg | DELAYED_RELEASE_TABLET | Freq: Every day | ORAL | Status: DC
  Administered 2017-02-24 – 2017-02-25 (×2): 40 mg via ORAL
  Filled 2017-02-23 (×2): qty 1

## 2017-02-23 MED ORDER — FENTANYL CITRATE (PF) 100 MCG/2ML IJ SOLN
100.0000 ug | Freq: Once | INTRAMUSCULAR | Status: AC
  Administered 2017-02-24: 100 ug via INTRAVENOUS
  Filled 2017-02-23: qty 2

## 2017-02-23 MED ORDER — FENOFIBRATE 160 MG PO TABS
160.0000 mg | ORAL_TABLET | Freq: Every day | ORAL | Status: DC
  Administered 2017-02-24 – 2017-02-25 (×2): 160 mg via ORAL
  Filled 2017-02-23 (×2): qty 1

## 2017-02-23 MED ORDER — SODIUM CHLORIDE 0.9 % IV SOLN
1.0000 g | Freq: Once | INTRAVENOUS | Status: AC
  Administered 2017-02-23: 1 g via INTRAVENOUS
  Filled 2017-02-23: qty 10

## 2017-02-23 MED ORDER — ONDANSETRON HCL 4 MG PO TABS
4.0000 mg | ORAL_TABLET | Freq: Four times a day (QID) | ORAL | Status: DC | PRN
  Administered 2017-02-24: 4 mg via ORAL
  Filled 2017-02-23: qty 1

## 2017-02-23 MED ORDER — IPRATROPIUM BROMIDE 0.02 % IN SOLN
1.0000 mg | Freq: Once | RESPIRATORY_TRACT | Status: AC
  Administered 2017-02-23: 1 mg via RESPIRATORY_TRACT
  Filled 2017-02-23: qty 5

## 2017-02-23 MED ORDER — ACETAMINOPHEN 325 MG PO TABS
650.0000 mg | ORAL_TABLET | Freq: Four times a day (QID) | ORAL | Status: DC | PRN
  Administered 2017-02-24: 650 mg via ORAL
  Filled 2017-02-23: qty 2

## 2017-02-23 NOTE — ED Triage Notes (Signed)
Patient arrived by [facility] for increased SOB x 1 day. Cough and no relief with inhaler. Patient received alb 5mg  and atrovent .5 pta.  Arrived on CPAP and sats improved to 96%. Patient denies pain. Alert and oriented Bp146/116 P-118 resp 30 Sat 82 r/a CBG 173

## 2017-02-23 NOTE — ED Notes (Signed)
Pt c/o back pain

## 2017-02-23 NOTE — ED Notes (Signed)
Dr Dayna Barker back in to see  Nasal 02 at 5 liters  Pain med given

## 2017-02-23 NOTE — ED Notes (Signed)
Pt state her bp generally is low but does not feel dizzy or lightheaded, pt is alert and ox4.

## 2017-02-23 NOTE — Progress Notes (Signed)
Pt taken off Bipap per MD. Placed on  3L [place] per home use. SPO2 94%, RR 20-22.

## 2017-02-23 NOTE — ED Notes (Signed)
2 sets on Blood cultures collected. MD informed they were drawn if he wishes to order.

## 2017-02-23 NOTE — ED Notes (Signed)
The pt was getting out of bed and maybe the pushing and pulling caught her rt hand iv  And it pulled loose  Blood all over the bed  From the iv.  Minutes later the  Doctor at the bedside said her rt a-c iv was out  ?? No idea what happened

## 2017-02-23 NOTE — Progress Notes (Signed)
This note also relates to the following rows which could not be included: SpO2 - Cannot attach notes to unvalidated device data  Placed patient on BiPAP per order.

## 2017-02-23 NOTE — H&P (Addendum)
[facility] History and Physical  [name] DXA:[id number] DOB: 11-Dec-1965 DOA: 02/23/2017  Referring physician: PCP: Michel Harrow, PA-C   Chief Complaint: "I couldn't breath."  HPI: [name] is a 52 y.o. female with past medical history significant for CKD 4, depression, diabetes, anxiety, chronic respiratory failure and asthma to the hospital with acute shortness of breath.  Patient admits to still smoking and having multiple sick contacts with flulike symptoms.  At home she tried to treat herself with her nebulizer multiple times before coming to the hospital.  This was ineffective.  Patient activated EMS and received treatments in route without any relief.  Patient states she is compliant with all her medications.  She has had a nonproductive hacking cough.  History limited because patient states she is too tired and short of breath to give extensive history.  Course: Patient started on azithromycin and Rocephin.  Given multiple nebulizer treatments.  Patient was able to be weaned from BiPAP to 5 L nasal cannula.  His blood pressure was noticed to be soft but patient had received narcotics.  Patient was bolused to have liters of IV fluid in the ED.  Hospitalist consulted for admission.   Review of Systems:  As per HPI otherwise 10 point review of systems negative.    Past Medical History:  Diagnosis Date  . Acute metabolic encephalopathy 02/10/4707  . Acute on chronic respiratory failure with hypoxia ([place]) 05/19/2016  . Anxiety   . ARF (acute renal failure) ([place]) 05/03/2014  . Arthritis    LEFT HIP  . Asthma    HOSPITALIZED WITH EXCERBATION OF ASTHMA - AND BRONCHITIS AND THE FLU DEC 2013  . Asthma exacerbation attacks 12/28/2011  . Bronchitis 12/28/2011  . CAP (community acquired pneumonia) 07/25/2012  . Chronic kidney disease (CKD), stage IV (severe) ([place]) 11/10/2014  . Class 3 obesity due to excess calories with serious comorbidity and body mass index  (BMI) of 40.0 to 44.9 in adult   . Colles' fracture of left radius   . Depression   . Diabetes mellitus    ON INSULIN AND ORAL MEDICATIONS  . Diabetes mellitus type 2 with complications, uncontrolled ([place]) 04/19/2008   Qualifier: Diagnosis of  By: Tomma Lightning MD, Claiborne Billings     . Diabetic foot infection ([place]) 11/09/2014  . Diabetic ketoacidosis without coma associated with type 2 diabetes mellitus ([place])   . Diabetic neuropathy ([place]) 11/10/2014  . Diarrhea 08/30/2008   Qualifier: Diagnosis of  By: Tomma Lightning MD, Claiborne Billings    . Diastolic dysfunction 62/08/3660  . Difficult intravenous access   . DKA, type 2 ([place]) 04/04/2016  . Dyslipidemia 12/23/2008   Qualifier: Diagnosis of  By: Tomma Lightning MD, Claiborne Billings    . Essential hypertension 04/19/2008   Qualifier: Diagnosis of  By: Tomma Lightning MD, Claiborne Billings    . FATIGUE 07/12/2008   Qualifier: Diagnosis of  By: Tomma Lightning MD, Claiborne Billings    . Femoral neck fracture ([place]) 07/16/2011  . Femur fracture, left ([place]) 07/08/2014  . Fever   . Fracture of distal femur ([place]) 07/08/2014  . GERD (gastroesophageal reflux disease)   . Hereditary and idiopathic peripheral neuropathy 04/19/2008   Qualifier: Diagnosis of  By: Tomma Lightning MD, Claiborne Billings    . Hip fracture, left ([place]) 07/16/2011  . Hip pain   . HIV infection ([place]) 2000  . Human immunodeficiency virus (HIV) disease ([place]) 04/19/2008   [id number] +   . Hyperlipidemia   . Hypertension   . Hyponatremia 12/29/2011  . Influenza A 12/29/2011  .  Influenza B   . Insomnia 06/12/2016  . Left hip pain 05/03/2014  . Mood disorder ([place]) 04/19/2008   Qualifier: Diagnosis of  By: Tomma Lightning MD, Claiborne Billings    . Neuropathy    NEUROPATHY HANDS AND FEET  . NSTEMI (non-ST elevated myocardial infarction) ([place]) 05/19/2016  . Obesity hypoventilation syndrome ([place])   . Osteoporosis 07/08/2014  . Pain    SEVERE PAIN LEFT HIP - HX OF LEFT HIP PINNING JULY 2013  . Perimenopausal symptoms 02/13/2012   LMP around 2011. On estrace and provera since around 2012 for hot flashes, mood swings.  Estrace 2 mg daily, Provera 2.5 mg for 5 days each month.   . Repeated falls 07/30/2011  . Sepsis ([place]) 04/04/2016  . Shortness of breath    ALLERGIES ARE "ACTING UP"  . SOB (shortness of breath)   . THRUSH 05/20/2008   Qualifier: Diagnosis of  By: Tomma Lightning MD, Claiborne Billings      . Tobacco use disorder 04/14/2010   Past Surgical History:  Procedure Laterality Date  . AMPUTATION Left 11/18/2014   Procedure: LEFT BELOW KNEE AMPUTATION;  Surgeon: Leandrew Koyanagi, MD;  Location: [facility];  Service: Orthopedics;  Laterality: Left;  . CAST APPLICATION Left 06/09/1155   Procedure: CAST APPLICATION (FIBERGLASS);  Surgeon: Latanya Maudlin, MD;  Location: [facility];  Service: Orthopedics;  Laterality: Left;  CLOSED REDUCTION OF LEFT COLLES FRACTURE WITH SHORT ARM CAST  . HARDWARE REMOVAL Left 05/05/2014   Procedure: HARDWARE REMOVAL LEFT HIP;  Surgeon: Latanya Maudlin, MD;  Location: [facility];  Service: Orthopedics;  Laterality: Left;  REMOVAL BIOMET 6.5-8.0 CANNULATED SCREW  . HIP ARTHROPLASTY Left 05/05/2014   Procedure: ARTHROPLASTY OPEN REDUCTION INTERNAL FIXATION LEFT HIP AND REMOVAL OF TWO CANNULATED SCREW;  Surgeon: Latanya Maudlin, MD;  Location[facility];  Service: Orthopedics;  Laterality: Left;  . HIP PINNING,CANNULATED  07/16/2011   Procedure: CANNULATED HIP PINNING;  Surgeon: Gearlean Alf, MD;  Location: [facility];  Service: Orthopedics;  Laterality: Left;  . HIP PINNING,CANNULATED Left 04/09/2012   Procedure: CANNULATED HIP PINNING AND HARDWARE REVISION;  Surgeon: Gearlean Alf, MD;  Location: [facility];  Service: Orthopedics;  Laterality: Left;  . I&D EXTREMITY Left 11/16/2014   Procedure:  DEBRIDEMENT OF LEFT FOOT POSSIBLE APPLICATION OF INTEGRIA AND VAC ;  Surgeon: Irene Limbo, MD;  Location: [facility];  Service: Plastics;  Laterality: Left;  . PERIPHERAL VASCULAR CATHETERIZATION N/A 11/15/2014   Procedure: Abdominal Aortogram;  Surgeon: Conrad Newark, MD;  Location: [facility];  Service: Cardiovascular;   Laterality: N/A;  . PERIPHERAL VASCULAR CATHETERIZATION  11/15/2014   Procedure: Lower Extremity Angiography;  Surgeon: Conrad Notus, MD;  Location: [facility];  Service: Cardiovascular;;  . PERIPHERAL VASCULAR CATHETERIZATION Left 11/15/2014   Procedure: Peripheral Vascular Intervention;  Surgeon: Conrad [name], MD;  Location: [facility];  Service: Cardiovascular;  Laterality: Left;  popliteal artery stenting   Social History:  reports that she has been smoking e-cigarettes and cigarettes.  She has a 17.50 pack-year smoking history. she has never used smokeless tobacco. She reports that she does not drink alcohol or use drugs.  Allergies  Allergen Reactions  . Cortizone-10 [Hydrocortisone] Rash    Per patient, given local injection at knee and developed rash at local site.     Family History  Problem Relation Age of Onset  . Diabetes Mother   . Hypertension Mother   . Vision loss Mother   . Heart disease  Mother   . Hypertension Father   . Thyroid disease Neg Hx      Prior to Admission medications   Medication Sig Start Date End Date Taking? Authorizing Provider  albuterol (PROAIR HFA) 108 (90 Base) MCG/ACT inhaler Inhale 2 puffs into the lungs every 6 (six) hours as needed for wheezing or shortness of breath. 11/28/16  Yes Chesley Mires, MD  ARIPiprazole (ABILIFY) 10 MG tablet Take 10 mg by mouth every morning. 02/13/17  Yes [provider]  clonazePAM (KLONOPIN) 1 MG tablet Take 1 mg by mouth 3 (three) times daily as needed for anxiety. 02/13/17  Yes [provider]  doxepin (SINEQUAN) 10 MG capsule Take 20 mg by mouth at bedtime. 02/13/17  Yes [provider]  DULoxetine (CYMBALTA) 60 MG capsule Take 60 mg by mouth daily. 08/16/16  Yes [provider]  fenofibrate (TRICOR) 145 MG tablet Take 145 mg by mouth daily.   Yes [provider]  fluticasone furoate-vilanterol (BREO ELLIPTA) 100-25 MCG/INH AEPB Inhale 1 puff into the  lungs daily. 11/28/16  Yes Chesley Mires, MD  furosemide (LASIX) 80 MG tablet Take 80 mg by mouth daily. 02/10/17  Yes [provider]  GENVOYA 150-150-200-10 MG TABS tablet TAKE 1 TABLET BY MOUTH EVERY DAY 12/19/15  Yes Campbell Riches, MD  HYDROcodone-acetaminophen (NORCO) 10-325 MG tablet Take 1 tablet by mouth every 8 (eight) hours as needed (pain).   Yes [provider]  Insulin Glargine (TOUJEO SOLOSTAR) 300 UNIT/ML SOPN Inject 65 Units into the skin daily. 02/11/17  Yes Elayne Snare, MD  insulin regular human CONCENTRATED (HUMULIN R U-500 KWIKPEN) 500 UNIT/ML kwikpen INJECT 60 UNITS BEFORE BREAKFAST, 50 UNITS BEFORE LUNCH, AND 35 UNITS BEFORE DINNER. 02/11/17  Yes Elayne Snare, MD  liraglutide (VICTOZA) 18 MG/3ML SOPN INJECT 1.8 MG INTO THE SKIN DAILY 02/11/17  Yes Elayne Snare, MD  [facility] 4 MG/0.1ML LIQD nasal spray kit Place 1 spray into the nose as needed. overdose 02/01/17  Yes [provider]  pantoprazole (PROTONIX) 40 MG tablet Take 40 mg by mouth daily. 09/02/14  Yes [provider]  potassium chloride SA (K-DUR,KLOR-CON) 20 MEQ tablet Take 1 tablet (20 mEq total) by mouth daily. X 3 days, then check potassium levels if needed to continue Patient taking differently: Take 20 mEq by mouth daily.  05/25/16  Yes Rai, Ripudeep K, MD  pregabalin (LYRICA) 225 MG capsule Take 225 mg by mouth 2 (two) times daily.   Yes [provider]  rosuvastatin (CRESTOR) 20 MG tablet Take 20 mg by mouth daily. Reported on 05/02/2015 09/02/14  Yes [provider]  temazepam (RESTORIL) 15 MG capsule Take 15 mg by mouth at bedtime as needed for sleep. 02/13/17  Yes [provider]  tiZANidine (ZANAFLEX) 4 MG tablet Take 4 mg by mouth 2 (two) times daily.  03/06/16  Yes [provider]  ACCU-CHEK AVIVA PLUS test strip TEST BLOOD SUGAR THREE TIMES DAILY AS DIRECTED 11/28/16   Elayne Snare, MD  aspirin EC 81 MG EC tablet Take 1 tablet (81 mg total) by mouth  daily. Patient taking differently: Take 81 mg by mouth.  05/25/16   Rai, Ripudeep K, MD  Blood Glucose Monitoring Suppl (ACCU-CHEK AVIVA PLUS) w/Device KIT USE TO TEST BLOOD SUGAR DAILY 07/03/16   Elayne Snare, MD  diphenoxylate-atropine (LOMOTIL) 2.5-0.025 MG tablet Take 1 tablet by mouth 4 (four) times daily as needed for diarrhea or loose stools. 11/20/16   Campbell Riches, MD  Physical Exam: Vitals:   02/23/17 2045 02/23/17 2100 02/23/17 2201 02/23/17 2214  BP: 98/86 (!) 71/59 (!) 81/52 103/89  Pulse: (!) 104 (!) 104 (!) 101 100  Resp: 16 (!) 21 20 16   Temp:      TempSrc:      SpO2: 94% 93% 95% 94%    Wt Readings from Last 3 Encounters:  01/14/17 107 kg (236 lb)  01/09/17 107 kg (236 lb)  11/28/16 107 kg (236 lb)    General:  Appears calm and comfortable; A&Ox3, mildly diaphoretic, tremulous, nervous Eyes:  PERRL, EOMI, normal lids, iris ENT:  grossly normal hearing, lips & tongue Neck:  no LAD, masses or thyromegaly Cardiovascular:  RRR, no m/r/g. No LE edema.  Respiratory:  Wheeze, diffuse course, incr wob Abdomen:  soft, ntnd Skin:  no rash or induration seen on limited exam Musculoskeletal:  grossly normal tone BUE/BLE Psychiatric:  grossly normal mood and affect, speech fluent and appropriate Neurologic:  CN 2-12 grossly intact, moves all extremities in coordinated fashion.          Labs on Admission:  Basic Metabolic Panel: Recent Labs  Lab 02/23/17 1736  NA 140  K 4.2  CL 99*  CO2 26  GLUCOSE 176*  BUN 23*  CREATININE 1.35*  CALCIUM 9.6   Liver Function Tests: Recent Labs  Lab 02/23/17 1736  AST 30  ALT 27  ALKPHOS 105  BILITOT 0.7  PROT 6.8  ALBUMIN 3.2*   No results for input(s): LIPASE, AMYLASE in the last 168 hours. No results for input(s): AMMONIA in the last 168 hours. CBC: Recent Labs  Lab 02/23/17 1736  WBC 22.0*  NEUTROABS 19.2*  HGB 13.0  HCT 42.0  MCV 91.7  PLT 316   Cardiac Enzymes: No results for input(s): CKTOTAL,  CKMB, CKMBINDEX, TROPONINI in the last 168 hours.  BNP (last 3 results) Recent Labs    06/18/16 1636 08/19/16 0827 02/23/17 1736  BNP 592.7* 65.5 722.2*    ProBNP (last 3 results) No results for input(s): PROBNP in the last 8760 hours.   Creatinine clearance cannot be calculated (Unknown ideal weight.)  CBG: No results for input(s): GLUCAP in the last 168 hours.  Radiological Exams on Admission: Dg Chest Port 1 View  Result Date: 02/23/2017 CLINICAL DATA:  Shortness of breath EXAM: PORTABLE CHEST 1 VIEW COMPARISON:  Chest radiograph 09/03/2016 FINDINGS: Unchanged mild cardiomegaly. Multiple old right-sided rib fractures. No focal consolidation or pulmonary edema. No pleural effusion. IMPRESSION: Unchanged cardiomegaly without focal airspace disease. Electronically Signed   By: Ulyses Jarred M.D.   On: 02/23/2017 18:08    EKG: Independently reviewed. Sinus tachycardia, no stemi.  Assessment/Plan Active Problems:   COPD exacerbation ([name])  COPD AE Scheduled DuoNeb's & pulmicort When necessary albuterol Antibiotic- azithro and rocephin; will cont rocephin only Oxygen therapy Continuous pulse oximetry [place] IV solumedrol Prn bipap CXR clear Blood cx x2 done 2/23  Low BP IVF overnight Pt against pressors  I&O May be from narcs given in ED Hold lasix  CKD Monitor Cr daily On admit 1.35 Cr at baseline,  1.1-1.2  HIV Off meds  DM  SSI AC [place] 65u-->35u qd Hold Victoza  Depression Cont abilify, cymbalat, restoril Hold klonopin, doxepin  HLD Cont fenofibrate & crestor  GERD Cont PPI  Chronic pain Hold lyrica and norco & zanaflex   Code Status: FC  DVT Prophylaxis: Lovenox Family Communication: none available Disposition Plan: Pending Improvement  Status: SDU, inpt  Elwin Mocha,  MD Family Medicine [facility] [email or web address] Password [email or web address]

## 2017-02-24 ENCOUNTER — Other Ambulatory Visit: Payer: Self-pay

## 2017-02-24 DIAGNOSIS — J441 Chronic obstructive pulmonary disease with (acute) exacerbation: Principal | ICD-10-CM

## 2017-02-24 DIAGNOSIS — J9601 Acute respiratory failure with hypoxia: Secondary | ICD-10-CM

## 2017-02-24 DIAGNOSIS — I959 Hypotension, unspecified: Secondary | ICD-10-CM

## 2017-02-24 LAB — GLUCOSE, CAPILLARY
GLUCOSE-CAPILLARY: 190 mg/dL — AB (ref 65–99)
GLUCOSE-CAPILLARY: 290 mg/dL — AB (ref 65–99)
Glucose-Capillary: 339 mg/dL — ABNORMAL HIGH (ref 65–99)
Glucose-Capillary: 221 mg/dL — ABNORMAL HIGH (ref 65–99)
Glucose-Capillary: 405 mg/dL — ABNORMAL HIGH (ref 65–99)

## 2017-02-24 LAB — BASIC METABOLIC PANEL
Anion gap: 17 — ABNORMAL HIGH (ref 5–15)
BUN: 23 mg/dL — AB (ref 6–20)
CHLORIDE: 100 mmol/L — AB (ref 101–111)
CO2: 19 mmol/L — AB (ref 22–32)
Calcium: 8.2 mg/dL — ABNORMAL LOW (ref 8.9–10.3)
Creatinine, Ser: 1.47 mg/dL — ABNORMAL HIGH (ref 0.44–1.00)
GFR calc Af Amer: 46 mL/min — ABNORMAL LOW (ref >60)
GFR calc non Af Amer: 40 mL/min — ABNORMAL LOW (ref >60)
GLUCOSE: 314 mg/dL — AB (ref 65–99)
POTASSIUM: 3.9 mmol/L (ref 3.5–5.1)
SODIUM: 136 mmol/L (ref 135–145)

## 2017-02-24 LAB — CBC
HCT: 35.4 % — ABNORMAL LOW (ref 36.0–46.0)
HEMATOCRIT: 35.6 % — AB (ref 36.0–46.0)
Hemoglobin: 10.7 g/dL — ABNORMAL LOW (ref 12.0–15.0)
Hemoglobin: 10.6 g/dL — ABNORMAL LOW (ref 12.0–15.0)
MCH: 28.1 pg (ref 26.0–34.0)
MCH: 27.7 pg (ref 26.0–34.0)
MCHC: 30.2 g/dL (ref 30.0–36.0)
MCHC: 29.8 g/dL — ABNORMAL LOW (ref 30.0–36.0)
MCV: 92.9 fL (ref 78.0–100.0)
MCV: 93 fL (ref 78.0–100.0)
PLATELETS: 264 10*3/uL (ref 150–400)
Platelets: 258 10*3/uL (ref 150–400)
RBC: 3.81 MIL/uL — ABNORMAL LOW (ref 3.87–5.11)
RBC: 3.83 MIL/uL — ABNORMAL LOW (ref 3.87–5.11)
RDW: 20.7 % — AB (ref 11.5–15.5)
RDW: 20.9 % — AB (ref 11.5–15.5)
WBC: 12.9 10*3/uL — ABNORMAL HIGH (ref 4.0–10.5)
WBC: 13.9 10*3/uL — AB (ref 4.0–10.5)

## 2017-02-24 LAB — BLOOD CULTURE ID PANEL (REFLEXED)
ACINETOBACTER BAUMANNII: NOT DETECTED
CANDIDA ALBICANS: NOT DETECTED
CANDIDA KRUSEI: NOT DETECTED
Candida glabrata: NOT DETECTED
Candida parapsilosis: NOT DETECTED
Candida tropicalis: NOT DETECTED
ENTEROBACTER CLOACAE COMPLEX: NOT DETECTED
ENTEROCOCCUS SPECIES: NOT DETECTED
ESCHERICHIA COLI: NOT DETECTED
Enterobacteriaceae species: NOT DETECTED
Haemophilus influenzae: NOT DETECTED
Klebsiella oxytoca: NOT DETECTED
Klebsiella pneumoniae: NOT DETECTED
LISTERIA MONOCYTOGENES: NOT DETECTED
Methicillin resistance: DETECTED — AB
Neisseria meningitidis: NOT DETECTED
PSEUDOMONAS AERUGINOSA: NOT DETECTED
Proteus species: NOT DETECTED
STREPTOCOCCUS AGALACTIAE: NOT DETECTED
STREPTOCOCCUS PNEUMONIAE: NOT DETECTED
Serratia marcescens: NOT DETECTED
Staphylococcus aureus (BCID): NOT DETECTED
Staphylococcus species: DETECTED — AB
Streptococcus pyogenes: NOT DETECTED
Streptococcus species: NOT DETECTED

## 2017-02-24 LAB — URINALYSIS, ROUTINE W REFLEX MICROSCOPIC
BILIRUBIN URINE: NEGATIVE
Glucose, UA: >=500 mg/dL — AB
Hgb urine dipstick: NEGATIVE
Ketones, ur: 80 mg/dL — AB
Leukocytes, UA: NEGATIVE
Nitrite: NEGATIVE
Protein, ur: NEGATIVE mg/dL
Specific Gravity, Urine: 1.018 (ref 1.005–1.030)
pH: 6 (ref 5.0–8.0)

## 2017-02-24 LAB — INFLUENZA PANEL BY PCR (TYPE A & B)
INFLBPCR: NEGATIVE
Influenza A By PCR: NEGATIVE

## 2017-02-24 LAB — CREATININE, SERUM
Creatinine, Ser: 1.58 mg/dL — ABNORMAL HIGH (ref 0.44–1.00)
GFR calc Af Amer: 42 mL/min — ABNORMAL LOW (ref >60)
GFR calc non Af Amer: 37 mL/min — ABNORMAL LOW (ref >60)

## 2017-02-24 LAB — MRSA PCR SCREENING: MRSA by PCR: NEGATIVE

## 2017-02-24 MED ORDER — INSULIN ASPART 100 UNIT/ML ~~LOC~~ SOLN
5.0000 [IU] | Freq: Once | SUBCUTANEOUS | Status: AC
  Administered 2017-02-24: 5 [IU] via SUBCUTANEOUS

## 2017-02-24 MED ORDER — TEMAZEPAM 15 MG PO CAPS
15.0000 mg | ORAL_CAPSULE | Freq: Once | ORAL | Status: AC
  Administered 2017-02-24: 15 mg via ORAL
  Filled 2017-02-24: qty 1

## 2017-02-24 MED ORDER — ELVITEG-COBIC-EMTRICIT-TENOFAF 150-150-200-10 MG PO TABS
1.0000 | ORAL_TABLET | Freq: Every day | ORAL | Status: DC
  Administered 2017-02-24 – 2017-02-25 (×2): 1 via ORAL
  Filled 2017-02-24 (×2): qty 1

## 2017-02-24 MED ORDER — SODIUM CHLORIDE 0.9 % IV BOLUS (SEPSIS)
500.0000 mL | Freq: Once | INTRAVENOUS | Status: AC
  Administered 2017-02-24: 500 mL via INTRAVENOUS

## 2017-02-24 MED ORDER — ORAL CARE MOUTH RINSE
15.0000 mL | Freq: Two times a day (BID) | OROMUCOSAL | Status: DC
  Administered 2017-02-25: 15 mL via OROMUCOSAL

## 2017-02-24 MED ORDER — AZITHROMYCIN 250 MG PO TABS
500.0000 mg | ORAL_TABLET | Freq: Every day | ORAL | Status: DC
  Administered 2017-02-24 – 2017-02-25 (×2): 500 mg via ORAL
  Filled 2017-02-24 (×3): qty 2

## 2017-02-24 MED ORDER — TRAMADOL HCL 50 MG PO TABS
50.0000 mg | ORAL_TABLET | Freq: Four times a day (QID) | ORAL | Status: DC | PRN
  Administered 2017-02-24 – 2017-02-25 (×3): 50 mg via ORAL
  Filled 2017-02-24 (×3): qty 1

## 2017-02-24 MED ORDER — INSULIN GLARGINE 100 UNIT/ML ~~LOC~~ SOLN
40.0000 [IU] | Freq: Every day | SUBCUTANEOUS | Status: DC
  Administered 2017-02-25: 40 [IU] via SUBCUTANEOUS
  Filled 2017-02-24: qty 0.4

## 2017-02-24 MED ORDER — SODIUM CHLORIDE 0.9 % IV BOLUS (SEPSIS)
1000.0000 mL | Freq: Once | INTRAVENOUS | Status: AC
  Administered 2017-02-24: 1000 mL via INTRAVENOUS

## 2017-02-24 MED ORDER — INSULIN ASPART 100 UNIT/ML ~~LOC~~ SOLN
3.0000 [IU] | Freq: Three times a day (TID) | SUBCUTANEOUS | Status: DC
Start: 2017-02-24 — End: 2017-02-25
  Administered 2017-02-24 – 2017-02-25 (×3): 3 [IU] via SUBCUTANEOUS

## 2017-02-24 MED ORDER — ENSURE ENLIVE PO LIQD
237.0000 mL | Freq: Two times a day (BID) | ORAL | Status: DC
  Administered 2017-02-24 – 2017-02-25 (×2): 237 mL via ORAL

## 2017-02-24 MED ORDER — IPRATROPIUM-ALBUTEROL 0.5-2.5 (3) MG/3ML IN SOLN
3.0000 mL | Freq: Four times a day (QID) | RESPIRATORY_TRACT | Status: DC
  Administered 2017-02-24 – 2017-02-25 (×7): 3 mL via RESPIRATORY_TRACT
  Filled 2017-02-24 (×7): qty 3

## 2017-02-24 MED ORDER — DIPHENHYDRAMINE HCL 25 MG PO CAPS
25.0000 mg | ORAL_CAPSULE | Freq: Once | ORAL | Status: AC
  Administered 2017-02-24: 25 mg via ORAL
  Filled 2017-02-24: qty 1

## 2017-02-24 NOTE — Progress Notes (Signed)
PROGRESS NOTE  [name] QAS:[id number] DOB: 06/25/1965 DOA: 02/23/2017 PCP: Michel Harrow, PA-C   LOS: 1 day   Brief Narrative / Interim history: 52 year old female with history of chronic kidney disease stage III-IV, depression, diabetes mellitus, anxiety, COPD with chronic respiratory failure, HIV, presented to the hospital with shortness of breath and wheezing, she was admitted with acute on chronic hypoxic respiratory failure requiring BiPAP.  She states that she has had multiple sick contacts at home and had some flulike symptoms prior to coming in.  She had a nonproductive cough.  She was given ceftriaxone, azithromycin and steroids and was admitted to the hospital.  Assessment & Plan: Active Problems:   COPD exacerbation ([name])  COPD exacerbation -Continue duo nebs, continue Pulmicort -Continue ceftriaxone and azithromycin -continue IV steroids  Acute on chronic hypoxic respiratory failure -Respiratory status improved this morning, she no longer needs BiPAP, continue to closely monitor in stepdown  Hypotension -In the setting of an acute viral illness, continue fluids, steroids, antibiotics -Patient tells me that she normally runs lower blood pressure however her office visits in the past showed blood pressure in the 110s.  HIV -Reports compliance with her medications, will check a CD4 count, continue home medication  Depression/anxiety -Continue Cymbalta -Hold Klonopin, doxepin  Chronic kidney disease stage III-IV -Creatinine appears close to baseline  Type 2 diabetes mellitus -Continue home insulin, sliding scale  Hyperlipidemia -Continue home medications  Tobacco abuse -Smoked up until 02/23/2017, states that she is determined to quit  Peripheral vascular disease -Status post left BKA in 2016, uses prosthesis   DVT prophylaxis: heparin Code Status: DNR (on admission and confirmed by me this morning) Family Communication: No family at  bedside Disposition Plan: Home when ready  Consultants:   None  Procedures:   None  Antimicrobials:  Ceftriaxone 2/23 >>  Azithromycin 2/23 >>  Subjective: -States that she is feeling better this morning, very anxious to go home and asking to be discharged tomorrow in the morning.  Objective: Vitals:   02/24/17 0930 02/24/17 0938 02/24/17 1115 02/24/17 1215  BP:  (!) 83/57 96/63   Pulse: 79 88 92 98  Resp:   17   Temp: 98.7 F (37.1 C)  98.7 F (37.1 C)   TempSrc: Oral  Oral   SpO2: 97% 95% 95% 96%  Weight:      Height:        Intake/Output Summary (Last 24 hours) at 02/24/2017 1214 Last data filed at 02/24/2017 0800 Gross per 24 hour  Intake 2255.83 ml  Output 600 ml  Net 1655.83 ml   Filed Weights   02/24/17 0053  Weight: 97.8 kg (215 lb 9.8 oz)    Examination:  Constitutional: Appears tachypneic Eyes: PERRL, lids and conjunctivae normal ENMT: Mucous membranes are moist.  Neck: normal, supple  Respiratory: Diffuse bilateral wheezing, overall coarse breath sounds, no crackles, slightly increased respiratory effort Cardiovascular: Regular rate and rhythm, no murmurs / rubs / gallops. No LE edema.  Abdomen: Soft, nontender, nondistended, positive bowel sounds Musculoskeletal: Left BKA Skin: no rashes Neurologic: No focal findings   Data Reviewed: I have independently reviewed following labs and imaging studies   CBC: Recent Labs  Lab 02/23/17 1736 02/24/17 0100 02/24/17 0157  WBC 22.0* 12.9* 13.9*  NEUTROABS 19.2*  --   --   HGB 13.0 10.7* 10.6*  HCT 42.0 35.4* 35.6*  MCV 91.7 92.9 93.0  PLT 316 264 798   Basic Metabolic Panel: Recent Labs  Lab 02/23/17  1736 02/24/17 0100 02/24/17 0157  NA 140  --  136  K 4.2  --  3.9  CL 99*  --  100*  CO2 26  --  19*  GLUCOSE 176*  --  314*  BUN 23*  --  23*  CREATININE 1.35* 1.58* 1.47*  CALCIUM 9.6  --  8.2*   GFR: Estimated Creatinine Clearance: 56.7 mL/min (A) (by C-G formula based on SCr  of 1.47 mg/dL (H)). Liver Function Tests: Recent Labs  Lab 02/23/17 1736  AST 30  ALT 27  ALKPHOS 105  BILITOT 0.7  PROT 6.8  ALBUMIN 3.2*   No results for input(s): LIPASE, AMYLASE in the last 168 hours. No results for input(s): AMMONIA in the last 168 hours. Coagulation Profile: No results for input(s): INR, PROTIME in the last 168 hours. Cardiac Enzymes: No results for input(s): CKTOTAL, CKMB, CKMBINDEX, TROPONINI in the last 168 hours. BNP (last 3 results) No results for input(s): PROBNP in the last 8760 hours. HbA1C: No results for input(s): HGBA1C in the last 72 hours. CBG: Recent Labs  Lab 02/23/17 2341 02/24/17 0053 02/24/17 0918  GLUCAP 266* 290* 339*   Lipid Profile: No results for input(s): CHOL, HDL, LDLCALC, TRIG, CHOLHDL, LDLDIRECT in the last 72 hours. Thyroid Function Tests: No results for input(s): TSH, T4TOTAL, FREET4, T3FREE, THYROIDAB in the last 72 hours. Anemia Panel: No results for input(s): VITAMINB12, FOLATE, FERRITIN, TIBC, IRON, RETICCTPCT in the last 72 hours. Urine analysis:    Component Value Date/Time   COLORURINE YELLOW 09/03/2016 2038   APPEARANCEUR HAZY (A) 09/03/2016 2038   LABSPEC 1.023 09/03/2016 2038   PHURINE 5.0 09/03/2016 2038   GLUCOSEU 50 (A) 09/03/2016 2038   GLUCOSEU 250 (A) 03/16/2015 1612   HGBUR NEGATIVE 09/03/2016 2038   BILIRUBINUR NEGATIVE 09/03/2016 2038   KETONESUR 5 (A) 09/03/2016 2038   PROTEINUR 30 (A) 09/03/2016 2038   UROBILINOGEN 0.2 03/16/2015 1612   NITRITE NEGATIVE 09/03/2016 2038   LEUKOCYTESUR NEGATIVE 09/03/2016 2038   Sepsis Labs: Invalid input(s): PROCALCITONIN, LACTICIDVEN  Recent Results (from the past 240 hour(s))  MRSA PCR Screening     Status: None   Collection Time: 02/24/17 12:32 AM  Result Value Ref Range Status   MRSA by PCR NEGATIVE NEGATIVE Final    Comment:        The GeneXpert MRSA Assay (FDA approved for NASAL specimens only), is one component of a comprehensive MRSA  colonization surveillance program. It is not intended to diagnose MRSA infection nor to guide or monitor treatment for MRSA infections. Performed at [facility], [place] [address] [place], [place] [address]       Radiology Studies: Dg Chest Port 1 View  Result Date: 02/23/2017 CLINICAL DATA:  Shortness of breath EXAM: PORTABLE CHEST 1 VIEW COMPARISON:  Chest radiograph 09/03/2016 FINDINGS: Unchanged mild cardiomegaly. Multiple old right-sided rib fractures. No focal consolidation or pulmonary edema. No pleural effusion. IMPRESSION: Unchanged cardiomegaly without focal airspace disease. Electronically Signed   By: Ulyses Jarred M.D.   On: 02/23/2017 18:08     Scheduled Meds: . ARIPiprazole  10 mg Oral Daily  . aspirin EC  81 mg Oral Daily  . budesonide (PULMICORT) nebulizer solution  0.25 mg Nebulization BID  . DULoxetine  60 mg Oral Daily  . elvitegravir-cobicistat-emtricitabine-tenofovir  1 tablet Oral Daily  . feeding supplement (ENSURE ENLIVE)  237 mL Oral BID BM  . fenofibrate  160 mg Oral Daily  . heparin  5,000 Units Subcutaneous Q8H  .  insulin aspart  0-15 Units Subcutaneous TID WC  . insulin glargine  35 Units Subcutaneous Daily  . ipratropium-albuterol  3 mL Nebulization Q6H  . mouth rinse  15 mL Mouth Rinse BID  . methylPREDNISolone (SOLU-MEDROL) injection  125 mg Intravenous Q12H  . pantoprazole  40 mg Oral Daily  . rosuvastatin  20 mg Oral Daily   Continuous Infusions: . sodium chloride Stopped (02/24/17 1113)  . albuterol 10 mg/hr (02/23/17 1818)  . cefTRIAXone (ROCEPHIN)  IV    . sodium chloride      Marzetta Board, MD, PhD [facility] Pager [phone]  If 7PM-7AM, please contact night-coverage [email or web address] Password [facility] 02/24/2017, 12:14 PM

## 2017-02-24 NOTE — Progress Notes (Signed)
PHARMACY - PHYSICIAN COMMUNICATION CRITICAL VALUE ALERT - BLOOD CULTURE IDENTIFICATION (BCID)  [name] is an 52 y.o. female who presented to [facility] on 02/23/2017 with a chief complaint of shortness of breath and wheezing.   Assessment:  1/2 BCx with coag neg staph which is likely contaminant   Name of physician (or Provider) Contacted: X. Blount  Current antibiotics: Ceftriaxone/Azithromycin  Changes to prescribed antibiotics recommended:  Patient is on recommended antibiotics - No changes needed  Results for orders placed or performed during the hospital encounter of 02/23/17  Blood Culture ID Panel (Reflexed) (Collected: 02/23/2017  5:45 PM)  Result Value Ref Range   Enterococcus species NOT DETECTED NOT DETECTED   Listeria monocytogenes NOT DETECTED NOT DETECTED   Staphylococcus species DETECTED (A) NOT DETECTED   Staphylococcus aureus NOT DETECTED NOT DETECTED   Methicillin resistance DETECTED (A) NOT DETECTED   Streptococcus species NOT DETECTED NOT DETECTED   Streptococcus agalactiae NOT DETECTED NOT DETECTED   Streptococcus pneumoniae NOT DETECTED NOT DETECTED   Streptococcus pyogenes NOT DETECTED NOT DETECTED   Acinetobacter baumannii NOT DETECTED NOT DETECTED   Enterobacteriaceae species NOT DETECTED NOT DETECTED   Enterobacter cloacae complex NOT DETECTED NOT DETECTED   Escherichia coli NOT DETECTED NOT DETECTED   Klebsiella oxytoca NOT DETECTED NOT DETECTED   Klebsiella pneumoniae NOT DETECTED NOT DETECTED   Proteus species NOT DETECTED NOT DETECTED   Serratia marcescens NOT DETECTED NOT DETECTED   Haemophilus influenzae NOT DETECTED NOT DETECTED   Neisseria meningitidis NOT DETECTED NOT DETECTED   Pseudomonas aeruginosa NOT DETECTED NOT DETECTED   Candida albicans NOT DETECTED NOT DETECTED   Candida glabrata NOT DETECTED NOT DETECTED   Candida krusei NOT DETECTED NOT DETECTED   Candida parapsilosis NOT DETECTED NOT DETECTED   Candida tropicalis NOT  DETECTED NOT DETECTED    Albertina Parr, PharmD., BCPS Clinical Pharmacist Pager [phone]

## 2017-02-24 NOTE — Progress Notes (Signed)
Pt.'s BP 83/57 (58) in LUE at 1554, 112/48 (63) in RUE at 1600 & 114/53 (68) in RUE at 1605. Pt. asymptomatic. MD notified and ordered to just continue monitoring with no further orders since pt. is asymptomatic.

## 2017-02-24 NOTE — Plan of Care (Signed)
Pts respiratory status has improved throughout the day. Pt was initially on [facility] today and was able to be weaned down to [facility] during the day. Pts baseline is no oxygen during the day and 2LNC at bedtime at home. Pt still has expiratory wheezes, however no crackles or rhonchi was noted upon assessment.  Pt has had no issues with skin integrity during the shift. Pt is able to pull herself up in the bed and reposition independently. Pt also received a bath tonight.  Will continue to monitor.

## 2017-02-24 NOTE — ED Notes (Signed)
IV team at beside 

## 2017-02-24 NOTE — Progress Notes (Signed)
Patient is currently on 1LNC with sats of 100%. BIPAP is not needed at this time. Will continue to monitor.

## 2017-02-24 NOTE — ED Provider Notes (Signed)
[facility] Provider Note   CSN: [id number] Arrival date & time: 02/23/17  1727     History   Chief Complaint Chief Complaint  Patient presents with  . Shortness of Breath    HPI [name] is a 52 y.o. female.   Shortness of Breath  This is a recurrent problem. The problem occurs continuously.The problem has been gradually worsening. Associated symptoms include rhinorrhea, cough and wheezing. Pertinent negatives include no fever and no syncope. She has tried nothing for the symptoms. The treatment provided no relief. She has had prior hospitalizations. She has had prior ED visits. She has had prior ICU admissions. Associated medical issues include asthma and COPD.    Past Medical History:  Diagnosis Date  . Acute metabolic encephalopathy 03/16/4006  . Acute on chronic respiratory failure with hypoxia ([place]) 05/19/2016  . Anxiety   . ARF (acute renal failure) ([place]) 05/03/2014  . Arthritis    LEFT HIP  . Asthma    HOSPITALIZED WITH EXCERBATION OF ASTHMA - AND BRONCHITIS AND THE FLU DEC 2013  . Asthma exacerbation attacks 12/28/2011  . Bronchitis 12/28/2011  . CAP (community acquired pneumonia) 07/25/2012  . Chronic kidney disease (CKD), stage IV (severe) ([place]) 11/10/2014  . Class 3 obesity due to excess calories with serious comorbidity and body mass index (BMI) of 40.0 to 44.9 in adult   . Colles' fracture of left radius   . Depression   . Diabetes mellitus    ON INSULIN AND ORAL MEDICATIONS  . Diabetes mellitus type 2 with complications, uncontrolled ([place]) 04/19/2008   Qualifier: Diagnosis of  By: Tomma Lightning MD, Claiborne Billings     . Diabetic foot infection ([place]) 11/09/2014  . Diabetic ketoacidosis without coma associated with type 2 diabetes mellitus ([place])   . Diabetic neuropathy ([place]) 11/10/2014  . Diarrhea 08/30/2008   Qualifier: Diagnosis of  By: Tomma Lightning MD, Claiborne Billings    . Diastolic dysfunction 67/06/1949  . Difficult intravenous access   . DKA, type 2 ([place])  04/04/2016  . Dyslipidemia 12/23/2008   Qualifier: Diagnosis of  By: Tomma Lightning MD, Claiborne Billings    . Essential hypertension 04/19/2008   Qualifier: Diagnosis of  By: Tomma Lightning MD, Claiborne Billings    . FATIGUE 07/12/2008   Qualifier: Diagnosis of  By: Tomma Lightning MD, Claiborne Billings    . Femoral neck fracture ([place]) 07/16/2011  . Femur fracture, left ([place]) 07/08/2014  . Fever   . Fracture of distal femur ([place]) 07/08/2014  . GERD (gastroesophageal reflux disease)   . Hereditary and idiopathic peripheral neuropathy 04/19/2008   Qualifier: Diagnosis of  By: Tomma Lightning MD, Claiborne Billings    . Hip fracture, left ([place]) 07/16/2011  . Hip pain   . HIV infection ([place]) 2000  . Human immunodeficiency virus (HIV) disease ([place]) 04/19/2008   [id number] +   . Hyperlipidemia   . Hypertension   . Hyponatremia 12/29/2011  . Influenza A 12/29/2011  . Influenza B   . Insomnia 06/12/2016  . Left hip pain 05/03/2014  . Mood disorder ([place]) 04/19/2008   Qualifier: Diagnosis of  By: Tomma Lightning MD, Claiborne Billings    . Neuropathy    NEUROPATHY HANDS AND FEET  . NSTEMI (non-ST elevated myocardial infarction) ([place]) 05/19/2016  . Obesity hypoventilation syndrome ([place])   . Osteoporosis 07/08/2014  . Pain    SEVERE PAIN LEFT HIP - HX OF LEFT HIP PINNING JULY 2013  . Perimenopausal symptoms 02/13/2012   LMP around 2011. On estrace and provera since around 2012 for hot  flashes, mood swings. Estrace 2 mg daily, Provera 2.5 mg for 5 days each month.   . Repeated falls 07/30/2011  . Sepsis ([place]) 04/04/2016  . Shortness of breath    ALLERGIES ARE "ACTING UP"  . SOB (shortness of breath)   . THRUSH 05/20/2008   Qualifier: Diagnosis of  By: Tomma Lightning MD, Claiborne Billings      . Tobacco use disorder 04/14/2010    Patient Active Problem List   Diagnosis Date Noted  . COPD exacerbation ([place]) 02/23/2017  . Acute on chronic respiratory failure with hypoxia and hypercapnia ([place]) 08/19/2016  . Leukocytosis 08/19/2016  . CKD (chronic kidney disease), stage III ([place]) 08/19/2016  . Altered mental status  08/19/2016  . Asthma exacerbation 06/18/2016  . Insomnia 06/12/2016  . Difficult intravenous access   . Acute on chronic respiratory failure with hypoxia ([place]) 05/19/2016  . NSTEMI (non-ST elevated myocardial infarction) ([place]) 05/19/2016  . Acute metabolic encephalopathy 46/96/2952  . Hyperlipidemia   . Class 3 obesity due to excess calories with serious comorbidity and body mass index (BMI) of 40.0 to 44.9 in adult   . Obesity hypoventilation syndrome ([place])   . Diastolic dysfunction 84/13/2440  . Diabetic neuropathy ([place]) 11/10/2014  . Fracture of distal femur ([place]) 07/08/2014  . Osteoporosis 07/08/2014  . Femur fracture, left ([place]) 07/08/2014  . Colles' fracture of left radius   . Left hip pain 05/03/2014  . Perimenopausal symptoms 02/13/2012  . Hyponatremia 12/29/2011  . Asthma exacerbation attacks 12/28/2011  . Bronchitis 12/28/2011  . Repeated falls 07/30/2011  . Hip fracture, left ([place]) 07/16/2011  . Femoral neck fracture ([place]) 07/16/2011  . Dyslipidemia 12/23/2008  . THRUSH 05/20/2008  . Human immunodeficiency virus (HIV) disease ([place]) 04/19/2008  . Diabetes mellitus type 2 with complications, uncontrolled ([place]) 04/19/2008  . Mood disorder ([place]) 04/19/2008  . Hereditary and idiopathic peripheral neuropathy 04/19/2008  . Essential hypertension 04/19/2008    Past Surgical History:  Procedure Laterality Date  . AMPUTATION Left 11/18/2014   Procedure: LEFT BELOW KNEE AMPUTATION;  Surgeon: Leandrew Koyanagi, MD;  Location: [facility];  Service: Orthopedics;  Laterality: Left;  . CAST APPLICATION Left 1/0/2725   Procedure: CAST APPLICATION (FIBERGLASS);  Surgeon: Latanya Maudlin, MD;  Location: [facility];  Service: Orthopedics;  Laterality: Left;  CLOSED REDUCTION OF LEFT COLLES FRACTURE WITH SHORT ARM CAST  . HARDWARE REMOVAL Left 05/05/2014   Procedure: HARDWARE REMOVAL LEFT HIP;  Surgeon: Latanya Maudlin, MD;  Location: [facility];  Service: Orthopedics;  Laterality: Left;  REMOVAL BIOMET  6.5-8.0 CANNULATED SCREW  . HIP ARTHROPLASTY Left 05/05/2014   Procedure: ARTHROPLASTY OPEN REDUCTION INTERNAL FIXATION LEFT HIP AND REMOVAL OF TWO CANNULATED SCREW;  Surgeon: Latanya Maudlin, MD;  Location: [facility];  Service: Orthopedics;  Laterality: Left;  . HIP PINNING,CANNULATED  07/16/2011   Procedure: CANNULATED HIP PINNING;  Surgeon: Gearlean Alf, MD;  Location: [facility];  Service: Orthopedics;  Laterality: Left;  . HIP PINNING,CANNULATED Left 04/09/2012   Procedure: CANNULATED HIP PINNING AND HARDWARE REVISION;  Surgeon: Gearlean Alf, MD;  Location: [facility];  Service: Orthopedics;  Laterality: Left;  . I&D EXTREMITY Left 11/16/2014   Procedure:  DEBRIDEMENT OF LEFT FOOT POSSIBLE APPLICATION OF INTEGRIA AND VAC ;  Surgeon: Irene Limbo, MD;  Location: [facility];  Service: Plastics;  Laterality: Left;  . PERIPHERAL VASCULAR CATHETERIZATION N/A 11/15/2014   Procedure: Abdominal Aortogram;  Surgeon: Conrad San Lucas, MD;  Location: [facility];  Service: Cardiovascular;  Laterality: N/A;  .  PERIPHERAL VASCULAR CATHETERIZATION  11/15/2014   Procedure: Lower Extremity Angiography;  Surgeon: Conrad Cresson, MD;  Location: [facility];  Service: Cardiovascular;;  . PERIPHERAL VASCULAR CATHETERIZATION Left 11/15/2014   Procedure: Peripheral Vascular Intervention;  Surgeon: Conrad Farmland, MD;  Location: [facility];  Service: Cardiovascular;  Laterality: Left;  popliteal artery stenting    OB History    Gravida Para Term Preterm AB Living   '2 1 1 '$ 0 1 1   SAB TAB Ectopic Multiple Live Births   1   0 0         Home Medications    Prior to Admission medications   Medication Sig Start Date End Date Taking? Authorizing Provider  albuterol (PROAIR HFA) 108 (90 Base) MCG/ACT inhaler Inhale 2 puffs into the lungs every 6 (six) hours as needed for wheezing or shortness of breath. 11/28/16  Yes Chesley Mires, MD  ARIPiprazole (ABILIFY) 10 MG tablet Take 10 mg by mouth every morning.  02/13/17  Yes [provider]  clonazePAM (KLONOPIN) 1 MG tablet Take 1 mg by mouth 3 (three) times daily as needed for anxiety. 02/13/17  Yes [provider]  doxepin (SINEQUAN) 10 MG capsule Take 20 mg by mouth at bedtime. 02/13/17  Yes [provider]  DULoxetine (CYMBALTA) 60 MG capsule Take 60 mg by mouth daily. 08/16/16  Yes [provider]  fenofibrate (TRICOR) 145 MG tablet Take 145 mg by mouth daily.   Yes [provider]  fluticasone furoate-vilanterol (BREO ELLIPTA) 100-25 MCG/INH AEPB Inhale 1 puff into the lungs daily. 11/28/16  Yes Chesley Mires, MD  furosemide (LASIX) 80 MG tablet Take 80 mg by mouth daily. 02/10/17  Yes [provider]  GENVOYA 150-150-200-10 MG TABS tablet TAKE 1 TABLET BY MOUTH EVERY DAY 12/19/15  Yes Campbell Riches, MD  HYDROcodone-acetaminophen (NORCO) 10-325 MG tablet Take 1 tablet by mouth every 8 (eight) hours as needed (pain).   Yes [provider]  Insulin Glargine (TOUJEO SOLOSTAR) 300 UNIT/ML SOPN Inject 65 Units into the skin daily. 02/11/17  Yes Elayne Snare, MD  insulin regular human CONCENTRATED (HUMULIN R U-500 KWIKPEN) 500 UNIT/ML kwikpen INJECT 60 UNITS BEFORE BREAKFAST, 50 UNITS BEFORE LUNCH, AND 35 UNITS BEFORE DINNER. 02/11/17  Yes Elayne Snare, MD  liraglutide (VICTOZA) 18 MG/3ML SOPN INJECT 1.8 MG INTO THE SKIN DAILY 02/11/17  Yes Elayne Snare, MD  [facility] 4 MG/0.1ML LIQD nasal spray kit Place 1 spray into the nose as needed. overdose 02/01/17  Yes [provider]  pantoprazole (PROTONIX) 40 MG tablet Take 40 mg by mouth daily. 09/02/14  Yes [provider]  potassium chloride SA (K-DUR,KLOR-CON) 20 MEQ tablet Take 1 tablet (20 mEq total) by mouth daily. X 3 days, then check potassium levels if needed to continue Patient taking differently: Take 20 mEq by mouth daily.  05/25/16  Yes Rai, Ripudeep K, MD  pregabalin (LYRICA) 225 MG capsule Take 225 mg by mouth 2 (two) times daily.    Yes [provider]  rosuvastatin (CRESTOR) 20 MG tablet Take 20 mg by mouth daily. Reported on 05/02/2015 09/02/14  Yes [provider]  temazepam (RESTORIL) 15 MG capsule Take 15 mg by mouth at bedtime as needed for sleep. 02/13/17  Yes [provider]  tiZANidine (ZANAFLEX) 4 MG tablet Take 4 mg by mouth 2 (two) times daily.  03/06/16  Yes [provider]  ACCU-CHEK AVIVA PLUS test strip TEST BLOOD SUGAR THREE TIMES DAILY  AS DIRECTED 11/28/16   Elayne Snare, MD  aspirin EC 81 MG EC tablet Take 1 tablet (81 mg total) by mouth daily. Patient taking differently: Take 81 mg by mouth.  05/25/16   Rai, Ripudeep K, MD  Blood Glucose Monitoring Suppl (ACCU-CHEK AVIVA PLUS) w/Device KIT USE TO TEST BLOOD SUGAR DAILY 07/03/16   Elayne Snare, MD  diphenoxylate-atropine (LOMOTIL) 2.5-0.025 MG tablet Take 1 tablet by mouth 4 (four) times daily as needed for diarrhea or loose stools. 11/20/16   Campbell Riches, MD    Family History Family History  Problem Relation Age of Onset  . Diabetes Mother   . Hypertension Mother   . Vision loss Mother   . Heart disease Mother   . Hypertension Father   . Thyroid disease Neg Hx     Social History Social History   Tobacco Use  . Smoking status: Current Every Day Smoker    Packs/day: 0.50    Years: 35.00    Pack years: 17.50    Types: E-cigarettes, Cigarettes  . Smokeless tobacco: Never Used  Substance Use Topics  . Alcohol use: No    Alcohol/week: 0.0 oz    Comment: no drink since 2008  . Drug use: No    Comment: hx of crack/cocaine, clean 8 years     Allergies   Cortizone-10 [hydrocortisone]   Review of Systems Review of Systems  Constitutional: Negative for fever.  HENT: Positive for rhinorrhea.   Respiratory: Positive for cough, shortness of breath and wheezing.   Cardiovascular: Negative for syncope.  All other systems reviewed and are negative.    Physical Exam Updated Vital Signs BP 103/89 (BP  Location: Left Arm)   Pulse 100   Temp (!) 96.1 F (35.6 C) (Temporal)   Resp 16   LMP 11/16/2010   SpO2 94%   Physical Exam  Constitutional: She is oriented to person, place, and time. She appears well-developed and well-nourished. She appears distressed.  HENT:  Head: Normocephalic and atraumatic.  Eyes: Conjunctivae and EOM are normal.  Neck: Normal range of motion.  Cardiovascular: Normal rate and regular rhythm.  Pulmonary/Chest: Effort normal and breath sounds normal. No stridor. No respiratory distress.  Abdominal: She exhibits no distension.  Musculoskeletal: Normal range of motion. She exhibits no edema.       Right lower leg: Normal.       Left lower leg: Normal.  Neurological: She is alert and oriented to person, place, and time.  Skin: Skin is warm and dry.  Nursing note and vitals reviewed.    ED Treatments / Results  Labs (all labs ordered are listed, but only abnormal results are displayed) Labs Reviewed  COMPREHENSIVE METABOLIC PANEL - Abnormal; Notable for the following components:      Result Value   Chloride 99 (*)    Glucose, Bld 176 (*)    BUN 23 (*)    Creatinine, Ser 1.35 (*)    Albumin 3.2 (*)    GFR calc non Af Amer 44 (*)    GFR calc Af Amer 51 (*)    All other components within normal limits  CBC WITH DIFFERENTIAL/PLATELET - Abnormal; Notable for the following components:   WBC 22.0 (*)    RDW 20.6 (*)    Neutro Abs 19.2 (*)    Monocytes Absolute 1.1 (*)    All other components within normal limits  BRAIN NATRIURETIC PEPTIDE - Abnormal; Notable for the following components:   B Natriuretic Peptide 722.2 (*)  All other components within normal limits  I-STAT VENOUS BLOOD GAS, ED - Abnormal; Notable for the following components:   pO2, Ven 63.0 (*)    All other components within normal limits  CBG MONITORING, ED - Abnormal; Notable for the following components:   Glucose-Capillary 266 (*)    All other components within normal limits    CULTURE, BLOOD (ROUTINE X 2)  CULTURE, BLOOD (ROUTINE X 2)  MRSA PCR SCREENING  URINALYSIS, ROUTINE W REFLEX MICROSCOPIC  CBC  CREATININE, SERUM  BASIC METABOLIC PANEL  CBC  I-STAT TROPONIN, ED  I-STAT CG4 LACTIC ACID, ED  I-STAT CG4 LACTIC ACID, ED    EKG  EKG Interpretation  Date/Time:  Saturday February 23 2017 17:38:32 EST Ventricular Rate:  114 PR Interval:    QRS Duration: 110 QT Interval:  328 QTC Calculation: 452 R Axis:   -19 Text Interpretation:  Sinus tachycardia Borderline left axis deviation Low voltage, precordial leads Abnormal R-wave progression, early transition Baseline wander in lead(s) II aVR V1 No significant change since last tracing Confirmed by Merrily Pew [phone]) on 02/23/2017 6:02:22 PM       Radiology Dg Chest Port 1 View  Result Date: 02/23/2017 CLINICAL DATA:  Shortness of breath EXAM: PORTABLE CHEST 1 VIEW COMPARISON:  Chest radiograph 09/03/2016 FINDINGS: Unchanged mild cardiomegaly. Multiple old right-sided rib fractures. No focal consolidation or pulmonary edema. No pleural effusion. IMPRESSION: Unchanged cardiomegaly without focal airspace disease. Electronically Signed   By: Ulyses Jarred M.D.   On: 02/23/2017 18:08    Procedures Procedures (including critical care time)  CRITICAL CARE Performed by: Merrily Pew Total critical care time: 35 minutes Critical care time was exclusive of separately billable procedures and treating other patients. Critical care was necessary to treat or prevent imminent or life-threatening deterioration. Critical care was time spent personally by me on the following activities: development of treatment plan with patient and/or surrogate as well as nursing, discussions with consultants, evaluation of patient's response to treatment, examination of patient, obtaining history from patient or surrogate, ordering and performing treatments and interventions, ordering and review of laboratory studies, ordering and  review of radiographic studies, pulse oximetry and re-evaluation of patient's condition.   Medications Ordered in ED Medications  albuterol (PROVENTIL,VENTOLIN) solution continuous neb (10 mg/hr Nebulization New Bag/Given 02/23/17 1818)  sodium chloride 0.9 % bolus 500 mL (not administered)  fentaNYL (SUBLIMAZE) injection 100 mcg (not administered)  cefTRIAXone (ROCEPHIN) 1 g in sodium chloride 0.9 % 100 mL IVPB (not administered)  methylPREDNISolone sodium succinate (SOLU-MEDROL) 125 mg/2 mL injection 125 mg (not administered)  ARIPiprazole (ABILIFY) tablet 10 mg (not administered)  aspirin EC tablet 81 mg (not administered)  DULoxetine (CYMBALTA) DR capsule 60 mg (not administered)  fenofibrate tablet 160 mg (not administered)  budesonide (PULMICORT) nebulizer solution 0.25 mg (0.25 mg Nebulization Not Given 02/24/17 0041)  insulin glargine (LANTUS) injection 35 Units (not administered)  pantoprazole (PROTONIX) EC tablet 40 mg (not administered)  rosuvastatin (CRESTOR) tablet 20 mg (not administered)  heparin injection 5,000 Units (not administered)  0.9 %  sodium chloride infusion (not administered)  acetaminophen (TYLENOL) tablet 650 mg (not administered)    Or  acetaminophen (TYLENOL) suppository 650 mg (not administered)  ondansetron (ZOFRAN) tablet 4 mg (not administered)    Or  ondansetron (ZOFRAN) injection 4 mg (not administered)  insulin aspart (novoLOG) injection 0-15 Units (not administered)  cefTRIAXone (ROCEPHIN) 1 g in sodium chloride 0.9 % 100 mL IVPB (0 g Intravenous Stopped 02/23/17 1957)  azithromycin (  ZITHROMAX) 500 mg in sodium chloride 0.9 % 250 mL IVPB (0 mg Intravenous Stopped 02/23/17 2309)  LORazepam (ATIVAN) injection 0.5 mg (0.5 mg Intravenous Given 02/23/17 1758)  ipratropium (ATROVENT) nebulizer solution 1 mg (1 mg Nebulization Given 02/23/17 1818)  methylPREDNISolone sodium succinate (SOLU-MEDROL) 125 mg/2 mL injection 125 mg (125 mg Intravenous Given 02/23/17  1847)  sodium chloride 0.9 % bolus 1,000 mL (0 mLs Intravenous Stopped 02/23/17 2129)  fentaNYL (SUBLIMAZE) injection 50 mcg (50 mcg Intravenous Given 02/23/17 2038)  sodium chloride 0.9 % bolus 1,000 mL (0 mLs Intravenous Stopped 02/23/17 2327)     Initial Impression / Assessment and Plan / ED Course  I have reviewed the triage vital signs and the nursing notes.  Pertinent labs & imaging results that were available during my care of the patient were reviewed by me and considered in my medical decision making (see chart for details).   Initially on BiPAP secondary to respiratory distress however after couple hours this improved.  Also improved with some breathing treatments.  Has some soft blood pressures so given some fluids.  Her B PD come back little bit elevated however she had any fluid on her x-ray and she was persistently hypotensive so I think fluids were needed.  Antibiotics were given.  White count little bit high.  Patient discussed pressors versus more fluids and she preferred not doing pressors at this time.  She also said that she would not want resuscitation or intubation.  Patient's blood pressures improved and being stabilized in the high 90s to low 100s.  Mental status appropriate.  Patient stable for admission to stepdown.  Final Clinical Impressions(s) / ED Diagnoses   Final diagnoses:  COPD exacerbation ([place])  Hypotension, unspecified hypotension type  Acute respiratory failure with hypoxia ([place])     Anedra Penafiel, Corene Cornea, MD 02/24/17 [phone]

## 2017-02-24 NOTE — Progress Notes (Signed)
RN has placed pulse ox probe on pt 3 times during the shift. Pt refusing to keep pulse ox on. Will continue to monitor. Clint Bolder, RN 02/24/17 4:28 AM

## 2017-02-25 LAB — BASIC METABOLIC PANEL
ANION GAP: 11 (ref 5–15)
BUN: 23 mg/dL — ABNORMAL HIGH (ref 6–20)
CALCIUM: 8.8 mg/dL — AB (ref 8.9–10.3)
CO2: 25 mmol/L (ref 22–32)
Chloride: 104 mmol/L (ref 101–111)
Creatinine, Ser: 1.08 mg/dL — ABNORMAL HIGH (ref 0.44–1.00)
GFR, EST NON AFRICAN AMERICAN: 58 mL/min — AB (ref >60)
GLUCOSE: 303 mg/dL — AB (ref 65–99)
POTASSIUM: 4.1 mmol/L (ref 3.5–5.1)
Sodium: 140 mmol/L (ref 135–145)

## 2017-02-25 LAB — CBC
HEMATOCRIT: 32.4 % — AB (ref 36.0–46.0)
HEMOGLOBIN: 10.2 g/dL — AB (ref 12.0–15.0)
MCH: 28.1 pg (ref 26.0–34.0)
MCHC: 31.5 g/dL (ref 30.0–36.0)
MCV: 89.3 fL (ref 78.0–100.0)
Platelets: 241 10*3/uL (ref 150–400)
RBC: 3.63 MIL/uL — AB (ref 3.87–5.11)
RDW: 20.4 % — ABNORMAL HIGH (ref 11.5–15.5)
WBC: 8.1 10*3/uL (ref 4.0–10.5)

## 2017-02-25 LAB — GLUCOSE, CAPILLARY
GLUCOSE-CAPILLARY: 374 mg/dL — AB (ref 65–99)
Glucose-Capillary: 351 mg/dL — ABNORMAL HIGH (ref 65–99)

## 2017-02-25 LAB — T-HELPER CELLS (CD4) COUNT (NOT AT ARMC)
CD4 % Helper T Cell: 45 % (ref 33–55)
CD4 T Cell Abs: 220 /uL — ABNORMAL LOW (ref 400–2700)

## 2017-02-25 MED ORDER — DOXYCYCLINE HYCLATE 100 MG PO CAPS: 100.0000 mg | ORAL_CAPSULE | Freq: Two times a day (BID) | ORAL | 0 refills | Status: DC

## 2017-02-25 MED ORDER — PREDNISONE 10 MG PO TABS: 40.0000 mg | ORAL_TABLET | Freq: Every day | ORAL | 0 refills | Status: DC

## 2017-02-25 NOTE — Care Management Note (Signed)
Case Management Note  Patient Details  Name: JALESHA PLOTZ MRN: [id number] Date of Birth: 1965-05-06  Subjective/Objective:  From home, presents with copd ex. She has pcp and medication coverage. She states she will need transport to go home, CSW assisting with transport.                  Action/Plan: DC home today with transport assist.  Expected Discharge Date:  02/25/17               Expected Discharge Plan:  Home/Self Care  In-House Referral:     Discharge planning Services  CM Consult  Post Acute Care Choice:    Choice offered to:     DME Arranged:    DME Agency:     HH Arranged:    HH Agency:     Status of Service:  Completed, signed off  If discussed at [employer] of Stay Meetings, dates discussed:    Additional Comments:  Zenon Mayo, RN 02/25/2017, 10:58 AM

## 2017-02-25 NOTE — Plan of Care (Signed)
Patient maintaining O2 saturation WNL on room air while awake. Patient urinating without difficulty.  VSS

## 2017-02-25 NOTE — Progress Notes (Signed)
Inpatient Diabetes Program Recommendations  AACE/ADA: New Consensus Statement on Inpatient Glycemic Control (2015)  Target Ranges:  Prepandial:   less than 140 mg/dL      Peak postprandial:   less than 180 mg/dL (1-2 hours)      Critically ill patients:  140 - 180 mg/dL   Lab Results  Component Value Date   GLUCAP 351 (H) 02/25/2017   HGBA1C 8.5 08/24/2016    Review of Glycemic Control Results for [name], [name] (MRN [id number]) as of 02/25/2017 10:38  Ref. Range 02/24/2017 09:18 02/24/2017 12:32 02/24/2017 17:15 02/24/2017 21:51 02/25/2017 08:33  Glucose-Capillary Latest Ref Range: 65 - 99 mg/dL 339 (H) 221 (H) 405 (H) 190 (H) 351 (H)   Diabetes history: DM2 Outpatient Diabetes medications: Toujeo 65 units qd + Humulin R U 500 60 units ac breakfast + 50 units ac lunch + 35 units ac dinner + Victoza 1.8 mg qd Current orders for Inpatient glycemic control: Lantus 40 units + Novolog 3 units tid meal coverage + Novolog moderate correction tid  Inpatient Diabetes Program Recommendations:    Noted patient has been on U 500 @ home in addition to Glargine.  -Increase Lantus to 65 units daily -Novolog 10 units tid meal coverage if eats 50%  Thank you, Bethena Roys E. Khloei Spiker, RN, MSN, CDE  Diabetes Coordinator Inpatient Glycemic Control Team Team Pager [phone] (8am-5pm) 02/25/2017 10:42 AM

## 2017-02-25 NOTE — Discharge Summary (Signed)
Physician Discharge Summary  [name]:[id number] DOB: March 19, 1965 DOA: 02/23/2017  PCP: Michel Harrow, PA-C  Admit date: 02/23/2017 Discharge date: 02/25/2017  Admitted From: home Disposition:  Home   Recommendations for Outpatient Follow-up:  1. Follow up with PCP in 1-2 weeks 2. Continue doxycycline for 4 more days 3. Continue prednisone taper as prescribed  Home Health: none Equipment/Devices: none  Discharge Condition: stable CODE STATUS: DNR Diet recommendation: regulat  HPI: Per Dr. Aggie Moats, Sunday [name] is a 52 y.o. female with past medical history significant for CKD 4, depression, diabetes, anxiety, chronic respiratory failure and asthma to the hospital with acute shortness of breath.  Patient admits to still smoking and having multiple sick contacts with flulike symptoms.  At home she tried to treat herself with her nebulizer multiple times before coming to the hospital.  This was ineffective.  Patient activated EMS and received treatments in route without any relief.  Patient states she is compliant with all her medications.  She has had a nonproductive hacking cough. History limited because patient states she is too tired and short of breath to give extensive history. Course: Patient started on azithromycin and Rocephin.  Given multiple nebulizer treatments.  Patient was able to be weaned from BiPAP to 5 L nasal cannula.  His blood pressure was noticed to be soft but patient had received narcotics.  Patient was bolused to have liters of IV fluid in the ED.  Hospitalist consulted for admission.   Hospital Course: COPD exacerbation -patient was admitted to the hospital with COPD exacerbation in the setting of a URI.  She was started on nebulizers, Pulmicort, empiric antibiotics as well as steroids.  She improved rapidly, her oxygen was able to be weaned off to her home baseline, her wheezing has resolved and she was back to baseline.  She was asking to go home,  she is stable, and was discharged home to complete antibiotics as well as a steroid taper. Continue duo nebs, continue Pulmicort Acute on chronic hypoxic respiratory failure -respiratory status back to baseline Hypotension- In the setting of an acute viral illness, she was given fluids, steroids, blood pressure is now normalized.  She tells me she usually runs on the lower side HIV -Reports compliance with her medications, will check a CD4 count, continue home medication Depression/anxiety -Continue home regimen  Chronic kidney disease stage III-IV -Creatinine appears close to baseline Type 2 diabetes mellitus -continue home regimen  Hyperlipidemia -Continue home medications Tobacco abuse -Smoked up until 02/23/2017, states that she is determined to quit Peripheral vascular disease -Status post left BKA in 2016, uses prosthesis    Discharge Diagnoses:  Active Problems:   COPD exacerbation [facility])     Discharge Instructions   Allergies as of 02/25/2017      Reactions   Cortizone-10 [hydrocortisone] Rash   Per patient, given local injection at knee and developed rash at local site.       Medication List    TAKE these medications   ACCU-CHEK AVIVA PLUS test strip Generic drug:  glucose blood TEST BLOOD SUGAR THREE TIMES DAILY AS DIRECTED   ACCU-CHEK AVIVA PLUS w/Device Kit USE TO TEST BLOOD SUGAR DAILY   albuterol 108 (90 Base) MCG/ACT inhaler Commonly known as:  PROAIR HFA Inhale 2 puffs into the lungs every 6 (six) hours as needed for wheezing or shortness of breath.   ARIPiprazole 10 MG tablet Commonly known as:  ABILIFY Take 10 mg by mouth every morning.  aspirin 81 MG EC tablet Take 1 tablet (81 mg total) by mouth daily. What changed:  when to take this   clonazePAM 1 MG tablet Commonly known as:  KLONOPIN Take 1 mg by mouth 3 (three) times daily as needed for anxiety.   diphenoxylate-atropine 2.5-0.025 MG tablet Commonly known as:  LOMOTIL Take 1 tablet by  mouth 4 (four) times daily as needed for diarrhea or loose stools.   doxepin 10 MG capsule Commonly known as:  SINEQUAN Take 20 mg by mouth at bedtime.   doxycycline 100 MG capsule Commonly known as:  VIBRAMYCIN Take 1 capsule (100 mg total) by mouth 2 (two) times daily.   DULoxetine 60 MG capsule Commonly known as:  CYMBALTA Take 60 mg by mouth daily.   fenofibrate 145 MG tablet Commonly known as:  TRICOR Take 145 mg by mouth daily.   fluticasone furoate-vilanterol 100-25 MCG/INH Aepb Commonly known as:  BREO ELLIPTA Inhale 1 puff into the lungs daily.   furosemide 80 MG tablet Commonly known as:  LASIX Take 80 mg by mouth daily.   GENVOYA 150-150-200-10 MG Tabs tablet Generic drug:  elvitegravir-cobicistat-emtricitabine-tenofovir TAKE 1 TABLET BY MOUTH EVERY DAY   HYDROcodone-acetaminophen 10-325 MG tablet Commonly known as:  NORCO Take 1 tablet by mouth every 8 (eight) hours as needed (pain).   Insulin Glargine 300 UNIT/ML Sopn Commonly known as:  TOUJEO SOLOSTAR Inject 65 Units into the skin daily.   insulin regular human CONCENTRATED 500 UNIT/ML kwikpen Commonly known as:  HUMULIN R U-500 KWIKPEN INJECT 60 UNITS BEFORE BREAKFAST, 50 UNITS BEFORE LUNCH, AND 35 UNITS BEFORE DINNER.   liraglutide 18 MG/3ML Sopn Commonly known as:  VICTOZA INJECT 1.8 MG INTO THE SKIN DAILY   NARCAN 4 MG/0.1ML Liqd nasal spray kit Generic drug:  naloxone Place 1 spray into the nose as needed. overdose   pantoprazole 40 MG tablet Commonly known as:  PROTONIX Take 40 mg by mouth daily.   potassium chloride SA 20 MEQ tablet Commonly known as:  K-DUR,KLOR-CON Take 1 tablet (20 mEq total) by mouth daily. X 3 days, then check potassium levels if needed to continue What changed:  additional instructions   predniSONE 10 MG tablet Commonly known as:  DELTASONE Take 4 tablets (40 mg total) by mouth daily. 4 tablets daily x 4 days then 3 daily x 4 days then 2 daily x 4 days then 1  daily x 4 days   pregabalin 225 MG capsule Commonly known as:  LYRICA Take 225 mg by mouth 2 (two) times daily.   rosuvastatin 20 MG tablet Commonly known as:  CRESTOR Take 20 mg by mouth daily. Reported on 05/02/2015   temazepam 15 MG capsule Commonly known as:  RESTORIL Take 15 mg by mouth at bedtime as needed for sleep.   tiZANidine 4 MG tablet Commonly known as:  ZANAFLEX Take 4 mg by mouth 2 (two) times daily.        Consultations:  None   Procedures/Studies:  Dg Chest Port 1 View  Result Date: 02/23/2017 CLINICAL DATA:  Shortness of breath EXAM: PORTABLE CHEST 1 VIEW COMPARISON:  Chest radiograph 09/03/2016 FINDINGS: Unchanged mild cardiomegaly. Multiple old right-sided rib fractures. No focal consolidation or pulmonary edema. No pleural effusion. IMPRESSION: Unchanged cardiomegaly without focal airspace disease. Electronically Signed   By: Ulyses Jarred M.D.   On: 02/23/2017 18:08      Subjective: - no chest pain, shortness of breath, no abdominal pain, nausea or vomiting. Asking to go home  Discharge Exam: Vitals:   02/25/17 1250 02/25/17 1319  BP: 115/65   Pulse: 94   Resp: 20   Temp: 97.6 F (36.4 C)   SpO2: 93% 93%    General: Pt is alert, awake, not in acute distress Cardiovascular: RRR, S1/S2 +, no rubs, no gallops Respiratory: CTA bilaterally, no wheezing, no rhonchi Abdominal: Soft, NT, ND, bowel sounds + Extremities: no edema, no cyanosis    The results of significant diagnostics from this hospitalization (including imaging, microbiology, ancillary and laboratory) are listed below for reference.     Microbiology: Recent Results (from the past 240 hour(s))  Blood culture (routine x 2)     Status: None (Preliminary result)   Collection Time: 02/23/17  5:45 PM  Result Value Ref Range Status   Specimen Description BLOOD RIGHT ANTECUBITAL  Final   Special Requests   Final    BOTTLES DRAWN AEROBIC AND ANAEROBIC Blood Culture adequate volume     Culture  Setup Time   Final    GRAM POSITIVE COCCI IN CLUSTERS IN CHAINS IN BOTH AEROBIC AND ANAEROBIC BOTTLES CRITICAL RESULT CALLED TO, READ BACK BY AND VERIFIED WITH: B MANCHERIL,PHARMD AT 1904 02/24/17 BY L BENFIELD Performed at [facility], [place] [address], [place], [place] [address]    Culture GRAM POSITIVE COCCI  Final   Report Status PENDING  Incomplete  Blood Culture ID Panel (Reflexed)     Status: Abnormal   Collection Time: 02/23/17  5:45 PM  Result Value Ref Range Status   Enterococcus species NOT DETECTED NOT DETECTED Final   Listeria monocytogenes NOT DETECTED NOT DETECTED Final   Staphylococcus species DETECTED (A) NOT DETECTED Final    Comment: Methicillin (oxacillin) resistant coagulase negative staphylococcus. Possible blood culture contaminant (unless isolated from more than one blood culture draw or clinical case suggests pathogenicity). No antibiotic treatment is indicated for blood  culture contaminants. CRITICAL RESULT CALLED TO, READ BACK BY AND VERIFIED WITH: B MANCHERIL,PHARMD AT 1904 02/24/17 BY L BENFIELD    Staphylococcus aureus NOT DETECTED NOT DETECTED Final   Methicillin resistance DETECTED (A) NOT DETECTED Final    Comment: CRITICAL RESULT CALLED TO, READ BACK BY AND VERIFIED WITH: B MANCHERIL,PHARMD AT 1904 02/24/17 BY L BENFIELD    Streptococcus species NOT DETECTED NOT DETECTED Final   Streptococcus agalactiae NOT DETECTED NOT DETECTED Final   Streptococcus pneumoniae NOT DETECTED NOT DETECTED Final   Streptococcus pyogenes NOT DETECTED NOT DETECTED Final   Acinetobacter baumannii NOT DETECTED NOT DETECTED Final   Enterobacteriaceae species NOT DETECTED NOT DETECTED Final   Enterobacter cloacae complex NOT DETECTED NOT DETECTED Final   Escherichia coli NOT DETECTED NOT DETECTED Final   Klebsiella oxytoca NOT DETECTED NOT DETECTED Final   Klebsiella pneumoniae NOT DETECTED NOT DETECTED Final   Proteus species NOT DETECTED NOT DETECTED Final    Serratia marcescens NOT DETECTED NOT DETECTED Final   Haemophilus influenzae NOT DETECTED NOT DETECTED Final   Neisseria meningitidis NOT DETECTED NOT DETECTED Final   Pseudomonas aeruginosa NOT DETECTED NOT DETECTED Final   Candida albicans NOT DETECTED NOT DETECTED Final   Candida glabrata NOT DETECTED NOT DETECTED Final   Candida krusei NOT DETECTED NOT DETECTED Final   Candida parapsilosis NOT DETECTED NOT DETECTED Final   Candida tropicalis NOT DETECTED NOT DETECTED Final    Comment: Performed at [facility], [place]. [address] [place], [place] [address]  MRSA PCR Screening     Status: None   Collection Time: 02/24/17  12:32 AM  Result Value Ref Range Status   MRSA by PCR NEGATIVE NEGATIVE Final    Comment:        The GeneXpert MRSA Assay (FDA approved for NASAL specimens only), is one component of a comprehensive MRSA colonization surveillance program. It is not intended to diagnose MRSA infection nor to guide or monitor treatment for MRSA infections. Performed at [facility], [place] [address], [place], [place] [address]      Labs: BNP (last 3 results) Recent Labs    06/18/16 1636 08/19/16 0827 02/23/17 1736  BNP 592.7* 65.5 176.1*   Basic Metabolic Panel: Recent Labs  Lab 02/23/17 1736 02/24/17 0100 02/24/17 0157 02/25/17 0214  NA 140  --  136 140  K 4.2  --  3.9 4.1  CL 99*  --  100* 104  CO2 26  --  19* 25  GLUCOSE 176*  --  314* 303*  BUN 23*  --  23* 23*  CREATININE 1.35* 1.58* 1.47* 1.08*  CALCIUM 9.6  --  8.2* 8.8*   Liver Function Tests: Recent Labs  Lab 02/23/17 1736  AST 30  ALT 27  ALKPHOS 105  BILITOT 0.7  PROT 6.8  ALBUMIN 3.2*   No results for input(s): LIPASE, AMYLASE in the last 168 hours. No results for input(s): AMMONIA in the last 168 hours. CBC: Recent Labs  Lab 02/23/17 1736 02/24/17 0100 02/24/17 0157 02/25/17 0214  WBC 22.0* 12.9* 13.9* 8.1  NEUTROABS 19.2*  --   --   --   HGB 13.0 10.7* 10.6* 10.2*   HCT 42.0 35.4* 35.6* 32.4*  MCV 91.7 92.9 93.0 89.3  PLT 316 264 258 241   Cardiac Enzymes: No results for input(s): CKTOTAL, CKMB, CKMBINDEX, TROPONINI in the last 168 hours. BNP: Invalid input(s): POCBNP CBG: Recent Labs  Lab 02/24/17 1232 02/24/17 1715 02/24/17 2151 02/25/17 0833 02/25/17 1132  GLUCAP 221* 405* 190* 351* 374*   D-Dimer No results for input(s): DDIMER in the last 72 hours. Hgb A1c No results for input(s): HGBA1C in the last 72 hours. Lipid Profile No results for input(s): CHOL, HDL, LDLCALC, TRIG, CHOLHDL, LDLDIRECT in the last 72 hours. Thyroid function studies No results for input(s): TSH, T4TOTAL, T3FREE, THYROIDAB in the last 72 hours.  Invalid input(s): FREET3 Anemia work up No results for input(s): VITAMINB12, FOLATE, FERRITIN, TIBC, IRON, RETICCTPCT in the last 72 hours. Urinalysis    Component Value Date/Time   COLORURINE YELLOW 02/24/2017 1330   APPEARANCEUR HAZY (A) 02/24/2017 1330   LABSPEC 1.018 02/24/2017 1330   PHURINE 6.0 02/24/2017 1330   GLUCOSEU >=500 (A) 02/24/2017 1330   GLUCOSEU 250 (A) 03/16/2015 1612   HGBUR NEGATIVE 02/24/2017 1330   BILIRUBINUR NEGATIVE 02/24/2017 1330   KETONESUR 80 (A) 02/24/2017 1330   PROTEINUR NEGATIVE 02/24/2017 1330   UROBILINOGEN 0.2 03/16/2015 1612   NITRITE NEGATIVE 02/24/2017 1330   LEUKOCYTESUR NEGATIVE 02/24/2017 1330   Sepsis Labs Invalid input(s): PROCALCITONIN,  WBC,  LACTICIDVEN   Time coordinating discharge: 32 minutes  SIGNED:  Marzetta Board, MD  [facility] 02/25/2017, 3:20 PM Pager [phone]  If 7PM-7AM, please contact night-coverage [email or web address] Password [email or web address]

## 2017-02-25 NOTE — Progress Notes (Signed)
CSW called to assist with patient DC home today.  Met with patient who states she usually takes [facility] to get home- CSW called [facility] and they cannot pick up since call made within 24 hours- pt not safe for normal bus or taxi.  Patient agreeable to [facility] home- confirmed address and that patient has key to house.  Patient will discharge to home Anticipated discharge date: 2/25 Family notified: pt to notify neighbor Transportation by [facility]- scheduled at [place] signing off.  Jorge Ny, LCSW Clinical Social Worker [phone]

## 2017-02-25 NOTE — Progress Notes (Signed)
Copy of AVS given to [facility] for transport purposes

## 2017-02-26 ENCOUNTER — Encounter (HOSPITAL_COMMUNITY): Payer: Self-pay | Admitting: *Deleted

## 2017-02-26 ENCOUNTER — Emergency Department (HOSPITAL_COMMUNITY): Payer: Medicare HMO

## 2017-02-26 ENCOUNTER — Emergency Department (HOSPITAL_COMMUNITY)
Admission: EM | Admit: 2017-02-26 | Discharge: 2017-02-26 | Disposition: A | Discharge status: Home / Self Care | Payer: Medicare HMO | Attending: Emergency Medicine | Admitting: Emergency Medicine

## 2017-02-26 ENCOUNTER — Other Ambulatory Visit: Payer: Self-pay | Admitting: Infectious Diseases

## 2017-02-26 DIAGNOSIS — E1122 Type 2 diabetes mellitus with diabetic chronic kidney disease: Secondary | ICD-10-CM | POA: Diagnosis not present

## 2017-02-26 DIAGNOSIS — I129 Hypertensive chronic kidney disease with stage 1 through stage 4 chronic kidney disease, or unspecified chronic kidney disease: Secondary | ICD-10-CM | POA: Diagnosis not present

## 2017-02-26 DIAGNOSIS — R531 Weakness: Secondary | ICD-10-CM | POA: Diagnosis not present

## 2017-02-26 DIAGNOSIS — Z794 Long term (current) use of insulin: Secondary | ICD-10-CM | POA: Insufficient documentation

## 2017-02-26 DIAGNOSIS — F17210 Nicotine dependence, cigarettes, uncomplicated: Secondary | ICD-10-CM | POA: Diagnosis not present

## 2017-02-26 DIAGNOSIS — J45909 Unspecified asthma, uncomplicated: Secondary | ICD-10-CM | POA: Insufficient documentation

## 2017-02-26 DIAGNOSIS — E1165 Type 2 diabetes mellitus with hyperglycemia: Secondary | ICD-10-CM | POA: Diagnosis not present

## 2017-02-26 DIAGNOSIS — E876 Hypokalemia: Secondary | ICD-10-CM | POA: Diagnosis not present

## 2017-02-26 DIAGNOSIS — R0602 Shortness of breath: Secondary | ICD-10-CM | POA: Diagnosis not present

## 2017-02-26 DIAGNOSIS — N183 Chronic kidney disease, stage 3 (moderate): Secondary | ICD-10-CM | POA: Diagnosis not present

## 2017-02-26 DIAGNOSIS — J441 Chronic obstructive pulmonary disease with (acute) exacerbation: Secondary | ICD-10-CM

## 2017-02-26 DIAGNOSIS — R739 Hyperglycemia, unspecified: Secondary | ICD-10-CM

## 2017-02-26 DIAGNOSIS — R404 Transient alteration of awareness: Secondary | ICD-10-CM | POA: Diagnosis not present

## 2017-02-26 DIAGNOSIS — Z79899 Other long term (current) drug therapy: Secondary | ICD-10-CM | POA: Diagnosis not present

## 2017-02-26 DIAGNOSIS — M545 Low back pain: Secondary | ICD-10-CM | POA: Diagnosis not present

## 2017-02-26 DIAGNOSIS — B20 Human immunodeficiency virus [HIV] disease: Secondary | ICD-10-CM

## 2017-02-26 LAB — CBC WITH DIFFERENTIAL/PLATELET
Basophils Absolute: 0 K/uL (ref 0.0–0.1)
Basophils Relative: 0 %
Eosinophils Absolute: 0 K/uL (ref 0.0–0.7)
Eosinophils Relative: 0 %
HCT: 37.8 % (ref 36.0–46.0)
Hemoglobin: 12.3 g/dL (ref 12.0–15.0)
Lymphocytes Relative: 9 %
Lymphs Abs: 1 K/uL (ref 0.7–4.0)
MCH: 28.5 pg (ref 26.0–34.0)
MCHC: 32.5 g/dL (ref 30.0–36.0)
MCV: 87.5 fL (ref 78.0–100.0)
Monocytes Absolute: 1.1 K/uL — ABNORMAL HIGH (ref 0.1–1.0)
Monocytes Relative: 10 %
Neutro Abs: 9.4 K/uL — ABNORMAL HIGH (ref 1.7–7.7)
Neutrophils Relative %: 81 %
Platelets: 316 K/uL (ref 150–400)
RBC: 4.32 MIL/uL (ref 3.87–5.11)
RDW: 19.6 % — ABNORMAL HIGH (ref 11.5–15.5)
WBC: 11.4 K/uL — ABNORMAL HIGH (ref 4.0–10.5)

## 2017-02-26 LAB — COMPREHENSIVE METABOLIC PANEL WITH GFR
ALT: 31 U/L (ref 14–54)
AST: 16 U/L (ref 15–41)
Albumin: 3.5 g/dL (ref 3.5–5.0)
Alkaline Phosphatase: 85 U/L (ref 38–126)
Anion gap: 14 (ref 5–15)
BUN: 20 mg/dL (ref 6–20)
CO2: 34 mmol/L — ABNORMAL HIGH (ref 22–32)
Calcium: 9.1 mg/dL (ref 8.9–10.3)
Chloride: 93 mmol/L — ABNORMAL LOW (ref 101–111)
Creatinine, Ser: 0.93 mg/dL (ref 0.44–1.00)
GFR calc Af Amer: >60 mL/min
GFR calc non Af Amer: >60 mL/min
Glucose, Bld: 300 mg/dL — ABNORMAL HIGH (ref 65–99)
Potassium: 2.8 mmol/L — ABNORMAL LOW (ref 3.5–5.1)
Sodium: 141 mmol/L (ref 135–145)
Total Bilirubin: 0.9 mg/dL (ref 0.3–1.2)
Total Protein: 6.9 g/dL (ref 6.5–8.1)

## 2017-02-26 LAB — I-STAT TROPONIN, ED: Troponin i, poc: 0.01 ng/mL (ref 0.00–0.08)

## 2017-02-26 LAB — URINALYSIS, ROUTINE W REFLEX MICROSCOPIC
Bacteria, UA: NONE SEEN
Bilirubin Urine: NEGATIVE
Glucose, UA: >=500 mg/dL — AB
Hgb urine dipstick: NEGATIVE
Ketones, ur: 20 mg/dL — AB
Leukocytes, UA: NEGATIVE
Nitrite: NEGATIVE
Protein, ur: 30 mg/dL — AB
Specific Gravity, Urine: 1.012 (ref 1.005–1.030)
pH: 8 (ref 5.0–8.0)

## 2017-02-26 LAB — CULTURE, BLOOD (ROUTINE X 2): SPECIAL REQUESTS: ADEQUATE

## 2017-02-26 LAB — LIPASE, BLOOD: LIPASE: 18 U/L (ref 11–51)

## 2017-02-26 MED ORDER — SODIUM CHLORIDE 0.9 % IJ SOLN
INTRAMUSCULAR | Status: AC
  Filled 2017-02-26: qty 50

## 2017-02-26 MED ORDER — PREDNISONE 20 MG PO TABS
60.0000 mg | ORAL_TABLET | Freq: Once | ORAL | Status: AC
Start: 2017-02-26 — End: 2017-02-26
  Administered 2017-02-26: 60 mg via ORAL
  Filled 2017-02-26: qty 3

## 2017-02-26 MED ORDER — ALBUTEROL SULFATE (2.5 MG/3ML) 0.083% IN NEBU
5.0000 mg | INHALATION_SOLUTION | Freq: Once | RESPIRATORY_TRACT | Status: AC
  Administered 2017-02-26: 5 mg via RESPIRATORY_TRACT
  Filled 2017-02-26: qty 6

## 2017-02-26 MED ORDER — POTASSIUM CHLORIDE 10 MEQ/100ML IV SOLN
10.0000 meq | Freq: Once | INTRAVENOUS | Status: AC
  Administered 2017-02-26: 10 meq via INTRAVENOUS
  Filled 2017-02-26: qty 100

## 2017-02-26 MED ORDER — DOXYCYCLINE HYCLATE 100 MG PO TABS
100.0000 mg | ORAL_TABLET | Freq: Once | ORAL | Status: AC
  Administered 2017-02-26: 100 mg via ORAL
  Filled 2017-02-26: qty 1

## 2017-02-26 MED ORDER — IOPAMIDOL (ISOVUE-370) INJECTION 76%
INTRAVENOUS | Status: AC
  Administered 2017-02-26: 100 mL via INTRAVENOUS
  Filled 2017-02-26: qty 100

## 2017-02-26 MED ORDER — POTASSIUM CHLORIDE ER 10 MEQ PO TBCR: 10.0000 meq | EXTENDED_RELEASE_TABLET | Freq: Every day | ORAL | 0 refills | Status: DC

## 2017-02-26 MED ORDER — IPRATROPIUM-ALBUTEROL 0.5-2.5 (3) MG/3ML IN SOLN
3.0000 mL | Freq: Once | RESPIRATORY_TRACT | Status: AC
Start: 2017-02-26 — End: 2017-02-26
  Administered 2017-02-26: 3 mL via RESPIRATORY_TRACT
  Filled 2017-02-26: qty 3

## 2017-02-26 MED ORDER — IPRATROPIUM-ALBUTEROL 0.5-2.5 (3) MG/3ML IN SOLN: 3.0000 mL | Freq: Once | RESPIRATORY_TRACT | Status: DC

## 2017-02-26 NOTE — ED Notes (Signed)
Pt aware urine sample needed but unable to provide urine sample at this time.

## 2017-02-26 NOTE — ED Notes (Signed)
[facility] called for Transport 

## 2017-02-26 NOTE — ED Notes (Signed)
RN went back into room after starting breathing tx. Pt not even attempting to hold it up to her mouth. Pt prompted to hold it to her mouth and breathe in the medicine, pt not attempting to do so. After multiple promptings, pt put it up to her mouth.

## 2017-02-26 NOTE — Discharge Instructions (Signed)
Please follow up with your doctor °Return if worsening °

## 2017-02-26 NOTE — Progress Notes (Signed)
Patient refuses ABG. RN aware and will let MD know

## 2017-02-26 NOTE — ED Notes (Signed)
Patient reports she uses 3L of continuous oxygen. She was offered a transporation ride back via [facility] (which were at bedside during this conversation) with oxygen. Patient is aware that she needs oxygen during transportation. However, she is requesting her friend take her back home with no oxygen source. Patient is alert, oriented x 4, and currently in no acute respiratory distress.

## 2017-02-26 NOTE — ED Triage Notes (Signed)
Per EMS, pt frome home, was dx with the flu and PNA Saturday in the ED.  Not feeling better.  Reports SOB with rhonchi per EMS.  She also reports back pain.

## 2017-02-26 NOTE — ED Provider Notes (Signed)
[facility] DEPT Provider Note   CSN: [id number] Arrival date & time: 02/26/17  1510     History   Chief Complaint Chief Complaint  Patient presents with  . Shortness of Breath    HPI [name] is a 52 y.o. female with extensive PMH who presents with SOB. PMH significant for COPD, current smoker, IDDM, CKD, obesity, HIV, recurrent falls, L BKA. She states she was discharged from the hospital yesterday for a COPD exacerbation. CXR and flu test were negative. She felt better and was discharged home. This morning she began to feel worse. She reports ongoing SOB and cough. She also has generalized abdominal pain and states she feels hungry. She had an episode of N/V when she tried to eat a potato this morning. She also had a fall yesterday when she tried to stand up but felt so weak that her legs gave out on her. She landed on her left side and has low back pain. She called EMS who gave her breathing tx and convinced her not to come to the hospital. She called again because she still felt bad and was transported. She reports adherence to Doxy and steroids since being discharged. She denies fever, chills, URI symptoms, current nausea, diarrhea, urinary symptoms.  HPI  Past Medical History:  Diagnosis Date  . Acute metabolic encephalopathy 07/02/6376  . Acute on chronic respiratory failure with hypoxia ([place]) 05/19/2016  . Anxiety   . ARF (acute renal failure) ([place]) 05/03/2014  . Arthritis    LEFT HIP  . Asthma    HOSPITALIZED WITH EXCERBATION OF ASTHMA - AND BRONCHITIS AND THE FLU DEC 2013  . Asthma exacerbation attacks 12/28/2011  . Bronchitis 12/28/2011  . CAP (community acquired pneumonia) 07/25/2012  . Chronic kidney disease (CKD), stage IV (severe) ([place]) 11/10/2014  . Class 3 obesity due to excess calories with serious comorbidity and body mass index (BMI) of 40.0 to 44.9 in adult   . Colles' fracture of left radius   . Depression   . Diabetes  mellitus    ON INSULIN AND ORAL MEDICATIONS  . Diabetes mellitus type 2 with complications, uncontrolled ([place]) 04/19/2008   Qualifier: Diagnosis of  By: Tomma Lightning MD, Claiborne Billings     . Diabetic foot infection ([place]) 11/09/2014  . Diabetic ketoacidosis without coma associated with type 2 diabetes mellitus ([place])   . Diabetic neuropathy ([place]) 11/10/2014  . Diarrhea 08/30/2008   Qualifier: Diagnosis of  By: Tomma Lightning MD, Claiborne Billings    . Diastolic dysfunction 58/08/5025  . Difficult intravenous access   . DKA, type 2 ([place]) 04/04/2016  . Dyslipidemia 12/23/2008   Qualifier: Diagnosis of  By: Tomma Lightning MD, Claiborne Billings    . Essential hypertension 04/19/2008   Qualifier: Diagnosis of  By: Tomma Lightning MD, Claiborne Billings    . FATIGUE 07/12/2008   Qualifier: Diagnosis of  By: Tomma Lightning MD, Claiborne Billings    . Femoral neck fracture ([place]) 07/16/2011  . Femur fracture, left ([place]) 07/08/2014  . Fever   . Fracture of distal femur ([place]) 07/08/2014  . GERD (gastroesophageal reflux disease)   . Hereditary and idiopathic peripheral neuropathy 04/19/2008   Qualifier: Diagnosis of  By: Tomma Lightning MD, Claiborne Billings    . Hip fracture, left ([place]) 07/16/2011  . Hip pain   . HIV infection ([place]) 2000  . Human immunodeficiency virus (HIV) disease ([place]) 04/19/2008   [id number] +   . Hyperlipidemia   . Hypertension   . Hyponatremia 12/29/2011  . Influenza A 12/29/2011  . Influenza  B   . Insomnia 06/12/2016  . Left hip pain 05/03/2014  . Mood disorder ([name]) 04/19/2008   Qualifier: Diagnosis of  By: Vollmer MD, Kelly    . Neuropathy    NEUROPATHY HANDS AND FEET  . NSTEMI (non-ST elevated myocardial infarction) ([name]) 05/19/2016  . Obesity hypoventilation syndrome ([name])   . Osteoporosis 07/08/2014  . Pain    SEVERE PAIN LEFT HIP - HX OF LEFT HIP PINNING JULY 2013  . Perimenopausal symptoms 02/13/2012   LMP around 2011. On estrace and provera since around 2012 for hot flashes, mood swings. Estrace 2 mg daily, Provera 2.5 mg for 5 days each month.   . Repeated falls 07/30/2011  . Sepsis  ([name]) 04/04/2016  . Shortness of breath    ALLERGIES ARE "ACTING UP"  . SOB (shortness of breath)   . THRUSH 05/20/2008   Qualifier: Diagnosis of  By: Vollmer MD, Kelly      . Tobacco use disorder 04/14/2010    Patient Active Problem List   Diagnosis Date Noted  . COPD exacerbation ([name]) 02/23/2017  . Acute on chronic respiratory failure with hypoxia and hypercapnia ([name]) 08/19/2016  . Leukocytosis 08/19/2016  . CKD (chronic kidney disease), stage III ([name]) 08/19/2016  . Altered mental status 08/19/2016  . Asthma exacerbation 06/18/2016  . Insomnia 06/12/2016  . Difficult intravenous access   . Acute on chronic respiratory failure with hypoxia ([name]) 05/19/2016  . NSTEMI (non-ST elevated myocardial infarction) ([name]) 05/19/2016  . Acute metabolic encephalopathy 05/19/2016  . Hyperlipidemia   . Class 3 obesity due to excess calories with serious comorbidity and body mass index (BMI) of 40.0 to 44.9 in adult   . Obesity hypoventilation syndrome ([name])   . Diastolic dysfunction 11/10/2014  . Diabetic neuropathy ([name]) 11/10/2014  . Fracture of distal femur ([name]) 07/08/2014  . Osteoporosis 07/08/2014  . Femur fracture, left ([name]) 07/08/2014  . Colles' fracture of left radius   . Left hip pain 05/03/2014  . Perimenopausal symptoms 02/13/2012  . Hyponatremia 12/29/2011  . Asthma exacerbation attacks 12/28/2011  . Bronchitis 12/28/2011  . Repeated falls 07/30/2011  . Hip fracture, left ([name]) 07/16/2011  . Femoral neck fracture ([name]) 07/16/2011  . Dyslipidemia 12/23/2008  . THRUSH 05/20/2008  . Human immunodeficiency virus (HIV) disease ([name]) 04/19/2008  . Diabetes mellitus type 2 with complications, uncontrolled ([name]) 04/19/2008  . Mood disorder ([name]) 04/19/2008  . Hereditary and idiopathic peripheral neuropathy 04/19/2008  . Essential hypertension 04/19/2008    Past Surgical History:  Procedure Laterality Date  . AMPUTATION Left 11/18/2014   Procedure: LEFT BELOW KNEE AMPUTATION;   Surgeon: Naiping M Xu, MD;  Location: [facility];  Service: Orthopedics;  Laterality: Left;  . CAST APPLICATION Left 05/05/2014   Procedure: CAST APPLICATION (FIBERGLASS);  Surgeon: Ronald Gioffre, MD;  Location: [facility];  Service: Orthopedics;  Laterality: Left;  CLOSED REDUCTION OF LEFT COLLES FRACTURE WITH SHORT ARM CAST  . HARDWARE REMOVAL Left 05/05/2014   Procedure: HARDWARE REMOVAL LEFT HIP;  Surgeon: Ronald Gioffre, MD;  Location: [facility];  Service: Orthopedics;  Laterality: Left;  REMOVAL BIOMET 6.5-8.0 CANNULATED SCREW  . HIP ARTHROPLASTY Left 05/05/2014   Procedure: ARTHROPLASTY OPEN REDUCTION INTERNAL FIXATION LEFT HIP AND REMOVAL OF TWO CANNULATED SCREW;  Surgeon: Ronald Gioffre, MD;  Location: [facility];  Service: Orthopedics;  Laterality: Left;  . HIP PINNING,CANNULATED  07/16/2011   Procedure: CANNULATED HIP PINNING;  Surgeon: Frank V Aluisio, MD;  Location: [facility];  Service: Orthopedics;  Laterality: Left;  .   HIP PINNING,CANNULATED Left 04/09/2012   Procedure: CANNULATED HIP PINNING AND HARDWARE REVISION;  Surgeon: Gearlean Alf, MD;  Location: [facility];  Service: Orthopedics;  Laterality: Left;  . I&D EXTREMITY Left 11/16/2014   Procedure:  DEBRIDEMENT OF LEFT FOOT POSSIBLE APPLICATION OF INTEGRIA AND VAC ;  Surgeon: Irene Limbo, MD;  Location: [facility];  Service: Plastics;  Laterality: Left;  . PERIPHERAL VASCULAR CATHETERIZATION N/A 11/15/2014   Procedure: Abdominal Aortogram;  Surgeon: Conrad [place], MD;  Location: [facility];  Service: Cardiovascular;  Laterality: N/A;  . PERIPHERAL VASCULAR CATHETERIZATION  11/15/2014   Procedure: Lower Extremity Angiography;  Surgeon: Conrad [place], MD;  Location: [facility];  Service: Cardiovascular;;  . PERIPHERAL VASCULAR CATHETERIZATION Left 11/15/2014   Procedure: Peripheral Vascular Intervention;  Surgeon: Conrad Topton, MD;  Location: [facility];  Service: Cardiovascular;  Laterality: Left;  popliteal artery stenting    OB  History    Gravida Para Term Preterm AB Living   _0 0 1 1   SAB TAB Ectopic Multiple Live Births   1   0 0         Home Medications    Prior to Admission medications   Medication Sig Start Date End Date Taking? Authorizing Provider  albuterol (PROAIR HFA) 108 (90 Base) MCG/ACT inhaler Inhale 2 puffs into the lungs every 6 (six) hours as needed for wheezing or shortness of breath. 11/28/16  Yes Chesley Mires, MD  ARIPiprazole (ABILIFY) 10 MG tablet Take 10 mg by mouth every morning. 02/13/17  Yes [provider]  clonazePAM (KLONOPIN) 1 MG tablet Take 1 mg by mouth 3 (three) times daily as needed for anxiety. 02/13/17  Yes [provider]  diphenoxylate-atropine (LOMOTIL) 2.5-0.025 MG tablet Take 1 tablet by mouth 4 (four) times daily as needed for diarrhea or loose stools. 11/20/16  Yes Campbell Riches, MD  doxepin (SINEQUAN) 10 MG capsule Take 20 mg by mouth at bedtime. 02/13/17  Yes [provider]  doxycycline (VIBRAMYCIN) 100 MG capsule Take 1 capsule (100 mg total) by mouth 2 (two) times daily. 02/25/17  Yes Gherghe, Vella Redhead, MD  DULoxetine (CYMBALTA) 60 MG capsule Take 60 mg by mouth daily. 08/16/16  Yes [provider]  fenofibrate (TRICOR) 145 MG tablet Take 145 mg by mouth daily.   Yes [provider]  fluticasone furoate-vilanterol (BREO ELLIPTA) 100-25 MCG/INH AEPB Inhale 1 puff into the lungs daily. 11/28/16  Yes Chesley Mires, MD  furosemide (LASIX) 80 MG tablet Take 80 mg by mouth daily. 02/10/17  Yes [provider]  GENVOYA 150-150-200-10 MG TABS tablet TAKE 1 TABLET BY MOUTH EVERY DAY 02/26/17  Yes Campbell Riches, MD  HYDROcodone-acetaminophen (NORCO) 10-325 MG tablet Take 1 tablet by mouth every 8 (eight) hours as needed (pain).   Yes [provider]  Insulin Glargine (TOUJEO SOLOSTAR) 300 UNIT/ML SOPN Inject 65 Units into the skin daily. 02/11/17  Yes Elayne Snare, MD  insulin regular human CONCENTRATED  (HUMULIN R U-500 KWIKPEN) 500 UNIT/ML kwikpen INJECT 60 UNITS BEFORE BREAKFAST, 50 UNITS BEFORE LUNCH, AND 35 UNITS BEFORE DINNER. 02/11/17  Yes Elayne Snare, MD  liraglutide (VICTOZA) 18 MG/3ML SOPN INJECT 1.8 MG INTO THE SKIN DAILY 02/11/17  Yes Elayne Snare, MD  MAGNESIUM-OXIDE 400 (241.3 Mg) MG tablet Take 400 mg by mouth daily. 02/15/17  Yes [provider]  pantoprazole (PROTONIX) 40 MG tablet Take 40 mg by mouth daily. 09/02/14  Yes  [provider]  potassium chloride SA (K-DUR,KLOR-CON) 20 MEQ tablet Take 1 tablet (20 mEq total) by mouth daily. X 3 days, then check potassium levels if needed to continue Patient taking differently: Take 20 mEq by mouth daily.  05/25/16  Yes Rai, Ripudeep K, MD  predniSONE (DELTASONE) 10 MG tablet Take 4 tablets (40 mg total) by mouth daily. 4 tablets daily x 4 days then 3 daily x 4 days then 2 daily x 4 days then 1 daily x 4 days 02/25/17  Yes Gherghe, Vella Redhead, MD  pregabalin (LYRICA) 225 MG capsule Take 225 mg by mouth 2 (two) times daily.   Yes [provider]  rosuvastatin (CRESTOR) 20 MG tablet Take 20 mg by mouth daily. Reported on 05/02/2015 09/02/14  Yes [provider]  SYMBICORT 80-4.5 MCG/ACT inhaler Inhale 2 puffs into the lungs 2 (two) times daily. 02/26/17  Yes [provider]  temazepam (RESTORIL) 15 MG capsule Take 15 mg by mouth at bedtime as needed for sleep. 02/13/17  Yes [provider]  tiZANidine (ZANAFLEX) 4 MG tablet Take 4 mg by mouth 2 (two) times daily.  03/06/16  Yes [provider]  ACCU-CHEK AVIVA PLUS test strip TEST BLOOD SUGAR THREE TIMES DAILY AS DIRECTED 11/28/16   Elayne Snare, MD  aspirin EC 81 MG EC tablet Take 1 tablet (81 mg total) by mouth daily. Patient not taking: Reported on 02/26/2017 05/25/16   Rai, Vernelle Emerald, MD  Blood Glucose Monitoring Suppl (ACCU-CHEK AVIVA PLUS) w/Device KIT USE TO TEST BLOOD SUGAR DAILY 07/03/16   Elayne Snare, MD  [facility] 4 MG/0.1ML LIQD nasal spray  kit Place 1 spray into the nose as needed. overdose 02/01/17   [provider]    Family History Family History  Problem Relation Age of Onset  . Diabetes Mother   . Hypertension Mother   . Vision loss Mother   . Heart disease Mother   . Hypertension Father   . Thyroid disease Neg Hx     Social History Social History   Tobacco Use  . Smoking status: Current Every Day Smoker    Packs/day: 0.50    Years: 35.00    Pack years: 17.50    Types: E-cigarettes, Cigarettes  . Smokeless tobacco: Never Used  Substance Use Topics  . Alcohol use: No    Alcohol/week: 0.0 oz    Comment: no drink since 2008  . Drug use: No    Comment: hx of crack/cocaine, clean 8 years     Allergies   Cortizone-10 [hydrocortisone]   Review of Systems Review of Systems  Constitutional: Negative for fever.  HENT: Positive for rhinorrhea and sore throat. Negative for congestion.   Respiratory: Positive for cough, shortness of breath and wheezing.   Cardiovascular: Negative for chest pain.  Gastrointestinal: Positive for abdominal pain, nausea and vomiting. Negative for diarrhea.  Genitourinary: Negative for dysuria.  Neurological: Positive for weakness (generalized).  All other systems reviewed and are negative.    Physical Exam Updated Vital Signs BP (!) 165/96 (BP Location: Right Arm)   Pulse 77   Temp 98.2 F (36.8 C) (Oral)   Resp 18   LMP 11/16/2010   SpO2 100%   Physical Exam  Constitutional: She is oriented to person, place, and time. No distress.  Chronically ill appearing  HENT:  Head: Normocephalic and atraumatic.  Eyes: Conjunctivae are normal. Pupils are equal, round, and reactive to light. Right eye exhibits no discharge. Left eye exhibits no discharge.  No scleral icterus.  Neck: Normal range of motion.  Cardiovascular: Normal rate and regular rhythm.  Pulmonary/Chest: Effort normal. No respiratory distress. She has wheezes (diffuse inspiratory and expiratory  wheezes).  Abdominal: Soft. Bowel sounds are normal. She exhibits no distension. There is tenderness (generalized).  Musculoskeletal:  No obvious signs of trauma on the back  L BKA  Neurological: She is alert and oriented to person, place, and time.  Skin: Skin is warm and dry.  Psychiatric: Her behavior is normal. Her affect is blunt.  Nursing note and vitals reviewed.    ED Treatments / Results  Labs (all labs ordered are listed, but only abnormal results are displayed) Labs Reviewed  CBC WITH DIFFERENTIAL/PLATELET - Abnormal; Notable for the following components:      Result Value   WBC 11.4 (*)    RDW 19.6 (*)    Neutro Abs 9.4 (*)    Monocytes Absolute 1.1 (*)    All other components within normal limits  COMPREHENSIVE METABOLIC PANEL - Abnormal; Notable for the following components:   Potassium 2.8 (*)    Chloride 93 (*)    CO2 34 (*)    Glucose, Bld 300 (*)    All other components within normal limits  URINALYSIS, ROUTINE W REFLEX MICROSCOPIC - Abnormal; Notable for the following components:   Glucose, UA >=500 (*)    Ketones, ur 20 (*)    Protein, ur 30 (*)    Squamous Epithelial / LPF 0-5 (*)    All other components within normal limits  LIPASE, BLOOD  I-STAT TROPONIN, ED    EKG  EKG Interpretation  Date/Time:  Tuesday February 26 2017 15:39:22 EST Ventricular Rate:  90 PR Interval:    QRS Duration: 94 QT Interval:  393 QTC Calculation: 481 R Axis:   6 Text Interpretation:  Sinus rhythm Left atrial enlargement Probable left ventricular hypertrophy Nonspecific T abnormalities, anterior leads No significant change since last tracing Confirmed by Knapp, Jon ([id number]) on 02/26/2017 4:20:20 PM       Radiology Dg Chest 2 View  Result Date: 02/26/2017 CLINICAL DATA:  Shortness of breath. The patient was diagnosed with the flu and pneumonia 02/23/2017. EXAM: CHEST  2 VIEW COMPARISON:  Single-view of the chest 02/23/2017, 09/03/2016 and 05/20/2016. CT chest  05/23/2016. FINDINGS: There is cardiomegaly without edema. Lungs are clear. No pneumothorax or pleural effusion. No acute bony abnormality. IMPRESSION: Cardiomegaly without acute disease. Electronically Signed   By: Thomas  Dalessio M.D.   On: 02/26/2017 17:14   Dg Lumbar Spine Complete  Result Date: 02/26/2017 CLINICAL DATA:  Worsening of chronic back pain EXAM: LUMBAR SPINE - COMPLETE 4+ VIEW COMPARISON:  CT 05/03/2014 radiograph 08/11/2013 FINDINGS: Lumbar alignment within normal limits. Moderate narrowing, vacuum disc at L5-S1. Mild degenerative changes at L1-L2. Vertebral body heights are normal. Aortic atherosclerosis. Mild scoliosis. IMPRESSION: No acute osseous abnormality. Multilevel degenerative changes most notable at L5-S1. Electronically Signed   By: Kim  Fujinaga M.D.   On: 02/26/2017 18:31   Ct Angio Chest Pe W/cm &/or Wo Cm  Result Date: 02/26/2017 CLINICAL DATA:  Shortness of Breath EXAM: CT ANGIOGRAPHY CHEST WITH CONTRAST TECHNIQUE: Multidetector CT imaging of the chest was performed using the standard protocol during bolus administration of intravenous contrast. Multiplanar CT image reconstructions and MIPs were obtained to evaluate the vascular anatomy. CONTRAST:  100mL ISOVUE-370 IOPAMIDOL (ISOVUE-370) INJECTION 76% COMPARISON:  02/26/2017 FINDINGS: Cardiovascular: No filling defects in the pulmonary arteries to suggest pulmonary emboli. Heart is mildly enlarged.   Aorta is normal caliber. Coronary artery calcifications scattered throughout the major coronary vessels. Mediastinum/Nodes: No mediastinal, hilar, or axillary adenopathy. Trachea and esophagus are unremarkable. Lungs/Pleura: Scattered ground-glass opacities in the mid and upper lungs, likely small areas of inflammation/alveolitis. Mild emphysema. No confluent airspace opacities. No effusions. Upper Abdomen: Imaging into the upper abdomen shows no acute findings. Musculoskeletal: No acute bony abnormality. Review of the MIP  images confirms the above findings. IMPRESSION: No evidence of pulmonary embolus. Scattered coronary artery calcifications.  Mild cardiomegaly. Minimal scattered ground-glass areas in the lungs, likely areas of small airways disease/alveolitis. Emphysema (ICD10-J43.9). Electronically Signed   By: Kevin  Dover M.D.   On: 02/26/2017 20:11    Procedures Procedures (including critical care time)  Medications Ordered in ED Medications  sodium chloride 0.9 % injection (not administered)  albuterol (PROVENTIL) (2.5 MG/3ML) 0.083% nebulizer solution 5 mg (5 mg Nebulization Given 02/26/17 1739)  predniSONE (DELTASONE) tablet 60 mg (60 mg Oral Given 02/26/17 1822)  ipratropium-albuterol (DUONEB) 0.5-2.5 (3) MG/3ML nebulizer solution 3 mL (3 mLs Nebulization Given 02/26/17 1924)  potassium chloride 10 mEq in 100 mL IVPB (0 mEq Intravenous Stopped 02/26/17 2109)  doxycycline (VIBRA-TABS) tablet 100 mg (100 mg Oral Given 02/26/17 2022)  iopamidol (ISOVUE-370) 76 % injection (100 mLs Intravenous Contrast Given 02/26/17 1945)     Initial Impression / Assessment and Plan / ED Course  I have reviewed the triage vital signs and the nursing notes.  Pertinent labs & imaging results that were available during my care of the patient were reviewed by me and considered in my medical decision making (see chart for details).  51 year old female presents with worsening SOB and wheezing since being discharged yesterday. She is hypertensive but otherwise vitals are normal. She was placed on 3L O2 for comfort but is not hypoxic on RA. Exam is remarkable for diffuse inspiratory and expiratory wheezing. She also has generalized tenderness of her abdomen but it is soft. No obvious injury was noted to her back and she does not appear in a significant amount of pain. Labs, EKG, CXR were ordered as well as breathing tx, steroids, dose of Doxy.  CBC is remarkable for mild leukocytosis (11.4) which is likely reactive due to steroid  use. CMP is remarkable for hypokalemia (2.8), low Cl (93), high bicarb (34), hyperglycemia (300). Lipase is 18. UA is remarkable for >500 glucose, 20 ketones, 30 protein. Trop is 0.01. EKG is SR. CXR is negative. Lumbar xray is negative. After a breathing tx, she states she does not feel better. Will order another tx and reassess. Discussed with Dr. Schlossman, will also add CTA of chest to r/o PE since she has recurrent symptoms.  CTA is negative for PE. After second tx she is requesting d/c. She states she can do all the same medicines that we are doing at home. She is alert, oriented, feels better, and is not hypoxic on RA. She states she also has potassium at home to take. She has a f/u appt with her doctor next week. Return precautions were given.  Final Clinical Impressions(s) / ED Diagnoses   Final diagnoses:  COPD exacerbation ([name])  Hypokalemia  Hyperglycemia    ED Discharge Orders    None       Gekas, Kelly Marie, PA-C 02/26/17 2324    Schlossman, Erin, MD 02/28/17 1721  

## 2017-02-28 DIAGNOSIS — Z79899 Other long term (current) drug therapy: Secondary | ICD-10-CM | POA: Diagnosis not present

## 2017-02-28 DIAGNOSIS — M545 Low back pain: Secondary | ICD-10-CM | POA: Diagnosis not present

## 2017-02-28 DIAGNOSIS — G8929 Other chronic pain: Secondary | ICD-10-CM | POA: Diagnosis not present

## 2017-03-01 LAB — CULTURE, BLOOD (ROUTINE X 2): CULTURE: NO GROWTH

## 2017-03-02 ENCOUNTER — Encounter (HOSPITAL_COMMUNITY): Payer: Self-pay | Admitting: Emergency Medicine

## 2017-03-02 ENCOUNTER — Observation Stay (HOSPITAL_COMMUNITY)
Admission: EM | Admit: 2017-03-02 | Discharge: 2017-03-04 | Disposition: A | Discharge status: Home / Self Care | Payer: Medicare HMO | Attending: Internal Medicine | Admitting: Internal Medicine

## 2017-03-02 ENCOUNTER — Emergency Department (HOSPITAL_COMMUNITY): Payer: Medicare HMO

## 2017-03-02 ENCOUNTER — Other Ambulatory Visit: Payer: Self-pay

## 2017-03-02 DIAGNOSIS — J440 Chronic obstructive pulmonary disease with acute lower respiratory infection: Secondary | ICD-10-CM | POA: Insufficient documentation

## 2017-03-02 DIAGNOSIS — M62838 Other muscle spasm: Secondary | ICD-10-CM | POA: Diagnosis not present

## 2017-03-02 DIAGNOSIS — E1122 Type 2 diabetes mellitus with diabetic chronic kidney disease: Secondary | ICD-10-CM | POA: Diagnosis not present

## 2017-03-02 DIAGNOSIS — E1110 Type 2 diabetes mellitus with ketoacidosis without coma: Principal | ICD-10-CM | POA: Diagnosis present

## 2017-03-02 DIAGNOSIS — E860 Dehydration: Secondary | ICD-10-CM | POA: Diagnosis not present

## 2017-03-02 DIAGNOSIS — R0602 Shortness of breath: Secondary | ICD-10-CM

## 2017-03-02 DIAGNOSIS — I959 Hypotension, unspecified: Secondary | ICD-10-CM | POA: Insufficient documentation

## 2017-03-02 DIAGNOSIS — E1140 Type 2 diabetes mellitus with diabetic neuropathy, unspecified: Secondary | ICD-10-CM | POA: Insufficient documentation

## 2017-03-02 DIAGNOSIS — Z794 Long term (current) use of insulin: Secondary | ICD-10-CM | POA: Diagnosis not present

## 2017-03-02 DIAGNOSIS — R5381 Other malaise: Secondary | ICD-10-CM | POA: Insufficient documentation

## 2017-03-02 DIAGNOSIS — B20 Human immunodeficiency virus [HIV] disease: Secondary | ICD-10-CM | POA: Diagnosis not present

## 2017-03-02 DIAGNOSIS — E1165 Type 2 diabetes mellitus with hyperglycemia: Secondary | ICD-10-CM | POA: Diagnosis not present

## 2017-03-02 DIAGNOSIS — R069 Unspecified abnormalities of breathing: Secondary | ICD-10-CM | POA: Diagnosis not present

## 2017-03-02 DIAGNOSIS — N179 Acute kidney failure, unspecified: Secondary | ICD-10-CM | POA: Diagnosis not present

## 2017-03-02 DIAGNOSIS — G4700 Insomnia, unspecified: Secondary | ICD-10-CM | POA: Diagnosis not present

## 2017-03-02 DIAGNOSIS — Z9181 History of falling: Secondary | ICD-10-CM | POA: Insufficient documentation

## 2017-03-02 DIAGNOSIS — J449 Chronic obstructive pulmonary disease, unspecified: Secondary | ICD-10-CM | POA: Diagnosis present

## 2017-03-02 DIAGNOSIS — I252 Old myocardial infarction: Secondary | ICD-10-CM | POA: Insufficient documentation

## 2017-03-02 DIAGNOSIS — F17210 Nicotine dependence, cigarettes, uncomplicated: Secondary | ICD-10-CM | POA: Insufficient documentation

## 2017-03-02 DIAGNOSIS — I129 Hypertensive chronic kidney disease with stage 1 through stage 4 chronic kidney disease, or unspecified chronic kidney disease: Secondary | ICD-10-CM | POA: Insufficient documentation

## 2017-03-02 DIAGNOSIS — K219 Gastro-esophageal reflux disease without esophagitis: Secondary | ICD-10-CM | POA: Insufficient documentation

## 2017-03-02 DIAGNOSIS — F39 Unspecified mood [affective] disorder: Secondary | ICD-10-CM | POA: Insufficient documentation

## 2017-03-02 DIAGNOSIS — M810 Age-related osteoporosis without current pathological fracture: Secondary | ICD-10-CM | POA: Insufficient documentation

## 2017-03-02 DIAGNOSIS — F329 Major depressive disorder, single episode, unspecified: Secondary | ICD-10-CM | POA: Insufficient documentation

## 2017-03-02 DIAGNOSIS — E785 Hyperlipidemia, unspecified: Secondary | ICD-10-CM | POA: Insufficient documentation

## 2017-03-02 DIAGNOSIS — F419 Anxiety disorder, unspecified: Secondary | ICD-10-CM | POA: Insufficient documentation

## 2017-03-02 DIAGNOSIS — Z7982 Long term (current) use of aspirin: Secondary | ICD-10-CM | POA: Insufficient documentation

## 2017-03-02 DIAGNOSIS — E1151 Type 2 diabetes mellitus with diabetic peripheral angiopathy without gangrene: Secondary | ICD-10-CM | POA: Insufficient documentation

## 2017-03-02 DIAGNOSIS — J209 Acute bronchitis, unspecified: Secondary | ICD-10-CM | POA: Insufficient documentation

## 2017-03-02 DIAGNOSIS — Z9981 Dependence on supplemental oxygen: Secondary | ICD-10-CM | POA: Insufficient documentation

## 2017-03-02 DIAGNOSIS — Z7951 Long term (current) use of inhaled steroids: Secondary | ICD-10-CM | POA: Insufficient documentation

## 2017-03-02 DIAGNOSIS — Z79899 Other long term (current) drug therapy: Secondary | ICD-10-CM | POA: Insufficient documentation

## 2017-03-02 DIAGNOSIS — J9611 Chronic respiratory failure with hypoxia: Secondary | ICD-10-CM | POA: Diagnosis not present

## 2017-03-02 DIAGNOSIS — N184 Chronic kidney disease, stage 4 (severe): Secondary | ICD-10-CM | POA: Insufficient documentation

## 2017-03-02 DIAGNOSIS — E662 Morbid (severe) obesity with alveolar hypoventilation: Secondary | ICD-10-CM | POA: Diagnosis not present

## 2017-03-02 DIAGNOSIS — Z89512 Acquired absence of left leg below knee: Secondary | ICD-10-CM | POA: Insufficient documentation

## 2017-03-02 DIAGNOSIS — R739 Hyperglycemia, unspecified: Secondary | ICD-10-CM

## 2017-03-02 DIAGNOSIS — R531 Weakness: Secondary | ICD-10-CM | POA: Diagnosis not present

## 2017-03-02 DIAGNOSIS — Z6830 Body mass index (BMI) 30.0-30.9, adult: Secondary | ICD-10-CM | POA: Insufficient documentation

## 2017-03-02 LAB — BASIC METABOLIC PANEL
Anion gap: 14 (ref 5–15)
Anion gap: 16 — ABNORMAL HIGH (ref 5–15)
BUN: 23 mg/dL — AB (ref 6–20)
BUN: 20 mg/dL (ref 6–20)
CO2: 25 mmol/L (ref 22–32)
CO2: 23 mmol/L (ref 22–32)
CREATININE: 1.21 mg/dL — AB (ref 0.44–1.00)
CREATININE: 1.07 mg/dL — AB (ref 0.44–1.00)
Calcium: 9.1 mg/dL (ref 8.9–10.3)
Calcium: 9 mg/dL (ref 8.9–10.3)
Chloride: 91 mmol/L — ABNORMAL LOW (ref 101–111)
Chloride: 92 mmol/L — ABNORMAL LOW (ref 101–111)
GFR calc Af Amer: 59 mL/min — ABNORMAL LOW (ref >60)
GFR calc Af Amer: >60 mL/min (ref >60)
GFR calc non Af Amer: 51 mL/min — ABNORMAL LOW (ref >60)
GFR calc non Af Amer: 59 mL/min — ABNORMAL LOW (ref >60)
GLUCOSE: 424 mg/dL — AB (ref 65–99)
GLUCOSE: 259 mg/dL — AB (ref 65–99)
Potassium: 4 mmol/L (ref 3.5–5.1)
Potassium: 4.1 mmol/L (ref 3.5–5.1)
SODIUM: 130 mmol/L — AB (ref 135–145)
SODIUM: 131 mmol/L — AB (ref 135–145)

## 2017-03-02 LAB — URINALYSIS, ROUTINE W REFLEX MICROSCOPIC
BILIRUBIN URINE: NEGATIVE
Glucose, UA: >=500 mg/dL — AB
HGB URINE DIPSTICK: NEGATIVE
Ketones, ur: 20 mg/dL — AB
LEUKOCYTES UA: NEGATIVE
Nitrite: NEGATIVE
PH: 6 (ref 5.0–8.0)
Protein, ur: NEGATIVE mg/dL
SPECIFIC GRAVITY, URINE: 1.03 (ref 1.005–1.030)

## 2017-03-02 LAB — CBG MONITORING, ED
GLUCOSE-CAPILLARY: 560 mg/dL — AB (ref 65–99)
Glucose-Capillary: 427 mg/dL — ABNORMAL HIGH (ref 65–99)

## 2017-03-02 LAB — CBC WITH DIFFERENTIAL/PLATELET
BASOS PCT: 0 %
Basophils Absolute: 0 10*3/uL (ref 0.0–0.1)
EOS PCT: 1 %
Eosinophils Absolute: 0.2 10*3/uL (ref 0.0–0.7)
HEMATOCRIT: 46.3 % — AB (ref 36.0–46.0)
HEMOGLOBIN: 14.7 g/dL (ref 12.0–15.0)
LYMPHS PCT: 15 %
Lymphs Abs: 2.7 10*3/uL (ref 0.7–4.0)
MCH: 27.7 pg (ref 26.0–34.0)
MCHC: 31.7 g/dL (ref 30.0–36.0)
MCV: 87.4 fL (ref 78.0–100.0)
MONOS PCT: 5 %
Monocytes Absolute: 0.9 10*3/uL (ref 0.1–1.0)
NEUTROS ABS: 14.2 10*3/uL — AB (ref 1.7–7.7)
NEUTROS PCT: 79 %
Platelets: 386 10*3/uL (ref 150–400)
RBC: 5.3 MIL/uL — ABNORMAL HIGH (ref 3.87–5.11)
RDW: 19 % — ABNORMAL HIGH (ref 11.5–15.5)
WBC: 18 10*3/uL — ABNORMAL HIGH (ref 4.0–10.5)

## 2017-03-02 LAB — COMPREHENSIVE METABOLIC PANEL
ALBUMIN: 3.4 g/dL — AB (ref 3.5–5.0)
ALK PHOS: 78 U/L (ref 38–126)
ALT: 29 U/L (ref 14–54)
ANION GAP: 17 — AB (ref 5–15)
AST: 20 U/L (ref 15–41)
BUN: 26 mg/dL — ABNORMAL HIGH (ref 6–20)
CHLORIDE: 89 mmol/L — AB (ref 101–111)
CO2: 25 mmol/L (ref 22–32)
Calcium: 9.5 mg/dL (ref 8.9–10.3)
Creatinine, Ser: 1.45 mg/dL — ABNORMAL HIGH (ref 0.44–1.00)
GFR calc Af Amer: 47 mL/min — ABNORMAL LOW (ref >60)
GFR calc non Af Amer: 41 mL/min — ABNORMAL LOW (ref >60)
GLUCOSE: 657 mg/dL — AB (ref 65–99)
POTASSIUM: 4.2 mmol/L (ref 3.5–5.1)
SODIUM: 131 mmol/L — AB (ref 135–145)
Total Bilirubin: 1 mg/dL (ref 0.3–1.2)
Total Protein: 6.8 g/dL (ref 6.5–8.1)

## 2017-03-02 LAB — GLUCOSE, CAPILLARY
GLUCOSE-CAPILLARY: 216 mg/dL — AB (ref 65–99)
Glucose-Capillary: 349 mg/dL — ABNORMAL HIGH (ref 65–99)
Glucose-Capillary: 291 mg/dL — ABNORMAL HIGH (ref 65–99)
Glucose-Capillary: 250 mg/dL — ABNORMAL HIGH (ref 65–99)

## 2017-03-02 LAB — I-STAT TROPONIN, ED: Troponin i, poc: 0.01 ng/mL (ref 0.00–0.08)

## 2017-03-02 LAB — MRSA PCR SCREENING: MRSA by PCR: NEGATIVE

## 2017-03-02 MED ORDER — DOXEPIN HCL 10 MG PO CAPS
20.0000 mg | ORAL_CAPSULE | Freq: Every day | ORAL | Status: DC
  Administered 2017-03-02 – 2017-03-03 (×2): 20 mg via ORAL
  Filled 2017-03-02 (×2): qty 2

## 2017-03-02 MED ORDER — HYDROCODONE-ACETAMINOPHEN 5-325 MG PO TABS
1.0000 | ORAL_TABLET | Freq: Once | ORAL | Status: AC
  Administered 2017-03-02: 1 via ORAL
  Filled 2017-03-02: qty 1

## 2017-03-02 MED ORDER — SODIUM CHLORIDE 0.9 % IV SOLN
INTRAVENOUS | Status: DC
  Administered 2017-03-02: 5 [IU]/h via INTRAVENOUS
  Filled 2017-03-02: qty 1

## 2017-03-02 MED ORDER — ARIPIPRAZOLE 10 MG PO TABS
10.0000 mg | ORAL_TABLET | ORAL | Status: DC
  Administered 2017-03-03 – 2017-03-04 (×2): 10 mg via ORAL
  Filled 2017-03-02 (×2): qty 1

## 2017-03-02 MED ORDER — ENOXAPARIN SODIUM 40 MG/0.4ML ~~LOC~~ SOLN
40.0000 mg | SUBCUTANEOUS | Status: DC
  Administered 2017-03-02: 40 mg via SUBCUTANEOUS
  Filled 2017-03-02 (×2): qty 0.4

## 2017-03-02 MED ORDER — TIZANIDINE HCL 4 MG PO TABS
4.0000 mg | ORAL_TABLET | Freq: Two times a day (BID) | ORAL | Status: DC
  Administered 2017-03-02 – 2017-03-04 (×4): 4 mg via ORAL
  Filled 2017-03-02 (×4): qty 1

## 2017-03-02 MED ORDER — TEMAZEPAM 15 MG PO CAPS
15.0000 mg | ORAL_CAPSULE | Freq: Every evening | ORAL | Status: DC | PRN
  Administered 2017-03-02 – 2017-03-03 (×2): 15 mg via ORAL
  Filled 2017-03-02 (×2): qty 1

## 2017-03-02 MED ORDER — LACTATED RINGERS IV SOLN: INTRAVENOUS | Status: DC

## 2017-03-02 MED ORDER — IPRATROPIUM-ALBUTEROL 0.5-2.5 (3) MG/3ML IN SOLN
3.0000 mL | Freq: Three times a day (TID) | RESPIRATORY_TRACT | Status: DC
  Administered 2017-03-03: 3 mL via RESPIRATORY_TRACT
  Filled 2017-03-02: qty 3

## 2017-03-02 MED ORDER — SODIUM CHLORIDE 0.9 % IV SOLN: INTRAVENOUS | Status: DC

## 2017-03-02 MED ORDER — FLUTICASONE FUROATE-VILANTEROL 100-25 MCG/INH IN AEPB
1.0000 | INHALATION_SPRAY | Freq: Every day | RESPIRATORY_TRACT | Status: DC
  Administered 2017-03-03 – 2017-03-04 (×2): 1 via RESPIRATORY_TRACT
  Filled 2017-03-02: qty 28

## 2017-03-02 MED ORDER — SODIUM CHLORIDE 0.9 % IV SOLN
INTRAVENOUS | Status: DC
  Administered 2017-03-02: 21:00:00 via INTRAVENOUS

## 2017-03-02 MED ORDER — IPRATROPIUM-ALBUTEROL 0.5-2.5 (3) MG/3ML IN SOLN
3.0000 mL | Freq: Once | RESPIRATORY_TRACT | Status: AC
  Administered 2017-03-02: 3 mL via RESPIRATORY_TRACT
  Filled 2017-03-02: qty 3

## 2017-03-02 MED ORDER — ELVITEG-COBIC-EMTRICIT-TENOFAF 150-150-200-10 MG PO TABS
1.0000 | ORAL_TABLET | Freq: Every day | ORAL | Status: DC
  Administered 2017-03-02 – 2017-03-04 (×3): 1 via ORAL
  Filled 2017-03-02 (×3): qty 1

## 2017-03-02 MED ORDER — LACTATED RINGERS IV BOLUS (SEPSIS)
2000.0000 mL | Freq: Once | INTRAVENOUS | Status: AC
  Administered 2017-03-02: 2000 mL via INTRAVENOUS

## 2017-03-02 MED ORDER — POTASSIUM CHLORIDE 10 MEQ/100ML IV SOLN: 10.0000 meq | INTRAVENOUS | Status: DC

## 2017-03-02 MED ORDER — IPRATROPIUM-ALBUTEROL 0.5-2.5 (3) MG/3ML IN SOLN
3.0000 mL | Freq: Four times a day (QID) | RESPIRATORY_TRACT | Status: DC
  Administered 2017-03-02: 3 mL via RESPIRATORY_TRACT
  Filled 2017-03-02: qty 3

## 2017-03-02 MED ORDER — DEXTROSE-NACL 5-0.45 % IV SOLN: INTRAVENOUS | Status: DC

## 2017-03-02 MED ORDER — POTASSIUM CHLORIDE 10 MEQ/100ML IV SOLN
10.0000 meq | INTRAVENOUS | Status: AC
  Administered 2017-03-02 (×2): 10 meq via INTRAVENOUS
  Filled 2017-03-02 (×2): qty 100

## 2017-03-02 MED ORDER — TRAMADOL HCL 50 MG PO TABS
50.0000 mg | ORAL_TABLET | Freq: Once | ORAL | Status: AC
  Administered 2017-03-02: 50 mg via ORAL
  Filled 2017-03-02: qty 1

## 2017-03-02 MED ORDER — DULOXETINE HCL 60 MG PO CPEP: 60.0000 mg | ORAL_CAPSULE | Freq: Every day | ORAL | Status: DC

## 2017-03-02 NOTE — H&P (Signed)
History and Physical  [name]:[id number] DOB: 1965/08/28 DOA: 03/02/2017  Referring physician: Ward JP PCP: [name], PA-C    Chief Complaint:  [name], continued cough and wheezing  HPI: [name] is a 52 y.o. female with hx of CAKD, HIV on HAART, COPD (active smoker), IDDM who was recently hospitalized for COPD exacerbation treated with antibiotics and discharged with steroid taper (hospitalized at [facility] 2/23-2/25). CXR was clear and influenza screening was negative at that time. Since discharge, has continued to have intermittently productive cough and wheezing and in this setting has not been compliant with her medications, including insulin. Began having generalized malaise, nausea and NBNB emesis x 1 day. Poor appetite for past 2 days. No diarrhea or change in BM. Denies urinary frequency or other UTI sx. Denies fevers or chills. During my interview, patient reports that breathing has improved compared to a week ago, although she did just receive a nebulizer treatment prior to exam.   ED course VS T 98.6 HR 115 BP 143/86 RR 20 satting 96% on RA Blood sugar in 600s. Anion gap 17 UA showing +ketones  Review of Systems: All systems reviewed and apart from history of presenting illness, are negative.  Past Medical History:  Diagnosis Date  . Acute metabolic encephalopathy 6/50/3546  . Acute on chronic respiratory failure with hypoxia ([place]) 05/19/2016  . Anxiety   . ARF (acute renal failure) ([place]) 05/03/2014  . Arthritis    LEFT HIP  . Asthma    HOSPITALIZED WITH EXCERBATION OF ASTHMA - AND BRONCHITIS AND THE FLU DEC 2013  . Asthma exacerbation attacks 12/28/2011  . Bronchitis 12/28/2011  . CAP (community acquired pneumonia) 07/25/2012  . Chronic kidney disease (CKD), stage IV (severe) ([place]) 11/10/2014  . Class 3 obesity due to excess calories with serious comorbidity and body mass index (BMI) of 40.0 to 44.9 in adult   . Colles' fracture of left  radius   . Depression   . Diabetes mellitus    ON INSULIN AND ORAL MEDICATIONS  . Diabetes mellitus type 2 with complications, uncontrolled ([place]) 04/19/2008   Qualifier: Diagnosis of  By: Tomma Lightning MD, Claiborne Billings     . Diabetic foot infection ([place]) 11/09/2014  . Diabetic ketoacidosis without coma associated with type 2 diabetes mellitus ([place])   . Diabetic neuropathy ([place]) 11/10/2014  . Diarrhea 08/30/2008   Qualifier: Diagnosis of  By: Tomma Lightning MD, Claiborne Billings    . Diastolic dysfunction 56/08/1273  . Difficult intravenous access   . DKA, type 2 ([place]) 04/04/2016  . Dyslipidemia 12/23/2008   Qualifier: Diagnosis of  By: Tomma Lightning MD, Claiborne Billings    . Essential hypertension 04/19/2008   Qualifier: Diagnosis of  By: Tomma Lightning MD, Claiborne Billings    . FATIGUE 07/12/2008   Qualifier: Diagnosis of  By: Tomma Lightning MD, Claiborne Billings    . Femoral neck fracture ([place]) 07/16/2011  . Femur fracture, left ([place]) 07/08/2014  . Fever   . Fracture of distal femur ([place]) 07/08/2014  . GERD (gastroesophageal reflux disease)   . Hereditary and idiopathic peripheral neuropathy 04/19/2008   Qualifier: Diagnosis of  By: Tomma Lightning MD, Claiborne Billings    . Hip fracture, left ([place]) 07/16/2011  . Hip pain   . HIV infection ([place]) 2000  . Human immunodeficiency virus (HIV) disease ([place]) 04/19/2008   [id number] +   . Hyperlipidemia   . Hypertension   . Hyponatremia 12/29/2011  . Influenza A 12/29/2011  . Influenza B   . Insomnia 06/12/2016  . Left  hip pain 05/03/2014  . Mood disorder ([place]) 04/19/2008   Qualifier: Diagnosis of  By: Tomma Lightning MD, Claiborne Billings    . Neuropathy    NEUROPATHY HANDS AND FEET  . NSTEMI (non-ST elevated myocardial infarction) ([place]) 05/19/2016  . Obesity hypoventilation syndrome ([place])   . Osteoporosis 07/08/2014  . Pain    SEVERE PAIN LEFT HIP - HX OF LEFT HIP PINNING JULY 2013  . Perimenopausal symptoms 02/13/2012   LMP around 2011. On estrace and provera since around 2012 for hot flashes, mood swings. Estrace 2 mg daily, Provera 2.5 mg for 5 days each month.   .  Repeated falls 07/30/2011  . Sepsis ([place]) 04/04/2016  . Shortness of breath    ALLERGIES ARE "ACTING UP"  . SOB (shortness of breath)   . THRUSH 05/20/2008   Qualifier: Diagnosis of  By: Tomma Lightning MD, Claiborne Billings      . Tobacco use disorder 04/14/2010   Past Surgical History:  Procedure Laterality Date  . AMPUTATION Left 11/18/2014   Procedure: LEFT BELOW KNEE AMPUTATION;  Surgeon: Leandrew Koyanagi, MD;  Location: [facility] ;  Service: Orthopedics;  Laterality: Left;  . CAST APPLICATION Left 0/0/9233   Procedure: CAST APPLICATION (FIBERGLASS);  Surgeon: Latanya Maudlin, MD;  Location: [facility];  Service: Orthopedics;  Laterality: Left;  CLOSED REDUCTION OF LEFT COLLES FRACTURE WITH SHORT ARM CAST  . HARDWARE REMOVAL Left 05/05/2014   Procedure: HARDWARE REMOVAL LEFT HIP;  Surgeon: Latanya Maudlin, MD;  Location: [facility];  Service: Orthopedics;  Laterality: Left;  REMOVAL BIOMET 6.5-8.0 CANNULATED SCREW  . HIP ARTHROPLASTY Left 05/05/2014   Procedure: ARTHROPLASTY OPEN REDUCTION INTERNAL FIXATION LEFT HIP AND REMOVAL OF TWO CANNULATED SCREW;  Surgeon: Latanya Maudlin, MD;  Location: [facility];  Service: Orthopedics;  Laterality: Left;  . HIP PINNING,CANNULATED  07/16/2011   Procedure: CANNULATED HIP PINNING;  Surgeon: Gearlean Alf, MD;  Location[facility];  Service: Orthopedics;  Laterality: Left;  . HIP PINNING,CANNULATED Left 04/09/2012   Procedure: CANNULATED HIP PINNING AND HARDWARE REVISION;  Surgeon: Gearlean Alf, MD;  Location: [facility];  Service: Orthopedics;  Laterality: Left;  . I&D EXTREMITY Left 11/16/2014   Procedure:  DEBRIDEMENT OF LEFT FOOT POSSIBLE APPLICATION OF INTEGRIA AND VAC ;  Surgeon: Irene Limbo, MD;  Location: [facility];  Service: Plastics;  Laterality: Left;  . PERIPHERAL VASCULAR CATHETERIZATION N/A 11/15/2014   Procedure: Abdominal Aortogram;  Surgeon: Conrad Mesick, MD;  Location: [facility];  Service: Cardiovascular;  Laterality: N/A;  . PERIPHERAL VASCULAR CATHETERIZATION  11/15/2014    Procedure: Lower Extremity Angiography;  Surgeon: Conrad Hale, MD;  Location: [facility];  Service: Cardiovascular;;  . PERIPHERAL VASCULAR CATHETERIZATION Left 11/15/2014   Procedure: Peripheral Vascular Intervention;  Surgeon: Conrad [place], MD;  Location: [facility];  Service: Cardiovascular;  Laterality: Left;  popliteal artery stenting   Social History:  reports that she has been smoking e-cigarettes and cigarettes.  She has a 17.50 pack-year smoking history. she has never used smokeless tobacco. She reports that she does not drink alcohol or use drugs.   Allergies  Allergen Reactions  . Cortizone-10 [Hydrocortisone] Rash    Per patient, given local injection at knee and developed rash at local site.     Family History  Problem Relation Age of Onset  . Diabetes Mother   . Hypertension Mother   . Vision loss Mother   . Heart disease Mother   . Hypertension Father   .  Thyroid disease Neg Hx       Prior to Admission medications   Medication Sig Start Date End Date Taking? Authorizing Provider  ARIPiprazole (ABILIFY) 10 MG tablet Take 10 mg by mouth every morning. 02/13/17  Yes [provider]  clonazePAM (KLONOPIN) 1 MG tablet Take 1 mg by mouth 3 (three) times daily as needed for anxiety. 02/13/17  Yes [provider]  diphenoxylate-atropine (LOMOTIL) 2.5-0.025 MG tablet Take 1 tablet by mouth 4 (four) times daily as needed for diarrhea or loose stools. 11/20/16  Yes Campbell Riches, MD  ACCU-CHEK AVIVA PLUS test strip TEST BLOOD SUGAR THREE TIMES DAILY AS DIRECTED 11/28/16   Elayne Snare, MD  albuterol [facility] HFA) 108 (90 Base) MCG/ACT inhaler Inhale 2 puffs into the lungs every 6 (six) hours as needed for wheezing or shortness of breath. 11/28/16   Chesley Mires, MD  aspirin EC 81 MG EC tablet Take 1 tablet (81 mg total) by mouth daily. Patient not taking: Reported on 02/26/2017 05/25/16   Rai, Vernelle Emerald, MD  Blood Glucose Monitoring Suppl  (ACCU-CHEK AVIVA PLUS) w/Device KIT USE TO TEST BLOOD SUGAR DAILY 07/03/16   Elayne Snare, MD  doxepin (SINEQUAN) 10 MG capsule Take 20 mg by mouth at bedtime. 02/13/17   [provider]  doxycycline (VIBRAMYCIN) 100 MG capsule Take 1 capsule (100 mg total) by mouth 2 (two) times daily. 02/25/17   Caren Griffins, MD  DULoxetine (CYMBALTA) 60 MG capsule Take 60 mg by mouth daily. 08/16/16   [provider]  fenofibrate (TRICOR) 145 MG tablet Take 145 mg by mouth daily.    [provider]  fluticasone furoate-vilanterol (BREO ELLIPTA) 100-25 MCG/INH AEPB Inhale 1 puff into the lungs daily. 11/28/16   Chesley Mires, MD  furosemide (LASIX) 80 MG tablet Take 80 mg by mouth daily. 02/10/17   [provider]  GENVOYA 150-150-200-10 MG TABS tablet TAKE 1 TABLET BY MOUTH EVERY DAY 02/26/17   Campbell Riches, MD  HYDROcodone-acetaminophen (NORCO) 10-325 MG tablet Take 1 tablet by mouth every 8 (eight) hours as needed (pain).    [provider]  Insulin Glargine (TOUJEO SOLOSTAR) 300 UNIT/ML SOPN Inject 65 Units into the skin daily. 02/11/17   Elayne Snare, MD  insulin regular human CONCENTRATED (HUMULIN R U-500 KWIKPEN) 500 UNIT/ML kwikpen INJECT 60 UNITS BEFORE BREAKFAST, 50 UNITS BEFORE LUNCH, AND 35 UNITS BEFORE DINNER. 02/11/17   Elayne Snare, MD  liraglutide (VICTOZA) 18 MG/3ML SOPN INJECT 1.8 MG INTO THE SKIN DAILY 02/11/17   Elayne Snare, MD  MAGNESIUM-OXIDE 400 (241.3 Mg) MG tablet Take 400 mg by mouth daily. 02/15/17   [provider]  NARCAN 4 MG/0.1ML LIQD nasal spray kit Place 1 spray into the nose as needed. overdose 02/01/17   [provider]  pantoprazole (PROTONIX) 40 MG tablet Take 40 mg by mouth daily. 09/02/14   [provider]  potassium chloride (K-DUR) 10 MEQ tablet Take 1 tablet (10 mEq total) by mouth daily. 02/26/17   Recardo Evangelist, PA-C  potassium chloride SA (K-DUR,KLOR-CON) 20 MEQ tablet Take 1 tablet (20 mEq total) by  mouth daily. X 3 days, then check potassium levels if needed to continue Patient taking differently: Take 20 mEq by mouth daily.  05/25/16   Rai, Vernelle Emerald, MD  predniSONE (DELTASONE) 10 MG tablet Take 4 tablets (40 mg total) by mouth daily. 4 tablets daily x 4 days then 3 daily x 4 days then 2 daily x 4 days  then 1 daily x 4 days 02/25/17   Caren Griffins, MD  pregabalin (LYRICA) 225 MG capsule Take 225 mg by mouth 2 (two) times daily.    [provider]  rosuvastatin (CRESTOR) 20 MG tablet Take 20 mg by mouth daily. Reported on 05/02/2015 09/02/14   [provider]  SYMBICORT 80-4.5 MCG/ACT inhaler Inhale 2 puffs into the lungs 2 (two) times daily. 02/26/17   [provider]  temazepam (RESTORIL) 15 MG capsule Take 15 mg by mouth at bedtime as needed for sleep. 02/13/17   [provider]  tiZANidine (ZANAFLEX) 4 MG tablet Take 4 mg by mouth 2 (two) times daily.  03/06/16   [provider]   Physical Exam: Vitals:   03/02/17 1545 03/02/17 1630 03/02/17 1715 03/02/17 1800  BP: 96/81 125/84 104/75 128/71  Pulse:  98 97 96  Resp:  20 18 17   Temp:      TempSrc:      SpO2:  94% 98% 96%  Weight:      Height:       GEN obese woman, resting comfortably in bed  HEENT NCAT EOM intact, PERRL, no LAD; dry mucus membranes  JVP estimated 4 cm above RA; no HJR, no carotid bruits b/l CV  Tachycardic regular, normal s1 and s2; no m/r/g RESP  CTA b/l; diminished at bases ABD soft NT ND +normoactive BS  EXT  L BKA well healed; R foot warm; no edema; 2+ DP on R foot; radial pulses intact  NEURO no focal deficits    Labs on Admission:  Basic Metabolic Panel: Recent Labs  Lab 02/24/17 0100 02/24/17 0157 02/25/17 0214 02/26/17 1737 03/02/17 1540  NA  --  136 140 141 131*  K  --  3.9 4.1 2.8* 4.2  CL  --  100* 104 93* 89*  CO2  --  19* 25 34* 25  GLUCOSE  --  314* 303* 300* 657*  BUN  --  23* 23* 20 26*  CREATININE 1.58* 1.47* 1.08* 0.93 1.45*  CALCIUM   --  8.2* 8.8* 9.1 9.5   Liver Function Tests: Recent Labs  Lab 02/26/17 1737 03/02/17 1540  AST 16 20  ALT 31 29  ALKPHOS 85 78  BILITOT 0.9 1.0  PROT 6.9 6.8  ALBUMIN 3.5 3.4*   Recent Labs  Lab 02/26/17 1737  LIPASE 18   No results for input(s): AMMONIA in the last 168 hours. CBC: Recent Labs  Lab 02/24/17 0100 02/24/17 0157 02/25/17 0214 02/26/17 1737 03/02/17 1540  WBC 12.9* 13.9* 8.1 11.4* 18.0*  NEUTROABS  --   --   --  9.4* 14.2*  HGB 10.7* 10.6* 10.2* 12.3 14.7  HCT 35.4* 35.6* 32.4* 37.8 46.3*  MCV 92.9 93.0 89.3 87.5 87.4  PLT 264 258 241 316 386   Cardiac Enzymes: No results for input(s): CKTOTAL, CKMB, CKMBINDEX, TROPONINI in the last 168 hours.  BNP (last 3 results) No results for input(s): PROBNP in the last 8760 hours. CBG: Recent Labs  Lab 02/24/17 1715 02/24/17 2151 02/25/17 0833 02/25/17 1132 03/02/17 1816  GLUCAP 405* 190* 351* 374* 560*    Radiological Exams on Admission: Dg Chest 2 View  Result Date: 03/02/2017 CLINICAL DATA:  Shortness of breath. EXAM: CHEST  2 VIEW COMPARISON:  CT scan and radiographs of February 26, 2017. FINDINGS: The heart size and mediastinal contours are within normal limits. Both lungs are clear. No pneumothorax or pleural effusion is noted. Old left rib fractures are  noted. IMPRESSION: No active cardiopulmonary disease. Electronically Signed   By: Marijo Conception, M.D.   On: 03/02/2017 16:08    EKG: Independently reviewed. Sinus tachycardia  Assessment/Plan Principal Problem:   DKA (diabetic ketoacidoses) ([name]) Active Problems:   Mood disorder ([name])   COPD with acute bronchitis ([place])   AKI (acute kidney injury) ([place])  # IDDM with DKA in setting of recent resp illness, steroid taper, and med non compliance.  > initial BG 600s; AG 17. Started on insulin drip and fluids in ED. Home insulin regimen 60units toujeo glargine + high mealtime requirements  (~60 units regular insulin pre-meals)  - VBG pending  -  insulin drip and titration as per DKA protocol  - IVF with NS then switch do D5-1/2NS when sugars <250  - aggressive potassium repletion goal K > 4  - check BMP q4h until K and sugars stabilized  - after anion gap closes AG < 12 and eating can administer 60 units glargine long acting 1 hour prior to stopping insulin drip  - when eating can space out CBG to ACHS   # AKI likely in setting of dehydration and DKA > admission Cr 1.45 (baseline ~1.0s, although frequent fluctuations to 1.5)  - fluid resuscitation as above  # Improving COPD exacerbation. Already completed prednisone and doxycycline course > clear on initial exam (post nebs)  - continue nebs q4h  - hold off on abx or steroids at this time  - can resume home inhalers  # HIV on ART  > CD4 count on 02/24/2017 was 220   - resume home genova  # Hx of anxiety/depression and insomnia > mood stable  - resume home aripiprazole, duloxetine  - resume home doxepin  # Hx of muscle spasms in legs  - resume home tizanidine   DVT Prophylaxis: lovenox subQ Code Status: full code Family Communication: not discussed  Disposition Plan: inpatient monitoring for DKA   Time spent: > 45 minutes  Colbert Ewing, MD [facility] Pager [phone]  If 7PM-7AM, please contact night-coverage [email or web address] Password [facility] 03/02/2017, 7:02 PM

## 2017-03-02 NOTE — Progress Notes (Signed)
NURSING PROGRESS NOTE  [name] [id number] Admission Data: 03/02/2017 11:10 PM Attending Provider: Colbert Ewing, MD NTZ:GYFVCBS, Loma Sousa, PA-C Code Status: FULL  Allergies:  Cortizone-10 [hydrocortisone] Past Medical History:   has a past medical history of Acute metabolic encephalopathy (4/96/7591), Acute on chronic respiratory failure with hypoxia ([place]) (05/19/2016), Anxiety, ARF (acute renal failure) ([place]) (05/03/2014), Arthritis, Asthma, Asthma exacerbation attacks (12/28/2011), Bronchitis (12/28/2011), CAP (community acquired pneumonia) (07/25/2012), Chronic kidney disease (CKD), stage IV (severe) ([place]) (11/10/2014), Class 3 obesity due to excess calories with serious comorbidity and body mass index (BMI) of 40.0 to 44.9 in adult, Colles' fracture of left radius, Depression, Diabetes mellitus, Diabetes mellitus type 2 with complications, uncontrolled ([place]) (04/19/2008), Diabetic foot infection ([place]) (11/09/2014), Diabetic ketoacidosis without coma associated with type 2 diabetes mellitus ([place]), Diabetic neuropathy ([place]) (11/10/2014), Diarrhea (6/38/4665), Diastolic dysfunction (99/03/5699), Difficult intravenous access, DKA, type 2 ([place]) (04/04/2016), Dyslipidemia (12/23/2008), Essential hypertension (04/19/2008), FATIGUE (07/12/2008), Femoral neck fracture ([place]) (07/16/2011), Femur fracture, left ([place]) (07/08/2014), Fever, Fracture of distal femur ([place]) (07/08/2014), GERD (gastroesophageal reflux disease), Hereditary and idiopathic peripheral neuropathy (04/19/2008), Hip fracture, left ([place]) (07/16/2011), Hip pain, HIV infection ([place]) (2000), Human immunodeficiency virus (HIV) disease ([place]) (04/19/2008), Hyperlipidemia, Hypertension, Hyponatremia (12/29/2011), Influenza A (12/29/2011), Influenza B, Insomnia (06/12/2016), Left hip pain (05/03/2014), Mood disorder ([place]) (04/19/2008), Neuropathy, NSTEMI (non-ST elevated myocardial infarction) ([place]) (05/19/2016), Obesity hypoventilation syndrome ([place]), Osteoporosis  (07/08/2014), Pain, Perimenopausal symptoms (02/13/2012), Repeated falls (07/30/2011), Sepsis ([place]) (04/04/2016), Shortness of breath, SOB (shortness of breath), THRUSH (05/20/2008), and Tobacco use disorder (04/14/2010). Past Surgical History:   has a past surgical history that includes Hip pinning, cannulated (07/16/2011); Hip pinning, cannulated (Left, 04/09/2012); Hip Arthroplasty (Left, 05/05/2014); Cast application (Left, 07/07/9388); Hardware Removal (Left, 05/05/2014); Cardiac catheterization (N/A, 11/15/2014); Cardiac catheterization (11/15/2014); Cardiac catheterization (Left, 11/15/2014); I&D extremity (Left, 11/16/2014); and Amputation (Left, 11/18/2014). Social History:   reports that she has been smoking e-cigarettes and cigarettes.  She has a 17.50 pack-year smoking history. she has never used smokeless tobacco. She reports that she does not drink alcohol or use drugs.  [name] is a 52 y.o. female patient admitted from ED:   Last Documented Vital Signs: Blood pressure 135/77, pulse 98, temperature 98.6 F (37 C), temperature source Oral, resp. rate 18, height 5\' 10"  (1.778 m), weight 97.5 kg (215 lb), last menstrual period 11/16/2010, SpO2 96 %.  Cardiac Monitoring: Box # [id number] in place. Cardiac monitor yields:normal sinus rhythm.  IV Fluids:  IV in place, occlusive dsg intact without redness, IV cath antecubital right, condition patent and no redness and left, condition patent and no redness normal saline.   Skin: Appropriate for ethnicity and intact with bruising on abdomin and bilateral arms. Left BKA with prosthesis at bedside.    Patient orientated to room. Information packet given to patient. Admission inpatient armband information verified with patient to include name and date of birth and placed on patient arm. Side rails up x 2, fall assessment and education completed with patient. Patient able to verbalize understanding of risk associated with falls and verbalized understanding  to call for assistance before getting out of bed. Call light within reach. Patient able to voice and demonstrate understanding of unit orientation instructions.    Will continue to evaluate and treat per MD orders.  Clydell Hakim RN, BSN

## 2017-03-02 NOTE — Progress Notes (Signed)
Attempted to get report x2; disconnected both times.  Will await call from ED for report.

## 2017-03-02 NOTE — ED Provider Notes (Signed)
[facility] EMERGENCY DEPARTMENT Provider Note   CSN: [id number] Arrival date & time: 03/02/17  1504     History   Chief Complaint Chief Complaint  Patient presents with  . Shortness of Breath  . Hyperglycemia    HPI [name] is a 52 y.o. female.  The history is provided by the patient and medical records. No language interpreter was used.  Shortness of Breath  Associated symptoms include a fever (Subjective), cough and chest pain. Pertinent negatives include no abdominal pain and no leg swelling.  Hyperglycemia  Associated symptoms: chest pain, fever (Subjective), shortness of breath and weakness   Associated symptoms: no abdominal pain    [name] is a 52 y.o. female  with a PMH of COPD, DM, HIV, CAD who presents to the Emergency Department complaining of persistent shortness of breath, wheezing and weakness over the last 2 weeks.  Harsh dry cough and central chest pain, mostly with coughing.  She was seen on 2/23 where she required hospital admission for likely COPD exacerbation.  She was started on antibiotics and discharged on doxycycline and steroid taper.  She reports completing course of antibiotics, but has not had any noticeable relief since hospital discharge.  She has been compliant with steroids as well, with most recent dose last night.  She is also been taking her home inhalers without any relief.  She was again seen on 2/26 where workup was reassuring she was discharged home with instructions to follow-up with her primary care doctor.  She has not had her follow-up appointment yet.  Today she felt much more week.  She does have oxygen at home which she states that she uses as needed.  She does not use this today, but occasionally will place 3 L for comfort.  She also reports history of diabetes, but not taking insulin over the last several days.  She states that she has been noncompliant because she has not felt well.  She is unsure of the  last time she checked her blood sugar, but reports it typically runs in the 200s.  Denies any abdominal pain, nausea or vomiting.  She does report subjective fever, however temperature in ED normal.  Given DuoNeb by EMS in route and feels much better following administration.  Past Medical History:  Diagnosis Date  . Acute metabolic encephalopathy 07/02/6376  . Acute on chronic respiratory failure with hypoxia ([place]) 05/19/2016  . Anxiety   . ARF (acute renal failure) ([place]) 05/03/2014  . Arthritis    LEFT HIP  . Asthma    HOSPITALIZED WITH EXCERBATION OF ASTHMA - AND BRONCHITIS AND THE FLU DEC 2013  . Asthma exacerbation attacks 12/28/2011  . Bronchitis 12/28/2011  . CAP (community acquired pneumonia) 07/25/2012  . Chronic kidney disease (CKD), stage IV (severe) ([place]) 11/10/2014  . Class 3 obesity due to excess calories with serious comorbidity and body mass index (BMI) of 40.0 to 44.9 in adult   . Colles' fracture of left radius   . Depression   . Diabetes mellitus    ON INSULIN AND ORAL MEDICATIONS  . Diabetes mellitus type 2 with complications, uncontrolled ([place]) 04/19/2008   Qualifier: Diagnosis of  By: Tomma Lightning MD, Claiborne Billings     . Diabetic foot infection ([place]) 11/09/2014  . Diabetic ketoacidosis without coma associated with type 2 diabetes mellitus ([place])   . Diabetic neuropathy ([place]) 11/10/2014  . Diarrhea 08/30/2008   Qualifier: Diagnosis of  By: Tomma Lightning MD, Claiborne Billings    .  Diastolic dysfunction 40/09/8117  . Difficult intravenous access   . DKA, type 2 ([place]) 04/04/2016  . Dyslipidemia 12/23/2008   Qualifier: Diagnosis of  By: Tomma Lightning MD, Claiborne Billings    . Essential hypertension 04/19/2008   Qualifier: Diagnosis of  By: Tomma Lightning MD, Claiborne Billings    . FATIGUE 07/12/2008   Qualifier: Diagnosis of  By: Tomma Lightning MD, Claiborne Billings    . Femoral neck fracture ([place]) 07/16/2011  . Femur fracture, left ([place]) 07/08/2014  . Fever   . Fracture of distal femur ([place]) 07/08/2014  . GERD (gastroesophageal reflux disease)   . Hereditary and  idiopathic peripheral neuropathy 04/19/2008   Qualifier: Diagnosis of  By: Tomma Lightning MD, Claiborne Billings    . Hip fracture, left ([place]) 07/16/2011  . Hip pain   . HIV infection ([place]) 2000  . Human immunodeficiency virus (HIV) disease ([place]) 04/19/2008   [id number] +   . Hyperlipidemia   . Hypertension   . Hyponatremia 12/29/2011  . Influenza A 12/29/2011  . Influenza B   . Insomnia 06/12/2016  . Left hip pain 05/03/2014  . Mood disorder ([place]) 04/19/2008   Qualifier: Diagnosis of  By: Tomma Lightning MD, Claiborne Billings    . Neuropathy    NEUROPATHY HANDS AND FEET  . NSTEMI (non-ST elevated myocardial infarction) ([place]) 05/19/2016  . Obesity hypoventilation syndrome ([place])   . Osteoporosis 07/08/2014  . Pain    SEVERE PAIN LEFT HIP - HX OF LEFT HIP PINNING JULY 2013  . Perimenopausal symptoms 02/13/2012   LMP around 2011. On estrace and provera since around 2012 for hot flashes, mood swings. Estrace 2 mg daily, Provera 2.5 mg for 5 days each month.   . Repeated falls 07/30/2011  . Sepsis ([place]) 04/04/2016  . Shortness of breath    ALLERGIES ARE "ACTING UP"  . SOB (shortness of breath)   . THRUSH 05/20/2008   Qualifier: Diagnosis of  By: Tomma Lightning MD, Claiborne Billings      . Tobacco use disorder 04/14/2010    Patient Active Problem List   Diagnosis Date Noted  . COPD exacerbation ([place]) 02/23/2017  . Acute on chronic respiratory failure with hypoxia and hypercapnia ([place]) 08/19/2016  . Leukocytosis 08/19/2016  . CKD (chronic kidney disease), stage III ([place]) 08/19/2016  . Altered mental status 08/19/2016  . Asthma exacerbation 06/18/2016  . Insomnia 06/12/2016  . Difficult intravenous access   . Acute on chronic respiratory failure with hypoxia ([place]) 05/19/2016  . NSTEMI (non-ST elevated myocardial infarction) ([place]) 05/19/2016  . Acute metabolic encephalopathy 14/78/2956  . Hyperlipidemia   . Class 3 obesity due to excess calories with serious comorbidity and body mass index (BMI) of 40.0 to 44.9 in adult   . Obesity hypoventilation  syndrome ([place])   . Diastolic dysfunction 21/30/8657  . Diabetic neuropathy ([place]) 11/10/2014  . Fracture of distal femur ([place]) 07/08/2014  . Osteoporosis 07/08/2014  . Femur fracture, left ([place]) 07/08/2014  . Colles' fracture of left radius   . Left hip pain 05/03/2014  . Perimenopausal symptoms 02/13/2012  . Hyponatremia 12/29/2011  . Asthma exacerbation attacks 12/28/2011  . Bronchitis 12/28/2011  . Repeated falls 07/30/2011  . Hip fracture, left ([place]) 07/16/2011  . Femoral neck fracture ([place]) 07/16/2011  . Dyslipidemia 12/23/2008  . THRUSH 05/20/2008  . Human immunodeficiency virus (HIV) disease ([place]) 04/19/2008  . Diabetes mellitus type 2 with complications, uncontrolled ([place]) 04/19/2008  . Mood disorder ([place]) 04/19/2008  . Hereditary and idiopathic peripheral neuropathy 04/19/2008  . Essential hypertension 04/19/2008    Past Surgical History:  Procedure Laterality Date  . AMPUTATION Left 11/18/2014   Procedure: LEFT BELOW KNEE AMPUTATION;  Surgeon: Leandrew Koyanagi, MD;  Location: [facility];  Service: Orthopedics;  Laterality: Left;  . CAST APPLICATION Left 6/0/1093   Procedure: CAST APPLICATION (FIBERGLASS);  Surgeon: Latanya Maudlin, MD;  Location: [facility];  Service: Orthopedics;  Laterality: Left;  CLOSED REDUCTION OF LEFT COLLES FRACTURE WITH SHORT ARM CAST  . HARDWARE REMOVAL Left 05/05/2014   Procedure: HARDWARE REMOVAL LEFT HIP;  Surgeon: Latanya Maudlin, MD;  Location: [facility];  Service: Orthopedics;  Laterality: Left;  REMOVAL BIOMET 6.5-8.0 CANNULATED SCREW  . HIP ARTHROPLASTY Left 05/05/2014   Procedure: ARTHROPLASTY OPEN REDUCTION INTERNAL FIXATION LEFT HIP AND REMOVAL OF TWO CANNULATED SCREW;  Surgeon: Latanya Maudlin, MD;  [facility];  Service: Orthopedics;  Laterality: Left;  . HIP PINNING,CANNULATED  07/16/2011   Procedure: CANNULATED HIP PINNING;  Surgeon: Gearlean Alf, MD;  Location: [facility];  Service: Orthopedics;  Laterality: Left;  . HIP PINNING,CANNULATED Left  04/09/2012   Procedure: CANNULATED HIP PINNING AND HARDWARE REVISION;  Surgeon: Gearlean Alf, MD;  Location: [facility];  Service: Orthopedics;  Laterality: Left;  . I&D EXTREMITY Left 11/16/2014   Procedure:  DEBRIDEMENT OF LEFT FOOT POSSIBLE APPLICATION OF INTEGRIA AND VAC ;  Surgeon: Irene Limbo, MD;  Location: [facility];  Service: Plastics;  Laterality: Left;  . PERIPHERAL VASCULAR CATHETERIZATION N/A 11/15/2014   Procedure: Abdominal Aortogram;  Surgeon: Conrad [place], MD;  Location: [facility];  Service: Cardiovascular;  Laterality: N/A;  . PERIPHERAL VASCULAR CATHETERIZATION  11/15/2014   Procedure: Lower Extremity Angiography;  Surgeon: Conrad [place], MD;  Location: [facility];  Service: Cardiovascular;;  . PERIPHERAL VASCULAR CATHETERIZATION Left 11/15/2014   Procedure: Peripheral Vascular Intervention;  Surgeon: Conrad [place]ide, MD;  Location: [facility];  Service: Cardiovascular;  Laterality: Left;  popliteal artery stenting    OB History    Gravida Para Term Preterm AB Living   2 1 1  0 1 1   SAB TAB Ectopic Multiple Live Births   1   0 0         Home Medications    Prior to Admission medications   Medication Sig Start Date End Date Taking? Authorizing Provider  ACCU-CHEK AVIVA PLUS test strip TEST BLOOD SUGAR THREE TIMES DAILY AS DIRECTED 11/28/16   Elayne Snare, MD  albuterol (PROAIR HFA) 108 (90 Base) MCG/ACT inhaler Inhale 2 puffs into the lungs every 6 (six) hours as needed for wheezing or shortness of breath. 11/28/16   Chesley Mires, MD  ARIPiprazole (ABILIFY) 10 MG tablet Take 10 mg by mouth every morning. 02/13/17   [provider]  aspirin EC 81 MG EC tablet Take 1 tablet (81 mg total) by mouth daily. Patient not taking: Reported on 02/26/2017 05/25/16   Rai, Vernelle Emerald, MD  Blood Glucose Monitoring Suppl (ACCU-CHEK AVIVA PLUS) w/Device KIT USE TO TEST BLOOD SUGAR DAILY 07/03/16   Elayne Snare, MD  clonazePAM (KLONOPIN) 1 MG tablet Take 1 mg by  mouth 3 (three) times daily as needed for anxiety. 02/13/17   [provider]  diphenoxylate-atropine (LOMOTIL) 2.5-0.025 MG tablet Take 1 tablet by mouth 4 (four) times daily as needed for diarrhea or loose stools. 11/20/16   Campbell Riches, MD  doxepin (SINEQUAN) 10 MG capsule Take 20 mg by mouth at bedtime. 02/13/17   [provider]  doxycycline (VIBRAMYCIN) 100 MG capsule Take 1 capsule (  100 mg total) by mouth 2 (two) times daily. 02/25/17   Caren Griffins, MD  DULoxetine (CYMBALTA) 60 MG capsule Take 60 mg by mouth daily. 08/16/16   [provider]  fenofibrate (TRICOR) 145 MG tablet Take 145 mg by mouth daily.    [provider]  fluticasone furoate-vilanterol (BREO ELLIPTA) 100-25 MCG/INH AEPB Inhale 1 puff into the lungs daily. 11/28/16   Chesley Mires, MD  furosemide (LASIX) 80 MG tablet Take 80 mg by mouth daily. 02/10/17   [provider]  GENVOYA 150-150-200-10 MG TABS tablet TAKE 1 TABLET BY MOUTH EVERY DAY 02/26/17   Campbell Riches, MD  HYDROcodone-acetaminophen (NORCO) 10-325 MG tablet Take 1 tablet by mouth every 8 (eight) hours as needed (pain).    [provider]  Insulin Glargine (TOUJEO SOLOSTAR) 300 UNIT/ML SOPN Inject 65 Units into the skin daily. 02/11/17   Elayne Snare, MD  insulin regular human CONCENTRATED (HUMULIN R U-500 KWIKPEN) 500 UNIT/ML kwikpen INJECT 60 UNITS BEFORE BREAKFAST, 50 UNITS BEFORE LUNCH, AND 35 UNITS BEFORE DINNER. 02/11/17   Elayne Snare, MD  liraglutide (VICTOZA) 18 MG/3ML SOPN INJECT 1.8 MG INTO THE SKIN DAILY 02/11/17   Elayne Snare, MD  MAGNESIUM-OXIDE 400 (241.3 Mg) MG tablet Take 400 mg by mouth daily. 02/15/17   [provider]  NARCAN 4 MG/0.1ML LIQD nasal spray kit Place 1 spray into the nose as needed. overdose 02/01/17   [provider]  pantoprazole (PROTONIX) 40 MG tablet Take 40 mg by mouth daily. 09/02/14   [provider]  potassium chloride (K-DUR) 10 MEQ tablet  Take 1 tablet (10 mEq total) by mouth daily. 02/26/17   Recardo Evangelist, PA-C  potassium chloride SA (K-DUR,KLOR-CON) 20 MEQ tablet Take 1 tablet (20 mEq total) by mouth daily. X 3 days, then check potassium levels if needed to continue Patient taking differently: Take 20 mEq by mouth daily.  05/25/16   Rai, Vernelle Emerald, MD  predniSONE (DELTASONE) 10 MG tablet Take 4 tablets (40 mg total) by mouth daily. 4 tablets daily x 4 days then 3 daily x 4 days then 2 daily x 4 days then 1 daily x 4 days 02/25/17   Caren Griffins, MD  pregabalin (LYRICA) 225 MG capsule Take 225 mg by mouth 2 (two) times daily.    [provider]  rosuvastatin (CRESTOR) 20 MG tablet Take 20 mg by mouth daily. Reported on 05/02/2015 09/02/14   [provider]  SYMBICORT 80-4.5 MCG/ACT inhaler Inhale 2 puffs into the lungs 2 (two) times daily. 02/26/17   [provider]  temazepam (RESTORIL) 15 MG capsule Take 15 mg by mouth at bedtime as needed for sleep. 02/13/17   [provider]  tiZANidine (ZANAFLEX) 4 MG tablet Take 4 mg by mouth 2 (two) times daily.  03/06/16   [provider]    Family History Family History  Problem Relation Age of Onset  . Diabetes Mother   . Hypertension Mother   . Vision loss Mother   . Heart disease Mother   . Hypertension Father   . Thyroid disease Neg Hx     Social History Social History   Tobacco Use  . Smoking status: Current Every Day Smoker    Packs/day: 0.50    Years: 35.00    Pack years: 17.50    Types: E-cigarettes, Cigarettes  . Smokeless tobacco: Never Used  Substance Use Topics  . Alcohol use: No    Alcohol/week: 0.0 oz  Comment: no drink since 2008  . Drug use: No    Comment: hx of crack/cocaine, clean 8 years     Allergies   Cortizone-10 [hydrocortisone]   Review of Systems Review of Systems  Constitutional: Positive for fever (Subjective). Negative for chills.  Respiratory: Positive for cough and shortness of  breath.   Cardiovascular: Positive for chest pain. Negative for palpitations and leg swelling.  Gastrointestinal: Negative for abdominal pain.  Neurological: Positive for weakness.  All other systems reviewed and are negative.    Physical Exam Updated Vital Signs BP 128/71   Pulse 96   Temp 98.6 F (37 C) (Oral)   Resp 17   Ht 5' 10"  (1.778 m)   Wt 97.5 kg (215 lb)   LMP 11/16/2010   SpO2 96%   BMI 30.85 kg/m   Physical Exam  Constitutional: She is oriented to person, place, and time. She appears well-developed and well-nourished. No distress.  HENT:  Head: Normocephalic and atraumatic.  Cardiovascular: Normal heart sounds.  No murmur heard. Tachycardic but regular.  Pulmonary/Chest: Effort normal and breath sounds normal. No respiratory distress. She exhibits tenderness.  Harsh cough with deep breathing. Lungs clear.  Speaking in full sentences without difficulty.  Abdominal: Soft. She exhibits no distension. There is no tenderness.  Musculoskeletal: She exhibits no edema.  Neurological: She is alert and oriented to person, place, and time.  Skin: Skin is warm and dry.  Nursing note and vitals reviewed.    ED Treatments / Results  Labs (all labs ordered are listed, but only abnormal results are displayed) Labs Reviewed  COMPREHENSIVE METABOLIC PANEL - Abnormal; Notable for the following components:      Result Value   Sodium 131 (*)    Chloride 89 (*)    Glucose, Bld 657 (*)    BUN 26 (*)    Creatinine, Ser 1.45 (*)    Albumin 3.4 (*)    GFR calc non Af Amer 41 (*)    GFR calc Af Amer 47 (*)    Anion gap 17 (*)    All other components within normal limits  CBC WITH DIFFERENTIAL/PLATELET - Abnormal; Notable for the following components:   WBC 18.0 (*)    RBC 5.30 (*)    HCT 46.3 (*)    RDW 19.0 (*)    Neutro Abs 14.2 (*)    All other components within normal limits  CBG MONITORING, ED - Abnormal; Notable for the following components:   Glucose-Capillary  560 (*)    All other components within normal limits  URINALYSIS, ROUTINE W REFLEX MICROSCOPIC  I-STAT TROPONIN, ED    EKG  EKG Interpretation  Date/Time:  Saturday March 02 2017 15:06:21 EST Ventricular Rate:  118 PR Interval:    QRS Duration: 84 QT Interval:  311 QTC Calculation: 436 R Axis:   -7 Text Interpretation:  Sinus tachycardia Confirmed by Pattricia Boss [phone]) on 03/02/2017 6:49:17 PM       Radiology Dg Chest 2 View  Result Date: 03/02/2017 CLINICAL DATA:  Shortness of breath. EXAM: CHEST  2 VIEW COMPARISON:  CT scan and radiographs of February 26, 2017. FINDINGS: The heart size and mediastinal contours are within normal limits. Both lungs are clear. No pneumothorax or pleural effusion is noted. Old left rib fractures are noted. IMPRESSION: No active cardiopulmonary disease. Electronically Signed   By: Marijo Conception, M.D.   On: 03/02/2017 16:08    Procedures Procedures (including critical care time)  CRITICAL CARE  Performed by: Ozella Almond Ariston Grandison   Total critical care time: 35 minutes  Critical care time was exclusive of separately billable procedures and treating other patients.  Critical care was necessary to treat or prevent imminent or life-threatening deterioration.  Critical care was time spent personally by me on the following activities: development of treatment plan with patient and/or surrogate as well as nursing, discussions with consultants, evaluation of patient's response to treatment, examination of patient, obtaining history from patient or surrogate, ordering and performing treatments and interventions, ordering and review of laboratory studies, ordering and review of radiographic studies, pulse oximetry and re-evaluation of patient's condition.  Medications Ordered in ED Medications  lactated ringers bolus 2,000 mL (2,000 mLs Intravenous New Bag/Given 03/02/17 1729)  dextrose 5 %-0.45 % sodium chloride infusion ( Intravenous Hold 03/02/17 1828)    insulin regular (NOVOLIN R,HUMULIN R) 100 Units in sodium chloride 0.9 % 100 mL (1 Units/mL) infusion (5 Units/hr Intravenous New Bag/Given 03/02/17 1818)  lactated ringers infusion (not administered)  ipratropium-albuterol (DUONEB) 0.5-2.5 (3) MG/3ML nebulizer solution 3 mL (3 mLs Nebulization Given 03/02/17 1558)  HYDROcodone-acetaminophen (NORCO/VICODIN) 5-325 MG per tablet 1 tablet (1 tablet Oral Given 03/02/17 1827)     Initial Impression / Assessment and Plan / ED Course  I have reviewed the triage vital signs and the nursing notes.  Pertinent labs & imaging results that were available during my care of the patient were reviewed by me and considered in my medical decision making (see chart for details).    [name] is a 52 y.o. female who presents to ED for shortness of breath, cough, wheezing and weakness over the last two weeks.  Seen in emergency department on 2/23 where she was admitted for COPD exacerbation and started on antibiotics.  She was discharged on prednisone taper and doxycycline which she has been taking as directed.  She was then seen on 2/26 where reassuring workup including negative CT angio was obtained.  She has now completed her course of doxycycline, still has a few days of prednisone taper remaining.  On exam today, patient is afebrile and hemodynamically stable.  She is a little bit tachycardic, however did receive DuoNeb prior to emergency department arrival.  Given recent negative CT angios with no acute changes in her symptoms, doubt PE.  She also has been noncompliant with diabetes medication over the last several days.  Will obtain EKG, chest x-ray and lab work including troponin for further evaluation.  EKG sinus tachycardia.  Troponin negative.  CXR negative. Labs reviewed: Leukocytosis of 18.0.  Possibly infectious, however is on prednisone taper.  CMP with glucose of 657 with anion gap of 17.  Hyponatremia and hypochloremia noted as well.  Elevated BUN  and creatinine at 26/1.45.  Hospitalist on glucose stabilizer and hospitalist consulted who will admit.   Final Clinical Impressions(s) / ED Diagnoses   Final diagnoses:  Shortness of breath  Hyperglycemia    ED Discharge Orders    None       Lillyana Majette, Ozella Almond, PA-C 03/02/17 1854    Pattricia Boss, MD 03/02/17 [phone]

## 2017-03-02 NOTE — ED Triage Notes (Signed)
Pt arrives via EMS from home with complaints of SOB after recently being seen here for PNA. Pt reports not taking her insulin for 4 days due to not feeling well. EMS gave 5 Albuterol and atrovent.

## 2017-03-02 NOTE — ED Notes (Signed)
Got patient undress on the monitor patient is resting with call bell in reach did ekg shown  to Dr Willa Frater

## 2017-03-03 DIAGNOSIS — B20 Human immunodeficiency virus [HIV] disease: Secondary | ICD-10-CM

## 2017-03-03 DIAGNOSIS — I950 Idiopathic hypotension: Secondary | ICD-10-CM | POA: Diagnosis not present

## 2017-03-03 DIAGNOSIS — E0810 Diabetes mellitus due to underlying condition with ketoacidosis without coma: Secondary | ICD-10-CM | POA: Diagnosis not present

## 2017-03-03 DIAGNOSIS — E119 Type 2 diabetes mellitus without complications: Secondary | ICD-10-CM

## 2017-03-03 DIAGNOSIS — J449 Chronic obstructive pulmonary disease, unspecified: Secondary | ICD-10-CM | POA: Diagnosis not present

## 2017-03-03 DIAGNOSIS — J9601 Acute respiratory failure with hypoxia: Secondary | ICD-10-CM | POA: Diagnosis not present

## 2017-03-03 DIAGNOSIS — Z794 Long term (current) use of insulin: Secondary | ICD-10-CM | POA: Diagnosis not present

## 2017-03-03 DIAGNOSIS — E1110 Type 2 diabetes mellitus with ketoacidosis without coma: Secondary | ICD-10-CM | POA: Diagnosis not present

## 2017-03-03 DIAGNOSIS — N179 Acute kidney failure, unspecified: Secondary | ICD-10-CM | POA: Diagnosis not present

## 2017-03-03 DIAGNOSIS — F39 Unspecified mood [affective] disorder: Secondary | ICD-10-CM | POA: Diagnosis not present

## 2017-03-03 LAB — GLUCOSE, CAPILLARY
GLUCOSE-CAPILLARY: 130 mg/dL — AB (ref 65–99)
GLUCOSE-CAPILLARY: 145 mg/dL — AB (ref 65–99)
GLUCOSE-CAPILLARY: 190 mg/dL — AB (ref 65–99)
GLUCOSE-CAPILLARY: 323 mg/dL — AB (ref 65–99)
Glucose-Capillary: 167 mg/dL — ABNORMAL HIGH (ref 65–99)
Glucose-Capillary: 141 mg/dL — ABNORMAL HIGH (ref 65–99)
Glucose-Capillary: 140 mg/dL — ABNORMAL HIGH (ref 65–99)
Glucose-Capillary: 182 mg/dL — ABNORMAL HIGH (ref 65–99)
Glucose-Capillary: 142 mg/dL — ABNORMAL HIGH (ref 65–99)
Glucose-Capillary: 288 mg/dL — ABNORMAL HIGH (ref 65–99)
Glucose-Capillary: 343 mg/dL — ABNORMAL HIGH (ref 65–99)

## 2017-03-03 LAB — CBC WITH DIFFERENTIAL/PLATELET
BASOS ABS: 0 10*3/uL (ref 0.0–0.1)
Basophils Relative: 0 %
EOS ABS: 0.3 10*3/uL (ref 0.0–0.7)
Eosinophils Relative: 2 %
HCT: 35.7 % — ABNORMAL LOW (ref 36.0–46.0)
HEMOGLOBIN: 11.7 g/dL — AB (ref 12.0–15.0)
LYMPHS PCT: 26 %
Lymphs Abs: 3.8 10*3/uL (ref 0.7–4.0)
MCH: 28.1 pg (ref 26.0–34.0)
MCHC: 32.8 g/dL (ref 30.0–36.0)
MCV: 85.8 fL (ref 78.0–100.0)
Monocytes Absolute: 0.6 10*3/uL (ref 0.1–1.0)
Monocytes Relative: 4 %
NEUTROS PCT: 68 %
Neutro Abs: 9.9 10*3/uL — ABNORMAL HIGH (ref 1.7–7.7)
PLATELETS: 281 10*3/uL (ref 150–400)
RBC: 4.16 MIL/uL (ref 3.87–5.11)
RDW: 19.6 % — ABNORMAL HIGH (ref 11.5–15.5)
WBC: 14.5 10*3/uL — AB (ref 4.0–10.5)

## 2017-03-03 LAB — BASIC METABOLIC PANEL
Anion gap: 10 (ref 5–15)
Anion gap: 8 (ref 5–15)
BUN: 16 mg/dL (ref 6–20)
BUN: 14 mg/dL (ref 6–20)
CHLORIDE: 98 mmol/L — AB (ref 101–111)
CO2: 26 mmol/L (ref 22–32)
CO2: 25 mmol/L (ref 22–32)
Calcium: 8.6 mg/dL — ABNORMAL LOW (ref 8.9–10.3)
Calcium: 8.6 mg/dL — ABNORMAL LOW (ref 8.9–10.3)
Chloride: 100 mmol/L — ABNORMAL LOW (ref 101–111)
Creatinine, Ser: 0.91 mg/dL (ref 0.44–1.00)
Creatinine, Ser: 0.94 mg/dL (ref 0.44–1.00)
GFR calc Af Amer: >60 mL/min (ref >60)
GLUCOSE: 151 mg/dL — AB (ref 65–99)
Glucose, Bld: 185 mg/dL — ABNORMAL HIGH (ref 65–99)
POTASSIUM: 3.6 mmol/L (ref 3.5–5.1)
POTASSIUM: 4 mmol/L (ref 3.5–5.1)
Sodium: 134 mmol/L — ABNORMAL LOW (ref 135–145)
Sodium: 133 mmol/L — ABNORMAL LOW (ref 135–145)

## 2017-03-03 LAB — HEMOGLOBIN A1C
Hgb A1c MFr Bld: 8.6 % — ABNORMAL HIGH (ref 4.8–5.6)
Mean Plasma Glucose: 200.12 mg/dL

## 2017-03-03 MED ORDER — IPRATROPIUM-ALBUTEROL 0.5-2.5 (3) MG/3ML IN SOLN: 3.0000 mL | RESPIRATORY_TRACT | Status: DC | PRN

## 2017-03-03 MED ORDER — INSULIN ASPART 100 UNIT/ML ~~LOC~~ SOLN
0.0000 [IU] | SUBCUTANEOUS | Status: DC
  Administered 2017-03-03 (×2): 15 [IU] via SUBCUTANEOUS
  Administered 2017-03-03: 3 [IU] via SUBCUTANEOUS
  Administered 2017-03-03: 11 [IU] via SUBCUTANEOUS
  Administered 2017-03-04: 7 [IU] via SUBCUTANEOUS
  Administered 2017-03-04: 4 [IU] via SUBCUTANEOUS
  Administered 2017-03-04: 7 [IU] via SUBCUTANEOUS
  Administered 2017-03-04: 15 [IU] via SUBCUTANEOUS

## 2017-03-03 MED ORDER — SODIUM CHLORIDE 0.9% FLUSH: 3.0000 mL | INTRAVENOUS | Status: DC | PRN

## 2017-03-03 MED ORDER — SODIUM CHLORIDE 0.9 % IV BOLUS (SEPSIS)
1000.0000 mL | Freq: Once | INTRAVENOUS | Status: AC
  Administered 2017-03-03: 1000 mL via INTRAVENOUS

## 2017-03-03 MED ORDER — SODIUM CHLORIDE 0.9% FLUSH
3.0000 mL | Freq: Two times a day (BID) | INTRAVENOUS | Status: DC
  Administered 2017-03-03 – 2017-03-04 (×3): 3 mL via INTRAVENOUS

## 2017-03-03 MED ORDER — TRAMADOL HCL 50 MG PO TABS
50.0000 mg | ORAL_TABLET | Freq: Four times a day (QID) | ORAL | Status: DC | PRN
Start: 2017-03-03 — End: 2017-03-04
  Administered 2017-03-03 – 2017-03-04 (×2): 50 mg via ORAL
  Filled 2017-03-03 (×2): qty 1

## 2017-03-03 MED ORDER — SODIUM CHLORIDE 0.9 % IV BOLUS (SEPSIS)
500.0000 mL | Freq: Once | INTRAVENOUS | Status: AC
  Administered 2017-03-03: 500 mL via INTRAVENOUS

## 2017-03-03 MED ORDER — ALBUTEROL SULFATE (2.5 MG/3ML) 0.083% IN NEBU: 2.5000 mg | INHALATION_SOLUTION | RESPIRATORY_TRACT | Status: DC | PRN

## 2017-03-03 MED ORDER — LOPERAMIDE HCL 2 MG PO CAPS
4.0000 mg | ORAL_CAPSULE | Freq: Once | ORAL | Status: AC
  Administered 2017-03-03: 4 mg via ORAL
  Filled 2017-03-03: qty 2

## 2017-03-03 MED ORDER — PREGABALIN 75 MG PO CAPS
225.0000 mg | ORAL_CAPSULE | Freq: Two times a day (BID) | ORAL | Status: DC
  Administered 2017-03-03 – 2017-03-04 (×3): 225 mg via ORAL
  Filled 2017-03-03 (×3): qty 3

## 2017-03-03 MED ORDER — TRAMADOL HCL 50 MG PO TABS
50.0000 mg | ORAL_TABLET | Freq: Once | ORAL | Status: AC
  Administered 2017-03-03: 50 mg via ORAL
  Filled 2017-03-03: qty 1

## 2017-03-03 MED ORDER — ENOXAPARIN SODIUM 40 MG/0.4ML ~~LOC~~ SOLN
40.0000 mg | SUBCUTANEOUS | Status: DC
  Administered 2017-03-03 – 2017-03-04 (×2): 40 mg via SUBCUTANEOUS
  Filled 2017-03-03 (×2): qty 0.4

## 2017-03-03 MED ORDER — CLONAZEPAM 1 MG PO TABS
1.0000 mg | ORAL_TABLET | Freq: Three times a day (TID) | ORAL | Status: DC | PRN
  Administered 2017-03-03: 1 mg via ORAL
  Filled 2017-03-03: qty 1

## 2017-03-03 MED ORDER — SODIUM CHLORIDE 0.9 % IV SOLN
250.0000 mL | INTRAVENOUS | Status: DC
  Administered 2017-03-03: 250 mL via INTRAVENOUS

## 2017-03-03 MED ORDER — DEXTROSE-NACL 5-0.45 % IV SOLN
INTRAVENOUS | Status: DC
  Administered 2017-03-03: 01:00:00 via INTRAVENOUS

## 2017-03-03 MED ORDER — INSULIN GLARGINE 100 UNIT/ML ~~LOC~~ SOLN
60.0000 [IU] | SUBCUTANEOUS | Status: DC
  Administered 2017-03-04: 60 [IU] via SUBCUTANEOUS
  Filled 2017-03-03: qty 0.6

## 2017-03-03 MED ORDER — SODIUM CHLORIDE 0.9 % IV SOLN
250.0000 mL | INTRAVENOUS | Status: DC | PRN
Start: 2017-03-03 — End: 2017-03-03

## 2017-03-03 MED ORDER — IPRATROPIUM-ALBUTEROL 0.5-2.5 (3) MG/3ML IN SOLN: 3.0000 mL | Freq: Three times a day (TID) | RESPIRATORY_TRACT | Status: DC

## 2017-03-03 MED ORDER — INSULIN GLARGINE 100 UNIT/ML ~~LOC~~ SOLN
32.0000 [IU] | SUBCUTANEOUS | Status: DC
  Administered 2017-03-03: 32 [IU] via SUBCUTANEOUS
  Filled 2017-03-03: qty 0.32

## 2017-03-03 MED ORDER — KETOROLAC TROMETHAMINE 30 MG/ML IJ SOLN
30.0000 mg | Freq: Four times a day (QID) | INTRAMUSCULAR | Status: DC | PRN
  Administered 2017-03-03: 30 mg via INTRAVENOUS
  Filled 2017-03-03: qty 1

## 2017-03-03 MED ORDER — SODIUM CHLORIDE 0.9 % IV SOLN
250.0000 mL | INTRAVENOUS | Status: DC | PRN
  Administered 2017-03-03: 250 mL via INTRAVENOUS

## 2017-03-03 MED ORDER — GUAIFENESIN-DM 100-10 MG/5ML PO SYRP
5.0000 mL | ORAL_SOLUTION | ORAL | Status: DC | PRN
  Administered 2017-03-03: 5 mL via ORAL
  Filled 2017-03-03 (×2): qty 5

## 2017-03-03 NOTE — Care Management Obs Status (Signed)
[facility] NOTIFICATION   Patient Details  Name: DARTHA ROZZELL MRN: [id number] Date of Birth: Aug 27, 1965   Medicare Observation Status Notification Given:  Yes    Claudie Leach, RN 03/03/2017, 5:48 PM

## 2017-03-03 NOTE — Care Management Note (Signed)
Case Management Note  Patient Details  Name: [name] MRN: [id number] Date of Birth: 03-26-1965  Subjective/Objective:       Pt presents for DKA.  Pt from home alone and has had frequent admissions recently.  Pt tearful regarding home situation stating there is no one to help her, when she falls she cannot get up, and she is afraid she will die at home alone. Pt requests HH and personal care assistant.  Pt states she has Medicaid and is eligible for a PCA through "[facility]" agency.  She was in the process of setting this up recently when she was hospitalized in February.    Pt states she has a walker and a w/c.         Action/Plan: Discussed HH options with patient including RN, PT, OT, NA, and CSW to help get PCA set up.  Pt given list of [facility] agencies to consider.  CM will follow up with pt closer to d/c.    Expected Discharge Date:                  Expected Discharge Plan:  [place]  In-House Referral:  NA  Discharge planning Services  CM Consult  Post Acute Care Choice:  Home Health Choice offered to:  Patient  DME Arranged:    DME Agency:     HH Arranged:    [facility] Agency:     Status of Service:  In process, will continue to follow  If discussed at Long Length of Stay Meetings, dates discussed:    Additional Comments:  Claudie Leach, RN 03/03/2017, 6:08 PM

## 2017-03-03 NOTE — Progress Notes (Signed)
PROGRESS NOTE        PATIENT DETAILS Name: JASEY CORTEZ Age: 52 y.o. Sex: female Date of Birth: 04/23/1965 Admit Date: 03/02/2017 Admitting Physician Colbert Ewing, MD JAS:[id number], [place], [place]  Brief Narrative: Patient is a 52 y.o. female with prior history of COPD, DM-2 on insulin, HIV on antiretrovirals who was just discharged from this facility on 2/25 after being treated for COPD exacerbation presented with generalized malaise, vomiting, continued cough and wheezing, further evaluation revealed DKA-started on insulin infusion and admitted to the hospitalist service.  See below for further details  Subjective: No vomiting-continues to cough.  Claims she has a headache this am.   Assessment/Plan: Diabetic ketoacidosis: Resolved-initially on insulin infusion-this has been transitioned to subcutaneous insulin.  Insulin-dependent DM-2: Poor historian-claims she is on Lantus and Humulin RU 500 with meals-suspect insulin requirements are significantly high at home because of noncompliance-for now would continue with 60 units of Lantus-and SSI.  I would follow her CBG trend and make adjustments accordingly.  Check A1c, last A1c in August was 8.5  Acute kidney injury: Likely secondary to DKA-has resolved.  Leukocytosis: Likely reactive-afebrile-does not look toxic-improving without the use of any antimicrobial therapy-follow for now.  Hypotension: seems to be asymptomatic-renal function has improved-reviewed progress note from most recent hospitalization-apparently blood pressure runs in the low side.  No evidence of sepsis or any other etiology accounting for hypertension.  Would continue to monitor for now with supportive care and follow.  COPD: No evidence of exacerbation-lungs are clear and only a few scattered rhonchi continue bronchodilators.  Chronic hypoxemic respiratory failure: On home O2-continue usual regimen.  HIV: Last CD4 count 220 on  02/24/17-continue antiretrovirals.  Follows with Dr. Johnnye Sima in the outpatient setting  Depression/anxiety: Slightly anxious due to acute illness-but otherwise appears stable-continue with Abilify, doxepin, and Klonopin.  Peripheral vascular disease-status post left BKA  Deconditioning/debility: Chronic deconditioning/debility at baseline-could have worsened due to acute illness/liver and hospitalization-await PT evaluation.  DVT Prophylaxis: Prophylactic Lovenox   Code Status: Full code   Family Communication: None at bedside  Disposition Plan: Remain inpatient-probably in the next day or so-await PT evaluation as well.  Antimicrobial agents: Anti-infectives (From admission, onward)   Start     Dose/Rate Route Frequency Ordered Stop   03/02/17 2100  elvitegravir-cobicistat-emtricitabine-tenofovir (GENVOYA) 150-150-200-10 MG tablet 1 tablet     1 tablet Oral Daily 03/02/17 1910        Procedures: None  CONSULTS:  None  Time spent: 25 minutes-Greater than 50% of this time was spent in counseling, explanation of diagnosis, planning of further management, and coordination of care.  MEDICATIONS: Scheduled Meds: . ARIPiprazole  10 mg Oral BH-q7a  . doxepin  20 mg Oral QHS  . elvitegravir-cobicistat-emtricitabine-tenofovir  1 tablet Oral Daily  . fluticasone furoate-vilanterol  1 puff Inhalation Daily  . insulin aspart  0-20 Units Subcutaneous Q4H  . [START ON 03/04/2017] insulin glargine  60 Units Subcutaneous Q24H  . sodium chloride flush  3 mL Intravenous Q12H  . tiZANidine  4 mg Oral BID   Continuous Infusions: . sodium chloride     PRN Meds:.sodium chloride, guaiFENesin-dextromethorphan, ipratropium-albuterol, sodium chloride flush, temazepam   PHYSICAL EXAM: Vital signs: Vitals:   03/03/17 0355 03/03/17 0541 03/03/17 0720 03/03/17 0821  BP: (!) 85/60 90/78  (!) 88/60  Pulse:    80  Resp:  19 18  15   Temp: 98 F (36.7 C)   98.4 F (36.9 C)  TempSrc: Oral    Oral  SpO2:   96% 91%  Weight:      Height:       Filed Weights   03/02/17 1508  Weight: 97.5 kg (215 lb)   Body mass index is 30.85 kg/m.   General appearance :Awake, alert, not in any distress.  Eyes:, pupils equally reactive to light and accomodation,no scleral icterus.Pink conjunctiva HEENT: Atraumatic and Normocephalic Neck: supple, no JVD. No cervical lymphadenopathy.  Resp:Good air entry bilaterally, no added sounds  CVS: S1 S2 regular, no murmurs.  GI: Bowel sounds present, Non tender and not distended with no gaurding, rigidity or rebound.No organomegaly Extremities: Left BKA Neurology:  speech clear,Non focal, sensation is grossly intact. Psychiatric: Normal judgment and insight. Alert and oriented x 3. Normal mood. Musculoskeletal:No digital cyanosis Skin:No Rash, warm and dry Wounds:N/A  I have personally reviewed following labs and imaging studies  LABORATORY DATA: CBC: Recent Labs  Lab 02/25/17 0214 02/26/17 1737 03/02/17 1540 03/03/17 0331  WBC 8.1 11.4* 18.0* 14.5*  NEUTROABS  --  9.4* 14.2* 9.9*  HGB 10.2* 12.3 14.7 11.7*  HCT 32.4* 37.8 46.3* 35.7*  MCV 89.3 87.5 87.4 85.8  PLT 241 316 386 939    Basic Metabolic Panel: Recent Labs  Lab 03/02/17 1540 03/02/17 1900 03/02/17 2218 03/03/17 0331 03/03/17 0725  NA 131* 130* 131* 134* 133*  K 4.2 4.0 4.1 3.6 4.0  CL 89* 91* 92* 98* 100*  CO2 25 25 23 26 25   GLUCOSE 657* 424* 259* 185* 151*  BUN 26* 23* 20 16 14   CREATININE 1.45* 1.21* 1.07* 0.91 0.94  CALCIUM 9.5 9.1 9.0 8.6* 8.6*    GFR: Estimated Creatinine Clearance: 88.5 mL/min (by C-G formula based on SCr of 0.94 mg/dL).  Liver Function Tests: Recent Labs  Lab 02/26/17 1737 03/02/17 1540  AST 16 20  ALT 31 29  ALKPHOS 85 78  BILITOT 0.9 1.0  PROT 6.9 6.8  ALBUMIN 3.5 3.4*   Recent Labs  Lab 02/26/17 1737  LIPASE 18   No results for input(s): AMMONIA in the last 168 hours.  Coagulation Profile: No results for  input(s): INR, PROTIME in the last 168 hours.  Cardiac Enzymes: No results for input(s): CKTOTAL, CKMB, CKMBINDEX, TROPONINI in the last 168 hours.  BNP (last 3 results) No results for input(s): PROBNP in the last 8760 hours.  HbA1C: No results for input(s): HGBA1C in the last 72 hours.  CBG: Recent Labs  Lab 03/03/17 0351 03/03/17 0453 03/03/17 0553 03/03/17 0641 03/03/17 0754  GLUCAP 141* 140* 182* 190* 142*    Lipid Profile: No results for input(s): CHOL, HDL, LDLCALC, TRIG, CHOLHDL, LDLDIRECT in the last 72 hours.  Thyroid Function Tests: No results for input(s): TSH, T4TOTAL, FREET4, T3FREE, THYROIDAB in the last 72 hours.  Anemia Panel: No results for input(s): VITAMINB12, FOLATE, FERRITIN, TIBC, IRON, RETICCTPCT in the last 72 hours.  Urine analysis:    Component Value Date/Time   COLORURINE YELLOW 03/02/2017 [place] 03/02/2017 1634   LABSPEC 1.030 03/02/2017 1634   PHURINE 6.0 03/02/2017 1634   GLUCOSEU >=500 (A) 03/02/2017 1634   GLUCOSEU 250 (A) 03/16/2015 1612   HGBUR NEGATIVE 03/02/2017 1634   BILIRUBINUR NEGATIVE 03/02/2017 1634   KETONESUR 20 (A) 03/02/2017 1634   PROTEINUR NEGATIVE 03/02/2017 1634   UROBILINOGEN 0.2 03/16/2015 1612   NITRITE NEGATIVE 03/02/2017 1634  LEUKOCYTESUR NEGATIVE 03/02/2017 1634    Sepsis Labs: Lactic Acid, Venous    Component Value Date/Time   LATICACIDVEN 1.26 02/23/2017 1759    MICROBIOLOGY: Recent Results (from the past 240 hour(s))  Blood culture (routine x 2)     Status: Abnormal   Collection Time: 02/23/17  5:45 PM  Result Value Ref Range Status   Specimen Description BLOOD RIGHT ANTECUBITAL  Final   Special Requests   Final    BOTTLES DRAWN AEROBIC AND ANAEROBIC Blood Culture adequate volume   Culture  Setup Time   Final    GRAM POSITIVE COCCI IN CLUSTERS IN CHAINS IN BOTH AEROBIC AND ANAEROBIC BOTTLES CRITICAL RESULT CALLED TO, READ BACK BY AND VERIFIED WITH: B MANCHERIL,PHARMD AT  1904 02/24/17 BY L BENFIELD    Culture (A)  Final    STAPHYLOCOCCUS SPECIES (COAGULASE NEGATIVE) GRANULICATELLA ADIACENS THE SIGNIFICANCE OF ISOLATING THIS ORGANISM FROM A SINGLE SET OF BLOOD CULTURES WHEN MULTIPLE SETS ARE DRAWN IS UNCERTAIN. PLEASE NOTIFY THE MICROBIOLOGY DEPARTMENT WITHIN ONE WEEK IF SPECIATION AND SENSITIVITIES ARE REQUIRED. Performed at [facility], [place] [address] [place], [place] [address]    Report Status 02/26/2017 FINAL  Final  Blood Culture ID Panel (Reflexed)     Status: Abnormal   Collection Time: 02/23/17  5:45 PM  Result Value Ref Range Status   Enterococcus species NOT DETECTED NOT DETECTED Final   Listeria monocytogenes NOT DETECTED NOT DETECTED Final   Staphylococcus species DETECTED (A) NOT DETECTED Final    Comment: Methicillin (oxacillin) resistant coagulase negative staphylococcus. Possible blood culture contaminant (unless isolated from more than one blood culture draw or clinical case suggests pathogenicity). No antibiotic treatment is indicated for blood  culture contaminants. CRITICAL RESULT CALLED TO, READ BACK BY AND VERIFIED WITH: B MANCHERIL,PHARMD AT 1904 02/24/17 BY L BENFIELD    Staphylococcus aureus NOT DETECTED NOT DETECTED Final   Methicillin resistance DETECTED (A) NOT DETECTED Final    Comment: CRITICAL RESULT CALLED TO, READ BACK BY AND VERIFIED WITH: B MANCHERIL,PHARMD AT 1904 02/24/17 BY L BENFIELD    Streptococcus species NOT DETECTED NOT DETECTED Final   Streptococcus agalactiae NOT DETECTED NOT DETECTED Final   Streptococcus pneumoniae NOT DETECTED NOT DETECTED Final   Streptococcus pyogenes NOT DETECTED NOT DETECTED Final   Acinetobacter baumannii NOT DETECTED NOT DETECTED Final   Enterobacteriaceae species NOT DETECTED NOT DETECTED Final   Enterobacter cloacae complex NOT DETECTED NOT DETECTED Final   Escherichia coli NOT DETECTED NOT DETECTED Final   Klebsiella oxytoca NOT DETECTED NOT DETECTED Final   Klebsiella  pneumoniae NOT DETECTED NOT DETECTED Final   Proteus species NOT DETECTED NOT DETECTED Final   Serratia marcescens NOT DETECTED NOT DETECTED Final   Haemophilus influenzae NOT DETECTED NOT DETECTED Final   Neisseria meningitidis NOT DETECTED NOT DETECTED Final   Pseudomonas aeruginosa NOT DETECTED NOT DETECTED Final   Candida albicans NOT DETECTED NOT DETECTED Final   Candida glabrata NOT DETECTED NOT DETECTED Final   Candida krusei NOT DETECTED NOT DETECTED Final   Candida parapsilosis NOT DETECTED NOT DETECTED Final   Candida tropicalis NOT DETECTED NOT DETECTED Final    Comment: Performed at [facility], [place] [address], [place],  [address]  MRSA PCR Screening     Status: None   Collection Time: 02/24/17 12:32 AM  Result Value Ref Range Status   MRSA by PCR NEGATIVE NEGATIVE Final    Comment:        The GeneXpert MRSA  Assay (FDA approved for NASAL specimens only), is one component of a comprehensive MRSA colonization surveillance program. It is not intended to diagnose MRSA infection nor to guide or monitor treatment for MRSA infections. Performed at [facility], [place] [address], [place], [place] [address]   Blood culture (routine x 2)     Status: None   Collection Time: 02/24/17  1:00 AM  Result Value Ref Range Status   Specimen Description BLOOD RIGHT HAND  Final   Special Requests   Final    BOTTLES DRAWN AEROBIC AND ANAEROBIC Blood Culture results may not be optimal due to an inadequate volume of blood received in culture bottles   Culture   Final    NO GROWTH 5 DAYS Performed at [facility], [place] [address] [place], [place] [address]    Report Status 03/01/2017 FINAL  Final  MRSA PCR Screening     Status: None   Collection Time: 03/02/17  8:39 PM  Result Value Ref Range Status   MRSA by PCR NEGATIVE NEGATIVE Final    Comment:        The GeneXpert MRSA Assay (FDA approved for NASAL specimens only), is one component of a comprehensive  MRSA colonization surveillance program. It is not intended to diagnose MRSA infection nor to guide or monitor treatment for MRSA infections. Performed at [facility], [place] [address], [place], [place] [address]     RADIOLOGY STUDIES/RESULTS: Dg Chest 2 View  Result Date: 03/02/2017 CLINICAL DATA:  Shortness of breath. EXAM: CHEST  2 VIEW COMPARISON:  CT scan and radiographs of February 26, 2017. FINDINGS: The heart size and mediastinal contours are within normal limits. Both lungs are clear. No pneumothorax or pleural effusion is noted. Old left rib fractures are noted. IMPRESSION: No active cardiopulmonary disease. Electronically Signed   By: Marijo Conception, M.D.   On: 03/02/2017 16:08   Dg Chest 2 View  Result Date: 02/26/2017 CLINICAL DATA:  Shortness of breath. The patient was diagnosed with the flu and pneumonia 02/23/2017. EXAM: CHEST  2 VIEW COMPARISON:  Single-view of the chest 02/23/2017, 09/03/2016 and 05/20/2016. CT chest 05/23/2016. FINDINGS: There is cardiomegaly without edema. Lungs are clear. No pneumothorax or pleural effusion. No acute bony abnormality. IMPRESSION: Cardiomegaly without acute disease. Electronically Signed   By: Inge Rise M.D.   On: 02/26/2017 17:14   Dg Lumbar Spine Complete  Result Date: 02/26/2017 CLINICAL DATA:  Worsening of chronic back pain EXAM: LUMBAR SPINE - COMPLETE 4+ VIEW COMPARISON:  CT 05/03/2014 radiograph 08/11/2013 FINDINGS: Lumbar alignment within normal limits. Moderate narrowing, vacuum disc at L5-S1. Mild degenerative changes at L1-L2. Vertebral body heights are normal. Aortic atherosclerosis. Mild scoliosis. IMPRESSION: No acute osseous abnormality. Multilevel degenerative changes most notable at L5-S1. Electronically Signed   By: Donavan Foil M.D.   On: 02/26/2017 18:31   Ct Angio Chest Pe W/cm &/or Wo Cm  Result Date: 02/26/2017 CLINICAL DATA:  Shortness of Breath EXAM: CT ANGIOGRAPHY CHEST WITH CONTRAST TECHNIQUE:  Multidetector CT imaging of the chest was performed using the standard protocol during bolus administration of intravenous contrast. Multiplanar CT image reconstructions and MIPs were obtained to evaluate the vascular anatomy. CONTRAST:  16mL ISOVUE-370 IOPAMIDOL (ISOVUE-370) INJECTION 76% COMPARISON:  02/26/2017 FINDINGS: Cardiovascular: No filling defects in the pulmonary arteries to suggest pulmonary emboli. Heart is mildly enlarged. Aorta is normal caliber. Coronary artery calcifications scattered throughout the major coronary vessels. Mediastinum/Nodes: No mediastinal, hilar, or axillary adenopathy. Trachea  and esophagus are unremarkable. Lungs/Pleura: Scattered ground-glass opacities in the mid and upper lungs, likely small areas of inflammation/alveolitis. Mild emphysema. No confluent airspace opacities. No effusions. Upper Abdomen: Imaging into the upper abdomen shows no acute findings. Musculoskeletal: No acute bony abnormality. Review of the MIP images confirms the above findings. IMPRESSION: No evidence of pulmonary embolus. Scattered coronary artery calcifications.  Mild cardiomegaly. Minimal scattered ground-glass areas in the lungs, likely areas of small airways disease/alveolitis. Emphysema (ICD10-J43.9). Electronically Signed   By: Rolm Baptise M.D.   On: 02/26/2017 20:11   Dg Chest Port 1 View  Result Date: 02/23/2017 CLINICAL DATA:  Shortness of breath EXAM: PORTABLE CHEST 1 VIEW COMPARISON:  Chest radiograph 09/03/2016 FINDINGS: Unchanged mild cardiomegaly. Multiple old right-sided rib fractures. No focal consolidation or pulmonary edema. No pleural effusion. IMPRESSION: Unchanged cardiomegaly without focal airspace disease. Electronically Signed   By: Ulyses Jarred M.D.   On: 02/23/2017 18:08     LOS: 0 days   Oren Binet, MD  [facility] Pager:[phone]  If 7PM-7AM, please contact night-coverage [email or web address] Password [email or web address] 03/03/2017, 10:08 AM

## 2017-03-04 ENCOUNTER — Ambulatory Visit: Payer: Medicare HMO | Admitting: Endocrinology

## 2017-03-04 DIAGNOSIS — J209 Acute bronchitis, unspecified: Secondary | ICD-10-CM

## 2017-03-04 DIAGNOSIS — E1365 Other specified diabetes mellitus with hyperglycemia: Secondary | ICD-10-CM | POA: Diagnosis not present

## 2017-03-04 DIAGNOSIS — J80 Acute respiratory distress syndrome: Secondary | ICD-10-CM | POA: Diagnosis not present

## 2017-03-04 DIAGNOSIS — N179 Acute kidney failure, unspecified: Secondary | ICD-10-CM | POA: Diagnosis not present

## 2017-03-04 DIAGNOSIS — J440 Chronic obstructive pulmonary disease with acute lower respiratory infection: Secondary | ICD-10-CM

## 2017-03-04 DIAGNOSIS — E0910 Drug or chemical induced diabetes mellitus with ketoacidosis without coma: Secondary | ICD-10-CM

## 2017-03-04 DIAGNOSIS — E1110 Type 2 diabetes mellitus with ketoacidosis without coma: Secondary | ICD-10-CM | POA: Diagnosis not present

## 2017-03-04 LAB — CBC
HEMATOCRIT: 38.3 % (ref 36.0–46.0)
Hemoglobin: 12.4 g/dL (ref 12.0–15.0)
MCH: 28.5 pg (ref 26.0–34.0)
MCHC: 32.4 g/dL (ref 30.0–36.0)
MCV: 88 fL (ref 78.0–100.0)
Platelets: 214 10*3/uL (ref 150–400)
RBC: 4.35 MIL/uL (ref 3.87–5.11)
RDW: 20.4 % — AB (ref 11.5–15.5)
WBC: 11.9 10*3/uL — ABNORMAL HIGH (ref 4.0–10.5)

## 2017-03-04 LAB — BASIC METABOLIC PANEL
Anion gap: 9 (ref 5–15)
BUN: 16 mg/dL (ref 6–20)
CO2: 24 mmol/L (ref 22–32)
Calcium: 9.1 mg/dL (ref 8.9–10.3)
Chloride: 103 mmol/L (ref 101–111)
Creatinine, Ser: 1.13 mg/dL — ABNORMAL HIGH (ref 0.44–1.00)
GFR calc Af Amer: >60 mL/min (ref >60)
GFR, EST NON AFRICAN AMERICAN: 55 mL/min — AB (ref >60)
GLUCOSE: 197 mg/dL — AB (ref 65–99)
POTASSIUM: 4 mmol/L (ref 3.5–5.1)
Sodium: 136 mmol/L (ref 135–145)

## 2017-03-04 LAB — GLUCOSE, CAPILLARY
GLUCOSE-CAPILLARY: 191 mg/dL — AB (ref 65–99)
GLUCOSE-CAPILLARY: 313 mg/dL — AB (ref 65–99)
Glucose-Capillary: 175 mg/dL — ABNORMAL HIGH (ref 65–99)
Glucose-Capillary: 216 mg/dL — ABNORMAL HIGH (ref 65–99)
Glucose-Capillary: 232 mg/dL — ABNORMAL HIGH (ref 65–99)

## 2017-03-04 NOTE — Care Management Note (Signed)
Case Management Note  Patient Details  Name: [name] MRN: [id number] Date of Birth: [birth year]  Subjective/Objective:    Presents with DKA, hx of CAKD, HIV on HAART, COPD (active smoker), IDDM. From home alone.     PCP: [name]  Action/Plan: Transition to home today.  PTAR arranged for pt pick up for transportation to home.   Expected Discharge Date:  03/04/17               Expected Discharge Plan:  [place]  In-House Referral:  NA  Discharge planning Services  CM Consult  Post Acute Care Choice:  Home Health Choice offered to:  Patient  DME Arranged:    DME Agency:     HH Arranged:  RN, PT, Nurse's Aide, Social Work [employer] Agency:   [facility], pending MD's orders, NCM has requested orders from MD.   Status of Service:  Completed, signed off  If discussed at [employer], dates discussed:    Additional Comments:  Sharin Mons, RN 03/04/2017, 1:42 PM

## 2017-03-04 NOTE — Evaluation (Signed)
Physical Therapy Evaluation Patient Details Name: [name] MRN: [id number] DOB: Apr 19, 1965 Today's Date: 03/04/2017   History of Present Illness  [name] is a 52 y.o. female with hx of CAKD, HIV on HAART, COPD (active smoker), IDDM who was recently hospitalized for COPD exacerbation treated with antibiotics and discharged with steroid taper (hospitalized at [facility] 2/23-2/25). CXR was clear and influenza screening was negative at that time. Since discharge, has continued to have intermittently productive cough and wheezing and in this setting has not been compliant with her medications, including insulin. Began having generalized malaise, nausea and NBNB emesis x 1 day.  Clinical Impression  Pt admitted with above diagnosis. Pt currently with functional limitations due to the deficits listed below (see PT Problem List). Pt's activity tolerance very poor at this point with fatigue at only 10' of ambulation and decreased safety with last 5' and with turning due to fatigue. Pt also reports that she does now know how she will get food when she gets home. Would really be safest in a ALF environment.  Pt will benefit from skilled PT to increase their independence and safety with mobility to allow discharge to the venue listed below.       Follow Up Recommendations Home health PT and personal care attendant    Equipment Recommendations  None recommended by PT    Recommendations for Other Services OT consult     Precautions / Restrictions Precautions Precautions: Fall Precaution Comments: h/o falls with inability to get up, very fearful of falling Restrictions Weight Bearing Restrictions: No      Mobility  Bed Mobility Overal bed mobility: Modified Independent                Transfers Overall transfer level: Needs assistance Equipment used: Rolling walker (2 wheeled) Transfers: Sit to/from Stand Sit to Stand: Supervision         General transfer comment:  pt stood safely to RW, fatigue noted with all movements  Ambulation/Gait Ambulation/Gait assistance: Min assist Ambulation Distance (Feet): 15 Feet Assistive device: Rolling walker (2 wheeled) Gait Pattern/deviations: Step-through pattern;Trunk flexed;Wide base of support Gait velocity: decreased Gait velocity interpretation: <1.8 ft/sec, indicative of risk for recurrent falls General Gait Details: pt very fatigyed after 10', has difficulty turning and needs tactile cues to bring RW back with her before sitting down.   Stairs            Wheelchair Mobility    Modified Rankin (Stroke Patients Only)       Balance Overall balance assessment: Needs assistance Sitting-balance support: No upper extremity supported Sitting balance-Leahy Scale: Good     Standing balance support: Bilateral upper extremity supported Standing balance-Leahy Scale: Poor Standing balance comment: reliant on UE support                             Pertinent Vitals/Pain Pain Assessment: No/denies pain    Home Living Family/patient expects to be discharged to:: Private residence Living Arrangements: Alone Available Help at Discharge: Other (Comment)(pt reports she has no support) Type of Home: House Home Access: Stairs to enter;Ramped entrance Entrance Stairs-Rails: Right;Left Entrance Stairs-Number of Steps: 3 Home Layout: One level Home Equipment: Walker - 2 wheels;Cane - single point;Wheelchair - [employer];Shower seat      Prior Function Level of Independence: Independent with assistive device(s)         Comments: Pt has L BKA, dons independently. Uses RW in  house, w/c for longer distances. Pt does not drive. She reports that she used to use computure to order food to be delivered but that now her hands shake so badly that she cannot use her computer. She reports she does not know how she will get food     Hand Dominance   Dominant Hand: Right     Extremity/Trunk Assessment   Upper Extremity Assessment Upper Extremity Assessment: Generalized weakness    Lower Extremity Assessment Lower Extremity Assessment: Generalized weakness    Cervical / Trunk Assessment Cervical / Trunk Assessment: Kyphotic  Communication   Communication: No difficulties  Cognition Arousal/Alertness: Awake/alert Behavior During Therapy: Flat affect Overall Cognitive Status: Within Functional Limits for tasks assessed                                        General Comments General comments (skin integrity, edema, etc.): discussed possibilty of applying for Meals on Wheels and spoke to [place] with SW to get info to her     Exercises     Assessment/Plan    PT Assessment Patient needs continued PT services  PT Problem List Decreased strength;Decreased activity tolerance;Decreased balance;Decreased mobility;Decreased knowledge of precautions;Obesity       PT Treatment Interventions DME instruction;Gait training;Functional mobility training;Therapeutic activities;Therapeutic exercise;Balance training;Patient/family education    PT Goals (Current goals can be found in the Care Plan section)  Acute Rehab PT Goals Patient Stated Goal: get some help in her home PT Goal Formulation: With patient Time For Goal Achievement: 03/18/17 Potential to Achieve Goals: Fair    Frequency Min 3X/week   Barriers to discharge Decreased caregiver support seems to have no support    Co-evaluation               AM-PAC PT "6 Clicks" Daily Activity  Outcome Measure Difficulty turning over in bed (including adjusting bedclothes, sheets and blankets)?: None Difficulty moving from lying on back to sitting on the side of the bed? : None Difficulty sitting down on and standing up from a chair with arms (e.g., wheelchair, bedside commode, etc,.)?: A Little Help needed moving to and from a bed to chair (including a wheelchair)?: A Little Help  needed walking in hospital room?: A Little Help needed climbing 3-5 steps with a railing? : Total 6 Click Score: 18    End of Session Equipment Utilized During Treatment: Gait belt Activity Tolerance: Patient limited by fatigue Patient left: in bed;with call bell/phone within reach;with nursing/sitter in room Nurse Communication: Mobility status PT Visit Diagnosis: Unsteadiness on feet (R26.81);Muscle weakness (generalized) (M62.81);History of falling (Z91.81);Difficulty in walking, not elsewhere classified (R26.2)    Time: 8916-9450 PT Time Calculation (min) (ACUTE ONLY): 14 min   Charges:   PT Evaluation $PT Eval Moderate Complexity: 1 Mod     PT G Codes:        [place]  [place] 03/04/2017, 1:14 PM

## 2017-03-04 NOTE — Discharge Instructions (Signed)
Follow with Primary MD [name], PA-C in 3 days   Get CBC, CMP, 2 view Chest X ray checked  by Primary MD in 3 days   Activity: As tolerated with Full fall precautions use walker/cane & assistance as needed  Disposition Home    Diet:   Diet heart healthy/carb modified  Accuchecks 4 times/day, Once in AM empty stomach and then before each meal. Log in all results and show them to your Prim.MD in 3 days. If any glucose reading is under 80 or above 300 call your Prim MD immidiately. Follow Low glucose instructions for glucose under 80 as instructed.  For Heart failure patients - Check your Weight same time everyday, if you gain over 2 pounds, or you develop in leg swelling, experience more shortness of breath or chest pain, call your Primary MD immediately. Follow Cardiac Low Salt Diet and 1.5 lit/day fluid restriction.  Special Instructions: If you have smoked or chewed Tobacco  in the last 2 yrs please stop smoking, stop any regular Alcohol  and or any Recreational drug use.  On your next visit with your primary care physician please Get Medicines reviewed and adjusted.  Please request your Prim.MD to go over all Hospital Tests and Procedure/Radiological results at the follow up, please get all Hospital records sent to your Prim MD by signing hospital release before you go home.  If you experience worsening of your admission symptoms, develop shortness of breath, life threatening emergency, suicidal or homicidal thoughts you must seek medical attention immediately by calling 911 or calling your MD immediately  if symptoms less severe.  You Must read complete instructions/literature along with all the possible adverse reactions/side effects for all the Medicines you take and that have been prescribed to you. Take any new Medicines after you have completely understood and accpet all the possible adverse reactions/side effects.   Do not drive, operate heavy machinery, perform  activities at heights, swimming or participation in water activities or provide baby sitting services if your were admitted for syncope or siezures until you have seen by Primary MD or a Neurologist and advised to do so again.  Do not drive when taking Pain medications.    Do not take more than prescribed Pain, Sleep and Anxiety Medications  Wear Seat belts while driving.   Please note  You were cared for by a hospitalist during your hospital stay. If you have any questions about your discharge medications or the care you received while you were in the hospital after you are discharged, you can call the unit and asked to speak with the hospitalist on call if the hospitalist that took care of you is not available. Once you are discharged, your primary care physician will handle any further medical issues. Please note that NO REFILLS for any discharge medications will be authorized once you are discharged, as it is imperative that you return to your primary care physician (or establish a relationship with a primary care physician if you do not have one) for your aftercare needs so that they can reassess your need for medications and monitor your lab values.

## 2017-03-04 NOTE — Progress Notes (Signed)
CSW received consult regarding meals on wheels program for patient. Unfortunately, patient does not meet 40 or older age requirements for the program and there are no other meal delivery agencies in [place].  Percell Locus Amye Grego LCSW [phone]

## 2017-03-04 NOTE — Progress Notes (Signed)
Discharge instructions reviewed with the patient to include current medications, medication changes, follow up appointments and activity.  Discussed ordered home health services with the patient.  Patient voices understanding to teaching.  Printed copies given to the patient.  Patient transported home via [place].

## 2017-03-04 NOTE — Discharge Summary (Signed)
[name]:[id number] DOB: 03-21-1965 DOA: 03/02/2017  PCP: [name], PA-C  Admit date: 03/02/2017  Discharge date: 03/04/2017  Admitted From: Home  Disposition:  Home   Recommendations for Outpatient Follow-up:   Follow up with PCP in 1-2 weeks  PCP Please obtain BMP/CBC, 2 view CXR in 1week,  (see Discharge instructions)   PCP Please follow up on the following pending results: None   Home Health: None Equipment/Devices: None  Consultations: None Discharge Condition: Fair   CODE STATUS: Full   Diet Recommendation: Diet heart healthy/carb modified    Chief Complaint  Patient presents with  . Shortness of Breath  . Hyperglycemia     Brief history of present illness from the day of admission and additional interim summary     Patient is a 52 y.o. female with prior history of COPD, DM-2 on insulin, HIV on antiretrovirals who was just discharged from this facility on 2/25 after being treated for COPD exacerbation presented with generalized malaise, vomiting, continued cough and wheezing, further evaluation revealed DKA-started on insulin infusion and admitted to the hospitalist service.  See below for further details                                                                   Hospital Course    Diabetic ketoacidosis patient with poorly controlled insulin-dependent DM type II: Questionable compliance with insulin and diet regimen, A1c was 8.5, she was initially kept on IV fluids and IV insulin drip with resolution of DKA, now she is less than half of her home dose insulin with sugars under 250 raising the possibility of severe dietary noncompliance.  She was counseled for the same.  She is currently symptom-free will be discharged home with PCP follow-up in 3 days, she has been requested to  do Accu-Cheks q. before meals at bedtime and document the results in a log book, showed results to PCP in 3 days for further adjustment if needed.  Acute kidney injury: Likely secondary to DKA-has resolved.  Leukocytosis: Reactionary improving no signs of infection.  Hypotension:  Due to dehydration resolved with IV fluids.  COPD: No evidence of exacerbation-lungs are clear and only a few scattered rhonchi continue bronchodilators.  Chronic hypoxemic respiratory failure: On home O2-continue usual regimen.  HIV: Last CD4 count 220 on 02/24/17-continue antiretrovirals.  Follows with Dr. Johnnye Sima in the outpatient setting  Depression/anxiety: Slightly anxious due to acute illness-but otherwise appears stable-continue with Abilify, doxepin, and Klonopin.  Peripheral vascular disease-status post left BKA  Deconditioning/debility: Chronic deconditioning/debility at baseline-could have worsened due to acute illness/liver and hospitalization-much improved after supportive care.     Discharge diagnosis     Principal Problem:   DKA (diabetic ketoacidoses) ([name]) Active Problems:   Mood disorder ([place])   COPD with  acute bronchitis ([place])   AKI (acute kidney injury) [facility])    Discharge instructions    Discharge Instructions    Discharge instructions   Complete by:  As directed    Follow with Primary MD [name], PA-C in 3 days   Get CBC, CMP, 2 view Chest X ray checked  by Primary MD in 3 days   Activity: As tolerated with Full fall precautions use walker/cane & assistance as needed  Disposition Home    Diet:   Diet heart healthy/carb modified  Accuchecks 4 times/day, Once in AM empty stomach and then before each meal. Log in all results and show them to your Prim.MD in 3 days. If any glucose reading is under 80 or above 300 call your Prim MD immidiately. Follow Low glucose instructions for glucose under 80 as instructed.  For Heart failure patients - Check your  Weight same time everyday, if you gain over 2 pounds, or you develop in leg swelling, experience more shortness of breath or chest pain, call your Primary MD immediately. Follow Cardiac Low Salt Diet and 1.5 lit/day fluid restriction.  Special Instructions: If you have smoked or chewed Tobacco  in the last 2 yrs please stop smoking, stop any regular Alcohol  and or any Recreational drug use.  On your next visit with your primary care physician please Get Medicines reviewed and adjusted.  Please request your Prim.MD to go over all Hospital Tests and Procedure/Radiological results at the follow up, please get all Hospital records sent to your Prim MD by signing hospital release before you go home.  If you experience worsening of your admission symptoms, develop shortness of breath, life threatening emergency, suicidal or homicidal thoughts you must seek medical attention immediately by calling 911 or calling your MD immediately  if symptoms less severe.  You Must read complete instructions/literature along with all the possible adverse reactions/side effects for all the Medicines you take and that have been prescribed to you. Take any new Medicines after you have completely understood and accpet all the possible adverse reactions/side effects.   Do not drive, operate heavy machinery, perform activities at heights, swimming or participation in water activities or provide baby sitting services if your were admitted for syncope or siezures until you have seen by Primary MD or a Neurologist and advised to do so again.  Do not drive when taking Pain medications.    Do not take more than prescribed Pain, Sleep and Anxiety Medications  Wear Seat belts while driving.   Please note  You were cared for by a hospitalist during your hospital stay. If you have any questions about your discharge medications or the care you received while you were in the hospital after you are discharged, you can call the  unit and asked to speak with the hospitalist on call if the hospitalist that took care of you is not available. Once you are discharged, your primary care physician will handle any further medical issues. Please note that NO REFILLS for any discharge medications will be authorized once you are discharged, as it is imperative that you return to your primary care physician (or establish a relationship with a primary care physician if you do not have one) for your aftercare needs so that they can reassess your need for medications and monitor your lab values.   Increase activity slowly   Complete by:  As directed       Discharge Medications   Allergies as of 03/04/2017  Reactions   Cortizone-10 [hydrocortisone] Rash   Per patient, given local injection at knee and developed rash at local site.       Medication List    STOP taking these medications   predniSONE 10 MG tablet Commonly known as:  DELTASONE     TAKE these medications   albuterol 108 (90 Base) MCG/ACT inhaler Commonly known as:  PROAIR HFA Inhale 2 puffs into the lungs every 6 (six) hours as needed for wheezing or shortness of breath.   ARIPiprazole 10 MG tablet Commonly known as:  ABILIFY Take 10 mg by mouth every morning.   aspirin 81 MG EC tablet Take 1 tablet (81 mg total) by mouth daily.   clonazePAM 1 MG tablet Commonly known as:  KLONOPIN Take 1 mg by mouth 3 (three) times daily as needed for anxiety.   diphenoxylate-atropine 2.5-0.025 MG tablet Commonly known as:  LOMOTIL Take 1 tablet by mouth 4 (four) times daily as needed for diarrhea or loose stools.   doxepin 10 MG capsule Commonly known as:  SINEQUAN Take 10 mg by mouth at bedtime.   doxycycline 100 MG capsule Commonly known as:  VIBRAMYCIN Take 1 capsule (100 mg total) by mouth 2 (two) times daily.   fenofibrate 145 MG tablet Commonly known as:  TRICOR Take 145 mg by mouth daily.   fluticasone furoate-vilanterol 100-25 MCG/INH  Aepb Commonly known as:  BREO ELLIPTA Inhale 1 puff into the lungs daily.   furosemide 80 MG tablet Commonly known as:  LASIX Take 80 mg by mouth daily.   GENVOYA 150-150-200-10 MG Tabs tablet Generic drug:  elvitegravir-cobicistat-emtricitabine-tenofovir TAKE 1 TABLET BY MOUTH EVERY DAY   HYDROcodone-acetaminophen 10-325 MG tablet Commonly known as:  NORCO Take 1 tablet by mouth every 8 (eight) hours as needed (pain).   Insulin Glargine 300 UNIT/ML Sopn Commonly known as:  TOUJEO SOLOSTAR Inject 65 Units into the skin daily. What changed:  how much to take   insulin regular human CONCENTRATED 500 UNIT/ML kwikpen Commonly known as:  HUMULIN R U-500 KWIKPEN INJECT 60 UNITS BEFORE BREAKFAST, 50 UNITS BEFORE LUNCH, AND 35 UNITS BEFORE DINNER.   liraglutide 18 MG/3ML Sopn Commonly known as:  VICTOZA INJECT 1.8 MG INTO THE SKIN DAILY What changed:    how much to take  how to take this  when to take this  additional instructions   MAGNESIUM-OXIDE 400 (241.3 Mg) MG tablet Generic drug:  magnesium oxide Take 400 mg by mouth daily.   NARCAN 4 MG/0.1ML Liqd nasal spray kit Generic drug:  naloxone Place 1 spray into the nose as needed. overdose   pantoprazole 40 MG tablet Commonly known as:  PROTONIX Take 40 mg by mouth daily.   potassium chloride 10 MEQ tablet Commonly known as:  K-DUR Take 1 tablet (10 mEq total) by mouth daily.   potassium chloride SA 20 MEQ tablet Commonly known as:  K-DUR,KLOR-CON Take 1 tablet (20 mEq total) by mouth daily. X 3 days, then check potassium levels if needed to continue What changed:  additional instructions   pregabalin 225 MG capsule Commonly known as:  LYRICA Take 225 mg by mouth 2 (two) times daily.   rosuvastatin 20 MG tablet Commonly known as:  CRESTOR Take 20 mg by mouth daily. Reported on 05/02/2015   SYMBICORT 80-4.5 MCG/ACT inhaler Generic drug:  budesonide-formoterol Inhale 1 puff into the lungs daily.    temazepam 15 MG capsule Commonly known as:  RESTORIL Take 30 mg by mouth at bedtime as  needed for sleep.   tiZANidine 4 MG tablet Commonly known as:  ZANAFLEX Take 4 mg by mouth daily.       Follow-up Information    [name], PA-C Follow up in 3 day(s).   Specialty:  Family Medicine Contact information: [place] [address] [phone]        Campbell Riches, MD .   Specialty:  Infectious Diseases Contact information: [place] [address] [phone]           Major procedures and Radiology Reports - PLEASE review detailed and final reports thoroughly  -       Dg Chest 2 View  Result Date: 03/02/2017 CLINICAL DATA:  Shortness of breath. EXAM: CHEST  2 VIEW COMPARISON:  CT scan and radiographs of February 26, 2017. FINDINGS: The heart size and mediastinal contours are within normal limits. Both lungs are clear. No pneumothorax or pleural effusion is noted. Old left rib fractures are noted. IMPRESSION: No active cardiopulmonary disease. Electronically Signed   By: Marijo Conception, M.D.   On: 03/02/2017 16:08      Micro Results     Recent Results (from the past 240 hour(s))  Blood culture (routine x 2)     Status: Abnormal   Collection Time: 02/23/17  5:45 PM  Result Value Ref Range Status   Specimen Description BLOOD RIGHT ANTECUBITAL  Final   Special Requests   Final    BOTTLES DRAWN AEROBIC AND ANAEROBIC Blood Culture adequate volume   Culture  Setup Time   Final    GRAM POSITIVE COCCI IN CLUSTERS IN CHAINS IN BOTH AEROBIC AND ANAEROBIC BOTTLES CRITICAL RESULT CALLED TO, READ BACK BY AND VERIFIED WITH: B MANCHERIL,PHARMD AT 1904 02/24/17 BY L BENFIELD    Culture (A)  Final    STAPHYLOCOCCUS SPECIES (COAGULASE NEGATIVE) GRANULICATELLA ADIACENS THE SIGNIFICANCE OF ISOLATING THIS ORGANISM FROM A SINGLE SET OF BLOOD CULTURES WHEN MULTIPLE SETS ARE DRAWN IS UNCERTAIN. PLEASE NOTIFY THE MICROBIOLOGY  DEPARTMENT WITHIN ONE WEEK IF SPECIATION AND SENSITIVITIES ARE REQUIRED. Performed at [facility], [place] [address], [place], [place] [address]    Report Status 02/26/2017 FINAL  Final  Blood Culture ID Panel (Reflexed)     Status: Abnormal   Collection Time: 02/23/17  5:45 PM  Result Value Ref Range Status   Enterococcus species NOT DETECTED NOT DETECTED Final   Listeria monocytogenes NOT DETECTED NOT DETECTED Final   Staphylococcus species DETECTED (A) NOT DETECTED Final    Comment: Methicillin (oxacillin) resistant coagulase negative staphylococcus. Possible blood culture contaminant (unless isolated from more than one blood culture draw or clinical case suggests pathogenicity). No antibiotic treatment is indicated for blood  culture contaminants. CRITICAL RESULT CALLED TO, READ BACK BY AND VERIFIED WITH: B MANCHERIL,PHARMD AT 1904 02/24/17 BY L BENFIELD    Staphylococcus aureus NOT DETECTED NOT DETECTED Final   Methicillin resistance DETECTED (A) NOT DETECTED Final    Comment: CRITICAL RESULT CALLED TO, READ BACK BY AND VERIFIED WITH: B MANCHERIL,PHARMD AT 1904 02/24/17 BY L BENFIELD    Streptococcus species NOT DETECTED NOT DETECTED Final   Streptococcus agalactiae NOT DETECTED NOT DETECTED Final   Streptococcus pneumoniae NOT DETECTED NOT DETECTED Final   Streptococcus pyogenes NOT DETECTED NOT DETECTED Final   Acinetobacter baumannii NOT DETECTED NOT DETECTED Final   Enterobacteriaceae species NOT DETECTED NOT DETECTED Final   Enterobacter cloacae complex NOT DETECTED NOT DETECTED Final   Escherichia coli  NOT DETECTED NOT DETECTED Final   Klebsiella oxytoca NOT DETECTED NOT DETECTED Final   Klebsiella pneumoniae NOT DETECTED NOT DETECTED Final   Proteus species NOT DETECTED NOT DETECTED Final   Serratia marcescens NOT DETECTED NOT DETECTED Final   Haemophilus influenzae NOT DETECTED NOT DETECTED Final   Neisseria meningitidis NOT DETECTED NOT DETECTED Final   Pseudomonas  aeruginosa NOT DETECTED NOT DETECTED Final   Candida albicans NOT DETECTED NOT DETECTED Final   Candida glabrata NOT DETECTED NOT DETECTED Final   Candida krusei NOT DETECTED NOT DETECTED Final   Candida parapsilosis NOT DETECTED NOT DETECTED Final   Candida tropicalis NOT DETECTED NOT DETECTED Final    Comment: Performed at [facility], [place] [address] [place], [place] [address]  MRSA PCR Screening     Status: None   Collection Time: 02/24/17 12:32 AM  Result Value Ref Range Status   MRSA by PCR NEGATIVE NEGATIVE Final    Comment:        The GeneXpert MRSA Assay (FDA approved for NASAL specimens only), is one component of a comprehensive MRSA colonization surveillance program. It is not intended to diagnose MRSA infection nor to guide or monitor treatment for MRSA infections. Performed at [facility], [place] [address], [place], [place] [address]   Blood culture (routine x 2)     Status: None   Collection Time: 02/24/17  1:00 AM  Result Value Ref Range Status   Specimen Description BLOOD RIGHT HAND  Final   Special Requests   Final    BOTTLES DRAWN AEROBIC AND ANAEROBIC Blood Culture results may not be optimal due to an inadequate volume of blood received in culture bottles   Culture   Final    NO GROWTH 5 DAYS Performed at [facility], [place] [address], [place], [place] [address]    Report Status 03/01/2017 FINAL  Final  MRSA PCR Screening     Status: None   Collection Time: 03/02/17  8:39 PM  Result Value Ref Range Status   MRSA by PCR NEGATIVE NEGATIVE Final    Comment:        The GeneXpert MRSA Assay (FDA approved for NASAL specimens only), is one component of a comprehensive MRSA colonization surveillance program. It is not intended to diagnose MRSA infection nor to guide or monitor treatment for MRSA infections. Performed at [facility], [place] [address] [place], [place] [address]     Today   Subjective    Jennette Leask today has  no headache,no chest abdominal pain,no new weakness tingling or numbness, feels much better wants to go home today.     Objective   Blood pressure 122/84, pulse 71, temperature 98.1 F (36.7 C), resp. rate (!) 23, height 5' 10"  (1.778 m), weight 97.5 kg (215 lb), last menstrual period 11/16/2010, SpO2 93 %.   Intake/Output Summary (Last 24 hours) at 03/04/2017 1212 Last data filed at 03/04/2017 0522 Gross per 24 hour  Intake 960 ml  Output 1250 ml  Net -290 ml    Exam Awake Alert, Oriented x 3, No new F.N deficits, Normal affect [place].AT,PERRAL Supple Neck,No JVD, No cervical lymphadenopathy appriciated.  Symmetrical Chest wall movement, Good air movement bilaterally, CTAB RRR,No Gallops,Rubs or new Murmurs, No Parasternal Heave +ve B.Sounds, Abd Soft, Non tender, No organomegaly appriciated, No rebound -guarding or rigidity. No Cyanosis, Clubbing or edema, No new Rash or bruise, L BKA stump stable   Data Review   CBC  w Diff:  Lab Results  Component Value Date   WBC 11.9 (H) 03/04/2017   HGB 12.4 03/04/2017   HCT 38.3 03/04/2017   PLT 214 03/04/2017   LYMPHOPCT 26 03/03/2017   MONOPCT 4 03/03/2017   EOSPCT 2 03/03/2017   BASOPCT 0 03/03/2017    CMP:  Lab Results  Component Value Date   NA 136 03/04/2017   K 4.0 03/04/2017   CL 103 03/04/2017   CO2 24 03/04/2017   BUN 16 03/04/2017   CREATININE 1.13 (H) 03/04/2017   CREATININE 1.29 (H) 11/20/2016   PROT 6.8 03/02/2017   ALBUMIN 3.4 (L) 03/02/2017   BILITOT 1.0 03/02/2017   ALKPHOS 78 03/02/2017   AST 20 03/02/2017   ALT 29 03/02/2017  . Lab Results  Component Value Date   HGBA1C 8.6 (H) 03/03/2017     Total Time in preparing paper work, data evaluation and todays exam - 14 minutes  Lala Lund M.D on 03/04/2017 at 12:12 PM  [facility]   Office  [phone]

## 2017-03-06 DIAGNOSIS — J441 Chronic obstructive pulmonary disease with (acute) exacerbation: Secondary | ICD-10-CM | POA: Diagnosis not present

## 2017-03-06 DIAGNOSIS — E1140 Type 2 diabetes mellitus with diabetic neuropathy, unspecified: Secondary | ICD-10-CM | POA: Diagnosis not present

## 2017-03-06 DIAGNOSIS — I130 Hypertensive heart and chronic kidney disease with heart failure and stage 1 through stage 4 chronic kidney disease, or unspecified chronic kidney disease: Secondary | ICD-10-CM | POA: Diagnosis not present

## 2017-03-06 DIAGNOSIS — I509 Heart failure, unspecified: Secondary | ICD-10-CM | POA: Diagnosis not present

## 2017-03-06 DIAGNOSIS — J9611 Chronic respiratory failure with hypoxia: Secondary | ICD-10-CM | POA: Diagnosis not present

## 2017-03-06 DIAGNOSIS — N184 Chronic kidney disease, stage 4 (severe): Secondary | ICD-10-CM | POA: Diagnosis not present

## 2017-03-06 DIAGNOSIS — E1122 Type 2 diabetes mellitus with diabetic chronic kidney disease: Secondary | ICD-10-CM | POA: Diagnosis not present

## 2017-03-06 DIAGNOSIS — E1165 Type 2 diabetes mellitus with hyperglycemia: Secondary | ICD-10-CM | POA: Diagnosis not present

## 2017-03-06 DIAGNOSIS — E1151 Type 2 diabetes mellitus with diabetic peripheral angiopathy without gangrene: Secondary | ICD-10-CM | POA: Diagnosis not present

## 2017-03-06 NOTE — Progress Notes (Deleted)
Patient ID: [name], female   DOB: 28-Jul-1965, 52 y.o.   MRN: [id number]           Reason for Appointment: Follow-up  for Type 2 Diabetes   History of Present Illness:          Date of diagnosis of type 2 diabetes mellitus: 2000       Background history:     The patient was apparently diagnosed incidentally with her diabetes 16 years ago 16 years ago, was asymptomatic. She took metformin and possibly other oral hypoglycemic drugs but subsequently went on insulin in 2005 She was taking various insulin regimens in the past  She was being treated by an endocrinologist some time ago and he switched her To Toujeo and also tried her on Byetta.  She thinks that she had better blood sugars with Byetta but she did not follow-up  Novolog was switched to U-500 insulin in 3/17   Recent history:   INSULIN regimen is:  Toujeo 65 units bid  Humulin R U-500: 50 units am, 40 units before lunch 30 before supper  Non-insulin hypoglycemic drugs the patient is taking are: Victoza 1.33m    Her A1c is relatively better at 8.5, previously 9.8  Current management, blood sugar patterns and problems identified:  She has had another hospitalization for COPD again and was discharged on prednisone, currently on a tapering dose with 40 mg today  However she has checked her blood sugar very sporadically and generally mostly in the first part of the day  She also said that prior to her hospitalization she was taking her smallest dose of Humulin R in the morning and largest dose late in the evening with occasional episodes of low blood sugars later at night and higher readings in the afternoon  Although she is generally eating 2 meals a day she is drinking a Slim fast type of drink in the morning with 20 g of sugar  Unable to check her weight but it appears to be fluctuating  TOUJEO dose was reduced by 5 units on the last visit  She thinks she is taking her Victoza daily     Side effects from  medications have been: None  Compliance with the medical regimen:  fair Hypoglycemia:  rarely low at 4-6pm Glucose monitoring:  done about 1 times a day         Glucometer: One Touch Verio.       Blood Glucose readings as follows  Blood sugars from July showed median of 136 and blood sugar range 47-334 Blood sugars in the last 2 days: 121 fasting today, yesterday 86 and 99 and previously on 8/22 594 fasting    Self-care:   Typical meal intake: Breakfast is mostly a yogurt or Slim fast   Lunch is variable at 3 pm, has mixed meal at dinnertime, sometimes skipped Has variable amounts of snacks.  She is avoiding drinks with sugar                [facility]              Exercise:  none  Weight history:  Wt Readings from Last 3 Encounters:  03/02/17 215 lb (97.5 kg)  02/24/17 215 lb 9.8 oz (97.8 kg)  01/14/17 236 lb (107 kg)    Glycemic control: 11 in 8/16   Lab Results  Component Value Date   HGBA1C 8.6 (H) 03/03/2017   HGBA1C 8.5 08/24/2016  HGBA1C 9.8 (H) 05/20/2016   Lab Results  Component Value Date   MICROALBUR 1.2 03/05/2016   LDLCALC 59 06/12/2016   CREATININE 1.13 (H) 03/04/2017    Other active problems: See review of systems    Allergies as of 03/07/2017      Reactions   Cortizone-10 [hydrocortisone] Rash   Per patient, given local injection at knee and developed rash at local site.       Medication List        Accurate as of 03/06/17  8:22 PM. Always use your most recent med list.          albuterol 108 (90 Base) MCG/ACT inhaler Commonly known as:  PROAIR HFA Inhale 2 puffs into the lungs every 6 (six) hours as needed for wheezing or shortness of breath.   ARIPiprazole 10 MG tablet Commonly known as:  ABILIFY Take 10 mg by mouth every morning.   aspirin 81 MG EC tablet Take 1 tablet (81 mg total) by mouth daily.   clonazePAM 1 MG tablet Commonly known as:  KLONOPIN Take 1 mg by mouth 3 (three) times  daily as needed for anxiety.   diphenoxylate-atropine 2.5-0.025 MG tablet Commonly known as:  LOMOTIL Take 1 tablet by mouth 4 (four) times daily as needed for diarrhea or loose stools.   doxepin 10 MG capsule Commonly known as:  SINEQUAN Take 10 mg by mouth at bedtime.   doxycycline 100 MG capsule Commonly known as:  VIBRAMYCIN Take 1 capsule (100 mg total) by mouth 2 (two) times daily.   fenofibrate 145 MG tablet Commonly known as:  TRICOR Take 145 mg by mouth daily.   fluticasone furoate-vilanterol 100-25 MCG/INH Aepb Commonly known as:  BREO ELLIPTA Inhale 1 puff into the lungs daily.   furosemide 80 MG tablet Commonly known as:  LASIX Take 80 mg by mouth daily.   GENVOYA 150-150-200-10 MG Tabs tablet Generic drug:  elvitegravir-cobicistat-emtricitabine-tenofovir TAKE 1 TABLET BY MOUTH EVERY DAY   HYDROcodone-acetaminophen 10-325 MG tablet Commonly known as:  NORCO Take 1 tablet by mouth every 8 (eight) hours as needed (pain).   Insulin Glargine 300 UNIT/ML Sopn Commonly known as:  TOUJEO SOLOSTAR Inject 65 Units into the skin daily.   insulin regular human CONCENTRATED 500 UNIT/ML kwikpen Commonly known as:  HUMULIN R U-500 KWIKPEN INJECT 60 UNITS BEFORE BREAKFAST, 50 UNITS BEFORE LUNCH, AND 35 UNITS BEFORE DINNER.   liraglutide 18 MG/3ML Sopn Commonly known as:  VICTOZA INJECT 1.8 MG INTO THE SKIN DAILY   MAGNESIUM-OXIDE 400 (241.3 Mg) MG tablet Generic drug:  magnesium oxide Take 400 mg by mouth daily.   NARCAN 4 MG/0.1ML Liqd nasal spray kit Generic drug:  naloxone Place 1 spray into the nose as needed. overdose   pantoprazole 40 MG tablet Commonly known as:  PROTONIX Take 40 mg by mouth daily.   potassium chloride 10 MEQ tablet Commonly known as:  K-DUR Take 1 tablet (10 mEq total) by mouth daily.   potassium chloride SA 20 MEQ tablet Commonly known as:  K-DUR,KLOR-CON Take 1 tablet (20 mEq total) by mouth daily. X 3 days, then check potassium  levels if needed to continue   pregabalin 225 MG capsule Commonly known as:  LYRICA Take 225 mg by mouth 2 (two) times daily.   rosuvastatin 20 MG tablet Commonly known as:  CRESTOR Take 20 mg by mouth daily. Reported on 05/02/2015   SYMBICORT 80-4.5 MCG/ACT inhaler Generic drug:  budesonide-formoterol Inhale 1 puff into the lungs daily.  temazepam 15 MG capsule Commonly known as:  RESTORIL Take 30 mg by mouth at bedtime as needed for sleep.   tiZANidine 4 MG tablet Commonly known as:  ZANAFLEX Take 4 mg by mouth daily.       Allergies:  Allergies  Allergen Reactions  . Cortizone-10 [Hydrocortisone] Rash    Per patient, given local injection at knee and developed rash at local site.     Past Medical History:  Diagnosis Date  . Acute metabolic encephalopathy 02/19/7586  . Acute on chronic respiratory failure with hypoxia ([place]) 05/19/2016  . Anxiety   . ARF (acute renal failure) ([place]) 05/03/2014  . Arthritis    LEFT HIP  . Asthma    HOSPITALIZED WITH EXCERBATION OF ASTHMA - AND BRONCHITIS AND THE FLU DEC 2013  . Asthma exacerbation attacks 12/28/2011  . Bronchitis 12/28/2011  . CAP (community acquired pneumonia) 07/25/2012  . Chronic kidney disease (CKD), stage IV (severe) ([place]) 11/10/2014  . Class 3 obesity due to excess calories with serious comorbidity and body mass index (BMI) of 40.0 to 44.9 in adult   . Colles' fracture of left radius   . Depression   . Diabetes mellitus    ON INSULIN AND ORAL MEDICATIONS  . Diabetes mellitus type 2 with complications, uncontrolled ([place]) 04/19/2008   Qualifier: Diagnosis of  By: Tomma Lightning MD, Claiborne Billings     . Diabetic foot infection ([place]) 11/09/2014  . Diabetic ketoacidosis without coma associated with type 2 diabetes mellitus ([place])   . Diabetic neuropathy ([place]) 11/10/2014  . Diarrhea 08/30/2008   Qualifier: Diagnosis of  By: Tomma Lightning MD, Claiborne Billings    . Diastolic dysfunction 32/05/4980  . Difficult intravenous access   . DKA, type 2 ([place])  04/04/2016  . Dyslipidemia 12/23/2008   Qualifier: Diagnosis of  By: Tomma Lightning MD, Claiborne Billings    . Essential hypertension 04/19/2008   Qualifier: Diagnosis of  By: Tomma Lightning MD, Claiborne Billings    . FATIGUE 07/12/2008   Qualifier: Diagnosis of  By: Tomma Lightning MD, Claiborne Billings    . Femoral neck fracture ([place]) 07/16/2011  . Femur fracture, left ([place]) 07/08/2014  . Fever   . Fracture of distal femur ([place]) 07/08/2014  . GERD (gastroesophageal reflux disease)   . Hereditary and idiopathic peripheral neuropathy 04/19/2008   Qualifier: Diagnosis of  By: Tomma Lightning MD, Claiborne Billings    . Hip fracture, left ([place]) 07/16/2011  . Hip pain   . HIV infection ([place]) 2000  . Human immunodeficiency virus (HIV) disease ([place]) 04/19/2008   [id number] +   . Hyperlipidemia   . Hypertension   . Hyponatremia 12/29/2011  . Influenza A 12/29/2011  . Influenza B   . Insomnia 06/12/2016  . Left hip pain 05/03/2014  . Mood disorder ([place]) 04/19/2008   Qualifier: Diagnosis of  By: Tomma Lightning MD, Claiborne Billings    . Neuropathy    NEUROPATHY HANDS AND FEET  . NSTEMI (non-ST elevated myocardial infarction) ([place]) 05/19/2016  . Obesity hypoventilation syndrome ([place])   . Osteoporosis 07/08/2014  . Pain    SEVERE PAIN LEFT HIP - HX OF LEFT HIP PINNING JULY 2013  . Perimenopausal symptoms 02/13/2012   LMP around 2011. On estrace and provera since around 2012 for hot flashes, mood swings. Estrace 2 mg daily, Provera 2.5 mg for 5 days each month.   . Repeated falls 07/30/2011  . Sepsis ([place]) 04/04/2016  . Shortness of breath    ALLERGIES ARE "ACTING UP"  . SOB (shortness of breath)   . THRUSH 05/20/2008  Qualifier: Diagnosis of  By: Tomma Lightning MD, Claiborne Billings      . Tobacco use disorder 04/14/2010    Past Surgical History:  Procedure Laterality Date  . AMPUTATION Left 11/18/2014   Procedure: LEFT BELOW KNEE AMPUTATION;  Surgeon: Leandrew Koyanagi, MD;  Location: [facility];  Service: Orthopedics;  Laterality: Left;  . CAST APPLICATION Left 01/06/1094   Procedure: CAST APPLICATION (FIBERGLASS);   Surgeon: Latanya Maudlin, MD;  Location: [facility];  Service: Orthopedics;  Laterality: Left;  CLOSED REDUCTION OF LEFT COLLES FRACTURE WITH SHORT ARM CAST  . HARDWARE REMOVAL Left 05/05/2014   Procedure: HARDWARE REMOVAL LEFT HIP;  Surgeon: Latanya Maudlin, MD;  Location: [facility];  Service: Orthopedics;  Laterality: Left;  REMOVAL BIOMET 6.5-8.0 CANNULATED SCREW  . HIP ARTHROPLASTY Left 05/05/2014   Procedure: ARTHROPLASTY OPEN REDUCTION INTERNAL FIXATION LEFT HIP AND REMOVAL OF TWO CANNULATED SCREW;  Surgeon: Latanya Maudlin, MD;  Location: [facility];  Service: Orthopedics;  Laterality: Left;  . HIP PINNING,CANNULATED  07/16/2011   Procedure: CANNULATED HIP PINNING;  Surgeon: Gearlean Alf, MD;  Location: [facility];  Service: Orthopedics;  Laterality: Left;  . HIP PINNING,CANNULATED Left 04/09/2012   Procedure: CANNULATED HIP PINNING AND HARDWARE REVISION;  Surgeon: Gearlean Alf, MD;  Location: [facility];  Service: Orthopedics;  Laterality: Left;  . I&D EXTREMITY Left 11/16/2014   Procedure:  DEBRIDEMENT OF LEFT FOOT POSSIBLE APPLICATION OF INTEGRIA AND VAC ;  Surgeon: Irene Limbo, MD;  Location: [facility];  Service: Plastics;  Laterality: Left;  . PERIPHERAL VASCULAR CATHETERIZATION N/A 11/15/2014   Procedure: Abdominal Aortogram;  Surgeon: Conrad Launi[place]oko, MD;  Location: [facility];  Service: Cardiovascular;  Laterality: N/A;  . PERIPHERAL VASCULAR CATHETERIZATION  11/15/2014   Procedure: Lower Extremity Angiography;  Surgeon: Conrad [place], MD;  Location: [facility];  Service: Cardiovascular;;  . PERIPHERAL VASCULAR CATHETERIZATION Left 11/15/2014   Procedure: Peripheral Vascular Intervention;  Surgeon: Conrad [place], MD;  Location: [facility];  Service: Cardiovascular;  Laterality: Left;  popliteal artery stenting    Family History  Problem Relation Age of Onset  . Diabetes Mother   . Hypertension Mother   . Vision loss Mother   . Heart disease Mother   . Hypertension Father   .  Thyroid disease Neg Hx     Social History:  reports that she has been smoking e-cigarettes and cigarettes.  She has a 17.50 pack-year smoking history. she has never used smokeless tobacco. She reports that she does not drink alcohol or use drugs.    Review of Systems   RENAL dysfunction: Appears better recently  Lab Results  Component Value Date   CREATININE 1.13 (H) 03/04/2017   BUN 16 03/04/2017   NA 136 03/04/2017   K 4.0 03/04/2017   CL 103 03/04/2017   CO2 24 03/04/2017     Lipid history: Has been treated with  Fenofibrate and Crestor by PCP     Lab Results  Component Value Date   CHOL 139 06/12/2016   HDL 49 (L) 06/12/2016   LDLCALC 59 06/12/2016   TRIG 154 (H) 06/12/2016   CHOLHDL 2.8 06/12/2016          She takes multiple psychotropic drugs   Physical Examination:  LMP 11/16/2010       ASSESSMENT:  Diabetes type 2, uncontrolled    See history of present illness for detailed discussion of current diabetes management, blood sugar patterns and problems identified  A1c  is 8.5 but this is better than usual  Her blood sugars have been Again quite variable and difficult to know what her patterns are since she is checking rarely irregularly Her mealtimes and carbohydrate intake is also quite variable from day to day Also now she is on a prednisone taper making it difficult to assess her insulin requirement Does appear to be needing relatively smaller amounts of basal insulin and more insulin to cover her midday and afternoon hyperglycemia with the U-500 insulin She has only started taking her insulin as prescribed the last couple of days and blood sugars appear to be better Also on Victoza   PLAN:   She will try to check blood sugars consistently 2-3 times a day either on waking up or after meals including at bedtime No change in insulin at the moment She will continue 65 units of Toujeo but consider reducing it further if she is waking up at low readings  after stopping prednisone She'll continue the same regimen for the Humalog for now but we need to make adjustments based on her blood sugar patterns on the next visit    There are no Patient Instructions on file for this visit.   Elayne Snare 03/06/2017, 8:22 PM   Note: This office note was prepared with Dragon voice recognition system technology. Any transcriptional errors that result from this process are unintentional.

## 2017-03-07 ENCOUNTER — Ambulatory Visit: Payer: Medicare HMO | Admitting: Endocrinology

## 2017-03-08 ENCOUNTER — Other Ambulatory Visit: Payer: Self-pay | Admitting: Pulmonary Disease

## 2017-03-08 DIAGNOSIS — I509 Heart failure, unspecified: Secondary | ICD-10-CM | POA: Diagnosis not present

## 2017-03-08 DIAGNOSIS — I130 Hypertensive heart and chronic kidney disease with heart failure and stage 1 through stage 4 chronic kidney disease, or unspecified chronic kidney disease: Secondary | ICD-10-CM | POA: Diagnosis not present

## 2017-03-08 DIAGNOSIS — N184 Chronic kidney disease, stage 4 (severe): Secondary | ICD-10-CM | POA: Diagnosis not present

## 2017-03-08 DIAGNOSIS — E1165 Type 2 diabetes mellitus with hyperglycemia: Secondary | ICD-10-CM | POA: Diagnosis not present

## 2017-03-08 DIAGNOSIS — E1151 Type 2 diabetes mellitus with diabetic peripheral angiopathy without gangrene: Secondary | ICD-10-CM | POA: Diagnosis not present

## 2017-03-08 DIAGNOSIS — J9611 Chronic respiratory failure with hypoxia: Secondary | ICD-10-CM | POA: Diagnosis not present

## 2017-03-08 DIAGNOSIS — E1122 Type 2 diabetes mellitus with diabetic chronic kidney disease: Secondary | ICD-10-CM | POA: Diagnosis not present

## 2017-03-08 DIAGNOSIS — J441 Chronic obstructive pulmonary disease with (acute) exacerbation: Secondary | ICD-10-CM | POA: Diagnosis not present

## 2017-03-08 DIAGNOSIS — E1140 Type 2 diabetes mellitus with diabetic neuropathy, unspecified: Secondary | ICD-10-CM | POA: Diagnosis not present

## 2017-03-10 NOTE — Progress Notes (Deleted)
Patient ID: [name], female   DOB: 28-Jul-1965, 52 y.o.   MRN: [id number]           Reason for Appointment: Follow-up  for Type 2 Diabetes   History of Present Illness:          Date of diagnosis of type 2 diabetes mellitus: 2000       Background history:     The patient was apparently diagnosed incidentally with her diabetes 16 years ago 16 years ago, was asymptomatic. She took metformin and possibly other oral hypoglycemic drugs but subsequently went on insulin in 2005 She was taking various insulin regimens in the past  She was being treated by an endocrinologist some time ago and he switched her To Toujeo and also tried her on Byetta.  She thinks that she had better blood sugars with Byetta but she did not follow-up  Novolog was switched to U-500 insulin in 3/17   Recent history:   INSULIN regimen is:  Toujeo 65 units bid  Humulin R U-500: 50 units am, 40 units before lunch 30 before supper  Non-insulin hypoglycemic drugs the patient is taking are: Victoza 1.33m    Her A1c is relatively better at 8.5, previously 9.8  Current management, blood sugar patterns and problems identified:  She has had another hospitalization for COPD again and was discharged on prednisone, currently on a tapering dose with 40 mg today  However she has checked her blood sugar very sporadically and generally mostly in the first part of the day  She also said that prior to her hospitalization she was taking her smallest dose of Humulin R in the morning and largest dose late in the evening with occasional episodes of low blood sugars later at night and higher readings in the afternoon  Although she is generally eating 2 meals a day she is drinking a Slim fast type of drink in the morning with 20 g of sugar  Unable to check her weight but it appears to be fluctuating  TOUJEO dose was reduced by 5 units on the last visit  She thinks she is taking her Victoza daily     Side effects from  medications have been: None  Compliance with the medical regimen:  fair Hypoglycemia:  rarely low at 4-6pm Glucose monitoring:  done about 1 times a day         Glucometer: One Touch Verio.       Blood Glucose readings as follows  Blood sugars from July showed median of 136 and blood sugar range 47-334 Blood sugars in the last 2 days: 121 fasting today, yesterday 86 and 99 and previously on 8/22 594 fasting    Self-care:   Typical meal intake: Breakfast is mostly a yogurt or Slim fast   Lunch is variable at 3 pm, has mixed meal at dinnertime, sometimes skipped Has variable amounts of snacks.  She is avoiding drinks with sugar                [facility]              Exercise:  none  Weight history:  Wt Readings from Last 3 Encounters:  03/02/17 215 lb (97.5 kg)  02/24/17 215 lb 9.8 oz (97.8 kg)  01/14/17 236 lb (107 kg)    Glycemic control: 11 in 8/16   Lab Results  Component Value Date   HGBA1C 8.6 (H) 03/03/2017   HGBA1C 8.5 08/24/2016  HGBA1C 9.8 (H) 05/20/2016   Lab Results  Component Value Date   MICROALBUR 1.2 03/05/2016   LDLCALC 59 06/12/2016   CREATININE 1.13 (H) 03/04/2017    Other active problems: See review of systems    Allergies as of 03/11/2017      Reactions   Cortizone-10 [hydrocortisone] Rash   Per patient, given local injection at knee and developed rash at local site.       Medication List        Accurate as of 03/10/17  9:23 PM. Always use your most recent med list.          albuterol 108 (90 Base) MCG/ACT inhaler Commonly known as:  PROVENTIL HFA;VENTOLIN HFA INHALE 2 PUFFS INTO THE LUNGS EVERY 6 HOURS AS NEEDED FOR WHEEZING OR SHORTNESS OF BREATH   ARIPiprazole 10 MG tablet Commonly known as:  ABILIFY Take 10 mg by mouth every morning.   aspirin 81 MG EC tablet Take 1 tablet (81 mg total) by mouth daily.   clonazePAM 1 MG tablet Commonly known as:  KLONOPIN Take 1 mg by mouth 3 (three)  times daily as needed for anxiety.   diphenoxylate-atropine 2.5-0.025 MG tablet Commonly known as:  LOMOTIL Take 1 tablet by mouth 4 (four) times daily as needed for diarrhea or loose stools.   doxepin 10 MG capsule Commonly known as:  SINEQUAN Take 10 mg by mouth at bedtime.   doxycycline 100 MG capsule Commonly known as:  VIBRAMYCIN Take 1 capsule (100 mg total) by mouth 2 (two) times daily.   fenofibrate 145 MG tablet Commonly known as:  TRICOR Take 145 mg by mouth daily.   fluticasone furoate-vilanterol 100-25 MCG/INH Aepb Commonly known as:  BREO ELLIPTA Inhale 1 puff into the lungs daily.   furosemide 80 MG tablet Commonly known as:  LASIX Take 80 mg by mouth daily.   GENVOYA 150-150-200-10 MG Tabs tablet Generic drug:  elvitegravir-cobicistat-emtricitabine-tenofovir TAKE 1 TABLET BY MOUTH EVERY DAY   HYDROcodone-acetaminophen 10-325 MG tablet Commonly known as:  NORCO Take 1 tablet by mouth every 8 (eight) hours as needed (pain).   Insulin Glargine 300 UNIT/ML Sopn Commonly known as:  TOUJEO SOLOSTAR Inject 65 Units into the skin daily.   insulin regular human CONCENTRATED 500 UNIT/ML kwikpen Commonly known as:  HUMULIN R U-500 KWIKPEN INJECT 60 UNITS BEFORE BREAKFAST, 50 UNITS BEFORE LUNCH, AND 35 UNITS BEFORE DINNER.   liraglutide 18 MG/3ML Sopn Commonly known as:  VICTOZA INJECT 1.8 MG INTO THE SKIN DAILY   MAGNESIUM-OXIDE 400 (241.3 Mg) MG tablet Generic drug:  magnesium oxide Take 400 mg by mouth daily.   NARCAN 4 MG/0.1ML Liqd nasal spray kit Generic drug:  naloxone Place 1 spray into the nose as needed. overdose   pantoprazole 40 MG tablet Commonly known as:  PROTONIX Take 40 mg by mouth daily.   potassium chloride 10 MEQ tablet Commonly known as:  K-DUR Take 1 tablet (10 mEq total) by mouth daily.   potassium chloride SA 20 MEQ tablet Commonly known as:  K-DUR,KLOR-CON Take 1 tablet (20 mEq total) by mouth daily. X 3 days, then check  potassium levels if needed to continue   pregabalin 225 MG capsule Commonly known as:  LYRICA Take 225 mg by mouth 2 (two) times daily.   rosuvastatin 20 MG tablet Commonly known as:  CRESTOR Take 20 mg by mouth daily. Reported on 05/02/2015   SYMBICORT 80-4.5 MCG/ACT inhaler Generic drug:  budesonide-formoterol Inhale 1 puff into the lungs daily.  temazepam 15 MG capsule Commonly known as:  RESTORIL Take 30 mg by mouth at bedtime as needed for sleep.   tiZANidine 4 MG tablet Commonly known as:  ZANAFLEX Take 4 mg by mouth daily.       Allergies:  Allergies  Allergen Reactions  . Cortizone-10 [Hydrocortisone] Rash    Per patient, given local injection at knee and developed rash at local site.     Past Medical History:  Diagnosis Date  . Acute metabolic encephalopathy 3/81/0175  . Acute on chronic respiratory failure with hypoxia ([place]) 05/19/2016  . Anxiety   . ARF (acute renal failure) ([place]) 05/03/2014  . Arthritis    LEFT HIP  . Asthma    HOSPITALIZED WITH EXCERBATION OF ASTHMA - AND BRONCHITIS AND THE FLU DEC 2013  . Asthma exacerbation attacks 12/28/2011  . Bronchitis 12/28/2011  . CAP (community acquired pneumonia) 07/25/2012  . Chronic kidney disease (CKD), stage IV (severe) ([place]) 11/10/2014  . Class 3 obesity due to excess calories with serious comorbidity and body mass index (BMI) of 40.0 to 44.9 in adult   . Colles' fracture of left radius   . Depression   . Diabetes mellitus    ON INSULIN AND ORAL MEDICATIONS  . Diabetes mellitus type 2 with complications, uncontrolled ([place]) 04/19/2008   Qualifier: Diagnosis of  By: Tomma Lightning MD, Claiborne Billings     . Diabetic foot infection ([place]) 11/09/2014  . Diabetic ketoacidosis without coma associated with type 2 diabetes mellitus ([place])   . Diabetic neuropathy ([place]) 11/10/2014  . Diarrhea 08/30/2008   Qualifier: Diagnosis of  By: Tomma Lightning MD, Claiborne Billings    . Diastolic dysfunction 10/03/5850  . Difficult intravenous access   . DKA, type 2  ([place]) 04/04/2016  . Dyslipidemia 12/23/2008   Qualifier: Diagnosis of  By: Tomma Lightning MD, Claiborne Billings    . Essential hypertension 04/19/2008   Qualifier: Diagnosis of  By: Tomma Lightning MD, Claiborne Billings    . FATIGUE 07/12/2008   Qualifier: Diagnosis of  By: Tomma Lightning MD, Claiborne Billings    . Femoral neck fracture ([place]) 07/16/2011  . Femur fracture, left ([place]) 07/08/2014  . Fever   . Fracture of distal femur ([place]) 07/08/2014  . GERD (gastroesophageal reflux disease)   . Hereditary and idiopathic peripheral neuropathy 04/19/2008   Qualifier: Diagnosis of  By: Tomma Lightning MD, Claiborne Billings    . Hip fracture, left ([place]) 07/16/2011  . Hip pain   . HIV infection ([place]) 2000  . Human immunodeficiency virus (HIV) disease ([place]) 04/19/2008   [id number] +   . Hyperlipidemia   . Hypertension   . Hyponatremia 12/29/2011  . Influenza A 12/29/2011  . Influenza B   . Insomnia 06/12/2016  . Left hip pain 05/03/2014  . Mood disorder ([place]) 04/19/2008   Qualifier: Diagnosis of  By: Tomma Lightning MD, Claiborne Billings    . Neuropathy    NEUROPATHY HANDS AND FEET  . NSTEMI (non-ST elevated myocardial infarction) ([place]) 05/19/2016  . Obesity hypoventilation syndrome ([place])   . Osteoporosis 07/08/2014  . Pain    SEVERE PAIN LEFT HIP - HX OF LEFT HIP PINNING JULY 2013  . Perimenopausal symptoms 02/13/2012   LMP around 2011. On estrace and provera since around 2012 for hot flashes, mood swings. Estrace 2 mg daily, Provera 2.5 mg for 5 days each month.   . Repeated falls 07/30/2011  . Sepsis ([place]) 04/04/2016  . Shortness of breath    ALLERGIES ARE "ACTING UP"  . SOB (shortness of breath)   . THRUSH 05/20/2008  Qualifier: Diagnosis of  By: Tomma Lightning MD, Claiborne Billings      . Tobacco use disorder 04/14/2010    Past Surgical History:  Procedure Laterality Date  . AMPUTATION Left 11/18/2014   Procedure: LEFT BELOW KNEE AMPUTATION;  Surgeon: Leandrew Koyanagi, MD;  Location: [facility];  Service: Orthopedics;  Laterality: Left;  . CAST APPLICATION Left 01/06/1094   Procedure: CAST APPLICATION (FIBERGLASS);   Surgeon: Latanya Maudlin, MD;  Location: [facility];  Service: Orthopedics;  Laterality: Left;  CLOSED REDUCTION OF LEFT COLLES FRACTURE WITH SHORT ARM CAST  . HARDWARE REMOVAL Left 05/05/2014   Procedure: HARDWARE REMOVAL LEFT HIP;  Surgeon: Latanya Maudlin, MD;  Location: [facility];  Service: Orthopedics;  Laterality: Left;  REMOVAL BIOMET 6.5-8.0 CANNULATED SCREW  . HIP ARTHROPLASTY Left 05/05/2014   Procedure: ARTHROPLASTY OPEN REDUCTION INTERNAL FIXATION LEFT HIP AND REMOVAL OF TWO CANNULATED SCREW;  Surgeon: Latanya Maudlin, MD;  Location: [facility];  Service: Orthopedics;  Laterality: Left;  . HIP PINNING,CANNULATED  07/16/2011   Procedure: CANNULATED HIP PINNING;  Surgeon: Gearlean Alf, MD;  Location: [facility];  Service: Orthopedics;  Laterality: Left;  . HIP PINNING,CANNULATED Left 04/09/2012   Procedure: CANNULATED HIP PINNING AND HARDWARE REVISION;  Surgeon: Gearlean Alf, MD;  Location: [facility];  Service: Orthopedics;  Laterality: Left;  . I&D EXTREMITY Left 11/16/2014   Procedure:  DEBRIDEMENT OF LEFT FOOT POSSIBLE APPLICATION OF INTEGRIA AND VAC ;  Surgeon: Irene Limbo, MD;  Location: [facility];  Service: Plastics;  Laterality: Left;  . PERIPHERAL VASCULAR CATHETERIZATION N/A 11/15/2014   Procedure: Abdominal Aortogram;  Surgeon: Conrad Drummond, MD;  Location: [facility];  Service: Cardiovascular;  Laterality: N/A;  . PERIPHERAL VASCULAR CATHETERIZATION  11/15/2014   Procedure: Lower Extremity Angiography;  Surgeon: Conrad Seneca, MD;  Location: [facility];  Service: Cardiovascular;;  . PERIPHERAL VASCULAR CATHETERIZATION Left 11/15/2014   Procedure: Peripheral Vascular Intervention;  Surgeon: Conrad Christmas, MD;  Location: [facility];  Service: Cardiovascular;  Laterality: Left;  popliteal artery stenting    Family History  Problem Relation Age of Onset  . Diabetes Mother   . Hypertension Mother   . Vision loss Mother   . Heart disease Mother   . Hypertension Father   .  Thyroid disease Neg Hx     Social History:  reports that she has been smoking e-cigarettes and cigarettes.  She has a 17.50 pack-year smoking history. she has never used smokeless tobacco. She reports that she does not drink alcohol or use drugs.    Review of Systems   RENAL dysfunction: Appears better recently  Lab Results  Component Value Date   CREATININE 1.13 (H) 03/04/2017   BUN 16 03/04/2017   NA 136 03/04/2017   K 4.0 03/04/2017   CL 103 03/04/2017   CO2 24 03/04/2017     Lipid history: Has been treated with  Fenofibrate and Crestor by PCP     Lab Results  Component Value Date   CHOL 139 06/12/2016   HDL 49 (L) 06/12/2016   LDLCALC 59 06/12/2016   TRIG 154 (H) 06/12/2016   CHOLHDL 2.8 06/12/2016          She takes multiple psychotropic drugs   Physical Examination:  LMP 11/16/2010       ASSESSMENT:  Diabetes type 2, uncontrolled    See history of present illness for detailed discussion of current diabetes management, blood sugar patterns and problems identified  A1c  is 8.5 but this is better than usual  Her blood sugars have been Again quite variable and difficult to know what her patterns are since she is checking rarely irregularly Her mealtimes and carbohydrate intake is also quite variable from day to day Also now she is on a prednisone taper making it difficult to assess her insulin requirement Does appear to be needing relatively smaller amounts of basal insulin and more insulin to cover her midday and afternoon hyperglycemia with the U-500 insulin She has only started taking her insulin as prescribed the last couple of days and blood sugars appear to be better Also on Victoza   PLAN:   She will try to check blood sugars consistently 2-3 times a day either on waking up or after meals including at bedtime No change in insulin at the moment She will continue 65 units of Toujeo but consider reducing it further if she is waking up at low readings  after stopping prednisone She'll continue the same regimen for the Humalog for now but we need to make adjustments based on her blood sugar patterns on the next visit    There are no Patient Instructions on file for this visit.   Elayne Snare 03/10/2017, 9:23 PM   Note: This office note was prepared with Dragon voice recognition system technology. Any transcriptional errors that result from this process are unintentional.

## 2017-03-11 ENCOUNTER — Ambulatory Visit: Payer: Medicare HMO | Admitting: Endocrinology

## 2017-03-14 ENCOUNTER — Telehealth: Payer: Self-pay | Admitting: Pulmonary Disease

## 2017-03-14 DIAGNOSIS — J441 Chronic obstructive pulmonary disease with (acute) exacerbation: Secondary | ICD-10-CM | POA: Diagnosis not present

## 2017-03-14 DIAGNOSIS — E1151 Type 2 diabetes mellitus with diabetic peripheral angiopathy without gangrene: Secondary | ICD-10-CM | POA: Diagnosis not present

## 2017-03-14 DIAGNOSIS — E1140 Type 2 diabetes mellitus with diabetic neuropathy, unspecified: Secondary | ICD-10-CM | POA: Diagnosis not present

## 2017-03-14 DIAGNOSIS — E1122 Type 2 diabetes mellitus with diabetic chronic kidney disease: Secondary | ICD-10-CM | POA: Diagnosis not present

## 2017-03-14 DIAGNOSIS — E1165 Type 2 diabetes mellitus with hyperglycemia: Secondary | ICD-10-CM | POA: Diagnosis not present

## 2017-03-14 DIAGNOSIS — N184 Chronic kidney disease, stage 4 (severe): Secondary | ICD-10-CM | POA: Diagnosis not present

## 2017-03-14 DIAGNOSIS — I130 Hypertensive heart and chronic kidney disease with heart failure and stage 1 through stage 4 chronic kidney disease, or unspecified chronic kidney disease: Secondary | ICD-10-CM | POA: Diagnosis not present

## 2017-03-14 DIAGNOSIS — J9611 Chronic respiratory failure with hypoxia: Secondary | ICD-10-CM | POA: Diagnosis not present

## 2017-03-14 DIAGNOSIS — I509 Heart failure, unspecified: Secondary | ICD-10-CM | POA: Diagnosis not present

## 2017-03-14 NOTE — Telephone Encounter (Signed)
Called and spoke with patient she advised that her insurance is not covering the albuterol. We can do a PA or send in another alternative. VS please advise as to what you would like [place] to do, thanks.

## 2017-03-15 DIAGNOSIS — E1122 Type 2 diabetes mellitus with diabetic chronic kidney disease: Secondary | ICD-10-CM | POA: Diagnosis not present

## 2017-03-15 DIAGNOSIS — J441 Chronic obstructive pulmonary disease with (acute) exacerbation: Secondary | ICD-10-CM | POA: Diagnosis not present

## 2017-03-15 DIAGNOSIS — I130 Hypertensive heart and chronic kidney disease with heart failure and stage 1 through stage 4 chronic kidney disease, or unspecified chronic kidney disease: Secondary | ICD-10-CM | POA: Diagnosis not present

## 2017-03-15 DIAGNOSIS — E1140 Type 2 diabetes mellitus with diabetic neuropathy, unspecified: Secondary | ICD-10-CM | POA: Diagnosis not present

## 2017-03-15 DIAGNOSIS — J9611 Chronic respiratory failure with hypoxia: Secondary | ICD-10-CM | POA: Diagnosis not present

## 2017-03-15 DIAGNOSIS — N184 Chronic kidney disease, stage 4 (severe): Secondary | ICD-10-CM | POA: Diagnosis not present

## 2017-03-15 DIAGNOSIS — I509 Heart failure, unspecified: Secondary | ICD-10-CM | POA: Diagnosis not present

## 2017-03-15 DIAGNOSIS — E1151 Type 2 diabetes mellitus with diabetic peripheral angiopathy without gangrene: Secondary | ICD-10-CM | POA: Diagnosis not present

## 2017-03-15 DIAGNOSIS — E1165 Type 2 diabetes mellitus with hyperglycemia: Secondary | ICD-10-CM | POA: Diagnosis not present

## 2017-03-15 NOTE — Telephone Encounter (Signed)
Called and spoke with patients pharmacy, they stated that the albuterol went through fine and nothing was needed. Asked about other medications just to verify there wasn't a mistake and all medications are fine. Nothing further needed at this time.

## 2017-03-15 NOTE — Telephone Encounter (Signed)
They have to allow some brand of albuterol.  Can we check which brand of albuterol is allowed by her insurance (ventolin, proventil, proair).

## 2017-03-20 DIAGNOSIS — N184 Chronic kidney disease, stage 4 (severe): Secondary | ICD-10-CM | POA: Diagnosis not present

## 2017-03-20 DIAGNOSIS — J441 Chronic obstructive pulmonary disease with (acute) exacerbation: Secondary | ICD-10-CM | POA: Diagnosis not present

## 2017-03-20 DIAGNOSIS — E1165 Type 2 diabetes mellitus with hyperglycemia: Secondary | ICD-10-CM | POA: Diagnosis not present

## 2017-03-20 DIAGNOSIS — E1122 Type 2 diabetes mellitus with diabetic chronic kidney disease: Secondary | ICD-10-CM | POA: Diagnosis not present

## 2017-03-20 DIAGNOSIS — J9611 Chronic respiratory failure with hypoxia: Secondary | ICD-10-CM | POA: Diagnosis not present

## 2017-03-20 DIAGNOSIS — K219 Gastro-esophageal reflux disease without esophagitis: Secondary | ICD-10-CM | POA: Diagnosis not present

## 2017-03-20 DIAGNOSIS — I509 Heart failure, unspecified: Secondary | ICD-10-CM | POA: Diagnosis not present

## 2017-03-20 DIAGNOSIS — M549 Dorsalgia, unspecified: Secondary | ICD-10-CM | POA: Diagnosis not present

## 2017-03-20 DIAGNOSIS — E7800 Pure hypercholesterolemia, unspecified: Secondary | ICD-10-CM | POA: Diagnosis not present

## 2017-03-20 DIAGNOSIS — E1140 Type 2 diabetes mellitus with diabetic neuropathy, unspecified: Secondary | ICD-10-CM | POA: Diagnosis not present

## 2017-03-20 DIAGNOSIS — J449 Chronic obstructive pulmonary disease, unspecified: Secondary | ICD-10-CM | POA: Diagnosis not present

## 2017-03-20 DIAGNOSIS — E1169 Type 2 diabetes mellitus with other specified complication: Secondary | ICD-10-CM | POA: Diagnosis not present

## 2017-03-20 DIAGNOSIS — I130 Hypertensive heart and chronic kidney disease with heart failure and stage 1 through stage 4 chronic kidney disease, or unspecified chronic kidney disease: Secondary | ICD-10-CM | POA: Diagnosis not present

## 2017-03-20 DIAGNOSIS — Z09 Encounter for follow-up examination after completed treatment for conditions other than malignant neoplasm: Secondary | ICD-10-CM | POA: Diagnosis not present

## 2017-03-20 DIAGNOSIS — N189 Chronic kidney disease, unspecified: Secondary | ICD-10-CM | POA: Diagnosis not present

## 2017-03-20 DIAGNOSIS — E1151 Type 2 diabetes mellitus with diabetic peripheral angiopathy without gangrene: Secondary | ICD-10-CM | POA: Diagnosis not present

## 2017-03-20 DIAGNOSIS — Z21 Asymptomatic human immunodeficiency virus [HIV] infection status: Secondary | ICD-10-CM | POA: Diagnosis not present

## 2017-03-21 DIAGNOSIS — S72002D Fracture of unspecified part of neck of left femur, subsequent encounter for closed fracture with routine healing: Secondary | ICD-10-CM | POA: Diagnosis not present

## 2017-03-21 DIAGNOSIS — M6281 Muscle weakness (generalized): Secondary | ICD-10-CM | POA: Diagnosis not present

## 2017-03-21 DIAGNOSIS — J45901 Unspecified asthma with (acute) exacerbation: Secondary | ICD-10-CM | POA: Diagnosis not present

## 2017-03-21 DIAGNOSIS — J449 Chronic obstructive pulmonary disease, unspecified: Secondary | ICD-10-CM | POA: Diagnosis not present

## 2017-03-21 DIAGNOSIS — N184 Chronic kidney disease, stage 4 (severe): Secondary | ICD-10-CM | POA: Diagnosis not present

## 2017-03-21 DIAGNOSIS — J45909 Unspecified asthma, uncomplicated: Secondary | ICD-10-CM | POA: Diagnosis not present

## 2017-03-21 DIAGNOSIS — M545 Low back pain: Secondary | ICD-10-CM | POA: Diagnosis not present

## 2017-03-26 DIAGNOSIS — I130 Hypertensive heart and chronic kidney disease with heart failure and stage 1 through stage 4 chronic kidney disease, or unspecified chronic kidney disease: Secondary | ICD-10-CM | POA: Diagnosis not present

## 2017-03-26 DIAGNOSIS — J441 Chronic obstructive pulmonary disease with (acute) exacerbation: Secondary | ICD-10-CM | POA: Diagnosis not present

## 2017-03-26 DIAGNOSIS — E1122 Type 2 diabetes mellitus with diabetic chronic kidney disease: Secondary | ICD-10-CM | POA: Diagnosis not present

## 2017-03-26 DIAGNOSIS — E1165 Type 2 diabetes mellitus with hyperglycemia: Secondary | ICD-10-CM | POA: Diagnosis not present

## 2017-03-26 DIAGNOSIS — J9611 Chronic respiratory failure with hypoxia: Secondary | ICD-10-CM | POA: Diagnosis not present

## 2017-03-26 DIAGNOSIS — I509 Heart failure, unspecified: Secondary | ICD-10-CM | POA: Diagnosis not present

## 2017-03-26 DIAGNOSIS — N184 Chronic kidney disease, stage 4 (severe): Secondary | ICD-10-CM | POA: Diagnosis not present

## 2017-03-26 DIAGNOSIS — E1140 Type 2 diabetes mellitus with diabetic neuropathy, unspecified: Secondary | ICD-10-CM | POA: Diagnosis not present

## 2017-03-26 DIAGNOSIS — E1151 Type 2 diabetes mellitus with diabetic peripheral angiopathy without gangrene: Secondary | ICD-10-CM | POA: Diagnosis not present

## 2017-03-28 ENCOUNTER — Other Ambulatory Visit: Payer: Self-pay | Admitting: Endocrinology

## 2017-03-28 DIAGNOSIS — M5136 Other intervertebral disc degeneration, lumbar region: Secondary | ICD-10-CM | POA: Diagnosis not present

## 2017-03-28 DIAGNOSIS — Z79899 Other long term (current) drug therapy: Secondary | ICD-10-CM | POA: Diagnosis not present

## 2017-03-28 DIAGNOSIS — G8929 Other chronic pain: Secondary | ICD-10-CM | POA: Diagnosis not present

## 2017-03-28 DIAGNOSIS — M545 Low back pain: Secondary | ICD-10-CM | POA: Diagnosis not present

## 2017-04-02 DIAGNOSIS — E1122 Type 2 diabetes mellitus with diabetic chronic kidney disease: Secondary | ICD-10-CM | POA: Diagnosis not present

## 2017-04-02 DIAGNOSIS — I509 Heart failure, unspecified: Secondary | ICD-10-CM | POA: Diagnosis not present

## 2017-04-02 DIAGNOSIS — E1151 Type 2 diabetes mellitus with diabetic peripheral angiopathy without gangrene: Secondary | ICD-10-CM | POA: Diagnosis not present

## 2017-04-02 DIAGNOSIS — E1165 Type 2 diabetes mellitus with hyperglycemia: Secondary | ICD-10-CM | POA: Diagnosis not present

## 2017-04-02 DIAGNOSIS — N184 Chronic kidney disease, stage 4 (severe): Secondary | ICD-10-CM | POA: Diagnosis not present

## 2017-04-02 DIAGNOSIS — J441 Chronic obstructive pulmonary disease with (acute) exacerbation: Secondary | ICD-10-CM | POA: Diagnosis not present

## 2017-04-02 DIAGNOSIS — E1140 Type 2 diabetes mellitus with diabetic neuropathy, unspecified: Secondary | ICD-10-CM | POA: Diagnosis not present

## 2017-04-02 DIAGNOSIS — I130 Hypertensive heart and chronic kidney disease with heart failure and stage 1 through stage 4 chronic kidney disease, or unspecified chronic kidney disease: Secondary | ICD-10-CM | POA: Diagnosis not present

## 2017-04-02 DIAGNOSIS — J9611 Chronic respiratory failure with hypoxia: Secondary | ICD-10-CM | POA: Diagnosis not present

## 2017-04-08 DIAGNOSIS — E1151 Type 2 diabetes mellitus with diabetic peripheral angiopathy without gangrene: Secondary | ICD-10-CM | POA: Diagnosis not present

## 2017-04-08 DIAGNOSIS — E1165 Type 2 diabetes mellitus with hyperglycemia: Secondary | ICD-10-CM | POA: Diagnosis not present

## 2017-04-08 DIAGNOSIS — E1122 Type 2 diabetes mellitus with diabetic chronic kidney disease: Secondary | ICD-10-CM | POA: Diagnosis not present

## 2017-04-08 DIAGNOSIS — E1140 Type 2 diabetes mellitus with diabetic neuropathy, unspecified: Secondary | ICD-10-CM | POA: Diagnosis not present

## 2017-04-08 DIAGNOSIS — N184 Chronic kidney disease, stage 4 (severe): Secondary | ICD-10-CM | POA: Diagnosis not present

## 2017-04-08 DIAGNOSIS — I509 Heart failure, unspecified: Secondary | ICD-10-CM | POA: Diagnosis not present

## 2017-04-08 DIAGNOSIS — J441 Chronic obstructive pulmonary disease with (acute) exacerbation: Secondary | ICD-10-CM | POA: Diagnosis not present

## 2017-04-08 DIAGNOSIS — J9611 Chronic respiratory failure with hypoxia: Secondary | ICD-10-CM | POA: Diagnosis not present

## 2017-04-08 DIAGNOSIS — I130 Hypertensive heart and chronic kidney disease with heart failure and stage 1 through stage 4 chronic kidney disease, or unspecified chronic kidney disease: Secondary | ICD-10-CM | POA: Diagnosis not present

## 2017-04-16 DIAGNOSIS — I130 Hypertensive heart and chronic kidney disease with heart failure and stage 1 through stage 4 chronic kidney disease, or unspecified chronic kidney disease: Secondary | ICD-10-CM | POA: Diagnosis not present

## 2017-04-16 DIAGNOSIS — E1165 Type 2 diabetes mellitus with hyperglycemia: Secondary | ICD-10-CM | POA: Diagnosis not present

## 2017-04-16 DIAGNOSIS — J9611 Chronic respiratory failure with hypoxia: Secondary | ICD-10-CM | POA: Diagnosis not present

## 2017-04-16 DIAGNOSIS — N184 Chronic kidney disease, stage 4 (severe): Secondary | ICD-10-CM | POA: Diagnosis not present

## 2017-04-16 DIAGNOSIS — E1151 Type 2 diabetes mellitus with diabetic peripheral angiopathy without gangrene: Secondary | ICD-10-CM | POA: Diagnosis not present

## 2017-04-16 DIAGNOSIS — E1122 Type 2 diabetes mellitus with diabetic chronic kidney disease: Secondary | ICD-10-CM | POA: Diagnosis not present

## 2017-04-16 DIAGNOSIS — I509 Heart failure, unspecified: Secondary | ICD-10-CM | POA: Diagnosis not present

## 2017-04-16 DIAGNOSIS — E1140 Type 2 diabetes mellitus with diabetic neuropathy, unspecified: Secondary | ICD-10-CM | POA: Diagnosis not present

## 2017-04-16 DIAGNOSIS — J441 Chronic obstructive pulmonary disease with (acute) exacerbation: Secondary | ICD-10-CM | POA: Diagnosis not present

## 2017-04-17 DIAGNOSIS — E1165 Type 2 diabetes mellitus with hyperglycemia: Secondary | ICD-10-CM | POA: Diagnosis not present

## 2017-04-17 DIAGNOSIS — E1122 Type 2 diabetes mellitus with diabetic chronic kidney disease: Secondary | ICD-10-CM | POA: Diagnosis not present

## 2017-04-17 DIAGNOSIS — J441 Chronic obstructive pulmonary disease with (acute) exacerbation: Secondary | ICD-10-CM | POA: Diagnosis not present

## 2017-04-17 DIAGNOSIS — I509 Heart failure, unspecified: Secondary | ICD-10-CM | POA: Diagnosis not present

## 2017-04-17 DIAGNOSIS — N184 Chronic kidney disease, stage 4 (severe): Secondary | ICD-10-CM | POA: Diagnosis not present

## 2017-04-17 DIAGNOSIS — E1151 Type 2 diabetes mellitus with diabetic peripheral angiopathy without gangrene: Secondary | ICD-10-CM | POA: Diagnosis not present

## 2017-04-17 DIAGNOSIS — J9611 Chronic respiratory failure with hypoxia: Secondary | ICD-10-CM | POA: Diagnosis not present

## 2017-04-17 DIAGNOSIS — E1140 Type 2 diabetes mellitus with diabetic neuropathy, unspecified: Secondary | ICD-10-CM | POA: Diagnosis not present

## 2017-04-17 DIAGNOSIS — I130 Hypertensive heart and chronic kidney disease with heart failure and stage 1 through stage 4 chronic kidney disease, or unspecified chronic kidney disease: Secondary | ICD-10-CM | POA: Diagnosis not present

## 2017-04-21 DIAGNOSIS — M6281 Muscle weakness (generalized): Secondary | ICD-10-CM | POA: Diagnosis not present

## 2017-04-21 DIAGNOSIS — J45909 Unspecified asthma, uncomplicated: Secondary | ICD-10-CM | POA: Diagnosis not present

## 2017-04-21 DIAGNOSIS — S72002D Fracture of unspecified part of neck of left femur, subsequent encounter for closed fracture with routine healing: Secondary | ICD-10-CM | POA: Diagnosis not present

## 2017-04-21 DIAGNOSIS — N184 Chronic kidney disease, stage 4 (severe): Secondary | ICD-10-CM | POA: Diagnosis not present

## 2017-04-21 DIAGNOSIS — J449 Chronic obstructive pulmonary disease, unspecified: Secondary | ICD-10-CM | POA: Diagnosis not present

## 2017-04-21 DIAGNOSIS — M545 Low back pain: Secondary | ICD-10-CM | POA: Diagnosis not present

## 2017-04-21 DIAGNOSIS — J45901 Unspecified asthma with (acute) exacerbation: Secondary | ICD-10-CM | POA: Diagnosis not present

## 2017-04-23 DIAGNOSIS — E1140 Type 2 diabetes mellitus with diabetic neuropathy, unspecified: Secondary | ICD-10-CM | POA: Diagnosis not present

## 2017-04-23 DIAGNOSIS — E1165 Type 2 diabetes mellitus with hyperglycemia: Secondary | ICD-10-CM | POA: Diagnosis not present

## 2017-04-23 DIAGNOSIS — E1151 Type 2 diabetes mellitus with diabetic peripheral angiopathy without gangrene: Secondary | ICD-10-CM | POA: Diagnosis not present

## 2017-04-23 DIAGNOSIS — I509 Heart failure, unspecified: Secondary | ICD-10-CM | POA: Diagnosis not present

## 2017-04-23 DIAGNOSIS — E1122 Type 2 diabetes mellitus with diabetic chronic kidney disease: Secondary | ICD-10-CM | POA: Diagnosis not present

## 2017-04-23 DIAGNOSIS — J9611 Chronic respiratory failure with hypoxia: Secondary | ICD-10-CM | POA: Diagnosis not present

## 2017-04-23 DIAGNOSIS — I130 Hypertensive heart and chronic kidney disease with heart failure and stage 1 through stage 4 chronic kidney disease, or unspecified chronic kidney disease: Secondary | ICD-10-CM | POA: Diagnosis not present

## 2017-04-23 DIAGNOSIS — N184 Chronic kidney disease, stage 4 (severe): Secondary | ICD-10-CM | POA: Diagnosis not present

## 2017-04-23 DIAGNOSIS — J441 Chronic obstructive pulmonary disease with (acute) exacerbation: Secondary | ICD-10-CM | POA: Diagnosis not present

## 2017-04-24 ENCOUNTER — Ambulatory Visit: Payer: Self-pay | Admitting: Endocrinology

## 2017-04-24 DIAGNOSIS — Z79899 Other long term (current) drug therapy: Secondary | ICD-10-CM | POA: Diagnosis not present

## 2017-04-24 DIAGNOSIS — Z5181 Encounter for therapeutic drug level monitoring: Secondary | ICD-10-CM | POA: Diagnosis not present

## 2017-04-25 DIAGNOSIS — Z89612 Acquired absence of left leg above knee: Secondary | ICD-10-CM | POA: Diagnosis not present

## 2017-04-25 DIAGNOSIS — Z79899 Other long term (current) drug therapy: Secondary | ICD-10-CM | POA: Diagnosis not present

## 2017-04-25 DIAGNOSIS — G546 Phantom limb syndrome with pain: Secondary | ICD-10-CM | POA: Diagnosis not present

## 2017-04-25 DIAGNOSIS — M5136 Other intervertebral disc degeneration, lumbar region: Secondary | ICD-10-CM | POA: Diagnosis not present

## 2017-04-26 DIAGNOSIS — E1165 Type 2 diabetes mellitus with hyperglycemia: Secondary | ICD-10-CM | POA: Diagnosis not present

## 2017-04-26 DIAGNOSIS — I130 Hypertensive heart and chronic kidney disease with heart failure and stage 1 through stage 4 chronic kidney disease, or unspecified chronic kidney disease: Secondary | ICD-10-CM | POA: Diagnosis not present

## 2017-04-26 DIAGNOSIS — E1122 Type 2 diabetes mellitus with diabetic chronic kidney disease: Secondary | ICD-10-CM | POA: Diagnosis not present

## 2017-04-26 DIAGNOSIS — N184 Chronic kidney disease, stage 4 (severe): Secondary | ICD-10-CM | POA: Diagnosis not present

## 2017-04-26 DIAGNOSIS — I509 Heart failure, unspecified: Secondary | ICD-10-CM | POA: Diagnosis not present

## 2017-04-26 DIAGNOSIS — E1140 Type 2 diabetes mellitus with diabetic neuropathy, unspecified: Secondary | ICD-10-CM | POA: Diagnosis not present

## 2017-04-26 DIAGNOSIS — J441 Chronic obstructive pulmonary disease with (acute) exacerbation: Secondary | ICD-10-CM | POA: Diagnosis not present

## 2017-04-26 DIAGNOSIS — J9611 Chronic respiratory failure with hypoxia: Secondary | ICD-10-CM | POA: Diagnosis not present

## 2017-04-26 DIAGNOSIS — E1151 Type 2 diabetes mellitus with diabetic peripheral angiopathy without gangrene: Secondary | ICD-10-CM | POA: Diagnosis not present

## 2017-05-01 DIAGNOSIS — E1151 Type 2 diabetes mellitus with diabetic peripheral angiopathy without gangrene: Secondary | ICD-10-CM | POA: Diagnosis not present

## 2017-05-01 DIAGNOSIS — J441 Chronic obstructive pulmonary disease with (acute) exacerbation: Secondary | ICD-10-CM | POA: Diagnosis not present

## 2017-05-01 DIAGNOSIS — J9611 Chronic respiratory failure with hypoxia: Secondary | ICD-10-CM | POA: Diagnosis not present

## 2017-05-01 DIAGNOSIS — E1140 Type 2 diabetes mellitus with diabetic neuropathy, unspecified: Secondary | ICD-10-CM | POA: Diagnosis not present

## 2017-05-01 DIAGNOSIS — I509 Heart failure, unspecified: Secondary | ICD-10-CM | POA: Diagnosis not present

## 2017-05-01 DIAGNOSIS — I130 Hypertensive heart and chronic kidney disease with heart failure and stage 1 through stage 4 chronic kidney disease, or unspecified chronic kidney disease: Secondary | ICD-10-CM | POA: Diagnosis not present

## 2017-05-01 DIAGNOSIS — E1122 Type 2 diabetes mellitus with diabetic chronic kidney disease: Secondary | ICD-10-CM | POA: Diagnosis not present

## 2017-05-01 DIAGNOSIS — E1165 Type 2 diabetes mellitus with hyperglycemia: Secondary | ICD-10-CM | POA: Diagnosis not present

## 2017-05-01 DIAGNOSIS — N184 Chronic kidney disease, stage 4 (severe): Secondary | ICD-10-CM | POA: Diagnosis not present

## 2017-05-02 DIAGNOSIS — J9611 Chronic respiratory failure with hypoxia: Secondary | ICD-10-CM | POA: Diagnosis not present

## 2017-05-02 DIAGNOSIS — N184 Chronic kidney disease, stage 4 (severe): Secondary | ICD-10-CM | POA: Diagnosis not present

## 2017-05-02 DIAGNOSIS — J441 Chronic obstructive pulmonary disease with (acute) exacerbation: Secondary | ICD-10-CM | POA: Diagnosis not present

## 2017-05-02 DIAGNOSIS — I130 Hypertensive heart and chronic kidney disease with heart failure and stage 1 through stage 4 chronic kidney disease, or unspecified chronic kidney disease: Secondary | ICD-10-CM | POA: Diagnosis not present

## 2017-05-02 DIAGNOSIS — E1122 Type 2 diabetes mellitus with diabetic chronic kidney disease: Secondary | ICD-10-CM | POA: Diagnosis not present

## 2017-05-02 DIAGNOSIS — I509 Heart failure, unspecified: Secondary | ICD-10-CM | POA: Diagnosis not present

## 2017-05-02 DIAGNOSIS — E1140 Type 2 diabetes mellitus with diabetic neuropathy, unspecified: Secondary | ICD-10-CM | POA: Diagnosis not present

## 2017-05-02 DIAGNOSIS — E1151 Type 2 diabetes mellitus with diabetic peripheral angiopathy without gangrene: Secondary | ICD-10-CM | POA: Diagnosis not present

## 2017-05-02 DIAGNOSIS — E1165 Type 2 diabetes mellitus with hyperglycemia: Secondary | ICD-10-CM | POA: Diagnosis not present

## 2017-05-03 DIAGNOSIS — J9611 Chronic respiratory failure with hypoxia: Secondary | ICD-10-CM | POA: Diagnosis not present

## 2017-05-03 DIAGNOSIS — I130 Hypertensive heart and chronic kidney disease with heart failure and stage 1 through stage 4 chronic kidney disease, or unspecified chronic kidney disease: Secondary | ICD-10-CM | POA: Diagnosis not present

## 2017-05-03 DIAGNOSIS — I509 Heart failure, unspecified: Secondary | ICD-10-CM | POA: Diagnosis not present

## 2017-05-03 DIAGNOSIS — E1151 Type 2 diabetes mellitus with diabetic peripheral angiopathy without gangrene: Secondary | ICD-10-CM | POA: Diagnosis not present

## 2017-05-03 DIAGNOSIS — E1165 Type 2 diabetes mellitus with hyperglycemia: Secondary | ICD-10-CM | POA: Diagnosis not present

## 2017-05-03 DIAGNOSIS — J441 Chronic obstructive pulmonary disease with (acute) exacerbation: Secondary | ICD-10-CM | POA: Diagnosis not present

## 2017-05-03 DIAGNOSIS — N184 Chronic kidney disease, stage 4 (severe): Secondary | ICD-10-CM | POA: Diagnosis not present

## 2017-05-03 DIAGNOSIS — E1122 Type 2 diabetes mellitus with diabetic chronic kidney disease: Secondary | ICD-10-CM | POA: Diagnosis not present

## 2017-05-03 DIAGNOSIS — E1140 Type 2 diabetes mellitus with diabetic neuropathy, unspecified: Secondary | ICD-10-CM | POA: Diagnosis not present

## 2017-05-04 DIAGNOSIS — E1140 Type 2 diabetes mellitus with diabetic neuropathy, unspecified: Secondary | ICD-10-CM | POA: Diagnosis not present

## 2017-05-04 DIAGNOSIS — I130 Hypertensive heart and chronic kidney disease with heart failure and stage 1 through stage 4 chronic kidney disease, or unspecified chronic kidney disease: Secondary | ICD-10-CM | POA: Diagnosis not present

## 2017-05-04 DIAGNOSIS — J9611 Chronic respiratory failure with hypoxia: Secondary | ICD-10-CM | POA: Diagnosis not present

## 2017-05-04 DIAGNOSIS — J441 Chronic obstructive pulmonary disease with (acute) exacerbation: Secondary | ICD-10-CM | POA: Diagnosis not present

## 2017-05-04 DIAGNOSIS — E1165 Type 2 diabetes mellitus with hyperglycemia: Secondary | ICD-10-CM | POA: Diagnosis not present

## 2017-05-04 DIAGNOSIS — E1151 Type 2 diabetes mellitus with diabetic peripheral angiopathy without gangrene: Secondary | ICD-10-CM | POA: Diagnosis not present

## 2017-05-04 DIAGNOSIS — I509 Heart failure, unspecified: Secondary | ICD-10-CM | POA: Diagnosis not present

## 2017-05-04 DIAGNOSIS — N184 Chronic kidney disease, stage 4 (severe): Secondary | ICD-10-CM | POA: Diagnosis not present

## 2017-05-04 DIAGNOSIS — E1122 Type 2 diabetes mellitus with diabetic chronic kidney disease: Secondary | ICD-10-CM | POA: Diagnosis not present

## 2017-05-08 DIAGNOSIS — E1122 Type 2 diabetes mellitus with diabetic chronic kidney disease: Secondary | ICD-10-CM | POA: Diagnosis not present

## 2017-05-08 DIAGNOSIS — E1151 Type 2 diabetes mellitus with diabetic peripheral angiopathy without gangrene: Secondary | ICD-10-CM | POA: Diagnosis not present

## 2017-05-08 DIAGNOSIS — N184 Chronic kidney disease, stage 4 (severe): Secondary | ICD-10-CM | POA: Diagnosis not present

## 2017-05-08 DIAGNOSIS — G894 Chronic pain syndrome: Secondary | ICD-10-CM | POA: Diagnosis not present

## 2017-05-08 DIAGNOSIS — I509 Heart failure, unspecified: Secondary | ICD-10-CM | POA: Diagnosis not present

## 2017-05-08 DIAGNOSIS — I130 Hypertensive heart and chronic kidney disease with heart failure and stage 1 through stage 4 chronic kidney disease, or unspecified chronic kidney disease: Secondary | ICD-10-CM | POA: Diagnosis not present

## 2017-05-08 DIAGNOSIS — J449 Chronic obstructive pulmonary disease, unspecified: Secondary | ICD-10-CM | POA: Diagnosis not present

## 2017-05-08 DIAGNOSIS — J9611 Chronic respiratory failure with hypoxia: Secondary | ICD-10-CM | POA: Diagnosis not present

## 2017-05-08 DIAGNOSIS — M545 Low back pain: Secondary | ICD-10-CM | POA: Diagnosis not present

## 2017-05-10 DIAGNOSIS — I509 Heart failure, unspecified: Secondary | ICD-10-CM | POA: Diagnosis not present

## 2017-05-10 DIAGNOSIS — I130 Hypertensive heart and chronic kidney disease with heart failure and stage 1 through stage 4 chronic kidney disease, or unspecified chronic kidney disease: Secondary | ICD-10-CM | POA: Diagnosis not present

## 2017-05-10 DIAGNOSIS — E1122 Type 2 diabetes mellitus with diabetic chronic kidney disease: Secondary | ICD-10-CM | POA: Diagnosis not present

## 2017-05-10 DIAGNOSIS — J449 Chronic obstructive pulmonary disease, unspecified: Secondary | ICD-10-CM | POA: Diagnosis not present

## 2017-05-10 DIAGNOSIS — J9611 Chronic respiratory failure with hypoxia: Secondary | ICD-10-CM | POA: Diagnosis not present

## 2017-05-10 DIAGNOSIS — G894 Chronic pain syndrome: Secondary | ICD-10-CM | POA: Diagnosis not present

## 2017-05-10 DIAGNOSIS — M545 Low back pain: Secondary | ICD-10-CM | POA: Diagnosis not present

## 2017-05-10 DIAGNOSIS — N184 Chronic kidney disease, stage 4 (severe): Secondary | ICD-10-CM | POA: Diagnosis not present

## 2017-05-10 DIAGNOSIS — E1151 Type 2 diabetes mellitus with diabetic peripheral angiopathy without gangrene: Secondary | ICD-10-CM | POA: Diagnosis not present

## 2017-05-17 ENCOUNTER — Telehealth: Payer: Self-pay | Admitting: Pulmonary Disease

## 2017-05-17 NOTE — Telephone Encounter (Signed)
PA initiated today on [email or web address] - PA Case ID: [id number]

## 2017-05-20 NOTE — Telephone Encounter (Signed)
Checked status of PA via [email or web address], additional info was needed.  Several questions answered and sent to plan.  PA is still in review.  Will route back to [facility] for follow-up.

## 2017-05-21 ENCOUNTER — Ambulatory Visit (INDEPENDENT_AMBULATORY_CARE_PROVIDER_SITE_OTHER): Payer: Medicare HMO | Admitting: Infectious Diseases

## 2017-05-21 ENCOUNTER — Encounter: Payer: Self-pay | Admitting: Infectious Diseases

## 2017-05-21 VITALS — BP 122/80 | HR 94 | Temp 98.7°F

## 2017-05-21 DIAGNOSIS — Z79899 Other long term (current) drug therapy: Secondary | ICD-10-CM | POA: Diagnosis not present

## 2017-05-21 DIAGNOSIS — Z113 Encounter for screening for infections with a predominantly sexual mode of transmission: Secondary | ICD-10-CM | POA: Diagnosis not present

## 2017-05-21 DIAGNOSIS — E1165 Type 2 diabetes mellitus with hyperglycemia: Secondary | ICD-10-CM | POA: Diagnosis not present

## 2017-05-21 DIAGNOSIS — N184 Chronic kidney disease, stage 4 (severe): Secondary | ICD-10-CM | POA: Diagnosis not present

## 2017-05-21 DIAGNOSIS — S72002D Fracture of unspecified part of neck of left femur, subsequent encounter for closed fracture with routine healing: Secondary | ICD-10-CM | POA: Diagnosis not present

## 2017-05-21 DIAGNOSIS — Z23 Encounter for immunization: Secondary | ICD-10-CM

## 2017-05-21 DIAGNOSIS — B20 Human immunodeficiency virus [HIV] disease: Secondary | ICD-10-CM

## 2017-05-21 DIAGNOSIS — E118 Type 2 diabetes mellitus with unspecified complications: Secondary | ICD-10-CM | POA: Diagnosis not present

## 2017-05-21 DIAGNOSIS — Z720 Tobacco use: Secondary | ICD-10-CM

## 2017-05-21 DIAGNOSIS — IMO0002 Reserved for concepts with insufficient information to code with codable children: Secondary | ICD-10-CM

## 2017-05-21 DIAGNOSIS — J441 Chronic obstructive pulmonary disease with (acute) exacerbation: Secondary | ICD-10-CM

## 2017-05-21 DIAGNOSIS — J45901 Unspecified asthma with (acute) exacerbation: Secondary | ICD-10-CM | POA: Diagnosis not present

## 2017-05-21 DIAGNOSIS — J45909 Unspecified asthma, uncomplicated: Secondary | ICD-10-CM | POA: Diagnosis not present

## 2017-05-21 DIAGNOSIS — M6281 Muscle weakness (generalized): Secondary | ICD-10-CM | POA: Diagnosis not present

## 2017-05-21 DIAGNOSIS — J449 Chronic obstructive pulmonary disease, unspecified: Secondary | ICD-10-CM | POA: Diagnosis not present

## 2017-05-21 DIAGNOSIS — M545 Low back pain: Secondary | ICD-10-CM | POA: Diagnosis not present

## 2017-05-21 NOTE — Assessment & Plan Note (Signed)
Appears stable currently.

## 2017-05-21 NOTE — Assessment & Plan Note (Signed)
Swears she is going to quit.

## 2017-05-21 NOTE — Assessment & Plan Note (Signed)
Appears to be better controlled Appreciate her PCP f/u.

## 2017-05-21 NOTE — Addendum Note (Signed)
Addended by: Dolan Amen D on: 05/21/2017 04:29 PM   Modules accepted: Orders

## 2017-05-21 NOTE — Addendum Note (Signed)
Addended by: Pola Corn on: 05/21/2017 03:55 PM   Modules accepted: Orders

## 2017-05-21 NOTE — Progress Notes (Signed)
   Subjective:    Patient ID: [name], female    DOB: 06/02/1965, 52 y.o.   MRN: [id number]  HPI  52yo F with HIV+ since 2009, DM2 with retinopathy + neuropathy + nephropathy, hyperlipidemia, obesity. L BKA 2016.  Previously taking atripla then switched to odefsy -->genvoya due to worsening kidney fxn COPD. Has had chronic back pain as well.  Had MRI (05-22-13) done which showed L1-2 and L4-5 disc compression, stenosis, nerve inpingement (L5). Was eval by neurosurgery and deferred.    In last 6 months has been in hospital for COPD, panic attack, hyperglycemia (DKA after steroids).  Now going to [place], was in [place]. Her mother died, she is working on clarifying her estate.  "Got over a big bout of depression with that". Now feels "tired".   Review of Systems  Constitutional: Negative for appetite change and unexpected weight change.  Eyes: Positive for visual disturbance.  Respiratory: Negative for cough and shortness of breath.   Gastrointestinal: Negative for constipation and diarrhea.  Genitourinary: Negative for difficulty urinating.  Please see HPI. All other systems reviewed and negative.  Has home O2 prn. 94-96% at home. FSG have been better- highest 168.  Had mammo, pap last year. [facility] will [facility].  Has eye dr, will make appt.     Objective:   Physical Exam  Constitutional: She is oriented to person, place, and time. She appears well-developed and well-nourished. No distress.  HENT:  Mouth/Throat: No oropharyngeal exudate.  Eyes: Pupils are equal, round, and reactive to light. EOM are normal.  Neck: Normal range of motion. Neck supple.  Cardiovascular: Normal rate, regular rhythm and normal heart sounds.  Pulmonary/Chest: Effort normal. No respiratory distress. She has rhonchi.  Abdominal: She exhibits no distension. There is no tenderness.  Lymphadenopathy:    She has no cervical adenopathy.  Neurological: She is alert and oriented to person,  place, and time.  Psychiatric: She has a normal mood and affect.      Assessment & Plan:

## 2017-05-21 NOTE — Assessment & Plan Note (Signed)
Will check her labs today prenar today She will schedule her health maintenance.  Will see her back in 6 months.

## 2017-05-22 LAB — COMPREHENSIVE METABOLIC PANEL
AG RATIO: 1.7 (calc) (ref 1.0–2.5)
ALKALINE PHOSPHATASE (APISO): 66 U/L (ref 33–130)
ALT: 14 U/L (ref 6–29)
AST: 13 U/L (ref 10–35)
Albumin: 4.1 g/dL (ref 3.6–5.1)
BILIRUBIN TOTAL: 0.2 mg/dL (ref 0.2–1.2)
BUN/Creatinine Ratio: 23 (calc) — ABNORMAL HIGH (ref 6–22)
BUN: 28 mg/dL — ABNORMAL HIGH (ref 7–25)
CALCIUM: 9.3 mg/dL (ref 8.6–10.4)
CHLORIDE: 101 mmol/L (ref 98–110)
CO2: 32 mmol/L (ref 20–32)
Creat: 1.23 mg/dL — ABNORMAL HIGH (ref 0.50–1.05)
GLOBULIN: 2.4 g/dL (ref 1.9–3.7)
Glucose, Bld: 221 mg/dL — ABNORMAL HIGH (ref 65–99)
Potassium: 4.8 mmol/L (ref 3.5–5.3)
Sodium: 139 mmol/L (ref 135–146)
Total Protein: 6.5 g/dL (ref 6.1–8.1)

## 2017-05-22 LAB — RPR: RPR: NONREACTIVE

## 2017-05-22 LAB — LIPID PANEL
Cholesterol: 250 mg/dL — ABNORMAL HIGH (ref <200)
HDL: 73 mg/dL (ref >50)
LDL CHOLESTEROL (CALC): 147 mg/dL — AB
NON-HDL CHOLESTEROL (CALC): 177 mg/dL — AB (ref <130)
TRIGLYCERIDES: 163 mg/dL — AB (ref <150)
Total CHOL/HDL Ratio: 3.4 (calc) (ref <5.0)

## 2017-05-22 LAB — CBC
HCT: 42.7 % (ref 35.0–45.0)
Hemoglobin: 13.8 g/dL (ref 11.7–15.5)
MCH: 28.9 pg (ref 27.0–33.0)
MCHC: 32.3 g/dL (ref 32.0–36.0)
MCV: 89.5 fL (ref 80.0–100.0)
MPV: 12 fL (ref 7.5–12.5)
Platelets: 262 10*3/uL (ref 140–400)
RBC: 4.77 10*6/uL (ref 3.80–5.10)
RDW: 16.2 % — AB (ref 11.0–15.0)
WBC: 14.2 10*3/uL — AB (ref 3.8–10.8)

## 2017-05-23 ENCOUNTER — Other Ambulatory Visit: Payer: Self-pay | Admitting: Behavioral Health

## 2017-05-23 ENCOUNTER — Other Ambulatory Visit: Payer: Self-pay | Admitting: Infectious Diseases

## 2017-05-23 DIAGNOSIS — B20 Human immunodeficiency virus [HIV] disease: Secondary | ICD-10-CM

## 2017-05-23 DIAGNOSIS — G546 Phantom limb syndrome with pain: Secondary | ICD-10-CM | POA: Diagnosis not present

## 2017-05-23 DIAGNOSIS — M5136 Other intervertebral disc degeneration, lumbar region: Secondary | ICD-10-CM | POA: Diagnosis not present

## 2017-05-23 DIAGNOSIS — Z79899 Other long term (current) drug therapy: Secondary | ICD-10-CM | POA: Diagnosis not present

## 2017-05-23 LAB — T-HELPER CELL (CD4) - (RCID CLINIC ONLY)
CD4 T CELL HELPER: 27 % — AB (ref 33–55)
CD4 T Cell Abs: 770 /uL (ref 400–2700)

## 2017-05-23 MED ORDER — ELVITEG-COBIC-EMTRICIT-TENOFAF 150-150-200-10 MG PO TABS: 1.0000 | ORAL_TABLET | Freq: Every day | ORAL | 5 refills | Status: DC

## 2017-05-24 LAB — HIV-1 RNA QUANT-NO REFLEX-BLD
HIV 1 RNA QUANT: DETECTED {copies}/mL — AB
HIV-1 RNA QUANT, LOG: DETECTED {Log_copies}/mL — AB

## 2017-05-25 DIAGNOSIS — N184 Chronic kidney disease, stage 4 (severe): Secondary | ICD-10-CM | POA: Diagnosis not present

## 2017-05-25 DIAGNOSIS — G894 Chronic pain syndrome: Secondary | ICD-10-CM | POA: Diagnosis not present

## 2017-05-25 DIAGNOSIS — M545 Low back pain: Secondary | ICD-10-CM | POA: Diagnosis not present

## 2017-05-25 DIAGNOSIS — J9611 Chronic respiratory failure with hypoxia: Secondary | ICD-10-CM | POA: Diagnosis not present

## 2017-05-25 DIAGNOSIS — E1151 Type 2 diabetes mellitus with diabetic peripheral angiopathy without gangrene: Secondary | ICD-10-CM | POA: Diagnosis not present

## 2017-05-25 DIAGNOSIS — J449 Chronic obstructive pulmonary disease, unspecified: Secondary | ICD-10-CM | POA: Diagnosis not present

## 2017-05-25 DIAGNOSIS — I509 Heart failure, unspecified: Secondary | ICD-10-CM | POA: Diagnosis not present

## 2017-05-25 DIAGNOSIS — I130 Hypertensive heart and chronic kidney disease with heart failure and stage 1 through stage 4 chronic kidney disease, or unspecified chronic kidney disease: Secondary | ICD-10-CM | POA: Diagnosis not present

## 2017-05-25 DIAGNOSIS — E1122 Type 2 diabetes mellitus with diabetic chronic kidney disease: Secondary | ICD-10-CM | POA: Diagnosis not present

## 2017-05-27 ENCOUNTER — Other Ambulatory Visit: Payer: Self-pay | Admitting: Pulmonary Disease

## 2017-05-29 ENCOUNTER — Other Ambulatory Visit: Payer: Self-pay

## 2017-05-29 ENCOUNTER — Encounter: Payer: Self-pay | Admitting: Endocrinology

## 2017-05-29 ENCOUNTER — Ambulatory Visit (INDEPENDENT_AMBULATORY_CARE_PROVIDER_SITE_OTHER): Payer: Medicare HMO | Admitting: Endocrinology

## 2017-05-29 VITALS — BP 128/74 | HR 97 | Ht 70.0 in | Wt 237.6 lb

## 2017-05-29 DIAGNOSIS — Z794 Long term (current) use of insulin: Secondary | ICD-10-CM | POA: Diagnosis not present

## 2017-05-29 DIAGNOSIS — G894 Chronic pain syndrome: Secondary | ICD-10-CM | POA: Diagnosis not present

## 2017-05-29 DIAGNOSIS — E1165 Type 2 diabetes mellitus with hyperglycemia: Secondary | ICD-10-CM | POA: Diagnosis not present

## 2017-05-29 DIAGNOSIS — M545 Low back pain: Secondary | ICD-10-CM | POA: Diagnosis not present

## 2017-05-29 DIAGNOSIS — I130 Hypertensive heart and chronic kidney disease with heart failure and stage 1 through stage 4 chronic kidney disease, or unspecified chronic kidney disease: Secondary | ICD-10-CM | POA: Diagnosis not present

## 2017-05-29 DIAGNOSIS — E1122 Type 2 diabetes mellitus with diabetic chronic kidney disease: Secondary | ICD-10-CM | POA: Diagnosis not present

## 2017-05-29 DIAGNOSIS — J449 Chronic obstructive pulmonary disease, unspecified: Secondary | ICD-10-CM | POA: Diagnosis not present

## 2017-05-29 DIAGNOSIS — J9611 Chronic respiratory failure with hypoxia: Secondary | ICD-10-CM | POA: Diagnosis not present

## 2017-05-29 DIAGNOSIS — I509 Heart failure, unspecified: Secondary | ICD-10-CM | POA: Diagnosis not present

## 2017-05-29 DIAGNOSIS — N184 Chronic kidney disease, stage 4 (severe): Secondary | ICD-10-CM | POA: Diagnosis not present

## 2017-05-29 DIAGNOSIS — E1151 Type 2 diabetes mellitus with diabetic peripheral angiopathy without gangrene: Secondary | ICD-10-CM | POA: Diagnosis not present

## 2017-05-29 MED ORDER — INSULIN GLARGINE 300 UNIT/ML ~~LOC~~ SOPN: 60.0000 [IU] | PEN_INJECTOR | Freq: Two times a day (BID) | SUBCUTANEOUS | 3 refills | Status: DC

## 2017-05-29 MED ORDER — INSULIN REGULAR HUMAN (CONC) 500 UNIT/ML ~~LOC~~ SOPN: PEN_INJECTOR | SUBCUTANEOUS | 2 refills | Status: DC

## 2017-05-29 MED ORDER — LIRAGLUTIDE 18 MG/3ML ~~LOC~~ SOPN: 1.8000 mg | PEN_INJECTOR | Freq: Every day | SUBCUTANEOUS | 3 refills | Status: DC

## 2017-05-29 NOTE — Progress Notes (Signed)
Patient ID: [name], female   DOB: [birth year], 52 y.o.   MRN: [id number]           Reason for Appointment: Follow-up  for Type 2 Diabetes   History of Present Illness:          Date of diagnosis of type 2 diabetes mellitus: 2000       Background history:     The patient was apparently diagnosed incidentally with her diabetes 16 years ago 16 years ago, was asymptomatic. She took metformin and possibly other oral hypoglycemic drugs but subsequently went on insulin in 2005 She was taking various insulin regimens in the past  She was being treated by an endocrinologist some time ago and he switched her To Toujeo and also tried her on Byetta.  She thinks that she had better blood sugars with Byetta but she did not follow-up  Novolog was switched to U-500 insulin in 3/17   Recent history:   INSULIN regimen is:  Toujeo 60 units bid  Humulin R U-500: 40 units 10-11 am, 30 units before lunch 20 before supper  Non-insulin hypoglycemic drugs the patient is taking are: Victoza 1.20m    Her A1c is about the same at 8.6, previously 8.5  She has not been seen in follow-up since last August  Current management, blood sugar patterns and problems identified:  She has had her A1c done at the hospital when she was in for COPD and high blood sugar from prednisone but no labs done since then  He appears to be taking about the same amount of insulin although has reduced her Humulin R by 10 units arbitrarily  Checking blood sugars very sporadically and difficult to get a pattern, most of her readings are in the afternoons and before 6 PM  Usually not checking readings after evening meal  However she has a couple of readings late at night which were over 200 about 3 weeks ago  FASTING blood sugars are sporadic and appear to be relatively good  MEALTIME insulin: She appears to be taking this irregularly and sometimes after eating because she forgets to take them  No hypoglycemia except  once at 6 PM and she does not know why  Her weight is difficult to assess because of her prosthesis  Also continues to take Victoza regularly  Has not had any prednisone recently     Side effects from medications have been: None  Compliance with the medical regimen:  fair  Glucose monitoring:  done about 1 times a day or less       Glucometer: One Touch Verio.       Blood Glucose readings as follows  Mean values apply above for all meters except median for One Touch  PRE-MEAL Fasting Lunch  afternoon Bedtime Overall  Glucose range:  96-135   74-165    Mean/median:     81-240  137    Self-care:   Typical meal intake: Breakfast is mostly a yogurt, fruit; or egg/toast   Lunch is variable at 3 pm, has mixed meal at dinnertime 8 pm, sometimes skipped Has variable amounts of snacks.  She is avoiding drinks with sugar                [facility]              Exercise:  none  Weight history:  Wt Readings from Last 3 Encounters:  05/29/17 237 lb 9.6 oz (  107.8 kg)  03/02/17 215 lb (97.5 kg)  02/24/17 215 lb 9.8 oz (97.8 kg)    Glycemic control: 11 in 8/16   Lab Results  Component Value Date   HGBA1C 8.6 (H) 03/03/2017   HGBA1C 8.5 08/24/2016   HGBA1C 9.8 (H) 05/20/2016   Lab Results  Component Value Date   MICROALBUR 1.2 03/05/2016   LDLCALC 147 (H) 05/21/2017   CREATININE 1.23 (H) 05/21/2017    Other active problems: See review of systems    Allergies as of 05/29/2017      Reactions   Cortizone-10 [hydrocortisone] Rash   Per patient, given local injection at knee and developed rash at local site.       Medication List        Accurate as of 05/29/17  2:22 PM. Always use your most recent med list.          albuterol 108 (90 Base) MCG/ACT inhaler Commonly known as:  PROVENTIL HFA;VENTOLIN HFA INHALE 2 PUFFS INTO THE LUNGS EVERY 6 HOURS AS NEEDED FOR WHEEZING OR SHORTNESS OF BREATH   ARIPiprazole 10 MG  tablet Commonly known as:  ABILIFY Take 10 mg by mouth every morning.   BREO ELLIPTA 100-25 MCG/INH Aepb Generic drug:  fluticasone furoate-vilanterol INHALE 1 PUFF INTO THE LUNGS DAILY   clonazePAM 1 MG tablet Commonly known as:  KLONOPIN Take 1 mg by mouth 3 (three) times daily as needed for anxiety.   diphenoxylate-atropine 2.5-0.025 MG tablet Commonly known as:  LOMOTIL Take 1 tablet by mouth 4 (four) times daily as needed for diarrhea or loose stools.   doxepin 10 MG capsule Commonly known as:  SINEQUAN Take 10 mg by mouth at bedtime.   elvitegravir-cobicistat-emtricitabine-tenofovir 150-150-200-10 MG Tabs tablet Commonly known as:  GENVOYA Take 1 tablet by mouth daily.   fenofibrate 145 MG tablet Commonly known as:  TRICOR Take 145 mg by mouth daily.   furosemide 80 MG tablet Commonly known as:  LASIX Take 80 mg by mouth daily.   HYDROcodone-acetaminophen 10-325 MG tablet Commonly known as:  NORCO Take 1 tablet by mouth every 8 (eight) hours as needed (pain).   Insulin Glargine 300 UNIT/ML Sopn Commonly known as:  TOUJEO MAX SOLOSTAR Inject 60 Units into the skin 2 (two) times daily.   insulin regular human CONCENTRATED 500 UNIT/ML kwikpen Commonly known as:  HUMULIN R U-500 KWIKPEN INJECT 60 UNITS BEFORE BREAKFAST, 50 UNITS BEFORE LUNCH, AND 35 UNITS BEFORE DINNER.   liraglutide 18 MG/3ML Sopn Commonly known as:  VICTOZA INJECT 1.8 MG UNDER THE SKIN DAILY   liraglutide 18 MG/3ML Sopn Commonly known as:  VICTOZA Inject 0.3 mLs (1.8 mg total) into the skin daily.   MAGNESIUM-OXIDE 400 (241.3 Mg) MG tablet Generic drug:  magnesium oxide Take 400 mg by mouth daily.   NARCAN 4 MG/0.1ML Liqd nasal spray kit Generic drug:  naloxone Place 1 spray into the nose as needed. overdose   pantoprazole 40 MG tablet Commonly known as:  PROTONIX Take 40 mg by mouth daily.   potassium chloride 10 MEQ tablet Commonly known as:  K-DUR Take 1 tablet (10 mEq total)  by mouth daily.   potassium chloride SA 20 MEQ tablet Commonly known as:  K-DUR,KLOR-CON Take 1 tablet (20 mEq total) by mouth daily. X 3 days, then check potassium levels if needed to continue   pregabalin 225 MG capsule Commonly known as:  LYRICA Take 225 mg by mouth 2 (two) times daily.   rosuvastatin 20 MG tablet  Commonly known as:  CRESTOR Take 20 mg by mouth daily. Reported on 05/02/2015   SYMBICORT 80-4.5 MCG/ACT inhaler Generic drug:  budesonide-formoterol Inhale 1 puff into the lungs daily.   temazepam 15 MG capsule Commonly known as:  RESTORIL Take 30 mg by mouth at bedtime as needed for sleep.   tiZANidine 4 MG tablet Commonly known as:  ZANAFLEX Take 4 mg by mouth daily.       Allergies:  Allergies  Allergen Reactions  . Cortizone-10 [Hydrocortisone] Rash    Per patient, given local injection at knee and developed rash at local site.     Past Medical History:  Diagnosis Date  . Acute metabolic encephalopathy 1/61/0960  . Acute on chronic respiratory failure with hypoxia ([place]) 05/19/2016  . Anxiety   . ARF (acute renal failure) ([place]) 05/03/2014  . Arthritis    LEFT HIP  . Asthma    HOSPITALIZED WITH EXCERBATION OF ASTHMA - AND BRONCHITIS AND THE FLU DEC 2013  . Asthma exacerbation attacks 12/28/2011  . Bronchitis 12/28/2011  . CAP (community acquired pneumonia) 07/25/2012  . Chronic kidney disease (CKD), stage IV (severe) ([place]) 11/10/2014  . Class 3 obesity due to excess calories with serious comorbidity and body mass index (BMI) of 40.0 to 44.9 in adult   . Colles' fracture of left radius   . Depression   . Diabetes mellitus    ON INSULIN AND ORAL MEDICATIONS  . Diabetes mellitus type 2 with complications, uncontrolled ([place]) 04/19/2008   Qualifier: Diagnosis of  By: Tomma Lightning MD, Claiborne Billings     . Diabetic foot infection ([place]) 11/09/2014  . Diabetic ketoacidosis without coma associated with type 2 diabetes mellitus ([place])   . Diabetic neuropathy ([place]) 11/10/2014   . Diarrhea 08/30/2008   Qualifier: Diagnosis of  By: Tomma Lightning MD, Claiborne Billings    . Diastolic dysfunction 45/04/979  . Difficult intravenous access   . DKA, type 2 ([place]) 04/04/2016  . Dyslipidemia 12/23/2008   Qualifier: Diagnosis of  By: Tomma Lightning MD, Claiborne Billings    . Essential hypertension 04/19/2008   Qualifier: Diagnosis of  By: Tomma Lightning MD, Claiborne Billings    . FATIGUE 07/12/2008   Qualifier: Diagnosis of  By: Tomma Lightning MD, Claiborne Billings    . Femoral neck fracture ([place]) 07/16/2011  . Femur fracture, left ([place]) 07/08/2014  . Fever   . Fracture of distal femur ([place]) 07/08/2014  . GERD (gastroesophageal reflux disease)   . Hereditary and idiopathic peripheral neuropathy 04/19/2008   Qualifier: Diagnosis of  By: Tomma Lightning MD, Claiborne Billings    . Hip fracture, left ([place]) 07/16/2011  . Hip pain   . HIV infection ([place]) 2000  . Human immunodeficiency virus (HIV) disease ([place]) 04/19/2008   [id number] +   . Hyperlipidemia   . Hypertension   . Hyponatremia 12/29/2011  . Influenza A 12/29/2011  . Influenza B   . Insomnia 06/12/2016  . Left hip pain 05/03/2014  . Mood disorder ([place]) 04/19/2008   Qualifier: Diagnosis of  By: Tomma Lightning MD, Claiborne Billings    . Neuropathy    NEUROPATHY HANDS AND FEET  . NSTEMI (non-ST elevated myocardial infarction) ([place]) 05/19/2016  . Obesity hypoventilation syndrome ([place])   . Osteoporosis 07/08/2014  . Pain    SEVERE PAIN LEFT HIP - HX OF LEFT HIP PINNING JULY 2013  . Perimenopausal symptoms 02/13/2012   LMP around 2011. On estrace and provera since around 2012 for hot flashes, mood swings. Estrace 2 mg daily, Provera 2.5 mg for 5 days each month.   [name] Kitchen  Repeated falls 07/30/2011  . Sepsis ([place]) 04/04/2016  . Shortness of breath    ALLERGIES ARE "ACTING UP"  . SOB (shortness of breath)   . THRUSH 05/20/2008   Qualifier: Diagnosis of  By: Tomma Lightning MD, Claiborne Billings      . Tobacco use disorder 04/14/2010    Past Surgical History:  Procedure Laterality Date  . AMPUTATION Left 11/18/2014   Procedure: LEFT BELOW KNEE AMPUTATION;  Surgeon:  Leandrew Koyanagi, MD;  Location: [facility];  Service: Orthopedics;  Laterality: Left;  . CAST APPLICATION Left 7/0/1410   Procedure: CAST APPLICATION (FIBERGLASS);  Surgeon: Latanya Maudlin, MD;  Location: [facility];  Service: Orthopedics;  Laterality: Left;  CLOSED REDUCTION OF LEFT COLLES FRACTURE WITH SHORT ARM CAST  . HARDWARE REMOVAL Left 05/05/2014   Procedure: HARDWARE REMOVAL LEFT HIP;  Surgeon: Latanya Maudlin, MD;  Location: [facility];  Service: Orthopedics;  Laterality: Left;  REMOVAL BIOMET 6.5-8.0 CANNULATED SCREW  . HIP ARTHROPLASTY Left 05/05/2014   Procedure: ARTHROPLASTY OPEN REDUCTION INTERNAL FIXATION LEFT HIP AND REMOVAL OF TWO CANNULATED SCREW;  Surgeon: Latanya Maudlin, MD;  Location: [facility];  Service: Orthopedics;  Laterality: Left;  . HIP PINNING,CANNULATED  07/16/2011   Procedure: CANNULATED HIP PINNING;  Surgeon: Gearlean Alf, MD;  Location: [facility];  Service: Orthopedics;  Laterality: Left;  . HIP PINNING,CANNULATED Left 04/09/2012   Procedure: CANNULATED HIP PINNING AND HARDWARE REVISION;  Surgeon: Gearlean Alf, MD;  Location: [facility];  Service: Orthopedics;  Laterality: Left;  . I&D EXTREMITY Left 11/16/2014   Procedure:  DEBRIDEMENT OF LEFT FOOT POSSIBLE APPLICATION OF INTEGRIA AND VAC ;  Surgeon: Irene Limbo, MD;  Location: [facility];  Service: Plastics;  Laterality: Left;  . PERIPHERAL VASCULAR CATHETERIZATION N/A 11/15/2014   Procedure: Abdominal Aortogram;  Surgeon: Conrad Lisle, MD;  Location: [facility];  Service: Cardiovascular;  Laterality: N/A;  . PERIPHERAL VASCULAR CATHETERIZATION  11/15/2014   Procedure: Lower Extremity Angiography;  Surgeon: Conrad Duarte, MD;  Location: [facility];  Service: Cardiovascular;;  . PERIPHERAL VASCULAR CATHETERIZATION Left 11/15/2014   Procedure: Peripheral Vascular Intervention;  Surgeon: Conrad [place], MD;  Location: [facility];  Service: Cardiovascular;  Laterality: Left;  popliteal artery stenting    Family History    Problem Relation Age of Onset  . Diabetes Mother   . Hypertension Mother   . Vision loss Mother   . Heart disease Mother   . Hypertension Father   . Thyroid disease Neg Hx     Social History:  reports that she has been smoking e-cigarettes and cigarettes.  She has a 17.50 pack-year smoking history. She has never used smokeless tobacco. She reports that she does not drink alcohol or use drugs.    Review of Systems   RENAL dysfunction: Stable  Lab Results  Component Value Date   CREATININE 1.23 (H) 05/21/2017   BUN 28 (H) 05/21/2017   NA 139 05/21/2017   K 4.8 05/21/2017   CL 101 05/21/2017   CO2 32 05/21/2017     Lipid history: Has been treated with  Fenofibrate and Crestor by PCP and not clear why her levels are higher    Lab Results  Component Value Date   CHOL 250 (H) 05/21/2017   HDL 73 05/21/2017   LDLCALC 147 (H) 05/21/2017   TRIG 163 (H) 05/21/2017   CHOLHDL 3.4 05/21/2017          Currently on high-dose Lyrica  Physical Examination:  BP 128/74 (BP Location: Right Arm, Patient Position: Sitting, Cuff Size: Large)   Pulse 97   Ht 5' 10"  (1.778 m)   Wt 237 lb 9.6 oz (107.8 kg)   LMP 11/16/2010   SpO2 93%   BMI 34.09 kg/m       ASSESSMENT:  Diabetes type 2, uncontrolled    See history of present illness for detailed discussion of current diabetes management, blood sugar patterns and problems identified  A1c is 8.6 but this was done 2 months ago She has fairly good blood sugars at home but taking sporadically Has only a couple of significant dehydrating late at night This may be related to her eating larger meals but also because she does not always take her mealtime insulin before eating and sometimes after eating because she forgets Overall seems to be benefiting from using the U-500 insulin   PLAN:   She will need to check blood sugars at least once a day She does need to do some readings after supper To try and take her insulin before  eating consistently before starting cooking or before leaving for going out to eat No change in Toujeo as yet If her sugars are consistently high after supper she will increase her regular insulin by 10 units again but may need to reduce Toujeo Does need more regular follow-up    Patient Instructions  Humulin R,  30 min before the meals  Check blood sugars on waking up  3-4/7 days  Also check blood sugars about 2 hours after a meal and do this after different meals by rotation  Recommended blood sugar levels on waking up is 90-130 and about 2 hours after meal is 130-160  Please bring your blood sugar monitor to each visit, thank you     Elayne Snare 05/29/2017, 2:22 PM   Note: This office note was prepared with Dragon voice recognition system technology. Any transcriptional errors that result from this process are unintentional.

## 2017-05-29 NOTE — Patient Instructions (Addendum)
Humulin R,  30 min before the meals  Check blood sugars on waking up  3-4/7 days  Also check blood sugars about 2 hours after a meal and do this after different meals by rotation  Recommended blood sugar levels on waking up is 90-130 and about 2 hours after meal is 130-160  Please bring your blood sugar monitor to each visit, thank you

## 2017-05-30 ENCOUNTER — Ambulatory Visit: Payer: Self-pay | Admitting: Endocrinology

## 2017-06-05 DIAGNOSIS — J9611 Chronic respiratory failure with hypoxia: Secondary | ICD-10-CM | POA: Diagnosis not present

## 2017-06-05 DIAGNOSIS — N184 Chronic kidney disease, stage 4 (severe): Secondary | ICD-10-CM | POA: Diagnosis not present

## 2017-06-05 DIAGNOSIS — I130 Hypertensive heart and chronic kidney disease with heart failure and stage 1 through stage 4 chronic kidney disease, or unspecified chronic kidney disease: Secondary | ICD-10-CM | POA: Diagnosis not present

## 2017-06-05 DIAGNOSIS — I509 Heart failure, unspecified: Secondary | ICD-10-CM | POA: Diagnosis not present

## 2017-06-05 DIAGNOSIS — M545 Low back pain: Secondary | ICD-10-CM | POA: Diagnosis not present

## 2017-06-05 DIAGNOSIS — E1151 Type 2 diabetes mellitus with diabetic peripheral angiopathy without gangrene: Secondary | ICD-10-CM | POA: Diagnosis not present

## 2017-06-05 DIAGNOSIS — J449 Chronic obstructive pulmonary disease, unspecified: Secondary | ICD-10-CM | POA: Diagnosis not present

## 2017-06-05 DIAGNOSIS — E1122 Type 2 diabetes mellitus with diabetic chronic kidney disease: Secondary | ICD-10-CM | POA: Diagnosis not present

## 2017-06-05 DIAGNOSIS — G894 Chronic pain syndrome: Secondary | ICD-10-CM | POA: Diagnosis not present

## 2017-06-14 DIAGNOSIS — Z79899 Other long term (current) drug therapy: Secondary | ICD-10-CM | POA: Diagnosis not present

## 2017-06-14 DIAGNOSIS — G546 Phantom limb syndrome with pain: Secondary | ICD-10-CM | POA: Diagnosis not present

## 2017-06-14 DIAGNOSIS — M5136 Other intervertebral disc degeneration, lumbar region: Secondary | ICD-10-CM | POA: Diagnosis not present

## 2017-06-18 DIAGNOSIS — N184 Chronic kidney disease, stage 4 (severe): Secondary | ICD-10-CM | POA: Diagnosis not present

## 2017-06-18 DIAGNOSIS — F411 Generalized anxiety disorder: Secondary | ICD-10-CM | POA: Diagnosis not present

## 2017-06-18 DIAGNOSIS — J9611 Chronic respiratory failure with hypoxia: Secondary | ICD-10-CM | POA: Diagnosis not present

## 2017-06-18 DIAGNOSIS — I509 Heart failure, unspecified: Secondary | ICD-10-CM | POA: Diagnosis not present

## 2017-06-18 DIAGNOSIS — I130 Hypertensive heart and chronic kidney disease with heart failure and stage 1 through stage 4 chronic kidney disease, or unspecified chronic kidney disease: Secondary | ICD-10-CM | POA: Diagnosis not present

## 2017-06-18 DIAGNOSIS — J449 Chronic obstructive pulmonary disease, unspecified: Secondary | ICD-10-CM | POA: Diagnosis not present

## 2017-06-18 DIAGNOSIS — M545 Low back pain: Secondary | ICD-10-CM | POA: Diagnosis not present

## 2017-06-18 DIAGNOSIS — E1122 Type 2 diabetes mellitus with diabetic chronic kidney disease: Secondary | ICD-10-CM | POA: Diagnosis not present

## 2017-06-18 DIAGNOSIS — G894 Chronic pain syndrome: Secondary | ICD-10-CM | POA: Diagnosis not present

## 2017-06-18 DIAGNOSIS — E1151 Type 2 diabetes mellitus with diabetic peripheral angiopathy without gangrene: Secondary | ICD-10-CM | POA: Diagnosis not present

## 2017-06-19 DIAGNOSIS — Z5181 Encounter for therapeutic drug level monitoring: Secondary | ICD-10-CM | POA: Diagnosis not present

## 2017-06-19 DIAGNOSIS — Z79899 Other long term (current) drug therapy: Secondary | ICD-10-CM | POA: Diagnosis not present

## 2017-06-19 DIAGNOSIS — F411 Generalized anxiety disorder: Secondary | ICD-10-CM | POA: Diagnosis not present

## 2017-06-21 DIAGNOSIS — J45901 Unspecified asthma with (acute) exacerbation: Secondary | ICD-10-CM | POA: Diagnosis not present

## 2017-06-21 DIAGNOSIS — M6281 Muscle weakness (generalized): Secondary | ICD-10-CM | POA: Diagnosis not present

## 2017-06-21 DIAGNOSIS — J449 Chronic obstructive pulmonary disease, unspecified: Secondary | ICD-10-CM | POA: Diagnosis not present

## 2017-06-21 DIAGNOSIS — J45909 Unspecified asthma, uncomplicated: Secondary | ICD-10-CM | POA: Diagnosis not present

## 2017-06-21 DIAGNOSIS — M545 Low back pain: Secondary | ICD-10-CM | POA: Diagnosis not present

## 2017-06-21 DIAGNOSIS — N184 Chronic kidney disease, stage 4 (severe): Secondary | ICD-10-CM | POA: Diagnosis not present

## 2017-06-21 DIAGNOSIS — S72002D Fracture of unspecified part of neck of left femur, subsequent encounter for closed fracture with routine healing: Secondary | ICD-10-CM | POA: Diagnosis not present

## 2017-07-01 DIAGNOSIS — I130 Hypertensive heart and chronic kidney disease with heart failure and stage 1 through stage 4 chronic kidney disease, or unspecified chronic kidney disease: Secondary | ICD-10-CM | POA: Diagnosis not present

## 2017-07-01 DIAGNOSIS — E1122 Type 2 diabetes mellitus with diabetic chronic kidney disease: Secondary | ICD-10-CM | POA: Diagnosis not present

## 2017-07-01 DIAGNOSIS — E1151 Type 2 diabetes mellitus with diabetic peripheral angiopathy without gangrene: Secondary | ICD-10-CM | POA: Diagnosis not present

## 2017-07-01 DIAGNOSIS — J449 Chronic obstructive pulmonary disease, unspecified: Secondary | ICD-10-CM | POA: Diagnosis not present

## 2017-07-01 DIAGNOSIS — M545 Low back pain: Secondary | ICD-10-CM | POA: Diagnosis not present

## 2017-07-01 DIAGNOSIS — N184 Chronic kidney disease, stage 4 (severe): Secondary | ICD-10-CM | POA: Diagnosis not present

## 2017-07-01 DIAGNOSIS — I509 Heart failure, unspecified: Secondary | ICD-10-CM | POA: Diagnosis not present

## 2017-07-01 DIAGNOSIS — J9611 Chronic respiratory failure with hypoxia: Secondary | ICD-10-CM | POA: Diagnosis not present

## 2017-07-01 DIAGNOSIS — G894 Chronic pain syndrome: Secondary | ICD-10-CM | POA: Diagnosis not present

## 2017-07-08 ENCOUNTER — Telehealth: Payer: Self-pay

## 2017-07-08 NOTE — Telephone Encounter (Signed)
    Patient calling for lab results 

## 2017-07-09 NOTE — Telephone Encounter (Signed)
Patient called back. RN relayed results. Landis Gandy, RN

## 2017-07-10 DIAGNOSIS — F411 Generalized anxiety disorder: Secondary | ICD-10-CM | POA: Diagnosis not present

## 2017-07-21 DIAGNOSIS — J45909 Unspecified asthma, uncomplicated: Secondary | ICD-10-CM | POA: Diagnosis not present

## 2017-07-21 DIAGNOSIS — N184 Chronic kidney disease, stage 4 (severe): Secondary | ICD-10-CM | POA: Diagnosis not present

## 2017-07-21 DIAGNOSIS — M6281 Muscle weakness (generalized): Secondary | ICD-10-CM | POA: Diagnosis not present

## 2017-07-21 DIAGNOSIS — J449 Chronic obstructive pulmonary disease, unspecified: Secondary | ICD-10-CM | POA: Diagnosis not present

## 2017-07-21 DIAGNOSIS — S72002D Fracture of unspecified part of neck of left femur, subsequent encounter for closed fracture with routine healing: Secondary | ICD-10-CM | POA: Diagnosis not present

## 2017-07-21 DIAGNOSIS — M545 Low back pain: Secondary | ICD-10-CM | POA: Diagnosis not present

## 2017-07-21 DIAGNOSIS — J45901 Unspecified asthma with (acute) exacerbation: Secondary | ICD-10-CM | POA: Diagnosis not present

## 2017-07-26 DIAGNOSIS — Z79899 Other long term (current) drug therapy: Secondary | ICD-10-CM | POA: Diagnosis not present

## 2017-07-26 DIAGNOSIS — F1120 Opioid dependence, uncomplicated: Secondary | ICD-10-CM | POA: Diagnosis not present

## 2017-07-26 DIAGNOSIS — G894 Chronic pain syndrome: Secondary | ICD-10-CM | POA: Diagnosis not present

## 2017-07-26 DIAGNOSIS — M5136 Other intervertebral disc degeneration, lumbar region: Secondary | ICD-10-CM | POA: Diagnosis not present

## 2017-07-29 ENCOUNTER — Other Ambulatory Visit (INDEPENDENT_AMBULATORY_CARE_PROVIDER_SITE_OTHER): Payer: Medicare HMO

## 2017-07-29 DIAGNOSIS — Z794 Long term (current) use of insulin: Secondary | ICD-10-CM | POA: Diagnosis not present

## 2017-07-29 DIAGNOSIS — E1165 Type 2 diabetes mellitus with hyperglycemia: Secondary | ICD-10-CM

## 2017-07-29 LAB — BASIC METABOLIC PANEL
BUN: 29 mg/dL — AB (ref 6–23)
CHLORIDE: 101 meq/L (ref 96–112)
CO2: 32 meq/L (ref 19–32)
CREATININE: 1.14 mg/dL (ref 0.40–1.20)
Calcium: 10 mg/dL (ref 8.4–10.5)
GFR: 52.93 mL/min — ABNORMAL LOW (ref >60.00)
Glucose, Bld: 122 mg/dL — ABNORMAL HIGH (ref 70–99)
Potassium: 5.4 mEq/L — ABNORMAL HIGH (ref 3.5–5.1)
Sodium: 140 mEq/L (ref 135–145)

## 2017-07-29 LAB — HEMOGLOBIN A1C: Hgb A1c MFr Bld: 8.3 % — ABNORMAL HIGH (ref 4.6–6.5)

## 2017-07-31 ENCOUNTER — Ambulatory Visit: Payer: Self-pay | Admitting: Endocrinology

## 2017-07-31 DIAGNOSIS — Z0289 Encounter for other administrative examinations: Secondary | ICD-10-CM

## 2017-08-08 DIAGNOSIS — F411 Generalized anxiety disorder: Secondary | ICD-10-CM | POA: Diagnosis not present

## 2017-08-13 ENCOUNTER — Encounter: Payer: Self-pay | Admitting: Endocrinology

## 2017-08-13 ENCOUNTER — Ambulatory Visit (INDEPENDENT_AMBULATORY_CARE_PROVIDER_SITE_OTHER): Payer: Medicare HMO | Admitting: Endocrinology

## 2017-08-13 VITALS — BP 160/100 | HR 93 | Temp 98.6°F | Ht 70.0 in

## 2017-08-13 DIAGNOSIS — Z794 Long term (current) use of insulin: Secondary | ICD-10-CM | POA: Diagnosis not present

## 2017-08-13 DIAGNOSIS — E1165 Type 2 diabetes mellitus with hyperglycemia: Secondary | ICD-10-CM

## 2017-08-13 DIAGNOSIS — E875 Hyperkalemia: Secondary | ICD-10-CM | POA: Diagnosis not present

## 2017-08-13 LAB — COMPREHENSIVE METABOLIC PANEL
ALK PHOS: 53 U/L (ref 39–117)
ALT: 20 U/L (ref 0–35)
AST: 12 U/L (ref 0–37)
Albumin: 4.2 g/dL (ref 3.5–5.2)
BUN: 50 mg/dL — ABNORMAL HIGH (ref 6–23)
CO2: 27 mEq/L (ref 19–32)
Calcium: 9.8 mg/dL (ref 8.4–10.5)
Chloride: 104 mEq/L (ref 96–112)
Creatinine, Ser: 1.63 mg/dL — ABNORMAL HIGH (ref 0.40–1.20)
GFR: 35.03 mL/min — AB (ref >60.00)
GLUCOSE: 208 mg/dL — AB (ref 70–99)
POTASSIUM: 4.3 meq/L (ref 3.5–5.1)
Sodium: 139 mEq/L (ref 135–145)
TOTAL PROTEIN: 7 g/dL (ref 6.0–8.3)
Total Bilirubin: 0.3 mg/dL (ref 0.2–1.2)

## 2017-08-13 NOTE — Patient Instructions (Addendum)
Check blood sugars on waking up 5/7 days   Also check blood sugars about 2 hours after a meal and do this after different meals by rotation  Recommended blood sugar levels on waking up is 90-130 and about 2 hours after meal is 130-160  Please bring your blood sugar monitor to each visit, thank you  More after lunch, supper  REDUCE Toujeo to 50 units in the evening continue 60 in the morning   take 40 AT average meals and 50 AT large meals at lunchtime

## 2017-08-13 NOTE — Progress Notes (Signed)
Patient ID: [name], female   DOB: 1965/12/30, 52 y.o.   MRN: [id number]           Reason for Appointment: Follow-up  for Type 2 Diabetes   History of Present Illness:          Date of diagnosis of type 2 diabetes mellitus: 2000       Background history:     The patient was apparently diagnosed incidentally with her diabetes 16 years ago 16 years ago, was asymptomatic. She took metformin and possibly other oral hypoglycemic drugs but subsequently went on insulin in 2005 She was taking various insulin regimens in the past  She was being treated by an endocrinologist some time ago and he switched her To Toujeo and also tried her on Byetta.  She thinks that she had better blood sugars with Byetta but she did not follow-up  Novolog was switched to U-500 insulin in 3/17   Recent history:   INSULIN regimen is:  Toujeo 60 units bid  Humulin R U-500: 40 units around 10-11 am, 30 units before lunch   Non-insulin hypoglycemic drugs the patient is taking are: Victoza 1.61m    Her A1c has continued to stay over 8%, now 8.3  Current management, blood sugar patterns and problems identified:  She recently has checked her blood sugars mostly in the mornings and not after meals as directed  Only sporadically will do a blood sugar after her first meal and rarely later in the evening  For the last 10 days or so she has had periodic low blood sugars, 4 times below 70  This is without any apparent change in her diet  However on some mornings her blood sugars are over 200 and she cannot explain why  She is not clear about how much insulin she is taking and on the Humulin R she thinks she is taking 4 units but after showing her the demonstration pen she appears to be probably taking 40 units in the morning and 30 units in the evening  She believes she is taking her insulin doses consistently but this is doubtful since she has variability in her blood sugars  Highest blood sugar was 381  after [place]  Has only one reading later after supper and this was not high, blood sugars during the night have been somewhat variable also  However OVERNIGHT blood sugars are mostly high, being checked between 1-3 AM  Her weight is difficult to assess because of her prosthesis  She continues to take Victoza regularly  Has not had any prednisone recently    Side effects from medications have been: None  Compliance with the medical regimen:  fair  Glucose monitoring:  done about 1-2 times a day       Glucometer: Accu-Chek    Blood Glucose readings from monitor download:    PRE-MEAL Fasting Lunch Dinner  overnight  Overall  Glucose range:  48-275   92-237   Mean/median:  139    179  152   POST-MEAL PC Breakfast PC Lunch PC Dinner  Glucose range:   99-381   Mean/median:   164      Self-care:   Typical meal intake: Breakfast is mostly a yogurt, fruit; or egg/toast  First meal at about 11 AM, has mixed meal at dinnertime 8 pm, usually 2 meals a day Has variable amounts of snacks.              Dietician visit, most  recent: At [facility]               Exercise:  none  Weight history:  Wt Readings from Last 3 Encounters:  05/29/17 237 lb 9.6 oz (107.8 kg)  03/02/17 215 lb (97.5 kg)  02/24/17 215 lb 9.8 oz (97.8 kg)    Glycemic control: Baseline A1c is 11 in 8/16   Lab Results  Component Value Date   HGBA1C 8.3 (H) 07/29/2017   HGBA1C 8.6 (H) 03/03/2017   HGBA1C 8.5 08/24/2016   Lab Results  Component Value Date   MICROALBUR 1.2 03/05/2016   LDLCALC 147 (H) 05/21/2017   CREATININE 1.14 07/29/2017   No visits with results within 1 Week(s) from this visit.  Latest known visit with results is:  Lab on 07/29/2017  Component Date Value Ref Range Status  . Sodium 07/29/2017 140  135 - 145 mEq/L Final  . Potassium 07/29/2017 5.4* 3.5 - 5.1 mEq/L Final  . Chloride 07/29/2017 101  96 - 112 mEq/L Final  . CO2 07/29/2017 32  19 - 32 mEq/L Final  .  Glucose, Bld 07/29/2017 122* 70 - 99 mg/dL Final  . BUN 07/29/2017 29* 6 - 23 mg/dL Final  . Creatinine, Ser 07/29/2017 1.14  0.40 - 1.20 mg/dL Final  . Calcium 07/29/2017 10.0  8.4 - 10.5 mg/dL Final  . GFR 07/29/2017 52.93* >60.00 mL/min Final  . Hgb A1c MFr Bld 07/29/2017 8.3* 4.6 - 6.5 % Final   Glycemic Control Guidelines for People with Diabetes:Non Diabetic:  <6%Goal of Therapy: <7%Additional Action Suggested:  >8%     Other active problems: See review of systems    Allergies as of 08/13/2017      Reactions   Cortizone-10 [hydrocortisone] Rash   Per patient, given local injection at knee and developed rash at local site.       Medication List        Accurate as of 08/13/17  9:11 AM. Always use your most recent med list.          albuterol 108 (90 Base) MCG/ACT inhaler Commonly known as:  PROVENTIL HFA;VENTOLIN HFA INHALE 2 PUFFS INTO THE LUNGS EVERY 6 HOURS AS NEEDED FOR WHEEZING OR SHORTNESS OF BREATH   ARIPiprazole 10 MG tablet Commonly known as:  ABILIFY Take 10 mg by mouth every morning.   BREO ELLIPTA 100-25 MCG/INH Aepb Generic drug:  fluticasone furoate-vilanterol INHALE 1 PUFF INTO THE LUNGS DAILY   clonazePAM 1 MG tablet Commonly known as:  KLONOPIN Take 1 mg by mouth 3 (three) times daily as needed for anxiety.   diphenoxylate-atropine 2.5-0.025 MG tablet Commonly known as:  LOMOTIL Take 1 tablet by mouth 4 (four) times daily as needed for diarrhea or loose stools.   doxepin 10 MG capsule Commonly known as:  SINEQUAN Take 10 mg by mouth at bedtime.   elvitegravir-cobicistat-emtricitabine-tenofovir 150-150-200-10 MG Tabs tablet Commonly known as:  GENVOYA Take 1 tablet by mouth daily.   fenofibrate 145 MG tablet Commonly known as:  TRICOR Take 145 mg by mouth daily.   furosemide 80 MG tablet Commonly known as:  LASIX Take 80 mg by mouth daily.   HYDROcodone-acetaminophen 10-325 MG tablet Commonly known as:  NORCO Take 1 tablet by mouth  every 8 (eight) hours as needed (pain).   Insulin Glargine 300 UNIT/ML Sopn Inject 60 Units into the skin 2 (two) times daily.   insulin regular human CONCENTRATED 500 UNIT/ML kwikpen Commonly known as:  HUMULIN R INJECT 60 UNITS  BEFORE BREAKFAST, 50 UNITS BEFORE LUNCH, AND 35 UNITS BEFORE DINNER.   liraglutide 18 MG/3ML Sopn Commonly known as:  VICTOZA Inject 0.3 mLs (1.8 mg total) into the skin daily.   MAGNESIUM-OXIDE 400 (241.3 Mg) MG tablet Generic drug:  magnesium oxide Take 400 mg by mouth daily.   NARCAN 4 MG/0.1ML Liqd nasal spray kit Generic drug:  naloxone Place 1 spray into the nose as needed. overdose   pantoprazole 40 MG tablet Commonly known as:  PROTONIX Take 40 mg by mouth daily.   potassium chloride 10 MEQ tablet Commonly known as:  K-DUR Take 1 tablet (10 mEq total) by mouth daily.   potassium chloride SA 20 MEQ tablet Commonly known as:  K-DUR,KLOR-CON Take 1 tablet (20 mEq total) by mouth daily. X 3 days, then check potassium levels if needed to continue   pregabalin 225 MG capsule Commonly known as:  LYRICA Take 225 mg by mouth 2 (two) times daily.   rosuvastatin 20 MG tablet Commonly known as:  CRESTOR Take 20 mg by mouth daily. Reported on 05/02/2015   SYMBICORT 80-4.5 MCG/ACT inhaler Generic drug:  budesonide-formoterol Inhale 1 puff into the lungs daily.   temazepam 15 MG capsule Commonly known as:  RESTORIL Take 30 mg by mouth at bedtime as needed for sleep.   tiZANidine 4 MG tablet Commonly known as:  ZANAFLEX Take 4 mg by mouth daily.       Allergies:  Allergies  Allergen Reactions  . Cortizone-10 [Hydrocortisone] Rash    Per patient, given local injection at knee and developed rash at local site.     Past Medical History:  Diagnosis Date  . Acute metabolic encephalopathy 8/46/6599  . Acute on chronic respiratory failure with hypoxia ([place]) 05/19/2016  . Anxiety   . ARF (acute renal failure) ([place]) 05/03/2014  . Arthritis     LEFT HIP  . Asthma    HOSPITALIZED WITH EXCERBATION OF ASTHMA - AND BRONCHITIS AND THE FLU DEC 2013  . Asthma exacerbation attacks 12/28/2011  . Bronchitis 12/28/2011  . CAP (community acquired pneumonia) 07/25/2012  . Chronic kidney disease (CKD), stage IV (severe) ([place]) 11/10/2014  . Class 3 obesity due to excess calories with serious comorbidity and body mass index (BMI) of 40.0 to 44.9 in adult   . Colles' fracture of left radius   . Depression   . Diabetes mellitus    ON INSULIN AND ORAL MEDICATIONS  . Diabetes mellitus type 2 with complications, uncontrolled ([place]) 04/19/2008   Qualifier: Diagnosis of  By: Tomma Lightning MD, Claiborne Billings     . Diabetic foot infection ([place]) 11/09/2014  . Diabetic ketoacidosis without coma associated with type 2 diabetes mellitus ([place])   . Diabetic neuropathy ([place]) 11/10/2014  . Diarrhea 08/30/2008   Qualifier: Diagnosis of  By: Tomma Lightning MD, Claiborne Billings    . Diastolic dysfunction 35/07/175  . Difficult intravenous access   . DKA, type 2 ([place]) 04/04/2016  . Dyslipidemia 12/23/2008   Qualifier: Diagnosis of  By: Tomma Lightning MD, Claiborne Billings    . Essential hypertension 04/19/2008   Qualifier: Diagnosis of  By: Tomma Lightning MD, Claiborne Billings    . FATIGUE 07/12/2008   Qualifier: Diagnosis of  By: Tomma Lightning MD, Claiborne Billings    . Femoral neck fracture ([place]) 07/16/2011  . Femur fracture, left ([place]) 07/08/2014  . Fever   . Fracture of distal femur ([place]) 07/08/2014  . GERD (gastroesophageal reflux disease)   . Hereditary and idiopathic peripheral neuropathy 04/19/2008   Qualifier: Diagnosis of  By: Tomma Lightning MD,  Claiborne Billings    . Hip fracture, left ([place]) 07/16/2011  . Hip pain   . HIV infection ([place]) 2000  . Human immunodeficiency virus (HIV) disease ([place]) 04/19/2008   [id number] +   . Hyperlipidemia   . Hypertension   . Hyponatremia 12/29/2011  . Influenza A 12/29/2011  . Influenza B   . Insomnia 06/12/2016  . Left hip pain 05/03/2014  . Mood disorder ([place]) 04/19/2008   Qualifier: Diagnosis of  By: Tomma Lightning MD, Claiborne Billings    .  Neuropathy    NEUROPATHY HANDS AND FEET  . NSTEMI (non-ST elevated myocardial infarction) ([place]) 05/19/2016  . Obesity hypoventilation syndrome ([place])   . Osteoporosis 07/08/2014  . Pain    SEVERE PAIN LEFT HIP - HX OF LEFT HIP PINNING JULY 2013  . Perimenopausal symptoms 02/13/2012   LMP around 2011. On estrace and provera since around 2012 for hot flashes, mood swings. Estrace 2 mg daily, Provera 2.5 mg for 5 days each month.   . Repeated falls 07/30/2011  . Sepsis ([place]) 04/04/2016  . Shortness of breath    ALLERGIES ARE "ACTING UP"  . SOB (shortness of breath)   . THRUSH 05/20/2008   Qualifier: Diagnosis of  By: Tomma Lightning MD, Claiborne Billings      . Tobacco use disorder 04/14/2010    Past Surgical History:  Procedure Laterality Date  . AMPUTATION Left 11/18/2014   Procedure: LEFT BELOW KNEE AMPUTATION;  Surgeon: Leandrew Koyanagi, MD;  Location: [facility];  Service: Orthopedics;  Laterality: Left;  . CAST APPLICATION Left 04/02/6382   Procedure: CAST APPLICATION (FIBERGLASS);  Surgeon: Latanya Maudlin, MD;  Location: [facility];  Service: Orthopedics;  Laterality: Left;  CLOSED REDUCTION OF LEFT COLLES FRACTURE WITH SHORT ARM CAST  . HARDWARE REMOVAL Left 05/05/2014   Procedure: HARDWARE REMOVAL LEFT HIP;  Surgeon: Latanya Maudlin, MD;  Location: [facility];  Service: Orthopedics;  Laterality: Left;  REMOVAL BIOMET 6.5-8.0 CANNULATED SCREW  . HIP ARTHROPLASTY Left 05/05/2014   Procedure: ARTHROPLASTY OPEN REDUCTION INTERNAL FIXATION LEFT HIP AND REMOVAL OF TWO CANNULATED SCREW;  Surgeon: Latanya Maudlin, MD;  Location: [facility];  Service: Orthopedics;  Laterality: Left;  . HIP PINNING,CANNULATED  07/16/2011   Procedure: CANNULATED HIP PINNING;  Surgeon: Gearlean Alf, MD;  Location: [facility];  Service: Orthopedics;  Laterality: Left;  . HIP PINNING,CANNULATED Left 04/09/2012   Procedure: CANNULATED HIP PINNING AND HARDWARE REVISION;  Surgeon: Gearlean Alf, MD;  Location: [facility];  Service: Orthopedics;  Laterality: Left;  . I&D  EXTREMITY Left 11/16/2014   Procedure:  DEBRIDEMENT OF LEFT FOOT POSSIBLE APPLICATION OF INTEGRIA AND VAC ;  Surgeon: Irene Limbo, MD;  Location: [facility];  Service: Plastics;  Laterality: Left;  . PERIPHERAL VASCULAR CATHETERIZATION N/A 11/15/2014   Procedure: Abdominal Aortogram;  Surgeon: Conrad Shreveport, MD;  Location: [facility];  Service: Cardiovascular;  Laterality: N/A;  . PERIPHERAL VASCULAR CATHETERIZATION  11/15/2014   Procedure: Lower Extremity Angiography;  Surgeon: Conrad [place], MD;  Location: [facility];  Service: Cardiovascular;;  . PERIPHERAL VASCULAR CATHETERIZATION Left 11/15/2014   Procedure: Peripheral Vascular Intervention;  Surgeon: Conrad Waggaman, MD;  Location: [facility];  Service: Cardiovascular;  Laterality: Left;  popliteal artery stenting    Family History  Problem Relation Age of Onset  . Diabetes Mother   . Hypertension Mother   . Vision loss Mother   . Heart disease Mother   . Hypertension Father   . Thyroid disease  Neg Hx     Social History:  reports that she has been smoking e-cigarettes and cigarettes. She has a 17.50 pack-year smoking history. She has never used smokeless tobacco. She reports that she does not drink alcohol or use drugs.    Review of Systems   RENAL dysfunction: Normal but potassium is high, she has been told to stop potassium supplements after the lab was done  Lab Results  Component Value Date   CREATININE 1.14 07/29/2017   BUN 29 (H) 07/29/2017   NA 140 07/29/2017   K 5.4 (H) 07/29/2017   CL 101 07/29/2017   CO2 32 07/29/2017     Lipid history: Has been treated with  Fenofibrate and Crestor by PCP However LDL was high on the previous visit    Lab Results  Component Value Date   CHOL 250 (H) 05/21/2017   HDL 73 05/21/2017   LDLCALC 147 (H) 05/21/2017   TRIG 163 (H) 05/21/2017   CHOLHDL 3.4 05/21/2017          Currently on high-dose Lyrica  Hypertension: Currently not on treatment except  Lasix, followed by PCP She thinks her blood pressure was high today because of being upset  BP Readings from Last 3 Encounters:  08/13/17 (!) 150/98  05/29/17 128/74  05/21/17 122/80     Physical Examination:  BP (!) 150/98 (BP Location: Right Arm, Patient Position: Sitting, Cuff Size: Large)   Pulse 93   Temp 98.6 F (37 C) (Oral)   Ht 5' 10"  (1.778 m)   LMP 11/16/2010   SpO2 95%   BMI 34.09 kg/m       ASSESSMENT:  Diabetes type 2, uncontrolled    See history of present illness for detailed discussion of current diabetes management, blood sugar patterns and problems identified   A1c is 8.3 recently  Currently on a regimen of Toujeo twice daily and Humulin R U-500 insulin twice a day and Victoza  She has mostly fairly good fasting blood sugar but these are fluctuating and likely to be related to her diet and snacks late in the evening Also not clear if she is taking her insulin consistently She is not measuring the Humulin R U-500 insulin as she thinks she is taking 4 units but in actuality probably taking 40 units as shown on the demonstration pen in the office today Also since recently she has not checked readings after meals not clear if she is taking appropriate doses Also not adjusting the doses based on her meal intake For the last few days her morning sugars are getting periodically low possibly from decreased intake at night  Occasionally may forget to take her insulin when eating out with readings as high as 380 Unable to check her weight today    PLAN:   She will need to adjust her insulin at mealtimes 5-10 units based on meal size and carbohydrate intake and showed her how to do this on the demonstration pen of the [id number] Discussed uncertain duration of action of U-500 insulin She may also need 10 to 15 units before large snacks at night but currently not having consistently high readings late at night Since she is starting to get low morning readings without  overnight hypoglycemia she will reduce her Toujeo by 10 units in the evening Reminded her to check blood sugars at least 2 times a day at various times especially after supper No change in Victoza Consider follow-up with diabetes educator or dietitian  For her hypertension  she needs to follow-up with PCP since she appears to have an isolated high reading today Also will recheck her potassium today which was high possibly from potassium supplements  Counseling time on subjects discussed in assessment and plan sections is over 50% of today's 25 minute visit   Patient Instructions  Check blood sugars on waking up 5/7 days   Also check blood sugars about 2 hours after a meal and do this after different meals by rotation  Recommended blood sugar levels on waking up is 90-130 and about 2 hours after meal is 130-160  Please bring your blood sugar monitor to each visit, thank you  More after lunch, supper  REDUCE Toujeo to 50 units in the evening continue 60 in the morning   take 40 AT average meals and 50 AT large meals at lunchtime   ADDENDUM: Her creatinine is higher at 1.6 likely from overdiuresis, how to cut Lasix in half and follow-up with PCP Potassium normal  [name] 08/13/2017, 9:11 AM   Note: This office note was prepared with Dragon voice recognition system technology. Any transcriptional errors that result from this process are unintentional.

## 2017-08-14 DIAGNOSIS — Z79899 Other long term (current) drug therapy: Secondary | ICD-10-CM | POA: Diagnosis not present

## 2017-08-14 DIAGNOSIS — Z5181 Encounter for therapeutic drug level monitoring: Secondary | ICD-10-CM | POA: Diagnosis not present

## 2017-08-20 ENCOUNTER — Ambulatory Visit (INDEPENDENT_AMBULATORY_CARE_PROVIDER_SITE_OTHER): Payer: Medicare HMO | Admitting: Orthopaedic Surgery

## 2017-08-20 ENCOUNTER — Encounter (INDEPENDENT_AMBULATORY_CARE_PROVIDER_SITE_OTHER): Payer: Self-pay | Admitting: Orthopaedic Surgery

## 2017-08-20 VITALS — Ht 70.0 in | Wt 237.0 lb

## 2017-08-20 DIAGNOSIS — Z89512 Acquired absence of left leg below knee: Secondary | ICD-10-CM | POA: Diagnosis not present

## 2017-08-20 NOTE — Progress Notes (Signed)
Office Visit Note   Patient: [name]           Date of Birth: December 08, 1965           MRN: [id number] Visit Date: 08/20/2017              Requested by: [name], PA-C [place], [place] [address] PCP: [name], PA-C   Assessment & Plan: Visit Diagnoses:  1. Hx of BKA, left ([place])     Plan: Impression is painful left BKA stump.  I think the main issue is a poor fitting prosthetic socket.  She has 4 thick prosthetic socks today and this is still too loose.  I given her prescription for new prosthetic socket.  I think she would benefit from outpatient physical therapy for strengthening immobilization balance.  Follow-up as needed.  Follow-Up Instructions: Return if symptoms worsen or fail to improve.   Orders:  No orders of the defined types were placed in this encounter.  No orders of the defined types were placed in this encounter.     Procedures: No procedures performed   Clinical Data: No additional findings.   Subjective: Chief Complaint  Patient presents with  . Left Leg - Pain    Hx BKA    [name] is a 52 year old female who is status post left below the knee amputation for nonhealing ulcers.  She comes in today with recent discomfort related to her stump and her.  She states that it is tender at the distal end of the amputation stump.  She denies any wound problems or any constitutional symptoms.  She states that she is having to wear a lot of prosthetic socks to fit the socket.   Review of Systems  Constitutional: Negative.   HENT: Negative.   Eyes: Negative.   Respiratory: Negative.   Cardiovascular: Negative.   Endocrine: Negative.   Musculoskeletal: Negative.   Neurological: Negative.   Hematological: Negative.   Psychiatric/Behavioral: Negative.   All other systems reviewed and are negative.    Objective: Vital Signs: Ht 5' 10" (1.778 m)   Wt 237 lb (107.5 kg)   LMP 11/16/2010   BMI 34.01 kg/m    Physical Exam  Constitutional: She is oriented to person, place, and time. She appears well-developed and well-nourished.  HENT:  Head: Normocephalic and atraumatic.  Eyes: EOM are normal.  Neck: Neck supple.  Pulmonary/Chest: Effort normal.  Abdominal: Soft.  Neurological: She is alert and oriented to person, place, and time.  Skin: Skin is warm. Capillary refill takes less than 2 seconds.  Psychiatric: She has a normal mood and affect. Her behavior is normal. Judgment and thought content normal.  Nursing note and vitals reviewed.   Ortho Exam Left lower extremity exam shows a fully healed surgical scar.  She does endorse some mild discomfort with direct palpation at the osteotomy site.  There is no evidence of skin compromise. Specialty Comments:  No specialty comments available.  Imaging: No results found.   PMFS History: Patient Active Problem List   Diagnosis Date Noted  . DKA (diabetic ketoacidoses) ([place]) 03/02/2017  . COPD exacerbation ([place]) 02/23/2017  . Acute on chronic respiratory failure with hypoxia and hypercapnia ([place]) 08/19/2016  . Leukocytosis 08/19/2016  . CKD (chronic kidney disease), stage III ([place]) 08/19/2016  . Altered mental status 08/19/2016  . Asthma exacerbation 06/18/2016  . Insomnia 06/12/2016  . Difficult intravenous access   . Acute on chronic respiratory failure with hypoxia ([place]) 05/19/2016  .  NSTEMI (non-ST elevated myocardial infarction) ([place]) 05/19/2016  . Acute metabolic encephalopathy 87/68/1157  . Hyperlipidemia   . Class 3 obesity due to excess calories with serious comorbidity and body mass index (BMI) of 40.0 to 44.9 in adult   . Obesity hypoventilation syndrome ([place])   . Diastolic dysfunction 26/20/3559  . Diabetic neuropathy ([place]) 11/10/2014  . Fracture of distal femur ([place]) 07/08/2014  . Osteoporosis 07/08/2014  . Femur fracture, left ([place]) 07/08/2014  . Colles' fracture of left radius   . Left hip pain 05/03/2014  .  Perimenopausal symptoms 02/13/2012  . Hyponatremia 12/29/2011  . Asthma exacerbation attacks 12/28/2011  . Bronchitis 12/28/2011  . Repeated falls 07/30/2011  . Hip fracture, left ([place]) 07/16/2011  . Femoral neck fracture ([place]) 07/16/2011  . Tobacco abuse 04/14/2010  . Dyslipidemia 12/23/2008  . COPD with acute bronchitis ([place]) 11/29/2008  . THRUSH 05/20/2008  . Human immunodeficiency virus (HIV) disease ([place]) 04/19/2008  . Diabetes mellitus type 2 with complications, uncontrolled ([place]) 04/19/2008  . Mood disorder ([place]) 04/19/2008  . Hereditary and idiopathic peripheral neuropathy 04/19/2008  . Essential hypertension 04/19/2008   Past Medical History:  Diagnosis Date  . Acute metabolic encephalopathy 7/41/6384  . Acute on chronic respiratory failure with hypoxia ([place]) 05/19/2016  . Anxiety   . ARF (acute renal failure) ([place]) 05/03/2014  . Arthritis    LEFT HIP  . Asthma    HOSPITALIZED WITH EXCERBATION OF ASTHMA - AND BRONCHITIS AND THE FLU DEC 2013  . Asthma exacerbation attacks 12/28/2011  . Bronchitis 12/28/2011  . CAP (community acquired pneumonia) 07/25/2012  . Chronic kidney disease (CKD), stage IV (severe) ([place]) 11/10/2014  . Class 3 obesity due to excess calories with serious comorbidity and body mass index (BMI) of 40.0 to 44.9 in adult   . Colles' fracture of left radius   . Depression   . Diabetes mellitus    ON INSULIN AND ORAL MEDICATIONS  . Diabetes mellitus type 2 with complications, uncontrolled ([place]) 04/19/2008   Qualifier: Diagnosis of  By: Tomma Lightning MD, Claiborne Billings     . Diabetic foot infection ([place]) 11/09/2014  . Diabetic ketoacidosis without coma associated with type 2 diabetes mellitus ([place])   . Diabetic neuropathy ([place]) 11/10/2014  . Diarrhea 08/30/2008   Qualifier: Diagnosis of  By: Tomma Lightning MD, Claiborne Billings    . Diastolic dysfunction 53/06/4678  . Difficult intravenous access   . DKA, type 2 ([place]) 04/04/2016  . Dyslipidemia 12/23/2008   Qualifier: Diagnosis of  By: Tomma Lightning  MD, Claiborne Billings    . Essential hypertension 04/19/2008   Qualifier: Diagnosis of  By: Tomma Lightning MD, Claiborne Billings    . FATIGUE 07/12/2008   Qualifier: Diagnosis of  By: Tomma Lightning MD, Claiborne Billings    . Femoral neck fracture ([place]) 07/16/2011  . Femur fracture, left ([place]) 07/08/2014  . Fever   . Fracture of distal femur ([place]) 07/08/2014  . GERD (gastroesophageal reflux disease)   . Hereditary and idiopathic peripheral neuropathy 04/19/2008   Qualifier: Diagnosis of  By: Tomma Lightning MD, Claiborne Billings    . Hip fracture, left ([place]) 07/16/2011  . Hip pain   . HIV infection ([place]) 2000  . Human immunodeficiency virus (HIV) disease ([place]) 04/19/2008   [id number] +   . Hyperlipidemia   . Hypertension   . Hyponatremia 12/29/2011  . Influenza A 12/29/2011  . Influenza B   . Insomnia 06/12/2016  . Left hip pain 05/03/2014  . Mood disorder ([place]) 04/19/2008   Qualifier: Diagnosis of  By: Tomma Lightning MD, Claiborne Billings    .  Neuropathy    NEUROPATHY HANDS AND FEET  . NSTEMI (non-ST elevated myocardial infarction) ([place]) 05/19/2016  . Obesity hypoventilation syndrome ([place])   . Osteoporosis 07/08/2014  . Pain    SEVERE PAIN LEFT HIP - HX OF LEFT HIP PINNING JULY 2013  . Perimenopausal symptoms 02/13/2012   LMP around 2011. On estrace and provera since around 2012 for hot flashes, mood swings. Estrace 2 mg daily, Provera 2.5 mg for 5 days each month.   . Repeated falls 07/30/2011  . Sepsis ([place]) 04/04/2016  . Shortness of breath    ALLERGIES ARE "ACTING UP"  . SOB (shortness of breath)   . THRUSH 05/20/2008   Qualifier: Diagnosis of  By: Tomma Lightning MD, Claiborne Billings      . Tobacco use disorder 04/14/2010    Family History  Problem Relation Age of Onset  . Diabetes Mother   . Hypertension Mother   . Vision loss Mother   . Heart disease Mother   . Hypertension Father   . Thyroid disease Neg Hx     Past Surgical History:  Procedure Laterality Date  . AMPUTATION Left 11/18/2014   Procedure: LEFT BELOW KNEE AMPUTATION;  Surgeon: Leandrew Koyanagi, MD;  Location: [facility];   Service: Orthopedics;  Laterality: Left;  . CAST APPLICATION Left 04/08/8889   Procedure: CAST APPLICATION (FIBERGLASS);  Surgeon: Latanya Maudlin, MD;  Location: [facility];  Service: Orthopedics;  Laterality: Left;  CLOSED REDUCTION OF LEFT COLLES FRACTURE WITH SHORT ARM CAST  . HARDWARE REMOVAL Left 05/05/2014   Procedure: HARDWARE REMOVAL LEFT HIP;  Surgeon: Latanya Maudlin, MD;  Location: [facility];  Service: Orthopedics;  Laterality: Left;  REMOVAL BIOMET 6.5-8.0 CANNULATED SCREW  . HIP ARTHROPLASTY Left 05/05/2014   Procedure: ARTHROPLASTY OPEN REDUCTION INTERNAL FIXATION LEFT HIP AND REMOVAL OF TWO CANNULATED SCREW;  Surgeon: Latanya Maudlin, MD;  Location: [facility];  Service: Orthopedics;  Laterality: Left;  . HIP PINNING,CANNULATED  07/16/2011   Procedure: CANNULATED HIP PINNING;  Surgeon: Gearlean Alf, MD;  Location: [facility];  Service: Orthopedics;  Laterality: Left;  . HIP PINNING,CANNULATED Left 04/09/2012   Procedure: CANNULATED HIP PINNING AND HARDWARE REVISION;  Surgeon: Gearlean Alf, MD;  Location: [facility];  Service: Orthopedics;  Laterality: Left;  . I&D EXTREMITY Left 11/16/2014   Procedure:  DEBRIDEMENT OF LEFT FOOT POSSIBLE APPLICATION OF INTEGRIA AND VAC ;  Surgeon: Irene Limbo, MD;  Location: [facility];  Service: Plastics;  Laterality: Left;  . PERIPHERAL VASCULAR CATHETERIZATION N/A 11/15/2014   Procedure: Abdominal Aortogram;  Surgeon: Conrad Mappsburg, MD;  Location: [facility];  Service: Cardiovascular;  Laterality: N/A;  . PERIPHERAL VASCULAR CATHETERIZATION  11/15/2014   Procedure: Lower Extremity Angiography;  Surgeon: Conrad Waldwick, MD;  Location: [facility];  Service: Cardiovascular;;  . PERIPHERAL VASCULAR CATHETERIZATION Left 11/15/2014   Procedure: Peripheral Vascular Intervention;  Surgeon: Conrad Frankfort, MD;  Location: [facility];  Service: Cardiovascular;  Laterality: Left;  popliteal artery stenting   Social History   Occupational History  . Not on file   Tobacco Use  . Smoking status: Current Every Day Smoker    Packs/day: 0.50    Years: 35.00    Pack years: 17.50    Types: E-cigarettes, Cigarettes  . Smokeless tobacco: Never Used  Substance and Sexual Activity  . Alcohol use: No    Alcohol/week: 0.0 standard drinks    Comment: no drink since 2008  . Drug use: No  Comment: hx of crack/cocaine, clean 8 years  . Sexual activity: Never    Partners: Male

## 2017-08-21 DIAGNOSIS — J45909 Unspecified asthma, uncomplicated: Secondary | ICD-10-CM | POA: Diagnosis not present

## 2017-08-21 DIAGNOSIS — N184 Chronic kidney disease, stage 4 (severe): Secondary | ICD-10-CM | POA: Diagnosis not present

## 2017-08-21 DIAGNOSIS — M6281 Muscle weakness (generalized): Secondary | ICD-10-CM | POA: Diagnosis not present

## 2017-08-21 DIAGNOSIS — M545 Low back pain: Secondary | ICD-10-CM | POA: Diagnosis not present

## 2017-08-21 DIAGNOSIS — J45901 Unspecified asthma with (acute) exacerbation: Secondary | ICD-10-CM | POA: Diagnosis not present

## 2017-08-21 DIAGNOSIS — J449 Chronic obstructive pulmonary disease, unspecified: Secondary | ICD-10-CM | POA: Diagnosis not present

## 2017-08-21 DIAGNOSIS — S72002D Fracture of unspecified part of neck of left femur, subsequent encounter for closed fracture with routine healing: Secondary | ICD-10-CM | POA: Diagnosis not present

## 2017-08-25 ENCOUNTER — Encounter (HOSPITAL_COMMUNITY): Payer: Self-pay

## 2017-08-25 ENCOUNTER — Other Ambulatory Visit: Payer: Self-pay

## 2017-08-25 ENCOUNTER — Emergency Department (HOSPITAL_COMMUNITY): Payer: Medicare HMO

## 2017-08-25 ENCOUNTER — Emergency Department (HOSPITAL_COMMUNITY)
Admission: EM | Admit: 2017-08-25 | Discharge: 2017-08-26 | Disposition: A | Discharge status: Home / Self Care | Payer: Medicare HMO | Source: Home / Self Care | Attending: Emergency Medicine | Admitting: Emergency Medicine

## 2017-08-25 DIAGNOSIS — M2550 Pain in unspecified joint: Secondary | ICD-10-CM | POA: Diagnosis not present

## 2017-08-25 DIAGNOSIS — M5489 Other dorsalgia: Secondary | ICD-10-CM | POA: Diagnosis not present

## 2017-08-25 DIAGNOSIS — N3000 Acute cystitis without hematuria: Secondary | ICD-10-CM | POA: Diagnosis not present

## 2017-08-25 DIAGNOSIS — B20 Human immunodeficiency virus [HIV] disease: Secondary | ICD-10-CM | POA: Diagnosis not present

## 2017-08-25 DIAGNOSIS — Z720 Tobacco use: Secondary | ICD-10-CM | POA: Diagnosis not present

## 2017-08-25 DIAGNOSIS — R0781 Pleurodynia: Secondary | ICD-10-CM | POA: Diagnosis not present

## 2017-08-25 DIAGNOSIS — N3091 Cystitis, unspecified with hematuria: Secondary | ICD-10-CM | POA: Diagnosis not present

## 2017-08-25 DIAGNOSIS — R2689 Other abnormalities of gait and mobility: Secondary | ICD-10-CM | POA: Diagnosis not present

## 2017-08-25 DIAGNOSIS — R062 Wheezing: Secondary | ICD-10-CM | POA: Diagnosis not present

## 2017-08-25 DIAGNOSIS — Z7401 Bed confinement status: Secondary | ICD-10-CM | POA: Diagnosis not present

## 2017-08-25 DIAGNOSIS — E1142 Type 2 diabetes mellitus with diabetic polyneuropathy: Secondary | ICD-10-CM | POA: Diagnosis not present

## 2017-08-25 DIAGNOSIS — R0989 Other specified symptoms and signs involving the circulatory and respiratory systems: Secondary | ICD-10-CM | POA: Diagnosis not present

## 2017-08-25 DIAGNOSIS — R0600 Dyspnea, unspecified: Secondary | ICD-10-CM | POA: Diagnosis not present

## 2017-08-25 DIAGNOSIS — E118 Type 2 diabetes mellitus with unspecified complications: Secondary | ICD-10-CM | POA: Diagnosis not present

## 2017-08-25 DIAGNOSIS — R0602 Shortness of breath: Secondary | ICD-10-CM | POA: Diagnosis not present

## 2017-08-25 DIAGNOSIS — S79911A Unspecified injury of right hip, initial encounter: Secondary | ICD-10-CM | POA: Diagnosis not present

## 2017-08-25 DIAGNOSIS — R4182 Altered mental status, unspecified: Secondary | ICD-10-CM | POA: Diagnosis not present

## 2017-08-25 DIAGNOSIS — R945 Abnormal results of liver function studies: Secondary | ICD-10-CM | POA: Diagnosis not present

## 2017-08-25 DIAGNOSIS — J449 Chronic obstructive pulmonary disease, unspecified: Secondary | ICD-10-CM | POA: Diagnosis not present

## 2017-08-25 DIAGNOSIS — M25551 Pain in right hip: Secondary | ICD-10-CM | POA: Diagnosis not present

## 2017-08-25 DIAGNOSIS — R0689 Other abnormalities of breathing: Secondary | ICD-10-CM | POA: Diagnosis not present

## 2017-08-25 DIAGNOSIS — D72829 Elevated white blood cell count, unspecified: Secondary | ICD-10-CM | POA: Diagnosis not present

## 2017-08-25 DIAGNOSIS — S0990XA Unspecified injury of head, initial encounter: Secondary | ICD-10-CM | POA: Diagnosis not present

## 2017-08-25 DIAGNOSIS — G92 Toxic encephalopathy: Secondary | ICD-10-CM | POA: Diagnosis not present

## 2017-08-25 DIAGNOSIS — W19XXXA Unspecified fall, initial encounter: Secondary | ICD-10-CM | POA: Diagnosis not present

## 2017-08-25 DIAGNOSIS — J441 Chronic obstructive pulmonary disease with (acute) exacerbation: Secondary | ICD-10-CM

## 2017-08-25 DIAGNOSIS — M6282 Rhabdomyolysis: Secondary | ICD-10-CM | POA: Diagnosis not present

## 2017-08-25 DIAGNOSIS — M25561 Pain in right knee: Secondary | ICD-10-CM | POA: Diagnosis not present

## 2017-08-25 DIAGNOSIS — G9340 Encephalopathy, unspecified: Secondary | ICD-10-CM | POA: Diagnosis not present

## 2017-08-25 DIAGNOSIS — F329 Major depressive disorder, single episode, unspecified: Secondary | ICD-10-CM | POA: Diagnosis not present

## 2017-08-25 DIAGNOSIS — R0902 Hypoxemia: Secondary | ICD-10-CM | POA: Diagnosis not present

## 2017-08-25 DIAGNOSIS — S199XXA Unspecified injury of neck, initial encounter: Secondary | ICD-10-CM | POA: Diagnosis not present

## 2017-08-25 DIAGNOSIS — R488 Other symbolic dysfunctions: Secondary | ICD-10-CM | POA: Diagnosis not present

## 2017-08-25 DIAGNOSIS — E871 Hypo-osmolality and hyponatremia: Secondary | ICD-10-CM | POA: Diagnosis not present

## 2017-08-25 DIAGNOSIS — E1165 Type 2 diabetes mellitus with hyperglycemia: Secondary | ICD-10-CM | POA: Diagnosis not present

## 2017-08-25 DIAGNOSIS — E662 Morbid (severe) obesity with alveolar hypoventilation: Secondary | ICD-10-CM | POA: Diagnosis not present

## 2017-08-25 DIAGNOSIS — R55 Syncope and collapse: Secondary | ICD-10-CM | POA: Diagnosis not present

## 2017-08-25 DIAGNOSIS — B964 Proteus (mirabilis) (morganii) as the cause of diseases classified elsewhere: Secondary | ICD-10-CM | POA: Diagnosis not present

## 2017-08-25 DIAGNOSIS — I130 Hypertensive heart and chronic kidney disease with heart failure and stage 1 through stage 4 chronic kidney disease, or unspecified chronic kidney disease: Secondary | ICD-10-CM | POA: Diagnosis not present

## 2017-08-25 DIAGNOSIS — N183 Chronic kidney disease, stage 3 (moderate): Secondary | ICD-10-CM | POA: Diagnosis not present

## 2017-08-25 DIAGNOSIS — N189 Chronic kidney disease, unspecified: Secondary | ICD-10-CM | POA: Diagnosis not present

## 2017-08-25 DIAGNOSIS — E872 Acidosis: Secondary | ICD-10-CM | POA: Diagnosis not present

## 2017-08-25 DIAGNOSIS — N3090 Cystitis, unspecified without hematuria: Secondary | ICD-10-CM | POA: Diagnosis not present

## 2017-08-25 DIAGNOSIS — N179 Acute kidney failure, unspecified: Secondary | ICD-10-CM | POA: Diagnosis not present

## 2017-08-25 DIAGNOSIS — R4020 Unspecified coma: Secondary | ICD-10-CM | POA: Diagnosis not present

## 2017-08-25 DIAGNOSIS — Z6841 Body Mass Index (BMI) 40.0 and over, adult: Secondary | ICD-10-CM | POA: Diagnosis not present

## 2017-08-25 DIAGNOSIS — J440 Chronic obstructive pulmonary disease with acute lower respiratory infection: Secondary | ICD-10-CM | POA: Diagnosis not present

## 2017-08-25 DIAGNOSIS — J209 Acute bronchitis, unspecified: Secondary | ICD-10-CM | POA: Diagnosis not present

## 2017-08-25 DIAGNOSIS — E875 Hyperkalemia: Secondary | ICD-10-CM | POA: Diagnosis not present

## 2017-08-25 DIAGNOSIS — E870 Hyperosmolality and hypernatremia: Secondary | ICD-10-CM | POA: Diagnosis not present

## 2017-08-25 DIAGNOSIS — G894 Chronic pain syndrome: Secondary | ICD-10-CM | POA: Diagnosis not present

## 2017-08-25 DIAGNOSIS — E1122 Type 2 diabetes mellitus with diabetic chronic kidney disease: Secondary | ICD-10-CM | POA: Diagnosis not present

## 2017-08-25 DIAGNOSIS — E785 Hyperlipidemia, unspecified: Secondary | ICD-10-CM | POA: Diagnosis not present

## 2017-08-25 DIAGNOSIS — N390 Urinary tract infection, site not specified: Secondary | ICD-10-CM | POA: Diagnosis not present

## 2017-08-25 DIAGNOSIS — R404 Transient alteration of awareness: Secondary | ICD-10-CM | POA: Diagnosis not present

## 2017-08-25 DIAGNOSIS — M6281 Muscle weakness (generalized): Secondary | ICD-10-CM | POA: Diagnosis not present

## 2017-08-25 DIAGNOSIS — I5032 Chronic diastolic (congestive) heart failure: Secondary | ICD-10-CM | POA: Diagnosis not present

## 2017-08-25 DIAGNOSIS — I499 Cardiac arrhythmia, unspecified: Secondary | ICD-10-CM | POA: Diagnosis not present

## 2017-08-25 DIAGNOSIS — R296 Repeated falls: Secondary | ICD-10-CM | POA: Diagnosis not present

## 2017-08-25 DIAGNOSIS — F419 Anxiety disorder, unspecified: Secondary | ICD-10-CM | POA: Diagnosis not present

## 2017-08-25 DIAGNOSIS — S8991XA Unspecified injury of right lower leg, initial encounter: Secondary | ICD-10-CM | POA: Diagnosis not present

## 2017-08-25 DIAGNOSIS — I10 Essential (primary) hypertension: Secondary | ICD-10-CM | POA: Diagnosis not present

## 2017-08-25 DIAGNOSIS — E860 Dehydration: Secondary | ICD-10-CM | POA: Diagnosis not present

## 2017-08-25 DIAGNOSIS — R000 Tachycardia, unspecified: Secondary | ICD-10-CM | POA: Diagnosis not present

## 2017-08-25 DIAGNOSIS — E876 Hypokalemia: Secondary | ICD-10-CM | POA: Diagnosis not present

## 2017-08-25 DIAGNOSIS — R05 Cough: Secondary | ICD-10-CM | POA: Diagnosis not present

## 2017-08-25 LAB — CBC WITH DIFFERENTIAL/PLATELET
Basophils Absolute: 0 10*3/uL (ref 0.0–0.1)
Basophils Relative: 0 %
Eosinophils Absolute: 0.1 10*3/uL (ref 0.0–0.7)
Eosinophils Relative: 0 %
HCT: 44.3 % (ref 36.0–46.0)
Hemoglobin: 14.2 g/dL (ref 12.0–15.0)
Lymphocytes Relative: 11 %
Lymphs Abs: 1.6 10*3/uL (ref 0.7–4.0)
MCH: 29.4 pg (ref 26.0–34.0)
MCHC: 32.1 g/dL (ref 30.0–36.0)
MCV: 91.7 fL (ref 78.0–100.0)
Monocytes Absolute: 1 10*3/uL (ref 0.1–1.0)
Monocytes Relative: 7 %
Neutro Abs: 11.6 10*3/uL — ABNORMAL HIGH (ref 1.7–7.7)
Neutrophils Relative %: 82 %
Platelets: 250 10*3/uL (ref 150–400)
RBC: 4.83 MIL/uL (ref 3.87–5.11)
RDW: 19.1 % — ABNORMAL HIGH (ref 11.5–15.5)
WBC: 14.2 10*3/uL — ABNORMAL HIGH (ref 4.0–10.5)

## 2017-08-25 MED ORDER — PREDNISONE 20 MG PO TABS: 40.0000 mg | ORAL_TABLET | Freq: Every day | ORAL | 0 refills | Status: DC

## 2017-08-25 MED ORDER — IPRATROPIUM BROMIDE 0.02 % IN SOLN
0.5000 mg | Freq: Once | RESPIRATORY_TRACT | Status: AC
  Administered 2017-08-25: 0.5 mg via RESPIRATORY_TRACT
  Filled 2017-08-25: qty 2.5

## 2017-08-25 MED ORDER — ALBUTEROL (5 MG/ML) CONTINUOUS INHALATION SOLN
15.0000 mg/h | INHALATION_SOLUTION | RESPIRATORY_TRACT | Status: DC
  Administered 2017-08-25: 15 mg/h via RESPIRATORY_TRACT
  Filled 2017-08-25: qty 20

## 2017-08-25 NOTE — ED Notes (Signed)
Bed: RESB Expected date:  Expected time:  Means of arrival:  Comments: 52 yo F/SOB-Wheezing

## 2017-08-25 NOTE — ED Triage Notes (Signed)
Pt was brought by [facility] due to a sudden onset of SHOB at 7:00 pm this evening. Per EMS, wheezing heard in all lung fields. Per EMS, pt had cool extremities upon arrival. Per EMS, pt has had a non productive cough for the past few days. Pt has a hx of asthma and bronchitis. Per pt, pt is on 3 L of O2 at home.   20 G Left AC 5mg  albuterol x2 0.5mg  atrovent x2 125 Solumedrol

## 2017-08-25 NOTE — ED Notes (Signed)
Three unsuccessful attempts to draw labs.

## 2017-08-26 ENCOUNTER — Other Ambulatory Visit: Payer: Self-pay

## 2017-08-26 ENCOUNTER — Emergency Department (HOSPITAL_COMMUNITY): Payer: Medicare HMO

## 2017-08-26 ENCOUNTER — Encounter (HOSPITAL_COMMUNITY): Payer: Self-pay

## 2017-08-26 ENCOUNTER — Emergency Department (HOSPITAL_COMMUNITY)
Admission: EM | Admit: 2017-08-26 | Discharge: 2017-08-26 | Discharge status: Against Medical Advice | Payer: Medicare HMO | Source: Home / Self Care | Attending: Emergency Medicine | Admitting: Emergency Medicine

## 2017-08-26 ENCOUNTER — Observation Stay (HOSPITAL_BASED_OUTPATIENT_CLINIC_OR_DEPARTMENT_OTHER)
Admission: EM | Admit: 2017-08-26 | Discharge: 2017-08-27 | Disposition: A | Discharge status: Home / Self Care | Payer: Medicare HMO | Source: Home / Self Care | Attending: Emergency Medicine | Admitting: Emergency Medicine

## 2017-08-26 ENCOUNTER — Encounter (HOSPITAL_COMMUNITY): Payer: Self-pay | Admitting: Emergency Medicine

## 2017-08-26 DIAGNOSIS — Z888 Allergy status to other drugs, medicaments and biological substances status: Secondary | ICD-10-CM | POA: Insufficient documentation

## 2017-08-26 DIAGNOSIS — E118 Type 2 diabetes mellitus with unspecified complications: Secondary | ICD-10-CM | POA: Diagnosis not present

## 2017-08-26 DIAGNOSIS — F17210 Nicotine dependence, cigarettes, uncomplicated: Secondary | ICD-10-CM | POA: Insufficient documentation

## 2017-08-26 DIAGNOSIS — J45909 Unspecified asthma, uncomplicated: Secondary | ICD-10-CM | POA: Insufficient documentation

## 2017-08-26 DIAGNOSIS — N183 Chronic kidney disease, stage 3 (moderate): Secondary | ICD-10-CM | POA: Insufficient documentation

## 2017-08-26 DIAGNOSIS — Z66 Do not resuscitate: Secondary | ICD-10-CM

## 2017-08-26 DIAGNOSIS — J441 Chronic obstructive pulmonary disease with (acute) exacerbation: Secondary | ICD-10-CM

## 2017-08-26 DIAGNOSIS — IMO0002 Reserved for concepts with insufficient information to code with codable children: Secondary | ICD-10-CM | POA: Diagnosis present

## 2017-08-26 DIAGNOSIS — Z7951 Long term (current) use of inhaled steroids: Secondary | ICD-10-CM | POA: Insufficient documentation

## 2017-08-26 DIAGNOSIS — E1122 Type 2 diabetes mellitus with diabetic chronic kidney disease: Secondary | ICD-10-CM

## 2017-08-26 DIAGNOSIS — E1142 Type 2 diabetes mellitus with diabetic polyneuropathy: Secondary | ICD-10-CM

## 2017-08-26 DIAGNOSIS — E119 Type 2 diabetes mellitus without complications: Secondary | ICD-10-CM | POA: Insufficient documentation

## 2017-08-26 DIAGNOSIS — I130 Hypertensive heart and chronic kidney disease with heart failure and stage 1 through stage 4 chronic kidney disease, or unspecified chronic kidney disease: Secondary | ICD-10-CM

## 2017-08-26 DIAGNOSIS — Z79899 Other long term (current) drug therapy: Secondary | ICD-10-CM

## 2017-08-26 DIAGNOSIS — Z8249 Family history of ischemic heart disease and other diseases of the circulatory system: Secondary | ICD-10-CM

## 2017-08-26 DIAGNOSIS — E785 Hyperlipidemia, unspecified: Secondary | ICD-10-CM | POA: Insufficient documentation

## 2017-08-26 DIAGNOSIS — K219 Gastro-esophageal reflux disease without esophagitis: Secondary | ICD-10-CM

## 2017-08-26 DIAGNOSIS — Z794 Long term (current) use of insulin: Secondary | ICD-10-CM

## 2017-08-26 DIAGNOSIS — I5032 Chronic diastolic (congestive) heart failure: Secondary | ICD-10-CM | POA: Insufficient documentation

## 2017-08-26 DIAGNOSIS — Z89512 Acquired absence of left leg below knee: Secondary | ICD-10-CM | POA: Insufficient documentation

## 2017-08-26 DIAGNOSIS — I10 Essential (primary) hypertension: Secondary | ICD-10-CM

## 2017-08-26 DIAGNOSIS — E1165 Type 2 diabetes mellitus with hyperglycemia: Secondary | ICD-10-CM | POA: Insufficient documentation

## 2017-08-26 DIAGNOSIS — I517 Cardiomegaly: Secondary | ICD-10-CM | POA: Insufficient documentation

## 2017-08-26 DIAGNOSIS — I110 Hypertensive heart disease with heart failure: Secondary | ICD-10-CM

## 2017-08-26 DIAGNOSIS — B20 Human immunodeficiency virus [HIV] disease: Secondary | ICD-10-CM

## 2017-08-26 DIAGNOSIS — J449 Chronic obstructive pulmonary disease, unspecified: Secondary | ICD-10-CM

## 2017-08-26 LAB — COMPREHENSIVE METABOLIC PANEL
ALBUMIN: 3.2 g/dL — AB (ref 3.5–5.0)
ALT: 20 U/L (ref 0–44)
AST: 18 U/L (ref 15–41)
Alkaline Phosphatase: 60 U/L (ref 38–126)
Anion gap: 16 — ABNORMAL HIGH (ref 5–15)
BILIRUBIN TOTAL: 0.7 mg/dL (ref 0.3–1.2)
BUN: 26 mg/dL — AB (ref 6–20)
CHLORIDE: 99 mmol/L (ref 98–111)
CO2: 26 mmol/L (ref 22–32)
Calcium: 9.6 mg/dL (ref 8.9–10.3)
Creatinine, Ser: 1.27 mg/dL — ABNORMAL HIGH (ref 0.44–1.00)
GFR calc Af Amer: 55 mL/min — ABNORMAL LOW (ref >60)
GFR calc non Af Amer: 47 mL/min — ABNORMAL LOW (ref >60)
Glucose, Bld: 449 mg/dL — ABNORMAL HIGH (ref 70–99)
POTASSIUM: 4.7 mmol/L (ref 3.5–5.1)
Sodium: 141 mmol/L (ref 135–145)
TOTAL PROTEIN: 6.3 g/dL — AB (ref 6.5–8.1)

## 2017-08-26 LAB — I-STAT VENOUS BLOOD GAS, ED
Acid-Base Excess: 6 mmol/L — ABNORMAL HIGH (ref 0.0–2.0)
Acid-Base Excess: 10 mmol/L — ABNORMAL HIGH (ref 0.0–2.0)
BICARBONATE: 36.3 mmol/L — AB (ref 20.0–28.0)
Bicarbonate: 30.8 mmol/L — ABNORMAL HIGH (ref 20.0–28.0)
O2 Saturation: 89 %
O2 Saturation: 35 %
PCO2 VEN: 44.9 mmHg (ref 44.0–60.0)
PCO2 VEN: 57.2 mmHg (ref 44.0–60.0)
PH VEN: 7.445 — AB (ref 7.250–7.430)
PH VEN: 7.411 (ref 7.250–7.430)
PO2 VEN: 55 mmHg — AB (ref 32.0–45.0)
PO2 VEN: 22 mmHg — AB (ref 32.0–45.0)
TCO2: 32 mmol/L (ref 22–32)
TCO2: 38 mmol/L — ABNORMAL HIGH (ref 22–32)

## 2017-08-26 LAB — BLOOD GAS, VENOUS
ACID-BASE DEFICIT: 1.1 mmol/L (ref 0.0–2.0)
Bicarbonate: 22 mmol/L (ref 20.0–28.0)
O2 SAT: 78.6 %
Patient temperature: 98.6
pCO2, Ven: 33.4 mmHg — ABNORMAL LOW (ref 44.0–60.0)
pH, Ven: 7.433 — ABNORMAL HIGH (ref 7.250–7.430)
pO2, Ven: 45 mmHg (ref 32.0–45.0)

## 2017-08-26 LAB — CBC WITH DIFFERENTIAL/PLATELET
Abs Immature Granulocytes: 0.1 10*3/uL (ref 0.0–0.1)
BASOS ABS: 0 10*3/uL (ref 0.0–0.1)
Basophils Relative: 0 %
EOS ABS: 0 10*3/uL (ref 0.0–0.7)
Eosinophils Relative: 0 %
HEMATOCRIT: 45.4 % (ref 36.0–46.0)
Hemoglobin: 13.9 g/dL (ref 12.0–15.0)
IMMATURE GRANULOCYTES: 1 %
LYMPHS ABS: 0.8 10*3/uL (ref 0.7–4.0)
LYMPHS PCT: 6 %
MCH: 28.1 pg (ref 26.0–34.0)
MCHC: 30.6 g/dL (ref 30.0–36.0)
MCV: 91.7 fL (ref 78.0–100.0)
Monocytes Absolute: 0.2 10*3/uL (ref 0.1–1.0)
Monocytes Relative: 2 %
NEUTROS PCT: 91 %
Neutro Abs: 12.3 10*3/uL — ABNORMAL HIGH (ref 1.7–7.7)
Platelets: 250 10*3/uL (ref 150–400)
RBC: 4.95 MIL/uL (ref 3.87–5.11)
RDW: 19.5 % — AB (ref 11.5–15.5)
WBC: 13.5 10*3/uL — AB (ref 4.0–10.5)

## 2017-08-26 LAB — I-STAT TROPONIN, ED
TROPONIN I, POC: 0.04 ng/mL (ref 0.00–0.08)
Troponin i, poc: 0.03 ng/mL (ref 0.00–0.08)
Troponin i, poc: 0.03 ng/mL (ref 0.00–0.08)

## 2017-08-26 LAB — I-STAT CG4 LACTIC ACID, ED
LACTIC ACID, VENOUS: 1.94 mmol/L — AB (ref 0.5–1.9)
Lactic Acid, Venous: 1.98 mmol/L — ABNORMAL HIGH (ref 0.5–1.9)

## 2017-08-26 LAB — BRAIN NATRIURETIC PEPTIDE: B NATRIURETIC PEPTIDE 5: 157 pg/mL — AB (ref 0.0–100.0)

## 2017-08-26 LAB — LIPASE, BLOOD: Lipase: 25 U/L (ref 11–51)

## 2017-08-26 MED ORDER — METHYLPREDNISOLONE SODIUM SUCC 125 MG IJ SOLR
80.0000 mg | Freq: Three times a day (TID) | INTRAMUSCULAR | Status: DC
  Administered 2017-08-26 – 2017-08-27 (×2): 80 mg via INTRAVENOUS
  Filled 2017-08-26 (×2): qty 2

## 2017-08-26 MED ORDER — MAGNESIUM SULFATE 2 GM/50ML IV SOLN
2.0000 g | Freq: Once | INTRAVENOUS | Status: AC
  Administered 2017-08-26: 2 g via INTRAVENOUS
  Filled 2017-08-26: qty 50

## 2017-08-26 MED ORDER — HYDROCODONE-ACETAMINOPHEN 10-325 MG PO TABS
1.0000 | ORAL_TABLET | Freq: Once | ORAL | Status: AC
  Administered 2017-08-26: 1 via ORAL
  Filled 2017-08-26: qty 1

## 2017-08-26 MED ORDER — UMECLIDINIUM BROMIDE 62.5 MCG/INH IN AEPB
1.0000 | INHALATION_SPRAY | Freq: Every day | RESPIRATORY_TRACT | Status: DC
  Filled 2017-08-26: qty 7

## 2017-08-26 MED ORDER — INSULIN ASPART 100 UNIT/ML ~~LOC~~ SOLN
0.0000 [IU] | Freq: Three times a day (TID) | SUBCUTANEOUS | Status: DC
  Administered 2017-08-27: 3 [IU] via SUBCUTANEOUS
  Administered 2017-08-27: 2 [IU] via SUBCUTANEOUS
  Filled 2017-08-26: qty 1

## 2017-08-26 MED ORDER — ACETAMINOPHEN 325 MG PO TABS
650.0000 mg | ORAL_TABLET | Freq: Four times a day (QID) | ORAL | Status: DC | PRN
  Administered 2017-08-27: 650 mg via ORAL
  Filled 2017-08-26: qty 2

## 2017-08-26 MED ORDER — HYDROCODONE-ACETAMINOPHEN 5-325 MG PO TABS
2.0000 | ORAL_TABLET | Freq: Once | ORAL | Status: AC
  Administered 2017-08-26: 2 via ORAL
  Filled 2017-08-26: qty 2

## 2017-08-26 MED ORDER — IPRATROPIUM-ALBUTEROL 0.5-2.5 (3) MG/3ML IN SOLN
3.0000 mL | Freq: Once | RESPIRATORY_TRACT | Status: AC
  Administered 2017-08-26: 3 mL via RESPIRATORY_TRACT
  Filled 2017-08-26: qty 3

## 2017-08-26 MED ORDER — METHYLPREDNISOLONE SODIUM SUCC 125 MG IJ SOLR
125.0000 mg | Freq: Once | INTRAMUSCULAR | Status: AC
  Administered 2017-08-26: 125 mg via INTRAVENOUS
  Filled 2017-08-26: qty 2

## 2017-08-26 MED ORDER — ALBUTEROL SULFATE (2.5 MG/3ML) 0.083% IN NEBU
2.5000 mg | INHALATION_SOLUTION | Freq: Once | RESPIRATORY_TRACT | Status: AC
  Administered 2017-08-26: 2.5 mg via RESPIRATORY_TRACT
  Filled 2017-08-26: qty 3

## 2017-08-26 MED ORDER — SODIUM CHLORIDE 0.9 % IV SOLN
500.0000 mg | Freq: Every day | INTRAVENOUS | Status: DC
  Administered 2017-08-26: 500 mg via INTRAVENOUS
  Filled 2017-08-26 (×2): qty 500

## 2017-08-26 MED ORDER — ALBUTEROL SULFATE (2.5 MG/3ML) 0.083% IN NEBU: 2.5000 mg | INHALATION_SOLUTION | Freq: Four times a day (QID) | RESPIRATORY_TRACT | Status: DC | PRN

## 2017-08-26 MED ORDER — SODIUM CHLORIDE 0.9% FLUSH: 3.0000 mL | INTRAVENOUS | Status: DC | PRN

## 2017-08-26 MED ORDER — ENOXAPARIN SODIUM 40 MG/0.4ML ~~LOC~~ SOLN: 40.0000 mg | SUBCUTANEOUS | Status: DC

## 2017-08-26 MED ORDER — INSULIN ASPART 100 UNIT/ML ~~LOC~~ SOLN
0.0000 [IU] | Freq: Every day | SUBCUTANEOUS | Status: DC
  Administered 2017-08-27: 5 [IU] via SUBCUTANEOUS

## 2017-08-26 MED ORDER — SODIUM CHLORIDE 0.9 % IV SOLN: 250.0000 mL | INTRAVENOUS | Status: DC | PRN

## 2017-08-26 MED ORDER — ALBUTEROL SULFATE (2.5 MG/3ML) 0.083% IN NEBU
2.5000 mg | INHALATION_SOLUTION | Freq: Four times a day (QID) | RESPIRATORY_TRACT | Status: DC
  Administered 2017-08-27 (×3): 2.5 mg via RESPIRATORY_TRACT
  Filled 2017-08-26 (×3): qty 3

## 2017-08-26 MED ORDER — ALBUTEROL (5 MG/ML) CONTINUOUS INHALATION SOLN
10.0000 mg/h | INHALATION_SOLUTION | RESPIRATORY_TRACT | Status: AC
  Administered 2017-08-26: 10 mg/h via RESPIRATORY_TRACT
  Filled 2017-08-26: qty 20

## 2017-08-26 MED ORDER — ACETAMINOPHEN 650 MG RE SUPP: 650.0000 mg | Freq: Four times a day (QID) | RECTAL | Status: DC | PRN

## 2017-08-26 MED ORDER — SODIUM CHLORIDE 0.9% FLUSH
3.0000 mL | Freq: Two times a day (BID) | INTRAVENOUS | Status: DC
  Administered 2017-08-26: 3 mL via INTRAVENOUS

## 2017-08-26 NOTE — ED Triage Notes (Signed)
Pt here from home with c/o sob , pt was just d/c'd from [facility] , pt received a script for prednisone but has not had it filled

## 2017-08-26 NOTE — ED Notes (Signed)
Pt leaving AMA. Paperwork discussed with pt. Pt understands instructions and to return if needed.

## 2017-08-26 NOTE — ED Notes (Signed)
Lab results Given to Dr. Sherry Ruffing.

## 2017-08-26 NOTE — ED Notes (Signed)
Pt has spilled part of resp tx on self, has wiggled to end of bed- remains short of breath.

## 2017-08-26 NOTE — ED Provider Notes (Signed)
[facility] DEPT Provider Note   CSN: [id number] Arrival date & time: 08/26/17  1918     History   Chief Complaint Chief Complaint  Patient presents with  . Shortness of Breath    HPI [name] is a 52 y.o. female.  The history is provided by the patient. No language interpreter was used.  Shortness of Breath    [name] is a 52 y.o. female who presents to the Emergency Department complaining of sob. Presents to the emergency department complaining of shortness of breath. She has a history of COPD and is on 3 L home oxygen at baseline. She endorses increased shortness of breath with cough and clear sputum production for the last three days. She denies any fevers, chest pain, leg swelling or pain. She does live at home alone and smokes about half a pack of cigarettes daily. She states that she is feeling more confused and more short of breath. Today she called [id number] difficulty breathing. Past Medical History:  Diagnosis Date  . Acute metabolic encephalopathy 06/07/3708  . Acute on chronic respiratory failure with hypoxia ([place]) 05/19/2016  . Anxiety   . ARF (acute renal failure) ([place]) 05/03/2014  . Arthritis    LEFT HIP  . Asthma    HOSPITALIZED WITH EXCERBATION OF ASTHMA - AND BRONCHITIS AND THE FLU DEC 2013  . Asthma exacerbation attacks 12/28/2011  . Bronchitis 12/28/2011  . CAP (community acquired pneumonia) 07/25/2012  . Chronic kidney disease (CKD), stage IV (severe) ([place]) 11/10/2014  . Class 3 obesity due to excess calories with serious comorbidity and body mass index (BMI) of 40.0 to 44.9 in adult   . Colles' fracture of left radius   . Depression   . Diabetes mellitus    ON INSULIN AND ORAL MEDICATIONS  . Diabetes mellitus type 2 with complications, uncontrolled ([employer]) 04/19/2008   Qualifier: Diagnosis of  By: Tomma Lightning MD, Claiborne Billings     . Diabetic foot infection ([place]) 11/09/2014  . Diabetic ketoacidosis without coma associated with  type 2 diabetes mellitus ([place])   . Diabetic neuropathy ([place]) 11/10/2014  . Diarrhea 08/30/2008   Qualifier: Diagnosis of  By: Tomma Lightning MD, Claiborne Billings    . Diastolic dysfunction 62/06/9483  . Difficult intravenous access   . DKA, type 2 ([place]) 04/04/2016  . Dyslipidemia 12/23/2008   Qualifier: Diagnosis of  By: Tomma Lightning MD, Claiborne Billings    . Essential hypertension 04/19/2008   Qualifier: Diagnosis of  By: Tomma Lightning MD, Claiborne Billings    . FATIGUE 07/12/2008   Qualifier: Diagnosis of  By: Tomma Lightning MD, Claiborne Billings    . Femoral neck fracture ([place]) 07/16/2011  . Femur fracture, left ([place]) 07/08/2014  . Fever   . Fracture of distal femur ([place]) 07/08/2014  . GERD (gastroesophageal reflux disease)   . Hereditary and idiopathic peripheral neuropathy 04/19/2008   Qualifier: Diagnosis of  By: Tomma Lightning MD, Claiborne Billings    . Hip fracture, left ([place]) 07/16/2011  . Hip pain   . HIV infection ([place]) 2000  . Human immunodeficiency virus (HIV) disease ([place]) 04/19/2008   [id number] +   . Hyperlipidemia   . Hypertension   . Hyponatremia 12/29/2011  . Influenza A 12/29/2011  . Influenza B   . Insomnia 06/12/2016  . Left hip pain 05/03/2014  . Mood disorder ([place]) 04/19/2008   Qualifier: Diagnosis of  By: Tomma Lightning MD, Claiborne Billings    . Neuropathy    NEUROPATHY HANDS AND FEET  . NSTEMI (non-ST elevated myocardial infarction) ([place]) 05/19/2016  .  Obesity hypoventilation syndrome ([place])   . Osteoporosis 07/08/2014  . Pain    SEVERE PAIN LEFT HIP - HX OF LEFT HIP PINNING JULY 2013  . Perimenopausal symptoms 02/13/2012   LMP around 2011. On estrace and provera since around 2012 for hot flashes, mood swings. Estrace 2 mg daily, Provera 2.5 mg for 5 days each month.   . Repeated falls 07/30/2011  . Sepsis ([place]) 04/04/2016  . Shortness of breath    ALLERGIES ARE "ACTING UP"  . SOB (shortness of breath)   . THRUSH 05/20/2008   Qualifier: Diagnosis of  By: Tomma Lightning MD, Claiborne Billings      . Tobacco use disorder 04/14/2010    Patient Active Problem List   Diagnosis Date Noted  . DKA  (diabetic ketoacidoses) ([place]) 03/02/2017  . COPD exacerbation ([place]) 02/23/2017  . Acute on chronic respiratory failure with hypoxia and hypercapnia ([place]) 08/19/2016  . Leukocytosis 08/19/2016  . CKD (chronic kidney disease), stage III ([place]) 08/19/2016  . Altered mental status 08/19/2016  . Asthma exacerbation 06/18/2016  . Insomnia 06/12/2016  . Difficult intravenous access   . Acute on chronic respiratory failure with hypoxia ([place]) 05/19/2016  . NSTEMI (non-ST elevated myocardial infarction) ([place]) 05/19/2016  . Acute metabolic encephalopathy 29/02/1113  . Hyperlipidemia   . Class 3 obesity due to excess calories with serious comorbidity and body mass index (BMI) of 40.0 to 44.9 in adult   . Obesity hypoventilation syndrome ([place])   . Diastolic dysfunction 52/08/221  . Diabetic neuropathy ([place]) 11/10/2014  . Fracture of distal femur ([place]) 07/08/2014  . Osteoporosis 07/08/2014  . Femur fracture, left ([place]) 07/08/2014  . Colles' fracture of left radius   . Left hip pain 05/03/2014  . Perimenopausal symptoms 02/13/2012  . Hyponatremia 12/29/2011  . Asthma exacerbation attacks 12/28/2011  . Bronchitis 12/28/2011  . Repeated falls 07/30/2011  . Hip fracture, left ([place]) 07/16/2011  . Femoral neck fracture ([place]) 07/16/2011  . Tobacco abuse 04/14/2010  . Dyslipidemia 12/23/2008  . COPD with acute bronchitis ([place]) 11/29/2008  . THRUSH 05/20/2008  . Human immunodeficiency virus (HIV) disease ([place]) 04/19/2008  . Diabetes mellitus type 2 with complications, uncontrolled ([place]) 04/19/2008  . Mood disorder ([place]) 04/19/2008  . Hereditary and idiopathic peripheral neuropathy 04/19/2008  . Essential hypertension 04/19/2008    Past Surgical History:  Procedure Laterality Date  . AMPUTATION Left 11/18/2014   Procedure: LEFT BELOW KNEE AMPUTATION;  Surgeon: Leandrew Koyanagi, MD;  Location: [facility];  Service: Orthopedics;  Laterality: Left;  . CAST APPLICATION Left 03/07/1222   Procedure: CAST  APPLICATION (FIBERGLASS);  Surgeon: Latanya Maudlin, MD;  Location: [facility];  Service: Orthopedics;  Laterality: Left;  CLOSED REDUCTION OF LEFT COLLES FRACTURE WITH SHORT ARM CAST  . HARDWARE REMOVAL Left 05/05/2014   Procedure: HARDWARE REMOVAL LEFT HIP;  Surgeon: Latanya Maudlin, MD;  Location: [facility];  Service: Orthopedics;  Laterality: Left;  REMOVAL BIOMET 6.5-8.0 CANNULATED SCREW  . HIP ARTHROPLASTY Left 05/05/2014   Procedure: ARTHROPLASTY OPEN REDUCTION INTERNAL FIXATION LEFT HIP AND REMOVAL OF TWO CANNULATED SCREW;  Surgeon: Latanya Maudlin, MD;  Location: [facility];  Service: Orthopedics;  Laterality: Left;  . HIP PINNING,CANNULATED  07/16/2011   Procedure: CANNULATED HIP PINNING;  Surgeon: Gearlean Alf, MD;  Location: [facility];  Service: Orthopedics;  Laterality: Left;  . HIP PINNING,CANNULATED Left 04/09/2012   Procedure: CANNULATED HIP PINNING AND HARDWARE REVISION;  Surgeon: Gearlean Alf, MD;  Location: [facility];  Service: Orthopedics;  Laterality: Left;  .  I&D EXTREMITY Left 11/16/2014   Procedure:  DEBRIDEMENT OF LEFT FOOT POSSIBLE APPLICATION OF INTEGRIA AND VAC ;  Surgeon: Irene Limbo, MD;  Location: [facility];  Service: Plastics;  Laterality: Left;  . PERIPHERAL VASCULAR CATHETERIZATION N/A 11/15/2014   Procedure: Abdominal Aortogram;  Surgeon: Conrad Moenkopi, MD;  Location: [facility];  Service: Cardiovascular;  Laterality: N/A;  . PERIPHERAL VASCULAR CATHETERIZATION  11/15/2014   Procedure: Lower Extremity Angiography;  Surgeon: Conrad Milltown, MD;  Location: [facility];  Service: Cardiovascular;;  . PERIPHERAL VASCULAR CATHETERIZATION Left 11/15/2014   Procedure: Peripheral Vascular Intervention;  Surgeon: Conrad King, MD;  Location: [facility];  Service: Cardiovascular;  Laterality: Left;  popliteal artery stenting     OB History    Gravida  2   Para  1   Term  1   Preterm  0   AB  1   Living  1     SAB  1   TAB      Ectopic  0   Multiple  0     Live Births               Home Medications    Prior to Admission medications   Medication Sig Start Date End Date Taking? Authorizing Provider  albuterol (PROVENTIL HFA;VENTOLIN HFA) 108 (90 Base) MCG/ACT inhaler INHALE 2 PUFFS INTO THE LUNGS EVERY 6 HOURS AS NEEDED FOR WHEEZING OR SHORTNESS OF BREATH Patient taking differently: Inhale 2 puffs into the lungs every 6 (six) hours as needed for wheezing or shortness of breath.  03/08/17  Yes Chesley Mires, MD  ARIPiprazole (ABILIFY) 15 MG tablet Take 15 mg by mouth daily.   Yes [provider]  BREO ELLIPTA 100-25 MCG/INH AEPB INHALE 1 PUFF INTO THE LUNGS DAILY Patient taking differently: Inhale 1 puff into the lungs daily.  05/29/17  Yes Chesley Mires, MD  clonazePAM (KLONOPIN) 0.5 MG tablet Take 0.5 mg by mouth 3 (three) times daily as needed for anxiety.   Yes [provider]  diphenoxylate-atropine (LOMOTIL) 2.5-0.025 MG tablet Take 1 tablet by mouth 4 (four) times daily as needed for diarrhea or loose stools. 11/20/16  Yes Campbell Riches, MD  doxepin (SINEQUAN) 10 MG capsule Take 10 mg by mouth at bedtime.  02/13/17  Yes [provider]  elvitegravir-cobicistat-emtricitabine-tenofovir (GENVOYA) 150-150-200-10 MG TABS tablet Take 1 tablet by mouth daily. 05/23/17  Yes Campbell Riches, MD  fenofibrate (TRICOR) 145 MG tablet Take 145 mg by mouth daily.   Yes [provider]  furosemide (LASIX) 80 MG tablet Take 80 mg by mouth daily. 02/10/17  Yes [provider]  HYDROcodone-acetaminophen (NORCO) 10-325 MG tablet Take 1 tablet by mouth every 8 (eight) hours as needed (pain).   Yes [provider]  Insulin Glargine (TOUJEO MAX SOLOSTAR) 300 UNIT/ML SOPN Inject 60 Units into the skin 2 (two) times daily. 05/29/17  Yes Elayne Snare, MD  insulin regular human CONCENTRATED (HUMULIN R U-500 KWIKPEN) 500 UNIT/ML kwikpen INJECT 60 UNITS BEFORE BREAKFAST, 50 UNITS BEFORE LUNCH, AND 35 UNITS BEFORE  DINNER. 05/29/17  Yes Elayne Snare, MD  liraglutide (VICTOZA) 18 MG/3ML SOPN Inject 0.3 mLs (1.8 mg total) into the skin daily. 05/29/17  Yes Elayne Snare, MD  MAGNESIUM-OXIDE 400 (241.3 Mg) MG tablet Take 400 mg by mouth daily. 02/15/17  Yes [provider]  OXYGEN Inhale 3 L into the lungs continuous.   Yes [provider]  pantoprazole (  PROTONIX) 40 MG tablet Take 40 mg by mouth daily. 09/02/14  Yes [provider]  predniSONE (DELTASONE) 20 MG tablet Take 2 tablets (40 mg total) by mouth daily. 08/25/17  Yes Virgel Manifold, MD  rosuvastatin (CRESTOR) 20 MG tablet Take 20 mg by mouth daily. Reported on 05/02/2015 09/02/14  Yes [provider]  SYMBICORT 80-4.5 MCG/ACT inhaler Inhale 1 puff into the lungs every 12 (twelve) hours as needed (SOB, wheezing).  02/26/17  Yes [provider]  temazepam (RESTORIL) 15 MG capsule Take 30 mg by mouth at bedtime as needed for sleep.  02/13/17  Yes [provider]  tiZANidine (ZANAFLEX) 4 MG tablet Take 4 mg by mouth every 8 (eight) hours.  03/06/16  Yes [provider]  NARCAN 4 MG/0.1ML LIQD nasal spray kit Place 1 spray into the nose as needed. overdose 02/01/17   [provider]  potassium chloride (K-DUR) 10 MEQ tablet Take 1 tablet (10 mEq total) by mouth daily. Patient not taking: Reported on 08/26/2017 02/26/17   Recardo Evangelist, PA-C  potassium chloride SA (K-DUR,KLOR-CON) 20 MEQ tablet Take 1 tablet (20 mEq total) by mouth daily. X 3 days, then check potassium levels if needed to continue Patient not taking: Reported on 08/26/2017 05/25/16   Mendel Corning, MD    Family History Family History  Problem Relation Age of Onset  . Diabetes Mother   . Hypertension Mother   . Vision loss Mother   . Heart disease Mother   . Hypertension Father   . Thyroid disease Neg Hx     Social History Social History   Tobacco Use  . Smoking status: Current Every Day Smoker    Packs/day: 0.50     Years: 35.00    Pack years: 17.50    Types: E-cigarettes, Cigarettes  . Smokeless tobacco: Never Used  Substance Use Topics  . Alcohol use: No    Alcohol/week: 0.0 standard drinks    Comment: no drink since 2008  . Drug use: No    Comment: hx of crack/cocaine, clean 8 years     Allergies   Cortizone-10 [hydrocortisone]   Review of Systems Review of Systems  Respiratory: Positive for shortness of breath.   All other systems reviewed and are negative.    Physical Exam Updated Vital Signs BP (!) 149/80 (BP Location: Right Arm)   Pulse (!) 103   Temp 98 F (36.7 C) (Oral)   Resp 20   Ht 5' 10"  (1.778 m)   Wt 107.5 kg   LMP 11/16/2010   SpO2 100%   BMI 34.01 kg/m   Physical Exam  Constitutional: She appears well-developed and well-nourished.  HENT:  Head: Normocephalic and atraumatic.  Cardiovascular: Regular rhythm.  No murmur heard. Tachycardic  Pulmonary/Chest:  Tachypnea with diffuse rhonchi.  Abdominal: Soft. There is no tenderness. There is no rebound and no guarding.  Musculoskeletal: She exhibits no edema or tenderness.  Left BKA  Neurological: She is alert.  Mildly confused. Oriented to place and time.   Skin: Skin is warm and dry.  Multiple areas of ecchymosis to BUE.   Psychiatric: She has a normal mood and affect. Her behavior is normal.  Nursing note and vitals reviewed.    ED Treatments / Results  Labs (all labs ordered are listed, but only abnormal results are displayed) Labs Reviewed  BLOOD GAS, VENOUS - Abnormal; Notable for the following components:      Result Value   pH, Ven 7.433 (*)  pCO2, Ven 33.4 (*)    All other components within normal limits  COMPREHENSIVE METABOLIC PANEL  CBC  TROPONIN I  TROPONIN I  TROPONIN I  D-DIMER, QUANTITATIVE (NOT AT [facility])  I-STAT TROPONIN, ED    EKG EKG Interpretation  Date/Time:  Monday August 26 2017 21:43:42 EDT Ventricular Rate:  103 PR Interval:    QRS Duration: 84 QT  Interval:  347 QTC Calculation: 455 R Axis:   4 Text Interpretation:  Sinus tachycardia Confirmed by Quintella Reichert [phone]) on 08/26/2017 10:10:36 PM   Radiology Dg Chest 2 View  Result Date: 08/26/2017 CLINICAL DATA:  52 year old female with shortness of breath and asthma EXAM: CHEST - 2 VIEW COMPARISON:  Prior chest x-ray 08/25/2017 FINDINGS: Mild cardiomegaly, stable. Atherosclerotic calcifications are present in the transverse aorta. No mediastinal abnormality. No overt pulmonary edema, pleural effusion, pneumothorax or focal airspace consolidation. Chronic bronchitic changes are stable. No acute osseous abnormality. IMPRESSION: Stable cardiomegaly without evidence of acute cardiopulmonary process. Electronically Signed   By: Jacqulynn Cadet M.D.   On: 08/26/2017 10:38   Dg Chest Port 1 View  Result Date: 08/26/2017 CLINICAL DATA:  Asthmatic, dyspnea and wheeze. EXAM: PORTABLE CHEST 1 VIEW COMPARISON:  08/26/2017 FINDINGS: AP portable semi upright view of the chest. Stable cardiomegaly. Chronic mild bronchitic change of the lungs. No overt pulmonary edema or pulmonary consolidations. Aortic arch is obscured due to overlap with the thoracic spine given patient's slight rotation. No acute osseous abnormality. IMPRESSION: Cardiomegaly. Chronic mild bronchitic change of the lungs. No active pulmonary disease. Electronically Signed   By: Ashley Royalty M.D.   On: 08/26/2017 21:32   Dg Chest Portable 1 View  Result Date: 08/25/2017 CLINICAL DATA:  Shortness of breath, wheezing EXAM: PORTABLE CHEST 1 VIEW COMPARISON:  03/02/2017 FINDINGS: Cardiomegaly. No overt edema. No confluent opacities or effusions. No acute bony abnormality. IMPRESSION: Cardiomegaly.  No active disease. Electronically Signed   By: Rolm Baptise M.D.   On: 08/25/2017 21:31    Procedures Procedures (including critical care time)  Medications Ordered in ED Medications  enoxaparin (LOVENOX) injection 40 mg (has no administration  in time range)  sodium chloride flush (NS) 0.9 % injection 3 mL (has no administration in time range)  sodium chloride flush (NS) 0.9 % injection 3 mL (has no administration in time range)  0.9 %  sodium chloride infusion (has no administration in time range)  acetaminophen (TYLENOL) tablet 650 mg (has no administration in time range)    Or  acetaminophen (TYLENOL) suppository 650 mg (has no administration in time range)  insulin aspart (novoLOG) injection 0-9 Units (has no administration in time range)  insulin aspart (novoLOG) injection 0-5 Units (has no administration in time range)  azithromycin (ZITHROMAX) 500 mg in sodium chloride 0.9 % 250 mL IVPB (has no administration in time range)  methylPREDNISolone sodium succinate (SOLU-MEDROL) 125 mg/2 mL injection 80 mg (has no administration in time range)  albuterol (PROVENTIL) (2.5 MG/3ML) 0.083% nebulizer solution 2.5 mg (has no administration in time range)  albuterol (PROVENTIL) (2.5 MG/3ML) 0.083% nebulizer solution 2.5 mg (has no administration in time range)  umeclidinium bromide (INCRUSE ELLIPTA) 62.5 MCG/INH 1 puff (has no administration in time range)  ipratropium-albuterol (DUONEB) 0.5-2.5 (3) MG/3ML nebulizer solution 3 mL (3 mLs Nebulization Given 08/26/17 2114)  HYDROcodone-acetaminophen (NORCO/VICODIN) 5-325 MG per tablet 2 tablet (2 tablets Oral Given 08/26/17 2115)  albuterol (PROVENTIL) (2.5 MG/3ML) 0.083% nebulizer solution 2.5 mg (2.5 mg Nebulization Given 08/26/17 2302)  Initial Impression / Assessment and Plan / ED Course  I have reviewed the triage vital signs and the nursing notes.  Pertinent labs & imaging results that were available during my care of the patient were reviewed by me and considered in my medical decision making (see chart for details).     Patient with history of COPD on chronic home oxygen here for evaluation of increased shortness of breath. This is her third ED visit in the last 24 hours. She is  mildly confused and unsure of what transpired during earlier visits. She does live alone. She is nonfocal neurologic examination. She does have increased work of breathing with diffuse wheezes and rhonchi. She does have mild improvement in her symptoms following albuterol use. She received Solu-Medrol prior to ED arrival. Plan to admit for COPD exacerbation given persistent symptoms. Hospitals consulted for admission. Patient is in agreement with admission for further treatment.  Final Clinical Impressions(s) / ED Diagnoses   Final diagnoses:  None    ED Discharge Orders    None       Quintella Reichert, MD 08/26/17 2336

## 2017-08-26 NOTE — Discharge Instructions (Signed)
Your work-up today was consistent with a recurrent COPD exacerbation.  Please fill the prednisone prescription you have from yesterday.  Please follow-up with your doctor.  If you develop any new or worsened symptoms, please return to the nearest emergency department.

## 2017-08-26 NOTE — ED Notes (Addendum)
This writer tried to pull blood from IV for tests. Unsuccessful attempt. Contacted phlebotomy to draw labs.

## 2017-08-26 NOTE — ED Notes (Signed)
[facility] contacted for transport 

## 2017-08-26 NOTE — ED Provider Notes (Signed)
[facility] EMERGENCY DEPARTMENT Provider Note   CSN: [id number] Arrival date & time: 08/26/17  8563     History   Chief Complaint Chief Complaint  Patient presents with  . Shortness of Breath    HPI [name] is a 52 y.o. female.  The history is provided by the patient and medical records. No language interpreter was used.  Shortness of Breath  This is a recurrent problem. The average episode lasts 2 days. The problem occurs continuously.The current episode started 2 days ago. The problem has not changed since onset.Associated symptoms include cough, sputum production and wheezing. Pertinent negatives include no fever, no headaches, no rhinorrhea, no neck pain, no chest pain, no syncope, no vomiting, no rash, no leg pain (L AKA) and no leg swelling. She has tried beta-agonist inhalers for the symptoms. The treatment provided no relief. She has had prior ED visits. Associated medical issues include COPD.    Past Medical History:  Diagnosis Date  . Acute metabolic encephalopathy 1/49/7026  . Acute on chronic respiratory failure with hypoxia ([place]) 05/19/2016  . Anxiety   . ARF (acute renal failure) ([place]) 05/03/2014  . Arthritis    LEFT HIP  . Asthma    HOSPITALIZED WITH EXCERBATION OF ASTHMA - AND BRONCHITIS AND THE FLU DEC 2013  . Asthma exacerbation attacks 12/28/2011  . Bronchitis 12/28/2011  . CAP (community acquired pneumonia) 07/25/2012  . Chronic kidney disease (CKD), stage IV (severe) ([place]) 11/10/2014  . Class 3 obesity due to excess calories with serious comorbidity and body mass index (BMI) of 40.0 to 44.9 in adult   . Colles' fracture of left radius   . Depression   . Diabetes mellitus    ON INSULIN AND ORAL MEDICATIONS  . Diabetes mellitus type 2 with complications, uncontrolled ([place]) 04/19/2008   Qualifier: Diagnosis of  By: Tomma Lightning MD, Claiborne Billings     . Diabetic foot infection ([place]) 11/09/2014  . Diabetic ketoacidosis without coma associated with  type 2 diabetes mellitus ([place])   . Diabetic neuropathy ([place]) 11/10/2014  . Diarrhea 08/30/2008   Qualifier: Diagnosis of  By: Tomma Lightning MD, Claiborne Billings    . Diastolic dysfunction 37/08/5883  . Difficult intravenous access   . DKA, type 2 ([place]) 04/04/2016  . Dyslipidemia 12/23/2008   Qualifier: Diagnosis of  By: Tomma Lightning MD, Claiborne Billings    . Essential hypertension 04/19/2008   Qualifier: Diagnosis of  By: Tomma Lightning MD, Claiborne Billings    . FATIGUE 07/12/2008   Qualifier: Diagnosis of  By: Tomma Lightning MD, Claiborne Billings    . Femoral neck fracture ([place]) 07/16/2011  . Femur fracture, left ([place]) 07/08/2014  . Fever   . Fracture of distal femur ([place]) 07/08/2014  . GERD (gastroesophageal reflux disease)   . Hereditary and idiopathic peripheral neuropathy 04/19/2008   Qualifier: Diagnosis of  By: Tomma Lightning MD, Claiborne Billings    . Hip fracture, left ([place]) 07/16/2011  . Hip pain   . HIV infection ([place]) 2000  . Human immunodeficiency virus (HIV) disease ([place]) 04/19/2008   [id number] +   . Hyperlipidemia   . Hypertension   . Hyponatremia 12/29/2011  . Influenza A 12/29/2011  . Influenza B   . Insomnia 06/12/2016  . Left hip pain 05/03/2014  . Mood disorder ([place]) 04/19/2008   Qualifier: Diagnosis of  By: Tomma Lightning MD, Claiborne Billings    . Neuropathy    NEUROPATHY HANDS AND FEET  . NSTEMI (non-ST elevated myocardial infarction) ([place]) 05/19/2016  . Obesity hypoventilation syndrome ([place])   .  Osteoporosis 07/08/2014  . Pain    SEVERE PAIN LEFT HIP - HX OF LEFT HIP PINNING JULY 2013  . Perimenopausal symptoms 02/13/2012   LMP around 2011. On estrace and provera since around 2012 for hot flashes, mood swings. Estrace 2 mg daily, Provera 2.5 mg for 5 days each month.   . Repeated falls 07/30/2011  . Sepsis ([place]) 04/04/2016  . Shortness of breath    ALLERGIES ARE "ACTING UP"  . SOB (shortness of breath)   . THRUSH 05/20/2008   Qualifier: Diagnosis of  By: Tomma Lightning MD, Claiborne Billings      . Tobacco use disorder 04/14/2010    Patient Active Problem List   Diagnosis Date Noted  . DKA  (diabetic ketoacidoses) ([place]) 03/02/2017  . COPD exacerbation ([place]) 02/23/2017  . Acute on chronic respiratory failure with hypoxia and hypercapnia ([place]) 08/19/2016  . Leukocytosis 08/19/2016  . CKD (chronic kidney disease), stage III ([place]) 08/19/2016  . Altered mental status 08/19/2016  . Asthma exacerbation 06/18/2016  . Insomnia 06/12/2016  . Difficult intravenous access   . Acute on chronic respiratory failure with hypoxia ([place]) 05/19/2016  . NSTEMI (non-ST elevated myocardial infarction) ([place]) 05/19/2016  . Acute metabolic encephalopathy 99/37/1696  . Hyperlipidemia   . Class 3 obesity due to excess calories with serious comorbidity and body mass index (BMI) of 40.0 to 44.9 in adult   . Obesity hypoventilation syndrome ([place])   . Diastolic dysfunction 78/93/8101  . Diabetic neuropathy ([place]) 11/10/2014  . Fracture of distal femur ([place]) 07/08/2014  . Osteoporosis 07/08/2014  . Femur fracture, left ([place]) 07/08/2014  . Colles' fracture of left radius   . Left hip pain 05/03/2014  . Perimenopausal symptoms 02/13/2012  . Hyponatremia 12/29/2011  . Asthma exacerbation attacks 12/28/2011  . Bronchitis 12/28/2011  . Repeated falls 07/30/2011  . Hip fracture, left ([place]) 07/16/2011  . Femoral neck fracture ([place]) 07/16/2011  . Tobacco abuse 04/14/2010  . Dyslipidemia 12/23/2008  . COPD with acute bronchitis ([place]) 11/29/2008  . THRUSH 05/20/2008  . Human immunodeficiency virus (HIV) disease ([place]) 04/19/2008  . Diabetes mellitus type 2 with complications, uncontrolled ([place]) 04/19/2008  . Mood disorder ([place]) 04/19/2008  . Hereditary and idiopathic peripheral neuropathy 04/19/2008  . Essential hypertension 04/19/2008    Past Surgical History:  Procedure Laterality Date  . AMPUTATION Left 11/18/2014   Procedure: LEFT BELOW KNEE AMPUTATION;  Surgeon: Leandrew Koyanagi, MD;  Location: [facility];  Service: Orthopedics;  Laterality: Left;  . CAST APPLICATION Left 07/06/1023   Procedure: CAST  APPLICATION (FIBERGLASS);  Surgeon: Latanya Maudlin, MD;  Location: [facility];  Service: Orthopedics;  Laterality: Left;  CLOSED REDUCTION OF LEFT COLLES FRACTURE WITH SHORT ARM CAST  . HARDWARE REMOVAL Left 05/05/2014   Procedure: HARDWARE REMOVAL LEFT HIP;  Surgeon: Latanya Maudlin, MD;  Location: [facility];  Service: Orthopedics;  Laterality: Left;  REMOVAL BIOMET 6.5-8.0 CANNULATED SCREW  . HIP ARTHROPLASTY Left 05/05/2014   Procedure: ARTHROPLASTY OPEN REDUCTION INTERNAL FIXATION LEFT HIP AND REMOVAL OF TWO CANNULATED SCREW;  Surgeon: Latanya Maudlin, MD;  [facility]: [facility];  Service: Orthopedics;  Laterality: Left;  . HIP PINNING,CANNULATED  07/16/2011   Procedure: CANNULATED HIP PINNING;  Surgeon: Gearlean Alf, MD;  [facility];  Service: Orthopedics;  Laterality: Left;  . HIP PINNING,CANNULATED Left 04/09/2012   Procedure: CANNULATED HIP PINNING AND HARDWARE REVISION;  Surgeon: Gearlean Alf, MD;  [facility];  Service: Orthopedics;  Laterality: Left;  . I&D EXTREMITY Left 11/16/2014  Procedure:  DEBRIDEMENT OF LEFT FOOT POSSIBLE APPLICATION OF INTEGRIA AND VAC ;  Surgeon: Irene Limbo, MD;  Location: [facility];  Service: Plastics;  Laterality: Left;  . PERIPHERAL VASCULAR CATHETERIZATION N/A 11/15/2014   Procedure: Abdominal Aortogram;  Surgeon: Conrad [place], MD;  Location: [facility];  Service: Cardiovascular;  Laterality: N/A;  . PERIPHERAL VASCULAR CATHETERIZATION  11/15/2014   Procedure: Lower Extremity Angiography;  Surgeon: Conrad [place], MD;  Location: [facility];  Service: Cardiovascular;;  . PERIPHERAL VASCULAR CATHETERIZATION Left 11/15/2014   Procedure: Peripheral Vascular Intervention;  Surgeon: Conrad [place], MD;  Location: [facility];  Service: Cardiovascular;  Laterality: Left;  popliteal artery stenting     OB History    Gravida  2   Para  1   Term  1   Preterm  0   AB  1   Living  1     SAB  1   TAB      Ectopic  0   Multiple  0     Live Births               Home Medications    Prior to Admission medications   Medication Sig Start Date End Date Taking? Authorizing Provider  albuterol (PROVENTIL HFA;VENTOLIN HFA) 108 (90 Base) MCG/ACT inhaler INHALE 2 PUFFS INTO THE LUNGS EVERY 6 HOURS AS NEEDED FOR WHEEZING OR SHORTNESS OF BREATH Patient taking differently: Inhale 2 puffs into the lungs every 6 (six) hours as needed for wheezing or shortness of breath.  03/08/17   Chesley Mires, MD  ARIPiprazole (ABILIFY) 15 MG tablet Take 15 mg by mouth daily.    [provider]  BREO ELLIPTA 100-25 MCG/INH AEPB INHALE 1 PUFF INTO THE LUNGS DAILY Patient taking differently: Inhale 1 puff into the lungs daily.  05/29/17   Chesley Mires, MD  clonazePAM (KLONOPIN) 0.5 MG tablet Take 0.5 mg by mouth 3 (three) times daily as needed for anxiety.    [provider]  diphenoxylate-atropine (LOMOTIL) 2.5-0.025 MG tablet Take 1 tablet by mouth 4 (four) times daily as needed for diarrhea or loose stools. 11/20/16   Campbell Riches, MD  doxepin (SINEQUAN) 10 MG capsule Take 10 mg by mouth at bedtime.  02/13/17   [provider]  elvitegravir-cobicistat-emtricitabine-tenofovir (GENVOYA) 150-150-200-10 MG TABS tablet Take 1 tablet by mouth daily. 05/23/17   Campbell Riches, MD  fenofibrate (TRICOR) 145 MG tablet Take 145 mg by mouth daily.    [provider]  furosemide (LASIX) 80 MG tablet Take 80 mg by mouth daily. 02/10/17   [provider]  HYDROcodone-acetaminophen (NORCO) 10-325 MG tablet Take 1 tablet by mouth every 8 (eight) hours as needed (pain).    [provider]  Insulin Glargine (TOUJEO MAX SOLOSTAR) 300 UNIT/ML SOPN Inject 60 Units into the skin 2 (two) times daily. 05/29/17   Elayne Snare, MD  insulin regular human CONCENTRATED (HUMULIN R U-500 KWIKPEN) 500 UNIT/ML kwikpen INJECT 60 UNITS BEFORE BREAKFAST, 50 UNITS BEFORE LUNCH, AND 35 UNITS BEFORE DINNER. 05/29/17   Elayne Snare, MD   liraglutide (VICTOZA) 18 MG/3ML SOPN Inject 0.3 mLs (1.8 mg total) into the skin daily. 05/29/17   Elayne Snare, MD  MAGNESIUM-OXIDE 400 (241.3 Mg) MG tablet Take 400 mg by mouth daily. 02/15/17   [provider]  NARCAN 4 MG/0.1ML LIQD nasal spray kit Place 1 spray into the nose as needed. overdose 02/01/17   [provider]  OXYGEN Inhale 3 L into the lungs continuous.    [provider]  pantoprazole (PROTONIX) 40 MG tablet Take 40 mg by mouth daily. 09/02/14   [provider]  potassium chloride (K-DUR) 10 MEQ tablet Take 1 tablet (10 mEq total) by mouth daily. Patient not taking: Reported on 08/25/2017 02/26/17   Recardo Evangelist, PA-C  potassium chloride SA (K-DUR,KLOR-CON) 20 MEQ tablet Take 1 tablet (20 mEq total) by mouth daily. X 3 days, then check potassium levels if needed to continue Patient taking differently: Take 20 mEq by mouth daily.  05/25/16   Rai, Vernelle Emerald, MD  predniSONE (DELTASONE) 20 MG tablet Take 2 tablets (40 mg total) by mouth daily. 08/25/17   Virgel Manifold, MD  pregabalin (LYRICA) 225 MG capsule Take 225 mg by mouth 2 (two) times daily.    [provider]  rosuvastatin (CRESTOR) 20 MG tablet Take 20 mg by mouth daily. Reported on 05/02/2015 09/02/14   [provider]  SYMBICORT 80-4.5 MCG/ACT inhaler Inhale 1 puff into the lungs every 12 (twelve) hours as needed (SOB, wheezing).  02/26/17   [provider]  temazepam (RESTORIL) 15 MG capsule Take 30 mg by mouth at bedtime as needed for sleep.  02/13/17   [provider]  tiZANidine (ZANAFLEX) 4 MG tablet Take 4 mg by mouth every 8 (eight) hours.  03/06/16   [provider]    Family History Family History  Problem Relation Age of Onset  . Diabetes Mother   . Hypertension Mother   . Vision loss Mother   . Heart disease Mother   . Hypertension Father   . Thyroid disease Neg Hx     Social History Social History   Tobacco Use  . Smoking  status: Current Every Day Smoker    Packs/day: 0.50    Years: 35.00    Pack years: 17.50    Types: E-cigarettes, Cigarettes  . Smokeless tobacco: Never Used  Substance Use Topics  . Alcohol use: No    Alcohol/week: 0.0 standard drinks    Comment: no drink since 2008  . Drug use: No    Comment: hx of crack/cocaine, clean 8 years     Allergies   Cortizone-10 [hydrocortisone]   Review of Systems Review of Systems  Constitutional: Negative for chills, diaphoresis, fatigue and fever.  HENT: Negative for congestion and rhinorrhea.   Eyes: Negative for visual disturbance.  Respiratory: Positive for cough, sputum production, chest tightness, shortness of breath and wheezing. Negative for stridor.   Cardiovascular: Negative for chest pain, palpitations, leg swelling and syncope.  Gastrointestinal: Negative for blood in stool, constipation, diarrhea, nausea and vomiting.  Genitourinary: Negative for dysuria.  Musculoskeletal: Negative for back pain, neck pain and neck stiffness.  Skin: Negative for rash and wound.  Neurological: Negative for light-headedness and headaches.  All other systems reviewed and are negative.    Physical Exam Updated Vital Signs BP (!) 115/97   Pulse (!) 107   Temp (!) 96.9 F (36.1 C) (Axillary)   Resp 13   LMP 11/16/2010   SpO2 96%   Physical Exam  Constitutional: She is oriented to person, place, and time. She appears well-developed and well-nourished.  Non-toxic appearance. She does not appear ill. No distress.  HENT:  Head: Normocephalic and atraumatic.  Mouth/Throat: Oropharynx is clear and moist. No oropharyngeal exudate.  Eyes: Pupils are equal, round, and reactive to light. Conjunctivae and EOM are normal.  Neck: Normal range of motion. Neck supple.  Cardiovascular: Normal rate and regular rhythm.  No murmur heard. Pulmonary/Chest: Effort normal. No respiratory distress. She has wheezes. She has no rales. She exhibits no tenderness.    Abdominal: Soft. There is no tenderness.  Musculoskeletal: She exhibits no edema or tenderness.  Neurological: She is alert and oriented to person, place, and time. No sensory deficit. She exhibits normal muscle tone.  Skin: Skin is warm and dry. Capillary refill takes less than 2 seconds. No rash noted. She is not diaphoretic. No erythema.  Psychiatric: She has a normal mood and affect.  Nursing note and vitals reviewed.    ED Treatments / Results  Labs (all labs ordered are listed, but only abnormal results are displayed) Labs Reviewed  CBC WITH DIFFERENTIAL/PLATELET - Abnormal; Notable for the following components:      Result Value   WBC 13.5 (*)    RDW 19.5 (*)    Neutro Abs 12.3 (*)    All other components within normal limits  COMPREHENSIVE METABOLIC PANEL - Abnormal; Notable for the following components:   Glucose, Bld 449 (*)    BUN 26 (*)    Creatinine, Ser 1.27 (*)    Total Protein 6.3 (*)    Albumin 3.2 (*)    GFR calc non Af Amer 47 (*)    GFR calc Af Amer 55 (*)    Anion gap 16 (*)    All other components within normal limits  BRAIN NATRIURETIC PEPTIDE - Abnormal; Notable for the following components:   B Natriuretic Peptide 157.0 (*)    All other components within normal limits  I-STAT CG4 LACTIC ACID, ED - Abnormal; Notable for the following components:   Lactic Acid, Venous 1.98 (*)    All other components within normal limits  I-STAT VENOUS BLOOD GAS, ED - Abnormal; Notable for the following components:   pH, Ven 7.445 (*)    pO2, Ven 55.0 (*)    Bicarbonate 30.8 (*)    Acid-Base Excess 6.0 (*)    All other components within normal limits  I-STAT VENOUS BLOOD GAS, ED - Abnormal; Notable for the following components:   pO2, Ven 22.0 (*)    Bicarbonate 36.3 (*)    TCO2 38 (*)    Acid-Base Excess 10.0 (*)    All other components within normal limits  I-STAT CG4 LACTIC ACID, ED - Abnormal; Notable for the following components:   Lactic Acid, Venous 1.94  (*)    All other components within normal limits  URINE CULTURE  LIPASE, BLOOD  URINALYSIS, ROUTINE W REFLEX MICROSCOPIC  BLOOD GAS, VENOUS  I-STAT TROPONIN, ED  I-STAT TROPONIN, ED    EKG EKG Interpretation  Date/Time:  Monday August 26 2017 09:55:07 EDT Ventricular Rate:  111 PR Interval:    QRS Duration: 85 QT Interval:  343 QTC Calculation: 467 R Axis:   7 Text Interpretation:  Sinus tachycardia When compared to prior, no significant changes seen,  No STEMI Confirmed by Antony Blackbird [phone]) on 08/26/2017 10:02:50 AM   Radiology Dg Chest 2 View  Result Date: 08/26/2017 CLINICAL DATA:  52 year old female with shortness of breath and asthma EXAM: CHEST - 2 VIEW COMPARISON:  Prior chest x-ray 08/25/2017 FINDINGS: Mild cardiomegaly, stable. Atherosclerotic calcifications are present in the transverse aorta. No mediastinal abnormality. No overt pulmonary edema, pleural effusion, pneumothorax or focal airspace consolidation. Chronic bronchitic changes are stable. No acute osseous abnormality. IMPRESSION: Stable cardiomegaly without evidence of acute cardiopulmonary process. Electronically Signed   By: Myrle Sheng  Laurence Ferrari M.D.   On: 08/26/2017 10:38   Dg Chest Portable 1 View  Result Date: 08/25/2017 CLINICAL DATA:  Shortness of breath, wheezing EXAM: PORTABLE CHEST 1 VIEW COMPARISON:  03/02/2017 FINDINGS: Cardiomegaly. No overt edema. No confluent opacities or effusions. No acute bony abnormality. IMPRESSION: Cardiomegaly.  No active disease. Electronically Signed   By: Rolm Baptise M.D.   On: 08/25/2017 21:31    Procedures Procedures (including critical care time)  Medications Ordered in ED Medications  albuterol (PROVENTIL,VENTOLIN) solution continuous neb (10 mg/hr Nebulization New Bag/Given 08/26/17 1320)  methylPREDNISolone sodium succinate (SOLU-MEDROL) 125 mg/2 mL injection 125 mg (125 mg Intravenous Given 08/26/17 1112)  magnesium sulfate IVPB 2 g 50 mL (0 g Intravenous  Stopped 08/26/17 1225)  HYDROcodone-acetaminophen (NORCO) 10-325 MG per tablet 1 tablet (1 tablet Oral Given 08/26/17 1231)     Initial Impression / Assessment and Plan / ED Course  I have reviewed the triage vital signs and the nursing notes.  Pertinent labs & imaging results that were available during my care of the patient were reviewed by me and considered in my medical decision making (see chart for details).     [name] is a 52 y.o. female with a past medical history significant for HIV, diabetes, COPD on 3 L home oxygen at baseline, asthma, CHF, hyperlipidemia, prior DKA, CKD, and obesity who presents for cough, shortness of breath, and malaise.  Patient reports that she was seen yesterday for respiratory problems and was told she has COPD exacerbation was sent home.  She did not yet fill her prescription for prednisone and reports she did smoke overnight.  She says that this morning she had worsening shortness of breath and used her inhaler which did not help her breathing.  Patient was picked up by EMS this morning and given a breathing treatment with some improvement in her breathing.  She denies any chest pain or palpitations.  She denies nausea vomiting, urinary symptoms or GI symptoms.  Patient's glucose was found to be in the 400s by EMS.  She denies any headache, vision changes or other neurologic complaints.  On exam, patient had significant wheezing all lung fields as she was getting a breathing treatment.  She also had crackles in the bases and some rhonchi.  She had nontender chest or abdomen.  She reports chronic back pain but no tenderness on exam.  Patient has a left AKA and right leg had no significant swelling or pain.  She denies other complaints.  Next  Clinically I am concerned she is having a COPD exacerbation however with her CHF history we will obtain a BNP and troponin.  We will also obtain work-up for occult infection with her urine given the elevated glucose  to make sure she is not in DKA.   Patient be given steroids and magnesium for the respiratory distress.    Anticipate reassessment.  Patient's laboratory testing showed negative troponin.  Patient does not appear to be acidotic.  Patient's white blood cell count was elevated with leukocytosis but no anemia.  Metabolic panel showed elevated glucose of 449 and creatinine was only 1.27.  Patient did have an anion gap of 16.  Lipase not elevated.  BNP mildly elevated at 157.  Chest x-ray showed no pneumonia and no frank pulmonary edema.  Next  Clinically I suspect patient has a COPD exacerbation.  Patient says that she has not filled her prescription of prednisone she was given yesterday for the COPD exacerbation.  She  reports that she is continued to smoke overnight.  Next  Patient was offered admission given her persistent tachycardia and wheezing after several breathing treatments, steroids, and magnesium.  She says that she would rather go home and does not want to be admitted.  Next  Given the patient's recurrence episodes and her continued shortness of breath wheezing, we are recommending admission.  Patient will be leaving [facility] as she does not want to be admitted.  Patient advised that she should return if she decides to change her mind or gets worsening shortness of breath.  Patient understood the risks of worsening respiratory status including death.  Patient no other worsens or concerns and left AMA in stable condition.   Final Clinical Impressions(s) / ED Diagnoses   Final diagnoses:  COPD exacerbation [facility])    ED Discharge Orders    None      Clinical Impression: 1. COPD exacerbation ([place])     Disposition: AMA  Condition: stable   New Prescriptions   No medications on file    Follow Up: [name], PA-C [place] [address] [place] [address] [id number] mc [place]       Kimberlye Dilger, Gwenyth Allegra, MD 08/26/17 [id number]

## 2017-08-26 NOTE — ED Notes (Signed)
Bed: HO88 Expected date:  Expected time:  Means of arrival:  Comments: COPD

## 2017-08-26 NOTE — ED Triage Notes (Signed)
Pt brought by [facility] due to shortness of breath. This is pt's third hospital visit in the past 24 hours. Pt had not taken prescribed med from previous discharge. Per EMS, wheezing and rales heard in all lung fields.   20 G L AC 125 Solumedrol 2 Duoneb treatments.

## 2017-08-26 NOTE — ED Notes (Signed)
Pt yelling for pain medicine- states if "I don't get it, I might as well leave"  Pt's prosthetic leg is off, and across room.

## 2017-08-26 NOTE — H&P (Signed)
TRH H&P   Patient Demographics:    [name], is a 52 y.o. female  MRN: [id number]   DOB - 22-Jul-1965  Admit Date - 08/26/2017  Outpatient Primary MD for the patient is [name], PA-C  Referring MD/NP/PA: Quintella Reichert  Outpatient Specialists:     Patient coming from: home  Chief Complaint  Patient presents with  . Shortness of Breath      HPI:    [name]  is a 52 y.o. female, w Copd on home o2 3LNC, apparently c/o dyspnea over the past 2-3 days, along with cough with white sputum.  Pt denies fever, chills, cp, palp, n/v, diarrhea, brbpr, black stool, dysuria, hematuria.   Pt presented earlier today and went home and became more sob and therefore returned to ED.  Pt pt was not able to obtain her prednisone.    In ED,  CXR IMPRESSION: Cardiomegaly. Chronic mild bronchitic change of the lungs. No active pulmonary disease.   Trop 0.03  ABG:  Ph 7.433, Pco2 33.4,  Po2 45.0 Lactic acid 1.94 Wbc 13.5, Hgb 13.9, Plt 250 Na 141, K 4.7,  Bun 26, Creatinine 1.27 Ast 18, Alt 20 Glucose 449   BNP 157 (prev 722.2)  Pt will be admitted for Copd exacerbation, pt requires hopsitalization due to failure with outpatient therapy and need for iv steroids as well as iv abx.    Review of systems:    In addition to the HPI above, No Fever-chills, No Headache, No changes with Vision or hearing, No problems swallowing food or Liquids, No Chest pain No Abdominal pain, No Nausea or Vommitting, Bowel movements are regular, No Blood in stool or Urine, No dysuria, No new skin rashes or bruises, No new joints pains-aches,  No new weakness, tingling, numbness in any extremity, No recent weight gain or loss, No polyuria, polydypsia or polyphagia, No significant Mental Stressors.  A full 10 point Review of Systems was done, except as stated above, all other  Review of Systems were negative.   With Past History of the following :    Past Medical History:  Diagnosis Date  . Acute metabolic encephalopathy 0/32/1224  . Acute on chronic respiratory failure with hypoxia ([place]) 05/19/2016  . Anxiety   . ARF (acute renal failure) ([place]) 05/03/2014  . Arthritis    LEFT HIP  . Asthma    HOSPITALIZED WITH EXCERBATION OF ASTHMA - AND BRONCHITIS AND THE FLU DEC 2013  . Asthma exacerbation attacks 12/28/2011  . Bronchitis 12/28/2011  . CAP (community acquired pneumonia) 07/25/2012  . Chronic kidney disease (CKD), stage IV (severe) ([place]) 11/10/2014  . Class 3 obesity due to excess calories with serious comorbidity and body mass index (BMI) of 40.0 to 44.9 in adult   . Colles' fracture of left radius   . Depression   . Diabetes mellitus    ON INSULIN AND  ORAL MEDICATIONS  . Diabetes mellitus type 2 with complications, uncontrolled ([place]) 04/19/2008   Qualifier: Diagnosis of  By: Tomma Lightning MD, Claiborne Billings     . Diabetic foot infection ([place]) 11/09/2014  . Diabetic ketoacidosis without coma associated with type 2 diabetes mellitus ([place])   . Diabetic neuropathy ([place]) 11/10/2014  . Diarrhea 08/30/2008   Qualifier: Diagnosis of  By: Tomma Lightning MD, Claiborne Billings    . Diastolic dysfunction 29/07/9890  . Difficult intravenous access   . DKA, type 2 ([place]) 04/04/2016  . Dyslipidemia 12/23/2008   Qualifier: Diagnosis of  By: Tomma Lightning MD, Claiborne Billings    . Essential hypertension 04/19/2008   Qualifier: Diagnosis of  By: Tomma Lightning MD, Claiborne Billings    . FATIGUE 07/12/2008   Qualifier: Diagnosis of  By: Tomma Lightning MD, Claiborne Billings    . Femoral neck fracture ([place]) 07/16/2011  . Femur fracture, left ([place]) 07/08/2014  . Fever   . Fracture of distal femur ([place]) 07/08/2014  . GERD (gastroesophageal reflux disease)   . Hereditary and idiopathic peripheral neuropathy 04/19/2008   Qualifier: Diagnosis of  By: Tomma Lightning MD, Claiborne Billings    . Hip fracture, left ([place]) 07/16/2011  . Hip pain   . HIV infection ([place]) 2000  . Human  immunodeficiency virus (HIV) disease ([place]) 04/19/2008   [id number] +   . Hyperlipidemia   . Hypertension   . Hyponatremia 12/29/2011  . Influenza A 12/29/2011  . Influenza B   . Insomnia 06/12/2016  . Left hip pain 05/03/2014  . Mood disorder ([place]) 04/19/2008   Qualifier: Diagnosis of  By: Tomma Lightning MD, Claiborne Billings    . Neuropathy    NEUROPATHY HANDS AND FEET  . NSTEMI (non-ST elevated myocardial infarction) ([place]) 05/19/2016  . Obesity hypoventilation syndrome ([place])   . Osteoporosis 07/08/2014  . Pain    SEVERE PAIN LEFT HIP - HX OF LEFT HIP PINNING JULY 2013  . Perimenopausal symptoms 02/13/2012   LMP around 2011. On estrace and provera since around 2012 for hot flashes, mood swings. Estrace 2 mg daily, Provera 2.5 mg for 5 days each month.   . Repeated falls 07/30/2011  . Sepsis ([place]) 04/04/2016  . Shortness of breath    ALLERGIES ARE "ACTING UP"  . SOB (shortness of breath)   . THRUSH 05/20/2008   Qualifier: Diagnosis of  By: Tomma Lightning MD, Claiborne Billings      . Tobacco use disorder 04/14/2010      Past Surgical History:  Procedure Laterality Date  . AMPUTATION Left 11/18/2014   Procedure: LEFT BELOW KNEE AMPUTATION;  Surgeon: Leandrew Koyanagi, MD;  Location: [facility];  Service: Orthopedics;  Laterality: Left;  . CAST APPLICATION Left 01/02/9415   Procedure: CAST APPLICATION (FIBERGLASS);  Surgeon: Latanya Maudlin, MD;  Location: [facility];  Service: Orthopedics;  Laterality: Left;  CLOSED REDUCTION OF LEFT COLLES FRACTURE WITH SHORT ARM CAST  . HARDWARE REMOVAL Left 05/05/2014   Procedure: HARDWARE REMOVAL LEFT HIP;  Surgeon: Latanya Maudlin, MD;  Location: [facility];  Service: Orthopedics;  Laterality: Left;  REMOVAL BIOMET 6.5-8.0 CANNULATED SCREW  . HIP ARTHROPLASTY Left 05/05/2014   Procedure: ARTHROPLASTY OPEN REDUCTION INTERNAL FIXATION LEFT HIP AND REMOVAL OF TWO CANNULATED SCREW;  Surgeon: Latanya Maudlin, MD;  Location: [facility];  Service: Orthopedics;  Laterality: Left;  . HIP PINNING,CANNULATED  07/16/2011   Procedure:  CANNULATED HIP PINNING;  Surgeon: Gearlean Alf, MD;  [facility]: [facility];  Service: Orthopedics;  Laterality: Left;  . HIP PINNING,CANNULATED Left 04/09/2012   Procedure: CANNULATED HIP PINNING AND HARDWARE  REVISION;  Surgeon: Gearlean Alf, MD;  Location: [facility];  Service: Orthopedics;  Laterality: Left;  . I&D EXTREMITY Left 11/16/2014   Procedure:  DEBRIDEMENT OF LEFT FOOT POSSIBLE APPLICATION OF INTEGRIA AND VAC ;  Surgeon: Irene Limbo, MD;  Location: [facility];  Service: Plastics;  Laterality: Left;  . PERIPHERAL VASCULAR CATHETERIZATION N/A 11/15/2014   Procedure: Abdominal Aortogram;  Surgeon: Conrad St. Rose, MD;  Location: [facility];  Service: Cardiovascular;  Laterality: N/A;  . PERIPHERAL VASCULAR CATHETERIZATION  11/15/2014   Procedure: Lower Extremity Angiography;  Surgeon: Conrad Buchanan, MD;  Location: [facility];  Service: Cardiovascular;;  . PERIPHERAL VASCULAR CATHETERIZATION Left 11/15/2014   Procedure: Peripheral Vascular Intervention;  Surgeon: Conrad Springbrook, MD;  Location: [facility];  Service: Cardiovascular;  Laterality: Left;  popliteal artery stenting      Social History:     Social History   Tobacco Use  . Smoking status: Current Every Day Smoker    Packs/day: 0.50    Years: 35.00    Pack years: 17.50    Types: E-cigarettes, Cigarettes  . Smokeless tobacco: Never Used  Substance Use Topics  . Alcohol use: No    Alcohol/week: 0.0 standard drinks    Comment: no drink since 2008     Lives - at home by self  Mobility - walks by self   Family History :     Family History  Problem Relation Age of Onset  . Diabetes Mother   . Hypertension Mother   . Vision loss Mother   . Heart disease Mother   . Hypertension Father   . Thyroid disease Neg Hx        Home Medications:   Prior to Admission medications   Medication Sig Start Date End Date Taking? Authorizing Provider  albuterol (PROVENTIL HFA;VENTOLIN HFA) 108 (90 Base)  MCG/ACT inhaler INHALE 2 PUFFS INTO THE LUNGS EVERY 6 HOURS AS NEEDED FOR WHEEZING OR SHORTNESS OF BREATH Patient taking differently: Inhale 2 puffs into the lungs every 6 (six) hours as needed for wheezing or shortness of breath.  03/08/17  Yes Chesley Mires, MD  ARIPiprazole (ABILIFY) 15 MG tablet Take 15 mg by mouth daily.   Yes [provider]  BREO ELLIPTA 100-25 MCG/INH AEPB INHALE 1 PUFF INTO THE LUNGS DAILY Patient taking differently: Inhale 1 puff into the lungs daily.  05/29/17  Yes Chesley Mires, MD  clonazePAM (KLONOPIN) 0.5 MG tablet Take 0.5 mg by mouth 3 (three) times daily as needed for anxiety.   Yes [provider]  diphenoxylate-atropine (LOMOTIL) 2.5-0.025 MG tablet Take 1 tablet by mouth 4 (four) times daily as needed for diarrhea or loose stools. 11/20/16  Yes Campbell Riches, MD  doxepin (SINEQUAN) 10 MG capsule Take 10 mg by mouth at bedtime.  02/13/17  Yes [provider]  elvitegravir-cobicistat-emtricitabine-tenofovir (GENVOYA) 150-150-200-10 MG TABS tablet Take 1 tablet by mouth daily. 05/23/17  Yes Campbell Riches, MD  fenofibrate (TRICOR) 145 MG tablet Take 145 mg by mouth daily.   Yes [provider]  furosemide (LASIX) 80 MG tablet Take 80 mg by mouth daily. 02/10/17  Yes [provider]  HYDROcodone-acetaminophen (NORCO) 10-325 MG tablet Take 1 tablet by mouth every 8 (eight) hours as needed (pain).   Yes [provider]  Insulin Glargine (TOUJEO MAX SOLOSTAR) 300 UNIT/ML SOPN Inject 60 Units into the skin 2 (two) times daily. 05/29/17  Yes Elayne Snare, MD  insulin  regular human CONCENTRATED (HUMULIN R U-500 KWIKPEN) 500 UNIT/ML kwikpen INJECT 60 UNITS BEFORE BREAKFAST, 50 UNITS BEFORE LUNCH, AND 35 UNITS BEFORE DINNER. 05/29/17  Yes Elayne Snare, MD  liraglutide (VICTOZA) 18 MG/3ML SOPN Inject 0.3 mLs (1.8 mg total) into the skin daily. 05/29/17  Yes Elayne Snare, MD  MAGNESIUM-OXIDE 400 (241.3 Mg) MG tablet Take 400 mg  by mouth daily. 02/15/17  Yes [provider]  OXYGEN Inhale 3 L into the lungs continuous.   Yes [provider]  pantoprazole (PROTONIX) 40 MG tablet Take 40 mg by mouth daily. 09/02/14  Yes [provider]  predniSONE (DELTASONE) 20 MG tablet Take 2 tablets (40 mg total) by mouth daily. 08/25/17  Yes Virgel Manifold, MD  rosuvastatin (CRESTOR) 20 MG tablet Take 20 mg by mouth daily. Reported on 05/02/2015 09/02/14  Yes [provider]  SYMBICORT 80-4.5 MCG/ACT inhaler Inhale 1 puff into the lungs every 12 (twelve) hours as needed (SOB, wheezing).  02/26/17  Yes [provider]  temazepam (RESTORIL) 15 MG capsule Take 30 mg by mouth at bedtime as needed for sleep.  02/13/17  Yes [provider]  tiZANidine (ZANAFLEX) 4 MG tablet Take 4 mg by mouth every 8 (eight) hours.  03/06/16  Yes [provider]  NARCAN 4 MG/0.1ML LIQD nasal spray kit Place 1 spray into the nose as needed. overdose 02/01/17   [provider]  potassium chloride (K-DUR) 10 MEQ tablet Take 1 tablet (10 mEq total) by mouth daily. Patient not taking: Reported on 08/26/2017 02/26/17   Recardo Evangelist, PA-C  potassium chloride SA (K-DUR,KLOR-CON) 20 MEQ tablet Take 1 tablet (20 mEq total) by mouth daily. X 3 days, then check potassium levels if needed to continue Patient not taking: Reported on 08/26/2017 05/25/16   Mendel Corning, MD     Allergies:     Allergies  Allergen Reactions  . Cortizone-10 [Hydrocortisone] Rash    Per patient, given local injection at knee and developed rash at local site.      Physical Exam:   Vitals  Blood pressure (!) 149/80, pulse (!) 103, temperature 98 F (36.7 C), temperature source Oral, resp. rate 20, height 5' 10"  (1.778 m), weight 107.5 kg, last menstrual period 11/16/2010, SpO2 100 %.   1. General  lying in bed in NAD,    2. Normal affect and insight, Not Suicidal or Homicidal, Awake Alert, Oriented X 3.  3. No F.N  deficits, ALL C.Nerves Intact, Strength 5/5 all 4 extremities, Sensation intact all 4 extremities, Plantars down going.  4. Ears and Eyes appear Normal, Conjunctivae clear, PERRLA. Moist Oral Mucosa.  5. Supple Neck, No JVD, No cervical lymphadenopathy appriciated, No Carotid Bruits.  6. Symmetrical Chest wall movement, tight, bilateral wheezing, no crackles.   7. Tachy s1, s2,   8. Positive Bowel Sounds, Abdomen Soft, No tenderness, No organomegaly appriciated,No rebound -guarding or rigidity.  9.  No Cyanosis, Normal Skin Turgor, No Skin Rash or Bruise.  10. Good muscle tone,  joints appear normal , no effusions, Normal ROM.  11. No Palpable Lymph Nodes in Neck or Axillae   L BKA   Data Review:    CBC Recent Labs  Lab 08/25/17 2116 08/26/17 1032  WBC 14.2* 13.5*  HGB 14.2 13.9  HCT 44.3 45.4  PLT 250 250  MCV 91.7 91.7  MCH 29.4 28.1  MCHC 32.1 30.6  RDW 19.1* 19.5*  LYMPHSABS 1.6 0.8  MONOABS 1.0 0.2  EOSABS 0.1 0.0  BASOSABS 0.0 0.0   ------------------------------------------------------------------------------------------------------------------  Chemistries  Recent Labs  Lab 08/26/17 1032  NA 141  K 4.7  CL 99  CO2 26  GLUCOSE 449*  BUN 26*  CREATININE 1.27*  CALCIUM 9.6  AST 18  ALT 20  ALKPHOS 60  BILITOT 0.7   ------------------------------------------------------------------------------------------------------------------ estimated creatinine clearance is 68 mL/min (A) (by C-G formula based on SCr of 1.27 mg/dL (H)). ------------------------------------------------------------------------------------------------------------------ No results for input(s): TSH, T4TOTAL, T3FREE, THYROIDAB in the last 72 hours.  Invalid input(s): FREET3  Coagulation profile No results for input(s): INR, PROTIME in the last 168 hours. ------------------------------------------------------------------------------------------------------------------- No results  for input(s): DDIMER in the last 72 hours. -------------------------------------------------------------------------------------------------------------------  Cardiac Enzymes No results for input(s): CKMB, TROPONINI, MYOGLOBIN in the last 168 hours.  Invalid input(s): CK ------------------------------------------------------------------------------------------------------------------    Component Value Date/Time   BNP 157.0 (H) 08/26/2017 1008     ---------------------------------------------------------------------------------------------------------------  Urinalysis    Component Value Date/Time   COLORURINE YELLOW 03/02/2017 1634   APPEARANCEUR CLEAR 03/02/2017 1634   LABSPEC 1.030 03/02/2017 1634   PHURINE 6.0 03/02/2017 1634   GLUCOSEU >=500 (A) 03/02/2017 1634   GLUCOSEU 250 (A) 03/16/2015 1612   HGBUR NEGATIVE 03/02/2017 1634   BILIRUBINUR NEGATIVE 03/02/2017 1634   KETONESUR 20 (A) 03/02/2017 1634   PROTEINUR NEGATIVE 03/02/2017 1634   UROBILINOGEN 0.2 03/16/2015 1612   NITRITE NEGATIVE 03/02/2017 1634   LEUKOCYTESUR NEGATIVE 03/02/2017 1634    ----------------------------------------------------------------------------------------------------------------   Imaging Results:    Dg Chest 2 View  Result Date: 08/26/2017 CLINICAL DATA:  52 year old female with shortness of breath and asthma EXAM: CHEST - 2 VIEW COMPARISON:  Prior chest x-ray 08/25/2017 FINDINGS: Mild cardiomegaly, stable. Atherosclerotic calcifications are present in the transverse aorta. No mediastinal abnormality. No overt pulmonary edema, pleural effusion, pneumothorax or focal airspace consolidation. Chronic bronchitic changes are stable. No acute osseous abnormality. IMPRESSION: Stable cardiomegaly without evidence of acute cardiopulmonary process. Electronically Signed   By: Jacqulynn Cadet M.D.   On: 08/26/2017 10:38   Dg Chest Port 1 View  Result Date: 08/26/2017 CLINICAL DATA:  Asthmatic,  dyspnea and wheeze. EXAM: PORTABLE CHEST 1 VIEW COMPARISON:  08/26/2017 FINDINGS: AP portable semi upright view of the chest. Stable cardiomegaly. Chronic mild bronchitic change of the lungs. No overt pulmonary edema or pulmonary consolidations. Aortic arch is obscured due to overlap with the thoracic spine given patient's slight rotation. No acute osseous abnormality. IMPRESSION: Cardiomegaly. Chronic mild bronchitic change of the lungs. No active pulmonary disease. Electronically Signed   By: Ashley Royalty M.D.   On: 08/26/2017 21:32   Dg Chest Portable 1 View  Result Date: 08/25/2017 CLINICAL DATA:  Shortness of breath, wheezing EXAM: PORTABLE CHEST 1 VIEW COMPARISON:  03/02/2017 FINDINGS: Cardiomegaly. No overt edema. No confluent opacities or effusions. No acute bony abnormality. IMPRESSION: Cardiomegaly.  No active disease. Electronically Signed   By: Rolm Baptise M.D.   On: 08/25/2017 21:31       Assessment & Plan:    Principal Problem:   COPD exacerbation ([place]) Active Problems:   Human immunodeficiency virus (HIV) disease ([place])   Diabetes mellitus type 2 with complications, uncontrolled ([place])    Copd exacerbation Solumedrol 17m iv q8h Symbicort=> Dulera 2puff bid Albuterol neb q6h and q6h prn  incruse 1puff qday  Tachycardia Tele Trop iq6h x3 D dimer , if positive then VQ scan   Dm2 Cont Toujeo Cont regular insulin prior to meals Cont Victoza Fsbs ac and qhs, ISS  HIV Cont GPershing Proud  Cont PPI  Anxiety Cont clonazepam Cont Doxepin  Hyperlipidemia Cont  Crestor  CKD stage 3 Check cmp in am  DVT Prophylaxis Lovenox -  AM Labs Ordered, also please review Full Orders  Family Communication: Admission, patients condition and plan of care including tests being ordered have been discussed with the patient  who indicate understanding and agree with the plan and Code Status.  Code Status  DNR per pt  Likely DC to  home  Condition GUARDED    Consults  called: none  Admission status: observation   Time spent in minutes : 70 minutes  Pt will be admitted observation, for iv steroids, and iv abx for Copd exacerbation.  Depending upon how she progresses might need inpatient stay.    Jani Gravel M.D on 08/26/2017 at 11:26 PM  Between 7am to 7pm - Pager - [phone]  After 7pm go to [email or web address] - password [facility]  [facility] - Office  [phone]

## 2017-08-27 ENCOUNTER — Other Ambulatory Visit: Payer: Self-pay

## 2017-08-27 ENCOUNTER — Encounter (HOSPITAL_COMMUNITY): Payer: Self-pay

## 2017-08-27 DIAGNOSIS — J441 Chronic obstructive pulmonary disease with (acute) exacerbation: Secondary | ICD-10-CM

## 2017-08-27 DIAGNOSIS — G92 Toxic encephalopathy: Secondary | ICD-10-CM | POA: Diagnosis not present

## 2017-08-27 DIAGNOSIS — M6282 Rhabdomyolysis: Secondary | ICD-10-CM | POA: Diagnosis not present

## 2017-08-27 DIAGNOSIS — R55 Syncope and collapse: Secondary | ICD-10-CM | POA: Diagnosis not present

## 2017-08-27 LAB — CBG MONITORING, ED: GLUCOSE-CAPILLARY: 391 mg/dL — AB (ref 70–99)

## 2017-08-27 LAB — GLUCOSE, CAPILLARY
GLUCOSE-CAPILLARY: 154 mg/dL — AB (ref 70–99)
Glucose-Capillary: 249 mg/dL — ABNORMAL HIGH (ref 70–99)

## 2017-08-27 MED ORDER — FUROSEMIDE 80 MG PO TABS
80.0000 mg | ORAL_TABLET | Freq: Every day | ORAL | Status: DC
  Administered 2017-08-27: 80 mg via ORAL
  Filled 2017-08-27: qty 1

## 2017-08-27 MED ORDER — HYDROCODONE-ACETAMINOPHEN 10-325 MG PO TABS: 1.0000 | ORAL_TABLET | Freq: Three times a day (TID) | ORAL | Status: DC | PRN

## 2017-08-27 MED ORDER — TEMAZEPAM 15 MG PO CAPS
30.0000 mg | ORAL_CAPSULE | Freq: Every evening | ORAL | Status: DC | PRN
  Administered 2017-08-27: 30 mg via ORAL
  Filled 2017-08-27: qty 2

## 2017-08-27 MED ORDER — TIZANIDINE HCL 4 MG PO TABS
4.0000 mg | ORAL_TABLET | Freq: Three times a day (TID) | ORAL | Status: DC
  Administered 2017-08-27: 4 mg via ORAL
  Filled 2017-08-27 (×3): qty 1

## 2017-08-27 MED ORDER — INSULIN GLARGINE 100 UNIT/ML ~~LOC~~ SOLN
60.0000 [IU] | Freq: Two times a day (BID) | SUBCUTANEOUS | Status: DC
  Administered 2017-08-27 (×2): 60 [IU] via SUBCUTANEOUS
  Filled 2017-08-27 (×3): qty 0.6

## 2017-08-27 MED ORDER — ELVITEG-COBIC-EMTRICIT-TENOFAF 150-150-200-10 MG PO TABS
1.0000 | ORAL_TABLET | Freq: Every day | ORAL | Status: DC
  Administered 2017-08-27: 1 via ORAL
  Filled 2017-08-27: qty 1

## 2017-08-27 MED ORDER — INSULIN REGULAR HUMAN (CONC) 500 UNIT/ML ~~LOC~~ SOPN: 35.0000 [IU] | PEN_INJECTOR | Freq: Three times a day (TID) | SUBCUTANEOUS | Status: DC

## 2017-08-27 MED ORDER — INSULIN ASPART 100 UNIT/ML ~~LOC~~ SOLN
20.0000 [IU] | Freq: Three times a day (TID) | SUBCUTANEOUS | Status: DC
  Administered 2017-08-27 (×3): 20 [IU] via SUBCUTANEOUS

## 2017-08-27 MED ORDER — ROSUVASTATIN CALCIUM 10 MG PO TABS
20.0000 mg | ORAL_TABLET | Freq: Every day | ORAL | Status: DC
  Administered 2017-08-27: 20 mg via ORAL
  Filled 2017-08-27: qty 2

## 2017-08-27 MED ORDER — MOMETASONE FURO-FORMOTEROL FUM 100-5 MCG/ACT IN AERO
2.0000 | INHALATION_SPRAY | Freq: Two times a day (BID) | RESPIRATORY_TRACT | Status: DC
  Administered 2017-08-27: 2 via RESPIRATORY_TRACT
  Filled 2017-08-27: qty 8.8

## 2017-08-27 MED ORDER — AZITHROMYCIN 250 MG PO TABS: 250.0000 mg | ORAL_TABLET | Freq: Every day | ORAL | 0 refills | Status: DC

## 2017-08-27 MED ORDER — LIRAGLUTIDE 18 MG/3ML ~~LOC~~ SOPN: 1.8000 mg | PEN_INJECTOR | Freq: Every day | SUBCUTANEOUS | Status: DC

## 2017-08-27 MED ORDER — DIPHENOXYLATE-ATROPINE 2.5-0.025 MG PO TABS: 1.0000 | ORAL_TABLET | Freq: Four times a day (QID) | ORAL | Status: DC | PRN

## 2017-08-27 MED ORDER — ARIPIPRAZOLE 5 MG PO TABS
15.0000 mg | ORAL_TABLET | Freq: Every day | ORAL | Status: DC
  Administered 2017-08-27: 15 mg via ORAL
  Filled 2017-08-27: qty 3

## 2017-08-27 MED ORDER — DOXEPIN HCL 10 MG PO CAPS
10.0000 mg | ORAL_CAPSULE | Freq: Every day | ORAL | Status: DC
  Administered 2017-08-27: 10 mg via ORAL
  Filled 2017-08-27: qty 1

## 2017-08-27 MED ORDER — PANTOPRAZOLE SODIUM 40 MG PO TBEC
40.0000 mg | DELAYED_RELEASE_TABLET | Freq: Every day | ORAL | Status: DC
  Administered 2017-08-27: 40 mg via ORAL
  Filled 2017-08-27: qty 1

## 2017-08-27 MED ORDER — CLONAZEPAM 0.5 MG PO TABS
0.5000 mg | ORAL_TABLET | Freq: Three times a day (TID) | ORAL | Status: DC | PRN
  Administered 2017-08-27: 0.5 mg via ORAL
  Filled 2017-08-27: qty 1

## 2017-08-27 MED ORDER — UMECLIDINIUM BROMIDE 62.5 MCG/INH IN AEPB
1.0000 | INHALATION_SPRAY | Freq: Every day | RESPIRATORY_TRACT | Status: DC
  Administered 2017-08-27: 1 via RESPIRATORY_TRACT
  Filled 2017-08-27: qty 7

## 2017-08-27 MED ORDER — PREDNISONE 20 MG PO TABS: 40.0000 mg | ORAL_TABLET | Freq: Every day | ORAL | 0 refills | Status: DC

## 2017-08-27 NOTE — Progress Notes (Signed)
Pt. Is being discharged to home via ambulance with all their belongings per physician order.  IV was removed by pt this morning 0945. Site is clean, dry, and intact. Cannula was intact.  I reviewed discharge medications and instructions with patient and answered all questions. Instructed pt to follow up with physician.

## 2017-08-27 NOTE — Discharge Instructions (Signed)
Chronic Obstructive Pulmonary Disease Exacerbation  Chronic obstructive pulmonary disease (COPD) is a common lung problem. In COPD, the flow of air from the lungs is limited. COPD exacerbations are times that breathing gets worse and you need extra treatment. Without treatment they can be life threatening. If they happen often, your lungs can become more damaged. If your COPD gets worse, your doctor may treat you with:  ? Medicines.  ? Oxygen.  ? Different ways to clear your airway, such as using a mask.    Follow these instructions at home:  ? Do not smoke.  ? Avoid tobacco smoke and other things that bother your lungs.  ? If given, take your antibiotic medicine as told. Finish the medicine even if you start to feel better.  ? Only take medicines as told by your doctor.  ? Drink enough fluids to keep your pee (urine) clear or pale yellow (unless your doctor has told you not to).  ? Use a cool mist machine (vaporizer).  ? If you use oxygen or a machine that turns liquid medicine into a mist (nebulizer), continue to use them as told.  ? Keep up with shots (vaccinations) as told by your doctor.  ? Exercise regularly.  ? Eat healthy foods.  ? Keep all doctor visits as told.  Get help right away if:  ? You are very short of breath and it gets worse.  ? You have trouble talking.  ? You have bad chest pain.  ? You have blood in your spit (sputum).  ? You have a fever.  ? You keep throwing up (vomiting).  ? You feel weak, or you pass out (faint).  ? You feel confused.  ? You keep getting worse.  This information is not intended to replace advice given to you by your health care provider. Make sure you discuss any questions you have with your health care provider.  Document Released: 12/07/2010 Document Revised: 05/26/2015 Document Reviewed: 08/22/2012  Elsevier Interactive Patient Education ? 2017 [employer]

## 2017-08-27 NOTE — ED Notes (Signed)
ED TO INPATIENT HANDOFF REPORT  Name/Age/Gender [name] 52 y.o. female  Code Status    Code Status Orders  (From admission, onward)         Start     Ordered   08/26/17 2319  Do not attempt resuscitation (DNR)  Continuous    Question Answer Comment  In the event of cardiac or respiratory ARREST Do not call a "code blue"   In the event of cardiac or respiratory ARREST Do not perform Intubation, CPR, defibrillation or ACLS   In the event of cardiac or respiratory ARREST Use medication by any route, position, wound care, and other measures to relive pain and suffering. May use oxygen, suction and manual treatment of airway obstruction as needed for comfort.      08/26/17 2318        Code Status History    Date Active Date Inactive Code Status Order ID Comments User Context   08/26/2017 2318 08/26/2017 2318 Full Code [id number]  Jani Gravel, MD ED   03/02/2017 1859 03/04/2017 1730 Full Code [id number]  Colbert Ewing, MD ED   03/02/2017 1859 03/02/2017 1859 Full Code [id number]  Colbert Ewing, MD ED   02/23/2017 2304 02/25/2017 1746 DNR [id number]  Elwin Mocha, MD ED   02/23/2017 2219 02/23/2017 2304 DNR [id number]  Merrily Pew, MD ED   09/03/2016 2341 09/04/2016 1617 Full Code [id number]  Rise Patience, MD ED   08/20/2016 0042 08/21/2016 2056 Full Code [id number]  Jani Gravel, MD ED   08/19/2016 0409 08/19/2016 1602 Full Code [id number]  Norval Morton, MD ED   06/19/2016 0224 06/20/2016 2041 Full Code [id number]  Gardiner Barefoot, NP Inpatient   06/18/2016 1739 06/19/2016 0224 Full Code [id number]  Doreatha Lew, MD ED   05/20/2016 0023 05/25/2016 2252 Full Code [id number]  Ivor Costa, MD ED   04/04/2016 2110 04/09/2016 2127 Full Code [id number]  Barton Dubois, MD Inpatient   11/15/2014 1445 11/22/2014 2055 Full Code [id number]  Conrad , MD Inpatient   11/10/2014 0159 11/15/2014 1445 Full Code [id number]  Florencia Reasons, MD Inpatient   07/08/2014 0749 07/09/2014 1920 Full Code [id number]   Susa Day, MD Inpatient   07/08/2014 0253 07/08/2014 0749 Full Code [id number]  Lavina Hamman, MD Inpatient   05/03/2014 0333 05/12/2014 2051 Full Code [id number]  Theressa Millard, MD Inpatient   07/25/2012 2136 07/31/2012 1728 Full Code [id number]  Orvan Falconer, MD Inpatient   04/09/2012 1645 04/10/2012 1552 Full Code [id number]  Gearlean Alf, MD Inpatient   07/16/2011 1723 07/21/2011 1924 Full Code [id number]  Hubert Azure, RN Inpatient   07/16/2011 0635 07/16/2011 1723 Full Code [id number]  Jackelyn Knife, RN Inpatient    Advance Directive Documentation     Most Recent Value  Type of Advance Directive  Living will  Pre-existing out of facility DNR order (yellow form or pink MOST form)  -  "MOST" Form in Place?  -      Home/SNF/Other Home  Chief Complaint Respiratory Distress  Level of Care/Admitting Diagnosis ED Disposition    ED Disposition Condition [place]: [place] [100100]  Level of Care: Telemetry [5]  Diagnosis: COPD exacerbation [facility]) [[id number]]  Admitting Physician: Jani Gravel [[id number]]  Attending Physician: Jani Gravel [[id number]]  PT Class (Do Not Modify): Observation [104]  PT Acc Code (Do Not Modify): Observation [10022]       Medical History Past Medical History:  Diagnosis Date  . Acute metabolic encephalopathy 2/58/5277  . Acute on chronic respiratory failure with hypoxia ([place]) 05/19/2016  . Anxiety   . ARF (acute renal failure) ([place]) 05/03/2014  . Arthritis    LEFT HIP  . Asthma    HOSPITALIZED WITH EXCERBATION OF ASTHMA - AND BRONCHITIS AND THE FLU DEC 2013  . Asthma exacerbation attacks 12/28/2011  . Bronchitis 12/28/2011  . CAP (community acquired pneumonia) 07/25/2012  . Chronic kidney disease (CKD), stage IV (severe) ([place]) 11/10/2014  . Class 3 obesity due to excess calories with serious comorbidity and body mass index (BMI) of 40.0 to 44.9 in adult   . Colles' fracture of left radius   . Depression   . Diabetes  mellitus    ON INSULIN AND ORAL MEDICATIONS  . Diabetes mellitus type 2 with complications, uncontrolled ([place]) 04/19/2008   Qualifier: Diagnosis of  By: Tomma Lightning MD, Claiborne Billings     . Diabetic foot infection ([place]) 11/09/2014  . Diabetic ketoacidosis without coma associated with type 2 diabetes mellitus ([place])   . Diabetic neuropathy ([place]) 11/10/2014  . Diarrhea 08/30/2008   Qualifier: Diagnosis of  By: Tomma Lightning MD, Claiborne Billings    . Diastolic dysfunction 82/04/2351  . Difficult intravenous access   . DKA, type 2 ([place]) 04/04/2016  . Dyslipidemia 12/23/2008   Qualifier: Diagnosis of  By: Tomma Lightning MD, Claiborne Billings    . Essential hypertension 04/19/2008   Qualifier: Diagnosis of  By: Tomma Lightning MD, Claiborne Billings    . FATIGUE 07/12/2008   Qualifier: Diagnosis of  By: Tomma Lightning MD, Claiborne Billings    . Femoral neck fracture ([place]) 07/16/2011  . Femur fracture, left ([place]) 07/08/2014  . Fever   . Fracture of distal femur ([place]) 07/08/2014  . GERD (gastroesophageal reflux disease)   . Hereditary and idiopathic peripheral neuropathy 04/19/2008   Qualifier: Diagnosis of  By: Tomma Lightning MD, Claiborne Billings    . Hip fracture, left ([place]) 07/16/2011  . Hip pain   . HIV infection ([place]) 2000  . Human immunodeficiency virus (HIV) disease ([place]) 04/19/2008   [id number] +   . Hyperlipidemia   . Hypertension   . Hyponatremia 12/29/2011  . Influenza A 12/29/2011  . Influenza B   . Insomnia 06/12/2016  . Left hip pain 05/03/2014  . Mood disorder ([place]) 04/19/2008   Qualifier: Diagnosis of  By: Tomma Lightning MD, Claiborne Billings    . Neuropathy    NEUROPATHY HANDS AND FEET  . NSTEMI (non-ST elevated myocardial infarction) ([place]) 05/19/2016  . Obesity hypoventilation syndrome ([place])   . Osteoporosis 07/08/2014  . Pain    SEVERE PAIN LEFT HIP - HX OF LEFT HIP PINNING JULY 2013  . Perimenopausal symptoms 02/13/2012   LMP around 2011. On estrace and provera since around 2012 for hot flashes, mood swings. Estrace 2 mg daily, Provera 2.5 mg for 5 days each month.   . Repeated falls 07/30/2011  . Sepsis  ([place]) 04/04/2016  . Shortness of breath    ALLERGIES ARE "ACTING UP"  . SOB (shortness of breath)   . THRUSH 05/20/2008   Qualifier: Diagnosis of  By: Tomma Lightning MD, Claiborne Billings      . Tobacco use disorder 04/14/2010    Allergies Allergies  Allergen Reactions  . Cortizone-10 [Hydrocortisone] Rash    Per patient, given local injection at knee and developed rash at local site.     IV Location/Drains/Wounds Patient Lines/Drains/Airways Status   Active Line/Drains/Airways    Name:   Placement date:   Placement time:   Site:  Days:   Peripheral IV 08/26/17 Left Antecubital   08/26/17    1931    Antecubital   1   Pressure Injury 03/04/17 Stage I -  Intact skin with non-blanchable redness of a localized area usually over a bony prominence.   03/04/17    0900     176          Labs/Imaging Results for orders placed or performed during the hospital encounter of 08/26/17 (from the past 48 hour(s))  Blood gas, venous     Status: Abnormal   Collection Time: 08/26/17  9:55 PM  Result Value Ref Range   pH, Ven 7.433 (H) 7.250 - 7.430   pCO2, Ven 33.4 (L) 44.0 - 60.0 mmHg   pO2, Ven 45.0 32.0 - 45.0 mmHg   Bicarbonate 22.0 20.0 - 28.0 mmol/L   Acid-base deficit 1.1 0.0 - 2.0 mmol/L   O2 Saturation 78.6 %   Patient temperature 98.6    Collection site VENOUS    Drawn by DRAWN BY RN    Sample type VENOUS     Comment: Performed at [facility], [place] [address] [place], [place] [address]  I-stat troponin, ED     Status: None   Collection Time: 08/26/17 10:00 PM  Result Value Ref Range   Troponin i, poc 0.03 0.00 - 0.08 ng/mL   Comment 3            Comment: Due to the release kinetics of cTnI, a negative result within the first hours of the onset of symptoms does not rule out myocardial infarction with certainty. If myocardial infarction is still suspected, repeat the test at appropriate intervals.    Dg Chest 2 View  Result Date: 08/26/2017 CLINICAL DATA:  52 year old  female with shortness of breath and asthma EXAM: CHEST - 2 VIEW COMPARISON:  Prior chest x-ray 08/25/2017 FINDINGS: Mild cardiomegaly, stable. Atherosclerotic calcifications are present in the transverse aorta. No mediastinal abnormality. No overt pulmonary edema, pleural effusion, pneumothorax or focal airspace consolidation. Chronic bronchitic changes are stable. No acute osseous abnormality. IMPRESSION: Stable cardiomegaly without evidence of acute cardiopulmonary process. Electronically Signed   By: Jacqulynn Cadet M.D.   On: 08/26/2017 10:38   Dg Chest Port 1 View  Result Date: 08/26/2017 CLINICAL DATA:  Asthmatic, dyspnea and wheeze. EXAM: PORTABLE CHEST 1 VIEW COMPARISON:  08/26/2017 FINDINGS: AP portable semi upright view of the chest. Stable cardiomegaly. Chronic mild bronchitic change of the lungs. No overt pulmonary edema or pulmonary consolidations. Aortic arch is obscured due to overlap with the thoracic spine given patient's slight rotation. No acute osseous abnormality. IMPRESSION: Cardiomegaly. Chronic mild bronchitic change of the lungs. No active pulmonary disease. Electronically Signed   By: Ashley Royalty M.D.   On: 08/26/2017 21:32   Dg Chest Portable 1 View  Result Date: 08/25/2017 CLINICAL DATA:  Shortness of breath, wheezing EXAM: PORTABLE CHEST 1 VIEW COMPARISON:  03/02/2017 FINDINGS: Cardiomegaly. No overt edema. No confluent opacities or effusions. No acute bony abnormality. IMPRESSION: Cardiomegaly.  No active disease. Electronically Signed   By: Rolm Baptise M.D.   On: 08/25/2017 21:31    Pending Labs Unresulted Labs (From admission, onward)    Start     Ordered   09/02/17 0500  Creatinine, serum  (enoxaparin (LOVENOX)    CrCl >/= 30 ml/min)  Weekly,   R    Comments:  while on enoxaparin therapy    08/26/17 2318   08/27/17 0500  Comprehensive metabolic panel  Tomorrow morning,   R     08/26/17 2318   08/27/17 0500  CBC  Tomorrow morning,   R     08/26/17 2318    08/26/17 2317  Troponin I  Now then every 6 hours,   R     08/26/17 2318   08/26/17 2317  D-dimer, quantitative (not at [facility])  Add-on,   R     08/26/17 2318          Vitals/Pain Today's Vitals   08/26/17 1944 08/26/17 2146 08/26/17 2221 08/26/17 2301  BP:  (!) 154/86 (!) 149/80   Pulse:  (!) 106 (!) 103   Resp:  13 20   Temp:      TempSrc:      SpO2:  94% 100%   Weight: 107.5 kg     Height: _0  (1.778 m)     PainSc:    0-No pain    Isolation Precautions No active isolations  Medications Medications  enoxaparin (LOVENOX) injection 40 mg (has no administration in time range)  sodium chloride flush (NS) 0.9 % injection 3 mL (3 mLs Intravenous Given 08/26/17 2347)  sodium chloride flush (NS) 0.9 % injection 3 mL (has no administration in time range)  0.9 %  sodium chloride infusion (has no administration in time range)  acetaminophen (TYLENOL) tablet 650 mg (has no administration in time range)    Or  acetaminophen (TYLENOL) suppository 650 mg (has no administration in time range)  insulin aspart (novoLOG) injection 0-9 Units (has no administration in time range)  insulin aspart (novoLOG) injection 0-5 Units (has no administration in time range)  azithromycin (ZITHROMAX) 500 mg in sodium chloride 0.9 % 250 mL IVPB (500 mg Intravenous New Bag/Given 08/26/17 2347)  methylPREDNISolone sodium succinate (SOLU-MEDROL) 125 mg/2 mL injection 80 mg (80 mg Intravenous Given 08/26/17 2342)  albuterol (PROVENTIL) (2.5 MG/3ML) 0.083% nebulizer solution 2.5 mg (has no administration in time range)  albuterol (PROVENTIL) (2.5 MG/3ML) 0.083% nebulizer solution 2.5 mg (has no administration in time range)  umeclidinium bromide (INCRUSE ELLIPTA) 62.5 MCG/INH 1 puff (has no administration in time range)  ipratropium-albuterol (DUONEB) 0.5-2.5 (3) MG/3ML nebulizer solution 3 mL (3 mLs Nebulization Given 08/26/17 2114)  HYDROcodone-acetaminophen (NORCO/VICODIN) 5-325 MG per tablet 2 tablet (2  tablets Oral Given 08/26/17 2115)  albuterol (PROVENTIL) (2.5 MG/3ML) 0.083% nebulizer solution 2.5 mg (2.5 mg Nebulization Given 08/26/17 2302)    Mobility walks with person assist

## 2017-08-27 NOTE — Progress Notes (Signed)
Patient will DC to: Home  Anticipated DC date: 08/27/17 Family notified: N/A Transport by: Corey Harold  Ambulance transport requested for patient.   CSW signing off.  Cedric Fishman, LCSW Clinical Social Worker [phone]

## 2017-08-27 NOTE — Discharge Summary (Signed)
Physician Discharge Summary  [name] BLT:[id number] DOB: 01-24-1965 DOA: 08/26/2017  PCP: [name], PA-C  Admit date: 08/26/2017 Discharge date: 08/27/2017  Admitted From: Home Disposition:  Home  Recommendations for Outpatient Follow-up:  1. Follow up with PCP in 1-2 weeks 2. Please obtain BMP/CBC in one week   Home Health: No Equipment/Devices: 2-3LNC  Discharge Condition: stable CODE STATUS: DNR Diet recommendation: Heart Healthy / Carb Modified   Brief/Interim Summary:  #) COPD exacerbation: Patient was admitted with COPD exacerbation.  She was given IV steroids and scheduled bronchodilators with resolution of her symptoms.  She was discharged home on oral prednisone and azithromycin.  She was continued on her home LABA/ICS/L AMA.  #) HIV: Patient was continued on home medications for this.  #) Type 2 diabetes on insulin: Patient was continued on home insulin.  She was told to check her blood sugars more frequently at home as she was being discharged on steroids.  Patient's home subcu liraglutide was held and can be restarted on discharge.  Her blood sugars were monitored here and she did well she was maintained on sliding scale.  #) Hyperlipidemia: Patient was continued on home statin and fenofibrate.  #)  chronic grade 2 diastolic heart failure: Patient's BNP on admission was reassuring.  She was not felt to be in heart failure.  She was continued on her home oral furosemide.  #) Psych/pain: Patient was continued on home aripiprazole 50 mg daily, clonazepam, doxepin, PRN Norco, temazepam, tizanidine.  Discharge Diagnoses:  Principal Problem:   COPD exacerbation ([place]) Active Problems:   Human immunodeficiency virus (HIV) disease ([place])   Diabetes mellitus type 2 with complications, uncontrolled ([place])    Discharge Instructions  Discharge Instructions    Call MD for:  difficulty breathing, headache or visual disturbances   Complete by:  As directed     Call MD for:  hives   Complete by:  As directed    Call MD for:  persistant dizziness or light-headedness   Complete by:  As directed    Call MD for:  persistant nausea and vomiting   Complete by:  As directed    Call MD for:  redness, tenderness, or signs of infection (pain, swelling, redness, odor or green/yellow discharge around incision site)   Complete by:  As directed    Call MD for:  severe uncontrolled pain   Complete by:  As directed    Call MD for:  temperature >100.4   Complete by:  As directed    Diet - low sodium heart healthy   Complete by:  As directed    Discharge instructions   Complete by:  As directed    Please take your steroids and antibiotics as prescribed.  Please follow-up with your primary care doctor in 1 week.   Increase activity slowly   Complete by:  As directed      Allergies as of 08/27/2017      Reactions   Cortizone-10 [hydrocortisone] Rash   Per patient, given local injection at knee and developed rash at local site.       Medication List    TAKE these medications   albuterol 108 (90 Base) MCG/ACT inhaler Commonly known as:  PROVENTIL HFA;VENTOLIN HFA INHALE 2 PUFFS INTO THE LUNGS EVERY 6 HOURS AS NEEDED FOR WHEEZING OR SHORTNESS OF BREATH What changed:  See the new instructions.   ARIPiprazole 15 MG tablet Commonly known as:  ABILIFY Take 15 mg by mouth daily.  azithromycin 250 MG tablet Commonly known as:  ZITHROMAX Take 1 tablet (250 mg total) by mouth daily for 2 days.   BREO ELLIPTA 100-25 MCG/INH Aepb Generic drug:  fluticasone furoate-vilanterol INHALE 1 PUFF INTO THE LUNGS DAILY What changed:  See the new instructions.   clonazePAM 0.5 MG tablet Commonly known as:  KLONOPIN Take 0.5 mg by mouth 3 (three) times daily as needed for anxiety.   diphenoxylate-atropine 2.5-0.025 MG tablet Commonly known as:  LOMOTIL Take 1 tablet by mouth 4 (four) times daily as needed for diarrhea or loose stools.   doxepin 10 MG  capsule Commonly known as:  SINEQUAN Take 10 mg by mouth at bedtime.   elvitegravir-cobicistat-emtricitabine-tenofovir 150-150-200-10 MG Tabs tablet Commonly known as:  GENVOYA Take 1 tablet by mouth daily.   fenofibrate 145 MG tablet Commonly known as:  TRICOR Take 145 mg by mouth daily.   furosemide 80 MG tablet Commonly known as:  LASIX Take 80 mg by mouth daily.   HYDROcodone-acetaminophen 10-325 MG tablet Commonly known as:  NORCO Take 1 tablet by mouth every 8 (eight) hours as needed (pain).   Insulin Glargine 300 UNIT/ML Sopn Inject 60 Units into the skin 2 (two) times daily.   insulin regular human CONCENTRATED 500 UNIT/ML kwikpen Commonly known as:  HUMULIN R INJECT 60 UNITS BEFORE BREAKFAST, 50 UNITS BEFORE LUNCH, AND 35 UNITS BEFORE DINNER.   liraglutide 18 MG/3ML Sopn Commonly known as:  VICTOZA Inject 0.3 mLs (1.8 mg total) into the skin daily.   MAGNESIUM-OXIDE 400 (241.3 Mg) MG tablet Generic drug:  magnesium oxide Take 400 mg by mouth daily.   NARCAN 4 MG/0.1ML Liqd nasal spray kit Generic drug:  naloxone Place 1 spray into the nose as needed. overdose   OXYGEN Inhale 3 L into the lungs continuous.   pantoprazole 40 MG tablet Commonly known as:  PROTONIX Take 40 mg by mouth daily.   potassium chloride 10 MEQ tablet Commonly known as:  K-DUR Take 1 tablet (10 mEq total) by mouth daily.   potassium chloride SA 20 MEQ tablet Commonly known as:  K-DUR,KLOR-CON Take 1 tablet (20 mEq total) by mouth daily. X 3 days, then check potassium levels if needed to continue   predniSONE 20 MG tablet Commonly known as:  DELTASONE Take 2 tablets (40 mg total) by mouth daily for 4 days.   rosuvastatin 20 MG tablet Commonly known as:  CRESTOR Take 20 mg by mouth daily. Reported on 05/02/2015   SYMBICORT 80-4.5 MCG/ACT inhaler Generic drug:  budesonide-formoterol Inhale 1 puff into the lungs every 12 (twelve) hours as needed (SOB, wheezing).   temazepam 15  MG capsule Commonly known as:  RESTORIL Take 30 mg by mouth at bedtime as needed for sleep.   tiZANidine 4 MG tablet Commonly known as:  ZANAFLEX Take 4 mg by mouth every 8 (eight) hours.       Allergies  Allergen Reactions  . Cortizone-10 [Hydrocortisone] Rash    Per patient, given local injection at knee and developed rash at local site.     Consultations:  None   Procedures/Studies: Dg Chest 2 View  Result Date: 08/26/2017 CLINICAL DATA:  52 year old female with shortness of breath and asthma EXAM: CHEST - 2 VIEW COMPARISON:  Prior chest x-ray 08/25/2017 FINDINGS: Mild cardiomegaly, stable. Atherosclerotic calcifications are present in the transverse aorta. No mediastinal abnormality. No overt pulmonary edema, pleural effusion, pneumothorax or focal airspace consolidation. Chronic bronchitic changes are stable. No acute osseous abnormality. IMPRESSION: Stable cardiomegaly  without evidence of acute cardiopulmonary process. Electronically Signed   By: Jacqulynn Cadet M.D.   On: 08/26/2017 10:38   Dg Chest Port 1 View  Result Date: 08/26/2017 CLINICAL DATA:  Asthmatic, dyspnea and wheeze. EXAM: PORTABLE CHEST 1 VIEW COMPARISON:  08/26/2017 FINDINGS: AP portable semi upright view of the chest. Stable cardiomegaly. Chronic mild bronchitic change of the lungs. No overt pulmonary edema or pulmonary consolidations. Aortic arch is obscured due to overlap with the thoracic spine given patient's slight rotation. No acute osseous abnormality. IMPRESSION: Cardiomegaly. Chronic mild bronchitic change of the lungs. No active pulmonary disease. Electronically Signed   By: Ashley Royalty M.D.   On: 08/26/2017 21:32   Dg Chest Portable 1 View  Result Date: 08/25/2017 CLINICAL DATA:  Shortness of breath, wheezing EXAM: PORTABLE CHEST 1 VIEW COMPARISON:  03/02/2017 FINDINGS: Cardiomegaly. No overt edema. No confluent opacities or effusions. No acute bony abnormality. IMPRESSION: Cardiomegaly.  No  active disease. Electronically Signed   By: Rolm Baptise M.D.   On: 08/25/2017 21:31      Subjective:   Discharge Exam: Vitals:   08/27/17 0832 08/27/17 0835  BP:    Pulse:    Resp:    Temp:    SpO2: 95% 97%   Vitals:   08/27/17 0500 08/27/17 0743 08/27/17 0832 08/27/17 0835  BP:  106/65    Pulse:  93    Resp:  (!) 22    Temp:  98.2 F (36.8 C)    TempSrc:  Oral    SpO2:  99% 95% 97%  Weight: 107.1 kg     Height:        General: Pt is alert, awake, not in acute distress, older than stated age, chronically ill-appearing Cardiovascular: Distant heart sounds, regular rate and rhythm, no murmurs Respiratory: No increased work of breathing, scattered wheezes, no rhonchi or crackles Abdominal: Soft, NT, ND, bowel sounds + Extremities: Trace lower extremity edema, numerous contusions and friable skin noted over extremities    The results of significant diagnostics from this hospitalization (including imaging, microbiology, ancillary and laboratory) are listed below for reference.     Microbiology: No results found for this or any previous visit (from the past 240 hour(s)).   Labs: BNP (last 3 results) Recent Labs    02/23/17 1736 08/26/17 1008  BNP 722.2* 720.9*   Basic Metabolic Panel: Recent Labs  Lab 08/26/17 1032  NA 141  K 4.7  CL 99  CO2 26  GLUCOSE 449*  BUN 26*  CREATININE 1.27*  CALCIUM 9.6   Liver Function Tests: Recent Labs  Lab 08/26/17 1032  AST 18  ALT 20  ALKPHOS 60  BILITOT 0.7  PROT 6.3*  ALBUMIN 3.2*   Recent Labs  Lab 08/26/17 1032  LIPASE 25   No results for input(s): AMMONIA in the last 168 hours. CBC: Recent Labs  Lab 08/25/17 2116 08/26/17 1032  WBC 14.2* 13.5*  NEUTROABS 11.6* 12.3*  HGB 14.2 13.9  HCT 44.3 45.4  MCV 91.7 91.7  PLT 250 250   Cardiac Enzymes: No results for input(s): CKTOTAL, CKMB, CKMBINDEX, TROPONINI in the last 168 hours. BNP: Invalid input(s): POCBNP CBG: Recent Labs  Lab  08/27/17 0022 08/27/17 0725  GLUCAP 391* 249*   D-Dimer No results for input(s): DDIMER in the last 72 hours. Hgb A1c No results for input(s): HGBA1C in the last 72 hours. Lipid Profile No results for input(s): CHOL, HDL, LDLCALC, TRIG, CHOLHDL, LDLDIRECT in the last 72 hours. Thyroid  function studies No results for input(s): TSH, T4TOTAL, T3FREE, THYROIDAB in the last 72 hours.  Invalid input(s): FREET3 Anemia work up No results for input(s): VITAMINB12, FOLATE, FERRITIN, TIBC, IRON, RETICCTPCT in the last 72 hours. Urinalysis    Component Value Date/Time   COLORURINE YELLOW 03/02/2017 [place] 03/02/2017 1634   LABSPEC 1.030 03/02/2017 1634   PHURINE 6.0 03/02/2017 1634   GLUCOSEU >=500 (A) 03/02/2017 1634   GLUCOSEU 250 (A) 03/16/2015 1612   HGBUR NEGATIVE 03/02/2017 1634   BILIRUBINUR NEGATIVE 03/02/2017 1634   KETONESUR 20 (A) 03/02/2017 1634   PROTEINUR NEGATIVE 03/02/2017 1634   UROBILINOGEN 0.2 03/16/2015 1612   NITRITE NEGATIVE 03/02/2017 1634   LEUKOCYTESUR NEGATIVE 03/02/2017 1634   Sepsis Labs Invalid input(s): PROCALCITONIN,  WBC,  LACTICIDVEN Microbiology No results found for this or any previous visit (from the past 240 hour(s)).   Time coordinating discharge: 35  SIGNED:   Cristy Folks, MD  [facility] 08/27/2017, 9:09 AM   If 7PM-7AM, please contact night-coverage [email or web address] Password [email or web address]

## 2017-08-29 ENCOUNTER — Encounter (HOSPITAL_COMMUNITY): Payer: Self-pay | Admitting: Emergency Medicine

## 2017-08-29 ENCOUNTER — Inpatient Hospital Stay (HOSPITAL_COMMUNITY)
Admission: EM | Admit: 2017-08-29 | Discharge: 2017-09-09 | DRG: 312 | Disposition: A | Discharge status: Skilled Nursing Facility | Payer: Medicare HMO | Attending: Internal Medicine | Admitting: Internal Medicine

## 2017-08-29 ENCOUNTER — Emergency Department (HOSPITAL_COMMUNITY): Payer: Medicare HMO

## 2017-08-29 DIAGNOSIS — J440 Chronic obstructive pulmonary disease with acute lower respiratory infection: Secondary | ICD-10-CM | POA: Diagnosis present

## 2017-08-29 DIAGNOSIS — J449 Chronic obstructive pulmonary disease, unspecified: Secondary | ICD-10-CM | POA: Diagnosis not present

## 2017-08-29 DIAGNOSIS — R55 Syncope and collapse: Secondary | ICD-10-CM | POA: Diagnosis present

## 2017-08-29 DIAGNOSIS — I10 Essential (primary) hypertension: Secondary | ICD-10-CM

## 2017-08-29 DIAGNOSIS — I499 Cardiac arrhythmia, unspecified: Secondary | ICD-10-CM | POA: Diagnosis present

## 2017-08-29 DIAGNOSIS — G92 Toxic encephalopathy: Secondary | ICD-10-CM | POA: Diagnosis present

## 2017-08-29 DIAGNOSIS — G9340 Encephalopathy, unspecified: Secondary | ICD-10-CM | POA: Diagnosis not present

## 2017-08-29 DIAGNOSIS — E872 Acidosis: Secondary | ICD-10-CM | POA: Diagnosis present

## 2017-08-29 DIAGNOSIS — Z720 Tobacco use: Secondary | ICD-10-CM | POA: Diagnosis not present

## 2017-08-29 DIAGNOSIS — E785 Hyperlipidemia, unspecified: Secondary | ICD-10-CM | POA: Diagnosis present

## 2017-08-29 DIAGNOSIS — IMO0002 Reserved for concepts with insufficient information to code with codable children: Secondary | ICD-10-CM

## 2017-08-29 DIAGNOSIS — F419 Anxiety disorder, unspecified: Secondary | ICD-10-CM | POA: Diagnosis present

## 2017-08-29 DIAGNOSIS — N183 Chronic kidney disease, stage 3 unspecified: Secondary | ICD-10-CM | POA: Diagnosis present

## 2017-08-29 DIAGNOSIS — F329 Major depressive disorder, single episode, unspecified: Secondary | ICD-10-CM | POA: Diagnosis present

## 2017-08-29 DIAGNOSIS — M6282 Rhabdomyolysis: Secondary | ICD-10-CM | POA: Diagnosis present

## 2017-08-29 DIAGNOSIS — Z21 Asymptomatic human immunodeficiency virus [HIV] infection status: Secondary | ICD-10-CM | POA: Diagnosis present

## 2017-08-29 DIAGNOSIS — E875 Hyperkalemia: Secondary | ICD-10-CM | POA: Diagnosis present

## 2017-08-29 DIAGNOSIS — J441 Chronic obstructive pulmonary disease with (acute) exacerbation: Secondary | ICD-10-CM | POA: Diagnosis present

## 2017-08-29 DIAGNOSIS — N179 Acute kidney failure, unspecified: Secondary | ICD-10-CM | POA: Diagnosis present

## 2017-08-29 DIAGNOSIS — B964 Proteus (mirabilis) (morganii) as the cause of diseases classified elsewhere: Secondary | ICD-10-CM | POA: Diagnosis present

## 2017-08-29 DIAGNOSIS — Z79891 Long term (current) use of opiate analgesic: Secondary | ICD-10-CM

## 2017-08-29 DIAGNOSIS — Z6841 Body Mass Index (BMI) 40.0 and over, adult: Secondary | ICD-10-CM | POA: Diagnosis not present

## 2017-08-29 DIAGNOSIS — Z794 Long term (current) use of insulin: Secondary | ICD-10-CM

## 2017-08-29 DIAGNOSIS — R945 Abnormal results of liver function studies: Secondary | ICD-10-CM | POA: Diagnosis present

## 2017-08-29 DIAGNOSIS — I152 Hypertension secondary to endocrine disorders: Secondary | ICD-10-CM | POA: Diagnosis present

## 2017-08-29 DIAGNOSIS — E1122 Type 2 diabetes mellitus with diabetic chronic kidney disease: Secondary | ICD-10-CM

## 2017-08-29 DIAGNOSIS — I5032 Chronic diastolic (congestive) heart failure: Secondary | ICD-10-CM | POA: Diagnosis present

## 2017-08-29 DIAGNOSIS — Z09 Encounter for follow-up examination after completed treatment for conditions other than malignant neoplasm: Secondary | ICD-10-CM

## 2017-08-29 DIAGNOSIS — Z9689 Presence of other specified functional implants: Secondary | ICD-10-CM

## 2017-08-29 DIAGNOSIS — J209 Acute bronchitis, unspecified: Secondary | ICD-10-CM | POA: Diagnosis not present

## 2017-08-29 DIAGNOSIS — E118 Type 2 diabetes mellitus with unspecified complications: Secondary | ICD-10-CM

## 2017-08-29 DIAGNOSIS — E870 Hyperosmolality and hypernatremia: Secondary | ICD-10-CM | POA: Diagnosis present

## 2017-08-29 DIAGNOSIS — F17210 Nicotine dependence, cigarettes, uncomplicated: Secondary | ICD-10-CM | POA: Diagnosis present

## 2017-08-29 DIAGNOSIS — Z716 Tobacco abuse counseling: Secondary | ICD-10-CM

## 2017-08-29 DIAGNOSIS — E860 Dehydration: Secondary | ICD-10-CM

## 2017-08-29 DIAGNOSIS — M810 Age-related osteoporosis without current pathological fracture: Secondary | ICD-10-CM | POA: Diagnosis present

## 2017-08-29 DIAGNOSIS — G4700 Insomnia, unspecified: Secondary | ICD-10-CM | POA: Diagnosis present

## 2017-08-29 DIAGNOSIS — I447 Left bundle-branch block, unspecified: Secondary | ICD-10-CM | POA: Diagnosis present

## 2017-08-29 DIAGNOSIS — Z821 Family history of blindness and visual loss: Secondary | ICD-10-CM

## 2017-08-29 DIAGNOSIS — N3000 Acute cystitis without hematuria: Secondary | ICD-10-CM | POA: Diagnosis not present

## 2017-08-29 DIAGNOSIS — Z79899 Other long term (current) drug therapy: Secondary | ICD-10-CM

## 2017-08-29 DIAGNOSIS — D72829 Elevated white blood cell count, unspecified: Secondary | ICD-10-CM | POA: Diagnosis not present

## 2017-08-29 DIAGNOSIS — N3091 Cystitis, unspecified with hematuria: Secondary | ICD-10-CM | POA: Diagnosis present

## 2017-08-29 DIAGNOSIS — E1165 Type 2 diabetes mellitus with hyperglycemia: Secondary | ICD-10-CM | POA: Diagnosis present

## 2017-08-29 DIAGNOSIS — E662 Morbid (severe) obesity with alveolar hypoventilation: Secondary | ICD-10-CM | POA: Diagnosis present

## 2017-08-29 DIAGNOSIS — R296 Repeated falls: Secondary | ICD-10-CM | POA: Diagnosis present

## 2017-08-29 DIAGNOSIS — E876 Hypokalemia: Secondary | ICD-10-CM | POA: Diagnosis present

## 2017-08-29 DIAGNOSIS — K219 Gastro-esophageal reflux disease without esophagitis: Secondary | ICD-10-CM | POA: Diagnosis present

## 2017-08-29 DIAGNOSIS — E1140 Type 2 diabetes mellitus with diabetic neuropathy, unspecified: Secondary | ICD-10-CM | POA: Diagnosis present

## 2017-08-29 DIAGNOSIS — B20 Human immunodeficiency virus [HIV] disease: Secondary | ICD-10-CM | POA: Diagnosis not present

## 2017-08-29 DIAGNOSIS — Z89512 Acquired absence of left leg below knee: Secondary | ICD-10-CM

## 2017-08-29 DIAGNOSIS — F39 Unspecified mood [affective] disorder: Secondary | ICD-10-CM | POA: Diagnosis present

## 2017-08-29 DIAGNOSIS — E871 Hypo-osmolality and hyponatremia: Secondary | ICD-10-CM | POA: Diagnosis present

## 2017-08-29 DIAGNOSIS — G894 Chronic pain syndrome: Secondary | ICD-10-CM | POA: Diagnosis present

## 2017-08-29 DIAGNOSIS — W19XXXA Unspecified fall, initial encounter: Secondary | ICD-10-CM | POA: Diagnosis present

## 2017-08-29 DIAGNOSIS — Z8249 Family history of ischemic heart disease and other diseases of the circulatory system: Secondary | ICD-10-CM

## 2017-08-29 DIAGNOSIS — Z9981 Dependence on supplemental oxygen: Secondary | ICD-10-CM

## 2017-08-29 DIAGNOSIS — E1159 Type 2 diabetes mellitus with other circulatory complications: Secondary | ICD-10-CM | POA: Diagnosis present

## 2017-08-29 DIAGNOSIS — T380X5A Adverse effect of glucocorticoids and synthetic analogues, initial encounter: Secondary | ICD-10-CM | POA: Diagnosis present

## 2017-08-29 DIAGNOSIS — E1142 Type 2 diabetes mellitus with diabetic polyneuropathy: Secondary | ICD-10-CM | POA: Diagnosis present

## 2017-08-29 DIAGNOSIS — F17290 Nicotine dependence, other tobacco product, uncomplicated: Secondary | ICD-10-CM | POA: Diagnosis present

## 2017-08-29 DIAGNOSIS — N189 Chronic kidney disease, unspecified: Secondary | ICD-10-CM | POA: Diagnosis not present

## 2017-08-29 DIAGNOSIS — I130 Hypertensive heart and chronic kidney disease with heart failure and stage 1 through stage 4 chronic kidney disease, or unspecified chronic kidney disease: Secondary | ICD-10-CM | POA: Diagnosis present

## 2017-08-29 DIAGNOSIS — I252 Old myocardial infarction: Secondary | ICD-10-CM

## 2017-08-29 DIAGNOSIS — E11649 Type 2 diabetes mellitus with hypoglycemia without coma: Secondary | ICD-10-CM | POA: Diagnosis not present

## 2017-08-29 DIAGNOSIS — Z833 Family history of diabetes mellitus: Secondary | ICD-10-CM

## 2017-08-29 HISTORY — DX: Chronic obstructive pulmonary disease, unspecified: J44.9

## 2017-08-29 LAB — I-STAT ARTERIAL BLOOD GAS, ED
Acid-base deficit: 4 mmol/L — ABNORMAL HIGH (ref 0.0–2.0)
Bicarbonate: 21.7 mmol/L (ref 20.0–28.0)
O2 SAT: 97 %
PH ART: 7.327 — AB (ref 7.350–7.450)
TCO2: 23 mmol/L (ref 22–32)
pCO2 arterial: 41.4 mmHg (ref 32.0–48.0)
pO2, Arterial: 99 mmHg (ref 83.0–108.0)

## 2017-08-29 LAB — I-STAT CG4 LACTIC ACID, ED: Lactic Acid, Venous: 1.6 mmol/L (ref 0.5–1.9)

## 2017-08-29 LAB — COMPREHENSIVE METABOLIC PANEL
ALT: 63 U/L — ABNORMAL HIGH (ref 0–44)
AST: 75 U/L — ABNORMAL HIGH (ref 15–41)
Albumin: 3.2 g/dL — ABNORMAL LOW (ref 3.5–5.0)
Alkaline Phosphatase: 86 U/L (ref 38–126)
Anion gap: 22 — ABNORMAL HIGH (ref 5–15)
BUN: 83 mg/dL — AB (ref 6–20)
CHLORIDE: 106 mmol/L (ref 98–111)
CO2: 22 mmol/L (ref 22–32)
Calcium: 9.6 mg/dL (ref 8.9–10.3)
Creatinine, Ser: 2.04 mg/dL — ABNORMAL HIGH (ref 0.44–1.00)
GFR calc Af Amer: 31 mL/min — ABNORMAL LOW (ref >60)
GFR, EST NON AFRICAN AMERICAN: 27 mL/min — AB (ref >60)
Glucose, Bld: 109 mg/dL — ABNORMAL HIGH (ref 70–99)
POTASSIUM: 4.7 mmol/L (ref 3.5–5.1)
SODIUM: 150 mmol/L — AB (ref 135–145)
Total Bilirubin: 2 mg/dL — ABNORMAL HIGH (ref 0.3–1.2)
Total Protein: 6.6 g/dL (ref 6.5–8.1)

## 2017-08-29 LAB — CBC WITH DIFFERENTIAL/PLATELET
Abs Immature Granulocytes: 0.2 10*3/uL — ABNORMAL HIGH (ref 0.0–0.1)
BASOS ABS: 0 10*3/uL (ref 0.0–0.1)
BASOS PCT: 0 %
Eosinophils Absolute: 0 10*3/uL (ref 0.0–0.7)
Eosinophils Relative: 0 %
HCT: 51.8 % — ABNORMAL HIGH (ref 36.0–46.0)
Hemoglobin: 16.2 g/dL — ABNORMAL HIGH (ref 12.0–15.0)
Immature Granulocytes: 1 %
Lymphocytes Relative: 7 %
Lymphs Abs: 1.1 10*3/uL (ref 0.7–4.0)
MCH: 28.2 pg (ref 26.0–34.0)
MCHC: 31.3 g/dL (ref 30.0–36.0)
MCV: 90.2 fL (ref 78.0–100.0)
Monocytes Absolute: 0.8 10*3/uL (ref 0.1–1.0)
Monocytes Relative: 5 %
NEUTROS PCT: 87 %
Neutro Abs: 13.4 10*3/uL — ABNORMAL HIGH (ref 1.7–7.7)
PLATELETS: 306 10*3/uL (ref 150–400)
RBC: 5.74 MIL/uL — AB (ref 3.87–5.11)
RDW: 20 % — AB (ref 11.5–15.5)
WBC: 15.4 10*3/uL — AB (ref 4.0–10.5)

## 2017-08-29 LAB — I-STAT TROPONIN, ED: TROPONIN I, POC: 0.08 ng/mL (ref 0.00–0.08)

## 2017-08-29 LAB — MRSA PCR SCREENING: MRSA BY PCR: NEGATIVE

## 2017-08-29 LAB — GLUCOSE, CAPILLARY
GLUCOSE-CAPILLARY: 120 mg/dL — AB (ref 70–99)
GLUCOSE-CAPILLARY: 126 mg/dL — AB (ref 70–99)

## 2017-08-29 LAB — BRAIN NATRIURETIC PEPTIDE: B NATRIURETIC PEPTIDE 5: 69.9 pg/mL (ref 0.0–100.0)

## 2017-08-29 LAB — CK: CK TOTAL: 788 U/L — AB (ref 38–234)

## 2017-08-29 MED ORDER — PANTOPRAZOLE SODIUM 40 MG PO TBEC
40.0000 mg | DELAYED_RELEASE_TABLET | Freq: Every day | ORAL | Status: DC
  Administered 2017-08-30 – 2017-09-09 (×11): 40 mg via ORAL
  Filled 2017-08-29 (×11): qty 1

## 2017-08-29 MED ORDER — SODIUM CHLORIDE 0.9% FLUSH
3.0000 mL | Freq: Two times a day (BID) | INTRAVENOUS | Status: DC
  Administered 2017-08-29 – 2017-09-09 (×19): 3 mL via INTRAVENOUS

## 2017-08-29 MED ORDER — SODIUM CHLORIDE 0.45 % IV SOLN
INTRAVENOUS | Status: DC
  Administered 2017-08-29: 19:00:00 via INTRAVENOUS

## 2017-08-29 MED ORDER — SODIUM CHLORIDE 0.9 % IV SOLN
Freq: Once | INTRAVENOUS | Status: AC
  Administered 2017-08-29: 15:00:00 via INTRAVENOUS

## 2017-08-29 MED ORDER — SODIUM CHLORIDE 0.9 % IV SOLN
250.0000 mL | INTRAVENOUS | Status: DC | PRN
  Administered 2017-09-07: 250 mL via INTRAVENOUS

## 2017-08-29 MED ORDER — ONDANSETRON HCL 4 MG PO TABS: 4.0000 mg | ORAL_TABLET | Freq: Four times a day (QID) | ORAL | Status: DC | PRN

## 2017-08-29 MED ORDER — ELVITEG-COBIC-EMTRICIT-TENOFAF 150-150-200-10 MG PO TABS
1.0000 | ORAL_TABLET | Freq: Every day | ORAL | Status: DC
  Administered 2017-08-30 – 2017-09-06 (×8): 1 via ORAL
  Filled 2017-08-29 (×8): qty 1

## 2017-08-29 MED ORDER — SODIUM CHLORIDE 0.9% FLUSH: 3.0000 mL | INTRAVENOUS | Status: DC | PRN

## 2017-08-29 MED ORDER — INSULIN ASPART 100 UNIT/ML ~~LOC~~ SOLN
0.0000 [IU] | Freq: Three times a day (TID) | SUBCUTANEOUS | Status: DC
  Administered 2017-08-30: 1 [IU] via SUBCUTANEOUS
  Administered 2017-08-30 – 2017-08-31 (×2): 9 [IU] via SUBCUTANEOUS

## 2017-08-29 MED ORDER — ACETAMINOPHEN 325 MG PO TABS
650.0000 mg | ORAL_TABLET | Freq: Four times a day (QID) | ORAL | Status: DC | PRN
  Administered 2017-08-29: 650 mg via ORAL
  Filled 2017-08-29: qty 2

## 2017-08-29 MED ORDER — FENOFIBRATE 160 MG PO TABS
160.0000 mg | ORAL_TABLET | Freq: Every day | ORAL | Status: DC
  Administered 2017-08-29 – 2017-09-09 (×12): 160 mg via ORAL
  Filled 2017-08-29 (×12): qty 1

## 2017-08-29 MED ORDER — ARIPIPRAZOLE 5 MG PO TABS
15.0000 mg | ORAL_TABLET | Freq: Every day | ORAL | Status: DC
  Administered 2017-08-30 – 2017-09-09 (×11): 15 mg via ORAL
  Filled 2017-08-29 (×11): qty 1

## 2017-08-29 MED ORDER — SODIUM CHLORIDE 0.9 % IV BOLUS
1000.0000 mL | Freq: Once | INTRAVENOUS | Status: AC
  Administered 2017-08-29: 1000 mL via INTRAVENOUS

## 2017-08-29 MED ORDER — ONDANSETRON HCL 4 MG/2ML IJ SOLN
4.0000 mg | Freq: Four times a day (QID) | INTRAMUSCULAR | Status: DC | PRN
  Administered 2017-08-31 – 2017-09-03 (×2): 4 mg via INTRAVENOUS
  Filled 2017-08-29 (×2): qty 2

## 2017-08-29 MED ORDER — HEPARIN SODIUM (PORCINE) 5000 UNIT/ML IJ SOLN
5000.0000 [IU] | Freq: Three times a day (TID) | INTRAMUSCULAR | Status: DC
  Administered 2017-08-29 – 2017-09-09 (×32): 5000 [IU] via SUBCUTANEOUS
  Filled 2017-08-29 (×32): qty 1

## 2017-08-29 MED ORDER — DOXEPIN HCL 10 MG PO CAPS
10.0000 mg | ORAL_CAPSULE | Freq: Every day | ORAL | Status: DC
  Administered 2017-08-29 – 2017-09-08 (×11): 10 mg via ORAL
  Filled 2017-08-29 (×13): qty 1

## 2017-08-29 MED ORDER — MAGNESIUM OXIDE 400 (241.3 MG) MG PO TABS
400.0000 mg | ORAL_TABLET | Freq: Every day | ORAL | Status: DC
  Administered 2017-08-30 – 2017-09-09 (×11): 400 mg via ORAL
  Filled 2017-08-29 (×11): qty 1

## 2017-08-29 MED ORDER — IPRATROPIUM-ALBUTEROL 0.5-2.5 (3) MG/3ML IN SOLN
3.0000 mL | Freq: Four times a day (QID) | RESPIRATORY_TRACT | Status: DC | PRN
  Administered 2017-08-29: 3 mL via RESPIRATORY_TRACT
  Filled 2017-08-29: qty 3

## 2017-08-29 MED ORDER — TEMAZEPAM 15 MG PO CAPS
30.0000 mg | ORAL_CAPSULE | Freq: Every evening | ORAL | Status: DC | PRN
  Administered 2017-08-29 – 2017-08-31 (×3): 30 mg via ORAL
  Filled 2017-08-29 (×3): qty 2

## 2017-08-29 MED ORDER — CLONAZEPAM 0.5 MG PO TABS
0.5000 mg | ORAL_TABLET | Freq: Three times a day (TID) | ORAL | Status: DC | PRN
  Administered 2017-08-29 – 2017-09-01 (×6): 0.5 mg via ORAL
  Filled 2017-08-29 (×6): qty 1

## 2017-08-29 MED ORDER — ACETAMINOPHEN 650 MG RE SUPP: 650.0000 mg | Freq: Four times a day (QID) | RECTAL | Status: DC | PRN

## 2017-08-29 MED ORDER — MORPHINE SULFATE (PF) 2 MG/ML IV SOLN
1.0000 mg | INTRAVENOUS | Status: DC | PRN
  Administered 2017-08-29 – 2017-09-03 (×13): 1 mg via INTRAVENOUS
  Filled 2017-08-29 (×14): qty 1

## 2017-08-29 MED ORDER — ADULT MULTIVITAMIN W/MINERALS CH
1.0000 | ORAL_TABLET | Freq: Every day | ORAL | Status: DC
  Administered 2017-08-29 – 2017-09-09 (×12): 1 via ORAL
  Filled 2017-08-29 (×12): qty 1

## 2017-08-29 NOTE — ED Notes (Signed)
Per admitting, provide PRN morphine and klonopin and reassess patient in 30 minutes

## 2017-08-29 NOTE — ED Triage Notes (Signed)
Per [facility]:  Patient presents to ED for assessment after having a fall due to SOB 3 days ago.  Patient AxOx3 on EMS arrival.  Patient states syncopal episode.  EMS states wheezing en route, given 5 albuterol.  Pt normally on 3L [place], just discharged prior to fall with new medications for SOB with prednisone and azithromycin.

## 2017-08-29 NOTE — ED Notes (Signed)
Pt gave this RN permission to update daughter via phone with results

## 2017-08-29 NOTE — ED Notes (Signed)
Pt transported to CT ?

## 2017-08-29 NOTE — ED Notes (Signed)
Made Kim, RN on unit aware that Admitting spoke to patient, CP denied x 3, and okay to send upstairs.

## 2017-08-29 NOTE — ED Notes (Signed)
Respiratory made aware of need for ABG

## 2017-08-29 NOTE — ED Notes (Signed)
Admitting paged and made aware of patient's unresolved CP

## 2017-08-29 NOTE — ED Notes (Signed)
RT at bedside.

## 2017-08-29 NOTE — ED Notes (Signed)
Pt continues to be in radiology 

## 2017-08-29 NOTE — ED Provider Notes (Addendum)
[facility] EMERGENCY DEPARTMENT Provider Note   CSN: [id number] Arrival date & time: 08/29/17  1058     History   Chief Complaint Chief Complaint  Patient presents with  . Fall  . Shortness of Breath    HPI [name] is a 52 y.o. female.  HPI [name] is a 53 y.o. female history of diabetes, hypertension, COPD, HIV, CHF, presents to emergency department after an episode of fall.  Patient with recent admission, just discharged 3 days ago after COPD exacerbation.  States when she went home, at some point in time she thinks she had a syncopal episode and has not been able to get off the floor.  She thinks that she has been on the floor for 3 days.  She states that she is having pain to the right hip, right knee, shortness of breath.  She has not been able to take her medications in 3 days.  She reports headache.  She reports generalized malaise and weakness.  Patient's daughter-in-law found patient on the floor after she received a call from neighbors stating that they could not get in touch with her.  Patient received nebulizer treatment by EMS for wheezing and states she is feeling better.  She states she is having trouble moving the right hip.  She denies any back pain or abdominal pain.  No chest pain.  Denies any numbness or weakness in extremities.  She has no other complaints.  She does not remember what she was doing when she had a syncopal episode.  According to the daughter-in-law, patient was found to be naked on the floor near her bed.  Patient usually transfers to wheelchair due to a left BKA  Past Medical History:  Diagnosis Date  . Acute metabolic encephalopathy 01/20/1476  . Acute on chronic respiratory failure with hypoxia ([place]) 05/19/2016  . Anxiety   . ARF (acute renal failure) ([place]) 05/03/2014  . Arthritis    LEFT HIP  . Asthma    HOSPITALIZED WITH EXCERBATION OF ASTHMA - AND BRONCHITIS AND THE FLU DEC 2013  . Asthma exacerbation  attacks 12/28/2011  . Bronchitis 12/28/2011  . CAP (community acquired pneumonia) 07/25/2012  . Chronic kidney disease (CKD), stage IV (severe) ([place]) 11/10/2014  . Class 3 obesity due to excess calories with serious comorbidity and body mass index (BMI) of 40.0 to 44.9 in adult   . Colles' fracture of left radius   . Depression   . Diabetes mellitus    ON INSULIN AND ORAL MEDICATIONS  . Diabetes mellitus type 2 with complications, uncontrolled ([place]) 04/19/2008   Qualifier: Diagnosis of  By: Tomma Lightning MD, Claiborne Billings     . Diabetic foot infection ([place]) 11/09/2014  . Diabetic ketoacidosis without coma associated with type 2 diabetes mellitus ([place])   . Diabetic neuropathy ([place]) 11/10/2014  . Diarrhea 08/30/2008   Qualifier: Diagnosis of  By: Tomma Lightning MD, Claiborne Billings    . Diastolic dysfunction 29/05/6211  . Difficult intravenous access   . DKA, type 2 ([place]) 04/04/2016  . Dyslipidemia 12/23/2008   Qualifier: Diagnosis of  By: Tomma Lightning MD, Claiborne Billings    . Essential hypertension 04/19/2008   Qualifier: Diagnosis of  By: Tomma Lightning MD, Claiborne Billings    . FATIGUE 07/12/2008   Qualifier: Diagnosis of  By: Tomma Lightning MD, Claiborne Billings    . Femoral neck fracture ([place]) 07/16/2011  . Femur fracture, left ([place]) 07/08/2014  . Fever   . Fracture of distal femur ([place]) 07/08/2014  . GERD (gastroesophageal  reflux disease)   . Hereditary and idiopathic peripheral neuropathy 04/19/2008   Qualifier: Diagnosis of  By: Tomma Lightning MD, Claiborne Billings    . Hip fracture, left ([place]) 07/16/2011  . Hip pain   . HIV infection ([place]) 2000  . Human immunodeficiency virus (HIV) disease ([place]) 04/19/2008   [id number] +   . Hyperlipidemia   . Hypertension   . Hyponatremia 12/29/2011  . Influenza A 12/29/2011  . Influenza B   . Insomnia 06/12/2016  . Left hip pain 05/03/2014  . Mood disorder ([place]) 04/19/2008   Qualifier: Diagnosis of  By: Tomma Lightning MD, Claiborne Billings    . Neuropathy    NEUROPATHY HANDS AND FEET  . NSTEMI (non-ST elevated myocardial infarction) ([place]) 05/19/2016  . Obesity  hypoventilation syndrome ([place])   . Osteoporosis 07/08/2014  . Pain    SEVERE PAIN LEFT HIP - HX OF LEFT HIP PINNING JULY 2013  . Perimenopausal symptoms 02/13/2012   LMP around 2011. On estrace and provera since around 2012 for hot flashes, mood swings. Estrace 2 mg daily, Provera 2.5 mg for 5 days each month.   . Repeated falls 07/30/2011  . Sepsis ([place]) 04/04/2016  . Shortness of breath    ALLERGIES ARE "ACTING UP"  . SOB (shortness of breath)   . THRUSH 05/20/2008   Qualifier: Diagnosis of  By: Tomma Lightning MD, Claiborne Billings      . Tobacco use disorder 04/14/2010    Patient Active Problem List   Diagnosis Date Noted  . DKA (diabetic ketoacidoses) ([place]) 03/02/2017  . COPD exacerbation ([place]) 02/23/2017  . Acute on chronic respiratory failure with hypoxia and hypercapnia ([place]) 08/19/2016  . Leukocytosis 08/19/2016  . CKD (chronic kidney disease), stage III ([place]) 08/19/2016  . Altered mental status 08/19/2016  . Asthma exacerbation 06/18/2016  . Insomnia 06/12/2016  . Difficult intravenous access   . Acute on chronic respiratory failure with hypoxia ([place]) 05/19/2016  . NSTEMI (non-ST elevated myocardial infarction) ([place]) 05/19/2016  . Acute metabolic encephalopathy 95/28/4132  . Hyperlipidemia   . Class 3 obesity due to excess calories with serious comorbidity and body mass index (BMI) of 40.0 to 44.9 in adult   . Obesity hypoventilation syndrome ([place])   . Diastolic dysfunction 44/01/270  . Diabetic neuropathy ([place]) 11/10/2014  . Fracture of distal femur ([place]) 07/08/2014  . Osteoporosis 07/08/2014  . Femur fracture, left ([place]) 07/08/2014  . Colles' fracture of left radius   . Left hip pain 05/03/2014  . Perimenopausal symptoms 02/13/2012  . Hyponatremia 12/29/2011  . Asthma exacerbation attacks 12/28/2011  . Bronchitis 12/28/2011  . Repeated falls 07/30/2011  . Hip fracture, left ([place]) 07/16/2011  . Femoral neck fracture ([place]) 07/16/2011  . Tobacco abuse 04/14/2010  . Dyslipidemia  12/23/2008  . COPD with acute bronchitis ([place]) 11/29/2008  . THRUSH 05/20/2008  . Human immunodeficiency virus (HIV) disease ([place]) 04/19/2008  . Diabetes mellitus type 2 with complications, uncontrolled ([place]) 04/19/2008  . Mood disorder ([place]) 04/19/2008  . Hereditary and idiopathic peripheral neuropathy 04/19/2008  . Essential hypertension 04/19/2008    Past Surgical History:  Procedure Laterality Date  . AMPUTATION Left 11/18/2014   Procedure: LEFT BELOW KNEE AMPUTATION;  Surgeon: Leandrew Koyanagi, MD;  Location: [facility];  Service: Orthopedics;  Laterality: Left;  . CAST APPLICATION Left 05/04/6642   Procedure: CAST APPLICATION (FIBERGLASS);  Surgeon: Latanya Maudlin, MD;  Location: [facility];  Service: Orthopedics;  Laterality: Left;  CLOSED REDUCTION OF LEFT COLLES FRACTURE WITH SHORT ARM CAST  . HARDWARE REMOVAL Left  05/05/2014   Procedure: HARDWARE REMOVAL LEFT HIP;  Surgeon: Latanya Maudlin, MD;  Location: [facility];  Service: Orthopedics;  Laterality: Left;  REMOVAL BIOMET 6.5-8.0 CANNULATED SCREW  . HIP ARTHROPLASTY Left 05/05/2014   Procedure: ARTHROPLASTY OPEN REDUCTION INTERNAL FIXATION LEFT HIP AND REMOVAL OF TWO CANNULATED SCREW;  Surgeon: Latanya Maudlin, MD;  Location: [facility];  Service: Orthopedics;  Laterality: Left;  . HIP PINNING,CANNULATED  07/16/2011   Procedure: CANNULATED HIP PINNING;  Surgeon: Gearlean Alf, MD;  Location: [facility];  Service: Orthopedics;  Laterality: Left;  . HIP PINNING,CANNULATED Left 04/09/2012   Procedure: CANNULATED HIP PINNING AND HARDWARE REVISION;  Surgeon: Gearlean Alf, MD;  Location: [facility];  Service: Orthopedics;  Laterality: Left;  . I&D EXTREMITY Left 11/16/2014   Procedure:  DEBRIDEMENT OF LEFT FOOT POSSIBLE APPLICATION OF INTEGRIA AND VAC ;  Surgeon: Irene Limbo, MD;  Location: [facility];  Service: Plastics;  Laterality: Left;  . PERIPHERAL VASCULAR CATHETERIZATION N/A 11/15/2014   Procedure: Abdominal Aortogram;  Surgeon: Conrad [place], MD;  Location: [facility];  Service: Cardiovascular;  Laterality: N/A;  . PERIPHERAL VASCULAR CATHETERIZATION  11/15/2014   Procedure: Lower Extremity Angiography;  Surgeon: Conrad Salmon, MD;  Location: [facility];  Service: Cardiovascular;;  . PERIPHERAL VASCULAR CATHETERIZATION Left 11/15/2014   Procedure: Peripheral Vascular Intervention;  Surgeon: Conrad Am[place]ville, MD;  Location: [facility];  Service: Cardiovascular;  Laterality: Left;  popliteal artery stenting     OB History    Gravida  2   Para  1   Term  1   Preterm  0   AB  1   Living  1     SAB  1   TAB      Ectopic  0   Multiple  0   Live Births               Home Medications    Prior to Admission medications   Medication Sig Start Date End Date Taking? Authorizing Provider  albuterol (PROVENTIL HFA;VENTOLIN HFA) 108 (90 Base) MCG/ACT inhaler INHALE 2 PUFFS INTO THE LUNGS EVERY 6 HOURS AS NEEDED FOR WHEEZING OR SHORTNESS OF BREATH Patient taking differently: Inhale 2 puffs into the lungs every 6 (six) hours as needed for wheezing or shortness of breath.  03/08/17   Chesley Mires, MD  ARIPiprazole (ABILIFY) 15 MG tablet Take 15 mg by mouth daily.    [provider]  azithromycin (ZITHROMAX) 250 MG tablet Take 1 tablet (250 mg total) by mouth daily for 2 days. 08/27/17 08/29/17  Purohit, Konrad Dolores, MD  BREO ELLIPTA 100-25 MCG/INH AEPB INHALE 1 PUFF INTO THE LUNGS DAILY Patient taking differently: Inhale 1 puff into the lungs daily.  05/29/17   Chesley Mires, MD  clonazePAM (KLONOPIN) 0.5 MG tablet Take 0.5 mg by mouth 3 (three) times daily as needed for anxiety.    [provider]  diphenoxylate-atropine (LOMOTIL) 2.5-0.025 MG tablet Take 1 tablet by mouth 4 (four) times daily as needed for diarrhea or loose stools. 11/20/16   Campbell Riches, MD  doxepin (SINEQUAN) 10 MG capsule Take 10 mg by mouth at bedtime.  02/13/17   [provider]  elvitegravir-cobicistat-emtricitabine-tenofovir  (GENVOYA) 150-150-200-10 MG TABS tablet Take 1 tablet by mouth daily. 05/23/17   Campbell Riches, MD  fenofibrate (TRICOR) 145 MG tablet Take 145 mg by mouth daily.    [provider]  furosemide (LASIX) 80 MG tablet  Take 80 mg by mouth daily. 02/10/17   [provider]  HYDROcodone-acetaminophen (NORCO) 10-325 MG tablet Take 1 tablet by mouth every 8 (eight) hours as needed (pain).    [provider]  Insulin Glargine (TOUJEO MAX SOLOSTAR) 300 UNIT/ML SOPN Inject 60 Units into the skin 2 (two) times daily. 05/29/17   Elayne Snare, MD  insulin regular human CONCENTRATED (HUMULIN R U-500 KWIKPEN) 500 UNIT/ML kwikpen INJECT 60 UNITS BEFORE BREAKFAST, 50 UNITS BEFORE LUNCH, AND 35 UNITS BEFORE DINNER. 05/29/17   Elayne Snare, MD  liraglutide (VICTOZA) 18 MG/3ML SOPN Inject 0.3 mLs (1.8 mg total) into the skin daily. 05/29/17   Elayne Snare, MD  MAGNESIUM-OXIDE 400 (241.3 Mg) MG tablet Take 400 mg by mouth daily. 02/15/17   [provider]  NARCAN 4 MG/0.1ML LIQD nasal spray kit Place 1 spray into the nose as needed. overdose 02/01/17   [provider]  OXYGEN Inhale 3 L into the lungs continuous.    [provider]  pantoprazole (PROTONIX) 40 MG tablet Take 40 mg by mouth daily. 09/02/14   [provider]  potassium chloride (K-DUR) 10 MEQ tablet Take 1 tablet (10 mEq total) by mouth daily. Patient not taking: Reported on 08/26/2017 02/26/17   Recardo Evangelist, PA-C  potassium chloride SA (K-DUR,KLOR-CON) 20 MEQ tablet Take 1 tablet (20 mEq total) by mouth daily. X 3 days, then check potassium levels if needed to continue Patient not taking: Reported on 08/26/2017 05/25/16   Rai, Vernelle Emerald, MD  predniSONE (DELTASONE) 20 MG tablet Take 2 tablets (40 mg total) by mouth daily for 4 days. 08/27/17 08/31/17  Purohit, Konrad Dolores, MD  rosuvastatin (CRESTOR) 20 MG tablet Take 20 mg by mouth daily. Reported on 05/02/2015 09/02/14   [provider]  SYMBICORT  80-4.5 MCG/ACT inhaler Inhale 1 puff into the lungs every 12 (twelve) hours as needed (SOB, wheezing).  02/26/17   [provider]  temazepam (RESTORIL) 15 MG capsule Take 30 mg by mouth at bedtime as needed for sleep.  02/13/17   [provider]  tiZANidine (ZANAFLEX) 4 MG tablet Take 4 mg by mouth every 8 (eight) hours.  03/06/16   [provider]    Family History Family History  Problem Relation Age of Onset  . Diabetes Mother   . Hypertension Mother   . Vision loss Mother   . Heart disease Mother   . Hypertension Father   . Thyroid disease Neg Hx     Social History Social History   Tobacco Use  . Smoking status: Current Every Day Smoker    Packs/day: 0.50    Years: 35.00    Pack years: 17.50    Types: E-cigarettes, Cigarettes  . Smokeless tobacco: Never Used  Substance Use Topics  . Alcohol use: No    Alcohol/week: 0.0 standard drinks    Comment: no drink since 2008  . Drug use: No    Comment: hx of crack/cocaine, clean 8 years     Allergies   Cortizone-10 [hydrocortisone]   Review of Systems Review of Systems  Constitutional: Positive for fatigue. Negative for chills and fever.  Respiratory: Positive for shortness of breath. Negative for cough and chest tightness.   Cardiovascular: Negative for chest pain, palpitations and leg swelling.  Gastrointestinal: Negative for abdominal pain, diarrhea, nausea and vomiting.  Genitourinary: Negative for dysuria, flank pain, pelvic pain, vaginal bleeding, vaginal discharge and vaginal pain.  Musculoskeletal: Positive for arthralgias, joint swelling and myalgias. Negative for  neck pain and neck stiffness.  Skin: Negative for rash.  Neurological: Positive for dizziness, tremors, syncope, weakness, light-headedness and headaches.  All other systems reviewed and are negative.    Physical Exam Updated Vital Signs BP (!) 151/86   Pulse 100   Temp 98.3 F (36.8 C) (Rectal)   Resp 11   LMP  11/16/2010   SpO2 100%   Physical Exam  Constitutional: She is oriented to person, place, and time. She appears well-developed and well-nourished.  Skin appears to be cold diffusely  HENT:  Head: Normocephalic.  Eyes: Pupils are equal, round, and reactive to light. Conjunctivae and EOM are normal.  Neck: Normal range of motion. Neck supple.  Cardiovascular: Normal rate, regular rhythm and normal heart sounds.  Pulmonary/Chest: Effort normal and breath sounds normal. No respiratory distress. She has no wheezes. She has no rales.  Abdominal: Soft. Bowel sounds are normal. She exhibits no distension. There is no tenderness. There is no rebound.  Musculoskeletal: She exhibits no edema.  Tenderness to palpation right lateral hip joint.  Pain with any range of motion.  Swelling noted to the right knee with multiple skin abrasions.  Pain with range of motion of the knee, however full range of motion.  Joint appears to be stable with negative anterior posterior drawer signs.  No laxity with medial lateral stress.  DP pulses intact.  Neurological: She is alert and oriented to person, place, and time.  Skin: Skin is warm and dry.  Multiple abrasions to the right forearm, left forearm, right knee arm her right foot, and right hip.  Psychiatric: She has a normal mood and affect. Her behavior is normal.  Nursing note and vitals reviewed.    ED Treatments / Results  Labs (all labs ordered are listed, but only abnormal results are displayed) Labs Reviewed  CBC WITH DIFFERENTIAL/PLATELET - Abnormal; Notable for the following components:      Result Value   WBC 15.4 (*)    RBC 5.74 (*)    Hemoglobin 16.2 (*)    HCT 51.8 (*)    RDW 20.0 (*)    Neutro Abs 13.4 (*)    Abs Immature Granulocytes 0.2 (*)    All other components within normal limits  COMPREHENSIVE METABOLIC PANEL - Abnormal; Notable for the following components:   Sodium 150 (*)    Glucose, Bld 109 (*)    BUN 83 (*)    Creatinine,  Ser 2.04 (*)    Albumin 3.2 (*)    AST 75 (*)    ALT 63 (*)    Total Bilirubin 2.0 (*)    GFR calc non Af Amer 27 (*)    GFR calc Af Amer 31 (*)    Anion gap 22 (*)    All other components within normal limits  CK - Abnormal; Notable for the following components:   Total CK 788 (*)    All other components within normal limits  I-STAT ARTERIAL BLOOD GAS, ED - Abnormal; Notable for the following components:   pH, Arterial 7.327 (*)    Acid-base deficit 4.0 (*)    All other components within normal limits  BRAIN NATRIURETIC PEPTIDE  URINALYSIS, ROUTINE W REFLEX MICROSCOPIC  I-STAT TROPONIN, ED  I-STAT CG4 LACTIC ACID, ED    EKG EKG Interpretation  Date/Time:  Thursday August 29 2017 15:19:17 EDT Ventricular Rate:  97 PR Interval:    QRS Duration: 191 QT Interval:  440 QTC Calculation: 565 R Axis:   6  Text Interpretation:  Atrial fibrillation Left bundle branch block Artifact in lead(s) I aVR aVL and baseline wander in lead(s) V1 artifact. regular narrow complex unchanged Confirmed by Charlesetta Shanks [phone]) on 08/29/2017 4:34:22 PM   Radiology Dg Chest 2 View  Result Date: 08/29/2017 CLINICAL DATA:  Syncope.  Shortness of breath. EXAM: CHEST - 2 VIEW COMPARISON:  One-view chest x-ray 08/26/2017 FINDINGS: Heart is enlarged. The there is no edema or effusion. No focal airspace disease is present. Lung volumes are low. Exaggerated thoracic kyphosis is present. Remote right sixth and seventh rib fractures are again noted. IMPRESSION: 1. Stable cardiomegaly without failure. 2. Low lung volumes. Electronically Signed   By: San Morelle M.D.   On: 08/29/2017 12:21   Ct Head Wo Contrast  Result Date: 08/29/2017 CLINICAL DATA:  Syncope and fall 3 days ago.  Initial encounter. EXAM: CT HEAD WITHOUT CONTRAST CT CERVICAL SPINE WITHOUT CONTRAST TECHNIQUE: Multidetector CT imaging of the head and cervical spine was performed following the standard protocol without intravenous  contrast. Multiplanar CT image reconstructions of the cervical spine were also generated. COMPARISON:  None. FINDINGS: CT HEAD FINDINGS Brain: No evidence of acute infarction, hemorrhage, hydrocephalus, extra-axial collection or mass lesion/mass effect. Mild appearing chronic microvascular ischemic change is noted. Vascular: Atherosclerotic vascular disease is seen. Skull: No fracture or focal lesion. Sinuses/Orbits: Negative. Other: None. CT CERVICAL SPINE FINDINGS Alignment: Normal. Skull base and vertebrae: No acute fracture. No primary bone lesion or focal pathologic process. Soft tissues and spinal canal: No prevertebral fluid or swelling. No visible canal hematoma. Disc levels: Mild loss of disc space height and endplate spurring are seen at C5-6. Otherwise negative. Upper chest: Lung apices clear. Other: None. IMPRESSION: No acute abnormality head or cervical spine. Mild chronic microvascular ischemic change. Atherosclerosis. Mild degenerative disc disease C5-6. Electronically Signed   By: Inge Rise M.D.   On: 08/29/2017 12:11   Ct Cervical Spine Wo Contrast  Result Date: 08/29/2017 CLINICAL DATA:  Syncope and fall 3 days ago.  Initial encounter. EXAM: CT HEAD WITHOUT CONTRAST CT CERVICAL SPINE WITHOUT CONTRAST TECHNIQUE: Multidetector CT imaging of the head and cervical spine was performed following the standard protocol without intravenous contrast. Multiplanar CT image reconstructions of the cervical spine were also generated. COMPARISON:  None. FINDINGS: CT HEAD FINDINGS Brain: No evidence of acute infarction, hemorrhage, hydrocephalus, extra-axial collection or mass lesion/mass effect. Mild appearing chronic microvascular ischemic change is noted. Vascular: Atherosclerotic vascular disease is seen. Skull: No fracture or focal lesion. Sinuses/Orbits: Negative. Other: None. CT CERVICAL SPINE FINDINGS Alignment: Normal. Skull base and vertebrae: No acute fracture. No primary bone lesion or focal  pathologic process. Soft tissues and spinal canal: No prevertebral fluid or swelling. No visible canal hematoma. Disc levels: Mild loss of disc space height and endplate spurring are seen at C5-6. Otherwise negative. Upper chest: Lung apices clear. Other: None. IMPRESSION: No acute abnormality head or cervical spine. Mild chronic microvascular ischemic change. Atherosclerosis. Mild degenerative disc disease C5-6. Electronically Signed   By: Inge Rise M.D.   On: 08/29/2017 12:11   Dg Knee Complete 4 Views Right  Result Date: 08/29/2017 CLINICAL DATA:  Fall.  Knee pain. EXAM: RIGHT KNEE - COMPLETE 4+ VIEW COMPARISON:  None. FINDINGS: No evidence of fracture, dislocation, or joint effusion. No evidence of arthropathy or other focal bone abnormality. Vascular calcification. Soft tissues otherwise unremarkable. IMPRESSION: No fracture, dislocation, or effusion. Electronically Signed   By: Staci Righter M.D.   On: 08/29/2017 12:21  Dg Hip Unilat W Or Wo Pelvis 2-3 Views Right  Result Date: 08/29/2017 CLINICAL DATA:  Fall 3 days ago with right hip pain, initial encounter EXAM: DG HIP (WITH OR WITHOUT PELVIS) 3V RIGHT COMPARISON:  None. FINDINGS: The pelvic ring is intact. Severe degenerative changes of the hip joints are noted bilaterally. Postsurgical changes in the proximal left hip are noted. Osteopenia is noted as well. IMPRESSION: Chronic changes without acute abnormality. Electronically Signed   By: Inez Catalina M.D.   On: 08/29/2017 12:21    Procedures Procedures (including critical care time)  Medications Ordered in ED Medications  sodium chloride 0.9 % bolus 1,000 mL (0 mLs Intravenous Stopped 08/29/17 1506)  0.9 %  sodium chloride infusion ( Intravenous New Bag/Given 08/29/17 1506)     Initial Impression / Assessment and Plan / ED Course  I have reviewed the triage vital signs and the nursing notes.  Pertinent labs & imaging results that were available during my care of the patient  were reviewed by me and considered in my medical decision making (see chart for details).     Patient with possible syncopal episode 2 to 3 days ago, is unclear.  Has been on the floor for at least 2 days.  Has been without her medications.  Complaining of right hip and right knee pain.  She is alert and oriented.  She is mildly tachycardic, heart rate is around 100.  Normal blood pressure.  CBG was 100.  Will check labs, CK level, CT head and cervical spine, will get imaging of her hip and chest.  As well as the knee.  3:20 PM  Patient's x-rays are negative.  She is not complaining of anything right now.  She seems very somnolent, will get ABGs especially with her history of COPD.  She is also supposed to be on 3 L of oxygen which she is on right now but she was not on it while on the floor for the last 2 days.  CT head and cervical spine is negative.  Her creatinine and BUN are elevated as well as her hemoglobin and white blood cell count all concerning for dehydration.  She is receiving IV fluids.  I will admit her for further hydration.  I suspect she will need social work consult.  Patient daughter-in-law who found patient today and who can provide more information is Silvio Pate and can be reached at the number of [phone].   Vitals:   08/29/17 1245 08/29/17 1400 08/29/17 1415 08/29/17 1505  BP: (!) 144/95 (!) 152/87 (!) 160/92 133/87  Pulse: 94 92 95 97  Resp: 15 13 14    Temp:      TempSrc:      SpO2: 99% 97% 98% 99%     Final Clinical Impressions(s) / ED Diagnoses   Final diagnoses:  Syncope, unspecified syncope type  Dehydration    ED Discharge Orders    None       Jeannett Senior, PA-C 08/29/17 1613    Jeannett Senior, PA-C 08/29/17 1635    Charlesetta Shanks, MD 08/30/17 [phone]

## 2017-08-29 NOTE — ED Notes (Signed)
Purewick checked and no urine.

## 2017-08-29 NOTE — ED Notes (Signed)
While this RN on phone giving report patient called out yelling for help.  This RN went in to assess, and patient states pain in flank worsening and now having chest pain.  Patient rates all pain 10/10.  Patient expressed anxiety.  This RN to page admitting prior to transporting patient to unit

## 2017-08-29 NOTE — H&P (Addendum)
History and Physical    [name] MBW:[id number] DOB: 1965/08/11 DOA: 08/29/2017  PCP: [name], PA-C   Patient coming from: Home  Chief Complaint:  Syncope   HPI: [name] is a 52 y.o. female with medical history significant of COPD, type 2 diabetes mellitus, HIV, diastolic heart failure, dyslipidemia and chronic pain syndrome.  Patient was discharged from the hospital August 27 after being treated for COPD exacerbation.  Apparently she lives at home by herself.  She reported experiencing a syncope episode in her bedroom, unclear about prodromal symptoms, but apparently after recovering her consciousness she was not able to stand.  She had a left BKA and uses a prosthesis.  At the time of her fall she was not using the prosthesis.  She spent about 24 to 48 hours on the floor, until she was found by her daughter-in-law who brought her to the hospital.   History is limited due to patient is unable to recall the events that led her to her syncope episode.   ED Course: Patient was found dehydrated, hyponatremic, referred for admission for evaluation.  Review of Systems:  1. General: No fevers, no chills, no weight gain or weight loss 2. ENT: No runny nose or sore throat, no hearing disturbances 3. Pulmonary: No dyspnea, cough, wheezing, or hemoptysis 4. Cardiovascular: No angina, claudication, lower extremity edema, pnd or orthopnea 5. Gastrointestinal: No nausea or vomiting, no diarrhea or constipation 6. Hematology: No easy bruisability or frequent infections 7. Urology: No dysuria, hematuria or increased urinary frequency 8. Dermatology: No rashes. 9. Neurology: No seizures or paresthesias 10. Musculoskeletal: No joint pain or deformities/left BKA  Past Medical History:  Diagnosis Date  . Acute metabolic encephalopathy 0/17/7939  . Acute on chronic respiratory failure with hypoxia ([place]) 05/19/2016  . Anxiety   . ARF (acute renal failure) ([place]) 05/03/2014  .  Arthritis    LEFT HIP  . Asthma    HOSPITALIZED WITH EXCERBATION OF ASTHMA - AND BRONCHITIS AND THE FLU DEC 2013  . Asthma exacerbation attacks 12/28/2011  . Bronchitis 12/28/2011  . CAP (community acquired pneumonia) 07/25/2012  . Chronic kidney disease (CKD), stage IV (severe) ([place]) 11/10/2014  . Class 3 obesity due to excess calories with serious comorbidity and body mass index (BMI) of 40.0 to 44.9 in adult   . Colles' fracture of left radius   . COPD (chronic obstructive pulmonary disease) ([place])   . Depression   . Diabetes mellitus    ON INSULIN AND ORAL MEDICATIONS  . Diabetes mellitus type 2 with complications, uncontrolled ([place]) 04/19/2008   Qualifier: Diagnosis of  By: Tomma Lightning MD, Claiborne Billings     . Diabetic foot infection ([place]) 11/09/2014  . Diabetic ketoacidosis without coma associated with type 2 diabetes mellitus ([place])   . Diabetic neuropathy ([place]) 11/10/2014  . Diarrhea 08/30/2008   Qualifier: Diagnosis of  By: Tomma Lightning MD, Claiborne Billings    . Diastolic dysfunction 03/0/0923  . Difficult intravenous access   . DKA, type 2 ([place]) 04/04/2016  . Dyslipidemia 12/23/2008   Qualifier: Diagnosis of  By: Tomma Lightning MD, Claiborne Billings    . Essential hypertension 04/19/2008   Qualifier: Diagnosis of  By: Tomma Lightning MD, Claiborne Billings    . FATIGUE 07/12/2008   Qualifier: Diagnosis of  By: Tomma Lightning MD, Claiborne Billings    . Femoral neck fracture ([place]) 07/16/2011  . Femur fracture, left ([place]) 07/08/2014  . Fever   . Fracture of distal femur ([place]) 07/08/2014  . GERD (gastroesophageal reflux disease)   .  Hereditary and idiopathic peripheral neuropathy 04/19/2008   Qualifier: Diagnosis of  By: Tomma Lightning MD, Claiborne Billings    . Hip fracture, left ([place]) 07/16/2011  . Hip pain   . HIV infection ([place]) 2000  . Human immunodeficiency virus (HIV) disease ([place]) 04/19/2008   [id number] +   . Hyperlipidemia   . Hypertension   . Hyponatremia 12/29/2011  . Influenza A 12/29/2011  . Influenza B   . Insomnia 06/12/2016  . Left hip pain 05/03/2014  . Mood disorder ([place])  04/19/2008   Qualifier: Diagnosis of  By: Tomma Lightning MD, Claiborne Billings    . Neuropathy    NEUROPATHY HANDS AND FEET  . NSTEMI (non-ST elevated myocardial infarction) ([place]) 05/19/2016  . Obesity hypoventilation syndrome ([place])   . Osteoporosis 07/08/2014  . Pain    SEVERE PAIN LEFT HIP - HX OF LEFT HIP PINNING JULY 2013  . Perimenopausal symptoms 02/13/2012   LMP around 2011. On estrace and provera since around 2012 for hot flashes, mood swings. Estrace 2 mg daily, Provera 2.5 mg for 5 days each month.   . Repeated falls 07/30/2011  . Sepsis ([place]) 04/04/2016  . Shortness of breath    ALLERGIES ARE "ACTING UP"  . SOB (shortness of breath)   . THRUSH 05/20/2008   Qualifier: Diagnosis of  By: Tomma Lightning MD, Claiborne Billings      . Tobacco use disorder 04/14/2010    Past Surgical History:  Procedure Laterality Date  . AMPUTATION Left 11/18/2014   Procedure: LEFT BELOW KNEE AMPUTATION;  Surgeon: Leandrew Koyanagi, MD;  Location: [facility];  Service: Orthopedics;  Laterality: Left;  . CAST APPLICATION Left 01/06/1094   Procedure: CAST APPLICATION (FIBERGLASS);  Surgeon: Latanya Maudlin, MD;  Location: [facility];  Service: Orthopedics;  Laterality: Left;  CLOSED REDUCTION OF LEFT COLLES FRACTURE WITH SHORT ARM CAST  . HARDWARE REMOVAL Left 05/05/2014   Procedure: HARDWARE REMOVAL LEFT HIP;  Surgeon: Latanya Maudlin, MD;  Location: [facility];  Service: Orthopedics;  Laterality: Left;  REMOVAL BIOMET 6.5-8.0 CANNULATED SCREW  . HIP ARTHROPLASTY Left 05/05/2014   Procedure: ARTHROPLASTY OPEN REDUCTION INTERNAL FIXATION LEFT HIP AND REMOVAL OF TWO CANNULATED SCREW;  Surgeon: Latanya Maudlin, MD;  Location: [facility];  Service: Orthopedics;  Laterality: Left;  . HIP PINNING,CANNULATED  07/16/2011   Procedure: CANNULATED HIP PINNING;  Surgeon: Gearlean Alf, MD;  Location: [facility];  Service: Orthopedics;  Laterality: Left;  . HIP PINNING,CANNULATED Left 04/09/2012   Procedure: CANNULATED HIP PINNING AND HARDWARE REVISION;  Surgeon: Gearlean Alf, MD;   Location: [facility];  Service: Orthopedics;  Laterality: Left;  . I&D EXTREMITY Left 11/16/2014   Procedure:  DEBRIDEMENT OF LEFT FOOT POSSIBLE APPLICATION OF INTEGRIA AND VAC ;  Surgeon: Irene Limbo, MD;  Location: [facility];  Service: Plastics;  Laterality: Left;  . PERIPHERAL VASCULAR CATHETERIZATION N/A 11/15/2014   Procedure: Abdominal Aortogram;  Surgeon: Conrad Timpson, MD;  Location: [facility];  Service: Cardiovascular;  Laterality: N/A;  . PERIPHERAL VASCULAR CATHETERIZATION  11/15/2014   Procedure: Lower Extremity Angiography;  Surgeon: Conrad [place], MD;  Location: [facility];  Service: Cardiovascular;;  . PERIPHERAL VASCULAR CATHETERIZATION Left 11/15/2014   Procedure: Peripheral Vascular Intervention;  Surgeon: Conrad [place], MD;  Location: [facility];  Service: Cardiovascular;  Laterality: Left;  popliteal artery stenting     reports that she has been smoking e-cigarettes and cigarettes. She has a 17.50 pack-year smoking history. She has never used smokeless tobacco. She  reports that she does not drink alcohol or use drugs.  Allergies  Allergen Reactions  . Cortizone-10 [Hydrocortisone] Rash    Per patient, given local injection at knee and developed rash at local site.     Family History  Problem Relation Age of Onset  . Diabetes Mother   . Hypertension Mother   . Vision loss Mother   . Heart disease Mother   . Hypertension Father   . Thyroid disease Neg Hx      Prior to Admission medications   Medication Sig Start Date End Date Taking? Authorizing Provider  albuterol (PROVENTIL HFA;VENTOLIN HFA) 108 (90 Base) MCG/ACT inhaler INHALE 2 PUFFS INTO THE LUNGS EVERY 6 HOURS AS NEEDED FOR WHEEZING OR SHORTNESS OF BREATH Patient taking differently: Inhale 2 puffs into the lungs every 6 (six) hours as needed for wheezing or shortness of breath.  03/08/17  Yes Chesley Mires, MD  ARIPiprazole (ABILIFY) 15 MG tablet Take 15 mg by mouth daily.   Yes [provider]  azithromycin (ZITHROMAX) 250 MG tablet Take 1 tablet (250 mg total) by mouth daily for 2 days. 08/27/17 08/29/17 Yes Purohit, Konrad Dolores, MD  BREO ELLIPTA 100-25 MCG/INH AEPB INHALE 1 PUFF INTO THE LUNGS DAILY Patient taking differently: Inhale 1 puff into the lungs daily.  05/29/17  Yes Chesley Mires, MD  clonazePAM (KLONOPIN) 0.5 MG tablet Take 0.5 mg by mouth 3 (three) times daily as needed for anxiety.   Yes [provider]  diphenoxylate-atropine (LOMOTIL) 2.5-0.025 MG tablet Take 1 tablet by mouth 4 (four) times daily as needed for diarrhea or loose stools. 11/20/16  Yes Campbell Riches, MD  doxepin (SINEQUAN) 10 MG capsule Take 10 mg by mouth at bedtime.  02/13/17  Yes [provider]  elvitegravir-cobicistat-emtricitabine-tenofovir (GENVOYA) 150-150-200-10 MG TABS tablet Take 1 tablet by mouth daily. 05/23/17  Yes Campbell Riches, MD  fenofibrate (TRICOR) 145 MG tablet Take 145 mg by mouth daily.   Yes [provider]  furosemide (LASIX) 80 MG tablet Take 80 mg by mouth daily. 02/10/17  Yes [provider]  HYDROcodone-acetaminophen (NORCO) 10-325 MG tablet Take 1 tablet by mouth every 8 (eight) hours as needed (pain).   Yes [provider]  Insulin Glargine (TOUJEO MAX SOLOSTAR) 300 UNIT/ML SOPN Inject 60 Units into the skin 2 (two) times daily. 05/29/17  Yes Elayne Snare, MD  insulin regular human CONCENTRATED (HUMULIN R U-500 KWIKPEN) 500 UNIT/ML kwikpen INJECT 60 UNITS BEFORE BREAKFAST, 50 UNITS BEFORE LUNCH, AND 35 UNITS BEFORE DINNER. Patient taking differently: Inject 35-60 Units into the skin See admin instructions. INJECT 60 UNITS BEFORE BREAKFAST, 50 UNITS BEFORE LUNCH, AND 35 UNITS BEFORE DINNER. 05/29/17  Yes Elayne Snare, MD  liraglutide (VICTOZA) 18 MG/3ML SOPN Inject 0.3 mLs (1.8 mg total) into the skin daily. 05/29/17  Yes Elayne Snare, MD  MAGNESIUM-OXIDE 400 (241.3 Mg) MG tablet Take 400 mg by mouth daily. 02/15/17  Yes  [provider]  NARCAN 4 MG/0.1ML LIQD nasal spray kit Place 1 spray into the nose as needed. overdose 02/01/17  Yes [provider]  OXYGEN Inhale 3 L into the lungs continuous.   Yes [provider]  pantoprazole (PROTONIX) 40 MG tablet Take 40 mg by mouth daily. 09/02/14  Yes [provider]  predniSONE (DELTASONE) 20 MG tablet Take 2 tablets (40 mg total) by mouth daily for 4 days. 08/27/17 08/31/17 Yes Purohit, Konrad Dolores, MD  rosuvastatin (CRESTOR) 20 MG tablet Take 20 mg by  mouth daily. Reported on 05/02/2015 09/02/14  Yes [provider]  SYMBICORT 80-4.5 MCG/ACT inhaler Inhale 1 puff into the lungs every 12 (twelve) hours as needed (SOB, wheezing).  02/26/17  Yes [provider]  temazepam (RESTORIL) 15 MG capsule Take 30 mg by mouth at bedtime as needed for sleep.  02/13/17  Yes [provider]  tiZANidine (ZANAFLEX) 4 MG tablet Take 4 mg by mouth every 8 (eight) hours.  03/06/16  Yes [provider]  potassium chloride (K-DUR) 10 MEQ tablet Take 1 tablet (10 mEq total) by mouth daily. Patient not taking: Reported on 08/26/2017 02/26/17   Recardo Evangelist, PA-C  potassium chloride SA (K-DUR,KLOR-CON) 20 MEQ tablet Take 1 tablet (20 mEq total) by mouth daily. X 3 days, then check potassium levels if needed to continue Patient not taking: Reported on 08/26/2017 05/25/16   Mendel Corning, MD    Physical Exam: Vitals:   08/29/17 1515 08/29/17 1530 08/29/17 1606 08/29/17 1645  BP: (!) 144/96 (!) 141/96 (!) 121/95 (!) 156/95  Pulse: (!) 103 (!) 103 96 92  Resp: _0 Temp:      TempSrc:      SpO2: 99% 99% 97% 99%    Constitutional: deconditioned and ill looking appearing.  Vitals:   08/29/17 1515 08/29/17 1530 08/29/17 1606 08/29/17 1645  BP: (!) 144/96 (!) 141/96 (!) 121/95 (!) 156/95  Pulse: (!) 103 (!) 103 96 92  Resp: _1 Temp:      TempSrc:      SpO2: 99% 99% 97% 99%   Eyes: PERRL, lids and  conjunctivae normal Head normocephalic, nose and ears with no deformities ENMT: Mucous membranes were dry. Posterior pharynx clear of any exudate or lesions.Normal dentition.  Neck: normal, supple, no masses, no thyromegaly Respiratory: positive breaths sounds on auscultation bilaterally, no wheezing, no crackles. Normal respiratory effort. No accessory muscle use.  Cardiovascular: distant heart sounds, but regular rate and rhythm, no murmurs / rubs / gallops. No extremity edema. 2+ pedal pulses. No carotid bruits.  Abdomen: protuberant with no tenderness, no masses palpated. No hepatosplenomegaly. Bowel sounds positive.  Musculoskeletal: no clubbing / cyanosis. No joint deformity upper and lower extremities. Good ROM, no contractures. Normal muscle tone. Left BKA.  Skin: no rashes, lesions, ulcers. No induration/ mild erythema on the right knee, with no local tenderness or increased local temperature.  Neurologic: CN 2-12 grossly intact. Sensation intact, DTR normal. Strength 5/5 in all 4.     Labs on Admission: I have personally reviewed following labs and imaging studies  CBC: Recent Labs  Lab 08/25/17 2116 08/26/17 1032 08/29/17 1222  WBC 14.2* 13.5* 15.4*  NEUTROABS 11.6* 12.3* 13.4*  HGB 14.2 13.9 16.2*  HCT 44.3 45.4 51.8*  MCV 91.7 91.7 90.2  PLT 250 250 413   Basic Metabolic Panel: Recent Labs  Lab 08/26/17 1032 08/29/17 1222  NA 141 150*  K 4.7 4.7  CL 99 106  CO2 26 22  GLUCOSE 449* 109*  BUN 26* 83*  CREATININE 1.27* 2.04*  CALCIUM 9.6 9.6   GFR: Estimated Creatinine Clearance: 42.2 mL/min (A) (by C-G formula based on SCr of 2.04 mg/dL (H)). Liver Function Tests: Recent Labs  Lab 08/26/17 1032 08/29/17 1222  AST 18 75*  ALT 20 63*  ALKPHOS 60 86  BILITOT 0.7 2.0*  PROT 6.3* 6.6  ALBUMIN 3.2* 3.2*   Recent Labs  Lab 08/26/17 1032  LIPASE 25   No  results for input(s): AMMONIA in the last 168 hours. Coagulation Profile: No results for input(s):  INR, PROTIME in the last 168 hours. Cardiac Enzymes: Recent Labs  Lab 08/29/17 1222  CKTOTAL 788*   BNP (last 3 results) No results for input(s): PROBNP in the last 8760 hours. HbA1C: No results for input(s): HGBA1C in the last 72 hours. CBG: Recent Labs  Lab 08/27/17 0022 08/27/17 0725 08/27/17 1141  GLUCAP 391* 249* 154*   Lipid Profile: No results for input(s): CHOL, HDL, LDLCALC, TRIG, CHOLHDL, LDLDIRECT in the last 72 hours. Thyroid Function Tests: No results for input(s): TSH, T4TOTAL, FREET4, T3FREE, THYROIDAB in the last 72 hours. Anemia Panel: No results for input(s): VITAMINB12, FOLATE, FERRITIN, TIBC, IRON, RETICCTPCT in the last 72 hours. Urine analysis:    Component Value Date/Time   COLORURINE YELLOW 03/02/2017 [place] 03/02/2017 1634   LABSPEC 1.030 03/02/2017 1634   PHURINE 6.0 03/02/2017 1634   GLUCOSEU >=500 (A) 03/02/2017 1634   GLUCOSEU 250 (A) 03/16/2015 1612   HGBUR NEGATIVE 03/02/2017 1634   BILIRUBINUR NEGATIVE 03/02/2017 1634   KETONESUR 20 (A) 03/02/2017 1634   PROTEINUR NEGATIVE 03/02/2017 1634   UROBILINOGEN 0.2 03/16/2015 1612   NITRITE NEGATIVE 03/02/2017 1634   LEUKOCYTESUR NEGATIVE 03/02/2017 1634    Radiological Exams on Admission: Dg Chest 2 View  Result Date: 08/29/2017 CLINICAL DATA:  Syncope.  Shortness of breath. EXAM: CHEST - 2 VIEW COMPARISON:  One-view chest x-ray 08/26/2017 FINDINGS: Heart is enlarged. The there is no edema or effusion. No focal airspace disease is present. Lung volumes are low. Exaggerated thoracic kyphosis is present. Remote right sixth and seventh rib fractures are again noted. IMPRESSION: 1. Stable cardiomegaly without failure. 2. Low lung volumes. Electronically Signed   By: San Morelle M.D.   On: 08/29/2017 12:21   Ct Head Wo Contrast  Result Date: 08/29/2017 CLINICAL DATA:  Syncope and fall 3 days ago.  Initial encounter. EXAM: CT HEAD WITHOUT CONTRAST CT CERVICAL SPINE  WITHOUT CONTRAST TECHNIQUE: Multidetector CT imaging of the head and cervical spine was performed following the standard protocol without intravenous contrast. Multiplanar CT image reconstructions of the cervical spine were also generated. COMPARISON:  None. FINDINGS: CT HEAD FINDINGS Brain: No evidence of acute infarction, hemorrhage, hydrocephalus, extra-axial collection or mass lesion/mass effect. Mild appearing chronic microvascular ischemic change is noted. Vascular: Atherosclerotic vascular disease is seen. Skull: No fracture or focal lesion. Sinuses/Orbits: Negative. Other: None. CT CERVICAL SPINE FINDINGS Alignment: Normal. Skull base and vertebrae: No acute fracture. No primary bone lesion or focal pathologic process. Soft tissues and spinal canal: No prevertebral fluid or swelling. No visible canal hematoma. Disc levels: Mild loss of disc space height and endplate spurring are seen at C5-6. Otherwise negative. Upper chest: Lung apices clear. Other: None. IMPRESSION: No acute abnormality head or cervical spine. Mild chronic microvascular ischemic change. Atherosclerosis. Mild degenerative disc disease C5-6. Electronically Signed   By: Inge Rise M.D.   On: 08/29/2017 12:11   Ct Cervical Spine Wo Contrast  Result Date: 08/29/2017 CLINICAL DATA:  Syncope and fall 3 days ago.  Initial encounter. EXAM: CT HEAD WITHOUT CONTRAST CT CERVICAL SPINE WITHOUT CONTRAST TECHNIQUE: Multidetector CT imaging of the head and cervical spine was performed following the standard protocol without intravenous contrast. Multiplanar CT image reconstructions of the cervical spine were also generated. COMPARISON:  None. FINDINGS: CT HEAD FINDINGS Brain: No evidence of acute infarction, hemorrhage, hydrocephalus, extra-axial collection or mass lesion/mass effect. Mild appearing chronic  microvascular ischemic change is noted. Vascular: Atherosclerotic vascular disease is seen. Skull: No fracture or focal lesion.  Sinuses/Orbits: Negative. Other: None. CT CERVICAL SPINE FINDINGS Alignment: Normal. Skull base and vertebrae: No acute fracture. No primary bone lesion or focal pathologic process. Soft tissues and spinal canal: No prevertebral fluid or swelling. No visible canal hematoma. Disc levels: Mild loss of disc space height and endplate spurring are seen at C5-6. Otherwise negative. Upper chest: Lung apices clear. Other: None. IMPRESSION: No acute abnormality head or cervical spine. Mild chronic microvascular ischemic change. Atherosclerosis. Mild degenerative disc disease C5-6. Electronically Signed   By: Inge Rise M.D.   On: 08/29/2017 12:11   Dg Knee Complete 4 Views Right  Result Date: 08/29/2017 CLINICAL DATA:  Fall.  Knee pain. EXAM: RIGHT KNEE - COMPLETE 4+ VIEW COMPARISON:  None. FINDINGS: No evidence of fracture, dislocation, or joint effusion. No evidence of arthropathy or other focal bone abnormality. Vascular calcification. Soft tissues otherwise unremarkable. IMPRESSION: No fracture, dislocation, or effusion. Electronically Signed   By: Staci Righter M.D.   On: 08/29/2017 12:21   Dg Hip Unilat W Or Wo Pelvis 2-3 Views Right  Result Date: 08/29/2017 CLINICAL DATA:  Fall 3 days ago with right hip pain, initial encounter EXAM: DG HIP (WITH OR WITHOUT PELVIS) 3V RIGHT COMPARISON:  None. FINDINGS: The pelvic ring is intact. Severe degenerative changes of the hip joints are noted bilaterally. Postsurgical changes in the proximal left hip are noted. Osteopenia is noted as well. IMPRESSION: Chronic changes without acute abnormality. Electronically Signed   By: Inez Catalina M.D.   On: 08/29/2017 12:21    EKG: Independently reviewed.  Normal sinus rhythm with left axis deviation.  Assessment/Plan Active Problems:   Syncope  52 year old female who presented after experiencing a syncope episode.  Patient is not able to recall any prodromal symptoms, apparently she spent 24 to 48 hours on the floor.   No apparent seizure activity or head trauma.  Status post left BKA and she was not wearing her prosthesis at the time of the syncope.  On her physical examination blood pressure 121/95, heart rate 96, respiratory rate 18, oxygen saturation 99%.  Dry mucous membranes, her lungs are clear to auscultation, heart S1-S2 present and rhythmic, abdomen protuberant, nontender, no right lower extremity edema, no significant rashes.  Sodium 150, potassium 4.7, chloride 106, bicarb 22, glucose 109, BUN 83, creatinine 2.0, CK 788, troponin 0.08, pH 7.32, PCO2 41.4, PaO2 99.0, bicarbonate 23, oxygen saturation 97%.  White cell count 15.4, hemoglobin 16.2, hematocrit 51.8, platelets 306.  Head and neck CT with no acute changes.  Chest x-ray with cardiomegaly, left rotation, no infiltrates.  Patient will be admitted to the hospital with the working diagnosis of syncope, complicated by dehydration, acute kidney injury, hypernatremia, hyperkalemia and anion gap metabolic acidosis.  1.  Syncope.  Unclear etiology, patient unable to give a detailed history.  Continue telemetry monitoring.  Physical therapy evaluation.  Continue hydration with IV fluids, neurochecks every 4 hours.  Currently not able to do orthostatics due to left BKA.  Note that patient at home is on clonazepam and temazepam. Follow on urine analysis.   2.  Prerenal acute kidney injury on chronic kidney disease stage III, complicated by hypernatremia, hyperkalemia and anion gap metabolic acidosis.  Patient has received IV normal saline in the emergency department, continue hydration with IV half-normal saline at 100 m//hr, will follow up with kidney function and electrolytes, avoid hypotension or nephrotoxic agents.  Old records personally reviewed, baseline creatinine 1.2, calculated GFR 52, cw with CKD stage 3.  Mildly elevated CPK, discontinue rosuvastatin and check CPK in the morning.  3.  COPD.  Chronic and stable, no signs of exacerbation, continue  bronchodilator therapy.  4.  Type 2 diabetes mellitus.  Continue glucose coverage and monitoring with insulin sliding scale, glucose 595, metabolic acidosis likely due to acute kidney injury, no signs of ketosis.  5.  Diastolic heart failure.  No signs of exacerbation, patient clinically hypovolemic, will continue hydration with hypotonic solutions.  6.  HIV.  Continue retroviral agents.  DVT prophylaxis: heparin  Code Status:  full  Family Communication:  No family at the bedside  Disposition Plan:  Pending clinical improvement  Consults called: no  Admission status:  Inpatient     Jennell Janosik Gerome Apley MD [facility] Pager [phone]  If 7PM-7AM, please contact night-coverage [email or web address] Password [facility]  08/29/2017, 5:13 PM

## 2017-08-30 ENCOUNTER — Inpatient Hospital Stay (HOSPITAL_COMMUNITY): Payer: Medicare HMO

## 2017-08-30 ENCOUNTER — Other Ambulatory Visit: Payer: Self-pay

## 2017-08-30 ENCOUNTER — Encounter (HOSPITAL_COMMUNITY): Payer: Self-pay | Admitting: Cardiology

## 2017-08-30 DIAGNOSIS — N183 Chronic kidney disease, stage 3 (moderate): Secondary | ICD-10-CM

## 2017-08-30 DIAGNOSIS — E860 Dehydration: Secondary | ICD-10-CM

## 2017-08-30 DIAGNOSIS — R296 Repeated falls: Secondary | ICD-10-CM

## 2017-08-30 DIAGNOSIS — E118 Type 2 diabetes mellitus with unspecified complications: Secondary | ICD-10-CM

## 2017-08-30 DIAGNOSIS — J440 Chronic obstructive pulmonary disease with acute lower respiratory infection: Secondary | ICD-10-CM

## 2017-08-30 DIAGNOSIS — I10 Essential (primary) hypertension: Secondary | ICD-10-CM

## 2017-08-30 DIAGNOSIS — B20 Human immunodeficiency virus [HIV] disease: Secondary | ICD-10-CM

## 2017-08-30 DIAGNOSIS — R55 Syncope and collapse: Secondary | ICD-10-CM

## 2017-08-30 DIAGNOSIS — E1165 Type 2 diabetes mellitus with hyperglycemia: Secondary | ICD-10-CM

## 2017-08-30 DIAGNOSIS — J209 Acute bronchitis, unspecified: Secondary | ICD-10-CM

## 2017-08-30 LAB — URINALYSIS, ROUTINE W REFLEX MICROSCOPIC
BILIRUBIN URINE: NEGATIVE
Glucose, UA: NEGATIVE mg/dL
Hgb urine dipstick: NEGATIVE
KETONES UR: 5 mg/dL — AB
LEUKOCYTES UA: NEGATIVE
NITRITE: NEGATIVE
PH: 5 (ref 5.0–8.0)
PROTEIN: NEGATIVE mg/dL
Specific Gravity, Urine: 1.019 (ref 1.005–1.030)

## 2017-08-30 LAB — GLUCOSE, CAPILLARY
GLUCOSE-CAPILLARY: 153 mg/dL — AB (ref 70–99)
GLUCOSE-CAPILLARY: 114 mg/dL — AB (ref 70–99)
GLUCOSE-CAPILLARY: 420 mg/dL — AB (ref 70–99)
Glucose-Capillary: 365 mg/dL — ABNORMAL HIGH (ref 70–99)
Glucose-Capillary: 372 mg/dL — ABNORMAL HIGH (ref 70–99)

## 2017-08-30 LAB — BASIC METABOLIC PANEL
ANION GAP: 12 (ref 5–15)
BUN: 66 mg/dL — AB (ref 6–20)
CO2: 24 mmol/L (ref 22–32)
CREATININE: 1.67 mg/dL — AB (ref 0.44–1.00)
Calcium: 8.2 mg/dL — ABNORMAL LOW (ref 8.9–10.3)
Chloride: 100 mmol/L (ref 98–111)
GFR, EST AFRICAN AMERICAN: 39 mL/min — AB (ref >60)
GFR, EST NON AFRICAN AMERICAN: 34 mL/min — AB (ref >60)
GLUCOSE: 156 mg/dL — AB (ref 70–99)
POTASSIUM: 3.3 mmol/L — AB (ref 3.5–5.1)
SODIUM: 136 mmol/L (ref 135–145)

## 2017-08-30 LAB — TROPONIN I
Troponin I: 0.04 ng/mL (ref <0.03)
Troponin I: 0.04 ng/mL (ref <0.03)

## 2017-08-30 LAB — CBC
HCT: 39.2 % (ref 36.0–46.0)
Hemoglobin: 12.6 g/dL (ref 12.0–15.0)
MCH: 28.5 pg (ref 26.0–34.0)
MCHC: 32.1 g/dL (ref 30.0–36.0)
MCV: 88.7 fL (ref 78.0–100.0)
Platelets: 221 10*3/uL (ref 150–400)
RBC: 4.42 MIL/uL (ref 3.87–5.11)
RDW: 18.6 % — AB (ref 11.5–15.5)
WBC: 14.1 10*3/uL — ABNORMAL HIGH (ref 4.0–10.5)

## 2017-08-30 LAB — TSH: TSH: 1.806 u[IU]/mL (ref 0.350–4.500)

## 2017-08-30 LAB — ECHOCARDIOGRAM COMPLETE
Height: 63 in
WEIGHTICAEL: 3763.69 [oz_av]

## 2017-08-30 LAB — CK: Total CK: 235 U/L — ABNORMAL HIGH (ref 38–234)

## 2017-08-30 LAB — GLUCOSE, RANDOM: GLUCOSE: 402 mg/dL — AB (ref 70–99)

## 2017-08-30 MED ORDER — SODIUM CHLORIDE 0.9 % IV SOLN
INTRAVENOUS | Status: DC
  Administered 2017-08-30 – 2017-08-31 (×2): via INTRAVENOUS

## 2017-08-30 MED ORDER — GUAIFENESIN-DM 100-10 MG/5ML PO SYRP
5.0000 mL | ORAL_SOLUTION | ORAL | Status: DC | PRN
  Administered 2017-08-30 – 2017-08-31 (×4): 5 mL via ORAL
  Filled 2017-08-30 (×4): qty 5

## 2017-08-30 MED ORDER — SENNOSIDES-DOCUSATE SODIUM 8.6-50 MG PO TABS
2.0000 | ORAL_TABLET | Freq: Two times a day (BID) | ORAL | Status: DC
  Administered 2017-08-30 – 2017-09-04 (×9): 2 via ORAL
  Filled 2017-08-30 (×11): qty 2

## 2017-08-30 MED ORDER — POLYETHYLENE GLYCOL 3350 17 G PO PACK
17.0000 g | PACK | Freq: Every day | ORAL | Status: DC
  Administered 2017-08-30 – 2017-09-04 (×6): 17 g via ORAL
  Filled 2017-08-30 (×7): qty 1

## 2017-08-30 MED ORDER — POTASSIUM CHLORIDE CRYS ER 20 MEQ PO TBCR
40.0000 meq | EXTENDED_RELEASE_TABLET | Freq: Once | ORAL | Status: AC
  Administered 2017-08-30: 40 meq via ORAL
  Filled 2017-08-30: qty 2

## 2017-08-30 MED ORDER — IPRATROPIUM-ALBUTEROL 0.5-2.5 (3) MG/3ML IN SOLN
3.0000 mL | Freq: Four times a day (QID) | RESPIRATORY_TRACT | Status: DC
  Administered 2017-08-30 – 2017-09-02 (×13): 3 mL via RESPIRATORY_TRACT
  Filled 2017-08-30 (×13): qty 3

## 2017-08-30 MED ORDER — AZITHROMYCIN 500 MG PO TABS
500.0000 mg | ORAL_TABLET | Freq: Every day | ORAL | Status: AC
  Administered 2017-08-30 – 2017-09-01 (×3): 500 mg via ORAL
  Filled 2017-08-30 (×3): qty 1

## 2017-08-30 MED ORDER — METHYLPREDNISOLONE SODIUM SUCC 125 MG IJ SOLR
60.0000 mg | Freq: Two times a day (BID) | INTRAMUSCULAR | Status: DC
  Administered 2017-08-30 (×2): 60 mg via INTRAVENOUS
  Administered 2017-08-31: 09:00:00 via INTRAVENOUS
  Filled 2017-08-30 (×3): qty 2

## 2017-08-30 MED ORDER — MORPHINE SULFATE (PF) 4 MG/ML IV SOLN
4.0000 mg | Freq: Once | INTRAVENOUS | Status: AC
  Administered 2017-08-30: 4 mg via INTRAVENOUS
  Filled 2017-08-30: qty 1

## 2017-08-30 NOTE — Care Management Note (Addendum)
Case Management Note  Patient Details  Name: [name] MRN: [id number] Date of Birth: [birth year]  Subjective/Objective:                    Action/Plan: Patient from home alone on home oxygen  Received following consult :   Patient needs assistance from home health. Patient is ADAMANT she does not want to discuss any placement options, and does not want these brought up during the conversation. Daughter-in-law also states she is a good person to speak to about options, and would like to be included in the conversation   Will await PT recommendations. Also will need MD orders for home health. Will continue to follow. Expected Discharge Date:                  Expected Discharge Plan:     In-House Referral:  Clinical Social Work  Discharge planning Services  CM Consult  Post Acute Care Choice:  Durable Medical Equipment, Home Health Choice offered to:     DME Arranged:    DME Agency:     HH Arranged:    [place] Agency:     Status of Service:  In process, will continue to follow  If discussed at Long Length of Stay Meetings, dates discussed:    Additional Comments:  Marilu Favre, RN 08/30/2017, 9:42 AM

## 2017-08-30 NOTE — Progress Notes (Signed)
  Echocardiogram 2D Echocardiogram has been performed.  [name] 08/30/2017, 1:25 PM

## 2017-08-30 NOTE — Progress Notes (Signed)
PROGRESS NOTE    [name]  ERX:[id number] DOB: 1965/10/15 DOA: 08/29/2017 PCP: [name], PA-C    Brief Narrative:  [name] is a 52 y.o. female with medical history significant of COPD, type 2 diabetes mellitus, HIV, diastolic heart failure, dyslipidemia and chronic pain syndrome.  Patient was discharged from the hospital August 27 after being treated for COPD exacerbation.  Apparently she lives at home by herself.  She reported experiencing a syncope episode in her bedroom, unclear about prodromal symptoms, but apparently after recovering her consciousness she was not able to stand.  She had a left BKA and uses a prosthesis.  At the time of her fall she was not using the prosthesis.  She spent about 24 to 48 hours on the floor, until she was found by her daughter-in-law who brought her to the hospital.    Assessment & Plan:   Active Problems:   Human immunodeficiency virus (HIV) disease ([place])   Diabetes mellitus type 2 with complications, uncontrolled ([place])   Mood disorder ([place])   Essential hypertension   COPD with acute bronchitis ([place])   Tobacco abuse   Repeated falls   Diabetic neuropathy ([place])   Obesity hypoventilation syndrome ([place])   Hyperlipidemia   CKD (chronic kidney disease), stage III ([name])   Syncope   Syncope: Differential include fall from deconditioning from BKA. vs vaso vagal vs arrhythmias vs dehydration.  CT head and cervical spine without any acute findings.  Orthostatic vital signs couldn't calculated due to her BKA.  EKG yesterday shows atrial fibrillation with rate in 90/min, ? Paroxysmal afib. Repeat ekg ordered today.  Suspect probably from dehydration as she was found to be in AKI and hypernatremic.  Serial troponin's. Monitor overnight on telemetry.  Echocardiogram pending. Get TSH.    Mild COPD exacerbation:  She is restarted on IV solumedrol and duo nebs every 6 hours and oral azithromycin for 3 more days. She continues to  cough and has audible wheezing.    Mild rhabdomyolysis Possibly from dehydration. Improving with IV fluids.    Hypernatremia: resolved with hydration.   AKI on stage 3 CKD:  Possibly pre renal from dehydration.  Improving with IV fluids.  No signs of infection found.    Hypokalemia Replaced.    Elevated liver function tests Possibly from mild rhabdomyolysis.    Leukocytosis from steroid use.    Chronic diastolic heart failure Appears to be compensated.    Diabetes mellitus;  CBG (last 3)  Recent Labs    08/29/17 1815 08/29/17 2126 08/30/17 0828  GLUCAP 120* 126* 153*   Resume SSI    Hiv:  Resume home dose of gENVOYA.       DVT prophylaxis: heparin sq Code Status: FULL CODE.  Family Communication:none at bedside.  Disposition Plan: pending clinical improvement.   Consultants:   None.    Procedures: echocardiogram ordered   Antimicrobials: was on azithromycin at home, which will be continued for 3 more days.    Subjective: Reports not feeling good. Does not know if she passed out or slipped on her left prosthesis.  Currently reports some sob with wheezing, no chest pain .   Objective: Vitals:   08/29/17 2153 08/29/17 2313 08/30/17 0300 08/30/17 0745  BP:  134/87 (!) 144/79 106/67  Pulse:  92 87 80  Resp:  14 (!) 21 16  Temp:  97.7 F (36.5 C) 98.5 F (36.9 C) 98.4 F (36.9 C)  TempSrc:  Oral Oral Oral  SpO2:  99% 100% 96% 96%  Weight:      Height:        Intake/Output Summary (Last 24 hours) at 08/30/2017 0920 Last data filed at 08/30/2017 0301 Gross per 24 hour  Intake 1315.56 ml  Output 1550 ml  Net -234.44 ml   Filed Weights   08/29/17 1956  Weight: 106.7 kg    Examination:  General exam: Appears calm and comfortable  Respiratory system: bilateral exp wheezing, diminished at bases.  Cardiovascular system: S1 & S2 heard, RRR. No JVD, murmurs Gastrointestinal system: Abdomen is nondistended, soft and nontender.Normal  bowel sounds heard. Central nervous system: Alert and oriented. Grossly non focal.  Extremities: left BKA, right lower extremity shin redness, no pedal edema.  Skin: No rashes, lesions or ulcers Psychiatry:  Mood & affect appropriate.     Data Reviewed: I have personally reviewed following labs and imaging studies  CBC: Recent Labs  Lab 08/25/17 2116 08/26/17 1032 08/29/17 1222 08/30/17 0558  WBC 14.2* 13.5* 15.4* 14.1*  NEUTROABS 11.6* 12.3* 13.4*  --   HGB 14.2 13.9 16.2* 12.6  HCT 44.3 45.4 51.8* 39.2  MCV 91.7 91.7 90.2 88.7  PLT 250 250 306 073   Basic Metabolic Panel: Recent Labs  Lab 08/26/17 1032 08/29/17 1222 08/30/17 0558  NA 141 150* 136  K 4.7 4.7 3.3*  CL 99 106 100  CO2 26 22 24   GLUCOSE 449* 109* 156*  BUN 26* 83* 66*  CREATININE 1.27* 2.04* 1.67*  CALCIUM 9.6 9.6 8.2*   GFR: Estimated Creatinine Clearance: 45.6 mL/min (A) (by C-G formula based on SCr of 1.67 mg/dL (H)). Liver Function Tests: Recent Labs  Lab 08/26/17 1032 08/29/17 1222  AST 18 75*  ALT 20 63*  ALKPHOS 60 86  BILITOT 0.7 2.0*  PROT 6.3* 6.6  ALBUMIN 3.2* 3.2*   Recent Labs  Lab 08/26/17 1032  LIPASE 25   No results for input(s): AMMONIA in the last 168 hours. Coagulation Profile: No results for input(s): INR, PROTIME in the last 168 hours. Cardiac Enzymes: Recent Labs  Lab 08/29/17 1222 08/30/17 0558  CKTOTAL 788* 235*   BNP (last 3 results) No results for input(s): PROBNP in the last 8760 hours. HbA1C: No results for input(s): HGBA1C in the last 72 hours. CBG: Recent Labs  Lab 08/27/17 0725 08/27/17 1141 08/29/17 1815 08/29/17 2126 08/30/17 0828  GLUCAP 249* 154* 120* 126* 153*   Lipid Profile: No results for input(s): CHOL, HDL, LDLCALC, TRIG, CHOLHDL, LDLDIRECT in the last 72 hours. Thyroid Function Tests: No results for input(s): TSH, T4TOTAL, FREET4, T3FREE, THYROIDAB in the last 72 hours. Anemia Panel: No results for input(s): VITAMINB12,  FOLATE, FERRITIN, TIBC, IRON, RETICCTPCT in the last 72 hours. Sepsis Labs: Recent Labs  Lab 08/26/17 1024 08/26/17 1252 08/29/17 1233  LATICACIDVEN 1.98* 1.94* 1.60    Recent Results (from the past 240 hour(s))  MRSA PCR Screening     Status: None   Collection Time: 08/29/17  6:24 PM  Result Value Ref Range Status   MRSA by PCR NEGATIVE NEGATIVE Final    Comment:        The GeneXpert MRSA Assay (FDA approved for NASAL specimens only), is one component of a comprehensive MRSA colonization surveillance program. It is not intended to diagnose MRSA infection nor to guide or monitor treatment for MRSA infections. Performed at [facility], [place] [address], [place], [place] [address]          Radiology Studies: Dg Chest  2 View  Result Date: 08/29/2017 CLINICAL DATA:  Syncope.  Shortness of breath. EXAM: CHEST - 2 VIEW COMPARISON:  One-view chest x-ray 08/26/2017 FINDINGS: Heart is enlarged. The there is no edema or effusion. No focal airspace disease is present. Lung volumes are low. Exaggerated thoracic kyphosis is present. Remote right sixth and seventh rib fractures are again noted. IMPRESSION: 1. Stable cardiomegaly without failure. 2. Low lung volumes. Electronically Signed   By: San Morelle M.D.   On: 08/29/2017 12:21   Ct Head Wo Contrast  Result Date: 08/29/2017 CLINICAL DATA:  Syncope and fall 3 days ago.  Initial encounter. EXAM: CT HEAD WITHOUT CONTRAST CT CERVICAL SPINE WITHOUT CONTRAST TECHNIQUE: Multidetector CT imaging of the head and cervical spine was performed following the standard protocol without intravenous contrast. Multiplanar CT image reconstructions of the cervical spine were also generated. COMPARISON:  None. FINDINGS: CT HEAD FINDINGS Brain: No evidence of acute infarction, hemorrhage, hydrocephalus, extra-axial collection or mass lesion/mass effect. Mild appearing chronic microvascular ischemic change is noted. Vascular: Atherosclerotic  vascular disease is seen. Skull: No fracture or focal lesion. Sinuses/Orbits: Negative. Other: None. CT CERVICAL SPINE FINDINGS Alignment: Normal. Skull base and vertebrae: No acute fracture. No primary bone lesion or focal pathologic process. Soft tissues and spinal canal: No prevertebral fluid or swelling. No visible canal hematoma. Disc levels: Mild loss of disc space height and endplate spurring are seen at C5-6. Otherwise negative. Upper chest: Lung apices clear. Other: None. IMPRESSION: No acute abnormality head or cervical spine. Mild chronic microvascular ischemic change. Atherosclerosis. Mild degenerative disc disease C5-6. Electronically Signed   By: Inge Rise M.D.   On: 08/29/2017 12:11   Ct Cervical Spine Wo Contrast  Result Date: 08/29/2017 CLINICAL DATA:  Syncope and fall 3 days ago.  Initial encounter. EXAM: CT HEAD WITHOUT CONTRAST CT CERVICAL SPINE WITHOUT CONTRAST TECHNIQUE: Multidetector CT imaging of the head and cervical spine was performed following the standard protocol without intravenous contrast. Multiplanar CT image reconstructions of the cervical spine were also generated. COMPARISON:  None. FINDINGS: CT HEAD FINDINGS Brain: No evidence of acute infarction, hemorrhage, hydrocephalus, extra-axial collection or mass lesion/mass effect. Mild appearing chronic microvascular ischemic change is noted. Vascular: Atherosclerotic vascular disease is seen. Skull: No fracture or focal lesion. Sinuses/Orbits: Negative. Other: None. CT CERVICAL SPINE FINDINGS Alignment: Normal. Skull base and vertebrae: No acute fracture. No primary bone lesion or focal pathologic process. Soft tissues and spinal canal: No prevertebral fluid or swelling. No visible canal hematoma. Disc levels: Mild loss of disc space height and endplate spurring are seen at C5-6. Otherwise negative. Upper chest: Lung apices clear. Other: None. IMPRESSION: No acute abnormality head or cervical spine. Mild chronic  microvascular ischemic change. Atherosclerosis. Mild degenerative disc disease C5-6. Electronically Signed   By: Inge Rise M.D.   On: 08/29/2017 12:11   Dg Knee Complete 4 Views Right  Result Date: 08/29/2017 CLINICAL DATA:  Fall.  Knee pain. EXAM: RIGHT KNEE - COMPLETE 4+ VIEW COMPARISON:  None. FINDINGS: No evidence of fracture, dislocation, or joint effusion. No evidence of arthropathy or other focal bone abnormality. Vascular calcification. Soft tissues otherwise unremarkable. IMPRESSION: No fracture, dislocation, or effusion. Electronically Signed   By: Staci Righter M.D.   On: 08/29/2017 12:21   Dg Hip Unilat W Or Wo Pelvis 2-3 Views Right  Result Date: 08/29/2017 CLINICAL DATA:  Fall 3 days ago with right hip pain, initial encounter EXAM: DG HIP (WITH OR WITHOUT PELVIS) 3V RIGHT COMPARISON:  None.  FINDINGS: The pelvic ring is intact. Severe degenerative changes of the hip joints are noted bilaterally. Postsurgical changes in the proximal left hip are noted. Osteopenia is noted as well. IMPRESSION: Chronic changes without acute abnormality. Electronically Signed   By: Inez Catalina M.D.   On: 08/29/2017 12:21        Scheduled Meds: . ARIPiprazole  15 mg Oral Daily  . doxepin  10 mg Oral QHS  . elvitegravir-cobicistat-emtricitabine-tenofovir  1 tablet Oral Q breakfast  . fenofibrate  160 mg Oral Daily  . heparin  5,000 Units Subcutaneous Q8H  . insulin aspart  0-9 Units Subcutaneous TID WC  . ipratropium-albuterol  3 mL Nebulization Q6H  . magnesium oxide  400 mg Oral Daily  . methylPREDNISolone (SOLU-MEDROL) injection  60 mg Intravenous Q12H  . multivitamin with minerals  1 tablet Oral Daily  . pantoprazole  40 mg Oral Daily  . sodium chloride flush  3 mL Intravenous Q12H   Continuous Infusions: . sodium chloride    . sodium chloride 50 mL/hr at 08/30/17 0845     LOS: 1 day    Time spent: 35 minutes.     Hosie Poisson, MD [facility] Pager [phone]    If 7PM-7AM, please contact night-coverage [email or web address] Password [email or web address] 08/30/2017, 9:20 AM

## 2017-08-30 NOTE — Progress Notes (Signed)
Blood pressure was reporting as low but changed blood pressure to larger size and reported a higher blood pressure. Will continue to monitor.

## 2017-08-30 NOTE — Progress Notes (Signed)
Patient has been resting at intervals all day. Pain is noted and documented with interventions that have been effective. Treatment team made rounds and placed orders. Notified for critical lab work. Informed treatment team of coughing and lack of urine this shift. Preformed bed side bladder scanner and results were 774ml. Preformed in and out cath with 1028ml results. Patient states she feels better. Informed Cardiac team of EKG results and they stated patient has Tremors that can alter EKG rhythm strips. Patient underwent ECHO with no distress noted. Also called all numbers listed in the chart for family to complete admission assessment and ask if family had the patient's prosthetic leg. Left message with no return call currently. Turned patient every two hours. Patient currently resting, call bell within reach, side rails up X2, bed in the lowest position, will continue to monitor.

## 2017-08-30 NOTE — Progress Notes (Signed)
PT Cancellation Note  Patient Details Name: [name] MRN: [id number] DOB: 03-20-1965   Cancelled Treatment:    Reason Eval/Treat Not Completed: Patient declined, no reason specified Pt adamantly denying therapy today, citing she does not have her prosthetic leg and is experiencing high levels chest pain despite morphine. Tearful and anxious, unable to encourage for even bed mobility. Pt agreeable for therapy to check back tomorrow. RN aware of chest pain.   Reinaldo Berber, PT, DPT Acute Rehab Services Pager: [phone]     Reinaldo Berber 08/30/2017, 10:37 AM

## 2017-08-30 NOTE — Progress Notes (Signed)
  Dr. Harrington Challenger reviewed EKGs and rhythm strips and diagnosed artifact on monitors not a flutter.  Pt does have body tremors.  Her echo was normal as well.  If you require a formal consult please call back.  Thank you.

## 2017-08-30 NOTE — Plan of Care (Signed)

## 2017-08-30 NOTE — Progress Notes (Signed)
CRITICAL VALUE ALERT  Critical Value:  Glucose 420  Date & Time Notied:  08/30/17 @ 6815   Provider Notified: Karleen Hampshire  Orders Received/Actions taken: Given 9 units of insulin and order a stat Glucose verification per protocol.

## 2017-08-30 NOTE — Progress Notes (Signed)
CRITICAL VALUE ALERT  Critical Value: 0.04 Troponin  Date & Time Notied:  08/30/17 1053  Provider Notified: Karleen Hampshire  Orders Received/Actions taken: no new orders recieved

## 2017-08-31 DIAGNOSIS — E1142 Type 2 diabetes mellitus with diabetic polyneuropathy: Secondary | ICD-10-CM

## 2017-08-31 DIAGNOSIS — E785 Hyperlipidemia, unspecified: Secondary | ICD-10-CM

## 2017-08-31 LAB — GLUCOSE, CAPILLARY
GLUCOSE-CAPILLARY: 370 mg/dL — AB (ref 70–99)
GLUCOSE-CAPILLARY: 331 mg/dL — AB (ref 70–99)
Glucose-Capillary: 358 mg/dL — ABNORMAL HIGH (ref 70–99)
Glucose-Capillary: 362 mg/dL — ABNORMAL HIGH (ref 70–99)
Glucose-Capillary: 456 mg/dL — ABNORMAL HIGH (ref 70–99)

## 2017-08-31 LAB — BASIC METABOLIC PANEL
ANION GAP: 14 (ref 5–15)
BUN: 42 mg/dL — ABNORMAL HIGH (ref 6–20)
CALCIUM: 8.6 mg/dL — AB (ref 8.9–10.3)
CO2: 21 mmol/L — ABNORMAL LOW (ref 22–32)
CREATININE: 1.45 mg/dL — AB (ref 0.44–1.00)
Chloride: 101 mmol/L (ref 98–111)
GFR, EST AFRICAN AMERICAN: 47 mL/min — AB (ref >60)
GFR, EST NON AFRICAN AMERICAN: 40 mL/min — AB (ref >60)
Glucose, Bld: 430 mg/dL — ABNORMAL HIGH (ref 70–99)
Potassium: 4.9 mmol/L (ref 3.5–5.1)
SODIUM: 136 mmol/L (ref 135–145)

## 2017-08-31 MED ORDER — INSULIN ASPART 100 UNIT/ML ~~LOC~~ SOLN
10.0000 [IU] | Freq: Once | SUBCUTANEOUS | Status: AC
  Administered 2017-08-31: 10 [IU] via SUBCUTANEOUS

## 2017-08-31 MED ORDER — INSULIN ASPART 100 UNIT/ML ~~LOC~~ SOLN
0.0000 [IU] | Freq: Three times a day (TID) | SUBCUTANEOUS | Status: DC
  Administered 2017-08-31: 15 [IU] via SUBCUTANEOUS
  Administered 2017-08-31 – 2017-09-01 (×2): 20 [IU] via SUBCUTANEOUS
  Administered 2017-09-01: 15 [IU] via SUBCUTANEOUS
  Administered 2017-09-01: 20 [IU] via SUBCUTANEOUS
  Administered 2017-09-02: 4 [IU] via SUBCUTANEOUS
  Administered 2017-09-02: 15 [IU] via SUBCUTANEOUS
  Administered 2017-09-02: 20 [IU] via SUBCUTANEOUS
  Administered 2017-09-03 (×2): 7 [IU] via SUBCUTANEOUS
  Administered 2017-09-03: 4 [IU] via SUBCUTANEOUS

## 2017-08-31 MED ORDER — INSULIN ASPART 100 UNIT/ML ~~LOC~~ SOLN
0.0000 [IU] | Freq: Every day | SUBCUTANEOUS | Status: DC
  Administered 2017-08-31: 4 [IU] via SUBCUTANEOUS
  Administered 2017-09-01: 5 [IU] via SUBCUTANEOUS

## 2017-08-31 MED ORDER — INSULIN GLARGINE 100 UNIT/ML ~~LOC~~ SOLN
10.0000 [IU] | Freq: Every day | SUBCUTANEOUS | Status: DC
  Administered 2017-08-31 – 2017-09-01 (×2): 10 [IU] via SUBCUTANEOUS
  Filled 2017-08-31 (×2): qty 0.1

## 2017-08-31 MED ORDER — METHYLPREDNISOLONE SODIUM SUCC 125 MG IJ SOLR
60.0000 mg | INTRAMUSCULAR | Status: DC
  Administered 2017-09-01: 60 mg via INTRAVENOUS
  Filled 2017-08-31: qty 2

## 2017-08-31 NOTE — Evaluation (Signed)
Physical Therapy Evaluation Patient Details Name: [name] MRN: [id number] DOB: January 12, 1965 Today's Date: 08/31/2017   History of Present Illness  Pt is a 52 y.o. female admitted 08/29/17 after reportedly having syncopal episode with fall at home and being found down 24-48 hours later. Suspect syncopal episode due to hydration; pt found to be in AKI and hypernatremic. Mild rhabdomyolysis. CT head and cervical spine clear. PMH includes COPD, DMII, HIV, HF, chronic pain, L BKA.     Clinical Impression  Pt presents with an overall decrease in functional mobility secondary to above. PTA, pt lives alone without consistent support; ambulatory household distances with L BKA and RW, wheelchair for longer distances. MaxA for bed mobility; pt only agreeable for OOB mobility when family brings prosthetic (although pt has not been able to get in contact with family). Demonstrates intermittent confusion, poor insight into deficits, and decreased problem solving. Discussed recommendation for SNF-level therapies; pt agreeable to this as of now. Will follow acutely to address established goals.     Follow Up Recommendations SNF;Supervision for mobility/OOB    Equipment Recommendations  None recommended by PT    Recommendations for Other Services       Precautions / Restrictions Precautions Precautions: Fall Precaution Comments: L BKA (prosthetic at home) Restrictions Weight Bearing Restrictions: No      Mobility  Bed Mobility Overal bed mobility: Needs Assistance Bed Mobility: Supine to Sit;Sit to Supine     Supine to sit: Max assist;HOB elevated Sit to supine: Max assist   General bed mobility comments: Max cues and encouragement to participate for sitting EOB; maxA to assist trunk elevation and scoot hips to EOB  Transfers                 General transfer comment: Declining transfer to recliner without prosthetic  Ambulation/Gait                Stairs            Wheelchair Mobility    Modified Rankin (Stroke Patients Only)       Balance Overall balance assessment: Needs assistance   Sitting balance-Leahy Scale: Fair                                       Pertinent Vitals/Pain Pain Assessment: Faces Faces Pain Scale: Hurts little more Pain Location: Chest with coughing Pain Descriptors / Indicators: Sore;Grimacing Pain Intervention(s): Monitored during session    Home Living Family/patient expects to be discharged to:: Private residence Living Arrangements: Alone Available Help at Discharge: Family;Available PRN/intermittently Type of Home: House Home Access: Ramped entrance     Home Layout: One level Home Equipment: Walker - 2 wheels;Cane - single point;Wheelchair - [employer];Shower seat      Prior Function Level of Independence: Independent with assistive device(s)         Comments: Indep to don L BKA. Amb household distances with RW; w/c for longer distances. Does not drive. Reports unreliable support from family. Endorses many many falls at home     Hand Dominance        Extremity/Trunk Assessment   Upper Extremity Assessment Upper Extremity Assessment: Generalized weakness    Lower Extremity Assessment Lower Extremity Assessment: LLE deficits/detail;Generalized weakness LLE Deficits / Details: s/p L BKA       Communication   Communication: No difficulties  Cognition Arousal/Alertness: Awake/alert Behavior During Therapy: Flat affect  Overall Cognitive Status: No family/caregiver present to determine baseline cognitive functioning Area of Impairment: Orientation;Attention;Memory;Following commands;Safety/judgement;Awareness;Problem solving                 Orientation Level: Disoriented to;Time Current Attention Level: Selective Memory: Decreased short-term memory Following Commands: Follows one step commands with increased time Safety/Judgement: Decreased awareness  of safety;Decreased awareness of deficits Awareness: Intellectual Problem Solving: Slow processing;Requires verbal cues General Comments: Pt stating she needed to get to bathroom despite education about foley catheter; also stating she can walking into bathroom although she had just said she cannot walk without RW. Poor insight into deficits      General Comments      Exercises     Assessment/Plan    PT Assessment Patient needs continued PT services  PT Problem List Decreased strength;Decreased activity tolerance;Decreased balance;Decreased mobility;Decreased knowledge of use of DME;Decreased safety awareness       PT Treatment Interventions DME instruction;Gait training;Functional mobility training;Therapeutic activities;Therapeutic exercise;Balance training;Patient/family education;Wheelchair mobility training;Cognitive remediation    PT Goals (Current goals can be found in the Care Plan section)  Acute Rehab PT Goals Patient Stated Goal: Reports agreeable to SNF PT Goal Formulation: With patient Time For Goal Achievement: 09/14/17 Potential to Achieve Goals: Fair    Frequency Min 2X/week   Barriers to discharge Decreased caregiver support      Co-evaluation               AM-PAC PT "6 Clicks" Daily Activity  Outcome Measure Difficulty turning over in bed (including adjusting bedclothes, sheets and blankets)?: Unable Difficulty moving from lying on back to sitting on the side of the bed? : Unable Difficulty sitting down on and standing up from a chair with arms (e.g., wheelchair, bedside commode, etc,.)?: Unable Help needed moving to and from a bed to chair (including a wheelchair)?: A Lot Help needed walking in hospital room?: A Lot Help needed climbing 3-5 steps with a railing? : Total 6 Click Score: 8    End of Session   Activity Tolerance: Patient limited by fatigue;Patient limited by pain Patient left: in bed;with call bell/phone within reach;with bed  alarm set Nurse Communication: Mobility status PT Visit Diagnosis: Other abnormalities of gait and mobility (R26.89);Repeated falls (R29.6)    Time: 6811-5726 PT Time Calculation (min) (ACUTE ONLY): 21 min   Charges:   PT Evaluation $PT Eval Moderate Complexity: 1 Mod         Mabeline Caras, PT, DPT Acute Rehabilitation Services  Pager [phone] Office [phone]  Derry Lory 08/31/2017, 5:21 PM

## 2017-08-31 NOTE — Progress Notes (Signed)
PROGRESS NOTE    [name]  SEG:[id number] DOB: 21-May-1965 DOA: 08/29/2017 PCP: [name], PA-C    Brief Narrative:  [name] is a 52 y.o. female with medical history significant of COPD, type 2 diabetes mellitus, HIV, diastolic heart failure, dyslipidemia and chronic pain syndrome.  Patient was discharged from the hospital August 27 after being treated for COPD exacerbation.  Apparently she lives at home by herself.  She reported experiencing a syncope episode in her bedroom, unclear about prodromal symptoms, but apparently after recovering her consciousness she was not able to stand.  She had a left BKA and uses a prosthesis.  At the time of her fall she was not using the prosthesis.  She spent about 24 to 48 hours on the floor, until she was found by her daughter-in-law who brought her to the hospital.    Assessment & Plan:   Active Problems:   Human immunodeficiency virus (HIV) disease ([place])   Diabetes mellitus type 2 with complications, uncontrolled ([place])   Mood disorder ([place])   Essential hypertension   COPD with acute bronchitis ([place])   Tobacco abuse   Repeated falls   Diabetic neuropathy ([place])   Obesity hypoventilation syndrome ([place])   Hyperlipidemia   CKD (chronic kidney disease), stage III ([name])   Syncope   Syncope: Differential include fall from deconditioning from BKA. vs vaso vagal vs arrhythmias vs dehydration. Suspect probably from dehydration as she was found to be in AKI and hypernatremic.  CT head and cervical spine without any acute findings.  Orthostatic vital signs couldn't calculated due to her BKA.  EKG yesterday shows atrial fibrillation with rate in 90/min, ? Paroxysmal afib. Repeat ekg ordered showed atrial flutter, cardiology consulted, suggested it was artifact.  TSH WNL.  Echocardiogram reviewed.     Mild COPD exacerbation:  She is restarted on IV solumedrol and duo nebs every 6 hours and oral azithromycin for 3 more days.her  coughing and wheezing has improved.  Started tapering steroids.    Mild rhabdomyolysis Possibly from dehydration. Improving with IV fluids.    Hypernatremia: resolved with hydration.   AKI on stage 3 CKD:  Possibly pre renal from dehydration.  Improving with IV fluids.  No signs of infection found.    Hypokalemia Replaced.    Elevated liver function tests Possibly from mild rhabdomyolysis. She denies any nausea or vomiting.    Leukocytosis from steroid use.    Chronic diastolic heart failure Appears to be compensated.    Uncontrolled Diabetes mellitus; with hyperglycemia CBG (last 3)  Recent Labs    08/31/17 0250 08/31/17 0815 08/31/17 1214  GLUCAP 370* 358* 362*   not well controlled. Unclear if she is complaint to her diabetic meds at home.  Elevated cbg's probably from steroids.  Changed SSI to resistant scale and added lantus 10 units daily.    Hiv:  Resume home dose of gENVOYA.       DVT prophylaxis: heparin sq Code Status: FULL CODE.  Family Communication:none at bedside.  Disposition Plan: pending clinical improvement.   Consultants:   Cardiology.    Procedures: echocardiogram ordered   Antimicrobials: was on azithromycin at home, which will be continued for 3 more days.    Subjective: Sleeping comfortably.  Reports she is breathing better.    Objective: Vitals:   08/31/17 0807 08/31/17 1125 08/31/17 1241 08/31/17 1546  BP: 101/75 (!) 162/76  (!) 116/95  Pulse: 86     Resp: 13 20  18  Temp: 98.6 F (37 C) 98.4 F (36.9 C)    TempSrc: Oral Oral    SpO2: 95%  98%   Weight:      Height:        Intake/Output Summary (Last 24 hours) at 08/31/2017 1551 Last data filed at 08/31/2017 1400 Gross per 24 hour  Intake 1987.25 ml  Output 2250 ml  Net -262.75 ml   Filed Weights   08/29/17 1956  Weight: 106.7 kg    Examination:  General exam: Appears calm and comfortable on 2lit of [place] oxygen.  Respiratory system: air entry  fair, wheezing has improved.  Cardiovascular system: S1 & S2 heard, RRR. No JVD, murmurs Gastrointestinal system: Abdomen is soft NT ND BS+ Central nervous system: Alert and oriented. Grossly non focal.  Extremities: left BKA, right lower extremity shin redness, no pedal edema.  Skin: No rashes, lesions or ulcers Psychiatry:  Mood & affect appropriate.     Data Reviewed: I have personally reviewed following labs and imaging studies  CBC: Recent Labs  Lab 08/25/17 2116 08/26/17 1032 08/29/17 1222 08/30/17 0558  WBC 14.2* 13.5* 15.4* 14.1*  NEUTROABS 11.6* 12.3* 13.4*  --   HGB 14.2 13.9 16.2* 12.6  HCT 44.3 45.4 51.8* 39.2  MCV 91.7 91.7 90.2 88.7  PLT 250 250 306 387   Basic Metabolic Panel: Recent Labs  Lab 08/26/17 1032 08/29/17 1222 08/30/17 0558 08/30/17 1745 08/31/17 0237  NA 141 150* 136  --  136  K 4.7 4.7 3.3*  --  4.9  CL 99 106 100  --  101  CO2 26 22 24   --  21*  GLUCOSE 449* 109* 156* 402* 430*  BUN 26* 83* 66*  --  42*  CREATININE 1.27* 2.04* 1.67*  --  1.45*  CALCIUM 9.6 9.6 8.2*  --  8.6*   GFR: Estimated Creatinine Clearance: 52.5 mL/min (A) (by C-G formula based on SCr of 1.45 mg/dL (H)). Liver Function Tests: Recent Labs  Lab 08/26/17 1032 08/29/17 1222  AST 18 75*  ALT 20 63*  ALKPHOS 60 86  BILITOT 0.7 2.0*  PROT 6.3* 6.6  ALBUMIN 3.2* 3.2*   Recent Labs  Lab 08/26/17 1032  LIPASE 25   No results for input(s): AMMONIA in the last 168 hours. Coagulation Profile: No results for input(s): INR, PROTIME in the last 168 hours. Cardiac Enzymes: Recent Labs  Lab 08/29/17 1222 08/30/17 0558 08/30/17 0958 08/30/17 1430 08/30/17 2119  CKTOTAL 788* 235*  --   --   --   TROPONINI  --   --  0.04* 0.04* <0.03   BNP (last 3 results) No results for input(s): PROBNP in the last 8760 hours. HbA1C: No results for input(s): HGBA1C in the last 72 hours. CBG: Recent Labs  Lab 08/30/17 1830 08/30/17 2138 08/31/17 0250 08/31/17 0815  08/31/17 1214  GLUCAP 365* 372* 370* 358* 362*   Lipid Profile: No results for input(s): CHOL, HDL, LDLCALC, TRIG, CHOLHDL, LDLDIRECT in the last 72 hours. Thyroid Function Tests: Recent Labs    08/30/17 0958  TSH 1.806   Anemia Panel: No results for input(s): VITAMINB12, FOLATE, FERRITIN, TIBC, IRON, RETICCTPCT in the last 72 hours. Sepsis Labs: Recent Labs  Lab 08/26/17 1024 08/26/17 1252 08/29/17 1233  LATICACIDVEN 1.98* 1.94* 1.60    Recent Results (from the past 240 hour(s))  MRSA PCR Screening     Status: None   Collection Time: 08/29/17  6:24 PM  Result Value Ref Range Status   MRSA  by PCR NEGATIVE NEGATIVE Final    Comment:        The GeneXpert MRSA Assay (FDA approved for NASAL specimens only), is one component of a comprehensive MRSA colonization surveillance program. It is not intended to diagnose MRSA infection nor to guide or monitor treatment for MRSA infections. Performed at [facility], [place] [address] [place], [place] [address]          Radiology Studies: No results found.      Scheduled Meds: . ARIPiprazole  15 mg Oral Daily  . azithromycin  500 mg Oral Daily  . doxepin  10 mg Oral QHS  . elvitegravir-cobicistat-emtricitabine-tenofovir  1 tablet Oral Q breakfast  . fenofibrate  160 mg Oral Daily  . heparin  5,000 Units Subcutaneous Q8H  . insulin aspart  0-20 Units Subcutaneous TID WC  . insulin aspart  0-5 Units Subcutaneous QHS  . insulin glargine  10 Units Subcutaneous Daily  . ipratropium-albuterol  3 mL Nebulization Q6H  . magnesium oxide  400 mg Oral Daily  . [START ON 09/01/2017] methylPREDNISolone (SOLU-MEDROL) injection  60 mg Intravenous Q24H  . multivitamin with minerals  1 tablet Oral Daily  . pantoprazole  40 mg Oral Daily  . polyethylene glycol  17 g Oral Daily  . senna-docusate  2 tablet Oral BID  . sodium chloride flush  3 mL Intravenous Q12H   Continuous Infusions: . sodium chloride    . sodium chloride  50 mL/hr at 08/31/17 1200     LOS: 2 days    Time spent: 35 minutes.     Hosie Poisson, MD [facility] Pager [phone]   If 7PM-7AM, please contact night-coverage [email or web address] Password [email or web address] 08/31/2017, 3:51 PM

## 2017-08-31 NOTE — Procedures (Signed)
RT came into patient's room.  Patient had oxygen off and pulse ox was off.  RT put patient's pulse ox back on and patient's sats were in the mid to upper 80s.  RT put [name] back on patient and put patient on 2L.  Patient's sats came up to 90.  Patient seemed to be in pain.  RT  Notified RN.

## 2017-09-01 ENCOUNTER — Inpatient Hospital Stay (HOSPITAL_COMMUNITY): Payer: Medicare HMO

## 2017-09-01 LAB — GLUCOSE, CAPILLARY
GLUCOSE-CAPILLARY: 407 mg/dL — AB (ref 70–99)
Glucose-Capillary: 309 mg/dL — ABNORMAL HIGH (ref 70–99)
Glucose-Capillary: 354 mg/dL — ABNORMAL HIGH (ref 70–99)

## 2017-09-01 LAB — BASIC METABOLIC PANEL
Anion gap: 12 (ref 5–15)
BUN: 29 mg/dL — ABNORMAL HIGH (ref 6–20)
CALCIUM: 8.9 mg/dL (ref 8.9–10.3)
CO2: 24 mmol/L (ref 22–32)
CREATININE: 1.11 mg/dL — AB (ref 0.44–1.00)
Chloride: 104 mmol/L (ref 98–111)
GFR, EST NON AFRICAN AMERICAN: 56 mL/min — AB (ref >60)
GLUCOSE: 292 mg/dL — AB (ref 70–99)
Potassium: 4.1 mmol/L (ref 3.5–5.1)
Sodium: 140 mmol/L (ref 135–145)

## 2017-09-01 LAB — CBC
HCT: 38.9 % (ref 36.0–46.0)
Hemoglobin: 12.6 g/dL (ref 12.0–15.0)
MCH: 28.4 pg (ref 26.0–34.0)
MCHC: 32.4 g/dL (ref 30.0–36.0)
MCV: 87.8 fL (ref 78.0–100.0)
PLATELETS: 259 10*3/uL (ref 150–400)
RBC: 4.43 MIL/uL (ref 3.87–5.11)
RDW: 18.3 % — AB (ref 11.5–15.5)
WBC: 22.4 10*3/uL — AB (ref 4.0–10.5)

## 2017-09-01 MED ORDER — RESOURCE THICKENUP CLEAR PO POWD
ORAL | Status: DC | PRN
  Filled 2017-09-01: qty 125

## 2017-09-01 MED ORDER — FUROSEMIDE 80 MG PO TABS
80.0000 mg | ORAL_TABLET | Freq: Every day | ORAL | Status: DC
  Administered 2017-09-01 – 2017-09-03 (×3): 80 mg via ORAL
  Filled 2017-09-01 (×3): qty 1

## 2017-09-01 MED ORDER — CLONAZEPAM 0.25 MG PO TBDP
0.2500 mg | ORAL_TABLET | Freq: Three times a day (TID) | ORAL | Status: DC | PRN
  Administered 2017-09-02 – 2017-09-03 (×2): 0.25 mg via ORAL
  Filled 2017-09-01 (×2): qty 1

## 2017-09-01 MED ORDER — ROSUVASTATIN CALCIUM 20 MG PO TABS
20.0000 mg | ORAL_TABLET | Freq: Every day | ORAL | Status: DC
  Administered 2017-09-01 – 2017-09-09 (×9): 20 mg via ORAL
  Filled 2017-09-01 (×9): qty 1

## 2017-09-01 MED ORDER — NICOTINE 21 MG/24HR TD PT24
21.0000 mg | MEDICATED_PATCH | Freq: Every day | TRANSDERMAL | Status: DC
  Administered 2017-09-01 – 2017-09-09 (×8): 21 mg via TRANSDERMAL
  Filled 2017-09-01 (×9): qty 1

## 2017-09-01 MED ORDER — INSULIN ASPART 100 UNIT/ML ~~LOC~~ SOLN: 4.0000 [IU] | Freq: Three times a day (TID) | SUBCUTANEOUS | Status: DC

## 2017-09-01 MED ORDER — TEMAZEPAM 15 MG PO CAPS
15.0000 mg | ORAL_CAPSULE | Freq: Every evening | ORAL | Status: DC | PRN
  Administered 2017-09-02 – 2017-09-03 (×2): 15 mg via ORAL
  Filled 2017-09-01 (×2): qty 1

## 2017-09-01 MED ORDER — FLUTICASONE FUROATE-VILANTEROL 100-25 MCG/INH IN AEPB
1.0000 | INHALATION_SPRAY | Freq: Every day | RESPIRATORY_TRACT | Status: DC
  Administered 2017-09-01 – 2017-09-09 (×10): 1 via RESPIRATORY_TRACT
  Filled 2017-09-01: qty 28

## 2017-09-01 MED ORDER — INSULIN GLARGINE 100 UNIT/ML ~~LOC~~ SOLN
60.0000 [IU] | Freq: Two times a day (BID) | SUBCUTANEOUS | Status: DC
  Administered 2017-09-01 – 2017-09-03 (×4): 60 [IU] via SUBCUTANEOUS
  Filled 2017-09-01 (×7): qty 0.6

## 2017-09-01 NOTE — Consult Note (Signed)
Unable to evaluate patient for capacity for disposition due to confusion and disorientation. Case discussed with Dr. Karleen Hampshire who will re-consult psych when patient is medically stable. Corena Pilgrim, MD Attending psychiatrist.

## 2017-09-01 NOTE — Progress Notes (Signed)
PROGRESS NOTE    [name]  EGB:[id number] DOB: October 03, 1965 DOA: 08/29/2017 PCP: [name], PA-C    Brief Narrative:  [name] is a 52 y.o. female with medical history significant of COPD, type 2 diabetes mellitus, HIV, diastolic heart failure, dyslipidemia and chronic pain syndrome.  Patient was discharged from the hospital August 27 after being treated for COPD exacerbation.  Apparently she lives at home by herself.  She reported experiencing a syncope episode in her bedroom, unclear about prodromal symptoms, but apparently after recovering her consciousness she was not able to stand.  She had a left BKA and uses a prosthesis.  At the time of her fall she was not using the prosthesis.  She spent about 24 to 48 hours on the floor, until she was found by her daughter-in-law who brought her to the hospital.    Assessment & Plan:   Active Problems:   Human immunodeficiency virus (HIV) disease ([place])   Diabetes mellitus type 2 with complications, uncontrolled ([place])   Mood disorder ([place])   Essential hypertension   COPD with acute bronchitis ([place])   Tobacco abuse   Repeated falls   Diabetic neuropathy ([place])   Obesity hypoventilation syndrome ([place])   Hyperlipidemia   CKD (chronic kidney disease), stage III ([name])   Syncope   Syncope: Differential include fall from deconditioning from BKA. vs vaso vagal vs arrhythmias vs dehydration. Suspect probably from dehydration as she was found to be in AKI and hypernatremic.  CT head and cervical spine without any acute findings.  Orthostatic vital signs couldn't calculated due to her BKA.  EKG yesterday shows atrial fibrillation with rate in 90/min, ? Paroxysmal afib. Repeat ekg ordered showed atrial flutter, cardiology consulted, suggested it was artifact.  TSH WNL.  Echocardiogram reviewed.  No new recommendations.     Mild COPD exacerbation:  She is restarted on IV solumedrol and duo nebs every 6 hours and oral  azithromycin for 3 days.  Wheezing has improved, but she continues to cough with eating. Will get SLP evaluation.  Started tapering steroids to oral prednisone from tomorrow.  Short oxygen to keep sats greater than 90%. Pt keeps taking off the oxygen.    Mild rhabdomyolysis Possibly from dehydration. Improving with IV fluids. Stopped IV fluids.    Hypernatremia: resolved with hydration.   AKI on stage 3 CKD:  Possibly pre renal from dehydration.  Improving with IV fluids.  No signs of infection found.    Hypokalemia Replaced.    Elevated liver function tests Possibly from mild rhabdomyolysis. She denies any nausea or vomiting.    Leukocytosis from steroid use.    Chronic diastolic heart failure Appears to be compensated.    Uncontrolled Diabetes mellitus; with hyperglycemia CBG (last 3)  Recent Labs    08/31/17 1714 08/31/17 2123 09/01/17 0818  GLUCAP 456* 331* 309*   not well controlled. Unclear if she is complaint to her diabetic meds at home.  Elevated cbg's probably from steroids.  Changed SSI to resistant scale ,re started home dose of insulin.   HIV: Resume home dose of gENVOYA.    Delirium:  Probably from alcohol use. Though she denied it on admission. Slightly confused too.  Decreased the dose of clonazepam to 0/25 mg TID PRN.  Started her on CIWA. Requiring frequent orientation to the surroundings.    Tobacco abuse;  Counseled today.  Added nicotine patch.         DVT prophylaxis: heparin sq Code Status: FULL  CODE.  Family Communication:none at bedside.  Disposition Plan: pending clinical improvement.   Consultants:   Cardiology.    Procedures: echocardiogram ordered   Antimicrobials: completed azithromycin. .    Subjective: Delirious, tremors. Confused. Taking off the oxygen.    Objective: Vitals:   09/01/17 0121 09/01/17 0340 09/01/17 0750 09/01/17 0804  BP:  98/77  (!) 143/74  Pulse:  88    Resp:  18  13  Temp:  98 F  (36.7 C)  97.9 F (36.6 C)  TempSrc:  Oral  Oral  SpO2: 94% 96% 96%   Weight:      Height:        Intake/Output Summary (Last 24 hours) at 09/01/2017 1114 Last data filed at 09/01/2017 0344 Gross per 24 hour  Intake 912.57 ml  Output 2000 ml  Net -1087.43 ml   Filed Weights   08/29/17 1956  Weight: 106.7 kg    Examination:  General exam: Tremulous, confused, trying to get out of bed, not in distress,  Respiratory system: air entry fair, wheezing has improved. No rhonchi  Cardiovascular system: S1 & S2 heard, RRR. No JVD, murmurs Gastrointestinal system: Abdomen is soft NT ND BS+ Central nervous system: Alert and confused, able to move all extremities.  Extremities: left BKA, right lower extremity shin redness, no pedal edema.  Skin: No rashes, lesions or ulcers Psychiatry:  Mood & affect appropriate.     Data Reviewed: I have personally reviewed following labs and imaging studies  CBC: Recent Labs  Lab 08/25/17 2116 08/26/17 1032 08/29/17 1222 08/30/17 0558 09/01/17 0232  WBC 14.2* 13.5* 15.4* 14.1* 22.4*  NEUTROABS 11.6* 12.3* 13.4*  --   --   HGB 14.2 13.9 16.2* 12.6 12.6  HCT 44.3 45.4 51.8* 39.2 38.9  MCV 91.7 91.7 90.2 88.7 87.8  PLT 250 250 306 221 476   Basic Metabolic Panel: Recent Labs  Lab 08/26/17 1032 08/29/17 1222 08/30/17 0558 08/30/17 1745 08/31/17 0237 09/01/17 0232  NA 141 150* 136  --  136 140  K 4.7 4.7 3.3*  --  4.9 4.1  CL 99 106 100  --  101 104  CO2 26 22 24   --  21* 24  GLUCOSE 449* 109* 156* 402* 430* 292*  BUN 26* 83* 66*  --  42* 29*  CREATININE 1.27* 2.04* 1.67*  --  1.45* 1.11*  CALCIUM 9.6 9.6 8.2*  --  8.6* 8.9   GFR: Estimated Creatinine Clearance: 68.6 mL/min (A) (by C-G formula based on SCr of 1.11 mg/dL (H)). Liver Function Tests: Recent Labs  Lab 08/26/17 1032 08/29/17 1222  AST 18 75*  ALT 20 63*  ALKPHOS 60 86  BILITOT 0.7 2.0*  PROT 6.3* 6.6  ALBUMIN 3.2* 3.2*   Recent Labs  Lab 08/26/17 1032    LIPASE 25   No results for input(s): AMMONIA in the last 168 hours. Coagulation Profile: No results for input(s): INR, PROTIME in the last 168 hours. Cardiac Enzymes: Recent Labs  Lab 08/29/17 1222 08/30/17 0558 08/30/17 0958 08/30/17 1430 08/30/17 2119  CKTOTAL 788* 235*  --   --   --   TROPONINI  --   --  0.04* 0.04* <0.03   BNP (last 3 results) No results for input(s): PROBNP in the last 8760 hours. HbA1C: No results for input(s): HGBA1C in the last 72 hours. CBG: Recent Labs  Lab 08/31/17 0815 08/31/17 1214 08/31/17 1714 08/31/17 2123 09/01/17 0818  GLUCAP 358* 362* 456* 331* 309*  Lipid Profile: No results for input(s): CHOL, HDL, LDLCALC, TRIG, CHOLHDL, LDLDIRECT in the last 72 hours. Thyroid Function Tests: Recent Labs    08/30/17 0958  TSH 1.806   Anemia Panel: No results for input(s): VITAMINB12, FOLATE, FERRITIN, TIBC, IRON, RETICCTPCT in the last 72 hours. Sepsis Labs: Recent Labs  Lab 08/26/17 1024 08/26/17 1252 08/29/17 1233  LATICACIDVEN 1.98* 1.94* 1.60    Recent Results (from the past 240 hour(s))  MRSA PCR Screening     Status: None   Collection Time: 08/29/17  6:24 PM  Result Value Ref Range Status   MRSA by PCR NEGATIVE NEGATIVE Final    Comment:        The GeneXpert MRSA Assay (FDA approved for NASAL specimens only), is one component of a comprehensive MRSA colonization surveillance program. It is not intended to diagnose MRSA infection nor to guide or monitor treatment for MRSA infections. Performed at [facility], [place] [address], [place], [place] [address]          Radiology Studies: No results found.      Scheduled Meds: . ARIPiprazole  15 mg Oral Daily  . doxepin  10 mg Oral QHS  . elvitegravir-cobicistat-emtricitabine-tenofovir  1 tablet Oral Q breakfast  . fenofibrate  160 mg Oral Daily  . heparin  5,000 Units Subcutaneous Q8H  . insulin aspart  0-20 Units Subcutaneous TID WC  . insulin aspart   0-5 Units Subcutaneous QHS  . insulin aspart  4 Units Subcutaneous TID WC  . insulin glargine  10 Units Subcutaneous Daily  . ipratropium-albuterol  3 mL Nebulization Q6H  . magnesium oxide  400 mg Oral Daily  . multivitamin with minerals  1 tablet Oral Daily  . nicotine  21 mg Transdermal Daily  . pantoprazole  40 mg Oral Daily  . polyethylene glycol  17 g Oral Daily  . senna-docusate  2 tablet Oral BID  . sodium chloride flush  3 mL Intravenous Q12H   Continuous Infusions: . sodium chloride 10 mL/hr at 08/31/17 1600     LOS: 3 days    Time spent: 35 minutes.     Hosie Poisson, MD [facility] Pager [phone]   If 7PM-7AM, please contact night-coverage [email or web address] Password [email or web address] 09/01/2017, 11:14 AM

## 2017-09-01 NOTE — Progress Notes (Signed)
Pt. Confused and taking off telemetry leads. Pt. Also states she needs to get out of bed to "go to a meeting". RN spoke with pt to reorient. Pt stated she would not try to get out of bed. Will continue to monitor.

## 2017-09-01 NOTE — Evaluation (Signed)
Clinical/Bedside Swallow Evaluation Patient Details  Name: SINIYA LICHTY MRN: [id number] Date of Birth: 07-11-1965  Today's Date: 09/01/2017 Time: SLP Start Time (ACUTE ONLY): 1155 SLP Stop Time (ACUTE ONLY): 1217 SLP Time Calculation (min) (ACUTE ONLY): 22 min  Past Medical History:  Past Medical History:  Diagnosis Date  . Acute metabolic encephalopathy 5/40/0867  . Acute on chronic respiratory failure with hypoxia ([place]) 05/19/2016  . Anxiety   . ARF (acute renal failure) ([place]) 05/03/2014  . Arthritis    LEFT HIP  . Asthma    HOSPITALIZED WITH EXCERBATION OF ASTHMA - AND BRONCHITIS AND THE FLU DEC 2013  . Asthma exacerbation attacks 12/28/2011  . Bronchitis 12/28/2011  . CAP (community acquired pneumonia) 07/25/2012  . Chronic kidney disease (CKD), stage IV (severe) ([place]) 11/10/2014  . Class 3 obesity due to excess calories with serious comorbidity and body mass index (BMI) of 40.0 to 44.9 in adult   . Colles' fracture of left radius   . COPD (chronic obstructive pulmonary disease) ([place])   . Depression   . Diabetes mellitus    ON INSULIN AND ORAL MEDICATIONS  . Diabetes mellitus type 2 with complications, uncontrolled ([place]) 04/19/2008   Qualifier: Diagnosis of  By: Tomma Lightning MD, Claiborne Billings     . Diabetic foot infection ([place]) 11/09/2014  . Diabetic ketoacidosis without coma associated with type 2 diabetes mellitus ([place])   . Diabetic neuropathy ([place]) 11/10/2014  . Diarrhea 08/30/2008   Qualifier: Diagnosis of  By: Tomma Lightning MD, Claiborne Billings    . Diastolic dysfunction 61/09/5091  . Difficult intravenous access   . DKA, type 2 ([place]) 04/04/2016  . Dyslipidemia 12/23/2008   Qualifier: Diagnosis of  By: Tomma Lightning MD, Claiborne Billings    . Essential hypertension 04/19/2008   Qualifier: Diagnosis of  By: Tomma Lightning MD, Claiborne Billings    . FATIGUE 07/12/2008   Qualifier: Diagnosis of  By: Tomma Lightning MD, Claiborne Billings    . Femoral neck fracture ([place]) 07/16/2011  . Femur fracture, left ([place]) 07/08/2014  . Fever   . Fracture of distal femur  ([place]) 07/08/2014  . GERD (gastroesophageal reflux disease)   . Hereditary and idiopathic peripheral neuropathy 04/19/2008   Qualifier: Diagnosis of  By: Tomma Lightning MD, Claiborne Billings    . Hip fracture, left ([place]) 07/16/2011  . Hip pain   . HIV infection ([place]) 2000  . Human immunodeficiency virus (HIV) disease ([place]) 04/19/2008   [id number] +   . Hyperlipidemia   . Hypertension   . Hyponatremia 12/29/2011  . Influenza A 12/29/2011  . Influenza B   . Insomnia 06/12/2016  . Left hip pain 05/03/2014  . Mood disorder ([place]) 04/19/2008   Qualifier: Diagnosis of  By: Tomma Lightning MD, Claiborne Billings    . Neuropathy    NEUROPATHY HANDS AND FEET  . NSTEMI (non-ST elevated myocardial infarction) ([place]) 05/19/2016  . Obesity hypoventilation syndrome ([place])   . Osteoporosis 07/08/2014  . Pain    SEVERE PAIN LEFT HIP - HX OF LEFT HIP PINNING JULY 2013  . Perimenopausal symptoms 02/13/2012   LMP around 2011. On estrace and provera since around 2012 for hot flashes, mood swings. Estrace 2 mg daily, Provera 2.5 mg for 5 days each month.   . Repeated falls 07/30/2011  . Sepsis ([place]) 04/04/2016  . Shortness of breath    ALLERGIES ARE "ACTING UP"  . SOB (shortness of breath)   . THRUSH 05/20/2008   Qualifier: Diagnosis of  By: Tomma Lightning MD, Claiborne Billings      . Tobacco use disorder 04/14/2010  Past Surgical History:  Past Surgical History:  Procedure Laterality Date  . AMPUTATION Left 11/18/2014   Procedure: LEFT BELOW KNEE AMPUTATION;  Surgeon: Leandrew Koyanagi, MD;  Location: [facility];  Service: Orthopedics;  Laterality: Left;  . CAST APPLICATION Left 3/0/1601   Procedure: CAST APPLICATION (FIBERGLASS);  Surgeon: Latanya Maudlin, MD;  Location: [facility];  Service: Orthopedics;  Laterality: Left;  CLOSED REDUCTION OF LEFT COLLES FRACTURE WITH SHORT ARM CAST  . HARDWARE REMOVAL Left 05/05/2014   Procedure: HARDWARE REMOVAL LEFT HIP;  Surgeon: Latanya Maudlin, MD;  Location: [facility];  Service: Orthopedics;  Laterality: Left;  REMOVAL BIOMET 6.5-8.0 CANNULATED  SCREW  . HIP ARTHROPLASTY Left 05/05/2014   Procedure: ARTHROPLASTY OPEN REDUCTION INTERNAL FIXATION LEFT HIP AND REMOVAL OF TWO CANNULATED SCREW;  Surgeon: Latanya Maudlin, MD;  Location: [facility];  Service: Orthopedics;  Laterality: Left;  . HIP PINNING,CANNULATED  07/16/2011   Procedure: CANNULATED HIP PINNING;  Surgeon: Gearlean Alf, MD;  Location: [facility];  Service: Orthopedics;  Laterality: Left;  . HIP PINNING,CANNULATED Left 04/09/2012   Procedure: CANNULATED HIP PINNING AND HARDWARE REVISION;  Surgeon: Gearlean Alf, MD;  Location: [facility];  Service: Orthopedics;  Laterality: Left;  . I&D EXTREMITY Left 11/16/2014   Procedure:  DEBRIDEMENT OF LEFT FOOT POSSIBLE APPLICATION OF INTEGRIA AND VAC ;  Surgeon: Irene Limbo, MD;  Location: [facility];  Service: Plastics;  Laterality: Left;  . PERIPHERAL VASCULAR CATHETERIZATION N/A 11/15/2014   Procedure: Abdominal Aortogram;  Surgeon: Conrad Riegelsville, MD;  Location: [facility];  Service: Cardiovascular;  Laterality: N/A;  . PERIPHERAL VASCULAR CATHETERIZATION  11/15/2014   Procedure: Lower Extremity Angiography;  Surgeon: Conrad Woodstock, MD;  Location: [facility];  Service: Cardiovascular;;  . PERIPHERAL VASCULAR CATHETERIZATION Left 11/15/2014   Procedure: Peripheral Vascular Intervention;  Surgeon: Conrad Eunice, MD;  Location: [facility];  Service: Cardiovascular;  Laterality: Left;  popliteal artery stenting   HPI:  [name] is a 52 y.o. female with medical history significant of COPD, type 2 diabetes mellitus, HIV, diastolic heart failure, dyslipidemia and chronic pain syndrome.  Patient was discharged from the hospital August 27 after being treated for COPD exacerbation.  Apparently she lives at home by herself.  She reported experiencing a syncope episode in her bedroom, unclear about prodromal symptoms, but apparently after recovering her consciousness she was not able to stand.  She had a left BKA and uses a  prosthesis.  At the time of her fall she was not using the prosthesis.  She spent about 24 to 48 hours on the floor, until she was found by her daughter-in-law who brought her to the hospital. hos been observed to cough while eating. no SLP history in chart.    Assessment / Plan / Recommendation Clinical Impression  Pt demonstrates no immediate signs of aspiration other than a delayed cough following 3 oz water test. Given that pt also coughs frequently at baseline, this evidence is not strongly convincing for aspiration. However given observation by staff and concern for increasing delirium, will modify liquids to nectar thick as an added precaution and f/u for need for objective testing or return to baseline diet. Discussed with RN.  SLP Visit Diagnosis: Dysphagia, oropharyngeal phase (R13.12)    Aspiration Risk  Mild aspiration risk    Diet Recommendation Dysphagia 3 (Mech soft);Nectar-thick liquid   Liquid Administration via: Cup;Straw Medication Administration: Whole meds with liquid Supervision: Staff to assist with  self feeding Compensations: Slow rate;Small sips/bites Postural Changes: Seated upright at 90 degrees    Other  Recommendations Oral Care Recommendations: Oral care BID   Follow up Recommendations Skilled Nursing facility      Frequency and Duration min 2x/week  2 weeks       Prognosis Prognosis for Safe Diet Advancement: Good      Swallow Study   General HPI: IVORI STORR is a 52 y.o. female with medical history significant of COPD, type 2 diabetes mellitus, HIV, diastolic heart failure, dyslipidemia and chronic pain syndrome.  Patient was discharged from the hospital August 27 after being treated for COPD exacerbation.  Apparently she lives at home by herself.  She reported experiencing a syncope episode in her bedroom, unclear about prodromal symptoms, but apparently after recovering her consciousness she was not able to stand.  She had a left BKA and uses a  prosthesis.  At the time of her fall she was not using the prosthesis.  She spent about 24 to 48 hours on the floor, until she was found by her daughter-in-law who brought her to the hospital. hos been observed to cough while eating. no SLP history in chart.  Type of Study: Bedside Swallow Evaluation Previous Swallow Assessment: none Diet Prior to this Study: Regular;Thin liquids Temperature Spikes Noted: No Respiratory Status: Nasal cannula History of Recent Intubation: No Behavior/Cognition: Alert;Confused Oral Cavity Assessment: Within Functional Limits Oral Care Completed by SLP: No Oral Cavity - Dentition: Edentulous Vision: Functional for self-feeding Self-Feeding Abilities: Able to feed self Patient Positioning: Upright in bed Baseline Vocal Quality: Normal Volitional Cough: Congested    Oral/Motor/Sensory Function Overall Oral Motor/Sensory Function: Within functional limits   Ice Chips     Thin Liquid Thin Liquid: Impaired Presentation: Cup;Straw Pharyngeal  Phase Impairments: Cough - Delayed    Nectar Thick Nectar Thick Liquid: Within functional limits Presentation: Straw   Honey Thick Honey Thick Liquid: Not tested   Puree Puree: Within functional limits Presentation: Spoon   Solid     Solid: Within functional limits     [facility], MA CCC-SLP [phone]  Demonica, Farrey 09/01/2017,12:24 PM

## 2017-09-01 NOTE — Progress Notes (Signed)
Patient required assist during breakfast.  Unable to maintain focus on tasks and frequently has flight of ideas.  Is able to follow one to two stage commands, but often gets "lost".  States she knows she is at the hospital, but is not able to tell me how she arrived, or the correct date.  Has upper airway congestion, especially after eating.  Coughing at times to clear airway, but continues to sound rhonchus.

## 2017-09-01 NOTE — Progress Notes (Signed)
WBC count noted to be 22.4.  Patient confused.  Requires frequent re-orientation.  MD notified.

## 2017-09-01 NOTE — Progress Notes (Signed)
Patient refusing to eat.  Pulled out IV line and attempting to pull out foley catheter.  Verbally abusive to staff.  Symptoms of delirium present.  Current CIWA score = 13.  Patient required use of mitts combined with tele-sitter to maintain optimum environment for safety.

## 2017-09-02 LAB — URINALYSIS, ROUTINE W REFLEX MICROSCOPIC
Bilirubin Urine: NEGATIVE
GLUCOSE, UA: NEGATIVE mg/dL
Ketones, ur: NEGATIVE mg/dL
Nitrite: NEGATIVE
PROTEIN: NEGATIVE mg/dL
Specific Gravity, Urine: 1.01 (ref 1.005–1.030)
pH: 5 (ref 5.0–8.0)

## 2017-09-02 LAB — CBC
HEMATOCRIT: 42.7 % (ref 36.0–46.0)
Hemoglobin: 13.8 g/dL (ref 12.0–15.0)
MCH: 28.2 pg (ref 26.0–34.0)
MCHC: 32.3 g/dL (ref 30.0–36.0)
MCV: 87.3 fL (ref 78.0–100.0)
PLATELETS: 327 10*3/uL (ref 150–400)
RBC: 4.89 MIL/uL (ref 3.87–5.11)
RDW: 18.6 % — ABNORMAL HIGH (ref 11.5–15.5)
WBC: 22.5 10*3/uL — AB (ref 4.0–10.5)

## 2017-09-02 LAB — BASIC METABOLIC PANEL
ANION GAP: 12 (ref 5–15)
BUN: 29 mg/dL — ABNORMAL HIGH (ref 6–20)
CALCIUM: 8.9 mg/dL (ref 8.9–10.3)
CO2: 33 mmol/L — ABNORMAL HIGH (ref 22–32)
Chloride: 96 mmol/L — ABNORMAL LOW (ref 98–111)
Creatinine, Ser: 1.38 mg/dL — ABNORMAL HIGH (ref 0.44–1.00)
GFR calc Af Amer: 50 mL/min — ABNORMAL LOW (ref >60)
GFR calc non Af Amer: 43 mL/min — ABNORMAL LOW (ref >60)
GLUCOSE: 434 mg/dL — AB (ref 70–99)
POTASSIUM: 3.2 mmol/L — AB (ref 3.5–5.1)
SODIUM: 141 mmol/L (ref 135–145)

## 2017-09-02 LAB — GLUCOSE, CAPILLARY
GLUCOSE-CAPILLARY: 433 mg/dL — AB (ref 70–99)
GLUCOSE-CAPILLARY: 190 mg/dL — AB (ref 70–99)
Glucose-Capillary: 346 mg/dL — ABNORMAL HIGH (ref 70–99)
Glucose-Capillary: 158 mg/dL — ABNORMAL HIGH (ref 70–99)

## 2017-09-02 MED ORDER — POTASSIUM CHLORIDE CRYS ER 20 MEQ PO TBCR
40.0000 meq | EXTENDED_RELEASE_TABLET | Freq: Once | ORAL | Status: AC
  Administered 2017-09-02: 40 meq via ORAL
  Filled 2017-09-02: qty 2

## 2017-09-02 MED ORDER — IPRATROPIUM-ALBUTEROL 0.5-2.5 (3) MG/3ML IN SOLN: 3.0000 mL | Freq: Four times a day (QID) | RESPIRATORY_TRACT | Status: DC | PRN

## 2017-09-02 NOTE — Progress Notes (Signed)
  Speech Language Pathology Treatment: Dysphagia  Patient Details Name: [name] MRN: [id number] DOB: [birth year] Today's Date: 09/02/2017 Time: 9450-3888 SLP Time Calculation (min) (ACUTE ONLY): 11 min  Assessment / Plan / Recommendation Clinical Impression  Pt presented with baseline congested cough and slightly altered mentation, although she was alert and cooperative throughout the session. She exhibited intermitent immediate coughing episodes across trials of thin from cup and straw. Pt required min-mod verbal and tactile cues to use slow rate and small sips throughout. Given intermitent s/s aspiration with thin today, SLP recommends continue current dysphagia 3 diet with nectar thick liquids and defer decision regarding diet advancement until instrumental MBS evaluation is complete - scheduled for tomorrow.    HPI HPI: [name] is a 52 y.o. female with medical history significant of COPD, type 2 diabetes mellitus, HIV, diastolic heart failure, dyslipidemia and chronic pain syndrome.  Patient was discharged from the hospital August 27 after being treated for COPD exacerbation.  Apparently she lives at home by herself.  She reported experiencing a syncope episode in her bedroom, unclear about prodromal symptoms, but apparently after recovering her consciousness she was not able to stand.  She had a left BKA and uses a prosthesis.  At the time of her fall she was not using the prosthesis.  She spent about 24 to 48 hours on the floor, until she was found by her daughter-in-law who brought her to the hospital. hos been observed to cough while eating. no SLP history in chart.       SLP Plan  MBS       Recommendations  Diet recommendations: Dysphagia 3 (mechanical soft);Nectar-thick liquid Liquids provided via: Cup;Straw Medication Administration: Whole meds with liquid Supervision: Patient able to self feed Compensations: Slow rate;Small sips/bites Postural Changes and/or  Swallow Maneuvers: Seated upright 90 degrees                Oral Care Recommendations: Oral care BID Follow up Recommendations: Skilled Nursing facility SLP Visit Diagnosis: Dysphagia, oropharyngeal phase (R13.12) Plan: [place], Student SLP               Jettie Booze 09/02/2017, 2:03 PM

## 2017-09-02 NOTE — Progress Notes (Signed)
PROGRESS NOTE    ERAINA [name]  MVE:[id number] DOB: [birth year] DOA: 08/29/2017 PCP: [name], PA-C    Brief Narrative:  [name] is a 52 y.o. female with medical history significant of COPD, type 2 diabetes mellitus, HIV, diastolic heart failure, dyslipidemia and chronic pain syndrome.  Patient was discharged from the hospital August 27 after being treated for COPD exacerbation.  Apparently she lives at home by herself.  She reported experiencing a syncope episode in her bedroom, unclear about prodromal symptoms, but apparently after recovering her consciousness she was not able to stand.  She had a left BKA and uses a prosthesis.  At the time of her fall she was not using the prosthesis.  She spent about 24 to 48 hours on the floor, until she was found by her daughter-in-law who brought her to the hospital.    Assessment & Plan:   Active Problems:   Human immunodeficiency virus (HIV) disease ([place])   Diabetes mellitus type 2 with complications, uncontrolled ([place])   Mood disorder ([place])   Essential hypertension   COPD with acute bronchitis ([place])   Tobacco abuse   Repeated falls   Diabetic neuropathy ([place])   Obesity hypoventilation syndrome ([place])   Hyperlipidemia   CKD (chronic kidney disease), stage III ([name])   Syncope   Syncope: Differential include fall from deconditioning from BKA. vs vaso vagal vs arrhythmias vs dehydration. Suspect probably from dehydration as she was found to be in AKI and hypernatremic.  CT head and cervical spine without any acute findings.  Orthostatic vital signs couldn't calculated due to her BKA.  EKG on admission shows atrial fibrillation with rate in 90/min, ? Paroxysmal afib. Repeat ekg ordered showed atrial flutter, cardiology consulted, suggested it was artifact. She continues to have tremors.  TSH WNL.  Echocardiogram reviewed.  No new recommendations.     Mild COPD exacerbation:  She is restarted on IV solumedrol and duo  nebs every 6 hours and oral azithromycin for 3 days. Completed the course. Pt was coughing while eating, SLP evaluation recommending dysphagia 3  Diet. Wheezing has improved, steroid discontinued.  [place] oxygen to keep sats greater than 90%. Pt at home reports being on 3lit of [place] oxygen.    Mild rhabdomyolysis Possibly from dehydration. Improved.    Hypernatremia: resolved with hydration.   AKI on stage 3 CKD:  Possibly pre renal from dehydration.  Improving with IV fluids.  No signs of infection found on UA.    Hypokalemia Replaced.    Elevated liver function tests Possibly from mild rhabdomyolysis. She denies any nausea or vomiting.    Leukocytosis from steroid use.    Chronic diastolic heart failure Appears to be compensated.    Uncontrolled Diabetes mellitus; with hyperglycemia CBG (last 3)  Recent Labs    09/01/17 1714 09/02/17 0818 09/02/17 1148  GLUCAP 354* 346* 433*   not well controlled. Restarted her on home meds.  Pt is not on prednisone.     HIV: Resume home dose of gENVOYA.    Delirium/ acute encephalopathy: Probably from alcohol use. Though she denied it on admission. Decreased the dose of clonazepam to 0/25 mg TID PRN.  Started her on CIWA. Requiring frequent orientation to the surroundings. Initial CT head without contrast negative for acute stroke.  MRI brain without contrast ordered for evaluation of stroke.    Tobacco abuse;  Counseled today.  Added nicotine patch.   Hypokalemia:  Replaced.       DVT prophylaxis:  heparin sq Code Status: FULL CODE.  Family Communication:none at bedside. Discussed with daughter in law over thep hone on 9/1 Disposition Plan: pending clinical improvement.   Consultants:   Cardiology.    Procedures: echocardiogram ordered   Antimicrobials: completed azithromycin. .    Subjective: Delirious, tremors. Confused. No chest pain or sob.  No wheezing today.   Objective: Vitals:   09/02/17 0711  09/02/17 0714 09/02/17 0800 09/02/17 1225  BP:   126/71   Pulse:   99   Resp:   15   Temp:   98.5 F (36.9 C) (!) 97.5 F (36.4 C)  TempSrc:   Oral Oral  SpO2: 100% 100% 100%   Weight:      Height:        Intake/Output Summary (Last 24 hours) at 09/02/2017 1246 Last data filed at 09/02/2017 0400 Gross per 24 hour  Intake 363 ml  Output 2575 ml  Net -2212 ml   Filed Weights   08/29/17 1956  Weight: 106.7 kg    Examination:  General exam: Tremulous, confused, not in distress, on 3lit of [place] oxygen.  Respiratory system: scattered wheezing heard. No rhonchi.  Cardiovascular system: S1 & S2 heard, RRR. No JVD, murmurs Gastrointestinal system: Abdomen is soft non tender non distended bowel sounds heard.  Central nervous system: Alert and confused, able to move all extremities.  Extremities: left BKA,  no pedal edema.  Skin: No rashes, lesions or ulcers Psychiatry:  Mood & affect appropriate.     Data Reviewed: I have personally reviewed following labs and imaging studies  CBC: Recent Labs  Lab 08/29/17 1222 08/30/17 0558 09/01/17 0232  WBC 15.4* 14.1* 22.4*  NEUTROABS 13.4*  --   --   HGB 16.2* 12.6 12.6  HCT 51.8* 39.2 38.9  MCV 90.2 88.7 87.8  PLT 306 221 161   Basic Metabolic Panel: Recent Labs  Lab 08/29/17 1222 08/30/17 0558 08/30/17 1745 08/31/17 0237 09/01/17 0232  NA 150* 136  --  136 140  K 4.7 3.3*  --  4.9 4.1  CL 106 100  --  101 104  CO2 22 24  --  21* 24  GLUCOSE 109* 156* 402* 430* 292*  BUN 83* 66*  --  42* 29*  CREATININE 2.04* 1.67*  --  1.45* 1.11*  CALCIUM 9.6 8.2*  --  8.6* 8.9   GFR: Estimated Creatinine Clearance: 68.6 mL/min (A) (by C-G formula based on SCr of 1.11 mg/dL (H)). Liver Function Tests: Recent Labs  Lab 08/29/17 1222  AST 75*  ALT 63*  ALKPHOS 86  BILITOT 2.0*  PROT 6.6  ALBUMIN 3.2*   No results for input(s): LIPASE, AMYLASE in the last 168 hours. No results for input(s): AMMONIA in the last 168  hours. Coagulation Profile: No results for input(s): INR, PROTIME in the last 168 hours. Cardiac Enzymes: Recent Labs  Lab 08/29/17 1222 08/30/17 0558 08/30/17 0958 08/30/17 1430 08/30/17 2119  CKTOTAL 788* 235*  --   --   --   TROPONINI  --   --  0.04* 0.04* <0.03   BNP (last 3 results) No results for input(s): PROBNP in the last 8760 hours. HbA1C: No results for input(s): HGBA1C in the last 72 hours. CBG: Recent Labs  Lab 09/01/17 0818 09/01/17 1225 09/01/17 1714 09/02/17 0818 09/02/17 1148  GLUCAP 309* 407* 354* 346* 433*   Lipid Profile: No results for input(s): CHOL, HDL, LDLCALC, TRIG, CHOLHDL, LDLDIRECT in the last 72 hours. Thyroid Function Tests:  No results for input(s): TSH, T4TOTAL, FREET4, T3FREE, THYROIDAB in the last 72 hours. Anemia Panel: No results for input(s): VITAMINB12, FOLATE, FERRITIN, TIBC, IRON, RETICCTPCT in the last 72 hours. Sepsis Labs: Recent Labs  Lab 08/26/17 1252 08/29/17 1233  LATICACIDVEN 1.94* 1.60    Recent Results (from the past 240 hour(s))  MRSA PCR Screening     Status: None   Collection Time: 08/29/17  6:24 PM  Result Value Ref Range Status   MRSA by PCR NEGATIVE NEGATIVE Final    Comment:        The GeneXpert MRSA Assay (FDA approved for NASAL specimens only), is one component of a comprehensive MRSA colonization surveillance program. It is not intended to diagnose MRSA infection nor to guide or monitor treatment for MRSA infections. Performed at [facility], [place] [address] [place], [place] [address]          Radiology Studies: Dg Chest Port 1 View  Result Date: 09/01/2017 CLINICAL DATA:  Followup chest congestion EXAM: PORTABLE CHEST 1 VIEW COMPARISON:  Three days ago FINDINGS: Cardiomegaly. Airway thickening with cuffing and tram track appearance. Lung volumes are low and there is mild dependent atelectasis. There is no edema, consolidation, effusion, or pneumothorax. Remote bilateral rib  fractures. IMPRESSION: Cardiomegaly and chronic bronchitic markings.  No acute finding. Electronically Signed   By: Monte Fantasia M.D.   On: 09/01/2017 14:18        Scheduled Meds: . ARIPiprazole  15 mg Oral Daily  . doxepin  10 mg Oral QHS  . elvitegravir-cobicistat-emtricitabine-tenofovir  1 tablet Oral Q breakfast  . fenofibrate  160 mg Oral Daily  . fluticasone furoate-vilanterol  1 puff Inhalation Daily  . furosemide  80 mg Oral Daily  . heparin  5,000 Units Subcutaneous Q8H  . insulin aspart  0-20 Units Subcutaneous TID WC  . insulin aspart  0-5 Units Subcutaneous QHS  . insulin glargine  60 Units Subcutaneous BID  . magnesium oxide  400 mg Oral Daily  . multivitamin with minerals  1 tablet Oral Daily  . nicotine  21 mg Transdermal Daily  . pantoprazole  40 mg Oral Daily  . polyethylene glycol  17 g Oral Daily  . rosuvastatin  20 mg Oral Daily  . senna-docusate  2 tablet Oral BID  . sodium chloride flush  3 mL Intravenous Q12H   Continuous Infusions: . sodium chloride 10 mL/hr at 08/31/17 1600     LOS: 4 days    Time spent: 35 minutes.     Hosie Poisson, MD [facility] Pager [phone]   If 7PM-7AM, please contact night-coverage [email or web address] Password [email or web address] 09/02/2017, 12:46 PM

## 2017-09-02 NOTE — Evaluation (Signed)
Occupational Therapy Evaluation Patient Details Name: [name] MRN: [id number] DOB: 03-11-1965 Today's Date: 09/02/2017    History of Present Illness Pt is a 52 y.o. female admitted 08/29/17 after reportedly having syncopal episode with fall at home and being found down 24-48 hours later. Suspect syncopal episode due to dehydration; pt found to be in AKI and hypernatremic. Mild rhabdomyolysis. Increased confusion while she has been here. CT head and cervical spine clear. PMH includes COPD, DMII, HIV, HF, chronic pain, L BKA.    Clinical Impression   This 52 yo female admitted with above presents to acute OT with confusion, decreased mobility, and decreased safety awareness thus affecting pt's ability to care for herself at home as she was pta (lived at home alone getting around with RW or W/C and prothesis doing her own basic and IADLs per her report with neighbor taking her to store). She will benefit from acute OT with follow up OT at SNF.    Follow Up Recommendations  SNF;Supervision/Assistance - 24 hour    Equipment Recommendations  Other (comment)(TBD at next venue)       Precautions / Restrictions Precautions Precautions: Fall Precaution Comments: L BKA (prosthetic now here) Restrictions Weight Bearing Restrictions: No      Mobility Bed Mobility Overal bed mobility: Needs Assistance Bed Mobility: Rolling Rolling: Max assist         General bed mobility comments: Willing to work on getting up and OOB, but with only 1 person did not feel this was safe given her confusion and the fact that why she wanted to get up was leave and go to store         ADL either performed or assessed with clinical judgement   ADL Overall ADL's : Needs assistance/impaired Eating/Feeding: Supervision/ safety;Set up;Bed level   Grooming: Minimal assistance;Bed level   Upper Body Bathing: Minimal assistance;Bed level   Lower Body Bathing: Total assistance;Bed level   Upper Body  Dressing : Maximal assistance;Bed level   Lower Body Dressing: Total assistance;Bed level                       Vision Baseline Vision/History: Wears glasses Wears Glasses: At all times              Pertinent Vitals/Pain Pain Location: Pt reports her right knee hurts, but no signs of hurting when she moves it or I assist her with moving it Pain Intervention(s): Monitored during session;Repositioned     Hand Dominance  right   Extremity/Trunk Assessment Upper Extremity Assessment Upper Extremity Assessment: Overall WFL for tasks assessed           Communication  No issues   Cognition Arousal/Alertness: Awake/alert Behavior During Therapy: Flat affect Overall Cognitive Status: No family/caregiver present to determine baseline cognitive functioning Area of Impairment: Orientation;Attention;Memory;Following commands;Safety/judgement;Problem solving                 Orientation Level: Disoriented to;Place;Time;Situation Current Attention Level: Sustained Memory: Decreased recall of precautions Following Commands: Follows one step commands with increased time Safety/Judgement: Decreased awareness of safety;Decreased awareness of deficits   Problem Solving: Difficulty sequencing;Requires verbal cues;Requires tactile cues General Comments: "Nothing against you, but I am so tired of everyone asking me that" (where are you)--she did not answer until I asked again (drive in on [address]), says she needs to get up and go to store to get some hamburger mulitple times (despite re-orienting her that she is in the hospital)  Home Living Family/patient expects to be discharged to:: Skilled nursing facility                                                 OT Problem List: Decreased strength;Impaired balance (sitting and/or standing);Pain;Decreased cognition      OT Treatment/Interventions: Self-care/ADL training;Balance  training;DME and/or AE instruction;Patient/family education;Therapeutic activities;Cognitive remediation/compensation    OT Goals(Current goals can be found in the care plan section) Acute Rehab OT Goals Patient Stated Goal: to get up and to to grocery store to get some hamburger OT Goal Formulation: Patient unable to participate in goal setting Time For Goal Achievement: 09/16/17 Potential to Achieve Goals: Fair  OT Frequency: Min 2X/week   Barriers to D/C: Decreased caregiver support             AM-PAC PT "6 Clicks" Daily Activity     Outcome Measure Help from another person eating meals?: A Little Help from another person taking care of personal grooming?: A Little Help from another person toileting, which includes using toliet, bedpan, or urinal?: Total Help from another person bathing (including washing, rinsing, drying)?: A Lot Help from another person to put on and taking off regular upper body clothing?: A Lot Help from another person to put on and taking off regular lower body clothing?: Total 6 Click Score: 12   End of Session Nurse Communication: (confusion still persists; left with bed low, alarm on, and mits back on)  Activity Tolerance: Patient tolerated treatment well Patient left: in bed;with call bell/phone within reach;with bed alarm set  OT Visit Diagnosis: Other abnormalities of gait and mobility (R26.89);Muscle weakness (generalized) (M62.81);Other symptoms and signs involving cognitive function;Pain Pain - Right/Left: Right Pain - part of body: Knee                Time: 6433-2951 OT Time Calculation (min): 17 min Charges:  OT General Charges $OT Visit: 1 Visit OT Evaluation $OT Eval Moderate Complexity: 1 Mod  Golden Circle, OTR/L Acute [facility] [phone] Office [phone]

## 2017-09-03 ENCOUNTER — Inpatient Hospital Stay (HOSPITAL_COMMUNITY): Payer: Medicare HMO

## 2017-09-03 LAB — BASIC METABOLIC PANEL
Anion gap: 14 (ref 5–15)
BUN: 31 mg/dL — ABNORMAL HIGH (ref 6–20)
CALCIUM: 9.3 mg/dL (ref 8.9–10.3)
CO2: 33 mmol/L — AB (ref 22–32)
Chloride: 97 mmol/L — ABNORMAL LOW (ref 98–111)
Creatinine, Ser: 1.29 mg/dL — ABNORMAL HIGH (ref 0.44–1.00)
GFR, EST AFRICAN AMERICAN: 54 mL/min — AB (ref >60)
GFR, EST NON AFRICAN AMERICAN: 46 mL/min — AB (ref >60)
GLUCOSE: 158 mg/dL — AB (ref 70–99)
Potassium: 3 mmol/L — ABNORMAL LOW (ref 3.5–5.1)
Sodium: 144 mmol/L (ref 135–145)

## 2017-09-03 LAB — GLUCOSE, CAPILLARY
GLUCOSE-CAPILLARY: 207 mg/dL — AB (ref 70–99)
GLUCOSE-CAPILLARY: 156 mg/dL — AB (ref 70–99)
Glucose-Capillary: 211 mg/dL — ABNORMAL HIGH (ref 70–99)
Glucose-Capillary: 113 mg/dL — ABNORMAL HIGH (ref 70–99)

## 2017-09-03 MED ORDER — INSULIN REGULAR HUMAN (CONC) 500 UNIT/ML ~~LOC~~ SOPN
60.0000 [IU] | PEN_INJECTOR | Freq: Every day | SUBCUTANEOUS | Status: DC
  Filled 2017-09-03: qty 3

## 2017-09-03 MED ORDER — LORAZEPAM 2 MG/ML IJ SOLN
2.0000 mg | Freq: Once | INTRAMUSCULAR | Status: AC
  Administered 2017-09-03: 2 mg via INTRAVENOUS
  Filled 2017-09-03: qty 1

## 2017-09-03 MED ORDER — INSULIN REGULAR HUMAN (CONC) 500 UNIT/ML ~~LOC~~ SOPN
35.0000 [IU] | PEN_INJECTOR | Freq: Every day | SUBCUTANEOUS | Status: DC
  Administered 2017-09-03: 35 [IU] via SUBCUTANEOUS
  Filled 2017-09-03: qty 3

## 2017-09-03 MED ORDER — INSULIN REGULAR HUMAN (CONC) 500 UNIT/ML ~~LOC~~ SOPN: 50.0000 [IU] | PEN_INJECTOR | Freq: Every day | SUBCUTANEOUS | Status: DC

## 2017-09-03 MED ORDER — ORAL CARE MOUTH RINSE
15.0000 mL | Freq: Two times a day (BID) | OROMUCOSAL | Status: DC
  Administered 2017-09-04 – 2017-09-07 (×5): 15 mL via OROMUCOSAL

## 2017-09-03 MED ORDER — INSULIN GLARGINE 100 UNIT/ML ~~LOC~~ SOLN
40.0000 [IU] | Freq: Once | SUBCUTANEOUS | Status: AC
  Administered 2017-09-03: 40 [IU] via SUBCUTANEOUS
  Filled 2017-09-03: qty 0.4

## 2017-09-03 NOTE — ED Provider Notes (Signed)
[facility] DEPT Provider Note   CSN: [id number] Arrival date & time: 08/25/17  2047     History   Chief Complaint Chief Complaint  Patient presents with  . Shortness of Breath    HPI [name] is a 52 y.o. female.  HPI   52 year old female with dyspnea.  Past history of COPD chronically on 3 L of oxygen at baseline.  She reports worsening shortness of breath this evening.  Denies any acute pain.  No fevers or chills.  She has chronic nonproductive cough.  Prior to arrival to the hospital she received 125 mg of Solu-Medrol, and milligrams albuterol and 1 mg of Atrovent.  Feels like she got significant improvement from these meds. Past Medical History:  Diagnosis Date  . Acute metabolic encephalopathy 7/67/2094  . Acute on chronic respiratory failure with hypoxia ([place]) 05/19/2016  . Anxiety   . ARF (acute renal failure) ([place]) 05/03/2014  . Arthritis    LEFT HIP  . Asthma    HOSPITALIZED WITH EXCERBATION OF ASTHMA - AND BRONCHITIS AND THE FLU DEC 2013  . Asthma exacerbation attacks 12/28/2011  . Bronchitis 12/28/2011  . CAP (community acquired pneumonia) 07/25/2012  . Chronic kidney disease (CKD), stage IV (severe) ([place]) 11/10/2014  . Class 3 obesity due to excess calories with serious comorbidity and body mass index (BMI) of 40.0 to 44.9 in adult   . Colles' fracture of left radius   . COPD (chronic obstructive pulmonary disease) ([place])   . Depression   . Diabetes mellitus    ON INSULIN AND ORAL MEDICATIONS  . Diabetes mellitus type 2 with complications, uncontrolled ([place]) 04/19/2008   Qualifier: Diagnosis of  By: Tomma Lightning MD, Claiborne Billings     . Diabetic foot infection ([place]) 11/09/2014  . Diabetic ketoacidosis without coma associated with type 2 diabetes mellitus ([place])   . Diabetic neuropathy ([place]) 11/10/2014  . Diarrhea 08/30/2008   Qualifier: Diagnosis of  By: Tomma Lightning MD, Claiborne Billings    . Diastolic dysfunction 70/09/6281  . Difficult intravenous access    . DKA, type 2 ([place]) 04/04/2016  . Dyslipidemia 12/23/2008   Qualifier: Diagnosis of  By: Tomma Lightning MD, Claiborne Billings    . Essential hypertension 04/19/2008   Qualifier: Diagnosis of  By: Tomma Lightning MD, Claiborne Billings    . FATIGUE 07/12/2008   Qualifier: Diagnosis of  By: Tomma Lightning MD, Claiborne Billings    . Femoral neck fracture ([place]) 07/16/2011  . Femur fracture, left ([place]) 07/08/2014  . Fever   . Fracture of distal femur ([place]) 07/08/2014  . GERD (gastroesophageal reflux disease)   . Hereditary and idiopathic peripheral neuropathy 04/19/2008   Qualifier: Diagnosis of  By: Tomma Lightning MD, Claiborne Billings    . Hip fracture, left ([place]) 07/16/2011  . Hip pain   . HIV infection ([place]) 2000  . Human immunodeficiency virus (HIV) disease ([place]) 04/19/2008   [id number] +   . Hyperlipidemia   . Hypertension   . Hyponatremia 12/29/2011  . Influenza A 12/29/2011  . Influenza B   . Insomnia 06/12/2016  . Left hip pain 05/03/2014  . Mood disorder ([place]) 04/19/2008   Qualifier: Diagnosis of  By: Tomma Lightning MD, Claiborne Billings    . Neuropathy    NEUROPATHY HANDS AND FEET  . NSTEMI (non-ST elevated myocardial infarction) ([place]) 05/19/2016  . Obesity hypoventilation syndrome ([place])   . Osteoporosis 07/08/2014  . Pain    SEVERE PAIN LEFT HIP - HX OF LEFT HIP PINNING JULY 2013  . Perimenopausal symptoms 02/13/2012   LMP  around 2011. On estrace and provera since around 2012 for hot flashes, mood swings. Estrace 2 mg daily, Provera 2.5 mg for 5 days each month.   . Repeated falls 07/30/2011  . Sepsis ([place]) 04/04/2016  . Shortness of breath    ALLERGIES ARE "ACTING UP"  . SOB (shortness of breath)   . THRUSH 05/20/2008   Qualifier: Diagnosis of  By: Tomma Lightning MD, Claiborne Billings      . Tobacco use disorder 04/14/2010    Patient Active Problem List   Diagnosis Date Noted  . Syncope 08/29/2017  . DKA (diabetic ketoacidoses) ([place]) 03/02/2017  . COPD exacerbation ([place]) 02/23/2017  . Acute on chronic respiratory failure with hypoxia and hypercapnia ([place]) 08/19/2016  . Leukocytosis 08/19/2016    . CKD (chronic kidney disease), stage III ([place]) 08/19/2016  . Altered mental status 08/19/2016  . Asthma exacerbation 06/18/2016  . Insomnia 06/12/2016  . Difficult intravenous access   . Acute on chronic respiratory failure with hypoxia ([place]) 05/19/2016  . NSTEMI (non-ST elevated myocardial infarction) ([place]) 05/19/2016  . Acute metabolic encephalopathy 15/40/0867  . Hyperlipidemia   . Class 3 obesity due to excess calories with serious comorbidity and body mass index (BMI) of 40.0 to 44.9 in adult   . Obesity hypoventilation syndrome ([place])   . Diastolic dysfunction 61/95/0932  . Diabetic neuropathy ([place]) 11/10/2014  . Fracture of distal femur ([place]) 07/08/2014  . Osteoporosis 07/08/2014  . Femur fracture, left ([place]) 07/08/2014  . Colles' fracture of left radius   . Left hip pain 05/03/2014  . Perimenopausal symptoms 02/13/2012  . Hyponatremia 12/29/2011  . Asthma exacerbation attacks 12/28/2011  . Bronchitis 12/28/2011  . Repeated falls 07/30/2011  . Hip fracture, left ([place]) 07/16/2011  . Femoral neck fracture ([place]) 07/16/2011  . Tobacco abuse 04/14/2010  . Dyslipidemia 12/23/2008  . COPD with acute bronchitis ([place]) 11/29/2008  . THRUSH 05/20/2008  . Human immunodeficiency virus (HIV) disease ([place]) 04/19/2008  . Diabetes mellitus type 2 with complications, uncontrolled ([place]) 04/19/2008  . Mood disorder ([place]) 04/19/2008  . Hereditary and idiopathic peripheral neuropathy 04/19/2008  . Essential hypertension 04/19/2008    Past Surgical History:  Procedure Laterality Date  . AMPUTATION Left 11/18/2014   Procedure: LEFT BELOW KNEE AMPUTATION;  Surgeon: Leandrew Koyanagi, MD;  Location: [facility];  Service: Orthopedics;  Laterality: Left;  . CAST APPLICATION Left 06/08/1243   Procedure: CAST APPLICATION (FIBERGLASS);  Surgeon: Latanya Maudlin, MD;  Location: [facility];  Service: Orthopedics;  Laterality: Left;  CLOSED REDUCTION OF LEFT COLLES FRACTURE WITH SHORT ARM CAST  . HARDWARE REMOVAL  Left 05/05/2014   Procedure: HARDWARE REMOVAL LEFT HIP;  Surgeon: Latanya Maudlin, MD;  Location: [facility];  Service: Orthopedics;  Laterality: Left;  REMOVAL BIOMET 6.5-8.0 CANNULATED SCREW  . HIP ARTHROPLASTY Left 05/05/2014   Procedure: ARTHROPLASTY OPEN REDUCTION INTERNAL FIXATION LEFT HIP AND REMOVAL OF TWO CANNULATED SCREW;  Surgeon: Latanya Maudlin, MD;  Location: [facility];  Service: Orthopedics;  Laterality: Left;  . HIP PINNING,CANNULATED  07/16/2011   Procedure: CANNULATED HIP PINNING;  Surgeon: Gearlean Alf, MD;  Location: [facility];  Service: Orthopedics;  Laterality: Left;  . HIP PINNING,CANNULATED Left 04/09/2012   Procedure: CANNULATED HIP PINNING AND HARDWARE REVISION;  Surgeon: Gearlean Alf, MD;  Location: [facility];  Service: Orthopedics;  Laterality: Left;  . I&D EXTREMITY Left 11/16/2014   Procedure:  DEBRIDEMENT OF LEFT FOOT POSSIBLE APPLICATION OF INTEGRIA AND VAC ;  Surgeon: Irene Limbo, MD;  Location: [facility];  Service: Clinical cytogeneticist;  Laterality: Left;  . PERIPHERAL VASCULAR CATHETERIZATION N/A 11/15/2014   Procedure: Abdominal Aortogram;  Surgeon: Conrad Quinlan, MD;  Location: [facility];  Service: Cardiovascular;  Laterality: N/A;  . PERIPHERAL VASCULAR CATHETERIZATION  11/15/2014   Procedure: Lower Extremity Angiography;  Surgeon: Conrad Hitchcock, MD;  Location: [facility];  Service: Cardiovascular;;  . PERIPHERAL VASCULAR CATHETERIZATION Left 11/15/2014   Procedure: Peripheral Vascular Intervention;  Surgeon: Conrad Aquilla, MD;  Location: [facility];  Service: Cardiovascular;  Laterality: Left;  popliteal artery stenting     OB History    Gravida  2   Para  1   Term  1   Preterm  0   AB  1   Living  1     SAB  1   TAB      Ectopic  0   Multiple  0   Live Births               Home Medications    Prior to Admission medications   Medication Sig Start Date End Date Taking? Authorizing Provider  albuterol (PROVENTIL HFA;VENTOLIN HFA) 108  (90 Base) MCG/ACT inhaler INHALE 2 PUFFS INTO THE LUNGS EVERY 6 HOURS AS NEEDED FOR WHEEZING OR SHORTNESS OF BREATH Patient taking differently: Inhale 2 puffs into the lungs every 6 (six) hours as needed for wheezing or shortness of breath.  03/08/17  Yes Chesley Mires, MD  ARIPiprazole (ABILIFY) 15 MG tablet Take 15 mg by mouth daily.   Yes [provider]  BREO ELLIPTA 100-25 MCG/INH AEPB INHALE 1 PUFF INTO THE LUNGS DAILY Patient taking differently: Inhale 1 puff into the lungs daily.  05/29/17  Yes Chesley Mires, MD  clonazePAM (KLONOPIN) 0.5 MG tablet Take 0.5 mg by mouth 3 (three) times daily as needed for anxiety.   Yes [provider]  diphenoxylate-atropine (LOMOTIL) 2.5-0.025 MG tablet Take 1 tablet by mouth 4 (four) times daily as needed for diarrhea or loose stools. 11/20/16  Yes Campbell Riches, MD  doxepin (SINEQUAN) 10 MG capsule Take 10 mg by mouth at bedtime.  02/13/17  Yes [provider]  elvitegravir-cobicistat-emtricitabine-tenofovir (GENVOYA) 150-150-200-10 MG TABS tablet Take 1 tablet by mouth daily. 05/23/17  Yes Campbell Riches, MD  fenofibrate (TRICOR) 145 MG tablet Take 145 mg by mouth daily.   Yes [provider]  furosemide (LASIX) 80 MG tablet Take 80 mg by mouth daily. 02/10/17  Yes [provider]  HYDROcodone-acetaminophen (NORCO) 10-325 MG tablet Take 1 tablet by mouth every 8 (eight) hours as needed (pain).   Yes [provider]  Insulin Glargine (TOUJEO MAX SOLOSTAR) 300 UNIT/ML SOPN Inject 60 Units into the skin 2 (two) times daily. 05/29/17  Yes Elayne Snare, MD  insulin regular human CONCENTRATED (HUMULIN R U-500 KWIKPEN) 500 UNIT/ML kwikpen INJECT 60 UNITS BEFORE BREAKFAST, 50 UNITS BEFORE LUNCH, AND 35 UNITS BEFORE DINNER. Patient taking differently: Inject 35-60 Units into the skin See admin instructions. INJECT 60 UNITS BEFORE BREAKFAST, 50 UNITS BEFORE LUNCH, AND 35 UNITS BEFORE DINNER. 05/29/17  Yes Elayne Snare, MD  liraglutide (VICTOZA) 18 MG/3ML SOPN Inject 0.3 mLs (1.8 mg total) into the skin daily. 05/29/17  Yes Elayne Snare, MD  MAGNESIUM-OXIDE 400 (241.3 Mg) MG tablet Take 400 mg by mouth daily. 02/15/17  Yes [provider]  NARCAN 4 MG/0.1ML LIQD nasal spray kit Place 1 spray into the nose as needed. overdose 02/01/17  Yes [provider]  OXYGEN Inhale 3 L into the lungs continuous.   Yes [provider]  pantoprazole (PROTONIX) 40 MG tablet Take 40 mg by mouth daily. 09/02/14  Yes [provider]  potassium chloride SA (K-DUR,KLOR-CON) 20 MEQ tablet Take 1 tablet (20 mEq total) by mouth daily. X 3 days, then check potassium levels if needed to continue Patient not taking: Reported on 08/26/2017 05/25/16  Yes Rai, Ripudeep K, MD  rosuvastatin (CRESTOR) 20 MG tablet Take 20 mg by mouth daily. Reported on 05/02/2015 09/02/14  Yes [provider]  SYMBICORT 80-4.5 MCG/ACT inhaler Inhale 1 puff into the lungs every 12 (twelve) hours as needed (SOB, wheezing).  02/26/17  Yes [provider]  temazepam (RESTORIL) 15 MG capsule Take 30 mg by mouth at bedtime as needed for sleep.  02/13/17  Yes [provider]  tiZANidine (ZANAFLEX) 4 MG tablet Take 4 mg by mouth every 8 (eight) hours.  03/06/16  Yes [provider]  potassium chloride (K-DUR) 10 MEQ tablet Take 1 tablet (10 mEq total) by mouth daily. Patient not taking: Reported on 08/26/2017 02/26/17   Recardo Evangelist, PA-C    Family History Family History  Problem Relation Age of Onset  . Diabetes Mother   . Hypertension Mother   . Vision loss Mother   . Heart disease Mother   . Hypertension Father   . Thyroid disease Neg Hx     Social History Social History   Tobacco Use  . Smoking status: Current Every Day Smoker    Packs/day: 0.50    Years: 35.00    Pack years: 17.50    Types: E-cigarettes, Cigarettes  . Smokeless tobacco: Never Used  Substance Use Topics  . Alcohol  use: No    Alcohol/week: 0.0 standard drinks    Comment: no drink since 2008  . Drug use: No    Comment: hx of crack/cocaine, clean 8 years     Allergies   Cortizone-10 [hydrocortisone]   Review of Systems Review of Systems  All systems reviewed and negative, other than as noted in HPI.  Physical Exam Updated Vital Signs BP (!) 146/77 (BP Location: Right Arm)   Pulse (!) 107   Temp 98.7 F (37.1 C) (Oral)   Resp 20   Ht _0  (1.778 m)   Wt 107.5 kg   LMP 11/16/2010   SpO2 97%   BMI 34.01 kg/m   Physical Exam  Constitutional: She appears well-developed and well-nourished. No distress.  HENT:  Head: Normocephalic and atraumatic.  Eyes: Conjunctivae are normal. Right eye exhibits no discharge. Left eye exhibits no discharge.  Neck: Neck supple.  Cardiovascular: Regular rhythm and normal heart sounds. Exam reveals no gallop and no friction rub.  No murmur heard. Mild tachycardia  Pulmonary/Chest: She has wheezes.  Tachypnea  Abdominal: Soft. She exhibits no distension. There is no tenderness.  Musculoskeletal: She exhibits no edema or tenderness.  Neurological: She is alert.  Skin: Skin is warm and dry.  Psychiatric: She has a normal mood and affect. Her behavior is normal. Thought content normal.  Nursing note and vitals reviewed.    ED Treatments / Results  Labs (all labs ordered are listed, but only abnormal results are displayed) Labs Reviewed  CBC WITH DIFFERENTIAL/PLATELET - Abnormal; Notable for the following components:      Result Value   WBC 14.2 (*)    RDW 19.1 (*)    Neutro Abs 11.6 (*)    All other  components within normal limits    EKG EKG Interpretation  Date/Time:  _0 /25/19 2342           Virgel Manifold, MD 09/03/17 [phone]

## 2017-09-03 NOTE — Progress Notes (Signed)
Physical Therapy Treatment Patient Details Name: [name] MRN: [id number] DOB: 01/16/1965 Today's Date: 09/03/2017    History of Present Illness Pt is a 52 y.o. female admitted 08/29/17 after reportedly having syncopal episode with fall at home and being found down 24-48 hours later. Suspect syncopal episode due to dehydration; pt found to be in AKI and hypernatremic. Mild rhabdomyolysis. Increased confusion while she has been here. CT head and cervical spine clear. PMH includes COPD, DMII, HIV, HF, chronic pain, L BKA.     PT Comments    RN asked for assist from PT to assist pt on BSC to have BM. Placed L prosthesis and pt able to tolerate 2 trials of standing with maxAx2 however unable to maintain > 5 sec. Pt remains sleepy, slow to respond, and requiring assistx2 for all mobility.   Follow Up Recommendations  SNF;Supervision for mobility/OOB     Equipment Recommendations  None recommended by PT    Recommendations for Other Services       Precautions / Restrictions Precautions Precautions: Fall Precaution Comments: L BKA, prosthetic present but needs socks due to very loose fitting Restrictions Weight Bearing Restrictions: No    Mobility  Bed Mobility Overal bed mobility: Needs Assistance Bed Mobility: Rolling Rolling: Max assist   Supine to sit: Max assist;HOB elevated Sit to supine: Max assist   General bed mobility comments: max directional verbal and tactile cues to complete task. maxA for trunk elevation and to control descent into sidelying.   Transfers Overall transfer level: Needs assistance Equipment used: (2 person lift with gait belt and bed pad) Transfers: Stand Pivot Transfers;Sit to/from Stand Sit to Stand: Max assist;+2 physical assistance Stand pivot transfers: Max assist;+2 physical assistance      Lateral/Scoot Transfers: Max assist;+2 physical assistance General transfer comment: donned L Prosthesis, pt able to complete full stand to place  bed pan under pt, then again to remove it. Pt unable to advance LEs to step during std pvt back to bed requiring maxA to advance LEs  Ambulation/Gait             General Gait Details: unable   Stairs             Wheelchair Mobility    Modified Rankin (Stroke Patients Only)       Balance Overall balance assessment: Needs assistance Sitting-balance support: Feet supported;Bilateral upper extremity supported Sitting balance-Leahy Scale: Fair     Standing balance support: Bilateral upper extremity supported Standing balance-Leahy Scale: Poor Standing balance comment: dependent on RW and physical assist                            Cognition Arousal/Alertness: Awake/alert Behavior During Therapy: Flat affect Overall Cognitive Status: No family/caregiver present to determine baseline cognitive functioning Area of Impairment: Following commands;Safety/judgement;Problem solving                   Current Attention Level: Sustained   Following Commands: Follows one step commands with increased time Safety/Judgement: Decreased awareness of safety;Decreased awareness of deficits Awareness: Emergent(pt pooping in chair) Problem Solving: Slow processing;Decreased initiation;Difficulty sequencing;Requires verbal cues;Requires tactile cues General Comments: pt resistant to education on safety, pt also stating she just needs to go home for one day to get better      Exercises      General Comments General comments (skin integrity, edema, etc.): pt dependent for hygiene s/p BM, pt with skin tear on bottom  Pertinent Vitals/Pain Pain Assessment: Faces Faces Pain Scale: Hurts even more Pain Location: pt with generalized pain with movement, doesn't specificallly state location Pain Descriptors / Indicators: Sore;Grimacing Pain Intervention(s): Monitored during session    Home Living                      Prior Function            PT  Goals (current goals can now be found in the care plan section) Acute Rehab PT Goals Patient Stated Goal: didn't state Progress towards PT goals: Progressing toward goals    Frequency    Min 2X/week      PT Plan Current plan remains appropriate    Co-evaluation              AM-PAC PT "6 Clicks" Daily Activity  Outcome Measure  Difficulty turning over in bed (including adjusting bedclothes, sheets and blankets)?: Unable Difficulty moving from lying on back to sitting on the side of the bed? : Unable Difficulty sitting down on and standing up from a chair with arms (e.g., wheelchair, bedside commode, etc,.)?: Unable Help needed moving to and from a bed to chair (including a wheelchair)?: Total Help needed walking in hospital room?: Total Help needed climbing 3-5 steps with a railing? : Total 6 Click Score: 6    End of Session Equipment Utilized During Treatment: Oxygen Activity Tolerance: Patient limited by fatigue;Patient limited by lethargy Patient left: in bed;with call bell/phone within reach;with nursing/sitter in room Nurse Communication: Mobility status PT Visit Diagnosis: Other abnormalities of gait and mobility (R26.89);Repeated falls (R29.6)     Time: 1130-1155 PT Time Calculation (min) (ACUTE ONLY): 25 min  Charges:  $Therapeutic Activity: 23-37 mins                     Kittie Plater, PT, DPT Acute Rehabilitation Services Pager #: [phone] Office #: [phone]    Berline Lopes 09/03/2017, 2:15 PM

## 2017-09-03 NOTE — Progress Notes (Signed)
PROGRESS NOTE    [name]  KGU:[id number] DOB: Apr 01, 1965 DOA: 08/29/2017 PCP: [name], PA-C    Brief Narrative:  [name] is a 52 y.o. female with medical history significant of COPD, type 2 diabetes mellitus, HIV, diastolic heart failure, dyslipidemia and chronic pain syndrome.  Patient was discharged from the hospital August 27 after being treated for COPD exacerbation.  Apparently she lives at home by herself.  She reported experiencing a syncope episode in her bedroom, unclear about prodromal symptoms, but apparently after recovering her consciousness she was not able to stand.  She had a left BKA and uses a prosthesis.  At the time of her fall she was not using the prosthesis.  She spent about 24 to 48 hours on the floor, until she was found by her daughter-in-law who brought her to the hospital.    Assessment & Plan:   Active Problems:   Human immunodeficiency virus (HIV) disease ([place])   Diabetes mellitus type 2 with complications, uncontrolled ([place])   Mood disorder ([place])   Essential hypertension   COPD with acute bronchitis ([place])   Tobacco abuse   Repeated falls   Diabetic neuropathy ([place])   Obesity hypoventilation syndrome ([place])   Hyperlipidemia   CKD (chronic kidney disease), stage III ([name])   Syncope   Syncope: Differential include fall from deconditioning from BKA. vs vaso vagal vs arrhythmias vs dehydration. Suspect probably from dehydration as she was found to be in AKI and hypernatremic.  CT head and cervical spine without any acute findings.  Orthostatic vital signs couldn't calculated due to her BKA.  EKG on admission shows atrial fibrillation with rate in 90/min, ? Paroxysmal afib. Repeat ekg ordered showed atrial flutter, cardiology consulted, suggested it was artifact. She continues to have tremors.  TSH WNL.  Echocardiogram reviewed.  No new recommendations. PT worked with her recommended SNF, initially she refused but now she is  agreeable.  Social worker on board.     Mild COPD exacerbation:  She is restarted on IV solumedrol and duo nebs every 6 hours and oral azithromycin for 3 days. Completed the course. Pt was coughing while eating, SLP evaluation recommending dysphagia 3  Diet. Wheezing has improved, steroid discontinued.  [place] oxygen to keep sats greater than 90%. Pt at home reports being on 3lit of [place] oxygen.    Mild rhabdomyolysis Possibly from dehydration. Improved.    Hypernatremia: resolved with hydration.   AKI on stage 3 CKD:  Possibly pre renal from dehydration.  Improving with IV fluids.  No signs of infection found on UA.    Hypokalemia Replaced. Repeat BMP today.    Elevated liver function tests Possibly from mild rhabdomyolysis. She denies any nausea or vomiting.    Leukocytosis from steroid use.    Chronic diastolic heart failure Appears to be compensated.    Uncontrolled Diabetes mellitus; with hyperglycemia CBG (last 3)  Recent Labs    09/02/17 1744 09/02/17 2059 09/03/17 0752  GLUCAP 190* 158* 207*   not well controlled. Restarted her on home meds.  Pt is not on prednisone at this time.     HIV: Resume home dose of gENVOYA.    Delirium/ acute encephalopathy: Probably from alcohol use. Though she denied it on admission. Decreased the dose of clonazepam to 0/25 mg TID PRN.  Started her on CIWA. Requiring frequent orientation to the surroundings. Initial CT head without contrast negative for acute stroke.  MRI brain without contrast ordered for evaluation of stroke.  Tobacco abuse;  Counseled,  Added nicotine patch.        DVT prophylaxis: heparin sq Code Status: FULL CODE.  Family Communication: discussed with daughter in law over the phone on 9/3 Disposition Plan: pending MRI and SNF placement when bed available.   Consultants:   Cardiology.    Procedures: echocardiogram    Antimicrobials: completed azithromycin. .     Subjective: Tremors have improved.  She is alert and oriented to person .  She denies any chest pain.  Objective: Vitals:   09/03/17 0600 09/03/17 0749 09/03/17 0756 09/03/17 1212  BP:   115/80 (!) 164/102  Pulse:   84 62  Resp:   20 20  Temp:   (!) 97.5 F (36.4 C) (!) 96.8 F (36 C)  TempSrc:    Axillary  SpO2:  97%    Weight: 100.8 kg     Height:        Intake/Output Summary (Last 24 hours) at 09/03/2017 1327 Last data filed at 09/03/2017 1044 Gross per 24 hour  Intake 249 ml  Output 1000 ml  Net -751 ml   Filed Weights   08/29/17 1956 09/03/17 0600  Weight: 106.7 kg 100.8 kg    Examination:  General exam: comfortable on 3l it of [place] oxygen.  Respiratory system: air entry fair, no wheezing or rhonchi.  Cardiovascular system: S1 & S2 heard, RRR. No JVD, murmurs Gastrointestinal system: Abdomen is soft NT nd bs+ Central nervous system: able to answer some simple questions. Able to move all extremities.   Extremities: left BKA,  no pedal edema.  Skin: No rashes, lesions or ulcers     Data Reviewed: I have personally reviewed following labs and imaging studies  CBC: Recent Labs  Lab 08/29/17 1222 08/30/17 0558 09/01/17 0232 09/02/17 1254  WBC 15.4* 14.1* 22.4* 22.5*  NEUTROABS 13.4*  --   --   --   HGB 16.2* 12.6 12.6 13.8  HCT 51.8* 39.2 38.9 42.7  MCV 90.2 88.7 87.8 87.3  PLT 306 221 259 381   Basic Metabolic Panel: Recent Labs  Lab 08/29/17 1222 08/30/17 0558 08/30/17 1745 08/31/17 0237 09/01/17 0232 09/02/17 1254  NA 150* 136  --  136 140 141  K 4.7 3.3*  --  4.9 4.1 3.2*  CL 106 100  --  101 104 96*  CO2 22 24  --  21* 24 33*  GLUCOSE 109* 156* 402* 430* 292* 434*  BUN 83* 66*  --  42* 29* 29*  CREATININE 2.04* 1.67*  --  1.45* 1.11* 1.38*  CALCIUM 9.6 8.2*  --  8.6* 8.9 8.9   GFR: Estimated Creatinine Clearance: 53.4 mL/min (A) (by C-G formula based on SCr of 1.38 mg/dL (H)). Liver Function Tests: Recent Labs  Lab 08/29/17 1222   AST 75*  ALT 63*  ALKPHOS 86  BILITOT 2.0*  PROT 6.6  ALBUMIN 3.2*   No results for input(s): LIPASE, AMYLASE in the last 168 hours. No results for input(s): AMMONIA in the last 168 hours. Coagulation Profile: No results for input(s): INR, PROTIME in the last 168 hours. Cardiac Enzymes: Recent Labs  Lab 08/29/17 1222 08/30/17 0558 08/30/17 0958 08/30/17 1430 08/30/17 2119  CKTOTAL 788* 235*  --   --   --   TROPONINI  --   --  0.04* 0.04* <0.03   BNP (last 3 results) No results for input(s): PROBNP in the last 8760 hours. HbA1C: No results for input(s): HGBA1C in the last 72 hours.  CBG: Recent Labs  Lab 09/02/17 0818 09/02/17 1148 09/02/17 1744 09/02/17 2059 09/03/17 0752  GLUCAP 346* 433* 190* 158* 207*   Lipid Profile: No results for input(s): CHOL, HDL, LDLCALC, TRIG, CHOLHDL, LDLDIRECT in the last 72 hours. Thyroid Function Tests: No results for input(s): TSH, T4TOTAL, FREET4, T3FREE, THYROIDAB in the last 72 hours. Anemia Panel: No results for input(s): VITAMINB12, FOLATE, FERRITIN, TIBC, IRON, RETICCTPCT in the last 72 hours. Sepsis Labs: Recent Labs  Lab 08/29/17 1233  LATICACIDVEN 1.60    Recent Results (from the past 240 hour(s))  MRSA PCR Screening     Status: None   Collection Time: 08/29/17  6:24 PM  Result Value Ref Range Status   MRSA by PCR NEGATIVE NEGATIVE Final    Comment:        The GeneXpert MRSA Assay (FDA approved for NASAL specimens only), is one component of a comprehensive MRSA colonization surveillance program. It is not intended to diagnose MRSA infection nor to guide or monitor treatment for MRSA infections. Performed at [facility], [place] [address] [place], [place] [address]          Radiology Studies: No results found.      Scheduled Meds: . ARIPiprazole  15 mg Oral Daily  . doxepin  10 mg Oral QHS  . elvitegravir-cobicistat-emtricitabine-tenofovir  1 tablet Oral Q breakfast  . fenofibrate  160 mg  Oral Daily  . fluticasone furoate-vilanterol  1 puff Inhalation Daily  . furosemide  80 mg Oral Daily  . heparin  5,000 Units Subcutaneous Q8H  . insulin aspart  0-20 Units Subcutaneous TID WC  . insulin aspart  0-5 Units Subcutaneous QHS  . insulin glargine  60 Units Subcutaneous BID  . insulin regular human CONCENTRATED  35-60 Units Subcutaneous See admin instructions  . magnesium oxide  400 mg Oral Daily  . multivitamin with minerals  1 tablet Oral Daily  . nicotine  21 mg Transdermal Daily  . pantoprazole  40 mg Oral Daily  . polyethylene glycol  17 g Oral Daily  . rosuvastatin  20 mg Oral Daily  . senna-docusate  2 tablet Oral BID  . sodium chloride flush  3 mL Intravenous Q12H   Continuous Infusions: . sodium chloride 10 mL/hr at 08/31/17 1600     LOS: 5 days    Time spent: 35 minutes.     Hosie Poisson, MD [facility] Pager [phone]   If 7PM-7AM, please contact night-coverage [email or web address] Password [email or web address] 09/03/2017, 1:27 PM

## 2017-09-03 NOTE — Progress Notes (Signed)
Physical Therapy Treatment Patient Details Name: [name] MRN: [id number] DOB: 1965-05-16 Today's Date: 09/03/2017    History of Present Illness Pt is a 52 y.o. female admitted 08/29/17 after reportedly having syncopal episode with fall at home and being found down 24-48 hours later. Suspect syncopal episode due to dehydration; pt found to be in AKI and hypernatremic. Mild rhabdomyolysis. Increased confusion while she has been here. CT head and cervical spine clear. PMH includes COPD, DMII, HIV, HF, chronic pain, L BKA.     PT Comments    Pt cont to be very lethargic with minimal contribution to mobility. Pt currently maxAx2 for all mobility. Pt unable to return safely to home at this time and would benefit from ST-SNF to achieve safe mod I mobility.   Follow Up Recommendations  SNF;Supervision for mobility/OOB     Equipment Recommendations  None recommended by PT    Recommendations for Other Services       Precautions / Restrictions Precautions Precautions: Fall Precaution Comments: L BKA, prosthetic present but needs socks due to very loose fitting Restrictions Weight Bearing Restrictions: No    Mobility  Bed Mobility Overal bed mobility: Needs Assistance Bed Mobility: Rolling Rolling: Max assist   Supine to sit: Max assist;HOB elevated Sit to supine: Max assist   General bed mobility comments: max directional verbal and tactile cues to complete task. maxA for trunk elevation and to control descent into sidelying.   Transfers Overall transfer level: Needs assistance Equipment used: (2 person lift with bed pad ) Transfers: Lateral/Scoot Transfers          Lateral/Scoot Transfers: Max assist;+2 physical assistance General transfer comment: unable to achieve standing due to not having shrinker to place into prosthesis. maxAx2 to scoot to the L into chair, pt initiated transfer but didn't contribute to transfer  Ambulation/Gait             General Gait  Details: unable   Stairs             Wheelchair Mobility    Modified Rankin (Stroke Patients Only)       Balance Overall balance assessment: Needs assistance Sitting-balance support: Feet supported;Bilateral upper extremity supported Sitting balance-Leahy Scale: Fair         Standing balance comment: unable to achieve                            Cognition Arousal/Alertness: Lethargic Behavior During Therapy: Flat affect Overall Cognitive Status: No family/caregiver present to determine baseline cognitive functioning Area of Impairment: Following commands;Safety/judgement;Problem solving                   Current Attention Level: Sustained   Following Commands: Follows one step commands with increased time Safety/Judgement: Decreased awareness of safety;Decreased awareness of deficits Awareness: Emergent("i have to poop") Problem Solving: Slow processing;Decreased initiation;Difficulty sequencing;Requires verbal cues;Requires tactile cues(extremely delayed processing) General Comments: pt resistant to education on safety, pt also stating she just needs to go home for one day to get better      Exercises      General Comments General comments (skin integrity, edema, etc.): pt with multiple skin lesion and area of breakdown and skin tears      Pertinent Vitals/Pain Pain Assessment: Faces Faces Pain Scale: Hurts even more Pain Location: pt with generalized pain with movement, doesn't specificallly state location Pain Descriptors / Indicators: Sore;Grimacing Pain Intervention(s): Monitored during session  Home Living                      Prior Function            PT Goals (current goals can now be found in the care plan section) Acute Rehab PT Goals Patient Stated Goal: didn't state Progress towards PT goals: Progressing toward goals    Frequency    Min 2X/week      PT Plan Current plan remains appropriate     Co-evaluation              AM-PAC PT "6 Clicks" Daily Activity  Outcome Measure  Difficulty turning over in bed (including adjusting bedclothes, sheets and blankets)?: Unable Difficulty moving from lying on back to sitting on the side of the bed? : Unable Difficulty sitting down on and standing up from a chair with arms (e.g., wheelchair, bedside commode, etc,.)?: Unable Help needed moving to and from a bed to chair (including a wheelchair)?: Total Help needed walking in hospital room?: Total Help needed climbing 3-5 steps with a railing? : Total 6 Click Score: 6    End of Session   Activity Tolerance: Patient limited by fatigue;Patient limited by lethargy Patient left: in chair;with call bell/phone within reach;with nursing/sitter in room Nurse Communication: Mobility status PT Visit Diagnosis: Other abnormalities of gait and mobility (R26.89);Repeated falls (R29.6)     Time: 6226-3335 PT Time Calculation (min) (ACUTE ONLY): 23 min  Charges:  $Therapeutic Activity: 23-37 mins                     Kittie Plater, PT, DPT Acute Rehabilitation Services Pager #: [phone] Office #: [phone]    [name] 09/03/2017, 2:10 PM

## 2017-09-03 NOTE — [place] FL2 (Signed)
MEDICAID FL2 LEVEL OF CARE SCREENING TOOL     IDENTIFICATION  Patient Name: [name] Birthdate: September 01, 1965 Sex: female Admission Date (Current Location): 08/29/2017  [facility] and [place] Number:  Herbalist and Address:  [facility] [place]. [facility] [place] [address] [place], [place] [address]      Provider Number: [id number]  Attending Physician Name and Address:  Hosie Poisson, MD  Relative Name and Phone Number:       Current Level of Care: Hospital Recommended Level of Care: [place] Prior Approval Number:    Date Approved/Denied:   PASRR Number: [id number]  Discharge Plan: SNF    Current Diagnoses: Patient Active Problem List   Diagnosis Date Noted  . Syncope 08/29/2017  . DKA (diabetic ketoacidoses) ([place]) 03/02/2017  . COPD exacerbation ([place]) 02/23/2017  . Acute on chronic respiratory failure with hypoxia and hypercapnia ([place]) 08/19/2016  . Leukocytosis 08/19/2016  . CKD (chronic kidney disease), stage III ([place]) 08/19/2016  . Altered mental status 08/19/2016  . Asthma exacerbation 06/18/2016  . Insomnia 06/12/2016  . Difficult intravenous access   . Acute on chronic respiratory failure with hypoxia ([place]) 05/19/2016  . NSTEMI (non-ST elevated myocardial infarction) ([place]) 05/19/2016  . Acute metabolic encephalopathy 94/49/6759  . Hyperlipidemia   . Class 3 obesity due to excess calories with serious comorbidity and body mass index (BMI) of 40.0 to 44.9 in adult   . Obesity hypoventilation syndrome ([place])   . Diastolic dysfunction 16/38/4665  . Diabetic neuropathy ([place]) 11/10/2014  . Fracture of distal femur ([place]) 07/08/2014  . Osteoporosis 07/08/2014  . Femur fracture, left ([place]) 07/08/2014  . Colles' fracture of left radius   . Left hip pain 05/03/2014  . Perimenopausal symptoms 02/13/2012  . Hyponatremia 12/29/2011  . Asthma exacerbation attacks 12/28/2011  . Bronchitis 12/28/2011  . Repeated falls  07/30/2011  . Hip fracture, left ([place]) 07/16/2011  . Femoral neck fracture ([place]) 07/16/2011  . Tobacco abuse 04/14/2010  . Dyslipidemia 12/23/2008  . COPD with acute bronchitis ([place]) 11/29/2008  . THRUSH 05/20/2008  . Human immunodeficiency virus (HIV) disease ([place]) 04/19/2008  . Diabetes mellitus type 2 with complications, uncontrolled ([place]) 04/19/2008  . Mood disorder ([place]) 04/19/2008  . Hereditary and idiopathic peripheral neuropathy 04/19/2008  . Essential hypertension 04/19/2008    Orientation RESPIRATION BLADDER Height & Weight     Self, Place  O2(Nasal Canula 3 L) Continent, Indwelling catheter Weight: 222 lb 3.6 oz (100.8 kg) Height:  5\' 3"  (160 cm)  BEHAVIORAL SYMPTOMS/MOOD NEUROLOGICAL BOWEL NUTRITION STATUS  Other (Comment)(Anxious, cooperative.) (None) Continent Diet(DYS 3. Fluid nectar thick.)  AMBULATORY STATUS COMMUNICATION OF NEEDS Skin   Extensive Assist Verbally Bruising, Other (Comment)(Excoriated, MASD, Skin tear.)                       Personal Care Assistance Level of Assistance  Bathing, Feeding, Dressing Bathing Assistance: Maximum assistance Feeding assistance: (Supervision) Dressing Assistance: Maximum assistance     Functional Limitations Info  Sight, Hearing, Speech Sight Info: Adequate Hearing Info: Adequate Speech Info: Adequate    SPECIAL CARE FACTORS FREQUENCY  PT (By licensed PT), Blood pressure, OT (By licensed OT), Speech therapy     PT Frequency: 5 x week OT Frequency: 5 x week     Speech Therapy Frequency: 5 x week      Contractures Contractures Info: Not present    Additional Factors Info  Code Status, Allergies, Psychotropic Code Status Info:  Full Allergies Info: Cortizone-10 (Hydrocortizone) Psychotropic Info: Mood disorder: Abilify 15 mg PO daily, Klonopin 0.25 mg PO TID prn.         Current Medications (09/03/2017):  This is the current hospital active medication list Current Facility-Administered Medications   Medication Dose Route Frequency Provider Last Rate Last Dose  . 0.9 %  sodium chloride infusion  250 mL Intravenous PRN Arrien, Jimmy [place]et, MD 10 mL/hr at 08/31/17 1600    . acetaminophen (TYLENOL) tablet 650 mg  650 mg Oral Q6H PRN Arrien, Jimmy Picket, MD   650 mg at 08/29/17 2142   Or  . acetaminophen (TYLENOL) suppository 650 mg  650 mg Rectal Q6H PRN Arrien, Jimmy Picket, MD      . ARIPiprazole (ABILIFY) tablet 15 mg  15 mg Oral Daily Arrien, Jimmy Picket, MD   15 mg at 09/03/17 1041  . clonazepam (KLONOPIN) disintegrating tablet 0.25 mg  0.25 mg Oral TID PRN Hosie Poisson, MD   0.25 mg at 09/03/17 0809  . doxepin (SINEQUAN) capsule 10 mg  10 mg Oral QHS Arrien, Jimmy Picket, MD   10 mg at 09/02/17 2149  . elvitegravir-cobicistat-emtricitabine-tenofovir (GENVOYA) 150-150-200-10 MG tablet 1 tablet  1 tablet Oral Q breakfast Arrien, Jimmy Picket, MD   1 tablet at 09/03/17 0809  . fenofibrate tablet 160 mg  160 mg Oral Daily Arrien, Jimmy Picket, MD   160 mg at 09/03/17 1041  . fluticasone furoate-vilanterol (BREO ELLIPTA) 100-25 MCG/INH 1 puff  1 puff Inhalation Daily Hosie Poisson, MD   1 puff at 09/03/17 0748  . furosemide (LASIX) tablet 80 mg  80 mg Oral Daily Hosie Poisson, MD   80 mg at 09/03/17 1041  . guaiFENesin-dextromethorphan (ROBITUSSIN DM) 100-10 MG/5ML syrup 5 mL  5 mL Oral Q4H PRN Hosie Poisson, MD   5 mL at 08/31/17 1607  . heparin injection 5,000 Units  5,000 Units Subcutaneous Q8H Arrien, Jimmy Picket, MD   5,000 Units at 09/03/17 1432  . insulin aspart (novoLOG) injection 0-20 Units  0-20 Units Subcutaneous TID WC Hosie Poisson, MD   7 Units at 09/03/17 1253  . insulin aspart (novoLOG) injection 0-5 Units  0-5 Units Subcutaneous QHS Hosie Poisson, MD   5 Units at 09/01/17 2247  . insulin glargine (LANTUS) injection 60 Units  60 Units Subcutaneous BID Hosie Poisson, MD   60 Units at 09/03/17 1042  . insulin regular human CONCENTRATED (HUMULIN R) 500  UNIT/ML kwikpen 35-60 Units  35-60 Units Subcutaneous See admin instructions Hosie Poisson, MD      . ipratropium-albuterol (DUONEB) 0.5-2.5 (3) MG/3ML nebulizer solution 3 mL  3 mL Nebulization Q6H PRN Hosie Poisson, MD      . magnesium oxide (MAG-OX) tablet 400 mg  400 mg Oral Daily Arrien, Jimmy Picket, MD   400 mg at 09/03/17 1042  . morphine 2 MG/ML injection 1 mg  1 mg Intravenous Q2H PRN Arrien, Jimmy Picket, MD   1 mg at 09/01/17 0225  . multivitamin with minerals tablet 1 tablet  1 tablet Oral Daily Arrien, Jimmy Picket, MD   1 tablet at 09/03/17 1041  . nicotine (NICODERM CQ - dosed in mg/24 hours) patch 21 mg  21 mg Transdermal Daily Hosie Poisson, MD   21 mg at 09/02/17 1028  . ondansetron (ZOFRAN) tablet 4 mg  4 mg Oral Q6H PRN Arrien, Jimmy Picket, MD       Or  . ondansetron [facility]) injection 4 mg  4 mg Intravenous Q6H PRN Arrien,  Jimmy Picket, MD   4 mg at 08/31/17 2239  . pantoprazole (PROTONIX) EC tablet 40 mg  40 mg Oral Daily Arrien, Jimmy Picket, MD   40 mg at 09/03/17 1042  . polyethylene glycol (MIRALAX / GLYCOLAX) packet 17 g  17 g Oral Daily Hosie Poisson, MD   17 g at 09/03/17 1042  . [place]   Oral PRN Hosie Poisson, MD      . rosuvastatin (CRESTOR) tablet 20 mg  20 mg Oral Daily Hosie Poisson, MD   20 mg at 09/03/17 1041  . senna-docusate (Senokot-S) tablet 2 tablet  2 tablet Oral BID Hosie Poisson, MD   2 tablet at 09/03/17 1041  . sodium chloride flush (NS) 0.9 % injection 3 mL  3 mL Intravenous Q12H Arrien, Jimmy Picket, MD   3 mL at 09/03/17 1044  . sodium chloride flush (NS) 0.9 % injection 3 mL  3 mL Intravenous PRN Arrien, Jimmy Picket, MD      . temazepam (RESTORIL) capsule 15 mg  15 mg Oral QHS PRN Hosie Poisson, MD   15 mg at 09/02/17 2155     Discharge Medications: Please see discharge summary for a list of discharge medications.  Relevant Imaging Results:  Relevant Lab Results:   Additional Information SS#:  [id number]. Has a telesitter due to new confusion and attempting to get out of bed. Will discontinue at least 24 hours prior to discharge.  Candie Chroman, LCSW

## 2017-09-03 NOTE — Clinical Social Work Note (Signed)
Clinical Social Work Assessment  Patient Details  Name: [name] MRN: [id number] Date of Birth: [birth year]  Date of referral:  09/03/17               Reason for consult:  Facility Placement, Discharge Planning                Permission sought to share information with:  Facility Sport and exercise psychologist, Family Supports Permission granted to share information::  Yes, Verbal Permission Granted  Name::     Lanell Persons  Agency::  SNF's  Relationship::  Daughter-in-law  Contact Information:  [phone]  Housing/Transportation Living arrangements for the past 2 months:  [place] of Information:  Medical Team, Adult Children Patient Interpreter Needed:  None Criminal Activity/Legal Involvement Pertinent to Current Situation/Hospitalization:  No - Comment as needed Significant Relationships:  Adult Children Lives with:  Self Do you feel safe going back to the place where you live?  Yes Need for family participation in patient care:  Yes (Comment)  Care giving concerns:  PT recommending SNF once medically stable for discharge.   Social Worker assessment / plan:  Patient not fully oriented. No supports at bedside. CSW received call back from patient's daughter-in-law. Her husband had given her message from earlier today. CSW introduced role and explained that PT recommendations would be discussed. Patient's daughter-in-law is agreeable to SNF placement. Patient has been to [facility] in the past. She stated patient's confusion is new and if it does not resolve, they may need to look into long-term care. Patient has no other immediate family and son and daughter-in-law do not live close by. Patient has Medicaid which will cover long-term care. CSW asked daughter-in-law about missing "sock" for prosthetic. She stated she did not have it when she found her at home but there is now a cousin in the house that will not leave. Based on conversation it did not appear that  patient's daughter-in-law felt safe going to the patient's home at this time. She stated that if patient needs it, a new one will have to be ordered. Discussed with RN. She will see if this is possible. Patient currently has a Corporate investment banker. This will have to be discontinued at least 24 hours prior to discharge to SNF. No further concerns. CSW encouraged patient's daughter-in-law to contact CSW as needed. CSW will continue to follow patient and her family for support and facilitate discharge to SNF once medically stable.  Employment status:  Disabled (Comment on whether or not currently receiving Disability) Insurance information:  Programmer, applications, Medicaid In [place] PT Recommendations:  [place] / Referral to community resources:  [place]  Patient/Family's Response to care:  Patient not fully oriented. Patient's daughter-in-law agreeable to SNF. Patient's son and daughter-in-law supportive and involved in patient's care. Patient's daughter-in-law appreciated social work intervention.  Patient/Family's Understanding of and Emotional Response to Diagnosis, Current Treatment, and Prognosis:  Patient not fully oriented. Patient's daughter-in-law has a good understanding of the reason for admission and her need for rehab prior to returning home. Patient's daughter-in-law appears happy with hospital care.  Emotional Assessment Appearance:  Appears stated age Attitude/Demeanor/Rapport:  Unable to Assess Affect (typically observed):  Unable to Assess Orientation:  Oriented to Self, Oriented to Place Alcohol / Substance use:  Tobacco Use Psych involvement (Current and /or in the community):  No (Comment)(They tried to see her for capacity but patient is not fully oriented for evaluation.)  Discharge Needs  Concerns to  be addressed:  Care Coordination Readmission within the last 30 days:  Yes Current discharge risk:  Cognitively Impaired, Dependent with  Mobility, Lives alone Barriers to Discharge:  Continued Medical Work up, Ship broker, Requiring sitter/restraints   Candie Chroman, LCSW 09/03/2017, 2:45 PM

## 2017-09-03 NOTE — Progress Notes (Signed)
Modified Barium Swallow Progress Note  Patient Details  Name: [name] MRN: [id number] Date of Birth: [birth year]  Today's Date: 09/03/2017  Modified Barium Swallow completed.  Full report located under Chart Review in the Imaging Section.  Brief recommendations include the following:  Clinical Impression  Pharyngeal swallow was initiated at the pyriform sinuses consistently across liquid consistencies with intermittent penetration. Laryngeal closure incomplete allowing penetration of thin barium via straw onto pt's vocal cords without awareness with cued coughed mobilizing barium higher in vestibule but not clearing. Decreasing volume and velocity of bolus with straw removal was not effective and pt presently not reliable for assessing additional strategies due to awareness/alertness. It should be noted pt with frequent coughing at baseline prior to barium presentations and during study at times with questionable barium in vestibule. Minimal vallecular and pyriform sinus residue cleared with additional swallows. Body habitus prevented esophageal scan. Safest recommendations are continuation of nectar thick liquids, straws allowed, Dys 3 texture, pills whole in puree and volitional cough with continued ST for intervention and appropriateness for upgrade.    Swallow Evaluation Recommendations       SLP Diet Recommendations: Dysphagia 3 (Mech soft) solids;Nectar thick liquid   Liquid Administration via: Cup;Straw   Medication Administration: Whole meds with puree   Supervision: Patient able to self feed;Full supervision/cueing for compensatory strategies   Compensations: Slow rate;Small sips/bites;Clear throat intermittently   Postural Changes: Seated upright at 90 degrees   Oral Care Recommendations: Oral care BID        Houston Siren 09/03/2017,2:55 PM   Orbie Pyo [place] [phone]

## 2017-09-03 NOTE — Clinical Social Work Note (Signed)
Per RN, patient only oriented to self and place. CSW left voicemail for patient's son. Will discuss SNF placement when he calls back and notify him that per RN, patient is missing blanket piece to prosthetic. If no call back by this afternoon, will try again and contact daughter-in-law.  Dayton Scrape, [place]

## 2017-09-03 NOTE — Clinical Social Work Placement (Signed)
   CLINICAL SOCIAL WORK PLACEMENT  NOTE  Date:  09/03/2017  Patient Details  Name: [name] MRN: [id number] Date of Birth: 1965-04-21  Clinical Social Work is seeking post-discharge placement for this patient at the [place] level of care (*CSW will initial, date and re-position this form in  chart as items are completed):      Patient/family provided with [place] Work Department's list of facilities offering this level of care within the geographic area requested by the patient (or if unable, by the patient's family).      Patient/family informed of their freedom to choose among providers that offer the needed level of care, that participate in Medicare, Medicaid or managed care program needed by the patient, have an available bed and are willing to accept the patient.      Patient/family informed of [facility]'s ownership interest in [facility] and [facility], as well as of the fact that they are under no obligation to receive care at these facilities.  PASRR submitted to EDS on 09/03/17     PASRR number received on       Existing PASRR number confirmed on 09/03/17     FL2 transmitted to all facilities in geographic area requested by pt/family on 09/03/17     FL2 transmitted to all facilities within larger geographic area on       Patient informed that his/her managed care company has contracts with or will negotiate with certain facilities, including the following:            Patient/family informed of bed offers received.  Patient chooses bed at       Physician recommends and patient chooses bed at      Patient to be transferred to   on  .  Patient to be transferred to facility by       Patient family notified on   of transfer.  Name of family member notified:        PHYSICIAN Please sign FL2     Additional Comment:    _______________________________________________ Candie Chroman, LCSW 09/03/2017, 2:51  PM

## 2017-09-04 ENCOUNTER — Ambulatory Visit: Payer: Self-pay | Admitting: Pulmonary Disease

## 2017-09-04 DIAGNOSIS — E876 Hypokalemia: Secondary | ICD-10-CM

## 2017-09-04 LAB — GLUCOSE, CAPILLARY
GLUCOSE-CAPILLARY: 92 mg/dL (ref 70–99)
GLUCOSE-CAPILLARY: 92 mg/dL (ref 70–99)
GLUCOSE-CAPILLARY: 313 mg/dL — AB (ref 70–99)
Glucose-Capillary: 66 mg/dL — ABNORMAL LOW (ref 70–99)
Glucose-Capillary: 233 mg/dL — ABNORMAL HIGH (ref 70–99)
Glucose-Capillary: 123 mg/dL — ABNORMAL HIGH (ref 70–99)

## 2017-09-04 LAB — HEPATIC FUNCTION PANEL
ALBUMIN: 2.6 g/dL — AB (ref 3.5–5.0)
ALK PHOS: 82 U/L (ref 38–126)
ALT: 33 U/L (ref 0–44)
AST: 25 U/L (ref 15–41)
BILIRUBIN DIRECT: 0.1 mg/dL (ref 0.0–0.2)
BILIRUBIN TOTAL: 0.6 mg/dL (ref 0.3–1.2)
Indirect Bilirubin: 0.5 mg/dL (ref 0.3–0.9)
Total Protein: 5.8 g/dL — ABNORMAL LOW (ref 6.5–8.1)

## 2017-09-04 LAB — BASIC METABOLIC PANEL
Anion gap: 13 (ref 5–15)
BUN: 32 mg/dL — AB (ref 6–20)
CHLORIDE: 98 mmol/L (ref 98–111)
CO2: 34 mmol/L — ABNORMAL HIGH (ref 22–32)
Calcium: 9.2 mg/dL (ref 8.9–10.3)
Creatinine, Ser: 1.55 mg/dL — ABNORMAL HIGH (ref 0.44–1.00)
GFR calc non Af Amer: 37 mL/min — ABNORMAL LOW (ref >60)
GFR, EST AFRICAN AMERICAN: 43 mL/min — AB (ref >60)
Glucose, Bld: 55 mg/dL — ABNORMAL LOW (ref 70–99)
POTASSIUM: 2.9 mmol/L — AB (ref 3.5–5.1)
SODIUM: 145 mmol/L (ref 135–145)

## 2017-09-04 LAB — MAGNESIUM: MAGNESIUM: 2.2 mg/dL (ref 1.7–2.4)

## 2017-09-04 LAB — AMMONIA: AMMONIA: 33 umol/L (ref 9–35)

## 2017-09-04 LAB — CK: CK TOTAL: 63 U/L (ref 38–234)

## 2017-09-04 MED ORDER — INSULIN ASPART 100 UNIT/ML ~~LOC~~ SOLN: 0.0000 [IU] | Freq: Three times a day (TID) | SUBCUTANEOUS | Status: DC

## 2017-09-04 MED ORDER — POTASSIUM CHLORIDE CRYS ER 20 MEQ PO TBCR
40.0000 meq | EXTENDED_RELEASE_TABLET | ORAL | Status: AC
  Administered 2017-09-04 (×2): 40 meq via ORAL
  Filled 2017-09-04 (×2): qty 2

## 2017-09-04 MED ORDER — INSULIN REGULAR HUMAN (CONC) 500 UNIT/ML ~~LOC~~ SOPN: 40.0000 [IU] | PEN_INJECTOR | Freq: Every day | SUBCUTANEOUS | Status: DC

## 2017-09-04 MED ORDER — ACETAMINOPHEN 650 MG RE SUPP: 650.0000 mg | Freq: Four times a day (QID) | RECTAL | Status: DC | PRN

## 2017-09-04 MED ORDER — MORPHINE SULFATE (PF) 2 MG/ML IV SOLN
1.0000 mg | INTRAVENOUS | Status: DC | PRN
  Administered 2017-09-04: 1 mg via INTRAVENOUS
  Filled 2017-09-04: qty 1

## 2017-09-04 MED ORDER — ACETAMINOPHEN 325 MG PO TABS
650.0000 mg | ORAL_TABLET | Freq: Four times a day (QID) | ORAL | Status: DC | PRN
  Administered 2017-09-06 – 2017-09-09 (×8): 650 mg via ORAL
  Filled 2017-09-04 (×8): qty 2

## 2017-09-04 MED ORDER — INSULIN GLARGINE 100 UNIT/ML ~~LOC~~ SOLN
40.0000 [IU] | Freq: Two times a day (BID) | SUBCUTANEOUS | Status: DC
  Filled 2017-09-04: qty 0.4

## 2017-09-04 MED ORDER — INSULIN ASPART 100 UNIT/ML ~~LOC~~ SOLN: 0.0000 [IU] | Freq: Every day | SUBCUTANEOUS | Status: DC

## 2017-09-04 MED ORDER — INSULIN ASPART 100 UNIT/ML ~~LOC~~ SOLN
0.0000 [IU] | Freq: Every day | SUBCUTANEOUS | Status: DC
  Administered 2017-09-05 – 2017-09-08 (×3): 2 [IU] via SUBCUTANEOUS

## 2017-09-04 MED ORDER — INSULIN ASPART 100 UNIT/ML ~~LOC~~ SOLN
0.0000 [IU] | Freq: Three times a day (TID) | SUBCUTANEOUS | Status: DC
  Administered 2017-09-04: 7 [IU] via SUBCUTANEOUS
  Administered 2017-09-04: 3 [IU] via SUBCUTANEOUS
  Administered 2017-09-05: 5 [IU] via SUBCUTANEOUS
  Administered 2017-09-05: 2 [IU] via SUBCUTANEOUS
  Administered 2017-09-06: 5 [IU] via SUBCUTANEOUS
  Administered 2017-09-06 (×2): 9 [IU] via SUBCUTANEOUS
  Administered 2017-09-07: 5 [IU] via SUBCUTANEOUS
  Administered 2017-09-07: 9 [IU] via SUBCUTANEOUS
  Administered 2017-09-07: 5 [IU] via SUBCUTANEOUS
  Administered 2017-09-08: 1 [IU] via SUBCUTANEOUS
  Administered 2017-09-09: 9 [IU] via SUBCUTANEOUS
  Administered 2017-09-09: 5 [IU] via SUBCUTANEOUS
  Administered 2017-09-09: 3 [IU] via SUBCUTANEOUS

## 2017-09-04 MED ORDER — FUROSEMIDE 80 MG PO TABS
80.0000 mg | ORAL_TABLET | Freq: Every day | ORAL | Status: DC
  Administered 2017-09-05: 80 mg via ORAL
  Filled 2017-09-04: qty 1

## 2017-09-04 MED ORDER — TEMAZEPAM 7.5 MG PO CAPS
7.5000 mg | ORAL_CAPSULE | Freq: Every evening | ORAL | Status: DC | PRN
  Administered 2017-09-04 – 2017-09-08 (×5): 7.5 mg via ORAL
  Filled 2017-09-04 (×5): qty 1

## 2017-09-04 MED ORDER — INSULIN GLARGINE 100 UNIT/ML ~~LOC~~ SOLN
60.0000 [IU] | Freq: Every day | SUBCUTANEOUS | Status: DC
  Administered 2017-09-04 – 2017-09-06 (×3): 60 [IU] via SUBCUTANEOUS
  Filled 2017-09-04 (×3): qty 0.6

## 2017-09-04 MED ORDER — CLONAZEPAM 0.25 MG PO TBDP
0.2500 mg | ORAL_TABLET | Freq: Two times a day (BID) | ORAL | Status: DC | PRN
  Administered 2017-09-05 – 2017-09-09 (×6): 0.25 mg via ORAL
  Filled 2017-09-04 (×6): qty 1

## 2017-09-04 MED ORDER — INSULIN GLARGINE 100 UNIT/ML ~~LOC~~ SOLN: 50.0000 [IU] | Freq: Two times a day (BID) | SUBCUTANEOUS | Status: DC

## 2017-09-04 MED ORDER — INSULIN ASPART 100 UNIT/ML ~~LOC~~ SOLN
5.0000 [IU] | Freq: Three times a day (TID) | SUBCUTANEOUS | Status: DC
  Administered 2017-09-04 (×2): 5 [IU] via SUBCUTANEOUS

## 2017-09-04 NOTE — Progress Notes (Signed)
  Speech Language Pathology Treatment: Dysphagia  Patient Details Name: [name] MRN: [id number] DOB: November 08, 1965 Today's Date: 09/04/2017 Time: 5462-7035 SLP Time Calculation (min) (ACUTE ONLY): 14 min  Assessment / Plan / Recommendation Clinical Impression   Pt c/o burning and nauseating pain in lower stomach upon greeting. Mentation appeared mildly impaired when answering questions about breakfast. After reponsitioning and utilization of deep breaths for relaxation and pain managemnt, she accepted 3-4 sips of nectar thick liquid from cup with one incidence of coughing but clear vocal quality. It should be noted pt exhibited baseline congestion evident in audible breathing sounds and occasional throat clear. Pt also demonstrated impulsive behaviors evident in large sips, but improved with min-mod cues from clinician. SLP educated pt and RN regarding aspiration precautions, diet recommendations, and compensatory strategies. Given current clinical presentation and need for reinforcement to use compensatory strategies, ST will continue to follow up and recommend current dysphagia 3 diet with nectar thick liquid, straws allowed.   HPI HPI: [name] is a 52 y.o. female with medical history significant of COPD, type 2 diabetes mellitus, HIV, diastolic heart failure, dyslipidemia and chronic pain syndrome.  Patient was discharged from the hospital August 27 after being treated for COPD exacerbation.  Apparently she lives at home by herself.  She reported experiencing a syncope episode in her bedroom, unclear about prodromal symptoms, but apparently after recovering her consciousness she was not able to stand.  She had a left BKA and uses a prosthesis.  At the time of her fall she was not using the prosthesis.  She spent about 24 to 48 hours on the floor, until she was found by her daughter-in-law who brought her to the hospital. hos been observed to cough while eating. no SLP history in chart.        SLP Plan  Continue with current plan of care       Recommendations  Diet recommendations: Dysphagia 3 (mechanical soft);Nectar-thick liquid Liquids provided via: Cup;Straw Medication Administration: Whole meds with liquid Supervision: Patient able to self feed Compensations: Minimize environmental distractions;Slow rate;Small sips/bites;Clear throat intermittently Postural Changes and/or Swallow Maneuvers: Seated upright 90 degrees                Oral Care Recommendations: Oral care BID Follow up Recommendations: Skilled Nursing facility SLP Visit Diagnosis: Dysphagia, pharyngeal phase (R13.13) Plan: Continue with current plan of care       Jettie Booze, Student SLP                Jettie Booze 09/04/2017, 4:04 PM

## 2017-09-04 NOTE — Clinical Social Work Note (Signed)
CSW called patient's daughter-in-law and provided bed offers. She has chosen [facility] and they are able to offer a long-term bed after rehab if needed. Please notify CSW 24-48 hours prior to discharge so insurance authorization can be started.  Dayton Scrape, [place]

## 2017-09-04 NOTE — Progress Notes (Signed)
PROGRESS NOTE   [name]  HKF:[id number]    DOB: 05/17/1965    DOA: 08/29/2017  PCP: [name], PA-C   I have briefly reviewed patients previous medical records in [facility].  Brief Narrative:  52 year old female with PMH of asthma/COPD, stage IV chronic kidney disease, morbid obesity, anxiety and depression, type II DM/IDDM on high doses of insulins, HLD, HTN, GERD, peripheral neuropathy, HIV, OHS, chronic diastolic CHF, left BKA uses prosthesis and chronic pain syndrome, recent hospitalization 08/26/2017-08/27/2017 for COPD exacerbation, apparently living at home by herself, returned to ED on 08/29/2017 after reportedly experiencing a syncopal episode at home, fell and was unable to get up from the floor and stayed on the floor for 24 to 48 hours until she was found by her daughter-in-law who brought her to the hospital.  She was admitted for evaluation and management of syncope, acute on chronic kidney disease complicated by hypernatremia, hyperkalemia, anion gap metabolic acidosis   Assessment & Plan:   Active Problems:   Human immunodeficiency virus (HIV) disease ([place])   Diabetes mellitus type 2 with complications, uncontrolled ([place])   Mood disorder ([place])   Essential hypertension   COPD with acute bronchitis ([place])   Tobacco abuse   Repeated falls   Diabetic neuropathy ([place])   Obesity hypoventilation syndrome ([place])   Hyperlipidemia   CKD (chronic kidney disease), stage III ([name])   Syncope   Syncope: Details surrounding the event not clear.  Etiology hence difficult to determine.  DD: Vasovagal, arrhythmias, dehydration with orthostatic changes.  CT head and cervical spine without acute findings.  Unable to perform orthostatic vital signs due to mental status changes and BKA.  EKG on admission showed A. fib, repeat EKG showed atrial flutter but cardiology indicated that it was an artifact.  TSH normal.  TTE 8/30: Difficult study.  LV systolic function grossly  normal.  Telemetry personally reviewed and showed sinus rhythm.  PT recommended SNF, patient initially refused and then agreeable.  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  Acute encephalopathy: Unclear etiology.  CT head without acute findings.  MRI requested and pending.  Could be due to polypharmacy: Patient on Abilify 15 mg daily, doxepin 10 mg at bedtime, PRN Klonopin, PRN IV morphine and Restoril 15 mg at bedtime as needed.  Attempt to minimize sedative medications and reassess in a.m.  Denied alcohol use.  Blood alcohol level on this and recent admission negative.   COPD/asthma exacerbation: Completed 3 days course of oral azithromycin.  Also completed brief course of IV Solu-Medrol.  Bronchospasm clinically resolved.  Dysphagia: Reported coughing with eating.  Speech therapy evaluated and recommend dysphagia 3 diet.  Leukocytosis: Suspected due to steroid use.  No clinical focus of infection.  Follow CBC trend daily.  Hypokalemia: Replace aggressively and follow.  Magnesium 2.2.  Acute on stage III chronic kidney disease: Baseline creatinine not clear, has been fluctuating all year.  Had normal creatinine in March and 1.1 in late July.  Discharged 8/26 with creatinine 1.27 and presented with creatinine of 2.04 which had improved to 1.1 but has gradually increased to 1.55.  Unclear etiology.?  Volume depletion.  Currently not on ACEI/ARB or diuretics.  Has Foley catheter so no retention.  May be due to Lasix, held Lasix 80 mg daily for now.  Follow BMP in a.m.  Dehydration with hypernatremia: Improved.  Hypernatremia resolved.  Rhabdomyolysis: Secondary to fall and prolonged immobilization on floor PTA.  CK improved from 7 88-235.  Abnormal LFTs: Mild.  Suspect due to mild rhabdomyolysis.  Follow LFTs.  No GI symptoms reported.  Poorly controlled DM 2: Issues with marked hyper glycemia and hypoglycemia.  Discussed in detail with diabetes coordinator.  Discontinued U500, started Lantus 60 units  daily, NovoLog sensitive SSI, NovoLog 5 units 3 times daily with meals.  Monitor closely and adjust as needed.  Chronic diastolic CHF: Compensated.  HIV: Continue ART.  Tobacco abuse: Cessation counseled.  Nicotine patch.  Anxiety and depression: Management as indicated above.   DVT prophylaxis: Subcutaneous heparin Code Status: Full Family Communication: None at bedside Disposition: DC to SNF when medically improved.  Not medically ready for discharge yet.   Consultants:  None  Procedures:  Foley catheter  Antimicrobials:  Azithromycin  Subjective: Sleepy but easily arousable.  Oriented to person and place.  "I am okay".  Does not see much otherwise.  No pain or dyspnea reported.  As per RN, apart from mental status changes, no other acute events noted.  ROS: As above, otherwise unable due to mental status changes.  Objective:  Vitals:   09/04/17 0305 09/04/17 0723 09/04/17 0738 09/04/17 1135  BP: 133/86 112/76  109/72  Pulse: 98     Resp: 14 18  17   Temp: 97.8 F (36.6 C) (!) 96.9 F (36.1 C)  97.9 F (36.6 C)  TempSrc: Oral Axillary  Oral  SpO2: 95% 92% 96%   Weight:      Height:        Examination:  General exam: Young female, moderately built and obese, lying comfortably propped up in bed without distress. Respiratory system: Clear to auscultation. Respiratory effort normal. Cardiovascular system: S1 & S2 heard, RRR. No JVD, murmurs, rubs, gallops or clicks. No pedal edema.  Telemetry personally reviewed: Sinus rhythm. Gastrointestinal system: Abdomen is nondistended, soft and nontender. No organomegaly or masses felt. Normal bowel sounds heard. Central nervous system: Mental status as indicated above.  Follows simple instructions. No focal neurological deficits. Extremities: Symmetric 5 x 5 power.  Left BKA.  Upper extremity with some bruises but no active bleeding. Skin: No rashes, lesions or ulcers Psychiatry: Judgement and insight impaired. Mood &  affect cannot be assessed at this time.     Data Reviewed: I have personally reviewed following labs and imaging studies  CBC: Recent Labs  Lab 08/29/17 1222 08/30/17 0558 09/01/17 0232 09/02/17 1254  WBC 15.4* 14.1* 22.4* 22.5*  NEUTROABS 13.4*  --   --   --   HGB 16.2* 12.6 12.6 13.8  HCT 51.8* 39.2 38.9 42.7  MCV 90.2 88.7 87.8 87.3  PLT 306 221 259 694   Basic Metabolic Panel: Recent Labs  Lab 08/31/17 0237 09/01/17 0232 09/02/17 1254 09/03/17 1528 09/04/17 0317  NA 136 140 141 144 145  K 4.9 4.1 3.2* 3.0* 2.9*  CL 101 104 96* 97* 98  CO2 21* 24 33* 33* 34*  GLUCOSE 430* 292* 434* 158* 55*  BUN 42* 29* 29* 31* 32*  CREATININE 1.45* 1.11* 1.38* 1.29* 1.55*  CALCIUM 8.6* 8.9 8.9 9.3 9.2  MG  --   --   --   --  2.2   Liver Function Tests: Recent Labs  Lab 08/29/17 1222  AST 75*  ALT 63*  ALKPHOS 86  BILITOT 2.0*  PROT 6.6  ALBUMIN 3.2*   Cardiac Enzymes: Recent Labs  Lab 08/29/17 1222 08/30/17 0558 08/30/17 0958 08/30/17 1430 08/30/17 2119  CKTOTAL 788* 235*  --   --   --   TROPONINI  --   --  0.04* 0.04* <0.03   CBG: Recent Labs  Lab 09/03/17 2108 09/04/17 0412 09/04/17 0812 09/04/17 0848 09/04/17 1223  GLUCAP 113* 92 66* 92 313*    Recent Results (from the past 240 hour(s))  MRSA PCR Screening     Status: None   Collection Time: 08/29/17  6:24 PM  Result Value Ref Range Status   MRSA by PCR NEGATIVE NEGATIVE Final    Comment:        The GeneXpert MRSA Assay (FDA approved for NASAL specimens only), is one component of a comprehensive MRSA colonization surveillance program. It is not intended to diagnose MRSA infection nor to guide or monitor treatment for MRSA infections. Performed at [facility], [place] [address], [place], [place] [address]          Radiology Studies: Dg Swallowing Func-speech Pathology  Result Date: 09/03/2017 Objective Swallowing Evaluation: Type of Study: MBS-Modified Barium Swallow Study   Patient Details Name: TALLY MATTOX MRN: [id number] Date of Birth: 1965/11/04 Today's Date: 09/03/2017 Time: SLP Start Time (ACUTE ONLY): 1405 -SLP Stop Time (ACUTE ONLY): 1417 SLP Time Calculation (min) (ACUTE ONLY): 12 min Past Medical History: Past Medical History: Diagnosis Date . Acute metabolic encephalopathy 08/08/8108 . Acute on chronic respiratory failure with hypoxia ([place]) 05/19/2016 . Anxiety  . ARF (acute renal failure) ([place]) 05/03/2014 . Arthritis   LEFT HIP . Asthma   HOSPITALIZED WITH EXCERBATION OF ASTHMA - AND BRONCHITIS AND THE FLU DEC 2013 . Asthma exacerbation attacks 12/28/2011 . Bronchitis 12/28/2011 . CAP (community acquired pneumonia) 07/25/2012 . Chronic kidney disease (CKD), stage IV (severe) ([place]) 11/10/2014 . Class 3 obesity due to excess calories with serious comorbidity and body mass index (BMI) of 40.0 to 44.9 in adult  . Colles' fracture of left radius  . COPD (chronic obstructive pulmonary disease) ([place])  . Depression  . Diabetes mellitus   ON INSULIN AND ORAL MEDICATIONS . Diabetes mellitus type 2 with complications, uncontrolled ([place]) 04/19/2008  Qualifier: Diagnosis of  By: Tomma Lightning MD, Claiborne Billings    . Diabetic foot infection ([place]) 11/09/2014 . Diabetic ketoacidosis without coma associated with type 2 diabetes mellitus ([place])  . Diabetic neuropathy ([place]) 11/10/2014 . Diarrhea 08/30/2008  Qualifier: Diagnosis of  By: Tomma Lightning MD, Claiborne Billings   . Diastolic dysfunction 31/05/9456 . Difficult intravenous access  . DKA, type 2 ([place]) 04/04/2016 . Dyslipidemia 12/23/2008  Qualifier: Diagnosis of  By: Tomma Lightning MD, Claiborne Billings   . Essential hypertension 04/19/2008  Qualifier: Diagnosis of  By: Tomma Lightning MD, Claiborne Billings   . FATIGUE 07/12/2008  Qualifier: Diagnosis of  By: Tomma Lightning MD, Claiborne Billings   . Femoral neck fracture ([place]) 07/16/2011 . Femur fracture, left ([place]) 07/08/2014 . Fever  . Fracture of distal femur ([place]) 07/08/2014 . GERD (gastroesophageal reflux disease)  . Hereditary and idiopathic peripheral neuropathy 04/19/2008  Qualifier:  Diagnosis of  By: Tomma Lightning MD, Claiborne Billings   . Hip fracture, left ([place]) 07/16/2011 . Hip pain  . HIV infection ([place]) 2000 . Human immunodeficiency virus (HIV) disease ([place]) 04/19/2008  [id number] +  . Hyperlipidemia  . Hypertension  . Hyponatremia 12/29/2011 . Influenza A 12/29/2011 . Influenza B  . Insomnia 06/12/2016 . Left hip pain 05/03/2014 . Mood disorder ([place]) 04/19/2008  Qualifier: Diagnosis of  By: Tomma Lightning MD, Claiborne Billings   . Neuropathy   NEUROPATHY HANDS AND FEET . NSTEMI (non-ST elevated myocardial infarction) ([place]) 05/19/2016 . Obesity hypoventilation syndrome ([place])  . Osteoporosis 07/08/2014 . Pain   SEVERE PAIN LEFT HIP - HX OF LEFT HIP PINNING  JULY 2013 . Perimenopausal symptoms 02/13/2012  LMP around 2011. On estrace and provera since around 2012 for hot flashes, mood swings. Estrace 2 mg daily, Provera 2.5 mg for 5 days each month.  . Repeated falls 07/30/2011 . Sepsis ([place]) 04/04/2016 . Shortness of breath   ALLERGIES ARE "ACTING UP" . SOB (shortness of breath)  . THRUSH 05/20/2008  Qualifier: Diagnosis of  By: Tomma Lightning MD, Claiborne Billings     . Tobacco use disorder 04/14/2010 Past Surgical History: Past Surgical History: Procedure Laterality Date . AMPUTATION Left 11/18/2014  Procedure: LEFT BELOW KNEE AMPUTATION;  Surgeon: Leandrew Koyanagi, MD;  Location: [facility];  Service: Orthopedics;  Laterality: Left; . CAST APPLICATION Left 07/08/4126  Procedure: CAST APPLICATION (FIBERGLASS);  Surgeon: Latanya Maudlin, MD;  Location: [facility];  Service: Orthopedics;  Laterality: Left;  CLOSED REDUCTION OF LEFT COLLES FRACTURE WITH SHORT ARM CAST . HARDWARE REMOVAL Left 05/05/2014  Procedure: HARDWARE REMOVAL LEFT HIP;  Surgeon: Latanya Maudlin, MD;  Location: [facility];  Service: Orthopedics;  Laterality: Left;  REMOVAL BIOMET 6.5-8.0 CANNULATED SCREW . HIP ARTHROPLASTY Left 05/05/2014  Procedure: ARTHROPLASTY OPEN REDUCTION INTERNAL FIXATION LEFT HIP AND REMOVAL OF TWO CANNULATED SCREW;  Surgeon: Latanya Maudlin, MD;  Location: [facility];  Service: Orthopedics;   Laterality: Left; . HIP PINNING,CANNULATED  07/16/2011  Procedure: CANNULATED HIP PINNING;  Surgeon: Gearlean Alf, MD;  Location: [facility];  Service: Orthopedics;  Laterality: Left; . HIP PINNING,CANNULATED Left 04/09/2012  Procedure: CANNULATED HIP PINNING AND HARDWARE REVISION;  Surgeon: Gearlean Alf, MD;  Location: [facility];  Service: Orthopedics;  Laterality: Left; . I&D EXTREMITY Left 11/16/2014  Procedure:  DEBRIDEMENT OF LEFT FOOT POSSIBLE APPLICATION OF INTEGRIA AND VAC ;  Surgeon: Irene Limbo, MD;  Location: [facility];  Service: Plastics;  Laterality: Left; . PERIPHERAL VASCULAR CATHETERIZATION N/A 11/15/2014  Procedure: Abdominal Aortogram;  Surgeon: Conrad Valle, MD;  Location: [facility];  Service: Cardiovascular;  Laterality: N/A; . PERIPHERAL VASCULAR CATHETERIZATION  11/15/2014  Procedure: Lower Extremity Angiography;  Surgeon: Conrad King George, MD;  Location: [facility];  Service: Cardiovascular;; . PERIPHERAL VASCULAR CATHETERIZATION Left 11/15/2014  Procedure: Peripheral Vascular Intervention;  Surgeon: Conrad , MD;  Location: [facility];  Service: Cardiovascular;  Laterality: Left;  popliteal artery stenting HPI: AVEREIGH SPAINHOWER is a 52 y.o. female with medical history significant of COPD, type 2 diabetes mellitus, HIV, diastolic heart failure, dyslipidemia and chronic pain syndrome.  Patient was discharged from the hospital August 27 after being treated for COPD exacerbation.  Apparently she lives at home by herself.  She reported experiencing a syncope episode in her bedroom, unclear about prodromal symptoms, but apparently after recovering her consciousness she was not able to stand.  She had a left BKA and uses a prosthesis.  At the time of her fall she was not using the prosthesis.  She spent about 24 to 48 hours on the floor, until she was found by her daughter-in-law who brought her to the hospital. hos been observed to cough while eating. no SLP history in chart.   No data recorded Assessment / Plan / Recommendation CHL IP CLINICAL IMPRESSIONS 09/03/2017 Clinical Impression Pharyngeal swallow was initiated at the pyriform sinuses consistently across liquid consistencies with intermittent penetration. Laryngeal closure incomplete allowing penetration of thin barium via straw onto pt's vocal cords without awareness with cued coughed mobilizing barium higher in vestibule but not clearing. Decreasing volume and velocity of bolus with straw removal was  not effective and pt presently not reliable for assessing additional strategies due to awareness/alertness. It should be noted pt with frequent coughing at baseline prior to barium presentations and during study at times with questionable barium in vestibule. Minimal vallecular and pyriform sinus residue cleared with additional swallows. Body habitus prevented esophageal scan. Safest recommendations are continuation of nectar thick liquids, straws allowed, Dys 3 texture, pills whole in puree and volitional cough with continued ST for intervention and appropriateness for upgrade.  SLP Visit Diagnosis Dysphagia, pharyngeal phase (R13.13) Attention and concentration deficit following -- Frontal lobe and executive function deficit following -- Impact on safety and function Moderate aspiration risk   CHL IP TREATMENT RECOMMENDATION 09/03/2017 Treatment Recommendations Therapy as outlined in treatment plan below   Prognosis 09/03/2017 Prognosis for Safe Diet Advancement Good Barriers to Reach Goals Cognitive deficits Barriers/Prognosis Comment -- CHL IP DIET RECOMMENDATION 09/03/2017 SLP Diet Recommendations Dysphagia 3 (Mech soft) solids;Nectar thick liquid Liquid Administration via Cup;Straw Medication Administration Whole meds with puree Compensations Slow rate;Small sips/bites;Clear throat intermittently Postural Changes Seated upright at 90 degrees   CHL IP OTHER RECOMMENDATIONS 09/03/2017 Recommended Consults -- Oral Care Recommendations Oral  care BID Other Recommendations --   CHL IP FOLLOW UP RECOMMENDATIONS 09/03/2017 Follow up Recommendations Skilled Nursing facility   [facility] IP FREQUENCY AND DURATION 09/03/2017 Speech Therapy Frequency (ACUTE ONLY) min 2x/week Treatment Duration 2 weeks      CHL IP ORAL PHASE 09/03/2017 Oral Phase WFL Oral - Pudding Teaspoon -- Oral - Pudding Cup -- Oral - Honey Teaspoon -- Oral - Honey Cup -- Oral - Nectar Teaspoon -- Oral - Nectar Cup -- Oral - Nectar Straw -- Oral - Thin Teaspoon -- Oral - Thin Cup -- Oral - Thin Straw -- Oral - Puree -- Oral - Mech Soft -- Oral - Regular -- Oral - Multi-Consistency -- Oral - Pill -- Oral Phase - Comment --  CHL IP PHARYNGEAL PHASE 09/03/2017 Pharyngeal Phase Impaired Pharyngeal- Pudding Teaspoon -- Pharyngeal -- Pharyngeal- Pudding Cup -- Pharyngeal -- Pharyngeal- Honey Teaspoon -- Pharyngeal -- Pharyngeal- Honey Cup -- Pharyngeal -- Pharyngeal- Nectar Teaspoon -- Pharyngeal -- Pharyngeal- Nectar Cup Delayed swallow initiation-pyriform sinuses Pharyngeal -- Pharyngeal- Nectar Straw Delayed swallow initiation-pyriform sinuses Pharyngeal -- Pharyngeal- Thin Teaspoon -- Pharyngeal -- Pharyngeal- Thin Cup Delayed swallow initiation-pyriform sinuses;Penetration/Aspiration during swallow Pharyngeal Material enters airway, remains ABOVE vocal cords and not ejected out Pharyngeal- Thin Straw Penetration/Aspiration during swallow;Delayed swallow initiation-pyriform sinuses Pharyngeal Material enters airway, CONTACTS cords and not ejected out Pharyngeal- Puree -- Pharyngeal -- Pharyngeal- Mechanical Soft -- Pharyngeal -- Pharyngeal- Regular WFL Pharyngeal -- Pharyngeal- Multi-consistency -- Pharyngeal -- Pharyngeal- Pill -- Pharyngeal -- Pharyngeal Comment --  CHL IP CERVICAL ESOPHAGEAL PHASE 09/03/2017 Cervical Esophageal Phase WFL Pudding Teaspoon -- Pudding Cup -- Honey Teaspoon -- Honey Cup -- Nectar Teaspoon -- Nectar Cup -- Nectar Straw -- Thin Teaspoon -- Thin Cup -- Thin Straw -- Puree --  Mechanical Soft -- Regular -- Multi-consistency -- Pill -- Cervical Esophageal Comment -- Houston Siren 09/03/2017, 2:55 PM Orbie Pyo Litaker M.Ed CCC-SLP Pager [phone]                   Scheduled Meds: . ARIPiprazole  15 mg Oral Daily  . doxepin  10 mg Oral QHS  . elvitegravir-cobicistat-emtricitabine-tenofovir  1 tablet Oral Q breakfast  . fenofibrate  160 mg Oral Daily  . fluticasone furoate-vilanterol  1 puff Inhalation Daily  . [START ON 09/05/2017] furosemide  80 mg Oral  Daily  . heparin  5,000 Units Subcutaneous Q8H  . insulin aspart  0-5 Units Subcutaneous QHS  . insulin aspart  0-9 Units Subcutaneous TID WC  . insulin aspart  5 Units Subcutaneous TID WC  . insulin glargine  60 Units Subcutaneous Daily  . magnesium oxide  400 mg Oral Daily  . mouth rinse  15 mL Mouth Rinse BID  . multivitamin with minerals  1 tablet Oral Daily  . nicotine  21 mg Transdermal Daily  . pantoprazole  40 mg Oral Daily  . polyethylene glycol  17 g Oral Daily  . potassium chloride  40 mEq Oral Q4H  . rosuvastatin  20 mg Oral Daily  . senna-docusate  2 tablet Oral BID  . sodium chloride flush  3 mL Intravenous Q12H   Continuous Infusions: . sodium chloride 10 mL/hr at 08/31/17 1600     LOS: 6 days     Vernell Leep, MD, [place], [facility]. [facility] Pager [phone]  If 7PM-7AM, please contact night-coverage [email or web address] Password [email or web address] 09/04/2017, 12:51 PM

## 2017-09-04 NOTE — Plan of Care (Signed)

## 2017-09-04 NOTE — Care Management Important Message (Signed)
Important Message  Patient Details  Name: [name] MRN: [id number] Date of Birth: 30-Nov-1965   Medicare Important Message Given:  Yes    [name] P [name] 09/04/2017, 3:40 PM

## 2017-09-04 NOTE — Progress Notes (Addendum)
Inpatient Diabetes Program Recommendations  AACE/ADA: New Consensus Statement on Inpatient Glycemic Control (2019)  Target Ranges:  Prepandial:   less than 140 mg/dL      Peak postprandial:   less than 180 mg/dL (1-2 hours)      Critically ill patients:  140 - 180 mg/dL  Results for ANAEL, ROSCH (MRN [id number]) as of 09/04/2017 13:23  Ref. Range 09/04/2017 12:23  Glucose-Capillary Latest Ref Range: 70 - 99 mg/dL 313 (H)   Results for NANETTA, WIEGMAN (MRN [id number]) as of 09/04/2017 09:41  Ref. Range 09/03/2017 07:52 09/03/2017 12:09 09/03/2017 16:47 09/03/2017 21:08 09/04/2017 04:12 09/04/2017 08:12 09/04/2017 08:48  Glucose-Capillary Latest Ref Range: 70 - 99 mg/dL 207 (H)  Novolog 7 units  Lantus 60 units @10 :42 211 (H)   Novolog 7 units 156 (H)  Novolog 4 units  U500 35 units @ 17:51 113 (H)   Lantus 40 units @ 23:07 92 66 (L) 92   Review of Glycemic Control  Diabetes history: DM2 Outpatient Diabetes medications: Toujeo 60 units BID, Humulin R U500 60 units with breakfast, 50 units with lunch, 35 units with supper, Victoza 1.8 mg daily Current orders for Inpatient glycemic control: U500 40 units with breakfast, U500 40 units with lunch, U500 35 units with supper, Lantus 40 units BID, Novolog 0-9 units TID with meals, Novolog 0-5 units QHS  Inpatient Diabetes Program Recommendations:  Insulin - Basal: Please discontinue U500 while inpatient. Patient received Lantus 60 units at 10:42 am, U500 35 units at 17:51, and Lantus 40 units at 23:07 on 09/03/17. Fasting glucose 66 mg/dl this morning. Noted Lantus was discontinued. Please re-order Lantus at 70 units daily. Insulin - Meal Coverage: Please consider ordering Novolog 5 units TID with meals for meal coverage if patient eats at least 50% of meals. HgbA1C: A1C 8.3% on 07/29/17 indicating an average glucose of 192 mg/dl.  NOTE: Noted in reviewing chart, patient is followed by DR. Dwyane Dee and was last seen on 08/13/17 (see note for details).    Addendum 09/04/17@13 :14-Spoke with patient about diabetes and home regimen for diabetes control. Patient lying in bed with eyes closed but she did respond to questions appropriately. Patient confirms that she sees Dr. Dwyane Dee for DM control. Patient reports that she takes: Toujeo BID, Humulin R U500 (draws up to 17 units on U100 syringe which is equivalent to 85 units of U500) TID with meals, and Victoza 1.8 mg daily. When asked about Toujeo insulin dosage patient stated "I don't know. I take what the doctor tells me to take."   Patient states that she checks her glucose 3 times per day. When asked what glucose trends at home patient replied "It runs good." When asked for specific numbers on glucometer patient stated "I don't remember but it is good."   Discussed last A1C results in the chart (8.3% on 07/29/17). Discussed glucose and A1C goals. Inquired if patient felt any symptoms of hypoglycemia this morning and patient states that she did not feel low and denies any symptoms of hypoglycemia. Discussed current glycemic control and inquired about diet at home. Patient stated " I am tired of you asking me all these questions. I want to rest."  Explained to patient that information was needed to help with making appropriate insulin recommendations while inpatient. Patient states she has a good appetite and plans to eat lunch. Patient verbalized understanding of information discussed and she states that she has no further questions at this time related to diabetes. Noon CBG up  to 313 mg/dl. Will plan to recheck glucose this afternoon.   Thanks, Barnie Alderman, RN, MSN, CDE Diabetes Coordinator Inpatient Diabetes Program [phone] (Team Pager from 8am to 5pm)

## 2017-09-05 ENCOUNTER — Inpatient Hospital Stay (HOSPITAL_COMMUNITY): Payer: Medicare HMO

## 2017-09-05 LAB — BASIC METABOLIC PANEL
Anion gap: 12 (ref 5–15)
BUN: 26 mg/dL — AB (ref 6–20)
CHLORIDE: 99 mmol/L (ref 98–111)
CO2: 29 mmol/L (ref 22–32)
CREATININE: 1.31 mg/dL — AB (ref 0.44–1.00)
Calcium: 8.7 mg/dL — ABNORMAL LOW (ref 8.9–10.3)
GFR calc Af Amer: 53 mL/min — ABNORMAL LOW (ref >60)
GFR, EST NON AFRICAN AMERICAN: 46 mL/min — AB (ref >60)
GLUCOSE: 68 mg/dL — AB (ref 70–99)
POTASSIUM: 3.6 mmol/L (ref 3.5–5.1)
SODIUM: 140 mmol/L (ref 135–145)

## 2017-09-05 LAB — GLUCOSE, CAPILLARY
GLUCOSE-CAPILLARY: 98 mg/dL (ref 70–99)
GLUCOSE-CAPILLARY: 190 mg/dL — AB (ref 70–99)
GLUCOSE-CAPILLARY: 214 mg/dL — AB (ref 70–99)
Glucose-Capillary: 122 mg/dL — ABNORMAL HIGH (ref 70–99)
Glucose-Capillary: 279 mg/dL — ABNORMAL HIGH (ref 70–99)

## 2017-09-05 LAB — CBC
HCT: 43.5 % (ref 36.0–46.0)
Hemoglobin: 13.6 g/dL (ref 12.0–15.0)
MCH: 28 pg (ref 26.0–34.0)
MCHC: 31.3 g/dL (ref 30.0–36.0)
MCV: 89.7 fL (ref 78.0–100.0)
PLATELETS: 321 10*3/uL (ref 150–400)
RBC: 4.85 MIL/uL (ref 3.87–5.11)
RDW: 19.2 % — ABNORMAL HIGH (ref 11.5–15.5)
WBC: 25.1 10*3/uL — ABNORMAL HIGH (ref 4.0–10.5)

## 2017-09-05 MED ORDER — INSULIN ASPART 100 UNIT/ML ~~LOC~~ SOLN
7.0000 [IU] | Freq: Three times a day (TID) | SUBCUTANEOUS | Status: DC
  Administered 2017-09-05 – 2017-09-06 (×5): 7 [IU] via SUBCUTANEOUS

## 2017-09-05 MED ORDER — MORPHINE SULFATE (PF) 4 MG/ML IV SOLN
4.0000 mg | Freq: Once | INTRAVENOUS | Status: AC
  Administered 2017-09-05: 4 mg via INTRAVENOUS
  Filled 2017-09-05: qty 1

## 2017-09-05 NOTE — Progress Notes (Signed)
  Speech Language Pathology Treatment:    Patient Details Name: [name] MRN: [id number] DOB: [birth year] Today's Date: 09/05/2017 Time: 0915-0930 SLP Time Calculation (min) (ACUTE ONLY): 15 min  Assessment / Plan / Recommendation Clinical Impression  Pt seen with am meal, participatory, less delirium noted during session. Allowed pt to feed herself without intervening for appropriate positioning or rate to determine what he safety awareness or tolerance is at baseline. Pt ate entire meal at a rapid rate, overloading mouth, partially reclined and side lying. Despite this, she tolerate soft solids very well. Initially pt was able to consume 4 oz of nectar without signs of aspiration and thus thin liquids were trials, though these did continue to elicit immediate coughing suspicious for sensation of penetration in the airway. Recommend pt continue current diet but with ongoing supervision and assist for set up and precautions as pt continues to be impulsive.   HPI        SLP Plan  Continue with current plan of care       Recommendations  Diet recommendations: Dysphagia 3 (mechanical soft);Nectar-thick liquid Liquids provided via: Cup;Straw Medication Administration: Whole meds with liquid Supervision: Patient able to self feed Compensations: Minimize environmental distractions;Slow rate;Small sips/bites;Clear throat intermittently Postural Changes and/or Swallow Maneuvers: Seated upright 90 degrees                Oral Care Recommendations: Oral care BID Follow up Recommendations: Skilled Nursing facility SLP Visit Diagnosis: Dysphagia, pharyngeal phase (R13.13) Plan: Continue with current plan of care       GO               [facility], [place] CCC-SLP [phone]  [place] [place] 09/05/2017, 12:00 PM

## 2017-09-05 NOTE — Plan of Care (Signed)
  Problem: Clinical Measurements: Goal: Ability to maintain clinical measurements within normal limits will improve Outcome: Progressing Goal: Will remain free from infection Outcome: Progressing Goal: Diagnostic test results will improve Outcome: Progressing Goal: Respiratory complications will improve Outcome: Progressing Goal: Cardiovascular complication will be avoided Outcome: Progressing   Problem: Pain Managment: Goal: General experience of comfort will improve Outcome: Progressing   Problem: Skin Integrity: Goal: Risk for impaired skin integrity will decrease Outcome: Progressing   Problem: Education: Goal: Knowledge of General Education information will improve Description Including pain rating scale, medication(s)/side effects and non-pharmacologic comfort measures Outcome: Not Progressing   Problem: Health Behavior/Discharge Planning: Goal: Ability to manage health-related needs will improve Outcome: Not Progressing   Problem: Activity: Goal: Risk for activity intolerance will decrease Outcome: Not Progressing   Problem: Nutrition: Goal: Adequate nutrition will be maintained Outcome: Not Progressing   Problem: Coping: Goal: Level of anxiety will decrease Outcome: Not Progressing   Problem: Elimination: Goal: Will not experience complications related to bowel motility Outcome: Not Progressing Goal: Will not experience complications related to urinary retention Outcome: Not Progressing   Problem: Safety: Goal: Ability to remain free from injury will improve Outcome: Not Progressing

## 2017-09-05 NOTE — Progress Notes (Signed)
Inpatient Diabetes Program Recommendations  AACE/ADA: New Consensus Statement on Inpatient Glycemic Control (2019)  Target Ranges:  Prepandial:   less than 140 mg/dL      Peak postprandial:   less than 180 mg/dL (1-2 hours)      Critically ill patients:  140 - 180 mg/dL  Results for MIAA, LATTERELL (MRN [id number]) as of 09/05/2017 07:22  Ref. Range 09/04/2017 08:12 09/04/2017 08:48 09/04/2017 12:23 09/04/2017 17:28 09/04/2017 20:57 09/05/2017 06:36  Glucose-Capillary Latest Ref Range: 70 - 99 mg/dL 66 (L) 92   Lantus 60 units @10 :48 313 (H)  Novolog 12 units 233 (H)  Novolog 8 units 123 (H) 98    Review of Glycemic Control Diabetes history: DM2 Outpatient Diabetes medications: Toujeo 60 units BID, Humulin R U500 60 units with breakfast, 50 units with lunch, 35 units with supper, Victoza 1.8 mg daily Current orders for Inpatient glycemic control: Lantus 60 units daily, Novolog 0-9 units TID with meals, Novolog 0-5 units QHS, Novolog 5 units TID with meals  Inpatient Diabetes Program Recommendations:  Insulin-Meal Coverage: Please consider increasing meal coverage to Novolog 7 units TID with meals.  Thanks, Barnie Alderman, RN, MSN, CDE Diabetes Coordinator Inpatient Diabetes Program [phone] (Team Pager from 8am to 5pm)

## 2017-09-05 NOTE — Progress Notes (Signed)
Physical Therapy Treatment Patient Details Name: [name] MRN: [id number] DOB: 05/19/1965 Today's Date: 09/05/2017    History of Present Illness Pt is a 52 y.o. female admitted 08/29/17 after reportedly having syncopal episode with fall at home and being found down 24-48 hours later. AKI and hypernatremic. Mild rhabdomyolysis, confusion. CT head and cervical spine clear. PMH includes COPD, DMII, HIV, HF, chronic pain, L BKA.     PT Comments    Pt in bed after having been cleaned from BM with pt near foot of bed. Pt able to roll and position EoB with max +2 assist although required initial assist for balance. Pt able to attempt scooting but limited by need for BM although with repositioning for toileting pt then stated no need. Pt only performing bed level mobility this session due to toileting needs, confusion and weakness. SNF remains appropriate and pt reports she really does not walk at home but only pivots.   VSS  3L    Follow Up Recommendations  SNF;Supervision for mobility/OOB     Equipment Recommendations  None recommended by PT    Recommendations for Other Services       Precautions / Restrictions Precautions Precautions: Fall Precaution Comments: L BKA, prosthetic present but needs socks due to very loose fitting    Mobility  Bed Mobility Overal bed mobility: Needs Assistance Bed Mobility: Rolling;Sidelying to Sit Rolling: Max assist Sidelying to sit: Max assist;+2 for physical assistance   Sit to supine: Max assist   General bed mobility comments: max multimodal cues to roll bil with assist of pad to rotate trunk and pelvis. Max assist to elevate trunk and bring legs off of bed into sitting. max assist with gravity to return to supine. Total +2 to scoot to [place] transfer comment: did not attempt as pt scooting EOB stated need for BM and returned to supine  Ambulation/Gait             General Gait Details:  unable   Stairs             Wheelchair Mobility    Modified Rankin (Stroke Patients Only)       Balance Overall balance assessment: Needs assistance Sitting-balance support: Bilateral upper extremity supported Sitting balance-Leahy Scale: Fair Sitting balance - Comments: progressed from min assist to minguard once leg touching the floor                                    Cognition Arousal/Alertness: Awake/alert Behavior During Therapy: Flat affect Overall Cognitive Status: No family/caregiver present to determine baseline cognitive functioning Area of Impairment: Following commands;Safety/judgement;Problem solving                   Current Attention Level: Sustained   Following Commands: Follows one step commands with increased time Safety/Judgement: Decreased awareness of safety;Decreased awareness of deficits   Problem Solving: Slow processing;Decreased initiation;Difficulty sequencing;Requires verbal cues;Requires tactile cues        Exercises      General Comments General comments (skin integrity, edema, etc.): pt able to scoot minimally toward HOB in sitting but then reported need for BM, no bedpan present and pt returned to supine for positioning      Pertinent Vitals/Pain Pain Score: 5  Pain Location: back Pain Descriptors / Indicators: Aching Pain  Intervention(s): Limited activity within patient's tolerance;Repositioned    Home Living                      Prior Function            PT Goals (current goals can now be found in the care plan section) Progress towards PT goals: Progressing toward goals    Frequency    Min 2X/week      PT Plan Current plan remains appropriate    Co-evaluation              AM-PAC PT "6 Clicks" Daily Activity  Outcome Measure  Difficulty turning over in bed (including adjusting bedclothes, sheets and blankets)?: Unable Difficulty moving from lying on back to sitting on  the side of the bed? : Unable Difficulty sitting down on and standing up from a chair with arms (e.g., wheelchair, bedside commode, etc,.)?: Unable Help needed moving to and from a bed to chair (including a wheelchair)?: Total Help needed walking in hospital room?: Total Help needed climbing 3-5 steps with a railing? : Total 6 Click Score: 6    End of Session Equipment Utilized During Treatment: Oxygen Activity Tolerance: Patient limited by fatigue Patient left: in bed;with call bell/phone within reach;with bed alarm set Nurse Communication: Mobility status;Need for lift equipment PT Visit Diagnosis: Other abnormalities of gait and mobility (R26.89);Repeated falls (R29.6)     Time: 3729-0211 PT Time Calculation (min) (ACUTE ONLY): 17 min  Charges:  $Therapeutic Activity: 8-22 mins                     [place], PT Acute Rehabilitation Services Pager: [phone] Office: [place] 09/05/2017, 1:42 PM

## 2017-09-05 NOTE — Progress Notes (Addendum)
PROGRESS NOTE   CLIMMIE CRONCE  MWU:[id number]    DOB: 1965/03/10    DOA: 08/29/2017  PCP: [name], PA-C   I have briefly reviewed patients previous medical records in [facility].  Brief Narrative:  52 year old female with PMH of asthma/COPD, stage IV chronic kidney disease, morbid obesity, anxiety and depression, type II DM/IDDM on high doses of insulins, HLD, HTN, GERD, peripheral neuropathy, HIV, OHS, chronic diastolic CHF, left BKA uses prosthesis and chronic pain syndrome, recent hospitalization 08/26/2017-08/27/2017 for COPD exacerbation, apparently living at home by herself, returned to ED on 08/29/2017 after reportedly experiencing a syncopal episode at home, fell and was unable to get up from the floor and stayed on the floor for 24 to 48 hours until she was found by her daughter-in-law who brought her to the hospital.  She was admitted for evaluation and management of syncope, acute on chronic kidney disease complicated by hypernatremia, hyperkalemia, anion gap metabolic acidosis   Assessment & Plan:   Active Problems:   Human immunodeficiency virus (HIV) disease ([place])   Diabetes mellitus type 2 with complications, uncontrolled ([place])   Mood disorder ([place])   Essential hypertension   COPD with acute bronchitis ([place])   Tobacco abuse   Repeated falls   Diabetic neuropathy ([place])   Obesity hypoventilation syndrome ([place])   Hyperlipidemia   CKD (chronic kidney disease), stage III ([name])   Syncope   Syncope: Details surrounding the event not clear.  Etiology hence difficult to determine.  DD: Vasovagal, arrhythmias, dehydration with orthostatic changes.  CT head and cervical spine without acute findings.  Unable to perform orthostatic vital signs due to mental status changes and BKA.  EKG on admission showed A. fib, repeat EKG showed atrial flutter but cardiology indicated that it was an artifact.  TSH normal.  TTE 8/30: Difficult study.  LV systolic function grossly  normal.  Telemetry personally reviewed and showed sinus rhythm.  PT recommended SNF, patient initially refused and then agreeable.  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  Acute encephalopathy: Unclear etiology.  CT head without acute findings.  MRI requested and pending.  Could be due to polypharmacy: Patient on Abilify 15 mg daily, doxepin 10 mg at bedtime, PRN Klonopin, PRN IV morphine and Restoril 15 mg at bedtime as needed.  Denied alcohol use.  Blood alcohol level on this and recent admission negative.  Reduced Restoril to 7.5 mg at bedtime on 9/4.  Mental status seems improved this morning, more alert, interactive.  Continue to monitor closely.  RN discussed with radiology who will attempt to do MRI later today.  Ammonia normal.  COPD/asthma exacerbation: Completed 3 days course of oral azithromycin.  Also completed brief course of IV Solu-Medrol.  Bronchospasm clinically resolved.  Dysphagia: Reported coughing with eating.  Speech therapy followed up and recommended continued dysphagia 3 diet.  Leukocytosis: No clinical focus of infection.  Despite being off of steroids for couple days, persisting and increasing leukocytosis of unclear etiology.  No diarrhea reported.  No symptoms to suggest infectious source.  Follow CBC with differential in a.m.  May need further work-up if keeps persistently rising.  Hypokalemia: Replace aggressively and follow.  Magnesium 2.2.  Acute on stage III chronic kidney disease: Baseline creatinine not clear, has been fluctuating all year.  Had normal creatinine in March and 1.1 in late July.  Discharged 8/26 with creatinine 1.27 and presented with creatinine of 2.04 which had improved to 1.1 but has gradually increased to 1.55.  Unclear etiology.?  Volume depletion.  Currently not on ACEI/ARB or diuretics.  Has Foley catheter so no retention.  May be due to Lasix, held Lasix 80 mg daily for now.  Creatinine has slightly improved to 1.33.  Follow BMP in a.m.  Dehydration  with hypernatremia: Improved.  Hypernatremia resolved.  Rhabdomyolysis: Secondary to fall and prolonged immobilization on floor PTA.  Resolved.  Abnormal LFTs: Mild.  Suspect due to mild rhabdomyolysis.  Improved..  No GI symptoms reported.  Poorly controlled DM 2: Issues with marked hyper glycemia and hypoglycemia.  Discussed in detail with diabetes coordinator.  Discontinued U500, started Lantus 60 units daily, NovoLog sensitive SSI, NovoLog 5 units 3 times daily with meals.  CBGs better.  Had hypoglycemic episode of 65 with labs this morning.  Increasing mealtime NovoLog slightly.  Chronic diastolic CHF: Compensated.  HIV: Continue ART.  Tobacco abuse: Cessation counseled.  Nicotine patch.  Anxiety and depression: Management as indicated above.   DVT prophylaxis: Subcutaneous heparin Code Status: Full Family Communication: Discussed with son in detail, updated care and answered questions. Disposition: DC to SNF when medically improved.  Not medically ready for discharge yet.   Consultants:  None  Procedures:  Foley catheter-discontinued 9/5.  Antimicrobials:  Azithromycin  Subjective: Patient appeared more alert this morning.  Oriented to person and place.  States that she is in the hospital because she had difficulty breathing.  Denied complaints.  No chest pain, dyspnea.  Patient seen along with speech therapy.  ROS: As above, otherwise unable due to mental status changes.  Objective:  Vitals:   09/05/17 0718 09/05/17 0815 09/05/17 1145 09/05/17 1600  BP:  (!) 164/75 139/83 (!) 165/124  Pulse: 92 84 97 (!) 109  Resp: 16 (!) 22 (!) 26 12  Temp:  (!) 96.7 F (35.9 C) 97.7 F (36.5 C) 97.9 F (36.6 C)  TempSrc:  Axillary Oral Oral  SpO2: 94% (!) 88% 92% 93%  Weight:      Height:        Examination:  General exam: Young female, moderately built and obese, lying comfortably propped up in bed without distress. Respiratory system: Clear to auscultation/poor  inspiratory effort. Respiratory effort normal. Cardiovascular system: S1 & S2 heard, RRR. No JVD, murmurs, rubs, gallops or clicks. No pedal edema.  Telemetry personally reviewed: Sinus rhythm. Gastrointestinal system: Abdomen is nondistended, soft and nontender. No organomegaly or masses felt. Normal bowel sounds heard.  Stable. Central nervous system: Mental status as indicated above, better today compared to yesterday.  Follows instructions. No focal neurological deficits. Extremities: Symmetric 5 x 5 power.  Left BKA.  Upper extremity with some bruises but no active bleeding. Skin: No rashes, lesions or ulcers Psychiatry: Judgement and insight impaired. Mood & affect cannot be assessed at this time.     Data Reviewed: I have personally reviewed following labs and imaging studies  CBC: Recent Labs  Lab 08/30/17 0558 09/01/17 0232 09/02/17 1254 09/05/17 0322  WBC 14.1* 22.4* 22.5* 25.1*  HGB 12.6 12.6 13.8 13.6  HCT 39.2 38.9 42.7 43.5  MCV 88.7 87.8 87.3 89.7  PLT 221 259 327 672   Basic Metabolic Panel: Recent Labs  Lab 09/01/17 0232 09/02/17 1254 09/03/17 1528 09/04/17 0317 09/05/17 0322  NA 140 141 144 145 140  K 4.1 3.2* 3.0* 2.9* 3.6  CL 104 96* 97* 98 99  CO2 24 33* 33* 34* 29  GLUCOSE 292* 434* 158* 55* 68*  BUN 29* 29* 31* 32* 26*  CREATININE 1.11* 1.38* 1.29*  1.55* 1.31*  CALCIUM 8.9 8.9 9.3 9.2 8.7*  MG  --   --   --  2.2  --    Liver Function Tests: Recent Labs  Lab 09/04/17 1724  AST 25  ALT 33  ALKPHOS 82  BILITOT 0.6  PROT 5.8*  ALBUMIN 2.6*   Cardiac Enzymes: Recent Labs  Lab 08/30/17 0558 08/30/17 0958 08/30/17 1430 08/30/17 2119 09/04/17 1724  CKTOTAL 235*  --   --   --  63  TROPONINI  --  0.04* 0.04* <0.03  --    CBG: Recent Labs  Lab 09/04/17 1728 09/04/17 2057 09/05/17 0636 09/05/17 0817 09/05/17 1239  GLUCAP 233* 123* 98 122* 279*    Recent Results (from the past 240 hour(s))  MRSA PCR Screening     Status: None    Collection Time: 08/29/17  6:24 PM  Result Value Ref Range Status   MRSA by PCR NEGATIVE NEGATIVE Final    Comment:        The GeneXpert MRSA Assay (FDA approved for NASAL specimens only), is one component of a comprehensive MRSA colonization surveillance program. It is not intended to diagnose MRSA infection nor to guide or monitor treatment for MRSA infections. Performed at [facility], [place] [address], [place], [place] [address]          Radiology Studies: No results found.      Scheduled Meds: . ARIPiprazole  15 mg Oral Daily  . doxepin  10 mg Oral QHS  . elvitegravir-cobicistat-emtricitabine-tenofovir  1 tablet Oral Q breakfast  . fenofibrate  160 mg Oral Daily  . fluticasone furoate-vilanterol  1 puff Inhalation Daily  . furosemide  80 mg Oral Daily  . heparin  5,000 Units Subcutaneous Q8H  . insulin aspart  0-5 Units Subcutaneous QHS  . insulin aspart  0-9 Units Subcutaneous TID WC  . insulin aspart  7 Units Subcutaneous TID WC  . insulin glargine  60 Units Subcutaneous Daily  . magnesium oxide  400 mg Oral Daily  . mouth rinse  15 mL Mouth Rinse BID  . multivitamin with minerals  1 tablet Oral Daily  . nicotine  21 mg Transdermal Daily  . pantoprazole  40 mg Oral Daily  . polyethylene glycol  17 g Oral Daily  . rosuvastatin  20 mg Oral Daily  . senna-docusate  2 tablet Oral BID  . sodium chloride flush  3 mL Intravenous Q12H   Continuous Infusions: . sodium chloride 10 mL/hr at 08/31/17 1600     LOS: 7 days     Vernell Leep, MD, [place], [facility]. [facility] Pager [phone]  If 7PM-7AM, please contact night-coverage [email or web address] Password [facility] 09/05/2017, 4:23 PM

## 2017-09-06 DIAGNOSIS — G9340 Encephalopathy, unspecified: Secondary | ICD-10-CM

## 2017-09-06 LAB — BASIC METABOLIC PANEL
Anion gap: 16 — ABNORMAL HIGH (ref 5–15)
BUN: 22 mg/dL — ABNORMAL HIGH (ref 6–20)
CALCIUM: 9.3 mg/dL (ref 8.9–10.3)
CO2: 31 mmol/L (ref 22–32)
CREATININE: 1.59 mg/dL — AB (ref 0.44–1.00)
Chloride: 95 mmol/L — ABNORMAL LOW (ref 98–111)
GFR calc Af Amer: 42 mL/min — ABNORMAL LOW (ref >60)
GFR calc non Af Amer: 36 mL/min — ABNORMAL LOW (ref >60)
Glucose, Bld: 185 mg/dL — ABNORMAL HIGH (ref 70–99)
Potassium: 3.1 mmol/L — ABNORMAL LOW (ref 3.5–5.1)
SODIUM: 142 mmol/L (ref 135–145)

## 2017-09-06 LAB — GLUCOSE, CAPILLARY
GLUCOSE-CAPILLARY: 374 mg/dL — AB (ref 70–99)
GLUCOSE-CAPILLARY: 363 mg/dL — AB (ref 70–99)
Glucose-Capillary: 284 mg/dL — ABNORMAL HIGH (ref 70–99)
Glucose-Capillary: 184 mg/dL — ABNORMAL HIGH (ref 70–99)

## 2017-09-06 LAB — CBC WITH DIFFERENTIAL/PLATELET
BASOS PCT: 0 %
Basophils Absolute: 0 10*3/uL (ref 0.0–0.1)
EOS ABS: 0.3 10*3/uL (ref 0.0–0.7)
Eosinophils Relative: 1 %
HCT: 46 % (ref 36.0–46.0)
Hemoglobin: 14.5 g/dL (ref 12.0–15.0)
LYMPHS ABS: 2.8 10*3/uL (ref 0.7–4.0)
Lymphocytes Relative: 11 %
MCH: 28 pg (ref 26.0–34.0)
MCHC: 31.5 g/dL (ref 30.0–36.0)
MCV: 89 fL (ref 78.0–100.0)
MONO ABS: 1.3 10*3/uL — AB (ref 0.1–1.0)
Monocytes Relative: 5 %
Neutro Abs: 21.5 10*3/uL — ABNORMAL HIGH (ref 1.7–7.7)
Neutrophils Relative %: 83 %
PLATELETS: 379 10*3/uL (ref 150–400)
RBC: 5.17 MIL/uL — ABNORMAL HIGH (ref 3.87–5.11)
RDW: 19.1 % — AB (ref 11.5–15.5)
WBC: 25.9 10*3/uL — ABNORMAL HIGH (ref 4.0–10.5)

## 2017-09-06 MED ORDER — POTASSIUM CHLORIDE CRYS ER 20 MEQ PO TBCR
40.0000 meq | EXTENDED_RELEASE_TABLET | ORAL | Status: AC
  Administered 2017-09-06 (×2): 40 meq via ORAL
  Filled 2017-09-06 (×2): qty 2

## 2017-09-06 MED ORDER — INSULIN GLARGINE 100 UNIT/ML ~~LOC~~ SOLN
5.0000 [IU] | Freq: Once | SUBCUTANEOUS | Status: AC
  Administered 2017-09-06: 5 [IU] via SUBCUTANEOUS
  Filled 2017-09-06 (×2): qty 0.05

## 2017-09-06 MED ORDER — INSULIN GLARGINE 100 UNIT/ML ~~LOC~~ SOLN
65.0000 [IU] | Freq: Every day | SUBCUTANEOUS | Status: DC
  Administered 2017-09-07: 65 [IU] via SUBCUTANEOUS
  Filled 2017-09-06 (×2): qty 0.65

## 2017-09-06 MED ORDER — INSULIN ASPART 100 UNIT/ML ~~LOC~~ SOLN
12.0000 [IU] | Freq: Three times a day (TID) | SUBCUTANEOUS | Status: DC
  Administered 2017-09-06 – 2017-09-09 (×10): 12 [IU] via SUBCUTANEOUS

## 2017-09-06 MED ORDER — PHENAZOPYRIDINE HCL 100 MG PO TABS
100.0000 mg | ORAL_TABLET | Freq: Three times a day (TID) | ORAL | Status: DC
  Filled 2017-09-06: qty 1

## 2017-09-06 MED ORDER — BICTEGRAVIR-EMTRICITAB-TENOFOV 50-200-25 MG PO TABS
1.0000 | ORAL_TABLET | Freq: Every day | ORAL | Status: DC
  Administered 2017-09-06 – 2017-09-09 (×4): 1 via ORAL
  Filled 2017-09-06 (×4): qty 1

## 2017-09-06 MED ORDER — PHENAZOPYRIDINE HCL 100 MG PO TABS
100.0000 mg | ORAL_TABLET | Freq: Three times a day (TID) | ORAL | Status: AC
  Administered 2017-09-06: 100 mg via ORAL
  Filled 2017-09-06: qty 1

## 2017-09-06 NOTE — Progress Notes (Addendum)
Inpatient Diabetes Program Recommendations  AACE/ADA: New Consensus Statement on Inpatient Glycemic Control (2019)  Target Ranges:  Prepandial:   less than 140 mg/dL      Peak postprandial:   less than 180 mg/dL (1-2 hours)      Critically ill patients:  140 - 180 mg/dL   Results for NICLE, CONNOLE (MRN [id number]) as of 09/06/2017 10:07  Ref. Range 09/05/2017 08:17 09/05/2017 12:39 09/05/2017 16:54 09/05/2017 20:46 09/06/2017 08:03  Glucose-Capillary Latest Ref Range: 70 - 99 mg/dL 122 (H) 279 (H) 190 (H) 214 (H) 374 (H)   Review of Glycemic Control  Diabetes history: DM2 Outpatient Diabetes medications: Toujeo 60 units BID, Humulin R U500 60 units with breakfast, 50 units with lunch, 35 units with supper, Victoza 1.8 mg daily Current orders for Inpatient glycemic control: Lantus 60 units daily, Novolog 0-9 units TID with meals, Novolog 0-5 units QHS, Novolog 7 units TID with meals  Inpatient Diabetes Program Recommendations:  Insulin - Basal: Please consider increasing Lantus to 65 units daily. Insulin - Meal Coverage: Please consider increasing Novolog 12 units TID with meals for meal coverage.    Thanks, Barnie Alderman, RN, MSN, CDE Diabetes Coordinator Inpatient Diabetes Program [phone] (Team Pager from 8am to 5pm)

## 2017-09-06 NOTE — Progress Notes (Addendum)
PROGRESS NOTE   [name]  QQV:[id number]    DOB: 14-Apr-1965    DOA: 08/29/2017  PCP: [name], PA-C   I have briefly reviewed patients previous medical records in [facility].  Brief Narrative:  52 year old female with PMH of asthma/COPD, stage IV chronic kidney disease, morbid obesity, anxiety and depression, type II DM/IDDM on high doses of insulins, HLD, HTN, GERD, peripheral neuropathy, HIV, OHS, chronic diastolic CHF, left BKA uses prosthesis and chronic pain syndrome, recent hospitalization 08/26/2017-08/27/2017 for COPD exacerbation, apparently living at home by herself, returned to ED on 08/29/2017 after reportedly experiencing a syncopal episode at home, fell and was unable to get up from the floor and stayed on the floor for 24 to 48 hours until she was found by her daughter-in-law who brought her to the hospital.  She was admitted for evaluation and management of syncope, acute on chronic kidney disease complicated by hypernatremia, hyperkalemia, anion gap metabolic acidosis.  She had mental status changes which have resolved.  Hospital course complicated by leukocytosis of unclear etiology and uncontrolled DM, diabetes coordinator assisting.   Assessment & Plan:   Active Problems:   Human immunodeficiency virus (HIV) disease ([place])   Diabetes mellitus type 2 with complications, uncontrolled ([place])   Mood disorder ([place])   Essential hypertension   COPD with acute bronchitis ([place])   Tobacco abuse   Repeated falls   Diabetic neuropathy ([place])   Obesity hypoventilation syndrome ([place])   Hyperlipidemia   CKD (chronic kidney disease), stage III ([name])   Syncope   Syncope: Details surrounding the event not clear.  Etiology hence difficult to determine.  DD: Vasovagal, arrhythmias, dehydration with orthostatic changes.  CT head and cervical spine without acute findings.  Unable to perform orthostatic vital signs due to mental status changes and BKA.  EKG on admission  showed A. fib, repeat EKG showed atrial flutter but cardiology indicated that it was an artifact.  TSH normal.  TTE 8/30: Difficult study.  LV systolic function grossly normal.  Telemetry personally reviewed and showed sinus rhythm.  PT recommended SNF, patient initially refused and then agreeable.  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  Acute encephalopathy: Unclear etiology.  CT head without acute findings.  MRI requested and pending.  Could be due to polypharmacy: Patient on Abilify 15 mg daily, doxepin 10 mg at bedtime, PRN Klonopin, PRN IV morphine and Restoril 15 mg at bedtime as needed.  Denied alcohol use.  Blood alcohol level on this and recent admission negative.  Reduced Restoril to 7.5 mg at bedtime on 9/4.  MRI brain: Limited study but no acute findings.  Ammonia level normal.  Mental status changes resolved 9/6.  Some concern for substance abuse as per history from spouse 9/5.  She apparently lives at home with a cousin who provides her with alcohol and prescription medications purchased on the street.  Continue to monitor.  COPD/asthma exacerbation: Completed 3 days course of oral azithromycin.  Also completed brief course of IV Solu-Medrol.  Bronchospasm clinically resolved.  Dysphagia: Reported coughing with eating.  Speech therapy followed up and recommended continued dysphagia 3 diet.  Leukocytosis: Neutrophilic leukocytosis, progressively worsening.  Patient reportedly had 4-5 episodes of loose, not watery diarrhea yesterday (MD not informed) but no abdominal pain, nausea or vomiting.  Detailed ROS and physical exam 9/6 not revealing for cause.  Perplexing.  Follow CBC in a.m. and consider discussing or consulting ID.  Patient otherwise looks well and not septic.??  C. difficile testing  if has recurrent diarrhea.  No BM since morning.  Hypokalemia: Replace aggressively and follow.  Magnesium 2.2.  Acute on stage III chronic kidney disease: Baseline creatinine not clear, has been fluctuating  all year.  Had normal creatinine in March and 1.1 in late July.  Discharged 8/26 with creatinine 1.27 and presented with creatinine of 2.04 which had improved to 1.1 but has gradually increased to 1.55.  Unclear etiology.?  Volume depletion.  Currently not on ACEI/ARB or diuretics.  Creatinine had improved to 1.3 but has gone up to 1.59, may be related to Lasix and some GI losses from diarrhea yesterday.  Discontinue Lasix for now.  Follow BMP in a.m.  Bladder scan (Foley catheter removed yesterday).  Renal ultrasound 8/20: No obstructive uropathy.  Dehydration with hypernatremia: Resolved.  Rhabdomyolysis: Secondary to fall and prolonged immobilization on floor PTA.  Resolved.  Abnormal LFTs: Mild.  Suspect due to mild rhabdomyolysis.  Improved..  No GI symptoms reported.  Poorly controlled DM 2: Issues with marked hyper glycemia and hypoglycemia.  Discussing daily with diabetes coordinator.  Discontinued U500, started Lantus, NovoLog mealtime and SSI and adjusting doses as needed.  Chronic diastolic CHF: Compensated.  Discontinued Lasix 9/6 due to increasing creatinine.  HIV: Continue ART. D/W ID on 9/6 who stated that the current ART can spuriously cause increase creat and she will change ART to [facility].  Tobacco abuse: Cessation counseled.  Nicotine patch.  Anxiety and depression: Management as indicated above.  Patient reports that she sees psychiatry, [facility]. Junie [facility] at [facility].   DVT prophylaxis: Subcutaneous heparin Code Status: Full Family Communication: Discussed with son in detail on 9/5, updated care and answered questions.  As per son's report, patient lives at home but he does not think that she is safe to take care of herself at home, has refused SNF in the past.  He states that patient is living in his house.  He lives approximately an hour away.  He is concerned that 1 of the family members is providing patient with substances for abuse.  Left voicemail message  9/6. Disposition: On 9/5, patient threatened to leave AMA.  Subsequently her father was able to convince her to stay and now agreeable to go to SNF.  Social work involved.   Consultants:  None  Procedures:  Foley catheter-discontinued 9/5.  Antimicrobials:  Azithromycin  Subjective: Patient was interviewed and examined this morning along with her RN in room.  Patient is fully oriented and coherent.  She states that she lives alone at home, moves around with the help of a wheelchair and has a lower extremity prosthesis.  She apparently slid off the edge of the bed and fell down, apparently hit her head and passed out for an unknown duration.  She was unable to get up until her daughter-in-law found her a day or 2 later.  Volunteers to smoking but denies alcohol or substance abuse.  Denies any symptoms at this time suggestive of infectious source.  Did report diarrhea from yesterday but none today.  This morning patient was threatening to leave AMA.  ROS: As above, otherwise unable due to mental status changes.  Objective:  Vitals:   09/06/17 0845 09/06/17 0931 09/06/17 1157 09/06/17 1601  BP: (!) 126/111  100/83 (!) 116/104  Pulse:      Resp:   13 13  Temp:   97.8 F (36.6 C) 97.7 F (36.5 C)  TempSrc:   Oral Oral  SpO2:  90% 94%   Weight:  Height:        Examination:  General exam: Young female, moderately built and obese, lying comfortably propped up in bed without distress.  Looks much improved compared to the last 2 days. Respiratory system: Clear to auscultation/poor inspiratory effort. Respiratory effort normal.  Stable. Cardiovascular system: S1 & S2 heard, RRR. No JVD, murmurs, rubs, gallops or clicks. No pedal edema.  Telemetry personally reviewed: SR, occasional ST in the 100s. Gastrointestinal system: Abdomen is nondistended, soft and nontender. No organomegaly or masses felt. Normal bowel sounds heard.  Stable. Central nervous system: Alert and oriented x3.  No  focal neurological deficits. Extremities: Symmetric 5 x 5 power.  Left BKA.  Upper extremity with some bruises but no active bleeding. Skin: No rashes, lesions or ulcers Psychiatry: Judgement and insight intact. Mood & affect appropriate.  Patient has capacity to make medical decisions for herself.    Data Reviewed: I have personally reviewed following labs and imaging studies  CBC: Recent Labs  Lab 09/01/17 0232 09/02/17 1254 09/05/17 0322 09/06/17 0329  WBC 22.4* 22.5* 25.1* 25.9*  NEUTROABS  --   --   --  21.5*  HGB 12.6 13.8 13.6 14.5  HCT 38.9 42.7 43.5 46.0  MCV 87.8 87.3 89.7 89.0  PLT 259 327 321 093   Basic Metabolic Panel: Recent Labs  Lab 09/02/17 1254 09/03/17 1528 09/04/17 0317 09/05/17 0322 09/06/17 0329  NA 141 144 145 140 142  K 3.2* 3.0* 2.9* 3.6 3.1*  CL 96* 97* 98 99 95*  CO2 33* 33* 34* 29 31  GLUCOSE 434* 158* 55* 68* 185*  BUN 29* 31* 32* 26* 22*  CREATININE 1.38* 1.29* 1.55* 1.31* 1.59*  CALCIUM 8.9 9.3 9.2 8.7* 9.3  MG  --   --  2.2  --   --    Liver Function Tests: Recent Labs  Lab 09/04/17 1724  AST 25  ALT 33  ALKPHOS 82  BILITOT 0.6  PROT 5.8*  ALBUMIN 2.6*   Cardiac Enzymes: Recent Labs  Lab 08/30/17 2119 09/04/17 1724  CKTOTAL  --  36  TROPONINI <0.03  --    CBG: Recent Labs  Lab 09/05/17 1239 09/05/17 1654 09/05/17 2046 09/06/17 0803 09/06/17 1217  GLUCAP 279* 190* 214* 374* 363*    Recent Results (from the past 240 hour(s))  MRSA PCR Screening     Status: None   Collection Time: 08/29/17  6:24 PM  Result Value Ref Range Status   MRSA by PCR NEGATIVE NEGATIVE Final    Comment:        The GeneXpert MRSA Assay (FDA approved for NASAL specimens only), is one component of a comprehensive MRSA colonization surveillance program. It is not intended to diagnose MRSA infection nor to guide or monitor treatment for MRSA infections. Performed at [facility], [place] [address] [place], [place] [address]           Radiology Studies: Mr Brain Wo Contrast  Result Date: 09/05/2017 CLINICAL DATA:  Altered mental status. Syncopal episode. History of diabetes and HIV infection. EXAM: MRI HEAD WITHOUT CONTRAST TECHNIQUE: Multiplanar, multiecho pulse sequences of the brain and surrounding structures were obtained without intravenous contrast. COMPARISON:  Head CT 08/29/2017 FINDINGS: The examination had to be discontinued prior to completion due to patient pain. Axial diffusion, axial FLAIR, and susceptibility weighted imaging was performed. There is no restricted diffusion to indicate acute infarct. Susceptibility weighted imaging is moderately to severely motion degraded without gross hemorrhage identified. Mild cerebral atrophy  is advanced for age. Patchy cerebral white matter T2 hyperintensities are moderately advanced for age and nonspecific but compatible with chronic small vessel ischemic disease. No intracranial mass effect or extra-axial fluid collection is evident. IMPRESSION: 1. Incomplete examination. 2. No acute infarct. 3. Age advanced chronic small vessel ischemic disease and cerebral atrophy. Electronically Signed   By: Logan Bores M.D.   On: 09/05/2017 22:27        Scheduled Meds: . ARIPiprazole  15 mg Oral Daily  . doxepin  10 mg Oral QHS  . elvitegravir-cobicistat-emtricitabine-tenofovir  1 tablet Oral Q breakfast  . fenofibrate  160 mg Oral Daily  . fluticasone furoate-vilanterol  1 puff Inhalation Daily  . heparin  5,000 Units Subcutaneous Q8H  . insulin aspart  0-5 Units Subcutaneous QHS  . insulin aspart  0-9 Units Subcutaneous TID WC  . insulin aspart  12 Units Subcutaneous TID WC  . insulin glargine  5 Units Subcutaneous Once  . [START ON 09/07/2017] insulin glargine  65 Units Subcutaneous Daily  . magnesium oxide  400 mg Oral Daily  . mouth rinse  15 mL Mouth Rinse BID  . multivitamin with minerals  1 tablet Oral Daily  . nicotine  21 mg Transdermal Daily  . pantoprazole   40 mg Oral Daily  . polyethylene glycol  17 g Oral Daily  . rosuvastatin  20 mg Oral Daily  . senna-docusate  2 tablet Oral BID  . sodium chloride flush  3 mL Intravenous Q12H   Continuous Infusions: . sodium chloride 10 mL/hr at 08/31/17 1600     LOS: 8 days     Vernell Leep, MD, [place], [facility]. [facility] Pager [phone]  If 7PM-7AM, please contact night-coverage [email or web address] Password [email or web address] 09/06/2017, 4:13 PM

## 2017-09-06 NOTE — Clinical Social Work Note (Addendum)
Per MD, patient is fully oriented and has capacity today. CSW met with patient to discuss SNF placement. She is agreeable to bed at [facility] when stable. Authorization is still pending.  Dayton Scrape, [place]  3:56 pm Authorization still pending. SNF social worker will check with [employer] again in about 30 minutes.  Dayton Scrape, [place]

## 2017-09-06 NOTE — Progress Notes (Signed)
Pt has an extensive conversation ~15 min with Dr. Janeann Merl this morning. Pt wanted to leave AMA, and return home, she doesn't want to be in the hospital.  Dr. Janeann Merl explained to her that she's not medically stable to go home and need rehab/SNF.  Pt's father visited her at 9 to convince her to stay and go to rehab when medically stable. Pt now agreeing to stay and go to rehab after discharge.  Will continue to monitor pt.

## 2017-09-06 NOTE — Progress Notes (Signed)
Patient is experiencing burning with urination, difficulty starting stream, and frequent urination.  Urine is malodorous.  MD notified.  Orders received.

## 2017-09-07 DIAGNOSIS — D72829 Elevated white blood cell count, unspecified: Secondary | ICD-10-CM

## 2017-09-07 DIAGNOSIS — N3000 Acute cystitis without hematuria: Secondary | ICD-10-CM

## 2017-09-07 LAB — BASIC METABOLIC PANEL
Anion gap: 13 (ref 5–15)
BUN: 19 mg/dL (ref 6–20)
CHLORIDE: 95 mmol/L — AB (ref 98–111)
CO2: 27 mmol/L (ref 22–32)
CREATININE: 1.82 mg/dL — AB (ref 0.44–1.00)
Calcium: 8.8 mg/dL — ABNORMAL LOW (ref 8.9–10.3)
GFR calc Af Amer: 35 mL/min — ABNORMAL LOW (ref >60)
GFR calc non Af Amer: 31 mL/min — ABNORMAL LOW (ref >60)
GLUCOSE: 245 mg/dL — AB (ref 70–99)
Potassium: 3.9 mmol/L (ref 3.5–5.1)
Sodium: 135 mmol/L (ref 135–145)

## 2017-09-07 LAB — CBC WITH DIFFERENTIAL/PLATELET
BASOS PCT: 0 %
Basophils Absolute: 0 10*3/uL (ref 0.0–0.1)
EOS PCT: 1 %
Eosinophils Absolute: 0.3 10*3/uL (ref 0.0–0.7)
HEMATOCRIT: 40.1 % (ref 36.0–46.0)
HEMOGLOBIN: 12.8 g/dL (ref 12.0–15.0)
LYMPHS ABS: 2.5 10*3/uL (ref 0.7–4.0)
LYMPHS PCT: 10 %
MCH: 28.4 pg (ref 26.0–34.0)
MCHC: 31.9 g/dL (ref 30.0–36.0)
MCV: 88.9 fL (ref 78.0–100.0)
MONOS PCT: 6 %
Monocytes Absolute: 1.5 10*3/uL — ABNORMAL HIGH (ref 0.1–1.0)
NEUTROS ABS: 21.1 10*3/uL — AB (ref 1.7–7.7)
Neutrophils Relative %: 83 %
Platelets: 358 10*3/uL (ref 150–400)
RBC: 4.51 MIL/uL (ref 3.87–5.11)
RDW: 18.4 % — ABNORMAL HIGH (ref 11.5–15.5)
WBC: 25.4 10*3/uL — ABNORMAL HIGH (ref 4.0–10.5)

## 2017-09-07 LAB — GLUCOSE, CAPILLARY
GLUCOSE-CAPILLARY: 279 mg/dL — AB (ref 70–99)
Glucose-Capillary: 330 mg/dL — ABNORMAL HIGH (ref 70–99)
Glucose-Capillary: 285 mg/dL — ABNORMAL HIGH (ref 70–99)
Glucose-Capillary: 211 mg/dL — ABNORMAL HIGH (ref 70–99)

## 2017-09-07 MED ORDER — SODIUM CHLORIDE 0.9 % IV SOLN
1.0000 g | INTRAVENOUS | Status: DC
  Administered 2017-09-07 – 2017-09-09 (×3): 1 g via INTRAVENOUS
  Filled 2017-09-07 (×4): qty 10

## 2017-09-07 MED ORDER — DICLOFENAC SODIUM 1 % TD GEL
4.0000 g | Freq: Four times a day (QID) | TRANSDERMAL | Status: DC
  Administered 2017-09-07 – 2017-09-09 (×6): 4 g via TOPICAL
  Filled 2017-09-07 (×2): qty 100

## 2017-09-07 MED ORDER — PHENAZOPYRIDINE HCL 100 MG PO TABS
100.0000 mg | ORAL_TABLET | Freq: Two times a day (BID) | ORAL | Status: AC
  Administered 2017-09-07 (×2): 100 mg via ORAL
  Filled 2017-09-07 (×2): qty 1

## 2017-09-07 MED ORDER — INSULIN GLARGINE 100 UNIT/ML ~~LOC~~ SOLN
15.0000 [IU] | Freq: Once | SUBCUTANEOUS | Status: AC
  Administered 2017-09-07: 15 [IU] via SUBCUTANEOUS
  Filled 2017-09-07: qty 0.15

## 2017-09-07 NOTE — Progress Notes (Signed)
(  Late Entry) Shift Summary: Patient was progressively more confused throughout the shift. At the beginning of the shift she was disoriented to time only. By late in the shift she was oriented to person only. She repeatedly told staff that she "needs to go to the hospital." When staff attempted to reorient her, she would insist that we were not listening to her, and when asked what we were not listening to, she would respond with a new complaint that she had not previously reported to any staff member. These new complaints changed with each interaction. She was also frequently incontinent of non-liquid stool (6-8 times this shift), and uncooperative with staff attempting to aid her in using the bed pan.

## 2017-09-07 NOTE — Clinical Social Work Note (Signed)
[facility] got [place] yesterday afternoon, good for 72 hours.  [place], [place]

## 2017-09-07 NOTE — Progress Notes (Signed)
PROGRESS NOTE   [name]  WLS:[id number]    DOB: 07-May-1965    DOA: 08/29/2017  PCP: [name], PA-C   I have briefly reviewed patients previous medical records in [facility].  Brief Narrative:  52 year old female with PMH of asthma/COPD, stage IV chronic kidney disease, morbid obesity, anxiety and depression, type II DM/IDDM on high doses of insulins, HLD, HTN, GERD, peripheral neuropathy, HIV, OHS, chronic diastolic CHF, left BKA uses prosthesis and chronic pain syndrome, recent hospitalization 08/26/2017-08/27/2017 for COPD exacerbation, apparently living at home by herself, returned to ED on 08/29/2017 after reportedly experiencing a syncopal episode at home, fell and was unable to get up from the floor and stayed on the floor for 24 to 48 hours until she was found by her daughter-in-law who brought her to the hospital.  She was admitted for evaluation and management of syncope, acute on chronic kidney disease complicated by hypernatremia, hyperkalemia, anion gap metabolic acidosis.  She had mental status changes which have resolved.  Hospital course complicated by leukocytosis of unclear etiology and uncontrolled DM, diabetes coordinator assisting.   Assessment & Plan:   Active Problems:   Human immunodeficiency virus (HIV) disease ([name])   Diabetes mellitus type 2 with complications, uncontrolled ([place])   Mood disorder ([place])   Essential hypertension   COPD with acute bronchitis ([place])   Tobacco abuse   Repeated falls   Diabetic neuropathy ([place])   Obesity hypoventilation syndrome ([place])   Hyperlipidemia   CKD (chronic kidney disease), stage III ([name])   Syncope   Syncope: Details surrounding the event not clear.  Etiology hence difficult to determine.  DD: Vasovagal, arrhythmias, dehydration with orthostatic changes.  CT head and cervical spine without acute findings.  Unable to perform orthostatic vital signs due to mental status changes and BKA.  EKG on admission  showed A. fib, repeat EKG showed atrial flutter but cardiology indicated that it was an artifact.  TSH normal.  TTE 8/30: Difficult study.  LV systolic function grossly normal.  Telemetry personally reviewed and showed sinus rhythm.  PT recommended SNF, patient initially refused and then agreeable.  Patient has a SNF bed when medically ready.  Acute encephalopathy: Unclear etiology.  CT head without acute findings.  MRI requested and pending.  Could be due to polypharmacy: Patient on Abilify 15 mg daily, doxepin 10 mg at bedtime, PRN Klonopin, PRN IV morphine and Restoril 15 mg at bedtime as needed.  Denied alcohol use.  Blood alcohol level on this and recent admission negative.  Reduced Restoril to 7.5 mg at bedtime on 9/4.  MRI brain: Limited study but no acute findings.  Ammonia level normal.  Mental status changes resolved 9/6.  Some concern for substance abuse as per history from spouse 9/5.  She apparently lives at home with a cousin who provides her with alcohol and prescription medications purchased on the street.  Continue to monitor.  Resolved and stable.  COPD/asthma exacerbation: Completed 3 days course of oral azithromycin.  Also completed brief course of IV Solu-Medrol.  Bronchospasm clinically resolved.  Dysphagia: Reported coughing with eating.  Speech therapy followed up and recommended continued dysphagia 3 diet.  Leukocytosis: Neutrophilic leukocytosis, progressively worsening.  Patient reportedly had 4-5 episodes of loose, not watery diarrhea yesterday (MD not informed) but no abdominal pain, nausea or vomiting.  Detailed ROS and physical exam 9/6 not revealing for cause.  I discussed with an infectious disease MD on 9/6 and in the absence of any clear focus  of infection by signs and symptoms, was advised to continue to monitor closely, follow CBCs.  On 9/7, patient for the first time indicated dysuria, difficulty urinating and nurses have documented malodorous urine.  Patient reported  fever and chills but is afebrile here.  Persistent leukocytosis in the 25K range for the last 3 days?  Related to UTI.  Patient having formed stools so no concern for C. difficile currently.  Initiated IV ceftriaxone.  Follow CBC daily.  Acute lower UTI/cystitis: History as noted above.  Urine culture sent before starting IV ceftriaxone 1 g daily.  Hypokalemia: Replaced..  Magnesium 2.2.  Acute on stage III chronic kidney disease: Baseline creatinine not clear, has been fluctuating all year.  Had normal creatinine in March and 1.1 in late July.  Discharged 8/26 with creatinine 1.27 and presented with creatinine of 2.04 which had improved to 1.1 but has gradually increased to 1.55.  Unclear etiology.?  Volume depletion.  Currently not on ACEI/ARB or diuretics. Renal ultrasound 8/20: No obstructive uropathy.  Creatinine has increased to 1.8.  Lasix was stopped yesterday.  Clinically appears euvolemic.  As discussed with ID MD on 9/6, Genvoya the cause elevated creatinine and hence this was discontinued and Biktarvy started.  Encouraged increased oral fluids.  Follow BMP.  Dehydration with hypernatremia: Resolved.  Rhabdomyolysis: Secondary to fall and prolonged immobilization on floor PTA.  Resolved.  Abnormal LFTs: Mild.  Suspect due to mild rhabdomyolysis.  Improved..  No GI symptoms reported.  Poorly controlled DM 2: Issues with marked hyper glycemia and hypoglycemia.  Discussing daily with diabetes coordinator.  Discontinued U500, started Lantus, NovoLog mealtime and SSI and adjusting doses as needed.  On 9/7, patient received Lantus 80 units, continued NovoLog 12 units 3 times daily with meals and sensitive SSI.  Diabetes coordinator follow-up appreciated.  Chronic diastolic CHF: Compensated.  Discontinued Lasix 9/6 due to increasing creatinine.  HIV: Continue ART. D/W ID on 9/6 who stated that the [facility] can spuriously cause increase creat and this was changed to [place].  Tobacco abuse:  Cessation counseled.  Nicotine patch.  Anxiety and depression: Management as indicated above.  Patient reports that she sees psychiatry, Dr. Junie Bame at [facility].   DVT prophylaxis: Subcutaneous heparin Code Status: Full Family Communication: No family at bedside. Disposition: DC to SNF pending clinical improvement, improvement in urinary symptoms, leukocytosis.   Consultants:  None  Procedures:  Foley catheter-discontinued 9/5.  Antimicrobials:  Azithromycin  Subjective: No diarrhea.  Indicates difficulty urinating and dysuria.  Nurses report malodorous urine and that patient had a large formed BM today.  Patient also wishes to know why her neuropathy medications were stopped and if they can be resumed.  ROS: As above, otherwise unable due to mental status changes.  Objective:  Vitals:   09/07/17 0413 09/07/17 0758 09/07/17 0952 09/07/17 1120  BP: 134/79 (!) 135/120  132/76  Pulse: 86 72    Resp: 12 12  10   Temp: 97.9 F (36.6 C) 97.6 F (36.4 C)  97.7 F (36.5 C)  TempSrc: Oral Oral  Oral  SpO2: 97% 96% 98% 98%  Weight:      Height:        Examination: Patient was interviewed and examined along with RN. General exam: Young female, moderately built and obese, lying comfortably propped up in bed without distress.  Stable Respiratory system: Clear to auscultation/poor inspiratory effort. Respiratory effort normal.  Stable. Cardiovascular system: S1 & S2 heard, RRR. No JVD, murmurs, rubs, gallops or clicks.  No pedal edema.  Stable Gastrointestinal system: Abdomen is nondistended, soft and nontender. No organomegaly or masses felt. Normal bowel sounds heard.  Stable. Central nervous system: Alert and oriented x3.  No focal neurological deficits. Extremities: Symmetric 5 x 5 power.  Left BKA.  Upper extremity with some bruises but no active bleeding. Skin: No rashes, lesions or ulcers Psychiatry: Judgement and insight intact. Mood & affect appropriate.  Patient has  capacity to make medical decisions for herself.    Data Reviewed: I have personally reviewed following labs and imaging studies  CBC: Recent Labs  Lab 09/01/17 0232 09/02/17 1254 09/05/17 0322 09/06/17 0329 09/07/17 0143  WBC 22.4* 22.5* 25.1* 25.9* 25.4*  NEUTROABS  --   --   --  21.5* 21.1*  HGB 12.6 13.8 13.6 14.5 12.8  HCT 38.9 42.7 43.5 46.0 40.1  MCV 87.8 87.3 89.7 89.0 88.9  PLT 259 327 321 379 323   Basic Metabolic Panel: Recent Labs  Lab 09/03/17 1528 09/04/17 0317 09/05/17 0322 09/06/17 0329 09/07/17 0143  NA 144 145 140 142 135  K 3.0* 2.9* 3.6 3.1* 3.9  CL 97* 98 99 95* 95*  CO2 33* 34* 29 31 27   GLUCOSE 158* 55* 68* 185* 245*  BUN 31* 32* 26* 22* 19  CREATININE 1.29* 1.55* 1.31* 1.59* 1.82*  CALCIUM 9.3 9.2 8.7* 9.3 8.8*  MG  --  2.2  --   --   --    Liver Function Tests: Recent Labs  Lab 09/04/17 1724  AST 25  ALT 33  ALKPHOS 82  BILITOT 0.6  PROT 5.8*  ALBUMIN 2.6*   Cardiac Enzymes: Recent Labs  Lab 09/04/17 1724  CKTOTAL 63   CBG: Recent Labs  Lab 09/06/17 1217 09/06/17 1718 09/06/17 2132 09/07/17 0756 09/07/17 1118  GLUCAP 363* 284* 184* 330* 285*    Recent Results (from the past 240 hour(s))  MRSA PCR Screening     Status: None   Collection Time: 08/29/17  6:24 PM  Result Value Ref Range Status   MRSA by PCR NEGATIVE NEGATIVE Final    Comment:        The GeneXpert MRSA Assay (FDA approved for NASAL specimens only), is one component of a comprehensive MRSA colonization surveillance program. It is not intended to diagnose MRSA infection nor to guide or monitor treatment for MRSA infections. Performed at [facility], [place] [address], [place], [place] [address]          Radiology Studies: Mr Brain Wo Contrast  Result Date: 09/05/2017 CLINICAL DATA:  Altered mental status. Syncopal episode. History of diabetes and HIV infection. EXAM: MRI HEAD WITHOUT CONTRAST TECHNIQUE: Multiplanar, multiecho pulse  sequences of the brain and surrounding structures were obtained without intravenous contrast. COMPARISON:  Head CT 08/29/2017 FINDINGS: The examination had to be discontinued prior to completion due to patient pain. Axial diffusion, axial FLAIR, and susceptibility weighted imaging was performed. There is no restricted diffusion to indicate acute infarct. Susceptibility weighted imaging is moderately to severely motion degraded without gross hemorrhage identified. Mild cerebral atrophy is advanced for age. Patchy cerebral white matter T2 hyperintensities are moderately advanced for age and nonspecific but compatible with chronic small vessel ischemic disease. No intracranial mass effect or extra-axial fluid collection is evident. IMPRESSION: 1. Incomplete examination. 2. No acute infarct. 3. Age advanced chronic small vessel ischemic disease and cerebral atrophy. Electronically Signed   By: Logan Bores M.D.   On: 09/05/2017 22:27  Scheduled Meds: . ARIPiprazole  15 mg Oral Daily  . bictegravir-emtricitabine-tenofovir AF  1 tablet Oral Daily  . doxepin  10 mg Oral QHS  . fenofibrate  160 mg Oral Daily  . fluticasone furoate-vilanterol  1 puff Inhalation Daily  . heparin  5,000 Units Subcutaneous Q8H  . insulin aspart  0-5 Units Subcutaneous QHS  . insulin aspart  0-9 Units Subcutaneous TID WC  . insulin aspart  12 Units Subcutaneous TID WC  . insulin glargine  15 Units Subcutaneous Once  . insulin glargine  65 Units Subcutaneous Daily  . magnesium oxide  400 mg Oral Daily  . mouth rinse  15 mL Mouth Rinse BID  . multivitamin with minerals  1 tablet Oral Daily  . nicotine  21 mg Transdermal Daily  . pantoprazole  40 mg Oral Daily  . phenazopyridine  100 mg Oral BID  . polyethylene glycol  17 g Oral Daily  . rosuvastatin  20 mg Oral Daily  . senna-docusate  2 tablet Oral BID  . sodium chloride flush  3 mL Intravenous Q12H   Continuous Infusions: . sodium chloride 10 mL/hr at  08/31/17 1600  . cefTRIAXone (ROCEPHIN)  IV       LOS: 9 days     Vernell Leep, MD, FACP, [facility]. [facility] Pager [phone]  If 7PM-7AM, please contact night-coverage [email or web address] Password [email or web address] 09/07/2017, 2:14 PM

## 2017-09-07 NOTE — Plan of Care (Signed)
Patient is more alert today than previously.  She is alert to self and situation.  Unable to perform all activities of daily living and struggles to move herself in bed at times requiring assistance.  Does not express and understanding of the depth of her health care needs not verbalize an understanding of her plan of care.

## 2017-09-07 NOTE — Progress Notes (Addendum)
Inpatient Diabetes Program Recommendations  AACE/ADA: New Consensus Statement on Inpatient Glycemic Control (2019)  Target Ranges:  Prepandial:   less than 140 mg/dL      Peak postprandial:   less than 180 mg/dL (1-2 hours)      Critically ill patients:  140 - 180 mg/dL   Results for REI, CONTEE (MRN [id number]) as of 09/07/2017 13:40  Ref. Range 09/07/2017 07:56 09/07/2017 11:18  Glucose-Capillary Latest Ref Range: 70 - 99 mg/dL 330 (H) 285 (H)   Review of Glycemic Control  Diabetes history: DM2 Outpatient Diabetes medications: Toujeo 60 units BID, Humulin R U500 60 units with breakfast, 50 units with lunch, 35 units with supper, Victoza 1.8 mg daily Current orders for Inpatient glycemic control: Lantus 65 units daily, Novolog 0-9 units TID with meals, Novolog 0-5 units QHS, Novolog 12 units TID with meals  Inpatient Diabetes Program Recommendations:   Fasting glucose 330 after Lantus 65 units yesterday.  Please consider increasing Lantus to 40 units BID starting tomorrow  Consider an additional 15 units of Lantus today.  Thanks,  Tama Headings RN, MSN, BC-ADM Inpatient Diabetes Coordinator Team Pager [phone] (8a-5p)

## 2017-09-08 LAB — CBC WITH DIFFERENTIAL/PLATELET
Abs Immature Granulocytes: 0.7 10*3/uL — ABNORMAL HIGH (ref 0.0–0.1)
Basophils Absolute: 0.1 10*3/uL (ref 0.0–0.1)
Basophils Relative: 1 %
EOS ABS: 0.2 10*3/uL (ref 0.0–0.7)
EOS PCT: 1 %
HEMATOCRIT: 38.8 % (ref 36.0–46.0)
HEMOGLOBIN: 12.3 g/dL (ref 12.0–15.0)
Immature Granulocytes: 4 %
LYMPHS PCT: 15 %
Lymphs Abs: 2.9 10*3/uL (ref 0.7–4.0)
MCH: 28.2 pg (ref 26.0–34.0)
MCHC: 31.7 g/dL (ref 30.0–36.0)
MCV: 89 fL (ref 78.0–100.0)
MONOS PCT: 6 %
Monocytes Absolute: 1.1 10*3/uL — ABNORMAL HIGH (ref 0.1–1.0)
Neutro Abs: 13.9 10*3/uL — ABNORMAL HIGH (ref 1.7–7.7)
Neutrophils Relative %: 73 %
Platelets: 409 10*3/uL — ABNORMAL HIGH (ref 150–400)
RBC: 4.36 MIL/uL (ref 3.87–5.11)
RDW: 18.4 % — AB (ref 11.5–15.5)
WBC: 18.9 10*3/uL — ABNORMAL HIGH (ref 4.0–10.5)

## 2017-09-08 LAB — BASIC METABOLIC PANEL
Anion gap: 10 (ref 5–15)
BUN: 21 mg/dL — ABNORMAL HIGH (ref 6–20)
CHLORIDE: 100 mmol/L (ref 98–111)
CO2: 29 mmol/L (ref 22–32)
Calcium: 9 mg/dL (ref 8.9–10.3)
Creatinine, Ser: 1.7 mg/dL — ABNORMAL HIGH (ref 0.44–1.00)
GFR calc Af Amer: 39 mL/min — ABNORMAL LOW (ref >60)
GFR calc non Af Amer: 33 mL/min — ABNORMAL LOW (ref >60)
GLUCOSE: 94 mg/dL (ref 70–99)
Potassium: 3.8 mmol/L (ref 3.5–5.1)
Sodium: 139 mmol/L (ref 135–145)

## 2017-09-08 LAB — GLUCOSE, CAPILLARY
Glucose-Capillary: 99 mg/dL (ref 70–99)
Glucose-Capillary: 133 mg/dL — ABNORMAL HIGH (ref 70–99)
Glucose-Capillary: 120 mg/dL — ABNORMAL HIGH (ref 70–99)
Glucose-Capillary: 249 mg/dL — ABNORMAL HIGH (ref 70–99)

## 2017-09-08 MED ORDER — SENNOSIDES-DOCUSATE SODIUM 8.6-50 MG PO TABS: 2.0000 | ORAL_TABLET | Freq: Every evening | ORAL | Status: DC | PRN

## 2017-09-08 MED ORDER — GABAPENTIN 100 MG PO CAPS
100.0000 mg | ORAL_CAPSULE | Freq: Two times a day (BID) | ORAL | Status: DC
  Administered 2017-09-08: 100 mg via ORAL
  Filled 2017-09-08: qty 1

## 2017-09-08 MED ORDER — PREGABALIN 100 MG PO CAPS
100.0000 mg | ORAL_CAPSULE | Freq: Two times a day (BID) | ORAL | Status: DC
  Administered 2017-09-08 – 2017-09-09 (×2): 100 mg via ORAL
  Filled 2017-09-08 (×2): qty 1

## 2017-09-08 MED ORDER — INSULIN GLARGINE 100 UNIT/ML ~~LOC~~ SOLN
40.0000 [IU] | Freq: Two times a day (BID) | SUBCUTANEOUS | Status: DC
  Administered 2017-09-08 – 2017-09-09 (×3): 40 [IU] via SUBCUTANEOUS
  Filled 2017-09-08 (×4): qty 0.4

## 2017-09-08 NOTE — Progress Notes (Addendum)
PROGRESS NOTE   [name]  DZH:[id number]    DOB: 01-05-1965    DOA: 08/29/2017  PCP: [name], PA-C   I have briefly reviewed patients previous medical records in [facility].  Brief Narrative:  52 year old female with PMH of asthma/COPD, stage IV chronic kidney disease, morbid obesity, anxiety and depression, type II DM/IDDM on high doses of insulins, HLD, HTN, GERD, peripheral neuropathy, HIV, OHS, chronic diastolic CHF, left BKA uses prosthesis and chronic pain syndrome, recent hospitalization 08/26/2017-08/27/2017 for COPD exacerbation, apparently living at home by herself, returned to ED on 08/29/2017 after reportedly experiencing a syncopal episode at home, fell and was unable to get up from the floor and stayed on the floor for 24 to 48 hours until she was found by her daughter-in-law who brought her to the hospital.  She was admitted for evaluation and management of syncope, acute on chronic kidney disease complicated by hypernatremia, hyperkalemia, anion gap metabolic acidosis.  She had mental status changes which have resolved.  Hospital course complicated by leukocytosis of unclear etiology and uncontrolled DM, diabetes coordinator assisting.   Assessment & Plan:   Active Problems:   Human immunodeficiency virus (HIV) disease ([place])   Diabetes mellitus type 2 with complications, uncontrolled ([place])   Mood disorder ([place])   Essential hypertension   COPD with acute bronchitis ([place])   Tobacco abuse   Repeated falls   Diabetic neuropathy ([place])   Obesity hypoventilation syndrome ([place])   Hyperlipidemia   CKD (chronic kidney disease), stage III ([name])   Syncope   Syncope: Details surrounding the event not clear.  Etiology hence difficult to determine.  DD: Vasovagal, arrhythmias, dehydration with orthostatic changes.  CT head and cervical spine without acute findings.  Unable to perform orthostatic vital signs due to mental status changes and BKA.  EKG on admission  showed A. fib, repeat EKG showed atrial flutter but cardiology indicated that it was an artifact.  TSH normal.  TTE 8/30: Difficult study.  LV systolic function grossly normal.  Telemetry personally reviewed and showed sinus rhythm.  PT recommended SNF, patient initially refused and then agreeable.  Patient has a SNF bed when medically ready.  Acute encephalopathy: Unclear etiology.  CT head without acute findings.  MRI requested and pending.  Could be due to polypharmacy: Patient on Abilify 15 mg daily, doxepin 10 mg at bedtime, PRN Klonopin, PRN IV morphine and Restoril 15 mg at bedtime as needed.  Denied alcohol use.  Blood alcohol level on this and recent admission negative.  Reduced Restoril to 7.5 mg at bedtime on 9/4.  MRI brain: Limited study but no acute findings.  Ammonia level normal.  Mental status changes resolved 9/6.  Some concern for substance abuse as per history from spouse 9/5.  She apparently lives at home with a cousin who provides her with alcohol and prescription medications purchased on the street.  Continue to monitor.  Resolved and stable.  COPD/asthma exacerbation: Completed 3 days course of oral azithromycin.  Also completed brief course of IV Solu-Medrol.  Bronchospasm clinically resolved.  Dysphagia: Reported coughing with eating.  Speech therapy followed up and recommended continued dysphagia 3 diet.  Speech therapy to follow-up in a.m. to see if diet can be advanced.  Leukocytosis: Neutrophilic leukocytosis, progressively worsening.  Patient reportedly had 4-5 episodes of loose, not watery diarrhea yesterday (MD not informed) but no abdominal pain, nausea or vomiting.  Detailed ROS and physical exam 9/6 not revealing for cause.  I discussed with  an infectious disease MD on 9/6 and in the absence of any clear focus of infection by signs and symptoms, was advised to continue to monitor closely, follow CBCs.  On 9/7, patient for the first time indicated dysuria, difficulty  urinating and nurses have documented malodorous urine.  Patient reported fever and chills but is afebrile here.  Persistent leukocytosis in the 25K range?  Related to UTI.  Patient having formed stools so no concern for C. difficile currently.  Initiated IV ceftriaxone.  Leukocytosis down to 19 K.  Follow.  Acute lower UTI/cystitis: History as noted above.  Continue IV ceftriaxone pending urine culture results.  Improving..  Hypokalemia: Replaced..  Magnesium 2.2.  Acute on stage III chronic kidney disease: Baseline creatinine not clear, has been fluctuating all year.  Had normal creatinine in March and 1.1 in late July.  Discharged 8/26 with creatinine 1.27 and presented with creatinine of 2.04 which had improved to 1.1 but has gradually increased to 1.55.  Unclear etiology.?  Volume depletion.  Currently not on ACEI/ARB or diuretics. Renal ultrasound 8/20: No obstructive uropathy.  Creatinine has increased to 1.8.  Lasix was stopped yesterday.  Clinically appears euvolemic.  As discussed with ID MD on 9/6, [place] the cause elevated creatinine and hence this was discontinued and Biktarvy started.  Encouraged increased oral fluids.  Creatinine down to 1.7, continue to follow.  Dehydration with hypernatremia: Resolved.  Rhabdomyolysis: Secondary to fall and prolonged immobilization on floor PTA.  Resolved.  Abnormal LFTs: Mild.  Suspect due to mild rhabdomyolysis.  Improved..  No GI symptoms reported.  Poorly controlled DM 2: Issues with marked hyper glycemia and hypoglycemia.  Discussing daily with diabetes coordinator.  Discontinued U500, started Lantus, NovoLog mealtime and SSI and adjusting doses as needed.  On 9/7, patient received Lantus 80 units, continued NovoLog 12 units 3 times daily with meals and sensitive SSI.  Diabetes coordinator follow-up appreciated and as per recommendations, changed Lantus to 40 units twice daily from 9/8.  CBGs mostly in the 200s.  Patient reports inability to sleep  due to neuropathy pain and indicates that she was taking Neurontin 300 mg twice daily, not listed on her home meds.  Start Neurontin at 100 mg twice daily and then titrate as tolerated given admission for mental status changes.  Chronic diastolic CHF: Compensated.  Discontinued Lasix 9/6 due to increasing creatinine.  Continue to monitor off of Lasix for now.  HIV: Continue ART. D/W ID on 9/6 who stated that the [facility] can spuriously cause increase creat and this was changed to [place].  Tobacco abuse: Cessation counseled.  Nicotine patch.  Anxiety and depression: Management as indicated above.  Patient reports that she sees psychiatry, Dr. Junie Bame at [facility].   DVT prophylaxis: Subcutaneous heparin Code Status: Full Family Communication: No family at bedside. Disposition: DC to SNF pending clinical improvement, improvement in urinary symptoms, leukocytosis, could be as early as 9/9.   Consultants:  None  Procedures:  Foley catheter-discontinued 9/5.  Antimicrobials:  Azithromycin  Subjective: Reports inability to sleep last night due to leg pain from peripheral neuropathy.  States that she was taking Neurontin at home.  Patient and nurse reported 4-5 BMs yesterday but they were formed.  ROS: As above, otherwise negative.  Objective:  Vitals:   09/07/17 1951 09/07/17 2353 09/08/17 0356 09/08/17 0746  BP: 136/81 122/75 106/61 (!) 77/55  Pulse:  92 88 85  Resp:  16 18 19   Temp: 97.7 F (36.5 C) 97.7 F (36.5  C) 98 F (36.7 C) 98.3 F (36.8 C)  TempSrc: Oral Oral Oral Oral  SpO2: 98% 97% 98% 95%  Weight:      Height:        Examination: No significant change in clinical exam compared to yesterday.  General exam: Young female, moderately built and obese, lying comfortably propped up in bed without distress.  Stable. Respiratory system: Clear to auscultation/poor inspiratory effort. Respiratory effort normal.  Stable. Cardiovascular system: S1 & S2 heard, RRR.  No JVD, murmurs, rubs, gallops or clicks. No pedal edema.  Telemetry personally reviewed: Sinus rhythm. Gastrointestinal system: Abdomen is nondistended, soft and nontender. No organomegaly or masses felt. Normal bowel sounds heard.  Stable. Central nervous system: Alert and oriented x3.  No focal neurological deficits. Extremities: Symmetric 5 x 5 power.  Left BKA.  Upper extremity with some bruises but no active bleeding. Skin: No rashes, lesions or ulcers Psychiatry: Judgement and insight intact. Mood & affect appropriate.  Patient has capacity to make medical decisions for herself.    Data Reviewed: I have personally reviewed following labs and imaging studies  CBC: Recent Labs  Lab 09/02/17 1254 09/05/17 0322 09/06/17 0329 09/07/17 0143 09/08/17 0239  WBC 22.5* 25.1* 25.9* 25.4* 18.9*  NEUTROABS  --   --  21.5* 21.1* 13.9*  HGB 13.8 13.6 14.5 12.8 12.3  HCT 42.7 43.5 46.0 40.1 38.8  MCV 87.3 89.7 89.0 88.9 89.0  PLT 327 321 379 358 258*   Basic Metabolic Panel: Recent Labs  Lab 09/04/17 0317 09/05/17 0322 09/06/17 0329 09/07/17 0143 09/08/17 0239  NA 145 140 142 135 139  K 2.9* 3.6 3.1* 3.9 3.8  CL 98 99 95* 95* 100  CO2 34* 29 31 27 29   GLUCOSE 55* 68* 185* 245* 94  BUN 32* 26* 22* 19 21*  CREATININE 1.55* 1.31* 1.59* 1.82* 1.70*  CALCIUM 9.2 8.7* 9.3 8.8* 9.0  MG 2.2  --   --   --   --    Liver Function Tests: Recent Labs  Lab 09/04/17 1724  AST 25  ALT 33  ALKPHOS 82  BILITOT 0.6  PROT 5.8*  ALBUMIN 2.6*   Cardiac Enzymes: Recent Labs  Lab 09/04/17 1724  CKTOTAL 63   CBG: Recent Labs  Lab 09/07/17 0756 09/07/17 1118 09/07/17 1618 09/07/17 2131 09/08/17 0723  GLUCAP 330* 285* 279* 211* 99    Recent Results (from the past 240 hour(s))  MRSA PCR Screening     Status: None   Collection Time: 08/29/17  6:24 PM  Result Value Ref Range Status   MRSA by PCR NEGATIVE NEGATIVE Final    Comment:        The GeneXpert MRSA Assay (FDA approved  for NASAL specimens only), is one component of a comprehensive MRSA colonization surveillance program. It is not intended to diagnose MRSA infection nor to guide or monitor treatment for MRSA infections. Performed at [facility], [place] [address], [place], [place] [address]          Radiology Studies: No results found.      Scheduled Meds: . ARIPiprazole  15 mg Oral Daily  . bictegravir-emtricitabine-tenofovir AF  1 tablet Oral Daily  . diclofenac sodium  4 g Topical QID  . doxepin  10 mg Oral QHS  . fenofibrate  160 mg Oral Daily  . fluticasone furoate-vilanterol  1 puff Inhalation Daily  . heparin  5,000 Units Subcutaneous Q8H  . insulin aspart  0-5 Units Subcutaneous QHS  .  insulin aspart  0-9 Units Subcutaneous TID WC  . insulin aspart  12 Units Subcutaneous TID WC  . insulin glargine  40 Units Subcutaneous BID  . magnesium oxide  400 mg Oral Daily  . mouth rinse  15 mL Mouth Rinse BID  . multivitamin with minerals  1 tablet Oral Daily  . nicotine  21 mg Transdermal Daily  . pantoprazole  40 mg Oral Daily  . polyethylene glycol  17 g Oral Daily  . rosuvastatin  20 mg Oral Daily  . senna-docusate  2 tablet Oral BID  . sodium chloride flush  3 mL Intravenous Q12H   Continuous Infusions: . sodium chloride 250 mL (09/07/17 1442)  . cefTRIAXone (ROCEPHIN)  IV 1 g (09/07/17 1443)     LOS: 10 days     Vernell Leep, MD, FACP, [facility]. [facility] Pager [phone]  If 7PM-7AM, please contact night-coverage [email or web address] Password [email or web address] 09/08/2017, 9:22 AM

## 2017-09-08 NOTE — Plan of Care (Signed)

## 2017-09-09 ENCOUNTER — Inpatient Hospital Stay (HOSPITAL_COMMUNITY): Payer: Medicare HMO

## 2017-09-09 DIAGNOSIS — I5032 Chronic diastolic (congestive) heart failure: Secondary | ICD-10-CM | POA: Diagnosis not present

## 2017-09-09 DIAGNOSIS — E1142 Type 2 diabetes mellitus with diabetic polyneuropathy: Secondary | ICD-10-CM | POA: Diagnosis not present

## 2017-09-09 DIAGNOSIS — N390 Urinary tract infection, site not specified: Secondary | ICD-10-CM | POA: Diagnosis not present

## 2017-09-09 DIAGNOSIS — R2689 Other abnormalities of gait and mobility: Secondary | ICD-10-CM | POA: Diagnosis not present

## 2017-09-09 DIAGNOSIS — F3181 Bipolar II disorder: Secondary | ICD-10-CM | POA: Diagnosis not present

## 2017-09-09 DIAGNOSIS — N189 Chronic kidney disease, unspecified: Secondary | ICD-10-CM

## 2017-09-09 DIAGNOSIS — N179 Acute kidney failure, unspecified: Secondary | ICD-10-CM

## 2017-09-09 DIAGNOSIS — R488 Other symbolic dysfunctions: Secondary | ICD-10-CM | POA: Diagnosis not present

## 2017-09-09 DIAGNOSIS — Z720 Tobacco use: Secondary | ICD-10-CM

## 2017-09-09 DIAGNOSIS — I10 Essential (primary) hypertension: Secondary | ICD-10-CM | POA: Diagnosis not present

## 2017-09-09 DIAGNOSIS — J449 Chronic obstructive pulmonary disease, unspecified: Secondary | ICD-10-CM | POA: Diagnosis not present

## 2017-09-09 DIAGNOSIS — R296 Repeated falls: Secondary | ICD-10-CM | POA: Diagnosis not present

## 2017-09-09 DIAGNOSIS — Z7401 Bed confinement status: Secondary | ICD-10-CM | POA: Diagnosis not present

## 2017-09-09 DIAGNOSIS — R55 Syncope and collapse: Secondary | ICD-10-CM | POA: Diagnosis not present

## 2017-09-09 DIAGNOSIS — J441 Chronic obstructive pulmonary disease with (acute) exacerbation: Secondary | ICD-10-CM | POA: Diagnosis not present

## 2017-09-09 DIAGNOSIS — E860 Dehydration: Secondary | ICD-10-CM | POA: Diagnosis not present

## 2017-09-09 DIAGNOSIS — N3090 Cystitis, unspecified without hematuria: Secondary | ICD-10-CM | POA: Diagnosis not present

## 2017-09-09 DIAGNOSIS — G8929 Other chronic pain: Secondary | ICD-10-CM | POA: Diagnosis not present

## 2017-09-09 DIAGNOSIS — E785 Hyperlipidemia, unspecified: Secondary | ICD-10-CM | POA: Diagnosis not present

## 2017-09-09 DIAGNOSIS — B964 Proteus (mirabilis) (morganii) as the cause of diseases classified elsewhere: Secondary | ICD-10-CM | POA: Diagnosis not present

## 2017-09-09 DIAGNOSIS — D72829 Elevated white blood cell count, unspecified: Secondary | ICD-10-CM | POA: Diagnosis not present

## 2017-09-09 DIAGNOSIS — M6281 Muscle weakness (generalized): Secondary | ICD-10-CM | POA: Diagnosis not present

## 2017-09-09 DIAGNOSIS — F39 Unspecified mood [affective] disorder: Secondary | ICD-10-CM | POA: Diagnosis not present

## 2017-09-09 DIAGNOSIS — G92 Toxic encephalopathy: Secondary | ICD-10-CM

## 2017-09-09 DIAGNOSIS — M2550 Pain in unspecified joint: Secondary | ICD-10-CM | POA: Diagnosis not present

## 2017-09-09 DIAGNOSIS — E118 Type 2 diabetes mellitus with unspecified complications: Secondary | ICD-10-CM | POA: Diagnosis not present

## 2017-09-09 DIAGNOSIS — E1165 Type 2 diabetes mellitus with hyperglycemia: Secondary | ICD-10-CM | POA: Diagnosis not present

## 2017-09-09 DIAGNOSIS — B20 Human immunodeficiency virus [HIV] disease: Secondary | ICD-10-CM | POA: Diagnosis not present

## 2017-09-09 LAB — CBC WITH DIFFERENTIAL/PLATELET
ABS IMMATURE GRANULOCYTES: 0.7 10*3/uL — AB (ref 0.0–0.1)
BASOS ABS: 0.1 10*3/uL (ref 0.0–0.1)
Basophils Relative: 1 %
EOS PCT: 2 %
Eosinophils Absolute: 0.2 10*3/uL (ref 0.0–0.7)
HEMATOCRIT: 37.5 % (ref 36.0–46.0)
HEMOGLOBIN: 11.7 g/dL — AB (ref 12.0–15.0)
Immature Granulocytes: 5 %
LYMPHS ABS: 2.8 10*3/uL (ref 0.7–4.0)
LYMPHS PCT: 19 %
MCH: 28.5 pg (ref 26.0–34.0)
MCHC: 31.2 g/dL (ref 30.0–36.0)
MCV: 91.2 fL (ref 78.0–100.0)
MONOS PCT: 6 %
Monocytes Absolute: 0.9 10*3/uL (ref 0.1–1.0)
NEUTROS ABS: 10.2 10*3/uL — AB (ref 1.7–7.7)
Neutrophils Relative %: 67 %
Platelets: 367 10*3/uL (ref 150–400)
RBC: 4.11 MIL/uL (ref 3.87–5.11)
RDW: 18.9 % — ABNORMAL HIGH (ref 11.5–15.5)
WBC: 15 10*3/uL — ABNORMAL HIGH (ref 4.0–10.5)

## 2017-09-09 LAB — GLUCOSE, CAPILLARY
GLUCOSE-CAPILLARY: 288 mg/dL — AB (ref 70–99)
GLUCOSE-CAPILLARY: 446 mg/dL — AB (ref 70–99)
GLUCOSE-CAPILLARY: 447 mg/dL — AB (ref 70–99)
GLUCOSE-CAPILLARY: 245 mg/dL — AB (ref 70–99)
Glucose-Capillary: 360 mg/dL — ABNORMAL HIGH (ref 70–99)

## 2017-09-09 LAB — BASIC METABOLIC PANEL
Anion gap: 13 (ref 5–15)
BUN: 21 mg/dL — AB (ref 6–20)
CO2: 25 mmol/L (ref 22–32)
CREATININE: 1.8 mg/dL — AB (ref 0.44–1.00)
Calcium: 8.7 mg/dL — ABNORMAL LOW (ref 8.9–10.3)
Chloride: 100 mmol/L (ref 98–111)
GFR calc Af Amer: 36 mL/min — ABNORMAL LOW (ref >60)
GFR calc non Af Amer: 31 mL/min — ABNORMAL LOW (ref >60)
GLUCOSE: 268 mg/dL — AB (ref 70–99)
POTASSIUM: 3.7 mmol/L (ref 3.5–5.1)
SODIUM: 138 mmol/L (ref 135–145)

## 2017-09-09 LAB — URINE CULTURE: Culture: >=100000 — AB

## 2017-09-09 MED ORDER — INSULIN ASPART 100 UNIT/ML ~~LOC~~ SOLN: 0.0000 [IU] | Freq: Three times a day (TID) | SUBCUTANEOUS | Status: DC

## 2017-09-09 MED ORDER — MAGIC MOUTHWASH
5.0000 mL | Freq: Four times a day (QID) | ORAL | Status: DC | PRN
  Filled 2017-09-09: qty 5

## 2017-09-09 MED ORDER — CEPHALEXIN 500 MG PO CAPS: 500.0000 mg | ORAL_CAPSULE | Freq: Two times a day (BID) | ORAL | Status: AC

## 2017-09-09 MED ORDER — INSULIN ASPART 100 UNIT/ML ~~LOC~~ SOLN: 12.0000 [IU] | Freq: Three times a day (TID) | SUBCUTANEOUS | Status: DC

## 2017-09-09 MED ORDER — TEMAZEPAM 7.5 MG PO CAPS: 7.5000 mg | ORAL_CAPSULE | Freq: Every evening | ORAL | Status: DC | PRN

## 2017-09-09 MED ORDER — ADULT MULTIVITAMIN W/MINERALS CH: 1.0000 | ORAL_TABLET | Freq: Every day | ORAL | Status: DC

## 2017-09-09 MED ORDER — BICTEGRAVIR-EMTRICITAB-TENOFOV 50-200-25 MG PO TABS: 1.0000 | ORAL_TABLET | Freq: Every day | ORAL | Status: DC

## 2017-09-09 MED ORDER — INSULIN GLARGINE 100 UNIT/ML ~~LOC~~ SOLN: 45.0000 [IU] | Freq: Two times a day (BID) | SUBCUTANEOUS | Status: DC

## 2017-09-09 MED ORDER — PREGABALIN 100 MG PO CAPS: 100.0000 mg | ORAL_CAPSULE | Freq: Two times a day (BID) | ORAL | Status: DC

## 2017-09-09 MED ORDER — INSULIN ASPART 100 UNIT/ML ~~LOC~~ SOLN: 0.0000 [IU] | Freq: Every day | SUBCUTANEOUS | Status: DC

## 2017-09-09 MED ORDER — NICOTINE 21 MG/24HR TD PT24: 21.0000 mg | MEDICATED_PATCH | Freq: Every day | TRANSDERMAL | 0 refills | Status: DC

## 2017-09-09 MED ORDER — CLONAZEPAM 0.5 MG PO TABS: 0.2500 mg | ORAL_TABLET | Freq: Two times a day (BID) | ORAL | 0 refills | Status: DC | PRN

## 2017-09-09 NOTE — Progress Notes (Signed)
IV removed; educated on d/c instructions; prescriptions and paperwork will be given to transport; pt's belongings collected at bedside; currently eating dinner and waiting on transport.  [place]  Jusitn Salsgiver, RN

## 2017-09-09 NOTE — Progress Notes (Signed)
Patient will DC to: [facility] Anticipated DC date: 09/09/17 Family notified: Patient notifying family Transport by: PTAR 5:30pm   Per MD patient ready for DC to [facility]. RN, patient, patient's family, and facility notified of DC. Discharge Summary sent to facility. RN given number for report [phone] Room 223). DC packet on chart. Ambulance transport requested for patient.   CSW signing off.  Cedric Fishman, LCSW Clinical Social Worker [phone]

## 2017-09-09 NOTE — Discharge Summary (Signed)
Physician Discharge Summary  [name] OBS:[id number] DOB: 1965-02-24  PCP: Erin Stalker, PA-C  Admit date: 08/29/2017 Discharge date: 09/09/2017  Recommendations for Outpatient Follow-up:  1. MD at SNF in 3 days with repeat labs (CBC & BMP). 2. Erin Stalker, PA-C/PCP upon discharge from SNF. 3. Dr. Bobby [name], ID 4. Dr. Elayne [name], Endocrinology: Patient has next appointment on 10/16/2017 at 1:15 PM.  May consider reporting this to evaluate and better manage her poorly controlled IDDM. 5. Recommend repeating urine microscopy following treatment of UTI and if has persisting microscopic hematuria, may need further evaluation including urology consultation.  Home Health: Patient is discharging to [facility], [place]. Equipment/Devices: None  Discharge Condition: Improved and stable CODE STATUS: Full Diet recommendation: Heart healthy & diabetic diet.  Discharge Diagnoses:  Active Problems:   Human immunodeficiency virus (HIV) disease ([name])   Diabetes mellitus type 2 with complications, uncontrolled ([name])   Mood disorder ([name])   Essential hypertension   COPD with acute bronchitis ([name])   Tobacco abuse   Repeated falls   Diabetic neuropathy ([name])   Obesity hypoventilation syndrome ([name])   Hyperlipidemia   CKD (chronic kidney disease), stage III ([name])   Syncope   Brief Summary: 52 year old female with PMH of asthma/COPD, stage IV chronic kidney disease, morbid obesity, anxiety and depression, type II DM/IDDM on high doses of insulins, HLD, HTN, GERD, peripheral neuropathy, HIV, OHS, chronic diastolic CHF, left BKA uses prosthesis and chronic pain syndrome, recent hospitalization 08/26/2017-08/27/2017 for COPD exacerbation, apparently living at home by herself, returned to ED on 08/29/2017 after reportedly experiencing a syncopal episode at home, fell and was unable to get up from the floor and stayed on the floor for 24 to 48 hours until she was found by her  daughter-in-law who brought her to the hospital.  She was admitted for evaluation and management of syncope, acute on chronic kidney disease complicated by hypernatremia, hyperkalemia, anion gap metabolic acidosis.  She had mental status changes which has resolved.  Hospital course complicated by leukocytosis likely due to UTI and uncontrolled DM.   Assessment & Plan:   Syncope: Details surrounding the event not clear.  Etiology hence difficult to determine.  DD: Vasovagal, arrhythmias, dehydration with orthostatic changes.  CT head and cervical spine without acute findings. EKG on admission showed A. fib, repeat EKG showed atrial flutter but cardiology indicated that it was an artifact.  TSH normal.  TTE 8/30: Difficult study.  LV systolic function grossly normal.  Telemetry in-house did not show arrhythmias.    Patient has been counseled that she should not drive for 6 months.  She verbalized understanding.  Acute encephalopathy: CT head without acute findings.  Could be due to polypharmacy: Patient on Abilify 15 mg daily, doxepin 10 mg at bedtime, PRN Klonopin, and Restoril 15 mg at bedtime as needed.  Denied alcohol use.  Blood alcohol level on this and recent admission negative.  Reduced Restoril to 7.5 mg at bedtime on 9/4.  MRI brain: Limited study but no acute findings.  Ammonia level normal.  Mental status changes resolved 9/6.  Some concern for substance abuse as per history from son 9/5.  She apparently lives at home with a cousin who provides her with alcohol and prescription medications purchased on the street but patient denies this.    COPD/asthma exacerbation: Completed 3 days course of oral azithromycin.  Also completed brief course of IV Solu-Medrol.  Bronchospasm clinically resolved.  Dysphagia: Reported coughing with eating.  Speech therapy has reevaluated today and recommends regular consistency diet and thin liquids.  Leukocytosis: Initially it was not clear as to cause of  this.  Subsequently felt to be related due to acute lower UTI.  Treated with IV ceftriaxone and leukocytosis has progressively improved as below.  Follow CBC as outpatient.  Proteus mirabilis acute lower UTI/cystitis: History as noted above.    Patient completed 2 days of IV ceftriaxone.  Discharging on oral Keflex for additional 5 days to complete total 7 days treatment.  Hypokalemia: Replaced..  Magnesium 2.2.  Patient has intermittent loose stools which may be related to magnesium supplements and may consider stopping them if has frequent loose stools.  Acute on stage III chronic kidney disease: Baseline creatinine not clear, has been fluctuating all year.  Had normal creatinine in March and 1.1 in late July.  Discharged 8/26 with creatinine 1.27 and presented with creatinine of 2.04 which had improved to 1.1 but has gradually increased to 1.55.  Unclear etiology.?  Volume depletion.  Currently not on ACEI/ARB or diuretics. Renal ultrasound 8/20: No obstructive uropathy.  Creatinine has increased to 1.8.  Lasix was stopped.  Clinically appears euvolemic.  As discussed with ID MD on 9/6, Genvoya can cause elevated creatinine and hence this was discontinued and Biktarvy started.  Encouraged increased oral fluids.  Creatinine fluctuating and stable in the 1.7-1.8 range for the last 3 days.  Since patient does not appear overtly volume overloaded, held Lasix at discharge.  Not sure how long it takes for effect of Genvoya and creatinine to clear.  Follow BMP in a couple [facility] at [facility].  If worsening, consider nephrology consultation.  Dehydration with hypernatremia: Resolved.  Rhabdomyolysis: Secondary to fall and prolonged immobilization on floor PTA.  Resolved.  Abnormal LFTs: Mild.  Suspect due to mild rhabdomyolysis.  Improved..  No GI symptoms reported.  Poorly controlled DM 2: Likely related to very poor compliance with diet and medications.  As discussed extensively with diabetes coordinator,  suspect that she does not take her medications regularly.  She was started on Lantus, mealtime NovoLog and SSI and these were closely followed and adjusted including at the time of discharge.  These need to be closely monitored at SNF and adjust as needed.  Strongly recommend outpatient follow-up with endocrinology.  Also as per diabetes coordinator recommendation, insulin should not be given in abdominal areas with hardened tissue or bruising where it may not respond as expected.  As discussed with pharmacy, patient was taking Lyrica PTA for her neuropathy and this was resumed at renally adjusted dose.   Chronic diastolic CHF: Compensated.  Discontinued Lasix 9/6 due to increasing creatinine.    Clinically appears euvolemic and Lasix not resumed at discharge.  Close follow-up at SNF.  HIV: Continue ART. D/W ID on 9/6 who stated that the [facility] can spuriously cause increase creat and this was changed to [place].  Outpatient follow-up with infectious disease.  Tobacco abuse: Cessation counseled.  Nicotine patch.  Anxiety and depression: Management as indicated above.  Patient reports that she sees psychiatry, Dr. Junie Bame at [facility].  Asthma/COPD: No clinical exacerbation.  Also patient is not hypoxic on room air.  Not sure if she was on oxygen PTA.  Chronic pain: May be related to her neuropathy.  All opioids were discontinued in the hospital.  Hence Narcan not continued at discharge to SNF.  Microscopic hematuria: Please see recommendations above.  Consultants:  None  Procedures:  Foley catheter-discontinued 9/5.  Discharge Instructions  Discharge Instructions    (HEART FAILURE PATIENTS) Call MD:  Anytime you have any of the following symptoms: 1) 3 pound weight gain in 24 hours or 5 pounds in 1 week 2) shortness of breath, with or without a dry hacking cough 3) swelling in the hands, feet or stomach 4) if you have to sleep on extra pillows at night in order to breathe.    Complete by:  As directed    Call MD for:   Complete by:  As directed    Confusion or altered mental status.   Call MD for:  difficulty breathing, headache or visual disturbances   Complete by:  As directed    Call MD for:  extreme fatigue   Complete by:  As directed    Call MD for:  persistant dizziness or light-headedness   Complete by:  As directed    Call MD for:  persistant nausea and vomiting   Complete by:  As directed    Call MD for:  severe uncontrolled pain   Complete by:  As directed    Call MD for:  temperature >100.4   Complete by:  As directed    Diet - low sodium heart healthy   Complete by:  As directed    Diet Carb Modified   Complete by:  As directed    Increase activity slowly   Complete by:  As directed        Medication List    STOP taking these medications   azithromycin 250 MG tablet Commonly known as:  ZITHROMAX   elvitegravir-cobicistat-emtricitabine-tenofovir 150-150-200-10 MG Tabs tablet Commonly known as:  GENVOYA   furosemide 80 MG tablet Commonly known as:  LASIX   HYDROcodone-acetaminophen 10-325 MG tablet Commonly known as:  NORCO   Insulin Glargine 300 UNIT/ML Sopn Replaced by:  insulin glargine 100 UNIT/ML injection   insulin regular human CONCENTRATED 500 UNIT/ML kwikpen Commonly known as:  HUMULIN R   liraglutide 18 MG/3ML Sopn Commonly known as:  VICTOZA   NARCAN 4 MG/0.1ML Liqd nasal spray kit Generic drug:  naloxone   OXYGEN   predniSONE 20 MG tablet Commonly known as:  DELTASONE   SYMBICORT 80-4.5 MCG/ACT inhaler Generic drug:  budesonide-formoterol   tiZANidine 4 MG tablet Commonly known as:  ZANAFLEX     TAKE these medications   albuterol 108 (90 Base) MCG/ACT inhaler Commonly known as:  PROVENTIL HFA;VENTOLIN HFA INHALE 2 PUFFS INTO THE LUNGS EVERY 6 HOURS AS NEEDED FOR WHEEZING OR SHORTNESS OF BREATH What changed:  See the new instructions.   ARIPiprazole 15 MG tablet Commonly known as:  ABILIFY Take  15 mg by mouth daily.   bictegravir-emtricitabine-tenofovir AF 50-200-25 MG Tabs tablet Commonly known as:  BIKTARVY Take 1 tablet by mouth daily. Start taking on:  09/10/2017   BREO ELLIPTA 100-25 MCG/INH Aepb Generic drug:  fluticasone furoate-vilanterol INHALE 1 PUFF INTO THE LUNGS DAILY What changed:  See the new instructions.   cephALEXin 500 MG capsule Commonly known as:  KEFLEX Take 1 capsule (500 mg total) by mouth 2 (two) times daily for 5 days.   clonazePAM 0.5 MG tablet Commonly known as:  KLONOPIN Take 0.5 tablets (0.25 mg total) by mouth 2 (two) times daily as needed for anxiety. What changed:    how much to take  when to take this   diphenoxylate-atropine 2.5-0.025 MG tablet Commonly known as:  LOMOTIL Take 1 tablet by mouth 4 (four) times daily as needed for diarrhea  or loose stools.   doxepin 10 MG capsule Commonly known as:  SINEQUAN Take 10 mg by mouth at bedtime.   fenofibrate 145 MG tablet Commonly known as:  TRICOR Take 145 mg by mouth daily.   insulin aspart 100 UNIT/ML injection Commonly known as:  novoLOG Inject 0-5 Units into the skin at bedtime. Correction coverage: HS scale CBG < 70: implement hypoglycemia protocol CBG 70 - 120: 0 units CBG 121 - 150: 0 units CBG 151 - 200: 0 units CBG 201 - 250: 2 units CBG 251 - 300: 3 units CBG 301 - 350: 4 units CBG 351 - 400: 5 units CBG > 400: call MD.   insulin aspart 100 UNIT/ML injection Commonly known as:  novoLOG Inject 0-9 Units into the skin 3 (three) times daily with meals. Correction coverage: Sensitive (thin, NPO, renal) CBG < 70: implement hypoglycemia protocol CBG 70 - 120: 0 units CBG 121 - 150: 1 unit CBG 151 - 200: 2 units CBG 201 - 250: 3 units CBG 251 - 300: 5 units CBG 301 - 350: 7 units CBG 351 - 400: 9 units CBG > 400: call MD.   insulin aspart 100 UNIT/ML injection Commonly known as:  novoLOG Inject 12 Units into the skin 3 (three) times daily with meals.   insulin  glargine 100 UNIT/ML injection Commonly known as:  LANTUS Inject 0.45 mLs (45 Units total) into the skin 2 (two) times daily. Replaces:  Insulin Glargine 300 UNIT/ML Sopn   MAGNESIUM-OXIDE 400 (241.3 Mg) MG tablet Generic drug:  magnesium oxide Take 400 mg by mouth daily.   multivitamin with minerals Tabs tablet Take 1 tablet by mouth daily. Start taking on:  09/10/2017   nicotine 21 mg/24hr patch Commonly known as:  NICODERM CQ - dosed in mg/24 hours Place 1 patch (21 mg total) onto the skin daily. Start taking on:  09/10/2017   pantoprazole 40 MG tablet Commonly known as:  PROTONIX Take 40 mg by mouth daily.   pregabalin 100 MG capsule Commonly known as:  LYRICA Take 1 capsule (100 mg total) by mouth 2 (two) times daily. What changed:    medication strength  how much to take  additional instructions   rosuvastatin 20 MG tablet Commonly known as:  CRESTOR Take 20 mg by mouth daily. Reported on 05/02/2015   temazepam 7.5 MG capsule Commonly known as:  RESTORIL Take 1 capsule (7.5 mg total) by mouth at bedtime as needed for sleep. What changed:    medication strength  how much to take       Contact information for follow-up providers    Erin Stalker, PA-C. Schedule an appointment as soon as possible for a visit.   Specialty:  Family Medicine Why:  Upon discharge from SNF. Contact information: [place] [address] [phone]        Campbell Riches, MD .   Specialty:  Infectious Diseases Contact information: [address] [place] [address] [phone]        Fay Records, MD .   Specialty:  Cardiology Contact information: [place] [address] [phone]        MD at SNF. Schedule an appointment as soon as possible for a visit in 3 day(s).   Why:  To be seen with repeat labs (CBC & BMP).  Please adjust insulins as needed for diabetes control.  Contact information for after-discharge care    Destination    [facility] .   Service:  Skilled Nursing Contact information: [address] [address] [phone]                 Allergies  Allergen Reactions  . Cortizone-10 [Hydrocortisone] Rash    Per patient, given local injection at knee and developed rash at local site.       Procedures/Studies: Dg Chest 2 View  Result Date: 08/29/2017 CLINICAL DATA:  Syncope.  Shortness of breath. EXAM: CHEST - 2 VIEW COMPARISON:  One-view chest x-ray 08/26/2017 FINDINGS: Heart is enlarged. The there is no edema or effusion. No focal airspace disease is present. Lung volumes are low. Exaggerated thoracic kyphosis is present. Remote right sixth and seventh rib fractures are again noted. IMPRESSION: 1. Stable cardiomegaly without failure. 2. Low lung volumes. Electronically Signed   By: San Morelle M.D.   On: 08/29/2017 12:21   Dg Chest 2 View  Result Date: 08/26/2017 CLINICAL DATA:  52 year old female with shortness of breath and asthma EXAM: CHEST - 2 VIEW COMPARISON:  Prior chest x-ray 08/25/2017 FINDINGS: Mild cardiomegaly, stable. Atherosclerotic calcifications are present in the transverse aorta. No mediastinal abnormality. No overt pulmonary edema, pleural effusion, pneumothorax or focal airspace consolidation. Chronic bronchitic changes are stable. No acute osseous abnormality. IMPRESSION: Stable cardiomegaly without evidence of acute cardiopulmonary process. Electronically Signed   By: Jacqulynn Cadet M.D.   On: 08/26/2017 10:38   Ct Head Wo Contrast  Result Date: 08/29/2017 CLINICAL DATA:  Syncope and fall 3 days ago.  Initial encounter. EXAM: CT HEAD WITHOUT CONTRAST CT CERVICAL SPINE WITHOUT CONTRAST TECHNIQUE: Multidetector CT imaging of the head and cervical spine was performed following the standard protocol without intravenous contrast. Multiplanar CT image  reconstructions of the cervical spine were also generated. COMPARISON:  None. FINDINGS: CT HEAD FINDINGS Brain: No evidence of acute infarction, hemorrhage, hydrocephalus, extra-axial collection or mass lesion/mass effect. Mild appearing chronic microvascular ischemic change is noted. Vascular: Atherosclerotic vascular disease is seen. Skull: No fracture or focal lesion. Sinuses/Orbits: Negative. Other: None. CT CERVICAL SPINE FINDINGS Alignment: Normal. Skull base and vertebrae: No acute fracture. No primary bone lesion or focal pathologic process. Soft tissues and spinal canal: No prevertebral fluid or swelling. No visible canal hematoma. Disc levels: Mild loss of disc space height and endplate spurring are seen at C5-6. Otherwise negative. Upper chest: Lung apices clear. Other: None. IMPRESSION: No acute abnormality head or cervical spine. Mild chronic microvascular ischemic change. Atherosclerosis. Mild degenerative disc disease C5-6. Electronically Signed   By: Inge Rise M.D.   On: 08/29/2017 12:11   Ct Cervical Spine Wo Contrast  Result Date: 08/29/2017 CLINICAL DATA:  Syncope and fall 3 days ago.  Initial encounter. EXAM: CT HEAD WITHOUT CONTRAST CT CERVICAL SPINE WITHOUT CONTRAST TECHNIQUE: Multidetector CT imaging of the head and cervical spine was performed following the standard protocol without intravenous contrast. Multiplanar CT image reconstructions of the cervical spine were also generated. COMPARISON:  None. FINDINGS: CT HEAD FINDINGS Brain: No evidence of acute infarction, hemorrhage, hydrocephalus, extra-axial collection or mass lesion/mass effect. Mild appearing chronic microvascular ischemic change is noted. Vascular: Atherosclerotic vascular disease is seen. Skull: No fracture or focal lesion. Sinuses/Orbits: Negative. Other: None. CT CERVICAL SPINE FINDINGS Alignment: Normal. Skull base and vertebrae: No acute fracture. No primary bone lesion or focal pathologic process. Soft  tissues and spinal canal: No prevertebral fluid or swelling.  No visible canal hematoma. Disc levels: Mild loss of disc space height and endplate spurring are seen at C5-6. Otherwise negative. Upper chest: Lung apices clear. Other: None. IMPRESSION: No acute abnormality head or cervical spine. Mild chronic microvascular ischemic change. Atherosclerosis. Mild degenerative disc disease C5-6. Electronically Signed   By: Inge Rise M.D.   On: 08/29/2017 12:11   Mr Brain Wo Contrast  Result Date: 09/05/2017 CLINICAL DATA:  Altered mental status. Syncopal episode. History of diabetes and HIV infection. EXAM: MRI HEAD WITHOUT CONTRAST TECHNIQUE: Multiplanar, multiecho pulse sequences of the brain and surrounding structures were obtained without intravenous contrast. COMPARISON:  Head CT 08/29/2017 FINDINGS: The examination had to be discontinued prior to completion due to patient pain. Axial diffusion, axial FLAIR, and susceptibility weighted imaging was performed. There is no restricted diffusion to indicate acute infarct. Susceptibility weighted imaging is moderately to severely motion degraded without gross hemorrhage identified. Mild cerebral atrophy is advanced for age. Patchy cerebral white matter T2 hyperintensities are moderately advanced for age and nonspecific but compatible with chronic small vessel ischemic disease. No intracranial mass effect or extra-axial fluid collection is evident. IMPRESSION: 1. Incomplete examination. 2. No acute infarct. 3. Age advanced chronic small vessel ischemic disease and cerebral atrophy. Electronically Signed   By: Logan Bores M.D.   On: 09/05/2017 22:27   Dg Chest Port 1 View  Result Date: 09/01/2017 CLINICAL DATA:  Followup chest congestion EXAM: PORTABLE CHEST 1 VIEW COMPARISON:  Three days ago FINDINGS: Cardiomegaly. Airway thickening with cuffing and tram track appearance. Lung volumes are low and there is mild dependent atelectasis. There is no edema,  consolidation, effusion, or pneumothorax. Remote bilateral rib fractures. IMPRESSION: Cardiomegaly and chronic bronchitic markings.  No acute finding. Electronically Signed   By: Monte Fantasia M.D.   On: 09/01/2017 14:18   Dg Chest Port 1 View  Result Date: 08/26/2017 CLINICAL DATA:  Asthmatic, dyspnea and wheeze. EXAM: PORTABLE CHEST 1 VIEW COMPARISON:  08/26/2017 FINDINGS: AP portable semi upright view of the chest. Stable cardiomegaly. Chronic mild bronchitic change of the lungs. No overt pulmonary edema or pulmonary consolidations. Aortic arch is obscured due to overlap with the thoracic spine given patient's slight rotation. No acute osseous abnormality. IMPRESSION: Cardiomegaly. Chronic mild bronchitic change of the lungs. No active pulmonary disease. Electronically Signed   By: Ashley Royalty M.D.   On: 08/26/2017 21:32   Dg Chest Portable 1 View  Result Date: 08/25/2017 CLINICAL DATA:  Shortness of breath, wheezing EXAM: PORTABLE CHEST 1 VIEW COMPARISON:  03/02/2017 FINDINGS: Cardiomegaly. No overt edema. No confluent opacities or effusions. No acute bony abnormality. IMPRESSION: Cardiomegaly.  No active disease. Electronically Signed   By: Rolm Baptise M.D.   On: 08/25/2017 21:31   Dg Knee Complete 4 Views Right  Result Date: 08/29/2017 CLINICAL DATA:  Fall.  Knee pain. EXAM: RIGHT KNEE - COMPLETE 4+ VIEW COMPARISON:  None. FINDINGS: No evidence of fracture, dislocation, or joint effusion. No evidence of arthropathy or other focal bone abnormality. Vascular calcification. Soft tissues otherwise unremarkable. IMPRESSION: No fracture, dislocation, or effusion. Electronically Signed   By: Staci Righter M.D.   On: 08/29/2017 12:21   Dg Swallowing Func-speech Pathology  Result Date: 09/09/2017 Objective Swallowing Evaluation: Type of Study: MBS-Modified Barium Swallow Study  Patient Details Name: [name] MRN: [id number] Date of Birth: 1965/10/21 Today's Date: 09/09/2017 Time: SLP Start Time  (ACUTE ONLY): 0940 -SLP Stop Time (ACUTE ONLY): 0956 SLP Time Calculation (min) (ACUTE ONLY): 16 min  Past Medical History: Past Medical History: Diagnosis Date . Acute metabolic encephalopathy 3/95/3202 . Acute on chronic respiratory failure with hypoxia ([place]) 05/19/2016 . Anxiety  . ARF (acute renal failure) ([place]) 05/03/2014 . Arthritis   LEFT HIP . Asthma   HOSPITALIZED WITH EXCERBATION OF ASTHMA - AND BRONCHITIS AND THE FLU DEC 2013 . Asthma exacerbation attacks 12/28/2011 . Bronchitis 12/28/2011 . CAP (community acquired pneumonia) 07/25/2012 . Chronic kidney disease (CKD), stage IV (severe) ([place]) 11/10/2014 . Class 3 obesity due to excess calories with serious comorbidity and body mass index (BMI) of 40.0 to 44.9 in adult  . Colles' fracture of left radius  . COPD (chronic obstructive pulmonary disease) ([place])  . Depression  . Diabetes mellitus   ON INSULIN AND ORAL MEDICATIONS . Diabetes mellitus type 2 with complications, uncontrolled ([place]) 04/19/2008  Qualifier: Diagnosis of  By: Tomma Lightning MD, Claiborne Billings    . Diabetic foot infection ([place]) 11/09/2014 . Diabetic ketoacidosis without coma associated with type 2 diabetes mellitus ([place])  . Diabetic neuropathy ([place]) 11/10/2014 . Diarrhea 08/30/2008  Qualifier: Diagnosis of  By: Tomma Lightning MD, Claiborne Billings   . Diastolic dysfunction 33/04/3566 . Difficult intravenous access  . DKA, type 2 ([place]) 04/04/2016 . Dyslipidemia 12/23/2008  Qualifier: Diagnosis of  By: Tomma Lightning MD, Claiborne Billings   . Essential hypertension 04/19/2008  Qualifier: Diagnosis of  By: Tomma Lightning MD, Claiborne Billings   . FATIGUE 07/12/2008  Qualifier: Diagnosis of  By: Tomma Lightning MD, Claiborne Billings   . Femoral neck fracture ([place]) 07/16/2011 . Femur fracture, left ([place]) 07/08/2014 . Fever  . Fracture of distal femur ([place]) 07/08/2014 . GERD (gastroesophageal reflux disease)  . Hereditary and idiopathic peripheral neuropathy 04/19/2008  Qualifier: Diagnosis of  By: Tomma Lightning MD, Claiborne Billings   . Hip fracture, left ([place]) 07/16/2011 . Hip pain  . HIV infection ([place]) 2000 . Human  immunodeficiency virus (HIV) disease ([place]) 04/19/2008  [id number] +  . Hyperlipidemia  . Hypertension  . Hyponatremia 12/29/2011 . Influenza A 12/29/2011 . Influenza B  . Insomnia 06/12/2016 . Left hip pain 05/03/2014 . Mood disorder ([place]) 04/19/2008  Qualifier: Diagnosis of  By: Tomma Lightning MD, Claiborne Billings   . Neuropathy   NEUROPATHY HANDS AND FEET . NSTEMI (non-ST elevated myocardial infarction) ([place]) 05/19/2016 . Obesity hypoventilation syndrome ([place])  . Osteoporosis 07/08/2014 . Pain   SEVERE PAIN LEFT HIP - HX OF LEFT HIP PINNING JULY 2013 . Perimenopausal symptoms 02/13/2012  LMP around 2011. On estrace and provera since around 2012 for hot flashes, mood swings. Estrace 2 mg daily, Provera 2.5 mg for 5 days each month.  . Repeated falls 07/30/2011 . Sepsis ([place]) 04/04/2016 . Shortness of breath   ALLERGIES ARE "ACTING UP" . SOB (shortness of breath)  . THRUSH 05/20/2008  Qualifier: Diagnosis of  By: Tomma Lightning MD, Claiborne Billings     . Tobacco use disorder 04/14/2010 Past Surgical History: Past Surgical History: Procedure Laterality Date . AMPUTATION Left 11/18/2014  Procedure: LEFT BELOW KNEE AMPUTATION;  Surgeon: Leandrew Koyanagi, MD;  Location: [place];  Service: Orthopedics;  Laterality: Left; . CAST APPLICATION Left 06/02/6835  Procedure: CAST APPLICATION (FIBERGLASS);  Surgeon: Latanya Maudlin, MD;  [facility];  Service: Orthopedics;  Laterality: Left;  CLOSED REDUCTION OF LEFT COLLES FRACTURE WITH SHORT ARM CAST . HARDWARE REMOVAL Left 05/05/2014  Procedure: HARDWARE REMOVAL LEFT HIP;  Surgeon: Latanya Maudlin, MD;  [facility];  Service: Orthopedics;  Laterality: Left;  REMOVAL BIOMET 6.5-8.0 CANNULATED SCREW . HIP ARTHROPLASTY Left 05/05/2014  Procedure: ARTHROPLASTY OPEN REDUCTION INTERNAL FIXATION LEFT HIP  AND REMOVAL OF TWO CANNULATED SCREW;  Surgeon: Latanya Maudlin, MD;  [facility];  Service: Orthopedics;  Laterality: Left; . HIP PINNING,CANNULATED  07/16/2011  Procedure: CANNULATED HIP PINNING;  Surgeon: Gearlean Alf, MD;   [facility];  Service: Orthopedics;  Laterality: Left; . HIP PINNING,CANNULATED Left 04/09/2012  Procedure: CANNULATED HIP PINNING AND HARDWARE REVISION;  Surgeon: Gearlean Alf, MD;  [facility];  Service: Orthopedics;  Laterality: Left; . I&D EXTREMITY Left 11/16/2014  Procedure:  DEBRIDEMENT OF LEFT FOOT POSSIBLE APPLICATION OF INTEGRIA AND VAC ;  Surgeon: Irene Limbo, MD;  Location: [place];  Service: Plastics;  Laterality: Left; . PERIPHERAL VASCULAR CATHETERIZATION N/A 11/15/2014  Procedure: Abdominal Aortogram;  Surgeon: Conrad Trimble, MD;  Location: [facility];  Service: Cardiovascular;  Laterality: N/A; . PERIPHERAL VASCULAR CATHETERIZATION  11/15/2014  Procedure: Lower Extremity Angiography;  Surgeon: Conrad [place], MD;  Location: [facility];  Service: Cardiovascular;; . PERIPHERAL VASCULAR CATHETERIZATION Left 11/15/2014  Procedure: Peripheral Vascular Intervention;  Surgeon: Conrad [place], MD;  Location: [facility];  Service: Cardiovascular;  Laterality: Left;  popliteal artery stenting HPI: [name] is a 52 y.o. female with medical history significant of COPD, type 2 diabetes mellitus, HIV, diastolic heart failure, dyslipidemia and chronic pain syndrome.  Patient was discharged from the hospital August 27 after being treated for COPD exacerbation.  Apparently she lives at home by herself.  She reported experiencing a syncope episode in her bedroom, unclear about prodromal symptoms, but apparently after recovering her consciousness she was not able to stand.  She had a left BKA and uses a prosthesis.  At the time of her fall she was not using the prosthesis.  She spent about 24 to 48 hours on the floor, until she was found by her daughter-in-law who brought her to the hospital. hos been observed to cough while eating. no SLP history in chart.  No data recorded Assessment / Plan / Recommendation CHL IP CLINICAL IMPRESSIONS 09/09/2017 Clinical Impression Pt  demonstrated much improved swallow function, mentation, cooperativeness and ability to follow commands with min cues. She presented with mild oropharyngeal dysphagia marked by trace flash penetration and mild intermittant oral holding with thin liquids. Mild-mod pharyngeal residue in valleculae and pyriform sinuses observed across consistencies (thin, nectar, solid), although swallows of secretions in between trials were effective to reduce residue. Delayed yet functional swallow initiation most consitently triggered at pyriforms. Flash penetration above vocal folds during swallow observed X2 thin, and X1 dual consistency. Flash penetrations likely due to decreased timing and coordination of swallow initiation with thin. Given absence of aspiration and markedly improved swallow function in comparison to 9/3 MBS, recommend upgrade to regular texture diet and thin liquids, small bites and sips, multiple dry swallows to clear residue, continue medications whole in applesauce. ST will follow up to montior diet tolerance and use of compensatory strategies.  SLP Visit Diagnosis Dysphagia, oropharyngeal phase (R13.12) Attention and concentration deficit following -- Frontal lobe and executive function deficit following -- Impact on safety and function --   CHL IP TREATMENT RECOMMENDATION 09/09/2017 Treatment Recommendations Therapy as outlined in treatment plan below   Prognosis 09/09/2017 Prognosis for Safe Diet Advancement -- Barriers to Reach Goals Cognitive deficits Barriers/Prognosis Comment -- CHL IP DIET RECOMMENDATION 09/09/2017 SLP Diet Recommendations Regular solids;Thin liquid Liquid Administration via Straw;Cup Medication Administration Whole meds with puree Compensations Minimize environmental distractions;Slow rate;Small sips/bites;Multiple dry swallows after each bite/sip Postural Changes Seated upright  at 90 degrees   CHL IP OTHER RECOMMENDATIONS 09/09/2017 Recommended Consults -- Oral Care Recommendations Oral  care BID Other Recommendations --   CHL IP FOLLOW UP RECOMMENDATIONS 09/09/2017 Follow up Recommendations Skilled Nursing facility   [facility] IP FREQUENCY AND DURATION 09/09/2017 Speech Therapy Frequency (ACUTE ONLY) min 1 x/week Treatment Duration 2 weeks      CHL IP ORAL PHASE 09/09/2017 Oral Phase WFL Oral - Pudding Teaspoon -- Oral - Pudding Cup -- Oral - Honey Teaspoon -- Oral - Honey Cup -- Oral - Nectar Teaspoon -- Oral - Nectar Cup Holding of bolus;WFL Oral - Nectar Straw -- Oral - Thin Teaspoon -- Oral - Thin Cup -- Oral - Thin Straw -- Oral - Puree -- Oral - Mech Soft -- Oral - Regular -- Oral - Multi-Consistency -- Oral - Pill -- Oral Phase - Comment --  CHL IP PHARYNGEAL PHASE 09/09/2017 Pharyngeal Phase Impaired Pharyngeal- Pudding Teaspoon -- Pharyngeal -- Pharyngeal- Pudding Cup -- Pharyngeal -- Pharyngeal- Honey Teaspoon -- Pharyngeal -- Pharyngeal- Honey Cup -- Pharyngeal -- Pharyngeal- Nectar Teaspoon -- Pharyngeal -- Pharyngeal- Nectar Cup WFL Pharyngeal -- Pharyngeal- Nectar Straw NT Pharyngeal -- Pharyngeal- Thin Teaspoon -- Pharyngeal -- Pharyngeal- Thin Cup Delayed swallow initiation-pyriform sinuses;Pharyngeal residue - valleculae;Pharyngeal residue - pyriform;Penetration/Aspiration during swallow Pharyngeal Material enters airway, remains ABOVE vocal cords then ejected out;Material enters airway, remains ABOVE vocal cords and not ejected out Pharyngeal- Thin Straw Delayed swallow initiation-pyriform sinuses;Pharyngeal residue - valleculae;Pharyngeal residue - pyriform;Penetration/Aspiration during swallow Pharyngeal Material enters airway, remains ABOVE vocal cords then ejected out Pharyngeal- Puree -- Pharyngeal -- Pharyngeal- Mechanical Soft -- Pharyngeal -- Pharyngeal- Regular WFL Pharyngeal -- Pharyngeal- Multi-consistency -- Pharyngeal -- Pharyngeal- Pill -- Pharyngeal -- Pharyngeal Comment --  CHL IP CERVICAL ESOPHAGEAL PHASE 09/09/2017 Cervical Esophageal Phase WFL Pudding Teaspoon -- Pudding Cup --  Honey Teaspoon -- Honey Cup -- Nectar Teaspoon -- Nectar Cup -- Nectar Straw -- Thin Teaspoon -- Thin Cup -- Thin Straw -- Puree -- Mechanical Soft -- Regular -- Multi-consistency -- Pill -- Cervical Esophageal Comment -- [place] Siren 09/09/2017, 2:55 PM  Orbie Pyo Litaker M.Ed CCC-SLP Pager [phone]             Dg Swallowing Func-speech Pathology  Result Date: 09/03/2017 Objective Swallowing Evaluation: Type of Study: MBS-Modified Barium Swallow Study  Patient Details Name: [name] MRN: [id number] Date of Birth: [birth year] Today's Date: 09/03/2017 Time: SLP Start Time (ACUTE ONLY): 1405 -SLP Stop Time (ACUTE ONLY): 1417 SLP Time Calculation (min) (ACUTE ONLY): 12 min Past Medical History: Past Medical History: Diagnosis Date . Acute metabolic encephalopathy 1/94/1740 . Acute on chronic respiratory failure with hypoxia ([place]) 05/19/2016 . Anxiety  . ARF (acute renal failure) ([place]) 05/03/2014 . Arthritis   LEFT HIP . Asthma   HOSPITALIZED WITH EXCERBATION OF ASTHMA - AND BRONCHITIS AND THE FLU DEC 2013 . Asthma exacerbation attacks 12/28/2011 . Bronchitis 12/28/2011 . CAP (community acquired pneumonia) 07/25/2012 . Chronic kidney disease (CKD), stage IV (severe) ([place]) 11/10/2014 . Class 3 obesity due to excess calories with serious comorbidity and body mass index (BMI) of 40.0 to 44.9 in adult  . Colles' fracture of left radius  . COPD (chronic obstructive pulmonary disease) ([place])  . Depression  . Diabetes mellitus   ON INSULIN AND ORAL MEDICATIONS . Diabetes mellitus type 2 with complications, uncontrolled ([place]) 04/19/2008  Qualifier: Diagnosis of  By: Tomma Lightning MD, Claiborne Billings    . Diabetic foot infection ([place]) 11/09/2014 . Diabetic ketoacidosis without coma associated with  type 2 diabetes mellitus ([place])  . Diabetic neuropathy ([place]) 11/10/2014 . Diarrhea 08/30/2008  Qualifier: Diagnosis of  By: Tomma Lightning MD, Claiborne Billings   . Diastolic dysfunction 78/09/3808 . Difficult intravenous access  . DKA, type 2 ([place]) 04/04/2016 .  Dyslipidemia 12/23/2008  Qualifier: Diagnosis of  By: Tomma Lightning MD, Claiborne Billings   . Essential hypertension 04/19/2008  Qualifier: Diagnosis of  By: Tomma Lightning MD, Claiborne Billings   . FATIGUE 07/12/2008  Qualifier: Diagnosis of  By: Tomma Lightning MD, Claiborne Billings   . Femoral neck fracture ([place]) 07/16/2011 . Femur fracture, left ([place]) 07/08/2014 . Fever  . Fracture of distal femur ([place]) 07/08/2014 . GERD (gastroesophageal reflux disease)  . Hereditary and idiopathic peripheral neuropathy 04/19/2008  Qualifier: Diagnosis of  By: Tomma Lightning MD, Claiborne Billings   . Hip fracture, left ([place]) 07/16/2011 . Hip pain  . HIV infection ([place]) 2000 . Human immunodeficiency virus (HIV) disease ([place]) 04/19/2008  [id number] +  . Hyperlipidemia  . Hypertension  . Hyponatremia 12/29/2011 . Influenza A 12/29/2011 . Influenza B  . Insomnia 06/12/2016 . Left hip pain 05/03/2014 . Mood disorder ([place]) 04/19/2008  Qualifier: Diagnosis of  By: Tomma Lightning MD, Claiborne Billings   . Neuropathy   NEUROPATHY HANDS AND FEET . NSTEMI (non-ST elevated myocardial infarction) ([place]) 05/19/2016 . Obesity hypoventilation syndrome ([place])  . Osteoporosis 07/08/2014 . Pain   SEVERE PAIN LEFT HIP - HX OF LEFT HIP PINNING JULY 2013 . Perimenopausal symptoms 02/13/2012  LMP around 2011. On estrace and provera since around 2012 for hot flashes, mood swings. Estrace 2 mg daily, Provera 2.5 mg for 5 days each month.  . Repeated falls 07/30/2011 . Sepsis ([place]) 04/04/2016 . Shortness of breath   ALLERGIES ARE "ACTING UP" . SOB (shortness of breath)  . THRUSH 05/20/2008  Qualifier: Diagnosis of  By: Tomma Lightning MD, Claiborne Billings     . Tobacco use disorder 04/14/2010 Past Surgical History: Past Surgical History: Procedure Laterality Date . AMPUTATION Left 11/18/2014  Procedure: LEFT BELOW KNEE AMPUTATION;  Surgeon: Leandrew Koyanagi, MD;  Location: [facility];  Service: Orthopedics;  Laterality: Left; . CAST APPLICATION Left 01/07/5100  Procedure: CAST APPLICATION (FIBERGLASS);  Surgeon: Latanya Maudlin, MD;  [facility];  Service: Orthopedics;  Laterality: Left;  CLOSED  REDUCTION OF LEFT COLLES FRACTURE WITH SHORT ARM CAST . HARDWARE REMOVAL Left 05/05/2014  Procedure: HARDWARE REMOVAL LEFT HIP;  Surgeon: Latanya Maudlin, MD;  [facility];  Service: Orthopedics;  Laterality: Left;  REMOVAL BIOMET 6.5-8.0 CANNULATED SCREW . HIP ARTHROPLASTY Left 05/05/2014  Procedure: ARTHROPLASTY OPEN REDUCTION INTERNAL FIXATION LEFT HIP AND REMOVAL OF TWO CANNULATED SCREW;  Surgeon: Latanya Maudlin, MD;  [facility];  Service: Orthopedics;  Laterality: Left; . HIP PINNING,CANNULATED  07/16/2011  Procedure: CANNULATED HIP PINNING;  Surgeon: Gearlean Alf, MD;  [facility];  Service: Orthopedics;  Laterality: Left; . HIP PINNING,CANNULATED Left 04/09/2012  Procedure: CANNULATED HIP PINNING AND HARDWARE REVISION;  Surgeon: Gearlean Alf, MD;  [facility];  Service: Orthopedics;  Laterality: Left; . I&D EXTREMITY Left 11/16/2014  Procedure:  DEBRIDEMENT OF LEFT FOOT POSSIBLE APPLICATION OF INTEGRIA AND VAC ;  Surgeon: Irene Limbo, MD;  Location: [facility];  Service: Plastics;  Laterality: Left; . PERIPHERAL VASCULAR CATHETERIZATION N/A 11/15/2014  Procedure: Abdominal Aortogram;  Surgeon: Conrad [place], MD;  Location: [facility];  Service: Cardiovascular;  Laterality: N/A; . PERIPHERAL VASCULAR CATHETERIZATION  11/15/2014  Procedure: Lower Extremity Angiography;  Surgeon: Conrad , MD;  Location: [facility];  Service: Cardiovascular;; .  PERIPHERAL VASCULAR CATHETERIZATION Left 11/15/2014  Procedure: Peripheral Vascular Intervention;  Surgeon: Conrad [place], MD;  Location: [facility];  Service: Cardiovascular;  Laterality: Left;  popliteal artery stenting HPI: [name] is a 52 y.o. female with medical history significant of COPD, type 2 diabetes mellitus, HIV, diastolic heart failure, dyslipidemia and chronic pain syndrome.  Patient was discharged from the hospital August 27 after being treated for COPD exacerbation.  Apparently she lives at home by  herself.  She reported experiencing a syncope episode in her bedroom, unclear about prodromal symptoms, but apparently after recovering her consciousness she was not able to stand.  She had a left BKA and uses a prosthesis.  At the time of her fall she was not using the prosthesis.  She spent about 24 to 48 hours on the floor, until she was found by her daughter-in-law who brought her to the hospital. hos been observed to cough while eating. no SLP history in chart.  No data recorded Assessment / Plan / Recommendation CHL IP CLINICAL IMPRESSIONS 09/03/2017 Clinical Impression Pharyngeal swallow was initiated at the pyriform sinuses consistently across liquid consistencies with intermittent penetration. Laryngeal closure incomplete allowing penetration of thin barium via straw onto pt's vocal cords without awareness with cued coughed mobilizing barium higher in vestibule but not clearing. Decreasing volume and velocity of bolus with straw removal was not effective and pt presently not reliable for assessing additional strategies due to awareness/alertness. It should be noted pt with frequent coughing at baseline prior to barium presentations and during study at times with questionable barium in vestibule. Minimal vallecular and pyriform sinus residue cleared with additional swallows. Body habitus prevented esophageal scan. Safest recommendations are continuation of nectar thick liquids, straws allowed, Dys 3 texture, pills whole in puree and volitional cough with continued ST for intervention and appropriateness for upgrade.  SLP Visit Diagnosis Dysphagia, pharyngeal phase (R13.13) Attention and concentration deficit following -- Frontal lobe and executive function deficit following -- Impact on safety and function Moderate aspiration risk   CHL IP TREATMENT RECOMMENDATION 09/03/2017 Treatment Recommendations Therapy as outlined in treatment plan below   Prognosis 09/03/2017 Prognosis for Safe Diet Advancement Good  Barriers to Reach Goals Cognitive deficits Barriers/Prognosis Comment -- CHL IP DIET RECOMMENDATION 09/03/2017 SLP Diet Recommendations Dysphagia 3 (Mech soft) solids;Nectar thick liquid Liquid Administration via Cup;Straw Medication Administration Whole meds with puree Compensations Slow rate;Small sips/bites;Clear throat intermittently Postural Changes Seated upright at 90 degrees   CHL IP OTHER RECOMMENDATIONS 09/03/2017 Recommended Consults -- Oral Care Recommendations Oral care BID Other Recommendations --   CHL IP FOLLOW UP RECOMMENDATIONS 09/03/2017 Follow up Recommendations Skilled Nursing facility   [facility] IP FREQUENCY AND DURATION 09/03/2017 Speech Therapy Frequency (ACUTE ONLY) min 2x/week Treatment Duration 2 weeks      CHL IP ORAL PHASE 09/03/2017 Oral Phase WFL Oral - Pudding Teaspoon -- Oral - Pudding Cup -- Oral - Honey Teaspoon -- Oral - Honey Cup -- Oral - Nectar Teaspoon -- Oral - Nectar Cup -- Oral - Nectar Straw -- Oral - Thin Teaspoon -- Oral - Thin Cup -- Oral - Thin Straw -- Oral - Puree -- Oral - Mech Soft -- Oral - Regular -- Oral - Multi-Consistency -- Oral - Pill -- Oral Phase - Comment --  CHL IP PHARYNGEAL PHASE 09/03/2017 Pharyngeal Phase Impaired Pharyngeal- Pudding Teaspoon -- Pharyngeal -- Pharyngeal- Pudding Cup -- Pharyngeal -- Pharyngeal- Honey Teaspoon -- Pharyngeal -- Pharyngeal- Honey Cup -- Pharyngeal -- Pharyngeal- Nectar Teaspoon --  Pharyngeal -- Pharyngeal- Nectar Cup Delayed swallow initiation-pyriform sinuses Pharyngeal -- Pharyngeal- Nectar Straw Delayed swallow initiation-pyriform sinuses Pharyngeal -- Pharyngeal- Thin Teaspoon -- Pharyngeal -- Pharyngeal- Thin Cup Delayed swallow initiation-pyriform sinuses;Penetration/Aspiration during swallow Pharyngeal Material enters airway, remains ABOVE vocal cords and not ejected out Pharyngeal- Thin Straw Penetration/Aspiration during swallow;Delayed swallow initiation-pyriform sinuses Pharyngeal Material enters airway, CONTACTS cords and  not ejected out Pharyngeal- Puree -- Pharyngeal -- Pharyngeal- Mechanical Soft -- Pharyngeal -- Pharyngeal- Regular WFL Pharyngeal -- Pharyngeal- Multi-consistency -- Pharyngeal -- Pharyngeal- Pill -- Pharyngeal -- Pharyngeal Comment --  CHL IP CERVICAL ESOPHAGEAL PHASE 09/03/2017 Cervical Esophageal Phase WFL Pudding Teaspoon -- Pudding Cup -- Honey Teaspoon -- Honey Cup -- Nectar Teaspoon -- Nectar Cup -- Nectar Straw -- Thin Teaspoon -- Thin Cup -- Thin Straw -- Puree -- Mechanical Soft -- Regular -- Multi-consistency -- Pill -- Cervical Esophageal Comment -- [place] Siren 09/03/2017, 2:55 PM Orbie Pyo Litaker M.Ed CCC-SLP Pager [phone]              Dg Hip Unilat W Or Wo Pelvis 2-3 Views Right  Result Date: 08/29/2017 CLINICAL DATA:  Fall 3 days ago with right hip pain, initial encounter EXAM: DG HIP (WITH OR WITHOUT PELVIS) 3V RIGHT COMPARISON:  None. FINDINGS: The pelvic ring is intact. Severe degenerative changes of the hip joints are noted bilaterally. Postsurgical changes in the proximal left hip are noted. Osteopenia is noted as well. IMPRESSION: Chronic changes without acute abnormality. Electronically Signed   By: Inez Catalina M.D.   On: 08/29/2017 12:21      Subjective: Reports that she had 3 soft BMs yesterday.  No abdominal pain.  Tolerating diet.  Still has pain in her lower extremities from peripheral neuropathy, initiated medications yesterday.  Indicates that her mouth is sore at times.  States that she has asked her sister-in-law to bring clothes from home to go to SNF.  As per RN, no acute issues noted.  Discharge Exam:  Vitals:   09/08/17 1217 09/08/17 1716 09/08/17 1955 09/09/17 0835  BP: 120/75 125/72 112/70   Pulse: 96   81  Resp: 19 19 16 16   Temp: 98.4 F (36.9 C) 98.6 F (37 C) 97.9 F (36.6 C)   TempSrc: Oral Oral Oral   SpO2: 97% 98% 98% 94%  Weight:      Height:        General exam: Young female, moderately built and obese, lying comfortably propped up  in bed without distress.    Oral cavity examined and no thrush noted. Respiratory system: Clear to auscultation. Respiratory effort normal.   Cardiovascular system: S1 & S2 heard, RRR. No JVD, murmurs, rubs, gallops or clicks. No pedal edema.  Gastrointestinal system: Abdomen is nondistended, soft and nontender. No organomegaly or masses felt. Normal bowel sounds heard.   Central nervous system: Alert and oriented x3.  No focal neurological deficits. Extremities: Symmetric 5 x 5 power.  Left BKA.  Upper extremity with some bruises but no active bleeding. Skin: No rashes, lesions or ulcers Psychiatry: Judgement and insight intact. Mood & affect appropriate.  Patient has capacity to make medical decisions for herself.    The results of significant diagnostics from this hospitalization (including imaging, microbiology, ancillary and laboratory) are listed below for reference.     Microbiology: Recent Results (from the past 240 hour(s))  Culture, Urine     Status: Abnormal   Collection Time: 09/07/17 12:32 PM  Result Value Ref Range Status   Specimen  Description URINE, CLEAN CATCH  Final   Special Requests   Final    Immunocompromised Performed at [facility], [place] [address], [place], [place] [address]    Culture >=100,000 COLONIES/mL PROTEUS MIRABILIS (A)  Final   Report Status 09/09/2017 FINAL  Final   Organism ID, Bacteria PROTEUS MIRABILIS (A)  Final      Susceptibility   Proteus mirabilis - MIC*    AMPICILLIN <=2 SENSITIVE Sensitive     CEFAZOLIN <=4 SENSITIVE Sensitive     CEFTRIAXONE <=1 SENSITIVE Sensitive     CIPROFLOXACIN <=0.25 SENSITIVE Sensitive     GENTAMICIN <=1 SENSITIVE Sensitive     IMIPENEM 2 SENSITIVE Sensitive     NITROFURANTOIN 128 RESISTANT Resistant     TRIMETH/SULFA <=20 SENSITIVE Sensitive     AMPICILLIN/SULBACTAM <=2 SENSITIVE Sensitive     PIP/TAZO <=4 SENSITIVE Sensitive     * >=100,000 COLONIES/mL PROTEUS MIRABILIS     Labs: CBC: Recent  Labs  Lab 09/05/17 0322 09/06/17 0329 09/07/17 0143 09/08/17 0239 09/09/17 0321  WBC 25.1* 25.9* 25.4* 18.9* 15.0*  NEUTROABS  --  21.5* 21.1* 13.9* 10.2*  HGB 13.6 14.5 12.8 12.3 11.7*  HCT 43.5 46.0 40.1 38.8 37.5  MCV 89.7 89.0 88.9 89.0 91.2  PLT 321 379 358 409* 389   Basic Metabolic Panel: Recent Labs  Lab 09/04/17 0317 09/05/17 0322 09/06/17 0329 09/07/17 0143 09/08/17 0239 09/09/17 0321  NA 145 140 142 135 139 138  K 2.9* 3.6 3.1* 3.9 3.8 3.7  CL 98 99 95* 95* 100 100  CO2 34* 29 31 27 29 25   GLUCOSE 55* 68* 185* 245* 94 268*  BUN 32* 26* 22* 19 21* 21*  CREATININE 1.55* 1.31* 1.59* 1.82* 1.70* 1.80*  CALCIUM 9.2 8.7* 9.3 8.8* 9.0 8.7*  MG 2.2  --   --   --   --   --    Liver Function Tests: Recent Labs  Lab 09/04/17 1724  AST 25  ALT 33  ALKPHOS 82  BILITOT 0.6  PROT 5.8*  ALBUMIN 2.6*   BNP (last 3 results) Recent Labs    02/23/17 1736 08/26/17 1008 08/29/17 1222  BNP 722.2* 157.0* 69.9   Cardiac Enzymes: Recent Labs  Lab 09/04/17 1724  CKTOTAL 63   CBG: Recent Labs  Lab 09/08/17 2103 09/09/17 0818 09/09/17 1209 09/09/17 1217 09/09/17 1347  GLUCAP 249* 288* 447* 446* 360*   Urinalysis    Component Value Date/Time   COLORURINE YELLOW 09/02/2017 1648   APPEARANCEUR CLEAR 09/02/2017 1648   LABSPEC 1.010 09/02/2017 1648   PHURINE 5.0 09/02/2017 1648   GLUCOSEU NEGATIVE 09/02/2017 1648   GLUCOSEU 250 (A) 03/16/2015 1612   HGBUR SMALL (A) 09/02/2017 1648   BILIRUBINUR NEGATIVE 09/02/2017 1648   KETONESUR NEGATIVE 09/02/2017 1648   PROTEINUR NEGATIVE 09/02/2017 1648   UROBILINOGEN 0.2 03/16/2015 1612   NITRITE NEGATIVE 09/02/2017 1648   LEUKOCYTESUR TRACE (A) 09/02/2017 1648      Time coordinating discharge: 60 minutes  SIGNED:  Vernell Leep, MD, FACP, [facility]. [facility] Pager [phone]  If 7PM-7AM, please contact night-coverage [email or web address] Password [facility] 09/09/2017, 3:48 PM

## 2017-09-09 NOTE — Clinical Social Work Note (Signed)
Patient's SNF authorization will expire after today.  [name], [name]

## 2017-09-09 NOTE — Clinical Social Work Placement (Signed)
   CLINICAL SOCIAL WORK PLACEMENT  NOTE  Date:  09/09/2017  Patient Details  Name: [name] MRN: [id number] Date of Birth: 1965/03/17  Clinical Social Work is seeking post-discharge placement for this patient at the [place] level of care (*CSW will initial, date and re-position this form in  chart as items are completed):  Yes   Patient/family provided with [place] Work Department's list of facilities offering this level of care within the geographic area requested by the patient (or if unable, by the patient's family).  Yes   Patient/family informed of their freedom to choose among providers that offer the needed level of care, that participate in Medicare, Medicaid or managed care program needed by the patient, have an available bed and are willing to accept the patient.  Yes   Patient/family informed of [facility]'s ownership interest in [facility], as well as of the fact that they are under no obligation to receive care at these facilities.  PASRR submitted to EDS on 09/03/17     PASRR number received on       Existing PASRR number confirmed on 09/03/17     FL2 transmitted to all facilities in geographic area requested by pt/family on 09/03/17     FL2 transmitted to all facilities within larger geographic area on       Patient informed that his/her managed care company has contracts with or will negotiate with certain facilities, including the following:        Yes   Patient/family informed of bed offers received.  Patient chooses bed at [place] recommends and patient chooses bed at      Patient to be transferred to [facility] on 09/09/17.  Patient to be transferred to facility by [facility]     Patient family notified on 09/09/17 of transfer.  Name of family member notified:  Patient alerting family     PHYSICIAN Please sign FL2     Additional Comment:      _______________________________________________ Benard Halsted, [place] 09/09/2017, 4:25 PM

## 2017-09-09 NOTE — Progress Notes (Signed)
Occupational Therapy Treatment Patient Details Name: [name] MRN: [id number] DOB: Nov 25, 1965 Today's Date: 09/09/2017    History of present illness Pt is a 52 y.o. female admitted 08/29/17 after reportedly having syncopal episode with fall at home and being found down 24-48 hours later. AKI and hypernatremic. Mild rhabdomyolysis, confusion. CT head and cervical spine clear. PMH includes COPD, DMII, HIV, HF, chronic pain, L BKA.    OT comments  Pt progressing towards established OT goals. Pt donning a new gown and her prosthetic at EOB with Min Guard A for safety. performing lateral scoot to drop arm recliner with Mod A for weight shifting. Pt continues to present with decreased cognition, balance, and strength. Continue to recommend SNF and will continue to follow acutely as admitted.    Follow Up Recommendations  SNF;Supervision/Assistance - 24 hour    Equipment Recommendations  Other (comment)(TBD at next venue)    Recommendations for Other Services      Precautions / Restrictions Precautions Precautions: Fall Precaution Comments: L BKA, prosthetic present but needs socks due to very loose fitting Restrictions Weight Bearing Restrictions: No       Mobility Bed Mobility Overal bed mobility: Needs Assistance Bed Mobility: Supine to Sit     Supine to sit: Min guard;HOB elevated     General bed mobility comments: Min Guard A for pt to transition from supine>long sitting>EOB  Transfers Overall transfer level: Needs assistance   Transfers: Lateral/Scoot Transfers          Lateral/Scoot Transfers: Mod assist General transfer comment: Pt requiring Mod A for weight shifting and progressing hips towards right. Pt able to power up through BUEs to raise hips from off of transfer surface, but presenting with poor sequencing to for lateralscoot and weight shift    Balance Overall balance assessment: Needs assistance Sitting-balance support: Bilateral upper extremity  supported Sitting balance-Leahy Scale: Fair Sitting balance - Comments: Min Guard A at EOB while donning prothetic                                   ADL either performed or assessed with clinical judgement   ADL Overall ADL's : Needs assistance/impaired                 Upper Body Dressing : Min guard;Sitting Upper Body Dressing Details (indicate cue type and reason): donned new gown Lower Body Dressing: Minimal assistance;Sitting/lateral leans Lower Body Dressing Details (indicate cue type and reason): Pt donning her prothetic on her LLE. Demonstrating WFL sitting balance Toilet Transfer: Moderate assistance;Transfer board(lateral scoot to drop arm recliner)           Functional mobility during ADLs: Moderate assistance(lateral scoot to drop arm recliner) General ADL Comments: Pt continues to present with decreasesed strength, balance, and activity tolerance     Vision       Perception     Praxis      Cognition Arousal/Alertness: Awake/alert Behavior During Therapy: Flat affect Overall Cognitive Status: No family/caregiver present to determine baseline cognitive functioning Area of Impairment: Following commands;Safety/judgement;Problem solving                     Memory: Decreased recall of precautions Following Commands: Follows one step commands with increased time Safety/Judgement: Decreased awareness of safety;Decreased awareness of deficits Awareness: Emergent Problem Solving: Slow processing;Decreased initiation;Difficulty sequencing;Requires verbal cues;Requires tactile cues General Comments: Pt presenting with increased awareness compared  to prior session. Pt stating "I am thinking I should go to [facility] for rehab." Pt also following simple commands. Difficulty sequencing during mobility and continues to require increased time and cues throughout session        Exercises     Shoulder Instructions       General Comments Pt  with urinary incontience in bed.    Pertinent Vitals/ Pain       Pain Assessment: Faces Faces Pain Scale: Hurts little more Pain Location: back Pain Descriptors / Indicators: Aching Pain Intervention(s): Monitored during session;Limited activity within patient's tolerance;Repositioned  Home Living                                          Prior Functioning/Environment              Frequency  Min 2X/week        Progress Toward Goals  OT Goals(current goals can now be found in the care plan section)  Progress towards OT goals: Progressing toward goals  Acute Rehab OT Goals Patient Stated Goal: didn't state OT Goal Formulation: Patient unable to participate in goal setting Time For Goal Achievement: 09/16/17 Potential to Achieve Goals: Fair ADL Goals Pt Will Perform Grooming: sitting;with min guard assist Pt Will Perform Upper Body Bathing: with min guard assist;sitting Pt Will Perform Upper Body Dressing: with min guard assist;sitting Pt Will Transfer to Toilet: with min assist;stand pivot transfer;bedside commode Additional ADL Goal #1: Pt will be able to roll left and right with min A in prep for coming up to EOB and to A with basic ADLs Additional ADL Goal #2: Pt will be oriented x3 with use of external room cues prn Additional ADL Goal #3: Pt will come up with sit EOB with Min A (HOB flat, use of rail) Additional ADL Goal #4: Pt will be able to don her prothesis while seated EOB with min A  Plan Discharge plan remains appropriate    Co-evaluation                 AM-PAC PT "6 Clicks" Daily Activity     Outcome Measure   Help from another person eating meals?: A Little Help from another person taking care of personal grooming?: A Little Help from another person toileting, which includes using toliet, bedpan, or urinal?: A Lot Help from another person bathing (including washing, rinsing, drying)?: A Lot Help from another person to put on  and taking off regular upper body clothing?: A Lot Help from another person to put on and taking off regular lower body clothing?: A Lot 6 Click Score: 14    End of Session Equipment Utilized During Treatment: Gait belt  OT Visit Diagnosis: Other abnormalities of gait and mobility (R26.89);Muscle weakness (generalized) (M62.81);Other symptoms and signs involving cognitive function;Pain Pain - Right/Left: Right Pain - part of body: Knee   Activity Tolerance Patient tolerated treatment well   Patient Left with call bell/phone within reach;in chair;with chair alarm set   Nurse Communication Mobility status        Time: 3329-5188 OT Time Calculation (min): 29 min  Charges: OT General Charges $OT Visit: 1 Visit OT Treatments $Self Care/Home Management : 23-37 mins  [place], OTR/L Acute Rehab Pager: [phone] Office: [place] 09/09/2017, 5:20 PM

## 2017-09-09 NOTE — Progress Notes (Signed)
Pt transferred via transport. Belongs with pt. Wheelchair left per transport no space for it. Family will be notified and made aware to pick pt wheelchair up as soon as possible.

## 2017-09-09 NOTE — Progress Notes (Signed)
Report given to [facility].   [place]  Taylee Gunnells, RN

## 2017-09-09 NOTE — Discharge Instructions (Signed)

## 2017-09-09 NOTE — Progress Notes (Signed)
Inpatient Diabetes Program Recommendations  AACE/ADA: New Consensus Statement on Inpatient Glycemic Control (2019)  Target Ranges:  Prepandial:   less than 140 mg/dL      Peak postprandial:   less than 180 mg/dL (1-2 hours)      Critically ill patients:  140 - 180 mg/dL   Results for Erin [name], [name] (MRN [id number]) as of 09/09/2017 12:49  Ref. Range 09/08/2017 07:23 09/08/2017 11:23 09/08/2017 16:16 09/08/2017 21:03 09/09/2017 08:18 09/09/2017 12:09 09/09/2017 12:17  Glucose-Capillary Latest Ref Range: 70 - 99 mg/dL 99 133 (H) 120 (H) 249 (H) 288 (H) 447 (H) 446 (H)   Review of Glycemic Control  Diabetes history: DM2 Outpatient Diabetes medications: Toujeo 60 units BID, Humulin R U500 60 units with breakfast, 50 units with lunch, 35 units with supper, Victoza 1.8 mg daily Current orders for Inpatient glycemic control: Lantus 40 unitsBID, Novolog 0-9 units TID with meals, Novolog 0-5 units QHS, Novolog 12 units TID with meals for meal coverage  Inpatient Diabetes Program Recommendations:  Outpatient discharge plan for DM: Recommend discharging patient on Lantus 42 units BID, Novolog 12 units TID with meals, Novolog 0-9 units TID with meals, Novolog 0-5 units QHS, and please ensure Carb Modified diet ordered at SNF. Please ask that insulin not be given in abdominal areas with hardened tissue or bruising.   NOTE: Discussed current glycemic control with Dr. Algis Liming over the phone. Fasting glucose 288 mg/dl and glucose prior to lunch up to 446 mg/dl. Noted Lantus given in abdominal area today and in reviewing chart, noted at times when insulin has been given in abdominal area glucose does not always respond as expected. Question if patient has hardened area (scar tissue) in abdominal areas. Spoke with RN caring for patient today and she reports that ate 100% of breakfast tray but does not have any other food at bedside and has not been asking for any extra food or drink.  RN reports that patient has "bad  bruising" on abdomen. Asked RN to examine abdomen to see if patient has hardened areas of scar tissue and if so not to use those sites for insulin injections. Will follow up with RN in about an hour to ensure glucose is trending down.  Thanks, Barnie Alderman, RN, MSN, CDE Diabetes Coordinator Inpatient Diabetes Program [phone] (Team Pager from 8am to 5pm)

## 2017-09-09 NOTE — Progress Notes (Signed)
Modified Barium Swallow Progress Note  Patient Details  Name: [name] MRN: [id number] Date of Birth: [birth year]  Today's Date: 09/09/2017  Modified Barium Swallow completed.  Full report located under Chart Review in the Imaging Section.  Brief recommendations include the following:  Clinical ImpressionPt demonstrated much improved swallow function, mentation, cooperativeness and ability to follow commands with min cues. She presented with mild oropharyngeal dysphagia marked by trace flash penetration and mild intermittant oral holding with thin liquids. Mild-mod pharyngeal residue in valleculae and pyriform sinuses observed across consistencies (thin, nectar, solid), although swallows of secretions in between trials were effective to reduce residue. Delayed yet functional swallow initiation most consitently triggered at pyriforms. Flash penetration above vocal folds during swallow observed X2 thin, and X1 dual consistency. Flash penetrations likely due to decreased timing and coordination of swallow initiation with thin. Given absence of aspiration and markedly improved swallow function in comparison to 9/3 MBS, recommend upgrade to regular texture diet and thin liquids, small bites and sips, multiple dry swallows to clear residue, continue medications whole in applesauce. ST will follow up to montior diet tolerance and use of compensatory strategies.       Swallow Evaluation Recommendations  Regular;thin liquids                                    [place] Siren 09/09/2017,2:56 PM

## 2017-09-10 ENCOUNTER — Non-Acute Institutional Stay (SKILLED_NURSING_FACILITY): Payer: Medicare HMO | Admitting: Internal Medicine

## 2017-09-10 ENCOUNTER — Encounter: Payer: Self-pay | Admitting: Internal Medicine

## 2017-09-10 DIAGNOSIS — E118 Type 2 diabetes mellitus with unspecified complications: Secondary | ICD-10-CM

## 2017-09-10 DIAGNOSIS — J441 Chronic obstructive pulmonary disease with (acute) exacerbation: Secondary | ICD-10-CM | POA: Diagnosis not present

## 2017-09-10 DIAGNOSIS — F39 Unspecified mood [affective] disorder: Secondary | ICD-10-CM

## 2017-09-10 DIAGNOSIS — R296 Repeated falls: Secondary | ICD-10-CM | POA: Diagnosis not present

## 2017-09-10 DIAGNOSIS — R55 Syncope and collapse: Secondary | ICD-10-CM | POA: Diagnosis not present

## 2017-09-10 DIAGNOSIS — E1165 Type 2 diabetes mellitus with hyperglycemia: Secondary | ICD-10-CM | POA: Diagnosis not present

## 2017-09-10 DIAGNOSIS — IMO0002 Reserved for concepts with insufficient information to code with codable children: Secondary | ICD-10-CM

## 2017-09-10 NOTE — Assessment & Plan Note (Addendum)
PT/OT at SNF Deprescribe polypharmacy as indicated by clinical status

## 2017-09-10 NOTE — Assessment & Plan Note (Signed)
Sliding scale insulin confirmed

## 2017-09-10 NOTE — Assessment & Plan Note (Signed)
Psych NP to follow

## 2017-09-10 NOTE — Progress Notes (Signed)
NURSING HOME LOCATION:  [facility] ROOM NUMBER:  [id number]  CODE STATUS:  DNR  PCP:  [name], PA-C  [place] [address]   This is a comprehensive admission note to [facility] performed on this date less than 30 days from date of admission. Included are preadmission medical/surgical history; reconciled medication list; family history; social history and comprehensive review of systems.   Corrections and additions to the records were documented. Comprehensive physical exam was also performed. Additionally a clinical summary was entered for each active diagnosis pertinent to this admission in the Problem List to enhance continuity of care.  HPI: Patient was hospitalized 8/29-09/09/2017 with syncope.  The patient was unable to rise from the floor and was on the floor for 24-48 hours until found by her daughter-in-law.   She was documented to have acute on chronic kidney disease complicated by hyponatremia, hyperkalemia, and anion gap metabolic acidosis.  The acute presentation was associated with mental status changes which subsequently resolved.  LFTs were elevated mildly, this was suspect due to mild rhabdomyolysis.  Etiology of the syncopal episode was impossible to determine as details surrounding the event were vague and undefined.   EKG initially  suggested A. fib and subsequently atrial flutter.  Cardiology felt these changes represented artifact rather than fixed dysrhythmia.  Patient exhibited no dysrhythmias on telemetry.  She was found to have UTI with associated leukocytosis.  This was in the context of uncontrolled diabetes.  The poor control of diabetes was felt to be related to poor compliance with diet and medications.  Diabetes coordinator consulted & Lantus was initiated and sliding scale continued. Biktarvy was prescribed in place of Genvoya as the latter could spuriously cause an increase in creatinine. The patient had been hospitalized  8/26-8/27 for COPD exacerbation.  She returned home where she lives with a cousin as per her son.She states she lives alone. Dr. Junie Bame at [facility] manages her anxiety and depression. She has chronic pain which is presumed to be neuropathic.  All opioids were discontinued in the hospital.  Because of this Narcan was not continued at discharge.  The patient is to see Dr Kumar,Endocrinology 10/16/2017 to address her poorly controlled insulin-dependent diabetes.  The patient did receive 2 days of IV ceftriaxone for Proteus mirabilis acute lower UTI/cystitis.  She was discharged on oral Keflex for additional 5 days to complete 5 days total. Urinalysis is recommended following treatment of UTI as she has had microscopic hematuria.  If this persists, Urology consultation was to beconsidered. Because there was no definite diagnosis for the syncope, patient was told she should not drive for 6 months.  The patient verbalized understanding. Polypharmacy was suspected as factor as she was on Abilify, doxepin, Klonopin, and prn Restoril.  She did receive a 3-day course of azithromycin and received steroids with resolution of the bronchospasm.  Patient reported coughing with eating.  Speech therapy recommended regular consistent diet with thin liquids.  Past medical and surgical history: HIV, mood disorder,NSTEMI, essential hypertension, COPD with acute bronchitis, tobacco abuse, recurrent falls, diabetic neuropathy, obesity, hypoventilation syndrome, anxiety/depression, and CKD stage III. Surgeries relate to fractures from falls & PVD.  Social history: The chart states that her son is concerned there is some question of alcohol and prescription medication abuse.  Patient denies this.  Family history: Reviewed   Review of systems:   She states that she falls to the right because of loss of bone following hip surgery as  well as having a BKA on the left.  She describes excessive urination which she relates  to having a urinary tract infection.  She describes residual burning.  She denies depression but feels that her mood will be an issue if she is not receiving her Klonopin and Abilify.    Constitutional: No fever, significant weight change, fatigue  Eyes: No redness, discharge, pain, vision change ENT/mouth: No nasal congestion, purulent discharge, earache, change in hearing, sore throat  Cardiovascular: No chest pain, palpitations, paroxysmal nocturnal dyspnea, claudication, edema  Respiratory: No cough, sputum production, hemoptysis, DOE, significant snoring, apnea Gastrointestinal: No heartburn, dysphagia, abdominal pain, nausea /vomiting, rectal bleeding, melena, change in bowels Genitourinary: No hematuria, pyuria, incontinence, nocturia Musculoskeletal: No joint stiffness, joint swelling Dermatologic: No rash, pruritus, change in appearance of skin Neurologic: No dizziness, headache, syncope, seizures, numbness, tingling Endocrine: No change in hair/skin/nails, excessive thirst, excessive hunger Hematologic/lymphatic: No significant bruising, lymphadenopathy, abnormal bleeding Allergy/immunology: No itchy/watery eyes, significant sneezing, urticaria, angioedema  Physical exam:  Pertinent or positive findings: She exhibits moon facies.  Her face is her blank and her affect flat.  She has bilateral ptosis.  There is slight decrease in the left nasolabial fold.  She has coarse rhonchi diffusely.  Heart sounds are somewhat distant.  Abdomen is protuberant.  BKA is present on the left.  The pedal pulses in the right foot are decreased.  She has a trace edema in the right lower extremity.Irregular bruising over forearms. General appearance: Adequately nourished; no acute distress, increased work of breathing is present.   Lymphatic: No lymphadenopathy about the head, neck, axilla. Eyes: No conjunctival inflammation or lid edema is present. There is no scleral icterus. Ears:  External ear exam  shows no significant lesions or deformities.   Nose:  External nasal examination shows no deformity or inflammation. Nasal mucosa are pink and moist without lesions, exudates Oral exam: Lips and gums are healthy appearing.There is no oropharyngeal erythema or exudate. Neck:  No thyromegaly, masses, tenderness noted.    Heart:  Normal rate and regular rhythm. S1 and S2 normal without gallop, murmur, click, rub.  Abdomen: Bowel sounds are normal.  Abdomen is soft and nontender with no organomegaly, hernias, masses. GU: Deferred  Extremities:  No cyanosis, clubbing Neurologic exam: . Balance, Rhomberg, finger to nose testing could not be completed due to clinical state Skin: Warm & dry w/o tenting. No significant lesions or rash.  See clinical summary under each active problem in the Problem List with associated updated therapeutic plan

## 2017-09-10 NOTE — Assessment & Plan Note (Signed)
She has completed the azithromycin and steroids, but she continues to have coarse rhonchi diffusely Pulmonary toilet will be continued

## 2017-09-11 ENCOUNTER — Encounter: Payer: Self-pay | Admitting: Internal Medicine

## 2017-09-11 NOTE — Assessment & Plan Note (Signed)
Polypharmacy felt to be factor Deprescribing as possible of [place] agents

## 2017-09-11 NOTE — Patient Instructions (Signed)
See assessment and plan under each diagnosis in the problem list and acutely for this visit 

## 2017-09-12 LAB — CBC AND DIFFERENTIAL
HEMATOCRIT: 36 (ref 36–46)
Hemoglobin: 11.5 — AB (ref 12.0–16.0)
Neutrophils Absolute: 8
PLATELETS: 283 (ref 150–399)
WBC: 12

## 2017-09-12 LAB — BASIC METABOLIC PANEL
BUN: 21 (ref 4–21)
Creatinine: 1.1 (ref 0.5–1.1)
GLUCOSE: 156
Potassium: 4.2 (ref 3.4–5.3)
Sodium: 144 (ref 137–147)

## 2017-09-19 ENCOUNTER — Telehealth: Payer: Self-pay | Admitting: *Deleted

## 2017-09-19 NOTE — Telephone Encounter (Signed)
Amy from [place] called to advise the patient is inpatient at [facility] and is having trouble with her medications. [facility] [phone] and had to leave a message at the nursing desk to have someone call the office if they need anything from [place].

## 2017-09-26 ENCOUNTER — Other Ambulatory Visit: Payer: Self-pay

## 2017-09-26 MED ORDER — PREGABALIN 100 MG PO CAPS: 100.0000 mg | ORAL_CAPSULE | Freq: Two times a day (BID) | ORAL | 0 refills | Status: DC

## 2017-09-26 NOTE — Telephone Encounter (Signed)
Dr. Linna Darner wrote a hard script.  It was given to the nurse, Danae Chen.

## 2017-10-01 ENCOUNTER — Other Ambulatory Visit: Payer: Self-pay | Admitting: Infectious Diseases

## 2017-10-02 ENCOUNTER — Telehealth: Payer: Self-pay | Admitting: *Deleted

## 2017-10-02 NOTE — Telephone Encounter (Addendum)
Patient called and advised she has been inpatient for at least 1 month and she will be released Friday 10/04/17 and she has not seen her pain doctor so she will not have any pain meds until she sees them again and she wants Dr Johnnye Sima to fill her Lyrica for 1 month so she is not home without it the entire time. Advised will check with him and see as we have not been filling the medication so I can not just do it. Advised will call her back once he responds.

## 2017-10-03 ENCOUNTER — Non-Acute Institutional Stay (SKILLED_NURSING_FACILITY): Payer: Medicare HMO | Admitting: Adult Health

## 2017-10-03 ENCOUNTER — Encounter: Payer: Self-pay | Admitting: Adult Health

## 2017-10-03 ENCOUNTER — Other Ambulatory Visit: Payer: Self-pay | Admitting: Adult Health

## 2017-10-03 DIAGNOSIS — R55 Syncope and collapse: Secondary | ICD-10-CM | POA: Diagnosis not present

## 2017-10-03 DIAGNOSIS — J441 Chronic obstructive pulmonary disease with (acute) exacerbation: Secondary | ICD-10-CM

## 2017-10-03 DIAGNOSIS — E785 Hyperlipidemia, unspecified: Secondary | ICD-10-CM

## 2017-10-03 DIAGNOSIS — G8929 Other chronic pain: Secondary | ICD-10-CM

## 2017-10-03 DIAGNOSIS — G4700 Insomnia, unspecified: Secondary | ICD-10-CM

## 2017-10-03 DIAGNOSIS — I5032 Chronic diastolic (congestive) heart failure: Secondary | ICD-10-CM

## 2017-10-03 DIAGNOSIS — F3181 Bipolar II disorder: Secondary | ICD-10-CM

## 2017-10-03 DIAGNOSIS — E1165 Type 2 diabetes mellitus with hyperglycemia: Secondary | ICD-10-CM

## 2017-10-03 DIAGNOSIS — N390 Urinary tract infection, site not specified: Secondary | ICD-10-CM | POA: Diagnosis not present

## 2017-10-03 DIAGNOSIS — D72829 Elevated white blood cell count, unspecified: Secondary | ICD-10-CM | POA: Diagnosis not present

## 2017-10-03 DIAGNOSIS — E118 Type 2 diabetes mellitus with unspecified complications: Secondary | ICD-10-CM

## 2017-10-03 DIAGNOSIS — B20 Human immunodeficiency virus [HIV] disease: Secondary | ICD-10-CM

## 2017-10-03 DIAGNOSIS — Z720 Tobacco use: Secondary | ICD-10-CM | POA: Diagnosis not present

## 2017-10-03 DIAGNOSIS — IMO0002 Reserved for concepts with insufficient information to code with codable children: Secondary | ICD-10-CM

## 2017-10-03 MED ORDER — TEMAZEPAM 7.5 MG PO CAPS: 7.5000 mg | ORAL_CAPSULE | Freq: Every evening | ORAL | Status: DC | PRN

## 2017-10-03 MED ORDER — INSULIN ASPART 100 UNIT/ML ~~LOC~~ SOLN: 2.0000 [IU] | Freq: Every day | SUBCUTANEOUS | 0 refills | Status: DC

## 2017-10-03 MED ORDER — ALBUTEROL SULFATE HFA 108 (90 BASE) MCG/ACT IN AERS: INHALATION_SPRAY | RESPIRATORY_TRACT | 0 refills | Status: DC

## 2017-10-03 MED ORDER — INSULIN GLARGINE 100 UNIT/ML ~~LOC~~ SOLN: 45.0000 [IU] | Freq: Two times a day (BID) | SUBCUTANEOUS | 0 refills | Status: DC

## 2017-10-03 MED ORDER — FLUTICASONE FUROATE-VILANTEROL 100-25 MCG/INH IN AEPB: INHALATION_SPRAY | RESPIRATORY_TRACT | 0 refills | Status: DC

## 2017-10-03 MED ORDER — DOXEPIN HCL 10 MG PO CAPS: 10.0000 mg | ORAL_CAPSULE | Freq: Every day | ORAL | 0 refills | Status: DC

## 2017-10-03 MED ORDER — ROSUVASTATIN CALCIUM 20 MG PO TABS: 20.0000 mg | ORAL_TABLET | Freq: Every day | ORAL | 0 refills | Status: DC

## 2017-10-03 MED ORDER — NICOTINE 21 MG/24HR TD PT24: 21.0000 mg | MEDICATED_PATCH | Freq: Every day | TRANSDERMAL | 0 refills | Status: DC

## 2017-10-03 MED ORDER — CLONAZEPAM 0.5 MG PO TABS: 0.2500 mg | ORAL_TABLET | Freq: Two times a day (BID) | ORAL | 0 refills | Status: DC | PRN

## 2017-10-03 MED ORDER — FENOFIBRATE 145 MG PO TABS: 145.0000 mg | ORAL_TABLET | Freq: Every day | ORAL | 0 refills | Status: DC

## 2017-10-03 MED ORDER — INSULIN ASPART 100 UNIT/ML ~~LOC~~ SOLN: 12.0000 [IU] | Freq: Three times a day (TID) | SUBCUTANEOUS | 0 refills | Status: DC

## 2017-10-03 MED ORDER — BICTEGRAVIR-EMTRICITAB-TENOFOV 50-200-25 MG PO TABS: 1.0000 | ORAL_TABLET | Freq: Every day | ORAL | 0 refills | Status: DC

## 2017-10-03 MED ORDER — INSULIN ASPART 100 UNIT/ML CARTRIDGE (PENFILL): 0.0000 [IU] | Freq: Three times a day (TID) | SUBCUTANEOUS | 0 refills | Status: DC

## 2017-10-03 MED ORDER — PANTOPRAZOLE SODIUM 40 MG PO TBEC: 40.0000 mg | DELAYED_RELEASE_TABLET | Freq: Every day | ORAL | 0 refills | Status: DC

## 2017-10-03 MED ORDER — ARIPIPRAZOLE 15 MG PO TABS: 15.0000 mg | ORAL_TABLET | Freq: Every day | ORAL | 0 refills | Status: DC

## 2017-10-03 NOTE — Progress Notes (Addendum)
Location:  [facility] Room Number: 710-G Place of Service:  SNF (31) Provider:  Durenda Age, NP  Patient Care Team: [name], PA-C as PCP - General (Family Medicine) Campbell Riches, MD as PCP - Infectious Diseases (Infectious Diseases) Fay Records, MD as PCP - Cardiology (Cardiology) Susette Racer, MD (Endocrinology)  Extended Emergency Contact Information Primary Emergency Contact: No,Contact Mobile Phone: [phone] Relation: Other Secondary Emergency Contact: Lanell Persons Home Phone: [phone] Relation: Other Preferred language: English Interpreter needed? No  Code Status:  DNR  Goals of care: Advanced Directive information Advanced Directives 10/03/2017  Does Patient Have a Medical Advance Directive? Yes  Type of Advance Directive Out of facility DNR (pink MOST or yellow form)  Does patient want to make changes to medical advance directive? No - Patient declined  Copy of [place] in Chart? No - copy requested  Would patient like information on creating a medical advance directive? -  Pre-existing out of facility DNR order (yellow form or pink MOST form) -     Chief Complaint  Patient presents with  . Discharge Note    Patient is seen for a discharge visit, is discharging home on 10/04/17.    HPI:  Pt is a 52 y.o. female seen today for a discharge visit.  She is discharging home on 10/04/17 with home health PT, OT, and Nursing for medication management.  She has been admitted to [facility] on 09/09/17 from a recent hospitalization due to a syncopal episode at home. She was unable to get up and stayed on the floor for 24 to 48 hours until she was found by her daughter-in-law and was brought to the hospital. Etiology was unclear.  CT head and cervical spine without acute findings.  EKG on admission showed A. fib, repeat EKG showed atrial flutter but cardiology indicated that it was  an artifact.  She was counseled not to drive for 6 months.  Acute encephalopathy was thought to be due to polypharmacy.  CT head without acute findings.  Ammonia level normal.  There was some concern for substance abuse as per history from son.  She apparently lives at home with a calcium who provides her with alcohol and prescription medications purchased on the street but patient denies it.  She was diagnosed with UTI Proteus mirabilis.  She was treated with IV ceftriaxone and discharged on Keflex for additional 5 days to complete a total of 7 days.  She was a started on Lantus, mealtime NovoLog and SSI due to poorly controlled diabetes mellitus type 2.  Patient was admitted to this facility for short-term rehabilitation after the patient's recent hospitalization.  Patient has completed SNF rehabilitation and therapy has cleared the patient for discharge.   Past Medical History:  Diagnosis Date  . Acute metabolic encephalopathy 0/35/0093  . Acute on chronic respiratory failure with hypoxia ([place]) 05/19/2016  . Anxiety   . ARF (acute renal failure) ([place]) 05/03/2014  . Arthritis    LEFT HIP  . Asthma    HOSPITALIZED WITH EXCERBATION OF ASTHMA - AND BRONCHITIS AND THE FLU DEC 2013  . Asthma exacerbation attacks 12/28/2011  . Bronchitis 12/28/2011  . CAP (community acquired pneumonia) 07/25/2012  . Chronic kidney disease (CKD), stage IV (severe) ([place]) 11/10/2014  . Class 3 obesity due to excess calories with serious comorbidity and body mass index (BMI) of 40.0 to 44.9 in adult   . Colles' fracture of left radius   . COPD (  chronic obstructive pulmonary disease) ([place])   . Depression   . Diabetes mellitus    ON INSULIN AND ORAL MEDICATIONS  . Diabetes mellitus type 2 with complications, uncontrolled ([place]) 04/19/2008   Qualifier: Diagnosis of  By: Tomma Lightning MD, Claiborne Billings     . Diabetic foot infection ([place]) 11/09/2014  . Diabetic ketoacidosis without coma associated with type 2 diabetes mellitus ([place])   .  Diabetic neuropathy ([place]) 11/10/2014  . Diarrhea 08/30/2008   Qualifier: Diagnosis of  By: Tomma Lightning MD, Claiborne Billings    . Diastolic dysfunction 45/04/979  . Difficult intravenous access   . DKA, type 2 ([place]) 04/04/2016  . Dyslipidemia 12/23/2008   Qualifier: Diagnosis of  By: Tomma Lightning MD, Claiborne Billings    . Essential hypertension 04/19/2008   Qualifier: Diagnosis of  By: Tomma Lightning MD, Claiborne Billings    . FATIGUE 07/12/2008   Qualifier: Diagnosis of  By: Tomma Lightning MD, Claiborne Billings    . Femoral neck fracture ([place]) 07/16/2011  . Femur fracture, left ([place]) 07/08/2014  . Fever   . Fracture of distal femur ([place]) 07/08/2014  . GERD (gastroesophageal reflux disease)   . Hereditary and idiopathic peripheral neuropathy 04/19/2008   Qualifier: Diagnosis of  By: Tomma Lightning MD, Claiborne Billings    . Hip fracture, left ([place]) 07/16/2011  . Hip pain   . HIV infection ([place]) 2000  . Human immunodeficiency virus (HIV) disease ([place]) 04/19/2008   [id number] +   . Hyperlipidemia   . Hypertension   . Hyponatremia 12/29/2011  . Influenza A 12/29/2011  . Influenza B   . Insomnia 06/12/2016  . Left hip pain 05/03/2014  . Mood disorder ([place]) 04/19/2008   Qualifier: Diagnosis of  By: Tomma Lightning MD, Claiborne Billings    . Neuropathy    NEUROPATHY HANDS AND FEET  . NSTEMI (non-ST elevated myocardial infarction) ([place]) 05/19/2016  . Obesity hypoventilation syndrome ([place])   . Osteoporosis 07/08/2014  . Pain    SEVERE PAIN LEFT HIP - HX OF LEFT HIP PINNING JULY 2013  . Perimenopausal symptoms 02/13/2012   LMP around 2011. On estrace and provera since around 2012 for hot flashes, mood swings. Estrace 2 mg daily, Provera 2.5 mg for 5 days each month.   . Repeated falls 07/30/2011  . Sepsis ([place]) 04/04/2016  . Shortness of breath    ALLERGIES ARE "ACTING UP"  . SOB (shortness of breath)   . THRUSH 05/20/2008   Qualifier: Diagnosis of  By: Tomma Lightning MD, Claiborne Billings      . Tobacco use disorder 04/14/2010   Past Surgical History:  Procedure Laterality Date  . AMPUTATION Left 11/18/2014   Procedure:  LEFT BELOW KNEE AMPUTATION;  Surgeon: Leandrew Koyanagi, MD;  Location: [facility];  Service: Orthopedics;  Laterality: Left;  . CAST APPLICATION Left 01/10/1476   Procedure: CAST APPLICATION (FIBERGLASS);  Surgeon: Latanya Maudlin, MD;  [facility];  Service: Orthopedics;  Laterality: Left;  CLOSED REDUCTION OF LEFT COLLES FRACTURE WITH SHORT ARM CAST  . HARDWARE REMOVAL Left 05/05/2014   Procedure: HARDWARE REMOVAL LEFT HIP;  Surgeon: Latanya Maudlin, MD;  [facility];  Service: Orthopedics;  Laterality: Left;  REMOVAL BIOMET 6.5-8.0 CANNULATED SCREW  . HIP ARTHROPLASTY Left 05/05/2014   Procedure: ARTHROPLASTY OPEN REDUCTION INTERNAL FIXATION LEFT HIP AND REMOVAL OF TWO CANNULATED SCREW;  Surgeon: Latanya Maudlin, MD;  [facility];  Service: Orthopedics;  Laterality: Left;  . HIP PINNING,CANNULATED  07/16/2011   Procedure: CANNULATED HIP PINNING;  Surgeon: Gearlean Alf, MD;  [facility];  Service: Orthopedics;  Laterality: Left;  . HIP PINNING,CANNULATED Left 04/09/2012   Procedure: CANNULATED HIP PINNING AND HARDWARE REVISION;  Surgeon: Gearlean Alf, MD;  [facility];  Service: Orthopedics;  Laterality: Left;  . I&D EXTREMITY Left 11/16/2014   Procedure:  DEBRIDEMENT OF LEFT FOOT POSSIBLE APPLICATION OF INTEGRIA AND VAC ;  Surgeon: Irene Limbo, MD;  Location: [facility];  Service: Plastics;  Laterality: Left;  . PERIPHERAL VASCULAR CATHETERIZATION N/A 11/15/2014   Procedure: Abdominal Aortogram;  Surgeon: Conrad [place], MD;  Location: [facility];  Service: Cardiovascular;  Laterality: N/A;  . PERIPHERAL VASCULAR CATHETERIZATION  11/15/2014   Procedure: Lower Extremity Angiography;  Surgeon: Conrad [place], MD;  Location: [facility];  Service: Cardiovascular;;  . PERIPHERAL VASCULAR CATHETERIZATION Left 11/15/2014   Procedure: Peripheral Vascular Intervention;  Surgeon: Conrad Woodruff, MD;  Location: [facility];  Service: Cardiovascular;  Laterality: Left;  popliteal  artery stenting    Allergies  Allergen Reactions  . Cortizone-10 [Hydrocortisone] Rash    Per patient, given local injection at knee and developed rash at local site.     Outpatient Encounter Medications as of 10/03/2017  Medication Sig  . albuterol (PROVENTIL HFA;VENTOLIN HFA) 108 (90 Base) MCG/ACT inhaler INHALE 2 PUFFS INTO THE LUNGS EVERY 6 HOURS AS NEEDED FOR WHEEZING OR SHORTNESS OF BREATH  . ARIPiprazole (ABILIFY) 15 MG tablet Take 15 mg by mouth daily.   . bictegravir-emtricitabine-tenofovir AF (BIKTARVY) 50-200-25 MG TABS tablet Take 1 tablet by mouth daily.  [name] Kitchen BREO ELLIPTA 100-25 MCG/INH AEPB INHALE 1 PUFF INTO THE LUNGS DAILY  . clonazePAM (KLONOPIN) 0.5 MG tablet Take 0.5 tablets (0.25 mg total) by mouth 2 (two) times daily as needed for anxiety.  . diphenoxylate-atropine (LOMOTIL) 2.5-0.025 MG tablet Take 1 tablet by mouth 4 (four) times daily as needed for diarrhea or loose stools.  . doxepin (SINEQUAN) 10 MG capsule Take 10 mg by mouth at bedtime.   . fenofibrate (TRICOR) 145 MG tablet Take 145 mg by mouth daily.   . insulin aspart (NOVOLOG) 100 UNIT/ML injection Inject 12 Units into the skin 3 (three) times daily with meals.  . insulin aspart (NOVOLOG) 100 UNIT/ML injection Inject 2-5 Units into the skin at bedtime. SSI:  201-250 = 2 units, 251-300 = 3 units, 301-350 = 4 units, 351-400 = 5 units, >400 call provider  . insulin aspart (NOVOLOG) cartridge Inject 0-15 Units into the skin 3 (three) times daily before meals. SSI:  150-200 = 3 units, 201-250 = 6 units, 251-300 = 9 units, 301-350 = 12 units, 351-400 = 15 units  . insulin glargine (LANTUS) 100 UNIT/ML injection Inject 0.45 mLs (45 Units total) into the skin 2 (two) times daily.  [name] Kitchen MAGNESIUM-OXIDE 400 (241.3 Mg) MG tablet Take 400 mg by mouth daily.   . Multiple Vitamin (MULTIVITAMIN WITH MINERALS) TABS tablet Take 1 tablet by mouth daily.  . nicotine (NICODERM CQ - DOSED IN MG/24 HOURS) 21 mg/24hr patch Place 1 patch  (21 mg total) onto the skin daily.  . pantoprazole (PROTONIX) 40 MG tablet Take 40 mg by mouth daily.  . pregabalin (LYRICA) 100 MG capsule Take 1 capsule (100 mg total) by mouth 2 (two) times daily.  . rosuvastatin (CRESTOR) 20 MG tablet Take 20 mg by mouth daily. Reported on 05/02/2015  . temazepam (RESTORIL) 7.5 MG capsule Take 1 capsule (7.5 mg total) by mouth at bedtime as needed for sleep.  . [DISCONTINUED] insulin aspart (NOVOLOG) 100 UNIT/ML injection  Inject 0-5 Units into the skin at bedtime. Correction coverage: HS scale CBG < 70: implement hypoglycemia protocol CBG 70 - 120: 0 units CBG 121 - 150: 0 units CBG 151 - 200: 0 units CBG 201 - 250: 2 units CBG 251 - 300: 3 units CBG 301 - 350: 4 units CBG 351 - 400: 5 units CBG > 400: call MD.   No facility-administered encounter medications on file as of 10/03/2017.     Review of Systems  GENERAL: No change in appetite, no fatigue, no fever, chills or weakness MOUTH and THROAT: Denies oral discomfort, gingival pain or bleeding, pain from teeth or hoarseness   RESPIRATORY: no cough, SOB, DOE, wheezing, hemoptysis CARDIAC: No chest pain, edema or palpitations GI: No abdominal pain, diarrhea, constipation, heart burn, nausea or vomiting GU: Denies dysuria, frequency, hematuria, incontinence, or discharge PSYCHIATRIC: Denies feelings of depression or anxiety. No report of hallucinations, insomnia, paranoia, or agitation   Immunization History  Administered Date(s) Administered  . Hepatitis B 07/06/1999, 08/06/1999, 11/19/2001  . Hepatitis B, adult 06/08/2013, 07/14/2013, 12/23/2013  . Hepatitis B, ped/adol 07/06/1999, 08/06/1999, 11/19/2001, 06/08/2013, 07/14/2013, 12/23/2013  . Influenza Split 10/17/2010  . Influenza Whole 11/17/2003, 10/10/2007, 11/28/2009  . Influenza, Seasonal, Injecte, Preservative Fre 01/06/2013, 10/14/2013, 12/23/2013  . Influenza,inj,Quad PF,6+ Mos 01/06/2013, 12/23/2013, 10/11/2014, 11/20/2016  .  Influenza,trivalent, recombinat, inj, PF 11/01/2012  . Pneumococcal Conjugate-13 05/21/2017  . Pneumococcal Polysaccharide-23 02/20/2007, 04/22/2012  . Tdap 05/01/2012   Pertinent  Health Maintenance Due  Topic Date Due  . PAP SMEAR  02/26/2007  . MAMMOGRAM  03/30/2014  . COLONOSCOPY  03/30/2014  . OPHTHALMOLOGY EXAM  09/03/2014  . FOOT EXAM  11/08/2015  . URINE MICROALBUMIN  03/05/2017  . INFLUENZA VACCINE  08/01/2017  . HEMOGLOBIN A1C  01/29/2018   Fall Risk  05/21/2017 11/20/2016 06/12/2016 06/08/2015 10/11/2014  Falls in the past year? Yes Yes Yes No Yes  Number falls in past yr: 1 2 or more 2 or more - 1  Injury with Fall? No No Yes - Yes  Risk Factor Category  - High Fall Risk High Fall Risk - High Fall Risk  Risk for fall due to : - - History of fall(s);Impaired balance/gait;Impaired mobility - -  Follow up - - Falls evaluation completed;Falls prevention discussed - -      Vitals:   10/03/17 0922  BP: 97/60  Pulse: 84  Resp: 16  Temp: (!) 97.1 F (36.2 C)  TempSrc: Oral  SpO2: 90%  Weight: 246 lb 3.2 oz (111.7 kg)  Height: '5\' 3"'$  (1.6 m)   Body mass index is 43.61 kg/m.  Physical Exam  GENERAL APPEARANCE: Well nourished. In no acute distress. Morbidly obese SKIN:  Skin is warm and dry.  MOUTH and THROAT: Lips are without lesions. Oral mucosa is moist and without lesions. Tongue is normal in shape, size, and color and without lesions RESPIRATORY: Breathing is even & unlabored, BS CTAB, O2 at 3L/min via [place] CARDIAC: RRR, no murmur,no extra heart sounds, no edema GI: Abdomen soft, normal BS, no masses, no tenderness EXTREMITIES:  Able to move X 4 extremities, old left BKA                                                              NEUROLOGICAL:  There is no tremor. Speech is clear PSYCHIATRIC: Alert and oriented X 3. Affect and behavior are appropriate   Labs reviewed: 09/12/2017 WBC 12 hemoglobin 11.5 hematocrit 35.8 glucose 156 calcium 8.8 BUN 21.4 creatinine K4.2  eGFR 60.59 Recent Labs    09/04/17 0317  09/07/17 0143 09/08/17 0239 09/09/17 0321 09/12/17  NA 145   < > 135 139 138 144  K 2.9*   < > 3.9 3.8 3.7 4.2  CL 98   < > 95* 100 100  --   CO2 34*   < > _0 --   GLUCOSE 55*   < > 245* 94 268*  --   BUN 32*   < > 19 21* 21* 21  CREATININE 1.55*   < > 1.82* 1.70* 1.80* 1.1  CALCIUM 9.2   < > 8.8* 9.0 8.7*  --   MG 2.2  --   --   --   --   --    < > = values in this interval not displayed.   Recent Labs    08/26/17 1032 08/29/17 1222 09/04/17 1724  AST 18 75* 25  ALT 20 63* 33  ALKPHOS 60 86 82  BILITOT 0.7 2.0* 0.6  PROT 6.3* 6.6 5.8*  ALBUMIN 3.2* 3.2* 2.6*   Recent Labs    09/07/17 0143 09/08/17 0239 09/09/17 0321 09/12/17  WBC 25.4* 18.9* 15.0* 12.0  NEUTROABS 21.1* 13.9* 10.2* 8  HGB 12.8 12.3 11.7* 11.5*  HCT 40.1 38.8 37.5 36  MCV 88.9 89.0 91.2  --   PLT 358 409* 367 283   Lab Results  Component Value Date   TSH 1.806 08/30/2017   Lab Results  Component Value Date   HGBA1C 8.3 (H) 07/29/2017   Lab Results  Component Value Date   CHOL 250 (H) 05/21/2017   HDL 73 05/21/2017   LDLCALC 147 (H) 05/21/2017   TRIG 163 (H) 05/21/2017   CHOLHDL 3.4 05/21/2017    Significant Diagnostic Results in last 30 days:  Mr Brain Wo Contrast  Result Date: 09/05/2017 CLINICAL DATA:  Altered mental status. Syncopal episode. History of diabetes and HIV infection. EXAM: MRI HEAD WITHOUT CONTRAST TECHNIQUE: Multiplanar, multiecho pulse sequences of the brain and surrounding structures were obtained without intravenous contrast. COMPARISON:  Head CT 08/29/2017 FINDINGS: The examination had to be discontinued prior to completion due to patient pain. Axial diffusion, axial FLAIR, and susceptibility weighted imaging was performed. There is no restricted diffusion to indicate acute infarct. Susceptibility weighted imaging is moderately to severely motion degraded without gross hemorrhage identified. Mild cerebral atrophy is  advanced for age. Patchy cerebral white matter T2 hyperintensities are moderately advanced for age and nonspecific but compatible with chronic small vessel ischemic disease. No intracranial mass effect or extra-axial fluid collection is evident. IMPRESSION: 1. Incomplete examination. 2. No acute infarct. 3. Age advanced chronic small vessel ischemic disease and cerebral atrophy. Electronically Signed   By: Logan Bores M.D.   On: 09/05/2017 22:27   Dg Swallowing Func-speech Pathology  Result Date: 09/09/2017 Objective Swallowing Evaluation: Type of Study: MBS-Modified Barium Swallow Study  Patient Details Name: YOLANDRA HABIG MRN: [id number] Date of Birth: [birth year] Today's Date: 09/09/2017 Time: SLP Start Time (ACUTE ONLY): 0940 -SLP Stop Time (ACUTE ONLY): 0956 SLP Time Calculation (min) (ACUTE ONLY): 16 min Past Medical History: Past Medical History: Diagnosis Date . Acute metabolic encephalopathy 2/94/2627 . Acute on chronic respiratory failure with hypoxia ([place]) 05/19/2016 . Anxiety  . ARF (  acute renal failure) ([place]) 05/03/2014 . Arthritis   LEFT HIP . Asthma   HOSPITALIZED WITH EXCERBATION OF ASTHMA - AND BRONCHITIS AND THE FLU DEC 2013 . Asthma exacerbation attacks 12/28/2011 . Bronchitis 12/28/2011 . CAP (community acquired pneumonia) 07/25/2012 . Chronic kidney disease (CKD), stage IV (severe) ([place]) 11/10/2014 . Class 3 obesity due to excess calories with serious comorbidity and body mass index (BMI) of 40.0 to 44.9 in adult  . Colles' fracture of left radius  . COPD (chronic obstructive pulmonary disease) ([place])  . Depression  . Diabetes mellitus   ON INSULIN AND ORAL MEDICATIONS . Diabetes mellitus type 2 with complications, uncontrolled ([place]) 04/19/2008  Qualifier: Diagnosis of  By: Tomma Lightning MD, Claiborne Billings    . Diabetic foot infection ([place]) 11/09/2014 . Diabetic ketoacidosis without coma associated with type 2 diabetes mellitus ([place])  . Diabetic neuropathy ([place]) 11/10/2014 . Diarrhea 08/30/2008  Qualifier: Diagnosis  of  By: Tomma Lightning MD, Claiborne Billings   . Diastolic dysfunction 73/04/1936 . Difficult intravenous access  . DKA, type 2 ([place]) 04/04/2016 . Dyslipidemia 12/23/2008  Qualifier: Diagnosis of  By: Tomma Lightning MD, Claiborne Billings   . Essential hypertension 04/19/2008  Qualifier: Diagnosis of  By: Tomma Lightning MD, Claiborne Billings   . FATIGUE 07/12/2008  Qualifier: Diagnosis of  By: Tomma Lightning MD, Claiborne Billings   . Femoral neck fracture ([place]) 07/16/2011 . Femur fracture, left ([place]) 07/08/2014 . Fever  . Fracture of distal femur ([place]) 07/08/2014 . GERD (gastroesophageal reflux disease)  . Hereditary and idiopathic peripheral neuropathy 04/19/2008  Qualifier: Diagnosis of  By: Tomma Lightning MD, Claiborne Billings   . Hip fracture, left ([place]) 07/16/2011 . Hip pain  . HIV infection ([place]) 2000 . Human immunodeficiency virus (HIV) disease ([place]) 04/19/2008  [id number] +  . Hyperlipidemia  . Hypertension  . Hyponatremia 12/29/2011 . Influenza A 12/29/2011 . Influenza B  . Insomnia 06/12/2016 . Left hip pain 05/03/2014 . Mood disorder ([place]) 04/19/2008  Qualifier: Diagnosis of  By: Tomma Lightning MD, Claiborne Billings   . Neuropathy   NEUROPATHY HANDS AND FEET . NSTEMI (non-ST elevated myocardial infarction) ([place]) 05/19/2016 . Obesity hypoventilation syndrome ([place])  . Osteoporosis 07/08/2014 . Pain   SEVERE PAIN LEFT HIP - HX OF LEFT HIP PINNING JULY 2013 . Perimenopausal symptoms 02/13/2012  LMP around 2011. On estrace and provera since around 2012 for hot flashes, mood swings. Estrace 2 mg daily, Provera 2.5 mg for 5 days each month.  . Repeated falls 07/30/2011 . Sepsis ([place]) 04/04/2016 . Shortness of breath   ALLERGIES ARE "ACTING UP" . SOB (shortness of breath)  . THRUSH 05/20/2008  Qualifier: Diagnosis of  By: Tomma Lightning MD, Claiborne Billings     . Tobacco use disorder 04/14/2010 Past Surgical History: Past Surgical History: Procedure Laterality Date . AMPUTATION Left 11/18/2014  Procedure: LEFT BELOW KNEE AMPUTATION;  Surgeon: Leandrew Koyanagi, MD;  Location: [facility];  Service: Orthopedics;  Laterality: Left; . CAST APPLICATION Left 9/0/2409  Procedure: CAST  APPLICATION (FIBERGLASS);  Surgeon: Latanya Maudlin, MD;  [facility];  Service: Orthopedics;  Laterality: Left;  CLOSED REDUCTION OF LEFT COLLES FRACTURE WITH SHORT ARM CAST . HARDWARE REMOVAL Left 05/05/2014  Procedure: HARDWARE REMOVAL LEFT HIP;  Surgeon: Latanya Maudlin, MD;  [facility];  Service: Orthopedics;  Laterality: Left;  REMOVAL BIOMET 6.5-8.0 CANNULATED SCREW . HIP ARTHROPLASTY Left 05/05/2014  Procedure: ARTHROPLASTY OPEN REDUCTION INTERNAL FIXATION LEFT HIP AND REMOVAL OF TWO CANNULATED SCREW;  Surgeon: Latanya Maudlin, MD;  [facility];  Service: Orthopedics;  Laterality: Left; . HIP PINNING,CANNULATED  07/16/2011  Procedure: CANNULATED HIP PINNING;  Surgeon: Gearlean Alf, MD;  [facility];  Service: Orthopedics;  Laterality: Left; . HIP PINNING,CANNULATED Left 04/09/2012  Procedure: CANNULATED HIP PINNING AND HARDWARE REVISION;  Surgeon: Gearlean Alf, MD;  [facility];  Service: Orthopedics;  Laterality: Left; . I&D EXTREMITY Left 11/16/2014  Procedure:  DEBRIDEMENT OF LEFT FOOT POSSIBLE APPLICATION OF INTEGRIA AND VAC ;  Surgeon: Irene Limbo, MD;  Location: [facility];  Service: Plastics;  Laterality: Left; . PERIPHERAL VASCULAR CATHETERIZATION N/A 11/15/2014  Procedure: Abdominal Aortogram;  Surgeon: Conrad [place], MD;  Location: [facility];  Service: Cardiovascular;  Laterality: N/A; . PERIPHERAL VASCULAR CATHETERIZATION  11/15/2014  Procedure: Lower Extremity Angiography;  Surgeon: Conrad [place], MD;  Location: [facility];  Service: Cardiovascular;; . PERIPHERAL VASCULAR CATHETERIZATION Left 11/15/2014  Procedure: Peripheral Vascular Intervention;  Surgeon: Conrad [place], MD;  Location: [facility];  Service: Cardiovascular;  Laterality: Left;  popliteal artery stenting HPI: [name] is a 52 y.o. female with medical history significant of COPD, type 2 diabetes mellitus, HIV, diastolic heart failure, dyslipidemia and chronic pain syndrome.   Patient was discharged from the hospital August 27 after being treated for COPD exacerbation.  Apparently she lives at home by herself.  She reported experiencing a syncope episode in her bedroom, unclear about prodromal symptoms, but apparently after recovering her consciousness she was not able to stand.  She had a left BKA and uses a prosthesis.  At the time of her fall she was not using the prosthesis.  She spent about 24 to 48 hours on the floor, until she was found by her daughter-in-law who brought her to the hospital. hos been observed to cough while eating. no SLP history in chart.  No data recorded Assessment / Plan / Recommendation CHL IP CLINICAL IMPRESSIONS 09/09/2017 Clinical Impression Pt demonstrated much improved swallow function, mentation, cooperativeness and ability to follow commands with min cues. She presented with mild oropharyngeal dysphagia marked by trace flash penetration and mild intermittant oral holding with thin liquids. Mild-mod pharyngeal residue in valleculae and pyriform sinuses observed across consistencies (thin, nectar, solid), although swallows of secretions in between trials were effective to reduce residue. Delayed yet functional swallow initiation most consitently triggered at pyriforms. Flash penetration above vocal folds during swallow observed X2 thin, and X1 dual consistency. Flash penetrations likely due to decreased timing and coordination of swallow initiation with thin. Given absence of aspiration and markedly improved swallow function in comparison to 9/3 MBS, recommend upgrade to regular texture diet and thin liquids, small bites and sips, multiple dry swallows to clear residue, continue medications whole in applesauce. ST will follow up to montior diet tolerance and use of compensatory strategies.  SLP Visit Diagnosis Dysphagia, oropharyngeal phase (R13.12) Attention and concentration deficit following -- Frontal lobe and executive function deficit following --  Impact on safety and function --   CHL IP TREATMENT RECOMMENDATION 09/09/2017 Treatment Recommendations Therapy as outlined in treatment plan below   Prognosis 09/09/2017 Prognosis for Safe Diet Advancement -- Barriers to Reach Goals Cognitive deficits Barriers/Prognosis Comment -- CHL IP DIET RECOMMENDATION 09/09/2017 SLP Diet Recommendations Regular solids;Thin liquid Liquid Administration via Straw;Cup Medication Administration Whole meds with puree Compensations Minimize environmental distractions;Slow rate;Small sips/bites;Multiple dry swallows after each bite/sip Postural Changes Seated upright at 90 degrees   CHL IP OTHER RECOMMENDATIONS 09/09/2017 Recommended Consults -- Oral Care Recommendations Oral care BID Other Recommendations --   CHL IP FOLLOW  UP RECOMMENDATIONS 09/09/2017 Follow up Recommendations Skilled Nursing facility   [facility] IP FREQUENCY AND DURATION 09/09/2017 Speech Therapy Frequency (ACUTE ONLY) min 1 x/week Treatment Duration 2 weeks      CHL IP ORAL PHASE 09/09/2017 Oral Phase WFL Oral - Pudding Teaspoon -- Oral - Pudding Cup -- Oral - Honey Teaspoon -- Oral - Honey Cup -- Oral - Nectar Teaspoon -- Oral - Nectar Cup Holding of bolus;WFL Oral - Nectar Straw -- Oral - Thin Teaspoon -- Oral - Thin Cup -- Oral - Thin Straw -- Oral - Puree -- Oral - Mech Soft -- Oral - Regular -- Oral - Multi-Consistency -- Oral - Pill -- Oral Phase - Comment --  CHL IP PHARYNGEAL PHASE 09/09/2017 Pharyngeal Phase Impaired Pharyngeal- Pudding Teaspoon -- Pharyngeal -- Pharyngeal- Pudding Cup -- Pharyngeal -- Pharyngeal- Honey Teaspoon -- Pharyngeal -- Pharyngeal- Honey Cup -- Pharyngeal -- Pharyngeal- Nectar Teaspoon -- Pharyngeal -- Pharyngeal- Nectar Cup WFL Pharyngeal -- Pharyngeal- Nectar Straw NT Pharyngeal -- Pharyngeal- Thin Teaspoon -- Pharyngeal -- Pharyngeal- Thin Cup Delayed swallow initiation-pyriform sinuses;Pharyngeal residue - valleculae;Pharyngeal residue - pyriform;Penetration/Aspiration during swallow Pharyngeal  Material enters airway, remains ABOVE vocal cords then ejected out;Material enters airway, remains ABOVE vocal cords and not ejected out Pharyngeal- Thin Straw Delayed swallow initiation-pyriform sinuses;Pharyngeal residue - valleculae;Pharyngeal residue - pyriform;Penetration/Aspiration during swallow Pharyngeal Material enters airway, remains ABOVE vocal cords then ejected out Pharyngeal- Puree -- Pharyngeal -- Pharyngeal- Mechanical Soft -- Pharyngeal -- Pharyngeal- Regular WFL Pharyngeal -- Pharyngeal- Multi-consistency -- Pharyngeal -- Pharyngeal- Pill -- Pharyngeal -- Pharyngeal Comment --  CHL IP CERVICAL ESOPHAGEAL PHASE 09/09/2017 Cervical Esophageal Phase WFL Pudding Teaspoon -- Pudding Cup -- Honey Teaspoon -- Honey Cup -- Nectar Teaspoon -- Nectar Cup -- Nectar Straw -- Thin Teaspoon -- Thin Cup -- Thin Straw -- Puree -- Mechanical Soft -- Regular -- Multi-consistency -- Pill -- Cervical Esophageal Comment -- [place] Siren 09/09/2017, 2:55 PM  Orbie Pyo Litaker M.Ed CCC-SLP Pager [phone]             Dg Swallowing Func-speech Pathology  Result Date: 09/03/2017 Objective Swallowing Evaluation: Type of Study: MBS-Modified Barium Swallow Study  Patient Details Name: [name] MRN: [id number] Date of Birth: 1965/01/06 Today's Date: 09/03/2017 Time: SLP Start Time (ACUTE ONLY): 1405 -SLP Stop Time (ACUTE ONLY): 1417 SLP Time Calculation (min) (ACUTE ONLY): 12 min Past Medical History: Past Medical History: Diagnosis Date . Acute metabolic encephalopathy 1/54/0086 . Acute on chronic respiratory failure with hypoxia ([place]) 05/19/2016 . Anxiety  . ARF (acute renal failure) ([place]) 05/03/2014 . Arthritis   LEFT HIP . Asthma   HOSPITALIZED WITH EXCERBATION OF ASTHMA - AND BRONCHITIS AND THE FLU DEC 2013 . Asthma exacerbation attacks 12/28/2011 . Bronchitis 12/28/2011 . CAP (community acquired pneumonia) 07/25/2012 . Chronic kidney disease (CKD), stage IV (severe) ([place]) 11/10/2014 . Class 3 obesity due to  excess calories with serious comorbidity and body mass index (BMI) of 40.0 to 44.9 in adult  . Colles' fracture of left radius  . COPD (chronic obstructive pulmonary disease) ([place])  . Depression  . Diabetes mellitus   ON INSULIN AND ORAL MEDICATIONS . Diabetes mellitus type 2 with complications, uncontrolled ([place]) 04/19/2008  Qualifier: Diagnosis of  By: Tomma Lightning MD, Claiborne Billings    . Diabetic foot infection ([place]) 11/09/2014 . Diabetic ketoacidosis without coma associated with type 2 diabetes mellitus ([place])  . Diabetic neuropathy ([place]) 11/10/2014 . Diarrhea 08/30/2008  Qualifier: Diagnosis of  By: Tomma Lightning MD, Claiborne Billings   . Diastolic  dysfunction 11/10/2014 . Difficult intravenous access  . DKA, type 2 ([place]) 04/04/2016 . Dyslipidemia 12/23/2008  Qualifier: Diagnosis of  By: Tomma Lightning MD, Claiborne Billings   . Essential hypertension 04/19/2008  Qualifier: Diagnosis of  By: Tomma Lightning MD, Claiborne Billings   . FATIGUE 07/12/2008  Qualifier: Diagnosis of  By: Tomma Lightning MD, Claiborne Billings   . Femoral neck fracture ([place]) 07/16/2011 . Femur fracture, left ([place]) 07/08/2014 . Fever  . Fracture of distal femur ([place]) 07/08/2014 . GERD (gastroesophageal reflux disease)  . Hereditary and idiopathic peripheral neuropathy 04/19/2008  Qualifier: Diagnosis of  By: Tomma Lightning MD, Claiborne Billings   . Hip fracture, left ([place]) 07/16/2011 . Hip pain  . HIV infection ([place]) 2000 . Human immunodeficiency virus (HIV) disease ([place]) 04/19/2008  [id number] +  . Hyperlipidemia  . Hypertension  . Hyponatremia 12/29/2011 . Influenza A 12/29/2011 . Influenza B  . Insomnia 06/12/2016 . Left hip pain 05/03/2014 . Mood disorder ([place]) 04/19/2008  Qualifier: Diagnosis of  By: Tomma Lightning MD, Claiborne Billings   . Neuropathy   NEUROPATHY HANDS AND FEET . NSTEMI (non-ST elevated myocardial infarction) ([place]) 05/19/2016 . Obesity hypoventilation syndrome ([place])  . Osteoporosis 07/08/2014 . Pain   SEVERE PAIN LEFT HIP - HX OF LEFT HIP PINNING JULY 2013 . Perimenopausal symptoms 02/13/2012  LMP around 2011. On estrace and provera since around 2012 for hot flashes,  mood swings. Estrace 2 mg daily, Provera 2.5 mg for 5 days each month.  . Repeated falls 07/30/2011 . Sepsis ([place]) 04/04/2016 . Shortness of breath   ALLERGIES ARE "ACTING UP" . SOB (shortness of breath)  . THRUSH 05/20/2008  Qualifier: Diagnosis of  By: Tomma Lightning MD, Claiborne Billings     . Tobacco use disorder 04/14/2010 Past Surgical History: Past Surgical History: Procedure Laterality Date . AMPUTATION Left 11/18/2014  Procedure: LEFT BELOW KNEE AMPUTATION;  Surgeon: Leandrew Koyanagi, MD;  Location: [facility];  Service: Orthopedics;  Laterality: Left; . CAST APPLICATION Left 03/06/1441  Procedure: CAST APPLICATION (FIBERGLASS);  Surgeon: Latanya Maudlin, MD;  Location: [facility];  Service: Orthopedics;  Laterality: Left;  CLOSED REDUCTION OF LEFT COLLES FRACTURE WITH SHORT ARM CAST . HARDWARE REMOVAL Left 05/05/2014  Procedure: HARDWARE REMOVAL LEFT HIP;  Surgeon: Latanya Maudlin, MD;  Location: [facility];  Service: Orthopedics;  Laterality: Left;  REMOVAL BIOMET 6.5-8.0 CANNULATED SCREW . HIP ARTHROPLASTY Left 05/05/2014  Procedure: ARTHROPLASTY OPEN REDUCTION INTERNAL FIXATION LEFT HIP AND REMOVAL OF TWO CANNULATED SCREW;  Surgeon: Latanya Maudlin, MD;  Location: [facility];  Service: Orthopedics;  Laterality: Left; . HIP PINNING,CANNULATED  07/16/2011  Procedure: CANNULATED HIP PINNING;  Surgeon: Gearlean Alf, MD;  Location: [facility];  Service: Orthopedics;  Laterality: Left; . HIP PINNING,CANNULATED Left 04/09/2012  Procedure: CANNULATED HIP PINNING AND HARDWARE REVISION;  Surgeon: Gearlean Alf, MD;  Location: [facility];  Service: Orthopedics;  Laterality: Left; . I&D EXTREMITY Left 11/16/2014  Procedure:  DEBRIDEMENT OF LEFT FOOT POSSIBLE APPLICATION OF INTEGRIA AND VAC ;  Surgeon: Irene Limbo, MD;  Location: [facility];  Service: Plastics;  Laterality: Left; . PERIPHERAL VASCULAR CATHETERIZATION N/A 11/15/2014  Procedure: Abdominal Aortogram;  Surgeon: Conrad [place], MD;  Location: [facility];  Service: Cardiovascular;  Laterality: N/A; .  PERIPHERAL VASCULAR CATHETERIZATION  11/15/2014  Procedure: Lower Extremity Angiography;  Surgeon: Conrad [place], MD;  Location: [facility];  Service: Cardiovascular;; . PERIPHERAL VASCULAR CATHETERIZATION Left 11/15/2014  Procedure: Peripheral Vascular Intervention;  Surgeon: Conrad [place], MD;  Location: [facility];  Service: Cardiovascular;  Laterality:  Left;  popliteal artery stenting HPI: JOVONNE WILTON is a 52 y.o. female with medical history significant of COPD, type 2 diabetes mellitus, HIV, diastolic heart failure, dyslipidemia and chronic pain syndrome.  Patient was discharged from the hospital August 27 after being treated for COPD exacerbation.  Apparently she lives at home by herself.  She reported experiencing a syncope episode in her bedroom, unclear about prodromal symptoms, but apparently after recovering her consciousness she was not able to stand.  She had a left BKA and uses a prosthesis.  At the time of her fall she was not using the prosthesis.  She spent about 24 to 48 hours on the floor, until she was found by her daughter-in-law who brought her to the hospital. hos been observed to cough while eating. no SLP history in chart.  No data recorded Assessment / Plan / Recommendation CHL IP CLINICAL IMPRESSIONS 09/03/2017 Clinical Impression Pharyngeal swallow was initiated at the pyriform sinuses consistently across liquid consistencies with intermittent penetration. Laryngeal closure incomplete allowing penetration of thin barium via straw onto pt's vocal cords without awareness with cued coughed mobilizing barium higher in vestibule but not clearing. Decreasing volume and velocity of bolus with straw removal was not effective and pt presently not reliable for assessing additional strategies due to awareness/alertness. It should be noted pt with frequent coughing at baseline prior to barium presentations and during study at times with questionable barium in vestibule. Minimal  vallecular and pyriform sinus residue cleared with additional swallows. Body habitus prevented esophageal scan. Safest recommendations are continuation of nectar thick liquids, straws allowed, Dys 3 texture, pills whole in puree and volitional cough with continued ST for intervention and appropriateness for upgrade.  SLP Visit Diagnosis Dysphagia, pharyngeal phase (R13.13) Attention and concentration deficit following -- Frontal lobe and executive function deficit following -- Impact on safety and function Moderate aspiration risk   CHL IP TREATMENT RECOMMENDATION 09/03/2017 Treatment Recommendations Therapy as outlined in treatment plan below   Prognosis 09/03/2017 Prognosis for Safe Diet Advancement Good Barriers to Reach Goals Cognitive deficits Barriers/Prognosis Comment -- CHL IP DIET RECOMMENDATION 09/03/2017 SLP Diet Recommendations Dysphagia 3 (Mech soft) solids;Nectar thick liquid Liquid Administration via Cup;Straw Medication Administration Whole meds with puree Compensations Slow rate;Small sips/bites;Clear throat intermittently Postural Changes Seated upright at 90 degrees   CHL IP OTHER RECOMMENDATIONS 09/03/2017 Recommended Consults -- Oral Care Recommendations Oral care BID Other Recommendations --   CHL IP FOLLOW UP RECOMMENDATIONS 09/03/2017 Follow up Recommendations Skilled Nursing facility   [facility] IP FREQUENCY AND DURATION 09/03/2017 Speech Therapy Frequency (ACUTE ONLY) min 2x/week Treatment Duration 2 weeks      CHL IP ORAL PHASE 09/03/2017 Oral Phase WFL Oral - Pudding Teaspoon -- Oral - Pudding Cup -- Oral - Honey Teaspoon -- Oral - Honey Cup -- Oral - Nectar Teaspoon -- Oral - Nectar Cup -- Oral - Nectar Straw -- Oral - Thin Teaspoon -- Oral - Thin Cup -- Oral - Thin Straw -- Oral - Puree -- Oral - Mech Soft -- Oral - Regular -- Oral - Multi-Consistency -- Oral - Pill -- Oral Phase - Comment --  CHL IP PHARYNGEAL PHASE 09/03/2017 Pharyngeal Phase Impaired Pharyngeal- Pudding Teaspoon -- Pharyngeal --  Pharyngeal- Pudding Cup -- Pharyngeal -- Pharyngeal- Honey Teaspoon -- Pharyngeal -- Pharyngeal- Honey Cup -- Pharyngeal -- Pharyngeal- Nectar Teaspoon -- Pharyngeal -- Pharyngeal- Nectar Cup Delayed swallow initiation-pyriform sinuses Pharyngeal -- Pharyngeal- Nectar Straw Delayed swallow initiation-pyriform sinuses Pharyngeal -- Pharyngeal- Thin Teaspoon -- Pharyngeal --  Pharyngeal- Thin Cup Delayed swallow initiation-pyriform sinuses;Penetration/Aspiration during swallow Pharyngeal Material enters airway, remains ABOVE vocal cords and not ejected out Pharyngeal- Thin Straw Penetration/Aspiration during swallow;Delayed swallow initiation-pyriform sinuses Pharyngeal Material enters airway, CONTACTS cords and not ejected out Pharyngeal- Puree -- Pharyngeal -- Pharyngeal- Mechanical Soft -- Pharyngeal -- Pharyngeal- Regular WFL Pharyngeal -- Pharyngeal- Multi-consistency -- Pharyngeal -- Pharyngeal- Pill -- Pharyngeal -- Pharyngeal Comment --  CHL IP CERVICAL ESOPHAGEAL PHASE 09/03/2017 Cervical Esophageal Phase WFL Pudding Teaspoon -- Pudding Cup -- Honey Teaspoon -- Honey Cup -- Nectar Teaspoon -- Nectar Cup -- Nectar Straw -- Thin Teaspoon -- Thin Cup -- Thin Straw -- Puree -- Mechanical Soft -- Regular -- Multi-consistency -- Pill -- Cervical Esophageal Comment -- [place] Siren 09/03/2017, 2:55 PM Orbie Pyo Litaker M.Ed CCC-SLP Pager [phone]               Assessment/Plan   1. Syncope, unspecified syncope type -  Etiology was unclear.  CT head and cervical spine without acute findings.  EKG on admission showed A. fib, repeat EKG showed atrial flutter but cardiology indicated that it was an artifact.  She was counseled not to drive for 6 months.  2. Human immunodeficiency virus (HIV) disease ([place]) - follow up with Dr. Bobby Rumpf, ID - bictegravir-emtricitabine-tenofovir AF (BIKTARVY) 50-200-25 MG TABS tablet; Take 1 tablet by mouth daily.  Dispense: 30 tablet; Refill: 0 - diphenoxylate-atropine  (LOMOTIL) 2.5-0.025 MG tablet; Take 1 tablet by mouth 4 (four) times daily as needed for diarrhea or loose stools.  Dispense: 60 tablet; Refill: 0  3. COPD exacerbation ([name]) - no wheezing, O2 at 3L/min, verbalized having O2 at home - albuterol (PROVENTIL HFA;VENTOLIN HFA) 108 (90 Base) MCG/ACT inhaler; INHALE 2 PUFFS INTO THE LUNGS EVERY 6 HOURS AS NEEDED FOR WHEEZING OR SHORTNESS OF BREATH  Dispense: 18 g; Refill: 0 - fluticasone furoate-vilanterol (BREO ELLIPTA) 100-25 MCG/INH AEPB; INHALE 1 PUFF INTO THE LUNGS DAILY  Dispense: 30 each; Refill: 0  4. Leukocytosis, unspecified type -  Thought to be from UTI and was put on Ceftriaxone and discharged on Keflex which has been completed Lab Results  Component Value Date   WBC 12.0 09/12/2017    5. Urinary tract infection without hematuria, site unspecified - was put on Ceftriaxone and then discharged on Keflex, which has been completed   6. Chronic diastolic CHF (congestive heart failure) ([name]) - euvolemic, stable   7. Tobacco abuse - nicotine (NICODERM CQ - DOSED IN MG/24 HOURS) 21 mg/24hr patch; Place 1 patch (21 mg total) onto the skin daily.  Dispense: 28 patch; Refill: 0  8. Bipolar 2 disorder, major depressive episode ([name])  - ARIPiprazole (ABILIFY) 15 MG tablet; Take 1 tablet (15 mg total) by mouth daily.  Dispense: 30 tablet; Refill: 0 - clonazePAM (KLONOPIN) 0.5 MG tablet; Take 0.5 tablets (0.25 mg total) by mouth 2 (two) times daily as needed for anxiety.  Dispense: 10 tablet; Refill: 0 - doxepin (SINEQUAN) 10 MG capsule; Take 1 capsule (10 mg total) by mouth at bedtime.  Dispense: 30 capsule; Refill: 0  9. Other chronic pain - follow-up [facility], Jeanella Anton - pregabalin (LYRICA) 100 MG capsule; Take 1 capsule (100 mg total) by mouth 2 (two) times daily.   10. Insomnia, unspecified type - temazepam (RESTORIL) 7.5 MG capsule; Take 1 capsule (7.5 mg total) by mouth at bedtime as needed for sleep.    11.  Hyperlipidemia, unspecified hyperlipidemia type - fenofibrate (TRICOR) 145 MG tablet; Take  1 tablet (145 mg total) by mouth daily.  Dispense: 30 tablet; Refill: 0 - rosuvastatin (CRESTOR) 20 MG tablet; Take 1 tablet (20 mg total) by mouth daily. Reported on 05/02/2015  Dispense: 30 tablet; Refill: 0  12. Diabetes mellitus type 2 with complications, uncontrolled ([place]) - follow-up with Dr. Elayne Snare, endocrinologist - insulin aspart (NOVOLOG) 100 UNIT/ML injection; Inject 12 Units into the skin 3 (three) times daily with meals.  Dispense: 10 mL; Refill: 0 - insulin aspart (NOVOLOG) cartridge; Inject 0-15 Units into the skin 3 (three) times daily before meals. SSI:  150-200 = 3 units, 201-250 = 6 units, 251-300 = 9 units, 301-350 = 12 units, 351-400 = 15 units  Dispense: 15 mL; Refill: 0 - insulin aspart (NOVOLOG) 100 UNIT/ML injection; Inject 2-5 Units into the skin at bedtime. SSI:  201-250 = 2 units, 251-300 = 3 units, 301-350 = 4 units, 351-400 = 5 units, >400 call provider  Dispense: 10 mL; Refill: 0 - insulin glargine (LANTUS) 100 UNIT/ML injection; Inject 0.45 mLs (45 Units total) into the skin 2 (two) times daily.  Dispense: 10 mL; Refill: 0 Lab Results  Component Value Date   HGBA1C 8.3 (H) 07/29/2017    13. Morbidly obese - counseled regarding weight loss   I have filled out patient's discharge paperwork and written prescriptions.  Patient will receive home health PT, OT, and Nursing.  DME provided:  Hospital bed, drop-arm commode, wheelchair  Total discharge time: Greater than 30 minutes Greater than 50% was spent in counseling and coordination of care.  Discharge time involved coordination of the discharge process with social worker, nursing staff and therapy department. Medical justification for home health services/DME verified.   Durenda Age, NP [facility] [phone] (Monday-Friday 8:00 a.m. - 5:00 p.m.) [phone] (after hours)

## 2017-10-07 ENCOUNTER — Other Ambulatory Visit: Payer: Self-pay | Admitting: *Deleted

## 2017-10-07 DIAGNOSIS — M5136 Other intervertebral disc degeneration, lumbar region: Secondary | ICD-10-CM | POA: Diagnosis not present

## 2017-10-07 DIAGNOSIS — G546 Phantom limb syndrome with pain: Secondary | ICD-10-CM | POA: Diagnosis not present

## 2017-10-07 DIAGNOSIS — F1120 Opioid dependence, uncomplicated: Secondary | ICD-10-CM | POA: Diagnosis not present

## 2017-10-07 DIAGNOSIS — G894 Chronic pain syndrome: Secondary | ICD-10-CM | POA: Diagnosis not present

## 2017-10-07 DIAGNOSIS — Z79899 Other long term (current) drug therapy: Secondary | ICD-10-CM | POA: Diagnosis not present

## 2017-10-07 MED ORDER — PREGABALIN 100 MG PO CAPS: 100.0000 mg | ORAL_CAPSULE | Freq: Two times a day (BID) | ORAL | 1 refills | Status: DC

## 2017-10-07 NOTE — Telephone Encounter (Signed)
Refills sent to the pharmacy and message left for the patient as she did not answer her phone.

## 2017-10-07 NOTE — Telephone Encounter (Signed)
Ok to fill lyrica thanks

## 2017-10-14 ENCOUNTER — Other Ambulatory Visit: Payer: Self-pay

## 2017-10-15 DIAGNOSIS — F419 Anxiety disorder, unspecified: Secondary | ICD-10-CM | POA: Diagnosis not present

## 2017-10-15 DIAGNOSIS — M545 Low back pain: Secondary | ICD-10-CM | POA: Diagnosis not present

## 2017-10-15 DIAGNOSIS — I130 Hypertensive heart and chronic kidney disease with heart failure and stage 1 through stage 4 chronic kidney disease, or unspecified chronic kidney disease: Secondary | ICD-10-CM | POA: Diagnosis not present

## 2017-10-15 DIAGNOSIS — E1122 Type 2 diabetes mellitus with diabetic chronic kidney disease: Secondary | ICD-10-CM | POA: Diagnosis not present

## 2017-10-15 DIAGNOSIS — G894 Chronic pain syndrome: Secondary | ICD-10-CM | POA: Diagnosis not present

## 2017-10-15 DIAGNOSIS — N183 Chronic kidney disease, stage 3 (moderate): Secondary | ICD-10-CM | POA: Diagnosis not present

## 2017-10-15 DIAGNOSIS — I5032 Chronic diastolic (congestive) heart failure: Secondary | ICD-10-CM | POA: Diagnosis not present

## 2017-10-15 DIAGNOSIS — J449 Chronic obstructive pulmonary disease, unspecified: Secondary | ICD-10-CM | POA: Diagnosis not present

## 2017-10-15 DIAGNOSIS — F329 Major depressive disorder, single episode, unspecified: Secondary | ICD-10-CM | POA: Diagnosis not present

## 2017-10-16 ENCOUNTER — Ambulatory Visit: Payer: Self-pay | Admitting: Endocrinology

## 2017-10-16 DIAGNOSIS — I5032 Chronic diastolic (congestive) heart failure: Secondary | ICD-10-CM | POA: Diagnosis not present

## 2017-10-16 DIAGNOSIS — N183 Chronic kidney disease, stage 3 (moderate): Secondary | ICD-10-CM | POA: Diagnosis not present

## 2017-10-16 DIAGNOSIS — F419 Anxiety disorder, unspecified: Secondary | ICD-10-CM | POA: Diagnosis not present

## 2017-10-16 DIAGNOSIS — M545 Low back pain: Secondary | ICD-10-CM | POA: Diagnosis not present

## 2017-10-16 DIAGNOSIS — Z0289 Encounter for other administrative examinations: Secondary | ICD-10-CM

## 2017-10-16 DIAGNOSIS — G894 Chronic pain syndrome: Secondary | ICD-10-CM | POA: Diagnosis not present

## 2017-10-16 DIAGNOSIS — F329 Major depressive disorder, single episode, unspecified: Secondary | ICD-10-CM | POA: Diagnosis not present

## 2017-10-16 DIAGNOSIS — I130 Hypertensive heart and chronic kidney disease with heart failure and stage 1 through stage 4 chronic kidney disease, or unspecified chronic kidney disease: Secondary | ICD-10-CM | POA: Diagnosis not present

## 2017-10-16 DIAGNOSIS — J449 Chronic obstructive pulmonary disease, unspecified: Secondary | ICD-10-CM | POA: Diagnosis not present

## 2017-10-16 DIAGNOSIS — E1122 Type 2 diabetes mellitus with diabetic chronic kidney disease: Secondary | ICD-10-CM | POA: Diagnosis not present

## 2017-10-17 DIAGNOSIS — J449 Chronic obstructive pulmonary disease, unspecified: Secondary | ICD-10-CM | POA: Diagnosis not present

## 2017-10-22 DIAGNOSIS — E1122 Type 2 diabetes mellitus with diabetic chronic kidney disease: Secondary | ICD-10-CM | POA: Diagnosis not present

## 2017-10-22 DIAGNOSIS — F419 Anxiety disorder, unspecified: Secondary | ICD-10-CM | POA: Diagnosis not present

## 2017-10-22 DIAGNOSIS — I5032 Chronic diastolic (congestive) heart failure: Secondary | ICD-10-CM | POA: Diagnosis not present

## 2017-10-22 DIAGNOSIS — N183 Chronic kidney disease, stage 3 (moderate): Secondary | ICD-10-CM | POA: Diagnosis not present

## 2017-10-22 DIAGNOSIS — G894 Chronic pain syndrome: Secondary | ICD-10-CM | POA: Diagnosis not present

## 2017-10-22 DIAGNOSIS — J449 Chronic obstructive pulmonary disease, unspecified: Secondary | ICD-10-CM | POA: Diagnosis not present

## 2017-10-22 DIAGNOSIS — M545 Low back pain: Secondary | ICD-10-CM | POA: Diagnosis not present

## 2017-10-22 DIAGNOSIS — F329 Major depressive disorder, single episode, unspecified: Secondary | ICD-10-CM | POA: Diagnosis not present

## 2017-10-22 DIAGNOSIS — I130 Hypertensive heart and chronic kidney disease with heart failure and stage 1 through stage 4 chronic kidney disease, or unspecified chronic kidney disease: Secondary | ICD-10-CM | POA: Diagnosis not present

## 2017-10-28 ENCOUNTER — Other Ambulatory Visit: Payer: Self-pay | Admitting: Adult Health

## 2017-10-28 DIAGNOSIS — E118 Type 2 diabetes mellitus with unspecified complications: Secondary | ICD-10-CM

## 2017-10-28 DIAGNOSIS — E1165 Type 2 diabetes mellitus with hyperglycemia: Secondary | ICD-10-CM

## 2017-10-28 DIAGNOSIS — J441 Chronic obstructive pulmonary disease with (acute) exacerbation: Secondary | ICD-10-CM

## 2017-10-28 DIAGNOSIS — IMO0002 Reserved for concepts with insufficient information to code with codable children: Secondary | ICD-10-CM

## 2017-10-29 ENCOUNTER — Other Ambulatory Visit: Payer: Self-pay | Admitting: Adult Health

## 2017-10-29 DIAGNOSIS — B20 Human immunodeficiency virus [HIV] disease: Secondary | ICD-10-CM

## 2017-10-31 DIAGNOSIS — F329 Major depressive disorder, single episode, unspecified: Secondary | ICD-10-CM | POA: Diagnosis not present

## 2017-10-31 DIAGNOSIS — Z794 Long term (current) use of insulin: Secondary | ICD-10-CM | POA: Diagnosis not present

## 2017-10-31 DIAGNOSIS — I5032 Chronic diastolic (congestive) heart failure: Secondary | ICD-10-CM | POA: Diagnosis not present

## 2017-10-31 DIAGNOSIS — Z7984 Long term (current) use of oral hypoglycemic drugs: Secondary | ICD-10-CM | POA: Diagnosis not present

## 2017-10-31 DIAGNOSIS — G894 Chronic pain syndrome: Secondary | ICD-10-CM | POA: Diagnosis not present

## 2017-10-31 DIAGNOSIS — N183 Chronic kidney disease, stage 3 (moderate): Secondary | ICD-10-CM | POA: Diagnosis not present

## 2017-10-31 DIAGNOSIS — H52223 Regular astigmatism, bilateral: Secondary | ICD-10-CM | POA: Diagnosis not present

## 2017-10-31 DIAGNOSIS — H11159 Pinguecula, unspecified eye: Secondary | ICD-10-CM | POA: Diagnosis not present

## 2017-10-31 DIAGNOSIS — H524 Presbyopia: Secondary | ICD-10-CM | POA: Diagnosis not present

## 2017-10-31 DIAGNOSIS — J449 Chronic obstructive pulmonary disease, unspecified: Secondary | ICD-10-CM | POA: Diagnosis not present

## 2017-10-31 DIAGNOSIS — M545 Low back pain: Secondary | ICD-10-CM | POA: Diagnosis not present

## 2017-10-31 DIAGNOSIS — E119 Type 2 diabetes mellitus without complications: Secondary | ICD-10-CM | POA: Diagnosis not present

## 2017-10-31 DIAGNOSIS — F419 Anxiety disorder, unspecified: Secondary | ICD-10-CM | POA: Diagnosis not present

## 2017-10-31 DIAGNOSIS — I10 Essential (primary) hypertension: Secondary | ICD-10-CM | POA: Diagnosis not present

## 2017-10-31 DIAGNOSIS — E1122 Type 2 diabetes mellitus with diabetic chronic kidney disease: Secondary | ICD-10-CM | POA: Diagnosis not present

## 2017-10-31 DIAGNOSIS — E109 Type 1 diabetes mellitus without complications: Secondary | ICD-10-CM | POA: Diagnosis not present

## 2017-10-31 DIAGNOSIS — I130 Hypertensive heart and chronic kidney disease with heart failure and stage 1 through stage 4 chronic kidney disease, or unspecified chronic kidney disease: Secondary | ICD-10-CM | POA: Diagnosis not present

## 2017-11-01 DIAGNOSIS — M545 Low back pain: Secondary | ICD-10-CM | POA: Diagnosis not present

## 2017-11-01 DIAGNOSIS — E1122 Type 2 diabetes mellitus with diabetic chronic kidney disease: Secondary | ICD-10-CM | POA: Diagnosis not present

## 2017-11-01 DIAGNOSIS — F329 Major depressive disorder, single episode, unspecified: Secondary | ICD-10-CM | POA: Diagnosis not present

## 2017-11-01 DIAGNOSIS — N183 Chronic kidney disease, stage 3 (moderate): Secondary | ICD-10-CM | POA: Diagnosis not present

## 2017-11-01 DIAGNOSIS — I130 Hypertensive heart and chronic kidney disease with heart failure and stage 1 through stage 4 chronic kidney disease, or unspecified chronic kidney disease: Secondary | ICD-10-CM | POA: Diagnosis not present

## 2017-11-01 DIAGNOSIS — G894 Chronic pain syndrome: Secondary | ICD-10-CM | POA: Diagnosis not present

## 2017-11-01 DIAGNOSIS — J449 Chronic obstructive pulmonary disease, unspecified: Secondary | ICD-10-CM | POA: Diagnosis not present

## 2017-11-01 DIAGNOSIS — I5032 Chronic diastolic (congestive) heart failure: Secondary | ICD-10-CM | POA: Diagnosis not present

## 2017-11-01 DIAGNOSIS — F419 Anxiety disorder, unspecified: Secondary | ICD-10-CM | POA: Diagnosis not present

## 2017-11-05 DIAGNOSIS — I130 Hypertensive heart and chronic kidney disease with heart failure and stage 1 through stage 4 chronic kidney disease, or unspecified chronic kidney disease: Secondary | ICD-10-CM | POA: Diagnosis not present

## 2017-11-05 DIAGNOSIS — M545 Low back pain: Secondary | ICD-10-CM | POA: Diagnosis not present

## 2017-11-05 DIAGNOSIS — F419 Anxiety disorder, unspecified: Secondary | ICD-10-CM | POA: Diagnosis not present

## 2017-11-05 DIAGNOSIS — E1122 Type 2 diabetes mellitus with diabetic chronic kidney disease: Secondary | ICD-10-CM | POA: Diagnosis not present

## 2017-11-05 DIAGNOSIS — I5032 Chronic diastolic (congestive) heart failure: Secondary | ICD-10-CM | POA: Diagnosis not present

## 2017-11-05 DIAGNOSIS — F329 Major depressive disorder, single episode, unspecified: Secondary | ICD-10-CM | POA: Diagnosis not present

## 2017-11-05 DIAGNOSIS — N183 Chronic kidney disease, stage 3 (moderate): Secondary | ICD-10-CM | POA: Diagnosis not present

## 2017-11-05 DIAGNOSIS — G894 Chronic pain syndrome: Secondary | ICD-10-CM | POA: Diagnosis not present

## 2017-11-05 DIAGNOSIS — J449 Chronic obstructive pulmonary disease, unspecified: Secondary | ICD-10-CM | POA: Diagnosis not present

## 2017-11-07 DIAGNOSIS — F1120 Opioid dependence, uncomplicated: Secondary | ICD-10-CM | POA: Diagnosis not present

## 2017-11-07 DIAGNOSIS — G546 Phantom limb syndrome with pain: Secondary | ICD-10-CM | POA: Diagnosis not present

## 2017-11-07 DIAGNOSIS — J449 Chronic obstructive pulmonary disease, unspecified: Secondary | ICD-10-CM | POA: Diagnosis not present

## 2017-11-07 DIAGNOSIS — M5136 Other intervertebral disc degeneration, lumbar region: Secondary | ICD-10-CM | POA: Diagnosis not present

## 2017-11-07 DIAGNOSIS — G894 Chronic pain syndrome: Secondary | ICD-10-CM | POA: Diagnosis not present

## 2017-11-11 DIAGNOSIS — J449 Chronic obstructive pulmonary disease, unspecified: Secondary | ICD-10-CM | POA: Diagnosis not present

## 2017-11-11 DIAGNOSIS — E1122 Type 2 diabetes mellitus with diabetic chronic kidney disease: Secondary | ICD-10-CM | POA: Diagnosis not present

## 2017-11-11 DIAGNOSIS — I130 Hypertensive heart and chronic kidney disease with heart failure and stage 1 through stage 4 chronic kidney disease, or unspecified chronic kidney disease: Secondary | ICD-10-CM | POA: Diagnosis not present

## 2017-11-11 DIAGNOSIS — I5032 Chronic diastolic (congestive) heart failure: Secondary | ICD-10-CM | POA: Diagnosis not present

## 2017-11-11 DIAGNOSIS — F329 Major depressive disorder, single episode, unspecified: Secondary | ICD-10-CM | POA: Diagnosis not present

## 2017-11-11 DIAGNOSIS — N183 Chronic kidney disease, stage 3 (moderate): Secondary | ICD-10-CM | POA: Diagnosis not present

## 2017-11-11 DIAGNOSIS — G894 Chronic pain syndrome: Secondary | ICD-10-CM | POA: Diagnosis not present

## 2017-11-11 DIAGNOSIS — F419 Anxiety disorder, unspecified: Secondary | ICD-10-CM | POA: Diagnosis not present

## 2017-11-11 DIAGNOSIS — M545 Low back pain: Secondary | ICD-10-CM | POA: Diagnosis not present

## 2017-11-12 DIAGNOSIS — J449 Chronic obstructive pulmonary disease, unspecified: Secondary | ICD-10-CM | POA: Diagnosis not present

## 2017-11-14 DIAGNOSIS — Z79899 Other long term (current) drug therapy: Secondary | ICD-10-CM | POA: Diagnosis not present

## 2017-11-14 DIAGNOSIS — F411 Generalized anxiety disorder: Secondary | ICD-10-CM | POA: Diagnosis not present

## 2017-11-14 DIAGNOSIS — Z5181 Encounter for therapeutic drug level monitoring: Secondary | ICD-10-CM | POA: Diagnosis not present

## 2017-11-15 DIAGNOSIS — J449 Chronic obstructive pulmonary disease, unspecified: Secondary | ICD-10-CM | POA: Diagnosis not present

## 2017-11-18 DIAGNOSIS — J449 Chronic obstructive pulmonary disease, unspecified: Secondary | ICD-10-CM | POA: Diagnosis not present

## 2017-11-19 DIAGNOSIS — J449 Chronic obstructive pulmonary disease, unspecified: Secondary | ICD-10-CM | POA: Diagnosis not present

## 2017-11-21 DIAGNOSIS — J449 Chronic obstructive pulmonary disease, unspecified: Secondary | ICD-10-CM | POA: Diagnosis not present

## 2017-11-22 DIAGNOSIS — J449 Chronic obstructive pulmonary disease, unspecified: Secondary | ICD-10-CM | POA: Diagnosis not present

## 2017-12-03 DIAGNOSIS — J449 Chronic obstructive pulmonary disease, unspecified: Secondary | ICD-10-CM | POA: Diagnosis not present

## 2017-12-04 DIAGNOSIS — J449 Chronic obstructive pulmonary disease, unspecified: Secondary | ICD-10-CM | POA: Diagnosis not present

## 2017-12-05 DIAGNOSIS — J449 Chronic obstructive pulmonary disease, unspecified: Secondary | ICD-10-CM | POA: Diagnosis not present

## 2017-12-06 DIAGNOSIS — J449 Chronic obstructive pulmonary disease, unspecified: Secondary | ICD-10-CM | POA: Diagnosis not present

## 2017-12-09 DIAGNOSIS — J449 Chronic obstructive pulmonary disease, unspecified: Secondary | ICD-10-CM | POA: Diagnosis not present

## 2017-12-09 DIAGNOSIS — G894 Chronic pain syndrome: Secondary | ICD-10-CM | POA: Diagnosis not present

## 2017-12-09 DIAGNOSIS — G546 Phantom limb syndrome with pain: Secondary | ICD-10-CM | POA: Diagnosis not present

## 2017-12-09 DIAGNOSIS — M5136 Other intervertebral disc degeneration, lumbar region: Secondary | ICD-10-CM | POA: Diagnosis not present

## 2017-12-09 DIAGNOSIS — F1120 Opioid dependence, uncomplicated: Secondary | ICD-10-CM | POA: Diagnosis not present

## 2017-12-09 DIAGNOSIS — Z79899 Other long term (current) drug therapy: Secondary | ICD-10-CM | POA: Diagnosis not present

## 2017-12-12 DIAGNOSIS — J449 Chronic obstructive pulmonary disease, unspecified: Secondary | ICD-10-CM | POA: Diagnosis not present

## 2017-12-13 DIAGNOSIS — J449 Chronic obstructive pulmonary disease, unspecified: Secondary | ICD-10-CM | POA: Diagnosis not present

## 2017-12-16 DIAGNOSIS — J449 Chronic obstructive pulmonary disease, unspecified: Secondary | ICD-10-CM | POA: Diagnosis not present

## 2017-12-17 DIAGNOSIS — J449 Chronic obstructive pulmonary disease, unspecified: Secondary | ICD-10-CM | POA: Diagnosis not present

## 2017-12-18 DIAGNOSIS — F411 Generalized anxiety disorder: Secondary | ICD-10-CM | POA: Diagnosis not present

## 2017-12-19 DIAGNOSIS — J449 Chronic obstructive pulmonary disease, unspecified: Secondary | ICD-10-CM | POA: Diagnosis not present

## 2017-12-20 DIAGNOSIS — J449 Chronic obstructive pulmonary disease, unspecified: Secondary | ICD-10-CM | POA: Diagnosis not present

## 2017-12-23 ENCOUNTER — Other Ambulatory Visit: Payer: Self-pay

## 2017-12-23 ENCOUNTER — Telehealth: Payer: Self-pay | Admitting: Endocrinology

## 2017-12-23 DIAGNOSIS — J449 Chronic obstructive pulmonary disease, unspecified: Secondary | ICD-10-CM | POA: Diagnosis not present

## 2017-12-23 NOTE — Telephone Encounter (Signed)
MEDICATION: Accucheck Aviva Test Strips for Glucose Meter  PHARMACY:  [facility] [place] : Yes  IS PATIENT OUT OF MEDICATION: Yes  IF NOT; HOW MUCH IS LEFT: None  LAST APPOINTMENT DATE: @10 /16/2019  NEXT APPOINTMENT DATE:@Visit  date not found  DO WE HAVE YOUR PERMISSION TO LEAVE A DETAILED MESSAGE:Yes  OTHER COMMENTS:  Patient states Pharmacy requested the RX but  Pharmacy told patient the RX was denied by Dr. Dwyane Dee  **Let patient know to contact pharmacy at the end of the day to make sure medication is ready. **  ** Please notify patient to allow 48-72 hours to process**  **Encourage patient to contact the pharmacy for refills or they can request refills through [facility]**

## 2017-12-24 ENCOUNTER — Other Ambulatory Visit: Payer: Self-pay

## 2017-12-24 DIAGNOSIS — J449 Chronic obstructive pulmonary disease, unspecified: Secondary | ICD-10-CM | POA: Diagnosis not present

## 2017-12-24 MED ORDER — GLUCOSE BLOOD VI STRP: ORAL_STRIP | 3 refills | Status: DC

## 2017-12-24 NOTE — Telephone Encounter (Signed)
Rx sent 

## 2017-12-26 ENCOUNTER — Other Ambulatory Visit: Payer: Self-pay | Admitting: Pulmonary Disease

## 2017-12-26 ENCOUNTER — Telehealth: Payer: Self-pay | Admitting: Pulmonary Disease

## 2017-12-26 DIAGNOSIS — J441 Chronic obstructive pulmonary disease with (acute) exacerbation: Secondary | ICD-10-CM

## 2017-12-26 DIAGNOSIS — J449 Chronic obstructive pulmonary disease, unspecified: Secondary | ICD-10-CM | POA: Diagnosis not present

## 2017-12-26 MED ORDER — FLUTICASONE FUROATE-VILANTEROL 100-25 MCG/INH IN AEPB: INHALATION_SPRAY | RESPIRATORY_TRACT | 0 refills | Status: DC

## 2017-12-26 NOTE — Telephone Encounter (Signed)
Call made to patient regarding Breo refill, informed patient I would give her a 30 day refill however she needs to make a office visit for any further refills. Appointment offered, patient declined, she states January is already full for her and she will call back to schedule her f/u appointment. Nothing further is needed at this time.

## 2017-12-27 DIAGNOSIS — J449 Chronic obstructive pulmonary disease, unspecified: Secondary | ICD-10-CM | POA: Diagnosis not present

## 2017-12-28 ENCOUNTER — Emergency Department (HOSPITAL_COMMUNITY): Payer: Medicare HMO

## 2017-12-28 ENCOUNTER — Inpatient Hospital Stay (HOSPITAL_COMMUNITY)
Admission: EM | Admit: 2017-12-28 | Discharge: 2018-01-01 | DRG: 974 | Disposition: A | Discharge status: Home Health | Payer: Medicare HMO | Attending: Internal Medicine | Admitting: Internal Medicine

## 2017-12-28 ENCOUNTER — Encounter (HOSPITAL_COMMUNITY): Payer: Self-pay

## 2017-12-28 ENCOUNTER — Other Ambulatory Visit: Payer: Self-pay

## 2017-12-28 DIAGNOSIS — J181 Lobar pneumonia, unspecified organism: Principal | ICD-10-CM | POA: Diagnosis present

## 2017-12-28 DIAGNOSIS — Z794 Long term (current) use of insulin: Secondary | ICD-10-CM | POA: Diagnosis not present

## 2017-12-28 DIAGNOSIS — J441 Chronic obstructive pulmonary disease with (acute) exacerbation: Secondary | ICD-10-CM | POA: Diagnosis not present

## 2017-12-28 DIAGNOSIS — J209 Acute bronchitis, unspecified: Secondary | ICD-10-CM | POA: Diagnosis present

## 2017-12-28 DIAGNOSIS — E1165 Type 2 diabetes mellitus with hyperglycemia: Secondary | ICD-10-CM | POA: Diagnosis not present

## 2017-12-28 DIAGNOSIS — Z9981 Dependence on supplemental oxygen: Secondary | ICD-10-CM

## 2017-12-28 DIAGNOSIS — M810 Age-related osteoporosis without current pathological fracture: Secondary | ICD-10-CM | POA: Diagnosis present

## 2017-12-28 DIAGNOSIS — J189 Pneumonia, unspecified organism: Secondary | ICD-10-CM

## 2017-12-28 DIAGNOSIS — B20 Human immunodeficiency virus [HIV] disease: Secondary | ICD-10-CM | POA: Diagnosis not present

## 2017-12-28 DIAGNOSIS — Z79899 Other long term (current) drug therapy: Secondary | ICD-10-CM | POA: Diagnosis not present

## 2017-12-28 DIAGNOSIS — E1140 Type 2 diabetes mellitus with diabetic neuropathy, unspecified: Secondary | ICD-10-CM | POA: Diagnosis present

## 2017-12-28 DIAGNOSIS — N184 Chronic kidney disease, stage 4 (severe): Secondary | ICD-10-CM | POA: Diagnosis not present

## 2017-12-28 DIAGNOSIS — F419 Anxiety disorder, unspecified: Secondary | ICD-10-CM

## 2017-12-28 DIAGNOSIS — F329 Major depressive disorder, single episode, unspecified: Secondary | ICD-10-CM

## 2017-12-28 DIAGNOSIS — E662 Morbid (severe) obesity with alveolar hypoventilation: Secondary | ICD-10-CM | POA: Diagnosis not present

## 2017-12-28 DIAGNOSIS — E1169 Type 2 diabetes mellitus with other specified complication: Secondary | ICD-10-CM | POA: Diagnosis present

## 2017-12-28 DIAGNOSIS — Z720 Tobacco use: Secondary | ICD-10-CM | POA: Diagnosis present

## 2017-12-28 DIAGNOSIS — E1122 Type 2 diabetes mellitus with diabetic chronic kidney disease: Secondary | ICD-10-CM | POA: Diagnosis present

## 2017-12-28 DIAGNOSIS — G8929 Other chronic pain: Secondary | ICD-10-CM | POA: Diagnosis present

## 2017-12-28 DIAGNOSIS — R000 Tachycardia, unspecified: Secondary | ICD-10-CM | POA: Diagnosis not present

## 2017-12-28 DIAGNOSIS — J440 Chronic obstructive pulmonary disease with acute lower respiratory infection: Secondary | ICD-10-CM | POA: Diagnosis not present

## 2017-12-28 DIAGNOSIS — F418 Other specified anxiety disorders: Secondary | ICD-10-CM | POA: Diagnosis present

## 2017-12-28 DIAGNOSIS — Z6833 Body mass index (BMI) 33.0-33.9, adult: Secondary | ICD-10-CM | POA: Diagnosis not present

## 2017-12-28 DIAGNOSIS — Z89512 Acquired absence of left leg below knee: Secondary | ICD-10-CM | POA: Diagnosis not present

## 2017-12-28 DIAGNOSIS — Z7951 Long term (current) use of inhaled steroids: Secondary | ICD-10-CM | POA: Diagnosis not present

## 2017-12-28 DIAGNOSIS — Z5320 Procedure and treatment not carried out because of patient's decision for unspecified reasons: Secondary | ICD-10-CM | POA: Diagnosis present

## 2017-12-28 DIAGNOSIS — I252 Old myocardial infarction: Secondary | ICD-10-CM | POA: Diagnosis not present

## 2017-12-28 DIAGNOSIS — J80 Acute respiratory distress syndrome: Secondary | ICD-10-CM | POA: Diagnosis not present

## 2017-12-28 DIAGNOSIS — F17210 Nicotine dependence, cigarettes, uncomplicated: Secondary | ICD-10-CM | POA: Diagnosis present

## 2017-12-28 DIAGNOSIS — Z66 Do not resuscitate: Secondary | ICD-10-CM | POA: Diagnosis present

## 2017-12-28 DIAGNOSIS — Z8249 Family history of ischemic heart disease and other diseases of the circulatory system: Secondary | ICD-10-CM | POA: Diagnosis not present

## 2017-12-28 DIAGNOSIS — E118 Type 2 diabetes mellitus with unspecified complications: Secondary | ICD-10-CM

## 2017-12-28 DIAGNOSIS — I129 Hypertensive chronic kidney disease with stage 1 through stage 4 chronic kidney disease, or unspecified chronic kidney disease: Secondary | ICD-10-CM | POA: Diagnosis present

## 2017-12-28 DIAGNOSIS — F32A Depression, unspecified: Secondary | ICD-10-CM

## 2017-12-28 DIAGNOSIS — Z833 Family history of diabetes mellitus: Secondary | ICD-10-CM | POA: Diagnosis not present

## 2017-12-28 DIAGNOSIS — E785 Hyperlipidemia, unspecified: Secondary | ICD-10-CM | POA: Diagnosis not present

## 2017-12-28 DIAGNOSIS — J9621 Acute and chronic respiratory failure with hypoxia: Secondary | ICD-10-CM | POA: Diagnosis not present

## 2017-12-28 DIAGNOSIS — J449 Chronic obstructive pulmonary disease, unspecified: Secondary | ICD-10-CM | POA: Diagnosis present

## 2017-12-28 DIAGNOSIS — R0902 Hypoxemia: Secondary | ICD-10-CM | POA: Diagnosis not present

## 2017-12-28 DIAGNOSIS — R0602 Shortness of breath: Secondary | ICD-10-CM | POA: Diagnosis not present

## 2017-12-28 DIAGNOSIS — IMO0002 Reserved for concepts with insufficient information to code with codable children: Secondary | ICD-10-CM | POA: Diagnosis present

## 2017-12-28 DIAGNOSIS — J9601 Acute respiratory failure with hypoxia: Secondary | ICD-10-CM

## 2017-12-28 LAB — CBC
HEMATOCRIT: 45.4 % (ref 36.0–46.0)
Hemoglobin: 13.6 g/dL (ref 12.0–15.0)
MCH: 28.7 pg (ref 26.0–34.0)
MCHC: 30 g/dL (ref 30.0–36.0)
MCV: 95.8 fL (ref 80.0–100.0)
Platelets: 308 10*3/uL (ref 150–400)
RBC: 4.74 MIL/uL (ref 3.87–5.11)
RDW: 20.6 % — ABNORMAL HIGH (ref 11.5–15.5)
WBC: 26.9 10*3/uL — ABNORMAL HIGH (ref 4.0–10.5)
nRBC: 0 % (ref 0.0–0.2)

## 2017-12-28 LAB — COMPREHENSIVE METABOLIC PANEL
ALBUMIN: 3.3 g/dL — AB (ref 3.5–5.0)
ALT: 19 U/L (ref 0–44)
ANION GAP: 13 (ref 5–15)
AST: 34 U/L (ref 15–41)
Alkaline Phosphatase: 60 U/L (ref 38–126)
BUN: 34 mg/dL — ABNORMAL HIGH (ref 6–20)
CALCIUM: 9 mg/dL (ref 8.9–10.3)
CHLORIDE: 102 mmol/L (ref 98–111)
CO2: 26 mmol/L (ref 22–32)
Creatinine, Ser: 1.73 mg/dL — ABNORMAL HIGH (ref 0.44–1.00)
GFR calc non Af Amer: 33 mL/min — ABNORMAL LOW (ref >60)
GFR, EST AFRICAN AMERICAN: 38 mL/min — AB (ref >60)
GLUCOSE: 241 mg/dL — AB (ref 70–99)
POTASSIUM: 4.1 mmol/L (ref 3.5–5.1)
SODIUM: 141 mmol/L (ref 135–145)
Total Bilirubin: 0.6 mg/dL (ref 0.3–1.2)
Total Protein: 6.4 g/dL — ABNORMAL LOW (ref 6.5–8.1)

## 2017-12-28 LAB — BLOOD GAS, VENOUS
Acid-Base Excess: 2.7 mmol/L — ABNORMAL HIGH (ref 0.0–2.0)
Bicarbonate: 28.6 mmol/L — ABNORMAL HIGH (ref 20.0–28.0)
O2 Content: 6 L/min
O2 Saturation: 54.3 %
PH VEN: 7.36 (ref 7.250–7.430)
Patient temperature: 98.6
pCO2, Ven: 52 mmHg (ref 44.0–60.0)

## 2017-12-28 LAB — I-STAT TROPONIN, ED: Troponin i, poc: 0.03 ng/mL (ref 0.00–0.08)

## 2017-12-28 MED ORDER — ALBUTEROL (5 MG/ML) CONTINUOUS INHALATION SOLN
15.0000 mg/h | INHALATION_SOLUTION | RESPIRATORY_TRACT | Status: AC
  Administered 2017-12-28: 15 mg/h via RESPIRATORY_TRACT
  Filled 2017-12-28: qty 20

## 2017-12-28 MED ORDER — LEVOFLOXACIN IN D5W 500 MG/100ML IV SOLN
500.0000 mg | Freq: Once | INTRAVENOUS | Status: AC
  Administered 2017-12-29: 500 mg via INTRAVENOUS
  Filled 2017-12-28: qty 100

## 2017-12-28 MED ORDER — SODIUM CHLORIDE 0.9 % IV SOLN
1.0000 g | Freq: Once | INTRAVENOUS | Status: AC
  Administered 2017-12-28: 1 g via INTRAVENOUS
  Filled 2017-12-28: qty 10

## 2017-12-28 NOTE — ED Notes (Signed)
Respiratory made aware of VBG and continuous nebulization

## 2017-12-28 NOTE — ED Triage Notes (Signed)
Pt BIB [facility] from home. She is complaining of increased SOB over the last 2 weeks and worsening generalized weakness throughout today. She received 10mg  of albuterol, 0.5 mg of atrovent, and 125 mg of solumedrol en route. Pertinent hx COPD, asthma, diabetes, HIV, and NSTEMI.

## 2017-12-28 NOTE — ED Provider Notes (Signed)
[facility]-EMERGENCY DEPT Provider Note   CSN: [id number] Arrival date & time: 12/28/17  2019     History   Chief Complaint Chief Complaint  Patient presents with  . Shortness of Breath    HPI [name] is a 52 y.o. female.  52yo F w/ PMH including COPD on 3L home O2, HIV on ARV, IDDM, CKD, NSTEMI who p/w respiratory distress.  Patient was brought in by EMS complaining of increased shortness of breath over the past 2 weeks associated with cough productive of white phlegm.  She has had generalized weakness today that has worsened throughout the day.  She was in respiratory distress on EMS arrival and they gave her Atrovent, albuterol, and Solu-Medrol prior to arrival.  She endorses chest tightness that she has had previously with COPD exacerbations.  She has been compliant with her medications.  Denies any vomiting.  LEVEL 5 CAVEAT DUE TO RESPIRATORY  DISTRESS  The history is provided by the patient. The history is limited by the condition of the patient.  Shortness of Breath     Past Medical History:  Diagnosis Date  . Acute metabolic encephalopathy 05/19/2016  . Acute on chronic respiratory failure with hypoxia ([name]) 05/19/2016  . Anxiety   . ARF (acute renal failure) ([name]) 05/03/2014  . Arthritis    LEFT HIP  . Asthma    HOSPITALIZED WITH EXCERBATION OF ASTHMA - AND BRONCHITIS AND THE FLU DEC 2013  . Asthma exacerbation attacks 12/28/2011  . Bronchitis 12/28/2011  . CAP (community acquired pneumonia) 07/25/2012  . Chronic kidney disease (CKD), stage IV (severe) ([name]) 11/10/2014  . Class 3 obesity due to excess calories with serious comorbidity and body mass index (BMI) of 40.0 to 44.9 in adult   . Colles' fracture of left radius   . COPD (chronic obstructive pulmonary disease) ([name])   . Depression   . Diabetes mellitus    ON INSULIN AND ORAL MEDICATIONS  . Diabetes mellitus type 2 with complications, uncontrolled ([name]) 04/19/2008   Qualifier:  Diagnosis of  By: Vollmer MD, Kelly     . Diabetic foot infection ([name]) 11/09/2014  . Diabetic ketoacidosis without coma associated with type 2 diabetes mellitus ([name])   . Diabetic neuropathy ([name]) 11/10/2014  . Diarrhea 08/30/2008   Qualifier: Diagnosis of  By: Vollmer MD, Kelly    . Diastolic dysfunction 11/10/2014  . Difficult intravenous access   . DKA, type 2 ([name]) 04/04/2016  . Dyslipidemia 12/23/2008   Qualifier: Diagnosis of  By: Vollmer MD, Kelly    . Essential hypertension 04/19/2008   Qualifier: Diagnosis of  By: Vollmer MD, Kelly    . FATIGUE 07/12/2008   Qualifier: Diagnosis of  By: Vollmer MD, Kelly    . Femoral neck fracture ([name]) 07/16/2011  . Femur fracture, left ([name]) 07/08/2014  . Fever   . Fracture of distal femur ([name]) 07/08/2014  . GERD (gastroesophageal reflux disease)   . Hereditary and idiopathic peripheral neuropathy 04/19/2008   Qualifier: Diagnosis of  By: Vollmer MD, Kelly    . Hip fracture, left ([name]) 07/16/2011  . Hip pain   . HIV infection ([name]) 2000  . Human immunodeficiency virus (HIV) disease ([name]) 04/19/2008   [id number] +   . Hyperlipidemia   . Hypertension   . Hyponatremia 12/29/2011  . Influenza A 12/29/2011  . Influenza B   . Insomnia 06/12/2016  . Left hip pain 05/03/2014  . Mood disorder ([name]) 04/19/2008   Qualifier: Diagnosis of    By: Tomma Lightning MD, Claiborne Billings    . Neuropathy    NEUROPATHY HANDS AND FEET  . NSTEMI (non-ST elevated myocardial infarction) ([place]) 05/19/2016  . Obesity hypoventilation syndrome ([place])   . Osteoporosis 07/08/2014  . Pain    SEVERE PAIN LEFT HIP - HX OF LEFT HIP PINNING JULY 2013  . Perimenopausal symptoms 02/13/2012   LMP around 2011. On estrace and provera since around 2012 for hot flashes, mood swings. Estrace 2 mg daily, Provera 2.5 mg for 5 days each month.   . Repeated falls 07/30/2011  . Sepsis ([place]) 04/04/2016  . Shortness of breath    ALLERGIES ARE "ACTING UP"  . SOB (shortness of breath)   . THRUSH 05/20/2008   Qualifier:  Diagnosis of  By: Tomma Lightning MD, Claiborne Billings      . Tobacco use disorder 04/14/2010    Patient Active Problem List   Diagnosis Date Noted  . Syncope 08/29/2017  . DKA (diabetic ketoacidoses) ([place]) 03/02/2017  . COPD exacerbation ([place]) 02/23/2017  . Acute on chronic respiratory failure with hypoxia and hypercapnia ([place]) 08/19/2016  . Leukocytosis 08/19/2016  . CKD (chronic kidney disease), stage III ([place]) 08/19/2016  . Altered mental status 08/19/2016  . Asthma exacerbation 06/18/2016  . Insomnia 06/12/2016  . Difficult intravenous access   . Acute on chronic respiratory failure with hypoxia ([place]) 05/19/2016  . NSTEMI (non-ST elevated myocardial infarction) ([place]) 05/19/2016  . Acute metabolic encephalopathy 33/38/3291  . Hyperlipidemia   . Class 3 obesity due to excess calories with serious comorbidity and body mass index (BMI) of 40.0 to 44.9 in adult   . Obesity hypoventilation syndrome ([place])   . Diastolic dysfunction 91/66/0600  . Diabetic neuropathy ([place]) 11/10/2014  . Fracture of distal femur ([place]) 07/08/2014  . Osteoporosis 07/08/2014  . Femur fracture, left ([place]) 07/08/2014  . Colles' fracture of left radius   . Left hip pain 05/03/2014  . Perimenopausal symptoms 02/13/2012  . Hyponatremia 12/29/2011  . Asthma exacerbation attacks 12/28/2011  . Bronchitis 12/28/2011  . Repeated falls 07/30/2011  . Hip fracture, left ([place]) 07/16/2011  . Femoral neck fracture ([place]) 07/16/2011  . Tobacco abuse 04/14/2010  . Dyslipidemia 12/23/2008  . COPD with acute bronchitis ([place]) 11/29/2008  . THRUSH 05/20/2008  . Human immunodeficiency virus (HIV) disease ([place]) 04/19/2008  . Diabetes mellitus type 2 with complications, uncontrolled ([place]) 04/19/2008  . Mood disorder ([place]) 04/19/2008  . Hereditary and idiopathic peripheral neuropathy 04/19/2008  . Essential hypertension 04/19/2008    Past Surgical History:  Procedure Laterality Date  . AMPUTATION Left 11/18/2014   Procedure: LEFT BELOW  KNEE AMPUTATION;  Surgeon: Leandrew Koyanagi, MD;  Location: [facility];  Service: Orthopedics;  Laterality: Left;  . CAST APPLICATION Left 04/06/9975   Procedure: CAST APPLICATION (FIBERGLASS);  Surgeon: Latanya Maudlin, MD;  Location: [facility];  Service: Orthopedics;  Laterality: Left;  CLOSED REDUCTION OF LEFT COLLES FRACTURE WITH SHORT ARM CAST  . HARDWARE REMOVAL Left 05/05/2014   Procedure: HARDWARE REMOVAL LEFT HIP;  Surgeon: Latanya Maudlin, MD;  Location: [facility];  Service: Orthopedics;  Laterality: Left;  REMOVAL BIOMET 6.5-8.0 CANNULATED SCREW  . HIP ARTHROPLASTY Left 05/05/2014   Procedure: ARTHROPLASTY OPEN REDUCTION INTERNAL FIXATION LEFT HIP AND REMOVAL OF TWO CANNULATED SCREW;  Surgeon: Latanya Maudlin, MD;  Location: [facility];  Service: Orthopedics;  Laterality: Left;  . HIP PINNING,CANNULATED  07/16/2011   Procedure: CANNULATED HIP PINNING;  Surgeon: Gearlean Alf, MD;  Location: [facility];  Service: Orthopedics;  Laterality: Left;  .  HIP PINNING,CANNULATED Left 04/09/2012   Procedure: CANNULATED HIP PINNING AND HARDWARE REVISION;  Surgeon: Frank V Aluisio, MD;  Location: [facility];  Service: Orthopedics;  Laterality: Left;  . I&D EXTREMITY Left 11/16/2014   Procedure:  DEBRIDEMENT OF LEFT FOOT POSSIBLE APPLICATION OF INTEGRIA AND VAC ;  Surgeon: Brinda Thimmappa, MD;  Location: [facility];  Service: Plastics;  Laterality: Left;  . PERIPHERAL VASCULAR CATHETERIZATION N/A 11/15/2014   Procedure: Abdominal Aortogram;  Surgeon: Brian L Chen, MD;  Location: [facility];  Service: Cardiovascular;  Laterality: N/A;  . PERIPHERAL VASCULAR CATHETERIZATION  11/15/2014   Procedure: Lower Extremity Angiography;  Surgeon: Brian L Chen, MD;  Location: [facility];  Service: Cardiovascular;;  . PERIPHERAL VASCULAR CATHETERIZATION Left 11/15/2014   Procedure: Peripheral Vascular Intervention;  Surgeon: Brian L Chen, MD;  Location: [facility];  Service: Cardiovascular;  Laterality: Left;  popliteal artery  stenting     OB History    Gravida  2   Para  1   Term  1   Preterm  0   AB  1   Living  1     SAB  1   TAB      Ectopic  0   Multiple  0   Live Births               Home Medications    Prior to Admission medications   Medication Sig Start Date End Date Taking? Authorizing Provider  albuterol (PROVENTIL HFA;VENTOLIN HFA) 108 (90 Base) MCG/ACT inhaler INHALE 2 PUFFS INTO THE LUNGS EVERY 6 HOURS AS NEEDED FOR WHEEZING OR SHORTNESS OF BREATH 10/03/17  Yes Medina-Vargas, Monina C, NP  ARIPiprazole (ABILIFY) 15 MG tablet Take 1 tablet (15 mg total) by mouth daily. 10/03/17  Yes Medina-Vargas, Monina C, NP  clonazePAM (KLONOPIN) 0.5 MG tablet Take 0.5 tablets (0.25 mg total) by mouth 2 (two) times daily as needed for anxiety. 10/03/17  Yes Medina-Vargas, Monina C, NP  diphenoxylate-atropine (LOMOTIL) 2.5-0.025 MG tablet Take 1 tablet by mouth 4 (four) times daily as needed for diarrhea or loose stools. 11/20/16  Yes Hatcher, Jeffrey C, MD  doxepin (SINEQUAN) 10 MG capsule Take 1 capsule (10 mg total) by mouth at bedtime. 10/03/17  Yes Medina-Vargas, Monina C, NP  elvitegravir-cobicistat-emtricitabine-tenofovir (GENVOYA) 150-150-200-10 MG TABS tablet Take 1 tablet by mouth daily with breakfast.   Yes [provider]  fenofibrate (TRICOR) 145 MG tablet Take 1 tablet (145 mg total) by mouth daily. 10/03/17  Yes Medina-Vargas, Monina C, NP  fluticasone furoate-vilanterol (BREO ELLIPTA) 100-25 MCG/INH AEPB INHALE 1 PUFF INTO THE LUNGS DAILY 12/26/17  Yes Sood, Vineet, MD  glucose blood (ACCU-CHEK AVIVA) test strip Use as instructed to check blood sugar 2 times daily. 12/24/17  Yes Kumar, Ajay, MD  insulin aspart (NOVOLOG) 100 UNIT/ML injection Inject 2-5 Units into the skin at bedtime. SSI:  201-250 = 2 units, 251-300 = 3 units, 301-350 = 4 units, 351-400 = 5 units, >400 call provider 10/03/17  Yes Medina-Vargas, Monina C, NP  MAGNESIUM-OXIDE 400 (241.3 Mg) MG tablet Take 400  mg by mouth daily.  02/15/17  Yes [provider]  pantoprazole (PROTONIX) 40 MG tablet Take 1 tablet (40 mg total) by mouth daily. 10/03/17  Yes Medina-Vargas, Monina C, NP  pregabalin (LYRICA) 100 MG capsule Take 1 capsule (100 mg total) by mouth 2 (two) times daily. 10/07/17  Yes Hatcher, Jeffrey C, MD  rosuvastatin (CRESTOR) 20 MG tablet Take 1   tablet (20 mg total) by mouth daily. Reported on 05/02/2015 10/03/17  Yes Medina-Vargas, Monina C, NP  temazepam (RESTORIL) 7.5 MG capsule Take 1 capsule (7.5 mg total) by mouth at bedtime as needed for sleep. 10/03/17  Yes Medina-Vargas, Monina C, NP  TOUJEO MAX SOLOSTAR 300 UNIT/ML SOPN Inject 60 Units into the skin 2 (two) times daily. 10/04/17  Yes [provider]  bictegravir-emtricitabine-tenofovir AF (BIKTARVY) 50-200-25 MG TABS tablet Take 1 tablet by mouth daily. Patient not taking: Reported on 12/28/2017 10/03/17   Medina-Vargas, Monina C, NP  insulin aspart (NOVOLOG) 100 UNIT/ML injection Inject 12 Units into the skin 3 (three) times daily with meals. Patient not taking: Reported on 12/28/2017 10/03/17   Medina-Vargas, Monina C, NP  insulin aspart (NOVOLOG) cartridge Inject 0-15 Units into the skin 3 (three) times daily before meals. SSI:  150-200 = 3 units, 201-250 = 6 units, 251-300 = 9 units, 301-350 = 12 units, 351-400 = 15 units Patient not taking: Reported on 12/28/2017 10/03/17   Medina-Vargas, Monina C, NP  insulin glargine (LANTUS) 100 UNIT/ML injection Inject 0.45 mLs (45 Units total) into the skin 2 (two) times daily. Patient not taking: Reported on 12/28/2017 10/03/17   Medina-Vargas, Monina C, NP  Multiple Vitamin (MULTIVITAMIN WITH MINERALS) TABS tablet Take 1 tablet by mouth daily. Patient not taking: Reported on 12/28/2017 09/10/17   Modena Jansky, MD  nicotine (NICODERM CQ - DOSED IN MG/24 HOURS) 21 mg/24hr patch Place 1 patch (21 mg total) onto the skin daily. Patient not taking: Reported on 12/28/2017 10/03/17    Medina-Vargas, Senaida Lange, NP    Family History Family History  Problem Relation Age of Onset  . Diabetes Mother   . Hypertension Mother   . Vision loss Mother   . Heart disease Mother   . Hypertension Father   . Thyroid disease Neg Hx     Social History Social History   Tobacco Use  . Smoking status: Current Every Day Smoker    Packs/day: 0.50    Years: 35.00    Pack years: 17.50    Types: E-cigarettes, Cigarettes  . Smokeless tobacco: Never Used  Substance Use Topics  . Alcohol use: No    Alcohol/week: 0.0 standard drinks    Comment: no drink since 2008  . Drug use: No    Comment: hx of crack/cocaine, clean 8 years     Allergies   Cortizone-10 [hydrocortisone]   Review of Systems Review of Systems  Unable to perform ROS: Severe respiratory distress  Respiratory: Positive for shortness of breath.      Physical Exam Updated Vital Signs BP (!) 124/91   Pulse (!) 114   Temp 98.8 F (37.1 C) (Oral)   Resp 15   LMP 11/16/2010   SpO2 92%   Physical Exam Vitals signs and nursing note reviewed.  Constitutional:      General: She is in acute distress.     Appearance: She is well-developed.  HENT:     Head: Normocephalic and atraumatic.  Eyes:     Conjunctiva/sclera: Conjunctivae normal.  Neck:     Musculoskeletal: Neck supple.  Cardiovascular:     Rate and Rhythm: Regular rhythm. Tachycardia present.     Heart sounds: Normal heart sounds. No murmur.  Pulmonary:     Effort: Accessory muscle usage and respiratory distress present.     Breath sounds: Wheezing present.     Comments: Increased WOB with diffuse wheezes throughout all lung fields Abdominal:  General: Bowel sounds are normal. There is no distension.     Palpations: Abdomen is soft.     Tenderness: There is no abdominal tenderness.  Musculoskeletal:     Right lower leg: She exhibits no tenderness.     Left lower leg: She exhibits no tenderness.  Skin:    General: Skin is warm and dry.    Neurological:     Mental Status: She is alert and oriented to person, place, and time.     Comments: Fluent speech  Psychiatric:        Judgment: Judgment normal.      ED Treatments / Results  Labs (all labs ordered are listed, but only abnormal results are displayed) Labs Reviewed  COMPREHENSIVE METABOLIC PANEL - Abnormal; Notable for the following components:      Result Value   Glucose, Bld 241 (*)    BUN 34 (*)    Creatinine, Ser 1.73 (*)    Total Protein 6.4 (*)    Albumin 3.3 (*)    GFR calc non Af Amer 33 (*)    GFR calc Af Amer 38 (*)    All other components within normal limits  CBC - Abnormal; Notable for the following components:   WBC 26.9 (*)    RDW 20.6 (*)    All other components within normal limits  BLOOD GAS, VENOUS - Abnormal; Notable for the following components:   Bicarbonate 28.6 (*)    Acid-Base Excess 2.7 (*)    All other components within normal limits  I-STAT TROPONIN, ED    EKG EKG Interpretation  Date/Time:  Saturday December 28 2017 20:38:49 EST Ventricular Rate:  108 PR Interval:    QRS Duration: 100 QT Interval:  360 QTC Calculation: 483 R Axis:   122 Text Interpretation:  Sinus tachycardia Probable left atrial enlargement Low voltage, precordial leads RVH with secondary repolarization abnrm since previous tracing, converted from A flutter to sinus tachycardia Confirmed by Little, Rachel ([id number]) on 12/28/2017 9:30:39 PM   Radiology Dg Chest 2 View  Result Date: 12/28/2017 CLINICAL DATA:  Shortness of breath for 2 weeks. EXAM: CHEST - 2 VIEW COMPARISON:  September 01, 2017 FINDINGS: The heart size and mediastinal contours are within normal limits. Patchy consolidation of the right upper lobe is identified. The left lung is clear. There is no pleural effusion. The visualized skeletal structures are unremarkable. IMPRESSION: Right upper lobe pneumonia. Follow-up after treatment is recommended to ensure resolution. Electronically Signed    By: Wei-Chen  Lin M.D.   On: 12/28/2017 21:52    Procedures .Critical Care Performed by: Little, Rachel Morgan, MD Authorized by: Little, Rachel Morgan, MD   Critical care provider statement:    Critical care time (minutes):  30   Critical care time was exclusive of:  Separately billable procedures and treating other patients   Critical care was necessary to treat or prevent imminent or life-threatening deterioration of the following conditions:  Respiratory failure   Critical care was time spent personally by me on the following activities:  Development of treatment plan with patient or surrogate, evaluation of patient's response to treatment, examination of patient, obtaining history from patient or surrogate, ordering and performing treatments and interventions, ordering and review of laboratory studies, ordering and review of radiographic studies, re-evaluation of patient's condition and review of old charts   (including critical care time)  Medications Ordered in ED Medications  albuterol (PROVENTIL,VENTOLIN) solution continuous neb (0 mg/hr Nebulization Stopped 12/28/17 2235)  cefTRIAXone (ROCEPHIN)   1 g in sodium chloride 0.9 % 100 mL IVPB (1 g Intravenous New Bag/Given 12/28/17 2253)  levofloxacin (LEVAQUIN) IVPB 500 mg (has no administration in time range)     Initial Impression / Assessment and Plan / ED Course  I have reviewed the triage vital signs and the nursing notes.  Pertinent labs & imaging results that were available during my care of the patient were reviewed by me and considered in my medical decision making (see chart for details).     PT in respiratory distress on initial exam but mentating appropriately and following commands.  O2 saturation 92% on 5 to 6 L nasal cannula.  She did have diffuse wheezes, gave 1 hour of continuous albuterol.  Labs show glucose 241, creatinine 1.7, WBC 26.9, venous blood gas normal.  X-ray shows right upper lobe pneumonia.  I suspect  this is the cause of her increased oxygen requirement.  Gave ceftriaxone and Levaquin.  Repeat exam, she remains dyspneic but states that her breathing has improved after the continuous neb.  She has refused a BiPAP mask stating that it has not worked in the past.  Currently she is mentating normally and her vital signs are stable therefore I feel it is appropriate to hold off on BiPAP for now, however I have cautioned her that if any of her breathing worsens we will have to switch to BiPAP.  Discussed admission with [facility] hospitalist, Dr. Posey Pronto, and patient admitted for further care.  Final Clinical Impressions(s) / ED Diagnoses   Final diagnoses:  Acute respiratory failure with hypoxia ([place])  Community acquired pneumonia of right upper lobe of lung [facility])    ED Discharge Orders    None       Little, Wenda Overland, MD 12/28/17 2321

## 2017-12-28 NOTE — ED Notes (Signed)
Pt continuously requesting nerve medication, hospitalist is aware and pt knows this nurse will give her medication when her orders are placed.

## 2017-12-28 NOTE — ED Notes (Signed)
Bed: RESA Expected date:  Expected time:  Means of arrival:  Comments: 61F Respiratory Distress.

## 2017-12-28 NOTE — H&P (Signed)
History and Physical    [name] CWC:[id number] DOB: Jun 10, 1965 DOA: 12/28/2017  PCP: [name], PA-C  Patient coming from: Home  I have personally briefly reviewed patient's old medical records in [place]  Chief Complaint: Shortness of breath  HPI: [name] is a 52 y.o. female with medical history significant for COPD/Asthma on 3L O2 via [place] at night, HIV on Genvoya, CKD Stage IV, IDT2DM, HLD, anxiety, chronic pain, tobacco use, and OHS presents to the ED with shortness of breath.  Patient states that she had new onset severe shortness of breath beginning this morning with mild relief from her home nebulizers.  She reports a cough productive of white sputum.  Due to continued symptoms she called EMS who provided Atrovent, albuterol, and IV Solu-Medrol on route to the ED.  She denies any fevers, chills, diaphoresis, chest pain, abdominal pain, or dysuria.  ED Course:  Initial vitals showed BP 133/80, pulse 106, RR 24, temp 98.8 Fahrenheit, SPO2 90% on 6 L supplemental O2 via nasal cannula. Labs are notable for WBC 26.9, BUN 34, creatinine 1.73 (baseline between 1.5-1.6). VBG pH 7.36, PCO2 52, PO2 on reportable on 6 L O2 via nasal cannula. I-STAT troponin 0.03.  2 view chest x-ray showed right upper lobe pneumonia.  Patient was started on IV Levaquin and ceftriaxone and given a 1 hour continuous nebulizer.  The hospital service was consulted to admit for further management.  Review of Systems: As per HPI otherwise 10 point review of systems negative.    Past Medical History:  Diagnosis Date  . Acute metabolic encephalopathy 7/61/6073  . Acute on chronic respiratory failure with hypoxia ([place]) 05/19/2016  . Anxiety   . ARF (acute renal failure) ([place]) 05/03/2014  . Arthritis    LEFT HIP  . Asthma    HOSPITALIZED WITH EXCERBATION OF ASTHMA - AND BRONCHITIS AND THE FLU DEC 2013  . Asthma exacerbation attacks 12/28/2011  . Bronchitis 12/28/2011  . CAP  (community acquired pneumonia) 07/25/2012  . Chronic kidney disease (CKD), stage IV (severe) ([place]) 11/10/2014  . Class 3 obesity due to excess calories with serious comorbidity and body mass index (BMI) of 40.0 to 44.9 in adult   . Colles' fracture of left radius   . COPD (chronic obstructive pulmonary disease) ([place])   . Depression   . Diabetes mellitus    ON INSULIN AND ORAL MEDICATIONS  . Diabetes mellitus type 2 with complications, uncontrolled ([place]) 04/19/2008   Qualifier: Diagnosis of  By: Tomma Lightning MD, Claiborne Billings     . Diabetic foot infection ([place]) 11/09/2014  . Diabetic ketoacidosis without coma associated with type 2 diabetes mellitus ([place])   . Diabetic neuropathy ([place]) 11/10/2014  . Diarrhea 08/30/2008   Qualifier: Diagnosis of  By: Tomma Lightning MD, Claiborne Billings    . Diastolic dysfunction 71/0/6269  . Difficult intravenous access   . DKA, type 2 ([place]) 04/04/2016  . Dyslipidemia 12/23/2008   Qualifier: Diagnosis of  By: Tomma Lightning MD, Claiborne Billings    . Essential hypertension 04/19/2008   Qualifier: Diagnosis of  By: Tomma Lightning MD, Claiborne Billings    . FATIGUE 07/12/2008   Qualifier: Diagnosis of  By: Tomma Lightning MD, Claiborne Billings    . Femoral neck fracture ([place]) 07/16/2011  . Femur fracture, left ([place]) 07/08/2014  . Fever   . Fracture of distal femur ([place]) 07/08/2014  . GERD (gastroesophageal reflux disease)   . Hereditary and idiopathic peripheral neuropathy 04/19/2008   Qualifier: Diagnosis of  By: Tomma Lightning MD, Claiborne Billings    .  Hip fracture, left ([place]) 07/16/2011  . Hip pain   . HIV infection ([place]) 2000  . Human immunodeficiency virus (HIV) disease ([place]) 04/19/2008   [id number] +   . Hyperlipidemia   . Hypertension   . Hyponatremia 12/29/2011  . Influenza A 12/29/2011  . Influenza B   . Insomnia 06/12/2016  . Left hip pain 05/03/2014  . Mood disorder ([place]) 04/19/2008   Qualifier: Diagnosis of  By: Tomma Lightning MD, Claiborne Billings    . Neuropathy    NEUROPATHY HANDS AND FEET  . NSTEMI (non-ST elevated myocardial infarction) ([place]) 05/19/2016  . Obesity  hypoventilation syndrome ([place])   . Osteoporosis 07/08/2014  . Pain    SEVERE PAIN LEFT HIP - HX OF LEFT HIP PINNING JULY 2013  . Perimenopausal symptoms 02/13/2012   LMP around 2011. On estrace and provera since around 2012 for hot flashes, mood swings. Estrace 2 mg daily, Provera 2.5 mg for 5 days each month.   . Repeated falls 07/30/2011  . Sepsis ([place]) 04/04/2016  . Shortness of breath    ALLERGIES ARE "ACTING UP"  . SOB (shortness of breath)   . THRUSH 05/20/2008   Qualifier: Diagnosis of  By: Tomma Lightning MD, Claiborne Billings      . Tobacco use disorder 04/14/2010    Past Surgical History:  Procedure Laterality Date  . AMPUTATION Left 11/18/2014   Procedure: LEFT BELOW KNEE AMPUTATION;  Surgeon: Leandrew Koyanagi, MD;  Location: [facility];  Service: Orthopedics;  Laterality: Left;  . CAST APPLICATION Left 03/02/5174   Procedure: CAST APPLICATION (FIBERGLASS);  Surgeon: Latanya Maudlin, MD;  Location: [facility];  Service: Orthopedics;  Laterality: Left;  CLOSED REDUCTION OF LEFT COLLES FRACTURE WITH SHORT ARM CAST  . HARDWARE REMOVAL Left 05/05/2014   Procedure: HARDWARE REMOVAL LEFT HIP;  Surgeon: Latanya Maudlin, MD;  Location: [facility];  Service: Orthopedics;  Laterality: Left;  REMOVAL BIOMET 6.5-8.0 CANNULATED SCREW  . HIP ARTHROPLASTY Left 05/05/2014   Procedure: ARTHROPLASTY OPEN REDUCTION INTERNAL FIXATION LEFT HIP AND REMOVAL OF TWO CANNULATED SCREW;  Surgeon: Latanya Maudlin, MD;  [facility]: [facility];  Service: Orthopedics;  Laterality: Left;  . HIP PINNING,CANNULATED  07/16/2011   Procedure: CANNULATED HIP PINNING;  Surgeon: Gearlean Alf, MD;  [facility]: [facility];  Service: Orthopedics;  Laterality: Left;  . HIP PINNING,CANNULATED Left 04/09/2012   Procedure: CANNULATED HIP PINNING AND HARDWARE REVISION;  Surgeon: Gearlean Alf, MD;  [facility]: [facility];  Service: Orthopedics;  Laterality: Left;  . I&D EXTREMITY Left 11/16/2014   Procedure:  DEBRIDEMENT OF LEFT FOOT POSSIBLE APPLICATION OF INTEGRIA AND VAC ;  Surgeon:  Irene Limbo, MD;  Location: [facility];  Service: Plastics;  Laterality: Left;  . PERIPHERAL VASCULAR CATHETERIZATION N/A 11/15/2014   Procedure: Abdominal Aortogram;  Surgeon: Conrad Sextonville, MD;  Location: [facility];  Service: Cardiovascular;  Laterality: N/A;  . PERIPHERAL VASCULAR CATHETERIZATION  11/15/2014   Procedure: Lower Extremity Angiography;  Surgeon: Conrad Adelphi, MD;  Location: [facility];  Service: Cardiovascular;;  . PERIPHERAL VASCULAR CATHETERIZATION Left 11/15/2014   Procedure: Peripheral Vascular Intervention;  Surgeon: Conrad [place], MD;  Location: [facility];  Service: Cardiovascular;  Laterality: Left;  popliteal artery stenting     reports that she has been smoking e-cigarettes and cigarettes. She has a 17.50 pack-year smoking history. She has never used smokeless tobacco. She reports that she does not drink alcohol or use drugs.  Allergies  Allergen Reactions  . Cortizone-10 [Hydrocortisone] Rash  Per patient, given local injection at knee and developed rash at local site.     Family History  Problem Relation Age of Onset  . Diabetes Mother   . Hypertension Mother   . Vision loss Mother   . Heart disease Mother   . Hypertension Father   . Thyroid disease Neg Hx      Prior to Admission medications   Medication Sig Start Date End Date Taking? Authorizing Provider  albuterol (PROVENTIL HFA;VENTOLIN HFA) 108 (90 Base) MCG/ACT inhaler INHALE 2 PUFFS INTO THE LUNGS EVERY 6 HOURS AS NEEDED FOR WHEEZING OR SHORTNESS OF BREATH 10/03/17  Yes Medina-Vargas, Monina C, NP  ARIPiprazole (ABILIFY) 15 MG tablet Take 1 tablet (15 mg total) by mouth daily. 10/03/17  Yes Medina-Vargas, Monina C, NP  clonazePAM (KLONOPIN) 0.5 MG tablet Take 0.5 tablets (0.25 mg total) by mouth 2 (two) times daily as needed for anxiety. 10/03/17  Yes Medina-Vargas, Monina C, NP  diphenoxylate-atropine (LOMOTIL) 2.5-0.025 MG tablet Take 1 tablet by mouth 4 (four) times daily  as needed for diarrhea or loose stools. 11/20/16  Yes Campbell Riches, MD  doxepin (SINEQUAN) 10 MG capsule Take 1 capsule (10 mg total) by mouth at bedtime. 10/03/17  Yes Medina-Vargas, Monina C, NP  elvitegravir-cobicistat-emtricitabine-tenofovir (GENVOYA) 150-150-200-10 MG TABS tablet Take 1 tablet by mouth daily with breakfast.   Yes [provider]  fenofibrate (TRICOR) 145 MG tablet Take 1 tablet (145 mg total) by mouth daily. 10/03/17  Yes Medina-Vargas, Monina C, NP  fluticasone furoate-vilanterol (BREO ELLIPTA) 100-25 MCG/INH AEPB INHALE 1 PUFF INTO THE LUNGS DAILY 12/26/17  Yes Chesley Mires, MD  glucose blood (ACCU-CHEK AVIVA) test strip Use as instructed to check blood sugar 2 times daily. 12/24/17  Yes Elayne Snare, MD  insulin aspart (NOVOLOG) 100 UNIT/ML injection Inject 2-5 Units into the skin at bedtime. SSI:  201-250 = 2 units, 251-300 = 3 units, 301-350 = 4 units, 351-400 = 5 units, >400 call provider 10/03/17  Yes Medina-Vargas, Monina C, NP  MAGNESIUM-OXIDE 400 (241.3 Mg) MG tablet Take 400 mg by mouth daily.  02/15/17  Yes [provider]  pantoprazole (PROTONIX) 40 MG tablet Take 1 tablet (40 mg total) by mouth daily. 10/03/17  Yes Medina-Vargas, Monina C, NP  pregabalin (LYRICA) 100 MG capsule Take 1 capsule (100 mg total) by mouth 2 (two) times daily. 10/07/17  Yes Campbell Riches, MD  rosuvastatin (CRESTOR) 20 MG tablet Take 1 tablet (20 mg total) by mouth daily. Reported on 05/02/2015 10/03/17  Yes Medina-Vargas, Monina C, NP  temazepam (RESTORIL) 7.5 MG capsule Take 1 capsule (7.5 mg total) by mouth at bedtime as needed for sleep. 10/03/17  Yes Medina-Vargas, Monina C, NP  TOUJEO MAX SOLOSTAR 300 UNIT/ML SOPN Inject 60 Units into the skin 2 (two) times daily. 10/04/17  Yes [provider]  bictegravir-emtricitabine-tenofovir AF (BIKTARVY) 50-200-25 MG TABS tablet Take 1 tablet by mouth daily. Patient not taking: Reported on 12/28/2017 10/03/17    Medina-Vargas, Monina C, NP  insulin aspart (NOVOLOG) 100 UNIT/ML injection Inject 12 Units into the skin 3 (three) times daily with meals. Patient not taking: Reported on 12/28/2017 10/03/17   Medina-Vargas, Monina C, NP  insulin aspart (NOVOLOG) cartridge Inject 0-15 Units into the skin 3 (three) times daily before meals. SSI:  150-200 = 3 units, 201-250 = 6 units, 251-300 = 9 units, 301-350 = 12 units, 351-400 = 15 units Patient not taking: Reported on 12/28/2017 10/03/17   Medina-Vargas, Senaida Lange, NP  insulin glargine (LANTUS) 100 UNIT/ML injection Inject 0.45 mLs (45 Units total) into the skin 2 (two) times daily. Patient not taking: Reported on 12/28/2017 10/03/17   Medina-Vargas, Monina C, NP  Multiple Vitamin (MULTIVITAMIN WITH MINERALS) TABS tablet Take 1 tablet by mouth daily. Patient not taking: Reported on 12/28/2017 09/10/17   Modena Jansky, MD  nicotine (NICODERM CQ - DOSED IN MG/24 HOURS) 21 mg/24hr patch Place 1 patch (21 mg total) onto the skin daily. Patient not taking: Reported on 12/28/2017 10/03/17   Nickola Major, NP    Physical Exam: Vitals:   12/28/17 2130 12/28/17 2305 12/28/17 2330 12/29/17 0000  BP: (!) 124/91 (!) 166/133 94/64 99/67  Pulse: (!) 114 (!) 113 (!) 109 97  Resp: 15 18 (!) 21 15  Temp:      TempSrc:      SpO2: 92% 97% 94% (!) 85%    Constitutional: Morbidly obese woman lying flat in bed, calm Eyes: PERRL, lids and conjunctivae normal ENMT: Mucous membranes are moist. Posterior pharynx clear of any exudate or lesions.Normal dentition.  Neck: normal, supple, no masses. Respiratory: Diffuse expiratory wheezing, normal respiratory effort. No accessory muscle use.  Cardiovascular: Regular rate and rhythm, no murmurs / rubs / gallops. No extremity edema. Abdomen: no tenderness, no masses palpated. No hepatosplenomegaly. Bowel sounds positive.  Musculoskeletal: s/p L BKA Good ROM, no contractures. Normal muscle tone.  Skin: no rashes, lesions,  ulcers. No induration Neurologic: CN 2-12 grossly intact. Sensation intact, DTR normal. Strength 5/5 in all 4.  Psychiatric: Normal judgment and insight. Alert and oriented x 3. Normal mood.     Labs on Admission: I have personally reviewed following labs and imaging studies  CBC: Recent Labs  Lab 12/28/17 2109  WBC 26.9*  HGB 13.6  HCT 45.4  MCV 95.8  PLT 948   Basic Metabolic Panel: Recent Labs  Lab 12/28/17 2109  NA 141  K 4.1  CL 102  CO2 26  GLUCOSE 241*  BUN 34*  CREATININE 1.73*  CALCIUM 9.0   GFR: CrCl cannot be calculated (Unknown ideal weight.). Liver Function Tests: Recent Labs  Lab 12/28/17 2109  AST 34  ALT 19  ALKPHOS 60  BILITOT 0.6  PROT 6.4*  ALBUMIN 3.3*   No results for input(s): LIPASE, AMYLASE in the last 168 hours. No results for input(s): AMMONIA in the last 168 hours. Coagulation Profile: No results for input(s): INR, PROTIME in the last 168 hours. Cardiac Enzymes: No results for input(s): CKTOTAL, CKMB, CKMBINDEX, TROPONINI in the last 168 hours. BNP (last 3 results) No results for input(s): PROBNP in the last 8760 hours. HbA1C: No results for input(s): HGBA1C in the last 72 hours. CBG: No results for input(s): GLUCAP in the last 168 hours. Lipid Profile: No results for input(s): CHOL, HDL, LDLCALC, TRIG, CHOLHDL, LDLDIRECT in the last 72 hours. Thyroid Function Tests: No results for input(s): TSH, T4TOTAL, FREET4, T3FREE, THYROIDAB in the last 72 hours. Anemia Panel: No results for input(s): VITAMINB12, FOLATE, FERRITIN, TIBC, IRON, RETICCTPCT in the last 72 hours. Urine analysis:    Component Value Date/Time   COLORURINE YELLOW 09/02/2017 1648   APPEARANCEUR CLEAR 09/02/2017 1648   LABSPEC 1.010 09/02/2017 1648   PHURINE 5.0 09/02/2017 1648   GLUCOSEU NEGATIVE 09/02/2017 1648   GLUCOSEU 250 (A) 03/16/2015 1612   HGBUR SMALL (A) 09/02/2017 1648   BILIRUBINUR NEGATIVE 09/02/2017 1648   KETONESUR NEGATIVE 09/02/2017 1648    PROTEINUR NEGATIVE 09/02/2017 1648   UROBILINOGEN 0.2  03/16/2015 1612   NITRITE NEGATIVE 09/02/2017 1648   LEUKOCYTESUR TRACE (A) 09/02/2017 1648    Radiological Exams on Admission: Dg Chest 2 View  Result Date: 12/28/2017 CLINICAL DATA:  Shortness of breath for 2 weeks. EXAM: CHEST - 2 VIEW COMPARISON:  September 01, 2017 FINDINGS: The heart size and mediastinal contours are within normal limits. Patchy consolidation of the right upper lobe is identified. The left lung is clear. There is no pleural effusion. The visualized skeletal structures are unremarkable. IMPRESSION: Right upper lobe pneumonia. Follow-up after treatment is recommended to ensure resolution. Electronically Signed   By: Abelardo Diesel M.D.   On: 12/28/2017 21:52    EKG: Independently reviewed. Sinus tachycardia, incomplete RBBB, no acute ischemic changes.  Assessment/Plan Principal Problem:   Acute on chronic respiratory failure with hypoxia ([name]) Active Problems:   Human immunodeficiency virus (HIV) disease ([name])   Diabetes mellitus type 2 with complications, uncontrolled ([place])   Hyperlipidemia associated with type 2 diabetes mellitus ([place])   COPD with acute bronchitis ([name])   Tobacco abuse   Obesity hypoventilation syndrome ([place])   Anxiety and depression   [name] is a 52 y.o. female with medical history significant for COPD/Asthma on 3L O2 via [place] at night, HIV on Genvoya, CKD Stage IV, IDT2DM, HLD, anxiety, chronic pain, tobacco use, and OHS presents to the ED with shortness of breath admitted for acute on chronic respiratory failure with hypoxia.  Acute on chronic respiratory failure with hypoxia: Multifactorial from right upper lobe pneumonia and COPD exacerbation. -IV ceftriaxone and azithromycin -Obtain blood cultures -Continue supplemental oxygen as needed, admit to stepdown unit for BiPAP if needed  COPD exacerbation: Diffuse expiratory wheezing present on admission. -Start IV Solu-Medrol  40 mg every 12 hours -Schedule duo nebs and as needed albuterol nebulizers -Antibiotics as above -Supplemental oxygen, maintain sats between 88-92% -BiPAP as needed  Insulin-dependent diabetes: On Toujeo 60 units twice a day and NovoLog with meals at home. -Lantus 40 units twice daily and SSI  HIV: On Genvoya.  Labs 05/21/2017 showed a CD4 770, RNA <20 detected. -Continue Genvoya  Anxiety/depression/chronic pain and neuropathy: Continue home Abilify, doxepin, Lyrica, Klonopin, Norco with hold parameters  Hyperlipidemia: -Continue rosuvastatin  Obesity hypoventilation syndrome: She states that she has not been on CPAP or BiPAP.  She wears 3 L supplemental O2 via nasal cannula at home. -Continue O2 at night  Tobacco use: Continues smoke 1 pack/day. -Smoking cessation counseling provided  DVT prophylaxis: subq heparin Code Status: DNR Family Communication: None present on admission Disposition Plan: Pending improvement in respiratory status and duration of IV antibiotics Consults called: None Admission status: Inpatient   Zada Finders MD [facility] Pager [phone]  If 7PM-7AM, please contact night-coverage [email or web address] Password [email or web address]  12/29/2017, 1:13 AM

## 2017-12-29 DIAGNOSIS — F329 Major depressive disorder, single episode, unspecified: Secondary | ICD-10-CM

## 2017-12-29 DIAGNOSIS — F419 Anxiety disorder, unspecified: Secondary | ICD-10-CM

## 2017-12-29 LAB — BASIC METABOLIC PANEL
Anion gap: 19 — ABNORMAL HIGH (ref 5–15)
BUN: 44 mg/dL — ABNORMAL HIGH (ref 6–20)
CO2: 19 mmol/L — ABNORMAL LOW (ref 22–32)
Calcium: 8.6 mg/dL — ABNORMAL LOW (ref 8.9–10.3)
Chloride: 96 mmol/L — ABNORMAL LOW (ref 98–111)
Creatinine, Ser: 2.06 mg/dL — ABNORMAL HIGH (ref 0.44–1.00)
GFR calc Af Amer: 31 mL/min — ABNORMAL LOW (ref >60)
GFR calc non Af Amer: 27 mL/min — ABNORMAL LOW (ref >60)
Glucose, Bld: 503 mg/dL (ref 70–99)
Potassium: 4.6 mmol/L (ref 3.5–5.1)
SODIUM: 134 mmol/L — AB (ref 135–145)

## 2017-12-29 LAB — HEMOGLOBIN A1C
Hgb A1c MFr Bld: 7.2 % — ABNORMAL HIGH (ref 4.8–5.6)
Mean Plasma Glucose: 159.94 mg/dL

## 2017-12-29 LAB — BLOOD GAS, ARTERIAL
Acid-base deficit: 0.6 mmol/L (ref 0.0–2.0)
Bicarbonate: 25.9 mmol/L (ref 20.0–28.0)
Drawn by: 103701
O2 Content: 4 L/min
O2 Saturation: 90.7 %
Patient temperature: 98.6
pCO2 arterial: 53.3 mmHg — ABNORMAL HIGH (ref 32.0–48.0)
pH, Arterial: 7.307 — ABNORMAL LOW (ref 7.350–7.450)
pO2, Arterial: 65.2 mmHg — ABNORMAL LOW (ref 83.0–108.0)

## 2017-12-29 LAB — GLUCOSE, CAPILLARY
Glucose-Capillary: 419 mg/dL — ABNORMAL HIGH (ref 70–99)
Glucose-Capillary: 427 mg/dL — ABNORMAL HIGH (ref 70–99)
Glucose-Capillary: 381 mg/dL — ABNORMAL HIGH (ref 70–99)
Glucose-Capillary: 279 mg/dL — ABNORMAL HIGH (ref 70–99)
Glucose-Capillary: 314 mg/dL — ABNORMAL HIGH (ref 70–99)
Glucose-Capillary: 354 mg/dL — ABNORMAL HIGH (ref 70–99)

## 2017-12-29 LAB — CBC
HCT: 43.5 % (ref 36.0–46.0)
HEMOGLOBIN: 12.9 g/dL (ref 12.0–15.0)
MCH: 28.8 pg (ref 26.0–34.0)
MCHC: 29.7 g/dL — ABNORMAL LOW (ref 30.0–36.0)
MCV: 97.1 fL (ref 80.0–100.0)
Platelets: 294 10*3/uL (ref 150–400)
RBC: 4.48 MIL/uL (ref 3.87–5.11)
RDW: 20.6 % — ABNORMAL HIGH (ref 11.5–15.5)
WBC: 21.3 10*3/uL — AB (ref 4.0–10.5)
nRBC: 0 % (ref 0.0–0.2)

## 2017-12-29 LAB — MRSA PCR SCREENING: MRSA by PCR: POSITIVE — AB

## 2017-12-29 MED ORDER — ONDANSETRON HCL 4 MG/2ML IJ SOLN: 4.0000 mg | Freq: Four times a day (QID) | INTRAMUSCULAR | Status: DC | PRN

## 2017-12-29 MED ORDER — HYDROCODONE-ACETAMINOPHEN 10-325 MG PO TABS
1.0000 | ORAL_TABLET | Freq: Three times a day (TID) | ORAL | Status: DC | PRN
  Administered 2017-12-29 – 2018-01-01 (×8): 1 via ORAL
  Filled 2017-12-29 (×8): qty 1

## 2017-12-29 MED ORDER — SENNA 8.6 MG PO TABS
1.0000 | ORAL_TABLET | Freq: Two times a day (BID) | ORAL | Status: DC
  Administered 2017-12-29 – 2017-12-31 (×6): 8.6 mg via ORAL
  Filled 2017-12-29 (×8): qty 1

## 2017-12-29 MED ORDER — INSULIN ASPART 100 UNIT/ML ~~LOC~~ SOLN
0.0000 [IU] | Freq: Three times a day (TID) | SUBCUTANEOUS | Status: DC
  Administered 2017-12-29: 11 [IU] via SUBCUTANEOUS
  Administered 2017-12-29: 20 [IU] via SUBCUTANEOUS
  Administered 2017-12-29: 11 [IU] via SUBCUTANEOUS
  Administered 2017-12-30: 15 [IU] via SUBCUTANEOUS
  Administered 2017-12-30 – 2017-12-31 (×4): 20 [IU] via SUBCUTANEOUS
  Administered 2017-12-31: 7 [IU] via SUBCUTANEOUS
  Administered 2018-01-01: 15 [IU] via SUBCUTANEOUS
  Administered 2018-01-01: 20 [IU] via SUBCUTANEOUS

## 2017-12-29 MED ORDER — SODIUM CHLORIDE 0.9 % IV SOLN
500.0000 mg | INTRAVENOUS | Status: DC
  Administered 2017-12-29 – 2017-12-31 (×3): 500 mg via INTRAVENOUS
  Filled 2017-12-29 (×4): qty 500

## 2017-12-29 MED ORDER — INSULIN ASPART 100 UNIT/ML ~~LOC~~ SOLN
15.0000 [IU] | Freq: Once | SUBCUTANEOUS | Status: AC
  Administered 2017-12-29: 15 [IU] via SUBCUTANEOUS

## 2017-12-29 MED ORDER — PREGABALIN 50 MG PO CAPS
100.0000 mg | ORAL_CAPSULE | Freq: Two times a day (BID) | ORAL | Status: DC
  Administered 2017-12-29 – 2018-01-01 (×8): 100 mg via ORAL
  Filled 2017-12-29 (×8): qty 2

## 2017-12-29 MED ORDER — METHYLPREDNISOLONE SODIUM SUCC 40 MG IJ SOLR
40.0000 mg | Freq: Two times a day (BID) | INTRAMUSCULAR | Status: DC
  Administered 2017-12-29 – 2017-12-31 (×6): 40 mg via INTRAVENOUS
  Filled 2017-12-29 (×6): qty 1

## 2017-12-29 MED ORDER — SODIUM CHLORIDE 0.9 % IV SOLN
1.0000 g | INTRAVENOUS | Status: DC
  Administered 2017-12-29 – 2017-12-31 (×3): 1 g via INTRAVENOUS
  Filled 2017-12-29 (×3): qty 1
  Filled 2017-12-29: qty 10

## 2017-12-29 MED ORDER — INSULIN ASPART 100 UNIT/ML ~~LOC~~ SOLN
0.0000 [IU] | Freq: Every day | SUBCUTANEOUS | Status: DC
  Administered 2017-12-29 (×2): 5 [IU] via SUBCUTANEOUS
  Administered 2017-12-30 – 2017-12-31 (×2): 4 [IU] via SUBCUTANEOUS

## 2017-12-29 MED ORDER — FLUTICASONE FUROATE-VILANTEROL 100-25 MCG/INH IN AEPB
1.0000 | INHALATION_SPRAY | Freq: Every day | RESPIRATORY_TRACT | Status: DC
  Administered 2017-12-29 – 2018-01-01 (×3): 1 via RESPIRATORY_TRACT
  Filled 2017-12-29 (×2): qty 28

## 2017-12-29 MED ORDER — MUPIROCIN 2 % EX OINT
1.0000 "application " | TOPICAL_OINTMENT | Freq: Two times a day (BID) | CUTANEOUS | Status: DC
  Administered 2017-12-29 – 2018-01-01 (×7): 1 via NASAL
  Filled 2017-12-29: qty 22

## 2017-12-29 MED ORDER — DOXEPIN HCL 10 MG PO CAPS
10.0000 mg | ORAL_CAPSULE | Freq: Every day | ORAL | Status: DC
  Administered 2017-12-29 – 2017-12-31 (×4): 10 mg via ORAL
  Filled 2017-12-29 (×5): qty 1

## 2017-12-29 MED ORDER — IPRATROPIUM-ALBUTEROL 0.5-2.5 (3) MG/3ML IN SOLN
3.0000 mL | Freq: Four times a day (QID) | RESPIRATORY_TRACT | Status: DC
  Administered 2017-12-29 – 2018-01-01 (×14): 3 mL via RESPIRATORY_TRACT
  Filled 2017-12-29 (×11): qty 3
  Filled 2017-12-29: qty 42
  Filled 2017-12-29 (×2): qty 3

## 2017-12-29 MED ORDER — ACETAMINOPHEN 650 MG RE SUPP: 650.0000 mg | Freq: Four times a day (QID) | RECTAL | Status: DC | PRN

## 2017-12-29 MED ORDER — SODIUM CHLORIDE 0.9% FLUSH
3.0000 mL | Freq: Two times a day (BID) | INTRAVENOUS | Status: DC
  Administered 2017-12-29 – 2018-01-01 (×7): 3 mL via INTRAVENOUS

## 2017-12-29 MED ORDER — ONDANSETRON HCL 4 MG PO TABS: 4.0000 mg | ORAL_TABLET | Freq: Four times a day (QID) | ORAL | Status: DC | PRN

## 2017-12-29 MED ORDER — CLONAZEPAM 0.5 MG PO TABS
0.2500 mg | ORAL_TABLET | Freq: Two times a day (BID) | ORAL | Status: DC | PRN
  Administered 2017-12-29 – 2017-12-31 (×4): 0.25 mg via ORAL
  Filled 2017-12-29 (×4): qty 1

## 2017-12-29 MED ORDER — INSULIN GLARGINE 100 UNIT/ML ~~LOC~~ SOLN
40.0000 [IU] | Freq: Two times a day (BID) | SUBCUTANEOUS | Status: DC
  Administered 2017-12-29 – 2017-12-31 (×6): 40 [IU] via SUBCUTANEOUS
  Filled 2017-12-29 (×7): qty 0.4

## 2017-12-29 MED ORDER — ROSUVASTATIN CALCIUM 20 MG PO TABS
20.0000 mg | ORAL_TABLET | Freq: Every day | ORAL | Status: DC
  Administered 2017-12-29 – 2017-12-31 (×3): 20 mg via ORAL
  Filled 2017-12-29 (×3): qty 1

## 2017-12-29 MED ORDER — ELVITEG-COBIC-EMTRICIT-TENOFAF 150-150-200-10 MG PO TABS
1.0000 | ORAL_TABLET | Freq: Every day | ORAL | Status: DC
  Administered 2017-12-29 – 2018-01-01 (×4): 1 via ORAL
  Filled 2017-12-29 (×4): qty 1

## 2017-12-29 MED ORDER — PANTOPRAZOLE SODIUM 40 MG PO TBEC
40.0000 mg | DELAYED_RELEASE_TABLET | Freq: Every day | ORAL | Status: DC
  Administered 2017-12-29 – 2018-01-01 (×4): 40 mg via ORAL
  Filled 2017-12-29 (×4): qty 1

## 2017-12-29 MED ORDER — ACETAMINOPHEN 325 MG PO TABS
650.0000 mg | ORAL_TABLET | Freq: Four times a day (QID) | ORAL | Status: DC | PRN
  Administered 2017-12-31 (×2): 650 mg via ORAL
  Filled 2017-12-29 (×2): qty 2

## 2017-12-29 MED ORDER — ALBUTEROL SULFATE (2.5 MG/3ML) 0.083% IN NEBU: 2.5000 mg | INHALATION_SOLUTION | RESPIRATORY_TRACT | Status: DC | PRN

## 2017-12-29 MED ORDER — ARIPIPRAZOLE 5 MG PO TABS
15.0000 mg | ORAL_TABLET | Freq: Every day | ORAL | Status: DC
  Administered 2017-12-29 – 2018-01-01 (×4): 15 mg via ORAL
  Filled 2017-12-29 (×4): qty 1

## 2017-12-29 MED ORDER — HEPARIN SODIUM (PORCINE) 5000 UNIT/ML IJ SOLN
5000.0000 [IU] | Freq: Three times a day (TID) | INTRAMUSCULAR | Status: DC
  Administered 2017-12-29 – 2018-01-01 (×10): 5000 [IU] via SUBCUTANEOUS
  Filled 2017-12-29 (×9): qty 1

## 2017-12-29 MED ORDER — CHLORHEXIDINE GLUCONATE CLOTH 2 % EX PADS
6.0000 | MEDICATED_PAD | Freq: Every day | CUTANEOUS | Status: DC
  Administered 2017-12-29 – 2018-01-01 (×3): 6 via TOPICAL

## 2017-12-29 NOTE — Progress Notes (Signed)
Offered CPAP/Bipap to patient who refuses at this time.  Will be available if she changes her mind.  Encouraged patient to call.

## 2017-12-29 NOTE — Progress Notes (Signed)
CRITICAL VALUE ALERT  Critical Value:  CBG 503  Date & Time Notied:  12/29/2017 0502  Provider Notified: Silas [place]  Orders Received/Actions taken: 15u Novolog orders received

## 2017-12-29 NOTE — ED Notes (Signed)
Two failed attempts to start second IV.

## 2017-12-29 NOTE — Progress Notes (Signed)
PROGRESS NOTE    [name]  PJA:[id number] DOB: 06-Aug-1965 DOA: 12/28/2017 PCP: [name], PA-C  Outpatient Specialists:   Brief Narrative:  As by H&P "[name] is a 52 y.o. female with medical history significant for COPD/Asthma on 3L O2 via [place] at night, HIV on Genvoya, CKD Stage IV, IDT2DM, HLD, anxiety, chronic pain, tobacco use, and OHS presents to the ED with shortness of breath.  Patient states that she had new onset severe shortness of breath beginning this morning with mild relief from her home nebulizers.  She reports a cough productive of white sputum.  Due to continued symptoms she called EMS who provided Atrovent, albuterol, and IV Solu-Medrol on route to the ED.  She denies any fevers, chills, diaphoresis, chest pain, abdominal pain, or dysuria.  ED Course:  Initial vitals showed BP 133/80, pulse 106, RR 24, temp 98.8 Fahrenheit, SPO2 90% on 6 L supplemental O2 via nasal cannula. Labs are notable for WBC 26.9, BUN 34, creatinine 1.73 (baseline between 1.5-1.6). VBG pH 7.36, PCO2 52, PO2 on reportable on 6 L O2 via nasal cannula. I-STAT troponin 0.03.  2 view chest x-ray showed right upper lobe pneumonia.  Patient was started on IV Levaquin and ceftriaxone and given a 1 hour continuous nebulizer.  The hospital service was consulted to admit for further management".  12/29/2017: Patient seen alongside patient's nurse.  Patient is quite drowsy.  We will get a stat ABG.  Uncontrolled blood sugar is noted.  Will optimize blood sugar control.  Management will depend on hospital course.    Assessment & Plan:   Principal Problem:   Acute on chronic respiratory failure with hypoxia ([name]) Active Problems:   Human immunodeficiency virus (HIV) disease ([name])   Diabetes mellitus type 2 with complications, uncontrolled ([place])   Hyperlipidemia associated with type 2 diabetes mellitus ([name])   COPD with acute bronchitis ([name])   Tobacco abuse   Obesity  hypoventilation syndrome ([name])   Anxiety and depression   Acute on chronic respiratory failure with hypoxia ([name]) Active Problems:   Human immunodeficiency virus (HIV) disease ([place])   Diabetes mellitus type 2 with complications, uncontrolled ([place])   Hyperlipidemia associated with type 2 diabetes mellitus ([place])   COPD with acute bronchitis ([name])   Tobacco abuse   Obesity hypoventilation syndrome ([place])   Anxiety and depression   [name] is a 52 y.o. female with medical history significant for COPD/Asthma on 3L O2 via [place] at night, HIV on Genvoya, CKD Stage IV, IDT2DM, HLD, anxiety, chronic pain, tobacco use, and OHS presents to the ED with shortness of breath admitted for acute on chronic respiratory failure with hypoxia.  Acute on chronic respiratory failure with hypoxia: Multifactorial from right upper lobe pneumonia and COPD exacerbation. -IV ceftriaxone and azithromycin -Obtain blood cultures -Continue supplemental oxygen as needed, admit to stepdown unit for BiPAP if needed 12/29/2017: Start ABG.  Further management will depend on the results of the PCO2.  Patient may need BiPAP.  COPD exacerbation: Diffuse expiratory wheezing present on admission. -Start IV Solu-Medrol 40 mg every 12 hours -Schedule duo nebs and as needed albuterol nebulizers -Antibiotics as above -Supplemental oxygen, maintain sats between 88-92% -BiPAP as needed 12/29/2017: Continue current management.  Insulin-dependent diabetes: On [place] 60 units twice a day and NovoLog with meals at home. -Lantus 40 units twice daily and SSI 12/29/2017: Continue to optimize.  Query role of steroids.  HIV: On Genvoya.  Labs 05/21/2017 showed a CD4 770, RNA <  20 detected. -Continue Genvoya  Anxiety/depression/chronic pain and neuropathy: Continue home Abilify, doxepin, Lyrica, Klonopin, Norco with hold parameters  Hyperlipidemia: -Continue rosuvastatin  Obesity hypoventilation syndrome: She states  that she has not been on CPAP or BiPAP.  She wears 3 L supplemental O2 via nasal cannula at home. -Continue O2 at night  Tobacco use: Continues smoke 1 pack/day. -Smoking cessation counseling provided  DVT prophylaxis: subq heparin Code Status: DNR Family Communication: None present on admission Disposition Plan: Pending improvement in respiratory status and duration of IV antibiotics Consults called: None  Procedures:   None  Antimicrobials:   Azithromycin  Rocephin  Subjective: No significant history from the patient as patient is lethargic/drowsy.  Objective: Vitals:   12/29/17 0500 12/29/17 0600 12/29/17 0700 12/29/17 0800  BP: (!) 108/47 (!) 127/46 (!) 145/53   Pulse: 92 94 92   Resp: 18 19 17    Temp:    97.6 F (36.4 C)  TempSrc:    Oral  SpO2: (!) 87% 90% 96%   Weight:      Height:        Intake/Output Summary (Last 24 hours) at 12/29/2017 0934 Last data filed at 12/29/2017 0200 Gross per 24 hour  Intake 195.34 ml  Output -  Net 195.34 ml   Filed Weights   12/29/17 0100  Weight: 107 kg    Examination:  General exam: Appears calm and comfortable.  Obese. Respiratory system: Decreased air entry globally, with expiratory wheeze.   Cardiovascular system: S1 & S2. Gastrointestinal system: Abdomen is obese, soft and nontender.  Organs are difficult to assess.   Central nervous system: Lethargic/drowsy.    Data Reviewed: I have personally reviewed following labs and imaging studies  CBC: Recent Labs  Lab 12/28/17 2109 12/29/17 0401  WBC 26.9* 21.3*  HGB 13.6 12.9  HCT 45.4 43.5  MCV 95.8 97.1  PLT 308 354   Basic Metabolic Panel: Recent Labs  Lab 12/28/17 2109 12/29/17 0401  NA 141 134*  K 4.1 4.6  CL 102 96*  CO2 26 19*  GLUCOSE 241* 503*  BUN 34* 44*  CREATININE 1.73* 2.06*  CALCIUM 9.0 8.6*   GFR: Estimated Creatinine Clearance: 41.8 mL/min (A) (by C-G formula based on SCr of 2.06 mg/dL (H)). Liver Function Tests: Recent  Labs  Lab 12/28/17 2109  AST 34  ALT 19  ALKPHOS 60  BILITOT 0.6  PROT 6.4*  ALBUMIN 3.3*   No results for input(s): LIPASE, AMYLASE in the last 168 hours. No results for input(s): AMMONIA in the last 168 hours. Coagulation Profile: No results for input(s): INR, PROTIME in the last 168 hours. Cardiac Enzymes: No results for input(s): CKTOTAL, CKMB, CKMBINDEX, TROPONINI in the last 168 hours. BNP (last 3 results) No results for input(s): PROBNP in the last 8760 hours. HbA1C: No results for input(s): HGBA1C in the last 72 hours. CBG: Recent Labs  Lab 12/29/17 0157 12/29/17 0745  GLUCAP 419* 427*   Lipid Profile: No results for input(s): CHOL, HDL, LDLCALC, TRIG, CHOLHDL, LDLDIRECT in the last 72 hours. Thyroid Function Tests: No results for input(s): TSH, T4TOTAL, FREET4, T3FREE, THYROIDAB in the last 72 hours. Anemia Panel: No results for input(s): VITAMINB12, FOLATE, FERRITIN, TIBC, IRON, RETICCTPCT in the last 72 hours. Urine analysis:    Component Value Date/Time   COLORURINE YELLOW 09/02/2017 1648   APPEARANCEUR CLEAR 09/02/2017 1648   LABSPEC 1.010 09/02/2017 1648   PHURINE 5.0 09/02/2017 1648   GLUCOSEU NEGATIVE 09/02/2017 1648   GLUCOSEU 250 (  A) 03/16/2015 1612   HGBUR SMALL (A) 09/02/2017 1648   BILIRUBINUR NEGATIVE 09/02/2017 1648   KETONESUR NEGATIVE 09/02/2017 1648   PROTEINUR NEGATIVE 09/02/2017 1648   UROBILINOGEN 0.2 03/16/2015 1612   NITRITE NEGATIVE 09/02/2017 1648   LEUKOCYTESUR TRACE (A) 09/02/2017 1648   Sepsis Labs: @LABRCNTIP (procalcitonin:4,lacticidven:4)  )No results found for this or any previous visit (from the past 240 hour(s)).       Radiology Studies: Dg Chest 2 View  Result Date: 12/28/2017 CLINICAL DATA:  Shortness of breath for 2 weeks. EXAM: CHEST - 2 VIEW COMPARISON:  September 01, 2017 FINDINGS: The heart size and mediastinal contours are within normal limits. Patchy consolidation of the right upper lobe is identified. The  left lung is clear. There is no pleural effusion. The visualized skeletal structures are unremarkable. IMPRESSION: Right upper lobe pneumonia. Follow-up after treatment is recommended to ensure resolution. Electronically Signed   By: Abelardo Diesel M.D.   On: 12/28/2017 21:52        Scheduled Meds: . ARIPiprazole  15 mg Oral Daily  . doxepin  10 mg Oral QHS  . elvitegravir-cobicistat-emtricitabine-tenofovir  1 tablet Oral Q breakfast  . fluticasone furoate-vilanterol  1 puff Inhalation Daily  . heparin  5,000 Units Subcutaneous Q8H  . insulin aspart  0-20 Units Subcutaneous TID WC  . insulin aspart  0-5 Units Subcutaneous QHS  . insulin glargine  40 Units Subcutaneous BID  . ipratropium-albuterol  3 mL Nebulization Q6H  . methylPREDNISolone (SOLU-MEDROL) injection  40 mg Intravenous Q12H  . pantoprazole  40 mg Oral Daily  . pregabalin  100 mg Oral BID  . rosuvastatin  20 mg Oral q1800  . senna  1 tablet Oral BID  . sodium chloride flush  3 mL Intravenous Q12H   Continuous Infusions: . azithromycin    . cefTRIAXone (ROCEPHIN)  IV       LOS: 1 day    Time spent: 35 minutes.    Dana Allan, MD  [facility] Pager #: [phone] 7PM-7AM contact night coverage as above

## 2017-12-30 LAB — CBC WITH DIFFERENTIAL/PLATELET
Abs Immature Granulocytes: 0.24 10*3/uL — ABNORMAL HIGH (ref 0.00–0.07)
Basophils Absolute: 0 10*3/uL (ref 0.0–0.1)
Basophils Relative: 0 %
Eosinophils Absolute: 0 10*3/uL (ref 0.0–0.5)
Eosinophils Relative: 0 %
HCT: 37.1 % (ref 36.0–46.0)
Hemoglobin: 11.5 g/dL — ABNORMAL LOW (ref 12.0–15.0)
Immature Granulocytes: 1 %
Lymphocytes Relative: 3 %
Lymphs Abs: 0.6 10*3/uL — ABNORMAL LOW (ref 0.7–4.0)
MCH: 29 pg (ref 26.0–34.0)
MCHC: 31 g/dL (ref 30.0–36.0)
MCV: 93.5 fL (ref 80.0–100.0)
Monocytes Absolute: 0.5 10*3/uL (ref 0.1–1.0)
Monocytes Relative: 2 %
Neutro Abs: 18.6 10*3/uL — ABNORMAL HIGH (ref 1.7–7.7)
Neutrophils Relative %: 94 %
Platelets: 288 10*3/uL (ref 150–400)
RBC: 3.97 MIL/uL (ref 3.87–5.11)
RDW: 20.2 % — ABNORMAL HIGH (ref 11.5–15.5)
WBC: 20 10*3/uL — ABNORMAL HIGH (ref 4.0–10.5)
nRBC: 0 % (ref 0.0–0.2)

## 2017-12-30 LAB — GLUCOSE, CAPILLARY
GLUCOSE-CAPILLARY: 319 mg/dL — AB (ref 70–99)
Glucose-Capillary: 357 mg/dL — ABNORMAL HIGH (ref 70–99)
Glucose-Capillary: 361 mg/dL — ABNORMAL HIGH (ref 70–99)
Glucose-Capillary: 329 mg/dL — ABNORMAL HIGH (ref 70–99)

## 2017-12-30 LAB — RENAL FUNCTION PANEL
Albumin: 2.9 g/dL — ABNORMAL LOW (ref 3.5–5.0)
Anion gap: 12 (ref 5–15)
BUN: 53 mg/dL — ABNORMAL HIGH (ref 6–20)
CO2: 24 mmol/L (ref 22–32)
Calcium: 8.7 mg/dL — ABNORMAL LOW (ref 8.9–10.3)
Chloride: 101 mmol/L (ref 98–111)
Creatinine, Ser: 1.57 mg/dL — ABNORMAL HIGH (ref 0.44–1.00)
GFR calc Af Amer: 43 mL/min — ABNORMAL LOW (ref >60)
GFR calc non Af Amer: 37 mL/min — ABNORMAL LOW (ref >60)
Glucose, Bld: 354 mg/dL — ABNORMAL HIGH (ref 70–99)
Phosphorus: 3.3 mg/dL (ref 2.5–4.6)
Potassium: 4.7 mmol/L (ref 3.5–5.1)
Sodium: 137 mmol/L (ref 135–145)

## 2017-12-30 LAB — MAGNESIUM: Magnesium: 2.1 mg/dL (ref 1.7–2.4)

## 2017-12-30 MED ORDER — ORAL CARE MOUTH RINSE
15.0000 mL | Freq: Two times a day (BID) | OROMUCOSAL | Status: DC
  Administered 2017-12-30: 15 mL via OROMUCOSAL

## 2017-12-30 MED ORDER — GUAIFENESIN-DM 100-10 MG/5ML PO SYRP
5.0000 mL | ORAL_SOLUTION | ORAL | Status: DC | PRN
  Administered 2017-12-30 – 2018-01-01 (×4): 5 mL via ORAL
  Filled 2017-12-30 (×4): qty 10

## 2017-12-30 NOTE — Care Management Note (Signed)
Case Management Note  Patient Details  Name: [name] MRN: [id number] Date of Birth: 1965/02/12  Subjective/Objective:     pneumonia              Discharge readiness is indicated by patient meeting Recovery Milestones, including ALL of the following: ? Hemodynamic stability yes ? Tachypnea absent resp rate 20 ? Hypoxemia absent o2 via nasal cannula ? Afebrile, or temperature acceptable for next level of care  98.2 ? Oxygen absent or at baseline need ? No  ? Mental status at baseline yes ? Antibiotic regimen acceptable for next level of care-no iv zithromax and iv rocephin ? Ambulatory with assistance ? Oral hydration, medications, and diet ? See above   Action/Plan: Will follow for progression  Expected Discharge Date:                  Expected Discharge Plan:     In-House Referral:     Discharge planning Services     Post Acute Care Choice:    Choice offered to:     DME Arranged:    DME Agency:     HH Arranged:    HH Agency:     Status of Service:     If discussed at [employer] of Stay Meetings, dates discussed:    Additional Comments:  Leeroy Cha, RN 12/30/2017, 1:36 PM

## 2017-12-30 NOTE — Progress Notes (Signed)
Patient arrived to the unit. Patient is alert and in no distress. Patient placed on telemetry box 1446. Called CCMD to verify. Agree with prior nursing assessment on patient.

## 2017-12-30 NOTE — Progress Notes (Signed)
PROGRESS NOTE    [name]  CMK:[id number] DOB: 1965-07-11 DOA: 12/28/2017 PCP: [name], PA-C  Outpatient Specialists:   Brief Narrative:  As by H&P "BUFORD GAYLER is a 52 y.o. female with medical history significant for COPD/Asthma on 3L O2 via [place] at night, HIV on Genvoya, CKD Stage IV, IDT2DM, HLD, anxiety, chronic pain, tobacco use, and OHS presents to the ED with shortness of breath.  Patient states that she had new onset severe shortness of breath beginning this morning with mild relief from her home nebulizers.  She reports a cough productive of white sputum.  Due to continued symptoms she called EMS who provided Atrovent, albuterol, and IV Solu-Medrol on route to the ED.  She denies any fevers, chills, diaphoresis, chest pain, abdominal pain, or dysuria.  ED Course:  Initial vitals showed BP 133/80, pulse 106, RR 24, temp 98.8 Fahrenheit, SPO2 90% on 6 L supplemental O2 via nasal cannula. Labs are notable for WBC 26.9, BUN 34, creatinine 1.73 (baseline between 1.5-1.6). VBG pH 7.36, PCO2 52, PO2 on reportable on 6 L O2 via nasal cannula. I-STAT troponin 0.03.  2 view chest x-ray showed right upper lobe pneumonia.  Patient was started on IV Levaquin and ceftriaxone and given a 1 hour continuous nebulizer.  The hospital service was consulted to admit for further management".  12/29/2017: Patient seen alongside patient's nurse.  Patient is quite drowsy.  We will get a stat ABG.  Uncontrolled blood sugar is noted.  Will optimize blood sugar control.  Management will depend on hospital course.  12/30/2017: Patient seen.  Apparently, patient refused CPAP and BiPAP last night.  Patient is not keen on using a diet CPAP or BiPAP.  Patient has continued to improve.  Transfer patient to telemetry floor.  Assessment & Plan:   Principal Problem:   Acute on chronic respiratory failure with hypoxia ([name]) Active Problems:   Human immunodeficiency virus (HIV) disease ([name])   Diabetes mellitus type 2 with complications, uncontrolled ([place])   Hyperlipidemia associated with type 2 diabetes mellitus ([name])   COPD with acute bronchitis ([name])   Tobacco abuse   Obesity hypoventilation syndrome ([name])   Anxiety and depression   Acute on chronic respiratory failure with hypoxia ([name]) Active Problems:   Human immunodeficiency virus (HIV) disease ([place])   Diabetes mellitus type 2 with complications, uncontrolled ([place])   Hyperlipidemia associated with type 2 diabetes mellitus ([place])   COPD with acute bronchitis ([name])   Tobacco abuse   Obesity hypoventilation syndrome ([place])   Anxiety and depression   [name] is a 52 y.o. female with medical history significant for COPD/Asthma on 3L O2 via [place] at night, HIV on Genvoya, CKD Stage IV, IDT2DM, HLD, anxiety, chronic pain, tobacco use, and OHS presents to the ED with shortness of breath admitted for acute on chronic respiratory failure with hypoxia.  Acute on chronic respiratory failure with hypoxia: Multifactorial from right upper lobe pneumonia and COPD exacerbation. -IV ceftriaxone and azithromycin -Follow obtain blood cultures -Continue supplemental oxygen as needed -BiPAP needed, if patient will agree.  l  -Further management will depend on hospital course.  COPD exacerbation: -Continue IV Solu-Medrol 40 mg every 12 hours. -Schedule duo nebs and as needed albuterol nebulizers -Antibiotics as above -Supplemental oxygen, maintain sats between 88-92% -BiPAP as needed  Insulin-dependent diabetes. -Lantus 40 units twice daily and SSI -Continue to optimize.   - Query role of steroids.  HIV: On Genvoya.  Labs 05/21/2017 showed a CD4  770, RNA <20 detected. -Continue Genvoya  Anxiety/depression/chronic pain and neuropathy: Continue home Abilify, doxepin, Lyrica, Klonopin, Norco with hold parameters  Hyperlipidemia: -Continue rosuvastatin  Obesity hypoventilation syndrome: She states that she has  not been on CPAP or BiPAP.  She wears 3 L supplemental O2 via nasal cannula at home. -Continue O2 at night  Tobacco use: Continues to smoke 1 pack/day. -Smoking cessation counseling provided  DVT prophylaxis: subq heparin Code Status: DNR Family Communication: None present on admission Disposition Plan: Pending improvement in respiratory status and duration of IV antibiotics Consults called: None  Procedures:   None  Antimicrobials:   Azithromycin  Rocephin  Subjective: No fever or chills. Shortness of breath has improved significantly.  Objective: Vitals:   12/30/17 0700 12/30/17 0740 12/30/17 0810 12/30/17 0917  BP: (!) 157/86     Pulse: 75  97   Resp: 13  18   Temp:  98 F (36.7 C)    TempSrc:  Oral    SpO2: 96%  93% 95%  Weight:      Height:        Intake/Output Summary (Last 24 hours) at 12/30/2017 1121 Last data filed at 12/30/2017 0900 Gross per 24 hour  Intake 795.65 ml  Output 1750 ml  Net -954.35 ml   Filed Weights   12/29/17 0100 12/30/17 0500  Weight: 107 kg 107.1 kg    Examination:  General exam: Appears calm and comfortable.  Obese. Respiratory system: Decreased air entry globally, with expiratory wheeze.   Cardiovascular system: S1 & S2. Gastrointestinal system: Abdomen is obese, soft and nontender.  Organs are difficult to assess.   Central nervous system: Lethargic/drowsy.    Data Reviewed: I have personally reviewed following labs and imaging studies  CBC: Recent Labs  Lab 12/28/17 2109 12/29/17 0401 12/30/17 0319  WBC 26.9* 21.3* 20.0*  NEUTROABS  --   --  18.6*  HGB 13.6 12.9 11.5*  HCT 45.4 43.5 37.1  MCV 95.8 97.1 93.5  PLT 308 294 322   Basic Metabolic Panel: Recent Labs  Lab 12/28/17 2109 12/29/17 0401 12/30/17 0319  NA 141 134* 137  K 4.1 4.6 4.7  CL 102 96* 101  CO2 26 19* 24  GLUCOSE 241* 503* 354*  BUN 34* 44* 53*  CREATININE 1.73* 2.06* 1.57*  CALCIUM 9.0 8.6* 8.7*  MG  --   --  2.1  PHOS  --    --  3.3   GFR: Estimated Creatinine Clearance: 54.9 mL/min (A) (by C-G formula based on SCr of 1.57 mg/dL (H)). Liver Function Tests: Recent Labs  Lab 12/28/17 2109 12/30/17 0319  AST 34  --   ALT 19  --   ALKPHOS 60  --   BILITOT 0.6  --   PROT 6.4*  --   ALBUMIN 3.3* 2.9*   No results for input(s): LIPASE, AMYLASE in the last 168 hours. No results for input(s): AMMONIA in the last 168 hours. Coagulation Profile: No results for input(s): INR, PROTIME in the last 168 hours. Cardiac Enzymes: No results for input(s): CKTOTAL, CKMB, CKMBINDEX, TROPONINI in the last 168 hours. BNP (last 3 results) No results for input(s): PROBNP in the last 8760 hours. HbA1C: Recent Labs    12/29/17 0356  HGBA1C 7.2*   CBG: Recent Labs  Lab 12/29/17 0915 12/29/17 1200 12/29/17 1650 12/29/17 2139 12/30/17 0742  GLUCAP 381* 279* 314* 354* 357*   Lipid Profile: No results for input(s): CHOL, HDL, LDLCALC, TRIG, CHOLHDL, LDLDIRECT in the last  72 hours. Thyroid Function Tests: No results for input(s): TSH, T4TOTAL, FREET4, T3FREE, THYROIDAB in the last 72 hours. Anemia Panel: No results for input(s): VITAMINB12, FOLATE, FERRITIN, TIBC, IRON, RETICCTPCT in the last 72 hours. Urine analysis:    Component Value Date/Time   COLORURINE YELLOW 09/02/2017 1648   APPEARANCEUR CLEAR 09/02/2017 1648   LABSPEC 1.010 09/02/2017 1648   PHURINE 5.0 09/02/2017 1648   GLUCOSEU NEGATIVE 09/02/2017 1648   GLUCOSEU 250 (A) 03/16/2015 1612   HGBUR SMALL (A) 09/02/2017 1648   BILIRUBINUR NEGATIVE 09/02/2017 1648   KETONESUR NEGATIVE 09/02/2017 1648   PROTEINUR NEGATIVE 09/02/2017 1648   UROBILINOGEN 0.2 03/16/2015 1612   NITRITE NEGATIVE 09/02/2017 1648   LEUKOCYTESUR TRACE (A) 09/02/2017 1648   Sepsis Labs: @LABRCNTIP (procalcitonin:4,lacticidven:4)  ) Recent Results (from the past 240 hour(s))  MRSA PCR Screening     Status: Abnormal   Collection Time: 12/29/17  4:44 AM  Result Value Ref  Range Status   MRSA by PCR POSITIVE (A) NEGATIVE Final    Comment: RESULT CALLED TO, READ BACK BY AND VERIFIED WITH: G.ETLING,RN [id number] @1028  BY V.WILKINS approved for NASAL specimens only), is one component of a comprehensive MRSA colonization surveillance program. It is not intended to diagnose MRSA infection nor to guide or monitor treatment for MRSA infections. Performed at [facility], [place] [address], [place], [place] [address]          Radiology Studies: Dg Chest 2 View  Result Date: 12/28/2017 CLINICAL DATA:  Shortness of breath for 2 weeks. EXAM: CHEST - 2 VIEW COMPARISON:  September 01, 2017 FINDINGS: The heart size and mediastinal contours are within normal limits. Patchy consolidation of the right upper lobe is identified. The left lung is clear. There is no pleural effusion. The visualized skeletal structures are unremarkable. IMPRESSION: Right upper lobe pneumonia. Follow-up after treatment is recommended to ensure resolution. Electronically Signed   By: Abelardo Diesel M.D.   On: 12/28/2017 21:52        Scheduled Meds: . ARIPiprazole  15 mg Oral Daily  . Chlorhexidine Gluconate Cloth  6 each Topical Q0600  . doxepin  10 mg Oral QHS  . elvitegravir-cobicistat-emtricitabine-tenofovir  1 tablet Oral Q breakfast  . fluticasone furoate-vilanterol  1 puff Inhalation Daily  . heparin  5,000 Units Subcutaneous Q8H  . insulin aspart  0-20 Units Subcutaneous TID WC  . insulin aspart  0-5 Units Subcutaneous QHS  . insulin glargine  40 Units Subcutaneous BID  . ipratropium-albuterol  3 mL Nebulization Q6H  . mouth rinse  15 mL Mouth Rinse BID  . methylPREDNISolone (SOLU-MEDROL) injection  40 mg Intravenous Q12H  . mupirocin ointment  1 application Nasal BID  . pantoprazole  40 mg Oral Daily  . pregabalin  100 mg Oral BID  . rosuvastatin  20 mg Oral q1800  . senna  1 tablet Oral BID  . sodium chloride flush  3 mL Intravenous Q12H   Continuous  Infusions: . azithromycin Stopped (12/29/17 2034)  . cefTRIAXone (ROCEPHIN)  IV Stopped (12/29/17 1923)     LOS: 2 days    Time spent: 35 minutes.    Dana Allan, MD  [facility] Pager #: [phone] 7PM-7AM contact night coverage as above

## 2017-12-31 DIAGNOSIS — J9621 Acute and chronic respiratory failure with hypoxia: Secondary | ICD-10-CM

## 2017-12-31 LAB — RENAL FUNCTION PANEL
Albumin: 3 g/dL — ABNORMAL LOW (ref 3.5–5.0)
Anion gap: 11 (ref 5–15)
BUN: 45 mg/dL — ABNORMAL HIGH (ref 6–20)
CO2: 26 mmol/L (ref 22–32)
Calcium: 8.9 mg/dL (ref 8.9–10.3)
Chloride: 99 mmol/L (ref 98–111)
Creatinine, Ser: 1.21 mg/dL — ABNORMAL HIGH (ref 0.44–1.00)
GFR calc Af Amer: 59 mL/min — ABNORMAL LOW (ref >60)
GFR calc non Af Amer: 51 mL/min — ABNORMAL LOW (ref >60)
Glucose, Bld: 431 mg/dL — ABNORMAL HIGH (ref 70–99)
Phosphorus: 2.6 mg/dL (ref 2.5–4.6)
Potassium: 4.4 mmol/L (ref 3.5–5.1)
Sodium: 136 mmol/L (ref 135–145)

## 2017-12-31 LAB — GLUCOSE, CAPILLARY
GLUCOSE-CAPILLARY: 224 mg/dL — AB (ref 70–99)
Glucose-Capillary: 374 mg/dL — ABNORMAL HIGH (ref 70–99)
Glucose-Capillary: 413 mg/dL — ABNORMAL HIGH (ref 70–99)
Glucose-Capillary: 332 mg/dL — ABNORMAL HIGH (ref 70–99)

## 2017-12-31 MED ORDER — PREDNISONE 20 MG PO TABS: 40.0000 mg | ORAL_TABLET | Freq: Every day | ORAL | Status: DC

## 2017-12-31 MED ORDER — INSULIN ASPART 100 UNIT/ML ~~LOC~~ SOLN
3.0000 [IU] | Freq: Three times a day (TID) | SUBCUTANEOUS | Status: DC
  Administered 2017-12-31 (×2): 3 [IU] via SUBCUTANEOUS

## 2017-12-31 MED ORDER — PREDNISONE 20 MG PO TABS
40.0000 mg | ORAL_TABLET | Freq: Every day | ORAL | Status: DC
  Administered 2018-01-01: 40 mg via ORAL
  Filled 2017-12-31: qty 2

## 2017-12-31 MED ORDER — INSULIN GLARGINE 100 UNIT/ML ~~LOC~~ SOLN
45.0000 [IU] | Freq: Two times a day (BID) | SUBCUTANEOUS | Status: DC
  Administered 2017-12-31: 45 [IU] via SUBCUTANEOUS
  Filled 2017-12-31 (×2): qty 0.45

## 2017-12-31 NOTE — Progress Notes (Signed)
Inpatient Diabetes Program Recommendations  AACE/ADA: New Consensus Statement on Inpatient Glycemic Control (2015)  Target Ranges:  Prepandial:   less than 140 mg/dL      Peak postprandial:   less than 180 mg/dL (1-2 hours)      Critically ill patients:  140 - 180 mg/dL   Lab Results  Component Value Date   GLUCAP 413 (H) 12/31/2017   HGBA1C 7.2 (H) 12/29/2017    Review of Glycemic Control  Blood sugars for past 24H - 319 - 431mg /dL. Needs insulin adjusment.  Inpatient Diabetes Program Recommendations:     Increase Lantus to 45 units bid Add Novolog 6 units tidwc for meal coverage insulin.  Will continue to follow.  Thank you. Lorenda Peck, RD, LDN, CDE Inpatient Diabetes Coordinator [phone]

## 2017-12-31 NOTE — Care Management Note (Signed)
Case Management Note  Patient Details  Name: [name] MRN: [id number] Date of Birth: [birth year]  Subjective/Objective:Acute resp failure. From home alone. Has rw,home 02-[facility] has travel tank-has custodial level asst-[employer]. PT cons-await recc. If [place] needed patient has chosen [facility] rep [name] aware & following.Patient does not want HHRN-she able to take her own meds,& follow with her pcp.                     Action/Plan:dc home w/[facility].   Expected Discharge Date:  (unknown)               Expected Discharge Plan:  [place]  In-House Referral:     Discharge planning Services  CM Consult  Post Acute Care Choice:  Durable Medical Equipment([facility] home 02-has travel tank;rw) Choice offered to:  Patient  DME Arranged:    DME Agency:     HH Arranged:    [place] Agency:     Status of Service:  In process, will continue to follow  If discussed at Long Length of Stay Meetings, dates discussed:    Additional Comments:  Dessa Phi, RN 12/31/2017, 3:05 PM

## 2017-12-31 NOTE — Progress Notes (Signed)
PROGRESS NOTE    [name]  GMW:[id number] DOB: [birth year] DOA: 12/28/2017 PCP: [name], PA-C    Brief Narrative: 52 year old with past medical history significant of COPD, asthma on 3 L of oxygen at home, HIV, CKD stage IV diabetes OHS admitted with productive cough, shortness of breath.  Patient was found to have pneumonia and probably COPD exacerbation.   Assessment & Plan:   Principal Problem:   Acute on chronic respiratory failure with hypoxia ([name]) Active Problems:   Human immunodeficiency virus (HIV) disease ([name])   Diabetes mellitus type 2 with complications, uncontrolled ([place])   Hyperlipidemia associated with type 2 diabetes mellitus ([place])   COPD with acute bronchitis ([name])   Tobacco abuse   Obesity hypoventilation syndrome ([name])   Anxiety and depression   Acute on chronic respiratory failure with hypoxia: Likely related to upper lobe pneumonia and COPD exacerbation. Continue with ceftriaxone and azithromycin. We will start weaning IV Solu-Medrol today changed to prednisone. She has not required future BiPAP. Plan to observe overnight and hopefully home tomorrow.  COPD exacerbation; Continue with antibiotics, change IV Solu-Medrol to prednisone. Tinea with nebulizer.  HIV; On Genvoya. Labs 05/21/2017 showed a CD4 770, RNA <20detected. -Continue Genvoya  Tobacco use; Counseling provided.  Lipidemia; continue with rosuvastatin.  Anxiety depression/chronic pain and neuropathy. Continue with Abilify Lyrica Klonopin.  Insulin-dependent diabetes; Increase Lantus to 45 units twice daily. Taper his steroids. Add meal coverage.  Estimated body mass index is 33.72 kg/m as calculated from the following:   Height as of this encounter: 5\' 10"  (1.778 m).   Weight as of this encounter: 106.6 kg.   DVT prophylaxis: Heparin Code Status: (DNR Family Communication: No family at bedside Disposition Plan: home  24 hours knee PT  evaluation  Consultants:   none   Procedures:   none   Antimicrobials:  Ceftriaxone and azithromycin/    Subjective: Report cough and dyspnea improved. Only uses oxygen at night. Currently on 3 l.   Objective: Vitals:   12/30/17 2238 12/31/17 0117 12/31/17 0404 12/31/17 0725  BP: 96/78  (!) 152/87   Pulse: 81  63   Resp: 18  18   Temp: 97.7 F (36.5 C)  97.9 F (36.6 C)   TempSrc: Oral  Oral   SpO2: 95% 91% 97% 98%  Weight:   106.6 kg   Height:        Intake/Output Summary (Last 24 hours) at 12/31/2017 1004 Last data filed at 12/30/2017 1935 Gross per 24 hour  Intake 360 ml  Output 2000 ml  Net -1640 ml   Filed Weights   12/29/17 0100 12/30/17 0500 12/31/17 0404  Weight: 107 kg 107.1 kg 106.6 kg    Examination:  General exam: Appears calm and comfortable  Respiratory system: Bilateral wheezing Cardiovascular system: S1 & S2 heard, RRR. No JVD, murmurs, rubs, gallops or clicks. No pedal edema. Gastrointestinal system: Abdomen is nondistended, soft and nontender. No organomegaly or masses felt. Normal bowel sounds heard. Central nervous system: Alert and oriented. No focal neurological deficits. Extremities: Symmetric 5 x 5 power. Skin: No rashes, lesions or ulcers Psychiatry: Judgement and insight appear normal. Mood & affect appropriate.     Data Reviewed: I have personally reviewed following labs and imaging studies  CBC: Recent Labs  Lab 12/28/17 2109 12/29/17 0401 12/30/17 0319  WBC 26.9* 21.3* 20.0*  NEUTROABS  --   --  18.6*  HGB 13.6 12.9 11.5*  HCT 45.4 43.5 37.1  MCV 95.8 97.1 93.5  PLT 308 294 244   Basic Metabolic Panel: Recent Labs  Lab 12/28/17 2109 12/29/17 0401 12/30/17 0319 12/31/17 0504  NA 141 134* 137 136  K 4.1 4.6 4.7 4.4  CL 102 96* 101 99  CO2 26 19* 24 26  GLUCOSE 241* 503* 354* 431*  BUN 34* 44* 53* 45*  CREATININE 1.73* 2.06* 1.57* 1.21*  CALCIUM 9.0 8.6* 8.7* 8.9  MG  --   --  2.1  --   PHOS  --   --   3.3 2.6   GFR: Estimated Creatinine Clearance: 71 mL/min (A) (by C-G formula based on SCr of 1.21 mg/dL (H)). Liver Function Tests: Recent Labs  Lab 12/28/17 2109 12/30/17 0319 12/31/17 0504  AST 34  --   --   ALT 19  --   --   ALKPHOS 60  --   --   BILITOT 0.6  --   --   PROT 6.4*  --   --   ALBUMIN 3.3* 2.9* 3.0*   No results for input(s): LIPASE, AMYLASE in the last 168 hours. No results for input(s): AMMONIA in the last 168 hours. Coagulation Profile: No results for input(s): INR, PROTIME in the last 168 hours. Cardiac Enzymes: No results for input(s): CKTOTAL, CKMB, CKMBINDEX, TROPONINI in the last 168 hours. BNP (last 3 results) No results for input(s): PROBNP in the last 8760 hours. HbA1C: Recent Labs    12/29/17 0356  HGBA1C 7.2*   CBG: Recent Labs  Lab 12/30/17 0742 12/30/17 1126 12/30/17 1634 12/30/17 2234 12/31/17 0807  GLUCAP 357* 319* 361* 329* 374*   Lipid Profile: No results for input(s): CHOL, HDL, LDLCALC, TRIG, CHOLHDL, LDLDIRECT in the last 72 hours. Thyroid Function Tests: No results for input(s): TSH, T4TOTAL, FREET4, T3FREE, THYROIDAB in the last 72 hours. Anemia Panel: No results for input(s): VITAMINB12, FOLATE, FERRITIN, TIBC, IRON, RETICCTPCT in the last 72 hours. Sepsis Labs: No results for input(s): PROCALCITON, LATICACIDVEN in the last 168 hours.  Recent Results (from the past 240 hour(s))  Culture, blood (routine x 2)     Status: None (Preliminary result)   Collection Time: 12/29/17 12:20 AM  Result Value Ref Range Status   Specimen Description   Final    BLOOD LEFT ANTECUBITAL Performed at [facility] [address] [place], [place] [address]    Special Requests   Final    BOTTLES DRAWN AEROBIC AND ANAEROBIC Blood Culture adequate volume Performed at [facility] [address] [place], [place] [address]    Culture   Final    NO GROWTH 2 DAYS Performed at [facility] [address], [place], [place] [address]    Report Status PENDING  Incomplete  Culture, blood (routine x 2)     Status: None (Preliminary result)   Collection Time: 12/29/17 12:20 AM  Result Value Ref Range Status   Specimen Description   Final    BLOOD LEFT HAND Performed at [facility] [address] [place], [place] [address]    Special Requests   Final    BOTTLES DRAWN AEROBIC ONLY Blood Culture adequate volume Performed at [facility] [address] [place], [place] [address]    Culture   Final    NO GROWTH 2 DAYS Performed at [facility] [address] [place],  [address]    Report Status PENDING  Incomplete  MRSA PCR Screening     Status: Abnormal  Collection Time: 12/29/17  4:44 AM  Result Value Ref Range Status   MRSA by PCR POSITIVE (A) NEGATIVE Final    Comment: RESULT CALLED TO, READ BACK BY AND VERIFIED WITH: G.ETLING,RN [id number] @1028  BY V.WILKINS approved for NASAL specimens only), is one component of a comprehensive MRSA colonization surveillance program. It is not intended to diagnose MRSA infection nor to guide or monitor treatment for MRSA infections. Performed at [facility], [place] [address] [place], [place] [address]          Radiology Studies: No results found.      Scheduled Meds: . ARIPiprazole  15 mg Oral Daily  . Chlorhexidine Gluconate Cloth  6 each Topical Q0600  . doxepin  10 mg Oral QHS  . elvitegravir-cobicistat-emtricitabine-tenofovir  1 tablet Oral Q breakfast  . fluticasone furoate-vilanterol  1 puff Inhalation Daily  . heparin  5,000 Units Subcutaneous Q8H  . insulin aspart  0-20 Units Subcutaneous TID WC  . insulin aspart  0-5 Units Subcutaneous QHS  . insulin glargine  40 Units Subcutaneous BID  . ipratropium-albuterol  3 mL Nebulization Q6H  . mouth rinse  15 mL Mouth Rinse BID  . methylPREDNISolone (SOLU-MEDROL) injection  40 mg Intravenous  Q12H  . mupirocin ointment  1 application Nasal BID  . pantoprazole  40 mg Oral Daily  . pregabalin  100 mg Oral BID  . rosuvastatin  20 mg Oral q1800  . senna  1 tablet Oral BID  . sodium chloride flush  3 mL Intravenous Q12H   Continuous Infusions: . azithromycin 500 mg (12/30/17 2054)  . cefTRIAXone (ROCEPHIN)  IV 1 g (12/30/17 1822)     LOS: 3 days    Time spent: 35 minutes    Elmarie Shiley, MD [facility] Pager [phone]  If 7PM-7AM, please contact night-coverage [email or web address] Password [email or web address] 12/31/2017, 10:04 AM

## 2017-12-31 NOTE — Progress Notes (Signed)
CRITICAL VALUE ALERT  Critical Value:  CBG 413  Date & Time Notied:  12/31 1150  Provider Notified: [place]  Orders Received/Actions taken: continue to monitor

## 2017-12-31 NOTE — Evaluation (Signed)
Physical Therapy Evaluation Patient Details Name: [name] MRN: [id number] DOB: Jun 03, 1965 Today's Date: 12/31/2017   History of Present Illness  52 year old with past medical history significant of COPD, asthma on 3 L of oxygen at home, HIV, CKD stage IV diabetes OHS admitted with productive cough, shortness of breath, found to have pneumonia and probably COPD exacerbation. H/O DM, HF, LBKA  Clinical Impression  Assisted patient to stand with mod assist and transfer to recliner then back to bed. Patient reports using WC Fabio Pierce mostly. Patient currently would be unsafe to ambulate without assistance.  Pt admitted with above diagnosis. Pt currently with functional limitations due to the deficits listed below (see PT Problem List). Pt will benefit from skilled PT to increase their independence and safety with mobility to allow discharge to the venue listed below.       Follow Up Recommendations Home health PT    Equipment Recommendations  None recommended by PT    Recommendations for Other Services       Precautions / Restrictions Precautions Precautions: Fall Precaution Comments: L BKA prosthetic      Mobility  Bed Mobility Overal bed mobility: Independent                Transfers Overall transfer level: Needs assistance Equipment used: Rolling walker (2 wheeled) Transfers: Sit to/from [facility] Sit to Stand: Mod assist Stand pivot transfers: Mod assist;+2 safety/equipment       General transfer comment: bed raised to rise from bed, tenuous  pivot steps to recliner. Assist to stand from recliner and transfer with steps back to bed,  Ambulation/Gait                Stairs            Wheelchair Mobility    Modified Rankin (Stroke Patients Only)       Balance                                             Pertinent Vitals/Pain Pain Assessment: No/denies pain    Home Living Family/patient expects  to be discharged to:: Private residence Living Arrangements: Alone Available Help at Discharge: Family;Available PRN/intermittently Type of Home: House Home Access: Ramped entrance     Home Layout: One level Home Equipment: Walker - 2 wheels;Cane - single point;Wheelchair - [employer];Shower seat      Prior Function Level of Independence: Independent with assistive device(s)         Comments: uses WC transfers mostly, rarely ambualtes     Hand Dominance        Extremity/Trunk Assessment   Upper Extremity Assessment Upper Extremity Assessment: Overall WFL for tasks assessed    Lower Extremity Assessment Lower Extremity Assessment: LLE deficits/detail LLE Deficits / Details: BKA    Cervical / Trunk Assessment Cervical / Trunk Assessment: Normal  Communication   Communication: No difficulties  Cognition Arousal/Alertness: Awake/alert Behavior During Therapy: WFL for tasks assessed/performed;Flat affect Overall Cognitive Status: Within Functional Limits for tasks assessed                                        General Comments      Exercises     Assessment/Plan    PT Assessment Patient needs continued PT services  PT Problem List Decreased strength;Decreased activity tolerance;Decreased mobility;Decreased knowledge of precautions;Decreased knowledge of use of DME;Decreased safety awareness       PT Treatment Interventions DME instruction;Functional mobility training;Therapeutic activities;Gait training;Patient/family education    PT Goals (Current goals can be found in the Care Plan section)  Acute Rehab PT Goals Patient Stated Goal: go home tomorrow PT Goal Formulation: With patient Time For Goal Achievement: 01/14/18 Potential to Achieve Goals: Good    Frequency Min 2X/week   Barriers to discharge        Co-evaluation               AM-PAC PT "6 Clicks" Mobility  Outcome Measure Help needed turning from your  back to your side while in a flat bed without using bedrails?: None Help needed moving from lying on your back to sitting on the side of a flat bed without using bedrails?: None Help needed moving to and from a bed to a chair (including a wheelchair)?: Total Help needed standing up from a chair using your arms (e.g., wheelchair or bedside chair)?: Total Help needed to walk in hospital room?: Total Help needed climbing 3-5 steps with a railing? : Total 6 Click Score: 12    End of Session   Activity Tolerance: Patient tolerated treatment well Patient left: in bed Nurse Communication: Mobility status PT Visit Diagnosis: Unsteadiness on feet (R26.81)    Time: 1650-1705 PT Time Calculation (min) (ACUTE ONLY): 15 min   Charges:   PT Evaluation $PT Eval Low Complexity: [place] Pager [phone] Office [phone]   Claretha Cooper 12/31/2017, 5:15 PM

## 2017-12-31 NOTE — Progress Notes (Signed)
Pt. Does not want to wear CPAP/BIPAP.  No machine in room at this time.

## 2017-12-31 NOTE — Care Management Important Message (Signed)
Important Message  Patient Details  Name: CIEARA STIERWALT MRN: [id number] Date of Birth: Apr 04, 1965   Medicare Important Message Given:  Yes    Kerin Salen 12/31/2017, 11:48 AMImportant Message  Patient Details  Name: INEZ STANTZ MRN: [id number] Date of Birth: [birth year]   Medicare Important Message Given:  Yes    Kerin Salen 12/31/2017, 11:47 AM

## 2018-01-01 LAB — CBC
HCT: 38.7 % (ref 36.0–46.0)
HEMOGLOBIN: 12.1 g/dL (ref 12.0–15.0)
MCH: 28.2 pg (ref 26.0–34.0)
MCHC: 31.3 g/dL (ref 30.0–36.0)
MCV: 90.2 fL (ref 80.0–100.0)
Platelets: 291 10*3/uL (ref 150–400)
RBC: 4.29 MIL/uL (ref 3.87–5.11)
RDW: 19.4 % — ABNORMAL HIGH (ref 11.5–15.5)
WBC: 9 10*3/uL (ref 4.0–10.5)
nRBC: 0 % (ref 0.0–0.2)

## 2018-01-01 LAB — RENAL FUNCTION PANEL
Albumin: 3 g/dL — ABNORMAL LOW (ref 3.5–5.0)
Anion gap: 11 (ref 5–15)
BUN: 42 mg/dL — ABNORMAL HIGH (ref 6–20)
CO2: 26 mmol/L (ref 22–32)
Calcium: 8.8 mg/dL — ABNORMAL LOW (ref 8.9–10.3)
Chloride: 100 mmol/L (ref 98–111)
Creatinine, Ser: 1.17 mg/dL — ABNORMAL HIGH (ref 0.44–1.00)
GFR calc Af Amer: >60 mL/min (ref >60)
GFR calc non Af Amer: 53 mL/min — ABNORMAL LOW (ref >60)
Glucose, Bld: 424 mg/dL — ABNORMAL HIGH (ref 70–99)
Phosphorus: 2.3 mg/dL — ABNORMAL LOW (ref 2.5–4.6)
Potassium: 4.5 mmol/L (ref 3.5–5.1)
Sodium: 137 mmol/L (ref 135–145)

## 2018-01-01 LAB — GLUCOSE, CAPILLARY
GLUCOSE-CAPILLARY: 361 mg/dL — AB (ref 70–99)
Glucose-Capillary: 329 mg/dL — ABNORMAL HIGH (ref 70–99)

## 2018-01-01 MED ORDER — K PHOS MONO-SOD PHOS DI & MONO 155-852-130 MG PO TABS: 500.0000 mg | ORAL_TABLET | Freq: Three times a day (TID) | ORAL | 0 refills | Status: DC

## 2018-01-01 MED ORDER — INSULIN GLARGINE 100 UNIT/ML ~~LOC~~ SOLN
55.0000 [IU] | Freq: Two times a day (BID) | SUBCUTANEOUS | Status: DC
  Administered 2018-01-01: 55 [IU] via SUBCUTANEOUS
  Filled 2018-01-01 (×2): qty 0.55

## 2018-01-01 MED ORDER — PREDNISONE 20 MG PO TABS: 40.0000 mg | ORAL_TABLET | Freq: Every day | ORAL | 0 refills | Status: DC

## 2018-01-01 MED ORDER — INSULIN ASPART 100 UNIT/ML ~~LOC~~ SOLN
6.0000 [IU] | Freq: Three times a day (TID) | SUBCUTANEOUS | Status: DC
  Administered 2018-01-01 (×2): 6 [IU] via SUBCUTANEOUS

## 2018-01-01 MED ORDER — GUAIFENESIN-DM 100-10 MG/5ML PO SYRP: 5.0000 mL | ORAL_SOLUTION | ORAL | 0 refills | Status: DC | PRN

## 2018-01-01 MED ORDER — K PHOS MONO-SOD PHOS DI & MONO 155-852-130 MG PO TABS
500.0000 mg | ORAL_TABLET | Freq: Three times a day (TID) | ORAL | Status: DC
  Administered 2018-01-01: 500 mg via ORAL
  Filled 2018-01-01 (×2): qty 2

## 2018-01-01 MED ORDER — IPRATROPIUM-ALBUTEROL 0.5-2.5 (3) MG/3ML IN SOLN: 3.0000 mL | Freq: Four times a day (QID) | RESPIRATORY_TRACT | 0 refills | Status: DC

## 2018-01-01 MED ORDER — AZITHROMYCIN 500 MG PO TABS: 500.0000 mg | ORAL_TABLET | Freq: Every day | ORAL | 0 refills | Status: AC

## 2018-01-01 MED ORDER — AMOXICILLIN-POT CLAVULANATE 875-125 MG PO TABS: 1.0000 | ORAL_TABLET | Freq: Two times a day (BID) | ORAL | 0 refills | Status: AC

## 2018-01-01 NOTE — Plan of Care (Signed)
Pt had a good appetite this shift. Pt had good urine OP this shift.

## 2018-01-01 NOTE — Plan of Care (Signed)
Discharge instructions reviewed with patient, questions answered, verbalized understanding.  Patient transported via wheelchair to ED entrance to be picked up by family member.

## 2018-01-01 NOTE — Progress Notes (Signed)
Occupational Therapy Evaluation Patient Details Name: [name] MRN: [id number] DOB: 07/08/1965 Today's Date: 01/01/2018    History of Present Illness 53 year old with past medical history significant of COPD, asthma on 3 L of oxygen at home, HIV, CKD stage IV diabetes OHS admitted with productive cough, shortness of breath, found to have pneumonia and probably COPD exacerbation. H/O DM, HF, LBKA   Clinical Impression   PTA, pt lived alone and had a PCA 4 hrs/day 7 days a week who assisted with ADL and IADL tasks as needed. Pt is overall weaker but getting close to baseline. Feel pt would benefit from [facility], however pt declines. No DME needed.     Follow Up Recommendations  No OT follow up;Supervision - Intermittent    Equipment Recommendations  None recommended by OT    Recommendations for Other Services       Precautions / Restrictions Precautions Precautions: Fall Precaution Comments: L BKA prosthetic Restrictions Weight Bearing Restrictions: No      Mobility Bed Mobility Overal bed mobility: Modified Independent                Transfers Overall transfer level: Needs assistance               General transfer comment: Pt able to lift buttocks off bed adn scoot side to side    Balance                                           ADL either performed or assessed with clinical judgement   ADL Overall ADL's : Needs assistance/impaired     Grooming: Set up   Upper Body Bathing: Set up   Lower Body Bathing: Minimal assistance;Sitting/lateral leans   Upper Body Dressing : Set up   Lower Body Dressing: Minimal assistance;Sitting/lateral leans   Toilet Transfer: Minimal assistance(lateral scoot. uses L prosthetic)   Toileting- Clothing Manipulation and Hygiene: Supervision/safety;Sitting/lateral lean         General ADL Comments: Aid assistsat balseine     Vision         Perception     Praxis      Pertinent  Vitals/Pain Pain Assessment: No/denies pain     Hand Dominance Right   Extremity/Trunk Assessment Upper Extremity Assessment Upper Extremity Assessment: Overall WFL for tasks assessed   Lower Extremity Assessment Lower Extremity Assessment: LLE deficits/detail LLE Deficits / Details: BKA   Cervical / Trunk Assessment Cervical / Trunk Assessment: Normal   Communication Communication Communication: No difficulties   Cognition Arousal/Alertness: Awake/alert Behavior During Therapy: Flat affect Overall Cognitive Status: Within Functional Limits for tasks assessed                                     General Comments       Exercises     Shoulder Instructions      Home Living Family/patient expects to be discharged to:: Private residence Living Arrangements: Alone Available Help at Discharge: Family;Available PRN/intermittently Type of Home: House Home Access: Ramped entrance     Home Layout: One level     Bathroom Shower/Tub: Occupational psychologist: Standard Bathroom Accessibility: Yes How Accessible: Accessible via wheelchair Home Equipment: [place] - 2 wheels;Cane - single point;Wheelchair - [employer];Shower seat  Prior Functioning/Environment Level of Independence: Independent with assistive device(s)        Comments: uses WC transfers mostly, rarely ambualtes; has aid 4 hrs/day 7 days/wk        OT Problem List: Decreased strength;Decreased activity tolerance;Obesity;Cardiopulmonary status limiting activity      OT Treatment/Interventions:      OT Goals(Current goals can be found in the care plan section) Acute Rehab OT Goals Patient Stated Goal: to go home OT Goal Formulation: All assessment and education complete, DC therapy  OT Frequency:     Barriers to D/C:            Co-evaluation              AM-PAC OT "6 Clicks" Daily Activity     Outcome Measure Help from another person eating  meals?: None Help from another person taking care of personal grooming?: None Help from another person toileting, which includes using toliet, bedpan, or urinal?: A Little Help from another person bathing (including washing, rinsing, drying)?: A Little Help from another person to put on and taking off regular upper body clothing?: None Help from another person to put on and taking off regular lower body clothing?: A Little 6 Click Score: 21   End of Session Nurse Communication: Mobility status  Activity Tolerance: Patient tolerated treatment well Patient left: in bed;with call bell/phone within reach  OT Visit Diagnosis: Muscle weakness (generalized) (M62.81)                Time: 7001-7494 OT Time Calculation (min): 14 min Charges:  OT General Charges $OT Visit: 1 Visit OT Evaluation $OT Eval Low Complexity: E. Lopez, OT/L   Acute OT Clinical Specialist Acute Rehabilitation Services Pager [phone] Office [phone]   [facility] 01/01/2018, 12:52 PM

## 2018-01-01 NOTE — Care Management Note (Signed)
Case Management Note  Patient Details  Name: [name] MRN: [id number] Date of Birth: 05-14-1965  Subjective/Objective: [facility] rep [name] aware of d/c today per attending. Await face to face order. No further CM needs.                   Action/Plan:dc home w/HHC.   Expected Discharge Date:  (unknown)               Expected Discharge Plan:  [place]  In-House Referral:     Discharge planning Services  CM Consult  Post Acute Care Choice:  Durable Medical Equipment([facility] home 02-has travel tank;rw) Choice offered to:  Patient  DME Arranged:    DME Agency:     HH Arranged:  PT [place]:  [place]  Status of Service:  Completed, signed off  If discussed at [place] of Stay Meetings, dates discussed:    Additional Comments:  Dessa Phi, RN 01/01/2018, 9:47 AM

## 2018-01-02 ENCOUNTER — Other Ambulatory Visit: Payer: Self-pay

## 2018-01-02 ENCOUNTER — Emergency Department (HOSPITAL_COMMUNITY)
Admission: EM | Admit: 2018-01-02 | Discharge: 2018-01-03 | Disposition: A | Discharge status: Home / Self Care | Payer: Medicare HMO | Attending: Emergency Medicine | Admitting: Emergency Medicine

## 2018-01-02 ENCOUNTER — Emergency Department (HOSPITAL_COMMUNITY): Payer: Medicare HMO

## 2018-01-02 ENCOUNTER — Encounter (HOSPITAL_COMMUNITY): Payer: Self-pay

## 2018-01-02 DIAGNOSIS — R531 Weakness: Secondary | ICD-10-CM | POA: Diagnosis not present

## 2018-01-02 DIAGNOSIS — E1165 Type 2 diabetes mellitus with hyperglycemia: Secondary | ICD-10-CM | POA: Diagnosis not present

## 2018-01-02 DIAGNOSIS — I130 Hypertensive heart and chronic kidney disease with heart failure and stage 1 through stage 4 chronic kidney disease, or unspecified chronic kidney disease: Secondary | ICD-10-CM | POA: Diagnosis not present

## 2018-01-02 DIAGNOSIS — E1122 Type 2 diabetes mellitus with diabetic chronic kidney disease: Secondary | ICD-10-CM | POA: Diagnosis not present

## 2018-01-02 DIAGNOSIS — B20 Human immunodeficiency virus [HIV] disease: Secondary | ICD-10-CM | POA: Diagnosis not present

## 2018-01-02 DIAGNOSIS — F17210 Nicotine dependence, cigarettes, uncomplicated: Secondary | ICD-10-CM | POA: Diagnosis not present

## 2018-01-02 DIAGNOSIS — Z79899 Other long term (current) drug therapy: Secondary | ICD-10-CM | POA: Insufficient documentation

## 2018-01-02 DIAGNOSIS — J45909 Unspecified asthma, uncomplicated: Secondary | ICD-10-CM | POA: Insufficient documentation

## 2018-01-02 DIAGNOSIS — J189 Pneumonia, unspecified organism: Secondary | ICD-10-CM | POA: Diagnosis not present

## 2018-01-02 DIAGNOSIS — R0602 Shortness of breath: Secondary | ICD-10-CM | POA: Diagnosis not present

## 2018-01-02 DIAGNOSIS — S2241XA Multiple fractures of ribs, right side, initial encounter for closed fracture: Secondary | ICD-10-CM | POA: Diagnosis not present

## 2018-01-02 DIAGNOSIS — J449 Chronic obstructive pulmonary disease, unspecified: Secondary | ICD-10-CM | POA: Diagnosis not present

## 2018-01-02 DIAGNOSIS — I10 Essential (primary) hypertension: Secondary | ICD-10-CM | POA: Diagnosis not present

## 2018-01-02 DIAGNOSIS — Z89512 Acquired absence of left leg below knee: Secondary | ICD-10-CM | POA: Insufficient documentation

## 2018-01-02 DIAGNOSIS — N183 Chronic kidney disease, stage 3 (moderate): Secondary | ICD-10-CM | POA: Diagnosis not present

## 2018-01-02 DIAGNOSIS — Z794 Long term (current) use of insulin: Secondary | ICD-10-CM | POA: Diagnosis not present

## 2018-01-02 DIAGNOSIS — I5032 Chronic diastolic (congestive) heart failure: Secondary | ICD-10-CM | POA: Insufficient documentation

## 2018-01-02 DIAGNOSIS — R0902 Hypoxemia: Secondary | ICD-10-CM | POA: Diagnosis not present

## 2018-01-02 LAB — CBC WITH DIFFERENTIAL/PLATELET
Abs Immature Granulocytes: 0.19 10*3/uL — ABNORMAL HIGH (ref 0.00–0.07)
Basophils Absolute: 0 10*3/uL (ref 0.0–0.1)
Basophils Relative: 0 %
Eosinophils Absolute: 0 10*3/uL (ref 0.0–0.5)
Eosinophils Relative: 0 %
HCT: 45.6 % (ref 36.0–46.0)
Hemoglobin: 14.1 g/dL (ref 12.0–15.0)
Immature Granulocytes: 2 %
Lymphocytes Relative: 8 %
Lymphs Abs: 0.8 10*3/uL (ref 0.7–4.0)
MCH: 28.4 pg (ref 26.0–34.0)
MCHC: 30.9 g/dL (ref 30.0–36.0)
MCV: 91.8 fL (ref 80.0–100.0)
Monocytes Absolute: 0.5 10*3/uL (ref 0.1–1.0)
Monocytes Relative: 5 %
Neutro Abs: 9.2 10*3/uL — ABNORMAL HIGH (ref 1.7–7.7)
Neutrophils Relative %: 85 %
Platelets: 281 10*3/uL (ref 150–400)
RBC: 4.97 MIL/uL (ref 3.87–5.11)
RDW: 18.9 % — ABNORMAL HIGH (ref 11.5–15.5)
WBC: 10.8 10*3/uL — ABNORMAL HIGH (ref 4.0–10.5)
nRBC: 0 % (ref 0.0–0.2)

## 2018-01-02 LAB — URINALYSIS, ROUTINE W REFLEX MICROSCOPIC
Bilirubin Urine: NEGATIVE
Glucose, UA: >=500 mg/dL — AB
Ketones, ur: NEGATIVE mg/dL
Leukocytes, UA: NEGATIVE
Nitrite: NEGATIVE
Protein, ur: 30 mg/dL — AB
SPECIFIC GRAVITY, URINE: 1.032 — AB (ref 1.005–1.030)
pH: 6 (ref 5.0–8.0)

## 2018-01-02 LAB — COMPREHENSIVE METABOLIC PANEL
ALT: 28 U/L (ref 0–44)
AST: 19 U/L (ref 15–41)
Albumin: 3.5 g/dL (ref 3.5–5.0)
Alkaline Phosphatase: 59 U/L (ref 38–126)
Anion gap: 10 (ref 5–15)
BUN: 34 mg/dL — ABNORMAL HIGH (ref 6–20)
CHLORIDE: 99 mmol/L (ref 98–111)
CO2: 32 mmol/L (ref 22–32)
CREATININE: 1.14 mg/dL — AB (ref 0.44–1.00)
Calcium: 9.3 mg/dL (ref 8.9–10.3)
GFR calc Af Amer: >60 mL/min (ref >60)
GFR calc non Af Amer: 55 mL/min — ABNORMAL LOW (ref >60)
Glucose, Bld: 355 mg/dL — ABNORMAL HIGH (ref 70–99)
POTASSIUM: 4.8 mmol/L (ref 3.5–5.1)
Sodium: 141 mmol/L (ref 135–145)
Total Bilirubin: 0.4 mg/dL (ref 0.3–1.2)
Total Protein: 6.9 g/dL (ref 6.5–8.1)

## 2018-01-02 LAB — BRAIN NATRIURETIC PEPTIDE: B Natriuretic Peptide: 42.7 pg/mL (ref 0.0–100.0)

## 2018-01-02 LAB — I-STAT TROPONIN, ED: TROPONIN I, POC: 0 ng/mL (ref 0.00–0.08)

## 2018-01-02 LAB — MAGNESIUM: Magnesium: 2.1 mg/dL (ref 1.7–2.4)

## 2018-01-02 MED ORDER — ALBUTEROL SULFATE (2.5 MG/3ML) 0.083% IN NEBU
5.0000 mg | INHALATION_SOLUTION | Freq: Once | RESPIRATORY_TRACT | Status: AC
  Administered 2018-01-02: 5 mg via RESPIRATORY_TRACT
  Filled 2018-01-02: qty 6

## 2018-01-02 MED ORDER — IOPAMIDOL (ISOVUE-370) INJECTION 76%
INTRAVENOUS | Status: AC
  Filled 2018-01-02: qty 100

## 2018-01-02 MED ORDER — IOPAMIDOL (ISOVUE-370) INJECTION 76%
100.0000 mL | Freq: Once | INTRAVENOUS | Status: AC | PRN
  Administered 2018-01-02: 100 mL via INTRAVENOUS

## 2018-01-02 MED ORDER — IPRATROPIUM BROMIDE 0.02 % IN SOLN
0.5000 mg | Freq: Once | RESPIRATORY_TRACT | Status: AC
  Administered 2018-01-02: 0.5 mg via RESPIRATORY_TRACT
  Filled 2018-01-02: qty 2.5

## 2018-01-02 MED ORDER — SODIUM CHLORIDE (PF) 0.9 % IJ SOLN
INTRAMUSCULAR | Status: AC
  Filled 2018-01-02: qty 100

## 2018-01-02 MED ORDER — SODIUM CHLORIDE (PF) 0.9 % IJ SOLN
INTRAMUSCULAR | Status: AC
  Filled 2018-01-02: qty 50

## 2018-01-02 NOTE — ED Notes (Signed)
CT Staff called charge and stated that the patient's IV had blown when they injected the contrast.

## 2018-01-02 NOTE — Discharge Summary (Signed)
Physician Discharge Summary  ZERINA HALLINAN FTD:[id number] DOB: 1965-02-16 DOA: 12/28/2017  PCP: [name], PA-C  Admit date: 12/28/2017 Discharge date: 01/02/2018  Admitted From: home  Disposition:  home  Recommendations for Outpatient Follow-up:  1. Follow up with PCP in 1-2 weeks 2. Please obtain BMP/CBC in one week 3.   Home Health: yes.   Discharge Condition: stable.  CODE STATUS:DNR Diet recommendation: Heart Healthy  Brief/Interim Summary: 53 year old with past medical history significant of COPD, asthma on 3 L of oxygen at home, HIV, CKD stage IV diabetes [facility] admitted with productive cough, shortness of breath.  Patient was found to have pneumonia and probably COPD exacerbation.  Acute on chronic respiratory failure with hypoxia: Likely related to upper lobe pneumonia and COPD exacerbation. Continue with ceftriaxone and azithromycin. We will start weaning IV Solu-Medrol today changed to prednisone. She has not required future BiPAP. She is breathing better. On 2 L oxygen . She has not required BIAP.  Plan to discharge home on oral antibiotics and prednisone.    COPD exacerbation; Continue with antibiotics, change IV Solu-Medrol to prednisone. Tinea with nebulizer.  HIV; On Genvoya. Labs 05/21/2017 showed a CD4 770, RNA <20detected. -Continue Genvoya  Tobacco use; Counseling provided.  Lipidemia; continue with rosuvastatin.  Anxiety depression/chronic pain and neuropathy. Continue with Abilify Lyrica Klonopin.  Insulin-dependent diabetes; Increase Lantus to  60 units twice daily. Home dose Taper his steroids. Add meal coverage.  Morbid obesity  diet education.   Estimated body mass index is 33.72 kg/m as calculated from the following:   Height as of this encounter: 5\' 10"  (1.778 m).   Weight as of this encounter: 106.6 kg.    Discharge Diagnoses:  Principal Problem:   Acute on chronic respiratory failure with hypoxia  ([name]) Active Problems:   Human immunodeficiency virus (HIV) disease ([name])   Diabetes mellitus type 2 with complications, uncontrolled ([place])   Hyperlipidemia associated with type 2 diabetes mellitus ([place])   COPD with acute bronchitis ([name])   Tobacco abuse   Obesity hypoventilation syndrome ([place])   Anxiety and depression    Discharge Instructions  Discharge Instructions    Diet - low sodium heart healthy   Complete by:  As directed    Increase activity slowly   Complete by:  As directed      Allergies as of 01/01/2018      Reactions   Cortizone-10 [hydrocortisone] Rash   Per patient, given local injection at knee and developed rash at local site.       Medication List    STOP taking these medications   bictegravir-emtricitabine-tenofovir AF 50-200-25 MG Tabs tablet Commonly known as:  BIKTARVY     TAKE these medications   albuterol 108 (90 Base) MCG/ACT inhaler Commonly known as:  PROVENTIL HFA;VENTOLIN HFA INHALE 2 PUFFS INTO THE LUNGS EVERY 6 HOURS AS NEEDED FOR WHEEZING OR SHORTNESS OF BREATH   amoxicillin-clavulanate 875-125 MG tablet Commonly known as:  AUGMENTIN Take 1 tablet by mouth 2 (two) times daily for 3 days.   ARIPiprazole 15 MG tablet Commonly known as:  ABILIFY Take 1 tablet (15 mg total) by mouth daily.   azithromycin 500 MG tablet Commonly known as:  ZITHROMAX Take 1 tablet (500 mg total) by mouth daily for 3 days. Take 1 tablet daily for 3 days.   clonazePAM 0.5 MG tablet Commonly known as:  KLONOPIN Take 0.5 tablets (0.25 mg total) by mouth 2 (two) times daily as needed for anxiety.   diphenoxylate-atropine 2.5-0.025  MG tablet Commonly known as:  LOMOTIL Take 1 tablet by mouth 4 (four) times daily as needed for diarrhea or loose stools.   doxepin 10 MG capsule Commonly known as:  SINEQUAN Take 1 capsule (10 mg total) by mouth at bedtime.   fenofibrate 145 MG tablet Commonly known as:  TRICOR Take 1 tablet (145 mg total) by mouth  daily.   fluticasone furoate-vilanterol 100-25 MCG/INH Aepb Commonly known as:  BREO ELLIPTA INHALE 1 PUFF INTO THE LUNGS DAILY   GENVOYA 150-150-200-10 MG Tabs tablet Generic drug:  elvitegravir-cobicistat-emtricitabine-tenofovir Take 1 tablet by mouth daily with breakfast.   glucose blood test strip Commonly known as:  ACCU-CHEK AVIVA Use as instructed to check blood sugar 2 times daily.   guaiFENesin-dextromethorphan 100-10 MG/5ML syrup Commonly known as:  ROBITUSSIN DM Take 5 mLs by mouth every 4 (four) hours as needed for cough.   insulin aspart 100 UNIT/ML injection Commonly known as:  novoLOG Inject 12 Units into the skin 3 (three) times daily with meals. What changed:  Another medication with the same name was removed. Continue taking this medication, and follow the directions you see here.   ipratropium-albuterol 0.5-2.5 (3) MG/3ML Soln Commonly known as:  DUONEB Take 3 mLs by nebulization every 6 (six) hours.   MAGNESIUM-OXIDE 400 (241.3 Mg) MG tablet Generic drug:  magnesium oxide Take 400 mg by mouth daily.   multivitamin with minerals Tabs tablet Take 1 tablet by mouth daily.   nicotine 21 mg/24hr patch Commonly known as:  NICODERM CQ - dosed in mg/24 hours Place 1 patch (21 mg total) onto the skin daily.   pantoprazole 40 MG tablet Commonly known as:  PROTONIX Take 1 tablet (40 mg total) by mouth daily.   phosphorus 155-852-130 MG tablet Commonly known as:  K PHOS NEUTRAL Take 2 tablets (500 mg total) by mouth 3 (three) times daily.   predniSONE 20 MG tablet Commonly known as:  DELTASONE Take 2 tablets (40 mg total) by mouth daily with breakfast.   pregabalin 100 MG capsule Commonly known as:  LYRICA Take 1 capsule (100 mg total) by mouth 2 (two) times daily.   rosuvastatin 20 MG tablet Commonly known as:  CRESTOR Take 1 tablet (20 mg total) by mouth daily. Reported on 05/02/2015   temazepam 7.5 MG capsule Commonly known as:  RESTORIL Take 1  capsule (7.5 mg total) by mouth at bedtime as needed for sleep.   TOUJEO MAX SOLOSTAR 300 UNIT/ML Sopn Generic drug:  Insulin Glargine (2 Unit Dial) Inject 70 Units into the skin 2 (two) times daily. What changed:  Another medication with the same name was removed. Continue taking this medication, and follow the directions you see here.      Follow-up Information    [facility] Follow up.   Specialty:  Home Health Services Why:  [facility] physical therapy Contact information: [place] [address] [phone]          Allergies  Allergen Reactions  . Cortizone-10 [Hydrocortisone] Rash    Per patient, given local injection at knee and developed rash at local site.     Consultations:  none   Procedures/Studies: Dg Chest 2 View  Result Date: 12/28/2017 CLINICAL DATA:  Shortness of breath for 2 weeks. EXAM: CHEST - 2 VIEW COMPARISON:  September 01, 2017 FINDINGS: The heart size and mediastinal contours are within normal limits. Patchy consolidation of the right upper lobe is identified. The left lung is clear. There is  no pleural effusion. The visualized skeletal structures are unremarkable. IMPRESSION: Right upper lobe pneumonia. Follow-up after treatment is recommended to ensure resolution. Electronically Signed   By: Abelardo Diesel M.D.   On: 12/28/2017 21:52      Subjective: Feeling better   Discharge Exam: Vitals:   01/01/18 0628 01/01/18 0720  BP: 126/90   Pulse: 62   Resp: 12   Temp: 98 F (36.7 C)   SpO2: 98% 98%   Vitals:   12/31/17 2125 01/01/18 0152 01/01/18 0628 01/01/18 0720  BP: (!) 137/91  126/90   Pulse: 66  62   Resp: 14  12   Temp: 97.8 F (36.6 C)  98 F (36.7 C)   TempSrc: Oral  Oral   SpO2: 98% 97% 98% 98%  Weight:   105.3 kg   Height:        General: Pt is alert, awake, not in acute distress Cardiovascular: RRR, S1/S2 +, no rubs, no gallops Respiratory: CTA bilaterally, no wheezing, no  rhonchi Abdominal: Soft, NT, ND, bowel sounds + Extremities: Left LE BKA     The results of significant diagnostics from this hospitalization (including imaging, microbiology, ancillary and laboratory) are listed below for reference.     Microbiology: Recent Results (from the past 240 hour(s))  Culture, blood (routine x 2)     Status: None (Preliminary result)   Collection Time: 12/29/17 12:20 AM  Result Value Ref Range Status   Specimen Description   Final    BLOOD LEFT ANTECUBITAL Performed at [facility] [address], [place], [place] [address]    Special Requests   Final    BOTTLES DRAWN AEROBIC AND ANAEROBIC Blood Culture adequate volume Performed at [facility] [address] [place], [place] [address]    Culture   Final    NO GROWTH 4 DAYS Performed at [facility], [place] [address] [place], [place] [address]    Report Status PENDING  Incomplete  Culture, blood (routine x 2)     Status: None (Preliminary result)   Collection Time: 12/29/17 12:20 AM  Result Value Ref Range Status   Specimen Description   Final    BLOOD LEFT HAND Performed at [facility] [address] [place], [place] [address]    Special Requests   Final    BOTTLES DRAWN AEROBIC ONLY Blood Culture adequate volume Performed at [facility] [address], [place], [place] [address]    Culture   Final    NO GROWTH 4 DAYS Performed at [facility], [place] [address] [place], [place] [address]    Report Status PENDING  Incomplete  MRSA PCR Screening     Status: Abnormal   Collection Time: 12/29/17  4:44 AM  Result Value Ref Range Status   MRSA by PCR POSITIVE (A) NEGATIVE Final    Comment: RESULT CALLED TO, READ BACK BY AND VERIFIED WITH: G.ETLING,RN [id number] @1028  BY V.WILKINS approved for NASAL specimens only), is one component of a comprehensive MRSA colonization surveillance program. It is  not intended to diagnose MRSA infection nor to guide or monitor treatment for MRSA infections. Performed at [facility], [place] [address], [place],  [address]      Labs: BNP (last 3 results) Recent Labs    02/23/17 1736 08/26/17 1008 08/29/17 1222  BNP 722.2* 157.0* 37.0   Basic Metabolic Panel: Recent Labs  Lab 12/28/17 2109 12/29/17 0401 12/30/17 0319  12/31/17 0504 01/01/18 0439  NA 141 134* 137 136 137  K 4.1 4.6 4.7 4.4 4.5  CL 102 96* 101 99 100  CO2 26 19* 24 26 26   GLUCOSE 241* 503* 354* 431* 424*  BUN 34* 44* 53* 45* 42*  CREATININE 1.73* 2.06* 1.57* 1.21* 1.17*  CALCIUM 9.0 8.6* 8.7* 8.9 8.8*  MG  --   --  2.1  --   --   PHOS  --   --  3.3 2.6 2.3*   Liver Function Tests: Recent Labs  Lab 12/28/17 2109 12/30/17 0319 12/31/17 0504 01/01/18 0439  AST 34  --   --   --   ALT 19  --   --   --   ALKPHOS 60  --   --   --   BILITOT 0.6  --   --   --   PROT 6.4*  --   --   --   ALBUMIN 3.3* 2.9* 3.0* 3.0*   No results for input(s): LIPASE, AMYLASE in the last 168 hours. No results for input(s): AMMONIA in the last 168 hours. CBC: Recent Labs  Lab 12/28/17 2109 12/29/17 0401 12/30/17 0319 01/01/18 0439  WBC 26.9* 21.3* 20.0* 9.0  NEUTROABS  --   --  18.6*  --   HGB 13.6 12.9 11.5* 12.1  HCT 45.4 43.5 37.1 38.7  MCV 95.8 97.1 93.5 90.2  PLT 308 294 288 291   Cardiac Enzymes: No results for input(s): CKTOTAL, CKMB, CKMBINDEX, TROPONINI in the last 168 hours. BNP: Invalid input(s): POCBNP CBG: Recent Labs  Lab 12/31/17 1136 12/31/17 1649 12/31/17 2123 01/01/18 0729 01/01/18 1139  GLUCAP 413* 224* 332* 361* 329*   D-Dimer No results for input(s): DDIMER in the last 72 hours. Hgb A1c No results for input(s): HGBA1C in the last 72 hours. Lipid Profile No results for input(s): CHOL, HDL, LDLCALC, TRIG, CHOLHDL, LDLDIRECT in the last 72 hours. Thyroid function studies No results for input(s): TSH, T4TOTAL, T3FREE,  THYROIDAB in the last 72 hours.  Invalid input(s): FREET3 Anemia work up No results for input(s): VITAMINB12, FOLATE, FERRITIN, TIBC, IRON, RETICCTPCT in the last 72 hours. Urinalysis    Component Value Date/Time   COLORURINE YELLOW 09/02/2017 1648   APPEARANCEUR CLEAR 09/02/2017 1648   LABSPEC 1.010 09/02/2017 1648   PHURINE 5.0 09/02/2017 1648   GLUCOSEU NEGATIVE 09/02/2017 1648   GLUCOSEU 250 (A) 03/16/2015 1612   HGBUR SMALL (A) 09/02/2017 1648   BILIRUBINUR NEGATIVE 09/02/2017 1648   KETONESUR NEGATIVE 09/02/2017 1648   PROTEINUR NEGATIVE 09/02/2017 1648   UROBILINOGEN 0.2 03/16/2015 1612   NITRITE NEGATIVE 09/02/2017 1648   LEUKOCYTESUR TRACE (A) 09/02/2017 1648   Sepsis Labs Invalid input(s): PROCALCITONIN,  WBC,  LACTICIDVEN Microbiology Recent Results (from the past 240 hour(s))  Culture, blood (routine x 2)     Status: None (Preliminary result)   Collection Time: 12/29/17 12:20 AM  Result Value Ref Range Status   Specimen Description   Final    BLOOD LEFT ANTECUBITAL Performed at [facility] [address], [place], [place] [address]    Special Requests   Final    BOTTLES DRAWN AEROBIC AND ANAEROBIC Blood Culture adequate volume Performed at [facility] [address], [place], [place] [address]    Culture   Final    NO GROWTH 4 DAYS Performed at [facility], [place] [address] [place], [place] [address]    Report Status PENDING  Incomplete  Culture, blood (routine x 2)     Status: None (Preliminary result)   Collection Time: 12/29/17 12:20 AM  Result Value Ref Range Status   Specimen Description   Final    BLOOD LEFT HAND Performed at [facility] [address] [place], [place] [address]    Special Requests   Final    BOTTLES DRAWN AEROBIC ONLY Blood Culture adequate volume Performed at [facility] [address], [place], [place] [address]    Culture   Final    NO  GROWTH 4 DAYS Performed at [facility], [place] [address], [place], [place] [address]    Report Status PENDING  Incomplete  MRSA PCR Screening     Status: Abnormal   Collection Time: 12/29/17  4:44 AM  Result Value Ref Range Status   MRSA by PCR POSITIVE (A) NEGATIVE Final    Comment: RESULT CALLED TO, READ BACK BY AND VERIFIED WITH: G.ETLING,RN [id number] @1028  BY V.WILKINS approved for NASAL specimens only), is one component of a comprehensive MRSA colonization surveillance program. It is not intended to diagnose MRSA infection nor to guide or monitor treatment for MRSA infections. Performed at [facility], [place] [address] [place], [place] [address]      Time coordinating discharge: 35 minutes  SIGNED:   Elmarie Shiley, MD  [facility] 01/02/2018, 5:05 PM Pager   If 7PM-7AM, please contact night-coverage [email or web address] Password [email or web address]

## 2018-01-02 NOTE — ED Notes (Signed)
IV team at bedside 

## 2018-01-02 NOTE — ED Notes (Signed)
Attempted an IV x 2, but unsuccessful.

## 2018-01-02 NOTE — ED Notes (Signed)
Writer attempted x 2 to draw blood with no success.

## 2018-01-02 NOTE — Progress Notes (Signed)
Consult request has been received. CSW attempting to follow up at present time.  CSW reviewed chart and saw the RN CM met pt's needs ad confirmed this with the EDP.  Please reconsult if future social work needs arise.  CSW signing off, as social work intervention is no longer needed.  [name]. Lucine Bilski, LCSW, LCAS, CSI Clinical Social Worker Ph: [phone]

## 2018-01-02 NOTE — ED Provider Notes (Signed)
Care assumed at shift change from [facility].  See her full note for HPI and work-up.  Briefly, patient was just recently discharged  yesterday from hospital admission for PNA with acute hypoxia.  Today she felt generally weak and her leg gave out while transferring.  Plan to obtain CTA chest to r/o PE.  Case management consulted, patient stating she needs more help at home.  Home health will be alerted to further assess patient and evaluate her needs.  Plan to follow-up on CT results.  If negative, patient is safe for discharge if she can transfer per her baseline. Physical Exam  BP (!) 145/104   Pulse 86   Temp 98 F (36.7 C) (Oral)   Resp 18   Ht 5\' 10"  (1.778 m)   Wt 107 kg   LMP 11/16/2010   SpO2 90%   BMI 33.86 kg/m   Physical Exam Vitals signs and nursing note reviewed.  Constitutional:      Appearance: She is well-developed.  HENT:     Head: Normocephalic and atraumatic.  Eyes:     Conjunctiva/sclera: Conjunctivae normal.  Cardiovascular:     Rate and Rhythm: Normal rate.  Pulmonary:     Effort: Pulmonary effort is normal. No tachypnea, accessory muscle usage or respiratory distress.     Comments: O2 saturation 98% on room air. Neurological:     Mental Status: She is alert.  Psychiatric:        Mood and Affect: Mood normal.        Behavior: Behavior normal.     ED Course/Procedures   Clinical Course as of Jan 03 2248  Thu Jan 02, 2018  1918 Per [facility] radiology, chest x-ray is reading as negative with "no active cardiopulmonary disease."  Unsure why image result was not crossing over to medical record.   [JR]  2120 Per [facility] pacs system, CTA is neg for PE. Chronic rib fractures present, questionable subacute  fractures of right 5-9 ribs.   [JR]  2156 Pt questioned about fall today regarding CT findings, states she has old rib fractures, has no chest wall pain today. Discussed reassuring results and plan for discharge.   [JR]    Clinical Course User  Index [JR] Dayona Shaheen, [place] N, PA-C    Procedures  MDM  Chest x-ray per [facility] radiology is negative.  CTA chest per [facility] PACS system is negative.  There is some type of technical issue preventing images from crossing over, however I personally read through the image results.  Patient transferred from bed to bedside commode without issue.  States she is feeling better and is ready for discharge.  Discussed results, findings, treatment and follow up. Patient advised of return precautions. Patient verbalized understanding and agreed with plan.       Ludivina Guymon, [place] N, PA-C 01/02/18 [place], Nathan, MD 01/03/18 [phone]

## 2018-01-02 NOTE — Care Management Note (Signed)
Case Management Note  Pt returned to the ED today after hospital transition home yesterday.  [name], [place] advised that pt received approx 3 hours a day PCS but is requesting more help at home and was referred  [facility] PT.  CM advised that pt's [facility] team can increase services, [name] RN PT OT NA SW, to assist pt further at home and help evaluate her needs.  CM contacted [name] with [facility] who will alert the [facility] team.  Updated [name], [place].  No further CM needs noted at this time.  [name], [name], RN 01/02/2018, 4:06 PM

## 2018-01-02 NOTE — ED Triage Notes (Signed)
Per EMS- Patient is from home. Patient c/o SOB, fall, and weakness. Patient went to the bathroom and fell. Home health aide was unable to get the patient off of the floor. Patient did not hit her head nor is she on blood thinners. Patient has a history of COPD and receives Albuterol neb treatment every 6 hours, but had not had a treatment since 2200 last night.

## 2018-01-02 NOTE — ED Provider Notes (Signed)
[facility] DEPT Provider Note   CSN: [id number] Arrival date & time: 01/02/18  1305     History   Chief Complaint Chief Complaint  Patient presents with  . Shortness of Breath  . Weakness  . Fall    HPI [name] is a 53 y.o. female.  HPI  Patient is a 53 year old female with a history of HIV on Genvoya, type 2 diabetes mellitus, COPD, and STEMI, status post left BKA presenting for generalized weakness and fatigue.  Patient had a fall today also in the bathroom where she was lowered to the ground by her home health aide.  Patient was discharged from the hospital after a diagnosis of pneumonia and acute hypoxic respiratory failure yesterday.  Patient reports that she did not feel particular strong when she left the hospital, typically gets around the house in a wheelchair but is able to perform transfers from wheelchair.  Patient reports that today, she was assisted going to the bathroom by her home health aide, however had a fall.  She was lowered to the ground.  She denies any syncope, chest pain, or shortness of breath.  Denies head trauma.  Patient does not take blood thinning medications.  She reports improvement in her cough.  Reports compliance with azithromycin Augmentin prescribed by the hospital.  Denies any chest pain, exertional shortness of breath, abdominal pain, nausea vomiting, diarrhea, dysuria, urgency, or frequency.  Patient has any swelling in her right leg.  Past Medical History:  Diagnosis Date  . Acute metabolic encephalopathy 2/83/6629  . Acute on chronic respiratory failure with hypoxia ([place]) 05/19/2016  . Anxiety   . ARF (acute renal failure) ([place]) 05/03/2014  . Arthritis    LEFT HIP  . Asthma    HOSPITALIZED WITH EXCERBATION OF ASTHMA - AND BRONCHITIS AND THE FLU DEC 2013  . Asthma exacerbation attacks 12/28/2011  . Bronchitis 12/28/2011  . CAP (community acquired pneumonia) 07/25/2012  . Chronic kidney disease (CKD),  stage IV (severe) ([place]) 11/10/2014  . Class 3 obesity due to excess calories with serious comorbidity and body mass index (BMI) of 40.0 to 44.9 in adult   . Colles' fracture of left radius   . COPD (chronic obstructive pulmonary disease) ([place])   . Depression   . Diabetes mellitus    ON INSULIN AND ORAL MEDICATIONS  . Diabetes mellitus type 2 with complications, uncontrolled ([place]) 04/19/2008   Qualifier: Diagnosis of  By: Tomma Lightning MD, Claiborne Billings     . Diabetic foot infection ([place]) 11/09/2014  . Diabetic ketoacidosis without coma associated with type 2 diabetes mellitus ([place])   . Diabetic neuropathy ([place]) 11/10/2014  . Diarrhea 08/30/2008   Qualifier: Diagnosis of  By: Tomma Lightning MD, Claiborne Billings    . Diastolic dysfunction 47/06/5463  . Difficult intravenous access   . DKA, type 2 ([place]) 04/04/2016  . Dyslipidemia 12/23/2008   Qualifier: Diagnosis of  By: Tomma Lightning MD, Claiborne Billings    . Essential hypertension 04/19/2008   Qualifier: Diagnosis of  By: Tomma Lightning MD, Claiborne Billings    . FATIGUE 07/12/2008   Qualifier: Diagnosis of  By: Tomma Lightning MD, Claiborne Billings    . Femoral neck fracture ([place]) 07/16/2011  . Femur fracture, left ([place]) 07/08/2014  . Fever   . Fracture of distal femur ([place]) 07/08/2014  . GERD (gastroesophageal reflux disease)   . Hereditary and idiopathic peripheral neuropathy 04/19/2008   Qualifier: Diagnosis of  By: Tomma Lightning MD, Claiborne Billings    . Hip fracture, left ([place]) 07/16/2011  . Hip  pain   . HIV infection ([place]) 2000  . Human immunodeficiency virus (HIV) disease ([place]) 04/19/2008   [id number] +   . Hyperlipidemia   . Hypertension   . Hyponatremia 12/29/2011  . Influenza A 12/29/2011  . Influenza B   . Insomnia 06/12/2016  . Left hip pain 05/03/2014  . Mood disorder ([place]) 04/19/2008   Qualifier: Diagnosis of  By: Tomma Lightning MD, Claiborne Billings    . Neuropathy    NEUROPATHY HANDS AND FEET  . NSTEMI (non-ST elevated myocardial infarction) ([place]) 05/19/2016  . Obesity hypoventilation syndrome ([place])   . Osteoporosis 07/08/2014  . Pain    SEVERE  PAIN LEFT HIP - HX OF LEFT HIP PINNING JULY 2013  . Perimenopausal symptoms 02/13/2012   LMP around 2011. On estrace and provera since around 2012 for hot flashes, mood swings. Estrace 2 mg daily, Provera 2.5 mg for 5 days each month.   . Repeated falls 07/30/2011  . Sepsis ([place]) 04/04/2016  . Shortness of breath    ALLERGIES ARE "ACTING UP"  . SOB (shortness of breath)   . THRUSH 05/20/2008   Qualifier: Diagnosis of  By: Tomma Lightning MD, Claiborne Billings      . Tobacco use disorder 04/14/2010    Patient Active Problem List   Diagnosis Date Noted  . Anxiety and depression 12/29/2017  . Acute respiratory failure with hypoxia ([place]) 12/28/2017  . Syncope 08/29/2017  . DKA (diabetic ketoacidoses) ([place]) 03/02/2017  . COPD exacerbation ([place]) 02/23/2017  . Acute on chronic respiratory failure with hypoxia and hypercapnia ([place]) 08/19/2016  . Leukocytosis 08/19/2016  . CKD (chronic kidney disease), stage III ([place]) 08/19/2016  . Altered mental status 08/19/2016  . Asthma exacerbation 06/18/2016  . Insomnia 06/12/2016  . Difficult intravenous access   . Acute on chronic respiratory failure with hypoxia ([place]) 05/19/2016  . NSTEMI (non-ST elevated myocardial infarction) ([place]) 05/19/2016  . Acute metabolic encephalopathy 74/94/4967  . Hyperlipidemia   . Class 3 obesity due to excess calories with serious comorbidity and body mass index (BMI) of 40.0 to 44.9 in adult   . Obesity hypoventilation syndrome ([place])   . Diastolic dysfunction 59/16/3846  . Diabetic neuropathy ([place]) 11/10/2014  . Fracture of distal femur ([place]) 07/08/2014  . Osteoporosis 07/08/2014  . Femur fracture, left ([place]) 07/08/2014  . Colles' fracture of left radius   . Left hip pain 05/03/2014  . Perimenopausal symptoms 02/13/2012  . Hyponatremia 12/29/2011  . Asthma exacerbation attacks 12/28/2011  . Bronchitis 12/28/2011  . Repeated falls 07/30/2011  . Hip fracture, left ([place]) 07/16/2011  . Femoral neck fracture ([place]) 07/16/2011  . Tobacco  abuse 04/14/2010  . Hyperlipidemia associated with type 2 diabetes mellitus ([place]) 12/23/2008  . COPD with acute bronchitis ([place]) 11/29/2008  . THRUSH 05/20/2008  . Human immunodeficiency virus (HIV) disease ([place]) 04/19/2008  . Diabetes mellitus type 2 with complications, uncontrolled ([place]) 04/19/2008  . Mood disorder ([place]) 04/19/2008  . Hereditary and idiopathic peripheral neuropathy 04/19/2008  . Hypertension associated with diabetes ([place]) 04/19/2008    Past Surgical History:  Procedure Laterality Date  . AMPUTATION Left 11/18/2014   Procedure: LEFT BELOW KNEE AMPUTATION;  Surgeon: Leandrew Koyanagi, MD;  Location: [facility];  Service: Orthopedics;  Laterality: Left;  . CAST APPLICATION Left 06/05/9933   Procedure: CAST APPLICATION (FIBERGLASS);  Surgeon: Latanya Maudlin, MD;  Location: [facility];  Service: Orthopedics;  Laterality: Left;  CLOSED REDUCTION OF LEFT COLLES FRACTURE WITH SHORT ARM CAST  . HARDWARE REMOVAL Left 05/05/2014   Procedure:  HARDWARE REMOVAL LEFT HIP;  Surgeon: Latanya Maudlin, MD;  Location: [facility];  Service: Orthopedics;  Laterality: Left;  REMOVAL BIOMET 6.5-8.0 CANNULATED SCREW  . HIP ARTHROPLASTY Left 05/05/2014   Procedure: ARTHROPLASTY OPEN REDUCTION INTERNAL FIXATION LEFT HIP AND REMOVAL OF TWO CANNULATED SCREW;  Surgeon: Latanya Maudlin, MD;  Location: [facility];  Service: Orthopedics;  Laterality: Left;  . HIP PINNING,CANNULATED  07/16/2011   Procedure: CANNULATED HIP PINNING;  Surgeon: Gearlean Alf, MD;  Location: [facility];  Service: Orthopedics;  Laterality: Left;  . HIP PINNING,CANNULATED Left 04/09/2012   Procedure: CANNULATED HIP PINNING AND HARDWARE REVISION;  Surgeon: Gearlean Alf, MD;  Location: [facility];  Service: Orthopedics;  Laterality: Left;  . I&D EXTREMITY Left 11/16/2014   Procedure:  DEBRIDEMENT OF LEFT FOOT POSSIBLE APPLICATION OF INTEGRIA AND VAC ;  Surgeon: Irene Limbo, MD;  Location: [facility];  Service: Plastics;  Laterality: Left;  . PERIPHERAL VASCULAR  CATHETERIZATION N/A 11/15/2014   Procedure: Abdominal Aortogram;  Surgeon: Conrad Haxtun, MD;  Location: [facility];  Service: Cardiovascular;  Laterality: N/A;  . PERIPHERAL VASCULAR CATHETERIZATION  11/15/2014   Procedure: Lower Extremity Angiography;  Surgeon: Conrad [place] Ferry, MD;  Location: [facility];  Service: Cardiovascular;;  . PERIPHERAL VASCULAR CATHETERIZATION Left 11/15/2014   Procedure: Peripheral Vascular Intervention;  Surgeon: Conrad [place], MD;  Location: [facility];  Service: Cardiovascular;  Laterality: Left;  popliteal artery stenting     OB History    Gravida  2   Para  1   Term  1   Preterm  0   AB  1   Living  1     SAB  1   TAB      Ectopic  0   Multiple  0   Live Births               Home Medications    Prior to Admission medications   Medication Sig Start Date End Date Taking? Authorizing Provider  albuterol (PROVENTIL HFA;VENTOLIN HFA) 108 (90 Base) MCG/ACT inhaler INHALE 2 PUFFS INTO THE LUNGS EVERY 6 HOURS AS NEEDED FOR WHEEZING OR SHORTNESS OF BREATH 10/03/17  Yes Medina-Vargas, Monina C, NP  amoxicillin-clavulanate (AUGMENTIN) 875-125 MG tablet Take 1 tablet by mouth 2 (two) times daily for 3 days. 01/01/18 01/04/18 Yes Regalado, Belkys A, MD  ARIPiprazole (ABILIFY) 15 MG tablet Take 1 tablet (15 mg total) by mouth daily. 10/03/17  Yes Medina-Vargas, Monina C, NP  azithromycin (ZITHROMAX) 500 MG tablet Take 1 tablet (500 mg total) by mouth daily for 3 days. Take 1 tablet daily for 3 days. 01/01/18 01/04/18 Yes Regalado, Belkys A, MD  clonazePAM (KLONOPIN) 0.5 MG tablet Take 0.5 tablets (0.25 mg total) by mouth 2 (two) times daily as needed for anxiety. 10/03/17  Yes Medina-Vargas, Monina C, NP  doxepin (SINEQUAN) 10 MG capsule Take 1 capsule (10 mg total) by mouth at bedtime. 10/03/17  Yes Medina-Vargas, Monina C, NP  elvitegravir-cobicistat-emtricitabine-tenofovir (GENVOYA) 150-150-200-10 MG TABS tablet Take 1 tablet by mouth  daily with breakfast.   Yes [provider]  fenofibrate (TRICOR) 145 MG tablet Take 1 tablet (145 mg total) by mouth daily. 10/03/17  Yes Medina-Vargas, Monina C, NP  fluticasone furoate-vilanterol (BREO ELLIPTA) 100-25 MCG/INH AEPB INHALE 1 PUFF INTO THE LUNGS DAILY 12/26/17  Yes Chesley Mires, MD  guaiFENesin-dextromethorphan (ROBITUSSIN DM) 100-10 MG/5ML syrup Take 5 mLs by mouth every 4 (four) hours as needed for cough. 01/01/18  Yes Regalado, Belkys A, MD  ipratropium-albuterol (DUONEB) 0.5-2.5 (3) MG/3ML SOLN Take 3 mLs by nebulization every 6 (six) hours. 01/01/18  Yes Regalado, Belkys A, MD  MAGNESIUM-OXIDE 400 (241.3 Mg) MG tablet Take 400 mg by mouth daily.  02/15/17  Yes [provider]  pantoprazole (PROTONIX) 40 MG tablet Take 1 tablet (40 mg total) by mouth daily. 10/03/17  Yes Medina-Vargas, Monina C, NP  phosphorus (K PHOS NEUTRAL) 155-852-130 MG tablet Take 2 tablets (500 mg total) by mouth 3 (three) times daily. 01/01/18  Yes Regalado, Belkys A, MD  predniSONE (DELTASONE) 20 MG tablet Take 2 tablets (40 mg total) by mouth daily with breakfast. 01/02/18  Yes Regalado, Belkys A, MD  pregabalin (LYRICA) 100 MG capsule Take 1 capsule (100 mg total) by mouth 2 (two) times daily. 10/07/17  Yes Campbell Riches, MD  rosuvastatin (CRESTOR) 20 MG tablet Take 1 tablet (20 mg total) by mouth daily. Reported on 05/02/2015 10/03/17  Yes Medina-Vargas, Monina C, NP  temazepam (RESTORIL) 7.5 MG capsule Take 1 capsule (7.5 mg total) by mouth at bedtime as needed for sleep. 10/03/17  Yes Medina-Vargas, Monina C, NP  TOUJEO MAX SOLOSTAR 300 UNIT/ML SOPN Inject 70 Units into the skin 2 (two) times daily.  10/04/17  Yes [provider]  diphenoxylate-atropine (LOMOTIL) 2.5-0.025 MG tablet Take 1 tablet by mouth 4 (four) times daily as needed for diarrhea or loose stools. 11/20/16   Campbell Riches, MD  glucose blood (ACCU-CHEK AVIVA) test strip Use as instructed to check blood sugar 2  times daily. 12/24/17   Elayne Snare, MD  insulin aspart (NOVOLOG) 100 UNIT/ML injection Inject 12 Units into the skin 3 (three) times daily with meals. Patient not taking: Reported on 01/02/2018 10/03/17   Medina-Vargas, Monina C, NP  Multiple Vitamin (MULTIVITAMIN WITH MINERALS) TABS tablet Take 1 tablet by mouth daily. Patient not taking: Reported on 12/28/2017 09/10/17   Modena Jansky, MD  nicotine (NICODERM CQ - DOSED IN MG/24 HOURS) 21 mg/24hr patch Place 1 patch (21 mg total) onto the skin daily. Patient not taking: Reported on 12/28/2017 10/03/17   Medina-Vargas, Senaida Lange, NP    Family History Family History  Problem Relation Age of Onset  . Diabetes Mother   . Hypertension Mother   . Vision loss Mother   . Heart disease Mother   . Hypertension Father   . Thyroid disease Neg Hx     Social History Social History   Tobacco Use  . Smoking status: Current Every Day Smoker    Packs/day: 0.50    Years: 35.00    Pack years: 17.50    Types: E-cigarettes, Cigarettes  . Smokeless tobacco: Never Used  Substance Use Topics  . Alcohol use: No    Alcohol/week: 0.0 standard drinks    Comment: no drink since 2008  . Drug use: No    Comment: hx of crack/cocaine, clean 8 years     Allergies   Cortizone-10 [hydrocortisone]   Review of Systems Review of Systems  Constitutional: Negative for chills and fever.  HENT: Negative for congestion, rhinorrhea, sinus pain and sore throat.   Eyes: Negative for visual disturbance.  Respiratory: Negative for cough, chest tightness and shortness of breath.   Cardiovascular: Negative for chest pain, palpitations and leg swelling.  Gastrointestinal: Negative for abdominal pain, diarrhea, nausea and vomiting.  Genitourinary: Negative for dysuria and flank pain.  Musculoskeletal: Negative for back pain and myalgias.  Skin: Negative for rash.  Neurological: Positive  for weakness. Negative for dizziness, syncope, light-headedness and headaches.         +Generalized weakness.     Physical Exam Updated Vital Signs BP (!) 145/104   Pulse 86   Temp 98 F (36.7 C) (Oral)   Resp 18   Ht 5' 10"  (1.778 m)   Wt 107 kg   LMP 11/16/2010   SpO2 90%   BMI 33.86 kg/m   Physical Exam Vitals signs and nursing note reviewed.  Constitutional:      General: She is not in acute distress.    Appearance: She is well-developed.  HENT:     Head: Normocephalic and atraumatic.  Eyes:     Conjunctiva/sclera: Conjunctivae normal.     Pupils: Pupils are equal, round, and reactive to light.  Neck:     Musculoskeletal: Normal range of motion and neck supple.  Cardiovascular:     Rate and Rhythm: Normal rate and regular rhythm.     Heart sounds: S1 normal and S2 normal. No murmur.  Pulmonary:     Effort: Pulmonary effort is normal. No tachypnea, accessory muscle usage or respiratory distress.     Breath sounds: Examination of the right-upper field reveals wheezing. Examination of the left-upper field reveals wheezing. Wheezing present. No rales.  Abdominal:     General: There is no distension.     Palpations: Abdomen is soft.     Tenderness: There is no abdominal tenderness. There is no guarding.  Musculoskeletal: Normal range of motion.        General: No deformity.     Right lower leg: No edema.     Comments: Left BKA present.   Lymphadenopathy:     Cervical: No cervical adenopathy.  Skin:    General: Skin is warm and dry.     Findings: No erythema or rash.  Neurological:     Mental Status: She is alert.     Comments: Cranial nerves grossly intact. Patient moves extremities symmetrically and with good coordination.  Psychiatric:        Behavior: Behavior normal.        Thought Content: Thought content normal.        Judgment: Judgment normal.      ED Treatments / Results  Labs (all labs ordered are listed, but only abnormal results are displayed) Labs Reviewed  CBC WITH DIFFERENTIAL/PLATELET - Abnormal; Notable for the  following components:      Result Value   WBC 10.8 (*)    RDW 18.9 (*)    Neutro Abs 9.2 (*)    Abs Immature Granulocytes 0.19 (*)    All other components within normal limits  URINE CULTURE  COMPREHENSIVE METABOLIC PANEL  URINALYSIS, ROUTINE W REFLEX MICROSCOPIC  MAGNESIUM  BRAIN NATRIURETIC PEPTIDE  I-STAT TROPONIN, ED    EKG EKG Interpretation  Date/Time:  Thursday January 02 2018 15:41:07 EST Ventricular Rate:  97 PR Interval:    QRS Duration: 89 QT Interval:  341 QTC Calculation: 434 R Axis:   -2 Text Interpretation:  Sinus rhythm Low voltage, precordial leads Confirmed by Davonna Belling [phone]) on 01/02/2018 4:25:51 PM   Radiology No results found.  Procedures Procedures (including critical care time)  Medications Ordered in ED Medications  albuterol (PROVENTIL) (2.5 MG/3ML) 0.083% nebulizer solution 5 mg (5 mg Nebulization Given 01/02/18 1513)  ipratropium (ATROVENT) nebulizer solution 0.5 mg (0.5 mg Nebulization Given 01/02/18 1513)     Initial Impression / Assessment and Plan / ED Course  I have  reviewed the triage vital signs and the nursing notes.  Pertinent labs & imaging results that were available during my care of the patient were reviewed by me and considered in my medical decision making (see chart for details).     Patient nontoxic-appearing, afebrile, no acute distress.  Patient does have oxygen saturation 90% at rest.  Unclear that her resting saturation is given COPD.  She does wear oxygen at night.  Patient is afebrile.  Patient is currently finishing outpatient course of antibiotics.  Differential diagnosis includes worsening debilitation, generalized weakness, worsening kidney failure, pulmonary embolism.  Given recent hospitalization, will recheck patient's lab work, and also check for pulmonary embolism with CTPA.  Case was discussed with case management, Haze Justin, RN.  Appears the patient declined PT OT at home as an inpatient.  Pt  declines this. I feel that patient may need additional services. Face to face order placed.   If work-up is negative for any acute abnormality requiring hospitalization, anticipate attempted assisted ambulation with ambulatory pulse ox. Care signed out to Martinique Robinson, PA-C at 4:09 PM to wait workup and disposition.   This is a supervised visit with Dr. Davonna Belling. Evaluation, management, and disposition planning discussed with this attending physician.  Final Clinical Impressions(s) / ED Diagnoses   Final diagnoses:  Generalized weakness  Shortness of breath    ED Discharge Orders    None       Tamala Julian 01/02/18 1736    Davonna Belling, MD 01/14/18 0008

## 2018-01-02 NOTE — ED Notes (Signed)
Bed: WA14 Expected date:  Expected time:  Means of arrival:  Comments: EMS-[facility] 

## 2018-01-02 NOTE — ED Notes (Signed)
[facility] called for transport.  

## 2018-01-03 ENCOUNTER — Other Ambulatory Visit: Payer: Self-pay | Admitting: Infectious Diseases

## 2018-01-03 ENCOUNTER — Telehealth: Payer: Self-pay

## 2018-01-03 DIAGNOSIS — Z7401 Bed confinement status: Secondary | ICD-10-CM | POA: Diagnosis not present

## 2018-01-03 DIAGNOSIS — J449 Chronic obstructive pulmonary disease, unspecified: Secondary | ICD-10-CM | POA: Diagnosis not present

## 2018-01-03 DIAGNOSIS — M2550 Pain in unspecified joint: Secondary | ICD-10-CM | POA: Diagnosis not present

## 2018-01-03 LAB — CULTURE, BLOOD (ROUTINE X 2)
Culture: NO GROWTH
Culture: NO GROWTH
Special Requests: ADEQUATE
Special Requests: ADEQUATE

## 2018-01-03 NOTE — ED Notes (Signed)
Patient given PO fluids and a snack, waiting on [facility].

## 2018-01-03 NOTE — Telephone Encounter (Signed)
Patient is overdue for follow-up appointment with Dr. Johnnye Sima. Patient will need to schedule appointment for lab work and office visit two weeks after. Left message for patient to call office back. [place]

## 2018-01-04 DIAGNOSIS — I10 Essential (primary) hypertension: Secondary | ICD-10-CM | POA: Diagnosis not present

## 2018-01-04 DIAGNOSIS — E1165 Type 2 diabetes mellitus with hyperglycemia: Secondary | ICD-10-CM | POA: Diagnosis not present

## 2018-01-04 LAB — URINE CULTURE: Culture: 50000 — AB

## 2018-01-05 ENCOUNTER — Emergency Department (HOSPITAL_COMMUNITY): Payer: Medicare HMO

## 2018-01-05 ENCOUNTER — Emergency Department (HOSPITAL_COMMUNITY)
Admission: EM | Admit: 2018-01-05 | Discharge: 2018-01-05 | Disposition: A | Discharge status: Home / Self Care | Payer: Medicare HMO | Attending: Emergency Medicine | Admitting: Emergency Medicine

## 2018-01-05 ENCOUNTER — Telehealth: Payer: Self-pay | Admitting: Emergency Medicine

## 2018-01-05 ENCOUNTER — Encounter (HOSPITAL_COMMUNITY): Payer: Self-pay | Admitting: Emergency Medicine

## 2018-01-05 DIAGNOSIS — N184 Chronic kidney disease, stage 4 (severe): Secondary | ICD-10-CM | POA: Insufficient documentation

## 2018-01-05 DIAGNOSIS — F17290 Nicotine dependence, other tobacco product, uncomplicated: Secondary | ICD-10-CM | POA: Diagnosis not present

## 2018-01-05 DIAGNOSIS — M2550 Pain in unspecified joint: Secondary | ICD-10-CM | POA: Diagnosis not present

## 2018-01-05 DIAGNOSIS — E1165 Type 2 diabetes mellitus with hyperglycemia: Secondary | ICD-10-CM | POA: Insufficient documentation

## 2018-01-05 DIAGNOSIS — Z7401 Bed confinement status: Secondary | ICD-10-CM | POA: Diagnosis not present

## 2018-01-05 DIAGNOSIS — Z79899 Other long term (current) drug therapy: Secondary | ICD-10-CM | POA: Insufficient documentation

## 2018-01-05 DIAGNOSIS — F17210 Nicotine dependence, cigarettes, uncomplicated: Secondary | ICD-10-CM | POA: Diagnosis not present

## 2018-01-05 DIAGNOSIS — E1122 Type 2 diabetes mellitus with diabetic chronic kidney disease: Secondary | ICD-10-CM | POA: Insufficient documentation

## 2018-01-05 DIAGNOSIS — I129 Hypertensive chronic kidney disease with stage 1 through stage 4 chronic kidney disease, or unspecified chronic kidney disease: Secondary | ICD-10-CM | POA: Diagnosis not present

## 2018-01-05 DIAGNOSIS — R918 Other nonspecific abnormal finding of lung field: Secondary | ICD-10-CM | POA: Diagnosis not present

## 2018-01-05 DIAGNOSIS — J449 Chronic obstructive pulmonary disease, unspecified: Secondary | ICD-10-CM | POA: Insufficient documentation

## 2018-01-05 DIAGNOSIS — R739 Hyperglycemia, unspecified: Secondary | ICD-10-CM

## 2018-01-05 DIAGNOSIS — Z89512 Acquired absence of left leg below knee: Secondary | ICD-10-CM | POA: Diagnosis not present

## 2018-01-05 DIAGNOSIS — R062 Wheezing: Secondary | ICD-10-CM | POA: Diagnosis not present

## 2018-01-05 DIAGNOSIS — R531 Weakness: Secondary | ICD-10-CM | POA: Insufficient documentation

## 2018-01-05 LAB — COMPREHENSIVE METABOLIC PANEL
ALT: 28 U/L (ref 0–44)
AST: 14 U/L — ABNORMAL LOW (ref 15–41)
Albumin: 3.2 g/dL — ABNORMAL LOW (ref 3.5–5.0)
Alkaline Phosphatase: 49 U/L (ref 38–126)
Anion gap: 10 (ref 5–15)
BUN: 32 mg/dL — ABNORMAL HIGH (ref 6–20)
CO2: 28 mmol/L (ref 22–32)
Calcium: 8.8 mg/dL — ABNORMAL LOW (ref 8.9–10.3)
Chloride: 100 mmol/L (ref 98–111)
Creatinine, Ser: 1.16 mg/dL — ABNORMAL HIGH (ref 0.44–1.00)
GFR calc Af Amer: >60 mL/min (ref >60)
GFR calc non Af Amer: 54 mL/min — ABNORMAL LOW (ref >60)
Glucose, Bld: 390 mg/dL — ABNORMAL HIGH (ref 70–99)
POTASSIUM: 4.5 mmol/L (ref 3.5–5.1)
Sodium: 138 mmol/L (ref 135–145)
Total Bilirubin: 0.5 mg/dL (ref 0.3–1.2)
Total Protein: 6 g/dL — ABNORMAL LOW (ref 6.5–8.1)

## 2018-01-05 LAB — CBC WITH DIFFERENTIAL/PLATELET
Abs Immature Granulocytes: 0.37 10*3/uL — ABNORMAL HIGH (ref 0.00–0.07)
Basophils Absolute: 0 10*3/uL (ref 0.0–0.1)
Basophils Relative: 0 %
Eosinophils Absolute: 0.1 10*3/uL (ref 0.0–0.5)
Eosinophils Relative: 0 %
HCT: 41.1 % (ref 36.0–46.0)
Hemoglobin: 12.6 g/dL (ref 12.0–15.0)
Immature Granulocytes: 2 %
Lymphocytes Relative: 11 %
Lymphs Abs: 1.8 10*3/uL (ref 0.7–4.0)
MCH: 28.6 pg (ref 26.0–34.0)
MCHC: 30.7 g/dL (ref 30.0–36.0)
MCV: 93.2 fL (ref 80.0–100.0)
Monocytes Absolute: 0.8 10*3/uL (ref 0.1–1.0)
Monocytes Relative: 5 %
NEUTROS ABS: 14 10*3/uL — AB (ref 1.7–7.7)
Neutrophils Relative %: 82 %
Platelets: 298 10*3/uL (ref 150–400)
RBC: 4.41 MIL/uL (ref 3.87–5.11)
RDW: 19.9 % — ABNORMAL HIGH (ref 11.5–15.5)
WBC: 17 10*3/uL — ABNORMAL HIGH (ref 4.0–10.5)
nRBC: 0 % (ref 0.0–0.2)

## 2018-01-05 LAB — I-STAT CG4 LACTIC ACID, ED: Lactic Acid, Venous: 1.65 mmol/L (ref 0.5–1.9)

## 2018-01-05 LAB — BRAIN NATRIURETIC PEPTIDE: B NATRIURETIC PEPTIDE 5: 29.8 pg/mL (ref 0.0–100.0)

## 2018-01-05 LAB — TROPONIN I: Troponin I: <0.03 ng/mL (ref <0.03)

## 2018-01-05 MED ORDER — IPRATROPIUM BROMIDE 0.02 % IN SOLN
0.5000 mg | Freq: Once | RESPIRATORY_TRACT | Status: AC
  Administered 2018-01-05: 0.5 mg via RESPIRATORY_TRACT
  Filled 2018-01-05: qty 2.5

## 2018-01-05 MED ORDER — ALBUTEROL SULFATE (2.5 MG/3ML) 0.083% IN NEBU
5.0000 mg | INHALATION_SOLUTION | Freq: Once | RESPIRATORY_TRACT | Status: AC
  Administered 2018-01-05: 5 mg via RESPIRATORY_TRACT
  Filled 2018-01-05: qty 6

## 2018-01-05 MED ORDER — SODIUM CHLORIDE 0.9 % IV BOLUS
500.0000 mL | Freq: Once | INTRAVENOUS | Status: AC
  Administered 2018-01-05: 500 mL via INTRAVENOUS

## 2018-01-05 NOTE — ED Triage Notes (Signed)
Per EMS ,pt. From home with complaint of generalized weakness  and lift assist to use the bathroom x 3 days the patient. Seen here on 01/02. For pneumonia. Pt called EMS everyday  X days now for assistance to stand up and use bathroom. Today pt. Called  EMS 3 times for the same reason. Denied pain . cbg was 446 per EMS.

## 2018-01-05 NOTE — ED Notes (Signed)
Bed: WA09 Expected date:  Expected time:  Means of arrival:  Comments: EMS 53 yo female lift assist-unable to stand/generalized weakness-recent admission for pneumonia 117/58 CBG 446

## 2018-01-05 NOTE — ED Provider Notes (Signed)
[facility] DEPT Provider Note   CSN: [id number] Arrival date & time: 01/05/18  0100     History   Chief Complaint Chief Complaint  Patient presents with  . generalized weakness    x 3 days    HPI [name] is a 53 y.o. female with a history of CKD stage IV, obesity, COPD, diabetes mellitus type 2 who presents to the emergency department by EMS from home with a chief complaint of generalized weakness.  She reports she has been feeling more weak in her bilateral legs for the last 3 days.  States that she uses a wheelchair at home, but does stand to transfer to toilet.    Upon further investigation, she states that she became stuck earlier today and was unable to get up from her toilet.  She used her life alert and police told her that they would be unable to assist her and she needed EMS.  EMS transported her to the hospital from her home.  She has a history of a left BKA.  She has no other complaints at this time including numbness, slurred speech, facial droop, shortness of breath, chest pain, fever, chills, nausea, vomiting, diarrhea.  She lives at home and has home health daily from noon to 2.  Reports that she will be getting 24-hour home health care on January 6 as well as in-home PT and OT 3 times a week.  States that she was told her blood sugar was 420 this morning.  She states "I might have missed some doses of my home medication."   The history is provided by the patient. No language interpreter was used.    Past Medical History:  Diagnosis Date  . Acute metabolic encephalopathy 3/66/2947  . Acute on chronic respiratory failure with hypoxia ([place]) 05/19/2016  . Anxiety   . ARF (acute renal failure) ([place]) 05/03/2014  . Arthritis    LEFT HIP  . Asthma    HOSPITALIZED WITH EXCERBATION OF ASTHMA - AND BRONCHITIS AND THE FLU DEC 2013  . Asthma exacerbation attacks 12/28/2011  . Bronchitis 12/28/2011  . CAP (community acquired pneumonia)  07/25/2012  . Chronic kidney disease (CKD), stage IV (severe) ([place]) 11/10/2014  . Class 3 obesity due to excess calories with serious comorbidity and body mass index (BMI) of 40.0 to 44.9 in adult   . Colles' fracture of left radius   . COPD (chronic obstructive pulmonary disease) ([place])   . Depression   . Diabetes mellitus    ON INSULIN AND ORAL MEDICATIONS  . Diabetes mellitus type 2 with complications, uncontrolled ([place]) 04/19/2008   Qualifier: Diagnosis of  By: Tomma Lightning MD, Claiborne Billings     . Diabetic foot infection ([place]) 11/09/2014  . Diabetic ketoacidosis without coma associated with type 2 diabetes mellitus ([place])   . Diabetic neuropathy ([place]) 11/10/2014  . Diarrhea 08/30/2008   Qualifier: Diagnosis of  By: Tomma Lightning MD, Claiborne Billings    . Diastolic dysfunction 65/04/6501  . Difficult intravenous access   . DKA, type 2 ([place]) 04/04/2016  . Dyslipidemia 12/23/2008   Qualifier: Diagnosis of  By: Tomma Lightning MD, Claiborne Billings    . Essential hypertension 04/19/2008   Qualifier: Diagnosis of  By: Tomma Lightning MD, Claiborne Billings    . FATIGUE 07/12/2008   Qualifier: Diagnosis of  By: Tomma Lightning MD, Claiborne Billings    . Femoral neck fracture ([place]) 07/16/2011  . Femur fracture, left ([place]) 07/08/2014  . Fever   . Fracture of distal femur ([place]) 07/08/2014  .  GERD (gastroesophageal reflux disease)   . Hereditary and idiopathic peripheral neuropathy 04/19/2008   Qualifier: Diagnosis of  By: Tomma Lightning MD, Claiborne Billings    . Hip fracture, left ([place]) 07/16/2011  . Hip pain   . HIV infection ([place]) 2000  . Human immunodeficiency virus (HIV) disease ([place]) 04/19/2008   [id number] +   . Hyperlipidemia   . Hypertension   . Hyponatremia 12/29/2011  . Influenza A 12/29/2011  . Influenza B   . Insomnia 06/12/2016  . Left hip pain 05/03/2014  . Mood disorder ([place]) 04/19/2008   Qualifier: Diagnosis of  By: Tomma Lightning MD, Claiborne Billings    . Neuropathy    NEUROPATHY HANDS AND FEET  . NSTEMI (non-ST elevated myocardial infarction) ([place]) 05/19/2016  . Obesity hypoventilation syndrome ([place])   .  Osteoporosis 07/08/2014  . Pain    SEVERE PAIN LEFT HIP - HX OF LEFT HIP PINNING JULY 2013  . Perimenopausal symptoms 02/13/2012   LMP around 2011. On estrace and provera since around 2012 for hot flashes, mood swings. Estrace 2 mg daily, Provera 2.5 mg for 5 days each month.   . Repeated falls 07/30/2011  . Sepsis ([place]) 04/04/2016  . Shortness of breath    ALLERGIES ARE "ACTING UP"  . SOB (shortness of breath)   . THRUSH 05/20/2008   Qualifier: Diagnosis of  By: Tomma Lightning MD, Claiborne Billings      . Tobacco use disorder 04/14/2010    Patient Active Problem List   Diagnosis Date Noted  . Anxiety and depression 12/29/2017  . Acute respiratory failure with hypoxia ([place]) 12/28/2017  . Syncope 08/29/2017  . DKA (diabetic ketoacidoses) ([place]) 03/02/2017  . COPD exacerbation ([place]) 02/23/2017  . Acute on chronic respiratory failure with hypoxia and hypercapnia ([place]) 08/19/2016  . Leukocytosis 08/19/2016  . CKD (chronic kidney disease), stage III ([place]) 08/19/2016  . Altered mental status 08/19/2016  . Asthma exacerbation 06/18/2016  . Insomnia 06/12/2016  . Difficult intravenous access   . Acute on chronic respiratory failure with hypoxia ([place]) 05/19/2016  . NSTEMI (non-ST elevated myocardial infarction) ([place]) 05/19/2016  . Acute metabolic encephalopathy 19/50/9326  . Hyperlipidemia   . Class 3 obesity due to excess calories with serious comorbidity and body mass index (BMI) of 40.0 to 44.9 in adult   . Obesity hypoventilation syndrome ([place])   . Diastolic dysfunction 71/24/5809  . Diabetic neuropathy ([place]) 11/10/2014  . Fracture of distal femur ([place]) 07/08/2014  . Osteoporosis 07/08/2014  . Femur fracture, left ([place]) 07/08/2014  . Colles' fracture of left radius   . Left hip pain 05/03/2014  . Perimenopausal symptoms 02/13/2012  . Hyponatremia 12/29/2011  . Asthma exacerbation attacks 12/28/2011  . Bronchitis 12/28/2011  . Repeated falls 07/30/2011  . Hip fracture, left ([place]) 07/16/2011  . Femoral  neck fracture ([place]) 07/16/2011  . Tobacco abuse 04/14/2010  . Hyperlipidemia associated with type 2 diabetes mellitus ([place]) 12/23/2008  . COPD with acute bronchitis ([place]) 11/29/2008  . THRUSH 05/20/2008  . Human immunodeficiency virus (HIV) disease ([place]) 04/19/2008  . Diabetes mellitus type 2 with complications, uncontrolled ([place]) 04/19/2008  . Mood disorder ([place]) 04/19/2008  . Hereditary and idiopathic peripheral neuropathy 04/19/2008  . Hypertension associated with diabetes ([place]) 04/19/2008    Past Surgical History:  Procedure Laterality Date  . AMPUTATION Left 11/18/2014   Procedure: LEFT BELOW KNEE AMPUTATION;  Surgeon: Leandrew Koyanagi, MD;  Location: [facility];  Service: Orthopedics;  Laterality: Left;  . CAST APPLICATION Left 09/08/3380   Procedure: CAST APPLICATION (FIBERGLASS);  Surgeon: Latanya Maudlin, MD;  Location: [facility];  Service: Orthopedics;  Laterality: Left;  CLOSED REDUCTION OF LEFT COLLES FRACTURE WITH SHORT ARM CAST  . HARDWARE REMOVAL Left 05/05/2014   Procedure: HARDWARE REMOVAL LEFT HIP;  Surgeon: Latanya Maudlin, MD;  Location: [facility];  Service: Orthopedics;  Laterality: Left;  REMOVAL BIOMET 6.5-8.0 CANNULATED SCREW  . HIP ARTHROPLASTY Left 05/05/2014   Procedure: ARTHROPLASTY OPEN REDUCTION INTERNAL FIXATION LEFT HIP AND REMOVAL OF TWO CANNULATED SCREW;  Surgeon: Latanya Maudlin, MD;  [facility]: [facility];  Service: Orthopedics;  Laterality: Left;  . HIP PINNING,CANNULATED  07/16/2011   Procedure: CANNULATED HIP PINNING;  Surgeon: Gearlean Alf, MD;  [facility];  Service: Orthopedics;  Laterality: Left;  . HIP PINNING,CANNULATED Left 04/09/2012   Procedure: CANNULATED HIP PINNING AND HARDWARE REVISION;  Surgeon: Gearlean Alf, MD;  [facility];  Service: Orthopedics;  Laterality: Left;  . I&D EXTREMITY Left 11/16/2014   Procedure:  DEBRIDEMENT OF LEFT FOOT POSSIBLE APPLICATION OF INTEGRIA AND VAC ;  Surgeon: Irene Limbo, MD;  Location: [facility];  Service: Plastics;   Laterality: Left;  . PERIPHERAL VASCULAR CATHETERIZATION N/A 11/15/2014   Procedure: Abdominal Aortogram;  Surgeon: Conrad [place], MD;  Location: [facility];  Service: Cardiovascular;  Laterality: N/A;  . PERIPHERAL VASCULAR CATHETERIZATION  11/15/2014   Procedure: Lower Extremity Angiography;  Surgeon: Conrad [place], MD;  Location: [facility];  Service: Cardiovascular;;  . PERIPHERAL VASCULAR CATHETERIZATION Left 11/15/2014   Procedure: Peripheral Vascular Intervention;  Surgeon: Conrad , MD;  Location: [facility];  Service: Cardiovascular;  Laterality: Left;  popliteal artery stenting     OB History    Gravida  2   Para  1   Term  1   Preterm  0   AB  1   Living  1     SAB  1   TAB      Ectopic  0   Multiple  0   Live Births               Home Medications    Prior to Admission medications   Medication Sig Start Date End Date Taking? Authorizing Provider  albuterol (PROVENTIL HFA;VENTOLIN HFA) 108 (90 Base) MCG/ACT inhaler INHALE 2 PUFFS INTO THE LUNGS EVERY 6 HOURS AS NEEDED FOR WHEEZING OR SHORTNESS OF BREATH 10/03/17  Yes Medina-Vargas, Monina C, NP  ARIPiprazole (ABILIFY) 15 MG tablet Take 1 tablet (15 mg total) by mouth daily. 10/03/17  Yes Medina-Vargas, Monina C, NP  clonazePAM (KLONOPIN) 0.5 MG tablet Take 0.5 tablets (0.25 mg total) by mouth 2 (two) times daily as needed for anxiety. 10/03/17  Yes Medina-Vargas, Monina C, NP  diphenoxylate-atropine (LOMOTIL) 2.5-0.025 MG tablet Take 1 tablet by mouth 4 (four) times daily as needed for diarrhea or loose stools. 11/20/16  Yes Campbell Riches, MD  doxepin (SINEQUAN) 10 MG capsule Take 1 capsule (10 mg total) by mouth at bedtime. 10/03/17  Yes Medina-Vargas, Monina C, NP  fenofibrate (TRICOR) 145 MG tablet Take 1 tablet (145 mg total) by mouth daily. 10/03/17  Yes Medina-Vargas, Monina C, NP  fluticasone furoate-vilanterol (BREO ELLIPTA) 100-25 MCG/INH AEPB INHALE 1 PUFF INTO THE LUNGS  DAILY 12/26/17  Yes Chesley Mires, MD  GENVOYA 150-150-200-10 MG TABS tablet TAKE 1 TABLET BY MOUTH DAILY 01/03/18  Yes Campbell Riches, MD  glucose blood (ACCU-CHEK AVIVA) test strip Use as instructed to check blood sugar 2 times daily.  12/24/17  Yes Elayne Snare, MD  MAGNESIUM-OXIDE 400 (241.3 Mg) MG tablet Take 400 mg by mouth daily.  02/15/17  Yes [provider]  pantoprazole (PROTONIX) 40 MG tablet Take 1 tablet (40 mg total) by mouth daily. 10/03/17  Yes Medina-Vargas, Monina C, NP  pregabalin (LYRICA) 100 MG capsule Take 1 capsule (100 mg total) by mouth 2 (two) times daily. 10/07/17  Yes Campbell Riches, MD  rosuvastatin (CRESTOR) 20 MG tablet Take 1 tablet (20 mg total) by mouth daily. Reported on 05/02/2015 10/03/17  Yes Medina-Vargas, Monina C, NP  temazepam (RESTORIL) 7.5 MG capsule Take 1 capsule (7.5 mg total) by mouth at bedtime as needed for sleep. 10/03/17  Yes Medina-Vargas, Monina C, NP  TOUJEO MAX SOLOSTAR 300 UNIT/ML SOPN Inject 70 Units into the skin 2 (two) times daily.  10/04/17  Yes [provider]  guaiFENesin-dextromethorphan (ROBITUSSIN DM) 100-10 MG/5ML syrup Take 5 mLs by mouth every 4 (four) hours as needed for cough. 01/01/18   Regalado, Belkys A, MD  ipratropium-albuterol (DUONEB) 0.5-2.5 (3) MG/3ML SOLN Take 3 mLs by nebulization every 6 (six) hours. 01/01/18   Regalado, Jerald Kief A, MD  phosphorus (K PHOS NEUTRAL) 155-852-130 MG tablet Take 2 tablets (500 mg total) by mouth 3 (three) times daily. 01/01/18   Regalado, Belkys A, MD  predniSONE (DELTASONE) 20 MG tablet Take 2 tablets (40 mg total) by mouth daily with breakfast. 01/02/18   Regalado, Cassie Freer, MD    Family History Family History  Problem Relation Age of Onset  . Diabetes Mother   . Hypertension Mother   . Vision loss Mother   . Heart disease Mother   . Hypertension Father   . Thyroid disease Neg Hx     Social History Social History   Tobacco Use  . Smoking status: Current Every Day Smoker     Packs/day: 0.50    Years: 35.00    Pack years: 17.50    Types: E-cigarettes, Cigarettes  . Smokeless tobacco: Never Used  Substance Use Topics  . Alcohol use: No    Alcohol/week: 0.0 standard drinks    Comment: no drink since 2008  . Drug use: No    Comment: hx of crack/cocaine, clean 8 years     Allergies   Cortizone-10 [hydrocortisone]   Review of Systems Review of Systems  Constitutional: Negative for activity change, chills and fever.  Respiratory: Negative for shortness of breath.   Cardiovascular: Negative for chest pain.  Gastrointestinal: Negative for abdominal pain, diarrhea, nausea and vomiting.  Genitourinary: Negative for dysuria.  Musculoskeletal: Negative for back pain.  Skin: Negative for rash.  Allergic/Immunologic: Negative for immunocompromised state.  Neurological: Positive for weakness (generalized). Negative for syncope, numbness and headaches.  Psychiatric/Behavioral: Negative for confusion.     Physical Exam Updated Vital Signs BP 118/88   Pulse 88   Temp 98.2 F (36.8 C) (Oral)   Resp 16   LMP 11/16/2010   SpO2 94%   Physical Exam Vitals signs and nursing note reviewed.  Constitutional:      General: She is not in acute distress.    Comments: Chronically ill-appearing and appears much older than stated age.  HENT:     Head: Normocephalic.  Eyes:     Conjunctiva/sclera: Conjunctivae normal.  Neck:     Musculoskeletal: Neck supple.  Cardiovascular:     Rate and Rhythm: Normal rate and regular rhythm.     Heart sounds: No murmur. No friction rub. No gallop.  Pulmonary:     Effort: Pulmonary effort is normal. No respiratory distress.     Comments: Bilateral wheezing with inspiration and expiration Abdominal:     General: There is no distension.     Palpations: Abdomen is soft.  Musculoskeletal:     Comments: Left BKA.  Sensation is intact to the right lower extremity.  DP and PT pulses are 2+ and symmetric.  5-5 strength against  resistance with dorsiflexion plantarflexion.  Skin:    General: Skin is warm.     Findings: No rash.  Neurological:     Mental Status: She is alert.  Psychiatric:        Behavior: Behavior normal.     Comments: Affect is flat      ED Treatments / Results  Labs (all labs ordered are listed, but only abnormal results are displayed) Labs Reviewed  CBC WITH DIFFERENTIAL/PLATELET - Abnormal; Notable for the following components:      Result Value   WBC 17.0 (*)    RDW 19.9 (*)    Neutro Abs 14.0 (*)    Abs Immature Granulocytes 0.37 (*)    All other components within normal limits  COMPREHENSIVE METABOLIC PANEL - Abnormal; Notable for the following components:   Glucose, Bld 390 (*)    BUN 32 (*)    Creatinine, Ser 1.16 (*)    Calcium 8.8 (*)    Total Protein 6.0 (*)    Albumin 3.2 (*)    AST 14 (*)    GFR calc non Af Amer 54 (*)    All other components within normal limits  CULTURE, BLOOD (ROUTINE X 2)  CULTURE, BLOOD (ROUTINE X 2)  TROPONIN I  BRAIN NATRIURETIC PEPTIDE  URINALYSIS, ROUTINE W REFLEX MICROSCOPIC  I-STAT CG4 LACTIC ACID, ED    EKG EKG Interpretation  Date/Time:  Sunday January 05 2018 01:59:54 EST Ventricular Rate:  82 PR Interval:    QRS Duration: 86 QT Interval:  371 QTC Calculation: 434 R Axis:   6 Text Interpretation:  Sinus rhythm No significant change was found Confirmed by Ezequiel Essex [phone]) on 01/05/2018 2:11:27 AM   Radiology Dg Chest 2 View  Result Date: 01/05/2018 CLINICAL DATA:  Acute onset of generalized weakness. EXAM: CHEST - 2 VIEW COMPARISON:  Chest radiograph and CTA of the chest performed 01/02/2018 FINDINGS: The lungs are mildly hypoexpanded. Minimal left basilar airspace opacity likely reflects atelectasis. Mild vascular crowding and vascular congestion are seen. There is no evidence of pleural effusion or pneumothorax. The heart is mildly enlarged. No acute osseous abnormalities are seen. Chronic right-sided rib deformities  are again noted. IMPRESSION: Lungs mildly hypoexpanded. Minimal left basilar airspace opacity likely reflects atelectasis. Mild vascular congestion and mild cardiomegaly noted. Electronically Signed   By: Garald Balding M.D.   On: 01/05/2018 02:55    Procedures Procedures (including critical care time)  Medications Ordered in ED Medications  albuterol (PROVENTIL) (2.5 MG/3ML) 0.083% nebulizer solution 5 mg (5 mg Nebulization Given 01/05/18 0246)  ipratropium (ATROVENT) nebulizer solution 0.5 mg (0.5 mg Nebulization Given 01/05/18 0246)  sodium chloride 0.9 % bolus 500 mL (0 mLs Intravenous Stopped 01/05/18 0544)  albuterol (PROVENTIL) (2.5 MG/3ML) 0.083% nebulizer solution 5 mg (5 mg Nebulization Given 01/05/18 0458)     Initial Impression / Assessment and Plan / ED Course  I have reviewed the triage vital signs and the nursing notes.  Pertinent labs & imaging results that were available during my care of the patient were reviewed by  me and considered in my medical decision making (see chart for details).     53 year old female with a history of CKD stage IV, obesity, COPD, diabetes mellitus type 2 who presents to the emergency department with a chief complaint of generalized weakness.  The patient was discharged on January 1, and has been seen at the ER twice since discharge.  She lives at home alone and has home health for 2 hours daily as of today, but will have 24-hour care per the patient on January 6.  The patient was seen and evaluated along with Dr. Wyvonnia Dusky, attending physician.  Able for leukocytosis of 17, likely secondary to corticosteroid administration.  Glucose is also noted to be 390.  Anion gap and bicarb are normal.  Will treat with IV fluid bolus.  This x-ray with mild vascular congestion.  Albuterol nebulizer treatment given.  On reevaluation, the patient states that she is feeling much better would like to go home.  She then reveals that she has been feeling more weak and had a  difficult time after toileting today and had to use her life alert.  When police came out, they stated they were unable to help the patient she did EMS.  When EMS came out, they transported her to the ED for further evaluation.  She does live alone, but will have 24-hour home health starting in the next day.  We had a shared decision-making conversation, but the patient was adamant that she felt safe at home and was ready for discharge.  She will also be having an home PT and OT.  Given that her labs are minimally changed today from 3 days ago and she will be having in-home care, I think it is reasonable that we discharge her to home.  Strict return precautions given.  She is hemodynamically stable and in no acute distress.  She is safe for discharge to home with outpatient follow-up at this time.   Final Clinical Impressions(s) / ED Diagnoses   Final diagnoses:  Generalized weakness  Hyperglycemia    ED Discharge Orders    None       Joanne Gavel, PA-C 01/05/18 0729    Ezequiel Essex, MD 01/07/18 0010

## 2018-01-05 NOTE — Discharge Instructions (Signed)
Thank you for allowing me to care for you today in the Emergency Department.   Attending to take your home medications and recheck your blood sugar.  It was elevated today in the emergency department.  Use your home albuterol as prescribed.  Return to the emergency department if you have another fall, worsening weakness, confusion, worsening shortness of breath, high fevers, or other new, concerning symptoms.

## 2018-01-05 NOTE — ED Notes (Signed)
Pt. Stated that she did not want to be taken here in ED, that she just called EMS for lifting assistance because she got stuck in her bathroom. Pt. Wanted to be discharged asap.alety and oriented  X 4 but appeared sleepy.

## 2018-01-05 NOTE — ED Notes (Signed)
Patient transported to X-ray 

## 2018-01-05 NOTE — ED Notes (Signed)
EKG given to EDP,Rancour,MD., for review.

## 2018-01-05 NOTE — Telephone Encounter (Signed)
Post ED Visit - Positive Culture Follow-up  Culture report reviewed by antimicrobial stewardship pharmacist:  []  Elenor Quinones, Pharm.D. []  Heide Guile, Pharm.D., BCPS AQ-ID []  Parks Neptune, Pharm.D., BCPS []  Alycia Rossetti, Pharm.D., BCPS []  [place], Pharm.D., BCPS, AAHIVP []  Legrand Como, Pharm.D., BCPS, AAHIVP []  Salome Arnt, PharmD, BCPS []  Johnnette Gourd, PharmD, BCPS []  Hughes Better, PharmD, BCPS [x]  Albertina Parr, PharmD  Positive Urine culture No further patient follow-up is required at this time.  Larene Beach Tanylah Schnoebelen 01/05/2018, 10:11 AM

## 2018-01-06 ENCOUNTER — Other Ambulatory Visit: Payer: Self-pay

## 2018-01-06 DIAGNOSIS — J449 Chronic obstructive pulmonary disease, unspecified: Secondary | ICD-10-CM | POA: Diagnosis not present

## 2018-01-06 NOTE — Patient Outreach (Signed)
[facility]) Care Management  01/06/2018  [name] 12-25-1965 [id number]    EMMI-GENERAL DISCHARGE  RED ON EMMI ALERT Day # 1 Date: 01/03/18  10:51 AM Red Alert Reason: "Transportation for follow-up? No"  Outreach attempt # 1, unsuccessful. Patient states now is not a good time, states, "I have company". Patient is agreeable to a call back.   Plan: RN CM will make outreach attempt to patient within 3-4 business days.  RN CM will send unsuccessful outreach letter to patient.      Erin Merino, RN,CCM [facility] Care Management Care Management Coordinator Direct Phone: [phone] Toll Free: [phone] Fax: [phone]

## 2018-01-07 ENCOUNTER — Other Ambulatory Visit: Payer: Self-pay

## 2018-01-07 DIAGNOSIS — J449 Chronic obstructive pulmonary disease, unspecified: Secondary | ICD-10-CM | POA: Diagnosis not present

## 2018-01-07 NOTE — Patient Outreach (Signed)
[facility]) Care Management  01/07/2018  JERICCA RUSSETT [birth year] [id number]   EMMI-GENERAL DISCHARGE #2 ATTEMPT  RED ON EMMI ALERT Day # 1 Date: 01/03/18  10:51 AM Red Alert Reason: "Transportation for follow-up? No"   Outreach attempt # 2, unsuccessful. No answer. RN CM left HIPAA compliant voicemail message along with contact info.   Plan: RN CM will make 1 final outreach to patient in 3-4 business days. RN CM will send unsuccessful outreach letter.      Barb Merino, RN,CCM [facility] Care Management Care Management Coordinator Direct Phone: [phone] Toll Free: [phone] Fax: [phone]

## 2018-01-09 DIAGNOSIS — J449 Chronic obstructive pulmonary disease, unspecified: Secondary | ICD-10-CM | POA: Diagnosis not present

## 2018-01-10 ENCOUNTER — Other Ambulatory Visit: Payer: Self-pay

## 2018-01-10 DIAGNOSIS — J449 Chronic obstructive pulmonary disease, unspecified: Secondary | ICD-10-CM | POA: Diagnosis not present

## 2018-01-10 LAB — CULTURE, BLOOD (ROUTINE X 2)
Culture: NO GROWTH
Culture: NO GROWTH
Special Requests: ADEQUATE
Special Requests: ADEQUATE

## 2018-01-10 NOTE — Patient Outreach (Signed)
[facility]) Care Management  01/10/2018  [name] 1965/06/10 [id number]  EMMI-GENERAL DISCHARGE #3 ATTEMPT  RED ON EMMI ALERT Day #1 Date:01/03/18 10:51 AM Red Alert Reason:"Transportation for follow-up? No"  Outreach FOLLOW-UP attempt #3 unsuccessful. No answer and no message left. Multiple attempts to establish contact with patient without success. No response from letter mailed to patient. Case is being closed at this time.   Plan: RN CM will close case due to not being able to reach patient after 3 attempts and no return call from patient has been received.      Barb Merino, RN,CCM [facility] Care Management Care Management Coordinator Direct Phone: [phone] Toll Free: [phone] Fax: [phone]

## 2018-01-13 DIAGNOSIS — J449 Chronic obstructive pulmonary disease, unspecified: Secondary | ICD-10-CM | POA: Diagnosis not present

## 2018-01-14 DIAGNOSIS — J449 Chronic obstructive pulmonary disease, unspecified: Secondary | ICD-10-CM | POA: Diagnosis not present

## 2018-01-16 DIAGNOSIS — J449 Chronic obstructive pulmonary disease, unspecified: Secondary | ICD-10-CM | POA: Diagnosis not present

## 2018-01-16 DIAGNOSIS — F329 Major depressive disorder, single episode, unspecified: Secondary | ICD-10-CM | POA: Diagnosis not present

## 2018-01-16 DIAGNOSIS — I129 Hypertensive chronic kidney disease with stage 1 through stage 4 chronic kidney disease, or unspecified chronic kidney disease: Secondary | ICD-10-CM | POA: Diagnosis not present

## 2018-01-16 DIAGNOSIS — F419 Anxiety disorder, unspecified: Secondary | ICD-10-CM | POA: Diagnosis not present

## 2018-01-16 DIAGNOSIS — N184 Chronic kidney disease, stage 4 (severe): Secondary | ICD-10-CM | POA: Diagnosis not present

## 2018-01-16 DIAGNOSIS — J9611 Chronic respiratory failure with hypoxia: Secondary | ICD-10-CM | POA: Diagnosis not present

## 2018-01-16 DIAGNOSIS — J441 Chronic obstructive pulmonary disease with (acute) exacerbation: Secondary | ICD-10-CM | POA: Diagnosis not present

## 2018-01-16 DIAGNOSIS — M1612 Unilateral primary osteoarthritis, left hip: Secondary | ICD-10-CM | POA: Diagnosis not present

## 2018-01-16 DIAGNOSIS — B20 Human immunodeficiency virus [HIV] disease: Secondary | ICD-10-CM | POA: Diagnosis not present

## 2018-01-16 DIAGNOSIS — E1122 Type 2 diabetes mellitus with diabetic chronic kidney disease: Secondary | ICD-10-CM | POA: Diagnosis not present

## 2018-01-17 DIAGNOSIS — J449 Chronic obstructive pulmonary disease, unspecified: Secondary | ICD-10-CM | POA: Diagnosis not present

## 2018-01-18 ENCOUNTER — Other Ambulatory Visit: Payer: Self-pay | Admitting: Endocrinology

## 2018-01-20 DIAGNOSIS — J449 Chronic obstructive pulmonary disease, unspecified: Secondary | ICD-10-CM | POA: Diagnosis not present

## 2018-01-21 ENCOUNTER — Telehealth (INDEPENDENT_AMBULATORY_CARE_PROVIDER_SITE_OTHER): Payer: Self-pay | Admitting: Orthopaedic Surgery

## 2018-01-21 ENCOUNTER — Other Ambulatory Visit (INDEPENDENT_AMBULATORY_CARE_PROVIDER_SITE_OTHER): Payer: Medicare HMO

## 2018-01-21 DIAGNOSIS — E1122 Type 2 diabetes mellitus with diabetic chronic kidney disease: Secondary | ICD-10-CM | POA: Diagnosis not present

## 2018-01-21 DIAGNOSIS — M1612 Unilateral primary osteoarthritis, left hip: Secondary | ICD-10-CM | POA: Diagnosis not present

## 2018-01-21 DIAGNOSIS — Z794 Long term (current) use of insulin: Secondary | ICD-10-CM

## 2018-01-21 DIAGNOSIS — E875 Hyperkalemia: Secondary | ICD-10-CM | POA: Diagnosis not present

## 2018-01-21 DIAGNOSIS — B20 Human immunodeficiency virus [HIV] disease: Secondary | ICD-10-CM | POA: Diagnosis not present

## 2018-01-21 DIAGNOSIS — J9611 Chronic respiratory failure with hypoxia: Secondary | ICD-10-CM | POA: Diagnosis not present

## 2018-01-21 DIAGNOSIS — F419 Anxiety disorder, unspecified: Secondary | ICD-10-CM | POA: Diagnosis not present

## 2018-01-21 DIAGNOSIS — F329 Major depressive disorder, single episode, unspecified: Secondary | ICD-10-CM | POA: Diagnosis not present

## 2018-01-21 DIAGNOSIS — J441 Chronic obstructive pulmonary disease with (acute) exacerbation: Secondary | ICD-10-CM | POA: Diagnosis not present

## 2018-01-21 DIAGNOSIS — E1165 Type 2 diabetes mellitus with hyperglycemia: Secondary | ICD-10-CM

## 2018-01-21 DIAGNOSIS — J449 Chronic obstructive pulmonary disease, unspecified: Secondary | ICD-10-CM | POA: Diagnosis not present

## 2018-01-21 DIAGNOSIS — N184 Chronic kidney disease, stage 4 (severe): Secondary | ICD-10-CM | POA: Diagnosis not present

## 2018-01-21 DIAGNOSIS — I129 Hypertensive chronic kidney disease with stage 1 through stage 4 chronic kidney disease, or unspecified chronic kidney disease: Secondary | ICD-10-CM | POA: Diagnosis not present

## 2018-01-21 LAB — BASIC METABOLIC PANEL
BUN: 36 mg/dL — ABNORMAL HIGH (ref 6–23)
CALCIUM: 9.6 mg/dL (ref 8.4–10.5)
CO2: 30 mEq/L (ref 19–32)
CREATININE: 1.36 mg/dL — AB (ref 0.40–1.20)
Chloride: 97 mEq/L (ref 96–112)
GFR: 40.55 mL/min — ABNORMAL LOW (ref >60.00)
Glucose, Bld: 291 mg/dL — ABNORMAL HIGH (ref 70–99)
Potassium: 4.9 mEq/L (ref 3.5–5.1)
Sodium: 135 mEq/L (ref 135–145)

## 2018-01-21 LAB — LIPID PANEL
Cholesterol: 161 mg/dL (ref 0–200)
HDL: 47.6 mg/dL (ref >39.00)
NonHDL: 113.01
TRIGLYCERIDES: 234 mg/dL — AB (ref 0.0–149.0)
Total CHOL/HDL Ratio: 3
VLDL: 46.8 mg/dL — ABNORMAL HIGH (ref 0.0–40.0)

## 2018-01-21 LAB — LDL CHOLESTEROL, DIRECT: Direct LDL: 91 mg/dL

## 2018-01-21 LAB — HEMOGLOBIN A1C: Hgb A1c MFr Bld: 8.6 % — ABNORMAL HIGH (ref 4.6–6.5)

## 2018-01-21 NOTE — Telephone Encounter (Signed)
Patient called asked if Rx can be faxed over to [place] so that she can get a new prosthetic leg. Patient said [employer] is off of [employer]. The number to contact patient is [phone]

## 2018-01-21 NOTE — Telephone Encounter (Signed)
Called patient no answer LMOM. Advised her Rx  For biotech was being faxed

## 2018-01-21 NOTE — Telephone Encounter (Signed)
See message below. Last seen 08/20/17. If yes, What would you like Rx to say specifically?

## 2018-01-21 NOTE — Telephone Encounter (Signed)
Rx for new prosthetic leg.

## 2018-01-23 DIAGNOSIS — M1612 Unilateral primary osteoarthritis, left hip: Secondary | ICD-10-CM | POA: Diagnosis not present

## 2018-01-23 DIAGNOSIS — F329 Major depressive disorder, single episode, unspecified: Secondary | ICD-10-CM | POA: Diagnosis not present

## 2018-01-23 DIAGNOSIS — F419 Anxiety disorder, unspecified: Secondary | ICD-10-CM | POA: Diagnosis not present

## 2018-01-23 DIAGNOSIS — J441 Chronic obstructive pulmonary disease with (acute) exacerbation: Secondary | ICD-10-CM | POA: Diagnosis not present

## 2018-01-23 DIAGNOSIS — J449 Chronic obstructive pulmonary disease, unspecified: Secondary | ICD-10-CM | POA: Diagnosis not present

## 2018-01-23 DIAGNOSIS — E1122 Type 2 diabetes mellitus with diabetic chronic kidney disease: Secondary | ICD-10-CM | POA: Diagnosis not present

## 2018-01-23 DIAGNOSIS — J9611 Chronic respiratory failure with hypoxia: Secondary | ICD-10-CM | POA: Diagnosis not present

## 2018-01-23 DIAGNOSIS — B20 Human immunodeficiency virus [HIV] disease: Secondary | ICD-10-CM | POA: Diagnosis not present

## 2018-01-23 DIAGNOSIS — I129 Hypertensive chronic kidney disease with stage 1 through stage 4 chronic kidney disease, or unspecified chronic kidney disease: Secondary | ICD-10-CM | POA: Diagnosis not present

## 2018-01-23 DIAGNOSIS — N184 Chronic kidney disease, stage 4 (severe): Secondary | ICD-10-CM | POA: Diagnosis not present

## 2018-01-24 ENCOUNTER — Encounter: Payer: Self-pay | Admitting: Endocrinology

## 2018-01-24 ENCOUNTER — Ambulatory Visit (INDEPENDENT_AMBULATORY_CARE_PROVIDER_SITE_OTHER): Payer: Medicare HMO | Admitting: Endocrinology

## 2018-01-24 VITALS — BP 120/74 | HR 98 | Ht 70.0 in | Wt 246.0 lb

## 2018-01-24 DIAGNOSIS — E1142 Type 2 diabetes mellitus with diabetic polyneuropathy: Secondary | ICD-10-CM | POA: Diagnosis not present

## 2018-01-24 DIAGNOSIS — Z89512 Acquired absence of left leg below knee: Secondary | ICD-10-CM

## 2018-01-24 DIAGNOSIS — E1165 Type 2 diabetes mellitus with hyperglycemia: Secondary | ICD-10-CM

## 2018-01-24 DIAGNOSIS — Z794 Long term (current) use of insulin: Secondary | ICD-10-CM

## 2018-01-24 DIAGNOSIS — J449 Chronic obstructive pulmonary disease, unspecified: Secondary | ICD-10-CM | POA: Diagnosis not present

## 2018-01-24 NOTE — Patient Instructions (Signed)
TOUJEO 60 IN AM AND 50 AT DINNER  Humulin R DIAL 9 CLICKS IN AM AND 6 BEFORE DINNER   Skip Pie or take 2-3 clicks

## 2018-01-24 NOTE — Progress Notes (Addendum)
Patient ID: [name], female   DOB: 12/08/1965, 53 y.o.   MRN: [id number]           Reason for Appointment: Follow-up  for Type 2 Diabetes   History of Present Illness:          Date of diagnosis of type 2 diabetes mellitus: 2000       Background history:     The patient was apparently diagnosed incidentally with her diabetes 16 years ago 16 years ago, was asymptomatic. She took metformin and possibly other oral hypoglycemic drugs but subsequently went on insulin in 2005 She was taking various insulin regimens in the past  She was being treated by an endocrinologist some time ago and he switched her To Toujeo and also tried her on Byetta.  She thinks that she had better blood sugars with Byetta but she did not follow-up  Novolog was switched to U-500 insulin in 3/17   Recent history:   INSULIN regimen is:  Toujeo 60 units bid  Humulin R U-500: 40 units around 10-11 am, 30 units before lunch   Non-insulin hypoglycemic drugs the patient is taking are: Victoza 1.21m    Her A1c has been persistently high and now 8.6 She has not been seen in follow-up since 8/19  Current management, blood sugar patterns and problems identified:  She did not bring her monitor for download  Also not clear how often or when she is checking her sugar  Her lab glucose was 291 but she may have forgotten her insulin that day  Because of visual difficulties she is not able to see the markings on her Humulin KwikPen and is going by the number of clicks to decide on the dosage  Also probably had higher readings when she had steroids after her recent admission for pneumonia  She was told to reduce her TOUJEO down to 50 units in the evening but is still taking 60  With this her FASTING reading may occasionally be in the 60s  Because of tendency to low sugars early morning she is eating a dessert late at night instead of adjusting her insulin  Not clear what her readings are at bedtime  She  thinks she is taking her Victoza regularly  Generally eating 2 meals a day with the first meal late morning and may not be eating much in the evening  Again has difficulty losing weight    Side effects from medications have been: None  Compliance with the medical regimen:  fair  Glucose monitoring:  done about 1-2 times a day       Glucometer: Accu-Chek    Blood Glucose readings from recall:   PRE-MEAL Fasting Lunch Dinner Bedtime Overall  Glucose range: 62-?  160?    Mean/median:        POST-MEAL PC Breakfast PC Lunch PC Dinner  Glucose range:   160?  Mean/median:       PREVIOUSLY  PRE-MEAL Fasting Lunch Dinner  overnight  Overall  Glucose range:  48-275   92-237   Mean/median:  139    179  152   POST-MEAL PC Breakfast PC Lunch PC Dinner  Glucose range:   99-381   Mean/median:   164      Self-care:   Typical meal intake: Breakfast is mostly a yogurt, fruit; or egg/toast  First meal at about 11 AM, has mixed meal at dinnertime 6 pm, usually 2 meals a day Has variable amounts of snacks.  Dietician visit, most recent: At [facility]               Exercise:  none  Weight history:  Wt Readings from Last 3 Encounters:  01/24/18 246 lb (111.6 kg)  01/02/18 236 lb (107 kg)  01/01/18 232 lb 2.3 oz (105.3 kg)    Glycemic control: Baseline A1c is 11 in 8/16   Lab Results  Component Value Date   HGBA1C 8.6 (H) 01/21/2018   HGBA1C 7.2 (H) 12/29/2017   HGBA1C 8.3 (H) 07/29/2017   Lab Results  Component Value Date   MICROALBUR 1.2 03/05/2016   LDLCALC 147 (H) 05/21/2017   CREATININE 1.36 (H) 01/21/2018   Lab on 01/21/2018  Component Date Value Ref Range Status  . Cholesterol 01/21/2018 161  0 - 200 mg/dL Final   ATP III Classification       Desirable:  < 200 mg/dL               Borderline High:  200 - 239 mg/dL          High:  > = 240 mg/dL  . Triglycerides 01/21/2018 234.0* 0.0 - 149.0 mg/dL Final   Normal:  <150 mg/dLBorderline High:  150 -  199 mg/dL  . HDL 01/21/2018 47.60  >39.00 mg/dL Final  . VLDL 01/21/2018 46.8* 0.0 - 40.0 mg/dL Final  . Total CHOL/HDL Ratio 01/21/2018 3   Final                  Men          Women1/2 Average Risk     3.4          3.3Average Risk          5.0          4.42X Average Risk          9.6          7.13X Average Risk          15.0          11.0                      . NonHDL 01/21/2018 113.01   Final   NOTE:  Non-HDL goal should be 30 mg/dL higher than patient's LDL goal (i.e. LDL goal of < 70 mg/dL, would have non-HDL goal of < 100 mg/dL)  . Sodium 01/21/2018 135  135 - 145 mEq/L Final  . Potassium 01/21/2018 4.9  3.5 - 5.1 mEq/L Final  . Chloride 01/21/2018 97  96 - 112 mEq/L Final  . CO2 01/21/2018 30  19 - 32 mEq/L Final  . Glucose, Bld 01/21/2018 291* 70 - 99 mg/dL Final  . BUN 01/21/2018 36* 6 - 23 mg/dL Final  . Creatinine, Ser 01/21/2018 1.36* 0.40 - 1.20 mg/dL Final  . Calcium 01/21/2018 9.6  8.4 - 10.5 mg/dL Final  . GFR 01/21/2018 40.55* >60.00 mL/min Final  . Hgb A1c MFr Bld 01/21/2018 8.6* 4.6 - 6.5 % Final   Glycemic Control Guidelines for People with Diabetes:Non Diabetic:  <6%Goal of Therapy: <7%Additional Action Suggested:  >8%   . Direct LDL 01/21/2018 91.0  mg/dL Final   Optimal:  <100 mg/dLNear or Above Optimal:  100-129 mg/dLBorderline High:  130-159 mg/dLHigh:  160-189 mg/dLVery High:  >190 mg/dL    Other active problems: See review of systems    Allergies as of 01/24/2018      Reactions   Cortizone-10 [hydrocortisone] Rash  Per patient, given local injection at knee and developed rash at local site.       Medication List       Accurate as of January 24, 2018 11:59 PM. Always use your most recent med list.        albuterol 108 (90 Base) MCG/ACT inhaler Commonly known as:  PROVENTIL HFA;VENTOLIN HFA INHALE 2 PUFFS INTO THE LUNGS EVERY 6 HOURS AS NEEDED FOR WHEEZING OR SHORTNESS OF BREATH   diphenoxylate-atropine 2.5-0.025 MG tablet Commonly known as:   LOMOTIL Take 1 tablet by mouth 4 (four) times daily as needed for diarrhea or loose stools.   fenofibrate 145 MG tablet Commonly known as:  TRICOR Take 1 tablet (145 mg total) by mouth daily.   fluticasone furoate-vilanterol 100-25 MCG/INH Aepb Commonly known as:  BREO ELLIPTA INHALE 1 PUFF INTO THE LUNGS DAILY   GENVOYA 150-150-200-10 MG Tabs tablet Generic drug:  elvitegravir-cobicistat-emtricitabine-tenofovir TAKE 1 TABLET BY MOUTH DAILY   glucose blood test strip Commonly known as:  ACCU-CHEK AVIVA Use as instructed to check blood sugar 2 times daily.   ipratropium-albuterol 0.5-2.5 (3) MG/3ML Soln Commonly known as:  DUONEB Take 3 mLs by nebulization every 6 (six) hours.   liraglutide 18 MG/3ML Sopn Commonly known as:  VICTOZA ADMINISTER 1.8 MG UNDER THE SKIN DAILY   MAGNESIUM-OXIDE 400 (241.3 Mg) MG tablet Generic drug:  magnesium oxide Take 400 mg by mouth daily.   pantoprazole 40 MG tablet Commonly known as:  PROTONIX Take 1 tablet (40 mg total) by mouth daily.   pregabalin 225 MG capsule Commonly known as:  LYRICA Take 225 mg by mouth 2 (two) times daily. TAKE 1 CAPSULE (225MG) BY MOUTH TWICE DAILY.   rosuvastatin 20 MG tablet Commonly known as:  CRESTOR Take 1 tablet (20 mg total) by mouth daily. Reported on 05/02/2015   TOUJEO MAX SOLOSTAR 300 UNIT/ML Sopn Generic drug:  Insulin Glargine (2 Unit Dial) Inject 60 Units into the skin 2 (two) times daily. TAKE 60 UNITS UNDER THE SKIN TWICE DAILY.       Allergies:  Allergies  Allergen Reactions  . Cortizone-10 [Hydrocortisone] Rash    Per patient, given local injection at knee and developed rash at local site.     Past Medical History:  Diagnosis Date  . Acute metabolic encephalopathy 9/93/7169  . Acute on chronic respiratory failure with hypoxia ([place]) 05/19/2016  . Anxiety   . ARF (acute renal failure) ([place]) 05/03/2014  . Arthritis    LEFT HIP  . Asthma    HOSPITALIZED WITH EXCERBATION OF ASTHMA -  AND BRONCHITIS AND THE FLU DEC 2013  . Asthma exacerbation attacks 12/28/2011  . Bronchitis 12/28/2011  . CAP (community acquired pneumonia) 07/25/2012  . Chronic kidney disease (CKD), stage IV (severe) ([place]) 11/10/2014  . Class 3 obesity due to excess calories with serious comorbidity and body mass index (BMI) of 40.0 to 44.9 in adult   . Colles' fracture of left radius   . COPD (chronic obstructive pulmonary disease) ([place])   . Depression   . Diabetes mellitus    ON INSULIN AND ORAL MEDICATIONS  . Diabetes mellitus type 2 with complications, uncontrolled ([place]) 04/19/2008   Qualifier: Diagnosis of  By: Tomma Lightning MD, Claiborne Billings     . Diabetic foot infection ([place]) 11/09/2014  . Diabetic ketoacidosis without coma associated with type 2 diabetes mellitus ([place])   . Diabetic neuropathy ([place]) 11/10/2014  . Diarrhea 08/30/2008   Qualifier: Diagnosis of  By: Tomma Lightning MD, Claiborne Billings    .  Diastolic dysfunction 59/05/6385  . Difficult intravenous access   . DKA, type 2 ([place]) 04/04/2016  . Dyslipidemia 12/23/2008   Qualifier: Diagnosis of  By: Tomma Lightning MD, Claiborne Billings    . Essential hypertension 04/19/2008   Qualifier: Diagnosis of  By: Tomma Lightning MD, Claiborne Billings    . FATIGUE 07/12/2008   Qualifier: Diagnosis of  By: Tomma Lightning MD, Claiborne Billings    . Femoral neck fracture ([place]) 07/16/2011  . Femur fracture, left ([place]) 07/08/2014  . Fever   . Fracture of distal femur ([place]) 07/08/2014  . GERD (gastroesophageal reflux disease)   . Hereditary and idiopathic peripheral neuropathy 04/19/2008   Qualifier: Diagnosis of  By: Tomma Lightning MD, Claiborne Billings    . Hip fracture, left ([place]) 07/16/2011  . Hip pain   . HIV infection ([place]) 2000  . Human immunodeficiency virus (HIV) disease ([place]) 04/19/2008   [id number] +   . Hyperlipidemia   . Hypertension   . Hyponatremia 12/29/2011  . Influenza A 12/29/2011  . Influenza B   . Insomnia 06/12/2016  . Left hip pain 05/03/2014  . Mood disorder ([place]) 04/19/2008   Qualifier: Diagnosis of  By: Tomma Lightning MD, Claiborne Billings    . Neuropathy      NEUROPATHY HANDS AND FEET  . NSTEMI (non-ST elevated myocardial infarction) ([place]) 05/19/2016  . Obesity hypoventilation syndrome ([place])   . Osteoporosis 07/08/2014  . Pain    SEVERE PAIN LEFT HIP - HX OF LEFT HIP PINNING JULY 2013  . Perimenopausal symptoms 02/13/2012   LMP around 2011. On estrace and provera since around 2012 for hot flashes, mood swings. Estrace 2 mg daily, Provera 2.5 mg for 5 days each month.   . Repeated falls 07/30/2011  . Sepsis ([place]) 04/04/2016  . Shortness of breath    ALLERGIES ARE "ACTING UP"  . SOB (shortness of breath)   . THRUSH 05/20/2008   Qualifier: Diagnosis of  By: Tomma Lightning MD, Claiborne Billings      . Tobacco use disorder 04/14/2010    Past Surgical History:  Procedure Laterality Date  . AMPUTATION Left 11/18/2014   Procedure: LEFT BELOW KNEE AMPUTATION;  Surgeon: Leandrew Koyanagi, MD;  Location: [place];  Service: Orthopedics;  Laterality: Left;  . CAST APPLICATION Left 05/07/4330   Procedure: CAST APPLICATION (FIBERGLASS);  Surgeon: Latanya Maudlin, MD;  Location: [facility];  Service: Orthopedics;  Laterality: Left;  CLOSED REDUCTION OF LEFT COLLES FRACTURE WITH SHORT ARM CAST  . HARDWARE REMOVAL Left 05/05/2014   Procedure: HARDWARE REMOVAL LEFT HIP;  Surgeon: Latanya Maudlin, MD;  Location: [facility];  Service: Orthopedics;  Laterality: Left;  REMOVAL BIOMET 6.5-8.0 CANNULATED SCREW  . HIP ARTHROPLASTY Left 05/05/2014   Procedure: ARTHROPLASTY OPEN REDUCTION INTERNAL FIXATION LEFT HIP AND REMOVAL OF TWO CANNULATED SCREW;  Surgeon: Latanya Maudlin, MD;  Location: [facility];  Service: Orthopedics;  Laterality: Left;  . HIP PINNING,CANNULATED  07/16/2011   Procedure: CANNULATED HIP PINNING;  Surgeon: Gearlean Alf, MD;  Location: [facility];  Service: Orthopedics;  Laterality: Left;  . HIP PINNING,CANNULATED Left 04/09/2012   Procedure: CANNULATED HIP PINNING AND HARDWARE REVISION;  Surgeon: Gearlean Alf, MD;  Location: [facility];  Service: Orthopedics;  Laterality: Left;  . I&D EXTREMITY Left  11/16/2014   Procedure:  DEBRIDEMENT OF LEFT FOOT POSSIBLE APPLICATION OF INTEGRIA AND VAC ;  Surgeon: Irene Limbo, MD;  Location: [facility];  Service: Plastics;  Laterality: Left;  . PERIPHERAL VASCULAR CATHETERIZATION N/A 11/15/2014   Procedure: Abdominal Aortogram;  Surgeon: Conrad [place], MD;  Location: [place] [facility];  Service: Cardiovascular;  Laterality: N/A;  . PERIPHERAL VASCULAR CATHETERIZATION  11/15/2014   Procedure: Lower Extremity Angiography;  Surgeon: Conrad [place], MD;  Location: [place] [facility];  Service: Cardiovascular;;  . PERIPHERAL VASCULAR CATHETERIZATION Left 11/15/2014   Procedure: Peripheral Vascular Intervention;  Surgeon: Conrad [place], MD;  Location: [place] [facility];  Service: Cardiovascular;  Laterality: Left;  popliteal artery stenting    Family History  Problem Relation Age of Onset  . Diabetes Mother   . Hypertension Mother   . Vision loss Mother   . Heart disease Mother   . Hypertension Father   . Thyroid disease Neg Hx     Social History:  reports that she has been smoking e-cigarettes and cigarettes. She has a 17.50 pack-year smoking history. She has never used smokeless tobacco. She reports that she does not drink alcohol or use drugs.    Review of Systems   RENAL dysfunction: Near normal recently  Lab Results  Component Value Date   CREATININE 1.36 (H) 01/21/2018   BUN 36 (H) 01/21/2018   NA 135 01/21/2018   K 4.9 01/21/2018   CL 97 01/21/2018   CO2 30 01/21/2018     Lipid history: Has been treated with  Fenofibrate and Crestor by PCP LDL is improved    Lab Results  Component Value Date   CHOL 161 01/21/2018   HDL 47.60 01/21/2018   LDLCALC 147 (H) 05/21/2017   LDLDIRECT 91.0 01/21/2018   TRIG 234.0 (H) 01/21/2018   CHOLHDL 3 01/21/2018           She takes Lyrica for phantom pain  Blood pressure has been variable, currently not on specific treatment or ACE inhibitor  BP Readings from Last 3 Encounters:  01/24/18  120/74  01/05/18 118/88  01/03/18 105/62     Physical Examination:  BP 120/74 (BP Location: Right Arm, Patient Position: Sitting, Cuff Size: Large)   Pulse 98   Ht 5' 10"  (1.778 m)   Wt 246 lb (111.6 kg)   LMP 11/16/2010   SpO2 98%   BMI 35.30 kg/m       ASSESSMENT:  Diabetes type 2, uncontrolled    See history of present illness for detailed discussion of current diabetes management, blood sugar patterns and problems identified   A1c is 8.6  She is on a regimen of Toujeo twice daily and Humulin R U-500 insulin twice a day and Victoza Her diabetes management, blood sugar patterns and insulin regimen was reviewed in detail Discussed persistently high A1c readings  A1c is persistently high over 8% in the office She is likely not compliant with checking her sugar, taking her Humulin R on time with some missed doses Also with her blood sugar not being reviewed on her home monitor not clear when and how often she is checking her sugar Highest glucose recently 291 but she thinks her blood sugars are not as high, this was after a missed insulin dose  With recent steroids her blood sugar had been higher but reportedly back to normal especially fasting She forgets to change her insulins as directed Has not reduced her evening Toujeo as directed and still may have low normal readings fasting  As before her diet can be variable and she does not adjust her mealtime dose based on what she is eating    PLAN:   She will need to check her sugars regularly including after meals Needs to bring her  monitor for download on the next visit Since her blood sugars are likely higher after her first meal she will take at least 45 units of Humulin in the morning using 9 Clicks She will reduce her evening TOUJEO down to 50 for now and if fasting readings are still high reduce another 5 minutes She may have to take a little larger dose of insulin for eating out in the evening Continue Victoza She  will stop eating desserts late at night and if sugars are lower in the morning reduce the insulin instead If she does have a small dessert at night she will likely at least take 10 to 15 units Humulin U-500 More regular follow-up Consider follow-up review with diabetes educator or dietitian  Consider ACE inhibitor or ARB drug for her renal protection and variable blood pressures  Counseling time on subjects discussed in assessment and plan sections is over 50% of today's 25 minute visit   Patient Instructions   TOUJEO 60 IN AM AND 50 AT DINNER  Humulin R DIAL 9 CLICKS IN AM AND 6 BEFORE DINNER   Skip Pie or take 2-3 clicks     Elayne Snare 01/26/2018, 3:09 PM   Note: This office note was prepared with Dragon voice recognition system technology. Any transcriptional errors that result from this process are unintentional.

## 2018-01-27 DIAGNOSIS — N184 Chronic kidney disease, stage 4 (severe): Secondary | ICD-10-CM | POA: Diagnosis not present

## 2018-01-27 DIAGNOSIS — E1122 Type 2 diabetes mellitus with diabetic chronic kidney disease: Secondary | ICD-10-CM | POA: Diagnosis not present

## 2018-01-27 DIAGNOSIS — B20 Human immunodeficiency virus [HIV] disease: Secondary | ICD-10-CM | POA: Diagnosis not present

## 2018-01-27 DIAGNOSIS — M1612 Unilateral primary osteoarthritis, left hip: Secondary | ICD-10-CM | POA: Diagnosis not present

## 2018-01-27 DIAGNOSIS — J9611 Chronic respiratory failure with hypoxia: Secondary | ICD-10-CM | POA: Diagnosis not present

## 2018-01-27 DIAGNOSIS — J449 Chronic obstructive pulmonary disease, unspecified: Secondary | ICD-10-CM | POA: Diagnosis not present

## 2018-01-27 DIAGNOSIS — J441 Chronic obstructive pulmonary disease with (acute) exacerbation: Secondary | ICD-10-CM | POA: Diagnosis not present

## 2018-01-27 DIAGNOSIS — F329 Major depressive disorder, single episode, unspecified: Secondary | ICD-10-CM | POA: Diagnosis not present

## 2018-01-27 DIAGNOSIS — I129 Hypertensive chronic kidney disease with stage 1 through stage 4 chronic kidney disease, or unspecified chronic kidney disease: Secondary | ICD-10-CM | POA: Diagnosis not present

## 2018-01-27 DIAGNOSIS — F419 Anxiety disorder, unspecified: Secondary | ICD-10-CM | POA: Diagnosis not present

## 2018-01-28 DIAGNOSIS — B20 Human immunodeficiency virus [HIV] disease: Secondary | ICD-10-CM | POA: Diagnosis not present

## 2018-01-28 DIAGNOSIS — F329 Major depressive disorder, single episode, unspecified: Secondary | ICD-10-CM | POA: Diagnosis not present

## 2018-01-28 DIAGNOSIS — I129 Hypertensive chronic kidney disease with stage 1 through stage 4 chronic kidney disease, or unspecified chronic kidney disease: Secondary | ICD-10-CM | POA: Diagnosis not present

## 2018-01-28 DIAGNOSIS — J449 Chronic obstructive pulmonary disease, unspecified: Secondary | ICD-10-CM | POA: Diagnosis not present

## 2018-01-28 DIAGNOSIS — J441 Chronic obstructive pulmonary disease with (acute) exacerbation: Secondary | ICD-10-CM | POA: Diagnosis not present

## 2018-01-28 DIAGNOSIS — E1122 Type 2 diabetes mellitus with diabetic chronic kidney disease: Secondary | ICD-10-CM | POA: Diagnosis not present

## 2018-01-28 DIAGNOSIS — F419 Anxiety disorder, unspecified: Secondary | ICD-10-CM | POA: Diagnosis not present

## 2018-01-28 DIAGNOSIS — M1612 Unilateral primary osteoarthritis, left hip: Secondary | ICD-10-CM | POA: Diagnosis not present

## 2018-01-28 DIAGNOSIS — J9611 Chronic respiratory failure with hypoxia: Secondary | ICD-10-CM | POA: Diagnosis not present

## 2018-01-28 DIAGNOSIS — N184 Chronic kidney disease, stage 4 (severe): Secondary | ICD-10-CM | POA: Diagnosis not present

## 2018-01-29 DIAGNOSIS — J441 Chronic obstructive pulmonary disease with (acute) exacerbation: Secondary | ICD-10-CM | POA: Diagnosis not present

## 2018-01-29 DIAGNOSIS — B20 Human immunodeficiency virus [HIV] disease: Secondary | ICD-10-CM | POA: Diagnosis not present

## 2018-01-29 DIAGNOSIS — I129 Hypertensive chronic kidney disease with stage 1 through stage 4 chronic kidney disease, or unspecified chronic kidney disease: Secondary | ICD-10-CM | POA: Diagnosis not present

## 2018-01-29 DIAGNOSIS — F329 Major depressive disorder, single episode, unspecified: Secondary | ICD-10-CM | POA: Diagnosis not present

## 2018-01-29 DIAGNOSIS — J9611 Chronic respiratory failure with hypoxia: Secondary | ICD-10-CM | POA: Diagnosis not present

## 2018-01-29 DIAGNOSIS — F419 Anxiety disorder, unspecified: Secondary | ICD-10-CM | POA: Diagnosis not present

## 2018-01-29 DIAGNOSIS — N184 Chronic kidney disease, stage 4 (severe): Secondary | ICD-10-CM | POA: Diagnosis not present

## 2018-01-29 DIAGNOSIS — M1612 Unilateral primary osteoarthritis, left hip: Secondary | ICD-10-CM | POA: Diagnosis not present

## 2018-01-29 DIAGNOSIS — E1122 Type 2 diabetes mellitus with diabetic chronic kidney disease: Secondary | ICD-10-CM | POA: Diagnosis not present

## 2018-01-30 DIAGNOSIS — F329 Major depressive disorder, single episode, unspecified: Secondary | ICD-10-CM | POA: Diagnosis not present

## 2018-01-30 DIAGNOSIS — F419 Anxiety disorder, unspecified: Secondary | ICD-10-CM | POA: Diagnosis not present

## 2018-01-30 DIAGNOSIS — J9611 Chronic respiratory failure with hypoxia: Secondary | ICD-10-CM | POA: Diagnosis not present

## 2018-01-30 DIAGNOSIS — M1612 Unilateral primary osteoarthritis, left hip: Secondary | ICD-10-CM | POA: Diagnosis not present

## 2018-01-30 DIAGNOSIS — B20 Human immunodeficiency virus [HIV] disease: Secondary | ICD-10-CM | POA: Diagnosis not present

## 2018-01-30 DIAGNOSIS — J449 Chronic obstructive pulmonary disease, unspecified: Secondary | ICD-10-CM | POA: Diagnosis not present

## 2018-01-30 DIAGNOSIS — J441 Chronic obstructive pulmonary disease with (acute) exacerbation: Secondary | ICD-10-CM | POA: Diagnosis not present

## 2018-01-30 DIAGNOSIS — N184 Chronic kidney disease, stage 4 (severe): Secondary | ICD-10-CM | POA: Diagnosis not present

## 2018-01-30 DIAGNOSIS — E1122 Type 2 diabetes mellitus with diabetic chronic kidney disease: Secondary | ICD-10-CM | POA: Diagnosis not present

## 2018-01-30 DIAGNOSIS — I129 Hypertensive chronic kidney disease with stage 1 through stage 4 chronic kidney disease, or unspecified chronic kidney disease: Secondary | ICD-10-CM | POA: Diagnosis not present

## 2018-01-31 DIAGNOSIS — B20 Human immunodeficiency virus [HIV] disease: Secondary | ICD-10-CM | POA: Diagnosis not present

## 2018-01-31 DIAGNOSIS — F419 Anxiety disorder, unspecified: Secondary | ICD-10-CM | POA: Diagnosis not present

## 2018-01-31 DIAGNOSIS — N184 Chronic kidney disease, stage 4 (severe): Secondary | ICD-10-CM | POA: Diagnosis not present

## 2018-01-31 DIAGNOSIS — J449 Chronic obstructive pulmonary disease, unspecified: Secondary | ICD-10-CM | POA: Diagnosis not present

## 2018-01-31 DIAGNOSIS — F329 Major depressive disorder, single episode, unspecified: Secondary | ICD-10-CM | POA: Diagnosis not present

## 2018-01-31 DIAGNOSIS — E1122 Type 2 diabetes mellitus with diabetic chronic kidney disease: Secondary | ICD-10-CM | POA: Diagnosis not present

## 2018-01-31 DIAGNOSIS — M1612 Unilateral primary osteoarthritis, left hip: Secondary | ICD-10-CM | POA: Diagnosis not present

## 2018-01-31 DIAGNOSIS — I129 Hypertensive chronic kidney disease with stage 1 through stage 4 chronic kidney disease, or unspecified chronic kidney disease: Secondary | ICD-10-CM | POA: Diagnosis not present

## 2018-01-31 DIAGNOSIS — J441 Chronic obstructive pulmonary disease with (acute) exacerbation: Secondary | ICD-10-CM | POA: Diagnosis not present

## 2018-01-31 DIAGNOSIS — J9611 Chronic respiratory failure with hypoxia: Secondary | ICD-10-CM | POA: Diagnosis not present

## 2018-02-03 ENCOUNTER — Other Ambulatory Visit: Payer: Self-pay | Admitting: Infectious Diseases

## 2018-02-03 DIAGNOSIS — J449 Chronic obstructive pulmonary disease, unspecified: Secondary | ICD-10-CM | POA: Diagnosis not present

## 2018-02-04 DIAGNOSIS — M6281 Muscle weakness (generalized): Secondary | ICD-10-CM | POA: Diagnosis not present

## 2018-02-04 DIAGNOSIS — I5032 Chronic diastolic (congestive) heart failure: Secondary | ICD-10-CM | POA: Diagnosis not present

## 2018-02-04 DIAGNOSIS — H5789 Other specified disorders of eye and adnexa: Secondary | ICD-10-CM | POA: Diagnosis not present

## 2018-02-04 DIAGNOSIS — J449 Chronic obstructive pulmonary disease, unspecified: Secondary | ICD-10-CM | POA: Diagnosis not present

## 2018-02-04 DIAGNOSIS — J45909 Unspecified asthma, uncomplicated: Secondary | ICD-10-CM | POA: Diagnosis not present

## 2018-02-04 DIAGNOSIS — M545 Low back pain: Secondary | ICD-10-CM | POA: Diagnosis not present

## 2018-02-04 DIAGNOSIS — N184 Chronic kidney disease, stage 4 (severe): Secondary | ICD-10-CM | POA: Diagnosis not present

## 2018-02-04 DIAGNOSIS — R269 Unspecified abnormalities of gait and mobility: Secondary | ICD-10-CM | POA: Diagnosis not present

## 2018-02-04 DIAGNOSIS — S72002D Fracture of unspecified part of neck of left femur, subsequent encounter for closed fracture with routine healing: Secondary | ICD-10-CM | POA: Diagnosis not present

## 2018-02-04 DIAGNOSIS — R488 Other symbolic dysfunctions: Secondary | ICD-10-CM | POA: Diagnosis not present

## 2018-02-04 DIAGNOSIS — F17200 Nicotine dependence, unspecified, uncomplicated: Secondary | ICD-10-CM | POA: Diagnosis not present

## 2018-02-05 DIAGNOSIS — F1120 Opioid dependence, uncomplicated: Secondary | ICD-10-CM | POA: Diagnosis not present

## 2018-02-05 DIAGNOSIS — M5136 Other intervertebral disc degeneration, lumbar region: Secondary | ICD-10-CM | POA: Diagnosis not present

## 2018-02-05 DIAGNOSIS — G894 Chronic pain syndrome: Secondary | ICD-10-CM | POA: Diagnosis not present

## 2018-02-05 DIAGNOSIS — Z79899 Other long term (current) drug therapy: Secondary | ICD-10-CM | POA: Diagnosis not present

## 2018-02-06 DIAGNOSIS — J449 Chronic obstructive pulmonary disease, unspecified: Secondary | ICD-10-CM | POA: Diagnosis not present

## 2018-02-07 DIAGNOSIS — J449 Chronic obstructive pulmonary disease, unspecified: Secondary | ICD-10-CM | POA: Diagnosis not present

## 2018-02-10 DIAGNOSIS — J449 Chronic obstructive pulmonary disease, unspecified: Secondary | ICD-10-CM | POA: Diagnosis not present

## 2018-02-11 DIAGNOSIS — J449 Chronic obstructive pulmonary disease, unspecified: Secondary | ICD-10-CM | POA: Diagnosis not present

## 2018-02-12 DIAGNOSIS — B20 Human immunodeficiency virus [HIV] disease: Secondary | ICD-10-CM | POA: Diagnosis not present

## 2018-02-12 DIAGNOSIS — J9611 Chronic respiratory failure with hypoxia: Secondary | ICD-10-CM | POA: Diagnosis not present

## 2018-02-12 DIAGNOSIS — J441 Chronic obstructive pulmonary disease with (acute) exacerbation: Secondary | ICD-10-CM | POA: Diagnosis not present

## 2018-02-12 DIAGNOSIS — M1612 Unilateral primary osteoarthritis, left hip: Secondary | ICD-10-CM | POA: Diagnosis not present

## 2018-02-12 DIAGNOSIS — F329 Major depressive disorder, single episode, unspecified: Secondary | ICD-10-CM | POA: Diagnosis not present

## 2018-02-12 DIAGNOSIS — F419 Anxiety disorder, unspecified: Secondary | ICD-10-CM | POA: Diagnosis not present

## 2018-02-12 DIAGNOSIS — I129 Hypertensive chronic kidney disease with stage 1 through stage 4 chronic kidney disease, or unspecified chronic kidney disease: Secondary | ICD-10-CM | POA: Diagnosis not present

## 2018-02-12 DIAGNOSIS — N184 Chronic kidney disease, stage 4 (severe): Secondary | ICD-10-CM | POA: Diagnosis not present

## 2018-02-12 DIAGNOSIS — E1122 Type 2 diabetes mellitus with diabetic chronic kidney disease: Secondary | ICD-10-CM | POA: Diagnosis not present

## 2018-02-13 DIAGNOSIS — I129 Hypertensive chronic kidney disease with stage 1 through stage 4 chronic kidney disease, or unspecified chronic kidney disease: Secondary | ICD-10-CM | POA: Diagnosis not present

## 2018-02-13 DIAGNOSIS — J441 Chronic obstructive pulmonary disease with (acute) exacerbation: Secondary | ICD-10-CM | POA: Diagnosis not present

## 2018-02-13 DIAGNOSIS — N184 Chronic kidney disease, stage 4 (severe): Secondary | ICD-10-CM | POA: Diagnosis not present

## 2018-02-13 DIAGNOSIS — F329 Major depressive disorder, single episode, unspecified: Secondary | ICD-10-CM | POA: Diagnosis not present

## 2018-02-13 DIAGNOSIS — B20 Human immunodeficiency virus [HIV] disease: Secondary | ICD-10-CM | POA: Diagnosis not present

## 2018-02-13 DIAGNOSIS — F419 Anxiety disorder, unspecified: Secondary | ICD-10-CM | POA: Diagnosis not present

## 2018-02-13 DIAGNOSIS — M1612 Unilateral primary osteoarthritis, left hip: Secondary | ICD-10-CM | POA: Diagnosis not present

## 2018-02-13 DIAGNOSIS — J9611 Chronic respiratory failure with hypoxia: Secondary | ICD-10-CM | POA: Diagnosis not present

## 2018-02-13 DIAGNOSIS — E1122 Type 2 diabetes mellitus with diabetic chronic kidney disease: Secondary | ICD-10-CM | POA: Diagnosis not present

## 2018-02-13 DIAGNOSIS — J449 Chronic obstructive pulmonary disease, unspecified: Secondary | ICD-10-CM | POA: Diagnosis not present

## 2018-02-14 DIAGNOSIS — J449 Chronic obstructive pulmonary disease, unspecified: Secondary | ICD-10-CM | POA: Diagnosis not present

## 2018-02-18 ENCOUNTER — Other Ambulatory Visit: Payer: Self-pay | Admitting: Endocrinology

## 2018-02-18 DIAGNOSIS — J449 Chronic obstructive pulmonary disease, unspecified: Secondary | ICD-10-CM | POA: Diagnosis not present

## 2018-02-20 DIAGNOSIS — J449 Chronic obstructive pulmonary disease, unspecified: Secondary | ICD-10-CM | POA: Diagnosis not present

## 2018-02-21 DIAGNOSIS — J449 Chronic obstructive pulmonary disease, unspecified: Secondary | ICD-10-CM | POA: Diagnosis not present

## 2018-02-24 DIAGNOSIS — J449 Chronic obstructive pulmonary disease, unspecified: Secondary | ICD-10-CM | POA: Diagnosis not present

## 2018-02-25 DIAGNOSIS — J449 Chronic obstructive pulmonary disease, unspecified: Secondary | ICD-10-CM | POA: Diagnosis not present

## 2018-02-27 DIAGNOSIS — J449 Chronic obstructive pulmonary disease, unspecified: Secondary | ICD-10-CM | POA: Diagnosis not present

## 2018-03-01 ENCOUNTER — Other Ambulatory Visit: Payer: Self-pay | Admitting: Infectious Diseases

## 2018-03-04 DIAGNOSIS — F1120 Opioid dependence, uncomplicated: Secondary | ICD-10-CM | POA: Diagnosis not present

## 2018-03-04 DIAGNOSIS — G894 Chronic pain syndrome: Secondary | ICD-10-CM | POA: Diagnosis not present

## 2018-03-04 DIAGNOSIS — M5136 Other intervertebral disc degeneration, lumbar region: Secondary | ICD-10-CM | POA: Diagnosis not present

## 2018-03-04 DIAGNOSIS — G546 Phantom limb syndrome with pain: Secondary | ICD-10-CM | POA: Diagnosis not present

## 2018-03-04 DIAGNOSIS — Z79899 Other long term (current) drug therapy: Secondary | ICD-10-CM | POA: Diagnosis not present

## 2018-03-07 ENCOUNTER — Other Ambulatory Visit: Payer: Self-pay | Admitting: *Deleted

## 2018-03-07 NOTE — Patient Outreach (Signed)
[facility]) Care Management  03/07/2018  INDY PRESTWOOD 1965/10/20 [id number]   Referral received from care management assistant via [place] as their services were complete on 3/3 and it has been requested for [facility] to follow for chronic disease management.  Per chart, she has history of hypertension, NSTEMI, COPD, diabetes, and hyperlipidemia.   Call placed to member for telephone assessment, no answer.  HIPAA compliant voice message left.  Unsuccessful outreach letter sent, will follow up within the next 4 business days.  Valente David, [place], MSN [place] [phone]

## 2018-03-10 ENCOUNTER — Other Ambulatory Visit: Payer: Self-pay | Admitting: Endocrinology

## 2018-03-11 ENCOUNTER — Other Ambulatory Visit: Payer: Self-pay | Admitting: Endocrinology

## 2018-03-11 ENCOUNTER — Ambulatory Visit (INDEPENDENT_AMBULATORY_CARE_PROVIDER_SITE_OTHER): Payer: Medicare HMO | Admitting: Pulmonary Disease

## 2018-03-11 ENCOUNTER — Ambulatory Visit: Payer: Self-pay | Admitting: Endocrinology

## 2018-03-11 ENCOUNTER — Encounter: Payer: Self-pay | Admitting: Pulmonary Disease

## 2018-03-11 VITALS — BP 130/68 | HR 101 | Ht 69.0 in | Wt 243.4 lb

## 2018-03-11 DIAGNOSIS — J411 Mucopurulent chronic bronchitis: Secondary | ICD-10-CM | POA: Diagnosis not present

## 2018-03-11 DIAGNOSIS — F17210 Nicotine dependence, cigarettes, uncomplicated: Secondary | ICD-10-CM

## 2018-03-11 DIAGNOSIS — Z720 Tobacco use: Secondary | ICD-10-CM

## 2018-03-11 DIAGNOSIS — J309 Allergic rhinitis, unspecified: Secondary | ICD-10-CM

## 2018-03-11 MED ORDER — FLUTICASONE-UMECLIDIN-VILANT 100-62.5-25 MCG/INH IN AEPB: 1.0000 | INHALATION_SPRAY | Freq: Every day | RESPIRATORY_TRACT | 0 refills | Status: DC

## 2018-03-11 MED ORDER — FLUTICASONE FUROATE-VILANTEROL 100-25 MCG/INH IN AEPB: 1.0000 | INHALATION_SPRAY | Freq: Every day | RESPIRATORY_TRACT | 0 refills | Status: DC

## 2018-03-11 MED ORDER — MONTELUKAST SODIUM 10 MG PO TABS: 10.0000 mg | ORAL_TABLET | Freq: Every day | ORAL | 11 refills | Status: DC

## 2018-03-11 NOTE — Progress Notes (Signed)
_0  ID: [name], female    DOB: [birth year], 53 y.o.   MRN: [id number]  Chief Complaint  Patient presents with  . Follow-up    Referring provider: [name], PA-C  HPI:  53 year old female current every day smoker followed in our office for mucopurulent chronic bronchitis and suspected COPD  PMH: HIV, mood disorder, osteoporosis, obesity, hyperlipidemia, chronic kidney disease stage III, anxiety and depression Smoker/ Smoking History: Current Smoker. 0.75 ppd.  Maintenance: Breo Ellipta 100 Pt of: Dr. Halford Chessman  03/11/2018  - Visit   53 year old female current every day smoker followed in our office for mucopurulent chronic bronchitis and suspected COPD.  Patient was last seen in June/2019 in this office.  Patient was instructed at that point in time to get scheduled for pulmonary function testing which was not completed.  We do not have any spirometry or pulmonary function testing on the patient.  Since being seen in our office in January/2019 the patient has had 6 ED visits for COPD exacerbations and even more hospitalizations and emergency room visits for other chronic health issues.  Patient also reporting that she is had worsened allergies over the past couple of weeks.  Patient has visibly swollen eyes as well as reporting itchy eyes.  Patient also reports increased nasal drainage.  Patient reports that she has allergy-like symptoms every time there is a season change, she also has known allergies to cats.  Patient is not currently on any sort of daily antihistamine or Singulair.  MMRC - Breathlessness Score 3 - I stop for breath after walking about 100 yards or after a few minutes on level ground (isle at grocery store is 137f)    Tests:   Cardiac tests: Echo 05/24/16 >> Mod LVH, EF 60 to 65%, grade 2 DD  Pulmonary tests CT angio chest 05/23/16 >> peribronchial thickening ABG 05/23/16 >> pH 7.36, PCO2 50.5, PO2 67.2  03/11/2018-office spirometry- FVC 1.7  (41% predicted), ratio 67, FEV1 1.1 (35% predicted) >>> Severe obstruction  FENO:  No results found for: NITRICOXIDE  PFT: No flowsheet data found.  Imaging: No results found.    Specialty Problems      Pulmonary Problems   COPD with acute bronchitis ([name])    Qualifier: Diagnosis of  By: VTomma LightningMD, KClaiborne Billings       Asthma exacerbation attacks   Bronchitis   Obesity hypoventilation syndrome ([place]   Acute on chronic respiratory failure with hypoxia ([name])   Asthma exacerbation   Acute on chronic respiratory failure with hypoxia and hypercapnia ([name])   COPD exacerbation ([name])   Acute respiratory failure with hypoxia ([name])   Allergic rhinitis   Mucopurulent chronic bronchitis ([name])    03/11/2018-office spirometry- FVC 1.7 (41% predicted), ratio 67, FEV1 1.1 (35% predicted) >>> Severe obstruction  Recurrent exacerbations         Allergies  Allergen Reactions  . Cortizone-10 [Hydrocortisone] Rash    Per patient, given local injection at knee and developed rash at local site.     Immunization History  Administered Date(s) Administered  . Hepatitis B 07/06/1999, 08/06/1999, 11/19/2001  . Hepatitis B, adult 06/08/2013, 07/14/2013, 12/23/2013  . Hepatitis B, ped/adol 07/06/1999, 08/06/1999, 11/19/2001, 06/08/2013, 07/14/2013, 12/23/2013  . Influenza Split 10/17/2010  . Influenza Whole 11/17/2003, 10/10/2007, 11/28/2009  . Influenza, Seasonal, Injecte, Preservative Fre 01/06/2013, 10/14/2013, 12/23/2013  . Influenza,inj,Quad PF,6+ Mos 01/06/2013, 12/23/2013, 10/11/2014, 11/20/2016  . Influenza,trivalent, recombinat, inj, PF 11/01/2012  . Pneumococcal Conjugate-13 05/21/2017  . Pneumococcal Polysaccharide-23 02/20/2007, 04/22/2012  .  Tdap 05/01/2012    Past Medical History:  Diagnosis Date  . Acute metabolic encephalopathy 05/06/3974  . Acute on chronic respiratory failure with hypoxia ([place]) 05/19/2016  . Anxiety   . ARF (acute renal failure) ([place]) 05/03/2014  .  Arthritis    LEFT HIP  . Asthma    HOSPITALIZED WITH EXCERBATION OF ASTHMA - AND BRONCHITIS AND THE FLU DEC 2013  . Asthma exacerbation attacks 12/28/2011  . Bronchitis 12/28/2011  . CAP (community acquired pneumonia) 07/25/2012  . Chronic kidney disease (CKD), stage IV (severe) ([place]) 11/10/2014  . Class 3 obesity due to excess calories with serious comorbidity and body mass index (BMI) of 40.0 to 44.9 in adult   . Colles' fracture of left radius   . COPD (chronic obstructive pulmonary disease) ([place])   . Depression   . Diabetes mellitus    ON INSULIN AND ORAL MEDICATIONS  . Diabetes mellitus type 2 with complications, uncontrolled ([place]) 04/19/2008   Qualifier: Diagnosis of  By: Tomma Lightning MD, Claiborne Billings     . Diabetic foot infection ([place]) 11/09/2014  . Diabetic ketoacidosis without coma associated with type 2 diabetes mellitus ([place])   . Diabetic neuropathy ([place]) 11/10/2014  . Diarrhea 08/30/2008   Qualifier: Diagnosis of  By: Tomma Lightning MD, Claiborne Billings    . Diastolic dysfunction 73/04/1936  . Difficult intravenous access   . DKA, type 2 ([place]) 04/04/2016  . Dyslipidemia 12/23/2008   Qualifier: Diagnosis of  By: Tomma Lightning MD, Claiborne Billings    . Essential hypertension 04/19/2008   Qualifier: Diagnosis of  By: Tomma Lightning MD, Claiborne Billings    . FATIGUE 07/12/2008   Qualifier: Diagnosis of  By: Tomma Lightning MD, Claiborne Billings    . Femoral neck fracture ([place]) 07/16/2011  . Femur fracture, left ([place]) 07/08/2014  . Fever   . Fracture of distal femur ([place]) 07/08/2014  . GERD (gastroesophageal reflux disease)   . Hereditary and idiopathic peripheral neuropathy 04/19/2008   Qualifier: Diagnosis of  By: Tomma Lightning MD, Claiborne Billings    . Hip fracture, left ([place]) 07/16/2011  . Hip pain   . HIV infection ([place]) 2000  . Human immunodeficiency virus (HIV) disease ([place]) 04/19/2008   [id number] +   . Hyperlipidemia   . Hypertension   . Hyponatremia 12/29/2011  . Influenza A 12/29/2011  . Influenza B   . Insomnia 06/12/2016  . Left hip pain 05/03/2014  . Mood disorder ([place])  04/19/2008   Qualifier: Diagnosis of  By: Tomma Lightning MD, Claiborne Billings    . Neuropathy    NEUROPATHY HANDS AND FEET  . NSTEMI (non-ST elevated myocardial infarction) ([place]) 05/19/2016  . Obesity hypoventilation syndrome ([place])   . Osteoporosis 07/08/2014  . Pain    SEVERE PAIN LEFT HIP - HX OF LEFT HIP PINNING JULY 2013  . Perimenopausal symptoms 02/13/2012   LMP around 2011. On estrace and provera since around 2012 for hot flashes, mood swings. Estrace 2 mg daily, Provera 2.5 mg for 5 days each month.   . Repeated falls 07/30/2011  . Sepsis ([place]) 04/04/2016  . Shortness of breath    ALLERGIES ARE "ACTING UP"  . SOB (shortness of breath)   . THRUSH 05/20/2008   Qualifier: Diagnosis of  By: Tomma Lightning MD, Claiborne Billings      . Tobacco use disorder 04/14/2010    Tobacco History: Social History   Tobacco Use  Smoking Status Current Every Day Smoker  . Packs/day: 1.50  . Years: 38.00  . Pack years: 57.00  . Types: E-cigarettes, Cigarettes  . Start date: 03/10/1981  Smokeless Tobacco Never Used  Tobacco Comment   3/4 of a pack per day 03/11/18   Ready to quit: Not Answered Counseling given: Not Answered Comment: 3/4 of a pack per day 03/11/18  Smoking assessment and cessation counseling  Patient currently smoking: 0.75 ppd, e cigarettes  I have advised the patient to quit/stop smoking as soon as possible due to high risk for multiple medical problems.  It will also be very difficult for [place] to manage patient's  respiratory symptoms and status if we continue to expose her lungs to a known irritant.  We do not advise e-cigarettes as a form of stopping smoking.  Patient is willing to quit smoking. Hasnt set quit date.   I have advised the patient that we can assist and have options of nicotine replacement therapy, provided smoking cessation education today, provided smoking cessation counseling, and provided cessation resources. Stopped smoking for 9 months before.   Follow-up next office visit office visit for  assessment of smoking cessation.  Smoking cessation counseling advised for: 4 min   Outpatient Encounter Medications as of 03/11/2018  Medication Sig  . albuterol (PROVENTIL HFA;VENTOLIN HFA) 108 (90 Base) MCG/ACT inhaler INHALE 2 PUFFS INTO THE LUNGS EVERY 6 HOURS AS NEEDED FOR WHEEZING OR SHORTNESS OF BREATH  . diphenoxylate-atropine (LOMOTIL) 2.5-0.025 MG tablet Take 1 tablet by mouth 4 (four) times daily as needed for diarrhea or loose stools.  . fenofibrate (TRICOR) 145 MG tablet Take 1 tablet (145 mg total) by mouth daily.  . fluticasone furoate-vilanterol (BREO ELLIPTA) 100-25 MCG/INH AEPB INHALE 1 PUFF INTO THE LUNGS DAILY  . GENVOYA 150-150-200-10 MG TABS tablet TAKE 1 TABLET BY MOUTH DAILY  . glucose blood (ACCU-CHEK AVIVA) test strip Use as instructed to check blood sugar 2 times daily.  . Insulin Glargine, 2 Unit Dial, (TOUJEO MAX SOLOSTAR) 300 UNIT/ML SOPN Inject 50 Units into the skin 2 (two) times daily. INJECT 50 UNITS UNDER THE SKIN TWICE DAILY.  [name] Kitchen ipratropium-albuterol (DUONEB) 0.5-2.5 (3) MG/3ML SOLN Take 3 mLs by nebulization every 6 (six) hours.  [name] Kitchen liraglutide (VICTOZA) 18 MG/3ML SOPN ADMINISTER 1.8 MG UNDER THE SKIN DAILY  . MAGNESIUM-OXIDE 400 (241.3 Mg) MG tablet Take 400 mg by mouth daily.   . pantoprazole (PROTONIX) 40 MG tablet Take 1 tablet (40 mg total) by mouth daily.  . pregabalin (LYRICA) 225 MG capsule Take 225 mg by mouth 2 (two) times daily. TAKE 1 CAPSULE (225MG) BY MOUTH TWICE DAILY.  . rosuvastatin (CRESTOR) 20 MG tablet Take 1 tablet (20 mg total) by mouth daily. Reported on 05/02/2015  . Fluticasone-Umeclidin-Vilant (TRELEGY ELLIPTA) 100-62.5-25 MCG/INH AEPB Inhale 1 puff into the lungs daily.  . montelukast (SINGULAIR) 10 MG tablet Take 1 tablet (10 mg total) by mouth at bedtime.  . [DISCONTINUED] fluticasone furoate-vilanterol (BREO ELLIPTA) 100-25 MCG/INH AEPB Inhale 1 puff into the lungs daily.   No facility-administered encounter medications on file  as of 03/11/2018.      Review of Systems  Review of Systems  Constitutional: Positive for fatigue. Negative for chills, fever and unexpected weight change.  HENT: Positive for congestion. Negative for ear pain, postnasal drip, sinus pressure and sinus pain.   Respiratory: Positive for cough, chest tightness, shortness of breath and wheezing.   Cardiovascular: Negative for chest pain and palpitations.  Gastrointestinal: Negative for diarrhea, nausea and vomiting.  Musculoskeletal: Positive for gait problem. Negative for arthralgias.  Skin: Negative for color change.  Allergic/Immunologic: Negative for environmental allergies and food allergies.  Neurological: Negative  for dizziness, light-headedness and headaches.  Psychiatric/Behavioral: Negative for dysphoric mood. The patient is not nervous/anxious.   All other systems reviewed and are negative.    Physical Exam  BP 130/68 (BP Location: Right Arm, Cuff Size: Large)   Pulse (!) 101   Ht _0  (1.753 m)   Wt 243 lb 6.4 oz (110.4 kg)   LMP 11/16/2010   SpO2 95%   BMI 35.94 kg/m   Wt Readings from Last 5 Encounters:  03/11/18 243 lb 6.4 oz (110.4 kg)  01/24/18 246 lb (111.6 kg)  01/02/18 236 lb (107 kg)  01/01/18 232 lb 2.3 oz (105.3 kg)  10/03/17 246 lb 3.2 oz (111.7 kg)    Physical Exam  Constitutional: She is oriented to person, place, and time and well-developed, well-nourished, and in no distress. No distress.  HENT:  Head: Normocephalic and atraumatic.  Right Ear: Hearing, tympanic membrane, external ear and ear canal normal.  Left Ear: Hearing, tympanic membrane, external ear and ear canal normal.  Nose: Mucosal edema and rhinorrhea present. Right sinus exhibits no maxillary sinus tenderness and no frontal sinus tenderness. Left sinus exhibits no maxillary sinus tenderness and no frontal sinus tenderness.  Mouth/Throat: Uvula is midline and oropharynx is clear and moist. No oropharyngeal exudate.  Postnasal drip    Eyes: Pupils are equal, round, and reactive to light.  Neck: Normal range of motion. Neck supple.  Cardiovascular: Normal rate, regular rhythm and normal heart sounds.  Pulmonary/Chest: Effort normal. No accessory muscle usage. No respiratory distress. She has no decreased breath sounds. She has wheezes. She has no rhonchi.  Abdominal: Soft. Bowel sounds are normal. She exhibits no distension. There is no abdominal tenderness.  Obese abdomen  Musculoskeletal:        General: No edema.  Lymphadenopathy:    She has no cervical adenopathy.  Neurological: She is alert and oriented to person, place, and time.  In wheelchair  Skin: Skin is warm and dry. She is not diaphoretic. No erythema.  Psychiatric: Mood, memory, affect and judgment normal.  Nursing note and vitals reviewed.   03/11/2018-office spirometry- FVC 1.7 (41% predicted), ratio 67, FEV1 1.1 (35% predicted) >>> Severe obstruction  Lab Results:  CBC    Component Value Date/Time   WBC 17.0 (H) 01/05/2018 0147   RBC 4.41 01/05/2018 0147   HGB 12.6 01/05/2018 0147   HCT 41.1 01/05/2018 0147   PLT 298 01/05/2018 0147   MCV 93.2 01/05/2018 0147   MCH 28.6 01/05/2018 0147   MCHC 30.7 01/05/2018 0147   RDW 19.9 (H) 01/05/2018 0147   LYMPHSABS 1.8 01/05/2018 0147   MONOABS 0.8 01/05/2018 0147   EOSABS 0.1 01/05/2018 0147   BASOSABS 0.0 01/05/2018 0147    BMET    Component Value Date/Time   NA 135 01/21/2018 1550   NA 144 09/12/2017   K 4.9 01/21/2018 1550   CL 97 01/21/2018 1550   CO2 30 01/21/2018 1550   GLUCOSE 291 (H) 01/21/2018 1550   BUN 36 (H) 01/21/2018 1550   BUN 21 09/12/2017   CREATININE 1.36 (H) 01/21/2018 1550   CREATININE 1.23 (H) 05/21/2017 1624   CALCIUM 9.6 01/21/2018 1550   GFRNONAA 54 (L) 01/05/2018 0147   GFRNONAA 37 (L) 06/12/2016 1638   GFRAA >60 01/05/2018 0147   GFRAA 42 (L) 06/12/2016 1638    BNP    Component Value Date/Time   BNP 29.8 01/05/2018 0147    ProBNP    Component  Value Date/Time  PROBNP 68.4 07/25/2012 1538      Assessment & Plan:     Mucopurulent chronic bronchitis ([place]) Assessment: mMRC 3 today Office spirometry today shows severe obstruction with an FEV1 of 1.1 (35% predicted) Patient took her Symbicort 160 today BMI 35.94 Lungs with expiratory wheeze today Recurrent exacerbations  Plan: Trial of Trelegy Ellipta inhaler today Follow-up in 4 weeks with pulmonary function testing Can use rescue inhaler as needed Start Singulair You need to stop smoking, we can further discuss this at further   Allergic rhinitis Assessment: AR flare on exam today  Plan: We recommend the patient start a daily antihistamine, patient reports she cannot afford over-the-counter meds We will start Singulair Patient can also do nasal saline rinses   Tobacco abuse Assessment: Current every day smoker Currently smoking 0.75 pack/day 57 pack-year smoking history AR flare on exam Severe obstruction on spirometry today Patient also using E cigarettes occasionally  Plan: Recommend patient that she needs to stop smoking Patient also needs to stop smoking E cigarettes Patient is not ready to quit smoking at this time Explained to the fact that this will cause her to have continued exacerbations as well as worsening lung function     Lauraine Rinne, NP 03/11/2018   This appointment was 30 min long with over 50% of the time in direct face-to-face patient care, assessment, plan of care, and follow-up.

## 2018-03-11 NOTE — Progress Notes (Signed)
Discussed results with patient in office.  Nothing further is needed at this time.  Arush Gatliff FNP  

## 2018-03-11 NOTE — Assessment & Plan Note (Addendum)
Assessment: mMRC 3 today Office spirometry today shows severe obstruction with an FEV1 of 1.1 (35% predicted) Patient took her Symbicort 160 today BMI 35.94 Lungs with expiratory wheeze today Recurrent exacerbations  Plan: Trial of Trelegy Ellipta inhaler today Follow-up in 4 weeks with pulmonary function testing Can use rescue inhaler as needed Start Singulair You need to stop smoking, we can further discuss this at further

## 2018-03-11 NOTE — Assessment & Plan Note (Signed)
Assessment: Current every day smoker Currently smoking 0.75 pack/day 57 pack-year smoking history AR flare on exam Severe obstruction on spirometry today Patient also using E cigarettes occasionally  Plan: Recommend patient that she needs to stop smoking Patient also needs to stop smoking E cigarettes Patient is not ready to quit smoking at this time Explained to the fact that this will cause her to have continued exacerbations as well as worsening lung function

## 2018-03-11 NOTE — Patient Instructions (Addendum)
Spirometry today  >>>showed severe airway obstruction  Trial of Trelegy Ellipta  >>> 1 puff daily in the morning >>>rinse mouth out after use  >>> This inhaler contains 3 medications that help manage her respiratory status, contact our office if you cannot afford this medication or unable to remain on this medication >>>samples provided today   Start daily Singulair  Please start taking a daily antihistamine:  >>>choose one of: zyrtec, claritin, allegra, or xyzal  >>>these are over the counter medications  >>>can choose generic option  >>>take daily  >>>this medication helps with allergies, post nasal drip, and cough    Only use your albuterol as a rescue medication to be used if you can't catch your breath by resting or doing a relaxed purse lip breathing pattern.  - The less you use it, the better it will work when you need it. - Ok to use up to 2 puffs  every 4 hours if you must but call for immediate appointment if use goes up over your usual need - Don't leave home without it !!  (think of it like the spare tire for your car)    We recommend that you stop smoking.  >>>You need to set a quit date >>>If you have friends or family who smoke, let them know you are trying to quit and not to smoke around you or in your living environment   Smoking Cessation Resources:  1 800 QUIT NOW  >>> Patient to call this resource and utilize it to help support her quit smoking >>> Keep up your hard work with stopping smoking  You can also contact the [facility] >>>For smoking cessation classes call [phone]  We do not recommend using e-cigarettes as a form of stopping smoking  You can sign up for smoking cessation support texts and information:  >>>https://smokefree.gov/smokefreetxt    Note your daily symptoms > remember "red flags" for COPD:   >>>Increase in cough >>>increase in sputum production >>>increase in shortness of breath or activity  intolerance.    If you notice these symptoms, please call the office to be seen.    Follow-up with Dr. Halford Chessman or Wyn Quaker FNP with a pulmonary function test in 4 weeks   It is flu season:   >>> Best ways to protect herself from the flu: Receive the yearly flu vaccine, practice good hand hygiene washing with soap and also using hand sanitizer when available, eat a nutritious meals, get adequate rest, hydrate appropriately   Please contact the office if your symptoms worsen or you have concerns that you are not improving.   Thank you for choosing [facility] for your healthcare, and for allowing [place] to partner with you on your healthcare journey. I am thankful to be able to provide care to you today.   Wyn Quaker FNP-C    Health Risks of Smoking Smoking cigarettes is very bad for your health. Tobacco smoke has over 200 known poisons in it. It contains the poisonous gases nitrogen oxide and carbon monoxide. There are over 60 chemicals in tobacco smoke that cause cancer. Smoking is difficult to quit because a chemical in tobacco, called nicotine, causes addiction or dependence. When you smoke and inhale, nicotine is absorbed rapidly into the bloodstream through your lungs. Both inhaled and non-inhaled nicotine may be addictive. What are the risks of cigarette smoke? Cigarette smokers have an increased risk of many serious medical problems, including:  Lung cancer.  Lung disease, such as pneumonia,  bronchitis, and emphysema.  Chest pain (angina) and heart attack because the heart is not getting enough oxygen.  Heart disease and peripheral blood vessel disease.  High blood pressure (hypertension).  Stroke.  Oral cancer, including cancer of the lip, mouth, or voice box.  Bladder cancer.  Pancreatic cancer.  Cervical cancer.  Pregnancy complications, including premature birth.  Stillbirths and smaller newborn babies, birth defects, and genetic damage to sperm.  Early  menopause.  Lower estrogen level for women.  Infertility.  Facial wrinkles.  Blindness.  Increased risk of broken bones (fractures).  Senile dementia.  Stomach ulcers and internal bleeding.  Delayed wound healing and increased risk of complications during surgery.  Even smoking lightly shortens your life expectancy by several years. Because of secondhand smoke exposure, children of smokers have an increased risk of the following:  Sudden infant death syndrome (SIDS).  Respiratory infections.  Lung cancer.  Heart disease.  Ear infections. What are the benefits of quitting? There are many health benefits of quitting smoking. Here are some of them:  Within days of quitting smoking, your risk of having a heart attack decreases, your blood flow improves, and your lung capacity improves. Blood pressure, pulse rate, and breathing patterns start returning to normal soon after quitting.  Within months, your lungs may clear up completely.  Quitting for 10 years reduces your risk of developing lung cancer and heart disease to almost that of a nonsmoker.  People who quit may see an improvement in their overall quality of life. How do I quit smoking?     Smoking is an addiction with both physical and psychological effects, and longtime habits can be hard to change. Your health care provider can recommend:  Programs and community resources, which may include group support, education, or talk therapy.  Prescription medicines to help reduce cravings.  Nicotine replacement products, such as patches, gum, and nasal sprays. Use these products only as directed. Do not replace cigarette smoking with electronic cigarettes, which are commonly called e-cigarettes. The safety of e-cigarettes is not known, and some may contain harmful chemicals.  A combination of two or more of these methods. Where to find more information  American Lung Association: www.lung.org  American Cancer  Society: www.cancer.org Summary  Smoking cigarettes is very bad for your health. Cigarette smokers have an increased risk of many serious medical problems, including several cancers, heart disease, and stroke.  Smoking is an addiction with both physical and psychological effects, and longtime habits can be hard to change.  By stopping right away, you can greatly reduce the risk of medical problems for you and your family.  To help you quit smoking, your health care provider can recommend programs, community resources, prescription medicines, and nicotine replacement products such as patches, gum, and nasal sprays. This information is not intended to replace advice given to you by your health care provider. Make sure you discuss any questions you have with your health care provider. Document Released: 01/26/2004 Document Revised: 03/21/2017 Document Reviewed: 12/23/2015 Elsevier Interactive Patient Education  2019 [employer].    Steps to Quit Smoking  Smoking tobacco can be bad for your health. It can also affect almost every organ in your body. Smoking puts you and people around you at risk for many serious long-lasting (chronic) diseases. Quitting smoking is hard, but it is one of the best things that you can do for your health. It is never too late to quit. What are the benefits of quitting smoking? When you  quit smoking, you lower your risk for getting serious diseases and conditions. They can include:  Lung cancer or lung disease.  Heart disease.  Stroke.  Heart attack.  Not being able to have children (infertility).  Weak bones (osteoporosis) and broken bones (fractures). If you have coughing, wheezing, and shortness of breath, those symptoms may get better when you quit. You may also get sick less often. If you are pregnant, quitting smoking can help to lower your chances of having a baby of low birth weight. What can I do to help me quit smoking? Talk with your doctor  about what can help you quit smoking. Some things you can do (strategies) include:  Quitting smoking totally, instead of slowly cutting back how much you smoke over a period of time.  Going to in-person counseling. You are more likely to quit if you go to many counseling sessions.  Using resources and support systems, such as: ? Database administrator with a Social worker. ? Phone quitlines. ? Careers information officer. ? Support groups or group counseling. ? Text messaging programs. ? Mobile phone apps or applications.  Taking medicines. Some of these medicines may have nicotine in them. If you are pregnant or breastfeeding, do not take any medicines to quit smoking unless your doctor says it is okay. Talk with your doctor about counseling or other things that can help you. Talk with your doctor about using more than one strategy at the same time, such as taking medicines while you are also going to in-person counseling. This can help make quitting easier. What things can I do to make it easier to quit? Quitting smoking might feel very hard at first, but there is a lot that you can do to make it easier. Take these steps:  Talk to your family and friends. Ask them to support and encourage you.  Call phone quitlines, reach out to support groups, or work with a Social worker.  Ask people who smoke to not smoke around you.  Avoid places that make you want (trigger) to smoke, such as: ? Bars. ? Parties. ? Smoke-break areas at work.  Spend time with people who do not smoke.  Lower the stress in your life. Stress can make you want to smoke. Try these things to help your stress: ? Getting regular exercise. ? Deep-breathing exercises. ? Yoga. ? Meditating. ? Doing a body scan. To do this, close your eyes, focus on one area of your body at a time from head to toe, and notice which parts of your body are tense. Try to relax the muscles in those areas.  Download or buy apps on your mobile phone or tablet  that can help you stick to your quit plan. There are many free apps, such as QuitGuide from the [employer] Office manager for Disease Control and Prevention). You can find more support from smokefree.gov and other websites. This information is not intended to replace advice given to you by your health care provider. Make sure you discuss any questions you have with your health care provider. Document Released: 10/14/2008 Document Revised: 08/16/2015 Document Reviewed: 05/04/2014 Elsevier Interactive Patient Education  2019 [employer].

## 2018-03-11 NOTE — Assessment & Plan Note (Signed)
Assessment: AR flare on exam today  Plan: We recommend the patient start a daily antihistamine, patient reports she cannot afford over-the-counter meds We will start Singulair Patient can also do nasal saline rinses

## 2018-03-12 NOTE — Telephone Encounter (Signed)
Can you please verify insulin dosage? According to last office visit, instructions were to take 45 units of Humulin U-500 at breakfast, and 10-15 units in the late evening if the pt has a sweet snack. Is this the instructions you prefer or should the pt have insulin at lunch and dinner as well?

## 2018-03-12 NOTE — Telephone Encounter (Signed)
45 units before breakfast, 30 units before dinner and 15 units before snack

## 2018-03-13 ENCOUNTER — Other Ambulatory Visit: Payer: Self-pay | Admitting: *Deleted

## 2018-03-13 NOTE — Patient Outreach (Signed)
[place] [facility]) Care Management  03/13/2018  [name] 08-08-1965 [id number]   Call placed to member for telephone assessment, 2nd attempt.  She report this is not a good time, request call back around 430 pm.     Update @ 1645:  Call placed back to member per her request, no answer.  HIPAA compliant voice message left.  Will await call back, if no call back will follow up within the next 4 business days.  Valente David, [place], MSN [place] [phone]

## 2018-03-15 ENCOUNTER — Other Ambulatory Visit: Payer: Self-pay | Admitting: Endocrinology

## 2018-03-17 DIAGNOSIS — H1033 Unspecified acute conjunctivitis, bilateral: Secondary | ICD-10-CM | POA: Diagnosis not present

## 2018-03-18 ENCOUNTER — Other Ambulatory Visit: Payer: Self-pay

## 2018-03-19 ENCOUNTER — Other Ambulatory Visit: Payer: Self-pay | Admitting: *Deleted

## 2018-03-19 NOTE — Patient Outreach (Signed)
[place] [facility]) Care Management  03/19/2018  JASON HAUGE 01/17/1965 [id number]   Third unsuccessful attempt to contact member for telephone assessment.  HIPAA compliant voice message left.  If no call back by 3/20, will close case due to inability to contact.  Valente David, [place], MSN [place] [phone]

## 2018-03-20 DIAGNOSIS — Z794 Long term (current) use of insulin: Secondary | ICD-10-CM | POA: Diagnosis not present

## 2018-03-20 DIAGNOSIS — H40033 Anatomical narrow angle, bilateral: Secondary | ICD-10-CM | POA: Diagnosis not present

## 2018-03-20 DIAGNOSIS — H1045 Other chronic allergic conjunctivitis: Secondary | ICD-10-CM | POA: Diagnosis not present

## 2018-03-20 DIAGNOSIS — E119 Type 2 diabetes mellitus without complications: Secondary | ICD-10-CM | POA: Diagnosis not present

## 2018-03-21 ENCOUNTER — Other Ambulatory Visit: Payer: Self-pay | Admitting: *Deleted

## 2018-03-21 NOTE — Patient Outreach (Signed)
[place] [facility]) Care Management  03/21/2018  [name] Aug 30, 1965 [id number]   No response from member after multiple unsuccessful outreach attempts and letter sent.  Will close case at this time due to inability to maintain contact.  Will notify member and primary MD of case closure.   Valente David, [place], MSN [place] [phone]

## 2018-03-28 ENCOUNTER — Other Ambulatory Visit: Payer: Self-pay | Admitting: Infectious Diseases

## 2018-04-01 ENCOUNTER — Ambulatory Visit
Admission: RE | Admit: 2018-04-01 | Discharge: 2018-04-01 | Disposition: A | Discharge status: Home / Self Care | Payer: Medicare HMO | Source: Ambulatory Visit | Attending: Family Medicine | Admitting: Family Medicine

## 2018-04-01 ENCOUNTER — Other Ambulatory Visit: Payer: Self-pay

## 2018-04-01 ENCOUNTER — Other Ambulatory Visit: Payer: Self-pay | Admitting: Family Medicine

## 2018-04-01 DIAGNOSIS — Z9189 Other specified personal risk factors, not elsewhere classified: Secondary | ICD-10-CM | POA: Diagnosis not present

## 2018-04-01 DIAGNOSIS — N200 Calculus of kidney: Secondary | ICD-10-CM | POA: Diagnosis not present

## 2018-04-01 DIAGNOSIS — R1011 Right upper quadrant pain: Secondary | ICD-10-CM

## 2018-04-01 DIAGNOSIS — R109 Unspecified abdominal pain: Secondary | ICD-10-CM | POA: Diagnosis not present

## 2018-04-01 DIAGNOSIS — R399 Unspecified symptoms and signs involving the genitourinary system: Secondary | ICD-10-CM | POA: Diagnosis not present

## 2018-04-01 DIAGNOSIS — M5136 Other intervertebral disc degeneration, lumbar region: Secondary | ICD-10-CM | POA: Diagnosis not present

## 2018-04-03 ENCOUNTER — Encounter (INDEPENDENT_AMBULATORY_CARE_PROVIDER_SITE_OTHER): Payer: Self-pay | Admitting: Orthopaedic Surgery

## 2018-04-03 ENCOUNTER — Other Ambulatory Visit: Payer: Self-pay

## 2018-04-03 ENCOUNTER — Telehealth: Payer: Self-pay | Admitting: Pulmonary Disease

## 2018-04-03 ENCOUNTER — Ambulatory Visit (INDEPENDENT_AMBULATORY_CARE_PROVIDER_SITE_OTHER): Payer: Medicare HMO | Admitting: Orthopaedic Surgery

## 2018-04-03 ENCOUNTER — Ambulatory Visit (INDEPENDENT_AMBULATORY_CARE_PROVIDER_SITE_OTHER): Payer: Medicare HMO

## 2018-04-03 DIAGNOSIS — J411 Mucopurulent chronic bronchitis: Secondary | ICD-10-CM

## 2018-04-03 DIAGNOSIS — H1045 Other chronic allergic conjunctivitis: Secondary | ICD-10-CM | POA: Diagnosis not present

## 2018-04-03 DIAGNOSIS — M87052 Idiopathic aseptic necrosis of left femur: Secondary | ICD-10-CM

## 2018-04-03 DIAGNOSIS — H40033 Anatomical narrow angle, bilateral: Secondary | ICD-10-CM | POA: Diagnosis not present

## 2018-04-03 MED ORDER — FLUTICASONE-UMECLIDIN-VILANT 100-62.5-25 MCG/INH IN AEPB: 1.0000 | INHALATION_SPRAY | Freq: Every day | RESPIRATORY_TRACT | 3 refills | Status: DC

## 2018-04-03 NOTE — Telephone Encounter (Signed)
Patient is returning phone call.  Patient phone number is [phone].

## 2018-04-03 NOTE — Telephone Encounter (Signed)
Called and spoke with pt verifying the med and preferred pharmacy. I have sent the Trelegy Rx to pharmacy of pt's choice. Also while speaking with pt, stated to her that we needed to schedule a f/u visit for her as Aaron Edelman had said for her to follow up in 4 weeks from last OV. Stated to her that at this time we were not doing PFTs but we could at least get her scheduled for televisit with Aaron Edelman so he could see how things have been going for her. Pt expressed understanding. televisit has been scheduled for pt 4/9 at 1:30. Nothing further needed.

## 2018-04-03 NOTE — Telephone Encounter (Signed)
ATC Will try again.   Pt seen on 3/10 and given a sample of Trelegy. She is request rx be sent to pharmacy. Pt is due for f/u in 1 month. Not scheduled.

## 2018-04-03 NOTE — Progress Notes (Signed)
Office Visit Note   Patient: [name]           Date of Birth: August 04, 1965           MRN: [id number] Visit Date: 04/03/2018              Requested by: [name], PA-C [place], [place] [address] PCP: [name], PA-C   Assessment & Plan: Visit Diagnoses:  1. Avascular necrosis of bone of left hip ([name])     Plan: Impression is left hip avascular necrosis.  The patient appears to be somewhat asymptomatic, however she has not been ambulating likely due to a poor fitting prosthesis.  She does note that she should have an upcoming appointment to be fitted for a new prosthesis within the next few weeks.  She will follow-up with [place] after she gets the new prosthetic if she is continuing to have instability.  Of note, we did offer her a cortisone injection today but she politely declined  Follow-Up Instructions: Return if symptoms worsen or fail to improve.   Orders:  Orders Placed This Encounter  Procedures  . XR HIP UNILAT W OR W/O PELVIS 2-3 VIEWS LEFT   No orders of the defined types were placed in this encounter.     Procedures: No procedures performed   Clinical Data: No additional findings.   Subjective: Chief Complaint  Patient presents with  . Left Hip - Pain    HPI patient is a pleasant 53 year old female who presents our clinic today for follow-up of her left hip.  She recently had a CT scan on 04/01/2018 for complaints of right upper quadrant pain.  It was noted on the CT scan she had left femoral head avascular necrosis.  She comes in today for follow-up.  She denies any pain to the anterior thigh or groin.  She is status post left BKA, wears a prosthesis and comes in today in a wheelchair.she has been unable to ambulate since getting original prosthesis.  She feels as though her leg gives way when trying to ambulate.  She is scheduled to have new fitting in a few weeks.   Review of Systems as detailed in HPI.  All others  reviewed and are negative.   Objective: Vital Signs: LMP 11/16/2010   Physical Exam well-developed and well-nourished female in no acute distress.  Alert and oriented x3.  Ortho Exam examination of her left upper extremity reveals a negative logroll.  Negative straight leg raise.  Specialty Comments:  No specialty comments available.  Imaging: Xr Hip Unilat W Or W/o Pelvis 2-3 Views Left  Result Date: 04/03/2018 X-rays demonstrate avascular necrosis and collapse of the femoral head with a shortened neck with varus deformity    PMFS History: Patient Active Problem List   Diagnosis Date Noted  . Avascular necrosis of bone of left hip ([place]) 04/03/2018  . Mucopurulent chronic bronchitis ([place]) 03/11/2018  . Allergic rhinitis 03/11/2018  . Anxiety and depression 12/29/2017  . Acute respiratory failure with hypoxia ([place]) 12/28/2017  . Syncope 08/29/2017  . DKA (diabetic ketoacidoses) ([place]) 03/02/2017  . COPD exacerbation ([place]) 02/23/2017  . Acute on chronic respiratory failure with hypoxia and hypercapnia ([place]) 08/19/2016  . Leukocytosis 08/19/2016  . CKD (chronic kidney disease), stage III ([place]) 08/19/2016  . Altered mental status 08/19/2016  . Asthma exacerbation 06/18/2016  . Insomnia 06/12/2016  . Difficult intravenous access   . Acute on chronic respiratory failure with hypoxia ([place]) 05/19/2016  .  NSTEMI (non-ST elevated myocardial infarction) ([place]) 05/19/2016  . Acute metabolic encephalopathy 49/82/6415  . Hyperlipidemia   . Class 3 obesity due to excess calories with serious comorbidity and body mass index (BMI) of 40.0 to 44.9 in adult   . Obesity hypoventilation syndrome ([place])   . Diastolic dysfunction 83/09/4074  . Diabetic neuropathy ([place]) 11/10/2014  . Fracture of distal femur ([place]) 07/08/2014  . Osteoporosis 07/08/2014  . Femur fracture, left ([place]) 07/08/2014  . Colles' fracture of left radius   . Left hip pain 05/03/2014  . Perimenopausal symptoms 02/13/2012   . Hyponatremia 12/29/2011  . Asthma exacerbation attacks 12/28/2011  . Bronchitis 12/28/2011  . Repeated falls 07/30/2011  . Hip fracture, left ([place]) 07/16/2011  . Femoral neck fracture ([place]) 07/16/2011  . Tobacco abuse 04/14/2010  . Hyperlipidemia associated with type 2 diabetes mellitus ([place]) 12/23/2008  . COPD with acute bronchitis ([place]) 11/29/2008  . THRUSH 05/20/2008  . Human immunodeficiency virus (HIV) disease ([place]) 04/19/2008  . Diabetes mellitus type 2 with complications, uncontrolled ([place]) 04/19/2008  . Mood disorder ([place]) 04/19/2008  . Hereditary and idiopathic peripheral neuropathy 04/19/2008  . Hypertension associated with diabetes ([place]) 04/19/2008   Past Medical History:  Diagnosis Date  . Acute metabolic encephalopathy 08/08/8108  . Acute on chronic respiratory failure with hypoxia ([place]) 05/19/2016  . Anxiety   . ARF (acute renal failure) ([place]) 05/03/2014  . Arthritis    LEFT HIP  . Asthma    HOSPITALIZED WITH EXCERBATION OF ASTHMA - AND BRONCHITIS AND THE FLU DEC 2013  . Asthma exacerbation attacks 12/28/2011  . Bronchitis 12/28/2011  . CAP (community acquired pneumonia) 07/25/2012  . Chronic kidney disease (CKD), stage IV (severe) ([place]) 11/10/2014  . Class 3 obesity due to excess calories with serious comorbidity and body mass index (BMI) of 40.0 to 44.9 in adult   . Colles' fracture of left radius   . COPD (chronic obstructive pulmonary disease) ([place])   . Depression   . Diabetes mellitus    ON INSULIN AND ORAL MEDICATIONS  . Diabetes mellitus type 2 with complications, uncontrolled ([place]) 04/19/2008   Qualifier: Diagnosis of  By: Tomma Lightning MD, Claiborne Billings     . Diabetic foot infection ([place]) 11/09/2014  . Diabetic ketoacidosis without coma associated with type 2 diabetes mellitus ([place])   . Diabetic neuropathy ([place]) 11/10/2014  . Diarrhea 08/30/2008   Qualifier: Diagnosis of  By: Tomma Lightning MD, Claiborne Billings    . Diastolic dysfunction 31/05/9456  . Difficult intravenous access   . DKA,  type 2 ([place]) 04/04/2016  . Dyslipidemia 12/23/2008   Qualifier: Diagnosis of  By: Tomma Lightning MD, Claiborne Billings    . Essential hypertension 04/19/2008   Qualifier: Diagnosis of  By: Tomma Lightning MD, Claiborne Billings    . FATIGUE 07/12/2008   Qualifier: Diagnosis of  By: Tomma Lightning MD, Claiborne Billings    . Femoral neck fracture ([place]) 07/16/2011  . Femur fracture, left ([place]) 07/08/2014  . Fever   . Fracture of distal femur ([place]) 07/08/2014  . GERD (gastroesophageal reflux disease)   . Hereditary and idiopathic peripheral neuropathy 04/19/2008   Qualifier: Diagnosis of  By: Tomma Lightning MD, Claiborne Billings    . Hip fracture, left ([place]) 07/16/2011  . Hip pain   . HIV infection ([place]) 2000  . Human immunodeficiency virus (HIV) disease ([place]) 04/19/2008   [id number] +   . Hyperlipidemia   . Hypertension   . Hyponatremia 12/29/2011  . Influenza A 12/29/2011  . Influenza B   . Insomnia 06/12/2016  . Left hip  pain 05/03/2014  . Mood disorder ([place]) 04/19/2008   Qualifier: Diagnosis of  By: Tomma Lightning MD, Claiborne Billings    . Neuropathy    NEUROPATHY HANDS AND FEET  . NSTEMI (non-ST elevated myocardial infarction) ([place]) 05/19/2016  . Obesity hypoventilation syndrome ([place])   . Osteoporosis 07/08/2014  . Pain    SEVERE PAIN LEFT HIP - HX OF LEFT HIP PINNING JULY 2013  . Perimenopausal symptoms 02/13/2012   LMP around 2011. On estrace and provera since around 2012 for hot flashes, mood swings. Estrace 2 mg daily, Provera 2.5 mg for 5 days each month.   . Repeated falls 07/30/2011  . Sepsis ([place]) 04/04/2016  . Shortness of breath    ALLERGIES ARE "ACTING UP"  . SOB (shortness of breath)   . THRUSH 05/20/2008   Qualifier: Diagnosis of  By: Tomma Lightning MD, Claiborne Billings      . Tobacco use disorder 04/14/2010    Family History  Problem Relation Age of Onset  . Diabetes Mother   . Hypertension Mother   . Vision loss Mother   . Heart disease Mother   . Hypertension Father   . Thyroid disease Neg Hx     Past Surgical History:  Procedure Laterality Date  . AMPUTATION Left 11/18/2014    Procedure: LEFT BELOW KNEE AMPUTATION;  Surgeon: Leandrew Koyanagi, MD;  Location: [facility];  Service: Orthopedics;  Laterality: Left;  . CAST APPLICATION Left 07/06/6431   Procedure: CAST APPLICATION (FIBERGLASS);  Surgeon: Latanya Maudlin, MD;  Location: [facility];  Service: Orthopedics;  Laterality: Left;  CLOSED REDUCTION OF LEFT COLLES FRACTURE WITH SHORT ARM CAST  . HARDWARE REMOVAL Left 05/05/2014   Procedure: HARDWARE REMOVAL LEFT HIP;  Surgeon: Latanya Maudlin, MD;  Location: [facility];  Service: Orthopedics;  Laterality: Left;  REMOVAL BIOMET 6.5-8.0 CANNULATED SCREW  . HIP ARTHROPLASTY Left 05/05/2014   Procedure: ARTHROPLASTY OPEN REDUCTION INTERNAL FIXATION LEFT HIP AND REMOVAL OF TWO CANNULATED SCREW;  Surgeon: Latanya Maudlin, MD;  Location: [facility];  Service: Orthopedics;  Laterality: Left;  . HIP PINNING,CANNULATED  07/16/2011   Procedure: CANNULATED HIP PINNING;  Surgeon: Gearlean Alf, MD;  Location: [facility];  Service: Orthopedics;  Laterality: Left;  . HIP PINNING,CANNULATED Left 04/09/2012   Procedure: CANNULATED HIP PINNING AND HARDWARE REVISION;  Surgeon: Gearlean Alf, MD;  Location: [facility];  Service: Orthopedics;  Laterality: Left;  . I&D EXTREMITY Left 11/16/2014   Procedure:  DEBRIDEMENT OF LEFT FOOT POSSIBLE APPLICATION OF INTEGRIA AND VAC ;  Surgeon: Irene Limbo, MD;  Location: [facility];  Service: Plastics;  Laterality: Left;  . PERIPHERAL VASCULAR CATHETERIZATION N/A 11/15/2014   Procedure: Abdominal Aortogram;  Surgeon: Conrad Woodstown, MD;  Location: [facility];  Service: Cardiovascular;  Laterality: N/A;  . PERIPHERAL VASCULAR CATHETERIZATION  11/15/2014   Procedure: Lower Extremity Angiography;  Surgeon: Conrad [place] Hughes, MD;  Location: [facility];  Service: Cardiovascular;;  . PERIPHERAL VASCULAR CATHETERIZATION Left 11/15/2014   Procedure: Peripheral Vascular Intervention;  Surgeon: Conrad , MD;  Location: [facility];  Service: Cardiovascular;  Laterality: Left;   popliteal artery stenting   Social History   Occupational History  . Not on file  Tobacco Use  . Smoking status: Current Every Day Smoker    Packs/day: 1.50    Years: 38.00    Pack years: 57.00    Types: E-cigarettes, Cigarettes    Start date: 03/10/1981  . Smokeless tobacco: Never Used  . Tobacco comment:  3/4 of a pack per day 03/11/18  Substance and Sexual Activity  . Alcohol use: No    Alcohol/week: 0.0 standard drinks    Comment: no drink since 2008  . Drug use: No    Comment: hx of crack/cocaine, clean 8 years  . Sexual activity: Not Currently    Partners: Male

## 2018-04-04 DIAGNOSIS — R748 Abnormal levels of other serum enzymes: Secondary | ICD-10-CM | POA: Diagnosis not present

## 2018-04-07 ENCOUNTER — Telehealth: Payer: Self-pay | Admitting: Endocrinology

## 2018-04-07 ENCOUNTER — Other Ambulatory Visit: Payer: Self-pay | Admitting: Endocrinology

## 2018-04-07 ENCOUNTER — Ambulatory Visit: Payer: Self-pay | Admitting: Endocrinology

## 2018-04-07 NOTE — Progress Notes (Signed)
Virtual Visit via Telephone Note  I connected with [name] on 04/08/18 at  1:30 PM EDT by telephone and verified that I am speaking with the correct person using two identifiers.   I discussed the limitations, risks, security and privacy concerns of performing an evaluation and management service by telephone and the availability of in person appointments. I also discussed with the patient that there may be a patient responsible charge related to this service. The patient expressed understanding and agreed to proceed.   History of Present Illness: 53 year old female current every day smoker followed in our office for mucopurulent chronic bronchitis and suspected COPD  PMH: HIV, mood disorder, osteoporosis, obesity, hyperlipidemia, chronic kidney disease stage III, anxiety and depression Smoker/ Smoking History: Current Smoker. 0.5 ppd.  Maintenance: Trelegy Ellipta  Pt of: Dr. Halford Chessman  Patient consented to consult via telephone:Yes  People present and their role in pt care: Pt   Chief complaint: Suspected COPD   53 year old current every day smoker followed in our office for suspected COPD.  Patient reports that she is currently down to smoking 0.5 packs/day.  She has been working to decrease her smoking since last office visit.  She feels that her breathing is better since starting Trelegy Ellipta.  She denies increased cough or wheezing.  Patient was scheduled to complete pulmonary function testing but unfortunately due to COVID-19 restriction she was unable to complete these.  She still needs to have formal PFTs done.  She does not believe that her Singulair has been working.  She cannot afford over-the-counter antihistamines at this time.  She is willing to try Flonase.  MMRC - Breathlessness Score 1 - I get short of breath when hurrying on level ground or walking up a slight hill   Smoking assessment and cessation counseling  Patient currently smoking: 0.5 ppd  I have advised  the patient to quit/stop smoking as soon as possible due to high risk for multiple medical problems.  It will also be very difficult for [place] to manage patient's  respiratory symptoms and status if we continue to expose her lungs to a known irritant.  We do not advise e-cigarettes as a form of stopping smoking.  Patient is willing to quit smoking. Needs to set quit date.   I have advised the patient that we can assist and have options of nicotine replacement therapy, provided smoking cessation education today, provided smoking cessation counseling, and provided cessation resources.  Follow-up next office visit office visit for assessment of smoking cessation.  Smoking cessation counseling advised for: 5 min     Observations/Objective:  Cardiac tests: Echo 05/24/16 >> Mod LVH, EF 60 to 65%, grade 2 DD  Pulmonary tests CT angio chest 05/23/16 >> peribronchial thickening ABG 05/23/16 >> pH 7.36, PCO2 50.5, PO2 67.2  03/11/2018-office spirometry- FVC 1.7 (41% predicted), ratio 67, FEV1 1.1 (35% predicted) >>> Severe obstruction  No results found for: NITRICOXIDE  Assessment and Plan:  Mucopurulent chronic bronchitis ([name]) Assessment: mMRC 1 today Office spirometry in March/2020 shows an FEV1 of 1.1 (35% predicted) with the patient taking Symbicort 160 Maintained well on Trelegy Ellipta at this time Patient denies wheezing or cough  Plan: Continue Trelegy Ellipta 69-month follow-up with Dr. Halford Chessman or Wyn Quaker FNP Needs formal pulmonary function testing in 3 months Okay to stop Singulair You need to stop smoking  Allergic rhinitis Assessment: Patient does not believe Singulair works Research scientist (medical) for daily antihistamines Is willing to try Flonase  Plan: Prescription sent for  Flonase Okay to stop Singulair Still recommend starting daily antihistamine or nasal saline rinses when you are able to financially purchase this  Tobacco abuse Assessment: Current every day smoker Smoking 0.5  packs/day 57 ppd No formal PFTs  Plan: Continue to work on decreasing her smoking We recommend stopping smoking Needs formal pulmonary function testing Follow-up in 3 months    Follow Up Instructions:  Return in about 3 months (around 07/08/2018), or if symptoms worsen or fail to improve, for Follow up with Dr. Halford Chessman, Follow up with Wyn Quaker FNP-C, Follow up for PFT.    I discussed the assessment and treatment plan with the patient. The patient was provided an opportunity to ask questions and all were answered. The patient agreed with the plan and demonstrated an understanding of the instructions.   The patient was advised to call back or seek an in-person evaluation if the symptoms worsen or if the condition fails to improve as anticipated.  I provided 25 minutes of non-face-to-face time during this encounter.   Lauraine Rinne, NP

## 2018-04-07 NOTE — Telephone Encounter (Signed)
Patient has had abdominal pain and a high lipase and negative CT scan.  It was recommended that she can leave off her Victoza until her next visit.  Also patient to be reminded to reschedule her canceled appointment.  This can be done via telehealth

## 2018-04-08 ENCOUNTER — Encounter: Payer: Self-pay | Admitting: Pulmonary Disease

## 2018-04-08 ENCOUNTER — Ambulatory Visit (INDEPENDENT_AMBULATORY_CARE_PROVIDER_SITE_OTHER): Payer: Medicare HMO | Admitting: Pulmonary Disease

## 2018-04-08 ENCOUNTER — Ambulatory Visit (INDEPENDENT_AMBULATORY_CARE_PROVIDER_SITE_OTHER): Payer: Medicare HMO | Admitting: Endocrinology

## 2018-04-08 ENCOUNTER — Encounter: Payer: Self-pay | Admitting: Endocrinology

## 2018-04-08 ENCOUNTER — Other Ambulatory Visit: Payer: Self-pay

## 2018-04-08 DIAGNOSIS — Z720 Tobacco use: Secondary | ICD-10-CM

## 2018-04-08 DIAGNOSIS — E1165 Type 2 diabetes mellitus with hyperglycemia: Secondary | ICD-10-CM

## 2018-04-08 DIAGNOSIS — Z794 Long term (current) use of insulin: Secondary | ICD-10-CM

## 2018-04-08 DIAGNOSIS — F17210 Nicotine dependence, cigarettes, uncomplicated: Secondary | ICD-10-CM

## 2018-04-08 DIAGNOSIS — J309 Allergic rhinitis, unspecified: Secondary | ICD-10-CM

## 2018-04-08 DIAGNOSIS — J411 Mucopurulent chronic bronchitis: Secondary | ICD-10-CM

## 2018-04-08 MED ORDER — FLUTICASONE PROPIONATE 50 MCG/ACT NA SUSP: 1.0000 | Freq: Every day | NASAL | 3 refills | Status: DC

## 2018-04-08 NOTE — Patient Instructions (Addendum)
Stop Singulair   Continue Trelegy Ellipta  >>> 1 puff daily in the morning >>>rinse mouth out after use  >>> This inhaler contains 3 medications that help manage her respiratory status, contact our office if you cannot afford this medication or unable to remain on this medication >>>samples provided today   Please start taking a daily antihistamine:  >>>choose one of: zyrtec, claritin, allegra, or xyzal  >>>these are over the counter medications  >>>can choose generic option  >>>take daily  >>>this medication helps with allergies, post nasal drip, and cough   Please start using Flonase 1 spray each nostril daily for allergy symptoms  Only use your albuterol as a rescue medication to be used if you can't catch your breath by resting or doing a relaxed purse lip breathing pattern.  - The less you use it, the better it will work when you need it. - Ok to use up to 2 puffs every 4 hours if you must but call for immediate appointment if use goes up over your usual need - Don't leave home without it !! (think of it like the spare tire for your car)    We recommend that you stop smoking.  >>>You need to set a quit date >>>If you have friends or family who smoke, let them know you are trying to quit and not to smoke around you or in your living environment   Smoking Cessation Resources:  1 800 QUIT NOW  >>> Patient to call this resource and utilize it to help support her quit smoking >>> Keep up your hard work with stopping smoking  You can also contact the [facility] >>>For smoking cessation classes call [phone]  We do not recommend using e-cigarettes as a form of stopping smoking  You can sign up for smoking cessation support texts and information:  >>>https://smokefree.gov/smokefreetxt    Note your daily symptoms >remember "red flags" for COPD:  >>>Increase in cough >>>increase in sputum production >>>increase in shortness of breath or  activity  intolerance.   If you notice these symptoms, please call the office to be seen.      Return in about 3 months (around 07/08/2018), or if symptoms worsen or fail to improve, for Follow up with Dr. Halford Chessman, Follow up with Wyn Quaker FNP-C, Follow up for PFT.    Coronavirus (COVID-19) Are you at risk?  Are you at risk for the Coronavirus (COVID-19)?  To be considered HIGH RISK for Coronavirus (COVID-19), you have to meet the following criteria:  . Traveled to [place], [place], [place], [place] or [place]; or in the [place] to [place], [place], [place], or [place]; and have fever, cough, and shortness of breath within the last 2 weeks of travel OR . Been in close contact with a person diagnosed with COVID-19 within the last 2 weeks and have fever, cough, and shortness of breath . IF YOU DO NOT MEET THESE CRITERIA, YOU ARE CONSIDERED LOW RISK FOR COVID-19.  What to do if you are HIGH RISK for COVID-19?  [name] Kitchen If you are having a medical emergency, call 911. . Seek medical care right away. Before you go to a doctor's office, urgent care or emergency department, call ahead and tell them about your recent travel, contact with someone diagnosed with COVID-19, and your symptoms. You should receive instructions from your physician's office regarding next steps of care.  . When you arrive at healthcare provider, tell the healthcare staff immediately you have returned  from visiting [place], [place], [place], [place] or [place]; or traveled in the [place] to [place], [place], [place], or [place]; in the last two weeks or you have been in close contact with a person diagnosed with COVID-19 in the last 2 weeks.   . Tell the health care staff about your symptoms: fever, cough and shortness of breath. . After you have been seen by a medical provider, you will be either: o Tested for (COVID-19) and discharged home on quarantine except to seek medical care if symptoms worsen, and  asked to  - Stay home and avoid contact with others until you get your results (4-5 days)  - Avoid travel on public transportation if possible (such as bus, train, or airplane) or o Sent to the Emergency Department by EMS for evaluation, COVID-19 testing, and possible admission depending on your condition and test results.  What to do if you are LOW RISK for COVID-19?  Reduce your risk of any infection by using the same precautions used for avoiding the common cold or flu:  [name] Kitchen Wash your hands often with soap and warm water for at least 20 seconds.  If soap and water are not readily available, use an alcohol-based hand sanitizer with at least 60% alcohol.  . If coughing or sneezing, cover your mouth and nose by coughing or sneezing into the elbow areas of your shirt or coat, into a tissue or into your sleeve (not your hands). . Avoid shaking hands with others and consider head nods or verbal greetings only. . Avoid touching your eyes, nose, or mouth with unwashed hands.  . Avoid close contact with people who are sick. . Avoid places or events with large numbers of people in one location, like concerts or sporting events. . Carefully consider travel plans you have or are making. . If you are planning any travel outside or inside the [place], visit the CDC's Travelers' Health webpage for the latest health notices. . If you have some symptoms but not all symptoms, continue to monitor at home and seek medical attention if your symptoms worsen. . If you are having a medical emergency, call 911.   [place] / e-Visit: [email or web address]         [facility]: [facility]: [phone]                   [facility]: [phone]           It is flu season:   >>> Best ways to protect herself from the flu: Receive the yearly flu vaccine,  practice good hand hygiene washing with soap and also using hand sanitizer when available, eat a nutritious meals, get adequate rest, hydrate appropriately   Please contact the office if your symptoms worsen or you have concerns that you are not improving.   Thank you for choosing [facility] for your healthcare, and for allowing [place] to partner with you on your healthcare journey. I am thankful to be able to provide care to you today.   Wyn Quaker FNP-C    Health Risks of Smoking Smoking cigarettes is very bad for your health. Tobacco smoke has over 200 known poisons in it. It contains the poisonous gases nitrogen oxide and carbon monoxide. There are over 60 chemicals in tobacco smoke that cause cancer. Smoking is difficult to quit because a chemical in tobacco, called nicotine, causes  addiction or dependence. When you smoke and inhale, nicotine is absorbed rapidly into the bloodstream through your lungs. Both inhaled and non-inhaled nicotine may be addictive. What are the risks of cigarette smoke? Cigarette smokers have an increased risk of many serious medical problems, including:  Lung cancer.  Lung disease, such as pneumonia, bronchitis, and emphysema.  Chest pain (angina) and heart attack because the heart is not getting enough oxygen.  Heart disease and peripheral blood vessel disease.  High blood pressure (hypertension).  Stroke.  Oral cancer, including cancer of the lip, mouth, or voice box.  Bladder cancer.  Pancreatic cancer.  Cervical cancer.  Pregnancy complications, including premature birth.  Stillbirths and smaller newborn babies, birth defects, and genetic damage to sperm.  Early menopause.  Lower estrogen level for women.  Infertility.  Facial wrinkles.  Blindness.  Increased risk of broken bones (fractures).  Senile dementia.  Stomach ulcers and internal bleeding.  Delayed wound healing and increased risk of complications during  surgery.  Even smoking lightly shortens your life expectancy by several years. Because of secondhand smoke exposure, children of smokers have an increased risk of the following:  Sudden infant death syndrome (SIDS).  Respiratory infections.  Lung cancer.  Heart disease.  Ear infections. What are the benefits of quitting? There are many health benefits of quitting smoking. Here are some of them:  Within days of quitting smoking, your risk of having a heart attack decreases, your blood flow improves, and your lung capacity improves. Blood pressure, pulse rate, and breathing patterns start returning to normal soon after quitting.  Within months, your lungs may clear up completely.  Quitting for 10 years reduces your risk of developing lung cancer and heart disease to almost that of a nonsmoker.  People who quit may see an improvement in their overall quality of life. How do I quit smoking?     Smoking is an addiction with both physical and psychological effects, and longtime habits can be hard to change. Your health care provider can recommend:  Programs and community resources, which may include group support, education, or talk therapy.  Prescription medicines to help reduce cravings.  Nicotine replacement products, such as patches, gum, and nasal sprays. Use these products only as directed. Do not replace cigarette smoking with electronic cigarettes, which are commonly called e-cigarettes. The safety of e-cigarettes is not known, and some may contain harmful chemicals.  A combination of two or more of these methods. Where to find more information  American Lung Association: www.lung.org  American Cancer Society: www.cancer.org Summary  Smoking cigarettes is very bad for your health. Cigarette smokers have an increased risk of many serious medical problems, including several cancers, heart disease, and stroke.  Smoking is an addiction with both physical and psychological  effects, and longtime habits can be hard to change.  By stopping right away, you can greatly reduce the risk of medical problems for you and your family.  To help you quit smoking, your health care provider can recommend programs, community resources, prescription medicines, and nicotine replacement products such as patches, gum, and nasal sprays. This information is not intended to replace advice given to you by your health care provider. Make sure you discuss any questions you have with your health care provider. Document Released: 01/26/2004 Document Revised: 03/21/2017 Document Reviewed: 12/23/2015 Elsevier Interactive Patient Education  2019 [employer] Inc[employer]      Chronic Obstructive Pulmonary Disease Chronic obstructive pulmonary disease (COPD) is a long-term (chronic) lung problem. When you  have COPD, it is hard for air to get in and out of your lungs. Usually the condition gets worse over time, and your lungs will never return to normal. There are things you can do to keep yourself as healthy as possible.  Your doctor may treat your condition with: ? Medicines. ? Oxygen. ? Lung surgery.  Your doctor may also recommend: ? Rehabilitation. This includes steps to make your body work better. It may involve a team of specialists. ? Quitting smoking, if you smoke. ? Exercise and changes to your diet. ? Comfort measures (palliative care). Follow these instructions at home: Medicines  Take over-the-counter and prescription medicines only as told by your doctor.  Talk to your doctor before taking any cough or allergy medicines. You may need to avoid medicines that cause your lungs to be dry. Lifestyle  If you smoke, stop. Smoking makes the problem worse. If you need help quitting, ask your doctor.  Avoid being around things that make your breathing worse. This may include smoke, chemicals, and fumes.  Stay active, but remember to rest as well.  Learn and use tips on how to  relax.  Make sure you get enough sleep. Most adults need at least 7 hours of sleep every night.  Eat healthy foods. Eat smaller meals more often. Rest before meals. Controlled breathing Learn and use tips on how to control your breathing as told by your doctor. Try:  Breathing in (inhaling) through your nose for 1 second. Then, pucker your lips and breath out (exhale) through your lips for 2 seconds.  Putting one hand on your belly (abdomen). Breathe in slowly through your nose for 1 second. Your hand on your belly should move out. Pucker your lips and breathe out slowly through your lips. Your hand on your belly should move in as you breathe out.  Controlled coughing Learn and use controlled coughing to clear mucus from your lungs. Follow these steps: 1. Lean your head a little forward. 2. Breathe in deeply. 3. Try to hold your breath for 3 seconds. 4. Keep your mouth slightly open while coughing 2 times. 5. Spit any mucus out into a tissue. 6. Rest and do the steps again 1 or 2 times as needed. General instructions  Make sure you get all the shots (vaccines) that your doctor recommends. Ask your doctor about a flu shot and a pneumonia shot.  Use oxygen therapy and pulmonary rehabilitation if told by your doctor. If you need home oxygen therapy, ask your doctor if you should buy a tool to measure your oxygen level (oximeter).  Make a COPD action plan with your doctor. This helps you to know what to do if you feel worse than usual.  Manage any other conditions you have as told by your doctor.  Avoid going outside when it is very hot, cold, or humid.  Avoid people who have a sickness you can catch (contagious).  Keep all follow-up visits as told by your doctor. This is important. Contact a doctor if:  You cough up more mucus than usual.  There is a change in the color or thickness of the mucus.  It is harder to breathe than usual.  Your breathing is faster than usual.  You  have trouble sleeping.  You need to use your medicines more often than usual.  You have trouble doing your normal activities such as getting dressed or walking around the house. Get help right away if:  You have shortness of breath  while resting.  You have shortness of breath that stops you from: ? Being able to talk. ? Doing normal activities.  Your chest hurts for longer than 5 minutes.  Your skin color is more blue than usual.  Your pulse oximeter shows that you have low oxygen for longer than 5 minutes.  You have a fever.  You feel too tired to breathe normally. Summary  Chronic obstructive pulmonary disease (COPD) is a long-term lung problem.  The way your lungs work will never return to normal. Usually the condition gets worse over time. There are things you can do to keep yourself as healthy as possible.  Take over-the-counter and prescription medicines only as told by your doctor.  If you smoke, stop. Smoking makes the problem worse. This information is not intended to replace advice given to you by your health care provider. Make sure you discuss any questions you have with your health care provider. Document Released: 06/06/2007 Document Revised: 01/23/2016 Document Reviewed: 01/23/2016 Elsevier Interactive Patient Education  2019 [employer].

## 2018-04-08 NOTE — Assessment & Plan Note (Signed)
Assessment: Patient does not believe Singulair works Research scientist (medical) for daily antihistamines Is willing to try Flonase  Plan: Prescription sent for [employer] to stop Singulair Still recommend starting daily antihistamine or nasal saline rinses when you are able to financially purchase this

## 2018-04-08 NOTE — Progress Notes (Addendum)
Patient ID: [name], female   DOB: 07-01-1965, 53 y.o.   MRN: [id number]           Reason for Appointment: Follow-up  for Type 2 Diabetes   Today's office visit was provided via telemedicine using video technique Video connection was interrupted during the visit and was completed with telephone conversation . Consent for the patient has been obtained . Location of the patient: Home . Location of the provider: Office Only the patient and myself were participating in the encounter   History of Present Illness:          Date of diagnosis of type 2 diabetes mellitus: 2000       Background history:     The patient was apparently diagnosed incidentally with her diabetes 16 years ago 16 years ago, was asymptomatic. She took metformin and possibly other oral hypoglycemic drugs but subsequently went on insulin in 2005 She was taking various insulin regimens in the past  She was being treated by an endocrinologist some time ago and he switched her To Toujeo and also tried her on Byetta.  She thinks that she had better blood sugars with Byetta but she did not follow-up  Novolog was switched to U-500 insulin in 3/17   Recent history:   INSULIN regimen is:  Toujeo 60 units bid  Humulin R U-500: 45 units around 10-11 am, 35 units before lunch and 25 at suppertime  Non-insulin hypoglycemic drugs the patient is taking are: was on Victoza 1.44m    Her A1c has been persistently high and last 8.6 She was last seen in 1/20  Current management, blood sugar patterns and problems identified:  She is unable to give her blood sugar readings by time and date on her meter since she has visual problems  She thinks her blood sugars were relatively better in the mornings are 130+ but since stopping the Victoza after her episode of pancreatitis this month her blood sugars are higher  However she tends to have readings up to 250 after meals and at times over 200 in the mornings also  Has been  told to cut back on her sweets but she will occasionally have this and may take some NovoLog at that time at bedtime  She was told to take her Humulin R about 30 minutes before eating but she may not always remember to do so  She was also told to cut back on her Toujeo down to 50 to avoid overnight hypoglycemia but she has gone back up to 60 units twice a day  Only rarely has had hypoglycemia symptoms at bedtime but not recently  Lab glucose done through PCP was 192 about 4 days ago    Side effects from medications have been: None  Compliance with the medical regimen:  fair  Glucose monitoring:  done about 1-2 times a day       Glucometer: Accu-Chek    Blood Glucose readings from recall:   PRE-MEAL Fasting Lunch Dinner Bedtime Overall  Glucose range:  135-200+   180-200s    Mean/median:        POST-MEAL PC Breakfast PC Lunch PC Dinner  Glucose range:    180  Mean/median:       Self-care:   Typical meal intake: Breakfast is mostly a yogurt, fruit; or egg/toast  First meal at about 11 AM, has mixed meal at dinnertime 6 pm, usually 2 meals a day Has variable amounts of snacks.  Dietician visit, most recent: At [facility]               Exercise:  none  Weight history:  Wt Readings from Last 3 Encounters:  03/11/18 243 lb 6.4 oz (110.4 kg)  01/24/18 246 lb (111.6 kg)  01/02/18 236 lb (107 kg)    Glycemic control: Baseline A1c is 11 in 8/16   Lab Results  Component Value Date   HGBA1C 8.6 (H) 01/21/2018   HGBA1C 7.2 (H) 12/29/2017   HGBA1C 8.3 (H) 07/29/2017   Lab Results  Component Value Date   MICROALBUR 1.2 03/05/2016   LDLCALC 147 (H) 05/21/2017   CREATININE 1.36 (H) 01/21/2018   No visits with results within 1 Week(s) from this visit.  Latest known visit with results is:  Lab on 01/21/2018  Component Date Value Ref Range Status  . Cholesterol 01/21/2018 161  0 - 200 mg/dL Final   ATP III Classification       Desirable:  < 200 mg/dL                Borderline High:  200 - 239 mg/dL          High:  > = 240 mg/dL  . Triglycerides 01/21/2018 234.0* 0.0 - 149.0 mg/dL Final   Normal:  <150 mg/dLBorderline High:  150 - 199 mg/dL  . HDL 01/21/2018 47.60  >39.00 mg/dL Final  . VLDL 01/21/2018 46.8* 0.0 - 40.0 mg/dL Final  . Total CHOL/HDL Ratio 01/21/2018 3   Final                  Men          Women1/2 Average Risk     3.4          3.3Average Risk          5.0          4.42X Average Risk          9.6          7.13X Average Risk          15.0          11.0                      . NonHDL 01/21/2018 113.01   Final   NOTE:  Non-HDL goal should be 30 mg/dL higher than patient's LDL goal (i.e. LDL goal of < 70 mg/dL, would have non-HDL goal of < 100 mg/dL)  . Sodium 01/21/2018 135  135 - 145 mEq/L Final  . Potassium 01/21/2018 4.9  3.5 - 5.1 mEq/L Final  . Chloride 01/21/2018 97  96 - 112 mEq/L Final  . CO2 01/21/2018 30  19 - 32 mEq/L Final  . Glucose, Bld 01/21/2018 291* 70 - 99 mg/dL Final  . BUN 01/21/2018 36* 6 - 23 mg/dL Final  . Creatinine, Ser 01/21/2018 1.36* 0.40 - 1.20 mg/dL Final  . Calcium 01/21/2018 9.6  8.4 - 10.5 mg/dL Final  . GFR 01/21/2018 40.55* >60.00 mL/min Final  . Hgb A1c MFr Bld 01/21/2018 8.6* 4.6 - 6.5 % Final   Glycemic Control Guidelines for People with Diabetes:Non Diabetic:  <6%Goal of Therapy: <7%Additional Action Suggested:  >8%   . Direct LDL 01/21/2018 91.0  mg/dL Final   Optimal:  <100 mg/dLNear or Above Optimal:  100-129 mg/dLBorderline High:  130-159 mg/dLHigh:  160-189 mg/dLVery High:  >190 mg/dL    Other active problems: See review of systems  Allergies as of 04/08/2018      Reactions   Cortizone-10 [hydrocortisone] Rash   Per patient, given local injection at knee and developed rash at local site.       Medication List       Accurate as of April 08, 2018  3:05 PM. Always use your most recent med list.        albuterol 108 (90 Base) MCG/ACT inhaler Commonly known as:  PROVENTIL  HFA;VENTOLIN HFA INHALE 2 PUFFS INTO THE LUNGS EVERY 6 HOURS AS NEEDED FOR WHEEZING OR SHORTNESS OF BREATH   diphenoxylate-atropine 2.5-0.025 MG tablet Commonly known as:  Lomotil Take 1 tablet by mouth 4 (four) times daily as needed for diarrhea or loose stools.   fenofibrate 145 MG tablet Commonly known as:  TRICOR Take 1 tablet (145 mg total) by mouth daily.   fluticasone 50 MCG/ACT nasal spray Commonly known as:  FLONASE Place 1 spray into both nostrils daily.   fluticasone furoate-vilanterol 100-25 MCG/INH Aepb Commonly known as:  Breo Ellipta INHALE 1 PUFF INTO THE LUNGS DAILY   Fluticasone-Umeclidin-Vilant 100-62.5-25 MCG/INH Aepb Commonly known as:  Trelegy Ellipta Inhale 1 puff into the lungs daily.   Genvoya 150-150-200-10 MG Tabs tablet Generic drug:  elvitegravir-cobicistat-emtricitabine-tenofovir TAKE 1 TABLET BY MOUTH DAILY   glucose blood test strip Commonly known as:  Accu-Chek Aviva Use as instructed to check blood sugar 2 times daily.   HumuLIN R U-500 KwikPen 500 UNIT/ML kwikpen Generic drug:  insulin regular human CONCENTRATED INJECT 45 UNITS UNDER THE SKIN AT BREAKFAST, 30 UNITS AT LUNCH, AND 15 UNITS BEFORE SNACKS   Insulin Glargine (2 Unit Dial) 300 UNIT/ML Sopn Commonly known as:  Toujeo Max SoloStar Inject 50 Units into the skin 2 (two) times daily. INJECT 50 UNITS UNDER THE SKIN TWICE DAILY.   ipratropium-albuterol 0.5-2.5 (3) MG/3ML Soln Commonly known as:  DUONEB Take 3 mLs by nebulization every 6 (six) hours.   MAGnesium-Oxide 400 (241.3 Mg) MG tablet Generic drug:  magnesium oxide Take 400 mg by mouth daily.   montelukast 10 MG tablet Commonly known as:  SINGULAIR Take 1 tablet (10 mg total) by mouth at bedtime.   pantoprazole 40 MG tablet Commonly known as:  PROTONIX Take 1 tablet (40 mg total) by mouth daily.   pregabalin 225 MG capsule Commonly known as:  LYRICA Take 225 mg by mouth 2 (two) times daily. TAKE 1 CAPSULE (225MG)  BY MOUTH TWICE DAILY.   rosuvastatin 20 MG tablet Commonly known as:  CRESTOR Take 1 tablet (20 mg total) by mouth daily. Reported on 05/02/2015       Allergies:  Allergies  Allergen Reactions  . Cortizone-10 [Hydrocortisone] Rash    Per patient, given local injection at knee and developed rash at local site.     Past Medical History:  Diagnosis Date  . Acute metabolic encephalopathy 9/47/0962  . Acute on chronic respiratory failure with hypoxia ([place]) 05/19/2016  . Anxiety   . ARF (acute renal failure) ([place]) 05/03/2014  . Arthritis    LEFT HIP  . Asthma    HOSPITALIZED WITH EXCERBATION OF ASTHMA - AND BRONCHITIS AND THE FLU DEC 2013  . Asthma exacerbation attacks 12/28/2011  . Bronchitis 12/28/2011  . CAP (community acquired pneumonia) 07/25/2012  . Chronic kidney disease (CKD), stage IV (severe) ([place]) 11/10/2014  . Class 3 obesity due to excess calories with serious comorbidity and body mass index (BMI) of 40.0 to 44.9 in adult   . Colles' fracture of left  radius   . COPD (chronic obstructive pulmonary disease) ([place])   . Depression   . Diabetes mellitus    ON INSULIN AND ORAL MEDICATIONS  . Diabetes mellitus type 2 with complications, uncontrolled ([facility]) 04/19/2008   Qualifier: Diagnosis of  By: Tomma Lightning MD, Claiborne Billings     . Diabetic foot infection ([place]) 11/09/2014  . Diabetic ketoacidosis without coma associated with type 2 diabetes mellitus ([place])   . Diabetic neuropathy ([place]) 11/10/2014  . Diarrhea 08/30/2008   Qualifier: Diagnosis of  By: Tomma Lightning MD, Claiborne Billings    . Diastolic dysfunction 37/03/4285  . Difficult intravenous access   . DKA, type 2 ([place]) 04/04/2016  . Dyslipidemia 12/23/2008   Qualifier: Diagnosis of  By: Tomma Lightning MD, Claiborne Billings    . Essential hypertension 04/19/2008   Qualifier: Diagnosis of  By: Tomma Lightning MD, Claiborne Billings    . FATIGUE 07/12/2008   Qualifier: Diagnosis of  By: Tomma Lightning MD, Claiborne Billings    . Femoral neck fracture ([place]) 07/16/2011  . Femur fracture, left ([place]) 07/08/2014  . Fever    . Fracture of distal femur ([place]) 07/08/2014  . GERD (gastroesophageal reflux disease)   . Hereditary and idiopathic peripheral neuropathy 04/19/2008   Qualifier: Diagnosis of  By: Tomma Lightning MD, Claiborne Billings    . Hip fracture, left ([place]) 07/16/2011  . Hip pain   . HIV infection ([place]) 2000  . Human immunodeficiency virus (HIV) disease ([place]) 04/19/2008   [id number] +   . Hyperlipidemia   . Hypertension   . Hyponatremia 12/29/2011  . Influenza A 12/29/2011  . Influenza B   . Insomnia 06/12/2016  . Left hip pain 05/03/2014  . Mood disorder ([place]) 04/19/2008   Qualifier: Diagnosis of  By: Tomma Lightning MD, Claiborne Billings    . Neuropathy    NEUROPATHY HANDS AND FEET  . NSTEMI (non-ST elevated myocardial infarction) ([place]) 05/19/2016  . Obesity hypoventilation syndrome ([place])   . Osteoporosis 07/08/2014  . Pain    SEVERE PAIN LEFT HIP - HX OF LEFT HIP PINNING JULY 2013  . Perimenopausal symptoms 02/13/2012   LMP around 2011. On estrace and provera since around 2012 for hot flashes, mood swings. Estrace 2 mg daily, Provera 2.5 mg for 5 days each month.   . Repeated falls 07/30/2011  . Sepsis ([place]) 04/04/2016  . Shortness of breath    ALLERGIES ARE "ACTING UP"  . SOB (shortness of breath)   . THRUSH 05/20/2008   Qualifier: Diagnosis of  By: Tomma Lightning MD, Claiborne Billings      . Tobacco use disorder 04/14/2010    Past Surgical History:  Procedure Laterality Date  . AMPUTATION Left 11/18/2014   Procedure: LEFT BELOW KNEE AMPUTATION;  Surgeon: Leandrew Koyanagi, MD;  Location: [place];  Service: Orthopedics;  Laterality: Left;  . CAST APPLICATION Left 06/09/1155   Procedure: CAST APPLICATION (FIBERGLASS);  Surgeon: Latanya Maudlin, MD;  Location: [facility];  Service: Orthopedics;  Laterality: Left;  CLOSED REDUCTION OF LEFT COLLES FRACTURE WITH SHORT ARM CAST  . HARDWARE REMOVAL Left 05/05/2014   Procedure: HARDWARE REMOVAL LEFT HIP;  Surgeon: Latanya Maudlin, MD;  Location: [facility];  Service: Orthopedics;  Laterality: Left;  REMOVAL BIOMET 6.5-8.0 CANNULATED  SCREW  . HIP ARTHROPLASTY Left 05/05/2014   Procedure: ARTHROPLASTY OPEN REDUCTION INTERNAL FIXATION LEFT HIP AND REMOVAL OF TWO CANNULATED SCREW;  Surgeon: Latanya Maudlin, MD;  Location: [facility];  Service: Orthopedics;  Laterality: Left;  . HIP PINNING,CANNULATED  07/16/2011   Procedure: CANNULATED HIP PINNING;  Surgeon: Gearlean Alf, MD;  Location: [facility];  Service: Orthopedics;  Laterality: Left;  . HIP PINNING,CANNULATED Left 04/09/2012   Procedure: CANNULATED HIP PINNING AND HARDWARE REVISION;  Surgeon: Gearlean Alf, MD;  Location: [facility];  Service: Orthopedics;  Laterality: Left;  . I&D EXTREMITY Left 11/16/2014   Procedure:  DEBRIDEMENT OF LEFT FOOT POSSIBLE APPLICATION OF INTEGRIA AND VAC ;  Surgeon: Irene Limbo, MD;  Location: [place];  Service: Plastics;  Laterality: Left;  . PERIPHERAL VASCULAR CATHETERIZATION N/A 11/15/2014   Procedure: Abdominal Aortogram;  Surgeon: Conrad [place], MD;  Location: [facility] [facility];  Service: Cardiovascular;  Laterality: N/A;  . PERIPHERAL VASCULAR CATHETERIZATION  11/15/2014   Procedure: Lower Extremity Angiography;  Surgeon: Conrad [place], MD;  Location: [facility];  Service: Cardiovascular;;  . PERIPHERAL VASCULAR CATHETERIZATION Left 11/15/2014   Procedure: Peripheral Vascular Intervention;  Surgeon: Conrad , MD;  Location: [place] [facility];  Service: Cardiovascular;  Laterality: Left;  popliteal artery stenting    Family History  Problem Relation Age of Onset  . Diabetes Mother   . Hypertension Mother   . Vision loss Mother   . Heart disease Mother   . Hypertension Father   . Thyroid disease Neg Hx     Social History:  reports that she has been smoking e-cigarettes and cigarettes. She started smoking about 37 years ago. She has a 57.00 pack-year smoking history. She has never used smokeless tobacco. She reports that she does not drink alcohol or use drugs.    Review of Systems   RENAL dysfunction: Variable and last  creatinine was 1.25 done on 04/04/2018 She does not take any diuretics currently but has Lasix to take as needed   Lab Results  Component Value Date   CREATININE 1.36 (H) 01/21/2018   BUN 36 (H) 01/21/2018   NA 135 01/21/2018   K 4.9 01/21/2018   CL 97 01/21/2018   CO2 30 01/21/2018     Lipid history: Has been treated with  Fenofibrate and Crestor by PCP LDL is improved    Lab Results  Component Value Date   CHOL 161 01/21/2018   HDL 47.60 01/21/2018   LDLCALC 147 (H) 05/21/2017   LDLDIRECT 91.0 01/21/2018   TRIG 234.0 (H) 01/21/2018   CHOLHDL 3 01/21/2018           She takes Lyrica for phantom pain  Blood pressure has been variable, currently not on antihypertensives or ACE inhibitor Last blood pressure was 120/82 done by PCP  BP Readings from Last 3 Encounters:  03/11/18 130/68  01/24/18 120/74  01/05/18 118/88   Pancreatitis: She had abdominal pain and high lipase but no abnormality on CT scan of abdomen done on 04/01/2018   Physical Examination:  LMP 11/16/2010       ASSESSMENT:  Diabetes type 2, uncontrolled with obesity  See history of present illness for detailed discussion of current diabetes management, blood sugar patterns and problems identified   A1c is 8.6 last  She is on a regimen of Toujeo twice daily and Humulin R U-500 insulin now 3 times a day, appears to be insulin resistant Her Victoza has been stopped empirically since she had an episode of pancreatitis She thinks her blood sugars are higher with stopping Victoza which may have also been helping her with some portion control Difficult to analyze her blood sugar readings since she cannot read her meter date and time and she was not able to come to the office today  A1c is persistently high over 8% in the past  Blood sugar targets at various times and timing of glucose monitoring was discussed today She thinks she is checking blood sugars 3 times a day but this needs to be confirmed  Currently with her blood sugars mostly over 200 will need to increase her insulin  She is also a good candidate for an SGLT2 drug    PLAN:   She will be given a trial of Invokana 100 mg daily Discussed action of SGLT 2 drugs on lowering glucose by decreasing kidney absorption of glucose, benefits of weight loss and lower blood pressure, possible side effects including candidiasis and dosage regimen  Discussed precautions such as increasing fluid intake and genital hygiene to avoid side effects and also to make sure she does not take the Invokana on the days she is having significant nausea Need to check her sugars more consistently including after meals She will increase her insulin doses with the Humulin R by 10 units for each dose However if her blood sugars come down significantly with Invokana she will back down again 10 units at least on her Humulin R U-500 She will call if she has any difficulties with hyperglycemia or hypoglycemia Follow-up in 6 weeks   There are no Patient Instructions on file for this visit.  Total time spent for visit =12 minutes  Elayne Snare 04/08/2018, 3:05 PM   Note: This office note was prepared with Dragon voice recognition system technology. Any transcriptional errors that result from this process are unintentional.

## 2018-04-08 NOTE — Assessment & Plan Note (Signed)
Assessment: mMRC 1 today Office spirometry in March/2020 shows an FEV1 of 1.1 (35% predicted) with the patient taking Symbicort 160 Maintained well on Trelegy Ellipta at this time Patient denies wheezing or cough  Plan: Continue Trelegy Ellipta 5-month follow-up with Dr. Halford Chessman or Wyn Quaker FNP Needs formal pulmonary function testing in 3 months Okay to stop Singulair You need to stop smoking

## 2018-04-08 NOTE — Assessment & Plan Note (Signed)
Assessment: Current every day smoker Smoking 0.5 packs/day 57 ppd No formal PFTs  Plan: Continue to work on decreasing her smoking We recommend stopping smoking Needs formal pulmonary function testing Follow-up in 3 months

## 2018-04-09 ENCOUNTER — Telehealth: Payer: Self-pay | Admitting: Endocrinology

## 2018-04-09 ENCOUNTER — Other Ambulatory Visit: Payer: Self-pay | Admitting: Endocrinology

## 2018-04-09 MED ORDER — CANAGLIFLOZIN 100 MG PO TABS: ORAL_TABLET | ORAL | 3 refills | Status: DC

## 2018-04-09 NOTE — Addendum Note (Signed)
Addended by: Elayne Snare on: 04/09/2018 11:33 AM   Modules accepted: Level of Service

## 2018-04-09 NOTE — Telephone Encounter (Signed)
What dose and instructions?

## 2018-04-09 NOTE — Telephone Encounter (Signed)
Patient stated that DR Dwyane Dee would be calling her in some [place] but the pharmacy has not received this prescription         [place], [place]

## 2018-04-09 NOTE — Telephone Encounter (Signed)
Invokana has been called in

## 2018-04-10 ENCOUNTER — Ambulatory Visit: Payer: Self-pay | Admitting: Pulmonary Disease

## 2018-04-13 ENCOUNTER — Other Ambulatory Visit: Payer: Self-pay | Admitting: Endocrinology

## 2018-04-17 DIAGNOSIS — D72829 Elevated white blood cell count, unspecified: Secondary | ICD-10-CM | POA: Diagnosis not present

## 2018-04-17 DIAGNOSIS — R748 Abnormal levels of other serum enzymes: Secondary | ICD-10-CM | POA: Diagnosis not present

## 2018-04-25 ENCOUNTER — Telehealth: Payer: Self-pay | Admitting: Pulmonary Disease

## 2018-04-25 MED ORDER — LEVOCETIRIZINE DIHYDROCHLORIDE 5 MG PO TABS: ORAL_TABLET | ORAL | 12 refills | Status: DC

## 2018-04-25 NOTE — Telephone Encounter (Signed)
Called the patient and had to repeat multiple times to take only one of the medications not both at the same time. Patient stated that she will continue to take the Trelegy Ellipta.  Patient declined the Nystatin being sent to the pharmacy as she still has some at home.  Patient asked if she can get a prescription for Xyzal.  Message forwarded back to Wyn Quaker, NP.   Aaron Edelman, can she get the prescription for Xyzal sent? If so, how many refills?

## 2018-04-25 NOTE — Telephone Encounter (Signed)
Called and left detailed message on VM (DPR) to let patient know Xyzal was sent to pharmacy with 12 refills by Aaron Edelman, NP today, and to call if anything further is needed.

## 2018-04-25 NOTE — Telephone Encounter (Signed)
Correct should not be used in unison. She can be on one or the other.   Symbicort 160 >>> 2 puffs in the morning right when you wake up, rinse out your mouth after use, 12 hours later 2 puffs, rinse after use >>> Take this daily, no matter what >>> This is not a rescue inhaler   OR   Trelegy Ellipta  >>> 1 puff daily in the morning >>>rinse mouth out after use  >>> This inhaler contains 3 medications that help manage her respiratory status, contact our office if you cannot afford this medication or unable to remain on this medication    Patient needs to choose.   Can offer patient:   Nystatin 500,000 units suspension /100,000 units/mL >>>5 mL's every 6 hours for 7 days >>>Try to retain nystatin in mouth as long as possible  Place the order.  For sore throat as she is taking two ICS inhalers which can greatly increase her risk of thrush.   Please get patient scheduled for 2-4 week in office visit with me  Wyn Quaker FNP

## 2018-04-25 NOTE — Telephone Encounter (Signed)
Primary Pulmonologist: Dr. Chesley Mires Last office visit and with whom: 04/08/2018 w/ BPM  What do we see them for (pulmonary problems): Mucopurulent chronic bronchitis, allergic rhinitis  Reason for call: Pt states her Trelegy inhaler (takes 1 puff daily in the AM) has been "making her throat burn" for over a week now. States she feels like she can't cough or breathe and is having "bad allergies." Denies fever/chills/sweats. Was previously on Symbicort, which she states has helped "open her up". Also taking Flonase, which she states helps, however, she would like to have Xyzal called in via prescription because her pharmacy stated insurance is currently covering it.   In the last month, have you been in contact with someone who was confirmed or suspected to have Conoravirus / COVID-19?  No  Do you have any of the following symptoms developed in the last 30 days? Fever: No Cough: Yes Shortness of breath: Yes  When did your symptoms start?  Over 1 week ago.  If the patient has a fever, what is the last reading?  (use n/a if patient denies fever)  N/A . IF THE PATIENT STATES THEY DO NOT OWN A THERMOMETER, THEY MUST GO AND PURCHASE ONE When did the fever start?: N/A Have you taken any medication to suppress a fever (ie Ibuprofen, Aleve, Tylenol)?: N/A  Aaron Edelman, please advise on your recommendations for this pt. Thank you.

## 2018-04-25 NOTE — Telephone Encounter (Signed)
Unsure why patient is confused.  [name] what he has already talked to the patient earlier today to update her that she will receive the Xyzal and Advil I believe it has already been sent to her pharmacy.  See EW's earlier telephone note.  Wyn Quaker, FNP

## 2018-04-25 NOTE — Telephone Encounter (Signed)
Odd that now all of a sudden she started to have symptoms with Trelegy Ellipta.  Is the patient rinsing out her mouth after using the trelegy?  Have the patient look in the mirror while she opens up her mouth and see if she is noticing large white patches in the back of her throat.  If so she may have thrush which may need to be treated.  I would prefer to keep the patient on Trelegy Ellipta if at all possible as this is a better inhaler with 3 medications in it versus Symbicort which has 2 medications in it for management of her breathing.  Okay to prescribe Xyzal 5 mg Take 1 tablet daily for allergies 30 tablets,12 refills  Sent to pharmacy of choice  Wyn Quaker, FNP

## 2018-04-25 NOTE — Telephone Encounter (Signed)
Called & spoke w/ pt regarding BPM's recommendations.   Pt states she is rinsing her mouth after each use of Trelegy Ellipta. Pt denies noticing white patches in the back of her throat. Stated she's had thrush in the past and she does not have it now.  Order for Xyzal 5 mg 30 tab x12 refills sent to pt's preferred pharmacy [facility] [place]. Informed pt of this and she verbalized understanding.  Pt now states she is currently using Symbicort in addition to Trelegy and her rescue inhaler. States she has to turn to Symbicort to help "open her up." Informed pt I would f/u w/ Aaron Edelman about this and get back with her. Pt expressed understanding.  BPM, please advise. If I remember correctly, Trelegy and Symbicort are not meant to be used in unison. Thank you.

## 2018-04-26 ENCOUNTER — Other Ambulatory Visit: Payer: Self-pay | Admitting: Infectious Diseases

## 2018-04-28 DIAGNOSIS — H1045 Other chronic allergic conjunctivitis: Secondary | ICD-10-CM | POA: Diagnosis not present

## 2018-04-28 DIAGNOSIS — H40031 Anatomical narrow angle, right eye: Secondary | ICD-10-CM | POA: Diagnosis not present

## 2018-04-29 DIAGNOSIS — Z89512 Acquired absence of left leg below knee: Secondary | ICD-10-CM | POA: Diagnosis not present

## 2018-05-01 ENCOUNTER — Other Ambulatory Visit: Payer: Self-pay | Admitting: Endocrinology

## 2018-05-01 ENCOUNTER — Other Ambulatory Visit: Payer: Medicare HMO

## 2018-05-01 ENCOUNTER — Other Ambulatory Visit: Payer: Self-pay

## 2018-05-01 DIAGNOSIS — B20 Human immunodeficiency virus [HIV] disease: Secondary | ICD-10-CM | POA: Diagnosis not present

## 2018-05-02 ENCOUNTER — Telehealth: Payer: Self-pay | Admitting: Endocrinology

## 2018-05-02 ENCOUNTER — Other Ambulatory Visit: Payer: Medicare HMO

## 2018-05-02 LAB — T-HELPER CELL (CD4) - (RCID CLINIC ONLY)
CD4 % Helper T Cell: 22 % — ABNORMAL LOW (ref 33–65)
CD4 T Cell Abs: 267 /uL — ABNORMAL LOW (ref 400–1790)

## 2018-05-02 NOTE — Telephone Encounter (Signed)
Rx sent today already

## 2018-05-02 NOTE — Telephone Encounter (Signed)
MEDICATION: Humalin  PHARMACY:  [facility] on [address]  IS THIS A 90 DAY SUPPLY :   IS PATIENT OUT OF MEDICATION: no  IF NOT; HOW MUCH IS LEFT: 1 Dose Left.  LAST APPOINTMENT DATE: @4 /30/2020  NEXT APPOINTMENT DATE:@5 /26/2020  DO WE HAVE YOUR PERMISSION TO LEAVE A DETAILED MESSAGE: yes  OTHER COMMENTS:    **Let patient know to contact pharmacy at the end of the day to make sure medication is ready. **  ** Please notify patient to allow 48-72 hours to process**  **Encourage patient to contact the pharmacy for refills or they can request refills through [facility]**

## 2018-05-06 LAB — COMPREHENSIVE METABOLIC PANEL
AG Ratio: 1.7 (calc) (ref 1.0–2.5)
ALT: 16 U/L (ref 6–29)
AST: 11 U/L (ref 10–35)
Albumin: 4 g/dL (ref 3.6–5.1)
Alkaline phosphatase (APISO): 59 U/L (ref 37–153)
BUN/Creatinine Ratio: 21 (calc) (ref 6–22)
BUN: 30 mg/dL — ABNORMAL HIGH (ref 7–25)
CO2: 32 mmol/L (ref 20–32)
Calcium: 9.5 mg/dL (ref 8.6–10.4)
Chloride: 102 mmol/L (ref 98–110)
Creat: 1.42 mg/dL — ABNORMAL HIGH (ref 0.50–1.05)
Globulin: 2.4 g/dL (calc) (ref 1.9–3.7)
Glucose, Bld: 245 mg/dL — ABNORMAL HIGH (ref 65–99)
Potassium: 5.3 mmol/L (ref 3.5–5.3)
Sodium: 140 mmol/L (ref 135–146)
Total Bilirubin: 0.3 mg/dL (ref 0.2–1.2)
Total Protein: 6.4 g/dL (ref 6.1–8.1)

## 2018-05-06 LAB — CBC
HCT: 42.6 % (ref 35.0–45.0)
Hemoglobin: 14.3 g/dL (ref 11.7–15.5)
MCH: 30.6 pg (ref 27.0–33.0)
MCHC: 33.6 g/dL (ref 32.0–36.0)
MCV: 91.2 fL (ref 80.0–100.0)
MPV: 11.4 fL (ref 7.5–12.5)
Platelets: 279 10*3/uL (ref 140–400)
RBC: 4.67 10*6/uL (ref 3.80–5.10)
RDW: 16.7 % — ABNORMAL HIGH (ref 11.0–15.0)
WBC: 11.6 10*3/uL — ABNORMAL HIGH (ref 3.8–10.8)

## 2018-05-06 LAB — HIV-1 RNA QUANT-NO REFLEX-BLD
HIV 1 RNA Quant: <20 copies/mL
HIV-1 RNA Quant, Log: <1.3 Log copies/mL

## 2018-05-15 ENCOUNTER — Other Ambulatory Visit: Payer: Self-pay

## 2018-05-15 ENCOUNTER — Ambulatory Visit (INDEPENDENT_AMBULATORY_CARE_PROVIDER_SITE_OTHER): Payer: Medicare HMO | Admitting: Infectious Diseases

## 2018-05-15 DIAGNOSIS — B20 Human immunodeficiency virus [HIV] disease: Secondary | ICD-10-CM | POA: Diagnosis not present

## 2018-05-15 DIAGNOSIS — E1165 Type 2 diabetes mellitus with hyperglycemia: Secondary | ICD-10-CM

## 2018-05-15 DIAGNOSIS — M87052 Idiopathic aseptic necrosis of left femur: Secondary | ICD-10-CM | POA: Diagnosis not present

## 2018-05-15 DIAGNOSIS — J209 Acute bronchitis, unspecified: Secondary | ICD-10-CM

## 2018-05-15 DIAGNOSIS — N183 Chronic kidney disease, stage 3 unspecified: Secondary | ICD-10-CM

## 2018-05-15 DIAGNOSIS — Z113 Encounter for screening for infections with a predominantly sexual mode of transmission: Secondary | ICD-10-CM

## 2018-05-15 DIAGNOSIS — Z79899 Other long term (current) drug therapy: Secondary | ICD-10-CM

## 2018-05-15 DIAGNOSIS — E118 Type 2 diabetes mellitus with unspecified complications: Secondary | ICD-10-CM | POA: Diagnosis not present

## 2018-05-15 DIAGNOSIS — Z720 Tobacco use: Secondary | ICD-10-CM

## 2018-05-15 DIAGNOSIS — IMO0002 Reserved for concepts with insufficient information to code with codable children: Secondary | ICD-10-CM

## 2018-05-15 DIAGNOSIS — J440 Chronic obstructive pulmonary disease with acute lower respiratory infection: Secondary | ICD-10-CM

## 2018-05-15 NOTE — Assessment & Plan Note (Signed)
Has pulm f/u for her copd On multiple inhalers Feels like she is at baseline.

## 2018-05-15 NOTE — Assessment & Plan Note (Signed)
Encouraged to quit. 

## 2018-05-15 NOTE — Progress Notes (Signed)
   Subjective:    Patient ID: [name], female    DOB: 1965/04/04, 53 y.o.   MRN: [id number]  HPI 53yo F with HIV+ since 2009, DM2 with retinopathy + neuropathy + nephropathy, hyperlipidemia, obesity.L BKA 2016.Previously taking atripla then switched to odefsy -->genvoya due to worsening kidney fxn.COPD. Has had chronic back pain as well.  Had MRI (05-22-13) done which showed L1-2 and L4-5 disc compression, stenosis, nerve inpingement (L5). Was eval by neurosurgery and deferred.  Multiple visits for COPD, uncontrolled DM since last ID visit.  She has also been seen by Ortho for L hip AVN.  She has also been seen for pancreatitis. She had flank and abd pain with this. Since resolved, changed DM rx.  Denies missed ART or problems with ART. She states her FSG have been variable after med changes.   HIV 1 RNA Quant (copies/mL)  Date Value  05/01/2018 <20 NOT DETECTED  05/21/2017 <20 DETECTED (A)  11/20/2016 28 (H)   CD4 T Cell Abs (/uL)  Date Value  05/01/2018 267 (L)  05/21/2017 770  02/24/2017 220 (L)    Review of Systems  Constitutional: Negative for chills, fever and unexpected weight change.  Respiratory: Positive for cough.   Gastrointestinal: Negative for constipation and diarrhea.  Genitourinary: Negative for difficulty urinating.  Neurological: Positive for numbness.  Psychiatric/Behavioral: Positive for dysphoric mood and sleep disturbance.  she had recent urim was covid test (-).  Has had psych f/u. rx for panic Please see HPI. All other systems reviewed and negative.     Objective:   Physical Exam  Phone visit      Assessment & Plan:

## 2018-05-15 NOTE — Assessment & Plan Note (Signed)
Cr increased slightly from prev. Suspect from DM.

## 2018-05-15 NOTE — Addendum Note (Signed)
Addended by: HATCHER, JEFFREY C on: 05/15/2018 04:14 PM   Modules accepted: Orders

## 2018-05-15 NOTE — Assessment & Plan Note (Signed)
Appreciate PCP f/u. Working on better control.

## 2018-05-15 NOTE — Assessment & Plan Note (Signed)
Still painful Possible THR in future (if pain worse).

## 2018-05-15 NOTE — Assessment & Plan Note (Signed)
She is doing well Will continue genvoya.  She has gotten PCV 13 and 23. Not clear she got flu vax this year.  Will see her back in 6 months.  Need to re-address mammo and PAP.

## 2018-05-16 ENCOUNTER — Encounter: Payer: Medicare HMO | Admitting: Infectious Diseases

## 2018-05-27 ENCOUNTER — Other Ambulatory Visit: Payer: Self-pay

## 2018-05-30 ENCOUNTER — Other Ambulatory Visit: Payer: Self-pay | Admitting: Infectious Diseases

## 2018-05-30 ENCOUNTER — Other Ambulatory Visit: Payer: Self-pay | Admitting: Endocrinology

## 2018-05-30 ENCOUNTER — Ambulatory Visit (INDEPENDENT_AMBULATORY_CARE_PROVIDER_SITE_OTHER): Payer: 59 | Admitting: Endocrinology

## 2018-05-30 ENCOUNTER — Encounter: Payer: Self-pay | Admitting: Endocrinology

## 2018-05-30 ENCOUNTER — Other Ambulatory Visit: Payer: Self-pay

## 2018-05-30 DIAGNOSIS — E1165 Type 2 diabetes mellitus with hyperglycemia: Secondary | ICD-10-CM

## 2018-05-30 DIAGNOSIS — Z794 Long term (current) use of insulin: Secondary | ICD-10-CM

## 2018-05-30 DIAGNOSIS — B20 Human immunodeficiency virus [HIV] disease: Secondary | ICD-10-CM

## 2018-05-30 NOTE — Progress Notes (Signed)
Patient ID: [name], female   DOB: April 24, 1965, 53 y.o.   MRN: [id number]           Reason for Appointment: Follow-up  for Type 2 Diabetes   Today's office visit was provided via telemedicine using video technique Video connection was interrupted during the visit and was completed with telephone conversation . Consent for the patient has been obtained . Location of the patient: Home . Location of the provider: Office Only the patient and myself were participating in the encounter   History of Present Illness:          Date of diagnosis of type 2 diabetes mellitus: 2000       Background history:     The patient was apparently diagnosed incidentally with her diabetes 16 years ago 16 years ago, was asymptomatic. She took metformin and possibly other oral hypoglycemic drugs but subsequently went on insulin in 2005 She was taking various insulin regimens in the past  She was being treated by an endocrinologist some time ago and he switched her To Toujeo and also tried her on Byetta.  She thinks that she had better blood sugars with Byetta but she did not follow-up  Novolog was switched to U-500 insulin in 3/17   Recent history:   INSULIN regimen is:  Toujeo 60 units bid  Humulin R U-500: 45 units in the morning, 35 before lunch and 45 at dinnertime    Non-insulin hypoglycemic drugs the patient is taking are: Invokana 100 mg daily    Her A1c has been persistently high and last 8.6   Current management, blood sugar patterns and problems identified:  She is now taking Invokana since April  Previously Victoza was stopped elsewhere because of episode of pancreatitis  Her insulin doses were increased on the last visit because of mostly high blood sugars by recall  However she is taking about the same amount of insulin on the Humulin R except as taken 20 units more at dinnertime  Also she thinks that occasionally if she has an afternoon snack she will take likely another  20 units of the Humulin R  This may have caused her to have a low sugar on the night of 5/27 at about 4 AM  FASTING readings are variable but only below 100 twice, was 168 this morning  The rest of the day the blood sugars are fluctuating between about 100 and 190  She thinks that with Invokana she has smaller appetite, however sometimes will have snacks with Frito chips  Currently checking her blood sugars at least twice a day  No labs available to assess her level of control    Side effects from medications have been: None  Compliance with the medical regimen:  fair  Glucose monitoring:  done about 1-2 times a day       Glucometer: Accu-Chek    Blood Glucose readings from :   PRE-MEAL Fasting  midday  4 PM Bedtime Overall  Glucose range:  70-311  171, 188  102, 188  80-218   Mean/median:        POST-MEAL PC Breakfast PC Lunch PC Dinner  Glucose range:  82   151, 523  Mean/median:      Overnight: 84, 42  Self-care:   Typical meal intake: Breakfast is mostly a yogurt, fruit; or egg/toast  First meal at about 11 AM, has mixed meal at dinnertime 6 pm, usually 2 meals a day Has variable amounts of snacks.  Dietician visit, most recent: At [facility]               Exercise:  none  Weight history:  Wt Readings from Last 3 Encounters:  03/11/18 243 lb 6.4 oz (110.4 kg)  01/24/18 246 lb (111.6 kg)  01/02/18 236 lb (107 kg)    Glycemic control: Baseline A1c is 11 in 8/16   Lab Results  Component Value Date   HGBA1C 8.6 (H) 01/21/2018   HGBA1C 7.2 (H) 12/29/2017   HGBA1C 8.3 (H) 07/29/2017   Lab Results  Component Value Date   MICROALBUR 1.2 03/05/2016   LDLCALC 147 (H) 05/21/2017   CREATININE 1.42 (H) 05/01/2018     Other active problems: See review of systems    Allergies as of 05/30/2018      Reactions   Cortizone-10 [hydrocortisone] Rash   Per patient, given local injection at knee and developed rash at local site.        Medication List       Accurate as of May 30, 2018  9:42 AM. If you have any questions, ask your nurse or doctor.        STOP taking these medications   fenofibrate 145 MG tablet Commonly known as:  TRICOR Stopped by:  Elayne Snare, MD     TAKE these medications   albuterol 108 (90 Base) MCG/ACT inhaler Commonly known as:  VENTOLIN HFA INHALE 2 PUFFS INTO THE LUNGS EVERY 6 HOURS AS NEEDED FOR WHEEZING OR SHORTNESS OF BREATH   canagliflozin 100 MG Tabs tablet Commonly known as:  Invokana 1 tablet before breakfast   diphenoxylate-atropine 2.5-0.025 MG tablet Commonly known as:  Lomotil Take 1 tablet by mouth 4 (four) times daily as needed for diarrhea or loose stools.   fluticasone 50 MCG/ACT nasal spray Commonly known as:  FLONASE Place 1 spray into both nostrils daily.   fluticasone furoate-vilanterol 100-25 MCG/INH Aepb Commonly known as:  Breo Ellipta INHALE 1 PUFF INTO THE LUNGS DAILY   Fluticasone-Umeclidin-Vilant 100-62.5-25 MCG/INH Aepb Commonly known as:  Trelegy Ellipta Inhale 1 puff into the lungs daily.   Genvoya 150-150-200-10 MG Tabs tablet Generic drug:  elvitegravir-cobicistat-emtricitabine-tenofovir TAKE 1 TABLET BY MOUTH DAILY   glucose blood test strip Commonly known as:  Accu-Chek Aviva Use as instructed to check blood sugar 2 times daily.   HumuLIN R U-500 KwikPen 500 UNIT/ML kwikpen Generic drug:  insulin regular human CONCENTRATED INJECT 45 UNITS UNDER THE SKIN AT BREAKFAST, 30 UNITS UNDER THE SKIN AT LUNCH AND 15 UNITS BEFORE SNACKS   Insulin Glargine (2 Unit Dial) 300 UNIT/ML Sopn Commonly known as:  Toujeo Max SoloStar Inject 50 Units into the skin 2 (two) times daily. INJECT 50 UNITS UNDER THE SKIN TWICE DAILY. What changed:    how much to take  additional instructions   ipratropium-albuterol 0.5-2.5 (3) MG/3ML Soln Commonly known as:  DUONEB Take 3 mLs by nebulization every 6 (six) hours.   levocetirizine 5 MG tablet Commonly  known as:  Xyzal Take 1 tablet daily for allergies.   MAGnesium-Oxide 400 (241.3 Mg) MG tablet Generic drug:  magnesium oxide Take 400 mg by mouth daily.   montelukast 10 MG tablet Commonly known as:  SINGULAIR Take 1 tablet (10 mg total) by mouth at bedtime.   pantoprazole 40 MG tablet Commonly known as:  PROTONIX Take 1 tablet (40 mg total) by mouth daily.   pregabalin 225 MG capsule Commonly known as:  LYRICA Take 225 mg by mouth 2 (two)  times daily. TAKE 1 CAPSULE (225MG) BY MOUTH TWICE DAILY.   rosuvastatin 20 MG tablet Commonly known as:  CRESTOR Take 1 tablet (20 mg total) by mouth daily. Reported on 05/02/2015       Allergies:  Allergies  Allergen Reactions  . Cortizone-10 [Hydrocortisone] Rash    Per patient, given local injection at knee and developed rash at local site.     Past Medical History:  Diagnosis Date  . Acute metabolic encephalopathy 7/90/2409  . Acute on chronic respiratory failure with hypoxia ([place]) 05/19/2016  . Anxiety   . ARF (acute renal failure) ([place]) 05/03/2014  . Arthritis    LEFT HIP  . Asthma    HOSPITALIZED WITH EXCERBATION OF ASTHMA - AND BRONCHITIS AND THE FLU DEC 2013  . Asthma exacerbation attacks 12/28/2011  . Bronchitis 12/28/2011  . CAP (community acquired pneumonia) 07/25/2012  . Chronic kidney disease (CKD), stage IV (severe) ([place]) 11/10/2014  . Class 3 obesity due to excess calories with serious comorbidity and body mass index (BMI) of 40.0 to 44.9 in adult   . Colles' fracture of left radius   . COPD (chronic obstructive pulmonary disease) ([place])   . Depression   . Diabetes mellitus    ON INSULIN AND ORAL MEDICATIONS  . Diabetes mellitus type 2 with complications, uncontrolled ([place]) 04/19/2008   Qualifier: Diagnosis of  By: Tomma Lightning MD, Claiborne Billings     . Diabetic foot infection ([place]) 11/09/2014  . Diabetic ketoacidosis without coma associated with type 2 diabetes mellitus ([place])   . Diabetic neuropathy ([place]) 11/10/2014  . Diarrhea  08/30/2008   Qualifier: Diagnosis of  By: Tomma Lightning MD, Claiborne Billings    . Diastolic dysfunction 73/05/3297  . Difficult intravenous access   . DKA, type 2 ([place]) 04/04/2016  . Dyslipidemia 12/23/2008   Qualifier: Diagnosis of  By: Tomma Lightning MD, Claiborne Billings    . Essential hypertension 04/19/2008   Qualifier: Diagnosis of  By: Tomma Lightning MD, Claiborne Billings    . FATIGUE 07/12/2008   Qualifier: Diagnosis of  By: Tomma Lightning MD, Claiborne Billings    . Femoral neck fracture ([place]) 07/16/2011  . Femur fracture, left ([place]) 07/08/2014  . Fever   . Fracture of distal femur ([place]) 07/08/2014  . GERD (gastroesophageal reflux disease)   . Hereditary and idiopathic peripheral neuropathy 04/19/2008   Qualifier: Diagnosis of  By: Tomma Lightning MD, Claiborne Billings    . Hip fracture, left ([place]) 07/16/2011  . Hip pain   . HIV infection ([place]) 2000  . Human immunodeficiency virus (HIV) disease ([place]) 04/19/2008   [id number] +   . Hyperlipidemia   . Hypertension   . Hyponatremia 12/29/2011  . Influenza A 12/29/2011  . Influenza B   . Insomnia 06/12/2016  . Left hip pain 05/03/2014  . Mood disorder ([place]) 04/19/2008   Qualifier: Diagnosis of  By: Tomma Lightning MD, Claiborne Billings    . Neuropathy    NEUROPATHY HANDS AND FEET  . NSTEMI (non-ST elevated myocardial infarction) ([place]) 05/19/2016  . Obesity hypoventilation syndrome ([place])   . Osteoporosis 07/08/2014  . Pain    SEVERE PAIN LEFT HIP - HX OF LEFT HIP PINNING JULY 2013  . Perimenopausal symptoms 02/13/2012   LMP around 2011. On estrace and provera since around 2012 for hot flashes, mood swings. Estrace 2 mg daily, Provera 2.5 mg for 5 days each month.   . Repeated falls 07/30/2011  . Sepsis ([place]) 04/04/2016  . Shortness of breath    ALLERGIES ARE "ACTING UP"  . SOB (shortness of breath)   .  THRUSH 05/20/2008   Qualifier: Diagnosis of  By: Tomma Lightning MD, Claiborne Billings      . Tobacco use disorder 04/14/2010    Past Surgical History:  Procedure Laterality Date  . AMPUTATION Left 11/18/2014   Procedure: LEFT BELOW KNEE AMPUTATION;  Surgeon: Leandrew Koyanagi, MD;  Location: [facility];  Service: Orthopedics;  Laterality: Left;  . CAST APPLICATION Left 09/04/8544   Procedure: CAST APPLICATION (FIBERGLASS);  Surgeon: Latanya Maudlin, MD;  Location: [facility];  Service: Orthopedics;  Laterality: Left;  CLOSED REDUCTION OF LEFT COLLES FRACTURE WITH SHORT ARM CAST  . HARDWARE REMOVAL Left 05/05/2014   Procedure: HARDWARE REMOVAL LEFT HIP;  Surgeon: Latanya Maudlin, MD;  Location: [facility];  Service: Orthopedics;  Laterality: Left;  REMOVAL BIOMET 6.5-8.0 CANNULATED SCREW  . HIP ARTHROPLASTY Left 05/05/2014   Procedure: ARTHROPLASTY OPEN REDUCTION INTERNAL FIXATION LEFT HIP AND REMOVAL OF TWO CANNULATED SCREW;  Surgeon: Latanya Maudlin, MD;  Location: [facility];  Service: Orthopedics;  Laterality: Left;  . HIP PINNING,CANNULATED  07/16/2011   Procedure: CANNULATED HIP PINNING;  Surgeon: Gearlean Alf, MD;  Location: [facility];  Service: Orthopedics;  Laterality: Left;  . HIP PINNING,CANNULATED Left 04/09/2012   Procedure: CANNULATED HIP PINNING AND HARDWARE REVISION;  Surgeon: Gearlean Alf, MD;  Location: [facility];  Service: Orthopedics;  Laterality: Left;  . I&D EXTREMITY Left 11/16/2014   Procedure:  DEBRIDEMENT OF LEFT FOOT POSSIBLE APPLICATION OF INTEGRIA AND VAC ;  Surgeon: Irene Limbo, MD;  Location: [facility];  Service: Plastics;  Laterality: Left;  . PERIPHERAL VASCULAR CATHETERIZATION N/A 11/15/2014   Procedure: Abdominal Aortogram;  Surgeon: Conrad Hamlin, MD;  Location: [facility];  Service: Cardiovascular;  Laterality: N/A;  . PERIPHERAL VASCULAR CATHETERIZATION  11/15/2014   Procedure: Lower Extremity Angiography;  Surgeon: Conrad Davenport, MD;  Location: [facility];  Service: Cardiovascular;;  . PERIPHERAL VASCULAR CATHETERIZATION Left 11/15/2014   Procedure: Peripheral Vascular Intervention;  Surgeon: Conrad [place], MD;  Location: [facility];  Service: Cardiovascular;  Laterality: Left;  popliteal artery stenting    Family History  Problem  Relation Age of Onset  . Diabetes Mother   . Hypertension Mother   . Vision loss Mother   . Heart disease Mother   . Hypertension Father   . Thyroid disease Neg Hx     Social History:  reports that she has been smoking e-cigarettes and cigarettes. She started smoking about 37 years ago. She has a 57.00 pack-year smoking history. She has never used smokeless tobacco. She reports that she does not drink alcohol or use drugs.    Review of Systems   RENAL dysfunction: Variable, currently on Invokana but no Lasix or other diuretics  Lab Results  Component Value Date   CREATININE 1.42 (H) 05/01/2018   CREATININE 1.36 (H) 01/21/2018   CREATININE 1.16 (H) 01/05/2018      Lipid history: Has been treated with  Fenofibrate and Crestor by PCP LDL is below 100    Lab Results  Component Value Date   CHOL 161 01/21/2018   HDL 47.60 01/21/2018   LDLCALC 147 (H) 05/21/2017   LDLDIRECT 91.0 01/21/2018   TRIG 234.0 (H) 01/21/2018   CHOLHDL 3 01/21/2018           She takes Lyrica for phantom pain of her leg  Blood pressure has been variable, currently not on antihypertensives or ACE inhibitor   BP Readings from Last 3 Encounters:  03/11/18 130/68  01/24/18 120/74  01/05/18 118/88   Pancreatitis: She had abdominal pain and high lipase but no abnormality on CT scan of abdomen done on 04/01/2018   Physical Examination:  LMP 11/16/2010   Her face appears somewhat puffy    ASSESSMENT:  Diabetes type 2, uncontrolled with obesity  See history of present illness for detailed discussion of current diabetes management, blood sugar patterns and problems identified   A1c is 8.6 last  She is on a regimen of Toujeo twice daily and Humulin R U-500 insulin now 3 times a day With not taking Victoza her blood sugars have been higher She is now taking Invokana  Objectively not able to assess her level of control but her home readings are generally better and mostly under 200 compared to  before However still has fluctuation in her blood sugars and no consistent pattern  She may have low normal or low sugars overnight which is at least partly related to her taking an extra dose of Humulin R in the late afternoon months Also she is taking 45 units at suppertime instead of the recommended 35  Although her renal function appears to be slightly worse the labs were done about a month ago    PLAN:   She will reduce her suppertime dose by at least 5 units for now Reminded her to take the injection 30 minutes before eating She will also need to let us know if she has any more low sugars Continue Invokana but need to follow-up with renal function as soon as possible Discussed blood sugar targets at various times Encouraged her to keep her snacks small and low in carbohydrates Follow-up in 6 weeks   There are no Patient Instructions on file for this visit.    Elayne Snare 05/30/2018, 9:42 AM   Note: This office note was prepared with Dragon voice recognition system technology. Any transcriptional errors that result from this process are unintentional.

## 2018-06-01 ENCOUNTER — Emergency Department (HOSPITAL_COMMUNITY): Payer: Medicare HMO

## 2018-06-01 ENCOUNTER — Other Ambulatory Visit: Payer: Self-pay

## 2018-06-01 ENCOUNTER — Encounter (HOSPITAL_COMMUNITY): Payer: Self-pay

## 2018-06-01 ENCOUNTER — Inpatient Hospital Stay (HOSPITAL_COMMUNITY)
Admission: EM | Admit: 2018-06-01 | Discharge: 2018-06-23 | DRG: 492 | Disposition: A | Discharge status: Skilled Nursing Facility | Payer: Medicare HMO | Attending: Internal Medicine | Admitting: Internal Medicine

## 2018-06-01 DIAGNOSIS — W19XXXA Unspecified fall, initial encounter: Secondary | ICD-10-CM

## 2018-06-01 DIAGNOSIS — E872 Acidosis: Secondary | ICD-10-CM | POA: Diagnosis present

## 2018-06-01 DIAGNOSIS — N183 Chronic kidney disease, stage 3 unspecified: Secondary | ICD-10-CM | POA: Diagnosis present

## 2018-06-01 DIAGNOSIS — R05 Cough: Secondary | ICD-10-CM

## 2018-06-01 DIAGNOSIS — Z7951 Long term (current) use of inhaled steroids: Secondary | ICD-10-CM

## 2018-06-01 DIAGNOSIS — I129 Hypertensive chronic kidney disease with stage 1 through stage 4 chronic kidney disease, or unspecified chronic kidney disease: Secondary | ICD-10-CM | POA: Diagnosis present

## 2018-06-01 DIAGNOSIS — E1122 Type 2 diabetes mellitus with diabetic chronic kidney disease: Secondary | ICD-10-CM | POA: Diagnosis present

## 2018-06-01 DIAGNOSIS — Z89512 Acquired absence of left leg below knee: Secondary | ICD-10-CM

## 2018-06-01 DIAGNOSIS — J9622 Acute and chronic respiratory failure with hypercapnia: Secondary | ICD-10-CM | POA: Diagnosis present

## 2018-06-01 DIAGNOSIS — G9341 Metabolic encephalopathy: Secondary | ICD-10-CM | POA: Diagnosis present

## 2018-06-01 DIAGNOSIS — Z8249 Family history of ischemic heart disease and other diseases of the circulatory system: Secondary | ICD-10-CM

## 2018-06-01 DIAGNOSIS — E785 Hyperlipidemia, unspecified: Secondary | ICD-10-CM | POA: Diagnosis present

## 2018-06-01 DIAGNOSIS — Z79899 Other long term (current) drug therapy: Secondary | ICD-10-CM

## 2018-06-01 DIAGNOSIS — S82841A Displaced bimalleolar fracture of right lower leg, initial encounter for closed fracture: Secondary | ICD-10-CM | POA: Diagnosis not present

## 2018-06-01 DIAGNOSIS — Z9981 Dependence on supplemental oxygen: Secondary | ICD-10-CM

## 2018-06-01 DIAGNOSIS — E876 Hypokalemia: Secondary | ICD-10-CM | POA: Diagnosis present

## 2018-06-01 DIAGNOSIS — E662 Morbid (severe) obesity with alveolar hypoventilation: Secondary | ICD-10-CM | POA: Diagnosis present

## 2018-06-01 DIAGNOSIS — B20 Human immunodeficiency virus [HIV] disease: Secondary | ICD-10-CM | POA: Diagnosis present

## 2018-06-01 DIAGNOSIS — Z1159 Encounter for screening for other viral diseases: Secondary | ICD-10-CM

## 2018-06-01 DIAGNOSIS — Z6833 Body mass index (BMI) 33.0-33.9, adult: Secondary | ICD-10-CM

## 2018-06-01 DIAGNOSIS — Z96642 Presence of left artificial hip joint: Secondary | ICD-10-CM | POA: Diagnosis present

## 2018-06-01 DIAGNOSIS — IMO0002 Reserved for concepts with insufficient information to code with codable children: Secondary | ICD-10-CM

## 2018-06-01 DIAGNOSIS — Z821 Family history of blindness and visual loss: Secondary | ICD-10-CM

## 2018-06-01 DIAGNOSIS — Z419 Encounter for procedure for purposes other than remedying health state, unspecified: Secondary | ICD-10-CM

## 2018-06-01 DIAGNOSIS — N184 Chronic kidney disease, stage 4 (severe): Secondary | ICD-10-CM | POA: Diagnosis present

## 2018-06-01 DIAGNOSIS — Z993 Dependence on wheelchair: Secondary | ICD-10-CM

## 2018-06-01 DIAGNOSIS — R0902 Hypoxemia: Secondary | ICD-10-CM

## 2018-06-01 DIAGNOSIS — Y92009 Unspecified place in unspecified non-institutional (private) residence as the place of occurrence of the external cause: Secondary | ICD-10-CM

## 2018-06-01 DIAGNOSIS — I959 Hypotension, unspecified: Secondary | ICD-10-CM | POA: Diagnosis not present

## 2018-06-01 DIAGNOSIS — M810 Age-related osteoporosis without current pathological fracture: Secondary | ICD-10-CM | POA: Diagnosis present

## 2018-06-01 DIAGNOSIS — J449 Chronic obstructive pulmonary disease, unspecified: Secondary | ICD-10-CM | POA: Diagnosis present

## 2018-06-01 DIAGNOSIS — W1830XA Fall on same level, unspecified, initial encounter: Secondary | ICD-10-CM | POA: Diagnosis present

## 2018-06-01 DIAGNOSIS — R059 Cough, unspecified: Secondary | ICD-10-CM

## 2018-06-01 DIAGNOSIS — E1165 Type 2 diabetes mellitus with hyperglycemia: Secondary | ICD-10-CM | POA: Diagnosis present

## 2018-06-01 DIAGNOSIS — Z452 Encounter for adjustment and management of vascular access device: Secondary | ICD-10-CM

## 2018-06-01 DIAGNOSIS — Z833 Family history of diabetes mellitus: Secondary | ICD-10-CM

## 2018-06-01 DIAGNOSIS — Z66 Do not resuscitate: Secondary | ICD-10-CM | POA: Diagnosis present

## 2018-06-01 DIAGNOSIS — E1140 Type 2 diabetes mellitus with diabetic neuropathy, unspecified: Secondary | ICD-10-CM | POA: Diagnosis present

## 2018-06-01 DIAGNOSIS — E162 Hypoglycemia, unspecified: Secondary | ICD-10-CM

## 2018-06-01 DIAGNOSIS — I252 Old myocardial infarction: Secondary | ICD-10-CM

## 2018-06-01 DIAGNOSIS — J9621 Acute and chronic respiratory failure with hypoxia: Secondary | ICD-10-CM | POA: Diagnosis present

## 2018-06-01 DIAGNOSIS — T148XXA Other injury of unspecified body region, initial encounter: Secondary | ICD-10-CM

## 2018-06-01 DIAGNOSIS — Z794 Long term (current) use of insulin: Secondary | ICD-10-CM

## 2018-06-01 DIAGNOSIS — E11649 Type 2 diabetes mellitus with hypoglycemia without coma: Secondary | ICD-10-CM | POA: Diagnosis present

## 2018-06-01 DIAGNOSIS — S9304XA Dislocation of right ankle joint, initial encounter: Secondary | ICD-10-CM | POA: Diagnosis present

## 2018-06-01 LAB — BASIC METABOLIC PANEL
Anion gap: 8 (ref 5–15)
BUN: 17 mg/dL (ref 6–20)
CO2: 27 mmol/L (ref 22–32)
Calcium: 8.2 mg/dL — ABNORMAL LOW (ref 8.9–10.3)
Chloride: 109 mmol/L (ref 98–111)
Creatinine, Ser: 0.92 mg/dL (ref 0.44–1.00)
GFR calc Af Amer: >60 mL/min (ref >60)
GFR calc non Af Amer: >60 mL/min (ref >60)
Glucose, Bld: 46 mg/dL — ABNORMAL LOW (ref 70–99)
Potassium: 3.4 mmol/L — ABNORMAL LOW (ref 3.5–5.1)
Sodium: 144 mmol/L (ref 135–145)

## 2018-06-01 LAB — CBC WITH DIFFERENTIAL/PLATELET
Abs Immature Granulocytes: 0.13 10*3/uL — ABNORMAL HIGH (ref 0.00–0.07)
Basophils Absolute: 0.1 10*3/uL (ref 0.0–0.1)
Basophils Relative: 1 %
Eosinophils Absolute: 0.3 10*3/uL (ref 0.0–0.5)
Eosinophils Relative: 2 %
HCT: 41.6 % (ref 36.0–46.0)
Hemoglobin: 13 g/dL (ref 12.0–15.0)
Immature Granulocytes: 1 %
Lymphocytes Relative: 13 %
Lymphs Abs: 2.3 10*3/uL (ref 0.7–4.0)
MCH: 28.8 pg (ref 26.0–34.0)
MCHC: 31.3 g/dL (ref 30.0–36.0)
MCV: 92 fL (ref 80.0–100.0)
Monocytes Absolute: 1.1 10*3/uL — ABNORMAL HIGH (ref 0.1–1.0)
Monocytes Relative: 6 %
Neutro Abs: 14 10*3/uL — ABNORMAL HIGH (ref 1.7–7.7)
Neutrophils Relative %: 77 %
Platelets: 261 10*3/uL (ref 150–400)
RBC: 4.52 MIL/uL (ref 3.87–5.11)
RDW: 17.2 % — ABNORMAL HIGH (ref 11.5–15.5)
WBC: 17.9 10*3/uL — ABNORMAL HIGH (ref 4.0–10.5)
nRBC: 0.2 % (ref 0.0–0.2)

## 2018-06-01 LAB — CBG MONITORING, ED
Glucose-Capillary: 115 mg/dL — ABNORMAL HIGH (ref 70–99)
Glucose-Capillary: 42 mg/dL — CL (ref 70–99)

## 2018-06-01 MED ORDER — DEXTROSE 50 % IV SOLN
1.0000 | Freq: Once | INTRAVENOUS | Status: AC
  Administered 2018-06-01: 50 mL via INTRAVENOUS

## 2018-06-01 MED ORDER — GLUCOSE 40 % PO GEL
1.0000 | Freq: Once | ORAL | Status: AC
  Administered 2018-06-01: 37.5 g via ORAL
  Filled 2018-06-01: qty 1

## 2018-06-01 MED ORDER — DEXTROSE 50 % IV SOLN
INTRAVENOUS | Status: AC
  Filled 2018-06-01: qty 50

## 2018-06-01 NOTE — ED Notes (Signed)
This pt has required redirection multiple times regarding staying in bed, leaving her pulse ox and oxygen on. Her blood sugar is now 115. She complains of back pain and not her deformed ankle most likely due to neuropathy.

## 2018-06-01 NOTE — ED Notes (Signed)
Gave oral glucose
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
## 2018-06-01 NOTE — ED Notes (Signed)
Checked CBG 36, RN Elizabeth informed

## 2018-06-01 NOTE — ED Triage Notes (Signed)
[facility] reports pt fell around 7 and was checked out by ALS but they didn't transport. Her cousin called EMS for ankle deformity. Pt cbg enroute 54, given oral glucose. Hx COPD norm spO2 90%. Pt uses wheel chair.

## 2018-06-02 ENCOUNTER — Inpatient Hospital Stay (HOSPITAL_COMMUNITY): Payer: Medicare HMO | Admitting: Anesthesiology

## 2018-06-02 ENCOUNTER — Inpatient Hospital Stay (HOSPITAL_COMMUNITY): Payer: Medicare HMO

## 2018-06-02 ENCOUNTER — Emergency Department (HOSPITAL_COMMUNITY): Payer: Medicare HMO

## 2018-06-02 ENCOUNTER — Encounter (HOSPITAL_COMMUNITY)
Admission: EM | Disposition: A | Discharge status: Skilled Nursing Facility | Payer: Self-pay | Source: Home / Self Care | Attending: Family Medicine

## 2018-06-02 ENCOUNTER — Encounter (HOSPITAL_COMMUNITY): Payer: Self-pay | Admitting: Certified Registered"

## 2018-06-02 DIAGNOSIS — E785 Hyperlipidemia, unspecified: Secondary | ICD-10-CM | POA: Diagnosis present

## 2018-06-02 DIAGNOSIS — I959 Hypotension, unspecified: Secondary | ICD-10-CM | POA: Diagnosis not present

## 2018-06-02 DIAGNOSIS — I129 Hypertensive chronic kidney disease with stage 1 through stage 4 chronic kidney disease, or unspecified chronic kidney disease: Secondary | ICD-10-CM | POA: Diagnosis present

## 2018-06-02 DIAGNOSIS — J9622 Acute and chronic respiratory failure with hypercapnia: Secondary | ICD-10-CM | POA: Diagnosis present

## 2018-06-02 DIAGNOSIS — S82841A Displaced bimalleolar fracture of right lower leg, initial encounter for closed fracture: Secondary | ICD-10-CM | POA: Diagnosis present

## 2018-06-02 DIAGNOSIS — E1165 Type 2 diabetes mellitus with hyperglycemia: Secondary | ICD-10-CM

## 2018-06-02 DIAGNOSIS — E662 Morbid (severe) obesity with alveolar hypoventilation: Secondary | ICD-10-CM | POA: Diagnosis present

## 2018-06-02 DIAGNOSIS — E1122 Type 2 diabetes mellitus with diabetic chronic kidney disease: Secondary | ICD-10-CM | POA: Diagnosis present

## 2018-06-02 DIAGNOSIS — G9341 Metabolic encephalopathy: Secondary | ICD-10-CM | POA: Diagnosis present

## 2018-06-02 DIAGNOSIS — J449 Chronic obstructive pulmonary disease, unspecified: Secondary | ICD-10-CM

## 2018-06-02 DIAGNOSIS — N183 Chronic kidney disease, stage 3 (moderate): Secondary | ICD-10-CM

## 2018-06-02 DIAGNOSIS — Z96642 Presence of left artificial hip joint: Secondary | ICD-10-CM | POA: Diagnosis present

## 2018-06-02 DIAGNOSIS — I252 Old myocardial infarction: Secondary | ICD-10-CM | POA: Diagnosis not present

## 2018-06-02 DIAGNOSIS — W19XXXA Unspecified fall, initial encounter: Secondary | ICD-10-CM | POA: Diagnosis not present

## 2018-06-02 DIAGNOSIS — B20 Human immunodeficiency virus [HIV] disease: Secondary | ICD-10-CM | POA: Diagnosis present

## 2018-06-02 DIAGNOSIS — E1140 Type 2 diabetes mellitus with diabetic neuropathy, unspecified: Secondary | ICD-10-CM | POA: Diagnosis present

## 2018-06-02 DIAGNOSIS — Y92009 Unspecified place in unspecified non-institutional (private) residence as the place of occurrence of the external cause: Secondary | ICD-10-CM | POA: Diagnosis not present

## 2018-06-02 DIAGNOSIS — J9621 Acute and chronic respiratory failure with hypoxia: Secondary | ICD-10-CM | POA: Diagnosis present

## 2018-06-02 DIAGNOSIS — M810 Age-related osteoporosis without current pathological fracture: Secondary | ICD-10-CM | POA: Diagnosis present

## 2018-06-02 DIAGNOSIS — Z1159 Encounter for screening for other viral diseases: Secondary | ICD-10-CM | POA: Diagnosis not present

## 2018-06-02 DIAGNOSIS — E118 Type 2 diabetes mellitus with unspecified complications: Secondary | ICD-10-CM | POA: Diagnosis not present

## 2018-06-02 DIAGNOSIS — Z66 Do not resuscitate: Secondary | ICD-10-CM | POA: Diagnosis present

## 2018-06-02 DIAGNOSIS — E876 Hypokalemia: Secondary | ICD-10-CM | POA: Diagnosis present

## 2018-06-02 DIAGNOSIS — W1830XA Fall on same level, unspecified, initial encounter: Secondary | ICD-10-CM | POA: Diagnosis present

## 2018-06-02 DIAGNOSIS — E872 Acidosis: Secondary | ICD-10-CM | POA: Diagnosis present

## 2018-06-02 DIAGNOSIS — S9304XA Dislocation of right ankle joint, initial encounter: Secondary | ICD-10-CM | POA: Diagnosis present

## 2018-06-02 DIAGNOSIS — S82891A Other fracture of right lower leg, initial encounter for closed fracture: Secondary | ICD-10-CM

## 2018-06-02 DIAGNOSIS — E11649 Type 2 diabetes mellitus with hypoglycemia without coma: Secondary | ICD-10-CM | POA: Diagnosis present

## 2018-06-02 DIAGNOSIS — N184 Chronic kidney disease, stage 4 (severe): Secondary | ICD-10-CM | POA: Diagnosis present

## 2018-06-02 HISTORY — PX: EXTERNAL FIXATION LEG: SHX1549

## 2018-06-02 LAB — CBG MONITORING, ED
Glucose-Capillary: 32 mg/dL — CL (ref 70–99)
Glucose-Capillary: 34 mg/dL — CL (ref 70–99)
Glucose-Capillary: 191 mg/dL — ABNORMAL HIGH (ref 70–99)
Glucose-Capillary: 237 mg/dL — ABNORMAL HIGH (ref 70–99)
Glucose-Capillary: 150 mg/dL — ABNORMAL HIGH (ref 70–99)
Glucose-Capillary: 153 mg/dL — ABNORMAL HIGH (ref 70–99)

## 2018-06-02 LAB — POCT I-STAT 7, (LYTES, BLD GAS, ICA,H+H)
Acid-Base Excess: 5 mmol/L — ABNORMAL HIGH (ref 0.0–2.0)
Acid-Base Excess: 4 mmol/L — ABNORMAL HIGH (ref 0.0–2.0)
Bicarbonate: 30.8 mmol/L — ABNORMAL HIGH (ref 20.0–28.0)
Bicarbonate: 32.7 mmol/L — ABNORMAL HIGH (ref 20.0–28.0)
Calcium, Ion: 1.27 mmol/L (ref 1.15–1.40)
Calcium, Ion: 1.32 mmol/L (ref 1.15–1.40)
HCT: 38 % (ref 36.0–46.0)
HCT: 38 % (ref 36.0–46.0)
Hemoglobin: 12.9 g/dL (ref 12.0–15.0)
Hemoglobin: 12.9 g/dL (ref 12.0–15.0)
O2 Saturation: 97 %
O2 Saturation: 98 %
Patient temperature: 98.6
Patient temperature: 97.4
Potassium: 3.7 mmol/L (ref 3.5–5.1)
Potassium: 3.4 mmol/L — ABNORMAL LOW (ref 3.5–5.1)
Sodium: 143 mmol/L (ref 135–145)
Sodium: 143 mmol/L (ref 135–145)
TCO2: 32 mmol/L (ref 22–32)
TCO2: 35 mmol/L — ABNORMAL HIGH (ref 22–32)
pCO2 arterial: 48.2 mmHg — ABNORMAL HIGH (ref 32.0–48.0)
pCO2 arterial: 70 mmHg (ref 32.0–48.0)
pH, Arterial: 7.413 (ref 7.350–7.450)
pH, Arterial: 7.274 — ABNORMAL LOW (ref 7.350–7.450)
pO2, Arterial: 88 mmHg (ref 83.0–108.0)
pO2, Arterial: 112 mmHg — ABNORMAL HIGH (ref 83.0–108.0)

## 2018-06-02 LAB — BLOOD GAS, ARTERIAL
Acid-Base Excess: 5.5 mmol/L — ABNORMAL HIGH (ref 0.0–2.0)
Bicarbonate: 30.9 mmol/L — ABNORMAL HIGH (ref 20.0–28.0)
Delivery systems: POSITIVE
Drawn by: 301361
Expiratory PAP: 6
FIO2: 30
Inspiratory PAP: 14
O2 Saturation: 93.5 %
Patient temperature: 97.6
RATE: 8 resp/min
pCO2 arterial: 55.7 mmHg — ABNORMAL HIGH (ref 32.0–48.0)
pH, Arterial: 7.359 (ref 7.350–7.450)
pO2, Arterial: 66.6 mmHg — ABNORMAL LOW (ref 83.0–108.0)

## 2018-06-02 LAB — GLUCOSE, CAPILLARY
Glucose-Capillary: 36 mg/dL — CL (ref 70–99)
Glucose-Capillary: 166 mg/dL — ABNORMAL HIGH (ref 70–99)
Glucose-Capillary: 178 mg/dL — ABNORMAL HIGH (ref 70–99)
Glucose-Capillary: 165 mg/dL — ABNORMAL HIGH (ref 70–99)
Glucose-Capillary: 192 mg/dL — ABNORMAL HIGH (ref 70–99)
Glucose-Capillary: 216 mg/dL — ABNORMAL HIGH (ref 70–99)
Glucose-Capillary: 225 mg/dL — ABNORMAL HIGH (ref 70–99)
Glucose-Capillary: 218 mg/dL — ABNORMAL HIGH (ref 70–99)

## 2018-06-02 LAB — SURGICAL PCR SCREEN
MRSA, PCR: NEGATIVE
Staphylococcus aureus: NEGATIVE

## 2018-06-02 LAB — TYPE AND SCREEN
ABO/RH(D): O NEG
Antibody Screen: NEGATIVE

## 2018-06-02 LAB — SARS CORONAVIRUS 2 BY RT PCR (HOSPITAL ORDER, PERFORMED IN ~~LOC~~ HOSPITAL LAB): SARS Coronavirus 2: NEGATIVE

## 2018-06-02 LAB — ETHANOL: Alcohol, Ethyl (B): <10 mg/dL (ref <10)

## 2018-06-02 LAB — AMMONIA: Ammonia: 23 umol/L (ref 9–35)

## 2018-06-02 SURGERY — EXTERNAL FIXATION, LOWER EXTREMITY
Anesthesia: General | Site: Ankle | Laterality: Right

## 2018-06-02 MED ORDER — LACTATED RINGERS IV SOLN
INTRAVENOUS | Status: DC | PRN
  Administered 2018-06-02: 15:00:00 via INTRAVENOUS

## 2018-06-02 MED ORDER — ROSUVASTATIN CALCIUM 20 MG PO TABS
20.0000 mg | ORAL_TABLET | Freq: Every day | ORAL | Status: DC
  Administered 2018-06-03 – 2018-06-23 (×21): 20 mg via ORAL
  Filled 2018-06-02 (×22): qty 1

## 2018-06-02 MED ORDER — SUCCINYLCHOLINE CHLORIDE 200 MG/10ML IV SOSY
PREFILLED_SYRINGE | INTRAVENOUS | Status: DC | PRN
  Administered 2018-06-02: 140 mg via INTRAVENOUS

## 2018-06-02 MED ORDER — PREGABALIN 75 MG PO CAPS
225.0000 mg | ORAL_CAPSULE | Freq: Two times a day (BID) | ORAL | Status: DC
  Administered 2018-06-03 – 2018-06-23 (×40): 225 mg via ORAL
  Filled 2018-06-02 (×40): qty 3

## 2018-06-02 MED ORDER — ELVITEG-COBIC-EMTRICIT-TENOFAF 150-150-200-10 MG PO TABS
1.0000 | ORAL_TABLET | Freq: Every day | ORAL | Status: DC
  Administered 2018-06-03 – 2018-06-04 (×2): 1 via ORAL
  Filled 2018-06-02 (×3): qty 1

## 2018-06-02 MED ORDER — INSULIN DETEMIR 100 UNIT/ML ~~LOC~~ SOLN
12.0000 [IU] | Freq: Every day | SUBCUTANEOUS | Status: DC
  Administered 2018-06-02 – 2018-06-08 (×7): 12 [IU] via SUBCUTANEOUS
  Filled 2018-06-02 (×8): qty 0.12

## 2018-06-02 MED ORDER — DEXTROSE-NACL 5-0.2 % IV SOLN: INTRAVENOUS | Status: DC

## 2018-06-02 MED ORDER — MIDAZOLAM HCL 2 MG/2ML IJ SOLN
INTRAMUSCULAR | Status: AC
  Filled 2018-06-02: qty 2

## 2018-06-02 MED ORDER — PROPOFOL 10 MG/ML IV BOLUS
INTRAVENOUS | Status: AC
  Filled 2018-06-02: qty 40

## 2018-06-02 MED ORDER — FENTANYL CITRATE (PF) 100 MCG/2ML IJ SOLN
INTRAMUSCULAR | Status: DC | PRN
  Administered 2018-06-02: 50 ug via INTRAVENOUS

## 2018-06-02 MED ORDER — DEXTROSE 50 % IV SOLN
1.0000 | Freq: Once | INTRAVENOUS | Status: AC
  Administered 2018-06-02: 03:00:00 50 mL via INTRAVENOUS
  Filled 2018-06-02: qty 50

## 2018-06-02 MED ORDER — UMECLIDINIUM BROMIDE 62.5 MCG/INH IN AEPB
1.0000 | INHALATION_SPRAY | Freq: Every day | RESPIRATORY_TRACT | Status: DC
  Administered 2018-06-03 – 2018-06-23 (×21): 1 via RESPIRATORY_TRACT
  Filled 2018-06-02 (×3): qty 7

## 2018-06-02 MED ORDER — DEXTROSE-NACL 5-0.45 % IV SOLN
INTRAVENOUS | Status: DC
  Administered 2018-06-02: 06:00:00 via INTRAVENOUS

## 2018-06-02 MED ORDER — INSULIN ASPART 100 UNIT/ML ~~LOC~~ SOLN
0.0000 [IU] | Freq: Three times a day (TID) | SUBCUTANEOUS | Status: DC
  Administered 2018-06-02: 3 [IU] via SUBCUTANEOUS
  Administered 2018-06-03 (×3): 2 [IU] via SUBCUTANEOUS
  Administered 2018-06-04: 1 [IU] via SUBCUTANEOUS
  Administered 2018-06-04 (×2): 3 [IU] via SUBCUTANEOUS
  Administered 2018-06-05 – 2018-06-06 (×4): 2 [IU] via SUBCUTANEOUS
  Administered 2018-06-06: 5 [IU] via SUBCUTANEOUS
  Administered 2018-06-06: 2 [IU] via SUBCUTANEOUS
  Administered 2018-06-07 (×2): 5 [IU] via SUBCUTANEOUS
  Administered 2018-06-07 – 2018-06-08 (×3): 3 [IU] via SUBCUTANEOUS
  Administered 2018-06-08 – 2018-06-09 (×3): 5 [IU] via SUBCUTANEOUS
  Administered 2018-06-10: 2 [IU] via SUBCUTANEOUS
  Administered 2018-06-10: 5 [IU] via SUBCUTANEOUS
  Administered 2018-06-10 – 2018-06-11 (×2): 1 [IU] via SUBCUTANEOUS
  Administered 2018-06-11: 13:00:00 2 [IU] via SUBCUTANEOUS
  Administered 2018-06-11: 3 [IU] via SUBCUTANEOUS
  Administered 2018-06-12 (×2): 2 [IU] via SUBCUTANEOUS
  Administered 2018-06-12: 5 [IU] via SUBCUTANEOUS
  Administered 2018-06-13: 2 [IU] via SUBCUTANEOUS
  Administered 2018-06-13: 5 [IU] via SUBCUTANEOUS
  Administered 2018-06-13: 2 [IU] via SUBCUTANEOUS
  Administered 2018-06-14: 7 [IU] via SUBCUTANEOUS
  Administered 2018-06-14: 12:00:00 2 [IU] via SUBCUTANEOUS
  Administered 2018-06-14 – 2018-06-15 (×2): 3 [IU] via SUBCUTANEOUS
  Administered 2018-06-15 – 2018-06-16 (×3): 5 [IU] via SUBCUTANEOUS
  Administered 2018-06-16: 3 [IU] via SUBCUTANEOUS
  Administered 2018-06-16 – 2018-06-17 (×2): 5 [IU] via SUBCUTANEOUS
  Administered 2018-06-17: 3 [IU] via SUBCUTANEOUS
  Administered 2018-06-17 – 2018-06-18 (×2): 5 [IU] via SUBCUTANEOUS
  Administered 2018-06-18: 3 [IU] via SUBCUTANEOUS
  Administered 2018-06-18 – 2018-06-19 (×2): 5 [IU] via SUBCUTANEOUS
  Administered 2018-06-19 – 2018-06-20 (×3): 2 [IU] via SUBCUTANEOUS
  Administered 2018-06-20 (×2): 3 [IU] via SUBCUTANEOUS
  Administered 2018-06-21 – 2018-06-22 (×4): 2 [IU] via SUBCUTANEOUS
  Administered 2018-06-22: 1 [IU] via SUBCUTANEOUS
  Administered 2018-06-22: 3 [IU] via SUBCUTANEOUS
  Administered 2018-06-23 (×2): 2 [IU] via SUBCUTANEOUS
  Administered 2018-06-23: 3 [IU] via SUBCUTANEOUS

## 2018-06-02 MED ORDER — CEFAZOLIN SODIUM-DEXTROSE 2-4 GM/100ML-% IV SOLN
INTRAVENOUS | Status: AC
  Filled 2018-06-02: qty 100

## 2018-06-02 MED ORDER — ALBUTEROL SULFATE (2.5 MG/3ML) 0.083% IN NEBU: 3.0000 mL | INHALATION_SOLUTION | Freq: Four times a day (QID) | RESPIRATORY_TRACT | Status: DC | PRN

## 2018-06-02 MED ORDER — OXYCODONE HCL 5 MG PO TABS
5.0000 mg | ORAL_TABLET | ORAL | Status: DC | PRN
  Administered 2018-06-04 – 2018-06-09 (×23): 5 mg via ORAL
  Filled 2018-06-02 (×23): qty 1

## 2018-06-02 MED ORDER — POTASSIUM CHLORIDE 10 MEQ/100ML IV SOLN
10.0000 meq | INTRAVENOUS | Status: AC
  Administered 2018-06-02 (×2): 10 meq via INTRAVENOUS
  Filled 2018-06-02 (×2): qty 100

## 2018-06-02 MED ORDER — ONDANSETRON HCL 4 MG/2ML IJ SOLN
INTRAMUSCULAR | Status: DC | PRN
  Administered 2018-06-02: 4 mg via INTRAVENOUS

## 2018-06-02 MED ORDER — FLUTICASONE FUROATE-VILANTEROL 100-25 MCG/INH IN AEPB
1.0000 | INHALATION_SPRAY | Freq: Every day | RESPIRATORY_TRACT | Status: DC
  Administered 2018-06-03 – 2018-06-23 (×21): 1 via RESPIRATORY_TRACT
  Filled 2018-06-02 (×2): qty 28

## 2018-06-02 MED ORDER — DEXTROSE 50 % IV SOLN
25.0000 mL | INTRAVENOUS | Status: DC | PRN
  Administered 2018-06-02: 50 mL via INTRAVENOUS
  Filled 2018-06-02: qty 50

## 2018-06-02 MED ORDER — SUCCINYLCHOLINE CHLORIDE 200 MG/10ML IV SOSY
PREFILLED_SYRINGE | INTRAVENOUS | Status: AC
  Filled 2018-06-02: qty 10

## 2018-06-02 MED ORDER — MORPHINE SULFATE (PF) 2 MG/ML IV SOLN: 0.5000 mg | INTRAVENOUS | Status: DC | PRN

## 2018-06-02 MED ORDER — SODIUM CHLORIDE 0.9% FLUSH
10.0000 mL | INTRAVENOUS | Status: DC | PRN
  Administered 2018-06-02 – 2018-06-12 (×2): 10 mL
  Administered 2018-06-13 – 2018-06-23 (×3): 20 mL
  Filled 2018-06-02 (×4): qty 40

## 2018-06-02 MED ORDER — SODIUM CHLORIDE 0.9 % IR SOLN
Status: DC | PRN
  Administered 2018-06-02: 1000 mL

## 2018-06-02 MED ORDER — INSULIN ASPART 100 UNIT/ML ~~LOC~~ SOLN
0.0000 [IU] | Freq: Every day | SUBCUTANEOUS | Status: DC
  Administered 2018-06-02 – 2018-06-04 (×2): 2 [IU] via SUBCUTANEOUS
  Administered 2018-06-06: 3 [IU] via SUBCUTANEOUS
  Administered 2018-06-07: 4 [IU] via SUBCUTANEOUS
  Administered 2018-06-08: 3 [IU] via SUBCUTANEOUS
  Administered 2018-06-09: 5 [IU] via SUBCUTANEOUS
  Administered 2018-06-14: 23:00:00 2 [IU] via SUBCUTANEOUS
  Administered 2018-06-15: 5 [IU] via SUBCUTANEOUS
  Administered 2018-06-16 – 2018-06-19 (×3): 3 [IU] via SUBCUTANEOUS
  Administered 2018-06-20: 2 [IU] via SUBCUTANEOUS

## 2018-06-02 MED ORDER — DEXTROSE 50 % IV SOLN: 25.0000 g | INTRAVENOUS | Status: DC

## 2018-06-02 MED ORDER — MONTELUKAST SODIUM 10 MG PO TABS
10.0000 mg | ORAL_TABLET | Freq: Every day | ORAL | Status: DC
  Administered 2018-06-02 – 2018-06-22 (×20): 10 mg via ORAL
  Filled 2018-06-02 (×20): qty 1

## 2018-06-02 MED ORDER — DEXTROSE 10 % IV SOLN
INTRAVENOUS | Status: DC
  Administered 2018-06-02: 04:00:00 via INTRAVENOUS

## 2018-06-02 MED ORDER — PROPOFOL 10 MG/ML IV BOLUS
INTRAVENOUS | Status: DC | PRN
  Administered 2018-06-02: 60 mg via INTRAVENOUS

## 2018-06-02 MED ORDER — ONDANSETRON HCL 4 MG/2ML IJ SOLN
INTRAMUSCULAR | Status: AC
  Filled 2018-06-02: qty 2

## 2018-06-02 MED ORDER — FENTANYL CITRATE (PF) 250 MCG/5ML IJ SOLN
INTRAMUSCULAR | Status: AC
  Filled 2018-06-02: qty 5

## 2018-06-02 MED ORDER — TRAMADOL HCL 50 MG PO TABS
50.0000 mg | ORAL_TABLET | Freq: Two times a day (BID) | ORAL | Status: DC | PRN
  Administered 2018-06-02 – 2018-06-22 (×16): 50 mg via ORAL
  Filled 2018-06-02 (×16): qty 1

## 2018-06-02 MED ORDER — LACTATED RINGERS IV SOLN
INTRAVENOUS | Status: DC
  Administered 2018-06-03: 04:00:00 via INTRAVENOUS

## 2018-06-02 MED ORDER — HYDROCODONE-ACETAMINOPHEN 5-325 MG PO TABS: 1.0000 | ORAL_TABLET | Freq: Four times a day (QID) | ORAL | Status: DC | PRN

## 2018-06-02 MED ORDER — BUPIVACAINE HCL (PF) 0.25 % IJ SOLN
INTRAMUSCULAR | Status: AC
  Filled 2018-06-02: qty 30

## 2018-06-02 MED ORDER — FLUTICASONE-UMECLIDIN-VILANT 100-62.5-25 MCG/INH IN AEPB: 1.0000 | INHALATION_SPRAY | Freq: Every day | RESPIRATORY_TRACT | Status: DC

## 2018-06-02 MED ORDER — CEFAZOLIN SODIUM-DEXTROSE 2-4 GM/100ML-% IV SOLN
2.0000 g | Freq: Once | INTRAVENOUS | Status: AC
  Administered 2018-06-02: 2 g via INTRAVENOUS

## 2018-06-02 SURGICAL SUPPLY — 71 items
BANDAGE ACE 4X5 VEL STRL LF (GAUZE/BANDAGES/DRESSINGS) ×4 IMPLANT
BANDAGE ACE 6X5 VEL STRL LF (GAUZE/BANDAGES/DRESSINGS) ×4 IMPLANT
BANDAGE ESMARK 6X9 LF (GAUZE/BANDAGES/DRESSINGS) IMPLANT
BAR EXFX 400X11 NS LF (EXFIX) ×2
BAR GLASS FIBER EXFX 11X150 (EXFIX) ×8 IMPLANT
BAR GLASS FIBER EXFX 11X400 (EXFIX) ×8 IMPLANT
BIT DRILL ORANGE 4.0 LONG (BIT) ×4 IMPLANT
BLADE SURG 15 STRL LF DISP TIS (BLADE) ×2 IMPLANT
BLADE SURG 15 STRL SS (BLADE) ×2
BNDG COHESIVE 4X5 TAN STRL (GAUZE/BANDAGES/DRESSINGS) ×4 IMPLANT
BNDG COHESIVE 6X5 TAN STRL LF (GAUZE/BANDAGES/DRESSINGS) ×4 IMPLANT
BNDG ESMARK 6X9 LF (GAUZE/BANDAGES/DRESSINGS)
BNDG GAUZE ELAST 4 BULKY (GAUZE/BANDAGES/DRESSINGS) ×4 IMPLANT
CANISTER SUCT 3000ML PPV (MISCELLANEOUS) ×4 IMPLANT
CAP PROTECTIVE TRANSFX 4.5X5MM (EXFIX) ×8 IMPLANT
CLAMP BLUE BAR TO BAR (EXFIX) ×8 IMPLANT
CLAMP BLUE BAR TO PIN (EXFIX) ×24 IMPLANT
COVER SURGICAL LIGHT HANDLE (MISCELLANEOUS) ×4 IMPLANT
COVER WAND RF STERILE (DRAPES) ×4 IMPLANT
CUFF TOURNIQUET SINGLE 34IN LL (TOURNIQUET CUFF) IMPLANT
CUFF TOURNIQUET SINGLE 44IN (TOURNIQUET CUFF) IMPLANT
DRAPE C-ARM 42X72 X-RAY (DRAPES) ×4 IMPLANT
DRAPE C-ARMOR (DRAPES) ×4 IMPLANT
DRAPE INCISE IOBAN 66X45 STRL (DRAPES) IMPLANT
DRAPE U-SHAPE 47X51 STRL (DRAPES) ×4 IMPLANT
DRSG XEROFORM 1X8 (GAUZE/BANDAGES/DRESSINGS) ×4 IMPLANT
DURAPREP 26ML APPLICATOR (WOUND CARE) ×4 IMPLANT
ELECT CAUTERY BLADE 6.4 (BLADE) ×4 IMPLANT
ELECT REM PT RETURN 9FT ADLT (ELECTROSURGICAL) ×4
ELECTRODE REM PT RTRN 9FT ADLT (ELECTROSURGICAL) ×2 IMPLANT
GAUZE SPONGE 4X4 12PLY STRL (GAUZE/BANDAGES/DRESSINGS) ×4 IMPLANT
GAUZE SPONGE 4X4 12PLY STRL LF (GAUZE/BANDAGES/DRESSINGS) ×4 IMPLANT
GAUZE XEROFORM 5X9 LF (GAUZE/BANDAGES/DRESSINGS) ×4 IMPLANT
GLOVE BIOGEL PI IND STRL 7.0 (GLOVE) ×2 IMPLANT
GLOVE BIOGEL PI INDICATOR 7.0 (GLOVE) ×2
GLOVE ECLIPSE 7.0 STRL STRAW (GLOVE) ×4 IMPLANT
GLOVE SKINSENSE NS SZ7.5 (GLOVE) ×4
GLOVE SKINSENSE STRL SZ7.5 (GLOVE) ×4 IMPLANT
GLOVE SURG SYN 7.5  E (GLOVE) ×4
GLOVE SURG SYN 7.5 E (GLOVE) ×4 IMPLANT
GOWN STRL REIN XL XLG (GOWN DISPOSABLE) ×4 IMPLANT
HANDPIECE INTERPULSE COAX TIP (DISPOSABLE)
KIT BASIN OR (CUSTOM PROCEDURE TRAY) ×4 IMPLANT
KIT TURNOVER KIT B (KITS) ×4 IMPLANT
NEEDLE 22X1 1/2 (OR ONLY) (NEEDLE) IMPLANT
NEEDLE HYPO 25GX1X1/2 BEV (NEEDLE) IMPLANT
NS IRRIG 1000ML POUR BTL (IV SOLUTION) ×4 IMPLANT
PACK ORTHO EXTREMITY (CUSTOM PROCEDURE TRAY) ×4 IMPLANT
PAD ARMBOARD 7.5X6 YLW CONV (MISCELLANEOUS) ×8 IMPLANT
PAD CAST 4YDX4 CTTN HI CHSV (CAST SUPPLIES) ×2 IMPLANT
PADDING CAST COTTON 4X4 STRL (CAST SUPPLIES) ×2
PADDING CAST COTTON 6X4 STRL (CAST SUPPLIES) ×12 IMPLANT
PIN HALF 5.0X200MM (EXFIX) ×8 IMPLANT
PIN TRANSFIXING 5.0 (EXFIX) ×4 IMPLANT
SET HNDPC FAN SPRY TIP SCT (DISPOSABLE) IMPLANT
SPONGE LAP 18X18 RF (DISPOSABLE) ×4 IMPLANT
STAPLER VISISTAT 35W (STAPLE) IMPLANT
STOCKINETTE IMPERVIOUS LG (DRAPES) ×4 IMPLANT
SUCTION FRAZIER HANDLE 10FR (MISCELLANEOUS) ×2
SUCTION TUBE FRAZIER 10FR DISP (MISCELLANEOUS) ×2 IMPLANT
SUT ETHILON 3 0 PS 1 (SUTURE) IMPLANT
SUT VIC AB 2-0 CT1 27 (SUTURE)
SUT VIC AB 2-0 CT1 TAPERPNT 27 (SUTURE) IMPLANT
SYR CONTROL 10ML LL (SYRINGE) IMPLANT
TOWEL OR 17X24 6PK STRL BLUE (TOWEL DISPOSABLE) ×8 IMPLANT
TOWEL OR 17X26 10 PK STRL BLUE (TOWEL DISPOSABLE) ×8 IMPLANT
TUBE CONNECTING 12'X1/4 (SUCTIONS) ×1
TUBE CONNECTING 12X1/4 (SUCTIONS) ×3 IMPLANT
UNDERPAD 30X30 (UNDERPADS AND DIAPERS) ×4 IMPLANT
WATER STERILE IRR 1000ML POUR (IV SOLUTION) ×8 IMPLANT
YANKAUER SUCT BULB TIP NO VENT (SUCTIONS) ×4 IMPLANT
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
## 2018-06-02 NOTE — Anesthesia Preprocedure Evaluation (Addendum)
Anesthesia Evaluation  Patient identified by MRN, date of birth, ID band Patient confused    Reviewed: Allergy & Precautions, NPO status , Patient's Chart, lab work & pertinent test results, Unable to perform ROS - Chart review only  History of Anesthesia Complications Negative for: history of anesthetic complications  Airway Mallampati: II  TM Distance: >3 FB Neck ROM: Full    Dental  (+) Dental Advisory Given   Pulmonary shortness of breath, asthma , COPD, Current Smoker,    + rhonchi  + decreased breath sounds      Cardiovascular hypertension, + Past MI   Rhythm:Regular     Neuro/Psych PSYCHIATRIC DISORDERS Anxiety Depression  Neuromuscular disease    GI/Hepatic GERD  ,  Endo/Other  diabetes, Poorly Controlled, Type 2Morbid obesity  Renal/GU CRFRenal disease     Musculoskeletal   Abdominal   Peds  Hematology   Anesthesia Other Findings   Reproductive/Obstetrics                             Anesthesia Physical Anesthesia Plan  ASA: III  Anesthesia Plan: General   Post-op Pain Management:    Induction: Intravenous  PONV Risk Score and Plan: 2 and Ondansetron and Dexamethasone  Airway Management Planned: Oral ETT  Additional Equipment: None  Intra-op Plan:   Post-operative Plan: Extubation in OR and Possible Post-op intubation/ventilation  Informed Consent: I have reviewed the patients History and Physical, chart, labs and discussed the procedure including the risks, benefits and alternatives for the proposed anesthesia with the patient or authorized representative who has indicated his/her understanding and acceptance.     Dental advisory given  Plan Discussed with: CRNA and Surgeon  Anesthesia Plan Comments:         Anesthesia Quick Evaluation

## 2018-06-02 NOTE — Progress Notes (Signed)
Patient transported from PACU to [phone] without any apparent complications.

## 2018-06-02 NOTE — Progress Notes (Addendum)
Five rings given to Junie Panning, RN from [employer]. Patient arrives on NRB with CRNA and then placed on BiPAP by RT. Dr. Erlinda Hong states consent will be emergency consent.

## 2018-06-02 NOTE — Anesthesia Procedure Notes (Signed)
Procedure Name: Intubation Date/Time: 06/02/2018 3:35 PM Performed by: Barrington Ellison, CRNA Pre-anesthesia Checklist: Patient identified, Emergency Drugs available, Suction available and Patient being monitored Patient Re-evaluated:Patient Re-evaluated prior to induction Oxygen Delivery Method: Circle System Utilized Preoxygenation: Pre-oxygenation with 100% oxygen Induction Type: IV induction and Rapid sequence Ventilation: Mask ventilation without difficulty Laryngoscope Size: Mac and 3 Grade View: Grade I Tube type: Oral Tube size: 7.0 mm Number of attempts: 1 Airway Equipment and Method: Stylet and Oral airway Placement Confirmation: ETT inserted through vocal cords under direct vision,  positive ETCO2 and breath sounds checked- equal and bilateral Secured at: 21 cm Tube secured with: Tape Dental Injury: Teeth and Oropharynx as per pre-operative assessment

## 2018-06-02 NOTE — ED Notes (Signed)
Pt actively trying to get out of bed.

## 2018-06-02 NOTE — Progress Notes (Signed)
Patient ID: [name], female   DOB: 31-Jul-1965, 53 y.o.   MRN: [id number] Dr. Erlinda Hong is aware of this patient and will see this AM. Likely will need ORIF right ankle fracture.

## 2018-06-02 NOTE — Progress Notes (Signed)
CXR OK--no PNX

## 2018-06-02 NOTE — ED Provider Notes (Signed)
Medical screening examination/treatment/procedure(s) were conducted as a shared visit with non-physician practitioner(s) and myself.  I personally evaluated the patient during the encounter.  None   Patient is a 53 year old female with history of morbid obesity, COPD on 3 L of oxygen, previous left BKA who by Dr. Erlinda Hong who presents emergency department after mechanical fall at home with obvious right ankle deformity.  She has a skin tear to the medial ankle with tenting of the skin.  This does not appear to be an open fracture.  X-ray shows bimalleolar fracture with talus dislocation.  Patient also hyperglycemic but this has improved with oral dextrose.  Labs show leukocytosis which is likely reactive.  She denies any fevers, chest pain, worsening of her chronic shortness of breath, change in her chronic cough.  States she did not hit her head when she fell.  She is not on blood thinners.  Patient reports chronic neuropathy in the lower extremity.  Reduction performed without sedation given patient states she could not feel the ankle and due to her significant chronic medical history.  Patient had DP pulse before and after procedure.  Performed emergently secondary to skin tenting.  Here in the emergency department patient seems confused.  She is oriented x3 but continues to try to get out of bed and walk on her ankle despite being told several times that is fractured, unstable and in a splint.  She states that she thinks she is in the bathroom when she is lying in her bed.  Her ABG does not show respiratory acidosis.  CT head shows no acute abnormality.  Chest x-ray clear.  Patient continues to be intermittently hypoglycemic.  Started D10 infusion and will admit to medicine.  Orthopedics has been consulted.   Reduction of fracture Date/Time: 06/02/2018 12:43 AM Performed by: Ameliya Nicotra, Delice Bison, DO Authorized by: Barnes Florek, Delice Bison, DO  Consent: The procedure was performed in an emergent situation. Verbal consent  obtained. Consent given by: patient Patient understanding: patient states understanding of the procedure being performed Patient consent: the patient's understanding of the procedure matches consent given Procedure consent: procedure consent matches procedure scheduled Relevant documents: relevant documents present and verified Test results: test results available and properly labeled Site marked: the operative site was marked Imaging studies: imaging studies available Required items: required blood products, implants, devices, and special equipment available Patient identity confirmed: verbally with patient Time out: Immediately prior to procedure a "time out" was called to verify the correct patient, procedure, equipment, support staff and site/side marked as required. Local anesthesia used: no  Anesthesia: Local anesthesia used: no  Sedation: Patient sedated: no  Patient tolerance: Patient tolerated the procedure well with no immediate complications       Shaquira Moroz, Delice Bison, DO 06/02/18 0235

## 2018-06-02 NOTE — Progress Notes (Signed)
Orthopedic Tech Progress Note Patient Details:  [name] 12/10/1965 [id number]  Ortho Devices Type of Ortho Device: Post (short leg) splint, Stirrup splint Ortho Device/Splint Location: rle. applied post reduction with drs assistance. Ortho Device/Splint Interventions: Ordered, Application, Adjustment   Post Interventions Patient Tolerated: Well Instructions Provided: Care of device, Adjustment of device   [name] 06/02/2018, 12:44 AM

## 2018-06-02 NOTE — Progress Notes (Signed)
Pt adm to PACU with new left IJ DL CVC.  Proximal port is accessed, distal is capped & clamped. Dsg CD&I, occlusive w/ BIOPATCH. CXR done for placement.

## 2018-06-02 NOTE — ED Notes (Signed)
Patient is currently sleeping.
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
## 2018-06-02 NOTE — Op Note (Addendum)
   Date of Surgery: 06/02/2018  INDICATIONS: [name] is a 53 y.o.-year-old female who sustained a right bimalleolar ankle fracture dislocation; she was indicated for external fixation due to the displaced and unstable nature of the fracture and for severe severe swelling and fracture blisters and came to the operating room today for this procedure. The patient did consent to the procedure after discussion of the risks and benefits.   PREOPERATIVE DIAGNOSIS: right bimalleolar ankle fracture dislocation with severe swelling and fracture blisters  POSTOPERATIVE DIAGNOSIS: Same.  PROCEDURE: External fixation right lower extremity, CPT 20692 multiplane  SURGEON: N. Eduard Roux, M.D.  ASSIST: Ciro Backer [place], [place]; necessary for the timely completion of procedure and due to complexity of procedure.  ANESTHESIA: general  IV FLUIDS AND URINE: See anesthesia.  ESTIMATED BLOOD LOSS: minimal mL.  IMPLANTS: Zimmer  DRAINS: None.  COMPLICATIONS: None.  DESCRIPTION OF PROCEDURE: The patient was identified in the preoperative holding area.  The operative site was marked by the surgeon and confirmed by the patient.  He was brought back to the operating room.  Anesthesia was induced by the anesthesia team.  A well padded nonsterile tourniquet was placed. The operative extremity was prepped and draped in standard sterile fashion.  A timeout was performed.  Preoperative antibiotics were given.  She had massive swelling with multiple fracture blisters circumferentially.  The tibial bony landmarks were palpated and the pin sites were marked on the skin.  Incisions were made on the anterior cortex of the tibia.   Each Schanz pin was placed in the same fashion -- first drilling with the 3.5 mm drill while copiously irrigating, then hand placing the pin.  A third transverse pin was place into the calcaneus from medial to lateral using fluoro.  This was confirmed on x-ray on both views.  The ex-fix clamps  were placed onto pins and the fracture was pulled into the proper alignment.  The clamps were completely tightened.   Final x-rays were taken in AP and lateral views to confirm the reduction and pin lengths. The wounds were cleaned and dried a final time and a sterile dressing consisting of Xeroform and kerlix was placed.  The patient was then transferred back to the bed and left the operating room in stable condition.  All sponge and instrument counts were correct.  POSTOPERATIVE PLAN: Ms. Gockel will remain non weight bearing with the leg elevated.  she will return to the operating room for definitive fixation when the swelling has gone down which will likely take at least a week. She will need to be on DVT prophylaxis per primary team.  She will begin pin site care in the morning twice a day.  Azucena Cecil, MD [place] 3:53 PM

## 2018-06-02 NOTE — Transfer of Care (Signed)
Immediate Anesthesia Transfer of Care Note  Patient: Sarahjane Matherly Blayney  Procedure(s) Performed: EXTERNAL FIXATION LEG (Right Ankle)  Patient Location: PACU  Anesthesia Type:General  Level of Consciousness: drowsy and patient cooperative  Airway & Oxygen Therapy: Patient Spontanous Breathing and placed on BiPap by RT, per plan  Post-op Assessment: Report given to RN  Post vital signs: Reviewed and stable  Last Vitals:  Vitals Value Taken Time  BP 135/110 06/02/2018  4:21 PM  Temp    Pulse 96 06/02/2018  4:23 PM  Resp 21 06/02/2018  4:23 PM  SpO2 100 % 06/02/2018  4:23 PM  Vitals shown include unvalidated device data.  Last Pain:  Vitals:   06/02/18 1203  TempSrc: Axillary  PainSc:          Complications: No apparent anesthesia complications
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
## 2018-06-02 NOTE — Progress Notes (Signed)
Pt arrived to [facility] at 0700 from ED on bipap, pt is lethargic withdraws from pain but unable to answer questions at this time. Pt placed on low bed. Skin has scattered brusing on arms abdomen and legs, Left BKA, and abrasion on right leg, and peeling skin  on arms and legs, elbows red but blanchable. Right ankle has ace wrap from toes to knee unable to asses underneath at this time, will continue to monitor.

## 2018-06-02 NOTE — Progress Notes (Addendum)
Paged NP Baltazar Najjar at 2102, Pt. needs pain meds,no PRN.Surgical RLE ext fixation today.On Bipap.Prior somnolence;now A&O3-4. Administered PRN Tramadol.   Patient frequently disconnected bipap tubing, educated patient on need to keep bipap on. Attempted to readjust bipap mask to ease discomfort to help ensure patient kept bipap on. Informed RT to assist with adjusting bipap.    RT removed bipap to admin oral night meds. Patient no nausea, vomiting or change in resp status when admin oral meds. Informed by RT need RN to assist with transport to CT. No RN availalble at time.  Notified CT at [facility] patient still not ready to transport, needs patient care and disconnection from IV. Confirmed ok to CT with ext fixator. Spoke with RT via phone, since patient is Bipap patient, need to be transported with RN.  RT placed patient on 3L; saturation of 94-96%. Patient alert and talking.   Called RT at [facility] with assisting to transport patient to CT. Left floor at 0233 and returned at 0325. See RT note.   Page Baltazar Najjar at [phone]. Indicated patient in pain, however informed to avoid narcotics due to prior somnolence. Tramadol order q 12 hr. Pt. now off bipap;3L [place]. Inquired if ok to give PRN oxy. No action.  Patient oxygen saturation in high 80's and low 90's during sleep with 3L [place]. RT assist with returning patient to bipap

## 2018-06-02 NOTE — Progress Notes (Signed)
Patient transported to short stay on a NRB and then placed on bipap. Patient will be going into the OR on the bipap machine.

## 2018-06-02 NOTE — ED Notes (Signed)
Patient is very difficult to arrouse, Dr. Alcario Drought at bedside. ABG ordered.

## 2018-06-02 NOTE — Progress Notes (Addendum)
Inpatient Diabetes Program Recommendations  AACE/ADA: New Consensus Statement on Inpatient Glycemic Control (2015)  Target Ranges:  Prepandial:   less than 140 mg/dL      Peak postprandial:   less than 180 mg/dL (1-2 hours)      Critically ill patients:  140 - 180 mg/dL   Lab Results  Component Value Date   GLUCAP 166 (H) 06/02/2018   HGBA1C 8.6 (H) 01/21/2018    Review of Glycemic Control Results for [name], [name] (MRN [id number]) as of 06/02/2018 09:19  Ref. Range 06/02/2018 05:29 06/02/2018 06:48 06/02/2018 08:57  Glucose-Capillary Latest Ref Range: 70 - 99 mg/dL 150 (H) 153 (H) 166 (H)   Diabetes history: Type 2 DM Outpatient Diabetes medications: Invokana 100 mg QD, Toujeo 60 units BID, Humulin R- U-500- 45 units with breakfast/ 30 units with lunch/15 units with snacks Current orders for Inpatient glycemic control: none  Inpatient Diabetes Program Recommendations:   Addendum @1230 : attempted to call patient. Glucose trends increasing and anticipate Decadron administration given OR today. Consider adding Novolog 0-9 units TID and Levemir 12 units QHS.   Noted severe hypoglycemia on arrival, question if this contributed to patient's fall, especially given doses of insulin per home regimen.   Last saw Dr Dwyane Dee, endocrinology on 05/30/2018. Per note, he had difficulty determining glucose trends, however noted glucoses were on the lower side of normal in the evening and thus reduced dinnertime dose. Unclear if patient did this, will reach out to patient today.    Thanks, Bronson Curb, MSN, RNC-OB Diabetes Coordinator [phone] (8a-5p)

## 2018-06-02 NOTE — ED Notes (Signed)
Consistently trying get out of the bed. After being advised multiple times to stay in bed.

## 2018-06-03 ENCOUNTER — Inpatient Hospital Stay (HOSPITAL_COMMUNITY): Payer: Medicare HMO

## 2018-06-03 ENCOUNTER — Encounter (HOSPITAL_COMMUNITY): Payer: Self-pay | Admitting: Orthopaedic Surgery

## 2018-06-03 LAB — BLOOD GAS, ARTERIAL
Acid-Base Excess: 5.6 mmol/L — ABNORMAL HIGH (ref 0.0–2.0)
Acid-Base Excess: 3.9 mmol/L — ABNORMAL HIGH (ref 0.0–2.0)
Bicarbonate: 30.6 mmol/L — ABNORMAL HIGH (ref 20.0–28.0)
Bicarbonate: 29.7 mmol/L — ABNORMAL HIGH (ref 20.0–28.0)
Drawn by: 55062
Drawn by: 336831
O2 Content: 3 L/min
O2 Content: 3 L/min
O2 Saturation: 89.9 %
O2 Saturation: 94.2 %
Patient temperature: 98.6
Patient temperature: 98.6
pCO2 arterial: 54.4 mmHg — ABNORMAL HIGH (ref 32.0–48.0)
pCO2 arterial: 61.1 mmHg — ABNORMAL HIGH (ref 32.0–48.0)
pH, Arterial: 7.369 (ref 7.350–7.450)
pH, Arterial: 7.308 — ABNORMAL LOW (ref 7.350–7.450)
pO2, Arterial: 58.1 mmHg — ABNORMAL LOW (ref 83.0–108.0)
pO2, Arterial: 73.4 mmHg — ABNORMAL LOW (ref 83.0–108.0)

## 2018-06-03 LAB — BASIC METABOLIC PANEL
Anion gap: 12 (ref 5–15)
BUN: 14 mg/dL (ref 6–20)
CO2: 28 mmol/L (ref 22–32)
Calcium: 8.7 mg/dL — ABNORMAL LOW (ref 8.9–10.3)
Chloride: 101 mmol/L (ref 98–111)
Creatinine, Ser: 1.09 mg/dL — ABNORMAL HIGH (ref 0.44–1.00)
GFR calc Af Amer: >60 mL/min (ref >60)
GFR calc non Af Amer: 58 mL/min — ABNORMAL LOW (ref >60)
Glucose, Bld: 208 mg/dL — ABNORMAL HIGH (ref 70–99)
Potassium: 3.5 mmol/L (ref 3.5–5.1)
Sodium: 141 mmol/L (ref 135–145)

## 2018-06-03 LAB — CBC
HCT: 35.4 % — ABNORMAL LOW (ref 36.0–46.0)
Hemoglobin: 10.8 g/dL — ABNORMAL LOW (ref 12.0–15.0)
MCH: 28.4 pg (ref 26.0–34.0)
MCHC: 30.5 g/dL (ref 30.0–36.0)
MCV: 93.2 fL (ref 80.0–100.0)
Platelets: 186 10*3/uL (ref 150–400)
RBC: 3.8 MIL/uL — ABNORMAL LOW (ref 3.87–5.11)
RDW: 17.2 % — ABNORMAL HIGH (ref 11.5–15.5)
WBC: 12.9 10*3/uL — ABNORMAL HIGH (ref 4.0–10.5)
nRBC: 0 % (ref 0.0–0.2)

## 2018-06-03 LAB — GLUCOSE, CAPILLARY
Glucose-Capillary: 34 mg/dL — CL (ref 70–99)
Glucose-Capillary: 174 mg/dL — ABNORMAL HIGH (ref 70–99)
Glucose-Capillary: 183 mg/dL — ABNORMAL HIGH (ref 70–99)
Glucose-Capillary: 164 mg/dL — ABNORMAL HIGH (ref 70–99)
Glucose-Capillary: 153 mg/dL — ABNORMAL HIGH (ref 70–99)

## 2018-06-03 MED ORDER — ENOXAPARIN SODIUM 40 MG/0.4ML ~~LOC~~ SOLN
40.0000 mg | SUBCUTANEOUS | Status: DC
  Administered 2018-06-03 – 2018-06-23 (×21): 40 mg via SUBCUTANEOUS
  Filled 2018-06-03 (×21): qty 0.4

## 2018-06-03 NOTE — Progress Notes (Signed)
Patient taken off bipap and placed on 5L [place]. Patient maintaining saturations in mid 90's. Patient complaining of 10/10 pain. PRN Tramadol administered. Patient experiencing small amount of bloody drainage from pin sites. RLE elevated on pillow with intermittent ice application. Will continue to monitor.   Hiram Comber, RN 06/03/2018 [id number] AM

## 2018-06-03 NOTE — Progress Notes (Signed)
Subjective: 1 Day Post-Op Procedure(s) (LRB): EXTERNAL FIXATION LEG (Right) Patient reports pain as patient resting and on bipap  Objective: Vital signs in last 24 hours: Temp:  [97 F (36.1 C)-97.8 F (36.6 C)] 97.7 F (36.5 C) (06/02 0403) Pulse Rate:  [83-108] 108 (06/02 0758) Resp:  [13-20] 19 (06/02 0758) BP: (92-156)/(67-93) 126/85 (06/02 0758) SpO2:  [89 %-100 %] 94 % (06/02 0758) FiO2 (%):  [40 %-50 %] 50 % (06/02 1115)  Intake/Output from previous day: 06/01 0701 - 06/02 0700 In: 1124.4 [I.V.:1044.4] Out: 15 [Blood:15] Intake/Output this shift: No intake/output data recorded.  Recent Labs    06/01/18 2240 06/02/18 0115 06/02/18 0446 06/03/18 0628  HGB 13.0 12.9 12.9 10.8*   Recent Labs    06/01/18 2240  06/02/18 0446 06/03/18 0628  WBC 17.9*  --   --  12.9*  RBC 4.52  --   --  3.80*  HCT 41.6   < > 38.0 35.4*  PLT 261  --   --  186   < > = values in this interval not displayed.   Recent Labs    06/01/18 2240  06/02/18 0446 06/03/18 0628  NA 144   < > 143 141  K 3.4*   < > 3.4* 3.5  CL 109  --   --  101  CO2 27  --   --  28  BUN 17  --   --  14  CREATININE 0.92  --   --  1.09*  GLUCOSE 46*  --   --  208*  CALCIUM 8.2*  --   --  8.7*   < > = values in this interval not displayed.   No results for input(s): LABPT, INR in the last 72 hours.  Neurologically intact Neurovascular intact Sensation intact distally Intact pulses distally Dorsiflexion/Plantar flexion intact Incision: dressing C/D/I No cellulitis present Compartment soft  Ex-fix in place Slight to moderate drainage to fracture blisters   Assessment/Plan: 1 Day Post-Op Procedure(s) (LRB): EXTERNAL FIXATION LEG (Right) NWB LLE Continue with pin site care to ex fix bid as well as apply xeroform to all fracture blisters Continue to elevate LLE at all times Will hopefully be able to definitively treat in a week or so (depending on swelling)        Aundra Dubin 06/03/2018, 8:11 AM

## 2018-06-03 NOTE — Progress Notes (Signed)
Placed patient back on BIPAP, patient desaturated to lower 80's. RN informed will continue to monitor pt.

## 2018-06-03 NOTE — Progress Notes (Signed)
Patient placed back on bipap per RT.
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
## 2018-06-03 NOTE — Anesthesia Postprocedure Evaluation (Addendum)
Anesthesia Post Note  Patient: [name]  Procedure(s) Performed: EXTERNAL FIXATION LEG (Right Ankle)     Patient location during evaluation: PACU Anesthesia Type: General Level of consciousness: awake and patient cooperative Pain management: pain level controlled Vital Signs Assessment: post-procedure vital signs reviewed and stable Respiratory status: spontaneous breathing and nonlabored ventilation (BipAP) Cardiovascular status: blood pressure returned to baseline and stable Postop Assessment: no apparent nausea or vomiting Anesthetic complications: no    Last Vitals:  Vitals:   06/03/18 1139 06/03/18 1202  BP: 121/70   Pulse: 88 89  Resp: 19 19  Temp:    SpO2: 98% 95%    Last Pain:  Vitals:   06/03/18 1002  TempSrc:   PainSc: Asleep                 Rohini Jaroszewski

## 2018-06-03 NOTE — Progress Notes (Addendum)
Patient resting in bed comfortably without any complaints. Tolerating being on 3L [place] without desaturations. Pin care provided. RLE elevated on two pillows. Will continue to monitor.   Hiram Comber, RN 06/03/2018 11:54  AM

## 2018-06-03 NOTE — Progress Notes (Signed)
Inpatient Diabetes Program Recommendations  AACE/ADA: New Consensus Statement on Inpatient Glycemic Control (2015)  Target Ranges:  Prepandial:   less than 140 mg/dL      Peak postprandial:   less than 180 mg/dL (1-2 hours)      Critically ill patients:  140 - 180 mg/dL   Lab Results  Component Value Date   GLUCAP 183 (H) 06/03/2018   HGBA1C 8.6 (H) 01/21/2018    Review of Glycemic Control Results for [name], [name] (MRN [id number]) as of 06/03/2018 15:26  Ref. Range 06/02/2018 22:01 06/03/2018 09:08 06/03/2018 11:48  Glucose-Capillary Latest Ref Range: 70 - 99 mg/dL 218 (H) 174 (H) 183 (H)   Diabetes history: Type 2 DM Outpatient Diabetes medications: Invokana 100 mg QD, Toujeo 60 units BID, Humulin R- U-500- 45 units with breakfast/ 30 units with lunch/15 units with snacks Current orders for Inpatient glycemic control: none  Inpatient Diabetes Program Recommendations:    Consider increasing Levemir to 14 units QHS.   Attempted to see patient again, not able to answer questions appropriately at this time. Would barely rouse when name called and quickly returned to sleep.  Given severe hypoglycemia on arrival and admission for fall, would be very guarded on discharging on home regimen, compared to inpatient needs.  Will continue to follow.   Thanks, Bronson Curb, MSN, RNC-OB Diabetes Coordinator [phone] (8a-5p)

## 2018-06-03 NOTE — Progress Notes (Signed)
Patient has been increasing lethargic throughout the shift. Will arouse to voice and answer a couple questions but will go back to sleep shortly after. Dr. [place] made aware of situation. Orders place. Will continue to monitor.    Hiram Comber, RN 06/03/2018 5:37 PM

## 2018-06-03 NOTE — Anesthesia Procedure Notes (Signed)
Central Venous Catheter Insertion Performed by: Oleta Mouse, MD, anesthesiologist Start/End6/01/2018 2:42 PM, 06/02/2018 2:48 PM Patient location: Pre-op. Preanesthetic checklist: patient identified, IV checked, site marked, risks and benefits discussed, surgical consent, monitors and equipment checked, pre-op evaluation, timeout performed and anesthesia consent Lidocaine 1% used for infiltration and patient sedated Hand hygiene performed  and maximum sterile barriers used  Catheter size: 8 Fr Total catheter length 16. Central line was placed.Double lumen Procedure performed using ultrasound guided technique. Ultrasound Notes:anatomy identified, needle tip was noted to be adjacent to the nerve/plexus identified, no ultrasound evidence of intravascular and/or intraneural injection and image(s) printed for medical record Attempts: 1 Following insertion, dressing applied, line sutured and Biopatch. Post procedure assessment: blood return through all ports, free fluid flow and no air  Patient tolerated the procedure well with no immediate complications.

## 2018-06-03 NOTE — Progress Notes (Signed)
Patient currently off bipap and on 3L [place], patient is alert and oriented, sats are 94% with all other vital signs stable. Will continue to monitor pt.

## 2018-06-03 NOTE — Progress Notes (Addendum)
Notified by tele sitter patient removed bipap at 2234. Educated patient on need to keep bipap on. Placed bipap back on patient.   Held Lyrica given patient drowsy at baseline (although arousable) and held Singulair due to drowsiness and on Bipap.   Approx. 2335 patient removed Bipap. Patient reaching and pulling at [facility] monitor. Inquired if patient needed something and stated she need to get up (discomfort). Repositioned patient. Placed patient back on Bipap.   Bipap removed at 0200 while adjusting face mask since patient requested pain med. Applied [place], patient 96% on 3L; RT notified. Returned Bipap at [phone] with RT present.   CHG bath completed at 0630. Ice reapplied to RLE.   Informed day RN patient discuss pain mgmt options with attending since PRN Tramadol is q 12 hr and minimizing use of narcotics.

## 2018-06-03 NOTE — Progress Notes (Signed)
Transported patient to C.T scan while patient was on 3lpm. Sp02 91-96% during transport, patient had no c/0 being short of breath.

## 2018-06-04 ENCOUNTER — Telehealth (HOSPITAL_COMMUNITY): Payer: Self-pay | Admitting: Pharmacist

## 2018-06-04 LAB — CBC
HCT: 33.4 % — ABNORMAL LOW (ref 36.0–46.0)
Hemoglobin: 10.2 g/dL — ABNORMAL LOW (ref 12.0–15.0)
MCH: 28.8 pg (ref 26.0–34.0)
MCHC: 30.5 g/dL (ref 30.0–36.0)
MCV: 94.4 fL (ref 80.0–100.0)
Platelets: 157 10*3/uL (ref 150–400)
RBC: 3.54 MIL/uL — ABNORMAL LOW (ref 3.87–5.11)
RDW: 17.4 % — ABNORMAL HIGH (ref 11.5–15.5)
WBC: 11.9 10*3/uL — ABNORMAL HIGH (ref 4.0–10.5)
nRBC: 0 % (ref 0.0–0.2)

## 2018-06-04 LAB — BASIC METABOLIC PANEL
Anion gap: 13 (ref 5–15)
BUN: 14 mg/dL (ref 6–20)
CO2: 27 mmol/L (ref 22–32)
Calcium: 8.7 mg/dL — ABNORMAL LOW (ref 8.9–10.3)
Chloride: 101 mmol/L (ref 98–111)
Creatinine, Ser: 1.04 mg/dL — ABNORMAL HIGH (ref 0.44–1.00)
GFR calc Af Amer: >60 mL/min (ref >60)
GFR calc non Af Amer: >60 mL/min (ref >60)
Glucose, Bld: 163 mg/dL — ABNORMAL HIGH (ref 70–99)
Potassium: 3.8 mmol/L (ref 3.5–5.1)
Sodium: 141 mmol/L (ref 135–145)

## 2018-06-04 LAB — MAGNESIUM: Magnesium: 1.7 mg/dL (ref 1.7–2.4)

## 2018-06-04 LAB — GLUCOSE, CAPILLARY
Glucose-Capillary: 136 mg/dL — ABNORMAL HIGH (ref 70–99)
Glucose-Capillary: 231 mg/dL — ABNORMAL HIGH (ref 70–99)
Glucose-Capillary: 232 mg/dL — ABNORMAL HIGH (ref 70–99)
Glucose-Capillary: 240 mg/dL — ABNORMAL HIGH (ref 70–99)
Glucose-Capillary: 241 mg/dL — ABNORMAL HIGH (ref 70–99)

## 2018-06-04 MED ORDER — BICTEGRAVIR-EMTRICITAB-TENOFOV 50-200-25 MG PO TABS: 1.0000 | ORAL_TABLET | Freq: Every day | ORAL | 5 refills | Status: DC

## 2018-06-04 MED ORDER — BICTEGRAVIR-EMTRICITAB-TENOFOV 50-200-25 MG PO TABS
1.0000 | ORAL_TABLET | Freq: Every day | ORAL | Status: DC
  Administered 2018-06-05 – 2018-06-23 (×19): 1 via ORAL
  Filled 2018-06-04 (×19): qty 1

## 2018-06-04 MED ORDER — PHENYLEPHRINE HCL-NACL 10-0.9 MG/250ML-% IV SOLN
INTRAVENOUS | Status: AC
  Filled 2018-06-04: qty 500

## 2018-06-04 MED ORDER — IPRATROPIUM-ALBUTEROL 0.5-2.5 (3) MG/3ML IN SOLN
3.0000 mL | Freq: Two times a day (BID) | RESPIRATORY_TRACT | Status: DC
  Administered 2018-06-04 – 2018-06-09 (×10): 3 mL via RESPIRATORY_TRACT
  Filled 2018-06-04 (×10): qty 3

## 2018-06-04 NOTE — Progress Notes (Signed)
PROGRESS NOTE    [name]  WPY:[id number] DOB: May 12, 1965 DOA: 06/01/2018 PCP: [name], PA-C    Brief Narrative:  53 y.o.femalewith medical history significant ofsevere COPD on home O2, HVOS, CKD, HIV, DM2 s/p L BKA.  Patient suffered mechanical fall at home. Didn't hit head in fall. Has obvious ankle deformity but apparently was able to walk on ankle without significant pain secondary to her very advanced diabetic neuropathy.  Per EDP notes: Patient states APS did come out and checked her and did not see anything concerning so they left but cousin came later and called EMS. Patient denies any other injuries, head trauma, LOC, etc. She states she has isolated pain in right ankle. She has not tried to get up and move around. She is generally wheelchair bound due to left BKA with prosthesis. Patient states otherwise she has been well.  CBG was low with EMS so they gave PO glucose, additionally CBG was 54 on arrival so she got D50 (would bottom out again at 32 later this morning necessitating further D50 and a D10 gtt).  She was reportedly confused and kept trying to get out of bed and walk despite the ankle fracture in ED. Reduction was performed without sedation and no pain meds were given secondary to her not having much ankle pain due to diabetic neuropathy. Had dopplerable DP post reduction per EDP. CT head neg, CXR neg. On my evaluation however, patient is lethargic and difficult to arouse (see physical exam below).  COVID negative.  Assessment & Plan:   Principal Problem:   Displaced bimalleolar fracture of right ankle, closed, initial encounter Active Problems:   Human immunodeficiency virus (HIV) disease ([place])   Diabetes mellitus type 2 with complications, uncontrolled ([place])   COPD (chronic obstructive pulmonary disease) ([place])   Obesity hypoventilation syndrome ([name])   Acute metabolic encephalopathy   Acute on chronic respiratory failure with  hypoxia and hypercapnia ([name])   CKD (chronic kidney disease), stage III ([name])  Acute metabolic encephalopathy Acute hypoxic hypercapnic respiratory failure OSA versus obesity hypoventilation syndrome Patient with significant COPD on home oxygen at 3 L per nasal cannula.   Following surgical intervention of her right ankle fracture, patient became hypoxic and encephalopathic likely secondary to respiratory failure.   Chest x-ray with no acute cardiopulmonary findings.  COVID-19 test negative.  Patient likely suffers from obesity hypoventilation syndrome in addition to OSA.   Weaned off bipap overnight  Rightbimalleolar anklefracture with dislocation  Patient with apparent fall at home, is typically wheelchair-bound.  X-ray right ankle notable for acute comminuted distal fibular fracture with lateral angulation and displaced medial malleolar fracture. --[facility] following, appreciate assistance --Underwent external fixation on 06/03/2018 --Ortho planned staged ORIF in a week planned --Continue nonweightbearing status to right lower extremity --Tramadol/oxycodone PRN  Hypoglycemia IDDM Hemoglobin A1c 8.6 on January 2020.  Follows with endocrine, Dr. Dwyane Dee outpatient.  Home regimen includes Toujeo 50 units twice daily, Humulin  R U-500 45u breakfast, 30u Lunch and 15u prior to snacks; also on Invokana.  Patient with blood glucose of 32 at 0230 on 06/02/2018.  Etiology likley 2/2 prolonged n.p.o. status for ankle fracture with insulin likely contributing to this event.  Patient was started on D10 and switch to D5 half-normal saline. --Currently on Lantus 12u qHS --Novolog sensitive insulin sliding scale for coverage --Continue to monitor glucose curve and adjust as needed  CKD stage III Creatinine 1.09.  Within baseline. --Avoid nephrotoxins, renally dose all medications --Repeat bmet  in AM  HIV Follows with infectious disease, Dr. Johnnye Sima outpatient.  Last HIV RNA  quant undetectable 05/01/2018. --Continue HAART as tolerated  COPD, severe Tobacco abuse disorder Follows with pulmonology, Dr. Halford Chessman outpatient.  Spirometry March 2020 with an FEV1 of 1.1 which is 35% of predicted.  On Trelegy Ellipta at home and oxygen at 3L/min --Continue supplemental oxygen, titrate for SPO2 greater than 88% --Cont bronchodilator as needed  HLD: Continue Crestor  Peripheral neuropathy: Continue Lyrica 225 mg p.o. twice daily  Morbid obesity Discussed with patient need for aggressive weight loss measures as this complicates all facets of care. Remains stable at this time  DVT prophylaxis: Lovenox subQ Code Status: DNR Family Communication: Pt in room, family not at bedside Disposition Plan: Uncertain at this time  Consultants:   Orthopedics -[facility], Dr. Louanne Skye, Dr. Erlinda Hong  Procedures:   External fixation right malleoli are ankle fracture on 06/02/2018  Pending staged ORIF  Antimicrobials: Anti-infectives (From admission, onward)   Start     Dose/Rate Route Frequency Ordered Stop   06/05/18 1000  bictegravir-emtricitabine-tenofovir AF (BIKTARVY) 50-200-25 MG per tablet 1 tablet     1 tablet Oral Daily 06/04/18 1216     06/04/18 0000  bictegravir-emtricitabine-tenofovir AF (BIKTARVY) 50-200-25 MG TABS tablet     1 tablet Oral Daily 06/04/18 1217     06/02/18 1500  ceFAZolin (ANCEF) IVPB 2g/100 mL premix    Note to Pharmacy:  Anesthesia to give preop   2 g 200 mL/hr over 30 Minutes Intravenous  Once 06/02/18 1451 06/02/18 1516   06/02/18 1452  ceFAZolin (ANCEF) 2-4 GM/100ML-% IVPB    Note to Pharmacy:  Henrine Screws   : cabinet override      06/02/18 1452 06/02/18 1516   06/02/18 1000  elvitegravir-cobicistat-emtricitabine-tenofovir (GENVOYA) 150-150-200-10 MG tablet 1 tablet  Status:  Discontinued     1 tablet Oral Daily 06/02/18 0256 06/04/18 1216       Subjective: Without complaints this AM  Objective: Vitals:   06/04/18 0800  06/04/18 0804 06/04/18 0840 06/04/18 1433  BP:   (!) 149/83 132/84  Pulse: 79  86 91  Resp: 14  17 14   Temp:   98.1 F (36.7 C)   TempSrc:   Oral   SpO2: 100% 99% 100% 100%  Weight:      Height:        Intake/Output Summary (Last 24 hours) at 06/04/2018 1453 Last data filed at 06/04/2018 0215 Gross per 24 hour  Intake 780 ml  Output --  Net 780 ml   Filed Weights   06/01/18 2224  Weight: 106.1 kg    Examination:  General exam: Appears calm and comfortable  Respiratory system: Clear to auscultation. Respiratory effort normal. Cardiovascular system: S1 & S2 heard, RRR.  Gastrointestinal system: Morbidly obese, pos BS Central nervous system: Alert and oriented. No focal neurological deficits. Extremities: Symmetric 5 x 5 power, ankle hardware in place Skin: No rashes, lesions  Psychiatry: Judgement and insight appear normal. Mood & affect appropriate.   Data Reviewed: I have personally reviewed following labs and imaging studies  CBC: Recent Labs  Lab 06/01/18 2240 06/02/18 0115 06/02/18 0446 06/03/18 0628 06/04/18 0436  WBC 17.9*  --   --  12.9* 11.9*  NEUTROABS 14.0*  --   --   --   --   HGB 13.0 12.9 12.9 10.8* 10.2*  HCT 41.6 38.0 38.0 35.4* 33.4*  MCV 92.0  --   --  93.2 94.4  PLT  261  --   --  186 858   Basic Metabolic Panel: Recent Labs  Lab 06/01/18 2240 06/02/18 0115 06/02/18 0446 06/03/18 0628 06/04/18 0436  NA 144 143 143 141 141  K 3.4* 3.7 3.4* 3.5 3.8  CL 109  --   --  101 101  CO2 27  --   --  28 27  GLUCOSE 46*  --   --  208* 163*  BUN 17  --   --  14 14  CREATININE 0.92  --   --  1.09* 1.04*  CALCIUM 8.2*  --   --  8.7* 8.7*  MG  --   --   --   --  1.7   GFR: Estimated Creatinine Clearance: 81.5 mL/min (A) (by C-G formula based on SCr of 1.04 mg/dL (H)). Liver Function Tests: No results for input(s): AST, ALT, ALKPHOS, BILITOT, PROT, ALBUMIN in the last 168 hours. No results for input(s): LIPASE, AMYLASE in the last 168  hours. Recent Labs  Lab 06/02/18 0940  AMMONIA 23   Coagulation Profile: No results for input(s): INR, PROTIME in the last 168 hours. Cardiac Enzymes: No results for input(s): CKTOTAL, CKMB, CKMBINDEX, TROPONINI in the last 168 hours. BNP (last 3 results) No results for input(s): PROBNP in the last 8760 hours. HbA1C: No results for input(s): HGBA1C in the last 72 hours. CBG: Recent Labs  Lab 06/03/18 1708 06/03/18 2225 06/04/18 0737 06/04/18 1309 06/04/18 1312  GLUCAP 164* 153* 136* 232* 231*   Lipid Profile: No results for input(s): CHOL, HDL, LDLCALC, TRIG, CHOLHDL, LDLDIRECT in the last 72 hours. Thyroid Function Tests: No results for input(s): TSH, T4TOTAL, FREET4, T3FREE, THYROIDAB in the last 72 hours. Anemia Panel: No results for input(s): VITAMINB12, FOLATE, FERRITIN, TIBC, IRON, RETICCTPCT in the last 72 hours. Sepsis Labs: No results for input(s): PROCALCITON, LATICACIDVEN in the last 168 hours.  Recent Results (from the past 240 hour(s))  SARS Coronavirus 2 (CEPHEID - Performed in [facility] hospital lab), Hosp Order     Status: None   Collection Time: 06/02/18  2:00 AM  Result Value Ref Range Status   SARS Coronavirus 2 NEGATIVE NEGATIVE Final    Comment: (NOTE) If result is NEGATIVE SARS-CoV-2 target nucleic acids are NOT DETECTED. The SARS-CoV-2 RNA is generally detectable in upper and lower  respiratory specimens during the acute phase of infection. The lowest  concentration of SARS-CoV-2 viral copies this assay can detect is 250  copies / mL. A negative result does not preclude SARS-CoV-2 infection  and should not be used as the sole basis for treatment or other  patient management decisions.  A negative result may occur with  improper specimen collection / handling, submission of specimen other  than nasopharyngeal swab, presence of viral mutation(s) within the  areas targeted by this assay, and inadequate number of viral copies  (<250 copies / mL).  A negative result must be combined with clinical  observations, patient history, and epidemiological information. If result is POSITIVE SARS-CoV-2 target nucleic acids are DETECTED. The SARS-CoV-2 RNA is generally detectable in upper and lower  respiratory specimens dur ing the acute phase of infection.  Positive  results are indicative of active infection with SARS-CoV-2.  Clinical  correlation with patient history and other diagnostic information is  necessary to determine patient infection status.  Positive results do  not rule out bacterial infection or co-infection with other viruses. If result is PRESUMPTIVE POSTIVE SARS-CoV-2 nucleic acids MAY BE PRESENT.  A presumptive positive result was obtained on the submitted specimen  and confirmed on repeat testing.  While 2019 novel coronavirus  (SARS-CoV-2) nucleic acids may be present in the submitted sample  additional confirmatory testing may be necessary for epidemiological  and / or clinical management purposes  to differentiate between  SARS-CoV-2 and other Sarbecovirus currently known to infect humans.  If clinically indicated additional testing with an alternate test  methodology [phone]) is advised. The SARS-CoV-2 RNA is generally  detectable in upper and lower respiratory sp ecimens during the acute  phase of infection. The expected result is Negative. Fact Sheet for Patients:  StrictlyIdeas.no Fact Sheet for Healthcare Providers: BankingDealers.co.za This test is not yet approved or cleared by the [place] FDA and has been authorized for detection and/or diagnosis of SARS-CoV-2 by FDA under an Emergency Use Authorization (EUA).  This EUA will remain in effect (meaning this test can be used) for the duration of the COVID-19 declaration under Section 564(b)(1) of the Act, 21 U.S.C. section 360bbb-3(b)(1), unless the authorization is terminated or revoked  sooner. Performed at [facility], [place] [address], [place], [place] [address]   Surgical pcr screen     Status: None   Collection Time: 06/02/18 10:05 AM  Result Value Ref Range Status   MRSA, PCR NEGATIVE NEGATIVE Final   Staphylococcus aureus NEGATIVE NEGATIVE Final    Comment: (NOTE) The Xpert SA Assay (FDA approved for NASAL specimens in patients 31 years of age and older), is one component of a comprehensive surveillance program. It is not intended to diagnose infection nor to guide or monitor treatment. Performed at [facility], [place] [address] [place], [place] [address]      Radiology Studies: X-ray Chest Pa Or Ap  Result Date: 06/02/2018 CLINICAL DATA:  Central line placement EXAM: CHEST  1 VIEW COMPARISON:  June 02, 2018 FINDINGS: A new left central line terminates in the central SVC. No pneumothorax. Increased lung markings on the left versus the right may be due to positioning with patient rotation. No acute infiltrate. Healed right rib fractures noted. IMPRESSION: The new left central line terminates in the central SVC with no pneumothorax. Electronically Signed   By: Dorise Bullion III M.D   On: 06/02/2018 17:19   Dg Ankle Complete Right  Result Date: 06/02/2018 CLINICAL DATA:  Ex fix placement EXAM: DG C-ARM 61-120 MIN; RIGHT ANKLE - COMPLETE 3+ VIEW COMPARISON:  None. FINDINGS: Comminuted fracture of the lateral malleolus. Small nondisplaced fracture of the medial malleolus. Lateral dislocation of the talar dome relative to the tibial plafond with mild relative improved alignment. Generalized osteopenia. No aggressive osseous lesion. Soft tissue swelling around the ankle. IMPRESSION: Comminuted fracture of the lateral malleolus. Small nondisplaced fracture of the medial malleolus. Lateral dislocation of the talar dome relative to the tibial plafond with mild relative improved alignment. Electronically Signed   By: Kathreen Devoid   On: 06/02/2018 16:12   Ct Ankle  Right Wo Contrast  Result Date: 06/03/2018 CLINICAL DATA:  Right ankle fracture post external fixation. EXAM: CT OF THE RIGHT ANKLE WITHOUT CONTRAST TECHNIQUE: Multidetector CT imaging of the right ankle was performed according to the standard protocol. Multiplanar CT image reconstructions were also generated. COMPARISON:  Radiographs 06/01/2018 and 06/02/2018. FINDINGS: Bones/Joint/Cartilage There are external fixators within the tibial diaphysis and calcaneal tuberosity. Comminuted oblique fracture of the distal fibula demonstrates near anatomic reduction. This fracture extends into the distal tibiofibular joint. The lateral malleolus itself appears  intact. Transverse fracture through the base of the medial malleolus is mildly comminuted with up to 9 mm of residual displacement. This fracture extends into the anterior aspect of the tibial plafond and although involves only a small portion of the weight-bearing articular surface. There is an additional comminuted and mildly displaced intra-articular fracture of the distal tibia posterolaterally, extending into the distal tibiofibular joint. There is no residual talar dislocation, although there is asymmetric tibiotalar joint space widening posteromedially. There are possible small fracture fragments in the joint. There is a small ankle joint effusion. There is subchondral cyst formation anterolaterally in the talar dome which appears nonacute. No acute tarsal bone fractures are identified. There is some motion on the images through the midfoot. There is fragmented spurring along the dorsal aspect of the talar head. Ligaments Suboptimally assessed by CT. Muscles and Tendons Fracture fragments from the medial malleolar fracture surrounding the posterior tibialis tendon which appears intact and normally located. The additional ankle tendons are intact. Soft tissues There is diffuse subcutaneous edema surrounding the ankle with a small amount of subcutaneous  hemorrhage medially. No drainable fluid collection or foreign body identified. IMPRESSION: 1. Near anatomic reduction of the trimalleolar ankle fracture as described. The medial malleolus remains mildly displaced. There is no residual tibiotalar dislocation, although there is mild residual asymmetric joint space widening, likely due to the presence of small intra-articular fracture fragments. 2. No evidence of acute tarsal bone fracture. Subchondral cyst formation anterolaterally in the talar dome. 3. Fracture fragments encroach on the posterior tibialis tendon at the level of the medial malleolus, although the tendon appears intact and normally located. Electronically Signed   By: Richardean Sale M.D.   On: 06/03/2018 08:34   Dg C-arm 1-60 Min  Result Date: 06/02/2018 CLINICAL DATA:  Ex fix placement EXAM: DG C-ARM 61-120 MIN; RIGHT ANKLE - COMPLETE 3+ VIEW COMPARISON:  None. FINDINGS: Comminuted fracture of the lateral malleolus. Small nondisplaced fracture of the medial malleolus. Lateral dislocation of the talar dome relative to the tibial plafond with mild relative improved alignment. Generalized osteopenia. No aggressive osseous lesion. Soft tissue swelling around the ankle. IMPRESSION: Comminuted fracture of the lateral malleolus. Small nondisplaced fracture of the medial malleolus. Lateral dislocation of the talar dome relative to the tibial plafond with mild relative improved alignment. Electronically Signed   By: Kathreen Devoid   On: 06/02/2018 16:12    Scheduled Meds:  [START ON 06/05/2018] bictegravir-emtricitabine-tenofovir AF  1 tablet Oral Daily   enoxaparin (LOVENOX) injection  40 mg Subcutaneous Q24H   fluticasone furoate-vilanterol  1 puff Inhalation Daily   And   umeclidinium bromide  1 puff Inhalation Daily   insulin aspart  0-5 Units Subcutaneous QHS   insulin aspart  0-9 Units Subcutaneous TID WC   insulin detemir  12 Units Subcutaneous QHS   ipratropium-albuterol  3 mL  Nebulization BID   montelukast  10 mg Oral QHS   pregabalin  225 mg Oral BID   rosuvastatin  20 mg Oral Daily   Continuous Infusions:   LOS: 2 days   Marylu Lund, MD [facility] Pager On Amion  If 7PM-7AM, please contact night-coverage 06/04/2018, 2:53 PM

## 2018-06-04 NOTE — Progress Notes (Signed)
Spoke with Dr. Johnnye Sima about patient Drug-Drug Interaction with Trelegy inhaler, Crestor, and Genvoya and recommended switch to [facility] for HIV regimen to reduce drug drug interactions. Dr. Johnnye Sima agreed. Inpatient order has been updated and a new prescription has been sent to [facility]. I verified with the pharmacy that her copay will be $0.00 for the [place].    I will counsel the patient on this medication change.   Plan Stop Rogers, PharmD, [place], [place] [facility] Clinical Pharmacist Phone: [phone] 06/04/2018 4:13 PM

## 2018-06-04 NOTE — Progress Notes (Signed)
Pharmacy is unable to confirm the medications the patient was taking at home. All options have been exhausted and a resolution to the situation is not expected.   Where possible, their outpatient pharmacy(s) have been contacted for the last time prescriptions were filled and that information has been added to each medication in an Order Note (highlighted yellow below the medication).  Please contact pharmacy if further assistance is needed.   Romeo Rabon, PharmD. Mobile: [phone]. 06/04/2018,8:32 AM.

## 2018-06-04 NOTE — Progress Notes (Signed)
Patient off the Bipap on 3 L nasal cannula, tolerating well. Pt continues to be slow to respond but arousable when talking to the pt. Pin site care and dressing change completed. Will continue to monitor any change in condition.

## 2018-06-04 NOTE — Progress Notes (Signed)
Subjective: 2 Days Post-Op Procedure(s) (LRB): EXTERNAL FIXATION LEG (Right) Patient reports pain as mild.  Patient responsive to me this am. C/o mild pain.  Objective: Vital signs in last 24 hours: Temp:  [97.5 F (36.4 C)-98.4 F (36.9 C)] 97.5 F (36.4 C) (06/03 0425) Pulse Rate:  [79-103] 79 (06/03 0800) Resp:  [12-20] 14 (06/03 0800) BP: (121-141)/(62-78) 138/62 (06/03 0348) SpO2:  [91 %-100 %] 99 % (06/03 0804) FiO2 (%):  [40 %] 40 % (06/03 0700)  Intake/Output from previous day: 06/02 0701 - 06/03 0700 In: 780 [P.O.:100; I.V.:680] Out: -  Intake/Output this shift: No intake/output data recorded.  Recent Labs    06/01/18 2240 06/02/18 0115 06/02/18 0446 06/03/18 0628 06/04/18 0436  HGB 13.0 12.9 12.9 10.8* 10.2*   Recent Labs    06/03/18 0628 06/04/18 0436  WBC 12.9* 11.9*  RBC 3.80* 3.54*  HCT 35.4* 33.4*  PLT 186 157   Recent Labs    06/03/18 0628 06/04/18 0436  NA 141 141  K 3.5 3.8  CL 101 101  CO2 28 27  BUN 14 14  CREATININE 1.09* 1.04*  GLUCOSE 208* 163*  CALCIUM 8.7* 8.7*   No results for input(s): LABPT, INR in the last 72 hours.  Compartment soft  RLE- Ex-fix in place. Leg not elevated.  Moderate bloody drainage to bandage. Marked swelling throughout    Assessment/Plan: 2 Days Post-Op Procedure(s) (LRB): EXTERNAL FIXATION LEG (Right)  NWB RLE Continue with pin site care bid.  Please change xeroform bandages over fracture blisters ELEVATE AS MUCH AS POSSIBLE FOR SWELLING Continue to apply ice Hopeful for definitive treatment in about a week as long as swelling decerases       Aundra Dubin 06/04/2018, 8:25 AM

## 2018-06-05 LAB — URINALYSIS, ROUTINE W REFLEX MICROSCOPIC
Bilirubin Urine: NEGATIVE
Glucose, UA: 50 mg/dL — AB
Ketones, ur: 20 mg/dL — AB
Nitrite: POSITIVE — AB
Protein, ur: 100 mg/dL — AB
Specific Gravity, Urine: 1.023 (ref 1.005–1.030)
WBC, UA: >50 WBC/hpf — ABNORMAL HIGH (ref 0–5)
pH: 6 (ref 5.0–8.0)

## 2018-06-05 LAB — RAPID URINE DRUG SCREEN, HOSP PERFORMED
Amphetamines: NOT DETECTED
Barbiturates: NOT DETECTED
Benzodiazepines: POSITIVE — AB
Cocaine: NOT DETECTED
Opiates: POSITIVE — AB
Tetrahydrocannabinol: NOT DETECTED

## 2018-06-05 LAB — GLUCOSE, CAPILLARY
Glucose-Capillary: 182 mg/dL — ABNORMAL HIGH (ref 70–99)
Glucose-Capillary: 164 mg/dL — ABNORMAL HIGH (ref 70–99)
Glucose-Capillary: 157 mg/dL — ABNORMAL HIGH (ref 70–99)
Glucose-Capillary: 181 mg/dL — ABNORMAL HIGH (ref 70–99)

## 2018-06-05 MED ORDER — PROPOFOL 500 MG/50ML IV EMUL
INTRAVENOUS | Status: AC
  Filled 2018-06-05: qty 100

## 2018-06-05 MED ORDER — PHENYLEPHRINE HCL-NACL 10-0.9 MG/250ML-% IV SOLN
INTRAVENOUS | Status: AC
  Filled 2018-06-05: qty 500

## 2018-06-05 MED ORDER — PROPOFOL 1000 MG/100ML IV EMUL
INTRAVENOUS | Status: AC
  Filled 2018-06-05: qty 200

## 2018-06-05 MED ORDER — OXYCODONE HCL 5 MG PO TABS
5.0000 mg | ORAL_TABLET | Freq: Once | ORAL | Status: AC
  Administered 2018-06-05: 5 mg via ORAL
  Filled 2018-06-05: qty 1

## 2018-06-05 NOTE — Progress Notes (Signed)
Subjective: 3 Days Post-Op Procedure(s) (LRB): EXTERNAL FIXATION LEG (Right) Patient reports pain as mild.   Doing ok this am.    Objective: Vital signs in last 24 hours: Temp:  [98.1 F (36.7 C)-98.8 F (37.1 C)] 98.3 F (36.8 C) (06/04 0546) Pulse Rate:  [79-92] 80 (06/04 0725) Resp:  [14-17] 16 (06/04 0725) BP: (106-149)/(61-84) 124/65 (06/04 0441) SpO2:  [96 %-100 %] 98 % (06/04 0725)  Intake/Output from previous day: No intake/output data recorded. Intake/Output this shift: No intake/output data recorded.  Recent Labs    06/03/18 0628 06/04/18 0436  HGB 10.8* 10.2*   Recent Labs    06/03/18 0628 06/04/18 0436  WBC 12.9* 11.9*  RBC 3.80* 3.54*  HCT 35.4* 33.4*  PLT 186 157   Recent Labs    06/03/18 0628 06/04/18 0436  NA 141 141  K 3.5 3.8  CL 101 101  CO2 28 27  BUN 14 14  CREATININE 1.09* 1.04*  GLUCOSE 208* 163*  CALCIUM 8.7* 8.7*   No results for input(s): LABPT, INR in the last 72 hours.  RLE- ex fix in place.  RLE elevated.  Mild drainage to dressing around pin sites   Assessment/Plan: 3 Days Post-Op Procedure(s) (LRB): EXTERNAL FIXATION LEG (Right)  Continue with pin site care bid Continue to elevate for pain and swelling Continue to apply ice Anticipate returning to OR next week for definitive treatment as long as soft tissue swelling has improved      [name] 06/05/2018, 8:10 AM

## 2018-06-05 NOTE — Plan of Care (Signed)
  Problem: Pain Managment: Goal: General experience of comfort will improve Outcome: Progressing   Problem: Education: Goal: Knowledge of General Education information will improve Description: Including pain rating scale, medication(s)/side effects and non-pharmacologic comfort measures Outcome: Not Progressing   Problem: Health Behavior/Discharge Planning: Goal: Ability to manage health-related needs will improve Outcome: Not Progressing   

## 2018-06-05 NOTE — Progress Notes (Signed)
RT assessed pts need for BIPAP V60 . Pt not in need of BIPAP at this time. RT will continue to monitor.

## 2018-06-05 NOTE — Progress Notes (Addendum)
Results for SOMALIA, SEGLER (MRN [id number]) as of 06/05/2018 09:41  Ref. Range 06/04/2018 13:09 06/04/2018 13:12 06/04/2018 16:55 06/04/2018 22:14 06/05/2018 08:18  Glucose-Capillary Latest Ref Range: 70 - 99 mg/dL 232 (H) 231 (H) 240 (H) 241 (H) 182 (H)  Noted that postprandial blood sugars have been greater than 180 mg/dl.   In addition to current insulin orders, recommend adding Novolog 3 units TID with meals if patient eats at least 50 % of meal.  Harvel Ricks RN BSN CDE Diabetes Coordinator Pager: [phone]  8am-5pm

## 2018-06-05 NOTE — Progress Notes (Signed)
Discussed orthopedic treatment plan with [name] today.  At this time, she will remain in the ex fix until the swelling has improved to a point that we can safely perform definitive ORIF.  This will take at least a week from this past Monday.  She needs to be strictly NWB and elevate at all times except eating and toileting.  I told her that under no circumstances should she place any weight on her right foot and ankle.  She has also been made aware that worse case scenario would be that she could require BKA.  For ortho stand point, we will reevaluate her swelling early next week.  Therefore, she may be discharged from hospital to SNF if medically appropriate.  I can see her in the office to check her swelling if she is discharged.  Otherwise I will recheck her swelling in house.  In the meantime, she will need continued BID pin site care.    Azucena Cecil, MD [facility] [phone] 2:55 PM

## 2018-06-05 NOTE — Progress Notes (Signed)
PROGRESS NOTE    [name]  WUJ:[id number] DOB: 08/16/1965 DOA: 06/01/2018 PCP: [name], PA-C    Brief Narrative:  54 y.o.femalewith medical history significant ofsevere COPD on home O2, HVOS, CKD, HIV, DM2 s/p L BKA.  Patient suffered mechanical fall at home. Didn't hit head in fall. Has obvious ankle deformity but apparently was able to walk on ankle without significant pain secondary to her very advanced diabetic neuropathy.  Per EDP notes: Patient states APS did come out and checked her and did not see anything concerning so they left but cousin came later and called EMS. Patient denies any other injuries, head trauma, LOC, etc. She states she has isolated pain in right ankle. She has not tried to get up and move around. She is generally wheelchair bound due to left BKA with prosthesis. Patient states otherwise she has been well.  CBG was low with EMS so they gave PO glucose, additionally CBG was 54 on arrival so she got D50 (would bottom out again at 32 later this morning necessitating further D50 and a D10 gtt).  She was reportedly confused and kept trying to get out of bed and walk despite the ankle fracture in ED. Reduction was performed without sedation and no pain meds were given secondary to her not having much ankle pain due to diabetic neuropathy. Had dopplerable DP post reduction per EDP. CT head neg, CXR neg. On my evaluation however, patient is lethargic and difficult to arouse (see physical exam below).  COVID negative.  Assessment & Plan:   Principal Problem:   Displaced bimalleolar fracture of right ankle, closed, initial encounter Active Problems:   Human immunodeficiency virus (HIV) disease ([place])   Diabetes mellitus type 2 with complications, uncontrolled ([place])   COPD (chronic obstructive pulmonary disease) ([place])   Obesity hypoventilation syndrome ([name])   Acute metabolic encephalopathy   Acute on chronic respiratory failure with  hypoxia and hypercapnia ([name])   CKD (chronic kidney disease), stage III ([name])  Acute metabolic encephalopathy Acute hypoxic hypercapnic respiratory failure OSA versus obesity hypoventilation syndrome Patient with significant COPD on home oxygen at 3 L per nasal cannula.   Following surgical intervention of her right ankle fracture, patient became hypoxic and encephalopathic likely secondary to respiratory failure.   Chest x-ray with no acute cardiopulmonary findings.  COVID-19 test negative.  Patient likely suffers from obesity hypoventilation syndrome in addition to OSA.   Recommend bipap at night. Observed at bedside this AM hypoxic improved when awake  Rightbimalleolar anklefracture with dislocation  Patient with apparent fall at home, is typically wheelchair-bound.  X-ray right ankle notable for acute comminuted distal fibular fracture with lateral angulation and displaced medial malleolar fracture. --[facility] care following, appreciate assistance --Underwent external fixation on 06/03/2018 --Ortho planned staged ORIF next week --Continue nonweightbearing status to right lower extremity --Tramadol/oxycodone PRN --Given timing of dispo for safe discharge, will likely need to remain in hospital. PT consulted  Hypoglycemia IDDM Hemoglobin A1c 8.6 on January 2020.  Follows with endocrine, Dr. Dwyane Dee outpatient.  Home regimen includes Toujeo 50 units twice daily, Humulin  R U-500 45u breakfast, 30u Lunch and 15u prior to snacks; also on Invokana.  Patient with blood glucose of 32 at 0230 on 06/02/2018.  Etiology likley 2/2 prolonged n.p.o. status for ankle fracture with insulin likely contributing to this event.  Patient was started on D10 and switch to D5 half-normal saline. --Currently on Lantus 12u qHS --Novolog sensitive insulin sliding scale for coverage --Cont to monitor  glucose and adjust as needed to achieve euglycemia  CKD stage III Creatinine 1.09.  Within baseline.  --Avoid nephrotoxins, renally dose all medications --Recheck bmet in AM  HIV Follows with infectious disease, Dr. Johnnye Sima outpatient.  Last HIV RNA quant undetectable 05/01/2018. --Continue HAART as tolerated. Pharmacy following  COPD, severe Tobacco abuse disorder Follows with pulmonology, Dr. Halford Chessman outpatient.  Spirometry March 2020 with an FEV1 of 1.1 which is 35% of predicted.  On Trelegy Ellipta at home and oxygen at 3L/min --Continue supplemental oxygen, titrate for SPO2 greater than 88% --No wheezing currently. Cont bronchodilator as needed  HLD: Continue Crestor -Stable at this time  Peripheral neuropathy: Continue Lyrica 225 mg p.o. twice daily -Presently stable  Morbid obesity -Discussed with patient need for aggressive weight loss measures as this complicates all facets of care. -Currently stable at this time  DVT prophylaxis: Lovenox subQ Code Status: DNR Family Communication: Pt in room, family not at bedside Disposition Plan: Uncertain at this time  Consultants:   Orthopedics -[facility], Dr. Louanne Skye, Dr. Erlinda Hong  Procedures:   External fixation right malleoli are ankle fracture on 06/02/2018  Pending staged ORIF  Antimicrobials: Anti-infectives (From admission, onward)   Start     Dose/Rate Route Frequency Ordered Stop   06/05/18 1000  bictegravir-emtricitabine-tenofovir AF (BIKTARVY) 50-200-25 MG per tablet 1 tablet     1 tablet Oral Daily 06/04/18 1216     06/04/18 0000  bictegravir-emtricitabine-tenofovir AF (BIKTARVY) 50-200-25 MG TABS tablet     1 tablet Oral Daily 06/04/18 1217     06/02/18 1500  ceFAZolin (ANCEF) IVPB 2g/100 mL premix    Note to Pharmacy:  Anesthesia to give preop   2 g 200 mL/hr over 30 Minutes Intravenous  Once 06/02/18 1451 06/02/18 1516   06/02/18 1452  ceFAZolin (ANCEF) 2-4 GM/100ML-% IVPB    Note to Pharmacy:  Henrine Screws   : cabinet override      06/02/18 1452 06/02/18 1516   06/02/18 1000   elvitegravir-cobicistat-emtricitabine-tenofovir (GENVOYA) 150-150-200-10 MG tablet 1 tablet  Status:  Discontinued     1 tablet Oral Daily 06/02/18 0256 06/04/18 1216      Subjective: No complaints this AM  Objective: Vitals:   06/05/18 0042 06/05/18 0441 06/05/18 0546 06/05/18 0725  BP: 139/61 124/65    Pulse: 85 79  80  Resp: 14 15  16   Temp:   98.3 F (36.8 C)   TempSrc:   Oral   SpO2: 100% 99%  98%  Weight:      Height:       No intake or output data in the 24 hours ending 06/05/18 1556 Filed Weights   06/01/18 2224  Weight: 106.1 kg    Examination: General exam: Awake, laying in bed, in nad Respiratory system: Normal respiratory effort, no wheezing Cardiovascular system: regular rate, s1, s2 Gastrointestinal system: Soft, nondistended, positive BS, morbidly obese Central nervous system: CN2-12 grossly intact, strength intact Extremities: Perfused, no clubbing Skin: Normal skin turgor, no notable skin lesions seen Psychiatry: Mood normal // no visual hallucinations   Data Reviewed: I have personally reviewed following labs and imaging studies  CBC: Recent Labs  Lab 06/01/18 2240 06/02/18 0115 06/02/18 0446 06/03/18 0628 06/04/18 0436  WBC 17.9*  --   --  12.9* 11.9*  NEUTROABS 14.0*  --   --   --   --   HGB 13.0 12.9 12.9 10.8* 10.2*  HCT 41.6 38.0 38.0 35.4* 33.4*  MCV 92.0  --   --  93.2 94.4  PLT 261  --   --  186 354   Basic Metabolic Panel: Recent Labs  Lab 06/01/18 2240 06/02/18 0115 06/02/18 0446 06/03/18 0628 06/04/18 0436  NA 144 143 143 141 141  K 3.4* 3.7 3.4* 3.5 3.8  CL 109  --   --  101 101  CO2 27  --   --  28 27  GLUCOSE 46*  --   --  208* 163*  BUN 17  --   --  14 14  CREATININE 0.92  --   --  1.09* 1.04*  CALCIUM 8.2*  --   --  8.7* 8.7*  MG  --   --   --   --  1.7   GFR: Estimated Creatinine Clearance: 81.5 mL/min (A) (by C-G formula based on SCr of 1.04 mg/dL (H)). Liver Function Tests: No results for input(s): AST,  ALT, ALKPHOS, BILITOT, PROT, ALBUMIN in the last 168 hours. No results for input(s): LIPASE, AMYLASE in the last 168 hours. Recent Labs  Lab 06/02/18 0940  AMMONIA 23   Coagulation Profile: No results for input(s): INR, PROTIME in the last 168 hours. Cardiac Enzymes: No results for input(s): CKTOTAL, CKMB, CKMBINDEX, TROPONINI in the last 168 hours. BNP (last 3 results) No results for input(s): PROBNP in the last 8760 hours. HbA1C: No results for input(s): HGBA1C in the last 72 hours. CBG: Recent Labs  Lab 06/04/18 1312 06/04/18 1655 06/04/18 2214 06/05/18 0818 06/05/18 1439  GLUCAP 231* 240* 241* 182* 164*   Lipid Profile: No results for input(s): CHOL, HDL, LDLCALC, TRIG, CHOLHDL, LDLDIRECT in the last 72 hours. Thyroid Function Tests: No results for input(s): TSH, T4TOTAL, FREET4, T3FREE, THYROIDAB in the last 72 hours. Anemia Panel: No results for input(s): VITAMINB12, FOLATE, FERRITIN, TIBC, IRON, RETICCTPCT in the last 72 hours. Sepsis Labs: No results for input(s): PROCALCITON, LATICACIDVEN in the last 168 hours.  Recent Results (from the past 240 hour(s))  SARS Coronavirus 2 (CEPHEID - Performed in [place] hospital lab), Hosp Order     Status: None   Collection Time: 06/02/18  2:00 AM  Result Value Ref Range Status   SARS Coronavirus 2 NEGATIVE NEGATIVE Final    Comment: (NOTE) If result is NEGATIVE SARS-CoV-2 target nucleic acids are NOT DETECTED. The SARS-CoV-2 RNA is generally detectable in upper and lower  respiratory specimens during the acute phase of infection. The lowest  concentration of SARS-CoV-2 viral copies this assay can detect is 250  copies / mL. A negative result does not preclude SARS-CoV-2 infection  and should not be used as the sole basis for treatment or other  patient management decisions.  A negative result may occur with  improper specimen collection / handling, submission of specimen other  than nasopharyngeal swab, presence of  viral mutation(s) within the  areas targeted by this assay, and inadequate number of viral copies  (<250 copies / mL). A negative result must be combined with clinical  observations, patient history, and epidemiological information. If result is POSITIVE SARS-CoV-2 target nucleic acids are DETECTED. The SARS-CoV-2 RNA is generally detectable in upper and lower  respiratory specimens dur ing the acute phase of infection.  Positive  results are indicative of active infection with SARS-CoV-2.  Clinical  correlation with patient history and other diagnostic information is  necessary to determine patient infection status.  Positive results do  not rule out bacterial infection or co-infection with other viruses. If result is PRESUMPTIVE POSTIVE SARS-CoV-2 nucleic acids  MAY BE PRESENT.   A presumptive positive result was obtained on the submitted specimen  and confirmed on repeat testing.  While 2019 novel coronavirus  (SARS-CoV-2) nucleic acids may be present in the submitted sample  additional confirmatory testing may be necessary for epidemiological  and / or clinical management purposes  to differentiate between  SARS-CoV-2 and other Sarbecovirus currently known to infect humans.  If clinically indicated additional testing with an alternate test  methodology [phone]) is advised. The SARS-CoV-2 RNA is generally  detectable in upper and lower respiratory sp ecimens during the acute  phase of infection. The expected result is Negative. Fact Sheet for Patients:  StrictlyIdeas.no Fact Sheet for Healthcare Providers: BankingDealers.co.za This test is not yet approved or cleared by the [place] FDA and has been authorized for detection and/or diagnosis of SARS-CoV-2 by FDA under an Emergency Use Authorization (EUA).  This EUA will remain in effect (meaning this test can be used) for the duration of the COVID-19 declaration under Section  564(b)(1) of the Act, 21 U.S.C. section 360bbb-3(b)(1), unless the authorization is terminated or revoked sooner. Performed at [facility], [place] [address] [place], [place] [address]   Surgical pcr screen     Status: None   Collection Time: 06/02/18 10:05 AM  Result Value Ref Range Status   MRSA, PCR NEGATIVE NEGATIVE Final   Staphylococcus aureus NEGATIVE NEGATIVE Final    Comment: (NOTE) The Xpert SA Assay (FDA approved for NASAL specimens in patients 41 years of age and older), is one component of a comprehensive surveillance program. It is not intended to diagnose infection nor to guide or monitor treatment. Performed at [facility], [place] [address] [place],  [address]      Radiology Studies: No results found.  Scheduled Meds: . bictegravir-emtricitabine-tenofovir AF  1 tablet Oral Daily  . enoxaparin (LOVENOX) injection  40 mg Subcutaneous Q24H  . fluticasone furoate-vilanterol  1 puff Inhalation Daily   And  . umeclidinium bromide  1 puff Inhalation Daily  . insulin aspart  0-5 Units Subcutaneous QHS  . insulin aspart  0-9 Units Subcutaneous TID WC  . insulin detemir  12 Units Subcutaneous QHS  . ipratropium-albuterol  3 mL Nebulization BID  . montelukast  10 mg Oral QHS  . pregabalin  225 mg Oral BID  . rosuvastatin  20 mg Oral Daily   Continuous Infusions:   LOS: 3 days   Marylu Lund, MD [facility] Pager On Amion  If 7PM-7AM, please contact night-coverage 06/05/2018, 3:56 PM

## 2018-06-06 LAB — GLUCOSE, CAPILLARY
Glucose-Capillary: 190 mg/dL — ABNORMAL HIGH (ref 70–99)
Glucose-Capillary: 197 mg/dL — ABNORMAL HIGH (ref 70–99)
Glucose-Capillary: 288 mg/dL — ABNORMAL HIGH (ref 70–99)
Glucose-Capillary: 312 mg/dL — ABNORMAL HIGH (ref 70–99)

## 2018-06-06 NOTE — Progress Notes (Signed)
PT Cancellation Note  Patient Details Name: [name] MRN: [id number] DOB: 30-May-1965   Cancelled Treatment:    Reason Eval/Treat Not Completed: PT screened, no needs identified, will sign off Spoke with Dr. Wyline Copas.  PT order placed in anticipation of needs for SNF prior to possible surgery Monday however pt not likely to d/c over the weekend so Dr. Wyline Copas okay with cancelling order at this time.  Please re-order after surgery.   Jina Olenick,[name] 06/06/2018, 9:52 AM Carmelia Bake, PT, DPT Acute Rehabilitation Services Office: [phone] Pager: [phone]

## 2018-06-06 NOTE — Progress Notes (Signed)
PROGRESS NOTE    [name]  EAV:[id number] DOB: Apr 24, 1965 DOA: 06/01/2018 PCP: [name], PA-C    Brief Narrative:  53 y.o.femalewith medical history significant ofsevere COPD on home O2, HVOS, CKD, HIV, DM2 s/p L BKA.  Patient suffered mechanical fall at home. Didn't hit head in fall. Has obvious ankle deformity but apparently was able to walk on ankle without significant pain secondary to her very advanced diabetic neuropathy.  Per EDP notes: Patient states APS did come out and checked her and did not see anything concerning so they left but cousin came later and called EMS. Patient denies any other injuries, head trauma, LOC, etc. She states she has isolated pain in right ankle. She has not tried to get up and move around. She is generally wheelchair bound due to left BKA with prosthesis. Patient states otherwise she has been well.  CBG was low with EMS so they gave PO glucose, additionally CBG was 54 on arrival so she got D50 (would bottom out again at 32 later this morning necessitating further D50 and a D10 gtt).  She was reportedly confused and kept trying to get out of bed and walk despite the ankle fracture in ED. Reduction was performed without sedation and no pain meds were given secondary to her not having much ankle pain due to diabetic neuropathy. Had dopplerable DP post reduction per EDP. CT head neg, CXR neg. On my evaluation however, patient is lethargic and difficult to arouse (see physical exam below).  COVID negative.  Assessment & Plan:   Principal Problem:   Displaced bimalleolar fracture of right ankle, closed, initial encounter Active Problems:   Human immunodeficiency virus (HIV) disease ([place])   Diabetes mellitus type 2 with complications, uncontrolled ([place])   COPD (chronic obstructive pulmonary disease) ([place])   Obesity hypoventilation syndrome ([name])   Acute metabolic encephalopathy   Acute on chronic respiratory failure with  hypoxia and hypercapnia ([name])   CKD (chronic kidney disease), stage III ([name])  Acute metabolic encephalopathy Acute hypoxic hypercapnic respiratory failure OSA versus obesity hypoventilation syndrome Patient with significant COPD on home oxygen at 3 L per nasal cannula.   Following surgical intervention of her right ankle fracture, patient became hypoxic and encephalopathic likely secondary to respiratory failure.   Chest x-ray with no acute cardiopulmonary findings.  COVID-19 test negative.  Patient likely suffers from obesity hypoventilation syndrome in addition to OSA.   Recommend bipap at night. Observed at bedside this AM hypoxic improved when awake  Rightbimalleolar anklefracture with dislocation  Patient with apparent fall at home, is typically wheelchair-bound.  X-ray right ankle notable for acute comminuted distal fibular fracture with lateral angulation and displaced medial malleolar fracture. --[facility] care following, appreciate assistance --Underwent external fixation on 06/03/2018 --Ortho now planned for surgery early next week --Continue nonweightbearing status to right lower extremity --Tramadol/oxycodone PRN  Hypoglycemia IDDM Hemoglobin A1c 8.6 on January 2020.  Follows with endocrine, Dr. Dwyane Dee outpatient.  Home regimen includes Toujeo 50 units twice daily, Humulin  R U-500 45u breakfast, 30u Lunch and 15u prior to snacks; also on Invokana.  Patient with blood glucose of 32 at 0230 on 06/02/2018.  Etiology likley 2/2 prolonged n.p.o. status for ankle fracture with insulin likely contributing to this event.  Patient was started on D10 and switch to D5 half-normal saline. --Currently on Lantus 12u qHS --Novolog sensitive insulin sliding scale for coverage --Cont to monitor glucose and adjust as needed to achieve euglycemia  CKD stage III Creatinine 1.09.  Within baseline. --Avoid nephrotoxins, renally dose all medications --Recheck bmet in AM  HIV  Follows with infectious disease, Dr. Johnnye Sima outpatient.  Last HIV RNA quant undetectable 05/01/2018. --Continue HAART as tolerated. Pharmacy following  COPD, severe Tobacco abuse disorder Follows with pulmonology, Dr. Halford Chessman outpatient.  Spirometry March 2020 with an FEV1 of 1.1 which is 35% of predicted.  On Trelegy Ellipta at home and oxygen at 3L/min --Continue supplemental oxygen, titrate for SPO2 greater than 88% --No wheezing currently. Cont bronchodilator as needed  HLD: Continue Crestor -Stable at this time  Peripheral neuropathy: Continue Lyrica 225 mg p.o. twice daily -Presently stable  Morbid obesity -Discussed with patient need for aggressive weight loss measures as this complicates all facets of care. -Currently stable at this time  DVT prophylaxis: Lovenox subQ Code Status: DNR Family Communication: Pt in room, family not at bedside Disposition Plan: Uncertain at this time  Consultants:   Orthopedics -[facility], Dr. Louanne Skye, Dr. Erlinda Hong  Procedures:   External fixation right malleoli are ankle fracture on 06/02/2018  Pending staged ORIF  Antimicrobials: Anti-infectives (From admission, onward)   Start     Dose/Rate Route Frequency Ordered Stop   06/05/18 1000  bictegravir-emtricitabine-tenofovir AF (BIKTARVY) 50-200-25 MG per tablet 1 tablet     1 tablet Oral Daily 06/04/18 1216     06/04/18 0000  bictegravir-emtricitabine-tenofovir AF (BIKTARVY) 50-200-25 MG TABS tablet     1 tablet Oral Daily 06/04/18 1217     06/02/18 1500  ceFAZolin (ANCEF) IVPB 2g/100 mL premix    Note to Pharmacy:  Anesthesia to give preop   2 g 200 mL/hr over 30 Minutes Intravenous  Once 06/02/18 1451 06/02/18 1516   06/02/18 1452  ceFAZolin (ANCEF) 2-4 GM/100ML-% IVPB    Note to Pharmacy:  Henrine Screws   : cabinet override      06/02/18 1452 06/02/18 1516   06/02/18 1000  elvitegravir-cobicistat-emtricitabine-tenofovir (GENVOYA) 150-150-200-10 MG tablet 1 tablet  Status:   Discontinued     1 tablet Oral Daily 06/02/18 0256 06/04/18 1216      Subjective: Without complaints at this time  Objective: Vitals:   06/06/18 0522 06/06/18 0756 06/06/18 0759 06/06/18 1024  BP:    124/73  Pulse:    81  Resp:    11  Temp: 98.5 F (36.9 C)     TempSrc: Oral     SpO2:  99% 100% 96%  Weight:      Height:       No intake or output data in the 24 hours ending 06/06/18 1323 Filed Weights   06/01/18 2224  Weight: 106.1 kg    Examination: General exam: Asleep, arousable, conversant, in no acute distress Respiratory system: normal chest rise, clear, no audible wheezing  Data Reviewed: I have personally reviewed following labs and imaging studies  CBC: Recent Labs  Lab 06/01/18 2240 06/02/18 0115 06/02/18 0446 06/03/18 0628 06/04/18 0436  WBC 17.9*  --   --  12.9* 11.9*  NEUTROABS 14.0*  --   --   --   --   HGB 13.0 12.9 12.9 10.8* 10.2*  HCT 41.6 38.0 38.0 35.4* 33.4*  MCV 92.0  --   --  93.2 94.4  PLT 261  --   --  186 347   Basic Metabolic Panel: Recent Labs  Lab 06/01/18 2240 06/02/18 0115 06/02/18 0446 06/03/18 0628 06/04/18 0436  NA 144 143 143 141 141  K 3.4* 3.7 3.4* 3.5 3.8  CL 109  --   --  101 101  CO2 27  --   --  28 27  GLUCOSE 46*  --   --  208* 163*  BUN 17  --   --  14 14  CREATININE 0.92  --   --  1.09* 1.04*  CALCIUM 8.2*  --   --  8.7* 8.7*  MG  --   --   --   --  1.7   GFR: Estimated Creatinine Clearance: 81.5 mL/min (A) (by C-G formula based on SCr of 1.04 mg/dL (H)). Liver Function Tests: No results for input(s): AST, ALT, ALKPHOS, BILITOT, PROT, ALBUMIN in the last 168 hours. No results for input(s): LIPASE, AMYLASE in the last 168 hours. Recent Labs  Lab 06/02/18 0940  AMMONIA 23   Coagulation Profile: No results for input(s): INR, PROTIME in the last 168 hours. Cardiac Enzymes: No results for input(s): CKTOTAL, CKMB, CKMBINDEX, TROPONINI in the last 168 hours. BNP (last 3 results) No results for  input(s): PROBNP in the last 8760 hours. HbA1C: No results for input(s): HGBA1C in the last 72 hours. CBG: Recent Labs  Lab 06/05/18 1439 06/05/18 1639 06/05/18 2103 06/06/18 0832 06/06/18 1202  GLUCAP 164* 157* 181* 190* 197*   Lipid Profile: No results for input(s): CHOL, HDL, LDLCALC, TRIG, CHOLHDL, LDLDIRECT in the last 72 hours. Thyroid Function Tests: No results for input(s): TSH, T4TOTAL, FREET4, T3FREE, THYROIDAB in the last 72 hours. Anemia Panel: No results for input(s): VITAMINB12, FOLATE, FERRITIN, TIBC, IRON, RETICCTPCT in the last 72 hours. Sepsis Labs: No results for input(s): PROCALCITON, LATICACIDVEN in the last 168 hours.  Recent Results (from the past 240 hour(s))  SARS Coronavirus 2 (CEPHEID - Performed in [place] hospital lab), Hosp Order     Status: None   Collection Time: 06/02/18  2:00 AM  Result Value Ref Range Status   SARS Coronavirus 2 NEGATIVE NEGATIVE Final    Comment: (NOTE) If result is NEGATIVE SARS-CoV-2 target nucleic acids are NOT DETECTED. The SARS-CoV-2 RNA is generally detectable in upper and lower  respiratory specimens during the acute phase of infection. The lowest  concentration of SARS-CoV-2 viral copies this assay can detect is 250  copies / mL. A negative result does not preclude SARS-CoV-2 infection  and should not be used as the sole basis for treatment or other  patient management decisions.  A negative result may occur with  improper specimen collection / handling, submission of specimen other  than nasopharyngeal swab, presence of viral mutation(s) within the  areas targeted by this assay, and inadequate number of viral copies  (<250 copies / mL). A negative result must be combined with clinical  observations, patient history, and epidemiological information. If result is POSITIVE SARS-CoV-2 target nucleic acids are DETECTED. The SARS-CoV-2 RNA is generally detectable in upper and lower  respiratory specimens dur ing  the acute phase of infection.  Positive  results are indicative of active infection with SARS-CoV-2.  Clinical  correlation with patient history and other diagnostic information is  necessary to determine patient infection status.  Positive results do  not rule out bacterial infection or co-infection with other viruses. If result is PRESUMPTIVE POSTIVE SARS-CoV-2 nucleic acids MAY BE PRESENT.   A presumptive positive result was obtained on the submitted specimen  and confirmed on repeat testing.  While 2019 novel coronavirus  (SARS-CoV-2) nucleic acids may be present in the submitted sample  additional confirmatory testing may be necessary for epidemiological  and / or clinical management purposes  to  differentiate between  SARS-CoV-2 and other Sarbecovirus currently known to infect humans.  If clinically indicated additional testing with an alternate test  methodology [phone]) is advised. The SARS-CoV-2 RNA is generally  detectable in upper and lower respiratory sp ecimens during the acute  phase of infection. The expected result is Negative. Fact Sheet for Patients:  StrictlyIdeas.no Fact Sheet for Healthcare Providers: BankingDealers.co.za This test is not yet approved or cleared by the [place] FDA and has been authorized for detection and/or diagnosis of SARS-CoV-2 by FDA under an Emergency Use Authorization (EUA).  This EUA will remain in effect (meaning this test can be used) for the duration of the COVID-19 declaration under Section 564(b)(1) of the Act, 21 U.S.C. section 360bbb-3(b)(1), unless the authorization is terminated or revoked sooner. Performed at [facility], [place] [address], [place], [place] [address]   Surgical pcr screen     Status: None   Collection Time: 06/02/18 10:05 AM  Result Value Ref Range Status   MRSA, PCR NEGATIVE NEGATIVE Final   Staphylococcus aureus NEGATIVE NEGATIVE Final    Comment:  (NOTE) The Xpert SA Assay (FDA approved for NASAL specimens in patients 31 years of age and older), is one component of a comprehensive surveillance program. It is not intended to diagnose infection nor to guide or monitor treatment. Performed at [facility], [place] [address] [place], [place] [address]      Radiology Studies: No results found.  Scheduled Meds: . bictegravir-emtricitabine-tenofovir AF  1 tablet Oral Daily  . enoxaparin (LOVENOX) injection  40 mg Subcutaneous Q24H  . fluticasone furoate-vilanterol  1 puff Inhalation Daily   And  . umeclidinium bromide  1 puff Inhalation Daily  . insulin aspart  0-5 Units Subcutaneous QHS  . insulin aspart  0-9 Units Subcutaneous TID WC  . insulin detemir  12 Units Subcutaneous QHS  . ipratropium-albuterol  3 mL Nebulization BID  . montelukast  10 mg Oral QHS  . pregabalin  225 mg Oral BID  . rosuvastatin  20 mg Oral Daily   Continuous Infusions:   LOS: 4 days   Marylu Lund, MD [facility] Pager On Amion  If 7PM-7AM, please contact night-coverage 06/06/2018, 1:23 PM

## 2018-06-06 NOTE — Progress Notes (Signed)
Pt does not wish to wear BIPAP tonight for sleep.  Machine removed from room .  RT will continue to monitor.

## 2018-06-06 NOTE — Care Management Important Message (Signed)
Important Message  Patient Details  Name: [name] MRN: [id number] Date of Birth: 1965/09/27   Medicare Important Message Given:  Yes    Memory Argue 06/06/2018, 3:10 PM

## 2018-06-07 LAB — GLUCOSE, CAPILLARY
Glucose-Capillary: 281 mg/dL — ABNORMAL HIGH (ref 70–99)
Glucose-Capillary: 282 mg/dL — ABNORMAL HIGH (ref 70–99)
Glucose-Capillary: 236 mg/dL — ABNORMAL HIGH (ref 70–99)
Glucose-Capillary: 339 mg/dL — ABNORMAL HIGH (ref 70–99)

## 2018-06-07 MED ORDER — FLUTICASONE PROPIONATE 50 MCG/ACT NA SUSP
1.0000 | Freq: Every day | NASAL | Status: DC
  Administered 2018-06-08 – 2018-06-23 (×16): 1 via NASAL
  Filled 2018-06-07: qty 16

## 2018-06-07 MED ORDER — MORPHINE SULFATE (PF) 2 MG/ML IV SOLN
2.0000 mg | Freq: Once | INTRAVENOUS | Status: AC
  Administered 2018-06-07: 2 mg via INTRAVENOUS
  Filled 2018-06-07: qty 1

## 2018-06-07 MED ORDER — SERTRALINE HCL 100 MG PO TABS
200.0000 mg | ORAL_TABLET | Freq: Every day | ORAL | Status: DC
  Administered 2018-06-07 – 2018-06-23 (×17): 200 mg via ORAL
  Filled 2018-06-07 (×18): qty 2

## 2018-06-07 MED ORDER — SERTRALINE HCL 50 MG PO TABS: 50.0000 mg | ORAL_TABLET | Freq: Every day | ORAL | Status: DC

## 2018-06-07 MED ORDER — MORPHINE SULFATE (PF) 2 MG/ML IV SOLN
1.0000 mg | Freq: Once | INTRAVENOUS | Status: AC
  Administered 2018-06-07: 1 mg via INTRAVENOUS
  Filled 2018-06-07: qty 1

## 2018-06-07 NOTE — Progress Notes (Signed)
Patient refused use of BIPAP for the evening. Will continue to monitor pt.

## 2018-06-07 NOTE — Progress Notes (Signed)
PROGRESS NOTE    [name]  OIZ:[id number] DOB: 1965-07-05 DOA: 06/01/2018 PCP: [name], PA-C    Brief Narrative:  53 y.o.femalewith medical history significant ofsevere COPD on home O2, HVOS, CKD, HIV, DM2 s/p L BKA.  Patient suffered mechanical fall at home. Didn't hit head in fall. Has obvious ankle deformity but apparently was able to walk on ankle without significant pain secondary to her very advanced diabetic neuropathy.  Per EDP notes: Patient states APS did come out and checked her and did not see anything concerning so they left but cousin came later and called EMS. Patient denies any other injuries, head trauma, LOC, etc. She states she has isolated pain in right ankle. She has not tried to get up and move around. She is generally wheelchair bound due to left BKA with prosthesis. Patient states otherwise she has been well.  CBG was low with EMS so they gave PO glucose, additionally CBG was 54 on arrival so she got D50 (would bottom out again at 32 later this morning necessitating further D50 and a D10 gtt).  She was reportedly confused and kept trying to get out of bed and walk despite the ankle fracture in ED. Reduction was performed without sedation and no pain meds were given secondary to her not having much ankle pain due to diabetic neuropathy. Had dopplerable DP post reduction per EDP. CT head neg, CXR neg. On my evaluation however, patient is lethargic and difficult to arouse (see physical exam below).  COVID negative.  Assessment & Plan:   Principal Problem:   Displaced bimalleolar fracture of right ankle, closed, initial encounter Active Problems:   Human immunodeficiency virus (HIV) disease ([place])   Diabetes mellitus type 2 with complications, uncontrolled ([place])   COPD (chronic obstructive pulmonary disease) ([place])   Obesity hypoventilation syndrome ([name])   Acute metabolic encephalopathy   Acute on chronic respiratory failure with  hypoxia and hypercapnia ([name])   CKD (chronic kidney disease), stage III ([name])  Acute metabolic encephalopathy Acute hypoxic hypercapnic respiratory failure OSA versus obesity hypoventilation syndrome Patient with significant COPD on home oxygen at 3 L per nasal cannula.   Following surgical intervention of her right ankle fracture, patient became hypoxic and encephalopathic likely secondary to respiratory failure.   Chest x-ray with no acute cardiopulmonary findings.  COVID-19 test negative.  Patient likely suffers from obesity hypoventilation syndrome in addition to OSA.   Would likely benefit from bipap/cpap at night  Rightbimalleolar anklefracture with dislocation  Patient with apparent fall at home, is typically wheelchair-bound.  X-ray right ankle notable for acute comminuted distal fibular fracture with lateral angulation and displaced medial malleolar fracture. --[facility] care following, appreciate assistance --Underwent external fixation on 06/03/2018 --Ortho now planned for surgery early this week --Continue nonweightbearing status to right lower extremity --Tramadol/oxycodone PRN  Hypoglycemia IDDM Hemoglobin A1c 8.6 on January 2020.  Follows with endocrine, Dr. Dwyane Dee outpatient.  Home regimen includes Toujeo 50 units twice daily, Humulin  R U-500 45u breakfast, 30u Lunch and 15u prior to snacks; also on Invokana.  Patient with blood glucose of 32 at 0230 on 06/02/2018.  Etiology likley 2/2 prolonged n.p.o. status for ankle fracture with insulin likely contributing to this event.  Patient was started on D10 and switch to D5 half-normal saline. --Currently on Lantus 12u qHS --Novolog sensitive insulin sliding scale for coverage --Cont to monitor glucose and adjust as needed to achieve euglycemia  CKD stage III Creatinine 1.09.  Within baseline. --Avoid nephrotoxins, renally  dose all medications --Recheck bmet in AM  HIV Follows with infectious disease, Dr.  Johnnye Sima outpatient.  Last HIV RNA quant undetectable 05/01/2018. --Continue HAART as tolerated. Pharmacy following  COPD, severe Tobacco abuse disorder Follows with pulmonology, Dr. Halford Chessman outpatient.  Spirometry March 2020 with an FEV1 of 1.1 which is 35% of predicted.  On Trelegy Ellipta at home and oxygen at 3L/min --Continue supplemental oxygen, titrate for SPO2 greater than 88% --No wheezing currently. Cont bronchodilator as needed  HLD: Continue Crestor -Stable at this time  Peripheral neuropathy: Continue Lyrica 225 mg p.o. twice daily -Presently stable  Morbid obesity -Discussed with patient need for aggressive weight loss measures as this complicates all facets of care. -Currently stable at this time  DVT prophylaxis: Lovenox subQ Code Status: DNR Family Communication: Pt in room, family not at bedside Disposition Plan: Uncertain at this time  Consultants:   Orthopedics -[facility], Dr. Louanne Skye, Dr. Erlinda Hong  Procedures:   External fixation right malleoli are ankle fracture on 06/02/2018  Pending staged ORIF  Antimicrobials: Anti-infectives (From admission, onward)   Start     Dose/Rate Route Frequency Ordered Stop   06/05/18 1000  bictegravir-emtricitabine-tenofovir AF (BIKTARVY) 50-200-25 MG per tablet 1 tablet     1 tablet Oral Daily 06/04/18 1216     06/04/18 0000  bictegravir-emtricitabine-tenofovir AF (BIKTARVY) 50-200-25 MG TABS tablet     1 tablet Oral Daily 06/04/18 1217     06/02/18 1500  ceFAZolin (ANCEF) IVPB 2g/100 mL premix    Note to Pharmacy:  Anesthesia to give preop   2 g 200 mL/hr over 30 Minutes Intravenous  Once 06/02/18 1451 06/02/18 1516   06/02/18 1452  ceFAZolin (ANCEF) 2-4 GM/100ML-% IVPB    Note to Pharmacy:  Henrine Screws   : cabinet override      06/02/18 1452 06/02/18 1516   06/02/18 1000  elvitegravir-cobicistat-emtricitabine-tenofovir (GENVOYA) 150-150-200-10 MG tablet 1 tablet  Status:  Discontinued     1 tablet Oral Daily  06/02/18 0256 06/04/18 1216      Subjective: No complaints at this time  Objective: Vitals:   06/07/18 0819 06/07/18 0840 06/07/18 0841 06/07/18 1218  BP: 93/74     Pulse: 79     Resp: 16     Temp: 98.7 F (37.1 C)   98.3 F (36.8 C)  TempSrc: Oral   Oral  SpO2: 90% 97% 99%   Weight:      Height:        Intake/Output Summary (Last 24 hours) at 06/07/2018 1516 Last data filed at 06/07/2018 1231 Gross per 24 hour  Intake 270 ml  Output -  Net 270 ml   Filed Weights   06/01/18 2224  Weight: 106.1 kg    Examination: General exam: Awake, laying in bed, in nad Respiratory system: Normal respiratory effort, no wheezing  Data Reviewed: I have personally reviewed following labs and imaging studies  CBC: Recent Labs  Lab 06/01/18 2240 06/02/18 0115 06/02/18 0446 06/03/18 0628 06/04/18 0436  WBC 17.9*  --   --  12.9* 11.9*  NEUTROABS 14.0*  --   --   --   --   HGB 13.0 12.9 12.9 10.8* 10.2*  HCT 41.6 38.0 38.0 35.4* 33.4*  MCV 92.0  --   --  93.2 94.4  PLT 261  --   --  186 250   Basic Metabolic Panel: Recent Labs  Lab 06/01/18 2240 06/02/18 0115 06/02/18 0446 06/03/18 0628 06/04/18 0436  NA 144 143 143  141 141  K 3.4* 3.7 3.4* 3.5 3.8  CL 109  --   --  101 101  CO2 27  --   --  28 27  GLUCOSE 46*  --   --  208* 163*  BUN 17  --   --  14 14  CREATININE 0.92  --   --  1.09* 1.04*  CALCIUM 8.2*  --   --  8.7* 8.7*  MG  --   --   --   --  1.7   GFR: Estimated Creatinine Clearance: 81.5 mL/min (A) (by C-G formula based on SCr of 1.04 mg/dL (H)). Liver Function Tests: No results for input(s): AST, ALT, ALKPHOS, BILITOT, PROT, ALBUMIN in the last 168 hours. No results for input(s): LIPASE, AMYLASE in the last 168 hours. Recent Labs  Lab 06/02/18 0940  AMMONIA 23   Coagulation Profile: No results for input(s): INR, PROTIME in the last 168 hours. Cardiac Enzymes: No results for input(s): CKTOTAL, CKMB, CKMBINDEX, TROPONINI in the last 168 hours. BNP  (last 3 results) No results for input(s): PROBNP in the last 8760 hours. HbA1C: No results for input(s): HGBA1C in the last 72 hours. CBG: Recent Labs  Lab 06/06/18 1202 06/06/18 1642 06/06/18 2331 06/07/18 0816 06/07/18 1224  GLUCAP 197* 288* 312* 281* 282*   Lipid Profile: No results for input(s): CHOL, HDL, LDLCALC, TRIG, CHOLHDL, LDLDIRECT in the last 72 hours. Thyroid Function Tests: No results for input(s): TSH, T4TOTAL, FREET4, T3FREE, THYROIDAB in the last 72 hours. Anemia Panel: No results for input(s): VITAMINB12, FOLATE, FERRITIN, TIBC, IRON, RETICCTPCT in the last 72 hours. Sepsis Labs: No results for input(s): PROCALCITON, LATICACIDVEN in the last 168 hours.  Recent Results (from the past 240 hour(s))  SARS Coronavirus 2 (CEPHEID - Performed in [facility] hospital lab), Hosp Order     Status: None   Collection Time: 06/02/18  2:00 AM  Result Value Ref Range Status   SARS Coronavirus 2 NEGATIVE NEGATIVE Final    Comment: (NOTE) If result is NEGATIVE SARS-CoV-2 target nucleic acids are NOT DETECTED. The SARS-CoV-2 RNA is generally detectable in upper and lower  respiratory specimens during the acute phase of infection. The lowest  concentration of SARS-CoV-2 viral copies this assay can detect is 250  copies / mL. A negative result does not preclude SARS-CoV-2 infection  and should not be used as the sole basis for treatment or other  patient management decisions.  A negative result may occur with  improper specimen collection / handling, submission of specimen other  than nasopharyngeal swab, presence of viral mutation(s) within the  areas targeted by this assay, and inadequate number of viral copies  (<250 copies / mL). A negative result must be combined with clinical  observations, patient history, and epidemiological information. If result is POSITIVE SARS-CoV-2 target nucleic acids are DETECTED. The SARS-CoV-2 RNA is generally detectable in upper and lower   respiratory specimens dur ing the acute phase of infection.  Positive  results are indicative of active infection with SARS-CoV-2.  Clinical  correlation with patient history and other diagnostic information is  necessary to determine patient infection status.  Positive results do  not rule out bacterial infection or co-infection with other viruses. If result is PRESUMPTIVE POSTIVE SARS-CoV-2 nucleic acids MAY BE PRESENT.   A presumptive positive result was obtained on the submitted specimen  and confirmed on repeat testing.  While 2019 novel coronavirus  (SARS-CoV-2) nucleic acids may be present in the submitted sample  additional confirmatory testing may be necessary for epidemiological  and / or clinical management purposes  to differentiate between  SARS-CoV-2 and other Sarbecovirus currently known to infect humans.  If clinically indicated additional testing with an alternate test  methodology [phone]) is advised. The SARS-CoV-2 RNA is generally  detectable in upper and lower respiratory sp ecimens during the acute  phase of infection. The expected result is Negative. Fact Sheet for Patients:  StrictlyIdeas.no Fact Sheet for Healthcare Providers: BankingDealers.co.za This test is not yet approved or cleared by the [place] FDA and has been authorized for detection and/or diagnosis of SARS-CoV-2 by FDA under an Emergency Use Authorization (EUA).  This EUA will remain in effect (meaning this test can be used) for the duration of the COVID-19 declaration under Section 564(b)(1) of the Act, 21 U.S.C. section 360bbb-3(b)(1), unless the authorization is terminated or revoked sooner. Performed at [facility], [place] [address] [place], [place] [address]   Surgical pcr screen     Status: None   Collection Time: 06/02/18 10:05 AM  Result Value Ref Range Status   MRSA, PCR NEGATIVE NEGATIVE Final   Staphylococcus aureus  NEGATIVE NEGATIVE Final    Comment: (NOTE) The Xpert SA Assay (FDA approved for NASAL specimens in patients 71 years of age and older), is one component of a comprehensive surveillance program. It is not intended to diagnose infection nor to guide or monitor treatment. Performed at [facility], [place] [address] [place], [place] [address]      Radiology Studies: No results found.  Scheduled Meds: . bictegravir-emtricitabine-tenofovir AF  1 tablet Oral Daily  . enoxaparin (LOVENOX) injection  40 mg Subcutaneous Q24H  . fluticasone  1 spray Each Nare Daily  . fluticasone furoate-vilanterol  1 puff Inhalation Daily   And  . umeclidinium bromide  1 puff Inhalation Daily  . insulin aspart  0-5 Units Subcutaneous QHS  . insulin aspart  0-9 Units Subcutaneous TID WC  . insulin detemir  12 Units Subcutaneous QHS  . ipratropium-albuterol  3 mL Nebulization BID  . montelukast  10 mg Oral QHS  . pregabalin  225 mg Oral BID  . rosuvastatin  20 mg Oral Daily  . sertraline  150 mg Oral Daily  . sertraline  50 mg Oral Daily   Continuous Infusions:   LOS: 5 days   Marylu Lund, MD [facility] Pager On Amion  If 7PM-7AM, please contact night-coverage 06/07/2018, 3:16 PM

## 2018-06-07 NOTE — Progress Notes (Signed)
Dressing on RLW was changed per MD order. Xeroform was applied to all fracture blisters. LLE elevated, ice applied to affected area.

## 2018-06-08 LAB — GLUCOSE, CAPILLARY
Glucose-Capillary: 273 mg/dL — ABNORMAL HIGH (ref 70–99)
Glucose-Capillary: 246 mg/dL — ABNORMAL HIGH (ref 70–99)
Glucose-Capillary: 205 mg/dL — ABNORMAL HIGH (ref 70–99)
Glucose-Capillary: 287 mg/dL — ABNORMAL HIGH (ref 70–99)

## 2018-06-08 NOTE — Progress Notes (Signed)
Patient refused use of BIPAP for the evening.will continue to monitor patient.

## 2018-06-08 NOTE — Progress Notes (Signed)
PROGRESS NOTE    [name]  JSH:[id number] DOB: 01-16-1965 DOA: 06/01/2018 PCP: [name], PA-C    Brief Narrative:  54 y.o.femalewith medical history significant ofsevere COPD on home O2, HVOS, CKD, HIV, DM2 s/p L BKA.  Patient suffered mechanical fall at home. Didn't hit head in fall. Has obvious ankle deformity but apparently was able to walk on ankle without significant pain secondary to her very advanced diabetic neuropathy.  Per EDP notes: Patient states APS did come out and checked her and did not see anything concerning so they left but cousin came later and called EMS. Patient denies any other injuries, head trauma, LOC, etc. She states she has isolated pain in right ankle. She has not tried to get up and move around. She is generally wheelchair bound due to left BKA with prosthesis. Patient states otherwise she has been well.  CBG was low with EMS so they gave PO glucose, additionally CBG was 54 on arrival so she got D50 (would bottom out again at 32 later this morning necessitating further D50 and a D10 gtt).  She was reportedly confused and kept trying to get out of bed and walk despite the ankle fracture in ED. Reduction was performed without sedation and no pain meds were given secondary to her not having much ankle pain due to diabetic neuropathy. Had dopplerable DP post reduction per EDP. CT head neg, CXR neg. On my evaluation however, patient is lethargic and difficult to arouse (see physical exam below).  COVID negative.  Assessment & Plan:   Principal Problem:   Displaced bimalleolar fracture of right ankle, closed, initial encounter Active Problems:   Human immunodeficiency virus (HIV) disease ([place])   Diabetes mellitus type 2 with complications, uncontrolled ([place])   COPD (chronic obstructive pulmonary disease) ([place])   Obesity hypoventilation syndrome ([name])   Acute metabolic encephalopathy   Acute on chronic respiratory failure with  hypoxia and hypercapnia ([name])   CKD (chronic kidney disease), stage III ([name])  Acute metabolic encephalopathy Acute hypoxic hypercapnic respiratory failure OSA versus obesity hypoventilation syndrome Patient with significant COPD on home oxygen at 3 L per nasal cannula.   Following surgical intervention of her right ankle fracture, patient became hypoxic and encephalopathic likely secondary to respiratory failure.   Chest x-ray with no acute cardiopulmonary findings.  COVID-19 test negative.  Patient likely suffers from obesity hypoventilation syndrome in addition to OSA.   Would likely benefit from bipap/cpap at night, patient is refusing  Rightbimalleolar anklefracture with dislocation  Patient with apparent fall at home, is typically wheelchair-bound.  X-ray right ankle notable for acute comminuted distal fibular fracture with lateral angulation and displaced medial malleolar fracture. --[facility] care following, appreciate assistance --Underwent external fixation on 06/03/2018 --Ortho now planned for surgery early this week --Continue nonweightbearing status to right lower extremity --Tramadol/oxycodone PRN  Hypoglycemia IDDM Hemoglobin A1c 8.6 on January 2020.  Follows with endocrine, Dr. Dwyane Dee outpatient.  Home regimen includes Toujeo 50 units twice daily, Humulin  R U-500 45u breakfast, 30u Lunch and 15u prior to snacks; also on Invokana.  Patient with blood glucose of 32 at 0230 on 06/02/2018.  Etiology likley 2/2 prolonged n.p.o. status for ankle fracture with insulin likely contributing to this event.  Patient was started on D10 and switch to D5 half-normal saline. --Currently on Lantus 12u qHS --Novolog sensitive insulin sliding scale for coverage --Cont to monitor glucose and adjust as needed to achieve euglycemia  CKD stage III Creatinine 1.09.  Within baseline. --  Avoid nephrotoxins, renally dose all medications --Recheck bmet in AM  HIV Follows with  infectious disease, Dr. Johnnye Sima outpatient.  Last HIV RNA quant undetectable 05/01/2018. --Continue HAART as tolerated. Pharmacy cont to follow following  COPD, severe Tobacco abuse disorder Follows with pulmonology, Dr. Halford Chessman outpatient.  Spirometry March 2020 with an FEV1 of 1.1 which is 35% of predicted.  On Trelegy Ellipta at home and oxygen at 3L/min --Continue supplemental oxygen, titrate for SPO2 greater than 88% --No wheezing at this time. Cont bronchodilator as needed  HLD: Continue Crestor -Stable at this time  Peripheral neuropathy: Continue Lyrica 225 mg p.o. twice daily -Presently stable  Morbid obesity -Discussed with patient need for aggressive weight loss measures as this complicates all facets of care. -Presently stable  DVT prophylaxis: Lovenox subQ Code Status: DNR Family Communication: Pt in room, family not at bedside Disposition Plan: Uncertain at this time  Consultants:   Orthopedics -[facility], Dr. Louanne Skye, Dr. Erlinda Hong  Procedures:   External fixation right malleoli are ankle fracture on 06/02/2018  Pending staged ORIF  Antimicrobials: Anti-infectives (From admission, onward)   Start     Dose/Rate Route Frequency Ordered Stop   06/05/18 1000  bictegravir-emtricitabine-tenofovir AF (BIKTARVY) 50-200-25 MG per tablet 1 tablet     1 tablet Oral Daily 06/04/18 1216     06/04/18 0000  bictegravir-emtricitabine-tenofovir AF (BIKTARVY) 50-200-25 MG TABS tablet     1 tablet Oral Daily 06/04/18 1217     06/02/18 1500  ceFAZolin (ANCEF) IVPB 2g/100 mL premix    Note to Pharmacy:  Anesthesia to give preop   2 g 200 mL/hr over 30 Minutes Intravenous  Once 06/02/18 1451 06/02/18 1516   06/02/18 1452  ceFAZolin (ANCEF) 2-4 GM/100ML-% IVPB    Note to Pharmacy:  Henrine Screws   : cabinet override      06/02/18 1452 06/02/18 1516   06/02/18 1000  elvitegravir-cobicistat-emtricitabine-tenofovir (GENVOYA) 150-150-200-10 MG tablet 1 tablet  Status:   Discontinued     1 tablet Oral Daily 06/02/18 0256 06/04/18 1216      Subjective: Without complaints at this time  Objective: Vitals:   06/07/18 1740 06/07/18 1927 06/07/18 2047 06/08/18 0751  BP:   (!) 143/70   Pulse: 79 86 84   Resp: 13  17   Temp:   98 F (36.7 C)   TempSrc:   Oral   SpO2: 91% 100% 94% 90%  Weight:      Height:        Intake/Output Summary (Last 24 hours) at 06/08/2018 1537 Last data filed at 06/08/2018 0734 Gross per 24 hour  Intake 120 ml  Output 975 ml  Net -855 ml   Filed Weights   06/01/18 2224  Weight: 106.1 kg    Examination: General exam: Asleep, arousable, laying in bed, in nad Respiratory system: Normal respiratory effort, no wheezing, snoring  Data Reviewed: I have personally reviewed following labs and imaging studies  CBC: Recent Labs  Lab 06/01/18 2240 06/02/18 0115 06/02/18 0446 06/03/18 0628 06/04/18 0436  WBC 17.9*  --   --  12.9* 11.9*  NEUTROABS 14.0*  --   --   --   --   HGB 13.0 12.9 12.9 10.8* 10.2*  HCT 41.6 38.0 38.0 35.4* 33.4*  MCV 92.0  --   --  93.2 94.4  PLT 261  --   --  186 409   Basic Metabolic Panel: Recent Labs  Lab 06/01/18 2240 06/02/18 0115 06/02/18 0446 06/03/18 0628 06/04/18  0436  NA 144 143 143 141 141  K 3.4* 3.7 3.4* 3.5 3.8  CL 109  --   --  101 101  CO2 27  --   --  28 27  GLUCOSE 46*  --   --  208* 163*  BUN 17  --   --  14 14  CREATININE 0.92  --   --  1.09* 1.04*  CALCIUM 8.2*  --   --  8.7* 8.7*  MG  --   --   --   --  1.7   GFR: Estimated Creatinine Clearance: 81.5 mL/min (A) (by C-G formula based on SCr of 1.04 mg/dL (H)). Liver Function Tests: No results for input(s): AST, ALT, ALKPHOS, BILITOT, PROT, ALBUMIN in the last 168 hours. No results for input(s): LIPASE, AMYLASE in the last 168 hours. Recent Labs  Lab 06/02/18 0940  AMMONIA 23   Coagulation Profile: No results for input(s): INR, PROTIME in the last 168 hours. Cardiac Enzymes: No results for input(s):  CKTOTAL, CKMB, CKMBINDEX, TROPONINI in the last 168 hours. BNP (last 3 results) No results for input(s): PROBNP in the last 8760 hours. HbA1C: No results for input(s): HGBA1C in the last 72 hours. CBG: Recent Labs  Lab 06/07/18 1224 06/07/18 1708 06/07/18 2141 06/08/18 0811 06/08/18 1152  GLUCAP 282* 236* 339* 273* 246*   Lipid Profile: No results for input(s): CHOL, HDL, LDLCALC, TRIG, CHOLHDL, LDLDIRECT in the last 72 hours. Thyroid Function Tests: No results for input(s): TSH, T4TOTAL, FREET4, T3FREE, THYROIDAB in the last 72 hours. Anemia Panel: No results for input(s): VITAMINB12, FOLATE, FERRITIN, TIBC, IRON, RETICCTPCT in the last 72 hours. Sepsis Labs: No results for input(s): PROCALCITON, LATICACIDVEN in the last 168 hours.  Recent Results (from the past 240 hour(s))  SARS Coronavirus 2 (CEPHEID - Performed in [place] hospital lab), Hosp Order     Status: None   Collection Time: 06/02/18  2:00 AM  Result Value Ref Range Status   SARS Coronavirus 2 NEGATIVE NEGATIVE Final    Comment: (NOTE) If result is NEGATIVE SARS-CoV-2 target nucleic acids are NOT DETECTED. The SARS-CoV-2 RNA is generally detectable in upper and lower  respiratory specimens during the acute phase of infection. The lowest  concentration of SARS-CoV-2 viral copies this assay can detect is 250  copies / mL. A negative result does not preclude SARS-CoV-2 infection  and should not be used as the sole basis for treatment or other  patient management decisions.  A negative result may occur with  improper specimen collection / handling, submission of specimen other  than nasopharyngeal swab, presence of viral mutation(s) within the  areas targeted by this assay, and inadequate number of viral copies  (<250 copies / mL). A negative result must be combined with clinical  observations, patient history, and epidemiological information. If result is POSITIVE SARS-CoV-2 target nucleic acids are DETECTED.  The SARS-CoV-2 RNA is generally detectable in upper and lower  respiratory specimens dur ing the acute phase of infection.  Positive  results are indicative of active infection with SARS-CoV-2.  Clinical  correlation with patient history and other diagnostic information is  necessary to determine patient infection status.  Positive results do  not rule out bacterial infection or co-infection with other viruses. If result is PRESUMPTIVE POSTIVE SARS-CoV-2 nucleic acids MAY BE PRESENT.   A presumptive positive result was obtained on the submitted specimen  and confirmed on repeat testing.  While 2019 novel coronavirus  (SARS-CoV-2) nucleic acids may  be present in the submitted sample  additional confirmatory testing may be necessary for epidemiological  and / or clinical management purposes  to differentiate between  SARS-CoV-2 and other Sarbecovirus currently known to infect humans.  If clinically indicated additional testing with an alternate test  methodology [phone]) is advised. The SARS-CoV-2 RNA is generally  detectable in upper and lower respiratory sp ecimens during the acute  phase of infection. The expected result is Negative. Fact Sheet for Patients:  StrictlyIdeas.no Fact Sheet for Healthcare Providers: BankingDealers.co.za This test is not yet approved or cleared by the [place] FDA and has been authorized for detection and/or diagnosis of SARS-CoV-2 by FDA under an Emergency Use Authorization (EUA).  This EUA will remain in effect (meaning this test can be used) for the duration of the COVID-19 declaration under Section 564(b)(1) of the Act, 21 U.S.C. section 360bbb-3(b)(1), unless the authorization is terminated or revoked sooner. Performed at [facility], [place] [address], [place], [place] [address]   Surgical pcr screen     Status: None   Collection Time: 06/02/18 10:05 AM  Result Value Ref Range Status    MRSA, PCR NEGATIVE NEGATIVE Final   Staphylococcus aureus NEGATIVE NEGATIVE Final    Comment: (NOTE) The Xpert SA Assay (FDA approved for NASAL specimens in patients 59 years of age and older), is one component of a comprehensive surveillance program. It is not intended to diagnose infection nor to guide or monitor treatment. Performed at [facility], [place] [address] [place], [place] [address]      Radiology Studies: No results found.  Scheduled Meds: . bictegravir-emtricitabine-tenofovir AF  1 tablet Oral Daily  . enoxaparin (LOVENOX) injection  40 mg Subcutaneous Q24H  . fluticasone  1 spray Each Nare Daily  . fluticasone furoate-vilanterol  1 puff Inhalation Daily   And  . umeclidinium bromide  1 puff Inhalation Daily  . insulin aspart  0-5 Units Subcutaneous QHS  . insulin aspart  0-9 Units Subcutaneous TID WC  . insulin detemir  12 Units Subcutaneous QHS  . ipratropium-albuterol  3 mL Nebulization BID  . montelukast  10 mg Oral QHS  . pregabalin  225 mg Oral BID  . rosuvastatin  20 mg Oral Daily  . sertraline  200 mg Oral Daily   Continuous Infusions:   LOS: 6 days   Marylu Lund, MD [facility] Pager On Amion  If 7PM-7AM, please contact night-coverage 06/08/2018, 3:37 PM

## 2018-06-09 ENCOUNTER — Inpatient Hospital Stay (HOSPITAL_COMMUNITY): Payer: Medicare HMO

## 2018-06-09 LAB — GLUCOSE, CAPILLARY
Glucose-Capillary: 278 mg/dL — ABNORMAL HIGH (ref 70–99)
Glucose-Capillary: 258 mg/dL — ABNORMAL HIGH (ref 70–99)
Glucose-Capillary: 291 mg/dL — ABNORMAL HIGH (ref 70–99)
Glucose-Capillary: 360 mg/dL — ABNORMAL HIGH (ref 70–99)

## 2018-06-09 MED ORDER — INSULIN DETEMIR 100 UNIT/ML ~~LOC~~ SOLN
14.0000 [IU] | Freq: Every day | SUBCUTANEOUS | Status: DC
  Administered 2018-06-09: 14 [IU] via SUBCUTANEOUS
  Filled 2018-06-09 (×2): qty 0.14

## 2018-06-09 MED ORDER — INSULIN ASPART 100 UNIT/ML ~~LOC~~ SOLN
3.0000 [IU] | Freq: Three times a day (TID) | SUBCUTANEOUS | Status: DC
  Administered 2018-06-09 – 2018-06-10 (×4): 3 [IU] via SUBCUTANEOUS

## 2018-06-09 MED ORDER — MORPHINE SULFATE (PF) 2 MG/ML IV SOLN
2.0000 mg | Freq: Once | INTRAVENOUS | Status: AC
  Administered 2018-06-09: 2 mg via INTRAVENOUS
  Filled 2018-06-09: qty 1

## 2018-06-09 MED ORDER — OXYCODONE HCL 5 MG PO TABS
10.0000 mg | ORAL_TABLET | ORAL | Status: DC | PRN
  Administered 2018-06-09 – 2018-06-23 (×65): 10 mg via ORAL
  Filled 2018-06-09 (×68): qty 2

## 2018-06-09 NOTE — Plan of Care (Signed)
Continue plan of care, encourage pt to eat meals to keep energy level, protein levels up for wound healing.

## 2018-06-09 NOTE — Progress Notes (Signed)
Inpatient Diabetes Program Recommendations  AACE/ADA: New Consensus Statement on Inpatient Glycemic Control (2015)  Target Ranges:  Prepandial:   less than 140 mg/dL      Peak postprandial:   less than 180 mg/dL (1-2 hours)      Critically ill patients:  140 - 180 mg/dL   Lab Results  Component Value Date   GLUCAP 278 (H) 06/09/2018   HGBA1C 8.6 (H) 01/21/2018    Review of Glycemic Control  Blood sugars 205-287 mg/dL over past 24H Needs insulin adjustment.  Inpatient Diabetes Program Recommendations:     Increase Levemir to 14 units QHS Add Novolog 3 units tidwc for meal coverage insulin if pt eats > 50% meal.  Will follow closely.  Thank you. Lorenda Peck, RD, LDN, CDE Inpatient Diabetes Coordinator [phone]

## 2018-06-09 NOTE — Progress Notes (Signed)
Pain is well controlled.  Elevating RLE at all times. Swelling is not significantly improved.  Continues to have fracture blisters. Medial skin has small eschar.  Overall soft tissues are not ready for surgery yet. Bandages were replaced today. Continue elevation. Will repeat ankle xrays today. Will reevaluate Wednesday.  Azucena Cecil, MD [place] 12:48 PM

## 2018-06-09 NOTE — Progress Notes (Signed)
PROGRESS NOTE    [name]  URK:[id number] DOB: December 04, 1965 DOA: 06/01/2018 PCP: [name], PA-C    Brief Narrative:  53 y.o.femalewith medical history significant ofsevere COPD on home O2, HVOS, CKD, HIV, DM2 s/p L BKA.  Patient suffered mechanical fall at home. Didn't hit head in fall. Has obvious ankle deformity but apparently was able to walk on ankle without significant pain secondary to her very advanced diabetic neuropathy.  Per EDP notes: Patient states APS did come out and checked her and did not see anything concerning so they left but cousin came later and called EMS. Patient denies any other injuries, head trauma, LOC, etc. She states she has isolated pain in right ankle. She has not tried to get up and move around. She is generally wheelchair bound due to left BKA with prosthesis. Patient states otherwise she has been well.  CBG was low with EMS so they gave PO glucose, additionally CBG was 54 on arrival so she got D50 (would bottom out again at 32 later this morning necessitating further D50 and a D10 gtt).  She was reportedly confused and kept trying to get out of bed and walk despite the ankle fracture in ED. Reduction was performed without sedation and no pain meds were given secondary to her not having much ankle pain due to diabetic neuropathy. Had dopplerable DP post reduction per EDP. CT head neg, CXR neg. On my evaluation however, patient is lethargic and difficult to arouse (see physical exam below).  COVID negative.  Assessment & Plan:   Principal Problem:   Displaced bimalleolar fracture of right ankle, closed, initial encounter Active Problems:   Human immunodeficiency virus (HIV) disease ([place])   Diabetes mellitus type 2 with complications, uncontrolled ([place])   COPD (chronic obstructive pulmonary disease) ([place])   Obesity hypoventilation syndrome ([name])   Acute metabolic encephalopathy   Acute on chronic respiratory failure with  hypoxia and hypercapnia ([name])   CKD (chronic kidney disease), stage III ([name])  Acute metabolic encephalopathy Acute hypoxic hypercapnic respiratory failure OSA versus obesity hypoventilation syndrome Patient with significant COPD on home oxygen at 3 L per nasal cannula.   Following surgical intervention of her right ankle fracture, patient became hypoxic and encephalopathic likely secondary to respiratory failure.   Chest x-ray with no acute cardiopulmonary findings.  COVID-19 test negative.  Patient likely suffers from obesity hypoventilation syndrome in addition to OSA.   Would likely benefit from bipap/cpap at night, pt has been refusing  Rightbimalleolar anklefracture with dislocation  Patient with apparent fall at home, is typically wheelchair-bound.  X-ray right ankle notable for acute comminuted distal fibular fracture with lateral angulation and displaced medial malleolar fracture. --[facility] following, appreciate assistance --Underwent external fixation on 06/03/2018 --Recently discussed with Dr. Erlinda Hong, plans for surgery early this week --Continue nonweightbearing status to right lower extremity --Tramadol/oxycodone PRN  Hypoglycemia IDDM Hemoglobin A1c 8.6 on January 2020.  Follows with endocrine, Dr. Dwyane Dee outpatient.  Home regimen includes Toujeo 50 units twice daily, Humulin  R U-500 45u breakfast, 30u Lunch and 15u prior to snacks; also on Invokana.  Patient with blood glucose of 32 at 0230 on 06/02/2018.  Etiology likley 2/2 prolonged n.p.o. status for ankle fracture with insulin likely contributing to this event.  Patient was started on D10 and switch to D5 half-normal saline. --Currently on Lantus 12u qHS --Novolog sensitive insulin sliding scale for coverage --Glucose in the 200's. Will increase lantus to 14 units with addition of 3 units aspart  before meals  CKD stage III Most recent creatinine 1.04.  Within baseline. --Avoid nephrotoxins, renally dose  all medications --Recheck bmet in AM  HIV Follows with infectious disease, Dr. Johnnye Sima outpatient.  Last HIV RNA quant undetectable 05/01/2018. --Continue HAART as tolerated. Pharmacy cont to follow following  COPD, severe Tobacco abuse disorder Follows with pulmonology, Dr. Halford Chessman outpatient.  Spirometry March 2020 with an FEV1 of 1.1 which is 35% of predicted.  On Trelegy Ellipta at home and oxygen at 3L/min --Continue supplemental oxygen, titrate for SPO2 greater than 88% --No wheezing at this time. Cont bronchodilator as needed  HLD: Continue Crestor -Stable at this time  Peripheral neuropathy: Continue Lyrica 225 mg p.o. twice daily -Presently stable  Morbid obesity -Discussed with patient need for aggressive weight loss measures as this complicates all facets of care. -Currently stable  DVT prophylaxis: Lovenox subQ Code Status: DNR Family Communication: Pt in room, family not at bedside Disposition Plan: Uncertain at this time  Consultants:   Orthopedics -[facility], Dr. Louanne Skye, Dr. Erlinda Hong  Procedures:   External fixation right malleoli are ankle fracture on 06/02/2018  Pending staged ORIF  Antimicrobials: Anti-infectives (From admission, onward)   Start     Dose/Rate Route Frequency Ordered Stop   06/05/18 1000  bictegravir-emtricitabine-tenofovir AF (BIKTARVY) 50-200-25 MG per tablet 1 tablet     1 tablet Oral Daily 06/04/18 1216     06/04/18 0000  bictegravir-emtricitabine-tenofovir AF (BIKTARVY) 50-200-25 MG TABS tablet     1 tablet Oral Daily 06/04/18 1217     06/02/18 1500  ceFAZolin (ANCEF) IVPB 2g/100 mL premix    Note to Pharmacy:  Anesthesia to give preop   2 g 200 mL/hr over 30 Minutes Intravenous  Once 06/02/18 1451 06/02/18 1516   06/02/18 1452  ceFAZolin (ANCEF) 2-4 GM/100ML-% IVPB    Note to Pharmacy:  Henrine Screws   : cabinet override      06/02/18 1452 06/02/18 1516   06/02/18 1000  elvitegravir-cobicistat-emtricitabine-tenofovir  (GENVOYA) 150-150-200-10 MG tablet 1 tablet  Status:  Discontinued     1 tablet Oral Daily 06/02/18 0256 06/04/18 1216      Subjective: No complaints at this time  Objective: Vitals:   06/09/18 0405 06/09/18 0712 06/09/18 0715 06/09/18 1059  BP: 120/74     Pulse: 73     Resp: 10     Temp:    98.4 F (36.9 C)  TempSrc:    Oral  SpO2: 97% 97% 98%   Weight:      Height:        Intake/Output Summary (Last 24 hours) at 06/09/2018 1220 Last data filed at 06/09/2018 1100 Gross per 24 hour  Intake 664 ml  Output 1400 ml  Net -736 ml   Filed Weights   06/01/18 2224  Weight: 106.1 kg    Examination: General exam: Asleep, arousable, laying in bed, in nad Respiratory system: Normal respiratory effort, no wheezing Cardiovascular system: regular rate, s1, s2 Gastrointestinal system: Soft, nondistended, positive BS, morbidly obese Central nervous system: CN2-12 grossly intact, strength intact Extremities: Perfused, no clubbing Skin: Normal skin turgor, no notable skin lesions seen Psychiatry: Mood normal // no visual hallucinations   Data Reviewed: I have personally reviewed following labs and imaging studies  CBC: Recent Labs  Lab 06/03/18 0628 06/04/18 0436  WBC 12.9* 11.9*  HGB 10.8* 10.2*  HCT 35.4* 33.4*  MCV 93.2 94.4  PLT 186 810   Basic Metabolic Panel: Recent Labs  Lab 06/03/18 0628 06/04/18  0436  NA 141 141  K 3.5 3.8  CL 101 101  CO2 28 27  GLUCOSE 208* 163*  BUN 14 14  CREATININE 1.09* 1.04*  CALCIUM 8.7* 8.7*  MG  --  1.7   GFR: Estimated Creatinine Clearance: 81.5 mL/min (A) (by C-G formula based on SCr of 1.04 mg/dL (H)). Liver Function Tests: No results for input(s): AST, ALT, ALKPHOS, BILITOT, PROT, ALBUMIN in the last 168 hours. No results for input(s): LIPASE, AMYLASE in the last 168 hours. No results for input(s): AMMONIA in the last 168 hours. Coagulation Profile: No results for input(s): INR, PROTIME in the last 168 hours. Cardiac  Enzymes: No results for input(s): CKTOTAL, CKMB, CKMBINDEX, TROPONINI in the last 168 hours. BNP (last 3 results) No results for input(s): PROBNP in the last 8760 hours. HbA1C: No results for input(s): HGBA1C in the last 72 hours. CBG: Recent Labs  Lab 06/08/18 1152 06/08/18 1636 06/08/18 2135 06/09/18 0843 06/09/18 1144  GLUCAP 246* 205* 287* 278* 258*   Lipid Profile: No results for input(s): CHOL, HDL, LDLCALC, TRIG, CHOLHDL, LDLDIRECT in the last 72 hours. Thyroid Function Tests: No results for input(s): TSH, T4TOTAL, FREET4, T3FREE, THYROIDAB in the last 72 hours. Anemia Panel: No results for input(s): VITAMINB12, FOLATE, FERRITIN, TIBC, IRON, RETICCTPCT in the last 72 hours. Sepsis Labs: No results for input(s): PROCALCITON, LATICACIDVEN in the last 168 hours.  Recent Results (from the past 240 hour(s))  SARS Coronavirus 2 (CEPHEID - Performed in [facility] hospital lab), Hosp Order     Status: None   Collection Time: 06/02/18  2:00 AM  Result Value Ref Range Status   SARS Coronavirus 2 NEGATIVE NEGATIVE Final    Comment: (NOTE) If result is NEGATIVE SARS-CoV-2 target nucleic acids are NOT DETECTED. The SARS-CoV-2 RNA is generally detectable in upper and lower  respiratory specimens during the acute phase of infection. The lowest  concentration of SARS-CoV-2 viral copies this assay can detect is 250  copies / mL. A negative result does not preclude SARS-CoV-2 infection  and should not be used as the sole basis for treatment or other  patient management decisions.  A negative result may occur with  improper specimen collection / handling, submission of specimen other  than nasopharyngeal swab, presence of viral mutation(s) within the  areas targeted by this assay, and inadequate number of viral copies  (<250 copies / mL). A negative result must be combined with clinical  observations, patient history, and epidemiological information. If result is POSITIVE SARS-CoV-2  target nucleic acids are DETECTED. The SARS-CoV-2 RNA is generally detectable in upper and lower  respiratory specimens dur ing the acute phase of infection.  Positive  results are indicative of active infection with SARS-CoV-2.  Clinical  correlation with patient history and other diagnostic information is  necessary to determine patient infection status.  Positive results do  not rule out bacterial infection or co-infection with other viruses. If result is PRESUMPTIVE POSTIVE SARS-CoV-2 nucleic acids MAY BE PRESENT.   A presumptive positive result was obtained on the submitted specimen  and confirmed on repeat testing.  While 2019 novel coronavirus  (SARS-CoV-2) nucleic acids may be present in the submitted sample  additional confirmatory testing may be necessary for epidemiological  and / or clinical management purposes  to differentiate between  SARS-CoV-2 and other Sarbecovirus currently known to infect humans.  If clinically indicated additional testing with an alternate test  methodology [phone]) is advised. The SARS-CoV-2 RNA is generally  detectable  in upper and lower respiratory sp ecimens during the acute  phase of infection. The expected result is Negative. Fact Sheet for Patients:  StrictlyIdeas.no Fact Sheet for Healthcare Providers: BankingDealers.co.za This test is not yet approved or cleared by the [place] FDA and has been authorized for detection and/or diagnosis of SARS-CoV-2 by FDA under an Emergency Use Authorization (EUA).  This EUA will remain in effect (meaning this test can be used) for the duration of the COVID-19 declaration under Section 564(b)(1) of the Act, 21 U.S.C. section 360bbb-3(b)(1), unless the authorization is terminated or revoked sooner. Performed at [facility], [place] [address], [place], [place] [address]   Surgical pcr screen     Status: None   Collection Time: 06/02/18 10:05 AM   Result Value Ref Range Status   MRSA, PCR NEGATIVE NEGATIVE Final   Staphylococcus aureus NEGATIVE NEGATIVE Final    Comment: (NOTE) The Xpert SA Assay (FDA approved for NASAL specimens in patients 63 years of age and older), is one component of a comprehensive surveillance program. It is not intended to diagnose infection nor to guide or monitor treatment. Performed at [facility], [place] [address] [place],  [address]      Radiology Studies: No results found.  Scheduled Meds: . bictegravir-emtricitabine-tenofovir AF  1 tablet Oral Daily  . enoxaparin (LOVENOX) injection  40 mg Subcutaneous Q24H  . fluticasone  1 spray Each Nare Daily  . fluticasone furoate-vilanterol  1 puff Inhalation Daily   And  . umeclidinium bromide  1 puff Inhalation Daily  . insulin aspart  0-5 Units Subcutaneous QHS  . insulin aspart  0-9 Units Subcutaneous TID WC  . insulin aspart  3 Units Subcutaneous TID WC  . insulin detemir  14 Units Subcutaneous QHS  . montelukast  10 mg Oral QHS  . pregabalin  225 mg Oral BID  . rosuvastatin  20 mg Oral Daily  . sertraline  200 mg Oral Daily   Continuous Infusions:   LOS: 7 days   Marylu Lund, MD [facility] Pager On Amion  If 7PM-7AM, please contact night-coverage 06/09/2018, 12:20 PM

## 2018-06-09 NOTE — Consult Note (Signed)
   [facility] CM Inpatient Consult   06/09/2018  [name] 1965-08-13 [id number]   Patient was assessed for [facility] Management for community services. Patient was previously outreach attempts with [facility] Management team without success. Patient is currently with a high risk score of 27% for unplanned readmission with having 2 hospitalizations and 2 ED visits in the past 6 months in the [facility].  The following information is from the MD brief narrative notes 06/08/2018 as follows:  53 y.o.femalewith medical history significant ofsevere COPD on home O2, HVOS, CKD, HIV, DM2 s/p L BKA.  Patient suffered mechanical fall at home. Didn't hit head in fall. Has obvious ankle deformity but apparently was able to walk on ankle without significant pain secondary to her very advanced diabetic neuropathy.  Per EDP notes: Patient states APS did come out and checked her and did not see anything concerning so they left but cousin came later and called EMS. Patient denies any other injuries, head trauma, LOC, etc. She states she has isolated pain in right ankle. She has not tried to get up and move around. She is generally wheelchair bound due to left BKA with prosthesis.  Will follow for disposition as appropriate for any [facility] Care management post hospital follow up needs.  Patient has not been assessed by PT currently.  Plan: Will follow for progress in notes and with inpatient [facility] team as appropriate.    Of note, [facility] Care Management services does not replace or interfere with any services that are arranged by inpatient TOC case management or social work.   For additional questions or referrals please contact:   Natividad Brood, RN BSN [facility] Liaison  [phone] business mobile phone Toll free office [phone]  Fax number: [phone] [email or web address]

## 2018-06-10 LAB — GLUCOSE, CAPILLARY
Glucose-Capillary: 272 mg/dL — ABNORMAL HIGH (ref 70–99)
Glucose-Capillary: 195 mg/dL — ABNORMAL HIGH (ref 70–99)
Glucose-Capillary: 147 mg/dL — ABNORMAL HIGH (ref 70–99)
Glucose-Capillary: 128 mg/dL — ABNORMAL HIGH (ref 70–99)

## 2018-06-10 MED ORDER — INSULIN DETEMIR 100 UNIT/ML ~~LOC~~ SOLN
18.0000 [IU] | Freq: Every day | SUBCUTANEOUS | Status: DC
  Administered 2018-06-10 – 2018-06-11 (×2): 18 [IU] via SUBCUTANEOUS
  Filled 2018-06-10 (×3): qty 0.18

## 2018-06-10 MED ORDER — NAPHAZOLINE-GLYCERIN 0.012-0.2 % OP SOLN
1.0000 [drp] | Freq: Four times a day (QID) | OPHTHALMIC | Status: DC | PRN
  Administered 2018-06-10: 1 [drp] via OPHTHALMIC
  Administered 2018-06-11: 2 [drp] via OPHTHALMIC
  Administered 2018-06-12 – 2018-06-13 (×2): 1 [drp] via OPHTHALMIC
  Filled 2018-06-10: qty 15

## 2018-06-10 MED ORDER — INSULIN ASPART 100 UNIT/ML ~~LOC~~ SOLN
5.0000 [IU] | Freq: Three times a day (TID) | SUBCUTANEOUS | Status: DC
  Administered 2018-06-10 – 2018-06-17 (×20): 5 [IU] via SUBCUTANEOUS

## 2018-06-10 NOTE — Progress Notes (Signed)
PROGRESS NOTE    [name]  QPR:[id number] DOB: [birth year] DOA: 06/01/2018 PCP: [name], PA-C    Brief Narrative:  53 y.o.femalewith medical history significant ofsevere COPD on home O2, HVOS, CKD, HIV, DM2 s/p L BKA.  Patient suffered mechanical fall at home. Didn't hit head in fall. Has obvious ankle deformity but apparently was able to walk on ankle without significant pain secondary to her very advanced diabetic neuropathy.  Per EDP notes: Patient states APS did come out and checked her and did not see anything concerning so they left but cousin came later and called EMS. Patient denies any other injuries, head trauma, LOC, etc. She states she has isolated pain in right ankle. She has not tried to get up and move around. She is generally wheelchair bound due to left BKA with prosthesis. Patient states otherwise she has been well.  CBG was low with EMS so they gave PO glucose, additionally CBG was 54 on arrival so she got D50 (would bottom out again at 32 later this morning necessitating further D50 and a D10 gtt).  She was reportedly confused and kept trying to get out of bed and walk despite the ankle fracture in ED. Reduction was performed without sedation and no pain meds were given secondary to her not having much ankle pain due to diabetic neuropathy. Had dopplerable DP post reduction per EDP. CT head neg, CXR neg. On my evaluation however, patient is lethargic and difficult to arouse (see physical exam below).  COVID negative.  Assessment & Plan:   Principal Problem:   Displaced bimalleolar fracture of right ankle, closed, initial encounter Active Problems:   Human immunodeficiency virus (HIV) disease ([place])   Diabetes mellitus type 2 with complications, uncontrolled ([place])   COPD (chronic obstructive pulmonary disease) ([place])   Obesity hypoventilation syndrome ([name])   Acute metabolic encephalopathy   Acute on chronic respiratory failure with  hypoxia and hypercapnia ([name])   CKD (chronic kidney disease), stage III ([name])  Acute metabolic encephalopathy Acute hypoxic hypercapnic respiratory failure OSA versus obesity hypoventilation syndrome Patient with significant COPD on home oxygen at 3 L per nasal cannula.   Following surgical intervention of her right ankle fracture, patient became hypoxic and encephalopathic likely secondary to respiratory failure.   Chest x-ray with no acute cardiopulmonary findings.  COVID-19 test negative.  Patient likely suffers from obesity hypoventilation syndrome in addition to OSA.   Would likely benefit from bipap/cpap at night however pt has been refusing  Rightbimalleolar anklefracture with dislocation  Patient with apparent fall at home, is typically wheelchair-bound.  X-ray right ankle notable for acute comminuted distal fibular fracture with lateral angulation and displaced medial malleolar fracture. --[facility]Ortho care following, appreciate assistance --Underwent external fixation on 06/03/2018 --Continue nonweightbearing status to right lower extremity --Tramadol/oxycodone PRN --Per Orthopedic Surgery, ankle swelling not yet resolved, Ortho to re-evaluate in next day or two  Hypoglycemia IDDM Hemoglobin A1c 8.6 on January 2020.  Follows with endocrine, Dr. Dwyane Dee outpatient.  Home regimen includes Toujeo 50 units twice daily, Humulin  R U-500 45u breakfast, 30u Lunch and 15u prior to snacks; also on Invokana.  Patient with blood glucose of 32 at 0230 on 06/02/2018.  Etiology likley 2/2 prolonged n.p.o. status for ankle fracture with insulin likely contributing to this event.  Patient was started on D10 and switch to D5 half-normal saline. --Currently on Lantus 12u qHS --Novolog sensitive insulin sliding scale for coverage --Glucose remains around the 200's. Will further increase lantus to  18 units and aspart to 5 units  CKD stage III Most recent creatinine 1.04.  Within baseline.  --Avoid nephrotoxins, renally dose all medications --Will repeat bmet in AM  HIV Follows with infectious disease, Dr. Johnnye Sima outpatient.  Last HIV RNA quant undetectable 05/01/2018. --Continue HAART as tolerated. Pharmacy cont to follow following  COPD, severe Tobacco abuse disorder Follows with pulmonology, Dr. Halford Chessman outpatient.  Spirometry March 2020 with an FEV1 of 1.1 which is 35% of predicted.  On Trelegy Ellipta at home and oxygen at 3L/min --Continue supplemental oxygen, titrate for SPO2 greater than 88% --No wheezing at this time. Continue with bronchodilator as needed  HLD: Continue Crestor -Remains stable at this time  Peripheral neuropathy: Continue Lyrica 225 mg p.o. twice daily -Currently stable  Morbid obesity -Discussed with patient need for aggressive weight loss measures as this complicates all facets of care. -Presently stable  DVT prophylaxis: Lovenox subQ Code Status: DNR Family Communication: Pt in room, family not at bedside Disposition Plan: Uncertain at this time  Consultants:   Orthopedics -[facility], Dr. Louanne Skye, Dr. Erlinda Hong  Procedures:   External fixation right malleoli are ankle fracture on 06/02/2018  Pending staged ORIF  Antimicrobials: Anti-infectives (From admission, onward)   Start     Dose/Rate Route Frequency Ordered Stop   06/05/18 1000  bictegravir-emtricitabine-tenofovir AF (BIKTARVY) 50-200-25 MG per tablet 1 tablet     1 tablet Oral Daily 06/04/18 1216     06/04/18 0000  bictegravir-emtricitabine-tenofovir AF (BIKTARVY) 50-200-25 MG TABS tablet     1 tablet Oral Daily 06/04/18 1217     06/02/18 1500  ceFAZolin (ANCEF) IVPB 2g/100 mL premix    Note to Pharmacy:  Anesthesia to give preop   2 g 200 mL/hr over 30 Minutes Intravenous  Once 06/02/18 1451 06/02/18 1516   06/02/18 1452  ceFAZolin (ANCEF) 2-4 GM/100ML-% IVPB    Note to Pharmacy:  Henrine Screws   : cabinet override      06/02/18 1452 06/02/18 1516   06/02/18  1000  elvitegravir-cobicistat-emtricitabine-tenofovir (GENVOYA) 150-150-200-10 MG tablet 1 tablet  Status:  Discontinued     1 tablet Oral Daily 06/02/18 0256 06/04/18 1216      Subjective: Without complaints this AM  Objective: Vitals:   06/10/18 0034 06/10/18 0145 06/10/18 0742 06/10/18 1154  BP: 120/74 125/66    Pulse: 69 68    Resp: 12     Temp: 98.4 F (36.9 C)  97.6 F (36.4 C) (P) 98.3 F (36.8 C)  TempSrc: Oral  Oral (P) Oral  SpO2: 98% 97% 97%   Weight:      Height:        Intake/Output Summary (Last 24 hours) at 06/10/2018 1410 Last data filed at 06/10/2018 0400 Gross per 24 hour  Intake 360 ml  Output -  Net 360 ml   Filed Weights   06/01/18 2224  Weight: 106.1 kg    Examination: General exam: Conversant, in no acute distress Respiratory system: normal chest rise, clear, no audible wheezing Cardiovascular system: regular rhythm, s1-s2 Gastrointestinal system: Nondistended, nontender, pos BS, morbidly obese Central nervous system: No seizures, no tremors Extremities: No cyanosis, no joint deformities, R ankle hardware in place Skin: No rashes, no pallor Psychiatry: Affect normal // no auditory hallucinations   Data Reviewed: I have personally reviewed following labs and imaging studies  CBC: Recent Labs  Lab 06/04/18 0436  WBC 11.9*  HGB 10.2*  HCT 33.4*  MCV 94.4  PLT 326   Basic Metabolic  Panel: Recent Labs  Lab 06/04/18 0436  NA 141  K 3.8  CL 101  CO2 27  GLUCOSE 163*  BUN 14  CREATININE 1.04*  CALCIUM 8.7*  MG 1.7   GFR: Estimated Creatinine Clearance: 81.5 mL/min (A) (by C-G formula based on SCr of 1.04 mg/dL (H)). Liver Function Tests: No results for input(s): AST, ALT, ALKPHOS, BILITOT, PROT, ALBUMIN in the last 168 hours. No results for input(s): LIPASE, AMYLASE in the last 168 hours. No results for input(s): AMMONIA in the last 168 hours. Coagulation Profile: No results for input(s): INR, PROTIME in the last 168 hours.  Cardiac Enzymes: No results for input(s): CKTOTAL, CKMB, CKMBINDEX, TROPONINI in the last 168 hours. BNP (last 3 results) No results for input(s): PROBNP in the last 8760 hours. HbA1C: No results for input(s): HGBA1C in the last 72 hours. CBG: Recent Labs  Lab 06/09/18 1144 06/09/18 1646 06/09/18 2144 06/10/18 0755 06/10/18 1152  GLUCAP 258* 291* 360* 272* 195*   Lipid Profile: No results for input(s): CHOL, HDL, LDLCALC, TRIG, CHOLHDL, LDLDIRECT in the last 72 hours. Thyroid Function Tests: No results for input(s): TSH, T4TOTAL, FREET4, T3FREE, THYROIDAB in the last 72 hours. Anemia Panel: No results for input(s): VITAMINB12, FOLATE, FERRITIN, TIBC, IRON, RETICCTPCT in the last 72 hours. Sepsis Labs: No results for input(s): PROCALCITON, LATICACIDVEN in the last 168 hours.  Recent Results (from the past 240 hour(s))  SARS Coronavirus 2 (CEPHEID - Performed in [facility] hospital lab), Hosp Order     Status: None   Collection Time: 06/02/18  2:00 AM  Result Value Ref Range Status   SARS Coronavirus 2 NEGATIVE NEGATIVE Final    Comment: (NOTE) If result is NEGATIVE SARS-CoV-2 target nucleic acids are NOT DETECTED. The SARS-CoV-2 RNA is generally detectable in upper and lower  respiratory specimens during the acute phase of infection. The lowest  concentration of SARS-CoV-2 viral copies this assay can detect is 250  copies / mL. A negative result does not preclude SARS-CoV-2 infection  and should not be used as the sole basis for treatment or other  patient management decisions.  A negative result may occur with  improper specimen collection / handling, submission of specimen other  than nasopharyngeal swab, presence of viral mutation(s) within the  areas targeted by this assay, and inadequate number of viral copies  (<250 copies / mL). A negative result must be combined with clinical  observations, patient history, and epidemiological information. If result is POSITIVE  SARS-CoV-2 target nucleic acids are DETECTED. The SARS-CoV-2 RNA is generally detectable in upper and lower  respiratory specimens dur ing the acute phase of infection.  Positive  results are indicative of active infection with SARS-CoV-2.  Clinical  correlation with patient history and other diagnostic information is  necessary to determine patient infection status.  Positive results do  not rule out bacterial infection or co-infection with other viruses. If result is PRESUMPTIVE POSTIVE SARS-CoV-2 nucleic acids MAY BE PRESENT.   A presumptive positive result was obtained on the submitted specimen  and confirmed on repeat testing.  While 2019 novel coronavirus  (SARS-CoV-2) nucleic acids may be present in the submitted sample  additional confirmatory testing may be necessary for epidemiological  and / or clinical management purposes  to differentiate between  SARS-CoV-2 and other Sarbecovirus currently known to infect humans.  If clinically indicated additional testing with an alternate test  methodology [phone]) is advised. The SARS-CoV-2 RNA is generally  detectable in upper and lower respiratory  sp ecimens during the acute  phase of infection. The expected result is Negative. Fact Sheet for Patients:  StrictlyIdeas.no Fact Sheet for Healthcare Providers: BankingDealers.co.za This test is not yet approved or cleared by the [place] FDA and has been authorized for detection and/or diagnosis of SARS-CoV-2 by FDA under an Emergency Use Authorization (EUA).  This EUA will remain in effect (meaning this test can be used) for the duration of the COVID-19 declaration under Section 564(b)(1) of the Act, 21 U.S.C. section 360bbb-3(b)(1), unless the authorization is terminated or revoked sooner. Performed at [facility], [place] [address], [place], [place] [address]   Surgical pcr screen     Status: None   Collection Time: 06/02/18  10:05 AM  Result Value Ref Range Status   MRSA, PCR NEGATIVE NEGATIVE Final   Staphylococcus aureus NEGATIVE NEGATIVE Final    Comment: (NOTE) The Xpert SA Assay (FDA approved for NASAL specimens in patients 86 years of age and older), is one component of a comprehensive surveillance program. It is not intended to diagnose infection nor to guide or monitor treatment. Performed at [facility], [place] [address], [place],  [address]      Radiology Studies: Dg Ankle Right Port  Result Date: 06/09/2018 CLINICAL DATA:  Trimalleolar ankle fracture and external fixation. EXAM: PORTABLE RIGHT ANKLE - 2 VIEW COMPARISON:  CT scan 06/03/2018 FINDINGS: Stable appearing position and alignment of the trimalleolar ankle fractures. Stable mild widening of the lateral ankle mortise. IMPRESSION: Stable position alignment of the trimalleolar ankle fractures. Electronically Signed   By: Marijo Sanes M.D.   On: 06/09/2018 13:56    Scheduled Meds: . bictegravir-emtricitabine-tenofovir AF  1 tablet Oral Daily  . enoxaparin (LOVENOX) injection  40 mg Subcutaneous Q24H  . fluticasone  1 spray Each Nare Daily  . fluticasone furoate-vilanterol  1 puff Inhalation Daily   And  . umeclidinium bromide  1 puff Inhalation Daily  . insulin aspart  0-5 Units Subcutaneous QHS  . insulin aspart  0-9 Units Subcutaneous TID WC  . insulin aspart  3 Units Subcutaneous TID WC  . insulin detemir  14 Units Subcutaneous QHS  . montelukast  10 mg Oral QHS  . pregabalin  225 mg Oral BID  . rosuvastatin  20 mg Oral Daily  . sertraline  200 mg Oral Daily   Continuous Infusions:   LOS: 8 days   Marylu Lund, MD [facility] Pager On Amion  If 7PM-7AM, please contact night-coverage 06/10/2018, 2:10 PM

## 2018-06-11 LAB — BASIC METABOLIC PANEL
Anion gap: 10 (ref 5–15)
BUN: 22 mg/dL — ABNORMAL HIGH (ref 6–20)
CO2: 31 mmol/L (ref 22–32)
Calcium: 8.9 mg/dL (ref 8.9–10.3)
Chloride: 94 mmol/L — ABNORMAL LOW (ref 98–111)
Creatinine, Ser: 1.07 mg/dL — ABNORMAL HIGH (ref 0.44–1.00)
GFR calc Af Amer: >60 mL/min (ref >60)
GFR calc non Af Amer: 59 mL/min — ABNORMAL LOW (ref >60)
Glucose, Bld: 205 mg/dL — ABNORMAL HIGH (ref 70–99)
Potassium: 3.7 mmol/L (ref 3.5–5.1)
Sodium: 135 mmol/L (ref 135–145)

## 2018-06-11 LAB — CBC
HCT: 34.4 % — ABNORMAL LOW (ref 36.0–46.0)
Hemoglobin: 10.6 g/dL — ABNORMAL LOW (ref 12.0–15.0)
MCH: 28 pg (ref 26.0–34.0)
MCHC: 30.8 g/dL (ref 30.0–36.0)
MCV: 91 fL (ref 80.0–100.0)
Platelets: 282 10*3/uL (ref 150–400)
RBC: 3.78 MIL/uL — ABNORMAL LOW (ref 3.87–5.11)
RDW: 16.3 % — ABNORMAL HIGH (ref 11.5–15.5)
WBC: 13.7 10*3/uL — ABNORMAL HIGH (ref 4.0–10.5)
nRBC: 0 % (ref 0.0–0.2)

## 2018-06-11 LAB — GLUCOSE, CAPILLARY
Glucose-Capillary: 228 mg/dL — ABNORMAL HIGH (ref 70–99)
Glucose-Capillary: 173 mg/dL — ABNORMAL HIGH (ref 70–99)
Glucose-Capillary: 143 mg/dL — ABNORMAL HIGH (ref 70–99)
Glucose-Capillary: 193 mg/dL — ABNORMAL HIGH (ref 70–99)

## 2018-06-11 MED ORDER — ALTEPLASE 2 MG IJ SOLR
2.0000 mg | Freq: Once | INTRAMUSCULAR | Status: AC
  Administered 2018-06-11: 2 mg
  Filled 2018-06-11: qty 2

## 2018-06-11 NOTE — Progress Notes (Signed)
I rechecked her swelling and soft tissues today.  Still not ready for surgery yet. Will reevaluate Monday.  Continue NWB and elevation. Continue pin site care as well as xeroform to the blisters and eschars and skin wounds.

## 2018-06-11 NOTE — Progress Notes (Addendum)
Oxy fell on floor in room, unable to locate to waste. Had to pull one additional pill to give appropriate dose.  Found oxy, wasted.   Patient MASD to buttocks and groin worse. Patient reminded she should agree with turns for breakdown prevention. Said "ok."  Purwhick removed. Patient encouraged to call for bathroom assistance. Stated "when I have to go, I have to go."

## 2018-06-11 NOTE — Progress Notes (Signed)
Patient refused CPAP /BIPAP at this time, states she doesn't wear one at home nor would she like to wear one here. No distress noted at this time.

## 2018-06-11 NOTE — Progress Notes (Signed)
Discussed with RN possibility of discontinuing CVC due to no current IV medications ordered. Per RN, pt has pending surgery scheduled with history of difficult stick and will need line for surgery and post surgery medications. TPA ordered per standing order for no blood return. RN agrees to entering consult when TPA arrives to unit.

## 2018-06-11 NOTE — Progress Notes (Signed)
[facility]  PROGRESS NOTE  [name] KGU:[id number] DOB: 04-26-1965 DOA: 06/01/2018 PCP: [name], PA-C   Brief HPI:   53 year old female with a history of severe COPD on home oxygen, HIV, diabetes mellitus type 2 status post left BKA came to hospital after she suffered mechanical fall at home.  Patient was found to have right bimalleolar fracture with dislocation.  Orthopedics was consulted.  She underwent external fixation on 06/03/2018.   Subjective   This morning patient complains of mild pain, continues to have right foot swelling.   Assessment/Plan:     1. Right bimalleolar ankle fracture/dislocation-x-ray of the ankle showed acute comminuted distal fibular fracture with lateral angulation and displaced medial malleolar fracture.  Orthopedics was consulted, underwent external fixation on 06/03/2018.  Continue nonweightbearing status.  Tramadol/oxycodone PRN.  Plan to undergo ORIF once swelling improves.  Ortho following, likely ORIF on Monday.  2. Metabolic encephalopathy/acute hypoxic hypercapnic respiratory failure-patient has significant COPD, on home oxygen at 3L/min nasal cannula.  Following surgical intervention for right ankle fracture patient became hypoxic and encephalopathy due to respiratory failure.  Chest x-ray showed no acute cardiopulmonary findings.  COVID-19 test was negative.  Likely from obesity hypoventilation syndrome.  Patient will benefit from BiPAP/CPAP at night however she has been refusing.  3. Diabetes mellitus type 2-patient became hypoglycemic on 06/02/2018, likely from prolonged n.p.o. status for ankle fracture with insulin likely contributing to the event.  She was initially started on D10 and switched to D5 half-normal saline.  At this time blood glucose is stable.  Continue Lantus 18 units subcu daily, insulin aspart 5 units 3 times daily.  Continue sliding scale insulin with NovoLog.  4. CKD stage III-most recent creatinine 1.04, at  baseline.  Avoid nephrotoxins.  Follow BMP in a.m.  5. HIV-followed by ID Dr. Johnnye Sima as outpatient.  Continue HAART as tolerated.  6. COPD-stable, no wheezing at this time.  Continue bronchodilator as needed.    CBG: Recent Labs  Lab 06/10/18 1152 06/10/18 1654 06/10/18 2123 06/11/18 0807 06/11/18 1200  GLUCAP 195* 147* 128* 228* 173*    CBC: Recent Labs  Lab 06/11/18 0320  WBC 13.7*  HGB 10.6*  HCT 34.4*  MCV 91.0  PLT 628    Basic Metabolic Panel: Recent Labs  Lab 06/11/18 0320  NA 135  K 3.7  CL 94*  CO2 31  GLUCOSE 205*  BUN 22*  CREATININE 1.07*  CALCIUM 8.9     DVT prophylaxis: Lovenox  Code Status: Full code  Family Communication: No family at bedside  Disposition Plan: To be determined after ORIF next week     Consultants:  Orthopedics  Procedures:  External fixation right malleoli   Antibiotics:   Anti-infectives (From admission, onward)   Start     Dose/Rate Route Frequency Ordered Stop   06/05/18 1000  bictegravir-emtricitabine-tenofovir AF (BIKTARVY) 50-200-25 MG per tablet 1 tablet     1 tablet Oral Daily 06/04/18 1216     06/04/18 0000  bictegravir-emtricitabine-tenofovir AF (BIKTARVY) 50-200-25 MG TABS tablet     1 tablet Oral Daily 06/04/18 1217     06/02/18 1500  ceFAZolin (ANCEF) IVPB 2g/100 mL premix    Note to Pharmacy:  Anesthesia to give preop   2 g 200 mL/hr over 30 Minutes Intravenous  Once 06/02/18 1451 06/02/18 1516   06/02/18 1452  ceFAZolin (ANCEF) 2-4 GM/100ML-% IVPB    Note to Pharmacy:  Henrine Screws   : cabinet override  06/02/18 1452 06/02/18 1516   06/02/18 1000  elvitegravir-cobicistat-emtricitabine-tenofovir (GENVOYA) 150-150-200-10 MG tablet 1 tablet  Status:  Discontinued     1 tablet Oral Daily 06/02/18 0256 06/04/18 1216       Objective   Vitals:   06/11/18 0000 06/11/18 0342 06/11/18 0805 06/11/18 0808  BP: 123/60     Pulse: 74     Resp: 12     Temp:  98.5 F (36.9 C)     TempSrc:  Oral    SpO2: 98%  95% 93%  Weight:      Height:        Intake/Output Summary (Last 24 hours) at 06/11/2018 1516 Last data filed at 06/11/2018 1217 Gross per 24 hour  Intake 760 ml  Output 500 ml  Net 260 ml   Filed Weights   06/01/18 2224  Weight: 106.1 kg     Physical Examination:  General-appears in no acute distress Heart-S1-S2, regular, no murmur auscultated Lungs-clear to auscultation bilaterally, no wheezing or crackles auscultated Abdomen-soft, nontender, no organomegaly Extremities-right ankle hardware in place, status post left BKA Neuro-alert, oriented x3, no focal deficit noted    Data Reviewed: I have personally reviewed following labs and imaging studies   Recent Results (from the past 240 hour(s))  SARS Coronavirus 2 (CEPHEID - Performed in [facility] hospital lab), Hosp Order     Status: None   Collection Time: 06/02/18  2:00 AM  Result Value Ref Range Status   SARS Coronavirus 2 NEGATIVE NEGATIVE Final    Comment: (NOTE) If result is NEGATIVE SARS-CoV-2 target nucleic acids are NOT DETECTED. The SARS-CoV-2 RNA is generally detectable in upper and lower  respiratory specimens during the acute phase of infection. The lowest  concentration of SARS-CoV-2 viral copies this assay can detect is 250  copies / mL. A negative result does not preclude SARS-CoV-2 infection  and should not be used as the sole basis for treatment or other  patient management decisions.  A negative result may occur with  improper specimen collection / handling, submission of specimen other  than nasopharyngeal swab, presence of viral mutation(s) within the  areas targeted by this assay, and inadequate number of viral copies  (<250 copies / mL). A negative result must be combined with clinical  observations, patient history, and epidemiological information. If result is POSITIVE SARS-CoV-2 target nucleic acids are DETECTED. The SARS-CoV-2 RNA is generally detectable  in upper and lower  respiratory specimens dur ing the acute phase of infection.  Positive  results are indicative of active infection with SARS-CoV-2.  Clinical  correlation with patient history and other diagnostic information is  necessary to determine patient infection status.  Positive results do  not rule out bacterial infection or co-infection with other viruses. If result is PRESUMPTIVE POSTIVE SARS-CoV-2 nucleic acids MAY BE PRESENT.   A presumptive positive result was obtained on the submitted specimen  and confirmed on repeat testing.  While 2019 novel coronavirus  (SARS-CoV-2) nucleic acids may be present in the submitted sample  additional confirmatory testing may be necessary for epidemiological  and / or clinical management purposes  to differentiate between  SARS-CoV-2 and other Sarbecovirus currently known to infect humans.  If clinically indicated additional testing with an alternate test  methodology [phone]) is advised. The SARS-CoV-2 RNA is generally  detectable in upper and lower respiratory sp ecimens during the acute  phase of infection. The expected result is Negative. Fact Sheet for Patients:  StrictlyIdeas.no Fact Sheet for Healthcare  Providers: BankingDealers.co.za This test is not yet approved or cleared by the [place] and has been authorized for detection and/or diagnosis of SARS-CoV-2 by FDA under an Emergency Use Authorization (EUA).  This EUA will remain in effect (meaning this test can be used) for the duration of the COVID-19 declaration under Section 564(b)(1) of the Act, 21 U.S.C. section 360bbb-3(b)(1), unless the authorization is terminated or revoked sooner. Performed at [facility], [place] [address], [place], [place] [address]   Surgical pcr screen     Status: None   Collection Time: 06/02/18 10:05 AM  Result Value Ref Range Status   MRSA, PCR NEGATIVE NEGATIVE Final    Staphylococcus aureus NEGATIVE NEGATIVE Final    Comment: (NOTE) The Xpert SA Assay (FDA approved for NASAL specimens in patients 35 years of age and older), is one component of a comprehensive surveillance program. It is not intended to diagnose infection nor to guide or monitor treatment. Performed at [facility], [place] [address], [place], [place] [address]      Liver Function Tests: No results for input(s): AST, ALT, ALKPHOS, BILITOT, PROT, ALBUMIN in the last 168 hours. No results for input(s): LIPASE, AMYLASE in the last 168 hours. No results for input(s): AMMONIA in the last 168 hours.  Cardiac Enzymes: No results for input(s): CKTOTAL, CKMB, CKMBINDEX, TROPONINI in the last 168 hours. BNP (last 3 results) Recent Labs    08/29/17 1222 01/02/18 1703 01/05/18 0147  BNP 69.9 42.7 29.8    ProBNP (last 3 results) No results for input(s): PROBNP in the last 8760 hours.    Studies: No results found.  Scheduled Meds: . bictegravir-emtricitabine-tenofovir AF  1 tablet Oral Daily  . enoxaparin (LOVENOX) injection  40 mg Subcutaneous Q24H  . fluticasone  1 spray Each Nare Daily  . fluticasone furoate-vilanterol  1 puff Inhalation Daily   And  . umeclidinium bromide  1 puff Inhalation Daily  . insulin aspart  0-5 Units Subcutaneous QHS  . insulin aspart  0-9 Units Subcutaneous TID WC  . insulin aspart  5 Units Subcutaneous TID WC  . insulin detemir  18 Units Subcutaneous QHS  . montelukast  10 mg Oral QHS  . pregabalin  225 mg Oral BID  . rosuvastatin  20 mg Oral Daily  . sertraline  200 mg Oral Daily    Admission status: Inpatient: Based on patients clinical presentation and evaluation of above clinical data, I have made determination that patient meets Inpatient criteria at this time.  Time spent: 25 min  [facility] Pager [phone]. If 7PM-7AM, please contact night-coverage at [email or web address], Office  [phone]  password  [email or web address]  06/11/2018, 3:16 PM  LOS: 9 days

## 2018-06-12 LAB — BASIC METABOLIC PANEL
Anion gap: 13 (ref 5–15)
BUN: 18 mg/dL (ref 6–20)
CO2: 29 mmol/L (ref 22–32)
Calcium: 8.8 mg/dL — ABNORMAL LOW (ref 8.9–10.3)
Chloride: 92 mmol/L — ABNORMAL LOW (ref 98–111)
Creatinine, Ser: 1.07 mg/dL — ABNORMAL HIGH (ref 0.44–1.00)
GFR calc Af Amer: >60 mL/min (ref >60)
GFR calc non Af Amer: 59 mL/min — ABNORMAL LOW (ref >60)
Glucose, Bld: 295 mg/dL — ABNORMAL HIGH (ref 70–99)
Potassium: 3.8 mmol/L (ref 3.5–5.1)
Sodium: 134 mmol/L — ABNORMAL LOW (ref 135–145)

## 2018-06-12 LAB — CBC
HCT: 32.8 % — ABNORMAL LOW (ref 36.0–46.0)
Hemoglobin: 10 g/dL — ABNORMAL LOW (ref 12.0–15.0)
MCH: 27.7 pg (ref 26.0–34.0)
MCHC: 30.5 g/dL (ref 30.0–36.0)
MCV: 90.9 fL (ref 80.0–100.0)
Platelets: 292 10*3/uL (ref 150–400)
RBC: 3.61 MIL/uL — ABNORMAL LOW (ref 3.87–5.11)
RDW: 16.2 % — ABNORMAL HIGH (ref 11.5–15.5)
WBC: 13.1 10*3/uL — ABNORMAL HIGH (ref 4.0–10.5)
nRBC: 0 % (ref 0.0–0.2)

## 2018-06-12 LAB — GLUCOSE, CAPILLARY
Glucose-Capillary: 265 mg/dL — ABNORMAL HIGH (ref 70–99)
Glucose-Capillary: 158 mg/dL — ABNORMAL HIGH (ref 70–99)
Glucose-Capillary: 171 mg/dL — ABNORMAL HIGH (ref 70–99)
Glucose-Capillary: 123 mg/dL — ABNORMAL HIGH (ref 70–99)

## 2018-06-12 MED ORDER — INSULIN DETEMIR 100 UNIT/ML ~~LOC~~ SOLN
22.0000 [IU] | Freq: Every day | SUBCUTANEOUS | Status: DC
  Administered 2018-06-12 – 2018-06-14 (×3): 22 [IU] via SUBCUTANEOUS
  Filled 2018-06-12 (×5): qty 0.22

## 2018-06-12 NOTE — Progress Notes (Signed)
[facility]  PROGRESS NOTE  [name] WNU:[id number] DOB: 1965/03/06 DOA: 06/01/2018 PCP: [name], PA-C   Brief HPI:   53 year old female with a history of severe COPD on home oxygen, HIV, diabetes mellitus type 2 status post left BKA came to hospital after she suffered mechanical fall at home.  Patient was found to have right bimalleolar fracture with dislocation.  Orthopedics was consulted.  She underwent external fixation on 06/03/2018.   Subjective   Patient seen and examined, denies foot pain.  No chest pain or shortness of breath.   Assessment/Plan:     1. Right bimalleolar ankle fracture/dislocation-x-ray of the ankle showed acute comminuted distal fibular fracture with lateral angulation and displaced medial malleolar fracture.  Orthopedics was consulted, underwent external fixation on 06/03/2018.  Continue nonweightbearing status.  Tramadol/oxycodone PRN.  Plan to undergo ORIF once swelling improves.  Ortho following, likely ORIF on Monday.  2. Metabolic encephalopathy/acute hypoxic hypercapnic respiratory failure-patient has significant COPD, on home oxygen at 3L/min nasal cannula.  Following surgical intervention for right ankle fracture patient became hypoxic and encephalopathy due to respiratory failure.  Chest x-ray showed no acute cardiopulmonary findings.  COVID-19 test was negative.  Likely from obesity hypoventilation syndrome.  Patient will benefit from BiPAP/CPAP at night however she has been refusing.  3. Diabetes mellitus type 2-patient became hypoglycemic on 06/02/2018, likely from prolonged n.p.o. status for ankle fracture with insulin likely contributing to the event.  She was initially started on D10 and switched to D5 half-normal saline.  At this time blood glucose is elevated in 200s, will change the dose of Levemir to 22 units subcu daily, insulin aspart 5 units 3 times daily.  Continue sliding scale insulin with NovoLog.  4. CKD stage III-most  recent creatinine 1.04, at baseline.  Avoid nephrotoxins.  Follow BMP in a.m.  5. HIV-followed by ID Dr. Johnnye Sima as outpatient.  Continue HAART as tolerated.  6. COPD-stable, no wheezing at this time.  Continue bronchodilator as needed.    CBG: Recent Labs  Lab 06/11/18 1200 06/11/18 1647 06/11/18 2141 06/12/18 0810 06/12/18 1142  GLUCAP 173* 143* 193* 265* 158*    CBC: Recent Labs  Lab 06/11/18 0320 06/12/18 0450  WBC 13.7* 13.1*  HGB 10.6* 10.0*  HCT 34.4* 32.8*  MCV 91.0 90.9  PLT 282 034    Basic Metabolic Panel: Recent Labs  Lab 06/11/18 0320 06/12/18 0450  NA 135 134*  K 3.7 3.8  CL 94* 92*  CO2 31 29  GLUCOSE 205* 295*  BUN 22* 18  CREATININE 1.07* 1.07*  CALCIUM 8.9 8.8*     DVT prophylaxis: Lovenox  Code Status: Full code  Family Communication: No family at bedside  Disposition Plan: To be determined after ORIF next week     Consultants:  Orthopedics  Procedures:  External fixation right malleoli   Antibiotics:   Anti-infectives (From admission, onward)   Start     Dose/Rate Route Frequency Ordered Stop   06/05/18 1000  bictegravir-emtricitabine-tenofovir AF (BIKTARVY) 50-200-25 MG per tablet 1 tablet     1 tablet Oral Daily 06/04/18 1216     06/04/18 0000  bictegravir-emtricitabine-tenofovir AF (BIKTARVY) 50-200-25 MG TABS tablet     1 tablet Oral Daily 06/04/18 1217     06/02/18 1500  ceFAZolin (ANCEF) IVPB 2g/100 mL premix    Note to Pharmacy: Anesthesia to give preop   2 g 200 mL/hr over 30 Minutes Intravenous  Once 06/02/18 1451 06/02/18 1516   06/02/18 1452  ceFAZolin (ANCEF) 2-4 GM/100ML-% IVPB    Note to Pharmacy: Henrine Screws   : cabinet override      06/02/18 1452 06/02/18 1516   06/02/18 1000  elvitegravir-cobicistat-emtricitabine-tenofovir (GENVOYA) 150-150-200-10 MG tablet 1 tablet  Status:  Discontinued     1 tablet Oral Daily 06/02/18 0256 06/04/18 1216       Objective   Vitals:   06/12/18 0032 06/12/18  0432 06/12/18 0845 06/12/18 1415  BP: 133/67 130/71  134/60  Pulse: 74 74 83 72  Resp: 13 12 18 13   Temp:    98.2 F (36.8 C)  TempSrc:    Oral  SpO2: 96% 97% 98% 98%  Weight:      Height:        Intake/Output Summary (Last 24 hours) at 06/12/2018 1551 Last data filed at 06/12/2018 0400 Gross per 24 hour  Intake 600 ml  Output 500 ml  Net 100 ml   Filed Weights   06/01/18 2224  Weight: 106.1 kg     Physical Examination:  General-appears in no acute distress Heart-S1-S2, regular, no murmur auscultated Lungs-clear to auscultation bilaterally, no wheezing or crackles auscultated Abdomen-soft, nontender, no organomegaly Extremities-status post left BKA, right foot in ankle hardware Neuro-alert, oriented x3, no focal deficit noted   Data Reviewed: I have personally reviewed following labs and imaging studies   No results found for this or any previous visit (from the past 240 hour(s)).   Liver Function Tests: No results for input(s): AST, ALT, ALKPHOS, BILITOT, PROT, ALBUMIN in the last 168 hours. No results for input(s): LIPASE, AMYLASE in the last 168 hours. No results for input(s): AMMONIA in the last 168 hours.  Cardiac Enzymes: No results for input(s): CKTOTAL, CKMB, CKMBINDEX, TROPONINI in the last 168 hours. BNP (last 3 results) Recent Labs    08/29/17 1222 01/02/18 1703 01/05/18 0147  BNP 69.9 42.7 29.8    ProBNP (last 3 results) No results for input(s): PROBNP in the last 8760 hours.    Studies: No results found.  Scheduled Meds: . bictegravir-emtricitabine-tenofovir AF  1 tablet Oral Daily  . enoxaparin (LOVENOX) injection  40 mg Subcutaneous Q24H  . fluticasone  1 spray Each Nare Daily  . fluticasone furoate-vilanterol  1 puff Inhalation Daily   And  . umeclidinium bromide  1 puff Inhalation Daily  . insulin aspart  0-5 Units Subcutaneous QHS  . insulin aspart  0-9 Units Subcutaneous TID WC  . insulin aspart  5 Units Subcutaneous TID WC  .  insulin detemir  22 Units Subcutaneous QHS  . montelukast  10 mg Oral QHS  . pregabalin  225 mg Oral BID  . rosuvastatin  20 mg Oral Daily  . sertraline  200 mg Oral Daily    Admission status: Inpatient: Based on patients clinical presentation and evaluation of above clinical data, I have made determination that patient meets Inpatient criteria at this time.  Time spent: 25 min  [facility] Pager [phone]. If 7PM-7AM, please contact night-coverage at [email or web address], Office  [phone]  password [email or web address]  06/12/2018, 3:51 PM  LOS: 10 days

## 2018-06-12 NOTE — Progress Notes (Signed)
BIPAP ordered on prn basis, patient states she is in no respiratory distress at this time. WIll continue to monitor patient.

## 2018-06-12 NOTE — Progress Notes (Signed)
Inpatient Diabetes Program Recommendations  AACE/ADA: New Consensus Statement on Inpatient Glycemic Control (2015)  Target Ranges:  Prepandial:   less than 140 mg/dL      Peak postprandial:   less than 180 mg/dL (1-2 hours)      Critically ill patients:  140 - 180 mg/dL   Results for AMAREE, LOISEL (MRN [id number]) as of 06/12/2018 11:31  Ref. Range 06/10/2018 07:55 06/10/2018 11:52 06/10/2018 16:54 06/10/2018 21:23  Glucose-Capillary Latest Ref Range: 70 - 99 mg/dL 272 (H) 195 (H) 147 (H) 128 (H)   Results for JULIA, ALKHATIB (MRN [id number]) as of 06/12/2018 11:31  Ref. Range 06/11/2018 08:07 06/11/2018 12:00 06/11/2018 16:47 06/11/2018 21:41 06/12/2018 08:10  Glucose-Capillary Latest Ref Range: 70 - 99 mg/dL 228 (H) 173 (H) 143 (H) 193 (H) 265 (H)    Review of Glycemic Control  Diabetes history: DM 2, Last saw Dr Dwyane Dee, endocrinology on 05/30/2018 Outpatient Diabetes medications: Invokana 100 mg Daily, Humulin R U-500 45 units breakfast-30 units lunch-15 units supper, Toujeo 60 units bid  Current orders for Inpatient glycemic control:  Levemir 18 units daily at hs Novolog 0-9 units tid Novolog 0-5 units qhs Novolog 5 units tid  Inpatient Diabetes Program Recommendations:    Fasting glucose trends elevated in the 200 range. Please consider increasing Levemir to 22 units.  Will need close follow up with her Endocrinologist with insulin dosing. Would mirror inpatient dosing for outpatient and have her follow up with Endocrinology.  Thanks,  Tama Headings RN, MSN, BC-ADM Inpatient Diabetes Coordinator Team Pager [phone] (8a-5p)

## 2018-06-13 LAB — GLUCOSE, CAPILLARY
Glucose-Capillary: 253 mg/dL — ABNORMAL HIGH (ref 70–99)
Glucose-Capillary: 189 mg/dL — ABNORMAL HIGH (ref 70–99)
Glucose-Capillary: 169 mg/dL — ABNORMAL HIGH (ref 70–99)
Glucose-Capillary: 147 mg/dL — ABNORMAL HIGH (ref 70–99)

## 2018-06-13 MED ORDER — HYDRALAZINE HCL 25 MG PO TABS: 25.0000 mg | ORAL_TABLET | Freq: Four times a day (QID) | ORAL | Status: DC | PRN

## 2018-06-13 NOTE — Progress Notes (Signed)
[facility]  PROGRESS NOTE  [name] OZH:[id number] DOB: January 09, 1965 DOA: 06/01/2018 PCP: [name], PA-C   Brief HPI:   53 year old female with a history of severe COPD on home oxygen, HIV, diabetes mellitus type 2 status post left BKA came to hospital after she suffered mechanical fall at home.  Patient was found to have right bimalleolar fracture with dislocation.  Orthopedics was consulted.  She underwent external fixation on 06/03/2018.   Subjective   Patient seen and examined, denies any pain.   Assessment/Plan:     1. Right bimalleolar ankle fracture/dislocation-x-ray of the ankle showed acute comminuted distal fibular fracture with lateral angulation and displaced medial malleolar fracture.  Orthopedics was consulted, underwent external fixation on 06/03/2018.  Continue nonweightbearing status.  Tramadol/oxycodone PRN.  Plan to undergo ORIF once swelling improves.  Ortho following, likely ORIF on Monday.  2. Metabolic encephalopathy/acute hypoxic hypercapnic respiratory failure-patient has significant COPD, on home oxygen at 3L/min nasal cannula.  Following surgical intervention for right ankle fracture patient became hypoxic and encephalopathy due to respiratory failure.  Chest x-ray showed no acute cardiopulmonary findings.  COVID-19 test was negative.  Likely from obesity hypoventilation syndrome.  Patient will benefit from BiPAP/CPAP at night however she has been refusing.  3. Diabetes mellitus type 2-patient became hypoglycemic on 06/02/2018, likely from prolonged n.p.o. status for ankle fracture with insulin likely contributing to the event.  She was initially started on D10 and switched to D5 half-normal saline. Blood glucose is better controlled after change the dose of Levemir to 22 units subcu daily.Insulin aspart 5 units 3 times daily.  Continue sliding scale insulin with NovoLog.  4. CKD stage III-most recent creatinine 1.04, at baseline.  Avoid  nephrotoxins.    5. HIV-followed by ID Dr. Johnnye Sima as outpatient.  Continue HAART as tolerated.  6. COPD-stable, no wheezing at this time.  Continue bronchodilator as needed.    CBG: Recent Labs  Lab 06/12/18 0810 06/12/18 1142 06/12/18 1719 06/12/18 2102 06/13/18 0842  GLUCAP 265* 158* 171* 123* 253*    CBC: Recent Labs  Lab 06/11/18 0320 06/12/18 0450  WBC 13.7* 13.1*  HGB 10.6* 10.0*  HCT 34.4* 32.8*  MCV 91.0 90.9  PLT 282 629    Basic Metabolic Panel: Recent Labs  Lab 06/11/18 0320 06/12/18 0450  NA 135 134*  K 3.7 3.8  CL 94* 92*  CO2 31 29  GLUCOSE 205* 295*  BUN 22* 18  CREATININE 1.07* 1.07*  CALCIUM 8.9 8.8*     DVT prophylaxis: Lovenox  Code Status: Full code  Family Communication: No family at bedside  Disposition Plan: To be determined after ORIF next week     Consultants:  Orthopedics  Procedures:  External fixation right malleoli   Antibiotics:   Anti-infectives (From admission, onward)   Start     Dose/Rate Route Frequency Ordered Stop   06/05/18 1000  bictegravir-emtricitabine-tenofovir AF (BIKTARVY) 50-200-25 MG per tablet 1 tablet     1 tablet Oral Daily 06/04/18 1216     06/04/18 0000  bictegravir-emtricitabine-tenofovir AF (BIKTARVY) 50-200-25 MG TABS tablet     1 tablet Oral Daily 06/04/18 1217     06/02/18 1500  ceFAZolin (ANCEF) IVPB 2g/100 mL premix    Note to Pharmacy: Anesthesia to give preop   2 g 200 mL/hr over 30 Minutes Intravenous  Once 06/02/18 1451 06/02/18 1516   06/02/18 1452  ceFAZolin (ANCEF) 2-4 GM/100ML-% IVPB    Note to Pharmacy: Henrine Screws   :  cabinet override      06/02/18 1452 06/02/18 1516   06/02/18 1000  elvitegravir-cobicistat-emtricitabine-tenofovir (GENVOYA) 150-150-200-10 MG tablet 1 tablet  Status:  Discontinued     1 tablet Oral Daily 06/02/18 0256 06/04/18 1216       Objective   Vitals:   06/12/18 2337 06/13/18 0337 06/13/18 0438 06/13/18 0845  BP: (!) 117/48 (!) 128/59   134/64  Pulse: 82 72  81  Resp: 16 11  14   Temp:   98.2 F (36.8 C)   TempSrc:   Oral   SpO2: (!) 84% 98%  91%  Weight:      Height:        Intake/Output Summary (Last 24 hours) at 06/13/2018 1134 Last data filed at 06/13/2018 0900 Gross per 24 hour  Intake 260 ml  Output -  Net 260 ml   Filed Weights   06/01/18 2224  Weight: 106.1 kg     Physical Examination:  General-appears in no acute distress Heart-S1-S2, regular, no murmur auscultated Lungs-clear to auscultation bilaterally, no wheezing or crackles auscultated Abdomen-soft, nontender, no organomegaly Extremities-status post left BKA, right ankle hardware noted Neuro-alert, oriented x3, no focal deficit noted  Data Reviewed: I have personally reviewed following labs and imaging studies   No results found for this or any previous visit (from the past 240 hour(s)).   Liver Function Tests: No results for input(s): AST, ALT, ALKPHOS, BILITOT, PROT, ALBUMIN in the last 168 hours. No results for input(s): LIPASE, AMYLASE in the last 168 hours. No results for input(s): AMMONIA in the last 168 hours.  Cardiac Enzymes: No results for input(s): CKTOTAL, CKMB, CKMBINDEX, TROPONINI in the last 168 hours. BNP (last 3 results) Recent Labs    08/29/17 1222 01/02/18 1703 01/05/18 0147  BNP 69.9 42.7 29.8    ProBNP (last 3 results) No results for input(s): PROBNP in the last 8760 hours.    Studies: No results found.  Scheduled Meds: . bictegravir-emtricitabine-tenofovir AF  1 tablet Oral Daily  . enoxaparin (LOVENOX) injection  40 mg Subcutaneous Q24H  . fluticasone  1 spray Each Nare Daily  . fluticasone furoate-vilanterol  1 puff Inhalation Daily   And  . umeclidinium bromide  1 puff Inhalation Daily  . insulin aspart  0-5 Units Subcutaneous QHS  . insulin aspart  0-9 Units Subcutaneous TID WC  . insulin aspart  5 Units Subcutaneous TID WC  . insulin detemir  22 Units Subcutaneous QHS  . montelukast  10 mg  Oral QHS  . pregabalin  225 mg Oral BID  . rosuvastatin  20 mg Oral Daily  . sertraline  200 mg Oral Daily    Admission status: Inpatient: Based on patients clinical presentation and evaluation of above clinical data, I have made determination that patient meets Inpatient criteria at this time.  Time spent: 25 min  [facility] Pager [phone]. If 7PM-7AM, please contact night-coverage at [email or web address], Office  [phone]  password [facility]  06/13/2018, 11:34 AM  LOS: 11 days

## 2018-06-14 LAB — GLUCOSE, CAPILLARY
Glucose-Capillary: 233 mg/dL — ABNORMAL HIGH (ref 70–99)
Glucose-Capillary: 189 mg/dL — ABNORMAL HIGH (ref 70–99)
Glucose-Capillary: 312 mg/dL — ABNORMAL HIGH (ref 70–99)
Glucose-Capillary: 245 mg/dL — ABNORMAL HIGH (ref 70–99)

## 2018-06-14 MED ORDER — SODIUM CHLORIDE 0.9 % IV SOLN
INTRAVENOUS | Status: DC
  Administered 2018-06-14 – 2018-06-17 (×7): via INTRAVENOUS

## 2018-06-14 MED ORDER — SODIUM CHLORIDE 0.9 % IV BOLUS
250.0000 mL | Freq: Once | INTRAVENOUS | Status: AC
  Administered 2018-06-14: 250 mL via INTRAVENOUS

## 2018-06-14 NOTE — Progress Notes (Signed)
[facility]  PROGRESS NOTE  [name] DOB: [birth year] DOA: 06/01/2018 PCP: [name], PA-C   Brief HPI:   53 year old female with a history of severe COPD on home oxygen, HIV, diabetes mellitus type 2 status post left BKA came to hospital after she suffered mechanical fall at home.  Patient was found to have right bimalleolar fracture with dislocation.  Orthopedics was consulted.  She underwent external fixation on 06/03/2018.   Subjective   Patient seen and examined, denies foot pain.  No chest pain or shortness of breath.   Assessment/Plan:     1. Right bimalleolar ankle fracture/dislocation-x-ray of the ankle showed acute comminuted distal fibular fracture with lateral angulation and displaced medial malleolar fracture.  Orthopedics was consulted, underwent external fixation on 06/03/2018.  Continue nonweightbearing status.  Tramadol/oxycodone PRN.  Plan to undergo ORIF once swelling improves.  Ortho following, likely ORIF on Monday.  2. Metabolic encephalopathy/acute hypoxic hypercapnic respiratory failure-patient has significant COPD, on home oxygen at 3L/min nasal cannula.  Following surgical intervention for right ankle fracture patient became hypoxic and encephalopathy due to respiratory failure.  Chest x-ray showed no acute cardiopulmonary findings.  COVID-19 test was negative.  Likely from obesity hypoventilation syndrome.  Patient will benefit from BiPAP/CPAP at night however she has been refusing.  3. Diabetes mellitus type 2-patient became hypoglycemic on 06/02/2018, likely from prolonged n.p.o. status for ankle fracture with insulin likely contributing to the event.  She was initially started on D10 and switched to D5 half-normal saline.  Patient's blood glucose was elevated in 200s, dose of Levemir was changed to 22 units subcu daily, insulin aspart 5 units 3 times daily.  Continue sliding scale insulin with NovoLog.  4. CKD stage III-most recent  creatinine 1.04, at baseline.  Avoid nephrotoxins.  Follow BMP in a.m.  5. HIV-followed by ID Dr. Johnnye [name] as outpatient.  Continue HAART as tolerated.  6. COPD-stable, no wheezing at this time.  Continue bronchodilator as needed.    CBG: Recent Labs  Lab 06/13/18 0842 06/13/18 1240 06/13/18 1705 06/13/18 2125 06/14/18 0814  GLUCAP 253* 189* 169* 147* 233*    CBC: Recent Labs  Lab 06/11/18 0320 06/12/18 0450  WBC 13.7* 13.1*  HGB 10.6* 10.0*  HCT 34.4* 32.8*  MCV 91.0 90.9  PLT 282 258    Basic Metabolic Panel: Recent Labs  Lab 06/11/18 0320 06/12/18 0450  NA 135 134*  K 3.7 3.8  CL 94* 92*  CO2 31 29  GLUCOSE 205* 295*  BUN 22* 18  CREATININE 1.07* 1.07*  CALCIUM 8.9 8.8*     DVT prophylaxis: Lovenox  Code Status: Full code  Family Communication: No family at bedside  Disposition Plan: To be determined after ORIF next week     Consultants:  Orthopedics  Procedures:  External fixation right malleoli   Antibiotics:   Anti-infectives (From admission, onward)   Start     Dose/Rate Route Frequency Ordered Stop   06/05/18 1000  bictegravir-emtricitabine-tenofovir AF (BIKTARVY) 50-200-25 MG per tablet 1 tablet     1 tablet Oral Daily 06/04/18 1216     06/04/18 0000  bictegravir-emtricitabine-tenofovir AF (BIKTARVY) 50-200-25 MG TABS tablet     1 tablet Oral Daily 06/04/18 1217     06/02/18 1500  ceFAZolin (ANCEF) IVPB 2g/100 mL premix    Note to Pharmacy: Anesthesia to give preop   2 g 200 mL/hr over 30 Minutes Intravenous  Once 06/02/18 1451 06/02/18 1516   06/02/18 1452  ceFAZolin (ANCEF)  2-4 GM/100ML-% IVPB    Note to Pharmacy: Henrine Screws   : cabinet override      06/02/18 1452 06/02/18 1516   06/02/18 1000  elvitegravir-cobicistat-emtricitabine-tenofovir (GENVOYA) 150-150-200-10 MG tablet 1 tablet  Status:  Discontinued     1 tablet Oral Daily 06/02/18 0256 06/04/18 1216       Objective   Vitals:   06/13/18 1532 06/13/18 2147  06/13/18 2148 06/14/18 0903  BP: 110/60 (!) 84/53 96/71   Pulse:  90 91 76  Resp:  18  18  Temp:  98.6 F (37 C)    TempSrc:  Oral    SpO2:  96%  99%  Weight:      Height:        Intake/Output Summary (Last 24 hours) at 06/14/2018 1121 Last data filed at 06/14/2018 0900 Gross per 24 hour  Intake 242 ml  Output -  Net 242 ml   Filed Weights   06/01/18 2224  Weight: 106.1 kg     Physical Examination:  General-appears in no acute distress Heart-S1-S2, regular, no murmur auscultated Lungs-clear to auscultation bilaterally, no wheezing or crackles auscultated Abdomen-soft, nontender, no organomegaly Extremities-status post left BKA, right ankle in  hardware. Neuro-alert, oriented x3, no focal deficit noted   Data Reviewed: I have personally reviewed following labs and imaging studies   No results found for this or any previous visit (from the past 240 hour(s)).   Liver Function Tests: No results for input(s): AST, ALT, ALKPHOS, BILITOT, PROT, ALBUMIN in the last 168 hours. No results for input(s): LIPASE, AMYLASE in the last 168 hours. No results for input(s): AMMONIA in the last 168 hours.  Cardiac Enzymes: No results for input(s): CKTOTAL, CKMB, CKMBINDEX, TROPONINI in the last 168 hours. BNP (last 3 results) Recent Labs    08/29/17 1222 01/02/18 1703 01/05/18 0147  BNP 69.9 42.7 29.8    ProBNP (last 3 results) No results for input(s): PROBNP in the last 8760 hours.    Studies: No results found.  Scheduled Meds: . bictegravir-emtricitabine-tenofovir AF  1 tablet Oral Daily  . enoxaparin (LOVENOX) injection  40 mg Subcutaneous Q24H  . fluticasone  1 spray Each Nare Daily  . fluticasone furoate-vilanterol  1 puff Inhalation Daily   And  . umeclidinium bromide  1 puff Inhalation Daily  . insulin aspart  0-5 Units Subcutaneous QHS  . insulin aspart  0-9 Units Subcutaneous TID WC  . insulin aspart  5 Units Subcutaneous TID WC  . insulin detemir  22  Units Subcutaneous QHS  . montelukast  10 mg Oral QHS  . pregabalin  225 mg Oral BID  . rosuvastatin  20 mg Oral Daily  . sertraline  200 mg Oral Daily    Admission status: Inpatient: Based on patients clinical presentation and evaluation of above clinical data, I have made determination that patient meets Inpatient criteria at this time.  Time spent: 25 min  [facility] Pager [phone]. If 7PM-7AM, please contact night-coverage at [email or web address], Office  [phone]  password [email or web address]  06/14/2018, 11:21 AM  LOS: 12 days

## 2018-06-14 NOTE — Progress Notes (Signed)
Pts BP 88/53, pt asymptomatic , Dr. Darrick Meigs notified , order placed for 250 normal saline bolus, 250 bolus given, repeat BP 81/58 pt asymptomatic, Dr Darrick Meigs notified and verbal order to give anther 250 normal saline bolus, and to start pt at NS at 100 ml/hr after bolus is complete , will continue to monitor

## 2018-06-15 ENCOUNTER — Inpatient Hospital Stay (HOSPITAL_COMMUNITY): Payer: Medicare HMO

## 2018-06-15 DIAGNOSIS — I959 Hypotension, unspecified: Secondary | ICD-10-CM

## 2018-06-15 LAB — CBC
HCT: 29.3 % — ABNORMAL LOW (ref 36.0–46.0)
Hemoglobin: 8.9 g/dL — ABNORMAL LOW (ref 12.0–15.0)
MCH: 27.7 pg (ref 26.0–34.0)
MCHC: 30.4 g/dL (ref 30.0–36.0)
MCV: 91.3 fL (ref 80.0–100.0)
Platelets: 346 10*3/uL (ref 150–400)
RBC: 3.21 MIL/uL — ABNORMAL LOW (ref 3.87–5.11)
RDW: 16.6 % — ABNORMAL HIGH (ref 11.5–15.5)
WBC: 12.1 10*3/uL — ABNORMAL HIGH (ref 4.0–10.5)
nRBC: 0.2 % (ref 0.0–0.2)

## 2018-06-15 LAB — COMPREHENSIVE METABOLIC PANEL
ALT: 18 U/L (ref 0–44)
AST: 15 U/L (ref 15–41)
Albumin: 2 g/dL — ABNORMAL LOW (ref 3.5–5.0)
Alkaline Phosphatase: 67 U/L (ref 38–126)
Anion gap: 11 (ref 5–15)
BUN: 11 mg/dL (ref 6–20)
CO2: 27 mmol/L (ref 22–32)
Calcium: 8.2 mg/dL — ABNORMAL LOW (ref 8.9–10.3)
Chloride: 94 mmol/L — ABNORMAL LOW (ref 98–111)
Creatinine, Ser: 0.97 mg/dL (ref 0.44–1.00)
GFR calc Af Amer: >60 mL/min (ref >60)
GFR calc non Af Amer: >60 mL/min (ref >60)
Glucose, Bld: 308 mg/dL — ABNORMAL HIGH (ref 70–99)
Potassium: 3.4 mmol/L — ABNORMAL LOW (ref 3.5–5.1)
Sodium: 132 mmol/L — ABNORMAL LOW (ref 135–145)
Total Bilirubin: 0.6 mg/dL (ref 0.3–1.2)
Total Protein: 5.2 g/dL — ABNORMAL LOW (ref 6.5–8.1)

## 2018-06-15 LAB — GLUCOSE, CAPILLARY
Glucose-Capillary: 293 mg/dL — ABNORMAL HIGH (ref 70–99)
Glucose-Capillary: 261 mg/dL — ABNORMAL HIGH (ref 70–99)
Glucose-Capillary: 245 mg/dL — ABNORMAL HIGH (ref 70–99)
Glucose-Capillary: 337 mg/dL — ABNORMAL HIGH (ref 70–99)

## 2018-06-15 MED ORDER — POTASSIUM CHLORIDE CRYS ER 20 MEQ PO TBCR
20.0000 meq | EXTENDED_RELEASE_TABLET | Freq: Once | ORAL | Status: AC
  Administered 2018-06-15: 20 meq via ORAL
  Filled 2018-06-15: qty 1

## 2018-06-15 MED ORDER — INSULIN DETEMIR 100 UNIT/ML ~~LOC~~ SOLN
25.0000 [IU] | Freq: Every day | SUBCUTANEOUS | Status: DC
  Administered 2018-06-15 – 2018-06-16 (×2): 25 [IU] via SUBCUTANEOUS
  Filled 2018-06-15 (×3): qty 0.25

## 2018-06-15 NOTE — Progress Notes (Signed)
[facility]  PROGRESS NOTE  [name] MVE:[id number] DOB: 1965-10-07 DOA: 06/01/2018 PCP: [name], PA-C   Brief HPI:   53 year old female with a history of severe COPD on home oxygen, HIV, diabetes mellitus type 2 status post left BKA came to hospital after she suffered mechanical fall at home.  Patient was found to have right bimalleolar fracture with dislocation.  Orthopedics was consulted.  She underwent external fixation on 06/03/2018.   Subjective   Patient seen and examined, became hypotensive yesterday.  No signs and symptoms of infection.  Blood pressure improved after started on IV fluids.  WBC obtained today is 12.1, creatinine 0.97, BUN 11.   Assessment/Plan:     1. Right bimalleolar ankle fracture/dislocation-x-ray of the ankle showed acute comminuted distal fibular fracture with lateral angulation and displaced medial malleolar fracture.  Orthopedics was consulted, underwent external fixation on 06/03/2018.  Continue nonweightbearing status.  Tramadol/oxycodone PRN.  Plan to undergo ORIF once swelling improves.  Ortho following, likely ORIF on Monday.  2. Hypotension-resolved, unclear etiology.  Patient was hypotensive yesterday, given normal saline bolus 250 cc x 2.  Started on normal saline at 100 mL/h.  Blood pressure is stable at this time. No signs or symptoms of infection.  Afebrile.  Normal WBC.  We will continue to monitor.  3. Metabolic encephalopathy/acute hypoxic hypercapnic respiratory failure-resolved, patient back to baseline.  Patient has significant COPD, on home oxygen at 3L/min nasal cannula.  Following surgical intervention for right ankle fracture patient became hypoxic and encephalopathy due to respiratory failure.  Chest x-ray showed no acute cardiopulmonary findings.  COVID-19 test was negative.  Likely from obesity hypoventilation syndrome.  Patient will benefit from BiPAP/CPAP at night however she has been refusing.  4. Diabetes  mellitus type 2-patient became hypoglycemic on 06/02/2018, likely from prolonged n.p.o. status for ankle fracture with insulin likely contributing to the event.  She was initially started on D10 and switched to D5 half-normal saline.  Patient's blood glucose is still elevated in 200s, will change Levemir to 25 units subcu daily.  Insulin aspart 5 units 3 times daily.  Continue sliding scale insulin with NovoLog.    5. CKD stage III-most recent creatinine 1.04, at baseline.  Avoid nephrotoxins. Follow BMP in a.m.  6. HIV-followed by ID Dr. Johnnye Sima as outpatient.  Continue HAART as tolerated.  7. COPD-stable, no wheezing at this time.  Continue bronchodilator as needed.  8. Hypokalemia-potassium is 3.4, will replace potassium and check BMP in a.m.    CBG: Recent Labs  Lab 06/14/18 0814 06/14/18 1156 06/14/18 1718 06/14/18 2222 06/15/18 0754  GLUCAP 233* 189* 312* 245* 293*    CBC: Recent Labs  Lab 06/11/18 0320 06/12/18 0450 06/15/18 0858  WBC 13.7* 13.1* 12.1*  HGB 10.6* 10.0* 8.9*  HCT 34.4* 32.8* 29.3*  MCV 91.0 90.9 91.3  PLT 282 292 283    Basic Metabolic Panel: Recent Labs  Lab 06/11/18 0320 06/12/18 0450 06/15/18 0858  NA 135 134* 132*  K 3.7 3.8 3.4*  CL 94* 92* 94*  CO2 31 29 27   GLUCOSE 205* 295* 308*  BUN 22* 18 11  CREATININE 1.07* 1.07* 0.97  CALCIUM 8.9 8.8* 8.2*     DVT prophylaxis: Lovenox  Code Status: Full code  Family Communication: No family at bedside  Disposition Plan: To be determined after ORIF next week     Consultants:  Orthopedics  Procedures:  External fixation right malleoli   Antibiotics:   Anti-infectives (From admission, onward)  Start     Dose/Rate Route Frequency Ordered Stop   06/05/18 1000  bictegravir-emtricitabine-tenofovir AF (BIKTARVY) 50-200-25 MG per tablet 1 tablet     1 tablet Oral Daily 06/04/18 1216     06/04/18 0000  bictegravir-emtricitabine-tenofovir AF (BIKTARVY) 50-200-25 MG TABS tablet      1 tablet Oral Daily 06/04/18 1217     06/02/18 1500  ceFAZolin (ANCEF) IVPB 2g/100 mL premix    Note to Pharmacy: Anesthesia to give preop   2 g 200 mL/hr over 30 Minutes Intravenous  Once 06/02/18 1451 06/02/18 1516   06/02/18 1452  ceFAZolin (ANCEF) 2-4 GM/100ML-% IVPB    Note to Pharmacy: Henrine Screws   : cabinet override      06/02/18 1452 06/02/18 1516   06/02/18 1000  elvitegravir-cobicistat-emtricitabine-tenofovir (GENVOYA) 150-150-200-10 MG tablet 1 tablet  Status:  Discontinued     1 tablet Oral Daily 06/02/18 0256 06/04/18 1216       Objective   Vitals:   06/14/18 2227 06/15/18 0427 06/15/18 0822 06/15/18 0824  BP: (!) 100/56 130/68    Pulse: 70 84    Resp:  16    Temp:  98.3 F (36.8 C)    TempSrc:  Oral    SpO2:  98% 98% 98%  Weight:      Height:        Intake/Output Summary (Last 24 hours) at 06/15/2018 1102 Last data filed at 06/15/2018 0321 Gross per 24 hour  Intake 911.8 ml  Output -  Net 911.8 ml   Filed Weights   06/01/18 2224  Weight: 106.1 kg     Physical Examination:  General-appears in no acute distress Heart-S1-S2, regular, no murmur auscultated Lungs-clear to auscultation bilaterally, no wheezing or crackles auscultated Abdomen-soft, nontender, no organomegaly Extremities-status post left BKA, right ankle in hardware. Neuro-alert, oriented x3, no focal deficit noted   Liver Function Tests: Recent Labs  Lab 06/15/18 0858  AST 15  ALT 18  ALKPHOS 67  BILITOT 0.6  PROT 5.2*  ALBUMIN 2.0*    BNP (last 3 results) Recent Labs    08/29/17 1222 01/02/18 1703 01/05/18 0147  BNP 69.9 42.7 29.8      Studies: Dg Chest Port 1 View  Result Date: 06/15/2018 CLINICAL DATA:  Cough. EXAM: PORTABLE CHEST 1 VIEW COMPARISON:  06/02/2018 FINDINGS: Stable cardiomegaly. Left jugular central venous catheter remains in appropriate position. Both lungs are clear. Several old bilateral rib fracture deformities again noted. IMPRESSION: Stable  cardiomegaly. No active lung disease. Electronically Signed   By: Earle Gell M.D.   On: 06/15/2018 10:07    Scheduled Meds: . bictegravir-emtricitabine-tenofovir AF  1 tablet Oral Daily  . enoxaparin (LOVENOX) injection  40 mg Subcutaneous Q24H  . fluticasone  1 spray Each Nare Daily  . fluticasone furoate-vilanterol  1 puff Inhalation Daily   And  . umeclidinium bromide  1 puff Inhalation Daily  . insulin aspart  0-5 Units Subcutaneous QHS  . insulin aspart  0-9 Units Subcutaneous TID WC  . insulin aspart  5 Units Subcutaneous TID WC  . insulin detemir  25 Units Subcutaneous QHS  . montelukast  10 mg Oral QHS  . pregabalin  225 mg Oral BID  . rosuvastatin  20 mg Oral Daily  . sertraline  200 mg Oral Daily    Admission status: Inpatient: Based on patients clinical presentation and evaluation of above clinical data, I have made determination that patient meets Inpatient criteria at this time.  Time spent: 25  min  [facility] Pager [phone]. If 7PM-7AM, please contact night-coverage at [email or web address], Office  [phone]  password [email or web address]  06/15/2018, 11:02 AM  LOS: 13 days

## 2018-06-16 ENCOUNTER — Inpatient Hospital Stay (HOSPITAL_COMMUNITY): Payer: Medicare HMO

## 2018-06-16 LAB — GLUCOSE, CAPILLARY
Glucose-Capillary: 255 mg/dL — ABNORMAL HIGH (ref 70–99)
Glucose-Capillary: 202 mg/dL — ABNORMAL HIGH (ref 70–99)
Glucose-Capillary: 279 mg/dL — ABNORMAL HIGH (ref 70–99)
Glucose-Capillary: 263 mg/dL — ABNORMAL HIGH (ref 70–99)

## 2018-06-16 LAB — BASIC METABOLIC PANEL
Anion gap: 9 (ref 5–15)
BUN: 8 mg/dL (ref 6–20)
CO2: 27 mmol/L (ref 22–32)
Calcium: 8.1 mg/dL — ABNORMAL LOW (ref 8.9–10.3)
Chloride: 103 mmol/L (ref 98–111)
Creatinine, Ser: 0.98 mg/dL (ref 0.44–1.00)
GFR calc Af Amer: >60 mL/min (ref >60)
GFR calc non Af Amer: >60 mL/min (ref >60)
Glucose, Bld: 296 mg/dL — ABNORMAL HIGH (ref 70–99)
Potassium: 3.5 mmol/L (ref 3.5–5.1)
Sodium: 139 mmol/L (ref 135–145)

## 2018-06-16 NOTE — Progress Notes (Signed)
[facility]  PROGRESS NOTE  [name] [id number]:[id number] DOB: 1965-06-07 DOA: 06/01/2018 PCP: [name], PA-C   Brief HPI:   53 year old female with a history of severe COPD on home oxygen, HIV, diabetes mellitus type 2 status post left BKA came to hospital after she suffered mechanical fall at home.  Patient was found to have right bimalleolar fracture with dislocation.  Orthopedics was consulted.  She underwent external fixation on 06/03/2018.   Subjective   Patient seen and examined, denies any complaints.  Awaiting orthopedics evaluation for possible ORIF today.   Assessment/Plan:     1. Right bimalleolar ankle fracture/dislocation-x-ray of the ankle showed acute comminuted distal fibular fracture with lateral angulation and displaced medial malleolar fracture.  Orthopedics was consulted, underwent external fixation on 06/03/2018.  Continue nonweightbearing status.  Tramadol/oxycodone PRN.  Plan to undergo ORIF once swelling improves.  Ortho following, likely ORIF after orthopedics evaluation today.  2. Hypotension-resolved, unclear etiology.  Patient was hypotensive yesterday, given normal saline bolus 250 cc x 2.  Started on normal saline at 100 mL/h.  Blood pressure is stable at this time. No signs or symptoms of infection.  Afebrile.  Normal WBC.  We will continue to monitor.  3. Metabolic encephalopathy/acute hypoxic hypercapnic respiratory failure-resolved, patient back to baseline.  Patient has significant COPD, on home oxygen at 3L/min nasal cannula.  Following surgical intervention for right ankle fracture patient became hypoxic and encephalopathy due to respiratory failure.  Chest x-ray showed no acute cardiopulmonary findings.  COVID-19 test was negative.  Likely from obesity hypoventilation syndrome. Patient will benefit from BiPAP/CPAP at night however she has been refusing.  4. Diabetes mellitus type 2-patient became hypoglycemic on 06/02/2018, likely from  prolonged n.p.o. status for ankle fracture with insulin likely contributing to the event.  She was initially started on D10 and switched to D5 half-normal saline.  Patient's blood glucose was still elevated in 200s Levemir dose was changed to 25 units subcu daily.   Insulin aspart 5 units 3 times daily.  Continue sliding scale insulin with NovoLog.    5. CKD stage III-most recent creatinine 1.04, at baseline.  Avoid nephrotoxins. Follow BMP in a.m.  6. HIV-followed by ID Dr. Johnnye Sima as outpatient.  Continue HAART as tolerated.  7. COPD-stable, no wheezing at this time.  Continue bronchodilator as needed.  8. Hypokalemia-potassium was 3.4 yesterday.  Replaced, will repeat BMP today.    CBG: Recent Labs  Lab 06/15/18 0754 06/15/18 1148 06/15/18 1654 06/15/18 2236 06/16/18 0806  GLUCAP 293* 261* 245* 337* 255*    CBC: Recent Labs  Lab 06/11/18 0320 06/12/18 0450 06/15/18 0858  WBC 13.7* 13.1* 12.1*  HGB 10.6* 10.0* 8.9*  HCT 34.4* 32.8* 29.3*  MCV 91.0 90.9 91.3  PLT 282 292 106    Basic Metabolic Panel: Recent Labs  Lab 06/11/18 0320 06/12/18 0450 06/15/18 0858  NA 135 134* 132*  K 3.7 3.8 3.4*  CL 94* 92* 94*  CO2 31 29 27   GLUCOSE 205* 295* 308*  BUN 22* 18 11  CREATININE 1.07* 1.07* 0.97  CALCIUM 8.9 8.8* 8.2*     DVT prophylaxis: Lovenox  Code Status: Full code  Family Communication: No family at bedside  Disposition Plan: To be determined after ORIF next week     Consultants:  Orthopedics  Procedures:  External fixation right malleoli   Antibiotics:   Anti-infectives (From admission, onward)   Start     Dose/Rate Route Frequency Ordered Stop   06/05/18 1000  bictegravir-emtricitabine-tenofovir AF (BIKTARVY) 50-200-25 MG per tablet 1 tablet     1 tablet Oral Daily 06/04/18 1216     06/04/18 0000  bictegravir-emtricitabine-tenofovir AF (BIKTARVY) 50-200-25 MG TABS tablet     1 tablet Oral Daily 06/04/18 1217     06/02/18 1500   ceFAZolin (ANCEF) IVPB 2g/100 mL premix    Note to Pharmacy: Anesthesia to give preop   2 g 200 mL/hr over 30 Minutes Intravenous  Once 06/02/18 1451 06/02/18 1516   06/02/18 1452  ceFAZolin (ANCEF) 2-4 GM/100ML-% IVPB    Note to Pharmacy: Henrine Screws   : cabinet override      06/02/18 1452 06/02/18 1516   06/02/18 1000  elvitegravir-cobicistat-emtricitabine-tenofovir (GENVOYA) 150-150-200-10 MG tablet 1 tablet  Status:  Discontinued     1 tablet Oral Daily 06/02/18 0256 06/04/18 1216       Objective   Vitals:   06/15/18 2235 06/16/18 0553 06/16/18 0819 06/16/18 0821  BP: (!) 107/54 125/62    Pulse: 77 80    Resp:      Temp: 98.5 F (36.9 C) 99.2 F (37.3 C)    TempSrc: Oral Oral    SpO2: 99% 99% 94% 94%  Weight:      Height:        Intake/Output Summary (Last 24 hours) at 06/16/2018 1144 Last data filed at 06/16/2018 0556 Gross per 24 hour  Intake -  Output 800 ml  Net -800 ml   Filed Weights   06/01/18 2224  Weight: 106.1 kg     Physical Examination:  General-appears in no acute distress Heart-S1-S2, regular, no murmur auscultated Lungs-clear to auscultation bilaterally, no wheezing or crackles auscultated Abdomen-soft, nontender, no organomegaly Extremities-status post left BKA, right ankle in  hardware. Neuro-alert, oriented x3, no focal deficit noted  Liver Function Tests: Recent Labs  Lab 06/15/18 0858  AST 15  ALT 18  ALKPHOS 67  BILITOT 0.6  PROT 5.2*  ALBUMIN 2.0*    BNP (last 3 results) Recent Labs    08/29/17 1222 01/02/18 1703 01/05/18 0147  BNP 69.9 42.7 29.8      Studies: Dg Chest Port 1 View  Result Date: 06/15/2018 CLINICAL DATA:  Cough. EXAM: PORTABLE CHEST 1 VIEW COMPARISON:  06/02/2018 FINDINGS: Stable cardiomegaly. Left jugular central venous catheter remains in appropriate position. Both lungs are clear. Several old bilateral rib fracture deformities again noted. IMPRESSION: Stable cardiomegaly. No active lung disease.  Electronically Signed   By: Earle Gell M.D.   On: 06/15/2018 10:07    Scheduled Meds: . bictegravir-emtricitabine-tenofovir AF  1 tablet Oral Daily  . enoxaparin (LOVENOX) injection  40 mg Subcutaneous Q24H  . fluticasone  1 spray Each Nare Daily  . fluticasone furoate-vilanterol  1 puff Inhalation Daily   And  . umeclidinium bromide  1 puff Inhalation Daily  . insulin aspart  0-5 Units Subcutaneous QHS  . insulin aspart  0-9 Units Subcutaneous TID WC  . insulin aspart  5 Units Subcutaneous TID WC  . insulin detemir  25 Units Subcutaneous QHS  . montelukast  10 mg Oral QHS  . pregabalin  225 mg Oral BID  . rosuvastatin  20 mg Oral Daily  . sertraline  200 mg Oral Daily    Admission status: Inpatient: Based on patients clinical presentation and evaluation of above clinical data, I have made determination that patient meets Inpatient criteria at this time.  Time spent: 25 min  [facility] Pager [phone]. If  7PM-7AM, please contact night-coverage at [email or web address], Office  [phone]  password [email or web address]  06/16/2018, 11:44 AM  LOS: 14 days

## 2018-06-16 NOTE — Progress Notes (Signed)
I evaluated [name]'s ankle today.  She continues to have significant swelling in the ankle as well as eschar on the medial side.  Overall the skin does not wrinkle.  From my standpoint I feel that the soft tissues have not improved to the point that I feel that it would be safe to perform ORIF therefore we will plan to treat her ankle fracture definitively in the external fixator.  We will repeat the x-rays today to determine if she needs an adjustment of the external fixator in the OR on Wednesday.  Going forward she will continue to the nonweightbearing.  She will need pin site care twice a day.  Due to the fact that she lives alone I do not feel that she would be safe to go home.  She will likely need to be in a SNF.  [name] however is reluctant to go to an SNF.  I think the best thing is to have PT do a home safety evaluation.

## 2018-06-16 NOTE — Progress Notes (Signed)
Repeat ankle xrays obtained today show that the fractures are still in stable and acceptable alignment within the ex fix.  Given the lack of significant improvement in her soft tissues and skin, we will plan to treat definitively with ex fix.

## 2018-06-16 NOTE — Progress Notes (Signed)
Inpatient Diabetes Program Recommendations  AACE/ADA: New Consensus Statement on Inpatient Glycemic Control (2015)  Target Ranges:  Prepandial:   less than 140 mg/dL      Peak postprandial:   less than 180 mg/dL (1-2 hours)      Critically ill patients:  140 - 180 mg/dL   Results for [name], [name] (MRN [id number]) as of 06/16/2018 12:26  Ref. Range 06/15/2018 07:54 06/15/2018 11:48 06/15/2018 16:54 06/15/2018 22:36 06/16/2018 08:06  Glucose-Capillary Latest Ref Range: 70 - 99 mg/dL 293 (H) 261 (H) 245 (H) 337 (H) 255 (H)    Review of Glycemic Control  Diabetes history: DM 2, Last saw Dr Dwyane Dee, endocrinology on 05/30/2018 Outpatient Diabetes medications: Invokana 100 mg Daily, Humulin R U-500 45 units breakfast-30 units lunch-15 units supper, Toujeo 60 units bid  Current orders for Inpatient glycemic control:  Levemir 25 units daily at hs Novolog 0-9 units tid Novolog 0-5 units qhs Novolog 5 units tid  Inpatient Diabetes Program Recommendations:    Glucose trends increased over the past 24/48 hours. Noted IV Fluids changed decreasing dextrose amount.   -  Consider Levemir 30 units   -  Consider increasing Novolog meal coverage to 8 units tid if patient is consuming at least 50% of meals  Will need close follow up with her Endocrinologist with insulin dosing. Would mirror inpatient dosing for outpatient and have her follow up with Endocrinology.  Thanks,  Tama Headings RN, MSN, BC-ADM Inpatient Diabetes Coordinator Team Pager [phone] (8a-5p)

## 2018-06-17 DIAGNOSIS — E162 Hypoglycemia, unspecified: Secondary | ICD-10-CM

## 2018-06-17 LAB — GLUCOSE, CAPILLARY
Glucose-Capillary: 257 mg/dL — ABNORMAL HIGH (ref 70–99)
Glucose-Capillary: 283 mg/dL — ABNORMAL HIGH (ref 70–99)
Glucose-Capillary: 244 mg/dL — ABNORMAL HIGH (ref 70–99)
Glucose-Capillary: 273 mg/dL — ABNORMAL HIGH (ref 70–99)

## 2018-06-17 MED ORDER — INSULIN DETEMIR 100 UNIT/ML ~~LOC~~ SOLN
30.0000 [IU] | Freq: Every day | SUBCUTANEOUS | Status: DC
  Administered 2018-06-17: 30 [IU] via SUBCUTANEOUS
  Filled 2018-06-17 (×2): qty 0.3

## 2018-06-17 NOTE — Progress Notes (Signed)
[facility]  PROGRESS NOTE  [name] YWV:[id number] DOB: 01/26/1965 DOA: 06/01/2018 PCP: [name], PA-C   Brief HPI:   53 year old female with a history of severe COPD on home oxygen, HIV, diabetes mellitus type 2 status post left BKA came to hospital after she suffered mechanical fall at home.  Patient was found to have right bimalleolar fracture with dislocation.  Orthopedics was consulted.  She underwent external fixation on 06/03/2018.   Subjective   Patient seen and examined, denies chest pain or shortness of breath.  Orthopedics has canceled ORIF.  Patient skin of the foot does not wrinkle and is not safe to performed ORIF.  Orthopedics plan to treat ankle fracture definitely in the external fixator.   Assessment/Plan:     1. Right bimalleolar ankle fracture/dislocation-x-ray of the ankle showed acute comminuted distal fibular fracture with lateral angulation and displaced medial malleolar fracture.  Orthopedics was consulted, underwent external fixation on 06/03/2018.  Continue nonweightbearing status.  Tramadol/oxycodone PRN.  Orthopedics has dropped the idea of ORIF as patient's soft tissue is still swollen and not safe to perform ORIF.  Continue external fixator.  2. Hypotension-resolved, unclear etiology.  Patient became hypotensive in the hospital, was given normal saline bolus 250 cc x 2.  Started on normal saline at 100 mL/h.  Blood pressure is stable at this time. No signs or symptoms of infection.  Afebrile.  Normal WBC.  We will continue to monitor.  3. Metabolic encephalopathy/acute hypoxic hypercapnic respiratory failure-resolved, patient back to baseline.  Patient has significant COPD, on home oxygen at 3L/min nasal cannula.  Following surgical intervention for right ankle fracture patient became hypoxic and encephalopathy due to respiratory failure.  Chest x-ray showed no acute cardiopulmonary findings.  COVID-19 test was negative.  Likely from obesity  hypoventilation syndrome. Patient will benefit from BiPAP/CPAP at night however she has been refusing.  4. Diabetes mellitus type 2-patient became hypoglycemic on 06/02/2018, likely from prolonged n.p.o. status for ankle fracture with insulin likely contributing to the event.  She was initially started on D10 and switched to D5 half-normal saline.  D5 half-normal saline has been changed to normal saline at 100/h.  Patient's blood glucose is still elevated in 200s Levemir dose was changed to 25 units subcu daily.  Will increase Levemir to 30 units subcu daily.  Insulin aspart 5 units 3 times daily.  Continue sliding scale insulin with NovoLog.    5. CKD stage III-most recent creatinine 1.04, at baseline.  Avoid nephrotoxins. Follow BMP in a.m.  6. HIV-followed by ID Dr. Johnnye Sima as outpatient.  Continue HAART as tolerated.  7. COPD-stable, no wheezing at this time.  Continue bronchodilator as needed.  8. Hypokalemia--- replete    CBG: Recent Labs  Lab 06/16/18 1251 06/16/18 1751 06/16/18 2101 06/17/18 0747 06/17/18 1158  GLUCAP 202* 279* 263* 257* 283*    CBC: Recent Labs  Lab 06/11/18 0320 06/12/18 0450 06/15/18 0858  WBC 13.7* 13.1* 12.1*  HGB 10.6* 10.0* 8.9*  HCT 34.4* 32.8* 29.3*  MCV 91.0 90.9 91.3  PLT 282 292 854    Basic Metabolic Panel: Recent Labs  Lab 06/11/18 0320 06/12/18 0450 06/15/18 0858 06/16/18 1557  NA 135 134* 132* 139  K 3.7 3.8 3.4* 3.5  CL 94* 92* 94* 103  CO2 31 29 27 27   GLUCOSE 205* 295* 308* 296*  BUN 22* 18 11 8   CREATININE 1.07* 1.07* 0.97 0.98  CALCIUM 8.9 8.8* 8.2* 8.1*     DVT prophylaxis:  Lovenox  Code Status: Full code  Family Communication: No family at bedside  Disposition Plan: To be determined after ORIF next week     Consultants:  Orthopedics  Procedures:  External fixation right malleoli   Antibiotics:   Anti-infectives (From admission, onward)   Start     Dose/Rate Route Frequency Ordered Stop    06/05/18 1000  bictegravir-emtricitabine-tenofovir AF (BIKTARVY) 50-200-25 MG per tablet 1 tablet     1 tablet Oral Daily 06/04/18 1216     06/04/18 0000  bictegravir-emtricitabine-tenofovir AF (BIKTARVY) 50-200-25 MG TABS tablet     1 tablet Oral Daily 06/04/18 1217     06/02/18 1500  ceFAZolin (ANCEF) IVPB 2g/100 mL premix    Note to Pharmacy: Anesthesia to give preop   2 g 200 mL/hr over 30 Minutes Intravenous  Once 06/02/18 1451 06/02/18 1516   06/02/18 1452  ceFAZolin (ANCEF) 2-4 GM/100ML-% IVPB    Note to Pharmacy: Henrine Screws   : cabinet override      06/02/18 1452 06/02/18 1516   06/02/18 1000  elvitegravir-cobicistat-emtricitabine-tenofovir (GENVOYA) 150-150-200-10 MG tablet 1 tablet  Status:  Discontinued     1 tablet Oral Daily 06/02/18 0256 06/04/18 1216       Objective   Vitals:   06/16/18 2115 06/17/18 0549 06/17/18 0620 06/17/18 0752  BP: 113/61 (!) 85/74 (!) 105/51   Pulse:  77    Resp:  18    Temp:  98 F (36.7 C)    TempSrc:  Oral    SpO2:  100%  97%  Weight:      Height:        Intake/Output Summary (Last 24 hours) at 06/17/2018 1459 Last data filed at 06/17/2018 0900 Gross per 24 hour  Intake 1890 ml  Output -  Net 1890 ml   Filed Weights   06/01/18 2224  Weight: 106.1 kg     Physical Examination:  General-appears in no acute distress Heart-S1-S2, regular, no murmur auscultated Lungs-clear to auscultation bilaterally, no wheezing or crackles auscultated Abdomen-soft, nontender, no organomegaly Extremities-status post left BKA, right ankle in external fixator. Neuro-alert, oriented x3, no focal deficit noted  Liver Function Tests: Recent Labs  Lab 06/15/18 0858  AST 15  ALT 18  ALKPHOS 67  BILITOT 0.6  PROT 5.2*  ALBUMIN 2.0*    BNP (last 3 results) Recent Labs    08/29/17 1222 01/02/18 1703 01/05/18 0147  BNP 69.9 42.7 29.8      Studies: Dg Ankle Complete Right  Result Date: 06/16/2018 CLINICAL DATA:  Pt c/o right  ankle fracture. Hx of ex-fix placement and previous right ankle fracture. EXAM: RIGHT ANKLE - COMPLETE 3+ VIEW COMPARISON:  Plain film of the RIGHT ankle dated 06/01/2018. FINDINGS: External fixation hardware now in place, traversing the RIGHT ankle joint. Again noted are displaced fractures of the medial and lateral malleoli. Osseous alignment is significantly improved compared to the earlier plain film examination. Ankle mortise is now symmetric. IMPRESSION: External fixation hardware now in place traversing the RIGHT ankle joint. Osseous alignment is significantly improved compared to the earlier plain film examination. Electronically Signed   By: Franki Cabot M.D.   On: 06/16/2018 13:30    Scheduled Meds: . bictegravir-emtricitabine-tenofovir AF  1 tablet Oral Daily  . enoxaparin (LOVENOX) injection  40 mg Subcutaneous Q24H  . fluticasone  1 spray Each Nare Daily  . fluticasone furoate-vilanterol  1 puff Inhalation Daily   And  . umeclidinium bromide  1 puff Inhalation Daily  .  insulin aspart  0-5 Units Subcutaneous QHS  . insulin aspart  0-9 Units Subcutaneous TID WC  . insulin aspart  5 Units Subcutaneous TID WC  . insulin detemir  30 Units Subcutaneous QHS  . montelukast  10 mg Oral QHS  . pregabalin  225 mg Oral BID  . rosuvastatin  20 mg Oral Daily  . sertraline  200 mg Oral Daily    Admission status: Inpatient: Based on patients clinical presentation and evaluation of above clinical data, I have made determination that patient meets Inpatient criteria at this time.  Time spent: 25 min  [facility] Pager [phone]. If 7PM-7AM, please contact night-coverage at [email or web address], Office  [phone]  password [email or web address]  06/17/2018, 2:59 PM  LOS: 15 days

## 2018-06-17 NOTE — NC FL2 (Signed)
[place] MEDICAID FL2 LEVEL OF CARE SCREENING TOOL     IDENTIFICATION  Patient Name: [name] Birthdate: [birth year] Sex: female Admission Date (Current Location): 06/01/2018  [facility] and [place] Number:  Herbalist and Address:  [facility], [place] [address], [place], [place] [address]      Provider Number: [id number]  Attending Physician Name and Address:  Oswald Hillock, MD  Relative Name and Phone Number:  [name] [phone]    Current Level of Care: Hospital Recommended Level of Care: [place] Prior Approval Number:    Date Approved/Denied:   PASRR Number: [id number]  Discharge Plan: SNF    Current Diagnoses: Patient Active Problem List   Diagnosis Date Noted  . Displaced bimalleolar fracture of right ankle, closed, initial encounter 06/02/2018  . Avascular necrosis of bone of left hip ([place]) 04/03/2018  . Mucopurulent chronic bronchitis ([place]) 03/11/2018  . Allergic rhinitis 03/11/2018  . Anxiety and depression 12/29/2017  . Acute respiratory failure with hypoxia ([place]) 12/28/2017  . Syncope 08/29/2017  . DKA (diabetic ketoacidoses) ([place]) 03/02/2017  . COPD exacerbation ([place]) 02/23/2017  . Acute on chronic respiratory failure with hypoxia and hypercapnia ([place]) 08/19/2016  . Leukocytosis 08/19/2016  . CKD (chronic kidney disease), stage III ([place]) 08/19/2016  . Altered mental status 08/19/2016  . Asthma exacerbation 06/18/2016  . Insomnia 06/12/2016  . Difficult intravenous access   . Acute on chronic respiratory failure with hypoxia ([place]) 05/19/2016  . NSTEMI (non-ST elevated myocardial infarction) ([place]) 05/19/2016  . Acute metabolic encephalopathy 92/33/0076  . Hyperlipidemia   . Class 3 obesity due to excess calories with serious comorbidity and body mass index (BMI) of 40.0 to 44.9 in adult   . Obesity hypoventilation syndrome ([place])   . Diastolic dysfunction 22/63/3354  . Diabetic neuropathy ([place])  11/10/2014  . Fracture of distal femur ([place]) 07/08/2014  . Osteoporosis 07/08/2014  . Femur fracture, left ([place]) 07/08/2014  . Colles' fracture of left radius   . Left hip pain 05/03/2014  . Perimenopausal symptoms 02/13/2012  . Hyponatremia 12/29/2011  . Asthma exacerbation attacks 12/28/2011  . Bronchitis 12/28/2011  . Repeated falls 07/30/2011  . Hip fracture, left ([place]) 07/16/2011  . Femoral neck fracture ([place]) 07/16/2011  . Tobacco abuse 04/14/2010  . Hyperlipidemia associated with type 2 diabetes mellitus ([place]) 12/23/2008  . COPD (chronic obstructive pulmonary disease) ([place]) 11/29/2008  . THRUSH 05/20/2008  . Human immunodeficiency virus (HIV) disease ([place]) 04/19/2008  . Diabetes mellitus type 2 with complications, uncontrolled ([place]) 04/19/2008  . Mood disorder ([place]) 04/19/2008  . Hereditary and idiopathic peripheral neuropathy 04/19/2008  . Hypertension associated with diabetes ([place]) 04/19/2008    Orientation RESPIRATION BLADDER Height & Weight     Self, Time, Situation, Place  O2(Nasal cannula 3L) Continent, External catheter Weight: 234 lb (106.1 kg) Height:  5\' 10"  (177.8 cm)  BEHAVIORAL SYMPTOMS/MOOD NEUROLOGICAL BOWEL NUTRITION STATUS      Continent Diet(Please see DC Summary)  AMBULATORY STATUS COMMUNICATION OF NEEDS Skin   Extensive Assist Verbally Surgical wounds(Closed incision on ankle;)                       Personal Care Assistance Level of Assistance  Bathing, Feeding Bathing Assistance: Maximum assistance Feeding assistance: Independent       Functional Limitations Info  Sight, Hearing, Speech Sight Info: Impaired Hearing Info: Adequate Speech Info: Adequate    SPECIAL CARE FACTORS FREQUENCY  PT (By licensed PT),  OT (By licensed OT)     PT Frequency: 5x/week OT Frequency: 3x/week            Contractures Contractures Info: Not present    Additional Factors Info  Code Status, Allergies, Isolation Precautions, Insulin Sliding  Scale, Psychotropic Code Status Info: DNR Allergies Info: Cortizone-10 Psychotropic Info: Lyrica; Zoloft Insulin Sliding Scale Info: See DC Summary for dose Isolation Precautions Info: MRSA in the nose     Current Medications (06/17/2018):  This is the current hospital active medication list Current Facility-Administered Medications  Medication Dose Route Frequency Provider Last Rate Last Dose  . 0.9 %  sodium chloride infusion   Intravenous Continuous Oswald Hillock, MD 100 mL/hr at 06/17/18 0408    . albuterol (PROVENTIL) (2.5 MG/3ML) 0.083% nebulizer solution 3 mL  3 mL Inhalation Q6H PRN Etta Quill, DO      . bictegravir-emtricitabine-tenofovir AF (BIKTARVY) 50-200-25 MG per tablet 1 tablet  1 tablet Oral Daily Campbell Riches, MD   1 tablet at 06/17/18 0910  . dextrose 50 % solution 25-50 mL  25-50 mL Intravenous PRN Etta Quill, DO   50 mL at 06/02/18 0327  . enoxaparin (LOVENOX) injection 40 mg  40 mg Subcutaneous Q24H [place] Eric J, DO   40 mg at 06/17/18 0910  . fluticasone (FLONASE) 50 MCG/ACT nasal spray 1 spray  1 spray Each Nare Daily Donne Hazel, MD   1 spray at 06/17/18 0915  . fluticasone furoate-vilanterol (BREO ELLIPTA) 100-25 MCG/INH 1 puff  1 puff Inhalation Daily Jennette Kettle M, DO   1 puff at 06/17/18 0750   And  . umeclidinium bromide (INCRUSE ELLIPTA) 62.5 MCG/INH 1 puff  1 puff Inhalation Daily Jennette Kettle M, DO   1 puff at 06/17/18 0750  . insulin aspart (novoLOG) injection 0-5 Units  0-5 Units Subcutaneous QHS Heath Lark D, DO   3 Units at 06/16/18 2219  . insulin aspart (novoLOG) injection 0-9 Units  0-9 Units Subcutaneous TID WC Shah, Pratik D, DO   5 Units at 06/17/18 0911  . insulin aspart (novoLOG) injection 5 Units  5 Units Subcutaneous TID WC Donne Hazel, MD   5 Units at 06/17/18 0911  . insulin detemir (LEVEMIR) injection 25 Units  25 Units Subcutaneous QHS Oswald Hillock, MD   25 Units at 06/16/18 2220  . montelukast (SINGULAIR)  tablet 10 mg  10 mg Oral QHS Jennette Kettle M, DO   10 mg at 06/16/18 2219  . naphazoline-glycerin (CLEAR EYES REDNESS) ophth solution 1-2 drop  1-2 drop Both Eyes QID PRN Donne Hazel, MD   1 drop at 06/13/18 0912  . oxyCODONE (Oxy IR/ROXICODONE) immediate release tablet 10 mg  10 mg Oral Q4H PRN Schorr, Rhetta Mura, NP   10 mg at 06/17/18 0909  . pregabalin (LYRICA) capsule 225 mg  225 mg Oral BID Etta Quill, DO   225 mg at 06/17/18 2440  . rosuvastatin (CRESTOR) tablet 20 mg  20 mg Oral Daily Jennette Kettle M, DO   20 mg at 06/17/18 0910  . sertraline (ZOLOFT) tablet 200 mg  200 mg Oral Daily Donne Hazel, MD   200 mg at 06/17/18 1027  . sodium chloride flush (NS) 0.9 % injection 10-40 mL  10-40 mL Intracatheter PRN Manuella Ghazi, Pratik D, DO   20 mL at 06/14/18 0740  . traMADol (ULTRAM) tablet 50 mg  50 mg Oral Q12H PRN Kirby-Graham, Karsten Fells, NP   50  mg at 06/16/18 2226     Discharge Medications: Please see discharge summary for a list of discharge medications.  Relevant Imaging Results:  Relevant Lab Results:   Additional Information SSN: [id number]   COVID negative on 06/02/18. On HIV meds  Benard Halsted, LCSW

## 2018-06-17 NOTE — Care Management Important Message (Signed)
Important Message  Patient Details  Name: [name] MRN: [id number] Date of Birth: [birth year]   Medicare Important Message Given:  Yes    Natoria Archibald 06/17/2018, 1:47 PM

## 2018-06-17 NOTE — TOC Initial Note (Signed)
Transition of Care [facility]) - Initial/Assessment Note    Patient Details  Name: [name] MRN: [id number] Date of Birth: 07-Mar-1965  Transition of Care [facility]) CM/SW Contact:    Benard Halsted, LCSW Phone Number: 06/17/2018, 12:42 PM  Clinical Narrative:                 CSW received consult for possible SNF placement at time of discharge. CSW spoke with patient regarding recommendation of SNF placement at time of discharge. Patient reported that she lives alone and family is currently unable to care for patient given patient's current physical needs and fall risk. Patient expressed understanding and need for PT recommendation and is agreeable to SNF placement at time of discharge. CSW discussed insurance authorization process and will provide Medicare SNF ratings list and bed offers as available. Patient expressed being hopeful for rehab and to feel better soon. No further questions reported at this time. CSW to continue to follow and assist with discharge planning needs.   Expected Discharge Plan: Skilled Nursing Facility Barriers to Discharge: Insurance Authorization, Continued Medical Work up   Patient Goals and CMS Choice Patient states their goals for this hospitalization and ongoing recovery are:: Rehab CMS Medicare.gov Compare Post Acute Care list provided to:: Patient Choice offered to / list presented to : Patient  Expected Discharge Plan and Services Expected Discharge Plan: [place] In-house Referral: Clinical Social Work Discharge Planning Services: NA Post Acute Care Choice: [place] Living arrangements for the past 2 months: Single Family Home                           HH Arranged: NA          Prior Living Arrangements/Services Living arrangements for the past 2 months: Single Family Home Lives with:: Self Patient language and need for interpreter reviewed:: Yes Do you feel safe going back to the place where you live?: No    Lives alone  Need for Family Participation in Patient Care: No (Comment) Care giver support system in place?: No (comment)   Criminal Activity/Legal Involvement Pertinent to Current Situation/Hospitalization: No - Comment as needed  Activities of Daily Living Home Assistive Devices/Equipment: Bedside commode/3-in-1, Blood pressure cuff, CBG Meter, Oxygen, Grab bars in shower, Wheelchair, Shower chair with back ADL Screening (condition at time of admission) Patient's cognitive ability adequate to safely complete daily activities?: Yes Is the patient deaf or have difficulty hearing?: No Does the patient have difficulty seeing, even when wearing glasses/contacts?: No Does the patient have difficulty concentrating, remembering, or making decisions?: No Patient able to express need for assistance with ADLs?: Yes Does the patient have difficulty dressing or bathing?: Yes Independently performs ADLs?: No Communication: Independent Dressing (OT): Needs assistance Is this a change from baseline?: Change from baseline, expected to last >3 days Grooming: Independent Feeding: Independent Bathing: Independent Toileting: Needs assistance Is this a change from baseline?: Change from baseline, expected to last <3 days In/Out Bed: Needs assistance Is this a change from baseline?: Change from baseline, expected to last >3 days Walks in Home: Dependent Is this a change from baseline?: Pre-admission baseline Does the patient have difficulty walking or climbing stairs?: Yes Weakness of Legs: Right Weakness of Arms/Hands: None  Permission Sought/Granted Permission sought to share information with : Facility Art therapist granted to share information with : Yes, Verbal Permission Granted     Permission granted to share info w AGENCY: SNFs  Emotional Assessment Appearance:: Appears stated age Attitude/Demeanor/Rapport: Gracious Affect (typically observed): Flat,  Appropriate Orientation: : Oriented to  Time, Oriented to Situation, Oriented to Place, Oriented to Self Alcohol / Substance Use: Not Applicable Psych Involvement: No (comment)  Admission diagnosis:  Hypoglycemia [E16.2] Hypoxia [R09.02] Fall at home, initial encounter [W19.XXXA, I96.789] Bimalleolar ankle fracture, right, closed, initial encounter [S82.841A] Closed right ankle fracture, initial encounter [S82.891A] Patient Active Problem List   Diagnosis Date Noted  . Displaced bimalleolar fracture of right ankle, closed, initial encounter 06/02/2018  . Avascular necrosis of bone of left hip ([place]) 04/03/2018  . Mucopurulent chronic bronchitis ([place]) 03/11/2018  . Allergic rhinitis 03/11/2018  . Anxiety and depression 12/29/2017  . Acute respiratory failure with hypoxia ([place]) 12/28/2017  . Syncope 08/29/2017  . DKA (diabetic ketoacidoses) ([place]) 03/02/2017  . COPD exacerbation ([place]) 02/23/2017  . Acute on chronic respiratory failure with hypoxia and hypercapnia ([place]) 08/19/2016  . Leukocytosis 08/19/2016  . CKD (chronic kidney disease), stage III ([place]) 08/19/2016  . Altered mental status 08/19/2016  . Asthma exacerbation 06/18/2016  . Insomnia 06/12/2016  . Difficult intravenous access   . Acute on chronic respiratory failure with hypoxia ([place]) 05/19/2016  . NSTEMI (non-ST elevated myocardial infarction) ([place]) 05/19/2016  . Acute metabolic encephalopathy 38/10/1749  . Hyperlipidemia   . Class 3 obesity due to excess calories with serious comorbidity and body mass index (BMI) of 40.0 to 44.9 in adult   . Obesity hypoventilation syndrome ([place])   . Diastolic dysfunction 02/58/5277  . Diabetic neuropathy ([place]) 11/10/2014  . Fracture of distal femur ([place]) 07/08/2014  . Osteoporosis 07/08/2014  . Femur fracture, left ([place]) 07/08/2014  . Colles' fracture of left radius   . Left hip pain 05/03/2014  . Perimenopausal symptoms 02/13/2012  . Hyponatremia 12/29/2011  . Asthma  exacerbation attacks 12/28/2011  . Bronchitis 12/28/2011  . Repeated falls 07/30/2011  . Hip fracture, left ([place]) 07/16/2011  . Femoral neck fracture ([place]) 07/16/2011  . Tobacco abuse 04/14/2010  . Hyperlipidemia associated with type 2 diabetes mellitus ([place]) 12/23/2008  . COPD (chronic obstructive pulmonary disease) ([place]) 11/29/2008  . THRUSH 05/20/2008  . Human immunodeficiency virus (HIV) disease ([place]) 04/19/2008  . Diabetes mellitus type 2 with complications, uncontrolled ([place]) 04/19/2008  . Mood disorder ([place]) 04/19/2008  . Hereditary and idiopathic peripheral neuropathy 04/19/2008  . Hypertension associated with diabetes ([place]) 04/19/2008   PCP:  [name], PA-C Pharmacy:   [facility] [place], [place] [address] Phone: [phone] Fax: [phone]     Social Determinants of Health (SDOH) Interventions    Readmission Risk Interventions No flowsheet data found.

## 2018-06-17 NOTE — Progress Notes (Signed)
Inpatient Diabetes Program Recommendations  AACE/ADA: New Consensus Statement on Inpatient Glycemic Control (2015)  Target Ranges:  Prepandial:   less than 140 mg/dL      Peak postprandial:   less than 180 mg/dL (1-2 hours)      Critically ill patients:  140 - 180 mg/dL   Lab Results  Component Value Date   GLUCAP 257 (H) 06/17/2018   HGBA1C 8.6 (H) 01/21/2018    Review of Glycemic Control Results for [name], [name] (MRN [id number]) as of 06/17/2018 11:07  Ref. Range 06/16/2018 12:51 06/16/2018 17:51 06/16/2018 21:01 06/17/2018 07:47  Glucose-Capillary Latest Ref Range: 70 - 99 mg/dL 202 (H) 279 (H) 263 (H) 257 (H)   Diabetes history: DM 2, Last saw Dr Dwyane Dee, endocrinology on 05/30/2018 Outpatient Diabetes medications: Invokana 100 mg Daily, Humulin R U-500 45 units breakfast-30 units lunch-15 units supper, Toujeo 60 units bid  Current orders for Inpatient glycemic control:  Levemir 25 units daily at hs Novolog 0-9 units tid Novolog 0-5 units qhs Novolog 5 units tid  Inpatient Diabetes Program Recommendations:    Trends continue to exceed inpatient goals of 180 mg/dL, as FSBG was 257mg /dL and post prandials on 6/15 were 279 mg/dL and 263 mg/dL.   -  Consider Levemir 30 units QHS   -  Consider increasing Novolog meal coverage to 8 units tid if patient is consuming at least 50% of meals  Will need close follow up with her Endocrinologist with insulin dosing. Would mirror inpatient dosing for outpatient and have her follow up with Endocrinology.  Thanks, Bronson Curb, MSN, RNC-OB Diabetes Coordinator [phone] (8a-5p)

## 2018-06-18 LAB — GLUCOSE, CAPILLARY
Glucose-Capillary: 292 mg/dL — ABNORMAL HIGH (ref 70–99)
Glucose-Capillary: 253 mg/dL — ABNORMAL HIGH (ref 70–99)
Glucose-Capillary: 211 mg/dL — ABNORMAL HIGH (ref 70–99)
Glucose-Capillary: 162 mg/dL — ABNORMAL HIGH (ref 70–99)

## 2018-06-18 LAB — BASIC METABOLIC PANEL
Anion gap: 10 (ref 5–15)
BUN: 6 mg/dL (ref 6–20)
CO2: 27 mmol/L (ref 22–32)
Calcium: 8.4 mg/dL — ABNORMAL LOW (ref 8.9–10.3)
Chloride: 101 mmol/L (ref 98–111)
Creatinine, Ser: 0.88 mg/dL (ref 0.44–1.00)
GFR calc Af Amer: >60 mL/min (ref >60)
GFR calc non Af Amer: >60 mL/min (ref >60)
Glucose, Bld: 294 mg/dL — ABNORMAL HIGH (ref 70–99)
Potassium: 3.4 mmol/L — ABNORMAL LOW (ref 3.5–5.1)
Sodium: 138 mmol/L (ref 135–145)

## 2018-06-18 LAB — CBC
HCT: 30.9 % — ABNORMAL LOW (ref 36.0–46.0)
Hemoglobin: 9.2 g/dL — ABNORMAL LOW (ref 12.0–15.0)
MCH: 27.6 pg (ref 26.0–34.0)
MCHC: 29.8 g/dL — ABNORMAL LOW (ref 30.0–36.0)
MCV: 92.8 fL (ref 80.0–100.0)
Platelets: 453 10*3/uL — ABNORMAL HIGH (ref 150–400)
RBC: 3.33 MIL/uL — ABNORMAL LOW (ref 3.87–5.11)
RDW: 17.4 % — ABNORMAL HIGH (ref 11.5–15.5)
WBC: 12 10*3/uL — ABNORMAL HIGH (ref 4.0–10.5)
nRBC: 0.2 % (ref 0.0–0.2)

## 2018-06-18 MED ORDER — ENSURE MAX PROTEIN PO LIQD
11.0000 [oz_av] | Freq: Every day | ORAL | Status: DC
  Administered 2018-06-18 – 2018-06-23 (×6): 11 [oz_av] via ORAL
  Filled 2018-06-18 (×6): qty 330

## 2018-06-18 MED ORDER — ADULT MULTIVITAMIN W/MINERALS CH
1.0000 | ORAL_TABLET | Freq: Every day | ORAL | Status: DC
  Administered 2018-06-18 – 2018-06-23 (×6): 1 via ORAL
  Filled 2018-06-18 (×6): qty 1

## 2018-06-18 MED ORDER — SENNOSIDES-DOCUSATE SODIUM 8.6-50 MG PO TABS
1.0000 | ORAL_TABLET | Freq: Two times a day (BID) | ORAL | Status: DC
  Administered 2018-06-18 – 2018-06-23 (×11): 1 via ORAL
  Filled 2018-06-18 (×11): qty 1

## 2018-06-18 MED ORDER — INSULIN ASPART 100 UNIT/ML ~~LOC~~ SOLN
8.0000 [IU] | Freq: Three times a day (TID) | SUBCUTANEOUS | Status: DC
  Administered 2018-06-18 – 2018-06-23 (×18): 8 [IU] via SUBCUTANEOUS

## 2018-06-18 MED ORDER — POLYETHYLENE GLYCOL 3350 17 G PO PACK: 17.0000 g | PACK | Freq: Every day | ORAL | Status: DC | PRN

## 2018-06-18 MED ORDER — INSULIN DETEMIR 100 UNIT/ML ~~LOC~~ SOLN
20.0000 [IU] | Freq: Two times a day (BID) | SUBCUTANEOUS | Status: DC
  Administered 2018-06-18 – 2018-06-20 (×4): 20 [IU] via SUBCUTANEOUS
  Filled 2018-06-18 (×5): qty 0.2

## 2018-06-18 NOTE — Progress Notes (Signed)
Inpatient Diabetes Program Recommendations  AACE/ADA: New Consensus Statement on Inpatient Glycemic Control (2015)  Target Ranges:  Prepandial:   less than 140 mg/dL      Peak postprandial:   less than 180 mg/dL (1-2 hours)      Critically ill patients:  140 - 180 mg/dL   Lab Results  Component Value Date   GLUCAP 292 (H) 06/18/2018   HGBA1C 8.6 (H) 01/21/2018    Review of Glycemic Control Results for [name], [name] (MRN [id number]) as of 06/18/2018 11:17  Ref. Range 06/17/2018 11:58 06/17/2018 16:47 06/17/2018 21:48 06/18/2018 07:41  Glucose-Capillary Latest Ref Range: 70 - 99 mg/dL 283 (H) 244 (H) 273 (H) 292 (H)   Diabetes history:DM 2, Last saw Dr Dwyane Dee, endocrinology on 05/30/2018 Outpatient Diabetes medications: Invokana 100 mg Daily, Humulin R U-500 45 units breakfast-30 units lunch-15 units supper, Toujeo 60 units bid  Current orders for Inpatient glycemic control:  Levemir30units daily at hs Novolog 0-9 units tid Novolog 0-5 units qhs Novolog 8 units tid  Inpatient Diabetes Program Recommendations:  Noted increase to meal coverage this AM.   Consider increasing to Levemir 20 units BID.  Thanks, Bronson Curb, MSN, RNC-OB Diabetes Coordinator [phone] (8a-5p)

## 2018-06-18 NOTE — Progress Notes (Signed)
Patient ID: [name], female   DOB: June 05, 1965, 53 y.o.   MRN: [id number]  PROGRESS NOTE    BRAELYNN BENNING  AES:[id number] DOB: [birth year] DOA: 06/01/2018 PCP: [name], PA-C   Brief Narrative:  53 year old female with history of severe COPD on home oxygen, HIV, diabetes mellitus type 2 status post left BKA presented on 06/01/2018 after a mechanical fall and was found to have right bimalleolar fracture with dislocation.  She underwent external fixation on 06/02/2018 by orthopedics.  Assessment & Plan:   Principal Problem:   Displaced bimalleolar fracture of right ankle, closed, initial encounter Active Problems:   Human immunodeficiency virus (HIV) disease ([place])   Diabetes mellitus type 2 with complications, uncontrolled ([place])   COPD (chronic obstructive pulmonary disease) ([place])   Obesity hypoventilation syndrome ([name])   Acute metabolic encephalopathy   Acute on chronic respiratory failure with hypoxia and hypercapnia ([name])   CKD (chronic kidney disease), stage III ([name])  Right bimalleolar ankle fracture/dislocation following a fall -Status post external fixation on 06/02/2018 by orthopedics.  Orthopedics planning to do more surgical intervention next week once swelling improves. -Continue pain management.  Fall precautions.  Acute on chronic hypoxic hypercapnic respiratory failure Acute metabolic encephalopathy -Following surgical intervention, patient became hypoxic and encephalopathic.  Chest x-ray had showed no acute cardiopulmonary findings.  COVID-19 test was negative. -Most likely from obesity hypoventilation syndrome. -Resolved.  Mental status is probably back to baseline. -Currently on oxygen via nasal cannula at 3 L/min which is her baseline home oxygen requirement. -Monitor mental status.  Diabetes mellitus type 2 with hyperglycemia -Increase Levemir to 20 units twice daily.  Increase NovoLog to 8 units 3 times a day with meals.  Continue CBGs with SSI   CKD stage III  -Creatinine stable.  HIV -Continue current antiretroviral regimen.  Follows up with Dr. Johnnye Sima as an outpatient  COPD -Stable.  Continue current regimen.  Hypokalemia -Replace.    Generalized deconditioning -PT recommends SNF.  Patient will need to be brought back from SNF for further surgical intervention   DVT prophylaxis: Lovenox Code Status: DNR Family Communication: None at bedside Disposition Plan: SNF once bed is available  Consultants: Orthopedics  Procedures: External fixation of the right lower extremity on 06/02/2018  Antimicrobials: None   Subjective: Patient seen and examined at bedside.  She denies any overnight fever, nausea or vomiting.  No worsening right lower extremity pain.  Objective: Vitals:   06/17/18 1952 06/17/18 2136 06/18/18 0706 06/18/18 0816  BP: (!) 115/58 (!) 110/55 (!) 115/32   Pulse:  83 78 84  Resp:    16  Temp:  98.7 F (37.1 C) 99 F (37.2 C)   TempSrc:  Oral Oral   SpO2:  95% 94% 94%  Weight:      Height:        Intake/Output Summary (Last 24 hours) at 06/18/2018 1302 Last data filed at 06/18/2018 0543 Gross per 24 hour  Intake 1944 ml  Output 1000 ml  Net 944 ml   Filed Weights   06/01/18 2224  Weight: 106.1 kg    Examination:  General exam: Appears calm and comfortable.  Poor historian Respiratory system: Bilateral decreased breath sounds at bases Cardiovascular system: S1 & S2 heard, Rate controlled Gastrointestinal system: Abdomen is nondistended, soft and nontender. Normal bowel sounds heard. Extremities: No cyanosis, clubbing; left BKA.  Right lower extremity dressing with external fixator present.   Data Reviewed: I have personally reviewed following labs and imaging studies  CBC: Recent Labs  Lab 06/12/18 0450 06/15/18 0858 06/18/18 0404  WBC 13.1* 12.1* 12.0*  HGB 10.0* 8.9* 9.2*  HCT 32.8* 29.3* 30.9*  MCV 90.9 91.3 92.8  PLT 292 346 169*   Basic Metabolic Panel: Recent Labs   Lab 06/12/18 0450 06/15/18 0858 06/16/18 1557 06/18/18 0404  NA 134* 132* 139 138  K 3.8 3.4* 3.5 3.4*  CL 92* 94* 103 101  CO2 29 27 27 27   GLUCOSE 295* 308* 296* 294*  BUN 18 11 8 6   CREATININE 1.07* 0.97 0.98 0.88  CALCIUM 8.8* 8.2* 8.1* 8.4*   GFR: Estimated Creatinine Clearance: 96.3 mL/min (by C-G formula based on SCr of 0.88 mg/dL). Liver Function Tests: Recent Labs  Lab 06/15/18 0858  AST 15  ALT 18  ALKPHOS 67  BILITOT 0.6  PROT 5.2*  ALBUMIN 2.0*   No results for input(s): LIPASE, AMYLASE in the last 168 hours. No results for input(s): AMMONIA in the last 168 hours. Coagulation Profile: No results for input(s): INR, PROTIME in the last 168 hours. Cardiac Enzymes: No results for input(s): CKTOTAL, CKMB, CKMBINDEX, TROPONINI in the last 168 hours. BNP (last 3 results) No results for input(s): PROBNP in the last 8760 hours. HbA1C: No results for input(s): HGBA1C in the last 72 hours. CBG: Recent Labs  Lab 06/17/18 1158 06/17/18 1647 06/17/18 2148 06/18/18 0741 06/18/18 1141  GLUCAP 283* 244* 273* 292* 253*   Lipid Profile: No results for input(s): CHOL, HDL, LDLCALC, TRIG, CHOLHDL, LDLDIRECT in the last 72 hours. Thyroid Function Tests: No results for input(s): TSH, T4TOTAL, FREET4, T3FREE, THYROIDAB in the last 72 hours. Anemia Panel: No results for input(s): VITAMINB12, FOLATE, FERRITIN, TIBC, IRON, RETICCTPCT in the last 72 hours. Sepsis Labs: No results for input(s): PROCALCITON, LATICACIDVEN in the last 168 hours.  No results found for this or any previous visit (from the past 240 hour(s)).       Radiology Studies: Dg Ankle Complete Right  Result Date: 06/16/2018 CLINICAL DATA:  Pt c/o right ankle fracture. Hx of ex-fix placement and previous right ankle fracture. EXAM: RIGHT ANKLE - COMPLETE 3+ VIEW COMPARISON:  Plain film of the RIGHT ankle dated 06/01/2018. FINDINGS: External fixation hardware now in place, traversing the RIGHT ankle  joint. Again noted are displaced fractures of the medial and lateral malleoli. Osseous alignment is significantly improved compared to the earlier plain film examination. Ankle mortise is now symmetric. IMPRESSION: External fixation hardware now in place traversing the RIGHT ankle joint. Osseous alignment is significantly improved compared to the earlier plain film examination. Electronically Signed   By: Franki Cabot M.D.   On: 06/16/2018 13:30        Scheduled Meds: . bictegravir-emtricitabine-tenofovir AF  1 tablet Oral Daily  . enoxaparin (LOVENOX) injection  40 mg Subcutaneous Q24H  . fluticasone  1 spray Each Nare Daily  . fluticasone furoate-vilanterol  1 puff Inhalation Daily   And  . umeclidinium bromide  1 puff Inhalation Daily  . insulin aspart  0-5 Units Subcutaneous QHS  . insulin aspart  0-9 Units Subcutaneous TID WC  . insulin aspart  8 Units Subcutaneous TID WC  . insulin detemir  30 Units Subcutaneous QHS  . montelukast  10 mg Oral QHS  . pregabalin  225 mg Oral BID  . rosuvastatin  20 mg Oral Daily  . senna-docusate  1 tablet Oral BID  . sertraline  200 mg Oral Daily   Continuous Infusions:   LOS: 16 days  Aline August, MD [facility] 06/18/2018, 1:02 PM

## 2018-06-18 NOTE — Evaluation (Signed)
Physical Therapy Evaluation Patient Details Name: [name] MRN: [id number] DOB: 03-23-1965 Today's Date: 06/18/2018   History of Present Illness  Patient was admitted to the hospital with a fall and right bimalleolar fx on 06/01/2018. She had an external fixator placed on 06/02/2018. She has a left BKA. She was perfroming stand pivot transfers to a wheelchair prior to her fall. PMH: HTN, HIV, hip fx, distal femur fx, peripheral neuopathy, fever, fatigue, DKA, diarhea, DMII, COPD, class 3 obesity, CKD, anxiety, resp failure,   Clinical Impression  Patient required mod a to get in and out of bed. She was unable to perform stand pivot transfer with her prosthetic. She needs to have someone bring her socks for her prosthetic but even with the socks she will likely not be able to transfer at this time. She would benefit from rehab at a SNF. Acute therapy will continue to work with patient to improve her ability to transfer.     Follow Up Recommendations SNF    Equipment Recommendations  None recommended by PT    Recommendations for Other Services Rehab consult     Precautions / Restrictions Precautions Precautions: None Restrictions Weight Bearing Restrictions: Yes RLE Weight Bearing: Non weight bearing      Mobility  Bed Mobility Overal bed mobility: Needs Assistance Bed Mobility: Supine to Sit;Sit to Supine     Supine to sit: Mod assist;+2 for physical assistance;+2 for safety/equipment Sit to supine: Mod assist;+2 for physical assistance;+2 for safety/equipment   General bed mobility comments: Mod a to control right LE out of bed. Mod a to het left leg back into the bed. Mod a to scoot to the edge of the bed.   Transfers Overall transfer level: Needs assistance Equipment used: Rolling walker (2 wheeled) Transfers: Sit to/from Stand Sit to Stand: +2 physical assistance;+2 safety/equipment;Total assist         General transfer comment: total assist to stand. Unable to  come rto full standing despite total assist.   Ambulation/Gait                Stairs            Wheelchair Mobility    Modified Rankin (Stroke Patients Only)       Balance Overall balance assessment: Needs assistance Sitting-balance support: No upper extremity supported;Feet supported Sitting balance-Leahy Scale: Good     Standing balance support: Bilateral upper extremity supported Standing balance-Leahy Scale: Poor Standing balance comment: unable to come to standing                              Pertinent Vitals/Pain Pain Assessment: No/denies pain    Home Living Family/patient expects to be discharged to:: Private residence Living Arrangements: Alone Available Help at Discharge: Family;Available PRN/intermittently Type of Home: House Home Access: Ramped entrance     Home Layout: One level Home Equipment: Walker - 2 wheels;Cane - single point;Wheelchair - [employer];Shower seat      Prior Function Level of Independence: Independent with assistive device(s)         Comments: uses WC transfers mostly, rarely ambualtes; has aid 4 hrs/day 7 days/wk     Hand Dominance   Dominant Hand: Right    Extremity/Trunk Assessment   Upper Extremity Assessment Upper Extremity Assessment: Overall WFL for tasks assessed    Lower Extremity Assessment Lower Extremity Assessment: Overall WFL for tasks assessed    Cervical / Trunk Assessment Cervical /  Trunk Assessment: Normal  Communication   Communication: No difficulties  Cognition Arousal/Alertness: Awake/alert Behavior During Therapy: WFL for tasks assessed/performed Overall Cognitive Status: Within Functional Limits for tasks assessed                                        General Comments General comments (skin integrity, edema, etc.): ex fix on right LE     Exercises     Assessment/Plan    PT Assessment Patient needs continued PT services  PT  Problem List Decreased strength;Decreased range of motion;Decreased activity tolerance;Decreased mobility;Decreased knowledge of use of DME;Decreased safety awareness;Decreased balance;Pain       PT Treatment Interventions DME instruction;Gait training;Functional mobility training;Therapeutic activities;Therapeutic exercise;Neuromuscular re-education;Patient/family education    PT Goals (Current goals can be found in the Care Plan section)  Acute Rehab PT Goals Patient Stated Goal: to have less pain  PT Goal Formulation: With patient Time For Goal Achievement: 06/25/18 Potential to Achieve Goals: Good    Frequency Min 3X/week   Barriers to discharge Decreased caregiver support hasintermittetn assist at home    Co-evaluation               AM-PAC PT "6 Clicks" Mobility  Outcome Measure Help needed turning from your back to your side while in a flat bed without using bedrails?: A Lot Help needed moving from lying on your back to sitting on the side of a flat bed without using bedrails?: A Lot Help needed moving to and from a bed to a chair (including a wheelchair)?: Total Help needed standing up from a chair using your arms (e.g., wheelchair or bedside chair)?: Total Help needed to walk in hospital room?: Total Help needed climbing 3-5 steps with a railing? : Total 6 Click Score: 8    End of Session Equipment Utilized During Treatment: Gait belt Activity Tolerance: Patient tolerated treatment well Patient left: in bed;with call bell/phone within reach;with chair alarm set Nurse Communication: Mobility status PT Visit Diagnosis: Unsteadiness on feet (R26.81);Other abnormalities of gait and mobility (R26.89);History of falling (Z91.81);Pain Pain - Right/Left: Right Pain - part of body: Ankle and joints of foot    Time: 1030-1050 PT Time Calculation (min) (ACUTE ONLY): 20 min   Charges:   PT Evaluation $PT Eval Moderate Complexity: 1 Mod            Carney Living PT DPT  06/18/2018, 12:16 PM

## 2018-06-18 NOTE — Progress Notes (Signed)
Initial Nutrition Assessment  RD working remotely.  DOCUMENTATION CODES:   Obesity unspecified  INTERVENTION:   -Ensure Max po daily, each supplement provides 150 kcal and 30 grams of protein -MVI with minerals daily  NUTRITION DIAGNOSIS:   Increased nutrient needs related to post-op healing as evidenced by estimated needs.  GOAL:   Patient will meet greater than or equal to 90% of their needs  MONITOR:   PO intake, Supplement acceptance, Labs, Weight trends, Skin, I & O's  REASON FOR ASSESSMENT:   LOS    ASSESSMENT:   [name] is a 53 y.o. female with medical history significant of severe COPD on home O2, HVOS, CKD, HIV, DM2 s/p L BKA.  Pt admitted with closed rt ankle fracture.   6/1- s/p PROCEDURE: External fixation right lower extremity, CPT 20692 multiplane  Reviewed I/O's: +1.2 L x 24 hours and +3 L since 06/04/18  UOP: 1 L x 24 hours   Per orthopedics notes, plan for continued external fixation until swelling decreased and complete ORIF.   Attempted to speak with pt via phone, however, no answer. RD unable to obtain further nutrition-related history at this time.   Meal completion variable; PO: 0-80%. On average, pt consuming 80-90% of meals.   Wt has been stable.   Per CSW notes, plan to d/c to SNF once medically stable.   Lab Results  Component Value Date   HGBA1C 8.6 (H) 01/21/2018   PTA DM medications are  humulin U-500 (45 units at breakfast, 30 units with lunhc, and 15 units before snacks) and 50 units insulin glargine BID.   Labs reviewed: K: 3.4, CBGS: 273-292 (inpatient orders for glycemic control are 0-5 units insulin aspart q HS, 0-9 units insulin aspart TID with meals, 8 units insulin aspart TID with meals, nd 20 units insulin detemir BID).   NUTRITION - FOCUSED PHYSICAL EXAM:    Most Recent Value  Orbital Region  Unable to assess  Upper Arm Region  Unable to assess  Thoracic and Lumbar Region  Unable to assess  Buccal Region   Unable to assess  Temple Region  Unable to assess  Clavicle Bone Region  Unable to assess  Clavicle and Acromion Bone Region  Unable to assess  Scapular Bone Region  Unable to assess  Dorsal Hand  Unable to assess  Patellar Region  Unable to assess  Anterior Thigh Region  Unable to assess  Posterior Calf Region  Unable to assess  Edema (RD Assessment)  Unable to assess  Hair  Unable to assess  Eyes  Unable to assess  Mouth  Unable to assess  Skin  Unable to assess  Nails  Unable to assess       Diet Order:   Diet Order            Diet Carb Modified Fluid consistency: Thin; Room service appropriate? Yes  Diet effective now              EDUCATION NEEDS:   No education needs have been identified at this time  Skin:  Skin Assessment: Skin Integrity Issues: Skin Integrity Issues:: Incisions, Other (Comment) Incisions: closed rt ankle Other: MASD bilateral buttocks  Last BM:  06/15/18  Height:   Ht Readings from Last 1 Encounters:  06/01/18 5\' 10"  (1.778 m)    Weight:   Wt Readings from Last 1 Encounters:  06/01/18 106.1 kg    Ideal Body Weight:  68 kg(adjusted for lt BKA)  BMI:  Body  mass index is 33.58 kg/m. BMI: 35.9 (adjusted for amputation)  Estimated Nutritional Needs:   Kcal:  1800-2000  Protein:  100-115 grams  Fluid:  > 1.8 L    Caleah Tortorelli A. Jimmye Norman, RD, LDN, [place] Registered Dietitian II Certified Diabetes Care and Education Specialist Pager: [phone] After hours Pager: [phone]

## 2018-06-19 LAB — GLUCOSE, CAPILLARY
Glucose-Capillary: 178 mg/dL — ABNORMAL HIGH (ref 70–99)
Glucose-Capillary: 178 mg/dL — ABNORMAL HIGH (ref 70–99)
Glucose-Capillary: 256 mg/dL — ABNORMAL HIGH (ref 70–99)
Glucose-Capillary: 258 mg/dL — ABNORMAL HIGH (ref 70–99)

## 2018-06-19 LAB — NOVEL CORONAVIRUS, NAA (HOSP ORDER, SEND-OUT TO REF LAB; TAT 18-24 HRS): SARS-CoV-2, NAA: NOT DETECTED

## 2018-06-19 NOTE — Progress Notes (Signed)
Reviewed and agree with assessment/plan.   Zelta Enfield, MD [facility] 12/28/2015, 12:24 PM Pager:  [phone]  

## 2018-06-19 NOTE — Progress Notes (Signed)
Patient ID: [name], female   DOB: June 28, 1965, 53 y.o.   MRN: [id number]  PROGRESS NOTE    [name]  XBM:[id number] DOB: February 25, 1965 DOA: 06/01/2018 PCP: [name], PA-C   Brief Narrative:  53 year old female with history of severe COPD on home oxygen, HIV, diabetes mellitus type 2 status post left BKA presented on 06/01/2018 after a mechanical fall and was found to have right bimalleolar fracture with dislocation.  She underwent external fixation on 06/02/2018 by orthopedics.  Assessment & Plan:   Principal Problem:   Displaced bimalleolar fracture of right ankle, closed, initial encounter Active Problems:   Human immunodeficiency virus (HIV) disease ([place])   Diabetes mellitus type 2 with complications, uncontrolled ([place])   COPD (chronic obstructive pulmonary disease) ([place])   Obesity hypoventilation syndrome ([name])   Acute metabolic encephalopathy   Acute on chronic respiratory failure with hypoxia and hypercapnia ([name])   CKD (chronic kidney disease), stage III ([name])  Right bimalleolar ankle fracture/dislocation following a fall -Status post external fixation on 06/02/2018 by orthopedics.  Orthopedics planning to do more surgical intervention next week once swelling improves. -Continue pain management.  Fall precautions.  Acute on chronic hypoxic hypercapnic respiratory failure Acute metabolic encephalopathy -Following surgical intervention, patient became hypoxic and encephalopathic.  Chest x-ray had showed no acute cardiopulmonary findings.  COVID-19 test was negative. -Most likely from obesity hypoventilation syndrome. -Resolved.  Mental status is probably back to baseline. -Currently on oxygen via nasal cannula at 3 L/min which is her baseline home oxygen requirement. -Monitor mental status.  Diabetes mellitus type 2 with hyperglycemia -Continue Levemir and NovoLog with meals.  Continue CBGs with SSI  CKD stage III  -Creatinine stable.  HIV -Continue  current antiretroviral regimen.  Follows up with Dr. Johnnye Sima as an outpatient  COPD -Stable.  Continue current regimen.  Generalized deconditioning -PT recommends SNF.  Patient will need to be brought back from SNF for further surgical intervention   DVT prophylaxis: Lovenox Code Status: DNR Family Communication: None at bedside Disposition Plan: SNF once bed is available  Consultants: Orthopedics  Procedures: External fixation of the right lower extremity on 06/02/2018  Antimicrobials: None   Subjective: Patient seen and examined at bedside.  Denies any overnight worsening right lower extremity pain.  No overnight fever or vomiting. Objective: Vitals:   06/18/18 1315 06/18/18 2142 06/19/18 0534 06/19/18 0722  BP: (!) 96/47 (!) 106/56 (!) 115/54   Pulse: 80 82 73   Resp:  16 17   Temp: 98.6 F (37 C) 98.4 F (36.9 C) 98 F (36.7 C)   TempSrc: Oral Oral Oral   SpO2: 99% 100% (!) 83% 96%  Weight:      Height:       No intake or output data in the 24 hours ending 06/19/18 1158 Filed Weights   06/01/18 2224  Weight: 106.1 kg    Examination:  General exam: Appears calm and comfortable.  No acute distress.  Poor historian Respiratory system: Bilateral decreased breath sounds at bases, scattered crackles Cardiovascular system: Rate controlled, S1-S2 heard. Gastrointestinal system: Abdomen is nondistended, soft and nontender. Normal bowel sounds heard. Extremities: No cyanosis, clubbing; left BKA.  Right lower extremity dressing with external fixator present.   Data Reviewed: I have personally reviewed following labs and imaging studies  CBC: Recent Labs  Lab 06/15/18 0858 06/18/18 0404  WBC 12.1* 12.0*  HGB 8.9* 9.2*  HCT 29.3* 30.9*  MCV 91.3 92.8  PLT 346 027*   Basic Metabolic  Panel: Recent Labs  Lab 06/15/18 0858 06/16/18 1557 06/18/18 0404  NA 132* 139 138  K 3.4* 3.5 3.4*  CL 94* 103 101  CO2 27 27 27   GLUCOSE 308* 296* 294*  BUN 11 8 6    CREATININE 0.97 0.98 0.88  CALCIUM 8.2* 8.1* 8.4*   GFR: Estimated Creatinine Clearance: 96.3 mL/min (by C-G formula based on SCr of 0.88 mg/dL). Liver Function Tests: Recent Labs  Lab 06/15/18 0858  AST 15  ALT 18  ALKPHOS 67  BILITOT 0.6  PROT 5.2*  ALBUMIN 2.0*   No results for input(s): LIPASE, AMYLASE in the last 168 hours. No results for input(s): AMMONIA in the last 168 hours. Coagulation Profile: No results for input(s): INR, PROTIME in the last 168 hours. Cardiac Enzymes: No results for input(s): CKTOTAL, CKMB, CKMBINDEX, TROPONINI in the last 168 hours. BNP (last 3 results) No results for input(s): PROBNP in the last 8760 hours. HbA1C: No results for input(s): HGBA1C in the last 72 hours. CBG: Recent Labs  Lab 06/18/18 0741 06/18/18 1141 06/18/18 1644 06/18/18 2135 06/19/18 0838  GLUCAP 292* 253* 211* 162* 178*   Lipid Profile: No results for input(s): CHOL, HDL, LDLCALC, TRIG, CHOLHDL, LDLDIRECT in the last 72 hours. Thyroid Function Tests: No results for input(s): TSH, T4TOTAL, FREET4, T3FREE, THYROIDAB in the last 72 hours. Anemia Panel: No results for input(s): VITAMINB12, FOLATE, FERRITIN, TIBC, IRON, RETICCTPCT in the last 72 hours. Sepsis Labs: No results for input(s): PROCALCITON, LATICACIDVEN in the last 168 hours.  No results found for this or any previous visit (from the past 240 hour(s)).       Radiology Studies: No results found.      Scheduled Meds: . bictegravir-emtricitabine-tenofovir AF  1 tablet Oral Daily  . enoxaparin (LOVENOX) injection  40 mg Subcutaneous Q24H  . fluticasone  1 spray Each Nare Daily  . fluticasone furoate-vilanterol  1 puff Inhalation Daily   And  . umeclidinium bromide  1 puff Inhalation Daily  . insulin aspart  0-5 Units Subcutaneous QHS  . insulin aspart  0-9 Units Subcutaneous TID WC  . insulin aspart  8 Units Subcutaneous TID WC  . insulin detemir  20 Units Subcutaneous BID  . montelukast  10  mg Oral QHS  . multivitamin with minerals  1 tablet Oral Daily  . pregabalin  225 mg Oral BID  . Ensure Max Protein  11 oz Oral Daily  . rosuvastatin  20 mg Oral Daily  . senna-docusate  1 tablet Oral BID  . sertraline  200 mg Oral Daily   Continuous Infusions:   LOS: 17 days        Aline August, MD [facility] 06/19/2018, 11:58 AM

## 2018-06-19 NOTE — Progress Notes (Signed)
Physical Therapy Treatment Patient Details Name: [name] MRN: [id number] DOB: [birth year] Today's Date: 06/19/2018    History of Present Illness Patient was admitted to the hospital with a fall and right bimalleolar fx on 06/01/2018. She had an external fixator placed on 06/02/2018. She has a left BKA. She was perfroming stand pivot transfers to a wheelchair prior to her fall. PMH: HTN, HIV, hip fx, distal femur fx, peripheral neuopathy, fever, fatigue, DKA, diarhea, DMII, COPD, class 3 obesity, CKD, anxiety, resp failure,     PT Comments    Patient needed increased assistance to the edge of the bed today but once she was there she was able to sit for several minutes. She tolerated there-ex well. Patient shown how to do bed exercises.    Follow Up Recommendations  SNF     Equipment Recommendations  None recommended by PT    Recommendations for Other Services Rehab consult     Precautions / Restrictions Precautions Precautions: None Restrictions Weight Bearing Restrictions: Yes RLE Weight Bearing: Non weight bearing    Mobility  Bed Mobility Overal bed mobility: Needs Assistance Bed Mobility: Supine to Sit;Sit to Supine     Supine to sit: Mod assist;+2 for physical assistance;+2 for safety/equipment Sit to supine: +2 for physical assistance;+2 for safety/equipment;Max assist   General bed mobility comments: max a to the edge of the bed. Mod a to get back into bed. Positioned in bed rolled slightly to the left with a pillow under the right   Transfers                 General transfer comment: declined   Ambulation/Gait                 Stairs             Wheelchair Mobility    Modified Rankin (Stroke Patients Only)       Balance Overall balance assessment: Needs assistance Sitting-balance support: No upper extremity supported;Feet supported Sitting balance-Leahy Scale: Good                                       Cognition Arousal/Alertness: Awake/alert Behavior During Therapy: WFL for tasks assessed/performed Overall Cognitive Status: Within Functional Limits for tasks assessed                                        Exercises General Exercises - Lower Extremity Quad Sets: 15 reps Gluteal Sets: (reported pain on her bottom )  LAQ left x20  Hip abduction x20  Hip adduction x20  Hamstring curl left x20      General Comments        Pertinent Vitals/Pain Pain Assessment: No/denies pain    Home Living Family/patient expects to be discharged to:: Private residence Living Arrangements: Alone Available Help at Discharge: Family;Available PRN/intermittently Type of Home: House Home Access: Ramped entrance   Home Layout: One level Home Equipment: Walker - 2 wheels;Cane - single point;Wheelchair - [employer];Shower seat      Prior Function Level of Independence: Independent with assistive device(s)      Comments: uses WC transfers mostly, rarely ambualtes; has aid 4 hrs/day 7 days/wk   PT Goals (current goals can now be found in the care plan section) Acute Rehab PT Goals Patient Stated Goal:  to have less pain  PT Goal Formulation: With patient Time For Goal Achievement: 06/25/18 Potential to Achieve Goals: Good    Frequency    Min 3X/week      PT Plan      Co-evaluation              AM-PAC PT "6 Clicks" Mobility   Outcome Measure  Help needed turning from your back to your side while in a flat bed without using bedrails?: A Lot Help needed moving from lying on your back to sitting on the side of a flat bed without using bedrails?: A Lot Help needed moving to and from a bed to a chair (including a wheelchair)?: Total Help needed standing up from a chair using your arms (e.g., wheelchair or bedside chair)?: Total Help needed to walk in hospital room?: Total Help needed climbing 3-5 steps with a railing? : Total 6 Click Score: 8     End of Session Equipment Utilized During Treatment: Gait belt Activity Tolerance: Patient tolerated treatment well Patient left: in bed;with call bell/phone within reach;with chair alarm set Nurse Communication: Mobility status PT Visit Diagnosis: Unsteadiness on feet (R26.81);Other abnormalities of gait and mobility (R26.89);History of falling (Z91.81);Pain Pain - Right/Left: Right Pain - part of body: Ankle and joints of foot     Time: 0255-0308 PT Time Calculation (min) (ACUTE ONLY): 13 min  Charges:  $Therapeutic Activity: 8-22 mins                       Carney Living  PT DPT  06/19/2018, 3:57 PM

## 2018-06-20 LAB — CBC WITH DIFFERENTIAL/PLATELET
Abs Immature Granulocytes: 0.2 10*3/uL — ABNORMAL HIGH (ref 0.00–0.07)
Basophils Absolute: 0.1 10*3/uL (ref 0.0–0.1)
Basophils Relative: 1 %
Eosinophils Absolute: 0.4 10*3/uL (ref 0.0–0.5)
Eosinophils Relative: 3 %
HCT: 31.7 % — ABNORMAL LOW (ref 36.0–46.0)
Hemoglobin: 9.6 g/dL — ABNORMAL LOW (ref 12.0–15.0)
Immature Granulocytes: 2 %
Lymphocytes Relative: 17 %
Lymphs Abs: 1.9 10*3/uL (ref 0.7–4.0)
MCH: 27.7 pg (ref 26.0–34.0)
MCHC: 30.3 g/dL (ref 30.0–36.0)
MCV: 91.4 fL (ref 80.0–100.0)
Monocytes Absolute: 0.9 10*3/uL (ref 0.1–1.0)
Monocytes Relative: 8 %
Neutro Abs: 8 10*3/uL — ABNORMAL HIGH (ref 1.7–7.7)
Neutrophils Relative %: 69 %
Platelets: 530 10*3/uL — ABNORMAL HIGH (ref 150–400)
RBC: 3.47 MIL/uL — ABNORMAL LOW (ref 3.87–5.11)
RDW: 17.6 % — ABNORMAL HIGH (ref 11.5–15.5)
WBC: 11.5 10*3/uL — ABNORMAL HIGH (ref 4.0–10.5)
nRBC: 0.3 % — ABNORMAL HIGH (ref 0.0–0.2)

## 2018-06-20 LAB — GLUCOSE, CAPILLARY
Glucose-Capillary: 226 mg/dL — ABNORMAL HIGH (ref 70–99)
Glucose-Capillary: 232 mg/dL — ABNORMAL HIGH (ref 70–99)
Glucose-Capillary: 174 mg/dL — ABNORMAL HIGH (ref 70–99)
Glucose-Capillary: 210 mg/dL — ABNORMAL HIGH (ref 70–99)

## 2018-06-20 LAB — BASIC METABOLIC PANEL
Anion gap: 9 (ref 5–15)
BUN: 6 mg/dL (ref 6–20)
CO2: 29 mmol/L (ref 22–32)
Calcium: 8.5 mg/dL — ABNORMAL LOW (ref 8.9–10.3)
Chloride: 99 mmol/L (ref 98–111)
Creatinine, Ser: 1.04 mg/dL — ABNORMAL HIGH (ref 0.44–1.00)
GFR calc Af Amer: >60 mL/min (ref >60)
GFR calc non Af Amer: >60 mL/min (ref >60)
Glucose, Bld: 242 mg/dL — ABNORMAL HIGH (ref 70–99)
Potassium: 3 mmol/L — ABNORMAL LOW (ref 3.5–5.1)
Sodium: 137 mmol/L (ref 135–145)

## 2018-06-20 LAB — MAGNESIUM: Magnesium: 1.5 mg/dL — ABNORMAL LOW (ref 1.7–2.4)

## 2018-06-20 MED ORDER — MAGNESIUM SULFATE 2 GM/50ML IV SOLN
2.0000 g | Freq: Once | INTRAVENOUS | Status: AC
  Administered 2018-06-20: 2 g via INTRAVENOUS
  Filled 2018-06-20: qty 50

## 2018-06-20 MED ORDER — INSULIN DETEMIR 100 UNIT/ML ~~LOC~~ SOLN
22.0000 [IU] | Freq: Two times a day (BID) | SUBCUTANEOUS | Status: DC
  Administered 2018-06-20 – 2018-06-23 (×6): 22 [IU] via SUBCUTANEOUS
  Filled 2018-06-20 (×7): qty 0.22

## 2018-06-20 MED ORDER — ALTEPLASE 2 MG IJ SOLR
2.0000 mg | Freq: Once | INTRAMUSCULAR | Status: AC
  Administered 2018-06-20: 2 mg
  Filled 2018-06-20: qty 2

## 2018-06-20 MED ORDER — POTASSIUM CHLORIDE CRYS ER 20 MEQ PO TBCR
40.0000 meq | EXTENDED_RELEASE_TABLET | ORAL | Status: AC
  Administered 2018-06-20 (×2): 40 meq via ORAL
  Filled 2018-06-20 (×2): qty 2

## 2018-06-20 NOTE — Care Management Important Message (Signed)
Important Message  Patient Details  Name: [name] MRN: [id number] Date of Birth: May 03, 1965   Medicare Important Message Given:  Yes     Braelin Costlow 06/20/2018, 11:26 AM

## 2018-06-20 NOTE — Progress Notes (Signed)
Inpatient Diabetes Program Recommendations  AACE/ADA: New Consensus Statement on Inpatient Glycemic Control (2015)  Target Ranges:  Prepandial:   less than 140 mg/dL      Peak postprandial:   less than 180 mg/dL (1-2 hours)      Critically ill patients:  140 - 180 mg/dL   Results for TRACHELLE, LOW (MRN [id number]) as of 06/20/2018 11:21  Ref. Range 06/19/2018 08:38 06/19/2018 13:12 06/19/2018 18:38 06/19/2018 21:14 06/20/2018 09:04  Glucose-Capillary Latest Ref Range: 70 - 99 mg/dL 178 (H) 178 (H) 256 (H) 258 (H) 226 (H)   Review of Glycemic Control  Diabetes history:DM 2, Last saw Dr Dwyane Dee, endocrinology on 05/30/2018 Outpatient Diabetes medications: Invokana 100 mg Daily, Humulin R U-500 45 units breakfast-30 units lunch-15 units supper, Toujeo 60 units bid  Current orders for Inpatient glycemic control:  Levemir20units bid Novolog 0-9 units tid Novolog 0-5 units qhs Novolog 8 units tid  Inpatient Diabetes Program Recommendations:  Consider increasing Levemir to 25 units BID.  Thanks, Tama Headings RN, MSN, BC-ADM Inpatient Diabetes Coordinator Team Pager [phone] (8a-5p)

## 2018-06-20 NOTE — Progress Notes (Signed)
Patient ID: [name], female   DOB: 1965-02-21, 53 y.o.   MRN: [id number]  PROGRESS NOTE    [name]  [id number]:[id number] DOB: November 06, 1965 DOA: 06/01/2018 PCP: [name], PA-C   Brief Narrative:  53 year old female with history of severe COPD on home oxygen, HIV, diabetes mellitus type 2 status post left BKA presented on 06/01/2018 after a mechanical fall and was found to have right bimalleolar fracture with dislocation.  She underwent external fixation on 06/02/2018 by orthopedics.  Assessment & Plan:   Principal Problem:   Displaced bimalleolar fracture of right ankle, closed, initial encounter Active Problems:   Human immunodeficiency virus (HIV) disease ([place])   Diabetes mellitus type 2 with complications, uncontrolled ([place])   COPD (chronic obstructive pulmonary disease) ([place])   Obesity hypoventilation syndrome ([name])   Acute metabolic encephalopathy   Acute on chronic respiratory failure with hypoxia and hypercapnia ([name])   CKD (chronic kidney disease), stage III ([name])  Right bimalleolar ankle fracture/dislocation following a fall -Status post external fixation on 06/02/2018 by orthopedics.  Orthopedics planning to do more surgical intervention next week once swelling improves. -Continue pain management.  Fall precautions.  Acute on chronic hypoxic hypercapnic respiratory failure Acute metabolic encephalopathy -Following surgical intervention, patient became hypoxic and encephalopathic.  Chest x-ray had showed no acute cardiopulmonary findings.  COVID-19 test was negative. -Most likely from obesity hypoventilation syndrome. -Resolved.  Mental status is probably back to baseline. -Currently on oxygen via nasal cannula at 2 L/min.  She apparently uses 3 L of oxygen at home -Monitor mental status.  Diabetes mellitus type 2 with hyperglycemia -Blood sugars on the higher side.  Increase Levemir to 22 units twice daily.  Continue  NovoLog with meals.  Continue CBGs with  SSI  CKD stage III  -Creatinine stable.  Hypokalemia--replace  Hypomagnesemia--replace.  HIV -Continue current antiretroviral regimen.  Follows up with Dr. Johnnye Sima as an outpatient  COPD -Stable.  Continue current regimen.  Generalized deconditioning -PT recommends SNF.  Patient will need to be brought back from SNF for further surgical intervention   DVT prophylaxis: Lovenox Code Status: DNR Family Communication: None at bedside Disposition Plan: SNF once bed is available.  If no SNF bed is available over the next few days, will get in touch with Dr. Modesto Charon regarding timing of inpatient surgery for next week.  Consultants: Orthopedics  Procedures: External fixation of the right lower extremity on 06/02/2018  Antimicrobials: None   Subjective: Patient seen and examined at bedside.  Denies any overnight fever, nausea or vomiting.  No worsening shortness of breath.    Objective: Vitals:   06/19/18 0722 06/19/18 2245 06/20/18 0612 06/20/18 0823  BP:  (!) 129/55 121/61   Pulse:  79 74   Resp:  16 16   Temp:  99 F (37.2 C) 98.7 F (37.1 C)   TempSrc:  Oral Oral   SpO2: 96% 93% 99% 95%  Weight:      Height:        Intake/Output Summary (Last 24 hours) at 06/20/2018 0953 Last data filed at 06/20/2018 0725 Gross per 24 hour  Intake 240 ml  Output 400 ml  Net -160 ml   Filed Weights   06/01/18 2224  Weight: 106.1 kg    Examination:  General exam: No distress.  Poor historian Respiratory system: Bilateral decreased breath sounds at bases, scattered crackles.  No wheezing Cardiovascular system: S1-S2 heard, rate controlled Gastrointestinal system: Abdomen is nondistended, soft and nontender. Normal bowel sounds heard.  Extremities: No cyanosis; left BKA.  Right lower extremity dressing with external fixator present.   Data Reviewed: I have personally reviewed following labs and imaging studies  CBC: Recent Labs  Lab 06/15/18 0858 06/18/18 0404  06/20/18 0510  WBC 12.1* 12.0* 11.5*  NEUTROABS  --   --  8.0*  HGB 8.9* 9.2* 9.6*  HCT 29.3* 30.9* 31.7*  MCV 91.3 92.8 91.4  PLT 346 453* 563*   Basic Metabolic Panel: Recent Labs  Lab 06/15/18 0858 06/16/18 1557 06/18/18 0404 06/20/18 0510  NA 132* 139 138 137  K 3.4* 3.5 3.4* 3.0*  CL 94* 103 101 99  CO2 27 27 27 29   GLUCOSE 308* 296* 294* 242*  BUN 11 8 6 6   CREATININE 0.97 0.98 0.88 1.04*  CALCIUM 8.2* 8.1* 8.4* 8.5*  MG  --   --   --  1.5*   GFR: Estimated Creatinine Clearance: 81.5 mL/min (A) (by C-G formula based on SCr of 1.04 mg/dL (H)). Liver Function Tests: Recent Labs  Lab 06/15/18 0858  AST 15  ALT 18  ALKPHOS 67  BILITOT 0.6  PROT 5.2*  ALBUMIN 2.0*   No results for input(s): LIPASE, AMYLASE in the last 168 hours. No results for input(s): AMMONIA in the last 168 hours. Coagulation Profile: No results for input(s): INR, PROTIME in the last 168 hours. Cardiac Enzymes: No results for input(s): CKTOTAL, CKMB, CKMBINDEX, TROPONINI in the last 168 hours. BNP (last 3 results) No results for input(s): PROBNP in the last 8760 hours. HbA1C: No results for input(s): HGBA1C in the last 72 hours. CBG: Recent Labs  Lab 06/19/18 0838 06/19/18 1312 06/19/18 1838 06/19/18 2114 06/20/18 0904  GLUCAP 178* 178* 256* 258* 226*   Lipid Profile: No results for input(s): CHOL, HDL, LDLCALC, TRIG, CHOLHDL, LDLDIRECT in the last 72 hours. Thyroid Function Tests: No results for input(s): TSH, T4TOTAL, FREET4, T3FREE, THYROIDAB in the last 72 hours. Anemia Panel: No results for input(s): VITAMINB12, FOLATE, FERRITIN, TIBC, IRON, RETICCTPCT in the last 72 hours. Sepsis Labs: No results for input(s): PROCALCITON, LATICACIDVEN in the last 168 hours.  Recent Results (from the past 240 hour(s))  Novel Coronavirus,NAA,(SEND-OUT TO REF LAB - TAT 24-48 hrs); Hosp Order     Status: None   Collection Time: 06/18/18 10:07 AM   Specimen: Nasopharyngeal Swab; Respiratory   Result Value Ref Range Status   SARS-CoV-2, NAA NOT DETECTED NOT DETECTED Final    Comment: (NOTE) This test was developed and its performance characteristics determined by [employer]. This test has not been FDA cleared or approved. This test has been authorized by FDA under an Emergency Use Authorization (EUA). This test is only authorized for the duration of time the declaration that circumstances exist justifying the authorization of the emergency use of in vitro diagnostic tests for detection of SARS-CoV-2 virus and/or diagnosis of COVID-19 infection under section 564(b)(1) of the Act, 21 U.S.C. 875IEP-3(I)(9), unless the authorization is terminated or revoked sooner. When diagnostic testing is negative, the possibility of a false negative result should be considered in the context of a patient's recent exposures and the presence of clinical signs and symptoms consistent with COVID-19. An individual without symptoms of COVID-19 and who is not shedding SARS-CoV-2 virus would expect to have a negative (not detected) result in this assay. Performed  At: [facility] [address] [place], [place] [id number] Rush Farmer MD YT:[id number]    [place]  Final    Comment: Performed at [facility]  Hospital Lab, [place] [address], [place], [place] [address]         Radiology Studies: No results found.      Scheduled Meds: . bictegravir-emtricitabine-tenofovir AF  1 tablet Oral Daily  . enoxaparin (LOVENOX) injection  40 mg Subcutaneous Q24H  . fluticasone  1 spray Each Nare Daily  . fluticasone furoate-vilanterol  1 puff Inhalation Daily   And  . umeclidinium bromide  1 puff Inhalation Daily  . insulin aspart  0-5 Units Subcutaneous QHS  . insulin aspart  0-9 Units Subcutaneous TID WC  . insulin aspart  8 Units Subcutaneous TID WC  . insulin detemir  20 Units Subcutaneous BID  . montelukast  10 mg Oral QHS  . multivitamin with  minerals  1 tablet Oral Daily  . potassium chloride  40 mEq Oral Q4H  . pregabalin  225 mg Oral BID  . Ensure Max Protein  11 oz Oral Daily  . rosuvastatin  20 mg Oral Daily  . senna-docusate  1 tablet Oral BID  . sertraline  200 mg Oral Daily   Continuous Infusions:   LOS: 18 days        Aline August, MD [facility] 06/20/2018, 9:53 AM

## 2018-06-20 NOTE — TOC Progression Note (Signed)
Transition of Care [facility]) - Progression Note    Patient Details  Name: [name] MRN: [id number] Date of Birth: Oct 08, 1965  Transition of Care [facility]) CM/SW [place], LCSW Phone Number: 06/20/2018, 12:38 PM  Clinical Narrative:    Patient has selected [employer]. They will start insurance authorization. Per their liaison, Juliann Pulse, patient will not require another COVID test since she has had two recently.    Expected Discharge Plan: Skilled Nursing Facility Barriers to Discharge: Ship broker, Continued Medical Work up  Expected Discharge Plan and Services Expected Discharge Plan: [place] In-house Referral: Clinical Social Work Discharge Planning Services: NA Post Acute Care Choice: [place] Living arrangements for the past 2 months: Single Family Home                           HH Arranged: NA           Social Determinants of Health (SDOH) Interventions    Readmission Risk Interventions Readmission Risk Prevention Plan 06/17/2018  Transportation Screening Complete  Medication Review Press photographer) Complete  PCP or Specialist appointment within 3-5 days of discharge Complete  HRI or Home Care Consult Complete  SW Recovery Care/Counseling Consult Complete  Palliative Care Screening Not [place] Complete  Some recent data might be hidden

## 2018-06-21 LAB — GLUCOSE, CAPILLARY
Glucose-Capillary: 194 mg/dL — ABNORMAL HIGH (ref 70–99)
Glucose-Capillary: 183 mg/dL — ABNORMAL HIGH (ref 70–99)
Glucose-Capillary: 172 mg/dL — ABNORMAL HIGH (ref 70–99)
Glucose-Capillary: 193 mg/dL — ABNORMAL HIGH (ref 70–99)

## 2018-06-21 NOTE — Plan of Care (Signed)
  Problem: Nutrition: Goal: Adequate nutrition will be maintained Outcome: Progressing   Problem: Activity: Goal: Risk for activity intolerance will decrease Outcome: Progressing   Problem: Pain Managment: Goal: General experience of comfort will improve Outcome: Progressing   Problem: Safety: Goal: Ability to remain free from injury will improve Outcome: Progressing   Problem: Skin Integrity: Goal: Risk for impaired skin integrity will decrease Outcome: Progressing

## 2018-06-21 NOTE — Progress Notes (Signed)
Patient ID: [name], female   DOB: [birth year], 53 y.o.   MRN: [id number]  PROGRESS NOTE    [name]  [id number] DOB: [birth year] DOA: 06/01/2018 PCP: [name], PA-C   Brief Narrative:  53 year old female with history of severe COPD on home oxygen, HIV, diabetes mellitus type 2 status post left BKA presented on 06/01/2018 after a mechanical fall and was found to have right bimalleolar fracture with dislocation.  She underwent external fixation on 06/02/2018 by orthopedics.  Assessment & Plan:   Principal Problem:   Displaced bimalleolar fracture of right ankle, closed, initial encounter Active Problems:   Human immunodeficiency virus (HIV) disease ([place])   Diabetes mellitus type 2 with complications, uncontrolled ([place])   COPD (chronic obstructive pulmonary disease) ([place])   Obesity hypoventilation syndrome ([name])   Acute metabolic encephalopathy   Acute on chronic respiratory failure with hypoxia and hypercapnia ([name])   CKD (chronic kidney disease), stage III ([name])  Right bimalleolar ankle fracture/dislocation following a fall -Status post external fixation on 06/02/2018 by orthopedics.  Orthopedics planning to do more surgical intervention once swelling improves. I had gotten in touch with Dr. Modesto Charon on phone on 06/20/2018 who stated that he would wait for at least 6-8 weeks and would reevaluate as an outpatient for further surgical intervention. -Continue pain management.  Fall precautions.  Acute on chronic hypoxic hypercapnic respiratory failure Acute metabolic encephalopathy -Following surgical intervention, patient became hypoxic and encephalopathic.  Chest x-ray had showed no acute cardiopulmonary findings.  COVID-19 test was negative. -Most likely from obesity hypoventilation syndrome. -Resolved.  Mental status is probably back to baseline. -Currently on oxygen via nasal cannula at 2 L/min.  She apparently uses 3 L of oxygen at home -Monitor mental  status.  Diabetes mellitus type 2 with hyperglycemia -Continue Levemir; continue NovoLog with meals.  Continue CBGs with SSI  CKD stage III  -Creatinine stable.  HIV -Continue current antiretroviral regimen.  Follows up with Dr. Johnnye Sima as an outpatient  COPD -Stable.  Continue current regimen.  Generalized deconditioning -PT recommends SNF.  Patient will need to be brought back from SNF for further surgical intervention   DVT prophylaxis: Lovenox Code Status: DNR Family Communication: None at bedside Disposition Plan: SNF once bed is available.  Consultants: Orthopedics  Procedures: External fixation of the right lower extremity on 06/02/2018  Antimicrobials: None   Subjective: Patient seen and examined at bedside.  No overnight fever, worsening lower extremity pain, vomiting or diarrhea. Objective: Vitals:   06/20/18 0823 06/20/18 1416 06/20/18 2148 06/21/18 0629  BP:  (!) 136/57 (!) 116/53 (!) 119/52  Pulse:  73 79 65  Resp:  16 16 16   Temp:  98.8 F (37.1 C) 98.5 F (36.9 C) 98.9 F (37.2 C)  TempSrc:  Oral  Oral  SpO2: 95% 90% 92% 98%  Weight:      Height:        Intake/Output Summary (Last 24 hours) at 06/21/2018 0748 Last data filed at 06/20/2018 1719 Gross per 24 hour  Intake 390 ml  Output 300 ml  Net 90 ml   Filed Weights   06/01/18 2224  Weight: 106.1 kg    Examination:  General exam: No acute distress.  Poor historian Respiratory system: Bilateral decreased breath sounds at bases, scattered basilar crackles.  Cardiovascular system: Rate controlled, S1-S2 heard  gastrointestinal system: Abdomen is nondistended, soft and nontender. Normal bowel sounds heard. Extremities: No cyanosis; left BKA.  Right lower extremity dressing with external fixator present.  Data Reviewed: I have personally reviewed following labs and imaging studies  CBC: Recent Labs  Lab 06/15/18 0858 06/18/18 0404 06/20/18 0510  WBC 12.1* 12.0* 11.5*  NEUTROABS  --    --  8.0*  HGB 8.9* 9.2* 9.6*  HCT 29.3* 30.9* 31.7*  MCV 91.3 92.8 91.4  PLT 346 453* 741*   Basic Metabolic Panel: Recent Labs  Lab 06/15/18 0858 06/16/18 1557 06/18/18 0404 06/20/18 0510  NA 132* 139 138 137  K 3.4* 3.5 3.4* 3.0*  CL 94* 103 101 99  CO2 27 27 27 29   GLUCOSE 308* 296* 294* 242*  BUN 11 8 6 6   CREATININE 0.97 0.98 0.88 1.04*  CALCIUM 8.2* 8.1* 8.4* 8.5*  MG  --   --   --  1.5*   GFR: Estimated Creatinine Clearance: 81.5 mL/min (A) (by C-G formula based on SCr of 1.04 mg/dL (H)). Liver Function Tests: Recent Labs  Lab 06/15/18 0858  AST 15  ALT 18  ALKPHOS 67  BILITOT 0.6  PROT 5.2*  ALBUMIN 2.0*   No results for input(s): LIPASE, AMYLASE in the last 168 hours. No results for input(s): AMMONIA in the last 168 hours. Coagulation Profile: No results for input(s): INR, PROTIME in the last 168 hours. Cardiac Enzymes: No results for input(s): CKTOTAL, CKMB, CKMBINDEX, TROPONINI in the last 168 hours. BNP (last 3 results) No results for input(s): PROBNP in the last 8760 hours. HbA1C: No results for input(s): HGBA1C in the last 72 hours. CBG: Recent Labs  Lab 06/20/18 0904 06/20/18 1210 06/20/18 1704 06/20/18 2150 06/21/18 0744  GLUCAP 226* 232* 174* 210* 194*   Lipid Profile: No results for input(s): CHOL, HDL, LDLCALC, TRIG, CHOLHDL, LDLDIRECT in the last 72 hours. Thyroid Function Tests: No results for input(s): TSH, T4TOTAL, FREET4, T3FREE, THYROIDAB in the last 72 hours. Anemia Panel: No results for input(s): VITAMINB12, FOLATE, FERRITIN, TIBC, IRON, RETICCTPCT in the last 72 hours. Sepsis Labs: No results for input(s): PROCALCITON, LATICACIDVEN in the last 168 hours.  Recent Results (from the past 240 hour(s))  Novel Coronavirus,NAA,(SEND-OUT TO REF LAB - TAT 24-48 hrs); Hosp Order     Status: None   Collection Time: 06/18/18 10:07 AM   Specimen: Nasopharyngeal Swab; Respiratory  Result Value Ref Range Status   SARS-CoV-2, NAA NOT  DETECTED NOT DETECTED Final    Comment: (NOTE) This test was developed and its performance characteristics determined by [employer]. This test has not been FDA cleared or approved. This test has been authorized by FDA under an Emergency Use Authorization (EUA). This test is only authorized for the duration of time the declaration that circumstances exist justifying the authorization of the emergency use of in vitro diagnostic tests for detection of SARS-CoV-2 virus and/or diagnosis of COVID-19 infection under section 564(b)(1) of the Act, 21 U.S.C. 287OMV-6(H)(2), unless the authorization is terminated or revoked sooner. When diagnostic testing is negative, the possibility of a false negative result should be considered in the context of a patient's recent exposures and the presence of clinical signs and symptoms consistent with COVID-19. An individual without symptoms of COVID-19 and who is not shedding SARS-CoV-2 virus would expect to have a negative (not detected) result in this assay. Performed  At: [facility] [address] [place], [place] [id number] Rush Farmer MD ZM:[id number]    [place]  Final    Comment: Performed at [facility], [place] [address], [place], [place] [address]  Radiology Studies: No results found.      Scheduled Meds: . bictegravir-emtricitabine-tenofovir AF  1 tablet Oral Daily  . enoxaparin (LOVENOX) injection  40 mg Subcutaneous Q24H  . fluticasone  1 spray Each Nare Daily  . fluticasone furoate-vilanterol  1 puff Inhalation Daily   And  . umeclidinium bromide  1 puff Inhalation Daily  . insulin aspart  0-5 Units Subcutaneous QHS  . insulin aspart  0-9 Units Subcutaneous TID WC  . insulin aspart  8 Units Subcutaneous TID WC  . insulin detemir  22 Units Subcutaneous BID  . montelukast  10 mg Oral QHS  . multivitamin with minerals  1 tablet Oral Daily  . pregabalin  225 mg Oral BID   . Ensure Max Protein  11 oz Oral Daily  . rosuvastatin  20 mg Oral Daily  . senna-docusate  1 tablet Oral BID  . sertraline  200 mg Oral Daily   Continuous Infusions:   LOS: 19 days        Aline August, MD [facility] 06/21/2018, 7:48 AM

## 2018-06-22 LAB — GLUCOSE, CAPILLARY
Glucose-Capillary: 237 mg/dL — ABNORMAL HIGH (ref 70–99)
Glucose-Capillary: 176 mg/dL — ABNORMAL HIGH (ref 70–99)
Glucose-Capillary: 139 mg/dL — ABNORMAL HIGH (ref 70–99)
Glucose-Capillary: 162 mg/dL — ABNORMAL HIGH (ref 70–99)

## 2018-06-22 NOTE — Progress Notes (Signed)
Patient ID: [name], female   DOB: 05-Aug-1965, 53 y.o.   MRN: [id number]  PROGRESS NOTE    [name]  PJK:[id number] DOB: 1965/12/10 DOA: 06/01/2018 PCP: [name], PA-C   Brief Narrative:  53 year old female with history of severe COPD on home oxygen, HIV, diabetes mellitus type 2 status post left BKA presented on 06/01/2018 after a mechanical fall and was found to have right bimalleolar fracture with dislocation.  She underwent external fixation on 06/02/2018 by orthopedics.  Assessment & Plan:   Principal Problem:   Displaced bimalleolar fracture of right ankle, closed, initial encounter Active Problems:   Human immunodeficiency virus (HIV) disease ([place])   Diabetes mellitus type 2 with complications, uncontrolled ([place])   COPD (chronic obstructive pulmonary disease) ([place])   Obesity hypoventilation syndrome ([name])   Acute metabolic encephalopathy   Acute on chronic respiratory failure with hypoxia and hypercapnia ([name])   CKD (chronic kidney disease), stage III ([name])  Right bimalleolar ankle fracture/dislocation following a fall -Status post external fixation on 06/02/2018 by orthopedics.  Orthopedics planning to do more surgical intervention once swelling improves. I had gotten in touch with Dr. Modesto Charon on phone on 06/20/2018 who stated that he would wait for at least 6-8 weeks and would reevaluate as an outpatient for further surgical intervention. -Continue pain management.  Fall precautions.  Acute on chronic hypoxic hypercapnic respiratory failure Acute metabolic encephalopathy -Following surgical intervention, patient became hypoxic and encephalopathic.  Chest x-ray had showed no acute cardiopulmonary findings.  COVID-19 test was negative. -Most likely from obesity hypoventilation syndrome. -Resolved.  Mental status is probably back to baseline. -Currently on oxygen via nasal cannula at 2-3L/min.  She apparently uses 3 L of oxygen at home -Monitor mental  status.  Diabetes mellitus type 2 with hyperglycemia -Continue Levemir; continue NovoLog with meals.  Continue CBGs with SSI  CKD stage III  -Creatinine stable.  HIV -Continue current antiretroviral regimen.  Follows up with Dr. Johnnye Sima as an outpatient  COPD -Stable.  Continue current regimen.  Generalized deconditioning -PT recommends SNF.    DVT prophylaxis: Lovenox Code Status: DNR Family Communication: None at bedside Disposition Plan: SNF once bed is available.  Consultants: Orthopedics  Procedures: External fixation of the right lower extremity on 06/02/2018  Antimicrobials: None   Subjective: Patient seen and examined at bedside.  She denies any overnight fever, abdominal pain, vomiting or diarrhea.   Objective: Vitals:   06/21/18 0629 06/21/18 0834 06/21/18 1328 06/21/18 2052  BP: (!) 119/52  (!) 120/2 120/61  Pulse: 65  68 73  Resp: 16  17 14   Temp: 98.9 F (37.2 C)  98.9 F (37.2 C) 98.4 F (36.9 C)  TempSrc: Oral  Oral Oral  SpO2: 98% 98% 98% 96%  Weight:      Height:       No intake or output data in the 24 hours ending 06/22/18 0758 Filed Weights   06/01/18 2224  Weight: 106.1 kg    Examination:  General exam: No distress.  Appears older than stated age.  Poor historian.  Respiratory system: Bilateral decreased breath sounds at bases, scattered basilar crackles.  No wheezing Cardiovascular system: Rate controlled, S1-S2 heard  gastrointestinal system: Abdomen is nondistended, soft and nontender. Normal bowel sounds heard. Extremities: No cyanosis; left BKA.  Right lower extremity dressing with external fixator present.   Data Reviewed: I have personally reviewed following labs and imaging studies  CBC: Recent Labs  Lab 06/15/18 0858 06/18/18 0404 06/20/18 0510  WBC 12.1* 12.0* 11.5*  NEUTROABS  --   --  8.0*  HGB 8.9* 9.2* 9.6*  HCT 29.3* 30.9* 31.7*  MCV 91.3 92.8 91.4  PLT 346 453* 409*   Basic Metabolic Panel: Recent Labs  Lab  06/15/18 0858 06/16/18 1557 06/18/18 0404 06/20/18 0510  NA 132* 139 138 137  K 3.4* 3.5 3.4* 3.0*  CL 94* 103 101 99  CO2 27 27 27 29   GLUCOSE 308* 296* 294* 242*  BUN 11 8 6 6   CREATININE 0.97 0.98 0.88 1.04*  CALCIUM 8.2* 8.1* 8.4* 8.5*  MG  --   --   --  1.5*   GFR: Estimated Creatinine Clearance: 81.5 mL/min (A) (by C-G formula based on SCr of 1.04 mg/dL (H)). Liver Function Tests: Recent Labs  Lab 06/15/18 0858  AST 15  ALT 18  ALKPHOS 67  BILITOT 0.6  PROT 5.2*  ALBUMIN 2.0*   No results for input(s): LIPASE, AMYLASE in the last 168 hours. No results for input(s): AMMONIA in the last 168 hours. Coagulation Profile: No results for input(s): INR, PROTIME in the last 168 hours. Cardiac Enzymes: No results for input(s): CKTOTAL, CKMB, CKMBINDEX, TROPONINI in the last 168 hours. BNP (last 3 results) No results for input(s): PROBNP in the last 8760 hours. HbA1C: No results for input(s): HGBA1C in the last 72 hours. CBG: Recent Labs  Lab 06/20/18 2150 06/21/18 0744 06/21/18 1203 06/21/18 1659 06/21/18 2053  GLUCAP 210* 194* 183* 172* 193*   Lipid Profile: No results for input(s): CHOL, HDL, LDLCALC, TRIG, CHOLHDL, LDLDIRECT in the last 72 hours. Thyroid Function Tests: No results for input(s): TSH, T4TOTAL, FREET4, T3FREE, THYROIDAB in the last 72 hours. Anemia Panel: No results for input(s): VITAMINB12, FOLATE, FERRITIN, TIBC, IRON, RETICCTPCT in the last 72 hours. Sepsis Labs: No results for input(s): PROCALCITON, LATICACIDVEN in the last 168 hours.  Recent Results (from the past 240 hour(s))  Novel Coronavirus,NAA,(SEND-OUT TO REF LAB - TAT 24-48 hrs); Hosp Order     Status: None   Collection Time: 06/18/18 10:07 AM   Specimen: Nasopharyngeal Swab; Respiratory  Result Value Ref Range Status   SARS-CoV-2, NAA NOT DETECTED NOT DETECTED Final    Comment: (NOTE) This test was developed and its performance characteristics determined by [employer]. This test has not been FDA cleared or approved. This test has been authorized by FDA under an Emergency Use Authorization (EUA). This test is only authorized for the duration of time the declaration that circumstances exist justifying the authorization of the emergency use of in vitro diagnostic tests for detection of SARS-CoV-2 virus and/or diagnosis of COVID-19 infection under section 564(b)(1) of the Act, 21 U.S.C. 811BJY-7(W)(2), unless the authorization is terminated or revoked sooner. When diagnostic testing is negative, the possibility of a false negative result should be considered in the context of a patient's recent exposures and the presence of clinical signs and symptoms consistent with COVID-19. An individual without symptoms of COVID-19 and who is not shedding SARS-CoV-2 virus would expect to have a negative (not detected) result in this assay. Performed  At: [facility] [address] [place], [place] [id number] Rush Farmer MD VH:[id number]    [place]  Final    Comment: Performed at [facility], [place] [address] [place],  [address]         Radiology Studies: No results found.      Scheduled Meds: . bictegravir-emtricitabine-tenofovir AF  1 tablet Oral Daily  . enoxaparin (  LOVENOX) injection  40 mg Subcutaneous Q24H  . fluticasone  1 spray Each Nare Daily  . fluticasone furoate-vilanterol  1 puff Inhalation Daily   And  . umeclidinium bromide  1 puff Inhalation Daily  . insulin aspart  0-5 Units Subcutaneous QHS  . insulin aspart  0-9 Units Subcutaneous TID WC  . insulin aspart  8 Units Subcutaneous TID WC  . insulin detemir  22 Units Subcutaneous BID  . montelukast  10 mg Oral QHS  . multivitamin with minerals  1 tablet Oral Daily  . pregabalin  225 mg Oral BID  . Ensure Max Protein  11 oz Oral Daily  . rosuvastatin  20 mg Oral Daily  . senna-docusate  1 tablet Oral BID  . sertraline   200 mg Oral Daily   Continuous Infusions:   LOS: 20 days        Aline August, MD [facility] 06/22/2018, 7:58 AM

## 2018-06-23 LAB — GLUCOSE, CAPILLARY
Glucose-Capillary: 191 mg/dL — ABNORMAL HIGH (ref 70–99)
Glucose-Capillary: 153 mg/dL — ABNORMAL HIGH (ref 70–99)
Glucose-Capillary: 230 mg/dL — ABNORMAL HIGH (ref 70–99)

## 2018-06-23 MED ORDER — HUMULIN R U-500 KWIKPEN 500 UNIT/ML ~~LOC~~ SOPN: 10.0000 [IU] | PEN_INJECTOR | Freq: Three times a day (TID) | SUBCUTANEOUS | 0 refills | Status: DC

## 2018-06-23 MED ORDER — TOUJEO MAX SOLOSTAR 300 UNIT/ML ~~LOC~~ SOPN: 22.0000 [IU] | PEN_INJECTOR | Freq: Two times a day (BID) | SUBCUTANEOUS | Status: DC

## 2018-06-23 MED ORDER — POLYETHYLENE GLYCOL 3350 17 G PO PACK: 17.0000 g | PACK | Freq: Every day | ORAL | 0 refills | Status: DC | PRN

## 2018-06-23 MED ORDER — HYDROCODONE-ACETAMINOPHEN 10-325 MG PO TABS: 1.0000 | ORAL_TABLET | Freq: Four times a day (QID) | ORAL | 0 refills | Status: DC | PRN

## 2018-06-23 MED ORDER — SENNOSIDES-DOCUSATE SODIUM 8.6-50 MG PO TABS: 1.0000 | ORAL_TABLET | Freq: Two times a day (BID) | ORAL | 0 refills | Status: DC

## 2018-06-23 NOTE — Progress Notes (Signed)
Dressing on RLE was changed per order; patient tolerated well. Will continue to monitor.
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
## 2018-06-23 NOTE — TOC Transition Note (Addendum)
Transition of Care [facility]) - CM/SW Discharge Note   Patient Details  Name: [name] MRN: [id number] Date of Birth: May 07, 1965  Transition of Care [facility]) CM/SW Contact:  Benard Halsted, LCSW Phone Number: 06/23/2018, 4:31 PM   Clinical Narrative:    Patient will DC to: [place] Anticipated DC date: 06/23/18 Family notified: Left vm for Daughter in law, [name]. Patient oriented. Transport by: Corey Harold 6:30pm   Per MD patient ready for DC to [employer] [facility]. RN, patient, patient's family, and facility notified of DC. Discharge Summary and FL2 sent to facility. RN to call report prior to discharge [phone] Room 106). DC packet on chart. Ambulance transport requested for patient.   CSW will sign off for now as social work intervention is no longer needed. Please consult [place] again if new needs arise.  Cedric Fishman, LCSW Clinical Social Worker [phone]    Final next level of care: Skilled Nursing Facility Barriers to Discharge: No Barriers Identified   Patient Goals and CMS Choice Patient states their goals for this hospitalization and ongoing recovery are:: Rehab CMS Medicare.gov Compare Post Acute Care list provided to:: Patient Choice offered to / list presented to : Patient  Discharge Placement   Existing PASRR number confirmed : 06/23/18          Patient [facility] bed at: [facility] Patient to be transferred to facility by: [place] Name of family member notified: [name], daughter in law Patient and family notified of of transfer: 06/23/18  Discharge Plan and Services In-house Referral: Clinical Social Work Discharge Planning Services: NA Post Acute Care Choice: Skilled Nursing Facility                    HH Arranged: NA          Social Determinants of Health (SDOH) Interventions     Readmission Risk Interventions Readmission Risk Prevention Plan 06/17/2018  Transportation Screening Complete  Medication Review Press photographer)  Complete  PCP or Specialist appointment within 3-5 days of discharge Complete  HRI or Home Care Consult Complete  SW Recovery Care/Counseling Consult Complete  [place] Complete  Some recent data might be hidden

## 2018-06-23 NOTE — Progress Notes (Signed)
Patient was discharged to nursing home [facility]) by MD order; discharged instructions review and sent to facility via PTAR with care notes and prescriptions; Left IJ was removed by IV nurse; home meds were delivered back from pharmacy in the patient's room; facility was called and report was given to the nurse who is going to receive the patient; patient will be transported to facility via [place].

## 2018-06-23 NOTE — Care Management Important Message (Signed)
Important Message  Patient Details  Name: [name] MRN: [id number] Date of Birth: [birth year]   Medicare Important Message Given:  Yes     Adem Costlow Montine Circle 06/23/2018, 2:37 PM

## 2018-06-23 NOTE — Progress Notes (Signed)
Patient ID: [name], female   DOB: 1965/01/19, 53 y.o.   MRN: [id number]  PROGRESS NOTE    [name]  INO:[id number] DOB: 1965-10-17 DOA: 06/01/2018 PCP: [name], PA-C   Brief Narrative:  53 year old female with history of severe COPD on home oxygen, HIV, diabetes mellitus type 2 status post left BKA presented on 06/01/2018 after a mechanical fall and was found to have right bimalleolar fracture with dislocation.  She underwent external fixation on 06/02/2018 by orthopedics.  Assessment & Plan:   Principal Problem:   Displaced bimalleolar fracture of right ankle, closed, initial encounter Active Problems:   Human immunodeficiency virus (HIV) disease ([place])   Diabetes mellitus type 2 with complications, uncontrolled ([place])   COPD (chronic obstructive pulmonary disease) ([place])   Obesity hypoventilation syndrome ([name])   Acute metabolic encephalopathy   Acute on chronic respiratory failure with hypoxia and hypercapnia ([name])   CKD (chronic kidney disease), stage III ([name])  Right bimalleolar ankle fracture/dislocation following a fall -Status post external fixation on 06/02/2018 by orthopedics.  Orthopedics planning to do more surgical intervention once swelling improves. I had gotten in touch with Dr. Modesto Charon on phone on 06/20/2018 who stated that he would wait for at least 6-8 weeks and would reevaluate as an outpatient for further surgical intervention. -Continue pain management.  Fall precautions.  Acute on chronic hypoxic hypercapnic respiratory failure Acute metabolic encephalopathy -Following surgical intervention, patient became hypoxic and encephalopathic.  Chest x-ray had showed no acute cardiopulmonary findings.  COVID-19 test was negative. -Most likely from obesity hypoventilation syndrome. -Resolved.  Mental status is probably back to baseline. -Currently on oxygen via nasal cannula at 4 L/min.  She apparently uses 3 L of oxygen at home -Monitor mental  status.  Diabetes mellitus type 2 with hyperglycemia -Continue Levemir; continue NovoLog with meals.  Continue CBGs with SSI  CKD stage III  -Creatinine stable.  HIV -Continue current antiretroviral regimen.  Follows up with Dr. Johnnye Sima as an outpatient  COPD -Stable.  Continue current regimen.  Generalized deconditioning -PT recommends SNF.    DVT prophylaxis: Lovenox Code Status: DNR Family Communication: None at bedside Disposition Plan: SNF once bed is available.  Consultants: Orthopedics  Procedures: External fixation of the right lower extremity on 06/02/2018  Antimicrobials: None   Subjective: Patient seen and examined at bedside.  No new complaints.  No overnight fever or vomiting.  Objective: Vitals:   06/22/18 0844 06/22/18 2107 06/23/18 0543 06/23/18 0910  BP:  125/63 (!) 121/55   Pulse:  80 81   Resp:  16 16   Temp:  98.5 F (36.9 C) 98.7 F (37.1 C)   TempSrc:  Oral Oral   SpO2: 97% 94% 94% 96%  Weight:      Height:        Intake/Output Summary (Last 24 hours) at 06/23/2018 1342 Last data filed at 06/23/2018 1149 Gross per 24 hour  Intake 260 ml  Output 1800 ml  Net -1540 ml   Filed Weights   06/01/18 2224  Weight: 106.1 kg    Examination:  General exam: No acute distress.  Appears older than stated age.  Poor historian.  Respiratory system: Bilateral decreased breath sounds at bases, scattered basilar crackles.  No wheezing Cardiovascular system: Rate controlled, S1-S2 heard  gastrointestinal system: Abdomen is nondistended, soft and nontender. Normal bowel sounds heard. Extremities: No cyanosis; left BKA.  Right lower extremity dressing with external fixator present.   Data Reviewed: I have personally reviewed following  labs and imaging studies  CBC: Recent Labs  Lab 06/18/18 0404 06/20/18 0510  WBC 12.0* 11.5*  NEUTROABS  --  8.0*  HGB 9.2* 9.6*  HCT 30.9* 31.7*  MCV 92.8 91.4  PLT 453* 710*   Basic Metabolic Panel: Recent  Labs  Lab 06/16/18 1557 06/18/18 0404 06/20/18 0510  NA 139 138 137  K 3.5 3.4* 3.0*  CL 103 101 99  CO2 27 27 29   GLUCOSE 296* 294* 242*  BUN 8 6 6   CREATININE 0.98 0.88 1.04*  CALCIUM 8.1* 8.4* 8.5*  MG  --   --  1.5*   GFR: Estimated Creatinine Clearance: 81.5 mL/min (A) (by C-G formula based on SCr of 1.04 mg/dL (H)). Liver Function Tests: No results for input(s): AST, ALT, ALKPHOS, BILITOT, PROT, ALBUMIN in the last 168 hours. No results for input(s): LIPASE, AMYLASE in the last 168 hours. No results for input(s): AMMONIA in the last 168 hours. Coagulation Profile: No results for input(s): INR, PROTIME in the last 168 hours. Cardiac Enzymes: No results for input(s): CKTOTAL, CKMB, CKMBINDEX, TROPONINI in the last 168 hours. BNP (last 3 results) No results for input(s): PROBNP in the last 8760 hours. HbA1C: No results for input(s): HGBA1C in the last 72 hours. CBG: Recent Labs  Lab 06/22/18 1208 06/22/18 1637 06/22/18 2111 06/23/18 0755 06/23/18 1158  GLUCAP 176* 139* 162* 191* 153*   Lipid Profile: No results for input(s): CHOL, HDL, LDLCALC, TRIG, CHOLHDL, LDLDIRECT in the last 72 hours. Thyroid Function Tests: No results for input(s): TSH, T4TOTAL, FREET4, T3FREE, THYROIDAB in the last 72 hours. Anemia Panel: No results for input(s): VITAMINB12, FOLATE, FERRITIN, TIBC, IRON, RETICCTPCT in the last 72 hours. Sepsis Labs: No results for input(s): PROCALCITON, LATICACIDVEN in the last 168 hours.  Recent Results (from the past 240 hour(s))  Novel Coronavirus,NAA,(SEND-OUT TO REF LAB - TAT 24-48 hrs); Hosp Order     Status: None   Collection Time: 06/18/18 10:07 AM   Specimen: Nasopharyngeal Swab; Respiratory  Result Value Ref Range Status   SARS-CoV-2, NAA NOT DETECTED NOT DETECTED Final    Comment: (NOTE) This test was developed and its performance characteristics determined by [employer]. This test has not been FDA cleared or approved. This test  has been authorized by FDA under an Emergency Use Authorization (EUA). This test is only authorized for the duration of time the declaration that circumstances exist justifying the authorization of the emergency use of in vitro diagnostic tests for detection of SARS-CoV-2 virus and/or diagnosis of COVID-19 infection under section 564(b)(1) of the Act, 21 U.S.C. 626RSW-5(I)(6), unless the authorization is terminated or revoked sooner. When diagnostic testing is negative, the possibility of a false negative result should be considered in the context of a patient's recent exposures and the presence of clinical signs and symptoms consistent with COVID-19. An individual without symptoms of COVID-19 and who is not shedding SARS-CoV-2 virus would expect to have a negative (not detected) result in this assay. Performed  At: [facility] [address] [place], [place] [id number] Rush Farmer MD GH:[id number]    [place]  Final    Comment: Performed at [facility], [place] [address] [place], [place] [address]         Radiology Studies: No results found.      Scheduled Meds: . bictegravir-emtricitabine-tenofovir AF  1 tablet Oral Daily  . enoxaparin (LOVENOX) injection  40 mg Subcutaneous Q24H  . fluticasone  1 spray Each Nare Daily  .  fluticasone furoate-vilanterol  1 puff Inhalation Daily   And  . umeclidinium bromide  1 puff Inhalation Daily  . insulin aspart  0-5 Units Subcutaneous QHS  . insulin aspart  0-9 Units Subcutaneous TID WC  . insulin aspart  8 Units Subcutaneous TID WC  . insulin detemir  22 Units Subcutaneous BID  . montelukast  10 mg Oral QHS  . multivitamin with minerals  1 tablet Oral Daily  . pregabalin  225 mg Oral BID  . Ensure Max Protein  11 oz Oral Daily  . rosuvastatin  20 mg Oral Daily  . senna-docusate  1 tablet Oral BID  . sertraline  200 mg Oral Daily   Continuous Infusions:   LOS: 21 days         Aline August, MD [facility] 06/23/2018, 1:42 PM

## 2018-06-23 NOTE — Progress Notes (Signed)
Patient didn't want staff to call her family and informed them about her condition. She said she already talk with her family. Will continue to monitor.

## 2018-06-23 NOTE — Progress Notes (Signed)
Patient is complaining of pain in her RLE but it is more intense in right calf than before. MD notified.

## 2018-06-23 NOTE — Progress Notes (Signed)
Pt left the floor at 2032 via PTAR. Alert and oriented x4, no signs of distress, Temp 98.7, BP 132/74, HR 79, SpO2 94 on RA. Pt belongings sent with pt and all questions answered.

## 2018-06-23 NOTE — Progress Notes (Signed)
PT Cancellation Note  Patient Details Name: [name] MRN: [id number] DOB: 14-Aug-1965   Cancelled Treatment:     Patient declined therapy. She reports increased pain in her right calf over the past hour or two. She reports she will participate tomorrow.    Carney Living PT DPT  06/23/2018, 4:09 PM

## 2018-06-23 NOTE — Consult Note (Signed)
   [facility] CM Inpatient Consult   06/23/2018  [name] 1965-10-24 [id number]    Follow-up Note:  Following patient's disposition and noted that she is being recommended for skilled nursing facility per therapy recommendation. Review of transition of care CM/ SW note, patient has selected [facility], with pending insurance approval.  Primary Care Provider listed is [name], [place] with [place] at [facility], listed to provide transition of care follow up.  If there are any changes with disposition and needs, please refer to [facility] care management for follow-up as appropriate.   For questions and additional information, please call:  Vang Kraeger A. Zainab Crumrine, BSN, RN-BC [facility] Liaison Cell: [phone]

## 2018-06-23 NOTE — Progress Notes (Signed)
DR. Erlinda Hong was informed about patient's complain of more intense pain in right calf. Will continue to monitor.

## 2018-06-23 NOTE — TOC Progression Note (Signed)
Transition of Care [facility]) - Progression Note    Patient Details  Name: [name] MRN: [id number] Date of Birth: January 23, 1965  Transition of Care [facility]) CM/SW [place], LCSW Phone Number: 06/23/2018, 3:44 PM  Clinical Narrative:    [facility] [facility] has received insurance authorization for patient to discharge. CSW notified patient. She requested to discharge tomorrow instead, but CSW explained that she has been medically stable to discharge but we have only been waiting on insurance approval. She reports being unhappy with that decision but states understanding.    Expected Discharge Plan: Skilled Nursing Facility Barriers to Discharge: Ship broker, Continued Medical Work up  Expected Discharge Plan and Services Expected Discharge Plan: [place] In-house Referral: Clinical Social Work Discharge Planning Services: NA Post Acute Care Choice: [place] Living arrangements for the past 2 months: Single Family Home                           HH Arranged: NA           Social Determinants of Health (SDOH) Interventions    Readmission Risk Interventions Readmission Risk Prevention Plan 06/17/2018  Transportation Screening Complete  Medication Review Press photographer) Complete  PCP or Specialist appointment within 3-5 days of discharge Complete  HRI or Home Care Consult Complete  SW Recovery Care/Counseling Consult Complete  Palliative Care Screening Not [place] Complete  Some recent data might be hidden

## 2018-06-23 NOTE — Progress Notes (Signed)
Nutrition Follow-up  RD working remotely.  DOCUMENTATION CODES:   Obesity unspecified  INTERVENTION:   -Continue MVI with minerals daily -Continue Ensure Max po daily, each supplement provides 150 kcal and 30 grams of protein.   NUTRITION DIAGNOSIS:   Increased nutrient needs related to post-op healing as evidenced by estimated needs.  Ongoing  GOAL:   Patient will meet greater than or equal to 90% of their needs  Progressing  MONITOR:   PO intake, Supplement acceptance, Labs, Weight trends, Skin, I & O's  REASON FOR ASSESSMENT:   LOS    ASSESSMENT:   [name] is a 53 y.o. female with medical history significant of severe COPD on home O2, HVOS, CKD, HIV, DM2 s/p L BKA.  6/1- s/p PROCEDURE: External fixationright lower extremity, [id number] multiplane  Reviewed I/O's: -1.8 L x 24 hours and +1.7 L since 06/09/18  UOP: 1.8 L x 24 hours  Per orthopedics notes, plan for continued external fixation until swelling decreased and complete ORIF. Per MD notes, may discharge to SNF and return for surgery depending on timing of surgery.   Pt's appetite remains stable. Noted meal completion 0-100%, but averaging between 50-80% at most meals. Pt is compliant with MVI and Ensure Max supplements.   Labs reviewed: CBGS: 139-191 (inpatient orders for glycemic control are 0-5 units insulin aspart q HS, 0-9 units insulin aspart TID with meals, 8 units insulin aspart TID with meals, and 22 units insulin detemir BID).   Diet Order:   Diet Order            Diet Carb Modified Fluid consistency: Thin; Room service appropriate? Yes  Diet effective now              EDUCATION NEEDS:   No education needs have been identified at this time  Skin:  Skin Assessment: Skin Integrity Issues: Skin Integrity Issues:: Incisions, Other (Comment) Incisions: closed rt ankle Other: MASD bilateral buttocks  Last BM:  06/23/18  Height:   Ht Readings from Last 1 Encounters:   06/01/18 5\' 10"  (1.778 m)    Weight:   Wt Readings from Last 1 Encounters:  06/01/18 106.1 kg    Ideal Body Weight:  68 kg(adjusted for lt BKA)  BMI:  Body mass index is 33.58 kg/m.  Estimated Nutritional Needs:   Kcal:  1800-2000  Protein:  100-115 grams  Fluid:  > 1.8 L    Yehya Brendle A. Jimmye Norman, RD, LDN, [place] Registered Dietitian II Certified Diabetes Care and Education Specialist Pager: [phone] After hours Pager: [phone]

## 2018-06-25 ENCOUNTER — Telehealth: Payer: Self-pay | Admitting: Endocrinology

## 2018-06-25 NOTE — Telephone Encounter (Signed)
Patient called stating that she is at [facility] due to her ankle but states her blood sugars are running high. They are running between 300/450 and patient believes she is not being given enough Insulin Glargine, 2 Unit Dial, (TOUJEO MAX SOLOSTAR) 300 UNIT/ML SOPN.  Please Advise, Thanks

## 2018-06-25 NOTE — Telephone Encounter (Signed)
Please review pt concern and advise 

## 2018-06-25 NOTE — Telephone Encounter (Signed)
Attempted to return pt call to inquire further. Mailbox is full and unable to LVM to request a returned call.

## 2018-06-25 NOTE — Telephone Encounter (Signed)
SECOND ATTEMPT  Called pt again to inquire further. Still remain unsuccessful in my attempt.

## 2018-06-25 NOTE — Telephone Encounter (Signed)
She needs to find out what insulin doses she is getting and the actual blood sugars for the last 2 or 3 days

## 2018-06-25 NOTE — Telephone Encounter (Signed)
FINAL ATTEMPT  Called pt again but still not having success with reaching pt. Should pt call again, will address her concerns then.

## 2018-07-01 ENCOUNTER — Encounter: Payer: Self-pay | Admitting: Orthopaedic Surgery

## 2018-07-01 ENCOUNTER — Observation Stay (HOSPITAL_COMMUNITY): Payer: Medicare HMO | Admitting: Certified Registered Nurse Anesthetist

## 2018-07-01 ENCOUNTER — Ambulatory Visit (INDEPENDENT_AMBULATORY_CARE_PROVIDER_SITE_OTHER): Payer: Medicare HMO

## 2018-07-01 ENCOUNTER — Ambulatory Visit (INDEPENDENT_AMBULATORY_CARE_PROVIDER_SITE_OTHER): Payer: Medicare HMO | Admitting: Orthopaedic Surgery

## 2018-07-01 ENCOUNTER — Encounter (HOSPITAL_COMMUNITY): Payer: Self-pay | Admitting: General Practice

## 2018-07-01 ENCOUNTER — Inpatient Hospital Stay (HOSPITAL_COMMUNITY)
Admission: AD | Admit: 2018-07-01 | Discharge: 2018-07-04 | DRG: 493 | Disposition: A | Discharge status: Skilled Nursing Facility | Payer: Medicare HMO | Source: Ambulatory Visit | Attending: Family Medicine | Admitting: Family Medicine

## 2018-07-01 ENCOUNTER — Other Ambulatory Visit: Payer: Self-pay

## 2018-07-01 ENCOUNTER — Encounter (HOSPITAL_COMMUNITY)
Admission: AD | Disposition: A | Discharge status: Skilled Nursing Facility | Payer: Self-pay | Source: Ambulatory Visit | Attending: Family Medicine

## 2018-07-01 ENCOUNTER — Observation Stay (HOSPITAL_COMMUNITY): Payer: Medicare HMO

## 2018-07-01 DIAGNOSIS — Z794 Long term (current) use of insulin: Secondary | ICD-10-CM

## 2018-07-01 DIAGNOSIS — J9611 Chronic respiratory failure with hypoxia: Secondary | ICD-10-CM | POA: Diagnosis present

## 2018-07-01 DIAGNOSIS — G894 Chronic pain syndrome: Secondary | ICD-10-CM | POA: Diagnosis present

## 2018-07-01 DIAGNOSIS — E1122 Type 2 diabetes mellitus with diabetic chronic kidney disease: Secondary | ICD-10-CM | POA: Diagnosis present

## 2018-07-01 DIAGNOSIS — W19XXXS Unspecified fall, sequela: Secondary | ICD-10-CM | POA: Diagnosis present

## 2018-07-01 DIAGNOSIS — S82841D Displaced bimalleolar fracture of right lower leg, subsequent encounter for closed fracture with routine healing: Secondary | ICD-10-CM | POA: Diagnosis not present

## 2018-07-01 DIAGNOSIS — I1310 Hypertensive heart and chronic kidney disease without heart failure, with stage 1 through stage 4 chronic kidney disease, or unspecified chronic kidney disease: Secondary | ICD-10-CM | POA: Diagnosis present

## 2018-07-01 DIAGNOSIS — Z7951 Long term (current) use of inhaled steroids: Secondary | ICD-10-CM

## 2018-07-01 DIAGNOSIS — S82841A Displaced bimalleolar fracture of right lower leg, initial encounter for closed fracture: Secondary | ICD-10-CM | POA: Diagnosis not present

## 2018-07-01 DIAGNOSIS — E785 Hyperlipidemia, unspecified: Secondary | ICD-10-CM | POA: Diagnosis present

## 2018-07-01 DIAGNOSIS — S82891S Other fracture of right lower leg, sequela: Secondary | ICD-10-CM

## 2018-07-01 DIAGNOSIS — T84328A Displacement of other bone devices, implants and grafts, initial encounter: Principal | ICD-10-CM | POA: Diagnosis present

## 2018-07-01 DIAGNOSIS — M810 Age-related osteoporosis without current pathological fracture: Secondary | ICD-10-CM | POA: Diagnosis present

## 2018-07-01 DIAGNOSIS — N189 Chronic kidney disease, unspecified: Secondary | ICD-10-CM | POA: Diagnosis present

## 2018-07-01 DIAGNOSIS — Z9981 Dependence on supplemental oxygen: Secondary | ICD-10-CM

## 2018-07-01 DIAGNOSIS — I252 Old myocardial infarction: Secondary | ICD-10-CM

## 2018-07-01 DIAGNOSIS — Z821 Family history of blindness and visual loss: Secondary | ICD-10-CM

## 2018-07-01 DIAGNOSIS — IMO0002 Reserved for concepts with insufficient information to code with codable children: Secondary | ICD-10-CM | POA: Diagnosis present

## 2018-07-01 DIAGNOSIS — Z419 Encounter for procedure for purposes other than remedying health state, unspecified: Secondary | ICD-10-CM

## 2018-07-01 DIAGNOSIS — Z89512 Acquired absence of left leg below knee: Secondary | ICD-10-CM

## 2018-07-01 DIAGNOSIS — J449 Chronic obstructive pulmonary disease, unspecified: Secondary | ICD-10-CM | POA: Diagnosis present

## 2018-07-01 DIAGNOSIS — Z8249 Family history of ischemic heart disease and other diseases of the circulatory system: Secondary | ICD-10-CM

## 2018-07-01 DIAGNOSIS — E1165 Type 2 diabetes mellitus with hyperglycemia: Secondary | ICD-10-CM | POA: Diagnosis present

## 2018-07-01 DIAGNOSIS — Z833 Family history of diabetes mellitus: Secondary | ICD-10-CM

## 2018-07-01 DIAGNOSIS — S82891A Other fracture of right lower leg, initial encounter for closed fracture: Secondary | ICD-10-CM | POA: Diagnosis present

## 2018-07-01 DIAGNOSIS — Z87891 Personal history of nicotine dependence: Secondary | ICD-10-CM

## 2018-07-01 DIAGNOSIS — Z4789 Encounter for other orthopedic aftercare: Secondary | ICD-10-CM

## 2018-07-01 DIAGNOSIS — K219 Gastro-esophageal reflux disease without esophagitis: Secondary | ICD-10-CM | POA: Diagnosis present

## 2018-07-01 DIAGNOSIS — E1140 Type 2 diabetes mellitus with diabetic neuropathy, unspecified: Secondary | ICD-10-CM | POA: Diagnosis present

## 2018-07-01 DIAGNOSIS — Z1159 Encounter for screening for other viral diseases: Secondary | ICD-10-CM

## 2018-07-01 DIAGNOSIS — Z21 Asymptomatic human immunodeficiency virus [HIV] infection status: Secondary | ICD-10-CM | POA: Diagnosis present

## 2018-07-01 DIAGNOSIS — Z6833 Body mass index (BMI) 33.0-33.9, adult: Secondary | ICD-10-CM

## 2018-07-01 DIAGNOSIS — D638 Anemia in other chronic diseases classified elsewhere: Secondary | ICD-10-CM | POA: Diagnosis present

## 2018-07-01 DIAGNOSIS — N179 Acute kidney failure, unspecified: Secondary | ICD-10-CM | POA: Diagnosis present

## 2018-07-01 DIAGNOSIS — D62 Acute posthemorrhagic anemia: Secondary | ICD-10-CM | POA: Diagnosis not present

## 2018-07-01 DIAGNOSIS — B20 Human immunodeficiency virus [HIV] disease: Secondary | ICD-10-CM | POA: Diagnosis present

## 2018-07-01 DIAGNOSIS — E669 Obesity, unspecified: Secondary | ICD-10-CM | POA: Diagnosis present

## 2018-07-01 DIAGNOSIS — N182 Chronic kidney disease, stage 2 (mild): Secondary | ICD-10-CM | POA: Diagnosis present

## 2018-07-01 HISTORY — PX: I&D EXTREMITY: SHX5045

## 2018-07-01 HISTORY — PX: EXTERNAL FIXATION REMOVAL: SHX5040

## 2018-07-01 LAB — CBC
HCT: 37.2 % (ref 36.0–46.0)
Hemoglobin: 11.2 g/dL — ABNORMAL LOW (ref 12.0–15.0)
MCH: 26.5 pg (ref 26.0–34.0)
MCHC: 30.1 g/dL (ref 30.0–36.0)
MCV: 87.9 fL (ref 80.0–100.0)
Platelets: 348 10*3/uL (ref 150–400)
RBC: 4.23 MIL/uL (ref 3.87–5.11)
RDW: 18.4 % — ABNORMAL HIGH (ref 11.5–15.5)
WBC: 12.5 10*3/uL — ABNORMAL HIGH (ref 4.0–10.5)
nRBC: 0 % (ref 0.0–0.2)

## 2018-07-01 LAB — CREATININE, SERUM
Creatinine, Ser: 1.01 mg/dL — ABNORMAL HIGH (ref 0.44–1.00)
GFR calc Af Amer: >60 mL/min (ref >60)
GFR calc non Af Amer: >60 mL/min (ref >60)

## 2018-07-01 LAB — GLUCOSE, CAPILLARY
Glucose-Capillary: 187 mg/dL — ABNORMAL HIGH (ref 70–99)
Glucose-Capillary: 230 mg/dL — ABNORMAL HIGH (ref 70–99)

## 2018-07-01 LAB — SARS CORONAVIRUS 2 BY RT PCR (HOSPITAL ORDER, PERFORMED IN ~~LOC~~ HOSPITAL LAB): SARS Coronavirus 2: NEGATIVE

## 2018-07-01 SURGERY — IRRIGATION AND DEBRIDEMENT EXTREMITY
Anesthesia: General | Laterality: Right

## 2018-07-01 MED ORDER — OXYCODONE HCL 5 MG PO TABS
ORAL_TABLET | ORAL | Status: AC
  Administered 2018-07-01: 5 mg
  Filled 2018-07-01: qty 1

## 2018-07-01 MED ORDER — DIPHENHYDRAMINE HCL 12.5 MG/5ML PO ELIX
25.0000 mg | ORAL_SOLUTION | ORAL | Status: DC | PRN
  Administered 2018-07-02 – 2018-07-03 (×3): 25 mg via ORAL
  Filled 2018-07-01 (×4): qty 10

## 2018-07-01 MED ORDER — CHLORHEXIDINE GLUCONATE 4 % EX LIQD: 60.0000 mL | Freq: Once | CUTANEOUS | Status: DC

## 2018-07-01 MED ORDER — CEFAZOLIN SODIUM-DEXTROSE 2-4 GM/100ML-% IV SOLN
2.0000 g | INTRAVENOUS | Status: AC
  Administered 2018-07-01: 2 g via INTRAVENOUS

## 2018-07-01 MED ORDER — MIDAZOLAM HCL 2 MG/2ML IJ SOLN
INTRAMUSCULAR | Status: AC
  Filled 2018-07-01: qty 2

## 2018-07-01 MED ORDER — FENTANYL CITRATE (PF) 100 MCG/2ML IJ SOLN
INTRAMUSCULAR | Status: DC | PRN
  Administered 2018-07-01: 50 ug via INTRAVENOUS
  Administered 2018-07-01: 100 ug via INTRAVENOUS

## 2018-07-01 MED ORDER — METOCLOPRAMIDE HCL 5 MG PO TABS: 5.0000 mg | ORAL_TABLET | Freq: Three times a day (TID) | ORAL | Status: DC | PRN

## 2018-07-01 MED ORDER — DOCUSATE SODIUM 100 MG PO CAPS
100.0000 mg | ORAL_CAPSULE | Freq: Two times a day (BID) | ORAL | Status: DC
  Administered 2018-07-01 – 2018-07-04 (×3): 100 mg via ORAL
  Filled 2018-07-01 (×6): qty 1

## 2018-07-01 MED ORDER — METHOCARBAMOL 500 MG PO TABS
500.0000 mg | ORAL_TABLET | Freq: Four times a day (QID) | ORAL | Status: DC | PRN
  Administered 2018-07-01 – 2018-07-04 (×6): 500 mg via ORAL
  Filled 2018-07-01 (×6): qty 1

## 2018-07-01 MED ORDER — VANCOMYCIN HCL IN DEXTROSE 1-5 GM/200ML-% IV SOLN
1000.0000 mg | Freq: Once | INTRAVENOUS | Status: AC
  Administered 2018-07-01: 1000 mg via INTRAVENOUS

## 2018-07-01 MED ORDER — ONDANSETRON HCL 4 MG/2ML IJ SOLN
INTRAMUSCULAR | Status: DC | PRN
  Administered 2018-07-01: 4 mg via INTRAVENOUS

## 2018-07-01 MED ORDER — LACTATED RINGERS IV SOLN: INTRAVENOUS | Status: DC

## 2018-07-01 MED ORDER — ROCURONIUM BROMIDE 10 MG/ML (PF) SYRINGE
PREFILLED_SYRINGE | INTRAVENOUS | Status: AC
  Filled 2018-07-01: qty 10

## 2018-07-01 MED ORDER — MAGNESIUM CITRATE PO SOLN: 1.0000 | Freq: Once | ORAL | Status: DC | PRN

## 2018-07-01 MED ORDER — POLYETHYLENE GLYCOL 3350 17 G PO PACK: 17.0000 g | PACK | Freq: Every day | ORAL | Status: DC | PRN

## 2018-07-01 MED ORDER — MIDAZOLAM HCL 5 MG/5ML IJ SOLN
INTRAMUSCULAR | Status: DC | PRN
  Administered 2018-07-01: 2 mg via INTRAVENOUS

## 2018-07-01 MED ORDER — PROPOFOL 10 MG/ML IV BOLUS
INTRAVENOUS | Status: DC | PRN
  Administered 2018-07-01: 160 mg via INTRAVENOUS

## 2018-07-01 MED ORDER — LACTATED RINGERS IV SOLN
INTRAVENOUS | Status: DC | PRN
  Administered 2018-07-01: 16:00:00 via INTRAVENOUS

## 2018-07-01 MED ORDER — SORBITOL 70 % SOLN: 30.0000 mL | Freq: Every day | Status: DC | PRN

## 2018-07-01 MED ORDER — EPHEDRINE 5 MG/ML INJ
INTRAVENOUS | Status: AC
  Filled 2018-07-01: qty 10

## 2018-07-01 MED ORDER — EPHEDRINE SULFATE-NACL 50-0.9 MG/10ML-% IV SOSY
PREFILLED_SYRINGE | INTRAVENOUS | Status: DC | PRN
  Administered 2018-07-01: 10 mg via INTRAVENOUS
  Administered 2018-07-01: 5 mg via INTRAVENOUS
  Administered 2018-07-01: 10 mg via INTRAVENOUS

## 2018-07-01 MED ORDER — HYDROCODONE-ACETAMINOPHEN 7.5-325 MG PO TABS
1.0000 | ORAL_TABLET | ORAL | Status: DC | PRN
  Administered 2018-07-01 – 2018-07-02 (×3): 2 via ORAL
  Filled 2018-07-01 (×3): qty 2

## 2018-07-01 MED ORDER — CEFAZOLIN SODIUM-DEXTROSE 2-4 GM/100ML-% IV SOLN
2.0000 g | Freq: Four times a day (QID) | INTRAVENOUS | Status: AC
  Administered 2018-07-01 – 2018-07-02 (×3): 2 g via INTRAVENOUS
  Filled 2018-07-01 (×3): qty 100

## 2018-07-01 MED ORDER — METOCLOPRAMIDE HCL 5 MG/ML IJ SOLN: 5.0000 mg | Freq: Three times a day (TID) | INTRAMUSCULAR | Status: DC | PRN

## 2018-07-01 MED ORDER — ONDANSETRON HCL 4 MG/2ML IJ SOLN
INTRAMUSCULAR | Status: AC
  Filled 2018-07-01: qty 2

## 2018-07-01 MED ORDER — VANCOMYCIN HCL 1000 MG IV SOLR
INTRAVENOUS | Status: DC | PRN
  Administered 2018-07-01: 1000 mg via TOPICAL

## 2018-07-01 MED ORDER — ONDANSETRON HCL 4 MG PO TABS: 4.0000 mg | ORAL_TABLET | Freq: Four times a day (QID) | ORAL | Status: DC | PRN

## 2018-07-01 MED ORDER — ONDANSETRON HCL 4 MG/2ML IJ SOLN: 4.0000 mg | Freq: Four times a day (QID) | INTRAMUSCULAR | Status: DC | PRN

## 2018-07-01 MED ORDER — DEXAMETHASONE SODIUM PHOSPHATE 10 MG/ML IJ SOLN
INTRAMUSCULAR | Status: AC
  Filled 2018-07-01: qty 1

## 2018-07-01 MED ORDER — HYDROCODONE-ACETAMINOPHEN 5-325 MG PO TABS: 1.0000 | ORAL_TABLET | ORAL | Status: DC | PRN

## 2018-07-01 MED ORDER — SODIUM CHLORIDE 0.9 % IV SOLN
INTRAVENOUS | Status: DC
  Administered 2018-07-01: 22:00:00 via INTRAVENOUS

## 2018-07-01 MED ORDER — FENTANYL CITRATE (PF) 250 MCG/5ML IJ SOLN
INTRAMUSCULAR | Status: AC
  Filled 2018-07-01: qty 5

## 2018-07-01 MED ORDER — ENOXAPARIN SODIUM 40 MG/0.4ML ~~LOC~~ SOLN: 40.0000 mg | SUBCUTANEOUS | Status: DC

## 2018-07-01 MED ORDER — SODIUM CHLORIDE 0.9 % IR SOLN
Status: DC | PRN
  Administered 2018-07-01: 3000 mL

## 2018-07-01 MED ORDER — MORPHINE SULFATE (PF) 2 MG/ML IV SOLN
0.5000 mg | INTRAVENOUS | Status: DC | PRN
  Administered 2018-07-01 – 2018-07-02 (×5): 1 mg via INTRAVENOUS
  Filled 2018-07-01 (×5): qty 1

## 2018-07-01 MED ORDER — 0.9 % SODIUM CHLORIDE (POUR BTL) OPTIME
TOPICAL | Status: DC | PRN
  Administered 2018-07-01: 1000 mL

## 2018-07-01 MED ORDER — PROPOFOL 10 MG/ML IV BOLUS
INTRAVENOUS | Status: AC
  Filled 2018-07-01: qty 20

## 2018-07-01 MED ORDER — CEFAZOLIN SODIUM-DEXTROSE 2-4 GM/100ML-% IV SOLN
INTRAVENOUS | Status: AC
  Filled 2018-07-01: qty 100

## 2018-07-01 MED ORDER — VANCOMYCIN HCL 1000 MG IV SOLR
INTRAVENOUS | Status: AC
  Filled 2018-07-01: qty 1000

## 2018-07-01 MED ORDER — LIDOCAINE 2% (20 MG/ML) 5 ML SYRINGE
INTRAMUSCULAR | Status: DC | PRN
  Administered 2018-07-01: 100 mg via INTRAVENOUS

## 2018-07-01 MED ORDER — ACETAMINOPHEN 325 MG PO TABS: 325.0000 mg | ORAL_TABLET | Freq: Four times a day (QID) | ORAL | Status: DC | PRN

## 2018-07-01 MED ORDER — LIDOCAINE 2% (20 MG/ML) 5 ML SYRINGE
INTRAMUSCULAR | Status: AC
  Filled 2018-07-01: qty 5

## 2018-07-01 MED ORDER — METHOCARBAMOL 1000 MG/10ML IJ SOLN
500.0000 mg | Freq: Four times a day (QID) | INTRAVENOUS | Status: DC | PRN
  Filled 2018-07-01: qty 5

## 2018-07-01 MED ORDER — VANCOMYCIN HCL IN DEXTROSE 1-5 GM/200ML-% IV SOLN
INTRAVENOUS | Status: AC
  Filled 2018-07-01: qty 200

## 2018-07-01 MED ORDER — ENOXAPARIN SODIUM 60 MG/0.6ML ~~LOC~~ SOLN
0.5000 mg/kg | SUBCUTANEOUS | Status: DC
  Administered 2018-07-02 – 2018-07-04 (×3): 55 mg via SUBCUTANEOUS
  Filled 2018-07-01 (×3): qty 0.6

## 2018-07-01 MED ORDER — DEXAMETHASONE SODIUM PHOSPHATE 10 MG/ML IJ SOLN
INTRAMUSCULAR | Status: DC | PRN
  Administered 2018-07-01: 4 mg via INTRAVENOUS

## 2018-07-01 MED ORDER — ACETAMINOPHEN 500 MG PO TABS
500.0000 mg | ORAL_TABLET | Freq: Four times a day (QID) | ORAL | Status: AC
  Administered 2018-07-01 – 2018-07-02 (×3): 500 mg via ORAL
  Filled 2018-07-01 (×3): qty 1

## 2018-07-01 SURGICAL SUPPLY — 49 items
BANDAGE ACE 3X5.8 VEL STRL LF (GAUZE/BANDAGES/DRESSINGS) IMPLANT
BNDG COHESIVE 4X5 TAN STRL (GAUZE/BANDAGES/DRESSINGS) ×3 IMPLANT
BNDG COHESIVE 6X5 TAN STRL LF (GAUZE/BANDAGES/DRESSINGS) ×6 IMPLANT
BNDG GAUZE STRTCH 6 (GAUZE/BANDAGES/DRESSINGS) ×3 IMPLANT
COVER SURGICAL LIGHT HANDLE (MISCELLANEOUS) ×3 IMPLANT
COVER WAND RF STERILE (DRAPES) ×3 IMPLANT
CUFF TOURN SGL QUICK 24 (TOURNIQUET CUFF)
CUFF TOURN SGL QUICK 34 (TOURNIQUET CUFF)
CUFF TOURNIQUET SINGLE 44IN (TOURNIQUET CUFF) IMPLANT
CUFF TRNQT CYL 24X4X16.5-23 (TOURNIQUET CUFF) IMPLANT
CUFF TRNQT CYL 34X4.125X (TOURNIQUET CUFF) IMPLANT
DRAPE U-SHAPE 47X51 STRL (DRAPES) ×3 IMPLANT
DURAPREP 26ML APPLICATOR (WOUND CARE) ×3 IMPLANT
ELECT REM PT RETURN 9FT ADLT (ELECTROSURGICAL)
ELECTRODE REM PT RTRN 9FT ADLT (ELECTROSURGICAL) IMPLANT
GAUZE SPONGE 4X4 12PLY STRL (GAUZE/BANDAGES/DRESSINGS) ×6 IMPLANT
GAUZE XEROFORM 5X9 LF (GAUZE/BANDAGES/DRESSINGS) ×3 IMPLANT
GLOVE BIOGEL PI IND STRL 7.0 (GLOVE) ×1 IMPLANT
GLOVE BIOGEL PI INDICATOR 7.0 (GLOVE) ×2
GLOVE ECLIPSE 7.0 STRL STRAW (GLOVE) ×3 IMPLANT
GLOVE SKINSENSE NS SZ7.5 (GLOVE) ×4
GLOVE SKINSENSE STRL SZ7.5 (GLOVE) ×2 IMPLANT
GOWN STRL REIN XL XLG (GOWN DISPOSABLE) ×6 IMPLANT
HANDPIECE INTERPULSE COAX TIP (DISPOSABLE)
KIT BASIN OR (CUSTOM PROCEDURE TRAY) ×3 IMPLANT
KIT TURNOVER KIT B (KITS) ×3 IMPLANT
MANIFOLD NEPTUNE II (INSTRUMENTS) ×3 IMPLANT
PACK ORTHO EXTREMITY (CUSTOM PROCEDURE TRAY) ×3 IMPLANT
PAD ABD 8X10 STRL (GAUZE/BANDAGES/DRESSINGS) ×3 IMPLANT
PAD ARMBOARD 7.5X6 YLW CONV (MISCELLANEOUS) ×6 IMPLANT
PADDING CAST ABS 4INX4YD NS (CAST SUPPLIES) ×2
PADDING CAST ABS COTTON 4X4 ST (CAST SUPPLIES) ×1 IMPLANT
PADDING CAST COTTON 6X4 STRL (CAST SUPPLIES) ×3 IMPLANT
SET CYSTO W/LG BORE CLAMP LF (SET/KITS/TRAYS/PACK) ×2 IMPLANT
SET HNDPC FAN SPRY TIP SCT (DISPOSABLE) IMPLANT
SPONGE LAP 18X18 RF (DISPOSABLE) ×3 IMPLANT
STOCKINETTE IMPERVIOUS 9X36 MD (GAUZE/BANDAGES/DRESSINGS) ×3 IMPLANT
SUT ETHILON 2 0 FS 18 (SUTURE) ×3 IMPLANT
SUT ETHILON 2 0 PSLX (SUTURE) IMPLANT
SUT VIC AB 2-0 FS1 27 (SUTURE) ×6 IMPLANT
SWAB CULTURE ESWAB REG 1ML (MISCELLANEOUS) IMPLANT
TOWEL GREEN STERILE FF (TOWEL DISPOSABLE) ×3 IMPLANT
TOWEL OR 17X26 10 PK STRL BLUE (TOWEL DISPOSABLE) ×3 IMPLANT
TUBE CONNECTING 12'X1/4 (SUCTIONS) ×1
TUBE CONNECTING 12X1/4 (SUCTIONS) ×2 IMPLANT
TUBING TUR DISP (UROLOGICAL SUPPLIES) ×3 IMPLANT
UNDERPAD 30X30 (UNDERPADS AND DIAPERS) ×6 IMPLANT
WATER STERILE IRR 1000ML POUR (IV SOLUTION) ×3 IMPLANT
YANKAUER SUCT BULB TIP NO VENT (SUCTIONS) ×3 IMPLANT

## 2018-07-01 NOTE — Anesthesia Postprocedure Evaluation (Signed)
Anesthesia Post Note  Patient: [name]  Procedure(s) Performed: RIGHT ANKLE EX FIX REMOVAL, IRRIGATION AND DEBRIDEMENT AND SHORT LEG CAST (Right ) REMOVAL EXTERNAL FIXATION LEG (Right )     Patient location during evaluation: PACU Anesthesia Type: General Level of consciousness: awake and alert Pain management: pain level controlled Vital Signs Assessment: post-procedure vital signs reviewed and stable Respiratory status: spontaneous breathing, nonlabored ventilation, respiratory function stable and patient connected to nasal cannula oxygen Cardiovascular status: blood pressure returned to baseline and stable Postop Assessment: no apparent nausea or vomiting Anesthetic complications: no    Last Vitals:  Vitals:   07/01/18 1740 07/01/18 1815  BP: (!) 156/74 (!) 174/87  Pulse: 71 71  Resp: 18 18  Temp: 36.7 C 36.6 C  SpO2: 94% 97%    Last Pain:  Vitals:   07/01/18 1815  TempSrc: Oral  PainSc:                  [name] DAVID

## 2018-07-01 NOTE — Anesthesia Preprocedure Evaluation (Signed)
Anesthesia Evaluation  Patient identified by MRN, date of birth, ID band Patient awake    Reviewed: Allergy & Precautions, NPO status , Patient's Chart, lab work & pertinent test results  Airway Mallampati: I  TM Distance: >3 FB Neck ROM: Full    Dental   Pulmonary COPD, former smoker,    Pulmonary exam normal        Cardiovascular hypertension, Pt. on medications Normal cardiovascular exam     Neuro/Psych Anxiety Depression    GI/Hepatic GERD  Medicated and Controlled,  Endo/Other  diabetes  Renal/GU Renal InsufficiencyRenal disease     Musculoskeletal   Abdominal   Peds  Hematology   Anesthesia Other Findings   Reproductive/Obstetrics                             Anesthesia Physical Anesthesia Plan  ASA: III  Anesthesia Plan: General   Post-op Pain Management:    Induction: Intravenous  PONV Risk Score and Plan: 3 and Treatment may vary due to age or medical condition, Ondansetron and Midazolam  Airway Management Planned: LMA  Additional Equipment:   Intra-op Plan:   Post-operative Plan: Extubation in OR  Informed Consent: I have reviewed the patients History and Physical, chart, labs and discussed the procedure including the risks, benefits and alternatives for the proposed anesthesia with the patient or authorized representative who has indicated his/her understanding and acceptance.       Plan Discussed with: CRNA and Surgeon  Anesthesia Plan Comments:         Anesthesia Quick Evaluation

## 2018-07-01 NOTE — [place] Notes (Signed)
Call placed to %n to speak with receiving nurse. Nurse unable to answer the phone. Message left with Gwen that order for Cephied (rapid) nasopharyngeal swab has been placed and released for this patient and needs to be collected and taken to lab by RN as the patient is coming to surgery this afternoon.

## 2018-07-01 NOTE — H&P (Signed)

## 2018-07-01 NOTE — Transfer of Care (Signed)
Immediate Anesthesia Transfer of Care Note  Patient: [name]  Procedure(s) Performed: RIGHT ANKLE EX FIX REMOVAL, IRRIGATION AND DEBRIDEMENT AND SHORT LEG CAST (Right ) REMOVAL EXTERNAL FIXATION LEG (Right )  Patient Location: PACU  Anesthesia Type:General  Level of Consciousness: awake  Airway & Oxygen Therapy: Patient Spontanous Breathing and Patient connected to nasal cannula oxygen  Post-op Assessment: Report given to RN and Post -op Vital signs reviewed and stable  Post vital signs: Reviewed and stable  Last Vitals:  Vitals Value Taken Time  BP 160/75 07/01/18 1724  Temp    Pulse 75 07/01/18 1729  Resp 17 07/01/18 1729  SpO2 95 % 07/01/18 1729  Vitals shown include unvalidated device data.  Last Pain:  Vitals:   07/01/18 1535  TempSrc:   PainSc: 10-Worst pain ever      Patients Stated Pain Goal: 3 (88/30/14 1597)  Complications: No apparent anesthesia complications

## 2018-07-01 NOTE — Op Note (Addendum)
   Date of Surgery: 07/01/2018  INDICATIONS: Ms. Haslip is a 53 y.o.-year-old female with ankle fracture;  The patient did consent to the procedure after discussion of the risks and benefits.  PREOPERATIVE DIAGNOSIS: Loosening of external fixator and subacute right bimalleolar ankle fracture with impending soft tissue compromise  POSTOPERATIVE DIAGNOSIS: Same.  PROCEDURE:  1. Removal of right ankle external fixator under general anesthesia 2. Irrigation and debridement of right lower extremity including bone, skin, subcutaneous tissue 3. Application of right short leg cast  SURGEON: N. Eduard Roux, M.D.  ASSIST: Ciro Backer [place], [place]; necessary for the timely completion of procedure and due to complexity of procedure.  ANESTHESIA:  general  IV FLUIDS AND URINE: See anesthesia.  ESTIMATED BLOOD LOSS: minimal mL.  IMPLANTS: none  DRAINS: none  COMPLICATIONS: see description of procedure.  DESCRIPTION OF PROCEDURE: The patient was brought to the operating room and placed supine on the operating table.  The patient had been signed prior to the procedure and this was documented. The patient had the anesthesia placed by the anesthesiologist.  A time-out was performed to confirm that this was the correct patient, site, side and location. The patient did receive antibiotics prior to the incision and was re-dosed during the procedure as needed at indicated intervals.  The patient had the operative extremity prepped and draped in the standard surgical fashion.    The external fixator was removed under anesthesia without difficulty with provided wrenches.  I then performed excisional debridement of the pin sites with a curette and rongeur.  This included the tibia and calcaneus as well skin and subcutaneous tissue.  These holes were then thoroughly irrigated.  Vancomycin powder was placed in the holes and covered with xeroform.  Sterile dressings were applied.  Fluoro was used to confirm  that the fracture did not shift.  Examination showed no movement of the fractures.  A short leg cast was then applied with the ankle at 90 degrees.  Patient tolerated the procedure well and had no immediate complications.  POSTOPERATIVE PLAN: Non weight bearing right lower leg.  Up with PT.  Azucena Cecil, MD [place] 5:17 PM

## 2018-07-01 NOTE — Plan of Care (Signed)
  Problem: Education: Goal: Knowledge of General Education information will improve Description Including pain rating scale, medication(s)/side effects and non-pharmacologic comfort measures Outcome: Progressing   

## 2018-07-01 NOTE — Anesthesia Procedure Notes (Signed)
Procedure Name: LMA Insertion °Performed by: Chandy Tarman H, CRNA °Pre-anesthesia Checklist: Patient identified, Emergency Drugs available, Suction available and Patient being monitored °Patient Re-evaluated:Patient Re-evaluated prior to induction °Oxygen Delivery Method: Circle System Utilized °Preoxygenation: Pre-oxygenation with 100% oxygen °Induction Type: IV induction °Ventilation: Mask ventilation without difficulty °LMA: LMA inserted °LMA Size: 4.0 °Number of attempts: 1 °Airway Equipment and Method: Bite block °Placement Confirmation: positive ETCO2 °Tube secured with: Tape °Dental Injury: Teeth and Oropharynx as per pre-operative assessment  ° ° ° ° ° ° °

## 2018-07-01 NOTE — Progress Notes (Signed)
Post-Op Visit Note   Patient: [name]           Date of Birth: July 03, 1965           MRN: [id number] Visit Date: 07/01/2018 PCP: [name], PA-C   Assessment & Plan:  Chief Complaint: postop visit  Visit Diagnoses:  1. Closed fracture of right ankle, sequela     Plan: Zoey is approximately 1 month status post external fixator placement for an unstable right bimalleolar ankle fracture dislocation that was complicated by massive swelling and soft tissue problems that precluded [place] from safely performing ORIF.  The decision was made to leave her in the external fixator for definitive treatment.  She comes in today for follow-up.  She denies any pain secondary to peripheral neuropathy.  Denies any constitutional symptoms.  On exam her transfixing calcaneal pin has loosened within the calcaneus and the lateral clamp is now directly putting pressure on the lateral foot.  Fortunately there is no skin compromise yet.  There is no neurovascular compromise.  Swelling has overall improved.  There is some murky discharge from the pin sites.  At this point we will need to take her to the operating room to remove the external fixator and to prevent pressure ulcer from the clamp.  The plan is to perform the procedure this afternoon to avoid any problems with the skin.  We will place her in a short leg cast afterwards to monitor her fracture.  If her fracture is not stable within the cast we may need to consider a Lovena Le spatial frame.  All this was discussed with the patient who is in agreement.  She will be n.p.o. until surgery this afternoon.  I have contacted Dr. Karmen Bongo of internal medicine who has agreed to accept her onto her service.   Follow-Up Instructions: Return if symptoms worsen or fail to improve.   Orders:  Orders Placed This Encounter  Procedures  . XR Ankle Complete Right   No orders of the defined types were placed in this encounter.   Imaging: Xr  Ankle Complete Right  Result Date: 07/01/2018 Loosening of the transfixing calcaneal pin.  The bimalleolar ankle fracture alignment appears to be stable.   PMFS History: Patient Active Problem List   Diagnosis Date Noted  . Displaced bimalleolar fracture of right ankle, closed, initial encounter 06/02/2018  . Avascular necrosis of bone of left hip ([place]) 04/03/2018  . Mucopurulent chronic bronchitis ([place]) 03/11/2018  . Allergic rhinitis 03/11/2018  . Anxiety and depression 12/29/2017  . Acute respiratory failure with hypoxia ([place]) 12/28/2017  . Syncope 08/29/2017  . DKA (diabetic ketoacidoses) ([place]) 03/02/2017  . COPD exacerbation ([place]) 02/23/2017  . Acute on chronic respiratory failure with hypoxia and hypercapnia ([place]) 08/19/2016  . Leukocytosis 08/19/2016  . CKD (chronic kidney disease), stage III ([place]) 08/19/2016  . Altered mental status 08/19/2016  . Asthma exacerbation 06/18/2016  . Insomnia 06/12/2016  . Difficult intravenous access   . Acute on chronic respiratory failure with hypoxia ([place]) 05/19/2016  . NSTEMI (non-ST elevated myocardial infarction) ([place]) 05/19/2016  . Acute metabolic encephalopathy 53/64/6803  . Hyperlipidemia   . Class 3 obesity due to excess calories with serious comorbidity and body mass index (BMI) of 40.0 to 44.9 in adult   . Obesity hypoventilation syndrome ([place])   . Diastolic dysfunction 21/22/4825  . Diabetic neuropathy ([place]) 11/10/2014  . Fracture of distal femur ([place]) 07/08/2014  . Osteoporosis 07/08/2014  . Femur fracture, left ([place]) 07/08/2014  .  Colles' fracture of left radius   . Left hip pain 05/03/2014  . Perimenopausal symptoms 02/13/2012  . Hyponatremia 12/29/2011  . Asthma exacerbation attacks 12/28/2011  . Bronchitis 12/28/2011  . Repeated falls 07/30/2011  . Hip fracture, left ([place]) 07/16/2011  . Femoral neck fracture ([place]) 07/16/2011  . Tobacco abuse 04/14/2010  . Hyperlipidemia associated with type 2 diabetes mellitus ([place])  12/23/2008  . COPD (chronic obstructive pulmonary disease) ([place]) 11/29/2008  . THRUSH 05/20/2008  . Human immunodeficiency virus (HIV) disease ([place]) 04/19/2008  . Diabetes mellitus type 2 with complications, uncontrolled ([place]) 04/19/2008  . Mood disorder ([place]) 04/19/2008  . Hereditary and idiopathic peripheral neuropathy 04/19/2008  . Hypertension associated with diabetes ([place]) 04/19/2008   Past Medical History:  Diagnosis Date  . Acute metabolic encephalopathy 8/84/1660  . Acute on chronic respiratory failure with hypoxia ([place]) 05/19/2016  . Anxiety   . ARF (acute renal failure) ([place]) 05/03/2014  . Arthritis    LEFT HIP  . Asthma    HOSPITALIZED WITH EXCERBATION OF ASTHMA - AND BRONCHITIS AND THE FLU DEC 2013  . Asthma exacerbation attacks 12/28/2011  . Bronchitis 12/28/2011  . CAP (community acquired pneumonia) 07/25/2012  . Chronic kidney disease (CKD), stage IV (severe) ([place]) 11/10/2014  . Class 3 obesity due to excess calories with serious comorbidity and body mass index (BMI) of 40.0 to 44.9 in adult   . Colles' fracture of left radius   . COPD (chronic obstructive pulmonary disease) ([place])   . Depression   . Diabetes mellitus    ON INSULIN AND ORAL MEDICATIONS  . Diabetes mellitus type 2 with complications, uncontrolled ([place]) 04/19/2008   Qualifier: Diagnosis of  By: Tomma Lightning MD, Claiborne Billings     . Diabetic foot infection ([place]) 11/09/2014  . Diabetic ketoacidosis without coma associated with type 2 diabetes mellitus ([place])   . Diabetic neuropathy ([place]) 11/10/2014  . Diarrhea 08/30/2008   Qualifier: Diagnosis of  By: Tomma Lightning MD, Claiborne Billings    . Diastolic dysfunction 63/0/1601  . Difficult intravenous access   . DKA, type 2 ([place]) 04/04/2016  . Dyslipidemia 12/23/2008   Qualifier: Diagnosis of  By: Tomma Lightning MD, Claiborne Billings    . Essential hypertension 04/19/2008   Qualifier: Diagnosis of  By: Tomma Lightning MD, Claiborne Billings    . FATIGUE 07/12/2008   Qualifier: Diagnosis of  By: Tomma Lightning MD, Claiborne Billings    . Femoral neck  fracture ([place]) 07/16/2011  . Femur fracture, left ([place]) 07/08/2014  . Fever   . Fracture of distal femur ([place]) 07/08/2014  . GERD (gastroesophageal reflux disease)   . Hereditary and idiopathic peripheral neuropathy 04/19/2008   Qualifier: Diagnosis of  By: Tomma Lightning MD, Claiborne Billings    . Hip fracture, left ([place]) 07/16/2011  . Hip pain   . HIV infection ([place]) 2000  . Human immunodeficiency virus (HIV) disease ([place]) 04/19/2008   [id number] +   . Hyperlipidemia   . Hypertension   . Hyponatremia 12/29/2011  . Influenza A 12/29/2011  . Influenza B   . Insomnia 06/12/2016  . Left hip pain 05/03/2014  . Mood disorder ([place]) 04/19/2008   Qualifier: Diagnosis of  By: Tomma Lightning MD, Claiborne Billings    . Neuropathy    NEUROPATHY HANDS AND FEET  . NSTEMI (non-ST elevated myocardial infarction) ([place]) 05/19/2016  . Obesity hypoventilation syndrome ([place])   . Osteoporosis 07/08/2014  . Pain    SEVERE PAIN LEFT HIP - HX OF LEFT HIP PINNING JULY 2013  . Perimenopausal symptoms 02/13/2012   LMP around 2011. On  estrace and provera since around 2012 for hot flashes, mood swings. Estrace 2 mg daily, Provera 2.5 mg for 5 days each month.   . Repeated falls 07/30/2011  . Sepsis ([place]) 04/04/2016  . Shortness of breath    ALLERGIES ARE "ACTING UP"  . SOB (shortness of breath)   . THRUSH 05/20/2008   Qualifier: Diagnosis of  By: Tomma Lightning MD, Claiborne Billings      . Tobacco use disorder 04/14/2010    Family History  Problem Relation Age of Onset  . Diabetes Mother   . Hypertension Mother   . Vision loss Mother   . Heart disease Mother   . Hypertension Father   . Thyroid disease Neg Hx     Past Surgical History:  Procedure Laterality Date  . AMPUTATION Left 11/18/2014   Procedure: LEFT BELOW KNEE AMPUTATION;  Surgeon: Leandrew Koyanagi, MD;  Location: [facility];  Service: Orthopedics;  Laterality: Left;  . CAST APPLICATION Left 01/08/8414   Procedure: CAST APPLICATION (FIBERGLASS);  Surgeon: Latanya Maudlin, MD;  [facility];  Service: Orthopedics;   Laterality: Left;  CLOSED REDUCTION OF LEFT COLLES FRACTURE WITH SHORT ARM CAST  . EXTERNAL FIXATION LEG Right 06/02/2018   Procedure: EXTERNAL FIXATION LEG;  Surgeon: Leandrew Koyanagi, MD;  Location: [facility];  Service: Orthopedics;  Laterality: Right;  . HARDWARE REMOVAL Left 05/05/2014   Procedure: HARDWARE REMOVAL LEFT HIP;  Surgeon: Latanya Maudlin, MD;  [facility];  Service: Orthopedics;  Laterality: Left;  REMOVAL BIOMET 6.5-8.0 CANNULATED SCREW  . HIP ARTHROPLASTY Left 05/05/2014   Procedure: ARTHROPLASTY OPEN REDUCTION INTERNAL FIXATION LEFT HIP AND REMOVAL OF TWO CANNULATED SCREW;  Surgeon: Latanya Maudlin, MD;  [facility];  Service: Orthopedics;  Laterality: Left;  . HIP PINNING,CANNULATED  07/16/2011   Procedure: CANNULATED HIP PINNING;  Surgeon: Gearlean Alf, MD;  [facility];  Service: Orthopedics;  Laterality: Left;  . HIP PINNING,CANNULATED Left 04/09/2012   Procedure: CANNULATED HIP PINNING AND HARDWARE REVISION;  Surgeon: Gearlean Alf, MD;  [facility];  Service: Orthopedics;  Laterality: Left;  . I&D EXTREMITY Left 11/16/2014   Procedure:  DEBRIDEMENT OF LEFT FOOT POSSIBLE APPLICATION OF INTEGRIA AND VAC ;  Surgeon: Irene Limbo, MD;  Location: [facility];  Service: Plastics;  Laterality: Left;  . PERIPHERAL VASCULAR CATHETERIZATION N/A 11/15/2014   Procedure: Abdominal Aortogram;  Surgeon: Conrad Basalt, MD;  Location: [facility];  Service: Cardiovascular;  Laterality: N/A;  . PERIPHERAL VASCULAR CATHETERIZATION  11/15/2014   Procedure: Lower Extremity Angiography;  Surgeon: Conrad [place], MD;  Location: [facility];  Service: Cardiovascular;;  . PERIPHERAL VASCULAR CATHETERIZATION Left 11/15/2014   Procedure: Peripheral Vascular Intervention;  Surgeon: Conrad [place], MD;  Location: [facility];  Service: Cardiovascular;  Laterality: Left;  popliteal artery stenting   Social History   Occupational History  . Not on file  Tobacco Use  . Smoking  status: Current Every Day Smoker    Packs/day: 1.50    Years: 38.00    Pack years: 57.00    Types: E-cigarettes, Cigarettes    Start date: 03/10/1981  . Smokeless tobacco: Never Used  . Tobacco comment: 3/4 of a pack per day 03/11/18  Substance and Sexual Activity  . Alcohol use: No    Alcohol/week: 0.0 standard drinks    Comment: no drink since 2008  . Drug use: No    Comment: hx of crack/cocaine, clean 8 years  . Sexual  activity: Not Currently    Partners: Male

## 2018-07-01 NOTE — Progress Notes (Signed)
Patient complaining of pain 10/10 in R ankle, requesting pain medication. Called Dr. Conrad Marble, per MD will come evaluate patient.

## 2018-07-02 ENCOUNTER — Encounter (HOSPITAL_COMMUNITY): Payer: Self-pay | Admitting: Orthopaedic Surgery

## 2018-07-02 DIAGNOSIS — J449 Chronic obstructive pulmonary disease, unspecified: Secondary | ICD-10-CM | POA: Diagnosis present

## 2018-07-02 DIAGNOSIS — Z1159 Encounter for screening for other viral diseases: Secondary | ICD-10-CM | POA: Diagnosis not present

## 2018-07-02 DIAGNOSIS — S82841A Displaced bimalleolar fracture of right lower leg, initial encounter for closed fracture: Secondary | ICD-10-CM | POA: Diagnosis not present

## 2018-07-02 DIAGNOSIS — Z21 Asymptomatic human immunodeficiency virus [HIV] infection status: Secondary | ICD-10-CM | POA: Diagnosis present

## 2018-07-02 DIAGNOSIS — N189 Chronic kidney disease, unspecified: Secondary | ICD-10-CM

## 2018-07-02 DIAGNOSIS — W19XXXS Unspecified fall, sequela: Secondary | ICD-10-CM | POA: Diagnosis present

## 2018-07-02 DIAGNOSIS — Z7951 Long term (current) use of inhaled steroids: Secondary | ICD-10-CM | POA: Diagnosis not present

## 2018-07-02 DIAGNOSIS — N179 Acute kidney failure, unspecified: Secondary | ICD-10-CM

## 2018-07-02 DIAGNOSIS — K219 Gastro-esophageal reflux disease without esophagitis: Secondary | ICD-10-CM | POA: Diagnosis present

## 2018-07-02 DIAGNOSIS — J9611 Chronic respiratory failure with hypoxia: Secondary | ICD-10-CM | POA: Diagnosis present

## 2018-07-02 DIAGNOSIS — E785 Hyperlipidemia, unspecified: Secondary | ICD-10-CM | POA: Diagnosis present

## 2018-07-02 DIAGNOSIS — Z9981 Dependence on supplemental oxygen: Secondary | ICD-10-CM | POA: Diagnosis not present

## 2018-07-02 DIAGNOSIS — Z794 Long term (current) use of insulin: Secondary | ICD-10-CM | POA: Diagnosis not present

## 2018-07-02 DIAGNOSIS — Z833 Family history of diabetes mellitus: Secondary | ICD-10-CM | POA: Diagnosis not present

## 2018-07-02 DIAGNOSIS — I252 Old myocardial infarction: Secondary | ICD-10-CM | POA: Diagnosis not present

## 2018-07-02 DIAGNOSIS — Z821 Family history of blindness and visual loss: Secondary | ICD-10-CM | POA: Diagnosis not present

## 2018-07-02 DIAGNOSIS — E1165 Type 2 diabetes mellitus with hyperglycemia: Secondary | ICD-10-CM

## 2018-07-02 DIAGNOSIS — E1122 Type 2 diabetes mellitus with diabetic chronic kidney disease: Secondary | ICD-10-CM | POA: Diagnosis present

## 2018-07-02 DIAGNOSIS — M810 Age-related osteoporosis without current pathological fracture: Secondary | ICD-10-CM | POA: Diagnosis present

## 2018-07-02 DIAGNOSIS — E1140 Type 2 diabetes mellitus with diabetic neuropathy, unspecified: Secondary | ICD-10-CM | POA: Diagnosis present

## 2018-07-02 DIAGNOSIS — I1310 Hypertensive heart and chronic kidney disease without heart failure, with stage 1 through stage 4 chronic kidney disease, or unspecified chronic kidney disease: Secondary | ICD-10-CM | POA: Diagnosis present

## 2018-07-02 DIAGNOSIS — B20 Human immunodeficiency virus [HIV] disease: Secondary | ICD-10-CM

## 2018-07-02 DIAGNOSIS — N182 Chronic kidney disease, stage 2 (mild): Secondary | ICD-10-CM | POA: Diagnosis present

## 2018-07-02 DIAGNOSIS — S82891A Other fracture of right lower leg, initial encounter for closed fracture: Secondary | ICD-10-CM | POA: Diagnosis present

## 2018-07-02 DIAGNOSIS — M25571 Pain in right ankle and joints of right foot: Secondary | ICD-10-CM | POA: Diagnosis present

## 2018-07-02 DIAGNOSIS — D638 Anemia in other chronic diseases classified elsewhere: Secondary | ICD-10-CM | POA: Diagnosis present

## 2018-07-02 DIAGNOSIS — Z89512 Acquired absence of left leg below knee: Secondary | ICD-10-CM | POA: Diagnosis not present

## 2018-07-02 DIAGNOSIS — D62 Acute posthemorrhagic anemia: Secondary | ICD-10-CM | POA: Diagnosis not present

## 2018-07-02 DIAGNOSIS — Z8249 Family history of ischemic heart disease and other diseases of the circulatory system: Secondary | ICD-10-CM | POA: Diagnosis not present

## 2018-07-02 DIAGNOSIS — T84328A Displacement of other bone devices, implants and grafts, initial encounter: Secondary | ICD-10-CM | POA: Diagnosis present

## 2018-07-02 DIAGNOSIS — E118 Type 2 diabetes mellitus with unspecified complications: Secondary | ICD-10-CM

## 2018-07-02 DIAGNOSIS — Z87891 Personal history of nicotine dependence: Secondary | ICD-10-CM | POA: Diagnosis not present

## 2018-07-02 LAB — CBC
HCT: 34.2 % — ABNORMAL LOW (ref 36.0–46.0)
Hemoglobin: 10.5 g/dL — ABNORMAL LOW (ref 12.0–15.0)
MCH: 26.5 pg (ref 26.0–34.0)
MCHC: 30.7 g/dL (ref 30.0–36.0)
MCV: 86.4 fL (ref 80.0–100.0)
Platelets: 377 10*3/uL (ref 150–400)
RBC: 3.96 MIL/uL (ref 3.87–5.11)
RDW: 18 % — ABNORMAL HIGH (ref 11.5–15.5)
WBC: 11.4 10*3/uL — ABNORMAL HIGH (ref 4.0–10.5)
nRBC: 0 % (ref 0.0–0.2)

## 2018-07-02 LAB — BASIC METABOLIC PANEL
Anion gap: 15 (ref 5–15)
BUN: 26 mg/dL — ABNORMAL HIGH (ref 6–20)
CO2: 21 mmol/L — ABNORMAL LOW (ref 22–32)
Calcium: 8.6 mg/dL — ABNORMAL LOW (ref 8.9–10.3)
Chloride: 94 mmol/L — ABNORMAL LOW (ref 98–111)
Creatinine, Ser: 1.43 mg/dL — ABNORMAL HIGH (ref 0.44–1.00)
GFR calc Af Amer: 48 mL/min — ABNORMAL LOW (ref >60)
GFR calc non Af Amer: 41 mL/min — ABNORMAL LOW (ref >60)
Glucose, Bld: 397 mg/dL — ABNORMAL HIGH (ref 70–99)
Potassium: 5 mmol/L (ref 3.5–5.1)
Sodium: 130 mmol/L — ABNORMAL LOW (ref 135–145)

## 2018-07-02 LAB — GLUCOSE, CAPILLARY
Glucose-Capillary: 346 mg/dL — ABNORMAL HIGH (ref 70–99)
Glucose-Capillary: 383 mg/dL — ABNORMAL HIGH (ref 70–99)
Glucose-Capillary: 272 mg/dL — ABNORMAL HIGH (ref 70–99)
Glucose-Capillary: 250 mg/dL — ABNORMAL HIGH (ref 70–99)

## 2018-07-02 LAB — HEMOGLOBIN A1C
Hemoglobin A1C: 8.1
Hgb A1c MFr Bld: 8.3 % — ABNORMAL HIGH (ref 4.8–5.6)
Mean Plasma Glucose: 191.51 mg/dL

## 2018-07-02 MED ORDER — INSULIN GLARGINE (2 UNIT DIAL) 300 UNIT/ML ~~LOC~~ SOPN: 25.0000 [IU] | PEN_INJECTOR | Freq: Two times a day (BID) | SUBCUTANEOUS | Status: DC

## 2018-07-02 MED ORDER — HYDROCODONE-ACETAMINOPHEN 10-325 MG PO TABS
1.0000 | ORAL_TABLET | ORAL | Status: DC | PRN
  Administered 2018-07-02 – 2018-07-04 (×11): 1 via ORAL
  Filled 2018-07-02 (×11): qty 1

## 2018-07-02 MED ORDER — PREGABALIN 25 MG PO CAPS
225.0000 mg | ORAL_CAPSULE | Freq: Two times a day (BID) | ORAL | Status: DC
  Administered 2018-07-02 – 2018-07-04 (×5): 225 mg via ORAL
  Filled 2018-07-02 (×5): qty 1

## 2018-07-02 MED ORDER — MORPHINE SULFATE (PF) 2 MG/ML IV SOLN
2.0000 mg | INTRAVENOUS | Status: DC | PRN
  Administered 2018-07-02 – 2018-07-04 (×9): 2 mg via INTRAVENOUS
  Filled 2018-07-02 (×9): qty 1

## 2018-07-02 MED ORDER — SERTRALINE HCL 50 MG PO TABS: 50.0000 mg | ORAL_TABLET | Freq: Every day | ORAL | Status: DC

## 2018-07-02 MED ORDER — PANTOPRAZOLE SODIUM 40 MG PO TBEC
40.0000 mg | DELAYED_RELEASE_TABLET | Freq: Every day | ORAL | Status: DC
  Administered 2018-07-02 – 2018-07-04 (×3): 40 mg via ORAL
  Filled 2018-07-02 (×3): qty 1

## 2018-07-02 MED ORDER — INSULIN ASPART 100 UNIT/ML ~~LOC~~ SOLN
0.0000 [IU] | Freq: Three times a day (TID) | SUBCUTANEOUS | Status: DC
  Administered 2018-07-02: 20 [IU] via SUBCUTANEOUS
  Administered 2018-07-02: 11 [IU] via SUBCUTANEOUS
  Administered 2018-07-03: 4 [IU] via SUBCUTANEOUS
  Administered 2018-07-03: 12:00:00 7 [IU] via SUBCUTANEOUS
  Administered 2018-07-03: 15 [IU] via SUBCUTANEOUS
  Administered 2018-07-04: 11 [IU] via SUBCUTANEOUS
  Administered 2018-07-04: 4 [IU] via SUBCUTANEOUS

## 2018-07-02 MED ORDER — FLUTICASONE-UMECLIDIN-VILANT 100-62.5-25 MCG/INH IN AEPB: 1.0000 | INHALATION_SPRAY | Freq: Every day | RESPIRATORY_TRACT | Status: DC

## 2018-07-02 MED ORDER — MAGNESIUM OXIDE 400 (241.3 MG) MG PO TABS
400.0000 mg | ORAL_TABLET | Freq: Every day | ORAL | Status: DC
  Administered 2018-07-02 – 2018-07-03 (×2): 400 mg via ORAL
  Filled 2018-07-02 (×3): qty 1

## 2018-07-02 MED ORDER — BICTEGRAVIR-EMTRICITAB-TENOFOV 50-200-25 MG PO TABS
1.0000 | ORAL_TABLET | Freq: Every day | ORAL | Status: DC
  Administered 2018-07-02 – 2018-07-04 (×3): 1 via ORAL
  Filled 2018-07-02 (×3): qty 1

## 2018-07-02 MED ORDER — MONTELUKAST SODIUM 10 MG PO TABS
10.0000 mg | ORAL_TABLET | Freq: Every day | ORAL | Status: DC
  Administered 2018-07-02 – 2018-07-03 (×2): 10 mg via ORAL
  Filled 2018-07-02 (×2): qty 1

## 2018-07-02 MED ORDER — INSULIN ASPART 100 UNIT/ML ~~LOC~~ SOLN
8.0000 [IU] | Freq: Three times a day (TID) | SUBCUTANEOUS | Status: DC
  Administered 2018-07-02 – 2018-07-04 (×6): 8 [IU] via SUBCUTANEOUS

## 2018-07-02 MED ORDER — INSULIN ASPART 100 UNIT/ML ~~LOC~~ SOLN
0.0000 [IU] | Freq: Every day | SUBCUTANEOUS | Status: DC
  Administered 2018-07-02: 3 [IU] via SUBCUTANEOUS

## 2018-07-02 MED ORDER — SERTRALINE HCL 100 MG PO TABS
150.0000 mg | ORAL_TABLET | Freq: Every day | ORAL | Status: DC
  Administered 2018-07-02 – 2018-07-04 (×3): 150 mg via ORAL
  Filled 2018-07-02 (×3): qty 2

## 2018-07-02 MED ORDER — FLUTICASONE FUROATE-VILANTEROL 100-25 MCG/INH IN AEPB
1.0000 | INHALATION_SPRAY | Freq: Every day | RESPIRATORY_TRACT | Status: DC
  Administered 2018-07-02 – 2018-07-04 (×3): 1 via RESPIRATORY_TRACT
  Filled 2018-07-02: qty 28

## 2018-07-02 MED ORDER — SODIUM CHLORIDE 0.9 % IV SOLN
INTRAVENOUS | Status: DC
  Administered 2018-07-02 – 2018-07-03 (×4): via INTRAVENOUS

## 2018-07-02 MED ORDER — ROSUVASTATIN CALCIUM 20 MG PO TABS
20.0000 mg | ORAL_TABLET | Freq: Every day | ORAL | Status: DC
  Administered 2018-07-02 – 2018-07-04 (×3): 20 mg via ORAL
  Filled 2018-07-02 (×3): qty 1

## 2018-07-02 MED ORDER — INSULIN GLARGINE 100 UNIT/ML ~~LOC~~ SOLN
25.0000 [IU] | Freq: Two times a day (BID) | SUBCUTANEOUS | Status: DC
  Administered 2018-07-02 – 2018-07-04 (×5): 25 [IU] via SUBCUTANEOUS
  Filled 2018-07-02 (×6): qty 0.25

## 2018-07-02 MED ORDER — POLYVINYL ALCOHOL 1.4 % OP SOLN
1.0000 [drp] | OPHTHALMIC | Status: DC | PRN
  Administered 2018-07-02: 1 [drp] via OPHTHALMIC
  Filled 2018-07-02: qty 15

## 2018-07-02 MED ORDER — IPRATROPIUM-ALBUTEROL 0.5-2.5 (3) MG/3ML IN SOLN: 3.0000 mL | Freq: Four times a day (QID) | RESPIRATORY_TRACT | Status: DC | PRN

## 2018-07-02 MED ORDER — UMECLIDINIUM BROMIDE 62.5 MCG/INH IN AEPB
1.0000 | INHALATION_SPRAY | Freq: Every day | RESPIRATORY_TRACT | Status: DC
  Administered 2018-07-02 – 2018-07-04 (×3): 1 via RESPIRATORY_TRACT
  Filled 2018-07-02: qty 7

## 2018-07-02 MED ORDER — ELVITEG-COBIC-EMTRICIT-TENOFAF 150-150-200-10 MG PO TABS: 1.0000 | ORAL_TABLET | Freq: Every day | ORAL | Status: DC

## 2018-07-02 NOTE — Progress Notes (Signed)
Pt noted to have BGL 346, no orders for insulin. MD made aware, awaiting orders.

## 2018-07-02 NOTE — Evaluation (Signed)
Physical Therapy Evaluation Patient Details Name: [name] MRN: [id number] DOB: 1965/07/29 Today's Date: 07/02/2018   History of Present Illness  Patient is a 53 y/o female s/p RIGHT ANKLE EX FIX REMOVAL, IRRIGATION AND DEBRIDEMENT AND SHORT LEG CAST on 07/01/2018. Past medical history significant of severe COPD on home O2, HVOS, CKD, HIV, DM2 s/p L BKA.     Clinical Impression  Patient admitted s/p above listed procedure. Patient reports she has been at [facility] where she requires assist for all mobility and ADLs. Patient today able to come to EOB and return to supine with Min A. Patient with subjective reports of headache and pain at R LE with patient declining OOB mobility. PT to follow acutely to maximize safe functional mobility with recommendations to return to SNF at discharge.     Follow Up Recommendations SNF    Equipment Recommendations  Other (comment)(TBD)    Recommendations for Other Services       Precautions / Restrictions Precautions Precautions: Fall Required Braces or Orthoses: Splint/Cast Splint/Cast: R ankle Restrictions Weight Bearing Restrictions: Yes RLE Weight Bearing: Non weight bearing      Mobility  Bed Mobility Overal bed mobility: Needs Assistance Bed Mobility: Supine to Sit;Sit to Supine     Supine to sit: HOB elevated;Min assist Sit to supine: Min assist;HOB elevated   General bed mobility comments: able to come to EOB with Min A for R LE management; pateint tearful throughout due to current situation and pain  Transfers                 General transfer comment: declined   Ambulation/Gait             General Gait Details: unable  Stairs            Wheelchair Mobility    Modified Rankin (Stroke Patients Only)       Balance Overall balance assessment: Mild deficits observed, not formally tested                                           Pertinent Vitals/Pain Pain Assessment: Faces Pain  Score: 10-Worst pain ever Pain Location: head, R ankle Pain Descriptors / Indicators: Aching;Discomfort;Crying;Grimacing Pain Intervention(s): Limited activity within patient's tolerance;Monitored during session;Premedicated before session;Repositioned    Home Living Family/patient expects to be discharged to:: Skilled nursing facility                      Prior Function Level of Independence: Needs assistance         Comments: reports she has only been performing bed mobility at SNF; at baseline uses a w/c - able to perform stand pivot transfers with L prothetic     Hand Dominance        Extremity/Trunk Assessment   Upper Extremity Assessment Upper Extremity Assessment: Overall WFL for tasks assessed    Lower Extremity Assessment Lower Extremity Assessment: RLE deficits/detail;LLE deficits/detail RLE Deficits / Details: in cast, able to flex toes and knee LLE Deficits / Details: L BKA    Cervical / Trunk Assessment Cervical / Trunk Assessment: Kyphotic  Communication   Communication: No difficulties  Cognition Arousal/Alertness: Awake/alert Behavior During Therapy: WFL for tasks assessed/performed Overall Cognitive Status: Within Functional Limits for tasks assessed  General Comments      Exercises     Assessment/Plan    PT Assessment Patient needs continued PT services  PT Problem List Decreased strength;Decreased range of motion;Decreased activity tolerance;Decreased mobility;Decreased knowledge of use of DME;Decreased safety awareness;Decreased balance;Pain       PT Treatment Interventions DME instruction;Functional mobility training;Therapeutic activities;Therapeutic exercise;Balance training;Patient/family education;Wheelchair mobility training    PT Goals (Current goals can be found in the Care Plan section)  Acute Rehab PT Goals Patient Stated Goal: reduce pain PT Goal Formulation: With  patient Time For Goal Achievement: 07/16/18 Potential to Achieve Goals: Good    Frequency Min 3X/week   Barriers to discharge        Co-evaluation               AM-PAC PT "6 Clicks" Mobility  Outcome Measure Help needed turning from your back to your side while in a flat bed without using bedrails?: A Little Help needed moving from lying on your back to sitting on the side of a flat bed without using bedrails?: A Little Help needed moving to and from a bed to a chair (including a wheelchair)?: Total Help needed standing up from a chair using your arms (e.g., wheelchair or bedside chair)?: Total Help needed to walk in hospital room?: Total Help needed climbing 3-5 steps with a railing? : Total 6 Click Score: 10    End of Session   Activity Tolerance: Patient tolerated treatment well Patient left: in bed;with call bell/phone within reach Nurse Communication: Mobility status PT Visit Diagnosis: Unsteadiness on feet (R26.81);Other abnormalities of gait and mobility (R26.89);History of falling (Z91.81);Pain Pain - Right/Left: Right Pain - part of body: Ankle and joints of foot    Time: 8756-4332 PT Time Calculation (min) (ACUTE ONLY): 16 min   Charges:   PT Evaluation $PT Eval Moderate Complexity: 1 Mod         Lanney Gins, PT, DPT Supplemental Physical Therapist 07/02/18 8:47 AM Pager: [phone] Office: [phone]

## 2018-07-02 NOTE — TOC Initial Note (Signed)
Transition of Care [facility]) - Initial/Assessment Note    Patient Details  Name: [name] MRN: [id number] Date of Birth: 1965/02/25  [facility] CM/SW Contact:    Alberteen Sam, [place] Phone Number: [phone] 07/02/2018, 3:48 PM  Clinical Narrative:                  CSW consulted with patient regarding discharge planning. Patient reports she is from [facility] and would like to return. She said she thinks her bed is being held there and would like CSW to follow up. CSW informed needing insurance auth through [facility] for patient to return, this will be initiated today. Patient has no further questions or concerns at this time.   Expected Discharge Plan: [place] Barriers to Discharge: Continued Medical Work up   Patient Goals and CMS Choice Patient states their goals for this hospitalization and ongoing recovery are:: to go back to [place] Medicare.gov Compare Post Acute Care list provided to:: Patient Choice offered to / list presented to : Patient  Expected Discharge Plan and Services Expected Discharge Plan: [place] Acute Care Choice: [place] Living arrangements for the past 2 months: [place]                                      Prior Living Arrangements/Services Living arrangements for the past 2 months: [place] Lives with:: Self Patient language and need for interpreter reviewed:: Yes Do you feel safe going back to the place where you live?: Yes      Need for Family Participation in Patient Care: No (Comment) Care giver support system in place?: Yes (comment)   Criminal Activity/Legal Involvement Pertinent to Current Situation/Hospitalization: No - Comment as needed  Activities of Daily Living Home Assistive Devices/Equipment: Cane (specify quad or straight), Walker (specify type), Wheelchair ADL Screening (condition at  time of admission) Patient's cognitive ability adequate to safely complete daily activities?: No Is the patient deaf or have difficulty hearing?: No Does the patient have difficulty seeing, even when wearing glasses/contacts?: No Does the patient have difficulty concentrating, remembering, or making decisions?: No Patient able to express need for assistance with ADLs?: Yes Does the patient have difficulty dressing or bathing?: No Independently performs ADLs?: Yes (appropriate for developmental age) Communication: Independent Dressing (OT): Independent Is this a change from baseline?: Pre-admission baseline Grooming: Independent Feeding: Independent Bathing: Independent Toileting: Independent with device (comment) Is this a change from baseline?: Pre-admission baseline In/Out Bed: Independent with device (comment) Is this a change from baseline?: Pre-admission baseline Walks in Home: Needs assistance Is this a change from baseline?: Pre-admission baseline Does the patient have difficulty walking or climbing stairs?: Yes Weakness of Legs: Both(left bka   right ankle fracture) Weakness of Arms/Hands: None  Permission Sought/Granted Permission sought to share information with : Case Freight forwarder, Investment banker, corporate granted to share info w AGENCY: SNFs        Emotional Assessment Appearance:: Other (Comment Required(remote- unable to assess) Attitude/Demeanor/Rapport: Gracious Affect (typically observed): Calm Orientation: : Oriented to Self, Oriented to Place, Oriented to  Time, Oriented to Situation Alcohol / Substance Use: Not Applicable Psych Involvement: No (comment)  Admission diagnosis:  right ankle fracture, wound Patient Active Problem List   Diagnosis Date Noted  .  Closed right ankle fracture 07/02/2018  . Anemia of chronic disease 07/02/2018  . Displaced bimalleolar fracture of right ankle, closed, initial encounter 06/02/2018  . Avascular  necrosis of bone of left hip ([place]) 04/03/2018  . Mucopurulent chronic bronchitis ([place]) 03/11/2018  . Allergic rhinitis 03/11/2018  . Anxiety and depression 12/29/2017  . Acute respiratory failure with hypoxia ([place]) 12/28/2017  . Syncope 08/29/2017  . DKA (diabetic ketoacidoses) ([place]) 03/02/2017  . COPD exacerbation ([place]) 02/23/2017  . Acute on chronic respiratory failure with hypoxia and hypercapnia ([place]) 08/19/2016  . Leukocytosis 08/19/2016  . CKD (chronic kidney disease), stage III ([place]) 08/19/2016  . Altered mental status 08/19/2016  . Asthma exacerbation 06/18/2016  . Insomnia 06/12/2016  . Difficult intravenous access   . Acute on chronic respiratory failure with hypoxia ([place]) 05/19/2016  . NSTEMI (non-ST elevated myocardial infarction) ([place]) 05/19/2016  . Acute metabolic encephalopathy 16/96/7893  . Hyperlipidemia   . Class 3 obesity due to excess calories with serious comorbidity and body mass index (BMI) of 40.0 to 44.9 in adult   . Obesity hypoventilation syndrome ([place])   . Diastolic dysfunction 81/01/7508  . Diabetic neuropathy ([place]) 11/10/2014  . Fracture of distal femur ([place]) 07/08/2014  . Osteoporosis 07/08/2014  . Femur fracture, left ([place]) 07/08/2014  . Colles' fracture of left radius   . Acute kidney injury superimposed on CKD ([place]) 05/03/2014  . Left hip pain 05/03/2014  . Perimenopausal symptoms 02/13/2012  . Hyponatremia 12/29/2011  . Asthma exacerbation attacks 12/28/2011  . Bronchitis 12/28/2011  . Repeated falls 07/30/2011  . Hip fracture, left ([place]) 07/16/2011  . Femoral neck fracture ([place]) 07/16/2011  . Tobacco abuse 04/14/2010  . Hyperlipidemia associated with type 2 diabetes mellitus ([place]) 12/23/2008  . COPD (chronic obstructive pulmonary disease) ([place]) 11/29/2008  . THRUSH 05/20/2008  . Human immunodeficiency virus (HIV) disease ([place]) 04/19/2008  . Diabetes mellitus type 2 with complications, uncontrolled ([place]) 04/19/2008  . Mood disorder ([place])  04/19/2008  . Hereditary and idiopathic peripheral neuropathy 04/19/2008  . Hypertension associated with diabetes ([place]) 04/19/2008   PCP:  [name], PA-C Pharmacy:  No Pharmacies Listed    Social Determinants of Health (SDOH) Interventions    Readmission Risk Interventions Readmission Risk Prevention Plan 06/17/2018  Transportation Screening Complete  Medication Review ([place]) Complete  PCP or Specialist appointment within 3-5 days of discharge Complete  HRI or Home Care Consult Complete  SW Recovery Care/Counseling Consult Complete  Palliative Care Screening Not [place] Complete  Some recent data might be hidden

## 2018-07-02 NOTE — H&P (Addendum)
History and Physical    [name] ASU:[id number] DOB: 1965/06/26 DOA: 07/01/2018  Referring MD/NP/PA: Eduard Roux, orthopedics PCP: [name], PA-C  Patient coming from: [facility]  Chief Complaint: Right ankle fracture  I have personally briefly reviewed patient's old medical records in [place]   HPI: [name] is a 54 y.o. female with medical history significant of COPD on home O2 3 L at night, CKD, HLD, DM type II, s/p left BKA, who presented  1 month after sustaining a right bimalleolar ankle fracture after fall for follow-up Dr Erlinda Hong office yesterday.  Patient reports that she has been staying at [facility].  She complained of having severe pain in the heel of her right ankle since having the external fixators placed during hospitalization on 6/1.  Dr. Erlinda Hong found the patient to have loosening of hardware and soft tissue compromise. TR[facility] was called to admit the patient as a direct admission by orthopedics yesterday.  Patient was taken to surgery yesterday to remove the external fixator, irrigate and debride  the right lower extremity, and place leg in a short cast.  There was some miscommunication for which orthopedics had   admitted thepatient yesterday, but we will take over care today.  Patient reports that she had been taken off of Invokana due to the history of amputations and was placed on Jardiance with significant improvement in blood sugars.  Patient also complains of blurry vision and itching eyes. She has been seen previously by optometrist and ophthalmologist.  At some point reports being placed on eyedrops which helped for some time, but eventually stopped helping.  ED Course: As seen above  Review of Systems  Constitutional: Negative for fever and weight loss.  HENT: Negative for congestion and nosebleeds.   Eyes: Positive for blurred vision.       Positive for itchy eyes  Respiratory: Negative for cough and shortness of breath.     Cardiovascular: Negative for chest pain.  Gastrointestinal: Negative for abdominal pain, nausea and vomiting.  Genitourinary: Negative for dysuria and hematuria.  Musculoskeletal: Positive for joint pain and myalgias.  Neurological: Negative for loss of consciousness.  Psychiatric/Behavioral: Negative for memory loss.  All other systems reviewed and are negative.   Past Medical History:  Diagnosis Date   Acute metabolic encephalopathy 4/32/7614   Acute on chronic respiratory failure with hypoxia ([place]) 05/19/2016   Anxiety    ARF (acute renal failure) ([place]) 05/03/2014   Arthritis    LEFT HIP   Asthma    HOSPITALIZED WITH EXCERBATION OF ASTHMA - AND BRONCHITIS AND THE FLU DEC 2013   Asthma exacerbation attacks 12/28/2011   Bronchitis 12/28/2011   CAP (community acquired pneumonia) 07/25/2012   Chronic kidney disease (CKD), stage IV (severe) ([place]) 11/10/2014   Class 3 obesity due to excess calories with serious comorbidity and body mass index (BMI) of 40.0 to 44.9 in adult    Colles' fracture of left radius    COPD (chronic obstructive pulmonary disease) ([place])    Depression    Diabetes mellitus    ON INSULIN AND ORAL MEDICATIONS   Diabetes mellitus type 2 with complications, uncontrolled ([place]) 04/19/2008   Qualifier: Diagnosis of  By: Tomma Lightning MD, Kelly      Diabetic foot infection ([place]) 11/09/2014   Diabetic ketoacidosis without coma associated with type 2 diabetes mellitus ([place])    Diabetic neuropathy ([place]) 11/10/2014   Diarrhea 08/30/2008   Qualifier: Diagnosis of  By: Tomma Lightning MD, Claiborne Billings  Diastolic dysfunction 38/08/8278   Difficult intravenous access    DKA, type 2 ([place]) 04/04/2016   Dyslipidemia 12/23/2008   Qualifier: Diagnosis of  By: Tomma Lightning MD, Claiborne Billings     Essential hypertension 04/19/2008   Qualifier: Diagnosis of  By: Tomma Lightning MD, [place] 07/12/2008   Qualifier: Diagnosis of  By: Tomma Lightning MD, Kelly     Femoral neck fracture ([place]) 07/16/2011    Femur fracture, left ([place]) 07/08/2014   Fever    Fracture of distal femur ([place]) 07/08/2014   GERD (gastroesophageal reflux disease)    Hereditary and idiopathic peripheral neuropathy 04/19/2008   Qualifier: Diagnosis of  By: Tomma Lightning MD, Kelly     Hip fracture, left ([place]) 07/16/2011   Hip pain    HIV infection ([place]) 2000   Human immunodeficiency virus (HIV) disease ([place]) 04/19/2008   [id number] +    Hyperlipidemia    Hypertension    Hyponatremia 12/29/2011   Influenza A 12/29/2011   Influenza B    Insomnia 06/12/2016   Left hip pain 05/03/2014   Mood disorder ([place]) 04/19/2008   Qualifier: Diagnosis of  By: Tomma Lightning MD, Kelly     Neuropathy    NEUROPATHY HANDS AND FEET   NSTEMI (non-ST elevated myocardial infarction) ([place]) 05/19/2016   Obesity hypoventilation syndrome ([place])    Osteoporosis 07/08/2014   Pain    SEVERE PAIN LEFT HIP - HX OF LEFT HIP PINNING JULY 2013   Perimenopausal symptoms 02/13/2012   LMP around 2011. On estrace and provera since around 2012 for hot flashes, mood swings. Estrace 2 mg daily, Provera 2.5 mg for 5 days each month.    Repeated falls 07/30/2011   Sepsis ([place]) 04/04/2016   Shortness of breath    ALLERGIES ARE "ACTING UP"   SOB (shortness of breath)    THRUSH 05/20/2008   Qualifier: Diagnosis of  By: Tomma Lightning MD, Claiborne Billings       Tobacco use disorder 04/14/2010    Past Surgical History:  Procedure Laterality Date   AMPUTATION Left 11/18/2014   Procedure: LEFT BELOW KNEE AMPUTATION;  Surgeon: Leandrew Koyanagi, MD;  Location: [facility];  Service: Orthopedics;  Laterality: Left;   CAST APPLICATION Left 0/03/4915   Procedure: CAST APPLICATION (FIBERGLASS);  Surgeon: Latanya Maudlin, MD;  Location[facility];  Service: Orthopedics;  Laterality: Left;  CLOSED REDUCTION OF LEFT COLLES FRACTURE WITH SHORT ARM CAST   EXTERNAL FIXATION LEG Right 06/02/2018   Procedure: EXTERNAL FIXATION LEG;  Surgeon: Leandrew Koyanagi, MD;  Location: [facility];  Service: Orthopedics;   Laterality: Right;   EXTERNAL FIXATION REMOVAL Right 07/01/2018   Procedure: REMOVAL EXTERNAL FIXATION LEG;  Surgeon: Leandrew Koyanagi, MD;  Location: [facility];  Service: Orthopedics;  Laterality: Right;   HARDWARE REMOVAL Left 05/05/2014   Procedure: HARDWARE REMOVAL LEFT HIP;  Surgeon: Latanya Maudlin, MD;  [facility];  Service: Orthopedics;  Laterality: Left;  REMOVAL BIOMET 6.5-8.0 CANNULATED SCREW   HIP ARTHROPLASTY Left 05/05/2014   Procedure: ARTHROPLASTY OPEN REDUCTION INTERNAL FIXATION LEFT HIP AND REMOVAL OF TWO CANNULATED SCREW;  Surgeon: Latanya Maudlin, MD;  [facility];  Service: Orthopedics;  Laterality: Left;   HIP PINNING,CANNULATED  07/16/2011   Procedure: CANNULATED HIP PINNING;  Surgeon: Gearlean Alf, MD;  [facility];  Service: Orthopedics;  Laterality: Left;   HIP PINNING,CANNULATED Left 04/09/2012   Procedure: CANNULATED HIP PINNING AND HARDWARE REVISION;  Surgeon: Gearlean Alf, MD;  [facility];  Service: Orthopedics;  Laterality: Left;   I&D EXTREMITY Left 11/16/2014   Procedure:  DEBRIDEMENT OF LEFT FOOT POSSIBLE APPLICATION OF INTEGRIA AND VAC ;  Surgeon: Irene Limbo, MD;  Location: [facility];  Service: Plastics;  Laterality: Left;   I&D EXTREMITY Right 07/01/2018   Procedure: RIGHT ANKLE EX FIX REMOVAL, IRRIGATION AND DEBRIDEMENT AND SHORT LEG CAST;  Surgeon: Leandrew Koyanagi, MD;  Location: [facility];  Service: Orthopedics;  Laterality: Right;   PERIPHERAL VASCULAR CATHETERIZATION N/A 11/15/2014   Procedure: Abdominal Aortogram;  Surgeon: Conrad Tryon, MD;  Location: [facility];  Service: Cardiovascular;  Laterality: N/A;   PERIPHERAL VASCULAR CATHETERIZATION  11/15/2014   Procedure: Lower Extremity Angiography;  Surgeon: Conrad Moyie Springs, MD;  Location: [facility];  Service: Cardiovascular;;   PERIPHERAL VASCULAR CATHETERIZATION Left 11/15/2014   Procedure: Peripheral Vascular Intervention;  Surgeon: Conrad Darlington, MD;  Location: [facility];  Service: Cardiovascular;  Laterality: Left;  popliteal artery stenting     reports that she quit smoking about 4 weeks ago. Her smoking use included e-cigarettes and cigarettes. She started smoking about 37 years ago. She has a 57.00 pack-year smoking history. She has never used smokeless tobacco. She reports that she does not drink alcohol or use drugs.  Allergies  Allergen Reactions   Cortizone-10 [Hydrocortisone] Rash    Per patient, given local injection at knee and developed rash at local site.     Family History  Problem Relation Age of Onset   Diabetes Mother    Hypertension Mother    Vision loss Mother    Heart disease Mother    Hypertension Father    Thyroid disease Neg Hx     Prior to Admission medications   Medication Sig Start Date End Date Taking? Authorizing Provider  albuterol (PROVENTIL HFA;VENTOLIN HFA) 108 (90 Base) MCG/ACT inhaler INHALE 2 PUFFS INTO THE LUNGS EVERY 6 HOURS AS NEEDED FOR WHEEZING OR SHORTNESS OF BREATH Patient taking differently: Inhale 2 puffs into the lungs every 6 (six) hours as needed for wheezing or shortness of breath.  10/03/17  Yes Medina-Vargas, Monina C, NP  empagliflozin (JARDIANCE) 10 MG TABS tablet Take 10 mg by mouth daily.   Yes [provider]  fluticasone (FLONASE) 50 MCG/ACT nasal spray Place 1 spray into both nostrils daily. 04/08/18  Yes Lauraine Rinne, NP  Fluticasone-Umeclidin-Vilant (TRELEGY ELLIPTA) 100-62.5-25 MCG/INH AEPB Inhale 1 puff into the lungs daily. 04/03/18  Yes Mack, Vinson Moselle, NP  GENVOYA 150-150-200-10 MG TABS tablet TAKE 1 TABLET BY MOUTH DAILY Patient taking differently: Take 1 tablet by mouth daily with breakfast. Give with food.  Do NOT crush. 05/30/18  Yes Hatcher, Doroteo Bradford, MD  HYDROcodone-acetaminophen (NORCO) 10-325 MG tablet Take 1 tablet by mouth 4 (four) times daily as needed (pain). Patient taking differently: Take 1 tablet by mouth every 4 (four) hours as needed for moderate pain.   06/23/18  Yes Aline August, MD  Insulin Glargine, 2 Unit Dial, (TOUJEO MAX SOLOSTAR) 300 UNIT/ML SOPN Inject 22 Units into the skin 2 (two) times daily. INJECT 50 UNITS UNDER THE SKIN TWICE DAILY. Patient taking differently: Inject 25 Units into the skin 2 (two) times daily.  06/23/18  Yes Aline August, MD  insulin regular human CONCENTRATED (HUMULIN R U-500 KWIKPEN) 500 UNIT/ML kwikpen Inject 10 Units into the skin 3 (three) times daily with meals. Patient taking differently: Inject 15-20 Units into the skin See admin instructions. Three times daily per Sliding Scale: 150-300  give 15 units 301-400 give 20 units 06/23/18  Yes Alekh, Kshitiz, MD  ipratropium-albuterol (DUONEB) 0.5-2.5 (3) MG/3ML SOLN Take 3 mLs by nebulization every 6 (six) hours. 01/01/18  Yes Regalado, Belkys A, MD  levocetirizine (XYZAL) 5 MG tablet Take 1 tablet daily for allergies. Patient taking differently: Take 5 mg by mouth daily.  04/25/18  Yes Lauraine Rinne, NP  magnesium oxide (MAG-OX) 400 MG tablet Take 400 mg by mouth daily.   Yes [provider]  montelukast (SINGULAIR) 10 MG tablet Take 1 tablet (10 mg total) by mouth at bedtime. 03/11/18  Yes Lauraine Rinne, NP  pantoprazole (PROTONIX) 40 MG tablet Take 1 tablet (40 mg total) by mouth daily. 10/03/17  Yes Medina-Vargas, Monina C, NP  polyethylene glycol (MIRALAX / GLYCOLAX) 17 g packet Take 17 g by mouth daily as needed for moderate constipation. 06/23/18  Yes Aline August, MD  pregabalin (LYRICA) 225 MG capsule Take 225 mg by mouth 2 (two) times daily.    Yes [provider]  rosuvastatin (CRESTOR) 20 MG tablet Take 1 tablet (20 mg total) by mouth daily. Reported on 05/02/2015 10/03/17  Yes Medina-Vargas, Monina C, NP  senna-docusate (SENOKOT-S) 8.6-50 MG tablet Take 1 tablet by mouth 2 (two) times daily. 06/23/18  Yes Aline August, MD  sertraline (ZOLOFT) 100 MG tablet Take 100 mg by mouth daily. (Give with 10m tablet for a total of 1568m   Yes  [provider]  sertraline (ZOLOFT) 50 MG tablet Take 50 mg by mouth daily. (Give with 10047mablet for a total of 150m35m Yes [provider]  bictegravir-emtricitabine-tenofovir AF (BIKTARVY) 50-200-25 MG TABS tablet Take 1 tablet by mouth daily. Patient not taking: Reported on 07/01/2018 06/04/18   HatcCampbell Riches  fluticasone furoate-vilanterol (BREO ELLIPTA) 100-25 MCG/INH AEPB INHALE 1 PUFF INTO THE LUNGS DAILY Patient not taking: Reported on 07/01/2018 12/26/17   SoodChesley Mires  glucose blood (ACCU-CHEK AVIVA) test strip Use as instructed to check blood sugar 2 times daily. 12/24/17   KumaElayne Snare    Physical Exam:  Constitutional: Obese female who appears to be in no acute distress Vitals:   07/01/18 2029 07/02/18 0028 07/02/18 0518 07/02/18 0841  BP: (!) 156/81 (!) 147/74 (!) 150/80 134/77  Pulse: 75 68 83 83  Resp: _0 Temp: 98.5 F (36.9 C) 97.9 F (36.6 C) 98 F (36.7 C) 99.2 F (37.3 C)  TempSrc: Oral Oral Oral Oral  SpO2: 95% 94% 97% 96%  Weight:      Height:       Eyes: PERRL, lids and conjunctivae normal ENMT: Mucous membranes are moist. Posterior pharynx clear of any exudate or lesions.   Neck: normal, supple, no masses, no thyromegaly Respiratory: clear to auscultation bilaterally, no wheezing, no crackles. Normal respiratory effort. No accessory muscle use.  Cardiovascular: Regular rate and rhythm, no murmurs / rubs / gallops. No extremity edema. 2+ pedal pulses. No carotid bruits.  Abdomen: no tenderness, no masses palpated. No hepatosplenomegaly. Bowel sounds positive.  Musculoskeletal: no clubbing / cyanosis.  Left BKA.  Right ankle in a hard cast. Skin: no rashes, lesions, ulcers. No induration Neurologic: CN 2-12 grossly intact. Sensation intact, DTR normal. Strength 5/5 in all 4.  Psychiatric: Normal judgment and insight. Alert and oriented x 3. Normal mood.     Labs on Admission: I have personally reviewed  following labs and imaging studies  CBC: Recent Labs  Lab 07/01/18 1900  WBC 12.5*  HGB 11.2*  HCT 37.2  MCV 87.9  PLT 676   Basic Metabolic Panel: Recent Labs  Lab 07/01/18 1900  CREATININE 1.01*   GFR: Estimated Creatinine Clearance: 83.9 mL/min (A) (by C-G formula based on SCr of 1.01 mg/dL (H)). Liver Function Tests: No results for input(s): AST, ALT, ALKPHOS, BILITOT, PROT, ALBUMIN in the last 168 hours. No results for input(s): LIPASE, AMYLASE in the last 168 hours. No results for input(s): AMMONIA in the last 168 hours. Coagulation Profile: No results for input(s): INR, PROTIME in the last 168 hours. Cardiac Enzymes: No results for input(s): CKTOTAL, CKMB, CKMBINDEX, TROPONINI in the last 168 hours. BNP (last 3 results) No results for input(s): PROBNP in the last 8760 hours. HbA1C: No results for input(s): HGBA1C in the last 72 hours. CBG: Recent Labs  Lab 07/01/18 1502 07/01/18 1727 07/02/18 0836  GLUCAP 187* 230* 346*   Lipid Profile: No results for input(s): CHOL, HDL, LDLCALC, TRIG, CHOLHDL, LDLDIRECT in the last 72 hours. Thyroid Function Tests: No results for input(s): TSH, T4TOTAL, FREET4, T3FREE, THYROIDAB in the last 72 hours. Anemia Panel: No results for input(s): VITAMINB12, FOLATE, FERRITIN, TIBC, IRON, RETICCTPCT in the last 72 hours. Urine analysis:    Component Value Date/Time   COLORURINE AMBER (A) 06/05/2018 1900   APPEARANCEUR CLOUDY (A) 06/05/2018 1900   LABSPEC 1.023 06/05/2018 1900   PHURINE 6.0 06/05/2018 1900   GLUCOSEU 50 (A) 06/05/2018 1900   GLUCOSEU 250 (A) 03/16/2015 1612   HGBUR SMALL (A) 06/05/2018 1900   BILIRUBINUR NEGATIVE 06/05/2018 1900   KETONESUR 20 (A) 06/05/2018 1900   PROTEINUR 100 (A) 06/05/2018 1900   UROBILINOGEN 0.2 03/16/2015 1612   NITRITE POSITIVE (A) 06/05/2018 1900   LEUKOCYTESUR LARGE (A) 06/05/2018 1900   Sepsis Labs: Recent Results (from the past 240 hour(s))  SARS Coronavirus 2 (CEPHEID -  Performed in [facility] hospital lab), Hosp Order     Status: None   Collection Time: 07/01/18 12:26 PM   Specimen: Nasopharyngeal Swab  Result Value Ref Range Status   SARS Coronavirus 2 NEGATIVE NEGATIVE Final    Comment: (NOTE) If result is NEGATIVE SARS-CoV-2 target nucleic acids are NOT DETECTED. The SARS-CoV-2 RNA is generally detectable in upper and lower  respiratory specimens during the acute phase of infection. The lowest  concentration of SARS-CoV-2 viral copies this assay can detect is 250  copies / mL. A negative result does not preclude SARS-CoV-2 infection  and should not be used as the sole basis for treatment or other  patient management decisions.  A negative result may occur with  improper specimen collection / handling, submission of specimen other  than nasopharyngeal swab, presence of viral mutation(s) within the  areas targeted by this assay, and inadequate number of viral copies  (<250 copies / mL). A negative result must be combined with clinical  observations, patient history, and epidemiological information. If result is POSITIVE SARS-CoV-2 target nucleic acids are DETECTED. The SARS-CoV-2 RNA is generally detectable in upper and lower  respiratory specimens dur ing the acute phase of infection.  Positive  results are indicative of active infection with SARS-CoV-2.  Clinical  correlation with patient history and other diagnostic information is  necessary to determine patient infection status.  Positive results do  not rule out bacterial infection or co-infection with other viruses. If result is PRESUMPTIVE POSTIVE SARS-CoV-2 nucleic acids MAY BE PRESENT.   A presumptive positive result was obtained on the submitted specimen  and confirmed  on repeat testing.  While 2019 novel coronavirus  (SARS-CoV-2) nucleic acids may be present in the submitted sample  additional confirmatory testing may be necessary for epidemiological  and / or clinical management  purposes  to differentiate between  SARS-CoV-2 and other Sarbecovirus currently known to infect humans.  If clinically indicated additional testing with an alternate test  methodology [phone]) is advised. The SARS-CoV-2 RNA is generally  detectable in upper and lower respiratory sp ecimens during the acute  phase of infection. The expected result is Negative. Fact Sheet for Patients:  StrictlyIdeas.no Fact Sheet for Healthcare Providers: BankingDealers.co.za This test is not yet approved or cleared by the [place] FDA and has been authorized for detection and/or diagnosis of SARS-CoV-2 by FDA under an Emergency Use Authorization (EUA).  This EUA will remain in effect (meaning this test can be used) for the duration of the COVID-19 declaration under Section 564(b)(1) of the Act, 21 U.S.C. section 360bbb-3(b)(1), unless the authorization is terminated or revoked sooner. Performed at [facility], [place] [address], [place], [place] [address]      Radiological Exams on Admission: Dg Ankle Complete Right  Result Date: 07/01/2018 CLINICAL DATA:  53 year old female undergoing external fixation removal. EXAM: RIGHT ANKLE - COMPLETE 3+ VIEW; DG C-ARM 61-120 MIN COMPARISON:  06/16/2018. FLUOROSCOPY TIME:  0 minutes 6 seconds FINDINGS: Three fluoroscopic spot views of the right ankle. External fixator hardware seen on 06/16/2018 has been removed. There remains a mildly displaced fracture of the distal right fibula. Mortise joint alignment otherwise appears preserved. IMPRESSION: External fixation removal from about the right ankle. Mildly displaced distal right fibula fracture. Electronically Signed   By: Genevie Ann M.D.   On: 07/01/2018 17:28   Dg C-arm 1-60 Min  Result Date: 07/01/2018 CLINICAL DATA:  53 year old female undergoing external fixation removal. EXAM: RIGHT ANKLE - COMPLETE 3+ VIEW; DG C-ARM 61-120 MIN COMPARISON:  06/16/2018.  FLUOROSCOPY TIME:  0 minutes 6 seconds FINDINGS: Three fluoroscopic spot views of the right ankle. External fixator hardware seen on 06/16/2018 has been removed. There remains a mildly displaced fracture of the distal right fibula. Mortise joint alignment otherwise appears preserved. IMPRESSION: External fixation removal from about the right ankle. Mildly displaced distal right fibula fracture. Electronically Signed   By: Genevie Ann M.D.   On: 07/01/2018 17:28   Xr Ankle Complete Right  Result Date: 07/01/2018 Loosening of the transfixing calcaneal pin.  The bimalleolar ankle fracture alignment appears to be stable.   Assessment/Plan Displaced bimalleolar fracture of right ankle, closed, initial encounter: Patient was initially placed an external fixators are in 6 1 hospitalization, but on follow-up visit noted to have soft tissue compromise and loosening of the hardware.  Taken back to surgery on 6/30 with removal of fixator, I&D, and placement in a short cast by Dr. Erlinda Hong.  Patient reports pain currently uncontrolled. -Admitted to a medical bed -Appreciate orthopedics consultative services  -Continue orders surrounding recent surgery per Ortho  -Continue home oxycodone and increase IV morphine to 2 mg as needed for pain control.  Leukocytosis: WBC elevated at 11.4 today and trending down from 12.5 on admission.  Suspect secondary to above. -Continue to monitor  Diabetes mellitus type 2: Uncontrolled last hemoglobin A1c noted to be 8.6 on 01/21/2018.  Patient reports that she was placed on Jardiance with improvement of blood sugars.  Blood glucose is elevated into the 300s on admission. -Hypoglycemic protocols -Recheck hemoglobin A1c  -Continue 25 units of glargine twice daily  -  CBGs q. before meals and at bedtime with resistant SSI -8 units of insulin scheduled with meals per recommendations from diabetic coordinator  Acute kidney injury superimposed on chronic kidney disease stage II: Creatinine  noted to be 1.43 with BUN 26 on hospital day 2.  Previously noted to be 1.01.  Suspect likely prerenal in association with patient's elevated blood glucose levels. -IV fluids at 100 mL/h overnight -Recheck creatinine in a.m.   Anemia of chronic disease: Hemoglobin trending down from 11.2 down to 10.5 today with elevated RDW.  Suspect drop likely related with recent surgery.  Elevated RDW to suggest aspect of iron deficiency. -Check iron studies and repeat CBC in a.m.  COPD, without acute exacerbation: Patient reports only using oxygen of 3 L at night.  No significant symptoms of respiratory distress or wheezing noted on physical exam. -Continue oxygen 3 L at night -Continue trilogy and albuterol nebs as needed for shortness of breath/wheezing  HIV: Per review of records patient was to have been changed over to -Discontinue Genvoya and start biktarvy  Chronic pain -Continue Lyrica, hydrocodone as needed pain  Obesity: BMI 33.58 kg/m  DVT prophylaxis: Lovenox Code Status: Full Family Communication: No family present at bedside Disposition Plan: Likely discharge back to [facility] once medically stable Consults called: Orthopedic Admission status: Given the complexity of the patient medical history and need of continuation of IV fluids, pain control and blood glucose control for appropriate wound healing following surgery and change patient to inpatient.  Norval Morton MD [facility] Pager [phone]   If 7PM-7AM, please contact night-coverage [email or web address] Password [facility]  07/02/2018, 8:46 AM

## 2018-07-02 NOTE — Progress Notes (Signed)
Pt has a L BKA, and has a cast on R lower leg.

## 2018-07-02 NOTE — Plan of Care (Signed)

## 2018-07-02 NOTE — Progress Notes (Signed)
Inpatient Diabetes Program Recommendations  AACE/ADA: New Consensus Statement on Inpatient Glycemic Control (2015)  Target Ranges:  Prepandial:   less than 140 mg/dL      Peak postprandial:   less than 180 mg/dL (1-2 hours)      Critically ill patients:  140 - 180 mg/dL   Lab Results  Component Value Date   GLUCAP 383 (H) 07/02/2018   HGBA1C 8.3 (H) 07/02/2018    Review of Glycemic Control Results for KENNADY, ZIMMERLE (MRN [id number]) as of 07/02/2018 12:10  Ref. Range 07/01/2018 17:27 07/02/2018 08:36 07/02/2018 11:49  Glucose-Capillary Latest Ref Range: 70 - 99 mg/dL 230 (H) 346 (H) 383 (H)   Diabetes history:DM 2, Last saw Dr Dwyane Dee, endocrinology on 05/30/2018 Outpatient Diabetes medications: Invokana 100 mg Daily, Humulin R U-500 10 units TID, Toujeo 25 units bid  Current orders for Inpatient glycemic control: Lantus25units bid, Novolog 0-20 units tid  Inpatient Diabetes Program Recommendations:  Noted patient received Decadron 4 mg x1 and patient missed QHS dose of basal insulin on 6/30 thus explaining increases to glucose trends this AM with a FSBG of 346 mg/dL. Additionally, AM insulin given later than scheduled.  Per last hospitalization on 06/20/2018, patient also had Novolog 8 units TID and Novolog 0-5 units QHS ordered without lows and met inpatient glucose goals, may want to consider adding this at this time.   Will follow.   Thanks, Bronson Curb, MSN, RNC-OB Diabetes Coordinator [phone] (8a-5p)

## 2018-07-02 NOTE — Progress Notes (Signed)
Pt asked if her lyrica could be given while she was hospitalized. Paged MD, awaiting response.

## 2018-07-02 NOTE — Progress Notes (Signed)
Subjective: 1 Day Post-Op Procedure(s) (LRB): RIGHT ANKLE EX FIX REMOVAL, IRRIGATION AND DEBRIDEMENT AND SHORT LEG CAST (Right) REMOVAL EXTERNAL FIXATION LEG (Right) Patient reports pain as moderate.  Wanting to restart lyrica.  Has not been elevating RLE.  Objective: Vital signs in last 24 hours: Temp:  [97.9 F (36.6 C)-98.7 F (37.1 C)] 98 F (36.7 C) (07/01 0518) Pulse Rate:  [68-83] 83 (07/01 0518) Resp:  [14-29] 14 (07/01 0518) BP: (147-174)/(74-87) 150/80 (07/01 0518) SpO2:  [94 %-97 %] 97 % (07/01 0518) Weight:  [106.1 kg] 106.1 kg (06/30 1535)  Intake/Output from previous day: 06/30 0701 - 07/01 0700 In: 980 [P.O.:480; I.V.:400; IV Piggyback:100] Out: 2230 [Urine:2200; Blood:30] Intake/Output this shift: Total I/O In: -  Out: 600 [Urine:600]  Recent Labs    07/01/18 1900  HGB 11.2*   Recent Labs    07/01/18 1900  WBC 12.5*  RBC 4.23  HCT 37.2  PLT 348   Recent Labs    07/01/18 1900  CREATININE 1.01*   No results for input(s): LABPT, INR in the last 72 hours.  Neurologically intact Dorsiflexion/Plantar flexion intact  SLC in place RLE Able to wiggle toes.  Decreased sensation to toes (baseline)   Assessment/Plan: 1 Day Post-Op Procedure(s) (LRB): RIGHT ANKLE EX FIX REMOVAL, IRRIGATION AND DEBRIDEMENT AND SHORT LEG CAST (Right) REMOVAL EXTERNAL FIXATION LEG (Right) Up with therapy  NWB RLE ELEVATE OPERATIVE EXTREMITY AT ALL TIMES D/c dispo per medicine team  F/u with Dr. Erlinda Hong 2 weeks post-op for wound check      Aundra Dubin 07/02/2018, 7:44 AM

## 2018-07-02 NOTE — Plan of Care (Signed)
  Problem: Pain Managment: Goal: General experience of comfort will improve Outcome: Progressing   Problem: Safety: Goal: Ability to remain free from injury will improve Outcome: Progressing   

## 2018-07-02 NOTE — Progress Notes (Signed)
ID Pharmacy Note  Spoke with [name] about switching her HIV regimen from [facility] to [facility] to avoid current and future drug drug interactions. She was agreeable to the switch and I answered all questions that she had.   I also called [facility] to discuss this medication change and had them update her MAR.    Jimmy Footman, PharmD, BCPS, BCIDP Infectious Diseases Clinical Pharmacist Phone: [phone] 07/02/2018 2:16 PM

## 2018-07-02 NOTE — NC FL2 (Signed)
[place] MEDICAID FL2 LEVEL OF CARE SCREENING TOOL     IDENTIFICATION  Patient Name: [name] Birthdate: 1965/04/28 Sex: female Admission Date (Current Location): 07/01/2018  [facility] and [place] Number:  Herbalist and Address:  [facility], [place] [address] [place], [place] [address]      Provider Number: [id number]  Attending Physician Name and Address:  Norval Morton, MD  Relative Name and Phone Number:  Abe People [phone]    Current Level of Care: Hospital Recommended Level of Care: [facility] Prior Approval Number:    Date Approved/Denied:   PASRR Number: [id number]  Discharge Plan: SNF    Current Diagnoses: Patient Active Problem List   Diagnosis Date Noted  . Closed right ankle fracture 07/02/2018  . Anemia of chronic disease 07/02/2018  . Displaced bimalleolar fracture of right ankle, closed, initial encounter 06/02/2018  . Avascular necrosis of bone of left hip ([place]) 04/03/2018  . Mucopurulent chronic bronchitis ([place]) 03/11/2018  . Allergic rhinitis 03/11/2018  . Anxiety and depression 12/29/2017  . Acute respiratory failure with hypoxia ([place]) 12/28/2017  . Syncope 08/29/2017  . DKA (diabetic ketoacidoses) ([place]) 03/02/2017  . COPD exacerbation ([place]) 02/23/2017  . Acute on chronic respiratory failure with hypoxia and hypercapnia ([place]) 08/19/2016  . Leukocytosis 08/19/2016  . CKD (chronic kidney disease), stage III ([place]) 08/19/2016  . Altered mental status 08/19/2016  . Asthma exacerbation 06/18/2016  . Insomnia 06/12/2016  . Difficult intravenous access   . Acute on chronic respiratory failure with hypoxia ([place]) 05/19/2016  . NSTEMI (non-ST elevated myocardial infarction) ([place]) 05/19/2016  . Acute metabolic encephalopathy 40/81/4481  . Hyperlipidemia   . Class 3 obesity due to excess calories with serious comorbidity and body mass index (BMI) of 40.0 to 44.9 in adult   . Obesity  hypoventilation syndrome ([place])   . Diastolic dysfunction 85/63/1497  . Diabetic neuropathy ([place]) 11/10/2014  . Fracture of distal femur ([place]) 07/08/2014  . Osteoporosis 07/08/2014  . Femur fracture, left ([place]) 07/08/2014  . Colles' fracture of left radius   . Acute kidney injury superimposed on CKD ([place]) 05/03/2014  . Left hip pain 05/03/2014  . Perimenopausal symptoms 02/13/2012  . Hyponatremia 12/29/2011  . Asthma exacerbation attacks 12/28/2011  . Bronchitis 12/28/2011  . Repeated falls 07/30/2011  . Hip fracture, left ([place]) 07/16/2011  . Femoral neck fracture ([place]) 07/16/2011  . Tobacco abuse 04/14/2010  . Hyperlipidemia associated with type 2 diabetes mellitus ([place]) 12/23/2008  . COPD (chronic obstructive pulmonary disease) ([place]) 11/29/2008  . THRUSH 05/20/2008  . Human immunodeficiency virus (HIV) disease ([place]) 04/19/2008  . Diabetes mellitus type 2 with complications, uncontrolled ([place]) 04/19/2008  . Mood disorder ([place]) 04/19/2008  . Hereditary and idiopathic peripheral neuropathy 04/19/2008  . Hypertension associated with diabetes ([place]) 04/19/2008    Orientation RESPIRATION BLADDER Height & Weight     Self, Time, Situation, Place  Normal Continent, External catheter Weight: 234 lb (106.1 kg) Height:  5\' 10"  (177.8 cm)  BEHAVIORAL SYMPTOMS/MOOD NEUROLOGICAL BOWEL NUTRITION STATUS      Continent Diet(see discharge summary)  AMBULATORY STATUS COMMUNICATION OF NEEDS Skin   Extensive Assist Verbally Skin abrasions(abrasion left leg, right leg closed surgical incision)                       Personal Care Assistance Level of Assistance  Bathing, Dressing, Total care, Feeding Bathing Assistance: Limited assistance Feeding assistance: Independent Dressing Assistance: Limited assistance Total Care  Assistance: Maximum assistance   Functional Limitations Info  Sight, Hearing, Speech Sight Info: Adequate Hearing Info: Adequate Speech Info: Adequate    SPECIAL CARE  FACTORS FREQUENCY  PT (By licensed PT), OT (By licensed OT)     PT Frequency: min 5x weekly OT Frequency: min 5x weekly            Contractures Contractures Info: Not present    Additional Factors Info  Code Status, Allergies, Isolation Precautions Code Status Info: full Allergies Info: Cortizone-10 (Hydrocortisone)     Isolation Precautions Info: MRSA     Current Medications (07/02/2018):  This is the current hospital active medication list Current Facility-Administered Medications  Medication Dose Route Frequency Provider Last Rate Last Dose  . 0.9 %  sodium chloride infusion   Intravenous Continuous Tamala Julian, Rondell A, MD 100 mL/hr at 07/02/18 1030    . acetaminophen (TYLENOL) tablet 325-650 mg  325-650 mg Oral Q6H PRN Fuller Plan A, MD      . acetaminophen (TYLENOL) tablet 500 mg  500 mg Oral Q6H Smith, Rondell A, MD   500 mg at 07/02/18 3818  . bictegravir-emtricitabine-tenofovir AF (BIKTARVY) 50-200-25 MG per tablet 1 tablet  1 tablet Oral Daily Fuller Plan A, MD   1 tablet at 07/02/18 1357  . diphenhydrAMINE (BENADRYL) 12.5 MG/5ML elixir 25 mg  25 mg Oral Q4H PRN Fuller Plan A, MD   25 mg at 07/02/18 0031  . docusate sodium (COLACE) capsule 100 mg  100 mg Oral BID Fuller Plan A, MD   100 mg at 07/01/18 2122  . enoxaparin (LOVENOX) injection 55 mg  0.5 mg/kg Subcutaneous Q24H Tamala Julian, Rondell A, MD   55 mg at 07/02/18 0815  . fluticasone furoate-vilanterol (BREO ELLIPTA) 100-25 MCG/INH 1 puff  1 puff Inhalation Daily Tamala Julian, Rondell A, MD   1 puff at 07/02/18 1207   And  . umeclidinium bromide (INCRUSE ELLIPTA) 62.5 MCG/INH 1 puff  1 puff Inhalation Daily Fuller Plan A, MD   1 puff at 07/02/18 1207  . HYDROcodone-acetaminophen (NORCO) 10-325 MG per tablet 1 tablet  1 tablet Oral Q4H PRN Norval Morton, MD   1 tablet at 07/02/18 1202  . insulin aspart (novoLOG) injection 0-20 Units  0-20 Units Subcutaneous TID WC Fuller Plan A, MD   20 Units at 07/02/18 1203  .  insulin aspart (novoLOG) injection 0-5 Units  0-5 Units Subcutaneous QHS Smith, Rondell A, MD      . insulin aspart (novoLOG) injection 8 Units  8 Units Subcutaneous TID WC Smith, Rondell A, MD      . insulin glargine (LANTUS) injection 25 Units  25 Units Subcutaneous BID Fuller Plan A, MD   25 Units at 07/02/18 1210  . ipratropium-albuterol (DUONEB) 0.5-2.5 (3) MG/3ML nebulizer solution 3 mL  3 mL Nebulization Q6H PRN Smith, Rondell A, MD      . magnesium citrate solution 1 Bottle  1 Bottle Oral Once PRN Tamala Julian, Rondell A, MD      . magnesium oxide (MAG-OX) tablet 400 mg  400 mg Oral Daily Tamala Julian, Rondell A, MD   400 mg at 07/02/18 0958  . methocarbamol (ROBAXIN) tablet 500 mg  500 mg Oral Q6H PRN Fuller Plan A, MD   500 mg at 07/02/18 1202   Or  . methocarbamol (ROBAXIN) 500 mg in dextrose 5 % 50 mL IVPB  500 mg Intravenous Q6H PRN Smith, Rondell A, MD      . metoCLOPramide (REGLAN) tablet 5-10 mg  5-10  mg Oral Q8H PRN Fuller Plan A, MD       Or  . metoCLOPramide (REGLAN) injection 5-10 mg  5-10 mg Intravenous Q8H PRN Smith, Rondell A, MD      . montelukast (SINGULAIR) tablet 10 mg  10 mg Oral QHS Smith, Rondell A, MD      . morphine 2 MG/ML injection 2 mg  2 mg Intravenous Q2H PRN Fuller Plan A, MD   2 mg at 07/02/18 1428  . ondansetron (ZOFRAN) tablet 4 mg  4 mg Oral Q6H PRN Fuller Plan A, MD       Or  . ondansetron (ZOFRAN) injection 4 mg  4 mg Intravenous Q6H PRN Smith, Rondell A, MD      . pantoprazole (PROTONIX) EC tablet 40 mg  40 mg Oral Daily Tamala Julian, Rondell A, MD   40 mg at 07/02/18 0959  . polyethylene glycol (MIRALAX / GLYCOLAX) packet 17 g  17 g Oral Daily PRN Smith, Rondell A, MD      . polyvinyl alcohol (LIQUIFILM TEARS) 1.4 % ophthalmic solution 1 drop  1 drop Both Eyes PRN Fuller Plan A, MD   1 drop at 07/02/18 1211  . pregabalin (LYRICA) capsule 225 mg  225 mg Oral BID Fuller Plan A, MD   225 mg at 07/02/18 0958  . rosuvastatin (CRESTOR) tablet 20 mg  20 mg  Oral Daily Fuller Plan A, MD   20 mg at 07/02/18 0959  . sertraline (ZOLOFT) tablet 150 mg  150 mg Oral Daily Fuller Plan A, MD   150 mg at 07/02/18 0958  . sorbitol 70 % solution 30 mL  30 mL Oral Daily PRN Norval Morton, MD         Discharge Medications: Please see discharge summary for a list of discharge medications.  Relevant Imaging Results:  Relevant Lab Results:   Additional Information SSN: [id number]  Alberteen Sam, LCSW

## 2018-07-03 DIAGNOSIS — S82841A Displaced bimalleolar fracture of right lower leg, initial encounter for closed fracture: Secondary | ICD-10-CM

## 2018-07-03 LAB — CBC WITH DIFFERENTIAL/PLATELET
Abs Immature Granulocytes: 0.09 10*3/uL — ABNORMAL HIGH (ref 0.00–0.07)
Basophils Absolute: 0.1 10*3/uL (ref 0.0–0.1)
Basophils Relative: 1 %
Eosinophils Absolute: 0.1 10*3/uL (ref 0.0–0.5)
Eosinophils Relative: 1 %
HCT: 35.3 % — ABNORMAL LOW (ref 36.0–46.0)
Hemoglobin: 10.7 g/dL — ABNORMAL LOW (ref 12.0–15.0)
Immature Granulocytes: 1 %
Lymphocytes Relative: 20 %
Lymphs Abs: 2 10*3/uL (ref 0.7–4.0)
MCH: 26.4 pg (ref 26.0–34.0)
MCHC: 30.3 g/dL (ref 30.0–36.0)
MCV: 87.2 fL (ref 80.0–100.0)
Monocytes Absolute: 0.8 10*3/uL (ref 0.1–1.0)
Monocytes Relative: 8 %
Neutro Abs: 7.1 10*3/uL (ref 1.7–7.7)
Neutrophils Relative %: 69 %
Platelets: 385 10*3/uL (ref 150–400)
RBC: 4.05 MIL/uL (ref 3.87–5.11)
RDW: 18 % — ABNORMAL HIGH (ref 11.5–15.5)
WBC: 10.2 10*3/uL (ref 4.0–10.5)
nRBC: 0 % (ref 0.0–0.2)

## 2018-07-03 LAB — IRON AND TIBC
Iron: 20 ug/dL — ABNORMAL LOW (ref 28–170)
Saturation Ratios: 5 % — ABNORMAL LOW (ref 10.4–31.8)
TIBC: 374 ug/dL (ref 250–450)
UIBC: 354 ug/dL

## 2018-07-03 LAB — GLUCOSE, CAPILLARY
Glucose-Capillary: 305 mg/dL — ABNORMAL HIGH (ref 70–99)
Glucose-Capillary: 244 mg/dL — ABNORMAL HIGH (ref 70–99)
Glucose-Capillary: 193 mg/dL — ABNORMAL HIGH (ref 70–99)
Glucose-Capillary: 183 mg/dL — ABNORMAL HIGH (ref 70–99)

## 2018-07-03 LAB — BASIC METABOLIC PANEL
Anion gap: 9 (ref 5–15)
BUN: 32 mg/dL — ABNORMAL HIGH (ref 6–20)
CO2: 26 mmol/L (ref 22–32)
Calcium: 8.5 mg/dL — ABNORMAL LOW (ref 8.9–10.3)
Chloride: 100 mmol/L (ref 98–111)
Creatinine, Ser: 1.18 mg/dL — ABNORMAL HIGH (ref 0.44–1.00)
GFR calc Af Amer: >60 mL/min (ref >60)
GFR calc non Af Amer: 52 mL/min — ABNORMAL LOW (ref >60)
Glucose, Bld: 333 mg/dL — ABNORMAL HIGH (ref 70–99)
Potassium: 4 mmol/L (ref 3.5–5.1)
Sodium: 135 mmol/L (ref 135–145)

## 2018-07-03 LAB — FERRITIN: Ferritin: 68 ng/mL (ref 11–307)

## 2018-07-03 NOTE — Progress Notes (Signed)
Results for EMANI, TAUSSIG (MRN [id number]) as of 07/03/2018 11:25  Ref. Range 07/02/2018 08:36 07/02/2018 11:49 07/02/2018 17:25 07/02/2018 22:36 07/03/2018 07:56  Glucose-Capillary Latest Ref Range: 70 - 99 mg/dL 346 (H) 383 (H) 272 (H) 250 (H) 305 (H)  Noted that blood sugars continue to be greater than 200 mg/dl.  Recommend increasing Lantus to 30 units BID and continuing Novolog RESISTANT correction scale TID & HS and Novolog 8 Units TID.   Harvel Ricks RN BSN CDE Diabetes Coordinator Pager: [phone]  8am-5pm

## 2018-07-03 NOTE — Plan of Care (Signed)
  Problem: Education: °Goal: Knowledge of General Education information will improve °Description: Including pain rating scale, medication(s)/side effects and non-pharmacologic comfort measures °Outcome: Progressing °  °Problem: Clinical Measurements: °Goal: Will remain free from infection °Outcome: Progressing °Goal: Cardiovascular complication will be avoided °Outcome: Progressing °  °Problem: Activity: °Goal: Risk for activity intolerance will decrease °Outcome: Progressing °  °Problem: Coping: °Goal: Level of anxiety will decrease °Outcome: Progressing °  °Problem: Pain Managment: °Goal: General experience of comfort will improve °Outcome: Progressing °  °

## 2018-07-03 NOTE — Progress Notes (Signed)
Patient Demographics:    [name], is a 53 y.o. female, DOB - Dec 19, 1965, QQP:[id number]  Admit date - 07/01/2018   Admitting Physician Norval Morton, MD  Outpatient Primary MD for the patient is [name], PA-C  LOS - 1   No chief complaint on file.       Subjective:    [name] today has no fevers, no emesis,  No chest pain,     Assessment  & Plan :    Principal Problem:   Displaced bimalleolar fracture of right ankle, closed, initial encounter Active Problems:   Human immunodeficiency virus (HIV) disease ([place])   Diabetes mellitus type 2 with complications, uncontrolled ([place])   COPD (chronic obstructive pulmonary disease) ([place])   Acute kidney injury superimposed on CKD ([name])   Closed right ankle fracture   Anemia of chronic disease  Brief Summary 53 y.o. female with medical history significant of COPD on home O2 3 L at night, CKD, HLD, DM type II, s/p left BKA, who presented  1 month after sustaining a right bimalleolar ankle fracture after fall for follow-up Dr Erlinda Hong office yesterday.  Patient reports that she has been staying at [facility].  She complained of having severe pain in the heel of her right ankle since having the external fixators placed during hospitalization on 6/1.  Dr. Erlinda Hong found the patient to have loosening of hardware and soft tissue compromise.  A/p 1)bimalleolar fracture of right ankle--external fixators were placed 06/02/2018, went back to the OR 07/01/2018 for removal of fixator due to loosening of the hardware and soft tissue compromise--- further management per orthopedic team  2)DM2--A1c 8.3 this admission,  continue current insulin regimen including Use Novolog/Humalog Sliding scale insulin with Accu-Cheks/Fingersticks as ordered   3)AKI--due to dehydration, creatinine is down to 1.1 from 1.4, avoid nephrotoxic agents  3)Acute blood loss  anemia--hemoglobin is down to 10.7 monitor closely transfuse as needed  4) chronic hypoxic respiratory failure secondary to underlying COPD--- patient uses oxygen and trilogy machine, continue 2 L of oxygen nightly, continue trilogy machine as well as bronchodilators  5)H/o HIV--switched from [place] to [place]  6) chronic pain syndrome, continue hydrocodone and Lyrica  Disposition/Need for in-Hospital Stay- patient unable to be discharged at this time due to awaiting SNF placement*  Code Status : Full  Family Communication:   NA (patient is alert, awake and coherent)  Disposition Plan  : SNF  PROCEDURE:  07/01/2018 1. Removal of right ankle external fixator under general anesthesia 2. Irrigation and debridement of right lower extremity including bone, skin, subcutaneous tissue 3. Application of right short leg cast  Consults  :  ortho  DVT Prophylaxis  :  Lovenox  - SCDs   Lab Results  Component Value Date   PLT 385 07/03/2018    Inpatient Medications  Scheduled Meds: . bictegravir-emtricitabine-tenofovir AF  1 tablet Oral Daily  . docusate sodium  100 mg Oral BID  . enoxaparin (LOVENOX) injection  0.5 mg/kg Subcutaneous Q24H  . fluticasone furoate-vilanterol  1 puff Inhalation Daily   And  . umeclidinium bromide  1 puff Inhalation Daily  . insulin aspart  0-20 Units Subcutaneous TID WC  . insulin aspart  0-5 Units Subcutaneous QHS  . insulin  aspart  8 Units Subcutaneous TID WC  . insulin glargine  25 Units Subcutaneous BID  . magnesium oxide  400 mg Oral Daily  . montelukast  10 mg Oral QHS  . pantoprazole  40 mg Oral Daily  . pregabalin  225 mg Oral BID  . rosuvastatin  20 mg Oral Daily  . sertraline  150 mg Oral Daily   Continuous Infusions: . sodium chloride 100 mL/hr at 07/03/18 1623  . methocarbamol (ROBAXIN) IV     PRN Meds:.acetaminophen, diphenhydrAMINE, HYDROcodone-acetaminophen, ipratropium-albuterol, magnesium citrate, methocarbamol **OR**  methocarbamol (ROBAXIN) IV, metoCLOPramide **OR** metoCLOPramide (REGLAN) injection, morphine injection, ondansetron **OR** ondansetron (ZOFRAN) IV, polyethylene glycol, polyvinyl alcohol, sorbitol    Anti-infectives (From admission, onward)   Start     Dose/Rate Route Frequency Ordered Stop   07/03/18 1030  elvitegravir-cobicistat-emtricitabine-tenofovir (GENVOYA) 150-150-200-10 MG tablet 1 tablet  Status:  Discontinued     1 tablet Oral Daily with breakfast 07/02/18 0918 07/02/18 1212   07/02/18 1300  bictegravir-emtricitabine-tenofovir AF (BIKTARVY) 50-200-25 MG per tablet 1 tablet     1 tablet Oral Daily 07/02/18 1213     07/02/18 0600  ceFAZolin (ANCEF) IVPB 2g/100 mL premix     2 g 200 mL/hr over 30 Minutes Intravenous On call to O.R. 07/01/18 1537 07/01/18 1631   07/01/18 2200  ceFAZolin (ANCEF) IVPB 2g/100 mL premix     2 g 200 mL/hr over 30 Minutes Intravenous Every 6 hours 07/01/18 1813 07/02/18 1030   07/01/18 1656  vancomycin (VANCOCIN) powder  Status:  Discontinued       As needed 07/01/18 1656 07/01/18 1721   07/01/18 1615  vancomycin (VANCOCIN) IVPB 1000 mg/200 mL premix     1,000 mg 200 mL/hr over 60 Minutes Intravenous  Once 07/01/18 1605 07/01/18 1715   07/01/18 1606  vancomycin (VANCOCIN) 1-5 GM/200ML-% IVPB    Note to Pharmacy: Henrine Screws   : cabinet override      07/01/18 1606 07/01/18 1615   07/01/18 1539  ceFAZolin (ANCEF) 2-4 GM/100ML-% IVPB    Note to Pharmacy: Therese Sarah   : cabinet override      07/01/18 1539 07/02/18 0344        Objective:   Vitals:   07/03/18 0538 07/03/18 0730 07/03/18 0820 07/03/18 1549  BP: (!) 143/60  (!) 147/63 (!) 106/51  Pulse: 67 70 64 67  Resp: 16 16 15 16   Temp: 98.7 F (37.1 C)  97.9 F (36.6 C) 98.2 F (36.8 C)  TempSrc: Oral  Oral Oral  SpO2: 95% 95% 97% 96%  Weight:      Height:        Wt Readings from Last 3 Encounters:  07/01/18 106.1 kg  06/01/18 106.1 kg  03/11/18 110.4 kg     Intake/Output  Summary (Last 24 hours) at 07/03/2018 1755 Last data filed at 07/03/2018 1300 Gross per 24 hour  Intake 1440 ml  Output 3050 ml  Net -1610 ml     Physical Exam  Gen:- Awake Alert,  In no apparent distress  HEENT:- [place].AT, No sclera icterus Neck-Supple Neck,No JVD,.  Lungs-  CTAB , fair symmetrical air movement CV- S1, S2 normal, regular  Abd-  +ve B.Sounds, Abd Soft, No tenderness,    Extremity/Skin:-   pedal pulses present, Rt LT Cast Psych-affect is appropriate, oriented x3 Neuro-no new focal deficits, no tremors   Data Review:   Micro Results Recent Results (from the past 240 hour(s))  SARS Coronavirus 2 (CEPHEID - Performed in  [facility] hospital lab), Hosp Order     Status: None   Collection Time: 07/01/18 12:26 PM   Specimen: Nasopharyngeal Swab  Result Value Ref Range Status   SARS Coronavirus 2 NEGATIVE NEGATIVE Final    Comment: (NOTE) If result is NEGATIVE SARS-CoV-2 target nucleic acids are NOT DETECTED. The SARS-CoV-2 RNA is generally detectable in upper and lower  respiratory specimens during the acute phase of infection. The lowest  concentration of SARS-CoV-2 viral copies this assay can detect is 250  copies / mL. A negative result does not preclude SARS-CoV-2 infection  and should not be used as the sole basis for treatment or other  patient management decisions.  A negative result may occur with  improper specimen collection / handling, submission of specimen other  than nasopharyngeal swab, presence of viral mutation(s) within the  areas targeted by this assay, and inadequate number of viral copies  (<250 copies / mL). A negative result must be combined with clinical  observations, patient history, and epidemiological information. If result is POSITIVE SARS-CoV-2 target nucleic acids are DETECTED. The SARS-CoV-2 RNA is generally detectable in upper and lower  respiratory specimens dur ing the acute phase of infection.  Positive  results are indicative of  active infection with SARS-CoV-2.  Clinical  correlation with patient history and other diagnostic information is  necessary to determine patient infection status.  Positive results do  not rule out bacterial infection or co-infection with other viruses. If result is PRESUMPTIVE POSTIVE SARS-CoV-2 nucleic acids MAY BE PRESENT.   A presumptive positive result was obtained on the submitted specimen  and confirmed on repeat testing.  While 2019 novel coronavirus  (SARS-CoV-2) nucleic acids may be present in the submitted sample  additional confirmatory testing may be necessary for epidemiological  and / or clinical management purposes  to differentiate between  SARS-CoV-2 and other Sarbecovirus currently known to infect humans.  If clinically indicated additional testing with an alternate test  methodology [id number]) is advised. The SARS-CoV-2 RNA is generally  detectable in upper and lower respiratory sp ecimens during the acute  phase of infection. The expected result is Negative. Fact Sheet for Patients:  StrictlyIdeas.no Fact Sheet for Healthcare Providers: BankingDealers.co.za This test is not yet approved or cleared by the [place] FDA and has been authorized for detection and/or diagnosis of SARS-CoV-2 by FDA under an Emergency Use Authorization (EUA).  This EUA will remain in effect (meaning this test can be used) for the duration of the COVID-19 declaration under Section 564(b)(1) of the Act, 21 U.S.C. section 360bbb-3(b)(1), unless the authorization is terminated or revoked sooner. Performed at [facility], [place] [address], [place], [place] [address]     Radiology Reports Dg Ankle Complete Right  Result Date: 07/01/2018 CLINICAL DATA:  53 year old female undergoing external fixation removal. EXAM: RIGHT ANKLE - COMPLETE 3+ VIEW; DG C-ARM 61-120 MIN COMPARISON:  06/16/2018. FLUOROSCOPY TIME:  0 minutes 6 seconds  FINDINGS: Three fluoroscopic spot views of the right ankle. External fixator hardware seen on 06/16/2018 has been removed. There remains a mildly displaced fracture of the distal right fibula. Mortise joint alignment otherwise appears preserved. IMPRESSION: External fixation removal from about the right ankle. Mildly displaced distal right fibula fracture. Electronically Signed   By: Genevie Ann M.D.   On: 07/01/2018 17:28   Dg Ankle Complete Right  Result Date: 06/16/2018 CLINICAL DATA:  Pt c/o right ankle fracture. Hx of ex-fix placement and previous right ankle fracture. EXAM: RIGHT  ANKLE - COMPLETE 3+ VIEW COMPARISON:  Plain film of the RIGHT ankle dated 06/01/2018. FINDINGS: External fixation hardware now in place, traversing the RIGHT ankle joint. Again noted are displaced fractures of the medial and lateral malleoli. Osseous alignment is significantly improved compared to the earlier plain film examination. Ankle mortise is now symmetric. IMPRESSION: External fixation hardware now in place traversing the RIGHT ankle joint. Osseous alignment is significantly improved compared to the earlier plain film examination. Electronically Signed   By: Franki Cabot M.D.   On: 06/16/2018 13:30   Dg Chest Port 1 View  Result Date: 06/15/2018 CLINICAL DATA:  Cough. EXAM: PORTABLE CHEST 1 VIEW COMPARISON:  06/02/2018 FINDINGS: Stable cardiomegaly. Left jugular central venous catheter remains in appropriate position. Both lungs are clear. Several old bilateral rib fracture deformities again noted. IMPRESSION: Stable cardiomegaly. No active lung disease. Electronically Signed   By: Earle Gell M.D.   On: 06/15/2018 10:07   Dg Ankle Right Port  Result Date: 06/09/2018 CLINICAL DATA:  Trimalleolar ankle fracture and external fixation. EXAM: PORTABLE RIGHT ANKLE - 2 VIEW COMPARISON:  CT scan 06/03/2018 FINDINGS: Stable appearing position and alignment of the trimalleolar ankle fractures. Stable mild widening of the  lateral ankle mortise. IMPRESSION: Stable position alignment of the trimalleolar ankle fractures. Electronically Signed   By: Marijo Sanes M.D.   On: 06/09/2018 13:56   Dg C-arm 1-60 Min  Result Date: 07/01/2018 CLINICAL DATA:  53 year old female undergoing external fixation removal. EXAM: RIGHT ANKLE - COMPLETE 3+ VIEW; DG C-ARM 61-120 MIN COMPARISON:  06/16/2018. FLUOROSCOPY TIME:  0 minutes 6 seconds FINDINGS: Three fluoroscopic spot views of the right ankle. External fixator hardware seen on 06/16/2018 has been removed. There remains a mildly displaced fracture of the distal right fibula. Mortise joint alignment otherwise appears preserved. IMPRESSION: External fixation removal from about the right ankle. Mildly displaced distal right fibula fracture. Electronically Signed   By: Genevie Ann M.D.   On: 07/01/2018 17:28   Xr Ankle Complete Right  Result Date: 07/01/2018 Loosening of the transfixing calcaneal pin.  The bimalleolar ankle fracture alignment appears to be stable.    CBC Recent Labs  Lab 07/01/18 1900 07/02/18 0949 07/03/18 0727  WBC 12.5* 11.4* 10.2  HGB 11.2* 10.5* 10.7*  HCT 37.2 34.2* 35.3*  PLT 348 377 385  MCV 87.9 86.4 87.2  MCH 26.5 26.5 26.4  MCHC 30.1 30.7 30.3  RDW 18.4* 18.0* 18.0*  LYMPHSABS  --   --  2.0  MONOABS  --   --  0.8  EOSABS  --   --  0.1  BASOSABS  --   --  0.1    Chemistries  Recent Labs  Lab 07/01/18 1900 07/02/18 0949 07/03/18 0727  NA  --  130* 135  K  --  5.0 4.0  CL  --  94* 100  CO2  --  21* 26  GLUCOSE  --  397* 333*  BUN  --  26* 32*  CREATININE 1.01* 1.43* 1.18*  CALCIUM  --  8.6* 8.5*   ------------------------------------------------------------------------------------------------------------------ No results for input(s): CHOL, HDL, LDLCALC, TRIG, CHOLHDL, LDLDIRECT in the last 72 hours.  Lab Results  Component Value Date   HGBA1C 8.3 (H) 07/02/2018    ------------------------------------------------------------------------------------------------------------------ No results for input(s): TSH, T4TOTAL, T3FREE, THYROIDAB in the last 72 hours.  Invalid input(s): FREET3 ------------------------------------------------------------------------------------------------------------------ Recent Labs    07/03/18 0727  FERRITIN 68  TIBC 374  IRON 20*    Coagulation profile No results  for input(s): INR, PROTIME in the last 168 hours.  No results for input(s): DDIMER in the last 72 hours.  Cardiac Enzymes No results for input(s): CKMB, TROPONINI, MYOGLOBIN in the last 168 hours.  Invalid input(s): CK ------------------------------------------------------------------------------------------------------------------    Component Value Date/Time   BNP 29.8 01/05/2018 0147     Darreon Lutes M.D on 07/03/2018 at 5:55 PM  Go to [email or web address] - for contact info  [facility] - Office  [phone]

## 2018-07-04 LAB — GLUCOSE, CAPILLARY
Glucose-Capillary: 258 mg/dL — ABNORMAL HIGH (ref 70–99)
Glucose-Capillary: 189 mg/dL — ABNORMAL HIGH (ref 70–99)

## 2018-07-04 MED ORDER — HYDROCODONE-ACETAMINOPHEN 10-325 MG PO TABS: 1.0000 | ORAL_TABLET | ORAL | 0 refills | Status: DC | PRN

## 2018-07-04 MED ORDER — ACETAMINOPHEN 325 MG PO TABS: 325.0000 mg | ORAL_TABLET | Freq: Four times a day (QID) | ORAL | 2 refills | Status: DC | PRN

## 2018-07-04 MED ORDER — ENOXAPARIN SODIUM 40 MG/0.4ML ~~LOC~~ SOLN: 40.0000 mg | Freq: Every day | SUBCUTANEOUS | 0 refills | Status: DC

## 2018-07-04 MED ORDER — METHOCARBAMOL 500 MG PO TABS: 500.0000 mg | ORAL_TABLET | Freq: Three times a day (TID) | ORAL | 1 refills | Status: DC

## 2018-07-04 MED ORDER — SENNOSIDES-DOCUSATE SODIUM 8.6-50 MG PO TABS: 1.0000 | ORAL_TABLET | Freq: Two times a day (BID) | ORAL | 0 refills | Status: DC

## 2018-07-04 MED ORDER — POLYETHYLENE GLYCOL 3350 17 G PO PACK: 17.0000 g | PACK | Freq: Every day | ORAL | 0 refills | Status: DC

## 2018-07-04 MED ORDER — FERROUS SULFATE 325 (65 FE) MG PO TBEC: 325.0000 mg | DELAYED_RELEASE_TABLET | Freq: Every day | ORAL | 3 refills | Status: DC

## 2018-07-04 MED ORDER — BICTEGRAVIR-EMTRICITAB-TENOFOV 50-200-25 MG PO TABS: 1.0000 | ORAL_TABLET | Freq: Every day | ORAL | 2 refills | Status: DC

## 2018-07-04 MED ORDER — INSULIN GLARGINE 100 UNIT/ML ~~LOC~~ SOLN: 25.0000 [IU] | Freq: Two times a day (BID) | SUBCUTANEOUS | 11 refills | Status: DC

## 2018-07-04 NOTE — Progress Notes (Signed)
Physical Therapy Treatment Patient Details Name: [name] MRN: [id number] DOB: Jul 08, 1965 Today's Date: 07/04/2018    History of Present Illness Patient is a 53 y/o female s/p RIGHT ANKLE EX FIX REMOVAL, IRRIGATION AND DEBRIDEMENT AND SHORT LEG CAST on 07/01/2018. Past medical history significant of severe COPD on home O2, HVOS, CKD, HIV, DM2 s/p L BKA.     PT Comments    Pt resting supine on arrival.  Refused OOB as she reports he prosthetic limb is not present.  Pt agreeable to sit edge of bed to perform LE strengthening and reaching activities.  Pt performed lateral scoot along edge of bed to move toward [facility] with supervision.  Pt continues to benefit from rehab at snf to improve strength and function before returning home.    Follow Up Recommendations  SNF     Equipment Recommendations  Other (comment)    Recommendations for Other Services Rehab consult     Precautions / Restrictions Precautions Precautions: Fall Required Braces or Orthoses: Splint/Cast Splint/Cast: R ankle Restrictions Weight Bearing Restrictions: Yes RLE Weight Bearing: Non weight bearing    Mobility  Bed Mobility Overal bed mobility: Needs Assistance Bed Mobility: Supine to Sit;Sit to Supine     Supine to sit: Min assist Sit to supine: Supervision   General bed mobility comments: Min assistance to move to edge of bed and supervision to return.  Patient sat edge of bed to perform LE exercises, reaching activities and scooting toward [facility]  Transfers     Transfers: Lateral/Scoot Transfers(along edge of bed with supervision and increased time.)              Ambulation/Gait                 Stairs             Wheelchair Mobility    Modified Rankin (Stroke Patients Only)       Balance Overall balance assessment: Needs assistance   Sitting balance-Leahy Scale: Good Sitting balance - Comments: Intermittent unilateral support.  Pt performed reaching outside BOS edge  of bed with B UEs to various heights.                                    Cognition Arousal/Alertness: Awake/alert Behavior During Therapy: WFL for tasks assessed/performed Overall Cognitive Status: Within Functional Limits for tasks assessed                                        Exercises General Exercises - Lower Extremity Long Arc Quad: AROM;Both;10 reps;Seated Hip Flexion/Marching: AROM;Both;10 reps;Seated    General Comments        Pertinent Vitals/Pain Pain Assessment: Faces Faces Pain Scale: Hurts little more Pain Location: head, R ankle Pain Descriptors / Indicators: Aching;Discomfort Pain Intervention(s): Monitored during session;Repositioned    Home Living                      Prior Function            PT Goals (current goals can now be found in the care plan section) Acute Rehab PT Goals Patient Stated Goal: reduce pain Potential to Achieve Goals: Good Progress towards PT goals: Progressing toward goals    Frequency    Min 3X/week      PT  Plan Current plan remains appropriate    Co-evaluation              AM-PAC PT "6 Clicks" Mobility   Outcome Measure  Help needed turning from your back to your side while in a flat bed without using bedrails?: A Little Help needed moving from lying on your back to sitting on the side of a flat bed without using bedrails?: A Little Help needed moving to and from a bed to a chair (including a wheelchair)?: Total Help needed standing up from a chair using your arms (e.g., wheelchair or bedside chair)?: Total Help needed to walk in hospital room?: Total Help needed climbing 3-5 steps with a railing? : Total 6 Click Score: 10    End of Session Equipment Utilized During Treatment: Gait belt Activity Tolerance: Patient tolerated treatment well Patient left: in bed;with call bell/phone within reach Nurse Communication: Mobility status PT Visit Diagnosis: Unsteadiness  on feet (R26.81);Other abnormalities of gait and mobility (R26.89);History of falling (Z91.81);Pain Pain - Right/Left: Right Pain - part of body: Ankle and joints of foot     Time: 1351-1405 PT Time Calculation (min) (ACUTE ONLY): 14 min  Charges:  $Therapeutic Activity: 8-22 mins                     Governor Rooks, PTA Acute Rehabilitation Services Pager [phone] Office [phone]     Eulas Schweitzer Eli Hose 07/04/2018, 3:21 PM

## 2018-07-04 NOTE — Discharge Summary (Addendum)
[name], is a 53 y.o. female  DOB [birth year]  MRN [id number].  Admission date:  07/01/2018  Admitting Physician  Norval Morton, MD  Discharge Date:  07/04/2018   Primary MD  [name], PA-C  Recommendations for primary care physician for things to follow:   --1)Up with therapy -- --2)NWB RLE --3)ELEVATE OPERATIVE EXTREMITY AT ALL TIMES --4)F/u with Dr. Erlinda Hong 2 weeks post-op for wound check --5)Avoid ibuprofen/Advil/Aleve/Motrin/Goody Powders/Naproxen/BC powders/Meloxicam/Diclofenac/Indomethacin and other Nonsteroidal anti-inflammatory medications as these will make you more likely to bleed and can cause stomach ulcers, can also cause Kidney problems.  --6)Lovenox/Enoxaparin 40 mg daily subcutaneous for 2 weeks  --7)Repeat CBC and BMP Blood test in 1 week --8) please check blood sugars before each meal (3x a day) and at bedtime --9) please use oxygen as prescribed    Admission Diagnosis  right ankle fracture, wound   Discharge Diagnosis  right ankle fracture, wound   Principal Problem:   Displaced bimalleolar fracture of right ankle, closed, initial encounter Active Problems:   Human immunodeficiency virus (HIV) disease ([place])   Diabetes mellitus type 2 with complications, uncontrolled ([place])   COPD (chronic obstructive pulmonary disease) ([place])   Acute kidney injury superimposed on CKD ([place])   Closed right ankle fracture   Anemia of chronic disease      Past Medical History:  Diagnosis Date   Acute metabolic encephalopathy 0/93/2355   Acute on chronic respiratory failure with hypoxia ([place]) 05/19/2016   Anxiety    ARF (acute renal failure) ([place]) 05/03/2014   Arthritis    LEFT HIP   Asthma    HOSPITALIZED WITH EXCERBATION OF ASTHMA - AND BRONCHITIS AND THE FLU DEC 2013   Asthma exacerbation attacks 12/28/2011   Bronchitis 12/28/2011   CAP (community acquired pneumonia)  07/25/2012   Chronic kidney disease (CKD), stage IV (severe) ([place]) 11/10/2014   Class 3 obesity due to excess calories with serious comorbidity and body mass index (BMI) of 40.0 to 44.9 in adult    Colles' fracture of left radius    COPD (chronic obstructive pulmonary disease) ([place])    Depression    Diabetes mellitus    ON INSULIN AND ORAL MEDICATIONS   Diabetes mellitus type 2 with complications, uncontrolled ([place]) 04/19/2008   Qualifier: Diagnosis of  By: Tomma Lightning MD, Kelly      Diabetic foot infection ([place]) 11/09/2014   Diabetic ketoacidosis without coma associated with type 2 diabetes mellitus ([place])    Diabetic neuropathy ([place]) 11/10/2014   Diarrhea 08/30/2008   Qualifier: Diagnosis of  By: Tomma Lightning MD, Kelly     Diastolic dysfunction 73/02/2023   Difficult intravenous access    DKA, type 2 ([place]) 04/04/2016   Dyslipidemia 12/23/2008   Qualifier: Diagnosis of  By: Tomma Lightning MD, Claiborne Billings     Essential hypertension 04/19/2008   Qualifier: Diagnosis of  By: Tomma Lightning MD, Claiborne Billings     FATIGUE 07/12/2008   Qualifier: Diagnosis of  By: Tomma Lightning MD, Kelly     Femoral neck fracture ([place]) 07/16/2011  Femur fracture, left ([employer]) 07/08/2014   Fever    Fracture of distal femur ([place]) 07/08/2014   GERD (gastroesophageal reflux disease)    Hereditary and idiopathic peripheral neuropathy 04/19/2008   Qualifier: Diagnosis of  By: Tomma Lightning MD, Kelly     Hip fracture, left ([place]) 07/16/2011   Hip pain    HIV infection ([place]) 2000   Human immunodeficiency virus (HIV) disease ([place]) 04/19/2008   [id number] +    Hyperlipidemia    Hypertension    Hyponatremia 12/29/2011   Influenza A 12/29/2011   Influenza B    Insomnia 06/12/2016   Left hip pain 05/03/2014   Mood disorder ([place]) 04/19/2008   Qualifier: Diagnosis of  By: Tomma Lightning MD, Kelly     Neuropathy    NEUROPATHY HANDS AND FEET   NSTEMI (non-ST elevated myocardial infarction) ([place]) 05/19/2016   Obesity hypoventilation syndrome ([place])     Osteoporosis 07/08/2014   Pain    SEVERE PAIN LEFT HIP - HX OF LEFT HIP PINNING JULY 2013   Perimenopausal symptoms 02/13/2012   LMP around 2011. On estrace and provera since around 2012 for hot flashes, mood swings. Estrace 2 mg daily, Provera 2.5 mg for 5 days each month.    Repeated falls 07/30/2011   Sepsis ([place]) 04/04/2016   Shortness of breath    ALLERGIES ARE "ACTING UP"   SOB (shortness of breath)    THRUSH 05/20/2008   Qualifier: Diagnosis of  By: Tomma Lightning MD, Claiborne Billings       Tobacco use disorder 04/14/2010    Past Surgical History:  Procedure Laterality Date   AMPUTATION Left 11/18/2014   Procedure: LEFT BELOW KNEE AMPUTATION;  Surgeon: Leandrew Koyanagi, MD;  Location: [facility];  Service: Orthopedics;  Laterality: Left;   CAST APPLICATION Left 02/05/971   Procedure: CAST APPLICATION (FIBERGLASS);  Surgeon: Latanya Maudlin, MD;  Location[facility];  Service: Orthopedics;  Laterality: Left;  CLOSED REDUCTION OF LEFT COLLES FRACTURE WITH SHORT ARM CAST   EXTERNAL FIXATION LEG Right 06/02/2018   Procedure: EXTERNAL FIXATION LEG;  Surgeon: Leandrew Koyanagi, MD;  Location: [facility];  Service: Orthopedics;  Laterality: Right;   EXTERNAL FIXATION REMOVAL Right 07/01/2018   Procedure: REMOVAL EXTERNAL FIXATION LEG;  Surgeon: Leandrew Koyanagi, MD;  Location: [facility];  Service: Orthopedics;  Laterality: Right;   HARDWARE REMOVAL Left 05/05/2014   Procedure: HARDWARE REMOVAL LEFT HIP;  Surgeon: Latanya Maudlin, MD;  [facility];  Service: Orthopedics;  Laterality: Left;  REMOVAL BIOMET 6.5-8.0 CANNULATED SCREW   HIP ARTHROPLASTY Left 05/05/2014   Procedure: ARTHROPLASTY OPEN REDUCTION INTERNAL FIXATION LEFT HIP AND REMOVAL OF TWO CANNULATED SCREW;  Surgeon: Latanya Maudlin, MD;  [facility];  Service: Orthopedics;  Laterality: Left;   HIP PINNING,CANNULATED  07/16/2011   Procedure: CANNULATED HIP PINNING;  Surgeon: Gearlean Alf, MD;  [facility];  Service: Orthopedics;  Laterality: Left;   HIP  PINNING,CANNULATED Left 04/09/2012   Procedure: CANNULATED HIP PINNING AND HARDWARE REVISION;  Surgeon: Gearlean Alf, MD;  [facility];  Service: Orthopedics;  Laterality: Left;   I&D EXTREMITY Left 11/16/2014   Procedure:  DEBRIDEMENT OF LEFT FOOT POSSIBLE APPLICATION OF INTEGRIA AND VAC ;  Surgeon: Irene Limbo, MD;  Location: [facility];  Service: Plastics;  Laterality: Left;   I&D EXTREMITY Right 07/01/2018   Procedure: RIGHT ANKLE EX FIX REMOVAL, IRRIGATION AND DEBRIDEMENT AND SHORT LEG CAST;  Surgeon: Leandrew Koyanagi, MD;  Location: [facility];  Service: Orthopedics;  Laterality: Right;   PERIPHERAL VASCULAR CATHETERIZATION N/A 11/15/2014   Procedure: Abdominal Aortogram;  Surgeon: Conrad Lynnville, MD;  Location: [facility];  Service: Cardiovascular;  Laterality: N/A;   PERIPHERAL VASCULAR CATHETERIZATION  11/15/2014   Procedure: Lower Extremity Angiography;  Surgeon: Conrad Masontown, MD;  Location: [facility];  Service: Cardiovascular;;   PERIPHERAL VASCULAR CATHETERIZATION Left 11/15/2014   Procedure: Peripheral Vascular Intervention;  Surgeon: Conrad Harrison, MD;  Location: [facility];  Service: Cardiovascular;  Laterality: Left;  popliteal artery stenting      HPI  from the history and physical done on the day of admission:    HPI: MAGARET JUSTO is a 53 y.o. female with medical history significant of COPD on home O2 3 L at night, CKD, HLD, DM type II, s/p left BKA, who presented  1 month after sustaining a right bimalleolar ankle fracture after fall for follow-up Dr Erlinda Hong office yesterday.  Patient reports that she has been staying at [facility].  She complained of having severe pain in the heel of her right ankle since having the external fixators placed during hospitalization on 6/1.  Dr. Erlinda Hong found the patient to have loosening of hardware and soft tissue compromise. [facility] was called to admit the patient as a direct admission by orthopedics yesterday.  Patient was  taken to surgery yesterday to remove the external fixator, irrigate and debride  the right lower extremity, and place leg in a short cast.  There was some miscommunication for which orthopedics had   admitted thepatient yesterday, but we will take over care today.  Patient reports that she had been taken off of Invokana due to the history of amputations and was placed on Jardiance with significant improvement in blood sugars.  Patient also complains of blurry vision and itching eyes. She has been seen previously by optometrist and ophthalmologist.  At some point reports being placed on eyedrops which helped for some time, but eventually stopped helping.    Hospital Course:   Brief Summary 53 y.o.femalewith medical history significant ofCOPD on home O2 3 L at night, CKD, HLD, DM type II, s/pleft BKA, who presented 1 month after sustaining a right bimalleolar ankle fracture after fall for follow-up Dr Valerie Salts yesterday. Patient reports that she has been staying at [facility]. She complained of having severe pain in the heel of her right ankle since having the external fixators placed during hospitalization on 6/1.Dr. Campbell Riches the patient to have loosening of hardware and soft tissue compromise.  A/p 1)bimalleolar fracture of right ankle--external fixators were placed 06/02/2018, went back to the OR 07/01/2018 for removal of fixator due to loosening of the hardware and soft tissue compromise--- further management per orthopedic team-- NWB on Rt LE, keep right lower extremity elevated.  Follow-up with Dr. Erlinda Hong from orthopedic surgery in 2 weeks  2)DM2--A1c 8.3 this admission,  continue current insulin regimen , use Accu-Cheks/Fingersticks as ordered   3)AKI--due to dehydration, resolved, creatinine is down to 1.1 from 1.4, avoid nephrotoxic agents  3)Acute blood loss anemia--hemoglobin is down to 10.7, work-up suggestive of iron deficiency anemia--  --Repeat CBC in 1 week  4)Chronic  hypoxic respiratory failure secondary to underlying COPD--- patient uses oxygen and trilogy machine, continue 2 L of oxygen nightly,   as well as bronchodilators  5)H/o HIV--switched from Genvoya to Ramirez-Perez  6) chronic pain syndrome-- continue hydrocodone and Lyrica   Code Status : Full  Family Communication:   NA (  patient is alert, awake and coherent)  Disposition Plan  : SNF  PROCEDURE: 07/01/2018 1. Removal of right ankle external fixator under general anesthesia 2. Irrigation and debridement of right lower extremity including bone, skin, subcutaneous tissue 3. Application of right short leg cast  Consults  :  ortho   Discharge Condition: stable  Follow UP  Follow-up Information    Leandrew Koyanagi, MD In 2 weeks.   Specialty: Orthopedic Surgery Why: For wound re-check, For suture removal Contact information: [place] [address] [phone]           Diet and Activity recommendation:  As advised  Discharge Instructions    Discharge Instructions    Call MD for:  persistant dizziness or light-headedness   Complete by: As directed    Call MD for:  persistant nausea and vomiting   Complete by: As directed    Call MD for:  redness, tenderness, or signs of infection (pain, swelling, redness, odor or green/yellow discharge around incision site)   Complete by: As directed    Call MD for:  severe uncontrolled pain   Complete by: As directed    Call MD for:  temperature >100.4   Complete by: As directed    Diet - low sodium heart healthy   Complete by: As directed    Discharge instructions   Complete by: As directed    --1)Up with therapy -- --2)NWB RLE --3)ELEVATE OPERATIVE EXTREMITY AT ALL TIMES --4)F/u with Dr. Erlinda Hong 2 weeks post-op for wound check --5)Avoid ibuprofen/Advil/Aleve/Motrin/Goody Powders/Naproxen/BC powders/Meloxicam/Diclofenac/Indomethacin and other Nonsteroidal anti-inflammatory medications as these will make  you more likely to bleed and can cause stomach ulcers, can also cause Kidney problems.  --6)Lovenox/Enoxaparin 40 mg daily subcutaneous for 2 weeks  --7)Repeat CBC and BMP Blood test in 1 week --8) please check blood sugars before each meal (3x a day) and at bedtime --9) please use oxygen as prescribed   Increase activity slowly   Complete by: As directed    NWB on RL        Discharge Medications     Allergies as of 07/04/2018      Reactions   Cortizone-10 [hydrocortisone] Rash   Per patient, given local injection at knee and developed rash at local site.       Medication List    STOP taking these medications   fluticasone furoate-vilanterol 100-25 MCG/INH Aepb Commonly known as: Breo Ellipta   Genvoya 150-150-200-10 MG Tabs tablet Generic drug: elvitegravir-cobicistat-emtricitabine-tenofovir   Toujeo Max SoloStar 300 UNIT/ML Sopn Generic drug: Insulin Glargine (2 Unit Dial) Replaced by: insulin glargine 100 UNIT/ML injection     TAKE these medications   acetaminophen 325 MG tablet Commonly known as: TYLENOL Take 1-2 tablets (325-650 mg total) by mouth every 6 (six) hours as needed for mild pain (pain score 1-3 or temp > 100.5). Notes to patient: Last dose on 7/1   albuterol 108 (90 Base) MCG/ACT inhaler Commonly known as: VENTOLIN HFA INHALE 2 PUFFS INTO THE LUNGS EVERY 6 HOURS AS NEEDED FOR WHEEZING OR SHORTNESS OF BREATH What changed:   how much to take  how to take this  when to take this  reasons to take this  additional instructions   bictegravir-emtricitabine-tenofovir AF 50-200-25 MG Tabs tablet Commonly known as: BIKTARVY Take 1 tablet by mouth daily. Start taking on: July 05, 2018   empagliflozin 10 MG Tabs tablet Commonly known as: JARDIANCE Take 10 mg by mouth daily.   enoxaparin 40  MG/0.4ML injection Commonly known as: LOVENOX Inject 0.4 mLs (40 mg total) into the skin daily for 14 days.   ferrous sulfate 325 (65 FE) MG EC tablet Take 1  tablet (325 mg total) by mouth daily with breakfast.   fluticasone 50 MCG/ACT nasal spray Commonly known as: FLONASE Place 1 spray into both nostrils daily.   Fluticasone-Umeclidin-Vilant 100-62.5-25 MCG/INH Aepb Commonly known as: Trelegy Ellipta Inhale 1 puff into the lungs daily.   glucose blood test strip Commonly known as: Accu-Chek Aviva Use as instructed to check blood sugar 2 times daily.   HumuLIN R U-500 KwikPen 500 UNIT/ML kwikpen Generic drug: insulin regular human CONCENTRATED Inject 10 Units into the skin 3 (three) times daily with meals. What changed:   how much to take  when to take this  additional instructions Notes to patient: Novolog SSC ACHS and an additional 8 units with meals    HYDROcodone-acetaminophen 10-325 MG tablet Commonly known as: NORCO Take 1 tablet by mouth every 4 (four) hours as needed for moderate pain. Notes to patient: Given at discharge prior to transport   insulin glargine 100 UNIT/ML injection Commonly known as: LANTUS Inject 0.25 mLs (25 Units total) into the skin 2 (two) times daily. Replaces: Toujeo Max SoloStar 300 UNIT/ML Sopn   ipratropium-albuterol 0.5-2.5 (3) MG/3ML Soln Commonly known as: DUONEB Take 3 mLs by nebulization every 6 (six) hours.   levocetirizine 5 MG tablet Commonly known as: Xyzal Take 1 tablet daily for allergies. What changed:   how much to take  how to take this  when to take this  additional instructions   magnesium oxide 400 MG tablet Commonly known as: MAG-OX Take 400 mg by mouth daily.   methocarbamol 500 MG tablet Commonly known as: ROBAXIN Take 1 tablet (500 mg total) by mouth 3 (three) times daily. Notes to patient: Given at 822   montelukast 10 MG tablet Commonly known as: SINGULAIR Take 1 tablet (10 mg total) by mouth at bedtime.   pantoprazole 40 MG tablet Commonly known as: PROTONIX Take 1 tablet (40 mg total) by mouth daily.   polyethylene glycol 17 g packet Commonly  known as: MIRALAX / GLYCOLAX Take 17 g by mouth daily. What changed:   when to take this  reasons to take this   pregabalin 225 MG capsule Commonly known as: LYRICA Take 225 mg by mouth 2 (two) times daily.   rosuvastatin 20 MG tablet Commonly known as: CRESTOR Take 1 tablet (20 mg total) by mouth daily. Reported on 05/02/2015   senna-docusate 8.6-50 MG tablet Commonly known as: Senokot-S Take 1 tablet by mouth 2 (two) times daily. Notes to patient: Not given   sertraline 100 MG tablet Commonly known as: ZOLOFT Take 100 mg by mouth daily. (Give with 71m tablet for a total of 1575m   sertraline 50 MG tablet Commonly known as: ZOLOFT Take 50 mg by mouth daily. (Give with 10065mablet for a total of 150m11m          Durable Medical Equipment  (From admission, onward)         Start     Ordered   07/01/18 1814  DME Walker rolling  Once    Question:  Patient needs a walker to treat with the following condition  Answer:  History of open reduction and internal fixation (ORIF) procedure   07/01/18 1813   07/01/18 1814  DME 3 n 1  Once     07/01/18 1813  07/01/18 1814  DME Bedside commode  Once    Question:  Patient needs a bedside commode to treat with the following condition  Answer:  History of open reduction and internal fixation (ORIF) procedure   07/01/18 1813          Major procedures and Radiology Reports - PLEASE review detailed and final reports for all details, in brief -     Dg Ankle Complete Right  Result Date: 07/01/2018 CLINICAL DATA:  53 year old female undergoing external fixation removal. EXAM: RIGHT ANKLE - COMPLETE 3+ VIEW; DG C-ARM 61-120 MIN COMPARISON:  06/16/2018. FLUOROSCOPY TIME:  0 minutes 6 seconds FINDINGS: Three fluoroscopic spot views of the right ankle. External fixator hardware seen on 06/16/2018 has been removed. There remains a mildly displaced fracture of the distal right fibula. Mortise joint alignment otherwise appears  preserved. IMPRESSION: External fixation removal from about the right ankle. Mildly displaced distal right fibula fracture. Electronically Signed   By: Genevie Ann M.D.   On: 07/01/2018 17:28   Dg Ankle Complete Right  Result Date: 06/16/2018 CLINICAL DATA:  Pt c/o right ankle fracture. Hx of ex-fix placement and previous right ankle fracture. EXAM: RIGHT ANKLE - COMPLETE 3+ VIEW COMPARISON:  Plain film of the RIGHT ankle dated 06/01/2018. FINDINGS: External fixation hardware now in place, traversing the RIGHT ankle joint. Again noted are displaced fractures of the medial and lateral malleoli. Osseous alignment is significantly improved compared to the earlier plain film examination. Ankle mortise is now symmetric. IMPRESSION: External fixation hardware now in place traversing the RIGHT ankle joint. Osseous alignment is significantly improved compared to the earlier plain film examination. Electronically Signed   By: Franki Cabot M.D.   On: 06/16/2018 13:30   Dg Chest Port 1 View  Result Date: 06/15/2018 CLINICAL DATA:  Cough. EXAM: PORTABLE CHEST 1 VIEW COMPARISON:  06/02/2018 FINDINGS: Stable cardiomegaly. Left jugular central venous catheter remains in appropriate position. Both lungs are clear. Several old bilateral rib fracture deformities again noted. IMPRESSION: Stable cardiomegaly. No active lung disease. Electronically Signed   By: Earle Gell M.D.   On: 06/15/2018 10:07   Dg Ankle Right Port  Result Date: 06/09/2018 CLINICAL DATA:  Trimalleolar ankle fracture and external fixation. EXAM: PORTABLE RIGHT ANKLE - 2 VIEW COMPARISON:  CT scan 06/03/2018 FINDINGS: Stable appearing position and alignment of the trimalleolar ankle fractures. Stable mild widening of the lateral ankle mortise. IMPRESSION: Stable position alignment of the trimalleolar ankle fractures. Electronically Signed   By: Marijo Sanes M.D.   On: 06/09/2018 13:56   Dg C-arm 1-60 Min  Result Date: 07/01/2018 CLINICAL DATA:   53 year old female undergoing external fixation removal. EXAM: RIGHT ANKLE - COMPLETE 3+ VIEW; DG C-ARM 61-120 MIN COMPARISON:  06/16/2018. FLUOROSCOPY TIME:  0 minutes 6 seconds FINDINGS: Three fluoroscopic spot views of the right ankle. External fixator hardware seen on 06/16/2018 has been removed. There remains a mildly displaced fracture of the distal right fibula. Mortise joint alignment otherwise appears preserved. IMPRESSION: External fixation removal from about the right ankle. Mildly displaced distal right fibula fracture. Electronically Signed   By: Genevie Ann M.D.   On: 07/01/2018 17:28   Xr Ankle Complete Right  Result Date: 07/01/2018 Loosening of the transfixing calcaneal pin.  The bimalleolar ankle fracture alignment appears to be stable.   Micro Results    Recent Results (from the past 240 hour(s))  SARS Coronavirus 2 (CEPHEID - Performed in [facility] hospital lab), [facility]  Status: None   Collection Time: 07/01/18 12:26 PM   Specimen: Nasopharyngeal Swab  Result Value Ref Range Status   SARS Coronavirus 2 NEGATIVE NEGATIVE Final    Comment: (NOTE) If result is NEGATIVE SARS-CoV-2 target nucleic acids are NOT DETECTED. The SARS-CoV-2 RNA is generally detectable in upper and lower  respiratory specimens during the acute phase of infection. The lowest  concentration of SARS-CoV-2 viral copies this assay can detect is 250  copies / mL. A negative result does not preclude SARS-CoV-2 infection  and should not be used as the sole basis for treatment or other  patient management decisions.  A negative result may occur with  improper specimen collection / handling, submission of specimen other  than nasopharyngeal swab, presence of viral mutation(s) within the  areas targeted by this assay, and inadequate number of viral copies  (<250 copies / mL). A negative result must be combined with clinical  observations, patient history, and epidemiological information. If result is  POSITIVE SARS-CoV-2 target nucleic acids are DETECTED. The SARS-CoV-2 RNA is generally detectable in upper and lower  respiratory specimens dur ing the acute phase of infection.  Positive  results are indicative of active infection with SARS-CoV-2.  Clinical  correlation with patient history and other diagnostic information is  necessary to determine patient infection status.  Positive results do  not rule out bacterial infection or co-infection with other viruses. If result is PRESUMPTIVE POSTIVE SARS-CoV-2 nucleic acids MAY BE PRESENT.   A presumptive positive result was obtained on the submitted specimen  and confirmed on repeat testing.  While 2019 novel coronavirus  (SARS-CoV-2) nucleic acids may be present in the submitted sample  additional confirmatory testing may be necessary for epidemiological  and / or clinical management purposes  to differentiate between  SARS-CoV-2 and other Sarbecovirus currently known to infect humans.  If clinically indicated additional testing with an alternate test  methodology [phone]) is advised. The SARS-CoV-2 RNA is generally  detectable in upper and lower respiratory sp ecimens during the acute  phase of infection. The expected result is Negative. Fact Sheet for Patients:  StrictlyIdeas.no Fact Sheet for Healthcare Providers: BankingDealers.co.za This test is not yet approved or cleared by the [place] FDA and has been authorized for detection and/or diagnosis of SARS-CoV-2 by FDA under an Emergency Use Authorization (EUA).  This EUA will remain in effect (meaning this test can be used) for the duration of the COVID-19 declaration under Section 564(b)(1) of the Act, 21 U.S.C. section 360bbb-3(b)(1), unless the authorization is terminated or revoked sooner. Performed at [facility], [place] [address], [place], [place] [address]        Today   Subjective    Mayia Megill  today has no new complaints, eating and drinking okay,          Patient has been seen and examined prior to discharge   Objective   Blood pressure (!) 148/62, pulse 66, temperature 98.1 F (36.7 C), temperature source Oral, resp. rate 16, height 5' 10"  (1.778 m), weight 106.1 kg, last menstrual period 11/16/2010, SpO2 97 %.   Intake/Output Summary (Last 24 hours) at 07/04/2018 1318 Last data filed at 07/04/2018 0912 Gross per 24 hour  Intake 480 ml  Output 2150 ml  Net -1670 ml    Exam Gen:- Awake Alert, no acute distress  HEENT:- [place].AT, No sclera icterus Neck-Supple Neck,No JVD,.  Lungs-  CTAB , good air movement bilaterally  CV- S1, S2 normal, regular Abd-  +  ve B.Sounds, Abd Soft, No tenderness,    Extremity/Skin:-   good pulses on the right, right lower extremity cast noted, capillary refill is good, left BKA noted Psych-affect is appropriate, oriented x3 Neuro-generalized weakness, but no new focal deficits, no tremors    Data Review   CBC w Diff:  Lab Results  Component Value Date   WBC 10.2 07/03/2018   HGB 10.7 (L) 07/03/2018   HCT 35.3 (L) 07/03/2018   PLT 385 07/03/2018   LYMPHOPCT 20 07/03/2018   MONOPCT 8 07/03/2018   EOSPCT 1 07/03/2018   BASOPCT 1 07/03/2018    CMP:  Lab Results  Component Value Date   NA 135 07/03/2018   NA 144 09/12/2017   K 4.0 07/03/2018   CL 100 07/03/2018   CO2 26 07/03/2018   BUN 32 (H) 07/03/2018   BUN 21 09/12/2017   CREATININE 1.18 (H) 07/03/2018   CREATININE 1.42 (H) 05/01/2018   GLU 156 09/12/2017   PROT 5.2 (L) 06/15/2018   ALBUMIN 2.0 (L) 06/15/2018   BILITOT 0.6 06/15/2018   ALKPHOS 67 06/15/2018   AST 15 06/15/2018   ALT 18 06/15/2018  .   Total Discharge time is about 33 minutes  Roxan Hockey M.D on 07/04/2018 at 1:18 PM  Go to [email or web address] -  for contact info  [facility] - Office  [phone]

## 2018-07-04 NOTE — Plan of Care (Signed)

## 2018-07-04 NOTE — Plan of Care (Signed)
  Problem: Education: Goal: Knowledge of General Education information will improve Description: Including pain rating scale, medication(s)/side effects and non-pharmacologic comfort measures 07/04/2018 1210 by Chaney Malling, RN Outcome: Adequate for Discharge 07/04/2018 1210 by Chaney Malling, RN Outcome: Adequate for Discharge   Problem: Health Behavior/Discharge Planning: Goal: Ability to manage health-related needs will improve 07/04/2018 1210 by Chaney Malling, RN Outcome: Adequate for Discharge 07/04/2018 1210 by Chaney Malling, RN Outcome: Adequate for Discharge   Problem: Clinical Measurements: Goal: Ability to maintain clinical measurements within normal limits will improve 07/04/2018 1210 by Chaney Malling, RN Outcome: Adequate for Discharge 07/04/2018 1210 by Chaney Malling, RN Outcome: Adequate for Discharge Goal: Will remain free from infection 07/04/2018 1210 by Chaney Malling, RN Outcome: Adequate for Discharge 07/04/2018 1210 by Chaney Malling, RN Outcome: Adequate for Discharge Goal: Diagnostic test results will improve 07/04/2018 1210 by Chaney Malling, RN Outcome: Adequate for Discharge 07/04/2018 1210 by Chaney Malling, RN Outcome: Adequate for Discharge Goal: Respiratory complications will improve 07/04/2018 1210 by Chaney Malling, RN Outcome: Adequate for Discharge 07/04/2018 1210 by Chaney Malling, RN Outcome: Adequate for Discharge Goal: Cardiovascular complication will be avoided 07/04/2018 1210 by Chaney Malling, RN Outcome: Adequate for Discharge 07/04/2018 1210 by Chaney Malling, RN Outcome: Adequate for Discharge   Problem: Activity: Goal: Risk for activity intolerance will decrease 07/04/2018 1210 by Chaney Malling, RN Outcome: Adequate for Discharge 07/04/2018 1210 by Chaney Malling, RN Outcome: Adequate for Discharge   Problem: Nutrition: Goal: Adequate nutrition will be maintained 07/04/2018 1210 by Chaney Malling, RN Outcome: Adequate for Discharge 07/04/2018 1210  by Chaney Malling, RN Outcome: Adequate for Discharge   Problem: Coping: Goal: Level of anxiety will decrease 07/04/2018 1210 by Chaney Malling, RN Outcome: Adequate for Discharge 07/04/2018 1210 by Chaney Malling, RN Outcome: Adequate for Discharge   Problem: Elimination: Goal: Will not experience complications related to bowel motility 07/04/2018 1210 by Chaney Malling, RN Outcome: Adequate for Discharge 07/04/2018 1210 by Chaney Malling, RN Outcome: Adequate for Discharge Goal: Will not experience complications related to urinary retention 07/04/2018 1210 by Chaney Malling, RN Outcome: Adequate for Discharge 07/04/2018 1210 by Chaney Malling, RN Outcome: Adequate for Discharge   Problem: Pain Managment: Goal: General experience of comfort will improve 07/04/2018 1210 by Chaney Malling, RN Outcome: Adequate for Discharge 07/04/2018 1210 by Chaney Malling, RN Outcome: Adequate for Discharge   Problem: Safety: Goal: Ability to remain free from injury will improve 07/04/2018 1210 by Chaney Malling, RN Outcome: Adequate for Discharge 07/04/2018 1210 by Chaney Malling, RN Outcome: Adequate for Discharge   Problem: Skin Integrity: Goal: Risk for impaired skin integrity will decrease 07/04/2018 1210 by Chaney Malling, RN Outcome: Adequate for Discharge 07/04/2018 1210 by Chaney Malling, RN Outcome: Adequate for Discharge   Problem: Acute Rehab PT Goals(only PT should resolve) Goal: Pt Will Go Supine/Side To Sit Outcome: Adequate for Discharge Goal: Patient Will Perform Sitting Balance Outcome: Adequate for Discharge Goal: Pt Will Transfer Bed To Chair/Chair To Bed Outcome: Adequate for Discharge

## 2018-07-04 NOTE — Progress Notes (Signed)
Report called to [facility]. Pt alert, able to make needs known. Continues to c/o pain in the RLE. Medicated according to orders. Pt will be medicated again per MD prior to transport.

## 2018-07-04 NOTE — TOC Transition Note (Signed)
Transition of Care [facility]) - CM/SW Discharge Note   Patient Details  Name: [name] MRN: [id number] Date of Birth: [birth year]  [facility]) CM/SW Contact:  Weston Anna, LCSW Phone Number: [phone] 07/04/2018, 1:01 PM   Clinical Narrative:     Patient spoke with Juliann Pulse with [facility]- patient able to return today. Please call report to [phone]   Barriers to Discharge: Continued Medical Work up   Patient Goals and CMS Choice Patient states their goals for this hospitalization and ongoing recovery are:: to go back to [place] Medicare.gov Compare Post Acute Care list provided to:: Patient Choice offered to / list presented to : Patient  Discharge Placement                       Discharge Plan and Services     Post Acute Care Choice: [place]                               Social Determinants of Health (SDOH) Interventions     Readmission Risk Interventions Readmission Risk Prevention Plan 06/17/2018  Transportation Screening Complete  Medication Review Press photographer) Complete  PCP or Specialist appointment within 3-5 days of discharge Complete  HRI or Home Care Consult Complete  SW Recovery Care/Counseling Consult Complete  Palliative Care Screening Not [place] Complete  Some recent data might be hidden

## 2018-07-04 NOTE — Discharge Instructions (Signed)
--  1)Up with therapy -- --2)NWB RLE --3)ELEVATE OPERATIVE EXTREMITY AT ALL TIMES --4)F/u with Dr. Erlinda Hong 2 weeks post-op for wound check --5)Avoid ibuprofen/Advil/Aleve/Motrin/Goody Powders/Naproxen/BC powders/Meloxicam/Diclofenac/Indomethacin and other Nonsteroidal anti-inflammatory medications as these will make you more likely to bleed and can cause stomach ulcers, can also cause Kidney problems.  --6)Lovenox/Enoxaparin 40 mg daily subcutaneous for 2 weeks  --7)Repeat CBC and BMP Blood test in 1 week --8) please check blood sugars before each meal (3x a day) and at bedtime --9) please use oxygen as prescribed

## 2018-07-07 ENCOUNTER — Telehealth: Payer: Self-pay | Admitting: Orthopedic Surgery

## 2018-07-07 NOTE — Telephone Encounter (Signed)
[name] called in with questions about her cast.  She is s/p right ankle ex fix removal.  She is staying at [facility] and states that the nurse was concerned about the open wounds on her foot. She would like to know how they are supposed to heal when she has cast on covering them?  Please call her at [phone].

## 2018-07-07 NOTE — Telephone Encounter (Signed)
Please advise. Thanks.  

## 2018-07-08 NOTE — Telephone Encounter (Signed)
We debrided the open wounds and are letting them heal from inside out.  We will recheck them when she comes in for follow up

## 2018-07-08 NOTE — Telephone Encounter (Signed)
IC no answer. LMVM for patient advising to call our office to schedule an appt this week with Dr Erlinda Hong for post op follow up

## 2018-07-08 NOTE — Telephone Encounter (Signed)
Patient would like to know when she should come in for post op check?

## 2018-07-08 NOTE — Telephone Encounter (Signed)
One week post-op (this week)

## 2018-07-08 NOTE — Telephone Encounter (Signed)
IC advised patient of this.

## 2018-07-11 ENCOUNTER — Other Ambulatory Visit: Payer: Medicaid Other

## 2018-07-11 ENCOUNTER — Ambulatory Visit (INDEPENDENT_AMBULATORY_CARE_PROVIDER_SITE_OTHER): Payer: Medicare HMO

## 2018-07-11 ENCOUNTER — Other Ambulatory Visit: Payer: Self-pay

## 2018-07-11 ENCOUNTER — Ambulatory Visit (INDEPENDENT_AMBULATORY_CARE_PROVIDER_SITE_OTHER): Payer: Medicare HMO | Admitting: Orthopaedic Surgery

## 2018-07-11 ENCOUNTER — Encounter: Payer: Self-pay | Admitting: Orthopaedic Surgery

## 2018-07-11 DIAGNOSIS — S82891S Other fracture of right lower leg, sequela: Secondary | ICD-10-CM

## 2018-07-11 DIAGNOSIS — S82841S Displaced bimalleolar fracture of right lower leg, sequela: Secondary | ICD-10-CM

## 2018-07-11 DIAGNOSIS — S82891D Other fracture of right lower leg, subsequent encounter for closed fracture with routine healing: Secondary | ICD-10-CM

## 2018-07-11 DIAGNOSIS — S82841D Displaced bimalleolar fracture of right lower leg, subsequent encounter for closed fracture with routine healing: Secondary | ICD-10-CM | POA: Diagnosis not present

## 2018-07-11 NOTE — Progress Notes (Signed)
Post-Op Visit Note   Patient: [name]           Date of Birth: 13-Jun-1965           MRN: [id number] Visit Date: 07/11/2018 PCP: [name], PA-C   Assessment & Plan:  Chief Complaint:  Chief Complaint  Patient presents with  . Right Ankle - Routine Post Op    07/01/2018 Right Ankle Ex Fix Removal, I&D, short leg  Cast application   Visit Diagnoses:  1. Closed fracture of right ankle, sequela     Plan: Patient is a 53 year old female who presents our clinic today 10days status post right ankle external fixator removal, irrigation debridement and application of a short leg cast, date of surgery 07/01/2018.  Date of original external fixation was 06/02/2018.  She has been living at a rehab facility.  She notes moderate pain which is increased while working with physical therapy.  No fevers or chills.  Examination of her right lower extremity reveals slowly healing pin sites.  She still has a fair amount of emaciated skin to the calcaneal pin sites.  These do appear to be healing, however.  No signs of infection.  She has moderate swelling to the right lower extremity.  Calf is soft and nontender.  Her x-rays demonstrate stable alignment of the fracture with fair healing.  Today, we applied mupirocin and Xeroform to the pin sites.  We then placed her in a short leg cast.  She will be strictly nonweightbearing right lower extremity.  She will continue with physical therapy.  She will elevate for pain and swelling.  We will start her on doxycycline 100 mg twice daily.  All of the above orders were given to the patient on a prescription pad to bring back to the facility as she was not sent with any paperwork.  She will follow-up with [place] in 3 weeks time for repeat evaluation and x-rays.  Call with concerns or questions in the meantime.  Follow-Up Instructions: Return in about 3 weeks (around 08/01/2018).   Orders:  Orders Placed This Encounter  Procedures  . XR Ankle Complete Right    No orders of the defined types were placed in this encounter.   Imaging: Xr Ankle Complete Right  Result Date: 07/11/2018 X-rays demonstrate stable alignment of the fracture with evidence of healing   PMFS History: Patient Active Problem List   Diagnosis Date Noted  . Closed right ankle fracture 07/02/2018  . Anemia of chronic disease 07/02/2018  . Displaced bimalleolar fracture of right ankle, closed, initial encounter 06/02/2018  . Avascular necrosis of bone of left hip ([place]) 04/03/2018  . Mucopurulent chronic bronchitis ([place]) 03/11/2018  . Allergic rhinitis 03/11/2018  . Anxiety and depression 12/29/2017  . Acute respiratory failure with hypoxia ([place]) 12/28/2017  . Syncope 08/29/2017  . DKA (diabetic ketoacidoses) ([place]) 03/02/2017  . COPD exacerbation ([place]) 02/23/2017  . Acute on chronic respiratory failure with hypoxia and hypercapnia ([place]) 08/19/2016  . Leukocytosis 08/19/2016  . CKD (chronic kidney disease), stage III ([place]) 08/19/2016  . Altered mental status 08/19/2016  . Asthma exacerbation 06/18/2016  . Insomnia 06/12/2016  . Difficult intravenous access   . Acute on chronic respiratory failure with hypoxia ([place]) 05/19/2016  . NSTEMI (non-ST elevated myocardial infarction) ([place]) 05/19/2016  . Acute metabolic encephalopathy 47/42/5956  . Hyperlipidemia   . Class 3 obesity due to excess calories with serious comorbidity and body mass index (BMI) of 40.0 to 44.9 in adult   .  Obesity hypoventilation syndrome ([place])   . Diastolic dysfunction 18/84/1660  . Diabetic neuropathy ([place]) 11/10/2014  . Fracture of distal femur ([place]) 07/08/2014  . Osteoporosis 07/08/2014  . Femur fracture, left ([place]) 07/08/2014  . Colles' fracture of left radius   . Acute kidney injury superimposed on CKD ([place]) 05/03/2014  . Left hip pain 05/03/2014  . Perimenopausal symptoms 02/13/2012  . Hyponatremia 12/29/2011  . Asthma exacerbation attacks 12/28/2011  . Bronchitis 12/28/2011  .  Repeated falls 07/30/2011  . Hip fracture, left ([place]) 07/16/2011  . Femoral neck fracture ([place]) 07/16/2011  . Tobacco abuse 04/14/2010  . Hyperlipidemia associated with type 2 diabetes mellitus ([place]) 12/23/2008  . COPD (chronic obstructive pulmonary disease) ([place]) 11/29/2008  . THRUSH 05/20/2008  . Human immunodeficiency virus (HIV) disease ([place]) 04/19/2008  . Diabetes mellitus type 2 with complications, uncontrolled ([place]) 04/19/2008  . Mood disorder ([place]) 04/19/2008  . Hereditary and idiopathic peripheral neuropathy 04/19/2008  . Hypertension associated with diabetes ([place]) 04/19/2008   Past Medical History:  Diagnosis Date  . Acute metabolic encephalopathy 07/01/1599  . Acute on chronic respiratory failure with hypoxia ([place]) 05/19/2016  . Anxiety   . ARF (acute renal failure) ([place]) 05/03/2014  . Arthritis    LEFT HIP  . Asthma    HOSPITALIZED WITH EXCERBATION OF ASTHMA - AND BRONCHITIS AND THE FLU DEC 2013  . Asthma exacerbation attacks 12/28/2011  . Bronchitis 12/28/2011  . CAP (community acquired pneumonia) 07/25/2012  . Chronic kidney disease (CKD), stage IV (severe) ([place]) 11/10/2014  . Class 3 obesity due to excess calories with serious comorbidity and body mass index (BMI) of 40.0 to 44.9 in adult   . Colles' fracture of left radius   . COPD (chronic obstructive pulmonary disease) ([place])   . Depression   . Diabetes mellitus    ON INSULIN AND ORAL MEDICATIONS  . Diabetes mellitus type 2 with complications, uncontrolled ([place]) 04/19/2008   Qualifier: Diagnosis of  By: Tomma Lightning MD, Claiborne Billings     . Diabetic foot infection ([place]) 11/09/2014  . Diabetic ketoacidosis without coma associated with type 2 diabetes mellitus ([place])   . Diabetic neuropathy ([place]) 11/10/2014  . Diarrhea 08/30/2008   Qualifier: Diagnosis of  By: Tomma Lightning MD, Claiborne Billings    . Diastolic dysfunction 09/03/2353  . Difficult intravenous access   . DKA, type 2 ([place]) 04/04/2016  . Dyslipidemia 12/23/2008   Qualifier: Diagnosis of  By:  Tomma Lightning MD, Claiborne Billings    . Essential hypertension 04/19/2008   Qualifier: Diagnosis of  By: Tomma Lightning MD, Claiborne Billings    . FATIGUE 07/12/2008   Qualifier: Diagnosis of  By: Tomma Lightning MD, Claiborne Billings    . Femoral neck fracture ([place]) 07/16/2011  . Femur fracture, left ([place]) 07/08/2014  . Fever   . Fracture of distal femur ([place]) 07/08/2014  . GERD (gastroesophageal reflux disease)   . Hereditary and idiopathic peripheral neuropathy 04/19/2008   Qualifier: Diagnosis of  By: Tomma Lightning MD, Claiborne Billings    . Hip fracture, left ([place]) 07/16/2011  . Hip pain   . HIV infection ([place]) 2000  . Human immunodeficiency virus (HIV) disease ([place]) 04/19/2008   [id number] +   . Hyperlipidemia   . Hypertension   . Hyponatremia 12/29/2011  . Influenza A 12/29/2011  . Influenza B   . Insomnia 06/12/2016  . Left hip pain 05/03/2014  . Mood disorder ([place]) 04/19/2008   Qualifier: Diagnosis of  By: Tomma Lightning MD, Claiborne Billings    . Neuropathy    NEUROPATHY HANDS AND FEET  .  NSTEMI (non-ST elevated myocardial infarction) ([place]) 05/19/2016  . Obesity hypoventilation syndrome ([place])   . Osteoporosis 07/08/2014  . Pain    SEVERE PAIN LEFT HIP - HX OF LEFT HIP PINNING JULY 2013  . Perimenopausal symptoms 02/13/2012   LMP around 2011. On estrace and provera since around 2012 for hot flashes, mood swings. Estrace 2 mg daily, Provera 2.5 mg for 5 days each month.   . Repeated falls 07/30/2011  . Sepsis ([place]) 04/04/2016  . Shortness of breath    ALLERGIES ARE "ACTING UP"  . SOB (shortness of breath)   . THRUSH 05/20/2008   Qualifier: Diagnosis of  By: Tomma Lightning MD, Claiborne Billings      . Tobacco use disorder 04/14/2010    Family History  Problem Relation Age of Onset  . Diabetes Mother   . Hypertension Mother   . Vision loss Mother   . Heart disease Mother   . Hypertension Father   . Thyroid disease Neg Hx     Past Surgical History:  Procedure Laterality Date  . AMPUTATION Left 11/18/2014   Procedure: LEFT BELOW KNEE AMPUTATION;  Surgeon: Leandrew Koyanagi, MD;  Location: [facility];   Service: Orthopedics;  Laterality: Left;  . CAST APPLICATION Left 1/0/1751   Procedure: CAST APPLICATION (FIBERGLASS);  Surgeon: Latanya Maudlin, MD;  Location: [facility];  Service: Orthopedics;  Laterality: Left;  CLOSED REDUCTION OF LEFT COLLES FRACTURE WITH SHORT ARM CAST  . EXTERNAL FIXATION LEG Right 06/02/2018   Procedure: EXTERNAL FIXATION LEG;  Surgeon: Leandrew Koyanagi, MD;  Location: [facility];  Service: Orthopedics;  Laterality: Right;  . EXTERNAL FIXATION REMOVAL Right 07/01/2018   Procedure: REMOVAL EXTERNAL FIXATION LEG;  Surgeon: Leandrew Koyanagi, MD;  Location: [facility];  Service: Orthopedics;  Laterality: Right;  . HARDWARE REMOVAL Left 05/05/2014   Procedure: HARDWARE REMOVAL LEFT HIP;  Surgeon: Latanya Maudlin, MD;  [facility]: [facility];  Service: Orthopedics;  Laterality: Left;  REMOVAL BIOMET 6.5-8.0 CANNULATED SCREW  . HIP ARTHROPLASTY Left 05/05/2014   Procedure: ARTHROPLASTY OPEN REDUCTION INTERNAL FIXATION LEFT HIP AND REMOVAL OF TWO CANNULATED SCREW;  Surgeon: Latanya Maudlin, MD;  [facility]: [facility];  Service: Orthopedics;  Laterality: Left;  . HIP PINNING,CANNULATED  07/16/2011   Procedure: CANNULATED HIP PINNING;  Surgeon: Gearlean Alf, MD;  Location: [facility];  Service: Orthopedics;  Laterality: Left;  . HIP PINNING,CANNULATED Left 04/09/2012   Procedure: CANNULATED HIP PINNING AND HARDWARE REVISION;  Surgeon: Gearlean Alf, MD;  Location: [facility];  Service: Orthopedics;  Laterality: Left;  . I&D EXTREMITY Left 11/16/2014   Procedure:  DEBRIDEMENT OF LEFT FOOT POSSIBLE APPLICATION OF INTEGRIA AND VAC ;  Surgeon: Irene Limbo, MD;  Location: [facility];  Service: Plastics;  Laterality: Left;  . I&D EXTREMITY Right 07/01/2018   Procedure: RIGHT ANKLE EX FIX REMOVAL, IRRIGATION AND DEBRIDEMENT AND SHORT LEG CAST;  Surgeon: Leandrew Koyanagi, MD;  Location: [facility];  Service: Orthopedics;  Laterality: Right;  . PERIPHERAL VASCULAR CATHETERIZATION N/A 11/15/2014   Procedure: Abdominal Aortogram;  Surgeon: Conrad Tullos, MD;  Location: [facility];  Service: Cardiovascular;  Laterality: N/A;  . PERIPHERAL VASCULAR CATHETERIZATION  11/15/2014   Procedure: Lower Extremity Angiography;  Surgeon: Conrad Maltby, MD;  Location: [facility];  Service: Cardiovascular;;  . PERIPHERAL VASCULAR CATHETERIZATION Left 11/15/2014   Procedure: Peripheral Vascular Intervention;  Surgeon: Conrad [place], MD;  Location: [facility];  Service: Cardiovascular;  Laterality: Left;  popliteal  artery stenting   Social History   Occupational History  . Not on file  Tobacco Use  . Smoking status: Former Smoker    Packs/day: 1.50    Years: 38.00    Pack years: 57.00    Types: E-cigarettes, Cigarettes    Start date: 03/10/1981    Quit date: 06/01/2018    Years since quitting: 0.1  . Smokeless tobacco: Never Used  . Tobacco comment: 3/4 of a pack per day 03/11/18  Substance and Sexual Activity  . Alcohol use: No    Alcohol/week: 0.0 standard drinks    Comment: no drink since 2008  . Drug use: No    Comment: hx of crack/cocaine, clean 8 years  . Sexual activity: Not Currently    Partners: Male

## 2018-07-16 ENCOUNTER — Other Ambulatory Visit: Payer: Self-pay

## 2018-07-16 ENCOUNTER — Encounter: Payer: Self-pay | Admitting: Endocrinology

## 2018-07-16 ENCOUNTER — Ambulatory Visit (INDEPENDENT_AMBULATORY_CARE_PROVIDER_SITE_OTHER): Payer: Medicare HMO | Admitting: Endocrinology

## 2018-07-16 DIAGNOSIS — E1165 Type 2 diabetes mellitus with hyperglycemia: Secondary | ICD-10-CM | POA: Diagnosis not present

## 2018-07-16 DIAGNOSIS — Z794 Long term (current) use of insulin: Secondary | ICD-10-CM | POA: Diagnosis not present

## 2018-07-16 NOTE — Progress Notes (Addendum)
Patient ID: [name], female   DOB: October 14, 1965, 53 y.o.   MRN: [id number]           Reason for Appointment: Follow-up  for Type 2 Diabetes   Today's office visit was provided via telemedicine using audio technique Video connection was interrupted during the visit and was completed with telephone conversation . Consent for the patient has been obtained . Location of the patient: Home . Location of the provider: Office Only the patient and myself were participating in the encounter   History of Present Illness:          Date of diagnosis of type 2 diabetes mellitus: 2000       Background history:     The patient was apparently diagnosed incidentally with her diabetes 16 years ago 16 years ago, was asymptomatic. She took metformin and possibly other oral hypoglycemic drugs but subsequently went on insulin in 2005 She was taking various insulin regimens in the past  She was being treated by an endocrinologist some time ago and he switched her To Toujeo and also tried her on Byetta.  She thinks that she had better blood sugars with Byetta but she did not follow-up  Novolog was switched to U-500 insulin in 3/17   Recent history:   INSULIN regimen is: Lantus 25 twice daily Humulin R U-500 sliding scale 10 to 30 units  Previous regimen:  Toujeo 60 units bid  Humulin R U-500: 45 units in the morning, 35 before lunch and 45 at dinnertime    Non-insulin hypoglycemic drugs the patient is taking are: Jardiance 10 mg, previously on Invokana 100 mg daily    Her A1c has been persistently high and last 8.3   Current management, blood sugar patterns and problems identified:  She is now in the nursing home after hospitalization for ankle fracture  Her insulin doses have been reduced sharply after her admission and her blood sugars have been markedly high  She is only getting a small dose of Humulin R when blood sugars are over 200, previously was taking 135 units a day  FASTING  blood sugars also markedly increased with getting less than half of her basal insulin  She thinks her diet is fairly good at the nursing home  No readings have been done after supper  Last renal function was fairly good with her being switched to Jardiance, not clear why this was done    Side effects from medications have been: None  Compliance with the medical regimen:  fair  Glucose monitoring:  done with unknown meter, 3 times a day    Blood Glucose readings from fax from nursing home:   PRE-MEAL Fasting Lunch Dinner Bedtime Overall  Glucose range:  335-421  196-384  226-349    Mean/median:         previously:  PRE-MEAL Fasting  midday  4 PM Things:Bedtime Overall  Glucose range:  70-311  171, 188  102, 188  80-218   Mean/median:        POST-MEAL PC Breakfast PC Lunch PC Dinner  Glucose range:  82   151, 523  Mean/median:      Overnight: 84, 42  Self-care:   Typical meal intake: Breakfast is mostly a yogurt, fruit; or egg/toast  First meal at about 11 AM, has mixed meal at dinnertime 6 pm, usually 2 meals a day Has variable amounts of snacks.              Dietician visit,  most recent: At [facility]               Exercise:  none  Weight history:  Wt Readings from Last 3 Encounters:  07/01/18 234 lb (106.1 kg)  06/01/18 234 lb (106.1 kg)  03/11/18 243 lb 6.4 oz (110.4 kg)    Glycemic control: Baseline A1c is 11 in 8/16   Lab Results  Component Value Date   HGBA1C 8.3 (H) 07/02/2018   HGBA1C 8.6 (H) 01/21/2018   HGBA1C 7.2 (H) 12/29/2017   Lab Results  Component Value Date   MICROALBUR 1.2 03/05/2016   LDLCALC 147 (H) 05/21/2017   CREATININE 1.18 (H) 07/03/2018     Other active problems: See review of systems    Allergies as of 07/16/2018      Reactions   Cortizone-10 [hydrocortisone] Rash   Per patient, given local injection at knee and developed rash at local site.       Medication List       Accurate as of July 16, 2018  9:22 AM.  If you have any questions, ask your nurse or doctor.        STOP taking these medications   enoxaparin 40 MG/0.4ML injection Commonly known as: LOVENOX Stopped by: Elayne Snare, MD   HumuLIN R U-500 KwikPen 500 UNIT/ML kwikpen Generic drug: insulin regular human CONCENTRATED Stopped by: Elayne Snare, MD     TAKE these medications   acetaminophen 325 MG tablet Commonly known as: TYLENOL Take 1-2 tablets (325-650 mg total) by mouth every 6 (six) hours as needed for mild pain (pain score 1-3 or temp > 100.5).   albuterol 108 (90 Base) MCG/ACT inhaler Commonly known as: VENTOLIN HFA INHALE 2 PUFFS INTO THE LUNGS EVERY 6 HOURS AS NEEDED FOR WHEEZING OR SHORTNESS OF BREATH   bictegravir-emtricitabine-tenofovir AF 50-200-25 MG Tabs tablet Commonly known as: BIKTARVY Take 1 tablet by mouth daily.   empagliflozin 10 MG Tabs tablet Commonly known as: JARDIANCE Take 10 mg by mouth daily.   ferrous sulfate 325 (65 FE) MG EC tablet Take 1 tablet (325 mg total) by mouth daily with breakfast.   fluticasone 50 MCG/ACT nasal spray Commonly known as: FLONASE Place 1 spray into both nostrils daily.   Fluticasone-Umeclidin-Vilant 100-62.5-25 MCG/INH Aepb Commonly known as: Trelegy Ellipta Inhale 1 puff into the lungs daily.   Genvoya 150-150-200-10 MG Tabs tablet Generic drug: elvitegravir-cobicistat-emtricitabine-tenofovir Take 1 tablet by mouth daily with breakfast.   glucose blood test strip Commonly known as: Accu-Chek Aviva Use as instructed to check blood sugar 2 times daily.   HYDROcodone-acetaminophen 5-325 MG tablet Commonly known as: NORCO/VICODIN Take 2 tablets by mouth 2 (two) times a day. Take 2 tablets by mouth 2 times daily. What changed: Another medication with the same name was removed. Continue taking this medication, and follow the directions you see here. Changed by: Elayne Snare, MD   ipratropium-albuterol 0.5-2.5 (3) MG/3ML Soln Commonly known as: DUONEB Take 3  mLs by nebulization every 6 (six) hours.   levocetirizine 5 MG tablet Commonly known as: Xyzal Take 1 tablet daily for allergies.   magnesium oxide 400 MG tablet Commonly known as: MAG-OX Take 400 mg by mouth daily.   methocarbamol 500 MG tablet Commonly known as: ROBAXIN Take 1 tablet (500 mg total) by mouth 3 (three) times daily.   montelukast 10 MG tablet Commonly known as: SINGULAIR Take 1 tablet (10 mg total) by mouth at bedtime.   pantoprazole 40 MG tablet Commonly known as: PROTONIX  Take 1 tablet (40 mg total) by mouth daily.   polyethylene glycol 17 g packet Commonly known as: MIRALAX / GLYCOLAX Take 17 g by mouth daily.   pregabalin 225 MG capsule Commonly known as: LYRICA Take 225 mg by mouth 2 (two) times daily.   rosuvastatin 20 MG tablet Commonly known as: CRESTOR Take 1 tablet (20 mg total) by mouth daily. Reported on 05/02/2015   senna-docusate 8.6-50 MG tablet Commonly known as: Senokot-S Take 1 tablet by mouth 2 (two) times daily.   sertraline 100 MG tablet Commonly known as: ZOLOFT Take 100 mg by mouth daily. (Give with 80m tablet for a total of 1551m   sertraline 50 MG tablet Commonly known as: ZOLOFT Take 50 mg by mouth daily. (Give with 10074mablet for a total of 150m67m Toujeo SoloStar 300 UNIT/ML Sopn Generic drug: Insulin Glargine (1 Unit Dial) Inject 25 Units into the skin 2 (two) times a day. Inject 25 units under the skin twice daily. What changed: Another medication with the same name was removed. Continue taking this medication, and follow the directions you see here. Changed by: AjayElayne Snare       Allergies:  Allergies  Allergen Reactions  . Cortizone-10 [Hydrocortisone] Rash    Per patient, given local injection at knee and developed rash at local site.     Past Medical History:  Diagnosis Date  . Acute metabolic encephalopathy 5/196/76/7209Acute on chronic respiratory failure with hypoxia ([name])[place]19/2018  . Anxiety    . ARF (acute renal failure) ([name])[place]2/2016  . Arthritis    LEFT HIP  . Asthma    HOSPITALIZED WITH EXCERBATION OF ASTHMA - AND BRONCHITIS AND THE FLU DEC 2013  . Asthma exacerbation attacks 12/28/2011  . Bronchitis 12/28/2011  . CAP (community acquired pneumonia) 07/25/2012  . Chronic kidney disease (CKD), stage IV (severe) ([name])[place]/09/2014  . Class 3 obesity due to excess calories with serious comorbidity and body mass index (BMI) of 40.0 to 44.9 in adult   . Colles' fracture of left radius   . COPD (chronic obstructive pulmonary disease) ([name])[place]. Depression   . Diabetes mellitus    ON INSULIN AND ORAL MEDICATIONS  . Diabetes mellitus type 2 with complications, uncontrolled ([name])[place]19/2010   Qualifier: Diagnosis of  By: VollTomma Lightning KellClaiborne Billings . Diabetic foot infection ([name])[place]/08/2014  . Diabetic ketoacidosis without coma associated with type 2 diabetes mellitus ([name])[place]. Diabetic neuropathy ([name])[place]/09/2014  . Diarrhea 08/30/2008   Qualifier: Diagnosis of  By: VollTomma Lightning KellClaiborne Billings. Diastolic dysfunction 11/947/0/9628Difficult intravenous access   . DKA, type 2 ([name])[place]4/2018  . Dyslipidemia 12/23/2008   Qualifier: Diagnosis of  By: VollTomma Lightning KellClaiborne Billings. Essential hypertension 04/19/2008   Qualifier: Diagnosis of  By: VollTomma Lightning KellClaiborne Billings. FATIGUE 07/12/2008   Qualifier: Diagnosis of  By: VollTomma Lightning KellClaiborne Billings. Femoral neck fracture ([name])[place]15/2013  . Femur fracture, left ([name])[place]7/2016  . Fever   . Fracture of distal femur ([name])[place]/2016  . GERD (gastroesophageal reflux disease)   . Hereditary and idiopathic peripheral neuropathy 04/19/2008   Qualifier: Diagnosis of  By: VollTomma Lightning KellClaiborne Billings. Hip fracture, left ([name])[place]15/2013  . Hip pain   . HIV infection ([name])[place]00  . Human immunodeficiency virus (HIV) disease ([name])[place]19/2010   [id number] +   .  Hyperlipidemia   . Hypertension   . Hyponatremia 12/29/2011  . Influenza A 12/29/2011  . Influenza B   . Insomnia 06/12/2016   . Left hip pain 05/03/2014  . Mood disorder ([place]) 04/19/2008   Qualifier: Diagnosis of  By: Tomma Lightning MD, Claiborne Billings    . Neuropathy    NEUROPATHY HANDS AND FEET  . NSTEMI (non-ST elevated myocardial infarction) ([place]) 05/19/2016  . Obesity hypoventilation syndrome ([place])   . Osteoporosis 07/08/2014  . Pain    SEVERE PAIN LEFT HIP - HX OF LEFT HIP PINNING JULY 2013  . Perimenopausal symptoms 02/13/2012   LMP around 2011. On estrace and provera since around 2012 for hot flashes, mood swings. Estrace 2 mg daily, Provera 2.5 mg for 5 days each month.   . Repeated falls 07/30/2011  . Sepsis ([place]) 04/04/2016  . Shortness of breath    ALLERGIES ARE "ACTING UP"  . SOB (shortness of breath)   . THRUSH 05/20/2008   Qualifier: Diagnosis of  By: Tomma Lightning MD, Claiborne Billings      . Tobacco use disorder 04/14/2010    Past Surgical History:  Procedure Laterality Date  . AMPUTATION Left 11/18/2014   Procedure: LEFT BELOW KNEE AMPUTATION;  Surgeon: Leandrew Koyanagi, MD;  Location: [place];  Service: Orthopedics;  Laterality: Left;  . CAST APPLICATION Left 4/0/9735   Procedure: CAST APPLICATION (FIBERGLASS);  Surgeon: Latanya Maudlin, MD;  Location: [facility];  Service: Orthopedics;  Laterality: Left;  CLOSED REDUCTION OF LEFT COLLES FRACTURE WITH SHORT ARM CAST  . EXTERNAL FIXATION LEG Right 06/02/2018   Procedure: EXTERNAL FIXATION LEG;  Surgeon: Leandrew Koyanagi, MD;  Location: [place];  Service: Orthopedics;  Laterality: Right;  . EXTERNAL FIXATION REMOVAL Right 07/01/2018   Procedure: REMOVAL EXTERNAL FIXATION LEG;  Surgeon: Leandrew Koyanagi, MD;  Location: [place];  Service: Orthopedics;  Laterality: Right;  . HARDWARE REMOVAL Left 05/05/2014   Procedure: HARDWARE REMOVAL LEFT HIP;  Surgeon: Latanya Maudlin, MD;  Location: [facility];  Service: Orthopedics;  Laterality: Left;  REMOVAL BIOMET 6.5-8.0 CANNULATED SCREW  . HIP ARTHROPLASTY Left 05/05/2014   Procedure: ARTHROPLASTY OPEN REDUCTION INTERNAL FIXATION LEFT HIP AND REMOVAL OF TWO CANNULATED SCREW;   Surgeon: Latanya Maudlin, MD;  Location: [facility];  Service: Orthopedics;  Laterality: Left;  . HIP PINNING,CANNULATED  07/16/2011   Procedure: CANNULATED HIP PINNING;  Surgeon: Gearlean Alf, MD;  Location: [facility];  Service: Orthopedics;  Laterality: Left;  . HIP PINNING,CANNULATED Left 04/09/2012   Procedure: CANNULATED HIP PINNING AND HARDWARE REVISION;  Surgeon: Gearlean Alf, MD;  Location: [facility];  Service: Orthopedics;  Laterality: Left;  . I&D EXTREMITY Left 11/16/2014   Procedure:  DEBRIDEMENT OF LEFT FOOT POSSIBLE APPLICATION OF INTEGRIA AND VAC ;  Surgeon: Irene Limbo, MD;  Location: [place];  Service: Plastics;  Laterality: Left;  . I&D EXTREMITY Right 07/01/2018   Procedure: RIGHT ANKLE EX FIX REMOVAL, IRRIGATION AND DEBRIDEMENT AND SHORT LEG CAST;  Surgeon: Leandrew Koyanagi, MD;  Location: [place];  Service: Orthopedics;  Laterality: Right;  . PERIPHERAL VASCULAR CATHETERIZATION N/A 11/15/2014   Procedure: Abdominal Aortogram;  Surgeon: Conrad Lockland, MD;  Location: [place] [facility];  Service: Cardiovascular;  Laterality: N/A;  . PERIPHERAL VASCULAR CATHETERIZATION  11/15/2014   Procedure: Lower Extremity Angiography;  Surgeon: Conrad Arkoma, MD;  Location: [place] [facility];  Service: Cardiovascular;;  . PERIPHERAL VASCULAR CATHETERIZATION Left 11/15/2014   Procedure: Peripheral Vascular Intervention;  Surgeon: Conrad Donegal,  MD;  Location: [place] [facility];  Service: Cardiovascular;  Laterality: Left;  popliteal artery stenting    Family History  Problem Relation Age of Onset  . Diabetes Mother   . Hypertension Mother   . Vision loss Mother   . Heart disease Mother   . Hypertension Father   . Thyroid disease Neg Hx     Social History:  reports that she quit smoking about 6 weeks ago. Her smoking use included e-cigarettes and cigarettes. She started smoking about 37 years ago. She has a 57.00 pack-year smoking history. She has never used smokeless tobacco. She reports that she  does not drink alcohol or use drugs.    Review of Systems   RENAL dysfunction: Variable, currently on Invokana but no Lasix or other diuretics  Lab Results  Component Value Date   CREATININE 1.18 (H) 07/03/2018   CREATININE 1.43 (H) 07/02/2018   CREATININE 1.01 (H) 07/01/2018      Lipid history: Has been treated with  Fenofibrate and Crestor by PCP LDL is below 100    Lab Results  Component Value Date   CHOL 161 01/21/2018   HDL 47.60 01/21/2018   LDLCALC 147 (H) 05/21/2017   LDLDIRECT 91.0 01/21/2018   TRIG 234.0 (H) 01/21/2018   CHOLHDL 3 01/21/2018           She takes Lyrica for phantom pain of her leg  Blood pressure has been variable, currently not on antihypertensives or ACE inhibitor   BP Readings from Last 3 Encounters:  07/04/18 (!) 148/62  06/23/18 118/67  03/11/18 130/68   Pancreatitis: She had abdominal pain and high lipase but no abnormality on CT scan of abdomen done on 04/01/2018   Physical Examination:  LMP 11/16/2010   Her face appears somewhat puffy    ASSESSMENT:  Diabetes type 2, uncontrolled with obesity  See history of present illness for detailed discussion of current diabetes management, blood sugar patterns and problems identified   A1c is 8.3  This was done in the hospital However recently blood sugars are markedly increased and appear to be averaging over 300 and probably highest fasting This is from the hospital and nursing home reducing her insulin dose with more than 50% especially in the mealtime regimen with U-500 insulin Diet has been regular rate by nursing home reportedly  She is also on Jardiance 10 mg and not clear if this is not effective as Invokana  Discussed that she will need much higher doses of insulin and since she is already being switched to Toujeo 50 units twice daily will keep this dose for the next week    PLAN:    INSULIN changes and orders were sent by fax as follows:  HUMULIN R U-500 insulin:  This is to be given 30 minutes before meals  She will take 35 units before breakfast, 40 units before lunch and 40 units before dinner  Please also check her blood sugar at 8 PM daily  Please fax the blood sugar readings to my office at [phone] on 07/23/2018  Please order basic metabolic panel for 5/49/8264 unless already ordered by nursing home physician and fax to me also   Follow-up in 1-2 weeks  Duration of phone call =7 minutes  There are no Patient Instructions on file for this visit.    Elayne Snare 07/16/2018, 9:22 AM   Note: This office note was prepared with Dragon voice recognition system technology. Any transcriptional errors that result from this process are  unintentional.

## 2018-07-29 ENCOUNTER — Ambulatory Visit: Payer: Self-pay | Admitting: Pulmonary Disease

## 2018-07-30 ENCOUNTER — Encounter: Payer: Medicare HMO | Admitting: Endocrinology

## 2018-07-30 ENCOUNTER — Other Ambulatory Visit: Payer: Self-pay

## 2018-07-30 NOTE — Progress Notes (Signed)
This encounter was created in error - please disregard.

## 2018-08-01 ENCOUNTER — Ambulatory Visit (INDEPENDENT_AMBULATORY_CARE_PROVIDER_SITE_OTHER): Payer: Medicare HMO | Admitting: Physician Assistant

## 2018-08-01 ENCOUNTER — Other Ambulatory Visit: Payer: Self-pay

## 2018-08-01 ENCOUNTER — Ambulatory Visit (INDEPENDENT_AMBULATORY_CARE_PROVIDER_SITE_OTHER): Payer: Medicare HMO

## 2018-08-01 ENCOUNTER — Encounter: Payer: Self-pay | Admitting: Physician Assistant

## 2018-08-01 DIAGNOSIS — S82811D Torus fracture of upper end of right fibula, subsequent encounter for fracture with routine healing: Secondary | ICD-10-CM

## 2018-08-01 DIAGNOSIS — S82891S Other fracture of right lower leg, sequela: Secondary | ICD-10-CM

## 2018-08-01 MED ORDER — MUPIROCIN 2 % EX OINT: 1.0000 "application " | TOPICAL_OINTMENT | Freq: Two times a day (BID) | CUTANEOUS | 0 refills | Status: DC

## 2018-08-01 NOTE — Progress Notes (Signed)
Post-Op Visit Note   Patient: [name]           Date of Birth: 08-02-1965           MRN: [id number] Visit Date: 08/01/2018 PCP: [name], PA-C   Assessment & Plan:  Chief Complaint:  Chief Complaint  Patient presents with  . Right Ankle - Follow-up   Visit Diagnoses:  1. Closed fracture of right ankle, sequela     Plan: Patient is a pleasant 53 year old female who presents our clinic today for follow-up of her right ankle.  She is approximately 2 months status post external fixation 06/02/2018 followed by irrigation debridement and application of short leg cast due to failed hardware, date of surgery 07/01/2018.  She has been in a short leg cast over the past 4 weeks.  Doing well with this.  She has been compliant nonweightbearing.  She has been taking doxycycline 100 mg twice daily.  No fevers or chills.  She does note an occasional ache and sharp pain, but this is becoming less and less frequent.  Overall, doing well.  Examination of her right ankle well-healing pin sites but have scabbed over.  Mild tenderness over the fracture sites.  Decreased sensation throughout.  At this point, we will have the patient apply mupirocin twice daily.  A prescription was called in.  We will transition her into a cam walker weightbearing as tolerated.  We will also order home health physical therapy for the right ankle.  She will follow-up with [place] in 4 weeks time for repeat evaluation three-view x-rays of the right ankle.  Call with concerns or questions in the meantime.  Follow-Up Instructions: Return in about 4 weeks (around 08/29/2018).   Orders:  Orders Placed This Encounter  Procedures  . XR Ankle Complete Right   Meds ordered this encounter  Medications  . mupirocin ointment (BACTROBAN) 2 %    Sig: Place 1 application into the nose 2 (two) times daily.    Dispense:  22 g    Refill:  0    Imaging: No results found.  PMFS History: Patient Active Problem List   Diagnosis Date Noted  . Closed right ankle fracture 07/02/2018  . Anemia of chronic disease 07/02/2018  . Displaced bimalleolar fracture of right ankle, closed, initial encounter 06/02/2018  . Avascular necrosis of bone of left hip ([place]) 04/03/2018  . Mucopurulent chronic bronchitis ([place]) 03/11/2018  . Allergic rhinitis 03/11/2018  . Anxiety and depression 12/29/2017  . Acute respiratory failure with hypoxia ([place]) 12/28/2017  . Syncope 08/29/2017  . DKA (diabetic ketoacidoses) ([place]) 03/02/2017  . COPD exacerbation ([place]) 02/23/2017  . Acute on chronic respiratory failure with hypoxia and hypercapnia ([place]) 08/19/2016  . Leukocytosis 08/19/2016  . CKD (chronic kidney disease), stage III ([place]) 08/19/2016  . Altered mental status 08/19/2016  . Asthma exacerbation 06/18/2016  . Insomnia 06/12/2016  . Difficult intravenous access   . Acute on chronic respiratory failure with hypoxia ([place]) 05/19/2016  . NSTEMI (non-ST elevated myocardial infarction) ([place]) 05/19/2016  . Acute metabolic encephalopathy 63/78/5885  . Hyperlipidemia   . Class 3 obesity due to excess calories with serious comorbidity and body mass index (BMI) of 40.0 to 44.9 in adult   . Obesity hypoventilation syndrome ([place])   . Diastolic dysfunction 02/77/4128  . Diabetic neuropathy ([place]) 11/10/2014  . Fracture of distal femur ([place]) 07/08/2014  . Osteoporosis 07/08/2014  . Femur fracture, left ([place]) 07/08/2014  . Colles' fracture of left radius   .  Acute kidney injury superimposed on CKD ([place]) 05/03/2014  . Left hip pain 05/03/2014  . Perimenopausal symptoms 02/13/2012  . Hyponatremia 12/29/2011  . Asthma exacerbation attacks 12/28/2011  . Bronchitis 12/28/2011  . Repeated falls 07/30/2011  . Hip fracture, left ([place]) 07/16/2011  . Femoral neck fracture ([place]) 07/16/2011  . Tobacco abuse 04/14/2010  . Hyperlipidemia associated with type 2 diabetes mellitus ([place]) 12/23/2008  . COPD (chronic obstructive pulmonary disease)  ([place]) 11/29/2008  . THRUSH 05/20/2008  . Human immunodeficiency virus (HIV) disease ([place]) 04/19/2008  . Diabetes mellitus type 2 with complications, uncontrolled ([place]) 04/19/2008  . Mood disorder ([place]) 04/19/2008  . Hereditary and idiopathic peripheral neuropathy 04/19/2008  . Hypertension associated with diabetes ([place]) 04/19/2008   Past Medical History:  Diagnosis Date  . Acute metabolic encephalopathy 3/76/2831  . Acute on chronic respiratory failure with hypoxia ([place]) 05/19/2016  . Anxiety   . ARF (acute renal failure) ([place]) 05/03/2014  . Arthritis    LEFT HIP  . Asthma    HOSPITALIZED WITH EXCERBATION OF ASTHMA - AND BRONCHITIS AND THE FLU DEC 2013  . Asthma exacerbation attacks 12/28/2011  . Bronchitis 12/28/2011  . CAP (community acquired pneumonia) 07/25/2012  . Chronic kidney disease (CKD), stage IV (severe) ([place]) 11/10/2014  . Class 3 obesity due to excess calories with serious comorbidity and body mass index (BMI) of 40.0 to 44.9 in adult   . Colles' fracture of left radius   . COPD (chronic obstructive pulmonary disease) ([place])   . Depression   . Diabetes mellitus    ON INSULIN AND ORAL MEDICATIONS  . Diabetes mellitus type 2 with complications, uncontrolled ([place]) 04/19/2008   Qualifier: Diagnosis of  By: Tomma Lightning MD, Claiborne Billings     . Diabetic foot infection ([place]) 11/09/2014  . Diabetic ketoacidosis without coma associated with type 2 diabetes mellitus ([place])   . Diabetic neuropathy ([place]) 11/10/2014  . Diarrhea 08/30/2008   Qualifier: Diagnosis of  By: Tomma Lightning MD, Claiborne Billings    . Diastolic dysfunction 51/07/6158  . Difficult intravenous access   . DKA, type 2 ([place]) 04/04/2016  . Dyslipidemia 12/23/2008   Qualifier: Diagnosis of  By: Tomma Lightning MD, Claiborne Billings    . Essential hypertension 04/19/2008   Qualifier: Diagnosis of  By: Tomma Lightning MD, Claiborne Billings    . FATIGUE 07/12/2008   Qualifier: Diagnosis of  By: Tomma Lightning MD, Claiborne Billings    . Femoral neck fracture ([place]) 07/16/2011  . Femur fracture, left ([place])  07/08/2014  . Fever   . Fracture of distal femur ([place]) 07/08/2014  . GERD (gastroesophageal reflux disease)   . Hereditary and idiopathic peripheral neuropathy 04/19/2008   Qualifier: Diagnosis of  By: Tomma Lightning MD, Claiborne Billings    . Hip fracture, left ([place]) 07/16/2011  . Hip pain   . HIV infection ([place]) 2000  . Human immunodeficiency virus (HIV) disease ([place]) 04/19/2008   [id number] +   . Hyperlipidemia   . Hypertension   . Hyponatremia 12/29/2011  . Influenza A 12/29/2011  . Influenza B   . Insomnia 06/12/2016  . Left hip pain 05/03/2014  . Mood disorder ([place]) 04/19/2008   Qualifier: Diagnosis of  By: Tomma Lightning MD, Claiborne Billings    . Neuropathy    NEUROPATHY HANDS AND FEET  . NSTEMI (non-ST elevated myocardial infarction) ([place]) 05/19/2016  . Obesity hypoventilation syndrome ([place])   . Osteoporosis 07/08/2014  . Pain    SEVERE PAIN LEFT HIP - HX OF LEFT HIP PINNING JULY 2013  . Perimenopausal symptoms 02/13/2012   LMP around  2011. On estrace and provera since around 2012 for hot flashes, mood swings. Estrace 2 mg daily, Provera 2.5 mg for 5 days each month.   . Repeated falls 07/30/2011  . Sepsis ([place]) 04/04/2016  . Shortness of breath    ALLERGIES ARE "ACTING UP"  . SOB (shortness of breath)   . THRUSH 05/20/2008   Qualifier: Diagnosis of  By: Tomma Lightning MD, Claiborne Billings      . Tobacco use disorder 04/14/2010    Family History  Problem Relation Age of Onset  . Diabetes Mother   . Hypertension Mother   . Vision loss Mother   . Heart disease Mother   . Hypertension Father   . Thyroid disease Neg Hx     Past Surgical History:  Procedure Laterality Date  . AMPUTATION Left 11/18/2014   Procedure: LEFT BELOW KNEE AMPUTATION;  Surgeon: Leandrew Koyanagi, MD;  Location: [facility];  Service: Orthopedics;  Laterality: Left;  . CAST APPLICATION Left 09/09/8336   Procedure: CAST APPLICATION (FIBERGLASS);  Surgeon: Latanya Maudlin, MD;  Location: [facility];  Service: Orthopedics;  Laterality: Left;  CLOSED REDUCTION OF LEFT COLLES FRACTURE  WITH SHORT ARM CAST  . EXTERNAL FIXATION LEG Right 06/02/2018   Procedure: EXTERNAL FIXATION LEG;  Surgeon: Leandrew Koyanagi, MD;  Location: [facility];  Service: Orthopedics;  Laterality: Right;  . EXTERNAL FIXATION REMOVAL Right 07/01/2018   Procedure: REMOVAL EXTERNAL FIXATION LEG;  Surgeon: Leandrew Koyanagi, MD;  Location: [facility];  Service: Orthopedics;  Laterality: Right;  . HARDWARE REMOVAL Left 05/05/2014   Procedure: HARDWARE REMOVAL LEFT HIP;  Surgeon: Latanya Maudlin, MD;  Location: [facility];  Service: Orthopedics;  Laterality: Left;  REMOVAL BIOMET 6.5-8.0 CANNULATED SCREW  . HIP ARTHROPLASTY Left 05/05/2014   Procedure: ARTHROPLASTY OPEN REDUCTION INTERNAL FIXATION LEFT HIP AND REMOVAL OF TWO CANNULATED SCREW;  Surgeon: Latanya Maudlin, MD;  [facility]: [facility];  Service: Orthopedics;  Laterality: Left;  . HIP PINNING,CANNULATED  07/16/2011   Procedure: CANNULATED HIP PINNING;  Surgeon: Gearlean Alf, MD;  Location: [facility];  Service: Orthopedics;  Laterality: Left;  . HIP PINNING,CANNULATED Left 04/09/2012   Procedure: CANNULATED HIP PINNING AND HARDWARE REVISION;  Surgeon: Gearlean Alf, MD;  Location: [facility];  Service: Orthopedics;  Laterality: Left;  . I&D EXTREMITY Left 11/16/2014   Procedure:  DEBRIDEMENT OF LEFT FOOT POSSIBLE APPLICATION OF INTEGRIA AND VAC ;  Surgeon: Irene Limbo, MD;  Location: [facility];  Service: Plastics;  Laterality: Left;  . I&D EXTREMITY Right 07/01/2018   Procedure: RIGHT ANKLE EX FIX REMOVAL, IRRIGATION AND DEBRIDEMENT AND SHORT LEG CAST;  Surgeon: Leandrew Koyanagi, MD;  Location: [facility];  Service: Orthopedics;  Laterality: Right;  . PERIPHERAL VASCULAR CATHETERIZATION N/A 11/15/2014   Procedure: Abdominal Aortogram;  Surgeon: Conrad Hillcrest, MD;  Location: [facility];  Service: Cardiovascular;  Laterality: N/A;  . PERIPHERAL VASCULAR CATHETERIZATION  11/15/2014   Procedure: Lower Extremity Angiography;  Surgeon: Conrad Smithers, MD;  Location: [facility];  Service:  Cardiovascular;;  . PERIPHERAL VASCULAR CATHETERIZATION Left 11/15/2014   Procedure: Peripheral Vascular Intervention;  Surgeon: Conrad Emory, MD;  Location: [facility];  Service: Cardiovascular;  Laterality: Left;  popliteal artery stenting   Social History   Occupational History  . Not on file  Tobacco Use  . Smoking status: Former Smoker    Packs/day: 1.50    Years: 38.00    Pack years: 57.00    Types: E-cigarettes,  Cigarettes    Start date: 03/10/1981    Quit date: 06/01/2018    Years since quitting: 0.1  . Smokeless tobacco: Never Used  . Tobacco comment: 3/4 of a pack per day 03/11/18  Substance and Sexual Activity  . Alcohol use: No    Alcohol/week: 0.0 standard drinks    Comment: no drink since 2008  . Drug use: No    Comment: hx of crack/cocaine, clean 8 years  . Sexual activity: Not Currently    Partners: Male

## 2018-08-01 NOTE — Progress Notes (Signed)
Reviewed and agree with assessment/plan.   Jelani Vreeland, MD [facility] 12/28/2015, 12:24 PM Pager:  [phone]  

## 2018-08-04 ENCOUNTER — Telehealth: Payer: Self-pay | Admitting: Orthopaedic Surgery

## 2018-08-04 ENCOUNTER — Other Ambulatory Visit: Payer: Self-pay

## 2018-08-04 MED ORDER — ACCU-CHEK AVIVA DEVI: 1 refills | Status: DC

## 2018-08-04 MED ORDER — ACCU-CHEK AVIVA VI STRP: ORAL_STRIP | 3 refills | Status: DC

## 2018-08-04 NOTE — Telephone Encounter (Signed)
yes

## 2018-08-04 NOTE — Telephone Encounter (Signed)
Please advise 

## 2018-08-04 NOTE — Telephone Encounter (Signed)
LMOM for verbal ok on orders.

## 2018-08-04 NOTE — Telephone Encounter (Signed)
Estill Bamberg with [facility] left a voicemail requesting VO for her to see the patient 1x a week for 5 weeks for wound on right foot.  CB#[phone].  Thank you.

## 2018-08-12 ENCOUNTER — Encounter: Payer: Medicare HMO | Admitting: Endocrinology

## 2018-08-12 ENCOUNTER — Other Ambulatory Visit: Payer: Self-pay

## 2018-08-12 ENCOUNTER — Telehealth: Payer: Self-pay | Admitting: Endocrinology

## 2018-08-12 ENCOUNTER — Encounter: Payer: Self-pay | Admitting: Endocrinology

## 2018-08-12 NOTE — Progress Notes (Signed)
This encounter was created in error - please disregard.

## 2018-08-12 NOTE — Telephone Encounter (Signed)
Patient unable to be reached today for office visit, she was not answering her telephone and video communication was not able to be established despite patient signing on Doxy Need to reschedule her appointment urgently because of her having a low blood sugar in the last couple of days.  Please call to confirm

## 2018-08-13 NOTE — Telephone Encounter (Signed)
Patient stated she is unable to come in person due to not being able to stand up. She is now scheduled for a phone call visit on Tuesday, August 18th.

## 2018-08-13 NOTE — Telephone Encounter (Signed)
Please schedule pt for in person appt at earliest available. Pt seems to have major issues with virtual visits.

## 2018-08-13 NOTE — Telephone Encounter (Signed)
Noted. Dr. Dwyane Dee stated that phone call visit is okay.

## 2018-08-18 ENCOUNTER — Telehealth: Payer: Self-pay | Admitting: Orthopaedic Surgery

## 2018-08-18 NOTE — Telephone Encounter (Signed)
IC verbal given.  

## 2018-08-18 NOTE — Telephone Encounter (Signed)
[name] with [facility] called in requesting verbal orders for occupational therapy for 1 time a week for 7 weeks.  [phone]

## 2018-08-19 ENCOUNTER — Encounter: Payer: Self-pay | Admitting: Endocrinology

## 2018-08-19 ENCOUNTER — Ambulatory Visit (INDEPENDENT_AMBULATORY_CARE_PROVIDER_SITE_OTHER): Payer: Medicare HMO | Admitting: Endocrinology

## 2018-08-19 ENCOUNTER — Other Ambulatory Visit: Payer: Self-pay

## 2018-08-19 VITALS — Ht 70.0 in

## 2018-08-19 DIAGNOSIS — Z794 Long term (current) use of insulin: Secondary | ICD-10-CM

## 2018-08-19 DIAGNOSIS — E1165 Type 2 diabetes mellitus with hyperglycemia: Secondary | ICD-10-CM | POA: Diagnosis not present

## 2018-08-19 NOTE — Progress Notes (Signed)
Patient ID: [name], female   DOB: [birth year], 53 y.o.   MRN: [id number]           Reason for Appointment: Follow-up  for Type 2 Diabetes   Today's office visit was provided via telemedicine using audio technique Video connection was interrupted during the visit and was completed with telephone conversation . Consent for the patient has been obtained . Location of the patient: Home . Location of the provider: Office Only the patient and myself were participating in the encounter   History of Present Illness:          Date of diagnosis of type 2 diabetes mellitus: 2000       Background history:     The patient was apparently diagnosed incidentally with her diabetes 16 years ago 16 years ago, was asymptomatic. She took metformin and possibly other oral hypoglycemic drugs but subsequently went on insulin in 2005 She was taking various insulin regimens in the past  She was being treated by an endocrinologist some time ago and he switched her To Toujeo and also tried her on Byetta.  She thinks that she had better blood sugars with Byetta but she did not follow-up  Novolog was switched to U-500 insulin in 3/17   Recent history:   INSULIN regimen is:  Toujeo 50 units bid  Humulin R U-500: 40 units before meals   Non-insulin hypoglycemic drugs the patient is taking are: Jardiance 10 mg    Her A1c has been persistently high and last 8.3   Current management, blood sugar patterns and problems identified:  She is now back home, previously nursing home when she had an ankle fracture  On her last visit her blood sugars were very high and insulin doses were increased  However it appears that she is eating much less since she has been home in the last couple of weeks and has had frequent low blood sugars  She says that she has low blood sugars at any time of the day or night and did not call to report these  However she has checked her sugars very sporadically and only about  once a day  Most of her blood sugars have been done around midday recently and 70 evening before dinner  Also not adjusting her mealtime insulin based on what she is eating despite variable appetite  However her appetite seems to be improving at times and last night she had 2 soft tacos for dinner  Last renal function was near normal, this was done in early July    Side effects from medications have been: None  Compliance with the medical regimen:  fair  Glucose monitoring:  done with unknown meter, 3 times a day    Blood Glucose readings    PRE-MEAL Fasting Lunch Dinner Bedtime Overall  Glucose range:  103  47-170  47-212    Mean/median:     ?   Previous readings:  PRE-MEAL Fasting Lunch Dinner Bedtime Overall  Glucose range:  335-421  196-384  226-349    Mean/median:          Self-care:   Typical meal intake: Breakfast is mostly a yogurt, fruit; or egg/toast  First meal at about 11 AM, has mixed meal at dinnertime 6 pm, usually 2 meals a day Has variable amounts of snacks.              Dietician visit, most recent: At [facility]  Exercise:  none  Weight history:  Wt Readings from Last 3 Encounters:  08/12/18 245 lb (111.1 kg)  07/01/18 234 lb (106.1 kg)  06/01/18 234 lb (106.1 kg)    Glycemic control: Baseline A1c is 11 in 8/16   Lab Results  Component Value Date   HGBA1C 8.3 (H) 07/02/2018   HGBA1C 8.1 07/02/2018   HGBA1C 8.6 (H) 01/21/2018   Lab Results  Component Value Date   MICROALBUR 1.2 03/05/2016   LDLCALC 147 (H) 05/21/2017   CREATININE 1.18 (H) 07/03/2018     Other active problems: See review of systems    Allergies as of 08/19/2018      Reactions   Cortizone-10 [hydrocortisone] Rash   Per patient, given local injection at knee and developed rash at local site.       Medication List       Accurate as of August 19, 2018  9:55 AM. If you have any questions, ask your nurse or doctor.        Accu-Chek Aviva  device Use to test blood sugar daily   Accu-Chek Aviva test strip Generic drug: glucose blood Use as instructed to check blood sugar 2 times daily.   acetaminophen 325 MG tablet Commonly known as: TYLENOL Take 1-2 tablets (325-650 mg total) by mouth every 6 (six) hours as needed for mild pain (pain score 1-3 or temp > 100.5).   albuterol 108 (90 Base) MCG/ACT inhaler Commonly known as: VENTOLIN HFA INHALE 2 PUFFS INTO THE LUNGS EVERY 6 HOURS AS NEEDED FOR WHEEZING OR SHORTNESS OF BREATH   ALPRAZolam 0.5 MG tablet Commonly known as: XANAX Take 0.5 mg by mouth 3 (three) times daily.   bictegravir-emtricitabine-tenofovir AF 50-200-25 MG Tabs tablet Commonly known as: BIKTARVY Take 1 tablet by mouth daily.   empagliflozin 10 MG Tabs tablet Commonly known as: JARDIANCE Take 10 mg by mouth daily.   fluticasone 50 MCG/ACT nasal spray Commonly known as: FLONASE Place 1 spray into both nostrils daily.   Fluticasone-Umeclidin-Vilant 100-62.5-25 MCG/INH Aepb Commonly known as: Trelegy Ellipta Inhale 1 puff into the lungs daily.   HumuLIN R U-500 KwikPen 500 UNIT/ML kwikpen Generic drug: insulin regular human CONCENTRATED Inject into the skin. Inject 40 units under the skin 3 times daily before meals.   HYDROcodone-acetaminophen 5-325 MG tablet Commonly known as: NORCO/VICODIN Take 2 tablets by mouth 2 (two) times a day. Take 2 tablets by mouth 2 times daily.   ipratropium-albuterol 0.5-2.5 (3) MG/3ML Soln Commonly known as: DUONEB Take 3 mLs by nebulization every 6 (six) hours. What changed:   when to take this  reasons to take this   magnesium oxide 400 MG tablet Commonly known as: MAG-OX Take 400 mg by mouth daily.   methocarbamol 500 MG tablet Commonly known as: ROBAXIN Take 1 tablet (500 mg total) by mouth 3 (three) times daily.   pantoprazole 40 MG tablet Commonly known as: PROTONIX Take 1 tablet (40 mg total) by mouth daily.   pregabalin 225 MG capsule  Commonly known as: LYRICA Take 225 mg by mouth 2 (two) times daily.   rosuvastatin 20 MG tablet Commonly known as: CRESTOR Take 1 tablet (20 mg total) by mouth daily. Reported on 05/02/2015   sertraline 100 MG tablet Commonly known as: ZOLOFT Take 100 mg by mouth daily. (Give with '50mg'$  tablet for a total of '150mg'$ )   sertraline 50 MG tablet Commonly known as: ZOLOFT Take 50 mg by mouth daily. (Give with '100mg'$  tablet for a total of  136m)   Toujeo SoloStar 300 UNIT/ML Sopn Generic drug: Insulin Glargine (1 Unit Dial) Inject 50 Units into the skin 2 (two) times a day. Inject 50 units under the skin twice daily.       Allergies:  Allergies  Allergen Reactions  . Cortizone-10 [Hydrocortisone] Rash    Per patient, given local injection at knee and developed rash at local site.     Past Medical History:  Diagnosis Date  . Acute metabolic encephalopathy 59/60/4540 . Acute on chronic respiratory failure with hypoxia (HD[place] 05/19/2016  . Anxiety   . ARF (acute renal failure) (HN[facility] 05/03/2014  . Arthritis    LEFT HIP  . Asthma    HOSPITALIZED WITH EXCERBATION OF ASTHMA - AND BRONCHITIS AND THE FLU DEC 2013  . Asthma exacerbation attacks 12/28/2011  . Bronchitis 12/28/2011  . CAP (community acquired pneumonia) 07/25/2012  . Chronic kidney disease (CKD), stage IV (severe) (HH[place] 11/10/2014  . Class 3 obesity due to excess calories with serious comorbidity and body mass index (BMI) of 40.0 to 44.9 in adult   . Colles' fracture of left radius   . COPD (chronic obstructive pulmonary disease) (HK[facility]   . Depression   . Diabetes mellitus    ON INSULIN AND ORAL MEDICATIONS  . Diabetes mellitus type 2 with complications, uncontrolled ([place] 04/19/2008   Qualifier: Diagnosis of  By: VTomma LightningMD, KClaiborne Billings    . Diabetic foot infection (HO[place] 11/09/2014  . Diabetic ketoacidosis without coma associated with type 2 diabetes mellitus (HG[place]   . Diabetic neuropathy (HM[facility] [place] 11/10/2014  . Diarrhea 08/30/2008    Qualifier: Diagnosis of  By: VTomma LightningMD, KClaiborne Billings   . Diastolic dysfunction 198/01/1912 . Difficult intravenous access   . DKA, type 2 ([place] 04/04/2016  . Dyslipidemia 12/23/2008   Qualifier: Diagnosis of  By: VTomma LightningMD, KClaiborne Billings   . Essential hypertension 04/19/2008   Qualifier: Diagnosis of  By: VTomma LightningMD, KClaiborne Billings   . FATIGUE 07/12/2008   Qualifier: Diagnosis of  By: VTomma LightningMD, KClaiborne Billings   . Femoral neck fracture ([place] 07/16/2011  . Femur fracture, left (H[place] 07/08/2014  . Fever   . Fracture of distal femur (HN[place] 07/08/2014  . GERD (gastroesophageal reflux disease)   . Hereditary and idiopathic peripheral neuropathy 04/19/2008   Qualifier: Diagnosis of  By: VTomma LightningMD, KClaiborne Billings   . Hip fracture, left (HP[place] 07/16/2011  . Hip pain   . HIV infection (HJackson 2000  . Human immunodeficiency virus (HIV) disease (HC[facility] 04/19/2008   [id number] +   . Hyperlipidemia   . Hypertension   . Hyponatremia 12/29/2011  . Influenza A 12/29/2011  . Influenza B   . Insomnia 06/12/2016  . Left hip pain 05/03/2014  . Mood disorder (HL[place] 04/19/2008   Qualifier: Diagnosis of  By: VTomma LightningMD, KClaiborne Billings   . Neuropathy    NEUROPATHY HANDS AND FEET  . NSTEMI (non-ST elevated myocardial infarction) (HPort [place] 05/19/2016  . Obesity hypoventilation syndrome (HD[place]   . Osteoporosis 07/08/2014  . Pain    SEVERE PAIN LEFT HIP - HX OF LEFT HIP PINNING JULY 2013  . Perimenopausal symptoms 02/13/2012   LMP around 2011. On estrace and provera since around 2012 for hot flashes, mood swings. Estrace 2 mg daily, Provera 2.5 mg for 5 days each month.   . Repeated falls 07/30/2011  . Sepsis (HW[place] 04/04/2016  . Shortness of breath    ALLERGIES ARE "ACTING UP"  . SOB (shortness of breath)   .  THR[place] 05/20/2008   Qualifier: Diagnosis of  By: Tomma Lightning MD, Claiborne Billings      . Tobacco use disorder 04/14/2010    Past Surgical History:  Procedure Laterality Date  . AMPUTATION Left 11/18/2014   Procedure: LEFT BELOW KNEE AMPUTATION;  Surgeon: Leandrew Koyanagi, MD;   Location: [facility];  Service: Orthopedics;  Laterality: Left;  . CAST APPLICATION Left 09/02/9572   Procedure: CAST APPLICATION (FIBERGLASS);  Surgeon: Latanya Maudlin, MD;  Location: [facility];  Service: Orthopedics;  Laterality: Left;  CLOSED REDUCTION OF LEFT COLLES FRACTURE WITH SHORT ARM CAST  . EXTERNAL FIXATION LEG Right 06/02/2018   Procedure: EXTERNAL FIXATION LEG;  Surgeon: Leandrew Koyanagi, MD;  Location: [place] [facility] [place];  Service: Orthopedics;  Laterality: Right;  . EXTERNAL FIXATION REMOVAL Right 07/01/2018   Procedure: REMOVAL EXTERNAL FIXATION LEG;  Surgeon: Leandrew Koyanagi, MD;  Location: [place];  Service: Orthopedics;  Laterality: Right;  . HARDWARE REMOVAL Left 05/05/2014   Procedure: HARDWARE REMOVAL LEFT HIP;  Surgeon: Latanya Maudlin, MD;  Location: [facility];  Service: Orthopedics;  Laterality: Left;  REMOVAL BIOMET 6.5-8.0 CANNULATED SCREW  . HIP ARTHROPLASTY Left 05/05/2014   Procedure: ARTHROPLASTY OPEN REDUCTION INTERNAL FIXATION LEFT HIP AND REMOVAL OF TWO CANNULATED SCREW;  Surgeon: Latanya Maudlin, MD;  Location: [facility];  Service: Orthopedics;  Laterality: Left;  . HIP PINNING,CANNULATED  07/16/2011   Procedure: CANNULATED HIP PINNING;  Surgeon: Gearlean Alf, MD;  Location: [facility];  Service: Orthopedics;  Laterality: Left;  . HIP PINNING,CANNULATED Left 04/09/2012   Procedure: CANNULATED HIP PINNING AND HARDWARE REVISION;  Surgeon: Gearlean Alf, MD;  Location: [facility];  Service: Orthopedics;  Laterality: Left;  . I&D EXTREMITY Left 11/16/2014   Procedure:  DEBRIDEMENT OF LEFT FOOT POSSIBLE APPLICATION OF INTEGRIA AND VAC ;  Surgeon: Irene Limbo, MD;  Location: [place];  Service: Plastics;  Laterality: Left;  . I&D EXTREMITY Right 07/01/2018   Procedure: RIGHT ANKLE EX FIX REMOVAL, IRRIGATION AND DEBRIDEMENT AND SHORT LEG CAST;  Surgeon: Leandrew Koyanagi, MD;  Location: [place];  Service: Orthopedics;  Laterality: Right;  . PERIPHERAL VASCULAR CATHETERIZATION N/A 11/15/2014   Procedure: Abdominal  Aortogram;  Surgeon: Conrad Milton, MD;  Location: [place] [facility];  Service: Cardiovascular;  Laterality: N/A;  . PERIPHERAL VASCULAR CATHETERIZATION  11/15/2014   Procedure: Lower Extremity Angiography;  Surgeon: Conrad [place], MD;  Location: [place] [facility];  Service: Cardiovascular;;  . PERIPHERAL VASCULAR CATHETERIZATION Left 11/15/2014   Procedure: Peripheral Vascular Intervention;  Surgeon: Conrad [place], MD;  Location: [place] [facility];  Service: Cardiovascular;  Laterality: Left;  popliteal artery stenting    Family History  Problem Relation Age of Onset  . Diabetes Mother   . Hypertension Mother   . Vision loss Mother   . Heart disease Mother   . Hypertension Father   . Thyroid disease Neg Hx     Social History:  reports that she quit smoking about 2 months ago. Her smoking use included e-cigarettes and cigarettes. She started smoking about 37 years ago. She has a 57.00 pack-year smoking history. She has never used smokeless tobacco. She reports that she does not drink alcohol or use drugs.    Review of Systems   RENAL dysfunction: Variable, currently on Jardiance but no Lasix or other diuretics  Lab Results  Component Value Date   CREATININE 1.18 (H) 07/03/2018   CREATININE 1.43 (H) 07/02/2018   CREATININE 1.01 (H) 07/01/2018  Lipid history: Has been treated with  Fenofibrate and Crestor by PCP LDL is below 100    Lab Results  Component Value Date   CHOL 161 01/21/2018   HDL 47.60 01/21/2018   LDLCALC 147 (H) 05/21/2017   LDLDIRECT 91.0 01/21/2018   TRIG 234.0 (H) 01/21/2018   CHOLHDL 3 01/21/2018           She takes Lyrica for phantom pain of her leg  Blood pressure has been variable, not on antihypertensives or ACE inhibitor   BP Readings from Last 3 Encounters:  07/04/18 (!) 148/62  06/23/18 118/67  03/11/18 130/68    Take Zoloft for depression   Physical Examination:  Ht _0  (1.778 m)   LMP 11/16/2010   BMI 35.15 kg/m    Her face appears somewhat puffy    ASSESSMENT:  Diabetes type 2, uncontrolled with obesity  See history of present illness for detailed discussion of current diabetes management, blood sugar patterns and problems identified   A1c was last 8.3 in July  Even though her blood sugars were significantly high when she was in the nursing home now with decreased food intake her blood sugars are frequently getting low She is also not checking blood sugars much at all as discussed above Difficult to get a blood sugar pattern In the last couple of days she does not report overnight hypoglycemia  Renal function needs to be rechecked with her continuing Jardiance    PLAN:    INSULIN changes  Humulin R to be 20 units before each meal instead of 40  Toujeo 30 units twice daily instead of 50 Needs to check blood sugars before each meal and at bedtime and call if getting low   Follow-up in 3 weeks Duration of phone call = 6 minutes  There are no Patient Instructions on file for this visit.    Elayne Snare 08/19/2018, 9:55 AM   Note: This office note was prepared with Dragon voice recognition system technology. Any transcriptional errors that result from this process are unintentional.

## 2018-08-20 ENCOUNTER — Other Ambulatory Visit: Payer: Self-pay | Admitting: Physician Assistant

## 2018-08-26 ENCOUNTER — Telehealth: Payer: Self-pay

## 2018-08-26 MED ORDER — MUPIROCIN 2 % EX OINT: TOPICAL_OINTMENT | CUTANEOUS | 0 refills | Status: DC

## 2018-08-26 NOTE — Telephone Encounter (Signed)
yes

## 2018-08-26 NOTE — Addendum Note (Signed)
Addended byLaurann Montana on: 08/26/2018 01:17 PM   Modules accepted: Orders

## 2018-08-26 NOTE — Telephone Encounter (Signed)
Received fax from [facility] requesting rxrf on [id number]% Ok to rf?

## 2018-08-29 ENCOUNTER — Ambulatory Visit: Payer: Medicare HMO | Admitting: Orthopaedic Surgery

## 2018-08-29 IMAGING — US US RENAL
1 series · 14 of 25 positions shown · non-contrast
Comparison: 05/03/2014 CT

CLINICAL DATA: Renal insufficiency and altered mental status.

EXAM:
RENAL / URINARY TRACT ULTRASOUND COMPLETE

[Series 1: us renal · 0.31mm/px · 14 of 26 slices shown]
[im 1/26]
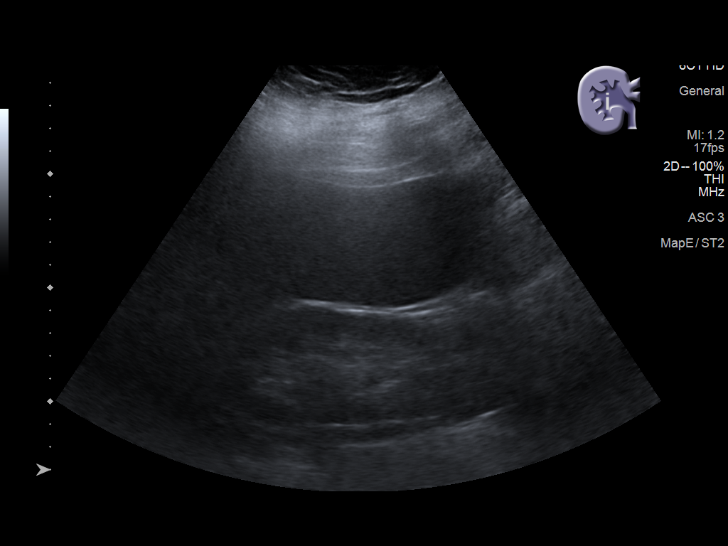
[im 3/26]
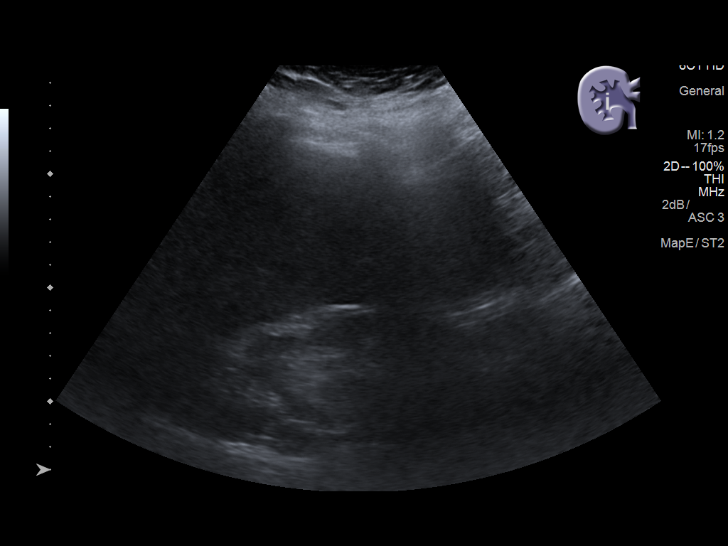
[im 5/26]
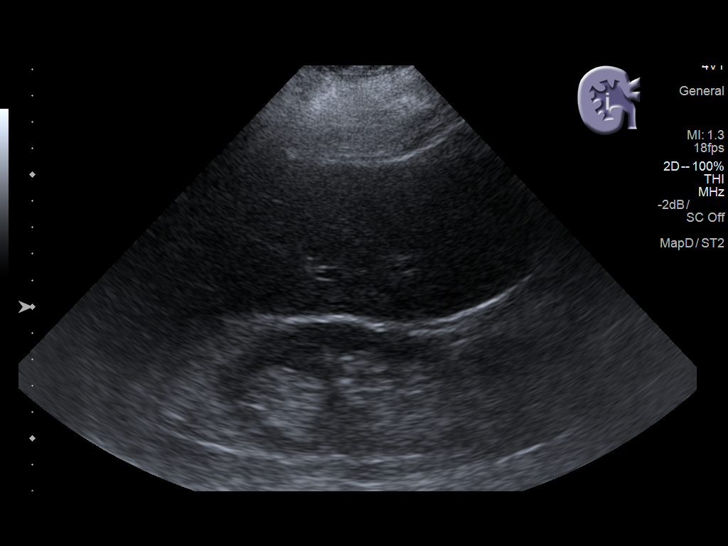
[im 7/26]
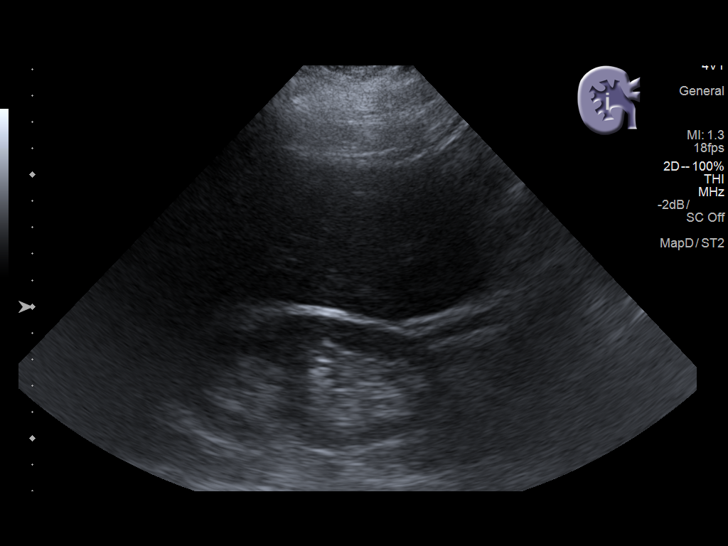
[im 9/26]
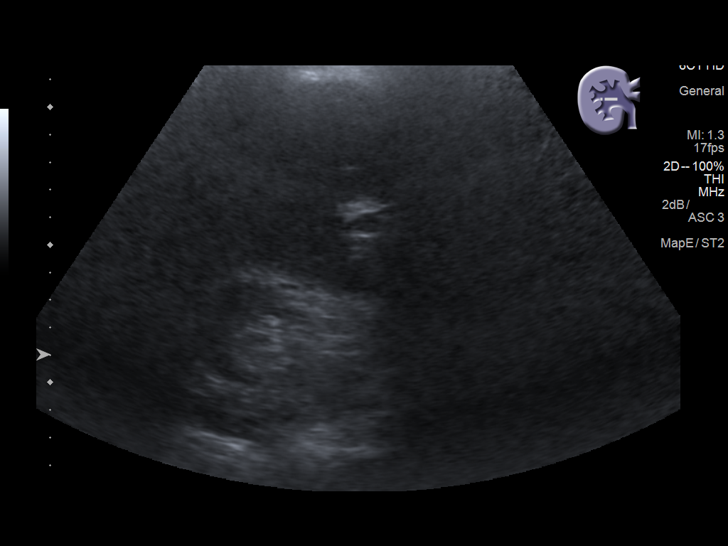
[im 10/26]
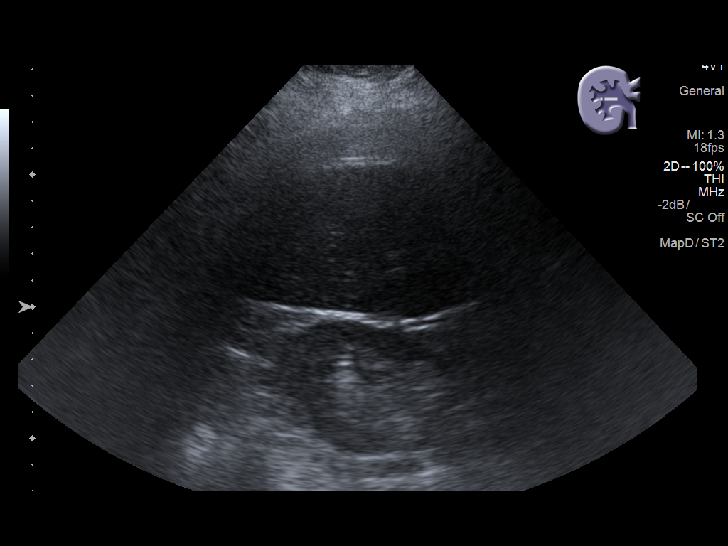
[im 12/26]
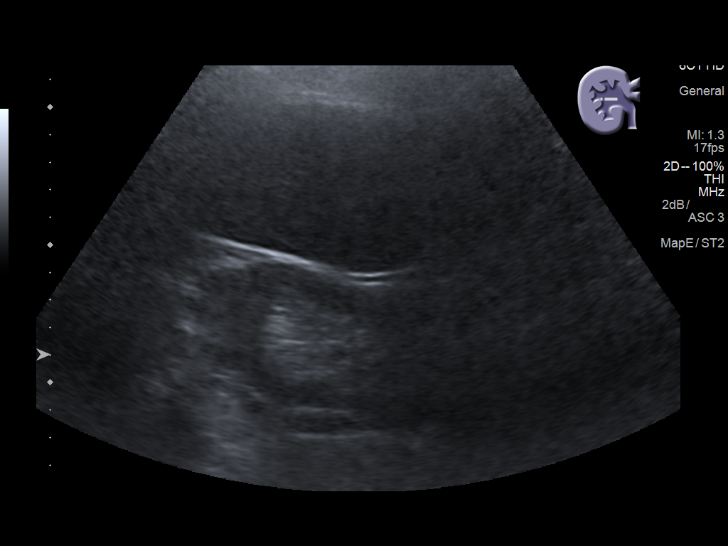
[im 14/26]
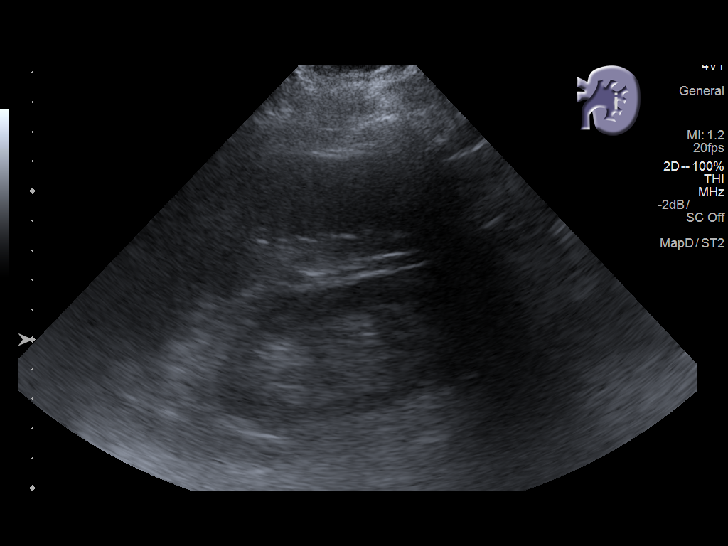
[im 16/26]
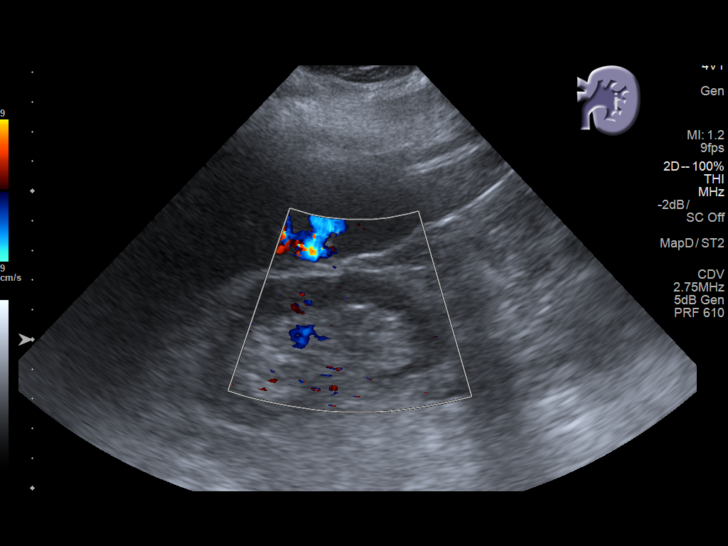
[im 17/26]
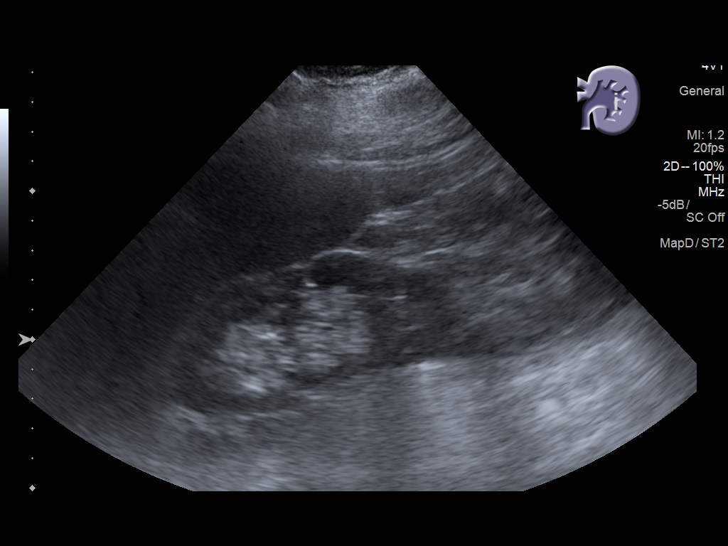
[im 19/26]
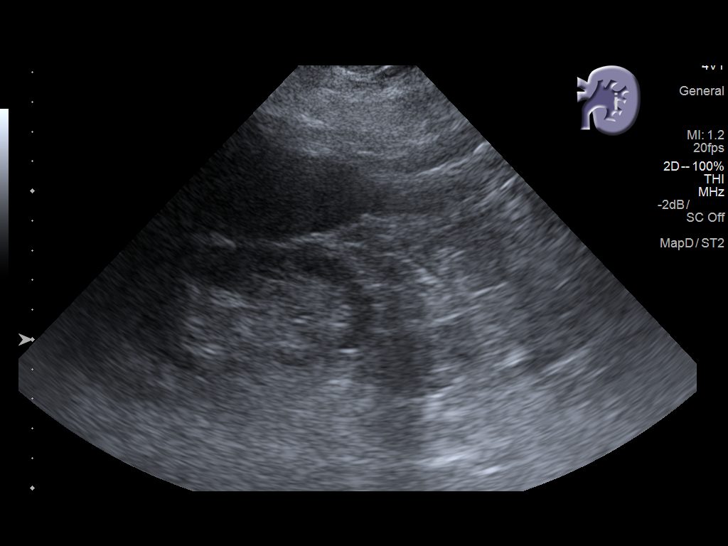
[im 21/26]
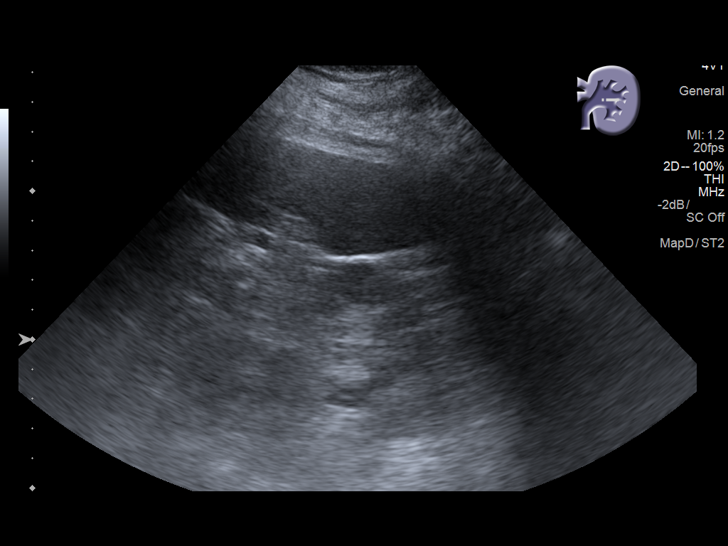
[im 23/26]
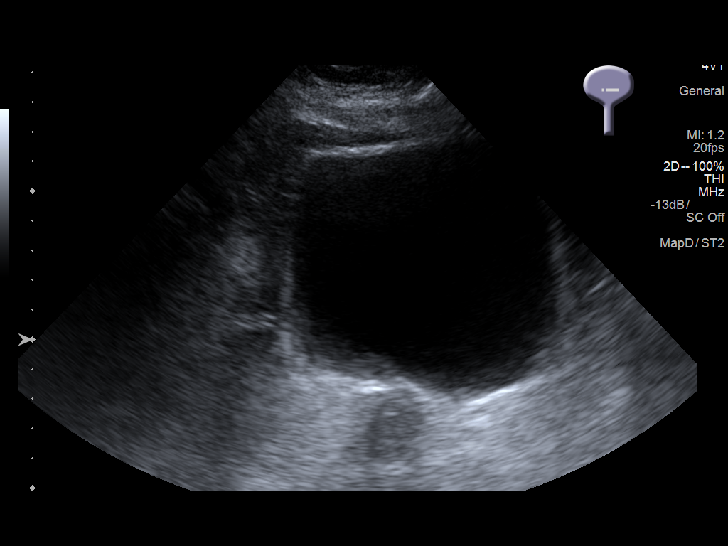
[im 26/26]
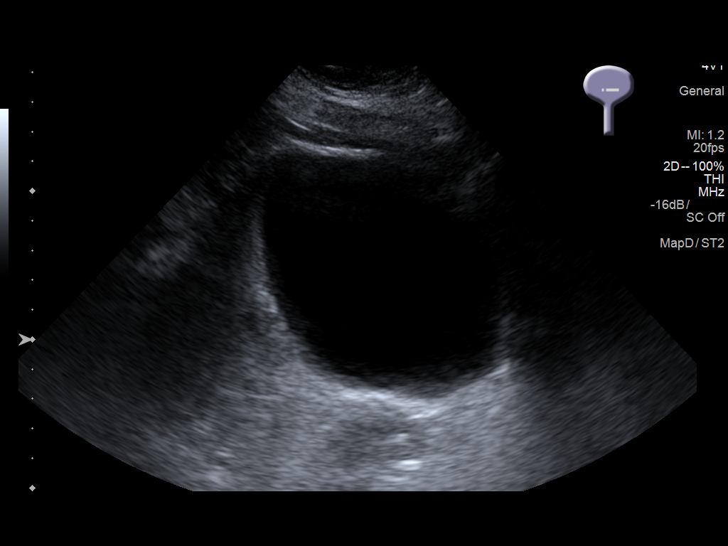

[14 of 25 positions shown; findings below may reference images not displayed]

FINDINGS: Right Kidney:

Length: 11.1 cm. Echogenicity within normal limits. No mass or
hydronephrosis visualized.

Left Kidney:

Length: 10.9 cm. Echogenicity within normal limits. No mass or
hydronephrosis visualized.

Bladder:

Appears normal for degree of bladder distention.
IMPRESSION: Maintained cortical-medullary distinction involving both kidneys
without obstructive uropathy. No sonographic findings to explain the
patient's renal insufficiency.

## 2018-08-31 IMAGING — DX DG CHEST 1V PORT
1 series · 1 of 1 positions shown · non-contrast
Comparison: 06/18/2016

CLINICAL DATA: Shortness of breath

EXAM:
PORTABLE CHEST 1 VIEW

[chest ap]
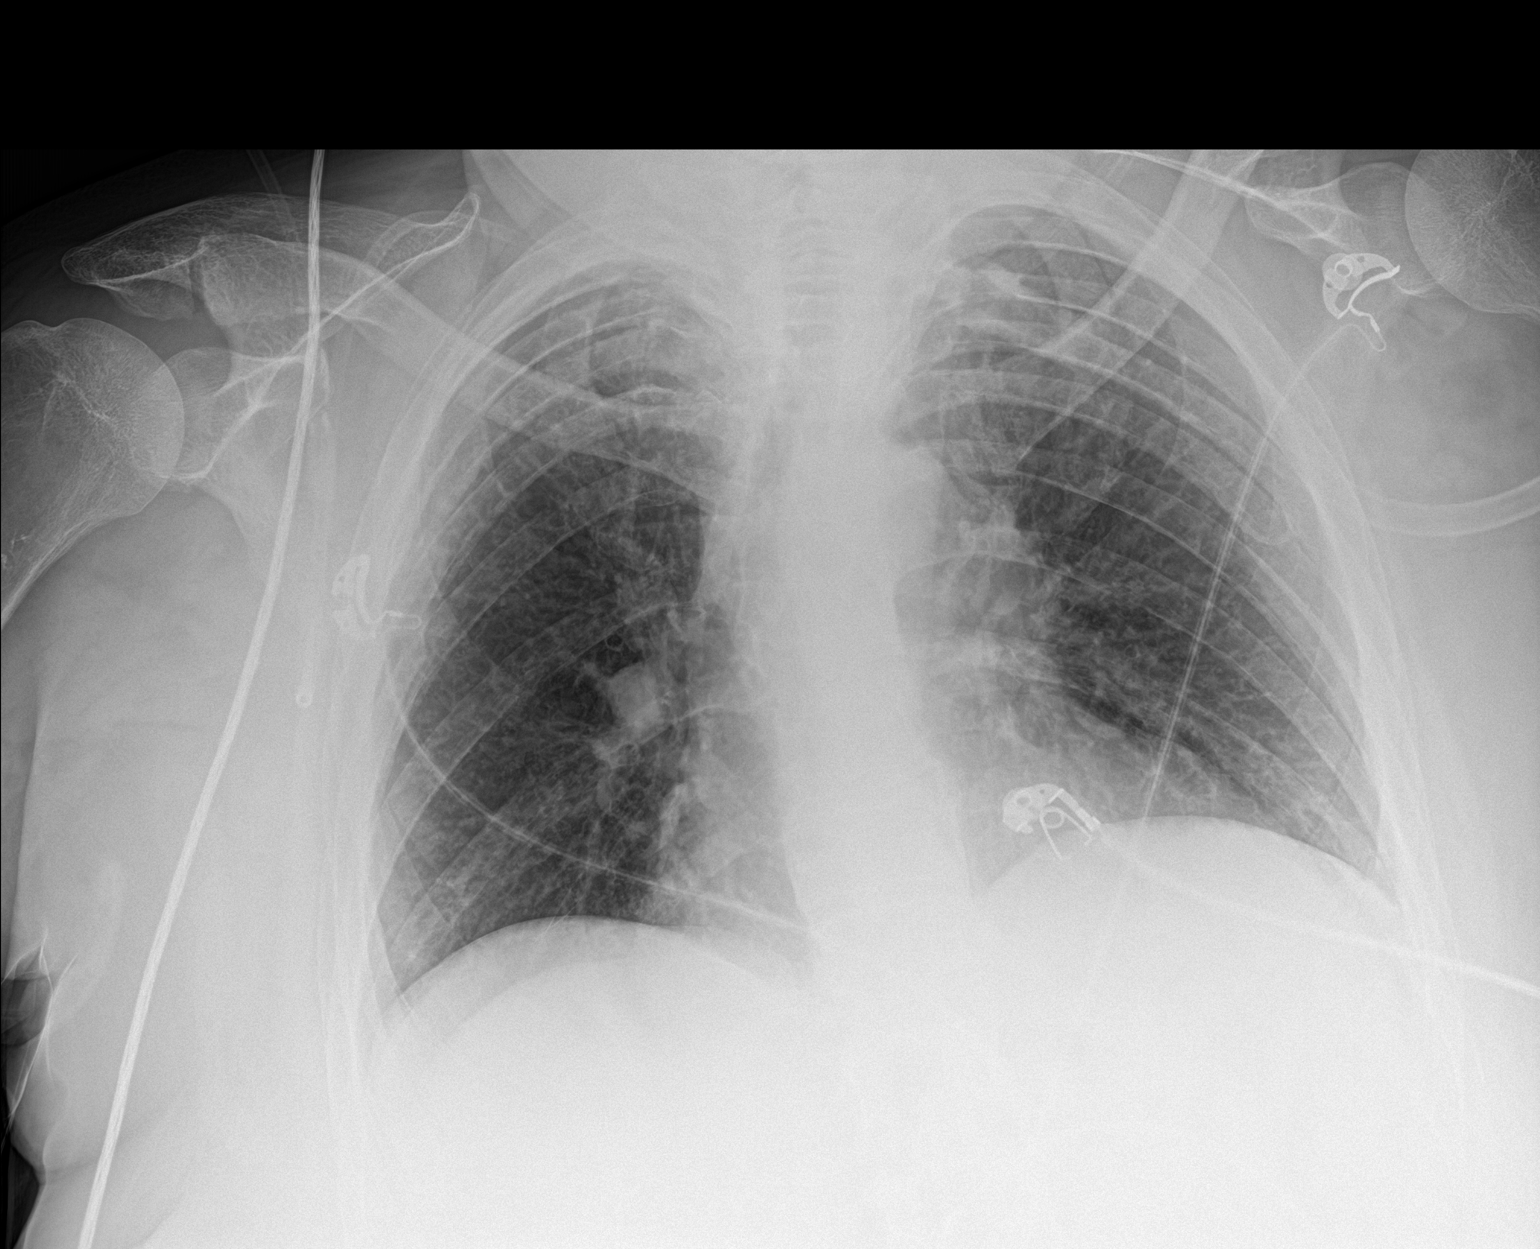

[1 of 1 positions shown; findings below may reference images not displayed]

FINDINGS: Low lung volumes. No focal consolidation or effusion. Borderline
cardiomegaly. No pneumothorax.
IMPRESSION: Low lung volumes with borderline cardiomegaly. No edema or
infiltrate.

## 2018-09-01 ENCOUNTER — Other Ambulatory Visit: Payer: Self-pay

## 2018-09-01 ENCOUNTER — Telehealth: Payer: Self-pay | Admitting: Endocrinology

## 2018-09-01 ENCOUNTER — Telehealth: Payer: Self-pay

## 2018-09-01 MED ORDER — EMPAGLIFLOZIN 10 MG PO TABS: 10.0000 mg | ORAL_TABLET | Freq: Every day | ORAL | 2 refills | Status: DC

## 2018-09-01 NOTE — Telephone Encounter (Signed)
Rx sent 

## 2018-09-01 NOTE — Telephone Encounter (Signed)
Patient is calling for refill of Nexium which she says was changed from Protonix in hospital.  Not on medication list.  Please advise.

## 2018-09-01 NOTE — Telephone Encounter (Signed)
MEDICATION: Jardiance  PHARMACY:  [facility] on [address]  IS THIS A 90 DAY SUPPLY : yes  IS PATIENT OUT OF MEDICATION: no  IF NOT; HOW MUCH IS LEFT: 3 or 4 pills left  LAST APPOINTMENT DATE: @8 /18/2020  NEXT APPOINTMENT DATE:@9 /10/2018  DO WE HAVE YOUR PERMISSION TO LEAVE A DETAILED MESSAGE:  OTHER COMMENTS:    **Let patient know to contact pharmacy at the end of the day to make sure medication is ready. **  ** Please notify patient to allow 48-72 hours to process**  **Encourage patient to contact the pharmacy for refills or they can request refills through [facility]**

## 2018-09-02 ENCOUNTER — Ambulatory Visit (INDEPENDENT_AMBULATORY_CARE_PROVIDER_SITE_OTHER): Payer: Medicare HMO | Admitting: Orthopaedic Surgery

## 2018-09-02 ENCOUNTER — Other Ambulatory Visit: Payer: Self-pay | Admitting: Infectious Diseases

## 2018-09-02 ENCOUNTER — Ambulatory Visit (INDEPENDENT_AMBULATORY_CARE_PROVIDER_SITE_OTHER): Payer: Medicare HMO

## 2018-09-02 DIAGNOSIS — S82891D Other fracture of right lower leg, subsequent encounter for closed fracture with routine healing: Secondary | ICD-10-CM

## 2018-09-02 DIAGNOSIS — K219 Gastro-esophageal reflux disease without esophagitis: Secondary | ICD-10-CM

## 2018-09-02 DIAGNOSIS — IMO0002 Reserved for concepts with insufficient information to code with codable children: Secondary | ICD-10-CM

## 2018-09-02 DIAGNOSIS — S82891S Other fracture of right lower leg, sequela: Secondary | ICD-10-CM

## 2018-09-02 DIAGNOSIS — E1165 Type 2 diabetes mellitus with hyperglycemia: Secondary | ICD-10-CM

## 2018-09-02 MED ORDER — ESOMEPRAZOLE MAGNESIUM 20 MG PO PACK: 20.0000 mg | PACK | Freq: Every day | ORAL | 12 refills | Status: DC

## 2018-09-02 NOTE — Progress Notes (Signed)
Post-Op Visit Note   Patient: [name]           Date of Birth: 1965/08/18           MRN: [id number] Visit Date: 09/02/2018 PCP: [name], PA-C   Assessment & Plan:  Chief Complaint:  Chief Complaint  Patient presents with  . Right Ankle - Follow-up   Visit Diagnoses:  1. Closed fracture of right ankle, sequela     Plan: Shweta is 3 months status post left ankle external fixation and subsequent removal and casting.  She has been doing well with weightbearing for transfers.  She is currently receiving physical therapy.  Overall her ankle is doing okay.  Her swelling is still moderate with redness consistent with dependent rubor.  She has decent range of motion of the ankle and subtalar joint without pain.  I would like her to continue with physical therapy for ankle strengthening.  Recheck in 6 weeks with three-view x-rays of the right ankle.  Follow-Up Instructions: Return in about 6 weeks (around 10/14/2018).   Orders:  Orders Placed This Encounter  Procedures  . XR Ankle Complete Right   No orders of the defined types were placed in this encounter.   Imaging: Xr Ankle Complete Right  Result Date: 09/02/2018 Stable ankle mortise.  Disuse osteopenia.  Fibula fracture appears healed.  Questionable nonunion of medial malleolus.  Posterior malleolus appears to be healed.   PMFS History: Patient Active Problem List   Diagnosis Date Noted  . Closed right ankle fracture 07/02/2018  . Anemia of chronic disease 07/02/2018  . Displaced bimalleolar fracture of right ankle, closed, initial encounter 06/02/2018  . Avascular necrosis of bone of left hip ([place]) 04/03/2018  . Mucopurulent chronic bronchitis ([place]) 03/11/2018  . Allergic rhinitis 03/11/2018  . Anxiety and depression 12/29/2017  . Acute respiratory failure with hypoxia ([place]) 12/28/2017  . Syncope 08/29/2017  . DKA (diabetic ketoacidoses) ([place]) 03/02/2017  . COPD exacerbation ([place]) 02/23/2017  .  Acute on chronic respiratory failure with hypoxia and hypercapnia ([place]) 08/19/2016  . Leukocytosis 08/19/2016  . CKD (chronic kidney disease), stage III ([place]) 08/19/2016  . Altered mental status 08/19/2016  . Asthma exacerbation 06/18/2016  . Insomnia 06/12/2016  . Difficult intravenous access   . Acute on chronic respiratory failure with hypoxia ([place]) 05/19/2016  . NSTEMI (non-ST elevated myocardial infarction) ([place]) 05/19/2016  . Acute metabolic encephalopathy 49/70/2637  . Hyperlipidemia   . Class 3 obesity due to excess calories with serious comorbidity and body mass index (BMI) of 40.0 to 44.9 in adult   . Obesity hypoventilation syndrome ([place])   . Diastolic dysfunction 85/88/5027  . Diabetic neuropathy ([place]) 11/10/2014  . Fracture of distal femur ([place]) 07/08/2014  . Osteoporosis 07/08/2014  . Femur fracture, left ([place]) 07/08/2014  . Colles' fracture of left radius   . Acute kidney injury superimposed on CKD ([place]) 05/03/2014  . Left hip pain 05/03/2014  . Perimenopausal symptoms 02/13/2012  . Hyponatremia 12/29/2011  . Asthma exacerbation attacks 12/28/2011  . Bronchitis 12/28/2011  . Repeated falls 07/30/2011  . Hip fracture, left ([place]) 07/16/2011  . Femoral neck fracture ([place]) 07/16/2011  . Tobacco abuse 04/14/2010  . Hyperlipidemia associated with type 2 diabetes mellitus ([place]) 12/23/2008  . COPD (chronic obstructive pulmonary disease) ([place]) 11/29/2008  . THRUSH 05/20/2008  . Human immunodeficiency virus (HIV) disease ([place]) 04/19/2008  . Diabetes mellitus type 2 with complications, uncontrolled ([place]) 04/19/2008  . Mood disorder ([place]) 04/19/2008  . Hereditary  and idiopathic peripheral neuropathy 04/19/2008  . Hypertension associated with diabetes ([place]) 04/19/2008   Past Medical History:  Diagnosis Date  . Acute metabolic encephalopathy 08/28/32  . Acute on chronic respiratory failure with hypoxia ([place]) 05/19/2016  . Anxiety   . ARF (acute renal failure) ([place])  05/03/2014  . Arthritis    LEFT HIP  . Asthma    HOSPITALIZED WITH EXCERBATION OF ASTHMA - AND BRONCHITIS AND THE FLU DEC 2013  . Asthma exacerbation attacks 12/28/2011  . Bronchitis 12/28/2011  . CAP (community acquired pneumonia) 07/25/2012  . Chronic kidney disease (CKD), stage IV (severe) ([place]) 11/10/2014  . Class 3 obesity due to excess calories with serious comorbidity and body mass index (BMI) of 40.0 to 44.9 in adult   . Colles' fracture of left radius   . COPD (chronic obstructive pulmonary disease) ([place])   . Depression   . Diabetes mellitus    ON INSULIN AND ORAL MEDICATIONS  . Diabetes mellitus type 2 with complications, uncontrolled ([place]) 04/19/2008   Qualifier: Diagnosis of  By: Tomma Lightning MD, Claiborne Billings     . Diabetic foot infection ([place]) 11/09/2014  . Diabetic ketoacidosis without coma associated with type 2 diabetes mellitus ([place])   . Diabetic neuropathy ([place]) 11/10/2014  . Diarrhea 08/30/2008   Qualifier: Diagnosis of  By: Tomma Lightning MD, Claiborne Billings    . Diastolic dysfunction 91/07/9148  . Difficult intravenous access   . DKA, type 2 ([place]) 04/04/2016  . Dyslipidemia 12/23/2008   Qualifier: Diagnosis of  By: Tomma Lightning MD, Claiborne Billings    . Essential hypertension 04/19/2008   Qualifier: Diagnosis of  By: Tomma Lightning MD, Claiborne Billings    . FATIGUE 07/12/2008   Qualifier: Diagnosis of  By: Tomma Lightning MD, Claiborne Billings    . Femoral neck fracture ([place]) 07/16/2011  . Femur fracture, left ([place]) 07/08/2014  . Fever   . Fracture of distal femur ([place]) 07/08/2014  . GERD (gastroesophageal reflux disease)   . Hereditary and idiopathic peripheral neuropathy 04/19/2008   Qualifier: Diagnosis of  By: Tomma Lightning MD, Claiborne Billings    . Hip fracture, left ([place]) 07/16/2011  . Hip pain   . HIV infection ([place]) 2000  . Human immunodeficiency virus (HIV) disease ([place]) 04/19/2008   [id number] +   . Hyperlipidemia   . Hypertension   . Hyponatremia 12/29/2011  . Influenza A 12/29/2011  . Influenza B   . Insomnia 06/12/2016  . Left hip pain 05/03/2014  . Mood  disorder ([place]) 04/19/2008   Qualifier: Diagnosis of  By: Tomma Lightning MD, Claiborne Billings    . Neuropathy    NEUROPATHY HANDS AND FEET  . NSTEMI (non-ST elevated myocardial infarction) ([place]) 05/19/2016  . Obesity hypoventilation syndrome ([place])   . Osteoporosis 07/08/2014  . Pain    SEVERE PAIN LEFT HIP - HX OF LEFT HIP PINNING JULY 2013  . Perimenopausal symptoms 02/13/2012   LMP around 2011. On estrace and provera since around 2012 for hot flashes, mood swings. Estrace 2 mg daily, Provera 2.5 mg for 5 days each month.   . Repeated falls 07/30/2011  . Sepsis ([place]) 04/04/2016  . Shortness of breath    ALLERGIES ARE "ACTING UP"  . SOB (shortness of breath)   . THRUSH 05/20/2008   Qualifier: Diagnosis of  By: Tomma Lightning MD, Claiborne Billings      . Tobacco use disorder 04/14/2010    Family History  Problem Relation Age of Onset  . Diabetes Mother   . Hypertension Mother   . Vision loss Mother   . Heart disease  Mother   . Hypertension Father   . Thyroid disease Neg Hx     Past Surgical History:  Procedure Laterality Date  . AMPUTATION Left 11/18/2014   Procedure: LEFT BELOW KNEE AMPUTATION;  Surgeon: Leandrew Koyanagi, MD;  Location: [facility];  Service: Orthopedics;  Laterality: Left;  . CAST APPLICATION Left 01/01/1550   Procedure: CAST APPLICATION (FIBERGLASS);  Surgeon: Latanya Maudlin, MD;  Location: [facility];  Service: Orthopedics;  Laterality: Left;  CLOSED REDUCTION OF LEFT COLLES FRACTURE WITH SHORT ARM CAST  . EXTERNAL FIXATION LEG Right 06/02/2018   Procedure: EXTERNAL FIXATION LEG;  Surgeon: Leandrew Koyanagi, MD;  Location: [facility];  Service: Orthopedics;  Laterality: Right;  . EXTERNAL FIXATION REMOVAL Right 07/01/2018   Procedure: REMOVAL EXTERNAL FIXATION LEG;  Surgeon: Leandrew Koyanagi, MD;  Location: [facility];  Service: Orthopedics;  Laterality: Right;  . HARDWARE REMOVAL Left 05/05/2014   Procedure: HARDWARE REMOVAL LEFT HIP;  Surgeon: Latanya Maudlin, MD;  [facility]: [facility];  Service: Orthopedics;  Laterality: Left;  REMOVAL BIOMET  6.5-8.0 CANNULATED SCREW  . HIP ARTHROPLASTY Left 05/05/2014   Procedure: ARTHROPLASTY OPEN REDUCTION INTERNAL FIXATION LEFT HIP AND REMOVAL OF TWO CANNULATED SCREW;  Surgeon: Latanya Maudlin, MD;  Location: [facility];  Service: Orthopedics;  Laterality: Left;  . HIP PINNING,CANNULATED  07/16/2011   Procedure: CANNULATED HIP PINNING;  Surgeon: Gearlean Alf, MD;  Location: [facility];  Service: Orthopedics;  Laterality: Left;  . HIP PINNING,CANNULATED Left 04/09/2012   Procedure: CANNULATED HIP PINNING AND HARDWARE REVISION;  Surgeon: Gearlean Alf, MD;  Location: [facility];  Service: Orthopedics;  Laterality: Left;  . I&D EXTREMITY Left 11/16/2014   Procedure:  DEBRIDEMENT OF LEFT FOOT POSSIBLE APPLICATION OF INTEGRIA AND VAC ;  Surgeon: Irene Limbo, MD;  Location: [facility];  Service: Plastics;  Laterality: Left;  . I&D EXTREMITY Right 07/01/2018   Procedure: RIGHT ANKLE EX FIX REMOVAL, IRRIGATION AND DEBRIDEMENT AND SHORT LEG CAST;  Surgeon: Leandrew Koyanagi, MD;  Location: [facility];  Service: Orthopedics;  Laterality: Right;  . PERIPHERAL VASCULAR CATHETERIZATION N/A 11/15/2014   Procedure: Abdominal Aortogram;  Surgeon: Conrad Gloucester, MD;  Location: [facility];  Service: Cardiovascular;  Laterality: N/A;  . PERIPHERAL VASCULAR CATHETERIZATION  11/15/2014   Procedure: Lower Extremity Angiography;  Surgeon: Conrad Lewiston, MD;  Location: [facility];  Service: Cardiovascular;;  . PERIPHERAL VASCULAR CATHETERIZATION Left 11/15/2014   Procedure: Peripheral Vascular Intervention;  Surgeon: Conrad Pinch, MD;  Location: [facility];  Service: Cardiovascular;  Laterality: Left;  popliteal artery stenting   Social History   Occupational History  . Not on file  Tobacco Use  . Smoking status: Former Smoker    Packs/day: 1.50    Years: 38.00    Pack years: 57.00    Types: E-cigarettes, Cigarettes    Start date: 03/10/1981    Quit date: 06/01/2018    Years since quitting: 0.2  . Smokeless  tobacco: Never Used  . Tobacco comment: 3/4 of a pack per day 03/11/18  Substance and Sexual Activity  . Alcohol use: No    Alcohol/week: 0.0 standard drinks    Comment: no drink since 2008  . Drug use: No    Comment: hx of crack/cocaine, clean 8 years  . Sexual activity: Not Currently    Partners: Male

## 2018-09-02 NOTE — Telephone Encounter (Signed)
Done. thanks

## 2018-09-09 ENCOUNTER — Telehealth: Payer: Self-pay | Admitting: Orthopaedic Surgery

## 2018-09-09 NOTE — Telephone Encounter (Signed)
[name], OT, with [facility] left a voicemail message requesting an order for a piece of equipment.  She would like someone to give her a call back.  CB#[phone].  Thank you.

## 2018-09-10 ENCOUNTER — Telehealth: Payer: Self-pay | Admitting: Orthopaedic Surgery

## 2018-09-10 NOTE — Telephone Encounter (Signed)
Faxed order for commode and gave verbal order for increase in frequency

## 2018-09-10 NOTE — Telephone Encounter (Signed)
IC and they are requesting an order drop arm bedside commode sent to [facility]. I faxed order for this.

## 2018-09-10 NOTE — Telephone Encounter (Signed)
Thank you :)

## 2018-09-10 NOTE — Telephone Encounter (Signed)
Received call from [facility]-(OT) needing to increase frequency to one visit a week and also need Rx for a drop arm bedside commode. Please fax Rx to [facility]. The number to contact [facility] is [phone]

## 2018-09-11 ENCOUNTER — Telehealth: Payer: Self-pay | Admitting: Endocrinology

## 2018-09-11 ENCOUNTER — Other Ambulatory Visit: Payer: Self-pay

## 2018-09-11 ENCOUNTER — Ambulatory Visit (INDEPENDENT_AMBULATORY_CARE_PROVIDER_SITE_OTHER): Payer: Medicare HMO | Admitting: Endocrinology

## 2018-09-11 ENCOUNTER — Encounter: Payer: Self-pay | Admitting: Endocrinology

## 2018-09-11 DIAGNOSIS — Z794 Long term (current) use of insulin: Secondary | ICD-10-CM

## 2018-09-11 DIAGNOSIS — E1165 Type 2 diabetes mellitus with hyperglycemia: Secondary | ICD-10-CM

## 2018-09-11 NOTE — Telephone Encounter (Signed)
Patient states to give this info to Dr.Kumar:  [facility]  [phone]

## 2018-09-11 NOTE — Progress Notes (Signed)
Patient ID: [name], female   DOB: [birth year], 53 y.o.   MRN: [id number]           Reason for Appointment: Follow-up  for Type 2 Diabetes   Today's office visit was provided via telemedicine using audio technique Video connection was interrupted during the visit and was completed with telephone conversation . Consent for the patient has been obtained . Location of the patient: Home . Location of the provider: Office Only the patient and myself were participating in the encounter   History of Present Illness:          Date of diagnosis of type 2 diabetes mellitus: 2000       Background history:     The patient was apparently diagnosed incidentally with her diabetes 16 years ago 16 years ago, was asymptomatic. She took metformin and possibly other oral hypoglycemic drugs but subsequently went on insulin in 2005 She was taking various insulin regimens in the past  She was being treated by an endocrinologist some time ago and he switched her To Toujeo and also tried her on Byetta.  She thinks that she had better blood sugars with Byetta but she did not follow-up  Novolog was switched to U-500 insulin in 3/17   Recent history:   INSULIN regimen is:  Toujeo 50 units bid  Humulin R U-500: 40 units before meals   Non-insulin hypoglycemic drugs the patient is taking are: Jardiance 10 mg    Her A1c has been persistently high and last 8.3, no recent labs available   Current management, blood sugar patterns and problems identified:  Her blood sugars appear to be fairly good recently  On her last visit her blood sugars were tending to be significantly low and her insulin doses were reduced including the Humulin R was reduced by 50% down to 20 units  However she says that after a few days her blood sugars started going up progressively and she is back on her original doses of Toujeo and Humulin R.  However she has been forgetting to check her blood sugars and only checks a  few readings before suppertime  Not clear if she is having any high readings after meals  She did report a low sugar episode with symptoms about 2 weeks ago but none since then  .  Last renal function was near normal, this was done in early July    Side effects from medications have been: None  Compliance with the medical regimen:  fair  Glucose monitoring:  done with unknown meter, 3 times a day     PRE-MEAL Fasting Lunch Dinner Bedtime Overall  Glucose range:  115, 189   86-160    Mean/median:        Previous readings:  PRE-MEAL Fasting Lunch Dinner Bedtime Overall  Glucose range:  103  47-170  47-212    Mean/median:     ?     Self-care:   Typical meal intake: Breakfast is mostly a yogurt, fruit; or egg/toast  First meal at about 11 AM, has mixed meal at dinnertime 6 pm, usually 2 meals a day Has variable amounts of snacks.              Dietician visit, most recent: At [facility]               Exercise:  none  Weight history:  Wt Readings from Last 3 Encounters:  08/12/18 245 lb (111.1 kg)  07/01/18 234 lb (106.1  kg)  06/01/18 234 lb (106.1 kg)    Glycemic control: Baseline A1c is 11 in 8/16   Lab Results  Component Value Date   HGBA1C 8.3 (H) 07/02/2018   HGBA1C 8.1 07/02/2018   HGBA1C 8.6 (H) 01/21/2018   Lab Results  Component Value Date   MICROALBUR 1.2 03/05/2016   LDLCALC 147 (H) 05/21/2017   CREATININE 1.18 (H) 07/03/2018     Other active problems: See review of systems    Allergies as of 09/11/2018      Reactions   Cortizone-10 [hydrocortisone] Rash   Per patient, given local injection at knee and developed rash at local site.       Medication List       Accurate as of September 11, 2018 10:28 AM. If you have any questions, ask your nurse or doctor.        Accu-Chek Aviva device Use to test blood sugar daily   Accu-Chek Aviva test strip Generic drug: glucose blood Use as instructed to check blood sugar 2 times daily.    acetaminophen 325 MG tablet Commonly known as: TYLENOL Take 1-2 tablets (325-650 mg total) by mouth every 6 (six) hours as needed for mild pain (pain score 1-3 or temp > 100.5).   albuterol 108 (90 Base) MCG/ACT inhaler Commonly known as: VENTOLIN HFA INHALE 2 PUFFS INTO THE LUNGS EVERY 6 HOURS AS NEEDED FOR WHEEZING OR SHORTNESS OF BREATH   ALPRAZolam 0.5 MG tablet Commonly known as: XANAX Take 0.5 mg by mouth 3 (three) times daily.   bictegravir-emtricitabine-tenofovir AF 50-200-25 MG Tabs tablet Commonly known as: BIKTARVY Take 1 tablet by mouth daily.   empagliflozin 10 MG Tabs tablet Commonly known as: JARDIANCE Take 10 mg by mouth daily.   esomeprazole 20 MG packet Commonly known as: NexIUM Take 20 mg by mouth daily before breakfast.   fluticasone 50 MCG/ACT nasal spray Commonly known as: FLONASE Place 1 spray into both nostrils daily.   Fluticasone-Umeclidin-Vilant 100-62.5-25 MCG/INH Aepb Commonly known as: Trelegy Ellipta Inhale 1 puff into the lungs daily.   HumuLIN R U-500 KwikPen 500 UNIT/ML kwikpen Generic drug: insulin regular human CONCENTRATED Inject into the skin. Inject 40 units under the skin 2-3 times daily before meals.   HYDROcodone-acetaminophen 5-325 MG tablet Commonly known as: NORCO/VICODIN Take 2 tablets by mouth 2 (two) times a day. Take 2 tablets by mouth 2 times daily.   ipratropium-albuterol 0.5-2.5 (3) MG/3ML Soln Commonly known as: DUONEB Take 3 mLs by nebulization every 6 (six) hours. What changed:   when to take this  reasons to take this   magnesium oxide 400 MG tablet Commonly known as: MAG-OX Take 400 mg by mouth daily.   methocarbamol 500 MG tablet Commonly known as: ROBAXIN Take 1 tablet (500 mg total) by mouth 3 (three) times daily.   mupirocin ointment 2 % Commonly known as: Bactroban Apply to affected area   pregabalin 225 MG capsule Commonly known as: LYRICA Take 225 mg by mouth 2 (two) times daily.    rosuvastatin 20 MG tablet Commonly known as: CRESTOR Take 1 tablet (20 mg total) by mouth daily. Reported on 05/02/2015   sertraline 100 MG tablet Commonly known as: ZOLOFT Take 100 mg by mouth daily. (Give with 44m tablet for a total of 1525m   sertraline 50 MG tablet Commonly known as: ZOLOFT Take 50 mg by mouth daily. (Give with 1007mablet for a total of 150m33m Toujeo SoloStar 300 UNIT/ML Sopn Generic drug: Insulin Glargine (  1 Unit Dial) Inject 50 Units into the skin 2 (two) times a day. Inject 50 units under the skin twice daily.       Allergies:  Allergies  Allergen Reactions  . Cortizone-10 [Hydrocortisone] Rash    Per patient, given local injection at knee and developed rash at local site.     Past Medical History:  Diagnosis Date  . Acute metabolic encephalopathy 0/78/6754  . Acute on chronic respiratory failure with hypoxia ([place]) 05/19/2016  . Anxiety   . ARF (acute renal failure) ([place]) 05/03/2014  . Arthritis    LEFT HIP  . Asthma    HOSPITALIZED WITH EXCERBATION OF ASTHMA - AND BRONCHITIS AND THE FLU DEC 2013  . Asthma exacerbation attacks 12/28/2011  . Bronchitis 12/28/2011  . CAP (community acquired pneumonia) 07/25/2012  . Chronic kidney disease (CKD), stage IV (severe) ([place]) 11/10/2014  . Class 3 obesity due to excess calories with serious comorbidity and body mass index (BMI) of 40.0 to 44.9 in adult   . Colles' fracture of left radius   . COPD (chronic obstructive pulmonary disease) ([place])   . Depression   . Diabetes mellitus    ON INSULIN AND ORAL MEDICATIONS  . Diabetes mellitus type 2 with complications, uncontrolled ([place]) 04/19/2008   Qualifier: Diagnosis of  By: Tomma Lightning MD, Claiborne Billings     . Diabetic foot infection ([place]) 11/09/2014  . Diabetic ketoacidosis without coma associated with type 2 diabetes mellitus ([place])   . Diabetic neuropathy ([place]) 11/10/2014  . Diarrhea 08/30/2008   Qualifier: Diagnosis of  By: Tomma Lightning MD, Claiborne Billings    . Diastolic dysfunction  49/02/98  . Difficult intravenous access   . DKA, type 2 ([place]) 04/04/2016  . Dyslipidemia 12/23/2008   Qualifier: Diagnosis of  By: Tomma Lightning MD, Claiborne Billings    . Essential hypertension 04/19/2008   Qualifier: Diagnosis of  By: Tomma Lightning MD, Claiborne Billings    . FATIGUE 07/12/2008   Qualifier: Diagnosis of  By: Tomma Lightning MD, Claiborne Billings    . Femoral neck fracture ([place]) 07/16/2011  . Femur fracture, left ([place]) 07/08/2014  . Fever   . Fracture of distal femur ([place]) 07/08/2014  . GERD (gastroesophageal reflux disease)   . Hereditary and idiopathic peripheral neuropathy 04/19/2008   Qualifier: Diagnosis of  By: Tomma Lightning MD, Claiborne Billings    . Hip fracture, left ([place]) 07/16/2011  . Hip pain   . HIV infection ([place]) 2000  . Human immunodeficiency virus (HIV) disease ([place]) 04/19/2008   [id number] +   . Hyperlipidemia   . Hypertension   . Hyponatremia 12/29/2011  . Influenza A 12/29/2011  . Influenza B   . Insomnia 06/12/2016  . Left hip pain 05/03/2014  . Mood disorder ([place]) 04/19/2008   Qualifier: Diagnosis of  By: Tomma Lightning MD, Claiborne Billings    . Neuropathy    NEUROPATHY HANDS AND FEET  . NSTEMI (non-ST elevated myocardial infarction) ([place]) 05/19/2016  . Obesity hypoventilation syndrome ([place])   . Osteoporosis 07/08/2014  . Pain    SEVERE PAIN LEFT HIP - HX OF LEFT HIP PINNING JULY 2013  . Perimenopausal symptoms 02/13/2012   LMP around 2011. On estrace and provera since around 2012 for hot flashes, mood swings. Estrace 2 mg daily, Provera 2.5 mg for 5 days each month.   . Repeated falls 07/30/2011  . Sepsis ([place]) 04/04/2016  . Shortness of breath    ALLERGIES ARE "ACTING UP"  . SOB (shortness of breath)   . THRUSH 05/20/2008   Qualifier: Diagnosis of  By: Tomma Lightning  MD, Claiborne Billings      . Tobacco use disorder 04/14/2010    Past Surgical History:  Procedure Laterality Date  . AMPUTATION Left 11/18/2014   Procedure: LEFT BELOW KNEE AMPUTATION;  Surgeon: Leandrew Koyanagi, MD;  Location: [place];  Service: Orthopedics;  Laterality: Left;  . CAST APPLICATION  Left 08/08/5025   Procedure: CAST APPLICATION (FIBERGLASS);  Surgeon: Latanya Maudlin, MD;  Location: [facility];  Service: Orthopedics;  Laterality: Left;  CLOSED REDUCTION OF LEFT COLLES FRACTURE WITH SHORT ARM CAST  . EXTERNAL FIXATION LEG Right 06/02/2018   Procedure: EXTERNAL FIXATION LEG;  Surgeon: Leandrew Koyanagi, MD;  Location: [facility];  Service: Orthopedics;  Laterality: Right;  . EXTERNAL FIXATION REMOVAL Right 07/01/2018   Procedure: REMOVAL EXTERNAL FIXATION LEG;  Surgeon: Leandrew Koyanagi, MD;  Location: [place];  Service: Orthopedics;  Laterality: Right;  . HARDWARE REMOVAL Left 05/05/2014   Procedure: HARDWARE REMOVAL LEFT HIP;  Surgeon: Latanya Maudlin, MD;  Location: [facility];  Service: Orthopedics;  Laterality: Left;  REMOVAL BIOMET 6.5-8.0 CANNULATED SCREW  . HIP ARTHROPLASTY Left 05/05/2014   Procedure: ARTHROPLASTY OPEN REDUCTION INTERNAL FIXATION LEFT HIP AND REMOVAL OF TWO CANNULATED SCREW;  Surgeon: Latanya Maudlin, MD;  Location: [facility];  Service: Orthopedics;  Laterality: Left;  . HIP PINNING,CANNULATED  07/16/2011   Procedure: CANNULATED HIP PINNING;  Surgeon: Gearlean Alf, MD;  Location: [facility];  Service: Orthopedics;  Laterality: Left;  . HIP PINNING,CANNULATED Left 04/09/2012   Procedure: CANNULATED HIP PINNING AND HARDWARE REVISION;  Surgeon: Gearlean Alf, MD;  Location: [facility];  Service: Orthopedics;  Laterality: Left;  . I&D EXTREMITY Left 11/16/2014   Procedure:  DEBRIDEMENT OF LEFT FOOT POSSIBLE APPLICATION OF INTEGRIA AND VAC ;  Surgeon: Irene Limbo, MD;  Location: [place] [facility];  Service: Plastics;  Laterality: Left;  . I&D EXTREMITY Right 07/01/2018   Procedure: RIGHT ANKLE EX FIX REMOVAL, IRRIGATION AND DEBRIDEMENT AND SHORT LEG CAST;  Surgeon: Leandrew Koyanagi, MD;  Location: [facility];  Service: Orthopedics;  Laterality: Right;  . PERIPHERAL VASCULAR CATHETERIZATION N/A 11/15/2014   Procedure: Abdominal Aortogram;  Surgeon: Conrad Scotia, MD;  Location: [facility];  Service:  Cardiovascular;  Laterality: N/A;  . PERIPHERAL VASCULAR CATHETERIZATION  11/15/2014   Procedure: Lower Extremity Angiography;  Surgeon: Conrad Fowler, MD;  Location: [facility];  Service: Cardiovascular;;  . PERIPHERAL VASCULAR CATHETERIZATION Left 11/15/2014   Procedure: Peripheral Vascular Intervention;  Surgeon: Conrad , MD;  Location: [facility];  Service: Cardiovascular;  Laterality: Left;  popliteal artery stenting    Family History  Problem Relation Age of Onset  . Diabetes Mother   . Hypertension Mother   . Vision loss Mother   . Heart disease Mother   . Hypertension Father   . Thyroid disease Neg Hx     Social History:  reports that she quit smoking about 3 months ago. Her smoking use included e-cigarettes and cigarettes. She started smoking about 37 years ago. She has a 57.00 pack-year smoking history. She has never used smokeless tobacco. She reports that she does not drink alcohol or use drugs.    Review of Systems   RENAL dysfunction: Variable, currently on Jardiance but no diuretics  Lab Results  Component Value Date   CREATININE 1.18 (H) 07/03/2018   CREATININE 1.43 (H) 07/02/2018   CREATININE 1.01 (H) 07/01/2018      Lipid history: Has been treated with  Fenofibrate and Crestor  by PCP LDL is below 100    Lab Results  Component Value Date   CHOL 161 01/21/2018   HDL 47.60 01/21/2018   LDLCALC 147 (H) 05/21/2017   LDLDIRECT 91.0 01/21/2018   TRIG 234.0 (H) 01/21/2018   CHOLHDL 3 01/21/2018           She takes Lyrica for phantom pain of her leg  Blood pressure has been variable, not on antihypertensives or ACE inhibitor She thinks blood pressure has been fairly good as checked by the home health nurse   BP Readings from Last 3 Encounters:  07/04/18 (!) 148/62  06/23/18 118/67  03/11/18 130/68    Take Zoloft for depression   Physical Examination:  LMP 11/16/2010       ASSESSMENT:  Diabetes type 2, uncontrolled with  obesity  See history of present illness for detailed discussion of current diabetes management, blood sugar patterns and problems identified   A1c was last 8.3 in July  Her blood sugars appear to be reasonably good recently although she is checking readings only sporadically and mostly in the late afternoon Recently appears to have fairly good blood sugars, otherwise in the past has had either significant hypoglycemia or tendency to low sugars on her last visit She thinks she is more consistent with her Humulin R insulin and taking before starting to eat  Renal function needs to be rechecked with her being on Jardiance    PLAN:    INSULIN doses will be continued unchanged for now   Emphasized the need to check blood sugars on waking up, before each meal and at bedtime She does need to let us know if she is having consistently high or low readings Discussed blood sugar targets of at least under 180 after meals and 90-130 on waking up  Reminded her to take her Humulin R 30 minutes before eating  Labs to be done through home health agency  There are no Patient Instructions on file for this visit.   Duration of telephone encounter =7 minutes  Elayne Snare 09/11/2018, 10:28 AM   Note: This office note was prepared with Dragon voice recognition system technology. Any transcriptional errors that result from this process are unintentional.

## 2018-09-11 NOTE — Telephone Encounter (Signed)
Called number below and spoke with clinical nurse manager and provided them with a VO for labs that Dr. Dwyane Dee has ordered for the pt. Also gave them the office fax number to fax the results.

## 2018-09-12 ENCOUNTER — Telehealth: Payer: Self-pay | Admitting: Endocrinology

## 2018-09-12 ENCOUNTER — Other Ambulatory Visit: Payer: Medicare HMO

## 2018-09-12 LAB — HEPATIC FUNCTION PANEL
ALT: 5 — AB (ref 7–35)
AST: 16 (ref 13–35)
Alkaline Phosphatase: 105 (ref 25–125)
Bilirubin, Total: 0.2

## 2018-09-12 LAB — BASIC METABOLIC PANEL
BUN: 15 (ref 4–21)
Creatinine: 1 (ref 0.5–1.1)
Glucose: 45
Potassium: 4.1 (ref 3.4–5.3)
Sodium: 140 (ref 137–147)

## 2018-09-12 NOTE — Telephone Encounter (Signed)
[facility] called to report that the patients glucose lab result was 45.

## 2018-09-15 NOTE — Telephone Encounter (Signed)
Attempted to call pt again and she still did not answer, and mailbox is full.

## 2018-09-15 NOTE — Telephone Encounter (Signed)
Attempted to call pt and no one answered. Voicemail stated that her mailbox was full.

## 2018-09-15 NOTE — Telephone Encounter (Signed)
Please check with the patient if she is having low blood glucose problems and if she is checking her sugars 3 times a day as directed

## 2018-09-18 ENCOUNTER — Telehealth: Payer: Self-pay | Admitting: Endocrinology

## 2018-09-18 NOTE — Telephone Encounter (Signed)
VM box is full

## 2018-09-18 NOTE — Telephone Encounter (Signed)
-----   Message from Elayne Snare, MD sent at 09/17/2018  3:47 PM EDT ----- Appointment needs to be scheduled for 4 weeks ----- Message ----- From: Arnoldo Hooker Sent: 08/22/2018   1:08 PM EDT To: Elayne Snare, MD, [facility]  [name] has been scheduled for an MD to Patient call for 11am on Thursday 09/12/2018.   She is not able to come in for labs, but does have home health RN at the house each day.  [facility] is providing services - [phone].    ----- Message ----- From: Arnoldo Hooker Sent: 08/22/2018   9:21 AM EDT To: [facility]  LMTCB (x2) per provider patient needs to be switched to visit with Dr Dwyane Dee and same day labs  ----- Message ----- From: Elayne Snare, MD Sent: 08/19/2018   9:27 PM EDT To: Arnoldo Hooker  Labs and office visit same day ----- Message ----- From: Arnoldo Hooker Sent: 08/19/2018   3:47 PM EDT To: Elayne Snare, MD, [facility]  Dr Dwyane Dee The 4 week follow up for this patient did you want labs and visit separate or visit with same day labs? When she called [place] to schedule she told us it was a visit just for labs.    Please review and advise!  Thanks, Belenda Cruise

## 2018-09-26 ENCOUNTER — Telehealth: Payer: Self-pay | Admitting: Pulmonary Disease

## 2018-09-29 ENCOUNTER — Telehealth: Payer: Self-pay | Admitting: Orthopaedic Surgery

## 2018-09-29 NOTE — Telephone Encounter (Signed)
Called and left message on pt vm to call back to r/s PFT -pr  °

## 2018-09-29 NOTE — Telephone Encounter (Signed)
Izora Gala from [facility] called. She would like verbal orders for OT extended 1x wk 5wks & 2x wk 3wks. Her call back number is [phone]

## 2018-09-29 NOTE — Telephone Encounter (Signed)
Called to approve orders 

## 2018-09-30 ENCOUNTER — Telehealth: Payer: Self-pay | Admitting: Orthopaedic Surgery

## 2018-09-30 NOTE — Telephone Encounter (Signed)
[name] with [facility] called in needing an Rx for a drop arm bedside commode to be [facility].  [phone]

## 2018-09-30 NOTE — Telephone Encounter (Signed)
Called and left message on pt vm to call back to schedule pft and f/u with [facility]- 3rd attempt- closed encounter-pr

## 2018-10-01 NOTE — Telephone Encounter (Signed)
yes

## 2018-10-01 NOTE — Telephone Encounter (Signed)
Ok for this? 

## 2018-10-02 NOTE — Telephone Encounter (Signed)
faxed

## 2018-10-14 ENCOUNTER — Ambulatory Visit (INDEPENDENT_AMBULATORY_CARE_PROVIDER_SITE_OTHER): Payer: Medicare HMO | Admitting: Orthopaedic Surgery

## 2018-10-14 ENCOUNTER — Other Ambulatory Visit: Payer: Self-pay

## 2018-10-14 ENCOUNTER — Encounter: Payer: Self-pay | Admitting: Orthopaedic Surgery

## 2018-10-14 ENCOUNTER — Ambulatory Visit (INDEPENDENT_AMBULATORY_CARE_PROVIDER_SITE_OTHER): Payer: Medicare HMO

## 2018-10-14 ENCOUNTER — Telehealth: Payer: Self-pay | Admitting: Pulmonary Disease

## 2018-10-14 DIAGNOSIS — S82891D Other fracture of right lower leg, subsequent encounter for closed fracture with routine healing: Secondary | ICD-10-CM | POA: Diagnosis not present

## 2018-10-14 DIAGNOSIS — J411 Mucopurulent chronic bronchitis: Secondary | ICD-10-CM

## 2018-10-14 DIAGNOSIS — M545 Low back pain: Secondary | ICD-10-CM

## 2018-10-14 DIAGNOSIS — S82891S Other fracture of right lower leg, sequela: Secondary | ICD-10-CM | POA: Diagnosis not present

## 2018-10-14 DIAGNOSIS — G8929 Other chronic pain: Secondary | ICD-10-CM

## 2018-10-14 MED ORDER — TRELEGY ELLIPTA 100-62.5-25 MCG/INH IN AEPB: 1.0000 | INHALATION_SPRAY | Freq: Every day | RESPIRATORY_TRACT | 1 refills | Status: AC

## 2018-10-14 MED ORDER — METHOCARBAMOL 500 MG PO TABS: 500.0000 mg | ORAL_TABLET | Freq: Two times a day (BID) | ORAL | 0 refills | Status: DC | PRN

## 2018-10-14 MED ORDER — PREDNISONE 10 MG (21) PO TBPK: ORAL_TABLET | ORAL | 0 refills | Status: DC

## 2018-10-14 NOTE — Telephone Encounter (Signed)
Left message per DPR informing patient her refill has been sent in. Will send this message to [facility] as FYI that patient will call back to schedule her PFT due to an ankle fracture.   VS just FYI. Nothing further is needed at this time.

## 2018-10-14 NOTE — Progress Notes (Signed)
Office Visit Note   Patient: [name]           Date of Birth: 1965/11/11           MRN: [id number] Visit Date: 10/14/2018              Requested by: [name], PA-C [place],  [place] [address] PCP: [name], PA-C   Assessment & Plan: Visit Diagnoses:  1. Closed fracture of right ankle, sequela   2. Chronic low back pain, unspecified back pain laterality, unspecified whether sciatica present     Plan: Impression is #1 chronic lower back pain.  #2 status post right trimall ankle fracture treated with ex fix with asymptomatic nonunion of medial malleolus .  In regards to the lower back, we will start her own a Sterapred taper and muscle relaxer.  We will look into resuming her physical therapy for her total body deconditioning.  She will follow-up with [place] as needed.  Follow-Up Instructions: Return if symptoms worsen or fail to improve.   Orders:  Orders Placed This Encounter  Procedures   XR Ankle Complete Right   XR Lumbar Spine 2-3 Views   No orders of the defined types were placed in this encounter.     Procedures: No procedures performed   Clinical Data: No additional findings.   Subjective: Chief Complaint  Patient presents with   Right Ankle - Follow-up    HPI patient is a pleasant 53 year old female who presents our clinic today approximately 4-1/2 months status post application of right ankle external fixator with subsequent removal and casting.  She has been doing well.  She has minimal to no pain but does have a history of neuropathy.  Her main issue lately has been her back.  She has pain to the entire lower back that occasionally radiates down the back of the left leg.  She also notes pain into the right knee at times.  No anterior thigh or groin pain.  There has been no specific injury, however she notes that she has been having trouble over the past several months with transfers whether it be using a gait belt or  climbing over her wheelchair to get in and out of bed.  No bowel or bladder change.  No saddle paresthesias.  Review of Systems as detailed in HPI.  All others reviewed and are negative.   Objective: Vital Signs: LMP 11/16/2010   Physical Exam well-developed and well-nourished female in no acute distress.  Alert and oriented x3.  Ortho Exam examination of her right ankle reveals no bony tenderness.  Decreased sensation throughout.  Good range of motion throughout.  Lumbar spine reveals increased pain with flexion.  Minimally positive straight leg raise on the right.  Negative logroll.  She does have a BKA prosthesis on the left.  Specialty Comments:  No specialty comments available.  Imaging: Xr Ankle Complete Right  Result Date: 10/14/2018 X-rays demonstrate a nearly healed fibula fracture.  She does have a nonunion to the medial malleolus.  Xr Lumbar Spine 2-3 Views  Result Date: 10/14/2018 X-rays demonstrate diffuse spondylosis    PMFS History: Patient Active Problem List   Diagnosis Date Noted   Closed right ankle fracture 07/02/2018   Anemia of chronic disease 07/02/2018   Displaced bimalleolar fracture of right ankle, closed, initial encounter 06/02/2018   Avascular necrosis of bone of left hip ([name]) 04/03/2018   Mucopurulent chronic bronchitis ([place]) 03/11/2018   Allergic rhinitis 03/11/2018  Anxiety and depression 12/29/2017   Acute respiratory failure with hypoxia ([place]) 12/28/2017   Syncope 08/29/2017   DKA (diabetic ketoacidoses) ([place]) 03/02/2017   COPD exacerbation ([place]) 02/23/2017   Acute on chronic respiratory failure with hypoxia and hypercapnia ([name]) 08/19/2016   Leukocytosis 08/19/2016   CKD (chronic kidney disease), stage III ([place]) 08/19/2016   Altered mental status 08/19/2016   Asthma exacerbation 06/18/2016   Insomnia 06/12/2016   Difficult intravenous access    Acute on chronic respiratory failure with hypoxia ([place]) 05/19/2016     NSTEMI (non-ST elevated myocardial infarction) ([place]) 73/42/8768   Acute metabolic encephalopathy 11/57/2620   Hyperlipidemia    Class 3 obesity due to excess calories with serious comorbidity and body mass index (BMI) of 40.0 to 44.9 in adult    Obesity hypoventilation syndrome ([name])    Diastolic dysfunction 35/59/7416   Diabetic neuropathy ([place]) 11/10/2014   Fracture of distal femur ([place]) 07/08/2014   Osteoporosis 07/08/2014   Femur fracture, left ([place]) 07/08/2014   Colles' fracture of left radius    Acute kidney injury superimposed on CKD ([place]) 05/03/2014   Left hip pain 05/03/2014   Perimenopausal symptoms 02/13/2012   Hyponatremia 12/29/2011   Asthma exacerbation attacks 12/28/2011   Bronchitis 12/28/2011   Repeated falls 07/30/2011   Hip fracture, left ([place]) 07/16/2011   Femoral neck fracture ([place]) 07/16/2011   Tobacco abuse 04/14/2010   Hyperlipidemia associated with type 2 diabetes mellitus ([place]) 12/23/2008   COPD (chronic obstructive pulmonary disease) ([place]) 11/29/2008   THRUSH 05/20/2008   Human immunodeficiency virus (HIV) disease ([place]) 04/19/2008   Diabetes mellitus type 2 with complications, uncontrolled ([place]) 04/19/2008   Mood disorder ([place]) 04/19/2008   Hereditary and idiopathic peripheral neuropathy 04/19/2008   Hypertension associated with diabetes ([place]) 04/19/2008   Past Medical History:  Diagnosis Date   Acute metabolic encephalopathy 3/84/5364   Acute on chronic respiratory failure with hypoxia ([place]) 05/19/2016   Anxiety    ARF (acute renal failure) ([place]) 05/03/2014   Arthritis    LEFT HIP   Asthma    HOSPITALIZED WITH EXCERBATION OF ASTHMA - AND BRONCHITIS AND THE FLU DEC 2013   Asthma exacerbation attacks 12/28/2011   Bronchitis 12/28/2011   CAP (community acquired pneumonia) 07/25/2012   Chronic kidney disease (CKD), stage IV (severe) ([place]) 11/10/2014   Class 3 obesity due to excess calories with serious  comorbidity and body mass index (BMI) of 40.0 to 44.9 in adult    Colles' fracture of left radius    COPD (chronic obstructive pulmonary disease) ([place])    Depression    Diabetes mellitus    ON INSULIN AND ORAL MEDICATIONS   Diabetes mellitus type 2 with complications, uncontrolled ([place]) 04/19/2008   Qualifier: Diagnosis of  By: Tomma Lightning MD, Kelly      Diabetic foot infection ([place]) 11/09/2014   Diabetic ketoacidosis without coma associated with type 2 diabetes mellitus ([place])    Diabetic neuropathy ([place]) 11/10/2014   Diarrhea 08/30/2008   Qualifier: Diagnosis of  By: Tomma Lightning MD, Kelly     Diastolic dysfunction 68/0/3212   Difficult intravenous access    DKA, type 2 ([place]) 04/04/2016   Dyslipidemia 12/23/2008   Qualifier: Diagnosis of  By: Tomma Lightning MD, Claiborne Billings     Essential hypertension 04/19/2008   Qualifier: Diagnosis of  By: Tomma Lightning MD, Claiborne Billings     FATIGUE 07/12/2008   Qualifier: Diagnosis of  By: Tomma Lightning MD, Kelly     Femoral neck fracture ([place]) 07/16/2011   Femur fracture, left (  [place]) 07/08/2014   Fever    Fracture of distal femur ([place]) 07/08/2014   GERD (gastroesophageal reflux disease)    Hereditary and idiopathic peripheral neuropathy 04/19/2008   Qualifier: Diagnosis of  By: Tomma Lightning MD, Kelly     Hip fracture, left ([place]) 07/16/2011   Hip pain    HIV infection ([place]) 2000   Human immunodeficiency virus (HIV) disease ([place]) 04/19/2008   [id number] +    Hyperlipidemia    Hypertension    Hyponatremia 12/29/2011   Influenza A 12/29/2011   Influenza B    Insomnia 06/12/2016   Left hip pain 05/03/2014   Mood disorder ([place]) 04/19/2008   Qualifier: Diagnosis of  By: Tomma Lightning MD, Kelly     Neuropathy    NEUROPATHY HANDS AND FEET   NSTEMI (non-ST elevated myocardial infarction) ([place]) 05/19/2016   Obesity hypoventilation syndrome ([place])    Osteoporosis 07/08/2014   Pain    SEVERE PAIN LEFT HIP - HX OF LEFT HIP PINNING JULY 2013   Perimenopausal symptoms 02/13/2012   LMP  around 2011. On estrace and provera since around 2012 for hot flashes, mood swings. Estrace 2 mg daily, Provera 2.5 mg for 5 days each month.    Repeated falls 07/30/2011   Sepsis ([place]) 04/04/2016   Shortness of breath    ALLERGIES ARE "ACTING UP"   SOB (shortness of breath)    THRUSH 05/20/2008   Qualifier: Diagnosis of  By: Tomma Lightning MD, Claiborne Billings       Tobacco use disorder 04/14/2010    Family History  Problem Relation Age of Onset   Diabetes Mother    Hypertension Mother    Vision loss Mother    Heart disease Mother    Hypertension Father    Thyroid disease Neg Hx     Past Surgical History:  Procedure Laterality Date   AMPUTATION Left 11/18/2014   Procedure: LEFT BELOW KNEE AMPUTATION;  Surgeon: Leandrew Koyanagi, MD;  Location: [facility];  Service: Orthopedics;  Laterality: Left;   CAST APPLICATION Left 02/03/3005   Procedure: CAST APPLICATION (FIBERGLASS);  Surgeon: Latanya Maudlin, MD;  Location[facility];  Service: Orthopedics;  Laterality: Left;  CLOSED REDUCTION OF LEFT COLLES FRACTURE WITH SHORT ARM CAST   EXTERNAL FIXATION LEG Right 06/02/2018   Procedure: EXTERNAL FIXATION LEG;  Surgeon: Leandrew Koyanagi, MD;  Location: [facility];  Service: Orthopedics;  Laterality: Right;   EXTERNAL FIXATION REMOVAL Right 07/01/2018   Procedure: REMOVAL EXTERNAL FIXATION LEG;  Surgeon: Leandrew Koyanagi, MD;  Location: [facility];  Service: Orthopedics;  Laterality: Right;   HARDWARE REMOVAL Left 05/05/2014   Procedure: HARDWARE REMOVAL LEFT HIP;  Surgeon: Latanya Maudlin, MD;  [facility];  Service: Orthopedics;  Laterality: Left;  REMOVAL BIOMET 6.5-8.0 CANNULATED SCREW   HIP ARTHROPLASTY Left 05/05/2014   Procedure: ARTHROPLASTY OPEN REDUCTION INTERNAL FIXATION LEFT HIP AND REMOVAL OF TWO CANNULATED SCREW;  Surgeon: Latanya Maudlin, MD;  [facility];  Service: Orthopedics;  Laterality: Left;   HIP PINNING,CANNULATED  07/16/2011   Procedure: CANNULATED HIP PINNING;  Surgeon: Gearlean Alf, MD;   [facility];  Service: Orthopedics;  Laterality: Left;   HIP PINNING,CANNULATED Left 04/09/2012   Procedure: CANNULATED HIP PINNING AND HARDWARE REVISION;  Surgeon: Gearlean Alf, MD;  [facility];  Service: Orthopedics;  Laterality: Left;   I&D EXTREMITY Left 11/16/2014   Procedure:  DEBRIDEMENT OF LEFT FOOT POSSIBLE APPLICATION OF INTEGRIA AND VAC ;  Surgeon: Irene Limbo, MD;  Location[facility];  Service: Clinical cytogeneticist;  Laterality: Left;   I&D EXTREMITY Right 07/01/2018   Procedure: RIGHT ANKLE EX FIX REMOVAL, IRRIGATION AND DEBRIDEMENT AND SHORT LEG CAST;  Surgeon: Leandrew Koyanagi, MD;  Location: [facility];  Service: Orthopedics;  Laterality: Right;   PERIPHERAL VASCULAR CATHETERIZATION N/A 11/15/2014   Procedure: Abdominal Aortogram;  Surgeon: Conrad [place], MD;  Location: [facility];  Service: Cardiovascular;  Laterality: N/A;   PERIPHERAL VASCULAR CATHETERIZATION  11/15/2014   Procedure: Lower Extremity Angiography;  Surgeon: Conrad [place], MD;  Location: [facility];  Service: Cardiovascular;;   PERIPHERAL VASCULAR CATHETERIZATION Left 11/15/2014   Procedure: Peripheral Vascular Intervention;  Surgeon: Conrad [place], MD;  Location: [facility];  Service: Cardiovascular;  Laterality: Left;  popliteal artery stenting   Social History   Occupational History   Not on file  Tobacco Use   Smoking status: Former Smoker    Packs/day: 1.50    Years: 38.00    Pack years: 57.00    Types: E-cigarettes, Cigarettes    Start date: 03/10/1981    Quit date: 06/01/2018    Years since quitting: 0.3   Smokeless tobacco: Never Used   Tobacco comment: 3/4 of a pack per day 03/11/18  Substance and Sexual Activity   Alcohol use: No    Alcohol/week: 0.0 standard drinks    Comment: no drink since 2008   Drug use: No    Comment: hx of crack/cocaine, clean 8 years   Sexual activity: Not Currently    Partners: Male

## 2018-10-23 ENCOUNTER — Telehealth: Payer: Self-pay | Admitting: Orthopaedic Surgery

## 2018-10-23 NOTE — Telephone Encounter (Signed)
[name] with [facility] called. She needs Verbal orders for social work consult. Also patient fell on 10/20. Her call back number is [phone]

## 2018-10-29 NOTE — Telephone Encounter (Signed)
Called [name] back no answer LMOM approving orders.

## 2018-11-05 ENCOUNTER — Telehealth: Payer: Self-pay

## 2018-11-05 ENCOUNTER — Telehealth: Payer: Self-pay | Admitting: Endocrinology

## 2018-11-05 NOTE — Telephone Encounter (Signed)
Called to approve orders 

## 2018-11-05 NOTE — Telephone Encounter (Signed)
Lisabeth Pick physical therapist from [facility] called and Is requesting verbal orders for continuation 1 wk 1, 2wk 3 for PT strengthening, mobility training and patient education.   CB [id number]

## 2018-11-05 NOTE — Telephone Encounter (Signed)
Patient called to advise that her waking blood sugars are ranging between 20 and 40.  Is only taking Jardiance and Toujeo.  She is not taking Humulin R.  Would like to know how to proceed so that her sugars are not so low?  [facility]'s call back number [phone]

## 2018-11-05 NOTE — Telephone Encounter (Signed)
Unable to reach patient on the phone.  Message left to cut her Toujeo in half and to follow-up in 2 weeks in the office or virtual visit.  Please call to confirm

## 2018-11-06 NOTE — Telephone Encounter (Signed)
Attempted to contact pt as well. Received no answer at this time.

## 2018-11-06 NOTE — Telephone Encounter (Signed)
Attempted to call pt again. Pt did not answer and was not able to leave a voicemail.

## 2018-11-07 ENCOUNTER — Telehealth: Payer: Self-pay | Admitting: Orthopaedic Surgery

## 2018-11-07 NOTE — Telephone Encounter (Signed)
FYI

## 2018-11-07 NOTE — Telephone Encounter (Signed)
Spoke with [place] worker with [facility] advised she had difficulty reaching the patient this week so the visit will be delayed until next week. The number to contact Morene Antu is [phone]

## 2018-11-07 NOTE — Telephone Encounter (Signed)
Called pt and gave her MD message. Pt verbalized understanding. Pt also stated that she relies on her daughter in law for transportation and she cannot schedule an appt without first speaking with her to see if the daughter in law can bring her. She stated that she would speak with her and call this office back.

## 2018-11-07 NOTE — Telephone Encounter (Signed)
We need to make sure her meter is accurate since she cannot get low sugars without any diabetes medicines.  What meter is she using and is her test trips out of date? What blood sugars does she have the rest of the day? She will need to probably come in and also get lab work done, please schedule

## 2018-11-07 NOTE — Telephone Encounter (Signed)
Pt returned phone call and stated that she did not receive Dr. Ronnie Derby message. She stated that the last two days she has not even taken Toujeo, but she is still waking up with blood sugars of 40-50.  Pt is not taking Jardiance, Toujeo, or Humulin R U500.

## 2018-11-10 NOTE — Telephone Encounter (Signed)
We still need to see her in the office as soon as possible

## 2018-11-10 NOTE — Telephone Encounter (Signed)
Patient updated test strips and blood sugars are being recorded more accurately. She is taking her Jardiance and Toujeo at this time and will be starting her Humulin R

## 2018-11-11 NOTE — Telephone Encounter (Signed)
Called pt and left voicemail requesting a call back to schedule an appt.

## 2018-11-13 ENCOUNTER — Telehealth: Payer: Self-pay | Admitting: Orthopaedic Surgery

## 2018-11-13 NOTE — Telephone Encounter (Signed)
Gaspar Bidding from [facility] left a voicemail message yesterday to let Dr. Erlinda Hong know that the patient has missed the PT appointment due to a family member being sick and waiting for the COVID test to come back.  [phone].  Thank you.

## 2018-11-13 NOTE — Telephone Encounter (Signed)
Received voicemail message from [place] Worker with [facility] stating there will be a continued delay in the social work eval. Patient report that there are people in the household that do have covid symptoms and awaiting test results. Services will be postponed until a later date and will keep the doctor posted. The number to contact Morene Antu is [phone]

## 2018-11-13 NOTE — Telephone Encounter (Signed)
FYI

## 2018-12-04 ENCOUNTER — Telehealth: Payer: Self-pay | Admitting: Orthopaedic Surgery

## 2018-12-04 NOTE — Telephone Encounter (Signed)
Received call from [facility]- (OT)  needing verbal orders for (OT and PT) eval   The number to contact [facility] is [phone]

## 2018-12-04 NOTE — Telephone Encounter (Signed)
Called to approve orders 

## 2018-12-10 ENCOUNTER — Telehealth: Payer: Self-pay | Admitting: Orthopaedic Surgery

## 2018-12-10 NOTE — Telephone Encounter (Signed)
Please advise 

## 2018-12-10 NOTE — Telephone Encounter (Signed)
Patient called and stated that [name] did surgery on ankle and xray on back Advised patient that the ball the falling. Patient wanted to know what he recommenced from this point.  Please call patient to advise.  [phone]

## 2018-12-10 NOTE — Telephone Encounter (Signed)
What does "Advised patient that the ball the falling." mean. I do not understand this part of message.

## 2018-12-10 NOTE — Telephone Encounter (Signed)
Patient called stating that [name] did an xray on her back and told her the ball  Was falling in her back. Wants to know what he recommends at this time.  Please call patient to [phone]

## 2018-12-10 NOTE — Telephone Encounter (Signed)
I'm not exactly sure what the ball refers to.  Can you have her clarify please. Thanks.

## 2018-12-11 NOTE — Telephone Encounter (Signed)
Yeah the ball part of her hip joint is severely eroded away.  If she's having pain, she could try a hip injection

## 2018-12-11 NOTE — Telephone Encounter (Signed)
Patient called and I advised of the message from XU and read my note back to her. She stated that is the correct message she left. When she is referring to the ball she is talking about the ball in her hip.  Wants someone to call her and advise what to do.[phone]

## 2018-12-12 NOTE — Telephone Encounter (Signed)
I attempted to reach patient to schedule injection with Dr. Junius Roads, but got voicemail.

## 2018-12-12 NOTE — Telephone Encounter (Signed)
First available with whomever.

## 2018-12-12 NOTE — Telephone Encounter (Signed)
Patient is interested in hip injection after Christmas. Would you like for this to be scheduled with Dr. Junius Roads or Dr. Ernestina Patches?

## 2018-12-15 NOTE — Telephone Encounter (Signed)
Called patient no answer. LMOM with details. She can make appt with Dr Junius Roads for hip injection.

## 2018-12-18 NOTE — Telephone Encounter (Signed)
Called patient no answer LMOM. See messages below.

## 2018-12-19 ENCOUNTER — Telehealth: Payer: Self-pay | Admitting: Orthopaedic Surgery

## 2018-12-19 NOTE — Telephone Encounter (Signed)
Patient called. She said she sent paperwork over already to Dr.Xu and is waiting for his signature to start Physical Therapy.     Call back: [phone]

## 2018-12-24 ENCOUNTER — Inpatient Hospital Stay (HOSPITAL_COMMUNITY)
Admission: EM | Admit: 2018-12-24 | Discharge: 2019-01-09 | DRG: 481 | Disposition: A | Discharge status: Skilled Nursing Facility | Payer: Medicare HMO | Attending: Internal Medicine | Admitting: Internal Medicine

## 2018-12-24 ENCOUNTER — Inpatient Hospital Stay (HOSPITAL_COMMUNITY): Payer: Medicare HMO

## 2018-12-24 ENCOUNTER — Emergency Department (HOSPITAL_COMMUNITY): Payer: Medicare HMO

## 2018-12-24 ENCOUNTER — Encounter (HOSPITAL_COMMUNITY): Payer: Self-pay

## 2018-12-24 ENCOUNTER — Other Ambulatory Visit: Payer: Self-pay

## 2018-12-24 DIAGNOSIS — N179 Acute kidney failure, unspecified: Secondary | ICD-10-CM | POA: Diagnosis not present

## 2018-12-24 DIAGNOSIS — Z7951 Long term (current) use of inhaled steroids: Secondary | ICD-10-CM

## 2018-12-24 DIAGNOSIS — F329 Major depressive disorder, single episode, unspecified: Secondary | ICD-10-CM | POA: Diagnosis present

## 2018-12-24 DIAGNOSIS — R8281 Pyuria: Secondary | ICD-10-CM | POA: Diagnosis present

## 2018-12-24 DIAGNOSIS — S2232XA Fracture of one rib, left side, initial encounter for closed fracture: Secondary | ICD-10-CM | POA: Diagnosis present

## 2018-12-24 DIAGNOSIS — S72141A Displaced intertrochanteric fracture of right femur, initial encounter for closed fracture: Principal | ICD-10-CM | POA: Diagnosis present

## 2018-12-24 DIAGNOSIS — J449 Chronic obstructive pulmonary disease, unspecified: Secondary | ICD-10-CM | POA: Diagnosis present

## 2018-12-24 DIAGNOSIS — Z9981 Dependence on supplemental oxygen: Secondary | ICD-10-CM | POA: Diagnosis not present

## 2018-12-24 DIAGNOSIS — R5383 Other fatigue: Secondary | ICD-10-CM

## 2018-12-24 DIAGNOSIS — E1143 Type 2 diabetes mellitus with diabetic autonomic (poly)neuropathy: Secondary | ICD-10-CM | POA: Diagnosis present

## 2018-12-24 DIAGNOSIS — F419 Anxiety disorder, unspecified: Secondary | ICD-10-CM | POA: Diagnosis present

## 2018-12-24 DIAGNOSIS — E1122 Type 2 diabetes mellitus with diabetic chronic kidney disease: Secondary | ICD-10-CM | POA: Diagnosis present

## 2018-12-24 DIAGNOSIS — I252 Old myocardial infarction: Secondary | ICD-10-CM

## 2018-12-24 DIAGNOSIS — I5032 Chronic diastolic (congestive) heart failure: Secondary | ICD-10-CM | POA: Diagnosis present

## 2018-12-24 DIAGNOSIS — N1831 Chronic kidney disease, stage 3a: Secondary | ICD-10-CM | POA: Diagnosis not present

## 2018-12-24 DIAGNOSIS — B3749 Other urogenital candidiasis: Secondary | ICD-10-CM | POA: Diagnosis present

## 2018-12-24 DIAGNOSIS — Z21 Asymptomatic human immunodeficiency virus [HIV] infection status: Secondary | ICD-10-CM | POA: Diagnosis present

## 2018-12-24 DIAGNOSIS — Z993 Dependence on wheelchair: Secondary | ICD-10-CM

## 2018-12-24 DIAGNOSIS — J9611 Chronic respiratory failure with hypoxia: Secondary | ICD-10-CM | POA: Diagnosis present

## 2018-12-24 DIAGNOSIS — E662 Morbid (severe) obesity with alveolar hypoventilation: Secondary | ICD-10-CM | POA: Diagnosis present

## 2018-12-24 DIAGNOSIS — Z89512 Acquired absence of left leg below knee: Secondary | ICD-10-CM | POA: Diagnosis not present

## 2018-12-24 DIAGNOSIS — R55 Syncope and collapse: Secondary | ICD-10-CM | POA: Diagnosis present

## 2018-12-24 DIAGNOSIS — L8990 Pressure ulcer of unspecified site, unspecified stage: Secondary | ICD-10-CM | POA: Insufficient documentation

## 2018-12-24 DIAGNOSIS — I130 Hypertensive heart and chronic kidney disease with heart failure and stage 1 through stage 4 chronic kidney disease, or unspecified chronic kidney disease: Secondary | ICD-10-CM | POA: Diagnosis present

## 2018-12-24 DIAGNOSIS — D62 Acute posthemorrhagic anemia: Secondary | ICD-10-CM | POA: Diagnosis not present

## 2018-12-24 DIAGNOSIS — Z8249 Family history of ischemic heart disease and other diseases of the circulatory system: Secondary | ICD-10-CM

## 2018-12-24 DIAGNOSIS — W19XXXA Unspecified fall, initial encounter: Secondary | ICD-10-CM | POA: Diagnosis not present

## 2018-12-24 DIAGNOSIS — E876 Hypokalemia: Secondary | ICD-10-CM | POA: Diagnosis not present

## 2018-12-24 DIAGNOSIS — W1811XA Fall from or off toilet without subsequent striking against object, initial encounter: Secondary | ICD-10-CM | POA: Diagnosis present

## 2018-12-24 DIAGNOSIS — Z96642 Presence of left artificial hip joint: Secondary | ICD-10-CM | POA: Diagnosis present

## 2018-12-24 DIAGNOSIS — E785 Hyperlipidemia, unspecified: Secondary | ICD-10-CM | POA: Diagnosis present

## 2018-12-24 DIAGNOSIS — Y92012 Bathroom of single-family (private) house as the place of occurrence of the external cause: Secondary | ICD-10-CM

## 2018-12-24 DIAGNOSIS — Z833 Family history of diabetes mellitus: Secondary | ICD-10-CM

## 2018-12-24 DIAGNOSIS — D649 Anemia, unspecified: Secondary | ICD-10-CM | POA: Diagnosis not present

## 2018-12-24 DIAGNOSIS — E875 Hyperkalemia: Secondary | ICD-10-CM | POA: Diagnosis not present

## 2018-12-24 DIAGNOSIS — E1165 Type 2 diabetes mellitus with hyperglycemia: Secondary | ICD-10-CM | POA: Diagnosis not present

## 2018-12-24 DIAGNOSIS — I959 Hypotension, unspecified: Secondary | ICD-10-CM | POA: Diagnosis not present

## 2018-12-24 DIAGNOSIS — J438 Other emphysema: Secondary | ICD-10-CM | POA: Diagnosis not present

## 2018-12-24 DIAGNOSIS — Z20822 Contact with and (suspected) exposure to covid-19: Secondary | ICD-10-CM | POA: Diagnosis present

## 2018-12-24 DIAGNOSIS — S72001A Fracture of unspecified part of neck of right femur, initial encounter for closed fracture: Secondary | ICD-10-CM

## 2018-12-24 DIAGNOSIS — B20 Human immunodeficiency virus [HIV] disease: Secondary | ICD-10-CM | POA: Diagnosis not present

## 2018-12-24 DIAGNOSIS — R0902 Hypoxemia: Secondary | ICD-10-CM

## 2018-12-24 DIAGNOSIS — K219 Gastro-esophageal reflux disease without esophagitis: Secondary | ICD-10-CM | POA: Diagnosis present

## 2018-12-24 DIAGNOSIS — R627 Adult failure to thrive: Secondary | ICD-10-CM | POA: Diagnosis not present

## 2018-12-24 DIAGNOSIS — N184 Chronic kidney disease, stage 4 (severe): Secondary | ICD-10-CM | POA: Diagnosis present

## 2018-12-24 DIAGNOSIS — Z9889 Other specified postprocedural states: Secondary | ICD-10-CM

## 2018-12-24 DIAGNOSIS — R339 Retention of urine, unspecified: Secondary | ICD-10-CM

## 2018-12-24 DIAGNOSIS — E860 Dehydration: Secondary | ICD-10-CM | POA: Diagnosis present

## 2018-12-24 DIAGNOSIS — Z79899 Other long term (current) drug therapy: Secondary | ICD-10-CM

## 2018-12-24 DIAGNOSIS — R9431 Abnormal electrocardiogram [ECG] [EKG]: Secondary | ICD-10-CM | POA: Diagnosis not present

## 2018-12-24 DIAGNOSIS — Z794 Long term (current) use of insulin: Secondary | ICD-10-CM | POA: Diagnosis not present

## 2018-12-24 DIAGNOSIS — R5381 Other malaise: Secondary | ICD-10-CM | POA: Diagnosis present

## 2018-12-24 DIAGNOSIS — E1140 Type 2 diabetes mellitus with diabetic neuropathy, unspecified: Secondary | ICD-10-CM | POA: Diagnosis not present

## 2018-12-24 DIAGNOSIS — Z419 Encounter for procedure for purposes other than remedying health state, unspecified: Secondary | ICD-10-CM

## 2018-12-24 DIAGNOSIS — N183 Chronic kidney disease, stage 3 unspecified: Secondary | ICD-10-CM | POA: Diagnosis not present

## 2018-12-24 DIAGNOSIS — L89612 Pressure ulcer of right heel, stage 2: Secondary | ICD-10-CM | POA: Diagnosis present

## 2018-12-24 DIAGNOSIS — Z87891 Personal history of nicotine dependence: Secondary | ICD-10-CM

## 2018-12-24 DIAGNOSIS — Z888 Allergy status to other drugs, medicaments and biological substances status: Secondary | ICD-10-CM

## 2018-12-24 DIAGNOSIS — E119 Type 2 diabetes mellitus without complications: Secondary | ICD-10-CM | POA: Diagnosis not present

## 2018-12-24 DIAGNOSIS — Z6833 Body mass index (BMI) 33.0-33.9, adult: Secondary | ICD-10-CM

## 2018-12-24 LAB — CBC WITH DIFFERENTIAL/PLATELET
Abs Immature Granulocytes: 0 10*3/uL (ref 0.00–0.07)
Basophils Absolute: 0.1 10*3/uL (ref 0.0–0.1)
Basophils Relative: 1 %
Eosinophils Absolute: 0 10*3/uL (ref 0.0–0.5)
Eosinophils Relative: 0 %
HCT: 43.2 % (ref 36.0–46.0)
Hemoglobin: 12.8 g/dL (ref 12.0–15.0)
Lymphocytes Relative: 8 %
Lymphs Abs: 0.9 10*3/uL (ref 0.7–4.0)
MCH: 24 pg — ABNORMAL LOW (ref 26.0–34.0)
MCHC: 29.6 g/dL — ABNORMAL LOW (ref 30.0–36.0)
MCV: 81.1 fL (ref 80.0–100.0)
Monocytes Absolute: 0.1 10*3/uL (ref 0.1–1.0)
Monocytes Relative: 1 %
Neutro Abs: 10.4 10*3/uL — ABNORMAL HIGH (ref 1.7–7.7)
Neutrophils Relative %: 90 %
Platelets: 296 10*3/uL (ref 150–400)
RBC: 5.33 MIL/uL — ABNORMAL HIGH (ref 3.87–5.11)
RDW: 22.6 % — ABNORMAL HIGH (ref 11.5–15.5)
WBC: 11.6 10*3/uL — ABNORMAL HIGH (ref 4.0–10.5)
nRBC: 0 % (ref 0.0–0.2)
nRBC: 0 /100 WBC

## 2018-12-24 LAB — COMPREHENSIVE METABOLIC PANEL
ALT: 15 U/L (ref 0–44)
AST: 21 U/L (ref 15–41)
Albumin: 2.4 g/dL — ABNORMAL LOW (ref 3.5–5.0)
Alkaline Phosphatase: 187 U/L — ABNORMAL HIGH (ref 38–126)
Anion gap: 14 (ref 5–15)
BUN: 9 mg/dL (ref 6–20)
CO2: 27 mmol/L (ref 22–32)
Calcium: 8.8 mg/dL — ABNORMAL LOW (ref 8.9–10.3)
Chloride: 99 mmol/L (ref 98–111)
Creatinine, Ser: 1.33 mg/dL — ABNORMAL HIGH (ref 0.44–1.00)
GFR calc Af Amer: 52 mL/min — ABNORMAL LOW (ref >60)
GFR calc non Af Amer: 45 mL/min — ABNORMAL LOW (ref >60)
Glucose, Bld: 196 mg/dL — ABNORMAL HIGH (ref 70–99)
Potassium: 4.2 mmol/L (ref 3.5–5.1)
Sodium: 140 mmol/L (ref 135–145)
Total Bilirubin: 1 mg/dL (ref 0.3–1.2)
Total Protein: 7 g/dL (ref 6.5–8.1)

## 2018-12-24 LAB — ECHOCARDIOGRAM COMPLETE
Height: 70 in
Weight: 3760 oz

## 2018-12-24 LAB — TROPONIN I (HIGH SENSITIVITY)
Troponin I (High Sensitivity): 15 ng/L (ref <18)
Troponin I (High Sensitivity): 12 ng/L (ref <18)

## 2018-12-24 LAB — HEMOGLOBIN A1C
Hgb A1c MFr Bld: 7.1 % — ABNORMAL HIGH (ref 4.8–5.6)
Mean Plasma Glucose: 157.07 mg/dL

## 2018-12-24 LAB — LACTIC ACID, PLASMA
Lactic Acid, Venous: 1.7 mmol/L (ref 0.5–1.9)
Lactic Acid, Venous: 0.9 mmol/L (ref 0.5–1.9)

## 2018-12-24 LAB — GLUCOSE, CAPILLARY
Glucose-Capillary: 164 mg/dL — ABNORMAL HIGH (ref 70–99)
Glucose-Capillary: 136 mg/dL — ABNORMAL HIGH (ref 70–99)

## 2018-12-24 LAB — POC SARS CORONAVIRUS 2 AG -  ED: SARS Coronavirus 2 Ag: NEGATIVE

## 2018-12-24 LAB — SARS CORONAVIRUS 2 (TAT 6-24 HRS): SARS Coronavirus 2: NEGATIVE

## 2018-12-24 MED ORDER — PANTOPRAZOLE SODIUM 40 MG PO TBEC
40.0000 mg | DELAYED_RELEASE_TABLET | Freq: Every day | ORAL | Status: DC
  Administered 2018-12-25 – 2019-01-09 (×16): 40 mg via ORAL
  Filled 2018-12-24 (×18): qty 1

## 2018-12-24 MED ORDER — INSULIN GLARGINE 100 UNIT/ML ~~LOC~~ SOLN
40.0000 [IU] | Freq: Two times a day (BID) | SUBCUTANEOUS | Status: DC
  Administered 2018-12-24 – 2018-12-28 (×6): 40 [IU] via SUBCUTANEOUS
  Filled 2018-12-24 (×10): qty 0.4

## 2018-12-24 MED ORDER — BICTEGRAVIR-EMTRICITAB-TENOFOV 50-200-25 MG PO TABS
1.0000 | ORAL_TABLET | Freq: Every day | ORAL | Status: DC
  Administered 2018-12-25 – 2019-01-09 (×14): 1 via ORAL
  Filled 2018-12-24 (×16): qty 1

## 2018-12-24 MED ORDER — PREGABALIN 75 MG PO CAPS
225.0000 mg | ORAL_CAPSULE | Freq: Two times a day (BID) | ORAL | Status: DC
  Administered 2018-12-24 – 2019-01-09 (×31): 225 mg via ORAL
  Filled 2018-12-24: qty 3
  Filled 2018-12-24: qty 9
  Filled 2018-12-24 (×4): qty 3
  Filled 2018-12-24: qty 9
  Filled 2018-12-24 (×15): qty 3
  Filled 2018-12-24: qty 9
  Filled 2018-12-24 (×8): qty 3

## 2018-12-24 MED ORDER — FLUTICASONE PROPIONATE 50 MCG/ACT NA SUSP
1.0000 | Freq: Every day | NASAL | Status: DC
  Administered 2018-12-25 – 2019-01-02 (×7): 1 via NASAL
  Filled 2018-12-24: qty 16

## 2018-12-24 MED ORDER — SERTRALINE HCL 50 MG PO TABS: 50.0000 mg | ORAL_TABLET | Freq: Every day | ORAL | Status: DC

## 2018-12-24 MED ORDER — POVIDONE-IODINE 10 % EX SWAB: 2.0000 "application " | Freq: Once | CUTANEOUS | Status: DC

## 2018-12-24 MED ORDER — SERTRALINE HCL 50 MG PO TABS
150.0000 mg | ORAL_TABLET | Freq: Every day | ORAL | Status: DC
  Administered 2018-12-25 – 2019-01-09 (×16): 150 mg via ORAL
  Filled 2018-12-24 (×17): qty 1

## 2018-12-24 MED ORDER — ALBUTEROL SULFATE (2.5 MG/3ML) 0.083% IN NEBU
2.5000 mL | INHALATION_SOLUTION | Freq: Four times a day (QID) | RESPIRATORY_TRACT | Status: DC | PRN
  Administered 2018-12-26: 2.5 mL via RESPIRATORY_TRACT
  Filled 2018-12-24: qty 3

## 2018-12-24 MED ORDER — FLUTICASONE-UMECLIDIN-VILANT 100-62.5-25 MCG/INH IN AEPB: 1.0000 | INHALATION_SPRAY | Freq: Every day | RESPIRATORY_TRACT | Status: DC

## 2018-12-24 MED ORDER — SODIUM CHLORIDE 0.9 % IV SOLN: INTRAVENOUS | Status: DC

## 2018-12-24 MED ORDER — ROSUVASTATIN CALCIUM 20 MG PO TABS
20.0000 mg | ORAL_TABLET | Freq: Every day | ORAL | Status: DC
  Administered 2018-12-25 – 2019-01-09 (×16): 20 mg via ORAL
  Filled 2018-12-24 (×17): qty 1

## 2018-12-24 MED ORDER — UMECLIDINIUM BROMIDE 62.5 MCG/INH IN AEPB
1.0000 | INHALATION_SPRAY | Freq: Every day | RESPIRATORY_TRACT | Status: DC
  Administered 2018-12-25 – 2019-01-09 (×9): 1 via RESPIRATORY_TRACT
  Filled 2018-12-24 (×2): qty 7

## 2018-12-24 MED ORDER — ESOMEPRAZOLE MAGNESIUM 20 MG PO PACK: 20.0000 mg | PACK | Freq: Every day | ORAL | Status: DC

## 2018-12-24 MED ORDER — ENOXAPARIN SODIUM 40 MG/0.4ML ~~LOC~~ SOLN
40.0000 mg | SUBCUTANEOUS | Status: DC
  Administered 2018-12-24: 18:00:00 40 mg via SUBCUTANEOUS
  Filled 2018-12-24: qty 0.4

## 2018-12-24 MED ORDER — ACETAMINOPHEN 325 MG PO TABS
650.0000 mg | ORAL_TABLET | ORAL | Status: DC | PRN
  Administered 2018-12-24: 650 mg via ORAL
  Filled 2018-12-24: qty 2

## 2018-12-24 MED ORDER — FLUTICASONE FUROATE-VILANTEROL 100-25 MCG/INH IN AEPB
1.0000 | INHALATION_SPRAY | Freq: Every day | RESPIRATORY_TRACT | Status: DC
  Administered 2018-12-25 – 2019-01-09 (×11): 1 via RESPIRATORY_TRACT
  Filled 2018-12-24 (×2): qty 28

## 2018-12-24 MED ORDER — ENSURE PRE-SURGERY PO LIQD
296.0000 mL | Freq: Once | ORAL | Status: AC
  Administered 2018-12-25: 03:00:00 296 mL via ORAL
  Filled 2018-12-24: qty 296

## 2018-12-24 MED ORDER — FENTANYL CITRATE (PF) 100 MCG/2ML IJ SOLN
25.0000 ug | Freq: Once | INTRAMUSCULAR | Status: AC
  Administered 2018-12-24: 25 ug via INTRAVENOUS
  Filled 2018-12-24: qty 2

## 2018-12-24 MED ORDER — FENTANYL CITRATE (PF) 100 MCG/2ML IJ SOLN
25.0000 ug | Freq: Once | INTRAMUSCULAR | Status: AC
  Administered 2018-12-24: 13:00:00 25 ug via INTRAVENOUS
  Filled 2018-12-24: qty 2

## 2018-12-24 MED ORDER — SODIUM CHLORIDE 0.9 % IV BOLUS
1000.0000 mL | Freq: Once | INTRAVENOUS | Status: AC
  Administered 2018-12-24: 1000 mL via INTRAVENOUS

## 2018-12-24 MED ORDER — INSULIN ASPART 100 UNIT/ML ~~LOC~~ SOLN
0.0000 [IU] | Freq: Every day | SUBCUTANEOUS | Status: DC
  Administered 2019-01-03: 2 [IU] via SUBCUTANEOUS
  Administered 2019-01-04: 4 [IU] via SUBCUTANEOUS

## 2018-12-24 MED ORDER — IPRATROPIUM-ALBUTEROL 0.5-2.5 (3) MG/3ML IN SOLN: 3.0000 mL | Freq: Four times a day (QID) | RESPIRATORY_TRACT | Status: DC | PRN

## 2018-12-24 MED ORDER — INSULIN ASPART 100 UNIT/ML ~~LOC~~ SOLN
0.0000 [IU] | Freq: Three times a day (TID) | SUBCUTANEOUS | Status: DC
  Administered 2018-12-24: 18:00:00 2 [IU] via SUBCUTANEOUS
  Administered 2018-12-25 – 2019-01-01 (×5): 1 [IU] via SUBCUTANEOUS
  Administered 2019-01-01: 3 [IU] via SUBCUTANEOUS
  Administered 2019-01-02: 5 [IU] via SUBCUTANEOUS
  Administered 2019-01-02: 2 [IU] via SUBCUTANEOUS
  Administered 2019-01-02 – 2019-01-03 (×2): 3 [IU] via SUBCUTANEOUS
  Administered 2019-01-03: 2 [IU] via SUBCUTANEOUS
  Administered 2019-01-03: 3 [IU] via SUBCUTANEOUS
  Administered 2019-01-04: 7 [IU] via SUBCUTANEOUS
  Administered 2019-01-04: 3 [IU] via SUBCUTANEOUS
  Administered 2019-01-04: 2 [IU] via SUBCUTANEOUS
  Administered 2019-01-05: 7 [IU] via SUBCUTANEOUS
  Administered 2019-01-05 (×2): 5 [IU] via SUBCUTANEOUS
  Administered 2019-01-06: 7 [IU] via SUBCUTANEOUS

## 2018-12-24 MED ORDER — MORPHINE SULFATE (PF) 2 MG/ML IV SOLN
1.0000 mg | INTRAVENOUS | Status: DC | PRN
  Administered 2018-12-24 – 2018-12-25 (×5): 2 mg via INTRAVENOUS
  Filled 2018-12-24 (×5): qty 1

## 2018-12-24 MED ORDER — SERTRALINE HCL 100 MG PO TABS: 100.0000 mg | ORAL_TABLET | Freq: Every day | ORAL | Status: DC

## 2018-12-24 MED ORDER — ONDANSETRON HCL 4 MG/2ML IJ SOLN
4.0000 mg | Freq: Four times a day (QID) | INTRAMUSCULAR | Status: DC | PRN
  Administered 2018-12-24: 16:00:00 4 mg via INTRAVENOUS
  Filled 2018-12-24: qty 2

## 2018-12-24 MED ORDER — ALPRAZOLAM 0.5 MG PO TABS
0.5000 mg | ORAL_TABLET | Freq: Three times a day (TID) | ORAL | Status: DC
  Administered 2018-12-24 – 2018-12-25 (×4): 0.5 mg via ORAL
  Filled 2018-12-24 (×5): qty 1

## 2018-12-24 NOTE — ED Notes (Signed)
Unsuccessful attempt to give report  Call back in 10 minutes

## 2018-12-24 NOTE — ED Notes (Signed)
Admitting doctor at  The bedside 

## 2018-12-24 NOTE — Consult Note (Signed)
Reason for Consult:Right hip fx Referring Physician: D Shereece Wellborn I [name] is an 53 y.o. female.  HPI: [name] fell off the commode earlier today. She had immediate right hip pain and could not get up. She was brought to the ED where x-rays showed a right hip fx and some pelvic fxs and orthopedic surgery was consulted. She c/o pain in her right hip only. She is s/p BKA on the left. She has a prosthesis but is WC bound and only stands for transfers.  Past Medical History:  Diagnosis Date  . Acute metabolic encephalopathy 1/61/0960  . Acute on chronic respiratory failure with hypoxia ([place]) 05/19/2016  . Anxiety   . ARF (acute renal failure) ([place]) 05/03/2014  . Arthritis    LEFT HIP  . Asthma    HOSPITALIZED WITH EXCERBATION OF ASTHMA - AND BRONCHITIS AND THE FLU DEC 2013  . Asthma exacerbation attacks 12/28/2011  . Bronchitis 12/28/2011  . CAP (community acquired pneumonia) 07/25/2012  . Chronic kidney disease (CKD), stage IV (severe) ([place]) 11/10/2014  . Class 3 obesity due to excess calories with serious comorbidity and body mass index (BMI) of 40.0 to 44.9 in adult   . Colles' fracture of left radius   . COPD (chronic obstructive pulmonary disease) ([place])   . Depression   . Diabetes mellitus    ON INSULIN AND ORAL MEDICATIONS  . Diabetes mellitus type 2 with complications, uncontrolled ([place]) 04/19/2008   Qualifier: Diagnosis of  By: Tomma Lightning MD, Claiborne Billings     . Diabetic foot infection ([place]) 11/09/2014  . Diabetic ketoacidosis without coma associated with type 2 diabetes mellitus ([place])   . Diabetic neuropathy ([place]) 11/10/2014  . Diarrhea 08/30/2008   Qualifier: Diagnosis of  By: Tomma Lightning MD, Claiborne Billings    . Diastolic dysfunction 45/04/979  . Difficult intravenous access   . DKA, type 2 ([place]) 04/04/2016  . Dyslipidemia 12/23/2008   Qualifier: Diagnosis of  By: Tomma Lightning MD, Claiborne Billings    . Essential hypertension 04/19/2008   Qualifier: Diagnosis of  By: Tomma Lightning MD, Claiborne Billings    . FATIGUE 07/12/2008   Qualifier:  Diagnosis of  By: Tomma Lightning MD, Claiborne Billings    . Femoral neck fracture ([place]) 07/16/2011  . Femur fracture, left ([place]) 07/08/2014  . Fever   . Fracture of distal femur ([place]) 07/08/2014  . GERD (gastroesophageal reflux disease)   . Hereditary and idiopathic peripheral neuropathy 04/19/2008   Qualifier: Diagnosis of  By: Tomma Lightning MD, Claiborne Billings    . Hip fracture, left ([place]) 07/16/2011  . Hip pain   . HIV infection ([place]) 2000  . Human immunodeficiency virus (HIV) disease ([place]) 04/19/2008   [id number] +   . Hyperlipidemia   . Hypertension   . Hyponatremia 12/29/2011  . Influenza A 12/29/2011  . Influenza B   . Insomnia 06/12/2016  . Left hip pain 05/03/2014  . Mood disorder ([place]) 04/19/2008   Qualifier: Diagnosis of  By: Tomma Lightning MD, Claiborne Billings    . Neuropathy    NEUROPATHY HANDS AND FEET  . NSTEMI (non-ST elevated myocardial infarction) ([place]) 05/19/2016  . Obesity hypoventilation syndrome ([place])   . Osteoporosis 07/08/2014  . Pain    SEVERE PAIN LEFT HIP - HX OF LEFT HIP PINNING JULY 2013  . Perimenopausal symptoms 02/13/2012   LMP around 2011. On estrace and provera since around 2012 for hot flashes, mood swings. Estrace 2 mg daily, Provera 2.5 mg for 5 days each month.   . Repeated falls 07/30/2011  . Sepsis ([place]) 04/04/2016  .  Shortness of breath    ALLERGIES ARE "ACTING UP"  . SOB (shortness of breath)   . THRUSH 05/20/2008   Qualifier: Diagnosis of  By: Tomma Lightning MD, Claiborne Billings      . Tobacco use disorder 04/14/2010    Past Surgical History:  Procedure Laterality Date  . AMPUTATION Left 11/18/2014   Procedure: LEFT BELOW KNEE AMPUTATION;  Surgeon: Leandrew Koyanagi, MD;  Location: [facility];  Service: Orthopedics;  Laterality: Left;  . CAST APPLICATION Left 9/0/3009   Procedure: CAST APPLICATION (FIBERGLASS);  Surgeon: Latanya Maudlin, MD;  Location: [facility];  Service: Orthopedics;  Laterality: Left;  CLOSED REDUCTION OF LEFT COLLES FRACTURE WITH SHORT ARM CAST  . EXTERNAL FIXATION LEG Right 06/02/2018   Procedure: EXTERNAL  FIXATION LEG;  Surgeon: Leandrew Koyanagi, MD;  Location: [facility];  Service: Orthopedics;  Laterality: Right;  . EXTERNAL FIXATION REMOVAL Right 07/01/2018   Procedure: REMOVAL EXTERNAL FIXATION LEG;  Surgeon: Leandrew Koyanagi, MD;  Location: [facility];  Service: Orthopedics;  Laterality: Right;  . HARDWARE REMOVAL Left 05/05/2014   Procedure: HARDWARE REMOVAL LEFT HIP;  Surgeon: Latanya Maudlin, MD;  [facility];  Service: Orthopedics;  Laterality: Left;  REMOVAL BIOMET 6.5-8.0 CANNULATED SCREW  . HIP ARTHROPLASTY Left 05/05/2014   Procedure: ARTHROPLASTY OPEN REDUCTION INTERNAL FIXATION LEFT HIP AND REMOVAL OF TWO CANNULATED SCREW;  Surgeon: Latanya Maudlin, MD;  [facility];  Service: Orthopedics;  Laterality: Left;  . HIP PINNING,CANNULATED  07/16/2011   Procedure: CANNULATED HIP PINNING;  Surgeon: Gearlean Alf, MD;  [facility];  Service: Orthopedics;  Laterality: Left;  . HIP PINNING,CANNULATED Left 04/09/2012   Procedure: CANNULATED HIP PINNING AND HARDWARE REVISION;  Surgeon: Gearlean Alf, MD;  [facility];  Service: Orthopedics;  Laterality: Left;  . I & D EXTREMITY Left 11/16/2014   Procedure:  DEBRIDEMENT OF LEFT FOOT POSSIBLE APPLICATION OF INTEGRIA AND VAC ;  Surgeon: Irene Limbo, MD;  Location: [facility];  Service: Plastics;  Laterality: Left;  . I & D EXTREMITY Right 07/01/2018   Procedure: RIGHT ANKLE EX FIX REMOVAL, IRRIGATION AND DEBRIDEMENT AND SHORT LEG CAST;  Surgeon: Leandrew Koyanagi, MD;  Location: [facility];  Service: Orthopedics;  Laterality: Right;  . PERIPHERAL VASCULAR CATHETERIZATION N/A 11/15/2014   Procedure: Abdominal Aortogram;  Surgeon: Conrad Wren, MD;  Location: [facility];  Service: Cardiovascular;  Laterality: N/A;  . PERIPHERAL VASCULAR CATHETERIZATION  11/15/2014   Procedure: Lower Extremity Angiography;  Surgeon: Conrad Lamont, MD;  Location: [facility];  Service: Cardiovascular;;  . PERIPHERAL VASCULAR CATHETERIZATION Left 11/15/2014    Procedure: Peripheral Vascular Intervention;  Surgeon: Conrad Medora, MD;  Location: [facility];  Service: Cardiovascular;  Laterality: Left;  popliteal artery stenting    Family History  Problem Relation Age of Onset  . Diabetes Mother   . Hypertension Mother   . Vision loss Mother   . Heart disease Mother   . Hypertension Father   . Thyroid disease Neg Hx     Social History:  reports that she quit smoking about 6 months ago. Her smoking use included e-cigarettes and cigarettes. She started smoking about 37 years ago. She has a 57.00 pack-year smoking history. She has never used smokeless tobacco. She reports that she does not drink alcohol or use drugs.  Allergies:  Allergies  Allergen Reactions  . Cortizone-10 [Hydrocortisone] Rash    Per patient, given local injection at knee and developed  rash at local site.     Medications: I have reviewed the patient's current medications.  Results for orders placed or performed during the hospital encounter of 12/24/18 (from the past 48 hour(s))  CBC with Differential     Status: Abnormal   Collection Time: 12/24/18  1:00 PM  Result Value Ref Range   WBC 11.6 (H) 4.0 - 10.5 K/uL   RBC 5.33 (H) 3.87 - 5.11 MIL/uL   Hemoglobin 12.8 12.0 - 15.0 g/dL   HCT 43.2 36.0 - 46.0 %   MCV 81.1 80.0 - 100.0 fL   MCH 24.0 (L) 26.0 - 34.0 pg   MCHC 29.6 (L) 30.0 - 36.0 g/dL   RDW 22.6 (H) 11.5 - 15.5 %   Platelets 296 150 - 400 K/uL   nRBC 0.0 0.0 - 0.2 %   Neutrophils Relative % 90 %   Neutro Abs 10.4 (H) 1.7 - 7.7 K/uL   Lymphocytes Relative 8 %   Lymphs Abs 0.9 0.7 - 4.0 K/uL   Monocytes Relative 1 %   Monocytes Absolute 0.1 0.1 - 1.0 K/uL   Eosinophils Relative 0 %   Eosinophils Absolute 0.0 0.0 - 0.5 K/uL   Basophils Relative 1 %   Basophils Absolute 0.1 0.0 - 0.1 K/uL   nRBC 0 0 /100 WBC   Abs Immature Granulocytes 0.00 0.00 - 0.07 K/uL   Polychromasia PRESENT     Comment: Performed at [facility], [address],  [place], [place] [address]  Comprehensive metabolic panel     Status: Abnormal   Collection Time: 12/24/18  1:00 PM  Result Value Ref Range   Sodium 140 135 - 145 mmol/L   Potassium 4.2 3.5 - 5.1 mmol/L   [place] 99 98 - 111 mmol/L   CO2 27 22 - 32 mmol/L   Glucose, Bld 196 (H) 70 - 99 mg/dL   BUN 9 6 - 20 mg/dL   Creatinine, Ser 1.33 (H) 0.44 - 1.00 mg/dL   Calcium 8.8 (L) 8.9 - 10.3 mg/dL   Total Protein 7.0 6.5 - 8.1 g/dL   Albumin 2.4 (L) 3.5 - 5.0 g/dL   AST 21 15 - 41 U/L   ALT 15 0 - 44 U/L   Alkaline Phosphatase 187 (H) 38 - 126 U/L   Total Bilirubin 1.0 0.3 - 1.2 mg/dL   GFR calc non Af Amer 45 (L) >60 mL/min   GFR calc Af Amer 52 (L) >60 mL/min   Anion gap 14 5 - 15    Comment: Performed at [facility] [address], [place], [place] [address]  Lactic acid, plasma     Status: None   Collection Time: 12/24/18  1:00 PM  Result Value Ref Range   Lactic Acid, Venous 1.7 0.5 - 1.9 mmol/L    Comment: Performed at [facility] [address], [place], [place] [address]  Troponin I (High Sensitivity)     Status: None   Collection Time: 12/24/18  1:00 PM  Result Value Ref Range   Troponin I (High Sensitivity) 15 <18 ng/L    Comment: (NOTE) Elevated high sensitivity troponin I (hsTnI) values and significant  changes across serial measurements may suggest ACS but many other  chronic and acute conditions are known to elevate hsTnI results.  Refer to the "Links" section for chest pain algorithms and additional  guidance. Performed at [facility], [place] [address], [place], [place] [address]   POC SARS  Coronavirus 2 Ag-ED - Nasal Swab (BD Veritor Kit)     Status: None   Collection Time: 12/24/18  1:33 PM  Result Value Ref Range   SARS Coronavirus 2 Ag NEGATIVE NEGATIVE    Comment: (NOTE) SARS-CoV-2 antigen NOT DETECTED.  Negative results are presumptive.  Negative results do not preclude SARS-CoV-2 infection and should not be used as the sole basis  for treatment or other patient management decisions, including infection  control decisions, particularly in the presence of clinical signs and  symptoms consistent with COVID-19, or in those who have been in contact with the virus.  Negative results must be combined with clinical observations, patient history, and epidemiological information. The expected result is Negative. Fact Sheet for Patients: PodPark.tn Fact Sheet for Healthcare Providers: GiftContent.is This test is not yet approved or cleared by the [place] FDA and  has been authorized for detection and/or diagnosis of SARS-CoV-2 by FDA under an Emergency Use Authorization (EUA).  This EUA will remain in effect (meaning this test can be used) for the duration of  the COVID-19 de claration under Section 564(b)(1) of the Act, 21 U.S.C. section 360bbb-3(b)(1), unless the authorization is terminated or revoked sooner.     DG Chest Port 1 View  Result Date: 12/24/2018 CLINICAL DATA:  Fall EXAM: PORTABLE CHEST 1 VIEW COMPARISON:  Chest x-ray dated 06/15/2018. FINDINGS: Stable cardiomegaly. Lungs are clear. No pleural effusion or pneumothorax is seen. Subtle deformity within the posterior LEFT fifth rib, not seen on previous exams, suspicious for acute rib fracture. Additional deformity of the posterior LEFT seventh rib is likely old and seen on earlier chest CT of 01/02/2018. IMPRESSION: 1. No active disease. No evidence of pneumonia or pulmonary edema. 2. Slightly displaced fracture of the posterior LEFT fifth rib, most likely acute. Electronically Signed   By: Franki Cabot M.D.   On: 12/24/2018 14:08   DG HIP PORT UNILAT WITH PELVIS 1V RIGHT  Result Date: 12/24/2018 CLINICAL DATA:  Right hip pain secondary to a fall. EXAM: DG HIP (WITH OR WITHOUT PELVIS) 1V PORT RIGHT COMPARISON:  Radiographs dated 05/09/2014 FINDINGS: There is an abnormal configuration of the proximal  right femur suggesting an intertrochanteric fracture of the proximal right femur but this is not definitive does of the hip flexion. There is also a deformity of the left ischium and ilium and medial wall of the left acetabulum which appears different than the prior radiograph of 04/03/2018. Old healed fracture of the proximal left femur. IMPRESSION: Possible left pelvic bone fractures. Probable intertrochanteric fracture of the proximal right femur as described above. CT scan of the pelvis and left hip recommended for further evaluation. Electronically Signed   By: Lorriane Shire M.D.   On: 12/24/2018 14:28    Review of Systems  HENT: Negative for ear discharge, ear pain, hearing loss and tinnitus.   Eyes: Negative for photophobia and pain.  Respiratory: Negative for cough and shortness of breath.   Cardiovascular: Negative for chest pain.  Gastrointestinal: Negative for abdominal pain, nausea and vomiting.  Genitourinary: Negative for dysuria, flank pain, frequency and urgency.  Musculoskeletal: Positive for arthralgias (Right hip). Negative for back pain, myalgias and neck pain.  Neurological: Negative for dizziness and headaches.  Hematological: Does not bruise/bleed easily.  Psychiatric/Behavioral: The patient is not nervous/anxious.    Blood pressure 130/68, pulse 76, temperature (!) 96.8 F (36 C), temperature source Temporal, resp. rate 20, height _0  (1.778 m), weight 106.6 kg, last menstrual period 11/16/2010, SpO2  96 %. Physical Exam  Constitutional: She appears well-developed and well-nourished. No distress.  HENT:  Head: Normocephalic and atraumatic.  Eyes: Conjunctivae are normal. Right eye exhibits no discharge. Left eye exhibits no discharge. No scleral icterus.  Cardiovascular: Normal rate and regular rhythm.  Respiratory: Effort normal. No respiratory distress.  Musculoskeletal:     Cervical back: Normal range of motion.     Comments: RLE No traumatic wounds,  ecchymosis, or rash  Mod TTP hip  No knee or ankle effusion  Knee stable to varus/ valgus and anterior/posterior stress  Sens DPN, SPN, TN absent  Motor EHL, ext, flex, evers 5/5  DP 2+, PT 2+, No significant edema  Neurological: She is alert.  Skin: Skin is warm and dry. She is not diaphoretic.  Psychiatric: She has a normal mood and affect. Her behavior is normal.    Assessment/Plan: Right hip fx -- Plan IMN tomorrow by Dr. Erlinda Hong. Please keep NPO after MN. Pelvic fxs -- Will get CT to make sure the acetabulum is not impacted. Multiple medical problems including COPD, CAP, and DM -- per medicine who will admit, manage, and clear. Appreciate their help.    Lisette Abu, PA-C Orthopedic Surgery [phone] 12/24/2018, 2:53 PM

## 2018-12-24 NOTE — Progress Notes (Signed)
Echocardiogram 2D Echocardiogram has been performed.  [name] 12/24/2018, 9:15 PM   Notified Dr. Harrell Gave at 9:16pm

## 2018-12-24 NOTE — H&P (Signed)
History and Physical    [name] VVZ:[id number] DOB: 1965/04/15 DOA: 12/24/2018  PCP: [name], PA-C   Patient coming from: Home  I have personally briefly reviewed patient's old medical records in [place]  Chief Complaint: Presyncope and fall on the left side  HPI: [name] is a 53 y.o. female with medical history significant of asthma, COPD, stage IV CKD, morbid obesity, depression, anxiety, hypertension, insulin-dependent diabetes mellitus, left-sided DKA, diabetic neuropathy, GERD, HIV, hyperlipidemia was at home today and was trying to get up from the commode reports feeling dizzy and fell on her right side.  She reports pain on the right hip.  On arrival to ED she underwent x-rays of the right hip and was found to have a Possible left pelvic bone fractures. Probable intertrochanteric fracture of the proximal right femur. As per the patient, she denied hitting her head or syncope. She reports she was trying to get up from the commode after BM, felt dizzy and fell on the right side. She denies any chest pain or sob, fever, chills, nausea, vomiting , abdominal pain, headache, tingling or numbness, diarrhea, dysuria, hematochezia, or hematemesis. She denies any blurry vision at this time.  Orthopedics consulted and she was referred to medical service for admission.    ED Course:on arrival to ED she was found tobe afebrile, hypotensive with bp 70/50'S, . LABS revealed sodium of 140, creatinine of 1.3, alk phos of 187, wbc of 11.6, normal lactate.  covid 19 negative.  CXR does not show active disease. But displaced left fracture of the posterior LEFT fifth rib, most likely acute. Blood cultures ordered and pending.   X rays of the hip show Possible left pelvic bone fractures.  Probable intertrochanteric fracture of the proximal right femur    Review of Systems: As per HPI otherwise "All others reviewed and are negative,"  Past Medical History:    Diagnosis Date  . Acute metabolic encephalopathy 5/44/9201  . Acute on chronic respiratory failure with hypoxia ([place]) 05/19/2016  . Anxiety   . ARF (acute renal failure) ([place]) 05/03/2014  . Arthritis    LEFT HIP  . Asthma    HOSPITALIZED WITH EXCERBATION OF ASTHMA - AND BRONCHITIS AND THE FLU DEC 2013  . Asthma exacerbation attacks 12/28/2011  . Bronchitis 12/28/2011  . CAP (community acquired pneumonia) 07/25/2012  . Chronic kidney disease (CKD), stage IV (severe) ([place]) 11/10/2014  . Class 3 obesity due to excess calories with serious comorbidity and body mass index (BMI) of 40.0 to 44.9 in adult   . Colles' fracture of left radius   . COPD (chronic obstructive pulmonary disease) ([place])   . Depression   . Diabetes mellitus    ON INSULIN AND ORAL MEDICATIONS  . Diabetes mellitus type 2 with complications, uncontrolled ([place]) 04/19/2008   Qualifier: Diagnosis of  By: Tomma Lightning MD, Claiborne Billings     . Diabetic foot infection ([place]) 11/09/2014  . Diabetic ketoacidosis without coma associated with type 2 diabetes mellitus ([place])   . Diabetic neuropathy ([place]) 11/10/2014  . Diarrhea 08/30/2008   Qualifier: Diagnosis of  By: Tomma Lightning MD, Claiborne Billings    . Diastolic dysfunction 00/07/1217  . Difficult intravenous access   . DKA, type 2 ([place]) 04/04/2016  . Dyslipidemia 12/23/2008   Qualifier: Diagnosis of  By: Tomma Lightning MD, Claiborne Billings    . Essential hypertension 04/19/2008   Qualifier: Diagnosis of  By: Tomma Lightning MD, Claiborne Billings    . FATIGUE 07/12/2008   Qualifier: Diagnosis of  By: Tomma Lightning MD, Claiborne Billings    . Femoral neck fracture ([place]) 07/16/2011  . Femur fracture, left ([place]) 07/08/2014  . Fever   . Fracture of distal femur ([place]) 07/08/2014  . GERD (gastroesophageal reflux disease)   . Hereditary and idiopathic peripheral neuropathy 04/19/2008   Qualifier: Diagnosis of  By: Tomma Lightning MD, Claiborne Billings    . Hip fracture, left ([place]) 07/16/2011  . Hip pain   . HIV infection ([place]) 2000  . Human immunodeficiency virus (HIV) disease ([place]) 04/19/2008    [id number] +   . Hyperlipidemia   . Hypertension   . Hyponatremia 12/29/2011  . Influenza A 12/29/2011  . Influenza B   . Insomnia 06/12/2016  . Left hip pain 05/03/2014  . Mood disorder ([place]) 04/19/2008   Qualifier: Diagnosis of  By: Tomma Lightning MD, Claiborne Billings    . Neuropathy    NEUROPATHY HANDS AND FEET  . NSTEMI (non-ST elevated myocardial infarction) ([place]) 05/19/2016  . Obesity hypoventilation syndrome ([place])   . Osteoporosis 07/08/2014  . Pain    SEVERE PAIN LEFT HIP - HX OF LEFT HIP PINNING JULY 2013  . Perimenopausal symptoms 02/13/2012   LMP around 2011. On estrace and provera since around 2012 for hot flashes, mood swings. Estrace 2 mg daily, Provera 2.5 mg for 5 days each month.   . Repeated falls 07/30/2011  . Sepsis ([place]) 04/04/2016  . Shortness of breath    ALLERGIES ARE "ACTING UP"  . SOB (shortness of breath)   . THRUSH 05/20/2008   Qualifier: Diagnosis of  By: Tomma Lightning MD, Claiborne Billings      . Tobacco use disorder 04/14/2010    Past Surgical History:  Procedure Laterality Date  . AMPUTATION Left 11/18/2014   Procedure: LEFT BELOW KNEE AMPUTATION;  Surgeon: Leandrew Koyanagi, MD;  Location: [facility];  Service: Orthopedics;  Laterality: Left;  . CAST APPLICATION Left 05/09/5636   Procedure: CAST APPLICATION (FIBERGLASS);  Surgeon: Latanya Maudlin, MD;  Location[facility];  Service: Orthopedics;  Laterality: Left;  CLOSED REDUCTION OF LEFT COLLES FRACTURE WITH SHORT ARM CAST  . EXTERNAL FIXATION LEG Right 06/02/2018   Procedure: EXTERNAL FIXATION LEG;  Surgeon: Leandrew Koyanagi, MD;  Location: [facility];  Service: Orthopedics;  Laterality: Right;  . EXTERNAL FIXATION REMOVAL Right 07/01/2018   Procedure: REMOVAL EXTERNAL FIXATION LEG;  Surgeon: Leandrew Koyanagi, MD;  Location: [facility];  Service: Orthopedics;  Laterality: Right;  . HARDWARE REMOVAL Left 05/05/2014   Procedure: HARDWARE REMOVAL LEFT HIP;  Surgeon: Latanya Maudlin, MD;  [facility];  Service: Orthopedics;  Laterality: Left;  REMOVAL BIOMET 6.5-8.0 CANNULATED  SCREW  . HIP ARTHROPLASTY Left 05/05/2014   Procedure: ARTHROPLASTY OPEN REDUCTION INTERNAL FIXATION LEFT HIP AND REMOVAL OF TWO CANNULATED SCREW;  Surgeon: Latanya Maudlin, MD;  [facility];  Service: Orthopedics;  Laterality: Left;  . HIP PINNING,CANNULATED  07/16/2011   Procedure: CANNULATED HIP PINNING;  Surgeon: Gearlean Alf, MD;  [facility];  Service: Orthopedics;  Laterality: Left;  . HIP PINNING,CANNULATED Left 04/09/2012   Procedure: CANNULATED HIP PINNING AND HARDWARE REVISION;  Surgeon: Gearlean Alf, MD;  [facility];  Service: Orthopedics;  Laterality: Left;  . I & D EXTREMITY Left 11/16/2014   Procedure:  DEBRIDEMENT OF LEFT FOOT POSSIBLE APPLICATION OF INTEGRIA AND VAC ;  Surgeon: Irene Limbo, MD;  Location: [facility];  Service: Plastics;  Laterality: Left;  . I & D EXTREMITY Right 07/01/2018   Procedure: RIGHT ANKLE EX FIX REMOVAL, IRRIGATION AND  DEBRIDEMENT AND SHORT LEG CAST;  Surgeon: Leandrew Koyanagi, MD;  Location: [facility];  Service: Orthopedics;  Laterality: Right;  . PERIPHERAL VASCULAR CATHETERIZATION N/A 11/15/2014   Procedure: Abdominal Aortogram;  Surgeon: Conrad Honolulu, MD;  Location: [facility];  Service: Cardiovascular;  Laterality: N/A;  . PERIPHERAL VASCULAR CATHETERIZATION  11/15/2014   Procedure: Lower Extremity Angiography;  Surgeon: Conrad Alta, MD;  Location: [facility];  Service: Cardiovascular;;  . PERIPHERAL VASCULAR CATHETERIZATION Left 11/15/2014   Procedure: Peripheral Vascular Intervention;  Surgeon: Conrad La Porte, MD;  Location: [facility];  Service: Cardiovascular;  Laterality: Left;  popliteal artery stenting   Social history.   reports that she quit smoking about 6 months ago. Her smoking use included e-cigarettes and cigarettes. She started smoking about 37 years ago. She has a 57.00 pack-year smoking history. She has never used smokeless tobacco. She reports that she does not drink alcohol or use drugs.  Allergies   Allergen Reactions  . Cortizone-10 [Hydrocortisone] Rash    Per patient, given local injection at knee and developed rash at local site.     Family History  Problem Relation Age of Onset  . Diabetes Mother   . Hypertension Mother   . Vision loss Mother   . Heart disease Mother   . Hypertension Father   . Thyroid disease Neg Hx   family history reviewed.  See above.   Prior to Admission medications   Medication Sig Start Date End Date Taking? Authorizing Provider  acetaminophen (TYLENOL) 325 MG tablet Take 1-2 tablets (325-650 mg total) by mouth every 6 (six) hours as needed for mild pain (pain score 1-3 or temp > 100.5). 07/04/18  Yes Emokpae, Courage, MD  albuterol (PROVENTIL HFA;VENTOLIN HFA) 108 (90 Base) MCG/ACT inhaler INHALE 2 PUFFS INTO THE LUNGS EVERY 6 HOURS AS NEEDED FOR WHEEZING OR SHORTNESS OF BREATH Patient taking differently: Inhale 2 puffs into the lungs every 6 (six) hours as needed for wheezing or shortness of breath.  10/03/17  Yes Medina-Vargas, Monina C, NP  ALPRAZolam (XANAX) 0.5 MG tablet Take 0.5 mg by mouth 3 (three) times daily.   Yes [provider]  bictegravir-emtricitabine-tenofovir AF (BIKTARVY) 50-200-25 MG TABS tablet Take 1 tablet by mouth daily. 07/05/18  Yes Roxan Hockey, MD  Blood Glucose Monitoring Suppl (ACCU-CHEK AVIVA) device Use to test blood sugar daily 08/04/18  Yes Elayne Snare, MD  empagliflozin (JARDIANCE) 10 MG TABS tablet Take 10 mg by mouth daily. 09/01/18  Yes Elayne Snare, MD  esomeprazole (NEXIUM) 20 MG packet Take 20 mg by mouth daily before breakfast. 09/02/18  Yes Campbell Riches, MD  fluticasone (FLONASE) 50 MCG/ACT nasal spray Place 1 spray into both nostrils daily. 04/08/18  Yes Lauraine Rinne, NP  Fluticasone-Umeclidin-Vilant (TRELEGY ELLIPTA) 100-62.5-25 MCG/INH AEPB Inhale 1 puff into the lungs daily. 10/14/18 01/12/19 Yes Chesley Mires, MD  glucose blood (ACCU-CHEK AVIVA) test strip Use as instructed to check blood sugar 2  times daily. 08/04/18  Yes Elayne Snare, MD  Insulin Glargine, 1 Unit Dial, (TOUJEO SOLOSTAR) 300 UNIT/ML SOPN Inject 50 Units into the skin 2 (two) times a day. Inject 50 units under the skin twice daily.   Yes [provider]  insulin regular human CONCENTRATED (HUMULIN R U-500 KWIKPEN) 500 UNIT/ML kwikpen Inject 40 Units into the skin 2 (two) times daily with a meal.    Yes [provider]  ipratropium-albuterol (DUONEB) 0.5-2.5 (3) MG/3ML SOLN Take 3 mLs  by nebulization every 6 (six) hours. Patient taking differently: Take 3 mLs by nebulization every 6 (six) hours as needed.  01/01/18  Yes Regalado, Belkys A, MD  pregabalin (LYRICA) 225 MG capsule Take 225 mg by mouth 2 (two) times daily.    Yes [provider]  rosuvastatin (CRESTOR) 20 MG tablet Take 1 tablet (20 mg total) by mouth daily. Reported on 05/02/2015 10/03/17  Yes Medina-Vargas, Monina C, NP  sertraline (ZOLOFT) 100 MG tablet Take 100 mg by mouth daily. (Give with 23m tablet for a total of 1568m   Yes [provider]  sertraline (ZOLOFT) 50 MG tablet Take 50 mg by mouth daily. (Give with 10034mablet for a total of 150m93m Yes [provider]    Physical Exam:  Constitutional: NAD, calm, comfortable on 2l it of [place] oxygen.  Vitals:   12/24/18 1515 12/24/18 1530 12/24/18 1545 12/24/18 1600  BP: 113/72 (!) 133/98 134/82 127/64  Pulse: 78 77 80 83  Resp: _0 Temp:      TempSrc:      SpO2: 92% 94% 93% 94%  Weight:      Height:       Eyes: PERRL, lids and conjunctivae normal ENMT: Mucous membranes are dry.  Neck: normal, supple,  Respiratory: clear to auscultation bilaterally, no wheezing, no crackles. Cardiovascular: Regular rate and rhythm, no murmurs / rubs / gallops. Abdomen: no tenderness, no masses palpated. No hepatosplenomegaly. Bowel sounds positive.  Musculoskeletal: left BKA, and right leg internally rotated.  Skin: no rashes, lesions, ulcers. No  induration Neurologic: CN 2-12 grossly intact. Sensation intact Psychiatric:Alert and oriented x 3. Normal mood.     Labs on Admission: I have personally reviewed following labs and imaging studies  CBC: Recent Labs  Lab 12/24/18 1300  WBC 11.6*  NEUTROABS 10.4*  HGB 12.8  HCT 43.2  MCV 81.1  PLT 296 384asic Metabolic Panel: Recent Labs  Lab 12/24/18 1300  NA 140  K 4.2  CL 99  CO2 27  GLUCOSE 196*  BUN 9  CREATININE 1.33*  CALCIUM 8.8*   GFR: Estimated Creatinine Clearance: 63.9 mL/min (A) (by C-G formula based on SCr of 1.33 mg/dL (H)). Liver Function Tests: Recent Labs  Lab 12/24/18 1300  AST 21  ALT 15  ALKPHOS 187*  BILITOT 1.0  PROT 7.0  ALBUMIN 2.4*   No results for input(s): LIPASE, AMYLASE in the last 168 hours. No results for input(s): AMMONIA in the last 168 hours. Coagulation Profile: No results for input(s): INR, PROTIME in the last 168 hours. Cardiac Enzymes: No results for input(s): CKTOTAL, CKMB, CKMBINDEX, TROPONINI in the last 168 hours. BNP (last 3 results) No results for input(s): PROBNP in the last 8760 hours. HbA1C: No results for input(s): HGBA1C in the last 72 hours. CBG: No results for input(s): GLUCAP in the last 168 hours. Lipid Profile: No results for input(s): CHOL, HDL, LDLCALC, TRIG, CHOLHDL, LDLDIRECT in the last 72 hours. Thyroid Function Tests: No results for input(s): TSH, T4TOTAL, FREET4, T3FREE, THYROIDAB in the last 72 hours. Anemia Panel: No results for input(s): VITAMINB12, FOLATE, FERRITIN, TIBC, IRON, RETICCTPCT in the last 72 hours. Urine analysis:    Component Value Date/Time   COLORURINE AMBER (A) 06/05/2018 1900   APPEARANCEUR CLOUDY (A) 06/05/2018 1900   LABSPEC 1.023 06/05/2018 1900   PHURINE 6.0 06/05/2018 1900   GLUCOSEU 50 (A) 06/05/2018 1900   GLUCOSEU 250 (A) 03/16/2015 1612   HGBUR SMALL (  A) 06/05/2018 1900   BILIRUBINUR NEGATIVE 06/05/2018 1900   KETONESUR 20 (A) 06/05/2018 1900    PROTEINUR 100 (A) 06/05/2018 1900   UROBILINOGEN 0.2 03/16/2015 1612   NITRITE POSITIVE (A) 06/05/2018 1900   LEUKOCYTESUR LARGE (A) 06/05/2018 1900    Radiological Exams on Admission: DG Chest Port 1 View  Result Date: 12/24/2018 CLINICAL DATA:  Fall EXAM: PORTABLE CHEST 1 VIEW COMPARISON:  Chest x-ray dated 06/15/2018. FINDINGS: Stable cardiomegaly. Lungs are clear. No pleural effusion or pneumothorax is seen. Subtle deformity within the posterior LEFT fifth rib, not seen on previous exams, suspicious for acute rib fracture. Additional deformity of the posterior LEFT seventh rib is likely old and seen on earlier chest CT of 01/02/2018. IMPRESSION: 1. No active disease. No evidence of pneumonia or pulmonary edema. 2. Slightly displaced fracture of the posterior LEFT fifth rib, most likely acute. Electronically Signed   By: Franki Cabot M.D.   On: 12/24/2018 14:08   DG HIP PORT UNILAT WITH PELVIS 1V RIGHT  Result Date: 12/24/2018 CLINICAL DATA:  Right hip pain secondary to a fall. EXAM: DG HIP (WITH OR WITHOUT PELVIS) 1V PORT RIGHT COMPARISON:  Radiographs dated 05/09/2014 FINDINGS: There is an abnormal configuration of the proximal right femur suggesting an intertrochanteric fracture of the proximal right femur but this is not definitive does of the hip flexion. There is also a deformity of the left ischium and ilium and medial wall of the left acetabulum which appears different than the prior radiograph of 04/03/2018. Old healed fracture of the proximal left femur. IMPRESSION: Possible left pelvic bone fractures. Probable intertrochanteric fracture of the proximal right femur as described above. CT scan of the pelvis and left hip recommended for further evaluation. Electronically Signed   By: Lorriane Shire M.D.   On: 12/24/2018 14:28    EKG: Independently reviewed. Sinus rhythm with RBBB.   Assessment/Plan Active Problems:   Closed RIGHT hip fracture, initial encounter [facility])     Intertrochanteric fracture of the proximal right femur: Admit for further evaluation by orthopedics.  Pain control.  Orthopedics on board.  NPO after midnight.    Dizziness and fall : Possibly secondary to hypotension. Get echocardiogram , troponins have been negative so far.    Hypotension  Resolved with IV fluids.  Probably dehydration.  Lactic acid wnl.  Hold home BP meds.    Diabetes Mellitus complicated by diabetic neuropathy and CKD. Insulin dependent.  CBG (last 3)  Recent Labs    12/24/18 1744  GLUCAP 164*   Restart her home meds and continue with SSI.  Get Hgba1c.    Hyperlipidemia:  Resume statin.    CKD Baseline creatinine ranging between 1 to 1.4. currently its at 1.3 Creatinine appears to be at baseline.   SIRS: ? Hypotensive, with mild leukocytosis on admission.  Get UA.    HIV;  Resume home meds at this time.    COPD and Asthma:  No wheezing heard.  Continue with duo nebs as needed.    Tobacco abuse:  Cessation counseling provided.  Start her on nicotine patch.  UDS ordered.    Left rib fracture:  Pain control and incentive spirometry.    Severity of Illness: The appropriate patient status for this patient is INPATIENT. Inpatient status is judged to be reasonable and necessary in order to provide the required intensity of service to ensure the patient's safety. The patient's presenting symptoms, physical exam findings, and initial radiographic and laboratory data in the context of their  chronic comorbidities is felt to place them at high risk for further clinical deterioration. Furthermore, it is not anticipated that the patient will be medically stable for discharge from the hospital within 2 midnights of admission. The following factors support the patient status of inpatient.   * I certify that at the point of admission it is my clinical judgment that the patient will require inpatient hospital care spanning beyond 2 midnights  from the point of admission due to high intensity of service, high risk for further deterioration and high frequency of surveillance required.*    DVT prophylaxis: Lovenox.  Code Status: full code.  Family Communication:none at bedside, pt did not want me to call family to update.  Disposition Plan: pending further work up.  Consults called: orthopedics.  Admission status: inpatient/ tele.    Hosie Poisson MD [facility]   12/24/2018, 5:08 PM

## 2018-12-24 NOTE — ED Notes (Signed)
Help get patient undress on the monitor patient is resting with nurse at bedside

## 2018-12-24 NOTE — ED Notes (Signed)
2nd unsuccessful attempt  To call report   rn to call me back

## 2018-12-24 NOTE — ED Notes (Signed)
Report given to rn on 5n 

## 2018-12-24 NOTE — ED Provider Notes (Signed)
[facility] EMERGENCY DEPARTMENT Provider Note   CSN: [id number] Arrival date & time: 12/24/18  1228     History Chief Complaint  Patient presents with  . Fall    [name] is a 53 y.o. female hx of COPD, CAP, DM, here presenting with syncope, fall.  Patient states that she was on the commode and felt lightheaded and dizzy and fell onto the right hip.  She has severe right hip pain afterwards .  She was noted to be hypotensive 70s to 80s per EMS. She was given 200 ml prior to arrival. Denies any fever or chills, just has poor appetite   The history is provided by the patient.       Past Medical History:  Diagnosis Date  . Acute metabolic encephalopathy 2/99/2426  . Acute on chronic respiratory failure with hypoxia ([place]) 05/19/2016  . Anxiety   . ARF (acute renal failure) ([place]) 05/03/2014  . Arthritis    LEFT HIP  . Asthma    HOSPITALIZED WITH EXCERBATION OF ASTHMA - AND BRONCHITIS AND THE FLU DEC 2013  . Asthma exacerbation attacks 12/28/2011  . Bronchitis 12/28/2011  . CAP (community acquired pneumonia) 07/25/2012  . Chronic kidney disease (CKD), stage IV (severe) ([place]) 11/10/2014  . Class 3 obesity due to excess calories with serious comorbidity and body mass index (BMI) of 40.0 to 44.9 in adult   . Colles' fracture of left radius   . COPD (chronic obstructive pulmonary disease) ([place])   . Depression   . Diabetes mellitus    ON INSULIN AND ORAL MEDICATIONS  . Diabetes mellitus type 2 with complications, uncontrolled ([place]) 04/19/2008   Qualifier: Diagnosis of  By: Tomma Lightning MD, Claiborne Billings     . Diabetic foot infection ([place]) 11/09/2014  . Diabetic ketoacidosis without coma associated with type 2 diabetes mellitus ([place])   . Diabetic neuropathy ([place]) 11/10/2014  . Diarrhea 08/30/2008   Qualifier: Diagnosis of  By: Tomma Lightning MD, Claiborne Billings    . Diastolic dysfunction 83/04/1960  . Difficult intravenous access   . DKA, type 2 ([place]) 04/04/2016  . Dyslipidemia 12/23/2008     Qualifier: Diagnosis of  By: Tomma Lightning MD, Claiborne Billings    . Essential hypertension 04/19/2008   Qualifier: Diagnosis of  By: Tomma Lightning MD, Claiborne Billings    . FATIGUE 07/12/2008   Qualifier: Diagnosis of  By: Tomma Lightning MD, Claiborne Billings    . Femoral neck fracture ([place]) 07/16/2011  . Femur fracture, left ([place]) 07/08/2014  . Fever   . Fracture of distal femur ([place]) 07/08/2014  . GERD (gastroesophageal reflux disease)   . Hereditary and idiopathic peripheral neuropathy 04/19/2008   Qualifier: Diagnosis of  By: Tomma Lightning MD, Claiborne Billings    . Hip fracture, left ([place]) 07/16/2011  . Hip pain   . HIV infection ([place]) 2000  . Human immunodeficiency virus (HIV) disease ([place]) 04/19/2008   [id number] +   . Hyperlipidemia   . Hypertension   . Hyponatremia 12/29/2011  . Influenza A 12/29/2011  . Influenza B   . Insomnia 06/12/2016  . Left hip pain 05/03/2014  . Mood disorder ([place]) 04/19/2008   Qualifier: Diagnosis of  By: Tomma Lightning MD, Claiborne Billings    . Neuropathy    NEUROPATHY HANDS AND FEET  . NSTEMI (non-ST elevated myocardial infarction) ([place]) 05/19/2016  . Obesity hypoventilation syndrome ([place])   . Osteoporosis 07/08/2014  . Pain    SEVERE PAIN LEFT HIP - HX OF LEFT HIP PINNING JULY 2013  . Perimenopausal symptoms 02/13/2012  LMP around 2011. On estrace and provera since around 2012 for hot flashes, mood swings. Estrace 2 mg daily, Provera 2.5 mg for 5 days each month.   . Repeated falls 07/30/2011  . Sepsis ([place]) 04/04/2016  . Shortness of breath    ALLERGIES ARE "ACTING UP"  . SOB (shortness of breath)   . THRUSH 05/20/2008   Qualifier: Diagnosis of  By: Tomma Lightning MD, Claiborne Billings      . Tobacco use disorder 04/14/2010    Patient Active Problem List   Diagnosis Date Noted  . Closed right ankle fracture 07/02/2018  . Anemia of chronic disease 07/02/2018  . Displaced bimalleolar fracture of right ankle, closed, initial encounter 06/02/2018  . Avascular necrosis of bone of left hip ([place]) 04/03/2018  . Mucopurulent chronic bronchitis ([place]) 03/11/2018   . Allergic rhinitis 03/11/2018  . Anxiety and depression 12/29/2017  . Acute respiratory failure with hypoxia ([place]) 12/28/2017  . Syncope 08/29/2017  . DKA (diabetic ketoacidoses) ([place]) 03/02/2017  . COPD exacerbation ([place]) 02/23/2017  . Acute on chronic respiratory failure with hypoxia and hypercapnia ([place]) 08/19/2016  . Leukocytosis 08/19/2016  . CKD (chronic kidney disease), stage III ([place]) 08/19/2016  . Altered mental status 08/19/2016  . Asthma exacerbation 06/18/2016  . Insomnia 06/12/2016  . Difficult intravenous access   . Acute on chronic respiratory failure with hypoxia ([place]) 05/19/2016  . NSTEMI (non-ST elevated myocardial infarction) ([place]) 05/19/2016  . Acute metabolic encephalopathy 76/19/5093  . Hyperlipidemia   . Class 3 obesity due to excess calories with serious comorbidity and body mass index (BMI) of 40.0 to 44.9 in adult   . Obesity hypoventilation syndrome ([place])   . Diastolic dysfunction 26/71/2458  . Diabetic neuropathy ([place]) 11/10/2014  . Fracture of distal femur ([place]) 07/08/2014  . Osteoporosis 07/08/2014  . Femur fracture, left ([place]) 07/08/2014  . Colles' fracture of left radius   . Acute kidney injury superimposed on CKD ([place]) 05/03/2014  . Left hip pain 05/03/2014  . Perimenopausal symptoms 02/13/2012  . Hyponatremia 12/29/2011  . Asthma exacerbation attacks 12/28/2011  . Bronchitis 12/28/2011  . Repeated falls 07/30/2011  . Hip fracture, left ([place]) 07/16/2011  . Femoral neck fracture ([place]) 07/16/2011  . Tobacco abuse 04/14/2010  . Hyperlipidemia associated with type 2 diabetes mellitus ([place]) 12/23/2008  . COPD (chronic obstructive pulmonary disease) ([place]) 11/29/2008  . THRUSH 05/20/2008  . Human immunodeficiency virus (HIV) disease ([place]) 04/19/2008  . Diabetes mellitus type 2 with complications, uncontrolled ([place]) 04/19/2008  . Mood disorder ([place]) 04/19/2008  . Hereditary and idiopathic peripheral neuropathy 04/19/2008  . Hypertension associated  with diabetes ([place]) 04/19/2008    Past Surgical History:  Procedure Laterality Date  . AMPUTATION Left 11/18/2014   Procedure: LEFT BELOW KNEE AMPUTATION;  Surgeon: Leandrew Koyanagi, MD;  Location: [facility];  Service: Orthopedics;  Laterality: Left;  . CAST APPLICATION Left 0/09/9831   Procedure: CAST APPLICATION (FIBERGLASS);  Surgeon: Latanya Maudlin, MD;  Location: [facility];  Service: Orthopedics;  Laterality: Left;  CLOSED REDUCTION OF LEFT COLLES FRACTURE WITH SHORT ARM CAST  . EXTERNAL FIXATION LEG Right 06/02/2018   Procedure: EXTERNAL FIXATION LEG;  Surgeon: Leandrew Koyanagi, MD;  Location: [facility];  Service: Orthopedics;  Laterality: Right;  . EXTERNAL FIXATION REMOVAL Right 07/01/2018   Procedure: REMOVAL EXTERNAL FIXATION LEG;  Surgeon: Leandrew Koyanagi, MD;  Location: [facility];  Service: Orthopedics;  Laterality: Right;  . HARDWARE REMOVAL Left 05/05/2014   Procedure: HARDWARE REMOVAL LEFT HIP;  Surgeon: Latanya Maudlin,  MD;  Location: [facility];  Service: Orthopedics;  Laterality: Left;  REMOVAL BIOMET 6.5-8.0 CANNULATED SCREW  . HIP ARTHROPLASTY Left 05/05/2014   Procedure: ARTHROPLASTY OPEN REDUCTION INTERNAL FIXATION LEFT HIP AND REMOVAL OF TWO CANNULATED SCREW;  Surgeon: Latanya Maudlin, MD;  [facility];  Service: Orthopedics;  Laterality: Left;  . HIP PINNING,CANNULATED  07/16/2011   Procedure: CANNULATED HIP PINNING;  Surgeon: Gearlean Alf, MD;  [facility];  Service: Orthopedics;  Laterality: Left;  . HIP PINNING,CANNULATED Left 04/09/2012   Procedure: CANNULATED HIP PINNING AND HARDWARE REVISION;  Surgeon: Gearlean Alf, MD;  [facility];  Service: Orthopedics;  Laterality: Left;  . I & D EXTREMITY Left 11/16/2014   Procedure:  DEBRIDEMENT OF LEFT FOOT POSSIBLE APPLICATION OF INTEGRIA AND VAC ;  Surgeon: Irene Limbo, MD;  Location: [facility];  Service: Plastics;  Laterality: Left;  . I & D EXTREMITY Right 07/01/2018   Procedure: RIGHT ANKLE EX FIX REMOVAL, IRRIGATION AND DEBRIDEMENT AND  SHORT LEG CAST;  Surgeon: Leandrew Koyanagi, MD;  Location: [facility];  Service: Orthopedics;  Laterality: Right;  . PERIPHERAL VASCULAR CATHETERIZATION N/A 11/15/2014   Procedure: Abdominal Aortogram;  Surgeon: Conrad Mass[place], MD;  Location: [facility];  Service: Cardiovascular;  Laterality: N/A;  . PERIPHERAL VASCULAR CATHETERIZATION  11/15/2014   Procedure: Lower Extremity Angiography;  Surgeon: Conrad Franklin, MD;  Location: [facility];  Service: Cardiovascular;;  . PERIPHERAL VASCULAR CATHETERIZATION Left 11/15/2014   Procedure: Peripheral Vascular Intervention;  Surgeon: Conrad Challenge-[place], MD;  Location: [facility];  Service: Cardiovascular;  Laterality: Left;  popliteal artery stenting     OB History    Gravida  2   Para  1   Term  1   Preterm  0   AB  1   Living  1     SAB  1   TAB      Ectopic  0   Multiple  0   Live Births              Family History  Problem Relation Age of Onset  . Diabetes Mother   . Hypertension Mother   . Vision loss Mother   . Heart disease Mother   . Hypertension Father   . Thyroid disease Neg Hx     Social History   Tobacco Use  . Smoking status: Former Smoker    Packs/day: 1.50    Years: 38.00    Pack years: 57.00    Types: E-cigarettes, Cigarettes    Start date: 03/10/1981    Quit date: 06/01/2018    Years since quitting: 0.5  . Smokeless tobacco: Never Used  . Tobacco comment: 3/4 of a pack per day 03/11/18  Substance Use Topics  . Alcohol use: No    Alcohol/week: 0.0 standard drinks    Comment: no drink since 2008  . Drug use: No    Comment: hx of crack/cocaine, clean 8 years    Home Medications Prior to Admission medications   Medication Sig Start Date End Date Taking? Authorizing Provider  acetaminophen (TYLENOL) 325 MG tablet Take 1-2 tablets (325-650 mg total) by mouth every 6 (six) hours as needed for mild pain (pain score 1-3 or temp > 100.5). 07/04/18   Roxan Hockey, MD  albuterol (PROVENTIL  HFA;VENTOLIN HFA) 108 (90 Base) MCG/ACT inhaler INHALE 2 PUFFS INTO THE LUNGS EVERY 6 HOURS AS NEEDED FOR WHEEZING OR SHORTNESS OF BREATH 10/03/17  Medina-Vargas, Monina C, NP  ALPRAZolam (XANAX) 0.5 MG tablet Take 0.5 mg by mouth 3 (three) times daily.    [provider]  bictegravir-emtricitabine-tenofovir AF (BIKTARVY) 50-200-25 MG TABS tablet Take 1 tablet by mouth daily. 07/05/18   Roxan Hockey, MD  Blood Glucose Monitoring Suppl (ACCU-CHEK AVIVA) device Use to test blood sugar daily 08/04/18   Elayne Snare, MD  empagliflozin (JARDIANCE) 10 MG TABS tablet Take 10 mg by mouth daily. 09/01/18   Elayne Snare, MD  esomeprazole (NEXIUM) 20 MG packet Take 20 mg by mouth daily before breakfast. 09/02/18   Campbell Riches, MD  fluticasone (FLONASE) 50 MCG/ACT nasal spray Place 1 spray into both nostrils daily. 04/08/18   Lauraine Rinne, NP  Fluticasone-Umeclidin-Vilant (TRELEGY ELLIPTA) 100-62.5-25 MCG/INH AEPB Inhale 1 puff into the lungs daily. 10/14/18 01/12/19  Chesley Mires, MD  glucose blood (ACCU-CHEK AVIVA) test strip Use as instructed to check blood sugar 2 times daily. 08/04/18   Elayne Snare, MD  HYDROcodone-acetaminophen (NORCO/VICODIN) 5-325 MG tablet Take 2 tablets by mouth 2 (two) times a day. Take 2 tablets by mouth 2 times daily.    [provider]  Insulin Glargine, 1 Unit Dial, (TOUJEO SOLOSTAR) 300 UNIT/ML SOPN Inject 50 Units into the skin 2 (two) times a day. Inject 50 units under the skin twice daily.    [provider]  insulin regular human CONCENTRATED (HUMULIN R U-500 KWIKPEN) 500 UNIT/ML kwikpen Inject into the skin. Inject 40 units under the skin 2-3 times daily before meals.    [provider]  ipratropium-albuterol (DUONEB) 0.5-2.5 (3) MG/3ML SOLN Take 3 mLs by nebulization every 6 (six) hours. Patient taking differently: Take 3 mLs by nebulization every 6 (six) hours as needed.  01/01/18   Regalado, Belkys A, MD  magnesium oxide (MAG-OX) 400 MG  tablet Take 400 mg by mouth daily.    [provider]  methocarbamol (ROBAXIN) 500 MG tablet Take 1 tablet (500 mg total) by mouth 2 (two) times daily as needed. 10/14/18   Aundra Dubin, PA-C  mupirocin ointment (BACTROBAN) 2 % Apply to affected area 08/26/18   Aundra Dubin, PA-C  predniSONE (STERAPRED UNI-PAK 21 TAB) 10 MG (21) TBPK tablet Take as directed 10/14/18   Aundra Dubin, PA-C  pregabalin (LYRICA) 225 MG capsule Take 225 mg by mouth 2 (two) times daily.     [provider]  rosuvastatin (CRESTOR) 20 MG tablet Take 1 tablet (20 mg total) by mouth daily. Reported on 05/02/2015 10/03/17   Medina-Vargas, Monina C, NP  sertraline (ZOLOFT) 100 MG tablet Take 100 mg by mouth daily. (Give with 37m tablet for a total of 1564m    [provider]  sertraline (ZOLOFT) 50 MG tablet Take 50 mg by mouth daily. (Give with 10057mablet for a total of 150m41m  [provider]    Allergies    Cortizone-10 [hydrocortisone]  Review of Systems   Review of Systems  Musculoskeletal:       R hip pain   Neurological: Positive for dizziness.  All other systems reviewed and are negative.   Physical Exam Updated Vital Signs BP 91/65   Pulse 85   Temp (!) 96.8 F (36 C) (Temporal)   Resp 20   Ht _0  (1.778 m)   Wt 106.6 kg   LMP 11/16/2010   SpO2 96%   BMI 33.72 kg/m   Physical Exam Vitals and nursing note reviewed.  Constitutional:  Comments: Chronically ill   HENT:     Head: Normocephalic and atraumatic.     Mouth/Throat:     Mouth: Mucous membranes are moist.  Eyes:     Extraocular Movements: Extraocular movements intact.     Pupils: Pupils are equal, round, and reactive to light.  Cardiovascular:     Rate and Rhythm: Normal rate and regular rhythm.     Pulses: Normal pulses.     Heart sounds: Normal heart sounds.  Pulmonary:     Effort: Pulmonary effort is normal.  Abdominal:     General: Abdomen is flat.  Musculoskeletal:      Cervical back: Normal range of motion.     Comments: R hip deformity and rotated and shortened, L BKA   Skin:    General: Skin is warm.     Capillary Refill: Capillary refill takes less than 2 seconds.  Neurological:     General: No focal deficit present.  Psychiatric:        Mood and Affect: Mood normal.     ED Results / Procedures / Treatments   Labs (all labs ordered are listed, but only abnormal results are displayed) Labs Reviewed  CBC WITH DIFFERENTIAL/PLATELET - Abnormal; Notable for the following components:      Result Value   WBC 11.6 (*)    RBC 5.33 (*)    MCH 24.0 (*)    MCHC 29.6 (*)    RDW 22.6 (*)    All other components within normal limits  CULTURE, BLOOD (ROUTINE X 2)  CULTURE, BLOOD (ROUTINE X 2)  URINE CULTURE  COMPREHENSIVE METABOLIC PANEL  LACTIC ACID, PLASMA  LACTIC ACID, PLASMA  URINALYSIS, ROUTINE W REFLEX MICROSCOPIC  POC SARS CORONAVIRUS 2 AG -  ED  TROPONIN I (HIGH SENSITIVITY)    EKG EKG Interpretation  Date/Time:  Wednesday December 24 2018 12:53:40 EST Ventricular Rate:  84 PR Interval:    QRS Duration: 153 QT Interval:  401 QTC Calculation: 474 R Axis:   -32 Text Interpretation: Sinus rhythm Right bundle branch block No significant change since last tracing Confirmed by Wandra Arthurs [phone]) on 12/24/2018 1:01:11 PM   Radiology No results found.  Procedures Procedures (including critical care time)  Medications Ordered in ED Medications  sodium chloride 0.9 % bolus 1,000 mL (has no administration in time range)  sodium chloride 0.9 % bolus 1,000 mL (1,000 mLs Intravenous New Bag/Given 12/24/18 1305)  fentaNYL (SUBLIMAZE) injection 25 mcg (25 mcg Intravenous Given 12/24/18 1248)  fentaNYL (SUBLIMAZE) injection 25 mcg (25 mcg Intravenous Given 12/24/18 1326)    ED Course  I have reviewed the triage vital signs and the nursing notes.  Pertinent labs & imaging results that were available during my care of the patient were  reviewed by me and considered in my medical decision making (see chart for details).    MDM Rules/Calculators/A&P                      VONCEIL UPSHUR is a 53 y.o. female here with fall.  Patient is hypotensive and has right hip deformity.  I am not sure why she is hypotensive but she states that she has poor appetite recently.  She appears dehydrated but also consider sepsis as well.  Will do sepsis work-up and hydrate patient. Will get right hip x-ray as well.  She did not have any head injury.  2:35 PM Xray showed possible pelvic fracture but there is R intertrochanteric  fracture. Labs unremarkable. Lactate nl. Given IVF and BP up to 125/64. Ortho consulted. Hospitalist to admit.     Final Clinical Impression(s) / ED Diagnoses Final diagnoses:  Fall    Rx / DC Orders ED Discharge Orders    None       Drenda Freeze, MD 12/24/18 1436

## 2018-12-24 NOTE — ED Notes (Signed)
Admitting doctor called for pain medicine no orders exist for pain  She will write them

## 2018-12-24 NOTE — Plan of Care (Signed)

## 2018-12-24 NOTE — ED Notes (Signed)
The pts iv keeps beeping occluded  She moves her rt arm where iv located that stops the flow

## 2018-12-24 NOTE — ED Triage Notes (Signed)
Pt from home with ems for fall. Pt got up off the toilet when she lost her balance and fell. Denies LOC. Pt c.o right hip pain. Left leg prosthesis. Pt on 3L [place] at home, noncompliant with oxygen per daughter to EMS. 20G LFA, 168ml fluids given en route. Pt a.o. Initial BP 96 palp, 94/42.

## 2018-12-25 ENCOUNTER — Inpatient Hospital Stay (HOSPITAL_COMMUNITY): Payer: Medicare HMO | Admitting: Certified Registered Nurse Anesthetist

## 2018-12-25 ENCOUNTER — Inpatient Hospital Stay (HOSPITAL_COMMUNITY): Payer: Medicare HMO

## 2018-12-25 ENCOUNTER — Encounter (HOSPITAL_COMMUNITY)
Admission: EM | Disposition: A | Discharge status: Skilled Nursing Facility | Payer: Self-pay | Source: Home / Self Care | Attending: Internal Medicine

## 2018-12-25 ENCOUNTER — Encounter (HOSPITAL_COMMUNITY): Payer: Self-pay | Admitting: Internal Medicine

## 2018-12-25 DIAGNOSIS — N179 Acute kidney failure, unspecified: Secondary | ICD-10-CM

## 2018-12-25 DIAGNOSIS — I959 Hypotension, unspecified: Secondary | ICD-10-CM

## 2018-12-25 DIAGNOSIS — R8281 Pyuria: Secondary | ICD-10-CM

## 2018-12-25 DIAGNOSIS — Z794 Long term (current) use of insulin: Secondary | ICD-10-CM

## 2018-12-25 DIAGNOSIS — E1140 Type 2 diabetes mellitus with diabetic neuropathy, unspecified: Secondary | ICD-10-CM

## 2018-12-25 DIAGNOSIS — E785 Hyperlipidemia, unspecified: Secondary | ICD-10-CM

## 2018-12-25 DIAGNOSIS — S72141A Displaced intertrochanteric fracture of right femur, initial encounter for closed fracture: Principal | ICD-10-CM

## 2018-12-25 DIAGNOSIS — N1831 Chronic kidney disease, stage 3a: Secondary | ICD-10-CM

## 2018-12-25 HISTORY — PX: INTRAMEDULLARY (IM) NAIL INTERTROCHANTERIC: SHX5875

## 2018-12-25 LAB — RENAL FUNCTION PANEL
Albumin: 2 g/dL — ABNORMAL LOW (ref 3.5–5.0)
Anion gap: 11 (ref 5–15)
BUN: 13 mg/dL (ref 6–20)
CO2: 26 mmol/L (ref 22–32)
Calcium: 8 mg/dL — ABNORMAL LOW (ref 8.9–10.3)
Chloride: 101 mmol/L (ref 98–111)
Creatinine, Ser: 1.44 mg/dL — ABNORMAL HIGH (ref 0.44–1.00)
GFR calc Af Amer: 48 mL/min — ABNORMAL LOW (ref >60)
GFR calc non Af Amer: 41 mL/min — ABNORMAL LOW (ref >60)
Glucose, Bld: 90 mg/dL (ref 70–99)
Phosphorus: 4.1 mg/dL (ref 2.5–4.6)
Potassium: 3.7 mmol/L (ref 3.5–5.1)
Sodium: 138 mmol/L (ref 135–145)

## 2018-12-25 LAB — URINALYSIS, ROUTINE W REFLEX MICROSCOPIC
Bilirubin Urine: NEGATIVE
Glucose, UA: NEGATIVE mg/dL
Hgb urine dipstick: NEGATIVE
Ketones, ur: NEGATIVE mg/dL
Nitrite: NEGATIVE
Protein, ur: 30 mg/dL — AB
Specific Gravity, Urine: 1.019 (ref 1.005–1.030)
WBC, UA: >50 WBC/hpf — ABNORMAL HIGH (ref 0–5)
pH: 5 (ref 5.0–8.0)

## 2018-12-25 LAB — GLUCOSE, CAPILLARY
Glucose-Capillary: 135 mg/dL — ABNORMAL HIGH (ref 70–99)
Glucose-Capillary: 92 mg/dL (ref 70–99)
Glucose-Capillary: 80 mg/dL (ref 70–99)
Glucose-Capillary: 82 mg/dL (ref 70–99)
Glucose-Capillary: 85 mg/dL (ref 70–99)

## 2018-12-25 LAB — POCT I-STAT EG7
Acid-Base Excess: 3 mmol/L — ABNORMAL HIGH (ref 0.0–2.0)
Bicarbonate: 29.2 mmol/L — ABNORMAL HIGH (ref 20.0–28.0)
Calcium, Ion: 1.13 mmol/L — ABNORMAL LOW (ref 1.15–1.40)
HCT: 33 % — ABNORMAL LOW (ref 36.0–46.0)
Hemoglobin: 11.2 g/dL — ABNORMAL LOW (ref 12.0–15.0)
O2 Saturation: 76 %
Potassium: 3.8 mmol/L (ref 3.5–5.1)
Sodium: 138 mmol/L (ref 135–145)
TCO2: 31 mmol/L (ref 22–32)
pCO2, Ven: 53 mmHg (ref 44.0–60.0)
pH, Ven: 7.349 (ref 7.250–7.430)
pO2, Ven: 44 mmHg (ref 32.0–45.0)

## 2018-12-25 LAB — CBC WITH DIFFERENTIAL/PLATELET
Abs Immature Granulocytes: 0 10*3/uL (ref 0.00–0.07)
Basophils Absolute: 0.2 10*3/uL — ABNORMAL HIGH (ref 0.0–0.1)
Basophils Relative: 2 %
Eosinophils Absolute: 0.2 10*3/uL (ref 0.0–0.5)
Eosinophils Relative: 2 %
HCT: 35.3 % — ABNORMAL LOW (ref 36.0–46.0)
Hemoglobin: 10.7 g/dL — ABNORMAL LOW (ref 12.0–15.0)
Lymphocytes Relative: 8 %
Lymphs Abs: 0.8 10*3/uL (ref 0.7–4.0)
MCH: 24.5 pg — ABNORMAL LOW (ref 26.0–34.0)
MCHC: 30.3 g/dL (ref 30.0–36.0)
MCV: 81 fL (ref 80.0–100.0)
Monocytes Absolute: 0.4 10*3/uL (ref 0.1–1.0)
Monocytes Relative: 4 %
Neutro Abs: 8.1 10*3/uL — ABNORMAL HIGH (ref 1.7–7.7)
Neutrophils Relative %: 84 %
Platelets: 238 10*3/uL (ref 150–400)
RBC: 4.36 MIL/uL (ref 3.87–5.11)
RDW: 22.1 % — ABNORMAL HIGH (ref 11.5–15.5)
WBC: 9.7 10*3/uL (ref 4.0–10.5)
nRBC: 0 % (ref 0.0–0.2)
nRBC: 0 /100 WBC

## 2018-12-25 LAB — RAPID URINE DRUG SCREEN, HOSP PERFORMED
Amphetamines: NOT DETECTED
Barbiturates: NOT DETECTED
Benzodiazepines: POSITIVE — AB
Cocaine: NOT DETECTED
Opiates: POSITIVE — AB
Tetrahydrocannabinol: NOT DETECTED

## 2018-12-25 LAB — SURGICAL PCR SCREEN
MRSA, PCR: NEGATIVE
Staphylococcus aureus: NEGATIVE

## 2018-12-25 LAB — MAGNESIUM: Magnesium: 1.2 mg/dL — ABNORMAL LOW (ref 1.7–2.4)

## 2018-12-25 SURGERY — FIXATION, FRACTURE, INTERTROCHANTERIC, WITH INTRAMEDULLARY ROD
Anesthesia: General | Site: Leg Upper | Laterality: Right

## 2018-12-25 MED ORDER — POLYETHYLENE GLYCOL 3350 17 G PO PACK: 17.0000 g | PACK | Freq: Every day | ORAL | Status: DC | PRN

## 2018-12-25 MED ORDER — SODIUM CHLORIDE 0.9 % IV SOLN
1.0000 g | INTRAVENOUS | Status: DC
  Administered 2018-12-26 – 2018-12-28 (×3): 1 g via INTRAVENOUS
  Filled 2018-12-25: qty 10
  Filled 2018-12-25: qty 1
  Filled 2018-12-25 (×2): qty 10

## 2018-12-25 MED ORDER — TRANEXAMIC ACID-NACL 1000-0.7 MG/100ML-% IV SOLN
1000.0000 mg | INTRAVENOUS | Status: AC
  Administered 2018-12-25: 1000 mg via INTRAVENOUS

## 2018-12-25 MED ORDER — ADULT MULTIVITAMIN W/MINERALS CH
1.0000 | ORAL_TABLET | Freq: Every day | ORAL | Status: DC
  Administered 2018-12-25 – 2019-01-07 (×14): 1 via ORAL
  Filled 2018-12-25 (×15): qty 1

## 2018-12-25 MED ORDER — ENOXAPARIN SODIUM 40 MG/0.4ML ~~LOC~~ SOLN: 40.0000 mg | Freq: Every day | SUBCUTANEOUS | 13 refills | Status: DC

## 2018-12-25 MED ORDER — CEFAZOLIN SODIUM-DEXTROSE 2-4 GM/100ML-% IV SOLN
2.0000 g | INTRAVENOUS | Status: AC
  Administered 2018-12-25: 2 g via INTRAVENOUS

## 2018-12-25 MED ORDER — ALUM & MAG HYDROXIDE-SIMETH 200-200-20 MG/5ML PO SUSP: 30.0000 mL | ORAL | Status: DC | PRN

## 2018-12-25 MED ORDER — TRANEXAMIC ACID 1000 MG/10ML IV SOLN
2000.0000 mg | INTRAVENOUS | Status: DC
  Filled 2018-12-25: qty 20

## 2018-12-25 MED ORDER — ACETAMINOPHEN 500 MG PO TABS
500.0000 mg | ORAL_TABLET | Freq: Four times a day (QID) | ORAL | Status: AC
  Administered 2018-12-25: 23:00:00 500 mg via ORAL
  Filled 2018-12-25 (×2): qty 1

## 2018-12-25 MED ORDER — PHENYLEPHRINE HCL-NACL 10-0.9 MG/250ML-% IV SOLN
INTRAVENOUS | Status: DC | PRN
  Administered 2018-12-25: 100 ug/min via INTRAVENOUS

## 2018-12-25 MED ORDER — ONDANSETRON HCL 4 MG/2ML IJ SOLN: 4.0000 mg | Freq: Four times a day (QID) | INTRAMUSCULAR | Status: DC | PRN

## 2018-12-25 MED ORDER — DOCUSATE SODIUM 100 MG PO CAPS
100.0000 mg | ORAL_CAPSULE | Freq: Two times a day (BID) | ORAL | Status: DC
  Administered 2018-12-25 – 2019-01-08 (×3): 100 mg via ORAL
  Filled 2018-12-25 (×18): qty 1

## 2018-12-25 MED ORDER — MORPHINE SULFATE (PF) 2 MG/ML IV SOLN: 1.0000 mg | INTRAVENOUS | Status: DC | PRN

## 2018-12-25 MED ORDER — LIDOCAINE 2% (20 MG/ML) 5 ML SYRINGE
INTRAMUSCULAR | Status: DC | PRN
  Administered 2018-12-25: 100 mg via INTRAVENOUS

## 2018-12-25 MED ORDER — METHOCARBAMOL 500 MG PO TABS
500.0000 mg | ORAL_TABLET | Freq: Four times a day (QID) | ORAL | Status: DC | PRN
  Administered 2018-12-30 – 2019-01-03 (×4): 500 mg via ORAL
  Filled 2018-12-25 (×4): qty 1

## 2018-12-25 MED ORDER — HYDROMORPHONE HCL 1 MG/ML IJ SOLN
0.2500 mg | INTRAMUSCULAR | Status: DC | PRN
  Administered 2018-12-25: 0.25 mg via INTRAVENOUS

## 2018-12-25 MED ORDER — FENTANYL CITRATE (PF) 250 MCG/5ML IJ SOLN
INTRAMUSCULAR | Status: DC | PRN
  Administered 2018-12-25 (×3): 50 ug via INTRAVENOUS
  Administered 2018-12-25: 100 ug via INTRAVENOUS

## 2018-12-25 MED ORDER — PROPOFOL 10 MG/ML IV BOLUS
INTRAVENOUS | Status: DC | PRN
  Administered 2018-12-25: 100 mg via INTRAVENOUS

## 2018-12-25 MED ORDER — METHOCARBAMOL 1000 MG/10ML IJ SOLN
500.0000 mg | Freq: Four times a day (QID) | INTRAVENOUS | Status: DC | PRN
  Filled 2018-12-25: qty 5

## 2018-12-25 MED ORDER — HYDROMORPHONE HCL 1 MG/ML IJ SOLN
INTRAMUSCULAR | Status: AC
  Filled 2018-12-25: qty 1

## 2018-12-25 MED ORDER — FENTANYL CITRATE (PF) 250 MCG/5ML IJ SOLN
INTRAMUSCULAR | Status: AC
  Filled 2018-12-25: qty 5

## 2018-12-25 MED ORDER — MEPERIDINE HCL 25 MG/ML IJ SOLN: 6.2500 mg | INTRAMUSCULAR | Status: DC | PRN

## 2018-12-25 MED ORDER — MIDAZOLAM HCL 2 MG/2ML IJ SOLN
INTRAMUSCULAR | Status: AC
  Filled 2018-12-25: qty 2

## 2018-12-25 MED ORDER — HYDROCODONE-ACETAMINOPHEN 5-325 MG PO TABS
1.0000 | ORAL_TABLET | ORAL | Status: DC | PRN
  Administered 2018-12-27 – 2018-12-30 (×7): 2 via ORAL
  Filled 2018-12-25 (×8): qty 2

## 2018-12-25 MED ORDER — SUGAMMADEX SODIUM 200 MG/2ML IV SOLN
INTRAVENOUS | Status: DC | PRN
  Administered 2018-12-25: 220 mg via INTRAVENOUS

## 2018-12-25 MED ORDER — ACETAMINOPHEN 325 MG PO TABS
325.0000 mg | ORAL_TABLET | Freq: Four times a day (QID) | ORAL | Status: DC | PRN
  Administered 2018-12-28: 22:00:00 500 mg via ORAL
  Administered 2018-12-30: 14:00:00 325 mg via ORAL
  Filled 2018-12-25: qty 1
  Filled 2018-12-25: qty 2

## 2018-12-25 MED ORDER — ONDANSETRON HCL 4 MG/2ML IJ SOLN
INTRAMUSCULAR | Status: AC
  Filled 2018-12-25: qty 2

## 2018-12-25 MED ORDER — ROCURONIUM BROMIDE 50 MG/5ML IV SOSY
PREFILLED_SYRINGE | INTRAVENOUS | Status: DC | PRN
  Administered 2018-12-25: 50 mg via INTRAVENOUS

## 2018-12-25 MED ORDER — ENSURE ENLIVE PO LIQD
237.0000 mL | Freq: Every day | ORAL | Status: DC
  Administered 2018-12-25 – 2018-12-31 (×7): 237 mL via ORAL

## 2018-12-25 MED ORDER — ONDANSETRON HCL 4 MG PO TABS: 4.0000 mg | ORAL_TABLET | Freq: Four times a day (QID) | ORAL | Status: DC | PRN

## 2018-12-25 MED ORDER — CEFAZOLIN SODIUM-DEXTROSE 2-4 GM/100ML-% IV SOLN
INTRAVENOUS | Status: AC
  Filled 2018-12-25: qty 100

## 2018-12-25 MED ORDER — PROPOFOL 10 MG/ML IV BOLUS
INTRAVENOUS | Status: AC
  Filled 2018-12-25: qty 20

## 2018-12-25 MED ORDER — PHENOL 1.4 % MT LIQD: 1.0000 | OROMUCOSAL | Status: DC | PRN

## 2018-12-25 MED ORDER — SUCCINYLCHOLINE CHLORIDE 20 MG/ML IJ SOLN
INTRAMUSCULAR | Status: DC | PRN
  Administered 2018-12-25: 140 mg via INTRAVENOUS

## 2018-12-25 MED ORDER — ENOXAPARIN SODIUM 40 MG/0.4ML ~~LOC~~ SOLN
40.0000 mg | SUBCUTANEOUS | Status: DC
  Administered 2018-12-26 – 2019-01-08 (×14): 40 mg via SUBCUTANEOUS
  Filled 2018-12-25 (×15): qty 0.4

## 2018-12-25 MED ORDER — ONDANSETRON HCL 4 MG/2ML IJ SOLN: 4.0000 mg | Freq: Once | INTRAMUSCULAR | Status: DC | PRN

## 2018-12-25 MED ORDER — ONDANSETRON HCL 4 MG/2ML IJ SOLN
INTRAMUSCULAR | Status: DC | PRN
  Administered 2018-12-25: 4 mg via INTRAVENOUS

## 2018-12-25 MED ORDER — LACTATED RINGERS IV SOLN: INTRAVENOUS | Status: DC

## 2018-12-25 MED ORDER — SODIUM CHLORIDE 0.9 % IV SOLN: INTRAVENOUS | Status: DC

## 2018-12-25 MED ORDER — HYDROCODONE-ACETAMINOPHEN 7.5-325 MG PO TABS
1.0000 | ORAL_TABLET | ORAL | Status: DC | PRN
  Administered 2018-12-25 – 2018-12-27 (×4): 1 via ORAL
  Administered 2018-12-30 – 2019-01-02 (×5): 2 via ORAL
  Administered 2019-01-02 (×2): 1 via ORAL
  Administered 2019-01-05 – 2019-01-07 (×6): 2 via ORAL
  Administered 2019-01-08: 1 via ORAL
  Administered 2019-01-08 – 2019-01-09 (×4): 2 via ORAL
  Filled 2018-12-25: qty 2
  Filled 2018-12-25 (×2): qty 1
  Filled 2018-12-25: qty 2
  Filled 2018-12-25: qty 1
  Filled 2018-12-25 (×4): qty 2
  Filled 2018-12-25: qty 1
  Filled 2018-12-25 (×8): qty 2
  Filled 2018-12-25: qty 1
  Filled 2018-12-25: qty 2
  Filled 2018-12-25: qty 1
  Filled 2018-12-25: qty 2

## 2018-12-25 MED ORDER — TRANEXAMIC ACID-NACL 1000-0.7 MG/100ML-% IV SOLN
INTRAVENOUS | Status: AC
  Filled 2018-12-25: qty 100

## 2018-12-25 MED ORDER — 0.9 % SODIUM CHLORIDE (POUR BTL) OPTIME
TOPICAL | Status: DC | PRN
  Administered 2018-12-25: 1000 mL

## 2018-12-25 MED ORDER — MAGNESIUM CITRATE PO SOLN: 1.0000 | Freq: Once | ORAL | Status: DC | PRN

## 2018-12-25 MED ORDER — CEFAZOLIN SODIUM-DEXTROSE 2-4 GM/100ML-% IV SOLN
2.0000 g | Freq: Four times a day (QID) | INTRAVENOUS | Status: AC
  Administered 2018-12-25 (×2): 2 g via INTRAVENOUS
  Filled 2018-12-25 (×2): qty 100

## 2018-12-25 MED ORDER — PHENYLEPHRINE 40 MCG/ML (10ML) SYRINGE FOR IV PUSH (FOR BLOOD PRESSURE SUPPORT)
PREFILLED_SYRINGE | INTRAVENOUS | Status: DC | PRN
  Administered 2018-12-25: 120 ug via INTRAVENOUS
  Administered 2018-12-25: 80 ug via INTRAVENOUS
  Administered 2018-12-25: 120 ug via INTRAVENOUS

## 2018-12-25 MED ORDER — MENTHOL 3 MG MT LOZG: 1.0000 | LOZENGE | OROMUCOSAL | Status: DC | PRN

## 2018-12-25 MED ORDER — MAGNESIUM SULFATE 2 GM/50ML IV SOLN
2.0000 g | Freq: Once | INTRAVENOUS | Status: AC
  Administered 2018-12-25: 19:00:00 2 g via INTRAVENOUS
  Filled 2018-12-25: qty 50

## 2018-12-25 MED ORDER — SORBITOL 70 % SOLN: 30.0000 mL | Freq: Every day | Status: DC | PRN

## 2018-12-25 MED ORDER — SUGAMMADEX SODIUM 500 MG/5ML IV SOLN
INTRAVENOUS | Status: AC
  Filled 2018-12-25: qty 10

## 2018-12-25 MED ORDER — OXYCODONE-ACETAMINOPHEN 5-325 MG PO TABS: 1.0000 | ORAL_TABLET | Freq: Three times a day (TID) | ORAL | 0 refills | Status: DC | PRN

## 2018-12-25 SURGICAL SUPPLY — 48 items
BIT DRILL SHORT 4.0 (BIT) IMPLANT
BNDG COHESIVE 4X5 TAN STRL (GAUZE/BANDAGES/DRESSINGS) ×3 IMPLANT
BNDG COHESIVE 6X5 TAN STRL LF (GAUZE/BANDAGES/DRESSINGS) ×4 IMPLANT
BNDG GAUZE ELAST 4 BULKY (GAUZE/BANDAGES/DRESSINGS) ×1 IMPLANT
COVER MAYO STAND STRL (DRAPES) ×2 IMPLANT
COVER PERINEAL POST (MISCELLANEOUS) ×3 IMPLANT
COVER SURGICAL LIGHT HANDLE (MISCELLANEOUS) ×3 IMPLANT
COVER WAND RF STERILE (DRAPES) ×3 IMPLANT
DRAPE C-ARMOR (DRAPES) ×3 IMPLANT
DRAPE STERI IOBAN 125X83 (DRAPES) ×3 IMPLANT
DRILL BIT SHORT 4.0 (BIT) ×2
DRSG MEPILEX BORDER 4X4 (GAUZE/BANDAGES/DRESSINGS) ×3 IMPLANT
DRSG MEPILEX BORDER 4X8 (GAUZE/BANDAGES/DRESSINGS) ×5 IMPLANT
DRSG PAD ABDOMINAL 8X10 ST (GAUZE/BANDAGES/DRESSINGS) ×2 IMPLANT
DURAPREP 26ML APPLICATOR (WOUND CARE) ×3 IMPLANT
ELECT REM PT RETURN 9FT ADLT (ELECTROSURGICAL) ×3
ELECTRODE REM PT RTRN 9FT ADLT (ELECTROSURGICAL) ×1 IMPLANT
GAUZE XEROFORM 1X8 LF (GAUZE/BANDAGES/DRESSINGS) ×2 IMPLANT
GLOVE BIOGEL PI IND STRL 7.0 (GLOVE) ×1 IMPLANT
GLOVE BIOGEL PI INDICATOR 7.0 (GLOVE) ×2
GLOVE ECLIPSE 7.0 STRL STRAW (GLOVE) ×3 IMPLANT
GLOVE SKINSENSE NS SZ7.5 (GLOVE) ×4
GLOVE SKINSENSE STRL SZ7.5 (GLOVE) ×2 IMPLANT
GOWN STRL REIN XL XLG (GOWN DISPOSABLE) ×3 IMPLANT
GUIDE PIN 3.2X343 (PIN) ×2
GUIDE PIN 3.2X343MM (PIN) ×4
GUIDE ROD 3.0 (MISCELLANEOUS) ×3
KIT BASIN OR (CUSTOM PROCEDURE TRAY) ×3 IMPLANT
KIT TURNOVER KIT B (KITS) ×3 IMPLANT
MANIFOLD NEPTUNE II (INSTRUMENTS) ×3 IMPLANT
NAIL FEM 1.5 125D RT 11.5X36 (Nail) ×2 IMPLANT
NS IRRIG 1000ML POUR BTL (IV SOLUTION) ×3 IMPLANT
PACK GENERAL/GYN (CUSTOM PROCEDURE TRAY) ×3 IMPLANT
PAD ARMBOARD 7.5X6 YLW CONV (MISCELLANEOUS) ×6 IMPLANT
PAD CAST 4YDX4 CTTN HI CHSV (CAST SUPPLIES) ×2 IMPLANT
PADDING CAST COTTON 4X4 STRL (CAST SUPPLIES) ×4
PIN GUIDE 3.2X343MM (PIN) IMPLANT
ROD GUIDE 3.0 (MISCELLANEOUS) IMPLANT
SCREW LAG COMPR KIT 100/95 (Screw) ×2 IMPLANT
SCREW TRIGEN LOW PROF 5.0X47.5 (Screw) ×2 IMPLANT
STAPLER VISISTAT 35W (STAPLE) ×3 IMPLANT
SUT VIC AB 0 CT1 27 (SUTURE) ×2
SUT VIC AB 0 CT1 27XBRD ANBCTR (SUTURE) ×1 IMPLANT
SUT VIC AB 2-0 CT1 27 (SUTURE) ×4
SUT VIC AB 2-0 CT1 TAPERPNT 27 (SUTURE) ×1 IMPLANT
TOWEL GREEN STERILE (TOWEL DISPOSABLE) ×3 IMPLANT
TOWEL GREEN STERILE FF (TOWEL DISPOSABLE) ×3 IMPLANT
WATER STERILE IRR 1000ML POUR (IV SOLUTION) ×3 IMPLANT

## 2018-12-25 NOTE — Progress Notes (Signed)
[name] Kitchen  PROGRESS NOTE    [name]  [id number] DOB: [birth year] DOA: 12/24/2018 PCP: [name], PA-C   Brief Narrative:   [name] is a 53 y.o. female with medical history significant of asthma, COPD, stage IV CKD, morbid obesity, depression, anxiety, hypertension, insulin-dependent diabetes mellitus, left-sided DKA, diabetic neuropathy, GERD, HIV, hyperlipidemia was at home today and was trying to get up from the commode reports feeling dizzy and fell on her right side.  She reports pain on the right hip.  On arrival to ED she underwent x-rays of the right hip and was found to have a Possible left pelvic bone fractures. Probable intertrochanteric fracture of the proximal right femur. As per the patient, she denied hitting her head or syncope. She reports she was trying to get up from the commode after BM, felt dizzy and fell on the right side. She denies any chest pain or sob, fever, chills, nausea, vomiting , abdominal pain, headache, tingling or numbness, diarrhea, dysuria, hematochezia, or hematemesis. She denies any blurry vision at this time.  Orthopedics consulted and she was referred to medical service for admission.   12/25/18: Denies complaints this AM.    Assessment & Plan:   Principal Problem:   Displaced intertrochanteric fracture of right femur, initial encounter for closed fracture [facility])  Intertrochanteric fracture of the proximal right femur:     - ortho onboard     - to surgery today     - Pain control.   Dizziness and fall     - Possibly secondary to hypotension.     - echo: Left Ventricle: Left ventricular ejection fraction, by visual estimation, is 60 to 65%. The left ventricle has normal function. The left ventricle has no regional wall motion abnormalities. The left ventricular internal cavity size was the left  ventricle is normal in size. There is mildly increased left ventricular hypertrophy. Left ventricular diastolic parameters were  normal.Right Ventricle: The right ventricular size is mildly enlarged. No increase in right ventricular wall thickness. Global RV systolic function is has normal systolic function.     - negative trp  Hypotension      - Resolved with IV fluids.      - Probably dehydration.      - Hold home BP meds.   IDDM complicated by diabetic neuropathy     - SSI; monitor     - A1c 7.1  Hyperlipidemia      - crestor  CKD IIIa     - Baseline creatinine ranging between 1 to 1.4. currently its at 1.4  Pyuria     - UA dirty     - UCx ordered 12/23 but not obtained; reordered     - start rocephin after collection of urine  HIV      - biktarvy  COPD and Asthma:      - flonase, breo ellipta     - PRN albuterol, duonebs   Tobacco abuse:      - Cessation counseling provided.      - Start her on nicotine patch.      - UDS pending   Left rib fracture:      - Pain control and incentive spirometry.   Hypomagnesemia     - replete, monitor  DVT prophylaxis: lovenox Code Status: FULL   Disposition Plan: TBD  Consultants:   ortho  Subjective: Denies complaints this AM  Objective: Vitals:   12/25/18 0505 12/25/18 0745 12/25/18 1012 12/25/18 1219  BP: (!) 115/59  113/62   Pulse: 96 82 85   Resp:  16 18   Temp: 99.2 F (37.3 C)     TempSrc: Oral     SpO2: 94% 94% 98%   Weight:    106.6 kg  Height:    5\' 10"  (1.778 m)    Intake/Output Summary (Last 24 hours) at 12/25/2018 1338 Last data filed at 12/25/2018 0345 Gross per 24 hour  Intake 3633.53 ml  Output --  Net 3633.53 ml   Filed Weights   12/24/18 1237 12/25/18 1219  Weight: 106.6 kg 106.6 kg    Examination:  General: 53 y.o. female resting in bed in NAD Cardiovascular: RRR, +S1, S2, no m/g/r Respiratory: CTABL, no w/r/r, normal WOB GI: BS+, NDNT, no masses noted, no organomegaly noted MSK: No e/c/c Neuro: Alert to name, follows commands Psyc: Appropriate interaction and affect,  calm/cooperative   Data Reviewed: I have personally reviewed following labs and imaging studies.  CBC: Recent Labs  Lab 12/24/18 1300 12/25/18 1106  WBC 11.6* 9.7  NEUTROABS 10.4* 8.1*  HGB 12.8 10.7*  HCT 43.2 35.3*  MCV 81.1 81.0  PLT 296 [id number]   Basic Metabolic Panel: Recent Labs  Lab 12/24/18 1300 12/25/18 1106  NA 140 138  K 4.2 3.7  CL 99 101  CO2 27 26  GLUCOSE 196* 90  BUN 9 13  CREATININE 1.33* 1.44*  CALCIUM 8.8* 8.0*  MG  --  1.2*  PHOS  --  4.1   GFR: Estimated Creatinine Clearance: 59 mL/min (A) (by C-G formula based on SCr of 1.44 mg/dL (H)). Liver Function Tests: Recent Labs  Lab 12/24/18 1300 12/25/18 1106  AST 21  --   ALT 15  --   ALKPHOS 187*  --   BILITOT 1.0  --   PROT 7.0  --   ALBUMIN 2.4* 2.0*   No results for input(s): LIPASE, AMYLASE in the last 168 hours. No results for input(s): AMMONIA in the last 168 hours. Coagulation Profile: No results for input(s): INR, PROTIME in the last 168 hours. Cardiac Enzymes: No results for input(s): CKTOTAL, CKMB, CKMBINDEX, TROPONINI in the last 168 hours. BNP (last 3 results) No results for input(s): PROBNP in the last 8760 hours. HbA1C: Recent Labs    12/24/18 1830  HGBA1C 7.1*   CBG: Recent Labs  Lab 12/24/18 1744 12/24/18 2155 12/25/18 0647 12/25/18 1140  GLUCAP 164* 136* 135* 92   Lipid Profile: No results for input(s): CHOL, HDL, LDLCALC, TRIG, CHOLHDL, LDLDIRECT in the last 72 hours. Thyroid Function Tests: No results for input(s): TSH, T4TOTAL, FREET4, T3FREE, THYROIDAB in the last 72 hours. Anemia Panel: No results for input(s): VITAMINB12, FOLATE, FERRITIN, TIBC, IRON, RETICCTPCT in the last 72 hours. Sepsis Labs: Recent Labs  Lab 12/24/18 1300 12/24/18 1540  LATICACIDVEN 1.7 0.9    Recent Results (from the past 240 hour(s))  Blood culture (routine x 2)     Status: None (Preliminary result)   Collection Time: 12/24/18 12:39 PM   Specimen: BLOOD  Result Value Ref  Range Status   Specimen Description BLOOD SITE NOT SPECIFIED  Final   Special Requests   Final    BOTTLES DRAWN AEROBIC AND ANAEROBIC Blood Culture adequate volume   Culture   Final    NO GROWTH < 24 HOURS Performed at [facility], [place] [address], [place],  [address]    Report Status PENDING  Incomplete  Blood culture (routine x 2)  Status: None (Preliminary result)   Collection Time: 12/24/18  1:37 PM   Specimen: BLOOD  Result Value Ref Range Status   Specimen Description BLOOD SITE NOT SPECIFIED  Final   Special Requests   Final    BOTTLES DRAWN AEROBIC AND ANAEROBIC Blood Culture adequate volume   Culture   Final    NO GROWTH < 24 HOURS Performed at [facility], [address] [place], [place] [address]    Report Status PENDING  Incomplete  SARS CORONAVIRUS 2 (TAT 6-24 HRS) Nasopharyngeal Nasopharyngeal Swab     Status: None   Collection Time: 12/24/18  2:21 PM   Specimen: Nasopharyngeal Swab  Result Value Ref Range Status   SARS Coronavirus 2 NEGATIVE NEGATIVE Final    Comment: (NOTE) SARS-CoV-2 target nucleic acids are NOT DETECTED. The SARS-CoV-2 RNA is generally detectable in upper and lower respiratory specimens during the acute phase of infection. Negative results do not preclude SARS-CoV-2 infection, do not rule out co-infections with other pathogens, and should not be used as the sole basis for treatment or other patient management decisions. Negative results must be combined with clinical observations, patient history, and epidemiological information. The expected result is Negative. Fact Sheet for Patients: SugarRoll.be Fact Sheet for Healthcare Providers: https://www.woods-mathews.com/ This test is not yet approved or cleared by the [place] FDA and  has been authorized for detection and/or diagnosis of SARS-CoV-2 by FDA under an Emergency Use Authorization (EUA). This EUA will remain  in  effect (meaning this test can be used) for the duration of the COVID-19 declaration under Section 56 4(b)(1) of the Act, 21 U.S.C. section 360bbb-3(b)(1), unless the authorization is terminated or revoked sooner. Performed at [facility], [place] [address] [place], [place] [address]   Surgical pcr screen     Status: None   Collection Time: 12/24/18 10:13 PM   Specimen: Nasal Mucosa; Nasal Swab  Result Value Ref Range Status   MRSA, PCR NEGATIVE NEGATIVE Final   Staphylococcus aureus NEGATIVE NEGATIVE Final    Comment: (NOTE) The Xpert SA Assay (FDA approved for NASAL specimens in patients 25 years of age and older), is one component of a comprehensive surveillance program. It is not intended to diagnose infection nor to guide or monitor treatment. Performed at [facility], [place] [address] [place], [place] [address]       Radiology Studies: CT PELVIS WO CONTRAST  Result Date: 12/25/2018 CLINICAL DATA:  Pelvic fracture EXAM: CT PELVIS WITHOUT CONTRAST TECHNIQUE: Multidetector CT imaging of the pelvis was performed following the standard protocol without intravenous contrast. COMPARISON:  None. FINDINGS: Urinary Tract: The visualized distal ureters and bladder appear unremarkable. Bowel: No bowel wall thickening, distention or surrounding inflammation identified within the pelvis. Scattered colonic diverticula are noted. Vascular/Lymphatic: No enlarged pelvic lymph nodes identified. Scattered atherosclerosis is noted. Reproductive: The uterus and adnexa are unremarkable. Other: There is diffuse dependent subcutaneous edema. Musculoskeletal: There is a comminuted displaced right intratrochanteric and proximal femoral shaft. There is approximately a a 2 cm of posterior displacement of the femoral shaft. The femoral head appears to be anteriorly rotated, however still articulates with the acetabulum. There is diffuse osteopenia. The patient has had prior cerclage fixation and healed  fracture deformity of the right proximal femur. There is fragmentation with sclerosis of the superior left femoral head, likely chronic avascular necrosis. No femoral head collapse however is noted. The sacroiliac joints appear to be intact. IMPRESSION: 1. Comminuted displaced right intratrochanteric  fracture involving the proximal femoral shaft. Approximately 2 cm posterior displacement of the femoral shaft. 2. Findings suggestive of chronic avascular necrosis of the left superior femoral head. 3. Diffuse osteopenia. Electronically Signed   By: Prudencio Pair M.D.   On: 12/25/2018 00:28   DG Chest Port 1 View  Result Date: 12/24/2018 CLINICAL DATA:  Fall EXAM: PORTABLE CHEST 1 VIEW COMPARISON:  Chest x-ray dated 06/15/2018. FINDINGS: Stable cardiomegaly. Lungs are clear. No pleural effusion or pneumothorax is seen. Subtle deformity within the posterior LEFT fifth rib, not seen on previous exams, suspicious for acute rib fracture. Additional deformity of the posterior LEFT seventh rib is likely old and seen on earlier chest CT of 01/02/2018. IMPRESSION: 1. No active disease. No evidence of pneumonia or pulmonary edema. 2. Slightly displaced fracture of the posterior LEFT fifth rib, most likely acute. Electronically Signed   By: Franki Cabot M.D.   On: 12/24/2018 14:08   ECHOCARDIOGRAM COMPLETE  Result Date: 12/24/2018   ECHOCARDIOGRAM REPORT   Patient Name:   [name] Date of Exam: 12/24/2018 Medical Rec #:  [id number]         Height:       70.0 in Accession #:    [id number]        Weight:       235.0 lb Date of Birth:  [birth year]         BSA:          2.24 m Patient Age:    75 years          BP:           117/75 mmHg Patient Gender: F                 HR:           83 bpm. Exam Location:  Inpatient Procedure: 2D Echo, Color Doppler and Cardiac Doppler STAT ECHO Indications:    R94.31 Abnormal EKG, Dizziness  History:        Patient has prior history of Echocardiogram examinations, most                  recent 08/30/2017. CAD, COPD; Risk Factors:Hypertension, Diabetes                 and Dyslipidemia.  Sonographer:    Raquel Sarna Senior RDCS Referring Phys: [phone] Marylynn Pearson XU  Sonographer Comments: Technically difficult study due to patient body habitus. IMPRESSIONS  1. Left ventricular ejection fraction, by visual estimation, is 60 to 65%. The left ventricle has normal function. There is mildly increased left ventricular hypertrophy.  2. Global right ventricle has normal systolic function.The right ventricular size is mildly enlarged.  3. Left atrial size was normal.  4. Right atrial size was normal.  5. The mitral valve is normal in structure. No evidence of mitral valve regurgitation.  6. The tricuspid valve is normal in structure.  7. The aortic valve is tricuspid. Aortic valve regurgitation is not visualized. No evidence of aortic valve stenosis.  8. The pulmonic valve was not well visualized. Pulmonic valve regurgitation is not visualized.  9. The inferior vena cava is normal in size with greater than 50% respiratory variability, suggesting right atrial pressure of 3 mmHg. FINDINGS  Left Ventricle: Left ventricular ejection fraction, by visual estimation, is 60 to 65%. The left ventricle has normal function. The left ventricle has no regional wall motion abnormalities. The left ventricular internal cavity size was the left ventricle is normal  in size. There is mildly increased left ventricular hypertrophy. Left ventricular diastolic parameters were normal. Right Ventricle: The right ventricular size is mildly enlarged. No increase in right ventricular wall thickness. Global RV systolic function is has normal systolic function. Left Atrium: Left atrial size was normal in size. Right Atrium: Right atrial size was normal in size Pericardium: Trivial pericardial effusion is present. Mitral Valve: The mitral valve is normal in structure. No evidence of mitral valve regurgitation. Tricuspid Valve: The tricuspid valve  is normal in structure. Tricuspid valve regurgitation is not demonstrated. Aortic Valve: The aortic valve is tricuspid. Aortic valve regurgitation is not visualized. The aortic valve is structurally normal, with no evidence of sclerosis or stenosis. Pulmonic Valve: The pulmonic valve was not well visualized. Pulmonic valve regurgitation is not visualized. Pulmonic regurgitation is not visualized. Aorta: The aortic root is normal in size and structure. Venous: The inferior vena cava is normal in size with greater than 50% respiratory variability, suggesting right atrial pressure of 3 mmHg. IAS/Shunts: The interatrial septum was not well visualized.  LEFT VENTRICLE PLAX 2D LVIDd:         4.60 cm  Diastology LVIDs:         3.30 cm  LV e' lateral:   6.53 cm/s LV PW:         0.90 cm  LV E/e' lateral: 9.4 LV IVS:        0.70 cm  LV e' medial:    5.44 cm/s LVOT diam:     2.00 cm  LV E/e' medial:  11.3 LV SV:         53 ml LV SV Index:   22.83 LVOT Area:     3.14 cm  RIGHT VENTRICLE RV S prime:     10.10 cm/s TAPSE (M-mode): 1.8 cm LEFT ATRIUM           Index      RIGHT ATRIUM           Index LA diam:      3.00 cm 1.34 cm/m RA Area:     16.00 cm LA Vol (A4C): 21.1 ml 9.44 ml/m RA Volume:   39.20 ml  17.54 ml/m  AORTIC VALVE LVOT Vmax:   71.70 cm/s LVOT Vmean:  54.000 cm/s LVOT VTI:    0.140 m  AORTA Ao Root diam: 2.90 cm MITRAL VALVE MV Area (PHT): 3.17 cm             SHUNTS MV PHT:        69.31 msec           Systemic VTI:  0.14 m MV Decel Time: 239 msec             Systemic Diam: 2.00 cm MV E velocity: 61.60 cm/s 103 cm/s MV A velocity: 83.40 cm/s 70.3 cm/s MV E/A ratio:  0.74       1.5  Oswaldo Milian MD Electronically signed by Oswaldo Milian MD Signature Date/Time: 12/24/2018/9:56:09 PM    Final    DG HIP PORT UNILAT WITH PELVIS 1V RIGHT  Result Date: 12/24/2018 CLINICAL DATA:  Right hip pain secondary to a fall. EXAM: DG HIP (WITH OR WITHOUT PELVIS) 1V PORT RIGHT COMPARISON:  Radiographs dated  05/09/2014 FINDINGS: There is an abnormal configuration of the proximal right femur suggesting an intertrochanteric fracture of the proximal right femur but this is not definitive does of the hip flexion. There is also a deformity of the left ischium and ilium and medial  wall of the left acetabulum which appears different than the prior radiograph of 04/03/2018. Old healed fracture of the proximal left femur. IMPRESSION: Possible left pelvic bone fractures. Probable intertrochanteric fracture of the proximal right femur as described above. CT scan of the pelvis and left hip recommended for further evaluation. Electronically Signed   By: Lorriane Shire M.D.   On: 12/24/2018 14:28     Scheduled Meds: . [MAR Hold] ALPRAZolam  0.5 mg Oral TID  . [MAR Hold] bictegravir-emtricitabine-tenofovir AF  1 tablet Oral Daily  . [MAR Hold] feeding supplement (ENSURE ENLIVE)  237 mL Oral QHS  . [MAR Hold] fluticasone  1 spray Each Nare Daily  . [MAR Hold] fluticasone furoate-vilanterol  1 puff Inhalation Daily   And  . [MAR Hold] umeclidinium bromide  1 puff Inhalation Daily  . [MAR Hold] insulin aspart  0-5 Units Subcutaneous QHS  . [MAR Hold] insulin aspart  0-9 Units Subcutaneous TID WC  . [MAR Hold] insulin glargine  40 Units Subcutaneous BID  . [MAR Hold] multivitamin with minerals  1 tablet Oral Daily  . [MAR Hold] pantoprazole  40 mg Oral Daily  . povidone-iodine  2 application Topical Once  . [MAR Hold] pregabalin  225 mg Oral BID  . [MAR Hold] rosuvastatin  20 mg Oral Daily  . [MAR Hold] sertraline  150 mg Oral Daily  . tranexamic acid (CYKLOKAPRON) topical -INTRAOP  2,000 mg Topical To OR   Continuous Infusions: . sodium chloride 100 mL/hr at 12/25/18 0641  . lactated ringers 10 mL/hr at 12/25/18 1227     LOS: 1 day    Time spent: 25 minutes spent in the coordination of care today.    Jonnie Finner, DO [facility]  If 7PM-7AM, please contact  night-coverage [email or web address] 12/25/2018, 1:38 PM

## 2018-12-25 NOTE — Op Note (Signed)
Date of Surgery: 12/25/2018  INDICATIONS: [name] is a 53 y.o.-year-old female who sustained a right hip fracture. The risks and benefits of the procedure discussed with the patient prior to the procedure and all questions were answered; consent was obtained.  PREOPERATIVE DIAGNOSIS: right intertrochanteric hip fracture   POSTOPERATIVE DIAGNOSIS: Same   PROCEDURE: Open treatment of intertrochanteric fracture with intramedullary implant. CPT [id number]   SURGEON: N. Eduard Roux, M.D.   ASSIST: Ciro Backer [place], [place]; necessary for the timely completion of procedure and due to complexity of procedure.  ANESTHESIA: general   IV FLUIDS AND URINE: See anesthesia record   ESTIMATED BLOOD LOSS: 600 cc  IMPLANTS: [employer] InterTAN 11.5 x 36, 100/95 lag screws  DRAINS: None.   COMPLICATIONS: see description of procedure.   DESCRIPTION OF PROCEDURE: The patient was brought to the operating room and placed supine on the operating table. The patient's leg had been signed prior to the procedure. The patient had the anesthesia placed by the anesthesiologist. The prep verification and incision time-outs were performed to confirm that this was the correct patient, site, side and location. The patient had an SCD on the opposite lower extremity. The patient did receive antibiotics prior to the incision and was re-dosed during the procedure as needed at indicated intervals. The patient was positioned on the fracture table with the table in traction.  The fracture could not be well reduced in a closed fashion.  The well leg was placed in a scissor position and all bony prominences were well-padded. The patient had the lower extremity prepped and draped in the standard surgical fashion.  An incision was made on the upper lateral thigh and dissection was carried down onto the IT band which was then sharply incised.  I then used a bone hook to reduce the femoral head and neck out of flexion and a  Cobb to push the femoral shaft medially in order to reduce the fracture.  I then made a separate incision 4 finger breadths superior to the greater trochanter. A guide pin was inserted into the tip of the greater trochanter under fluoroscopic guidance. An opening reamer was used to gain access to the femoral canal. The nail length was measured and inserted down the femoral canal to its proper depth. The appropriate version of insertion for the lag screw was found under fluoroscopy. A pin was inserted up the femoral neck through the jig. Then, a second antirotation pin was inserted inferior to the first pin. The length of the lag screw was then measured. The lag screw was inserted as near to center-center in the head as possible. The antirotation pin was then taken out and an interdigitating compression screw was placed in its place. The leg was taken out of traction, then the interdigitating compression screw was used to compress across the fracture. Compression was visualized on serial xrays.  A distal interlocking screw was placed using the perfect circle technique.  The wound was copiously irrigated with saline and the subcutaneous layer closed with 2.0 vicryl and the skin was reapproximated with staples. The wounds were cleaned and dried a final time and a sterile dressing was placed. The hip was taken through a range of motion at the end of the case under fluoroscopic imaging to visualize the approach-withdraw phenomenon and confirm implant length in the head. The patient was then awakened from anesthesia and taken to the recovery room in stable condition. All counts were correct at the end of the  case.   POSTOPERATIVE PLAN: The patient will be weight bearing as tolerated and will return in 2 weeks for staple removal and the patient will receive DVT prophylaxis based on other medications, activity level, and risk ratio of bleeding to thrombosis.   Erin Cecil, MD [facility] 2:32 PM

## 2018-12-25 NOTE — Plan of Care (Signed)

## 2018-12-25 NOTE — Anesthesia Preprocedure Evaluation (Addendum)
Anesthesia Evaluation  Patient identified by MRN, date of birth, ID band Patient awake    Reviewed: Allergy & Precautions, NPO status , Patient's Chart, lab work & pertinent test results  Airway Mallampati: I  TM Distance: >3 FB Neck ROM: Full    Dental   Pulmonary COPD, former smoker,    Pulmonary exam normal        Cardiovascular hypertension, Pt. on medications + Past MI  Normal cardiovascular exam     Neuro/Psych Anxiety Depression    GI/Hepatic GERD  Medicated and Controlled,  Endo/Other  diabetes, Type 2, Oral Hypoglycemic Agents  Renal/GU Renal InsufficiencyRenal disease     Musculoskeletal   Abdominal   Peds  Hematology   Anesthesia Other Findings   Reproductive/Obstetrics                            Anesthesia Physical Anesthesia Plan  ASA: III  Anesthesia Plan: General   Post-op Pain Management:    Induction: Intravenous  PONV Risk Score and Plan: 3 and Midazolam, Ondansetron and Treatment may vary due to age or medical condition  Airway Management Planned: Oral ETT  Additional Equipment:   Intra-op Plan:   Post-operative Plan: Extubation in OR  Informed Consent: I have reviewed the patients History and Physical, chart, labs and discussed the procedure including the risks, benefits and alternatives for the proposed anesthesia with the patient or authorized representative who has indicated his/her understanding and acceptance.       Plan Discussed with: CRNA and Surgeon  Anesthesia Plan Comments:         Anesthesia Quick Evaluation

## 2018-12-25 NOTE — Progress Notes (Signed)
Pt due for a UA. Unable to collect, pt has not voided during my shift. Bladder scan showed 45mL. Will continue to monitor.

## 2018-12-25 NOTE — Anesthesia Procedure Notes (Signed)
Procedure Name: Intubation Date/Time: 12/25/2018 1:14 PM Performed by: Griffin Dakin, CRNA Pre-anesthesia Checklist: Patient identified, Emergency Drugs available, Suction available and Patient being monitored Patient Re-evaluated:Patient Re-evaluated prior to induction Oxygen Delivery Method: Circle system utilized Preoxygenation: Pre-oxygenation with 100% oxygen Induction Type: IV induction Ventilation: Mask ventilation without difficulty Laryngoscope Size: Mac and 4 Grade View: Grade I Tube type: Oral Tube size: 7.0 mm Number of attempts: 1 Airway Equipment and Method: Stylet and Oral airway Placement Confirmation: ETT inserted through vocal cords under direct vision,  positive ETCO2 and breath sounds checked- equal and bilateral Secured at: 22 cm Tube secured with: Tape Dental Injury: Teeth and Oropharynx as per pre-operative assessment

## 2018-12-25 NOTE — Plan of Care (Addendum)
This pt has an order for UA collection but has not urinated since sometime last night. She had surgery today and got back around 1645. Pt bladder scanned at 1845 and showed 117mL. She has been on LR 69mL/hr fluids. Marylyn Ishihara, DO notified, will continue to monitor.   Problem: Education: Goal: Knowledge of General Education information will improve Description: Including pain rating scale, medication(s)/side effects and non-pharmacologic comfort measures Outcome: Progressing   Problem: Health Behavior/Discharge Planning: Goal: Ability to manage health-related needs will improve Outcome: Progressing   Problem: Clinical Measurements: Goal: Will remain free from infection Outcome: Progressing Goal: Respiratory complications will improve Outcome: Progressing Goal: Cardiovascular complication will be avoided Outcome: Progressing   Problem: Activity: Goal: Risk for activity intolerance will decrease Outcome: Progressing   Problem: Pain Managment: Goal: General experience of comfort will improve Outcome: Progressing   Problem: Safety: Goal: Ability to remain free from injury will improve Outcome: Progressing   Problem: Skin Integrity: Goal: Risk for impaired skin integrity will decrease Outcome: Progressing   Problem: Education: Goal: Verbalization of understanding the information provided (i.e., activity precautions, restrictions, etc) will improve Outcome: Progressing   Problem: Activity: Goal: Ability to ambulate and perform ADLs will improve Outcome: Progressing   Problem: Clinical Measurements: Goal: Postoperative complications will be avoided or minimized Outcome: Progressing

## 2018-12-25 NOTE — H&P (Signed)

## 2018-12-25 NOTE — Progress Notes (Signed)
Pt due to void. NT bladder scanned pt and got 502. In and out preformed and 523mL was obtain. UA sent off to lab.   Will continue to monitor.

## 2018-12-25 NOTE — Transfer of Care (Signed)
Immediate Anesthesia Transfer of Care Note  Patient: [name]  Procedure(s) Performed: INTRAMEDULLARY (IM) NAIL INTERTROCHANTRIC (Right Leg Upper)  Patient Location: PACU  Anesthesia Type:General  Level of Consciousness: drowsy and patient cooperative  Airway & Oxygen Therapy: Patient Spontanous Breathing and Patient connected to face mask oxygen  Post-op Assessment: Report given to RN and Post -op Vital signs reviewed and stable  Post vital signs: Reviewed and stable  Last Vitals:  Vitals Value Taken Time  BP 119/66 12/25/18 1456  Temp    Pulse 78 12/25/18 1457  Resp 18 12/25/18 1458  SpO2 98 % 12/25/18 1457  Vitals shown include unvalidated device data.  Last Pain:  Vitals:   12/25/18 0937  TempSrc:   PainSc: 8       Patients Stated Pain Goal: 3 ([id number])  Complications: No apparent anesthesia complications

## 2018-12-25 NOTE — Progress Notes (Signed)
Initial Nutrition Assessment  DOCUMENTATION CODES:   Obesity unspecified  INTERVENTION:   -Once diet is advanced, add:  -MVI with minerals daily -Ensure Enlive po daily, each supplement provides 350 kcal and 20 grams of protein  NUTRITION DIAGNOSIS:   Increased nutrient needs related to post-op healing as evidenced by estimated needs.  GOAL:   Patient will meet greater than or equal to 90% of their needs  MONITOR:   PO intake, Supplement acceptance, Diet advancement, Labs, Weight trends, Skin, I & O's  REASON FOR ASSESSMENT:   Malnutrition Screening Tool    ASSESSMENT:   [name] is a 53 y.o. female with medical history significant of asthma, COPD, stage IV CKD, morbid obesity, depression, anxiety, hypertension, insulin-dependent diabetes mellitus, left-sided DKA, diabetic neuropathy, GERD, HIV, hyperlipidemia was at home today and was trying to get up from the commode reports feeling dizzy and fell on her right side.  She reports pain on the right hip.  On arrival to ED she underwent x-rays of the right hip and was found to have a Possible left pelvic bone fractures. Probable intertrochanteric fracture of the proximal right femur. As per the patient, she denied hitting her head or syncope. She reports she was trying to get up from the commode after BM, felt dizzy and fell on the right side. She denies any chest pain or sob, fever, chills, nausea, vomiting , abdominal pain, headache, tingling or numbness, diarrhea, dysuria, hematochezia, or hematemesis. She denies any blurry vision at this time. Orthopedics consulted and she was referred to medical service for admission.  Pt admitted with closed rt hip fracture.   Reviewed I/O's: +3.6 L x 24 hours  Pt NPO for surgical fixation today.   Pt sleeping soundly at time of visit and did not respond to voice. Noted pt consumed Ensure pre-surgery supplement.   Reviewed wt hx; wt has been stable over the past year.  Pt with  increased nutrient needs for post-operative healing and would benefit form addition of oral nutrition supplements.  Medications reviewed and include 0.9% sodium chloride infusion @ 100 ml/hr.   Lab Results  Component Value Date   HGBA1C 7.1 (H) 12/24/2018   PTA DM medications are 10 mg empagliflozin daily, 50 units insulin glargine BID, and 40 units U-500 BID.   Labs reviewed: CBGS: 136 (inpatient orders for glycemic control are 0-5 units insulin aspart q HS, 0-9 units insulin aspart TID, and 40 units insulin glargine BID).   NUTRITION - FOCUSED PHYSICAL EXAM:    Most Recent Value  Orbital Region  No depletion  Upper Arm Region  No depletion  Thoracic and Lumbar Region  No depletion  Buccal Region  No depletion  Temple Region  Mild depletion  Clavicle Bone Region  No depletion  Clavicle and Acromion Bone Region  No depletion  Scapular Bone Region  No depletion  Dorsal Hand  No depletion  Patellar Region  No depletion  Anterior Thigh Region  No depletion  Posterior Calf Region  No depletion  Edema (RD Assessment)  Mild  Hair  Reviewed  Eyes  Reviewed  Mouth  Reviewed  Skin  Reviewed  Nails  Reviewed       Diet Order:   Diet Order            Diet NPO time specified Except for: Sips with Meds  Diet effective midnight              EDUCATION NEEDS:   No education needs  have been identified at this time  Skin:  Skin Assessment: Skin Integrity Issues: Skin Integrity Issues:: Other (Comment) Other: MASD groin  Last BM:  12/24/18  Height:   Ht Readings from Last 1 Encounters:  12/24/18 5\' 10"  (1.778 m)    Weight:   Wt Readings from Last 1 Encounters:  12/24/18 106.6 kg    Ideal Body Weight:  68 kg  BMI:  Body mass index is 33.72 kg/m.  Estimated Nutritional Needs:   Kcal:  1850-2050  Protein:  100-115 grams  Fluid:  > 1.8 L    Kourosh Jablonsky A. Jimmye Norman, RD, LDN, [place] Registered Dietitian II Certified Diabetes Care and Education Specialist Pager:  [phone] After hours Pager: [phone]

## 2018-12-25 NOTE — Anesthesia Postprocedure Evaluation (Signed)
Anesthesia Post Note  Patient: [name]  Procedure(s) Performed: INTRAMEDULLARY (IM) NAIL INTERTROCHANTRIC (Right Leg Upper)     Patient location during evaluation: PACU Anesthesia Type: General Level of consciousness: awake and alert Pain management: pain level controlled Vital Signs Assessment: post-procedure vital signs reviewed and stable Respiratory status: spontaneous breathing, nonlabored ventilation, respiratory function stable and patient connected to nasal cannula oxygen Cardiovascular status: blood pressure returned to baseline and stable Postop Assessment: no apparent nausea or vomiting Anesthetic complications: no    Last Vitals:  Vitals:   12/25/18 1540 12/25/18 1555  BP: (!) 119/51 (!) 127/50  Pulse: 78 75  Resp: 16 17  Temp:  37.1 C  SpO2: 96% 97%    Last Pain:  Vitals:   12/25/18 1555  TempSrc:   PainSc: [place] DAVID

## 2018-12-26 ENCOUNTER — Inpatient Hospital Stay (HOSPITAL_COMMUNITY): Payer: Medicare HMO

## 2018-12-26 DIAGNOSIS — B20 Human immunodeficiency virus [HIV] disease: Secondary | ICD-10-CM

## 2018-12-26 DIAGNOSIS — R400 Somnolence: Secondary | ICD-10-CM

## 2018-12-26 DIAGNOSIS — J449 Chronic obstructive pulmonary disease, unspecified: Secondary | ICD-10-CM

## 2018-12-26 DIAGNOSIS — Z720 Tobacco use: Secondary | ICD-10-CM

## 2018-12-26 DIAGNOSIS — L8990 Pressure ulcer of unspecified site, unspecified stage: Secondary | ICD-10-CM | POA: Insufficient documentation

## 2018-12-26 LAB — CBC WITH DIFFERENTIAL/PLATELET
Abs Immature Granulocytes: 0.06 10*3/uL (ref 0.00–0.07)
Basophils Absolute: 0.1 10*3/uL (ref 0.0–0.1)
Basophils Relative: 1 %
Eosinophils Absolute: 1 10*3/uL — ABNORMAL HIGH (ref 0.0–0.5)
Eosinophils Relative: 8 %
HCT: 30.3 % — ABNORMAL LOW (ref 36.0–46.0)
Hemoglobin: 9.2 g/dL — ABNORMAL LOW (ref 12.0–15.0)
Immature Granulocytes: 1 %
Lymphocytes Relative: 11 %
Lymphs Abs: 1.4 10*3/uL (ref 0.7–4.0)
MCH: 24.3 pg — ABNORMAL LOW (ref 26.0–34.0)
MCHC: 30.4 g/dL (ref 30.0–36.0)
MCV: 80.2 fL (ref 80.0–100.0)
Monocytes Absolute: 0.8 10*3/uL (ref 0.1–1.0)
Monocytes Relative: 7 %
Neutro Abs: 8.6 10*3/uL — ABNORMAL HIGH (ref 1.7–7.7)
Neutrophils Relative %: 72 %
Platelets: 257 10*3/uL (ref 150–400)
RBC: 3.78 MIL/uL — ABNORMAL LOW (ref 3.87–5.11)
RDW: 22.1 % — ABNORMAL HIGH (ref 11.5–15.5)
WBC: 11.9 10*3/uL — ABNORMAL HIGH (ref 4.0–10.5)
nRBC: 0 % (ref 0.0–0.2)

## 2018-12-26 LAB — BLOOD GAS, ARTERIAL
Acid-Base Excess: 2.3 mmol/L — ABNORMAL HIGH (ref 0.0–2.0)
Acid-Base Excess: 2 mmol/L (ref 0.0–2.0)
Bicarbonate: 27.3 mmol/L (ref 20.0–28.0)
Bicarbonate: 27.7 mmol/L (ref 20.0–28.0)
Drawn by: 441351
Drawn by: 535721
FIO2: 36
FIO2: 40
O2 Saturation: 89.6 %
O2 Saturation: 91.3 %
Patient temperature: 39
Patient temperature: 37
pCO2 arterial: 55.8 mmHg — ABNORMAL HIGH (ref 32.0–48.0)
pCO2 arterial: 56.9 mmHg — ABNORMAL HIGH (ref 32.0–48.0)
pH, Arterial: 7.323 — ABNORMAL LOW (ref 7.350–7.450)
pH, Arterial: 7.308 — ABNORMAL LOW (ref 7.350–7.450)
pO2, Arterial: 68.7 mmHg — ABNORMAL LOW (ref 83.0–108.0)
pO2, Arterial: 66.8 mmHg — ABNORMAL LOW (ref 83.0–108.0)

## 2018-12-26 LAB — RENAL FUNCTION PANEL
Albumin: 1.8 g/dL — ABNORMAL LOW (ref 3.5–5.0)
Anion gap: 10 (ref 5–15)
BUN: 12 mg/dL (ref 6–20)
CO2: 25 mmol/L (ref 22–32)
Calcium: 7.7 mg/dL — ABNORMAL LOW (ref 8.9–10.3)
Chloride: 102 mmol/L (ref 98–111)
Creatinine, Ser: 1.21 mg/dL — ABNORMAL HIGH (ref 0.44–1.00)
GFR calc Af Amer: 59 mL/min — ABNORMAL LOW (ref >60)
GFR calc non Af Amer: 51 mL/min — ABNORMAL LOW (ref >60)
Glucose, Bld: 139 mg/dL — ABNORMAL HIGH (ref 70–99)
Phosphorus: 2.9 mg/dL (ref 2.5–4.6)
Potassium: 3.7 mmol/L (ref 3.5–5.1)
Sodium: 137 mmol/L (ref 135–145)

## 2018-12-26 LAB — GLUCOSE, CAPILLARY
Glucose-Capillary: 144 mg/dL — ABNORMAL HIGH (ref 70–99)
Glucose-Capillary: 113 mg/dL — ABNORMAL HIGH (ref 70–99)
Glucose-Capillary: 98 mg/dL (ref 70–99)
Glucose-Capillary: 100 mg/dL — ABNORMAL HIGH (ref 70–99)
Glucose-Capillary: 126 mg/dL — ABNORMAL HIGH (ref 70–99)

## 2018-12-26 LAB — LACTIC ACID, PLASMA
Lactic Acid, Venous: 1.2 mmol/L (ref 0.5–1.9)
Lactic Acid, Venous: 0.8 mmol/L (ref 0.5–1.9)

## 2018-12-26 LAB — HEPATIC FUNCTION PANEL
ALT: 9 U/L (ref 0–44)
AST: 19 U/L (ref 15–41)
Albumin: 1.7 g/dL — ABNORMAL LOW (ref 3.5–5.0)
Alkaline Phosphatase: 124 U/L (ref 38–126)
Bilirubin, Direct: <0.1 mg/dL (ref 0.0–0.2)
Total Bilirubin: 0.2 mg/dL — ABNORMAL LOW (ref 0.3–1.2)
Total Protein: 5.6 g/dL — ABNORMAL LOW (ref 6.5–8.1)

## 2018-12-26 LAB — AMMONIA: Ammonia: 42 umol/L — ABNORMAL HIGH (ref 9–35)

## 2018-12-26 LAB — MAGNESIUM: Magnesium: 1.5 mg/dL — ABNORMAL LOW (ref 1.7–2.4)

## 2018-12-26 MED ORDER — SODIUM CHLORIDE 0.9 % IV BOLUS: 500.0000 mL | Freq: Once | INTRAVENOUS | Status: DC

## 2018-12-26 MED ORDER — MAGNESIUM SULFATE 2 GM/50ML IV SOLN
2.0000 g | Freq: Once | INTRAVENOUS | Status: AC
  Administered 2018-12-26: 2 g via INTRAVENOUS
  Filled 2018-12-26: qty 50

## 2018-12-26 MED ORDER — NALOXONE HCL 0.4 MG/ML IJ SOLN: 0.4000 mg | INTRAMUSCULAR | Status: DC | PRN

## 2018-12-26 MED ORDER — IBUPROFEN 600 MG PO TABS
600.0000 mg | ORAL_TABLET | Freq: Three times a day (TID) | ORAL | Status: DC | PRN
  Administered 2018-12-30: 600 mg via ORAL
  Filled 2018-12-26: qty 3
  Filled 2018-12-26: qty 1

## 2018-12-26 MED ORDER — NALOXONE HCL 0.4 MG/ML IJ SOLN
INTRAMUSCULAR | Status: AC
  Filled 2018-12-26: qty 1

## 2018-12-26 MED ORDER — SODIUM CHLORIDE 0.9 % IV SOLN
500.0000 mg | INTRAVENOUS | Status: DC
  Administered 2018-12-26 – 2018-12-28 (×3): 500 mg via INTRAVENOUS
  Filled 2018-12-26 (×4): qty 500

## 2018-12-26 NOTE — Progress Notes (Signed)
Pt transferred to [place] for progressive care.  Pt on bipap during transport.  No issues during transport.  Pt is tolerating well.  Vital signs normal.  RT will continue to monitor and obtain a ABG.

## 2018-12-26 NOTE — Progress Notes (Signed)
PT Cancellation Note  Patient Details Name: [name] MRN: [id number] DOB: 03-22-1965   Cancelled Treatment:    Reason Eval/Treat Not Completed: (P) Medical issues which prohibited therapy Rapid response, pt going on bipap, decreased cognition and transferring to step down. PT will follow back tomorrow for Evaluation.   Rossi Burdo B. Migdalia Dk PT, DPT Acute Rehabilitation Services Pager [phone] Office [phone]    [place] 12/26/2018, 8:52 AM

## 2018-12-26 NOTE — Progress Notes (Signed)
   Subjective:  Patient is sleeping.  No events.  Objective:   VITALS:   Vitals:   12/26/18 0850 12/26/18 0856 12/26/18 0900 12/26/18 1130  BP: 104/66 103/74 (!) 109/56   Pulse:  (!) 102 100 91  Resp:    18  Temp:      TempSrc:      SpO2:  100% 100% 95%  Weight:      Height:        Dressings c/d/i Thigh soft   Lab Results  Component Value Date   WBC 11.9 (H) 12/26/2018   HGB 9.2 (L) 12/26/2018   HCT 30.3 (L) 12/26/2018   MCV 80.2 12/26/2018   PLT 257 12/26/2018     Assessment/Plan:  1 Day Post-Op   - Expected postop acute blood loss anemia - will monitor for symptoms - Up with PT/OT - DVT ppx - SCDs, ambulation, lovenox - WBAT operative extremity   [name] 12/26/2018, 12:22 PM [phone]

## 2018-12-26 NOTE — Significant Event (Signed)
Rapid Response Note  Overview: Probable sepsis  Initial Focused Assessment: I was notified by the nursing staff regarding [name] and her worsening breath sounds. Clance Boll NP at bedside. Upon arrival, [name] is not in distress. She is arousable to loud voice, oriented to self and situation, but drifts off to sleep when not stimulated. She has equal strength in BUE but generally weak. She is febrile at 102.3 F. BBS Rhonchi with diminished bases. Skin is hot, flushed pink and dry. Right hip dressings CDI. Hr 124 ST with BBB, 109/63 (78), RR 21 with sats 93-94% on 4L [place]. Pt has a productive cough but is weak and cannot mobilize her secretions consistently. CBG 114. Hydrocodone 7.5/325 last given at 2231 12/24. Orders received for ABG, PCXR. LA 1.2.  Interventions: -No acute interventions by myself  Plan of Care (if not transferred): - Follow MEWS guidelines for RED (q31min VSx4, q38minx2) - Monitor respiratory status and treat fever -Notify primary svc and/or RRRN if further assistance is needed.   Event Summary: Call received 0445 Arrived at call [place] Call ended [phone]  [name]

## 2018-12-26 NOTE — Progress Notes (Addendum)
0730 Pt is lethargic, slow response to verbal stimuli. On O2 at 3L via nasal cannula, mouth breathing noted. V/S checked. RRT [place], came in to assessed and stayed with the pt. Dr Marylyn Ishihara notified by Emilie Rutter. Pt is now on Bipap. Will transfer pt to 6E05. Report was given to Dina Rich RN. Pt's son Milagros Reap notified of pt's status and the room change.

## 2018-12-26 NOTE — Progress Notes (Signed)
Shift event: RN paged earlier that pt was more lethargic and confused. NP to bedside. At 0500.  S: Per RN, pt was able to take her po meds at hs. One of those meds were xanax. Last had Vicodin around 2230 last night. Last MSO4 at 1700 hrs yesterday. She was talking and alert earlier in shift. Per RN, she was in and out cathed around 11pm and hasn't had any UO since.  It is difficult to obtain a complete ROS secondary to lethargy. She endorses pain with hip. No abdominal or chest pain. No SOB.  O: Lethargic, morbidly obese female in no acute distress. BP 109/63  93/67. HR 120s, ST. RR 21 SaO2 88-94% on 2-4L. On 4L at NP visit. Face is flushed. Skin hot so temp retaken. 102.3 F rectally.  Weak cough. Does not gag with [place] suctioning. Card: S1S2, tachycardic. Lungs: normal resp effort. Congested and rhonchus. Abdomen: obese, BS x 4. ND, NT. Neuro exam non focal. Follows commands but slowly.  A/P: 1. Increased lethargy during sleep hours-lilkely OSA. ? Need CPAP. ABG was baseline looking back to her previous ones. CXR with atelectasis and ? early infection. Given fever, lung sounds, and bump in WBCC, started abx for CAP. LA 1.2. RRRN had pt do IS while there. Spoke to her about need to do IS.  D/c'd Xanax.  2. Hypomagnesia-Mg was 1.2 yesterday, recheck low, Mg replacement ordered.  3. Low UO and fever-dehydration. Gave bolus.  4. ? Early PNA and known UTI-already on Rocephin and added Zithromycin.  Report on pt given to oncoming attending.  KJKG, NP [facility] Performed by: Gardiner Barefoot Total critical care time: start 0505  End 0605 for 60  minutes  Critical care time was exclusive of separately billable procedures and treating other patients.  Critical care was necessary to treat or prevent imminent or life-threatening deterioration.  Critical care was time spent personally by me on the following activities: development of treatment plan with patient and/or surrogate as well as  nursing, discussions with consultants, evaluation of patient's response to treatment, examination of patient, obtaining history from patient or surrogate, ordering and performing treatments and interventions, ordering and review of laboratory studies, ordering and review of radiographic studies, pulse oximetry and re-evaluation of patient's condition.

## 2018-12-26 NOTE — Progress Notes (Signed)
Hospitalist aware of all details throughout the night, all changes in MEWS score.   Called rapid response nurse, Shanon Brow stated their protocol did not match her, so she called Dr. Sharlet Salina who then called Dr. Baltazar Najjar to the flow.  Pt ran fever 103.2 (rectal), pt became disoriented. Dr. Baltazar Najjar saw patient, changed orders, ABGs obtain, xray obtain. Pt extremely congested. Oral suction needed.   Handed all info of to [employer]

## 2018-12-26 NOTE — Progress Notes (Signed)
OT Cancellation Note  Patient Details Name: [name] MRN: [id number] DOB: 1965-03-21   Cancelled Treatment:    Reason Eval/Treat Not Completed: Medical issues which prohibited therapy. Rapid response, pt going on bipap, decreased cognition and transferring to step down.  [place], OTR/L Acute Rehab Services Pager [phone] Office [phone]    Almon Register 12/26/2018, 8:44 AM

## 2018-12-26 NOTE — Progress Notes (Signed)
RT NOTE: RT placed patient on 3L [place]. Patient states that her breathing is comfortable off the bipap and that she does not feel short of breath. RT will continue to monitor as needed.

## 2018-12-26 NOTE — Significant Event (Addendum)
Rapid Response Event Note  Overview: Neurologic/Respiratory/Sepsis  Initial Focused Assessment: I came up to see [name] as a follow up at [place]. Patient was somnolent, she arouse to noxious stimuli, would briefly nod her head, and then would drift back asleep. Temperature 100.2 ([name]), HR low 100s, SBP 80-100s, MAP > 65, RR 12 - shallow and slow, lung sounds quite diminished in the bases, rhonchi scattered throughout upper fields. 87% on 4L [place]. Trace edema in extremities, surgical site dressing - C/D/I. I updated the MD as well  Interventions: -- Narcan 0.4 mg IV x 1 - no signficant in neurologic status after Narcan -- BIPAP -- Motrin 600 mg PRN for fever -- STAT Labs  Plan of Care: -- Transfer to PCU -- Repeat ABG 1 hour BIPAP is started -- Patient transferred to [facility] MD came to see patient as well. RT obtaining ABG, keep on BIPAP until mental status for now and until ABG results.  -- 1345 -- called for an update, patient awakes a little more per nurse. Leave on BIPAP, reassess in an hour, if she still is sleepy then perhaps another ABG.  --1730 -- patient was much more awake, requesting to drink something. I called the MD and updated him, RT to take patient off BIPAP, RN instructed to let patient have clears for now, incase patient needs to go back BIPAP  Event Summary:  Start Time 0745 End Time [name]

## 2018-12-26 NOTE — Progress Notes (Signed)
[name]  PROGRESS NOTE    [name]  [id number] DOB: May 11, 1965 DOA: 12/24/2018 PCP: [name], PA-C   Brief Narrative:   Erin Reller Pauldingis a 53 y.o.femalewith medical history significant ofasthma, COPD, stage IV CKD, morbid obesity, depression, anxiety, hypertension, insulin-dependent diabetes mellitus, left-sided DKA, diabetic neuropathy, GERD, HIV, hyperlipidemia was at home today and was trying to get up from the commode reports feeling dizzy and fell on her right side. She reports pain on the right hip. On arrival to ED she underwent x-rays of the right hip and was found to have a Possible left pelvic bone fractures. Probable intertrochanteric fracture of the proximal right femur. As per the patient, she denied hitting her head or syncope. She reports she was trying to get up from the commode after BM, felt dizzy and fell on the right side. She denies any chest pain or sob, fever, chills, nausea, vomiting , abdominal pain, headache, tingling or numbness, diarrhea, dysuria, hematochezia, or hematemesis. She denies any blurry vision at this time.  Orthopedics consulted and she was referred to medical service for admission.  12/26/18: More lethargic ON and this AM. Moved to progressive and placed on BiPap. Ammonia only mildly elevated. Fevers noted. UA weakly positive and CXR suspicious for early infection. Rocephin, zithro started. Continue for now. Repeat ABG closer to shift change. She is a little more alert now.   Assessment & Plan:   Principal Problem:   Displaced intertrochanteric fracture of right femur, initial encounter for closed fracture ([facility]) Active Problems:   Pressure injury of skin  Intertrochanteric fracture of the proximal right femur:     - ortho onboard     - s/p  Open treatment of intertrochanteric fracture with intramedullary implant     - Pain control; watch narcotics and sedating meds d/t lethergy   Dizziness and fall     - Possibly  secondary to hypotension.     - echo: Left Ventricle: Left ventricular ejection fraction, by visual estimation, is 60 to 65%. The left ventricle has normal function. The left ventricle has no regional wall motion abnormalities. The left ventricular internal cavity size was the left  ventricle is normal in size. There is mildly increased left ventricular hypertrophy. Left ventricular diastolic parameters were normal.Right Ventricle: The right ventricular size is mildly enlarged. No increase in right ventricular wall thickness. Global RV systolic function is has normal systolic function.     - negative trp  Hypotension      - Probably dehydration.      - Hold home BP meds.      - BP ok; continuing fluids  IDDM complicated by diabetic neuropathy     - SSI; monitor     - A1c 7.1  Hyperlipidemia      - crestor  CKD IIIa     - Baseline creatinine ranging between 1 to 1.4. currently its at 1.4  Pyuria     - UCx pending     - started on rocephin  HIV      - biktarvy  COPD and Asthma: CXR     - flonase, breo ellipta     - PRN albuterol, duonebs      - on rocephin, azithro     - Bipap d/t lethergy, hypoxia  Tobacco abuse:      - Cessation counseling provided.      - Start her on nicotine patch.      - UDS pending   Left rib fracture:      -  Pain control and incentive spirometry.   Hypomagnesemia     - replete, monitor  Low UOP     - add fluids     - check renal/bladder US  Lethargy     - more lethargic this AM     - question infection vs hypoxia     - LFT ok; but mildly elevated ammonia; can consider a dose of lactulose     - mentation is improving slightly with BiPap; monitor     - avoid sedating meds/narcotics  DVT prophylaxis: lovenox Code Status: FULL   Disposition Plan: TBD  Consultants:   Ortho  Antimicrobials:   Rocephin, zithromycin   Subjective: "I'm ok."  Objective: Vitals:   12/26/18 0850 12/26/18 0856 12/26/18 0900 12/26/18 1130    BP: 104/66 103/74 (!) 109/56   Pulse:  (!) 102 100 91  Resp:    18  Temp:      TempSrc:      SpO2:  100% 100% 95%  Weight:      Height:        Intake/Output Summary (Last 24 hours) at 12/26/2018 1417 Last data filed at 12/25/2018 2231 Gross per 24 hour  Intake 1520.48 ml  Output 950 ml  Net 570.48 ml   Filed Weights   12/24/18 1237 12/25/18 1219  Weight: 106.6 kg 106.6 kg    Examination:  General: 53 y.o. female resting in bed in NAD Cardiovascular: RRR, +S1, S2, no m/g/r Respiratory: upper airway transmission, some upper right lung rhonchi, on BiPap GI: BS+, NDNT, no masses noted, no organomegaly noted MSK: No e/c/c Neuro: alert to name, following some commands, lethargic   Data Reviewed: I have personally reviewed following labs and imaging studies.  CBC: Recent Labs  Lab 12/24/18 1300 12/25/18 1106 12/25/18 1415 12/26/18 0403  WBC 11.6* 9.7  --  11.9*  NEUTROABS 10.4* 8.1*  --  8.6*  HGB 12.8 10.7* 11.2* 9.2*  HCT 43.2 35.3* 33.0* 30.3*  MCV 81.1 81.0  --  80.2  PLT 296 238  --  [id number]   Basic Metabolic Panel: Recent Labs  Lab 12/24/18 1300 12/25/18 1106 12/25/18 1415 12/26/18 0403  NA 140 138 138 137  K 4.2 3.7 3.8 3.7  CL 99 101  --  102  CO2 27 26  --  25  GLUCOSE 196* 90  --  139*  BUN 9 13  --  12  CREATININE 1.33* 1.44*  --  1.21*  CALCIUM 8.8* 8.0*  --  7.7*  MG  --  1.2*  --  1.5*  PHOS  --  4.1  --  2.9   GFR: Estimated Creatinine Clearance: 70.2 mL/min (A) (by C-G formula based on SCr of 1.21 mg/dL (H)). Liver Function Tests: Recent Labs  Lab 12/24/18 1300 12/25/18 1106 12/26/18 0403 12/26/18 0827  AST 21  --   --  19  ALT 15  --   --  9  ALKPHOS 187*  --   --  124  BILITOT 1.0  --   --  0.2*  PROT 7.0  --   --  5.6*  ALBUMIN 2.4* 2.0* 1.8* 1.7*   No results for input(s): LIPASE, AMYLASE in the last 168 hours. Recent Labs  Lab 12/26/18 0827  AMMONIA 42*   Coagulation Profile: No results for input(s): INR, PROTIME in  the last 168 hours. Cardiac Enzymes: No results for input(s): CKTOTAL, CKMB, CKMBINDEX, TROPONINI in the last 168 hours. BNP (last 3 results) No results  for input(s): PROBNP in the last 8760 hours. HbA1C: Recent Labs    12/24/18 1830  HGBA1C 7.1*   CBG: Recent Labs  Lab 12/25/18 1702 12/25/18 2053 12/26/18 0458 12/26/18 0637 12/26/18 1228  GLUCAP 82 85 144* 113* 98   Lipid Profile: No results for input(s): CHOL, HDL, LDLCALC, TRIG, CHOLHDL, LDLDIRECT in the last 72 hours. Thyroid Function Tests: No results for input(s): TSH, T4TOTAL, FREET4, T3FREE, THYROIDAB in the last 72 hours. Anemia Panel: No results for input(s): VITAMINB12, FOLATE, FERRITIN, TIBC, IRON, RETICCTPCT in the last 72 hours. Sepsis Labs: Recent Labs  Lab 12/24/18 1300 12/24/18 1540 12/26/18 0526 12/26/18 0827  LATICACIDVEN 1.7 0.9 1.2 0.8    Recent Results (from the past 240 hour(s))  Blood culture (routine x 2)     Status: None (Preliminary result)   Collection Time: 12/24/18 12:39 PM   Specimen: BLOOD  Result Value Ref Range Status   Specimen Description BLOOD SITE NOT SPECIFIED  Final   Special Requests   Final    BOTTLES DRAWN AEROBIC AND ANAEROBIC Blood Culture adequate volume   Culture   Final    NO GROWTH 2 DAYS Performed at [facility], [address], [place], [place] [address]    Report Status PENDING  Incomplete  Blood culture (routine x 2)     Status: None (Preliminary result)   Collection Time: 12/24/18  1:37 PM   Specimen: BLOOD  Result Value Ref Range Status   Specimen Description BLOOD SITE NOT SPECIFIED  Final   Special Requests   Final    BOTTLES DRAWN AEROBIC AND ANAEROBIC Blood Culture adequate volume   Culture   Final    NO GROWTH 2 DAYS Performed at [facility], [address], [place], [place] [address]    Report Status PENDING  Incomplete  SARS CORONAVIRUS 2 (TAT 6-24 HRS) Nasopharyngeal Nasopharyngeal Swab     Status: None   Collection Time:  12/24/18  2:21 PM   Specimen: Nasopharyngeal Swab  Result Value Ref Range Status   SARS Coronavirus 2 NEGATIVE NEGATIVE Final    Comment: (NOTE) SARS-CoV-2 target nucleic acids are NOT DETECTED. The SARS-CoV-2 RNA is generally detectable in upper and lower respiratory specimens during the acute phase of infection. Negative results do not preclude SARS-CoV-2 infection, do not rule out co-infections with other pathogens, and should not be used as the sole basis for treatment or other patient management decisions. Negative results must be combined with clinical observations, patient history, and epidemiological information. The expected result is Negative. Fact Sheet for Patients: SugarRoll.be Fact Sheet for Healthcare Providers: https://www.woods-mathews.com/ This test is not yet approved or cleared by the [place] FDA and  has been authorized for detection and/or diagnosis of SARS-CoV-2 by FDA under an Emergency Use Authorization (EUA). This EUA will remain  in effect (meaning this test can be used) for the duration of the COVID-19 declaration under Section 56 4(b)(1) of the Act, 21 U.S.C. section 360bbb-3(b)(1), unless the authorization is terminated or revoked sooner. Performed at [facility], [place] [address], [place], [place] [address]   Surgical pcr screen     Status: None   Collection Time: 12/24/18 10:13 PM   Specimen: Nasal Mucosa; Nasal Swab  Result Value Ref Range Status   MRSA, PCR NEGATIVE NEGATIVE Final   Staphylococcus aureus NEGATIVE NEGATIVE Final    Comment: (NOTE) The Xpert SA Assay (FDA approved for NASAL specimens in patients 38 years of age and older),  is one component of a comprehensive surveillance program. It is not intended to diagnose infection nor to guide or monitor treatment. Performed at [facility], [place] [address], [place], [place] [address]       Radiology Studies: CT PELVIS WO  CONTRAST  Result Date: 12/25/2018 CLINICAL DATA:  Pelvic fracture EXAM: CT PELVIS WITHOUT CONTRAST TECHNIQUE: Multidetector CT imaging of the pelvis was performed following the standard protocol without intravenous contrast. COMPARISON:  None. FINDINGS: Urinary Tract: The visualized distal ureters and bladder appear unremarkable. Bowel: No bowel wall thickening, distention or surrounding inflammation identified within the pelvis. Scattered colonic diverticula are noted. Vascular/Lymphatic: No enlarged pelvic lymph nodes identified. Scattered atherosclerosis is noted. Reproductive: The uterus and adnexa are unremarkable. Other: There is diffuse dependent subcutaneous edema. Musculoskeletal: There is a comminuted displaced right intratrochanteric and proximal femoral shaft. There is approximately a a 2 cm of posterior displacement of the femoral shaft. The femoral head appears to be anteriorly rotated, however still articulates with the acetabulum. There is diffuse osteopenia. The patient has had prior cerclage fixation and healed fracture deformity of the right proximal femur. There is fragmentation with sclerosis of the superior left femoral head, likely chronic avascular necrosis. No femoral head collapse however is noted. The sacroiliac joints appear to be intact. IMPRESSION: 1. Comminuted displaced right intratrochanteric fracture involving the proximal femoral shaft. Approximately 2 cm posterior displacement of the femoral shaft. 2. Findings suggestive of chronic avascular necrosis of the left superior femoral head. 3. Diffuse osteopenia. Electronically Signed   By: Prudencio Pair M.D.   On: 12/25/2018 00:28   DG Pelvis Portable  Result Date: 12/25/2018 CLINICAL DATA:  Post RIGHT femoral nail, RIGHT femur internally rotated and resistant to movement EXAM: PORTABLE PELVIS 1-2 VIEWS COMPARISON:  Portable exam 1515 hours compared to 12/24/2018 FINDINGS: RIGHT femoral nail has been placed with 2 screws across  the intertrochanteric fracture into the RIGHT femoral head. Diffuse osseous demineralization. No dislocation. Visualized pelvis appears grossly intact. Old healed deformity of the proximal LEFT femur with sclerosis of the LEFT femoral head corresponding to sclerosis and fragmentation of the superior LEFT femoral head and suspected underlying avascular necrosis. IMPRESSION: Post ORIF of intertrochanteric fracture RIGHT femur. Osseous demineralization with posttraumatic deformity and suspect underlying avascular necrosis at the LEFT femoral head. Electronically Signed   By: Lavonia Dana M.D.   On: 12/25/2018 16:16   DG CHEST PORT 1 VIEW  Result Date: 12/26/2018 CLINICAL DATA:  Hypoxia and fever, lethargic. EXAM: PORTABLE CHEST 1 VIEW COMPARISON:  12/24/2018, 06/02/2018 and 01/05/2018 FINDINGS: Lungs are hypoinflated with minimal opacification over the left retrocardiac region which may be due to vascular crowding, atelectasis or early infection. No evidence of effusion. Cardiomediastinal silhouette is within normal. There are multiple bilateral rib fractures. IMPRESSION: Hypoinflated lungs with mild opacification in the left retrocardiac region which may be due to early infection, vascular crowding or atelectasis. Electronically Signed   By: Marin Olp M.D.   On: 12/26/2018 05:57   DG C-Arm 1-60 Min  Result Date: 12/25/2018 CLINICAL DATA:  Internal fixation EXAM: RIGHT FEMUR 2 VIEWS; DG C-ARM 1-60 MIN COMPARISON:  12/24/2018 FINDINGS: Interval internal fixation across the right femoral intertrochanteric fracture. Near anatomic alignment. No hardware bony complicating feature. IMPRESSION: Internal fixation across right intertrochanteric fracture. No visible complicating feature. Electronically Signed   By: Rolm Baptise M.D.   On: 12/25/2018 14:46   ECHOCARDIOGRAM COMPLETE  Result Date: 12/24/2018   ECHOCARDIOGRAM REPORT   Patient Name:  Nadean Corwin Date of Exam: 12/24/2018 Medical Rec #:   [id number]         Height:       70.0 in Accession #:    [id number]        Weight:       235.0 lb Date of Birth:  December 27, 1965         BSA:          2.24 m Patient Age:    66 years          BP:           117/75 mmHg Patient Gender: F                 HR:           83 bpm. Exam Location:  Inpatient Procedure: 2D Echo, Color Doppler and Cardiac Doppler STAT ECHO Indications:    R94.31 Abnormal EKG, Dizziness  History:        Patient has prior history of Echocardiogram examinations, most                 recent 08/30/2017. CAD, COPD; Risk Factors:Hypertension, Diabetes                 and Dyslipidemia.  Sonographer:    Raquel Sarna Senior RDCS Referring Phys: [phone] Marylynn Pearson XU  Sonographer Comments: Technically difficult study due to patient body habitus. IMPRESSIONS  1. Left ventricular ejection fraction, by visual estimation, is 60 to 65%. The left ventricle has normal function. There is mildly increased left ventricular hypertrophy.  2. Global right ventricle has normal systolic function.The right ventricular size is mildly enlarged.  3. Left atrial size was normal.  4. Right atrial size was normal.  5. The mitral valve is normal in structure. No evidence of mitral valve regurgitation.  6. The tricuspid valve is normal in structure.  7. The aortic valve is tricuspid. Aortic valve regurgitation is not visualized. No evidence of aortic valve stenosis.  8. The pulmonic valve was not well visualized. Pulmonic valve regurgitation is not visualized.  9. The inferior vena cava is normal in size with greater than 50% respiratory variability, suggesting right atrial pressure of 3 mmHg. FINDINGS  Left Ventricle: Left ventricular ejection fraction, by visual estimation, is 60 to 65%. The left ventricle has normal function. The left ventricle has no regional wall motion abnormalities. The left ventricular internal cavity size was the left ventricle is normal in size. There is mildly increased left ventricular hypertrophy. Left ventricular  diastolic parameters were normal. Right Ventricle: The right ventricular size is mildly enlarged. No increase in right ventricular wall thickness. Global RV systolic function is has normal systolic function. Left Atrium: Left atrial size was normal in size. Right Atrium: Right atrial size was normal in size Pericardium: Trivial pericardial effusion is present. Mitral Valve: The mitral valve is normal in structure. No evidence of mitral valve regurgitation. Tricuspid Valve: The tricuspid valve is normal in structure. Tricuspid valve regurgitation is not demonstrated. Aortic Valve: The aortic valve is tricuspid. Aortic valve regurgitation is not visualized. The aortic valve is structurally normal, with no evidence of sclerosis or stenosis. Pulmonic Valve: The pulmonic valve was not well visualized. Pulmonic valve regurgitation is not visualized. Pulmonic regurgitation is not visualized. Aorta: The aortic root is normal in size and structure. Venous: The inferior vena cava is normal in size with greater than 50% respiratory variability, suggesting right atrial pressure of 3 mmHg. IAS/Shunts: The interatrial septum was  not well visualized.  LEFT VENTRICLE PLAX 2D LVIDd:         4.60 cm  Diastology LVIDs:         3.30 cm  LV e' lateral:   6.53 cm/s LV PW:         0.90 cm  LV E/e' lateral: 9.4 LV IVS:        0.70 cm  LV e' medial:    5.44 cm/s LVOT diam:     2.00 cm  LV E/e' medial:  11.3 LV SV:         53 ml LV SV Index:   22.83 LVOT Area:     3.14 cm  RIGHT VENTRICLE RV S prime:     10.10 cm/s TAPSE (M-mode): 1.8 cm LEFT ATRIUM           Index      RIGHT ATRIUM           Index LA diam:      3.00 cm 1.34 cm/m RA Area:     16.00 cm LA Vol (A4C): 21.1 ml 9.44 ml/m RA Volume:   39.20 ml  17.54 ml/m  AORTIC VALVE LVOT Vmax:   71.70 cm/s LVOT Vmean:  54.000 cm/s LVOT VTI:    0.140 m  AORTA Ao Root diam: 2.90 cm MITRAL VALVE MV Area (PHT): 3.17 cm             SHUNTS MV PHT:        69.31 msec           Systemic VTI:  0.14  m MV Decel Time: 239 msec             Systemic Diam: 2.00 cm MV E velocity: 61.60 cm/s 103 cm/s MV A velocity: 83.40 cm/s 70.3 cm/s MV E/A ratio:  0.74       1.5  Oswaldo Milian MD Electronically signed by Oswaldo Milian MD Signature Date/Time: 12/24/2018/9:56:09 PM    Final    DG FEMUR, MIN 2 VIEWS RIGHT  Result Date: 12/25/2018 CLINICAL DATA:  Internal fixation EXAM: RIGHT FEMUR 2 VIEWS; DG C-ARM 1-60 MIN COMPARISON:  12/24/2018 FINDINGS: Interval internal fixation across the right femoral intertrochanteric fracture. Near anatomic alignment. No hardware bony complicating feature. IMPRESSION: Internal fixation across right intertrochanteric fracture. No visible complicating feature. Electronically Signed   By: Rolm Baptise M.D.   On: 12/25/2018 14:46   DG FEMUR PORT, MIN 2 VIEWS RIGHT  Result Date: 12/25/2018 CLINICAL DATA:  Post RIGHT femoral nail EXAM: RIGHT FEMUR PORTABLE 2 VIEW COMPARISON:  Portable exam 1532 hours compared to RIGHT hip radiographs of 12/24/2018 FINDINGS: IM nail with distal locking screw seen within the RIGHT femur. Diffuse osseous demineralization. Joint space narrowing RIGHT knee. No fracture or dislocation identified at the mid to distal femur. Intertrochanteric fracture RIGHT femur imaged and reported separately. IMPRESSION: Post placement of IM nail with distal locking screw. Osseous demineralization without additional femoral abnormality. Electronically Signed   By: Lavonia Dana M.D.   On: 12/25/2018 16:18     Scheduled Meds:  acetaminophen  500 mg Oral Q6H   bictegravir-emtricitabine-tenofovir AF  1 tablet Oral Daily   docusate sodium  100 mg Oral BID   enoxaparin (LOVENOX) injection  40 mg Subcutaneous Q24H   feeding supplement (ENSURE ENLIVE)  237 mL Oral QHS   fluticasone  1 spray Each Nare Daily   fluticasone furoate-vilanterol  1 puff Inhalation Daily   And   umeclidinium bromide  1 puff  Inhalation Daily   insulin aspart  0-5 Units  Subcutaneous QHS   insulin aspart  0-9 Units Subcutaneous TID WC   insulin glargine  40 Units Subcutaneous BID   multivitamin with minerals  1 tablet Oral Daily   naloxone       pantoprazole  40 mg Oral Daily   pregabalin  225 mg Oral BID   rosuvastatin  20 mg Oral Daily   sertraline  150 mg Oral Daily   Continuous Infusions:  sodium chloride 75 mL/hr at 12/25/18 2229   azithromycin 500 mg (12/26/18 0713)   cefTRIAXone (ROCEPHIN)  IV 1 g (12/26/18 0919)   methocarbamol (ROBAXIN) IV     sodium chloride       LOS: 2 days    Time spent: 35 minutes spent in the coordination of care today.    Jonnie Finner, DO [facility]  If 7PM-7AM, please contact night-coverage [email or web address] 12/26/2018, 2:17 PM

## 2018-12-27 ENCOUNTER — Inpatient Hospital Stay (HOSPITAL_COMMUNITY): Payer: Medicare HMO

## 2018-12-27 LAB — BASIC METABOLIC PANEL
Anion gap: 9 (ref 5–15)
BUN: 16 mg/dL (ref 6–20)
CO2: 25 mmol/L (ref 22–32)
Calcium: 7.5 mg/dL — ABNORMAL LOW (ref 8.9–10.3)
Chloride: 108 mmol/L (ref 98–111)
Creatinine, Ser: 1.02 mg/dL — ABNORMAL HIGH (ref 0.44–1.00)
GFR calc Af Amer: >60 mL/min (ref >60)
GFR calc non Af Amer: >60 mL/min (ref >60)
Glucose, Bld: 146 mg/dL — ABNORMAL HIGH (ref 70–99)
Potassium: 4 mmol/L (ref 3.5–5.1)
Sodium: 142 mmol/L (ref 135–145)

## 2018-12-27 LAB — BLOOD GAS, ARTERIAL
Acid-Base Excess: 2.4 mmol/L — ABNORMAL HIGH (ref 0.0–2.0)
Bicarbonate: 27.8 mmol/L (ref 20.0–28.0)
FIO2: 32
O2 Saturation: 93.2 %
Patient temperature: 36.2
pCO2 arterial: 52.7 mmHg — ABNORMAL HIGH (ref 32.0–48.0)
pH, Arterial: 7.337 — ABNORMAL LOW (ref 7.350–7.450)
pO2, Arterial: 67 mmHg — ABNORMAL LOW (ref 83.0–108.0)

## 2018-12-27 LAB — CBC
HCT: 26.8 % — ABNORMAL LOW (ref 36.0–46.0)
Hemoglobin: 8 g/dL — ABNORMAL LOW (ref 12.0–15.0)
MCH: 24.4 pg — ABNORMAL LOW (ref 26.0–34.0)
MCHC: 29.9 g/dL — ABNORMAL LOW (ref 30.0–36.0)
MCV: 81.7 fL (ref 80.0–100.0)
Platelets: 202 10*3/uL (ref 150–400)
RBC: 3.28 MIL/uL — ABNORMAL LOW (ref 3.87–5.11)
RDW: 22.4 % — ABNORMAL HIGH (ref 11.5–15.5)
WBC: 10.3 10*3/uL (ref 4.0–10.5)
nRBC: 0.2 % (ref 0.0–0.2)

## 2018-12-27 LAB — URINE CULTURE: Culture: >=100000 — AB

## 2018-12-27 LAB — GLUCOSE, CAPILLARY
Glucose-Capillary: 121 mg/dL — ABNORMAL HIGH (ref 70–99)
Glucose-Capillary: 68 mg/dL — ABNORMAL LOW (ref 70–99)
Glucose-Capillary: 108 mg/dL — ABNORMAL HIGH (ref 70–99)
Glucose-Capillary: 65 mg/dL — ABNORMAL LOW (ref 70–99)
Glucose-Capillary: 109 mg/dL — ABNORMAL HIGH (ref 70–99)

## 2018-12-27 NOTE — Progress Notes (Signed)
[name] Kitchen  PROGRESS NOTE    EKTA TESORIERO  [id number] DOB: 1965/01/26 DOA: 12/24/2018 PCP: [name], PA-C   Brief Narrative:   [name] a 53 y.o.femalewith medical history significant ofasthma, COPD, stage IV CKD, morbid obesity, depression, anxiety, hypertension, insulin-dependent diabetes mellitus, left-sided DKA, diabetic neuropathy, GERD, HIV, hyperlipidemia was at home today and was trying to get up from the commode reports feeling dizzy and fell on her right side. She reports pain on the right hip. On arrival to ED she underwent x-rays of the right hip and was found to have a Possible left pelvic bone fractures. Probable intertrochanteric fracture of the proximal right femur. As per the patient, she denied hitting her head or syncope. She reports she was trying to get up from the commode after BM, felt dizzy and fell on the right side. She denies any chest pain or sob, fever, chills, nausea, vomiting , abdominal pain, headache, tingling or numbness, diarrhea, dysuria, hematochezia, or hematemesis. She denies any blurry vision at this time.  Orthopedics consulted and she was referred to medical service for admission.  12/27/18: was able to move down to 3L. Opens eyes and follows some commands, however, she is still overall lethargic this AM. Will check a CTH. Repeat ammonia.    Assessment & Plan:   Principal Problem:   Displaced intertrochanteric fracture of right femur, initial encounter for closed fracture ([place]) Active Problems:   Pressure injury of skin  Intertrochanteric fracture of the proximal right femur: - ortho onboard - s/p Open treatment of intertrochanteric fracture with intramedullary implant - Pain control; watch narcotics and sedating meds d/t lethergy   Dizziness and fall - Possibly secondary to hypotension. - echo: Left Ventricle: Left ventricular ejection fraction, by visual estimation, is 60 to 65%. The left  ventricle has normal function. The left ventricle has no regional wall motion abnormalities. The left ventricular internal cavity size was the left  ventricle is normal in size. There is mildly increased left ventricular hypertrophy. Left ventricular diastolic parameters were normal.Right Ventricle: The right ventricular size is mildly enlarged. No increase in right ventricular wall thickness. Global RV systolic function is has normal systolic function. - negative trp  Hypotension  - Probably dehydration.  - Hold home BP meds.      - BP ok; continuing fluids  IDDM complicated by diabetic neuropathy - SSI; monitor - A1c 7.1  Hyperlipidemia  - crestor  CKD IIIa - Baseline creatinine ranging between 1 to 1.4. currently its at 1.02  Pyuria - UCx no growth thus far - started on rocephin; continue for now  HIV  - biktarvy  COPD and Asthma: - flonase, breo ellipta - PRN albuterol, duonebs      - CXR with possible early infection     - on rocephin, azithro (end tomorrow 12/27)     - Bipap d/t lethergy, hypoxia  Tobacco abuse:  - Cessation counseling provided.  - Start her on nicotine patch.  - UDS pending   Left rib fracture:  - Pain control and incentive spirometry.   Hypomagnesemia - replete, monitor  Low UOP     - add fluids     - check renal/bladder US     - 12/27/18: renal US pending; UOP picked up, continue fluids  Lethargy     - more lethargic this AM     - question infection vs hypoxia     - LFT ok; but mildly elevated ammonia; can consider a dose of lactulose     -  mentation is improving slightly with BiPap; monitor     - avoid sedating meds/narcotics     - 12/27/18: her mentation improved through yesterday afternoon with BiPap, I would place her on Bipap at least at night; let's repeat ammonia and check a CTH  DVT prophylaxis: lovenox Code Status: FULL   Disposition Plan:  TBD  Consultants:   Ortho  Antimicrobials:  . Rocephin, zithro (end 12/27)   Subjective: Following some commands this morning, but slow  Objective: Vitals:   12/27/18 0436 12/27/18 0539 12/27/18 0733 12/27/18 1123  BP: (!) 126/58  126/62 (!) 117/59  Pulse: 81  73 75  Resp: 14     Temp: 98 F (36.7 C)  97.6 F (36.4 C) 98 F (36.7 C)  TempSrc: Oral  Oral Oral  SpO2: 98%  98% 97%  Weight:  94.3 kg    Height:        Intake/Output Summary (Last 24 hours) at 12/27/2018 1350 Last data filed at 12/27/2018 0541 Gross per 24 hour  Intake 610 ml  Output 650 ml  Net -40 ml   Filed Weights   12/24/18 1237 12/25/18 1219 12/27/18 0539  Weight: 106.6 kg 106.6 kg 94.3 kg    Examination:  General: 53 y.o. female resting in bed in NAD Cardiovascular: RRR, +S1, S2, no m/g/r Respiratory: CTABL, no w/r/r, normal WOB GI: BS+, NDNT,soft MSK: No e/c/c Neuro: alert to name, slow to respond, but following some commands   Data Reviewed: I have personally reviewed following labs and imaging studies.  CBC: Recent Labs  Lab 12/24/18 1300 12/25/18 1106 12/25/18 1415 12/26/18 0403 12/27/18 0439  WBC 11.6* 9.7  --  11.9* 10.3  NEUTROABS 10.4* 8.1*  --  8.6*  --   HGB 12.8 10.7* 11.2* 9.2* 8.0*  HCT 43.2 35.3* 33.0* 30.3* 26.8*  MCV 81.1 81.0  --  80.2 81.7  PLT 296 238  --  257 [id number]   Basic Metabolic Panel: Recent Labs  Lab 12/24/18 1300 12/25/18 1106 12/25/18 1415 12/26/18 0403 12/27/18 0439  NA 140 138 138 137 142  K 4.2 3.7 3.8 3.7 4.0  CL 99 101  --  102 108  CO2 27 26  --  25 25  GLUCOSE 196* 90  --  139* 146*  BUN 9 13  --  12 16  CREATININE 1.33* 1.44*  --  1.21* 1.02*  CALCIUM 8.8* 8.0*  --  7.7* 7.5*  MG  --  1.2*  --  1.5*  --   PHOS  --  4.1  --  2.9  --    GFR: Estimated Creatinine Clearance: 78.4 mL/min (A) (by C-G formula based on SCr of 1.02 mg/dL (H)). Liver Function Tests: Recent Labs  Lab 12/24/18 1300 12/25/18 1106 12/26/18 0403  12/26/18 0827  AST 21  --   --  19  ALT 15  --   --  9  ALKPHOS 187*  --   --  124  BILITOT 1.0  --   --  0.2*  PROT 7.0  --   --  5.6*  ALBUMIN 2.4* 2.0* 1.8* 1.7*   No results for input(s): LIPASE, AMYLASE in the last 168 hours. Recent Labs  Lab 12/26/18 0827  AMMONIA 42*   Coagulation Profile: No results for input(s): INR, PROTIME in the last 168 hours. Cardiac Enzymes: No results for input(s): CKTOTAL, CKMB, CKMBINDEX, TROPONINI in the last 168 hours. BNP (last 3 results) No results for input(s): PROBNP in the last  8760 hours. HbA1C: Recent Labs    12/24/18 1830  HGBA1C 7.1*   CBG: Recent Labs  Lab 12/26/18 1228 12/26/18 1723 12/26/18 2139 12/27/18 0731 12/27/18 1122  GLUCAP 98 100* 126* 121* 68*   Lipid Profile: No results for input(s): CHOL, HDL, LDLCALC, TRIG, CHOLHDL, LDLDIRECT in the last 72 hours. Thyroid Function Tests: No results for input(s): TSH, T4TOTAL, FREET4, T3FREE, THYROIDAB in the last 72 hours. Anemia Panel: No results for input(s): VITAMINB12, FOLATE, FERRITIN, TIBC, IRON, RETICCTPCT in the last 72 hours. Sepsis Labs: Recent Labs  Lab 12/24/18 1300 12/24/18 1540 12/26/18 0526 12/26/18 0827  LATICACIDVEN 1.7 0.9 1.2 0.8    Recent Results (from the past 240 hour(s))  Blood culture (routine x 2)     Status: None (Preliminary result)   Collection Time: 12/24/18 12:39 PM   Specimen: BLOOD  Result Value Ref Range Status   Specimen Description BLOOD SITE NOT SPECIFIED  Final   Special Requests   Final    BOTTLES DRAWN AEROBIC AND ANAEROBIC Blood Culture adequate volume   Culture   Final    NO GROWTH 3 DAYS Performed at [facility], [address], [place], [place] [address]    Report Status PENDING  Incomplete  Blood culture (routine x 2)     Status: None (Preliminary result)   Collection Time: 12/24/18  1:37 PM   Specimen: BLOOD  Result Value Ref Range Status   Specimen Description BLOOD SITE NOT SPECIFIED  Final   Special  Requests   Final    BOTTLES DRAWN AEROBIC AND ANAEROBIC Blood Culture adequate volume   Culture   Final    NO GROWTH 3 DAYS Performed at [facility], [address], [place], [place] [address]    Report Status PENDING  Incomplete  SARS CORONAVIRUS 2 (TAT 6-24 HRS) Nasopharyngeal Nasopharyngeal Swab     Status: None   Collection Time: 12/24/18  2:21 PM   Specimen: Nasopharyngeal Swab  Result Value Ref Range Status   SARS Coronavirus 2 NEGATIVE NEGATIVE Final    Comment: (NOTE) SARS-CoV-2 target nucleic acids are NOT DETECTED. The SARS-CoV-2 RNA is generally detectable in upper and lower respiratory specimens during the acute phase of infection. Negative results do not preclude SARS-CoV-2 infection, do not rule out co-infections with other pathogens, and should not be used as the sole basis for treatment or other patient management decisions. Negative results must be combined with clinical observations, patient history, and epidemiological information. The expected result is Negative. Fact Sheet for Patients: SugarRoll.be Fact Sheet for Healthcare Providers: https://www.woods-mathews.com/ This test is not yet approved or cleared by the [place] FDA and  has been authorized for detection and/or diagnosis of SARS-CoV-2 by FDA under an Emergency Use Authorization (EUA). This EUA will remain  in effect (meaning this test can be used) for the duration of the COVID-19 declaration under Section 56 4(b)(1) of the Act, 21 U.S.C. section 360bbb-3(b)(1), unless the authorization is terminated or revoked sooner. Performed at [facility], [place] [address] [place], [place] [address]   Surgical pcr screen     Status: None   Collection Time: 12/24/18 10:13 PM   Specimen: Nasal Mucosa; Nasal Swab  Result Value Ref Range Status   MRSA, PCR NEGATIVE NEGATIVE Final   Staphylococcus aureus NEGATIVE NEGATIVE Final    Comment: (NOTE) The  Xpert SA Assay (FDA approved for NASAL specimens in patients 18 years of age and older), is one component of a  comprehensive surveillance program. It is not intended to diagnose infection nor to guide or monitor treatment. Performed at [facility], [place] [address] [place], [place] [address]   Culture, Urine     Status: None (Preliminary result)   Collection Time: 12/25/18 10:30 PM   Specimen: Urine, Catheterized  Result Value Ref Range Status   Specimen Description URINE, CATHETERIZED  Final   Special Requests NONE  Final   Culture   Final    CULTURE REINCUBATED FOR BETTER GROWTH Performed at [facility], [place] [address], [place], [place] [address]    Report Status PENDING  Incomplete      Radiology Studies: US RENAL  Result Date: 12/26/2018 CLINICAL DATA:  53 year old female with urinary retention. History of hypertension and diabetes. EXAM: RENAL / URINARY TRACT ULTRASOUND COMPLETE COMPARISON:  Renal ultrasound dated 08/20/2016. FINDINGS: Right Kidney: Renal measurements: 11.0 x 4.5 x 5.0 cm = volume: 130 mL. Mild parenchyma atrophy. Normal echogenicity. No hydronephrosis or shadowing stone. Left Kidney: Renal measurements: 9.8 x 4.9 x 4.9 cm = volume: 120 mL. Mild parenchyma atrophy. Normal echogenicity. No hydronephrosis or shadowing stone. Bladder: Appears normal for degree of bladder distention. Other: None. IMPRESSION: No hydronephrosis or shadowing stone. Electronically Signed   By: Anner Crete M.D.   On: 12/26/2018 21:05   DG Pelvis Portable  Result Date: 12/25/2018 CLINICAL DATA:  Post RIGHT femoral nail, RIGHT femur internally rotated and resistant to movement EXAM: PORTABLE PELVIS 1-2 VIEWS COMPARISON:  Portable exam 1515 hours compared to 12/24/2018 FINDINGS: RIGHT femoral nail has been placed with 2 screws across the intertrochanteric fracture into the RIGHT femoral head. Diffuse osseous demineralization. No dislocation. Visualized pelvis appears grossly  intact. Old healed deformity of the proximal LEFT femur with sclerosis of the LEFT femoral head corresponding to sclerosis and fragmentation of the superior LEFT femoral head and suspected underlying avascular necrosis. IMPRESSION: Post ORIF of intertrochanteric fracture RIGHT femur. Osseous demineralization with posttraumatic deformity and suspect underlying avascular necrosis at the LEFT femoral head. Electronically Signed   By: Lavonia Dana M.D.   On: 12/25/2018 16:16   DG CHEST PORT 1 VIEW  Result Date: 12/26/2018 CLINICAL DATA:  Hypoxia and fever, lethargic. EXAM: PORTABLE CHEST 1 VIEW COMPARISON:  12/24/2018, 06/02/2018 and 01/05/2018 FINDINGS: Lungs are hypoinflated with minimal opacification over the left retrocardiac region which may be due to vascular crowding, atelectasis or early infection. No evidence of effusion. Cardiomediastinal silhouette is within normal. There are multiple bilateral rib fractures. IMPRESSION: Hypoinflated lungs with mild opacification in the left retrocardiac region which may be due to early infection, vascular crowding or atelectasis. Electronically Signed   By: Marin Olp M.D.   On: 12/26/2018 05:57   DG C-Arm 1-60 Min  Result Date: 12/25/2018 CLINICAL DATA:  Internal fixation EXAM: RIGHT FEMUR 2 VIEWS; DG C-ARM 1-60 MIN COMPARISON:  12/24/2018 FINDINGS: Interval internal fixation across the right femoral intertrochanteric fracture. Near anatomic alignment. No hardware bony complicating feature. IMPRESSION: Internal fixation across right intertrochanteric fracture. No visible complicating feature. Electronically Signed   By: Rolm Baptise M.D.   On: 12/25/2018 14:46   DG FEMUR, MIN 2 VIEWS RIGHT  Result Date: 12/25/2018 CLINICAL DATA:  Internal fixation EXAM: RIGHT FEMUR 2 VIEWS; DG C-ARM 1-60 MIN COMPARISON:  12/24/2018 FINDINGS: Interval internal fixation across the right femoral intertrochanteric fracture. Near anatomic alignment. No hardware bony complicating  feature. IMPRESSION: Internal fixation across right intertrochanteric fracture. No visible complicating feature. Electronically Signed   By:  Rolm Baptise M.D.   On: 12/25/2018 14:46   DG FEMUR PORT, MIN 2 VIEWS RIGHT  Result Date: 12/25/2018 CLINICAL DATA:  Post RIGHT femoral nail EXAM: RIGHT FEMUR PORTABLE 2 VIEW COMPARISON:  Portable exam 1532 hours compared to RIGHT hip radiographs of 12/24/2018 FINDINGS: IM nail with distal locking screw seen within the RIGHT femur. Diffuse osseous demineralization. Joint space narrowing RIGHT knee. No fracture or dislocation identified at the mid to distal femur. Intertrochanteric fracture RIGHT femur imaged and reported separately. IMPRESSION: Post placement of IM nail with distal locking screw. Osseous demineralization without additional femoral abnormality. Electronically Signed   By: Lavonia Dana M.D.   On: 12/25/2018 16:18     Scheduled Meds: . bictegravir-emtricitabine-tenofovir AF  1 tablet Oral Daily  . docusate sodium  100 mg Oral BID  . enoxaparin (LOVENOX) injection  40 mg Subcutaneous Q24H  . feeding supplement (ENSURE ENLIVE)  237 mL Oral QHS  . fluticasone  1 spray Each Nare Daily  . fluticasone furoate-vilanterol  1 puff Inhalation Daily   And  . umeclidinium bromide  1 puff Inhalation Daily  . insulin aspart  0-5 Units Subcutaneous QHS  . insulin aspart  0-9 Units Subcutaneous TID WC  . insulin glargine  40 Units Subcutaneous BID  . multivitamin with minerals  1 tablet Oral Daily  . pantoprazole  40 mg Oral Daily  . pregabalin  225 mg Oral BID  . rosuvastatin  20 mg Oral Daily  . sertraline  150 mg Oral Daily   Continuous Infusions: . sodium chloride 125 mL/hr at 12/27/18 0651  . azithromycin 500 mg (12/27/18 0532)  . cefTRIAXone (ROCEPHIN)  IV 1 g (12/27/18 0807)  . methocarbamol (ROBAXIN) IV    . sodium chloride       LOS: 3 days    Time spent: 25 minutes spent in the coordination of care today.    Jonnie Finner,  DO [facility]  If 7PM-7AM, please contact night-coverage [email or web address] 12/27/2018, 1:50 PM

## 2018-12-27 NOTE — Progress Notes (Signed)
Pts blood sugar 64, A&O, VSS, able to drink strawberry ensure.

## 2018-12-27 NOTE — Evaluation (Signed)
Physical Therapy Evaluation Patient Details Name: [name] MRN: [id number] DOB: Jun 06, 1965 Today's Date: 12/27/2018   History of Present Illness  [name] is a 53 y.o. female with medical history significant for but not limited to of asthma, COPD, stage IV CKD, morbid obesity, depression, anxiety, hypertension, insulin-dependent diabetes mellitus, left-sided BKA, diabetic neuropathy, GERD, HIV, hyperlipidemia who fell at home from the commode. Found to have Intertrochanteric fracture of the proximal right femur and now s/p IM nail. Post-op was lethargic and placed on Bi-pap and moved to a progressive care unit.   Clinical Impression  Patient is s/p above surgery resulting in functional limitations due to the deficits listed below (see PT Problem List).  Patient will benefit from skilled PT to increase their independence and safety with mobility. A very limited eval was able to be completed due to pt being lethargic and significant pain with any attempts to mobilize or more her RLE.  It is very likely that she will require a SNF stay unless she is able to get 24 significant care at home.  PT will continue to follow and attempt to progress mobility as the pt tolerates.     Follow Up Recommendations Supervision for mobility/OOB;Supervision/Assistance - 24 hour;SNF    Equipment Recommendations  None recommended by PT    Recommendations for Other Services OT consult     Precautions / Restrictions Precautions Precautions: Fall Required Braces or Orthoses: Other Brace(has prosthetic for LLE) Restrictions Weight Bearing Restrictions: Yes RLE Weight Bearing: Weight bearing as tolerated      Mobility  Bed Mobility Overal bed mobility: Needs Assistance Bed Mobility: Supine to Sit     Supine to sit: Total assist     General bed mobility comments: unable to et sitting EOB with just me assisting. Pt onlt providing a small amount of assistance likely due to pain and  lethargy  Transfers                 General transfer comment: unable to attempt  Ambulation/Gait             General Gait Details: unable to attempt  Stairs            Wheelchair Mobility    Modified Rankin (Stroke Patients Only)       Balance                                             Pertinent Vitals/Pain Pain Assessment: 0-10 Pain Score: 10-Worst pain ever Pain Location: buttocks on both sides Pain Intervention(s): Limited activity within patient's tolerance;Patient requesting pain meds-RN notified    Home Living Family/patient expects to be discharged to:: Unsure                 Additional Comments: unable to obtain prior living function    Prior Function Level of Independence: Needs assistance         Comments: per chart pt used WC as primary mode of mobility.      Hand Dominance        Extremity/Trunk Assessment   Upper Extremity Assessment Upper Extremity Assessment: Defer to OT evaluation    Lower Extremity Assessment Lower Extremity Assessment: RLE deficits/detail RLE Deficits / Details: Rom both active and passive very limited due to pain RLE: Unable to fully assess due to pain  Communication   Communication: Other (comment)(pt lethargic and was not answering all questions)  Cognition Arousal/Alertness: Lethargic Behavior During Therapy: Flat affect Overall Cognitive Status: Impaired/Different from baseline Area of Impairment: Orientation;Following commands;Awareness                 Orientation Level: Disoriented to;Situation     Following Commands: Follows one step commands inconsistently              General Comments General comments (skin integrity, edema, etc.): no family present. Very limited eval due to pain, lethargy, and cognitive status    Exercises     Assessment/Plan    PT Assessment Patient needs continued PT services  PT Problem List Decreased  strength;Decreased range of motion;Decreased activity tolerance;Decreased balance;Decreased mobility;Decreased cognition;Decreased knowledge of use of DME;Obesity;Pain       PT Treatment Interventions DME instruction;Gait training;Functional mobility training;Therapeutic activities;Therapeutic exercise;Patient/family education    PT Goals (Current goals can be found in the Care Plan section)  Acute Rehab PT Goals Patient Stated Goal: pt unable to participate in goal setting PT Goal Formulation: Patient unable to participate in goal setting Time For Goal Achievement: 01/03/19 Potential to Achieve Goals: Good    Frequency Min 3X/week   Barriers to discharge        Co-evaluation               AM-PAC PT "6 Clicks" Mobility  Outcome Measure Help needed turning from your back to your side while in a flat bed without using bedrails?: Total Help needed moving from lying on your back to sitting on the side of a flat bed without using bedrails?: Total Help needed moving to and from a bed to a chair (including a wheelchair)?: Total Help needed standing up from a chair using your arms (e.g., wheelchair or bedside chair)?: Total Help needed to walk in hospital room?: Total Help needed climbing 3-5 steps with a railing? : Total 6 Click Score: 6    End of Session Equipment Utilized During Treatment: Oxygen Activity Tolerance: Patient limited by lethargy;Patient limited by pain Patient left: in bed;with call bell/phone within reach;with bed alarm set   PT Visit Diagnosis: Unsteadiness on feet (R26.81)    Time: [id number] PT Time Calculation (min) (ACUTE ONLY): 19 min   Charges:   PT Evaluation $PT Eval Moderate Complexity: 1 Mod          Ophelia Shoulder, PT   Acute Rehabilitation Services  Pager [phone] Office [phone] 12/27/2018   Melvern Banker 12/27/2018, 12:12 PM

## 2018-12-27 NOTE — Progress Notes (Signed)
RT obtained ABG on pt with the following results. RT to increase pts oxygen at this time. RT will continue to monitor.   Results for KHRIS, REIL (MRN [id number]) as of 12/27/2018 00:44  Ref. Range 12/27/2018 00:28  Sample type Unknown ARTERIAL  FIO2 Unknown 32.00  pH, Arterial Latest Ref Range: 7.350 - 7.450  7.337 (L)  pCO2 arterial Latest Ref Range: 32.0 - 48.0 mmHg 52.7 (H)  pO2, Arterial Latest Ref Range: 83.0 - 108.0 mmHg 67.0 (L)  Acid-Base Excess Latest Ref Range: 0.0 - 2.0 mmol/L 2.4 (H)  Bicarbonate Latest Ref Range: 20.0 - 28.0 mmol/L 27.8  O2 Saturation Latest Units: % 93.2  Patient temperature Unknown 36.2  Collection site Unknown RIGHT RADIAL  Allens test (pass/fail) Latest Ref Range: PASS  PASS

## 2018-12-27 NOTE — Progress Notes (Signed)
Hypoglycemic Event  CBG: 65  Treatment: 8 oz juice/soda  Symptoms: None  Follow-up CBG: Time:2235 CBG Result:109  Possible Reasons for Event: Inadequate meal intake  Comments/MD notified:per protocol    Ronal Maybury L

## 2018-12-28 LAB — RENAL FUNCTION PANEL
Albumin: 1.5 g/dL — ABNORMAL LOW (ref 3.5–5.0)
Anion gap: 6 (ref 5–15)
BUN: 12 mg/dL (ref 6–20)
CO2: 28 mmol/L (ref 22–32)
Calcium: 7.8 mg/dL — ABNORMAL LOW (ref 8.9–10.3)
Chloride: 107 mmol/L (ref 98–111)
Creatinine, Ser: 0.74 mg/dL (ref 0.44–1.00)
GFR calc Af Amer: >60 mL/min (ref >60)
GFR calc non Af Amer: >60 mL/min (ref >60)
Glucose, Bld: 101 mg/dL — ABNORMAL HIGH (ref 70–99)
Phosphorus: 2.1 mg/dL — ABNORMAL LOW (ref 2.5–4.6)
Potassium: 3.6 mmol/L (ref 3.5–5.1)
Sodium: 141 mmol/L (ref 135–145)

## 2018-12-28 LAB — CBC WITH DIFFERENTIAL/PLATELET
Abs Immature Granulocytes: 0.04 10*3/uL (ref 0.00–0.07)
Basophils Absolute: 0.1 10*3/uL (ref 0.0–0.1)
Basophils Relative: 1 %
Eosinophils Absolute: 0.9 10*3/uL — ABNORMAL HIGH (ref 0.0–0.5)
Eosinophils Relative: 9 %
HCT: 25.3 % — ABNORMAL LOW (ref 36.0–46.0)
Hemoglobin: 7.3 g/dL — ABNORMAL LOW (ref 12.0–15.0)
Immature Granulocytes: 0 %
Lymphocytes Relative: 16 %
Lymphs Abs: 1.6 10*3/uL (ref 0.7–4.0)
MCH: 24.3 pg — ABNORMAL LOW (ref 26.0–34.0)
MCHC: 28.9 g/dL — ABNORMAL LOW (ref 30.0–36.0)
MCV: 84.1 fL (ref 80.0–100.0)
Monocytes Absolute: 0.8 10*3/uL (ref 0.1–1.0)
Monocytes Relative: 9 %
Neutro Abs: 6.4 10*3/uL (ref 1.7–7.7)
Neutrophils Relative %: 65 %
Platelets: 248 10*3/uL (ref 150–400)
RBC: 3.01 MIL/uL — ABNORMAL LOW (ref 3.87–5.11)
RDW: 22.6 % — ABNORMAL HIGH (ref 11.5–15.5)
WBC: 9.8 10*3/uL (ref 4.0–10.5)
nRBC: 0 % (ref 0.0–0.2)

## 2018-12-28 LAB — GLUCOSE, CAPILLARY
Glucose-Capillary: 118 mg/dL — ABNORMAL HIGH (ref 70–99)
Glucose-Capillary: 61 mg/dL — ABNORMAL LOW (ref 70–99)
Glucose-Capillary: 58 mg/dL — ABNORMAL LOW (ref 70–99)
Glucose-Capillary: 80 mg/dL (ref 70–99)
Glucose-Capillary: 101 mg/dL — ABNORMAL HIGH (ref 70–99)

## 2018-12-28 LAB — MAGNESIUM: Magnesium: 1.6 mg/dL — ABNORMAL LOW (ref 1.7–2.4)

## 2018-12-28 LAB — AMMONIA: Ammonia: 37 umol/L — ABNORMAL HIGH (ref 9–35)

## 2018-12-28 MED ORDER — POTASSIUM PHOSPHATE MONOBASIC 500 MG PO TABS
500.0000 mg | ORAL_TABLET | Freq: Three times a day (TID) | ORAL | Status: DC
  Filled 2018-12-28 (×4): qty 1

## 2018-12-28 MED ORDER — INSULIN GLARGINE 100 UNIT/ML ~~LOC~~ SOLN
20.0000 [IU] | Freq: Two times a day (BID) | SUBCUTANEOUS | Status: DC
  Administered 2018-12-28 – 2018-12-30 (×4): 20 [IU] via SUBCUTANEOUS
  Filled 2018-12-28 (×7): qty 0.2

## 2018-12-28 MED ORDER — AMOXICILLIN-POT CLAVULANATE 875-125 MG PO TABS
1.0000 | ORAL_TABLET | Freq: Two times a day (BID) | ORAL | Status: AC
  Administered 2018-12-29 – 2018-12-30 (×4): 1 via ORAL
  Filled 2018-12-28 (×5): qty 1

## 2018-12-28 MED ORDER — SODIUM PHOSPHATES 45 MMOLE/15ML IV SOLN: 10.0000 mmol | Freq: Once | INTRAVENOUS | Status: DC

## 2018-12-28 MED ORDER — MAGNESIUM SULFATE 2 GM/50ML IV SOLN
2.0000 g | Freq: Once | INTRAVENOUS | Status: AC
  Administered 2018-12-28: 16:00:00 2 g via INTRAVENOUS
  Filled 2018-12-28: qty 50

## 2018-12-28 MED ORDER — LOPERAMIDE HCL 2 MG PO CAPS
4.0000 mg | ORAL_CAPSULE | ORAL | Status: DC | PRN
  Administered 2018-12-29 – 2019-01-04 (×9): 4 mg via ORAL
  Filled 2018-12-28 (×9): qty 2

## 2018-12-28 NOTE — Progress Notes (Signed)
Pt placed on dream station in BIPAP mode for the night. Pt settings are 12/6 w/5 Lpm bled into the system. Pt respiratory status is stable at this time on dream station. RT will continue to monitor.

## 2018-12-28 NOTE — Progress Notes (Signed)
[name]  PROGRESS NOTE    [name]  [id number] DOB: [birth year] DOA: 12/24/2018 PCP: [name], PA-C   Brief Narrative:   Erin Boyte Pauldingis a 53 y.o.femalewith medical history significant ofasthma, COPD, stage IV CKD, morbid obesity, depression, anxiety, hypertension, insulin-dependent diabetes mellitus, left-sided DKA, diabetic neuropathy, GERD, HIV, hyperlipidemia was at home today and was trying to get up from the commode reports feeling dizzy and fell on her right side. She reports pain on the right hip. On arrival to ED she underwent x-rays of the right hip and was found to have a Possible left pelvic bone fractures. Probable intertrochanteric fracture of the proximal right femur. As per the patient, she denied hitting her head or syncope. She reports she was trying to get up from the commode after BM, felt dizzy and fell on the right side. She denies any chest pain or sob, fever, chills, nausea, vomiting , abdominal pain, headache, tingling or numbness, diarrhea, dysuria, hematochezia, or hematemesis. She denies any blurry vision at this time.  Orthopedics consulted and she was referred to medical service for admission.  12/28/18: More alert and interactive this AM. CTH negative. She is doing better. Advance diet. Replete lytes   Assessment & Plan:   Principal Problem:   Displaced intertrochanteric fracture of right femur, initial encounter for closed fracture ([place]) Active Problems:   Pressure injury of skin  Intertrochanteric fracture of the proximal right femur: - ortho onboard -s/pOpen treatment of intertrochanteric fracture with intramedullary implant - Pain control; watch narcotics and sedating meds d/t lethergy  Dizziness and fall Debility - Possibly secondary to hypotension. - echo: Left Ventricle: Left ventricular ejection fraction, by visual estimation, is 60 to 65%. The left ventricle has normal function. The left ventricle  has no regional wall motion abnormalities. The left ventricular internal cavity size was the left  ventricle is normal in size. There is mildly increased left ventricular hypertrophy. Left ventricular diastolic parameters were normal.Right Ventricle: The right ventricular size is mildly enlarged. No increase in right ventricular wall thickness. Global RV systolic function is has normal systolic function. - negative trp     - PT/OT rec HHPT/OT  Hypotension  - Probably dehydration.  - Hold home BP meds. - BP ok; can d/c fluids  IDDM complicated by diabetic neuropathy - SSI; monitor - A1c 7.1     - diabetic diet  Hyperlipidemia  - crestor  CKD IIIa - Baseline creatinine ranging between 1 to 1.4. currently its at 1.02  Pyuria -UCx with yeast - stop rocephin     - typically do not treat candiuria; monitor for now  HIV  - biktarvy  COPD and Asthma: - flonase, breo ellipta - PRN albuterol, duonebs     - CXR with possible early infection - d/c rocephin, zithro; transition to augmentin ( end 12/29) - Bipap d/t lethergy, hypoxia  Tobacco abuse:  - Cessation counseling provided.  - Start her on nicotine patch.  - UDS pending   Left rib fracture:  - Pain control and incentive spirometry.   Hypomagnesemia Hypophosphatemia - replete, monitor  Low UOP - add fluids - check renal/bladder US     - 12/27/18: renal US pending; UOP picked up, continue fluids     - 12/28/18: renal US resulted noted, UOP ok; can stop fluids, encourage PO intake  Lethargy - more lethargic this AM - question infection vs hypoxia - LFT ok; but mildly elevated ammonia; can consider a dose of lactulose - mentation is improving  slightly with BiPap; monitor - avoid sedating meds/narcotics     - 12/27/18: her mentation improved through yesterday afternoon with BiPap, I would place  her on Bipap at least at night; let's repeat ammonia and check a CTH     - 12/28/18: CTH results noted, she is much more active and alert today  DVT prophylaxis: lovenox Code Status: FULL   Disposition Plan: TBD  Consultants:   Ortho  Antimicrobials:  . Rocephin, zithro   Subjective: Reports leg pain  Objective: Vitals:   12/28/18 0400 12/28/18 0451 12/28/18 0758 12/28/18 0844  BP: (!) 103/48 (!) 107/49 (!) 111/57   Pulse: 78 77 84   Resp: 18  20 15   Temp: 97.6 F (36.4 C)  99.5 F (37.5 C)   TempSrc: Axillary  Oral   SpO2: 100% 100% 96% 95%  Weight: 99.3 kg     Height:        Intake/Output Summary (Last 24 hours) at 12/28/2018 1437 Last data filed at 12/28/2018 0850 Gross per 24 hour  Intake 100 ml  Output 550 ml  Net -450 ml   Filed Weights   12/25/18 1219 12/27/18 0539 12/28/18 0400  Weight: 106.6 kg 94.3 kg 99.3 kg    Examination:  General: 53 y.o. female resting in bed in NAD Cardiovascular: RRR, +S1, S2, no m/g/r, equal pulses throughout Respiratory: CTABL, no w/r/r, normal WOB GI: BS+, NDNT, no masses noted MSK: No e/c/c Neuro: alert to name, follows commands Psyc: calm/cooperative   Data Reviewed: I have personally reviewed following labs and imaging studies.  CBC: Recent Labs  Lab 12/24/18 1300 12/25/18 1106 12/25/18 1415 12/26/18 0403 12/27/18 0439 12/28/18 0319  WBC 11.6* 9.7  --  11.9* 10.3 9.8  NEUTROABS 10.4* 8.1*  --  8.6*  --  6.4  HGB 12.8 10.7* 11.2* 9.2* 8.0* 7.3*  HCT 43.2 35.3* 33.0* 30.3* 26.8* 25.3*  MCV 81.1 81.0  --  80.2 81.7 84.1  PLT 296 238  --  257 202 [id number]   Basic Metabolic Panel: Recent Labs  Lab 12/24/18 1300 12/25/18 1106 12/25/18 1415 12/26/18 0403 12/27/18 0439 12/28/18 0319  NA 140 138 138 137 142 141  K 4.2 3.7 3.8 3.7 4.0 3.6  CL 99 101  --  102 108 107  CO2 27 26  --  25 25 28   GLUCOSE 196* 90  --  139* 146* 101*  BUN 9 13  --  12 16 12   CREATININE 1.33* 1.44*  --  1.21* 1.02* 0.74  CALCIUM  8.8* 8.0*  --  7.7* 7.5* 7.8*  MG  --  1.2*  --  1.5*  --  1.6*  PHOS  --  4.1  --  2.9  --  2.1*   GFR: Estimated Creatinine Clearance: 102.5 mL/min (by C-G formula based on SCr of 0.74 mg/dL). Liver Function Tests: Recent Labs  Lab 12/24/18 1300 12/25/18 1106 12/26/18 0403 12/26/18 0827 12/28/18 0319  AST 21  --   --  19  --   ALT 15  --   --  9  --   ALKPHOS 187*  --   --  124  --   BILITOT 1.0  --   --  0.2*  --   PROT 7.0  --   --  5.6*  --   ALBUMIN 2.4* 2.0* 1.8* 1.7* 1.5*   No results for input(s): LIPASE, AMYLASE in the last 168 hours. Recent Labs  Lab 12/26/18 0827 12/28/18 0319  AMMONIA  42* 37*   Coagulation Profile: No results for input(s): INR, PROTIME in the last 168 hours. Cardiac Enzymes: No results for input(s): CKTOTAL, CKMB, CKMBINDEX, TROPONINI in the last 168 hours. BNP (last 3 results) No results for input(s): PROBNP in the last 8760 hours. HbA1C: No results for input(s): HGBA1C in the last 72 hours. CBG: Recent Labs  Lab 12/27/18 1628 12/27/18 2057 12/27/18 2235 12/28/18 0800 12/28/18 1248  GLUCAP 108* 65* 109* 118* 61*   Lipid Profile: No results for input(s): CHOL, HDL, LDLCALC, TRIG, CHOLHDL, LDLDIRECT in the last 72 hours. Thyroid Function Tests: No results for input(s): TSH, T4TOTAL, FREET4, T3FREE, THYROIDAB in the last 72 hours. Anemia Panel: No results for input(s): VITAMINB12, FOLATE, FERRITIN, TIBC, IRON, RETICCTPCT in the last 72 hours. Sepsis Labs: Recent Labs  Lab 12/24/18 1300 12/24/18 1540 12/26/18 0526 12/26/18 0827  LATICACIDVEN 1.7 0.9 1.2 0.8    Recent Results (from the past 240 hour(s))  Blood culture (routine x 2)     Status: None (Preliminary result)   Collection Time: 12/24/18 12:39 PM   Specimen: BLOOD  Result Value Ref Range Status   Specimen Description BLOOD SITE NOT SPECIFIED  Final   Special Requests   Final    BOTTLES DRAWN AEROBIC AND ANAEROBIC Blood Culture adequate volume   Culture   Final     NO GROWTH 4 DAYS Performed at [facility], [address], [place], [place] [address]    Report Status PENDING  Incomplete  Blood culture (routine x 2)     Status: None (Preliminary result)   Collection Time: 12/24/18  1:37 PM   Specimen: BLOOD  Result Value Ref Range Status   Specimen Description BLOOD SITE NOT SPECIFIED  Final   Special Requests   Final    BOTTLES DRAWN AEROBIC AND ANAEROBIC Blood Culture adequate volume   Culture   Final    NO GROWTH 4 DAYS Performed at [facility], [address], [place], [place] [address]    Report Status PENDING  Incomplete  SARS CORONAVIRUS 2 (TAT 6-24 HRS) Nasopharyngeal Nasopharyngeal Swab     Status: None   Collection Time: 12/24/18  2:21 PM   Specimen: Nasopharyngeal Swab  Result Value Ref Range Status   SARS Coronavirus 2 NEGATIVE NEGATIVE Final    Comment: (NOTE) SARS-CoV-2 target nucleic acids are NOT DETECTED. The SARS-CoV-2 RNA is generally detectable in upper and lower respiratory specimens during the acute phase of infection. Negative results do not preclude SARS-CoV-2 infection, do not rule out co-infections with other pathogens, and should not be used as the sole basis for treatment or other patient management decisions. Negative results must be combined with clinical observations, patient history, and epidemiological information. The expected result is Negative. Fact Sheet for Patients: SugarRoll.be Fact Sheet for Healthcare Providers: https://www.woods-mathews.com/ This test is not yet approved or cleared by the [place] FDA and  has been authorized for detection and/or diagnosis of SARS-CoV-2 by FDA under an Emergency Use Authorization (EUA). This EUA will remain  in effect (meaning this test can be used) for the duration of the COVID-19 declaration under Section 56 4(b)(1) of the Act, 21 U.S.C. section 360bbb-3(b)(1), unless the authorization is terminated  or revoked sooner. Performed at [facility], [place] [address] [place], [place] [address]   Surgical pcr screen     Status: None   Collection Time: 12/24/18 10:13 PM   Specimen: Nasal Mucosa; Nasal Swab  Result Value Ref Range  Status   MRSA, PCR NEGATIVE NEGATIVE Final   Staphylococcus aureus NEGATIVE NEGATIVE Final    Comment: (NOTE) The Xpert SA Assay (FDA approved for NASAL specimens in patients 35 years of age and older), is one component of a comprehensive surveillance program. It is not intended to diagnose infection nor to guide or monitor treatment. Performed at [facility], [place] [address] [place], [place] [address]   Culture, Urine     Status: Abnormal   Collection Time: 12/25/18 10:30 PM   Specimen: Urine, Catheterized  Result Value Ref Range Status   Specimen Description URINE, CATHETERIZED  Final   Special Requests   Final    NONE Performed at [facility], [place] [address] [place], [place] [address]    Culture >=100,000 COLONIES/mL YEAST (A)  Final   Report Status 12/27/2018 FINAL  Final      Radiology Studies: CT HEAD WO CONTRAST  Result Date: 12/27/2018 CLINICAL DATA:  Altered mental status. Recent fall. EXAM: CT HEAD WITHOUT CONTRAST TECHNIQUE: Contiguous axial images were obtained from the base of the skull through the vertex without intravenous contrast. COMPARISON:  CT head without contrast 06/02/2018 FINDINGS: Brain: Atrophy and white matter changes are stable. No acute infarct, hemorrhage, or mass lesion is present. The ventricles are proportionate to the degree of atrophy. No significant extraaxial fluid collection is present. Vascular: Atherosclerotic changes are present within the cavernous internal carotid arteries bilaterally. There is no hyperdense vessel. Skull: Calvarium is intact. No focal lytic or blastic lesions are present. Left supraorbital soft tissue swelling is present without underlying fracture. No radiopaque foreign body  is present. Sinuses/Orbits: The paranasal sinuses and mastoid air cells are clear. The globes and orbits are within normal limits. IMPRESSION: 1. Stable atrophy and white matter disease. 2. No acute intracranial abnormality or significant interval change. 3. Left supraorbital soft tissue swelling without underlying fracture. Electronically Signed   By: San Morelle M.D.   On: 12/27/2018 18:08   US RENAL  Result Date: 12/26/2018 CLINICAL DATA:  53 year old female with urinary retention. History of hypertension and diabetes. EXAM: RENAL / URINARY TRACT ULTRASOUND COMPLETE COMPARISON:  Renal ultrasound dated 08/20/2016. FINDINGS: Right Kidney: Renal measurements: 11.0 x 4.5 x 5.0 cm = volume: 130 mL. Mild parenchyma atrophy. Normal echogenicity. No hydronephrosis or shadowing stone. Left Kidney: Renal measurements: 9.8 x 4.9 x 4.9 cm = volume: 120 mL. Mild parenchyma atrophy. Normal echogenicity. No hydronephrosis or shadowing stone. Bladder: Appears normal for degree of bladder distention. Other: None. IMPRESSION: No hydronephrosis or shadowing stone. Electronically Signed   By: Anner Crete M.D.   On: 12/26/2018 21:05     Scheduled Meds: . bictegravir-emtricitabine-tenofovir AF  1 tablet Oral Daily  . docusate sodium  100 mg Oral BID  . enoxaparin (LOVENOX) injection  40 mg Subcutaneous Q24H  . feeding supplement (ENSURE ENLIVE)  237 mL Oral QHS  . fluticasone  1 spray Each Nare Daily  . fluticasone furoate-vilanterol  1 puff Inhalation Daily   And  . umeclidinium bromide  1 puff Inhalation Daily  . insulin aspart  0-5 Units Subcutaneous QHS  . insulin aspart  0-9 Units Subcutaneous TID WC  . insulin glargine  40 Units Subcutaneous BID  . multivitamin with minerals  1 tablet Oral Daily  . pantoprazole  40 mg Oral Daily  . potassium phosphate (monobasic)  500 mg Oral TID WC & HS  . pregabalin  225 mg Oral BID  . rosuvastatin  20 mg  Oral Daily  . sertraline  150 mg Oral Daily    Continuous Infusions: . sodium chloride 125 mL/hr at 12/27/18 2158  . azithromycin 500 mg (12/28/18 0543)  . cefTRIAXone (ROCEPHIN)  IV 1 g (12/28/18 0850)  . magnesium sulfate bolus IVPB    . methocarbamol (ROBAXIN) IV    . sodium chloride       LOS: 4 days    Time spent: 25 minutes spent in the coordination of care today.    Jonnie Finner, DO [facility]  If 7PM-7AM, please contact night-coverage [email or web address] 12/28/2018, 2:37 PM

## 2018-12-28 NOTE — Progress Notes (Signed)
RT placed pt on BIPAP dream station w/settings of 12/6 and 5 Lpm bled into the system. Pt respiratory status is stable at this time. RT will continue to monitor.

## 2018-12-28 NOTE — Evaluation (Signed)
Occupational Therapy Evaluation Patient Details Name: [name] MRN: [id number] DOB: 1965-04-12 Today's Date: 12/28/2018    History of Present Illness [name] is a 53 y.o. female with medical history significant for but not limited to asthma, COPD, stage IV CKD, morbid obesity, depression, anxiety, hypertension, insulin-dependent diabetes mellitus, left-sided BKA, diabetic neuropathy, GERD, HIV, hyperlipidemia who fell from the commode at home.  Found to have Intertrochanteric fracture of the proximal right femur and now s/p IM nail. Post-op was lethargic and placed on Bi-pap and moved to a progressive care unit.    Clinical Impression   Pt was using a manual w/c as her primary means of mobility and transferring with her prosthesis. Pt was performing sponge bathing at the sink and assisted for some aspects of her bathing and dressing. She was dependent in all IADL. Pt presents with generalized pain and weakness. She needed mod to max assist for bed mobility and tolerated sitting EOB x 5 minutes before complaining of back pain and fatigue and needing to return to supine. Recommending SNF for further rehab. Will follow acutely.    Follow Up Recommendations  SNF;Supervision/Assistance - 24 hour    Equipment Recommendations  Other (comment)(defer to next venue)    Recommendations for Other Services       Precautions / Restrictions Precautions Precautions: Fall Required Braces or Orthoses: Other Brace Other Brace: L LE prosthesis Restrictions Weight Bearing Restrictions: No RLE Weight Bearing: Weight bearing as tolerated      Mobility Bed Mobility Overal bed mobility: Needs Assistance Bed Mobility: Supine to Sit;Sit to Supine     Supine to sit: Max assist Sit to supine: Mod assist   General bed mobility comments: increased time and effort, assist for R LE over EOB and to raise trunk and position hips at EOB with bed pad, assist for R LE back into  bed  Transfers                 General transfer comment: deferred    Balance Overall balance assessment: Needs assistance   Sitting balance-Leahy Scale: Fair                                     ADL either performed or assessed with clinical judgement   ADL Overall ADL's : Needs assistance/impaired Eating/Feeding: Set up;Bed level   Grooming: Wash/dry hands;Wash/dry face;Sitting;Set up;Supervision/safety   Upper Body Bathing: Moderate assistance;Sitting   Lower Body Bathing: Total assistance;Sitting/lateral leans   Upper Body Dressing : Minimal assistance;Sitting   Lower Body Dressing: Total assistance;Sitting/lateral leans                 General ADL Comments: pt declined OOB     Vision Patient Visual Report: No change from baseline       Perception     Praxis      Pertinent Vitals/Pain Pain Assessment: Faces Faces Pain Scale: Hurts even more Pain Location: all over Pain Descriptors / Indicators: Aching Pain Intervention(s): Monitored during session;Premedicated before session;Repositioned     Hand Dominance Right   Extremity/Trunk Assessment Upper Extremity Assessment Upper Extremity Assessment: Generalized weakness   Lower Extremity Assessment Lower Extremity Assessment: Defer to PT evaluation   Cervical / Trunk Assessment Cervical / Trunk Assessment: Other exceptions Cervical / Trunk Exceptions: chronic back pain, obesity   Communication Communication Communication: No difficulties   Cognition Arousal/Alertness: Awake/alert Behavior During Therapy: Flat affect  Overall Cognitive Status: No family/caregiver present to determine baseline cognitive functioning Area of Impairment: Following commands;Problem solving                       Following Commands: Follows one step commands with increased time     Problem Solving: Slow processing;Decreased initiation     General Comments       Exercises      Shoulder Instructions      Home Living Family/patient expects to be discharged to:: Skilled nursing facility Living Arrangements: Children                               Additional Comments: pt owns a walker, manual w/c and AE for LB bathing and dressing      Prior Functioning/Environment Level of Independence: Needs assistance  Gait / Transfers Assistance Needed: transferred with prosthesis ADL's / Homemaking Assistance Needed: sponge bathed seated at sink, assisted for bathing and dressing and all IADL   Comments: was using w/c has primary means of mobility        OT Problem List: Decreased strength;Decreased activity tolerance;Impaired balance (sitting and/or standing);Decreased knowledge of use of DME or AE;Decreased cognition;Pain      OT Treatment/Interventions: Self-care/ADL training;DME and/or AE instruction;Patient/family education;Balance training;Therapeutic activities;Cognitive remediation/compensation    OT Goals(Current goals can be found in the care plan section) Acute Rehab OT Goals Patient Stated Goal: to be able to transfer with prosthesis OT Goal Formulation: With patient Time For Goal Achievement: 01/11/19 Potential to Achieve Goals: Good ADL Goals Pt Will Perform Lower Body Bathing: with min assist;sitting/lateral leans;with adaptive equipment Pt Will Perform Lower Body Dressing: with min assist;with adaptive equipment;sitting/lateral leans Pt Will Transfer to Toilet: with +2 assist;with mod assist;squat pivot transfer;bedside commode Additional ADL Goal #1: Pt will perform bed mobility with min assist in preparation for ADL. Additional ADL Goal #2: Pt will tolerate sitting EOB x 10 minutes with supervision in preparation for ADL.  OT Frequency: Min 2X/week   Barriers to D/C:            Co-evaluation              AM-PAC OT "6 Clicks" Daily Activity     Outcome Measure Help from another person eating meals?: None Help from another  person taking care of personal grooming?: A Little Help from another person toileting, which includes using toliet, bedpan, or urinal?: Total Help from another person bathing (including washing, rinsing, drying)?: A Lot Help from another person to put on and taking off regular upper body clothing?: A Little Help from another person to put on and taking off regular lower body clothing?: Total 6 Click Score: 14   End of Session    Activity Tolerance: Patient limited by fatigue Patient left: in bed;with call bell/phone within reach  OT Visit Diagnosis: Pain;History of falling (Z91.81);Muscle weakness (generalized) (M62.81);Other symptoms and signs involving cognitive function                Time: 1130-1144 OT Time Calculation (min): 14 min Charges:  OT General Charges $OT Visit: 1 Visit OT Evaluation $OT Eval Moderate Complexity: 1 Mod  Nestor Lewandowsky, OTR/L Acute Rehabilitation Services Pager: [phone] Office: [phone]  Malka So 12/28/2018, 2:09 PM

## 2018-12-29 ENCOUNTER — Encounter: Payer: Self-pay | Admitting: *Deleted

## 2018-12-29 LAB — RENAL FUNCTION PANEL
Albumin: 1.4 g/dL — ABNORMAL LOW (ref 3.5–5.0)
Anion gap: 8 (ref 5–15)
BUN: 10 mg/dL (ref 6–20)
CO2: 28 mmol/L (ref 22–32)
Calcium: 7.7 mg/dL — ABNORMAL LOW (ref 8.9–10.3)
Chloride: 104 mmol/L (ref 98–111)
Creatinine, Ser: 0.73 mg/dL (ref 0.44–1.00)
GFR calc Af Amer: >60 mL/min
GFR calc non Af Amer: >60 mL/min
Glucose, Bld: 66 mg/dL — ABNORMAL LOW (ref 70–99)
Phosphorus: 1.7 mg/dL — ABNORMAL LOW (ref 2.5–4.6)
Potassium: 3.4 mmol/L — ABNORMAL LOW (ref 3.5–5.1)
Sodium: 140 mmol/L (ref 135–145)

## 2018-12-29 LAB — CBC WITH DIFFERENTIAL/PLATELET
Abs Immature Granulocytes: 0.05 10*3/uL (ref 0.00–0.07)
Basophils Absolute: 0.1 10*3/uL (ref 0.0–0.1)
Basophils Relative: 1 %
Eosinophils Absolute: 0.7 10*3/uL — ABNORMAL HIGH (ref 0.0–0.5)
Eosinophils Relative: 8 %
HCT: 23.6 % — ABNORMAL LOW (ref 36.0–46.0)
Hemoglobin: 7 g/dL — ABNORMAL LOW (ref 12.0–15.0)
Immature Granulocytes: 1 %
Lymphocytes Relative: 21 %
Lymphs Abs: 2 10*3/uL (ref 0.7–4.0)
MCH: 24.2 pg — ABNORMAL LOW (ref 26.0–34.0)
MCHC: 29.7 g/dL — ABNORMAL LOW (ref 30.0–36.0)
MCV: 81.7 fL (ref 80.0–100.0)
Monocytes Absolute: 1.1 10*3/uL — ABNORMAL HIGH (ref 0.1–1.0)
Monocytes Relative: 11 %
Neutro Abs: 5.7 10*3/uL (ref 1.7–7.7)
Neutrophils Relative %: 58 %
Platelets: 308 10*3/uL (ref 150–400)
RBC: 2.89 MIL/uL — ABNORMAL LOW (ref 3.87–5.11)
RDW: 22.5 % — ABNORMAL HIGH (ref 11.5–15.5)
WBC: 9.4 10*3/uL (ref 4.0–10.5)
nRBC: 0 % (ref 0.0–0.2)

## 2018-12-29 LAB — GLUCOSE, CAPILLARY
Glucose-Capillary: 54 mg/dL — ABNORMAL LOW (ref 70–99)
Glucose-Capillary: 126 mg/dL — ABNORMAL HIGH (ref 70–99)
Glucose-Capillary: 119 mg/dL — ABNORMAL HIGH (ref 70–99)
Glucose-Capillary: 79 mg/dL (ref 70–99)

## 2018-12-29 LAB — CULTURE, BLOOD (ROUTINE X 2)
Culture: NO GROWTH
Culture: NO GROWTH
Special Requests: ADEQUATE
Special Requests: ADEQUATE

## 2018-12-29 LAB — MAGNESIUM: Magnesium: 1.7 mg/dL (ref 1.7–2.4)

## 2018-12-29 MED ORDER — K PHOS MONO-SOD PHOS DI & MONO 155-852-130 MG PO TABS
500.0000 mg | ORAL_TABLET | Freq: Three times a day (TID) | ORAL | Status: DC
  Filled 2018-12-29 (×2): qty 2

## 2018-12-29 MED ORDER — SODIUM PHOSPHATES 45 MMOLE/15ML IV SOLN
10.0000 mmol | Freq: Once | INTRAVENOUS | Status: AC
  Administered 2018-12-29: 11:00:00 10 mmol via INTRAVENOUS
  Filled 2018-12-29: qty 3.33

## 2018-12-29 MED ORDER — POTASSIUM CHLORIDE CRYS ER 20 MEQ PO TBCR
40.0000 meq | EXTENDED_RELEASE_TABLET | Freq: Two times a day (BID) | ORAL | Status: DC
  Administered 2018-12-29 – 2018-12-31 (×5): 40 meq via ORAL
  Filled 2018-12-29 (×5): qty 2

## 2018-12-29 NOTE — Progress Notes (Signed)
RN and RT tried to get patient to wear Bipap for the night.  She stated, "she was not wearing that thing".  She also informed this RT that the reason she can't sleep is because nobody will leave her alone.  No distress noted at this time.  Patient is on [place] on 5L with an O2 sat of 96%,  Will continue to monitor.

## 2018-12-29 NOTE — Care Management Important Message (Signed)
Important Message  Patient Details  Name: [name] MRN: [id number] Date of Birth: Jan 08, 1965   Medicare Important Message Given:  Yes     [name] 12/29/2018, 12:56 PM

## 2018-12-29 NOTE — Progress Notes (Signed)
[name] [facility]  PROGRESS NOTE    [name]  [id number] DOB: 11/18/1965 DOA: 12/24/2018 PCP: [name], PA-C   Brief Narrative:   Erin Veasy Pauldingis a 53 y.o.femalewith medical history significant ofasthma, COPD, stage IV CKD, morbid obesity, depression, anxiety, hypertension, insulin-dependent diabetes mellitus, left-sided DKA, diabetic neuropathy, GERD, HIV, hyperlipidemia was at home today and was trying to get up from the commode reports feeling dizzy and fell on her right side. She reports pain on the right hip. On arrival to ED she underwent x-rays of the right hip and was found to have a Possible left pelvic bone fractures. Probable intertrochanteric fracture of the proximal right femur. As per the patient, she denied hitting her head or syncope. She reports she was trying to get up from the commode after BM, felt dizzy and fell on the right side. She denies any chest pain or sob, fever, chills, nausea, vomiting , abdominal pain, headache, tingling or numbness, diarrhea, dysuria, hematochezia, or hematemesis. She denies any blurry vision at this time.  Orthopedics consulted and she was referred to medical service for admission.  12/29/18: Alert today. Spoke with son. Difficult situation at home. She is not mobile and takes pain meds all day. They are unable to take care of her and she seems unwilling to take care of her self.    Assessment & Plan:   Principal Problem:   Displaced intertrochanteric fracture of right femur, initial encounter for closed fracture ([place]) Active Problems:   Pressure injury of skin  Intertrochanteric fracture of the proximal right femur: - ortho onboard -s/pOpen treatment of intertrochanteric fracture with intramedullary implant - Pain control; watch narcotics and sedating meds d/t lethergy  Dizziness and fall Debility - Possibly secondary to hypotension. - echo: Left Ventricle: Left ventricular ejection fraction,  by visual estimation, is 60 to 65%. The left ventricle has normal function. The left ventricle has no regional wall motion abnormalities. The left ventricular internal cavity size was the left  ventricle is normal in size. There is mildly increased left ventricular hypertrophy. Left ventricular diastolic parameters were normal.Right Ventricle: The right ventricular size is mildly enlarged. No increase in right ventricular wall thickness. Global RV systolic function is has normal systolic function. - negative trp     - PT/OT rec HHPT/OT     - 12/29/18: long convo with son who lives with her, she is unable to take care of self and they are unable to take care of her; may not be appropriate for HHPT/OT  Hypotension  - Probably dehydration.  - Hold home BP meds. - BP ok; can d/c fluids  IDDM complicated by diabetic neuropathy - SSI; monitor - A1c 7.1     - diabetic diet  Hyperlipidemia  - crestor  CKD IIIa - Baseline creatinine ranging between 1 to 1.4. currently its at 0.73  Pyuria -UCxwith yeast - stop rocephin     - typically do not treat candiuria; monitor for now  HIV  - biktarvy  COPD and Asthma: - flonase, breo ellipta - PRN albuterol, duonebs - CXR with possible early infection - d/c rocephin, zithro; transition to augmentin ( end 12/29) - Bipap d/t lethergy, hypoxia  Tobacco abuse:  - Cessation counseling provided.  - Start her on nicotine patch.   Left rib fracture:  - Pain control and incentive spirometry.   Hypomagnesemia Hypophosphatemia Hypokalemia - replete, monitor     - add sodium phos, K+  Low UOP - add fluids - check renal/bladder US -  12/27/18: renal US pending; UOP picked up, continue fluids     - 12/28/18: renal US resulted noted, UOP ok; can stop fluids, encourage PO intake     - 12/29/18: resolved  Lethargy - more lethargic this  AM - question infection vs hypoxia - LFT ok; but mildly elevated ammonia; can consider a dose of lactulose - mentation is improving slightly with BiPap; monitor - avoid sedating meds/narcotics - 12/27/18: her mentation improved through yesterday afternoon with BiPap, I would place her on Bipap at least at night; let's repeat ammonia and check a CTH     - 12/28/18: CTH results noted, she is much more active and alert today     - 12/29/18: Much more interactive today; resolved, needs PAP at night  DVT prophylaxis: lovenox Code Status: FULL   Disposition Plan: TBD  Consultants:   Ortho  Antimicrobials:  . rocephin   Subjective: Denies complaints this AM  Objective: Vitals:   12/29/18 0744 12/29/18 0801 12/29/18 1030 12/29/18 1134  BP: 140/65   (!) 114/50  Pulse: 74  79 82  Resp:   12   Temp: 98.3 F (36.8 C)   98 F (36.7 C)  TempSrc: Oral   Oral  SpO2: 100% 98% 100% 100%  Weight:      Height:       No intake or output data in the 24 hours ending 12/29/18 1443 Filed Weights   12/27/18 0539 12/28/18 0400 12/29/18 0455  Weight: 94.3 kg 99.3 kg 100 kg    Examination:  General: 53 y.o. female resting in bed in NAD Cardiovascular: RRR, +S1, S2, no m/g/r Respiratory: CTABL, no w/r/r GI: BS+, NDNT, no masses noted, no organomegaly noted MSK: No e/c/c Neuro: Alert and following commands Psyc: slow,calm/cooperative   Data Reviewed: I have personally reviewed following labs and imaging studies.  CBC: Recent Labs  Lab 12/24/18 1300 12/25/18 1106 12/25/18 1415 12/26/18 0403 12/27/18 0439 12/28/18 0319 12/29/18 0329  WBC 11.6* 9.7  --  11.9* 10.3 9.8 9.4  NEUTROABS 10.4* 8.1*  --  8.6*  --  6.4 5.7  HGB 12.8 10.7* 11.2* 9.2* 8.0* 7.3* 7.0*  HCT 43.2 35.3* 33.0* 30.3* 26.8* 25.3* 23.6*  MCV 81.1 81.0  --  80.2 81.7 84.1 81.7  PLT 296 238  --  257 202 248 [id number]   Basic Metabolic Panel: Recent Labs  Lab 12/25/18 1106 12/25/18 1415  12/26/18 0403 12/27/18 0439 12/28/18 0319 12/29/18 0329  NA 138 138 137 142 141 140  K 3.7 3.8 3.7 4.0 3.6 3.4*  CL 101  --  102 108 107 104  CO2 26  --  25 25 28 28   GLUCOSE 90  --  139* 146* 101* 66*  BUN 13  --  12 16 12 10   CREATININE 1.44*  --  1.21* 1.02* 0.74 0.73  CALCIUM 8.0*  --  7.7* 7.5* 7.8* 7.7*  MG 1.2*  --  1.5*  --  1.6* 1.7  PHOS 4.1  --  2.9  --  2.1* 1.7*   GFR: Estimated Creatinine Clearance: 102.9 mL/min (by C-G formula based on SCr of 0.73 mg/dL). Liver Function Tests: Recent Labs  Lab 12/24/18 1300 12/25/18 1106 12/26/18 0403 12/26/18 0827 12/28/18 0319 12/29/18 0329  AST 21  --   --  19  --   --   ALT 15  --   --  9  --   --   ALKPHOS 187*  --   --  124  --   --  BILITOT 1.0  --   --  0.2*  --   --   PROT 7.0  --   --  5.6*  --   --   ALBUMIN 2.4* 2.0* 1.8* 1.7* 1.5* 1.4*   No results for input(s): LIPASE, AMYLASE in the last 168 hours. Recent Labs  Lab 12/26/18 0827 12/28/18 0319  AMMONIA 42* 37*   Coagulation Profile: No results for input(s): INR, PROTIME in the last 168 hours. Cardiac Enzymes: No results for input(s): CKTOTAL, CKMB, CKMBINDEX, TROPONINI in the last 168 hours. BNP (last 3 results) No results for input(s): PROBNP in the last 8760 hours. HbA1C: No results for input(s): HGBA1C in the last 72 hours. CBG: Recent Labs  Lab 12/28/18 1711 12/28/18 1753 12/28/18 2150 12/29/18 0741 12/29/18 1132  GLUCAP 58* 80 101* 54* 126*   Lipid Profile: No results for input(s): CHOL, HDL, LDLCALC, TRIG, CHOLHDL, LDLDIRECT in the last 72 hours. Thyroid Function Tests: No results for input(s): TSH, T4TOTAL, FREET4, T3FREE, THYROIDAB in the last 72 hours. Anemia Panel: No results for input(s): VITAMINB12, FOLATE, FERRITIN, TIBC, IRON, RETICCTPCT in the last 72 hours. Sepsis Labs: Recent Labs  Lab 12/24/18 1300 12/24/18 1540 12/26/18 0526 12/26/18 0827  LATICACIDVEN 1.7 0.9 1.2 0.8    Recent Results (from the past 240  hour(s))  Blood culture (routine x 2)     Status: None   Collection Time: 12/24/18 12:39 PM   Specimen: BLOOD  Result Value Ref Range Status   Specimen Description BLOOD SITE NOT SPECIFIED  Final   Special Requests   Final    BOTTLES DRAWN AEROBIC AND ANAEROBIC Blood Culture adequate volume   Culture   Final    NO GROWTH 5 DAYS Performed at [facility], [address] [place], [place] [address]    Report Status 12/29/2018 FINAL  Final  Blood culture (routine x 2)     Status: None   Collection Time: 12/24/18  1:37 PM   Specimen: BLOOD  Result Value Ref Range Status   Specimen Description BLOOD SITE NOT SPECIFIED  Final   Special Requests   Final    BOTTLES DRAWN AEROBIC AND ANAEROBIC Blood Culture adequate volume   Culture   Final    NO GROWTH 5 DAYS Performed at [facility], [place] [address] [place], [place] [address]    Report Status 12/29/2018 FINAL  Final  SARS CORONAVIRUS 2 (TAT 6-24 HRS) Nasopharyngeal Nasopharyngeal Swab     Status: None   Collection Time: 12/24/18  2:21 PM   Specimen: Nasopharyngeal Swab  Result Value Ref Range Status   SARS Coronavirus 2 NEGATIVE NEGATIVE Final    Comment: (NOTE) SARS-CoV-2 target nucleic acids are NOT DETECTED. The SARS-CoV-2 RNA is generally detectable in upper and lower respiratory specimens during the acute phase of infection. Negative results do not preclude SARS-CoV-2 infection, do not rule out co-infections with other pathogens, and should not be used as the sole basis for treatment or other patient management decisions. Negative results must be combined with clinical observations, patient history, and epidemiological information. The expected result is Negative. Fact Sheet for Patients: SugarRoll.be Fact Sheet for Healthcare Providers: https://www.woods-mathews.com/ This test is not yet approved or cleared by the [place] FDA and  has been authorized for detection  and/or diagnosis of SARS-CoV-2 by FDA under an Emergency Use Authorization (EUA). This EUA will remain  in effect (meaning this test can be used) for the duration of the COVID-19 declaration under Section  56 4(b)(1) of the Act, 21 U.S.C. section 360bbb-3(b)(1), unless the authorization is terminated or revoked sooner. Performed at [facility], [place] [address], [place], [place] [address]   Surgical pcr screen     Status: None   Collection Time: 12/24/18 10:13 PM   Specimen: Nasal Mucosa; Nasal Swab  Result Value Ref Range Status   MRSA, PCR NEGATIVE NEGATIVE Final   Staphylococcus aureus NEGATIVE NEGATIVE Final    Comment: (NOTE) The Xpert SA Assay (FDA approved for NASAL specimens in patients 82 years of age and older), is one component of a comprehensive surveillance program. It is not intended to diagnose infection nor to guide or monitor treatment. Performed at [facility], [place] [address] [place], [place] [address]   Culture, Urine     Status: Abnormal   Collection Time: 12/25/18 10:30 PM   Specimen: Urine, Catheterized  Result Value Ref Range Status   Specimen Description URINE, CATHETERIZED  Final   Special Requests   Final    NONE Performed at [facility], [place] [address] [place], [place] [address]    Culture >=100,000 COLONIES/mL YEAST (A)  Final   Report Status 12/27/2018 FINAL  Final      Radiology Studies: CT HEAD WO CONTRAST  Result Date: 12/27/2018 CLINICAL DATA:  Altered mental status. Recent fall. EXAM: CT HEAD WITHOUT CONTRAST TECHNIQUE: Contiguous axial images were obtained from the base of the skull through the vertex without intravenous contrast. COMPARISON:  CT head without contrast 06/02/2018 FINDINGS: Brain: Atrophy and white matter changes are stable. No acute infarct, hemorrhage, or mass lesion is present. The ventricles are proportionate to the degree of atrophy. No significant extraaxial fluid collection is present. Vascular:  Atherosclerotic changes are present within the cavernous internal carotid arteries bilaterally. There is no hyperdense vessel. Skull: Calvarium is intact. No focal lytic or blastic lesions are present. Left supraorbital soft tissue swelling is present without underlying fracture. No radiopaque foreign body is present. Sinuses/Orbits: The paranasal sinuses and mastoid air cells are clear. The globes and orbits are within normal limits. IMPRESSION: 1. Stable atrophy and white matter disease. 2. No acute intracranial abnormality or significant interval change. 3. Left supraorbital soft tissue swelling without underlying fracture. Electronically Signed   By: San Morelle M.D.   On: 12/27/2018 18:08     Scheduled Meds: . amoxicillin-clavulanate  1 tablet Oral Q12H  . bictegravir-emtricitabine-tenofovir AF  1 tablet Oral Daily  . docusate sodium  100 mg Oral BID  . enoxaparin (LOVENOX) injection  40 mg Subcutaneous Q24H  . feeding supplement (ENSURE ENLIVE)  237 mL Oral QHS  . fluticasone  1 spray Each Nare Daily  . fluticasone furoate-vilanterol  1 puff Inhalation Daily   And  . umeclidinium bromide  1 puff Inhalation Daily  . insulin aspart  0-5 Units Subcutaneous QHS  . insulin aspart  0-9 Units Subcutaneous TID WC  . insulin glargine  20 Units Subcutaneous BID  . multivitamin with minerals  1 tablet Oral Daily  . pantoprazole  40 mg Oral Daily  . potassium chloride  40 mEq Oral BID  . pregabalin  225 mg Oral BID  . rosuvastatin  20 mg Oral Daily  . sertraline  150 mg Oral Daily   Continuous Infusions: . sodium chloride 125 mL/hr at 12/29/18 0015  . methocarbamol (ROBAXIN) IV    . sodium chloride    . sodium phosphate  Dextrose 5% IVPB 10 mmol (12/29/18 1044)  LOS: 5 days    Time spent: 25 minutes spent in the coordination of care today.    Jonnie Finner, DO [facility]  If 7PM-7AM, please contact night-coverage [email or web address] 12/29/2018, 2:43 PM

## 2018-12-29 NOTE — Progress Notes (Addendum)
Held 70/30 insulin this morning, CBG 54. CBGs 126 at 1200 and 119 at 1700.  Patient eating better today, Ate most of breakfast and all of her lunch. Drinking diet soda and ensure.

## 2018-12-29 NOTE — Progress Notes (Signed)
RN informed RT that pt constantly keeps pulling her BIPAP off even though RN encouraging/explaining to pt the importance of her wearing it at night as ordered by her MD. Pt was placed back on [place] 5 Lpm. RT will continue to monitor.

## 2018-12-29 NOTE — Progress Notes (Signed)
Patient took BiPAP off, encourage patient to wear it longer but patient refused. Placed patient back on 5L, will continue to monitor patient.

## 2018-12-29 NOTE — Progress Notes (Signed)
Physical Therapy Treatment Patient Details Name: DIOSELINA BRUMBAUGH MRN: [id number] DOB: 10-28-1965 Today's Date: 12/29/2018    History of Present Illness IDALIA ALLBRITTON is a 53 y.o. female with medical history significant for but not limited to asthma, COPD, stage IV CKD, morbid obesity, depression, anxiety, hypertension, insulin-dependent diabetes mellitus, left-sided BKA, diabetic neuropathy, GERD, HIV, hyperlipidemia who fell from the commode at home.  Found to have Intertrochanteric fracture of the proximal right femur and now s/p IM nail. Post-op was lethargic and placed on Bi-pap and moved to a progressive care unit.     PT Comments    Patient received in bed, A&Ox2 and making confused statements throughout session (very confused about purewick, thinks it is 2022, and stated "I'll sign this later" when handed and asked to don a mask). MaxA for reorientation and problem solving during session. Attempted supine to sit with totalAx2 and various techniques, patient unable to tolerate due to pain. RN made aware of request for pain medication and reports that she is not due for another dose quite yet- will plan to premedicate prior to future PT sessions. She was positioned to comfort and left in bed with all needs met, bed alarm active this afternoon.    Follow Up Recommendations  Supervision for mobility/OOB;Supervision/Assistance - 24 hour;SNF     Equipment Recommendations  None recommended by PT    Recommendations for Other Services       Precautions / Restrictions Precautions Precautions: Fall Required Braces or Orthoses: Other Brace Other Brace: L LE prosthesis Restrictions Weight Bearing Restrictions: No RLE Weight Bearing: Weight bearing as tolerated    Mobility  Bed Mobility Overal bed mobility: Needs Assistance Bed Mobility: Supine to Sit     Supine to sit: Total assist;+2 for physical assistance     General bed mobility comments: attempted supine to sit with  totalAx2, patient began screaming and crying out due to pain- unable to tolerate transfer attempt due to pain  Transfers                 General transfer comment: deferred- pain  Ambulation/Gait             General Gait Details: deferred- pain   Stairs             Wheelchair Mobility    Modified Rankin (Stroke Patients Only)       Balance                                            Cognition Arousal/Alertness: Awake/alert Behavior During Therapy: Flat affect Overall Cognitive Status: No family/caregiver present to determine baseline cognitive functioning Area of Impairment: Orientation;Attention;Memory;Safety/judgement;Awareness;Following commands;Problem solving                 Orientation Level: Disoriented to;Situation;Time Current Attention Level: Sustained Memory: Decreased short-term memory;Decreased recall of precautions Following Commands: Follows one step commands with increased time;Follows one step commands inconsistently Safety/Judgement: Decreased awareness of safety;Decreased awareness of deficits Awareness: Intellectual Problem Solving: Slow processing;Decreased initiation General Comments: A&Ox2, makes odd statements throughout session including "I'll sign this later" when asked to don a mask, as well as confusion over how to use purewick      Exercises      General Comments General comments (skin integrity, edema, etc.): very limited tx session due to pain- really needs to be premedicated prior to therapy  and will plan to do so next session      Pertinent Vitals/Pain Pain Assessment: Faces Pain Score: 9  Pain Location: R LE with movement Pain Descriptors / Indicators: Grimacing;Guarding Pain Intervention(s): Limited activity within patient's tolerance;Monitored during session;Repositioned    Home Living                      Prior Function            PT Goals (current goals can now be found  in the care plan section) Acute Rehab PT Goals Patient Stated Goal: to be able to transfer with prosthesis PT Goal Formulation: Patient unable to participate in goal setting Time For Goal Achievement: 01/03/19 Potential to Achieve Goals: Good Progress towards PT goals: Not progressing toward goals - comment(pain limited)    Frequency    Min 3X/week      PT Plan Current plan remains appropriate    Co-evaluation              AM-PAC PT "6 Clicks" Mobility   Outcome Measure  Help needed turning from your back to your side while in a flat bed without using bedrails?: Total Help needed moving from lying on your back to sitting on the side of a flat bed without using bedrails?: Total Help needed moving to and from a bed to a chair (including a wheelchair)?: Total Help needed standing up from a chair using your arms (e.g., wheelchair or bedside chair)?: Total Help needed to walk in hospital room?: Total Help needed climbing 3-5 steps with a railing? : Total 6 Click Score: 6    End of Session Equipment Utilized During Treatment: Oxygen Activity Tolerance: Patient limited by pain Patient left: in bed;with call bell/phone within reach;with bed alarm set Nurse Communication: Patient requests pain meds PT Visit Diagnosis: Unsteadiness on feet (R26.81)     Time: 2608-8835 PT Time Calculation (min) (ACUTE ONLY): 24 min  Charges:  $Therapeutic Activity: 8-22 mins $Self Care/Home Management: 8-22                     Windell Norfolk, DPT, PN1   Supplemental Physical Therapist [place]    Pager [phone] Acute Rehab Office [phone]

## 2018-12-29 NOTE — Progress Notes (Deleted)
Inpatient Diabetes Program Recommendations  AACE/ADA: New Consensus Statement on Inpatient Glycemic Control (2015)  Target Ranges:  Prepandial:   less than 140 mg/dL      Peak postprandial:   less than 180 mg/dL (1-2 hours)      Critically ill patients:  140 - 180 mg/dL   Lab Results  Component Value Date   GLUCAP 126 (H) 12/29/2018   HGBA1C 7.1 (H) 12/24/2018    Review of Glycemic Control  Results for [name], [name] (MRN [id number]) as of 12/29/2018 15:24  Ref. Range 12/28/2018 17:11 12/28/2018 17:53 12/28/2018 21:50 12/29/2018 07:41 12/29/2018 11:32  Glucose-Capillary Latest Ref Range: 70 - 99 mg/dL 58 (L) 80 101 (H) 54 (L) 126 (H)      Diabetes history: DM2 Outpatient Diabetes medications: Jardiance 10 mg daily; Toujeo 50 units twice daily; U-500 40 units BID Current orders for Inpatient glycemic control: Lantus 20 units BID; Novolog 0-9 TID with meals and 0-5 at bedtime  Inpatient Diabetes Program Recommendations:     Noted: Lantus was changed from 40 unit daily to 20 units twice daily  -Please consider decreasing Lantus to 30 units daily  Thank you, Geoffry Paradise, RN, BSN Diabetes Coordinator Inpatient Diabetes Program [phone] (team pager from 8a-5p)

## 2018-12-29 NOTE — TOC Initial Note (Addendum)
Transition of Care [facility]) - Initial/Assessment Note    Patient Details  Name: [name] MRN: [id number] Date of Birth: 04/01/1965  Transition of Care [facility]) CM/SW Contact:    Carles Collet, RN Phone Number: 12/29/2018, 11:08 AM  Clinical Narrative:                Damaris Schooner w patient at bedside. She states that she lives at home with her son and daughter in law. She uses a RW at home. She states that she has nocturnal home oxygen through [employer].  If she needs continuous oxygen at DC she will need new O2 order sent to [employer] and oxygen for transport home, I verified she does not have any portable tanks at home. She stated that if she DCs to home her son will able to provide transport.  She is deciding between SNF and home and if she goes home we discussed [place] ratings through Medicare and she would like to use [place].   16:50 Spoke w patient again this afternoon. She is agreeable to short term SNF at DC.      Barriers to Discharge: Continued Medical Work up   Patient Goals and CMS Choice        Expected Discharge Plan and Services                                                Prior Living Arrangements/Services   Lives with:: Adult Children              Current home services: DME    Activities of Daily Living Home Assistive Devices/Equipment: Prosthesis, Wheelchair ADL Screening (condition at time of admission) Patient's cognitive ability adequate to safely complete daily activities?: Yes Is the patient deaf or have difficulty hearing?: No Does the patient have difficulty seeing, even when wearing glasses/contacts?: Yes Does the patient have difficulty concentrating, remembering, or making decisions?: Yes Patient able to express need for assistance with ADLs?: Yes Does the patient have difficulty dressing or bathing?: No Independently performs ADLs?: Yes (appropriate for developmental age) Does the patient have difficulty walking or climbing stairs?:  Yes Weakness of Legs: None Weakness of Arms/Hands: None  Permission Sought/Granted                  Emotional Assessment              Admission diagnosis:  Fall [W19.XXXA] Closed fracture of right hip, initial encounter ([place]) [S72.001A] Closed left hip fracture, initial encounter [facility]) [S72.002A] Patient Active Problem List   Diagnosis Date Noted  . Pressure injury of skin 12/26/2018  . Displaced intertrochanteric fracture of right femur, initial encounter for closed fracture ([place]) 12/24/2018  . Closed right ankle fracture 07/02/2018  . Anemia of chronic disease 07/02/2018  . Displaced bimalleolar fracture of right ankle, closed, initial encounter 06/02/2018  . Avascular necrosis of bone of left hip ([place]) 04/03/2018  . Mucopurulent chronic bronchitis ([place]) 03/11/2018  . Allergic rhinitis 03/11/2018  . Anxiety and depression 12/29/2017  . Acute respiratory failure with hypoxia ([place]) 12/28/2017  . Syncope 08/29/2017  . DKA (diabetic ketoacidoses) ([place]) 03/02/2017  . COPD exacerbation ([place]) 02/23/2017  . Acute on chronic respiratory failure with hypoxia and hypercapnia ([place]) 08/19/2016  . Leukocytosis 08/19/2016  . CKD (chronic kidney disease), stage III ([place]) 08/19/2016  . Altered mental status 08/19/2016  .  Asthma exacerbation 06/18/2016  . Insomnia 06/12/2016  . Difficult intravenous access   . Acute on chronic respiratory failure with hypoxia ([place]) 05/19/2016  . NSTEMI (non-ST elevated myocardial infarction) ([place]) 05/19/2016  . Acute metabolic encephalopathy [id number]  . Hyperlipidemia   . Class 3 obesity due to excess calories with serious comorbidity and body mass index (BMI) of 40.0 to 44.9 in adult   . Obesity hypoventilation syndrome ([place])   . Diastolic dysfunction [id number]  . Diabetic neuropathy ([place]) 11/10/2014  . Fracture of distal femur ([place]) 07/08/2014  . Osteoporosis 07/08/2014  . Femur fracture, left ([place]) 07/08/2014  . Colles' fracture of left  radius   . Acute kidney injury superimposed on CKD ([place]) 05/03/2014  . Left hip pain 05/03/2014  . Perimenopausal symptoms 02/13/2012  . Hyponatremia 12/29/2011  . Asthma exacerbation attacks 12/28/2011  . Bronchitis 12/28/2011  . Repeated falls 07/30/2011  . Hip fracture, left ([place]) 07/16/2011  . Femoral neck fracture ([place]) 07/16/2011  . Tobacco abuse 04/14/2010  . Hyperlipidemia associated with type 2 diabetes mellitus ([place]) 12/23/2008  . COPD (chronic obstructive pulmonary disease) ([place]) 11/29/2008  . THRUSH 05/20/2008  . Human immunodeficiency virus (HIV) disease ([place]) 04/19/2008  . Diabetes mellitus type 2 with complications, uncontrolled ([place]) 04/19/2008  . Mood disorder ([place]) 04/19/2008  . Hereditary and idiopathic peripheral neuropathy 04/19/2008  . Hypertension associated with diabetes ([place]) 04/19/2008   PCP:  [name], PA-C Pharmacy:   [facility] [place], [address] [place] [address] Phone: [phone] Fax: [phone]     Social Determinants of Health (SDOH) Interventions    Readmission Risk Interventions Readmission Risk Prevention Plan 06/17/2018  Transportation Screening Complete  Medication Review ([place]) Complete  PCP or Specialist appointment within 3-5 days of discharge Complete  HRI or Home Care Consult Complete  SW Recovery Care/Counseling Consult Complete  Palliative Care Screening Not [place] Complete  Some recent data might be hidden

## 2018-12-30 LAB — RENAL FUNCTION PANEL
Albumin: 1.4 g/dL — ABNORMAL LOW (ref 3.5–5.0)
Anion gap: 9 (ref 5–15)
BUN: 10 mg/dL (ref 6–20)
CO2: 28 mmol/L (ref 22–32)
Calcium: 8.1 mg/dL — ABNORMAL LOW (ref 8.9–10.3)
Chloride: 103 mmol/L (ref 98–111)
Creatinine, Ser: 0.71 mg/dL (ref 0.44–1.00)
GFR calc Af Amer: >60 mL/min (ref >60)
GFR calc non Af Amer: >60 mL/min (ref >60)
Glucose, Bld: 104 mg/dL — ABNORMAL HIGH (ref 70–99)
Phosphorus: 2.3 mg/dL — ABNORMAL LOW (ref 2.5–4.6)
Potassium: 4.8 mmol/L (ref 3.5–5.1)
Sodium: 140 mmol/L (ref 135–145)

## 2018-12-30 LAB — CBC WITH DIFFERENTIAL/PLATELET
Abs Immature Granulocytes: 0.05 10*3/uL (ref 0.00–0.07)
Basophils Absolute: 0.1 10*3/uL (ref 0.0–0.1)
Basophils Relative: 1 %
Eosinophils Absolute: 0.9 10*3/uL — ABNORMAL HIGH (ref 0.0–0.5)
Eosinophils Relative: 10 %
HCT: 24.7 % — ABNORMAL LOW (ref 36.0–46.0)
Hemoglobin: 7.3 g/dL — ABNORMAL LOW (ref 12.0–15.0)
Immature Granulocytes: 1 %
Lymphocytes Relative: 21 %
Lymphs Abs: 2 10*3/uL (ref 0.7–4.0)
MCH: 24.3 pg — ABNORMAL LOW (ref 26.0–34.0)
MCHC: 29.6 g/dL — ABNORMAL LOW (ref 30.0–36.0)
MCV: 82.3 fL (ref 80.0–100.0)
Monocytes Absolute: 1.1 10*3/uL — ABNORMAL HIGH (ref 0.1–1.0)
Monocytes Relative: 11 %
Neutro Abs: 5.4 10*3/uL (ref 1.7–7.7)
Neutrophils Relative %: 56 %
Platelets: 338 10*3/uL (ref 150–400)
RBC: 3 MIL/uL — ABNORMAL LOW (ref 3.87–5.11)
RDW: 22.4 % — ABNORMAL HIGH (ref 11.5–15.5)
WBC: 9.4 10*3/uL (ref 4.0–10.5)
nRBC: 0 % (ref 0.0–0.2)

## 2018-12-30 LAB — GLUCOSE, CAPILLARY
Glucose-Capillary: 108 mg/dL — ABNORMAL HIGH (ref 70–99)
Glucose-Capillary: 116 mg/dL — ABNORMAL HIGH (ref 70–99)
Glucose-Capillary: 75 mg/dL (ref 70–99)
Glucose-Capillary: 77 mg/dL (ref 70–99)
Glucose-Capillary: 94 mg/dL (ref 70–99)

## 2018-12-30 LAB — PREPARE RBC (CROSSMATCH)

## 2018-12-30 LAB — MAGNESIUM: Magnesium: 1.5 mg/dL — ABNORMAL LOW (ref 1.7–2.4)

## 2018-12-30 MED ORDER — MAGNESIUM SULFATE 4 GM/100ML IV SOLN
4.0000 g | Freq: Once | INTRAVENOUS | Status: AC
  Administered 2018-12-30: 4 g via INTRAVENOUS
  Filled 2018-12-30: qty 100

## 2018-12-30 MED ORDER — HYDROCODONE-ACETAMINOPHEN 5-325 MG PO TABS
1.0000 | ORAL_TABLET | Freq: Four times a day (QID) | ORAL | Status: DC | PRN
  Administered 2018-12-31 – 2019-01-06 (×15): 2 via ORAL
  Filled 2018-12-30 (×15): qty 2

## 2018-12-30 MED ORDER — SODIUM CHLORIDE 0.9% IV SOLUTION: Freq: Once | INTRAVENOUS | Status: AC

## 2018-12-30 NOTE — Progress Notes (Addendum)
PROGRESS NOTE    [name]  [id number] DOB: Apr 01, 1965 DOA: 12/24/2018 PCP: [name], PA-C   Brief Narrative: 53 y.o.femalewith medical history significant ofasthma, COPD, stage IV CKD, morbid obesity, depression, anxiety, hypertension, insulin-dependent diabetes mellitus, left-sided DKA, diabetic neuropathy, GERD, HIV, hyperlipidemia was at home today and was trying to get up from the commode reports feeling dizzy and fell on her right side. She reports pain on the right hip. On arrival to ED she underwent x-rays of the right hip and was found to have a Possible left pelvic bone fractures. Probable intertrochanteric fracture of the proximal right femur. As per the patient, she denied hitting her head or syncope. She reports she was trying to get up from the commode after BM, felt dizzy and fell on the right side. She denies any chest pain or sob, fever, chills, nausea, vomiting , abdominal pain, headache, tingling or numbness, diarrhea, dysuria, hematochezia, or hematemesis. She denies any blurry vision at this time.  Orthopedics consulted and she was referred to medical service for admission  Assessment & Plan:   Principal Problem:   Displaced intertrochanteric fracture of right femur, initial encounter for closed fracture ([place]) Active Problems:   Pressure injury of skin   Intertrochanteric fracture of the proximal right femur: - ortho onboard -s/pOpen treatment of intertrochanteric fracture with intramedullary implant - Pain control; watch narcotics and sedating meds d/t lethergy  Dizziness and fall Debility - Possibly secondary to hypotension. - echo: Left Ventricle: Left ventricular ejection fraction, by visual estimation, is 60 to 65%. The left ventricle has normal function. The left ventricle has no regional wall motion abnormalities. The left ventricular internal cavity size was the left  ventricle is normal in size. There is  mildly increased left ventricular hypertrophy. Left ventricular diastolic parameters were normal.Right Ventricle: The right ventricular size is mildly enlarged. No increase in right ventricular wall thickness. Global RV systolic function is has normal systolic function. - negative trp - PT/OT rec HHPT/OT     - 12/29/18: long convo with son who lives with her, she is unable to take care of self and they are unable to take care of her; may not be appropriate for HHPT/OT  Hypotension -BP not recorded appropriate - Probably dehydration.  - Hold home BP meds. - BP ok;can d/c fluids  IDDM complicated by diabetic neuropathy-blood glucose 126, 119, 79, 116, 75. - SSI; monitor - A1c 7.1 - diabetic diet  Hyperlipidemia  - crestor  CKD IIIa - Baseline creatinine ranging between 1 to 1.4. currently its at 0.73  Pyuria -UCxwith yeast -stop rocephin - typically do not treat candiuria; monitor for now  HIV  - biktarvy  COPD and Asthma: - flonase, breo ellipta - PRN albuterol, duonebs - CXR with possible early infection -d/c rocephin, zithro; transition to augmentin ( end 12/29) - Bipap d/t lethergy, hypoxia  Tobacco abuse:  - Cessation counseling provided.  - Start her on nicotine patch.   Left rib fracture:  - Pain control and incentive spirometry.   Hypomagnesemia magnesium 1.7 replete Hypophosphatemia Hypokalemia - replete, monitor     - add sodium phos, K+  Lethargy-I am able to wake her up she is answering my questions appropriately however she falls asleep very fast.  Patient needs CPAP at night.  CT head stable atrophy and white matter disease.  No acute intracranial abnormality. - Anemia-hemoglobin 7.3 down from 7 check FOBT and transfuse 1 unit of packed RBC today. Pressure Injury 12/26/18 Heel Right Stage  2 -  Partial thickness loss of dermis presenting as a  shallow open injury with a red, pink wound bed without slough. Fluid Blister (Active)  12/26/18 0800  Location: Heel  Location Orientation: Right  Staging: Stage 2 -  Partial thickness loss of dermis presenting as a shallow open injury with a red, pink wound bed without slough.  Wound Description (Comments): Fluid Blister  Present on Admission:       Nutrition Problem: Increased nutrient needs Etiology: post-op healing     Signs/Symptoms: estimated needs    Interventions: MVI, Ensure Enlive (each supplement provides 350kcal and 20 grams of protein)  Estimated body mass index is 31.57 kg/m as calculated from the following:   Height as of this encounter: 5\' 10"  (1.778 m).   Weight as of this encounter: 99.8 kg. DVT prophylaxis: lovenox Code Status: FULL   Disposition Plan: TBD  Consultants:   Ortho  Antimicrobials:   rocephin    Subjective:  I was able to wake her up she will follow my conversation but falls asleep during the conversation.  Does not follow any commands. Objective: Vitals:   12/29/18 2100 12/30/18 0016 12/30/18 0452 12/30/18 0747  BP: (!) 119/54 (!) 125/55 (!) 121/56 (!) 132/11  Pulse: 71 75 76   Resp:  15 14 13   Temp: 98.2 F (36.8 C) 98.3 F (36.8 C) 98.7 F (37.1 C) 98.1 F (36.7 C)  TempSrc: Oral Oral Oral Oral  SpO2: 98% 96% 98%   Weight:   99.8 kg   Height:        Intake/Output Summary (Last 24 hours) at 12/30/2018 1058 Last data filed at 12/30/2018 0456 Gross per 24 hour  Intake 946.76 ml  Output 1350 ml  Net -403.24 ml   Filed Weights   12/28/18 0400 12/29/18 0455 12/30/18 0452  Weight: 99.3 kg 100 kg 99.8 kg    Examination:  General exam: Appears calm and comfortable  Respiratory system: Clear to auscultation. Respiratory effort normal. Cardiovascular system: S1 & S2 heard, RRR. No JVD, murmurs, rubs, gallops or clicks. No pedal edema. Gastrointestinal system: Abdomen is nondistended, soft and nontender. No  organomegaly or masses felt. Normal bowel sounds heard. Central nervous system: Alert and oriented. No focal neurological deficits. Extremities: Symmetric 5 x 5 power. Skin: No rashes, lesions or ulcers Psychiatry: Unable to assess    Data Reviewed: I have personally reviewed following labs and imaging studies  CBC: Recent Labs  Lab 12/25/18 1106 12/26/18 0403 12/27/18 0439 12/28/18 0319 12/29/18 0329 12/30/18 0345  WBC 9.7 11.9* 10.3 9.8 9.4 9.4  NEUTROABS 8.1* 8.6*  --  6.4 5.7 5.4  HGB 10.7* 9.2* 8.0* 7.3* 7.0* 7.3*  HCT 35.3* 30.3* 26.8* 25.3* 23.6* 24.7*  MCV 81.0 80.2 81.7 84.1 81.7 82.3  PLT 238 257 202 248 308 [id number]   Basic Metabolic Panel: Recent Labs  Lab 12/25/18 1106 12/26/18 0403 12/27/18 0439 12/28/18 0319 12/29/18 0329 12/30/18 0345  NA 138 137 142 141 140 140  K 3.7 3.7 4.0 3.6 3.4* 4.8  CL 101 102 108 107 104 103  CO2 26 25 25 28 28 28   GLUCOSE 90 139* 146* 101* 66* 104*  BUN 13 12 16 12 10 10   CREATININE 1.44* 1.21* 1.02* 0.74 0.73 0.71  CALCIUM 8.0* 7.7* 7.5* 7.8* 7.7* 8.1*  MG 1.2* 1.5*  --  1.6* 1.7 1.5*  PHOS 4.1 2.9  --  2.1* 1.7* 2.3*   GFR: Estimated Creatinine Clearance: 102.8 mL/min (by C-G formula  based on SCr of 0.71 mg/dL). Liver Function Tests: Recent Labs  Lab 12/24/18 1300 12/26/18 0403 12/26/18 0827 12/28/18 0319 12/29/18 0329 12/30/18 0345  AST 21  --  19  --   --   --   ALT 15  --  9  --   --   --   ALKPHOS 187*  --  124  --   --   --   BILITOT 1.0  --  0.2*  --   --   --   PROT 7.0  --  5.6*  --   --   --   ALBUMIN 2.4* 1.8* 1.7* 1.5* 1.4* 1.4*   No results for input(s): LIPASE, AMYLASE in the last 168 hours. Recent Labs  Lab 12/26/18 0827 12/28/18 0319  AMMONIA 42* 37*   Coagulation Profile: No results for input(s): INR, PROTIME in the last 168 hours. Cardiac Enzymes: No results for input(s): CKTOTAL, CKMB, CKMBINDEX, TROPONINI in the last 168 hours. BNP (last 3 results) No results for input(s): PROBNP in  the last 8760 hours. HbA1C: No results for input(s): HGBA1C in the last 72 hours. CBG: Recent Labs  Lab 12/29/18 1132 12/29/18 1700 12/29/18 2109 12/30/18 0015 12/30/18 0744  GLUCAP 126* 119* 79 116* 75   Lipid Profile: No results for input(s): CHOL, HDL, LDLCALC, TRIG, CHOLHDL, LDLDIRECT in the last 72 hours. Thyroid Function Tests: No results for input(s): TSH, T4TOTAL, FREET4, T3FREE, THYROIDAB in the last 72 hours. Anemia Panel: No results for input(s): VITAMINB12, FOLATE, FERRITIN, TIBC, IRON, RETICCTPCT in the last 72 hours. Sepsis Labs: Recent Labs  Lab 12/24/18 1300 12/24/18 1540 12/26/18 0526 12/26/18 0827  LATICACIDVEN 1.7 0.9 1.2 0.8    Recent Results (from the past 240 hour(s))  Blood culture (routine x 2)     Status: None   Collection Time: 12/24/18 12:39 PM   Specimen: BLOOD  Result Value Ref Range Status   Specimen Description BLOOD SITE NOT SPECIFIED  Final   Special Requests   Final    BOTTLES DRAWN AEROBIC AND ANAEROBIC Blood Culture adequate volume   Culture   Final    NO GROWTH 5 DAYS Performed at [facility], [address], [place], [place] [address]    Report Status 12/29/2018 FINAL  Final  Blood culture (routine x 2)     Status: None   Collection Time: 12/24/18  1:37 PM   Specimen: BLOOD  Result Value Ref Range Status   Specimen Description BLOOD SITE NOT SPECIFIED  Final   Special Requests   Final    BOTTLES DRAWN AEROBIC AND ANAEROBIC Blood Culture adequate volume   Culture   Final    NO GROWTH 5 DAYS Performed at [facility], [place] [address], [place], [place] [address]    Report Status 12/29/2018 FINAL  Final  SARS CORONAVIRUS 2 (TAT 6-24 HRS) Nasopharyngeal Nasopharyngeal Swab     Status: None   Collection Time: 12/24/18  2:21 PM   Specimen: Nasopharyngeal Swab  Result Value Ref Range Status   SARS Coronavirus 2 NEGATIVE NEGATIVE Final    Comment: (NOTE) SARS-CoV-2 target nucleic acids are NOT DETECTED. The  SARS-CoV-2 RNA is generally detectable in upper and lower respiratory specimens during the acute phase of infection. Negative results do not preclude SARS-CoV-2 infection, do not rule out co-infections with other pathogens, and should not be used as the sole basis for treatment or other patient management decisions. Negative results must be combined with clinical observations, patient  history, and epidemiological information. The expected result is Negative. Fact Sheet for Patients: SugarRoll.be Fact Sheet for Healthcare Providers: https://www.woods-Kaelen Caughlin.com/ This test is not yet approved or cleared by the [place] FDA and  has been authorized for detection and/or diagnosis of SARS-CoV-2 by FDA under an Emergency Use Authorization (EUA). This EUA will remain  in effect (meaning this test can be used) for the duration of the COVID-19 declaration under Section 56 4(b)(1) of the Act, 21 U.S.C. section 360bbb-3(b)(1), unless the authorization is terminated or revoked sooner. Performed at [facility], [place] [address], [place], [place] [address]   Surgical pcr screen     Status: None   Collection Time: 12/24/18 10:13 PM   Specimen: Nasal Mucosa; Nasal Swab  Result Value Ref Range Status   MRSA, PCR NEGATIVE NEGATIVE Final   Staphylococcus aureus NEGATIVE NEGATIVE Final    Comment: (NOTE) The Xpert SA Assay (FDA approved for NASAL specimens in patients 60 years of age and older), is one component of a comprehensive surveillance program. It is not intended to diagnose infection nor to guide or monitor treatment. Performed at [facility], [place] [address], [place], [place] [address]   Culture, Urine     Status: Abnormal   Collection Time: 12/25/18 10:30 PM   Specimen: Urine, Catheterized  Result Value Ref Range Status   Specimen Description URINE, CATHETERIZED  Final   Special Requests   Final    NONE Performed at [facility], [place] [address], [place], [place] [address]    Culture >=100,000 COLONIES/mL YEAST (A)  Final   Report Status 12/27/2018 FINAL  Final         Radiology Studies: No results found.      Scheduled Meds: . amoxicillin-clavulanate  1 tablet Oral Q12H  . bictegravir-emtricitabine-tenofovir AF  1 tablet Oral Daily  . docusate sodium  100 mg Oral BID  . enoxaparin (LOVENOX) injection  40 mg Subcutaneous Q24H  . feeding supplement (ENSURE ENLIVE)  237 mL Oral QHS  . fluticasone  1 spray Each Nare Daily  . fluticasone furoate-vilanterol  1 puff Inhalation Daily   And  . umeclidinium bromide  1 puff Inhalation Daily  . insulin aspart  0-5 Units Subcutaneous QHS  . insulin aspart  0-9 Units Subcutaneous TID WC  . insulin glargine  20 Units Subcutaneous BID  . multivitamin with minerals  1 tablet Oral Daily  . pantoprazole  40 mg Oral Daily  . potassium chloride  40 mEq Oral BID  . pregabalin  225 mg Oral BID  . rosuvastatin  20 mg Oral Daily  . sertraline  150 mg Oral Daily   Continuous Infusions: . methocarbamol (ROBAXIN) IV    . sodium chloride       LOS: 6 days     Georgette Shell, MD [facility]  If 7PM-7AM, please contact night-coverage [email or web address] Password [facility] 12/30/2018, 10:58 AM

## 2018-12-30 NOTE — TOC Initial Note (Signed)
Transition of Care [facility]) - Initial/Assessment Note    Patient Details  Name: [name] MRN: [id number] Date of Birth: Nov 14, 1965  Transition of Care [facility]) CM/SW Contact:    Eileen Stanford, LCSW Phone Number: 12/30/2018, 10:40 AM  Clinical Narrative:   Pt is only alert to self and place. Pt's son states they are unable to care for pt at home. Pt lives with son and son's wife. Pt's son states pt was at [facility] before but states her insurance made her leave early. Pt's son is not sure if this is true. Pt's son states she really needs a long term place. Pt's son states she is hard to handle at home. Pt's son is agreeable for CSW to send referral to [facility] and [place] (memory care).                 Expected Discharge Plan: Skilled Nursing Facility Barriers to Discharge: Continued Medical Work up   Patient Goals and CMS Choice Patient states their goals for this hospitalization and ongoing recovery are:: "to go to rehab"   Choice offered to / list presented to : Adult Children  Expected Discharge Plan and Services Expected Discharge Plan: [place] In-house Referral: Clinical Social Work   Post Acute Care Choice: [place] Living arrangements for the past 2 months: [place]                                      Prior Living Arrangements/Services Living arrangements for the past 2 months: Single Family Home Lives with:: Adult Children Patient language and need for interpreter reviewed:: Yes Do you feel safe going back to the place where you live?: Yes      Need for Family Participation in Patient Care: Yes (Comment) Care giver support system in place?: Yes (comment) Current home services: DME Criminal Activity/Legal Involvement Pertinent to Current Situation/Hospitalization: No - Comment as needed  Activities of Daily Living Home Assistive Devices/Equipment: Prosthesis, Wheelchair ADL  Screening (condition at time of admission) Patient's cognitive ability adequate to safely complete daily activities?: Yes Is the patient deaf or have difficulty hearing?: No Does the patient have difficulty seeing, even when wearing glasses/contacts?: Yes Does the patient have difficulty concentrating, remembering, or making decisions?: Yes Patient able to express need for assistance with ADLs?: Yes Does the patient have difficulty dressing or bathing?: No Independently performs ADLs?: Yes (appropriate for developmental age) Does the patient have difficulty walking or climbing stairs?: Yes Weakness of Legs: None Weakness of Arms/Hands: None  Permission Sought/Granted Permission sought to share information with : Family Supports    Share Information with NAME: Abe People  Permission granted to share info w AGENCY: [facility], [place] granted to share info w Relationship: son     Emotional Assessment Appearance:: Appears stated age Attitude/Demeanor/Rapport: Unable to Assess Affect (typically observed): Unable to Assess Orientation: : Oriented to Place, Oriented to Self Alcohol / Substance Use: Not Applicable Psych Involvement: No (comment)  Admission diagnosis:  [place] [W19.XXXA] Closed fracture of right hip, initial encounter ([place]) [S72.001A] Closed left hip fracture, initial encounter [facility]) [S72.002A] Patient Active Problem List   Diagnosis Date Noted  . Pressure injury of skin 12/26/2018  . Displaced intertrochanteric fracture of right femur, initial encounter for closed fracture ([place]) 12/24/2018  . Closed right ankle fracture 07/02/2018  . Anemia of  chronic disease 07/02/2018  . Displaced bimalleolar fracture of right ankle, closed, initial encounter 06/02/2018  . Avascular necrosis of bone of left hip ([place]) 04/03/2018  . Mucopurulent chronic bronchitis ([place]) 03/11/2018  . Allergic rhinitis 03/11/2018  . Anxiety and depression 12/29/2017  . Acute  respiratory failure with hypoxia ([place]) 12/28/2017  . Syncope 08/29/2017  . DKA (diabetic ketoacidoses) ([place]) 03/02/2017  . COPD exacerbation ([place]) 02/23/2017  . Acute on chronic respiratory failure with hypoxia and hypercapnia ([place]) 08/19/2016  . Leukocytosis 08/19/2016  . CKD (chronic kidney disease), stage III ([place]) 08/19/2016  . Altered mental status 08/19/2016  . Asthma exacerbation 06/18/2016  . Insomnia 06/12/2016  . Difficult intravenous access   . Acute on chronic respiratory failure with hypoxia ([place]) 05/19/2016  . NSTEMI (non-ST elevated myocardial infarction) ([place]) 05/19/2016  . Acute metabolic encephalopathy [id number]  . Hyperlipidemia   . Class 3 obesity due to excess calories with serious comorbidity and body mass index (BMI) of 40.0 to 44.9 in adult   . Obesity hypoventilation syndrome ([place])   . Diastolic dysfunction 99991111  . Diabetic neuropathy ([place]) 11/10/2014  . Fracture of distal femur ([place]) 07/08/2014  . Osteoporosis 07/08/2014  . Femur fracture, left ([place]) 07/08/2014  . Colles' fracture of left radius   . Acute kidney injury superimposed on CKD ([place]) 05/03/2014  . Left hip pain 05/03/2014  . Perimenopausal symptoms 02/13/2012  . Hyponatremia 12/29/2011  . Asthma exacerbation attacks 12/28/2011  . Bronchitis 12/28/2011  . Repeated falls 07/30/2011  . Hip fracture, left ([place]) 07/16/2011  . Femoral neck fracture ([place]) 07/16/2011  . Tobacco abuse 04/14/2010  . Hyperlipidemia associated with type 2 diabetes mellitus ([place]) 12/23/2008  . COPD (chronic obstructive pulmonary disease) ([place]) 11/29/2008  . THRUSH 05/20/2008  . Human immunodeficiency virus (HIV) disease ([place]) 04/19/2008  . Diabetes mellitus type 2 with complications, uncontrolled ([place]) 04/19/2008  . Mood disorder ([place]) 04/19/2008  . Hereditary and idiopathic peripheral neuropathy 04/19/2008  . Hypertension associated with diabetes ([place]) 04/19/2008   PCP:  [name], PA-C Pharmacy:    [facility] [place], [address] [place] [address] Phone: [phone] Fax: [phone]     Social Determinants of Health (SDOH) Interventions    Readmission Risk Interventions Readmission Risk Prevention Plan 06/17/2018  Transportation Screening Complete  Medication Review ([place]) Complete  PCP or Specialist appointment within 3-5 days of discharge Complete  HRI or Home Care Consult Complete  SW Recovery Care/Counseling Consult Complete  Palliative Care Screening Not [place] Complete  Some recent data might be hidden

## 2018-12-30 NOTE — TOC Progression Note (Addendum)
Transition of Care [facility]) - Progression Note    Patient Details  Name: [name] MRN: [id number] Date of Birth: November 20, 1965  Transition of Care [facility]) CM/SW Contact  Eileen Stanford, LCSW Phone Number: 12/30/2018, 12:16 PM  Clinical Narrative:   Josem Kaufmann started for [place]  2:[facility] called back and states they can no longer take pt stating they can not accomodate her needs. CSW faxed pt referral out further.  Expected Discharge Plan: [place] Barriers to Discharge: Continued Medical Work up  Expected Discharge Plan and Services Expected Discharge Plan: [place] In-house Referral: Clinical Social Work   Post Acute Care Choice: [place] Living arrangements for the past 2 months: [place] Determinants of Health (SDOH) Interventions    Readmission Risk Interventions Readmission Risk Prevention Plan 06/17/2018  Transportation Screening Complete  Medication Review Press photographer) Complete  PCP or Specialist appointment within 3-5 days of discharge Complete  HRI or Home Care Consult Complete  SW Recovery Care/Counseling Consult Complete  Palliative Care Screening Not [place] Complete  Some recent data might be hidden

## 2018-12-30 NOTE — NC FL2 (Signed)
[place] MEDICAID FL2 LEVEL OF CARE SCREENING TOOL     IDENTIFICATION  Patient Name: [name] Birthdate: 26-Feb-1965 Sex: female Admission Date (Current Location): 12/24/2018  [facility] and [place] Number:  Herbalist and Address:  [facility] [place]. [facility] [place] [address], [place], [place] [address]      Provider Number: [id number]  Attending Physician Name and Address:  Georgette Shell, MD  Relative Name and Phone Number:       Current Level of Care: Hospital Recommended Level of Care: [place] Prior Approval Number:    Date Approved/Denied:   PASRR Number: [id number]  Discharge Plan: SNF    Current Diagnoses: Patient Active Problem List   Diagnosis Date Noted  . Pressure injury of skin 12/26/2018  . Displaced intertrochanteric fracture of right femur, initial encounter for closed fracture ([place]) 12/24/2018  . Closed right ankle fracture 07/02/2018  . Anemia of chronic disease 07/02/2018  . Displaced bimalleolar fracture of right ankle, closed, initial encounter 06/02/2018  . Avascular necrosis of bone of left hip ([place]) 04/03/2018  . Mucopurulent chronic bronchitis ([place]) 03/11/2018  . Allergic rhinitis 03/11/2018  . Anxiety and depression 12/29/2017  . Acute respiratory failure with hypoxia ([place]) 12/28/2017  . Syncope 08/29/2017  . DKA (diabetic ketoacidoses) ([place]) 03/02/2017  . COPD exacerbation ([place]) 02/23/2017  . Acute on chronic respiratory failure with hypoxia and hypercapnia ([place]) 08/19/2016  . Leukocytosis 08/19/2016  . CKD (chronic kidney disease), stage III ([place]) 08/19/2016  . Altered mental status 08/19/2016  . Asthma exacerbation 06/18/2016  . Insomnia 06/12/2016  . Difficult intravenous access   . Acute on chronic respiratory failure with hypoxia ([place]) 05/19/2016  . NSTEMI (non-ST elevated myocardial infarction) ([place]) 05/19/2016  . Acute metabolic encephalopathy A999333  . Hyperlipidemia   .  Class 3 obesity due to excess calories with serious comorbidity and body mass index (BMI) of 40.0 to 44.9 in adult   . Obesity hypoventilation syndrome ([place])   . Diastolic dysfunction 99991111  . Diabetic neuropathy ([place]) 11/10/2014  . Fracture of distal femur ([place]) 07/08/2014  . Osteoporosis 07/08/2014  . Femur fracture, left ([place]) 07/08/2014  . Colles' fracture of left radius   . Acute kidney injury superimposed on CKD ([place]) 05/03/2014  . Left hip pain 05/03/2014  . Perimenopausal symptoms 02/13/2012  . Hyponatremia 12/29/2011  . Asthma exacerbation attacks 12/28/2011  . Bronchitis 12/28/2011  . Repeated falls 07/30/2011  . Hip fracture, left ([place]) 07/16/2011  . Femoral neck fracture ([place]) 07/16/2011  . Tobacco abuse 04/14/2010  . Hyperlipidemia associated with type 2 diabetes mellitus ([place]) 12/23/2008  . COPD (chronic obstructive pulmonary disease) ([place]) 11/29/2008  . THRUSH 05/20/2008  . Human immunodeficiency virus (HIV) disease ([place]) 04/19/2008  . Diabetes mellitus type 2 with complications, uncontrolled ([place]) 04/19/2008  . Mood disorder ([place]) 04/19/2008  . Hereditary and idiopathic peripheral neuropathy 04/19/2008  . Hypertension associated with diabetes ([place]) 04/19/2008    Orientation RESPIRATION BLADDER Height & Weight     Self, Place  O2(Nasal Cannul 4.5L) Incontinent Weight: 220 lb 0.3 oz (99.8 kg) Height:  5\' 10"  (177.8 cm)  BEHAVIORAL SYMPTOMS/MOOD NEUROLOGICAL BOWEL NUTRITION STATUS      Incontinent Diet  AMBULATORY STATUS COMMUNICATION OF NEEDS Skin   Extensive Assist Verbally PU Stage and Appropriate Care(closed incision right leg, foam)   PU Stage 2 Dressing: (located on heel, foam dressing)  Personal Care Assistance Level of Assistance  Bathing, Feeding, Dressing Bathing Assistance: Maximum assistance Feeding assistance: Independent Dressing Assistance: Maximum assistance     Functional Limitations Info  Sight, Hearing, Speech  Sight Info: Adequate Hearing Info: Adequate Speech Info: Adequate    SPECIAL CARE FACTORS FREQUENCY  PT (By licensed PT), OT (By licensed OT)     PT Frequency: 5x OT Frequency: 5x            Contractures Contractures Info: Not present    Additional Factors Info  Code Status, Allergies Code Status Info: Full Code Allergies Info: Cortizone-10 (Hydrocortisone)           Current Medications (12/30/2018):  This is the current hospital active medication list Current Facility-Administered Medications  Medication Dose Route Frequency Provider Last Rate Last Admin  . acetaminophen (TYLENOL) tablet 325-650 mg  325-650 mg Oral Q6H PRN Leandrew Koyanagi, MD   500 mg at 12/28/18 2146  . albuterol (PROVENTIL) (2.5 MG/3ML) 0.083% nebulizer solution 2.5 mL  2.5 mL Nebulization Q6H PRN Leandrew Koyanagi, MD   2.5 mL at 12/26/18 0432  . alum & mag hydroxide-simeth (MAALOX/MYLANTA) 200-200-20 MG/5ML suspension 30 mL  30 mL Oral Q4H PRN Leandrew Koyanagi, MD      . amoxicillin-clavulanate (AUGMENTIN) 875-125 MG per tablet 1 tablet  1 tablet Oral Q12H Kyle, Tyrone A, DO   1 tablet at 12/29/18 2151  . bictegravir-emtricitabine-tenofovir AF (BIKTARVY) 50-200-25 MG per tablet 1 tablet  1 tablet Oral Daily Leandrew Koyanagi, MD   1 tablet at 12/29/18 1100  . docusate sodium (COLACE) capsule 100 mg  100 mg Oral BID Leandrew Koyanagi, MD   100 mg at 12/27/18 1018  . enoxaparin (LOVENOX) injection 40 mg  40 mg Subcutaneous Q24H Leandrew Koyanagi, MD   40 mg at 12/29/18 1715  . feeding supplement (ENSURE ENLIVE) (ENSURE ENLIVE) liquid 237 mL  237 mL Oral QHS Leandrew Koyanagi, MD   237 mL at 12/29/18 2150  . fluticasone (FLONASE) 50 MCG/ACT nasal spray 1 spray  1 spray Each Nare Daily Leandrew Koyanagi, MD   1 spray at 12/29/18 1033  . fluticasone furoate-vilanterol (BREO ELLIPTA) 100-25 MCG/INH 1 puff  1 puff Inhalation Daily Leandrew Koyanagi, MD   Stopped at 12/30/18 [phone]   And  . umeclidinium bromide (INCRUSE ELLIPTA) 62.5 MCG/INH 1  puff  1 puff Inhalation Daily Leandrew Koyanagi, MD   Stopped at 12/30/18 [phone]  . HYDROcodone-acetaminophen (NORCO) 7.5-325 MG per tablet 1-2 tablet  1-2 tablet Oral Q4H PRN Leandrew Koyanagi, MD   1 tablet at 12/27/18 1142  . HYDROcodone-acetaminophen (NORCO/VICODIN) 5-325 MG per tablet 1-2 tablet  1-2 tablet Oral Q4H PRN Leandrew Koyanagi, MD   2 tablet at 12/30/18 0302  . ibuprofen (ADVIL) tablet 600 mg  600 mg Oral Q8H PRN Marylyn Ishihara, Tyrone A, DO      . insulin aspart (novoLOG) injection 0-5 Units  0-5 Units Subcutaneous QHS Leandrew Koyanagi, MD      . insulin aspart (novoLOG) injection 0-9 Units  0-9 Units Subcutaneous TID WC Leandrew Koyanagi, MD   1 Units at 12/29/18 1230  . insulin glargine (LANTUS) injection 20 Units  20 Units Subcutaneous BID Cherylann Ratel A, DO   20 Units at 12/29/18 2157  . ipratropium-albuterol (DUONEB) 0.5-2.5 (3) MG/3ML nebulizer solution 3 mL  3 mL Nebulization Q6H PRN Leandrew Koyanagi, MD      . loperamide (IMODIUM) capsule 4  mg  4 mg Oral PRN Marylyn Ishihara, Tyrone A, DO   4 mg at 12/29/18 1507  . magnesium citrate solution 1 Bottle  1 Bottle Oral Once PRN Leandrew Koyanagi, MD      . menthol-cetylpyridinium (CEPACOL) lozenge 3 mg  1 lozenge Oral PRN Leandrew Koyanagi, MD       Or  . phenol (CHLORASEPTIC) mouth spray 1 spray  1 spray Mouth/Throat PRN Leandrew Koyanagi, MD      . methocarbamol (ROBAXIN) tablet 500 mg  500 mg Oral Q6H PRN Leandrew Koyanagi, MD       Or  . methocarbamol (ROBAXIN) 500 mg in dextrose 5 % 50 mL IVPB  500 mg Intravenous Q6H PRN Leandrew Koyanagi, MD      . morphine 2 MG/ML injection 1 mg  1 mg Intravenous Q2H PRN Leandrew Koyanagi, MD      . multivitamin with minerals tablet 1 tablet  1 tablet Oral Daily Leandrew Koyanagi, MD   1 tablet at 12/29/18 2150  . naloxone [facility]) injection 0.4 mg  0.4 mg Intravenous PRN Marylyn Ishihara, Tyrone A, DO      . ondansetron (ZOFRAN) injection 4 mg  4 mg Intravenous Q6H PRN Leandrew Koyanagi, MD   4 mg at 12/24/18 1600  . ondansetron (ZOFRAN) tablet 4 mg  4 mg Oral Q6H PRN Leandrew Koyanagi, MD       Or  . ondansetron [facility]) injection 4 mg  4 mg Intravenous Q6H PRN Leandrew Koyanagi, MD      . pantoprazole (PROTONIX) EC tablet 40 mg  40 mg Oral Daily Leandrew Koyanagi, MD   40 mg at 12/29/18 1035  . polyethylene glycol (MIRALAX / GLYCOLAX) packet 17 g  17 g Oral Daily PRN Leandrew Koyanagi, MD      . potassium chloride SA (KLOR-CON) CR tablet 40 mEq  40 mEq Oral BID Marylyn Ishihara, Tyrone A, DO   40 mEq at 12/29/18 2150  . pregabalin (LYRICA) capsule 225 mg  225 mg Oral BID Leandrew Koyanagi, MD   225 mg at 12/29/18 2150  . rosuvastatin (CRESTOR) tablet 20 mg  20 mg Oral Daily Leandrew Koyanagi, MD   20 mg at 12/29/18 1034  . sertraline (ZOLOFT) tablet 150 mg  150 mg Oral Daily Leandrew Koyanagi, MD   150 mg at 12/29/18 1033  . sodium chloride 0.9 % bolus 500 mL  500 mL Intravenous Once Kirby-Graham, Karsten Fells, NP      . sorbitol 70 % solution 30 mL  30 mL Oral Daily PRN Leandrew Koyanagi, MD         Discharge Medications: Please see discharge summary for a list of discharge medications.  Relevant Imaging Results:  Relevant Lab Results:   Additional Information SSN:[id number]  Gerrianne Scale Holle Sprick, LCSW

## 2018-12-31 LAB — GLUCOSE, CAPILLARY
Glucose-Capillary: 151 mg/dL — ABNORMAL HIGH (ref 70–99)
Glucose-Capillary: 115 mg/dL — ABNORMAL HIGH (ref 70–99)
Glucose-Capillary: 101 mg/dL — ABNORMAL HIGH (ref 70–99)
Glucose-Capillary: 100 mg/dL — ABNORMAL HIGH (ref 70–99)
Glucose-Capillary: 90 mg/dL (ref 70–99)

## 2018-12-31 LAB — TYPE AND SCREEN
ABO/RH(D): O NEG
Antibody Screen: NEGATIVE
Unit division: 0

## 2018-12-31 LAB — SARS CORONAVIRUS 2 (TAT 6-24 HRS): SARS Coronavirus 2: NEGATIVE

## 2018-12-31 LAB — CBC WITH DIFFERENTIAL/PLATELET
Abs Immature Granulocytes: 0.07 10*3/uL (ref 0.00–0.07)
Basophils Absolute: 0.1 10*3/uL (ref 0.0–0.1)
Basophils Relative: 1 %
Eosinophils Absolute: 0.9 10*3/uL — ABNORMAL HIGH (ref 0.0–0.5)
Eosinophils Relative: 9 %
HCT: 28.7 % — ABNORMAL LOW (ref 36.0–46.0)
Hemoglobin: 8.7 g/dL — ABNORMAL LOW (ref 12.0–15.0)
Immature Granulocytes: 1 %
Lymphocytes Relative: 18 %
Lymphs Abs: 1.9 10*3/uL (ref 0.7–4.0)
MCH: 24.7 pg — ABNORMAL LOW (ref 26.0–34.0)
MCHC: 30.3 g/dL (ref 30.0–36.0)
MCV: 81.5 fL (ref 80.0–100.0)
Monocytes Absolute: 1.2 10*3/uL — ABNORMAL HIGH (ref 0.1–1.0)
Monocytes Relative: 12 %
Neutro Abs: 6.4 10*3/uL (ref 1.7–7.7)
Neutrophils Relative %: 59 %
Platelets: 379 10*3/uL (ref 150–400)
RBC: 3.52 MIL/uL — ABNORMAL LOW (ref 3.87–5.11)
RDW: 20.6 % — ABNORMAL HIGH (ref 11.5–15.5)
WBC: 10.6 10*3/uL — ABNORMAL HIGH (ref 4.0–10.5)
nRBC: 0 % (ref 0.0–0.2)

## 2018-12-31 LAB — BASIC METABOLIC PANEL
Anion gap: 5 (ref 5–15)
BUN: 11 mg/dL (ref 6–20)
CO2: 33 mmol/L — ABNORMAL HIGH (ref 22–32)
Calcium: 8.4 mg/dL — ABNORMAL LOW (ref 8.9–10.3)
Chloride: 98 mmol/L (ref 98–111)
Creatinine, Ser: 0.75 mg/dL (ref 0.44–1.00)
GFR calc Af Amer: >60 mL/min (ref >60)
GFR calc non Af Amer: >60 mL/min (ref >60)
Glucose, Bld: 117 mg/dL — ABNORMAL HIGH (ref 70–99)
Potassium: 6.6 mmol/L (ref 3.5–5.1)
Sodium: 136 mmol/L (ref 135–145)

## 2018-12-31 LAB — BPAM RBC
Blood Product Expiration Date: 202101142359
ISSUE DATE / TIME: 202012291529
Unit Type and Rh: 9500

## 2018-12-31 LAB — MAGNESIUM: Magnesium: 1.8 mg/dL (ref 1.7–2.4)

## 2018-12-31 MED ORDER — INSULIN GLARGINE 100 UNIT/ML ~~LOC~~ SOLN
10.0000 [IU] | Freq: Every day | SUBCUTANEOUS | Status: DC
  Administered 2019-01-01 – 2019-01-05 (×5): 10 [IU] via SUBCUTANEOUS
  Filled 2018-12-31 (×7): qty 0.1

## 2018-12-31 MED ORDER — SODIUM ZIRCONIUM CYCLOSILICATE 10 G PO PACK
10.0000 g | PACK | Freq: Two times a day (BID) | ORAL | Status: AC
  Administered 2018-12-31 – 2019-01-01 (×2): 10 g via ORAL
  Filled 2018-12-31 (×2): qty 1

## 2018-12-31 NOTE — TOC Progression Note (Signed)
Transition of Care [facility]) - Progression Note    Patient Details  Name: [name] MRN: [id number] Date of Birth: [birth year]  Transition of [facility]) CM/SW [place], LCSW Phone Number: 12/31/2018, 1:41 PM  Clinical Narrative:    Only bed offer at this time is [place] and they are full. Insurance Josem Kaufmann is still pending.   Expected Discharge Plan: [place] Barriers to Discharge: Continued Medical Work up  Expected Discharge Plan and Services Expected Discharge Plan: [place] In-house Referral: Clinical Social Work   Post Acute Care Choice: [place] Living arrangements for the past 2 months: [place] Determinants of Health (SDOH) Interventions    Readmission Risk Interventions Readmission Risk Prevention Plan 06/17/2018  Transportation Screening Complete  Medication Review Press photographer) Complete  PCP or Specialist appointment within 3-5 days of discharge Complete  HRI or Home Care Consult Complete  SW Recovery Care/Counseling Consult Complete  Palliative Care Screening Not [place] Complete  Some recent data might be hidden

## 2018-12-31 NOTE — Progress Notes (Signed)
Patient does not tolerate repositioning well. Pt states she's in too much pain and does not want to be repositioned for the remainder of the night, Educated thoroughly on skin breakdown and prevention. Pt verbalized understanding but refused, pt provided prn pain medication at this time.

## 2018-12-31 NOTE — Progress Notes (Addendum)
PROGRESS NOTE    [name]  [id number] DOB: 03/16/1965 DOA: 12/24/2018 PCP: [name], PA-C   Brief Narrative: 53 y.o.femalewith medical history significant ofasthma, COPD, stage IV CKD, morbid obesity, depression, anxiety, hypertension, insulin-dependent diabetes mellitus, left-sided DKA, diabetic neuropathy, GERD, HIV, hyperlipidemia was at home today and was trying to get up from the commode reports feeling dizzy and fell on her right side. She reports pain on the right hip. On arrival to ED she underwent x-rays of the right hip and was found to have a Possible left pelvic bone fractures. Probable intertrochanteric fracture of the proximal right femur. As per the patient, she denied hitting her head or syncope. She reports she was trying to get up from the commode after BM, felt dizzy and fell on the right side. She denies any chest pain or sob, fever, chills, nausea, vomiting , abdominal pain, headache, tingling or numbness, diarrhea, dysuria, hematochezia, or hematemesis. She denies any blurry vision at this time.  Orthopedics consulted and she was referred to medical service for admission   Assessment & Plan:   Principal Problem:   Displaced intertrochanteric fracture of right femur, initial encounter for closed fracture ([place]) Active Problems:   Pressure injury of skin   Intertrochanteric fracture of the proximal right femur: - ortho onboard -s/pOpen treatment of intertrochanteric fracture with intramedullary implant - Pain control; watch narcotics and sedating meds d/t lethergy  Dizziness and fall Debility - Possibly secondary to hypotension. - echo: Left Ventricle: Left ventricular ejection fraction, by visual estimation, is 60 to 65%. The left ventricle has normal function. The left ventricle has no regional wall motion abnormalities. The left ventricular internal cavity size was the left  ventricle is normal in size. There is  mildly increased left ventricular hypertrophy. Left ventricular diastolic parameters were normal.Right Ventricle: The right ventricular size is mildly enlarged. No increase in right ventricular wall thickness. Global RV systolic function is has normal systolic function. - negative trp  Hypotension -blood pressure 120/61.  Continue to hold home BP meds.   IDDM complicated by diabetic neuropathy-blood glucose -decrease Lantus to nightly.  Patient not eating much as she refuses to eat. CBG (last 3)  Recent Labs    12/30/18 2114 12/31/18 0243 12/31/18 0751  GLUCAP 94 151* 115*  - SSI; monitor - A1c 7.1 - diabetic diet  Hyperlipidemia  - crestor  CKD IIIa - Baseline creatinine ranging between 1 to 1.4. currently its at0.73  Pyuria -UCxwith yeast -stop rocephin -Urine culture with candiduria.  Not treated.  HIV  - biktarvy  COPD and Asthma: - flonase, breo ellipta - PRN albuterol, duonebs - CXR with possible early infection -d/c rocephin, zithro; transition to augmentin ( end 12/29) - Bipap d/t lethergy, hypoxia  Tobacco abuse:  - Cessation counseling provided.  - Start her on nicotine patch.  Left rib fracture:  - Pain control and incentive spirometry.   Hypomagnesemia magnesium 1.7 replete Hypophosphatemia Hypokalemia - replete, monitor - add sodium phos, K+  Lethargy-she is more awake today. Patient needs CPAP at night.  CT head stable atrophy and white matter disease.  No acute intracranial abnormality.  Pressure Injury 12/26/18 Heel Right Stage 2 -  Partial thickness loss of dermis presenting as a shallow open injury with a red, pink wound bed without slough. Fluid Blister (Active)  12/26/18 0800  Location: Heel  Location Orientation: Right  Staging: Stage 2 -  Partial thickness loss of dermis presenting as a shallow open injury with a red,  pink wound bed without  slough.  Wound Description (Comments): Fluid Blister  Present on Admission:       Nutrition Problem: Increased nutrient needs Etiology: post-op healing     Signs/Symptoms: estimated needs    Interventions: MVI, Ensure Enlive (each supplement provides 350kcal and 20 grams of protein)  Estimated body mass index is 30.53 kg/m as calculated from the following:   Height as of this encounter: 5\' 10"  (1.778 m).   Weight as of this encounter: 96.5 kg.  DVT prophylaxis:lovenox Code Status:FULL Disposition Plan:Covid ordered today 12/31/2018.  Hopefully SNF in a.m. Consultants:  Ortho  Antimicrobials:  rocephin   Subjective: She is resting in bed in no acute distress Breakfast is sitting next to her She reports she has not been to eat she do not feel hungry  Objective: Vitals:   12/30/18 1928 12/30/18 1930 12/30/18 2010 12/31/18 0500  BP: (!) 106/50 (!) 106/50 (!) 106/50 120/61  Pulse:   71   Resp:  14 12 14   Temp: 98.5 F (36.9 C)  98.2 F (36.8 C) 98 F (36.7 C)  TempSrc: Oral  Oral Oral  SpO2:   100% 99%  Weight:    96.5 kg  Height:        Intake/Output Summary (Last 24 hours) at 12/31/2018 1048 Last data filed at 12/31/2018 0500 Gross per 24 hour  Intake 1397 ml  Output 3050 ml  Net -1653 ml   Filed Weights   12/29/18 0455 12/30/18 0452 12/31/18 0500  Weight: 100 kg 99.8 kg 96.5 kg    Examination:  General exam: Appears calm and comfortable  Respiratory system: Clear to auscultation. Respiratory effort normal. Cardiovascular system: S1 & S2 heard, RRR. No JVD, murmurs, rubs, gallops or clicks. No pedal edema. Gastrointestinal system: Abdomen is nondistended, soft and nontender. No organomegaly or masses felt. Normal bowel sounds heard. Central nervous system: Alert and oriented. No focal neurological deficits. Extremities: Left below-knee amputation, right hip dressing in place. skin: No rashes, lesions or ulcers Psychiatry: Judgement  and insight appear normal. Mood & affect appropriate.     Data Reviewed: I have personally reviewed following labs and imaging studies  CBC: Recent Labs  Lab 12/26/18 0403 12/27/18 0439 12/28/18 0319 12/29/18 0329 12/30/18 0345 12/31/18 0406  WBC 11.9* 10.3 9.8 9.4 9.4 10.6*  NEUTROABS 8.6*  --  6.4 5.7 5.4 6.4  HGB 9.2* 8.0* 7.3* 7.0* 7.3* 8.7*  HCT 30.3* 26.8* 25.3* 23.6* 24.7* 28.7*  MCV 80.2 81.7 84.1 81.7 82.3 81.5  PLT 257 202 248 308 338 [id number]   Basic Metabolic Panel: Recent Labs  Lab 12/25/18 1106 12/26/18 0403 12/27/18 0439 12/28/18 0319 12/29/18 0329 12/30/18 0345  NA 138 137 142 141 140 140  K 3.7 3.7 4.0 3.6 3.4* 4.8  CL 101 102 108 107 104 103  CO2 26 25 25 28 28 28   GLUCOSE 90 139* 146* 101* 66* 104*  BUN 13 12 16 12 10 10   CREATININE 1.44* 1.21* 1.02* 0.74 0.73 0.71  CALCIUM 8.0* 7.7* 7.5* 7.8* 7.7* 8.1*  MG 1.2* 1.5*  --  1.6* 1.7 1.5*  PHOS 4.1 2.9  --  2.1* 1.7* 2.3*   GFR: Estimated Creatinine Clearance: 101.1 mL/min (by C-G formula based on SCr of 0.71 mg/dL). Liver Function Tests: Recent Labs  Lab 12/24/18 1300 12/26/18 0403 12/26/18 0827 12/28/18 0319 12/29/18 0329 12/30/18 0345  AST 21  --  19  --   --   --   ALT 15  --  9  --   --   --   ALKPHOS 187*  --  124  --   --   --   BILITOT 1.0  --  0.2*  --   --   --   PROT 7.0  --  5.6*  --   --   --   ALBUMIN 2.4* 1.8* 1.7* 1.5* 1.4* 1.4*   No results for input(s): LIPASE, AMYLASE in the last 168 hours. Recent Labs  Lab 12/26/18 0827 12/28/18 0319  AMMONIA 42* 37*   Coagulation Profile: No results for input(s): INR, PROTIME in the last 168 hours. Cardiac Enzymes: No results for input(s): CKTOTAL, CKMB, CKMBINDEX, TROPONINI in the last 168 hours. BNP (last 3 results) No results for input(s): PROBNP in the last 8760 hours. HbA1C: No results for input(s): HGBA1C in the last 72 hours. CBG: Recent Labs  Lab 12/30/18 1127 12/30/18 1640 12/30/18 2114 12/31/18 0243  12/31/18 0751  GLUCAP 77 108* 94 151* 115*   Lipid Profile: No results for input(s): CHOL, HDL, LDLCALC, TRIG, CHOLHDL, LDLDIRECT in the last 72 hours. Thyroid Function Tests: No results for input(s): TSH, T4TOTAL, FREET4, T3FREE, THYROIDAB in the last 72 hours. Anemia Panel: No results for input(s): VITAMINB12, FOLATE, FERRITIN, TIBC, IRON, RETICCTPCT in the last 72 hours. Sepsis Labs: Recent Labs  Lab 12/24/18 1300 12/24/18 1540 12/26/18 0526 12/26/18 0827  LATICACIDVEN 1.7 0.9 1.2 0.8    Recent Results (from the past 240 hour(s))  Blood culture (routine x 2)     Status: None   Collection Time: 12/24/18 12:39 PM   Specimen: BLOOD  Result Value Ref Range Status   Specimen Description BLOOD SITE NOT SPECIFIED  Final   Special Requests   Final    BOTTLES DRAWN AEROBIC AND ANAEROBIC Blood Culture adequate volume   Culture   Final    NO GROWTH 5 DAYS Performed at [facility], [address], [place], [place] [address]    Report Status 12/29/2018 FINAL  Final  Blood culture (routine x 2)     Status: None   Collection Time: 12/24/18  1:37 PM   Specimen: BLOOD  Result Value Ref Range Status   Specimen Description BLOOD SITE NOT SPECIFIED  Final   Special Requests   Final    BOTTLES DRAWN AEROBIC AND ANAEROBIC Blood Culture adequate volume   Culture   Final    NO GROWTH 5 DAYS Performed at [facility], [place] [address] [place], [place] [address]    Report Status 12/29/2018 FINAL  Final  SARS CORONAVIRUS 2 (TAT 6-24 HRS) Nasopharyngeal Nasopharyngeal Swab     Status: None   Collection Time: 12/24/18  2:21 PM   Specimen: Nasopharyngeal Swab  Result Value Ref Range Status   SARS Coronavirus 2 NEGATIVE NEGATIVE Final    Comment: (NOTE) SARS-CoV-2 target nucleic acids are NOT DETECTED. The SARS-CoV-2 RNA is generally detectable in upper and lower respiratory specimens during the acute phase of infection. Negative results do not preclude SARS-CoV-2 infection, do  not rule out co-infections with other pathogens, and should not be used as the sole basis for treatment or other patient management decisions. Negative results must be combined with clinical observations, patient history, and epidemiological information. The expected result is Negative. Fact Sheet for Patients: SugarRoll.be Fact Sheet for Healthcare Providers: https://www.woods-.com/ This test is not yet approved or cleared by the [place] FDA and  has been authorized for detection and/or diagnosis of SARS-CoV-2 by FDA under  an Emergency Use Authorization (EUA). This EUA will remain  in effect (meaning this test can be used) for the duration of the COVID-19 declaration under Section 56 4(b)(1) of the Act, 21 U.S.C. section 360bbb-3(b)(1), unless the authorization is terminated or revoked sooner. Performed at [facility], [place] [address] [place], [place] [address]   Surgical pcr screen     Status: None   Collection Time: 12/24/18 10:13 PM   Specimen: Nasal Mucosa; Nasal Swab  Result Value Ref Range Status   MRSA, PCR NEGATIVE NEGATIVE Final   Staphylococcus aureus NEGATIVE NEGATIVE Final    Comment: (NOTE) The Xpert SA Assay (FDA approved for NASAL specimens in patients 66 years of age and older), is one component of a comprehensive surveillance program. It is not intended to diagnose infection nor to guide or monitor treatment. Performed at [facility], [place] [address] [place], [place] [address]   Culture, Urine     Status: Abnormal   Collection Time: 12/25/18 10:30 PM   Specimen: Urine, Catheterized  Result Value Ref Range Status   Specimen Description URINE, CATHETERIZED  Final   Special Requests   Final    NONE Performed at [facility], [place] [address], [place], [place] [address]    Culture >=100,000 COLONIES/mL YEAST (A)  Final   Report Status 12/27/2018 FINAL  Final         Radiology  Studies: No results found.      Scheduled Meds: . bictegravir-emtricitabine-tenofovir AF  1 tablet Oral Daily  . docusate sodium  100 mg Oral BID  . enoxaparin (LOVENOX) injection  40 mg Subcutaneous Q24H  . feeding supplement (ENSURE ENLIVE)  237 mL Oral QHS  . fluticasone  1 spray Each Nare Daily  . fluticasone furoate-vilanterol  1 puff Inhalation Daily   And  . umeclidinium bromide  1 puff Inhalation Daily  . insulin aspart  0-5 Units Subcutaneous QHS  . insulin aspart  0-9 Units Subcutaneous TID WC  . insulin glargine  20 Units Subcutaneous BID  . multivitamin with minerals  1 tablet Oral Daily  . pantoprazole  40 mg Oral Daily  . potassium chloride  40 mEq Oral BID  . pregabalin  225 mg Oral BID  . rosuvastatin  20 mg Oral Daily  . sertraline  150 mg Oral Daily   Continuous Infusions: . methocarbamol (ROBAXIN) IV    . sodium chloride       LOS: 7 days    Georgette Shell, MD [facility]  If 7PM-7AM, please contact night-coverage [email or web address] Password [email or web address] 12/31/2018, 10:48 AM

## 2018-12-31 NOTE — Progress Notes (Signed)
CRITICAL VALUE ALERT  Critical Value:  POTASSIUM=6.6  Date & Time Notied:  12/31/2018 AT 1514  Provider Notified:  DR MATHEWS  Orders Received/Actions taken:

## 2018-12-31 NOTE — Progress Notes (Signed)
Physical Therapy Treatment Patient Details Name: [name] MRN: [id number] DOB: 1965-03-05 Today's Date: 12/31/2018    History of Present Illness [name] is a 53 y.o. female with medical history significant for but not limited to asthma, COPD, stage IV CKD, morbid obesity, depression, anxiety, hypertension, insulin-dependent diabetes mellitus, left-sided BKA, diabetic neuropathy, GERD, HIV, hyperlipidemia who fell from the commode at home.  Found to have Intertrochanteric fracture of the proximal right femur and now s/p IM nail. Post-op was lethargic and placed on Bi-pap and moved to a progressive care unit.     PT Comments    Patient received in bed, initially attempting to refuse therapy (stating, "I'm not in the mood for it") but able to be convinced to participate in low level session following education on benefits of PT/mobility. Shows some improvement in cognition and ability to initiate movements, able to get LEs off of bed then refused to allow PT/Tech to boost her up to upright due to pain. Returned to bed with MaxAx2, rolled with MaxA and found to be completely soiled. She was positioned to comfort, RN and NT alerted regarding patient status and need for bath/full bed change out. She was left in bed with all needs met, bed alarm active.    Follow Up Recommendations  Supervision for mobility/OOB;Supervision/Assistance - 24 hour;SNF     Equipment Recommendations  None recommended by PT    Recommendations for Other Services       Precautions / Restrictions Precautions Precautions: Fall Required Braces or Orthoses: Other Brace Other Brace: L LE prosthesis Restrictions Weight Bearing Restrictions: Yes RLE Weight Bearing: Weight bearing as tolerated    Mobility  Bed Mobility Overal bed mobility: Needs Assistance Bed Mobility: Supine to Sit;Rolling;Sit to Supine Rolling: Max assist   Supine to sit: Total assist;+2 for physical assistance Sit to supine:  Max assist;+2 for physical assistance   General bed mobility comments: attempted supine to sit, patient often stopping and resting and very talkative, seeming to distract herself from task at hand; got legs off of bed then refused to allow PT/tech to assist her up to full sitting position due to pain  Transfers                 General transfer comment: deferred- pain  Ambulation/Gait             General Gait Details: deferred- pain   Stairs             Wheelchair Mobility    Modified Rankin (Stroke Patients Only)       Balance Overall balance assessment: Needs assistance   Sitting balance-Leahy Scale: Fair                                      Cognition Arousal/Alertness: Awake/alert Behavior During Therapy: Anxious Overall Cognitive Status: No family/caregiver present to determine baseline cognitive functioning Area of Impairment: Orientation;Attention;Memory;Safety/judgement;Awareness;Following commands;Problem solving                 Orientation Level: Disoriented to;Situation Current Attention Level: Selective Memory: Decreased short-term memory;Decreased recall of precautions Following Commands: Follows one step commands with increased time;Follows one step commands consistently Safety/Judgement: Decreased awareness of safety;Decreased awareness of deficits Awareness: Intellectual Problem Solving: Slow processing;Decreased initiation General Comments: cognition and alertness/awareness improved today, however continues to make intermittent odd statements- at EOS, asked "why can't [name] clean me up?"  Exercises      General Comments General comments (skin integrity, edema, etc.): found to be completely soiled and in need of bath and full bed change- NT and nursing staff alerted      Pertinent Vitals/Pain Pain Assessment: Faces Faces Pain Scale: Hurts whole lot Pain Location: R LE with movement, back Pain Descriptors  / Indicators: Grimacing;Guarding Pain Intervention(s): Limited activity within patient's tolerance;Monitored during session;Premedicated before session    Home Living                      Prior Function            PT Goals (current goals can now be found in the care plan section) Acute Rehab PT Goals Patient Stated Goal: to be able to transfer with prosthesis PT Goal Formulation: Patient unable to participate in goal setting Time For Goal Achievement: 01/03/19 Potential to Achieve Goals: Good Progress towards PT goals: Not progressing toward goals - comment(limited by pain)    Frequency    Min 3X/week      PT Plan Current plan remains appropriate    Co-evaluation              AM-PAC PT "6 Clicks" Mobility   Outcome Measure  Help needed turning from your back to your side while in a flat bed without using bedrails?: A Lot Help needed moving from lying on your back to sitting on the side of a flat bed without using bedrails?: Total Help needed moving to and from a bed to a chair (including a wheelchair)?: Total Help needed standing up from a chair using your arms (e.g., wheelchair or bedside chair)?: Total Help needed to walk in hospital room?: Total Help needed climbing 3-5 steps with a railing? : Total 6 Click Score: 7    End of Session Equipment Utilized During Treatment: Oxygen Activity Tolerance: Patient limited by pain Patient left: in bed;with call bell/phone within reach;with bed alarm set Nurse Communication: Other (comment)(patient and bed completely soiled, needs bath and full bed change out) PT Visit Diagnosis: Unsteadiness on feet (R26.81)     Time: 2025-4270 PT Time Calculation (min) (ACUTE ONLY): 23 min  Charges:  $Therapeutic Activity: 8-22 mins $Self Care/Home Management: 8-22                     Windell Norfolk, DPT, PN1   Supplemental Physical Therapist [place]    Pager [phone] Acute Rehab Office [phone]

## 2019-01-01 LAB — GLUCOSE, CAPILLARY
Glucose-Capillary: 126 mg/dL — ABNORMAL HIGH (ref 70–99)
Glucose-Capillary: 148 mg/dL — ABNORMAL HIGH (ref 70–99)
Glucose-Capillary: 230 mg/dL — ABNORMAL HIGH (ref 70–99)
Glucose-Capillary: 181 mg/dL — ABNORMAL HIGH (ref 70–99)

## 2019-01-01 LAB — BASIC METABOLIC PANEL
Anion gap: 8 (ref 5–15)
BUN: 13 mg/dL (ref 6–20)
CO2: 31 mmol/L (ref 22–32)
Calcium: 8.6 mg/dL — ABNORMAL LOW (ref 8.9–10.3)
Chloride: 98 mmol/L (ref 98–111)
Creatinine, Ser: 0.75 mg/dL (ref 0.44–1.00)
GFR calc Af Amer: >60 mL/min (ref >60)
GFR calc non Af Amer: >60 mL/min (ref >60)
Glucose, Bld: 117 mg/dL — ABNORMAL HIGH (ref 70–99)
Potassium: 5.5 mmol/L — ABNORMAL HIGH (ref 3.5–5.1)
Sodium: 137 mmol/L (ref 135–145)

## 2019-01-01 MED ORDER — ENSURE ENLIVE PO LIQD
237.0000 mL | Freq: Two times a day (BID) | ORAL | Status: DC
  Administered 2019-01-01 – 2019-01-07 (×8): 237 mL via ORAL

## 2019-01-01 MED ORDER — JUVEN PO PACK
1.0000 | PACK | Freq: Two times a day (BID) | ORAL | Status: DC
  Administered 2019-01-01 – 2019-01-08 (×6): 1 via ORAL
  Filled 2019-01-01 (×10): qty 1

## 2019-01-01 NOTE — Progress Notes (Signed)
Physical Therapy Treatment Patient Details Name: Philamena I Polack MRN: [id number] DOB: 05/15/1965 Today's Date: 01/01/2019    History of Present Illness Adrienna I [place] is a 53 y.o. female with medical history significant for but not limited to asthma, COPD, stage IV CKD, morbid obesity, depression, anxiety, hypertension, insulin-dependent diabetes mellitus, left-sided BKA, diabetic neuropathy, GERD, HIV, hyperlipidemia who fell from the commode at home.  Found to have Intertrochanteric fracture of the proximal right femur and now s/p IM nail. Post-op was lethargic and placed on Bi-pap and moved to a progressive care unit.     PT Comments    Patient received in bed, willing to participate in bed level exercise session today. Seemed to tolerate session well when allowed to move at her own pace and with her own timing and participated in ankle pumps, SLRs, hip abduction, mini-heel slides, and glute sets in bed. She was left in bed positioned to comfort with all needs met, bed alarm active. Due to poor tolerance to therapy interventions thus far (pain) and difficulty convincing patient to participate in EOB/OOB activities, adjusted frequency to 2x/week moving forward.     Follow Up Recommendations  Supervision for mobility/OOB;Supervision/Assistance - 24 hour;SNF     Equipment Recommendations  None recommended by PT(defer to next setting)    Recommendations for Other Services       Precautions / Restrictions Precautions Precautions: Fall Required Braces or Orthoses: Other Brace Other Brace: L LE prosthesis Restrictions Weight Bearing Restrictions: Yes RLE Weight Bearing: Weight bearing as tolerated    Mobility  Bed Mobility               General bed mobility comments: focus on bed level exercises  Transfers                 General transfer comment: focus on bed level exercises  Ambulation/Gait             General Gait Details: focus on bed level  exercises   Stairs             Wheelchair Mobility    Modified Rankin (Stroke Patients Only)       Balance                                            Cognition Arousal/Alertness: Awake/alert Behavior During Therapy: Anxious Overall Cognitive Status: No family/caregiver present to determine baseline cognitive functioning Area of Impairment: Orientation;Attention;Memory;Safety/judgement;Awareness;Following commands;Problem solving                 Orientation Level: Disoriented to;Situation;Place Current Attention Level: Selective Memory: Decreased short-term memory;Decreased recall of precautions Following Commands: Follows one step commands with increased time;Follows one step commands consistently Safety/Judgement: Decreased awareness of safety;Decreased awareness of deficits Awareness: Intellectual Problem Solving: Slow processing;Decreased initiation General Comments: alertness and cognition about the same as yesterday, continues to be somewhat confused and makes occasional odd statements, for example asked "how are things going over at [facility]?"      Exercises      General Comments General comments (skin integrity, edema, etc.): session focus on bed level exercises today- did not assess balance      Pertinent Vitals/Pain Pain Assessment: Faces Pain Score: 0-No pain Faces Pain Scale: No hurt Pain Intervention(s): Limited activity within patient's tolerance;Monitored during session    Home Living                        Prior Function            PT Goals (current goals can now be found in the care plan section) Acute Rehab PT Goals Patient Stated Goal: to be able to transfer with prosthesis PT Goal Formulation: Patient unable to participate in goal setting Time For Goal Achievement: 01/03/19 Potential to Achieve Goals: Good Progress towards PT goals: Progressing toward goals    Frequency    Min 2X/week      PT Plan  Frequency needs to be updated    Co-evaluation              AM-PAC PT "6 Clicks" Mobility   Outcome Measure  Help needed turning from your back to your side while in a flat bed without using bedrails?: A Lot Help needed moving from lying on your back to sitting on the side of a flat bed without using bedrails?: Total Help needed moving to and from a bed to a chair (including a wheelchair)?: Total Help needed standing up from a chair using your arms (e.g., wheelchair or bedside chair)?: Total Help needed to walk in hospital room?: Total Help needed climbing 3-5 steps with a railing? : Total 6 Click Score: 7    End of Session Equipment Utilized During Treatment: Oxygen Activity Tolerance: Patient tolerated treatment well Patient left: in bed;with call bell/phone within reach;with bed alarm set   PT Visit Diagnosis: Unsteadiness on feet (R26.81)     Time: 5462-7035 PT Time Calculation (min) (ACUTE ONLY): 18 min  Charges:  $Therapeutic Exercise: 8-22 mins                     Windell Norfolk, DPT, PN1   Supplemental Physical Therapist [place]    Pager [phone] Acute Rehab Office [phone]

## 2019-01-01 NOTE — Plan of Care (Signed)
  Problem: Elimination: Goal: Will not experience complications related to urinary retention Outcome: Progressing Note: Adequate urine output noted from external catheter.  No s/s of urinary retention noted.   Problem: Pain Management: Goal: Pain level will decrease Outcome: Progressing Note: PO pain medications effective.

## 2019-01-01 NOTE — Progress Notes (Signed)
Patient is refusing BIPAP or CPAP tonight. States she only wants the nasal cannula. RN is in room. RT will continue to monitor.

## 2019-01-01 NOTE — Progress Notes (Addendum)
PROGRESS NOTE    [name]  [id number] DOB: [birth year] DOA: 12/24/2018 PCP: [name], PA-C    Brief Narrative:53 y.o.femalewith medical history significant ofasthma, COPD, stage IV CKD, morbid obesity, depression, anxiety, hypertension, insulin-dependent diabetes mellitus, left-sided DKA, diabetic neuropathy, GERD, HIV, hyperlipidemia was at home today and was trying to get up from the commode reports feeling dizzy and fell on her right side. She reports pain on the right hip. On arrival to ED she underwent x-rays of the right hip and was found to have a Possible left pelvic bone fractures. Probable intertrochanteric fracture of the proximal right femur. As per the patient, she denied hitting her head or syncope. She reports she was trying to get up from the commode after BM, felt dizzy and fell on the right side. She denies any chest pain or sob, fever, chills, nausea, vomiting , abdominal pain, headache, tingling or numbness, diarrhea, dysuria, hematochezia, or hematemesis. She denies any blurry vision at this time.  Orthopedics consulted and she was referred to medical service for admission  Assessment & Plan:   Principal Problem:   Displaced intertrochanteric fracture of right femur, initial encounter for closed fracture ([place]) Active Problems:   Pressure injury of skin   Intertrochanteric fracture of the proximal right femur: - ortho onboard -s/pOpen treatment of intertrochanteric fracture with intramedullary implant - Pain control; watch narcotics and sedating meds d/t lethergy  Dizziness and fall Debility - Possibly secondary to hypotension. - echo: Left Ventricle: Left ventricular ejection fraction, by visual estimation, is 60 to 65%. The left ventricle has normal function. The left ventricle has no regional wall motion abnormalities. The left ventricular internal cavity size was the left  ventricle is normal in size. There is  mildly increased left ventricular hypertrophy. Left ventricular diastolic parameters were normal.Right Ventricle: The right ventricular size is mildly enlarged. No increase in right ventricular wall thickness. Global RV systolic function is has normal systolic function. - negative trp  Hypotension-blood pressure 115 / 53  To 120/61.  Continue to hold home BP meds.   IDDM complicated by diabetic neuropathy-her Lantus dose was decreased 12/31/2018.  Sugar has been stable since then.  Patient not eating much as she refuses to eat. Continue SSI Hemoglobin A1c 7.1  CBG (last 3)  Recent Labs    12/31/18 1716 12/31/18 2141 01/01/19 0742  GLUCAP 100* 90 126*   Hyperlipidemia  - crestor  CKD IIIa - Baseline creatinine ranging between 1 to 1.4. currently its at0.73  Pyuria -UCxwith yeast -stop rocephin -Urine culture with candiduria.  Not treated.  HIV  - biktarvy  COPD and Asthma: Stable - flonase, breo ellipta - PRN albuterol, duonebs - CXR with possible early infection -d/c rocephin, zithro; transition to augmentin ( end 12/29) - Bipap d/t lethergy, hypoxia  Tobacco abuse:  - Cessation counseling provided.  - Start her on nicotine patch.  Left rib fracture:  - Pain control and incentive spirometry.   Lethargy-she is more awake today.Patient needs CPAP at night. CT head stable atrophy and white matter disease. No acute intracranial abnormality.  Minimize narcotics.  Hyperkalemia potassium was 6.6 yesterday down to 5.5 today.  She was on potassium supplements which has been stopped.  Recheck labs in the morning.  Disposition she needs SNF.  Her family is not able to take care of her at home.  She has not received any bed offers.  Covid negative 12/30  Pressure Injury 12/26/18 Heel Right Stage 2 -  Partial thickness loss  of dermis presenting as a shallow open injury with a red, pink wound bed without  slough. Fluid Blister (Active)  12/26/18 0800  Location: Heel  Location Orientation: Right  Staging: Stage 2 -  Partial thickness loss of dermis presenting as a shallow open injury with a red, pink wound bed without slough.  Wound Description (Comments): Fluid Blister  Present on Admission:       Nutrition Problem: Increased nutrient needs Etiology: post-op healing     Signs/Symptoms: estimated needs    Interventions: Ensure Enlive (each supplement provides 350kcal and 20 grams of protein), MVI, Juven  Estimated body mass index is 30.02 kg/m as calculated from the following:   Height as of this encounter: 5\' 10"  (1.778 m).   Weight as of this encounter: 94.9 kg.  DVT prophylaxis:lovenox Code Status:FULL Disposition Plan:Pending SNF Covid - 12/31/2018 consultants:  Ortho  Antimicrobials:  rocephin   Subjective: She is resting in bed able to wake her up easily more awake than yesterday and day before.  Objective: Vitals:   12/31/18 1717 12/31/18 2004 01/01/19 0447 01/01/19 0922  BP: 120/66 (!) 128/52 (!) 115/53   Pulse: 78 79 77   Resp: 19 15 12    Temp: 98.6 F (37 C) 99.6 F (37.6 C) 98.9 F (37.2 C)   TempSrc: Oral Oral Oral   SpO2: 98% 98% 99% 91%  Weight:   94.9 kg   Height:        Intake/Output Summary (Last 24 hours) at 01/01/2019 1102 Last data filed at 01/01/2019 0900 Gross per 24 hour  Intake 240 ml  Output 3275 ml  Net -3035 ml   Filed Weights   12/30/18 0452 12/31/18 0500 01/01/19 0447  Weight: 99.8 kg 96.5 kg 94.9 kg    Examination:  General exam: Appears calm and comfortable  Respiratory system: Clear to auscultation. Respiratory effort normal. Cardiovascular system: S1 & S2 heard, RRR. No JVD, murmurs, rubs, gallops or clicks. No pedal edema. Gastrointestinal system: Abdomen is nondistended, soft and nontender. No organomegaly or masses felt. Normal bowel sounds heard. Central nervous system: Alert and oriented. No  focal neurological deficits. Extremities: Right hip incision covered with dressing.  Left BKA Skin: No rashes, lesions or ulcers Psychiatry: Judgement and insight appear normal. Mood & affect appropriate.     Data Reviewed: I have personally reviewed following labs and imaging studies  CBC: Recent Labs  Lab 12/26/18 0403 12/27/18 0439 12/28/18 0319 12/29/18 0329 12/30/18 0345 12/31/18 0406  WBC 11.9* 10.3 9.8 9.4 9.4 10.6*  NEUTROABS 8.6*  --  6.4 5.7 5.4 6.4  HGB 9.2* 8.0* 7.3* 7.0* 7.3* 8.7*  HCT 30.3* 26.8* 25.3* 23.6* 24.7* 28.7*  MCV 80.2 81.7 84.1 81.7 82.3 81.5  PLT 257 202 248 308 338 [id number]   Basic Metabolic Panel: Recent Labs  Lab 12/25/18 1106 12/26/18 0403 12/28/18 0319 12/29/18 0329 12/30/18 0345 12/31/18 1441 01/01/19 0510  NA 138 137 141 140 140 136 137  K 3.7 3.7 3.6 3.4* 4.8 6.6* 5.5*  CL 101 102 107 104 103 98 98  CO2 26 25 28 28 28  33* 31  GLUCOSE 90 139* 101* 66* 104* 117* 117*  BUN 13 12 12 10 10 11 13   CREATININE 1.44* 1.21* 0.74 0.73 0.71 0.75 0.75  CALCIUM 8.0* 7.7* 7.8* 7.7* 8.1* 8.4* 8.6*  MG 1.2* 1.5* 1.6* 1.7 1.5* 1.8  --   PHOS 4.1 2.9 2.1* 1.7* 2.3*  --   --    GFR: Estimated Creatinine Clearance:  100.4 mL/min (by C-G formula based on SCr of 0.75 mg/dL). Liver Function Tests: Recent Labs  Lab 12/26/18 0403 12/26/18 0827 12/28/18 0319 12/29/18 0329 12/30/18 0345  AST  --  19  --   --   --   ALT  --  9  --   --   --   ALKPHOS  --  124  --   --   --   BILITOT  --  0.2*  --   --   --   PROT  --  5.6*  --   --   --   ALBUMIN 1.8* 1.7* 1.5* 1.4* 1.4*   No results for input(s): LIPASE, AMYLASE in the last 168 hours. Recent Labs  Lab 12/26/18 0827 12/28/18 0319  AMMONIA 42* 37*   Coagulation Profile: No results for input(s): INR, PROTIME in the last 168 hours. Cardiac Enzymes: No results for input(s): CKTOTAL, CKMB, CKMBINDEX, TROPONINI in the last 168 hours. BNP (last 3 results) No results for input(s): PROBNP in the last  8760 hours. HbA1C: No results for input(s): HGBA1C in the last 72 hours. CBG: Recent Labs  Lab 12/31/18 0751 12/31/18 1131 12/31/18 1716 12/31/18 2141 01/01/19 0742  GLUCAP 115* 101* 100* 90 126*   Lipid Profile: No results for input(s): CHOL, HDL, LDLCALC, TRIG, CHOLHDL, LDLDIRECT in the last 72 hours. Thyroid Function Tests: No results for input(s): TSH, T4TOTAL, FREET4, T3FREE, THYROIDAB in the last 72 hours. Anemia Panel: No results for input(s): VITAMINB12, FOLATE, FERRITIN, TIBC, IRON, RETICCTPCT in the last 72 hours. Sepsis Labs: Recent Labs  Lab 12/26/18 0526 12/26/18 0827  LATICACIDVEN 1.2 0.8    Recent Results (from the past 240 hour(s))  Blood culture (routine x 2)     Status: None   Collection Time: 12/24/18 12:39 PM   Specimen: BLOOD  Result Value Ref Range Status   Specimen Description BLOOD SITE NOT SPECIFIED  Final   Special Requests   Final    BOTTLES DRAWN AEROBIC AND ANAEROBIC Blood Culture adequate volume   Culture   Final    NO GROWTH 5 DAYS Performed at [facility], [address], [place], [place] [address]    Report Status 12/29/2018 FINAL  Final  Blood culture (routine x 2)     Status: None   Collection Time: 12/24/18  1:37 PM   Specimen: BLOOD  Result Value Ref Range Status   Specimen Description BLOOD SITE NOT SPECIFIED  Final   Special Requests   Final    BOTTLES DRAWN AEROBIC AND ANAEROBIC Blood Culture adequate volume   Culture   Final    NO GROWTH 5 DAYS Performed at [facility], [place] [address], [place], [place] [address]    Report Status 12/29/2018 FINAL  Final  SARS CORONAVIRUS 2 (TAT 6-24 HRS) Nasopharyngeal Nasopharyngeal Swab     Status: None   Collection Time: 12/24/18  2:21 PM   Specimen: Nasopharyngeal Swab  Result Value Ref Range Status   SARS Coronavirus 2 NEGATIVE NEGATIVE Final    Comment: (NOTE) SARS-CoV-2 target nucleic acids are NOT DETECTED. The SARS-CoV-2 RNA is generally detectable in upper and  lower respiratory specimens during the acute phase of infection. Negative results do not preclude SARS-CoV-2 infection, do not rule out co-infections with other pathogens, and should not be used as the sole basis for treatment or other patient management decisions. Negative results must be combined with clinical observations, patient history, and epidemiological information. The expected result is Negative.  Fact Sheet for Patients: SugarRoll.be Fact Sheet for Healthcare Providers: https://www.woods-.com/ This test is not yet approved or cleared by the [place] FDA and  has been authorized for detection and/or diagnosis of SARS-CoV-2 by FDA under an Emergency Use Authorization (EUA). This EUA will remain  in effect (meaning this test can be used) for the duration of the COVID-19 declaration under Section 56 4(b)(1) of the Act, 21 U.S.C. section 360bbb-3(b)(1), unless the authorization is terminated or revoked sooner. Performed at [facility], [place] [address], [place], [place] [address]   Surgical pcr screen     Status: None   Collection Time: 12/24/18 10:13 PM   Specimen: Nasal Mucosa; Nasal Swab  Result Value Ref Range Status   MRSA, PCR NEGATIVE NEGATIVE Final   Staphylococcus aureus NEGATIVE NEGATIVE Final    Comment: (NOTE) The Xpert SA Assay (FDA approved for NASAL specimens in patients 26 years of age and older), is one component of a comprehensive surveillance program. It is not intended to diagnose infection nor to guide or monitor treatment. Performed at [facility], [place] [address], [place], [place] [address]   Culture, Urine     Status: Abnormal   Collection Time: 12/25/18 10:30 PM   Specimen: Urine, Catheterized  Result Value Ref Range Status   Specimen Description URINE, CATHETERIZED  Final   Special Requests   Final    NONE Performed at [facility], [place] [address] [place], [place]  [address]    Culture >=100,000 COLONIES/mL YEAST (A)  Final   Report Status 12/27/2018 FINAL  Final  SARS CORONAVIRUS 2 (TAT 6-24 HRS) Nasopharyngeal Nasopharyngeal Swab     Status: None   Collection Time: 12/31/18 12:36 PM   Specimen: Nasopharyngeal Swab  Result Value Ref Range Status   SARS Coronavirus 2 NEGATIVE NEGATIVE Final    Comment: (NOTE) SARS-CoV-2 target nucleic acids are NOT DETECTED. The SARS-CoV-2 RNA is generally detectable in upper and lower respiratory specimens during the acute phase of infection. Negative results do not preclude SARS-CoV-2 infection, do not rule out co-infections with other pathogens, and should not be used as the sole basis for treatment or other patient management decisions. Negative results must be combined with clinical observations, patient history, and epidemiological information. The expected result is Negative. Fact Sheet for Patients: SugarRoll.be Fact Sheet for Healthcare Providers: https://www.woods-.com/ This test is not yet approved or cleared by the [place] FDA and  has been authorized for detection and/or diagnosis of SARS-CoV-2 by FDA under an Emergency Use Authorization (EUA). This EUA will remain  in effect (meaning this test can be used) for the duration of the COVID-19 declaration under Section 56 4(b)(1) of the Act, 21 U.S.C. section 360bbb-3(b)(1), unless the authorization is terminated or revoked sooner. Performed at [facility], [place] [address], [place], [place] [address]          Radiology Studies: No results found.      Scheduled Meds: . bictegravir-emtricitabine-tenofovir AF  1 tablet Oral Daily  . docusate sodium  100 mg Oral BID  . enoxaparin (LOVENOX) injection  40 mg Subcutaneous Q24H  . feeding supplement (ENSURE ENLIVE)  237 mL Oral BID BM  . fluticasone  1 spray Each Nare Daily  . fluticasone furoate-vilanterol  1 puff Inhalation Daily    And  . umeclidinium bromide  1 puff Inhalation Daily  . insulin aspart  0-5 Units Subcutaneous QHS  . insulin aspart  0-9 Units Subcutaneous TID  WC  . insulin glargine  10 Units Subcutaneous QHS  . multivitamin with minerals  1 tablet Oral Daily  . nutrition supplement (JUVEN)  1 packet Oral BID BM  . pantoprazole  40 mg Oral Daily  . pregabalin  225 mg Oral BID  . rosuvastatin  20 mg Oral Daily  . sertraline  150 mg Oral Daily   Continuous Infusions: . methocarbamol (ROBAXIN) IV    . sodium chloride       LOS: 8 days     Georgette Shell, MD [facility]  If 7PM-7AM, please contact night-coverage [email or web address] Password [email or web address] 01/01/2019, 11:02 AM

## 2019-01-01 NOTE — Discharge Instructions (Signed)
    1. Change dressings as needed 2. May shower but keep incisions covered and dry 3. Take lovenox to prevent blood clots 4. Take stool softeners as needed 5. Take pain meds as needed    Nutrition [facility] Stay Proper nutrition can help your body recover from illness and injury.   Foods and beverages high in protein, vitamins, and minerals help rebuild muscle loss, promote healing, & reduce fall risk.   .In addition to eating healthy foods, a nutrition shake is an easy, delicious way to get the nutrition you need during and after your hospital stay  It is recommended that you continue to drink 2 bottles per day of:       Ensure or Boost for at least 1 month (30 days) after your hospital stay   Tips for adding a nutrition shake into your routine: As allowed, drink one with vitamins or medications instead of water or juice Enjoy one as a tasty mid-morning or afternoon snack Drink cold or make a milkshake out of it Drink one instead of milk with cereal or snacks Use as a coffee creamer   Available at the following grocery stores and pharmacies:           * [facility] [phone]            For COUPONS visit: www.ensure.com/join or http://dawson-may.com/   Suggested Substitutions Ensure Plus = Boost Plus = Carnation Breakfast Essentials = Boost Compact Ensure Active Clear = Boost Breeze Glucerna Shake = Boost Glucose Control = Carnation Breakfast Essentials SUGAR FREE

## 2019-01-01 NOTE — Progress Notes (Signed)
Nutrition Follow-up  DOCUMENTATION CODES:   Obesity unspecified  INTERVENTION:   -Ensure Enlive po BID, each supplement provides 350 kcal and 20 grams of protein -Multivitamin with minerals daily -Juven Fruit Punch BID, each serving provides 95kcal and 2.5g of protein (amino acids glutamine and arginine)  NUTRITION DIAGNOSIS:   Increased nutrient needs related to post-op healing as evidenced by estimated needs.  Ongoing.  GOAL:   Patient will meet greater than or equal to 90% of their needs  Not meeting.   MONITOR:   PO intake, Supplement acceptance, Diet advancement, Labs, Weight trends, Skin, I & O's    ASSESSMENT:   [name] is a 53 y.o. female with medical history significant of asthma, COPD, stage IV CKD, morbid obesity, depression, anxiety, hypertension, insulin-dependent diabetes mellitus, left-sided DKA, diabetic neuropathy, GERD, HIV, hyperlipidemia was at home today and was trying to get up from the commode reports feeling dizzy and fell on her right side.  She reports pain on the right hip.  On arrival to ED she underwent x-rays of the right hip and was found to have a Possible left pelvic bone fractures. Probable intertrochanteric fracture of the proximal right femur. As per the patient, she denied hitting her head or syncope. She reports she was trying to get up from the commode after BM, felt dizzy and fell on the right side. She denies any chest pain or sob, fever, chills, nausea, vomiting , abdominal pain, headache, tingling or numbness, diarrhea, dysuria, hematochezia, or hematemesis. She denies any blurry vision at this time. Orthopedics consulted and she was referred to medical service for admission. Pt admitted with closed rt hip fracture  12/24: s/p INTRAMEDULLARY (IM) NAIL INTERTROCHANTRIC (Right Leg Upper) 12/27: diet advanced to CHO modified  **RD working remotely**  Per MD note, pt awaiting SNF bed. Pt is not eating much d/t poor appetite. Pt  with new stage 2 right heel pressure injury as of 12/25.  Will increase Ensure supplement order to BID and add Juven supplements for wound healing.  Admission weight: 235 lbs. Current weight: 209 lbs  I/Os: +3.2L since admit UOP: 3275 ml x 24 hrs  Medications: Multivitamin with minerals daily Labs reviewed: CBGs: 90-126 K -5.5 -trending down  Diet Order:   Diet Order            Diet Carb Modified Fluid consistency: Thin; Room service appropriate? Yes  Diet effective now              EDUCATION NEEDS:   No education needs have been identified at this time  Skin:  Skin Assessment: Skin Integrity Issues: Skin Integrity Issues:: Stage II Stage II: rt heel Other: MASD groin  Last BM:  12/30  Height:   Ht Readings from Last 1 Encounters:  12/25/18 5\' 10"  (1.778 m)    Weight:   Wt Readings from Last 1 Encounters:  01/01/19 94.9 kg    Ideal Body Weight:  64 kg(adjusted for left BKA)  BMI:  Body mass index is 30.02 kg/m.  Estimated Nutritional Needs:   Kcal:  [id number]  Protein:  100-115 grams  Fluid:  > 1.8 L  Clayton Bibles, MS, RD, LDN Inpatient Clinical Dietitian Pager: [phone] After Hours Pager: [phone]

## 2019-01-01 NOTE — TOC Progression Note (Signed)
Transition of Care [facility]) - Progression Note    Patient Details  Name: AHLAM CHEZEM MRN: [id number] Date of Birth: [birth year]  Transition of Care [facility] CM/SW Contact  Eileen Stanford, LCSW Phone Number: 01/01/2019, 10:41 AM  Clinical Narrative:   Pt continues to have no bed offers. CSW is reaching out to SNF's individually. [facility] only offer. [facility] still has no beds.    Expected Discharge Plan: [place] Barriers to Discharge: Continued Medical Work up  Expected Discharge Plan and Services Expected Discharge Plan: [place] In-house Referral: Clinical Social Work   Post Acute Care Choice: [place] Living arrangements for the past 2 months: [place] Determinants of Health (SDOH) Interventions    Readmission Risk Interventions Readmission Risk Prevention Plan 06/17/2018  Transportation Screening Complete  Medication Review [employer]) Complete  PCP or Specialist appointment within 3-5 days of discharge Complete  HRI or Home Care Consult Complete  SW Recovery Care/Counseling Consult Complete  Palliative Care Screening Not [place] Complete  Some recent data might be hidden

## 2019-01-02 LAB — GLUCOSE, CAPILLARY
Glucose-Capillary: 175 mg/dL — ABNORMAL HIGH (ref 70–99)
Glucose-Capillary: 265 mg/dL — ABNORMAL HIGH (ref 70–99)
Glucose-Capillary: 231 mg/dL — ABNORMAL HIGH (ref 70–99)
Glucose-Capillary: 186 mg/dL — ABNORMAL HIGH (ref 70–99)

## 2019-01-02 LAB — BASIC METABOLIC PANEL WITH GFR
Anion gap: 10 (ref 5–15)
BUN: 19 mg/dL (ref 6–20)
CO2: 31 mmol/L (ref 22–32)
Calcium: 8.8 mg/dL — ABNORMAL LOW (ref 8.9–10.3)
Chloride: 97 mmol/L — ABNORMAL LOW (ref 98–111)
Creatinine, Ser: 1 mg/dL (ref 0.44–1.00)
GFR calc Af Amer: >60 mL/min
GFR calc non Af Amer: >60 mL/min
Glucose, Bld: 195 mg/dL — ABNORMAL HIGH (ref 70–99)
Potassium: 5.4 mmol/L — ABNORMAL HIGH (ref 3.5–5.1)
Sodium: 138 mmol/L (ref 135–145)

## 2019-01-02 MED ORDER — CHLORHEXIDINE GLUCONATE 0.12 % MT SOLN
15.0000 mL | Freq: Four times a day (QID) | OROMUCOSAL | Status: DC
  Administered 2019-01-02 – 2019-01-05 (×6): 15 mL via OROMUCOSAL
  Filled 2019-01-02 (×11): qty 15

## 2019-01-02 NOTE — Progress Notes (Signed)
PROGRESS NOTE    [name]  [id number] DOB: Mar 08, 1965 DOA: 12/24/2018 PCP: [name], PA-C    Brief Narrative:53 y.o.femalewith medical history significant ofasthma, COPD, stage IV CKD, morbid obesity, depression, anxiety, hypertension, insulin-dependent diabetes mellitus, left-sided BKA, diabetic neuropathy, GERD, HIV, hyperlipidemia was at home today and was trying to get up from the commode reports feeling dizzy and fell on her right side. She reports pain on the right hip. On arrival to ED she underwent x-rays of the right hip and was found to have a Possible left pelvic bone fractures. Probable intertrochanteric fracture of the proximal right femur. As per the patient, she denied hitting her head or syncope. She reports she was trying to get up from the commode after BM, felt dizzy and fell on the right side. She denies any chest pain or sob, fever, chills, nausea, vomiting , abdominal pain, headache, tingling or numbness, diarrhea, dysuria, hematochezia, or hematemesis. She denies any blurry vision at this time.  Orthopedics consulted and she was referred to medical service for admission  Admitted 12/24/18 as above  12/25/18: surgery -Open treatment of intertrochanteric fracture with intramedullary implant 12/26/18: POD1 doing ok from surgical standpoint. More lethargic ON and AM. Moved to progressive and placed on BiPap. Ammonia only mildly elevated. Fevers noted. UA weakly positive and CXR suspicious for early infection. Rocephin, zithro started. Continue for now. Repeat ABG closer to shift change. She is a little more alert now. 12/27/18: was able to move down to 3L. Opens eyes and follows some commands, however, she is still overall lethargic this AM. Will check a CTH. Repeat ammonia.  12/28/18: More alert and interactive this AM. CTH negative. She is doing better. Advance diet. Replete lytes 12/29/18: Alert today. Spoke with son. Difficult situation at home. She  is not mobile and takes pain meds all day. They are unable to take care of her and she seems unwilling to take care of her self.  12/30/18: nothing much new!  12/31/18: PT recommended SNF 01/01/19: Disposition she needs SNF, no bed offers.  Her family is not able to take care of her at home.  She has not received any bed offers.  Covid negative 12/30 Today 01/02/19: pt requesting eval for inpatient rehab. Her goal is to get stronger so she can help care for her grandkids.     Assessment & Plan:   Principal Problem:   Displaced intertrochanteric fracture of right femur, initial encounter for closed fracture [facility]) Active Problems:   Pressure injury of skin   Intertrochanteric fracture of the proximal right femur: OK from surgical standpoint s/pOpen treatment of intertrochanteric fracture with intramedullary implant Pain control; watch narcotics and sedating meds d/t lethergy  Dizziness and fall Debility - Possibly secondary to hypotension. - echo: Left Ventricle: Left ventricular ejection fraction, by visual estimation, is 60 to 65%. The left ventricle has normal function. The left ventricle has no regional wall motion abnormalities. The left ventricular internal cavity size was the left  ventricle is normal in size. There is mildly increased left ventricular hypertrophy. Left ventricular diastolic parameters were normal.Right Ventricle: The right ventricular size is mildly enlarged. No increase in right ventricular wall thickness. Global RV systolic function is has normal systolic function. - negative trp  Hypotension Continue to hold home BP meds.   IDDM complicated by diabetic neuropathy- Lantus dose was decreased 12/31/2018.  Sugar has been stable since then.  Patient not eating much as she sometimes refuses to eat. Continue SSI Hemoglobin A1c  7.1  CBG (last 3)  Recent Labs    01/01/19 2156 01/02/19 0731 01/02/19 1139  GLUCAP 181* 175* 265*    Hyperlipidemia  - crestor  CKD IIIa - Baseline creatinine ranging between 1 to 1.4. currently its at0.73  Pyuria -UCxwith yeast -stop rocephin -Urine culture with candiduria.  Not treated.  HIV  - biktarvy  COPD and Asthma: Stable - flonase, breo ellipta - PRN albuterol, duonebs - CXR with possible early infection -d/c rocephin, zithro; transition to augmentin ( end 12/29) - Bipap d/t lethergy, hypoxia  Tobacco abuse:  - Cessation counseling provided.  - Start her on nicotine patch.   Left rib fracture:  - Pain control and incentive spirometry.   Lethargy-she is awake today.Patient needs CPAP at night. CT head stable atrophy and white matter disease. No acute intracranial abnormality.  Minimize narcotics.  Hyperkalemia potassium was 6.6 yesterday down to 5.5 today.  She was on potassium supplements which has been stopped.  Recheck labs in the morning.  Disposition she needs SNF.  Her family is not able to take care of her at home.  She has not received any bed offers.  Covid negative 12/30. Order placed 01/02/19 for CIR eval   Pressure Injury 12/26/18 Heel Right Stage 2 -  Partial thickness loss of dermis presenting as a shallow open injury with a red, pink wound bed without slough. Fluid Blister (Active)  12/26/18 0800  Location: Heel  Location Orientation: Right  Staging: Stage 2 -  Partial thickness loss of dermis presenting as a shallow open injury with a red, pink wound bed without slough.  Wound Description (Comments): Fluid Blister  Present on Admission:       Nutrition Problem: Increased nutrient needs Etiology: post-op healing     Signs/Symptoms: estimated needs    Interventions: Ensure Enlive (each supplement provides 350kcal and 20 grams of protein), MVI, Juven  Estimated body mass index is 29.1 kg/m as calculated from the following:   Height as of this encounter: 5\' 10"   (1.778 m).   Weight as of this encounter: 92 kg.  DVT prophylaxis:lovenox Code Status:FULL Disposition Plan:Pending SNF Covid - 12/31/2018 consultants:  Ortho  Antimicrobials:  rocephin   Subjective: She is resting in bed able to wake her up easily more awake than yesterday and day before.  Objective: Vitals:   01/01/19 2154 01/02/19 0456 01/02/19 0505 01/02/19 0908  BP: (!) 100/52 102/73    Pulse: 72 80    Resp: 14 14    Temp: 98.5 F (36.9 C) 98.7 F (37.1 C)    TempSrc: Oral Oral    SpO2: 97%   97%  Weight:   92 kg   Height:        Intake/Output Summary (Last 24 hours) at 01/02/2019 1442 Last data filed at 01/02/2019 1300 Gross per 24 hour  Intake 500 ml  Output 600 ml  Net -100 ml   Filed Weights   12/31/18 0500 01/01/19 0447 01/02/19 0505  Weight: 96.5 kg 94.9 kg 92 kg    Examination:  General exam: Appears calm and comfortable  Respiratory system: Clear to auscultation. Respiratory effort normal. Cardiovascular system: S1 & S2 heard, RRR. No JVD, murmurs, rubs, gallops or clicks. No pedal edema. Gastrointestinal system: Abdomen is nondistended, soft and nontender. No organomegaly or masses felt. Normal bowel sounds heard. Central nervous system: Alert and oriented. No focal neurological deficits. Extremities: Right hip incision covered with dressing.  Left BKA Skin: No rashes, lesions or ulcers Psychiatry:  Judgement and insight appear normal. Mood & affect appropriate.     Data Reviewed: I have personally reviewed following labs and imaging studies  CBC: Recent Labs  Lab 12/27/18 0439 12/28/18 0319 12/29/18 0329 12/30/18 0345 12/31/18 0406  WBC 10.3 9.8 9.4 9.4 10.6*  NEUTROABS  --  6.4 5.7 5.4 6.4  HGB 8.0* 7.3* 7.0* 7.3* 8.7*  HCT 26.8* 25.3* 23.6* 24.7* 28.7*  MCV 81.7 84.1 81.7 82.3 81.5  PLT 202 248 308 338 [id number]   Basic Metabolic Panel: Recent Labs  Lab 12/28/18 0319 12/29/18 0329 12/30/18 0345 12/31/18 1441  01/01/19 0510 01/02/19 0853  NA 141 140 140 136 137 138  K 3.6 3.4* 4.8 6.6* 5.5* 5.4*  CL 107 104 103 98 98 97*  CO2 28 28 28  33* 31 31  GLUCOSE 101* 66* 104* 117* 117* 195*  BUN 12 10 10 11 13 19   CREATININE 0.74 0.73 0.71 0.75 0.75 1.00  CALCIUM 7.8* 7.7* 8.1* 8.4* 8.6* 8.8*  MG 1.6* 1.7 1.5* 1.8  --   --   PHOS 2.1* 1.7* 2.3*  --   --   --    GFR: Estimated Creatinine Clearance: 79.1 mL/min (by C-G formula based on SCr of 1 mg/dL). Liver Function Tests: Recent Labs  Lab 12/28/18 0319 12/29/18 0329 12/30/18 0345  ALBUMIN 1.5* 1.4* 1.4*   No results for input(s): LIPASE, AMYLASE in the last 168 hours. Recent Labs  Lab 12/28/18 0319  AMMONIA 37*   Coagulation Profile: No results for input(s): INR, PROTIME in the last 168 hours. Cardiac Enzymes: No results for input(s): CKTOTAL, CKMB, CKMBINDEX, TROPONINI in the last 168 hours. BNP (last 3 results) No results for input(s): PROBNP in the last 8760 hours. HbA1C: No results for input(s): HGBA1C in the last 72 hours. CBG: Recent Labs  Lab 01/01/19 1113 01/01/19 1700 01/01/19 2156 01/02/19 0731 01/02/19 1139  GLUCAP 148* 230* 181* 175* 265*   Lipid Profile: No results for input(s): CHOL, HDL, LDLCALC, TRIG, CHOLHDL, LDLDIRECT in the last 72 hours. Thyroid Function Tests: No results for input(s): TSH, T4TOTAL, FREET4, T3FREE, THYROIDAB in the last 72 hours. Anemia Panel: No results for input(s): VITAMINB12, FOLATE, FERRITIN, TIBC, IRON, RETICCTPCT in the last 72 hours. Sepsis Labs: No results for input(s): PROCALCITON, LATICACIDVEN in the last 168 hours.  Recent Results (from the past 240 hour(s))  Blood culture (routine x 2)     Status: None   Collection Time: 12/24/18 12:39 PM   Specimen: BLOOD  Result Value Ref Range Status   Specimen Description BLOOD SITE NOT SPECIFIED  Final   Special Requests   Final    BOTTLES DRAWN AEROBIC AND ANAEROBIC Blood Culture adequate volume   Culture   Final    NO GROWTH 5  DAYS Performed at [facility], [address], [place], [place] [address]    Report Status 12/29/2018 FINAL  Final  Blood culture (routine x 2)     Status: None   Collection Time: 12/24/18  1:37 PM   Specimen: BLOOD  Result Value Ref Range Status   Specimen Description BLOOD SITE NOT SPECIFIED  Final   Special Requests   Final    BOTTLES DRAWN AEROBIC AND ANAEROBIC Blood Culture adequate volume   Culture   Final    NO GROWTH 5 DAYS Performed at [facility], [place] [address], [place], [place] [address]    Report Status 12/29/2018 FINAL  Final  SARS CORONAVIRUS 2 (TAT 6-24 HRS) Nasopharyngeal  Nasopharyngeal Swab     Status: None   Collection Time: 12/24/18  2:21 PM   Specimen: Nasopharyngeal Swab  Result Value Ref Range Status   SARS Coronavirus 2 NEGATIVE NEGATIVE Final    Comment: (NOTE) SARS-CoV-2 target nucleic acids are NOT DETECTED. The SARS-CoV-2 RNA is generally detectable in upper and lower respiratory specimens during the acute phase of infection. Negative results do not preclude SARS-CoV-2 infection, do not rule out co-infections with other pathogens, and should not be used as the sole basis for treatment or other patient management decisions. Negative results must be combined with clinical observations, patient history, and epidemiological information. The expected result is Negative. Fact Sheet for Patients: SugarRoll.be Fact Sheet for Healthcare Providers: https://www.woods-mathews.com/ This test is not yet approved or cleared by the [place] FDA and  has been authorized for detection and/or diagnosis of SARS-CoV-2 by FDA under an Emergency Use Authorization (EUA). This EUA will remain  in effect (meaning this test can be used) for the duration of the COVID-19 declaration under Section 56 4(b)(1) of the Act, 21 U.S.C. section 360bbb-3(b)(1), unless the authorization is terminated or revoked  sooner. Performed at [facility], [place] [address] [place], [place] [address]   Surgical pcr screen     Status: None   Collection Time: 12/24/18 10:13 PM   Specimen: Nasal Mucosa; Nasal Swab  Result Value Ref Range Status   MRSA, PCR NEGATIVE NEGATIVE Final   Staphylococcus aureus NEGATIVE NEGATIVE Final    Comment: (NOTE) The Xpert SA Assay (FDA approved for NASAL specimens in patients 17 years of age and older), is one component of a comprehensive surveillance program. It is not intended to diagnose infection nor to guide or monitor treatment. Performed at [facility], [place] [address] [place], [place] [address]   Culture, Urine     Status: Abnormal   Collection Time: 12/25/18 10:30 PM   Specimen: Urine, Catheterized  Result Value Ref Range Status   Specimen Description URINE, CATHETERIZED  Final   Special Requests   Final    NONE Performed at [facility], [place] [address] [place], [place] [address]    Culture >=100,000 COLONIES/mL YEAST (A)  Final   Report Status 12/27/2018 FINAL  Final  SARS CORONAVIRUS 2 (TAT 6-24 HRS) Nasopharyngeal Nasopharyngeal Swab     Status: None   Collection Time: 12/31/18 12:36 PM   Specimen: Nasopharyngeal Swab  Result Value Ref Range Status   SARS Coronavirus 2 NEGATIVE NEGATIVE Final    Comment: (NOTE) SARS-CoV-2 target nucleic acids are NOT DETECTED. The SARS-CoV-2 RNA is generally detectable in upper and lower respiratory specimens during the acute phase of infection. Negative results do not preclude SARS-CoV-2 infection, do not rule out co-infections with other pathogens, and should not be used as the sole basis for treatment or other patient management decisions. Negative results must be combined with clinical observations, patient history, and epidemiological information. The expected result is Negative. Fact Sheet for Patients: SugarRoll.be Fact Sheet for Healthcare  Providers: https://www.woods-mathews.com/ This test is not yet approved or cleared by the [place] FDA and  has been authorized for detection and/or diagnosis of SARS-CoV-2 by FDA under an Emergency Use Authorization (EUA). This EUA will remain  in effect (meaning this test can be used) for the duration of the COVID-19 declaration under Section 56 4(b)(1) of the Act, 21 U.S.C. section 360bbb-3(b)(1), unless the authorization is terminated or revoked sooner. Performed at [facility], [address] [place], [place] [address]          Radiology Studies: No results found.      Scheduled Meds: . bictegravir-emtricitabine-tenofovir AF  1 tablet Oral Daily  . docusate sodium  100 mg Oral BID  . enoxaparin (LOVENOX) injection  40 mg Subcutaneous Q24H  . feeding supplement (ENSURE ENLIVE)  237 mL Oral BID BM  . fluticasone  1 spray Each Nare Daily  . fluticasone furoate-vilanterol  1 puff Inhalation Daily   And  . umeclidinium bromide  1 puff Inhalation Daily  . insulin aspart  0-5 Units Subcutaneous QHS  . insulin aspart  0-9 Units Subcutaneous TID WC  . insulin glargine  10 Units Subcutaneous QHS  . multivitamin with minerals  1 tablet Oral Daily  . nutrition supplement (JUVEN)  1 packet Oral BID BM  . pantoprazole  40 mg Oral Daily  . pregabalin  225 mg Oral BID  . rosuvastatin  20 mg Oral Daily  . sertraline  150 mg Oral Daily   Continuous Infusions: . methocarbamol (ROBAXIN) IV    . sodium chloride       LOS: 9 days     Emeterio Reeve, DO [facility]   01/02/2019, 2:42 PM

## 2019-01-02 NOTE — TOC Progression Note (Signed)
Transition of Care [facility]) - Progression Note    Patient Details  Name: PATRYCJA BAGSHAW MRN: [id number] Date of Birth: 1965-01-28  Transition of Care [facility]) CM/SW [place], [place] Phone Number: 01/02/2019, 2:09 PM  Clinical Narrative:     Patient agreeable to [facility]. She wanted to explore inpatient rehab.   CSW informed MD patient's request for inpatient rehab. CSW will continue to follow and assist with discharge planning.   Expected Discharge Plan: [place] Barriers to Discharge: Continued Medical Work up  Expected Discharge Plan and Services Expected Discharge Plan: [place] In-house Referral: Clinical Social Work   Post Acute Care Choice: [place] Living arrangements for the past 2 months: [place] Determinants of Health (SDOH) Interventions    Readmission Risk Interventions Readmission Risk Prevention Plan 06/17/2018  Transportation Screening Complete  Medication Review Press photographer) Complete  PCP or Specialist appointment within 3-5 days of discharge Complete  HRI or Home Care Consult Complete  SW Recovery Care/Counseling Consult Complete  Palliative Care Screening Not [place] Complete  Some recent data might be hidden

## 2019-01-03 LAB — GLUCOSE, CAPILLARY
Glucose-Capillary: 157 mg/dL — ABNORMAL HIGH (ref 70–99)
Glucose-Capillary: 220 mg/dL — ABNORMAL HIGH (ref 70–99)
Glucose-Capillary: 287 mg/dL — ABNORMAL HIGH (ref 70–99)
Glucose-Capillary: 227 mg/dL — ABNORMAL HIGH (ref 70–99)

## 2019-01-03 LAB — BASIC METABOLIC PANEL
Anion gap: 9 (ref 5–15)
BUN: 26 mg/dL — ABNORMAL HIGH (ref 6–20)
CO2: 29 mmol/L (ref 22–32)
Calcium: 8.2 mg/dL — ABNORMAL LOW (ref 8.9–10.3)
Chloride: 97 mmol/L — ABNORMAL LOW (ref 98–111)
Creatinine, Ser: 1.3 mg/dL — ABNORMAL HIGH (ref 0.44–1.00)
GFR calc Af Amer: 54 mL/min — ABNORMAL LOW (ref >60)
GFR calc non Af Amer: 46 mL/min — ABNORMAL LOW (ref >60)
Glucose, Bld: 265 mg/dL — ABNORMAL HIGH (ref 70–99)
Potassium: 4.4 mmol/L (ref 3.5–5.1)
Sodium: 135 mmol/L (ref 135–145)

## 2019-01-03 NOTE — Progress Notes (Signed)
PROGRESS NOTE    [name]  [id number] DOB: 10/19/1965 DOA: 12/24/2018 PCP: [name], PA-C    Brief Narrative:54 y.o.femalewith medical history significant ofasthma, COPD, stage IV CKD, morbid obesity, depression, anxiety, hypertension, insulin-dependent diabetes mellitus, left-sided BKA, diabetic neuropathy, GERD, HIV, hyperlipidemia was at home today and was trying to get up from the commode reports feeling dizzy and fell on her right side. She reports pain on the right hip. On arrival to ED she underwent x-rays of the right hip and was found to have a Possible left pelvic bone fractures. Probable intertrochanteric fracture of the proximal right femur. As per the patient, she denied hitting her head or syncope. She reports she was trying to get up from the commode after BM, felt dizzy and fell on the right side. She denies any chest pain or sob, fever, chills, nausea, vomiting , abdominal pain, headache, tingling or numbness, diarrhea, dysuria, hematochezia, or hematemesis. She denies any blurry vision at this time.  Orthopedics consulted and she was referred to medical service for admission  Admitted 12/24/18 as above  12/25/18: surgery -Open treatment of intertrochanteric fracture with intramedullary implant 12/26/18: POD1 doing ok from surgical standpoint. More lethargic ON and AM. Moved to progressive and placed on BiPap. Ammonia only mildly elevated. Fevers noted. UA weakly positive and CXR suspicious for early infection. Rocephin, zithro started. Continue for now. Repeat ABG closer to shift change. She is a little more alert now. 12/27/18: was able to move down to 3L. Opens eyes and follows some commands, however, she is still overall lethargic this AM. Will check a CTH. Repeat ammonia.  12/28/18: More alert and interactive this AM. CTH negative. She is doing better. Advance diet. Replete lytes 12/29/18: Alert today. Spoke with son. Difficult situation at home. She  is not mobile and takes pain meds all day. They are unable to take care of her and she seems unwilling to take care of her self.  12/30/18: nothing much new!  12/31/18: PT recommended SNF 01/01/19: Disposition she needs SNF, no bed offers.  Her family is not able to take care of her at home.  She has not received any bed offers.  Covid negative 12/30 01/02/19: pt requesting eval for inpatient rehab. Her goal is to get stronger so she can help care for her grandkids.  01/03/19 today, we are awaiting reevaluation by PT re: appropriate for CIR     Assessment & Plan:   Principal Problem:   Displaced intertrochanteric fracture of right femur, initial encounter for closed fracture [facility]) Active Problems:   Pressure injury of skin   Intertrochanteric fracture of the proximal right femur: OK from surgical standpoint s/pOpen treatment of intertrochanteric fracture with intramedullary implant Pain control; watch narcotics and sedating meds d/t lethergy Evaluating for inpatient rehab, if not will seek placement in SNF   Dizziness and fall Debility - Possibly secondary to hypotension. - echo: Left Ventricle: Left ventricular ejection fraction, by visual estimation, is 60 to 65%. The left ventricle has normal function. The left ventricle has no regional wall motion abnormalities. The left ventricular internal cavity size was the left  ventricle is normal in size. There is mildly increased left ventricular hypertrophy. Left ventricular diastolic parameters were normal.Right Ventricle: The right ventricular size is mildly enlarged. No increase in right ventricular wall thickness. Global RV systolic function is has normal systolic function. - negative trp  Hypotension Continue to hold home BP meds.   IDDM complicated by diabetic neuropathy- Lantus dose was  decreased 12/31/2018.  Sugar has been stable since then.  Patient not eating much as she sometimes refuses to eat. Continue  SSI Hemoglobin A1c 7.1  CBG (last 3)  Recent Labs    01/02/19 2101 01/03/19 0751 01/03/19 1135  GLUCAP 186* 157* 220*   Hyperlipidemia  - crestor  CKD IIIa - Baseline creatinine ranging between 1 to 1.4. currently its at0.73  Pyuria -UCxwith yeast -stop rocephin -Urine culture with candiduria.  Not treated.  HIV  - biktarvy  COPD and Asthma: Stable - flonase, breo ellipta - PRN albuterol, duonebs - CXR with possible early infection -d/c rocephin, zithro; transition to augmentin ( end 12/29) - Bipap d/t lethergy, hypoxia  Tobacco abuse:  - Cessation counseling provided.  - Start her on nicotine patch.   Left rib fracture:  - Pain control and incentive spirometry.   Lethargy-she is awake today.Patient needs CPAP at night. CT head stable atrophy and white matter disease. No acute intracranial abnormality.  Minimize narcotics.  Hyperkalemia potassium was 6.6 yesterday down to 5.5 today.  She was on potassium supplements which has been stopped.  Recheck labs in the morning.  Disposition she needs SNF.  Her family is not able to take care of her at home.  She has not received any bed offers.  Covid negative 12/30. Order placed 01/02/19 for CIR eval   Pressure Injury 12/26/18 Heel Right Stage 2 -  Partial thickness loss of dermis presenting as a shallow open injury with a red, pink wound bed without slough. Fluid Blister (Active)  12/26/18 0800  Location: Heel  Location Orientation: Right  Staging: Stage 2 -  Partial thickness loss of dermis presenting as a shallow open injury with a red, pink wound bed without slough.  Wound Description (Comments): Fluid Blister  Present on Admission:       Nutrition Problem: Increased nutrient needs Etiology: post-op healing     Signs/Symptoms: estimated needs    Interventions: Ensure Enlive (each supplement provides 350kcal and 20 grams of protein), MVI,  Juven  Estimated body mass index is 28.98 kg/m as calculated from the following:   Height as of this encounter: 5\' 10"  (1.778 m).   Weight as of this encounter: 91.6 kg.  DVT prophylaxis:lovenox Code Status:FULL Disposition Plan:Pending SNF Covid - 12/31/2018 consultants:  Ortho  Antimicrobials: Anti-infectives (From admission, onward)   Start     Dose/Rate Route Frequency Ordered Stop   12/29/18 1000  amoxicillin-clavulanate (AUGMENTIN) 875-125 MG per tablet 1 tablet     1 tablet Oral Every 12 hours 12/28/18 1443 12/30/18 2025   12/26/18 0800  cefTRIAXone (ROCEPHIN) 1 g in sodium chloride 0.9 % 100 mL IVPB  Status:  Discontinued     1 g 200 mL/hr over 30 Minutes Intravenous Every 24 hours 12/25/18 1355 12/28/18 1441   12/26/18 0630  azithromycin (ZITHROMAX) 500 mg in sodium chloride 0.9 % 250 mL IVPB  Status:  Discontinued     500 mg 250 mL/hr over 60 Minutes Intravenous Every 24 hours 12/26/18 0617 12/28/18 1441   12/26/18 0600  ceFAZolin (ANCEF) IVPB 2g/100 mL premix     2 g 200 mL/hr over 30 Minutes Intravenous On call to O.R. 12/25/18 1216 12/25/18 1330   12/25/18 1800  ceFAZolin (ANCEF) IVPB 2g/100 mL premix     2 g 200 mL/hr over 30 Minutes Intravenous Every 6 hours 12/25/18 1646 12/26/18 1159   12/25/18 1221  ceFAZolin (ANCEF) 2-4 GM/100ML-% IVPB    Note to Pharmacy: [facility]   :  cabinet override      12/25/18 1221 12/25/18 1330   12/25/18 1000  bictegravir-emtricitabine-tenofovir AF (BIKTARVY) 50-200-25 MG per tablet 1 tablet     1 tablet Oral Daily 12/24/18 1721          Subjective: She is resting in bed able to wake her up easily more awake than yesterday and day before.  Objective: Vitals:   01/02/19 2058 01/03/19 0643 01/03/19 0700 01/03/19 1335  BP: (!) 94/54 130/67  (!) 95/48  Pulse: 70 71 72 74  Resp: 14 14 16 16   Temp: 99.3 F (37.4 C) 98.9 F (37.2 C)  98.1 F (36.7 C)  TempSrc: Oral Oral  Oral  SpO2: 93% 99% 100%   Weight:   91.6 kg    Height:        Intake/Output Summary (Last 24 hours) at 01/03/2019 1557 Last data filed at 01/03/2019 1300 Gross per 24 hour  Intake 1450 ml  Output 2350 ml  Net -900 ml   Filed Weights   01/01/19 0447 01/02/19 0505 01/03/19 0643  Weight: 94.9 kg 92 kg 91.6 kg    Examination:  General exam: Appears calm and comfortable  Respiratory system: Clear to auscultation. Respiratory effort normal. Cardiovascular system: S1 & S2 heard, RRR. No JVD, murmurs, rubs, gallops or clicks. No pedal edema. Gastrointestinal system: Abdomen is nondistended, soft and nontender. No organomegaly or masses felt. Normal bowel sounds heard. Central nervous system: Alert and oriented. No focal neurological deficits. Extremities: Right hip incision covered with dressing.  Left BKA Skin: No rashes, lesions or ulcers Psychiatry: Judgement and insight normal. Mood & affect appropriate.     Data Reviewed: I have personally reviewed following labs and imaging studies  CBC: Recent Labs  Lab 12/28/18 0319 12/29/18 0329 12/30/18 0345 12/31/18 0406  WBC 9.8 9.4 9.4 10.6*  NEUTROABS 6.4 5.7 5.4 6.4  HGB 7.3* 7.0* 7.3* 8.7*  HCT 25.3* 23.6* 24.7* 28.7*  MCV 84.1 81.7 82.3 81.5  PLT 248 308 338 [id number]   Basic Metabolic Panel: Recent Labs  Lab 12/28/18 0319 12/29/18 0329 12/30/18 0345 12/31/18 1441 01/01/19 0510 01/02/19 0853 01/03/19 1400  NA 141 140 140 136 137 138 135  K 3.6 3.4* 4.8 6.6* 5.5* 5.4* 4.4  CL 107 104 103 98 98 97* 97*  CO2 28 28 28  33* 31 31 29   GLUCOSE 101* 66* 104* 117* 117* 195* 265*  BUN 12 10 10 11 13 19  26*  CREATININE 0.74 0.73 0.71 0.75 0.75 1.00 1.30*  CALCIUM 7.8* 7.7* 8.1* 8.4* 8.6* 8.8* 8.2*  MG 1.6* 1.7 1.5* 1.8  --   --   --   PHOS 2.1* 1.7* 2.3*  --   --   --   --    GFR: Estimated Creatinine Clearance: 60.7 mL/min (A) (by C-G formula based on SCr of 1.3 mg/dL (H)). Liver Function Tests: Recent Labs  Lab 12/28/18 0319 12/29/18 0329 12/30/18 0345    ALBUMIN 1.5* 1.4* 1.4*   No results for input(s): LIPASE, AMYLASE in the last 168 hours. Recent Labs  Lab 12/28/18 0319  AMMONIA 37*   Coagulation Profile: No results for input(s): INR, PROTIME in the last 168 hours. Cardiac Enzymes: No results for input(s): CKTOTAL, CKMB, CKMBINDEX, TROPONINI in the last 168 hours. BNP (last 3 results) No results for input(s): PROBNP in the last 8760 hours. HbA1C: No results for input(s): HGBA1C in the last 72 hours. CBG: Recent Labs  Lab 01/02/19 1139 01/02/19 1645 01/02/19 2101 01/03/19 [id number]  01/03/19 1135  GLUCAP 265* 231* 186* 157* 220*   Lipid Profile: No results for input(s): CHOL, HDL, LDLCALC, TRIG, CHOLHDL, LDLDIRECT in the last 72 hours. Thyroid Function Tests: No results for input(s): TSH, T4TOTAL, FREET4, T3FREE, THYROIDAB in the last 72 hours. Anemia Panel: No results for input(s): VITAMINB12, FOLATE, FERRITIN, TIBC, IRON, RETICCTPCT in the last 72 hours. Sepsis Labs: No results for input(s): PROCALCITON, LATICACIDVEN in the last 168 hours.  Recent Results (from the past 240 hour(s))  Surgical pcr screen     Status: None   Collection Time: 12/24/18 10:13 PM   Specimen: Nasal Mucosa; Nasal Swab  Result Value Ref Range Status   MRSA, PCR NEGATIVE NEGATIVE Final   Staphylococcus aureus NEGATIVE NEGATIVE Final    Comment: (NOTE) The Xpert SA Assay (FDA approved for NASAL specimens in patients 63 years of age and older), is one component of a comprehensive surveillance program. It is not intended to diagnose infection nor to guide or monitor treatment. Performed at [facility], [place] [address] [place], [place] [address]   Culture, Urine     Status: Abnormal   Collection Time: 12/25/18 10:30 PM   Specimen: Urine, Catheterized  Result Value Ref Range Status   Specimen Description URINE, CATHETERIZED  Final   Special Requests   Final    NONE Performed at [facility], [place] [address], [place], [place]  [address]    Culture >=100,000 COLONIES/mL YEAST (A)  Final   Report Status 12/27/2018 FINAL  Final  SARS CORONAVIRUS 2 (TAT 6-24 HRS) Nasopharyngeal Nasopharyngeal Swab     Status: None   Collection Time: 12/31/18 12:36 PM   Specimen: Nasopharyngeal Swab  Result Value Ref Range Status   SARS Coronavirus 2 NEGATIVE NEGATIVE Final    Comment: (NOTE) SARS-CoV-2 target nucleic acids are NOT DETECTED. The SARS-CoV-2 RNA is generally detectable in upper and lower respiratory specimens during the acute phase of infection. Negative results do not preclude SARS-CoV-2 infection, do not rule out co-infections with other pathogens, and should not be used as the sole basis for treatment or other patient management decisions. Negative results must be combined with clinical observations, patient history, and epidemiological information. The expected result is Negative. Fact Sheet for Patients: SugarRoll.be Fact Sheet for Healthcare Providers: https://www.woods-mathews.com/ This test is not yet approved or cleared by the [place] FDA and  has been authorized for detection and/or diagnosis of SARS-CoV-2 by FDA under an Emergency Use Authorization (EUA). This EUA will remain  in effect (meaning this test can be used) for the duration of the COVID-19 declaration under Section 56 4(b)(1) of the Act, 21 U.S.C. section 360bbb-3(b)(1), unless the authorization is terminated or revoked sooner. Performed at [facility], [place] [address] [place], [place] [address]          Radiology Studies: No results found.      Scheduled Meds: . bictegravir-emtricitabine-tenofovir AF  1 tablet Oral Daily  . chlorhexidine  15 mL Mouth/Throat QID  . docusate sodium  100 mg Oral BID  . enoxaparin (LOVENOX) injection  40 mg Subcutaneous Q24H  . feeding supplement (ENSURE ENLIVE)  237 mL Oral BID BM  . fluticasone  1 spray Each Nare Daily  . fluticasone  furoate-vilanterol  1 puff Inhalation Daily   And  . umeclidinium bromide  1 puff Inhalation Daily  . insulin aspart  0-5 Units Subcutaneous QHS  . insulin aspart  0-9 Units Subcutaneous TID WC  . insulin glargine  10 Units Subcutaneous QHS  . multivitamin with minerals  1 tablet Oral Daily  . nutrition supplement (JUVEN)  1 packet Oral BID BM  . pantoprazole  40 mg Oral Daily  . pregabalin  225 mg Oral BID  . rosuvastatin  20 mg Oral Daily  . sertraline  150 mg Oral Daily   Continuous Infusions: . methocarbamol (ROBAXIN) IV    . sodium chloride       LOS: 10 days   Time spent 25 mins    Emeterio Reeve, DO [facility]   01/03/2019, 3:57 PM

## 2019-01-04 LAB — GLUCOSE, CAPILLARY
Glucose-Capillary: 178 mg/dL — ABNORMAL HIGH (ref 70–99)
Glucose-Capillary: 244 mg/dL — ABNORMAL HIGH (ref 70–99)
Glucose-Capillary: 307 mg/dL — ABNORMAL HIGH (ref 70–99)
Glucose-Capillary: 340 mg/dL — ABNORMAL HIGH (ref 70–99)

## 2019-01-04 NOTE — Progress Notes (Signed)
PROGRESS NOTE    [name]  [id number] DOB: Jul 28, 1965 DOA: 12/24/2018 PCP: [name], PA-C    Brief Narrative:54 y.o.femalewith medical history significant ofasthma, COPD, stage IV CKD, morbid obesity, depression, anxiety, hypertension, insulin-dependent diabetes mellitus, left-sided BKA, diabetic neuropathy, GERD, HIV, hyperlipidemia was at home today and was trying to get up from the commode reports feeling dizzy and fell on her right side. She reports pain on the right hip. On arrival to ED she underwent x-rays of the right hip and was found to have a Possible left pelvic bone fractures. Probable intertrochanteric fracture of the proximal right femur. As per the patient, she denied hitting her head or syncope. She reports she was trying to get up from the commode after BM, felt dizzy and fell on the right side. She denies any chest pain or sob, fever, chills, nausea, vomiting , abdominal pain, headache, tingling or numbness, diarrhea, dysuria, hematochezia, or hematemesis. She denies any blurry vision at this time.  Orthopedics consulted and she was referred to medical service for admission  Admitted 12/24/18 as above  12/25/18: surgery -Open treatment of intertrochanteric fracture with intramedullary implant 12/26/18: POD1 doing ok from surgical standpoint. More lethargic ON and AM. Moved to progressive and placed on BiPap. Ammonia only mildly elevated. Fevers noted. UA weakly positive and CXR suspicious for early infection. Rocephin, zithro started. Continue for now. Repeat ABG closer to shift change. She is a little more alert now. 12/27/18: was able to move down to 3L. Opens eyes and follows some commands, however, she is still overall lethargic this AM. Will check a CTH. Repeat ammonia.  12/28/18: More alert and interactive this AM. CTH negative. She is doing better. Advance diet. Replete lytes 12/29/18: Alert today. Spoke with son. Difficult situation at home. She  is not mobile and takes pain meds all day. They are unable to take care of her and she seems unwilling to take care of her self.  12/30/18: nothing much new!  12/31/18: PT recommended SNF 01/01/19: Disposition she needs SNF, no bed offers.  Her family is not able to take care of her at home.  She has not received any bed offers.  Covid negative 12/30 01/02/19: pt requesting eval for inpatient rehab. Her goal is to get stronger so she can help care for her grandkids.  01/03/19, we are awaiting reevaluation by PT re: appropriate for CIR  01/04/19 today: same as yesterday!     Assessment & Plan:   Principal Problem:   Displaced intertrochanteric fracture of right femur, initial encounter for closed fracture [facility]) Active Problems:   Pressure injury of skin   Intertrochanteric fracture of the proximal right femur: OK from surgical standpoint s/pOpen treatment of intertrochanteric fracture with intramedullary implant Pain control; watch narcotics and sedating meds d/t lethergy Evaluating for inpatient rehab, if not will seek placement in SNF   Dizziness and fall Debility - Possibly secondary to hypotension. - echo: Left Ventricle: Left ventricular ejection fraction, by visual estimation, is 60 to 65%. The left ventricle has normal function. The left ventricle has no regional wall motion abnormalities. The left ventricular internal cavity size was the left  ventricle is normal in size. There is mildly increased left ventricular hypertrophy. Left ventricular diastolic parameters were normal.Right Ventricle: The right ventricular size is mildly enlarged. No increase in right ventricular wall thickness. Global RV systolic function is has normal systolic function. - negative trp  Hypotension Continue to hold home BP meds.   IDDM complicated by  diabetic neuropathy- Lantus dose was decreased 12/31/2018.  Sugar has been stable since then.  Patient not eating much as she  sometimes refuses to eat. Continue SSI Hemoglobin A1c 7.1  CBG (last 3)  Recent Labs    01/03/19 2104 01/04/19 0727 01/04/19 1101  GLUCAP 227* 178* 244*   Hyperlipidemia  - crestor  CKD IIIa - Baseline creatinine ranging between 1 to 1.4. most recently it's at0.73  Pyuria -UCxwith yeast -stop rocephin -Urine culture with candiduria.  Not treated.  HIV  - biktarvy  COPD and Asthma: Stable - flonase, breo ellipta - PRN albuterol, duonebs - CXR with possible early infection -d/c rocephin, zithro; transition to augmentin ( end 12/29) - Bipap d/t lethergy, hypoxia  Tobacco abuse:  - Cessation counseling provided.  - on nicotine patch.   Left rib fracture:  - Pain control and incentive spirometry.   Lethargy-she is awake today.Patient needs CPAP at night. CT head stable atrophy and white matter disease. No acute intracranial abnormality.  Minimize narcotics.  Hyperkalemia was normal yesterday at 4.4.  She was on potassium supplements which has been stopped.  Recheck labs tomorrow to ensure stability and monitor renal fxn.  Disposition she needs SNF.  Her family is not able to take care of her at home.  She has not received any bed offers.  Covid negative 12/30. Order placed 01/02/19 for CIR eval   Pressure Injury 12/26/18 Heel Right Stage 2 -  Partial thickness loss of dermis presenting as a shallow open injury with a red, pink wound bed without slough. Fluid Blister (Active)  12/26/18 0800  Location: Heel  Location Orientation: Right  Staging: Stage 2 -  Partial thickness loss of dermis presenting as a shallow open injury with a red, pink wound bed without slough.  Wound Description (Comments): Fluid Blister  Present on Admission:       Nutrition Problem: Increased nutrient needs Etiology: post-op healing     Signs/Symptoms: estimated needs    Interventions: Ensure Enlive (each supplement  provides 350kcal and 20 grams of protein), MVI, Juven  Estimated body mass index is 28.98 kg/m as calculated from the following:   Height as of this encounter: 5\' 10"  (1.778 m).   Weight as of this encounter: 91.6 kg.  DVT prophylaxis:lovenox Code Status:FULL Disposition Plan:Pending SNF Covid - 12/31/2018 consultants:  Ortho  Antimicrobials: Anti-infectives (From admission, onward)   Start     Dose/Rate Route Frequency Ordered Stop   12/29/18 1000  amoxicillin-clavulanate (AUGMENTIN) 875-125 MG per tablet 1 tablet     1 tablet Oral Every 12 hours 12/28/18 1443 12/30/18 2025   12/26/18 0800  cefTRIAXone (ROCEPHIN) 1 g in sodium chloride 0.9 % 100 mL IVPB  Status:  Discontinued     1 g 200 mL/hr over 30 Minutes Intravenous Every 24 hours 12/25/18 1355 12/28/18 1441   12/26/18 0630  azithromycin (ZITHROMAX) 500 mg in sodium chloride 0.9 % 250 mL IVPB  Status:  Discontinued     500 mg 250 mL/hr over 60 Minutes Intravenous Every 24 hours 12/26/18 0617 12/28/18 1441   12/26/18 0600  ceFAZolin (ANCEF) IVPB 2g/100 mL premix     2 g 200 mL/hr over 30 Minutes Intravenous On call to O.R. 12/25/18 1216 12/25/18 1330   12/25/18 1800  ceFAZolin (ANCEF) IVPB 2g/100 mL premix     2 g 200 mL/hr over 30 Minutes Intravenous Every 6 hours 12/25/18 1646 12/26/18 1159   12/25/18 1221  ceFAZolin (ANCEF) 2-4 GM/100ML-% IVPB  Note to Pharmacy: [facility]   : cabinet override      12/25/18 1221 12/25/18 1330   12/25/18 1000  bictegravir-emtricitabine-tenofovir AF (BIKTARVY) 50-200-25 MG per tablet 1 tablet     1 tablet Oral Daily 12/24/18 1721          Subjective: She is resting in bed able to wake her up easily. She has no complaints other than want update on CIR, I advised SW is following..   Objective: Vitals:   01/03/19 1335 01/03/19 2058 01/04/19 0635 01/04/19 0810  BP: (!) 95/48 (!) 124/47 (!) 131/58   Pulse: 74 73 73 69  Resp: 16 15  16   Temp: 98.1 F (36.7 C) 99.1  F (37.3 C) 98.4 F (36.9 C)   TempSrc: Oral Oral Oral   SpO2:  98%  96%  Weight:   91.6 kg   Height:        Intake/Output Summary (Last 24 hours) at 01/04/2019 1341 Last data filed at 01/04/2019 1300 Gross per 24 hour  Intake 1070 ml  Output 900 ml  Net 170 ml   Filed Weights   01/02/19 0505 01/03/19 0643 01/04/19 0635  Weight: 92 kg 91.6 kg 91.6 kg    Examination:  General exam: Appears calm and comfortable  Respiratory system: Clear to auscultation. Respiratory effort normal. Cardiovascular system: S1 & S2 heard, RRR. No JVD, murmurs, rubs, gallops or clicks. No pedal edema. Gastrointestinal system: Abdomen is nondistended, soft and nontender. No organomegaly or masses felt. Normal bowel sounds heard. Central nervous system: Alert and oriented. No focal neurological deficits. Extremities: Right hip incision covered with dressing.  Left BKA Skin: No rashes, lesions or ulcers Psychiatry: Judgement and insight normal. Mood & affect appropriate.     Data Reviewed: I have personally reviewed following labs and imaging studies  CBC: Recent Labs  Lab 12/29/18 0329 12/30/18 0345 12/31/18 0406  WBC 9.4 9.4 10.6*  NEUTROABS 5.7 5.4 6.4  HGB 7.0* 7.3* 8.7*  HCT 23.6* 24.7* 28.7*  MCV 81.7 82.3 81.5  PLT 308 338 [id number]   Basic Metabolic Panel: Recent Labs  Lab 12/29/18 0329 12/30/18 0345 12/31/18 1441 01/01/19 0510 01/02/19 0853 01/03/19 1400  NA 140 140 136 137 138 135  K 3.4* 4.8 6.6* 5.5* 5.4* 4.4  CL 104 103 98 98 97* 97*  CO2 28 28 33* 31 31 29   GLUCOSE 66* 104* 117* 117* 195* 265*  BUN 10 10 11 13 19  26*  CREATININE 0.73 0.71 0.75 0.75 1.00 1.30*  CALCIUM 7.7* 8.1* 8.4* 8.6* 8.8* 8.2*  MG 1.7 1.5* 1.8  --   --   --   PHOS 1.7* 2.3*  --   --   --   --    GFR: Estimated Creatinine Clearance: 60.7 mL/min (A) (by C-G formula based on SCr of 1.3 mg/dL (H)). Liver Function Tests: Recent Labs  Lab 12/29/18 0329 12/30/18 0345  ALBUMIN 1.4* 1.4*   No results  for input(s): LIPASE, AMYLASE in the last 168 hours. No results for input(s): AMMONIA in the last 168 hours. Coagulation Profile: No results for input(s): INR, PROTIME in the last 168 hours. Cardiac Enzymes: No results for input(s): CKTOTAL, CKMB, CKMBINDEX, TROPONINI in the last 168 hours. BNP (last 3 results) No results for input(s): PROBNP in the last 8760 hours. HbA1C: No results for input(s): HGBA1C in the last 72 hours. CBG: Recent Labs  Lab 01/03/19 1135 01/03/19 1611 01/03/19 2104 01/04/19 0727 01/04/19 1101  GLUCAP 220* 287* 227*  178* 244*   Lipid Profile: No results for input(s): CHOL, HDL, LDLCALC, TRIG, CHOLHDL, LDLDIRECT in the last 72 hours. Thyroid Function Tests: No results for input(s): TSH, T4TOTAL, FREET4, T3FREE, THYROIDAB in the last 72 hours. Anemia Panel: No results for input(s): VITAMINB12, FOLATE, FERRITIN, TIBC, IRON, RETICCTPCT in the last 72 hours. Sepsis Labs: No results for input(s): PROCALCITON, LATICACIDVEN in the last 168 hours.  Recent Results (from the past 240 hour(s))  Culture, Urine     Status: Abnormal   Collection Time: 12/25/18 10:30 PM   Specimen: Urine, Catheterized  Result Value Ref Range Status   Specimen Description URINE, CATHETERIZED  Final   Special Requests   Final    NONE Performed at [facility], [address] [place], [place] [address]    Culture >=100,000 COLONIES/mL YEAST (A)  Final   Report Status 12/27/2018 FINAL  Final  SARS CORONAVIRUS 2 (TAT 6-24 HRS) Nasopharyngeal Nasopharyngeal Swab     Status: None   Collection Time: 12/31/18 12:36 PM   Specimen: Nasopharyngeal Swab  Result Value Ref Range Status   SARS Coronavirus 2 NEGATIVE NEGATIVE Final    Comment: (NOTE) SARS-CoV-2 target nucleic acids are NOT DETECTED. The SARS-CoV-2 RNA is generally detectable in upper and lower respiratory specimens during the acute phase of infection. Negative results do not preclude SARS-CoV-2 infection, do not rule  out co-infections with other pathogens, and should not be used as the sole basis for treatment or other patient management decisions. Negative results must be combined with clinical observations, patient history, and epidemiological information. The expected result is Negative. Fact Sheet for Patients: SugarRoll.be Fact Sheet for Healthcare Providers: https://www.woods-mathews.com/ This test is not yet approved or cleared by the [place] FDA and  has been authorized for detection and/or diagnosis of SARS-CoV-2 by FDA under an Emergency Use Authorization (EUA). This EUA will remain  in effect (meaning this test can be used) for the duration of the COVID-19 declaration under Section 56 4(b)(1) of the Act, 21 U.S.C. section 360bbb-3(b)(1), unless the authorization is terminated or revoked sooner. Performed at [facility], [place] [address] [place], [place] [address]          Radiology Studies: No results found.      Scheduled Meds: . bictegravir-emtricitabine-tenofovir AF  1 tablet Oral Daily  . chlorhexidine  15 mL Mouth/Throat QID  . docusate sodium  100 mg Oral BID  . enoxaparin (LOVENOX) injection  40 mg Subcutaneous Q24H  . feeding supplement (ENSURE ENLIVE)  237 mL Oral BID BM  . fluticasone  1 spray Each Nare Daily  . fluticasone furoate-vilanterol  1 puff Inhalation Daily   And  . umeclidinium bromide  1 puff Inhalation Daily  . insulin aspart  0-5 Units Subcutaneous QHS  . insulin aspart  0-9 Units Subcutaneous TID WC  . insulin glargine  10 Units Subcutaneous QHS  . multivitamin with minerals  1 tablet Oral Daily  . nutrition supplement (JUVEN)  1 packet Oral BID BM  . pantoprazole  40 mg Oral Daily  . pregabalin  225 mg Oral BID  . rosuvastatin  20 mg Oral Daily  . sertraline  150 mg Oral Daily   Continuous Infusions: . methocarbamol (ROBAXIN) IV    . sodium chloride       LOS: 11 days   Time spent 25  mins    Emeterio Reeve, DO [facility]   01/04/2019, 1:41 PM

## 2019-01-05 LAB — GLUCOSE, CAPILLARY
Glucose-Capillary: 252 mg/dL — ABNORMAL HIGH (ref 70–99)
Glucose-Capillary: 281 mg/dL — ABNORMAL HIGH (ref 70–99)
Glucose-Capillary: 302 mg/dL — ABNORMAL HIGH (ref 70–99)
Glucose-Capillary: 248 mg/dL — ABNORMAL HIGH (ref 70–99)

## 2019-01-05 NOTE — Plan of Care (Signed)
  Problem: Education: Goal: Knowledge of General Education information will improve Description: Including pain rating scale, medication(s)/side effects and non-pharmacologic comfort measures Outcome: Progressing   Problem: Clinical Measurements: Goal: Respiratory complications will improve Outcome: Progressing   Problem: Nutrition: Goal: Adequate nutrition will be maintained Outcome: Progressing   Problem: Coping: Goal: Level of anxiety will decrease Outcome: Progressing   Problem: Elimination: Goal: Will not experience complications related to urinary retention Outcome: Progressing   Problem: Pain Managment: Goal: General experience of comfort will improve Outcome: Progressing   Problem: Safety: Goal: Ability to remain free from injury will improve Outcome: Progressing   Problem: Pain Management: Goal: Pain level will decrease Outcome: Progressing

## 2019-01-05 NOTE — TOC Progression Note (Addendum)
Transition of Care [facility]) - Progression Note    Patient Details  Name: [name] MRN: [id number] Date of Birth: Apr 17, 1965  Transition of [facility]) CM/SW [place], [place] Phone Number: 01/05/2019, 1:09 PM  Clinical Narrative:     UPDATE: [facility] was the patient's only bed offer- [facility] states they do not have any availability today- maybe tomorrow. Contacted [place] and [place] too- waiting on response.  CSW met with patient at bedside. Patient states se is agreeable to ST rehab at [facility].   CSW sent notice to [facility] to confirm bed offer. CSW waiting on response.  Thurmond Butts, MSW, [place] Clinical Social Worker   Expected Discharge Plan: Skilled Nursing Facility Barriers to Discharge: Continued Medical Work up  Expected Discharge Plan and Services Expected Discharge Plan: [place] In-house Referral: Clinical Social Work   Post Acute Care Choice: [place] Living arrangements for the past 2 months: [place]                                       Social Determinants of Health (SDOH) Interventions    Readmission Risk Interventions Readmission Risk Prevention Plan 06/17/2018  Transportation Screening Complete  Medication Review Press photographer) Complete  PCP or Specialist appointment within 3-5 days of discharge Complete  HRI or Home Care Consult Complete  SW Recovery Care/Counseling Consult Complete  Palliative Care Screening Not [place] Complete  Some recent data might be hidden

## 2019-01-05 NOTE — Progress Notes (Signed)
Inpatient Rehabilitation Admissions Coordinator  Inpatient rehab consult received. I met with pt at bedside for rehab assessment. Pt not at a level to tolerate the intensity of an inpt rehab admit nor will [facility] Medicare approve for inpt rehab for this diagnosis. I recommend SNF and pt is aware. RN CM, Kristi, made aware. SW not assigned to care team to be notified. We will sign off at this time.  Danne Baxter, RN, MSN Rehab Admissions Coordinator [phone] 01/05/2019 10:17 AM

## 2019-01-05 NOTE — Care Management Important Message (Signed)
Important Message  Patient Details  Name: [name] MRN: [id number] Date of Birth: May 01, 1965   Medicare Important Message Given:  Yes     [name] 01/05/2019, 2:30 PM

## 2019-01-05 NOTE — Progress Notes (Addendum)
Inpatient Diabetes Program Recommendations  AACE/ADA: New Consensus Statement on Inpatient Glycemic Control   Target Ranges:  Prepandial:   less than 140 mg/dL      Peak postprandial:   less than 180 mg/dL (1-2 hours)      Critically ill patients:  140 - 180 mg/dL   Results for [name], [name] (MRN [id number]) as of 01/05/2019 13:54  Ref. Range 01/04/2019 07:27 01/04/2019 11:01 01/04/2019 16:16 01/04/2019 21:24 01/05/2019 07:21 01/05/2019 11:41  Glucose-Capillary Latest Ref Range: 70 - 99 mg/dL 178 (H) 244 (H) 307 (H) 340 (H) 252 (H) 281 (H)    Review of Glycemic Control  Diabetes history: DM2 Outpatient Diabetes medications: Humulin R U500 40 units BID, Toujeo 50 units BID, Jardiance 10 mg daily Current orders for Inpatient glycemic control: Lantus 10 units QHS, Novolog 0-9 units TID with meals, Novolog 0-5 units QHS  Inpatient Diabetes Program Recommendations:     Insulin-Basal: Please consider increasing Lantus to 12 units QHS.  Insulin-Meal Coverage: Please consider ordering Novolog 4 units TID with meals for meal coverage if patient eats at least 50% of meals.  Thanks, Barnie Alderman, RN, MSN, CDE Diabetes Coordinator Inpatient Diabetes Program [phone] (Team Pager from 8am to 5pm)

## 2019-01-05 NOTE — Progress Notes (Addendum)
Pre-rounds chart review: LOS: 12  Overnight: no events per chart Pending: placement  Appreciate recs from SW re:  . Confirm placement plan - I think we needed PT to evaluate for CIR? Will follow up on this today.   Dispo: medically, patient is ok to go to CIR/SNF when bed available     PROGRESS NOTE    [name]  [id number] DOB: [birth year] DOA: 12/24/2018 PCP: [name], PA-C    Brief Narrative:54 y.o.femalewith medical history significant ofasthma, COPD, stage IV CKD, morbid obesity, depression, anxiety, hypertension, insulin-dependent diabetes mellitus, left-sided BKA, diabetic neuropathy, GERD, HIV, hyperlipidemia was at home today and was trying to get up from the commode reports feeling dizzy and fell on her right side. She reports pain on the right hip. On arrival to ED she underwent x-rays of the right hip and was found to have a Possible left pelvic bone fractures. Probable intertrochanteric fracture of the proximal right femur. As per the patient, she denied hitting her head or syncope. She reports she was trying to get up from the commode after BM, felt dizzy and fell on the right side. She denies any chest pain or sob, fever, chills, nausea, vomiting , abdominal pain, headache, tingling or numbness, diarrhea, dysuria, hematochezia, or hematemesis. She denies any blurry vision at this time.  Orthopedics consulted and she was referred to medical service for admission  Admitted 12/24/18 as above  12/25/18: surgery -Open treatment of intertrochanteric fracture with intramedullary implant 12/26/18: POD1 doing ok from surgical standpoint. More lethargic ON and AM. Moved to progressive and placed on BiPap. Ammonia only mildly elevated. Fevers noted. UA weakly positive and CXR suspicious for early infection. Rocephin, zithro started. Continue for now. Repeat ABG closer to shift change. She is a little more alert now. 12/27/18: was able to move down to 3L. Opens  eyes and follows some commands, however, she is still overall lethargic this AM. Will check a CTH. Repeat ammonia.  12/28/18: More alert and interactive this AM. CTH negative. She is doing better. Advance diet. Replete lytes 12/29/18: Alert today. Spoke with son. Difficult situation at home. She is not mobile and takes pain meds all day. They are unable to take care of her and she seems unwilling to take care of her self.  12/30/18: nothing much new!  12/31/18: PT recommended SNF 01/01/19: Disposition she needs SNF, no bed offers.  Her family is not able to take care of her at home.  She has not received any bed offers.  Covid negative 12/30 01/02/19: pt requesting eval for inpatient rehab. Her goal is to get stronger so she can help care for her grandkids.  01/03/19, we are awaiting reevaluation by PT re: appropriate for CIR  01/04/19: same as yesterday!  Today 01/05/19: also same! CIR has evaluated this patient and recommended SNF    Assessment & Plan:   Principal Problem:   Displaced intertrochanteric fracture of right femur, initial encounter for closed fracture [facility]) Active Problems:   Pressure injury of skin   Intertrochanteric fracture of the proximal right femur: OK from surgical standpoint s/pOpen treatment of intertrochanteric fracture with intramedullary implant Pain control; watch narcotics and sedating meds d/t lethergy Evaluating for inpatient rehab, if not will seek placement in SNF   Dizziness and fall Debility - Possibly secondary to hypotension. - echo: Left Ventricle: Left ventricular ejection fraction, by visual estimation, is 60 to 65%. The left ventricle has normal function. The left ventricle has no regional wall motion  abnormalities. The left ventricular internal cavity size was the left  ventricle is normal in size. There is mildly increased left ventricular hypertrophy. Left ventricular diastolic parameters were normal.Right Ventricle: The right  ventricular size is mildly enlarged. No increase in right ventricular wall thickness. Global RV systolic function is has normal systolic function. - negative trp  Hypotension Continue to hold home BP meds.   IDDM complicated by diabetic neuropathy- Lantus dose was decreased 12/31/2018.  Sugar has been stable since then.  Patient not eating much as she sometimes refuses to eat. Continue SSI Hemoglobin A1c 7.1  CBG (last 3)  Recent Labs    01/04/19 2124 01/05/19 0721 01/05/19 1141  GLUCAP 340* 252* 281*   Hyperlipidemia  - crestor  CKD IIIa - Baseline creatinine ranging between 1 to 1.4. most recently it's at0.73  Pyuria -UCxwith yeast -stop rocephin -Urine culture with candiduria.  Not treated.  HIV  - biktarvy  COPD and Asthma: Stable - flonase, breo ellipta - PRN albuterol, duonebs - CXR with possible early infection -d/c rocephin, zithro; transition to augmentin ( end 12/29) - Bipap prn d/t lethergy, hypoxia  Tobacco abuse:  - Cessation counseling provided.  - on nicotine patch.   Left rib fracture:  - Pain control and incentive spirometry.   Lethargy-she is awake today.Patient needs CPAP at night. CT head stable atrophy and white matter disease. No acute intracranial abnormality.  Minimize narcotics.  Hyperkalemia was normal 2 days ago at 4.4.  She was on potassium supplements which has been stopped.  Will recheck BMP in AM to ensure stable   Disposition she needs SNF.  Her family is not able to take care of her at home.  She has not received any bed offers.  Covid negative 12/30. Order placed 01/02/19 for CIR eval   Pressure Injury 12/26/18 Heel Right Stage 2 -  Partial thickness loss of dermis presenting as a shallow open injury with a red, pink wound bed without slough. Fluid Blister (Active)  12/26/18 0800  Location: Heel  Location Orientation: Right  Staging: Stage 2 -   Partial thickness loss of dermis presenting as a shallow open injury with a red, pink wound bed without slough.  Wound Description (Comments): Fluid Blister  Present on Admission:       Nutrition Problem: Increased nutrient needs Etiology: post-op healing     Signs/Symptoms: estimated needs    Interventions: Ensure Enlive (each supplement provides 350kcal and 20 grams of protein), MVI, Juven  Estimated body mass index is 28.91 kg/m as calculated from the following:   Height as of this encounter: 5\' 10"  (1.778 m).   Weight as of this encounter: 91.4 kg.  DVT prophylaxis:lovenox Code Status:FULL Disposition Plan:Pending SNF Covid - 12/31/2018   Consultants:  Ortho  Antimicrobials: Anti-infectives (From admission, onward)   Start     Dose/Rate Route Frequency Ordered Stop   12/29/18 1000  amoxicillin-clavulanate (AUGMENTIN) 875-125 MG per tablet 1 tablet     1 tablet Oral Every 12 hours 12/28/18 1443 12/30/18 2025   12/26/18 0800  cefTRIAXone (ROCEPHIN) 1 g in sodium chloride 0.9 % 100 mL IVPB  Status:  Discontinued     1 g 200 mL/hr over 30 Minutes Intravenous Every 24 hours 12/25/18 1355 12/28/18 1441   12/26/18 0630  azithromycin (ZITHROMAX) 500 mg in sodium chloride 0.9 % 250 mL IVPB  Status:  Discontinued     500 mg 250 mL/hr over 60 Minutes Intravenous Every 24 hours 12/26/18 0617 12/28/18 1441  12/26/18 0600  ceFAZolin (ANCEF) IVPB 2g/100 mL premix     2 g 200 mL/hr over 30 Minutes Intravenous On call to O.R. 12/25/18 1216 12/25/18 1330   12/25/18 1800  ceFAZolin (ANCEF) IVPB 2g/100 mL premix     2 g 200 mL/hr over 30 Minutes Intravenous Every 6 hours 12/25/18 1646 12/26/18 1159   12/25/18 1221  ceFAZolin (ANCEF) 2-4 GM/100ML-% IVPB    Note to Pharmacy: [facility]   : cabinet override      12/25/18 1221 12/25/18 1330   12/25/18 1000  bictegravir-emtricitabine-tenofovir AF (BIKTARVY) 50-200-25 MG per tablet 1 tablet     1 tablet Oral Daily  12/24/18 1721          Subjective: She is resting in bed able to wake her up easily. She has no complaints other than want update on CIR, I advised SW is following..   Objective: Vitals:   01/04/19 1800 01/04/19 2128 01/05/19 0629 01/05/19 1509  BP:  (!) 113/51 99/72 (!) 106/59  Pulse: 64 65 75 67  Resp: 17 15 16 18   Temp:  98.5 F (36.9 C) 98.3 F (36.8 C) 98.8 F (37.1 C)  TempSrc:  Oral Oral Oral  SpO2: 98% 98% 96% 97%  Weight:   91.4 kg   Height:        Intake/Output Summary (Last 24 hours) at 01/05/2019 1551 Last data filed at 01/05/2019 0600 Gross per 24 hour  Intake 240 ml  Output --  Net 240 ml   Filed Weights   01/03/19 0643 01/04/19 0635 01/05/19 0629  Weight: 91.6 kg 91.6 kg 91.4 kg    Examination:  General exam: Appears calm and comfortable  Respiratory system: Clear to auscultation. Respiratory effort normal. Cardiovascular system: S1 & S2 heard, RRR. No JVD, murmurs, rubs, gallops or clicks. No pedal edema. Gastrointestinal system: Abdomen is nondistended, soft and nontender. No organomegaly or masses felt. Normal bowel sounds heard. Central nervous system: Alert and oriented. No focal neurological deficits. Extremities: Right hip incision covered with dressing.  Left BKA Skin: No rashes, lesions or ulcers Psychiatry: Judgement and insight normal. Mood & affect appropriate.     Data Reviewed: I have personally reviewed following labs and imaging studies  CBC: Recent Labs  Lab 12/30/18 0345 12/31/18 0406  WBC 9.4 10.6*  NEUTROABS 5.4 6.4  HGB 7.3* 8.7*  HCT 24.7* 28.7*  MCV 82.3 81.5  PLT 338 [id number]   Basic Metabolic Panel: Recent Labs  Lab 12/30/18 0345 12/31/18 1441 01/01/19 0510 01/02/19 0853 01/03/19 1400  NA 140 136 137 138 135  K 4.8 6.6* 5.5* 5.4* 4.4  CL 103 98 98 97* 97*  CO2 28 33* 31 31 29   GLUCOSE 104* 117* 117* 195* 265*  BUN 10 11 13 19  26*  CREATININE 0.71 0.75 0.75 1.00 1.30*  CALCIUM 8.1* 8.4* 8.6* 8.8* 8.2*  MG  1.5* 1.8  --   --   --   PHOS 2.3*  --   --   --   --    GFR: Estimated Creatinine Clearance: 60.7 mL/min (A) (by C-G formula based on SCr of 1.3 mg/dL (H)). Liver Function Tests: Recent Labs  Lab 12/30/18 0345  ALBUMIN 1.4*   No results for input(s): LIPASE, AMYLASE in the last 168 hours. No results for input(s): AMMONIA in the last 168 hours. Coagulation Profile: No results for input(s): INR, PROTIME in the last 168 hours. Cardiac Enzymes: No results for input(s): CKTOTAL, CKMB, CKMBINDEX, TROPONINI in the last  168 hours. BNP (last 3 results) No results for input(s): PROBNP in the last 8760 hours. HbA1C: No results for input(s): HGBA1C in the last 72 hours. CBG: Recent Labs  Lab 01/04/19 1101 01/04/19 1616 01/04/19 2124 01/05/19 0721 01/05/19 1141  GLUCAP 244* 307* 340* 252* 281*   Lipid Profile: No results for input(s): CHOL, HDL, LDLCALC, TRIG, CHOLHDL, LDLDIRECT in the last 72 hours. Thyroid Function Tests: No results for input(s): TSH, T4TOTAL, FREET4, T3FREE, THYROIDAB in the last 72 hours. Anemia Panel: No results for input(s): VITAMINB12, FOLATE, FERRITIN, TIBC, IRON, RETICCTPCT in the last 72 hours. Sepsis Labs: No results for input(s): PROCALCITON, LATICACIDVEN in the last 168 hours.  Recent Results (from the past 240 hour(s))  SARS CORONAVIRUS 2 (TAT 6-24 HRS) Nasopharyngeal Nasopharyngeal Swab     Status: None   Collection Time: 12/31/18 12:36 PM   Specimen: Nasopharyngeal Swab  Result Value Ref Range Status   SARS Coronavirus 2 NEGATIVE NEGATIVE Final    Comment: (NOTE) SARS-CoV-2 target nucleic acids are NOT DETECTED. The SARS-CoV-2 RNA is generally detectable in upper and lower respiratory specimens during the acute phase of infection. Negative results do not preclude SARS-CoV-2 infection, do not rule out co-infections with other pathogens, and should not be used as the sole basis for treatment or other patient management decisions. Negative  results must be combined with clinical observations, patient history, and epidemiological information. The expected result is Negative. Fact Sheet for Patients: SugarRoll.be Fact Sheet for Healthcare Providers: https://www.woods-mathews.com/ This test is not yet approved or cleared by the [place] FDA and  has been authorized for detection and/or diagnosis of SARS-CoV-2 by FDA under an Emergency Use Authorization (EUA). This EUA will remain  in effect (meaning this test can be used) for the duration of the COVID-19 declaration under Section 56 4(b)(1) of the Act, 21 U.S.C. section 360bbb-3(b)(1), unless the authorization is terminated or revoked sooner. Performed at [facility], [place] [address], [place], [place] [address]          Radiology Studies: No results found.      Scheduled Meds: . bictegravir-emtricitabine-tenofovir AF  1 tablet Oral Daily  . chlorhexidine  15 mL Mouth/Throat QID  . docusate sodium  100 mg Oral BID  . enoxaparin (LOVENOX) injection  40 mg Subcutaneous Q24H  . feeding supplement (ENSURE ENLIVE)  237 mL Oral BID BM  . fluticasone  1 spray Each Nare Daily  . fluticasone furoate-vilanterol  1 puff Inhalation Daily   And  . umeclidinium bromide  1 puff Inhalation Daily  . insulin aspart  0-5 Units Subcutaneous QHS  . insulin aspart  0-9 Units Subcutaneous TID WC  . insulin glargine  10 Units Subcutaneous QHS  . multivitamin with minerals  1 tablet Oral Daily  . nutrition supplement (JUVEN)  1 packet Oral BID BM  . pantoprazole  40 mg Oral Daily  . pregabalin  225 mg Oral BID  . rosuvastatin  20 mg Oral Daily  . sertraline  150 mg Oral Daily   Continuous Infusions: . methocarbamol (ROBAXIN) IV    . sodium chloride       LOS: 12 days   Time spent 25 mins     [facility]  Pager [phone]  or secure message in [place]

## 2019-01-06 LAB — GLUCOSE, CAPILLARY
Glucose-Capillary: 309 mg/dL — ABNORMAL HIGH (ref 70–99)
Glucose-Capillary: 222 mg/dL — ABNORMAL HIGH (ref 70–99)
Glucose-Capillary: 158 mg/dL — ABNORMAL HIGH (ref 70–99)
Glucose-Capillary: 182 mg/dL — ABNORMAL HIGH (ref 70–99)

## 2019-01-06 MED ORDER — INSULIN GLARGINE 100 UNIT/ML ~~LOC~~ SOLN
15.0000 [IU] | Freq: Every day | SUBCUTANEOUS | Status: DC
  Administered 2019-01-06 – 2019-01-07 (×2): 15 [IU] via SUBCUTANEOUS
  Filled 2019-01-06 (×3): qty 0.15

## 2019-01-06 MED ORDER — INSULIN ASPART 100 UNIT/ML ~~LOC~~ SOLN
5.0000 [IU] | Freq: Three times a day (TID) | SUBCUTANEOUS | Status: DC
  Administered 2019-01-06 – 2019-01-08 (×7): 5 [IU] via SUBCUTANEOUS

## 2019-01-06 MED ORDER — INSULIN ASPART 100 UNIT/ML ~~LOC~~ SOLN
0.0000 [IU] | Freq: Three times a day (TID) | SUBCUTANEOUS | Status: DC
  Administered 2019-01-06: 3 [IU] via SUBCUTANEOUS
  Administered 2019-01-06: 5 [IU] via SUBCUTANEOUS
  Administered 2019-01-07: 15 [IU] via SUBCUTANEOUS
  Administered 2019-01-07 (×2): 8 [IU] via SUBCUTANEOUS
  Administered 2019-01-08: 5 [IU] via SUBCUTANEOUS
  Administered 2019-01-08: 11 [IU] via SUBCUTANEOUS
  Administered 2019-01-08: 3 [IU] via SUBCUTANEOUS
  Administered 2019-01-09: 8 [IU] via SUBCUTANEOUS
  Administered 2019-01-09: 3 [IU] via SUBCUTANEOUS

## 2019-01-06 MED ORDER — INSULIN ASPART 100 UNIT/ML ~~LOC~~ SOLN: 5.0000 [IU] | Freq: Three times a day (TID) | SUBCUTANEOUS | Status: DC

## 2019-01-06 MED ORDER — INSULIN ASPART 100 UNIT/ML ~~LOC~~ SOLN
0.0000 [IU] | Freq: Every day | SUBCUTANEOUS | Status: DC
  Administered 2019-01-07: 22:00:00 2 [IU] via SUBCUTANEOUS

## 2019-01-06 MED ORDER — TRAZODONE HCL 50 MG PO TABS
50.0000 mg | ORAL_TABLET | Freq: Every evening | ORAL | Status: DC | PRN
  Administered 2019-01-06 – 2019-01-08 (×3): 50 mg via ORAL
  Filled 2019-01-06 (×3): qty 1

## 2019-01-06 NOTE — Progress Notes (Signed)
PROGRESS NOTE    [name]  [id number] DOB: 1965/07/12 DOA: 12/24/2018 PCP: [name], PA-C    Brief Narrative:54 y.o.femalewith medical history significant ofasthma, COPD, stage IV CKD, morbid obesity, depression, anxiety, hypertension, insulin-dependent diabetes mellitus, left-sided BKA, diabetic neuropathy, GERD, HIV, hyperlipidemia was at home today and was trying to get up from the commode reports feeling dizzy and fell on her right side. She reports pain on the right hip. On arrival to ED she underwent x-rays of the right hip and was found to have a Possible left pelvic bone fractures. Probable intertrochanteric fracture of the proximal right femur. As per the patient, she denied hitting her head or syncope. She reports she was trying to get up from the commode after BM, felt dizzy and fell on the right side. She denies any chest pain or sob, fever, chills, nausea, vomiting , abdominal pain, headache, tingling or numbness, diarrhea, dysuria, hematochezia, or hematemesis. She denies any blurry vision at this time.  Orthopedics consulted and she was referred to medical service for admission  Admitted 12/24/18 as above  12/25/18: surgery -Open treatment of intertrochanteric fracture with intramedullary implant 12/26/18: POD1 doing ok from surgical standpoint. More lethargic ON and AM. Moved to progressive and placed on BiPap. Ammonia only mildly elevated. Fevers noted. UA weakly positive and CXR suspicious for early infection. Rocephin, zithro started. Continue for now. Repeat ABG closer to shift change. She is a little more alert now. 12/27/18: was able to move down to 3L. Opens eyes and follows some commands, however, she is still overall lethargic this AM. Will check a CTH. Repeat ammonia.  12/28/18: More alert and interactive this AM. CTH negative. She is doing better. Advance diet. Replete lytes 12/29/18: Alert today. Spoke with son. Difficult situation at home. She  is not mobile and takes pain meds all day. They are unable to take care of her and she seems unwilling to take care of her self.  12/30/18: nothing much new!  12/31/18: PT recommended SNF 01/01/19: Disposition she needs SNF, no bed offers.  Her family is not able to take care of her at home.  She has not received any bed offers.  Covid negative 12/30 01/02/19: pt requesting eval for inpatient rehab. Her goal is to get stronger so she can help care for her grandkids.  01/03/19, we are awaiting reevaluation by PT re: appropriate for CIR  01/04/19: same as yesterday!  01/05/19: also same! CIR has evaluated this patient and recommended SNF 01/06/19 today: same as 01/05/19, I'm at least going to take her off telemetry     Assessment & Plan:   Principal Problem:   Displaced intertrochanteric fracture of right femur, initial encounter for closed fracture [facility]) Active Problems:   Pressure injury of skin   Intertrochanteric fracture of the proximal right femur: OK from surgical standpoint s/pOpen treatment of intertrochanteric fracture with intramedullary implant Pain control; watch narcotics and sedating meds d/t lethergy Evaluating for inpatient rehab, if not will seek placement in SNF   Dizziness and fall Debility - Possibly secondary to hypotension. - echo: Left Ventricle: Left ventricular ejection fraction, by visual estimation, is 60 to 65%. The left ventricle has normal function. The left ventricle has no regional wall motion abnormalities. The left ventricular internal cavity size was the left  ventricle is normal in size. There is mildly increased left ventricular hypertrophy. Left ventricular diastolic parameters were normal.Right Ventricle: The right ventricular size is mildly enlarged. No increase in right ventricular wall thickness.  Global RV systolic function is has normal systolic function. - negative trp  Hypotension Continue to hold home BP meds.   IDDM  complicated by diabetic neuropathy- Lantus dose was decreased 12/31/2018.  Sugar has been stable since then.  Patient not eating much as she sometimes refuses to eat. Continue SSI Hemoglobin A1c 7.1  CBG (last 3)  Recent Labs    01/06/19 0755 01/06/19 1118 01/06/19 1657  GLUCAP 309* 222* 158*   Hyperlipidemia  - crestor  CKD IIIa - Baseline creatinine ranging between 1 to 1.4. most recently it's at0.73  Pyuria -UCxwith yeast -stop rocephin -Urine culture with candiduria.  Not treated.  HIV  - biktarvy  COPD and Asthma: Stable - flonase, breo ellipta - PRN albuterol, duonebs - CXR with possible early infection -d/c rocephin, zithro; transition to augmentin ( end 12/29) - Bipap prn d/t lethergy, hypoxia  Tobacco abuse:  - Cessation counseling provided.  - on nicotine patch.   Left rib fracture:  - Pain control and incentive spirometry.   Lethargy-she is awake today.Patient needs CPAP at night. CT head stable atrophy and white matter disease. No acute intracranial abnormality.  Minimize narcotics.  Hyperkalemia was normal 2 days ago at 4.4.  She was on potassium supplements which has been stopped.  Will recheck BMP in AM to ensure stable   Disposition she needs SNF.  Her family is not able to take care of her at home.  She has not received any bed offers.  Covid negative 12/30. Order placed 01/02/19 for CIR eval and she was declined for CIR.   Pressure Injury 12/26/18 Heel Right Stage 2 -  Partial thickness loss of dermis presenting as a shallow open injury with a red, pink wound bed without slough. Fluid Blister (Active)  12/26/18 0800  Location: Heel  Location Orientation: Right  Staging: Stage 2 -  Partial thickness loss of dermis presenting as a shallow open injury with a red, pink wound bed without slough.  Wound Description (Comments): Fluid Blister  Present on Admission:       Nutrition  Problem: Increased nutrient needs Etiology: post-op healing     Signs/Symptoms: estimated needs    Interventions: Ensure Enlive (each supplement provides 350kcal and 20 grams of protein), MVI, Juven  Estimated body mass index is 27.9 kg/m as calculated from the following:   Height as of this encounter: 5\' 10"  (1.778 m).   Weight as of this encounter: 88.2 kg.  DVT prophylaxis:lovenox Code Status:FULL Disposition Plan:Pending SNF Covid - 12/31/2018   Consultants:  Ortho  Antimicrobials: Anti-infectives (From admission, onward)   Start     Dose/Rate Route Frequency Ordered Stop   12/29/18 1000  amoxicillin-clavulanate (AUGMENTIN) 875-125 MG per tablet 1 tablet     1 tablet Oral Every 12 hours 12/28/18 1443 12/30/18 2025   12/26/18 0800  cefTRIAXone (ROCEPHIN) 1 g in sodium chloride 0.9 % 100 mL IVPB  Status:  Discontinued     1 g 200 mL/hr over 30 Minutes Intravenous Every 24 hours 12/25/18 1355 12/28/18 1441   12/26/18 0630  azithromycin (ZITHROMAX) 500 mg in sodium chloride 0.9 % 250 mL IVPB  Status:  Discontinued     500 mg 250 mL/hr over 60 Minutes Intravenous Every 24 hours 12/26/18 0617 12/28/18 1441   12/26/18 0600  ceFAZolin (ANCEF) IVPB 2g/100 mL premix     2 g 200 mL/hr over 30 Minutes Intravenous On call to O.R. 12/25/18 1216 12/25/18 1330   12/25/18 1800  ceFAZolin (ANCEF)  IVPB 2g/100 mL premix     2 g 200 mL/hr over 30 Minutes Intravenous Every 6 hours 12/25/18 1646 12/26/18 1159   12/25/18 1221  ceFAZolin (ANCEF) 2-4 GM/100ML-% IVPB    Note to Pharmacy: [facility]   : cabinet override      12/25/18 1221 12/25/18 1330   12/25/18 1000  bictegravir-emtricitabine-tenofovir AF (BIKTARVY) 50-200-25 MG per tablet 1 tablet     1 tablet Oral Daily 12/24/18 1721          Subjective: She is resting in bed and has no complaints.   Objective: Vitals:   01/05/19 0629 01/05/19 1509 01/05/19 2109 01/06/19 0500  BP: 99/72 (!) 106/59 (!) 102/50  119/60  Pulse: 75 67 69 78  Resp: 16 18 17 15   Temp: 98.3 F (36.8 C) 98.8 F (37.1 C) 98.6 F (37 C) 99.2 F (37.3 C)  TempSrc: Oral Oral Oral Oral  SpO2: 96% 97% 99% 92%  Weight: 91.4 kg   88.2 kg  Height:        Intake/Output Summary (Last 24 hours) at 01/06/2019 1717 Last data filed at 01/06/2019 1600 Gross per 24 hour  Intake 680 ml  Output 700 ml  Net -20 ml   Filed Weights   01/04/19 0635 01/05/19 0629 01/06/19 0500  Weight: 91.6 kg 91.4 kg 88.2 kg    Examination:  General exam: Appears calm and comfortable  Respiratory system: Clear to auscultation. Respiratory effort normal. Cardiovascular system: S1 & S2 heard, RRR. No JVD, murmurs, rubs, gallops or clicks. No pedal edema. Gastrointestinal system: Abdomen is nondistended, soft and nontender. No organomegaly or masses felt. Normal bowel sounds heard. Central nervous system: Alert and oriented. No focal neurological deficits. Extremities: Right hip incision covered with dressing.  Left BKA Skin: No rashes, lesions or ulcers Psychiatry: Judgement and insight normal. Mood & affect appropriate.     Data Reviewed: I have personally reviewed following labs and imaging studies  CBC: Recent Labs  Lab 12/31/18 0406  WBC 10.6*  NEUTROABS 6.4  HGB 8.7*  HCT 28.7*  MCV 81.5  PLT [id number]   Basic Metabolic Panel: Recent Labs  Lab 12/31/18 1441 01/01/19 0510 01/02/19 0853 01/03/19 1400  NA 136 137 138 135  K 6.6* 5.5* 5.4* 4.4  CL 98 98 97* 97*  CO2 33* 31 31 29   GLUCOSE 117* 117* 195* 265*  BUN 11 13 19  26*  CREATININE 0.75 0.75 1.00 1.30*  CALCIUM 8.4* 8.6* 8.8* 8.2*  MG 1.8  --   --   --    GFR: Estimated Creatinine Clearance: 59.7 mL/min (A) (by C-G formula based on SCr of 1.3 mg/dL (H)). Liver Function Tests: No results for input(s): AST, ALT, ALKPHOS, BILITOT, PROT, ALBUMIN in the last 168 hours. No results for input(s): LIPASE, AMYLASE in the last 168 hours. No results for input(s): AMMONIA in the  last 168 hours. Coagulation Profile: No results for input(s): INR, PROTIME in the last 168 hours. Cardiac Enzymes: No results for input(s): CKTOTAL, CKMB, CKMBINDEX, TROPONINI in the last 168 hours. BNP (last 3 results) No results for input(s): PROBNP in the last 8760 hours. HbA1C: No results for input(s): HGBA1C in the last 72 hours. CBG: Recent Labs  Lab 01/05/19 1638 01/05/19 2111 01/06/19 0755 01/06/19 1118 01/06/19 1657  GLUCAP 302* 248* 309* 222* 158*   Lipid Profile: No results for input(s): CHOL, HDL, LDLCALC, TRIG, CHOLHDL, LDLDIRECT in the last 72 hours. Thyroid Function Tests: No results for input(s): TSH, T4TOTAL, FREET4,  T3FREE, THYROIDAB in the last 72 hours. Anemia Panel: No results for input(s): VITAMINB12, FOLATE, FERRITIN, TIBC, IRON, RETICCTPCT in the last 72 hours. Sepsis Labs: No results for input(s): PROCALCITON, LATICACIDVEN in the last 168 hours.  Recent Results (from the past 240 hour(s))  SARS CORONAVIRUS 2 (TAT 6-24 HRS) Nasopharyngeal Nasopharyngeal Swab     Status: None   Collection Time: 12/31/18 12:36 PM   Specimen: Nasopharyngeal Swab  Result Value Ref Range Status   SARS Coronavirus 2 NEGATIVE NEGATIVE Final    Comment: (NOTE) SARS-CoV-2 target nucleic acids are NOT DETECTED. The SARS-CoV-2 RNA is generally detectable in upper and lower respiratory specimens during the acute phase of infection. Negative results do not preclude SARS-CoV-2 infection, do not rule out co-infections with other pathogens, and should not be used as the sole basis for treatment or other patient management decisions. Negative results must be combined with clinical observations, patient history, and epidemiological information. The expected result is Negative. Fact Sheet for Patients: SugarRoll.be Fact Sheet for Healthcare Providers: https://www.woods-mathews.com/ This test is not yet approved or cleared by the [place] FDA and  has been authorized for detection and/or diagnosis of SARS-CoV-2 by FDA under an Emergency Use Authorization (EUA). This EUA will remain  in effect (meaning this test can be used) for the duration of the COVID-19 declaration under Section 56 4(b)(1) of the Act, 21 U.S.C. section 360bbb-3(b)(1), unless the authorization is terminated or revoked sooner. Performed at [facility], [place] [address] [place], [place] [address]          Radiology Studies: No results found.      Scheduled Meds: . bictegravir-emtricitabine-tenofovir AF  1 tablet Oral Daily  . chlorhexidine  15 mL Mouth/Throat QID  . docusate sodium  100 mg Oral BID  . enoxaparin (LOVENOX) injection  40 mg Subcutaneous Q24H  . feeding supplement (ENSURE ENLIVE)  237 mL Oral BID BM  . fluticasone  1 spray Each Nare Daily  . fluticasone furoate-vilanterol  1 puff Inhalation Daily   And  . umeclidinium bromide  1 puff Inhalation Daily  . insulin aspart  0-15 Units Subcutaneous TID WC  . insulin aspart  0-5 Units Subcutaneous QHS  . insulin aspart  5 Units Subcutaneous TID WC  . insulin glargine  15 Units Subcutaneous QHS  . multivitamin with minerals  1 tablet Oral Daily  . nutrition supplement (JUVEN)  1 packet Oral BID BM  . pantoprazole  40 mg Oral Daily  . pregabalin  225 mg Oral BID  . rosuvastatin  20 mg Oral Daily  . sertraline  150 mg Oral Daily   Continuous Infusions: . methocarbamol (ROBAXIN) IV    . sodium chloride       LOS: 13 days   Time spent 25 mins     [facility]  Pager [phone]  or secure message in [place]

## 2019-01-06 NOTE — Progress Notes (Signed)
Physical Therapy Treatment Patient Details Name: [name] MRN: [id number] DOB: [birth year] Today's Date: 01/06/2019    History of Present Illness [name] is a 54 y.o. female with medical history significant for but not limited to asthma, COPD, stage IV CKD, morbid obesity, depression, anxiety, hypertension, insulin-dependent diabetes mellitus, left-sided BKA, diabetic neuropathy, GERD, HIV, hyperlipidemia who fell from the commode at home.  Found to have Intertrochanteric fracture of the proximal right femur and now s/p IM nail. Post-op was lethargic and placed on Bi-pap and moved to a progressive care unit.     PT Comments    Patient received  In bed, pleasant and willing to participate in therapy, continues to have improved cognition. Rolled with MinA for bedpan placement, then rolled side to side with MinA and use of rails for pericare (totalA) following BM; noted sore on buttocks that was very raw and bleeding following rolling and pericare. She was quite fatigued following rolling multiple times during session, and was positioned to comfort with totalAx2, left in bed with all needs met and bed alarm active this afternoon. Agree that she would not be able to tolerate CIR, continue to recommend SNF.    Follow Up Recommendations  Supervision for mobility/OOB;Supervision/Assistance - 24 hour;SNF     Equipment Recommendations  None recommended by PT(defer to next setting)    Recommendations for Other Services       Precautions / Restrictions Precautions Precautions: Fall Required Braces or Orthoses: Other Brace Other Brace: L LE prosthesis Restrictions Weight Bearing Restrictions: No RLE Weight Bearing: Weight bearing as tolerated    Mobility  Bed Mobility Overal bed mobility: Needs Assistance Bed Mobility: Rolling Rolling: Min assist         General bed mobility comments: MinA- had massive stool blowout and spent available time during this session on  pericare  Transfers                 General transfer comment: deferred  Ambulation/Gait             General Gait Details: deferred   Stairs             Wheelchair Mobility    Modified Rankin (Stroke Patients Only)       Balance                                            Cognition Arousal/Alertness: Awake/alert Behavior During Therapy: Flat affect                       Current Attention Level: Selective Memory: Decreased short-term memory;Decreased recall of precautions Following Commands: Follows one step commands with increased time;Follows one step commands consistently Safety/Judgement: Decreased awareness of safety;Decreased awareness of deficits Awareness: Emergent Problem Solving: Slow processing;Decreased initiation General Comments: cognition continues to improve, able to initiate and participate better this session, much more willing to participate in therapy as a whole and asked appropriate questions about her care      Exercises      General Comments General comments (skin integrity, edema, etc.): bed level session- had massive BM and was very fatigued after rolling for pericare      Pertinent Vitals/Pain Pain Assessment: Faces Faces Pain Scale: Hurts little more Pain Location: R LE with movement, back Pain Descriptors / Indicators: Grimacing;Guarding Pain Intervention(s): Limited activity within patient's tolerance;Monitored  during session    Home Living                      Prior Function            PT Goals (current goals can now be found in the care plan section) Acute Rehab PT Goals Patient Stated Goal: to be able to transfer with prosthesis PT Goal Formulation: Patient unable to participate in goal setting Time For Goal Achievement: 01/20/19 Potential to Achieve Goals: Fair Progress towards PT goals: Progressing toward goals    Frequency    Min 2X/week      PT Plan Current  plan remains appropriate    Co-evaluation              AM-PAC PT "6 Clicks" Mobility   Outcome Measure  Help needed turning from your back to your side while in a flat bed without using bedrails?: A Little Help needed moving from lying on your back to sitting on the side of a flat bed without using bedrails?: A Lot Help needed moving to and from a bed to a chair (including a wheelchair)?: Total Help needed standing up from a chair using your arms (e.g., wheelchair or bedside chair)?: Total Help needed to walk in hospital room?: Total Help needed climbing 3-5 steps with a railing? : Total 6 Click Score: 9    End of Session Equipment Utilized During Treatment: Oxygen Activity Tolerance: Patient tolerated treatment well;Patient limited by fatigue Patient left: in bed;with call bell/phone within reach;with bed alarm set Nurse Communication: Other (comment)(need for purewick) PT Visit Diagnosis: Unsteadiness on feet (R26.81)     Time: 4332-9518 PT Time Calculation (min) (ACUTE ONLY): 31 min  Charges:  $Therapeutic Activity: 23-37 mins                     Windell Norfolk, DPT, PN1   Supplemental Physical Therapist [place]    Pager [phone] Acute Rehab Office [phone]

## 2019-01-06 NOTE — Progress Notes (Signed)
Inpatient Diabetes Program Recommendations  AACE/ADA: New Consensus Statement on Inpatient Glycemic Control   Target Ranges:  Prepandial:   less than 140 mg/dL      Peak postprandial:   less than 180 mg/dL (1-2 hours)      Critically ill patients:  140 - 180 mg/dL   Results for [name], [name] (MRN [id number]) as of 01/06/2019 12:08  Ref. Range 01/05/2019 07:21 01/05/2019 11:41 01/05/2019 16:38 01/05/2019 21:11 01/06/2019 07:55 01/06/2019 11:18  Glucose-Capillary Latest Ref Range: 70 - 99 mg/dL 252 (H) 281 (H) 302 (H) 248 (H) 309 (H) 222 (H)   Review of Glycemic Control  Diabetes history: DM2 Outpatient Diabetes medications: Humulin R U500 40 units BID, Toujeo 50 units BID, Jardiance 10 mg daily Current orders for Inpatient glycemic control: Lantus 10 units QHS, Novolog 0-9 units TID with meals, Novolog 0-5 units QHS  Inpatient Diabetes Program Recommendations:     Insulin-Basal: Please consider increasing Lantus to 15 units QHS.  Insulin-Meal Coverage: Please consider ordering Novolog 5 units TID with meals for meal coverage if patient eats at least 50% of meals.  Thanks, Barnie Alderman, RN, MSN, CDE Diabetes Coordinator Inpatient Diabetes Program [phone] (Team Pager from 8am to 5pm)

## 2019-01-07 DIAGNOSIS — Z89512 Acquired absence of left leg below knee: Secondary | ICD-10-CM

## 2019-01-07 DIAGNOSIS — E119 Type 2 diabetes mellitus without complications: Secondary | ICD-10-CM

## 2019-01-07 DIAGNOSIS — D649 Anemia, unspecified: Secondary | ICD-10-CM

## 2019-01-07 LAB — GLUCOSE, CAPILLARY
Glucose-Capillary: 272 mg/dL — ABNORMAL HIGH (ref 70–99)
Glucose-Capillary: 291 mg/dL — ABNORMAL HIGH (ref 70–99)
Glucose-Capillary: 396 mg/dL — ABNORMAL HIGH (ref 70–99)
Glucose-Capillary: 217 mg/dL — ABNORMAL HIGH (ref 70–99)

## 2019-01-07 MED ORDER — LIP MEDEX EX OINT
TOPICAL_OINTMENT | CUTANEOUS | Status: DC | PRN
  Filled 2019-01-07: qty 7

## 2019-01-07 MED ORDER — GLUCERNA SHAKE PO LIQD: 237.0000 mL | Freq: Three times a day (TID) | ORAL | Status: DC

## 2019-01-07 NOTE — Progress Notes (Signed)
Occupational Therapy Treatment Patient Details Name: [name] MRN: [id number] DOB: 01/18/1965 Today's Date: 01/07/2019    History of present illness [name] is a 54 y.o. female with medical history significant for but not limited to asthma, COPD, stage IV CKD, morbid obesity, depression, anxiety, hypertension, insulin-dependent diabetes mellitus, left-sided BKA, diabetic neuropathy, GERD, HIV, hyperlipidemia who fell from the commode at home.  Found to have Intertrochanteric fracture of the proximal right femur and now s/p IM nail. Post-op was lethargic and placed on Bi-pap and moved to a progressive care unit.    OT comments  Patient semi-supine in bed upon arrival, agreeable to OT. Patient instructed in bed level exercises listed below. Educate patient to perform 2-3x a day as able to maximize strength necessary to participate in self care tasks. Min cues for adequate technique. Patient would benefit from continued acute OT services to maximize patient safety and independence with self care.    Follow Up Recommendations  SNF;Supervision/Assistance - 24 hour    Equipment Recommendations  Other (comment)(defer to next venue)       Precautions / Restrictions Precautions Precautions: Fall Required Braces or Orthoses: Other Brace Other Brace: L LE prosthesis Restrictions Weight Bearing Restrictions: Yes RLE Weight Bearing: Weight bearing as tolerated       Mobility Bed Mobility Overal bed mobility: Needs Assistance             General bed mobility comments: deferred  Transfers Overall transfer level: Needs assistance               General transfer comment: deferred    Balance Overall balance assessment: Needs assistance     Sitting balance - Comments: deferred                                   ADL either performed or assessed with clinical judgement     Cognition Arousal/Alertness: Awake/alert Behavior During Therapy: WFL for  tasks assessed/performed Overall Cognitive Status: Within Functional Limits for tasks assessed                                 General Comments: patient able to follow commands/instructions for bed exercises        Exercises Exercises: General Upper Extremity;General Lower Extremity General Exercises - Upper Extremity Shoulder Flexion: AROM;Strengthening;Both;20 reps;Supine Shoulder Horizontal ABduction: AROM;Strengthening;20 reps;Supine;Both Elbow Flexion: AROM;Both;Strengthening;20 reps;Supine Elbow Extension: AROM;Strengthening;Both;20 reps;Supine Digit Composite Flexion: AROM;Strengthening;20 reps;Supine;Both Composite Extension: AROM;Strengthening;Both;Supine;Other (comment)(20 reps) General Exercises - Lower Extremity Ankle Circles/Pumps: AROM;Strengthening;Right;20 reps;Supine Quad Sets: Both;AROM;Supine;10 reps Heel Slides: Right;Strengthening;Supine;AAROM;10 reps      General Comments 95% on 1L     Pertinent Vitals/ Pain       Pain Assessment: 0-10 Pain Score: 8  Pain Location: R hip Pain Descriptors / Indicators: Aching;Sore Pain Intervention(s): Limited activity within patient's tolerance         Frequency  Min 2X/week        Progress Toward Goals  OT Goals(current goals can now be found in the care plan section)  Progress towards OT goals: Progressing toward goals  Acute Rehab OT Goals Patient Stated Goal: to be able to transfer with prosthesis OT Goal Formulation: With patient Time For Goal Achievement: 01/11/19 Potential to Achieve Goals: Good ADL Goals Pt Will Perform Lower Body Bathing: with min assist;sitting/lateral leans;with adaptive equipment Pt Will Perform Lower  Body Dressing: with min assist;with adaptive equipment;sitting/lateral leans Pt Will Transfer to Toilet: with +2 assist;with mod assist;squat pivot transfer;bedside commode Additional ADL Goal #1: Pt will perform bed mobility with min assist in preparation for  ADL. Additional ADL Goal #2: Pt will tolerate sitting EOB x 10 minutes with supervision in preparation for ADL.  Plan Discharge plan remains appropriate       AM-PAC OT "6 Clicks" Daily Activity     Outcome Measure   Help from another person eating meals?: None Help from another person taking care of personal grooming?: A Little Help from another person toileting, which includes using toliet, bedpan, or urinal?: Total Help from another person bathing (including washing, rinsing, drying)?: A Lot Help from another person to put on and taking off regular upper body clothing?: A Lot Help from another person to put on and taking off regular lower body clothing?: Total 6 Click Score: 13    End of Session Equipment Utilized During Treatment: Oxygen  OT Visit Diagnosis: Pain;History of falling (Z91.81);Muscle weakness (generalized) (M62.81) Pain - Right/Left: Right Pain - part of body: Hip   Activity Tolerance Patient tolerated treatment well   Patient Left in bed;with call bell/phone within reach           Time: [id number] OT Time Calculation (min): 24 min  Charges: OT General Charges $OT Visit: 1 Visit OT Treatments $Therapeutic Exercise: 23-37 mins  [place] OT office: [place] 01/07/2019, 12:46 PM

## 2019-01-07 NOTE — TOC Progression Note (Signed)
Transition of Care [facility]) - Progression Note    Patient Details  Name: [name] MRN: [id number] Date of Birth: [birth year]  Transition of Care [facility] CM/SW [place], [place] Phone Number: 01/07/2019, 10:25 AM  Clinical Narrative:     CSW contacted [facility] to follow up on bed availability- waiting on response.   [place] accepting admissions [facility]- sent another message- waiting on response  Thurmond Butts, MSW, [place] Social Worker   Expected Discharge Plan: Skilled Nursing Facility Barriers to Discharge: (Awaiting bed availability)  Expected Discharge Plan and Services Expected Discharge Plan: [place] In-house Referral: Clinical Social Work   Post Acute Care Choice: [place] Living arrangements for the past 2 months: Single Family Home                                       Social Determinants of Health (SDOH) Interventions    Readmission Risk Interventions Readmission Risk Prevention Plan 06/17/2018  Transportation Screening Complete  Medication Review Press photographer) Complete  PCP or Specialist appointment within 3-5 days of discharge Complete  HRI or Home Care Consult Complete  SW Recovery Care/Counseling Consult Complete  Palliative Care Screening Not Applicable  Skilled Nursing Facility Complete  Some recent data might be hidden

## 2019-01-07 NOTE — Progress Notes (Signed)
PROGRESS NOTE  [name] [id number] DOB: 05/10/1965 DOA: 12/24/2018 PCP: [name], PA-C  Brief Narrative:54 y.o.femalewith medical history significant ofasthma, COPD, stage IV CKD, morbid obesity, depression, anxiety, hypertension, insulin-dependent diabetes mellitus, left-sided BKA, diabetic neuropathy, GERD, HIV, hyperlipidemia was at home today and was trying to get up from the commode reports feeling dizzy and fell on her right side. She reports pain on the right hip. On arrival to ED she underwent x-rays of the right hip and was found to have a Possible left pelvic bone fractures. Probable intertrochanteric fracture of the proximal right femur. As per the patient, she denied hitting her head or syncope. She reports she was trying to get up from the commode after BM, felt dizzy and fell on the right side. She denies any chest pain or sob, fever, chills, nausea, vomiting , abdominal pain, headache, tingling or numbness, diarrhea, dysuria, hematochezia, or hematemesis. She denies any blurry vision at this time.  Orthopedics consulted and she was referred to medical service for admission  Admitted 12/24/18 as above  12/25/18: surgery -Open treatment of intertrochanteric fracture with intramedullary implant 12/26/18: POD1 doing ok from surgical standpoint. More lethargic ON and AM. Moved to progressive and placed on BiPap. Ammonia only mildly elevated. Fevers noted. UA weakly positive and CXR suspicious for early infection. Rocephin, zithro started. Continue for now. Repeat ABG closer to shift change. She is a little more alert now. 12/27/18: was able to move down to 3L. Opens eyes and follows some commands, however, she is still overall lethargic this AM. Will check a CTH. Repeat ammonia. 12/28/18: More alert and interactive this AM. CTH negative. She is doing better. Advance diet. Replete lytes 12/29/18: Alert today. Spoke with son. Difficult situation at home. She is  not mobile and takes pain meds all day. They are unable to take care of her and she seems unwilling to take care of her self. 12/30/18: nothing much new!  12/31/18: PT recommended SNF 01/01/19: Disposition she needs SNF, no bed offers. Her family is not able to take care of her at home. She has not received any bed offers. Covid negative 12/30 01/02/19: pt requesting eval for inpatient rehab. Her goal is to get stronger so she can help care for her grandkids.  01/03/19, we are awaiting reevaluation by PT re: appropriate for CIR  01/04/19: same as yesterday!  01/05/19: also same! CIR has evaluated this patient and recommended SNF 01/06/19:: same as 01/05/19,  off telemetry  1/6: Denies interval changes, awaiting skilled nursing facility placement     HPI/Recap of past 24 hours:  She reports does not use cpap at home, she reports did not feel cpap has helped her, she did not use cpap the last few nights, she is on one liter oxygen supplement, rn is trying to wean her off oxygen  She is waiting for snf bed offer, will need repeat covid test for snf placement  Assessment/Plan: Principal Problem:   Displaced intertrochanteric fracture of right femur, initial encounter for closed fracture ([place]) Active Problems:   Pressure injury of skin Intertrochanteric fracture of the proximal right femur: -She reports she was trying to get up from the commode after BM, felt dizzy and fell on the right side.  OK from surgical standpoint s/pOpen treatment of intertrochanteric fracture with intramedullary implant Pain control; watch narcotics and sedating meds d/t lethergy Evaluating for inpatient rehab, if not will seek placement in SNF  Left rib fracture:  - Pain control and incentive  spirometry.    Normocytic anemia: No clear blood loss, will get fobt hgb fluctuation,  Repeat cbc in am  IDDM2 With hyperglycemia this am Will d/c ensure, change to glucerna Adjust insulin  HIV  -  biktarvy  COPD and Asthma: Stable - flonase, breo ellipta - PRN albuterol, duonebs - CXR with possible early infection -d/c rocephin, zithro; transition to augmentin ( end 12/29) - Bipap prn d/t lethergy, hypoxia  Tobacco abuse:  - Cessation counseling provided.  - on nicotine patch.   History of left BKA with prosthesis  Lethargy-she is awake today.Patient needs CPAP at night. CT head stable atrophy and white matter disease. No acute intracranial abnormality.  Minimize narcotics  Body mass index is 28.22 kg/m.   DVT Prophylaxis:lovenox  Code Status: full  Family Communication: patient   Disposition Plan: snf, needs repeat covid test   Consultants:  ortho  Procedures:  Open treatment of intertrochanteric fracture with intramedullary implant on 12/24 by ortho Dr Erlinda Hong  Antibiotics:  As above   Objective: BP (!) 93/51 (BP Location: Right Arm)   Pulse 82   Temp 99.2 F (37.3 C) (Oral)   Resp 18   Ht 5\' 10"  (1.778 m)   Wt 89.2 kg   LMP 11/16/2010   SpO2 (!) 85%   BMI 28.22 kg/m   Intake/Output Summary (Last 24 hours) at 01/07/2019 1047 Last data filed at 01/07/2019 0541 Gross per 24 hour  Intake 660 ml  Output 2000 ml  Net -1340 ml   Filed Weights   01/05/19 0629 01/06/19 0500 01/07/19 0526  Weight: 91.4 kg 88.2 kg 89.2 kg    Exam: Patient is examined daily including today on 01/07/2019, exams remain the same as of yesterday except that has changed    General:  NAD, aaox3  Cardiovascular: RRR  Respiratory: CTABL  Abdomen: Soft/ND/NT, positive BS  Musculoskeletal: No Edema, /eft bka, right hip postop changes dressing intact  Neuro: alert, oriented   Data Reviewed: Basic Metabolic Panel: Recent Labs  Lab 12/31/18 1441 01/01/19 0510 01/02/19 0853 01/03/19 1400  NA 136 137 138 135  K 6.6* 5.5* 5.4* 4.4  CL 98 98 97* 97*  CO2 33* 31 31 29   GLUCOSE 117* 117* 195* 265*  BUN 11 13 19  26*  CREATININE 0.75  0.75 1.00 1.30*  CALCIUM 8.4* 8.6* 8.8* 8.2*  MG 1.8  --   --   --    Liver Function Tests: No results for input(s): AST, ALT, ALKPHOS, BILITOT, PROT, ALBUMIN in the last 168 hours. No results for input(s): LIPASE, AMYLASE in the last 168 hours. No results for input(s): AMMONIA in the last 168 hours. CBC: No results for input(s): WBC, NEUTROABS, HGB, HCT, MCV, PLT in the last 168 hours. Cardiac Enzymes:   No results for input(s): CKTOTAL, CKMB, CKMBINDEX, TROPONINI in the last 168 hours. BNP (last 3 results) No results for input(s): BNP in the last 8760 hours.  ProBNP (last 3 results) No results for input(s): PROBNP in the last 8760 hours.  CBG: Recent Labs  Lab 01/06/19 0755 01/06/19 1118 01/06/19 1657 01/06/19 2303 01/07/19 0756  GLUCAP 309* 222* 158* 182* 272*    Recent Results (from the past 240 hour(s))  SARS CORONAVIRUS 2 (TAT 6-24 HRS) Nasopharyngeal Nasopharyngeal Swab     Status: None   Collection Time: 12/31/18 12:36 PM   Specimen: Nasopharyngeal Swab  Result Value Ref Range Status   SARS Coronavirus 2 NEGATIVE NEGATIVE Final    Comment: (NOTE) SARS-CoV-2 target  nucleic acids are NOT DETECTED. The SARS-CoV-2 RNA is generally detectable in upper and lower respiratory specimens during the acute phase of infection. Negative results do not preclude SARS-CoV-2 infection, do not rule out co-infections with other pathogens, and should not be used as the sole basis for treatment or other patient management decisions. Negative results must be combined with clinical observations, patient history, and epidemiological information. The expected result is Negative. Fact Sheet for Patients: SugarRoll.be Fact Sheet for Healthcare Providers: https://www.woods-mathews.com/ This test is not yet approved or cleared by the [place] FDA and  has been authorized for detection and/or diagnosis of SARS-CoV-2 by FDA under an Emergency  Use Authorization (EUA). This EUA will remain  in effect (meaning this test can be used) for the duration of the COVID-19 declaration under Section 56 4(b)(1) of the Act, 21 U.S.C. section 360bbb-3(b)(1), unless the authorization is terminated or revoked sooner. Performed at [facility], [place] [address] [place], [place] [address]      Studies: No results found.  Scheduled Meds: . bictegravir-emtricitabine-tenofovir AF  1 tablet Oral Daily  . chlorhexidine  15 mL Mouth/Throat QID  . docusate sodium  100 mg Oral BID  . enoxaparin (LOVENOX) injection  40 mg Subcutaneous Q24H  . feeding supplement (ENSURE ENLIVE)  237 mL Oral BID BM  . fluticasone  1 spray Each Nare Daily  . fluticasone furoate-vilanterol  1 puff Inhalation Daily   And  . umeclidinium bromide  1 puff Inhalation Daily  . insulin aspart  0-15 Units Subcutaneous TID WC  . insulin aspart  0-5 Units Subcutaneous QHS  . insulin aspart  5 Units Subcutaneous TID WC  . insulin glargine  15 Units Subcutaneous QHS  . multivitamin with minerals  1 tablet Oral Daily  . nutrition supplement (JUVEN)  1 packet Oral BID BM  . pantoprazole  40 mg Oral Daily  . pregabalin  225 mg Oral BID  . rosuvastatin  20 mg Oral Daily  . sertraline  150 mg Oral Daily    Continuous Infusions: . methocarbamol (ROBAXIN) IV    . sodium chloride       Time spent: 30mins I have personally reviewed and interpreted on  01/07/2019 daily labs,  imagings as discussed above under date review session and assessment and plans.  I reviewed all nursing notes, pharmacy notes, consultant notes,  vitals, pertinent old records  I have discussed plan of care as described above with RN , patient  on 01/07/2019   Florencia Reasons MD, PhD, FACP  [facility] Pager [phone]. If 7PM-7AM, please contact night-coverage at [email or web address], password [facility] 01/07/2019, 10:47 AM  LOS: 14 days

## 2019-01-08 LAB — GLUCOSE, CAPILLARY
Glucose-Capillary: 325 mg/dL — ABNORMAL HIGH (ref 70–99)
Glucose-Capillary: 164 mg/dL — ABNORMAL HIGH (ref 70–99)
Glucose-Capillary: 237 mg/dL — ABNORMAL HIGH (ref 70–99)
Glucose-Capillary: 151 mg/dL — ABNORMAL HIGH (ref 70–99)

## 2019-01-08 LAB — CBC WITH DIFFERENTIAL/PLATELET
Abs Immature Granulocytes: 0.05 10*3/uL (ref 0.00–0.07)
Basophils Absolute: 0.1 10*3/uL (ref 0.0–0.1)
Basophils Relative: 1 %
Eosinophils Absolute: 0.2 10*3/uL (ref 0.0–0.5)
Eosinophils Relative: 3 %
HCT: 34.2 % — ABNORMAL LOW (ref 36.0–46.0)
Hemoglobin: 10.7 g/dL — ABNORMAL LOW (ref 12.0–15.0)
Immature Granulocytes: 1 %
Lymphocytes Relative: 26 %
Lymphs Abs: 1.8 10*3/uL (ref 0.7–4.0)
MCH: 25.8 pg — ABNORMAL LOW (ref 26.0–34.0)
MCHC: 31.3 g/dL (ref 30.0–36.0)
MCV: 82.4 fL (ref 80.0–100.0)
Monocytes Absolute: 0.7 10*3/uL (ref 0.1–1.0)
Monocytes Relative: 10 %
Neutro Abs: 4.2 10*3/uL (ref 1.7–7.7)
Neutrophils Relative %: 59 %
Platelets: 384 10*3/uL (ref 150–400)
RBC: 4.15 MIL/uL (ref 3.87–5.11)
RDW: 21.2 % — ABNORMAL HIGH (ref 11.5–15.5)
WBC: 7.1 10*3/uL (ref 4.0–10.5)
nRBC: 0 % (ref 0.0–0.2)

## 2019-01-08 LAB — BASIC METABOLIC PANEL
Anion gap: 8 (ref 5–15)
BUN: 24 mg/dL — ABNORMAL HIGH (ref 6–20)
CO2: 31 mmol/L (ref 22–32)
Calcium: 8.8 mg/dL — ABNORMAL LOW (ref 8.9–10.3)
Chloride: 96 mmol/L — ABNORMAL LOW (ref 98–111)
Creatinine, Ser: 1.04 mg/dL — ABNORMAL HIGH (ref 0.44–1.00)
GFR calc Af Amer: >60 mL/min (ref >60)
GFR calc non Af Amer: >60 mL/min (ref >60)
Glucose, Bld: 314 mg/dL — ABNORMAL HIGH (ref 70–99)
Potassium: 4.3 mmol/L (ref 3.5–5.1)
Sodium: 135 mmol/L (ref 135–145)

## 2019-01-08 LAB — MAGNESIUM: Magnesium: 1.7 mg/dL (ref 1.7–2.4)

## 2019-01-08 LAB — SARS CORONAVIRUS 2 (TAT 6-24 HRS): SARS Coronavirus 2: NEGATIVE

## 2019-01-08 MED ORDER — MAGNESIUM SULFATE 2 GM/50ML IV SOLN
2.0000 g | Freq: Once | INTRAVENOUS | Status: AC
  Administered 2019-01-08: 2 g via INTRAVENOUS
  Filled 2019-01-08: qty 50

## 2019-01-08 MED ORDER — INSULIN GLARGINE 100 UNIT/ML ~~LOC~~ SOLN
15.0000 [IU] | Freq: Two times a day (BID) | SUBCUTANEOUS | Status: DC
  Administered 2019-01-08 (×2): 15 [IU] via SUBCUTANEOUS
  Filled 2019-01-08 (×4): qty 0.15

## 2019-01-08 MED ORDER — ENSURE MAX PROTEIN PO LIQD
11.0000 [oz_av] | Freq: Every day | ORAL | Status: DC
  Administered 2019-01-08: 11 [oz_av] via ORAL
  Filled 2019-01-08 (×2): qty 330

## 2019-01-08 NOTE — Progress Notes (Signed)
Nutrition Follow-up  DOCUMENTATION CODES:   Obesity unspecified  INTERVENTION:   -d/c Glucerna -Ensure MAX Protein po daily, each supplement provides 150 kcal and 30 grams of protein -Multivitamin with minerals daily -Juven Fruit Punch BID, each serving provides 95kcal and 2.5g of protein (amino acids glutamine and arginine)  NUTRITION DIAGNOSIS:   Increased nutrient needs related to post-op healing as evidenced by estimated needs.  Ongoing.  GOAL:   Patient will meet greater than or equal to 90% of their needs  Progressing.  MONITOR:   PO intake, Supplement acceptance, Diet advancement, Labs, Weight trends, Skin, I & O's  ASSESSMENT:   [name] is a 54 y.o. female with medical history significant of asthma, COPD, stage IV CKD, morbid obesity, depression, anxiety, hypertension, insulin-dependent diabetes mellitus, left-sided DKA, diabetic neuropathy, GERD, HIV, hyperlipidemia was at home today and was trying to get up from the commode reports feeling dizzy and fell on her right side.  She reports pain on the right hip.  On arrival to ED she underwent x-rays of the right hip and was found to have a Possible left pelvic bone fractures. Probable intertrochanteric fracture of the proximal right femur. As per the patient, she denied hitting her head or syncope. She reports she was trying to get up from the commode after BM, felt dizzy and fell on the right side. She denies any chest pain or sob, fever, chills, nausea, vomiting , abdominal pain, headache, tingling or numbness, diarrhea, dysuria, hematochezia, or hematemesis. She denies any blurry vision at this time. Orthopedics consulted and she was referred to medical service for admission.  Patient is currently consuming 90-100% of meals.  MD d/c Ensure and switched supplement to Glucerna. Pt not drinking Glucerna shakes. Will order Ensure Max daily for additional protein.   Per MD note, pt awaiting SNF  placement.  Admission weight: 235 lbs. Current weight: 196 lbs.   I/Os: -2.7L since 12/24 UOP: 1.5L x 24 hrs  Medications: Multivitamin with minerals daily  Labs reviewed: CBGs: 164-325  Diet Order:   Diet Order            Diet Carb Modified Fluid consistency: Thin; Room service appropriate? Yes  Diet effective now              EDUCATION NEEDS:   No education needs have been identified at this time  Skin:  Skin Assessment: Skin Integrity Issues: Skin Integrity Issues:: Stage II Stage II: rt heel Other: MASD groin  Last BM:  1/6  Height:   Ht Readings from Last 1 Encounters:  12/25/18 5\' 10"  (1.778 m)    Weight:   Wt Readings from Last 1 Encounters:  01/07/19 89.2 kg    Ideal Body Weight:  64 kg(adjusted for left BKA)  BMI:  Body mass index is 28.22 kg/m.  Estimated Nutritional Needs:   Kcal:  [id number]  Protein:  100-115 grams  Fluid:  > 1.8 L  Clayton Bibles, MS, RD, LDN Inpatient Clinical Dietitian Pager: [phone] After Hours Pager: [phone]

## 2019-01-08 NOTE — Progress Notes (Signed)
Physical Therapy Treatment Patient Details Name: [name] MRN: [id number] DOB: 16-Oct-1965 Today's Date: 01/08/2019    History of Present Illness [name] is a 54 y.o. female with medical history significant for but not limited to asthma, COPD, stage IV CKD, morbid obesity, depression, anxiety, hypertension, insulin-dependent diabetes mellitus, left-sided BKA, diabetic neuropathy, GERD, HIV, hyperlipidemia who fell from the commode at home.  Found to have Intertrochanteric fracture of the proximal right femur and now s/p IM nail. Post-op was lethargic and placed on Bi-pap and moved to a progressive care unit.     PT Comments    Patient received in bed, sleeping but easily woken and willing to participate in therapy today. Actually able to perform bed mobility and get to EOB with Min guard-MinAx2 and extended time/increased effort and heavy use of bed raise/with HOB elevation. Tolerated sitting at EOB with min guard x2 for 5 minutes, mildly dizzy but VSS. She was left in bed with all needs met, bed alarm active this morning. Actually is starting to put considerably more effort into PT sessions and beginning to progress well, continue to recommend SNF however.     Follow Up Recommendations  Supervision for mobility/OOB;Supervision/Assistance - 24 hour;SNF     Equipment Recommendations  None recommended by PT(defer)    Recommendations for Other Services       Precautions / Restrictions Precautions Precautions: Fall Required Braces or Orthoses: Other Brace Other Brace: L LE prosthesis Restrictions Weight Bearing Restrictions: Yes RLE Weight Bearing: Weight bearing as tolerated    Mobility  Bed Mobility Overal bed mobility: Needs Assistance       Supine to sit: Min guard;Min assist;+2 for safety/equipment;HOB elevated Sit to supine: Min guard;Min assist;+2 for safety/equipment;HOB elevated   General bed mobility comments: able to perfrom bed mobility with min  guard-MinAx2 for safety, patient performed most of activity and really only needed help with last part of sitting up all the way and getting LEs back to bed  Transfers                 General transfer comment: deferred- fatigue  Ambulation/Gait             General Gait Details: deferred- fatigue   Stairs             Wheelchair Mobility    Modified Rankin (Stroke Patients Only)       Balance Overall balance assessment: Needs assistance   Sitting balance-Leahy Scale: Fair Sitting balance - Comments: min guard for safety statically, likely would have difficulty with dynamic challenge                                    Cognition Arousal/Alertness: Awake/alert Behavior During Therapy: WFL for tasks assessed/performed Overall Cognitive Status: Within Functional Limits for tasks assessed                       Memory: Decreased short-term memory         General Comments: cognition actually much improved- able to follow commands and cues, had appropraite conversation and responses to PT/tech      Exercises      General Comments General comments (skin integrity, edema, etc.): VSS      Pertinent Vitals/Pain Pain Assessment: 0-10 Pain Score: 8  Pain Location: R hip Pain Descriptors / Indicators: Aching;Sore Pain Intervention(s): Limited activity within patient's tolerance;Monitored during  session    Home Living                      Prior Function            PT Goals (current goals can now be found in the care plan section) Acute Rehab PT Goals Patient Stated Goal: to be able to transfer with prosthesis PT Goal Formulation: Patient unable to participate in goal setting Time For Goal Achievement: 01/20/19 Potential to Achieve Goals: Fair Progress towards PT goals: Progressing toward goals    Frequency    Min 2X/week      PT Plan Current plan remains appropriate    Co-evaluation               AM-PAC PT "6 Clicks" Mobility   Outcome Measure  Help needed turning from your back to your side while in a flat bed without using bedrails?: A Little Help needed moving from lying on your back to sitting on the side of a flat bed without using bedrails?: A Lot Help needed moving to and from a bed to a chair (including a wheelchair)?: Total Help needed standing up from a chair using your arms (e.g., wheelchair or bedside chair)?: Total Help needed to walk in hospital room?: Total Help needed climbing 3-5 steps with a railing? : Total 6 Click Score: 9    End of Session Equipment Utilized During Treatment: Oxygen Activity Tolerance: Patient tolerated treatment well;Patient limited by fatigue Patient left: in bed;with call bell/phone within reach;with bed alarm set   PT Visit Diagnosis: Unsteadiness on feet (R26.81)     Time: 2947-6546 PT Time Calculation (min) (ACUTE ONLY): 18 min  Charges:  $Therapeutic Activity: 8-22 mins                     Windell Norfolk, DPT, PN1   Supplemental Physical Therapist [place]    Pager [phone] Acute Rehab Office [phone]

## 2019-01-08 NOTE — TOC Progression Note (Addendum)
Transition of Care [facility]) - Progression Note    Patient Details  Name: [name] MRN: [id number] Date of Birth: 1965-01-06  Transition of Care [facility]) CM/SW [place], [place] Phone Number: 01/08/2019, 2:56 PM  Clinical Narrative:   3:39pm- CSW received approval for pt family member to come tomorrow. Have requested a time from daughter in law to arrange bedside meeting. Per pt daughter in law [name] pt is now willing to accept offer. CSW and TOC team to follow, waiver in effect for [facility]. Admissions director Claiborne Billings aware and available.    2:56pm- CSW spoke with pt via telephone in room. We discussed that pt has offer from [facility] and that we cannot wait for a bed to open up at [facility] if bed available elsewhere. CSW received permission to speak with pt son and daughter in law. Pt expressed displeasure at distance and wants to go home.   CSW spoke with pt daughter in law [name] who had also reached out to the nurse. We discussed bed availability and that pt is stable for discharge therefore we cannot delay discharge further. [name] shared that they do not have enough assistance to bring pt home at this time. Requested that [name] or pt son come to the hospital to discuss this with pt. [name] states that there is another individual who brought pt clothes and is contact. CSW to message RN staff to see if pt daughter in law can be approved to come visit pt at bedside and discuss disposition.   Expected Discharge Plan: [place] Barriers to Discharge: (Awaiting bed availability)  Expected Discharge Plan and Services Expected Discharge Plan: [place] In-house Referral: Clinical Social Work   Post Acute Care Choice: [place] arrangements for the past 2 months: Single Family Home  Readmission Risk Interventions Readmission Risk Prevention Plan 06/17/2018  Transportation Screening Complete  Medication  Review Press photographer) Complete  PCP or Specialist appointment within 3-5 days of discharge Complete  HRI or Home Care Consult Complete  SW Recovery Care/Counseling Consult Complete  Palliative Care Screening Not [place] Complete  Some recent data might be hidden

## 2019-01-08 NOTE — Progress Notes (Signed)
PROGRESS NOTE  [name] [id number] DOB: 1965-11-05 DOA: 12/24/2018 PCP: [name], PA-C  Brief Narrative:54 y.o.femalewith medical history significant ofasthma, COPD, stage IV CKD, morbid obesity, depression, anxiety, hypertension, insulin-dependent diabetes mellitus, left-sided BKA, diabetic neuropathy, GERD, HIV, hyperlipidemia was at home today and was trying to get up from the commode reports feeling dizzy and fell on her right side. She reports pain on the right hip. On arrival to ED she underwent x-rays of the right hip and was found to have a Possible left pelvic bone fractures. Probable intertrochanteric fracture of the proximal right femur. As per the patient, she denied hitting her head or syncope. She reports she was trying to get up from the commode after BM, felt dizzy and fell on the right side. She denies any chest pain or sob, fever, chills, nausea, vomiting , abdominal pain, headache, tingling or numbness, diarrhea, dysuria, hematochezia, or hematemesis. She denies any blurry vision at this time.  Orthopedics consulted and she was referred to medical service for admission  Admitted 12/24/18 as above  12/25/18: surgery -Open treatment of intertrochanteric fracture with intramedullary implant 12/26/18: POD1 doing ok from surgical standpoint. More lethargic ON and AM. Moved to progressive and placed on BiPap. Ammonia only mildly elevated. Fevers noted. UA weakly positive and CXR suspicious for early infection. Rocephin, zithro started. Continue for now. Repeat ABG closer to shift change. She is a little more alert now. 12/27/18: was able to move down to 3L. Opens eyes and follows some commands, however, she is still overall lethargic this AM. Will check a CTH. Repeat ammonia. 12/28/18: More alert and interactive this AM. CTH negative. She is doing better. Advance diet. Replete lytes 12/29/18: Alert today. Spoke with son. Difficult situation at home. She is  not mobile and takes pain meds all day. They are unable to take care of her and she seems unwilling to take care of her self. 12/30/18: nothing much new!  12/31/18: PT recommended SNF 01/01/19: Disposition she needs SNF, no bed offers. Her family is not able to take care of her at home. She has not received any bed offers. Covid negative 12/30 01/02/19: pt requesting eval for inpatient rehab. Her goal is to get stronger so she can help care for her grandkids.  01/03/19, we are awaiting reevaluation by PT re: appropriate for CIR  01/04/19: same as yesterday!  01/05/19: also same! CIR has evaluated this patient and recommended SNF 01/06/19:: same as 01/05/19,  off telemetry  1/6: Denies interval changes, awaiting skilled nursing facility placement     HPI/Recap of past 24 hours:  She reports does not use cpap at home, she declined cpap last night , she is on room air this am, no hypoxia  She is waiting for snf bed offer, will need repeat covid test for snf placement  Assessment/Plan: Principal Problem:   Displaced intertrochanteric fracture of right femur, initial encounter for closed fracture [facility]) Active Problems:   Pressure injury of skin Intertrochanteric fracture of the proximal right femur: -She reports she was trying to get up from the commode after BM, felt dizzy and fell on the right side.  OK from surgical standpoint s/pOpen treatment of intertrochanteric fracture with intramedullary implant Pain control; watch narcotics and sedating meds d/t lethergy inpatient rehab declined admission, awaiting for SNF placement  Left rib fracture:  - Pain control and incentive spirometry.    Normocytic anemia: No clear blood loss, fobt pending collection hgb fluctuation,  Repeat cbc this am is  10.7  IDDM2 With hyperglycemia   ensure discontinued, now on glucerna Increase lantus from daily to bid, ( was on 40-50 units bid at home), continue meal  coverage insulin and ssi   HIV  - biktarvy  COPD and Asthma: Stable - flonase, breo ellipta - PRN albuterol, duonebs - CXR with possible early infection -d/c rocephin, zithro; transition to augmentin ( end 12/29) - Bipap prn d/t lethergy, hypoxia  Tobacco abuse:  - Cessation counseling provided.  - on nicotine patch.   History of left BKA with prosthesis  Lethargy-she is awake today.Patient needs CPAP at night but she declined. CT head stable atrophy and white matter disease. No acute intracranial abnormality.  Minimize narcotics  Body mass index is 28.22 kg/m.   DVT Prophylaxis:lovenox  Code Status: full  Family Communication: patient   Disposition Plan: snf, needs repeat covid test   Consultants:  ortho  Procedures:  Open treatment of intertrochanteric fracture with intramedullary implant on 12/24 by ortho Dr Erlinda Hong  Antibiotics:  As above   Objective: BP 115/63 (BP Location: Right Arm)   Pulse 75   Temp 97.7 F (36.5 C) (Oral)   Resp 18   Ht 5\' 10"  (1.778 m)   Wt 89.2 kg   LMP 11/16/2010   SpO2 93%   BMI 28.22 kg/m   Intake/Output Summary (Last 24 hours) at 01/08/2019 0855 Last data filed at 01/08/2019 [id number] Gross per 24 hour  Intake 780 ml  Output 1500 ml  Net -720 ml   Filed Weights   01/05/19 0629 01/06/19 0500 01/07/19 0526  Weight: 91.4 kg 88.2 kg 89.2 kg    Exam: Patient is examined daily including today on 01/08/2019, exams remain the same as of yesterday except that has changed    General:  NAD, aaox3  Cardiovascular: RRR  Respiratory: CTABL  Abdomen: Soft/ND/NT, positive BS  Musculoskeletal: No Edema, /eft bka, right hip postop changes dressing intact, she is not able to lift her right leg against gravity due to pain  Neuro: alert, oriented   Data Reviewed: Basic Metabolic Panel: Recent Labs  Lab 01/02/19 0853 01/03/19 1400 01/08/19 0636  NA 138 135 135  K 5.4* 4.4 4.3  CL 97* 97* 96*  CO2 31 29 31   GLUCOSE  195* 265* 314*  BUN 19 26* 24*  CREATININE 1.00 1.30* 1.04*  CALCIUM 8.8* 8.2* 8.8*  MG  --   --  1.7   Liver Function Tests: No results for input(s): AST, ALT, ALKPHOS, BILITOT, PROT, ALBUMIN in the last 168 hours. No results for input(s): LIPASE, AMYLASE in the last 168 hours. No results for input(s): AMMONIA in the last 168 hours. CBC: Recent Labs  Lab 01/08/19 0636  WBC 7.1  NEUTROABS 4.2  HGB 10.7*  HCT 34.2*  MCV 82.4  PLT 384   Cardiac Enzymes:   No results for input(s): CKTOTAL, CKMB, CKMBINDEX, TROPONINI in the last 168 hours. BNP (last 3 results) No results for input(s): BNP in the last 8760 hours.  ProBNP (last 3 results) No results for input(s): PROBNP in the last 8760 hours.  CBG: Recent Labs  Lab 01/07/19 0756 01/07/19 1134 01/07/19 1646 01/07/19 2102 01/08/19 0743  GLUCAP 272* 291* 396* 217* 325*    Recent Results (from the past 240 hour(s))  SARS CORONAVIRUS 2 (TAT 6-24 HRS) Nasopharyngeal Nasopharyngeal Swab     Status: None   Collection Time: 12/31/18 12:36 PM   Specimen: Nasopharyngeal Swab  Result Value Ref Range Status   SARS  Coronavirus 2 NEGATIVE NEGATIVE Final    Comment: (NOTE) SARS-CoV-2 target nucleic acids are NOT DETECTED. The SARS-CoV-2 RNA is generally detectable in upper and lower respiratory specimens during the acute phase of infection. Negative results do not preclude SARS-CoV-2 infection, do not rule out co-infections with other pathogens, and should not be used as the sole basis for treatment or other patient management decisions. Negative results must be combined with clinical observations, patient history, and epidemiological information. The expected result is Negative. Fact Sheet for Patients: SugarRoll.be Fact Sheet for Healthcare Providers: https://www.woods-mathews.com/ This test is not yet approved or cleared by the [place] FDA and  has been authorized for detection  and/or diagnosis of SARS-CoV-2 by FDA under an Emergency Use Authorization (EUA). This EUA will remain  in effect (meaning this test can be used) for the duration of the COVID-19 declaration under Section 56 4(b)(1) of the Act, 21 U.S.C. section 360bbb-3(b)(1), unless the authorization is terminated or revoked sooner. Performed at [facility], [place] [address] [place], [place] [address]      Studies: No results found.  Scheduled Meds: . bictegravir-emtricitabine-tenofovir AF  1 tablet Oral Daily  . chlorhexidine  15 mL Mouth/Throat QID  . docusate sodium  100 mg Oral BID  . enoxaparin (LOVENOX) injection  40 mg Subcutaneous Q24H  . feeding supplement (GLUCERNA SHAKE)  237 mL Oral TID BM  . fluticasone  1 spray Each Nare Daily  . fluticasone furoate-vilanterol  1 puff Inhalation Daily   And  . umeclidinium bromide  1 puff Inhalation Daily  . insulin aspart  0-15 Units Subcutaneous TID WC  . insulin aspart  0-5 Units Subcutaneous QHS  . insulin aspart  5 Units Subcutaneous TID WC  . insulin glargine  15 Units Subcutaneous BID  . multivitamin with minerals  1 tablet Oral Daily  . nutrition supplement (JUVEN)  1 packet Oral BID BM  . pantoprazole  40 mg Oral Daily  . pregabalin  225 mg Oral BID  . rosuvastatin  20 mg Oral Daily  . sertraline  150 mg Oral Daily    Continuous Infusions: . methocarbamol (ROBAXIN) IV    . sodium chloride       Time spent: 57mins I have personally reviewed and interpreted on  01/08/2019 daily labs,  imagings as discussed above under date review session and assessment and plans.  I reviewed all nursing notes, pharmacy notes, consultant notes,  vitals, pertinent old records  I have discussed plan of care as described above with RN , patient  on 01/08/2019   Florencia Reasons MD, PhD, FACP  [facility] Pager [phone]. If 7PM-7AM, please contact night-coverage at [email or web address], password [facility] 01/08/2019, 8:55 AM  LOS: 15 days

## 2019-01-09 DIAGNOSIS — J438 Other emphysema: Secondary | ICD-10-CM

## 2019-01-09 DIAGNOSIS — N183 Chronic kidney disease, stage 3 unspecified: Secondary | ICD-10-CM

## 2019-01-09 DIAGNOSIS — R627 Adult failure to thrive: Secondary | ICD-10-CM

## 2019-01-09 DIAGNOSIS — I5032 Chronic diastolic (congestive) heart failure: Secondary | ICD-10-CM

## 2019-01-09 DIAGNOSIS — Z9981 Dependence on supplemental oxygen: Secondary | ICD-10-CM

## 2019-01-09 LAB — GLUCOSE, CAPILLARY
Glucose-Capillary: 276 mg/dL — ABNORMAL HIGH (ref 70–99)
Glucose-Capillary: 180 mg/dL — ABNORMAL HIGH (ref 70–99)

## 2019-01-09 LAB — BASIC METABOLIC PANEL
Anion gap: 13 (ref 5–15)
BUN: 33 mg/dL — ABNORMAL HIGH (ref 6–20)
CO2: 23 mmol/L (ref 22–32)
Calcium: 8.5 mg/dL — ABNORMAL LOW (ref 8.9–10.3)
Chloride: 97 mmol/L — ABNORMAL LOW (ref 98–111)
Creatinine, Ser: 1.15 mg/dL — ABNORMAL HIGH (ref 0.44–1.00)
GFR calc Af Amer: >60 mL/min (ref >60)
GFR calc non Af Amer: 54 mL/min — ABNORMAL LOW (ref >60)
Glucose, Bld: 259 mg/dL — ABNORMAL HIGH (ref 70–99)
Potassium: 5.2 mmol/L — ABNORMAL HIGH (ref 3.5–5.1)
Sodium: 133 mmol/L — ABNORMAL LOW (ref 135–145)

## 2019-01-09 MED ORDER — INSULIN ASPART 100 UNIT/ML ~~LOC~~ SOLN: 0.0000 [IU] | Freq: Three times a day (TID) | SUBCUTANEOUS | 0 refills | Status: DC

## 2019-01-09 MED ORDER — INSULIN GLARGINE 100 UNIT/ML ~~LOC~~ SOLN
20.0000 [IU] | Freq: Two times a day (BID) | SUBCUTANEOUS | Status: DC
  Administered 2019-01-09: 20 [IU] via SUBCUTANEOUS
  Filled 2019-01-09 (×2): qty 0.2

## 2019-01-09 MED ORDER — INSULIN ASPART 100 UNIT/ML ~~LOC~~ SOLN: 5.0000 [IU] | Freq: Three times a day (TID) | SUBCUTANEOUS | 11 refills | Status: DC

## 2019-01-09 MED ORDER — OXYCODONE-ACETAMINOPHEN 5-325 MG PO TABS: 1.0000 | ORAL_TABLET | Freq: Three times a day (TID) | ORAL | 0 refills | Status: DC | PRN

## 2019-01-09 MED ORDER — SENNOSIDES-DOCUSATE SODIUM 8.6-50 MG PO TABS: 1.0000 | ORAL_TABLET | Freq: Every day | ORAL | Status: DC

## 2019-01-09 MED ORDER — INSULIN GLARGINE 100 UNIT/ML ~~LOC~~ SOLN: 25.0000 [IU] | Freq: Two times a day (BID) | SUBCUTANEOUS | 11 refills | Status: DC

## 2019-01-09 NOTE — Social Work (Signed)
Clinical Social Worker facilitated patient discharge including contacting patient family and facility to confirm patient discharge plans.  Clinical information faxed to facility and family agreeable with plan.  CSW arranged ambulance transport via [facility] to [facility]. RN to call [phone]  with report prior to discharge.  Clinical Social Worker will sign off for now as social work intervention is no longer needed. Please consult us again if new need arises.  Massa Pe, MSW, LCSWA Clinical Social Worker   

## 2019-01-09 NOTE — Discharge Summary (Signed)
Discharge Summary  [name] QGB:[id number] DOB: 1965-12-30  PCP: [name], PA-C  Admit date: 12/24/2018 Discharge date: 01/09/2019  Time spent: 70mns, more than 50% time spent on coordination of care.  Recommendations for Outpatient Follow-up:  1. F/u with SNF MD week  for hospital discharge follow up, repeat cbc/bmp at follow up 2. F/u with ortho Dr XErlinda Hongin one week for suture removal and wound check 3. F/u with infectious disease Dr HJohnnye Simafor HIV management  Discharge Diagnoses:  Active Hospital Problems   Diagnosis Date Noted   Displaced intertrochanteric fracture of right femur, initial encounter for closed fracture ([place] 12/24/2018   Pressure injury of skin 12/26/2018    Resolved Hospital Problems  No resolved problems to display.    Discharge Condition: stable  Diet recommendation: heart healthy/carb modified  Filed Weights   01/06/19 0500 01/07/19 0526 01/09/19 0500  Weight: 88.2 kg 89.2 kg 87.3 kg    History of present illness: (Per admitting MD Dr. AKarleen Hampshire PCP: WMarda Stalker PA-C   Patient coming from: Home   Chief Complaint: Presyncope and fall on the left side  HPI: Erin SANTIESTEBANis a 54y.o. female with medical history significant of asthma, COPD, stage IV CKD, morbid obesity, depression, anxiety, hypertension, insulin-dependent diabetes mellitus, left-sided DKA, diabetic neuropathy, GERD, HIV, hyperlipidemia was at home today and was trying to get up from the commode reports feeling dizzy and fell on her right side.  She reports pain on the right hip.  On arrival to ED she underwent x-rays of the right hip and was found to have a Possible left pelvic bone fractures. Probable intertrochanteric fracture of the proximal right femur. As per the patient, she denied hitting her head or syncope. She reports she was trying to get up from the commode after BM, felt dizzy and fell on the right side. She denies any chest pain or sob, fever,  chills, nausea, vomiting , abdominal pain, headache, tingling or numbness, diarrhea, dysuria, hematochezia, or hematemesis. She denies any blurry vision at this time.  Orthopedics consulted and she was referred to medical service for admission.    ED Course:on arrival to ED she was found tobe afebrile, hypotensive with bp 70/50'S, . LABS revealed sodium of 140, creatinine of 1.3, alk phos of 187, wbc of 11.6, normal lactate.  covid 19 negative.  CXR does not show active disease. But displaced left fracture of the posterior LEFT fifth rib, most likely acute. Blood cultures ordered and pending.   X rays of the hip show Possible left pelvic bone fractures.  Probable intertrochanteric fracture of the proximal right femur   Admitted 12/23/20as above  12/25/18: surgery-Open treatment of intertrochanteric fracture with intramedullary implant 12/26/18: POD1 doing ok from surgical standpoint. More lethargic ON and AM. Moved to progressive and placed on BiPap. Ammonia only mildly elevated. Fevers noted. UA weakly positive and CXR suspicious for early infection. Rocephin, zithro started. Continue for now. Repeat ABG closer to shift change. She is a little more alert now. 12/27/18: was able to move down to 3L. Opens eyes and follows some commands, however, she is still overall lethargic this AM. Will check a CTH. Repeat ammonia. 12/28/18: More alert and interactive this AM. CTH negative. She is doing better. Advance diet. Replete lytes 12/29/18: Alert today. Spoke with son. Difficult situation at home. She is not mobile and takes pain meds all day. They are unable to take care of her and she seems unwilling to take care of her  self. 12/30/18: nothing much new!  12/31/18: PT recommended SNF  Hospital Course:  Principal Problem:   Displaced intertrochanteric fracture of right femur, initial encounter for closed fracture [facility]) Active Problems:   Pressure injury of skin   Intertrochanteric  fracture of the proximal right femur:  -History of left BKA with prosthesis -She reports she was trying to get up from the commode after BM, felt dizzy and fell on the right side.  -s/pOpen treatment of intertrochanteric fracture with intramedullary implant  (right )on 12/24 by ortho Dr Erlinda Hong -Pain control; cautious with narcotics and sedating meds d/t lethergy, Ortho has prescribed Lovenox for postop DVT prophylaxis -inpatient rehab declined admission, she is discharged to SNF placement -Follow-up with Orthopedics  Left rib fracture:  -She denies of her rib pain , encourage incentive spirometry.    Dizziness and fall, Debility - Possibly secondary to hypotension.  She does has longstanding diabetes, peripheral neuropathy and autonomic dysfunction could also contribute. - echo: Left Ventricle: Left ventricular ejection fraction, by visual estimation, is 60 to 65%. The left ventricle has normal function. The left ventricle has no regional wall motion abnormalities. The left ventricular internal cavity size was the left  ventricle is normal in size. There is mildly increased left ventricular hypertrophy. Left ventricular diastolic parameters were normal.Right Ventricle: The right ventricular size is mildly enlarged. No increase in right ventricular wall thickness. Global RV systolic function is has normal systolic function. - negative trp  -CT head no acute findings -She denies dizziness in the hospital  Normocytic anemia: No clear blood loss hgb fluctuation,  hgb  10.7 at discharge monitor  IDDM2, With hyperglycemia   ensure discontinued, now on glucerna Increase lantus from daily to bid, ( was on 40-50 units bid at home), continue meal  coverage insulin and ssi  CKD IIIa - Baseline creatinine ranging between 1 to 1.4  Pyuria, she denies urinary symptom -UCxwith yeast -stop rocephin -Urine culture with candiduria. Not treated.  HIV  -  biktarvy  COPD and Asthma, chronic hypoxic respiratory failure:  per prior discharge summary she supposed to be on home O2 3 L at baseline - flonase, breo ellipta - PRN albuterol, duonebs - CXR with possible early infection -d/c rocephin, zithro; transition to augmentin ( end 12/29) - Bipap prn d/t lethergy, hypoxia  History of chronic diastolic CHF, per chart review -She is euvolemic, she does not use Lasix at home -She is followed with cardiology Dr. Harrington Challenger 1 time in 2018  Tobacco abuse:  - Cessation counseling provided.  - on nicotine patch.    Lethargy-she is awake and oriented for the last few days.Patient needs CPAP at night but she declined. CT head stable atrophy and white matter disease. No acute intracranial abnormality. Minimize narcotics  Body mass index is 28.22 kg/m.   DVT Prophylaxis:lovenox  Code Status: full  Family Communication: patient   Disposition Plan: snf, needs repeat covid test   Consultants:  ortho  Procedures:  Open treatment of intertrochanteric fracture with intramedullary implant on 12/24 by ortho Dr Erlinda Hong  Antibiotics:  As above   Discharge Exam: BP (!) 92/58 (BP Location: Left Arm)    Pulse 72    Temp 97.7 F (36.5 C) (Oral)    Resp 18    Ht 5' 10"  (1.778 m)    Wt 87.3 kg    LMP 11/16/2010    SpO2 95%    BMI 27.62 kg/m   General: NAD, AAOx3 Cardiovascular: RRR Respiratory: CTA BL Musculoskeletal: No  Edema, /eft bka, right hip postop changes dressing intact, she is not able to lift her right leg against gravity due to pain with movement  Discharge Instructions You were cared for by a hospitalist during your hospital stay. If you have any questions about your discharge medications or the care you received while you were in the hospital after you are discharged, you can call the unit and asked to speak with the hospitalist on call if the hospitalist that took care of you is not available.  Once you are discharged, your primary care physician will handle any further medical issues. Please note that NO REFILLS for any discharge medications will be authorized once you are discharged, as it is imperative that you return to your primary care physician (or establish a relationship with a primary care physician if you do not have one) for your aftercare needs so that they can reassess your need for medications and monitor your lab values.  Discharge Instructions    Diet - low sodium heart healthy   Complete by: As directed    Carb modified   Increase activity slowly   Complete by: As directed    Weight bearing as tolerated   Complete by: As directed      Allergies as of 01/09/2019      Reactions   Cortizone-10 [hydrocortisone] Rash   Per patient, given local injection at knee and developed rash at local site.       Medication List    STOP taking these medications   ALPRAZolam 0.5 MG tablet Commonly known as: XANAX   HumuLIN R U-500 KwikPen 500 UNIT/ML kwikpen Generic drug: insulin regular human CONCENTRATED   Toujeo SoloStar 300 UNIT/ML Sopn Generic drug: Insulin Glargine (1 Unit Dial) Replaced by: insulin glargine 100 UNIT/ML injection     TAKE these medications   Accu-Chek Aviva device Use to test blood sugar daily   Accu-Chek Aviva test strip Generic drug: glucose blood Use as instructed to check blood sugar 2 times daily.   acetaminophen 325 MG tablet Commonly known as: TYLENOL Take 1-2 tablets (325-650 mg total) by mouth every 6 (six) hours as needed for mild pain (pain score 1-3 or temp > 100.5).   albuterol 108 (90 Base) MCG/ACT inhaler Commonly known as: VENTOLIN HFA INHALE 2 PUFFS INTO THE LUNGS EVERY 6 HOURS AS NEEDED FOR WHEEZING OR SHORTNESS OF BREATH What changed:   how much to take  how to take this  when to take this  reasons to take this  additional instructions   bictegravir-emtricitabine-tenofovir AF 50-200-25 MG Tabs tablet Commonly  known as: BIKTARVY Take 1 tablet by mouth daily.   empagliflozin 10 MG Tabs tablet Commonly known as: JARDIANCE Take 10 mg by mouth daily.   enoxaparin 40 MG/0.4ML injection Commonly known as: LOVENOX Inject 0.4 mLs (40 mg total) into the skin daily.   esomeprazole 20 MG packet Commonly known as: NexIUM Take 20 mg by mouth daily before breakfast.   fluticasone 50 MCG/ACT nasal spray Commonly known as: FLONASE Place 1 spray into both nostrils daily.   insulin aspart 100 UNIT/ML injection Commonly known as: novoLOG Inject 0-15 Units into the skin 3 (three) times daily with meals. Insulin sliding scale: Blood sugar  120-150   3units                       151-200   4units  201-250   7units                       251- 300  11units                       301-350   15uints                       351-400   20units                       >400         call MD immediately   insulin aspart 100 UNIT/ML injection Commonly known as: novoLOG Inject 5 Units into the skin 3 (three) times daily with meals.   insulin glargine 100 UNIT/ML injection Commonly known as: LANTUS Inject 0.25 mLs (25 Units total) into the skin 2 (two) times daily. Replaces: Toujeo SoloStar 300 UNIT/ML Sopn   ipratropium-albuterol 0.5-2.5 (3) MG/3ML Soln Commonly known as: DUONEB Take 3 mLs by nebulization every 6 (six) hours. What changed:   when to take this  reasons to take this   oxyCODONE-acetaminophen 5-325 MG tablet Commonly known as: Percocet Take 1-2 tablets by mouth every 8 (eight) hours as needed for severe pain.   pregabalin 225 MG capsule Commonly known as: LYRICA Take 225 mg by mouth 2 (two) times daily.   rosuvastatin 20 MG tablet Commonly known as: CRESTOR Take 1 tablet (20 mg total) by mouth daily. Reported on 05/02/2015   senna-docusate 8.6-50 MG tablet Commonly known as: Senokot-S Take 1 tablet by mouth at bedtime.   sertraline 50 MG tablet Commonly known as:  ZOLOFT Take 50 mg by mouth daily. (Give with 179m tablet for a total of 1515m What changed: Another medication with the same name was removed. Continue taking this medication, and follow the directions you see here.   Trelegy Ellipta 100-62.5-25 MCG/INH Aepb Generic drug: Fluticasone-Umeclidin-Vilant Inhale 1 puff into the lungs daily.            Discharge Care Instructions  (From admission, onward)         Start     Ordered   12/25/18 0000  Weight bearing as tolerated     12/25/18 1431         Allergies  Allergen Reactions   Cortizone-10 [Hydrocortisone] Rash    Per patient, given local injection at knee and developed rash at local site.     Contact information for follow-up providers    XuLeandrew KoyanagiMD In 2 weeks.   Specialty: Orthopedic Surgery Why: For suture removal, For wound re-check Contact information: [address] [phone]        WhMarda StalkerPA-C Follow up.   Specialty: Family Medicine Contact information: [phone]      HaCampbell RichesMD .   Specialty: Infectious Diseases Contact information: [address] [place] [phone]        RoFay RecordsMD .   Specialty: Cardiology Contact information: [phone]          Contact information for after-discharge care    [facility] .   Service: Skilled NuChiropodistnformation: [address] [place] [phone]  The results of significant diagnostics from this hospitalization (including imaging, microbiology, ancillary and laboratory) are listed below for reference.    Significant Diagnostic Studies: CT HEAD WO CONTRAST  Result Date: 12/27/2018 CLINICAL DATA:  Altered mental status. Recent fall. EXAM:  CT HEAD WITHOUT CONTRAST TECHNIQUE: Contiguous axial images were obtained from the base of the skull through the vertex without intravenous contrast. COMPARISON:  CT head without contrast 06/02/2018 FINDINGS: Brain: Atrophy and white matter changes are stable. No acute infarct, hemorrhage, or mass lesion is present. The ventricles are proportionate to the degree of atrophy. No significant extraaxial fluid collection is present. Vascular: Atherosclerotic changes are present within the cavernous internal carotid arteries bilaterally. There is no hyperdense vessel. Skull: Calvarium is intact. No focal lytic or blastic lesions are present. Left supraorbital soft tissue swelling is present without underlying fracture. No radiopaque foreign body is present. Sinuses/Orbits: The paranasal sinuses and mastoid air cells are clear. The globes and orbits are within normal limits. IMPRESSION: 1. Stable atrophy and white matter disease. 2. No acute intracranial abnormality or significant interval change. 3. Left supraorbital soft tissue swelling without underlying fracture. Electronically Signed   By: San Morelle M.D.   On: 12/27/2018 18:08   CT PELVIS WO CONTRAST  Result Date: 12/25/2018 CLINICAL DATA:  Pelvic fracture EXAM: CT PELVIS WITHOUT CONTRAST TECHNIQUE: Multidetector CT imaging of the pelvis was performed following the standard protocol without intravenous contrast. COMPARISON:  None. FINDINGS: Urinary Tract: The visualized distal ureters and bladder appear unremarkable. Bowel: No bowel wall thickening, distention or surrounding inflammation identified within the pelvis. Scattered colonic diverticula are noted. Vascular/Lymphatic: No enlarged pelvic lymph nodes identified. Scattered atherosclerosis is noted. Reproductive: The uterus and adnexa are unremarkable. Other: There is diffuse dependent subcutaneous edema. Musculoskeletal: There is a comminuted displaced right intratrochanteric and proximal femoral  shaft. There is approximately a a 2 cm of posterior displacement of the femoral shaft. The femoral head appears to be anteriorly rotated, however still articulates with the acetabulum. There is diffuse osteopenia. The patient has had prior cerclage fixation and healed fracture deformity of the right proximal femur. There is fragmentation with sclerosis of the superior left femoral head, likely chronic avascular necrosis. No femoral head collapse however is noted. The sacroiliac joints appear to be intact. IMPRESSION: 1. Comminuted displaced right intratrochanteric fracture involving the proximal femoral shaft. Approximately 2 cm posterior displacement of the femoral shaft. 2. Findings suggestive of chronic avascular necrosis of the left superior femoral head. 3. Diffuse osteopenia. Electronically Signed   By: Prudencio Pair M.D.   On: 12/25/2018 00:28   US RENAL  Result Date: 12/26/2018 CLINICAL DATA:  54 year old female with urinary retention. History of hypertension and diabetes. EXAM: RENAL / URINARY TRACT ULTRASOUND COMPLETE COMPARISON:  Renal ultrasound dated 08/20/2016. FINDINGS: Right Kidney: Renal measurements: 11.0 x 4.5 x 5.0 cm = volume: 130 mL. Mild parenchyma atrophy. Normal echogenicity. No hydronephrosis or shadowing stone. Left Kidney: Renal measurements: 9.8 x 4.9 x 4.9 cm = volume: 120 mL. Mild parenchyma atrophy. Normal echogenicity. No hydronephrosis or shadowing stone. Bladder: Appears normal for degree of bladder distention. Other: None. IMPRESSION: No hydronephrosis or shadowing stone. Electronically Signed   By: Anner Crete M.D.   On: 12/26/2018 21:05   DG Pelvis Portable  Result Date: 12/25/2018 CLINICAL DATA:  Post RIGHT femoral nail, RIGHT femur internally rotated and resistant to movement EXAM: PORTABLE PELVIS 1-2 VIEWS COMPARISON:  Portable exam 1515 hours compared to 12/24/2018 FINDINGS: RIGHT femoral nail has been  placed with 2 screws across the intertrochanteric fracture  into the RIGHT femoral head. Diffuse osseous demineralization. No dislocation. Visualized pelvis appears grossly intact. Old healed deformity of the proximal LEFT femur with sclerosis of the LEFT femoral head corresponding to sclerosis and fragmentation of the superior LEFT femoral head and suspected underlying avascular necrosis. IMPRESSION: Post ORIF of intertrochanteric fracture RIGHT femur. Osseous demineralization with posttraumatic deformity and suspect underlying avascular necrosis at the LEFT femoral head. Electronically Signed   By: Lavonia Dana M.D.   On: 12/25/2018 16:16   DG CHEST PORT 1 VIEW  Result Date: 12/26/2018 CLINICAL DATA:  Hypoxia and fever, lethargic. EXAM: PORTABLE CHEST 1 VIEW COMPARISON:  12/24/2018, 06/02/2018 and 01/05/2018 FINDINGS: Lungs are hypoinflated with minimal opacification over the left retrocardiac region which may be due to vascular crowding, atelectasis or early infection. No evidence of effusion. Cardiomediastinal silhouette is within normal. There are multiple bilateral rib fractures. IMPRESSION: Hypoinflated lungs with mild opacification in the left retrocardiac region which may be due to early infection, vascular crowding or atelectasis. Electronically Signed   By: Marin Olp M.D.   On: 12/26/2018 05:57   DG Chest Port 1 View  Result Date: 12/24/2018 CLINICAL DATA:  Fall EXAM: PORTABLE CHEST 1 VIEW COMPARISON:  Chest x-ray dated 06/15/2018. FINDINGS: Stable cardiomegaly. Lungs are clear. No pleural effusion or pneumothorax is seen. Subtle deformity within the posterior LEFT fifth rib, not seen on previous exams, suspicious for acute rib fracture. Additional deformity of the posterior LEFT seventh rib is likely old and seen on earlier chest CT of 01/02/2018. IMPRESSION: 1. No active disease. No evidence of pneumonia or pulmonary edema. 2. Slightly displaced fracture of the posterior LEFT fifth rib, most likely acute. Electronically Signed   By: Franki Cabot  M.D.   On: 12/24/2018 14:08   DG C-Arm 1-60 Min  Result Date: 12/25/2018 CLINICAL DATA:  Internal fixation EXAM: RIGHT FEMUR 2 VIEWS; DG C-ARM 1-60 MIN COMPARISON:  12/24/2018 FINDINGS: Interval internal fixation across the right femoral intertrochanteric fracture. Near anatomic alignment. No hardware bony complicating feature. IMPRESSION: Internal fixation across right intertrochanteric fracture. No visible complicating feature. Electronically Signed   By: Rolm Baptise M.D.   On: 12/25/2018 14:46   ECHOCARDIOGRAM COMPLETE  Result Date: 12/24/2018   ECHOCARDIOGRAM REPORT   Patient Name:   [name] Date of Exam: 12/24/2018 Medical Rec #:  [id number]         Height:       70.0 in Accession #:    [id number]        Weight:       235.0 lb Date of Birth:  1965-05-26         BSA:          2.24 m Patient Age:    54 years          BP:           117/75 mmHg Patient Gender: F                 HR:           83 bpm. Exam Location:  Inpatient Procedure: 2D Echo, Color Doppler and Cardiac Doppler STAT ECHO Indications:    R94.31 Abnormal EKG, Dizziness  History:        Patient has prior history of Echocardiogram examinations, most                 recent 08/30/2017. CAD, COPD; Risk Factors:Hypertension, Diabetes  and Dyslipidemia.  Sonographer:    Raquel Sarna Senior RDCS Referring Phys: [phone] Marylynn Pearson Kasha Howeth  Sonographer Comments: Technically difficult study due to patient body habitus. IMPRESSIONS  1. Left ventricular ejection fraction, by visual estimation, is 60 to 65%. The left ventricle has normal function. There is mildly increased left ventricular hypertrophy.  2. Global right ventricle has normal systolic function.The right ventricular size is mildly enlarged.  3. Left atrial size was normal.  4. Right atrial size was normal.  5. The mitral valve is normal in structure. No evidence of mitral valve regurgitation.  6. The tricuspid valve is normal in structure.  7. The aortic valve is tricuspid. Aortic  valve regurgitation is not visualized. No evidence of aortic valve stenosis.  8. The pulmonic valve was not well visualized. Pulmonic valve regurgitation is not visualized.  9. The inferior vena cava is normal in size with greater than 50% respiratory variability, suggesting right atrial pressure of 3 mmHg. FINDINGS  Left Ventricle: Left ventricular ejection fraction, by visual estimation, is 60 to 65%. The left ventricle has normal function. The left ventricle has no regional wall motion abnormalities. The left ventricular internal cavity size was the left ventricle is normal in size. There is mildly increased left ventricular hypertrophy. Left ventricular diastolic parameters were normal. Right Ventricle: The right ventricular size is mildly enlarged. No increase in right ventricular wall thickness. Global RV systolic function is has normal systolic function. Left Atrium: Left atrial size was normal in size. Right Atrium: Right atrial size was normal in size Pericardium: Trivial pericardial effusion is present. Mitral Valve: The mitral valve is normal in structure. No evidence of mitral valve regurgitation. Tricuspid Valve: The tricuspid valve is normal in structure. Tricuspid valve regurgitation is not demonstrated. Aortic Valve: The aortic valve is tricuspid. Aortic valve regurgitation is not visualized. The aortic valve is structurally normal, with no evidence of sclerosis or stenosis. Pulmonic Valve: The pulmonic valve was not well visualized. Pulmonic valve regurgitation is not visualized. Pulmonic regurgitation is not visualized. Aorta: The aortic root is normal in size and structure. Venous: The inferior vena cava is normal in size with greater than 50% respiratory variability, suggesting right atrial pressure of 3 mmHg. IAS/Shunts: The interatrial septum was not well visualized.  LEFT VENTRICLE PLAX 2D LVIDd:         4.60 cm  Diastology LVIDs:         3.30 cm  LV e' lateral:   6.53 cm/s LV PW:         0.90  cm  LV E/e' lateral: 9.4 LV IVS:        0.70 cm  LV e' medial:    5.44 cm/s LVOT diam:     2.00 cm  LV E/e' medial:  11.3 LV SV:         53 ml LV SV Index:   22.83 LVOT Area:     3.14 cm  RIGHT VENTRICLE RV S prime:     10.10 cm/s TAPSE (M-mode): 1.8 cm LEFT ATRIUM           Index      RIGHT ATRIUM           Index LA diam:      3.00 cm 1.34 cm/m RA Area:     16.00 cm LA Vol (A4C): 21.1 ml 9.44 ml/m RA Volume:   39.20 ml  17.54 ml/m  AORTIC VALVE LVOT Vmax:   71.70 cm/s LVOT Vmean:  54.000 cm/s LVOT VTI:  0.140 m  AORTA Ao Root diam: 2.90 cm MITRAL VALVE MV Area (PHT): 3.17 cm             SHUNTS MV PHT:        69.31 msec           Systemic VTI:  0.14 m MV Decel Time: 239 msec             Systemic Diam: 2.00 cm MV E velocity: 61.60 cm/s 103 cm/s MV A velocity: 83.40 cm/s 70.3 cm/s MV E/A ratio:  0.74       1.5  Erin Milian MD Electronically signed by Erin Milian MD Signature Date/Time: 12/24/2018/9:56:09 PM    Final    DG HIP PORT UNILAT WITH PELVIS 1V RIGHT  Result Date: 12/24/2018 CLINICAL DATA:  Right hip pain secondary to a fall. EXAM: DG HIP (WITH OR WITHOUT PELVIS) 1V PORT RIGHT COMPARISON:  Radiographs dated 05/09/2014 FINDINGS: There is an abnormal configuration of the proximal right femur suggesting an intertrochanteric fracture of the proximal right femur but this is not definitive does of the hip flexion. There is also a deformity of the left ischium and ilium and medial wall of the left acetabulum which appears different than the prior radiograph of 04/03/2018. Old healed fracture of the proximal left femur. IMPRESSION: Possible left pelvic bone fractures. Probable intertrochanteric fracture of the proximal right femur as described above. CT scan of the pelvis and left hip recommended for further evaluation. Electronically Signed   By: Lorriane Shire M.D.   On: 12/24/2018 14:28   DG FEMUR, MIN 2 VIEWS RIGHT  Result Date: 12/25/2018 CLINICAL DATA:  Internal fixation EXAM:  RIGHT FEMUR 2 VIEWS; DG C-ARM 1-60 MIN COMPARISON:  12/24/2018 FINDINGS: Interval internal fixation across the right femoral intertrochanteric fracture. Near anatomic alignment. No hardware bony complicating feature. IMPRESSION: Internal fixation across right intertrochanteric fracture. No visible complicating feature. Electronically Signed   By: Rolm Baptise M.D.   On: 12/25/2018 14:46   DG FEMUR PORT, MIN 2 VIEWS RIGHT  Result Date: 12/25/2018 CLINICAL DATA:  Post RIGHT femoral nail EXAM: RIGHT FEMUR PORTABLE 2 VIEW COMPARISON:  Portable exam 1532 hours compared to RIGHT hip radiographs of 12/24/2018 FINDINGS: IM nail with distal locking screw seen within the RIGHT femur. Diffuse osseous demineralization. Joint space narrowing RIGHT knee. No fracture or dislocation identified at the mid to distal femur. Intertrochanteric fracture RIGHT femur imaged and reported separately. IMPRESSION: Post placement of IM nail with distal locking screw. Osseous demineralization without additional femoral abnormality. Electronically Signed   By: Lavonia Dana M.D.   On: 12/25/2018 16:18    Microbiology: Recent Results (from the past 240 hour(s))  SARS CORONAVIRUS 2 (TAT 6-24 HRS) Nasopharyngeal Nasopharyngeal Swab     Status: None   Collection Time: 12/31/18 12:36 PM   Specimen: Nasopharyngeal Swab  Result Value Ref Range Status   SARS Coronavirus 2 NEGATIVE NEGATIVE Final    Comment: (NOTE) SARS-CoV-2 target nucleic acids are NOT DETECTED. The SARS-CoV-2 RNA is generally detectable in upper and lower respiratory specimens during the acute phase of infection. Negative results do not preclude SARS-CoV-2 infection, do not rule out co-infections with other pathogens, and should not be used as the sole basis for treatment or other patient management decisions. Negative results must be combined with clinical observations, patient history, and epidemiological information. The expected result is Negative. Fact Sheet  for Patients: SugarRoll.be Fact Sheet for Healthcare Providers: https://www.woods-mathews.com/ This test is not yet approved or  cleared by the [place] and  has been authorized for detection and/or diagnosis of SARS-CoV-2 by FDA under an Emergency Use Authorization (EUA). This EUA will remain  in effect (meaning this test can be used) for the duration of the COVID-19 declaration under Section 56 4(b)(1) of the Act, 21 U.S.C. section 360bbb-3(b)(1), unless the authorization is terminated or revoked sooner. Performed at [facility], [place] [address], [place], [place] [address]   SARS CORONAVIRUS 2 (TAT 6-24 HRS) Nasopharyngeal Nasopharyngeal Swab     Status: None   Collection Time: 01/08/19  4:30 PM   Specimen: Nasopharyngeal Swab  Result Value Ref Range Status   SARS Coronavirus 2 NEGATIVE NEGATIVE Final    Comment: (NOTE) SARS-CoV-2 target nucleic acids are NOT DETECTED. The SARS-CoV-2 RNA is generally detectable in upper and lower respiratory specimens during the acute phase of infection. Negative results do not preclude SARS-CoV-2 infection, do not rule out co-infections with other pathogens, and should not be used as the sole basis for treatment or other patient management decisions. Negative results must be combined with clinical observations, patient history, and epidemiological information. The expected result is Negative. Fact Sheet for Patients: SugarRoll.be Fact Sheet for Healthcare Providers: https://www.woods-mathews.com/ This test is not yet approved or cleared by the [place] FDA and  has been authorized for detection and/or diagnosis of SARS-CoV-2 by FDA under an Emergency Use Authorization (EUA). This EUA will remain  in effect (meaning this test can be used) for the duration of the COVID-19 declaration under Section 56 4(b)(1) of the Act, 21 U.S.C. section  360bbb-3(b)(1), unless the authorization is terminated or revoked sooner. Performed at [facility], [place] [address] [place], [place] [address]      Labs: Basic Metabolic Panel: Recent Labs  Lab 01/03/19 1400 01/08/19 0636 01/09/19 0523  NA 135 135 133*  K 4.4 4.3 5.2*  CL 97* 96* 97*  CO2 29 31 23   GLUCOSE 265* 314* 259*  BUN 26* 24* 33*  CREATININE 1.30* 1.04* 1.15*  CALCIUM 8.2* 8.8* 8.5*  MG  --  1.7  --    Liver Function Tests: No results for input(s): AST, ALT, ALKPHOS, BILITOT, PROT, ALBUMIN in the last 168 hours. No results for input(s): LIPASE, AMYLASE in the last 168 hours. No results for input(s): AMMONIA in the last 168 hours. CBC: Recent Labs  Lab 01/08/19 0636  WBC 7.1  NEUTROABS 4.2  HGB 10.7*  HCT 34.2*  MCV 82.4  PLT 384   Cardiac Enzymes: No results for input(s): CKTOTAL, CKMB, CKMBINDEX, TROPONINI in the last 168 hours. BNP: BNP (last 3 results) No results for input(s): BNP in the last 8760 hours.  ProBNP (last 3 results) No results for input(s): PROBNP in the last 8760 hours.  CBG: Recent Labs  Lab 01/08/19 0743 01/08/19 1126 01/08/19 1638 01/08/19 2126 01/09/19 0756  GLUCAP 325* 164* 237* 151* 276*       Signed:  Florencia Reasons MD, PhD, FACP  [facility] 01/09/2019, 11:05 AM

## 2019-01-09 NOTE — Progress Notes (Signed)
Report called to [facility]. Carroll Kinds RN

## 2019-01-09 NOTE — TOC Transition Note (Signed)
Transition of Care [facility]) - CM/SW Discharge Note   Patient Details  Name: [name] MRN: [id number] Date of Birth: 04/11/1965  [facility]) CM/SW Contact:  Alexander Mt, [place] Phone Number: 01/09/2019, 12:09 PM   Clinical Narrative:    CSW has arranged discharge to [facility].  Pt did not pick up the phone when CSW called, pt RN went into room, pt aware that discharge today. CSW and RN Alyse Low received verbal permission from pt to discuss bringing pt medication Biktarvy to SNF from home. Pt states that it is too far for family to bring but when CSW called and spoke with family they are amenable to bringing it to the SNF.   TOC team has arranged PTAR, controlled medication script on chart per MD, PTAR papers sent to floor. PTAR running behind, requested next available.    Final next level of care: Skilled Nursing Facility Barriers to Discharge: Barriers Resolved   Patient Goals and CMS Choice Patient states their goals for this hospitalization and ongoing recovery are:: "to go to rehab"   Choice offered to / list presented to : Adult Children, Patient  Discharge Placement PASRR number recieved: 01/01/19            Patient chooses bed at: [facility] Patient to be transferred to facility by: [place] Name of family member notified: pt daughter in law [name] Patient and family notified of of transfer: 01/09/19  Discharge Plan and Services In-house Referral: Clinical Social Work   Post Acute Care Choice: [place]             Readmission Risk Interventions Readmission Risk Prevention Plan 06/17/2018  Transportation Screening Complete  Medication Review Press photographer) Complete  PCP or Specialist appointment within 3-5 days of discharge Complete  HRI or Home Care Consult Complete  SW Recovery Care/Counseling Consult Complete  [employer] Complete  Some recent  data might be hidden

## 2019-01-21 ENCOUNTER — Telehealth: Payer: Self-pay | Admitting: Orthopaedic Surgery

## 2019-01-21 NOTE — Telephone Encounter (Signed)
That is fine the nurses at the facility can take them out

## 2019-01-21 NOTE — Telephone Encounter (Signed)
Patient called advised se tested positive for Covid-19 Yesterday. Patient have sutures that need to come out. The number to contact patient is [phone]

## 2019-01-22 ENCOUNTER — Telehealth: Payer: Self-pay | Admitting: Orthopaedic Surgery

## 2019-01-22 ENCOUNTER — Ambulatory Visit: Payer: Medicare HMO | Admitting: Infectious Diseases

## 2019-01-22 NOTE — Telephone Encounter (Signed)
LMOM for patient letting her know the below message  

## 2019-01-22 NOTE — Telephone Encounter (Signed)
[name] from [facility] called.   The patient has contracted COVID and will not be able to attend her two wk hospital follow up. She still needs sutures removed and they are able to do it at their facility with the green light from Dr.XU  Call back number: [phone]

## 2019-01-23 ENCOUNTER — Inpatient Hospital Stay: Payer: Medicare HMO | Admitting: Orthopaedic Surgery

## 2019-01-23 NOTE — Telephone Encounter (Signed)
I left voicemail for [name] advising.

## 2019-01-23 NOTE — Telephone Encounter (Signed)
Yes it's fine to remove

## 2019-01-26 ENCOUNTER — Telehealth: Payer: Self-pay | Admitting: Endocrinology

## 2019-01-26 ENCOUNTER — Telehealth: Payer: Self-pay | Admitting: Orthopaedic Surgery

## 2019-01-26 NOTE — Telephone Encounter (Signed)
[name] called to advise that she is in rehab following a broken hip.  She wants advice about the different insulins they have added to her insulin therapy.  She also wants to try and get a CGM because they are not checking blood sugars as often as she feels like they should.    Her number for call back is [phone]

## 2019-01-26 NOTE — Telephone Encounter (Signed)
Called pt and requested a call back to discuss further exactly what pt is requesting from Dr. Dwyane Dee.

## 2019-01-26 NOTE — Telephone Encounter (Signed)
Patient called and stated that she would like for Dr Erlinda Hong or Ria Comment to call her back about her foot and ankle(right). Patient is in a facility @ [facility] [phone]

## 2019-01-27 NOTE — Telephone Encounter (Signed)
Need to schedule an office visit, virtual is okay.  [employer] will only pay for freestyle Elenor Legato if she is checking 4 times a day and she will have to get it from her DME supplier

## 2019-01-27 NOTE — Telephone Encounter (Signed)
I spoke to patient

## 2019-01-27 NOTE — Telephone Encounter (Signed)
Patient returning Noahs call-she could not get the phone she was having staples removed-she would like a call back as soon as possible

## 2019-01-27 NOTE — Telephone Encounter (Signed)
Pt called and stated that she is a resident at [facility].  Sugar is high in the am, but after taking Jardiance, blood sugar begins to go down throughout the day.  Pt would like to know if Dr. Dwyane Dee will sent an rx for freestyle Elenor Legato so patient can check her sugar herself because she states that the nursing staff only check her blood sugar TID, and she feels like it should be checked more often.

## 2019-01-27 NOTE — Telephone Encounter (Signed)
See message.

## 2019-01-28 ENCOUNTER — Inpatient Hospital Stay: Payer: Medicare HMO | Admitting: Orthopaedic Surgery

## 2019-01-28 NOTE — Telephone Encounter (Signed)
Called pt and gave her MD message below. Pt requested I call back at a later time today to provide her with the phone number as she did not have access to pen and paper at this time.  Dr. Dwyane Dee, are you okay with sending orders to [facility], where pt is currently a resident to have them change her CBG schedule to QID? Pt requests this because she does not feel like her blood sugar gets checked enough. In addition, it would serve as documentation if insurance requests it for the [employer]. Will schedule pt for virtual visit when I call her back and give her phone number for DME supplier.

## 2019-01-28 NOTE — Telephone Encounter (Signed)
Called pt and provided her with [place] supply's number, and she will call and set up an account. She was also scheduled for a virtual visit.

## 2019-01-28 NOTE — Telephone Encounter (Signed)
Noted. Will send order to check QID and request to have all necessary info faxed the day prior to her visit once scheduled.

## 2019-01-28 NOTE — Telephone Encounter (Signed)
We can instruct them to check blood sugars 4 times a day but will need to have blood sugar records when she is seen for the visit

## 2019-01-30 ENCOUNTER — Inpatient Hospital Stay: Payer: Medicare HMO | Admitting: Orthopaedic Surgery

## 2019-02-02 ENCOUNTER — Telehealth: Payer: Self-pay | Admitting: Endocrinology

## 2019-02-02 NOTE — Telephone Encounter (Signed)
na

## 2019-02-04 ENCOUNTER — Telehealth: Payer: Self-pay | Admitting: Endocrinology

## 2019-02-04 NOTE — Telephone Encounter (Signed)
Pt is currently in a nursing and rehab facility. Pt currently has an upcoming appt to see Dr. Dwyane Dee via virtual visit. All questions pt has regarding medication management can be discussed at upcoming visit.

## 2019-02-04 NOTE — Telephone Encounter (Signed)
Patient has been in hospital since first week of January due to falling, she is not sure when she will be able to come home and she said her medications have been a little out of order and would like to be called by [name] or [name] so she can be advised. Ph# [phone]

## 2019-02-09 ENCOUNTER — Telehealth: Payer: Self-pay | Admitting: Orthopaedic Surgery

## 2019-02-09 NOTE — Telephone Encounter (Signed)
Patient called.   She has a blister covering the bottom of the entire heel of her foot. She would like a call back to discuss her next course of action.   Call back number: [phone]

## 2019-02-09 NOTE — Telephone Encounter (Signed)
She should come in for an appt to be evaluated some time this week.

## 2019-02-09 NOTE — Telephone Encounter (Signed)
Pt called stating she called earlier today and still has not received a call back; I offered to make an appt. with Dr. Erlinda Hong and she declined. Pt is insisting on a call back.   [phone]

## 2019-02-09 NOTE — Telephone Encounter (Signed)
See other message. Msg sent to Dr Erlinda Hong.

## 2019-02-10 ENCOUNTER — Telehealth: Payer: Self-pay | Admitting: Orthopaedic Surgery

## 2019-02-10 NOTE — Telephone Encounter (Signed)
Patient called requested update for a call back from Dr. Erlinda Hong or nurse. Was explained to patient that Dr. Erlinda Hong would like for her to come in and have her foot looked at. Patient stated to send message to Dr. Erlinda Hong that she is unavailable to come for an appointment at this time. Patient stated to call back some time next week to see if transportation is available for her to come in for appointment. Patient phone number is [phone].

## 2019-02-12 ENCOUNTER — Telehealth: Payer: Self-pay | Admitting: Orthopaedic Surgery

## 2019-02-12 NOTE — Telephone Encounter (Signed)
Called patient advised she is ib a rehab center in [place]) Patient said she cannot make an appointment right now. Patient said the nurse said her foot looks good. The number to contact patient if needed is [phone]

## 2019-02-12 NOTE — Telephone Encounter (Signed)
Ok, definitely needs to be seen

## 2019-02-12 NOTE — Telephone Encounter (Signed)
Please make appt

## 2019-02-13 NOTE — Telephone Encounter (Signed)
ok 

## 2019-02-18 ENCOUNTER — Telehealth: Payer: Self-pay | Admitting: Endocrinology

## 2019-02-18 ENCOUNTER — Telehealth: Payer: Self-pay

## 2019-02-18 NOTE — Telephone Encounter (Signed)
Called pt and advised her that Dr. Dwyane Dee will not change her medication without first knowing how her blood sugar trends are. Pt will wait until her appt with Dr. Dwyane Dee but will contact this office if her readings become too high or too low.

## 2019-02-18 NOTE — Telephone Encounter (Signed)
Patient requests to be called at ph# [phone] re: Patient was treated at [facility] who put  patient on Lantus and Novolog which is different from what Dr. Dwyane Dee prescribed for patient which is Toujeo and Humalin R500.

## 2019-02-18 NOTE — Telephone Encounter (Signed)
Patient called office today requesting refills on Biktarvy. States she was recently released from [facility] on 2/14.  States they were giving her Genvoya and not Biktarvy. Patient is unable to come into office for appointment's right now due to transportation and mobility issues. Would like to know if MD will approve refills on Biktarvy until she can be seen in office. Denies missing any doses of Genvoya. Will forward message to MD to advise on refills. [place]

## 2019-02-19 NOTE — Telephone Encounter (Signed)
That would be excellent Thank you!

## 2019-02-24 ENCOUNTER — Telehealth: Payer: Self-pay

## 2019-02-24 ENCOUNTER — Other Ambulatory Visit: Payer: Self-pay

## 2019-02-24 MED ORDER — EMPAGLIFLOZIN 10 MG PO TABS: 10.0000 mg | ORAL_TABLET | Freq: Every day | ORAL | 0 refills | Status: DC

## 2019-02-24 NOTE — Telephone Encounter (Signed)
MEDICATION: Jardiance   PHARMACY:  [facility]   IS THIS A 90 DAY SUPPLY : yes   IS PATIENT OUT OF MEDICATION: yes  IF NOT; HOW MUCH IS LEFT: none  LAST APPOINTMENT DATE: @2 /17/2021   NEXT APPOINTMENT DATE:@3 /08/2019  DO WE HAVE YOUR PERMISSION TO LEAVE A DETAILED MESSAGE:  OTHER COMMENTS:  yes   **Let patient know to contact pharmacy at the end of the day to make sure medication is ready. **  ** Please notify patient to allow 48-72 hours to process**  **Encourage patient to contact the pharmacy for refills or they can request refills through [facility]**

## 2019-02-24 NOTE — Telephone Encounter (Signed)
Rx sent 

## 2019-03-06 ENCOUNTER — Ambulatory Visit: Payer: Medicare HMO | Admitting: Physician Assistant

## 2019-03-09 ENCOUNTER — Other Ambulatory Visit: Payer: Self-pay

## 2019-03-09 ENCOUNTER — Ambulatory Visit (INDEPENDENT_AMBULATORY_CARE_PROVIDER_SITE_OTHER): Payer: Medicare Other | Admitting: Endocrinology

## 2019-03-09 ENCOUNTER — Encounter: Payer: Self-pay | Admitting: Endocrinology

## 2019-03-09 DIAGNOSIS — E782 Mixed hyperlipidemia: Secondary | ICD-10-CM | POA: Diagnosis not present

## 2019-03-09 DIAGNOSIS — E1165 Type 2 diabetes mellitus with hyperglycemia: Secondary | ICD-10-CM | POA: Diagnosis not present

## 2019-03-09 DIAGNOSIS — Z794 Long term (current) use of insulin: Secondary | ICD-10-CM | POA: Diagnosis not present

## 2019-03-09 MED ORDER — EMPAGLIFLOZIN 10 MG PO TABS: 10.0000 mg | ORAL_TABLET | Freq: Every day | ORAL | 0 refills | Status: DC

## 2019-03-09 MED ORDER — TOUJEO SOLOSTAR 300 UNIT/ML ~~LOC~~ SOPN: 25.0000 [IU] | PEN_INJECTOR | Freq: Two times a day (BID) | SUBCUTANEOUS | 3 refills | Status: DC

## 2019-03-09 MED ORDER — HUMULIN R U-500 KWIKPEN 500 UNIT/ML ~~LOC~~ SOPN: PEN_INJECTOR | SUBCUTANEOUS | 1 refills | Status: DC

## 2019-03-09 NOTE — Progress Notes (Signed)
Patient ID: [name], female   DOB: 21-Jun-1965, 54 y.o.   MRN: [id number]           Reason for Appointment: Follow-up  for Type 2 Diabetes   I connected with the above-named patient by video enabled telemedicine application and verified that I am speaking with the correct person. The patient was explained the limitations of evaluation and management by telemedicine and the availability of in person appointments.  Patient also understood that there may be a patient responsible charge related to this service . Location of the patient: Patient's home . Location of the provider: Physician office Only the patient and myself were participating in the encounter The patient understood the above statements and agreed to proceed.  History of Present Illness:          Date of diagnosis of type 2 diabetes mellitus: 2000       Background history:     The patient was apparently diagnosed incidentally with her diabetes 16 years ago 16 years ago, was asymptomatic. She took metformin and possibly other oral hypoglycemic drugs but subsequently went on insulin in 2005 She was taking various insulin regimens in the past  She was being treated by an endocrinologist some time ago and he switched her To Toujeo and also tried her on Byetta.  She thinks that she had better blood sugars with Byetta but she did not follow-up  Novolog was switched to U-500 insulin in 3/17   Recent history:   INSULIN regimen is:  Humulin R U-500: 35 units before dinner, Lantus 25 units twice daily   Non-insulin hypoglycemic drugs the patient is taking are: Jardiance 10 mg    Her A1c has been persistently high but her previous reading was 7.1 in 12/20   Current management, blood sugar patterns and problems identified:  She has not been seen in follow-up since 09/2018  She is going back to her home from her nursing home stay  Her Toujeo was changed to Lantus and she appears to be taking only half the previous  dose  However no labs are available to assess her overall level of control  She is checking blood sugars mostly before her first meal which is midday and sporadically before dinner or other time  She is not taking any mealtime coverage for her first meal because she thought it was unnecessary to take the regular insulin when her blood sugars are normal  Blood sugars are relatively stable before her first meal  However most of her sugars around dinnertime are over 200  No hypoglycemia either  Has only 1 reading after dinner  She is not able to do much exercise although she thinks she is fairly active with housework even though she is mostly in a wheelchair  She is asking about doing a continuous glucose monitor    Side effects from medications have been: None  Compliance with the medical regimen:  fair  Glucose monitoring:  done with accu meter, 1-3 times a day     PRE-MEAL Fasting Lunch Dinner Bedtime Overall  Glucose range:  94-142   154-292  109, 182   Mean/median:     ?   POST-MEAL PC Breakfast PC Lunch PC Dinner  Glucose range:  291  94  150  Mean/median:         Self-care:   Typical meal intake: Breakfast is mostly a yogurt, fruit; or egg/toast  First meal at about 11 AM, has mixed meal at dinnertime  6 pm, usually 2 meals a day Has variable amounts of snacks.              Dietician visit, most recent: At [facility]                Weight history:  Wt Readings from Last 3 Encounters:  01/09/19 192 lb 7.4 oz (87.3 kg)  08/12/18 245 lb (111.1 kg)  07/01/18 234 lb (106.1 kg)    Glycemic control: Baseline A1c is 11 in 8/16   Lab Results  Component Value Date   HGBA1C 7.1 (H) 12/24/2018   HGBA1C 8.3 (H) 07/02/2018   HGBA1C 8.1 07/02/2018   Lab Results  Component Value Date   MICROALBUR 1.2 03/05/2016   LDLCALC 147 (H) 05/21/2017   CREATININE 1.15 (H) 01/09/2019     Other active problems: See review of systems    Allergies as of 03/09/2019        Reactions   Cortizone-10 [hydrocortisone] Rash   Per patient, given local injection at knee and developed rash at local site.       Medication List       Accurate as of March 09, 2019  1:18 PM. If you have any questions, ask your nurse or doctor.        STOP taking these medications   enoxaparin 40 MG/0.4ML injection Commonly known as: LOVENOX Stopped by:  , MD   insulin aspart 100 UNIT/ML injection Commonly known as: novoLOG Stopped by:  , MD     TAKE these medications   Accu-Chek Aviva device Use to test blood sugar daily   Accu-Chek Aviva test strip Generic drug: glucose blood Use as instructed to check blood sugar 2 times daily.   acetaminophen 325 MG tablet Commonly known as: TYLENOL Take 1-2 tablets (325-650 mg total) by mouth every 6 (six) hours as needed for mild pain (pain score 1-3 or temp > 100.5).   albuterol 108 (90 Base) MCG/ACT inhaler Commonly known as: VENTOLIN HFA INHALE 2 PUFFS INTO THE LUNGS EVERY 6 HOURS AS NEEDED FOR WHEEZING OR SHORTNESS OF BREATH What changed:   how much to take  how to take this  when to take this  reasons to take this  additional instructions   bictegravir-emtricitabine-tenofovir AF 50-200-25 MG Tabs tablet Commonly known as: BIKTARVY Take 1 tablet by mouth daily.   empagliflozin 10 MG Tabs tablet Commonly known as: JARDIANCE Take 10 mg by mouth daily.   esomeprazole 20 MG packet Commonly known as: NexIUM Take 20 mg by mouth daily before breakfast.   fluticasone 50 MCG/ACT nasal spray Commonly known as: FLONASE Place 1 spray into both nostrils daily.   HumuLIN R U-500 KwikPen 500 UNIT/ML kwikpen Generic drug: insulin regular human CONCENTRATED Inject into the skin every evening. Inject 35 under the skin daily before dinner.   insulin glargine 100 UNIT/ML injection Commonly known as: LANTUS Inject 0.25 mLs (25 Units total) into the skin 2 (two) times daily.   ipratropium-albuterol  0.5-2.5 (3) MG/3ML Soln Commonly known as: DUONEB Take 3 mLs by nebulization every 6 (six) hours. What changed:   when to take this  reasons to take this   oxyCODONE-acetaminophen 5-325 MG tablet Commonly known as: Percocet Take 1 tablet by mouth every 8 (eight) hours as needed for severe pain.   pregabalin 225 MG capsule Commonly known as: LYRICA Take 225 mg by mouth 2 (two) times daily.   rosuvastatin 20 MG tablet Commonly known as: CRESTOR Take 1 tablet (  20 mg total) by mouth daily. Reported on 05/02/2015   senna-docusate 8.6-50 MG tablet Commonly known as: Senokot-S Take 1 tablet by mouth at bedtime.   sertraline 50 MG tablet Commonly known as: ZOLOFT Take 50 mg by mouth daily. (Give with 135m tablet for a total of 1523m       Allergies:  Allergies  Allergen Reactions  . Cortizone-10 [Hydrocortisone] Rash    Per patient, given local injection at knee and developed rash at local site.     Past Medical History:  Diagnosis Date  . Acute metabolic encephalopathy 05/03/20/0254. Acute on chronic respiratory failure with hypoxia (HCGlendale5/19/2018  . Anxiety   . ARF (acute renal failure) (HCLongport5/02/2014  . Arthritis    LEFT HIP  . Asthma    HOSPITALIZED WITH EXCERBATION OF ASTHMA - AND BRONCHITIS AND THE FLU DEC 2013  . Asthma exacerbation attacks 12/28/2011  . Bronchitis 12/28/2011  . CAP (community acquired pneumonia) 07/25/2012  . Chronic kidney disease (CKD), stage IV (severe) (HCFruitland11/09/2014  . Class 3 obesity due to excess calories with serious comorbidity and body mass index (BMI) of 40.0 to 44.9 in adult   . Colles' fracture of left radius   . COPD (chronic obstructive pulmonary disease) (HCCompton  . Depression   . Diabetes mellitus    ON INSULIN AND ORAL MEDICATIONS  . Diabetes mellitus type 2 with complications, uncontrolled (HCKiowa4/19/2010   Qualifier: Diagnosis of  By: VoTomma LightningD, KeClaiborne Billings   . Diabetic foot infection (HCDanvers11/08/2014  . Diabetic ketoacidosis  without coma associated with type 2 diabetes mellitus (HCPrentiss  . Diabetic neuropathy (HCYork11/09/2014  . Diarrhea 08/30/2008   Qualifier: Diagnosis of  By: VoTomma LightningD, KeClaiborne Billings  . Diastolic dysfunction 1127/0/6237. Difficult intravenous access   . DKA, type 2 (HCAlpine4/04/2016  . Dyslipidemia 12/23/2008   Qualifier: Diagnosis of  By: VoTomma LightningD, KeClaiborne Billings  . Essential hypertension 04/19/2008   Qualifier: Diagnosis of  By: VoTomma LightningD, KeClaiborne Billings  . FATIGUE 07/12/2008   Qualifier: Diagnosis of  By: VoTomma LightningD, KeClaiborne Billings  . Femoral neck fracture (HCLamont7/15/2013  . Femur fracture, left (HCPierron7/07/2014  . Fever   . Fracture of distal femur (HCKarlstad7/07/2014  . GERD (gastroesophageal reflux disease)   . Hereditary and idiopathic peripheral neuropathy 04/19/2008   Qualifier: Diagnosis of  By: VoTomma LightningD, KeClaiborne Billings  . Hip fracture, left (HCJoseph7/15/2013  . Hip pain   . HIV infection (HCEagle2000  . Human immunodeficiency virus (HIV) disease (HCHayfield4/19/2010   [id number] +   . Hyperlipidemia   . Hypertension   . Hyponatremia 12/29/2011  . Influenza A 12/29/2011  . Influenza B   . Insomnia 06/12/2016  . Left hip pain 05/03/2014  . Mood disorder (HCBeale AFB4/19/2010   Qualifier: Diagnosis of  By: VoTomma LightningD, KeClaiborne Billings  . Neuropathy    NEUROPATHY HANDS AND FEET  . NSTEMI (non-ST elevated myocardial infarction) (HCToone5/19/2018  . Obesity hypoventilation syndrome (HCLeChee  . Osteoporosis 07/08/2014  . Pain    SEVERE PAIN LEFT HIP - HX OF LEFT HIP PINNING JULY 2013  . Perimenopausal symptoms 02/13/2012   LMP around 2011. On estrace and provera since around 2012 for hot flashes, mood swings. Estrace 2 mg daily, Provera 2.5 mg for 5 days each month.   . Repeated falls 07/30/2011  . Sepsis (HCGlen Aubrey4/04/2016  .  Shortness of breath    ALLERGIES ARE "ACTING UP"  . SOB (shortness of breath)   . THRUSH 05/20/2008   Qualifier: Diagnosis of  By: Vollmer MD, Kelly      . Tobacco use disorder 04/14/2010    Past Surgical History:    Procedure Laterality Date  . AMPUTATION Left 11/18/2014   Procedure: LEFT BELOW KNEE AMPUTATION;  Surgeon: Naiping M Xu, MD;  [facility];  Service: Orthopedics;  Laterality: Left;  . CAST APPLICATION Left 05/05/2014   Procedure: CAST APPLICATION (FIBERGLASS);  Surgeon: Ronald Gioffre, MD;  [facility];  Service: Orthopedics;  Laterality: Left;  CLOSED REDUCTION OF LEFT COLLES FRACTURE WITH SHORT ARM CAST  . EXTERNAL FIXATION LEG Right 06/02/2018   Procedure: EXTERNAL FIXATION LEG;  Surgeon: Xu, Naiping M, MD;  Location[facility];  Service: Orthopedics;  Laterality: Right;  . EXTERNAL FIXATION REMOVAL Right 07/01/2018   Procedure: REMOVAL EXTERNAL FIXATION LEG;  Surgeon: Xu, Naiping M, MD;  [facility];  Service: Orthopedics;  Laterality: Right;  . HARDWARE REMOVAL Left 05/05/2014   Procedure: HARDWARE REMOVAL LEFT HIP;  Surgeon: Ronald Gioffre, MD;  [facility];  Service: Orthopedics;  Laterality: Left;  REMOVAL BIOMET 6.5-8.0 CANNULATED SCREW  . HIP ARTHROPLASTY Left 05/05/2014   Procedure: ARTHROPLASTY OPEN REDUCTION INTERNAL FIXATION LEFT HIP AND REMOVAL OF TWO CANNULATED SCREW;  Surgeon: Ronald Gioffre, MD;  [facility];  Service: Orthopedics;  Laterality: Left;  . HIP PINNING,CANNULATED  07/16/2011   Procedure: CANNULATED HIP PINNING;  Surgeon: Frank V Aluisio, MD;  [facility];  Service: Orthopedics;  Laterality: Left;  . HIP PINNING,CANNULATED Left 04/09/2012   Procedure: CANNULATED HIP PINNING AND HARDWARE REVISION;  Surgeon: Frank V Aluisio, MD;  [facility];  Service: Orthopedics;  Laterality: Left;  . I & D EXTREMITY Left 11/16/2014   Procedure:  DEBRIDEMENT OF LEFT FOOT POSSIBLE APPLICATION OF INTEGRIA AND VAC ;  Surgeon: Brinda Thimmappa, MD;  [facility];  Service: Plastics;  Laterality: Left;  . I & D EXTREMITY Right 07/01/2018   Procedure: RIGHT ANKLE EX FIX REMOVAL, IRRIGATION AND DEBRIDEMENT AND SHORT LEG CAST;  Surgeon: Xu, Naiping M, MD;  [facility];  Service: Orthopedics;  Laterality: Right;  . INTRAMEDULLARY (IM) NAIL INTERTROCHANTERIC Right 12/25/2018   Procedure: INTRAMEDULLARY (IM) NAIL INTERTROCHANTRIC;  Surgeon: Xu, Naiping M, MD;  [facility];  Service: Orthopedics;  Laterality: Right;  . PERIPHERAL VASCULAR CATHETERIZATION N/A 11/15/2014   Procedure: Abdominal Aortogram;  Surgeon: Brian L Chen, MD;  [facility];  Service: Cardiovascular;  Laterality: N/A;  . PERIPHERAL VASCULAR CATHETERIZATION  11/15/2014   Procedure: Lower Extremity Angiography;  Surgeon: Brian L Chen, MD;  [facility];  Service: Cardiovascular;;  . PERIPHERAL VASCULAR CATHETERIZATION Left 11/15/2014   Procedure: Peripheral Vascular Intervention;  Surgeon: Brian L Chen, MD;  [facility];  Service: Cardiovascular;  Laterality: Left;  popliteal artery stenting    Family History  Problem Relation Age of Onset  . Diabetes Mother   . Hypertension Mother   . Vision loss Mother   . Heart disease Mother   . Hypertension Father   . Thyroid disease Neg Hx     Social History:  reports that she quit smoking about 9 months ago. Her smoking use included e-cigarettes and cigarettes. She started smoking about 38 years ago. She has a 57.00 pack-year smoking history. She has never used smokeless tobacco. She reports that   she does not drink alcohol or use drugs.    Review of Systems   RENAL function as follows:  Lab Results  Component Value Date   CREATININE 1.15 (H) 01/09/2019   CREATININE 1.04 (H) 01/08/2019   CREATININE 1.30 (H) 01/03/2019    Lipid history: Has been treated with  Fenofibrate and Crestor  LDL is below 100    Lab Results  Component Value Date   CHOL 161 01/21/2018   HDL 47.60 01/21/2018   LDLCALC 147 (H) 05/21/2017   LDLDIRECT 91.0 01/21/2018   TRIG 234.0 (H) 01/21/2018   CHOLHDL 3 01/21/2018           She takes Lyrica for phantom pain of her leg, she is going to a pain clinic for  this  Blood pressure has been variable, not on antihypertensives or ACE inhibitor   BP Readings from Last 3 Encounters:  01/09/19 (!) 92/58  07/04/18 (!) 148/62  06/23/18 118/67    Take Zoloft for depression long-term  She has a history of anemia  Lab Results  Component Value Date   HGB 10.7 (L) 01/08/2019      Physical Examination:  LMP 11/16/2010       ASSESSMENT:  Diabetes type 2, uncontrolled with obesity  See history of present illness for detailed discussion of current diabetes management, blood sugar patterns and problems identified   A1c was last 7.1 and improved However not clear if hemoglobin A1c is affected by her anemia  Her blood sugars are likely consistently high after meals although she is not monitoring postprandial Also not taking any insulin to cover her first meal of the day but only at dinnertime when her blood sugars are mostly over 200 Fasting readings are fairly good with only half the usual dose of basal insulin currently She also can be consistent with taking her U-500 insulin 30-minute before eating  Renal function needs to be rechecked with her continuing on Jardiance  HYPERLIPIDEMIA: Her LDL is 91 but triglycerides are still high, likely needs more weight loss and better diabetes control  PLAN:    INSULIN regimen:  Start taking 25 units of the Humulin R before her first meal consistently If blood sugars are still over 180 mid afternoon she will go up to 30 units but can also reduce it for smaller meals and skip the dose if not eating in the morning Again asked her to check more readings after meals that in the morning and keep her sugars at least under 200 postprandially at night also She will try to eat lower carbohydrate meals and smaller portions with foods such as bagels Since Toujeo will be more consistent that Lantus he will switch and this will likely be covered similarly to Lantus Needs labs to reassess her level of control  and renal function  Leg edema, phantom pains and associated problems: She will follow up with her respective physicians  There are no Patient Instructions on file for this visit.     03/09/2019, 1:18 PM   Note: This office note was prepared with Dragon voice recognition system technology. Any transcriptional errors that result from this process are unintentional.  

## 2019-03-10 IMAGING — CR DG CHEST 2V
2 series · 2 of 2 positions shown · non-contrast
Comparison: Single-view of the chest 02/23/2017, 09/03/2016 and
05/20/2016. CT chest 05/23/2016.

CLINICAL DATA: Shortness of breath. The patient was diagnosed with
the flu and pneumonia 02/23/2017.

EXAM:
CHEST  2 VIEW

[w chest lat]
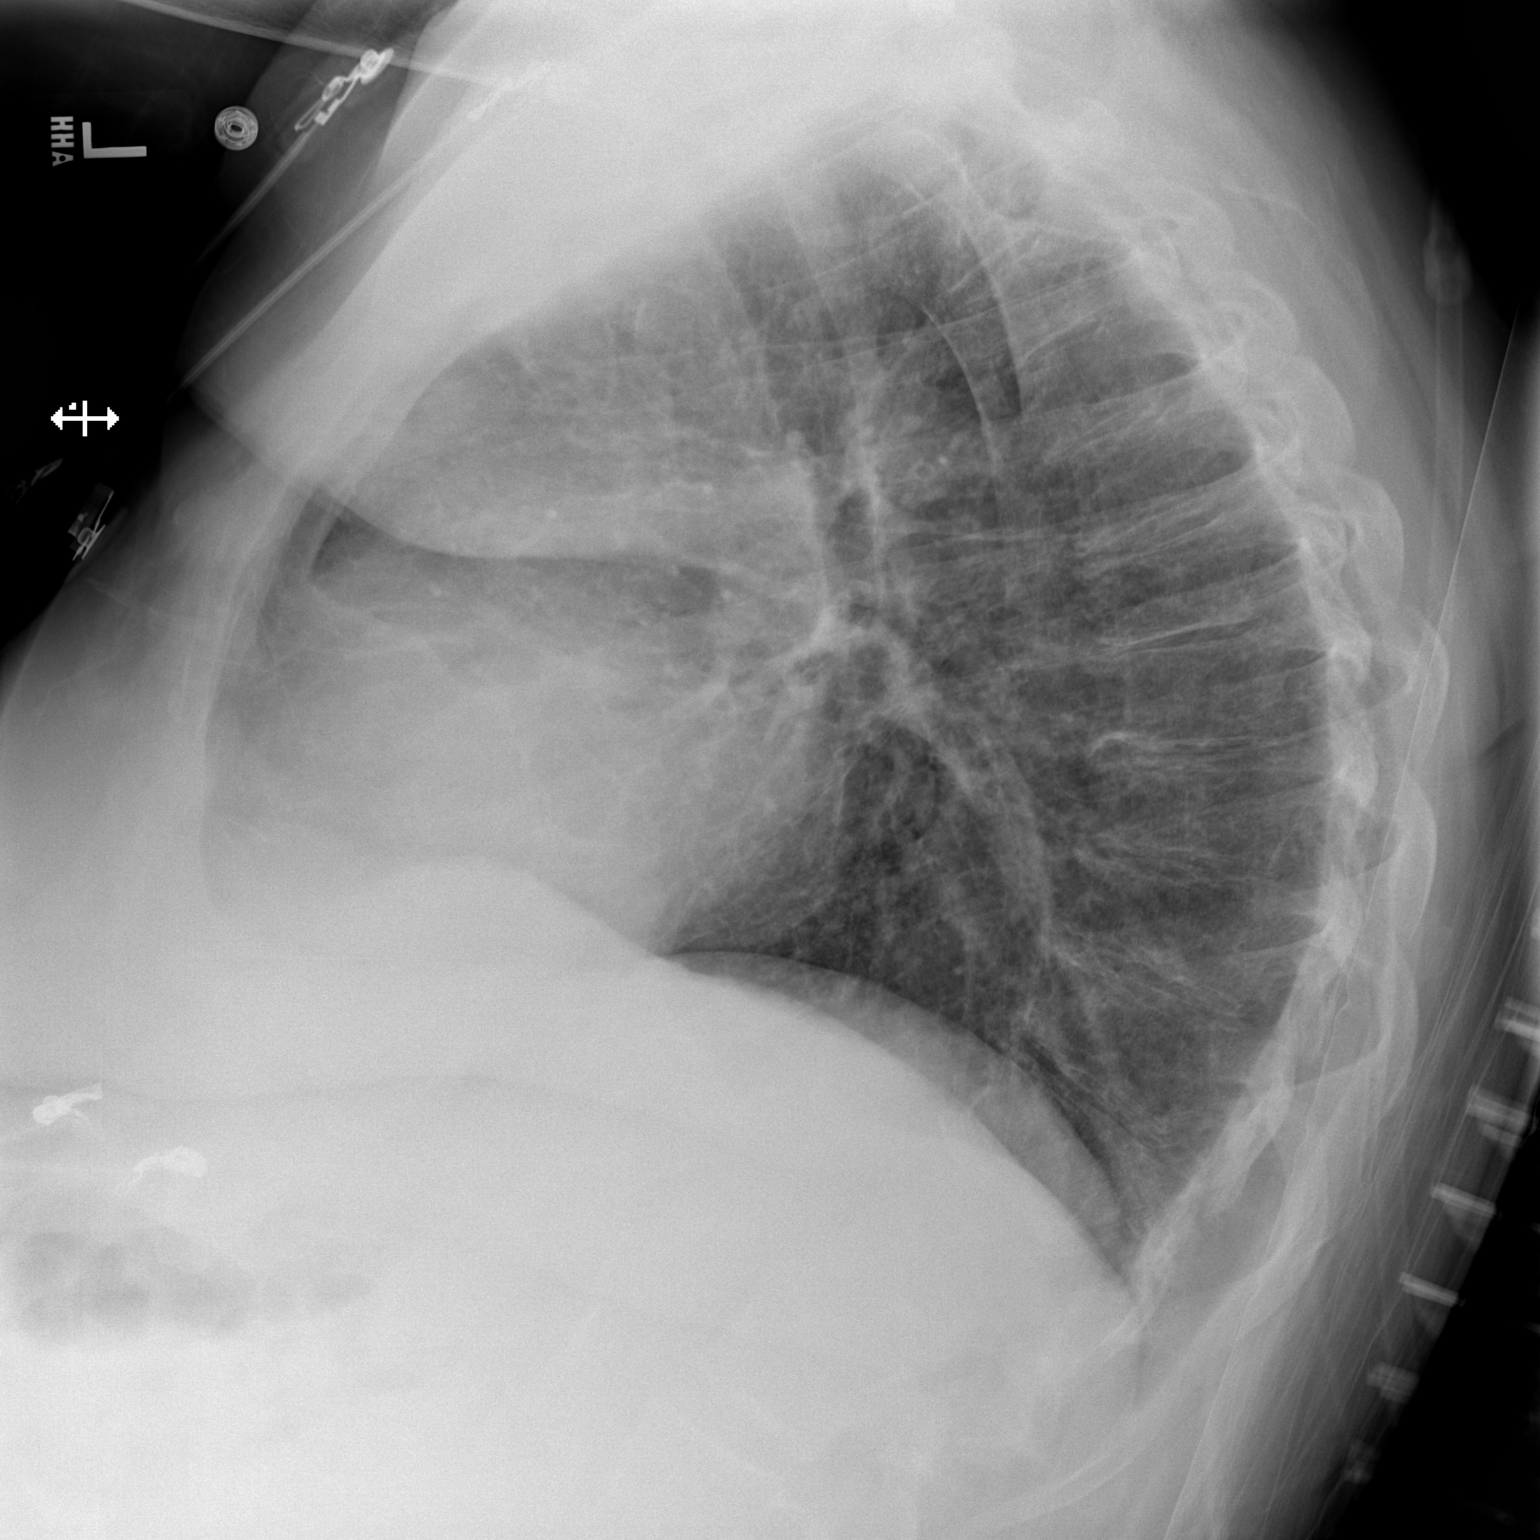

[x chest ap]
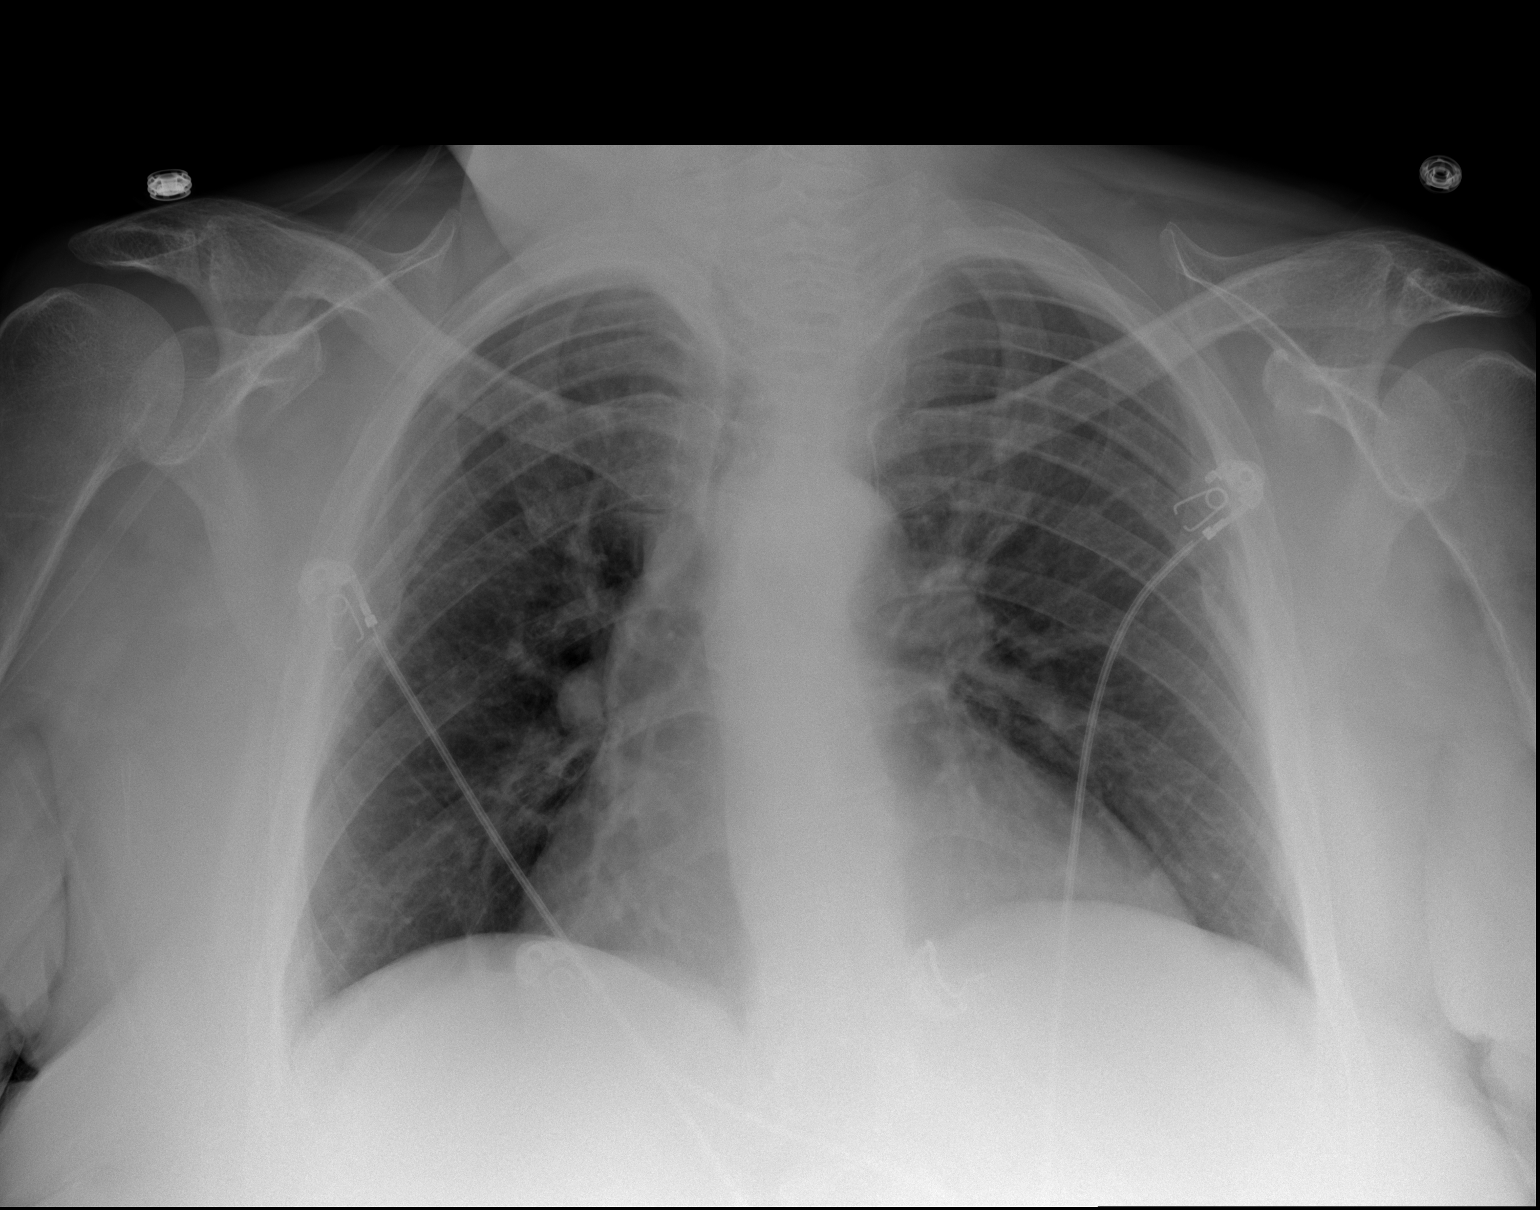

[2 of 2 positions shown; findings below may reference images not displayed]

FINDINGS: There is cardiomegaly without edema. Lungs are clear. No
pneumothorax or pleural effusion. No acute bony abnormality.
IMPRESSION: Cardiomegaly without acute disease.

## 2019-03-13 ENCOUNTER — Ambulatory Visit: Payer: Medicare Other | Admitting: Orthopaedic Surgery

## 2019-03-15 ENCOUNTER — Other Ambulatory Visit: Payer: Self-pay | Admitting: Infectious Diseases

## 2019-03-16 ENCOUNTER — Other Ambulatory Visit: Payer: Self-pay

## 2019-03-16 ENCOUNTER — Telehealth: Payer: Self-pay

## 2019-03-16 NOTE — Telephone Encounter (Signed)
Patient called pharmacy staff requesting refills on Biktarvy. States she has been in and out of hospital and is only down to 5 tabs.  Scheduled patient office visit and lab same day appt on 3/6. Patient is having issues with transportation; will have Juliann Pulse, Glass blower/designer help set transportation up. Phone number and address confirmed. [place]

## 2019-03-19 ENCOUNTER — Ambulatory Visit: Payer: Medicare Other | Admitting: Orthopaedic Surgery

## 2019-03-20 ENCOUNTER — Telehealth: Payer: Self-pay | Admitting: Infectious Diseases

## 2019-03-20 ENCOUNTER — Ambulatory Visit: Payer: Medicare Other | Admitting: Orthopaedic Surgery

## 2019-03-20 NOTE — Telephone Encounter (Signed)
   LILIT CINELLI DOB: [birth year] MRN: [id number]   RIDER WAIVER AND RELEASE OF LIABILITY  For purposes of improving physical access to our facilities, [facility] is pleased to partner with third parties to provide [facility] patients or other authorized individuals the option of convenient, on-demand ground transportation services (the [place]") through use of the technology service that enables users to request on-demand ground transportation from independent third-party providers.  By opting to use and accept these [employer], I, the undersigned, hereby agree on behalf of myself, and on behalf of any minor child using the [employer] for whom I am the parent or legal guardian, as follows:  1. Government social research officer provided to me are provided by independent third-party transportation providers who are not [employer] or employees and who are unaffiliated with [employer]. 2. [facility] is neither a transportation carrier nor a common or public carrier. 3. [facility] has no control over the quality or safety of the transportation that occurs as a result of the [employer]. 4. [facility] cannot guarantee that any third-party transportation provider will complete any arranged transportation service. 5. [facility] makes no representation, warranty, or guarantee regarding the reliability, timeliness, quality, safety, suitability, or availability of any of the Transport Services or that they will be error free. 6. I fully understand that traveling by vehicle involves risks and dangers of serious bodily injury, including permanent disability, paralysis, and death. I agree, on behalf of myself and on behalf of any minor child using the Transport Services for whom I am the parent or legal guardian, that the entire risk arising out of my use of the [employer] remains solely with me, to the maximum extent permitted under applicable law. 7. The [employer] are provided "as is" and "as available." [facility] disclaims all representations and warranties, express, implied or statutory, not expressly set out in these terms, including the implied warranties of merchantability and fitness for a particular purpose. 8. I hereby waive and release [facility], its agents, employees, officers, directors, representatives, insurers, attorneys, assigns, successors, subsidiaries, and affiliates from any and all past, present, or future claims, demands, liabilities, actions, causes of action, or suits of any kind directly or indirectly arising from acceptance and use of the [employer]. 9. I further waive and release [facility] and its affiliates from all present and future liability and responsibility for any injury or death to persons or damages to property caused by or related to the use of the [employer]. 10. I have read this Waiver and Release of Liability, and I understand the terms used in it and their legal significance. This Waiver is freely and voluntarily given with the understanding that my right (as well as the right of any minor child for whom I am the parent or legal guardian using the [employer]) to legal recourse against [place] in connection with the [employer] is knowingly surrendered in return for use of these services.   I attest that I read the consent document to Nadean Corwin, gave Ms. Decelle the opportunity to ask questions and answered the questions asked (if any). I affirm that Sunday Spillers Bhat then provided consent for she's participation in this program.     Ginette Otto

## 2019-03-26 ENCOUNTER — Encounter: Payer: Self-pay | Admitting: Orthopaedic Surgery

## 2019-03-26 ENCOUNTER — Ambulatory Visit (INDEPENDENT_AMBULATORY_CARE_PROVIDER_SITE_OTHER): Payer: Medicare Other | Admitting: Orthopaedic Surgery

## 2019-03-26 ENCOUNTER — Ambulatory Visit: Payer: Self-pay

## 2019-03-26 ENCOUNTER — Other Ambulatory Visit: Payer: Self-pay

## 2019-03-26 VITALS — Ht 70.0 in | Wt 209.0 lb

## 2019-03-26 DIAGNOSIS — S82891S Other fracture of right lower leg, sequela: Secondary | ICD-10-CM

## 2019-03-26 DIAGNOSIS — M25551 Pain in right hip: Secondary | ICD-10-CM | POA: Diagnosis not present

## 2019-03-26 DIAGNOSIS — M25571 Pain in right ankle and joints of right foot: Secondary | ICD-10-CM

## 2019-03-26 DIAGNOSIS — S72001D Fracture of unspecified part of neck of right femur, subsequent encounter for closed fracture with routine healing: Secondary | ICD-10-CM | POA: Diagnosis not present

## 2019-03-26 NOTE — Progress Notes (Signed)
Office Visit Note   Patient: [name]           Date of Birth: 07-30-1965           MRN: [id number] Visit Date: 03/26/2019              Requested by: [name], PA-C [place],  [place] [address] PCP: [name], PA-C   Assessment & Plan: Visit Diagnoses:  1. Pain in right ankle and joints of right foot   2. Pain in right hip   3. Closed fracture of right ankle, sequela   4. Closed fracture of neck of right femur with routine healing, subsequent encounter     Plan: Impression is healed right hip and ankle fractures.  I believe that the main issue is pitting edema.  She has had a lot of surgeries and she has a poor circulation at baseline.  I do recommend compression socks at all times.  From my standpoint the fractures have gone on to healing and I do recommend continue with physical therapy and rehab.  We will see her back as needed.  Follow-Up Instructions: Return if symptoms worsen or fail to improve.   Orders:  Orders Placed This Encounter  Procedures  . XR Ankle Complete Right  . XR HIP UNILAT W OR W/O PELVIS 2-3 VIEWS RIGHT   No orders of the defined types were placed in this encounter.     Procedures: No procedures performed   Clinical Data: No additional findings.   Subjective: Chief Complaint  Patient presents with  . Right Leg - Pain    [name] returns today for follow-up of her right hip and ankle fracture.  She is overall doing well.  Her main complaint is that she has had more swelling in her right leg.  She has tried some compression socks but they were the wrong size and she is waiting on the correct size.  She has no real complaints otherwise.   Review of Systems   Objective: Vital Signs: Ht _0  (1.778 m)   Wt 209 lb (94.8 kg)   LMP 11/16/2010   BMI 29.99 kg/m   Physical Exam  Ortho Exam Right hip and ankle and lower leg show fully healed surgical scars.  She does have 2+ pitting edema.  No signs  of infection. Specialty Comments:  No specialty comments available.  Imaging: XR HIP UNILAT W OR W/O PELVIS 2-3 VIEWS RIGHT  Result Date: 03/26/2019 Healed intertrochanteric fracture without hardware complication.  XR Ankle Complete Right  Result Date: 03/26/2019 Healed trimalleolar ankle fracture with disuse osteopenia    PMFS History: Patient Active Problem List   Diagnosis Date Noted  . Pressure injury of skin 12/26/2018  . Displaced intertrochanteric fracture of right femur, initial encounter for closed fracture ([place]) 12/24/2018  . Closed right ankle fracture 07/02/2018  . Anemia of chronic disease 07/02/2018  . Displaced bimalleolar fracture of right ankle, closed, initial encounter 06/02/2018  . Avascular necrosis of bone of left hip ([place]) 04/03/2018  . Mucopurulent chronic bronchitis ([place]) 03/11/2018  . Allergic rhinitis 03/11/2018  . Anxiety and depression 12/29/2017  . Acute respiratory failure with hypoxia ([place]) 12/28/2017  . Syncope 08/29/2017  . DKA (diabetic ketoacidoses) ([place]) 03/02/2017  . COPD exacerbation ([place]) 02/23/2017  . Acute on chronic respiratory failure with hypoxia and hypercapnia ([place]) 08/19/2016  . Leukocytosis 08/19/2016  . CKD (chronic kidney disease), stage III ([place]) 08/19/2016  . Altered mental status 08/19/2016  .  Asthma exacerbation 06/18/2016  . Insomnia 06/12/2016  . Difficult intravenous access   . Acute on chronic respiratory failure with hypoxia ([place]) 05/19/2016  . NSTEMI (non-ST elevated myocardial infarction) ([place]) 05/19/2016  . Acute metabolic encephalopathy 21/30/8657  . Hyperlipidemia   . Class 3 obesity due to excess calories with serious comorbidity and body mass index (BMI) of 40.0 to 44.9 in adult   . Obesity hypoventilation syndrome ([place])   . Diastolic dysfunction 84/69/6295  . Diabetic neuropathy ([place]) 11/10/2014  . Fracture of distal femur ([place]) 07/08/2014  . Osteoporosis 07/08/2014  . Femur fracture, left ([place])  07/08/2014  . Colles' fracture of left radius   . Acute kidney injury superimposed on CKD ([place]) 05/03/2014  . Left hip pain 05/03/2014  . Perimenopausal symptoms 02/13/2012  . Hyponatremia 12/29/2011  . Asthma exacerbation attacks 12/28/2011  . Bronchitis 12/28/2011  . Repeated falls 07/30/2011  . Hip fracture, left ([place]) 07/16/2011  . Femoral neck fracture ([place]) 07/16/2011  . Tobacco abuse 04/14/2010  . Hyperlipidemia associated with type 2 diabetes mellitus ([place]) 12/23/2008  . COPD (chronic obstructive pulmonary disease) ([place]) 11/29/2008  . THRUSH 05/20/2008  . Human immunodeficiency virus (HIV) disease ([place]) 04/19/2008  . Diabetes mellitus type 2 with complications, uncontrolled ([place]) 04/19/2008  . Mood disorder ([place]) 04/19/2008  . Hereditary and idiopathic peripheral neuropathy 04/19/2008  . Hypertension associated with diabetes ([place]) 04/19/2008   Past Medical History:  Diagnosis Date  . Acute metabolic encephalopathy 2/84/1324  . Acute on chronic respiratory failure with hypoxia ([place]) 05/19/2016  . Anxiety   . ARF (acute renal failure) ([place]) 05/03/2014  . Arthritis    LEFT HIP  . Asthma    HOSPITALIZED WITH EXCERBATION OF ASTHMA - AND BRONCHITIS AND THE FLU DEC 2013  . Asthma exacerbation attacks 12/28/2011  . Bronchitis 12/28/2011  . CAP (community acquired pneumonia) 07/25/2012  . Chronic kidney disease (CKD), stage IV (severe) ([place]) 11/10/2014  . Class 3 obesity due to excess calories with serious comorbidity and body mass index (BMI) of 40.0 to 44.9 in adult   . Colles' fracture of left radius   . COPD (chronic obstructive pulmonary disease) ([place])   . Depression   . Diabetes mellitus    ON INSULIN AND ORAL MEDICATIONS  . Diabetes mellitus type 2 with complications, uncontrolled ([place]) 04/19/2008   Qualifier: Diagnosis of  By: Tomma Lightning MD, Claiborne Billings     . Diabetic foot infection ([place]) 11/09/2014  . Diabetic ketoacidosis without coma associated with type 2 diabetes mellitus  ([place])   . Diabetic neuropathy ([place]) 11/10/2014  . Diarrhea 08/30/2008   Qualifier: Diagnosis of  By: Tomma Lightning MD, Claiborne Billings    . Diastolic dysfunction 40/01/270  . Difficult intravenous access   . DKA, type 2 ([place]) 04/04/2016  . Dyslipidemia 12/23/2008   Qualifier: Diagnosis of  By: Tomma Lightning MD, Claiborne Billings    . Essential hypertension 04/19/2008   Qualifier: Diagnosis of  By: Tomma Lightning MD, Claiborne Billings    . FATIGUE 07/12/2008   Qualifier: Diagnosis of  By: Tomma Lightning MD, Claiborne Billings    . Femoral neck fracture ([place]) 07/16/2011  . Femur fracture, left ([place]) 07/08/2014  . Fever   . Fracture of distal femur ([place]) 07/08/2014  . GERD (gastroesophageal reflux disease)   . Hereditary and idiopathic peripheral neuropathy 04/19/2008   Qualifier: Diagnosis of  By: Tomma Lightning MD, Claiborne Billings    . Hip fracture, left ([place]) 07/16/2011  . Hip pain   . HIV infection ([place]) 2000  . Human immunodeficiency virus (HIV) disease (  [place]) 04/19/2008   [id number] +   . Hyperlipidemia   . Hypertension   . Hyponatremia 12/29/2011  . Influenza A 12/29/2011  . Influenza B   . Insomnia 06/12/2016  . Left hip pain 05/03/2014  . Mood disorder ([place]) 04/19/2008   Qualifier: Diagnosis of  By: Tomma Lightning MD, Claiborne Billings    . Neuropathy    NEUROPATHY HANDS AND FEET  . NSTEMI (non-ST elevated myocardial infarction) ([place]) 05/19/2016  . Obesity hypoventilation syndrome ([place])   . Osteoporosis 07/08/2014  . Pain    SEVERE PAIN LEFT HIP - HX OF LEFT HIP PINNING JULY 2013  . Perimenopausal symptoms 02/13/2012   LMP around 2011. On estrace and provera since around 2012 for hot flashes, mood swings. Estrace 2 mg daily, Provera 2.5 mg for 5 days each month.   . Repeated falls 07/30/2011  . Sepsis ([place]) 04/04/2016  . Shortness of breath    ALLERGIES ARE "ACTING UP"  . SOB (shortness of breath)   . THRUSH 05/20/2008   Qualifier: Diagnosis of  By: Tomma Lightning MD, Claiborne Billings      . Tobacco use disorder 04/14/2010    Family History  Problem Relation Age of Onset  . Diabetes Mother   . Hypertension  Mother   . Vision loss Mother   . Heart disease Mother   . Hypertension Father   . Thyroid disease Neg Hx     Past Surgical History:  Procedure Laterality Date  . AMPUTATION Left 11/18/2014   Procedure: LEFT BELOW KNEE AMPUTATION;  Surgeon: Leandrew Koyanagi, MD;  Location: [facility];  Service: Orthopedics;  Laterality: Left;  . CAST APPLICATION Left 06/08/1273   Procedure: CAST APPLICATION (FIBERGLASS);  Surgeon: Latanya Maudlin, MD;  Location: [facility];  Service: Orthopedics;  Laterality: Left;  CLOSED REDUCTION OF LEFT COLLES FRACTURE WITH SHORT ARM CAST  . EXTERNAL FIXATION LEG Right 06/02/2018   Procedure: EXTERNAL FIXATION LEG;  Surgeon: Leandrew Koyanagi, MD;  Location: [facility];  Service: Orthopedics;  Laterality: Right;  . EXTERNAL FIXATION REMOVAL Right 07/01/2018   Procedure: REMOVAL EXTERNAL FIXATION LEG;  Surgeon: Leandrew Koyanagi, MD;  Location: [facility];  Service: Orthopedics;  Laterality: Right;  . HARDWARE REMOVAL Left 05/05/2014   Procedure: HARDWARE REMOVAL LEFT HIP;  Surgeon: Latanya Maudlin, MD;  [facility];  Service: Orthopedics;  Laterality: Left;  REMOVAL BIOMET 6.5-8.0 CANNULATED SCREW  . HIP ARTHROPLASTY Left 05/05/2014   Procedure: ARTHROPLASTY OPEN REDUCTION INTERNAL FIXATION LEFT HIP AND REMOVAL OF TWO CANNULATED SCREW;  Surgeon: Latanya Maudlin, MD;  [facility];  Service: Orthopedics;  Laterality: Left;  . HIP PINNING,CANNULATED  07/16/2011   Procedure: CANNULATED HIP PINNING;  Surgeon: Gearlean Alf, MD;  [facility];  Service: Orthopedics;  Laterality: Left;  . HIP PINNING,CANNULATED Left 04/09/2012   Procedure: CANNULATED HIP PINNING AND HARDWARE REVISION;  Surgeon: Gearlean Alf, MD;  [facility];  Service: Orthopedics;  Laterality: Left;  . I & D EXTREMITY Left 11/16/2014   Procedure:  DEBRIDEMENT OF LEFT FOOT POSSIBLE APPLICATION OF INTEGRIA AND VAC ;  Surgeon: Irene Limbo, MD;  Location: [facility];  Service: Plastics;  Laterality: Left;  . I & D EXTREMITY Right  07/01/2018   Procedure: RIGHT ANKLE EX FIX REMOVAL, IRRIGATION AND DEBRIDEMENT AND SHORT LEG CAST;  Surgeon: Leandrew Koyanagi, MD;  Location: [facility];  Service: Orthopedics;  Laterality: Right;  . INTRAMEDULLARY (IM) NAIL INTERTROCHANTERIC Right 12/25/2018   Procedure: INTRAMEDULLARY (IM) NAIL INTERTROCHANTRIC;  Surgeon: Erlinda Hong,  Marylynn Pearson, MD;  Location: [facility];  Service: Orthopedics;  Laterality: Right;  . PERIPHERAL VASCULAR CATHETERIZATION N/A 11/15/2014   Procedure: Abdominal Aortogram;  Surgeon: Conrad [place] Bay, MD;  Location: [facility];  Service: Cardiovascular;  Laterality: N/A;  . PERIPHERAL VASCULAR CATHETERIZATION  11/15/2014   Procedure: Lower Extremity Angiography;  Surgeon: Conrad Los Altos, MD;  Location: [facility];  Service: Cardiovascular;;  . PERIPHERAL VASCULAR CATHETERIZATION Left 11/15/2014   Procedure: Peripheral Vascular Intervention;  Surgeon: Conrad Virgilina, MD;  Location: [facility];  Service: Cardiovascular;  Laterality: Left;  popliteal artery stenting   Social History   Occupational History  . Not on file  Tobacco Use  . Smoking status: Former Smoker    Packs/day: 1.50    Years: 38.00    Pack years: 57.00    Types: E-cigarettes, Cigarettes    Start date: 03/10/1981    Quit date: 06/01/2018    Years since quitting: 0.8  . Smokeless tobacco: Never Used  . Tobacco comment: 3/4 of a pack per day 03/11/18  Substance and Sexual Activity  . Alcohol use: No    Alcohol/week: 0.0 standard drinks    Comment: no drink since 2008  . Drug use: No    Comment: hx of crack/cocaine, clean 8 years  . Sexual activity: Not Currently    Partners: Male

## 2019-04-06 ENCOUNTER — Other Ambulatory Visit: Payer: Self-pay | Admitting: Pulmonary Disease

## 2019-04-06 DIAGNOSIS — J411 Mucopurulent chronic bronchitis: Secondary | ICD-10-CM

## 2019-04-07 ENCOUNTER — Ambulatory Visit: Payer: Medicare Other | Admitting: Infectious Diseases

## 2019-04-09 ENCOUNTER — Telehealth: Payer: Self-pay | Admitting: Pulmonary Disease

## 2019-04-09 ENCOUNTER — Telehealth: Payer: Medicare Other | Admitting: Pulmonary Disease

## 2019-04-09 NOTE — Telephone Encounter (Signed)
04/09/2019  Patient scheduled for mitral video visit with me today.  We are unable to reach the patient.  Patient did not log on.  I left a voicemail with the patient for her to contact her office or send [place] a MyChart message to get rescheduled.  Wyn Quaker, FNP

## 2019-04-15 ENCOUNTER — Other Ambulatory Visit: Payer: Self-pay | Admitting: Infectious Diseases

## 2019-04-28 ENCOUNTER — Ambulatory Visit: Payer: Medicare Other | Admitting: Endocrinology

## 2019-05-02 DEATH — deceased

## 2021-01-31 ENCOUNTER — Telehealth: Payer: Self-pay

## 2021-01-31 NOTE — Telephone Encounter (Signed)
Patient last seen 05/2018 - called to offer appointment, no answer. Left HIPAA compliant voicemail requesting callback.   Beryle Flock, RN

## 2021-04-17 ENCOUNTER — Telehealth: Payer: Self-pay

## 2021-04-17 NOTE — Telephone Encounter (Signed)
Called patient to offer appointment, no answer. Left HIPAA compliant voicemail requesting callback.  ? ?Referral sent to state bridge counselor 03/2021. ? ?Shaquitta Burbridge D Mushka Laconte, RN ? ?

## 2021-06-12 ENCOUNTER — Encounter: Payer: Self-pay | Admitting: Infectious Diseases
